# Patient Record
Sex: Male | Born: 1965 | Race: White | Hispanic: No | Smoking: Current every day smoker
Health system: Southern US, Community
[De-identification: ages and names within clinical notes are randomized; demographics above are authoritative.]

## PROBLEM LIST (undated history)

## (undated) ENCOUNTER — Emergency Department (HOSPITAL_COMMUNITY)

## (undated) DIAGNOSIS — T783XXA Angioneurotic edema, initial encounter: Secondary | ICD-10-CM

## (undated) DIAGNOSIS — F32A Depression, unspecified: Secondary | ICD-10-CM

## (undated) DIAGNOSIS — I219 Acute myocardial infarction, unspecified: Secondary | ICD-10-CM

## (undated) DIAGNOSIS — R519 Headache, unspecified: Secondary | ICD-10-CM

## (undated) DIAGNOSIS — R6881 Early satiety: Secondary | ICD-10-CM

## (undated) DIAGNOSIS — M25559 Pain in unspecified hip: Secondary | ICD-10-CM

## (undated) DIAGNOSIS — I10 Essential (primary) hypertension: Secondary | ICD-10-CM

## (undated) DIAGNOSIS — M1990 Unspecified osteoarthritis, unspecified site: Secondary | ICD-10-CM

## (undated) DIAGNOSIS — M549 Dorsalgia, unspecified: Secondary | ICD-10-CM

## (undated) DIAGNOSIS — M1611 Unilateral primary osteoarthritis, right hip: Secondary | ICD-10-CM

## (undated) DIAGNOSIS — T884XXA Failed or difficult intubation, initial encounter: Secondary | ICD-10-CM

## (undated) DIAGNOSIS — M5030 Other cervical disc degeneration, unspecified cervical region: Secondary | ICD-10-CM

## (undated) DIAGNOSIS — I2510 Atherosclerotic heart disease of native coronary artery without angina pectoris: Secondary | ICD-10-CM

## (undated) DIAGNOSIS — K760 Fatty (change of) liver, not elsewhere classified: Secondary | ICD-10-CM

## (undated) DIAGNOSIS — T464X5A Adverse effect of angiotensin-converting-enzyme inhibitors, initial encounter: Secondary | ICD-10-CM

## (undated) DIAGNOSIS — F419 Anxiety disorder, unspecified: Secondary | ICD-10-CM

## (undated) DIAGNOSIS — J45909 Unspecified asthma, uncomplicated: Secondary | ICD-10-CM

## (undated) DIAGNOSIS — E119 Type 2 diabetes mellitus without complications: Secondary | ICD-10-CM

## (undated) DIAGNOSIS — G4730 Sleep apnea, unspecified: Secondary | ICD-10-CM

## (undated) DIAGNOSIS — M542 Cervicalgia: Secondary | ICD-10-CM

## (undated) DIAGNOSIS — K219 Gastro-esophageal reflux disease without esophagitis: Secondary | ICD-10-CM

## (undated) DIAGNOSIS — I509 Heart failure, unspecified: Secondary | ICD-10-CM

## (undated) DIAGNOSIS — F329 Major depressive disorder, single episode, unspecified: Secondary | ICD-10-CM

## (undated) DIAGNOSIS — M5020 Other cervical disc displacement, unspecified cervical region: Secondary | ICD-10-CM

## (undated) DIAGNOSIS — R0600 Dyspnea, unspecified: Secondary | ICD-10-CM

## (undated) DIAGNOSIS — D649 Anemia, unspecified: Secondary | ICD-10-CM

## (undated) DIAGNOSIS — J449 Chronic obstructive pulmonary disease, unspecified: Secondary | ICD-10-CM

## (undated) DIAGNOSIS — H04129 Dry eye syndrome of unspecified lacrimal gland: Secondary | ICD-10-CM

## (undated) DIAGNOSIS — H6690 Otitis media, unspecified, unspecified ear: Secondary | ICD-10-CM

## (undated) DIAGNOSIS — F1910 Other psychoactive substance abuse, uncomplicated: Secondary | ICD-10-CM

## (undated) DIAGNOSIS — E785 Hyperlipidemia, unspecified: Secondary | ICD-10-CM

## (undated) DIAGNOSIS — R011 Cardiac murmur, unspecified: Secondary | ICD-10-CM

## (undated) DIAGNOSIS — R091 Pleurisy: Secondary | ICD-10-CM

## (undated) DIAGNOSIS — R51 Headache: Secondary | ICD-10-CM

## (undated) DIAGNOSIS — I455 Other specified heart block: Secondary | ICD-10-CM

## (undated) DIAGNOSIS — T7840XA Allergy, unspecified, initial encounter: Secondary | ICD-10-CM

## (undated) HISTORY — DX: Essential (primary) hypertension: I10

## (undated) HISTORY — PX: INCISION / DRAINAGE HAND / FINGER: SUR695

## (undated) HISTORY — DX: Cardiac murmur, unspecified: R01.1

## (undated) HISTORY — DX: Unilateral primary osteoarthritis, right hip: M16.11

## (undated) HISTORY — DX: Hyperlipidemia, unspecified: E78.5

## (undated) HISTORY — DX: Adverse effect of angiotensin-converting-enzyme inhibitors, initial encounter: T46.4X5A

## (undated) HISTORY — DX: Other psychoactive substance abuse, uncomplicated: F19.10

## (undated) HISTORY — DX: Sleep apnea, unspecified: G47.30

## (undated) HISTORY — DX: Angioneurotic edema, initial encounter: T78.3XXA

## (undated) HISTORY — DX: Dry eye syndrome of unspecified lacrimal gland: H04.129

## (undated) HISTORY — DX: Allergy, unspecified, initial encounter: T78.40XA

## (undated) HISTORY — DX: Early satiety: R68.81

## (undated) HISTORY — DX: Adverse effect of angiotensin-converting-enzyme inhibitors, initial encounter: T78.3XXA

## (undated) HISTORY — DX: Unspecified asthma, uncomplicated: J45.909

---

## 2001-11-03 ENCOUNTER — Encounter: Payer: Self-pay | Admitting: Family Medicine

## 2001-11-03 ENCOUNTER — Ambulatory Visit (HOSPITAL_COMMUNITY): Admission: RE | Admit: 2001-11-03 | Discharge: 2001-11-03 | Payer: Self-pay | Admitting: Family Medicine

## 2005-08-25 ENCOUNTER — Ambulatory Visit: Payer: Self-pay | Admitting: Psychiatry

## 2005-08-25 ENCOUNTER — Inpatient Hospital Stay (HOSPITAL_COMMUNITY): Admission: EM | Admit: 2005-08-25 | Discharge: 2005-08-28 | Payer: Self-pay | Admitting: Psychiatry

## 2006-06-20 ENCOUNTER — Emergency Department (HOSPITAL_COMMUNITY): Admission: EM | Admit: 2006-06-20 | Discharge: 2006-06-20 | Payer: Self-pay | Admitting: Emergency Medicine

## 2006-06-21 ENCOUNTER — Inpatient Hospital Stay (HOSPITAL_COMMUNITY): Admission: AD | Admit: 2006-06-21 | Discharge: 2006-06-24 | Payer: Self-pay | Admitting: Psychiatry

## 2006-06-21 ENCOUNTER — Ambulatory Visit: Payer: Self-pay | Admitting: Psychiatry

## 2011-08-24 ENCOUNTER — Emergency Department (HOSPITAL_COMMUNITY)
Admission: EM | Admit: 2011-08-24 | Discharge: 2011-08-24 | Disposition: A | Discharge status: Home / Self Care | Payer: BC Managed Care – PPO | Attending: Emergency Medicine | Admitting: Emergency Medicine

## 2011-08-24 ENCOUNTER — Other Ambulatory Visit: Payer: Self-pay

## 2011-08-24 DIAGNOSIS — M436 Torticollis: Secondary | ICD-10-CM | POA: Insufficient documentation

## 2011-08-24 DIAGNOSIS — Z136 Encounter for screening for cardiovascular disorders: Secondary | ICD-10-CM | POA: Insufficient documentation

## 2011-08-24 DIAGNOSIS — F17200 Nicotine dependence, unspecified, uncomplicated: Secondary | ICD-10-CM | POA: Insufficient documentation

## 2011-08-24 MED ORDER — CYCLOBENZAPRINE HCL 10 MG PO TABS: 10.0000 mg | ORAL_TABLET | Freq: Three times a day (TID) | ORAL | Status: AC | PRN

## 2011-08-24 MED ORDER — OXYCODONE-ACETAMINOPHEN 5-325 MG PO TABS
1.0000 | ORAL_TABLET | Freq: Once | ORAL | Status: AC
  Administered 2011-08-24: 1 via ORAL
  Filled 2011-08-24: qty 1

## 2011-08-24 MED ORDER — NAPROXEN 500 MG PO TABS: 500.0000 mg | ORAL_TABLET | Freq: Two times a day (BID) | ORAL | Status: AC

## 2011-08-24 MED ORDER — CYCLOBENZAPRINE HCL 10 MG PO TABS
10.0000 mg | ORAL_TABLET | Freq: Once | ORAL | Status: AC
  Administered 2011-08-24: 10 mg via ORAL
  Filled 2011-08-24: qty 1

## 2011-08-24 MED ORDER — OXYCODONE-ACETAMINOPHEN 5-325 MG PO TABS: 1.0000 | ORAL_TABLET | ORAL | Status: AC | PRN

## 2011-08-24 NOTE — ED Provider Notes (Signed)
History     CSN: [id number]  Arrival date & time 08/24/11  1629   First MD Initiated Contact with Patient 08/24/11 1746      Chief Complaint  Patient presents with  . Neck Pain    (Consider location/radiation/quality/duration/timing/severity/associated sxs/prior treatment) HPI Comments: Patient c/o pain to the right neck for several days.  States the pain began after he was shoveling.  Also states the pain radiates to his right jaw. Pain is worsened by movement of the arm or rotation of his neck.  HE denies chest pain, sweating, nausea or shortness of breath  Patient is a 45 y.o. male presenting with neck injury. The history is provided by the patient.  Neck Injury This is a new problem. The current episode started in the past 7 days. The problem occurs constantly. The problem has been unchanged. Associated symptoms include arthralgias and neck pain. Pertinent negatives include no abdominal pain, chest pain, congestion, fever, headaches, joint swelling, nausea, numbness, rash, sore throat, vomiting or weakness. Exacerbated by: movement and palpation. He has tried nothing for the symptoms. The treatment provided no relief.    History reviewed. No pertinent past medical history.  History reviewed. No pertinent past surgical history.  History reviewed. No pertinent family history.  History  Substance Use Topics  . Smoking status: Current Everyday Smoker -- 1.0 packs/day  . Smokeless tobacco: Not on file  . Alcohol Use: Yes      Review of Systems  Constitutional: Negative for fever and appetite change.  HENT: Positive for neck pain. Negative for congestion, sore throat and facial swelling.   Eyes: Negative for visual disturbance.  Cardiovascular: Negative for chest pain.  Gastrointestinal: Negative for nausea, vomiting and abdominal pain.  Musculoskeletal: Positive for arthralgias. Negative for back pain and joint swelling.  Skin: Negative for rash.  Neurological: Negative  for dizziness, facial asymmetry, weakness, numbness and headaches.  All other systems reviewed and are negative.    Allergies  Review of patient's allergies indicates no known allergies.  Home Medications  No current outpatient prescriptions on file.  BP 151/93  Pulse 77  Temp(Src) 98.1 F (36.7 C) (Oral)  Resp 20  Ht 5\' 6"  (1.676 m)  Wt 175 lb (79.379 kg)  BMI 28.25 kg/m2  SpO2 95%  Physical Exam  Nursing note and vitals reviewed. Constitutional: He is oriented to person, place, and time. He appears well-developed and well-nourished. No distress.  HENT:  Head: Normocephalic and atraumatic.  Mouth/Throat: Oropharynx is clear and moist.  Eyes: EOM are normal. Pupils are equal, round, and reactive to light.  Neck: Normal range of motion. Neck supple. Muscular tenderness present. No spinous process tenderness present. No rigidity. No edema, no erythema and normal range of motion present. No Brudzinski's sign and no Kernig's sign noted.  Cardiovascular: Normal rate, regular rhythm and normal heart sounds.   Pulmonary/Chest: Effort normal and breath sounds normal. No respiratory distress. He exhibits no tenderness.  Abdominal: Soft. He exhibits no distension. There is no tenderness.  Musculoskeletal: Normal range of motion. He exhibits tenderness. He exhibits no edema.       Cervical back: He exhibits tenderness. He exhibits normal range of motion, no bony tenderness, no swelling, no laceration, no spasm and normal pulse.       Back:  Lymphadenopathy:    He has no cervical adenopathy.  Neurological: He is alert and oriented to person, place, and time. No cranial nerve deficit. He exhibits normal muscle tone. Coordination normal.  Skin:  Skin is warm and dry.    ED Course  Procedures (including critical care time)          MDM     Date: 08/24/2011  Rate: 66  Rhythm: normal sinus rhythm  QRS Axis: normal  Intervals: normal  ST/T Wave abnormalities: normal   Conduction Disutrbances:none  Narrative Interpretation:   Old EKG Reviewed: none available   EKG read by Dr. Effie Shy   Patient has ttp of the right lateral neck.  Pain is also reproduced with abduction of the right arm.  No focal neuro deficits on exam.  No chest pain. Likely torticollis.  Randy Hebert L. [place], [place] 08/27/11 2048

## 2011-08-24 NOTE — ED Notes (Signed)
Pt a/ox4. Resp even and unlabored. NAD at this time. D/C instructions reviewed with pt. Pt verbalized understanding. Pt ambulated to lobby with steady gate.  

## 2011-08-24 NOTE — ED Notes (Signed)
Pt presents with neck pain. Pt denies injury but states it feels like a pulled muscle.

## 2011-08-28 NOTE — ED Provider Notes (Signed)
Medical screening examination/treatment/procedure(s) were performed by non-physician practitioner and as supervising physician I was immediately available for consultation/collaboration.  Jeramyah Goodpasture L Kosei Rhodes, MD 08/28/11 0103 

## 2013-04-20 ENCOUNTER — Emergency Department (HOSPITAL_COMMUNITY)
Admission: EM | Admit: 2013-04-20 | Discharge: 2013-04-20 | Disposition: A | Discharge status: Home / Self Care | Payer: Self-pay | Attending: Emergency Medicine | Admitting: Emergency Medicine

## 2013-04-20 ENCOUNTER — Encounter (HOSPITAL_COMMUNITY): Payer: Self-pay | Admitting: *Deleted

## 2013-04-20 DIAGNOSIS — F17200 Nicotine dependence, unspecified, uncomplicated: Secondary | ICD-10-CM | POA: Insufficient documentation

## 2013-04-20 DIAGNOSIS — H6690 Otitis media, unspecified, unspecified ear: Secondary | ICD-10-CM | POA: Insufficient documentation

## 2013-04-20 DIAGNOSIS — L299 Pruritus, unspecified: Secondary | ICD-10-CM | POA: Insufficient documentation

## 2013-04-20 DIAGNOSIS — H9190 Unspecified hearing loss, unspecified ear: Secondary | ICD-10-CM | POA: Insufficient documentation

## 2013-04-20 DIAGNOSIS — H9210 Otorrhea, unspecified ear: Secondary | ICD-10-CM | POA: Insufficient documentation

## 2013-04-20 DIAGNOSIS — IMO0002 Reserved for concepts with insufficient information to code with codable children: Secondary | ICD-10-CM | POA: Insufficient documentation

## 2013-04-20 DIAGNOSIS — H6691 Otitis media, unspecified, right ear: Secondary | ICD-10-CM

## 2013-04-20 DIAGNOSIS — Y9289 Other specified places as the place of occurrence of the external cause: Secondary | ICD-10-CM | POA: Insufficient documentation

## 2013-04-20 DIAGNOSIS — Y9389 Activity, other specified: Secondary | ICD-10-CM | POA: Insufficient documentation

## 2013-04-20 MED ORDER — AMOXICILLIN 500 MG PO CAPS: 500.0000 mg | ORAL_CAPSULE | Freq: Three times a day (TID) | ORAL | Status: DC

## 2013-04-20 MED ORDER — HYDROCODONE-ACETAMINOPHEN 5-325 MG PO TABS
2.0000 | ORAL_TABLET | Freq: Once | ORAL | Status: AC
  Administered 2013-04-20: 2 via ORAL
  Filled 2013-04-20: qty 2

## 2013-04-20 MED ORDER — HYDROCODONE-ACETAMINOPHEN 5-325 MG PO TABS: ORAL_TABLET | ORAL | Status: DC

## 2013-04-20 MED ORDER — AMOXICILLIN 250 MG PO CAPS
500.0000 mg | ORAL_CAPSULE | Freq: Once | ORAL | Status: AC
  Administered 2013-04-20: 500 mg via ORAL
  Filled 2013-04-20: qty 2

## 2013-04-20 NOTE — ED Provider Notes (Signed)
CSN: [id number]     Arrival date & time 04/20/13  0125 History     First MD Initiated Contact with Patient 04/20/13 0146     Chief Complaint  Patient presents with  . Otalgia    right earache for 1 week.   (Consider location/radiation/quality/duration/timing/severity/associated sxs/prior Treatment) HPI  Patient states a week ago he had been wearing ear plugs for work and had itching of his ear canals. He states he was using a Q-tip to scratch his ear canal and accidentally went too deep on the right side and felt a sharp pain and then saw some blood on his Q-tip. He states now the right ear is draining some clear/brown fluid that smells. He states he has pain in his right ear that is throbbing and intermittently he gets a sharp jab every 10-15 minutes it goes into his forehead. He denies any fever. He has noticed some decreased hearing in his right ear. He denies any pain with swallowing. He also states he notices his pain is worse when he lays down.  PCP Dr Sudie Bailey  History reviewed. No pertinent past medical history. History reviewed. No pertinent past surgical history. No family history on file. History  Substance Use Topics  . Smoking status: Current Every Day Smoker -- 1.00 packs/day  . Smokeless tobacco: Not on file  . Alcohol Use: Yes  employed  Review of Systems  All other systems reviewed and are negative.    Allergies  Review of patient's allergies indicates no known allergies.  Home Medications  none  BP 159/77  Pulse 87  Temp(Src) 98.3 F (36.8 C) (Oral)  Resp 20  Ht 5\' 6"  (1.676 m)  Wt 205 lb (92.987 kg)  BMI 33.1 kg/m2  SpO2 97%  Vital signs normal   Physical Exam  Nursing note and vitals reviewed. Constitutional: He is oriented to person, place, and time. He appears well-developed and well-nourished.  Non-toxic appearance. He does not appear ill. No distress.  HENT:  Head: Normocephalic and atraumatic.  Right Ear: External ear normal. Decreased  hearing is noted.  Left Ear: Hearing and external ear normal.  Nose: Nose normal. No mucosal edema or rhinorrhea.  Mouth/Throat: Oropharynx is clear and moist and mucous membranes are normal. No dental abscesses or edematous.  Pt has bilateral cauliflower ears. His canal is very angular. The left TM is normal, no obvious swelling or exudate in the canal. The right TM is bulging with a lot of white opaque purulent appearing fluid with air fluid level about 2/3 up the TM present with mild bulging. Canal does not appear to be swollen.    Eyes: Conjunctivae and EOM are normal. Pupils are equal, round, and reactive to light.  Neck: Normal range of motion and full passive range of motion without pain. Neck supple.  Pulmonary/Chest: Effort normal. No respiratory distress. He has no rhonchi. He exhibits no crepitus.  Abdominal: Normal appearance.  Musculoskeletal: Normal range of motion. He exhibits no edema and no tenderness.  Moves all extremities well.   Neurological: He is alert and oriented to person, place, and time. He has normal strength. No cranial nerve deficit.  Skin: Skin is warm, dry and intact. No rash noted. No erythema. No pallor.  Psychiatric: He has a normal mood and affect. His speech is normal and behavior is normal. His mood appears not anxious.    ED Course   Medications  HYDROcodone-acetaminophen (NORCO/VICODIN) 5-325 MG per tablet 2 tablet (not administered)  amoxicillin (AMOXIL) capsule  500 mg (not administered)     Procedures (including critical care time)  1. Otitis media, right    New Prescriptions   AMOXICILLIN (AMOXIL) 500 MG CAPSULE    Take 1 capsule (500 mg total) by mouth 3 (three) times daily.   HYDROCODONE-ACETAMINOPHEN (NORCO/VICODIN) 5-325 MG PER TABLET    Take 1 or 2 po Q 6hrs for pain    Plan discharge   Devoria Albe, MD, Armando Gang   MDM    Ward Givens, MD 04/20/13 [phone]

## 2013-04-20 NOTE — ED Notes (Signed)
Patient states he had drainage from his ear and put a q-tip in it. States he went in too far and there was blood on the end of the q-tip

## 2013-06-20 ENCOUNTER — Emergency Department (HOSPITAL_COMMUNITY): Payer: BC Managed Care – PPO

## 2013-06-20 ENCOUNTER — Emergency Department (HOSPITAL_COMMUNITY)
Admission: EM | Admit: 2013-06-20 | Discharge: 2013-06-20 | Disposition: A | Discharge status: Home / Self Care | Payer: BC Managed Care – PPO | Attending: Emergency Medicine | Admitting: Emergency Medicine

## 2013-06-20 ENCOUNTER — Encounter (HOSPITAL_COMMUNITY): Payer: Self-pay | Admitting: Emergency Medicine

## 2013-06-20 DIAGNOSIS — F17200 Nicotine dependence, unspecified, uncomplicated: Secondary | ICD-10-CM | POA: Insufficient documentation

## 2013-06-20 DIAGNOSIS — M5030 Other cervical disc degeneration, unspecified cervical region: Secondary | ICD-10-CM | POA: Insufficient documentation

## 2013-06-20 DIAGNOSIS — J329 Chronic sinusitis, unspecified: Secondary | ICD-10-CM | POA: Insufficient documentation

## 2013-06-20 DIAGNOSIS — R51 Headache: Secondary | ICD-10-CM | POA: Insufficient documentation

## 2013-06-20 DIAGNOSIS — R635 Abnormal weight gain: Secondary | ICD-10-CM | POA: Insufficient documentation

## 2013-06-20 DIAGNOSIS — R209 Unspecified disturbances of skin sensation: Secondary | ICD-10-CM | POA: Insufficient documentation

## 2013-06-20 HISTORY — DX: Otitis media, unspecified, unspecified ear: H66.90

## 2013-06-20 HISTORY — DX: Cervicalgia: M54.2

## 2013-06-20 LAB — CBC WITH DIFFERENTIAL/PLATELET
Basophils Absolute: 0 10*3/uL (ref 0.0–0.1)
HCT: 44.2 % (ref 39.0–52.0)
Hemoglobin: 15.1 g/dL (ref 13.0–17.0)
Lymphocytes Relative: 32 % (ref 12–46)
Monocytes Absolute: 0.6 10*3/uL (ref 0.1–1.0)
Neutro Abs: 4.5 10*3/uL (ref 1.7–7.7)
RDW: 13.5 % (ref 11.5–15.5)
WBC: 7.8 10*3/uL (ref 4.0–10.5)

## 2013-06-20 LAB — BASIC METABOLIC PANEL
Chloride: 104 mEq/L (ref 96–112)
GFR calc Af Amer: >90 mL/min (ref >90)
Potassium: 3.8 mEq/L (ref 3.5–5.1)

## 2013-06-20 MED ORDER — AMOXICILLIN-POT CLAVULANATE 875-125 MG PO TABS: 1.0000 | ORAL_TABLET | Freq: Two times a day (BID) | ORAL | Status: DC

## 2013-06-20 MED ORDER — CYCLOBENZAPRINE HCL 10 MG PO TABS: 10.0000 mg | ORAL_TABLET | Freq: Two times a day (BID) | ORAL | Status: DC | PRN

## 2013-06-20 MED ORDER — NAPROXEN 500 MG PO TABS: 500.0000 mg | ORAL_TABLET | Freq: Two times a day (BID) | ORAL | Status: DC

## 2013-06-20 MED ORDER — OXYCODONE-ACETAMINOPHEN 5-325 MG PO TABS: 2.0000 | ORAL_TABLET | ORAL | Status: DC | PRN

## 2013-06-20 NOTE — ED Provider Notes (Signed)
CSN: [id number]     Arrival date & time 06/20/13  0865 History  This chart was scribed for Donnetta Hutching, MD by Bennett Scrape, ED Scribe. This patient was seen in room APA10/APA10 and the patient's care was started at 10:19 AM.   Chief Complaint  Patient presents with  . Migraine    The history is provided by the patient. No language interpreter was used.    HPI Comments: Randy Hebert is a 47 y.o. male who presents to the Emergency Department complaining of HA located in the occipital region that radiates around diffusely and into the neck that has been persistent for the past 2 weeks. The pain is described as a tightness. He reports mild improvement with Goody powders. He denies any extensive lifting or neck strain at work. He states that he developed intermittent paresthesias in bilateral arms with laying on the side that started yesterday which caused him to be evaluated today. He states that the symptoms started after approximately 5 minutes after laying down. He is unsure how long the symptoms lasted but admits that they improved with taking the pressure off of the extremities. He admits that he used to having similar episodes several years ago that resolved. He also reports paresthesias in his neck that usually started with laying down over the past few months. He denies any recent episodes. He admits that he feels a "creaking" noise in his neck with turning his head.  Pt works in a [employer], works third shift. Worked 30 years on first, switched to second shift one year ago, got laid off for 7 months and came back on third. He reports a 15 to 20 lb weight gain in the 7 months of unemployment.   No current PCP, states [facility] and Sudie Bailey have "blacklisted" him. No recent blood work. Reports prior problems with low "good cholesterol" and prior elevated CBGs (291) but denies any HLD or DM diagnoses. Has appointment with Dr. Dwana Melena on 08/23/13.   Past Medical History   Diagnosis Date  . Neck pain   . Otitis media    Past Surgical History  Procedure Laterality Date  . Incision / drainage hand / finger     History reviewed. No pertinent family history. History  Substance Use Topics  . Smoking status: Current Every Day Smoker -- 1.00 packs/day  . Smokeless tobacco: Not on file  . Alcohol Use: Yes    Review of Systems  A complete 10 system review of systems was obtained and all systems are negative except as noted in the HPI and PMH.   Allergies  Review of patient's allergies indicates no known allergies.  Home Medications   Current Outpatient Rx  Name  Route  Sig  Dispense  Refill  . Aspirin-Acetaminophen (GOODYS BODY PAIN PO)   Oral   Take 1 packet by mouth daily as needed (headache).          Triage Vitals: BP 140/85  Pulse 87  Temp(Src) 98.4 F (36.9 C) (Oral)  Resp 16  Ht 5\' 6"  (1.676 m)  Wt 198 lb 9.6 oz (90.084 kg)  BMI 32.07 kg/m2  SpO2 99%  Physical Exam  Nursing note and vitals reviewed. Constitutional: He is oriented to person, place, and time. He appears well-developed and well-nourished.  HENT:  Head: Normocephalic and atraumatic.  Eyes: Conjunctivae and EOM are normal. Pupils are equal, round, and reactive to light.  Neck: Normal range of motion. Neck supple.  Cardiovascular: Normal rate, regular  rhythm and normal heart sounds.   Pulmonary/Chest: Effort normal and breath sounds normal.  Abdominal: Soft. Bowel sounds are normal.  Musculoskeletal: Normal range of motion.  Neurological: He is alert and oriented to person, place, and time.  Skin: Skin is warm and dry.  Psychiatric: He has a normal mood and affect.    ED Course  Procedures (including critical care time)  DIAGNOSTIC STUDIES: Oxygen Saturation is 99% on room air, normal by my interpretation.    COORDINATION OF CARE: 10:26 AM-Discussed treatment plan which includes CT of head and c-spine, CBC panel, CMP, UA) with pt at bedside and pt agreed to  plan.   Labs Review Labs Reviewed  BASIC METABOLIC PANEL - Abnormal; Notable for the following:    Glucose, Bld 102 (*)    All other components within normal limits  CBC WITH DIFFERENTIAL  Ct Head Wo Contrast  06/20/2013   CLINICAL DATA:  Headaches with neck pain and arm tingling  EXAM: CT HEAD WITHOUT CONTRAST  CT CERVICAL SPINE WITHOUT CONTRAST  TECHNIQUE: Multidetector CT imaging of the head and cervical spine was performed following the standard protocol without intravenous contrast. Multiplanar CT image reconstructions of the cervical spine were also generated. The study was obtained within 24 hr of patient's arrival at the emergency department.  COMPARISON:  None.  FINDINGS: CT HEAD FINDINGS  Ventricles are normal in size and configuration. There is no mass, hemorrhage, extra-axial fluid collection, or midline shift. Gray-white compartments are normal. No acute infarct demonstrable.  Bony calvarium appears intact. The mastoid air cells are clear. There is mucosal thickening in the left maxillary antrum.  CT CERVICAL SPINE FINDINGS  There is no fracture or spondylolisthesis. Prevertebral soft tissues and predental space regions are normal.  There is moderately severe disc space narrowing at C5-6 and C6-7. There is moderate disc space narrowing at the 4 5. The there are anterior and posterior osteophytes at C5 and C6.  There is exit foraminal narrowing due to bony hypertrophy at C2-3 on the right, C3-4 on the left, C4-5 on the left, C5-6 bilaterally, and C6-7 bilaterally. There is no frank disc extrusion or stenosis.  IMPRESSION: CT head: Left maxillary sinus disease. Study otherwise unremarkable.  CT cervical spine: Multilevel osteoarthritic change. No fracture or spondylolisthesis.   Electronically Signed   By: Bretta Bang M.D.   On: 06/20/2013 12:07   Imaging Review No results found.  EKG Interpretation   None       MDM  No diagnosis found. No frank neurological deficits. CT  cervical spine reveals multiple anomalies.  This could explain his hand numbness.  Additionally he appears to have left maxillary sinus disease.   Rx Augmentin 875, Percocet, Flexeril 10 mg, Naprosyn 500 mg  I personally performed the services described in this documentation, which was scribed in my presence. The recorded information has been reviewed and is accurate.    Donnetta Hutching, MD 06/20/13 [phone]

## 2013-06-20 NOTE — ED Notes (Signed)
Neck pain w/arm tingling and headaches intermittently x 6 months.  This headache has lasted x 2 weeks.  No known injury.

## 2013-06-20 NOTE — ED Notes (Signed)
Patient back from CT.

## 2015-01-31 HISTORY — PX: CORONARY ANGIOPLASTY WITH STENT PLACEMENT: SHX49

## 2015-02-06 ENCOUNTER — Emergency Department (HOSPITAL_COMMUNITY): Payer: Self-pay

## 2015-02-06 ENCOUNTER — Encounter (HOSPITAL_COMMUNITY): Payer: Self-pay | Admitting: Emergency Medicine

## 2015-02-06 ENCOUNTER — Inpatient Hospital Stay (HOSPITAL_COMMUNITY)
Admission: EM | Admit: 2015-02-06 | Discharge: 2015-02-09 | DRG: 247 | Disposition: A | Discharge status: Home / Self Care | Payer: Self-pay | Attending: Cardiology | Admitting: Cardiology

## 2015-02-06 ENCOUNTER — Encounter (HOSPITAL_COMMUNITY)
Admission: EM | Disposition: A | Discharge status: Home / Self Care | Payer: Self-pay | Source: Home / Self Care | Attending: Cardiology

## 2015-02-06 ENCOUNTER — Inpatient Hospital Stay (HOSPITAL_COMMUNITY): Payer: Self-pay

## 2015-02-06 DIAGNOSIS — I2510 Atherosclerotic heart disease of native coronary artery without angina pectoris: Secondary | ICD-10-CM | POA: Diagnosis present

## 2015-02-06 DIAGNOSIS — Z79899 Other long term (current) drug therapy: Secondary | ICD-10-CM

## 2015-02-06 DIAGNOSIS — I2129 ST elevation (STEMI) myocardial infarction involving other sites: Principal | ICD-10-CM | POA: Diagnosis present

## 2015-02-06 DIAGNOSIS — F17290 Nicotine dependence, other tobacco product, uncomplicated: Secondary | ICD-10-CM | POA: Diagnosis present

## 2015-02-06 DIAGNOSIS — Z6833 Body mass index (BMI) 33.0-33.9, adult: Secondary | ICD-10-CM

## 2015-02-06 DIAGNOSIS — G4733 Obstructive sleep apnea (adult) (pediatric): Secondary | ICD-10-CM | POA: Diagnosis present

## 2015-02-06 DIAGNOSIS — Z8249 Family history of ischemic heart disease and other diseases of the circulatory system: Secondary | ICD-10-CM

## 2015-02-06 DIAGNOSIS — E669 Obesity, unspecified: Secondary | ICD-10-CM | POA: Diagnosis present

## 2015-02-06 DIAGNOSIS — I213 ST elevation (STEMI) myocardial infarction of unspecified site: Secondary | ICD-10-CM

## 2015-02-06 DIAGNOSIS — T447X5A Adverse effect of beta-adrenoreceptor antagonists, initial encounter: Secondary | ICD-10-CM | POA: Diagnosis not present

## 2015-02-06 DIAGNOSIS — I10 Essential (primary) hypertension: Secondary | ICD-10-CM

## 2015-02-06 DIAGNOSIS — I455 Other specified heart block: Secondary | ICD-10-CM | POA: Diagnosis not present

## 2015-02-06 DIAGNOSIS — I25110 Atherosclerotic heart disease of native coronary artery with unstable angina pectoris: Secondary | ICD-10-CM

## 2015-02-06 DIAGNOSIS — Z823 Family history of stroke: Secondary | ICD-10-CM

## 2015-02-06 HISTORY — DX: Atherosclerotic heart disease of native coronary artery without angina pectoris: I25.10

## 2015-02-06 HISTORY — PX: CARDIAC CATHETERIZATION: SHX172

## 2015-02-06 HISTORY — DX: Other specified heart block: I45.5

## 2015-02-06 HISTORY — DX: Pleurisy: R09.1

## 2015-02-06 LAB — COMPREHENSIVE METABOLIC PANEL
ALBUMIN: 3.8 g/dL (ref 3.5–5.0)
ALK PHOS: 93 U/L (ref 38–126)
ALT: 22 U/L (ref 17–63)
ANION GAP: 11 (ref 5–15)
AST: 19 U/L (ref 15–41)
BUN: 20 mg/dL (ref 6–20)
CALCIUM: 8.7 mg/dL — AB (ref 8.9–10.3)
CO2: 24 mmol/L (ref 22–32)
Chloride: 105 mmol/L (ref 101–111)
Creatinine, Ser: 0.95 mg/dL (ref 0.61–1.24)
Glucose, Bld: 108 mg/dL — ABNORMAL HIGH (ref 65–99)
Potassium: 3.9 mmol/L (ref 3.5–5.1)
SODIUM: 140 mmol/L (ref 135–145)
Total Bilirubin: 0.3 mg/dL (ref 0.3–1.2)
Total Protein: 6.9 g/dL (ref 6.5–8.1)

## 2015-02-06 LAB — TROPONIN I
TROPONIN I: 26.61 ng/mL — AB (ref <0.031)
Troponin I: <0.03 ng/mL (ref <0.031)
Troponin I: 0.84 ng/mL (ref <0.031)
Troponin I: 23.91 ng/mL

## 2015-02-06 LAB — CBC WITH DIFFERENTIAL/PLATELET
BASOS PCT: 1 % (ref 0–1)
Basophils Absolute: 0.1 10*3/uL (ref 0.0–0.1)
EOS ABS: 0.4 10*3/uL (ref 0.0–0.7)
EOS PCT: 4 % (ref 0–5)
HEMATOCRIT: 46.8 % (ref 39.0–52.0)
Hemoglobin: 15.5 g/dL (ref 13.0–17.0)
LYMPHS ABS: 2.6 10*3/uL (ref 0.7–4.0)
LYMPHS PCT: 27 % (ref 12–46)
MCH: 29.2 pg (ref 26.0–34.0)
MCHC: 33.1 g/dL (ref 30.0–36.0)
MCV: 88.3 fL (ref 78.0–100.0)
MONO ABS: 1.1 10*3/uL — AB (ref 0.1–1.0)
Monocytes Relative: 12 % (ref 3–12)
NEUTROS ABS: 5.5 10*3/uL (ref 1.7–7.7)
NEUTROS PCT: 56 % (ref 43–77)
Platelets: 295 10*3/uL (ref 150–400)
RBC: 5.3 MIL/uL (ref 4.22–5.81)
RDW: 13.9 % (ref 11.5–15.5)
WBC: 9.6 10*3/uL (ref 4.0–10.5)

## 2015-02-06 LAB — PROTIME-INR
INR: 1.2 (ref 0.00–1.49)
Prothrombin Time: 15.4 seconds — ABNORMAL HIGH (ref 11.6–15.2)

## 2015-02-06 LAB — MAGNESIUM: MAGNESIUM: 1.8 mg/dL (ref 1.7–2.4)

## 2015-02-06 LAB — POCT ACTIVATED CLOTTING TIME: ACTIVATED CLOTTING TIME: 546 s

## 2015-02-06 LAB — MRSA PCR SCREENING: MRSA by PCR: NEGATIVE

## 2015-02-06 LAB — APTT: aPTT: 29 seconds (ref 24–37)

## 2015-02-06 SURGERY — LEFT HEART CATH AND CORONARY ANGIOGRAPHY

## 2015-02-06 MED ORDER — HEPARIN SODIUM (PORCINE) 1000 UNIT/ML IJ SOLN
INTRAMUSCULAR | Status: AC
  Filled 2015-02-06: qty 1

## 2015-02-06 MED ORDER — NITROGLYCERIN 2 % TD OINT
TOPICAL_OINTMENT | TRANSDERMAL | Status: AC
  Administered 2015-02-06: 1 [in_us] via TOPICAL
  Filled 2015-02-06: qty 1

## 2015-02-06 MED ORDER — NITROGLYCERIN IN D5W 200-5 MCG/ML-% IV SOLN
5.0000 ug/min | Freq: Once | INTRAVENOUS | Status: AC
Start: 2015-02-06 — End: 2015-02-06
  Administered 2015-02-06: 5 ug/min via INTRAVENOUS
  Filled 2015-02-06: qty 250

## 2015-02-06 MED ORDER — FENTANYL CITRATE (PF) 100 MCG/2ML IJ SOLN
INTRAMUSCULAR | Status: AC
  Filled 2015-02-06: qty 2

## 2015-02-06 MED ORDER — VERAPAMIL HCL 2.5 MG/ML IV SOLN
INTRAVENOUS | Status: DC | PRN
  Administered 2015-02-06: 06:00:00 via INTRA_ARTERIAL

## 2015-02-06 MED ORDER — LABETALOL HCL 5 MG/ML IV SOLN
INTRAVENOUS | Status: AC
  Filled 2015-02-06: qty 4

## 2015-02-06 MED ORDER — NITROGLYCERIN IN D5W 200-5 MCG/ML-% IV SOLN
INTRAVENOUS | Status: DC | PRN
  Administered 2015-02-06: 5 ug/min via INTRAVENOUS

## 2015-02-06 MED ORDER — CARVEDILOL 3.125 MG PO TABS
3.1250 mg | ORAL_TABLET | Freq: Two times a day (BID) | ORAL | Status: DC
  Administered 2015-02-06 – 2015-02-08 (×5): 3.125 mg via ORAL
  Filled 2015-02-06 (×7): qty 1

## 2015-02-06 MED ORDER — ASPIRIN 81 MG PO CHEW: 81.0000 mg | CHEWABLE_TABLET | Freq: Every day | ORAL | Status: DC

## 2015-02-06 MED ORDER — ASPIRIN 81 MG PO CHEW
324.0000 mg | CHEWABLE_TABLET | Freq: Once | ORAL | Status: AC
  Administered 2015-02-06: 324 mg via ORAL
  Filled 2015-02-06: qty 4

## 2015-02-06 MED ORDER — OXYCODONE-ACETAMINOPHEN 5-325 MG PO TABS
2.0000 | ORAL_TABLET | ORAL | Status: DC | PRN
  Administered 2015-02-06 – 2015-02-09 (×7): 2 via ORAL
  Filled 2015-02-06 (×7): qty 2

## 2015-02-06 MED ORDER — ONDANSETRON HCL 4 MG/2ML IJ SOLN: 4.0000 mg | Freq: Four times a day (QID) | INTRAMUSCULAR | Status: DC | PRN

## 2015-02-06 MED ORDER — SODIUM CHLORIDE 0.9 % IJ SOLN
3.0000 mL | Freq: Two times a day (BID) | INTRAMUSCULAR | Status: DC
  Administered 2015-02-06 – 2015-02-09 (×6): 3 mL via INTRAVENOUS

## 2015-02-06 MED ORDER — NITROGLYCERIN 1 MG/10 ML FOR IR/CATH LAB
INTRA_ARTERIAL | Status: AC
  Filled 2015-02-06: qty 10

## 2015-02-06 MED ORDER — TICAGRELOR 90 MG PO TABS
ORAL_TABLET | ORAL | Status: DC | PRN
  Administered 2015-02-06: 180 mg via ORAL

## 2015-02-06 MED ORDER — HEPARIN SODIUM (PORCINE) 1000 UNIT/ML IJ SOLN
INTRAMUSCULAR | Status: DC | PRN
  Administered 2015-02-06: 5000 [IU] via INTRAVENOUS

## 2015-02-06 MED ORDER — SODIUM CHLORIDE 0.9 % WEIGHT BASED INFUSION: 1.0000 mL/kg/h | INTRAVENOUS | Status: AC

## 2015-02-06 MED ORDER — IOHEXOL 300 MG/ML  SOLN
INTRAMUSCULAR | Status: DC | PRN
  Administered 2015-02-06: 250 mL via INTRA_ARTERIAL

## 2015-02-06 MED ORDER — SODIUM CHLORIDE 0.9 % IV BOLUS (SEPSIS)
INTRAVENOUS | Status: DC | PRN
  Administered 2015-02-06: 250 mL via INTRAVENOUS

## 2015-02-06 MED ORDER — SODIUM CHLORIDE 0.9 % IJ SOLN: 3.0000 mL | INTRAMUSCULAR | Status: DC | PRN

## 2015-02-06 MED ORDER — HEPARIN (PORCINE) IN NACL 100-0.45 UNIT/ML-% IJ SOLN
1100.0000 [IU]/h | INTRAMUSCULAR | Status: DC
Start: 2015-02-06 — End: 2015-02-06
  Administered 2015-02-06: 1100 [IU]/h via INTRAVENOUS
  Filled 2015-02-06: qty 250

## 2015-02-06 MED ORDER — LIDOCAINE HCL (PF) 1 % IJ SOLN
INTRAMUSCULAR | Status: AC
  Filled 2015-02-06: qty 30

## 2015-02-06 MED ORDER — HEPARIN (PORCINE) IN NACL 2-0.9 UNIT/ML-% IJ SOLN
INTRAMUSCULAR | Status: AC
  Filled 2015-02-06: qty 1500

## 2015-02-06 MED ORDER — VERAPAMIL HCL 2.5 MG/ML IV SOLN
INTRAVENOUS | Status: AC
  Filled 2015-02-06: qty 2

## 2015-02-06 MED ORDER — ATORVASTATIN CALCIUM 80 MG PO TABS
80.0000 mg | ORAL_TABLET | Freq: Every day | ORAL | Status: DC
  Administered 2015-02-06 – 2015-02-08 (×3): 80 mg via ORAL
  Filled 2015-02-06 (×4): qty 1

## 2015-02-06 MED ORDER — LIDOCAINE HCL (PF) 1 % IJ SOLN
INTRAMUSCULAR | Status: DC | PRN
  Administered 2015-02-06: 5 mL via INTRADERMAL

## 2015-02-06 MED ORDER — MORPHINE SULFATE 2 MG/ML IJ SOLN: 2.0000 mg | INTRAMUSCULAR | Status: DC | PRN

## 2015-02-06 MED ORDER — METOPROLOL TARTRATE 1 MG/ML IV SOLN
5.0000 mg | Freq: Once | INTRAVENOUS | Status: AC
  Administered 2015-02-06: 5 mg via INTRAVENOUS

## 2015-02-06 MED ORDER — BIVALIRUDIN BOLUS VIA INFUSION - CUPID
INTRAVENOUS | Status: DC | PRN
  Administered 2015-02-06: 68.025 mg via INTRAVENOUS

## 2015-02-06 MED ORDER — ACETAMINOPHEN 325 MG PO TABS: 650.0000 mg | ORAL_TABLET | ORAL | Status: DC | PRN

## 2015-02-06 MED ORDER — SODIUM CHLORIDE 0.9 % IV SOLN: 250.0000 mL | INTRAVENOUS | Status: DC | PRN

## 2015-02-06 MED ORDER — ASPIRIN 81 MG PO CHEW
81.0000 mg | CHEWABLE_TABLET | Freq: Every day | ORAL | Status: DC
  Administered 2015-02-07 – 2015-02-09 (×3): 81 mg via ORAL
  Filled 2015-02-06 (×3): qty 1

## 2015-02-06 MED ORDER — MORPHINE SULFATE 4 MG/ML IJ SOLN
4.0000 mg | Freq: Once | INTRAMUSCULAR | Status: AC
  Administered 2015-02-06: 4 mg via INTRAVENOUS
  Filled 2015-02-06: qty 1

## 2015-02-06 MED ORDER — MIDAZOLAM HCL 2 MG/2ML IJ SOLN
INTRAMUSCULAR | Status: DC | PRN
  Administered 2015-02-06 (×2): 1 mg via INTRAVENOUS

## 2015-02-06 MED ORDER — PNEUMOCOCCAL VAC POLYVALENT 25 MCG/0.5ML IJ INJ
0.5000 mL | INJECTION | INTRAMUSCULAR | Status: AC
  Administered 2015-02-07: 0.5 mL via INTRAMUSCULAR
  Filled 2015-02-06: qty 0.5

## 2015-02-06 MED ORDER — BIVALIRUDIN 250 MG IV SOLR
INTRAVENOUS | Status: AC
  Filled 2015-02-06: qty 250

## 2015-02-06 MED ORDER — MIDAZOLAM HCL 2 MG/2ML IJ SOLN
INTRAMUSCULAR | Status: AC
  Filled 2015-02-06: qty 2

## 2015-02-06 MED ORDER — FENTANYL CITRATE (PF) 100 MCG/2ML IJ SOLN
INTRAMUSCULAR | Status: DC | PRN
  Administered 2015-02-06 (×2): 25 ug via INTRAVENOUS

## 2015-02-06 MED ORDER — NITROGLYCERIN 1 MG/10 ML FOR IR/CATH LAB
INTRA_ARTERIAL | Status: DC | PRN
  Administered 2015-02-06: 200 ug via INTRACORONARY

## 2015-02-06 MED ORDER — TICAGRELOR 90 MG PO TABS
90.0000 mg | ORAL_TABLET | Freq: Two times a day (BID) | ORAL | Status: DC
  Administered 2015-02-06 – 2015-02-09 (×6): 90 mg via ORAL
  Filled 2015-02-06 (×7): qty 1

## 2015-02-06 MED ORDER — SODIUM CHLORIDE 0.9 % IV SOLN
INTRAVENOUS | Status: DC
  Administered 2015-02-06: 10 mL via INTRAVENOUS

## 2015-02-06 MED ORDER — SODIUM CHLORIDE 0.9 % IV SOLN
250.0000 mg | INTRAVENOUS | Status: DC | PRN
  Administered 2015-02-06: 1.75 mg/kg/h via INTRAVENOUS

## 2015-02-06 MED ORDER — METOPROLOL TARTRATE 1 MG/ML IV SOLN
INTRAVENOUS | Status: AC
  Filled 2015-02-06: qty 5

## 2015-02-06 MED ORDER — NITROGLYCERIN 2 % TD OINT
1.0000 [in_us] | TOPICAL_OINTMENT | Freq: Four times a day (QID) | TRANSDERMAL | Status: DC
Start: 2015-02-06 — End: 2015-02-06
  Administered 2015-02-06: 1 [in_us] via TOPICAL

## 2015-02-06 MED ORDER — TICAGRELOR 90 MG PO TABS
ORAL_TABLET | ORAL | Status: AC
  Filled 2015-02-06: qty 2

## 2015-02-06 MED ORDER — HEPARIN SODIUM (PORCINE) 5000 UNIT/ML IJ SOLN
4000.0000 [IU] | INTRAMUSCULAR | Status: AC
  Administered 2015-02-06: 4000 [IU] via INTRAVENOUS
  Filled 2015-02-06: qty 1

## 2015-02-06 SURGICAL SUPPLY — 20 items
BALLN TREK RX 2.5X12 (BALLOONS) ×2
BALLOON TREK RX 2.5X12 (BALLOONS) IMPLANT
CATH INFINITI 5 FR JL3.5 (CATHETERS) ×1 IMPLANT
CATH INFINITI 5FR ANG PIGTAIL (CATHETERS) ×1 IMPLANT
CATH INFINITI JR4 5F (CATHETERS) ×1 IMPLANT
CATH LAUNCHER 5F RADR (CATHETERS) IMPLANT
CATH OPTITORQUE TIG 4.0 5F (CATHETERS) ×1 IMPLANT
CATH VISTA GUIDE 6FR XBLAD3.5 (CATHETERS) ×1 IMPLANT
CATHETER LAUNCHER 5F RADR (CATHETERS) ×2
GLIDESHEATH SLEND A-KIT 6F 22G (SHEATH) ×1 IMPLANT
HOVERMATT SINGLE USE (MISCELLANEOUS) ×1 IMPLANT
KIT ENCORE 26 ADVANTAGE (KITS) ×2 IMPLANT
KIT HEART LEFT (KITS) ×2 IMPLANT
PACK CARDIAC CATHETERIZATION (CUSTOM PROCEDURE TRAY) ×2 IMPLANT
STENT PROMUS PREM MR 2.5X16 (Permanent Stent) ×1 IMPLANT
TRANSDUCER W/STOPCOCK (MISCELLANEOUS) ×2 IMPLANT
TUBING CIL FLEX 10 FLL-RA (TUBING) ×2 IMPLANT
WIRE ASAHI PROWATER 180CM (WIRE) ×1 IMPLANT
WIRE LUGE 182CM (WIRE) ×1 IMPLANT
WIRE SAFE-T 1.5MM-J .035X260CM (WIRE) ×1 IMPLANT

## 2015-02-06 NOTE — ED Notes (Signed)
Nitropaste removed as directed by Dr. Tomi Bamberger.

## 2015-02-06 NOTE — Care Management Note (Addendum)
Case Management Note  Patient Details  Name: Randy Hebert MRN: [id number] Date of Birth: December 14, 1965  Subjective/Objective:       Adm w at [facility]             Action/Plan: lives at home   Expected Discharge Date:                  Expected Discharge Plan:  Home/Self Care  In-House Referral:     Discharge planning Services  CM Consult, Medication Assistance  Post Acute Care Choice:    Choice offered to:     DME Arranged:    DME Agency:     HH Arranged:    [place] Agency:     Status of Service:     Medicare Important Message Given:    Date Medicare IM Given:    Medicare IM give by:    Date Additional Medicare IM Given:    Additional Medicare Important Message give by:     If discussed at [place] of Stay Meetings, dates discussed:    Additional Comments: ur review done, left pt 30day free and copay assist card for brilinta. Left pt inform on  [facility] since no ins listed and also [place] [facility] [place] Left  Pt assist form for brilinta on shadow chart.   Will cont to follow.  Lacretia Leigh, RN 02/06/2015, 9:50 AM

## 2015-02-06 NOTE — ED Notes (Signed)
Pt states he had some chest tightness on Saturday that lasted for 15-30 mins and that it went away. This morning he was awakened from sleep with what he describes as a crushing chest pressure and "heaviness to both arms".

## 2015-02-06 NOTE — H&P (Signed)
History and Physical Note:  NAME:  Randy Hebert   MRN: [id number] DOB:  13-Mar-1966   ADMIT DATE: 02/06/2015   02/06/2015 6:13 AM  Randy Hebert is a 49 y.o. obese male with the only cardiac risk factor of having a father with a stroke and MI in his 16s but otherwise no hypertension or diabetes/dyslipidemia. Have reported allergy/asthma. He smokes cigars but not cigarettes routinely. He has been smoking for years. Does not know exactly the time he started.  3 days ago he had an episode of substernal chest pressure or heaviness associated with diaphoresis and dyspnea that did not improve with inhaler. Since then he is on relatively well without any major complaints until this morning at roughly 2:30 morning when he got up to go to the bathroom. After finishing going to the bathroom he again developed substernal chest heaviness and pressure but this time without significant dyspnea or diaphoresis. Denied improve inhaler and therefore he called EMS. He presented to [facility] emergency room with 6/10 chest pressure/pain, and worse pain that he had was 9-10/10 at onset.  Visual EKG was not very really revealing, however follow-up EKG showed clear S2 elevations in lead 1 and aVL with depressions in 23 and aVF consistent with lateral MI. Code STEMI was called at 0456 hours.  He was given IV heparin bolus with infusion. Initially nitroglycerin paste was started and was removed. He was loaded on stretcher by EMS at roughly 5:30 this morning with 4/10 pain. He was administered IV Lopressor 5 mg 1. He was also given morphine 4 mg 1.  He arrived at [facility] cardiac catheterization lab at [phone] hours.  2-3/10  Cardiovascular ROS: positive for - chest pain, irregular heartbeat and shortness of breath negative for - edema, loss of consciousness, orthopnea, palpitations, paroxysmal nocturnal dyspnea, rapid heart rate, shortness of breath or TIA/Amaurosis [place], Syncope/near syncope  Past Medical  History  Diagnosis Date  . Neck pain   . Otitis media   . Pleurisy    Past Surgical History  Procedure Laterality Date  . Incision / drainage hand / finger      FAMHx: Family History  Problem Relation Age of Onset  . Heart attack Father   . Stroke Father     SOCHx:  reports that he has been smoking.  He does not have any smokeless tobacco history on file. He reports that he drinks alcohol. He reports that he does not use illicit drugs.  ALLERGIES: Allergies  Allergen Reactions  . Amoxicillin Nausea And Vomiting    HOME MEDICATIONS: Mostly takes OTC medications No current facility-administered medications on file prior to encounter.   Current Outpatient Prescriptions on File Prior to Encounter  Medication Sig Dispense Refill  . Aspirin-Acetaminophen (GOODYS BODY PAIN PO) Take 1 packet by mouth daily as needed (headache).    . cyclobenzaprine (FLEXERIL) 10 MG tablet Take 1 tablet (10 mg total) by mouth 2 (two) times daily as needed for muscle spasms. 20 tablet 0  . cyclobenzaprine (FLEXERIL) 10 MG tablet Take 1 tablet (10 mg total) by mouth 2 (two) times daily as needed for muscle spasms. 20 tablet 0  . naproxen (NAPROSYN) 500 MG tablet Take 1 tablet (500 mg total) by mouth 2 (two) times daily. 30 tablet 0  . oxyCODONE-acetaminophen (PERCOCET) 5-325 MG per tablet Take 2 tablets by mouth every 4 (four) hours as needed for pain. 20 tablet 0   Review of Systems  Constitutional: Negative for malaise/fatigue.  HENT: Negative for nosebleeds.   Eyes: Negative for blurred vision and double vision.  Respiratory: Positive for shortness of breath. Negative for cough, sputum production and wheezing.   Cardiovascular: Positive for chest pain. Negative for palpitations, claudication, leg swelling and PND.  Gastrointestinal: Negative for blood in stool and melena.  Genitourinary: Negative for hematuria.  Musculoskeletal: Negative.   Neurological: Negative for dizziness, sensory change,  speech change and headaches.  Endo/Heme/Allergies: Does not bruise/bleed easily.  Psychiatric/Behavioral: Negative.   All other systems reviewed and are negative.   PHYSICAL EXAM:Blood pressure 140/74, pulse 79, temperature 97.8 F (36.6 C), temperature source Oral, resp. rate 19, height '5\' 5"'$  (1.651 m), weight 90.719 kg (200 lb), SpO2 98 %. General appearance: alert, cooperative, appears stated age, moderate distress and Otherwise healthy appearing Neck: no adenopathy, no carotid bruit, no JVD, supple, symmetrical, trachea midline and thyroid not enlarged, symmetric, no tenderness/mass/nodules Lungs: clear to auscultation bilaterally and normal percussion bilaterally Heart: regular rate and rhythm, S1, S2 normal, no murmur, click, rub or gallop and normal apical impulse Abdomen: soft, non-tender; bowel sounds normal; no masses,  no organomegaly Extremities: extremities normal, atraumatic, no cyanosis or edema Pulses: 2+ and symmetric Neurologic: Mental status: Alert, oriented, thought content appropriate Cranial nerves: normal  HEENT: [place]/AT, EOMI, MMM, anicteric sclera   Adult ECG Report  Rate: 66 ;  Rhythm: normal sinus rhythm  QRS Axis: 95 (R ward) ;  PR Interval: 176 ;  QRS Duration: 93; QTc: 422;  Voltages: normal  Conduction Disturbances: none  Other Abnormalities: ~1.5-2 mm STE in I & aVL with significant ST Depression in II, III, aVF c/w Lat STEMI   Narrative Interpretation: Acute lateral ST elevation/injury pattern  Lab Results  Component Value Date   CREATININE 0.95 02/06/2015   Lab Results  Component Value Date   K 3.9 02/06/2015  Na 140, Cl 3.9, Glu 108, BUN 20.  Mag 1.8.  LFTs - normal   Lab Results  Component Value Date   WBC 9.6 02/06/2015   HGB 15.5 02/06/2015   HCT 46.8 02/06/2015   MCV 88.3 02/06/2015   PLT 295 02/06/2015    IMPRESSION & PLAN The patients' history has been reviewed, patient examined, no change in status from most recent note, stable  for surgery. I have reviewed the patients' chart and labs. Questions were answered to the patient's satisfaction.    Randy Hebert has presented today for surgery, with the diagnosis of lateral STEMI. The various methods of treatment have been discussed with the patient and family.   Risks / Complications include, but not limited to: Death, MI, CVA/TIA, VF/VT (with defibrillation), Bradycardia (need for temporary pacer placement), contrast induced nephropathy, bleeding / bruising / hematoma / pseudoaneurysm, vascular or coronary injury (with possible emergent CT or Vascular Surgery), adverse medication reactions, infection.     After consideration of risks, benefits and other options for treatment, the patient has consented to Procedure(s):  LEFT HEART CATHETERIZATION AND CORONARY ANGIOGRAPHY +/- AD Crawford  Emergency consent implied  as a surgical intervention.   We will proceed with the planned procedure.   Giannie Soliday, [facility] [place]. [place],   [address]  [phone]  02/06/2015 6:13 AM

## 2015-02-06 NOTE — Progress Notes (Signed)
  Echocardiogram 2D Echocardiogram has been performed.  Randy Hebert 02/06/2015, 5:02 PM

## 2015-02-06 NOTE — Progress Notes (Signed)
ANTICOAGULATION CONSULT NOTE - Initial Consult  Pharmacy Consult for heparin Indication: chest pain/ACS  Allergies  Allergen Reactions  . Amoxicillin Nausea And Vomiting    Patient Measurements: Height: '5\' 5"'$  (165.1 cm) Weight: 200 lb (90.719 kg) IBW/kg (Calculated) : 61.5 Heparin Dosing Weight: 77.2 kg  Vital Signs: Temp: 97.8 F (36.6 C) (06/07 0444) Temp Source: Oral (06/07 0444) BP: 152/110 mmHg (06/07 0500) Pulse Rate: 80 (06/07 0500)  Labs:  Recent Labs  02/06/15 0445  HGB 15.5  HCT 46.8  PLT 295  APTT 29  LABPROT 15.4*  INR 1.20  TROPONINI <0.03    CrCl cannot be calculated (Patient has no serum creatinine result on file.).   Medical History: Past Medical History  Diagnosis Date  . Neck pain   . Otitis media   . Pleurisy     Medications:  Infusions:  . sodium chloride 10 mL (02/06/15 0505)  . heparin 1,100 Units/hr (02/06/15 0517)    Assessment: 49 yo male presents with chest pain and tightness. EKG done with ST changes, STEMI called.  Goal of Therapy:  Heparin level 0.3-0.7 units/ml Monitor platelets by anticoagulation protocol: Yes   Plan:  Heparin bolus given in ED.  Start heparin infusion at 1100 units/hr, 14 units/kg/hour. Check 6 hour heparin level  Randy Hebert 02/06/2015,5:15 AM

## 2015-02-06 NOTE — ED Provider Notes (Signed)
CSN: [id number]     Arrival date & time 02/06/15  0426 History   First MD Initiated Contact with Patient 02/06/15 [phone]     Chief Complaint  Patient presents with  . Chest Pain     (Consider location/radiation/quality/duration/timing/severity/associated sxs/prior Treatment) HPI  Patient reports 3 afternoons ago he had central chest pain and states it felt like heaviness like someone sitting on his chest. He was diaphoretic and had shortness of breath. He used an asthma inhaler without relief. He states that discomfort lasted 20-30 minutes. He states 2:30 this morning he got up to use the bathroom and when he was done he started getting chest tightness and then a feeling of heaviness like somebody sitting on his chest again. The pain does not radiate. He did not have diaphoresis or shortness of breath tonight. He denies any nausea or vomiting. He again tried asthma inhaler without improvement. He states his current pain as a 6-7 out of 10 and at its worse was a 9-10 out of 10 which was shortly after the pain started.  Patient does smoke. He does not have a history of hypertension or diabetes. He states his father died at age 43 from an MI followed a few hours later by stroke.   PCP none  Past Medical History  Diagnosis Date  . Neck pain   . Otitis media   . Pleurisy    Past Surgical History  Procedure Laterality Date  . Incision / drainage hand / finger     History reviewed. No pertinent family history. History  Substance Use Topics  . Smoking status: Current Every Day Smoker -- 1.00 packs/day  . Smokeless tobacco: Not on file  . Alcohol Use: Yes  smokes 6 cigars a day Drinks a 6 ppd employed   Review of Systems  All other systems reviewed and are negative.     Allergies  Amoxicillin  Home Medications   Prior to Admission medications   Medication Sig Start Date End Date Taking? Authorizing Provider  amoxicillin-clavulanate (AUGMENTIN) 875-125 MG per tablet Take 1  tablet by mouth 2 (two) times daily. 06/20/13   Nat Christen, MD  amoxicillin-clavulanate (AUGMENTIN) 875-125 MG per tablet Take 1 tablet by mouth 2 (two) times daily. One po bid x 7 days 06/20/13   Nat Christen, MD  Aspirin-Acetaminophen (GOODYS BODY PAIN PO) Take 1 packet by mouth daily as needed (headache).    Historical Provider, MD  cyclobenzaprine (FLEXERIL) 10 MG tablet Take 1 tablet (10 mg total) by mouth 2 (two) times daily as needed for muscle spasms. 06/20/13   Nat Christen, MD  cyclobenzaprine (FLEXERIL) 10 MG tablet Take 1 tablet (10 mg total) by mouth 2 (two) times daily as needed for muscle spasms. 06/20/13   Nat Christen, MD  naproxen (NAPROSYN) 500 MG tablet Take 1 tablet (500 mg total) by mouth 2 (two) times daily. 06/20/13   Nat Christen, MD  oxyCODONE-acetaminophen (PERCOCET) 5-325 MG per tablet Take 2 tablets by mouth every 4 (four) hours as needed for pain. 06/20/13   Nat Christen, MD   BP 155/97 mmHg  Pulse 74  Temp(Src) 97.8 F (36.6 C) (Oral)  Resp 24  Ht '5\' 5"'$  (1.651 m)  Wt 200 lb (90.719 kg)  BMI 33.28 kg/m2  SpO2 95%  Vital signs normal   Physical Exam  Constitutional: He is oriented to person, place, and time. He appears well-developed and well-nourished.  Non-toxic appearance. He does not appear ill. No distress.  HENT:  Head:  Normocephalic and atraumatic.  Right Ear: External ear normal.  Left Ear: External ear normal.  Nose: Nose normal. No mucosal edema or rhinorrhea.  Mouth/Throat: Oropharynx is clear and moist and mucous membranes are normal. No dental abscesses or uvula swelling.  Eyes: Conjunctivae and EOM are normal. Pupils are equal, round, and reactive to light.  Neck: Normal range of motion and full passive range of motion without pain. Neck supple.  Cardiovascular: Normal rate, regular rhythm and normal heart sounds.  Exam reveals no gallop and no friction rub.   No murmur heard. Pulmonary/Chest: Effort normal and breath sounds normal. No respiratory  distress. He has no wheezes. He has no rhonchi. He has no rales. He exhibits no tenderness and no crepitus.  Abdominal: Soft. Normal appearance and bowel sounds are normal. He exhibits no distension. There is no tenderness. There is no rebound and no guarding.  Musculoskeletal: Normal range of motion. He exhibits no edema or tenderness.  Moves all extremities well.   Neurological: He is alert and oriented to person, place, and time. He has normal strength. No cranial nerve deficit.  Skin: Skin is warm, dry and intact. No rash noted. No erythema. No pallor.  Psychiatric: His speech is normal and behavior is normal. His mood appears anxious.  Nursing note and vitals reviewed.   ED Course  Procedures (including critical care time)  Medications  0.9 %  sodium chloride infusion (10 mLs Intravenous New Bag/Given 02/06/15 0505)  heparin ADULT infusion 100 units/mL (25000 units/250 mL) (1,100 Units/hr Intravenous New Bag/Given 02/06/15 0517)  aspirin chewable tablet 324 mg (324 mg Oral Given 02/06/15 0453)  heparin injection 4,000 Units (4,000 Units Intravenous Given 02/06/15 0507)  nitroGLYCERIN 50 mg in dextrose 5 % 250 mL (0.2 mg/mL) infusion (5 mcg/min Intravenous New Bag/Given 02/06/15 0503)  morphine 4 MG/ML injection 4 mg (4 mg Intravenous Given 02/06/15 0517)  metoprolol (LOPRESSOR) injection 5 mg (5 mg Intravenous Given 02/06/15 0532)   Patient was started on nitroglycerin paste and given aspirin to chew. He was given morphine for pain. He was given a bolus of heparin.  Repeat EKG done and the ST changes are now more clear. STEMI was called. Patient was started on nitroglycerin drip. Nitroglycerin paste was removed.  04:58 Dr Ellyn Hack, Cardiology accepts in transfer to the cath lab.  05:30 EMS loading patient on their stretcher. Pt states his pain is an 4/10. Pt was given lopressor IV (heart rate 70).    Labs Review Results for orders placed or performed during the hospital encounter of 02/06/15   CBC with Differential  Result Value Ref Range   WBC 9.6 4.0 - 10.5 K/uL   RBC 5.30 4.22 - 5.81 MIL/uL   Hemoglobin 15.5 13.0 - 17.0 g/dL   HCT 46.8 39.0 - 52.0 %   MCV 88.3 78.0 - 100.0 fL   MCH 29.2 26.0 - 34.0 pg   MCHC 33.1 30.0 - 36.0 g/dL   RDW 13.9 11.5 - 15.5 %   Platelets 295 150 - 400 K/uL   Neutrophils Relative % 56 43 - 77 %   Neutro Abs 5.5 1.7 - 7.7 K/uL   Lymphocytes Relative 27 12 - 46 %   Lymphs Abs 2.6 0.7 - 4.0 K/uL   Monocytes Relative 12 3 - 12 %   Monocytes Absolute 1.1 (H) 0.1 - 1.0 K/uL   Eosinophils Relative 4 0 - 5 %   Eosinophils Absolute 0.4 0.0 - 0.7 K/uL   Basophils Relative 1 0 -  1 %   Basophils Absolute 0.1 0.0 - 0.1 K/uL  Comprehensive metabolic panel  Result Value Ref Range   Sodium 140 135 - 145 mmol/L   Potassium 3.9 3.5 - 5.1 mmol/L   Chloride 105 101 - 111 mmol/L   CO2 24 22 - 32 mmol/L   Glucose, Bld 108 (H) 65 - 99 mg/dL   BUN 20 6 - 20 mg/dL   Creatinine, Ser 0.95 0.61 - 1.24 mg/dL   Calcium 8.7 (L) 8.9 - 10.3 mg/dL   Total Protein 6.9 6.5 - 8.1 g/dL   Albumin 3.8 3.5 - 5.0 g/dL   AST 19 15 - 41 U/L   ALT 22 17 - 63 U/L   Alkaline Phosphatase 93 38 - 126 U/L   Total Bilirubin 0.3 0.3 - 1.2 mg/dL   GFR calc non Af Amer >60 >60 mL/min   GFR calc Af Amer >60 >60 mL/min   Anion gap 11 5 - 15  Troponin I  Result Value Ref Range   Troponin I <0.03 <0.031 ng/mL  APTT  Result Value Ref Range   aPTT 29 24 - 37 seconds  Protime-INR  Result Value Ref Range   Prothrombin Time 15.4 (H) 11.6 - 15.2 seconds   INR 1.20 0.00 - 1.49  Magnesium  Result Value Ref Range   Magnesium 1.8 1.7 - 2.4 mg/dL   Laboratory interpretation all normal     Imaging Review Dg Chest Port 1 View  02/06/2015   CLINICAL DATA:  Awoke from sleep with crushing chest pressure.  EXAM: PORTABLE CHEST - 1 VIEW  COMPARISON:  None.  FINDINGS: The heart is at the upper limits of normal in size. Question right aortic arch. Mild hyperinflation and peribronchial  cuffing. No confluent airspace disease. No pleural effusion or pneumothorax. No acute osseous abnormality.  IMPRESSION: 1. Borderline cardiomegaly.  Question of right aortic arch. 2. Mild hyperinflation and peribronchial cuffing. Can be seen in the setting of bronchitis, acute or chronic.   Electronically Signed   By: Jeb Levering M.D.   On: 02/06/2015 05:12     EKG Interpretation   Date/Time:  Tuesday February 06 2015 04:32:46 EDT Ventricular Rate:  73 PR Interval:  173 QRS Duration: 97 QT Interval:  394 QTC Calculation: 434 R Axis:   93 Text Interpretation:  Sinus rhythm Borderline right axis deviation Since  last tracing 24 Aug 2011 ST depr, consider ischemia, inferior leads ST  elevation, consider lateral injury Baseline wander in lead(s) V2 V6  Confirmed by Leonor Darnell  MD-I, Limuel Nieblas ([id number]) on 02/06/2015 4:40:11 AM   #2  EKG Interpretation  Date/Time:  Tuesday February 06 2015 04:51:03 EDT Ventricular Rate:  66 PR Interval:  176 QRS Duration: 93 QT Interval:  403 QTC Calculation: 422 R Axis:   95 Text Interpretation:  Sinus rhythm Lateral infarct, acute (LAD) Confirmed by Athalia Setterlund  MD-I, Shandee Jergens ([id number]) on 02/06/2015 5:11:20 AM          MDM   Final diagnoses:  ST elevation myocardial infarction (STEMI), unspecified artery    Plan transfer [facility] to [facility] lab   Rolland Porter, MD, [place] Performed by: Rolland Porter L Total critical care time: 36 min Critical care time was exclusive of separately billable procedures and treating other patients. Critical care was necessary to treat or prevent imminent or life-threatening deterioration. Critical care was time spent personally by me on the following activities: development of treatment plan with patient and/or surrogate as well as  nursing, discussions with consultants, evaluation of patient's response to treatment, examination of patient, obtaining history from patient or surrogate, ordering and performing treatments and  interventions, ordering and review of laboratory studies, ordering and review of radiographic studies, pulse oximetry and re-evaluation of patient's condition.    Rolland Porter, MD 02/06/15 [phone]

## 2015-02-06 NOTE — Progress Notes (Signed)
Utilization Review completed. Utilization review completed.  Rayburn Ma RN, BSN, CM

## 2015-02-06 NOTE — ED Notes (Signed)
Pharmacist on call paged for heparin.

## 2015-02-07 LAB — TROPONIN I
TROPONIN I: 9.38 ng/mL — AB (ref <0.031)
Troponin I: 21.52 ng/mL (ref <0.031)
Troponin I: 9.75 ng/mL (ref <0.031)

## 2015-02-07 LAB — BASIC METABOLIC PANEL
Anion gap: 8 (ref 5–15)
BUN: 11 mg/dL (ref 6–20)
CALCIUM: 8.9 mg/dL (ref 8.9–10.3)
CO2: 25 mmol/L (ref 22–32)
CREATININE: 0.89 mg/dL (ref 0.61–1.24)
Chloride: 101 mmol/L (ref 101–111)
GFR calc non Af Amer: >60 mL/min (ref >60)
Glucose, Bld: 109 mg/dL — ABNORMAL HIGH (ref 65–99)
Potassium: 4.1 mmol/L (ref 3.5–5.1)
Sodium: 134 mmol/L — ABNORMAL LOW (ref 135–145)

## 2015-02-07 LAB — CBC
HCT: 46.7 % (ref 39.0–52.0)
HEMOGLOBIN: 15.5 g/dL (ref 13.0–17.0)
MCH: 28.9 pg (ref 26.0–34.0)
MCHC: 33.2 g/dL (ref 30.0–36.0)
MCV: 87.1 fL (ref 78.0–100.0)
Platelets: 281 10*3/uL (ref 150–400)
RBC: 5.36 MIL/uL (ref 4.22–5.81)
RDW: 13.9 % (ref 11.5–15.5)
WBC: 11.6 10*3/uL — ABNORMAL HIGH (ref 4.0–10.5)

## 2015-02-07 MED ORDER — LISINOPRIL 5 MG PO TABS
5.0000 mg | ORAL_TABLET | Freq: Every day | ORAL | Status: DC
  Administered 2015-02-07 – 2015-02-09 (×3): 5 mg via ORAL
  Filled 2015-02-07 (×4): qty 1

## 2015-02-07 NOTE — Progress Notes (Signed)
CARDIAC REHAB PHASE I   PRE:  Rate/Rhythm: 77 SR  BP:  Sitting: 127/83        SaO2: 93RA  MODE:  Ambulation: 700 ft   POST:  Rate/Rhythm: 105 ST  BP:  Sitting: 135/88         SaO2: 95 RA  Pt ambulated 700 ft on RA, independent, steady gait, tolerated well.  Pt c/o of mild DOE and hip pain, denies cp, dizziness, declined rest stop. Pt does state he has felt "breathless" at times, and that it "comes on quickly and then goes away almost like a wave of anxiety." Pt may be experiencing side effects of brilinta. Completed MI/stent education.  Reviewed anti-platelet therapy, stent card, activity restrictions,risk factors, tobacco/ETOH cessation (pt states he drinks 6, 16 oz beers/day), ntg, exercise, heart healthy diet, sodium and fluid restrictions, daily weights, phase 2 cardiac rehab. Pt verbalized understanding, may need reinforcement/teach again back prior to discharge. Gave pt CHF book and reviewed. Pt states he has noticed swelling in his legs recently. Pt agrees to phase 2 cardiac rehab. Will send referral to [facility]. Pt states he does not have insurance and therefore has not gone to the doctor regularly. This may be an issue with compliance, pt states he will try to "work things out."  Pt to bed per request after walk, call bell within reach. Will follow-up tomorrow.   [phone]  Lenna Sciara, RN, BSN 02/07/2015 12:01 PM

## 2015-02-07 NOTE — Progress Notes (Signed)
Patient ID: Randy Hebert, male   DOB: 1965-11-16, 49 y.o.   MRN: [id number]    Subjective:  Denies SSCP, palpitations or Dyspnea   Objective:  Filed Vitals:   02/07/15 0500 02/07/15 0600 02/07/15 0700 02/07/15 0709  BP: 117/88 133/71 123/95   Pulse: 65 63 65   Temp:    97.7 F (36.5 C)  TempSrc:    Oral  Resp:   17   Height:      Weight:      SpO2: 96% 94% 94%     Intake/Output from previous day:  Intake/Output Summary (Last 24 hours) at 02/07/15 0758 Last data filed at 02/07/15 0400  Gross per 24 hour  Intake 533.27 ml  Output   3150 ml  Net -2616.73 ml    Physical Exam: Affect appropriate Healthy:  appears stated age HEENT: normal Neck supple with no adenopathy JVP normal no bruits no thyromegaly Lungs clear with no wheezing and good diaphragmatic motion Heart:  S1/S2 no murmur, no rub, gallop or click PMI normal Abdomen: benighn, BS positve, no tenderness, no AAA no bruit.  No HSM or HJR Distal pulses intact with no bruits No edema Neuro non-focal Skin warm and dry No muscular weakness Right radial A   Lab Results: Basic Metabolic Panel:  Recent Labs  02/06/15 0445 02/07/15 0130  NA 140 134*  K 3.9 4.1  CL 105 101  CO2 24 25  GLUCOSE 108* 109*  BUN 20 11  CREATININE 0.95 0.89  CALCIUM 8.7* 8.9  MG 1.8  --    Liver Function Tests:  Recent Labs  02/06/15 0445  AST 19  ALT 22  ALKPHOS 93  BILITOT 0.3  PROT 6.9  ALBUMIN 3.8   CBC:  Recent Labs  02/06/15 0445 02/07/15 0130  WBC 9.6 11.6*  NEUTROABS 5.5  --   HGB 15.5 15.5  HCT 46.8 46.7  MCV 88.3 87.1  PLT 295 281   Cardiac Enzymes:  Recent Labs  02/06/15 1340 02/06/15 1934 02/07/15 0130  TROPONINI 26.61* 23.91* 21.52*    Imaging: Dg Chest Port 1 View  02/06/2015   CLINICAL DATA:  Awoke from sleep with crushing chest pressure.  EXAM: PORTABLE CHEST - 1 VIEW  COMPARISON:  None.  FINDINGS: The heart is at the upper limits of normal in size. Question right aortic  arch. Mild hyperinflation and peribronchial cuffing. No confluent airspace disease. No pleural effusion or pneumothorax. No acute osseous abnormality.  IMPRESSION: 1. Borderline cardiomegaly.  Question of right aortic arch. 2. Mild hyperinflation and peribronchial cuffing. Can be seen in the setting of bronchitis, acute or chronic.   Electronically Signed   By: Jeb Levering M.D.   On: 02/06/2015 05:12    Cardiac Studies:  ECG:  SR lateral infarct biphasic T waves in I, AVL   Telemetry:  NSR no VT 02/07/2015   Echo:  EF 40% reviewed   Medications:   . aspirin  81 mg Oral Daily  . atorvastatin  80 mg Oral q1800  . carvedilol  3.125 mg Oral BID WC  . pneumococcal 23 valent vaccine  0.5 mL Intramuscular Tomorrow-1000  . sodium chloride  3 mL Intravenous Q12H  . ticagrelor  90 mg Oral BID     . sodium chloride Stopped (02/06/15 8657)    Assessment/Plan:  MI:  Diagonal branch occlusion post stent.  Continue ticagrelor and ASA.  Transfer to floor  From [place] And would f/u in that clinic on d/c  Transfer  to floor DC in am if stable  HTN:  With EF 40% add ACE  Chol:  On statin   Jenkins Rouge 02/07/2015, 7:58 AM

## 2015-02-07 NOTE — Progress Notes (Signed)
02/07/2015 1430 Received pt. To room 2w20 from [facility].  Pt is A&O, no c/o voiced.  Tele monitor applied and CCMD notified.  Oriented to room, call light and bed.  Call bell in reach and family at bedside. [place]

## 2015-02-07 NOTE — Progress Notes (Signed)
Attempted to call report at this time; unable to do so at this time; will try back again shortly.

## 2015-02-07 NOTE — Progress Notes (Signed)
   02/07/15 1000  Clinical Encounter Type  Visited With Patient  Visit Type Initial;Critical Care  Advance Directives (For Healthcare)  Does patient have an advance directive? No  Would patient like information on creating an advanced directive? Yes - Educational materials given   Chaplain was referred to patient via spiritual care consult. Consult indicated patient would like to create or updated an advanced directive. Chaplain visited with patient for roughly 15 minutes this morning. Patient was still interested in completing an advanced directive. Chaplain provided patient with a blank advanced directive form and went over the contents. Please call spiritual care department if patient completes advanced directive and needs document signed and notarized while in hospital.  Gar Ponto, Chaplain  10:14 AM

## 2015-02-08 LAB — BASIC METABOLIC PANEL
Anion gap: 9 (ref 5–15)
BUN: 16 mg/dL (ref 6–20)
CHLORIDE: 102 mmol/L (ref 101–111)
CO2: 24 mmol/L (ref 22–32)
Calcium: 9 mg/dL (ref 8.9–10.3)
Creatinine, Ser: 1.18 mg/dL (ref 0.61–1.24)
GFR calc non Af Amer: >60 mL/min (ref >60)
GLUCOSE: 134 mg/dL — AB (ref 65–99)
POTASSIUM: 4.2 mmol/L (ref 3.5–5.1)
SODIUM: 135 mmol/L (ref 135–145)

## 2015-02-08 LAB — TROPONIN I
TROPONIN I: 10.34 ng/mL — AB (ref <0.031)
TROPONIN I: 8.77 ng/mL — AB (ref <0.031)
TROPONIN I: 6.98 ng/mL — AB (ref <0.031)
TROPONIN I: 6.29 ng/mL — AB (ref <0.031)
Troponin I: 6.36 ng/mL (ref <0.031)

## 2015-02-08 NOTE — Progress Notes (Signed)
.  CARDIAC REHAB PHASE I   PRE:  Rate/Rhythm: 88 SR  BP:  Sitting: 106/73        SaO2: 98 RA  MODE:  Ambulation: 690 ft   POST:  Rate/Rhythm: 94 SR  BP:  Sitting: 112/73         SaO2: 98 RA  Pt began ambulating in hallway independently this morning, agreeable for cardiac rehab to join.  Pt states he ambulated independently last night.  Pt ambulated 690 ft on RA, independent, steady gait, tolerated fair. Pt does c/o feeling "exhausted" after ambulation and mild DOE, pt also c/o L hip pain.  Denies CP, dizziness. Pt provided teach back for education, verbalized importance of anti-platelet therapy, daily weights, when to call the doctor, sodium and fluid restrictions, tobacco cessation and limiting ETOH, and use of nitroglycerin.  Pt to side of bed after walk per request, call bell within reach.   [phone]  Lenna Sciara, RN, BSN 02/08/2015 9:26 AM

## 2015-02-08 NOTE — Progress Notes (Signed)
Patient ID: Randy Hebert, male   DOB: March 25, 1966, 49 y.o.   MRN: [id number]    Subjective:  Denies SSCP, palpitations or Dyspnea 9 second pause on telemetry but not symptomatic ?   Objective:  Filed Vitals:   02/07/15 1735 02/07/15 2005 02/08/15 0411 02/08/15 0825  BP: 127/77 122/89 108/81 113/60  Pulse: 82 78 80 92  Temp:  98 F (36.7 C) 98.3 F (36.8 C)   TempSrc:  Oral Oral   Resp:  18 18   Height:      Weight:      SpO2:  93% 97%     Intake/Output from previous day:  Intake/Output Summary (Last 24 hours) at 02/08/15 6811 Last data filed at 02/07/15 2339  Gross per 24 hour  Intake    250 ml  Output      0 ml  Net    250 ml    Physical Exam: Affect appropriate Healthy:  appears stated age HEENT: normal Neck supple with no adenopathy JVP normal no bruits no thyromegaly Lungs clear with no wheezing and good diaphragmatic motion Heart:  S1/S2 no murmur, no rub, gallop or click PMI normal Abdomen: benighn, BS positve, no tenderness, no AAA no bruit.  No HSM or HJR Distal pulses intact with no bruits No edema Neuro non-focal Skin warm and dry No muscular weakness Right radial A   Lab Results: Basic Metabolic Panel:  Recent Labs  02/06/15 0445 02/07/15 0130  NA 140 134*  K 3.9 4.1  CL 105 101  CO2 24 25  GLUCOSE 108* 109*  BUN 20 11  CREATININE 0.95 0.89  CALCIUM 8.7* 8.9  MG 1.8  --    Liver Function Tests:  Recent Labs  02/06/15 0445  AST 19  ALT 22  ALKPHOS 93  BILITOT 0.3  PROT 6.9  ALBUMIN 3.8   CBC:  Recent Labs  02/06/15 0445 02/07/15 0130  WBC 9.6 11.6*  NEUTROABS 5.5  --   HGB 15.5 15.5  HCT 46.8 46.7  MCV 88.3 87.1  PLT 295 281   Cardiac Enzymes:  Recent Labs  02/07/15 2245 02/08/15 0133 02/08/15 0746  TROPONINI 10.34* 8.77* 6.98*    Imaging: No results found.  Cardiac Studies:  ECG:  SR lateral infarct biphasic T waves in I, AVL   Telemetry:  3 episodes high grade AV block longest pause 9 seconds   Currently NSR rate 82  Echo:  EF 40% reviewed   Medications:   . aspirin  81 mg Oral Daily  . atorvastatin  80 mg Oral q1800  . carvedilol  3.125 mg Oral BID WC  . lisinopril  5 mg Oral Daily  . sodium chloride  3 mL Intravenous Q12H  . ticagrelor  90 mg Oral BID     . sodium chloride Stopped (02/06/15 5726)    Assessment/Plan:  MI:  Diagonal branch occlusion post stent.  Continue ticagrelor and ASA.  Transfer to floor  From [place] And would f/u in that clinic on d/c     HTN:  With EF 40% on  ACE  Chol:  On statin   Bradycardia:  Telemetry review shows 3 episodes of high grade heart block one with asytole for 9 seconds.  Will stop beta blocker And observe 48 hrs.  [place]ehck ECG and BMET  Surprisingly patient no symptoms   Jenkins Rouge 02/08/2015, 9:28 AM

## 2015-02-08 NOTE — Progress Notes (Signed)
02/08/2015 12:34 PM Nursing note In speaking with patient pt. Voicing concern about last pm hr dropping and states that at home he struggles often with snoring. Family member at bedside states that he often "holds his breath" while sleeping. Pt. Concerned about possible sleep apnea. Rosaria Ferries [facility] paged and made aware. Telephone orders received for continuous pulse oximetry at night while patient sleeping. Orders placed. Pt. Updated on plan of care. Will continue to closely monitor patient.  Rashi Giuliani, Arville Lime

## 2015-02-08 NOTE — Progress Notes (Signed)
02/08/2015 9:10 AM Nursing note Upon a.m review of telemetry strips, noted at 0250 am this morning, pt. Had 9.38 second sinus arrest. No nursing note documented in regards to event. Information in regards to event not relayed in report. A.m. Coreg already administered. Morning HR and ECG intervals all WNL along with VSS. Rosaria Ferries PA on floor and made aware along with Dr. Debara Pickett paged and made aware of findings. PA to see patient. Will await further orders. Will continue to closely monitor patient.  Bridgit Eynon, Arville Lime

## 2015-02-09 ENCOUNTER — Encounter (HOSPITAL_COMMUNITY): Payer: Self-pay | Admitting: Physician Assistant

## 2015-02-09 DIAGNOSIS — I10 Essential (primary) hypertension: Secondary | ICD-10-CM

## 2015-02-09 LAB — TROPONIN I
TROPONIN I: 6.94 ng/mL — AB (ref <0.031)
Troponin I: 3.98 ng/mL (ref <0.031)

## 2015-02-09 MED ORDER — ATORVASTATIN CALCIUM 80 MG PO TABS: 80.0000 mg | ORAL_TABLET | Freq: Every day | ORAL | Status: DC

## 2015-02-09 MED ORDER — NITROGLYCERIN 0.4 MG SL SUBL: 0.4000 mg | SUBLINGUAL_TABLET | SUBLINGUAL | Status: DC | PRN

## 2015-02-09 MED ORDER — TICAGRELOR 90 MG PO TABS: 90.0000 mg | ORAL_TABLET | Freq: Two times a day (BID) | ORAL | Status: DC

## 2015-02-09 MED ORDER — LISINOPRIL 5 MG PO TABS: 5.0000 mg | ORAL_TABLET | Freq: Every day | ORAL | Status: DC

## 2015-02-09 NOTE — Progress Notes (Signed)
Discharged to home with family office visits in place teaching done  

## 2015-02-09 NOTE — Progress Notes (Signed)
CARDIAC REHAB PHASE I   PRE:  Rate/Rhythm: 42 SR  BP:  Sitting: 139/72        SaO2: 97 RA  MODE:  Ambulation: 600 ft   POST:  Rate/Rhythm: 91 SR  BP:  Sitting: 130/80         SaO2: 99 RA  Pt states he is going home today. Pt ambulated 600 ft on RA, independent, brisk steady gait, tolerated well. Pt did c/o mild fatigue and shortness of breath after walk, denies CP, dizziness. Pt states he has no questions regarding education materials. Pt remains agreeable to phase 2 referral to [place]. Pt to recliner after walk, call bell within reach.  [phone]   Lenna Sciara, RN, BSN 02/09/2015 10:32 AM

## 2015-02-09 NOTE — Progress Notes (Signed)
Patient Profile: 49 year old white male with PMH of asthma/allergies and tobacco abuse presenting with chest pain and dyspnea.   Subjective: Feels like his normal self. Wants to go home. Says his breathing occasionally wakes him up at night due to forceful exhalation. No chest pain or dyspnea. No syncope, dizziness, or light headedness.   Objective: Vital signs in last 24 hours: Temp:  [97.6 F (36.4 C)-98.3 F (36.8 C)] 97.6 F (36.4 C) (06/10 0445) Pulse Rate:  [79-86] 79 (06/10 0445) Resp:  [18] 18 (06/10 0445) BP: (105-131)/(62-74) 120/71 mmHg (06/10 0445) SpO2:  [97 %] 97 % (06/10 0445) Last BM Date: 02/07/15  Intake/Output from previous day: 06/09 0701 - 06/10 0700 In: 843 [P.O.:840; I.V.:3] Out: 900 [Urine:900] Intake/Output this shift:    Medications Current Facility-Administered Medications  Medication Dose Route Frequency Provider Last Rate Last Dose  . 0.9 %  sodium chloride infusion   Intravenous Continuous Rolland Porter, MD   Stopped at 02/06/15 9371  . 0.9 %  sodium chloride infusion  250 mL Intravenous PRN Leonie Man, MD      . acetaminophen (TYLENOL) tablet 650 mg  650 mg Oral Q4H PRN Leonie Man, MD      . aspirin chewable tablet 81 mg  81 mg Oral Daily Leonie Man, MD   81 mg at 02/08/15 1005  . atorvastatin (LIPITOR) tablet 80 mg  80 mg Oral q1800 Leonie Man, MD   80 mg at 02/08/15 1702  . lisinopril (PRINIVIL,ZESTRIL) tablet 5 mg  5 mg Oral Daily Josue Hector, MD   5 mg at 02/08/15 1005  . morphine 2 MG/ML injection 2 mg  2 mg Intravenous Q1H PRN Leonie Man, MD      . ondansetron [facility]) injection 4 mg  4 mg Intravenous Q6H PRN Leonie Man, MD      . oxyCODONE-acetaminophen (PERCOCET/ROXICET) 5-325 MG per tablet 2 tablet  2 tablet Oral Q4H PRN Leonie Man, MD   2 tablet at 02/09/15 [id number]  . sodium chloride 0.9 % injection 3 mL  3 mL Intravenous Q12H Leonie Man, MD   3 mL at 02/08/15 2144  . sodium chloride 0.9 %  injection 3 mL  3 mL Intravenous PRN Leonie Man, MD      . ticagrelor [facility]) tablet 90 mg  90 mg Oral BID Leonie Man, MD   90 mg at 02/08/15 2144    PE: General appearance: alert, cooperative and no distress Neck: no adenopathy, no carotid bruit, no JVD, supple, symmetrical, trachea midline and thyroid not enlarged, symmetric, no tenderness/mass/nodules Lungs: clear to auscultation bilaterally Heart: regular rate and rhythm, S1, S2 normal, no murmur, click, rub or gallop Abdomen: soft, non-tender; bowel sounds normal; no masses,  no organomegaly Extremities: extremities normal, atraumatic, no cyanosis or edema Pulses: 2+ and symmetric Skin: Skin color, texture, turgor normal. No rashes or lesions Neurologic: Grossly normal  Lab Results:   Recent Labs  02/07/15 0130  WBC 11.6*  HGB 15.5  HCT 46.7  PLT 281   BMET  Recent Labs  02/07/15 0130 02/08/15 1424  NA 134* 135  K 4.1 4.2  CL 101 102  CO2 25 24  GLUCOSE 109* 134*  BUN 11 16  CREATININE 0.89 1.18  CALCIUM 8.9 9.0   Telemetry: SR-ST overnight. Rate currently 73. Two isolated 2.5 second pauses overnight; asymptomatic.   Assessment/Plan Principal Problem:   ST elevation myocardial infarction (STEMI)  of lateral wall, initial episode of care Active Problems:   ST elevation myocardial infarction (STEMI) of lateral wall   Sinus pause secondary to beta blocker   1. STEMI s/p PCI with DES to the first diagonal branch: continue ASA, atorvastatin, ACEI, and Brilinta. No BB due to sinus pause.   - stop drawing troponin, order cancelled  2. Sinus pause secondary to BB: on 02/08/15 he had several asymptomatic long pauses on telemetry, the longest was ~9 seconds. BB was discontinued. Last night he had one brief sinus pause that lasted 2.5 seconds which was also asymptomatic.  -No BB, overall HR improving. If no significant pause, expect discharge early tomorrow.   3. HTN: well controlled on ACEI   4. Likely  OSA: will need outpatient sleep study  Dispo: Telemetry monitoring for 24 hours then DC home. Will likely need sleep study as outpatient.    LOS: 3 days   Almyra Deforest, PA-C 02/09/2015 10:11 AM  No angina exam benign  No long symptomatic pauses off beta blocker. Ok to d/c Home today outpatient f/u Dr Robyne Askew

## 2015-02-09 NOTE — Care Management Note (Addendum)
Case Management Note  Patient Details  Name: Randy Hebert MRN: [id number] Date of Birth: October 18, 1965  Subjective/Objective:       Adm w at [facility]             Action/Plan: lives at home.  Pt will discharge on Brilinta.  Pt does CM will provide medication assistance and PCP referral prior to discharge   Expected Discharge Date:                  Expected Discharge Plan:  Home/Self Care  In-House Referral:     Discharge planning Services  CM Consult, Medication Assistance, Indigent Clinic  Post Acute Care Choice:    Choice offered to:     DME Arranged:    DME Agency:     HH Arranged:    HH Agency:     Status of Service:   Complete, will sign off  Medicare Important Message Given:   No Date Medicare IM Given:    Medicare IM give by:    Date Additional Medicare IM Given:    Additional Medicare Important Message give by:      Disposition Plan:  Home with self care  If discussed at Long Length of Stay Meetings, dates discussed:    02/09/15 Elenor Quinones, RN, BSN [phone] Pt does not have health insurance coverage. CM asked pt if she could set up appointment at local free clinic, pt request the clinic on [name] in [place].  CM contacted [name] on 02/08/15 and left voice mail requesting PCP appointment and Brilinta medication assistance, intake resource [name].  Resource contacted CM back late in day and therefore pt appointment could not be set up.  Clinic is closed today 02/09/15.  CM provided pt [facility] [employer] that provided address and phone number.  Pt stated he knew where the clinic is located.  CM instructed pt to call and make appointment Monday morning.  Pt stated [facility] preferred [facility] [place], [place] contacted pharmacy and verified medication is available to be filled at this time. CM verified pt has Brilinta free 30 day card.  CM requested bedside nurse to insure MD completed [employer] form placed in chart 02/06/15.   CM instructed pt to take filled out sheet to clinic appointment to help with medication assistance.  No additional CM needs.    Additional Comments: ur review done, left pt 30day free and copay assist card for brilinta. Left pt inform on [facility] since no ins listed and also [place] [facility] [place] Left  Pt assist form for brilinta on shadow chart.   Will cont to follow.  [name], RN 02/09/2015, 9:44 AM

## 2015-02-09 NOTE — Discharge Summary (Signed)
Discharge Summary   Patient ID: Randy Hebert,  MRN: [id number], DOB/AGE: [birth year] 49 y.o.  Admit date: 02/06/2015 Discharge date: 02/09/2015  Primary Care Provider: No PCP Per Patient Primary Cardiologist: Randy (seen by Dr. Ellyn Hebert), however will followup with Randy Hebert  Discharge Diagnoses Principal Problem:   ST elevation myocardial infarction (STEMI) of lateral wall, initial episode of care Active Problems:   Sinus pause secondary to beta blocker   Essential hypertension   Allergies Allergies  Allergen Reactions  . Amoxicillin Nausea And Vomiting  . Carvedilol Other (See Comments)    Sinus pause on telemetry >3 seconds. Longest one 9 sec. No AV nodal agent    Procedures  Echocardiogram 02/06/2015 - Left ventricle: Moderately severe hypokinesis of mid anterolateral segment, apical lateral segment, mid/apical anterior segments. EF is 40%. Randy cavity size was normal. Wall thickness was increased in a pattern of mild LVH. - Aortic valve: Sclerosis without stenosis. There was no significant regurgitation. - Right ventricle: Randy cavity size was normal. Systolic function was normal.     Cardiac catheterization 02/06/2015 1. 1st Diag lesion, 100% stenosed. A Promus Premier 2.5 mm x 16 mm drug-eluting stent was placed. There is a 0% residual stenosis post intervention. 2. Ramus lesion, 70% stenosed. 3. Mid RCA lesion, 40% stenosed. Dist RCA lesion, 45% stenosed. 4. Mild to moderately reduced LVEF with anterolateral and apical hypokinesis and elevated LVEDP  Abnormal anatomy with equal sized Diagonal and LAD branch but extensive RCA. Successful PCI of Randy First Diagonal Branch.  Recommendations:  Standard post radial cath/PCI TR band removal  Dual independent therapy for minimum one year.  Check 2-D echocardiogram to better assess EF  Add statin and beta blocker. Smoking cessation counseling. Cardiac Rehabilitation And Case Management Consultation  If  hemodynamic stable, would consider fast-track discharge  Would consider noninvasive evaluation of Randy Ramus Intermedius lesion in Randy absence of any recurrent anginal pain.  Likely benefit from follow-up in Randy Randy Hebert office.     Hebert Course  Randy patient is a 49 year old male with no past cardiac history who presented to Randy [facility]bert on 02/06/2015 with chest pain that started around 2:30 AM on Randy day of admission. Initial EKG showed ST elevation in lead I and aVL with depression in lead II, III and aVF consistent with lateral MI. Code STEMI was called around 4:56 AM. He was transferred to Randy Hebert for emergent cardiac catheterization. He arrived around 6:08 AM. At Randy time of arrival, he was already on IV heparin and has been given nitroglycerin and IV morphine. Cardiac catheterization showed 100% D1 occlusion treated with Promus Premier 2.5 mm x 16 mm DES, 70% ramus stenosis, 40% mid RCA stenosis, 45% distal RCA stenosis, EF 45-50%. Echocardiogram was done on Randy same day showed EF 40%, moderately severe hypokinesis of mid anterolateral segment, mild LVH, normal RVEF. Post cath, he was placed on aspirin, Brilinta and beta blocker. He was transferred out of Randy ICU on 6/8. It was originally planned for Randy patient to be discharged on 02/08/2015, however overnight, he had 3 episodes of high-grade heart block one of which is consistent with prolonged sinus pause lasting 9 seconds. His beta blocker was stopped. And he was kept an additional 24 hours for observation. Surprisingly, he has was asymptomatic during Randy event.  He was seen in Randy morning of 02/09/2015, at which time she denies any significant chest discomfort or shortness of breath. He has been seen by cardiac rehabilitation. Telemetry overnight showed a single  sinus pause lasting less than 2.57, otherwise his heart rate has been stable between 60-90 bpm. He he is deemed stable for discharge from cardiology perspective. I  will arrange Randy Hebert cardiology follow-up in Randy Hebert. Repeated emphasis has been placed on compliance with DAPT.    Discharge Vitals Blood pressure 120/71, pulse 79, temperature 97.6 F (36.4 C), temperature source Oral, resp. rate 18, height '5\' 5"'$  (1.651 m), weight 200 lb 13.4 oz (91.1 kg), SpO2 97 %.  Filed Weights   02/06/15 0444 02/06/15 0808  Weight: 200 lb (90.719 kg) 200 lb 13.4 oz (91.1 kg)    Labs  CBC  Recent Labs  02/07/15 0130  WBC 11.6*  HGB 15.5  HCT 46.7  MCV 87.1  PLT 683   Basic Metabolic Panel  Recent Labs  02/07/15 0130 02/08/15 1424  NA 134* 135  K 4.1 4.2  CL 101 102  CO2 25 24  GLUCOSE 109* 134*  BUN 11 16  CREATININE 0.89 1.18  CALCIUM 8.9 9.0   Cardiac Enzymes  Recent Labs  02/08/15 1915 02/09/15 0124 02/09/15 0722  TROPONINI 6.36* 6.94* 3.98*    Disposition  Pt is being discharged home today in good condition.  Follow-up Plans & Appointments      Follow-up Information    Follow up with Randy Hebert. Call on 02/12/2015.   Why:  Call and set up inital medical doctor appointment, and tell them you need Brilinta assistance, take [place] provided by Hebert at discharge   Contact information:   [facility]  [address] [place], [place] [address]  [phone]      Follow up with Randy Sims, NP On 02/16/2015.   Specialties:  Nurse Practitioner, Radiology, Cardiology   Why:  2:10pm.    Contact information:   [place] [address] [phone]       Discharge Medications    Medication List    STOP taking these medications        naproxen sodium 220 MG tablet  Commonly known as:  ANAPROX      TAKE these medications        aspirin EC 81 MG tablet  Take 81 mg by mouth daily.     atorvastatin 80 MG tablet  Commonly known as:  LIPITOR  Take 1 tablet (80 mg total) by mouth daily at 6 PM.     lisinopril 5 MG tablet  Commonly  known as:  PRINIVIL,ZESTRIL  Take 1 tablet (5 mg total) by mouth daily.     MUSCLE RUB 10-15 % Crea  Apply 1 application topically as needed for muscle pain.     nitroGLYCERIN 0.4 MG SL tablet  Commonly known as:  NITROSTAT  Place 1 tablet (0.4 mg total) under Randy tongue every 5 (five) minutes as needed for chest pain.     oxymetazoline 0.05 % nasal spray  Commonly known as:  AFRIN  Place 1 spray into both nostrils 2 (two) times daily as needed for congestion.     ticagrelor 90 MG Tabs tablet  Commonly known as:  BRILINTA  Take 1 tablet (90 mg total) by mouth 2 (two) times daily.          Duration of Discharge Encounter   Greater than 30 minutes including physician time.  Hilbert Corrigan PA-C Pager: [phone] 02/09/2015, 12:32 PM

## 2015-02-09 NOTE — Discharge Instructions (Signed)
Acute Coronary Syndrome  Acute coronary syndrome (ACS) is an urgent problem in which the blood and oxygen supply to the heart is critically deficient. ACS requires hospitalization because one or more coronary arteries may be blocked.  ACS represents a range of conditions including:  · Previous angina that is now unstable, lasts longer, happens at rest, or is more intense.  · A heart attack, with heart muscle cell injury and death.  There are three vital coronary arteries that supply the heart muscle with blood and oxygen so that it can pump blood effectively. If blockages to these arteries develop, blood flow to the heart muscle is reduced. If the heart does not get enough blood, angina may occur as the first warning sign.  SYMPTOMS   · The most common signs of angina include:  ¨ Tightness or squeezing in the chest.  ¨ Feeling of heaviness on the chest.  ¨ Discomfort in the arms, neck, back, or jaw.  ¨ Shortness of breath and nausea.  ¨ Cold, wet skin.  · Angina is usually brought on by physical effort or excitement which increase the oxygen needs of the heart. These states increase the blood flow needs of the heart beyond what can be delivered.  · Other symptoms that are not as common include:  ¨ Fatigue  ¨ Unexplained feelings of nervousness or anxiety  ¨ Weakness  ¨ Diarrhea  · Sometimes, you may not have noticed any symptoms at all but still suffered a cardiac injury.  TREATMENT   · Medicines to help discomfort may include nitroglycerin (nitro) in the form of tablets or a spray for rapid relief, or longer-acting forms such as cream, patches, or capsules. (Be aware that there are many side effects and possible interactions with other drugs).  · Other medicines may be used to help the heart pump better.  · Procedures to open blocked arteries including angioplasty or stent placement to keep the arteries open.  · Open heart surgery may be needed when there are many blockages or they are in critical locations that  are best treated with surgery.  HOME CARE INSTRUCTIONS   · Do not use any tobacco products including cigarettes, chewing tobacco, or electronic cigarettes.  · Take one baby or adult aspirin daily, if your health care provider advises. This helps reduce the risk of a heart attack.  · It is very important that you follow the angina treatment prescribed by your health care provider. Make arrangements for proper follow-up care.  · Eat a heart healthy diet with salt and fat restrictions as advised.  · Regular exercise is good for you as long as it does not cause discomfort. Do not begin any new type of exercise until you check with your health care provider.  · If you are overweight, you should lose weight.  · Try to maintain normal blood lipid levels.  · Keep your blood pressure under control as recommended by your health care provider.  · You should tell your health care provider right away about any increase in the severity or frequency of your chest discomfort or angina attacks. When you have angina, you should stop what you are doing and sit down. This may bring relief in 3 to 5 minutes. If your health care provider has prescribed nitro, take it as directed.  · If your health care provider has given you a follow-up appointment, it is very important to keep that appointment. Not keeping the appointment could result in a chronic or   of breath.  You feel faint, lightheaded, or pass out.  Your chest discomfort gets worse.  You are sweating or experience sudden profound fatigue.  You do not get relief of your chest pain after 3 doses of nitro.  Your discomfort lasts longer than 15 minutes. MAKE SURE YOU:   Understand these instructions.  Will watch your condition.  Will get help right  away if you are not doing well or get worse.  Take all medicines as directed by your health care provider. Document Released: 08/18/2005 Document Revised: 08/23/2013 Document Reviewed: 12/20/2013 [facility] Patient Information 2015 [place], [place]. This information is not intended to replace advice given to you by your health care provider. Make sure you discuss any questions you have with your health care provider.  No driving for [id number]. No lifting over 5 lbs for 1 week. No sexual activity for 1 week. You may return to work on 02/12/2015. Keep procedure site clean & dry. If you notice increased pain, swelling, bleeding or pus, call/return!  You may shower, but no soaking baths/hot tubs/pools for 1 week.

## 2015-02-12 ENCOUNTER — Telehealth: Payer: Self-pay | Admitting: Cardiology

## 2015-02-12 NOTE — Telephone Encounter (Signed)
Pt needs a TCM phone call He will be seeing Marita Snellen in [place] on 6/17 at 2:10pm  Thanks

## 2015-02-12 NOTE — Telephone Encounter (Signed)
Left message for patient to call.

## 2015-02-14 NOTE — Telephone Encounter (Signed)
Left message for patient to call.

## 2015-02-16 ENCOUNTER — Ambulatory Visit (INDEPENDENT_AMBULATORY_CARE_PROVIDER_SITE_OTHER): Payer: Self-pay | Admitting: Adult Health

## 2015-02-16 ENCOUNTER — Encounter: Payer: Self-pay | Admitting: Adult Health

## 2015-02-16 VITALS — BP 138/82 | HR 89 | Ht 64.0 in | Wt 199.0 lb

## 2015-02-16 DIAGNOSIS — I2510 Atherosclerotic heart disease of native coronary artery without angina pectoris: Secondary | ICD-10-CM

## 2015-02-16 MED ORDER — ROSUVASTATIN CALCIUM 10 MG PO TABS: 10.0000 mg | ORAL_TABLET | Freq: Every day | ORAL | Status: DC

## 2015-02-16 NOTE — Progress Notes (Signed)
Cardiology Office Note   Date:  02/16/2015   ID:  Randy Hebert, DOB 02-23-1966, MRN [id number]  PCP:  No PCP Per Patient  Cardiologist:  To be est/ Jory Sims, NP   Chief Complaint  Patient presents with  . Coronary Artery Disease  . Hypertension      History of Present Illness: Randy Hebert is a 49 y.o. male who presents for ongoing assessment and management of CAD, with recent hospitalization in the setting of ST elevation MI in the lateral wall, hypertension, and sinus pause, secondary to beta blocker.  During hospitalization, the patient had cardiac catheterization, which revealed a first diagonal lesion of 100% stenosis, and was provided with a promise per meter 2.5 x 16 mm drug-eluting stent.  The patient had a ramus lesion of 70%, mid RCA lesion 40% stenosis distal RCA lesion 45% stenosis.  Mild to moderately reduced LVEF with anterior, lateral and apical hypokinesis with an elevated LVEDP.  The patient was started on dual antiplatelet therapy and will remain on this for a minimum of one year.  The patient did have evidence of sinus pause lasting less than 2.5 7 seconds otherwise the heart rate was stable between 60 and 90 8 per minute.  The patient was discharged on lisinopril and Brilinta with ASA. No BB due to bradycardia.   He comes today after being followed in the [facility] clinic with medications provided.  He is requesting change from Lipitor to Crestor as free clinic and provide him with that.  Instead.  She states she was outside working in the heat today and became very lightheaded and dizzy, and vomited.  He denies any chest pain or palpitations.  He unfortunately continues to smoke and this was from cigars to light cigarettes. He is trying to cut down.  He is unable to afford cardiac rehabilitation as he has no insurance.his been having some GERD symptoms since starting his new medicines.  Past Medical History  Diagnosis Date  . Neck pain   . Otitis media   .  Pleurisy   . CAD (coronary artery disease)     lateral STEMI 02/06/2015 00% D1 occlusion treated with Promus Premier 2.5 mm x 16 mm DES, 70% ramus stenosis, 40% mid RCA stenosis, 45% distal RCA stenosis, EF 45-50%  . Sinus pause     9 sec sinus pause on telemetry after started on coreg after MI, avoid AV nodal blocking agent    Past Surgical History  Procedure Laterality Date  . Incision / drainage hand / finger    . Cardiac catheterization N/A 02/06/2015    Procedure: Left Heart Cath and Coronary Angiography;  Surgeon: Leonie Man, MD;  Location: [facility];  Service: Cardiovascular;  Laterality: N/A;  . Cardiac catheterization N/A 02/06/2015    Procedure: Coronary Stent Intervention;  Surgeon: Leonie Man, MD;  Location: [facility];  Service: Cardiovascular;  Laterality: N/A;     Current Outpatient Prescriptions  Medication Sig Dispense Refill  . aspirin EC 81 MG tablet Take 81 mg by mouth daily.    Marland Kitchen atorvastatin (LIPITOR) 80 MG tablet Take 1 tablet (80 mg total) by mouth daily at 6 PM. 30 tablet 11  . lisinopril (PRINIVIL,ZESTRIL) 5 MG tablet Take 1 tablet (5 mg total) by mouth daily. 30 tablet 11  . Menthol-Methyl Salicylate (MUSCLE RUB) 10-15 % CREA Apply 1 application topically as needed for muscle pain.    . nitroGLYCERIN (NITROSTAT) 0.4 MG SL tablet  Place 1 tablet (0.4 mg total) under the tongue every 5 (five) minutes as needed for chest pain. 25 tablet 3  . oxymetazoline (AFRIN) 0.05 % nasal spray Place 1 spray into both nostrils 2 (two) times daily as needed for congestion.    . ticagrelor (BRILINTA) 90 MG TABS tablet Take 1 tablet (90 mg total) by mouth 2 (two) times daily. 60 tablet 11   No current facility-administered medications for this visit.    Allergies:   Amoxicillin and Carvedilol    Social History:  The patient  reports that he has been smoking Cigars.  He does not have any smokeless tobacco history on file. He reports that he drinks alcohol.  He reports that he does not use illicit drugs.   Family History:  The patient's family history includes Heart attack in his father; Stroke in his father.    ROS: .   All other systems are reviewed and negative.Unless otherwise mentioned in H&P above.   PHYSICAL EXAM: VS:  There were no vitals taken for this visit. , BMI There is no weight on file to calculate BMI. GEN: Well nourished, well developed, in no acute distress HEENT: normal Neck: no JVD, carotid bruits, or masses Cardiac: RRR; no murmurs, rubs, or gallops,no edema  Respiratory:  clear to auscultation bilaterally, normal work of breathing GI: soft, nontender, nondistended, + BS, obese. MS: no deformity or atrophy Skin: warm and dry, no rash Neuro:  Strength and sensation are intact Psych: euthymic mood, full affect   Recent Labs: 02/06/2015: ALT 22; Magnesium 1.8 02/07/2015: Hemoglobin 15.5; Platelets 281 02/08/2015: BUN 16; Creatinine, Ser 1.18; Potassium 4.2; Sodium 135    Lipid Panel No results found for: CHOL, TRIG, HDL, CHOLHDL, VLDL, LDLCALC, LDLDIRECT    Wt Readings from Last 3 Encounters:  02/06/15 200 lb 13.4 oz (91.1 kg)  06/20/13 198 lb 9.6 oz (90.084 kg)  04/20/13 205 lb (92.987 kg)      Other studies Reviewed: Additional studies/ records that were reviewed today include: None Review of the above records demonstrates: N/A   ASSESSMENT AND PLAN:  1. CAD: S/P DES to the 1st diagonal: He is unable to take BB due to severe bradycardia. He remains on ACE, ASA, Brilinta,  and statin. He is advised not to work outside in the heat in temperatures greater than 85 degrees. He verbalizes understanding. He is to not over work on the exercise and take his time increasing activity as he is unable to afford cardiac rehab. Change lipitor to crestor.   2. ICM: Reduced EF: EF of 40%. He is to continue BB. He is not on diuretic at this time. He will have follow up echo in 3 months with BMET.   3. Tobacco abuse:  Counseled on smoking cessation. He is in contemplating decreasing his use and wean off of cigarettes. He states that cigarettes help him to relax. I have recommended other forms of healthy relaxation and have encouraged him to find alternative ways to do so without cigarettes.    Current medicines are reviewed at length with the patient today.    Labs/ tests ordered today include: BMET.  No orders of the defined types were placed in this encounter.     Disposition:   FU with 3 months. Signed, Jory Sims, NP  02/16/2015 7:24 AM    [facility] [address], [place], [place] [address] Phone: [phone]; Fax: [phone]

## 2015-02-16 NOTE — Progress Notes (Deleted)
Name: Randy Hebert    DOB: Jan 24, 1966  Age: 49 y.o.  MR#: [id number]       PCP:  No PCP Per Patient      Insurance: Payor: MEDICAID POTENTIAL / Plan: MEDICAID POTENTIAL / Product Type: *No Product type* /   CC:    Chief Complaint  Patient presents with  . Coronary Artery Disease  . Hypertension    VS Filed Vitals:   02/16/15 1412  BP: 138/82  Pulse: 89  Height: '5\' 4"'$  (1.626 m)  Weight: 199 lb (90.266 kg)  SpO2: 98%    Weights Current Weight  02/16/15 199 lb (90.266 kg)  02/06/15 200 lb 13.4 oz (91.1 kg)  06/20/13 198 lb 9.6 oz (90.084 kg)    Blood Pressure  BP Readings from Last 3 Encounters:  02/16/15 138/82  02/09/15 120/71  06/20/13 147/92     Admit date:  (Not on file) Last encounter with RMR:  Visit date not found   Allergy Amoxicillin and Carvedilol  Current Outpatient Prescriptions  Medication Sig Dispense Refill  . aspirin EC 81 MG tablet Take 81 mg by mouth daily.    Marland Kitchen atorvastatin (LIPITOR) 80 MG tablet Take 1 tablet (80 mg total) by mouth daily at 6 PM. 30 tablet 11  . lisinopril (PRINIVIL,ZESTRIL) 5 MG tablet Take 1 tablet (5 mg total) by mouth daily. 30 tablet 11  . Menthol-Methyl Salicylate (MUSCLE RUB) 10-15 % CREA Apply 1 application topically as needed for muscle pain.    . nitroGLYCERIN (NITROSTAT) 0.4 MG SL tablet Place 1 tablet (0.4 mg total) under the tongue every 5 (five) minutes as needed for chest pain. 25 tablet 3  . oxymetazoline (AFRIN) 0.05 % nasal spray Place 1 spray into both nostrils 2 (two) times daily as needed for congestion.    . ticagrelor (BRILINTA) 90 MG TABS tablet Take 1 tablet (90 mg total) by mouth 2 (two) times daily. 60 tablet 11   No current facility-administered medications for this visit.    Discontinued Meds:   There are no discontinued medications.  Patient Active Problem List   Diagnosis Date Noted  . Sinus pause secondary to beta blocker 02/09/2015  . Essential hypertension 02/09/2015  . ST elevation  myocardial infarction (STEMI) of lateral wall, initial episode of care 02/06/2015    Class: Hospitalized for    LABS    Component Value Date/Time   NA 135 02/08/2015 1424   NA 134* 02/07/2015 0130   NA 140 02/06/2015 0445   K 4.2 02/08/2015 1424   K 4.1 02/07/2015 0130   K 3.9 02/06/2015 0445   CL 102 02/08/2015 1424   CL 101 02/07/2015 0130   CL 105 02/06/2015 0445   CO2 24 02/08/2015 1424   CO2 25 02/07/2015 0130   CO2 24 02/06/2015 0445   GLUCOSE 134* 02/08/2015 1424   GLUCOSE 109* 02/07/2015 0130   GLUCOSE 108* 02/06/2015 0445   BUN 16 02/08/2015 1424   BUN 11 02/07/2015 0130   BUN 20 02/06/2015 0445   CREATININE 1.18 02/08/2015 1424   CREATININE 0.89 02/07/2015 0130   CREATININE 0.95 02/06/2015 0445   CALCIUM 9.0 02/08/2015 1424   CALCIUM 8.9 02/07/2015 0130   CALCIUM 8.7* 02/06/2015 0445   GFRNONAA >60 02/08/2015 1424   GFRNONAA >60 02/07/2015 0130   GFRNONAA >60 02/06/2015 0445   GFRAA >60 02/08/2015 1424   GFRAA >60 02/07/2015 0130   GFRAA >60 02/06/2015 0445   CMP     Component Value  Date/Time   NA 135 02/08/2015 1424   K 4.2 02/08/2015 1424   CL 102 02/08/2015 1424   CO2 24 02/08/2015 1424   GLUCOSE 134* 02/08/2015 1424   BUN 16 02/08/2015 1424   CREATININE 1.18 02/08/2015 1424   CALCIUM 9.0 02/08/2015 1424   PROT 6.9 02/06/2015 0445   ALBUMIN 3.8 02/06/2015 0445   AST 19 02/06/2015 0445   ALT 22 02/06/2015 0445   ALKPHOS 93 02/06/2015 0445   BILITOT 0.3 02/06/2015 0445   GFRNONAA >60 02/08/2015 1424   GFRAA >60 02/08/2015 1424       Component Value Date/Time   WBC 11.6* 02/07/2015 0130   WBC 9.6 02/06/2015 0445   WBC 7.8 06/20/2013 1121   HGB 15.5 02/07/2015 0130   HGB 15.5 02/06/2015 0445   HGB 15.1 06/20/2013 1121   HCT 46.7 02/07/2015 0130   HCT 46.8 02/06/2015 0445   HCT 44.2 06/20/2013 1121   MCV 87.1 02/07/2015 0130   MCV 88.3 02/06/2015 0445   MCV 83.6 06/20/2013 1121    Lipid Panel  No results found for: CHOL, TRIG, HDL,  CHOLHDL, VLDL, LDLCALC, LDLDIRECT  ABG No results found for: PHART, PCO2ART, PO2ART, HCO3, TCO2, ACIDBASEDEF, O2SAT   No results found for: TSH BNP (last 3 results) No results for input(s): BNP in the last 8760 hours.  ProBNP (last 3 results) No results for input(s): PROBNP in the last 8760 hours.  Cardiac Panel (last 3 results) No results for input(s): CKTOTAL, CKMB, TROPONINI, RELINDX in the last 72 hours.  Iron/TIBC/Ferritin/ %Sat No results found for: IRON, TIBC, FERRITIN, IRONPCTSAT   EKG Orders placed or performed during the hospital encounter of 02/06/15  . EKG 12-Lead  . EKG 12-Lead  . ED EKG  . ED EKG  . EKG 12-Lead  . EKG 12-Lead  . Repeat EKG  . Repeat EKG  . EKG 12-Lead immediately post procedure  . EKG 12-Lead  . EKG 12-Lead immediately post procedure  . EKG  . EKG 12-Lead  . EKG 12-Lead  . EKG 12-Lead     Prior Assessment and Plan Problem List as of 02/16/2015    ST elevation myocardial infarction (STEMI) of lateral wall, initial episode of care   Sinus pause secondary to beta blocker   Essential hypertension       Imaging: Dg Chest Port 1 View  02/06/2015   CLINICAL DATA:  Awoke from sleep with crushing chest pressure.  EXAM: PORTABLE CHEST - 1 VIEW  COMPARISON:  None.  FINDINGS: The heart is at the upper limits of normal in size. Question right aortic arch. Mild hyperinflation and peribronchial cuffing. No confluent airspace disease. No pleural effusion or pneumothorax. No acute osseous abnormality.  IMPRESSION: 1. Borderline cardiomegaly.  Question of right aortic arch. 2. Mild hyperinflation and peribronchial cuffing. Can be seen in the setting of bronchitis, acute or chronic.   Electronically Signed   By: Jeb Levering M.D.   On: 02/06/2015 05:12

## 2015-02-16 NOTE — Patient Instructions (Addendum)
Your physician recommends that you schedule a follow-up appointment in: 3 months with Arnold Long NP    STOP Lipitor   START Crestor 10 mg daily at dinner   Take Zantac (Ranitidine)  150 mg daily   Lab work in 3 months; BMET     Thank you for choosing [facility] !

## 2015-02-20 ENCOUNTER — Telehealth: Payer: Self-pay | Admitting: Internal Medicine

## 2015-02-20 NOTE — Telephone Encounter (Signed)
Needs actual RX for Brillinta so he can take to [facility] so they can help with obtaining.  tg

## 2015-02-21 NOTE — Telephone Encounter (Signed)
Patient was returning Kisha's call. He said his phone hasn't been working very well but if we call him today at [phone] he said we can leave him an message and he will call us back around noon at his lunch break.

## 2015-02-21 NOTE — Telephone Encounter (Signed)
Patient states he will come by office at 5:05 to pick up Rx.

## 2015-03-14 ENCOUNTER — Telehealth: Payer: Self-pay | Admitting: Adult Health

## 2015-03-14 MED ORDER — TICAGRELOR 90 MG PO TABS
90.0000 mg | ORAL_TABLET | Freq: Two times a day (BID) | ORAL | Status: DC
Start: 2015-03-14 — End: 2015-07-11

## 2015-03-14 NOTE — Telephone Encounter (Signed)
Pt is needing his 41 day supply for Brilinta faxed to [facility] --fax [phone] phone [phone]

## 2015-03-14 NOTE — Telephone Encounter (Signed)
Sent to [facility]

## 2015-05-17 ENCOUNTER — Encounter: Payer: Self-pay | Admitting: Adult Health

## 2015-05-17 ENCOUNTER — Ambulatory Visit (INDEPENDENT_AMBULATORY_CARE_PROVIDER_SITE_OTHER): Payer: Self-pay | Admitting: Adult Health

## 2015-05-17 VITALS — BP 142/82 | HR 93 | Ht 65.5 in | Wt 215.4 lb

## 2015-05-17 DIAGNOSIS — I2510 Atherosclerotic heart disease of native coronary artery without angina pectoris: Secondary | ICD-10-CM

## 2015-05-17 DIAGNOSIS — I10 Essential (primary) hypertension: Secondary | ICD-10-CM

## 2015-05-17 NOTE — Progress Notes (Deleted)
Name: Randy Hebert    DOB: 26-Mar-1966  Age: 49 y.o.  MR#: [id number]       PCP:  No PCP Per Patient      Insurance: Payor: MEDICAID POTENTIAL / Plan: MEDICAID POTENTIAL / Product Type: *No Product type* /   CC:   No chief complaint on file.   VS Filed Vitals:   05/17/15 1329  BP: 142/82  Pulse: 93  Height: 5' 5.5" (1.664 m)  Weight: 215 lb 6.4 oz (97.705 kg)  SpO2: 97%    Weights Current Weight  05/17/15 215 lb 6.4 oz (97.705 kg)  02/16/15 199 lb (90.266 kg)  02/06/15 200 lb 13.4 oz (91.1 kg)    Blood Pressure  BP Readings from Last 3 Encounters:  05/17/15 142/82  02/16/15 138/82  02/09/15 120/71     Admit date:  (Not on file) Last encounter with RMR:  03/14/2015   Allergy Amoxicillin and Carvedilol  Current Outpatient Prescriptions  Medication Sig Dispense Refill  . aspirin EC 81 MG tablet Take 81 mg by mouth daily.    Marland Kitchen lisinopril (PRINIVIL,ZESTRIL) 5 MG tablet Take 1 tablet (5 mg total) by mouth daily. 30 tablet 11  . Menthol-Methyl Salicylate (MUSCLE RUB) 10-15 % CREA Apply 1 application topically as needed for muscle pain.    . nitroGLYCERIN (NITROSTAT) 0.4 MG SL tablet Place 1 tablet (0.4 mg total) under the tongue every 5 (five) minutes as needed for chest pain. 25 tablet 3  . oxymetazoline (AFRIN) 0.05 % nasal spray Place 1 spray into both nostrils 2 (two) times daily as needed for congestion.    . rosuvastatin (CRESTOR) 10 MG tablet Take 1 tablet (10 mg total) by mouth daily. 30 tablet 6  . ticagrelor (BRILINTA) 90 MG TABS tablet Take 1 tablet (90 mg total) by mouth 2 (two) times daily. 180 tablet 3   No current facility-administered medications for this visit.    Discontinued Meds:   There are no discontinued medications.  Patient Active Problem List   Diagnosis Date Noted  . Sinus pause secondary to beta blocker 02/09/2015  . Essential hypertension 02/09/2015  . ST elevation myocardial infarction (STEMI) of lateral wall, initial episode of care  02/06/2015    Class: Hospitalized for    LABS    Component Value Date/Time   NA 135 02/08/2015 1424   NA 134* 02/07/2015 0130   NA 140 02/06/2015 0445   K 4.2 02/08/2015 1424   K 4.1 02/07/2015 0130   K 3.9 02/06/2015 0445   CL 102 02/08/2015 1424   CL 101 02/07/2015 0130   CL 105 02/06/2015 0445   CO2 24 02/08/2015 1424   CO2 25 02/07/2015 0130   CO2 24 02/06/2015 0445   GLUCOSE 134* 02/08/2015 1424   GLUCOSE 109* 02/07/2015 0130   GLUCOSE 108* 02/06/2015 0445   BUN 16 02/08/2015 1424   BUN 11 02/07/2015 0130   BUN 20 02/06/2015 0445   CREATININE 1.18 02/08/2015 1424   CREATININE 0.89 02/07/2015 0130   CREATININE 0.95 02/06/2015 0445   CALCIUM 9.0 02/08/2015 1424   CALCIUM 8.9 02/07/2015 0130   CALCIUM 8.7* 02/06/2015 0445   GFRNONAA >60 02/08/2015 1424   GFRNONAA >60 02/07/2015 0130   GFRNONAA >60 02/06/2015 0445   GFRAA >60 02/08/2015 1424   GFRAA >60 02/07/2015 0130   GFRAA >60 02/06/2015 0445   CMP     Component Value Date/Time   NA 135 02/08/2015 1424   K 4.2 02/08/2015 1424  CL 102 02/08/2015 1424   CO2 24 02/08/2015 1424   GLUCOSE 134* 02/08/2015 1424   BUN 16 02/08/2015 1424   CREATININE 1.18 02/08/2015 1424   CALCIUM 9.0 02/08/2015 1424   PROT 6.9 02/06/2015 0445   ALBUMIN 3.8 02/06/2015 0445   AST 19 02/06/2015 0445   ALT 22 02/06/2015 0445   ALKPHOS 93 02/06/2015 0445   BILITOT 0.3 02/06/2015 0445   GFRNONAA >60 02/08/2015 1424   GFRAA >60 02/08/2015 1424       Component Value Date/Time   WBC 11.6* 02/07/2015 0130   WBC 9.6 02/06/2015 0445   WBC 7.8 06/20/2013 1121   HGB 15.5 02/07/2015 0130   HGB 15.5 02/06/2015 0445   HGB 15.1 06/20/2013 1121   HCT 46.7 02/07/2015 0130   HCT 46.8 02/06/2015 0445   HCT 44.2 06/20/2013 1121   MCV 87.1 02/07/2015 0130   MCV 88.3 02/06/2015 0445   MCV 83.6 06/20/2013 1121    Lipid Panel  No results found for: CHOL, TRIG, HDL, CHOLHDL, VLDL, LDLCALC, LDLDIRECT  ABG No results found for: PHART,  PCO2ART, PO2ART, HCO3, TCO2, ACIDBASEDEF, O2SAT   No results found for: TSH BNP (last 3 results) No results for input(s): BNP in the last 8760 hours.  ProBNP (last 3 results) No results for input(s): PROBNP in the last 8760 hours.  Cardiac Panel (last 3 results) No results for input(s): CKTOTAL, CKMB, TROPONINI, RELINDX in the last 72 hours.  Iron/TIBC/Ferritin/ %Sat No results found for: IRON, TIBC, FERRITIN, IRONPCTSAT   EKG Orders placed or performed during the hospital encounter of 02/06/15  . EKG 12-Lead  . EKG 12-Lead  . ED EKG  . ED EKG  . EKG 12-Lead  . EKG 12-Lead  . Repeat EKG  . Repeat EKG  . EKG 12-Lead immediately post procedure  . EKG 12-Lead  . EKG 12-Lead immediately post procedure  . EKG  . EKG 12-Lead  . EKG 12-Lead  . 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

## 2015-05-17 NOTE — Progress Notes (Signed)
Cardiology Office Note   Date:  05/17/2015   ID:  Randy Hebert, DOB [birth year], MRN [id number]  PCP:  No PCP Per Patient  Cardiologist:  (Est) Jory Sims, NP   Chief Complaint  Patient presents with  . Coronary Artery Disease  . Hypertension      History of Present Illness: Randy Hebert is a 49 y.o. male who presents for ongoing assessment and management of CAD, with recent hospitalization in the setting of ST elevation MI in the lateral wall, hypertension, and sinus pause, secondary to beta blocker.   During hospitalization, the patient had cardiac catheterization, which revealed a first diagonal lesion of 100% stenosis, and was provided with a promise per meter 2.5 x 16 mm drug-eluting stent. The patient had a ramus lesion of 70%, mid RCA lesion 40% stenosis distal RCA lesion 45% stenosis. Mild to moderately reduced LVEF with anterior, lateral and apical hypokinesis with an elevated LVEDP. The patient was started on dual antiplatelet therapy and will remain on this for a minimum of one year. The patient did have evidence of sinus pause lasting less than 2.5 7 seconds otherwise the heart rate was stable between 60 and 90 8 per minute. The patient was discharged on lisinopril and Brilinta with ASA. No BB due to bradycardia.   He comes today without cardiac complaint. He is adherent to his medication regimen. He denies chest pain. He is followed by the [facility] for labs and medications. He has some complaints of right hip pain. He unfortunately continues to smoke 1ppd.     Past Medical History  Diagnosis Date  . Neck pain   . Otitis media   . Pleurisy   . CAD (coronary artery disease)     lateral STEMI 02/06/2015 00% D1 occlusion treated with Promus Premier 2.5 mm x 16 mm DES, 70% ramus stenosis, 40% mid RCA stenosis, 45% distal RCA stenosis, EF 45-50%  . Sinus pause     9 sec sinus pause on telemetry after started on coreg after MI, avoid AV nodal blocking agent     Past Surgical History  Procedure Laterality Date  . Incision / drainage hand / finger    . Cardiac catheterization N/A 02/06/2015    Procedure: Left Heart Cath and Coronary Angiography;  Surgeon: Leonie Man, MD;  Location: [place] [facility];  Service: Cardiovascular;  Laterality: N/A;  . Cardiac catheterization N/A 02/06/2015    Procedure: Coronary Stent Intervention;  Surgeon: Leonie Man, MD;  Location: [place] [facility];  Service: Cardiovascular;  Laterality: N/A;     Current Outpatient Prescriptions  Medication Sig Dispense Refill  . aspirin EC 81 MG tablet Take 81 mg by mouth daily.    Marland Kitchen lisinopril (PRINIVIL,ZESTRIL) 5 MG tablet Take 1 tablet (5 mg total) by mouth daily. 30 tablet 11  . Menthol-Methyl Salicylate (MUSCLE RUB) 10-15 % CREA Apply 1 application topically as needed for muscle pain.    . nitroGLYCERIN (NITROSTAT) 0.4 MG SL tablet Place 1 tablet (0.4 mg total) under the tongue every 5 (five) minutes as needed for chest pain. 25 tablet 3  . oxymetazoline (AFRIN) 0.05 % nasal spray Place 1 spray into both nostrils 2 (two) times daily as needed for congestion.    . rosuvastatin (CRESTOR) 10 MG tablet Take 1 tablet (10 mg total) by mouth daily. 30 tablet 6  . ticagrelor (BRILINTA) 90 MG TABS tablet Take 1 tablet (90 mg total) by mouth 2 (two) times daily. [place]  tablet 3   No current facility-administered medications for this visit.    Allergies:   Amoxicillin and Carvedilol    Social History:  The patient  reports that he has been smoking Cigars.  He does not have any smokeless tobacco history on file. He reports that he drinks alcohol. He reports that he does not use illicit drugs.   Family History:  The patient's family history includes Heart attack in his father; Stroke in his father.    ROS: All other systems are reviewed and negative. Unless otherwise mentioned in H&P    PHYSICAL EXAM: VS:  BP 142/82 mmHg  Pulse 93  Ht 5' 5.5" (1.664 m)  Wt 215 lb 6.4  oz (97.705 kg)  BMI 35.29 kg/m2  SpO2 97% , BMI Body mass index is 35.29 kg/(m^2). GEN: Well nourished, well developed, in no acute distress HEENT: normal Neck: no JVD, carotid bruits, or masses Cardiac: RRR; no murmurs, rubs, or gallops,no edema  Respiratory:  clear to auscultation bilaterally, normal work of breathing GI: soft, nontender, nondistended, + BS MS: no deformity or atrophy Skin: warm and dry, no rash Neuro:  Strength and sensation are intact Psych: euthymic mood, full affect    Recent Labs: 02/06/2015: ALT 22; Magnesium 1.8 02/07/2015: Hemoglobin 15.5; Platelets 281 02/08/2015: BUN 16; Creatinine, Ser 1.18; Potassium 4.2; Sodium 135    Lipid Panel No results found for: CHOL, TRIG, HDL, CHOLHDL, VLDL, LDLCALC, LDLDIRECT    Wt Readings from Last 3 Encounters:  05/17/15 215 lb 6.4 oz (97.705 kg)  02/16/15 199 lb (90.266 kg)  02/06/15 200 lb 13.4 oz (91.1 kg)      ASSESSMENT AND PLAN:  1.  CAD: He is doing well and is medically compliant. He will continue current regimen as directed. [facility] I will not make any changes at this time. He will continue Plavix indefinitely in the setting of multivessel disease.   2. Ongoing tobacco abuse: I have spent 5-10 minutes discussing smoking cessation, and the risks of having another cardiac occurrence if he continues. He verbalizes understanding.   3. Hypertension: Slightly elevated today. He has been normotensive in the past. I will monitor and not make changes at this time.    Current medicines are reviewed at length with the patient today.    Labs/ tests ordered today include: Labs per [facility] next month. No orders of the defined types were placed in this encounter.     Disposition:   FU with 6 months To be established with Dr. Bronson Ing.Olena Heckle, NP  05/17/2015 1:58 PM    [facility] [address] [place], [place] [address] Phone:  [phone]; Fax: [phone]

## 2015-05-17 NOTE — Patient Instructions (Signed)
Your physician wants you to follow-up in: 6 months with Dr. Bronson Ing. You will receive a reminder letter in the mail two months in advance. If you don't receive a letter, please call our office to schedule the follow-up appointment.  Your physician recommends that you continue on your current medications as directed. Please refer to the Current Medication list given to you today.  Please have a copy of you next Labs faxed to our office.   Thank you for choosing [facility]!  Smoking Cessation Quitting smoking is important to your health and has many advantages. However, it is not always easy to quit since nicotine is a very addictive drug. Oftentimes, people try 3 times or more before being able to quit. This document explains the best ways for you to prepare to quit smoking. Quitting takes hard work and a lot of effort, but you can do it. ADVANTAGES OF QUITTING SMOKING  You will live longer, feel better, and live better.  Your body will feel the impact of quitting smoking almost immediately.  Within 20 minutes, blood pressure decreases. Your pulse returns to its normal level.  After 8 hours, carbon monoxide levels in the blood return to normal. Your oxygen level increases.  After 24 hours, the chance of having a heart attack starts to decrease. Your breath, hair, and body stop smelling like smoke.  After 48 hours, damaged nerve endings begin to recover. Your sense of taste and smell improve.  After 72 hours, the body is virtually free of nicotine. Your bronchial tubes relax and breathing becomes easier.  After 2 to 12 weeks, lungs can hold more air. Exercise becomes easier and circulation improves.  The risk of having a heart attack, stroke, cancer, or lung disease is greatly reduced.  After 1 year, the risk of coronary heart disease is cut in half.  After 5 years, the risk of stroke falls to the same as a nonsmoker.  After 10 years, the risk of lung cancer is cut in  half and the risk of other cancers decreases significantly.  After 15 years, the risk of coronary heart disease drops, usually to the level of a nonsmoker.  If you are pregnant, quitting smoking will improve your chances of having a healthy baby.  The people you live with, especially any children, will be healthier.  You will have extra money to spend on things other than cigarettes. QUESTIONS TO THINK ABOUT BEFORE ATTEMPTING TO QUIT You may want to talk about your answers with your health care provider.  Why do you want to quit?  If you tried to quit in the past, what helped and what did not?  What will be the most difficult situations for you after you quit? How will you plan to handle them?  Who can help you through the tough times? Your family? Friends? A health care provider?  What pleasures do you get from smoking? What ways can you still get pleasure if you quit? Here are some questions to ask your health care provider:  How can you help me to be successful at quitting?  What medicine do you think would be best for me and how should I take it?  What should I do if I need more help?  What is smoking withdrawal like? How can I get information on withdrawal? GET READY  Set a quit date.  Change your environment by getting rid of all cigarettes, ashtrays, matches, and lighters in your home, car, or work. Do not  let people smoke in your home.  Review your past attempts to quit. Think about what worked and what did not. GET SUPPORT AND ENCOURAGEMENT You have a better chance of being successful if you have help. You can get support in many ways.  Tell your family, friends, and coworkers that you are going to quit and need their support. Ask them not to smoke around you.  Get individual, group, or telephone counseling and support. Programs are available at [employer] and health centers. Call your local health department for information about programs in your  area.  Spiritual beliefs and practices may help some smokers quit.  Download a "quit meter" on your computer to keep track of quit statistics, such as how long you have gone without smoking, cigarettes not smoked, and money saved.  Get a self-help book about quitting smoking and staying off tobacco. [place] yourself from urges to smoke. Talk to someone, go for a walk, or occupy your time with a task.  Change your normal routine. Take a different route to work. Drink tea instead of coffee. Eat breakfast in a different place.  Reduce your stress. Take a hot bath, exercise, or read a book.  Plan something enjoyable to do every day. Reward yourself for not smoking.  Explore interactive web-based programs that specialize in helping you quit. GET MEDICINE AND USE IT CORRECTLY Medicines can help you stop smoking and decrease the urge to smoke. Combining medicine with the above behavioral methods and support can greatly increase your chances of successfully quitting smoking.  Nicotine replacement therapy helps deliver nicotine to your body without the negative effects and risks of smoking. Nicotine replacement therapy includes nicotine gum, lozenges, inhalers, nasal sprays, and skin patches. Some may be available over-the-counter and others require a prescription.  Antidepressant medicine helps people abstain from smoking, but how this works is unknown. This medicine is available by prescription.  Nicotinic receptor partial agonist medicine simulates the effect of nicotine in your brain. This medicine is available by prescription. Ask your health care provider for advice about which medicines to use and how to use them based on your health history. Your health care provider will tell you what side effects to look out for if you choose to be on a medicine or therapy. Carefully read the information on the package. Do not use any other product containing nicotine while  using a nicotine replacement product.  RELAPSE OR DIFFICULT SITUATIONS Most relapses occur within the first 3 months after quitting. Do not be discouraged if you start smoking again. Remember, most people try several times before finally quitting. You may have symptoms of withdrawal because your body is used to nicotine. You may crave cigarettes, be irritable, feel very hungry, cough often, get headaches, or have difficulty concentrating. The withdrawal symptoms are only temporary. They are strongest when you first quit, but they will go away within 10-14 days. To reduce the chances of relapse, try to:  Avoid drinking alcohol. Drinking lowers your chances of successfully quitting.  Reduce the amount of caffeine you consume. Once you quit smoking, the amount of caffeine in your body increases and can give you symptoms, such as a rapid heartbeat, sweating, and anxiety.  Avoid smokers because they can make you want to smoke.  Do not let weight gain distract you. Many smokers will gain weight when they quit, usually less than 10 pounds. Eat a healthy diet and stay active. You can always lose the  weight gained after you quit.  Find ways to improve your mood other than smoking. FOR MORE INFORMATION  www.smokefree.gov  Document Released: 08/12/2001 Document Revised: 01/02/2014 Document Reviewed: 11/27/2011 [facility] Patient Information 2015 [place], [place]. This information is not intended to replace advice given to you by your health care provider. Make sure you discuss any questions you have with your health care provider.

## 2015-05-30 ENCOUNTER — Encounter (HOSPITAL_COMMUNITY): Payer: Self-pay | Admitting: Emergency Medicine

## 2015-05-30 ENCOUNTER — Inpatient Hospital Stay (HOSPITAL_COMMUNITY)
Admission: EM | Admit: 2015-05-30 | Discharge: 2015-06-04 | DRG: 915 | Disposition: A | Discharge status: Home / Self Care | Payer: Self-pay | Attending: Family Medicine | Admitting: Family Medicine

## 2015-05-30 ENCOUNTER — Emergency Department (HOSPITAL_COMMUNITY): Payer: Self-pay

## 2015-05-30 DIAGNOSIS — Z6834 Body mass index (BMI) 34.0-34.9, adult: Secondary | ICD-10-CM

## 2015-05-30 DIAGNOSIS — I5022 Chronic systolic (congestive) heart failure: Secondary | ICD-10-CM

## 2015-05-30 DIAGNOSIS — I5023 Acute on chronic systolic (congestive) heart failure: Secondary | ICD-10-CM

## 2015-05-30 DIAGNOSIS — Z01818 Encounter for other preprocedural examination: Secondary | ICD-10-CM

## 2015-05-30 DIAGNOSIS — I213 ST elevation (STEMI) myocardial infarction of unspecified site: Secondary | ICD-10-CM

## 2015-05-30 DIAGNOSIS — Z823 Family history of stroke: Secondary | ICD-10-CM

## 2015-05-30 DIAGNOSIS — Z8249 Family history of ischemic heart disease and other diseases of the circulatory system: Secondary | ICD-10-CM

## 2015-05-30 DIAGNOSIS — Z09 Encounter for follow-up examination after completed treatment for conditions other than malignant neoplasm: Secondary | ICD-10-CM

## 2015-05-30 DIAGNOSIS — E669 Obesity, unspecified: Secondary | ICD-10-CM | POA: Diagnosis present

## 2015-05-30 DIAGNOSIS — F17210 Nicotine dependence, cigarettes, uncomplicated: Secondary | ICD-10-CM

## 2015-05-30 DIAGNOSIS — T783XXA Angioneurotic edema, initial encounter: Principal | ICD-10-CM | POA: Diagnosis present

## 2015-05-30 DIAGNOSIS — T464X5A Adverse effect of angiotensin-converting-enzyme inhibitors, initial encounter: Secondary | ICD-10-CM | POA: Insufficient documentation

## 2015-05-30 DIAGNOSIS — F17290 Nicotine dependence, other tobacco product, uncomplicated: Secondary | ICD-10-CM | POA: Diagnosis present

## 2015-05-30 DIAGNOSIS — J449 Chronic obstructive pulmonary disease, unspecified: Secondary | ICD-10-CM | POA: Diagnosis present

## 2015-05-30 DIAGNOSIS — J9601 Acute respiratory failure with hypoxia: Secondary | ICD-10-CM | POA: Diagnosis present

## 2015-05-30 DIAGNOSIS — F17200 Nicotine dependence, unspecified, uncomplicated: Secondary | ICD-10-CM

## 2015-05-30 DIAGNOSIS — J9690 Respiratory failure, unspecified, unspecified whether with hypoxia or hypercapnia: Secondary | ICD-10-CM

## 2015-05-30 DIAGNOSIS — I2510 Atherosclerotic heart disease of native coronary artery without angina pectoris: Secondary | ICD-10-CM

## 2015-05-30 DIAGNOSIS — I110 Hypertensive heart disease with heart failure: Secondary | ICD-10-CM | POA: Diagnosis present

## 2015-05-30 DIAGNOSIS — I252 Old myocardial infarction: Secondary | ICD-10-CM

## 2015-05-30 DIAGNOSIS — I10 Essential (primary) hypertension: Secondary | ICD-10-CM | POA: Diagnosis present

## 2015-05-30 HISTORY — DX: Acute myocardial infarction, unspecified: I21.9

## 2015-05-30 LAB — CBC
HEMATOCRIT: 42 % (ref 39.0–52.0)
HEMOGLOBIN: 14 g/dL (ref 13.0–17.0)
MCH: 29.6 pg (ref 26.0–34.0)
MCHC: 33.3 g/dL (ref 30.0–36.0)
MCV: 88.8 fL (ref 78.0–100.0)
Platelets: 236 10*3/uL (ref 150–400)
RBC: 4.73 MIL/uL (ref 4.22–5.81)
RDW: 13 % (ref 11.5–15.5)
WBC: 10.2 10*3/uL (ref 4.0–10.5)

## 2015-05-30 LAB — BLOOD GAS, ARTERIAL
Acid-Base Excess: 2.1 mmol/L — ABNORMAL HIGH (ref 0.0–2.0)
BICARBONATE: 22.4 meq/L (ref 20.0–24.0)
Drawn by: 221791
FIO2: 50
LHR: 14 {breaths}/min
O2 Saturation: 94.5 %
PATIENT TEMPERATURE: 37
PCO2 ART: 42.3 mmHg (ref 35.0–45.0)
PEEP: 5 cmH2O
TCO2: 16.4 mmol/L (ref 0–100)
VT: 550 mL
pH, Arterial: 7.348 — ABNORMAL LOW (ref 7.350–7.450)
pO2, Arterial: 79.7 mmHg — ABNORMAL LOW (ref 80.0–100.0)

## 2015-05-30 LAB — URINALYSIS, ROUTINE W REFLEX MICROSCOPIC
Bilirubin Urine: NEGATIVE
Glucose, UA: NEGATIVE mg/dL
KETONES UR: NEGATIVE mg/dL
NITRITE: NEGATIVE
PH: 5.5 (ref 5.0–8.0)
Protein, ur: NEGATIVE mg/dL
Specific Gravity, Urine: >1.03 — ABNORMAL HIGH (ref 1.005–1.030)
UROBILINOGEN UA: 0.2 mg/dL (ref 0.0–1.0)

## 2015-05-30 LAB — HEPATIC FUNCTION PANEL
ALBUMIN: 3.7 g/dL (ref 3.5–5.0)
ALT: 28 U/L (ref 17–63)
AST: 24 U/L (ref 15–41)
Alkaline Phosphatase: 74 U/L (ref 38–126)
Bilirubin, Direct: <0.1 mg/dL — ABNORMAL LOW (ref 0.1–0.5)
TOTAL PROTEIN: 7 g/dL (ref 6.5–8.1)
Total Bilirubin: 0.7 mg/dL (ref 0.3–1.2)

## 2015-05-30 LAB — BASIC METABOLIC PANEL
Anion gap: 7 (ref 5–15)
BUN: 14 mg/dL (ref 6–20)
CALCIUM: 8.3 mg/dL — AB (ref 8.9–10.3)
CHLORIDE: 105 mmol/L (ref 101–111)
CO2: 25 mmol/L (ref 22–32)
Creatinine, Ser: 0.81 mg/dL (ref 0.61–1.24)
GFR calc non Af Amer: >60 mL/min (ref >60)
Glucose, Bld: 114 mg/dL — ABNORMAL HIGH (ref 65–99)
POTASSIUM: 3.8 mmol/L (ref 3.5–5.1)
Sodium: 137 mmol/L (ref 135–145)

## 2015-05-30 LAB — URINE MICROSCOPIC-ADD ON

## 2015-05-30 LAB — MRSA PCR SCREENING: MRSA by PCR: NEGATIVE

## 2015-05-30 MED ORDER — SODIUM CHLORIDE 0.9 % IJ SOLN
3.0000 mL | Freq: Two times a day (BID) | INTRAMUSCULAR | Status: DC
  Administered 2015-05-30 – 2015-06-03 (×6): 3 mL via INTRAVENOUS

## 2015-05-30 MED ORDER — FENTANYL CITRATE (PF) 100 MCG/2ML IJ SOLN
50.0000 ug | Freq: Once | INTRAMUSCULAR | Status: AC
  Administered 2015-05-30: 50 ug via INTRAVENOUS

## 2015-05-30 MED ORDER — GLYCOPYRROLATE 0.2 MG/ML IJ SOLN: 0.1000 mg | Freq: Once | INTRAMUSCULAR | Status: DC

## 2015-05-30 MED ORDER — BENZOCAINE 20 % MT SOLN
OROMUCOSAL | Status: AC
  Filled 2015-05-30: qty 57

## 2015-05-30 MED ORDER — LIDOCAINE HCL (PF) 2 % IJ SOLN
INTRAMUSCULAR | Status: AC
  Filled 2015-05-30: qty 10

## 2015-05-30 MED ORDER — ONDANSETRON HCL 4 MG PO TABS: 4.0000 mg | ORAL_TABLET | Freq: Four times a day (QID) | ORAL | Status: DC | PRN

## 2015-05-30 MED ORDER — SODIUM CHLORIDE 0.9 % IV BOLUS (SEPSIS)
1000.0000 mL | Freq: Once | INTRAVENOUS | Status: AC
  Administered 2015-05-30: 1000 mL via INTRAVENOUS

## 2015-05-30 MED ORDER — FENTANYL CITRATE (PF) 100 MCG/2ML IJ SOLN
50.0000 ug | INTRAMUSCULAR | Status: AC | PRN
  Administered 2015-05-30 – 2015-05-31 (×3): 50 ug via INTRAVENOUS
  Filled 2015-05-30 (×3): qty 2

## 2015-05-30 MED ORDER — ANTISEPTIC ORAL RINSE SOLUTION (CORINZ)
7.0000 mL | Freq: Four times a day (QID) | OROMUCOSAL | Status: DC
  Administered 2015-05-31 – 2015-06-02 (×12): 7 mL via OROMUCOSAL

## 2015-05-30 MED ORDER — FENTANYL CITRATE (PF) 100 MCG/2ML IJ SOLN
50.0000 ug | INTRAMUSCULAR | Status: DC | PRN
  Administered 2015-06-01 – 2015-06-02 (×4): 50 ug via INTRAVENOUS
  Filled 2015-05-30 (×4): qty 2

## 2015-05-30 MED ORDER — ETOMIDATE 2 MG/ML IV SOLN
INTRAVENOUS | Status: AC
  Filled 2015-05-30: qty 20

## 2015-05-30 MED ORDER — ENOXAPARIN SODIUM 40 MG/0.4ML ~~LOC~~ SOLN
40.0000 mg | SUBCUTANEOUS | Status: DC
  Administered 2015-05-30 – 2015-06-03 (×5): 40 mg via SUBCUTANEOUS
  Filled 2015-05-30 (×5): qty 0.4

## 2015-05-30 MED ORDER — GLYCOPYRROLATE 0.2 MG/ML IJ SOLN
INTRAMUSCULAR | Status: AC
  Filled 2015-05-30: qty 1

## 2015-05-30 MED ORDER — PROPOFOL 1000 MG/100ML IV EMUL
INTRAVENOUS | Status: AC
  Filled 2015-05-30: qty 100

## 2015-05-30 MED ORDER — ROCURONIUM BROMIDE 50 MG/5ML IV SOLN
INTRAVENOUS | Status: AC
  Filled 2015-05-30: qty 2

## 2015-05-30 MED ORDER — CHLORHEXIDINE GLUCONATE 0.12% ORAL RINSE (MEDLINE KIT)
15.0000 mL | Freq: Two times a day (BID) | OROMUCOSAL | Status: DC
  Administered 2015-05-30 – 2015-06-03 (×9): 15 mL via OROMUCOSAL

## 2015-05-30 MED ORDER — LIDOCAINE HCL (CARDIAC) 20 MG/ML IV SOLN
INTRAVENOUS | Status: AC
Start: 2015-05-30 — End: 2015-05-31
  Filled 2015-05-30: qty 5

## 2015-05-30 MED ORDER — EPINEPHRINE 0.3 MG/0.3ML IJ SOAJ
0.3000 mg | Freq: Once | INTRAMUSCULAR | Status: AC
  Administered 2015-05-30: 0.3 mg via INTRAMUSCULAR
  Filled 2015-05-30: qty 0.3

## 2015-05-30 MED ORDER — SODIUM CHLORIDE 0.9 % IV SOLN
INTRAVENOUS | Status: DC
  Administered 2015-05-30 – 2015-06-01 (×4): via INTRAVENOUS

## 2015-05-30 MED ORDER — KETAMINE HCL 50 MG/ML IJ SOLN
INTRAMUSCULAR | Status: AC
  Administered 2015-05-30: 130 mg
  Filled 2015-05-30: qty 10

## 2015-05-30 MED ORDER — FENTANYL CITRATE (PF) 100 MCG/2ML IJ SOLN
INTRAMUSCULAR | Status: AC
  Filled 2015-05-30: qty 4

## 2015-05-30 MED ORDER — SUCCINYLCHOLINE CHLORIDE 20 MG/ML IJ SOLN
INTRAMUSCULAR | Status: AC
  Administered 2015-05-30: 130 mg
  Filled 2015-05-30: qty 1

## 2015-05-30 MED ORDER — ONDANSETRON HCL 4 MG/2ML IJ SOLN: 4.0000 mg | Freq: Four times a day (QID) | INTRAMUSCULAR | Status: DC | PRN

## 2015-05-30 MED ORDER — KETAMINE HCL 10 MG/ML IJ SOLN
1.5000 mg/kg | Freq: Once | INTRAMUSCULAR | Status: AC
  Administered 2015-05-30: 143 mg via INTRAVENOUS

## 2015-05-30 MED ORDER — PROPOFOL 1000 MG/100ML IV EMUL
5.0000 ug/kg/min | INTRAVENOUS | Status: DC
  Administered 2015-05-30: 20 ug/kg/min via INTRAVENOUS
  Administered 2015-05-30: 40 ug/kg/min via INTRAVENOUS
  Administered 2015-05-31 (×3): 60 ug/kg/min via INTRAVENOUS
  Administered 2015-05-31: 45 ug/kg/min via INTRAVENOUS
  Administered 2015-05-31: 40 ug/kg/min via INTRAVENOUS
  Administered 2015-05-31: 60 ug/kg/min via INTRAVENOUS
  Administered 2015-06-01 (×6): 70 ug/kg/min via INTRAVENOUS
  Administered 2015-06-01: 60 ug/kg/min via INTRAVENOUS
  Administered 2015-06-01: 70 ug/kg/min via INTRAVENOUS
  Administered 2015-06-01: 60 ug/kg/min via INTRAVENOUS
  Administered 2015-06-02 (×5): 70 ug/kg/min via INTRAVENOUS
  Filled 2015-05-30 (×6): qty 100
  Filled 2015-05-30: qty 200
  Filled 2015-05-30 (×4): qty 100
  Filled 2015-05-30: qty 200
  Filled 2015-05-30: qty 100
  Filled 2015-05-30: qty 300
  Filled 2015-05-30 (×3): qty 100
  Filled 2015-05-30: qty 200

## 2015-05-30 MED ORDER — HYDRALAZINE HCL 20 MG/ML IJ SOLN: 5.0000 mg | INTRAMUSCULAR | Status: DC | PRN

## 2015-05-30 MED ORDER — METHYLPREDNISOLONE SODIUM SUCC 125 MG IJ SOLR
125.0000 mg | Freq: Four times a day (QID) | INTRAMUSCULAR | Status: DC
  Administered 2015-05-30 – 2015-06-01 (×7): 125 mg via INTRAVENOUS
  Filled 2015-05-30 (×6): qty 2

## 2015-05-30 MED ORDER — LIDOCAINE VISCOUS 2 % MT SOLN
OROMUCOSAL | Status: AC
  Filled 2015-05-30: qty 15

## 2015-05-30 MED ORDER — MIDAZOLAM HCL 5 MG/5ML IJ SOLN
INTRAMUSCULAR | Status: AC
  Filled 2015-05-30: qty 5

## 2015-05-30 MED ORDER — PANTOPRAZOLE SODIUM 40 MG IV SOLR
40.0000 mg | INTRAVENOUS | Status: DC
  Administered 2015-05-30: 40 mg via INTRAVENOUS
  Filled 2015-05-30: qty 40

## 2015-05-30 MED ORDER — GLYCOPYRROLATE 0.2 MG/ML IJ SOLN
0.2000 mg | Freq: Once | INTRAMUSCULAR | Status: AC
  Administered 2015-05-30: 0.2 mg via INTRAVENOUS

## 2015-05-30 MED ORDER — MIDAZOLAM HCL 5 MG/5ML IJ SOLN
2.0000 mg | Freq: Once | INTRAMUSCULAR | Status: AC
Start: 2015-05-30 — End: 2015-05-30
  Administered 2015-05-30: 2 mg via INTRAVENOUS

## 2015-05-30 MED ORDER — DEXAMETHASONE SODIUM PHOSPHATE 10 MG/ML IJ SOLN
10.0000 mg | Freq: Once | INTRAMUSCULAR | Status: AC
  Administered 2015-05-30: 10 mg via INTRAVENOUS
  Filled 2015-05-30: qty 1

## 2015-05-30 NOTE — ED Provider Notes (Signed)
CSN: [id number]     Arrival date & time 05/30/15  1333 History   First MD Initiated Contact with Patient 05/30/15 1424     Chief Complaint  Patient presents with  . Oral Swelling     (Consider location/radiation/quality/duration/timing/severity/associated sxs/prior Treatment) HPI Comments: 49 year-old male with history of high blood pressure, heart attack, smoker presents with worsening throat swelling for the past 3 hours. Patient is on lisinopril unsure if he took it this morning or last night. No history of similar event. Patient feels swelling sensation the back of his throat feels different when he tried to swallow water. No other new exposures. No fevers or chills.  The history is provided by the patient.    Past Medical History  Diagnosis Date  . Neck pain   . Otitis media   . Pleurisy   . CAD (coronary artery disease)     lateral STEMI 02/06/2015 00% D1 occlusion treated with Promus Premier 2.5 mm x 16 mm DES, 70% ramus stenosis, 40% mid RCA stenosis, 45% distal RCA stenosis, EF 45-50%  . Sinus pause     9 sec sinus pause on telemetry after started on coreg after MI, avoid AV nodal blocking agent  . MI (myocardial infarction)    Past Surgical History  Procedure Laterality Date  . Incision / drainage hand / finger    . Cardiac catheterization N/A 02/06/2015    Procedure: Left Heart Cath and Coronary Angiography;  Surgeon: Leonie Man, MD;  Location: [facility];  Service: Cardiovascular;  Laterality: N/A;  . Cardiac catheterization N/A 02/06/2015    Procedure: Coronary Stent Intervention;  Surgeon: Leonie Man, MD;  Location: [facility];  Service: Cardiovascular;  Laterality: N/A;   Family History  Problem Relation Age of Onset  . Heart attack Father   . Stroke Father    Social History  Substance Use Topics  . Smoking status: Current Every Day Smoker -- 1.00 packs/day    Types: Cigars  . Smokeless tobacco: None  . Alcohol Use: 0.0 oz/week    0  Standard drinks or equivalent per week    Review of Systems  Constitutional: Negative for fever and chills.  HENT: Negative for congestion.   Eyes: Negative for visual disturbance.  Respiratory: Positive for shortness of breath.   Cardiovascular: Negative for chest pain.  Gastrointestinal: Negative for vomiting and abdominal pain.  Genitourinary: Negative for dysuria and flank pain.  Musculoskeletal: Negative for back pain, neck pain and neck stiffness.  Skin: Negative for rash.  Neurological: Negative for light-headedness and headaches.      Allergies  Amoxicillin and Carvedilol  Home Medications   Prior to Admission medications   Medication Sig Start Date End Date Taking? Authorizing Provider  aspirin EC 81 MG tablet Take 81 mg by mouth daily.    Historical Provider, MD  Menthol-Methyl Salicylate (MUSCLE RUB) 10-15 % CREA Apply 1 application topically as needed for muscle pain.    Historical Provider, MD  nitroGLYCERIN (NITROSTAT) 0.4 MG SL tablet Place 1 tablet (0.4 mg total) under the tongue every 5 (five) minutes as needed for chest pain. 02/09/15   Almyra Deforest, PA  oxymetazoline (AFRIN) 0.05 % nasal spray Place 1 spray into both nostrils 2 (two) times daily as needed for congestion.    Historical Provider, MD  rosuvastatin (CRESTOR) 10 MG tablet Take 1 tablet (10 mg total) by mouth daily. 02/16/15   Lendon Colonel, NP  ticagrelor ([place]) 90 MG  TABS tablet Take 1 tablet (90 mg total) by mouth 2 (two) times daily. 03/14/15   Lendon Colonel, NP   BP 150/85 mmHg  Pulse 89  Temp(Src) 98.4 F (36.9 C) (Oral)  Resp 14  Ht '5\' 6"'$  (1.676 m)  Wt 210 lb (95.255 kg)  BMI 33.91 kg/m2  SpO2 90% Physical Exam  Constitutional: He is oriented to person, place, and time. He appears well-developed and well-nourished.  HENT:  Head: Normocephalic and atraumatic.  Patient has posterior pharyngeal edema, no stridor, no significant tongue swelling  Eyes: Conjunctivae are normal. Right  eye exhibits no discharge. Left eye exhibits no discharge.  Neck: Normal range of motion. Neck supple. No tracheal deviation present.  Cardiovascular: Normal rate and regular rhythm.   Pulmonary/Chest: Effort normal and breath sounds normal.  Abdominal: Soft. He exhibits no distension. There is no tenderness. There is no guarding.  Musculoskeletal: He exhibits no edema.  Neurological: He is alert and oriented to person, place, and time.  Skin: Skin is warm. No rash noted.  Psychiatric: He has a normal mood and affect.  Nursing note and vitals reviewed.   ED Course  Procedures (including critical care time) Fiberoptic scope Indication throat swelling angioedema Fiberoptic scope was used discussed indication risks and benefits with the patient lidocaine applied to the nares. Visualization of posterior pharynx revealed moderate edema. Performed by myself [employer]  CRITICAL CARE Performed by: Mariea Clonts   Total critical care time: 75 min  Critical care time was exclusive of separately billable procedures and treating other patients.  Critical care was necessary to treat or prevent imminent or life-threatening deterioration.  Critical care was time spent personally by me on the following activities: development of treatment plan with patient and/or surrogate as well as nursing, discussions with consultants, evaluation of patient's response to treatment, examination of patient, obtaining history from patient or surrogate, ordering and performing treatments and interventions, ordering and review of laboratory studies, ordering and review of radiographic studies, pulse oximetry and re-evaluation of patient's condition.  INTUBATION Performed by: Mariea Clonts  Required items: required blood products, implants, devices, and special equipment available Patient identity confirmed: provided demographic data and hospital-assigned identification number Time out: Immediately prior to  procedure a "time out" was called to verify the correct patient, procedure, equipment, support staff.  Indications: angioedema Intubation method: video Preoxygenation: BVM Sedatives: ketamine Paralytic: Succinylcholine Tube Size: 7 cuffed  Post-procedure assessment: chest rise and ETCO2 monitor Breath sounds: equal and absent over the epigastrium Tube secured with: ETT holder Chest x-ray interpreted by me.   Patient tolerated the procedure well with no immediate complications.  Labs Review Labs Reviewed  BASIC METABOLIC PANEL - Abnormal; Notable for the following:    Glucose, Bld 114 (*)    Calcium 8.3 (*)    All other components within normal limits  CBC  URINALYSIS, ROUTINE W REFLEX MICROSCOPIC (NOT AT [facility] [address])    Imaging Review No results found. I have personally reviewed and evaluated these images and lab results as part of my medical decision-making.   EKG Interpretation None      MDM   Final diagnoses:  ACE inhibitor-aggravated angioedema, initial encounter   Patient presents with concern for angioedema from lisinopril. Plan for steroid, epinephrine and close observation. Discussed with patient he may require intubation and transfer to [facility] cone if he worsens.  Patient's symptoms worsened and exam findings showed worsening posterior edema. Patient having difficulty with secretions. Decision to made to emergently intubate  him. Initially attempt of awake with ketamine and analgesia however difficulty opening patient's mouth sufficiently. Paralytic given. Patient intubated successfully with assistance from staff and paralytic.  Anesthesiology at bedside assisting and for backup.  Plan for intensive care admission. Patient signed out to ED provider pending final admission to ICU.  The patients results and plan were reviewed and discussed.   Any x-rays performed were independently reviewed by myself.   Differential diagnosis were considered with the presenting  HPI.  Medications  lidocaine (XYLOCAINE) 2 % viscous mouth solution (not administered)  fentaNYL (SUBLIMAZE) 100 MCG/2ML injection (not administered)  lidocaine (cardiac) 100 mg/31m (XYLOCAINE) 20 MG/ML injection 2% (not administered)  rocuronium (ZEMURON) 50 MG/5ML injection (not administered)  etomidate (AMIDATE) 2 MG/ML injection (not administered)  benzocaine (HURRICAINE) 20 % mouth spray (not administered)  glycopyrrolate (ROBINUL) 0.2 MG/ML injection (not administered)  glycopyrrolate (ROBINUL) 0.2 MG/ML injection (not administered)  midazolam (VERSED) 5 MG/5ML injection (not administered)  propofol (DIPRIVAN) 1000 MG/100ML infusion (not administered)  propofol (DIPRIVAN) 1000 MG/100ML infusion (not administered)  sodium chloride 0.9 % bolus 1,000 mL (1,000 mLs Intravenous New Bag/Given 05/30/15 1430)  EPINEPHrine (EPI-PEN) injection 0.3 mg (0.3 mg Intramuscular Given 05/30/15 1432)  dexamethasone (DECADRON) injection 10 mg (10 mg Intravenous Given 05/30/15 1430)  ketamine (KETALAR) 50 MG/ML injection (130 mg  Given by Other 05/30/15 1515)  succinylcholine (ANECTINE) 20 MG/ML injection (130 mg  Given 05/30/15 1523)  fentaNYL (SUBLIMAZE) injection 50 mcg (50 mcg Intravenous Given 05/30/15 1511)  ketamine (KETALAR) injection 143 mg (143 mg Intravenous Given 05/30/15 1521)  glycopyrrolate (ROBINUL) injection 0.2 mg (0.2 mg Intravenous Given by Other 05/30/15 1519)  midazolam (VERSED) 5 MG/5ML injection 2 mg (2 mg Intravenous Given 05/30/15 1527)    Filed Vitals:   05/30/15 1357 05/30/15 1445 05/30/15 1500 05/30/15 1515  BP: 154/84  153/84 150/85  Pulse: 92 84 85 89  Temp: 98.4 F (36.9 C)     TempSrc: Oral     Resp: '18 20 17 14  '$ Height: '5\' 6"'$  (1.676 m)     Weight: 210 lb (95.255 kg)     SpO2: 97% 95% 94% 90%    Final diagnoses:  ACE inhibitor-aggravated angioedema, initial encounter     JElnora Morrison MD 05/30/15 1538

## 2015-05-30 NOTE — ED Notes (Signed)
Patient intubated with 7 mm ETT at 1526

## 2015-05-30 NOTE — ED Notes (Signed)
Patient awake, fighting tube. Patient difficult to sedate. Patient given 3 20 mg bolus of propofol over 5 minutes. Patient resting now.

## 2015-05-30 NOTE — ED Notes (Signed)
Was at work and felt as if throat scratchy- took some water - throat has became swollen - State that he has difficulty swallowing - feels like he has something a back of his throat

## 2015-05-30 NOTE — ED Provider Notes (Signed)
Assisted Dr. Reather Converse with intubation for angioedema.  CXR shows ETT in appropriate position.  Dr. Anastasio Champion agrees that patient can stay at [facility] ICU.  CRITICAL CARE Performed by: Ezequiel Essex Total critical care time: 30 Critical care time was exclusive of separately billable procedures and treating other patients. Critical care was necessary to treat or prevent imminent or life-threatening deterioration. Critical care was time spent personally by me on the following activities: development of treatment plan with patient and/or surrogate as well as nursing, discussions with consultants, evaluation of patient's response to treatment, examination of patient, obtaining history from patient or surrogate, ordering and performing treatments and interventions, ordering and review of laboratory studies, ordering and review of radiographic studies, pulse oximetry and re-evaluation of patient's condition.   BP 99/67 mmHg  Pulse 95  Temp(Src) 98.4 F (36.9 C) (Oral)  Resp 22  Ht '5\' 6"'$  (1.676 m)  Wt 210 lb (95.255 kg)  BMI 33.91 kg/m2  SpO2 100%   Ezequiel Essex, MD 05/30/15 1705

## 2015-05-30 NOTE — ED Notes (Addendum)
Patient fighting tube. BP increased to 913 systolic. Propofol increased to 40 mcg/kg/min.

## 2015-05-30 NOTE — ED Notes (Signed)
Patient BP decreased. Propofol decreased to 30 mcg/min

## 2015-05-30 NOTE — H&P (Signed)
[facility] History and Physical  Randy Hebert DQQ:[id number] DOB: 09/01/1966 DOA: 05/30/2015  Referring physician: ER PCP: No PCP Per Patient   Chief Complaint: Oral swelling, respirator distress  HPI: Randy Hebert is a 49 y.o. male  This is a 49 year old man who is currently intubated and mechanically ventilated. Therefore, he cannot give me any clear history. History was obtained from the medical chart, and his girlfriend at the bedside. Approximately 1 PM today, he had feeling of choking in his throat and difficulty with breathing and unable to swallow water. He taken his usual medications, including lisinopril today. He continued to have further swelling in his throat and when he came to the emergency room, he was clearly in respiratory distress. He was intubated and mechanically ventilated now. The new medication started approximately a month ago has been lisinopril, known to cause angioedema and severe reaction sometimes. He is therefore now being admitted for further management.   Review of Systems:  Unable to obtain a review of systems currently because of the patient's condition.  Past Medical History  Diagnosis Date  . Neck pain   . Otitis media   . Pleurisy   . CAD (coronary artery disease)     lateral STEMI 02/06/2015 00% D1 occlusion treated with Promus Premier 2.5 mm x 16 mm DES, 70% ramus stenosis, 40% mid RCA stenosis, 45% distal RCA stenosis, EF 45-50%  . Sinus pause     9 sec sinus pause on telemetry after started on coreg after MI, avoid AV nodal blocking agent  . MI (myocardial infarction)    Past Surgical History  Procedure Laterality Date  . Incision / drainage hand / finger    . Cardiac catheterization N/A 02/06/2015    Procedure: Left Heart Cath and Coronary Angiography;  Surgeon: Leonie Man, MD;  Location: [facility];  Service: Cardiovascular;  Laterality: N/A;  . Cardiac catheterization N/A 02/06/2015    Procedure: Coronary Stent  Intervention;  Surgeon: Leonie Man, MD;  Location: [place] [facility];  Service: Cardiovascular;  Laterality: N/A;   Social History:  reports that he has been smoking Cigars.  He does not have any smokeless tobacco history on file. He reports that he drinks alcohol. He reports that he does not use illicit drugs.  Allergies  Allergen Reactions  . Lisinopril Shortness Of Breath    Angioedema, required intubation and mechanical ventilation  . Amoxicillin Nausea And Vomiting  . Carvedilol Other (See Comments)    Sinus pause on telemetry >3 seconds. Longest one 9 sec. No AV nodal agent    Family History  Problem Relation Age of Onset  . Heart attack Father   . Stroke Father     Prior to Admission medications   Medication Sig Start Date End Date Taking? Authorizing Provider  aspirin EC 81 MG tablet Take 81 mg by mouth daily.    Historical Provider, MD  atorvastatin (LIPITOR) 80 MG tablet TAKE 1 TABLET (80 MG TOTAL) BY MOUTH DAILY AT 6 PM. 03/14/15   Historical Provider, MD  CRESTOR 20 MG tablet TAKE 1 Tablet BY MOUTH EVERY NIGHT AT BEDTIME FOR CHOLESTEROL 03/15/15   Historical Provider, MD  HYDROcodone-acetaminophen (NORCO/VICODIN) 5-325 MG tablet Take by mouth. for pain 03/12/15   Historical Provider, MD  lisinopril (PRINIVIL,ZESTRIL) 5 MG tablet Take 5 mg by mouth daily. 03/27/15   Historical Provider, MD  Menthol-Methyl Salicylate (MUSCLE RUB) 10-15 % CREA Apply 1 application topically as needed for muscle pain.  Historical Provider, MD  nitroGLYCERIN (NITROSTAT) 0.4 MG SL tablet Place 1 tablet (0.4 mg total) under the tongue every 5 (five) minutes as needed for chest pain. 02/09/15   Almyra Deforest, PA  oxymetazoline (AFRIN) 0.05 % nasal spray Place 1 spray into both nostrils 2 (two) times daily as needed for congestion.    Historical Provider, MD  rosuvastatin (CRESTOR) 10 MG tablet Take 1 tablet (10 mg total) by mouth daily. 02/16/15   Lendon Colonel, NP  ticagrelor (BRILINTA) 90 MG TABS  tablet Take 1 tablet (90 mg total) by mouth 2 (two) times daily. 03/14/15   Lendon Colonel, NP   Physical Exam: Filed Vitals:   05/30/15 1500 05/30/15 1515 05/30/15 1540 05/30/15 1610  BP: 153/84 150/85 129/75 81/50  Pulse: 85 89 105 88  Temp:      TempSrc:      Resp: '17 14 22 15  '$ Height:      Weight:      SpO2: 94% 90% 98% 95%    Wt Readings from Last 3 Encounters:  05/30/15 95.255 kg (210 lb)  05/17/15 97.705 kg (215 lb 6.4 oz)  02/16/15 90.266 kg (199 lb)    General:  Appears sedated, intubated and mechanically ventilated. Peripheries are warm. He is hemodynamically stable. Eyes: PERRL, normal lids, irises & conjunctiva ENT: grossly normal hearing, lips & tongue Neck: no LAD, masses or thyromegaly Cardiovascular: RRR, no m/r/g. No LE edema. Telemetry: SR, no arrhythmias  Respiratory: CTA bilaterally, no w/r/r. Normal respiratory effort. Abdomen: soft, ntnd Skin: no rash or induration seen on limited exam Musculoskeletal: grossly normal tone BUE/BLE Psychiatric: Not examined. Neurologic: Sedated.           Labs on Admission:  Basic Metabolic Panel:  Recent Labs Lab 05/30/15 1443  NA 137  K 3.8  CL 105  CO2 25  GLUCOSE 114*  BUN 14  CREATININE 0.81  CALCIUM 8.3*   Liver Function Tests: No results for input(s): AST, ALT, ALKPHOS, BILITOT, PROT, ALBUMIN in the last 168 hours. No results for input(s): LIPASE, AMYLASE in the last 168 hours. No results for input(s): AMMONIA in the last 168 hours. CBC:  Recent Labs Lab 05/30/15 1443  WBC 10.2  HGB 14.0  HCT 42.0  MCV 88.8  PLT 236   Cardiac Enzymes: No results for input(s): CKTOTAL, CKMB, CKMBINDEX, TROPONINI in the last 168 hours.  BNP (last 3 results) No results for input(s): BNP in the last 8760 hours.  ProBNP (last 3 results) No results for input(s): PROBNP in the last 8760 hours.  CBG: No results for input(s): GLUCAP in the last 168 hours.  Radiological Exams on Admission: Dg Chest  Port 1 View  05/30/2015   CLINICAL DATA:  Intubation.  EXAM: PORTABLE CHEST 1 VIEW  COMPARISON:  02/06/2015  FINDINGS: The endotracheal tube is 2.6 cm above the carina. The heart is enlarged. The mediastinal contours are prominent but this is due to the supine position of the patient. There is vascular congestion and streaky atelectasis. No pleural effusions or focal infiltrates.  IMPRESSION: The endotracheal tube is 2.6 cm above the carina.  Cardiac enlargement with vascular congestion and streaky atelectasis. No definite infiltrates or effusions.   Electronically Signed   By: Marijo Sanes M.D.   On: 05/30/2015 15:48      Assessment/Plan   1. Severe angioedema/allergic reaction. The culprit likely is lisinopril and I have listed this as a severe allergy in the medical chart. He'll be treated with  IV steroids and lisinopril will be withheld. 2. Acute respiratory failure/upper airway compromise. He is intubated and mechanically ventilated. I will request pulmonary consultation to assist with ventilator management. 3. Hypertension. Use intravenous hydralazine when necessary for elevated blood pressure.  He'll be admitted to intensive care unit. Further recommendations will depend on patient's hospital progress.  Code Status: Full code.  DVT Prophylaxis: Lovenox.  Family Communication: I discussed the plan with the patient's girlfriend at the bedside.  Disposition Plan: Home when medically stable.   Time spent: 60 minutes.  Doree Albee [facility] Pager [phone]

## 2015-05-30 NOTE — ED Notes (Signed)
Ketamine administered at this time.

## 2015-05-30 NOTE — Progress Notes (Signed)
[facility] Progress Note Patient Name: Randy Hebert DOB: 23-May-1966 MRN: [id number]   Date of Service  05/30/2015  HPI/Events of Note  Ventilated patient - needs stress ulcer prophylaxis.   eICU Interventions  Will order Protonix 40 mg IV now and Q day.     Intervention Category Intermediate Interventions: Best-practice therapies (e.g. DVT, beta blocker, etc.) Minor Interventions: Agitation / anxiety - evaluation and management  Sommer,Steven Eugene 05/30/2015, 5:21 PM

## 2015-05-30 NOTE — Progress Notes (Signed)
[facility] Progress Note Patient Name: Randy Hebert DOB: [birth year] MRN: [id number]   Date of Service  05/30/2015  HPI/Events of Note  Patient on mechanical ventilation and sedated with a Propofol IV infusion.  eICU Interventions  Will add Fentanyl 50 mcg IV Q 2 hours PRN pain or RASS = 0 to -1.      Intervention Category Intermediate Interventions: Pain - evaluation and management Minor Interventions: Agitation / anxiety - evaluation and management  Sommer,Steven Eugene 05/30/2015, 5:18 PM

## 2015-05-31 ENCOUNTER — Inpatient Hospital Stay (HOSPITAL_COMMUNITY): Payer: Self-pay

## 2015-05-31 DIAGNOSIS — Z720 Tobacco use: Secondary | ICD-10-CM

## 2015-05-31 DIAGNOSIS — T783XXA Angioneurotic edema, initial encounter: Principal | ICD-10-CM

## 2015-05-31 DIAGNOSIS — I213 ST elevation (STEMI) myocardial infarction of unspecified site: Secondary | ICD-10-CM

## 2015-05-31 DIAGNOSIS — F17200 Nicotine dependence, unspecified, uncomplicated: Secondary | ICD-10-CM

## 2015-05-31 DIAGNOSIS — I2510 Atherosclerotic heart disease of native coronary artery without angina pectoris: Secondary | ICD-10-CM

## 2015-05-31 DIAGNOSIS — F17210 Nicotine dependence, cigarettes, uncomplicated: Secondary | ICD-10-CM

## 2015-05-31 HISTORY — DX: ST elevation (STEMI) myocardial infarction of unspecified site: I21.3

## 2015-05-31 LAB — CBC
HEMATOCRIT: 40.4 % (ref 39.0–52.0)
Hemoglobin: 13.4 g/dL (ref 13.0–17.0)
MCH: 29.6 pg (ref 26.0–34.0)
MCHC: 33.2 g/dL (ref 30.0–36.0)
MCV: 89.2 fL (ref 78.0–100.0)
PLATELETS: 232 10*3/uL (ref 150–400)
RBC: 4.53 MIL/uL (ref 4.22–5.81)
RDW: 12.9 % (ref 11.5–15.5)
WBC: 10.4 10*3/uL (ref 4.0–10.5)

## 2015-05-31 LAB — BLOOD GAS, ARTERIAL
Acid-Base Excess: 0.8 mmol/L (ref 0.0–2.0)
Bicarbonate: 23.6 mEq/L (ref 20.0–24.0)
Drawn by: 317771
FIO2: 0.45
MECHVT: 550 mL
O2 Saturation: 88.2 %
PEEP: 5 cmH2O
RATE: 14 resp/min
pCO2 arterial: 39.2 mmHg (ref 35.0–45.0)
pH, Arterial: 7.395 (ref 7.350–7.450)
pO2, Arterial: 56.7 mmHg — ABNORMAL LOW (ref 80.0–100.0)

## 2015-05-31 LAB — COMPREHENSIVE METABOLIC PANEL
ALBUMIN: 3.6 g/dL (ref 3.5–5.0)
ALT: 30 U/L (ref 17–63)
AST: 25 U/L (ref 15–41)
Alkaline Phosphatase: 78 U/L (ref 38–126)
Anion gap: 10 (ref 5–15)
BUN: 20 mg/dL (ref 6–20)
CHLORIDE: 105 mmol/L (ref 101–111)
CO2: 24 mmol/L (ref 22–32)
CREATININE: 0.88 mg/dL (ref 0.61–1.24)
Calcium: 8.7 mg/dL — ABNORMAL LOW (ref 8.9–10.3)
GFR calc Af Amer: >60 mL/min (ref >60)
GLUCOSE: 192 mg/dL — AB (ref 65–99)
Potassium: 4.2 mmol/L (ref 3.5–5.1)
Sodium: 139 mmol/L (ref 135–145)
Total Bilirubin: 0.6 mg/dL (ref 0.3–1.2)
Total Protein: 6.8 g/dL (ref 6.5–8.1)

## 2015-05-31 MED ORDER — PROPOFOL 1000 MG/100ML IV EMUL
INTRAVENOUS | Status: AC
  Administered 2015-05-31: 1000 mg
  Filled 2015-05-31: qty 100

## 2015-05-31 MED ORDER — MIDAZOLAM HCL 2 MG/2ML IJ SOLN
1.0000 mg | INTRAMUSCULAR | Status: DC | PRN
  Administered 2015-05-31 – 2015-06-02 (×7): 1 mg via INTRAVENOUS
  Filled 2015-05-31 (×7): qty 2

## 2015-05-31 MED ORDER — TICAGRELOR 90 MG PO TABS
90.0000 mg | ORAL_TABLET | Freq: Two times a day (BID) | ORAL | Status: DC
  Administered 2015-05-31 – 2015-06-02 (×6): 90 mg
  Filled 2015-05-31 (×9): qty 1

## 2015-05-31 MED ORDER — FAMOTIDINE IN NACL 20-0.9 MG/50ML-% IV SOLN
20.0000 mg | Freq: Two times a day (BID) | INTRAVENOUS | Status: DC
  Administered 2015-05-31 – 2015-06-02 (×6): 20 mg via INTRAVENOUS
  Filled 2015-05-31 (×9): qty 50

## 2015-05-31 MED ORDER — ASPIRIN 81 MG PO CHEW
81.0000 mg | CHEWABLE_TABLET | Freq: Every day | ORAL | Status: DC
  Administered 2015-05-31 – 2015-06-02 (×3): 81 mg
  Filled 2015-05-31 (×4): qty 1

## 2015-05-31 MED ORDER — THIAMINE HCL 100 MG/ML IJ SOLN
Freq: Once | INTRAVENOUS | Status: AC
  Administered 2015-05-31: 09:00:00 via INTRAVENOUS
  Filled 2015-05-31: qty 1000

## 2015-05-31 MED ORDER — ROSUVASTATIN CALCIUM 20 MG PO TABS
20.0000 mg | ORAL_TABLET | Freq: Every day | ORAL | Status: DC
  Administered 2015-05-31 – 2015-06-01 (×2): 20 mg
  Filled 2015-05-31 (×2): qty 1

## 2015-05-31 MED ORDER — IPRATROPIUM-ALBUTEROL 0.5-2.5 (3) MG/3ML IN SOLN
3.0000 mL | RESPIRATORY_TRACT | Status: DC
  Administered 2015-05-31 – 2015-06-03 (×19): 3 mL via RESPIRATORY_TRACT
  Filled 2015-05-31 (×19): qty 3

## 2015-05-31 MED ORDER — NICOTINE 21 MG/24HR TD PT24
21.0000 mg | MEDICATED_PATCH | Freq: Every day | TRANSDERMAL | Status: DC
  Administered 2015-05-31 – 2015-06-04 (×5): 21 mg via TRANSDERMAL
  Filled 2015-05-31 (×5): qty 1

## 2015-05-31 MED ORDER — DIPHENHYDRAMINE HCL 12.5 MG/5ML PO ELIX
25.0000 mg | ORAL_SOLUTION | Freq: Four times a day (QID) | ORAL | Status: DC
  Administered 2015-05-31 – 2015-06-03 (×11): 25 mg
  Filled 2015-05-31 (×12): qty 10

## 2015-05-31 NOTE — Progress Notes (Signed)
PROGRESS NOTE  Randy SHAMOON CWU:[id number] DOB: 02-Mar-1966 DOA: 05/30/2015 PCP: No PCP Per Patient  Summary: 49 y/o male with a hx of HTN presented to the emergency department with complaints of difficulty breathing, difficulty swallowing water, and feeling of choking. Per patient's girlfriend, he was recently started on Lisinopril 1 month ago. While in the ED, the patient was intubated secondary to being in respiratory distress. Labs and CXR were unremarkable. He was admitted for further management.   Assessment/Plan: 1. Severe angioedema of pharynx resulting in airway compromise, presumed secondary to lisinopril. Emergently intubated 9/28. Continue to hold medication and give steroids.   2. Hypertension, stable. Continue antihypertensives. 3. CAD, s/p STEMI 01/2015, sinus pause secondary to beta-blocker. Continue dual antiplatelet therapy minimum 1 year. Continue rosuvastatin. 4. Tobacco use disorder   Remained intubated until swelling has decreased then extubate   Continue steroids and H1 and H2 blockers.  Continue ASA and ticagrelor per tube  Code Status: Full DVT prophylaxis: Lovenox Family Communication: Girlfriend bedside Disposition Plan: Home when improved  Murray Hodgkins, MD  [facility]  Pager [phone] If 7PM-7AM, please contact night-coverage at [email or web address], password [facility] 05/31/2015, 6:11 AM  LOS: 1 day   Consultants:    Procedures:  ETT 9/28  Antibiotics:    HPI/Subjective: Intubated and sedated.  Objective: Filed Vitals:   05/31/15 0445 05/31/15 0500 05/31/15 0515 05/31/15 0530  BP: 130/77 116/64 119/67 125/70  Pulse: 73 70 70 73  Temp:      TempSrc:      Resp: '20 16 17 22  '$ Height:      Weight:      SpO2: 93% 91% 91% 92%    Intake/Output Summary (Last 24 hours) at 05/31/15 0349 Last data filed at 05/30/15 2200  Gross per 24 hour  Intake 510.87 ml  Output    550 ml  Net -39.13 ml     Filed Weights   05/30/15 1357  Weight:  95.255 kg (210 lb)    Exam: VSS, Not hypoxic, afebrile General: intubated, sedated Eyes: Limited exam but normal ENT: Limited exam but normal Neck: no LAD, masses or thyromegaly Cardiovascular: RRR, no m/r/g. No LE edema. Telemetry: SR, no arrhythmias  Respiratory: CTA bilaterally, no w/r/r. Normal respiration on vent Abdomen: soft, ntnd Musculoskeletal: grossly normal tone BUE/BLE Psychiatric: not assessable  Neurologic: inot assessable   New data reviewed:  PH 7.395, ABG reassuring  BMP/ LFTS unremarkable  CBC unremarkable  Pertinent data since admission:  CXR IMPRESSION: The endotracheal tube is 2.6 cm above the carina. Cardiac enlargement with vascular congestion and streaky atelectasis. No definite infiltrates or effusions.  Pending data:    Scheduled Meds: . antiseptic oral rinse  7 mL Mouth Rinse QID  . chlorhexidine gluconate  15 mL Mouth Rinse BID  . enoxaparin (LOVENOX) injection  40 mg Subcutaneous Q24H  . methylPREDNISolone (SOLU-MEDROL) injection  125 mg Intravenous Q6H  . pantoprazole (PROTONIX) IV  40 mg Intravenous Q24H  . sodium chloride  3 mL Intravenous Q12H   Continuous Infusions: . sodium chloride 75 mL/hr at 05/31/15 0500  . propofol (DIPRIVAN) infusion 45 mcg/kg/min (05/31/15 0500)    Principal Problem:   Angioedema Active Problems:   Essential hypertension   CAD (coronary artery disease)   STEMI (ST elevation myocardial infarction)   Tobacco use disorder   Time spent 25 minutes   By signing my name below, I, Rosalie Doctor attest that this documentation has been prepared under the direction and in the presence of  Murray Hodgkins, MD Electronically signed: Rosalie Doctor, Scribe.  05/31/2015  8:39 AM   I personally performed the services described in this documentation. All medical record entries made by the scribe were at my direction. I have reviewed the chart and agree that the record reflects my personal performance and  is accurate and complete. Murray Hodgkins, MD

## 2015-05-31 NOTE — Progress Notes (Signed)
Initial Nutrition Assessment  DOCUMENTATION CODES:   Obesity unspecified  INTERVENTION:  - If patient is not weaned from the vent in 24 hours, then recommended to begin feeding.  -Unable to meet >100% of protein needs at this time due to pt propofol rate.  -Recommend add Pro-Stat @ 59m mL QID if pt is unable to wean as expected. With current propofol regimen, this provides (1428 kcal, and 90 g protein, 0 g free water. This meets 100% of kcal, and 72% of protein requirements. Include 400 mL free water, six times daily if no IV fluids provided.  RD team to update order as patient is weaned from propofol.  - Recommend diet education on heart healthy diet as patient advances.  - RD team to continue to monitor patient for needs.    NUTRITION DIAGNOSIS:   Inadequate oral intake related to inability to eat as evidenced by NPO status.    GOAL:   Provide needs based on ASPEN/SCCM guidelines    MONITOR:   Diet advancement, Vent status, Labs, Weight trends, Skin, I & O's  REASON FOR ASSESSMENT:   Ventilator    ASSESSMENT:   49y.o. male admitted to ICU for oral swelling and respiratory distress. Diagnoised with angioedema due to allergic reaction to usual medication, lisinopril. Patient has previous history of neck pain, otitis media, pleurisy, CAD, sinus, MI, HTN, and tobacco and alcohol abuse.   Patient alone in room at time of visit for ventilator nutrition assessment. Per nurse, patient weaning from ventilator may occur tomorrow, and there are no plans to start tube feeding. Patient is currently sedated with 31.4 mL/hr Propofol. Due to patient's agitation, propofol levels were recently increased as noted in chart, from initial rate of 40 mcg/kg/min to current rate of 55 mcg/kg/min. This provides the patient with 828.96 kcal/day.   Patient is currently at a BMI of 34, obesity. Per chart, patient has recently gained 11 lbs over the past 3 months (5% weight change). Due to  ASPEN/SCCM  guidelines, hypocaloric feeding regimen is recommended to help improve nitrogen balance and minimize loss of lean body mass.   As patient advances, heart healthy diet education may be recommended.   Per chart, patient currently drinks 3-4 mixed drinks, but it is not clear how often this pattern is followed. Information provided by girlfriend. No information was able to be collected regarding usual diet.   NFPE results - well nourished.   Labs reviewed - blood glucose (192), Ca (8.7), p02 (56.7), pH (7.348), bilirubin (<0.1)  Medications reviewed - Diprivan, multivitamin  Diet Order:  Diet NPO time specified  Skin:  Reviewed, no issues  Last BM:  05/30/2015  Height:   Ht Readings from Last 1 Encounters:  05/31/15 '5\' 6"'$  (1.676 m)    Weight:   Wt Readings from Last 1 Encounters:  05/31/15 210 lb 1.6 oz (95.3 kg)    Ideal Body Weight:  64.5 kg  BMI:  Body mass index is 33.93 kg/(m^2).  Estimated Nutritional Needs:   Kcal:  1200-1400 kcal  Protein:  125-135 g  Fluid: 3 liters (normal needs)  EDUCATION NEEDS:   No education needs identified at this time  MKayleen Memos [facility] BS Dietetic Intern

## 2015-05-31 NOTE — Plan of Care (Signed)
Problem: Phase I Progression Outcomes Goal: Oral Care per Protocol ORAL CARE Q2HR Goal: HOB elevated 30 degrees Outcome: Progressing IN PROGRESS Goal: VAP prevention protocol initiated Outcome: Progressing IN PROGRESS    Goal: Sedation Protocol initiated if indicated Outcome: Progressing PT ON PROPOFOL DRIP AND PRN VERSED Goal: Code status addressed with pt/family Outcome: Completed/Met Date Met:  05/31/15 FULL CODE

## 2015-05-31 NOTE — Care Management Note (Signed)
Case Management Note  Patient Details  Name: Randy Hebert MRN: [id number] Date of Birth: Apr 14, 1966  Expected Discharge Date:  06/02/15               Expected Discharge Plan:  Home/Self Care  In-House Referral:  NA  Discharge planning Services  CM Consult  Post Acute Care Choice:    Choice offered to:     DME Arranged:    DME Agency:     HH Arranged:    HH Agency:     Status of Service:  In process, will continue to follow  Medicare Important Message Given:    Date Medicare IM Given:    Medicare IM give by:    Date Additional Medicare IM Given:    Additional Medicare Important Message give by:     If discussed at [place] of Stay Meetings, dates discussed:    Additional Comments: Pt is from home and ind at baseline. Pt admitted for angioedema, currently intubated and unable to communicate to complete assessment. Will cont to follow for DC planning.  Sherald Barge, RN 05/31/2015, 2:34 PM

## 2015-05-31 NOTE — Consult Note (Signed)
Consult requested RD:[facility] hospitalist, Dr. Sarajane Jews Consult requested for respiratory failure:  Randy Hebert is a 49 year old who was brought to the emergency department because of increasing trouble breathing and a feeling of swelling in his throat and trouble swallowing. He was started on lisinopril about a month ago. He has never had a similar episode. He was intubated in the emergency department because of respiratory distress and for airway protection so he can't give any history. His mother is at bedside and she can give some history. He apparently was doing fairly well but is known to have breathing problems. She says he coughs frequently during the day and appears to her to be short of breath with exertion. He smokes she says all the time but she's not sure how many a day. In addition to that he has a significant history of drinking 4-5 mixed drinks when he wants to relax and it's not really clear how frequently that is. He also has a previous history of heart attack about 3 months ago. He did not have any chest pain associated with this event.  Past Medical History  Diagnosis Date  . Neck pain   . Otitis media   . Pleurisy   . CAD (coronary artery disease)     lateral STEMI 02/06/2015 00% D1 occlusion treated with Promus Premier 2.5 mm x 16 mm DES, 70% ramus stenosis, 40% mid RCA stenosis, 45% distal RCA stenosis, EF 45-50%  . Sinus pause     9 sec sinus pause on telemetry after started on coreg after MI, avoid AV nodal blocking agent  . MI (myocardial infarction)      Family History  Problem Relation Age of Onset  . Heart attack Father   . Stroke Father      Social History   Social History  . Marital Status: Divorced    Spouse Name: N/A  . Number of Children: N/A  . Years of Education: N/A   Social History Main Topics  . Smoking status: Current Every Day Smoker -- 1.00 packs/day for 46 years    Types: Cigars  . Smokeless tobacco: None  . Alcohol Use: 3.0 - 3.6 oz/week    0  Standard drinks or equivalent, 5-6 Shots of liquor per week  . Drug Use: No  . Sexual Activity: Not Asked   Other Topics Concern  . None   Social History Narrative     ROS: not really obtainable. His mother says that she thinks he had asthma in childhood and that he probably has some COPD considering the symptoms that she sees.    Objective: Vital signs in last 24 hours: Temp:  [97.6 F (36.4 C)-98.4 F (36.9 C)] 97.6 F (36.4 C) (09/29 0707) Pulse Rate:  [59-105] 65 (09/29 0645) Resp:  [14-31] 17 (09/29 0645) BP: (81-154)/(50-94) 114/62 mmHg (09/29 0645) SpO2:  [90 %-100 %] 91 % (09/29 0645) FiO2 (%):  [45 %-50 %] 45 % (09/29 0400) Weight:  [95.255 kg (210 lb)-95.3 kg (210 lb 1.6 oz)] 95.3 kg (210 lb 1.6 oz) (09/29 0600) Weight change:  Last BM Date: 05/30/15  Intake/Output from previous day: 09/28 0701 - 09/29 0700 In: 1306.7 [I.V.:1286.7; NG/GT:20] Out: 1100 [Urine:800; Emesis/NG output:300]  PHYSICAL EXAM he is intubated and sedated but only mildly sedated at this point. He nods his head to questions. His pupils are reactive nodes and throat are clear. I can't see his tongue very clearly but it still seems to be swollen. His neck is supple. His chest  shows some rhonchi bilaterally. His heart is regular without gallop. His abdomen is soft without masses. Extremities showed no edema. Central nervous system examination grossly intact within the limits of him being sedated he does seem a little anxious and agitated  Lab Results: Basic Metabolic Panel:  Recent Labs  05/30/15 1443 05/31/15 0417  NA 137 139  K 3.8 4.2  CL 105 105  CO2 25 24  GLUCOSE 114* 192*  BUN 14 20  CREATININE 0.81 0.88  CALCIUM 8.3* 8.7*   Liver Function Tests:  Recent Labs  05/30/15 1443 05/31/15 0417  AST 24 25  ALT 28 30  ALKPHOS 74 78  BILITOT 0.7 0.6  PROT 7.0 6.8  ALBUMIN 3.7 3.6   No results for input(s): LIPASE, AMYLASE in the last 72 hours. No results for input(s):  AMMONIA in the last 72 hours. CBC:  Recent Labs  05/30/15 1443 05/31/15 0417  WBC 10.2 10.4  HGB 14.0 13.4  HCT 42.0 40.4  MCV 88.8 89.2  PLT 236 232   Cardiac Enzymes: No results for input(s): CKTOTAL, CKMB, CKMBINDEX, TROPONINI in the last 72 hours. BNP: No results for input(s): PROBNP in the last 72 hours. D-Dimer: No results for input(s): DDIMER in the last 72 hours. CBG: No results for input(s): GLUCAP in the last 72 hours. Hemoglobin A1C: No results for input(s): HGBA1C in the last 72 hours. Fasting Lipid Panel: No results for input(s): CHOL, HDL, LDLCALC, TRIG, CHOLHDL, LDLDIRECT in the last 72 hours. Thyroid Function Tests: No results for input(s): TSH, T4TOTAL, FREET4, T3FREE, THYROIDAB in the last 72 hours. Anemia Panel: No results for input(s): VITAMINB12, FOLATE, FERRITIN, TIBC, IRON, RETICCTPCT in the last 72 hours. Coagulation: No results for input(s): LABPROT, INR in the last 72 hours. Urine Drug Screen: Drugs of Abuse  No results found for: LABOPIA, COCAINSCRNUR, LABBENZ, AMPHETMU, THCU, LABBARB  Alcohol Level: No results for input(s): ETH in the last 72 hours. Urinalysis:  Recent Labs  05/30/15 1628  COLORURINE YELLOW  LABSPEC >1.030*  PHURINE 5.5  GLUCOSEU NEGATIVE  HGBUR MODERATE*  BILIRUBINUR NEGATIVE  KETONESUR NEGATIVE  PROTEINUR NEGATIVE  UROBILINOGEN 0.2  NITRITE NEGATIVE  LEUKOCYTESUR TRACE*   Misc. Labs:   ABGS:  Recent Labs  05/30/15 1610 05/31/15 0345  PHART 7.348* 7.395  PO2ART 79.7* 56.7*  TCO2 16.4  --   HCO3 22.4 23.6     MICROBIOLOGY: Recent Results (from the past 240 hour(s))  MRSA PCR Screening     Status: None   Collection Time: 05/30/15  5:08 PM  Result Value Ref Range Status   MRSA by PCR NEGATIVE NEGATIVE Final    Comment:        The GeneXpert MRSA Assay (FDA approved for NASAL specimens only), is one component of a comprehensive MRSA colonization surveillance program. It is not intended to  diagnose MRSA infection nor to guide or monitor treatment for MRSA infections.     Studies/Results: Dg Chest Port 1 View  05/31/2015   CLINICAL DATA:  Status post MI, current smoker.  EXAM: PORTABLE CHEST 1 VIEW  COMPARISON:  Portable chest x-ray of May 30, 2015  FINDINGS: The lungs remain mildly hypoinflated. The interstitial markings remain coarse especially in the right infrahilar region. The left hemidiaphragm is obscured. The retrocardiac region on the left remains dense. The cardiac silhouette remains enlarged. The pulmonary vascularity is slightly less engorged today.  The endotracheal tube tip lies approximately 3.2 cm above the carina. The esophagogastric tube tip projects below the  inferior margin of the image.  IMPRESSION: Persistent bilateral hypoinflation. There remains bibasilar atelectasis greater on the left. Slight interval improvement in the appearance of the pulmonary interstitium since yesterday's study may reflect resolving interstitial edema.   Electronically Signed   By: David  Martinique M.D.   On: 05/31/2015 07:24   Dg Chest Port 1 View  05/30/2015   CLINICAL DATA:  Intubation.  EXAM: PORTABLE CHEST 1 VIEW  COMPARISON:  02/06/2015  FINDINGS: The endotracheal tube is 2.6 cm above the carina. The heart is enlarged. The mediastinal contours are prominent but this is due to the supine position of the patient. There is vascular congestion and streaky atelectasis. No pleural effusions or focal infiltrates.  IMPRESSION: The endotracheal tube is 2.6 cm above the carina.  Cardiac enlargement with vascular congestion and streaky atelectasis. No definite infiltrates or effusions.   Electronically Signed   By: Marijo Sanes M.D.   On: 05/30/2015 15:48    Medications:  Prior to Admission:  Prescriptions prior to admission  Medication Sig Dispense Refill Last Dose  . aspirin EC 81 MG tablet Take 81 mg by mouth daily.   Taking  . atorvastatin (LIPITOR) 80 MG tablet TAKE 1 TABLET (80  MG TOTAL) BY MOUTH DAILY AT 6 PM.  11   . CRESTOR 20 MG tablet TAKE 1 Tablet BY MOUTH EVERY NIGHT AT BEDTIME FOR CHOLESTEROL  1   . HYDROcodone-acetaminophen (NORCO/VICODIN) 5-325 MG tablet Take by mouth. for pain  0   . lisinopril (PRINIVIL,ZESTRIL) 5 MG tablet Take 5 mg by mouth daily.  1   . Menthol-Methyl Salicylate (MUSCLE RUB) 10-15 % CREA Apply 1 application topically as needed for muscle pain.   Taking  . nitroGLYCERIN (NITROSTAT) 0.4 MG SL tablet Place 1 tablet (0.4 mg total) under the tongue every 5 (five) minutes as needed for chest pain. 25 tablet 3 Taking  . oxymetazoline (AFRIN) 0.05 % nasal spray Place 1 spray into both nostrils 2 (two) times daily as needed for congestion.   Taking  . rosuvastatin (CRESTOR) 10 MG tablet Take 1 tablet (10 mg total) by mouth daily. 30 tablet 6 Taking  . ticagrelor (BRILINTA) 90 MG TABS tablet Take 1 tablet (90 mg total) by mouth 2 (two) times daily. 180 tablet 3 Taking   Scheduled: . antiseptic oral rinse  7 mL Mouth Rinse QID  . chlorhexidine gluconate  15 mL Mouth Rinse BID  . enoxaparin (LOVENOX) injection  40 mg Subcutaneous Q24H  . ipratropium-albuterol  3 mL Nebulization Q4H  . methylPREDNISolone (SOLU-MEDROL) injection  125 mg Intravenous Q6H  . nicotine  21 mg Transdermal Daily  . pantoprazole (PROTONIX) IV  40 mg Intravenous Q24H  . banana bag IV 1000 mL   Intravenous Once  . sodium chloride  3 mL Intravenous Q12H   Continuous: . sodium chloride 75 mL/hr at 05/31/15 0600  . propofol (DIPRIVAN) infusion 45 mcg/kg/min (05/31/15 7564)   PPI:RJJOACZY (SUBLIMAZE) injection, fentaNYL (SUBLIMAZE) injection, hydrALAZINE, midazolam, ondansetron **OR** ondansetron (ZOFRAN) IV  Assesment:he has angioedema of the tongue and throat probably related to lisinopril that was recently started. He was intubated for airway protection and for respiratory distress and he is not ready for extubation yet. I think he still has some swelling of his tongue and  he is still requiring 50% oxygen. According to the history from his mother I think it's likely that he has some element of COPD as well.  He has history of coronary artery disease with recent STEMI  in June of this year. He has not had any chest pain.  He has hypertension which is being treated with lisinopril.  He has a significant history of alcohol and tobacco use and there is concern with his mild agitation that he may be having nicotine and more importantly alcohol withdrawal symptoms.  Principal Problem:   Angioedema Active Problems:   Essential hypertension   CAD (coronary artery disease)   STEMI (ST elevation myocardial infarction)   Tobacco use disorder    Plan:he is not ready for extubation so he needs to stay on the ventilator today. He is on propofol for sedation and may need more or we may need to switch him to Precedex considering his history of alcohol use. I have added Versed to his sedation orders. See how he does with her and if he doesn't seem to respond we will need to switch him to Precedex. He will be placed on nicotine patch and he will be treated for COPD. I agree with the steroids. He may need fresh frozen plasma if he does not seem to improve with withdrawal of the offending agent and steroids.I took the liberty of starting him on multiple vitamins folic acid and thiamine  Thanks for allowing me to see him with you    LOS: 1 day   HAWKINS,EDWARD L 05/31/2015, 7:46 AM

## 2015-06-01 ENCOUNTER — Inpatient Hospital Stay (HOSPITAL_COMMUNITY): Payer: Self-pay

## 2015-06-01 DIAGNOSIS — I5022 Chronic systolic (congestive) heart failure: Secondary | ICD-10-CM

## 2015-06-01 LAB — BLOOD GAS, ARTERIAL
ACID-BASE EXCESS: 1.5 mmol/L (ref 0.0–2.0)
Bicarbonate: 23.4 mEq/L (ref 20.0–24.0)
DRAWN BY: 317771
FIO2: 0.55
MECHVT: 550 mL
O2 Saturation: 91.9 %
PEEP: 5 cmH2O
PH ART: 7.428 (ref 7.350–7.450)
RATE: 14 resp/min
pCO2 arterial: 34.3 mmHg — ABNORMAL LOW (ref 35.0–45.0)
pO2, Arterial: 66 mmHg — ABNORMAL LOW (ref 80.0–100.0)

## 2015-06-01 MED ORDER — METHYLPREDNISOLONE SODIUM SUCC 125 MG IJ SOLR
80.0000 mg | Freq: Four times a day (QID) | INTRAMUSCULAR | Status: DC
  Administered 2015-06-01 – 2015-06-03 (×8): 80 mg via INTRAVENOUS
  Filled 2015-06-01 (×7): qty 2

## 2015-06-01 MED ORDER — ADULT MULTIVITAMIN LIQUID CH
5.0000 mL | Freq: Every day | ORAL | Status: DC
  Filled 2015-06-01 (×4): qty 5

## 2015-06-01 MED ORDER — PRO-STAT SUGAR FREE PO LIQD: 30.0000 mL | Freq: Four times a day (QID) | ORAL | Status: DC

## 2015-06-01 MED ORDER — ADULT MULTIVITAMIN LIQUID CH
5.0000 mL | Freq: Every day | ORAL | Status: DC
  Administered 2015-06-02 – 2015-06-03 (×2): 5 mL via ORAL
  Filled 2015-06-01 (×6): qty 5

## 2015-06-01 MED ORDER — FUROSEMIDE 10 MG/ML IJ SOLN
40.0000 mg | Freq: Once | INTRAMUSCULAR | Status: AC
  Administered 2015-06-01: 40 mg via INTRAVENOUS
  Filled 2015-06-01: qty 4

## 2015-06-01 NOTE — Care Management Note (Signed)
Case Management Note  Patient Details  Name: Randy Hebert MRN: [id number] Date of Birth: 05/27/1966  Expected Discharge Date:  06/02/15               Expected Discharge Plan:  Home/Self Care  In-House Referral:  Financial Counselor  Discharge planning Services  CM Consult  Post Acute Care Choice:    Choice offered to:     DME Arranged:    DME Agency:     HH Arranged:    [place] Agency:     Status of Service:  In process, will continue to follow  Medicare Important Message Given:    Date Medicare IM Given:    Medicare IM give by:    Date Additional Medicare IM Given:    Additional Medicare Important Message give by:     If discussed at [place] of Stay Meetings, dates discussed:    Additional Comments: Pt's gf, [name], present in room, [place] listed as emergency contact. [name] asked if pt has insurance (before proceeding with [facility] consult) and she says he does not and she has been working with the [facility] from the last admission. Pt's gf offered following information. [name] states that the pt is employed, goes to the free clinic for care and med assistance. He has filled out the paper work for financial assistance but has not heard a response yet. Pt was also issued a [employer] voucher earlier in the year and not eligible for another. The [facility] has contacted and notified that pt is back in hospital. Anticipate pt will dc home with self care and f/u at the free clinic [facility] [place]. No CM needs anticipated. Will cont to follow.  Sherald Barge, RN 06/01/2015, 11:41 AM

## 2015-06-01 NOTE — Progress Notes (Signed)
Nutrition Follow-up  DOCUMENTATION CODES:   Obesity unspecified  INTERVENTION:  - Recommend initiate feeding. Unable to meet > 100% of protein needs at this time due to pt propofol rate. Begin feeding by adding Pro-Stat @ 60 mL QID. With current propofol regimen, this provides (1853 kcal), (120 g protein), (0 g free water). This meets 100% of kcal, and 96% of protein needs. Include 400 mL free water, six times daily if no IV fluids provided.  - Order liquid multivitamin.  - RD team to update order as patient is weaned from propofol and care advances.  - Recommend patient for diet education as patient progresses.     NUTRITION DIAGNOSIS:   Inadequate oral intake related to inability to eat as evidenced by NPO status.    GOAL:   Provide needs based on ASPEN/SCCM guidelines     MONITOR:   Diet advancement, Vent status, Labs, Weight trends, Skin, I & O's  REASON FOR ASSESSMENT:   Ventilator    ASSESSMENT:   49 y.o. male admitted to ICU for oral swelling and respiratory distress. Diagnoised with angioedema due to allergic reaction to usual medication, lisinopril. Patient has previous history of neck pain, otitis media, pleurisy, CAD, sinus, MI, HTN, and tobacco and alcohol abuse.   Per chart, patient will continue on vent. Recommend to initiative tube feeding.Tube feeding recommendations from previous assessment have been updated to include the current Propofol rate. At time of visit, propofol rate was increased to 39.9 mL/hr. This provides 1053 kcal. Recommend to initiate feeding using ASPEN/SCCM   Girlfriend was in room with patient at time of assessment. She helped to provided some information on his usual diet pattern. Patient follows no special diet, and eats fried chicken, liver, and enjoys steak. Usually they cook at home and then go out to eat occasionally for [place] food. Throughout the day the patient likes to drink sprite and buttermilk.   Labs: pCO2 (34.3), pO2  (66.0), Ca (8.7), Glucose (192)  Medications: Prednisolone, propofol  Diet Order:  Diet NPO time specified  Skin:  Reviewed, no issues  Last BM:  05/30/2015  Height:   Ht Readings from Last 1 Encounters:  06/01/15 '5\' 6"'$  (1.676 m)    Weight:   Wt Readings from Last 1 Encounters:  06/01/15 218 lb 7.6 oz (99.1 kg)    Ideal Body Weight:  64.5 kg  BMI:  Body mass index is 35.28 kg/(m^2).  Estimated Nutritional Needs:   Kcal:  1200-1400 kcal  Protein:  125-135 g  Fluid:  3 L/day  EDUCATION NEEDS:   No education needs identified at this time  Kayleen Memos, [facility]. BS Dietetic Intern 06/01/2015 2:19 PM

## 2015-06-01 NOTE — Progress Notes (Signed)
Subjective: He remains intubated and on the ventilator. Oral suctioning is difficult so I think he probably still has significant angioedema but it's difficult to tell from his examination. We've had some equipment issues from his endotracheal tube because it keeps popping off from his ventilator so that will need to be addressed. His agitation seems a little better  Objective: Vital signs in last 24 hours: Temp:  [98 F (36.7 C)-99.4 F (37.4 C)] 98.3 F (36.8 C) (09/30 0734) Pulse Rate:  [63-86] 67 (09/30 0600) Resp:  [16-26] 19 (09/30 0600) BP: (92-124)/(49-79) 103/58 mmHg (09/30 0600) SpO2:  [89 %-97 %] 91 % (09/30 0600) FiO2 (%):  [50 %-55 %] 55 % (09/30 0321) Weight:  [99.1 kg (218 lb 7.6 oz)] 99.1 kg (218 lb 7.6 oz) (09/30 0400) Weight change: 3.845 kg (8 lb 7.6 oz) Last BM Date: 05/30/15  Intake/Output from previous day: 09/29 0701 - 09/30 0700 In: 4012.7 [I.V.:3552.7; NG/GT:360; IV Piggyback:100] Out: 1800 [Urine:1600; Emesis/NG output:200]  PHYSICAL EXAM General appearance: Intubated and sedated. When I open his mouth I can see his tongue but I can't get a good sense of how swollen it is Resp: rhonchi bilaterally Cardio: regular rate and rhythm, S1, S2 normal, no murmur, click, rub or gallop GI: soft, non-tender; bowel sounds normal; no masses,  no organomegaly Extremities: extremities normal, atraumatic, no cyanosis or edema  Lab Results:  Results for orders placed or performed during the hospital encounter of 05/30/15 (from the past 48 hour(s))  CBC     Status: None   Collection Time: 05/30/15  2:43 PM  Result Value Ref Range   WBC 10.2 4.0 - 10.5 K/uL   RBC 4.73 4.22 - 5.81 MIL/uL   Hemoglobin 14.0 13.0 - 17.0 g/dL   HCT 42.0 39.0 - 52.0 %   MCV 88.8 78.0 - 100.0 fL   MCH 29.6 26.0 - 34.0 pg   MCHC 33.3 30.0 - 36.0 g/dL   RDW 13.0 11.5 - 15.5 %   Platelets 236 150 - 400 K/uL  Basic metabolic panel     Status: Abnormal   Collection Time: 05/30/15  2:43 PM   Result Value Ref Range   Sodium 137 135 - 145 mmol/L   Potassium 3.8 3.5 - 5.1 mmol/L   Chloride 105 101 - 111 mmol/L   CO2 25 22 - 32 mmol/L   Glucose, Bld 114 (H) 65 - 99 mg/dL   BUN 14 6 - 20 mg/dL   Creatinine, Ser 0.81 0.61 - 1.24 mg/dL   Calcium 8.3 (L) 8.9 - 10.3 mg/dL   GFR calc non Af Amer >60 >60 mL/min   GFR calc Af Amer >60 >60 mL/min    Comment: (NOTE) The eGFR has been calculated using the CKD EPI equation. This calculation has not been validated in all clinical situations. eGFR's persistently <60 mL/min signify possible Chronic Kidney Disease.    Anion gap 7 5 - 15  Hepatic function panel     Status: Abnormal   Collection Time: 05/30/15  2:43 PM  Result Value Ref Range   Total Protein 7.0 6.5 - 8.1 g/dL   Albumin 3.7 3.5 - 5.0 g/dL   AST 24 15 - 41 U/L   ALT 28 17 - 63 U/L   Alkaline Phosphatase 74 38 - 126 U/L   Total Bilirubin 0.7 0.3 - 1.2 mg/dL   Bilirubin, Direct <0.1 (L) 0.1 - 0.5 mg/dL   Indirect Bilirubin NOT CALCULATED 0.3 - 0.9 mg/dL  Blood gas,  arterial     Status: Abnormal   Collection Time: 05/30/15  4:10 PM  Result Value Ref Range   FIO2 50.00    Delivery systems VENTILATOR    Mode PRESSURE REGULATED VOLUME CONTROL    VT 550 mL   LHR 14 resp/min   Peep/cpap 5.0 cm H20   pH, Arterial 7.348 (L) 7.350 - 7.450   pCO2 arterial 42.3 35.0 - 45.0 mmHg   pO2, Arterial 79.7 (L) 80.0 - 100.0 mmHg   Bicarbonate 22.4 20.0 - 24.0 mEq/L   TCO2 16.4 0 - 100 mmol/L   Acid-Base Excess 2.1 (H) 0.0 - 2.0 mmol/L   O2 Saturation 94.5 %   Patient temperature 37.0    Collection site LEFT RADIAL    Drawn by [id number]    Sample type ARTHROGRAPHIS SPECIES    Allens test (pass/fail) PASS PASS  Urinalysis, Routine w reflex microscopic (not at [facility])     Status: Abnormal   Collection Time: 05/30/15  4:28 PM  Result Value Ref Range   Color, Urine YELLOW YELLOW   APPearance CLEAR CLEAR   Specific Gravity, Urine >1.030 (H) 1.005 - 1.030   pH 5.5 5.0 - 8.0   Glucose,  UA NEGATIVE NEGATIVE mg/dL   Hgb urine dipstick MODERATE (A) NEGATIVE   Bilirubin Urine NEGATIVE NEGATIVE   Ketones, ur NEGATIVE NEGATIVE mg/dL   Protein, ur NEGATIVE NEGATIVE mg/dL   Urobilinogen, UA 0.2 0.0 - 1.0 mg/dL   Nitrite NEGATIVE NEGATIVE   Leukocytes, UA TRACE (A) NEGATIVE  Urine microscopic-add on     Status: None   Collection Time: 05/30/15  4:28 PM  Result Value Ref Range   Squamous Epithelial / LPF RARE RARE   WBC, UA 7-10 <3 WBC/hpf   RBC / HPF 3-6 <3 RBC/hpf   Bacteria, UA RARE RARE  MRSA PCR Screening     Status: None   Collection Time: 05/30/15  5:08 PM  Result Value Ref Range   MRSA by PCR NEGATIVE NEGATIVE    Comment:        The GeneXpert MRSA Assay (FDA approved for NASAL specimens only), is one component of a comprehensive MRSA colonization surveillance program. It is not intended to diagnose MRSA infection nor to guide or monitor treatment for MRSA infections.   Blood gas, arterial     Status: Abnormal   Collection Time: 05/31/15  3:45 AM  Result Value Ref Range   FIO2 0.45    Delivery systems VENTILATOR    Mode PRESSURE REGULATED VOLUME CONTROL    VT 550 mL   LHR 14.0 resp/min   Peep/cpap 5.0 cm H20   pH, Arterial 7.395 7.350 - 7.450   pCO2 arterial 39.2 35.0 - 45.0 mmHg   pO2, Arterial 56.7 (L) 80.0 - 100.0 mmHg   Bicarbonate 23.6 20.0 - 24.0 mEq/L   Acid-Base Excess 0.8 0.0 - 2.0 mmol/L   O2 Saturation 88.2 %   Collection site RIGHT RADIAL    Drawn by [id number]    Sample type ARTERIAL DRAW    Allens test (pass/fail) PASS PASS  Comprehensive metabolic panel     Status: Abnormal   Collection Time: 05/31/15  4:17 AM  Result Value Ref Range   Sodium 139 135 - 145 mmol/L   Potassium 4.2 3.5 - 5.1 mmol/L   Chloride 105 101 - 111 mmol/L   CO2 24 22 - 32 mmol/L   Glucose, Bld 192 (H) 65 - 99 mg/dL   BUN 20 6 - 20 mg/dL  Creatinine, Ser 0.88 0.61 - 1.24 mg/dL   Calcium 8.7 (L) 8.9 - 10.3 mg/dL   Total Protein 6.8 6.5 - 8.1 g/dL   Albumin  3.6 3.5 - 5.0 g/dL   AST 25 15 - 41 U/L   ALT 30 17 - 63 U/L   Alkaline Phosphatase 78 38 - 126 U/L   Total Bilirubin 0.6 0.3 - 1.2 mg/dL   GFR calc non Af Amer >60 >60 mL/min   GFR calc Af Amer >60 >60 mL/min    Comment: (NOTE) The eGFR has been calculated using the CKD EPI equation. This calculation has not been validated in all clinical situations. eGFR's persistently <60 mL/min signify possible Chronic Kidney Disease.    Anion gap 10 5 - 15  CBC     Status: None   Collection Time: 05/31/15  4:17 AM  Result Value Ref Range   WBC 10.4 4.0 - 10.5 K/uL   RBC 4.53 4.22 - 5.81 MIL/uL   Hemoglobin 13.4 13.0 - 17.0 g/dL   HCT 40.4 39.0 - 52.0 %   MCV 89.2 78.0 - 100.0 fL   MCH 29.6 26.0 - 34.0 pg   MCHC 33.2 30.0 - 36.0 g/dL   RDW 12.9 11.5 - 15.5 %   Platelets 232 150 - 400 K/uL  Blood gas, arterial     Status: Abnormal   Collection Time: 06/01/15  3:27 AM  Result Value Ref Range   FIO2 0.55    Delivery systems VENTILATOR    Mode PRESSURE REGULATED VOLUME CONTROL    VT 550 mL   LHR 14.0 resp/min   Peep/cpap 5.0 cm H20   pH, Arterial 7.428 7.350 - 7.450   pCO2 arterial 34.3 (L) 35.0 - 45.0 mmHg   pO2, Arterial 66.0 (L) 80.0 - 100.0 mmHg   Bicarbonate 23.4 20.0 - 24.0 mEq/L   Acid-Base Excess 1.5 0.0 - 2.0 mmol/L   O2 Saturation 91.9 %   Collection site RIGHT RADIAL    Drawn by [id number]    Sample type ARTERIAL DRAW    Allens test (pass/fail) PASS PASS    ABGS  Recent Labs  05/30/15 1610  06/01/15 0327  PHART 7.348*  < > 7.428  PO2ART 79.7*  < > 66.0*  TCO2 16.4  --   --   HCO3 22.4  < > 23.4  < > = values in this interval not displayed. CULTURES Recent Results (from the past 240 hour(s))  MRSA PCR Screening     Status: None   Collection Time: 05/30/15  5:08 PM  Result Value Ref Range Status   MRSA by PCR NEGATIVE NEGATIVE Final    Comment:        The GeneXpert MRSA Assay (FDA approved for NASAL specimens only), is one component of a comprehensive MRSA  colonization surveillance program. It is not intended to diagnose MRSA infection nor to guide or monitor treatment for MRSA infections.    Studies/Results: Dg Chest Port 1 View  05/31/2015   CLINICAL DATA:  Status post MI, current smoker.  EXAM: PORTABLE CHEST 1 VIEW  COMPARISON:  Portable chest x-ray of May 30, 2015  FINDINGS: The lungs remain mildly hypoinflated. The interstitial markings remain coarse especially in the right infrahilar region. The left hemidiaphragm is obscured. The retrocardiac region on the left remains dense. The cardiac silhouette remains enlarged. The pulmonary vascularity is slightly less engorged today.  The endotracheal tube tip lies approximately 3.2 cm above the carina. The esophagogastric tube tip projects below  the inferior margin of the image.  IMPRESSION: Persistent bilateral hypoinflation. There remains bibasilar atelectasis greater on the left. Slight interval improvement in the appearance of the pulmonary interstitium since yesterday's study may reflect resolving interstitial edema.   Electronically Signed   By: David  Martinique M.D.   On: 05/31/2015 07:24   Dg Chest Port 1 View  05/30/2015   CLINICAL DATA:  Intubation.  EXAM: PORTABLE CHEST 1 VIEW  COMPARISON:  02/06/2015  FINDINGS: The endotracheal tube is 2.6 cm above the carina. The heart is enlarged. The mediastinal contours are prominent but this is due to the supine position of the patient. There is vascular congestion and streaky atelectasis. No pleural effusions or focal infiltrates.  IMPRESSION: The endotracheal tube is 2.6 cm above the carina.  Cardiac enlargement with vascular congestion and streaky atelectasis. No definite infiltrates or effusions.   Electronically Signed   By: Marijo Sanes M.D.   On: 05/30/2015 15:48    Medications:  Prior to Admission:  Prescriptions prior to admission  Medication Sig Dispense Refill Last Dose  . aspirin EC 81 MG tablet Take 81 mg by mouth daily.   Taking   . CRESTOR 20 MG tablet TAKE 1 Tablet BY MOUTH EVERY NIGHT AT BEDTIME FOR CHOLESTEROL  1   . HYDROcodone-acetaminophen (NORCO/VICODIN) 5-325 MG tablet Take by mouth. for pain  0   . lisinopril (PRINIVIL,ZESTRIL) 5 MG tablet Take 5 mg by mouth daily.  1   . Menthol-Methyl Salicylate (MUSCLE RUB) 10-15 % CREA Apply 1 application topically as needed for muscle pain.   Taking  . nitroGLYCERIN (NITROSTAT) 0.4 MG SL tablet Place 1 tablet (0.4 mg total) under the tongue every 5 (five) minutes as needed for chest pain. 25 tablet 3 Taking  . oxymetazoline (AFRIN) 0.05 % nasal spray Place 1 spray into both nostrils 2 (two) times daily as needed for congestion.   Taking  . rosuvastatin (CRESTOR) 10 MG tablet Take 1 tablet (10 mg total) by mouth daily. 30 tablet 6 Taking  . ticagrelor (BRILINTA) 90 MG TABS tablet Take 1 tablet (90 mg total) by mouth 2 (two) times daily. 180 tablet 3 Taking  . [DISCONTINUED] atorvastatin (LIPITOR) 80 MG tablet TAKE 1 TABLET (80 MG TOTAL) BY MOUTH DAILY AT 6 PM.  11    Scheduled: . antiseptic oral rinse  7 mL Mouth Rinse QID  . aspirin  81 mg Per Tube Daily  . chlorhexidine gluconate  15 mL Mouth Rinse BID  . diphenhydrAMINE  25 mg Per Tube Q6H  . enoxaparin (LOVENOX) injection  40 mg Subcutaneous Q24H  . famotidine (PEPCID) IV  20 mg Intravenous Q12H  . ipratropium-albuterol  3 mL Nebulization Q4H  . methylPREDNISolone (SOLU-MEDROL) injection  125 mg Intravenous Q6H  . nicotine  21 mg Transdermal Daily  . rosuvastatin  20 mg Per Tube q1800  . sodium chloride  3 mL Intravenous Q12H  . ticagrelor  90 mg Per Tube BID   Continuous: . sodium chloride 75 mL/hr at 06/01/15 0600  . propofol (DIPRIVAN) infusion 60 mcg/kg/min (06/01/15 0600)   WHQ:PRFFMBWG (SUBLIMAZE) injection, hydrALAZINE, midazolam, ondansetron **OR** ondansetron (ZOFRAN) IV  Assesment: He was admitted with angioedema presumably from lisinopril. He is on Benadryl steroids Pepcid and I can't tell there is  much difference. He has respiratory failure and was intubated for airway protection but he also has what I believe is COPD. He is not ready for any attempt at weaning. He is on 55% oxygen and still  has too much swelling in his upper airway. Principal Problem:   Angioedema Active Problems:   Essential hypertension   CAD (coronary artery disease)   STEMI (ST elevation myocardial infarction)   Tobacco use disorder    Plan: Continue his treatments with steroids, H1 and H2 blockers continue treatments for his known cardiac disease continue nebulizers and continue ventilator support. He is critically ill with multiple medical problems and requires complex decision-making.    LOS: 2 days   HAWKINS,EDWARD L 06/01/2015, 7:48 AM

## 2015-06-01 NOTE — Progress Notes (Signed)
PROGRESS NOTE  Randy Hebert NFA:[id number] DOB: 05-Mar-1966 DOA: 05/30/2015 PCP: No PCP Per Patient  Summary: 49 y/o male with a hx of HTN presented to the emergency department with complaints of difficulty breathing, difficulty swallowing water, and feeling of choking. Per patient's girlfriend, he was recently started on Lisinopril 1 month ago. While in the ED, the patient was intubated secondary to being in respiratory distress. Labs and CXR were unremarkable. He was admitted for further management.   Assessment/Plan: 1. Severe angioedema of pharynx resulting in airway compromise, presumed secondary to lisinopril. Emergently intubated 9/28. Pulmonology following, still unable to extubate, continue current treatment 2. Hypertension, stable. Continue antihypertensives. 3. CAD, s/p STEMI 01/2015, sinus pause secondary to beta-blocker. Continue dual antiplatelet therapy minimum 1 year. Continue rosuvastatin. 4. Chronic systolic CHF 5. Tobacco use disorder 6. Obesity   Not ready for extubation. Airway remains compromised.  Continue steroids and H1 and H2 blockers.  Continue ASA and ticagrelor per tube  Will extubate when edema has decreased  Start Lasix for chronic systolic dysfunction  Code Status: Full DVT prophylaxis: Lovenox Family discussion: Discussed plan in detail with girlfirend. No further concerns at this time. Disposition Plan: Home when improved  Murray Hodgkins, MD  [facility]  Pager [phone] If 7PM-7AM, please contact night-coverage at [email or web address], password [facility] 06/01/2015, 7:56 AM  LOS: 2 days   Consultants:  Pulmonology   Nutrition  Procedures:  ETT >> 9/28  Antibiotics:    HPI/Subjective: Intubated and sedated. Per RN no issues overnight.   Objective: Filed Vitals:   06/01/15 0500 06/01/15 0530 06/01/15 0600 06/01/15 0734  BP: 102/71 110/62 103/58   Pulse: 81 71 67   Temp:    98.3 F (36.8 C)  TempSrc:    Axillary  Resp: '24 21 19    '$ Height:      Weight:      SpO2: 92% 92% 91%     Intake/Output Summary (Last 24 hours) at 06/01/15 0756 Last data filed at 06/01/15 0600  Gross per 24 hour  Intake 4012.7 ml  Output   1800 ml  Net 2212.7 ml     Filed Weights   05/30/15 1357 05/31/15 0600 06/01/15 0400  Weight: 95.255 kg (210 lb) 95.3 kg (210 lb 1.6 oz) 99.1 kg (218 lb 7.6 oz)    Exam: VSS, Not hypoxic, afebrile General: intubated, sedated appears calm. Eyes: Limited exam but normal ENT: Limited exam but normal. Lips appear unremarkable. Neck: Thick, difficult to assess. Cardiovascular: RRR, no m/r/g. No LE edema. Bilateral feel warm and dry. 2+ dp pulses bilaterally Telemetry: SR, no arrhythmias  Respiratory: CTA bilaterally, no w/r/r. Normal respiration on vent Abdomen: soft, ntnd Musculoskeletal: cannot assess Psychiatric: not assessable  Neurologic: not assessable   New data reviewed:  ABG 7.428/34/66  UOP 1600  Pertinent data since admission:  CXR independently reviewed mild interstitial edema pattern   Pending data:    Scheduled Meds: . antiseptic oral rinse  7 mL Mouth Rinse QID  . aspirin  81 mg Per Tube Daily  . chlorhexidine gluconate  15 mL Mouth Rinse BID  . diphenhydrAMINE  25 mg Per Tube Q6H  . enoxaparin (LOVENOX) injection  40 mg Subcutaneous Q24H  . famotidine (PEPCID) IV  20 mg Intravenous Q12H  . ipratropium-albuterol  3 mL Nebulization Q4H  . methylPREDNISolone (SOLU-MEDROL) injection  125 mg Intravenous Q6H  . nicotine  21 mg Transdermal Daily  . rosuvastatin  20 mg Per Tube q1800  . sodium chloride  3 mL Intravenous Q12H  . ticagrelor  90 mg Per Tube BID   Continuous Infusions: . sodium chloride 75 mL/hr at 06/01/15 0600  . propofol (DIPRIVAN) infusion 65 mcg/kg/min (06/01/15 0755)    Principal Problem:   Angioedema Active Problems:   Essential hypertension   CAD (coronary artery disease)   STEMI (ST elevation myocardial infarction)   Tobacco use  disorder   Chronic systolic CHF (congestive heart failure)   Time spent 25 minutes  By signing my name below, I, Rhett Bannister attest that this documentation has been prepared under the direction and in the presence of Murray Hodgkins, MD   Electronically signed: Rhett Bannister  06/01/2015 9:00 AM   I personally performed the services described in this documentation. All medical record entries made by the scribe were at my direction. I have reviewed the chart and agree that the record reflects my personal performance and is accurate and complete. Murray Hodgkins, MD

## 2015-06-02 ENCOUNTER — Inpatient Hospital Stay (HOSPITAL_COMMUNITY): Payer: Self-pay

## 2015-06-02 DIAGNOSIS — J9601 Acute respiratory failure with hypoxia: Secondary | ICD-10-CM

## 2015-06-02 DIAGNOSIS — T464X5A Adverse effect of angiotensin-converting-enzyme inhibitors, initial encounter: Secondary | ICD-10-CM

## 2015-06-02 DIAGNOSIS — I5023 Acute on chronic systolic (congestive) heart failure: Secondary | ICD-10-CM

## 2015-06-02 DIAGNOSIS — T783XXA Angioneurotic edema, initial encounter: Secondary | ICD-10-CM

## 2015-06-02 LAB — CBC
HEMATOCRIT: 39.3 % (ref 39.0–52.0)
HEMOGLOBIN: 12.6 g/dL — AB (ref 13.0–17.0)
MCH: 29.3 pg (ref 26.0–34.0)
MCHC: 32.1 g/dL (ref 30.0–36.0)
MCV: 91.4 fL (ref 78.0–100.0)
Platelets: 261 10*3/uL (ref 150–400)
RBC: 4.3 MIL/uL (ref 4.22–5.81)
RDW: 13.1 % (ref 11.5–15.5)
WBC: 10 10*3/uL (ref 4.0–10.5)

## 2015-06-02 LAB — BASIC METABOLIC PANEL
ANION GAP: 6 (ref 5–15)
BUN: 22 mg/dL — ABNORMAL HIGH (ref 6–20)
CO2: 24 mmol/L (ref 22–32)
Calcium: 8.2 mg/dL — ABNORMAL LOW (ref 8.9–10.3)
Chloride: 111 mmol/L (ref 101–111)
Creatinine, Ser: 0.83 mg/dL (ref 0.61–1.24)
GFR calc Af Amer: >60 mL/min (ref >60)
Glucose, Bld: 195 mg/dL — ABNORMAL HIGH (ref 65–99)
POTASSIUM: 3.6 mmol/L (ref 3.5–5.1)
SODIUM: 141 mmol/L (ref 135–145)

## 2015-06-02 LAB — GLUCOSE, CAPILLARY
GLUCOSE-CAPILLARY: 210 mg/dL — AB (ref 65–99)
Glucose-Capillary: 204 mg/dL — ABNORMAL HIGH (ref 65–99)
Glucose-Capillary: 177 mg/dL — ABNORMAL HIGH (ref 65–99)
Glucose-Capillary: 169 mg/dL — ABNORMAL HIGH (ref 65–99)

## 2015-06-02 LAB — TRIGLYCERIDES: TRIGLYCERIDES: 432 mg/dL — AB (ref <150)

## 2015-06-02 MED ORDER — MIDAZOLAM HCL 2 MG/2ML IJ SOLN: 2.0000 mg | INTRAMUSCULAR | Status: DC | PRN

## 2015-06-02 MED ORDER — SODIUM CHLORIDE 0.9 % IV SOLN
25.0000 ug/h | INTRAVENOUS | Status: DC
  Filled 2015-06-02: qty 50

## 2015-06-02 MED ORDER — MIDAZOLAM HCL 2 MG/2ML IJ SOLN
2.0000 mg | INTRAMUSCULAR | Status: DC | PRN
Start: 2015-06-02 — End: 2015-06-02

## 2015-06-02 MED ORDER — PRO-STAT SUGAR FREE PO LIQD
60.0000 mL | Freq: Four times a day (QID) | ORAL | Status: DC
  Administered 2015-06-02 – 2015-06-03 (×5): 60 mL via ORAL
  Filled 2015-06-02 (×5): qty 60

## 2015-06-02 MED ORDER — ACETAMINOPHEN 160 MG/5ML PO SOLN
500.0000 mg | Freq: Four times a day (QID) | ORAL | Status: DC | PRN
  Administered 2015-06-02: 500 mg via ORAL
  Filled 2015-06-02: qty 20.3

## 2015-06-02 MED ORDER — FENTANYL BOLUS VIA INFUSION
50.0000 ug | INTRAVENOUS | Status: DC | PRN
  Filled 2015-06-02: qty 50

## 2015-06-02 MED ORDER — FUROSEMIDE 10 MG/ML IJ SOLN
40.0000 mg | Freq: Two times a day (BID) | INTRAMUSCULAR | Status: AC
  Administered 2015-06-02 (×2): 40 mg via INTRAVENOUS
  Filled 2015-06-02 (×2): qty 4

## 2015-06-02 MED ORDER — TRAMADOL HCL 50 MG PO TABS
50.0000 mg | ORAL_TABLET | Freq: Once | ORAL | Status: AC
  Administered 2015-06-02: 50 mg via ORAL
  Filled 2015-06-02: qty 1

## 2015-06-02 MED ORDER — FENTANYL CITRATE (PF) 100 MCG/2ML IJ SOLN: 50.0000 ug | Freq: Once | INTRAMUSCULAR | Status: DC

## 2015-06-02 MED ORDER — VITAL HIGH PROTEIN PO LIQD
1000.0000 mL | ORAL | Status: DC
  Filled 2015-06-02 (×3): qty 1000

## 2015-06-02 MED ORDER — RACEPINEPHRINE HCL 2.25 % IN NEBU: 0.5000 mL | INHALATION_SOLUTION | RESPIRATORY_TRACT | Status: DC | PRN

## 2015-06-02 MED ORDER — VITAL HIGH PROTEIN PO LIQD
1000.0000 mL | ORAL | Status: DC
  Filled 2015-06-02 (×2): qty 1000

## 2015-06-02 MED ORDER — ACETAMINOPHEN 325 MG PO TABS: 650.0000 mg | ORAL_TABLET | Freq: Four times a day (QID) | ORAL | Status: DC | PRN

## 2015-06-02 MED ORDER — INSULIN ASPART 100 UNIT/ML ~~LOC~~ SOLN: 1.0000 [IU] | SUBCUTANEOUS | Status: DC

## 2015-06-02 NOTE — Progress Notes (Signed)
Enter room patient to have ET tube in rt hand. Oxygen supplied via manual resuscitator. Patient following commands. Eyes open. Diprivan stopped.

## 2015-06-02 NOTE — Progress Notes (Addendum)
PROGRESS NOTE  REFAEL Hebert QVZ:[id number] DOB: 1966-02-22 DOA: 05/30/2015 PCP: No PCP Per Patient  Summary: 49 y/o male with a hx of HTN presented to the emergency department with complaints of difficulty breathing, difficulty swallowing water, and feeling of choking. Per patient's girlfriend, he was recently started on Lisinopril 1 month ago. While in the ED, the patient was intubated secondary to being in respiratory distress. Labs and CXR were unremarkable. He was admitted for further management.   Assessment/Plan:  Severe angioedema of pharynx resulting in airway compromise, presumed secondary to lisinopril. Emergently intubated 9/28. Pulmonology and nutrition following. Still unable to extubate so will begin tube feedings.   Acute hypoxic/hypoxemic respiratory failure. Suspect underlying COPD, possible component of CHF.  Possible acute on chronic systolic CHF. CXR shows increased edema pattern. I/O + 2L.   Hypertension, stable. Continue antihypertensives.  CAD, s/p STEMI 01/2015, sinus pause secondary to beta-blocker. Continue dual antiplatelet therapy minimum 1 year. Continue rosuvastatin.  Tobacco use disorder  Obesity   Not ready for extubation. Airway remains compromised and FiO2 at 60%. Begin tube feedings as per nutrition.  Continue steroids, Lasix, and H1 and H2 blockers.  Continue ASA and ticagrelor per tube  Will extubate when edema has decreased  Check CXR in morning. Discussed with Dr. Luan Pulling.   ADDENDUM Patient self-extubated this afternoon. Alert, appears calm, comfortable Voice hoarse but speech intelligible Vitals stable, SpO2 90s on Veni mask Oropharynx appears clear, uvula appears unremarkable, Mallampati 3. Tongue and lips appear unremarkable.  Seems to be tolerating well at this time, no obvious airway edema. Will monitor closely but no indication for reintubation at this time.  Code Status: Full DVT prophylaxis: Lovenox Family discussion:  Discussed plan in detail with girlfriend at bedside. No further concerns at this time. Disposition Plan: Home when improved  Randy Hodgkins, MD  [facility]  Pager [phone] If 7PM-7AM, please contact night-coverage at [email or web address], password [facility] 06/02/2015, 6:09 AM  LOS: 3 days   Consultants:  Pulmonology   Nutrition  Procedures:  ETT  9/28 >>  Antibiotics:    HPI/Subjective: Intubated and sedated.   Objective: Filed Vitals:   06/02/15 0300 06/02/15 0330 06/02/15 0351 06/02/15 0441  BP: 114/64 111/63    Pulse: 74 71    Temp:    98.6 F (37 C)  TempSrc:    Axillary  Resp: 22 21    Height:      Weight:    97.9 kg (215 lb 13.3 oz)  SpO2: 91% 91% 92%     Intake/Output Summary (Last 24 hours) at 06/02/15 0609 Last data filed at 06/02/15 0441  Gross per 24 hour  Intake 2691.45 ml  Output   3375 ml  Net -683.55 ml     Filed Weights   05/31/15 0600 06/01/15 0400 06/02/15 0441  Weight: 95.3 kg (210 lb 1.6 oz) 99.1 kg (218 lb 7.6 oz) 97.9 kg (215 lb 13.3 oz)    Exam: VSS, Not hypoxic, afebrile General: intubated. Awakens to voice and follows simple commands Eyes: PERRL, normal lids, irises & conjunctiva ENT: Limited exam but normal. Lips appear unremarkable. Neck: no LAD, masses or thyromegaly Cardiovascular: RRR, no m/r/g. No LE edema. Bilateral feel warm and dry. 2+ dp pulses bilaterally Telemetry: SR, no arrhythmias  Respiratory: CTA bilaterally, no w/r/r. Normal respiration on vent Abdomen: soft, ntnd Musculoskeletal: moves all extremities to commands Psychiatric: grossly normal mood and affect, limited but follows commands Neurologic: grossly non-focal.   New data reviewed:  ABG 7.468 /  27.7 / 67.3  UOP 3975  BMP unremarkable  Triglycerides 432  CBC unremarkable, Hbg 12.6  CXR independently reviewed, pulmonary venous congestion  Pertinent data since admission:  CXR independently reviewed mild interstitial edema pattern  Pending  data:    Scheduled Meds: . antiseptic oral rinse  7 mL Mouth Rinse QID  . aspirin  81 mg Per Tube Daily  . chlorhexidine gluconate  15 mL Mouth Rinse BID  . diphenhydrAMINE  25 mg Per Tube Q6H  . enoxaparin (LOVENOX) injection  40 mg Subcutaneous Q24H  . famotidine (PEPCID) IV  20 mg Intravenous Q12H  . ipratropium-albuterol  3 mL Nebulization Q4H  . methylPREDNISolone (SOLU-MEDROL) injection  80 mg Intravenous Q6H  . multivitamin  5 mL Oral Daily  . nicotine  21 mg Transdermal Daily  . rosuvastatin  20 mg Per Tube q1800  . sodium chloride  3 mL Intravenous Q12H  . ticagrelor  90 mg Per Tube BID   Continuous Infusions: . sodium chloride 75 mL/hr at 06/02/15 0348  . propofol (DIPRIVAN) infusion 70 mcg/kg/min (06/02/15 0601)    Principal Problem:   Angioedema Active Problems:   Essential hypertension   CAD (coronary artery disease)   STEMI (ST elevation myocardial infarction)   Tobacco use disorder   Chronic systolic CHF (congestive heart failure)   Acute respiratory failure with hypoxia (HCC)   Acute respiratory failure with hypoxemia (HCC)   Acute on chronic systolic heart failure ([place])   Time spent 30 minutes  By signing my name below, I, Rosalie Doctor attest that this documentation has been prepared under the direction and in the presence of Randy Hodgkins, MD Electronically signed: Rosalie Doctor, Scribe.  06/02/2015 8:55AM  I personally performed the services described in this documentation. All medical record entries made by the scribe were at my direction. I have reviewed the chart and agree that the record reflects my personal performance and is accurate and complete. Randy Hodgkins, MD

## 2015-06-02 NOTE — Progress Notes (Signed)
Subjective: He remains intubated on the ventilator. He is still requiring 60% oxygen so we really don't have much opportunity for any sort of weaning. He was admitted because of angioedema likely related to lisinopril. It's difficult to be certain about the swelling of his tongue now but he still having trouble with oral suctioning.  Objective: Vital signs in last 24 hours: Temp:  [98.5 F (36.9 C)-98.7 F (37.1 C)] 98.6 F (37 C) (10/01 0441) Pulse Rate:  [63-88] 78 (10/01 0600) Resp:  [15-28] 22 (10/01 0600) BP: (100-133)/(57-90) 115/65 mmHg (10/01 0600) SpO2:  [86 %-98 %] 93 % (10/01 0750) FiO2 (%):  [60 %] 60 % (10/01 0750) Weight:  [97.9 kg (215 lb 13.3 oz)] 97.9 kg (215 lb 13.3 oz) (10/01 0441) Weight change: -1.2 kg (-2 lb 10.3 oz) Last BM Date: 05/30/15  Intake/Output from previous day: 09/30 0701 - 10/01 0700 In: 2857.7 [I.V.:2807.7; IV Piggyback:50] Out: 0511 [Urine:3275; Emesis/NG output:100]  PHYSICAL EXAM General appearance: Intubated sedated on the ventilator. It is hard to assess the swelling of his tongue Resp: rhonchi bilaterally Cardio: regular rate and rhythm, S1, S2 normal, no murmur, click, rub or gallop GI: soft, non-tender; bowel sounds normal; no masses,  no organomegaly Extremities: extremities normal, atraumatic, no cyanosis or edema  Lab Results:  Results for orders placed or performed during the hospital encounter of 05/30/15 (from the past 48 hour(s))  Blood gas, arterial     Status: Abnormal   Collection Time: 06/01/15  3:27 AM  Result Value Ref Range   FIO2 0.55    Delivery systems VENTILATOR    Mode PRESSURE REGULATED VOLUME CONTROL    VT 550 mL   LHR 14.0 resp/min   Peep/cpap 5.0 cm H20   pH, Arterial 7.428 7.350 - 7.450   pCO2 arterial 34.3 (L) 35.0 - 45.0 mmHg   pO2, Arterial 66.0 (L) 80.0 - 100.0 mmHg   Bicarbonate 23.4 20.0 - 24.0 mEq/L   Acid-Base Excess 1.5 0.0 - 2.0 mmol/L   O2 Saturation 91.9 %   Collection site RIGHT RADIAL    Drawn by [id number]    Sample type ARTERIAL DRAW    Allens test (pass/fail) PASS PASS  Blood gas, arterial     Status: Abnormal (Preliminary result)   Collection Time: 06/02/15  4:08 AM  Result Value Ref Range   FIO2 60.00    Delivery systems VENTILATOR    Mode PRESSURE REGULATED VOLUME CONTROL    VT 550 mL   LHR 14 resp/min   Peep/cpap 5.0 cm H20   pH, Arterial 7.468 (H) 7.350 - 7.450   pCO2 arterial 27.7 (L) 35.0 - 45.0 mmHg   pO2, Arterial 67.3 (L) 80.0 - 100.0 mmHg   Bicarbonate 22.3 20.0 - 24.0 mEq/L   TCO2 15.9 0 - 100 mmol/L   Acid-Base Excess 3.3 (H) 0.0 - 2.0 mmol/L   O2 Saturation 93.4 %   Patient temperature 37.0    Oxygen index PENDING    Collection site LEFT RADIAL    Drawn by [id number]    Sample type ARTERIAL    Allens test (pass/fail) PASS PASS   Mechanical Rate PENDING   Triglycerides     Status: Abnormal   Collection Time: 06/02/15  4:48 AM  Result Value Ref Range   Triglycerides 432 (H) <150 mg/dL  Basic metabolic panel     Status: Abnormal   Collection Time: 06/02/15  4:48 AM  Result Value Ref Range   Sodium 141 135 - 145 mmol/L  Potassium 3.6 3.5 - 5.1 mmol/L   Chloride 111 101 - 111 mmol/L   CO2 24 22 - 32 mmol/L   Glucose, Bld 195 (H) 65 - 99 mg/dL   BUN 22 (H) 6 - 20 mg/dL   Creatinine, Ser 0.83 0.61 - 1.24 mg/dL   Calcium 8.2 (L) 8.9 - 10.3 mg/dL   GFR calc non Af Amer >60 >60 mL/min   GFR calc Af Amer >60 >60 mL/min    Comment: (NOTE) The eGFR has been calculated using the CKD EPI equation. This calculation has not been validated in all clinical situations. eGFR's persistently <60 mL/min signify possible Chronic Kidney Disease.    Anion gap 6 5 - 15  CBC     Status: Abnormal   Collection Time: 06/02/15  4:48 AM  Result Value Ref Range   WBC 10.0 4.0 - 10.5 K/uL   RBC 4.30 4.22 - 5.81 MIL/uL   Hemoglobin 12.6 (L) 13.0 - 17.0 g/dL   HCT 39.3 39.0 - 52.0 %   MCV 91.4 78.0 - 100.0 fL   MCH 29.3 26.0 - 34.0 pg   MCHC 32.1 30.0 - 36.0 g/dL   RDW  13.1 11.5 - 15.5 %   Platelets 261 150 - 400 K/uL    ABGS  Recent Labs  06/02/15 0408  PHART 7.468*  PO2ART 67.3*  TCO2 15.9  HCO3 22.3   CULTURES Recent Results (from the past 240 hour(s))  MRSA PCR Screening     Status: None   Collection Time: 05/30/15  5:08 PM  Result Value Ref Range Status   MRSA by PCR NEGATIVE NEGATIVE Final    Comment:        The GeneXpert MRSA Assay (FDA approved for NASAL specimens only), is one component of a comprehensive MRSA colonization surveillance program. It is not intended to diagnose MRSA infection nor to guide or monitor treatment for MRSA infections.    Studies/Results: Dg Chest Port 1 View  06/01/2015   CLINICAL DATA:  Acute respiratory failure. On ventilator. ST-elevation myocardial infarction.  EXAM: PORTABLE CHEST 1 VIEW  COMPARISON:  05/31/2015  FINDINGS: Endotracheal tube and nasogastric tube remain in appropriate position.  Cardiomegaly stable. Mild diffuse interstitial infiltrates are unchanged. Decreased bibasilar atelectasis noted. Probable tiny bilateral pleural effusions again noted.  IMPRESSION: Stable cardiomegaly and mild interstitial edema pattern.  Decreased bibasilar atelectasis and probable tiny residual bilateral pleural effusions.   Electronically Signed   By: Earle Gell M.D.   On: 06/01/2015 08:03    Medications:  Prior to Admission:  Prescriptions prior to admission  Medication Sig Dispense Refill Last Dose  . aspirin EC 81 MG tablet Take 81 mg by mouth daily.   Taking  . CRESTOR 20 MG tablet TAKE 1 Tablet BY MOUTH EVERY NIGHT AT BEDTIME FOR CHOLESTEROL  1   . HYDROcodone-acetaminophen (NORCO/VICODIN) 5-325 MG tablet Take by mouth. for pain  0   . lisinopril (PRINIVIL,ZESTRIL) 5 MG tablet Take 5 mg by mouth daily.  1   . Menthol-Methyl Salicylate (MUSCLE RUB) 10-15 % CREA Apply 1 application topically as needed for muscle pain.   Taking  . nitroGLYCERIN (NITROSTAT) 0.4 MG SL tablet Place 1 tablet (0.4 mg  total) under the tongue every 5 (five) minutes as needed for chest pain. 25 tablet 3 Taking  . oxymetazoline (AFRIN) 0.05 % nasal spray Place 1 spray into both nostrils 2 (two) times daily as needed for congestion.   Taking  . rosuvastatin (CRESTOR) 10 MG tablet  Take 1 tablet (10 mg total) by mouth daily. 30 tablet 6 Taking  . ticagrelor (BRILINTA) 90 MG TABS tablet Take 1 tablet (90 mg total) by mouth 2 (two) times daily. 180 tablet 3 Taking  . [DISCONTINUED] atorvastatin (LIPITOR) 80 MG tablet TAKE 1 TABLET (80 MG TOTAL) BY MOUTH DAILY AT 6 PM.  11    Scheduled: . antiseptic oral rinse  7 mL Mouth Rinse QID  . aspirin  81 mg Per Tube Daily  . chlorhexidine gluconate  15 mL Mouth Rinse BID  . diphenhydrAMINE  25 mg Per Tube Q6H  . enoxaparin (LOVENOX) injection  40 mg Subcutaneous Q24H  . famotidine (PEPCID) IV  20 mg Intravenous Q12H  . furosemide  40 mg Intravenous Q12H  . ipratropium-albuterol  3 mL Nebulization Q4H  . methylPREDNISolone (SOLU-MEDROL) injection  80 mg Intravenous Q6H  . multivitamin  5 mL Oral Daily  . nicotine  21 mg Transdermal Daily  . rosuvastatin  20 mg Per Tube q1800  . sodium chloride  3 mL Intravenous Q12H  . ticagrelor  90 mg Per Tube BID   Continuous: . propofol (DIPRIVAN) infusion 39.9 mcg/kg/min (06/02/15 0812)   YHT:MBPJPETK (SUBLIMAZE) injection, hydrALAZINE, midazolam, ondansetron **OR** ondansetron (ZOFRAN) IV  Assesment: He was admitted with angioedema and had respiratory failure from that. I think he has COPD and based on his symptoms reported by his girlfriend I think that's pretty severe. He is clearly not going to be ready to come off the ventilator now so we need to start some tube feedings. I think he may have some element of volume overload and he is receiving diuretics to see if that will make any difference. Principal Problem:   Angioedema Active Problems:   Essential hypertension   CAD (coronary artery disease)   STEMI (ST elevation  myocardial infarction)   Tobacco use disorder   Chronic systolic CHF (congestive heart failure)    Plan: Continue current treatments. Start tube feedings. Recheck chest x-ray tomorrow. Recheck ABGs tomorrow. No weaning at this point. Monitor response to diuretics. He is clearly still critically ill and requires complex decision-making with multi-system problems    LOS: 3 days   Kaijah Abts L 06/02/2015, 8:39 AM

## 2015-06-02 NOTE — Procedures (Signed)
**Note De-Identified Dorice Stiggers Obfuscation** Extubation Procedure Note  Patient Details:   Name: SHERILL WEGENER DOB: 07/03/1966 MRN: [id number]   Airway Documentation:     Evaluation  O2 sats: stable throughout Complications: No apparent complications Patient did tolerate procedure well. Bilateral Breath Sounds: Clear, Diminished Suctioning: Oral Yes Patient self-extubated,  Patient placed on 55% VM, no stridor noted,  RRT to continue to monitor Jamey Demchak, Penni Bombard 06/02/2015, 1:33 PM

## 2015-06-02 NOTE — Progress Notes (Signed)
Ventimask in use. Dr. Sarajane Jews in to see patient. Patient continues to follow commands. Talking difficult to understand. Voice hoarse. Girlfriend and daughter at bedside. Head of bed elevated.

## 2015-06-02 NOTE — Progress Notes (Signed)
Nutrition Follow-up  DOCUMENTATION CODES:  Obesity unspecified  INTERVENTION:  D/T high propofol rate, Initiate trickle feed VHP @ 5 ml/hr via OGT   60 ml Prostat QID.    Tube feeding regimen provides 1522 kcal (114% of needs), 131 grams of protein, and 100 ml of H2O.   NUTRITION DIAGNOSIS:  Inadequate oral intake related to inability to eat as evidenced by NPO status.  GOAL:  Provide needs based on ASPEN/SCCM guidelines  MONITOR:  Vent status, TF tolerance, Diet advancement, Weight trends, Labs, I & O's  REASON FOR ASSESSMENT:  Ventilator    ASSESSMENT:  49 y.o. male admitted to ICU for oral swelling and respiratory distress. Diagnoised with angioedema due to allergic reaction to usual medication, lisinopril. Patient has previous history of neck pain, otitis media, pleurisy, CAD, sinus, MI, HTN, and tobacco and alcohol abuse.   Interval hx: 10/1. Unable to be extubated, C/S for TF  RD operating remotely. MVE/Prop rates per EMR. RD to F/U Monday  Patient is currently intubated on ventilator support MV: 11.4 L/min Temp (24hrs), Avg:98.7 F (37.1 C), Min:98.5 F (36.9 C), Max:99.2 F (37.3 C)  Propofol: 22.8 ml/hr =602 kcal/day  Diet Order:  Diet NPO time specified  Skin: Rash, Left foot and leg  Last BM:  05/30/2015  Height:  Ht Readings from Last 1 Encounters:  06/01/15 '5\' 6"'$  (1.676 m)   Weight:  Wt Readings from Last 1 Encounters:  06/02/15 215 lb 13.3 oz (97.9 kg)   Wt Readings from Last 10 Encounters:  06/02/15 215 lb 13.3 oz (97.9 kg)  05/17/15 215 lb 6.4 oz (97.705 kg)  02/16/15 199 lb (90.266 kg)  02/06/15 200 lb 13.4 oz (91.1 kg)  06/20/13 198 lb 9.6 oz (90.084 kg)  04/20/13 205 lb (92.987 kg)  08/24/11 175 lb (79.379 kg)  Dosing wt: 210 (95.45 kg)  Ideal Body Weight:  64.5 kg  BMI:  Body mass index is 34.85 kg/(m^2).  Estimated Nutritional Needs:  Kcal:  1050-1336 kcals (11-14 kcal/kg bw) Protein:  >129 g (2 g/kg IBW) Fluid:  Per  MD  EDUCATION NEEDS:  No education needs identified at this time  Burtis Junes RD, LDN Nutrition Pager: [phone] 06/02/2015 11:39 AM

## 2015-06-03 ENCOUNTER — Inpatient Hospital Stay (HOSPITAL_COMMUNITY): Payer: Self-pay

## 2015-06-03 DIAGNOSIS — F17200 Nicotine dependence, unspecified, uncomplicated: Secondary | ICD-10-CM

## 2015-06-03 LAB — BLOOD GAS, ARTERIAL
ACID-BASE EXCESS: 4.2 mmol/L — AB (ref 0.0–2.0)
Bicarbonate: 28 mEq/L — ABNORMAL HIGH (ref 20.0–24.0)
DELIVERY SYSTEMS: POSITIVE
Drawn by: 22223
EXPIRATORY PAP: 6
FIO2: 55
INSPIRATORY PAP: 12
LHR: 8 {breaths}/min
O2 Saturation: 93.5 %
PH ART: 7.448 (ref 7.350–7.450)
Patient temperature: 37
TCO2: 18.2 mmol/L (ref 0–100)
pCO2 arterial: 41.1 mmHg (ref 35.0–45.0)
pO2, Arterial: 71.7 mmHg — ABNORMAL LOW (ref 80.0–100.0)

## 2015-06-03 LAB — BASIC METABOLIC PANEL
ANION GAP: 8 (ref 5–15)
BUN: 33 mg/dL — AB (ref 6–20)
CHLORIDE: 107 mmol/L (ref 101–111)
CO2: 29 mmol/L (ref 22–32)
Calcium: 8.3 mg/dL — ABNORMAL LOW (ref 8.9–10.3)
Creatinine, Ser: 0.88 mg/dL (ref 0.61–1.24)
GFR calc Af Amer: >60 mL/min (ref >60)
GLUCOSE: 184 mg/dL — AB (ref 65–99)
POTASSIUM: 3.6 mmol/L (ref 3.5–5.1)
Sodium: 144 mmol/L (ref 135–145)

## 2015-06-03 LAB — MAGNESIUM: Magnesium: 2.7 mg/dL — ABNORMAL HIGH (ref 1.7–2.4)

## 2015-06-03 LAB — GLUCOSE, CAPILLARY
GLUCOSE-CAPILLARY: 175 mg/dL — AB (ref 65–99)
GLUCOSE-CAPILLARY: 363 mg/dL — AB (ref 65–99)
Glucose-Capillary: 204 mg/dL — ABNORMAL HIGH (ref 65–99)
Glucose-Capillary: 208 mg/dL — ABNORMAL HIGH (ref 65–99)

## 2015-06-03 MED ORDER — INSULIN ASPART 100 UNIT/ML ~~LOC~~ SOLN
0.0000 [IU] | Freq: Three times a day (TID) | SUBCUTANEOUS | Status: DC
  Administered 2015-06-03: 3 [IU] via SUBCUTANEOUS
  Administered 2015-06-03: 9 [IU] via SUBCUTANEOUS

## 2015-06-03 MED ORDER — FAMOTIDINE 20 MG PO TABS
20.0000 mg | ORAL_TABLET | Freq: Two times a day (BID) | ORAL | Status: DC
  Administered 2015-06-03 – 2015-06-04 (×3): 20 mg via ORAL
  Filled 2015-06-03 (×3): qty 1

## 2015-06-03 MED ORDER — ASPIRIN 81 MG PO CHEW
81.0000 mg | CHEWABLE_TABLET | Freq: Every day | ORAL | Status: DC
  Administered 2015-06-03 – 2015-06-04 (×2): 81 mg via ORAL
  Filled 2015-06-03: qty 1

## 2015-06-03 MED ORDER — METHYLPREDNISOLONE SODIUM SUCC 40 MG IJ SOLR
40.0000 mg | Freq: Two times a day (BID) | INTRAMUSCULAR | Status: DC
  Administered 2015-06-03 – 2015-06-04 (×2): 40 mg via INTRAVENOUS
  Filled 2015-06-03 (×2): qty 1

## 2015-06-03 MED ORDER — FUROSEMIDE 40 MG PO TABS
40.0000 mg | ORAL_TABLET | Freq: Every day | ORAL | Status: DC
  Administered 2015-06-03 – 2015-06-04 (×2): 40 mg via ORAL
  Filled 2015-06-03 (×2): qty 1

## 2015-06-03 MED ORDER — ROSUVASTATIN CALCIUM 20 MG PO TABS
20.0000 mg | ORAL_TABLET | Freq: Every day | ORAL | Status: DC
  Administered 2015-06-03: 20 mg via ORAL
  Filled 2015-06-03: qty 1

## 2015-06-03 MED ORDER — INSULIN ASPART 100 UNIT/ML ~~LOC~~ SOLN
0.0000 [IU] | Freq: Every day | SUBCUTANEOUS | Status: DC
  Administered 2015-06-03: 2 [IU] via SUBCUTANEOUS

## 2015-06-03 MED ORDER — IPRATROPIUM-ALBUTEROL 0.5-2.5 (3) MG/3ML IN SOLN
3.0000 mL | Freq: Four times a day (QID) | RESPIRATORY_TRACT | Status: DC
  Administered 2015-06-03 – 2015-06-04 (×3): 3 mL via RESPIRATORY_TRACT
  Filled 2015-06-03 (×3): qty 3

## 2015-06-03 MED ORDER — DIPHENHYDRAMINE HCL 12.5 MG/5ML PO ELIX
25.0000 mg | ORAL_SOLUTION | Freq: Two times a day (BID) | ORAL | Status: DC
  Administered 2015-06-03 – 2015-06-04 (×3): 25 mg via ORAL
  Filled 2015-06-03 (×2): qty 10

## 2015-06-03 MED ORDER — PHENOL 1.4 % MT LIQD
1.0000 | OROMUCOSAL | Status: DC | PRN
  Administered 2015-06-03: 1 via OROMUCOSAL
  Filled 2015-06-03 (×2): qty 177

## 2015-06-03 MED ORDER — TICAGRELOR 90 MG PO TABS
90.0000 mg | ORAL_TABLET | Freq: Two times a day (BID) | ORAL | Status: DC
  Administered 2015-06-03 – 2015-06-04 (×3): 90 mg via ORAL
  Filled 2015-06-03 (×7): qty 1

## 2015-06-03 NOTE — Progress Notes (Signed)
Patient ambulated around unit with O2 and assist x1 tolerated well.

## 2015-06-03 NOTE — Progress Notes (Signed)
Subjective: He self extubated yesterday and has tolerated that okay. He is on mask oxygen and his oxygen saturation is doing well. He does not complain of any swelling of his throat now.  Objective: Vital signs in last 24 hours: Temp:  [97.8 F (36.6 C)-99 F (37.2 C)] 97.8 F (36.6 C) (10/02 0739) Pulse Rate:  [55-88] 67 (10/02 0800) Resp:  [14-30] 17 (10/02 0700) BP: (114-154)/(64-91) 153/86 mmHg (10/02 0700) SpO2:  [89 %-98 %] 95 % (10/02 0800) FiO2 (%):  [55 %-60 %] 55 % (10/02 0807) Weight:  [92.3 kg (203 lb 7.8 oz)] 92.3 kg (203 lb 7.8 oz) (10/02 0500) Weight change: -5.6 kg (-12 lb 5.5 oz) Last BM Date: 05/29/15  Intake/Output from previous day: 10/01 0701 - 10/02 0700 In: 36 [IV Piggyback:50] Out: 5050 [Urine:5050]  PHYSICAL EXAM General appearance: alert, cooperative and mild distress Resp: rhonchi bilaterally Cardio: regular rate and rhythm, S1, S2 normal, no murmur, click, rub or gallop GI: soft, non-tender; bowel sounds normal; no masses,  no organomegaly Extremities: extremities normal, atraumatic, no cyanosis or edema  Lab Results:  Results for orders placed or performed during the hospital encounter of 05/30/15 (from the past 48 hour(s))  Blood gas, arterial     Status: Abnormal (Preliminary result)   Collection Time: 06/02/15  4:08 AM  Result Value Ref Range   FIO2 60.00    Delivery systems VENTILATOR    Mode PRESSURE REGULATED VOLUME CONTROL    VT 550 mL   LHR 14 resp/min   Peep/cpap 5.0 cm H20   pH, Arterial 7.468 (H) 7.350 - 7.450   pCO2 arterial 27.7 (L) 35.0 - 45.0 mmHg   pO2, Arterial 67.3 (L) 80.0 - 100.0 mmHg   Bicarbonate 22.3 20.0 - 24.0 mEq/L   TCO2 15.9 0 - 100 mmol/L   Acid-Base Excess 3.3 (H) 0.0 - 2.0 mmol/L   O2 Saturation 93.4 %   Patient temperature 37.0    Oxygen index PENDING    Collection site LEFT RADIAL    Drawn by [id number]    Sample type ARTERIAL    Allens test (pass/fail) PASS PASS   Mechanical Rate PENDING   Triglycerides      Status: Abnormal   Collection Time: 06/02/15  4:48 AM  Result Value Ref Range   Triglycerides 432 (H) <150 mg/dL  Basic metabolic panel     Status: Abnormal   Collection Time: 06/02/15  4:48 AM  Result Value Ref Range   Sodium 141 135 - 145 mmol/L   Potassium 3.6 3.5 - 5.1 mmol/L   Chloride 111 101 - 111 mmol/L   CO2 24 22 - 32 mmol/L   Glucose, Bld 195 (H) 65 - 99 mg/dL   BUN 22 (H) 6 - 20 mg/dL   Creatinine, Ser 0.83 0.61 - 1.24 mg/dL   Calcium 8.2 (L) 8.9 - 10.3 mg/dL   GFR calc non Af Amer >60 >60 mL/min   GFR calc Af Amer >60 >60 mL/min    Comment: (NOTE) The eGFR has been calculated using the CKD EPI equation. This calculation has not been validated in all clinical situations. eGFR's persistently <60 mL/min signify possible Chronic Kidney Disease.    Anion gap 6 5 - 15  CBC     Status: Abnormal   Collection Time: 06/02/15  4:48 AM  Result Value Ref Range   WBC 10.0 4.0 - 10.5 K/uL   RBC 4.30 4.22 - 5.81 MIL/uL   Hemoglobin 12.6 (L) 13.0 - 17.0 g/dL  HCT 39.3 39.0 - 52.0 %   MCV 91.4 78.0 - 100.0 fL   MCH 29.3 26.0 - 34.0 pg   MCHC 32.1 30.0 - 36.0 g/dL   RDW 13.1 11.5 - 15.5 %   Platelets 261 150 - 400 K/uL  Glucose, capillary     Status: Abnormal   Collection Time: 06/02/15  9:40 AM  Result Value Ref Range   Glucose-Capillary 204 (H) 65 - 99 mg/dL  Glucose, capillary     Status: Abnormal   Collection Time: 06/02/15 11:51 AM  Result Value Ref Range   Glucose-Capillary 177 (H) 65 - 99 mg/dL  Glucose, capillary     Status: Abnormal   Collection Time: 06/02/15  4:52 PM  Result Value Ref Range   Glucose-Capillary 169 (H) 65 - 99 mg/dL   Comment 1 Notify RN    Comment 2 Document in Chart   Glucose, capillary     Status: Abnormal   Collection Time: 06/02/15  9:23 PM  Result Value Ref Range   Glucose-Capillary 210 (H) 65 - 99 mg/dL   Comment 1 Notify RN   Blood gas, arterial     Status: Abnormal   Collection Time: 06/03/15  5:28 AM  Result Value Ref Range    FIO2 55.00    Delivery systems BILEVEL POSITIVE AIRWAY PRESSURE    LHR 8.0 resp/min   Inspiratory PAP 12    Expiratory PAP 6    pH, Arterial 7.448 7.350 - 7.450   pCO2 arterial 41.1 35.0 - 45.0 mmHg   pO2, Arterial 71.7 (L) 80.0 - 100.0 mmHg   Bicarbonate 28.0 (H) 20.0 - 24.0 mEq/L   TCO2 18.2 0 - 100 mmol/L   Acid-Base Excess 4.2 (H) 0.0 - 2.0 mmol/L   O2 Saturation 93.5 %   Patient temperature 37.0    Collection site RIGHT RADIAL    Drawn by [id number]    Sample type ARTERIAL    Allens test (pass/fail) PASS PASS  Basic metabolic panel     Status: Abnormal   Collection Time: 06/03/15  5:36 AM  Result Value Ref Range   Sodium 144 135 - 145 mmol/L   Potassium 3.6 3.5 - 5.1 mmol/L   Chloride 107 101 - 111 mmol/L   CO2 29 22 - 32 mmol/L   Glucose, Bld 184 (H) 65 - 99 mg/dL   BUN 33 (H) 6 - 20 mg/dL   Creatinine, Ser 0.88 0.61 - 1.24 mg/dL   Calcium 8.3 (L) 8.9 - 10.3 mg/dL   GFR calc non Af Amer >60 >60 mL/min   GFR calc Af Amer >60 >60 mL/min    Comment: (NOTE) The eGFR has been calculated using the CKD EPI equation. This calculation has not been validated in all clinical situations. eGFR's persistently <60 mL/min signify possible Chronic Kidney Disease.    Anion gap 8 5 - 15  Magnesium     Status: Abnormal   Collection Time: 06/03/15  5:36 AM  Result Value Ref Range   Magnesium 2.7 (H) 1.7 - 2.4 mg/dL  Glucose, capillary     Status: Abnormal   Collection Time: 06/03/15  7:32 AM  Result Value Ref Range   Glucose-Capillary 175 (H) 65 - 99 mg/dL   Comment 1 Notify RN    Comment 2 Document in Chart     ABGS  Recent Labs  06/03/15 0528  PHART 7.448  PO2ART 71.7*  TCO2 18.2  HCO3 28.0*   CULTURES Recent Results (from the past 240 hour(s))  MRSA PCR Screening     Status: None   Collection Time: 05/30/15  5:08 PM  Result Value Ref Range Status   MRSA by PCR NEGATIVE NEGATIVE Final    Comment:        The GeneXpert MRSA Assay (FDA approved for NASAL  specimens only), is one component of a comprehensive MRSA colonization surveillance program. It is not intended to diagnose MRSA infection nor to guide or monitor treatment for MRSA infections.    Studies/Results: Dg Chest Port 1 View  06/03/2015   CLINICAL DATA:  Respiratory failure  EXAM: PORTABLE CHEST 1 VIEW  COMPARISON:  06/02/2015.  FINDINGS: Cardiac enlargement. Mild improvement in interstitial lung markings which may be due to pulmonary edema or atelectasis. Bibasilar atelectasis remains. Small right pleural effusion remains.  Endotracheal tube removed.  NG removed  Right peritracheal soft tissue density is unchanged from prior studies and could represent adenopathy. CT chest recommended for further evaluation.  IMPRESSION: Endotracheal tube and NG tubes removed.  Mild improved aeration in the lungs.  Bibasilar atelectasis remains.  Right peritracheal soft tissue thickening, possible adenopathy. CT chest with contrast recommended.   Electronically Signed   By: Marlan Palau M.D.   On: 06/03/2015 09:24   Dg Chest Port 1 View  06/02/2015   CLINICAL DATA:  Respiratory failure.  Follow-up chest radiograph  EXAM: PORTABLE CHEST 1 VIEW  COMPARISON:  Radiograph 06/01/2015  FINDINGS: Endotracheal tube and NG tube are unchanged. Enlarged cardiac silhouette with ectatic aorta. Low lung volumes and central venous congestion. Lung bases are poorly evaluated. Increased basilar atelectasis.  IMPRESSION: 1. Stable support apparatus. 2. Low lung volumes and increased basilar atelectasis with central venous congestion.   Electronically Signed   By: Genevive Bi M.D.   On: 06/02/2015 08:41    Medications:  Prior to Admission:  Prescriptions prior to admission  Medication Sig Dispense Refill Last Dose  . aspirin EC 81 MG tablet Take 81 mg by mouth daily.   Taking  . CRESTOR 20 MG tablet TAKE 1 Tablet BY MOUTH EVERY NIGHT AT BEDTIME FOR CHOLESTEROL  1   . HYDROcodone-acetaminophen (NORCO/VICODIN)  5-325 MG tablet Take by mouth. for pain  0   . lisinopril (PRINIVIL,ZESTRIL) 5 MG tablet Take 5 mg by mouth daily.  1   . Menthol-Methyl Salicylate (MUSCLE RUB) 10-15 % CREA Apply 1 application topically as needed for muscle pain.   Taking  . nitroGLYCERIN (NITROSTAT) 0.4 MG SL tablet Place 1 tablet (0.4 mg total) under the tongue every 5 (five) minutes as needed for chest pain. 25 tablet 3 Taking  . oxymetazoline (AFRIN) 0.05 % nasal spray Place 1 spray into both nostrils 2 (two) times daily as needed for congestion.   Taking  . rosuvastatin (CRESTOR) 10 MG tablet Take 1 tablet (10 mg total) by mouth daily. 30 tablet 6 Taking  . ticagrelor (BRILINTA) 90 MG TABS tablet Take 1 tablet (90 mg total) by mouth 2 (two) times daily. 180 tablet 3 Taking  . [DISCONTINUED] atorvastatin (LIPITOR) 80 MG tablet TAKE 1 TABLET (80 MG TOTAL) BY MOUTH DAILY AT 6 PM.  11    Scheduled: . aspirin  81 mg Oral Daily  . chlorhexidine gluconate  15 mL Mouth Rinse BID  . diphenhydrAMINE  25 mg Oral BID  . enoxaparin (LOVENOX) injection  40 mg Subcutaneous Q24H  . famotidine  20 mg Oral BID  . feeding supplement (PRO-STAT SUGAR FREE 64)  60 mL Oral QID  . furosemide  40 mg Oral Daily  . ipratropium-albuterol  3 mL Nebulization Q4H  . methylPREDNISolone (SOLU-MEDROL) injection  40 mg Intravenous Q12H  . multivitamin  5 mL Oral Daily  . nicotine  21 mg Transdermal Daily  . rosuvastatin  20 mg Oral q1800  . sodium chloride  3 mL Intravenous Q12H  . ticagrelor  90 mg Oral BID   Continuous:  LEZ:[id number] (TYLENOL) oral liquid 160 mg/5 mL, hydrALAZINE, ondansetron **OR** ondansetron (ZOFRAN) IV, Racepinephrine HCl  Assesment: He was admitted with angioedema from ACE inhibitor and acute hypoxic respiratory failure. He has self extubated and has tolerated that okay. At baseline I think he has chronic systolic heart failure and has had a fairly recent STEMI. I think he has COPD which based on history is probably at  least moderately severe and he will need treatment for that. Principal Problem:   Angioedema Active Problems:   Essential hypertension   CAD (coronary artery disease)   STEMI (ST elevation myocardial infarction) (HCC)   Tobacco use disorder   Chronic systolic CHF (congestive heart failure) (HCC)   Acute respiratory failure with hypoxia (HCC)   Acute respiratory failure with hypoxemia (HCC)   Acute on chronic systolic heart failure (HCC)   ACE inhibitor-aggravated angioedema    Plan: Continue current treatment. No changes in medications. Taper oxygen as tolerated. Agree with starting steroid taper and with advance of diet.    LOS: 4 days   Morgana Rowley L 06/03/2015, 9:27 AM

## 2015-06-03 NOTE — Progress Notes (Signed)
PROGRESS NOTE  Randy Hebert FAO:[id number] DOB: 10/09/1965 DOA: 05/30/2015 PCP: No PCP Per Patient  Summary: 49 y/o male with a hx of HTN presented to the emergency department with complaints of difficulty breathing, difficulty swallowing water, and feeling of choking. Per patient's girlfriend, he was recently started on Lisinopril 1 month ago. While in the ED, the patient was intubated secondary to being in respiratory distress. Labs and CXR were unremarkable. He was admitted for further management.   Assessment/Plan: 1. Severe angioedema of pharynx resulting in airway compromise, presumed secondary to lisinopril. Emergently intubated 9/28. Self extubated 10/1. Overall improving.   2. Acute hypoxic/hypoxemic respiratory failure.slowly improving. Stable on veni mask. Suspect underlying COPD and component of acute CHF. 3. Acute on chronic systolic CHF.Excellent urine output. CXR shows improvement. CXR shows increased edema pattern.  4. Hypertension, stable. Continue antihypertensives. 5. CAD, s/p STEMI 01/2015, sinus pause secondary to beta-blocker. Continue dual antiplatelet therapy minimum 1 year. Continue rosuvastatin. 6. Tobacco use disorder 7. Obesity   Overall much improved  Start steroid taper, decreaseLasix, advance diet  Likely transfer to floor later today. Anticipate discharge 10/3 if able to successfully wean oxygen.   Code Status: Full DVT prophylaxis: Lovenox Family discussion: Discussed plan in detail with girlfriend and patient at bedside. No further concerns at this time. Disposition Plan: transfer to telemetry   Murray Hodgkins, MD  [facility]  Pager [phone] If 7PM-7AM, please contact night-coverage at [email or web address], password [facility] 06/03/2015, 7:08 AM  LOS: 4 days   Consultants:  Pulmonology   Nutrition  Procedures:  ETT  9/28 >>  Antibiotics:    HPI/Subjective: Night was ok. Breathing is doing better. Throat a little sore but doesn't feel  swollen.  No n/v. Mild productive cough.    Objective: Filed Vitals:   06/03/15 0358 06/03/15 0359 06/03/15 0400 06/03/15 0500  BP:  144/91 142/84 132/80  Pulse:  61 61 58  Temp:   98.3 F (36.8 C)   TempSrc:   Axillary   Resp:  '16 18 14  '$ Height:      Weight:    92.3 kg (203 lb 7.8 oz)  SpO2: 96% 96% 98% 97%    Intake/Output Summary (Last 24 hours) at 06/03/15 0708 Last data filed at 06/03/15 0500  Gross per 24 hour  Intake     50 ml  Output   5050 ml  Net  -5000 ml     Filed Weights   06/01/15 0400 06/02/15 0441 06/03/15 0500  Weight: 99.1 kg (218 lb 7.6 oz) 97.9 kg (215 lb 13.3 oz) 92.3 kg (203 lb 7.8 oz)    Exam: Not hypoxic, afebrile General:  Appears comfortable, calm. Lying in bed Eyes: PERRL, normal lids, irises ENT: grossly normal hearing, lips, tongue, oropharynx is clear, without edema. Uvula is visible. Tongue and lips unremarkable.  Neck: full but otherwise unremarkable.  Cardiovascular: Regular rate and rhythm, no murmur, rub or gallop. No lower extremity edema. Telemetry: Sinus rhythm, no arrhythmias  Respiratory: Clear to auscultation bilaterally, no wheezes, rales or rhonchi. Normal respiratory effort. Abdomen: soft, ntnd Musculoskeletal: grossly normal tone bilateral upper and lower extremities Psychiatric: grossly normal mood and affect, speech fluent and appropriate Neurologic: grossly non-focal.   New data reviewed:  Urine output 5L  CBG stable   ABG reviewed, hypoxemia improved  BMP unremarkable   CXR independently reviewed: much improved with better aeration, no infiltrate.   Pertinent data since admission:  CXR independently reviewed mild interstitial edema pattern  Pending data:  Scheduled Meds: . aspirin  81 mg Per Tube Daily  . chlorhexidine gluconate  15 mL Mouth Rinse BID  . diphenhydrAMINE  25 mg Per Tube Q6H  . enoxaparin (LOVENOX) injection  40 mg Subcutaneous Q24H  . famotidine (PEPCID) IV  20 mg Intravenous Q12H    . feeding supplement (PRO-STAT SUGAR FREE 64)  60 mL Oral QID  . ipratropium-albuterol  3 mL Nebulization Q4H  . methylPREDNISolone (SOLU-MEDROL) injection  80 mg Intravenous Q6H  . multivitamin  5 mL Oral Daily  . nicotine  21 mg Transdermal Daily  . rosuvastatin  20 mg Per Tube q1800  . sodium chloride  3 mL Intravenous Q12H  . ticagrelor  90 mg Per Tube BID   Continuous Infusions:    Principal Problem:   Angioedema Active Problems:   Essential hypertension   CAD (coronary artery disease)   STEMI (ST elevation myocardial infarction) (HCC)   Tobacco use disorder   Chronic systolic CHF (congestive heart failure) (HCC)   Acute respiratory failure with hypoxia (HCC)   Acute respiratory failure with hypoxemia (HCC)   Acute on chronic systolic heart failure ([place])   ACE inhibitor-aggravated angioedema   Time spent 25 minutes  By signing my name below, I, Arielle Khosrowpour, attest that this documentation has been prepared under the direction and in the presence of Daniel P. Sarajane Jews, MD. Electronically signed: Salvadore Oxford. 06/03/2015   I personally performed the services described in this documentation. All medical record entries made by the scribe were at my direction. I have reviewed the chart and agree that the record reflects my personal performance and is accurate and complete. Murray Hodgkins, MD

## 2015-06-04 LAB — GLUCOSE, CAPILLARY: Glucose-Capillary: 127 mg/dL — ABNORMAL HIGH (ref 65–99)

## 2015-06-04 MED ORDER — ADULT MULTIVITAMIN W/MINERALS CH: 1.0000 | ORAL_TABLET | Freq: Every day | ORAL | Status: DC

## 2015-06-04 MED ORDER — PREDNISONE 20 MG PO TABS
40.0000 mg | ORAL_TABLET | Freq: Every day | ORAL | Status: DC
  Administered 2015-06-04: 40 mg via ORAL
  Filled 2015-06-04: qty 2

## 2015-06-04 MED ORDER — DIPHENHYDRAMINE HCL 25 MG PO CAPS: 25.0000 mg | ORAL_CAPSULE | Freq: Two times a day (BID) | ORAL | Status: DC

## 2015-06-04 MED ORDER — PREDNISONE 10 MG PO TABS: ORAL_TABLET | ORAL | Status: DC

## 2015-06-04 MED ORDER — NICOTINE 21 MG/24HR TD PT24: 21.0000 mg | MEDICATED_PATCH | Freq: Every day | TRANSDERMAL | Status: DC

## 2015-06-04 MED ORDER — FAMOTIDINE 20 MG PO TABS: 20.0000 mg | ORAL_TABLET | Freq: Two times a day (BID) | ORAL | Status: DC

## 2015-06-04 NOTE — Care Management Note (Signed)
Case Management Note  Patient Details  Name: Randy Hebert MRN: [id number] Date of Birth: May 14, 1966  Expected Discharge Date:  06/02/15               Expected Discharge Plan:  Home/Self Care  In-House Referral:  Financial Counselor  Discharge planning Services  CM Consult  Post Acute Care Choice:    Choice offered to:     DME Arranged:    DME Agency:     HH Arranged:    [place]:     Status of Service:  Completed, signed off  Medicare Important Message Given:    Date Medicare IM Given:    Medicare IM give by:    Date Additional Medicare IM Given:    Additional Medicare Important Message give by:     If discussed at [place] of Stay Meetings, dates discussed:    Additional Comments: Pt now extubated and being discharged home with self care today. Pt confirms he goes to the [facility] for PCP care. Pt confirms he has no medication needs. Home O2 assessment confirms no home O2 is needed. Pt notified FC has been consulted and may be in contact with him. No further CM needs identified.  Sherald Barge, RN 06/04/2015, 10:36 AM

## 2015-06-04 NOTE — Progress Notes (Signed)
PROGRESS NOTE  Randy Hebert FMB:[id number] DOB: 01-Jan-1966 DOA: 05/30/2015 PCP: No PCP Per Patient  Summary: 49 y/o male with a hx of HTN presented to the emergency department with complaints of difficulty breathing, difficulty swallowing water, and feeling of choking. Per patient's girlfriend, he was recently started on Lisinopril 1 month ago. While in the ED, the patient was intubated secondary to being in respiratory distress. Labs and CXR were unremarkable. He was admitted for further management.   Assessment/Plan: 1. Severe angioedema of pharynx resulting in airway compromise, presumed secondary to lisinopril. Appears resolved. Emergently intubated 9/28. Self extubated 10/1. 2. Acute hypoxic/hypoxemic respiratory failure. Resolved, no hypoxia on RA. Suspect underlying COPD and component of acute CHF. Pulmonology is following and agrees with the plan.  3. Acute on chronic systolic CHF. Appears compensated. Excellent urine output. Initial CXR revealed  increased edema pattern. Repeat CXR revealed improvement.  4. Hypertension, remains stable. Will continue antihypertensives. 5. CAD, s/p STEMI 01/2015, sinus pause secondary to beta-blocker. Continue dual antiplatelet therapy minimum 1 year. Continue rosuvastatin. 6. Tobacco use disorder. Counseled on cessation.  7. Obesity. Appreciated nutrition recommendations.  8. Right peritracheal soft tissue thickening, possible adenopathy seen on CXR. Outpatient CT chest with contrast recommended for followup. 9. Suspected COPD. Recommend outpatient evaluation.    Overall much improved, plan to access for home oxygen needs.  Discharge on steroid taper, continue benadryl and Pepcid.  Recommended to follow up PFT for possible COPD and CT chest for possible adenopathy.  Discharge home today.    Code Status: Full DVT prophylaxis: Lovenox Family discussion: Discussed plan in detail with girlfriend and patient at bedside. No further concerns at  this time. Disposition Plan:Anticipate discharge home today.  Murray Hodgkins, MD  [facility]  Pager [phone] If 7PM-7AM, please contact night-coverage at [email or web address], password [facility] 06/04/2015, 9:54 AM  LOS: 5 days   Consultants:  Pulmonology   Nutrition  Procedures:  ETT  9/28 >>10/01  Antibiotics:    HPI/Subjective: Feels better. Last night was mildly paranoid, and was scared to go to sleep. Breathing is better and was able to ambulate, with weakness noted in his knees. Was able to eat without difficulty, no nausea and vomiting. No swelling.  Objective: Filed Vitals:   06/04/15 0741 06/04/15 0754 06/04/15 0800 06/04/15 0900  BP:   147/95 142/100  Pulse:   72 102  Temp:  97.5 F (36.4 C)    TempSrc:  Oral    Resp:   16 26  Height:      Weight:      SpO2: 96%  93% 96%    Intake/Output Summary (Last 24 hours) at 06/04/15 0954 Last data filed at 06/03/15 2100  Gross per 24 hour  Intake    480 ml  Output   1425 ml  Net   -945 ml     Filed Weights   06/02/15 0441 06/03/15 0500 06/04/15 0500  Weight: 97.9 kg (215 lb 13.3 oz) 92.3 kg (203 lb 7.8 oz) 93.7 kg (206 lb 9.1 oz)    Exam: Afebrile, on 4 L [place] SpO2 is stable General:  Appears comfortable, calm. Lying in bed Eyes: PERRL, normal lids, irises ENT: grossly normal hearing, lips, tongue, and oropharynx is clear. Uvula is visible and mallampati 2. Neck: full but otherwise unremarkable.   Cardiovascular: Regular rate and rhythm, no murmur, rub or gallop. No lower extremity edema. Telemetry: SR Respiratory: Clear to auscultation bilaterally, no wheezes, rales or rhonchi. Normal respiratory effort. Abdomen: soft, ntnd  Psychiatric: grossly normal mood and affect, speech fluent and appropriate   New data reviewed:  None today   Pertinent data since admission:  CXR independently reviewed mild interstitial edema pattern  Pending data:    Scheduled Meds: . aspirin  81 mg Oral Daily  .  chlorhexidine gluconate  15 mL Mouth Rinse BID  . diphenhydrAMINE  25 mg Oral BID  . enoxaparin (LOVENOX) injection  40 mg Subcutaneous Q24H  . famotidine  20 mg Oral BID  . feeding supplement (PRO-STAT SUGAR FREE 64)  60 mL Oral QID  . furosemide  40 mg Oral Daily  . insulin aspart  0-5 Units Subcutaneous QHS  . insulin aspart  0-9 Units Subcutaneous TID WC  . ipratropium-albuterol  3 mL Nebulization Q6H  . multivitamin  5 mL Oral Daily  . nicotine  21 mg Transdermal Daily  . predniSONE  40 mg Oral Q breakfast  . rosuvastatin  20 mg Oral q1800  . sodium chloride  3 mL Intravenous Q12H  . ticagrelor  90 mg Oral BID   Continuous Infusions:    Principal Problem:   Angioedema Active Problems:   Essential hypertension   CAD (coronary artery disease)   STEMI (ST elevation myocardial infarction) (HCC)   Tobacco use disorder   Chronic systolic CHF (congestive heart failure) (HCC)   Acute respiratory failure with hypoxia (HCC)   Acute respiratory failure with hypoxemia (HCC)   Acute on chronic systolic heart failure ([place])   ACE inhibitor-aggravated angioedema   By signing my name below, I, Rennis Harding attest that this documentation has been prepared under the direction and in the presence of Murray Hodgkins, MD Electronically signed: Rennis Harding 06/04/2015 8:21 AM   I personally performed the services described in this documentation. All medical record entries made by the scribe were at my direction. I have reviewed the chart and agree that the record reflects my personal performance and is accurate and complete. Murray Hodgkins, MD

## 2015-06-04 NOTE — Progress Notes (Signed)
PROGRESS NOTE  Randy Hebert VFI:[id number] DOB: 1966/05/19 DOA: 05/30/2015 PCP: No PCP Per Patient  Summary: 49 y/o male with a hx of HTN presented to the emergency department with complaints of difficulty breathing, difficulty swallowing water, and feeling of choking. Per patient's girlfriend, he was recently started on Lisinopril 1 month ago. While in the ED, the patient was intubated secondary to being in respiratory distress. Labs and CXR were unremarkable. He was admitted for further management.   Assessment/Plan: 1. Severe angioedema of pharynx resulting in airway compromise, presumed secondary to lisinopril. Appears resolved. Emergently intubated 9/28. Self extubated 10/1. 2. Acute hypoxic/hypoxemic respiratory failure. Stable on [place]. Suspect underlying COPD and component of acute CHF. Pulmonology is following and agrees with the plan.  3. Acute on chronic systolic CHF. Appears compensated. Excellent urine output. Initial CXR revealed  increased edema pattern. Repeat CXR revealed improvement.  4. Hypertension, remains stable. Will continue antihypertensives. 5. CAD, s/p STEMI 01/2015, sinus pause secondary to beta-blocker. Continue dual antiplatelet therapy minimum 1 year. Continue rosuvastatin. 6. Tobacco use disorder. Counseled on cessation.  7. Obesity. Appreciated nutrition recommendations.  8. Right peritracheal soft tissue thickening, possible adenopathy seen on CXR. Outpatient CT chest with contrast recommended for followup. 9. Suspected COPD. Recommend outpatient evaluation.    Overall much improved, plan to access for home oxygen needs.  Discharge on steroid taper, continue benadryl and Pepcid.  Recommended to follow up PFT for possible COPD and CT chest for possible adenopathy.  Discharge home today.    Code Status: Full DVT prophylaxis: Lovenox Family discussion: Discussed plan in detail with girlfriend and patient at bedside. No further concerns at this  time. Disposition Plan:Anticipate discharge home today.  Murray Hodgkins, MD  [facility]  Pager [phone] If 7PM-7AM, please contact night-coverage at [email or web address], password [facility] 06/04/2015, 8:29 AM  LOS: 5 days   Consultants:  Pulmonology   Nutrition  Procedures:  ETT  9/28 >>10/01  Antibiotics:    HPI/Subjective:  Feels better. Last night was mildly paranoid, and was scared to go to sleep. Breathing is better and was able to ambulate, with weakness noted in his knees. Was able to eat without difficulty, no nausea and vomiting. No swelling.  Objective: Filed Vitals:   06/04/15 0700 06/04/15 0741 06/04/15 0754 06/04/15 0800  BP: 137/86   147/95  Pulse: 51   72  Temp:   97.5 F (36.4 C)   TempSrc:   Oral   Resp: 14   16  Height:      Weight:      SpO2: 95% 96%  93%    Intake/Output Summary (Last 24 hours) at 06/04/15 0829 Last data filed at 06/03/15 2100  Gross per 24 hour  Intake    480 ml  Output   1425 ml  Net   -945 ml     Filed Weights   06/02/15 0441 06/03/15 0500 06/04/15 0500  Weight: 97.9 kg (215 lb 13.3 oz) 92.3 kg (203 lb 7.8 oz) 93.7 kg (206 lb 9.1 oz)    Exam: Afebrile, on 4 L [place] SpO2 is stable General:  Appears comfortable, calm. Lying in bed Eyes: PERRL, normal lids, irises ENT: grossly normal hearing, lips, tongue, and oropharynx is clear. Uvula is visible and mallampati 2. Neck: full but otherwise unremarkable.   Cardiovascular: Regular rate and rhythm, no murmur, rub or gallop. No lower extremity edema. Telemetry: SR Respiratory: Clear to auscultation bilaterally, no wheezes, rales or rhonchi. Normal respiratory effort. Abdomen: soft, ntnd Psychiatric:  grossly normal mood and affect, speech fluent and appropriate   New data reviewed:  None today   Pertinent data since admission:  CXR independently reviewed mild interstitial edema pattern  Pending data:    Scheduled Meds: . aspirin  81 mg Oral Daily  .  chlorhexidine gluconate  15 mL Mouth Rinse BID  . diphenhydrAMINE  25 mg Oral BID  . enoxaparin (LOVENOX) injection  40 mg Subcutaneous Q24H  . famotidine  20 mg Oral BID  . feeding supplement (PRO-STAT SUGAR FREE 64)  60 mL Oral QID  . furosemide  40 mg Oral Daily  . insulin aspart  0-5 Units Subcutaneous QHS  . insulin aspart  0-9 Units Subcutaneous TID WC  . ipratropium-albuterol  3 mL Nebulization Q6H  . methylPREDNISolone (SOLU-MEDROL) injection  40 mg Intravenous Q12H  . multivitamin  5 mL Oral Daily  . nicotine  21 mg Transdermal Daily  . rosuvastatin  20 mg Oral q1800  . sodium chloride  3 mL Intravenous Q12H  . ticagrelor  90 mg Oral BID   Continuous Infusions:    Principal Problem:   Angioedema Active Problems:   Essential hypertension   CAD (coronary artery disease)   STEMI (ST elevation myocardial infarction) (HCC)   Tobacco use disorder   Chronic systolic CHF (congestive heart failure) (HCC)   Acute respiratory failure with hypoxia (HCC)   Acute respiratory failure with hypoxemia (HCC)   Acute on chronic systolic heart failure ([place])   ACE inhibitor-aggravated angioedema   By signing my name below, I, Rennis Harding attest that this documentation has been prepared under the direction and in the presence of Murray Hodgkins, MD Electronically signed: Rennis Harding 06/04/2015 8:21 AM   I personally performed the services described in this documentation. All medical record entries made by the scribe were at my direction. I have reviewed the chart and agree that the record reflects my personal performance and is accurate and complete. Murray Hodgkins, MD

## 2015-06-04 NOTE — Progress Notes (Signed)
Subjective: He says he feels better but he had some problem with hallucinations last night. He says he still feels a little nervous. His breathing is doing okay and he is currently oxygenating well on nasal cannula  Objective: Vital signs in last 24 hours: Temp:  [97.5 F (36.4 C)-98.6 F (37 C)] 97.5 F (36.4 C) (10/03 0754) Pulse Rate:  [51-92] 72 (10/03 0800) Resp:  [13-23] 16 (10/03 0800) BP: (126-154)/(77-97) 147/95 mmHg (10/03 0800) SpO2:  [92 %-98 %] 93 % (10/03 0800) Weight:  [93.7 kg (206 lb 9.1 oz)] 93.7 kg (206 lb 9.1 oz) (10/03 0500) Weight change: 1.4 kg (3 lb 1.4 oz) Last BM Date: 06/03/15  Intake/Output from previous day: 10/02 0701 - 10/03 0700 In: 480 [P.O.:480] Out: 1425 [Urine:1425]  PHYSICAL EXAM General appearance: alert, cooperative and mild distress Resp: rhonchi bilaterally Cardio: regular rate and rhythm, S1, S2 normal, no murmur, click, rub or gallop GI: soft, non-tender; bowel sounds normal; no masses,  no organomegaly Extremities: extremities normal, atraumatic, no cyanosis or edema  Lab Results:  Results for orders placed or performed during the hospital encounter of 05/30/15 (from the past 48 hour(s))  Glucose, capillary     Status: Abnormal   Collection Time: 06/02/15  9:40 AM  Result Value Ref Range   Glucose-Capillary 204 (H) 65 - 99 mg/dL  Glucose, capillary     Status: Abnormal   Collection Time: 06/02/15 11:51 AM  Result Value Ref Range   Glucose-Capillary 177 (H) 65 - 99 mg/dL  Glucose, capillary     Status: Abnormal   Collection Time: 06/02/15  4:52 PM  Result Value Ref Range   Glucose-Capillary 169 (H) 65 - 99 mg/dL   Comment 1 Notify RN    Comment 2 Document in Chart   Glucose, capillary     Status: Abnormal   Collection Time: 06/02/15  9:23 PM  Result Value Ref Range   Glucose-Capillary 210 (H) 65 - 99 mg/dL   Comment 1 Notify RN   Blood gas, arterial     Status: Abnormal   Collection Time: 06/03/15  5:28 AM  Result Value  Ref Range   FIO2 55.00    Delivery systems BILEVEL POSITIVE AIRWAY PRESSURE    LHR 8.0 resp/min   Inspiratory PAP 12    Expiratory PAP 6    pH, Arterial 7.448 7.350 - 7.450   pCO2 arterial 41.1 35.0 - 45.0 mmHg   pO2, Arterial 71.7 (L) 80.0 - 100.0 mmHg   Bicarbonate 28.0 (H) 20.0 - 24.0 mEq/L   TCO2 18.2 0 - 100 mmol/L   Acid-Base Excess 4.2 (H) 0.0 - 2.0 mmol/L   O2 Saturation 93.5 %   Patient temperature 37.0    Collection site RIGHT RADIAL    Drawn by [id number]    Sample type ARTERIAL    Allens test (pass/fail) PASS PASS  Basic metabolic panel     Status: Abnormal   Collection Time: 06/03/15  5:36 AM  Result Value Ref Range   Sodium 144 135 - 145 mmol/L   Potassium 3.6 3.5 - 5.1 mmol/L   Chloride 107 101 - 111 mmol/L   CO2 29 22 - 32 mmol/L   Glucose, Bld 184 (H) 65 - 99 mg/dL   BUN 33 (H) 6 - 20 mg/dL   Creatinine, Ser 0.88 0.61 - 1.24 mg/dL   Calcium 8.3 (L) 8.9 - 10.3 mg/dL   GFR calc non Af Amer >60 >60 mL/min   GFR calc Af Amer >60 >  60 mL/min    Comment: (NOTE) The eGFR has been calculated using the CKD EPI equation. This calculation has not been validated in all clinical situations. eGFR's persistently <60 mL/min signify possible Chronic Kidney Disease.    Anion gap 8 5 - 15  Magnesium     Status: Abnormal   Collection Time: 06/03/15  5:36 AM  Result Value Ref Range   Magnesium 2.7 (H) 1.7 - 2.4 mg/dL  Glucose, capillary     Status: Abnormal   Collection Time: 06/03/15  7:32 AM  Result Value Ref Range   Glucose-Capillary 175 (H) 65 - 99 mg/dL   Comment 1 Notify RN    Comment 2 Document in Chart   Glucose, capillary     Status: Abnormal   Collection Time: 06/03/15 11:08 AM  Result Value Ref Range   Glucose-Capillary 363 (H) 65 - 99 mg/dL   Comment 1 Notify RN    Comment 2 Document in Chart   Glucose, capillary     Status: Abnormal   Collection Time: 06/03/15  4:20 PM  Result Value Ref Range   Glucose-Capillary 204 (H) 65 - 99 mg/dL   Comment 1 Notify RN     Comment 2 Document in Chart   Glucose, capillary     Status: Abnormal   Collection Time: 06/03/15  9:10 PM  Result Value Ref Range   Glucose-Capillary 208 (H) 65 - 99 mg/dL   Comment 1 Notify RN   Glucose, capillary     Status: Abnormal   Collection Time: 06/04/15  7:42 AM  Result Value Ref Range   Glucose-Capillary 127 (H) 65 - 99 mg/dL   Comment 1 Notify RN    Comment 2 Document in Chart     ABGS  Recent Labs  06/03/15 0528  PHART 7.448  PO2ART 71.7*  TCO2 18.2  HCO3 28.0*   CULTURES Recent Results (from the past 240 hour(s))  MRSA PCR Screening     Status: None   Collection Time: 05/30/15  5:08 PM  Result Value Ref Range Status   MRSA by PCR NEGATIVE NEGATIVE Final    Comment:        The GeneXpert MRSA Assay (FDA approved for NASAL specimens only), is one component of a comprehensive MRSA colonization surveillance program. It is not intended to diagnose MRSA infection nor to guide or monitor treatment for MRSA infections.    Studies/Results: Dg Chest Port 1 View  06/03/2015   CLINICAL DATA:  Respiratory failure  EXAM: PORTABLE CHEST 1 VIEW  COMPARISON:  06/02/2015.  FINDINGS: Cardiac enlargement. Mild improvement in interstitial lung markings which may be due to pulmonary edema or atelectasis. Bibasilar atelectasis remains. Small right pleural effusion remains.  Endotracheal tube removed.  NG removed  Right peritracheal soft tissue density is unchanged from prior studies and could represent adenopathy. CT chest recommended for further evaluation.  IMPRESSION: Endotracheal tube and NG tubes removed.  Mild improved aeration in the lungs.  Bibasilar atelectasis remains.  Right peritracheal soft tissue thickening, possible adenopathy. CT chest with contrast recommended.   Electronically Signed   By: Franchot Gallo M.D.   On: 06/03/2015 09:24    Medications:  Prior to Admission:  Prescriptions prior to admission  Medication Sig Dispense Refill Last Dose  . aspirin  EC 81 MG tablet Take 81 mg by mouth daily.   Taking  . CRESTOR 20 MG tablet TAKE 1 Tablet BY MOUTH EVERY NIGHT AT BEDTIME FOR CHOLESTEROL  1   . HYDROcodone-acetaminophen (  NORCO/VICODIN) 5-325 MG tablet Take by mouth. for pain  0   . lisinopril (PRINIVIL,ZESTRIL) 5 MG tablet Take 5 mg by mouth daily.  1   . Menthol-Methyl Salicylate (MUSCLE RUB) 10-15 % CREA Apply 1 application topically as needed for muscle pain.   Taking  . nitroGLYCERIN (NITROSTAT) 0.4 MG SL tablet Place 1 tablet (0.4 mg total) under the tongue every 5 (five) minutes as needed for chest pain. 25 tablet 3 Taking  . oxymetazoline (AFRIN) 0.05 % nasal spray Place 1 spray into both nostrils 2 (two) times daily as needed for congestion.   Taking  . rosuvastatin (CRESTOR) 10 MG tablet Take 1 tablet (10 mg total) by mouth daily. 30 tablet 6 Taking  . ticagrelor (BRILINTA) 90 MG TABS tablet Take 1 tablet (90 mg total) by mouth 2 (two) times daily. 180 tablet 3 Taking  . [DISCONTINUED] atorvastatin (LIPITOR) 80 MG tablet TAKE 1 TABLET (80 MG TOTAL) BY MOUTH DAILY AT 6 PM.  11    Scheduled: . aspirin  81 mg Oral Daily  . chlorhexidine gluconate  15 mL Mouth Rinse BID  . diphenhydrAMINE  25 mg Oral BID  . enoxaparin (LOVENOX) injection  40 mg Subcutaneous Q24H  . famotidine  20 mg Oral BID  . feeding supplement (PRO-STAT SUGAR FREE 64)  60 mL Oral QID  . furosemide  40 mg Oral Daily  . insulin aspart  0-5 Units Subcutaneous QHS  . insulin aspart  0-9 Units Subcutaneous TID WC  . ipratropium-albuterol  3 mL Nebulization Q6H  . multivitamin  5 mL Oral Daily  . nicotine  21 mg Transdermal Daily  . predniSONE  40 mg Oral Q breakfast  . rosuvastatin  20 mg Oral q1800  . sodium chloride  3 mL Intravenous Q12H  . ticagrelor  90 mg Oral BID   Continuous:  TFT:DDUKGURKYHCWC (TYLENOL) oral liquid 160 mg/5 mL, hydrALAZINE, ondansetron **OR** ondansetron (ZOFRAN) IV, phenol, Racepinephrine HCl  Assesment: He was admitted with angioedema  presumably secondary to lisinopril. He required intubation and mechanical ventilation and self extubated about 48 hours ago. He had acute hypoxic respiratory failure and still has hypoxia although he is requiring less oxygen. He is still a little bit hoarse which I think is a combination of his self extubation and his angioedema but his angioedema seems to have mostly resolved  He had altered mental status which could be related to alcohol withdrawal, "ICU psychosis" or related to his steroids. He is better now.  He is known to have hypertension for which she was taking the lisinopril.  He has a history of a relatively recent STEMI and has had some chronic systolic heart failure as well. Principal Problem:   Angioedema Active Problems:   Essential hypertension   CAD (coronary artery disease)   STEMI (ST elevation myocardial infarction) (HCC)   Tobacco use disorder   Chronic systolic CHF (congestive heart failure) (HCC)   Acute respiratory failure with hypoxia (HCC)   Acute respiratory failure with hypoxemia (HCC)   Acute on chronic systolic heart failure (HCC)   ACE inhibitor-aggravated angioedema    Plan: Continue current treatments.    LOS: 5 days   Marquies Wanat L 06/04/2015, 8:15 AM

## 2015-06-04 NOTE — Discharge Summary (Signed)
Physician Discharge Summary  Randy Hebert KZS:[id number] DOB: [birth year] DOA: 05/30/2015  PCP: No PCP Per Patient  Admit date: 05/30/2015 Discharge date: 06/04/2015  Recommendations for Outpatient Follow-up:   Follow-up with PCP for follow up of Lisinopril induced angioedema of pharynx and systolic CHF.    Consider outpatient CT Chest with contrast for right  peritracheal soft tissue thickening, possible adenopathy.  Consider outpatient PFT for suspected COPD.  Continue benadryl, Pepcid and steroid taper.  Continue to encourage smoking counseling.    Follow-up Information    Follow up with [facility]. Schedule an appointment as soon as possible for a visit in 2 weeks.   Contact information:   [address] [place] [phone]      Follow up with HAWKINS,EDWARD L, MD. Schedule an appointment as soon as possible for a visit in 2 weeks.   Specialty:  Pulmonary Disease   Contact information:   [address] [place] [address] [phone]       Follow up with HAWKINS,EDWARD L, MD. Go on 06/21/2015.   Specialty:  Pulmonary Disease   Why:  2 pm for follow-up    Contact information:   [place] [address] [phone]       Discharge Diagnoses:  1. Severe angioedema (secondary to lisinopril) of pharynx resulting in airway compromise. 2. Acute hypoxic/hypoxemic respiratory failure. 3. Acute on chronic systolic CHF. 4. Hypertension. 5. CAD, s/p STEMI 01/2015. 6. Tobacco use disorder.  7. Obesity.  8. Right peritracheal soft tissue thickening, possible adenopathy. 9. Suspected COPD  Discharge Condition: Improved  Disposition: Discharge home today   Diet recommendation: Heart healthy   Filed Weights   06/02/15 0441 06/03/15 0500 06/04/15 0500  Weight: 97.9 kg (215 lb 13.3 oz) 92.3 kg (203 lb 7.8 oz) 93.7 kg (206 lb 9.1 oz)    History of present illness:  49  y/o male with a hx of HTN presented to the emergency department with complaints of difficulty breathing, difficulty swallowing water, and feeling of choking. Per patient's girlfriend, he was recently started on Lisinopril 1 month ago. While in the ED, the patient was intubated secondary to being in respiratory distress. Labs and CXR were unremarkable. He was admitted for further management.   Hospital Course:  Severe angioedema of pharynx resulting in airway compromised, presumed to be secondary to lisinopril has resolved. Discontinued lisinopril with recommendations remained discontinued upon discharge. Upon admission he was intubated due to airway compromise on 09/28, and self extubated on 62/7 without complications. Treated with steroids, H1 and H2 blockers. Underlying COPD believed to be the origin of acute hypoxic/hypoxemixc respiratory failure, recommended outpatient PFT follow-up. Acute on chronic systolic CHF was revealed on initial CXR and compensated with excellent diuresis. Repeat CXR confirmed improvement. Pulmonology has consulted and agreed with the the treatment plan. Nutrition consulted patient and made recommendations for obesity and nutritional goals during hospitalization.Follow-up CT Chest was recommended for right peritracheal soft tissue thickening, possible adenopathy.  All other issues remained stable.   Individual issues as below:  1. Severe angioedema of pharynx resulting in airway compromise, presumed secondary to lisinopril. Appears resolved. Emergently intubated 9/28. Self extubated 10/1. 2. Acute hypoxic/hypoxemic respiratory failure. Resolved, no hypoxia on RA. Suspect underlying COPD and component of acute CHF. Pulmonology is following and agrees with the plan.  3. Acute on chronic systolic CHF. Appears compensated. Excellent urine output. Initial CXR revealed increased edema pattern.  Repeat CXR revealed improvement.  4. Hypertension, remains stable. Will continue  antihypertensives. 5. CAD, s/p STEMI 01/2015, sinus pause secondary to beta-blocker. Continue dual antiplatelet therapy minimum 1 year. Continue rosuvastatin. 6. Tobacco use disorder. Counseled on cessation.  7. Obesity. Appreciated nutrition recommendations.  8. Right peritracheal soft tissue thickening, possible adenopathy seen on CXR. Outpatient CT chest with contrast recommended for followup. 9. Suspected COPD. Recommend outpatient evaluation.   Consultants:  Pulmonology   Nutrition  Procedures:  ETT 9/28 >>10/01  Discharge Instructions Discharge Instructions    Activity as tolerated - No restrictions    Complete by:  As directed      Diet - low sodium heart healthy    Complete by:  As directed      Discharge instructions    Complete by:  As directed   Call your physician or seek immediate medical attention for swelling of lip, tongue, mouth, throat; shortness of breath, pain or worsening of condition. Never take lisinopril again.            Current Discharge Medication List    START taking these medications   Details  diphenhydrAMINE (BENADRYL) 25 mg capsule Take 1 capsule (25 mg total) by mouth 2 (two) times daily. Take for 5 days then stop.    famotidine (PEPCID) 20 MG tablet Take 1 tablet (20 mg total) by mouth 2 (two) times daily. Qty: 10 tablet, Refills: 0    nicotine (NICODERM CQ - DOSED IN MG/24 HOURS) 21 mg/24hr patch Place 1 patch (21 mg total) onto the skin daily. Qty: 28 patch, Refills: 0    predniSONE (DELTASONE) 10 MG tablet Take 40 mg by mouth daily for 3 days, then take 20 mg by mouth daily for 3 days, then take 10 mg by mouth daily for 3 days, then stop. Qty: 21 tablet, Refills: 0      CONTINUE these medications which have NOT CHANGED   Details  aspirin EC 81 MG tablet Take 81 mg by mouth daily.    CRESTOR 20 MG tablet TAKE 1 Tablet BY MOUTH EVERY NIGHT AT BEDTIME FOR CHOLESTEROL Refills: 1    Menthol-Methyl Salicylate (MUSCLE RUB) 10-15  % CREA Apply 1 application topically as needed for muscle pain.    nitroGLYCERIN (NITROSTAT) 0.4 MG SL tablet Place 1 tablet (0.4 mg total) under the tongue every 5 (five) minutes as needed for chest pain. Qty: 25 tablet, Refills: 3    oxymetazoline (AFRIN) 0.05 % nasal spray Place 1 spray into both nostrils 2 (two) times daily as needed for congestion.    ticagrelor (BRILINTA) 90 MG TABS tablet Take 1 tablet (90 mg total) by mouth 2 (two) times daily. Qty: 180 tablet, Refills: 3      STOP taking these medications     HYDROcodone-acetaminophen (NORCO/VICODIN) 5-325 MG tablet      lisinopril (PRINIVIL,ZESTRIL) 5 MG tablet      lisinopril (PRINIVIL,ZESTRIL) 5 MG tablet        Allergies  Allergen Reactions  . Lisinopril Shortness Of Breath    Angioedema, required intubation and mechanical ventilation  . Amoxicillin Nausea And Vomiting  . Carvedilol Other (See Comments)    Sinus pause on telemetry >3 seconds. Longest one 9 sec. No AV nodal agent    The results of significant diagnostics from this hospitalization (including imaging, microbiology, ancillary and laboratory) are listed below for reference.    Significant Diagnostic Studies: Dg Chest Port 1 View  06/03/2015   CLINICAL DATA:  Respiratory failure  EXAM: PORTABLE CHEST 1 VIEW  COMPARISON:  06/02/2015.  FINDINGS: Cardiac enlargement. Mild improvement in interstitial lung markings which may be due to pulmonary edema or atelectasis. Bibasilar atelectasis remains. Small right pleural effusion remains.  Endotracheal tube removed.  NG removed  Right peritracheal soft tissue density is unchanged from prior studies and could represent adenopathy. CT chest recommended for further evaluation.  IMPRESSION: Endotracheal tube and NG tubes removed.  Mild improved aeration in the lungs.  Bibasilar atelectasis remains.  Right peritracheal soft tissue thickening, possible adenopathy. CT chest with contrast recommended.   Electronically Signed    By: Franchot Gallo M.D.   On: 06/03/2015 09:24   Dg Chest Port 1 View  06/02/2015   CLINICAL DATA:  Respiratory failure.  Follow-up chest radiograph  EXAM: PORTABLE CHEST 1 VIEW  COMPARISON:  Radiograph 06/01/2015  FINDINGS: Endotracheal tube and NG tube are unchanged. Enlarged cardiac silhouette with ectatic aorta. Low lung volumes and central venous congestion. Lung bases are poorly evaluated. Increased basilar atelectasis.  IMPRESSION: 1. Stable support apparatus. 2. Low lung volumes and increased basilar atelectasis with central venous congestion.   Electronically Signed   By: Suzy Bouchard M.D.   On: 06/02/2015 08:41   Dg Chest Port 1 View  06/01/2015   CLINICAL DATA:  Acute respiratory failure. On ventilator. ST-elevation myocardial infarction.  EXAM: PORTABLE CHEST 1 VIEW  COMPARISON:  05/31/2015  FINDINGS: Endotracheal tube and nasogastric tube remain in appropriate position.  Cardiomegaly stable. Mild diffuse interstitial infiltrates are unchanged. Decreased bibasilar atelectasis noted. Probable tiny bilateral pleural effusions again noted.  IMPRESSION: Stable cardiomegaly and mild interstitial edema pattern.  Decreased bibasilar atelectasis and probable tiny residual bilateral pleural effusions.   Electronically Signed   By: Earle Gell M.D.   On: 06/01/2015 08:03   Dg Chest Port 1 View  05/31/2015   CLINICAL DATA:  Status post MI, current smoker.  EXAM: PORTABLE CHEST 1 VIEW  COMPARISON:  Portable chest x-ray of May 30, 2015  FINDINGS: The lungs remain mildly hypoinflated. The interstitial markings remain coarse especially in the right infrahilar region. The left hemidiaphragm is obscured. The retrocardiac region on the left remains dense. The cardiac silhouette remains enlarged. The pulmonary vascularity is slightly less engorged today.  The endotracheal tube tip lies approximately 3.2 cm above the carina. The esophagogastric tube tip projects below the inferior margin of the image.   IMPRESSION: Persistent bilateral hypoinflation. There remains bibasilar atelectasis greater on the left. Slight interval improvement in the appearance of the pulmonary interstitium since yesterday's study may reflect resolving interstitial edema.   Electronically Signed   By: David  Martinique M.D.   On: 05/31/2015 07:24   Dg Chest Port 1 View  05/30/2015   CLINICAL DATA:  Intubation.  EXAM: PORTABLE CHEST 1 VIEW  COMPARISON:  02/06/2015  FINDINGS: The endotracheal tube is 2.6 cm above the carina. The heart is enlarged. The mediastinal contours are prominent but this is due to the supine position of the patient. There is vascular congestion and streaky atelectasis. No pleural effusions or focal infiltrates.  IMPRESSION: The endotracheal tube is 2.6 cm above the carina.  Cardiac enlargement with vascular congestion and streaky atelectasis. No definite infiltrates or effusions.   Electronically Signed   By: Marijo Sanes M.D.   On: 05/30/2015 15:48    Microbiology: Recent Results (from the past 240 hour(s))  MRSA PCR Screening     Status: None   Collection Time: 05/30/15  5:08 PM  Result Value  Ref Range Status   MRSA by PCR NEGATIVE NEGATIVE Final    Comment:        The GeneXpert MRSA Assay (FDA approved for NASAL specimens only), is one component of a comprehensive MRSA colonization surveillance program. It is not intended to diagnose MRSA infection nor to guide or monitor treatment for MRSA infections.      Labs: Basic Metabolic Panel:  Recent Labs Lab 05/30/15 1443 05/31/15 0417 06/02/15 0448 06/03/15 0536  NA 137 139 141 144  K 3.8 4.2 3.6 3.6  CL 105 105 111 107  CO2 '25 24 24 29  '$ GLUCOSE 114* 192* 195* 184*  BUN 14 20 22* 33*  CREATININE 0.81 0.88 0.83 0.88  CALCIUM 8.3* 8.7* 8.2* 8.3*  MG  --   --   --  2.7*   Liver Function Tests:  Recent Labs Lab 05/30/15 1443 05/31/15 0417  AST 24 25  ALT 28 30  ALKPHOS 74 78  BILITOT 0.7 0.6  PROT 7.0 6.8  ALBUMIN 3.7 3.6    CBC:  Recent Labs Lab 05/30/15 1443 05/31/15 0417 06/02/15 0448  WBC 10.2 10.4 10.0  HGB 14.0 13.4 12.6*  HCT 42.0 40.4 39.3  MCV 88.8 89.2 91.4  PLT 236 232 261    CBG:  Recent Labs Lab 06/03/15 0732 06/03/15 1108 06/03/15 1620 06/03/15 2110 06/04/15 0742  GLUCAP 175* 363* 204* 208* 127*    Principal Problem:   Angioedema Active Problems:   Essential hypertension   CAD (coronary artery disease)   STEMI (ST elevation myocardial infarction) (HCC)   Tobacco use disorder   Chronic systolic CHF (congestive heart failure) (HCC)   Acute respiratory failure with hypoxia (HCC)   Acute respiratory failure with hypoxemia (HCC)   Acute on chronic systolic heart failure (HCC)   ACE inhibitor-aggravated angioedema   Time coordinating discharge: 35 minutes   Signed:  Murray Hodgkins, MD [facility] 06/04/2015, 7:14 AM   By signing my name below, I, Rennis Harding attest that this documentation has been prepared under the direction and in the presence of Murray Hodgkins, MD Electronically signed: Rennis Harding  06/04/2015 8:21AM.  I personally performed the services described in this documentation. All medical record entries made by the scribe were at my direction. I have reviewed the chart and agree that the record reflects my personal performance and is accurate and complete. Murray Hodgkins, MD

## 2015-06-04 NOTE — Progress Notes (Signed)
Orders received for discharge. Instructions, prescriptions, medications, and follow-up appointments discussed with pt and significant other; understanding verbalized. Pt taken out by NT via [place] with personal belongings.

## 2015-06-04 NOTE — Progress Notes (Signed)
Patient Saturations on Room Air at Rest =  96 %  Patient Saturations on Room Air while Ambulating = 92 %   

## 2015-06-06 ENCOUNTER — Ambulatory Visit: Payer: Self-pay | Admitting: Physician Assistant

## 2015-06-06 ENCOUNTER — Encounter: Payer: Self-pay | Admitting: Physician Assistant

## 2015-06-06 VITALS — BP 124/74 | HR 80 | Temp 97.7°F | Ht 65.0 in | Wt 211.0 lb

## 2015-06-06 DIAGNOSIS — I10 Essential (primary) hypertension: Secondary | ICD-10-CM

## 2015-06-06 NOTE — Progress Notes (Signed)
   BP 124/74 mmHg  Pulse 80  Temp(Src) 97.7 F (36.5 C)  Ht '5\' 5"'$  (1.651 m)  Wt 211 lb (95.709 kg)  BMI 35.11 kg/m2  SpO2 95%   Subjective:    Patient ID: Randy Hebert, male    DOB: 27-Jan-1966, 49 y.o.   MRN: [id number]  HPI: Randy Hebert is a 49 y.o. male presenting on 06/06/2015 for Follow-up   HPI   Hospital records reviewed from recent admission Pt not scheduled to return to cards for another 4 mo or so  Chief Complaint  Patient presents with  . Follow-up    pt went to [facility] for an allergic reaction to lisinopril. pt wants to know what other med he is to take in place of the lisinopril. pt states his throat is sore, and has a little trouble breathing, and wants to see if he can get something to help with the soreness. pt states he is a little sob now, pt thinks it is due to his throat being sore and maybe a little swollen from him pulling out his trach tube.    Relevant past medical, surgical, family and social history reviewed and updated as indicated. Interim medical history since our last visit reviewed. Allergies and medications reviewed and updated.  Review of Systems  Per HPI unless specifically indicated above     Objective:    BP 124/74 mmHg  Pulse 80  Temp(Src) 97.7 F (36.5 C)  Ht '5\' 5"'$  (1.651 m)  Wt 211 lb (95.709 kg)  BMI 35.11 kg/m2  SpO2 95%  Wt Readings from Last 3 Encounters:  06/06/15 211 lb (95.709 kg)  06/04/15 206 lb 9.1 oz (93.7 kg)  05/17/15 215 lb 6.4 oz (97.705 kg)    Physical Exam  Constitutional: He is oriented to person, place, and time. He appears well-developed and well-nourished.  HENT:  Head: Normocephalic and atraumatic.  Neck: Neck supple.  Cardiovascular: Normal rate and regular rhythm.   Pulmonary/Chest: Effort normal and breath sounds normal. He has no wheezes.  Abdominal: Soft. Bowel sounds are normal. There is no tenderness.  Musculoskeletal: He exhibits no edema.  Lymphadenopathy:    He has no cervical  adenopathy.  Neurological: He is alert and oriented to person, place, and time.  Skin: Skin is warm and dry.  Psychiatric: He has a normal mood and affect. His behavior is normal.  Vitals reviewed.       Assessment & Plan:   Encounter Diagnosis  Name Primary?  . Essential hypertension Yes     -pt to call cards for appt- ? Replacement for ACEI -F/u here nov as scheduled

## 2015-06-06 NOTE — Progress Notes (Signed)
A whole bag of Fentanyl 2574mg/250 mL was wasted with LLoni Muse RN.  GNoreene Larsson10/12/2014 3:49 PM

## 2015-06-07 ENCOUNTER — Ambulatory Visit (INDEPENDENT_AMBULATORY_CARE_PROVIDER_SITE_OTHER): Payer: Self-pay | Admitting: Adult Health

## 2015-06-07 ENCOUNTER — Encounter: Payer: Self-pay | Admitting: Adult Health

## 2015-06-07 VITALS — BP 110/68 | HR 99 | Ht 65.0 in | Wt 210.0 lb

## 2015-06-07 DIAGNOSIS — T783XXD Angioneurotic edema, subsequent encounter: Secondary | ICD-10-CM

## 2015-06-07 DIAGNOSIS — I2510 Atherosclerotic heart disease of native coronary artery without angina pectoris: Secondary | ICD-10-CM

## 2015-06-07 DIAGNOSIS — Z720 Tobacco use: Secondary | ICD-10-CM

## 2015-06-07 DIAGNOSIS — S1983XA Other specified injuries of vocal cord, initial encounter: Secondary | ICD-10-CM

## 2015-06-07 NOTE — Progress Notes (Deleted)
Name: Randy Hebert    DOB: 06-08-1966  Age: 49 y.o.  MR#: [id number]       PCP:  Soyla Dryer, PA-C      Insurance: Payor: MEDICAID POTENTIAL / Plan: MEDICAID POTENTIAL / Product Type: *No Product type* /   CC:   No chief complaint on file.   VS Filed Vitals:   06/07/15 1445  BP: 110/68  Pulse: 99  Height: '5\' 5"'$  (1.651 m)  Weight: 210 lb (95.255 kg)  SpO2: 94%    Weights Current Weight  06/07/15 210 lb (95.255 kg)  06/06/15 211 lb (95.709 kg)  06/04/15 206 lb 9.1 oz (93.7 kg)    Blood Pressure  BP Readings from Last 3 Encounters:  06/07/15 110/68  06/06/15 124/74  06/04/15 142/100     Admit date:  (Not on file) Last encounter with RMR:  05/17/2015   Allergy Lisinopril; Amoxicillin; and Carvedilol  Current Outpatient Prescriptions  Medication Sig Dispense Refill  . aspirin EC 81 MG tablet Take 81 mg by mouth daily.    . CRESTOR 20 MG tablet TAKE 1 Tablet BY MOUTH EVERY NIGHT AT BEDTIME FOR CHOLESTEROL  1  . diphenhydrAMINE (BENADRYL) 25 mg capsule Take 1 capsule (25 mg total) by mouth 2 (two) times daily. Take for 5 days then stop.    . famotidine (PEPCID) 20 MG tablet Take 1 tablet (20 mg total) by mouth 2 (two) times daily. 10 tablet 0  . fluticasone (FLONASE) 50 MCG/ACT nasal spray Place 1 spray into both nostrils daily.    . Menthol-Methyl Salicylate (MUSCLE RUB) 10-15 % CREA Apply 1 application topically as needed for muscle pain.    . nicotine (NICODERM CQ - DOSED IN MG/24 HOURS) 21 mg/24hr patch Place 1 patch (21 mg total) onto the skin daily. 28 patch 0  . nitroGLYCERIN (NITROSTAT) 0.4 MG SL tablet Place 1 tablet (0.4 mg total) under the tongue every 5 (five) minutes as needed for chest pain. 25 tablet 3  . oxymetazoline (AFRIN) 0.05 % nasal spray Place 1 spray into both nostrils 2 (two) times daily as needed for congestion.    . predniSONE (DELTASONE) 10 MG tablet Take 40 mg by mouth daily for 3 days, then take 20 mg by mouth daily for 3 days, then take 10 mg  by mouth daily for 3 days, then stop. 21 tablet 0  . ticagrelor (BRILINTA) 90 MG TABS tablet Take 1 tablet (90 mg total) by mouth 2 (two) times daily. 180 tablet 3   No current facility-administered medications for this visit.    Discontinued Meds:   There are no discontinued medications.  Patient Active Problem List   Diagnosis Date Noted  . Acute respiratory failure with hypoxia ([place]) 06/02/2015  . Acute respiratory failure with hypoxemia ([place]) 06/02/2015  . Acute on chronic systolic heart failure ([place]) 06/02/2015  . ACE inhibitor-aggravated angioedema   . Chronic systolic CHF (congestive heart failure) ([place]) 06/01/2015  . CAD (coronary artery disease) 05/31/2015  . STEMI (ST elevation myocardial infarction) ([place]) 05/31/2015  . Tobacco use disorder 05/31/2015  . Angioedema 05/30/2015  . Sinus pause secondary to beta blocker 02/09/2015  . Essential hypertension 02/09/2015  . ST elevation myocardial infarction (STEMI) of lateral wall, initial episode of care ([place]) 02/06/2015    Class: Hospitalized for    LABS    Component Value Date/Time   NA 144 06/03/2015 0536   NA 141 06/02/2015 0448   NA 139 05/31/2015 0417   K 3.6 06/03/2015  0536   K 3.6 06/02/2015 0448   K 4.2 05/31/2015 0417   CL 107 06/03/2015 0536   CL 111 06/02/2015 0448   CL 105 05/31/2015 0417   CO2 29 06/03/2015 0536   CO2 24 06/02/2015 0448   CO2 24 05/31/2015 0417   GLUCOSE 184* 06/03/2015 0536   GLUCOSE 195* 06/02/2015 0448   GLUCOSE 192* 05/31/2015 0417   BUN 33* 06/03/2015 0536   BUN 22* 06/02/2015 0448   BUN 20 05/31/2015 0417   CREATININE 0.88 06/03/2015 0536   CREATININE 0.83 06/02/2015 0448   CREATININE 0.88 05/31/2015 0417   CALCIUM 8.3* 06/03/2015 0536   CALCIUM 8.2* 06/02/2015 0448   CALCIUM 8.7* 05/31/2015 0417   GFRNONAA >60 06/03/2015 0536   GFRNONAA >60 06/02/2015 0448   GFRNONAA >60 05/31/2015 0417   GFRAA >60 06/03/2015 0536   GFRAA >60 06/02/2015 0448   GFRAA >60 05/31/2015  0417   CMP     Component Value Date/Time   NA 144 06/03/2015 0536   K 3.6 06/03/2015 0536   CL 107 06/03/2015 0536   CO2 29 06/03/2015 0536   GLUCOSE 184* 06/03/2015 0536   BUN 33* 06/03/2015 0536   CREATININE 0.88 06/03/2015 0536   CALCIUM 8.3* 06/03/2015 0536   PROT 6.8 05/31/2015 0417   ALBUMIN 3.6 05/31/2015 0417   AST 25 05/31/2015 0417   ALT 30 05/31/2015 0417   ALKPHOS 78 05/31/2015 0417   BILITOT 0.6 05/31/2015 0417   GFRNONAA >60 06/03/2015 0536   GFRAA >60 06/03/2015 0536       Component Value Date/Time   WBC 10.0 06/02/2015 0448   WBC 10.4 05/31/2015 0417   WBC 10.2 05/30/2015 1443   HGB 12.6* 06/02/2015 0448   HGB 13.4 05/31/2015 0417   HGB 14.0 05/30/2015 1443   HCT 39.3 06/02/2015 0448   HCT 40.4 05/31/2015 0417   HCT 42.0 05/30/2015 1443   MCV 91.4 06/02/2015 0448   MCV 89.2 05/31/2015 0417   MCV 88.8 05/30/2015 1443    Lipid Panel     Component Value Date/Time   TRIG 432* 06/02/2015 0448    ABG    Component Value Date/Time   PHART 7.448 06/03/2015 0528   PCO2ART 41.1 06/03/2015 0528   PO2ART 71.7* 06/03/2015 0528   HCO3 28.0* 06/03/2015 0528   TCO2 18.2 06/03/2015 0528   O2SAT 93.5 06/03/2015 0528     No results found for: TSH BNP (last 3 results) No results for input(s): BNP in the last 8760 hours.  ProBNP (last 3 results) No results for input(s): PROBNP in the last 8760 hours.  Cardiac Panel (last 3 results) No results for input(s): CKTOTAL, CKMB, TROPONINI, RELINDX in the last 72 hours.  Iron/TIBC/Ferritin/ %Sat No results found for: IRON, TIBC, FERRITIN, IRONPCTSAT   EKG Orders placed or performed during the hospital encounter of 02/06/15  . EKG 12-Lead  . EKG 12-Lead  . ED EKG  . ED EKG  . EKG 12-Lead  . EKG 12-Lead  . Repeat EKG  . Repeat EKG  . EKG 12-Lead immediately post procedure  . EKG 12-Lead  . EKG 12-Lead immediately post procedure  . EKG  . EKG 12-Lead  . EKG 12-Lead  . EKG 12-Lead     Prior  Assessment and Plan Problem List as of 06/07/2015      Cardiovascular and Mediastinum   ST elevation myocardial infarction (STEMI) of lateral wall, initial episode of care [facility])   Sinus pause secondary to beta blocker  Essential hypertension   CAD (coronary artery disease)   STEMI (ST elevation myocardial infarction) (H[facility])   Chronic systolic CHF (congestive heart failure) (H[facility])   Acute on chronic systolic heart failure (H[facility])     Respiratory   Acute respiratory failure with hypoxia (H[facility])   Acute respiratory failure with hypoxemia (H[facility])     Musculoskeletal and Integument   ACE inhibitor-aggravated angioedema     Other   Angioedema   Tobacco use disorder       Imaging: Dg Chest Port 1 View  06/03/2015   CLINICAL DATA:  Respiratory failure  EXAM: PORTABLE CHEST 1 VIEW  COMPARISON:  06/02/2015.  FINDINGS: Cardiac enlargement. Mild improvement in interstitial lung markings which may be due to pulmonary edema or atelectasis. Bibasilar atelectasis remains. Small right pleural effusion remains.  Endotracheal tube removed.  NG removed  Right peritracheal soft tissue density is unchanged from prior studies and could represent adenopathy. CT chest recommended for further evaluation.  IMPRESSION: Endotracheal tube and NG tubes removed.  Mild improved aeration in the lungs.  Bibasilar atelectasis remains.  Right peritracheal soft tissue thickening, possible adenopathy. CT chest with contrast recommended.   Electronically Signed   By: Franchot Gallo M.D.   On: 06/03/2015 09:24   Dg Chest Port 1 View  06/02/2015   CLINICAL DATA:  Respiratory failure.  Follow-up chest radiograph  EXAM: PORTABLE CHEST 1 VIEW  COMPARISON:  Radiograph 06/01/2015  FINDINGS: Endotracheal tube and NG tube are unchanged. Enlarged cardiac silhouette with ectatic aorta. Low lung volumes and central venous congestion. Lung bases are poorly evaluated. Increased basilar atelectasis.  IMPRESSION: 1. Stable support apparatus. 2. Low  lung volumes and increased basilar atelectasis with central venous congestion.   Electronically Signed   By: Suzy Bouchard M.D.   On: 06/02/2015 08:41   Dg Chest Port 1 View  06/01/2015   CLINICAL DATA:  Acute respiratory failure. On ventilator. ST-elevation myocardial infarction.  EXAM: PORTABLE CHEST 1 VIEW  COMPARISON:  05/31/2015  FINDINGS: Endotracheal tube and nasogastric tube remain in appropriate position.  Cardiomegaly stable. Mild diffuse interstitial infiltrates are unchanged. Decreased bibasilar atelectasis noted. Probable tiny bilateral pleural effusions again noted.  IMPRESSION: Stable cardiomegaly and mild interstitial edema pattern.  Decreased bibasilar atelectasis and probable tiny residual bilateral pleural effusions.   Electronically Signed   By: Earle Gell M.D.   On: 06/01/2015 08:03   Dg Chest Port 1 View  05/31/2015   CLINICAL DATA:  Status post MI, current smoker.  EXAM: PORTABLE CHEST 1 VIEW  COMPARISON:  Portable chest x-ray of May 30, 2015  FINDINGS: The lungs remain mildly hypoinflated. The interstitial markings remain coarse especially in the right infrahilar region. The left hemidiaphragm is obscured. The retrocardiac region on the left remains dense. The cardiac silhouette remains enlarged. The pulmonary vascularity is slightly less engorged today.  The endotracheal tube tip lies approximately 3.2 cm above the carina. The esophagogastric tube tip projects below the inferior margin of the image.  IMPRESSION: Persistent bilateral hypoinflation. There remains bibasilar atelectasis greater on the left. Slight interval improvement in the appearance of the pulmonary interstitium since yesterday's study may reflect resolving interstitial edema.   Electronically Signed   By: David  Martinique M.D.   On: 05/31/2015 07:24   Dg Chest Port 1 View  05/30/2015   CLINICAL DATA:  Intubation.  EXAM: PORTABLE CHEST 1 VIEW  COMPARISON:  02/06/2015  FINDINGS: The endotracheal tube is 2.6 cm  above the carina. The heart is enlarged.  The mediastinal contours are prominent but this is due to the supine position of the patient. There is vascular congestion and streaky atelectasis. No pleural effusions or focal infiltrates.  IMPRESSION: The endotracheal tube is 2.6 cm above the carina.  Cardiac enlargement with vascular congestion and streaky atelectasis. No definite infiltrates or effusions.   Electronically Signed   By: Marijo Sanes M.D.   On: 05/30/2015 15:48

## 2015-06-07 NOTE — Progress Notes (Signed)
Cardiology Office Note   Date:  06/07/2015   ID:  [name], DOB 1966-07-08, MRN [id number]  PCP:  Soyla Dryer, PA-C  Cardiologist:  Jory Sims, NP   Chief Complaint  Patient presents with  . Angioedema  . Congestive Heart Failure  . Coronary Artery Disease      History of Present Illness: RUSELL Hebert is a 49 y.o. male who presents for ongoing assessment and management of CAD, with recent hospitalization in the setting of ST elevation MI in the lateral wall, hypertension, and sinus pause, secondary to beta blocker. Cardiac catheterization, which revealed a first diagonal lesion of 100% stenosis, and was provided with a promise per meter 2.5 x 16 mm drug-eluting stent. The patient had a ramus lesion of 70%, mid RCA lesion 40% stenosis distal RCA lesion 45% stenosis. Mild to moderately reduced LVEF with anterior, lateral and apical hypokinesis with an elevated LVEDP.He was last in the office on 05/17/2015.  Was pain-free.  He was given smoking cessation counseling.  Unfortunately, the patient was admitted to the hospital with severe angioedema of the pharynx, resulting airway compromise, presumed secondary to lisinopril.  The patient required intubation, but self extubated 2 days after admission.  The patient also had evidence of acute systolic heart failure.   He comes today very hoarse, still with a sore throat.  He has not been seen by his PCP at or by ENT concerning traumatic extubation.  He unfortunately continues to smoke.  Echo 01/2015 Left ventricle: Moderately severe hypokinesis of mid anterolateral segment, apical lateral segment, mid/apical anterior segments. EF is 40%. The cavity size was normal. Wall thickness was increased in a pattern of mild LVH. - Aortic valve: Sclerosis without stenosis. There was no significant regurgitation. - Right ventricle: The cavity size was normal. Systolic function was normal.  Past Medical History   Diagnosis Date  . Neck pain   . Otitis media   . Pleurisy   . CAD (coronary artery disease)     lateral STEMI 02/06/2015 00% D1 occlusion treated with Promus Premier 2.5 mm x 16 mm DES, 70% ramus stenosis, 40% mid RCA stenosis, 45% distal RCA stenosis, EF 45-50%  . Sinus pause     9 sec sinus pause on telemetry after started on coreg after MI, avoid AV nodal blocking agent  . MI (myocardial infarction) [facility])     Past Surgical History  Procedure Laterality Date  . Incision / drainage hand / finger    . Cardiac catheterization N/A 02/06/2015    Procedure: Left Heart Cath and Coronary Angiography;  Surgeon: Leonie Man, MD;  Location: [facility];  Service: Cardiovascular;  Laterality: N/A;  . Cardiac catheterization N/A 02/06/2015    Procedure: Coronary Stent Intervention;  Surgeon: Leonie Man, MD;  Location: [place] [facility];  Service: Cardiovascular;  Laterality: N/A;     Current Outpatient Prescriptions  Medication Sig Dispense Refill  . aspirin EC 81 MG tablet Take 81 mg by mouth daily.    . CRESTOR 20 MG tablet TAKE 1 Tablet BY MOUTH EVERY NIGHT AT BEDTIME FOR CHOLESTEROL  1  . diphenhydrAMINE (BENADRYL) 25 mg capsule Take 1 capsule (25 mg total) by mouth 2 (two) times daily. Take for 5 days then stop.    . famotidine (PEPCID) 20 MG tablet Take 1 tablet (20 mg total) by mouth 2 (two) times daily. 10 tablet 0  . fluticasone (FLONASE) 50 MCG/ACT nasal spray Place 1 spray into both nostrils  daily.    . Menthol-Methyl Salicylate (MUSCLE RUB) 10-15 % CREA Apply 1 application topically as needed for muscle pain.    . nicotine (NICODERM CQ - DOSED IN MG/24 HOURS) 21 mg/24hr patch Place 1 patch (21 mg total) onto the skin daily. 28 patch 0  . nitroGLYCERIN (NITROSTAT) 0.4 MG SL tablet Place 1 tablet (0.4 mg total) under the tongue every 5 (five) minutes as needed for chest pain. 25 tablet 3  . oxymetazoline (AFRIN) 0.05 % nasal spray Place 1 spray into both nostrils 2 (two)  times daily as needed for congestion.    . predniSONE (DELTASONE) 10 MG tablet Take 40 mg by mouth daily for 3 days, then take 20 mg by mouth daily for 3 days, then take 10 mg by mouth daily for 3 days, then stop. 21 tablet 0  . ticagrelor (BRILINTA) 90 MG TABS tablet Take 1 tablet (90 mg total) by mouth 2 (two) times daily. 180 tablet 3   No current facility-administered medications for this visit.    Allergies:   Lisinopril; Amoxicillin; and Carvedilol    Social History:  The patient  reports that he has been smoking Cigarettes.  He started smoking about 36 years ago. He has a 23 pack-year smoking history. He has quit using smokeless tobacco. His smokeless tobacco use included Chew. He reports that he drinks about 3.0 - 3.6 oz of alcohol per week. He reports that he does not use illicit drugs.   Family History:  The patient's family history includes Heart attack in his father; Stroke in his father.    ROS: All other systems are reviewed and negative. Unless otherwise mentioned in H&P    PHYSICAL EXAM: VS:  BP 110/68 mmHg  Pulse 99  Ht '5\' 5"'$  (1.651 m)  Wt 210 lb (95.255 kg)  BMI 34.95 kg/m2  SpO2 94% , BMI Body mass index is 34.95 kg/(m^2). GEN: Well nourished, well developed, in no acute distress HEENT: normal Neck: no JVD, carotid bruits, or masses Cardiac: RRR;slightly tachycardic,  no murmurs, rubs, or gallops,no edema  Respiratory:  clear to auscultation bilaterally, normal work of breathing GI: soft, nontender, nondistended, + BS MS: no deformity or atrophy Skin: warm and dry, no rash Neuro:  Strength and sensation are intact Psych: euthymic mood, full affect   Recent Labs: 05/31/2015: ALT 30 06/02/2015: Hemoglobin 12.6*; Platelets 261 06/03/2015: BUN 33*; Creatinine, Ser 0.88; Magnesium 2.7*; Potassium 3.6; Sodium 144    Lipid Panel    Component Value Date/Time   TRIG 432* 06/02/2015 0448      Wt Readings from Last 3 Encounters:  06/07/15 210 lb (95.255 kg)   06/06/15 211 lb (95.709 kg)  06/04/15 206 lb 9.1 oz (93.7 kg)      ASSESSMENT AND PLAN:  1. Coronary artery disease: History of ST elevation MI,he will continue on aspirin, statin therapy, and Brilinta.  He denies any recurrent chest pain, I need to use nitroglycerin.  He is advised to continue a walking program.  In order to increase his stamina and lose weight and assist with improved cardiac status.  2. ACE inhibitor-induced angioedema: the patient was recently hospitalized with acute angioedema requiring ventilator dependent support.  He unfortunately, self extubated, and now has a sore throat, and severe hoarseness. ACE inhibitors have been added to his allergy list.  I would recommend that he be followed by an ENT for more visualization of his throat to evaluate tearing or scar tissue, or issues with swallowing as he  states he continues to have this..  I will defer to his PCP for referral. He is self treating this with shots of tequila.  I have advised him to follow the advice of his primary care physician, and not to overuse alcohol.  3. Ongoing tobacco abuse:he has cut down to a half a pack a day.  He has a prescription for Nicoderm patches, but has not filled them yet.     Current medicines are reviewed at length with the patient today.    Labs/ tests ordered today include: None  Orders Placed This Encounter  Procedures  . Ambulatory referral to ENT     Disposition:   FU with one month.  Signed, Jory Sims, NP  06/07/2015 3:48 PM    [facility] [address] [place], [place] [address] Phone: [phone]; Fax: [phone]

## 2015-06-07 NOTE — Patient Instructions (Signed)
Your physician recommends that you schedule a follow-up appointment in: 1 month with Arnold Long NP     Your physician recommends that you continue on your current medications as directed. Please refer to the Current Medication list given to you today.       You have been referred to ENT to evaluate your vocal cords    Thank you for choosing [facility] !

## 2015-06-11 ENCOUNTER — Telehealth: Payer: Self-pay | Admitting: Adult Health

## 2015-06-11 NOTE — Telephone Encounter (Signed)
[facility] with [facility] at [facility] for more info

## 2015-06-11 NOTE — Telephone Encounter (Signed)
[facility] states that [facility] needs paperwork from [facility] Bears to refer patient to ENT. Please call with any questions. / tg

## 2015-06-12 NOTE — Telephone Encounter (Signed)
Through [name] expressed that I already placed referral to ENT and she soul fax again [name]'s note as to why pt needs ENT

## 2015-06-18 ENCOUNTER — Telehealth: Payer: Self-pay | Admitting: Adult Health

## 2015-06-18 NOTE — Telephone Encounter (Signed)
Pt would like to know when he is able to return to work

## 2015-06-18 NOTE — Telephone Encounter (Signed)
He can return to work now if he is feeling better. His main issue was the traumatic extubation with sore throat and hoarseness from respiratory failure with ACE. [facility] is [facility].

## 2015-06-18 NOTE — Telephone Encounter (Signed)
Will forward to provider  

## 2015-06-18 NOTE — Telephone Encounter (Signed)
Called Randy Hebert and advised him that he can return to work if he is feeling better. He said he wanted to wait a couple more weeks ( Oct. 31st) I wrote him a note to return to work, and let him know to call us back if he has any more problems.

## 2015-06-27 ENCOUNTER — Encounter: Payer: Self-pay | Admitting: Physician Assistant

## 2015-06-27 ENCOUNTER — Ambulatory Visit: Payer: Self-pay | Admitting: Physician Assistant

## 2015-06-27 VITALS — BP 150/94 | HR 102 | Temp 98.8°F | Wt 218.6 lb

## 2015-06-27 DIAGNOSIS — F411 Generalized anxiety disorder: Secondary | ICD-10-CM

## 2015-06-27 DIAGNOSIS — I10 Essential (primary) hypertension: Secondary | ICD-10-CM

## 2015-06-27 MED ORDER — AMLODIPINE BESYLATE 5 MG PO TABS: 5.0000 mg | ORAL_TABLET | Freq: Every day | ORAL | Status: DC

## 2015-06-27 NOTE — Progress Notes (Signed)
BP 150/94 mmHg  Pulse 102  Temp(Src) 98.8 F (37.1 C)  Wt 218 lb 9.6 oz (99.156 kg)  SpO2 95%   Subjective:    Patient ID: Randy Hebert, male    DOB: 1966/07/12, 49 y.o.   MRN: [id number]  HPI: Randy Hebert is a 49 y.o. male presenting on 06/27/2015 for Medication Problem   HPI   -Pt has taken no meds in last 2 days.  He says he has felt very panicky since Saturday.  Also he said he was having dreams that were waking him.   -Pt was sent to cardiology after last OV so they could determine what pt needed to be on for his blood pressure in light of his allergies to lisinopril and carvedilol in setting of CAD and CHF.  However, review of the office note looks like this problem was not addressed at all.   -Pt still smoking  Relevant past medical, surgical, family and social history reviewed and updated as indicated. Interim medical history since our last visit reviewed. Allergies and medications reviewed and updated.  Review of Systems  Constitutional: Positive for fatigue. Negative for fever, chills, diaphoresis, appetite change and unexpected weight change.  HENT: Positive for congestion, drooling and voice change. Negative for dental problem, ear pain, facial swelling, hearing loss, mouth sores, sneezing, sore throat and trouble swallowing.   Eyes: Positive for visual disturbance. Negative for pain, discharge, redness and itching.  Respiratory: Positive for shortness of breath and wheezing. Negative for cough and choking.   Cardiovascular: Negative for chest pain, palpitations and leg swelling.  Gastrointestinal: Negative for vomiting, abdominal pain, diarrhea, constipation and blood in stool.  Endocrine: Positive for heat intolerance. Negative for cold intolerance and polydipsia.  Genitourinary: Negative for dysuria, hematuria and decreased urine volume.  Musculoskeletal: Positive for back pain. Negative for arthralgias and gait problem.  Skin: Negative for rash.   Allergic/Immunologic: Negative for environmental allergies.  Neurological: Positive for light-headedness. Negative for seizures, syncope and headaches.  Hematological: Negative for adenopathy.  Psychiatric/Behavioral: Positive for agitation. Negative for suicidal ideas and dysphoric mood. The patient is nervous/anxious.     Per HPI unless specifically indicated above     Objective:    BP 150/94 mmHg  Pulse 102  Temp(Src) 98.8 F (37.1 C)  Wt 218 lb 9.6 oz (99.156 kg)  SpO2 95%  Wt Readings from Last 3 Encounters:  06/27/15 218 lb 9.6 oz (99.156 kg)  06/07/15 210 lb (95.255 kg)  06/06/15 211 lb (95.709 kg)    Physical Exam  Constitutional: He is oriented to person, place, and time. He appears well-developed and well-nourished.  HENT:  Head: Normocephalic and atraumatic.  Voice mildly hoarse  Neck: Neck supple.  Cardiovascular: Normal rate and regular rhythm.   Pulmonary/Chest: Effort normal and breath sounds normal. He has no wheezes.  Abdominal: Soft. Bowel sounds are normal. There is no tenderness.  Lymphadenopathy:    He has no cervical adenopathy.  Neurological: He is alert and oriented to person, place, and time.  Skin: Skin is warm and dry.  Psychiatric: He has a normal mood and affect. His behavior is normal.  Vitals reviewed.      Assessment & Plan:   Encounter Diagnoses  Name Primary?  Marland Kitchen Anxiety state Yes  . Essential hypertension      -Pt has appt with ENT in December (dr Melene Plan) -rx amlodipine for bp.  Pt could benefit from beta-blocker with pulse mildly fast but with pt previous reaction  to carvedilol, will avoid this category or medication. -Pt counseled to restart his routine meds.   -Gave card to pt for cardinal(previousely [facility]) - he is to call to get appt for  counseling/eval for anxiolytics -Pt had normal TSH in June 2016 but will recheck it today -F/u here nov as scheduled

## 2015-06-28 LAB — TSH: TSH: 2.564 u[IU]/mL (ref 0.350–4.500)

## 2015-07-09 ENCOUNTER — Emergency Department (HOSPITAL_COMMUNITY): Payer: Self-pay

## 2015-07-09 ENCOUNTER — Emergency Department (HOSPITAL_COMMUNITY)
Admission: EM | Admit: 2015-07-09 | Discharge: 2015-07-09 | Disposition: A | Discharge status: Home / Self Care | Payer: Self-pay | Attending: Emergency Medicine | Admitting: Emergency Medicine

## 2015-07-09 ENCOUNTER — Encounter (HOSPITAL_COMMUNITY): Payer: Self-pay | Admitting: Emergency Medicine

## 2015-07-09 ENCOUNTER — Encounter: Payer: Self-pay | Admitting: Adult Health

## 2015-07-09 DIAGNOSIS — F419 Anxiety disorder, unspecified: Secondary | ICD-10-CM | POA: Insufficient documentation

## 2015-07-09 DIAGNOSIS — I2510 Atherosclerotic heart disease of native coronary artery without angina pectoris: Secondary | ICD-10-CM | POA: Insufficient documentation

## 2015-07-09 DIAGNOSIS — Z720 Tobacco use: Secondary | ICD-10-CM | POA: Insufficient documentation

## 2015-07-09 DIAGNOSIS — Z7951 Long term (current) use of inhaled steroids: Secondary | ICD-10-CM | POA: Insufficient documentation

## 2015-07-09 DIAGNOSIS — I252 Old myocardial infarction: Secondary | ICD-10-CM | POA: Insufficient documentation

## 2015-07-09 DIAGNOSIS — R0609 Other forms of dyspnea: Secondary | ICD-10-CM | POA: Insufficient documentation

## 2015-07-09 DIAGNOSIS — Z8669 Personal history of other diseases of the nervous system and sense organs: Secondary | ICD-10-CM | POA: Insufficient documentation

## 2015-07-09 DIAGNOSIS — Z7982 Long term (current) use of aspirin: Secondary | ICD-10-CM | POA: Insufficient documentation

## 2015-07-09 DIAGNOSIS — Z880 Allergy status to penicillin: Secondary | ICD-10-CM | POA: Insufficient documentation

## 2015-07-09 DIAGNOSIS — Z79899 Other long term (current) drug therapy: Secondary | ICD-10-CM | POA: Insufficient documentation

## 2015-07-09 DIAGNOSIS — R5383 Other fatigue: Secondary | ICD-10-CM | POA: Insufficient documentation

## 2015-07-09 LAB — CBC WITH DIFFERENTIAL/PLATELET
BASOS ABS: 0 10*3/uL (ref 0.0–0.1)
BASOS PCT: 1 %
EOS PCT: 2 %
Eosinophils Absolute: 0.2 10*3/uL (ref 0.0–0.7)
HEMATOCRIT: 45.6 % (ref 39.0–52.0)
Hemoglobin: 15.1 g/dL (ref 13.0–17.0)
Lymphocytes Relative: 26 %
Lymphs Abs: 2.1 10*3/uL (ref 0.7–4.0)
MCH: 29 pg (ref 26.0–34.0)
MCHC: 33.1 g/dL (ref 30.0–36.0)
MCV: 87.5 fL (ref 78.0–100.0)
MONO ABS: 1 10*3/uL (ref 0.1–1.0)
Monocytes Relative: 12 %
NEUTROS ABS: 4.9 10*3/uL (ref 1.7–7.7)
Neutrophils Relative %: 59 %
PLATELETS: 228 10*3/uL (ref 150–400)
RBC: 5.21 MIL/uL (ref 4.22–5.81)
RDW: 13.8 % (ref 11.5–15.5)
WBC: 8.3 10*3/uL (ref 4.0–10.5)

## 2015-07-09 LAB — COMPREHENSIVE METABOLIC PANEL
ALBUMIN: 3.7 g/dL (ref 3.5–5.0)
ALT: 39 U/L (ref 17–63)
AST: 33 U/L (ref 15–41)
Alkaline Phosphatase: 86 U/L (ref 38–126)
Anion gap: 8 (ref 5–15)
BUN: 15 mg/dL (ref 6–20)
CHLORIDE: 102 mmol/L (ref 101–111)
CO2: 28 mmol/L (ref 22–32)
Calcium: 9.2 mg/dL (ref 8.9–10.3)
Creatinine, Ser: 0.74 mg/dL (ref 0.61–1.24)
GFR calc Af Amer: >60 mL/min (ref >60)
GFR calc non Af Amer: >60 mL/min (ref >60)
GLUCOSE: 107 mg/dL — AB (ref 65–99)
POTASSIUM: 4 mmol/L (ref 3.5–5.1)
SODIUM: 138 mmol/L (ref 135–145)
Total Bilirubin: 0.6 mg/dL (ref 0.3–1.2)
Total Protein: 6.9 g/dL (ref 6.5–8.1)

## 2015-07-09 LAB — D-DIMER, QUANTITATIVE: D-Dimer, Quant: 0.72 ug/mL-FEU — ABNORMAL HIGH (ref 0.00–0.48)

## 2015-07-09 LAB — I-STAT TROPONIN, ED: Troponin i, poc: 0 ng/mL (ref 0.00–0.08)

## 2015-07-09 MED ORDER — LORAZEPAM 1 MG PO TABS
1.0000 mg | ORAL_TABLET | Freq: Once | ORAL | Status: AC
  Administered 2015-07-09: 1 mg via ORAL
  Filled 2015-07-09: qty 1

## 2015-07-09 MED ORDER — LORAZEPAM 1 MG PO TABS: ORAL_TABLET | ORAL | Status: DC

## 2015-07-09 MED ORDER — IOHEXOL 350 MG/ML SOLN
100.0000 mL | Freq: Once | INTRAVENOUS | Status: AC | PRN
  Administered 2015-07-09: 100 mL via INTRAVENOUS

## 2015-07-09 MED ORDER — SODIUM CHLORIDE 0.9 % IJ SOLN
INTRAMUSCULAR | Status: AC
  Filled 2015-07-09: qty 1000

## 2015-07-09 MED ORDER — SODIUM CHLORIDE 0.9 % IJ SOLN
INTRAMUSCULAR | Status: AC
  Filled 2015-07-09: qty 60

## 2015-07-09 NOTE — Progress Notes (Signed)
Cardiology Office Note   Date:  07/09/2015   ID:  Randy Hebert, DOB Jun 07, 1966, MRN [id number]  PCP:  Soyla Dryer, PA-C  Cardiologist: Cecil Cranker, NP   No chief complaint on file.     ERROR Cancelled appointment

## 2015-07-09 NOTE — Discharge Instructions (Signed)
Follow up with your heart md later this week for recheck

## 2015-07-09 NOTE — ED Provider Notes (Signed)
CSN: [id number]     Arrival date & time 07/09/15  4332 History  By signing my name below, I, Soijett Blue, attest that this documentation has been prepared under the direction and in the presence of Milton Ferguson, MD. Electronically Signed: Soijett Blue, ED Scribe. 07/09/2015. 9:25 AM.   Chief Complaint  Patient presents with  . Shortness of Breath      Patient is a 49 y.o. male presenting with shortness of breath. The history is provided by the patient (patient). No language interpreter was used.  Shortness of Breath Severity:  Moderate Onset quality:  Sudden Duration:  2 weeks Timing:  Constant Progression:  Worsening Chronicity:  New Context: smoke exposure   Relieved by:  None tried Worsened by:  Nothing tried Ineffective treatments:  None tried Associated symptoms: no abdominal pain, no chest pain, no cough, no headaches and no rash   Risk factors: tobacco use     HPI Comments: HARPREET POMPEY is a 49 y.o. male with a medical hx of Pleurisy, CAD, and MI who presents to the Emergency Department complaining of progressively worsening, constant, SOB onset 2 weeks. He reports that he has been having intermittent SOB since his MI June and worsened 2 weeks ago. He also reports that he had an allergic reaction to lisinopril recently that caused his lower lobe to collaspe and him needing a breathing tube. He denies a medical hx of CHF. He states that he is having associated symptoms of dyspnea with exertion, increased fatigue, and anxiety. He states that he has not tried any medications for the relief for his symptoms. He denies any other symptoms. Pt PCP is Soyla Dryer, PA-C. He reports that he smokes 1 PPD.    Past Medical History  Diagnosis Date  . Neck pain   . Otitis media   . Pleurisy   . CAD (coronary artery disease)     lateral STEMI 02/06/2015 00% D1 occlusion treated with Promus Premier 2.5 mm x 16 mm DES, 70% ramus stenosis, 40% mid RCA stenosis, 45% distal RCA  stenosis, EF 45-50%  . Sinus pause     9 sec sinus pause on telemetry after started on coreg after MI, avoid AV nodal blocking agent  . MI (myocardial infarction) [facility])    Past Surgical History  Procedure Laterality Date  . Incision / drainage hand / finger    . Cardiac catheterization N/A 02/06/2015    Procedure: Left Heart Cath and Coronary Angiography;  Surgeon: Leonie Man, MD;  Location: [place] [facility];  Service: Cardiovascular;  Laterality: N/A;  . Cardiac catheterization N/A 02/06/2015    Procedure: Coronary Stent Intervention;  Surgeon: Leonie Man, MD;  Location: [place] [facility];  Service: Cardiovascular;  Laterality: N/A;   Family History  Problem Relation Age of Onset  . Heart attack Father   . Stroke Father    Social History  Substance Use Topics  . Smoking status: Current Some Day Smoker -- 0.50 packs/day for 46 years    Types: Cigarettes    Start date: 06/07/1979  . Smokeless tobacco: Former Systems developer    Types: Chew  . Alcohol Use: 3.0 - 3.6 oz/week    5-6 Shots of liquor, 0 Standard drinks or equivalent per week    Review of Systems  Constitutional: Positive for fatigue. Negative for appetite change.  HENT: Negative for congestion, ear discharge and sinus pressure.   Eyes: Negative for discharge.  Respiratory: Positive for shortness of breath. Negative for  cough.   Cardiovascular: Negative for chest pain.  Gastrointestinal: Negative for abdominal pain and diarrhea.  Genitourinary: Negative for frequency and hematuria.  Musculoskeletal: Negative for back pain.  Skin: Negative for rash.  Neurological: Negative for seizures and headaches.  Psychiatric/Behavioral: Negative for hallucinations.      Allergies  Lisinopril; Amoxicillin; and Carvedilol  Home Medications   Prior to Admission medications   Medication Sig Start Date End Date Taking? Authorizing Provider  amLODipine (NORVASC) 5 MG tablet Take 1 tablet (5 mg total) by mouth daily. 06/27/15    Soyla Dryer, PA-C  aspirin EC 81 MG tablet Take 81 mg by mouth daily.    Historical Provider, MD  CRESTOR 20 MG tablet TAKE 1 Tablet BY MOUTH EVERY NIGHT AT BEDTIME FOR CHOLESTEROL 03/15/15   Historical Provider, MD  famotidine (PEPCID) 20 MG tablet Take 1 tablet (20 mg total) by mouth 2 (two) times daily. Patient not taking: Reported on 06/27/2015 06/04/15   Samuella Cota, MD  fluticasone [facility]) 50 MCG/ACT nasal spray Place 1 spray into both nostrils daily.    Historical Provider, MD  nicotine (NICODERM CQ - DOSED IN MG/24 HOURS) 21 mg/24hr patch Place 1 patch (21 mg total) onto the skin daily. 06/04/15   Samuella Cota, MD  nitroGLYCERIN (NITROSTAT) 0.4 MG SL tablet Place 1 tablet (0.4 mg total) under the tongue every 5 (five) minutes as needed for chest pain. 02/09/15   Almyra Deforest, PA  ticagrelor (BRILINTA) 90 MG TABS tablet Take 1 tablet (90 mg total) by mouth 2 (two) times daily. Patient not taking: Reported on 06/27/2015 03/14/15   Lendon Colonel, NP   BP 151/103 mmHg  Pulse 91  Temp(Src) 97.7 F (36.5 C) (Oral)  Resp 22  Ht '5\' 5"'$  (1.651 m)  Wt 220 lb (99.791 kg)  BMI 36.61 kg/m2  SpO2 96% Physical Exam  Constitutional: He is oriented to person, place, and time. He appears well-developed.  Mildly anxious.   HENT:  Head: Normocephalic.  Eyes: Conjunctivae and EOM are normal. No scleral icterus.  Neck: Neck supple. No thyromegaly present.  Cardiovascular: Normal rate and regular rhythm.  Exam reveals no gallop and no friction rub.   No murmur heard. Pulmonary/Chest: No stridor. He has wheezes. He has no rales. He exhibits no tenderness.  Minimal wheezing bilaterally.  Abdominal: He exhibits no distension. There is no tenderness. There is no rebound.  Musculoskeletal: Normal range of motion. He exhibits no edema.  Lymphadenopathy:    He has no cervical adenopathy.  Neurological: He is oriented to person, place, and time. He exhibits normal muscle tone. Coordination  normal.  Skin: No rash noted. No erythema.  Psychiatric: He has a normal mood and affect. His behavior is normal.  Nursing note and vitals reviewed.   ED Course  Procedures (including critical care time) DIAGNOSTIC STUDIES: Oxygen Saturation is 96% on RA, nl by my interpretation.    COORDINATION OF CARE: 9:24 AM Discussed treatment plan with pt at bedside which includes EKG and pt agreed to plan.    Labs Review Labs Reviewed  COMPREHENSIVE METABOLIC PANEL - Abnormal; Notable for the following:    Glucose, Bld 107 (*)    All other components within normal limits  D-DIMER, QUANTITATIVE (NOT AT [facility]) - Abnormal; Notable for the following:    D-Dimer, Quant 0.72 (*)    All other components within normal limits  CBC WITH DIFFERENTIAL/PLATELET  Randolm Idol, ED    Imaging Review Dg Chest 2 View  07/09/2015  CLINICAL DATA:  49 year old male with weakness and shortness of breath on exertion following myocardial infarction in June. Subsequent encounter. EXAM: CHEST  2 VIEW COMPARISON:  06/03/2015 and earlier. FINDINGS: Improved lung volumes and bibasilar ventilation. No pneumothorax, pulmonary edema, pleural effusion or confluent pulmonary opacity. Stable cardiac size at the upper limits of normal. 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 Stable mediastinal contours. Visualized tracheal air column is within normal limits. No acute osseous abnormality identified. IMPRESSION: No acute cardiopulmonary abnormality. 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 Electronically Signed   By: Genevie Ann M.D.   On: 07/09/2015 09:58   I have personally reviewed and evaluated these images and lab results as part of my medical decision-making.   EKG Interpretation   Date/Time:  Monday July 09 2015 09:16:51 EST Ventricular Rate:  84 PR Interval:  159 QRS Duration: 91 QT Interval:  368 QTC Calculation: 435 R Axis:   123 Text Interpretation:  Sinus rhythm Probable right  ventricular hypertrophy  Lateral infarct, old Confirmed by Danyel Tobey  MD, Broadus John [phone]) on 07/09/2015  3:34:00 PM      MDM   Final diagnoses:  None   Patient with anxiety and shortness of breath which has been going on since his MI in June. Labs unremarkable except for mildly elevated d-dimer. CT angiogram was negative for PE although it wasn't the best study. Doubt coronary disease causing problem will send patient home with Ativan and he is to follow-up with his cardiologist this week   Milton Ferguson, MD 07/09/15 1535

## 2015-07-09 NOTE — ED Notes (Signed)
Pt attempted to sign discharge paperwork, but Epic wasn't responding and wouldn't allow signature on multiple signature pads.

## 2015-07-09 NOTE — ED Notes (Signed)
Pt c/o increasing SOB and weakness since heart attack this past June. Pt reports allergic reaction to Lisinopril recently with lower lobe collapsing and having breathing tube. Pt saw PCP last month and was given medications for high cholesterol and high blood pressure and ever since he started them he reports increased anxiety.

## 2015-07-09 NOTE — ED Notes (Addendum)
Pt c/o of increasingly worse SOB and dyspnea with exertion x 2 weeks. Pt also reports increased fatigue and "excessive weight gain." Denies hx of CHF. AOx4. Denies CP at this time.

## 2015-07-11 ENCOUNTER — Ambulatory Visit (INDEPENDENT_AMBULATORY_CARE_PROVIDER_SITE_OTHER): Payer: Self-pay | Admitting: Physician Assistant

## 2015-07-11 ENCOUNTER — Encounter: Payer: Self-pay | Admitting: Physician Assistant

## 2015-07-11 VITALS — BP 108/60 | HR 106 | Ht 65.0 in | Wt 222.0 lb

## 2015-07-11 DIAGNOSIS — R0600 Dyspnea, unspecified: Secondary | ICD-10-CM

## 2015-07-11 DIAGNOSIS — T783XXD Angioneurotic edema, subsequent encounter: Secondary | ICD-10-CM

## 2015-07-11 DIAGNOSIS — I455 Other specified heart block: Secondary | ICD-10-CM

## 2015-07-11 DIAGNOSIS — I10 Essential (primary) hypertension: Secondary | ICD-10-CM

## 2015-07-11 DIAGNOSIS — I2510 Atherosclerotic heart disease of native coronary artery without angina pectoris: Secondary | ICD-10-CM

## 2015-07-11 DIAGNOSIS — T464X5D Adverse effect of angiotensin-converting-enzyme inhibitors, subsequent encounter: Secondary | ICD-10-CM

## 2015-07-11 DIAGNOSIS — R0602 Shortness of breath: Secondary | ICD-10-CM

## 2015-07-11 DIAGNOSIS — F17200 Nicotine dependence, unspecified, uncomplicated: Secondary | ICD-10-CM

## 2015-07-11 MED ORDER — CLOPIDOGREL BISULFATE 75 MG PO TABS: 75.0000 mg | ORAL_TABLET | Freq: Every day | ORAL | Status: DC

## 2015-07-11 MED ORDER — ALBUTEROL SULFATE HFA 108 (90 BASE) MCG/ACT IN AERS: 2.0000 | INHALATION_SPRAY | Freq: Four times a day (QID) | RESPIRATORY_TRACT | Status: DC | PRN

## 2015-07-11 NOTE — Assessment & Plan Note (Signed)
Blood pressure well controlled on amlodipine.

## 2015-07-11 NOTE — Assessment & Plan Note (Signed)
This required intubation.

## 2015-07-11 NOTE — Progress Notes (Signed)
Cardiology Office Note   Date:  07/11/2015   ID:  [name], DOB Jul 02, 1966, MRN [id number]  PCP:  Soyla Dryer, PA-C  Cardiologist: Dr. [name]  Chief Complaint: Shortness of breath    History of Present Illness: Randy Hebert is a 49 y.o. male who presents for one-month follow-up. The patient has history of CAD status post STEMI in 02/06/15 treated with DES to the first diagonal with residual 70% ramus, 40% mid RCA and 45% distal RCA with EF 45-50% with anterolateral and apical hypokinesis. The patient then had an admission with severe angioedema of the pharynx resulting in airway compromise and intubation felt secondary to lisinopril. He also had acute systolic heart failure. 2-D echo 01/2015 EF 40% with mild LVH aortic valve sclerosis without stenosis. He also had a 9 second positive on telemetry after his MI while on Coreg. He AV nodal blocking agents are to be avoided.   Patient was in the ER 07/09/15 with worsening shortness of breath and anxiety. He had a mildly elevated d-dimer and CT angiogram was negative for PE, although poor quality test. Troponin 0. EKG from ER on 11/7 and 07/10/15 shows normal sinus rhythm with RVH and no acute change. He was sent home on Ativan to follow-up.  Patient comes in today complaining of continued shortness of breath. He says he gets very anxious when he gets out of breath. The Ativan has helped a little but he is having vivid nightmares. 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 He denies any chest pain but feels like he gets out of breath. He denies any edema, chest pain or palpitations. He actually stopped all his drugs for 3 days hoping he would feel better but was told to get back on them. He continues to smoke three quarters of a pack of cigarettes daily. He takes his neck and arm patch off when he smokes. He also drinks a half a gallon of whiskey every 10 days.    Past Medical History   Diagnosis Date  . Neck pain   . Otitis media   . Pleurisy   . CAD (coronary artery disease)     lateral STEMI 02/06/2015 00% D1 occlusion treated with Promus Premier 2.5 mm x 16 mm DES, 70% ramus stenosis, 40% mid RCA stenosis, 45% distal RCA stenosis, EF 45-50%  . Sinus pause     9 sec sinus pause on telemetry after started on coreg after MI, avoid AV nodal blocking agent  . MI (myocardial infarction) [facility])     Past Surgical History  Procedure Laterality Date  . Incision / drainage hand / finger    . Cardiac catheterization N/A 02/06/2015    Procedure: Left Heart Cath and Coronary Angiography;  Surgeon: Leonie Man, MD;  Location: [place] [facility];  Service: Cardiovascular;  Laterality: N/A;  . Cardiac catheterization N/A 02/06/2015    Procedure: Coronary Stent Intervention;  Surgeon: Leonie Man, MD;  Location: [place] [facility];  Service: Cardiovascular;  Laterality: N/A;     Current Outpatient Prescriptions  Medication Sig Dispense Refill  . amLODipine (NORVASC) 5 MG tablet Take 1 tablet (5 mg total) by mouth daily. 30 tablet 3  . aspirin EC 81 MG tablet Take 81 mg by mouth daily.    . CRESTOR 20 MG tablet TAKE 1 Tablet BY MOUTH EVERY NIGHT AT BEDTIME FOR CHOLESTEROL  1  . ibuprofen (ADVIL,MOTRIN) 200 MG tablet  Take 800 mg by mouth every 6 (six) hours as needed.    Marland Kitchen LORazepam (ATIVAN) 1 MG tablet Take one every 12 hours as needed for anxiety 20 tablet 0  . Menthol, Topical Analgesic, (BENGAY EX) Apply 1 application topically daily.    . nicotine (NICODERM CQ - DOSED IN MG/24 HOURS) 21 mg/24hr patch Place 1 patch (21 mg total) onto the skin daily. 28 patch 0  . nitroGLYCERIN (NITROSTAT) 0.4 MG SL tablet Place 1 tablet (0.4 mg total) under the tongue every 5 (five) minutes as needed for chest pain. 25 tablet 3  . sodium chloride (OCEAN) 0.65 % SOLN nasal spray Place 1 spray into both nostrils as needed for congestion.    Marland Kitchen albuterol (PROVENTIL HFA;VENTOLIN HFA) 108 (90 BASE)  MCG/ACT inhaler Inhale 2 puffs into the lungs every 6 (six) hours as needed for wheezing or shortness of breath. 1 Inhaler 2  . clopidogrel (PLAVIX) 75 MG tablet Take 1 tablet (75 mg total) by mouth daily. 90 tablet 3   No current facility-administered medications for this visit.    Allergies:   Lisinopril; Amoxicillin; and Carvedilol    Social History:  The patient  reports that he has been smoking Cigarettes.  He started smoking about 36 years ago. He has a 23 pack-year smoking history. He has quit using smokeless tobacco. His smokeless tobacco use included Chew. He reports that he drinks about 3.0 - 3.6 oz of alcohol per week. He reports that he does not use illicit drugs.   Family History:  The patient's    family history includes Heart attack in his father; Stroke in his father.    ROS:  Please see the history of present illness.   Otherwise, review of systems are positive for none.   All other systems are reviewed and negative.    PHYSICAL EXAM: VS:  BP 108/60 mmHg  Pulse 106  Ht '5\' 5"'$  (1.651 m)  Wt 222 lb (100.699 kg)  BMI 36.94 kg/m2  SpO2 95% , BMI Body mass index is 36.94 kg/(m^2). GEN: Obese, in no acute distress Neck: no JVD, HJR, carotid bruits, or masses Cardiac:  RRR; distant heart sounds, positive S4, no murmurs, rubs, thrill or heave,  Respiratory:  Decreased breath sounds with scattered wheezes and rhonchi  GI: soft, nontender, nondistended, + BS MS: no deformity or atrophy Extremities: without cyanosis, clubbing, edema, good distal pulses bilaterally.  Skin: warm and dry, no rash Neuro:  Strength and sensation are intact    EKG:  EKG is not ordered today. EKG is reviewed from 07/09/15 and 07/10/15 and show no acute change   Recent Labs: 06/03/2015: Magnesium 2.7* 06/27/2015: TSH 2.564 07/09/2015: ALT 39; BUN 15; Creatinine, Ser 0.74; Hemoglobin 15.1; Platelets 228; Potassium 4.0; Sodium 138    Lipid Panel    Component Value Date/Time   TRIG 432*  06/02/2015 0448      Wt Readings from Last 3 Encounters:  07/11/15 222 lb (100.699 kg)  07/09/15 220 lb (99.791 kg)  06/27/15 218 lb 9.6 oz (99.156 kg)      Other studies Reviewed: Additional studies/ records that were reviewed today include and review of the records demonstrates:  CT angios of the chest 07/09/15 IMPRESSION: 1. Suboptimal contrast bolus timing in the pulmonary arterial tree, poor in the lobar vessels other than the right lower lobe. No central pulmonary embolus. If there is continued suspicion of acute PE nuclear medicine V/Q scan may be most helpful. 2. 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 Mirror image branching. Other visible situs including cardiac and upper abdominal situs appear normal. 3. Calcified Coronary artery atherosclerosis. Hepatomegaly with fatty liver disease.     Electronically Signed   By: Genevie Ann M.D.   On: 07/09/2015 14:22  Cardiac catheterization 02/06/15 Conclusion     1. 1st Diag lesion, 100% stenosed. A Promus Premier 2.5 mm x 16 mm drug-eluting stent was placed. There is a 0% residual stenosis post intervention. 2. Ramus lesion, 70% stenosed. 3. Mid RCA lesion, 40% stenosed. Dist RCA lesion, 45% stenosed. 4. Mild to moderately reduced LVEF with anterolateral and apical hypokinesis and elevated LVEDP   Abnormal anatomy with equal sized Diagonal and LAD branch but extensive RCA. Successful PCI of the First Diagonal Branch.  Recommendations:  Standard post radial cath/PCI TR band removal  Dual independent therapy for minimum one year.  Check 2-D echocardiogram to better assess EF  Add statin and beta blocker. Smoking cessation counseling. Cardiac Rehabilitation And Case Management Consultation  If hemodynamic stable, would consider fast-track discharge  Would consider noninvasive evaluation of the Ramus Intermedius lesion in the absence of any recurrent anginal pain.  Likely benefit  from follow-up in the [place] office.   Leonie Man, M.D., M.S.   2-D echo 02/06/15 Study Conclusions  - Left ventricle: Moderately severe hypokinesis of mid   anterolateral segment, apical lateral segment, mid/apical   anterior segments. EF is 40%. The cavity size was normal. Wall   thickness was increased in a pattern of mild LVH. - Aortic valve: Sclerosis without stenosis. There was no   significant regurgitation. - Right ventricle: The cavity size was normal. Systolic function   was normal.  ASSESSMENT AND PLAN: Dyspnea Patient continues to have dyspnea that is causing a lot of anxiety. It prompted an emergency room visit 2 nights ago. He thinks it's due to his medications and out of proportion to his MI. He has no evidence of heart failure and exam. CT scan was negative for PE. Discussed this patient in detail with Dr. Carlyle Dolly. We will try to switch his Brilinta to Plavix to see if this makes a difference for him. We will also give him an albuterol inhaler to use when necessary. Check PFTs. Follow-up with Dr. [name] next week. If his symptoms don't improve he will consider ordering a stress Myoview. Smoking cessation necessary.  CAD (coronary artery disease) Patient suffered a STEMI in 02/06/15 treated with DES to the diagonal, he has residual 70% ramus, 40% mid RCA and 45% distal RCA. EF 40-45%. We'll try to switch his Brilinta to Plavix to see if this helps his breathing. Continue aspirin. May need stress test if symptoms don't improve.  Essential hypertension Blood pressure well controlled on amlodipine.  Sinus pause secondary to beta blocker Hold off on AV nodal blocking agents.  ACE inhibitor-aggravated angioedema This required intubation.  Tobacco use disorder Smoking cessation discussed with patient. He really needs to stop smoking. He is taking his nicotine patch off so he can smoke.     Sumner Boast, PA-C  07/11/2015 12:36 PM    [facility] [place], [place], [place]  [address] Phone: [phone]; Fax: [phone]

## 2015-07-11 NOTE — Assessment & Plan Note (Signed)
Hold off on AV nodal blocking agents.

## 2015-07-11 NOTE — Assessment & Plan Note (Signed)
Smoking cessation discussed with patient. He really needs to stop smoking. He is taking his nicotine patch off so he can smoke.

## 2015-07-11 NOTE — Assessment & Plan Note (Addendum)
Patient suffered a STEMI in 02/06/15 treated with DES to the diagonal, he has residual 70% ramus, 40% mid RCA and 45% distal RCA. EF 40-45%. We'll try to switch his Brilinta to Plavix to see if this helps his breathing. Continue aspirin. May need stress test if symptoms don't improve.

## 2015-07-11 NOTE — Assessment & Plan Note (Signed)
Patient continues to have dyspnea that is causing a lot of anxiety. It prompted an emergency room visit 2 nights ago. He thinks it's due to his medications and out of proportion to his MI. He has no evidence of heart failure and exam. CT scan was negative for PE. Discussed this patient in detail with Dr. Carlyle Dolly. We will try to switch his Brilinta to Plavix to see if this makes a difference for him. We will also give him an albuterol inhaler to use when necessary. Check PFTs. Follow-up with Dr. [name] next week. If his symptoms don't improve he will consider ordering a stress Myoview. Smoking cessation necessary.

## 2015-07-11 NOTE — Patient Instructions (Addendum)
Medication Instructions:  Discontinue Brilinta ( take your dose this evening) Start Plavix  Labwork: none  Testing/Procedures: Your physician has recommended that you have a pulmonary function test. Pulmonary Function Tests are a group of tests that measure how well air moves in and out of your lungs.    Follow-Up: Your physician recommends that you schedule a follow-up appointment in: next week with Dr. [name]   Any Other Special Instructions Will Be Listed Below (If Applicable). I have given you a prescription for Albuterol & Plavix for you to take to the free clinic.    If you need a refill on your cardiac medications before your next appointment, please call your pharmacy. Thanks for choosing [facility]!!!

## 2015-07-13 ENCOUNTER — Ambulatory Visit (HOSPITAL_COMMUNITY)
Admission: RE | Admit: 2015-07-13 | Discharge: 2015-07-13 | Disposition: A | Discharge status: Home / Self Care | Payer: MEDICAID | Source: Ambulatory Visit | Attending: Physician Assistant | Admitting: Physician Assistant

## 2015-07-13 DIAGNOSIS — R0602 Shortness of breath: Secondary | ICD-10-CM | POA: Insufficient documentation

## 2015-07-13 LAB — BLOOD GAS, ARTERIAL
Acid-Base Excess: 2.7 mmol/L — ABNORMAL HIGH (ref 0.0–2.0)
Bicarbonate: 26.8 mEq/L — ABNORMAL HIGH (ref 20.0–24.0)
FIO2: 0.21
O2 Saturation: 89.6 %
PCO2 ART: 38.5 mmHg (ref 35.0–45.0)
PO2 ART: 56.4 mmHg — AB (ref 80.0–100.0)
pH, Arterial: 7.451 — ABNORMAL HIGH (ref 7.350–7.450)

## 2015-07-13 LAB — PULMONARY FUNCTION TEST
DL/VA % PRED: 79 %
DL/VA: 3.4 ml/min/mmHg/L
DLCO UNC % PRED: 60 %
DLCO UNC: 15.58 ml/min/mmHg
FEF 25-75 Post: 2.38 L/sec
FEF 25-75 Pre: 2 L/sec
FEF2575-%Change-Post: 19 %
FEF2575-%PRED-POST: 79 %
FEF2575-%Pred-Pre: 66 %
FEV1-%CHANGE-POST: 1 %
FEV1-%Pred-Post: 66 %
FEV1-%Pred-Pre: 65 %
FEV1-POST: 2.2 L
FEV1-Pre: 2.16 L
FEV1FVC-%CHANGE-POST: 5 %
FEV1FVC-%PRED-PRE: 101 %
FEV6-%Change-Post: -3 %
FEV6-%Pred-Post: 64 %
FEV6-%Pred-Pre: 67 %
FEV6-PRE: 2.74 L
FEV6-Post: 2.64 L
FEV6FVC-%Pred-Post: 104 %
FEV6FVC-%Pred-Pre: 104 %
FVC-%Change-Post: -3 %
FVC-%PRED-POST: 62 %
FVC-%PRED-PRE: 64 %
FVC-PRE: 2.74 L
FVC-Post: 2.64 L
POST FEV1/FVC RATIO: 83 %
PRE FEV6/FVC RATIO: 100 %
Post FEV6/FVC ratio: 100 %
Pre FEV1/FVC ratio: 79 %
RV % PRED: 97 %
RV: 1.73 L
TLC % PRED: 76 %
TLC: 4.55 L

## 2015-07-13 MED ORDER — ALBUTEROL SULFATE (2.5 MG/3ML) 0.083% IN NEBU
2.5000 mg | INHALATION_SOLUTION | Freq: Once | RESPIRATORY_TRACT | Status: AC
  Administered 2015-07-13: 2.5 mg via RESPIRATORY_TRACT

## 2015-07-17 ENCOUNTER — Telehealth: Payer: Self-pay | Admitting: Adult Health

## 2015-07-17 NOTE — Telephone Encounter (Signed)
Will forward to Gerrianne Scale PA-C for note

## 2015-07-17 NOTE — Telephone Encounter (Signed)
Pt would like to know if he can get a return to work note

## 2015-07-18 ENCOUNTER — Encounter: Payer: Self-pay | Admitting: Physician Assistant

## 2015-07-18 ENCOUNTER — Ambulatory Visit: Payer: Self-pay | Admitting: Physician Assistant

## 2015-07-18 VITALS — BP 144/80 | HR 100 | Temp 99.5°F | Ht 65.0 in | Wt 223.9 lb

## 2015-07-18 DIAGNOSIS — F411 Generalized anxiety disorder: Secondary | ICD-10-CM

## 2015-07-18 DIAGNOSIS — J449 Chronic obstructive pulmonary disease, unspecified: Secondary | ICD-10-CM

## 2015-07-18 DIAGNOSIS — I10 Essential (primary) hypertension: Secondary | ICD-10-CM

## 2015-07-18 MED ORDER — MOMETASONE FURO-FORMOTEROL FUM 100-5 MCG/ACT IN AERO: 2.0000 | INHALATION_SPRAY | Freq: Two times a day (BID) | RESPIRATORY_TRACT | Status: DC

## 2015-07-18 MED ORDER — ALBUTEROL SULFATE HFA 108 (90 BASE) MCG/ACT IN AERS: 2.0000 | INHALATION_SPRAY | Freq: Four times a day (QID) | RESPIRATORY_TRACT | Status: DC | PRN

## 2015-07-18 NOTE — Telephone Encounter (Signed)
Spoke to pt and explained he would have to wait until his fu with J[facility] to talk about a return to work note. He understood.

## 2015-07-18 NOTE — Progress Notes (Signed)
BP 144/80 mmHg  Pulse 100  Temp(Src) 99.5 F (37.5 C)  Ht '5\' 5"'$  (1.651 m)  Wt 223 lb 14.4 oz (101.56 kg)  BMI 37.26 kg/m2  SpO2 95%   Subjective:    Patient ID: Randy Hebert, male    DOB: Jan 06, 1966, 49 y.o.   MRN: [id number]  HPI: Randy Hebert is a 49 y.o. male presenting on 07/18/2015 for Hypertension   HPI  Chief Complaint  Patient presents with  . Hypertension      Pt did not contact [facility] for appt with [place] specialist for anxiety as directed to do at his last [facility] in October.  He says he is feeling well  Relevant past medical, surgical, family and social history reviewed and updated as indicated. Interim medical history since our last visit reviewed. Allergies and medications reviewed and updated.  Current outpatient prescriptions:  .  albuterol (PROVENTIL HFA;VENTOLIN HFA) 108 (90 BASE) MCG/ACT inhaler, Inhale 2 puffs into the lungs every 6 (six) hours as needed for wheezing or shortness of breath., Disp: 1 Inhaler, Rfl: 2 .  amLODipine (NORVASC) 5 MG tablet, Take 1 tablet (5 mg total) by mouth daily., Disp: 30 tablet, Rfl: 3 .  aspirin EC 81 MG tablet, Take 81 mg by mouth daily., Disp: , Rfl:  .  CRESTOR 20 MG tablet, TAKE 1 Tablet BY MOUTH EVERY NIGHT AT BEDTIME FOR CHOLESTEROL, Disp: , Rfl: 1 .  ibuprofen (ADVIL,MOTRIN) 200 MG tablet, Take 800 mg by mouth every 6 (six) hours as needed., Disp: , Rfl:  .  LORazepam (ATIVAN) 1 MG tablet, Take one every 12 hours as needed for anxiety, Disp: 20 tablet, Rfl: 0 .  Menthol, Topical Analgesic, (BENGAY EX), Apply 1 application topically daily., Disp: , Rfl:  .  Multiple Vitamins-Minerals (MULTIVITAMIN MEN PO), Take by mouth daily., Disp: , Rfl:  .  nicotine (NICODERM CQ - DOSED IN MG/24 HOURS) 21 mg/24hr patch, Place 1 patch (21 mg total) onto the skin daily., Disp: 28 patch, Rfl: 0 .  nitroGLYCERIN (NITROSTAT) 0.4 MG SL tablet, Place 1 tablet (0.4 mg total) under the tongue every 5 (five)  minutes as needed for chest pain., Disp: 25 tablet, Rfl: 3 .  sodium chloride (OCEAN) 0.65 % SOLN nasal spray, Place 1 spray into both nostrils as needed for congestion., Disp: , Rfl:  .  ticagrelor (BRILINTA) 90 MG TABS tablet, Take 90 mg by mouth 2 (two) times daily., Disp: , Rfl:  .  clopidogrel (PLAVIX) 75 MG tablet, Take 1 tablet (75 mg total) by mouth daily. (Patient not taking: Reported on 07/18/2015), Disp: 90 tablet, Rfl: 3   Review of Systems  Constitutional: Positive for diaphoresis. Negative for fever, chills, appetite change, fatigue and unexpected weight change.  HENT: Positive for drooling and voice change. Negative for congestion, dental problem, ear pain, facial swelling, hearing loss, mouth sores, sneezing, sore throat and trouble swallowing.   Eyes: Negative for pain, discharge, redness, itching and visual disturbance.  Respiratory: Positive for shortness of breath and wheezing. Negative for cough and choking.   Cardiovascular: Negative for chest pain, palpitations and leg swelling.  Gastrointestinal: Positive for diarrhea. Negative for vomiting, abdominal pain, constipation and blood in stool.  Endocrine: Positive for heat intolerance. Negative for cold intolerance and polydipsia.  Genitourinary: Negative for dysuria, hematuria and decreased urine volume.  Musculoskeletal: Positive for back pain. Negative for arthralgias and gait problem.  Skin: Negative for rash.  Allergic/Immunologic: Negative for environmental allergies.  Neurological:  Negative for seizures, syncope, light-headedness and headaches.  Hematological: Negative for adenopathy.  Psychiatric/Behavioral: Positive for agitation. Negative for suicidal ideas and dysphoric mood. The patient is not nervous/anxious.     Per HPI unless specifically indicated above     Objective:    BP 144/80 mmHg  Pulse 100  Temp(Src) 99.5 F (37.5 C)  Ht '5\' 5"'$  (1.651 m)  Wt 223 lb 14.4 oz (101.56 kg)  BMI 37.26 kg/m2  SpO2  95%  Wt Readings from Last 3 Encounters:  07/18/15 223 lb 14.4 oz (101.56 kg)  07/11/15 222 lb (100.699 kg)  07/09/15 220 lb (99.791 kg)    Physical Exam  Constitutional: He is oriented to person, place, and time. He appears well-developed and well-nourished.  HENT:  Head: Normocephalic and atraumatic.  Voice improved from previous OV  Neck: Neck supple.  Cardiovascular: Normal rate and regular rhythm.   Pulmonary/Chest: Effort normal and breath sounds normal. He has no wheezes.  Abdominal: Soft. Bowel sounds are normal. There is no tenderness.  Lymphadenopathy:    He has no cervical adenopathy.  Neurological: He is alert and oriented to person, place, and time.  Skin: Skin is warm and dry.  Psychiatric: He has a normal mood and affect. His behavior is normal.  Vitals reviewed.       Assessment & Plan:   Encounter Diagnoses  Name Primary?  . Essential hypertension Yes  . Chronic obstructive pulmonary disease, unspecified COPD type ([place])   . Generalized anxiety disorder    -Call for [facility] appt (gave pt another card for [facility]) -Counseled on smoking cessation -Gave sample dulera. Will order from [facility] -F/u 1 mo to recheck bp and breathing

## 2015-07-18 NOTE — Telephone Encounter (Signed)
Patient still having a lot of shortness of breath. Needs to f/u with Dr. [name] before he returns to work. Dr. [name] needs to review his PFT's as well. Thanks, Selinda Eon

## 2015-07-25 ENCOUNTER — Encounter: Payer: Self-pay | Admitting: Cardiology

## 2015-07-25 NOTE — Progress Notes (Signed)
Patient ID: Randy Hebert, male   DOB: 1965-12-12, 49 y.o.   MRN: [id number] Encounter opened in error

## 2015-08-03 ENCOUNTER — Ambulatory Visit (INDEPENDENT_AMBULATORY_CARE_PROVIDER_SITE_OTHER): Payer: Self-pay | Admitting: Cardiology

## 2015-08-03 ENCOUNTER — Encounter: Payer: Self-pay | Admitting: Cardiology

## 2015-08-03 VITALS — BP 124/84 | HR 72 | Ht 65.0 in | Wt 220.0 lb

## 2015-08-03 DIAGNOSIS — E785 Hyperlipidemia, unspecified: Secondary | ICD-10-CM

## 2015-08-03 DIAGNOSIS — I2510 Atherosclerotic heart disease of native coronary artery without angina pectoris: Secondary | ICD-10-CM

## 2015-08-03 MED ORDER — PRAVASTATIN SODIUM 20 MG PO TABS: 20.0000 mg | ORAL_TABLET | Freq: Every evening | ORAL | Status: DC

## 2015-08-03 MED ORDER — ISOSORBIDE MONONITRATE ER 30 MG PO TB24: 15.0000 mg | ORAL_TABLET | Freq: Every day | ORAL | Status: DC

## 2015-08-03 NOTE — Progress Notes (Signed)
Patient ID: Randy Hebert, male   DOB: May 05, 1966, 49 y.o.   MRN: [id number]     Clinical Summary Randy Hebert is a 49 y.o.male seen today for follow up of the following medical problems.   1. CAD - admit 01/2015 with lateral STEMI, received DES to D1. There was a 70% ramus and 40% mid RCA that was medically managed - 01/2015 echo LVEF 40%, lateral wall hypokinesis - no beta blocker due to history of sinus pause and bradycardia. No ACE-I given history of severe angioedema, no ARB due to risk of cross reactivity  - can get mild sharp pain left chest. Not positional. Lasts just a few seconds. No other chest pain.    2. SOB - 07/2015 PFTs: mild to mod ventilatory defect with small airway obstruction.  - 07/2015 CT PE no PE, though suboptimal study - 07/2015 CXR no acute process.  PFTs with mild to mod vent defect, ABGs showed borderline resting hypoxemia.  - last visit we changed his brillinta to plavix given his symptoms, started on prn albuterol. Given samples of dulera by pcp. Breathing somewhat better since that time though only mixed compliance with inhalers.    3. Hyperlipidemia - he reports anxiety on crestor, he stopped taking.   Past Medical History  Diagnosis Date  . Neck pain   . Otitis media   . Pleurisy   . CAD (coronary artery disease)     lateral STEMI 02/06/2015 00% D1 occlusion treated with Promus Premier 2.5 mm x 16 mm DES, 70% ramus stenosis, 40% mid RCA stenosis, 45% distal RCA stenosis, EF 45-50%  . Sinus pause     9 sec sinus pause on telemetry after started on coreg after MI, avoid AV nodal blocking agent  . MI (myocardial infarction) ([place])      Allergies  Allergen Reactions  . Lisinopril Shortness Of Breath and Swelling    Angioedema, required intubation and mechanical ventilation  . Amoxicillin Nausea And Vomiting  . Carvedilol Other (See Comments)    Sinus pause on telemetry >3 seconds. Longest one 9 sec. No AV nodal agent     Current Outpatient  Prescriptions  Medication Sig Dispense Refill  . albuterol (PROVENTIL HFA;VENTOLIN HFA) 108 (90 BASE) MCG/ACT inhaler Inhale 2 puffs into the lungs every 6 (six) hours as needed for wheezing or shortness of breath. 3 Inhaler 1  . amLODipine (NORVASC) 5 MG tablet Take 1 tablet (5 mg total) by mouth daily. 30 tablet 3  . aspirin EC 81 MG tablet Take 81 mg by mouth daily.    . clopidogrel (PLAVIX) 75 MG tablet Take 1 tablet (75 mg total) by mouth daily. (Patient not taking: Reported on 07/18/2015) 90 tablet 3  . CRESTOR 20 MG tablet TAKE 1 Tablet BY MOUTH EVERY NIGHT AT BEDTIME FOR CHOLESTEROL  1  . ibuprofen (ADVIL,MOTRIN) 200 MG tablet Take 800 mg by mouth every 6 (six) hours as needed.    Marland Kitchen LORazepam (ATIVAN) 1 MG tablet Take one every 12 hours as needed for anxiety 20 tablet 0  . Menthol, Topical Analgesic, (BENGAY EX) Apply 1 application topically daily.    . mometasone-formoterol (DULERA) 100-5 MCG/ACT AERO Inhale 2 puffs into the lungs 2 (two) times daily. 3 Inhaler 1  . Multiple Vitamins-Minerals (MULTIVITAMIN MEN PO) Take by mouth daily.    . nicotine (NICODERM CQ - DOSED IN MG/24 HOURS) 21 mg/24hr patch Place 1 patch (21 mg total) onto the skin daily. 28 patch 0  . nitroGLYCERIN (  NITROSTAT) 0.4 MG SL tablet Place 1 tablet (0.4 mg total) under the tongue every 5 (five) minutes as needed for chest pain. 25 tablet 3  . sodium chloride (OCEAN) 0.65 % SOLN nasal spray Place 1 spray into both nostrils as needed for congestion.    . ticagrelor (BRILINTA) 90 MG TABS tablet Take 90 mg by mouth 2 (two) times daily.     No current facility-administered medications for this visit.     Past Surgical History  Procedure Laterality Date  . Incision / drainage hand / finger    . Cardiac catheterization N/A 02/06/2015    Procedure: Left Heart Cath and Coronary Angiography;  Surgeon: Leonie Man, MD;  Location: [facility];  Service: Cardiovascular;  Laterality: N/A;  . Cardiac catheterization  N/A 02/06/2015    Procedure: Coronary Stent Intervention;  Surgeon: Leonie Man, MD;  Location: [facility];  Service: Cardiovascular;  Laterality: N/A;     Allergies  Allergen Reactions  . Lisinopril Shortness Of Breath and Swelling    Angioedema, required intubation and mechanical ventilation  . Amoxicillin Nausea And Vomiting  . Carvedilol Other (See Comments)    Sinus pause on telemetry >3 seconds. Longest one 9 sec. No AV nodal agent      Family History  Problem Relation Age of Onset  . Heart attack Father   . Stroke Father      Social History Randy Hebert reports that he has been smoking Cigarettes.  He started smoking about 36 years ago. He has a 23 pack-year smoking history. He has quit using smokeless tobacco. His smokeless tobacco use included Chew. Randy Hebert reports that he drinks about 3.0 - 3.6 oz of alcohol per week.   Review of Systems CONSTITUTIONAL: No weight loss, fever, chills, weakness or fatigue.  HEENT: Eyes: No visual loss, blurred vision, double vision or yellow sclerae.No hearing loss, sneezing, congestion, runny nose or sore throat.  SKIN: No rash or itching.  CARDIOVASCULAR: per hpi RESPIRATORY: No shortness of breath, cough or sputum.  GASTROINTESTINAL: No anorexia, nausea, vomiting or diarrhea. No abdominal pain or blood.  GENITOURINARY: No burning on urination, no polyuria NEUROLOGICAL: No headache, dizziness, syncope, paralysis, ataxia, numbness or tingling in the extremities. No change in bowel or bladder control.  MUSCULOSKELETAL: No muscle, back pain, joint pain or stiffness.  LYMPHATICS: No enlarged nodes. No history of splenectomy.  PSYCHIATRIC: No history of depression or anxiety.  ENDOCRINOLOGIC: No reports of sweating, cold or heat intolerance. No polyuria or polydipsia.  Marland Kitchen   Physical Examination Filed Vitals:   08/03/15 1159  BP: 124/84  Pulse: 72   Filed Vitals:   08/03/15 1159  Height: '5\' 5"'$  (1.651 m)  Weight: 220  lb (99.791 kg)    Gen: resting comfortably, no acute distress HEENT: no scleral icterus, pupils equal round and reactive, no palptable cervical adenopathy,  CV: RRR, no m/r/g, no jvd Resp: Clear to auscultation bilaterally GI: abdomen is soft, non-tender, non-distended, normal bowel sounds, no hepatosplenomegaly MSK: extremities are warm, no edema.  Skin: warm, no rash Neuro:  no focal deficits Psych: appropriate affect   Diagnostic Studies  01/2015 cath 1. 1st Diag lesion, 100% stenosed. A Promus Premier 2.5 mm x 16 mm drug-eluting stent was placed. There is a 0% residual stenosis post intervention. 2. Ramus lesion, 70% stenosed. 3. Mid RCA lesion, 40% stenosed. Dist RCA lesion, 45% stenosed. 4. Mild to moderately reduced LVEF with anterolateral and apical hypokinesis and elevated LVEDP  Abnormal anatomy with equal sized Diagonal and LAD branch but extensive RCA. Successful PCI of the First Diagonal Branch.  Recommendations:  Standard post radial cath/PCI TR band removal  Dual independent therapy for minimum one year.  Check 2-D echocardiogram to better assess EF  Add statin and beta blocker. Smoking cessation counseling. Cardiac Rehabilitation And Case Management Consultation  If hemodynamic stable, would consider fast-track discharge  Would consider noninvasive evaluation of the Ramus Intermedius lesion in the absence of any recurrent anginal pain.  01/2015 echo Study Conclusions  - Left ventricle: Moderately severe hypokinesis of mid anterolateral segment, apical lateral segment, mid/apical anterior segments. EF is 40%. The cavity size was normal. Wall thickness was increased in a pattern of mild LVH. - Aortic valve: Sclerosis without stenosis. There was no significant regurgitation. - Right ventricle: The cavity size was normal. Systolic function was normal.  07/2015 CT PE IMPRESSION: 1. Suboptimal contrast bolus timing in the pulmonary arterial  tree, poor in the lobar vessels other than the right lower lobe. No central pulmonary embolus. If there is continued suspicion of acute PE nuclear medicine V/Q scan may be most helpful. 2. Situs abnormality in the chest with right side aortic arch and descending aorta. Mirror image branching. Other visible situs including cardiac and upper abdominal situs appear normal. 3. Calcified Coronary artery atherosclerosis. Hepatomegaly with fatty liver disease.  07/2015 PFTs: mild to mod ventilatory defect with small airway obstruction, moderately reduced DLCO.   Assessment and Plan   1. CAD - occasinal atypical chest pain - I suspect his SOB is more related to COPD as opposed to ongoing ischemia.  - continue current meds  2. COPD - encouarged increased compliance with inhalers, he is only taking his dulera once daily.  - pending symptoms may need consideration for pulm  Consult  3. Hyperlipideima - reports anxiety on crestor, we will try pravastatin '20mg'$  daily.   4. Deconditioning - significant inactivity, deconditioning, and weight gain since his MI - will refer to cardiac rehab  F/u 1 month  Arnoldo Lenis, M.D.

## 2015-08-03 NOTE — Patient Instructions (Signed)
Your physician recommends that you schedule a follow-up appointment in: 1 month DrBranch    STOP Crestor   START Pravastatin 20 mg at dinner   START Imdur 15 mg daily   You have been referred to cardiac rehab  Thank you for choosing [facility] !

## 2015-08-09 ENCOUNTER — Ambulatory Visit (INDEPENDENT_AMBULATORY_CARE_PROVIDER_SITE_OTHER): Payer: Self-pay | Admitting: Otolaryngology

## 2015-08-09 DIAGNOSIS — R490 Dysphonia: Secondary | ICD-10-CM

## 2015-08-09 DIAGNOSIS — J382 Nodules of vocal cords: Secondary | ICD-10-CM

## 2015-08-15 ENCOUNTER — Ambulatory Visit: Payer: Self-pay | Admitting: Physician Assistant

## 2015-08-16 ENCOUNTER — Ambulatory Visit: Payer: Self-pay | Admitting: Physician Assistant

## 2015-08-16 ENCOUNTER — Encounter: Payer: Self-pay | Admitting: Physician Assistant

## 2015-08-16 VITALS — BP 130/90 | HR 93 | Temp 97.3°F | Resp 22 | Ht 65.0 in | Wt 220.2 lb

## 2015-08-16 DIAGNOSIS — J449 Chronic obstructive pulmonary disease, unspecified: Secondary | ICD-10-CM

## 2015-08-16 DIAGNOSIS — E669 Obesity, unspecified: Secondary | ICD-10-CM

## 2015-08-16 DIAGNOSIS — I10 Essential (primary) hypertension: Secondary | ICD-10-CM

## 2015-08-16 DIAGNOSIS — F17219 Nicotine dependence, cigarettes, with unspecified nicotine-induced disorders: Secondary | ICD-10-CM

## 2015-08-16 DIAGNOSIS — E785 Hyperlipidemia, unspecified: Secondary | ICD-10-CM

## 2015-08-16 MED ORDER — ATORVASTATIN CALCIUM 20 MG PO TABS: 20.0000 mg | ORAL_TABLET | Freq: Every day | ORAL | Status: DC

## 2015-08-16 NOTE — Progress Notes (Signed)
BP 140/92 mmHg  Pulse 93  Temp(Src) 97.3 F (36.3 C)  Resp 22  Ht '5\' 5"'$  (1.651 m)  Wt 220 lb 3.2 oz (99.882 kg)  BMI 36.64 kg/m2  SpO2 94%   Subjective:    Patient ID: Randy Hebert, male    DOB: 06/24/1966, 49 y.o.   MRN: [id number]  HPI: Randy Hebert is a 49 y.o. male presenting on 08/16/2015 for Hypertension and Shortness of Breath   HPI   Pt is not taking pravastatin.  His statin was changed to pravastatin by dr [name] due to pt believing that his crestor was causing anxiety.   Rx was sent to [facility] but this medication is not available through [facility].   Pt saw Dr Benjamine Mola for ENT evaulatuion.  Pt says Dr Benjamine Mola referred him for evaluation by [facility] because of "vocal cords laying over each other the wrong way".  Pt says he doesn't have an appointment there yet because he has been talking with the financial assistance office at [facility].  Pt has gone back to work at [employer].  Pt is still smoking. He is not using his nicotine patches.  He has noticed a correlation between his smoking and his sob.  He plans to not buy more when he finishes the pack he has now.  He says he hates to waste the money (when suggested to throw away pack he has).  He is doing better with using the dulera twice daily and says it is helping and he is not needing his albuterol as often.  Relevant past medical, surgical, family and social history reviewed and updated as indicated. Interim medical history since our last visit reviewed. Allergies and medications reviewed and updated.  Current outpatient prescriptions:  .  albuterol (PROVENTIL HFA;VENTOLIN HFA) 108 (90 BASE) MCG/ACT inhaler, Inhale 2 puffs into the lungs every 6 (six) hours as needed for wheezing or shortness of breath., Disp: 3 Inhaler, Rfl: 1 .  amLODipine (NORVASC) 5 MG tablet, Take 1 tablet (5 mg total) by mouth daily., Disp: 30 tablet, Rfl: 3 .  aspirin EC 81 MG tablet, Take 81 mg by mouth daily., Disp: , Rfl:  .  clopidogrel (PLAVIX)  75 MG tablet, Take 1 tablet (75 mg total) by mouth daily., Disp: 90 tablet, Rfl: 3 .  ibuprofen (ADVIL,MOTRIN) 200 MG tablet, Take 800 mg by mouth every 6 (six) hours as needed., Disp: , Rfl:  .  isosorbide mononitrate (IMDUR) 30 MG 24 hr tablet, Take 0.5 tablets (15 mg total) by mouth daily., Disp: 45 tablet, Rfl: 3 .  LORazepam (ATIVAN) 1 MG tablet, Take one every 12 hours as needed for anxiety, Disp: 20 tablet, Rfl: 0 .  Menthol, Topical Analgesic, (BENGAY EX), Apply 1 application topically daily., Disp: , Rfl:  .  mometasone-formoterol (DULERA) 100-5 MCG/ACT AERO, Inhale 2 puffs into the lungs 2 (two) times daily., Disp: 3 Inhaler, Rfl: 1 .  Multiple Vitamins-Minerals (MULTIVITAMIN MEN PO), Take by mouth daily., Disp: , Rfl:  .  nitroGLYCERIN (NITROSTAT) 0.4 MG SL tablet, Place 1 tablet (0.4 mg total) under the tongue every 5 (five) minutes as needed for chest pain., Disp: 25 tablet, Rfl: 3 .  sodium chloride (OCEAN) 0.65 % SOLN nasal spray, Place 1 spray into both nostrils as needed for congestion., Disp: , Rfl:  .  pravastatin (PRAVACHOL) 20 MG tablet, Take 1 tablet (20 mg total) by mouth every evening. (Patient not taking: Reported on 08/16/2015), Disp: 90 tablet, Rfl: 3   Review  of Systems  Constitutional: Positive for diaphoresis and unexpected weight change. Negative for fever, chills, appetite change and fatigue.  HENT: Positive for dental problem and sneezing. Negative for congestion, drooling, ear pain, facial swelling, hearing loss, mouth sores, sore throat, trouble swallowing and voice change.   Eyes: Negative for pain, discharge, redness, itching and visual disturbance.  Respiratory: Positive for shortness of breath and wheezing. Negative for cough and choking.   Cardiovascular: Negative for chest pain, palpitations and leg swelling.  Gastrointestinal: Negative for vomiting, abdominal pain, diarrhea, constipation and blood in stool.  Endocrine: Positive for heat intolerance.  Negative for cold intolerance and polydipsia.  Genitourinary: Negative for dysuria, hematuria and decreased urine volume.  Musculoskeletal: Negative for back pain, arthralgias and gait problem.  Skin: Negative for rash.  Allergic/Immunologic: Negative for environmental allergies.  Neurological: Negative for seizures, syncope, light-headedness and headaches.  Hematological: Negative for adenopathy.  Psychiatric/Behavioral: Positive for agitation. Negative for suicidal ideas and dysphoric mood. The patient is not nervous/anxious.     Per HPI unless specifically indicated above     Objective:    BP 140/92 mmHg  Pulse 93  Temp(Src) 97.3 F (36.3 C)  Resp 22  Ht '5\' 5"'$  (1.651 m)  Wt 220 lb 3.2 oz (99.882 kg)  BMI 36.64 kg/m2  SpO2 94%  Wt Readings from Last 3 Encounters:  08/16/15 220 lb 3.2 oz (99.882 kg)  08/03/15 220 lb (99.791 kg)  07/18/15 223 lb 14.4 oz (101.56 kg)    BP recheck 130/90   Physical Exam  Constitutional: He is oriented to person, place, and time. He appears well-developed and well-nourished.  HENT:  Head: Normocephalic and atraumatic.  Neck: Neck supple.  Cardiovascular: Normal rate and regular rhythm.   Pulmonary/Chest: Effort normal and breath sounds normal. He has no wheezes.  Abdominal: Soft. Bowel sounds are normal. There is no tenderness.  Musculoskeletal: He exhibits no edema.  Lymphadenopathy:    He has no cervical adenopathy.  Neurological: He is alert and oriented to person, place, and time.  Skin: Skin is warm and dry.  Psychiatric: He has a normal mood and affect. His behavior is normal.  Vitals reviewed.       Assessment & Plan:   Encounter Diagnoses  Name Primary?  . Chronic obstructive pulmonary disease, unspecified COPD type ([place]) Yes  . Cigarette nicotine dependence with nicotine-induced disorder   . Hyperlipidemia   . Obesity, unspecified   . Essential hypertension    -rx lipitor sent to [facility] -Pt again counseled on  smoking cessation -f/u 1 mo. rto sooner prn

## 2015-08-27 DIAGNOSIS — E782 Mixed hyperlipidemia: Secondary | ICD-10-CM | POA: Insufficient documentation

## 2015-08-27 DIAGNOSIS — E6609 Other obesity due to excess calories: Secondary | ICD-10-CM | POA: Insufficient documentation

## 2015-08-27 DIAGNOSIS — J449 Chronic obstructive pulmonary disease, unspecified: Secondary | ICD-10-CM | POA: Insufficient documentation

## 2015-08-27 DIAGNOSIS — J4489 Other specified chronic obstructive pulmonary disease: Secondary | ICD-10-CM | POA: Insufficient documentation

## 2015-08-27 DIAGNOSIS — Z6835 Body mass index (BMI) 35.0-35.9, adult: Secondary | ICD-10-CM

## 2015-08-27 DIAGNOSIS — F17219 Nicotine dependence, cigarettes, with unspecified nicotine-induced disorders: Secondary | ICD-10-CM | POA: Insufficient documentation

## 2015-09-07 ENCOUNTER — Ambulatory Visit: Payer: Self-pay | Admitting: Cardiology

## 2015-09-14 ENCOUNTER — Ambulatory Visit (INDEPENDENT_AMBULATORY_CARE_PROVIDER_SITE_OTHER): Payer: Self-pay | Admitting: Cardiology

## 2015-09-14 ENCOUNTER — Encounter: Payer: Self-pay | Admitting: Cardiology

## 2015-09-14 VITALS — BP 146/80 | HR 97 | Ht 65.0 in | Wt 225.0 lb

## 2015-09-14 DIAGNOSIS — E785 Hyperlipidemia, unspecified: Secondary | ICD-10-CM

## 2015-09-14 DIAGNOSIS — I2510 Atherosclerotic heart disease of native coronary artery without angina pectoris: Secondary | ICD-10-CM

## 2015-09-14 NOTE — Progress Notes (Signed)
Patient ID: EUNICE OLDAKER, male   DOB: 1966/07/05, 50 y.o.   MRN: [id number]     Clinical Summary Mr. Chaddock is a 50 y.o.male seen today for follow up of the following medical problems.   1. CAD - admit 01/2015 with lateral STEMI, received DES to D1. There was a 70% ramus and 40% mid RCA that was medically managed - 01/2015 echo LVEF 40%, lateral wall hypokinesis - no beta blocker due to history of sinus pause and bradycardia. No ACE-I given history of severe angioedema, no ARB due to risk of cross reactivity  - can get mild sharp pain left chest. Not positional. Lasts just a few seconds. No other chest pain.    2. SOB/COPD - 07/2015 PFTs: mild to mod ventilatory defect with small airway obstruction.  - 07/2015 CT PE no PE, though suboptimal study - 07/2015 CXR no acute process.  PFTs with mild to mod vent defect, ABGs showed borderline resting hypoxemia.   - since last visit breathing has improved some. He has increased his compliance with his inhalers.   3. Hyperlipidemia - he reports anxiety on crestor, he stopped taking.  - he has had troubles affording alternative statin, awiating on free clinic assistance.    Past Medical History  Diagnosis Date  . Neck pain   . Otitis media   . Pleurisy   . CAD (coronary artery disease)     lateral STEMI 02/06/2015 00% D1 occlusion treated with Promus Premier 2.5 mm x 16 mm DES, 70% ramus stenosis, 40% mid RCA stenosis, 45% distal RCA stenosis, EF 45-50%  . Sinus pause     9 sec sinus pause on telemetry after started on coreg after MI, avoid AV nodal blocking agent  . MI (myocardial infarction) ([place])      Allergies  Allergen Reactions  . Lisinopril Shortness Of Breath and Swelling    Angioedema, required intubation and mechanical ventilation  . Amoxicillin Nausea And Vomiting  . Carvedilol Other (See Comments)    Sinus pause on telemetry >3 seconds. Longest one 9 sec. No AV nodal agent     Current Outpatient Prescriptions    Medication Sig Dispense Refill  . albuterol (PROVENTIL HFA;VENTOLIN HFA) 108 (90 BASE) MCG/ACT inhaler Inhale 2 puffs into the lungs every 6 (six) hours as needed for wheezing or shortness of breath. 3 Inhaler 1  . amLODipine (NORVASC) 5 MG tablet Take 1 tablet (5 mg total) by mouth daily. 30 tablet 3  . aspirin EC 81 MG tablet Take 81 mg by mouth daily.    Marland Kitchen atorvastatin (LIPITOR) 20 MG tablet Take 1 tablet (20 mg total) by mouth daily. 90 tablet 2  . clopidogrel (PLAVIX) 75 MG tablet Take 1 tablet (75 mg total) by mouth daily. 90 tablet 3  . ibuprofen (ADVIL,MOTRIN) 200 MG tablet Take 800 mg by mouth every 6 (six) hours as needed.    . isosorbide mononitrate (IMDUR) 30 MG 24 hr tablet Take 0.5 tablets (15 mg total) by mouth daily. 45 tablet 3  . LORazepam (ATIVAN) 1 MG tablet Take one every 12 hours as needed for anxiety 20 tablet 0  . Menthol, Topical Analgesic, (BENGAY EX) Apply 1 application topically daily.    . mometasone-formoterol (DULERA) 100-5 MCG/ACT AERO Inhale 2 puffs into the lungs 2 (two) times daily. 3 Inhaler 1  . Multiple Vitamins-Minerals (MULTIVITAMIN MEN PO) Take by mouth daily.    . nitroGLYCERIN (NITROSTAT) 0.4 MG SL tablet Place 1 tablet (0.4 mg total) under  the tongue every 5 (five) minutes as needed for chest pain. 25 tablet 3  . pravastatin (PRAVACHOL) 20 MG tablet Take 1 tablet (20 mg total) by mouth every evening. (Patient not taking: Reported on 08/16/2015) 90 tablet 3  . sodium chloride (OCEAN) 0.65 % SOLN nasal spray Place 1 spray into both nostrils as needed for congestion.     No current facility-administered medications for this visit.     Past Surgical History  Procedure Laterality Date  . Incision / drainage hand / finger    . Cardiac catheterization N/A 02/06/2015    Procedure: Left Heart Cath and Coronary Angiography;  Surgeon: Leonie Man, MD;  Location: [facility];  Service: Cardiovascular;  Laterality: N/A;  . Cardiac catheterization N/A  02/06/2015    Procedure: Coronary Stent Intervention;  Surgeon: Leonie Man, MD;  Location: [facility];  Service: Cardiovascular;  Laterality: N/A;     Allergies  Allergen Reactions  . Lisinopril Shortness Of Breath and Swelling    Angioedema, required intubation and mechanical ventilation  . Amoxicillin Nausea And Vomiting  . Carvedilol Other (See Comments)    Sinus pause on telemetry >3 seconds. Longest one 9 sec. No AV nodal agent      Family History  Problem Relation Age of Onset  . Heart attack Father   . Stroke Father      Social History Mr. Wadlow reports that he has been smoking Cigarettes.  He started smoking about 36 years ago. He has a 23 pack-year smoking history. He has quit using smokeless tobacco. His smokeless tobacco use included Chew. Mr. Rideaux reports that he drinks about 3.0 - 3.6 oz of alcohol per week.   Review of Systems CONSTITUTIONAL: No weight loss, fever, chills, weakness or fatigue.  HEENT: Eyes: No visual loss, blurred vision, double vision or yellow sclerae.No hearing loss, sneezing, congestion, runny nose or sore throat.  SKIN: No rash or itching.  CARDIOVASCULAR: per hpi RESPIRATORY: No cough or sputum.  GASTROINTESTINAL: No anorexia, nausea, vomiting or diarrhea. No abdominal pain or blood.  GENITOURINARY: No burning on urination, no polyuria NEUROLOGICAL: No headache, dizziness, syncope, paralysis, ataxia, numbness or tingling in the extremities. No change in bowel or bladder control.  MUSCULOSKELETAL: No muscle, back pain, joint pain or stiffness.  LYMPHATICS: No enlarged nodes. No history of splenectomy.  PSYCHIATRIC: No history of depression or anxiety.  ENDOCRINOLOGIC: No reports of sweating, cold or heat intolerance. No polyuria or polydipsia.  Marland Kitchen   Physical Examination Filed Vitals:   09/14/15 1548  BP: 146/80  Pulse: 97   Filed Vitals:   09/14/15 1548  Height: '5\' 5"'$  (1.651 m)  Weight: 225 lb (102.059 kg)     Gen: resting comfortably, no acute distress HEENT: no scleral icterus, pupils equal round and reactive, no palptable cervical adenopathy,  CV: RRR, no m/r/g, no jvd Resp: Clear to auscultation bilaterally GI: abdomen is soft, non-tender, non-distended, normal bowel sounds, no hepatosplenomegaly MSK: extremities are warm, no edema.  Skin: warm, no rash Neuro:  no focal deficits Psych: appropriate affect   Diagnostic Studies  01/2015 cath 1. 1st Diag lesion, 100% stenosed. A Promus Premier 2.5 mm x 16 mm drug-eluting stent was placed. There is a 0% residual stenosis post intervention. 2. Ramus lesion, 70% stenosed. 3. Mid RCA lesion, 40% stenosed. Dist RCA lesion, 45% stenosed. 4. Mild to moderately reduced LVEF with anterolateral and apical hypokinesis and elevated LVEDP  Abnormal anatomy with equal sized Diagonal and  LAD Celie Desrochers but extensive RCA. Successful PCI of the First Diagonal Tamy Accardo.  Recommendations:  Standard post radial cath/PCI TR band removal  Dual independent therapy for minimum one year.  Check 2-D echocardiogram to better assess EF  Add statin and beta blocker. Smoking cessation counseling. Cardiac Rehabilitation And Case Management Consultation  If hemodynamic stable, would consider fast-track discharge  Would consider noninvasive evaluation of the Ramus Intermedius lesion in the absence of any recurrent anginal pain.  01/2015 echo Study Conclusions  - Left ventricle: Moderately severe hypokinesis of mid anterolateral segment, apical lateral segment, mid/apical anterior segments. EF is 40%. The cavity size was normal. Wall thickness was increased in a pattern of mild LVH. - Aortic valve: Sclerosis without stenosis. There was no significant regurgitation. - Right ventricle: The cavity size was normal. Systolic function was normal.  07/2015 CT PE IMPRESSION: 1. Suboptimal contrast bolus timing in the pulmonary arterial tree, poor in the  lobar vessels other than the right lower lobe. No central pulmonary embolus. If there is continued suspicion of acute PE nuclear medicine V/Q scan may be most helpful. 2. Situs abnormality in the chest with right side aortic arch and descending aorta. Mirror image branching. Other visible situs including cardiac and upper abdominal situs appear normal. 3. Calcified Coronary artery atherosclerosis. Hepatomegaly with fatty liver disease.  07/2015 PFTs: mild to mod ventilatory defect with small airway obstruction, moderately reduced DLCO.    Assessment and Plan   1. CAD - occasional atypical chest pain, noncardiac in description - continue current meds, including DAPT at least until 01/2016  2. COPD - followed bypcp  3. Hyperlipideima - reports anxiety on crestor, he stopped taking. Has had trouble affording alternative statin, he is going to the free clinic on Monday to discuss assistance.    F/u 6 months     Arnoldo Lenis, M.D.

## 2015-09-14 NOTE — Patient Instructions (Signed)
Your physician wants you to follow-up in: 6 months with dr Bryna Colander will receive a reminder letter in the mail two months in advance. If you don't receive a letter, please call our office to schedule the follow-up appointment.    Your physician recommends that you continue on your current medications as directed. Please refer to the Current Medication list given to you today.    If you need a refill on your cardiac medications before your next appointment, please call your pharmacy.     Thank you for choosing [facility] !

## 2015-09-27 ENCOUNTER — Encounter: Payer: Self-pay | Admitting: Physician Assistant

## 2015-09-27 ENCOUNTER — Ambulatory Visit: Payer: Self-pay | Admitting: Physician Assistant

## 2015-09-27 ENCOUNTER — Ambulatory Visit (HOSPITAL_COMMUNITY)
Admission: RE | Admit: 2015-09-27 | Discharge: 2015-09-27 | Disposition: A | Discharge status: Home / Self Care | Payer: Self-pay | Source: Ambulatory Visit | Attending: Physician Assistant | Admitting: Physician Assistant

## 2015-09-27 VITALS — BP 138/84 | HR 86 | Temp 97.9°F | Ht 65.0 in | Wt 227.2 lb

## 2015-09-27 DIAGNOSIS — J449 Chronic obstructive pulmonary disease, unspecified: Secondary | ICD-10-CM

## 2015-09-27 DIAGNOSIS — M1611 Unilateral primary osteoarthritis, right hip: Secondary | ICD-10-CM | POA: Insufficient documentation

## 2015-09-27 DIAGNOSIS — E785 Hyperlipidemia, unspecified: Secondary | ICD-10-CM

## 2015-09-27 DIAGNOSIS — Z125 Encounter for screening for malignant neoplasm of prostate: Secondary | ICD-10-CM

## 2015-09-27 DIAGNOSIS — F17219 Nicotine dependence, cigarettes, with unspecified nicotine-induced disorders: Secondary | ICD-10-CM

## 2015-09-27 DIAGNOSIS — M25551 Pain in right hip: Secondary | ICD-10-CM

## 2015-09-27 DIAGNOSIS — I10 Essential (primary) hypertension: Secondary | ICD-10-CM

## 2015-09-27 MED ORDER — NITROGLYCERIN 0.4 MG SL SUBL: 0.4000 mg | SUBLINGUAL_TABLET | SUBLINGUAL | Status: DC | PRN

## 2015-09-27 NOTE — Progress Notes (Signed)
BP 138/84 mmHg  Pulse 86  Temp(Src) 97.9 F (36.6 C)  Ht '5\' 5"'$  (1.651 m)  Wt 227 lb 3.2 oz (103.057 kg)  BMI 37.81 kg/m2  SpO2 93%   Subjective:    Patient ID: Randy Hebert, male    DOB: September 05, 1965, 50 y.o.   MRN: [id number]  HPI: Randy Hebert is a 50 y.o. male presenting on 09/27/2015 for COPD   HPI Chief Complaint  Patient presents with  . COPD    pt states he feels great   rx lipitor was sent at last OV. Pt states he hasn't received it yet.  Pt is still smoking  Pt c/o R hip pain.  States onset 1 year ago.    Relevant past medical, surgical, family and social history reviewed and updated as indicated. Interim medical history since our last visit reviewed. Allergies and medications reviewed and updated.   Current outpatient prescriptions:  .  albuterol (PROVENTIL HFA;VENTOLIN HFA) 108 (90 BASE) MCG/ACT inhaler, Inhale 2 puffs into the lungs every 6 (six) hours as needed for wheezing or shortness of breath., Disp: 3 Inhaler, Rfl: 1 .  amLODipine (NORVASC) 5 MG tablet, Take 1 tablet (5 mg total) by mouth daily., Disp: 30 tablet, Rfl: 3 .  aspirin EC 81 MG tablet, Take 81 mg by mouth daily., Disp: , Rfl:  .  atorvastatin (LIPITOR) 20 MG tablet, Take 1 tablet (20 mg total) by mouth daily., Disp: 90 tablet, Rfl: 2 .  clopidogrel (PLAVIX) 75 MG tablet, Take 1 tablet (75 mg total) by mouth daily., Disp: 90 tablet, Rfl: 3 .  ibuprofen (ADVIL,MOTRIN) 200 MG tablet, Take 800 mg by mouth every 6 (six) hours as needed., Disp: , Rfl:  .  isosorbide mononitrate (IMDUR) 30 MG 24 hr tablet, Take 0.5 tablets (15 mg total) by mouth daily., Disp: 45 tablet, Rfl: 3 .  LORazepam (ATIVAN) 1 MG tablet, Take one every 12 hours as needed for anxiety, Disp: 20 tablet, Rfl: 0 .  Menthol, Topical Analgesic, (BENGAY EX), Apply 1 application topically daily., Disp: , Rfl:  .  mometasone-formoterol (DULERA) 100-5 MCG/ACT AERO, Inhale 2 puffs into the lungs 2 (two) times daily., Disp: 3  Inhaler, Rfl: 1 .  nitroGLYCERIN (NITROSTAT) 0.4 MG SL tablet, Place 1 tablet (0.4 mg total) under the tongue every 5 (five) minutes as needed for chest pain., Disp: 25 tablet, Rfl: 3 .  sodium chloride (OCEAN) 0.65 % SOLN nasal spray, Place 1 spray into both nostrils as needed for congestion., Disp: , Rfl:  .  Multiple Vitamins-Minerals (MULTIVITAMIN MEN PO), Take by mouth daily. Reported on 09/27/2015, Disp: , Rfl:    Review of Systems  Constitutional: Negative for fever, chills, diaphoresis, appetite change, fatigue and unexpected weight change.  HENT: Positive for sneezing and voice change. Negative for congestion, dental problem, drooling, ear pain, facial swelling, hearing loss, mouth sores, sore throat and trouble swallowing.   Eyes: Negative for pain, discharge, redness, itching and visual disturbance.  Respiratory: Positive for shortness of breath and wheezing. Negative for cough and choking.   Cardiovascular: Negative for chest pain, palpitations and leg swelling.  Gastrointestinal: Negative for vomiting, abdominal pain, diarrhea, constipation and blood in stool.  Endocrine: Positive for heat intolerance. Negative for cold intolerance and polydipsia.  Genitourinary: Negative for dysuria, hematuria and decreased urine volume.  Musculoskeletal: Positive for back pain, arthralgias and gait problem.  Skin: Negative for rash.  Allergic/Immunologic: Negative for environmental allergies.  Neurological: Negative for seizures, syncope, light-headedness  and headaches.  Hematological: Negative for adenopathy.  Psychiatric/Behavioral: Positive for agitation. Negative for suicidal ideas and dysphoric mood. The patient is nervous/anxious.     Per HPI unless specifically indicated above     Objective:    BP 138/84 mmHg  Pulse 86  Temp(Src) 97.9 F (36.6 C)  Ht '5\' 5"'$  (1.651 m)  Wt 227 lb 3.2 oz (103.057 kg)  BMI 37.81 kg/m2  SpO2 93%  Wt Readings from Last 3 Encounters:  09/27/15 227 lb  3.2 oz (103.057 kg)  09/14/15 225 lb (102.059 kg)  08/16/15 220 lb 3.2 oz (99.882 kg)    Physical Exam  Constitutional: He is oriented to person, place, and time. He appears well-developed and well-nourished.  HENT:  Head: Normocephalic and atraumatic.  Neck: Neck supple.  Cardiovascular: Normal rate and regular rhythm.   Pulmonary/Chest: Effort normal and breath sounds normal. He has no wheezes.  Abdominal: Soft. Bowel sounds are normal. There is no hepatosplenomegaly. There is no tenderness.  Musculoskeletal: He exhibits no edema.  Lymphadenopathy:    He has no cervical adenopathy.  Neurological: He is alert and oriented to person, place, and time.  Skin: Skin is warm and dry.  Psychiatric: He has a normal mood and affect. His behavior is normal.  Vitals reviewed.       Assessment & Plan:    Encounter Diagnoses  Name Primary?  . Chronic obstructive pulmonary disease, unspecified COPD type ([place]) Yes  . Cigarette nicotine dependence with nicotine-induced disorder   . Right hip pain   . Screening for prostate cancer   . Hyperlipidemia   . Essential hypertension, benign     -counseled pt on Smoking cessation -will get R hip xray- pt states [facility] is still active -Check fasting lipids -f/u 3 months.  RTO sooner prn

## 2015-09-28 LAB — COMPLETE METABOLIC PANEL WITH GFR
ALT: 34 U/L (ref 9–46)
AST: 19 U/L (ref 10–35)
Albumin: 3.8 g/dL (ref 3.6–5.1)
Alkaline Phosphatase: 99 U/L (ref 40–115)
BUN: 19 mg/dL (ref 7–25)
CHLORIDE: 104 mmol/L (ref 98–110)
CO2: 30 mmol/L (ref 20–31)
Calcium: 9 mg/dL (ref 8.6–10.3)
Creat: 1.07 mg/dL (ref 0.70–1.33)
GFR, EST NON AFRICAN AMERICAN: 81 mL/min (ref >=60)
Glucose, Bld: 114 mg/dL — ABNORMAL HIGH (ref 65–99)
Potassium: 5 mmol/L (ref 3.5–5.3)
Sodium: 141 mmol/L (ref 135–146)
Total Bilirubin: 0.5 mg/dL (ref 0.2–1.2)
Total Protein: 6.6 g/dL (ref 6.1–8.1)

## 2015-09-28 LAB — LIPID PANEL
Cholesterol: 189 mg/dL (ref 125–200)
HDL: 25 mg/dL — AB (ref >=40)
LDL CALC: 115 mg/dL (ref <130)
Total CHOL/HDL Ratio: 7.6 Ratio — ABNORMAL HIGH (ref <=5.0)
Triglycerides: 245 mg/dL — ABNORMAL HIGH (ref <150)
VLDL: 49 mg/dL — ABNORMAL HIGH (ref <30)

## 2015-09-29 LAB — PSA: PSA: 0.63 ng/mL (ref <=4.00)

## 2015-10-20 ENCOUNTER — Encounter (HOSPITAL_COMMUNITY): Payer: Self-pay | Admitting: Emergency Medicine

## 2015-10-20 ENCOUNTER — Emergency Department (HOSPITAL_COMMUNITY)
Admission: EM | Admit: 2015-10-20 | Discharge: 2015-10-20 | Disposition: A | Discharge status: Home / Self Care | Payer: MEDICAID | Attending: Emergency Medicine | Admitting: Emergency Medicine

## 2015-10-20 DIAGNOSIS — I2510 Atherosclerotic heart disease of native coronary artery without angina pectoris: Secondary | ICD-10-CM | POA: Insufficient documentation

## 2015-10-20 DIAGNOSIS — F17210 Nicotine dependence, cigarettes, uncomplicated: Secondary | ICD-10-CM | POA: Insufficient documentation

## 2015-10-20 DIAGNOSIS — M1611 Unilateral primary osteoarthritis, right hip: Secondary | ICD-10-CM

## 2015-10-20 DIAGNOSIS — I252 Old myocardial infarction: Secondary | ICD-10-CM | POA: Insufficient documentation

## 2015-10-20 MED ORDER — HYDROCODONE-ACETAMINOPHEN 5-325 MG PO TABS: 2.0000 | ORAL_TABLET | ORAL | Status: DC | PRN

## 2015-10-20 NOTE — Discharge Instructions (Signed)

## 2015-10-20 NOTE — ED Provider Notes (Signed)
CSN: [id number]     Arrival date & time 10/20/15  16 History   First MD Initiated Contact with Patient 10/20/15 1116     Chief Complaint  Patient presents with  . Hip Pain     (Consider location/radiation/quality/duration/timing/severity/associated sxs/prior Treatment) Patient is a 50 y.o. male presenting with hip pain. The history is provided by the patient. No language interpreter was used.  Hip Pain This is a new problem. The current episode started more than 1 month ago. The problem occurs constantly. The problem has been gradually worsening. Associated symptoms comments: Pain down leg. Nothing aggravates the symptoms. He has tried nothing for the symptoms. The treatment provided moderate relief.  Pt complains of pain in his right hip for several weeks.  Pt goes to the clinic.   Pt is suppose to be referred to Orthopaedist.  Pt requesting medication for pain  Past Medical History  Diagnosis Date  . Neck pain   . Otitis media   . Pleurisy   . CAD (coronary artery disease)     lateral STEMI 02/06/2015 00% D1 occlusion treated with Promus Premier 2.5 mm x 16 mm DES, 70% ramus stenosis, 40% mid RCA stenosis, 45% distal RCA stenosis, EF 45-50%  . Sinus pause     9 sec sinus pause on telemetry after started on coreg after MI, avoid AV nodal blocking agent  . MI (myocardial infarction) [facility])    Past Surgical History  Procedure Laterality Date  . Incision / drainage hand / finger    . Cardiac catheterization N/A 02/06/2015    Procedure: Left Heart Cath and Coronary Angiography;  Surgeon: Leonie Man, MD;  Location: [facility];  Service: Cardiovascular;  Laterality: N/A;  . Cardiac catheterization N/A 02/06/2015    Procedure: Coronary Stent Intervention;  Surgeon: Leonie Man, MD;  Location: [facility];  Service: Cardiovascular;  Laterality: N/A;   Family History  Problem Relation Age of Onset  . Heart attack Father   . Stroke Father    Social History  Substance  Use Topics  . Smoking status: Current Some Day Smoker -- 0.50 packs/day for 46 years    Types: Cigarettes    Start date: 06/07/1979  . Smokeless tobacco: Former Systems developer    Types: Chew  . Alcohol Use: 3.0 - 3.6 oz/week    5-6 Shots of liquor, 0 Standard drinks or equivalent per week    Review of Systems  All other systems reviewed and are negative.     Allergies  Lisinopril; Amoxicillin; and Carvedilol  Home Medications   Prior to Admission medications   Medication Sig Start Date End Date Taking? Authorizing Provider  albuterol (PROVENTIL HFA;VENTOLIN HFA) 108 (90 BASE) MCG/ACT inhaler Inhale 2 puffs into the lungs every 6 (six) hours as needed for wheezing or shortness of breath. 07/18/15  Yes Soyla Dryer, PA-C  amLODipine (NORVASC) 5 MG tablet Take 1 tablet (5 mg total) by mouth daily. 06/27/15  Yes Soyla Dryer, PA-C  aspirin EC 81 MG tablet Take 81 mg by mouth daily.   Yes Historical Provider, MD  clopidogrel (PLAVIX) 75 MG tablet Take 1 tablet (75 mg total) by mouth daily. 07/11/15  Yes Imogene Burn, PA-C  ibuprofen (ADVIL,MOTRIN) 200 MG tablet Take 800 mg by mouth every 6 (six) hours as needed for moderate pain.    Yes Historical Provider, MD  isosorbide mononitrate (IMDUR) 30 MG 24 hr tablet Take 0.5 tablets (15 mg total) by mouth daily. 08/03/15  Yes Arnoldo Lenis, MD  Menthol, Topical Analgesic, (BENGAY EX) Apply 1 application topically daily.   Yes Historical Provider, MD  mometasone-formoterol (DULERA) 100-5 MCG/ACT AERO Inhale 2 puffs into the lungs 2 (two) times daily. 07/18/15  Yes Soyla Dryer, PA-C  nitroGLYCERIN (NITROSTAT) 0.4 MG SL tablet Place 1 tablet (0.4 mg total) under the tongue every 5 (five) minutes as needed for chest pain. 02/09/15  Yes Almyra Deforest, PA  sodium chloride (OCEAN) 0.65 % SOLN nasal spray Place 1 spray into both nostrils as needed for congestion.   Yes Historical Provider, MD  HYDROcodone-acetaminophen (NORCO/VICODIN) 5-325 MG tablet  Take 2 tablets by mouth every 4 (four) hours as needed. 10/20/15   Hollace Kinnier Daxen Lanum, PA-C   BP 126/80 mmHg  Pulse 99  Temp(Src) 97.6 F (36.4 C) (Oral)  Resp 18  Ht '5\' 5"'$  (1.651 m)  Wt 102.059 kg  BMI 37.44 kg/m2  SpO2 95% Physical Exam  Constitutional: He appears well-developed and well-nourished.  HENT:  Head: Normocephalic.  Right Ear: External ear normal.  Left Ear: External ear normal.  Nose: Nose normal.  Mouth/Throat: Oropharynx is clear and moist.  Eyes: Conjunctivae and EOM are normal. Pupils are equal, round, and reactive to light.  Neck: Normal range of motion.  Cardiovascular: Normal rate and normal heart sounds.   Pulmonary/Chest: Effort normal and breath sounds normal.  Abdominal: Soft.  Musculoskeletal: He exhibits tenderness.  Tender right hip, good range of motion,  nv and ns intact  Neurological: He is alert.  Skin: Skin is warm.  Nursing note and vitals reviewed.   ED Course  Procedures (including critical care time) Labs Review Labs Reviewed - No data to display  Imaging Review No results found. I have personally reviewed and evaluated these images and lab results as part of my medical decision-making.   EKG Interpretation None      MDM Pt advised to follow up at clinic for orthopaedic referral   Final diagnoses:  Primary osteoarthritis of right hip    Meds ordered this encounter  Medications  . HYDROcodone-acetaminophen (NORCO/VICODIN) 5-325 MG tablet    Sig: Take 2 tablets by mouth every 4 (four) hours as needed.    Dispense:  16 tablet    Refill:  0    Order Specific Question:  Supervising Provider    Answer:  Noemi Chapel [[id number]]      Hollace Kinnier Ruma, PA-C 10/20/15 [place], MD 10/21/15 [phone]

## 2015-10-20 NOTE — ED Notes (Signed)
Patient c/o right hip pain. Per patient was seen by free clinic and sent here for x-rays. Patient told he has degenerative joint disease and is to be given a referral to orthopedic specialist. Patient was not given anything for pain. Patient reports taking motrin and tramadol with no relief.

## 2015-10-22 ENCOUNTER — Other Ambulatory Visit: Payer: Self-pay | Admitting: Physician Assistant

## 2015-10-22 DIAGNOSIS — M1611 Unilateral primary osteoarthritis, right hip: Secondary | ICD-10-CM

## 2015-10-22 DIAGNOSIS — M25551 Pain in right hip: Secondary | ICD-10-CM

## 2015-11-06 ENCOUNTER — Encounter (HOSPITAL_COMMUNITY): Payer: Self-pay | Admitting: Emergency Medicine

## 2015-11-06 ENCOUNTER — Emergency Department (HOSPITAL_COMMUNITY)
Admission: EM | Admit: 2015-11-06 | Discharge: 2015-11-06 | Disposition: A | Discharge status: Home / Self Care | Payer: Self-pay | Attending: Emergency Medicine | Admitting: Emergency Medicine

## 2015-11-06 DIAGNOSIS — M1611 Unilateral primary osteoarthritis, right hip: Secondary | ICD-10-CM | POA: Insufficient documentation

## 2015-11-06 DIAGNOSIS — Z79899 Other long term (current) drug therapy: Secondary | ICD-10-CM | POA: Insufficient documentation

## 2015-11-06 DIAGNOSIS — I252 Old myocardial infarction: Secondary | ICD-10-CM | POA: Insufficient documentation

## 2015-11-06 DIAGNOSIS — F17210 Nicotine dependence, cigarettes, uncomplicated: Secondary | ICD-10-CM | POA: Insufficient documentation

## 2015-11-06 DIAGNOSIS — I2510 Atherosclerotic heart disease of native coronary artery without angina pectoris: Secondary | ICD-10-CM | POA: Insufficient documentation

## 2015-11-06 DIAGNOSIS — Z7982 Long term (current) use of aspirin: Secondary | ICD-10-CM | POA: Insufficient documentation

## 2015-11-06 MED ORDER — HYDROCODONE-ACETAMINOPHEN 7.5-325 MG PO TABS: 1.0000 | ORAL_TABLET | Freq: Four times a day (QID) | ORAL | Status: DC | PRN

## 2015-11-06 NOTE — Discharge Instructions (Signed)
Osteoarthritis Osteoarthritis is a disease that causes soreness and inflammation of a joint. It occurs when the cartilage at the affected joint wears down. Cartilage acts as a cushion, covering the ends of bones where they meet to form a joint. Osteoarthritis is the most common form of arthritis. It often occurs in older people. The joints affected most often by this condition include those in the:  Ends of the fingers.  Thumbs.  Neck.  Lower back.  Knees.  Hips. CAUSES  Over time, the cartilage that covers the ends of bones begins to wear away. This causes bone to rub on bone, producing pain and stiffness in the affected joints.  RISK FACTORS Certain factors can increase your chances of having osteoarthritis, including:  Older age.  Excessive body weight.  Overuse of joints.  Previous joint injury. SIGNS AND SYMPTOMS   Pain, swelling, and stiffness in the joint.  Over time, the joint may lose its normal shape.  Small deposits of bone (osteophytes) may grow on the edges of the joint.  Bits of bone or cartilage can break off and float inside the joint space. This may cause more pain and damage. DIAGNOSIS  Your health care provider will do a physical exam and ask about your symptoms. Various tests may be ordered, such as:  X-rays of the affected joint.  Blood tests to rule out other types of arthritis. Additional tests may be used to diagnose your condition. TREATMENT  Goals of treatment are to control pain and improve joint function. Treatment plans may include:  A prescribed exercise program that allows for rest and joint relief.  A weight control plan.  Pain relief techniques, such as:  Properly applied heat and cold.  Electric pulses delivered to nerve endings under the skin (transcutaneous electrical nerve stimulation [TENS]).  Massage.  Certain nutritional supplements.  Medicines to control pain, such as:  Acetaminophen.  Nonsteroidal  anti-inflammatory drugs (NSAIDs), such as naproxen.  Narcotic or central-acting agents, such as tramadol.  Corticosteroids. These can be given orally or as an injection.  Surgery to reposition the bones and relieve pain (osteotomy) or to remove loose pieces of bone and cartilage. Joint replacement may be needed in advanced states of osteoarthritis. HOME CARE INSTRUCTIONS   Take medicines only as directed by your health care provider.  Maintain a healthy weight. Follow your health care provider's instructions for weight control. This may include dietary instructions.  Exercise as directed. Your health care provider can recommend specific types of exercise. These may include:  Strengthening exercises. These are done to strengthen the muscles that support joints affected by arthritis. They can be performed with weights or with exercise bands to add resistance.  Aerobic activities. These are exercises, such as brisk walking or low-impact aerobics, that get your heart pumping.  Range-of-motion activities. These keep your joints limber.  Balance and agility exercises. These help you maintain daily living skills.  Rest your affected joints as directed by your health care provider.  Keep all follow-up visits as directed by your health care provider. SEEK MEDICAL CARE IF:   Your skin turns red.  You develop a rash in addition to your joint pain.  You have worsening joint pain.  You have a fever along with joint or muscle aches. SEEK IMMEDIATE MEDICAL CARE IF:  You have a significant loss of weight or appetite.  You have night sweats. [place] of Arthritis and Musculoskeletal and Skin Diseases: www.niams.SouthExposed.es  National Institute on  Aging: http://kim-miller.com/  American College of Rheumatology: www.rheumatology.org   This information is not intended to replace advice given to you by your health care provider. Make sure you discuss any questions you  have with your health care provider.   Document Released: 08/18/2005 Document Revised: 09/08/2014 Document Reviewed: 04/25/2013 Elsevier Interactive Patient Education 2016 [place] may take the hydrocodone prescribed for pain relief.  This will make you drowsy - do not drive within 4 hours of taking this medication.

## 2015-11-06 NOTE — ED Notes (Signed)
Pt c/o chronic right hip pain radiating into right leg. Pt states he had xray x 1 month ago and was seen in ED again a few weeks ago. States he [facility].

## 2015-11-06 NOTE — ED Provider Notes (Signed)
CSN: [id number]     Arrival date & time 11/06/15  4098 History   First MD Initiated Contact with Patient 11/06/15 0805     Chief Complaint  Patient presents with  . Hip Pain     (Consider location/radiation/quality/duration/timing/severity/associated sxs/prior Treatment) The history is provided by the patient.   Randy Hebert is a 50 y.o. male presenting with acute on chronic right hip pain associated with severe djd diagnosed last month per imaging.  [facility] and is awaiting his [facility] coverage to be re-approved so he can be referred to an orthopedist as he is anticipating problem hip replacement surgery.  He has been taking ibuprofen 4-5 tablets every several times daily which is sometimes helpful, but then he has days with severe pain such as today.  He denies any new injury.  He has pain radiating into his right medial thigh.  He denies acid reflux or heartburn, denies urinary symptoms, denies weakness in his lower extremities.       Past Medical History  Diagnosis Date  . Neck pain   . Otitis media   . Pleurisy   . CAD (coronary artery disease)     lateral STEMI 02/06/2015 00% D1 occlusion treated with Promus Premier 2.5 mm x 16 mm DES, 70% ramus stenosis, 40% mid RCA stenosis, 45% distal RCA stenosis, EF 45-50%  . Sinus pause     9 sec sinus pause on telemetry after started on coreg after MI, avoid AV nodal blocking agent  . MI (myocardial infarction) [facility])    Past Surgical History  Procedure Laterality Date  . Incision / drainage hand / finger    . Cardiac catheterization N/A 02/06/2015    Procedure: Left Heart Cath and Coronary Angiography;  Surgeon: Leonie Man, MD;  Location: [place] [facility];  Service: Cardiovascular;  Laterality: N/A;  . Cardiac catheterization N/A 02/06/2015    Procedure: Coronary Stent Intervention;  Surgeon: Leonie Man, MD;  Location: [place] [facility];  Service: Cardiovascular;  Laterality: N/A;   Family  History  Problem Relation Age of Onset  . Heart attack Father   . Stroke Father    Social History  Substance Use Topics  . Smoking status: Current Some Day Smoker -- 0.50 packs/day for 46 years    Types: Cigarettes    Start date: 06/07/1979  . Smokeless tobacco: Former Systems developer    Types: Chew  . Alcohol Use: 3.0 - 3.6 oz/week    5-6 Shots of liquor, 0 Standard drinks or equivalent per week    Review of Systems  Constitutional: Negative for fever.  Musculoskeletal: Positive for arthralgias. Negative for myalgias and joint swelling.  Neurological: Negative for weakness and numbness.      Allergies  Lisinopril; Amoxicillin; and Carvedilol  Home Medications   Prior to Admission medications   Medication Sig Start Date End Date Taking? Authorizing Provider  albuterol (PROVENTIL HFA;VENTOLIN HFA) 108 (90 BASE) MCG/ACT inhaler Inhale 2 puffs into the lungs every 6 (six) hours as needed for wheezing or shortness of breath. 07/18/15   Soyla Dryer, PA-C  amLODipine (NORVASC) 5 MG tablet Take 1 tablet (5 mg total) by mouth daily. 06/27/15   Soyla Dryer, PA-C  aspirin EC 81 MG tablet Take 81 mg by mouth daily.    Historical Provider, MD  clopidogrel (PLAVIX) 75 MG tablet Take 1 tablet (75 mg total) by mouth daily. 07/11/15   Imogene Burn, PA-C  HYDROcodone-acetaminophen (  NORCO) 7.5-325 MG tablet Take 1 tablet by mouth every 6 (six) hours as needed for moderate pain. 11/06/15   Evalee Jefferson, PA-C  ibuprofen (ADVIL,MOTRIN) 200 MG tablet Take 800 mg by mouth every 6 (six) hours as needed for moderate pain.     Historical Provider, MD  isosorbide mononitrate (IMDUR) 30 MG 24 hr tablet Take 0.5 tablets (15 mg total) by mouth daily. 08/03/15   Arnoldo Lenis, MD  Menthol, Topical Analgesic, (BENGAY EX) Apply 1 application topically daily.    Historical Provider, MD  mometasone-formoterol (DULERA) 100-5 MCG/ACT AERO Inhale 2 puffs into the lungs 2 (two) times daily. 07/18/15   Soyla Dryer,  PA-C  nitroGLYCERIN (NITROSTAT) 0.4 MG SL tablet Place 1 tablet (0.4 mg total) under the tongue every 5 (five) minutes as needed for chest pain. 02/09/15   Almyra Deforest, PA  sodium chloride (OCEAN) 0.65 % SOLN nasal spray Place 1 spray into both nostrils as needed for congestion.    Historical Provider, MD   BP 143/89 mmHg  Pulse 98  Temp(Src) 98.3 F (36.8 C)  Resp 18  Ht '5\' 5"'$  (1.651 m)  Wt 97.523 kg  BMI 35.78 kg/m2  SpO2 97% Physical Exam  Constitutional: He appears well-developed and well-nourished.  HENT:  Head: Atraumatic.  Neck: Normal range of motion.  Cardiovascular:  Pulses:      Dorsalis pedis pulses are 2+ on the right side, and 2+ on the left side.  Pulses equal bilaterally  Musculoskeletal:       Right hip: He exhibits decreased range of motion. He exhibits normal strength, no swelling and no crepitus.  Decreased ROM external rotation.  Pain with movement.  Neurological: He is alert. He has normal strength. He displays normal reflexes. No sensory deficit.  Skin: Skin is warm and dry.  Psychiatric: He has a normal mood and affect.    ED Course  Procedures (including critical care time) Labs Review Labs Reviewed - No data to display  Imaging Review No results found. I have personally reviewed and evaluated these images and lab results as part of my medical decision-making.   EKG Interpretation None      MDM   Final diagnoses:  Primary osteoarthritis of right hip    Pt with acute on chronic right hip pain with severe djd per imaging last month which was reviewed. Hydrocodone prescribed.  Discussed maximum safe dose of ibuprofen which patient understands.  Advised adding Pepcid if he develops stomach upset, heartburn.  Prn follow-up with PCP as planned.    Evalee Jefferson, PA-C 11/06/15 2841  Virgel Manifold, MD 11/06/15 1332

## 2015-11-15 ENCOUNTER — Encounter (HOSPITAL_COMMUNITY): Payer: Self-pay | Admitting: Emergency Medicine

## 2015-11-15 ENCOUNTER — Emergency Department (HOSPITAL_COMMUNITY)
Admission: EM | Admit: 2015-11-15 | Discharge: 2015-11-15 | Disposition: A | Discharge status: Home / Self Care | Payer: Self-pay | Attending: Emergency Medicine | Admitting: Emergency Medicine

## 2015-11-15 DIAGNOSIS — I2510 Atherosclerotic heart disease of native coronary artery without angina pectoris: Secondary | ICD-10-CM | POA: Insufficient documentation

## 2015-11-15 DIAGNOSIS — G8929 Other chronic pain: Secondary | ICD-10-CM | POA: Insufficient documentation

## 2015-11-15 DIAGNOSIS — F17210 Nicotine dependence, cigarettes, uncomplicated: Secondary | ICD-10-CM | POA: Insufficient documentation

## 2015-11-15 DIAGNOSIS — M1611 Unilateral primary osteoarthritis, right hip: Secondary | ICD-10-CM | POA: Insufficient documentation

## 2015-11-15 DIAGNOSIS — Z79899 Other long term (current) drug therapy: Secondary | ICD-10-CM | POA: Insufficient documentation

## 2015-11-15 MED ORDER — HYDROCODONE-ACETAMINOPHEN 7.5-325 MG PO TABS: 1.0000 | ORAL_TABLET | Freq: Four times a day (QID) | ORAL | Status: DC | PRN

## 2015-11-15 NOTE — ED Provider Notes (Signed)
CSN: [id number]     Arrival date & time 11/15/15  0757 History   First MD Initiated Contact with Patient 11/15/15 [phone]     Chief Complaint  Patient presents with  . Hip Pain     (Consider location/radiation/quality/duration/timing/severity/associated sxs/prior Treatment) HPI   Randy Hebert is a 50 y.o. male who presents to the Emergency Department complaining of acute on chronic right hip pain. States pain has been present for more than one year but continues to worsen. He states that [facility] and was advised to come to the ED for pain control. He states that he is awaiting approval of disability or insurance coverage through [facility].  He was seen here earlier this month for the same. He denies new injury. He states pain is worse with weightbearing and rotation of the hip. He states at times the pain radiates into his right anterior thigh. He denies urine or bowel changes, fever, chills, weakness or numbness of the lower extremities.   Past Medical History  Diagnosis Date  . Neck pain   . Otitis media   . Pleurisy   . CAD (coronary artery disease)     lateral STEMI 02/06/2015 00% D1 occlusion treated with Promus Premier 2.5 mm x 16 mm DES, 70% ramus stenosis, 40% mid RCA stenosis, 45% distal RCA stenosis, EF 45-50%  . Sinus pause     9 sec sinus pause on telemetry after started on coreg after MI, avoid AV nodal blocking agent  . MI (myocardial infarction) [facility])    Past Surgical History  Procedure Laterality Date  . Incision / drainage hand / finger    . Cardiac catheterization N/A 02/06/2015    Procedure: Left Heart Cath and Coronary Angiography;  Surgeon: Leonie Man, MD;  Location: [place] [facility];  Service: Cardiovascular;  Laterality: N/A;  . Cardiac catheterization N/A 02/06/2015    Procedure: Coronary Stent Intervention;  Surgeon: Leonie Man, MD;  Location: [place] [facility];  Service: Cardiovascular;  Laterality: N/A;   Family History   Problem Relation Age of Onset  . Heart attack Father   . Stroke Father    Social History  Substance Use Topics  . Smoking status: Current Some Day Smoker -- 0.50 packs/day for 46 years    Types: Cigarettes    Start date: 06/07/1979  . Smokeless tobacco: Former Systems developer    Types: Chew  . Alcohol Use: 3.0 - 3.6 oz/week    5-6 Shots of liquor, 0 Standard drinks or equivalent per week    Review of Systems  Constitutional: Negative for fever and chills.  Respiratory: Negative for chest tightness.   Cardiovascular: Negative for chest pain.  Gastrointestinal: Negative for nausea, vomiting, abdominal pain and diarrhea.  Genitourinary: Negative for dysuria, flank pain, decreased urine volume, scrotal swelling, difficulty urinating and testicular pain.  Musculoskeletal: Positive for arthralgias (Right hip pain). Negative for joint swelling.  Skin: Negative for color change and wound.  Neurological: Negative for weakness and numbness.  All other systems reviewed and are negative.     Allergies  Lisinopril; Amoxicillin; and Carvedilol  Home Medications   Prior to Admission medications   Medication Sig Start Date End Date Taking? Authorizing Provider  albuterol (PROVENTIL HFA;VENTOLIN HFA) 108 (90 BASE) MCG/ACT inhaler Inhale 2 puffs into the lungs every 6 (six) hours as needed for wheezing or shortness of breath. 07/18/15   Soyla Dryer, PA-C  amLODipine (NORVASC) 5 MG tablet Take 1  tablet (5 mg total) by mouth daily. 06/27/15   Soyla Dryer, PA-C  aspirin EC 81 MG tablet Take 81 mg by mouth daily.    Historical Provider, MD  clopidogrel (PLAVIX) 75 MG tablet Take 1 tablet (75 mg total) by mouth daily. 07/11/15   Imogene Burn, PA-C  HYDROcodone-acetaminophen (NORCO) 7.5-325 MG tablet Take 1 tablet by mouth every 6 (six) hours as needed for moderate pain. 11/06/15   Evalee Jefferson, PA-C  ibuprofen (ADVIL,MOTRIN) 200 MG tablet Take 800 mg by mouth every 6 (six) hours as needed for moderate  pain.     Historical Provider, MD  isosorbide mononitrate (IMDUR) 30 MG 24 hr tablet Take 0.5 tablets (15 mg total) by mouth daily. 08/03/15   Arnoldo Lenis, MD  Menthol, Topical Analgesic, (BENGAY EX) Apply 1 application topically daily.    Historical Provider, MD  mometasone-formoterol (DULERA) 100-5 MCG/ACT AERO Inhale 2 puffs into the lungs 2 (two) times daily. 07/18/15   Soyla Dryer, PA-C  nitroGLYCERIN (NITROSTAT) 0.4 MG SL tablet Place 1 tablet (0.4 mg total) under the tongue every 5 (five) minutes as needed for chest pain. 02/09/15   Almyra Deforest, PA  sodium chloride (OCEAN) 0.65 % SOLN nasal spray Place 1 spray into both nostrils as needed for congestion.    Historical Provider, MD   BP 167/94 mmHg  Pulse 96  Temp(Src) 97.9 F (36.6 C) (Oral)  Resp 16  Ht '5\' 5"'$  (1.651 m)  Wt 99.791 kg  BMI 36.61 kg/m2  SpO2 92% Physical Exam  Constitutional: He is oriented to person, place, and time. He appears well-developed and well-nourished. No distress.  HENT:  Head: Normocephalic and atraumatic.  Cardiovascular: Normal rate, regular rhythm and intact distal pulses.   No murmur heard. Pulmonary/Chest: Effort normal and breath sounds normal. No respiratory distress.  Abdominal: Soft. He exhibits no distension. There is no tenderness. There is no rebound.  Musculoskeletal: He exhibits tenderness. He exhibits no edema.       Lumbar back: He exhibits tenderness and pain. He exhibits normal range of motion, no swelling, no deformity, no laceration and normal pulse.  Tenderness at the lateral right hip, pain with range of motion. DP pulses are brisk and symmetrical.  Distal sensation intact.  Pt has 5/5 strength against resistance of bilateral lower extremities.     Neurological: He is alert and oriented to person, place, and time. He has normal strength. No sensory deficit. He exhibits normal muscle tone. Coordination and gait normal.  Reflex Scores:      Patellar reflexes are 2+ on the right  side and 2+ on the left side.      Achilles reflexes are 2+ on the right side and 2+ on the left side. Skin: Skin is warm and dry. No rash noted.  Nursing note and vitals reviewed.   ED Course  Procedures (including critical care time) Labs Review Labs Reviewed - No data to display  Imaging Review No results found. I have personally reviewed and evaluated these images and lab results as part of my medical decision-making.   EKG Interpretation None      Patient had imaging of the right hip January 2017 that shows severe degenerative changes of the joint. X-ray was reviewed by me and considered in my MDM  MDM   Final diagnoses:  Primary osteoarthritis of right hip    Patient well-appearing. Vital signs are stable. No concerning symptoms for septic joint. This is a chronic problem. Patient is awaiting  orthopedic referral from his PCP. He agrees to symptomatic treatment, I will prescribe a short course of Norco and he agrees to continue his ibuprofen 3 times daily.    Kem Parkinson, PA-C 11/15/15 5053  Forde Dandy, MD 11/15/15 [phone]

## 2015-11-15 NOTE — ED Notes (Signed)
Pt states he has chronic right hip pain.  Denies new injury.  States he was seen for the same a week ago and ran out of pain medications.

## 2015-11-15 NOTE — Discharge Instructions (Signed)
Osteoarthritis Osteoarthritis is a disease that causes soreness and inflammation of a joint. It occurs when the cartilage at the affected joint wears down. Cartilage acts as a cushion, covering the ends of bones where they meet to form a joint. Osteoarthritis is the most common form of arthritis. It often occurs in older people. The joints affected most often by this condition include those in the:  Ends of the fingers.  Thumbs.  Neck.  Lower back.  Knees.  Hips. CAUSES  Over time, the cartilage that covers the ends of bones begins to wear away. This causes bone to rub on bone, producing pain and stiffness in the affected joints.  RISK FACTORS Certain factors can increase your chances of having osteoarthritis, including:  Older age.  Excessive body weight.  Overuse of joints.  Previous joint injury. SIGNS AND SYMPTOMS   Pain, swelling, and stiffness in the joint.  Over time, the joint may lose its normal shape.  Small deposits of bone (osteophytes) may grow on the edges of the joint.  Bits of bone or cartilage can break off and float inside the joint space. This may cause more pain and damage. DIAGNOSIS  Your health care provider will do a physical exam and ask about your symptoms. Various tests may be ordered, such as:  X-rays of the affected joint.  Blood tests to rule out other types of arthritis. Additional tests may be used to diagnose your condition. TREATMENT  Goals of treatment are to control pain and improve joint function. Treatment plans may include:  A prescribed exercise program that allows for rest and joint relief.  A weight control plan.  Pain relief techniques, such as:  Properly applied heat and cold.  Electric pulses delivered to nerve endings under the skin (transcutaneous electrical nerve stimulation [TENS]).  Massage.  Certain nutritional supplements.  Medicines to control pain, such as:  Acetaminophen.  Nonsteroidal  anti-inflammatory drugs (NSAIDs), such as naproxen.  Narcotic or central-acting agents, such as tramadol.  Corticosteroids. These can be given orally or as an injection.  Surgery to reposition the bones and relieve pain (osteotomy) or to remove loose pieces of bone and cartilage. Joint replacement may be needed in advanced states of osteoarthritis. HOME CARE INSTRUCTIONS   Take medicines only as directed by your health care provider.  Maintain a healthy weight. Follow your health care provider's instructions for weight control. This may include dietary instructions.  Exercise as directed. Your health care provider can recommend specific types of exercise. These may include:  Strengthening exercises. These are done to strengthen the muscles that support joints affected by arthritis. They can be performed with weights or with exercise bands to add resistance.  Aerobic activities. These are exercises, such as brisk walking or low-impact aerobics, that get your heart pumping.  Range-of-motion activities. These keep your joints limber.  Balance and agility exercises. These help you maintain daily living skills.  Rest your affected joints as directed by your health care provider.  Keep all follow-up visits as directed by your health care provider. SEEK MEDICAL CARE IF:   Your skin turns red.  You develop a rash in addition to your joint pain.  You have worsening joint pain.  You have a fever along with joint or muscle aches. SEEK IMMEDIATE MEDICAL CARE IF:  You have a significant loss of weight or appetite.  You have night sweats. [place] of Arthritis and Musculoskeletal and Skin Diseases: www.niams.SouthExposed.es  National Institute on  Aging: http://kim-miller.com/  American College of Rheumatology: www.rheumatology.org   This information is not intended to replace advice given to you by your health care provider. Make sure you discuss any questions you  have with your health care provider.   Document Released: 08/18/2005 Document Revised: 09/08/2014 Document Reviewed: 04/25/2013 Elsevier Interactive Patient Education 2016 [place] therapy can help ease sore, stiff, injured, and tight muscles and joints. Heat relaxes your muscles, which may help ease your pain. Heat therapy should only be used on old, pre-existing, or long-lasting (chronic) injuries. Do not use heat therapy unless told by your doctor. HOW TO USE HEAT THERAPY There are several different kinds of heat therapy, including:  Moist heat pack.  Warm water bath.  Hot water bottle.  Electric heating pad.  Heated gel pack.  Heated wrap.  Electric heating pad. GENERAL HEAT THERAPY RECOMMENDATIONS   Do not sleep while using heat therapy. Only use heat therapy while you are awake.  Your skin may turn pink while using heat therapy. Do not use heat therapy if your skin turns red.  Do not use heat therapy if you have new pain.  High heat or long exposure to heat can cause burns. Be careful when using heat therapy to avoid burning your skin.  Do not use heat therapy on areas of your skin that are already irritated, such as with a rash or sunburn. GET HELP IF:   You have blisters, redness, swelling (puffiness), or numbness.  You have new pain.  Your pain is worse. MAKE SURE YOU:  Understand these instructions.  Will watch your condition.  Will get help right away if you are not doing well or get worse.   This information is not intended to replace advice given to you by your health care provider. Make sure you discuss any questions you have with your health care provider.   Document Released: 11/10/2011 Document Revised: 09/08/2014 Document Reviewed: 10/11/2013 Elsevier Interactive Patient Education [employer].

## 2015-11-27 ENCOUNTER — Emergency Department (HOSPITAL_COMMUNITY)
Admission: EM | Admit: 2015-11-27 | Discharge: 2015-11-27 | Disposition: A | Discharge status: Home / Self Care | Payer: Self-pay | Attending: Emergency Medicine | Admitting: Emergency Medicine

## 2015-11-27 ENCOUNTER — Encounter (HOSPITAL_COMMUNITY): Payer: Self-pay | Admitting: Emergency Medicine

## 2015-11-27 DIAGNOSIS — I2510 Atherosclerotic heart disease of native coronary artery without angina pectoris: Secondary | ICD-10-CM | POA: Insufficient documentation

## 2015-11-27 DIAGNOSIS — Z79899 Other long term (current) drug therapy: Secondary | ICD-10-CM | POA: Insufficient documentation

## 2015-11-27 DIAGNOSIS — Z7982 Long term (current) use of aspirin: Secondary | ICD-10-CM | POA: Insufficient documentation

## 2015-11-27 DIAGNOSIS — Z79891 Long term (current) use of opiate analgesic: Secondary | ICD-10-CM | POA: Insufficient documentation

## 2015-11-27 DIAGNOSIS — I252 Old myocardial infarction: Secondary | ICD-10-CM | POA: Insufficient documentation

## 2015-11-27 DIAGNOSIS — F17210 Nicotine dependence, cigarettes, uncomplicated: Secondary | ICD-10-CM | POA: Insufficient documentation

## 2015-11-27 DIAGNOSIS — M25551 Pain in right hip: Secondary | ICD-10-CM | POA: Insufficient documentation

## 2015-11-27 DIAGNOSIS — M542 Cervicalgia: Secondary | ICD-10-CM | POA: Insufficient documentation

## 2015-11-27 DIAGNOSIS — Z791 Long term (current) use of non-steroidal anti-inflammatories (NSAID): Secondary | ICD-10-CM | POA: Insufficient documentation

## 2015-11-27 MED ORDER — METHOCARBAMOL 500 MG PO TABS: 500.0000 mg | ORAL_TABLET | Freq: Three times a day (TID) | ORAL | Status: DC

## 2015-11-27 MED ORDER — DEXAMETHASONE 4 MG PO TABS: 4.0000 mg | ORAL_TABLET | Freq: Two times a day (BID) | ORAL | Status: DC

## 2015-11-27 NOTE — ED Provider Notes (Signed)
CSN: [id number]     Arrival date & time 11/27/15  4332 History   First MD Initiated Contact with Patient 11/27/15 1150     Chief Complaint  Patient presents with  . Hip Pain     (Consider location/radiation/quality/duration/timing/severity/associated sxs/prior Treatment) HPI Comments: Patient is a 50 year old male who presents to the emergency department with back pain.  The patient states he has had back pain for quite some time. He was seen in the emergency department on March 16, at which time he was given a short course of Norco for pain. Hx of DJD of the hip. The patient presents to the emergency department today because he says he is out of his pain medication, and he request additional pain prescription, and preferably something stronger. The patient is not had any recent falls. He is waiting to get his disability insurance so that he can see a specialist. His been no high fever reported. The patient denies any unusual rash in the hip area. He's not had any recent procedures.  The history is provided by the patient.    Past Medical History  Diagnosis Date  . Neck pain   . Otitis media   . Pleurisy   . CAD (coronary artery disease)     lateral STEMI 02/06/2015 00% D1 occlusion treated with Promus Premier 2.5 mm x 16 mm DES, 70% ramus stenosis, 40% mid RCA stenosis, 45% distal RCA stenosis, EF 45-50%  . Sinus pause     9 sec sinus pause on telemetry after started on coreg after MI, avoid AV nodal blocking agent  . MI (myocardial infarction) [facility])    Past Surgical History  Procedure Laterality Date  . Incision / drainage hand / finger    . Cardiac catheterization N/A 02/06/2015    Procedure: Left Heart Cath and Coronary Angiography;  Surgeon: Leonie Man, MD;  Location:  CV LAB;  Service: Cardiovascular;  Laterality: N/A;  . Cardiac catheterization N/A 02/06/2015    Procedure: Coronary Stent Intervention;  Surgeon: Leonie Man, MD;  Location: [place] [facility];   Service: Cardiovascular;  Laterality: N/A;   Family History  Problem Relation Age of Onset  . Heart attack Father   . Stroke Father    Social History  Substance Use Topics  . Smoking status: Current Some Day Smoker -- 0.50 packs/day for 46 years    Types: Cigarettes    Start date: 06/07/1979  . Smokeless tobacco: Former Systems developer    Types: Chew  . Alcohol Use: 3.0 - 3.6 oz/week    5-6 Shots of liquor, 0 Standard drinks or equivalent per week    Review of Systems  Musculoskeletal: Positive for arthralgias and neck pain.  All other systems reviewed and are negative.     Allergies  Lisinopril; Amoxicillin; and Carvedilol  Home Medications   Prior to Admission medications   Medication Sig Start Date End Date Taking? Authorizing Provider  albuterol (PROVENTIL HFA;VENTOLIN HFA) 108 (90 BASE) MCG/ACT inhaler Inhale 2 puffs into the lungs every 6 (six) hours as needed for wheezing or shortness of breath. 07/18/15  Yes Soyla Dryer, PA-C  amLODipine (NORVASC) 5 MG tablet Take 1 tablet (5 mg total) by mouth daily. 06/27/15  Yes Soyla Dryer, PA-C  aspirin EC 81 MG tablet Take 81 mg by mouth daily.   Yes Historical Provider, MD  clopidogrel (PLAVIX) 75 MG tablet Take 1 tablet (75 mg total) by mouth daily. 07/11/15  Yes Imogene Burn, PA-C  HYDROcodone-acetaminophen (  NORCO) 7.5-325 MG tablet Take 1 tablet by mouth every 6 (six) hours as needed for moderate pain. 11/15/15  Yes Tammy Triplett, PA-C  ibuprofen (ADVIL,MOTRIN) 200 MG tablet Take 800 mg by mouth every 6 (six) hours as needed for moderate pain.    Yes Historical Provider, MD  isosorbide mononitrate (IMDUR) 30 MG 24 hr tablet Take 0.5 tablets (15 mg total) by mouth daily. 08/03/15  Yes Arnoldo Lenis, MD  Menthol, Topical Analgesic, (BENGAY EX) Apply 1 application topically daily.   Yes Historical Provider, MD  mometasone-formoterol (DULERA) 100-5 MCG/ACT AERO Inhale 2 puffs into the lungs 2 (two) times daily. 07/18/15  Yes  Soyla Dryer, PA-C  nitroGLYCERIN (NITROSTAT) 0.4 MG SL tablet Place 1 tablet (0.4 mg total) under the tongue every 5 (five) minutes as needed for chest pain. 02/09/15  Yes Almyra Deforest, PA  sodium chloride (OCEAN) 0.65 % SOLN nasal spray Place 1 spray into both nostrils as needed for congestion.   Yes Historical Provider, MD   BP 156/82 mmHg  Pulse 87  Temp(Src) 98.2 F (36.8 C) (Oral)  Resp 16  Ht '5\' 5"'$  (1.651 m)  SpO2 100% Physical Exam  Constitutional: He is oriented to person, place, and time. He appears well-developed and well-nourished.  Non-toxic appearance.  HENT:  Head: Normocephalic.  Right Ear: Tympanic membrane and external ear normal.  Left Ear: Tympanic membrane and external ear normal.  Eyes: EOM and lids are normal. Pupils are equal, round, and reactive to light.  Neck: Normal range of motion. Neck supple. Carotid bruit is not present.  Cardiovascular: Normal rate, regular rhythm, normal heart sounds, intact distal pulses and normal pulses.   Pulmonary/Chest: Breath sounds normal. No respiratory distress.  Course breath sounds present. Symmetrical rise and fall of the chest.  Abdominal: Soft. Bowel sounds are normal. There is no tenderness. There is no guarding.  Musculoskeletal: Normal range of motion.       Right hip: He exhibits crepitus.  Pain of the right hip with attempted ROM. No hot areas. Some crepitus. No deformity or dislocation.  Lymphadenopathy:       Head (right side): No submandibular adenopathy present.       Head (left side): No submandibular adenopathy present.    He has no cervical adenopathy.  Neurological: He is alert and oriented to person, place, and time. He has normal strength. No cranial nerve deficit or sensory deficit.  Skin: Skin is warm and dry.  Psychiatric: He has a normal mood and affect. His speech is normal.  Nursing note and vitals reviewed.   ED Course  Procedures (including critical care time) Labs Review Labs Reviewed - No  data to display  Imaging Review No results found. I have personally reviewed and evaluated these images and lab results as part of my medical decision-making.   EKG Interpretation None      MDM  I discussed with the patient that his examination shows no acute or emergent changes concerning his hip. Discuss with him that I will prescribe Decadron and Robaxin for assistance with his discomfort. Discussed with him that the emergency department is not set up for chronic pain, and that he should discuss his pain management with Dr. Roslynn Amble. The patient acknowledges understanding of this discharge instruction.    Final diagnoses:  None    **I have reviewed nursing notes, vital signs, and all appropriate lab and imaging results for this patient.Lily Kocher, PA-C 11/27/15 1307  Ripley Fraise, MD  11/27/15 1541 

## 2015-11-27 NOTE — Discharge Instructions (Signed)
A heating pad to your hip maybe helpful. Please use Decadron 2 times daily with food. Use Robaxin for spasm type pain. See Dr Roslynn Amble for assistance with your pain control. Hip Pain Your hip is the joint between your upper legs and your lower pelvis. The bones, cartilage, tendons, and muscles of your hip joint perform a lot of work each day supporting your body weight and allowing you to move around. Hip pain can range from a minor ache to severe pain in one or both of your hips. Pain may be felt on the inside of the hip joint near the groin, or the outside near the buttocks and upper thigh. You may have swelling or stiffness as well.  HOME CARE INSTRUCTIONS   Take medicines only as directed by your health care provider.  Apply ice to the injured area:  Put ice in a plastic bag.  Place a towel between your skin and the bag.  Leave the ice on for 15-20 minutes at a time, 3-4 times a day.  Keep your leg raised (elevated) when possible to lessen swelling.  Avoid activities that cause pain.  Follow specific exercises as directed by your health care provider.  Sleep with a pillow between your legs on your most comfortable side.  Record how often you have hip pain, the location of the pain, and what it feels like. SEEK MEDICAL CARE IF:   You are unable to put weight on your leg.  Your hip is red or swollen or very tender to touch.  Your pain or swelling continues or worsens after 1 week.  You have increasing difficulty walking.  You have a fever. SEEK IMMEDIATE MEDICAL CARE IF:   You have fallen.  You have a sudden increase in pain and swelling in your hip. MAKE SURE YOU:   Understand these instructions.  Will watch your condition.  Will get help right away if you are not doing well or get worse.   This information is not intended to replace advice given to you by your health care provider. Make sure you discuss any questions you have with your health care provider.     Document Released: 02/05/2010 Document Revised: 09/08/2014 Document Reviewed: 04/14/2013 Elsevier Interactive Patient Education [employer].

## 2015-11-27 NOTE — ED Notes (Signed)
C/o right hip pain, rates pain 10/10.  History of hip pain.

## 2015-12-05 ENCOUNTER — Other Ambulatory Visit: Payer: Self-pay | Admitting: Physician Assistant

## 2015-12-10 ENCOUNTER — Observation Stay (HOSPITAL_COMMUNITY)
Admission: EM | Admit: 2015-12-10 | Discharge: 2015-12-11 | Disposition: A | Discharge status: Home / Self Care | Payer: Self-pay | Attending: Internal Medicine | Admitting: Internal Medicine

## 2015-12-10 ENCOUNTER — Encounter (HOSPITAL_COMMUNITY): Payer: Self-pay | Admitting: *Deleted

## 2015-12-10 ENCOUNTER — Emergency Department (HOSPITAL_COMMUNITY): Payer: Self-pay

## 2015-12-10 DIAGNOSIS — F17210 Nicotine dependence, cigarettes, uncomplicated: Secondary | ICD-10-CM | POA: Insufficient documentation

## 2015-12-10 DIAGNOSIS — Z7982 Long term (current) use of aspirin: Secondary | ICD-10-CM | POA: Insufficient documentation

## 2015-12-10 DIAGNOSIS — R072 Precordial pain: Principal | ICD-10-CM | POA: Insufficient documentation

## 2015-12-10 DIAGNOSIS — I252 Old myocardial infarction: Secondary | ICD-10-CM | POA: Insufficient documentation

## 2015-12-10 DIAGNOSIS — E782 Mixed hyperlipidemia: Secondary | ICD-10-CM | POA: Diagnosis present

## 2015-12-10 DIAGNOSIS — I2510 Atherosclerotic heart disease of native coronary artery without angina pectoris: Secondary | ICD-10-CM | POA: Diagnosis present

## 2015-12-10 DIAGNOSIS — Z8679 Personal history of other diseases of the circulatory system: Secondary | ICD-10-CM

## 2015-12-10 DIAGNOSIS — I10 Essential (primary) hypertension: Secondary | ICD-10-CM | POA: Diagnosis present

## 2015-12-10 DIAGNOSIS — R0602 Shortness of breath: Secondary | ICD-10-CM | POA: Insufficient documentation

## 2015-12-10 DIAGNOSIS — Z79899 Other long term (current) drug therapy: Secondary | ICD-10-CM | POA: Insufficient documentation

## 2015-12-10 DIAGNOSIS — Z791 Long term (current) use of non-steroidal anti-inflammatories (NSAID): Secondary | ICD-10-CM | POA: Insufficient documentation

## 2015-12-10 DIAGNOSIS — R079 Chest pain, unspecified: Secondary | ICD-10-CM

## 2015-12-10 LAB — BASIC METABOLIC PANEL
Anion gap: 11 (ref 5–15)
BUN: 13 mg/dL (ref 6–20)
CHLORIDE: 103 mmol/L (ref 101–111)
CO2: 23 mmol/L (ref 22–32)
CREATININE: 0.9 mg/dL (ref 0.61–1.24)
Calcium: 9 mg/dL (ref 8.9–10.3)
GFR calc Af Amer: >60 mL/min (ref >60)
GFR calc non Af Amer: >60 mL/min (ref >60)
GLUCOSE: 153 mg/dL — AB (ref 65–99)
POTASSIUM: 3.6 mmol/L (ref 3.5–5.1)
Sodium: 137 mmol/L (ref 135–145)

## 2015-12-10 LAB — CBC
HEMATOCRIT: 43.5 % (ref 39.0–52.0)
Hemoglobin: 14.6 g/dL (ref 13.0–17.0)
MCH: 29 pg (ref 26.0–34.0)
MCHC: 33.6 g/dL (ref 30.0–36.0)
MCV: 86.5 fL (ref 78.0–100.0)
PLATELETS: 234 10*3/uL (ref 150–400)
RBC: 5.03 MIL/uL (ref 4.22–5.81)
RDW: 14 % (ref 11.5–15.5)
WBC: 15.9 10*3/uL — ABNORMAL HIGH (ref 4.0–10.5)

## 2015-12-10 LAB — I-STAT TROPONIN, ED: Troponin i, poc: 0.01 ng/mL (ref 0.00–0.08)

## 2015-12-10 LAB — TROPONIN I: Troponin I: <0.03 ng/mL (ref <0.031)

## 2015-12-10 MED ORDER — MORPHINE SULFATE (PF) 2 MG/ML IV SOLN
2.0000 mg | INTRAVENOUS | Status: DC | PRN
  Administered 2015-12-10 – 2015-12-11 (×2): 2 mg via INTRAVENOUS
  Filled 2015-12-10 (×2): qty 1

## 2015-12-10 MED ORDER — MOMETASONE FURO-FORMOTEROL FUM 100-5 MCG/ACT IN AERO
2.0000 | INHALATION_SPRAY | Freq: Two times a day (BID) | RESPIRATORY_TRACT | Status: DC
  Administered 2015-12-11: 2 via RESPIRATORY_TRACT
  Filled 2015-12-10: qty 8.8

## 2015-12-10 MED ORDER — ASPIRIN EC 81 MG PO TBEC
81.0000 mg | DELAYED_RELEASE_TABLET | Freq: Every day | ORAL | Status: DC
  Administered 2015-12-11: 81 mg via ORAL
  Filled 2015-12-10: qty 1

## 2015-12-10 MED ORDER — ASPIRIN 81 MG PO CHEW
243.0000 mg | CHEWABLE_TABLET | Freq: Once | ORAL | Status: AC
  Administered 2015-12-10: 243 mg via ORAL
  Filled 2015-12-10: qty 3

## 2015-12-10 MED ORDER — NITROGLYCERIN 0.4 MG SL SUBL
0.4000 mg | SUBLINGUAL_TABLET | SUBLINGUAL | Status: AC | PRN
  Administered 2015-12-10 (×3): 0.4 mg via SUBLINGUAL
  Filled 2015-12-10: qty 1

## 2015-12-10 MED ORDER — ATORVASTATIN CALCIUM 20 MG PO TABS: 20.0000 mg | ORAL_TABLET | Freq: Every day | ORAL | Status: DC

## 2015-12-10 MED ORDER — CLOPIDOGREL BISULFATE 75 MG PO TABS
75.0000 mg | ORAL_TABLET | Freq: Every day | ORAL | Status: DC
  Administered 2015-12-11: 75 mg via ORAL
  Filled 2015-12-10: qty 1

## 2015-12-10 MED ORDER — ONDANSETRON HCL 4 MG/2ML IJ SOLN: 4.0000 mg | Freq: Four times a day (QID) | INTRAMUSCULAR | Status: DC | PRN

## 2015-12-10 MED ORDER — AMLODIPINE BESYLATE 5 MG PO TABS: 5.0000 mg | ORAL_TABLET | Freq: Every day | ORAL | Status: DC

## 2015-12-10 MED ORDER — AMLODIPINE BESYLATE 5 MG PO TABS
5.0000 mg | ORAL_TABLET | Freq: Every day | ORAL | Status: DC
  Administered 2015-12-11: 5 mg via ORAL
  Filled 2015-12-10: qty 1

## 2015-12-10 MED ORDER — ASPIRIN EC 81 MG PO TBEC: 81.0000 mg | DELAYED_RELEASE_TABLET | Freq: Every day | ORAL | Status: DC

## 2015-12-10 MED ORDER — CLOPIDOGREL BISULFATE 75 MG PO TABS: 75.0000 mg | ORAL_TABLET | Freq: Every day | ORAL | Status: DC

## 2015-12-10 MED ORDER — ENOXAPARIN SODIUM 40 MG/0.4ML ~~LOC~~ SOLN
40.0000 mg | SUBCUTANEOUS | Status: DC
  Administered 2015-12-10: 40 mg via SUBCUTANEOUS
  Filled 2015-12-10: qty 0.4

## 2015-12-10 MED ORDER — MORPHINE SULFATE (PF) 4 MG/ML IV SOLN
4.0000 mg | Freq: Once | INTRAVENOUS | Status: AC
  Administered 2015-12-10: 4 mg via INTRAVENOUS
  Filled 2015-12-10: qty 1

## 2015-12-10 MED ORDER — NITROGLYCERIN 2 % TD OINT
0.5000 [in_us] | TOPICAL_OINTMENT | Freq: Four times a day (QID) | TRANSDERMAL | Status: DC
  Administered 2015-12-10 – 2015-12-11 (×2): 0.5 [in_us] via TOPICAL
  Filled 2015-12-10 (×2): qty 1

## 2015-12-10 MED ORDER — ACETAMINOPHEN 325 MG PO TABS
650.0000 mg | ORAL_TABLET | ORAL | Status: DC | PRN
Start: 2015-12-10 — End: 2015-12-11

## 2015-12-10 NOTE — H&P (Signed)
PCP:   Soyla Dryer, PA-C   Chief Complaint:  Chest pain  HPI: 50 year old male who   has a past medical history of Neck pain; Otitis media; Pleurisy; CAD (coronary artery disease); Sinus pause; and MI (myocardial infarction) ([place]). Ray presents to the hospital with complaints of chest pain which started around 5 PM this evening. As per patient he was just sitting in his porch and drinking beer when he experienced chest pain. The pain was similar to what he had experienced when he had MI so he got concerned and came to the ED for further evaluation. In the ED chest pain was relieved after patient received 3 doses of nitroglycerin and came back after the nitroglycerin wore off. Cardec enzymes are negative. EKG showed sinus tachycardia. Patient also complained of clamminess with chest pain, and shortness of breath. He had some nausea but no vomiting. Denies diarrhea. Denies any fever or dysuria.  Allergies:   Allergies  Allergen Reactions  . Lisinopril Shortness Of Breath and Swelling    Angioedema, required intubation and mechanical ventilation  . Amoxicillin Nausea And Vomiting  . Carvedilol Other (See Comments)    Sinus pause on telemetry >3 seconds. Longest one 9 sec. No AV nodal agent      Past Medical History  Diagnosis Date  . Neck pain   . Otitis media   . Pleurisy   . CAD (coronary artery disease)     lateral STEMI 02/06/2015 00% D1 occlusion treated with Promus Premier 2.5 mm x 16 mm DES, 70% ramus stenosis, 40% mid RCA stenosis, 45% distal RCA stenosis, EF 45-50%  . Sinus pause     9 sec sinus pause on telemetry after started on coreg after MI, avoid AV nodal blocking agent  . MI (myocardial infarction) [facility])     Past Surgical History  Procedure Laterality Date  . Incision / drainage hand / finger    . Cardiac catheterization N/A 02/06/2015    Procedure: Left Heart Cath and Coronary Angiography;  Surgeon: Leonie Man, MD;  Location: [place] [facility];   Service: Cardiovascular;  Laterality: N/A;  . Cardiac catheterization N/A 02/06/2015    Procedure: Coronary Stent Intervention;  Surgeon: Leonie Man, MD;  Location: [place] [facility];  Service: Cardiovascular;  Laterality: N/A;    Prior to Admission medications   Medication Sig Start Date End Date Taking? Authorizing Provider  albuterol (PROVENTIL HFA;VENTOLIN HFA) 108 (90 BASE) MCG/ACT inhaler Inhale 2 puffs into the lungs every 6 (six) hours as needed for wheezing or shortness of breath. 07/18/15  Yes Soyla Dryer, PA-C  amLODipine (NORVASC) 5 MG tablet TAKE ONE TABLET BY MOUTH ONCE DAILY 12/05/15  Yes Soyla Dryer, PA-C  aspirin EC 81 MG tablet Take 81 mg by mouth daily.   Yes Historical Provider, MD  atorvastatin (LIPITOR) 20 MG tablet Take 20 mg by mouth daily. 09/27/15  Yes Historical Provider, MD  clopidogrel (PLAVIX) 75 MG tablet Take 1 tablet (75 mg total) by mouth daily. 07/11/15  Yes Imogene Burn, PA-C  ibuprofen (ADVIL,MOTRIN) 200 MG tablet Take 600-800 mg by mouth every 6 (six) hours as needed for moderate pain.    Yes Historical Provider, MD  isosorbide mononitrate (IMDUR) 30 MG 24 hr tablet Take 0.5 tablets (15 mg total) by mouth daily. 08/03/15  Yes Arnoldo Lenis, MD  Menthol, Topical Analgesic, (BENGAY EX) Apply 1 application topically daily.   Yes Historical Provider, MD  mometasone-formoterol (DULERA) 100-5 MCG/ACT AERO Inhale  2 puffs into the lungs 2 (two) times daily. 07/18/15  Yes Soyla Dryer, PA-C  Multiple Vitamin (MULTIVITAMIN WITH MINERALS) TABS tablet Take 1 tablet by mouth daily.   Yes Historical Provider, MD  nitroGLYCERIN (NITROSTAT) 0.4 MG SL tablet Place 1 tablet (0.4 mg total) under the tongue every 5 (five) minutes as needed for chest pain. 02/09/15  Yes Almyra Deforest, PA  sodium chloride (OCEAN) 0.65 % SOLN nasal spray Place 1 spray into both nostrils as needed for congestion.   Yes Historical Provider, MD  dexamethasone (DECADRON) 4 MG tablet Take 1  tablet (4 mg total) by mouth 2 (two) times daily with a meal. Patient not taking: Reported on 12/10/2015 11/27/15   Lily Kocher, PA-C  HYDROcodone-acetaminophen (NORCO) 7.5-325 MG tablet Take 1 tablet by mouth every 6 (six) hours as needed for moderate pain. Patient not taking: Reported on 12/10/2015 11/15/15   Tammy Triplett, PA-C  methocarbamol (ROBAXIN) 500 MG tablet Take 1 tablet (500 mg total) by mouth 3 (three) times daily. Patient not taking: Reported on 12/10/2015 11/27/15   Lily Kocher, PA-C    Social History:  reports that he has been smoking Cigarettes.  He started smoking about 36 years ago. He has a 23 pack-year smoking history. He has quit using smokeless tobacco. His smokeless tobacco use included Chew. He reports that he drinks about 3.0 - 3.6 oz of alcohol per week. He reports that he does not use illicit drugs.  Family History  Problem Relation Age of Onset  . Heart attack Father   . Stroke Father     Danley Danker Weights   12/10/15 1830  Weight: 99.791 kg (220 lb)    All the positives are listed in BOLD  Review of Systems:  HEENT: Headache, blurred vision, runny nose, sore throat Neck: Hypothyroidism, hyperthyroidism,,lymphadenopathy Chest : Shortness of breath, history of COPD, Asthma Heart : Chest pain, history of coronary arterey disease GI:  Nausea, vomiting, diarrhea, constipation, GERD GU: Dysuria, urgency, frequency of urination, hematuria Neuro: Stroke, seizures, syncope Psych: Depression, anxiety, hallucinations   Physical Exam: Blood pressure 124/89, pulse 91, temperature 98.1 F (36.7 C), temperature source Oral, resp. rate 20, height '5\' 5"'$  (1.651 m), weight 99.791 kg (220 lb), SpO2 98 %. Constitutional:   Patient is a well-developed and well-nourished male in no acute distress and cooperative with exam. Head: Normocephalic and atraumatic Mouth: Mucus membranes moist Eyes: PERRL, EOMI, conjunctivae normal Neck: Supple, No Thyromegaly Cardiovascular:  RRR, S1 normal, S2 normal Pulmonary/Chest: CTAB, no wheezes, rales, or rhonchi Abdominal: Soft. Non-tender, non-distended, bowel sounds are normal, no masses, organomegaly, or guarding present.  Neurological: A&O x3, Strength is normal and symmetric bilaterally, cranial nerve II-XII are grossly intact, no focal motor deficit, sensory intact to light touch bilaterally.  Extremities : No Cyanosis, Clubbing or Edema  Labs on Admission:  Basic Metabolic Panel:  Recent Labs Lab 12/10/15 1836  NA 137  K 3.6  CL 103  CO2 23  GLUCOSE 153*  BUN 13  CREATININE 0.90  CALCIUM 9.0   CBC:  Recent Labs Lab 12/10/15 1836  WBC 15.9*  HGB 14.6  HCT 43.5  MCV 86.5  PLT 234    Radiological Exams on Admission: Dg Chest 2 View  12/10/2015  CLINICAL DATA:  Chest pain with shortness of breath and weakness, coronary artery disease post MI June 2016 EXAM: CHEST  2 VIEW COMPARISON:  07/09/2015 FINDINGS: Upper normal heart size. RIGHT-side aortic arch again identified. Mediastinal contours and pulmonary vascularity otherwise normal.  Lungs grossly clear. No pulmonary infiltrate, pleural effusion or pneumothorax. Bones demineralized. IMPRESSION: RIGHT-side aortic arch. No definite acute abnormalities. Electronically Signed   By: Lavonia Dana M.D.   On: 12/10/2015 19:15    EKG: Independently reviewed. Sinus tachycardia   Assessment/Plan Active Problems:   Essential hypertension   CAD (coronary artery disease)   Hyperlipidemia   Chest pain   Chest pain Admit the patient under telemetry. Cycle Cardiac enzymes. Will start nitroglycerin patch half inch every 6 hours Morphine when necessary for pain  History of CAD Continue aspirin, Plavix, atorvastatin  Hyperlipidemia Continue Lipitor  DVT prophylaxis Lovenox  Code status: Full code  Family discussion: Admission, patients condition and plan of care including tests being ordered have been discussed with the patient and his girlfriend and  bedside* who indicate understanding and agree with the plan and Code Status.   Time Spent on Admission: 60 min  [facility] Pager: [phone] 12/10/2015, 9:49 PM  If 7PM-7AM, please contact night-coverage  [email or web address]  Password [email or web address]

## 2015-12-10 NOTE — ED Notes (Signed)
Pt comes in with tightness in his chest with shortness of breath. This started 4:30 today. Pt has only taken BP medication today.

## 2015-12-10 NOTE — ED Provider Notes (Signed)
CSN: [id number]     Arrival date & time 12/10/15  1828 History   First MD Initiated Contact with Patient 12/10/15 1828     Chief Complaint  Patient presents with  . Chest Pain     (Consider location/radiation/quality/duration/timing/severity/associated sxs/prior Treatment) HPI Comments: Patient is a 50 year old male with history of coronary artery disease status post MI with stent in June 2016. He presents today for evaluation of chest discomfort. This started approximately 90 minutes prior to presentation while he was at rest. He reports a tightness in his chest and shortness of breath with no nausea, diaphoresis, or radiation. These symptoms are similar to what he experienced with his prior MI.  Patient is a 50 y.o. male presenting with chest pain. The history is provided by the patient.  Chest Pain Pain location:  Substernal area Pain quality: tightness   Pain radiates to:  Does not radiate Pain radiates to the back: no   Pain severity:  Moderate Onset quality:  Sudden Duration:  1 hour Timing:  Constant Progression:  Unchanged Chronicity:  New Relieved by:  Nothing Worsened by:  Nothing tried Ineffective treatments:  None tried Associated symptoms: shortness of breath   Associated symptoms: no abdominal pain, no fever and no nausea   Risk factors: coronary artery disease     Past Medical History  Diagnosis Date  . Neck pain   . Otitis media   . Pleurisy   . CAD (coronary artery disease)     lateral STEMI 02/06/2015 00% D1 occlusion treated with Promus Premier 2.5 mm x 16 mm DES, 70% ramus stenosis, 40% mid RCA stenosis, 45% distal RCA stenosis, EF 45-50%  . Sinus pause     9 sec sinus pause on telemetry after started on coreg after MI, avoid AV nodal blocking agent  . MI (myocardial infarction) [facility])    Past Surgical History  Procedure Laterality Date  . Incision / drainage hand / finger    . Cardiac catheterization N/A 02/06/2015    Procedure: Left Heart Cath and  Coronary Angiography;  Surgeon: Leonie Man, MD;  Location: [place] [facility];  Service: Cardiovascular;  Laterality: N/A;  . Cardiac catheterization N/A 02/06/2015    Procedure: Coronary Stent Intervention;  Surgeon: Leonie Man, MD;  Location: [place] [facility];  Service: Cardiovascular;  Laterality: N/A;   Family History  Problem Relation Age of Onset  . Heart attack Father   . Stroke Father    Social History  Substance Use Topics  . Smoking status: Current Some Day Smoker -- 0.50 packs/day for 46 years    Types: Cigarettes    Start date: 06/07/1979  . Smokeless tobacco: Former Systems developer    Types: Chew  . Alcohol Use: 3.0 - 3.6 oz/week    5-6 Shots of liquor, 0 Standard drinks or equivalent per week    Review of Systems  Constitutional: Negative for fever.  Respiratory: Positive for shortness of breath.   Cardiovascular: Positive for chest pain.  Gastrointestinal: Negative for nausea and abdominal pain.  All other systems reviewed and are negative.     Allergies  Lisinopril; Amoxicillin; and Carvedilol  Home Medications   Prior to Admission medications   Medication Sig Start Date End Date Taking? Authorizing Provider  albuterol (PROVENTIL HFA;VENTOLIN HFA) 108 (90 BASE) MCG/ACT inhaler Inhale 2 puffs into the lungs every 6 (six) hours as needed for wheezing or shortness of breath. 07/18/15  Yes Soyla Dryer, PA-C  amLODipine (NORVASC) 5 MG tablet  TAKE ONE TABLET BY MOUTH ONCE DAILY 12/05/15  Yes Soyla Dryer, PA-C  aspirin EC 81 MG tablet Take 81 mg by mouth daily.   Yes Historical Provider, MD  atorvastatin (LIPITOR) 20 MG tablet Take 20 mg by mouth daily. 09/27/15  Yes Historical Provider, MD  clopidogrel (PLAVIX) 75 MG tablet Take 1 tablet (75 mg total) by mouth daily. 07/11/15  Yes Imogene Burn, PA-C  ibuprofen (ADVIL,MOTRIN) 200 MG tablet Take 600-800 mg by mouth every 6 (six) hours as needed for moderate pain.    Yes Historical Provider, MD  isosorbide  mononitrate (IMDUR) 30 MG 24 hr tablet Take 0.5 tablets (15 mg total) by mouth daily. 08/03/15  Yes Arnoldo Lenis, MD  Menthol, Topical Analgesic, (BENGAY EX) Apply 1 application topically daily.   Yes Historical Provider, MD  mometasone-formoterol (DULERA) 100-5 MCG/ACT AERO Inhale 2 puffs into the lungs 2 (two) times daily. 07/18/15  Yes Soyla Dryer, PA-C  Multiple Vitamin (MULTIVITAMIN WITH MINERALS) TABS tablet Take 1 tablet by mouth daily.   Yes Historical Provider, MD  nitroGLYCERIN (NITROSTAT) 0.4 MG SL tablet Place 1 tablet (0.4 mg total) under the tongue every 5 (five) minutes as needed for chest pain. 02/09/15  Yes Almyra Deforest, PA  sodium chloride (OCEAN) 0.65 % SOLN nasal spray Place 1 spray into both nostrils as needed for congestion.   Yes Historical Provider, MD  dexamethasone (DECADRON) 4 MG tablet Take 1 tablet (4 mg total) by mouth 2 (two) times daily with a meal. Patient not taking: Reported on 12/10/2015 11/27/15   Lily Kocher, PA-C  HYDROcodone-acetaminophen (NORCO) 7.5-325 MG tablet Take 1 tablet by mouth every 6 (six) hours as needed for moderate pain. Patient not taking: Reported on 12/10/2015 11/15/15   Tammy Triplett, PA-C  methocarbamol (ROBAXIN) 500 MG tablet Take 1 tablet (500 mg total) by mouth 3 (three) times daily. Patient not taking: Reported on 12/10/2015 11/27/15   Lily Kocher, PA-C   BP 104/55 mmHg  Pulse 97  Temp(Src) 98.1 F (36.7 C) (Oral)  Resp 18  Ht '5\' 5"'$  (1.651 m)  Wt 220 lb (99.791 kg)  BMI 36.61 kg/m2  SpO2 96% Physical Exam  Constitutional: He is oriented to person, place, and time. He appears well-developed and well-nourished. No distress.  HENT:  Head: Normocephalic and atraumatic.  Neck: Normal range of motion. Neck supple.  Cardiovascular: Normal rate, regular rhythm and normal heart sounds.   No murmur heard. Pulmonary/Chest: Effort normal and breath sounds normal. No respiratory distress. He has no wheezes. He has no rales.  Abdominal:  Soft. Bowel sounds are normal. He exhibits no distension. There is no tenderness.  Musculoskeletal: Normal range of motion. He exhibits no edema.  Lymphadenopathy:    He has no cervical adenopathy.  Neurological: He is alert and oriented to person, place, and time.  Skin: Skin is warm and dry. He is not diaphoretic.  Nursing note and vitals reviewed.   ED Course  Procedures (including critical care time) Labs Review Labs Reviewed  BASIC METABOLIC PANEL - Abnormal; Notable for the following:    Glucose, Bld 153 (*)    All other components within normal limits  CBC - Abnormal; Notable for the following:    WBC 15.9 (*)    All other components within normal limits  I-STAT TROPOININ, ED    Imaging Review Dg Chest 2 View  12/10/2015  CLINICAL DATA:  Chest pain with shortness of breath and weakness, coronary artery disease post MI June 2016  EXAM: CHEST  2 VIEW COMPARISON:  07/09/2015 FINDINGS: Upper normal heart size. RIGHT-side aortic arch again identified. Mediastinal contours and pulmonary vascularity otherwise normal. Lungs grossly clear. No pulmonary infiltrate, pleural effusion or pneumothorax. Bones demineralized. IMPRESSION: RIGHT-side aortic arch. No definite acute abnormalities. Electronically Signed   By: Lavonia Dana M.D.   On: 12/10/2015 19:15   I have personally reviewed and evaluated these images and lab results as part of my medical decision-making.   EKG Interpretation   Date/Time:  Monday December 10 2015 18:29:02 EDT Ventricular Rate:  104 PR Interval:  160 QRS Duration: 94 QT Interval:  327 QTC Calculation: 430 R Axis:   115 Text Interpretation:  Sinus tachycardia Right axis deviation Abnormal  lateral Q waves Probable anteroseptal infarct, old Baseline wander in  lead(s) II III aVF V1 Confirmed by Trevonte Ashkar  MD, Osmani Kersten ([id number]) on 12/10/2015  6:31:34 PM      MDM   Final diagnoses:  None    Patient presents with complaints of chest pain that he states is similar  to what he experienced with his prior MI in June of last year. His EKG is unchanged and first troponin is negative. He did actually achieve some relief with nitroglycerin here in the ER, however his discomfort soon returned. He was then given morphine. Due to his history and similarity of today's presentation to his prior MI, I feel as though admission for overnight observation would be appropriate. I've spoken with Dr. Darrick Meigs from the hospitalist service who agrees to admit.    Veryl Speak, MD 12/10/15 2128

## 2015-12-11 ENCOUNTER — Ambulatory Visit: Payer: Self-pay | Admitting: Physician Assistant

## 2015-12-11 DIAGNOSIS — R079 Chest pain, unspecified: Secondary | ICD-10-CM

## 2015-12-11 DIAGNOSIS — I10 Essential (primary) hypertension: Secondary | ICD-10-CM

## 2015-12-11 DIAGNOSIS — E785 Hyperlipidemia, unspecified: Secondary | ICD-10-CM

## 2015-12-11 LAB — CBC
HCT: 41.4 % (ref 39.0–52.0)
Hemoglobin: 13.6 g/dL (ref 13.0–17.0)
MCH: 28.5 pg (ref 26.0–34.0)
MCHC: 32.9 g/dL (ref 30.0–36.0)
MCV: 86.6 fL (ref 78.0–100.0)
PLATELETS: 219 10*3/uL (ref 150–400)
RBC: 4.78 MIL/uL (ref 4.22–5.81)
RDW: 14.1 % (ref 11.5–15.5)
WBC: 12.3 10*3/uL — AB (ref 4.0–10.5)

## 2015-12-11 LAB — COMPREHENSIVE METABOLIC PANEL
ALBUMIN: 3.3 g/dL — AB (ref 3.5–5.0)
ALK PHOS: 88 U/L (ref 38–126)
ALT: 37 U/L (ref 17–63)
AST: 22 U/L (ref 15–41)
Anion gap: 11 (ref 5–15)
BUN: 12 mg/dL (ref 6–20)
CHLORIDE: 102 mmol/L (ref 101–111)
CO2: 24 mmol/L (ref 22–32)
CREATININE: 0.81 mg/dL (ref 0.61–1.24)
Calcium: 8.3 mg/dL — ABNORMAL LOW (ref 8.9–10.3)
GFR calc Af Amer: >60 mL/min (ref >60)
GLUCOSE: 205 mg/dL — AB (ref 65–99)
Potassium: 3.6 mmol/L (ref 3.5–5.1)
SODIUM: 137 mmol/L (ref 135–145)
Total Bilirubin: 1 mg/dL (ref 0.3–1.2)
Total Protein: 6.4 g/dL — ABNORMAL LOW (ref 6.5–8.1)

## 2015-12-11 LAB — TROPONIN I: Troponin I: <0.03 ng/mL (ref <0.031)

## 2015-12-11 LAB — MAGNESIUM: Magnesium: 1.7 mg/dL (ref 1.7–2.4)

## 2015-12-11 MED ORDER — POTASSIUM CHLORIDE CRYS ER 20 MEQ PO TBCR
20.0000 meq | EXTENDED_RELEASE_TABLET | Freq: Every day | ORAL | Status: DC
  Administered 2015-12-11: 20 meq via ORAL
  Filled 2015-12-11: qty 1

## 2015-12-11 NOTE — Consult Note (Signed)
CARDIOLOGY CONSULT NOTE   Patient ID: Randy Hebert MRN: [id number] DOB/AGE: September 03, 1965 50 y.o.  Admit Date: 12/10/2015 Referring Physician: Erasmo Score MD Primary Physician: Soyla Dryer, PA-C Consulting Cardiologist: Carlyle Dolly MD Primary Cardiologist: Carlyle Dolly MD Reason for Consultation: Chest Pain  Clinical Summary Randy Hebert is a 50 y.o.male with known history of CAD, DES to D1, with 70% ramus and 40% mid RCA (cath 01/2016)that is being treated medically, systolic dysfunction with EF of 40% with lateral wall hypokinesis, chronic DOE with hx of COPD, and hyperlipidemia.  The patient states he was sitting on his front porch and he just finished a 40 ounce beer. He picked up a box of paper work as he is applying for disability, and began to walk and was house. While carrying a box. He had sudden onset of chest pressure beginning midsternal and radiating across his chest, bilaterally. The patient sat down the box and decided to eat a meal. Afterwards he lay down in his bed because the pain was continuous. He was hoping that it would go away. The patient became more concerned when it did not. And presented to the emergency room.  He states the pain is exactly how he felt when he was having a heart attack a year ago. However, the pain has been constant and unrelenting associated with inspiration and expiration. The patient states in the emergency room, they gave him nitroglycerin which helped for about 15 minutes but the pain has returned and has not gone away since admission.  On arrival to ER, BP 122/77, HR 107, afebrile, O2 sat 95%. Potassium 3.6, Creatinine 0.90.WBC 15.9 .Troponin negative X 2. EKG NSR with lateral Q-waves. No acute ST-T wave abnormalities. He was treated with ASA, NTG sublingual, IV morphine.   On further discussion, the patient states the day before this occurred. He had been digging holes with a post hole digger, planting trees and bushes  throughout the day. He was not sore. At the end of that day but began to have pain on Monday.  Allergies  Allergen Reactions  . Lisinopril Shortness Of Breath and Swelling    Angioedema, required intubation and mechanical ventilation  . Amoxicillin Nausea And Vomiting  . Carvedilol Other (See Comments)    Sinus pause on telemetry >3 seconds. Longest one 9 sec. No AV nodal agent    Medications Scheduled Medications: . amLODipine  5 mg Oral Daily  . aspirin EC  81 mg Oral Daily  . atorvastatin  20 mg Oral q1800  . clopidogrel  75 mg Oral Daily  . enoxaparin (LOVENOX) injection  40 mg Subcutaneous Q24H  . mometasone-formoterol  2 puff Inhalation BID  . nitroGLYCERIN  0.5 inch Topical 4 times per day    Infusions:    PRN Medications: acetaminophen, morphine injection, ondansetron (ZOFRAN) IV   Past Medical History  Diagnosis Date  . Neck pain   . Otitis media   . Pleurisy   . CAD (coronary artery disease)     lateral STEMI 02/06/2015 00% D1 occlusion treated with Promus Premier 2.5 mm x 16 mm DES, 70% ramus stenosis, 40% mid RCA stenosis, 45% distal RCA stenosis, EF 45-50%  . Sinus pause     9 sec sinus pause on telemetry after started on coreg after MI, avoid AV nodal blocking agent  . MI (myocardial infarction) [facility])     Past Surgical History  Procedure Laterality Date  . Incision / drainage hand / finger    . Cardiac  catheterization N/A 02/06/2015    Procedure: Left Heart Cath and Coronary Angiography;  Surgeon: Leonie Man, MD;  Location: [place] [facility];  Service: Cardiovascular;  Laterality: N/A;  . Cardiac catheterization N/A 02/06/2015    Procedure: Coronary Stent Intervention;  Surgeon: Leonie Man, MD;  Location: [place] [facility];  Service: Cardiovascular;  Laterality: N/A;    Family History  Problem Relation Age of Onset  . Heart attack Father   . Stroke Father     Social History Mr. Marchetta reports that he has been smoking Cigarettes.  He started  smoking about 36 years ago. He has a 23 pack-year smoking history. He has quit using smokeless tobacco. His smokeless tobacco use included Chew. Mr. Vannote reports that he drinks about 3.0 - 3.6 oz of alcohol per week.  Review of Systems Complete review of systems are found to be negative unless outlined in H&P above.  Physical Examination Blood pressure 134/73, pulse 100, temperature 99 F (37.2 C), temperature source Oral, resp. rate 20, height '5\' 5"'$  (1.651 m), weight 225 lb 3.2 oz (102.15 kg), SpO2 96 %. No intake or output data in the 24 hours ending 12/11/15 0826  Telemetry:  GEN: No acute distress. HEENT: Conjunctiva and lids normal, oropharynx clear with moist mucosa. Neck: Supple, no elevated JVP or carotid bruits, no thyromegaly. Lungs: Inspiratory wheezes with crackles in the right base. Cardiac: Regular rate and rhythm, tachycardic, no S3 or significant systolic murmur, no pericardial rub. Abdomen: Soft, nontender, no hepatomegaly, bowel sounds present, no guarding or rebound. Obese. Extremities: No pitting edema, distal pulses 2+. Skin: Warm and dry. Musculoskeletal: No kyphosis. Neuropsychiatric: Alert and oriented x3, affect grossly appropriate.  Prior Cardiac Testing/Procedures 1. Cardiac Cath-02/06/2015 1. 1st Diag lesion, 100% stenosed. A Promus Premier 2.5 mm x 16 mm drug-eluting stent was placed. There is a 0% residual stenosis post intervention. 2. Ramus lesion, 70% stenosed. 3. Mid RCA lesion, 40% stenosed. Dist RCA lesion, 45% stenosed. 4. Mild to moderately reduced LVEF with anterolateral and apical hypokinesis and elevated LVEDP  Abnormal anatomy with equal sized Diagonal and LAD Zerek Litsey but extensive RCA. Successful PCI of the First Diagonal Jariya Reichow.  Recommendations:  Standard post radial cath/PCI TR band removal  Dual independent therapy for minimum one year.  Check 2-D echocardiogram to better assess EF  Add statin and beta blocker. Smoking cessation  counseling. Cardiac Rehabilitation And Case Management Consultation  If hemodynamic stable, would consider fast-track discharge  Would consider noninvasive evaluation of the Ramus Intermedius lesion in the absence of any recurrent anginal pain.2.   2. Echocardiogram 02/06/2015   Left ventricle: Moderately severe hypokinesis of mid  anterolateral segment, apical lateral segment, mid/apical  anterior segments. EF is 40%. The cavity size was normal. Wall  thickness was increased in a pattern of mild LVH. - Aortic valve: Sclerosis without stenosis. There was no  significant regurgitation. - Right ventricle: The cavity size was normal. Systolic function  was normal.   Lab Results  Basic Metabolic Panel:  Recent Labs Lab 12/10/15 1836 12/11/15 0416  NA 137 137  K 3.6 3.6  CL 103 102  CO2 23 24  GLUCOSE 153* 205*  BUN 13 12  CREATININE 0.90 0.81  CALCIUM 9.0 8.3*    Liver Function Tests:  Recent Labs Lab 12/11/15 0416  AST 22  ALT 37  ALKPHOS 88  BILITOT 1.0  PROT 6.4*  ALBUMIN 3.3*    CBC:  Recent Labs Lab 12/10/15 1836 12/11/15 0416  WBC 15.9* 12.3*  HGB 14.6 13.6  HCT 43.5 41.4  MCV 86.5 86.6  PLT 234 219    Cardiac Enzymes:  Recent Labs Lab 12/10/15 2158 12/11/15 0416  TROPONINI <0.03 <0.03     Radiology: Dg Chest 2 View  12/10/2015  CLINICAL DATA:  Chest pain with shortness of breath and weakness, coronary artery disease post MI June 2016 EXAM: CHEST  2 VIEW COMPARISON:  07/09/2015 FINDINGS: Upper normal heart size. RIGHT-side aortic arch again identified. Mediastinal contours and pulmonary vascularity otherwise normal. Lungs grossly clear. No pulmonary infiltrate, pleural effusion or pneumothorax. Bones demineralized. IMPRESSION: RIGHT-side aortic arch. No definite acute abnormalities. Electronically Signed   By: Lavonia Dana M.D.   On: 12/10/2015 19:15     ECG: Normal sinus rhythm with lateral Q waves. No acute ST-T wave  abnormalities.   Impression and Recommendations  1. Chest Pain: atypical features, with subjective description of pain very similar to pain he experienced during MI in June 2016. However, the pain has been constant and associated with breathing. The patient states he used due to Lera inhaler which helped to relieve the pain. During this hospitalization. He states nitroglycerin helped temporarily but the pain returned and has been constant. The pain was associated with fatigue, but no diaphoresis, dizziness, or nausea and vomiting.  Troponin has been negative 2, EKG revealed no acute ST-T wave changes, arguing against ACS. It is noted that the patient has a potassium of 3.6. Recommend keeping potassium of 4.0 for cardiac patients. We will check magnesium.  2. Coronary artery disease: Patient with 100% stenosed first diagonal with a Promus 2.5 x 16 mm drug-eluting stent placed with 0% residual stenosis post intervention. The patient had a 70% stenosis of ramus lesion and a mid RCA 40% stenosis, with distal 45% stenosis. The patient had anterior and apical hypokinesis with elevated LVEDP. It was suggested the patient had recurrent pain he should have a nuclear medicine study to evaluate for ischemia in the territories of RCA and ramus.  Continue aspirin, Plavix, and statin therapy. Consider nuclear medicine stress test. Pain is associated with inspiration and expiration, and doubt cardiac etiology at this time. We will discuss further with Dr. Kathrine Rieves concerning ischemic testing in this setting.  3. Pain with breathing: Cannot rule out musculoskeletal versus ipleurisy. White blood cells are elevated. This may be related to his steroid inhaler. Patient does find relief with its use.  4. Systolic dysfunction: Most recent EF revealed decreased systolic function of 78%. Can consider repeating echocardiogram for changes in LV systolic function. He does not appear to be decompensated at this  time.     Signed: Phill Myron. Lawrence NP [place]  12/11/2015, 8:26 AM Co-Sign MD  Patient seen and discussed with NP Purcell Nails, I agree with her documentation above. 50 yo male history of CAD with lateral STEMI 01/2015 where he received a DES to D1, there was a 70% ramus and 40% mid RCA that was medically managed. Echo at that time with LVEF 40%, lateral wall hypokinesis. Has not been on beta blocker due to history of sinus pause and bradycardia. No ACE-I given history of severe angioedema, no ARB due to risk of cross reactivity. Cretor reportedly caused anxiety, he has had difficulty affording statin and has been on atorva '20mg'$  daily. He also has a history of COPD and HL. Admitted with atypical chest pain.  He reports episode of 8/10 chest pressure starting yesterday at 430pm while at rest. Pain has been constant since  that time other than a brief 15 minute interval where it resolved. Pain is worst with deep breathing, chest wall is tender to palpation. He reports Friday and Saturday planting 4 trees and 3 rosebushes using a post digger involving heavy upper body muscular stress.   K 3.6, Cr 0.9, WBC 12.3, Hgb 13.6, plt 219, Mg 1.7, trop neg x 3 EKG poor baseline, SR, no acute ischemic changes CXR with right aortic arch, no acute process   Atypical chest pain in duration, worst with respiration, reproducible to palpation. Negative workup for ACS by EKG and enzymes, repeat EKG pending. Heavy upper body exertion over the last few days, likely MSK related pain. No plans for further cardiac testing at this time. Continue current meds, he has multiple medication limiations as described above. We will sign off inpatient care.   [name] MD

## 2015-12-11 NOTE — Discharge Summary (Signed)
Physician Discharge Summary  Randy Hebert ZOX:[id number] DOB: March 08, 1966 DOA: 12/10/2015  PCP: Soyla Dryer, PA-C  Admit date: 12/10/2015 Discharge date: 12/11/2015  Time spent: 45 minutes  Recommendations for Outpatient Follow-up:  -Will be discharged home today. -Advised to follow-up with primary care provider in 2 weeks.   Discharge Diagnoses:  Active Problems:   Essential hypertension   CAD (coronary artery disease)   Hyperlipidemia   Chest pain   Discharge Condition: Stable and improved  Filed Weights   12/10/15 1830 12/10/15 2224  Weight: 99.791 kg (220 lb) 102.15 kg (225 lb 3.2 oz)    History of present illness:  As per Dr. Darrick Hebert on 4/10: 50 year old male who  has a past medical history of Neck pain; Otitis media; Pleurisy; CAD (coronary artery disease); Sinus pause; and MI (myocardial infarction) ([place]). Ray presents to the hospital with complaints of chest pain which started around 5 PM this evening. As per patient he was just sitting in his porch and drinking beer when he experienced chest pain. The pain was similar to what he had experienced when he had MI so he got concerned and came to the ED for further evaluation. In the ED chest pain was relieved after patient received 3 doses of nitroglycerin and came back after the nitroglycerin wore off. Cardec enzymes are negative. EKG showed sinus tachycardia. Patient also complained of clamminess with chest pain, and shortness of breath. He had some nausea but no vomiting. Denies diarrhea. Denies any fever or dysuria.  Hospital Course:   Chest pain -Has ruled out for acute coronary syndrome by the way of negative troponins and EKG that does not demonstrate acute ischemic abnormalities. -Was seen in consultation by cardiology who believes that no further cardiac workup is necessary. -Family is believed to be musculoskeletal in origin. He admits to recent heavy work in his yard using a lot of his upper body  strength.  Rest of chronic medical conditions have been stable and her medications have not been altered.  Procedures:  None   Consultations:  Cardiology  Discharge Instructions  Discharge Instructions    Diet - low sodium heart healthy    Complete by:  As directed      Increase activity slowly    Complete by:  As directed             Medication List    STOP taking these medications        dexamethasone 4 MG tablet  Commonly known as:  DECADRON     HYDROcodone-acetaminophen 7.5-325 MG tablet  Commonly known as:  NORCO     methocarbamol 500 MG tablet  Commonly known as:  ROBAXIN     sodium chloride 0.65 % Soln nasal spray  Commonly known as:  OCEAN      TAKE these medications        albuterol 108 (90 Base) MCG/ACT inhaler  Commonly known as:  PROVENTIL HFA;VENTOLIN HFA  Inhale 2 puffs into the lungs every 6 (six) hours as needed for wheezing or shortness of breath.     amLODipine 5 MG tablet  Commonly known as:  NORVASC  TAKE ONE TABLET BY MOUTH ONCE DAILY     aspirin EC 81 MG tablet  Take 81 mg by mouth daily.     atorvastatin 20 MG tablet  Commonly known as:  LIPITOR  Take 20 mg by mouth daily.     BENGAY EX  Apply 1 application topically daily.  clopidogrel 75 MG tablet  Commonly known as:  PLAVIX  Take 1 tablet (75 mg total) by mouth daily.     ibuprofen 200 MG tablet  Commonly known as:  ADVIL,MOTRIN  Take 600-800 mg by mouth every 6 (six) hours as needed for moderate pain.     isosorbide mononitrate 30 MG 24 hr tablet  Commonly known as:  IMDUR  Take 0.5 tablets (15 mg total) by mouth daily.     mometasone-formoterol 100-5 MCG/ACT Aero  Commonly known as:  DULERA  Inhale 2 puffs into the lungs 2 (two) times daily.     multivitamin with minerals Tabs tablet  Take 1 tablet by mouth daily.     nitroGLYCERIN 0.4 MG SL tablet  Commonly known as:  NITROSTAT  Place 1 tablet (0.4 mg total) under the tongue every 5 (five) minutes as  needed for chest pain.       Allergies  Allergen Reactions  . Lisinopril Shortness Of Breath and Swelling    Angioedema, required intubation and mechanical ventilation  . Amoxicillin Nausea And Vomiting  . Carvedilol Other (See Comments)    Sinus pause on telemetry >3 seconds. Longest one 9 sec. No AV nodal agent       Follow-up Information    Follow up with Soyla Dryer, PA-C. Schedule an appointment as soon as possible for a visit in 2 weeks.   Specialty:  Physician Assistant   Contact information:   [address] [place] [address] [phone]        The results of significant diagnostics from this hospitalization (including imaging, microbiology, ancillary and laboratory) are listed below for reference.    Significant Diagnostic Studies: Dg Chest 2 View  12/10/2015  CLINICAL DATA:  Chest pain with shortness of breath and weakness, coronary artery disease post MI June 2016 EXAM: CHEST  2 VIEW COMPARISON:  07/09/2015 FINDINGS: Upper normal heart size. RIGHT-side aortic arch again identified. Mediastinal contours and pulmonary vascularity otherwise normal. Lungs grossly clear. No pulmonary infiltrate, pleural effusion or pneumothorax. Bones demineralized. IMPRESSION: RIGHT-side aortic arch. No definite acute abnormalities. Electronically Signed   By: Lavonia Dana M.D.   On: 12/10/2015 19:15    Microbiology: No results found for this or any previous visit (from the past 240 hour(s)).   Labs: Basic Metabolic Panel:  Recent Labs Lab 12/10/15 1836 12/11/15 0416 12/11/15 0956  NA 137 137  --   K 3.6 3.6  --   CL 103 102  --   CO2 23 24  --   GLUCOSE 153* 205*  --   BUN 13 12  --   CREATININE 0.90 0.81  --   CALCIUM 9.0 8.3*  --   MG  --   --  1.7   Liver Function Tests:  Recent Labs Lab 12/11/15 0416  AST 22  ALT 37  ALKPHOS 88  BILITOT 1.0  PROT 6.4*  ALBUMIN 3.3*   No results for input(s): LIPASE, AMYLASE in the last 168 hours. No results for  input(s): AMMONIA in the last 168 hours. CBC:  Recent Labs Lab 12/10/15 1836 12/11/15 0416  WBC 15.9* 12.3*  HGB 14.6 13.6  HCT 43.5 41.4  MCV 86.5 86.6  PLT 234 219   Cardiac Enzymes:  Recent Labs Lab 12/10/15 2158 12/11/15 0416 12/11/15 0956  TROPONINI <0.03 <0.03 <0.03   BNP: BNP (last 3 results) No results for input(s): BNP in the last 8760 hours.  ProBNP (last 3 results) No results for input(s):  PROBNP in the last 8760 hours.  CBG: No results for input(s): GLUCAP in the last 168 hours.     SignedLelon Frohlich  [facility] Pager: [phone] 12/11/2015, 2:07 PM

## 2015-12-19 ENCOUNTER — Ambulatory Visit: Payer: Self-pay | Admitting: Physician Assistant

## 2015-12-19 ENCOUNTER — Encounter: Payer: Self-pay | Admitting: Physician Assistant

## 2015-12-19 VITALS — BP 142/94 | HR 90 | Temp 97.9°F | Ht 65.0 in | Wt 239.2 lb

## 2015-12-19 DIAGNOSIS — E785 Hyperlipidemia, unspecified: Secondary | ICD-10-CM

## 2015-12-19 DIAGNOSIS — M25551 Pain in right hip: Secondary | ICD-10-CM

## 2015-12-19 DIAGNOSIS — I2510 Atherosclerotic heart disease of native coronary artery without angina pectoris: Secondary | ICD-10-CM

## 2015-12-19 DIAGNOSIS — E669 Obesity, unspecified: Secondary | ICD-10-CM

## 2015-12-19 DIAGNOSIS — J449 Chronic obstructive pulmonary disease, unspecified: Secondary | ICD-10-CM

## 2015-12-19 DIAGNOSIS — I10 Essential (primary) hypertension: Secondary | ICD-10-CM

## 2015-12-19 DIAGNOSIS — F17219 Nicotine dependence, cigarettes, with unspecified nicotine-induced disorders: Secondary | ICD-10-CM

## 2015-12-19 MED ORDER — CYCLOBENZAPRINE HCL 10 MG PO TABS: 10.0000 mg | ORAL_TABLET | Freq: Three times a day (TID) | ORAL | Status: DC | PRN

## 2015-12-19 NOTE — Progress Notes (Signed)
BP 142/94 mmHg  Pulse 90  Temp(Src) 97.9 F (36.6 C)  Ht '5\' 5"'$  (1.651 m)  Wt 239 lb 4 oz (108.523 kg)  BMI 39.81 kg/m2  SpO2 93%   Subjective:    Patient ID: Randy Hebert, male    DOB: 01-Sep-1966, 50 y.o.   MRN: [id number]  HPI: Randy Hebert is a 50 y.o. male presenting on 12/19/2015 for Follow-up   HPI Chief Complaint  Patient presents with  . Follow-up    pt wants refill on dexamethasone and methocarbamol which really helped with his hip pain    Pt turned in more paperwork last week to [facility] in efforts to get recertified for [facility] discount  Pt is still smoking    Relevant past medical, surgical, family and social history reviewed and updated as indicated. Interim medical history since our last visit reviewed. Allergies and medications reviewed and updated.   Current outpatient prescriptions:  .  albuterol (PROVENTIL HFA;VENTOLIN HFA) 108 (90 BASE) MCG/ACT inhaler, Inhale 2 puffs into the lungs every 6 (six) hours as needed for wheezing or shortness of breath., Disp: 3 Inhaler, Rfl: 1 .  amLODipine (NORVASC) 5 MG tablet, TAKE ONE TABLET BY MOUTH ONCE DAILY, Disp: 30 tablet, Rfl: 3 .  aspirin EC 81 MG tablet, Take 81 mg by mouth daily., Disp: , Rfl:  .  atorvastatin (LIPITOR) 20 MG tablet, Take 20 mg by mouth daily., Disp: , Rfl: 2 .  clopidogrel (PLAVIX) 75 MG tablet, Take 1 tablet (75 mg total) by mouth daily., Disp: 90 tablet, Rfl: 3 .  ibuprofen (ADVIL,MOTRIN) 200 MG tablet, Take 600-800 mg by mouth every 6 (six) hours as needed for moderate pain. , Disp: , Rfl:  .  isosorbide mononitrate (IMDUR) 30 MG 24 hr tablet, Take 0.5 tablets (15 mg total) by mouth daily., Disp: 45 tablet, Rfl: 3 .  Menthol, Topical Analgesic, (BENGAY EX), Apply 1 application topically daily., Disp: , Rfl:  .  mometasone-formoterol (DULERA) 100-5 MCG/ACT AERO, Inhale 2 puffs into the lungs 2 (two) times daily., Disp: 3 Inhaler, Rfl: 1 .  Multiple Vitamin (MULTIVITAMIN WITH MINERALS)  TABS tablet, Take 1 tablet by mouth daily., Disp: , Rfl:  .  nitroGLYCERIN (NITROSTAT) 0.4 MG SL tablet, Place 1 tablet (0.4 mg total) under the tongue every 5 (five) minutes as needed for chest pain., Disp: 25 tablet, Rfl: 3 .  Phenyleph-Doxyl-DM-Aspirin (ALKA-SELTZER PLUS NIGHT COLD PO), Take by mouth., Disp: , Rfl:    Review of Systems  Constitutional: Positive for diaphoresis and appetite change. Negative for fever, chills, fatigue and unexpected weight change.  HENT: Positive for sneezing and voice change (improving). Negative for congestion, dental problem, ear pain, facial swelling, hearing loss, mouth sores, sore throat and trouble swallowing.   Eyes: Positive for redness and itching. Negative for pain, discharge and visual disturbance.  Respiratory: Positive for cough, shortness of breath and wheezing. Negative for choking.   Cardiovascular: Negative for chest pain, palpitations and leg swelling.  Gastrointestinal: Negative for vomiting, abdominal pain, diarrhea, constipation and blood in stool.  Endocrine: Positive for heat intolerance. Negative for cold intolerance and polydipsia.  Genitourinary: Negative for dysuria, hematuria and decreased urine volume.  Musculoskeletal: Positive for back pain, arthralgias and gait problem.  Skin: Negative for rash.  Allergic/Immunologic: Positive for environmental allergies.  Neurological: Negative for seizures, syncope, light-headedness and headaches.  Hematological: Negative for adenopathy.  Psychiatric/Behavioral: Negative for suicidal ideas, dysphoric mood and agitation. The patient is not nervous/anxious.  Per HPI unless specifically indicated above     Objective:    BP 142/94 mmHg  Pulse 90  Temp(Src) 97.9 F (36.6 C)  Ht '5\' 5"'$  (1.651 m)  Wt 239 lb 4 oz (108.523 kg)  BMI 39.81 kg/m2  SpO2 93%  Wt Readings from Last 3 Encounters:  12/19/15 239 lb 4 oz (108.523 kg)  12/10/15 225 lb 3.2 oz (102.15 kg)  11/15/15 220 lb (99.791  kg)    Physical Exam  Constitutional: He is oriented to person, place, and time. He appears well-developed and well-nourished.  HENT:  Head: Normocephalic and atraumatic.  Neck: Neck supple.  Cardiovascular: Normal rate and regular rhythm.   Pulmonary/Chest: Effort normal and breath sounds normal. He has no wheezes.  Abdominal: Soft. Bowel sounds are normal. There is no hepatosplenomegaly. There is no tenderness.  obese  Musculoskeletal: He exhibits no edema.  Lymphadenopathy:    He has no cervical adenopathy.  Neurological: He is alert and oriented to person, place, and time.  Skin: Skin is warm and dry.  Psychiatric: He has a normal mood and affect. His behavior is normal.  Vitals reviewed.       Assessment & Plan:   Encounter Diagnoses  Name Primary?  . Chronic obstructive pulmonary disease, unspecified COPD type ([place]) Yes  . Cigarette nicotine dependence with nicotine-induced disorder   . Right hip pain   . Hyperlipidemia   . Essential hypertension   . Coronary artery disease involving native coronary artery of native heart without angina pectoris   . Obesity, unspecified      -Stop alka seltzer due to increased bp -counseled on smoking cessation -Check lipids 3 months -Flexeril prn- no driving on it due to sedation- pt aware no refills will be given -still working on getting pt in with orthopedics. Will have to check on his [facility] discount application -F/u 3 months.  RTO sooner prn

## 2015-12-20 ENCOUNTER — Encounter: Payer: Self-pay | Admitting: Orthopedic Surgery

## 2015-12-26 ENCOUNTER — Ambulatory Visit: Payer: Self-pay | Admitting: Physician Assistant

## 2015-12-31 ENCOUNTER — Encounter: Payer: Self-pay | Admitting: Orthopedic Surgery

## 2015-12-31 ENCOUNTER — Ambulatory Visit (INDEPENDENT_AMBULATORY_CARE_PROVIDER_SITE_OTHER): Payer: Self-pay | Admitting: Orthopedic Surgery

## 2015-12-31 VITALS — BP 139/89 | Ht 65.0 in | Wt 239.0 lb

## 2015-12-31 DIAGNOSIS — M1611 Unilateral primary osteoarthritis, right hip: Secondary | ICD-10-CM

## 2015-12-31 NOTE — Patient Instructions (Signed)
REFERRAL TO DR Ninfa Linden @ [facility]

## 2015-12-31 NOTE — Progress Notes (Signed)
Chief Complaint  Patient presents with  . Hip Problem    DJD RIGHT HIP   HPI 50 year old male presents with a one-year history of right hip pain in the groin and anterior thigh. His pain is got worse over a year. He has history of cardiac disease and cardiac stent placement approximately a year ago.  He basically has constant worsening right groin pain which is severe sharp and requires the use of a cane for partial relief has difficulty with activities of daily living like getting dressed   Review of Systems  Respiratory: Negative for shortness of breath.   Cardiovascular: Negative for chest pain.  Musculoskeletal: Positive for back pain.  Neurological: Negative for tingling.    Past Medical History  Diagnosis Date  . Neck pain   . Otitis media   . Pleurisy   . CAD (coronary artery disease)     lateral STEMI 02/06/2015 00% D1 occlusion treated with Promus Premier 2.5 mm x 16 mm DES, 70% ramus stenosis, 40% mid RCA stenosis, 45% distal RCA stenosis, EF 45-50%  . Sinus pause     9 sec sinus pause on telemetry after started on coreg after MI, avoid AV nodal blocking agent  . MI (myocardial infarction) [facility])     Past Surgical History  Procedure Laterality Date  . Incision / drainage hand / finger    . Cardiac catheterization N/A 02/06/2015    Procedure: Left Heart Cath and Coronary Angiography;  Surgeon: Leonie Man, MD;  Location: [place] [facility];  Service: Cardiovascular;  Laterality: N/A;  . Cardiac catheterization N/A 02/06/2015    Procedure: Coronary Stent Intervention;  Surgeon: Leonie Man, MD;  Location: [place] [facility];  Service: Cardiovascular;  Laterality: N/A;   Family History  Problem Relation Age of Onset  . Heart attack Father   . Stroke Father    Social History  Substance Use Topics  . Smoking status: Current Some Day Smoker -- 0.50 packs/day for 46 years    Types: Cigarettes    Start date: 06/07/1979  . Smokeless tobacco: Former Systems developer    Types:  Chew  . Alcohol Use: 3.0 - 3.6 oz/week    5-6 Shots of liquor, 0 Standard drinks or equivalent per week    Current outpatient prescriptions:  .  albuterol (PROVENTIL HFA;VENTOLIN HFA) 108 (90 BASE) MCG/ACT inhaler, Inhale 2 puffs into the lungs every 6 (six) hours as needed for wheezing or shortness of breath., Disp: 3 Inhaler, Rfl: 1 .  amLODipine (NORVASC) 5 MG tablet, TAKE ONE TABLET BY MOUTH ONCE DAILY, Disp: 30 tablet, Rfl: 3 .  aspirin EC 81 MG tablet, Take 81 mg by mouth daily., Disp: , Rfl:  .  atorvastatin (LIPITOR) 20 MG tablet, Take 20 mg by mouth daily., Disp: , Rfl: 2 .  clopidogrel (PLAVIX) 75 MG tablet, Take 1 tablet (75 mg total) by mouth daily., Disp: 90 tablet, Rfl: 3 .  cyclobenzaprine (FLEXERIL) 10 MG tablet, Take 1 tablet (10 mg total) by mouth every 8 (eight) hours as needed for muscle spasms., Disp: 30 tablet, Rfl: 0 .  ibuprofen (ADVIL,MOTRIN) 200 MG tablet, Take 600-800 mg by mouth every 6 (six) hours as needed for moderate pain. , Disp: , Rfl:  .  isosorbide mononitrate (IMDUR) 30 MG 24 hr tablet, Take 0.5 tablets (15 mg total) by mouth daily., Disp: 45 tablet, Rfl: 3 .  Menthol, Topical Analgesic, (BENGAY EX), Apply 1 application topically daily., Disp: , Rfl:  .  mometasone-formoterol (DULERA) 100-5 MCG/ACT AERO, Inhale 2 puffs into the lungs 2 (two) times daily., Disp: 3 Inhaler, Rfl: 1 .  Multiple Vitamin (MULTIVITAMIN WITH MINERALS) TABS tablet, Take 1 tablet by mouth daily., Disp: , Rfl:  .  nitroGLYCERIN (NITROSTAT) 0.4 MG SL tablet, Place 1 tablet (0.4 mg total) under the tongue every 5 (five) minutes as needed for chest pain., Disp: 25 tablet, Rfl: 3 .  Phenyleph-Doxyl-DM-Aspirin (ALKA-SELTZER PLUS NIGHT COLD PO), Take by mouth., Disp: , Rfl:   BP 139/89 mmHg  Ht '5\' 5"'$  (1.651 m)  Wt 239 lb (108.41 kg)  BMI 39.77 kg/m2  Physical Exam  Constitutional: He is oriented to person, place, and time. He appears well-developed and well-nourished. No distress.   Cardiovascular: Normal rate and intact distal pulses.   Neurological: He is alert and oriented to person, place, and time.  Skin: Skin is warm and dry. No rash noted. He is not diaphoretic. No erythema. No pallor.  Psychiatric: He has a normal mood and affect. His behavior is normal. Judgment and thought content normal.    Ortho Exam We observe him walking with a cane in the right hand  We note painful hip flexion and 90 we also note painful internal rotation 5. Muscle tone is normal hip is stable reduced right leg skin normal. Peripherally no peripheral edema. Sensation in the right leg normal  Left hip flexion normal range of motion stability and strength tests are normal skin is normal. 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

## 2016-01-03 ENCOUNTER — Other Ambulatory Visit: Payer: Self-pay | Admitting: *Deleted

## 2016-01-03 ENCOUNTER — Telehealth: Payer: Self-pay | Admitting: *Deleted

## 2016-01-03 DIAGNOSIS — M1611 Unilateral primary osteoarthritis, right hip: Secondary | ICD-10-CM

## 2016-01-03 NOTE — Telephone Encounter (Signed)
REFERRAL FAXED TO [facility]

## 2016-01-07 ENCOUNTER — Other Ambulatory Visit: Payer: Self-pay | Admitting: Physician Assistant

## 2016-01-15 ENCOUNTER — Encounter: Payer: Self-pay | Admitting: Cardiology

## 2016-01-15 ENCOUNTER — Ambulatory Visit (INDEPENDENT_AMBULATORY_CARE_PROVIDER_SITE_OTHER): Payer: Self-pay | Admitting: Cardiology

## 2016-01-15 VITALS — BP 136/100 | HR 96 | Ht 65.0 in | Wt 238.0 lb

## 2016-01-15 DIAGNOSIS — I2510 Atherosclerotic heart disease of native coronary artery without angina pectoris: Secondary | ICD-10-CM

## 2016-01-15 DIAGNOSIS — I10 Essential (primary) hypertension: Secondary | ICD-10-CM

## 2016-01-15 DIAGNOSIS — E785 Hyperlipidemia, unspecified: Secondary | ICD-10-CM

## 2016-01-15 DIAGNOSIS — Z01810 Encounter for preprocedural cardiovascular examination: Secondary | ICD-10-CM

## 2016-01-15 NOTE — Patient Instructions (Signed)
Medication Instructions:  STOP PLAVIX June 7TH  Labwork: NONE  Testing/Procedures: NONE  Follow-Up: Your physician wants you to follow-up in: 6  MONTHS .  You will receive a reminder letter in the mail two months in advance. If you don't receive a letter, please call our office to schedule the follow-up appointment.   Any Other Special Instructions Will Be Listed Below (If Applicable).     If you need a refill on your cardiac medications before your next appointment, please call your pharmacy.

## 2016-01-15 NOTE — Progress Notes (Addendum)
Patient ID: Randy Hebert, male   DOB: 12/24/1965, 50 y.o.   MRN: [id number]     Clinical Summary Randy Hebert is a 50 y.o.male seen today for follow up of the following medical problems.  1. CAD - admit 01/2015 with lateral STEMI, received DES to D1. There was a 70% ramus and 40% mid RCA that was medically managed - 01/2015 echo LVEF 40%, lateral wall hypokinesis - no beta blocker due to history of sinus pause and bradycardia. No ACE-I given history of severe angioedema, no ARB due to risk of cross reactivity - admit 12/2015 with atypical chest pain, worst with palpatoin and movement. Had done some heavy lifting the day it started, negative workup for ACS.  Since that time no recurrent symptoms - ran out of meds a few days ago, going to get refilled today.    2. SOB/COPD - 07/2015 PFTs: mild to mod ventilatory defect with small airway obstruction.  - 07/2015 CT PE no PE, though suboptimal study - 07/2015 CXR no acute process.  PFTs with mild to mod vent defect, ABGs showed borderline resting hypoxemia.      3. Hyperlipidemia - he reports anxiety on crestor, he stopped taking.  - he has had troubles affording alternative statins. Currently on atorva '20mg'$  daily.   4. Preoperative evaluation - he is being considered for right hip surgery, though he is still working on the funding for this procedure.    Past Medical History  Diagnosis Date  . Neck pain   . Otitis media   . Pleurisy   . CAD (coronary artery disease)     lateral STEMI 02/06/2015 00% D1 occlusion treated with Promus Premier 2.5 mm x 16 mm DES, 70% ramus stenosis, 40% mid RCA stenosis, 45% distal RCA stenosis, EF 45-50%  . Sinus pause     9 sec sinus pause on telemetry after started on coreg after MI, avoid AV nodal blocking agent  . MI (myocardial infarction) ([place])      Allergies  Allergen Reactions  . Lisinopril Shortness Of Breath and Swelling    Angioedema, required intubation and mechanical ventilation  .  Amoxicillin Nausea And Vomiting  . Carvedilol Other (See Comments)    Sinus pause on telemetry >3 seconds. Longest one 9 sec. No AV nodal agent     Current Outpatient Prescriptions  Medication Sig Dispense Refill  . albuterol (PROVENTIL HFA;VENTOLIN HFA) 108 (90 BASE) MCG/ACT inhaler Inhale 2 puffs into the lungs every 6 (six) hours as needed for wheezing or shortness of breath. 3 Inhaler 1  . amLODipine (NORVASC) 5 MG tablet TAKE ONE TABLET BY MOUTH ONCE DAILY 30 tablet 3  . aspirin EC 81 MG tablet Take 81 mg by mouth daily.    Marland Kitchen atorvastatin (LIPITOR) 20 MG tablet Take 20 mg by mouth daily.  2  . clopidogrel (PLAVIX) 75 MG tablet Take 1 tablet (75 mg total) by mouth daily. 90 tablet 3  . cyclobenzaprine (FLEXERIL) 10 MG tablet Take 1 tablet (10 mg total) by mouth every 8 (eight) hours as needed for muscle spasms. 30 tablet 0  . ibuprofen (ADVIL,MOTRIN) 200 MG tablet Take 600-800 mg by mouth every 6 (six) hours as needed for moderate pain.     . isosorbide mononitrate (IMDUR) 30 MG 24 hr tablet Take 0.5 tablets (15 mg total) by mouth daily. 45 tablet 3  . Menthol, Topical Analgesic, (BENGAY EX) Apply 1 application topically daily.    . mometasone-formoterol (DULERA) 100-5 MCG/ACT AERO Inhale  2 puffs into the lungs 2 (two) times daily. 3 Inhaler 1  . Multiple Vitamin (MULTIVITAMIN WITH MINERALS) TABS tablet Take 1 tablet by mouth daily.    . nitroGLYCERIN (NITROSTAT) 0.4 MG SL tablet Place 1 tablet (0.4 mg total) under the tongue every 5 (five) minutes as needed for chest pain. 25 tablet 3  . Phenyleph-Doxyl-DM-Aspirin (ALKA-SELTZER PLUS NIGHT COLD PO) Take by mouth.     No current facility-administered medications for this visit.     Past Surgical History  Procedure Laterality Date  . Incision / drainage hand / finger    . Cardiac catheterization N/A 02/06/2015    Procedure: Left Heart Cath and Coronary Angiography;  Surgeon: Leonie Man, MD;  Location: [facility];  Service:  Cardiovascular;  Laterality: N/A;  . Cardiac catheterization N/A 02/06/2015    Procedure: Coronary Stent Intervention;  Surgeon: Leonie Man, MD;  Location: [facility];  Service: Cardiovascular;  Laterality: N/A;     Allergies  Allergen Reactions  . Lisinopril Shortness Of Breath and Swelling    Angioedema, required intubation and mechanical ventilation  . Amoxicillin Nausea And Vomiting  . Carvedilol Other (See Comments)    Sinus pause on telemetry >3 seconds. Longest one 9 sec. No AV nodal agent      Family History  Problem Relation Age of Onset  . Heart attack Father   . Stroke Father      Social History Randy Hebert reports that he has been smoking Cigarettes.  He started smoking about 36 years ago. He has a 23 pack-year smoking history. He has quit using smokeless tobacco. His smokeless tobacco use included Chew. Randy Hebert reports that he drinks about 3.0 - 3.6 oz of alcohol per week.   Review of Systems CONSTITUTIONAL: No weight loss, fever, chills, weakness or fatigue.  HEENT: Eyes: No visual loss, blurred vision, double vision or yellow sclerae.No hearing loss, sneezing, congestion, runny nose or sore throat.  SKIN: No rash or itching.  CARDIOVASCULAR: per HPI RESPIRATORY: No shortness of breath, cough or sputum.  GASTROINTESTINAL: No anorexia, nausea, vomiting or diarrhea. No abdominal pain or blood.  GENITOURINARY: No burning on urination, no polyuria NEUROLOGICAL: No headache, dizziness, syncope, paralysis, ataxia, numbness or tingling in the extremities. No change in bowel or bladder control.  MUSCULOSKELETAL: No muscle, back pain, joint pain or stiffness.  LYMPHATICS: No enlarged nodes. No history of splenectomy.  PSYCHIATRIC: No history of depression or anxiety.  ENDOCRINOLOGIC: No reports of sweating, cold or heat intolerance. No polyuria or polydipsia.  Marland Kitchen   Physical Examination Filed Vitals:   01/15/16 1538  BP: 136/100  Pulse: 96   Filed  Vitals:   01/15/16 1538  Height: '5\' 5"'$  (1.651 m)  Weight: 238 lb (107.956 kg)    Gen: resting comfortably, no acute distress HEENT: no scleral icterus, pupils equal round and reactive, no palptable cervical adenopathy,  CV: RRR, no m/r/g, no jvd Resp: Clear to auscultation bilaterally GI: abdomen is soft, non-tender, non-distended, normal bowel sounds, no hepatosplenomegaly MSK: extremities are warm, no edema.  Skin: warm, no rash Neuro:  no focal deficits Psych: appropriate affect   Diagnostic Studies 01/2015 cath 1. 1st Diag lesion, 100% stenosed. A Promus Premier 2.5 mm x 16 mm drug-eluting stent was placed. There is a 0% residual stenosis post intervention. 2. Ramus lesion, 70% stenosed. 3. Mid RCA lesion, 40% stenosed. Dist RCA lesion, 45% stenosed. 4. Mild to moderately reduced LVEF with anterolateral and apical hypokinesis  and elevated LVEDP  Abnormal anatomy with equal sized Diagonal and LAD Melissa Tomaselli but extensive RCA. Successful PCI of the First Diagonal Laine Giovanetti.  Recommendations:  Standard post radial cath/PCI TR band removal  Dual independent therapy for minimum one year.  Check 2-D echocardiogram to better assess EF  Add statin and beta blocker. Smoking cessation counseling. Cardiac Rehabilitation And Case Management Consultation  If hemodynamic stable, would consider fast-track discharge  Would consider noninvasive evaluation of the Ramus Intermedius lesion in the absence of any recurrent anginal pain.  01/2015 echo Study Conclusions  - Left ventricle: Moderately severe hypokinesis of mid anterolateral segment, apical lateral segment, mid/apical anterior segments. EF is 40%. The cavity size was normal. Wall thickness was increased in a pattern of mild LVH. - Aortic valve: Sclerosis without stenosis. There was no significant regurgitation. - Right ventricle: The cavity size was normal. Systolic function was normal.  07/2015 CT PE IMPRESSION: 1.  Suboptimal contrast bolus timing in the pulmonary arterial tree, poor in the lobar vessels other than the right lower lobe. No central pulmonary embolus. If there is continued suspicion of acute PE nuclear medicine V/Q scan may be most helpful. 2. Situs abnormality in the chest with right side aortic arch and descending aorta. Mirror image branching. Other visible situs including cardiac and upper abdominal situs appear normal. 3. Calcified Coronary artery atherosclerosis. Hepatomegaly with fatty liver disease.  07/2015 PFTs: mild to mod ventilatory defect with small airway obstruction, moderately reduced DLCO.     Assessment and Plan   1. CAD - no current symptoms - continue current meds, DAPT until 01/2016  2. COPD - followed by pcp  3. Hyperlipideima - reports anxiety on crestor. Difficulty affording statins, currently on atorva '20mg'$  daily. We will continue  4. Preoperative evaluation - from cardiac standpoint would recommend waiting until 01/2016 for hip surgery, at that time he will be completed with his plavix.  5. HTN - bp mildly elevated today however he has not taken his meds, continue to monitor    F/u 6 months     Arnoldo Lenis, M.D.  05/27/16 Addendum Received request for cardiac clearance for possible hip replacement. If has been over a year since his MI, he is now off plavix. Ok to hold aspirin if needed for surgery, please resume as soon as possible after. From cardiac stanpdoint recommend proceeding with hip replacement   [name] MD

## 2016-01-16 ENCOUNTER — Telehealth: Payer: Self-pay | Admitting: Student

## 2016-01-16 ENCOUNTER — Other Ambulatory Visit: Payer: Self-pay | Admitting: Physician Assistant

## 2016-01-16 MED ORDER — CYCLOBENZAPRINE HCL 10 MG PO TABS: 10.0000 mg | ORAL_TABLET | Freq: Three times a day (TID) | ORAL | Status: DC | PRN

## 2016-01-16 NOTE — Telephone Encounter (Signed)
-----   Message from Soyla Dryer, [place] sent at 01/02/2016  4:40 PM EDT ----- Please contact pt- dr Aline Brochure referred him to [facility] (dr Ninfa Linden) in [place].  We need to find out if pt has medicaid now.  If his only means is [employer] discount, we need to find out if [facility] accepts [employer].  If not, we may need to send him to [facility] thanks

## 2016-01-16 NOTE — Telephone Encounter (Signed)
Contacted pt and he informed me that he has a hearing about his medicaid on May 30, so currently does not have insurance. He also informed us that he was not in fact approved for the cone discount and is unable to apply until aug. Pt was informed that since he does not have medicaid or [place] financial assistance we will refer him to [facility] for possible financial assistance instead of [facility], pt understood. Pt was advised to be close to his phone and await a call from [facility] for financial counseling. Pt understood.

## 2016-03-10 ENCOUNTER — Other Ambulatory Visit: Payer: Self-pay

## 2016-03-10 DIAGNOSIS — E669 Obesity, unspecified: Secondary | ICD-10-CM

## 2016-03-10 DIAGNOSIS — E785 Hyperlipidemia, unspecified: Secondary | ICD-10-CM

## 2016-03-10 DIAGNOSIS — I10 Essential (primary) hypertension: Secondary | ICD-10-CM

## 2016-03-12 LAB — COMPLETE METABOLIC PANEL WITH GFR
ALBUMIN: 4 g/dL (ref 3.6–5.1)
ALT: 45 U/L (ref 9–46)
AST: 33 U/L (ref 10–35)
Alkaline Phosphatase: 124 U/L — ABNORMAL HIGH (ref 40–115)
BUN: 11 mg/dL (ref 7–25)
CALCIUM: 8.8 mg/dL (ref 8.6–10.3)
CO2: 26 mmol/L (ref 20–31)
Chloride: 101 mmol/L (ref 98–110)
Creat: 0.91 mg/dL (ref 0.70–1.33)
Glucose, Bld: 170 mg/dL — ABNORMAL HIGH (ref 65–99)
POTASSIUM: 4.4 mmol/L (ref 3.5–5.3)
Sodium: 138 mmol/L (ref 135–146)
TOTAL PROTEIN: 6.6 g/dL (ref 6.1–8.1)
Total Bilirubin: 0.7 mg/dL (ref 0.2–1.2)

## 2016-03-12 LAB — LIPID PANEL
CHOLESTEROL: 136 mg/dL (ref 125–200)
HDL: 23 mg/dL — AB (ref >=40)
LDL CALC: 57 mg/dL (ref <130)
TRIGLYCERIDES: 281 mg/dL — AB (ref <150)
Total CHOL/HDL Ratio: 5.9 Ratio — ABNORMAL HIGH (ref <=5.0)
VLDL: 56 mg/dL — AB (ref <30)

## 2016-03-13 LAB — HEMOGLOBIN A1C
HEMOGLOBIN A1C: 8.2 % — AB (ref <5.7)
Mean Plasma Glucose: 189 mg/dL

## 2016-03-19 ENCOUNTER — Encounter: Payer: Self-pay | Admitting: Physician Assistant

## 2016-03-19 ENCOUNTER — Ambulatory Visit: Payer: Self-pay | Admitting: Physician Assistant

## 2016-03-19 VITALS — BP 146/94 | HR 98 | Temp 97.9°F | Ht 65.0 in | Wt 238.8 lb

## 2016-03-19 DIAGNOSIS — J449 Chronic obstructive pulmonary disease, unspecified: Secondary | ICD-10-CM

## 2016-03-19 DIAGNOSIS — I2510 Atherosclerotic heart disease of native coronary artery without angina pectoris: Secondary | ICD-10-CM

## 2016-03-19 DIAGNOSIS — M25551 Pain in right hip: Secondary | ICD-10-CM

## 2016-03-19 DIAGNOSIS — S81811A Laceration without foreign body, right lower leg, initial encounter: Secondary | ICD-10-CM

## 2016-03-19 DIAGNOSIS — I10 Essential (primary) hypertension: Secondary | ICD-10-CM

## 2016-03-19 DIAGNOSIS — E785 Hyperlipidemia, unspecified: Secondary | ICD-10-CM

## 2016-03-19 DIAGNOSIS — E669 Obesity, unspecified: Secondary | ICD-10-CM

## 2016-03-19 DIAGNOSIS — E119 Type 2 diabetes mellitus without complications: Secondary | ICD-10-CM

## 2016-03-19 DIAGNOSIS — F17219 Nicotine dependence, cigarettes, with unspecified nicotine-induced disorders: Secondary | ICD-10-CM

## 2016-03-19 MED ORDER — METFORMIN HCL 500 MG PO TABS: 500.0000 mg | ORAL_TABLET | Freq: Two times a day (BID) | ORAL | Status: DC
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
## 2016-03-19 NOTE — Patient Instructions (Signed)

## 2016-04-03 ENCOUNTER — Emergency Department (HOSPITAL_COMMUNITY): Payer: Medicaid Other

## 2016-04-03 ENCOUNTER — Emergency Department (HOSPITAL_COMMUNITY)
Admission: EM | Admit: 2016-04-03 | Discharge: 2016-04-03 | Disposition: A | Discharge status: Home / Self Care | Payer: Medicaid Other | Attending: Emergency Medicine | Admitting: Emergency Medicine

## 2016-04-03 ENCOUNTER — Encounter (HOSPITAL_COMMUNITY): Payer: Self-pay

## 2016-04-03 DIAGNOSIS — F17210 Nicotine dependence, cigarettes, uncomplicated: Secondary | ICD-10-CM | POA: Diagnosis not present

## 2016-04-03 DIAGNOSIS — Z7982 Long term (current) use of aspirin: Secondary | ICD-10-CM | POA: Insufficient documentation

## 2016-04-03 DIAGNOSIS — Z79899 Other long term (current) drug therapy: Secondary | ICD-10-CM | POA: Diagnosis not present

## 2016-04-03 DIAGNOSIS — I2510 Atherosclerotic heart disease of native coronary artery without angina pectoris: Secondary | ICD-10-CM | POA: Diagnosis not present

## 2016-04-03 DIAGNOSIS — M545 Low back pain: Secondary | ICD-10-CM

## 2016-04-03 DIAGNOSIS — Z7984 Long term (current) use of oral hypoglycemic drugs: Secondary | ICD-10-CM | POA: Diagnosis not present

## 2016-04-03 DIAGNOSIS — Z791 Long term (current) use of non-steroidal anti-inflammatories (NSAID): Secondary | ICD-10-CM | POA: Insufficient documentation

## 2016-04-03 DIAGNOSIS — E119 Type 2 diabetes mellitus without complications: Secondary | ICD-10-CM | POA: Insufficient documentation

## 2016-04-03 HISTORY — DX: Dorsalgia, unspecified: M54.9

## 2016-04-03 HISTORY — DX: Type 2 diabetes mellitus without complications: E11.9

## 2016-04-03 LAB — URINALYSIS, ROUTINE W REFLEX MICROSCOPIC
BILIRUBIN URINE: NEGATIVE
Glucose, UA: NEGATIVE mg/dL
Ketones, ur: NEGATIVE mg/dL
Nitrite: NEGATIVE
pH: 5.5 (ref 5.0–8.0)

## 2016-04-03 LAB — URINE MICROSCOPIC-ADD ON

## 2016-04-03 MED ORDER — IBUPROFEN 400 MG PO TABS: 400.0000 mg | ORAL_TABLET | Freq: Three times a day (TID) | ORAL | 0 refills | Status: DC

## 2016-04-03 MED ORDER — IPRATROPIUM-ALBUTEROL 0.5-2.5 (3) MG/3ML IN SOLN
3.0000 mL | Freq: Once | RESPIRATORY_TRACT | Status: AC
  Administered 2016-04-03: 3 mL via RESPIRATORY_TRACT
  Filled 2016-04-03: qty 3

## 2016-04-03 MED ORDER — HYDROCODONE-ACETAMINOPHEN 5-325 MG PO TABS
1.0000 | ORAL_TABLET | Freq: Once | ORAL | Status: AC
  Administered 2016-04-03: 1 via ORAL
  Filled 2016-04-03: qty 1

## 2016-04-03 NOTE — ED Notes (Signed)
Pt tolerated fluids well.  

## 2016-04-03 NOTE — ED Notes (Signed)
Bladder scan performed. 98 mL

## 2016-04-03 NOTE — ED Notes (Signed)
Pt tolerated ambulation well. Only complaint was pain in left hip, which he said began after waking up from a nap around 4:00 this afternoon.

## 2016-04-03 NOTE — ED Triage Notes (Signed)
Left lower back pain onset tonight

## 2016-04-03 NOTE — Discharge Instructions (Signed)
Follow up with your doctor for an MRI. Return to the ED if you develop new or worsening symptoms.

## 2016-04-03 NOTE — ED Provider Notes (Signed)
[facility] DEPT Provider Note   CSN: [id number] Arrival date & time: 04/03/16  4627  First Provider Contact:  First MD Initiated Contact with Patient 04/03/16 0153        History   Chief Complaint Chief Complaint  Patient presents with  . Back Pain    HPI Randy Hebert is a 50 y.o. male.  Patient with left-sided back and hip pain onset yesterday. Denies any falls or trauma. States he's had issues with his right hip in the past but never with his left. Pain is in his left hip and radiates down his left leg to his knee. Denies any weakness, numbness or tingling. Denies any bowel or bladder incontinence. Denies any fever or vomiting. Denies any chest pain or shortness of breath. No history of cancer or IV drug abuse. States she's been taking ibuprofen without relief. Denies any dysuria hematuria. Denies any testicular pain. Denies any fever or chills. History of CAD with stents. No current chest pain.   The history is provided by the patient.  Back Pain   Pertinent negatives include no chest pain, no fever, no headaches, no abdominal pain, no dysuria and no weakness.    Past Medical History:  Diagnosis Date  . Back pain   . CAD (coronary artery disease)    lateral STEMI 02/06/2015 00% D1 occlusion treated with Promus Premier 2.5 mm x 16 mm DES, 70% ramus stenosis, 40% mid RCA stenosis, 45% distal RCA stenosis, EF 45-50%  . Diabetes mellitus without complication ([place])   . MI (myocardial infarction) ([place])   . Neck pain   . Otitis media   . Pleurisy   . Sinus pause    9 sec sinus pause on telemetry after started on coreg after MI, avoid AV nodal blocking agent    Patient Active Problem List   Diagnosis Date Noted  . Right hip pain 12/19/2015  . Chest pain 12/10/2015  . Chronic obstructive pulmonary disease ([place]) 08/27/2015  . Cigarette nicotine dependence with nicotine-induced disorder 08/27/2015  . Hyperlipidemia 08/27/2015  . Obesity, unspecified 08/27/2015  .  Dyspnea 07/11/2015  . Acute respiratory failure with hypoxia ([place]) 06/02/2015  . Acute respiratory failure with hypoxemia ([place]) 06/02/2015  . Acute on chronic systolic heart failure ([place]) 06/02/2015  . ACE inhibitor-aggravated angioedema   . Chronic systolic CHF (congestive heart failure) ([place]) 06/01/2015  . CAD (coronary artery disease) 05/31/2015  . STEMI (ST elevation myocardial infarction) ([place]) 05/31/2015  . Tobacco use disorder 05/31/2015  . Angioedema 05/30/2015  . Sinus pause secondary to beta blocker 02/09/2015  . Essential hypertension 02/09/2015  . ST elevation myocardial infarction (STEMI) of lateral wall, initial episode of care [facility]) 02/06/2015    Class: Hospitalized for    Past Surgical History:  Procedure Laterality Date  . CARDIAC CATHETERIZATION N/A 02/06/2015   Procedure: Left Heart Cath and Coronary Angiography;  Surgeon: Leonie Man, MD;  Location: [place] CV LAB;  Service: Cardiovascular;  Laterality: N/A;  . CARDIAC CATHETERIZATION N/A 02/06/2015   Procedure: Coronary Stent Intervention;  Surgeon: Leonie Man, MD;  Location: [place];  Service: Cardiovascular;  Laterality: N/A;  . INCISION / DRAINAGE HAND / FINGER         Home Medications    Prior to Admission medications   Medication Sig Start Date End Date Taking? Authorizing Provider  albuterol (PROVENTIL HFA;VENTOLIN HFA) 108 (90 BASE) MCG/ACT inhaler Inhale 2 puffs into the lungs every 6 (six) hours as needed for wheezing  or shortness of breath. 07/18/15  Yes Soyla Dryer, PA-C  amLODipine (NORVASC) 5 MG tablet TAKE ONE TABLET BY MOUTH ONCE DAILY 12/05/15  Yes Soyla Dryer, PA-C  aspirin EC 81 MG tablet Take 81 mg by mouth daily.   Yes Historical Provider, MD  atorvastatin (LIPITOR) 20 MG tablet Take 20 mg by mouth daily. 09/27/15  Yes Historical Provider, MD  isosorbide mononitrate (IMDUR) 30 MG 24 hr tablet Take 0.5 tablets (15 mg total) by mouth daily. 08/03/15  Yes Arnoldo Lenis, MD  metFORMIN (GLUCOPHAGE) 500 MG tablet Take 1 tablet (500 mg total) by mouth 2 (two) times daily with a meal. 03/19/16  Yes Soyla Dryer, PA-C  mometasone-formoterol (DULERA) 100-5 MCG/ACT AERO Inhale 2 puffs into the lungs 2 (two) times daily. 07/18/15  Yes Soyla Dryer, PA-C  Multiple Vitamin (MULTIVITAMIN WITH MINERALS) TABS tablet Take 1 tablet by mouth daily.   Yes Historical Provider, MD  nitroGLYCERIN (NITROSTAT) 0.4 MG SL tablet Place 1 tablet (0.4 mg total) under the tongue every 5 (five) minutes as needed for chest pain. 02/09/15  Yes Almyra Deforest, PA  clopidogrel (PLAVIX) 75 MG tablet Take 75 mg by mouth daily.    Historical Provider, MD  ibuprofen (ADVIL,MOTRIN) 400 MG tablet Take 1 tablet (400 mg total) by mouth 3 (three) times daily. 04/03/16   Ezequiel Essex, MD  Menthol, Topical Analgesic, (BENGAY EX) Apply 1 application topically daily.    Historical Provider, MD    Family History Family History  Problem Relation Age of Onset  . Heart attack Father   . Stroke Father     Social History Social History  Substance Use Topics  . Smoking status: Current Some Day Smoker    Packs/day: 0.50    Years: 46.00    Types: Cigarettes    Start date: 06/07/1979  . Smokeless tobacco: Former Systems developer    Types: Chew  . Alcohol use No     Allergies   Lisinopril; Amoxicillin; and Carvedilol   Review of Systems Review of Systems  Constitutional: Negative for activity change, appetite change and fever.  HENT: Negative for congestion.   Eyes: Negative for visual disturbance.  Respiratory: Negative for cough, chest tightness and shortness of breath.   Cardiovascular: Negative for chest pain.  Gastrointestinal: Negative for abdominal pain, nausea and vomiting.  Genitourinary: Negative for dysuria, hematuria and testicular pain.  Musculoskeletal: Positive for back pain. Negative for arthralgias and myalgias.  Skin: Negative for rash.  Neurological: Negative for dizziness,  weakness and headaches.  Hematological: Negative for adenopathy.   A complete 10 system review of systems was obtained and all systems are negative except as noted in the HPI and PMH.    Physical Exam Updated Vital Signs BP 112/70 (BP Location: Left Arm)   Pulse 78   Temp 98 F (36.7 C) (Oral)   Resp 22   Ht '5\' 5"'$  (1.651 m)   Wt 230 lb (104.3 kg)   SpO2 91%   BMI 38.27 kg/m   Physical Exam  Constitutional: He is oriented to person, place, and time. He appears well-developed and well-nourished. No distress.  HENT:  Head: Normocephalic and atraumatic.  Mouth/Throat: Oropharynx is clear and moist. No oropharyngeal exudate.  Eyes: Conjunctivae and EOM are normal. Pupils are equal, round, and reactive to light.  Neck: Normal range of motion. Neck supple.  No meningismus.  Cardiovascular: Normal rate, regular rhythm, normal heart sounds and intact distal pulses.   No murmur heard. Pulmonary/Chest: Effort normal and  breath sounds normal. No respiratory distress.  Abdominal: Soft. There is no tenderness. There is no rebound and no guarding.  obese  Musculoskeletal: Normal range of motion. He exhibits no edema or tenderness.  TTP L lateral hip 5/5 strength in bilateral lower extremities. Ankle plantar and dorsiflexion intact. Great toe extension intact bilaterally. +2 DP and PT pulses. +2 patellar reflexes bilaterally. Normal gait.   Neurological: He is alert and oriented to person, place, and time. No cranial nerve deficit. He exhibits normal muscle tone. Coordination normal.  No ataxia on finger to nose bilaterally. No pronator drift. 5/5 strength throughout. CN 2-12 intact.Equal grip strength. Sensation intact.   Skin: Skin is warm.  Psychiatric: He has a normal mood and affect. His behavior is normal.  Nursing note and vitals reviewed.    ED Treatments / Results  Labs (all labs ordered are listed, but only abnormal results are displayed) Labs Reviewed  URINALYSIS, ROUTINE W  REFLEX MICROSCOPIC (NOT AT [facility]) - Abnormal; Notable for the following:       Result Value   Color, Urine AMBER (*)    Specific Gravity, Urine >1.030 (*)    Hgb urine dipstick TRACE (*)    Protein, ur TRACE (*)    Leukocytes, UA SMALL (*)    All other components within normal limits  URINE MICROSCOPIC-ADD ON - Abnormal; Notable for the following:    Squamous Epithelial / LPF 0-5 (*)    Bacteria, UA MANY (*)    Crystals CA OXALATE CRYSTALS (*)    All other components within normal limits    EKG  EKG Interpretation None       Radiology Dg Lumbar Spine Complete  Result Date: 04/03/2016 CLINICAL DATA:  Acute onset LEFT back pain. EXAM: LUMBAR SPINE - COMPLETE 4+ VIEW COMPARISON:  None. FINDINGS: Lumbar vertebral bodies appear intact. Grade 1 L4-5 anterolisthesis. No pars interarticularis defects. Moderate lower lumbar facet arthropathy. Multilevel moderate disc height loss, endplate sclerosis and marginal spurring compatible with degenerative discs. No destructive bony lesions. Moderate vascular calcifications. Calcifications projecting in central pelvis are likely prostatic. IMPRESSION: No acute fracture deformity. Grade 1 L4-5 anterolisthesis without spondylolysis. Moderate degenerative change of the lumbar spine. Electronically Signed   By: Elon Alas M.D.   On: 04/03/2016 02:36   Dg Hip Unilat W Or Wo Pelvis 2-3 Views Left  Result Date: 04/03/2016 CLINICAL DATA:  50 year old male with left hip pain EXAM: DG HIP (WITH OR WITHOUT PELVIS) 2-3V LEFT COMPARISON:  None. FINDINGS: Evaluation is limited due to soft tissue attenuation and body habitus. There is no acute fracture or dislocation. There is osteoarthritic changes of the hips. IMPRESSION: No acute fracture or dislocation. Electronically Signed   By: Anner Crete M.D.   On: 04/03/2016 02:33    Procedures Procedures (including critical care time)  Medications Ordered in ED Medications  HYDROcodone-acetaminophen  (NORCO/VICODIN) 5-325 MG per tablet 1 tablet (1 tablet Oral Given 04/03/16 0234)  ipratropium-albuterol (DUONEB) 0.5-2.5 (3) MG/3ML nebulizer solution 3 mL (3 mLs Nebulization Given 04/03/16 0424)     Initial Impression / Assessment and Plan / ED Course  I have reviewed the triage vital signs and the nursing notes.  Pertinent labs & imaging results that were available during my care of the patient were reviewed by me and considered in my medical decision making (see chart for details).  Clinical Course   Left hip and back pain without evidence of neurological deficit. Intact distal pulses. Low suspicion for cord compression or  cauda equina.  X-rays negative. Urinalysis negative. Patient given nebulizer given his wheezing on exam.  Suspect sciatica as cause of symptoms. Doubt cauda equina or cord compression. Patient is tolerating by mouth and ambulatory. Postvoid residual showed 90 mL of urine.  We'll treat with anti-inflammatories. No steroids given his history of diabetes. Follow-up with PCP. Return precautions discussed. He is able to ambulate.  Final Clinical Impressions(s) / ED Diagnoses   Final diagnoses:  Left low back pain, with sciatica presence unspecified    New Prescriptions Discharge Medication List as of 04/03/2016  4:48 AM       Ezequiel Essex, MD 04/03/16 [phone]

## 2016-04-20 ENCOUNTER — Emergency Department (HOSPITAL_COMMUNITY)
Admission: EM | Admit: 2016-04-20 | Discharge: 2016-04-20 | Disposition: A | Discharge status: Home / Self Care | Payer: Medicaid Other | Attending: Emergency Medicine | Admitting: Emergency Medicine

## 2016-04-20 ENCOUNTER — Emergency Department (HOSPITAL_COMMUNITY): Payer: Medicaid Other

## 2016-04-20 ENCOUNTER — Encounter (HOSPITAL_COMMUNITY): Payer: Self-pay | Admitting: Emergency Medicine

## 2016-04-20 DIAGNOSIS — E119 Type 2 diabetes mellitus without complications: Secondary | ICD-10-CM | POA: Diagnosis not present

## 2016-04-20 DIAGNOSIS — Z79899 Other long term (current) drug therapy: Secondary | ICD-10-CM | POA: Insufficient documentation

## 2016-04-20 DIAGNOSIS — Z7982 Long term (current) use of aspirin: Secondary | ICD-10-CM | POA: Insufficient documentation

## 2016-04-20 DIAGNOSIS — Z7984 Long term (current) use of oral hypoglycemic drugs: Secondary | ICD-10-CM | POA: Insufficient documentation

## 2016-04-20 DIAGNOSIS — I2510 Atherosclerotic heart disease of native coronary artery without angina pectoris: Secondary | ICD-10-CM | POA: Diagnosis not present

## 2016-04-20 DIAGNOSIS — I5022 Chronic systolic (congestive) heart failure: Secondary | ICD-10-CM | POA: Insufficient documentation

## 2016-04-20 DIAGNOSIS — I110 Hypertensive heart disease with heart failure: Secondary | ICD-10-CM | POA: Insufficient documentation

## 2016-04-20 DIAGNOSIS — K529 Noninfective gastroenteritis and colitis, unspecified: Secondary | ICD-10-CM | POA: Diagnosis not present

## 2016-04-20 DIAGNOSIS — F17210 Nicotine dependence, cigarettes, uncomplicated: Secondary | ICD-10-CM | POA: Insufficient documentation

## 2016-04-20 DIAGNOSIS — I252 Old myocardial infarction: Secondary | ICD-10-CM | POA: Insufficient documentation

## 2016-04-20 DIAGNOSIS — R1032 Left lower quadrant pain: Secondary | ICD-10-CM | POA: Diagnosis present

## 2016-04-20 HISTORY — DX: Pain in unspecified hip: M25.559

## 2016-04-20 LAB — I-STAT CHEM 8, ED
BUN: 6 mg/dL (ref 6–20)
CHLORIDE: 98 mmol/L — AB (ref 101–111)
CREATININE: 0.8 mg/dL (ref 0.61–1.24)
Calcium, Ion: 1.06 mmol/L — ABNORMAL LOW (ref 1.13–1.30)
GLUCOSE: 201 mg/dL — AB (ref 65–99)
HCT: 49 % (ref 39.0–52.0)
Hemoglobin: 16.7 g/dL (ref 13.0–17.0)
POTASSIUM: 3.6 mmol/L (ref 3.5–5.1)
Sodium: 140 mmol/L (ref 135–145)
TCO2: 26 mmol/L (ref 0–100)

## 2016-04-20 LAB — COMPREHENSIVE METABOLIC PANEL
ALT: 37 U/L (ref 17–63)
AST: 21 U/L (ref 15–41)
Albumin: 3.5 g/dL (ref 3.5–5.0)
Alkaline Phosphatase: 96 U/L (ref 38–126)
Anion gap: 6 (ref 5–15)
BUN: 8 mg/dL (ref 6–20)
CHLORIDE: 103 mmol/L (ref 101–111)
CO2: 26 mmol/L (ref 22–32)
CREATININE: 0.83 mg/dL (ref 0.61–1.24)
Calcium: 8.2 mg/dL — ABNORMAL LOW (ref 8.9–10.3)
GFR calc Af Amer: >60 mL/min (ref >60)
GFR calc non Af Amer: >60 mL/min (ref >60)
Glucose, Bld: 203 mg/dL — ABNORMAL HIGH (ref 65–99)
Potassium: 3.5 mmol/L (ref 3.5–5.1)
SODIUM: 135 mmol/L (ref 135–145)
Total Bilirubin: 0.9 mg/dL (ref 0.3–1.2)
Total Protein: 6.9 g/dL (ref 6.5–8.1)

## 2016-04-20 LAB — CBC WITH DIFFERENTIAL/PLATELET
BASOS ABS: 0 10*3/uL (ref 0.0–0.1)
Basophils Relative: 0 %
EOS ABS: 0.1 10*3/uL (ref 0.0–0.7)
EOS PCT: 1 %
HCT: 47.6 % (ref 39.0–52.0)
Hemoglobin: 16 g/dL (ref 13.0–17.0)
Lymphocytes Relative: 9 %
Lymphs Abs: 1.5 10*3/uL (ref 0.7–4.0)
MCH: 28.6 pg (ref 26.0–34.0)
MCHC: 33.6 g/dL (ref 30.0–36.0)
MCV: 85.2 fL (ref 78.0–100.0)
Monocytes Absolute: 1.2 10*3/uL — ABNORMAL HIGH (ref 0.1–1.0)
Monocytes Relative: 7 %
Neutro Abs: 13 10*3/uL — ABNORMAL HIGH (ref 1.7–7.7)
Neutrophils Relative %: 83 %
PLATELETS: 214 10*3/uL (ref 150–400)
RBC: 5.59 MIL/uL (ref 4.22–5.81)
RDW: 13.5 % (ref 11.5–15.5)
WBC: 15.7 10*3/uL — AB (ref 4.0–10.5)

## 2016-04-20 LAB — LIPASE, BLOOD: LIPASE: 19 U/L (ref 11–51)

## 2016-04-20 LAB — I-STAT CG4 LACTIC ACID, ED: Lactic Acid, Venous: 1.59 mmol/L (ref 0.5–1.9)

## 2016-04-20 MED ORDER — CIPROFLOXACIN IN D5W 400 MG/200ML IV SOLN
400.0000 mg | Freq: Once | INTRAVENOUS | Status: AC
  Administered 2016-04-20: 400 mg via INTRAVENOUS
  Filled 2016-04-20: qty 200

## 2016-04-20 MED ORDER — HYDROCODONE-ACETAMINOPHEN 5-325 MG PO TABS
1.0000 | ORAL_TABLET | Freq: Once | ORAL | Status: AC
  Administered 2016-04-20: 1 via ORAL
  Filled 2016-04-20: qty 1

## 2016-04-20 MED ORDER — METRONIDAZOLE 500 MG PO TABS: ORAL_TABLET | ORAL | 0 refills | Status: DC

## 2016-04-20 MED ORDER — CIPROFLOXACIN HCL 500 MG PO TABS: ORAL_TABLET | ORAL | 0 refills | Status: DC

## 2016-04-20 MED ORDER — METRONIDAZOLE IN NACL 5-0.79 MG/ML-% IV SOLN
500.0000 mg | Freq: Once | INTRAVENOUS | Status: AC
  Administered 2016-04-20: 500 mg via INTRAVENOUS
  Filled 2016-04-20: qty 100

## 2016-04-20 MED ORDER — SODIUM CHLORIDE 0.9 % IV BOLUS (SEPSIS)
2000.0000 mL | Freq: Once | INTRAVENOUS | Status: AC
  Administered 2016-04-20: 2000 mL via INTRAVENOUS

## 2016-04-20 MED ORDER — IOPAMIDOL (ISOVUE-300) INJECTION 61%
100.0000 mL | Freq: Once | INTRAVENOUS | Status: AC | PRN
  Administered 2016-04-20: 100 mL via INTRAVENOUS

## 2016-04-20 MED ORDER — ONDANSETRON 4 MG PO TBDP: ORAL_TABLET | ORAL | 0 refills | Status: DC

## 2016-04-20 NOTE — ED Notes (Signed)
MD at bedside. 

## 2016-04-20 NOTE — Discharge Instructions (Signed)
Drink plenty of fluids and follow-up with your doctor Thursday as planned. Return if getting worse

## 2016-04-20 NOTE — ED Provider Notes (Signed)
[facility] DEPT Provider Note   CSN: [id number] Arrival date & time: 04/20/16  1616     History   Chief Complaint Chief Complaint  Patient presents with  . Abdominal Pain    HPI Randy Hebert is a 50 y.o. male.  Patient complains of diarrhea and fever with some abdominal pain for 1 day   The history is provided by the patient. No language interpreter was used.  Abdominal Pain   This is a new problem. The current episode started yesterday. The problem occurs hourly. The problem has not changed since onset.Associated with: Nothing. The pain is located in the LLQ. The pain is at a severity of 4/10. The pain is mild. Associated symptoms include diarrhea and nausea. Pertinent negatives include anorexia, frequency, hematuria and headaches. Nothing aggravates the symptoms. Nothing relieves the symptoms.    Past Medical History:  Diagnosis Date  . Back pain   . CAD (coronary artery disease)    lateral STEMI 02/06/2015 00% D1 occlusion treated with Promus Premier 2.5 mm x 16 mm DES, 70% ramus stenosis, 40% mid RCA stenosis, 45% distal RCA stenosis, EF 45-50%  . Diabetes mellitus without complication ([place])   . Hip pain   . MI (myocardial infarction) ([place])   . Neck pain   . Otitis media   . Pleurisy   . Sinus pause    9 sec sinus pause on telemetry after started on coreg after MI, avoid AV nodal blocking agent    Patient Active Problem List   Diagnosis Date Noted  . Right hip pain 12/19/2015  . Chest pain 12/10/2015  . Chronic obstructive pulmonary disease ([place]) 08/27/2015  . Cigarette nicotine dependence with nicotine-induced disorder 08/27/2015  . Hyperlipidemia 08/27/2015  . Obesity, unspecified 08/27/2015  . Dyspnea 07/11/2015  . Acute respiratory failure with hypoxia ([place]) 06/02/2015  . Acute respiratory failure with hypoxemia ([place]) 06/02/2015  . Acute on chronic systolic heart failure ([place]) 06/02/2015  . ACE inhibitor-aggravated angioedema   . Chronic systolic  CHF (congestive heart failure) ([place]) 06/01/2015  . CAD (coronary artery disease) 05/31/2015  . STEMI (ST elevation myocardial infarction) ([place]) 05/31/2015  . Tobacco use disorder 05/31/2015  . Angioedema 05/30/2015  . Sinus pause secondary to beta blocker 02/09/2015  . Essential hypertension 02/09/2015  . ST elevation myocardial infarction (STEMI) of lateral wall, initial episode of care [facility]) 02/06/2015    Class: Hospitalized for    Past Surgical History:  Procedure Laterality Date  . CARDIAC CATHETERIZATION N/A 02/06/2015   Procedure: Left Heart Cath and Coronary Angiography;  Surgeon: Leonie Man, MD;  Location: [place] [facility] LAB;  Service: Cardiovascular;  Laterality: N/A;  . CARDIAC CATHETERIZATION N/A 02/06/2015   Procedure: Coronary Stent Intervention;  Surgeon: Leonie Man, MD;  Location: [place] [facility] LAB;  Service: Cardiovascular;  Laterality: N/A;  . CORONARY ANGIOPLASTY WITH STENT PLACEMENT    . INCISION / DRAINAGE HAND / FINGER         Home Medications    Prior to Admission medications   Medication Sig Start Date End Date Taking? Authorizing Provider  albuterol (PROVENTIL HFA;VENTOLIN HFA) 108 (90 BASE) MCG/ACT inhaler Inhale 2 puffs into the lungs every 6 (six) hours as needed for wheezing or shortness of breath. 07/18/15  Yes Soyla Dryer, PA-C  amLODipine (NORVASC) 5 MG tablet TAKE ONE TABLET BY MOUTH ONCE DAILY 12/05/15  Yes Soyla Dryer, PA-C  aspirin EC 81 MG tablet Take 81 mg by mouth daily.   Yes Historical  Provider, MD  atorvastatin (LIPITOR) 20 MG tablet Take 20 mg by mouth daily. 09/27/15  Yes Historical Provider, MD  ibuprofen (ADVIL,MOTRIN) 200 MG tablet Take 200 mg by mouth every 6 (six) hours as needed for mild pain or moderate pain.   Yes Historical Provider, MD  isosorbide mononitrate (IMDUR) 30 MG 24 hr tablet Take 0.5 tablets (15 mg total) by mouth daily. 08/03/15  Yes Arnoldo Lenis, MD  Menthol, Topical Analgesic, (BENGAY EX) Apply 1  application topically daily.   Yes Historical Provider, MD  metFORMIN (GLUCOPHAGE) 500 MG tablet Take 1 tablet (500 mg total) by mouth 2 (two) times daily with a meal. 03/19/16  Yes Soyla Dryer, PA-C  mometasone-formoterol (DULERA) 100-5 MCG/ACT AERO Inhale 2 puffs into the lungs 2 (two) times daily. 07/18/15  Yes Soyla Dryer, PA-C  Multiple Vitamin (MULTIVITAMIN WITH MINERALS) TABS tablet Take 1 tablet by mouth daily.   Yes Historical Provider, MD  nitroGLYCERIN (NITROSTAT) 0.4 MG SL tablet Place 1 tablet (0.4 mg total) under the tongue every 5 (five) minutes as needed for chest pain. 02/09/15  Yes Almyra Deforest, PA  ciprofloxacin (CIPRO) 500 MG tablet One po bid 04/20/16   Milton Ferguson, MD  ibuprofen (ADVIL,MOTRIN) 400 MG tablet Take 1 tablet (400 mg total) by mouth 3 (three) times daily. Patient not taking: Reported on 04/20/2016 04/03/16   Ezequiel Essex, MD  metroNIDAZOLE (FLAGYL) 500 MG tablet One po qid 04/20/16   Milton Ferguson, MD    Family History Family History  Problem Relation Age of Onset  . Heart attack Father   . Stroke Father     Social History Social History  Substance Use Topics  . Smoking status: Current Some Day Smoker    Packs/day: 0.50    Years: 46.00    Types: Cigarettes    Start date: 06/07/1979  . Smokeless tobacco: Former Systems developer    Types: Chew  . Alcohol use No     Allergies   Lisinopril; Amoxicillin; and Carvedilol   Review of Systems Review of Systems  Constitutional: Negative for appetite change and fatigue.  HENT: Negative for congestion, ear discharge and sinus pressure.   Eyes: Negative for discharge.  Respiratory: Negative for cough.   Cardiovascular: Negative for chest pain.  Gastrointestinal: Positive for abdominal pain, diarrhea and nausea. Negative for anorexia.  Genitourinary: Negative for frequency and hematuria.  Musculoskeletal: Negative for back pain.  Skin: Negative for rash.  Neurological: Negative for seizures and headaches.    Psychiatric/Behavioral: Negative for hallucinations.     Physical Exam Updated Vital Signs BP 134/69 (BP Location: Left Arm)   Pulse 94   Temp 100.9 F (38.3 C) (Oral)   Resp 17   Ht '5\' 6"'$  (1.676 m)   Wt 230 lb (104.3 kg)   SpO2 90%   BMI 37.12 kg/m   Physical Exam  Constitutional: He is oriented to person, place, and time. He appears well-developed.  HENT:  Head: Normocephalic.  Eyes: Conjunctivae and EOM are normal. No scleral icterus.  Neck: Neck supple. No thyromegaly present.  Cardiovascular: Normal rate and regular rhythm.  Exam reveals no gallop and no friction rub.   No murmur heard. Pulmonary/Chest: No stridor. He has no wheezes. He has no rales. He exhibits no tenderness.  Abdominal: He exhibits no distension. There is tenderness. There is no rebound.  Mild suprapubic and right lower quadrant tenderness  Musculoskeletal: Normal range of motion. He exhibits no edema.  Lymphadenopathy:    He has no cervical  adenopathy.  Neurological: He is oriented to person, place, and time. He exhibits normal muscle tone. Coordination normal.  Skin: No rash noted. No erythema.  Psychiatric: He has a normal mood and affect. His behavior is normal.     ED Treatments / Results  Labs (all labs ordered are listed, but only abnormal results are displayed) Labs Reviewed  CBC WITH DIFFERENTIAL/PLATELET - Abnormal; Notable for the following:       Result Value   WBC 15.7 (*)    Neutro Abs 13.0 (*)    Monocytes Absolute 1.2 (*)    All other components within normal limits  COMPREHENSIVE METABOLIC PANEL - Abnormal; Notable for the following:    Glucose, Bld 203 (*)    Calcium 8.2 (*)    All other components within normal limits  I-STAT CHEM 8, ED - Abnormal; Notable for the following:    Chloride 98 (*)    Glucose, Bld 201 (*)    Calcium, Ion 1.06 (*)    All other components within normal limits  LIPASE, BLOOD  I-STAT CG4 LACTIC ACID, ED    EKG  EKG  Interpretation  Date/Time:  Sunday April 20 2016 16:34:27 EDT Ventricular Rate:  105 PR Interval:    QRS Duration: 91 QT Interval:  351 QTC Calculation: 464 R Axis:   131 Text Interpretation:  Sinus tachycardia Lateral infarct, old Confirmed by Juan Olthoff  MD, Devaunte Gasparini [phone]) on 04/20/2016 9:36:13 PM       Radiology Ct Abdomen Pelvis W Contrast  Result Date: 04/20/2016 CLINICAL DATA:  50 year old male with left lower quadrant abdominal pain and diarrhea since 20/1 100 hours yesterday. Initial encounter. EXAM: CT ABDOMEN AND PELVIS WITH CONTRAST TECHNIQUE: Multidetector CT imaging of the abdomen and pelvis was performed using the standard protocol following bolus administration of intravenous contrast. CONTRAST:  133m ISOVUE-300 IOPAMIDOL (ISOVUE-300) INJECTION 61% COMPARISON:  Chest CTA 07/09/2015. FINDINGS: Stable mild left lower lobe dependent scarring or atelectasis. Otherwise negative lung bases. No pericardial or pleural effusion. Severe lower lumbar spine disc, endplate and facet degeneration. Severe right hip joint degeneration. No acute osseous abnormality identified. No pelvic free fluid. Diminutive urinary bladder. Incidental dystrophic calcifications of the prostate. A mixture of solid and liquid stool mildly distending the rectum which otherwise appears normal. The sigmoid colon is redundant with severe diverticulosis, but no areas of active inflammation identified. Beginning in the proximal descending colon and extending through to the hepatic flexure in ascending colon there is generalized colonic wall thickening. There is associated mesenteric stranding at the hepatic flexure. There is superimposed diverticulosis throughout the transverse colon. There is trace associated free fluid in the right pericolic gutter. The cecum and appendix appear to be spared, although there is a small volume of fluid surrounding the noninflamed appendix. The terminal ileum appears spared. Oral contrast has  just reached the distal small bowel. No dilated small bowel. Decompressed stomach and duodenum. There is a small distal duodenum diverticulum incidentally noted. No abdominal free air. Generalized hepatic steatosis. Mild sparing at the gallbladder fossa. Negative gallbladder, spleen, pancreas and adrenal glands. Portal venous system is patent. Aortoiliac calcified atherosclerosis noted. Major arterial structures are patent. Bilateral renal enhancement and renal contrast excretion is normal. No lymphadenopathy. IMPRESSION: 1. Generalized Acute Colitis from the ascending to the proximal descending colon. This might have begun as a diverticulitis at the hepatic flexure given widespread underlying diverticulosis, however, the sigmoid colon is spared despite advanced diverticular disease in that segment. 2. Small volume reactive appearing abdominal  free fluid in the right pericolic gutter. No free air, abscess, or lymphadenopathy. 3. Fatty liver disease.  Calcified aortic atherosclerosis. 4. Advanced right hip osteoarthritis. Advanced lower lumbar spine degeneration. Electronically Signed   By: Genevie Ann M.D.   On: 04/20/2016 19:34   Dg Chest Portable 1 View  Result Date: 04/20/2016 CLINICAL DATA:  Abdominal pain. EXAM: PORTABLE CHEST 1 VIEW COMPARISON:  December 10, 2015 FINDINGS: Minimal atelectasis in the left lung base. Mild cardiomegaly, stable. No other interval changes or acute abnormalities. IMPRESSION: Mild atelectasis in the left lung base. Electronically Signed   By: Dorise Bullion III M.D   On: 04/20/2016 17:10    Procedures Procedures (including critical care time)  Medications Ordered in ED Medications  ciprofloxacin (CIPRO) IVPB 400 mg (400 mg Intravenous New Bag/Given 04/20/16 2044)  metroNIDAZOLE (FLAGYL) IVPB 500 mg (500 mg Intravenous New Bag/Given 04/20/16 2042)  sodium chloride 0.9 % bolus 2,000 mL (0 mLs Intravenous Stopped 04/20/16 1827)  iopamidol (ISOVUE-300) 61 % injection 100 mL (100  mLs Intravenous Contrast Given 04/20/16 1909)  HYDROcodone-acetaminophen (NORCO/VICODIN) 5-325 MG per tablet 1 tablet (1 tablet Oral Given 04/20/16 2052)     Initial Impression / Assessment and Plan / ED Course  I have reviewed the triage vital signs and the nursing notes.  Pertinent labs & imaging results that were available during my care of the patient were reviewed by me and considered in my medical decision making (see chart for details).  Clinical Course    CT scan shows colitis possible diverticulitis. Lactic acid normal white count slightly elevated at 15,000. Patient prefers outpatient treatment. He'll be given Cipro and Flagyl will follow-up with doctor in 3 days  Final Clinical Impressions(s) / ED Diagnoses   Final diagnoses:  Colitis    New Prescriptions New Prescriptions   CIPROFLOXACIN (CIPRO) 500 MG TABLET    One po bid   METRONIDAZOLE (FLAGYL) 500 MG TABLET    One po qid     Milton Ferguson, MD 04/20/16 2145

## 2016-04-20 NOTE — ED Triage Notes (Signed)
Patient c/o abdominal pain, LLQ, with diarrhea since last night around 2100. States two episodes of "shaking uncontrollably today. Arrives alert/oriented. States dry heaves.

## 2016-04-23 ENCOUNTER — Ambulatory Visit: Payer: Self-pay | Admitting: Physician Assistant

## 2016-04-24 ENCOUNTER — Encounter: Payer: Self-pay | Admitting: Physician Assistant

## 2016-04-24 ENCOUNTER — Ambulatory Visit: Payer: Medicaid Other | Admitting: Physician Assistant

## 2016-04-24 VITALS — BP 140/74 | HR 97 | Temp 98.1°F | Ht 65.0 in | Wt 232.0 lb

## 2016-04-24 DIAGNOSIS — F17219 Nicotine dependence, cigarettes, with unspecified nicotine-induced disorders: Secondary | ICD-10-CM

## 2016-04-24 DIAGNOSIS — K529 Noninfective gastroenteritis and colitis, unspecified: Secondary | ICD-10-CM

## 2016-04-24 DIAGNOSIS — E785 Hyperlipidemia, unspecified: Secondary | ICD-10-CM

## 2016-04-24 DIAGNOSIS — I10 Essential (primary) hypertension: Secondary | ICD-10-CM

## 2016-04-24 DIAGNOSIS — I5022 Chronic systolic (congestive) heart failure: Secondary | ICD-10-CM

## 2016-04-24 DIAGNOSIS — I2510 Atherosclerotic heart disease of native coronary artery without angina pectoris: Secondary | ICD-10-CM

## 2016-04-24 DIAGNOSIS — E119 Type 2 diabetes mellitus without complications: Secondary | ICD-10-CM

## 2016-04-24 DIAGNOSIS — J449 Chronic obstructive pulmonary disease, unspecified: Secondary | ICD-10-CM

## 2016-04-24 DIAGNOSIS — M25551 Pain in right hip: Secondary | ICD-10-CM

## 2016-04-24 DIAGNOSIS — E669 Obesity, unspecified: Secondary | ICD-10-CM

## 2016-04-24 MED ORDER — ALBUTEROL SULFATE HFA 108 (90 BASE) MCG/ACT IN AERS: 2.0000 | INHALATION_SPRAY | Freq: Four times a day (QID) | RESPIRATORY_TRACT | 4 refills | Status: DC | PRN

## 2016-04-24 MED ORDER — MOMETASONE FURO-FORMOTEROL FUM 100-5 MCG/ACT IN AERO: 2.0000 | INHALATION_SPRAY | Freq: Two times a day (BID) | RESPIRATORY_TRACT | 4 refills | Status: DC

## 2016-04-24 NOTE — Progress Notes (Signed)
BP 140/74 (BP Location: Left Arm, Patient Position: Sitting, Cuff Size: Normal)   Pulse 97   Temp 98.1 F (36.7 C)   Ht '5\' 5"'$  (1.651 m)   Wt 232 lb (105.2 kg)   SpO2 92%   BMI 38.61 kg/m    Subjective:    Patient ID: Randy Hebert, male    DOB: [birth year], 50 y.o.   MRN: [id number]  HPI: Randy Hebert is a 50 y.o. male presenting on 04/24/2016 for Diabetes and Follow-up (pt went to ER on Sat for blood in stool and was told it was colitis)   HPI Pt seen in ER on Sunday.  He says he is doing better but says stools a bit loose.  He is eating some but doesn't have a lot of appetite yet.   He is continuing with the antibiotics that he was given  Pt has appointment with dr [name] in October  Discussed orthopedics- he hasn't been seen yet at [facility].  Told him that now that he has medicaid, he can contact [facility] since dr Aline Brochure sent referral there (see phone note 01/03/16)  Pt went to DM education class.    Pt checks his bp at home and says it's usu in the 130's  Pt has eye appointment next week that he set up  Pt is doing fine with his new diabetes medications.  Relevant past medical, surgical, family and social history reviewed and updated as indicated. Interim medical history since our last visit reviewed. Allergies and medications reviewed and updated.   Current Outpatient Prescriptions:  .  albuterol (PROVENTIL HFA;VENTOLIN HFA) 108 (90 BASE) MCG/ACT inhaler, Inhale 2 puffs into the lungs every 6 (six) hours as needed for wheezing or shortness of breath., Disp: 3 Inhaler, Rfl: 1 .  amLODipine (NORVASC) 5 MG tablet, TAKE ONE TABLET BY MOUTH ONCE DAILY, Disp: 30 tablet, Rfl: 3 .  aspirin EC 81 MG tablet, Take 81 mg by mouth daily., Disp: , Rfl:  .  atorvastatin (LIPITOR) 20 MG tablet, Take 20 mg by mouth daily., Disp: , Rfl: 2 .  ciprofloxacin (CIPRO) 500 MG tablet, One po bid, Disp: 28 tablet, Rfl: 0 .  ibuprofen (ADVIL,MOTRIN) 400 MG tablet, Take 1 tablet (400 mg  total) by mouth 3 (three) times daily., Disp: 30 tablet, Rfl: 0 .  isosorbide mononitrate (IMDUR) 30 MG 24 hr tablet, Take 0.5 tablets (15 mg total) by mouth daily., Disp: 45 tablet, Rfl: 3 .  Menthol, Topical Analgesic, (BENGAY EX), Apply 1 application topically daily., Disp: , Rfl:  .  metFORMIN (GLUCOPHAGE) 500 MG tablet, Take 1 tablet (500 mg total) by mouth 2 (two) times daily with a meal., Disp: 180 tablet, Rfl: 1 .  metroNIDAZOLE (FLAGYL) 500 MG tablet, One po qid, Disp: 56 tablet, Rfl: 0 .  mometasone-formoterol (DULERA) 100-5 MCG/ACT AERO, Inhale 2 puffs into the lungs 2 (two) times daily., Disp: 3 Inhaler, Rfl: 1 .  nitroGLYCERIN (NITROSTAT) 0.4 MG SL tablet, Place 1 tablet (0.4 mg total) under the tongue every 5 (five) minutes as needed for chest pain., Disp: 25 tablet, Rfl: 3 .  ondansetron (ZOFRAN ODT) 4 MG disintegrating tablet, '4mg'$  ODT q4 hours prn nausea/vomit, Disp: 12 tablet, Rfl: 0  Review of Systems  Constitutional: Positive for appetite change. Negative for chills, diaphoresis, fatigue, fever and unexpected weight change.  HENT: Positive for dental problem and sneezing. Negative for congestion, drooling, ear pain, facial swelling, hearing loss, mouth sores, sore throat, trouble swallowing and voice  change.   Eyes: Positive for redness. Negative for pain, discharge, itching and visual disturbance.  Respiratory: Positive for cough and shortness of breath. Negative for choking and wheezing.   Cardiovascular: Negative for chest pain, palpitations and leg swelling.  Gastrointestinal: Positive for abdominal pain. Negative for blood in stool, constipation, diarrhea and vomiting.  Endocrine: Positive for heat intolerance. Negative for cold intolerance and polydipsia.  Genitourinary: Negative for decreased urine volume, dysuria and hematuria.  Musculoskeletal: Positive for back pain and gait problem. Negative for arthralgias.  Skin: Negative for rash.  Allergic/Immunologic: Negative  for environmental allergies.  Neurological: Negative for seizures, syncope, light-headedness and headaches.  Hematological: Negative for adenopathy.  Psychiatric/Behavioral: Positive for agitation. Negative for dysphoric mood and suicidal ideas. The patient is not nervous/anxious.     Per HPI unless specifically indicated above     Objective:    BP 140/74 (BP Location: Left Arm, Patient Position: Sitting, Cuff Size: Normal)   Pulse 97   Temp 98.1 F (36.7 C)   Ht '5\' 5"'$  (1.651 m)   Wt 232 lb (105.2 kg)   SpO2 92%   BMI 38.61 kg/m   Wt Readings from Last 3 Encounters:  04/24/16 232 lb (105.2 kg)  04/20/16 230 lb (104.3 kg)  04/03/16 230 lb (104.3 kg)    Physical Exam  Constitutional: He is oriented to person, place, and time. He appears well-developed and well-nourished.  HENT:  Head: Normocephalic and atraumatic.  Neck: Neck supple.  Cardiovascular: Normal rate and regular rhythm.   Pulmonary/Chest: Effort normal and breath sounds normal. He has no wheezes.  Abdominal: Soft. Bowel sounds are normal. There is no hepatosplenomegaly. There is no tenderness.  Musculoskeletal: He exhibits no edema.  Lymphadenopathy:    He has no cervical adenopathy.  Neurological: He is alert and oriented to person, place, and time.  Skin: Skin is warm and dry.  Psychiatric: He has a normal mood and affect. His behavior is normal.  Vitals reviewed.   Results for orders placed or performed during the hospital encounter of 04/20/16  CBC with Differential/Platelet  Result Value Ref Range   WBC 15.7 (H) 4.0 - 10.5 K/uL   RBC 5.59 4.22 - 5.81 MIL/uL   Hemoglobin 16.0 13.0 - 17.0 g/dL   HCT 47.6 39.0 - 52.0 %   MCV 85.2 78.0 - 100.0 fL   MCH 28.6 26.0 - 34.0 pg   MCHC 33.6 30.0 - 36.0 g/dL   RDW 13.5 11.5 - 15.5 %   Platelets 214 150 - 400 K/uL   Neutrophils Relative % 83 %   Neutro Abs 13.0 (H) 1.7 - 7.7 K/uL   Lymphocytes Relative 9 %   Lymphs Abs 1.5 0.7 - 4.0 K/uL   Monocytes  Relative 7 %   Monocytes Absolute 1.2 (H) 0.1 - 1.0 K/uL   Eosinophils Relative 1 %   Eosinophils Absolute 0.1 0.0 - 0.7 K/uL   Basophils Relative 0 %   Basophils Absolute 0.0 0.0 - 0.1 K/uL  Comprehensive metabolic panel  Result Value Ref Range   Sodium 135 135 - 145 mmol/L   Potassium 3.5 3.5 - 5.1 mmol/L   Chloride 103 101 - 111 mmol/L   CO2 26 22 - 32 mmol/L   Glucose, Bld 203 (H) 65 - 99 mg/dL   BUN 8 6 - 20 mg/dL   Creatinine, Ser 0.83 0.61 - 1.24 mg/dL   Calcium 8.2 (L) 8.9 - 10.3 mg/dL   Total Protein 6.9 6.5 - 8.1 g/dL  Albumin 3.5 3.5 - 5.0 g/dL   AST 21 15 - 41 U/L   ALT 37 17 - 63 U/L   Alkaline Phosphatase 96 38 - 126 U/L   Total Bilirubin 0.9 0.3 - 1.2 mg/dL   GFR calc non Af Amer >60 >60 mL/min   GFR calc Af Amer >60 >60 mL/min   Anion gap 6 5 - 15  Lipase, blood  Result Value Ref Range   Lipase 19 11 - 51 U/L  I-Stat CG4 Lactic Acid, ED  Result Value Ref Range   Lactic Acid, Venous 1.59 0.5 - 1.9 mmol/L  I-stat chem 8, ed  Result Value Ref Range   Sodium 140 135 - 145 mmol/L   Potassium 3.6 3.5 - 5.1 mmol/L   Chloride 98 (L) 101 - 111 mmol/L   BUN 6 6 - 20 mg/dL   Creatinine, Ser 0.80 0.61 - 1.24 mg/dL   Glucose, Bld 201 (H) 65 - 99 mg/dL   Calcium, Ion 1.06 (L) 1.13 - 1.30 mmol/L   TCO2 26 0 - 100 mmol/L   Hemoglobin 16.7 13.0 - 17.0 g/dL   HCT 49.0 39.0 - 52.0 %      Assessment & Plan:    Encounter Diagnoses  Name Primary?  . Type 2 diabetes mellitus without complication, without long-term current use of insulin ([place]) Yes  . Essential hypertension   . Coronary artery disease involving native coronary artery of native heart without angina pectoris   . Chronic systolic CHF (congestive heart failure) ([place])   . Chronic obstructive pulmonary disease, unspecified COPD type ([place])   . Cigarette nicotine dependence with nicotine-induced disorder   . Hyperlipidemia   . Right hip pain   . Obesity, unspecified   . Colitis     -No changes today-  continue current medications -pt to contact orthopedics for surgery as discussed above -cardiology as scheduled -counseled on smoking cessation -F/u 2 months.  RTO sooner prn

## 2016-04-24 NOTE — Patient Instructions (Signed)
[facility] [address] [place],  [address] Phone: [phone] Fax: [phone]

## 2016-04-28 ENCOUNTER — Other Ambulatory Visit: Payer: Self-pay | Admitting: Physician Assistant

## 2016-04-28 MED ORDER — ONDANSETRON 4 MG PO TBDP: ORAL_TABLET | ORAL | 0 refills | Status: DC

## 2016-05-07 ENCOUNTER — Telehealth: Payer: Self-pay | Admitting: Cardiology

## 2016-05-07 NOTE — Telephone Encounter (Signed)
Called pt. He confirmed he is not on Plavix, and is still taking aspirin daily. [facility] to [facility].

## 2016-05-07 NOTE — Telephone Encounter (Signed)
Will forward to Dr. [name] for instructions.

## 2016-05-07 NOTE — Telephone Encounter (Signed)
[place] [place] for dental surgery from our standpoint. Verify he is off plavix, should still be on aspirin. If they need him to hold aspirin for the procedure that's fine.   [name] MD

## 2016-05-20 ENCOUNTER — Other Ambulatory Visit: Payer: Self-pay | Admitting: Physician Assistant

## 2016-05-20 DIAGNOSIS — I10 Essential (primary) hypertension: Secondary | ICD-10-CM

## 2016-05-20 DIAGNOSIS — J449 Chronic obstructive pulmonary disease, unspecified: Secondary | ICD-10-CM

## 2016-05-20 DIAGNOSIS — E785 Hyperlipidemia, unspecified: Secondary | ICD-10-CM

## 2016-05-20 DIAGNOSIS — E119 Type 2 diabetes mellitus without complications: Secondary | ICD-10-CM

## 2016-05-20 DIAGNOSIS — I5022 Chronic systolic (congestive) heart failure: Secondary | ICD-10-CM

## 2016-05-21 LAB — COMPREHENSIVE METABOLIC PANEL
ALT: 31 U/L (ref 9–46)
AST: 20 U/L (ref 10–35)
Albumin: 3.8 g/dL (ref 3.6–5.1)
Alkaline Phosphatase: 106 U/L (ref 40–115)
BILIRUBIN TOTAL: 0.6 mg/dL (ref 0.2–1.2)
BUN: 9 mg/dL (ref 7–25)
CALCIUM: 9 mg/dL (ref 8.6–10.3)
CO2: 29 mmol/L (ref 20–31)
Chloride: 101 mmol/L (ref 98–110)
Creat: 0.71 mg/dL (ref 0.70–1.33)
GLUCOSE: 177 mg/dL — AB (ref 65–99)
Potassium: 4.5 mmol/L (ref 3.5–5.3)
SODIUM: 140 mmol/L (ref 135–146)
Total Protein: 6.6 g/dL (ref 6.1–8.1)

## 2016-05-21 LAB — CBC WITH DIFFERENTIAL/PLATELET
BASOS ABS: 0 {cells}/uL (ref 0–200)
Basophils Relative: 0 %
EOS ABS: 282 {cells}/uL (ref 15–500)
Eosinophils Relative: 3 %
HCT: 48.1 % (ref 38.5–50.0)
HEMOGLOBIN: 15.7 g/dL (ref 13.2–17.1)
Lymphocytes Relative: 17 %
Lymphs Abs: 1598 cells/uL (ref 850–3900)
MCH: 27.9 pg (ref 27.0–33.0)
MCHC: 32.6 g/dL (ref 32.0–36.0)
MCV: 85.4 fL (ref 80.0–100.0)
MONOS PCT: 7 %
MPV: 10.7 fL (ref 7.5–12.5)
Monocytes Absolute: 658 cells/uL (ref 200–950)
NEUTROS ABS: 6862 {cells}/uL (ref 1500–7800)
Neutrophils Relative %: 73 %
PLATELETS: 229 10*3/uL (ref 140–400)
RBC: 5.63 MIL/uL (ref 4.20–5.80)
RDW: 14.9 % (ref 11.0–15.0)
WBC: 9.4 10*3/uL (ref 3.8–10.8)

## 2016-05-21 LAB — LIPID PANEL
CHOL/HDL RATIO: 5.3 ratio — AB (ref <=5.0)
Cholesterol: 128 mg/dL (ref 125–200)
HDL: 24 mg/dL — ABNORMAL LOW (ref >=40)
LDL Cholesterol: 60 mg/dL (ref <130)
Triglycerides: 222 mg/dL — ABNORMAL HIGH (ref <150)
VLDL: 44 mg/dL — AB (ref <30)

## 2016-05-22 LAB — HEMOGLOBIN A1C
Hgb A1c MFr Bld: 8 % — ABNORMAL HIGH (ref <5.7)
MEAN PLASMA GLUCOSE: 183 mg/dL

## 2016-05-22 LAB — MICROALBUMIN, URINE: MICROALB UR: 1.2 mg/dL

## 2016-05-27 ENCOUNTER — Encounter: Payer: Self-pay | Admitting: Physician Assistant

## 2016-05-27 ENCOUNTER — Ambulatory Visit: Payer: Medicaid Other | Admitting: Physician Assistant

## 2016-05-27 VITALS — BP 158/80 | HR 105 | Temp 98.1°F | Ht 65.0 in | Wt 229.8 lb

## 2016-05-27 DIAGNOSIS — E118 Type 2 diabetes mellitus with unspecified complications: Secondary | ICD-10-CM

## 2016-05-27 DIAGNOSIS — E1165 Type 2 diabetes mellitus with hyperglycemia: Secondary | ICD-10-CM

## 2016-05-27 DIAGNOSIS — F419 Anxiety disorder, unspecified: Secondary | ICD-10-CM

## 2016-05-27 DIAGNOSIS — E669 Obesity, unspecified: Secondary | ICD-10-CM

## 2016-05-27 DIAGNOSIS — E785 Hyperlipidemia, unspecified: Secondary | ICD-10-CM

## 2016-05-27 DIAGNOSIS — J449 Chronic obstructive pulmonary disease, unspecified: Secondary | ICD-10-CM

## 2016-05-27 DIAGNOSIS — I10 Essential (primary) hypertension: Secondary | ICD-10-CM

## 2016-05-27 DIAGNOSIS — F17219 Nicotine dependence, cigarettes, with unspecified nicotine-induced disorders: Secondary | ICD-10-CM

## 2016-05-27 DIAGNOSIS — M25551 Pain in right hip: Secondary | ICD-10-CM

## 2016-05-27 DIAGNOSIS — IMO0002 Reserved for concepts with insufficient information to code with codable children: Secondary | ICD-10-CM

## 2016-05-27 MED ORDER — AMLODIPINE BESYLATE 10 MG PO TABS: 10.0000 mg | ORAL_TABLET | Freq: Every day | ORAL | 3 refills | Status: DC

## 2016-05-27 MED ORDER — ALBUTEROL SULFATE (2.5 MG/3ML) 0.083% IN NEBU: 2.5000 mg | INHALATION_SOLUTION | Freq: Four times a day (QID) | RESPIRATORY_TRACT | 1 refills | Status: DC | PRN

## 2016-05-27 MED ORDER — METFORMIN HCL 1000 MG PO TABS: 1000.0000 mg | ORAL_TABLET | Freq: Two times a day (BID) | ORAL | 3 refills | Status: DC

## 2016-05-27 NOTE — Patient Instructions (Signed)
Add fish oil 2 capsules daily. Continue lowfat diet.

## 2016-05-27 NOTE — Progress Notes (Signed)
BP (!) 158/80 (BP Location: Left Arm, Patient Position: Sitting, Cuff Size: Normal)   Pulse (!) 105   Temp 98.1 F (36.7 C)   Ht '5\' 5"'$  (1.651 m)   Wt 229 lb 12 oz (104.2 kg)   SpO2 94%   BMI 38.23 kg/m    Subjective:    Patient ID: Randy Hebert, male    DOB: 1965-11-02, 50 y.o.   MRN: [id number]  HPI: Randy Hebert is a 50 y.o. male presenting on 05/27/2016 for Diabetes   HPI  - has MEDICAID  Pt here for DM a month earlier b/c the orthopedist wants evaluation done prior to his next appointment there on 10/18. Pt has only been started on diabetes medications for 2 months.  He is planning to replace R hip.   Pt states stomach all better but he is having premonitions that it will get bad again.    Pt has not contacted MH but says he is having a lot of depression related to his daughter.    Pt's bp is still high.   Pt requests nebulizer machine- had a lot of sob and wheezing last week after cutting grass on riding mower.  Relevant past medical, surgical, family and social history reviewed and updated as indicated. Interim medical history since our last visit reviewed. Allergies and medications reviewed and updated.   Current Outpatient Prescriptions:  .  albuterol (PROVENTIL HFA;VENTOLIN HFA) 108 (90 Base) MCG/ACT inhaler, Inhale 2 puffs into the lungs every 6 (six) hours as needed for wheezing or shortness of breath., Disp: 1 Inhaler, Rfl: 4 .  amLODipine (NORVASC) 5 MG tablet, TAKE ONE TABLET BY MOUTH ONCE DAILY, Disp: 30 tablet, Rfl: 3 .  aspirin EC 81 MG tablet, Take 81 mg by mouth daily., Disp: , Rfl:  .  atorvastatin (LIPITOR) 20 MG tablet, Take 20 mg by mouth daily., Disp: , Rfl: 2 .  Carboxymeth-Glycerin-Polysorb (REFRESH OPTIVE ADVANCED) 0.5-1-0.5 % SOLN, Apply 1 drop to eye daily as needed., Disp: , Rfl:  .  cycloSPORINE (RESTASIS) 0.05 % ophthalmic emulsion, Place 1 drop into both eyes 2 (two) times daily., Disp: , Rfl:  .  ibuprofen (ADVIL,MOTRIN) 400 MG  tablet, Take 1 tablet (400 mg total) by mouth 3 (three) times daily., Disp: 30 tablet, Rfl: 0 .  isosorbide mononitrate (IMDUR) 30 MG 24 hr tablet, Take 0.5 tablets (15 mg total) by mouth daily., Disp: 45 tablet, Rfl: 3 .  Menthol, Topical Analgesic, (BENGAY EX), Apply 1 application topically daily., Disp: , Rfl:  .  metFORMIN (GLUCOPHAGE) 500 MG tablet, Take 1 tablet (500 mg total) by mouth 2 (two) times daily with a meal., Disp: 180 tablet, Rfl: 1 .  mometasone-formoterol (DULERA) 100-5 MCG/ACT AERO, Inhale 2 puffs into the lungs 2 (two) times daily., Disp: 1 Inhaler, Rfl: 4 .  nitroGLYCERIN (NITROSTAT) 0.4 MG SL tablet, Place 1 tablet (0.4 mg total) under the tongue every 5 (five) minutes as needed for chest pain., Disp: 25 tablet, Rfl: 3 .  Olopatadine HCl (PAZEO) 0.7 % SOLN, Apply 1 drop to eye daily., Disp: , Rfl:    Review of Systems  Constitutional: Positive for appetite change and diaphoresis. Negative for chills, fatigue, fever and unexpected weight change.  HENT: Positive for sneezing. Negative for congestion, drooling, ear pain, facial swelling, hearing loss, mouth sores, sore throat, trouble swallowing and voice change.   Eyes: Positive for itching. Negative for pain, discharge, redness and visual disturbance.  Respiratory: Positive for cough, shortness of  breath and wheezing. Negative for choking.   Cardiovascular: Positive for leg swelling. Negative for chest pain and palpitations.  Gastrointestinal: Negative for abdominal pain, blood in stool, constipation, diarrhea and vomiting.  Endocrine: Positive for heat intolerance. Negative for cold intolerance and polydipsia.  Genitourinary: Negative for decreased urine volume, dysuria and hematuria.  Musculoskeletal: Positive for arthralgias, back pain and gait problem.  Skin: Negative for rash.  Allergic/Immunologic: Negative for environmental allergies.  Neurological: Positive for headaches. Negative for seizures, syncope and  light-headedness.  Hematological: Negative for adenopathy.  Psychiatric/Behavioral: Positive for agitation and dysphoric mood. Negative for suicidal ideas. The patient is not nervous/anxious.     Per HPI unless specifically indicated above     Objective:    BP (!) 158/80 (BP Location: Left Arm, Patient Position: Sitting, Cuff Size: Normal)   Pulse (!) 105   Temp 98.1 F (36.7 C)   Ht '5\' 5"'$  (1.651 m)   Wt 229 lb 12 oz (104.2 kg)   SpO2 94%   BMI 38.23 kg/m   Wt Readings from Last 3 Encounters:  05/27/16 229 lb 12 oz (104.2 kg)  04/24/16 232 lb (105.2 kg)  04/20/16 230 lb (104.3 kg)    Physical Exam  Constitutional: He is oriented to person, place, and time. He appears well-developed and well-nourished.  HENT:  Head: Normocephalic and atraumatic.  Neck: Neck supple.  Cardiovascular: Normal rate and regular rhythm.   Pulmonary/Chest: Effort normal and breath sounds normal. He has no wheezes.  Abdominal: Soft. Bowel sounds are normal. There is no hepatosplenomegaly. There is no tenderness.  Musculoskeletal: He exhibits no edema.  Lymphadenopathy:    He has no cervical adenopathy.  Neurological: He is alert and oriented to person, place, and time.  Skin: Skin is warm and dry.  Psychiatric: He has a normal mood and affect. His behavior is normal.  Vitals reviewed.   Results for orders placed or performed in visit on 05/20/16  HgB A1c  Result Value Ref Range   Hgb A1c MFr Bld 8.0 (H) <5.7 %   Mean Plasma Glucose 183 mg/dL  Microalbumin, urine  Result Value Ref Range   Microalb, Ur 1.2 Not estab mg/dL  Comprehensive Metabolic Panel (CMET)  Result Value Ref Range   Sodium 140 135 - 146 mmol/L   Potassium 4.5 3.5 - 5.3 mmol/L   Chloride 101 98 - 110 mmol/L   CO2 29 20 - 31 mmol/L   Glucose, Bld 177 (H) 65 - 99 mg/dL   BUN 9 7 - 25 mg/dL   Creat 0.71 0.70 - 1.33 mg/dL   Total Bilirubin 0.6 0.2 - 1.2 mg/dL   Alkaline Phosphatase 106 40 - 115 U/L   AST 20 10 - 35 U/L    ALT 31 9 - 46 U/L   Total Protein 6.6 6.1 - 8.1 g/dL   Albumin 3.8 3.6 - 5.1 g/dL   Calcium 9.0 8.6 - 10.3 mg/dL  CBC w/Diff/Platelet  Result Value Ref Range   WBC 9.4 3.8 - 10.8 K/uL   RBC 5.63 4.20 - 5.80 MIL/uL   Hemoglobin 15.7 13.2 - 17.1 g/dL   HCT 48.1 38.5 - 50.0 %   MCV 85.4 80.0 - 100.0 fL   MCH 27.9 27.0 - 33.0 pg   MCHC 32.6 32.0 - 36.0 g/dL   RDW 14.9 11.0 - 15.0 %   Platelets 229 140 - 400 K/uL   MPV 10.7 7.5 - 12.5 fL   Neutro Abs 6,862 1,500 - 7,800 cells/uL  Lymphs Abs 1,598 850 - 3,900 cells/uL   Monocytes Absolute 658 200 - 950 cells/uL   Eosinophils Absolute 282 15 - 500 cells/uL   Basophils Absolute 0 0 - 200 cells/uL   Neutrophils Relative % 73 %   Lymphocytes Relative 17 %   Monocytes Relative 7 %   Eosinophils Relative 3 %   Basophils Relative 0 %   Smear Review Criteria for review not met   Lipid Profile  Result Value Ref Range   Cholesterol 128 125 - 200 mg/dL   Triglycerides 222 (H) <150 mg/dL   HDL 24 (L) >=40 mg/dL   Total CHOL/HDL Ratio 5.3 (H) <=5.0 Ratio   VLDL 44 (H) <30 mg/dL   LDL Cholesterol 60 <130 mg/dL      Assessment & Plan:    Encounter Diagnoses  Name Primary?  . Essential hypertension Yes  . Uncontrolled type 2 diabetes mellitus with complication, without long-term current use of insulin ([place])   . Chronic obstructive pulmonary disease, unspecified COPD type ([place])   . Cigarette nicotine dependence with nicotine-induced disorder   . Hyperlipidemia   . Obesity, unspecified   . Right hip pain   . Anxiety    Encounter Diagnoses  Name Primary?  . Essential hypertension Yes  . Uncontrolled type 2 diabetes mellitus with complication, without long-term current use of insulin ([place])   . Chronic obstructive pulmonary disease, unspecified COPD type ([place])   . Cigarette nicotine dependence with nicotine-induced disorder   . Hyperlipidemia   . Obesity, unspecified   . Right hip pain   . Anxiety      -reviewed labs with  pt -Increase amlodipine to '10mg'$  daily.  Pt allergic to ACEI and had sinus pause on beta blocker so somewhat limited options.   -Add fish oil 2 daily. Counseled on lowfat diet -Increase metformin to 1000 mg bid. Counseled pt on diabetic diet and gave reading booklet on diabetic diet -Gave [facility] card and urged him to call for [place] issues -cardiology next week as scheduled -counseled pt on smoking cessation -gave rx for nebulizer machine -discussed with pt his bp is not good for upcoming surgery.  will F/u 2 wk to recheck bp  (spent 25 minutes with pt over half with couneling and education)

## 2016-05-28 ENCOUNTER — Other Ambulatory Visit: Payer: Self-pay | Admitting: Physician Assistant

## 2016-05-28 DIAGNOSIS — J449 Chronic obstructive pulmonary disease, unspecified: Secondary | ICD-10-CM

## 2016-06-04 ENCOUNTER — Ambulatory Visit (INDEPENDENT_AMBULATORY_CARE_PROVIDER_SITE_OTHER): Payer: Medicaid Other | Admitting: Cardiology

## 2016-06-04 ENCOUNTER — Encounter: Payer: Self-pay | Admitting: Cardiology

## 2016-06-04 VITALS — BP 120/68 | HR 91 | Ht 65.0 in | Wt 231.0 lb

## 2016-06-04 DIAGNOSIS — I2510 Atherosclerotic heart disease of native coronary artery without angina pectoris: Secondary | ICD-10-CM

## 2016-06-04 DIAGNOSIS — J441 Chronic obstructive pulmonary disease with (acute) exacerbation: Secondary | ICD-10-CM

## 2016-06-04 DIAGNOSIS — I10 Essential (primary) hypertension: Secondary | ICD-10-CM

## 2016-06-04 DIAGNOSIS — E782 Mixed hyperlipidemia: Secondary | ICD-10-CM

## 2016-06-04 MED ORDER — AZITHROMYCIN 250 MG PO TABS: ORAL_TABLET | ORAL | 0 refills | Status: DC

## 2016-06-04 MED ORDER — PREDNISONE 20 MG PO TABS: 40.0000 mg | ORAL_TABLET | Freq: Every day | ORAL | 0 refills | Status: DC

## 2016-06-04 NOTE — Progress Notes (Signed)
Clinical Summary Randy Hebert is a 50 y.o.male seen today for follow up of the following medical problems.   1. CAD - admit 01/2015 with lateral STEMI, received DES to D1. There was a 70% ramus and 40% mid RCA that was medically managed - 01/2015 echo LVEF 40%, lateral wall hypokinesis - no beta blocker due to history of sinus pause and bradycardia. No ACE-I given history of severe angioedema, no ARB due to risk of cross reactivity - admit 12/2015 with atypical chest pain, worst with palpatoin and movement. Had done some heavy lifting the day it started, negative workup for ACS.  Since that time no recurrent symptoms   - recent upper abdominal pain, diagnosed with colitis. No specific chest pain.  -compliant with meds   2. SOB/COPD - 07/2015 PFTs: mild to mod ventilatory defect with small airway obstruction.  - 07/2015 CT PE no PE, though suboptimal study - 07/2015 CXR no acute process.  PFTs with mild to mod vent defect, ABGs showed borderline resting hypoxemia.    -recent productive cough x 3 weeks. +wheezing. +nasal congestion.   3. Hyperlipidemia - he reports anxiety on crestor, he stopped taking.  - he has had troubles affording alternative statins. Currently on atorva '20mg'$  daily.  -last lipid panel  05/2016 TC 128 TG 222 HDL 24 LDL 60   4. Preoperative evaluation - he is being considered for right hip surgery    Past Medical History:  Diagnosis Date  . Back pain   . CAD (coronary artery disease)    lateral STEMI 02/06/2015 00% D1 occlusion treated with Promus Premier 2.5 mm x 16 mm DES, 70% ramus stenosis, 40% mid RCA stenosis, 45% distal RCA stenosis, EF 45-50%  . Diabetes mellitus without complication ([place])   . Hip pain   . MI (myocardial infarction)   . Neck pain   . Otitis media   . Pleurisy   . Sinus pause    9 sec sinus pause on telemetry after started on coreg after MI, avoid AV nodal blocking agent     Allergies  Allergen Reactions  .  Lisinopril Shortness Of Breath and Swelling    Angioedema, required intubation and mechanical ventilation  . Amoxicillin Nausea And Vomiting  . Carvedilol Other (See Comments)    Sinus pause on telemetry >3 seconds. Longest one 9 sec. No AV nodal agent     Current Outpatient Prescriptions  Medication Sig Dispense Refill  . albuterol (PROVENTIL HFA;VENTOLIN HFA) 108 (90 Base) MCG/ACT inhaler Inhale 2 puffs into the lungs every 6 (six) hours as needed for wheezing or shortness of breath. 1 Inhaler 4  . albuterol (PROVENTIL) (2.5 MG/3ML) 0.083% nebulizer solution Take 3 mLs (2.5 mg total) by nebulization every 6 (six) hours as needed for wheezing or shortness of breath. 150 mL 1  . amLODipine (NORVASC) 10 MG tablet Take 1 tablet (10 mg total) by mouth daily. 30 tablet 3  . aspirin EC 81 MG tablet Take 81 mg by mouth daily.    Marland Kitchen atorvastatin (LIPITOR) 20 MG tablet Take 20 mg by mouth daily.  2  . Carboxymeth-Glycerin-Polysorb (REFRESH OPTIVE ADVANCED) 0.5-1-0.5 % SOLN Apply 1 drop to eye daily as needed.    . cycloSPORINE (RESTASIS) 0.05 % ophthalmic emulsion Place 1 drop into both eyes 2 (two) times daily.    Marland Kitchen ibuprofen (ADVIL,MOTRIN) 400 MG tablet Take 1 tablet (400 mg total) by mouth 3 (three) times daily. 30 tablet 0  . isosorbide mononitrate (IMDUR)  30 MG 24 hr tablet Take 0.5 tablets (15 mg total) by mouth daily. 45 tablet 3  . Menthol, Topical Analgesic, (BENGAY EX) Apply 1 application topically daily.    . metFORMIN (GLUCOPHAGE) 1000 MG tablet Take 1 tablet (1,000 mg total) by mouth 2 (two) times daily with a meal. 60 tablet 3  . mometasone-formoterol (DULERA) 100-5 MCG/ACT AERO Inhale 2 puffs into the lungs 2 (two) times daily. 1 Inhaler 4  . nitroGLYCERIN (NITROSTAT) 0.4 MG SL tablet Place 1 tablet (0.4 mg total) under the tongue every 5 (five) minutes as needed for chest pain. 25 tablet 3  . Olopatadine HCl (PAZEO) 0.7 % SOLN Apply 1 drop to eye daily.     No current  facility-administered medications for this visit.      Past Surgical History:  Procedure Laterality Date  . CARDIAC CATHETERIZATION N/A 02/06/2015   Procedure: Left Heart Cath and Coronary Angiography;  Surgeon: Leonie Man, MD;  Location: [place] [facility];  Service: Cardiovascular;  Laterality: N/A;  . CARDIAC CATHETERIZATION N/A 02/06/2015   Procedure: Coronary Stent Intervention;  Surgeon: Leonie Man, MD;  Location: [place] [facility];  Service: Cardiovascular;  Laterality: N/A;  . CORONARY ANGIOPLASTY WITH STENT PLACEMENT    . INCISION / DRAINAGE HAND / FINGER       Allergies  Allergen Reactions  . Lisinopril Shortness Of Breath and Swelling    Angioedema, required intubation and mechanical ventilation  . Amoxicillin Nausea And Vomiting  . Carvedilol Other (See Comments)    Sinus pause on telemetry >3 seconds. Longest one 9 sec. No AV nodal agent      Family History  Problem Relation Age of Onset  . Heart attack Father   . Stroke Father      Social History [name] reports that he has been smoking Cigarettes.  He started smoking about 37 years ago. He has a 23.00 pack-year smoking history. He has quit using smokeless tobacco. His smokeless tobacco use included Chew. [name] reports that he does not drink alcohol.   Review of Systems CONSTITUTIONAL: No weight loss, fever, chills, weakness or fatigue.  HEENT: Eyes: No visual loss, blurred vision, double vision or yellow sclerae.No hearing loss, sneezing, congestion, runny nose or sore throat.  SKIN: No rash or itching.  CARDIOVASCULAR: per HPI RESPIRATORY: No shortness of breath, cough or sputum.  GASTROINTESTINAL: No anorexia, nausea, vomiting or diarrhea. No abdominal pain or blood.  GENITOURINARY: No burning on urination, no polyuria NEUROLOGICAL: No headache, dizziness, syncope, paralysis, ataxia, numbness or tingling in the extremities. No change in bowel or bladder control.  MUSCULOSKELETAL: hip  pain LYMPHATICS: No enlarged nodes. No history of splenectomy.  PSYCHIATRIC: No history of depression or anxiety.  ENDOCRINOLOGIC: No reports of sweating, cold or heat intolerance. No polyuria or polydipsia.  Marland Kitchen   Physical Examination Vitals:   06/04/16 1300  BP: 120/68  Pulse: 91   Vitals:   06/04/16 1300  Weight: 231 lb (104.8 kg)  Height: '5\' 5"'$  (1.651 m)    Gen: resting comfortably, no acute distress HEENT: no scleral icterus, pupils equal round and reactive, no palptable cervical adenopathy,  CV: RRR, no m/r/g, no jvd Resp: Clear to auscultation bilaterally GI: abdomen is soft, non-tender, non-distended, normal bowel sounds, no hepatosplenomegaly MSK: extremities are warm, no edema.  Skin: warm, no rash Neuro:  no focal deficits Psych: appropriate affect   Diagnostic Studies 01/2015 cath 1. 1st Diag lesion, 100% stenosed. A Promus Premier 2.5  mm x 16 mm drug-eluting stent was placed. There is a 0% residual stenosis post intervention. 2. Ramus lesion, 70% stenosed. 3. Mid RCA lesion, 40% stenosed. Dist RCA lesion, 45% stenosed. 4. Mild to moderately reduced LVEF with anterolateral and apical hypokinesis and elevated LVEDP  Abnormal anatomy with equal sized Diagonal and LAD Flower Franko but extensive RCA. Successful PCI of the First Diagonal Remell Giaimo.  Recommendations:  Standard post radial cath/PCI TR band removal  Dual independent therapy for minimum one year.  Check 2-D echocardiogram to better assess EF  Add statin and beta blocker. Smoking cessation counseling. Cardiac Rehabilitation And Case Management Consultation  If hemodynamic stable, would consider fast-track discharge  Would consider noninvasive evaluation of the Ramus Intermedius lesion in the absence of any recurrent anginal pain.  01/2015 echo Study Conclusions  - Left ventricle: Moderately severe hypokinesis of mid anterolateral segment, apical lateral segment, mid/apical anterior segments. EF  is 40%. The cavity size was normal. Wall thickness was increased in a pattern of mild LVH. - Aortic valve: Sclerosis without stenosis. There was no significant regurgitation. - Right ventricle: The cavity size was normal. Systolic function was normal.  07/2015 CT PE IMPRESSION: 1. Suboptimal contrast bolus timing in the pulmonary arterial tree, poor in the lobar vessels other than the right lower lobe. No central pulmonary embolus. If there is continued suspicion of acute PE nuclear medicine V/Q scan may be most helpful. 2. Situs abnormality in the chest with right side aortic arch and descending aorta. Mirror image branching. Other visible situs including cardiac and upper abdominal situs appear normal. 3. Calcified Coronary artery atherosclerosis. Hepatomegaly with fatty liver disease.  07/2015 PFTs: mild to mod ventilatory defect with small airway obstruction, moderately reduced DLCO.       Assessment and Plan  1. CAD - no current symptoms - continue current meds  2. COPD - symptoms consistent with exacerbation - we will give 5 days of prednisone '40mg'$  and azithromycin x 5 days.   3. Hyperlipideima - reports anxiety on crestor. Difficulty affording statins, currently on atorva '20mg'$  daily. - continue current meds  4. Preoperative evaluation - from cardiac standpoint ok to proceed with hip surgery  5. HTN - at goal, continue current meds.    F/u 6 months   Arnoldo Lenis, MD

## 2016-06-04 NOTE — Patient Instructions (Signed)
Medication Instructions:  Start prednisone 40 mg daily for 5 days  Start azithromycin 500 mg on day 1, then take 250 mg daily for 4 days   Labwork: none  Testing/Procedures: none  Follow-Up: Your physician wants you to follow-up in: 6 months  You will receive a reminder letter in the mail two months in advance. If you don't receive a letter, please call our office to schedule the follow-up appointment.   Any Other Special Instructions Will Be Listed Below (If Applicable).     If you need a refill on your cardiac medications before your next appointment, please call your pharmacy.

## 2016-06-05 ENCOUNTER — Other Ambulatory Visit: Payer: Self-pay | Admitting: *Deleted

## 2016-06-05 MED ORDER — PREDNISONE 20 MG PO TABS: 40.0000 mg | ORAL_TABLET | Freq: Every day | ORAL | 0 refills | Status: DC

## 2016-06-05 MED ORDER — AZITHROMYCIN 250 MG PO TABS: ORAL_TABLET | ORAL | 0 refills | Status: DC

## 2016-06-09 ENCOUNTER — Ambulatory Visit: Payer: Medicaid Other | Admitting: Physician Assistant

## 2016-06-09 ENCOUNTER — Encounter: Payer: Self-pay | Admitting: Physician Assistant

## 2016-06-09 VITALS — BP 136/74 | HR 99 | Temp 98.2°F | Ht 65.0 in | Wt 232.5 lb

## 2016-06-09 DIAGNOSIS — IMO0002 Reserved for concepts with insufficient information to code with codable children: Secondary | ICD-10-CM

## 2016-06-09 DIAGNOSIS — Z125 Encounter for screening for malignant neoplasm of prostate: Secondary | ICD-10-CM

## 2016-06-09 DIAGNOSIS — E118 Type 2 diabetes mellitus with unspecified complications: Secondary | ICD-10-CM

## 2016-06-09 DIAGNOSIS — J449 Chronic obstructive pulmonary disease, unspecified: Secondary | ICD-10-CM

## 2016-06-09 DIAGNOSIS — E785 Hyperlipidemia, unspecified: Secondary | ICD-10-CM

## 2016-06-09 DIAGNOSIS — E1165 Type 2 diabetes mellitus with hyperglycemia: Secondary | ICD-10-CM

## 2016-06-09 DIAGNOSIS — F17219 Nicotine dependence, cigarettes, with unspecified nicotine-induced disorders: Secondary | ICD-10-CM

## 2016-06-09 DIAGNOSIS — I10 Essential (primary) hypertension: Secondary | ICD-10-CM

## 2016-06-09 DIAGNOSIS — Z6838 Body mass index (BMI) 38.0-38.9, adult: Secondary | ICD-10-CM

## 2016-06-09 DIAGNOSIS — E669 Obesity, unspecified: Secondary | ICD-10-CM

## 2016-06-09 NOTE — Progress Notes (Signed)
BP 136/74 (BP Location: Left Arm, Patient Position: Sitting, Cuff Size: Normal)   Pulse 99   Temp 98.2 F (36.8 C)   Ht '5\' 5"'$  (1.651 m)   Wt 232 lb 8 oz (105.5 kg)   SpO2 94%   BMI 38.69 kg/m    Subjective:    Patient ID: Randy Hebert, male    DOB: 07-Sep-1965, 50 y.o.   MRN: [id number]  HPI: Randy Hebert is a 50 y.o. male presenting on 06/09/2016 for Hypertension   HPI   Pt is feeling well today.  He is still smoking  His pre-op appt is 10/18 with ortho  Pt has group meeting at [facility] today and appt with psychiatrist later this month  Relevant past medical, surgical, family and social history reviewed and updated as indicated. Interim medical history since our last visit reviewed. Allergies and medications reviewed and updated.   Current Outpatient Prescriptions:  .  albuterol (PROVENTIL HFA;VENTOLIN HFA) 108 (90 Base) MCG/ACT inhaler, Inhale 2 puffs into the lungs every 6 (six) hours as needed for wheezing or shortness of breath., Disp: 1 Inhaler, Rfl: 4 .  albuterol (PROVENTIL) (2.5 MG/3ML) 0.083% nebulizer solution, Take 3 mLs (2.5 mg total) by nebulization every 6 (six) hours as needed for wheezing or shortness of breath., Disp: 150 mL, Rfl: 1 .  amLODipine (NORVASC) 10 MG tablet, Take 1 tablet (10 mg total) by mouth daily., Disp: 30 tablet, Rfl: 3 .  aspirin EC 81 MG tablet, Take 81 mg by mouth daily., Disp: , Rfl:  .  atorvastatin (LIPITOR) 20 MG tablet, Take 20 mg by mouth daily., Disp: , Rfl: 2 .  Carboxymeth-Glycerin-Polysorb (REFRESH OPTIVE ADVANCED) 0.5-1-0.5 % SOLN, Apply 1 drop to eye daily as needed., Disp: , Rfl:  .  clindamycin (CLEOCIN) 300 MG capsule, Take 300 mg by mouth 3 (three) times daily., Disp: , Rfl:  .  cycloSPORINE (RESTASIS) 0.05 % ophthalmic emulsion, Place 1 drop into both eyes 2 (two) times daily., Disp: , Rfl:  .  ibuprofen (ADVIL,MOTRIN) 400 MG tablet, Take 1 tablet (400 mg total) by mouth 3 (three) times daily., Disp: 30 tablet,  Rfl: 0 .  isosorbide mononitrate (IMDUR) 30 MG 24 hr tablet, Take 0.5 tablets (15 mg total) by mouth daily., Disp: 45 tablet, Rfl: 3 .  Menthol, Topical Analgesic, (BENGAY EX), Apply 1 application topically daily., Disp: , Rfl:  .  metFORMIN (GLUCOPHAGE) 1000 MG tablet, Take 1 tablet (1,000 mg total) by mouth 2 (two) times daily with a meal., Disp: 60 tablet, Rfl: 3 .  mometasone-formoterol (DULERA) 100-5 MCG/ACT AERO, Inhale 2 puffs into the lungs 2 (two) times daily., Disp: 1 Inhaler, Rfl: 4 .  nitroGLYCERIN (NITROSTAT) 0.4 MG SL tablet, Place 1 tablet (0.4 mg total) under the tongue every 5 (five) minutes as needed for chest pain., Disp: 25 tablet, Rfl: 3 .  Olopatadine HCl (PAZEO) 0.7 % SOLN, Apply 1 drop to eye daily., Disp: , Rfl:  .  Omega-3 Fatty Acids (FISH OIL PO), Take 1,200 mg by mouth 2 (two) times daily., Disp: , Rfl:  .  predniSONE (DELTASONE) 20 MG tablet, Take 2 tablets (40 mg total) by mouth daily with breakfast., Disp: 10 tablet, Rfl: 0   Review of Systems  Constitutional: Positive for appetite change and fatigue. Negative for chills, diaphoresis, fever and unexpected weight change.  HENT: Positive for congestion, dental problem and sneezing. Negative for drooling, ear pain, facial swelling, hearing loss, mouth sores, sore throat, trouble swallowing and  voice change.   Eyes: Positive for discharge and itching. Negative for pain, redness and visual disturbance.  Respiratory: Positive for cough, shortness of breath and wheezing. Negative for choking.   Cardiovascular: Positive for leg swelling. Negative for chest pain and palpitations.  Gastrointestinal: Negative for abdominal pain, blood in stool, constipation, diarrhea and vomiting.  Endocrine: Positive for heat intolerance and polydipsia. Negative for cold intolerance.  Genitourinary: Negative for decreased urine volume, dysuria and hematuria.  Musculoskeletal: Positive for arthralgias, back pain and gait problem.  Skin:  Negative for rash.  Allergic/Immunologic: Negative for environmental allergies.  Neurological: Negative for seizures, syncope, light-headedness and headaches.  Hematological: Negative for adenopathy.  Psychiatric/Behavioral: Positive for agitation and dysphoric mood. Negative for suicidal ideas. The patient is nervous/anxious.     Per HPI unless specifically indicated above     Objective:    BP 136/74 (BP Location: Left Arm, Patient Position: Sitting, Cuff Size: Normal)   Pulse 99   Temp 98.2 F (36.8 C)   Ht '5\' 5"'$  (1.651 m)   Wt 232 lb 8 oz (105.5 kg)   SpO2 94%   BMI 38.69 kg/m   Wt Readings from Last 3 Encounters:  06/09/16 232 lb 8 oz (105.5 kg)  06/04/16 231 lb (104.8 kg)  05/27/16 229 lb 12 oz (104.2 kg)    Physical Exam  Constitutional: He is oriented to person, place, and time. He appears well-developed and well-nourished.  HENT:  Head: Normocephalic and atraumatic.  Neck: Neck supple.  Cardiovascular: Normal rate and regular rhythm.   Pulmonary/Chest: Effort normal and breath sounds normal. He has no wheezes.  Abdominal: Soft. Bowel sounds are normal. There is no hepatosplenomegaly. There is no tenderness.  Musculoskeletal: He exhibits no edema.  Lymphadenopathy:    He has no cervical adenopathy.  Neurological: He is alert and oriented to person, place, and time.  Skin: Skin is warm and dry.  Psychiatric: He has a normal mood and affect. His behavior is normal.  Vitals reviewed.       Assessment & Plan:    Encounter Diagnoses  Name Primary?  . Essential hypertension Yes  . Uncontrolled type 2 diabetes mellitus with complication, without long-term current use of insulin ([place])   . Hyperlipidemia, unspecified hyperlipidemia type   . Chronic obstructive pulmonary disease, unspecified COPD type ([place])   . Cigarette nicotine dependence with nicotine-induced disorder   . Class 2 obesity with body mass index (BMI) of 38.0 to 38.9 in adult, unspecified obesity  type, unspecified whether serious comorbidity present   . Screening for prostate cancer     -bp improved.  Continue current medications -okay for surgery -follow up 2 months.  RTO sooner prn

## 2016-06-10 ENCOUNTER — Ambulatory Visit: Payer: Medicaid Other | Admitting: Physician Assistant

## 2016-06-16 ENCOUNTER — Other Ambulatory Visit: Payer: Self-pay | Admitting: Physician Assistant

## 2016-06-18 ENCOUNTER — Ambulatory Visit (INDEPENDENT_AMBULATORY_CARE_PROVIDER_SITE_OTHER): Payer: Medicaid Other | Admitting: Orthopaedic Surgery

## 2016-06-18 DIAGNOSIS — M1611 Unilateral primary osteoarthritis, right hip: Secondary | ICD-10-CM

## 2016-06-18 DIAGNOSIS — M25551 Pain in right hip: Secondary | ICD-10-CM

## 2016-06-23 ENCOUNTER — Other Ambulatory Visit: Payer: Self-pay | Admitting: Orthopaedic Surgery

## 2016-06-24 ENCOUNTER — Encounter (HOSPITAL_COMMUNITY)
Admission: RE | Admit: 2016-06-24 | Discharge: 2016-06-24 | Disposition: A | Discharge status: Home / Self Care | Payer: Medicaid Other | Source: Ambulatory Visit | Attending: Orthopaedic Surgery | Admitting: Orthopaedic Surgery

## 2016-06-24 ENCOUNTER — Ambulatory Visit: Payer: Medicaid Other | Admitting: Physician Assistant

## 2016-06-24 ENCOUNTER — Encounter (HOSPITAL_COMMUNITY): Payer: Self-pay

## 2016-06-24 DIAGNOSIS — K219 Gastro-esophageal reflux disease without esophagitis: Secondary | ICD-10-CM | POA: Insufficient documentation

## 2016-06-24 DIAGNOSIS — E119 Type 2 diabetes mellitus without complications: Secondary | ICD-10-CM | POA: Diagnosis not present

## 2016-06-24 DIAGNOSIS — F17210 Nicotine dependence, cigarettes, uncomplicated: Secondary | ICD-10-CM | POA: Diagnosis not present

## 2016-06-24 DIAGNOSIS — I2510 Atherosclerotic heart disease of native coronary artery without angina pectoris: Secondary | ICD-10-CM | POA: Diagnosis not present

## 2016-06-24 DIAGNOSIS — I509 Heart failure, unspecified: Secondary | ICD-10-CM | POA: Diagnosis not present

## 2016-06-24 DIAGNOSIS — Z79899 Other long term (current) drug therapy: Secondary | ICD-10-CM | POA: Insufficient documentation

## 2016-06-24 DIAGNOSIS — Z01812 Encounter for preprocedural laboratory examination: Secondary | ICD-10-CM | POA: Diagnosis present

## 2016-06-24 DIAGNOSIS — Z7982 Long term (current) use of aspirin: Secondary | ICD-10-CM | POA: Diagnosis not present

## 2016-06-24 DIAGNOSIS — J449 Chronic obstructive pulmonary disease, unspecified: Secondary | ICD-10-CM | POA: Diagnosis not present

## 2016-06-24 DIAGNOSIS — M1611 Unilateral primary osteoarthritis, right hip: Secondary | ICD-10-CM | POA: Insufficient documentation

## 2016-06-24 HISTORY — DX: Other cervical disc displacement, unspecified cervical region: M50.20

## 2016-06-24 HISTORY — DX: Headache, unspecified: R51.9

## 2016-06-24 HISTORY — DX: Gastro-esophageal reflux disease without esophagitis: K21.9

## 2016-06-24 HISTORY — DX: Unspecified osteoarthritis, unspecified site: M19.90

## 2016-06-24 HISTORY — DX: Anxiety disorder, unspecified: F41.9

## 2016-06-24 HISTORY — DX: Heart failure, unspecified: I50.9

## 2016-06-24 HISTORY — DX: Dyspnea, unspecified: R06.00

## 2016-06-24 HISTORY — DX: Chronic obstructive pulmonary disease, unspecified: J44.9

## 2016-06-24 HISTORY — DX: Depression, unspecified: F32.A

## 2016-06-24 HISTORY — DX: Other cervical disc degeneration, unspecified cervical region: M50.30

## 2016-06-24 HISTORY — DX: Major depressive disorder, single episode, unspecified: F32.9

## 2016-06-24 HISTORY — DX: Headache: R51

## 2016-06-24 LAB — SURGICAL PCR SCREEN
MRSA, PCR: NEGATIVE
Staphylococcus aureus: NEGATIVE

## 2016-06-24 LAB — CBC
HCT: 48.6 % (ref 39.0–52.0)
Hemoglobin: 16.5 g/dL (ref 13.0–17.0)
MCH: 28.7 pg (ref 26.0–34.0)
MCHC: 34 g/dL (ref 30.0–36.0)
MCV: 84.5 fL (ref 78.0–100.0)
PLATELETS: 240 10*3/uL (ref 150–400)
RBC: 5.75 MIL/uL (ref 4.22–5.81)
RDW: 14.4 % (ref 11.5–15.5)
WBC: 9.2 10*3/uL (ref 4.0–10.5)

## 2016-06-24 LAB — GLUCOSE, CAPILLARY: Glucose-Capillary: 159 mg/dL — ABNORMAL HIGH (ref 65–99)

## 2016-06-24 LAB — BASIC METABOLIC PANEL
Anion gap: 8 (ref 5–15)
BUN: 7 mg/dL (ref 6–20)
CALCIUM: 9.5 mg/dL (ref 8.9–10.3)
CO2: 28 mmol/L (ref 22–32)
CREATININE: 0.76 mg/dL (ref 0.61–1.24)
Chloride: 103 mmol/L (ref 101–111)
GFR calc non Af Amer: >60 mL/min (ref >60)
GLUCOSE: 127 mg/dL — AB (ref 65–99)
Potassium: 3.9 mmol/L (ref 3.5–5.1)
Sodium: 139 mmol/L (ref 135–145)

## 2016-06-24 NOTE — Progress Notes (Signed)
   06/24/16 1236  OBSTRUCTIVE SLEEP APNEA  Have you ever been diagnosed with sleep apnea through a sleep study? No  Do you snore loudly (loud enough to be heard through closed doors)?  1  Do you often feel tired, fatigued, or sleepy during the daytime (such as falling asleep during driving or talking to someone)? 0  Has anyone observed you stop breathing during your sleep? 1  Do you have, or are you being treated for high blood pressure? 1  BMI more than 35 kg/m2? 1  Age > 50 (1-yes) 1  Neck circumference greater than:Male 16 inches or larger, Male 17inches or larger? 1  Male Gender (Yes=1) 1  Obstructive Sleep Apnea Score 7  Score 5 or greater  Results sent to PCP

## 2016-06-24 NOTE — Pre-Procedure Instructions (Signed)
Randy Hebert  06/24/2016      [facility], [place] - [address] [place] [address] Phone: [phone] Fax: [phone]  [id number] [address] [place], [address]: [phone]: [phone] [place]Phone: [phone]: [phone] [address]Phone: [phone]: [phone]   Your procedure is scheduled on Tues. Oct 31  [facility]  Call this number if you have problems the morning of surgery:  [phone]   Remember:  Do not eat food or drink liquids after midnight on Mon. Oct 30   Take these medicines the morning of surgery with A SIP OF WATER : amlodipine (norvasc), refresh eye drop if needed, citalopram (clexa), dulara inhaler-bring to hospital, restasis,isosorbide (imdur), nitroglycerine if needed, afrin nasal spray if needed, pazeo(olopatadine eye drop, albuterol inhaler if needed- bring to hospital, and albuterol nebulizer if needed              1 week prior to surgery stop: aspirin, aspirin containing products, advil, motrin , ibuprofen, aleve, BC Powders, Goody's, vitamins/herbal medicines, fish oil     How to Manage Your Diabetes Before and After Surgery  Why is it important to control my blood sugar before and after surgery? . Improving blood sugar levels before and after surgery helps healing and can limit problems. . A way of improving blood sugar control is eating a healthy diet by: o  Eating less sugar and carbohydrates o  Increasing activity/exercise o  Talking with your doctor about reaching your blood sugar goals . High blood sugars (greater than 180 mg/dL) can  raise your risk of infections and slow your recovery, so you will need to focus on controlling your diabetes during the weeks before surgery. . Make sure that the doctor who takes care of your diabetes knows about your planned surgery including the date and location.  How do I manage my blood sugar before surgery? . Check your blood sugar at least 4 times a day, starting 2 days before surgery, to make sure that the level is not too high or low. o Check your blood sugar the morning of your surgery when you wake up and every 2 hours until you get to the Short Stay unit. . If your blood sugar is less than 70 mg/dL, you will need to treat for low blood sugar: o Do not take insulin. o Treat a low blood sugar (less than 70 mg/dL) with  cup of clear juice (cranberry or apple), 4 glucose tablets, OR glucose gel. o Recheck blood sugar in 15 minutes after treatment (to make sure it is greater than 70 mg/dL). If your blood sugar is not greater than 70 mg/dL on recheck, call [phone] further instructions. . Report your blood sugar to the short stay nurse when you get to Short Stay.  . If you are admitted to the hospital after surgery: o Your blood sugar will be checked by the staff and you will probably be given insulin after surgery (instead of oral diabetes medicines) to make sure you have good blood sugar levels. o The goal for blood sugar control after surgery is 80-180  mg/dL.       WHAT DO I DO ABOUT MY DIABETES MEDICATION?   Marland Kitchen Do not take oral diabetes medicines (pills) the morning of surgery.    Do not wear jewelry.  Do not wear lotions, powders, or cologne, or deoderant.  Do not shave 48 hours prior to surgery.  Men may shave face and neck.  Do not bring valuables to the hospital.  [facility] is not responsible for any belongings or valuables.  Contacts, dentures or bridgework may not be worn into surgery.  Leave your suitcase in the car.  After surgery it may be brought to your  room.  For patients admitted to the hospital, discharge time will be determined by your treatment team.  Patients discharged the day of surgery will not be allowed to drive home.    Special instructions:  Review all handouts  Please read over the following fact sheets that you were given. Coughing and Deep Breathing and MRSA Information

## 2016-06-24 NOTE — Progress Notes (Signed)
PCP: Soyla Dryer @ [facility] in [place] Cardiologist: Dr. Carlyle Dolly  Fasting sugars 130  Hgb A1C 8.0 on 05-27-16

## 2016-06-25 NOTE — Progress Notes (Signed)
Anesthesia Chart Review:  Pt is a 50 year old male scheduled for R total hip arthroplasty anterior approach on 07/01/2016 with Jean Rosenthal, MD.   - Cardiologist is Carlyle Dolly, MD who cleared pt for surgery at last office visit 06/04/16.  - PCP is Soyla Dryer, PA who cleared pt for surgery at last office visit 06/09/16  PMH includes:  CAD (STEMI, DES to D1 02/06/15), sinus pause (avoid AV nodal blocking agents), CHF, DM, COPD, GERD. Current smoker. BMI 38  Medications include: Albuterol, amlodipine, ASA 81 mg, Lipitor, Imdur, metformin, dulera  Preoperative labs reviewed.  Glucose 127. HgbA1c was 8.0 on 05/20/16.   EKG 04/20/16: Sinus tachycardia (105 bpm). Lateral infarct, old  Echo 02/06/15:  - Left ventricle: Moderately severe hypokinesis of mid anterolateral segment, apical lateral segment, mid/apical anterior segments. EF is 40%. The cavity size was normal. Wall thickness was increased in a pattern of mild LVH. - Aortic valve: Sclerosis without stenosis. There was no significant regurgitation. - Right ventricle: The cavity size was normal. Systolic function was normal.  Cardiac cath 02/06/15:  1. 1st Diag lesion, 100% stenosed. A Promus Premier 2.5 mm x 16 mm drug-eluting stent was placed. There is a 0% residual stenosis post intervention. 2. Ramus lesion, 70% stenosed. 3. Mid RCA lesion, 40% stenosed. Dist RCA lesion, 45% stenosed. 4. Mild to moderately reduced LVEF with anterolateral and apical hypokinesis and elevated LVEDP   If no changes, I anticipate pt can proceed with surgery as scheduled.   Willeen Cass, FNP-BC [facility]/Anesthesiology Phone: [phone] 06/25/2016 3:17 PM

## 2016-06-30 MED ORDER — CLINDAMYCIN PHOSPHATE 900 MG/50ML IV SOLN
900.0000 mg | INTRAVENOUS | Status: AC
  Administered 2016-07-01: 900 mg via INTRAVENOUS
  Filled 2016-06-30: qty 50

## 2016-06-30 MED ORDER — TRANEXAMIC ACID 1000 MG/10ML IV SOLN
1000.0000 mg | INTRAVENOUS | Status: AC
  Administered 2016-07-01: 1000 mg via INTRAVENOUS
  Filled 2016-06-30: qty 10

## 2016-06-30 NOTE — Progress Notes (Signed)
Pt notified of new arrival time 1145.

## 2016-07-01 ENCOUNTER — Inpatient Hospital Stay (HOSPITAL_COMMUNITY): Payer: Medicaid Other | Admitting: Anesthesiology

## 2016-07-01 ENCOUNTER — Inpatient Hospital Stay (HOSPITAL_COMMUNITY): Payer: Medicaid Other | Admitting: Vascular Surgery

## 2016-07-01 ENCOUNTER — Inpatient Hospital Stay (HOSPITAL_COMMUNITY): Payer: Medicaid Other

## 2016-07-01 ENCOUNTER — Encounter (HOSPITAL_COMMUNITY): Payer: Self-pay | Admitting: Anesthesiology

## 2016-07-01 ENCOUNTER — Encounter (HOSPITAL_COMMUNITY)
Admission: RE | Disposition: A | Discharge status: Home Health | Payer: Self-pay | Source: Ambulatory Visit | Attending: Orthopaedic Surgery

## 2016-07-01 ENCOUNTER — Inpatient Hospital Stay (HOSPITAL_COMMUNITY)
Admission: RE | Admit: 2016-07-01 | Discharge: 2016-07-04 | DRG: 470 | Disposition: A | Discharge status: Home Health | Payer: Medicaid Other | Source: Ambulatory Visit | Attending: Orthopaedic Surgery | Admitting: Orthopaedic Surgery

## 2016-07-01 DIAGNOSIS — J449 Chronic obstructive pulmonary disease, unspecified: Secondary | ICD-10-CM | POA: Diagnosis present

## 2016-07-01 DIAGNOSIS — K219 Gastro-esophageal reflux disease without esophagitis: Secondary | ICD-10-CM | POA: Diagnosis present

## 2016-07-01 DIAGNOSIS — M1611 Unilateral primary osteoarthritis, right hip: Secondary | ICD-10-CM | POA: Diagnosis present

## 2016-07-01 DIAGNOSIS — E785 Hyperlipidemia, unspecified: Secondary | ICD-10-CM | POA: Diagnosis present

## 2016-07-01 DIAGNOSIS — F329 Major depressive disorder, single episode, unspecified: Secondary | ICD-10-CM | POA: Diagnosis present

## 2016-07-01 DIAGNOSIS — I252 Old myocardial infarction: Secondary | ICD-10-CM

## 2016-07-01 DIAGNOSIS — Z6838 Body mass index (BMI) 38.0-38.9, adult: Secondary | ICD-10-CM | POA: Diagnosis not present

## 2016-07-01 DIAGNOSIS — Z7984 Long term (current) use of oral hypoglycemic drugs: Secondary | ICD-10-CM | POA: Diagnosis not present

## 2016-07-01 DIAGNOSIS — I110 Hypertensive heart disease with heart failure: Secondary | ICD-10-CM | POA: Diagnosis present

## 2016-07-01 DIAGNOSIS — Z96641 Presence of right artificial hip joint: Secondary | ICD-10-CM

## 2016-07-01 DIAGNOSIS — Z794 Long term (current) use of insulin: Secondary | ICD-10-CM

## 2016-07-01 DIAGNOSIS — I5022 Chronic systolic (congestive) heart failure: Secondary | ICD-10-CM | POA: Diagnosis present

## 2016-07-01 DIAGNOSIS — F17210 Nicotine dependence, cigarettes, uncomplicated: Secondary | ICD-10-CM | POA: Diagnosis present

## 2016-07-01 DIAGNOSIS — E1165 Type 2 diabetes mellitus with hyperglycemia: Secondary | ICD-10-CM | POA: Diagnosis present

## 2016-07-01 DIAGNOSIS — Z23 Encounter for immunization: Secondary | ICD-10-CM | POA: Diagnosis not present

## 2016-07-01 DIAGNOSIS — Z7982 Long term (current) use of aspirin: Secondary | ICD-10-CM

## 2016-07-01 DIAGNOSIS — Z888 Allergy status to other drugs, medicaments and biological substances status: Secondary | ICD-10-CM

## 2016-07-01 DIAGNOSIS — E669 Obesity, unspecified: Secondary | ICD-10-CM | POA: Diagnosis present

## 2016-07-01 DIAGNOSIS — Z955 Presence of coronary angioplasty implant and graft: Secondary | ICD-10-CM | POA: Diagnosis not present

## 2016-07-01 DIAGNOSIS — Z79899 Other long term (current) drug therapy: Secondary | ICD-10-CM | POA: Diagnosis not present

## 2016-07-01 DIAGNOSIS — M25551 Pain in right hip: Secondary | ICD-10-CM | POA: Diagnosis present

## 2016-07-01 DIAGNOSIS — Z419 Encounter for procedure for purposes other than remedying health state, unspecified: Secondary | ICD-10-CM

## 2016-07-01 HISTORY — DX: Unilateral primary osteoarthritis, right hip: M16.11

## 2016-07-01 HISTORY — DX: Presence of right artificial hip joint: Z96.641

## 2016-07-01 HISTORY — PX: TOTAL HIP ARTHROPLASTY: SHX124

## 2016-07-01 LAB — BLOOD GAS, ARTERIAL
ACID-BASE EXCESS: 3.3 mmol/L — AB (ref 0.0–2.0)
BICARBONATE: 22.3 meq/L (ref 20.0–24.0)
Drawn by: 22223
FIO2: 60
O2 SAT: 93.4 %
PEEP/CPAP: 5 cmH2O
Patient temperature: 37
RATE: 14 resp/min
TCO2: 15.9 mmol/L (ref 0–100)
VT: 550 mL
pCO2 arterial: 27.7 mmHg — ABNORMAL LOW (ref 35.0–45.0)
pH, Arterial: 7.468 — ABNORMAL HIGH (ref 7.350–7.450)
pO2, Arterial: 67.3 mmHg — ABNORMAL LOW (ref 80.0–100.0)

## 2016-07-01 LAB — GLUCOSE, CAPILLARY
GLUCOSE-CAPILLARY: 148 mg/dL — AB (ref 65–99)
GLUCOSE-CAPILLARY: 119 mg/dL — AB (ref 65–99)
Glucose-Capillary: 188 mg/dL — ABNORMAL HIGH (ref 65–99)

## 2016-07-01 SURGERY — ARTHROPLASTY, HIP, TOTAL, ANTERIOR APPROACH
Anesthesia: General | Site: Hip | Laterality: Right

## 2016-07-01 MED ORDER — POLYVINYL ALCOHOL 1.4 % OP SOLN
1.0000 [drp] | OPHTHALMIC | Status: DC | PRN
  Filled 2016-07-01: qty 15

## 2016-07-01 MED ORDER — ROCURONIUM BROMIDE 100 MG/10ML IV SOLN
INTRAVENOUS | Status: DC | PRN
  Administered 2016-07-01: 20 mg via INTRAVENOUS
  Administered 2016-07-01: 40 mg via INTRAVENOUS

## 2016-07-01 MED ORDER — ONDANSETRON HCL 4 MG/2ML IJ SOLN
INTRAMUSCULAR | Status: DC | PRN
  Administered 2016-07-01: 4 mg via INTRAVENOUS

## 2016-07-01 MED ORDER — OLOPATADINE HCL 0.7 % OP SOLN: 1.0000 [drp] | Freq: Every day | OPHTHALMIC | Status: DC

## 2016-07-01 MED ORDER — OXYCODONE HCL 5 MG PO TABS: 5.0000 mg | ORAL_TABLET | Freq: Once | ORAL | Status: DC | PRN

## 2016-07-01 MED ORDER — OXYCODONE HCL 5 MG PO TABS
5.0000 mg | ORAL_TABLET | ORAL | Status: DC | PRN
  Administered 2016-07-01 – 2016-07-04 (×13): 10 mg via ORAL
  Filled 2016-07-01 (×12): qty 2

## 2016-07-01 MED ORDER — KETOROLAC TROMETHAMINE 15 MG/ML IJ SOLN
7.5000 mg | Freq: Four times a day (QID) | INTRAMUSCULAR | Status: AC
  Administered 2016-07-01 – 2016-07-02 (×3): 7.5 mg via INTRAVENOUS
  Filled 2016-07-01 (×2): qty 1

## 2016-07-01 MED ORDER — LIDOCAINE HCL (CARDIAC) 20 MG/ML IV SOLN
INTRAVENOUS | Status: DC | PRN
  Administered 2016-07-01: 100 mg via INTRATRACHEAL

## 2016-07-01 MED ORDER — MIDAZOLAM HCL 5 MG/5ML IJ SOLN
INTRAMUSCULAR | Status: DC | PRN
  Administered 2016-07-01: 2 mg via INTRAVENOUS

## 2016-07-01 MED ORDER — OLOPATADINE HCL 0.1 % OP SOLN
1.0000 [drp] | Freq: Two times a day (BID) | OPHTHALMIC | Status: DC
  Administered 2016-07-02 – 2016-07-04 (×5): 1 [drp] via OPHTHALMIC
  Filled 2016-07-01: qty 5

## 2016-07-01 MED ORDER — CYCLOSPORINE 0.05 % OP EMUL
1.0000 [drp] | Freq: Two times a day (BID) | OPHTHALMIC | Status: DC
  Administered 2016-07-01 – 2016-07-04 (×5): 1 [drp] via OPHTHALMIC
  Filled 2016-07-01 (×6): qty 1

## 2016-07-01 MED ORDER — SUGAMMADEX SODIUM 200 MG/2ML IV SOLN
INTRAVENOUS | Status: DC | PRN
  Administered 2016-07-01: 200 mg via INTRAVENOUS

## 2016-07-01 MED ORDER — ALUM & MAG HYDROXIDE-SIMETH 200-200-20 MG/5ML PO SUSP
30.0000 mL | ORAL | Status: DC | PRN
  Administered 2016-07-03: 30 mL via ORAL
  Filled 2016-07-01: qty 30

## 2016-07-01 MED ORDER — ACETAMINOPHEN 325 MG PO TABS
650.0000 mg | ORAL_TABLET | Freq: Four times a day (QID) | ORAL | Status: DC | PRN
  Administered 2016-07-03 – 2016-07-04 (×2): 650 mg via ORAL
  Filled 2016-07-01 (×2): qty 2

## 2016-07-01 MED ORDER — MOMETASONE FURO-FORMOTEROL FUM 100-5 MCG/ACT IN AERO
2.0000 | INHALATION_SPRAY | Freq: Two times a day (BID) | RESPIRATORY_TRACT | Status: DC
  Administered 2016-07-01 – 2016-07-03 (×5): 2 via RESPIRATORY_TRACT
  Filled 2016-07-01: qty 8.8

## 2016-07-01 MED ORDER — PROMETHAZINE HCL 25 MG/ML IJ SOLN
INTRAMUSCULAR | Status: AC
  Filled 2016-07-01: qty 1

## 2016-07-01 MED ORDER — OXYCODONE HCL 5 MG PO TABS
ORAL_TABLET | ORAL | Status: AC
  Filled 2016-07-01: qty 2

## 2016-07-01 MED ORDER — PHENYLEPHRINE HCL 10 MG/ML IJ SOLN
INTRAVENOUS | Status: DC | PRN
  Administered 2016-07-01: 10 ug/min via INTRAVENOUS

## 2016-07-01 MED ORDER — KETOROLAC TROMETHAMINE 15 MG/ML IJ SOLN
INTRAMUSCULAR | Status: AC
  Filled 2016-07-01: qty 1

## 2016-07-01 MED ORDER — MENTHOL 3 MG MT LOZG: 1.0000 | LOZENGE | OROMUCOSAL | Status: DC | PRN

## 2016-07-01 MED ORDER — LACTATED RINGERS IV SOLN
INTRAVENOUS | Status: DC
  Administered 2016-07-01 (×2): via INTRAVENOUS
  Administered 2016-07-01: 50 mL/h via INTRAVENOUS

## 2016-07-01 MED ORDER — ONDANSETRON HCL 4 MG/2ML IJ SOLN: 4.0000 mg | Freq: Four times a day (QID) | INTRAMUSCULAR | Status: DC | PRN

## 2016-07-01 MED ORDER — DOCUSATE SODIUM 100 MG PO CAPS
100.0000 mg | ORAL_CAPSULE | Freq: Two times a day (BID) | ORAL | Status: DC
  Administered 2016-07-01 – 2016-07-04 (×6): 100 mg via ORAL
  Filled 2016-07-01 (×6): qty 1

## 2016-07-01 MED ORDER — ACETAMINOPHEN 650 MG RE SUPP: 650.0000 mg | Freq: Four times a day (QID) | RECTAL | Status: DC | PRN

## 2016-07-01 MED ORDER — METOCLOPRAMIDE HCL 5 MG PO TABS
5.0000 mg | ORAL_TABLET | Freq: Three times a day (TID) | ORAL | Status: DC | PRN
  Administered 2016-07-03: 10 mg via ORAL
  Filled 2016-07-01: qty 2

## 2016-07-01 MED ORDER — HYDROMORPHONE HCL 1 MG/ML IJ SOLN
0.2500 mg | INTRAMUSCULAR | Status: DC | PRN
  Administered 2016-07-01: 0.5 mg via INTRAVENOUS

## 2016-07-01 MED ORDER — METFORMIN HCL 500 MG PO TABS
1000.0000 mg | ORAL_TABLET | Freq: Two times a day (BID) | ORAL | Status: DC
  Administered 2016-07-02 – 2016-07-04 (×5): 1000 mg via ORAL
  Filled 2016-07-01 (×5): qty 2

## 2016-07-01 MED ORDER — ISOSORBIDE MONONITRATE ER 30 MG PO TB24
15.0000 mg | ORAL_TABLET | Freq: Every day | ORAL | Status: DC
  Administered 2016-07-02 – 2016-07-04 (×3): 15 mg via ORAL
  Filled 2016-07-01 (×3): qty 1

## 2016-07-01 MED ORDER — ASPIRIN EC 325 MG PO TBEC
325.0000 mg | DELAYED_RELEASE_TABLET | Freq: Two times a day (BID) | ORAL | Status: DC
  Administered 2016-07-02 – 2016-07-04 (×5): 325 mg via ORAL
  Filled 2016-07-01 (×5): qty 1

## 2016-07-01 MED ORDER — VANCOMYCIN HCL IN DEXTROSE 1-5 GM/200ML-% IV SOLN
1000.0000 mg | Freq: Two times a day (BID) | INTRAVENOUS | Status: AC
  Administered 2016-07-01: 1000 mg via INTRAVENOUS
  Filled 2016-07-01: qty 200

## 2016-07-01 MED ORDER — HYDROMORPHONE HCL 2 MG/ML IJ SOLN
INTRAMUSCULAR | Status: AC
  Administered 2016-07-01: 0.5 mg
  Filled 2016-07-01: qty 1

## 2016-07-01 MED ORDER — CARBOXYMETH-GLYCERIN-POLYSORB 0.5-1-0.5 % OP SOLN: 1.0000 [drp] | Freq: Every day | OPHTHALMIC | Status: DC | PRN

## 2016-07-01 MED ORDER — CITALOPRAM HYDROBROMIDE 20 MG PO TABS
10.0000 mg | ORAL_TABLET | Freq: Every day | ORAL | Status: DC
  Administered 2016-07-02 – 2016-07-04 (×3): 10 mg via ORAL
  Filled 2016-07-01 (×3): qty 1

## 2016-07-01 MED ORDER — AMLODIPINE BESYLATE 10 MG PO TABS
10.0000 mg | ORAL_TABLET | Freq: Every day | ORAL | Status: DC
  Administered 2016-07-02 – 2016-07-04 (×3): 10 mg via ORAL
  Filled 2016-07-01 (×3): qty 1

## 2016-07-01 MED ORDER — ALBUTEROL SULFATE HFA 108 (90 BASE) MCG/ACT IN AERS: 2.0000 | INHALATION_SPRAY | Freq: Four times a day (QID) | RESPIRATORY_TRACT | Status: DC | PRN

## 2016-07-01 MED ORDER — DIPHENHYDRAMINE HCL 12.5 MG/5ML PO ELIX: 12.5000 mg | ORAL_SOLUTION | ORAL | Status: DC | PRN

## 2016-07-01 MED ORDER — FENTANYL CITRATE (PF) 100 MCG/2ML IJ SOLN
INTRAMUSCULAR | Status: DC | PRN
  Administered 2016-07-01: 100 ug via INTRAVENOUS
  Administered 2016-07-01 (×2): 50 ug via INTRAVENOUS

## 2016-07-01 MED ORDER — METOCLOPRAMIDE HCL 5 MG/ML IJ SOLN: 5.0000 mg | Freq: Three times a day (TID) | INTRAMUSCULAR | Status: DC | PRN

## 2016-07-01 MED ORDER — FENTANYL CITRATE (PF) 100 MCG/2ML IJ SOLN
INTRAMUSCULAR | Status: AC
  Filled 2016-07-01: qty 2

## 2016-07-01 MED ORDER — PROMETHAZINE HCL 25 MG/ML IJ SOLN
6.2500 mg | INTRAMUSCULAR | Status: DC | PRN
  Administered 2016-07-01: 6.25 mg via INTRAVENOUS

## 2016-07-01 MED ORDER — SUGAMMADEX SODIUM 200 MG/2ML IV SOLN
INTRAVENOUS | Status: AC
  Filled 2016-07-01: qty 2

## 2016-07-01 MED ORDER — OXYCODONE HCL 5 MG/5ML PO SOLN: 5.0000 mg | Freq: Once | ORAL | Status: DC | PRN

## 2016-07-01 MED ORDER — SODIUM CHLORIDE 0.9 % IV SOLN
INTRAVENOUS | Status: DC
  Administered 2016-07-01 – 2016-07-02 (×2): via INTRAVENOUS

## 2016-07-01 MED ORDER — MIDAZOLAM HCL 2 MG/2ML IJ SOLN
INTRAMUSCULAR | Status: AC
  Filled 2016-07-01: qty 2

## 2016-07-01 MED ORDER — ALBUTEROL SULFATE (2.5 MG/3ML) 0.083% IN NEBU: 2.5000 mg | INHALATION_SOLUTION | Freq: Four times a day (QID) | RESPIRATORY_TRACT | Status: DC | PRN

## 2016-07-01 MED ORDER — SODIUM CHLORIDE 0.9 % IR SOLN
Status: DC | PRN
  Administered 2016-07-01: 3000 mL

## 2016-07-01 MED ORDER — ATORVASTATIN CALCIUM 20 MG PO TABS
20.0000 mg | ORAL_TABLET | Freq: Every day | ORAL | Status: DC
  Administered 2016-07-01 – 2016-07-03 (×3): 20 mg via ORAL
  Filled 2016-07-01 (×3): qty 1

## 2016-07-01 MED ORDER — PROPOFOL 10 MG/ML IV BOLUS
INTRAVENOUS | Status: DC | PRN
  Administered 2016-07-01: 160 mg via INTRAVENOUS

## 2016-07-01 MED ORDER — SUCCINYLCHOLINE CHLORIDE 20 MG/ML IJ SOLN
INTRAMUSCULAR | Status: DC | PRN
  Administered 2016-07-01: 140 mg via INTRAVENOUS

## 2016-07-01 MED ORDER — PHENOL 1.4 % MT LIQD: 1.0000 | OROMUCOSAL | Status: DC | PRN

## 2016-07-01 MED ORDER — HYDROMORPHONE HCL 2 MG/ML IJ SOLN
1.0000 mg | INTRAMUSCULAR | Status: DC | PRN
  Administered 2016-07-02 – 2016-07-04 (×5): 1 mg via INTRAVENOUS
  Filled 2016-07-01 (×5): qty 1

## 2016-07-01 MED ORDER — ONDANSETRON HCL 4 MG PO TABS: 4.0000 mg | ORAL_TABLET | Freq: Four times a day (QID) | ORAL | Status: DC | PRN

## 2016-07-01 MED ORDER — 0.9 % SODIUM CHLORIDE (POUR BTL) OPTIME
TOPICAL | Status: DC | PRN
  Administered 2016-07-01: 1000 mL

## 2016-07-01 SURGICAL SUPPLY — 55 items
APL SKNCLS STERI-STRIP NONHPOA (GAUZE/BANDAGES/DRESSINGS) ×1
BENZOIN TINCTURE PRP APPL 2/3 (GAUZE/BANDAGES/DRESSINGS) ×3 IMPLANT
BLADE SAW SGTL 18X1.27X75 (BLADE) ×2 IMPLANT
BLADE SAW SGTL 18X1.27X75MM (BLADE) ×1
BLADE SURG ROTATE 9660 (MISCELLANEOUS) IMPLANT
CAPT HIP TOTAL 2 ×2 IMPLANT
CELLS DAT CNTRL 66122 CELL SVR (MISCELLANEOUS) ×1 IMPLANT
CLOSURE WOUND 1/2 X4 (GAUZE/BANDAGES/DRESSINGS) ×2
COVER SURGICAL LIGHT HANDLE (MISCELLANEOUS) ×3 IMPLANT
DRAPE C-ARM 42X72 X-RAY (DRAPES) ×3 IMPLANT
DRAPE STERI IOBAN 125X83 (DRAPES) ×3 IMPLANT
DRAPE U-SHAPE 47X51 STRL (DRAPES) ×9 IMPLANT
DRSG AQUACEL AG ADV 3.5X10 (GAUZE/BANDAGES/DRESSINGS) ×3 IMPLANT
DURAPREP 26ML APPLICATOR (WOUND CARE) ×3 IMPLANT
ELECT BLADE 4.0 EZ CLEAN MEGAD (MISCELLANEOUS) ×3
ELECT BLADE 6.5 EXT (BLADE) IMPLANT
ELECT REM PT RETURN 9FT ADLT (ELECTROSURGICAL) ×3
ELECTRODE BLDE 4.0 EZ CLN MEGD (MISCELLANEOUS) ×1 IMPLANT
ELECTRODE REM PT RTRN 9FT ADLT (ELECTROSURGICAL) ×1 IMPLANT
FACESHIELD WRAPAROUND (MASK) ×6 IMPLANT
FACESHIELD WRAPAROUND OR TEAM (MASK) ×2 IMPLANT
GAUZE XEROFORM 1X8 LF (GAUZE/BANDAGES/DRESSINGS) ×2 IMPLANT
GLOVE BIOGEL PI IND STRL 8 (GLOVE) ×2 IMPLANT
GLOVE BIOGEL PI INDICATOR 8 (GLOVE) ×4
GLOVE ECLIPSE 8.0 STRL XLNG CF (GLOVE) ×3 IMPLANT
GLOVE ORTHO TXT STRL SZ7.5 (GLOVE) ×6 IMPLANT
GOWN STRL REUS W/ TWL LRG LVL3 (GOWN DISPOSABLE) ×2 IMPLANT
GOWN STRL REUS W/ TWL XL LVL3 (GOWN DISPOSABLE) ×2 IMPLANT
GOWN STRL REUS W/TWL LRG LVL3 (GOWN DISPOSABLE) ×6
GOWN STRL REUS W/TWL XL LVL3 (GOWN DISPOSABLE) ×6
HANDPIECE INTERPULSE COAX TIP (DISPOSABLE) ×3
KIT BASIN OR (CUSTOM PROCEDURE TRAY) ×3 IMPLANT
KIT ROOM TURNOVER OR (KITS) ×3 IMPLANT
MANIFOLD NEPTUNE II (INSTRUMENTS) ×3 IMPLANT
NS IRRIG 1000ML POUR BTL (IV SOLUTION) ×3 IMPLANT
PACK TOTAL JOINT (CUSTOM PROCEDURE TRAY) ×3 IMPLANT
PAD ARMBOARD 7.5X6 YLW CONV (MISCELLANEOUS) ×3 IMPLANT
RETRACTOR WND ALEXIS 18 MED (MISCELLANEOUS) ×1 IMPLANT
RTRCTR WOUND ALEXIS 18CM MED (MISCELLANEOUS) ×3
SET HNDPC FAN SPRY TIP SCT (DISPOSABLE) ×1 IMPLANT
STAPLER VISISTAT 35W (STAPLE) ×2 IMPLANT
STRIP CLOSURE SKIN 1/2X4 (GAUZE/BANDAGES/DRESSINGS) ×4 IMPLANT
SUT ETHIBOND NAB CT1 #1 30IN (SUTURE) ×3 IMPLANT
SUT MNCRL AB 4-0 PS2 18 (SUTURE) IMPLANT
SUT VIC AB 0 CT1 27 (SUTURE) ×3
SUT VIC AB 0 CT1 27XBRD ANBCTR (SUTURE) ×1 IMPLANT
SUT VIC AB 1 CT1 27 (SUTURE) ×3
SUT VIC AB 1 CT1 27XBRD ANBCTR (SUTURE) ×1 IMPLANT
SUT VIC AB 2-0 CT1 27 (SUTURE) ×3
SUT VIC AB 2-0 CT1 TAPERPNT 27 (SUTURE) ×1 IMPLANT
TOWEL OR 17X24 6PK STRL BLUE (TOWEL DISPOSABLE) ×3 IMPLANT
TOWEL OR 17X26 10 PK STRL BLUE (TOWEL DISPOSABLE) ×3 IMPLANT
TRAY FOLEY CATH 16FRSI W/METER (SET/KITS/TRAYS/PACK) IMPLANT
WATER STERILE IRR 1000ML POUR (IV SOLUTION) ×6 IMPLANT
YANKAUER SUCT BULB TIP NO VENT (SUCTIONS) ×2 IMPLANT

## 2016-07-01 NOTE — Anesthesia Procedure Notes (Signed)
Procedure Name: Intubation Date/Time: 07/01/2016 3:56 PM Performed by: Oletta Lamas Pre-anesthesia Checklist: Patient identified, Emergency Drugs available, Suction available and Patient being monitored Patient Re-evaluated:Patient Re-evaluated prior to inductionOxygen Delivery Method: Circle System Utilized Preoxygenation: Pre-oxygenation with 100% oxygen Intubation Type: IV induction Ventilation: Two handed mask ventilation required and Oral airway inserted - appropriate to patient size Laryngoscope Size: Glidescope Grade View: Grade I Tube type: Oral Number of attempts: 1 Airway Equipment and Method: Stylet and Oral airway Placement Confirmation: ETT inserted through vocal cords under direct vision,  positive ETCO2 and breath sounds checked- equal and bilateral Secured at: 24 cm Tube secured with: Tape Dental Injury: Teeth and Oropharynx as per pre-operative assessment

## 2016-07-01 NOTE — H&P (Signed)
TOTAL HIP ADMISSION H&P  Patient is admitted for right total hip arthroplasty.  Subjective:  Chief Complaint: right hip pain  HPI: Randy Hebert, 50 y.o. male, has a history of pain and functional disability in the right hip(s) due to arthritis and patient has failed non-surgical conservative treatments for greater than 12 weeks to include flexibility and strengthening excercises, use of assistive devices, weight reduction as appropriate and activity modification.  Onset of symptoms was gradual starting 3 years ago with rapidlly worsening course since that time.The patient noted no past surgery on the right hip(s).  Patient currently rates pain in the right hip at 10 out of 10 with activity. Patient has night pain, worsening of pain with activity and weight bearing, trendelenberg gait, pain that interfers with activities of daily living, pain with passive range of motion and crepitus. Patient has evidence of subchondral cysts, subchondral sclerosis, periarticular osteophytes and joint space narrowing by imaging studies. This condition presents safety issues increasing the risk of falls.  There is no current active infection.  Patient Active Problem List   Diagnosis Date Noted  . Unilateral primary osteoarthritis, right hip 07/01/2016  . Type 2 diabetes mellitus without complication, without long-term current use of insulin ([place]) 04/26/2016  . Right hip pain 12/19/2015  . Chest pain 12/10/2015  . Chronic obstructive pulmonary disease ([place]) 08/27/2015  . Cigarette nicotine dependence with nicotine-induced disorder 08/27/2015  . Hyperlipidemia 08/27/2015  . Obesity, unspecified 08/27/2015  . Dyspnea 07/11/2015  . Acute respiratory failure with hypoxia ([place]) 06/02/2015  . Acute respiratory failure with hypoxemia ([place]) 06/02/2015  . Acute on chronic systolic heart failure ([place]) 06/02/2015  . ACE inhibitor-aggravated angioedema   . Chronic systolic CHF (congestive heart failure) ([place])  06/01/2015  . CAD (coronary artery disease) 05/31/2015  . STEMI (ST elevation myocardial infarction) ([place]) 05/31/2015  . Tobacco use disorder 05/31/2015  . Angioedema 05/30/2015  . Sinus pause secondary to beta blocker 02/09/2015  . Essential hypertension 02/09/2015  . ST elevation myocardial infarction (STEMI) of lateral wall, initial episode of care [facility]) 02/06/2015    Class: Hospitalized for   Past Medical History:  Diagnosis Date  . Anxiety   . Arthritis   . Back pain   . Bulging of cervical intervertebral disc   . CAD (coronary artery disease)    lateral STEMI 02/06/2015 00% D1 occlusion treated with Promus Premier 2.5 mm x 16 mm DES, 70% ramus stenosis, 40% mid RCA stenosis, 45% distal RCA stenosis, EF 45-50%  . CHF (congestive heart failure) ([place])   . COPD (chronic obstructive pulmonary disease) ([place])   . Depression   . Diabetes mellitus without complication ([place])   . Dyspnea   . GERD (gastroesophageal reflux disease)   . Headache   . Hip pain   . MI (myocardial infarction)   . Neck pain   . Otitis media   . Pleurisy   . Sinus pause    9 sec sinus pause on telemetry after started on coreg after MI, avoid AV nodal blocking agent    Past Surgical History:  Procedure Laterality Date  . CARDIAC CATHETERIZATION N/A 02/06/2015   Procedure: Left Heart Cath and Coronary Angiography;  Surgeon: Leonie Man, MD;  Location: [facility];  Service: Cardiovascular;  Laterality: N/A;  . CARDIAC CATHETERIZATION N/A 02/06/2015   Procedure: Coronary Stent Intervention;  Surgeon: Leonie Man, MD;  Location: [place] [facility];  Service: Cardiovascular;  Laterality: N/A;  . CORONARY ANGIOPLASTY WITH STENT  PLACEMENT    . INCISION / DRAINAGE HAND / FINGER      Prescriptions Prior to Admission  Medication Sig Dispense Refill Last Dose  . albuterol (PROVENTIL HFA;VENTOLIN HFA) 108 (90 Base) MCG/ACT inhaler Inhale 2 puffs into the lungs every 6 (six) hours as needed for wheezing or  shortness of breath. 1 Inhaler 4 06/30/2016 at Unknown time  . amLODipine (NORVASC) 10 MG tablet Take 1 tablet (10 mg total) by mouth daily. 30 tablet 3 07/01/2016 at Unknown time  . aspirin EC 81 MG tablet Take 81 mg by mouth daily.   week+  . atorvastatin (LIPITOR) 20 MG tablet Take 20 mg by mouth daily.  2 06/30/2016 at Unknown time  . Carboxymeth-Glycerin-Polysorb (REFRESH OPTIVE ADVANCED) 0.5-1-0.5 % SOLN Apply 1 drop to eye daily as needed (dry eyes).    06/30/2016 at Unknown time  . citalopram (CELEXA) 10 MG tablet Take 10 mg by mouth daily.   06/30/2016 at Unknown time  . cycloSPORINE (RESTASIS) 0.05 % ophthalmic emulsion Place 1 drop into both eyes 2 (two) times daily.   06/30/2016 at Unknown time  . ibuprofen (ADVIL,MOTRIN) 400 MG tablet Take 1 tablet (400 mg total) by mouth 3 (three) times daily. 30 tablet 0 Past Week at Unknown time  . isosorbide mononitrate (IMDUR) 30 MG 24 hr tablet Take 0.5 tablets (15 mg total) by mouth daily. 45 tablet 3 07/01/2016 at Unknown time  . Menthol, Topical Analgesic, (BENGAY EX) Apply 1 application topically at bedtime.    Past Month at Unknown time  . metFORMIN (GLUCOPHAGE) 1000 MG tablet Take 1 tablet (1,000 mg total) by mouth 2 (two) times daily with a meal. 60 tablet 3 06/30/2016 at Unknown time  . mometasone-formoterol (DULERA) 100-5 MCG/ACT AERO Inhale 2 puffs into the lungs 2 (two) times daily. 1 Inhaler 4 07/01/2016 at Unknown time  . Olopatadine HCl (PAZEO) 0.7 % SOLN Apply 1 drop to eye daily.   06/30/2016 at Unknown time  . Omega-3 Fatty Acids (FISH OIL PO) Take 1,200 mg by mouth 2 (two) times daily.   Past Week at Unknown time  . oxymetazoline (AFRIN) 0.05 % nasal spray Place 1 spray into both nostrils 2 (two) times daily as needed for congestion.   Past Week at Unknown time  . traZODone (DESYREL) 100 MG tablet Take 100 mg by mouth at bedtime.   week  . albuterol (PROVENTIL) (2.5 MG/3ML) 0.083% nebulizer solution Take 3 mLs (2.5 mg total) by  nebulization every 6 (six) hours as needed for wheezing or shortness of breath. 150 mL 1 More than a month at Unknown time  . nitroGLYCERIN (NITROSTAT) 0.4 MG SL tablet Place 1 tablet (0.4 mg total) under the tongue every 5 (five) minutes as needed for chest pain. 25 tablet 3 More than a month at Unknown time  . predniSONE (DELTASONE) 20 MG tablet Take 2 tablets (40 mg total) by mouth daily with breakfast. (Patient not taking: Reported on 06/20/2016) 10 tablet 0 Completed Course at Unknown time   Allergies  Allergen Reactions  . Carvedilol Other (See Comments)    Sinus pause on telemetry >3 seconds. Longest one 9 sec. No AV nodal agent  . Lisinopril Anaphylaxis, Shortness Of Breath and Swelling    Angioedema, required intubation and mechanical ventilation  . Amoxicillin Nausea And Vomiting    Has patient had a PCN reaction causing immediate rash, facial/tongue/throat swelling, SOB or lightheadedness with hypotension: Unknown Has patient had a PCN reaction causing severe rash involving mucus  membranes or skin necrosis: Unknown Has patient had a PCN reaction that required hospitalization Unknown Has patient had a PCN reaction occurring within the last 10 years: No If all of the above answers are "NO", then may proceed with Cephalosporin use.     Social History  Substance Use Topics  . Smoking status: Current Some Day Smoker    Packs/day: 0.50    Years: 46.00    Types: Cigarettes    Start date: 06/07/1979  . Smokeless tobacco: Former Systems developer    Types: Chew  . Alcohol use No    Family History  Problem Relation Age of Onset  . Heart attack Father   . Stroke Father      Review of Systems  Musculoskeletal: Positive for joint pain.  All other systems reviewed and are negative.   Objective:  Physical Exam  Constitutional: He is oriented to person, place, and time. He appears well-developed and well-nourished.  HENT:  Head: Normocephalic and atraumatic.  Eyes: EOM are normal. Pupils  are equal, round, and reactive to light.  Neck: Normal range of motion. Neck supple.  Cardiovascular: Normal rate.   Respiratory: Effort normal.  GI: Soft. Bowel sounds are normal.  Musculoskeletal:       Right hip: He exhibits decreased range of motion, decreased strength, tenderness and bony tenderness.  Neurological: He is alert and oriented to person, place, and time.  Skin: Skin is warm and dry.  Psychiatric: He has a normal mood and affect.    Vital signs in last 24 hours: Temp:  [98.8 F (37.1 C)] 98.8 F (37.1 C) (10/31 1151) Pulse Rate:  [93] 93 (10/31 1151) Resp:  [18] 18 (10/31 1151) BP: (130)/(84) 130/84 (10/31 1151) SpO2:  [95 %] 95 % (10/31 1151)  Labs:   Estimated body mass index is 38 kg/m as calculated from the following:   Height as of 06/09/16: '5\' 5"'$  (1.651 m).   Weight as of 06/24/16: 228 lb 6 oz (103.6 kg).   Imaging Review Plain radiographs demonstrate severe degenerative joint disease of the right hip(s). The bone quality appears to be good for age and reported activity level.  Assessment/Plan:  End stage arthritis, right hip(s)  The patient history, physical examination, clinical judgement of the provider and imaging studies are consistent with end stage degenerative joint disease of the right hip(s) and total hip arthroplasty is deemed medically necessary. The treatment options including medical management, injection therapy, arthroscopy and arthroplasty were discussed at length. The risks and benefits of total hip arthroplasty were presented and reviewed. The risks due to aseptic loosening, infection, stiffness, dislocation/subluxation,  thromboembolic complications and other imponderables were discussed.  The patient acknowledged the explanation, agreed to proceed with the plan and consent was signed. Patient is being admitted for inpatient treatment for surgery, pain control, PT, OT, prophylactic antibiotics, VTE prophylaxis, progressive ambulation and  ADL's and discharge planning.The patient is planning to be discharged home with home health services

## 2016-07-01 NOTE — Anesthesia Preprocedure Evaluation (Signed)
Anesthesia Evaluation  Patient identified by MRN, date of birth, ID band Patient awake    Reviewed: Allergy & Precautions, NPO status , Patient's Chart, lab work & pertinent test results  Airway Mallampati: II   Neck ROM: Full   Comment: Thick neck full beard  Dental  (+) Teeth Intact, Poor Dentition, Dental Advisory Given   Pulmonary shortness of breath, COPD, Current Smoker,    breath sounds clear to auscultation       Cardiovascular hypertension, + CAD, + Past MI and +CHF   Rhythm:Regular Rate:Normal  Decreased EF Left ventricle: Moderately severe hypokinesis of mid   anterolateral segment, apical lateral segment, mid/apical   anterior segments. EF is 40%. The cavity size was normal. Wall   thickness was increased in a pattern of mild LVH. - Aortic valve: Sclerosis without stenosis. There was no   significant regurgitation. - Right ventricle: The cavity size was normal. Systolic function   was normal.    Neuro/Psych  Headaches,    GI/Hepatic GERD  ,  Endo/Other  diabetes, Poorly Controlled  Renal/GU      Musculoskeletal  (+) Arthritis ,   Abdominal (+) + obese,   Peds  Hematology   Anesthesia Other Findings   Reproductive/Obstetrics                             Anesthesia Physical Anesthesia Plan  ASA: III  Anesthesia Plan: General   Post-op Pain Management:    Induction: Intravenous  Airway Management Planned: Oral ETT and Video Laryngoscope Planned  Additional Equipment:   Intra-op Plan:   Post-operative Plan: Extubation in OR  Informed Consent: I have reviewed the patients History and Physical, chart, labs and discussed the procedure including the risks, benefits and alternatives for the proposed anesthesia with the patient or authorized representative who has indicated his/her understanding and acceptance.   Dental advisory given  Plan Discussed with:  CRNA  Anesthesia Plan Comments:         Anesthesia Quick Evaluation

## 2016-07-01 NOTE — Transfer of Care (Signed)
Immediate Anesthesia Transfer of Care Note  Patient: Randy Hebert  Procedure(s) Performed: Procedure(s): RIGHT TOTAL HIP ARTHROPLASTY ANTERIOR APPROACH (Right)  Patient Location: PACU  Anesthesia Type:General  Level of Consciousness: awake, alert  and oriented  Airway & Oxygen Therapy: Patient Spontanous Breathing and Patient connected to nasal cannula oxygen  Post-op Assessment: Report given to RN, Post -op Vital signs reviewed and stable and Patient moving all extremities  Post vital signs: Reviewed and stable  Last Vitals:  Vitals:   07/01/16 1151 07/01/16 1751  BP: 130/84 140/76  Pulse: 93   Resp: 18 (!) 21  Temp: 37.1 C 36.3 C    Last Pain:  Vitals:   07/01/16 1751  TempSrc:   PainSc: 0-No pain         Complications: No apparent anesthesia complications

## 2016-07-01 NOTE — Brief Op Note (Signed)
07/01/2016  5:22 PM  PATIENT:  Randy Hebert  50 y.o. male  PRE-OPERATIVE DIAGNOSIS:  osteoarthritis right hip  POST-OPERATIVE DIAGNOSIS:  osteoarthritis right hip  PROCEDURE:  Procedure(s): RIGHT TOTAL HIP ARTHROPLASTY ANTERIOR APPROACH (Right)  SURGEON:  Surgeon(s) and Role:    * Mcarthur Rossetti, MD - Primary  PHYSICIAN ASSISTANT: Benita Stabile, PA-C  ANESTHESIA:   general  EBL:  Total I/O In: 1000 [I.V.:1000] Out: 300 [Blood:300]  COUNTS:  YES  DICTATION: .Other Dictation: Dictation Number [id number]  PLAN OF CARE: Admit to inpatient   PATIENT DISPOSITION:  PACU - hemodynamically stable.   Delay start of Pharmacological VTE agent (>24hrs) due to surgical blood loss or risk of bleeding: no

## 2016-07-02 ENCOUNTER — Telehealth (INDEPENDENT_AMBULATORY_CARE_PROVIDER_SITE_OTHER): Payer: Self-pay

## 2016-07-02 ENCOUNTER — Other Ambulatory Visit (INDEPENDENT_AMBULATORY_CARE_PROVIDER_SITE_OTHER): Payer: Self-pay | Admitting: Physician Assistant

## 2016-07-02 ENCOUNTER — Encounter (HOSPITAL_COMMUNITY): Payer: Self-pay | Admitting: General Practice

## 2016-07-02 DIAGNOSIS — M79669 Pain in unspecified lower leg: Secondary | ICD-10-CM

## 2016-07-02 LAB — BASIC METABOLIC PANEL
Anion gap: 8 (ref 5–15)
BUN: 14 mg/dL (ref 6–20)
CHLORIDE: 100 mmol/L — AB (ref 101–111)
CO2: 26 mmol/L (ref 22–32)
Calcium: 8.1 mg/dL — ABNORMAL LOW (ref 8.9–10.3)
Creatinine, Ser: 1.09 mg/dL (ref 0.61–1.24)
GFR calc Af Amer: >60 mL/min (ref >60)
GFR calc non Af Amer: >60 mL/min (ref >60)
GLUCOSE: 193 mg/dL — AB (ref 65–99)
POTASSIUM: 3.8 mmol/L (ref 3.5–5.1)
Sodium: 134 mmol/L — ABNORMAL LOW (ref 135–145)

## 2016-07-02 LAB — CBC
HEMATOCRIT: 39.2 % (ref 39.0–52.0)
HEMOGLOBIN: 12.8 g/dL — AB (ref 13.0–17.0)
MCH: 27.5 pg (ref 26.0–34.0)
MCHC: 32.7 g/dL (ref 30.0–36.0)
MCV: 84.3 fL (ref 78.0–100.0)
Platelets: 237 10*3/uL (ref 150–400)
RBC: 4.65 MIL/uL (ref 4.22–5.81)
RDW: 14.7 % (ref 11.5–15.5)
WBC: 11.7 10*3/uL — ABNORMAL HIGH (ref 4.0–10.5)

## 2016-07-02 LAB — GLUCOSE, CAPILLARY
GLUCOSE-CAPILLARY: 178 mg/dL — AB (ref 65–99)
GLUCOSE-CAPILLARY: 188 mg/dL — AB (ref 65–99)
Glucose-Capillary: 192 mg/dL — ABNORMAL HIGH (ref 65–99)
Glucose-Capillary: 182 mg/dL — ABNORMAL HIGH (ref 65–99)

## 2016-07-02 MED ORDER — NICOTINE 21 MG/24HR TD PT24
21.0000 mg | MEDICATED_PATCH | Freq: Every day | TRANSDERMAL | Status: DC
  Administered 2016-07-03 – 2016-07-04 (×2): 21 mg via TRANSDERMAL
  Filled 2016-07-02 (×2): qty 1

## 2016-07-02 MED ORDER — INFLUENZA VAC SPLIT QUAD 0.5 ML IM SUSY
0.5000 mL | PREFILLED_SYRINGE | INTRAMUSCULAR | Status: AC
Start: 2016-07-03 — End: 2016-07-03
  Administered 2016-07-03: 0.5 mL via INTRAMUSCULAR
  Filled 2016-07-02: qty 0.5

## 2016-07-02 MED ORDER — NICOTINE 7 MG/24HR TD PT24: 7.0000 mg | MEDICATED_PATCH | Freq: Every day | TRANSDERMAL | Status: DC

## 2016-07-02 NOTE — Progress Notes (Signed)
Subjective: 1 Day Post-Op Procedure(s) (LRB): RIGHT TOTAL HIP ARTHROPLASTY ANTERIOR APPROACH (Right) Patient reports pain as moderate.  Labs stable.  Objective: Vital signs in last 24 hours: Temp:  [97.3 F (36.3 C)-98.8 F (37.1 C)] 98 F (36.7 C) (11/01 0445) Pulse Rate:  [77-93] 82 (11/01 0445) Resp:  [13-23] 18 (11/01 0445) BP: (102-143)/(71-93) 125/76 (11/01 0445) SpO2:  [91 %-97 %] 92 % (11/01 0445) Weight:  [230 lb 8 oz (104.6 kg)] 230 lb 8 oz (104.6 kg) (10/31 2124)  Intake/Output from previous day: 10/31 0701 - 11/01 0700 In: 1560 [P.O.:120; I.V.:1440] Out: 500 [Urine:200; Blood:300] Intake/Output this shift: No intake/output data recorded.   Recent Labs  07/02/16 0507  HGB 12.8*    Recent Labs  07/02/16 0507  WBC 11.7*  RBC 4.65  HCT 39.2  PLT 237    Recent Labs  07/02/16 0507  NA 134*  K 3.8  CL 100*  CO2 26  BUN 14  CREATININE 1.09  GLUCOSE 193*  CALCIUM 8.1*   No results for input(s): LABPT, INR in the last 72 hours.  Sensation intact distally Intact pulses distally Dorsiflexion/Plantar flexion intact Incision: scant drainage  Assessment/Plan: 1 Day Post-Op Procedure(s) (LRB): RIGHT TOTAL HIP ARTHROPLASTY ANTERIOR APPROACH (Right) Up with therapy  Mcarthur Rossetti 07/02/2016, 7:04 AM

## 2016-07-02 NOTE — Progress Notes (Signed)
PT Cancellation Note  Patient Details Name: ELIC VENCILL MRN: [id number] DOB: 03-19-1966   Cancelled Treatment:    Reason Eval/Treat Not Completed: Medical issues which prohibited therapy.  Pt indicating pain in R calf and posterior knee, and very concerned about DVT, wanting to see MD or PA.  RN aware.  Will f/u tomorrow.     Catarina Hartshorn, [place] 07/02/2016, 2:09 PM

## 2016-07-02 NOTE — Care Management Note (Signed)
Case Management Note  Patient Details  Name: Randy Hebert MRN: [id number] Date of Birth: [birth year]  Subjective/Objective:                    Action/Plan:  Confirmed face sheet information with patient.  Expected Discharge Date:                  Expected Discharge Plan:  Randy Hebert  In-House Referral:     Discharge planning Services  CM Consult  Post Acute Care Choice:  Durable Medical Equipment, Home Health Choice offered to:  Spouse, Patient  DME Arranged:  [place] rolling DME Agency:     HH Arranged:  PT [place]:  [facility] (now [facility])  Status of Service:  Completed, signed off  If discussed at [employer] of Stay Meetings, dates discussed:    Additional Comments:  Randy Favre, RN 07/02/2016, 11:11 AM

## 2016-07-02 NOTE — Evaluation (Signed)
Occupational Therapy Evaluation Patient Details Name: Randy Hebert MRN: [id number] DOB: [birth year] Today's Date: 07/02/2016    History of Present Illness pt presents post R THR.  pt with hx of DM, COPD, HF, CAD, MI, Anxiety, and Depression.   Clinical Impression   PTA, pt required assistance to don and doff socks and utilized single point cane for functional mobility in the community and in his home secondary to pain. Pt currently requires min guard assist for functional mobility with RW and moderate assist for LB ADL. Pt lives with wife who is able to provide 24 hour assistance/supervision during recovery. No OT follow-up recommended post-acute D/C. Pt would benefit from continued OT services while admitted to improve independence with ADL and functional mobility. OT will continue to follow.    Follow Up Recommendations  No OT follow up;Supervision/Assistance - 24 hour    Equipment Recommendations  None recommended by OT       Precautions / Restrictions Precautions Precautions: Fall Restrictions Weight Bearing Restrictions: Yes RLE Weight Bearing: Weight bearing as tolerated      Mobility Bed Mobility Overal bed mobility: Needs Assistance Bed Mobility: Supine to Sit     Supine to sit: Min assist     General bed mobility comments: Out of bed in chair on OT arrival.  Transfers Overall transfer level: Needs assistance Equipment used: Rolling Thiemann (2 wheeled) Transfers: Sit to/from Stand Sit to Stand: Min guard         General transfer comment: Cues for safe hand placement    Balance Overall balance assessment: Needs assistance Sitting-balance support: No upper extremity supported;Feet supported Sitting balance-Leahy Scale: Good     Standing balance support: Single extremity supported;Bilateral upper extremity supported;During functional activity Standing balance-Leahy Scale: Poor Standing balance comment: Able to stand at sink with single UE support to  brush teeth. Otherwise reliant on RW for support                            ADL Overall ADL's : Needs assistance/impaired     Grooming: Oral care;Standing;Min guard   Upper Body Bathing: Set up;Sitting   Lower Body Bathing: Moderate assistance;Sit to/from stand   Upper Body Dressing : Set up;Sitting   Lower Body Dressing: Moderate assistance;Sit to/from stand   Toilet Transfer: Min guard;Ambulation;RW;BSC   Toileting- Water quality scientist and Hygiene: Min guard;Sit to/from stand       Functional mobility during ADLs: Min guard;Rolling [place] General ADL Comments: Educated pt and wife on energy conservation, use of AE to improve independence with ADL, and safe use of DME.     Vision Vision Assessment?: No apparent visual deficits          Pertinent Vitals/Pain Pain Assessment: 0-10 Pain Score: 2  Pain Location: R hip Pain Descriptors / Indicators: Tightness Pain Intervention(s): Monitored during session;Limited activity within patient's tolerance;Repositioned     Hand Dominance Right   Extremity/Trunk Assessment Upper Extremity Assessment Upper Extremity Assessment: Overall WFL for tasks assessed   Lower Extremity Assessment Lower Extremity Assessment: RLE deficits/detail RLE Deficits / Details: Decreased strength and ROM as expected post-operatively. RLE Coordination: decreased fine motor   Cervical / Trunk Assessment Cervical / Trunk Assessment: Normal   Communication Communication Communication: No difficulties   Cognition Arousal/Alertness: Awake/alert Behavior During Therapy: WFL for tasks assessed/performed Overall Cognitive Status: Within Functional Limits for tasks assessed  Exercises Exercises: Total Joint          Home Living Family/patient expects to be discharged to:: Private residence Living Arrangements: Spouse/significant other Available Help at Discharge: Family;Available 24 hours/day Type  of Home: House Home Access: Stairs to enter [employer] of Steps: 2 Entrance Stairs-Rails: Right Home Layout: One level     Bathroom Shower/Tub: Tub/shower unit Shower/tub characteristics: Curtain Biochemist, clinical: Standard     Home Equipment: Environmental consultant - 2 wheels;Bedside commode;Cane - single point          Prior Functioning/Environment Level of Independence: Independent with assistive device(s)        Comments: Used cane.        OT Problem List: Decreased strength;Decreased range of motion;Decreased activity tolerance;Impaired balance (sitting and/or standing);Decreased safety awareness;Decreased knowledge of use of DME or AE;Decreased knowledge of precautions;Pain   OT Treatment/Interventions: Self-care/ADL training;Therapeutic exercise;Patient/family education;Balance training;DME and/or AE instruction    OT Goals(Current goals can be found in the care plan section) Acute Rehab OT Goals Patient Stated Goal: Home OT Goal Formulation: With patient Time For Goal Achievement: 07/16/16 Potential to Achieve Goals: Good  OT Frequency: Min 2X/week    End of Session Equipment Utilized During Treatment: Gait belt;Rolling [place]  Activity Tolerance: Patient tolerated treatment well Patient left: in chair;with call bell/phone within reach;Other (comment) (With respiratory therapy in room)   Time: 0712-1975 OT Time Calculation (min): 21 min Charges:  OT General Charges $OT Visit: 1 Procedure OT Evaluation $OT Eval Moderate Complexity: 1 Procedure  Norman Herrlich, OTR/L [phone] 07/02/2016, 10:01 AM

## 2016-07-02 NOTE — Progress Notes (Signed)
Patient ID: Randy Hebert, male   DOB: 03-May-1966, 50 y.o.   MRN: [id number] No evidence of DVT on exam.  Right calk is soft and his right foot shows no swelling at all.

## 2016-07-02 NOTE — Telephone Encounter (Signed)
Nurse from [facility] called for two reasons. 1. 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 Wants to know if anything needs to be done and also the pt is wanting an rx for a nicotine patch. Please advise.

## 2016-07-02 NOTE — Op Note (Signed)
NAME:  Randy Hebert, Randy Hebert NO.:  [id number]  MEDICAL RECORD NO.:  [id number]  LOCATION:  6N06C                        FACILITY:  [facility]  PHYSICIAN:  Lind Guest. Ninfa Linden, M.D.DATE OF BIRTH:  1965/09/29  DATE OF PROCEDURE:  07/01/2016 DATE OF DISCHARGE:                              OPERATIVE REPORT   POSTOPERATIVE DIAGNOSIS:  Primary osteoarthritis and degenerative joint disease of right hip.  POSTOPERATIVE DIAGNOSIS:  Primary osteoarthritis and degenerative joint disease of right hip.  PROCEDURE:  Right total hip arthroplasty through direct anterior approach.  IMPLANTS:  DePuy Sector Gription acetabular component size 52, size 36+ 0 neutral polyethylene liner, size 12 Corail femoral component with varus offset (KLA), size 36+ 1.5 hip ball.  SURGEON:  Lind Guest. Ninfa Linden, M.D.  ASSISTANT:  Erskine Emery, PA-C.  ANESTHESIA:  General.  ANTIBIOTICS:  900 mg of IV clindamycin.  BLOOD LOSS:  300-350 mL.  COMPLICATIONS:  None.  INDICATIONS:  [name] is a 50 year old gentleman, well known to me. He has had about 3- to 4-year-history of worsening right hip pain, and he did come in the office with x-ray showing complete loss of the superolateral joint space of his right hip.  He is someone who is diabetic, and we delayed the surgery until we get his hemoglobin A1c down below 8, which he finally did.  He has worked on clearance through his cardiologist as well, which was obtained at this point.  With his pain being daily, his decreased quality of life, his decreased mobility, he does wish to proceed with a total hip arthroplasty.  We discussed the risk of acute blood loss anemia, nerve and vessel injury, fracture, infection, dislocation, and DVT.  He understands our goals are decreased pain, improved mobility, and overall improved quality of life.  PROCEDURE DESCRIPTION:  After informed consent was obtained, appropriate right hip was marked.  He was  brought to the operating room, where general anesthesia was obtained while he was on the stretcher.  Traction boots were placed on both his feet.  Next, he was placed supine on the Hana fracture table with the perineal post in place, and both legs in inline skeletal traction devices, but no traction applied.  His right operative hip was then prepped and draped with DuraPrep and sterile drapes.  Time-out was called to identify correct patient, correct right hip.  We then made an incision just inferior and posterior to the anterior superior iliac spine and carried this only slightly obliquely down the leg.  We dissected down to tensor fascia lata muscle.  The tensor fascia was then divided longitudinally, so we could proceed with direct anterior approach to the hip.  We identified and cauterized the circumflex vessels and identified the hip capsule.  I opened up the hip capsule in an L-type format, finding a moderate joint effusion and significant periarticular osteophytes.  I placed the Cobra retractors around the mediolateral femoral neck and then made our femoral neck cut with an oscillating saw and completed this with on osteotome.  I placed a corkscrew guide in the femoral head and removed the femoral head in its entirety and found to be completely devoid of cartilage.  We then cleaned  the acetabulum and remnants of the acetabular labrum and other debris.  I placed a bent Hohmann over the medial acetabular rim and then began reaming under direct visualization from a size 42 reamer up to a size 52 with all reamers under direct visualization, the last reamer under direct fluoroscopy, so we could obtain our depth of reaming, our inclination, abduction and adduction.  We then placed the real DePuy Sector Gription acetabular component under direct visualization and fluoroscopy, and we placed this without difficulty.  We then placed a 36+ 0 neutral liner for that size of acetabular component.   We then with the leg externally rotated, extended, and adducted.  We placed a Mueller retractor medially and a Hohmann retractor behind the greater trochanter, released lateral joint capsule, and used a box cutting osteotome in the inner femoral canal and a rongeur to lateralize.  I then began broaching from a size 8 broaching system up to the size 12. With the size 12 in place, we trialed a varus offset femoral neck based on his anatomy and 36+ 1.5 hip ball.  We brought the leg back over and up with traction and rotation reducing the pelvis.  We were pleased with leg length, offset, range of motion, and stability.  We then dislocated the hip and removed the trial components.  We were able to placed the real Corail femoral component size 12 with a varus offset and the real 36+ 1.5 ceramic hip ball reduced this in the acetabulum and again it was stable.  We then copiously irrigated the soft tissue with normal saline solution using pulsatile lavage.  There was no joint capsule to close. We closed the tensor fascia with running #1 Vicryl suture, 0 Vicryl in the deep tissue, 2-0 Vicryl in subcutaneous tissue, and interrupted staples on the skin.  Xeroform and Aquacel dressings were applied.  He was taken off the Hana table, awakened, extubated, and taken to the recovery room in stable condition.  All final counts were correct. There were no complications noted.  Of note, Erskine Emery, PA-C assisted in the entire case.  His assistance was crucial for facilitating all aspects of this case.     Lind Guest. Ninfa Linden, M.D.     CYB/MEDQ  D:  07/01/2016  T:  07/02/2016  Job:  [id number]

## 2016-07-02 NOTE — Evaluation (Signed)
Physical Therapy Evaluation Patient Details Name: Randy Hebert MRN: [id number] DOB: [birth year] Today's Date: 07/02/2016   History of Present Illness  pt presents post R THR.  pt with hx of DM, COPD, HF, CAD, MI, Anxiety, and Depression.  Clinical Impression  Pt frustrated this morning about having to wait until 9am for breakfast and that he had to wait yesterday for his surgery.  Listened and supported pt.  Pt eventually agreeable to PT and OOB.  Feel pt is moving well and that he should make great progress.  Will continue to follow.      Follow Up Recommendations Home health PT;Supervision for mobility/OOB    Equipment Recommendations  Rolling [place] with 5" wheels    Recommendations for Other Services       Precautions / Restrictions Precautions Precautions: Fall Restrictions Weight Bearing Restrictions: Yes RLE Weight Bearing: Weight bearing as tolerated      Mobility  Bed Mobility Overal bed mobility: Needs Assistance Bed Mobility: Supine to Sit     Supine to sit: Min assist     General bed mobility comments: pt almost able to complete coming to sitting, but just needed MinA to complete last part of bringing trunk up to sitting.    Transfers Overall transfer level: Needs assistance Equipment used: Rolling [place] (2 wheeled) Transfers: Sit to/from Stand Sit to Stand: Min guard         General transfer comment: cue for UE use and encouragement  Ambulation/Gait Ambulation/Gait assistance: Min guard Ambulation Distance (Feet): 5 Feet Assistive device: Rolling [place] (2 wheeled) Gait Pattern/deviations: Step-to pattern;Decreased step length - left;Decreased stance time - right     General Gait Details: pt ambulated to recliner.  cues for technique and RW management.    Stairs            Wheelchair Mobility    Modified Rankin (Stroke Patients Only)       Balance Overall balance assessment: Needs assistance Sitting-balance support: No upper  extremity supported;Feet supported Sitting balance-Leahy Scale: Good     Standing balance support: Bilateral upper extremity supported;Single extremity supported;During functional activity Standing balance-Leahy Scale: Poor                               Pertinent Vitals/Pain Pain Assessment: 0-10 Pain Score: 4  Pain Location: R hip Pain Descriptors / Indicators: Tightness;Grimacing;Guarding Pain Intervention(s): Monitored during session;Premedicated before session;Repositioned    Home Living Family/patient expects to be discharged to:: Private residence Living Arrangements: Spouse/significant other Available Help at Discharge: Family;Available 24 hours/day Type of Home: House Home Access: Stairs to enter Entrance Stairs-Rails: Right Entrance Stairs-Number of Steps: 2 Home Layout: One level Home Equipment: [place] - standard;Bedside commode;Cane - single point      Prior Function Level of Independence: Independent with assistive device(s)         Comments: Used cane.     Hand Dominance        Extremity/Trunk Assessment   Upper Extremity Assessment: Defer to OT evaluation           Lower Extremity Assessment: RLE deficits/detail RLE Deficits / Details: ROM functional and strength limited by post-op pain.  Sensation intact.    Cervical / Trunk Assessment: Normal  Communication   Communication: No difficulties  Cognition Arousal/Alertness: Awake/alert Behavior During Therapy: WFL for tasks assessed/performed Overall Cognitive Status: Within Functional Limits for tasks assessed  General Comments      Exercises Total Joint Exercises Ankle Circles/Pumps: AROM;Both;15 reps Hip ABduction/ADduction:  (pt ed on performing these once LEs elevated.) Long Arc Quad: AROM;Right;15 reps   Assessment/Plan    PT Assessment Patient needs continued PT services  PT Problem List Decreased strength;Decreased activity  tolerance;Decreased balance;Decreased mobility;Decreased coordination;Decreased knowledge of use of DME;Pain          PT Treatment Interventions DME instruction;Gait training;Stair training;Functional mobility training;Therapeutic activities;Therapeutic exercise;Balance training;Patient/family education    PT Goals (Current goals can be found in the Care Plan section)  Acute Rehab PT Goals Patient Stated Goal: Home PT Goal Formulation: With patient Time For Goal Achievement: 07/09/16 Potential to Achieve Goals: Good    Frequency 7X/week   Barriers to discharge        Co-evaluation               End of Session Equipment Utilized During Treatment: Gait belt Activity Tolerance: Patient tolerated treatment well Patient left: in chair;with call bell/phone within reach;with family/visitor present Nurse Communication: Mobility status         Time: 3007-6226 PT Time Calculation (min) (ACUTE ONLY): 15 min   Charges:   PT Evaluation $PT Eval Moderate Complexity: 1 Procedure     PT G CodesCatarina Hartshorn, Stephenson 07/02/2016, 9:51 AM

## 2016-07-03 LAB — GLUCOSE, CAPILLARY
GLUCOSE-CAPILLARY: 150 mg/dL — AB (ref 65–99)
GLUCOSE-CAPILLARY: 178 mg/dL — AB (ref 65–99)
GLUCOSE-CAPILLARY: 152 mg/dL — AB (ref 65–99)
Glucose-Capillary: 188 mg/dL — ABNORMAL HIGH (ref 65–99)

## 2016-07-03 MED ORDER — WHITE PETROLATUM GEL
Status: AC
  Administered 2016-07-03: 20:00:00
  Filled 2016-07-03: qty 1

## 2016-07-03 MED ORDER — ASPIRIN EC 81 MG PO TBEC: 81.0000 mg | DELAYED_RELEASE_TABLET | Freq: Two times a day (BID) | ORAL | 0 refills | Status: DC

## 2016-07-03 MED ORDER — OXYCODONE-ACETAMINOPHEN 5-325 MG PO TABS: 1.0000 | ORAL_TABLET | ORAL | 0 refills | Status: DC | PRN

## 2016-07-03 NOTE — Progress Notes (Addendum)
Occupational Therapy Treatment/Discharge Patient Details Name: Randy Hebert MRN: [id number] DOB: 07-31-1966 Today's Date: 07/03/2016    History of present illness pt presents post R THR anterior approach.  pt with hx of DM, COPD, HF, CAD, MI, Anxiety, and Depression.   OT comments  Pt progressing well toward OT goals. Instructed pt and significant other on safe tub transfers utilizing 3-in-1 and tub bench. Pt required min assist for tub transfer and feel that pt will be safer with tub bench at this time, pt is in agreement. Updated DME recommendations. Educated pt on use of AE for LB ADL and pt demonstrates much improved independence requiring only supervision for safety with sit<>stand for LB dressing. Pt required supervision for functional ambulation to bathroom this session. Completed education concerning post-operative ADL and pt demonstrates understanding and reports no further questions/concerns for OT. Pt has no further acute OT needs. D/C plan remains appropriate. OT will sign off.     Follow Up Recommendations  No OT follow up;Supervision/Assistance - 24 hour    Equipment Recommendations  Tub/shower bench       Precautions / Restrictions Precautions Precautions: Fall Restrictions Weight Bearing Restrictions: Yes RLE Weight Bearing: Weight bearing as tolerated       Mobility Bed Mobility Overal bed mobility: Needs Assistance Bed Mobility: Supine to Sit     Supine to sit: Min assist     General bed mobility comments: Pt sitting on EOB with wife supervising on OT arrival  Transfers Overall transfer level: Needs assistance Equipment used: Rolling [place] (2 wheeled) Transfers: Sit to/from Stand Sit to Stand: Supervision         General transfer comment: Cues for safe hand placement    Balance Overall balance assessment: Needs assistance Sitting-balance support: No upper extremity supported;Feet supported Sitting balance-Leahy Scale: Good     Standing  balance support: Single extremity supported;During functional activity Standing balance-Leahy Scale: Poor Standing balance comment: Able to remove single UE from RW briefly during static standing.                   ADL Overall ADL's : Needs assistance/impaired             Lower Body Bathing: Supervison/ safety;Sit to/from stand;With adaptive equipment       Lower Body Dressing: Supervision/safety;Sit to/from stand;With adaptive equipment   Toilet Transfer: Supervision/safety;Ambulation;RW;BSC       Tub/ Shower Transfer: Minimal assistance;Ambulation;3 in 1;Rolling [place]   Functional mobility during ADLs: Supervision/safety;Rolling [place] General ADL Comments: Completed education concerning use of AE for LB ADL and safe use of DME for shower transfer.                Cognition   Behavior During Therapy: WFL for tasks assessed/performed Overall Cognitive Status: Within Functional Limits for tasks assessed                         Exercises Total Joint Exercises Ankle Circles/Pumps: AROM;Both;10 reps;Supine Quad Sets: AROM;10 reps;Supine;Both Heel Slides: AROM;Right;Supine;5 reps (2x5; painful)   Shoulder Instructions       General Comments      Pertinent Vitals/ Pain       Pain Assessment: Faces Pain Score: 7  Faces Pain Scale: Hurts little more Pain Location: R hip Pain Descriptors / Indicators: Aching Pain Intervention(s): Monitored during session;Limited activity within patient's tolerance;Repositioned         Frequency  Min 2X/week  Progress Toward Goals  OT Goals(current goals can now be found in the care plan section)  Progress towards OT goals: Progressing toward goals  Acute Rehab OT Goals Patient Stated Goal: Home OT Goal Formulation: With patient Time For Goal Achievement: 07/16/16 Potential to Achieve Goals: Good  Plan Discharge plan remains appropriate       End of Session Equipment Utilized During  Treatment: Gait belt;Rolling [place]   Activity Tolerance Patient tolerated treatment well   Patient Left in chair;with call bell/phone within reach;with family/visitor present   Nurse Communication Mobility status        Time: 3790-2409 OT Time Calculation (min): 26 min  Charges: OT General Charges $OT Visit: 1 Procedure OT Treatments $Self Care/Home Management : 23-37 mins  Norman Herrlich, OTR/L [phone] 07/03/2016, 1:09 PM

## 2016-07-03 NOTE — Progress Notes (Signed)
Physical Therapy Treatment Patient Details Name: Randy Hebert MRN: [id number] DOB: [birth year] Today's Date: 07/03/2016    History of Present Illness pt presents post R THR.  pt with hx of DM, COPD, HF, CAD, MI, Anxiety, and Depression.    PT Comments    Pt was able to ambulate increased distance today. Pt c/o SOB and fatigue with ambulation, but was able to tolerate treatment with frequent breaks and cues for deep breathing. Pt's girlfriend, Randy Hebert was educated on car transfers and goals for ascending/descending stairs on bus with [facility] therapist.   Follow Up Recommendations  Home health PT;Supervision for mobility/OOB     Equipment Recommendations  Rolling [place] with 5" wheels    Recommendations for Other Services       Precautions / Restrictions Precautions Precautions: Fall Restrictions Weight Bearing Restrictions: No RLE Weight Bearing: Weight bearing as tolerated    Mobility  Bed Mobility Overal bed mobility: Needs Assistance Bed Mobility: Supine to Sit     Supine to sit: Min assist     General bed mobility comments: pt just needed MinA to lift RLE when transferring from sit to stand  Transfers Overall transfer level: Needs assistance Equipment used: Rolling [place] (2 wheeled) Transfers: Sit to/from Stand Sit to Stand: Min guard         General transfer comment: Cues for safe hand placement  Ambulation/Gait Ambulation/Gait assistance: Min guard Ambulation Distance (Feet): 45 Feet Assistive device: Rolling [place] (2 wheeled) Gait Pattern/deviations: Step-to pattern  Pt demonstrated slight impulsive behavior, but responds well to verbal cues to slow down. Pt also required verbal cues to report fatigue and SOB, indicating tendency to ignore potential signs of knee collapse.        Stairs            Wheelchair Mobility    Modified Rankin (Stroke Patients Only)       Balance Overall balance assessment: Modified Independent Sitting-balance  support: No upper extremity supported;Feet supported Sitting balance-Leahy Scale: Good     Standing balance support: Bilateral upper extremity supported Standing balance-Leahy Scale: Poor                      Cognition Arousal/Alertness: Awake/alert Behavior During Therapy: WFL for tasks assessed/performed Overall Cognitive Status: Within Functional Limits for tasks assessed                      Exercises Total Joint Exercises Ankle Circles/Pumps: AROM;Both;10 reps;Supine Quad Sets: AROM;10 reps;Supine;Both Heel Slides: AROM;Right;Supine;5 reps (2x5; painful)    General Comments        Pertinent Vitals/Pain Pain Assessment: 0-10 Pain Score: 7  Pain Location: R hip Pain Descriptors / Indicators: Aching Pain Intervention(s): Monitored during session;Relaxation           SpO2: 88 to 92% during ambulation.  Checked due to dyspnea on exertion 2/4 when patient breathing correctly, stats were greater than 90%.  Home Living                      Prior Function            PT Goals (current goals can now be found in the care plan section) Acute Rehab PT Goals Patient Stated Goal: Home PT Goal Formulation: With patient Time For Goal Achievement: 07/09/16 Potential to Achieve Goals: Good Progress towards PT goals: Progressing toward goals    Frequency    7X/week      PT Plan  Current plan remains appropriate    Co-evaluation             End of Session Equipment Utilized During Treatment: Gait belt Activity Tolerance: Patient tolerated treatment well Patient left: in bed;with call bell/phone within reach;with family/visitor present     Time: 0921-0942 PT Time Calculation (min) (ACUTE ONLY): 21 min  Charges:  $Gait Training: 8-22 mins                    G Codes:      Tonia Brooms Aug 01, 2016, 10:52 AM Tonia Brooms, SPT [phone]

## 2016-07-03 NOTE — Discharge Instructions (Signed)

## 2016-07-03 NOTE — Progress Notes (Signed)
Inpatient Diabetes Program Recommendations  AACE/ADA: New Consensus Statement on Inpatient Glycemic Control (2015)  Target Ranges:  Prepandial:   less than 140 mg/dL      Peak postprandial:   less than 180 mg/dL (1-2 hours)      Critically ill patients:  140 - 180 mg/dL   Lab Results  Component Value Date   GLUCAP 188 (H) 07/03/2016   HGBA1C 8.0 (H) 05/20/2016    Review of Glycemic Control:  Results for [name], [name] (MRN [id number]) as of 07/03/2016 09:32  Ref. Range 07/02/2016 08:31 07/02/2016 12:30 07/02/2016 17:05 07/02/2016 22:15 07/03/2016 07:12  Glucose-Capillary Latest Ref Range: 65 - 99 mg/dL 178 (H) 188 (H) 192 (H) 182 (H) 188 (H)   Inpatient Diabetes Program Recommendations:    Note history of diabetes.  Please consider adding Novolog moderate correction tid with meals and HS.  Thanks, Randy Perl, RN, BC-ADM Inpatient Diabetes Coordinator Pager [phone] (8a-5p)

## 2016-07-03 NOTE — Progress Notes (Signed)
Physical Therapy Treatment Patient Details Name: Randy Hebert MRN: [id number] DOB: Jun 06, 1966 Today's Date: 07/03/2016    History of Present Illness pt presents post R THR anterior approach.  pt with hx of DM, COPD, HF, CAD, MI, Anxiety, and Depression.    PT Comments    Pt demonstrated ability to safely navigate stairs with min guarding. Pt performed standing interventions with moderate fatigue, and required a sitting break in order to manage SOB.   Follow Up Recommendations  Home health PT;Supervision for mobility/OOB     Equipment Recommendations  Rolling [place] with 5" wheels    Recommendations for Other Services       Precautions / Restrictions Precautions Precautions: Fall Restrictions Weight Bearing Restrictions: Yes RLE Weight Bearing: Weight bearing as tolerated    Mobility  Bed Mobility Overal bed mobility: Needs Assistance             General bed mobility comments: Pt sitting in chair with girlfriend supervising on PT arrival  Transfers Overall transfer level: Needs assistance Equipment used: Rolling [place] (2 wheeled) Transfers: Sit to/from Stand Sit to Stand: Supervision         General transfer comment: pt required little to no cues for safe transfers  Ambulation/Gait Ambulation/Gait assistance: Min guard Ambulation Distance (Feet): 15 Feet Assistive device: Rolling [place] (2 wheeled) Gait Pattern/deviations: Step-to pattern;Decreased stance time - right;Decreased step length - left     General Gait Details:  (pt ambulated to recliner)   Stairs Stairs: Yes Stairs assistance: Min guard Stair Management: One rail Left (pt has rails on both sides available) Number of Stairs: 3 General stair comments: Pt demonstrated understanding of stair navigation instructions; pt required min guard with ascending and descending stairs  Pt was taught to navigate stairs with RW as well as with use of rail. Pt was given handout for navigation of stairs  with RW. Girlfriend was educated on stair safety.  Wheelchair Mobility    Modified Rankin (Stroke Patients Only)       Balance Overall balance assessment: Needs assistance Sitting-balance support: No upper extremity supported;Feet supported Sitting balance-Leahy Scale: Good     Standing balance support: Single extremity supported;During functional activity Standing balance-Leahy Scale: Poor Standing balance comment: Able to navigate stairs with one hand UE assist                    Cognition Arousal/Alertness: Awake/alert Behavior During Therapy: WFL for tasks assessed/performed Overall Cognitive Status: Within Functional Limits for tasks assessed                      Exercises Total Joint Exercises Hip ABduction/ADduction: AROM;Both;10 reps;Standing Long Arc Quad: AROM;Right;10 reps;Seated Marching in Standing: AROM;Both;10 reps;Standing    General Comments        Pertinent Vitals/Pain Pain Assessment: 0-10 Pain Score: 6  Faces Pain Scale: Hurts little more Pain Location: R hip Pain Descriptors / Indicators: Aching Pain Intervention(s): Monitored during session    Home Living                      Prior Function            PT Goals (current goals can now be found in the care plan section) Acute Rehab PT Goals Patient Stated Goal: Home Progress towards PT goals: Progressing toward goals    Frequency    7X/week      PT Plan Current plan remains appropriate    Co-evaluation  End of Session Equipment Utilized During Treatment: Gait belt Activity Tolerance: Patient tolerated treatment well Patient left: in bed;with call bell/phone within reach;with family/visitor present     Time: 2800-3491 PT Time Calculation (min) (ACUTE ONLY): 31 min  Charges:  $Gait Training: 8-22 mins $Therapeutic Exercise: 8-22 mins                    G Codes:      Tonia Brooms 2016/07/18, 3:06 PM  Tonia Brooms, SPT [phone]

## 2016-07-03 NOTE — Progress Notes (Signed)
Subjective: 2 Days Post-Op Procedure(s) (LRB): RIGHT TOTAL HIP ARTHROPLASTY ANTERIOR APPROACH (Right) Patient reports pain as moderate.  Feels better today overall.  Objective: Vital signs in last 24 hours: Temp:  [98.5 F (36.9 C)-98.6 F (37 C)] 98.6 F (37 C) (11/02 0538) Pulse Rate:  [78-93] 78 (11/02 0538) Resp:  [18-19] 18 (11/02 0538) BP: (115-133)/(54-71) 120/68 (11/02 0538) SpO2:  [91 %-98 %] 93 % (11/02 0726)  Intake/Output from previous day: 11/01 0701 - 11/02 0700 In: 2453.8 [P.O.:720; I.V.:1733.8] Out: 950 [Urine:950] Intake/Output this shift: No intake/output data recorded.   Recent Labs  07/02/16 0507  HGB 12.8*    Recent Labs  07/02/16 0507  WBC 11.7*  RBC 4.65  HCT 39.2  PLT 237    Recent Labs  07/02/16 0507  NA 134*  K 3.8  CL 100*  CO2 26  BUN 14  CREATININE 1.09  GLUCOSE 193*  CALCIUM 8.1*   No results for input(s): LABPT, INR in the last 72 hours.  Sensation intact distally Intact pulses distally Dorsiflexion/Plantar flexion intact Incision: scant drainage Compartment soft  Assessment/Plan: 2 Days Post-Op Procedure(s) (LRB): RIGHT TOTAL HIP ARTHROPLASTY ANTERIOR APPROACH (Right) Up with therapy Plan for discharge tomorrow Discharge home with home health  Mcarthur Rossetti 07/03/2016, 8:05 AM

## 2016-07-04 LAB — GLUCOSE, CAPILLARY
Glucose-Capillary: 146 mg/dL — ABNORMAL HIGH (ref 65–99)
Glucose-Capillary: 159 mg/dL — ABNORMAL HIGH (ref 65–99)

## 2016-07-04 NOTE — Progress Notes (Signed)
Patient ID: Randy Hebert, male   DOB: 03/22/1966, 50 y.o.   MRN: [id number] Doing well.  Can be discharged to home today.

## 2016-07-04 NOTE — Progress Notes (Signed)
Pt discharged home in stable condition after going over discharge instructions with no concerns voiced. Scripts, AVS and DME Federici provided before leaving the unit

## 2016-07-04 NOTE — Progress Notes (Signed)
Physical Therapy Treatment Patient Details Name: Randy Hebert MRN: [id number] DOB: Aug 29, 1966 Today's Date: 07/04/2016    History of Present Illness pt presents post R THR anterior approach.  pt with hx of DM, COPD, HF, CAD, MI, Anxiety, and Depression.    PT Comments    Pt demonstrated increased endurance ability as indicated by pt's tolerance for standing interventions without rest break. Pt reported increased pain following HEP; therefore pt requested to remain seated at end of session. Pt has met goal of modified independence with ambulation, but did not meet distance goal at this time. Pt is a good candidate for home health PT.   Follow Up Recommendations  Home health PT;Supervision for mobility/OOB     Equipment Recommendations  Rolling Russell with 5" wheels    Recommendations for Other Services       Precautions / Restrictions Precautions Precautions: Fall Restrictions Weight Bearing Restrictions: Yes RLE Weight Bearing: Weight bearing as tolerated    Mobility  Bed Mobility                  Transfers Overall transfer level: Modified independent Equipment used: Rolling Test (2 wheeled) Transfers: Sit to/from Stand Sit to Stand: Supervision         General transfer comment: pt required little to no cues for safe transfers  Ambulation/Gait                 Stairs            Wheelchair Mobility    Modified Rankin (Stroke Patients Only)       Balance Overall balance assessment: Modified Independent Sitting-balance support: No upper extremity supported;Feet supported Sitting balance-Leahy Scale: Good     Standing balance support: Bilateral upper extremity supported Standing balance-Leahy Scale: Poor Standing balance comment: Pt able to maintain standing balance with standing interventions in todays session                    Cognition Arousal/Alertness: Awake/alert Behavior During Therapy: [facility] for tasks  assessed/performed Overall Cognitive Status: Within Functional Limits for tasks assessed                      Exercises Total Joint Exercises Hip ABduction/ADduction: AROM;Right;10 reps;Standing Knee Flexion: AROM;Right;10 reps;Standing Standing Hip Extension: AROM;Right;10 reps;Standing    General Comments        Pertinent Vitals/Pain Pain Assessment: 0-10 Pain Score: 8  Pain Location: R hip Pain Descriptors / Indicators: Aching Pain Intervention(s): Monitored during session    Home Living                      Prior Function            PT Goals (current goals can now be found in the care plan section) Progress towards PT goals: Progressing toward goals    Frequency    7X/week      PT Plan Current plan remains appropriate    Co-evaluation             End of Session Equipment Utilized During Treatment: Gait belt Activity Tolerance: Patient tolerated treatment well Patient left: with call bell/phone within reach;with family/visitor present;in chair     Time: 1937-9024 PT Time Calculation (min) (ACUTE ONLY): 12 min  Charges:  $Therapeutic Exercise: 8-22 mins                    G Codes:      Wells Guiles  Rylann Munford 07/04/2016, 11:10 AM  Tonia Brooms, SPT [phone]

## 2016-07-04 NOTE — Discharge Summary (Signed)
Patient ID: Randy Hebert MRN: [id number] DOB/AGE: 1966-08-27 50 y.o.  Admit date: 07/01/2016 Discharge date: 07/04/2016  Admission Diagnoses:  Principal Problem:   Unilateral primary osteoarthritis, right hip Active Problems:   Status post total replacement of right hip   Discharge Diagnoses:  Same  Past Medical History:  Diagnosis Date  . Anxiety   . Arthritis   . Back pain   . Bulging of cervical intervertebral disc   . CAD (coronary artery disease)    lateral STEMI 02/06/2015 00% D1 occlusion treated with Promus Premier 2.5 mm x 16 mm DES, 70% ramus stenosis, 40% mid RCA stenosis, 45% distal RCA stenosis, EF 45-50%  . CHF (congestive heart failure) ([place])   . COPD (chronic obstructive pulmonary disease) ([place])   . Depression   . Diabetes mellitus without complication ([place])   . Dyspnea   . GERD (gastroesophageal reflux disease)   . Headache   . Hip pain   . MI (myocardial infarction)   . Neck pain   . Otitis media   . Pleurisy   . Sinus pause    9 sec sinus pause on telemetry after started on coreg after MI, avoid AV nodal blocking agent    Surgeries: Procedure(s): RIGHT TOTAL HIP ARTHROPLASTY ANTERIOR APPROACH on 07/01/2016   Consultants:   Discharged Condition: Improved  Hospital Course: Randy Hebert is an 50 y.o. male who was admitted 07/01/2016 for operative treatment ofUnilateral primary osteoarthritis, right hip. Patient has severe unremitting pain that affects sleep, daily activities, and work/hobbies. After pre-op clearance the patient was taken to the operating room on 07/01/2016 and underwent  Procedure(s): RIGHT TOTAL HIP ARTHROPLASTY ANTERIOR APPROACH.    Patient was given perioperative antibiotics: Anti-infectives    Start     Dose/Rate Route Frequency Ordered Stop   07/01/16 2145  vancomycin (VANCOCIN) IVPB 1000 mg/200 mL premix     1,000 mg 200 mL/hr over 60 Minutes Intravenous Every 12 hours 07/01/16 2130 07/01/16 2345   07/01/16 1300   clindamycin (CLEOCIN) IVPB 900 mg     900 mg 100 mL/hr over 30 Minutes Intravenous To [facility] 06/30/16 1254 07/01/16 1621       Patient was given sequential compression devices, early ambulation, and chemoprophylaxis to prevent DVT.  Patient benefited maximally from hospital stay and there were no complications.    Recent vital signs: Patient Vitals for the past 24 hrs:  BP Temp Temp src Pulse Resp SpO2  07/04/16 0532 129/80 98.1 F (36.7 C) - 93 18 94 %  07/03/16 2152 129/60 98.3 F (36.8 C) Oral 74 17 92 %  07/03/16 1952 - - - - - 91 %  07/03/16 1300 114/65 98.2 F (36.8 C) Oral 98 - 90 %  07/03/16 0726 - - - - - 93 %     Recent laboratory studies:  Recent Labs  07/02/16 0507  WBC 11.7*  HGB 12.8*  HCT 39.2  PLT 237  NA 134*  K 3.8  CL 100*  CO2 26  BUN 14  CREATININE 1.09  GLUCOSE 193*  CALCIUM 8.1*     Discharge Medications:     Medication List    TAKE these medications   albuterol 108 (90 Base) MCG/ACT inhaler Commonly known as:  PROVENTIL HFA;VENTOLIN HFA Inhale 2 puffs into the lungs every 6 (six) hours as needed for wheezing or shortness of breath.   albuterol (2.5 MG/3ML) 0.083% nebulizer solution Commonly known as:  PROVENTIL Take 3 mLs (2.5 mg total) by  nebulization every 6 (six) hours as needed for wheezing or shortness of breath.   amLODipine 10 MG tablet Commonly known as:  NORVASC Take 1 tablet (10 mg total) by mouth daily.   aspirin EC 81 MG tablet Take 1 tablet (81 mg total) by mouth 2 (two) times daily after a meal. What changed:  when to take this   atorvastatin 20 MG tablet Commonly known as:  LIPITOR Take 20 mg by mouth daily.   BENGAY EX Apply 1 application topically at bedtime.   citalopram 10 MG tablet Commonly known as:  CELEXA Take 10 mg by mouth daily.   cycloSPORINE 0.05 % ophthalmic emulsion Commonly known as:  RESTASIS Place 1 drop into both eyes 2 (two) times daily.   FISH OIL PO Take 1,200 mg by  mouth 2 (two) times daily.   ibuprofen 400 MG tablet Commonly known as:  ADVIL,MOTRIN Take 1 tablet (400 mg total) by mouth 3 (three) times daily.   isosorbide mononitrate 30 MG 24 hr tablet Commonly known as:  IMDUR Take 0.5 tablets (15 mg total) by mouth daily.   metFORMIN 1000 MG tablet Commonly known as:  GLUCOPHAGE Take 1 tablet (1,000 mg total) by mouth 2 (two) times daily with a meal.   mometasone-formoterol 100-5 MCG/ACT Aero Commonly known as:  DULERA Inhale 2 puffs into the lungs 2 (two) times daily.   nitroGLYCERIN 0.4 MG SL tablet Commonly known as:  NITROSTAT Place 1 tablet (0.4 mg total) under the tongue every 5 (five) minutes as needed for chest pain.   oxyCODONE-acetaminophen 5-325 MG tablet Commonly known as:  ROXICET Take 1-2 tablets by mouth every 4 (four) hours as needed.   oxymetazoline 0.05 % nasal spray Commonly known as:  AFRIN Place 1 spray into both nostrils 2 (two) times daily as needed for congestion.   PAZEO 0.7 % Soln Generic drug:  Olopatadine HCl Apply 1 drop to eye daily.   predniSONE 20 MG tablet Commonly known as:  DELTASONE Take 2 tablets (40 mg total) by mouth daily with breakfast.   REFRESH OPTIVE ADVANCED 0.5-1-0.5 % Soln Generic drug:  Carboxymeth-Glycerin-Polysorb Apply 1 drop to eye daily as needed (dry eyes).   traZODone 100 MG tablet Commonly known as:  DESYREL Take 100 mg by mouth at bedtime.       Diagnostic Studies: Dg C-arm 1-60 Min  Result Date: 07/01/2016 CLINICAL DATA:  Right hip arthroplasty EXAM: OPERATIVE RIGHT HIP WITH PELVIS; DG C-ARM 61-120 MIN COMPARISON:  04/03/2016 FLUOROSCOPY TIME:  Radiation Exposure Index (as provided by the fluoroscopic device): Not available If the device does not provide the exposure index: Fluoroscopy Time:  34 seconds Number of Acquired Images:  2 FINDINGS: A right hip prosthesis is now seen in satisfactory position. No acute soft tissue or bony abnormality is noted. IMPRESSION:  Status post right hip replacement Electronically Signed   By: Inez Catalina M.D.   On: 07/01/2016 17:35   Dg Hip Port Unilat With Pelvis 1v Right  Result Date: 07/01/2016 CLINICAL DATA:  Postoperative evaluation status post right hip arthroplasty. EXAM: DG HIP (WITH OR WITHOUT PELVIS) 1V PORT RIGHT COMPARISON:  Prior intraoperative fluoroscopic views performed earlier the same day. FINDINGS: A right hip arthroplasty is in place. Acetabular and femoral components appear well-seated. No periprosthetic fracture or other complication. Postoperative soft tissue swelling with skin staples in place about the right hip. Bony pelvis intact. No acute abnormality about the left hip. Degenerative osteoarthritic changes noted at the left hip. IMPRESSION:  Postoperative changes from recent right total hip arthroplasty without complication. Electronically Signed   By: Jeannine Boga M.D.   On: 07/01/2016 20:09   Dg Hip Operative Unilat W Or W/o Pelvis Right  Result Date: 07/01/2016 CLINICAL DATA:  Right hip arthroplasty EXAM: OPERATIVE RIGHT HIP WITH PELVIS; DG C-ARM 61-120 MIN COMPARISON:  04/03/2016 FLUOROSCOPY TIME:  Radiation Exposure Index (as provided by the fluoroscopic device): Not available If the device does not provide the exposure index: Fluoroscopy Time:  34 seconds Number of Acquired Images:  2 FINDINGS: A right hip prosthesis is now seen in satisfactory position. No acute soft tissue or bony abnormality is noted. IMPRESSION: Status post right hip replacement Electronically Signed   By: Inez Catalina M.D.   On: 07/01/2016 17:35    Disposition: 01-Home or Self Care  Discharge Instructions    Call MD / Call 911    Complete by:  As directed    If you experience chest pain or shortness of breath, CALL 911 and be transported to the hospital emergency room.  If you develope a fever above 101 F, pus (white drainage) or increased drainage or redness at the wound, or calf pain, call your surgeon's office.    Constipation Prevention    Complete by:  As directed    Drink plenty of fluids.  Prune juice may be helpful.  You may use a stool softener, such as Colace (over the counter) 100 mg twice a day.  Use MiraLax (over the counter) for constipation as needed.   Diet - low sodium heart healthy    Complete by:  As directed    Discharge patient    Complete by:  As directed    Increase activity slowly as tolerated    Complete by:  As directed       Follow-up Information    Mcarthur Rossetti, MD Follow up in 2 week(s).   Specialty:  Orthopedic Surgery Contact information: [address] [place] [address] [phone]            Signed: Mcarthur Rossetti 07/04/2016, 7:00 AM

## 2016-07-04 NOTE — Progress Notes (Signed)
Physical Therapy Discharge Patient Details Name: Randy Hebert MRN: [id number] DOB: 05-31-1966 Today's Date: 07/04/2016 Time: 6840-3353 PT Time Calculation (min) (ACUTE ONLY): 12 min  Patient discharged from PT services secondary to goals met and no further PT needs identified.  Please see latest therapy progress note for current level of functioning and progress toward goals.    Progress and discharge plan discussed with patient and/or caregiver: Patient/Caregiver agrees with plan  GP     [name], SPT [phone]   07/04/2016, 10:14 AM

## 2016-07-07 ENCOUNTER — Telehealth (INDEPENDENT_AMBULATORY_CARE_PROVIDER_SITE_OTHER): Payer: Self-pay | Admitting: *Deleted

## 2016-07-07 NOTE — Telephone Encounter (Signed)
Please advise 

## 2016-07-07 NOTE — Telephone Encounter (Signed)
Patient called stating he needed clarification for his stocking. He states he hasn't been fitted for the stocking and stating his medicare needs an order for this and for a shower bench. Patient requesting call back at.  [phone]

## 2016-07-07 NOTE — Telephone Encounter (Signed)
He needs a shower bench and [employer].  See if his home health can provide these.

## 2016-07-08 NOTE — Telephone Encounter (Signed)
Per [facility] they state "both of those are not covered by Medicare or insurances, those would have to be purchased out of pocket by the patient. I've seen TED hose and shower benches at [place] and most drug stores. Patient would have to have someone purchase them and bring them to him. Our therapist will work with him on shower transfers and can help him get TED hose on too" Patient aware of this message

## 2016-07-15 ENCOUNTER — Ambulatory Visit (INDEPENDENT_AMBULATORY_CARE_PROVIDER_SITE_OTHER): Payer: Medicaid Other | Admitting: Orthopaedic Surgery

## 2016-07-15 DIAGNOSIS — Z96641 Presence of right artificial hip joint: Secondary | ICD-10-CM

## 2016-07-15 MED ORDER — OXYCODONE-ACETAMINOPHEN 10-325 MG PO TABS: 1.0000 | ORAL_TABLET | Freq: Four times a day (QID) | ORAL | 0 refills | Status: DC | PRN

## 2016-07-15 NOTE — Progress Notes (Signed)
Randy Hebert is 2 weeks status post a right total hip arthroplasty through direct anterior approach. Exam laying with a Cobb and says he's doing well.  On examination of his right hip incision the staple lines intact remove staples and placed Steri-Strips. He does have a moderate seroma. Using 18-gauge needle was able to aspirate 60 mL of the seroma fluid and place a Band-Aid over this. His leg lengths are equal he tolerated [place] puddings at the range of motion as well.  He'll continue increase his activities and did refill his Percocet. I'll see him back in 4 weeks to see how is doing overall but no x-rays are needed

## 2016-07-21 NOTE — Anesthesia Postprocedure Evaluation (Addendum)
Anesthesia Post Note  Patient: Randy Hebert  Procedure(s) Performed: Procedure(s) (LRB): RIGHT TOTAL HIP ARTHROPLASTY ANTERIOR APPROACH (Right)  Patient location during evaluation: PACU Anesthesia Type: General Level of consciousness: awake and alert Pain management: pain level controlled Vital Signs Assessment: post-procedure vital signs reviewed and stable Respiratory status: spontaneous breathing, nonlabored ventilation, respiratory function stable and patient connected to nasal cannula oxygen Cardiovascular status: blood pressure returned to baseline and stable Postop Assessment: no signs of nausea or vomiting Anesthetic complications: no    Last Vitals:  Vitals:   07/03/16 2152 07/04/16 0532  BP: 129/60 129/80  Pulse: 74 93  Resp: 17 18  Temp: 36.8 C 36.7 C    Last Pain:  Vitals:   07/04/16 1311  TempSrc:   PainSc: 8                  [name] Minda Meo

## 2016-07-26 ENCOUNTER — Encounter (HOSPITAL_COMMUNITY): Payer: Self-pay | Admitting: Emergency Medicine

## 2016-07-26 ENCOUNTER — Emergency Department (HOSPITAL_COMMUNITY)
Admission: EM | Admit: 2016-07-26 | Discharge: 2016-07-26 | Disposition: A | Discharge status: Home / Self Care | Payer: Medicaid Other | Attending: Emergency Medicine | Admitting: Emergency Medicine

## 2016-07-26 DIAGNOSIS — Z7984 Long term (current) use of oral hypoglycemic drugs: Secondary | ICD-10-CM | POA: Insufficient documentation

## 2016-07-26 DIAGNOSIS — I110 Hypertensive heart disease with heart failure: Secondary | ICD-10-CM | POA: Diagnosis not present

## 2016-07-26 DIAGNOSIS — F17210 Nicotine dependence, cigarettes, uncomplicated: Secondary | ICD-10-CM | POA: Diagnosis not present

## 2016-07-26 DIAGNOSIS — E119 Type 2 diabetes mellitus without complications: Secondary | ICD-10-CM | POA: Insufficient documentation

## 2016-07-26 DIAGNOSIS — K0889 Other specified disorders of teeth and supporting structures: Secondary | ICD-10-CM | POA: Insufficient documentation

## 2016-07-26 DIAGNOSIS — I2510 Atherosclerotic heart disease of native coronary artery without angina pectoris: Secondary | ICD-10-CM | POA: Diagnosis not present

## 2016-07-26 DIAGNOSIS — Z79899 Other long term (current) drug therapy: Secondary | ICD-10-CM | POA: Insufficient documentation

## 2016-07-26 DIAGNOSIS — I509 Heart failure, unspecified: Secondary | ICD-10-CM | POA: Insufficient documentation

## 2016-07-26 DIAGNOSIS — Z7982 Long term (current) use of aspirin: Secondary | ICD-10-CM | POA: Diagnosis not present

## 2016-07-26 MED ORDER — CLINDAMYCIN HCL 150 MG PO CAPS
300.0000 mg | ORAL_CAPSULE | Freq: Once | ORAL | Status: AC
  Administered 2016-07-26: 300 mg via ORAL
  Filled 2016-07-26: qty 2

## 2016-07-26 MED ORDER — CLINDAMYCIN HCL 150 MG PO CAPS: ORAL_CAPSULE | ORAL | 0 refills | Status: DC

## 2016-07-26 MED ORDER — IBUPROFEN 800 MG PO TABS
800.0000 mg | ORAL_TABLET | Freq: Once | ORAL | Status: AC
  Administered 2016-07-26: 800 mg via ORAL
  Filled 2016-07-26: qty 1

## 2016-07-26 NOTE — Discharge Instructions (Signed)
Please use to clindamycin tablets with breakfast, lunch, and dinner. Please see your dentist on Monday as previously planned. Monitor closely for any temperature changes.

## 2016-07-26 NOTE — ED Triage Notes (Signed)
Pt reports left sided dental pain x1-2 weeks, also has pain in throat and left ear.  Pt alert and oriented.

## 2016-07-26 NOTE — ED Provider Notes (Signed)
[facility] DEPT Provider Note   CSN: [id number] Arrival date & time: 07/26/16  0865     History   Chief Complaint Chief Complaint  Patient presents with  . Dental Pain    HPI Randy Hebert is a 50 y.o. male.  Patient is a 50 year old male who presents to the emergency department with a complaint of dental pain.  The patient states that in October he had a tooth removed. He states that he later felt that a piece of the tooth had worked its way through the gum. It came out by its own. He states that now he feels that another piece is coming out and it feels quite large and it has some swelling involving the gum. The patient is very concerned because he recently had hip surgery done and is concerned about infection. His been no fever or chills reported. The patient states that the piece of the tooth is scratching his tongue, but no other problems. No problems with swallowing or breathing.   The history is provided by the patient.  Dental Pain      Past Medical History:  Diagnosis Date  . Anxiety   . Arthritis   . Back pain   . Bulging of cervical intervertebral disc   . CAD (coronary artery disease)    lateral STEMI 02/06/2015 00% D1 occlusion treated with Promus Premier 2.5 mm x 16 mm DES, 70% ramus stenosis, 40% mid RCA stenosis, 45% distal RCA stenosis, EF 45-50%  . CHF (congestive heart failure) ([place])   . COPD (chronic obstructive pulmonary disease) ([place])   . Depression   . Diabetes mellitus without complication ([place])   . Dyspnea   . GERD (gastroesophageal reflux disease)   . Headache   . Hip pain   . MI (myocardial infarction)   . Neck pain   . Otitis media   . Pleurisy   . Sinus pause    9 sec sinus pause on telemetry after started on coreg after MI, avoid AV nodal blocking agent    Patient Active Problem List   Diagnosis Date Noted  . Unilateral primary osteoarthritis, right hip 07/01/2016  . 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  Type 2 diabetes mellitus without complication, without long-term current use of insulin ([place]) 04/26/2016  . Right hip pain 12/19/2015  . Chest pain 12/10/2015  . Chronic obstructive pulmonary disease ([place]) 08/27/2015  . Cigarette nicotine dependence with nicotine-induced disorder 08/27/2015  . Hyperlipidemia 08/27/2015  . Obesity, unspecified 08/27/2015  . Dyspnea 07/11/2015  . Acute respiratory failure with hypoxia ([place]) 06/02/2015  . Acute respiratory failure with hypoxemia ([place]) 06/02/2015  . Acute on chronic systolic heart failure ([place]) 06/02/2015  . ACE inhibitor-aggravated angioedema   . Chronic systolic CHF (congestive heart failure) ([place]) 06/01/2015  . CAD (coronary artery disease) 05/31/2015  . STEMI (ST elevation myocardial infarction) ([place]) 05/31/2015  . Tobacco use disorder 05/31/2015  . Angioedema 05/30/2015  . Sinus pause secondary to beta blocker 02/09/2015  . Essential hypertension 02/09/2015  . ST elevation myocardial infarction (STEMI) of lateral wall, initial episode of care [facility]) 02/06/2015    Class: Hospitalized for    Past Surgical History:  Procedure Laterality Date  . CARDIAC CATHETERIZATION N/A 02/06/2015   Procedure: Left Heart Cath and Coronary Angiography;  Surgeon: Leonie Man, MD;  Location: [facility] CV LAB;  Service: Cardiovascular;  Laterality: N/A;  . CARDIAC CATHETERIZATION N/A 02/06/2015   Procedure: Coronary Stent Intervention;  Surgeon: Shanon Brow  Loren Racer, MD;  Location: [facility];  Service: Cardiovascular;  Laterality: N/A;  . CORONARY ANGIOPLASTY WITH STENT PLACEMENT    . INCISION / DRAINAGE HAND / FINGER    . TOTAL HIP ARTHROPLASTY Right 07/01/2016  . TOTAL HIP ARTHROPLASTY Right 07/01/2016   Procedure: RIGHT TOTAL HIP ARTHROPLASTY ANTERIOR APPROACH;  Surgeon: Mcarthur Rossetti, MD;  Location: [facility];  Service: Orthopedics;  Laterality: Right;       Home Medications    Prior to Admission medications   Medication Sig Start  Date End Date Taking? Authorizing Provider  albuterol (PROVENTIL HFA;VENTOLIN HFA) 108 (90 Base) MCG/ACT inhaler Inhale 2 puffs into the lungs every 6 (six) hours as needed for wheezing or shortness of breath. 04/24/16   Soyla Dryer, PA-C  albuterol (PROVENTIL) (2.5 MG/3ML) 0.083% nebulizer solution Take 3 mLs (2.5 mg total) by nebulization every 6 (six) hours as needed for wheezing or shortness of breath. 05/27/16   Soyla Dryer, PA-C  amLODipine (NORVASC) 10 MG tablet Take 1 tablet (10 mg total) by mouth daily. 05/27/16   Soyla Dryer, PA-C  aspirin EC 81 MG tablet Take 1 tablet (81 mg total) by mouth 2 (two) times daily after a meal. 07/03/16   Mcarthur Rossetti, MD  atorvastatin (LIPITOR) 20 MG tablet Take 20 mg by mouth daily. 09/27/15   Historical Provider, MD  Carboxymeth-Glycerin-Polysorb (REFRESH OPTIVE ADVANCED) 0.5-1-0.5 % SOLN Apply 1 drop to eye daily as needed (dry eyes).     Historical Provider, MD  citalopram (CELEXA) 10 MG tablet Take 10 mg by mouth daily.    Historical Provider, MD  cycloSPORINE (RESTASIS) 0.05 % ophthalmic emulsion Place 1 drop into both eyes 2 (two) times daily.    Historical Provider, MD  ibuprofen (ADVIL,MOTRIN) 400 MG tablet Take 1 tablet (400 mg total) by mouth 3 (three) times daily. 04/03/16   Ezequiel Essex, MD  isosorbide mononitrate (IMDUR) 30 MG 24 hr tablet Take 0.5 tablets (15 mg total) by mouth daily. 08/03/15   Arnoldo Lenis, MD  Menthol, Topical Analgesic, (BENGAY EX) Apply 1 application topically at bedtime.     Historical Provider, MD  metFORMIN (GLUCOPHAGE) 1000 MG tablet Take 1 tablet (1,000 mg total) by mouth 2 (two) times daily with a meal. 05/27/16   Soyla Dryer, PA-C  mometasone-formoterol (DULERA) 100-5 MCG/ACT AERO Inhale 2 puffs into the lungs 2 (two) times daily. 04/24/16   Soyla Dryer, PA-C  nitroGLYCERIN (NITROSTAT) 0.4 MG SL tablet Place 1 tablet (0.4 mg total) under the tongue every 5 (five) minutes as needed for chest  pain. 02/09/15   Almyra Deforest, PA  Olopatadine HCl (PAZEO) 0.7 % SOLN Apply 1 drop to eye daily.    Historical Provider, MD  Omega-3 Fatty Acids (FISH OIL PO) Take 1,200 mg by mouth 2 (two) times daily.    Historical Provider, MD  oxyCODONE-acetaminophen (PERCOCET) 10-325 MG tablet Take 1 tablet by mouth every 6 (six) hours as needed for pain. 07/15/16   Mcarthur Rossetti, MD  oxymetazoline (AFRIN) 0.05 % nasal spray Place 1 spray into both nostrils 2 (two) times daily as needed for congestion.    Historical Provider, MD  predniSONE (DELTASONE) 20 MG tablet Take 2 tablets (40 mg total) by mouth daily with breakfast. Patient not taking: Reported on 07/15/2016 06/05/16   Arnoldo Lenis, MD  traZODone (DESYREL) 100 MG tablet Take 100 mg by mouth at bedtime.    Historical Provider, MD    Family History Family History  Problem Relation Age of Onset  . Heart attack Father   . Stroke Father     Social History Social History  Substance Use Topics  . Smoking status: Current Some Day Smoker    Packs/day: 0.50    Years: 46.00    Types: Cigarettes    Start date: 06/07/1979  . Smokeless tobacco: Former Systems developer    Types: Chew  . Alcohol use No     Comment: 2 glasses of wine yesterday     Allergies   Carvedilol; Lisinopril; and Amoxicillin   Review of Systems Review of Systems  Constitutional: Negative for activity change.       All ROS Neg except as noted in HPI  HENT: Positive for dental problem. Negative for nosebleeds.   Eyes: Negative for photophobia and discharge.  Respiratory: Negative for cough, shortness of breath and wheezing.   Cardiovascular: Negative for chest pain and palpitations.  Gastrointestinal: Negative for abdominal pain and blood in stool.  Genitourinary: Negative for dysuria, frequency and hematuria.  Musculoskeletal: Positive for arthralgias and back pain. Negative for neck pain.  Skin: Negative.   Neurological: Negative for dizziness, seizures and speech  difficulty.  Psychiatric/Behavioral: Negative for confusion and hallucinations. The patient is nervous/anxious.      Physical Exam Updated Vital Signs BP 160/91 (BP Location: Left Arm)   Pulse 79   Temp 98.2 F (36.8 C) (Oral)   Resp 18   Ht '5\' 5"'$  (1.651 m)   Wt 99.8 kg   SpO2 92%   BMI 36.61 kg/m   Physical Exam  Constitutional: He is oriented to person, place, and time. He appears well-developed and well-nourished.  Non-toxic appearance.  HENT:  Head: Normocephalic.  Right Ear: Tympanic membrane and external ear normal.  Left Ear: Tympanic membrane and external ear normal.  There is swelling of the gum in the left lower molar area. There is a protrusion of tissue that is per referring, possibly consistent with residual root. The airway is patent. There is no swelling under the tongue.  Eyes: EOM and lids are normal. Pupils are equal, round, and reactive to light.  Neck: Normal range of motion. Neck supple. Carotid bruit is not present.  Cardiovascular: Normal rate, regular rhythm, normal heart sounds, intact distal pulses and normal pulses.   Pulmonary/Chest: Breath sounds normal. No respiratory distress.  Abdominal: Soft. Bowel sounds are normal. There is no tenderness. There is no guarding.  Musculoskeletal: Normal range of motion.  Lymphadenopathy:       Head (right side): No submandibular adenopathy present.       Head (left side): No submandibular adenopathy present.    He has no cervical adenopathy.  Neurological: He is alert and oriented to person, place, and time. He has normal strength. No cranial nerve deficit or sensory deficit.  Skin: Skin is warm and dry.  Psychiatric: He has a normal mood and affect. His speech is normal.  Nursing note and vitals reviewed.    ED Treatments / Results  Labs (all labs ordered are listed, but only abnormal results are displayed) Labs Reviewed - No data to display  EKG  EKG Interpretation None       Radiology No  results found.  Procedures Procedures (including critical care time)  Medications Ordered in ED Medications  clindamycin (CLEOCIN) capsule 300 mg (not administered)  ibuprofen (ADVIL,MOTRIN) tablet 800 mg (not administered)     Initial Impression / Assessment and Plan / ED Course  I have reviewed the triage vital  signs and the nursing notes.  Pertinent labs & imaging results that were available during my care of the patient were reviewed by me and considered in my medical decision making (see chart for details).  Clinical Course     **I have reviewed nursing notes, vital signs, and all appropriate lab and imaging results for this patient.*  Final Clinical Impressions(s) / ED Diagnoses  The blood pressure is elevated at 160/91, otherwise the vital signs within normal limits. The airway is patent. There is no swelling under the tongue. There is no evidence for Lugwig's angina. The plan at this time is for the patient to receive clindamycin on. He is to follow-up with his dentist on Monday, November 27.    Final diagnoses:  None    New Prescriptions New Prescriptions   No medications on file     Lily Kocher, PA-C 07/26/16 [place], DO 07/27/16 [phone]

## 2016-08-05 ENCOUNTER — Other Ambulatory Visit: Payer: Self-pay | Admitting: Student

## 2016-08-05 DIAGNOSIS — Z125 Encounter for screening for malignant neoplasm of prostate: Secondary | ICD-10-CM

## 2016-08-05 DIAGNOSIS — E785 Hyperlipidemia, unspecified: Secondary | ICD-10-CM

## 2016-08-05 DIAGNOSIS — E1165 Type 2 diabetes mellitus with hyperglycemia: Secondary | ICD-10-CM

## 2016-08-05 DIAGNOSIS — IMO0002 Reserved for concepts with insufficient information to code with codable children: Secondary | ICD-10-CM

## 2016-08-05 DIAGNOSIS — I10 Essential (primary) hypertension: Secondary | ICD-10-CM

## 2016-08-05 DIAGNOSIS — E118 Type 2 diabetes mellitus with unspecified complications: Secondary | ICD-10-CM

## 2016-08-07 LAB — COMPREHENSIVE METABOLIC PANEL
ALBUMIN: 4.1 g/dL (ref 3.6–5.1)
ALK PHOS: 119 U/L — AB (ref 40–115)
ALT: 30 U/L (ref 9–46)
AST: 18 U/L (ref 10–35)
BUN: 12 mg/dL (ref 7–25)
CHLORIDE: 103 mmol/L (ref 98–110)
CO2: 32 mmol/L — ABNORMAL HIGH (ref 20–31)
CREATININE: 0.76 mg/dL (ref 0.70–1.33)
Calcium: 9.6 mg/dL (ref 8.6–10.3)
Glucose, Bld: 141 mg/dL — ABNORMAL HIGH (ref 65–99)
POTASSIUM: 4.8 mmol/L (ref 3.5–5.3)
Sodium: 141 mmol/L (ref 135–146)
TOTAL PROTEIN: 6.7 g/dL (ref 6.1–8.1)
Total Bilirubin: 0.3 mg/dL (ref 0.2–1.2)

## 2016-08-08 LAB — LIPID PANEL
Cholesterol: 132 mg/dL (ref <200)
HDL: 22 mg/dL — AB (ref >40)
LDL CALC: 56 mg/dL (ref <100)
TRIGLYCERIDES: 272 mg/dL — AB (ref <150)
Total CHOL/HDL Ratio: 6 Ratio — ABNORMAL HIGH (ref <5.0)
VLDL: 54 mg/dL — ABNORMAL HIGH (ref <30)

## 2016-08-08 LAB — PSA: PSA: 0.3 ng/mL (ref <=4.0)

## 2016-08-08 LAB — HEMOGLOBIN A1C
HEMOGLOBIN A1C: 6.3 % — AB (ref <5.7)
MEAN PLASMA GLUCOSE: 134 mg/dL

## 2016-08-11 ENCOUNTER — Encounter: Payer: Self-pay | Admitting: Physician Assistant

## 2016-08-11 ENCOUNTER — Ambulatory Visit: Payer: Medicaid Other | Admitting: Physician Assistant

## 2016-08-11 VITALS — BP 144/88 | HR 95 | Temp 97.9°F | Ht 65.0 in | Wt 227.5 lb

## 2016-08-11 DIAGNOSIS — F17219 Nicotine dependence, cigarettes, with unspecified nicotine-induced disorders: Secondary | ICD-10-CM

## 2016-08-11 DIAGNOSIS — I10 Essential (primary) hypertension: Secondary | ICD-10-CM

## 2016-08-11 DIAGNOSIS — E119 Type 2 diabetes mellitus without complications: Secondary | ICD-10-CM

## 2016-08-11 DIAGNOSIS — E669 Obesity, unspecified: Secondary | ICD-10-CM

## 2016-08-11 DIAGNOSIS — J449 Chronic obstructive pulmonary disease, unspecified: Secondary | ICD-10-CM

## 2016-08-11 DIAGNOSIS — E785 Hyperlipidemia, unspecified: Secondary | ICD-10-CM

## 2016-08-11 DIAGNOSIS — Z8739 Personal history of other diseases of the musculoskeletal system and connective tissue: Secondary | ICD-10-CM

## 2016-08-11 DIAGNOSIS — Z1211 Encounter for screening for malignant neoplasm of colon: Secondary | ICD-10-CM

## 2016-08-11 DIAGNOSIS — Z6838 Body mass index (BMI) 38.0-38.9, adult: Secondary | ICD-10-CM

## 2016-08-11 MED ORDER — NICOTINE 7 MG/24HR TD PT24: 7.0000 mg | MEDICATED_PATCH | Freq: Every day | TRANSDERMAL | 1 refills | Status: DC

## 2016-08-11 NOTE — Progress Notes (Signed)
BP (!) 144/88 (BP Location: Left Arm, Patient Position: Sitting, Cuff Size: Normal)   Pulse 95   Temp 97.9 F (36.6 C) (Other (Comment))   Ht '5\' 5"'$  (1.651 m)   Wt 227 lb 8 oz (103.2 kg)   SpO2 97%   BMI 37.86 kg/m    Subjective:    Patient ID: Randy Hebert, male    DOB: 1966-02-04, 50 y.o.   MRN: [id number]  HPI: Randy Hebert is a 50 y.o. male presenting on 08/11/2016 for Diabetes and Hypertension   HPI   Pt is good today.  States not much appetite since starting trazodone  Pt is still smoking. Requests refill on nicotine patches given in hospital.   Pt wants to see neurologist for neck pain. Says he saw one in the past and he wants to go back.  He says he has "dryness in his neck bone".   In the past he had HA due to his neck bones but currently is not having HA.   Pt had CT in 2014 that showed OA.    Says he just wanted to get it "checked"  Pt has f/u with ortho tomorrow for hip s/p ORIF.   Relevant past medical, surgical, family and social history reviewed and updated as indicated. Interim medical history since our last visit reviewed. Allergies and medications reviewed  and updated.   Current Outpatient Prescriptions:  .  albuterol (PROVENTIL HFA;VENTOLIN HFA) 108 (90 Base) MCG/ACT inhaler, Inhale 2 puffs into the lungs every 6 (six) hours as needed for wheezing or shortness of breath., Disp: 1 Inhaler, Rfl: 4 .  albuterol (PROVENTIL) (2.5 MG/3ML) 0.083% nebulizer solution, Take 3 mLs (2.5 mg total) by nebulization every 6 (six) hours as needed for wheezing or shortness of breath., Disp: 150 mL, Rfl: 1 .  amLODipine (NORVASC) 10 MG tablet, Take 1 tablet (10 mg total) by mouth daily., Disp: 30 tablet, Rfl: 3 .  aspirin EC 81 MG tablet, Take 1 tablet (81 mg total) by mouth 2 (two) times daily after a meal., Disp: 60 tablet, Rfl: 0 .  atorvastatin (LIPITOR) 20 MG tablet, Take 20 mg by mouth daily., Disp: , Rfl: 2 .  Carboxymeth-Glycerin-Polysorb (REFRESH OPTIVE  ADVANCED) 0.5-1-0.5 % SOLN, Apply 1 drop to eye daily as needed (dry eyes). , Disp: , Rfl:  .  citalopram (CELEXA) 10 MG tablet, Take 10 mg by mouth daily., Disp: , Rfl:  .  cycloSPORINE (RESTASIS) 0.05 % ophthalmic emulsion, Place 1 drop into both eyes 2 (two) times daily., Disp: , Rfl:  .  ibuprofen (ADVIL,MOTRIN) 400 MG tablet, Take 1 tablet (400 mg total) by mouth 3 (three) times daily., Disp: 30 tablet, Rfl: 0 .  isosorbide mononitrate (IMDUR) 30 MG 24 hr tablet, Take 0.5 tablets (15 mg total) by mouth daily., Disp: 45 tablet, Rfl: 3 .  Menthol, Topical Analgesic, (BENGAY EX), Apply 1 application topically at bedtime. , Disp: , Rfl:  .  metFORMIN (GLUCOPHAGE) 1000 MG tablet, Take 1 tablet (1,000 mg total) by mouth 2 (two) times daily with a meal., Disp: 60 tablet, Rfl: 3 .  mometasone-formoterol (DULERA) 100-5 MCG/ACT AERO, Inhale 2 puffs into the lungs 2 (two) times daily., Disp: 1 Inhaler, Rfl: 4 .  nitroGLYCERIN (NITROSTAT) 0.4 MG SL tablet, Place 1 tablet (0.4 mg total) under the tongue every 5 (five) minutes as needed for chest pain., Disp: 25 tablet, Rfl: 3 .  Olopatadine HCl (PAZEO) 0.7 % SOLN, Apply 1 drop to eye daily.,  Disp: , Rfl:  .  oxymetazoline (AFRIN) 0.05 % nasal spray, Place 1 spray into both nostrils 2 (two) times daily as needed for congestion., Disp: , Rfl:  .  traZODone (DESYREL) 100 MG tablet, Take 100 mg by mouth at bedtime., Disp: , Rfl:  .  Omega-3 Fatty Acids (FISH OIL PO), Take 1,200 mg by mouth 2 (two) times daily., Disp: , Rfl:   Current Facility-Administered Medications:  .  nicotine (NICODERM CQ - dosed in mg/24 hr) patch 7 mg, 7 mg, Transdermal, Daily, Pete Pelt, PA-C   Review of Systems  Constitutional: Positive for appetite change and diaphoresis. Negative for chills, fatigue, fever and unexpected weight change.  HENT: Positive for congestion, dental problem, sneezing and voice change. Negative for drooling, ear pain, facial swelling, hearing loss,  mouth sores, sore throat and trouble swallowing.   Eyes: Positive for itching. Negative for pain, discharge, redness and visual disturbance.  Respiratory: Positive for cough and shortness of breath. Negative for choking and wheezing.   Cardiovascular: Positive for leg swelling. Negative for chest pain and palpitations.  Gastrointestinal: Negative for abdominal pain, blood in stool, constipation, diarrhea and vomiting.  Endocrine: Positive for heat intolerance. Negative for cold intolerance and polydipsia.  Genitourinary: Negative for decreased urine volume, dysuria and hematuria.  Musculoskeletal: Positive for arthralgias, back pain and gait problem.  Skin: Negative for rash.  Allergic/Immunologic: Positive for environmental allergies.  Neurological: Negative for seizures, syncope, light-headedness and headaches.  Hematological: Negative for adenopathy.  Psychiatric/Behavioral: Positive for agitation. Negative for dysphoric mood and suicidal ideas. The patient is not nervous/anxious.     Per HPI unless specifically indicated above     Objective:    BP (!) 144/88 (BP Location: Left Arm, Patient Position: Sitting, Cuff Size: Normal)   Pulse 95   Temp 97.9 F (36.6 C) (Other (Comment))   Ht '5\' 5"'$  (1.651 m)   Wt 227 lb 8 oz (103.2 kg)   SpO2 97%   BMI 37.86 kg/m   Wt Readings from Last 3 Encounters:  08/11/16 227 lb 8 oz (103.2 kg)  07/26/16 220 lb (99.8 kg)  07/01/16 230 lb 8 oz (104.6 kg)    Physical Exam  Constitutional: He is oriented to person, place, and time. He appears well-developed and well-nourished.  HENT:  Head: Normocephalic and atraumatic.  Neck: Neck supple.  Cardiovascular: Normal rate and regular rhythm.   Pulmonary/Chest: Effort normal and breath sounds normal. He has no wheezes.  Abdominal: Soft. Bowel sounds are normal. There is no hepatosplenomegaly. There is no tenderness.  Musculoskeletal: He exhibits no edema.  Surgical site without evidence infection   Lymphadenopathy:    He has no cervical adenopathy.  Neurological: He is alert and oriented to person, place, and time.  Skin: Skin is warm and dry.  Psychiatric: He has a normal mood and affect. His behavior is normal.  Vitals reviewed.   Results for orders placed or performed in visit on 08/05/16  PSA  Result Value Ref Range   PSA 0.3 <=4.0 ng/mL  Comprehensive Metabolic Panel (CMET)  Result Value Ref Range   Sodium 141 135 - 146 mmol/L   Potassium 4.8 3.5 - 5.3 mmol/L   Chloride 103 98 - 110 mmol/L   CO2 32 (H) 20 - 31 mmol/L   Glucose, Bld 141 (H) 65 - 99 mg/dL   BUN 12 7 - 25 mg/dL   Creat 0.76 0.70 - 1.33 mg/dL   Total Bilirubin 0.3 0.2 - 1.2 mg/dL  Alkaline Phosphatase 119 (H) 40 - 115 U/L   AST 18 10 - 35 U/L   ALT 30 9 - 46 U/L   Total Protein 6.7 6.1 - 8.1 g/dL   Albumin 4.1 3.6 - 5.1 g/dL   Calcium 9.6 8.6 - 10.3 mg/dL  HgB A1c  Result Value Ref Range   Hgb A1c MFr Bld 6.3 (H) <5.7 %   Mean Plasma Glucose 134 mg/dL  Lipid Profile  Result Value Ref Range   Cholesterol 132 <200 mg/dL   Triglycerides 272 (H) <150 mg/dL   HDL 22 (L) >40 mg/dL   Total CHOL/HDL Ratio 6.0 (H) <5.0 Ratio   VLDL 54 (H) <30 mg/dL   LDL Cholesterol 56 <100 mg/dL      Assessment & Plan:   Encounter Diagnoses  Name Primary?  . Controlled type 2 diabetes mellitus without complication, unspecified long term insulin use status ([place]) Yes  . Essential hypertension   . Hyperlipidemia, unspecified hyperlipidemia type   . Chronic obstructive pulmonary disease, unspecified COPD type ([place])   . Cigarette nicotine dependence with nicotine-induced disorder   . Class 2 obesity with body mass index (BMI) of 38.0 to 38.9 in adult, unspecified obesity type, unspecified whether serious comorbidity present   . History of neck pain   . Special screening for malignant neoplasms, colon     -reviewed labs with pt -counseled pt to Restart fish oil -counseled pt to Take bp meds regularly- a bit high  today -Counseled smoking cessation- rx nicotine patch sent -Refer to GI for screening colonoscopy -will Check with dr Benjamine Mola for name of referral/specialist for his throat (record request sent) -discussed with pt that referral to neurology to get his neck "checked" because he had problems with it in the past is not appropriate.  He states understanding -F/u 3 months.  RTO sooner prn  (The duration of this appointment visit was 35 minutes of face-to-face time with the patient.  Greater than 50% of this time was spent in counseling, explanation of diagnosis, planning of further management, and coordination of care.)

## 2016-08-12 ENCOUNTER — Encounter (INDEPENDENT_AMBULATORY_CARE_PROVIDER_SITE_OTHER): Payer: Self-pay | Admitting: Orthopaedic Surgery

## 2016-08-12 ENCOUNTER — Ambulatory Visit (INDEPENDENT_AMBULATORY_CARE_PROVIDER_SITE_OTHER): Payer: Medicaid Other | Admitting: Orthopaedic Surgery

## 2016-08-12 DIAGNOSIS — Z96641 Presence of right artificial hip joint: Secondary | ICD-10-CM

## 2016-08-12 NOTE — Progress Notes (Signed)
The patient is now about 6 weeks status post a right total hip arthroplasty through direct anterior approach. He says he is doing well. He is ambulating without an assistive device. His last visit I did drain a seroma from his hip.  On examination of his right hip incision there is no significant seroma. There is firmness of the muscle around the hip incision which is to be expected postoperatively. He has excellent range of motion of his right hip and his leg lengths are equal. There is no redness around the incision.  At this point a continued to increase his activities. We'll see him back in a month to see how is doing overall. If he looks good at that visit we will probably release him to follow-up at one year postoperative. No x-rays are needed at his next visit in 4 weeks.

## 2016-08-12 NOTE — Progress Notes (Signed)
Office Visit Note   Patient: Randy Hebert           Date of Birth: [birth year]           MRN: [id number] Visit Date: 08/12/2016              Requested by: Soyla Dryer, PA-C [address] [place], [place] [address] PCP: Soyla Dryer, PA-C   Assessment & Plan: Visit Diagnoses:  1. Status post total replacement of right hip     Plan: See note dictated earlier today.  Follow-Up Instructions: No Follow-up on file.   Orders:  No orders of the defined types were placed in this encounter.  No orders of the defined types were placed in this encounter.     Procedures: No procedures performed   Clinical Data: No additional findings.   Subjective: Chief Complaint  Patient presents with  . Right Hip - Routine Post Op    Surgery 07/01/16    Mr. Leclaire is here to follow up from hip surgery. He states that the hip is going great but his does have swelling and is tender over the incision area.    Review of Systems   Objective: Vital Signs: There were no vitals taken for this visit.  Physical Exam  Ortho Exam  Specialty Comments:  No specialty comments available.  Imaging: No results found.   PMFS History: Patient Active Problem List   Diagnosis Date Noted  . Unilateral primary osteoarthritis, right hip 07/01/2016  . 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 Type 2 diabetes mellitus without complication, without long-term current use of insulin ([place]) 04/26/2016  . Right hip pain 12/19/2015  . Chest pain 12/10/2015  . Chronic obstructive pulmonary disease ([place]) 08/27/2015  . Cigarette nicotine dependence with nicotine-induced disorder 08/27/2015  . Hyperlipidemia 08/27/2015  . Obesity, unspecified 08/27/2015  . Dyspnea 07/11/2015  . Acute respiratory failure with hypoxia ([place]) 06/02/2015  . Acute respiratory failure with hypoxemia ([place]) 06/02/2015  . Acute on chronic systolic heart failure ([place]) 06/02/2015  . ACE  inhibitor-aggravated angioedema   . Chronic systolic CHF (congestive heart failure) ([place]) 06/01/2015  . CAD (coronary artery disease) 05/31/2015  . STEMI (ST elevation myocardial infarction) ([place]) 05/31/2015  . Tobacco use disorder 05/31/2015  . Angioedema 05/30/2015  . Sinus pause secondary to beta blocker 02/09/2015  . Essential hypertension 02/09/2015  . ST elevation myocardial infarction (STEMI) of lateral wall, initial episode of care [facility]) 02/06/2015    Class: Hospitalized for   Past Medical History:  Diagnosis Date  . Anxiety   . Arthritis   . Back pain   . Bulging of cervical intervertebral disc   . CAD (coronary artery disease)    lateral STEMI 02/06/2015 00% D1 occlusion treated with Promus Premier 2.5 mm x 16 mm DES, 70% ramus stenosis, 40% mid RCA stenosis, 45% distal RCA stenosis, EF 45-50%  . CHF (congestive heart failure) ([place])   . COPD (chronic obstructive pulmonary disease) ([place])   . Depression   . Diabetes mellitus without complication ([place])   . Dyspnea   . GERD (gastroesophageal reflux disease)   . Headache   . Hip pain   . MI (myocardial infarction)   . Neck pain   . Otitis media   . Pleurisy   . Sinus pause    9 sec sinus pause on telemetry after started on coreg after MI, avoid AV nodal blocking agent    Family History  Problem Relation Age  of Onset  . Heart attack Father   . Stroke Father     Past Surgical History:  Procedure Laterality Date  . CARDIAC CATHETERIZATION N/A 02/06/2015   Procedure: Left Heart Cath and Coronary Angiography;  Surgeon: Leonie Man, MD;  Location: [place] [facility];  Service: Cardiovascular;  Laterality: N/A;  . CARDIAC CATHETERIZATION N/A 02/06/2015   Procedure: Coronary Stent Intervention;  Surgeon: Leonie Man, MD;  Location: [place] [facility];  Service: Cardiovascular;  Laterality: N/A;  . CORONARY ANGIOPLASTY WITH STENT PLACEMENT    . INCISION / DRAINAGE HAND / FINGER    . TOTAL HIP ARTHROPLASTY Right 07/01/2016   . TOTAL HIP ARTHROPLASTY Right 07/01/2016   Procedure: RIGHT TOTAL HIP ARTHROPLASTY ANTERIOR APPROACH;  Surgeon: Mcarthur Rossetti, MD;  Location: [place];  Service: Orthopedics;  Laterality: Right;   Social History   Occupational History  . Not on file.   Social History Main Topics  . Smoking status: Current Some Day Smoker    Packs/day: 0.50    Years: 46.00    Types: Cigarettes    Start date: 06/07/1979  . Smokeless tobacco: Former Systems developer    Types: Chew     Comment: plans to get patches  . Alcohol use No     Comment: 2 glasses of wine yesterday  . Drug use: No     Comment: history of drug use- marijuana, cocaine  . Sexual activity: Yes    Partners: Female    Birth control/ protection: None

## 2016-08-27 ENCOUNTER — Telehealth: Payer: Self-pay

## 2016-08-27 NOTE — Telephone Encounter (Signed)
Patient received letter to schedule a tcs

## 2016-08-27 NOTE — Telephone Encounter (Signed)
PATIENT RECEIVED LETTER TO SCHEDULE TCS

## 2016-09-03 ENCOUNTER — Telehealth: Payer: Self-pay

## 2016-09-03 NOTE — Telephone Encounter (Signed)
Pt was referred by Soyla Dryer, PA for screening colonoscopy. PT said he had colitis in the late summer of 2017. He also has some occasional blood in stool and has had a decrease in appetite. Also has some problems with reflux. He has never had a colonoscopy. OV with Neil Crouch, PA on 09/23/2016 at 8:00 Am.

## 2016-09-03 NOTE — Telephone Encounter (Signed)
See note of 09/03/2016.

## 2016-09-08 ENCOUNTER — Other Ambulatory Visit: Payer: Self-pay | Admitting: Physician Assistant

## 2016-09-08 MED ORDER — ATORVASTATIN CALCIUM 20 MG PO TABS: 20.0000 mg | ORAL_TABLET | Freq: Every day | ORAL | 4 refills | Status: DC

## 2016-09-09 ENCOUNTER — Encounter (INDEPENDENT_AMBULATORY_CARE_PROVIDER_SITE_OTHER): Payer: Self-pay | Admitting: Orthopaedic Surgery

## 2016-09-09 ENCOUNTER — Ambulatory Visit (INDEPENDENT_AMBULATORY_CARE_PROVIDER_SITE_OTHER): Payer: Medicaid Other | Admitting: Physician Assistant

## 2016-09-09 DIAGNOSIS — Z96641 Presence of right artificial hip joint: Secondary | ICD-10-CM

## 2016-09-09 NOTE — Progress Notes (Signed)
Post-Op Visit Note   Patient: Randy Hebert           Date of Birth: 1966/01/30           MRN: [id number] Visit Date: 09/09/2016 PCP: Soyla Dryer, PA-C    Chief Complaint:  Status post right total hip arthroplasty 10 weeks overall doing well some some soreness and stiffness whenever he first begins to ambulate.. Does have some low back pain just chronic in nature. He seen a neurosurgeon in the past unsure name of the surgeon for cervical disc bulges.  Chief Complaint  Patient presents with  . Right Hip - Follow-up  . Follow-up   Visit Diagnoses:  1. Status post total replacement of right hip    Exam: Full range of motion of the right hip without pain. Ambulates without any assistive devices. Calf supple nontender. Surgical incisions healing well no signs of infection or seroma   Plan: Follow-up in October for AP pelvis and lateral view of the right hip. Regards to his back and speak to his primary care physician about this it sounds as if he seen a neurosurgeon in the past and he definitely could follow with them or one of the orthopedic spine specialist here in the office.  Follow-Up Instructions: Return in about 9 months (around 06/09/2017) for Radiographs.   Orders:  No orders of the defined types were placed in this encounter.  No orders of the defined types were placed in this encounter.    PMFS History: Patient Active Problem List   Diagnosis Date Noted  . Unilateral primary osteoarthritis, right hip 07/01/2016  . 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 Type 2 diabetes mellitus without complication, without long-term current use of insulin ([place]) 04/26/2016  . Right hip pain 12/19/2015  . Chest pain 12/10/2015  . Chronic obstructive pulmonary disease ([place]) 08/27/2015  . Cigarette nicotine dependence with nicotine-induced disorder 08/27/2015  . Hyperlipidemia 08/27/2015  . Obesity, unspecified 08/27/2015  . Dyspnea 07/11/2015  . Acute  respiratory failure with hypoxia ([place]) 06/02/2015  . Acute respiratory failure with hypoxemia ([place]) 06/02/2015  . Acute on chronic systolic heart failure ([place]) 06/02/2015  . ACE inhibitor-aggravated angioedema   . Chronic systolic CHF (congestive heart failure) ([place]) 06/01/2015  . CAD (coronary artery disease) 05/31/2015  . STEMI (ST elevation myocardial infarction) ([place]) 05/31/2015  . Tobacco use disorder 05/31/2015  . Angioedema 05/30/2015  . Sinus pause secondary to beta blocker 02/09/2015  . Essential hypertension 02/09/2015  . ST elevation myocardial infarction (STEMI) of lateral wall, initial episode of care [facility]) 02/06/2015    Class: Hospitalized for   Past Medical History:  Diagnosis Date  . Anxiety   . Arthritis   . Back pain   . Bulging of cervical intervertebral disc   . CAD (coronary artery disease)    lateral STEMI 02/06/2015 00% D1 occlusion treated with Promus Premier 2.5 mm x 16 mm DES, 70% ramus stenosis, 40% mid RCA stenosis, 45% distal RCA stenosis, EF 45-50%  . CHF (congestive heart failure) ([place])   . COPD (chronic obstructive pulmonary disease) ([place])   . Depression   . Diabetes mellitus without complication ([place])   . Dyspnea   . GERD (gastroesophageal reflux disease)   . Headache   . Hip pain   . MI (myocardial infarction)   . Neck pain   . Otitis media   . Pleurisy   . Sinus pause    9 sec sinus pause on telemetry  after started on coreg after MI, avoid AV nodal blocking agent    Family History  Problem Relation Age of Onset  . Heart attack Father   . Stroke Father     Past Surgical History:  Procedure Laterality Date  . CARDIAC CATHETERIZATION N/A 02/06/2015   Procedure: Left Heart Cath and Coronary Angiography;  Surgeon: Leonie Man, MD;  Location: [place] [facility];  Service: Cardiovascular;  Laterality: N/A;  . CARDIAC CATHETERIZATION N/A 02/06/2015   Procedure: Coronary Stent Intervention;  Surgeon: Leonie Man, MD;  Location: [place] [facility]  [facility];  Service: Cardiovascular;  Laterality: N/A;  . CORONARY ANGIOPLASTY WITH STENT PLACEMENT    . INCISION / DRAINAGE HAND / FINGER    . TOTAL HIP ARTHROPLASTY Right 07/01/2016  . TOTAL HIP ARTHROPLASTY Right 07/01/2016   Procedure: RIGHT TOTAL HIP ARTHROPLASTY ANTERIOR APPROACH;  Surgeon: Mcarthur Rossetti, MD;  Location: [place];  Service: Orthopedics;  Laterality: Right;   Social History   Occupational History  . Not on file.   Social History Main Topics  . Smoking status: Current Some Day Smoker    Packs/day: 0.50    Years: 46.00    Types: Cigarettes    Start date: 06/07/1979  . Smokeless tobacco: Former Systems developer    Types: Chew     Comment: plans to get patches  . Alcohol use No     Comment: 2 glasses of wine yesterday  . Drug use: No     Comment: history of drug use- marijuana, cocaine  . Sexual activity: Yes    Partners: Female    Birth control/ protection: None

## 2016-09-10 ENCOUNTER — Other Ambulatory Visit: Payer: Self-pay | Admitting: Physician Assistant

## 2016-09-10 MED ORDER — ATORVASTATIN CALCIUM 20 MG PO TABS: 20.0000 mg | ORAL_TABLET | Freq: Every day | ORAL | 4 refills | Status: DC

## 2016-09-12 ENCOUNTER — Encounter (HOSPITAL_COMMUNITY): Payer: Self-pay | Admitting: Emergency Medicine

## 2016-09-12 ENCOUNTER — Emergency Department (HOSPITAL_COMMUNITY): Payer: Medicaid Other

## 2016-09-12 ENCOUNTER — Emergency Department (HOSPITAL_COMMUNITY)
Admission: EM | Admit: 2016-09-12 | Discharge: 2016-09-12 | Disposition: A | Discharge status: Home / Self Care | Payer: Medicaid Other | Attending: Emergency Medicine | Admitting: Emergency Medicine

## 2016-09-12 DIAGNOSIS — F419 Anxiety disorder, unspecified: Secondary | ICD-10-CM | POA: Insufficient documentation

## 2016-09-12 DIAGNOSIS — I5022 Chronic systolic (congestive) heart failure: Secondary | ICD-10-CM | POA: Diagnosis not present

## 2016-09-12 DIAGNOSIS — J449 Chronic obstructive pulmonary disease, unspecified: Secondary | ICD-10-CM | POA: Diagnosis not present

## 2016-09-12 DIAGNOSIS — Z7984 Long term (current) use of oral hypoglycemic drugs: Secondary | ICD-10-CM | POA: Diagnosis not present

## 2016-09-12 DIAGNOSIS — F17210 Nicotine dependence, cigarettes, uncomplicated: Secondary | ICD-10-CM | POA: Insufficient documentation

## 2016-09-12 DIAGNOSIS — I2510 Atherosclerotic heart disease of native coronary artery without angina pectoris: Secondary | ICD-10-CM | POA: Insufficient documentation

## 2016-09-12 DIAGNOSIS — G8929 Other chronic pain: Secondary | ICD-10-CM | POA: Diagnosis not present

## 2016-09-12 DIAGNOSIS — E119 Type 2 diabetes mellitus without complications: Secondary | ICD-10-CM | POA: Insufficient documentation

## 2016-09-12 DIAGNOSIS — Z955 Presence of coronary angioplasty implant and graft: Secondary | ICD-10-CM | POA: Diagnosis not present

## 2016-09-12 DIAGNOSIS — M545 Low back pain: Secondary | ICD-10-CM | POA: Diagnosis present

## 2016-09-12 DIAGNOSIS — I110 Hypertensive heart disease with heart failure: Secondary | ICD-10-CM | POA: Insufficient documentation

## 2016-09-12 DIAGNOSIS — M5136 Other intervertebral disc degeneration, lumbar region: Secondary | ICD-10-CM | POA: Diagnosis not present

## 2016-09-12 DIAGNOSIS — Z7982 Long term (current) use of aspirin: Secondary | ICD-10-CM | POA: Insufficient documentation

## 2016-09-12 MED ORDER — CYCLOBENZAPRINE HCL 10 MG PO TABS: 10.0000 mg | ORAL_TABLET | Freq: Three times a day (TID) | ORAL | 0 refills | Status: DC

## 2016-09-12 NOTE — ED Triage Notes (Signed)
PT c/o lower back pain and muscle spasms recurrent starting again x1 week ago and states has been more common since his hip replacement surgery. PT denies any new injury and is ambulatory with a cane and denies any bowel or bladder issues.

## 2016-09-12 NOTE — Discharge Instructions (Signed)
Your vital signs within normal limits. The x-ray of your lumbar spine shows arthritis present as well as degenerative disc disease changes. This probably accounts for some of the spasm and cramping type pain that you have in your back. Please discuss this with Randy Hebert, and or your neurosurgery person. Please use a heating pad to your back is much as possible. Use diclofenac and Flexeril for assistance with your discomfort. Flexeril may cause drowsiness, please do not drink alcohol, drive a vehicle, or operate machinery when taking this medication.

## 2016-09-12 NOTE — ED Provider Notes (Signed)
[facility] DEPT Provider Note   CSN: [id number] Arrival date & time: 09/12/16  0944     History   Chief Complaint Chief Complaint  Patient presents with  . Back Pain    HPI Randy Hebert is a 51 y.o. male.  Patient reports hip replacement surgery in October 2017. He states that he has had problems with his neck and back prior to that surgery, but the pain seems to be worse since having had the surgery. He states that when he walks. [place] approximately 60 feet, he has tightening in his lower back. The tightening sometime become so severe that he has to stop what he is doing, standard respiratory a few minutes before he can continue whatever activity is going on. He states that he cannot see his primary physician until March 2018, and he would like to have an x-ray to attempt to find out what is going on with his lower back.   The history is provided by the patient.  Back Pain   This is a chronic problem. The current episode started more than 1 week ago. The problem occurs daily. The problem has been gradually worsening. The pain is associated with no known injury. The pain is present in the lumbar spine. The pain is moderate. The symptoms are aggravated by certain positions. The pain is the same all the time. Pertinent negatives include no chest pain, no abdominal pain, no bowel incontinence, no perianal numbness, no bladder incontinence and no dysuria. He has tried nothing for the symptoms.    Past Medical History:  Diagnosis Date  . Anxiety   . Arthritis   . Back pain   . Bulging of cervical intervertebral disc   . CAD (coronary artery disease)    lateral STEMI 02/06/2015 00% D1 occlusion treated with Promus Premier 2.5 mm x 16 mm DES, 70% ramus stenosis, 40% mid RCA stenosis, 45% distal RCA stenosis, EF 45-50%  . CHF (congestive heart failure) ([place])   . COPD (chronic obstructive pulmonary disease) ([place])   . Depression   . Diabetes mellitus without complication ([place])     . Dyspnea   . GERD (gastroesophageal reflux disease)   . Headache   . Hip pain   . MI (myocardial infarction)   . Neck pain   . Otitis media   . Pleurisy   . Sinus pause    9 sec sinus pause on telemetry after started on coreg after MI, avoid AV nodal blocking agent    Patient Active Problem List   Diagnosis Date Noted  . Unilateral primary osteoarthritis, right hip 07/01/2016  . 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 Type 2 diabetes mellitus without complication, without long-term current use of insulin ([place]) 04/26/2016  . Right hip pain 12/19/2015  . Chest pain 12/10/2015  . Chronic obstructive pulmonary disease ([place]) 08/27/2015  . Cigarette nicotine dependence with nicotine-induced disorder 08/27/2015  . Hyperlipidemia 08/27/2015  . Obesity, unspecified 08/27/2015  . Dyspnea 07/11/2015  . Acute respiratory failure with hypoxia ([place]) 06/02/2015  . Acute respiratory failure with hypoxemia ([place]) 06/02/2015  . Acute on chronic systolic heart failure ([place]) 06/02/2015  . ACE inhibitor-aggravated angioedema   . Chronic systolic CHF (congestive heart failure) ([place]) 06/01/2015  . CAD (coronary artery disease) 05/31/2015  . STEMI (ST elevation myocardial infarction) ([place]) 05/31/2015  . Tobacco use disorder 05/31/2015  . Angioedema 05/30/2015  . Sinus pause secondary to beta blocker 02/09/2015  . Essential hypertension 02/09/2015  . ST  elevation myocardial infarction (STEMI) of lateral wall, initial episode of care [facility]) 02/06/2015    Class: Hospitalized for    Past Surgical History:  Procedure Laterality Date  . CARDIAC CATHETERIZATION N/A 02/06/2015   Procedure: Left Heart Cath and Coronary Angiography;  Surgeon: Leonie Man, MD;  Location: [facility] [facility];  Service: Cardiovascular;  Laterality: N/A;  . CARDIAC CATHETERIZATION N/A 02/06/2015   Procedure: Coronary Stent Intervention;  Surgeon: Leonie Man, MD;  Location: [place] [facility];  Service:  Cardiovascular;  Laterality: N/A;  . CORONARY ANGIOPLASTY WITH STENT PLACEMENT    . INCISION / DRAINAGE HAND / FINGER    . TOTAL HIP ARTHROPLASTY Right 07/01/2016  . TOTAL HIP ARTHROPLASTY Right 07/01/2016   Procedure: RIGHT TOTAL HIP ARTHROPLASTY ANTERIOR APPROACH;  Surgeon: Mcarthur Rossetti, MD;  Location: [place];  Service: Orthopedics;  Laterality: Right;       Home Medications    Prior to Admission medications   Medication Sig Start Date End Date Taking? Authorizing Provider  albuterol (PROVENTIL HFA;VENTOLIN HFA) 108 (90 Base) MCG/ACT inhaler Inhale 2 puffs into the lungs every 6 (six) hours as needed for wheezing or shortness of breath. 04/24/16  Yes Soyla Dryer, PA-C  albuterol (PROVENTIL) (2.5 MG/3ML) 0.083% nebulizer solution Take 3 mLs (2.5 mg total) by nebulization every 6 (six) hours as needed for wheezing or shortness of breath. 05/27/16  Yes Soyla Dryer, PA-C  amLODipine (NORVASC) 10 MG tablet Take 1 tablet (10 mg total) by mouth daily. 05/27/16  Yes Soyla Dryer, PA-C  aspirin EC 81 MG tablet Take 1 tablet (81 mg total) by mouth 2 (two) times daily after a meal. Patient taking differently: Take 81 mg by mouth daily.  07/03/16  Yes Mcarthur Rossetti, MD  atorvastatin (LIPITOR) 20 MG tablet Take 1 tablet (20 mg total) by mouth daily. Patient taking differently: Take 20 mg by mouth daily at 6 PM.  09/10/16  Yes Soyla Dryer, PA-C  Carboxymeth-Glycerin-Polysorb (REFRESH OPTIVE ADVANCED) 0.5-1-0.5 % SOLN Apply 1 drop to eye daily as needed (dry eyes).    Yes Historical Provider, MD  citalopram (CELEXA) 10 MG tablet Take 10 mg by mouth daily.   Yes Historical Provider, MD  cycloSPORINE (RESTASIS) 0.05 % ophthalmic emulsion Place 1 drop into both eyes 2 (two) times daily.   Yes Historical Provider, MD  ibuprofen (ADVIL,MOTRIN) 200 MG tablet Take 600 mg by mouth every 6 (six) hours as needed for moderate pain.   Yes Historical Provider, MD  isosorbide mononitrate  (IMDUR) 30 MG 24 hr tablet Take 0.5 tablets (15 mg total) by mouth daily. 08/03/15  Yes Arnoldo Lenis, MD  Menthol, Topical Analgesic, (BENGAY EX) Apply 1 application topically at bedtime.    Yes Historical Provider, MD  metFORMIN (GLUCOPHAGE) 1000 MG tablet Take 1 tablet (1,000 mg total) by mouth 2 (two) times daily with a meal. 05/27/16  Yes Soyla Dryer, PA-C  mometasone-formoterol (DULERA) 100-5 MCG/ACT AERO Inhale 2 puffs into the lungs 2 (two) times daily. 04/24/16  Yes Soyla Dryer, PA-C  nicotine (NICODERM CQ - DOSED IN MG/24 HOURS) 21 mg/24hr patch Place 21 mg onto the skin daily.   Yes Historical Provider, MD  nitroGLYCERIN (NITROSTAT) 0.4 MG SL tablet Place 1 tablet (0.4 mg total) under the tongue every 5 (five) minutes as needed for chest pain. 02/09/15  Yes Almyra Deforest, PA  Olopatadine HCl (PAZEO) 0.7 % SOLN Apply 1 drop to eye daily.   Yes Historical Provider, MD  Omega-3 Fatty Acids (FISH OIL PO) Take 1,200 mg by mouth 2 (two) times daily.   Yes Historical Provider, MD  oxymetazoline (AFRIN) 0.05 % nasal spray Place 1 spray into both nostrils 2 (two) times daily as needed for congestion.   Yes Historical Provider, MD  traZODone (DESYREL) 100 MG tablet Take 100 mg by mouth at bedtime.   Yes Historical Provider, MD  ibuprofen (ADVIL,MOTRIN) 400 MG tablet Take 1 tablet (400 mg total) by mouth 3 (three) times daily. Patient not taking: Reported on 09/12/2016 04/03/16   Ezequiel Essex, MD  nicotine (NICODERM CQ) 7 mg/24hr patch Place 1 patch (7 mg total) onto the skin daily. Patient not taking: Reported on 09/12/2016 08/11/16   Soyla Dryer, PA-C    Family History Family History  Problem Relation Age of Onset  . Heart attack Father   . Stroke Father     Social History Social History  Substance Use Topics  . Smoking status: Current Some Day Smoker    Packs/day: 0.50    Years: 46.00    Types: Cigarettes    Start date: 06/07/1979  . Smokeless tobacco: Former Systems developer    Types:  Chew     Comment: plans to get patches  . Alcohol use No     Comment: 2 glasses of wine yesterday     Allergies   Carvedilol; Lisinopril; and Amoxicillin   Review of Systems Review of Systems  Constitutional: Negative for activity change.       All ROS Neg except as noted in HPI  HENT: Negative for nosebleeds.   Eyes: Negative for photophobia and discharge.  Respiratory: Negative for cough, shortness of breath and wheezing.   Cardiovascular: Negative for chest pain and palpitations.  Gastrointestinal: Negative for abdominal pain, blood in stool and bowel incontinence.  Genitourinary: Negative for bladder incontinence, dysuria, frequency and hematuria.  Musculoskeletal: Positive for arthralgias and back pain. Negative for neck pain.  Skin: Negative.   Neurological: Negative for dizziness, seizures and speech difficulty.  Psychiatric/Behavioral: Negative for confusion and hallucinations. The patient is nervous/anxious.      Physical Exam Updated Vital Signs BP 138/84 (BP Location: Right Arm)   Pulse 96   Temp 97.6 F (36.4 C) (Oral)   Resp 18   Ht '5\' 5"'$  (1.651 m)   Wt 99.8 kg   SpO2 96%   BMI 36.61 kg/m   Physical Exam  Constitutional: He is oriented to person, place, and time. He appears well-developed and well-nourished.  Non-toxic appearance.  HENT:  Head: Normocephalic.  Right Ear: Tympanic membrane and external ear normal.  Left Ear: Tympanic membrane and external ear normal.  Eyes: EOM and lids are normal. Pupils are equal, round, and reactive to light.  Neck: Normal range of motion. Neck supple. Carotid bruit is not present.  Cardiovascular: Normal rate, regular rhythm, normal heart sounds, intact distal pulses and normal pulses.   Pulmonary/Chest: Breath sounds normal. No respiratory distress.  Abdominal: Soft. Bowel sounds are normal. There is no tenderness. There is no guarding.  Musculoskeletal: Normal range of motion.       Lumbar back: He exhibits pain  and spasm.  Lymphadenopathy:       Head (right side): No submandibular adenopathy present.       Head (left side): No submandibular adenopathy present.    He has no cervical adenopathy.  Neurological: He is alert and oriented to person, place, and time. He has normal strength. No cranial nerve deficit or sensory deficit.  Skin: Skin is warm and dry.  Psychiatric: He has a normal mood and affect. His speech is normal.  Nursing note and vitals reviewed.    ED Treatments / Results  Labs (all labs ordered are listed, but only abnormal results are displayed) Labs Reviewed - No data to display  EKG  EKG Interpretation None       Radiology No results found.  Procedures Procedures (including critical care time)  Medications Ordered in ED Medications - No data to display   Initial Impression / Assessment and Plan / ED Course  I have reviewed the triage vital signs and the nursing notes.  Pertinent labs & imaging results that were available during my care of the patient were reviewed by me and considered in my medical decision making (see chart for details).  Clinical Course     **I have reviewed nursing notes, vital signs, and all appropriate lab and imaging results for this patient.*  Final Clinical Impressions(s) / ED Diagnoses MDM Vital signs within normal limits. There no gross neurologic deficits appreciated on examination at this time. X-ray of the lumbar spine reveals advanced degenerative changes at the L4-L5 area at the L5-S1 area as well. There is loss of disc space at the L3-L4 and L5-S1 area. I discussed these findings with the patient in terms which he understands. I discussed with him the need to be seen by orthopedics as soon as possible concerning his continued pain and discomfort.    Final diagnoses:  Chronic right-sided low back pain, with sciatica presence unspecified  DDD (degenerative disc disease), lumbar    New Prescriptions Discharge Medication  List as of 09/12/2016 12:06 PM       Lily Kocher, PA-C 09/13/16 1701    Milton Ferguson, MD 09/22/16 1520

## 2016-09-22 ENCOUNTER — Telehealth (INDEPENDENT_AMBULATORY_CARE_PROVIDER_SITE_OTHER): Payer: Self-pay | Admitting: *Deleted

## 2016-09-22 ENCOUNTER — Telehealth: Payer: Self-pay | Admitting: Cardiology

## 2016-09-22 NOTE — Telephone Encounter (Signed)
See below

## 2016-09-22 NOTE — Telephone Encounter (Signed)
Faxed office. Pt will not need to take premed prior to dental cleaning.

## 2016-09-22 NOTE — Telephone Encounter (Signed)
Faxed to number provided

## 2016-09-22 NOTE — Telephone Encounter (Signed)
Called into pharmacy, patient aware 

## 2016-09-22 NOTE — Telephone Encounter (Signed)
Please call in amoxicillin for him. Two 500 mg tablets to take one hour before his procedure and then two tablets 6 hours after the procedure. thanks

## 2016-09-22 NOTE — Telephone Encounter (Signed)
Patient called in this morning in regards to some concerns about whether or not he needed to be put on antibiotics? He is having a deep cleaning done on Feb 1st. His CB # [phone]) [phone]. The fax number to the  Dental clinic is # [phone] fax to [facility]. Thank you

## 2016-09-22 NOTE — Telephone Encounter (Signed)
Pt is going to have dental work done, a "deep cleaning" and is wondering if he needs to take a premed prior to this. Please fax [facility] [phone]. He's scheduled for 10/02/16

## 2016-09-23 ENCOUNTER — Encounter: Payer: Self-pay | Admitting: Gastroenterology

## 2016-09-23 ENCOUNTER — Ambulatory Visit (INDEPENDENT_AMBULATORY_CARE_PROVIDER_SITE_OTHER): Payer: Medicaid Other | Admitting: Gastroenterology

## 2016-09-23 ENCOUNTER — Telehealth: Payer: Self-pay

## 2016-09-23 ENCOUNTER — Other Ambulatory Visit: Payer: Self-pay

## 2016-09-23 DIAGNOSIS — K219 Gastro-esophageal reflux disease without esophagitis: Secondary | ICD-10-CM

## 2016-09-23 DIAGNOSIS — G8929 Other chronic pain: Secondary | ICD-10-CM | POA: Diagnosis not present

## 2016-09-23 DIAGNOSIS — R6881 Early satiety: Secondary | ICD-10-CM

## 2016-09-23 DIAGNOSIS — K625 Hemorrhage of anus and rectum: Secondary | ICD-10-CM

## 2016-09-23 DIAGNOSIS — R1013 Epigastric pain: Secondary | ICD-10-CM

## 2016-09-23 HISTORY — DX: Early satiety: R68.81

## 2016-09-23 MED ORDER — SOD PICOSULFATE-MAG OX-CIT ACD 10-3.5-12 MG-GM-GM PO PACK: 1.0000 | PACK | ORAL | 0 refills | Status: DC

## 2016-09-23 NOTE — Telephone Encounter (Signed)
[facility]MOAM and informed pt of pre-op appt 10/06/16 at 12:45 pm. Letter also mailed.

## 2016-09-23 NOTE — Patient Instructions (Signed)
1. Colonoscopy and upper endoscopy with Dr. Rourk. See separate instructions. 

## 2016-09-23 NOTE — Progress Notes (Signed)
cc'ed to pcp °

## 2016-09-23 NOTE — Progress Notes (Signed)
Primary Care Physician:  Soyla Dryer, PA-C  Primary Gastroenterologist:  Garfield Cornea, MD   Chief Complaint  Patient presents with  . Colonoscopy    HPI:  Randy Hebert is a 51 y.o. male here for consideration of colonoscopy. When we called to schedule his colonoscopy he had multiple GI concerns therefore was offered an office visit. Patient has never had a colonoscopy. No known family history of colon cancer although his mother died of some sort of cancer but is unclear on the details. He reports having colitis last year. CT confirmed in August 2017. Beginning in the proximal descending colon and extending through the hepatic flexure into the ascending colon there was generalized: Wall thickening. Associated mesenteric stranding at the hepatic flexure. Cecum and appendix were normal. TI was normal. Hepatic steatosis noted. Major arterial structures patent.  Patient states his stools are pretty much normal but goes couple days without a BM at a time. No frequent diarrhea. Primary blood on the toilet tissue with wiping at times. No hemorrhoid issues to his knowledge. Continues to have a "nervous stomach". Gets cramps. Does not necessarily have diarrhea with it. Takes Pepcid when necessary for heartburn. Typically gets mostly nocturnal symptoms. No dysphagia. Complains of epigastric discomfort, early satiety which is been going on for several months. Complains of decreased appetite. Some nausea. No vomiting. He was diagnosed with diabetes 1 year ago. Well-controlled on metformin. He takes aspirin daily. Uses Motrin intermittently for headache, aches and pains.   Current Outpatient Prescriptions  Medication Sig Dispense Refill  . albuterol (PROVENTIL HFA;VENTOLIN HFA) 108 (90 Base) MCG/ACT inhaler Inhale 2 puffs into the lungs every 6 (six) hours as needed for wheezing or shortness of breath. 1 Inhaler 4  . albuterol (PROVENTIL) (2.5 MG/3ML) 0.083% nebulizer solution Take 3 mLs (2.5 mg total)  by nebulization every 6 (six) hours as needed for wheezing or shortness of breath. 150 mL 1  . amLODipine (NORVASC) 10 MG tablet Take 1 tablet (10 mg total) by mouth daily. 30 tablet 3  . aspirin EC 81 MG tablet Take 1 tablet (81 mg total) by mouth 2 (two) times daily after a meal. (Patient taking differently: Take 81 mg by mouth daily. ) 60 tablet 0  . atorvastatin (LIPITOR) 20 MG tablet Take 1 tablet (20 mg total) by mouth daily. (Patient taking differently: Take 20 mg by mouth daily at 6 PM. ) 30 tablet 4  . Carboxymeth-Glycerin-Polysorb (REFRESH OPTIVE ADVANCED) 0.5-1-0.5 % SOLN Apply 1 drop to eye daily as needed (dry eyes).     . citalopram (CELEXA) 10 MG tablet Take 10 mg by mouth daily.    . cycloSPORINE (RESTASIS) 0.05 % ophthalmic emulsion Place 1 drop into both eyes 2 (two) times daily.    Marland Kitchen ibuprofen (ADVIL,MOTRIN) 200 MG tablet Take 600 mg by mouth every 6 (six) hours as needed for moderate pain.    . isosorbide mononitrate (IMDUR) 30 MG 24 hr tablet Take 0.5 tablets (15 mg total) by mouth daily. 45 tablet 3  . Menthol, Topical Analgesic, (BENGAY EX) Apply 1 application topically at bedtime.     . metFORMIN (GLUCOPHAGE) 1000 MG tablet Take 1 tablet (1,000 mg total) by mouth 2 (two) times daily with a meal. 60 tablet 3  . mometasone-formoterol (DULERA) 100-5 MCG/ACT AERO Inhale 2 puffs into the lungs 2 (two) times daily. 1 Inhaler 4  . nicotine (NICODERM CQ - DOSED IN MG/24 HOURS) 21 mg/24hr patch Place 21 mg onto the skin daily.    Marland Kitchen  nicotine (NICODERM CQ) 7 mg/24hr patch Place 1 patch (7 mg total) onto the skin daily. 28 patch 1  . nitroGLYCERIN (NITROSTAT) 0.4 MG SL tablet Place 1 tablet (0.4 mg total) under the tongue every 5 (five) minutes as needed for chest pain. 25 tablet 3  . Olopatadine HCl (PAZEO) 0.7 % SOLN Apply 1 drop to eye daily.    . Omega-3 Fatty Acids (FISH OIL PO) Take 1,200 mg by mouth 2 (two) times daily.    Marland Kitchen oxymetazoline (AFRIN) 0.05 % nasal spray Place 1 spray  into both nostrils 2 (two) times daily as needed for congestion.    . traZODone (DESYREL) 100 MG tablet Take 100 mg by mouth at bedtime.     No current facility-administered medications for this visit.     Allergies as of 09/23/2016 - Review Complete 09/23/2016  Allergen Reaction Noted  . Carvedilol Other (See Comments) 02/08/2015  . Lisinopril Anaphylaxis, Shortness Of Breath, and Swelling 05/30/2015  . Amoxicillin Nausea And Vomiting 02/06/2015    Past Medical History:  Diagnosis Date  . Anxiety   . Arthritis   . Back pain   . Bulging of cervical intervertebral disc   . CAD (coronary artery disease)    lateral STEMI 02/06/2015 00% D1 occlusion treated with Promus Premier 2.5 mm x 16 mm DES, 70% ramus stenosis, 40% mid RCA stenosis, 45% distal RCA stenosis, EF 45-50%  . CHF (congestive heart failure) ([place])   . COPD (chronic obstructive pulmonary disease) ([place])   . Depression   . Diabetes mellitus without complication ([place])   . Dyspnea   . GERD (gastroesophageal reflux disease)   . Headache   . Hip pain   . MI (myocardial infarction)   . Neck pain   . Otitis media   . Pleurisy   . Sinus pause    9 sec sinus pause on telemetry after started on coreg after MI, avoid AV nodal blocking agent    Past Surgical History:  Procedure Laterality Date  . CARDIAC CATHETERIZATION N/A 02/06/2015   Procedure: Left Heart Cath and Coronary Angiography;  Surgeon: Leonie Man, MD;  Location: [facility];  Service: Cardiovascular;  Laterality: N/A;  . CARDIAC CATHETERIZATION N/A 02/06/2015   Procedure: Coronary Stent Intervention;  Surgeon: Leonie Man, MD;  Location: [facility];  Service: Cardiovascular;  Laterality: N/A;  . CORONARY ANGIOPLASTY WITH STENT PLACEMENT    . INCISION / DRAINAGE HAND / FINGER    . TOTAL HIP ARTHROPLASTY Right 07/01/2016  . TOTAL HIP ARTHROPLASTY Right 07/01/2016   Procedure: RIGHT TOTAL HIP ARTHROPLASTY ANTERIOR APPROACH;  Surgeon: Mcarthur Rossetti, MD;  Location: [facility];  Service: Orthopedics;  Laterality: Right;    Family History  Problem Relation Age of Onset  . Heart attack Father   . Stroke Father   . Cancer Mother     ???    Social History   Social History  . Marital status: Divorced    Spouse name: N/A  . Number of children: N/A  . Years of education: N/A   Occupational History  . Not on file.   Social History Main Topics  . Smoking status: Current Some Day Smoker    Packs/day: 0.50    Years: 46.00    Types: Cigarettes    Start date: 06/07/1979  . Smokeless tobacco: Former Systems developer    Types: Chew     Comment: plans to get patches  . Alcohol use 3.0 - 3.6  oz/week    5 - 6 Shots of liquor per week     Comment: used to abuse etoh. has been drinking 40 years. now drinks on occasion.  . Drug use: No     Comment: history of drug use- marijuana, cocaine  . Sexual activity: Yes    Partners: Female    Birth control/ protection: None   Other Topics Concern  . Not on file   Social History Narrative  . No narrative on file      ROS:  General: Negative for  weight loss, fever, chills, fatigue, weakness. Eyes: Negative for vision changes.  ENT: Negative for hoarseness, difficulty swallowing , nasal congestion. CV: Negative for chest pain, angina, palpitations, dyspnea on exertion, peripheral edema.  Respiratory: Negative for dyspnea at rest, dyspnea on exertion, cough, sputum, wheezing.  GI: See history of present illness. GU:  Negative for dysuria, hematuria, urinary incontinence, urinary frequency, nocturnal urination.  MS: Positive for joint pain, low back pain.  Derm: Negative for rash or itching.  Neuro: Negative for weakness, abnormal sensation, seizure, frequent headaches, memory loss, confusion.  Psych: Negative for anxiety, depression, suicidal ideation, hallucinations.  Endo: Negative for unusual weight change.  Heme: Negative for bruising or bleeding. Allergy: Negative for rash or hives.     Physical Examination:  BP (!) 146/96   Pulse (!) 104   Ht '5\' 5"'$  (1.651 m)   Wt 224 lb 6.4 oz (101.8 kg)   BMI 37.34 kg/m    General: Well-nourished, well-developed in no acute distress.  Head: Normocephalic, atraumatic.   Eyes: Conjunctiva pink, no icterus. Mouth: Oropharyngeal mucosa moist and pink , no lesions erythema or exudate. Neck: Supple without thyromegaly, masses, or lymphadenopathy.  Lungs: Clear to auscultation bilaterally.  Heart: Regular rate and rhythm, no murmurs rubs or gallops.  Abdomen: Bowel sounds are normal, protuberant abdomen with rectus diastases no fluid wave, mild epigastric tenderness, no hepatosplenomegaly or masses, no abdominal bruits or    hernia , no rebound or guarding.   Rectal: Not performed Extremities: Trace lower extremity edema. No clubbing or deformities.  Neuro: Alert and oriented x 4 , grossly normal neurologically.  Skin: Warm and dry, no rash or jaundice.   Psych: Alert and cooperative, normal mood and affect.  Labs: Lab Results  Component Value Date   HGBA1C 6.3 (H) 08/05/2016   Lab Results  Component Value Date   CREATININE 0.76 08/05/2016   BUN 12 08/05/2016   NA 141 08/05/2016   K 4.8 08/05/2016   CL 103 08/05/2016   CO2 32 (H) 08/05/2016   Lab Results  Component Value Date   ALT 30 08/05/2016   AST 18 08/05/2016   ALKPHOS 119 (H) 08/05/2016   BILITOT 0.3 08/05/2016   Lab Results  Component Value Date   PSA 0.3 08/05/2016   PSA 0.63 09/27/2015   Lab Results  Component Value Date   WBC 11.7 (H) 07/02/2016   HGB 12.8 (L) 07/02/2016   HCT 39.2 07/02/2016   MCV 84.3 07/02/2016   PLT 237 07/02/2016     Imaging Studies: Dg Lumbar Spine Complete  Result Date: 09/12/2016 CLINICAL DATA:  Chronic low back pain radiating into both legs. No known injury. EXAM: LUMBAR SPINE - COMPLETE 4+ VIEW COMPARISON:  None. FINDINGS: No fracture or malalignment. Advanced facet degenerative change L4-5 and L5-S1 is identified.  Loss of disc space height is most notable at L3-4 and L5-S1. Paraspinous structures demonstrate atherosclerosis. Right hip replacement is noted. IMPRESSION: No  acute abnormality. Lower lumbar spondylosis as described. Atherosclerosis. Electronically Signed   By: Inge Rise M.D.   On: 09/12/2016 11:15    Impression/plan:  51 year old gentleman presenting with multiple GI complaints. History of colitis last year with CT findings as outlined above. For the most part has recovered from that episode but has intermittent issues with his bowel habits. Intermittent diarrhea although sounds self-limiting, mostly normal stools. Continues to have abdominal cramping and nervous stomach. He also complains of nocturnal reflux, tries to control with diet and Pepcid. He has early satiety with decreased appetite and intermittent epigastric pain in the setting of aspirin and Motrin use. Also with history of significant alcohol abuse in the past although he does not claim at this time. Would offer him a colonoscopy and upper endoscopy for further evaluation of his symptoms. Plan for deep sedation in the OR given history of alcohol use.  I have discussed the risks, alternatives, benefits with regards to but not limited to the risk of reaction to medication, bleeding, infection, perforation and the patient is agreeable to proceed. Written consent to be obtained.  FYI, patient reports episode of angioedema to lisinopril around 2016 requiring intubation and mechanical ventilation. He continues to see ENT for what sounds like vocal cord issues

## 2016-10-02 NOTE — Patient Instructions (Signed)
BHAVIN MONJARAZ  10/02/2016     '@[email or web address]'$ @   Your procedure is scheduled on  10/09/2016   Report to [facility] at  78  A.M.  Call this number if you have problems the morning of surgery:  [phone]   Remember:  Do not eat food or drink liquids after midnight.  Take these medicines the morning of surgery with A SIP OF WATER  Norvasc, celexa, imdur. Take your inhaler and your nebulizer before you come. DO NOT take any medicines for diabetes the morning of your procedure.   Do not wear jewelry, make-up or nail polish.  Do not wear lotions, powders, or perfumes, or deoderant.  Do not shave 48 hours prior to surgery.  Men may shave face and neck.  Do not bring valuables to the hospital.  [facility] is not responsible for any belongings or valuables.  Contacts, dentures or bridgework may not be worn into surgery.  Leave your suitcase in the car.  After surgery it may be brought to your room.  For patients admitted to the hospital, discharge time will be determined by your treatment team.  Patients discharged the day of surgery will not be allowed to drive home.   Name and phone number of your driver:   family Special instructions:  Follow the diet and prep instructions given to you by Dr Roseanne Kaufman office.  Please read over the following fact sheets that you were given. Anesthesia Post-op Instructions and Care and Recovery After Surgery       Esophagogastroduodenoscopy Introduction Esophagogastroduodenoscopy (EGD) is a procedure to examine the lining of the esophagus, stomach, and first part of the small intestine (duodenum). This procedure is done to check for problems such as inflammation, bleeding, ulcers, or growths. During this procedure, a long, flexible, lighted tube with a camera attached (endoscope) is inserted down the throat. Tell a health care provider about:  Any allergies you have.  All medicines you are taking, including  vitamins, herbs, eye drops, creams, and over-the-counter medicines.  Any problems you or family members have had with anesthetic medicines.  Any blood disorders you have.  Any surgeries you have had.  Any medical conditions you have.  Whether you are pregnant or may be pregnant. What are the risks? Generally, this is a safe procedure. However, problems may occur, including:  Infection.  Bleeding.  A tear (perforation) in the esophagus, stomach, or duodenum.  Trouble breathing.  Excessive sweating.  Spasms of the larynx.  A slowed heartbeat.  Low blood pressure. What happens before the procedure?  Follow instructions from your health care provider about eating or drinking restrictions.  Ask your health care provider about:  Changing or stopping your regular medicines. This is especially important if you are taking diabetes medicines or blood thinners.  Taking medicines such as aspirin and ibuprofen. These medicines can thin your blood. Do not take these medicines before your procedure if your health care provider instructs you not to.  Plan to have someone take you home after the procedure.  If you wear dentures, be ready to remove them before the procedure. What happens during the procedure?  To reduce your risk of infection, your health care team will wash or sanitize their hands.  An IV tube will be put in a vein in your hand or arm. You will get medicines and fluids through this tube.  You will be given one or  more of the following:  A medicine to help you relax (sedative).  A medicine to numb the area (local anesthetic). This medicine may be sprayed into your throat. It will make you feel more comfortable and keep you from gagging or coughing during the procedure.  A medicine for pain.  A mouth guard may be placed in your mouth to protect your teeth and to keep you from biting on the endoscope.  You will be asked to lie on your left side.  The endoscope  will be lowered down your throat into your esophagus, stomach, and duodenum.  Air will be put into the endoscope. This will help your health care provider see better.  The lining of your esophagus, stomach, and duodenum will be examined.  Your health care provider may:  Take a tissue sample so it can be looked at in a lab (biopsy).  Remove growths.  Remove objects (foreign bodies) that are stuck.  Treat any bleeding with medicines or other devices that stop tissue from bleeding.  Widen (dilate) or stretch narrowed areas of your esophagus and stomach.  The endoscope will be taken out. The procedure may vary among health care providers and hospitals. What happens after the procedure?  Your blood pressure, heart rate, breathing rate, and blood oxygen level will be monitored often until the medicines you were given have worn off.  Do not eat or drink anything until the numbing medicine has worn off and your gag reflex has returned. This information is not intended to replace advice given to you by your health care provider. Make sure you discuss any questions you have with your health care provider. Document Released: 12/19/2004 Document Revised: 01/24/2016 Document Reviewed: 07/12/2015  2017 Elsevier Esophagogastroduodenoscopy, Care After Introduction Refer to this sheet in the next few weeks. These instructions provide you with information about caring for yourself after your procedure. Your health care provider may also give you more specific instructions. Your treatment has been planned according to current medical practices, but problems sometimes occur. Call your health care provider if you have any problems or questions after your procedure. What can I expect after the procedure? After the procedure, it is common to have:  A sore throat.  Nausea.  Bloating.  Dizziness.  Fatigue. Follow these instructions at home:  Do not eat or drink anything until the numbing  medicine (local anesthetic) has worn off and your gag reflex has returned. You will know that the local anesthetic has worn off when you can swallow comfortably.  Do not drive for 24 hours if you received a medicine to help you relax (sedative).  If your health care provider took a tissue sample for testing during the procedure, make sure to get your test results. This is your responsibility. Ask your health care provider or the department performing the test when your results will be ready.  Keep all follow-up visits as told by your health care provider. This is important. Contact a health care provider if:  You cannot stop coughing.  You are not urinating.  You are urinating less than usual. Get help right away if:  You have trouble swallowing.  You cannot eat or drink.  You have throat or chest pain that gets worse.  You are dizzy or light-headed.  You faint.  You have nausea or vomiting.  You have chills.  You have a fever.  You have severe abdominal pain.  You have black, tarry, or bloody stools. This information is not intended to  replace advice given to you by your health care provider. Make sure you discuss any questions you have with your health care provider. Document Released: 08/04/2012 Document Revised: 01/24/2016 Document Reviewed: 07/12/2015  2017 Elsevier  Colonoscopy, Adult A colonoscopy is an exam to look at the entire large intestine. During the exam, a lubricated, bendable tube is inserted into the anus and then passed into the rectum, colon, and other parts of the large intestine. A colonoscopy is often done as a part of normal colorectal screening or in response to certain symptoms, such as anemia, persistent diarrhea, abdominal pain, and blood in the stool. The exam can help screen for and diagnose medical problems, including:  Tumors.  Polyps.  Inflammation.  Areas of bleeding. Tell a health care provider about:  Any allergies you  have.  All medicines you are taking, including vitamins, herbs, eye drops, creams, and over-the-counter medicines.  Any problems you or family members have had with anesthetic medicines.  Any blood disorders you have.  Any surgeries you have had.  Any medical conditions you have.  Any problems you have had passing stool. What are the risks? Generally, this is a safe procedure. However, problems may occur, including:  Bleeding.  A tear in the intestine.  A reaction to medicines given during the exam.  Infection (rare). What happens before the procedure? Eating and drinking restrictions  Follow instructions from your health care provider about eating and drinking, which may include:  A few days before the procedure - follow a low-fiber diet. Avoid nuts, seeds, dried fruit, raw fruits, and vegetables.  1-3 days before the procedure - follow a clear liquid diet. Drink only clear liquids, such as clear broth or bouillon, black coffee or tea, clear juice, clear soft drinks or sports drinks, gelatin desert, and popsicles. Avoid any liquids that contain red or purple dye.  On the day of the procedure - do not eat or drink anything during the 2 hours before the procedure, or within the time period that your health care provider recommends. Bowel prep  If you were prescribed an oral bowel prep to clean out your colon:  Take it as told by your health care provider. Starting the day before your procedure, you will need to drink a large amount of medicated liquid. The liquid will cause you to have multiple loose stools until your stool is almost clear or light green.  If your skin or anus gets irritated from diarrhea, you may use these to relieve the irritation:  Medicated wipes, such as adult wet wipes with aloe and vitamin E.  A skin soothing-product like petroleum jelly.  If you vomit while drinking the bowel prep, take a break for up to 60 minutes and then begin the bowel prep  again. If vomiting continues and you cannot take the bowel prep without vomiting, call your health care provider. General instructions  Ask your health care provider about changing or stopping your regular medicines. This is especially important if you are taking diabetes medicines or blood thinners.  Plan to have someone take you home from the hospital or clinic. What happens during the procedure?  An IV tube may be inserted into one of your veins.  You will be given medicine to help you relax (sedative).  To reduce your risk of infection:  Your health care team will wash or sanitize their hands.  Your anal area will be washed with soap.  You will be asked to lie on your side with  your knees bent.  Your health care provider will lubricate a long, thin, flexible tube. The tube will have a camera and a light on the end.  The tube will be inserted into your anus.  The tube will be gently eased through your rectum and colon.  Air will be delivered into your colon to keep it open. You may feel some pressure or cramping.  The camera will be used to take images during the procedure.  A small tissue sample may be removed from your body to be examined under a microscope (biopsy). If any potential problems are found, the tissue will be sent to a lab for testing.  If small polyps are found, your health care provider may remove them and have them checked for cancer cells.  The tube that was inserted into your anus will be slowly removed. The procedure may vary among health care providers and hospitals. What happens after the procedure?  Your blood pressure, heart rate, breathing rate, and blood oxygen level will be monitored until the medicines you were given have worn off.  Do not drive for 24 hours after the exam.  You may have a small amount of blood in your stool.  You may pass gas and have mild abdominal cramping or bloating due to the air that was used to inflate your colon  during the exam.  It is up to you to get the results of your procedure. Ask your health care provider, or the department performing the procedure, when your results will be ready. This information is not intended to replace advice given to you by your health care provider. Make sure you discuss any questions you have with your health care provider. Document Released: 08/15/2000 Document Revised: 03/07/2016 Document Reviewed: 10/30/2015 Elsevier Interactive Patient Education  2017 [employer]  Colonoscopy, Adult, Care After This sheet gives you information about how to care for yourself after your procedure. Your health care provider may also give you more specific instructions. If you have problems or questions, contact your health care provider. What can I expect after the procedure? After the procedure, it is common to have:  A small amount of blood in your stool for 24 hours after the procedure.  Some gas.  Mild abdominal cramping or bloating. Follow these instructions at home: General instructions  For the first 24 hours after the procedure:  Do not drive or use machinery.  Do not sign important documents.  Do not drink alcohol.  Do your regular daily activities at a slower pace than normal.  Eat soft, easy-to-digest foods.  Rest often.  Take over-the-counter or prescription medicines only as told by your health care provider.  It is up to you to get the results of your procedure. Ask your health care provider, or the department performing the procedure, when your results will be ready. Relieving cramping and bloating  Try walking around when you have cramps or feel bloated.  Apply heat to your abdomen as told by your health care provider. Use a heat source that your health care provider recommends, such as a moist heat pack or a heating pad.  Place a towel between your skin and the heat source.  Leave the heat on for 20-30 minutes.  Remove the heat if your skin  turns bright red. This is especially important if you are unable to feel pain, heat, or cold. You may have a greater risk of getting burned. Eating and drinking  Drink enough fluid to keep your urine  clear or pale yellow.  Resume your normal diet as instructed by your health care provider. Avoid heavy or fried foods that are hard to digest.  Avoid drinking alcohol for as long as instructed by your health care provider. Contact a health care provider if:  You have blood in your stool 2-3 days after the procedure. Get help right away if:  You have more than a small spotting of blood in your stool.  You pass large blood clots in your stool.  Your abdomen is swollen.  You have nausea or vomiting.  You have a fever.  You have increasing abdominal pain that is not relieved with medicine. This information is not intended to replace advice given to you by your health care provider. Make sure you discuss any questions you have with your health care provider. Document Released: 04/01/2004 Document Revised: 05/12/2016 Document Reviewed: 10/30/2015 Elsevier Interactive Patient Education  2017 [place] Anesthesia is a term that refers to techniques, procedures, and medicines that help a person stay safe and comfortable during a medical procedure. Monitored anesthesia care, or sedation, is one type of anesthesia. Your anesthesia specialist may recommend sedation if you will be having a procedure that does not require you to be unconscious, such as:  Cataract surgery.  A dental procedure.  A biopsy.  A colonoscopy. During the procedure, you may receive a medicine to help you relax (sedative). There are three levels of sedation:  Mild sedation. At this level, you may feel awake and relaxed. You will be able to follow directions.  Moderate sedation. At this level, you will be sleepy. You may not remember the procedure.  Deep sedation. At this level, you  will be asleep. You will not remember the procedure. The more medicine you are given, the deeper your level of sedation will be. Depending on how you respond to the procedure, the anesthesia specialist may change your level of sedation or the type of anesthesia to fit your needs. An anesthesia specialist will monitor you closely during the procedure. Let your health care provider know about:  Any allergies you have.  All medicines you are taking, including vitamins, herbs, eye drops, creams, and over-the-counter medicines.  Any use of steroids (by mouth or as a cream).  Any problems you or family members have had with sedatives and anesthetic medicines.  Any blood disorders you have.  Any surgeries you have had.  Any medical conditions you have, such as sleep apnea.  Whether you are pregnant or may be pregnant.  Any use of cigarettes, alcohol, or street drugs. What are the risks? Generally, this is a safe procedure. However, problems may occur, including:  Getting too much medicine (oversedation).  Nausea.  Allergic reaction to medicines.  Trouble breathing. If this happens, a breathing tube may be used to help with breathing. It will be removed when you are awake and breathing on your own.  Heart trouble.  Lung trouble. Before the procedure Staying hydrated  Follow instructions from your health care provider about hydration, which may include:  Up to 2 hours before the procedure - you may continue to drink clear liquids, such as water, clear fruit juice, black coffee, and plain tea. Eating and drinking restrictions  Follow instructions from your health care provider about eating and drinking, which may include:  8 hours before the procedure - stop eating heavy meals or foods such as meat, fried foods, or fatty foods.  6 hours before the procedure -  stop eating light meals or foods, such as toast or cereal.  6 hours before the procedure - stop drinking milk or drinks  that contain milk.  2 hours before the procedure - stop drinking clear liquids. Medicines  Ask your health care provider about:  Changing or stopping your regular medicines. This is especially important if you are taking diabetes medicines or blood thinners.  Taking medicines such as aspirin and ibuprofen. These medicines can thin your blood. Do not take these medicines before your procedure if your health care provider instructs you not to. Tests and exams  You will have a physical exam.  You may have blood tests done to show:  How well your kidneys and liver are working.  How well your blood can clot.  General instructions  Plan to have someone take you home from the hospital or clinic.  If you will be going home right after the procedure, plan to have someone with you for 24 hours. What happens during the procedure?  Your blood pressure, heart rate, breathing, level of pain and overall condition will be monitored.  An IV tube will be inserted into one of your veins.  Your anesthesia specialist will give you medicines as needed to keep you comfortable during the procedure. This may mean changing the level of sedation.  The procedure will be performed. After the procedure  Your blood pressure, heart rate, breathing rate, and blood oxygen level will be monitored until the medicines you were given have worn off.  Do not drive for 24 hours if you received a sedative.  You may:  Feel sleepy, clumsy, or nauseous.  Feel forgetful about what happened after the procedure.  Have a sore throat if you had a breathing tube during the procedure.  Vomit. This information is not intended to replace advice given to you by your health care provider. Make sure you discuss any questions you have with your health care provider. Document Released: 05/14/2005 Document Revised: 01/25/2016 Document Reviewed: 12/09/2015 Elsevier Interactive Patient Education  2017 [place], Care After These instructions provide you with information about caring for yourself after your procedure. Your health care provider may also give you more specific instructions. Your treatment has been planned according to current medical practices, but problems sometimes occur. Call your health care provider if you have any problems or questions after your procedure. What can I expect after the procedure? After your procedure, it is common to:  Feel sleepy for several hours.  Feel clumsy and have poor balance for several hours.  Feel forgetful about what happened after the procedure.  Have poor judgment for several hours.  Feel nauseous or vomit.  Have a sore throat if you had a breathing tube during the procedure. Follow these instructions at home: For at least 24 hours after the procedure:   Do not:  Participate in activities in which you could fall or become injured.  Drive.  Use heavy machinery.  Drink alcohol.  Take sleeping pills or medicines that cause drowsiness.  Make important decisions or sign legal documents.  Take care of children on your own.  Rest. Eating and drinking  Follow the diet that is recommended by your health care provider.  If you vomit, drink water, juice, or soup when you can drink without vomiting.  Make sure you have little or no nausea before eating solid foods. General instructions  Have a responsible adult stay with you until you are awake and alert.  Take over-the-counter and prescription medicines only as told by your health care provider.  If you smoke, do not smoke without supervision.  Keep all follow-up visits as told by your health care provider. This is important. Contact a health care provider if:  You keep feeling nauseous or you keep vomiting.  You feel light-headed.  You develop a rash.  You have a fever. Get help right away if:  You have trouble breathing. This information is not intended  to replace advice given to you by your health care provider. Make sure you discuss any questions you have with your health care provider. Document Released: 12/09/2015 Document Revised: 04/09/2016 Document Reviewed: 12/09/2015 Elsevier Interactive Patient Education  2017 [employer].

## 2016-10-06 ENCOUNTER — Encounter (HOSPITAL_COMMUNITY)
Admission: RE | Admit: 2016-10-06 | Discharge: 2016-10-06 | Disposition: A | Discharge status: Home / Self Care | Payer: Medicaid Other | Source: Ambulatory Visit | Attending: Internal Medicine | Admitting: Internal Medicine

## 2016-10-06 ENCOUNTER — Other Ambulatory Visit: Payer: Self-pay | Admitting: Physician Assistant

## 2016-10-06 ENCOUNTER — Encounter (HOSPITAL_COMMUNITY): Payer: Self-pay

## 2016-10-06 ENCOUNTER — Other Ambulatory Visit: Payer: Self-pay | Admitting: Cardiology

## 2016-10-06 DIAGNOSIS — Z01812 Encounter for preprocedural laboratory examination: Secondary | ICD-10-CM | POA: Diagnosis not present

## 2016-10-06 HISTORY — DX: Failed or difficult intubation, initial encounter: T88.4XXA

## 2016-10-06 LAB — BASIC METABOLIC PANEL
ANION GAP: 12 (ref 5–15)
BUN: 9 mg/dL (ref 6–20)
CHLORIDE: 97 mmol/L — AB (ref 101–111)
CO2: 27 mmol/L (ref 22–32)
Calcium: 9.3 mg/dL (ref 8.9–10.3)
Creatinine, Ser: 0.79 mg/dL (ref 0.61–1.24)
GFR calc Af Amer: >60 mL/min (ref >60)
GFR calc non Af Amer: >60 mL/min (ref >60)
Glucose, Bld: 195 mg/dL — ABNORMAL HIGH (ref 65–99)
POTASSIUM: 3.6 mmol/L (ref 3.5–5.1)
Sodium: 136 mmol/L (ref 135–145)

## 2016-10-06 LAB — CBC WITH DIFFERENTIAL/PLATELET
Basophils Absolute: 0 10*3/uL (ref 0.0–0.1)
Basophils Relative: 0 %
EOS ABS: 0.3 10*3/uL (ref 0.0–0.7)
Eosinophils Relative: 3 %
HEMATOCRIT: 47.1 % (ref 39.0–52.0)
HEMOGLOBIN: 15.6 g/dL (ref 13.0–17.0)
LYMPHS ABS: 2.4 10*3/uL (ref 0.7–4.0)
LYMPHS PCT: 22 %
MCH: 27 pg (ref 26.0–34.0)
MCHC: 33.1 g/dL (ref 30.0–36.0)
MCV: 81.6 fL (ref 78.0–100.0)
MONOS PCT: 7 %
Monocytes Absolute: 0.8 10*3/uL (ref 0.1–1.0)
NEUTROS ABS: 7.7 10*3/uL (ref 1.7–7.7)
NEUTROS PCT: 68 %
Platelets: 343 10*3/uL (ref 150–400)
RBC: 5.77 MIL/uL (ref 4.22–5.81)
RDW: 14.4 % (ref 11.5–15.5)
WBC: 11.3 10*3/uL — AB (ref 4.0–10.5)

## 2016-10-06 MED ORDER — AMLODIPINE BESYLATE 10 MG PO TABS: 10.0000 mg | ORAL_TABLET | Freq: Every day | ORAL | 4 refills | Status: DC

## 2016-10-06 MED ORDER — METFORMIN HCL 1000 MG PO TABS: 1000.0000 mg | ORAL_TABLET | Freq: Two times a day (BID) | ORAL | 4 refills | Status: DC

## 2016-10-06 MED ORDER — ISOSORBIDE MONONITRATE ER 30 MG PO TB24: 15.0000 mg | ORAL_TABLET | Freq: Every day | ORAL | 3 refills | Status: DC

## 2016-10-06 NOTE — Telephone Encounter (Signed)
Refilled rx

## 2016-10-06 NOTE — Telephone Encounter (Signed)
1. Which medications need to be refilled? (please list name of each medication and dose if known)  isosorbide mononitrate (IMDUR) 30 MG 24 hr tablet [[id number]]    2. Which pharmacy/location (including street and city if local pharmacy) is medication to be sent to? [place]   3. Do they need a 30 day or 90 day supply?  90 day  He's no longer getting his medication through [facility], please send Rx's to [facility]

## 2016-10-08 ENCOUNTER — Telehealth: Payer: Self-pay

## 2016-10-08 NOTE — Telephone Encounter (Signed)
Time changed for TCS/EGD for tomorrow. Pt to arrive at 9:30am. Start drinking prep in the morning at 6:00am, NPO after 8:00am. LMOVM and informed pt, then called home number and spoke to his friend [name]) and she will inform the pt.

## 2016-10-09 ENCOUNTER — Ambulatory Visit (HOSPITAL_COMMUNITY): Payer: Medicaid Other | Admitting: Anesthesiology

## 2016-10-09 ENCOUNTER — Ambulatory Visit (HOSPITAL_COMMUNITY)
Admission: RE | Admit: 2016-10-09 | Discharge: 2016-10-09 | Disposition: A | Discharge status: Home / Self Care | Payer: Medicaid Other | Source: Ambulatory Visit | Attending: Internal Medicine | Admitting: Internal Medicine

## 2016-10-09 ENCOUNTER — Encounter (HOSPITAL_COMMUNITY)
Admission: RE | Disposition: A | Discharge status: Home / Self Care | Payer: Self-pay | Source: Ambulatory Visit | Attending: Internal Medicine

## 2016-10-09 ENCOUNTER — Encounter (HOSPITAL_COMMUNITY): Payer: Self-pay | Admitting: *Deleted

## 2016-10-09 DIAGNOSIS — Z823 Family history of stroke: Secondary | ICD-10-CM | POA: Diagnosis not present

## 2016-10-09 DIAGNOSIS — Z8249 Family history of ischemic heart disease and other diseases of the circulatory system: Secondary | ICD-10-CM | POA: Insufficient documentation

## 2016-10-09 DIAGNOSIS — R6881 Early satiety: Secondary | ICD-10-CM

## 2016-10-09 DIAGNOSIS — K625 Hemorrhage of anus and rectum: Secondary | ICD-10-CM

## 2016-10-09 DIAGNOSIS — F419 Anxiety disorder, unspecified: Secondary | ICD-10-CM | POA: Diagnosis not present

## 2016-10-09 DIAGNOSIS — K449 Diaphragmatic hernia without obstruction or gangrene: Secondary | ICD-10-CM | POA: Diagnosis not present

## 2016-10-09 DIAGNOSIS — Z9889 Other specified postprocedural states: Secondary | ICD-10-CM | POA: Insufficient documentation

## 2016-10-09 DIAGNOSIS — Z96641 Presence of right artificial hip joint: Secondary | ICD-10-CM | POA: Diagnosis not present

## 2016-10-09 DIAGNOSIS — M47816 Spondylosis without myelopathy or radiculopathy, lumbar region: Secondary | ICD-10-CM | POA: Diagnosis not present

## 2016-10-09 DIAGNOSIS — E119 Type 2 diabetes mellitus without complications: Secondary | ICD-10-CM | POA: Insufficient documentation

## 2016-10-09 DIAGNOSIS — K209 Esophagitis, unspecified: Secondary | ICD-10-CM | POA: Diagnosis not present

## 2016-10-09 DIAGNOSIS — Z7982 Long term (current) use of aspirin: Secondary | ICD-10-CM | POA: Insufficient documentation

## 2016-10-09 DIAGNOSIS — I509 Heart failure, unspecified: Secondary | ICD-10-CM | POA: Insufficient documentation

## 2016-10-09 DIAGNOSIS — Z955 Presence of coronary angioplasty implant and graft: Secondary | ICD-10-CM | POA: Insufficient documentation

## 2016-10-09 DIAGNOSIS — F329 Major depressive disorder, single episode, unspecified: Secondary | ICD-10-CM | POA: Diagnosis not present

## 2016-10-09 DIAGNOSIS — I2510 Atherosclerotic heart disease of native coronary artery without angina pectoris: Secondary | ICD-10-CM | POA: Diagnosis not present

## 2016-10-09 DIAGNOSIS — F458 Other somatoform disorders: Secondary | ICD-10-CM | POA: Insufficient documentation

## 2016-10-09 DIAGNOSIS — Z538 Procedure and treatment not carried out for other reasons: Secondary | ICD-10-CM | POA: Diagnosis not present

## 2016-10-09 DIAGNOSIS — K219 Gastro-esophageal reflux disease without esophagitis: Secondary | ICD-10-CM

## 2016-10-09 DIAGNOSIS — F17210 Nicotine dependence, cigarettes, uncomplicated: Secondary | ICD-10-CM | POA: Diagnosis not present

## 2016-10-09 DIAGNOSIS — K2270 Barrett's esophagus without dysplasia: Secondary | ICD-10-CM | POA: Insufficient documentation

## 2016-10-09 DIAGNOSIS — R1013 Epigastric pain: Secondary | ICD-10-CM | POA: Insufficient documentation

## 2016-10-09 DIAGNOSIS — Z7984 Long term (current) use of oral hypoglycemic drugs: Secondary | ICD-10-CM | POA: Diagnosis not present

## 2016-10-09 DIAGNOSIS — K210 Gastro-esophageal reflux disease with esophagitis: Secondary | ICD-10-CM | POA: Diagnosis not present

## 2016-10-09 DIAGNOSIS — I252 Old myocardial infarction: Secondary | ICD-10-CM | POA: Insufficient documentation

## 2016-10-09 DIAGNOSIS — J449 Chronic obstructive pulmonary disease, unspecified: Secondary | ICD-10-CM | POA: Insufficient documentation

## 2016-10-09 HISTORY — PX: COLONOSCOPY WITH PROPOFOL: SHX5780

## 2016-10-09 HISTORY — PX: ESOPHAGOGASTRODUODENOSCOPY (EGD) WITH PROPOFOL: SHX5813

## 2016-10-09 HISTORY — PX: BIOPSY: SHX5522

## 2016-10-09 LAB — GLUCOSE, CAPILLARY
Glucose-Capillary: 158 mg/dL — ABNORMAL HIGH (ref 65–99)
Glucose-Capillary: 123 mg/dL — ABNORMAL HIGH (ref 65–99)

## 2016-10-09 SURGERY — COLONOSCOPY WITH PROPOFOL
Anesthesia: Monitor Anesthesia Care

## 2016-10-09 MED ORDER — LIDOCAINE VISCOUS 2 % MT SOLN
OROMUCOSAL | Status: AC
  Filled 2016-10-09: qty 15

## 2016-10-09 MED ORDER — LACTATED RINGERS IV SOLN
INTRAVENOUS | Status: DC | PRN
  Administered 2016-10-09: 11:00:00 via INTRAVENOUS

## 2016-10-09 MED ORDER — LACTATED RINGERS IV SOLN
INTRAVENOUS | Status: DC
  Administered 2016-10-09: 1000 mL via INTRAVENOUS

## 2016-10-09 MED ORDER — LIDOCAINE VISCOUS 2 % MT SOLN
5.0000 mL | Freq: Two times a day (BID) | OROMUCOSAL | Status: AC
  Administered 2016-10-09 (×2): 5 mL via OROMUCOSAL

## 2016-10-09 MED ORDER — CHLORHEXIDINE GLUCONATE CLOTH 2 % EX PADS: 6.0000 | MEDICATED_PAD | Freq: Once | CUTANEOUS | Status: DC

## 2016-10-09 MED ORDER — FENTANYL CITRATE (PF) 100 MCG/2ML IJ SOLN
25.0000 ug | INTRAMUSCULAR | Status: AC
  Administered 2016-10-09 (×2): 25 ug via INTRAVENOUS

## 2016-10-09 MED ORDER — PROPOFOL 500 MG/50ML IV EMUL
INTRAVENOUS | Status: DC | PRN
  Administered 2016-10-09: 125 ug/kg/min via INTRAVENOUS

## 2016-10-09 MED ORDER — MIDAZOLAM HCL 2 MG/2ML IJ SOLN
1.0000 mg | INTRAMUSCULAR | Status: AC
  Administered 2016-10-09: 2 mg via INTRAVENOUS

## 2016-10-09 MED ORDER — MIDAZOLAM HCL 2 MG/2ML IJ SOLN
INTRAMUSCULAR | Status: AC
  Filled 2016-10-09: qty 2

## 2016-10-09 MED ORDER — FENTANYL CITRATE (PF) 100 MCG/2ML IJ SOLN
INTRAMUSCULAR | Status: AC
  Filled 2016-10-09: qty 2

## 2016-10-09 NOTE — H&P (View-Only) (Signed)
Primary Care Physician:  Soyla Dryer, PA-C  Primary Gastroenterologist:  Garfield Cornea, MD   Chief Complaint  Patient presents with  . Colonoscopy    HPI:  Randy Hebert is a 51 y.o. male here for consideration of colonoscopy. When we called to schedule his colonoscopy he had multiple GI concerns therefore was offered an office visit. Patient has never had a colonoscopy. No known family history of colon cancer although his mother died of some sort of cancer but is unclear on the details. He reports having colitis last year. CT confirmed in August 2017. Beginning in the proximal descending colon and extending through the hepatic flexure into the ascending colon there was generalized: Wall thickening. Associated mesenteric stranding at the hepatic flexure. Cecum and appendix were normal. TI was normal. Hepatic steatosis noted. Major arterial structures patent.  Patient states his stools are pretty much normal but goes couple days without a BM at a time. No frequent diarrhea. Primary blood on the toilet tissue with wiping at times. No hemorrhoid issues to his knowledge. Continues to have a "nervous stomach". Gets cramps. Does not necessarily have diarrhea with it. Takes Pepcid when necessary for heartburn. Typically gets mostly nocturnal symptoms. No dysphagia. Complains of epigastric discomfort, early satiety which is been going on for several months. Complains of decreased appetite. Some nausea. No vomiting. He was diagnosed with diabetes 1 year ago. Well-controlled on metformin. He takes aspirin daily. Uses Motrin intermittently for headache, aches and pains.   Current Outpatient Prescriptions  Medication Sig Dispense Refill  . albuterol (PROVENTIL HFA;VENTOLIN HFA) 108 (90 Base) MCG/ACT inhaler Inhale 2 puffs into the lungs every 6 (six) hours as needed for wheezing or shortness of breath. 1 Inhaler 4  . albuterol (PROVENTIL) (2.5 MG/3ML) 0.083% nebulizer solution Take 3 mLs (2.5 mg total)  by nebulization every 6 (six) hours as needed for wheezing or shortness of breath. 150 mL 1  . amLODipine (NORVASC) 10 MG tablet Take 1 tablet (10 mg total) by mouth daily. 30 tablet 3  . aspirin EC 81 MG tablet Take 1 tablet (81 mg total) by mouth 2 (two) times daily after a meal. (Patient taking differently: Take 81 mg by mouth daily. ) 60 tablet 0  . atorvastatin (LIPITOR) 20 MG tablet Take 1 tablet (20 mg total) by mouth daily. (Patient taking differently: Take 20 mg by mouth daily at 6 PM. ) 30 tablet 4  . Carboxymeth-Glycerin-Polysorb (REFRESH OPTIVE ADVANCED) 0.5-1-0.5 % SOLN Apply 1 drop to eye daily as needed (dry eyes).     . citalopram (CELEXA) 10 MG tablet Take 10 mg by mouth daily.    . cycloSPORINE (RESTASIS) 0.05 % ophthalmic emulsion Place 1 drop into both eyes 2 (two) times daily.    Marland Kitchen ibuprofen (ADVIL,MOTRIN) 200 MG tablet Take 600 mg by mouth every 6 (six) hours as needed for moderate pain.    . isosorbide mononitrate (IMDUR) 30 MG 24 hr tablet Take 0.5 tablets (15 mg total) by mouth daily. 45 tablet 3  . Menthol, Topical Analgesic, (BENGAY EX) Apply 1 application topically at bedtime.     . metFORMIN (GLUCOPHAGE) 1000 MG tablet Take 1 tablet (1,000 mg total) by mouth 2 (two) times daily with a meal. 60 tablet 3  . mometasone-formoterol (DULERA) 100-5 MCG/ACT AERO Inhale 2 puffs into the lungs 2 (two) times daily. 1 Inhaler 4  . nicotine (NICODERM CQ - DOSED IN MG/24 HOURS) 21 mg/24hr patch Place 21 mg onto the skin daily.    Marland Kitchen  nicotine (NICODERM CQ) 7 mg/24hr patch Place 1 patch (7 mg total) onto the skin daily. 28 patch 1  . nitroGLYCERIN (NITROSTAT) 0.4 MG SL tablet Place 1 tablet (0.4 mg total) under the tongue every 5 (five) minutes as needed for chest pain. 25 tablet 3  . Olopatadine HCl (PAZEO) 0.7 % SOLN Apply 1 drop to eye daily.    . Omega-3 Fatty Acids (FISH OIL PO) Take 1,200 mg by mouth 2 (two) times daily.    Marland Kitchen oxymetazoline (AFRIN) 0.05 % nasal spray Place 1 spray  into both nostrils 2 (two) times daily as needed for congestion.    . traZODone (DESYREL) 100 MG tablet Take 100 mg by mouth at bedtime.     No current facility-administered medications for this visit.     Allergies as of 09/23/2016 - Review Complete 09/23/2016  Allergen Reaction Noted  . Carvedilol Other (See Comments) 02/08/2015  . Lisinopril Anaphylaxis, Shortness Of Breath, and Swelling 05/30/2015  . Amoxicillin Nausea And Vomiting 02/06/2015    Past Medical History:  Diagnosis Date  . Anxiety   . Arthritis   . Back pain   . Bulging of cervical intervertebral disc   . CAD (coronary artery disease)    lateral STEMI 02/06/2015 00% D1 occlusion treated with Promus Premier 2.5 mm x 16 mm DES, 70% ramus stenosis, 40% mid RCA stenosis, 45% distal RCA stenosis, EF 45-50%  . CHF (congestive heart failure) ([place])   . COPD (chronic obstructive pulmonary disease) ([place])   . Depression   . Diabetes mellitus without complication ([place])   . Dyspnea   . GERD (gastroesophageal reflux disease)   . Headache   . Hip pain   . MI (myocardial infarction)   . Neck pain   . Otitis media   . Pleurisy   . Sinus pause    9 sec sinus pause on telemetry after started on coreg after MI, avoid AV nodal blocking agent    Past Surgical History:  Procedure Laterality Date  . CARDIAC CATHETERIZATION N/A 02/06/2015   Procedure: Left Heart Cath and Coronary Angiography;  Surgeon: Leonie Man, MD;  Location: [facility];  Service: Cardiovascular;  Laterality: N/A;  . CARDIAC CATHETERIZATION N/A 02/06/2015   Procedure: Coronary Stent Intervention;  Surgeon: Leonie Man, MD;  Location: [facility];  Service: Cardiovascular;  Laterality: N/A;  . CORONARY ANGIOPLASTY WITH STENT PLACEMENT    . INCISION / DRAINAGE HAND / FINGER    . TOTAL HIP ARTHROPLASTY Right 07/01/2016  . TOTAL HIP ARTHROPLASTY Right 07/01/2016   Procedure: RIGHT TOTAL HIP ARTHROPLASTY ANTERIOR APPROACH;  Surgeon: Mcarthur Rossetti, MD;  Location: [facility];  Service: Orthopedics;  Laterality: Right;    Family History  Problem Relation Age of Onset  . Heart attack Father   . Stroke Father   . Cancer Mother     ???    Social History   Social History  . Marital status: Divorced    Spouse name: N/A  . Number of children: N/A  . Years of education: N/A   Occupational History  . Not on file.   Social History Main Topics  . Smoking status: Current Some Day Smoker    Packs/day: 0.50    Years: 46.00    Types: Cigarettes    Start date: 06/07/1979  . Smokeless tobacco: Former Systems developer    Types: Chew     Comment: plans to get patches  . Alcohol use 3.0 - 3.6  oz/week    5 - 6 Shots of liquor per week     Comment: used to abuse etoh. has been drinking 40 years. now drinks on occasion.  . Drug use: No     Comment: history of drug use- marijuana, cocaine  . Sexual activity: Yes    Partners: Female    Birth control/ protection: None   Other Topics Concern  . Not on file   Social History Narrative  . No narrative on file      ROS:  General: Negative for  weight loss, fever, chills, fatigue, weakness. Eyes: Negative for vision changes.  ENT: Negative for hoarseness, difficulty swallowing , nasal congestion. CV: Negative for chest pain, angina, palpitations, dyspnea on exertion, peripheral edema.  Respiratory: Negative for dyspnea at rest, dyspnea on exertion, cough, sputum, wheezing.  GI: See history of present illness. GU:  Negative for dysuria, hematuria, urinary incontinence, urinary frequency, nocturnal urination.  MS: Positive for joint pain, low back pain.  Derm: Negative for rash or itching.  Neuro: Negative for weakness, abnormal sensation, seizure, frequent headaches, memory loss, confusion.  Psych: Negative for anxiety, depression, suicidal ideation, hallucinations.  Endo: Negative for unusual weight change.  Heme: Negative for bruising or bleeding. Allergy: Negative for rash or hives.     Physical Examination:  BP (!) 146/96   Pulse (!) 104   Ht '5\' 5"'$  (1.651 m)   Wt 224 lb 6.4 oz (101.8 kg)   BMI 37.34 kg/m    General: Well-nourished, well-developed in no acute distress.  Head: Normocephalic, atraumatic.   Eyes: Conjunctiva pink, no icterus. Mouth: Oropharyngeal mucosa moist and pink , no lesions erythema or exudate. Neck: Supple without thyromegaly, masses, or lymphadenopathy.  Lungs: Clear to auscultation bilaterally.  Heart: Regular rate and rhythm, no murmurs rubs or gallops.  Abdomen: Bowel sounds are normal, protuberant abdomen with rectus diastases no fluid wave, mild epigastric tenderness, no hepatosplenomegaly or masses, no abdominal bruits or    hernia , no rebound or guarding.   Rectal: Not performed Extremities: Trace lower extremity edema. No clubbing or deformities.  Neuro: Alert and oriented x 4 , grossly normal neurologically.  Skin: Warm and dry, no rash or jaundice.   Psych: Alert and cooperative, normal mood and affect.  Labs: Lab Results  Component Value Date   HGBA1C 6.3 (H) 08/05/2016   Lab Results  Component Value Date   CREATININE 0.76 08/05/2016   BUN 12 08/05/2016   NA 141 08/05/2016   K 4.8 08/05/2016   CL 103 08/05/2016   CO2 32 (H) 08/05/2016   Lab Results  Component Value Date   ALT 30 08/05/2016   AST 18 08/05/2016   ALKPHOS 119 (H) 08/05/2016   BILITOT 0.3 08/05/2016   Lab Results  Component Value Date   PSA 0.3 08/05/2016   PSA 0.63 09/27/2015   Lab Results  Component Value Date   WBC 11.7 (H) 07/02/2016   HGB 12.8 (L) 07/02/2016   HCT 39.2 07/02/2016   MCV 84.3 07/02/2016   PLT 237 07/02/2016     Imaging Studies: Dg Lumbar Spine Complete  Result Date: 09/12/2016 CLINICAL DATA:  Chronic low back pain radiating into both legs. No known injury. EXAM: LUMBAR SPINE - COMPLETE 4+ VIEW COMPARISON:  None. FINDINGS: No fracture or malalignment. Advanced facet degenerative change L4-5 and L5-S1 is identified.  Loss of disc space height is most notable at L3-4 and L5-S1. Paraspinous structures demonstrate atherosclerosis. Right hip replacement is noted. IMPRESSION: No  acute abnormality. Lower lumbar spondylosis as described. Atherosclerosis. Electronically Signed   By: Inge Rise M.D.   On: 09/12/2016 11:15    Impression/plan:  51 year old gentleman presenting with multiple GI complaints. History of colitis last year with CT findings as outlined above. For the most part has recovered from that episode but has intermittent issues with his bowel habits. Intermittent diarrhea although sounds self-limiting, mostly normal stools. Continues to have abdominal cramping and nervous stomach. He also complains of nocturnal reflux, tries to control with diet and Pepcid. He has early satiety with decreased appetite and intermittent epigastric pain in the setting of aspirin and Motrin use. Also with history of significant alcohol abuse in the past although he does not claim at this time. Would offer him a colonoscopy and upper endoscopy for further evaluation of his symptoms. Plan for deep sedation in the OR given history of alcohol use.  I have discussed the risks, alternatives, benefits with regards to but not limited to the risk of reaction to medication, bleeding, infection, perforation and the patient is agreeable to proceed. Written consent to be obtained.  FYI, patient reports episode of angioedema to lisinopril around 2016 requiring intubation and mechanical ventilation. He continues to see ENT for what sounds like vocal cord issues

## 2016-10-09 NOTE — Transfer of Care (Signed)
Immediate Anesthesia Transfer of Care Note  Patient: Randy Hebert  Procedure(s) Performed: Procedure(s) with comments: COLONOSCOPY WITH PROPOFOL (N/A) - 12:00 pm ESOPHAGOGASTRODUODENOSCOPY (EGD) WITH PROPOFOL (N/A) BIOPSY  Patient Location: PACU  Anesthesia Type:MAC  Level of Consciousness: awake, alert , oriented and patient cooperative  Airway & Oxygen Therapy: Patient Spontanous Breathing and Patient connected to face mask oxygen  Post-op Assessment: Report given to RN and Post -op Vital signs reviewed and stable  Post vital signs: Reviewed and stable  Last Vitals:  Vitals:   10/09/16 1100 10/09/16 1105  BP: 127/79 135/76  Pulse:    Resp: (!) 29 (!) 29  Temp:      Last Pain:  Vitals:   10/09/16 0944  TempSrc: Oral  PainSc: 3       Patients Stated Pain Goal: 9 (94/80/16 5537)  Complications: No apparent anesthesia complications

## 2016-10-09 NOTE — Progress Notes (Signed)
Attempted colonoscopy incomplete prep

## 2016-10-09 NOTE — Op Note (Addendum)
[facility] Patient Name: Randy Hebert Procedure Date: 10/09/2016 10:37 AM MRN: [id number] Date of Birth: 01/06/1966 Attending MD: Norvel Richards , MD CSN: [id number] Age: 51 Admit Type: Outpatient Procedure:                Upper GI endoscopy Indications:              Epigastric abdominal pain Providers:                Norvel Richards, MD, Hinton Rao, RN, Aram Candela Referring MD:             Soyla Dryer, PA Medicines:                Propofol per Anesthesia Complications:            No immediate complications. Estimated Blood Loss:     Estimated blood loss was minimal. Procedure:                Pre-Anesthesia Assessment:                           - Prior to the procedure, a History and Physical                            was performed, and patient medications and                            allergies were reviewed. The patient's tolerance of                            previous anesthesia was also reviewed. The risks                            and benefits of the procedure and the sedation                            options and risks were discussed with the patient.                            All questions were answered, and informed consent                            was obtained. Prior Anticoagulants: The patient has                            taken no previous anticoagulant or antiplatelet                            agents. ASA Grade Assessment: III - A patient with                            severe systemic disease. After reviewing the risks  and benefits, the patient was deemed in                            satisfactory condition to undergo the procedure.                           After obtaining informed consent, the endoscope was                            passed under direct vision. Throughout the                            procedure, the patient's blood pressure, pulse, and   oxygen saturations were monitored continuously. The                            [id number]-[id number] ([id number]) scope was introduced through the                            and advanced to the second part of duodenum. The                            upper GI endoscopy was accomplished without                            difficulty. The patient tolerated the procedure                            well. Scope In: 11:20:02 AM Scope Out: 11:27:26 AM Total Procedure Duration: 0 hours 7 minutes 24 seconds  Findings:      LA Grade A (one or more mucosal breaks less than 5 mm, not extending       between tops of 2 mucosal folds) esophagitis was found 35 to 36 cm from       the incisors. 2 salmon-colored tongues of epithelium coming up from the       GE junction. Maximum distance 3 cm. No nodularity.      A small hiatal hernia was present. Stomach otherwise normal.      The duodenal bulb and second portion of the duodenum were normal.       Abnormal distal esopha biopsied with cold forceps for histology.       Estimated blood loss was minimal Impression:               - LA Grade A esophagitis. Changes suspicious fro                            Barrett's - Biopsied.                           - Small hiatal hernia.                           - Normal duodenal bulb and second portion of the  duodenum. Moderate Sedation:      Moderate (conscious) sedation was personally administered by an       anesthesia professional. The following parameters were monitored: oxygen       saturation, heart rate, blood pressure, respiratory rate, EKG, adequacy       of pulmonary ventilation, and response to care. Total physician       intraservice time was 14 minutes. Recommendation:           - Patient has a contact number available for                            emergencies. The signs and symptoms of potential                            delayed complications were discussed with the                             patient. Return to normal activities tomorrow.                            Written discharge instructions were provided to the                            patient.                           - Resume previous diet.                           - Continue present medications. Begin Dexilant 60                            mg daily                           - Repeat upper endoscopy after studies are complete                            for surveillance based on pathology results.                           - Return to GI clinic in 4 weeks. Procedure Code(s):        --- Professional ---                           [id number], Esophagogastroduodenoscopy, flexible,                            transoral; with biopsy, single or multiple Diagnosis Code(s):        --- Professional ---                           K20.9, Esophagitis, unspecified                           K44.9, Diaphragmatic hernia without obstruction or  gangrene                           R10.13, Epigastric pain CPT copyright 2016 [employer]. All rights reserved. The codes documented in this report are preliminary and upon coder review may  be revised to meet current compliance requirements. Cristopher Estimable. Kamillah Didonato, MD Norvel Richards, MD 10/09/2016 11:49:22 AM This report has been signed electronically. Number of Addenda: 1 Addendum Number: 1   Addendum Date: 10/26/2016 11:33:27 AM      Once EGD completed, Colonoscope placed in rectum. He was found to be       obviously inadequately prepped. No attempts at completing colonoscopy       undertaken.Procedure canceled. Cristopher Estimable. Brynlyn Dade, MD Norvel Richards, MD 10/26/2016 11:35:21 AM This report has been signed electronically.

## 2016-10-09 NOTE — Anesthesia Preprocedure Evaluation (Addendum)
Anesthesia Evaluation  Patient identified by MRN, date of birth, ID band Patient awake    Reviewed: Allergy & Precautions, NPO status , Patient's Chart, lab work & pertinent test results  Airway Mallampati: II   Neck ROM: Full    Dental  (+) Teeth Intact, Poor Dentition, Dental Advisory Given   Pulmonary shortness of breath, COPD, Current Smoker,    breath sounds clear to auscultation       Cardiovascular hypertension, + CAD, + Past MI and +CHF   Rhythm:Regular Rate:Normal  Decreased EF Left ventricle: Moderately severe hypokinesis of mid   anterolateral segment, apical lateral segment, mid/apical   anterior segments. EF is 40%. The cavity size was normal. Wall thickness was increased in a pattern of mild LVH.   Neuro/Psych  Headaches,    GI/Hepatic GERD  ,  Endo/Other  diabetes, Poorly Controlled, Type 2  Renal/GU      Musculoskeletal  (+) Arthritis ,   Abdominal (+) + obese,   Peds  Hematology   Anesthesia Other Findings Hx of VC damage during emergency intubation for angioedema from lisinopril.   Reproductive/Obstetrics                            Anesthesia Physical Anesthesia Plan  ASA: III  Anesthesia Plan: MAC   Post-op Pain Management:    Induction: Intravenous  Airway Management Planned: Simple Face Mask  Additional Equipment:   Intra-op Plan:   Post-operative Plan:   Informed Consent: I have reviewed the patients History and Physical, chart, labs and discussed the procedure including the risks, benefits and alternatives for the proposed anesthesia with the patient or authorized representative who has indicated his/her understanding and acceptance.     Plan Discussed with:   Anesthesia Plan Comments:         Anesthesia Quick Evaluation

## 2016-10-09 NOTE — Anesthesia Postprocedure Evaluation (Signed)
Anesthesia Post Note  Patient: Randy Hebert  Procedure(s) Performed: Procedure(s) (LRB): COLONOSCOPY WITH PROPOFOL (N/A) ESOPHAGOGASTRODUODENOSCOPY (EGD) WITH PROPOFOL (N/A) BIOPSY  Patient location during evaluation: PACU Anesthesia Type: MAC Level of consciousness: awake and alert, oriented and patient cooperative Pain management: satisfactory to patient Vital Signs Assessment: post-procedure vital signs reviewed and stable Respiratory status: spontaneous breathing, respiratory function stable and patient connected to nasal cannula oxygen Cardiovascular status: blood pressure returned to baseline and stable Postop Assessment: no headache and adequate PO intake Anesthetic complications: no     Last Vitals:  Vitals:   10/09/16 1100 10/09/16 1105  BP: 127/79 135/76  Pulse:    Resp: (!) 29 (!) 29  Temp:      Last Pain:  Vitals:   10/09/16 0944  TempSrc: Oral  PainSc: 3                  Zephyr Ridley

## 2016-10-09 NOTE — Discharge Instructions (Addendum)
GERD information provided  Begin Dexilant 60 mg daily  You were not adequately cleaned out for colonoscopy today.  We will make an appointment  to see [place] in a month to try again  Further recommendations to follow pending review of pathology report    EGD Discharge instructions Please read the instructions outlined below and refer to this sheet in the next few weeks. These discharge instructions provide you with general information on caring for yourself after you leave the hospital. Your doctor may also give you specific instructions. While your treatment has been planned according to the most current medical practices available, unavoidable complications occasionally occur. If you have any problems or questions after discharge, please call your doctor. ACTIVITY  You may resume your regular activity but move at a slower pace for the next 24 hours.   Take frequent rest periods for the next 24 hours.   Walking will help expel (get rid of) the air and reduce the bloated feeling in your abdomen.   No driving for 24 hours (because of the anesthesia (medicine) used during the test).   You may shower.   Do not sign any important legal documents or operate any machinery for 24 hours (because of the anesthesia used during the test).  NUTRITION  Drink plenty of fluids.   You may resume your normal diet.   Begin with a light meal and progress to your normal diet.   Avoid alcoholic beverages for 24 hours or as instructed by your caregiver.  MEDICATIONS  You may resume your normal medications unless your caregiver tells you otherwise.  WHAT YOU CAN EXPECT TODAY  You may experience abdominal discomfort such as a feeling of fullness or gas pains.  FOLLOW-UP  Your doctor will discuss the results of your test with you.  SEEK IMMEDIATE MEDICAL ATTENTION IF ANY OF THE FOLLOWING OCCUR:  Excessive nausea (feeling sick to your stomach) and/or vomiting.   Severe abdominal pain and  distention (swelling).   Trouble swallowing.   Temperature over 101 F (37.8 C).   Rectal bleeding or vomiting of blood.     Gastroesophageal Reflux Disease, Adult Normally, food travels down the esophagus and stays in the stomach to be digested. However, when a person has gastroesophageal reflux disease (GERD), food and stomach acid move back up into the esophagus. When this happens, the esophagus becomes sore and inflamed. Over time, GERD can create small holes (ulcers) in the lining of the esophagus. What are the causes? This condition is caused by a problem with the muscle between the esophagus and the stomach (lower esophageal sphincter, or LES). Normally, the LES muscle closes after food passes through the esophagus to the stomach. When the LES is weakened or abnormal, it does not close properly, and that allows food and stomach acid to go back up into the esophagus. The LES can be weakened by certain dietary substances, medicines, and medical conditions, including:  Tobacco use.  Pregnancy.  Having a hiatal hernia.  Heavy alcohol use.  Certain foods and beverages, such as coffee, chocolate, onions, and peppermint. What increases the risk? This condition is more likely to develop in:  People who have an increased body weight.  People who have connective tissue disorders.  People who use NSAID medicines. What are the signs or symptoms? Symptoms of this condition include:  Heartburn.  Difficult or painful swallowing.  The feeling of having a lump in the throat.  Abitter taste in the mouth.  Bad breath.  Having  a large amount of saliva.  Having an upset or bloated stomach.  Belching.  Chest pain.  Shortness of breath or wheezing.  Ongoing (chronic) cough or a night-time cough.  Wearing away of tooth enamel.  Weight loss. Different conditions can cause chest pain. Make sure to see your health care provider if you experience chest pain. How is this  diagnosed? Your health care provider will take a medical history and perform a physical exam. To determine if you have mild or severe GERD, your health care provider may also monitor how you respond to treatment. You may also have other tests, including:  An endoscopy toexamine your stomach and esophagus with a small camera.  A test thatmeasures the acidity level in your esophagus.  A test thatmeasures how much pressure is on your esophagus.  A barium swallow or modified barium swallow to show the shape, size, and functioning of your esophagus. How is this treated? The goal of treatment is to help relieve your symptoms and to prevent complications. Treatment for this condition may vary depending on how severe your symptoms are. Your health care provider may recommend:  Changes to your diet.  Medicine.  Surgery. Follow these instructions at home: Diet  Follow a diet as recommended by your health care provider. This may involve avoiding foods and drinks such as:  Coffee and tea (with or without caffeine).  Drinks that containalcohol.  Energy drinks and sports drinks.  Carbonated drinks or sodas.  Chocolate and cocoa.  Peppermint and mint flavorings.  Garlic and onions.  Horseradish.  Spicy and acidic foods, including peppers, chili powder, curry powder, vinegar, hot sauces, and barbecue sauce.  Citrus fruit juices and citrus fruits, such as oranges, lemons, and limes.  Tomato-based foods, such as red sauce, chili, salsa, and pizza with red sauce.  Fried and fatty foods, such as donuts, french fries, potato chips, and high-fat dressings.  High-fat meats, such as hot dogs and fatty cuts of red and white meats, such as rib eye steak, sausage, ham, and bacon.  High-fat dairy items, such as whole milk, butter, and cream cheese.  Eat small, frequent meals instead of large meals.  Avoid drinking large amounts of liquid with your meals.  Avoid eating meals during the  2-3 hours before bedtime.  Avoid lying down right after you eat.  Do not exercise right after you eat. General instructions  Pay attention to any changes in your symptoms.  Take over-the-counter and prescription medicines only as told by your health care provider. Do not take aspirin, ibuprofen, or other NSAIDs unless your health care provider told you to do so.  Do not use any tobacco products, including cigarettes, chewing tobacco, and e-cigarettes. If you need help quitting, ask your health care provider.  Wear loose-fitting clothing. Do not wear anything tight around your waist that causes pressure on your abdomen.  Raise (elevate) the head of your bed 6 inches (15cm).  Try to reduce your stress, such as with yoga or meditation. If you need help reducing stress, ask your health care provider.  If you are overweight, reduce your weight to an amount that is healthy for you. Ask your health care provider for guidance about a safe weight loss goal.  Keep all follow-up visits as told by your health care provider. This is important. Contact a health care provider if:  You have new symptoms.  You have unexplained weight loss.  You have difficulty swallowing, or it hurts to swallow.  You have  wheezing or a persistent cough.  Your symptoms do not improve with treatment.  You have a hoarse voice. Get help right away if:  You have pain in your arms, neck, jaw, teeth, or back.  You feel sweaty, dizzy, or light-headed.  You have chest pain or shortness of breath.  You vomit and your vomit looks like blood or coffee grounds.  You faint.  Your stool is bloody or black.  You cannot swallow, drink, or eat. This information is not intended to replace advice given to you by your health care provider. Make sure you discuss any questions you have with your health care provider. Document Released: 05/28/2005 Document Revised: 01/16/2016 Document Reviewed: 12/13/2014 Elsevier  Interactive Patient Education  2017 [employer].

## 2016-10-09 NOTE — Interval H&P Note (Signed)
History and Physical Interval Note:  10/09/2016 11:10 AM  [name]  has presented today for surgery, with the diagnosis of rectal bleeding, epig pain, GERD, early satiety  The various methods of treatment have been discussed with the patient and family. After consideration of risks, benefits and other options for treatment, the patient has consented to  Procedure(s) with comments: COLONOSCOPY WITH PROPOFOL (N/A) - 12:00 pm ESOPHAGOGASTRODUODENOSCOPY (EGD) WITH PROPOFOL (N/A) as a surgical intervention .  The patient's history has been reviewed, patient examined, no change in status, stable for surgery.  I have reviewed the patient's chart and labs.  Questions were answered to the patient's satisfaction.     Thedore Pickel  No change. Patient denies dysphagia. Diagnostic EGD and colonoscopy per plan.  The risks, benefits, limitations, imponderables and alternatives regarding both EGD and colonoscopy have been reviewed with the patient. Questions have been answered. All parties agreeable.

## 2016-10-10 ENCOUNTER — Telehealth: Payer: Self-pay | Admitting: Internal Medicine

## 2016-10-10 NOTE — Telephone Encounter (Signed)
Pt called about a prescription (flexirel) and said that his pharmacy doesn't have it and was going to fax [place] something and if it was too expensive and his medicaid doesn't cover it he may not get it, but ask if there was something else and could he get samples. I told him that I was going to transfer him to [facility] and to tell her what he just told me and to leave a good number for her to call him at. Pt agreed

## 2016-10-12 ENCOUNTER — Encounter: Payer: Self-pay | Admitting: Internal Medicine

## 2016-10-13 ENCOUNTER — Encounter: Payer: Self-pay | Admitting: Internal Medicine

## 2016-10-13 ENCOUNTER — Telehealth: Payer: Self-pay

## 2016-10-13 MED ORDER — PANTOPRAZOLE SODIUM 40 MG PO TBEC: 40.0000 mg | DELAYED_RELEASE_TABLET | Freq: Every day | ORAL | 11 refills | Status: DC

## 2016-10-13 NOTE — Telephone Encounter (Signed)
Letter mailed to the pt. 

## 2016-10-13 NOTE — Addendum Note (Signed)
Addended by: Claudina Lick on: 10/13/2016 04:10 PM   Modules accepted: Orders

## 2016-10-13 NOTE — Telephone Encounter (Signed)
rx has been sent in to the pharmacy.

## 2016-10-13 NOTE — Telephone Encounter (Signed)
Per RMR- Send letter to patient.  Send copy of letter with path to referring provider and PCP.  Prep inadequate no tcs; girlfrient told me "he didn't follow instructions all".  We need to get him back and try again.

## 2016-10-13 NOTE — Telephone Encounter (Signed)
APPT MADE AND LETTER SENT  °

## 2016-10-13 NOTE — Telephone Encounter (Signed)
Spoke with the pt, he said he had never taken anything other than otc zantac and pepcid. [place] medicaid will not pay for dexilant unless the pt has tried and failed prilosec and protonix. Pt is aware and has agreed to try prilosec or protonix if ok with RMR.  Dr.Rourk- is it ok for [place] to change medications?

## 2016-10-13 NOTE — Telephone Encounter (Signed)
Medication is dexilant. Pt has [place] medicaid and I have look thru his chart and cannot find any record of him taking prilosec or protonix. I have called the pt- LMOM asking him to call me back and let me know if he has tried either of these medications.

## 2016-10-13 NOTE — Telephone Encounter (Signed)
Lets go w Protonix generic  #30 one daily  11 refills

## 2016-10-27 ENCOUNTER — Encounter (HOSPITAL_COMMUNITY): Payer: Self-pay | Admitting: Internal Medicine

## 2016-10-28 ENCOUNTER — Other Ambulatory Visit: Payer: Self-pay

## 2016-10-28 ENCOUNTER — Ambulatory Visit (INDEPENDENT_AMBULATORY_CARE_PROVIDER_SITE_OTHER): Payer: Medicaid Other | Admitting: Gastroenterology

## 2016-10-28 ENCOUNTER — Telehealth (INDEPENDENT_AMBULATORY_CARE_PROVIDER_SITE_OTHER): Payer: Self-pay | Admitting: Orthopaedic Surgery

## 2016-10-28 ENCOUNTER — Encounter: Payer: Self-pay | Admitting: Gastroenterology

## 2016-10-28 VITALS — BP 143/79 | HR 90 | Temp 98.2°F | Ht 65.0 in | Wt 224.4 lb

## 2016-10-28 DIAGNOSIS — K625 Hemorrhage of anus and rectum: Secondary | ICD-10-CM

## 2016-10-28 MED ORDER — PEG 3350-KCL-NA BICARB-NACL 420 G PO SOLR: 4000.0000 mL | ORAL | 0 refills | Status: DC

## 2016-10-28 NOTE — Patient Instructions (Signed)
1. Colonoscopy as scheduled. See separate instructions. 2. We have provided you with Linzess samples. Starting one week before your bowel prep starts, take one daily on empty stomach.

## 2016-10-28 NOTE — Telephone Encounter (Signed)
Pt requesting antibiotic for a dental procedure.  Pt allergic to Amoxicillin   [phone]

## 2016-10-28 NOTE — Telephone Encounter (Signed)
Patient aware he does not need antibiotics at this point

## 2016-10-28 NOTE — Progress Notes (Signed)
cc'ed to pcp °

## 2016-10-28 NOTE — Progress Notes (Signed)
Primary Care Physician: Soyla Dryer, PA-C  Primary Gastroenterologist:  Garfield Cornea, MD   Chief Complaint  Patient presents with  . Colonoscopy    had poor prep    HPI: Randy Hebert is a 51 y.o. male here to reschedule colonoscopy due to poor prep. He took Prepopik and states he followed the instructions as well as clear liquid instructions.   Patient has never had a colonoscopy. No known family history of colon cancer although his mother died of some sort of cancer but is unclear on the details. He reports having colitis last year. CT confirmed in August 2017. Beginning in the proximal descending colon and extending through the hepatic flexure into the ascending colon there was generalized colonic wall thickening. Associated mesenteric stranding at the hepatic flexure. Cecum and appendix were normal. TI was normal. Hepatic steatosis noted. Major arterial structures patent.  Since his procedure patient states he has been constipated. He's tried Colace and prune juice. History of intermittent bright red blood per rectum. No significant abdominal pain. Upper GI symptoms improved on pantoprazole. EGD earlier this month with LA grade a esophagitis, new diagnosis of Barrett's esophagus confirmed with biopsies.     Current Outpatient Prescriptions  Medication Sig Dispense Refill  . acetaminophen (TYLENOL) 500 MG tablet Take 500 mg by mouth every 6 (six) hours as needed for mild pain.    Marland Kitchen albuterol (PROVENTIL HFA;VENTOLIN HFA) 108 (90 Base) MCG/ACT inhaler Inhale 2 puffs into the lungs every 6 (six) hours as needed for wheezing or shortness of breath. 1 Inhaler 4  . albuterol (PROVENTIL) (2.5 MG/3ML) 0.083% nebulizer solution Take 3 mLs (2.5 mg total) by nebulization every 6 (six) hours as needed for wheezing or shortness of breath. 150 mL 1  . amLODipine (NORVASC) 10 MG tablet Take 1 tablet (10 mg total) by mouth daily. 30 tablet 4  . aspirin EC 81 MG tablet Take 1 tablet (81  mg total) by mouth 2 (two) times daily after a meal. (Patient taking differently: Take 81 mg by mouth daily. ) 60 tablet 0  . atorvastatin (LIPITOR) 20 MG tablet Take 1 tablet (20 mg total) by mouth daily. (Patient taking differently: Take 20 mg by mouth daily at 6 PM. ) 30 tablet 4  . Carboxymeth-Glycerin-Polysorb (REFRESH OPTIVE ADVANCED) 0.5-1-0.5 % SOLN Apply 1 drop to eye daily as needed (dry eyes).     . citalopram (CELEXA) 10 MG tablet Take 10 mg by mouth daily.    . cycloSPORINE (RESTASIS) 0.05 % ophthalmic emulsion Place 1 drop into both eyes 2 (two) times daily.    Marland Kitchen ibuprofen (ADVIL,MOTRIN) 200 MG tablet Take 600 mg by mouth every 6 (six) hours as needed for moderate pain.    . isosorbide mononitrate (IMDUR) 30 MG 24 hr tablet Take 0.5 tablets (15 mg total) by mouth daily. 45 tablet 3  . Menthol, Topical Analgesic, (BENGAY EX) Apply 1 application topically daily as needed (PAIN).     Marland Kitchen metFORMIN (GLUCOPHAGE) 1000 MG tablet Take 1 tablet (1,000 mg total) by mouth 2 (two) times daily with a meal. 60 tablet 4  . mometasone-formoterol (DULERA) 100-5 MCG/ACT AERO Inhale 2 puffs into the lungs 2 (two) times daily. 1 Inhaler 4  . nicotine (NICODERM CQ) 7 mg/24hr patch Place 1 patch (7 mg total) onto the skin daily. (Patient taking differently: Place 7 mg onto the skin daily. Does not take every day) 28 patch 1  . nitroGLYCERIN (NITROSTAT) 0.4 MG SL tablet  Place 1 tablet (0.4 mg total) under the tongue every 5 (five) minutes as needed for chest pain. 25 tablet 3  . Olopatadine HCl (PAZEO) 0.7 % SOLN Place 1 drop into both eyes daily.     . Omega-3 Fatty Acids (FISH OIL PO) Take 1,200 mg by mouth 2 (two) times daily.    Marland Kitchen oxymetazoline (AFRIN) 0.05 % nasal spray Place 1 spray into both nostrils 2 (two) times daily as needed for congestion.    . pantoprazole (PROTONIX) 40 MG tablet Take 1 tablet (40 mg total) by mouth daily. 30 tablet 11  . traZODone (DESYREL) 100 MG tablet Take 100 mg by mouth at  bedtime as needed for sleep.     . polyethylene glycol-electrolytes (TRILYTE) 420 g solution Take 4,000 mLs by mouth as directed. 4000 mL 0   No current facility-administered medications for this visit.     Allergies as of 10/28/2016 - Review Complete 10/28/2016  Allergen Reaction Noted  . Carvedilol Other (See Comments) 02/08/2015  . Lisinopril Anaphylaxis, Shortness Of Breath, and Swelling 05/30/2015  . Amoxicillin Nausea And Vomiting 02/06/2015   Past Medical History:  Diagnosis Date  . Anxiety   . Arthritis   . Back pain   . Bulging of cervical intervertebral disc   . CAD (coronary artery disease)    lateral STEMI 02/06/2015 00% D1 occlusion treated with Promus Premier 2.5 mm x 16 mm DES, 70% ramus stenosis, 40% mid RCA stenosis, 45% distal RCA stenosis, EF 45-50%  . CHF (congestive heart failure) ([place])   . COPD (chronic obstructive pulmonary disease) ([place])   . Depression   . Diabetes mellitus without complication ([place])   . 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 Dry eye   . Dyspnea   . GERD (gastroesophageal reflux disease)   . Headache   . Hip pain   . MI (myocardial infarction)   . Neck pain   . Otitis media   . Pleurisy   . Sinus pause    9 sec sinus pause on telemetry after started on coreg after MI, avoid AV nodal blocking agent   Past Surgical History:  Procedure Laterality Date  . BIOPSY  10/09/2016   Procedure: BIOPSY;  Surgeon: Daneil Dolin, MD;  [facility];  Service: Endoscopy;;  . CARDIAC CATHETERIZATION N/A 02/06/2015   Procedure: Left Heart Cath and Coronary Angiography;  Surgeon: Leonie Man, MD;  Location: [facility];  Service: Cardiovascular;  Laterality: N/A;  . CARDIAC CATHETERIZATION N/A 02/06/2015   Procedure: Coronary Stent Intervention;  Surgeon: Leonie Man, MD;  Location: [facility];  Service: Cardiovascular;  Laterality: N/A;  . COLONOSCOPY WITH PROPOFOL N/A 10/09/2016    Procedure: COLONOSCOPY WITH PROPOFOL;  Surgeon: Daneil Dolin, MD;  [facility]: [facility];  Service: Endoscopy;  Laterality: N/A;  12:00 pm  . CORONARY ANGIOPLASTY WITH STENT PLACEMENT  01/2015  . ESOPHAGOGASTRODUODENOSCOPY (EGD) WITH PROPOFOL N/A 10/09/2016   Procedure: ESOPHAGOGASTRODUODENOSCOPY (EGD) WITH PROPOFOL;  Surgeon: Daneil Dolin, MD;  [facility];  Service: Endoscopy;  Laterality: N/A;  . INCISION / DRAINAGE HAND / FINGER    . TOTAL HIP ARTHROPLASTY Right 07/01/2016  . TOTAL HIP ARTHROPLASTY Right 07/01/2016   Procedure: RIGHT TOTAL HIP ARTHROPLASTY ANTERIOR APPROACH;  Surgeon: Mcarthur Rossetti, MD;  Location: [facility];  Service: Orthopedics;  Laterality: Right;   Family History  Problem Relation Age of Onset  .  Heart attack Father   . Stroke Father   . Cancer Mother     ???   Social History   Social History  . Marital status: Divorced    Spouse name: N/A  . Number of children: N/A  . Years of education: N/A   Social History Main Topics  . Smoking status: Current Some Day Smoker    Packs/day: 0.50    Years: 46.00    Types: Cigarettes    Start date: 06/07/1979  . Smokeless tobacco: Former Systems developer    Types: Chew     Comment: plans to get patches  . 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 now drinks on occasion.  . Drug use: No     Comment: history of drug use- marijuana, cocaine  . Sexual activity: Yes    Partners: Female    Birth control/ protection: None   Other Topics Concern  . None   Social History Narrative  . None    ROS:  General: Negative for anorexia, weight loss, fever, chills, fatigue, weakness. ENT: Negative for hoarseness, difficulty swallowing , nasal congestion. CV: Negative for chest pain, angina, palpitations, dyspnea on exertion, peripheral edema.  Respiratory: Negative for dyspnea at rest, dyspnea on exertion, cough, sputum, wheezing.    GI: See history of present illness. GU:  Negative for dysuria, hematuria, urinary incontinence, urinary frequency, nocturnal urination.  Endo: Negative for unusual weight change.    Physical Examination:   BP (!) 143/79   Pulse 90   Temp 98.2 F (36.8 C) (Oral)   Ht '5\' 5"'$  (1.651 m)   Wt 224 lb 6.4 oz (101.8 kg)   BMI 37.34 kg/m   General: Well-nourished, well-developed in no acute distress.  Eyes: No icterus. Mouth: Oropharyngeal mucosa moist and pink , no lesions erythema or exudate. Lungs: Clear to auscultation bilaterally.  Heart: Regular rate and rhythm, no murmurs rubs or gallops.  Abdomen: Bowel sounds are normal, nontender, nondistended, no hepatosplenomegaly or masses, no abdominal bruits or hernia , no rebound or guarding.   Extremities: No lower extremity edema. No clubbing or deformities. Neuro: Alert and oriented x 4   Skin: Warm and dry, no jaundice.   Psych: Alert and cooperative, normal mood and affect.    Impression/Plan:  51 year old gentleman presenting to reschedule colonoscopy due to recent inadequate bowel prep. Patient has a history of colitis with CT last year. Has some intermittent bright red blood per rectum, abdominal cramping and nervous stomach. No prior colonoscopy. Plan for colonoscopy with deep sedation in the OR. Modified bowel prep including 2 full days of clear liquids, begin Linzess 240mg daily for one week before bowel prep, samples provided. Plan for split TriLyte prep.  I have discussed the risks, alternatives, benefits with regards to but not limited to the risk of reaction to medication, bleeding, infection, perforation and the patient is agreeable to proceed. Written consent to be obtained.

## 2016-10-30 ENCOUNTER — Telehealth (INDEPENDENT_AMBULATORY_CARE_PROVIDER_SITE_OTHER): Payer: Self-pay | Admitting: *Deleted

## 2016-10-30 NOTE — Telephone Encounter (Signed)
Pt needs letter stating he does not have to have Antibiotics for his procedure. FAX: [facility]. Pt has appt Monday 3/5

## 2016-10-30 NOTE — Telephone Encounter (Signed)
faxed

## 2016-11-10 ENCOUNTER — Ambulatory Visit: Payer: Medicaid Other | Admitting: Physician Assistant

## 2016-11-11 ENCOUNTER — Encounter: Payer: Self-pay | Admitting: Physician Assistant

## 2016-11-11 ENCOUNTER — Ambulatory Visit: Payer: Medicaid Other | Admitting: Physician Assistant

## 2016-11-11 VITALS — BP 118/68 | HR 90 | Temp 98.4°F | Wt 230.8 lb

## 2016-11-11 DIAGNOSIS — I10 Essential (primary) hypertension: Secondary | ICD-10-CM

## 2016-11-11 DIAGNOSIS — E669 Obesity, unspecified: Secondary | ICD-10-CM

## 2016-11-11 DIAGNOSIS — E119 Type 2 diabetes mellitus without complications: Secondary | ICD-10-CM

## 2016-11-11 DIAGNOSIS — J449 Chronic obstructive pulmonary disease, unspecified: Secondary | ICD-10-CM

## 2016-11-11 DIAGNOSIS — E785 Hyperlipidemia, unspecified: Secondary | ICD-10-CM

## 2016-11-11 DIAGNOSIS — F17219 Nicotine dependence, cigarettes, with unspecified nicotine-induced disorders: Secondary | ICD-10-CM

## 2016-11-11 DIAGNOSIS — Z6838 Body mass index (BMI) 38.0-38.9, adult: Secondary | ICD-10-CM

## 2016-11-11 NOTE — Progress Notes (Signed)
BP 118/68 (BP Location: Left Arm, Patient Position: Sitting, Cuff Size: Normal)   Pulse 90   Temp 98.4 F (36.9 C)   Wt 230 lb 12 oz (104.7 kg)   SpO2 93%   BMI 38.40 kg/m    Subjective:    Patient ID: Randy Hebert, male    DOB: 1966/05/03, 51 y.o.   MRN: [id number]  HPI: Randy Hebert is a 51 y.o. male presenting on 11/11/2016 for Diabetes; Hypertension; and Hyperlipidemia   HPI   Pt still smoking  Pt has colonoscopy scheduled for later this month.    He says his breathing is stable.  He is enjoying his new hip.      Relevant past medical, surgical, family and social history reviewed and updated as indicated. Interim medical history since our last visit reviewed. Allergies and medications reviewed and updated.   Current Outpatient Prescriptions:  .  acetaminophen (TYLENOL) 500 MG tablet, Take 500 mg by mouth every 6 (six) hours as needed for mild pain., Disp: , Rfl:  .  albuterol (PROVENTIL HFA;VENTOLIN HFA) 108 (90 Base) MCG/ACT inhaler, Inhale 2 puffs into the lungs every 6 (six) hours as needed for wheezing or shortness of breath., Disp: 1 Inhaler, Rfl: 4 .  albuterol (PROVENTIL) (2.5 MG/3ML) 0.083% nebulizer solution, Take 3 mLs (2.5 mg total) by nebulization every 6 (six) hours as needed for wheezing or shortness of breath., Disp: 150 mL, Rfl: 1 .  amLODipine (NORVASC) 10 MG tablet, Take 1 tablet (10 mg total) by mouth daily., Disp: 30 tablet, Rfl: 4 .  aspirin EC 81 MG tablet, Take 1 tablet (81 mg total) by mouth 2 (two) times daily after a meal. (Patient taking differently: Take 81 mg by mouth daily. ), Disp: 60 tablet, Rfl: 0 .  atorvastatin (LIPITOR) 20 MG tablet, Take 1 tablet (20 mg total) by mouth daily. (Patient taking differently: Take 20 mg by mouth daily at 6 PM. ), Disp: 30 tablet, Rfl: 4 .  Carboxymeth-Glycerin-Polysorb (REFRESH OPTIVE ADVANCED) 0.5-1-0.5 % SOLN, Apply 1 drop to eye daily as needed (dry eyes). , Disp: , Rfl:  .  citalopram (CELEXA) 10  MG tablet, Take 10 mg by mouth daily., Disp: , Rfl:  .  cycloSPORINE (RESTASIS) 0.05 % ophthalmic emulsion, Place 1 drop into both eyes 2 (two) times daily., Disp: , Rfl:  .  ibuprofen (ADVIL,MOTRIN) 200 MG tablet, Take 600 mg by mouth every 6 (six) hours as needed for moderate pain., Disp: , Rfl:  .  isosorbide mononitrate (IMDUR) 30 MG 24 hr tablet, Take 0.5 tablets (15 mg total) by mouth daily., Disp: 45 tablet, Rfl: 3 .  Menthol, Topical Analgesic, (BENGAY EX), Apply 1 application topically daily as needed (PAIN). , Disp: , Rfl:  .  metFORMIN (GLUCOPHAGE) 1000 MG tablet, Take 1 tablet (1,000 mg total) by mouth 2 (two) times daily with a meal., Disp: 60 tablet, Rfl: 4 .  mometasone-formoterol (DULERA) 100-5 MCG/ACT AERO, Inhale 2 puffs into the lungs 2 (two) times daily., Disp: 1 Inhaler, Rfl: 4 .  nicotine (NICODERM CQ) 7 mg/24hr patch, Place 1 patch (7 mg total) onto the skin daily. (Patient taking differently: Place 7 mg onto the skin daily. Does not take every day), Disp: 28 patch, Rfl: 1 .  nitroGLYCERIN (NITROSTAT) 0.4 MG SL tablet, Place 1 tablet (0.4 mg total) under the tongue every 5 (five) minutes as needed for chest pain., Disp: 25 tablet, Rfl: 3 .  Olopatadine HCl (PAZEO) 0.7 % SOLN, Place  1 drop into both eyes daily. , Disp: , Rfl:  .  Omega-3 Fatty Acids (FISH OIL PO), Take 1,200 mg by mouth 2 (two) times daily., Disp: , Rfl:  .  oxymetazoline (AFRIN) 0.05 % nasal spray, Place 1 spray into both nostrils 2 (two) times daily as needed for congestion., Disp: , Rfl:  .  pantoprazole (PROTONIX) 40 MG tablet, Take 1 tablet (40 mg total) by mouth daily., Disp: 30 tablet, Rfl: 11 .  polyethylene glycol-electrolytes (TRILYTE) 420 g solution, Take 4,000 mLs by mouth as directed., Disp: 4000 mL, Rfl: 0 .  traZODone (DESYREL) 100 MG tablet, Take 100 mg by mouth at bedtime as needed for sleep. , Disp: , Rfl:   Review of Systems  Constitutional: Positive for appetite change. Negative for chills,  diaphoresis, fatigue, fever and unexpected weight change.  HENT: Positive for congestion and sneezing. Negative for dental problem, drooling, ear pain, facial swelling, hearing loss, mouth sores, sore throat, trouble swallowing and voice change.   Eyes: Positive for discharge and itching. Negative for pain, redness and visual disturbance.  Respiratory: Positive for cough, shortness of breath and wheezing. Negative for choking.   Cardiovascular: Negative for chest pain, palpitations and leg swelling.  Gastrointestinal: Positive for abdominal pain and constipation. Negative for blood in stool, diarrhea and vomiting.  Endocrine: Positive for heat intolerance. Negative for cold intolerance and polydipsia.  Genitourinary: Negative for decreased urine volume, dysuria and hematuria.  Musculoskeletal: Positive for arthralgias and back pain. Negative for gait problem.  Skin: Negative for rash.  Allergic/Immunologic: Negative for environmental allergies.  Neurological: Negative for seizures, syncope, light-headedness and headaches.  Hematological: Negative for adenopathy.  Psychiatric/Behavioral: Positive for agitation. Negative for dysphoric mood and suicidal ideas. The patient is nervous/anxious.     Per HPI unless specifically indicated above     Objective:    BP 118/68 (BP Location: Left Arm, Patient Position: Sitting, Cuff Size: Normal)   Pulse 90   Temp 98.4 F (36.9 C)   Wt 230 lb 12 oz (104.7 kg)   SpO2 93%   BMI 38.40 kg/m   Wt Readings from Last 3 Encounters:  11/11/16 230 lb 12 oz (104.7 kg)  10/28/16 224 lb 6.4 oz (101.8 kg)  10/09/16 224 lb (101.6 kg)    Physical Exam  Constitutional: He is oriented to person, place, and time. He appears well-developed and well-nourished.  HENT:  Head: Normocephalic and atraumatic.  Neck: Neck supple.  Cardiovascular: Normal rate and regular rhythm.   Pulmonary/Chest: Effort normal and breath sounds normal. He has no wheezes.  Abdominal:  Soft. Bowel sounds are normal. There is no hepatosplenomegaly. There is no tenderness.  Musculoskeletal: He exhibits no edema.  Lymphadenopathy:    He has no cervical adenopathy.  Neurological: He is alert and oriented to person, place, and time.  Skin: Skin is warm and dry.  Psychiatric: He has a normal mood and affect. His behavior is normal.  Vitals reviewed.       Assessment & Plan:    Encounter Diagnoses  Name Primary?  . Controlled type 2 diabetes mellitus without complication, unspecified long term insulin use status ([place]) Yes  . Essential hypertension   . Hyperlipidemia, unspecified hyperlipidemia type   . Chronic obstructive pulmonary disease, unspecified COPD type ([place])   . Cigarette nicotine dependence with nicotine-induced disorder   . Class 2 obesity with body mass index (BMI) of 38.0 to 38.9 in adult, unspecified obesity type, unspecified whether serious comorbidity present      -  Check lipids, lfts, a1c -counseled pt on Smoking cessation. Discussed how it will help his COPD as well as his Barrettt's esophagus, his hyperlipidemia, his  Diabetes, his CAD -counseled on weight management -Follow up 3 months.  RTO sooner prn

## 2016-11-12 LAB — LIPID PANEL
CHOL/HDL RATIO: 5.6 ratio — AB (ref <5.0)
Cholesterol: 112 mg/dL (ref <200)
HDL: 20 mg/dL — AB (ref >40)
LDL CALC: 49 mg/dL (ref <100)
Triglycerides: 214 mg/dL — ABNORMAL HIGH (ref <150)
VLDL: 43 mg/dL — ABNORMAL HIGH (ref <30)

## 2016-11-12 LAB — HEPATIC FUNCTION PANEL
ALT: 32 U/L (ref 9–46)
AST: 23 U/L (ref 10–35)
Albumin: 3.9 g/dL (ref 3.6–5.1)
Alkaline Phosphatase: 102 U/L (ref 40–115)
BILIRUBIN INDIRECT: 0.5 mg/dL (ref 0.2–1.2)
Bilirubin, Direct: 0.2 mg/dL (ref <=0.2)
TOTAL PROTEIN: 6.8 g/dL (ref 6.1–8.1)
Total Bilirubin: 0.7 mg/dL (ref 0.2–1.2)

## 2016-11-13 ENCOUNTER — Encounter (HOSPITAL_COMMUNITY): Payer: Self-pay

## 2016-11-13 LAB — HEMOGLOBIN A1C
HEMOGLOBIN A1C: 6.8 % — AB (ref <5.7)
Mean Plasma Glucose: 148 mg/dL

## 2016-11-13 NOTE — Patient Instructions (Signed)
Randy Hebert  11/13/2016     '@PREFPERIOPPHARMACY'$ @   Your procedure is scheduled on  11/24/2016   Report to [facility] at  1230  P.M.  Call this number if you have problems the morning of surgery:  [phone]   Remember:  Do not eat food or drink liquids after midnight.  Take these medicines the morning of surgery with A SIP OF WATER  norvasc, clexa, imdur, protonix.   Do not wear jewelry, make-up or nail polish.  Do not wear lotions, powders, or perfumes, or deoderant.  Do not shave 48 hours prior to surgery.  Men may shave face and neck.  Do not bring valuables to the hospital.  [facility] is not responsible for any belongings or valuables.  Contacts, dentures or bridgework may not be worn into surgery.  Leave your suitcase in the car.  After surgery it may be brought to your room.  For patients admitted to the hospital, discharge time will be determined by your treatment team.  Patients discharged the day of surgery will not be allowed to drive home.   Name and phone number of your driver:   family Special instructions:  Follow the diet and prep instructions given to you by Dr Roseanne Kaufman office.  Please read over the following fact sheets that you were given. Anesthesia Post-op Instructions and Care and Recovery After Surgery       Colonoscopy, Adult A colonoscopy is an exam to look at the entire large intestine. During the exam, a lubricated, bendable tube is inserted into the anus and then passed into the rectum, colon, and other parts of the large intestine. A colonoscopy is often done as a part of normal colorectal screening or in response to certain symptoms, such as anemia, persistent diarrhea, abdominal pain, and blood in the stool. The exam can help screen for and diagnose medical problems, including:  Tumors.  Polyps.  Inflammation.  Areas of bleeding. Tell a health care provider about:  Any allergies you have.  All medicines you are  taking, including vitamins, herbs, eye drops, creams, and over-the-counter medicines.  Any problems you or family members have had with anesthetic medicines.  Any blood disorders you have.  Any surgeries you have had.  Any medical conditions you have.  Any problems you have had passing stool. What are the risks? Generally, this is a safe procedure. However, problems may occur, including:  Bleeding.  A tear in the intestine.  A reaction to medicines given during the exam.  Infection (rare). What happens before the procedure? Eating and drinking restrictions  Follow instructions from your health care provider about eating and drinking, which may include:  A few days before the procedure - follow a low-fiber diet. Avoid nuts, seeds, dried fruit, raw fruits, and vegetables.  1-3 days before the procedure - follow a clear liquid diet. Drink only clear liquids, such as clear broth or bouillon, black coffee or tea, clear juice, clear soft drinks or sports drinks, gelatin dessert, and popsicles. Avoid any liquids that contain red or purple dye.  On the day of the procedure - do not eat or drink anything during the 2 hours before the procedure, or within the time period that your health care provider recommends. Bowel prep  If you were prescribed an oral bowel prep to clean out your colon:  Take it as told by your health care provider. Starting the day before your procedure,  you will need to drink a large amount of medicated liquid. The liquid will cause you to have multiple loose stools until your stool is almost clear or light green.  If your skin or anus gets irritated from diarrhea, you may use these to relieve the irritation:  Medicated wipes, such as adult wet wipes with aloe and vitamin E.  A skin soothing-product like petroleum jelly.  If you vomit while drinking the bowel prep, take a break for up to 60 minutes and then begin the bowel prep again. If vomiting continues and  you cannot take the bowel prep without vomiting, call your health care provider. General instructions   Ask your health care provider about changing or stopping your regular medicines. This is especially important if you are taking diabetes medicines or blood thinners.  Plan to have someone take you home from the hospital or clinic. What happens during the procedure?  An IV tube may be inserted into one of your veins.  You will be given medicine to help you relax (sedative).  To reduce your risk of infection:  Your health care team will wash or sanitize their hands.  Your anal area will be washed with soap.  You will be asked to lie on your side with your knees bent.  Your health care provider will lubricate a long, thin, flexible tube. The tube will have a camera and a light on the end.  The tube will be inserted into your anus.  The tube will be gently eased through your rectum and colon.  Air will be delivered into your colon to keep it open. You may feel some pressure or cramping.  The camera will be used to take images during the procedure.  A small tissue sample may be removed from your body to be examined under a microscope (biopsy). If any potential problems are found, the tissue will be sent to a lab for testing.  If small polyps are found, your health care provider may remove them and have them checked for cancer cells.  The tube that was inserted into your anus will be slowly removed. The procedure may vary among health care providers and hospitals. What happens after the procedure?  Your blood pressure, heart rate, breathing rate, and blood oxygen level will be monitored until the medicines you were given have worn off.  Do not drive for 24 hours after the exam.  You may have a small amount of blood in your stool.  You may pass gas and have mild abdominal cramping or bloating due to the air that was used to inflate your colon during the exam.  It is up to you  to get the results of your procedure. Ask your health care provider, or the department performing the procedure, when your results will be ready. This information is not intended to replace advice given to you by your health care provider. Make sure you discuss any questions you have with your health care provider. Document Released: 08/15/2000 Document Revised: 06/18/2016 Document Reviewed: 10/30/2015 Elsevier Interactive Patient Education  2017 [employer]  Colonoscopy, Adult, Care After This sheet gives you information about how to care for yourself after your procedure. Your health care provider may also give you more specific instructions. If you have problems or questions, contact your health care provider. What can I expect after the procedure? After the procedure, it is common to have:  A small amount of blood in your stool for 24 hours after the procedure.  Some gas.  Mild abdominal cramping or bloating. Follow these instructions at home: General instructions    For the first 24 hours after the procedure:  Do not drive or use machinery.  Do not sign important documents.  Do not drink alcohol.  Do your regular daily activities at a slower pace than normal.  Eat soft, easy-to-digest foods.  Rest often.  Take over-the-counter or prescription medicines only as told by your health care provider.  It is up to you to get the results of your procedure. Ask your health care provider, or the department performing the procedure, when your results will be ready. Relieving cramping and bloating   Try walking around when you have cramps or feel bloated.  Apply heat to your abdomen as told by your health care provider. Use a heat source that your health care provider recommends, such as a moist heat pack or a heating pad.  Place a towel between your skin and the heat source.  Leave the heat on for 20-30 minutes.  Remove the heat if your skin turns bright red. This is  especially important if you are unable to feel pain, heat, or cold. You may have a greater risk of getting burned. Eating and drinking   Drink enough fluid to keep your urine clear or pale yellow.  Resume your normal diet as instructed by your health care provider. Avoid heavy or fried foods that are hard to digest.  Avoid drinking alcohol for as long as instructed by your health care provider. Contact a health care provider if:  You have blood in your stool 2-3 days after the procedure. Get help right away if:  You have more than a small spotting of blood in your stool.  You pass large blood clots in your stool.  Your abdomen is swollen.  You have nausea or vomiting.  You have a fever.  You have increasing abdominal pain that is not relieved with medicine. This information is not intended to replace advice given to you by your health care provider. Make sure you discuss any questions you have with your health care provider. Document Released: 04/01/2004 Document Revised: 05/12/2016 Document Reviewed: 10/30/2015 Elsevier Interactive Patient Education  2017 [place] Anesthesia is a term that refers to techniques, procedures, and medicines that help a person stay safe and comfortable during a medical procedure. Monitored anesthesia care, or sedation, is one type of anesthesia. Your anesthesia specialist may recommend sedation if you will be having a procedure that does not require you to be unconscious, such as:  Cataract surgery.  A dental procedure.  A biopsy.  A colonoscopy. During the procedure, you may receive a medicine to help you relax (sedative). There are three levels of sedation:  Mild sedation. At this level, you may feel awake and relaxed. You will be able to follow directions.  Moderate sedation. At this level, you will be sleepy. You may not remember the procedure.  Deep sedation. At this level, you will be asleep. You will not  remember the procedure. The more medicine you are given, the deeper your level of sedation will be. Depending on how you respond to the procedure, the anesthesia specialist may change your level of sedation or the type of anesthesia to fit your needs. An anesthesia specialist will monitor you closely during the procedure. Let your health care provider know about:  Any allergies you have.  All medicines you are taking, including vitamins, herbs, eye drops, creams, and  over-the-counter medicines.  Any use of steroids (by mouth or as a cream).  Any problems you or family members have had with sedatives and anesthetic medicines.  Any blood disorders you have.  Any surgeries you have had.  Any medical conditions you have, such as sleep apnea.  Whether you are pregnant or may be pregnant.  Any use of cigarettes, alcohol, or street drugs. What are the risks? Generally, this is a safe procedure. However, problems may occur, including:  Getting too much medicine (oversedation).  Nausea.  Allergic reaction to medicines.  Trouble breathing. If this happens, a breathing tube may be used to help with breathing. It will be removed when you are awake and breathing on your own.  Heart trouble.  Lung trouble. Before the procedure Staying hydrated  Follow instructions from your health care provider about hydration, which may include:  Up to 2 hours before the procedure - you may continue to drink clear liquids, such as water, clear fruit juice, black coffee, and plain tea. Eating and drinking restrictions  Follow instructions from your health care provider about eating and drinking, which may include:  8 hours before the procedure - stop eating heavy meals or foods such as meat, fried foods, or fatty foods.  6 hours before the procedure - stop eating light meals or foods, such as toast or cereal.  6 hours before the procedure - stop drinking milk or drinks that contain milk.  2 hours  before the procedure - stop drinking clear liquids. Medicines  Ask your health care provider about:  Changing or stopping your regular medicines. This is especially important if you are taking diabetes medicines or blood thinners.  Taking medicines such as aspirin and ibuprofen. These medicines can thin your blood. Do not take these medicines before your procedure if your health care provider instructs you not to. Tests and exams  You will have a physical exam.  You may have blood tests done to show:  How well your kidneys and liver are working.  How well your blood can clot.  General instructions  Plan to have someone take you home from the hospital or clinic.  If you will be going home right after the procedure, plan to have someone with you for 24 hours. What happens during the procedure?  Your blood pressure, heart rate, breathing, level of pain and overall condition will be monitored.  An IV tube will be inserted into one of your veins.  Your anesthesia specialist will give you medicines as needed to keep you comfortable during the procedure. This may mean changing the level of sedation.  The procedure will be performed. After the procedure  Your blood pressure, heart rate, breathing rate, and blood oxygen level will be monitored until the medicines you were given have worn off.  Do not drive for 24 hours if you received a sedative.  You may:  Feel sleepy, clumsy, or nauseous.  Feel forgetful about what happened after the procedure.  Have a sore throat if you had a breathing tube during the procedure.  Vomit. This information is not intended to replace advice given to you by your health care provider. Make sure you discuss any questions you have with your health care provider. Document Released: 05/14/2005 Document Revised: 01/25/2016 Document Reviewed: 12/09/2015 Elsevier Interactive Patient Education  2017 [place], Care  After These instructions provide you with information about caring for yourself after your procedure. Your health care provider may also give you  more specific instructions. Your treatment has been planned according to current medical practices, but problems sometimes occur. Call your health care provider if you have any problems or questions after your procedure. What can I expect after the procedure? After your procedure, it is common to:  Feel sleepy for several hours.  Feel clumsy and have poor balance for several hours.  Feel forgetful about what happened after the procedure.  Have poor judgment for several hours.  Feel nauseous or vomit.  Have a sore throat if you had a breathing tube during the procedure. Follow these instructions at home: For at least 24 hours after the procedure:    Do not:  Participate in activities in which you could fall or become injured.  Drive.  Use heavy machinery.  Drink alcohol.  Take sleeping pills or medicines that cause drowsiness.  Make important decisions or sign legal documents.  Take care of children on your own.  Rest. Eating and drinking   Follow the diet that is recommended by your health care provider.  If you vomit, drink water, juice, or soup when you can drink without vomiting.  Make sure you have little or no nausea before eating solid foods. General instructions   Have a responsible adult stay with you until you are awake and alert.  Take over-the-counter and prescription medicines only as told by your health care provider.  If you smoke, do not smoke without supervision.  Keep all follow-up visits as told by your health care provider. This is important. Contact a health care provider if:  You keep feeling nauseous or you keep vomiting.  You feel light-headed.  You develop a rash.  You have a fever. Get help right away if:  You have trouble breathing. This information is not intended to replace advice  given to you by your health care provider. Make sure you discuss any questions you have with your health care provider. Document Released: 12/09/2015 Document Revised: 04/09/2016 Document Reviewed: 12/09/2015 Elsevier Interactive Patient Education  2017 [employer].

## 2016-11-19 ENCOUNTER — Encounter (HOSPITAL_COMMUNITY)
Admission: RE | Admit: 2016-11-19 | Discharge: 2016-11-19 | Disposition: A | Discharge status: Home / Self Care | Payer: Medicaid Other | Source: Ambulatory Visit | Attending: Internal Medicine | Admitting: Internal Medicine

## 2016-11-19 ENCOUNTER — Encounter (HOSPITAL_COMMUNITY): Payer: Self-pay

## 2016-11-19 NOTE — Progress Notes (Signed)
   11/19/16 0920  OBSTRUCTIVE SLEEP APNEA  Have you ever been diagnosed with sleep apnea through a sleep study? No  Do you snore loudly (loud enough to be heard through closed doors)?  0  Do you often feel tired, fatigued, or sleepy during the daytime (such as falling asleep during driving or talking to someone)? 0  Has anyone observed you stop breathing during your sleep? 0  Do you have, or are you being treated for high blood pressure? 1  BMI more than 35 kg/m2? 1  Age > 50 (1-yes) 1  Neck circumference greater than:Male 16 inches or larger, Male 17inches or larger? 1  Male Gender (Yes=1) 1  Obstructive Sleep Apnea Score 5  Score 5 or greater  Results sent to PCP

## 2016-11-20 IMAGING — CR DG CHEST 1V PORT
1 series · 1 of 1 positions shown · non-contrast
Comparison: Portable chest x-ray May 30, 2015

CLINICAL DATA: Status post MI, current smoker.

EXAM:
PORTABLE CHEST 1 VIEW

[ap portable]
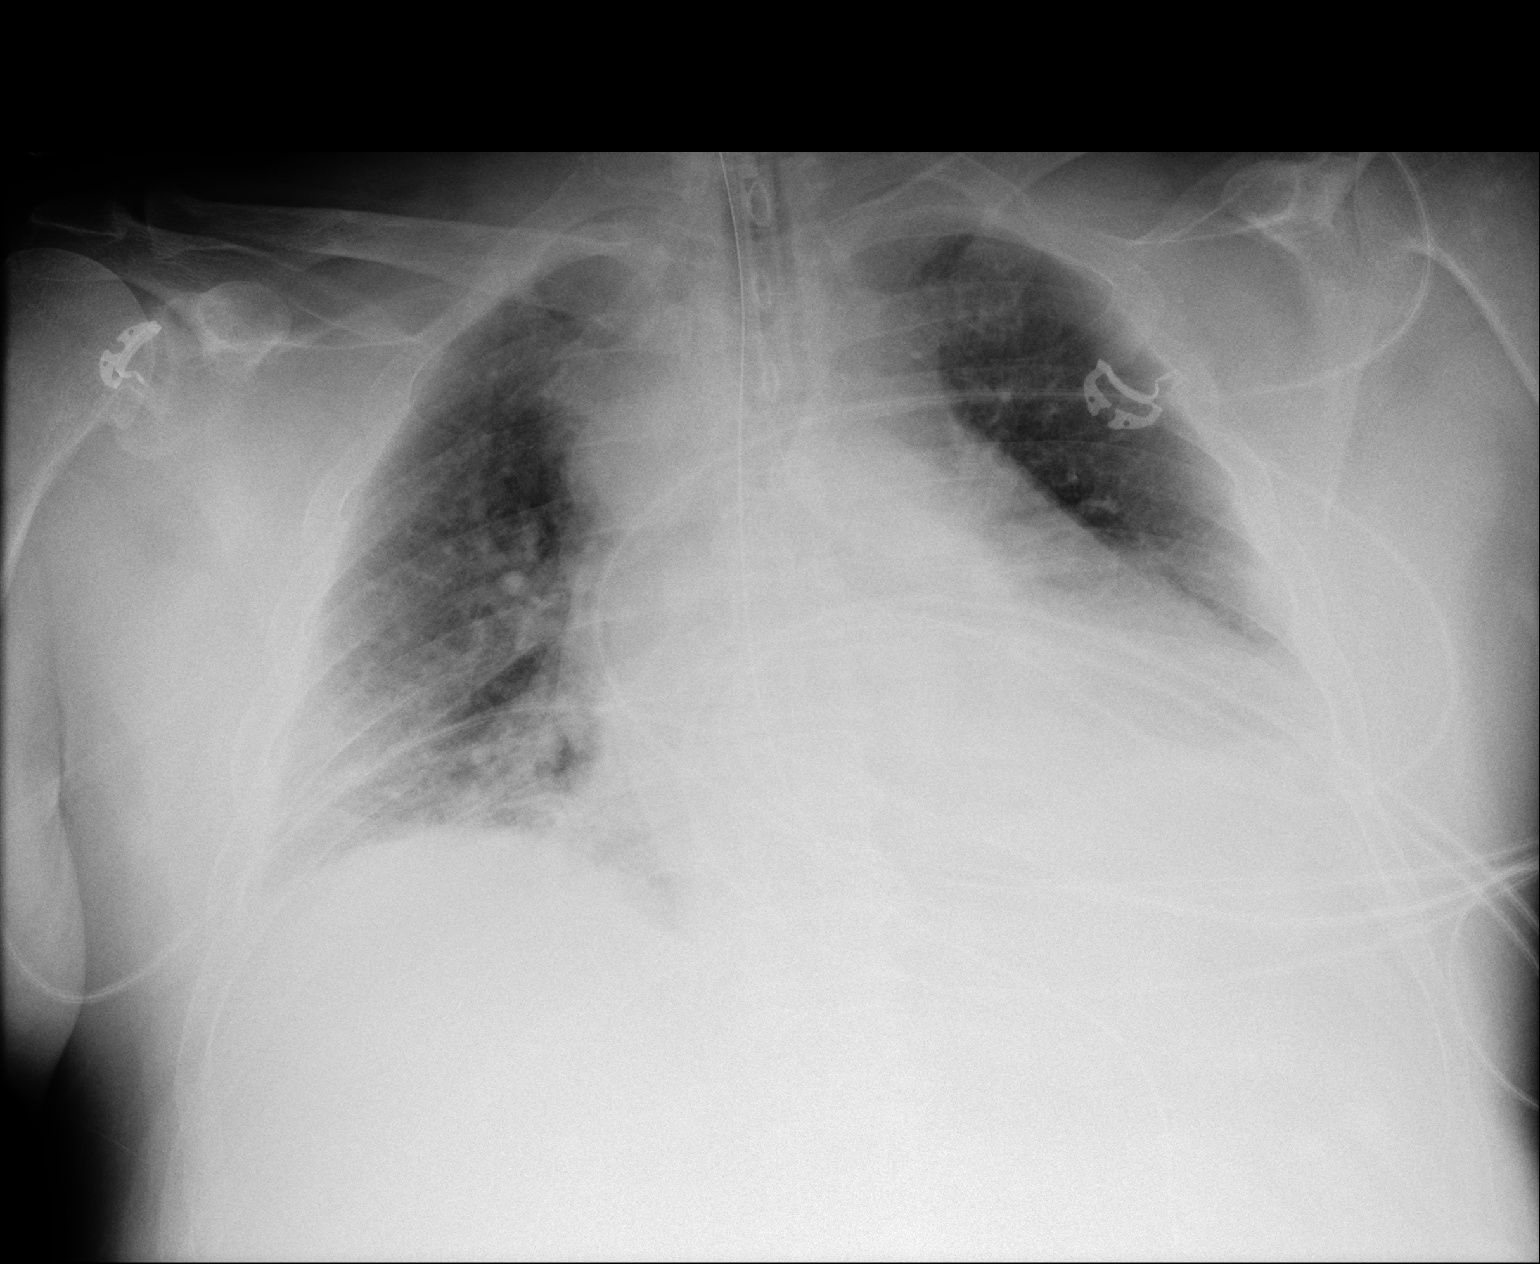

[1 of 1 positions shown; findings below may reference images not displayed]

FINDINGS: The lungs remain mildly hypoinflated. The interstitial markings
remain coarse especially in the right infrahilar region. The left
hemidiaphragm is obscured. The retrocardiac region on the left
remains dense. The cardiac silhouette remains enlarged. The
pulmonary vascularity is slightly less engorged today.

The endotracheal tube tip lies approximately 3.2 cm above the
carina. The esophagogastric tube tip projects below the inferior
margin of the image.
IMPRESSION: Persistent bilateral hypoinflation. There remains bibasilar
atelectasis greater on the left. Slight interval improvement in the
appearance of the pulmonary interstitium since yesterday's study may
reflect resolving interstitial edema.

## 2016-11-24 ENCOUNTER — Ambulatory Visit (HOSPITAL_COMMUNITY)
Admission: RE | Admit: 2016-11-24 | Discharge: 2016-11-24 | Disposition: A | Discharge status: Home / Self Care | Payer: Medicaid Other | Source: Ambulatory Visit | Attending: Internal Medicine | Admitting: Internal Medicine

## 2016-11-24 ENCOUNTER — Telehealth: Payer: Self-pay | Admitting: Internal Medicine

## 2016-11-24 ENCOUNTER — Other Ambulatory Visit: Payer: Self-pay | Admitting: Cardiology

## 2016-11-24 ENCOUNTER — Other Ambulatory Visit: Payer: Self-pay | Admitting: Physician Assistant

## 2016-11-24 ENCOUNTER — Encounter (HOSPITAL_COMMUNITY): Payer: Self-pay | Admitting: *Deleted

## 2016-11-24 ENCOUNTER — Ambulatory Visit (HOSPITAL_COMMUNITY): Payer: Medicaid Other | Admitting: Anesthesiology

## 2016-11-24 ENCOUNTER — Encounter (HOSPITAL_COMMUNITY)
Admission: RE | Disposition: A | Discharge status: Home / Self Care | Payer: Self-pay | Source: Ambulatory Visit | Attending: Internal Medicine

## 2016-11-24 DIAGNOSIS — Z7982 Long term (current) use of aspirin: Secondary | ICD-10-CM | POA: Insufficient documentation

## 2016-11-24 DIAGNOSIS — J449 Chronic obstructive pulmonary disease, unspecified: Secondary | ICD-10-CM | POA: Diagnosis not present

## 2016-11-24 DIAGNOSIS — Z888 Allergy status to other drugs, medicaments and biological substances status: Secondary | ICD-10-CM | POA: Diagnosis not present

## 2016-11-24 DIAGNOSIS — K5730 Diverticulosis of large intestine without perforation or abscess without bleeding: Secondary | ICD-10-CM | POA: Diagnosis not present

## 2016-11-24 DIAGNOSIS — F418 Other specified anxiety disorders: Secondary | ICD-10-CM | POA: Insufficient documentation

## 2016-11-24 DIAGNOSIS — K635 Polyp of colon: Secondary | ICD-10-CM | POA: Diagnosis not present

## 2016-11-24 DIAGNOSIS — Z881 Allergy status to other antibiotic agents status: Secondary | ICD-10-CM | POA: Diagnosis not present

## 2016-11-24 DIAGNOSIS — Z7951 Long term (current) use of inhaled steroids: Secondary | ICD-10-CM | POA: Diagnosis not present

## 2016-11-24 DIAGNOSIS — E119 Type 2 diabetes mellitus without complications: Secondary | ICD-10-CM | POA: Insufficient documentation

## 2016-11-24 DIAGNOSIS — F329 Major depressive disorder, single episode, unspecified: Secondary | ICD-10-CM | POA: Insufficient documentation

## 2016-11-24 DIAGNOSIS — Z955 Presence of coronary angioplasty implant and graft: Secondary | ICD-10-CM | POA: Insufficient documentation

## 2016-11-24 DIAGNOSIS — K921 Melena: Secondary | ICD-10-CM | POA: Diagnosis not present

## 2016-11-24 DIAGNOSIS — Z8249 Family history of ischemic heart disease and other diseases of the circulatory system: Secondary | ICD-10-CM | POA: Insufficient documentation

## 2016-11-24 DIAGNOSIS — K2270 Barrett's esophagus without dysplasia: Secondary | ICD-10-CM | POA: Diagnosis not present

## 2016-11-24 DIAGNOSIS — F17210 Nicotine dependence, cigarettes, uncomplicated: Secondary | ICD-10-CM | POA: Diagnosis not present

## 2016-11-24 DIAGNOSIS — I2510 Atherosclerotic heart disease of native coronary artery without angina pectoris: Secondary | ICD-10-CM | POA: Insufficient documentation

## 2016-11-24 DIAGNOSIS — Z9889 Other specified postprocedural states: Secondary | ICD-10-CM | POA: Diagnosis not present

## 2016-11-24 DIAGNOSIS — Z7984 Long term (current) use of oral hypoglycemic drugs: Secondary | ICD-10-CM | POA: Diagnosis not present

## 2016-11-24 DIAGNOSIS — F419 Anxiety disorder, unspecified: Secondary | ICD-10-CM | POA: Diagnosis not present

## 2016-11-24 DIAGNOSIS — K219 Gastro-esophageal reflux disease without esophagitis: Secondary | ICD-10-CM | POA: Diagnosis not present

## 2016-11-24 DIAGNOSIS — Z823 Family history of stroke: Secondary | ICD-10-CM | POA: Insufficient documentation

## 2016-11-24 DIAGNOSIS — D124 Benign neoplasm of descending colon: Secondary | ICD-10-CM | POA: Insufficient documentation

## 2016-11-24 DIAGNOSIS — I252 Old myocardial infarction: Secondary | ICD-10-CM | POA: Diagnosis not present

## 2016-11-24 DIAGNOSIS — I509 Heart failure, unspecified: Secondary | ICD-10-CM | POA: Insufficient documentation

## 2016-11-24 DIAGNOSIS — K5900 Constipation, unspecified: Secondary | ICD-10-CM | POA: Diagnosis not present

## 2016-11-24 HISTORY — PX: COLONOSCOPY WITH PROPOFOL: SHX5780

## 2016-11-24 HISTORY — PX: POLYPECTOMY: SHX5525

## 2016-11-24 LAB — GLUCOSE, CAPILLARY
Glucose-Capillary: 159 mg/dL — ABNORMAL HIGH (ref 65–99)
Glucose-Capillary: 154 mg/dL — ABNORMAL HIGH (ref 65–99)

## 2016-11-24 SURGERY — COLONOSCOPY WITH PROPOFOL
Anesthesia: Monitor Anesthesia Care

## 2016-11-24 MED ORDER — LIDOCAINE HCL (CARDIAC) 10 MG/ML IV SOLN
INTRAVENOUS | Status: DC | PRN
  Administered 2016-11-24: 50 mg via INTRAVENOUS

## 2016-11-24 MED ORDER — LACTATED RINGERS IV SOLN
INTRAVENOUS | Status: DC
  Administered 2016-11-24: 11:00:00 via INTRAVENOUS

## 2016-11-24 MED ORDER — SEVOFLURANE IN SOLN
RESPIRATORY_TRACT | Status: AC
  Filled 2016-11-24: qty 250

## 2016-11-24 MED ORDER — MIDAZOLAM HCL 2 MG/2ML IJ SOLN
INTRAMUSCULAR | Status: AC
  Filled 2016-11-24: qty 2

## 2016-11-24 MED ORDER — FENTANYL CITRATE (PF) 100 MCG/2ML IJ SOLN
25.0000 ug | INTRAMUSCULAR | Status: AC
  Administered 2016-11-24 (×2): 25 ug via INTRAVENOUS

## 2016-11-24 MED ORDER — PROPOFOL 10 MG/ML IV BOLUS
INTRAVENOUS | Status: AC
Start: 2016-11-24 — End: 2016-11-24
  Filled 2016-11-24: qty 60

## 2016-11-24 MED ORDER — MIDAZOLAM HCL 2 MG/2ML IJ SOLN
1.0000 mg | INTRAMUSCULAR | Status: AC
  Administered 2016-11-24 (×2): 2 mg via INTRAVENOUS

## 2016-11-24 MED ORDER — PROPOFOL 10 MG/ML IV BOLUS
INTRAVENOUS | Status: DC | PRN
  Administered 2016-11-24 (×3): 20 mg via INTRAVENOUS

## 2016-11-24 MED ORDER — FENTANYL CITRATE (PF) 100 MCG/2ML IJ SOLN
INTRAMUSCULAR | Status: AC
  Filled 2016-11-24: qty 2

## 2016-11-24 MED ORDER — PROPOFOL 500 MG/50ML IV EMUL
INTRAVENOUS | Status: DC | PRN
  Administered 2016-11-24: 100 ug/kg/min via INTRAVENOUS
  Administered 2016-11-24: 55 ug/kg/min via INTRAVENOUS

## 2016-11-24 MED ORDER — LIDOCAINE HCL (PF) 1 % IJ SOLN
INTRAMUSCULAR | Status: AC
  Filled 2016-11-24: qty 5

## 2016-11-24 NOTE — Telephone Encounter (Signed)
Pt called asking to speak with RMR nurse. He scheduled for a procedure today and said he was still going through "a phase".  Please call him back at [phone]

## 2016-11-24 NOTE — H&P (View-Only) (Signed)
Primary Care Physician: Soyla Dryer, PA-C  Primary Gastroenterologist:  Garfield Cornea, MD   Chief Complaint  Patient presents with  . Colonoscopy    had poor prep    HPI: Randy Hebert is a 51 y.o. male here to reschedule colonoscopy due to poor prep. He took Prepopik and states he followed the instructions as well as clear liquid instructions.   Patient has never had a colonoscopy. No known family history of colon cancer although his mother died of some sort of cancer but is unclear on the details. He reports having colitis last year. CT confirmed in August 2017. Beginning in the proximal descending colon and extending through the hepatic flexure into the ascending colon there was generalized colonic wall thickening. Associated mesenteric stranding at the hepatic flexure. Cecum and appendix were normal. TI was normal. Hepatic steatosis noted. Major arterial structures patent.  Since his procedure patient states he has been constipated. He's tried Colace and prune juice. History of intermittent bright red blood per rectum. No significant abdominal pain. Upper GI symptoms improved on pantoprazole. EGD earlier this month with LA grade a esophagitis, new diagnosis of Barrett's esophagus confirmed with biopsies.     Current Outpatient Prescriptions  Medication Sig Dispense Refill  . acetaminophen (TYLENOL) 500 MG tablet Take 500 mg by mouth every 6 (six) hours as needed for mild pain.    Marland Kitchen albuterol (PROVENTIL HFA;VENTOLIN HFA) 108 (90 Base) MCG/ACT inhaler Inhale 2 puffs into the lungs every 6 (six) hours as needed for wheezing or shortness of breath. 1 Inhaler 4  . albuterol (PROVENTIL) (2.5 MG/3ML) 0.083% nebulizer solution Take 3 mLs (2.5 mg total) by nebulization every 6 (six) hours as needed for wheezing or shortness of breath. 150 mL 1  . amLODipine (NORVASC) 10 MG tablet Take 1 tablet (10 mg total) by mouth daily. 30 tablet 4  . aspirin EC 81 MG tablet Take 1 tablet (81  mg total) by mouth 2 (two) times daily after a meal. (Patient taking differently: Take 81 mg by mouth daily. ) 60 tablet 0  . atorvastatin (LIPITOR) 20 MG tablet Take 1 tablet (20 mg total) by mouth daily. (Patient taking differently: Take 20 mg by mouth daily at 6 PM. ) 30 tablet 4  . Carboxymeth-Glycerin-Polysorb (REFRESH OPTIVE ADVANCED) 0.5-1-0.5 % SOLN Apply 1 drop to eye daily as needed (dry eyes).     . citalopram (CELEXA) 10 MG tablet Take 10 mg by mouth daily.    . cycloSPORINE (RESTASIS) 0.05 % ophthalmic emulsion Place 1 drop into both eyes 2 (two) times daily.    Marland Kitchen ibuprofen (ADVIL,MOTRIN) 200 MG tablet Take 600 mg by mouth every 6 (six) hours as needed for moderate pain.    . isosorbide mononitrate (IMDUR) 30 MG 24 hr tablet Take 0.5 tablets (15 mg total) by mouth daily. 45 tablet 3  . Menthol, Topical Analgesic, (BENGAY EX) Apply 1 application topically daily as needed (PAIN).     Marland Kitchen metFORMIN (GLUCOPHAGE) 1000 MG tablet Take 1 tablet (1,000 mg total) by mouth 2 (two) times daily with a meal. 60 tablet 4  . mometasone-formoterol (DULERA) 100-5 MCG/ACT AERO Inhale 2 puffs into the lungs 2 (two) times daily. 1 Inhaler 4  . nicotine (NICODERM CQ) 7 mg/24hr patch Place 1 patch (7 mg total) onto the skin daily. (Patient taking differently: Place 7 mg onto the skin daily. Does not take every day) 28 patch 1  . nitroGLYCERIN (NITROSTAT) 0.4 MG SL tablet  Place 1 tablet (0.4 mg total) under the tongue every 5 (five) minutes as needed for chest pain. 25 tablet 3  . Olopatadine HCl (PAZEO) 0.7 % SOLN Place 1 drop into both eyes daily.     . Omega-3 Fatty Acids (FISH OIL PO) Take 1,200 mg by mouth 2 (two) times daily.    Marland Kitchen oxymetazoline (AFRIN) 0.05 % nasal spray Place 1 spray into both nostrils 2 (two) times daily as needed for congestion.    . pantoprazole (PROTONIX) 40 MG tablet Take 1 tablet (40 mg total) by mouth daily. 30 tablet 11  . traZODone (DESYREL) 100 MG tablet Take 100 mg by mouth at  bedtime as needed for sleep.     . polyethylene glycol-electrolytes (TRILYTE) 420 g solution Take 4,000 mLs by mouth as directed. 4000 mL 0   No current facility-administered medications for this visit.     Allergies as of 10/28/2016 - Review Complete 10/28/2016  Allergen Reaction Noted  . Carvedilol Other (See Comments) 02/08/2015  . Lisinopril Anaphylaxis, Shortness Of Breath, and Swelling 05/30/2015  . Amoxicillin Nausea And Vomiting 02/06/2015   Past Medical History:  Diagnosis Date  . Anxiety   . Arthritis   . Back pain   . Bulging of cervical intervertebral disc   . CAD (coronary artery disease)    lateral STEMI 02/06/2015 00% D1 occlusion treated with Promus Premier 2.5 mm x 16 mm DES, 70% ramus stenosis, 40% mid RCA stenosis, 45% distal RCA stenosis, EF 45-50%  . CHF (congestive heart failure) ([place])   . COPD (chronic obstructive pulmonary disease) ([place])   . Depression   . Diabetes mellitus without complication ([place])   . 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 Dry eye   . Dyspnea   . GERD (gastroesophageal reflux disease)   . Headache   . Hip pain   . MI (myocardial infarction)   . Neck pain   . Otitis media   . Pleurisy   . Sinus pause    9 sec sinus pause on telemetry after started on coreg after MI, avoid AV nodal blocking agent   Past Surgical History:  Procedure Laterality Date  . BIOPSY  10/09/2016   Procedure: BIOPSY;  Surgeon: Daneil Dolin, MD;  [facility];  Service: Endoscopy;;  . CARDIAC CATHETERIZATION N/A 02/06/2015   Procedure: Left Heart Cath and Coronary Angiography;  Surgeon: Leonie Man, MD;  Location: [facility];  Service: Cardiovascular;  Laterality: N/A;  . CARDIAC CATHETERIZATION N/A 02/06/2015   Procedure: Coronary Stent Intervention;  Surgeon: Leonie Man, MD;  Location: [facility];  Service: Cardiovascular;  Laterality: N/A;  . COLONOSCOPY WITH PROPOFOL N/A 10/09/2016    Procedure: COLONOSCOPY WITH PROPOFOL;  Surgeon: Daneil Dolin, MD;  [facility];  Service: Endoscopy;  Laterality: N/A;  12:00 pm  . CORONARY ANGIOPLASTY WITH STENT PLACEMENT  01/2015  . ESOPHAGOGASTRODUODENOSCOPY (EGD) WITH PROPOFOL N/A 10/09/2016   Procedure: ESOPHAGOGASTRODUODENOSCOPY (EGD) WITH PROPOFOL;  Surgeon: Daneil Dolin, MD;  [facility];  Service: Endoscopy;  Laterality: N/A;  . INCISION / DRAINAGE HAND / FINGER    . TOTAL HIP ARTHROPLASTY Right 07/01/2016  . TOTAL HIP ARTHROPLASTY Right 07/01/2016   Procedure: RIGHT TOTAL HIP ARTHROPLASTY ANTERIOR APPROACH;  Surgeon: Mcarthur Rossetti, MD;  Location: [facility];  Service: Orthopedics;  Laterality: Right;   Family History  Problem Relation Age of Onset  .  Heart attack Father   . Stroke Father   . Cancer Mother     ???   Social History   Social History  . Marital status: Divorced    Spouse name: N/A  . Number of children: N/A  . Years of education: N/A   Social History Main Topics  . Smoking status: Current Some Day Smoker    Packs/day: 0.50    Years: 46.00    Types: Cigarettes    Start date: 06/07/1979  . Smokeless tobacco: Former Systems developer    Types: Chew     Comment: plans to get patches  . 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 now drinks on occasion.  . Drug use: No     Comment: history of drug use- marijuana, cocaine  . Sexual activity: Yes    Partners: Female    Birth control/ protection: None   Other Topics Concern  . None   Social History Narrative  . None    ROS:  General: Negative for anorexia, weight loss, fever, chills, fatigue, weakness. ENT: Negative for hoarseness, difficulty swallowing , nasal congestion. CV: Negative for chest pain, angina, palpitations, dyspnea on exertion, peripheral edema.  Respiratory: Negative for dyspnea at rest, dyspnea on exertion, cough, sputum, wheezing.    GI: See history of present illness. GU:  Negative for dysuria, hematuria, urinary incontinence, urinary frequency, nocturnal urination.  Endo: Negative for unusual weight change.    Physical Examination:   BP (!) 143/79   Pulse 90   Temp 98.2 F (36.8 C) (Oral)   Ht '5\' 5"'$  (1.651 m)   Wt 224 lb 6.4 oz (101.8 kg)   BMI 37.34 kg/m   General: Well-nourished, well-developed in no acute distress.  Eyes: No icterus. Mouth: Oropharyngeal mucosa moist and pink , no lesions erythema or exudate. Lungs: Clear to auscultation bilaterally.  Heart: Regular rate and rhythm, no murmurs rubs or gallops.  Abdomen: Bowel sounds are normal, nontender, nondistended, no hepatosplenomegaly or masses, no abdominal bruits or hernia , no rebound or guarding.   Extremities: No lower extremity edema. No clubbing or deformities. Neuro: Alert and oriented x 4   Skin: Warm and dry, no jaundice.   Psych: Alert and cooperative, normal mood and affect.    Impression/Plan:  51 year old gentleman presenting to reschedule colonoscopy due to recent inadequate bowel prep. Patient has a history of colitis with CT last year. Has some intermittent bright red blood per rectum, abdominal cramping and nervous stomach. No prior colonoscopy. Plan for colonoscopy with deep sedation in the OR. Modified bowel prep including 2 full days of clear liquids, begin Linzess 263mg daily for one week before bowel prep, samples provided. Plan for split TriLyte prep.  I have discussed the risks, alternatives, benefits with regards to but not limited to the risk of reaction to medication, bleeding, infection, perforation and the patient is agreeable to proceed. Written consent to be obtained.

## 2016-11-24 NOTE — Telephone Encounter (Signed)
Talked with patient and he said that someone from the hospital called and told him to come on to the hospital at 9:15 am. He told me that he was still drinking the second half of the prep. I told him to call the hospital and talk to who ever called him and tell them that he was still drinking his prep.

## 2016-11-24 NOTE — Anesthesia Procedure Notes (Signed)
Procedure Name: MAC Date/Time: 11/24/2016 10:49 AM Performed by: Vista Deck Pre-anesthesia Checklist: Patient identified, Emergency Drugs available, Suction available, Timeout performed and Patient being monitored Patient Re-evaluated:Patient Re-evaluated prior to inductionOxygen Delivery Method: Non-rebreather mask

## 2016-11-24 NOTE — Interval H&P Note (Signed)
History and Physical Interval Note:  11/24/2016 10:48 AM  Randy Hebert  has presented today for surgery, with the diagnosis of rectal bleeding  The various methods of treatment have been discussed with the patient and family. After consideration of risks, benefits and other options for treatment, the patient has consented to  Procedure(s) with comments: COLONOSCOPY WITH PROPOFOL (N/A) - 200 as a surgical intervention .  The patient's history has been reviewed, patient examined, no change in status, stable for surgery.  I have reviewed the patient's chart and labs.  Questions were answered to the patient's satisfaction.      No change. Colonoscopy per plan.  The risks, benefits, limitations, alternatives and imponderables have been reviewed with the patient. Questions have been answered. All parties are agreeable.  Manus Rudd

## 2016-11-24 NOTE — Anesthesia Preprocedure Evaluation (Addendum)
Anesthesia Evaluation  Patient identified by MRN, date of birth, ID band Patient awake    Reviewed: Allergy & Precautions, NPO status , Patient's Chart, lab work & pertinent test results  History of Anesthesia Complications (+) DIFFICULT AIRWAY  Airway Mallampati: II   Neck ROM: Full    Dental  (+) Teeth Intact, Poor Dentition, Dental Advisory Given   Pulmonary shortness of breath, COPD, Current Smoker,    breath sounds clear to auscultation       Cardiovascular hypertension, + CAD, + Past MI and +CHF   Rhythm:Regular Rate:Normal  Decreased EF Left ventricle: Moderately severe hypokinesis of mid   anterolateral segment, apical lateral segment, mid/apical   anterior segments. EF is 40%. The cavity size was normal. Wall thickness was increased in a pattern of mild LVH.   Neuro/Psych  Headaches, PSYCHIATRIC DISORDERS Anxiety Depression    GI/Hepatic GERD  ,  Endo/Other  diabetes, Poorly Controlled, Type 2  Renal/GU      Musculoskeletal  (+) Arthritis ,   Abdominal (+) + obese,   Peds  Hematology   Anesthesia Other Findings Hx of VC damage during emergency intubation for angioedema from lisinopril.   Reproductive/Obstetrics                             Anesthesia Physical Anesthesia Plan  ASA: III  Anesthesia Plan: MAC   Post-op Pain Management:    Induction: Intravenous  Airway Management Planned: Simple Face Mask  Additional Equipment:   Intra-op Plan:   Post-operative Plan:   Informed Consent: I have reviewed the patients History and Physical, chart, labs and discussed the procedure including the risks, benefits and alternatives for the proposed anesthesia with the patient or authorized representative who has indicated his/her understanding and acceptance.     Plan Discussed with:   Anesthesia Plan Comments:         Anesthesia Quick Evaluation

## 2016-11-24 NOTE — Anesthesia Postprocedure Evaluation (Signed)
Anesthesia Post Note  Patient: Randy Hebert  Procedure(s) Performed: Procedure(s) (LRB): COLONOSCOPY WITH PROPOFOL (N/A) POLYPECTOMY  Patient location during evaluation: PACU Anesthesia Type: MAC Level of consciousness: awake and alert Pain management: satisfactory to patient Vital Signs Assessment: post-procedure vital signs reviewed and stable Respiratory status: spontaneous breathing, nonlabored ventilation and patient connected to nasal cannula oxygen Cardiovascular status: stable Anesthetic complications: no     Last Vitals:  Vitals:   11/24/16 1045 11/24/16 1130  BP: 122/85 126/87  Pulse:  87  Resp: (!) 21 (!) 24  Temp:  (P) 36.5 C    Last Pain:  Vitals:   11/24/16 1023  TempSrc: Oral                 [name]

## 2016-11-24 NOTE — Transfer of Care (Signed)
Immediate Anesthesia Transfer of Care Note  Patient: Randy Hebert  Procedure(s) Performed: Procedure(s) with comments: COLONOSCOPY WITH PROPOFOL (N/A) - 200 POLYPECTOMY - descending and sigmoid  Patient Location: PACU  Anesthesia Type:MAC  Level of Consciousness: awake and alert   Airway & Oxygen Therapy: Patient Spontanous Breathing and Patient connected to nasal cannula oxygen  Post-op Assessment: Report given to RN and Post -op Vital signs reviewed and stable  Post vital signs: Reviewed and stable  Last Vitals:  Vitals:   11/24/16 1040 11/24/16 1045  BP: 127/84 122/85  Pulse:    Resp: (!) 32 (!) 21  Temp:      Last Pain:  Vitals:   11/24/16 1023  TempSrc: Oral      Patients Stated Pain Goal: 8 (88/32/54 9826)  Complications: No apparent anesthesia complications

## 2016-11-24 NOTE — Op Note (Signed)
[facility] Patient Name: Randy Hebert Procedure Date: 11/24/2016 10:43 AM MRN: [id number] Date of Birth: June 16, 1966 Attending MD: Norvel Richards , MD CSN: [id number] Age: 51 Admit Type: Outpatient Procedure:                Colonoscopy with multiple snare polypectomies Indications:              Hematochezia Providers:                Norvel Richards, MD, Lurline Del, RN, Purcell Nails.                            [place], Merchant navy officer Referring MD:              Medicines:                Propofol per Anesthesia Complications:            No immediate complications. Estimated Blood Loss:     Estimated blood loss was minimal. Procedure:                Pre-Anesthesia Assessment:                           - Prior to the procedure, a History and Physical                            was performed, and patient medications and                            allergies were reviewed. The patient's tolerance of                            previous anesthesia was also reviewed. The risks                            and benefits of the procedure and the sedation                            options and risks were discussed with the patient.                            All questions were answered, and informed consent                            was obtained. Prior Anticoagulants: The patient has                            taken no previous anticoagulant or antiplatelet                            agents. ASA Grade Assessment: III - A patient with                            severe systemic disease. After reviewing the risks  and benefits, the patient was deemed in                            satisfactory condition to undergo the procedure.                           After obtaining informed consent, the colonoscope                            was passed under direct vision. Throughout the                            procedure, the patient's blood pressure, pulse, and            oxygen saturations were monitored continuously. The                            EC-[id number] ([id number]) scope was introduced through                            the and advanced to the the cecum, identified by                            appendiceal orifice and ileocecal valve. The                            ileocecal valve, appendiceal orifice, and rectum                            were photographed. The entire colon was well                            visualized. The quality of the bowel preparation                            was adequate. Scope In: 10:59:53 AM Scope Out: 11:18:14 AM Scope Withdrawal Time: 0 hours 14 minutes 10 seconds  Total Procedure Duration: 0 hours 18 minutes 21 seconds  Findings:      The perianal and digital rectal examinations were normal.      Scattered small and large-mouthed diverticula were found in the sigmoid       colon and descending colon.      Four semi-pedunculated polyps were found in the sigmoid colon. The       polyps were 4 to 6 mm in size. These polyps were removed with a cold       snare. Resection and retrieval were complete. Estimated blood loss was       minimal.      A 4 mm polyp was found in the descending colon. The polyp was       semi-pedunculated. The polyp was removed with a cold snare. Resection       and retrieval were complete. Impression:               - Diverticulosis in the sigmoid colon and in the  descending colon.                           - Four 4 to 6 mm polyps in the sigmoid colon,                            removed with a cold snare. Resected and retrieved.                           - One 4 mm polyp in the descending colon, removed                            with a cold snare. Resected and retrieved. Moderate Sedation:      Moderate (conscious) sedation was personally administered by an       anesthesia professional. The following parameters were monitored: oxygen       saturation, heart  rate, blood pressure, respiratory rate, EKG, adequacy       of pulmonary ventilation, and response to care. Total physician       intraservice time was 25 minutes. Recommendation:           - Patient has a contact number available for                            emergencies. The signs and symptoms of potential                            delayed complications were discussed with the                            patient. Return to normal activities tomorrow.                            Written discharge instructions were provided to the                            patient.                           - Resume previous diet.                           - Continue present medications.                           - Repeat colonoscopy date to be determined after                            pending pathology results are reviewed for                            surveillance.                           - Return to GI clinic in 3 months. Procedure Code(s):        --- Professional ---  45385, Colonoscopy, flexible; with removal of                            tumor(s), polyp(s), or other lesion(s) by snare                            technique Diagnosis Code(s):        --- Professional ---                           D12.5, Benign neoplasm of sigmoid colon                           K92.1, Melena (includes Hematochezia)                           K57.30, Diverticulosis of large intestine without                            perforation or abscess without bleeding CPT copyright 2016 American Medical Association. All rights reserved. The codes documented in this report are preliminary and upon coder review may  be revised to meet current compliance requirements. Cristopher Estimable. Kennetta Pavlovic, MD Norvel Richards, MD 11/24/2016 11:31:58 AM This report has been signed electronically. Number of Addenda: 0

## 2016-11-24 NOTE — Discharge Instructions (Addendum)
Colonoscopy Discharge Instructions  Read the instructions outlined below and refer to this sheet in the next few weeks. These discharge instructions provide you with general information on caring for yourself after you leave the hospital. Your doctor may also give you specific instructions. While your treatment has been planned according to the most current medical practices available, unavoidable complications occasionally occur. If you have any problems or questions after discharge, call Dr. Gala Romney at [phone]. ACTIVITY  You may resume your regular activity, but move at a slower pace for the next 24 hours.   Take frequent rest periods for the next 24 hours.   Walking will help get rid of the air and reduce the bloated feeling in your belly (abdomen).   No driving for 24 hours (because of the medicine (anesthesia) used during the test).    Do not sign any important legal documents or operate any machinery for 24 hours (because of the anesthesia used during the test).  NUTRITION  Drink plenty of fluids.   You may resume your normal diet as instructed by your doctor.   Begin with a light meal and progress to your normal diet. Heavy or fried foods are harder to digest and may make you feel sick to your stomach (nauseated).   Avoid alcoholic beverages for 24 hours or as instructed.  MEDICATIONS  You may resume your normal medications unless your doctor tells you otherwise.  WHAT YOU CAN EXPECT TODAY  Some feelings of bloating in the abdomen.   Passage of more gas than usual.   Spotting of blood in your stool or on the toilet paper.  IF YOU HAD POLYPS REMOVED DURING THE COLONOSCOPY:  No aspirin products for 7 days or as instructed.   No alcohol for 7 days or as instructed.   Eat a soft diet for the next 24 hours.  FINDING OUT THE RESULTS OF YOUR TEST Not all test results are available during your visit. If your test results are not back during the visit, make an appointment  with your caregiver to find out the results. Do not assume everything is normal if you have not heard from your caregiver or the medical facility. It is important for you to follow up on all of your test results.  SEEK IMMEDIATE MEDICAL ATTENTION IF:  You have more than a spotting of blood in your stool.   Your belly is swollen (abdominal distention).   You are nauseated or vomiting.   You have a temperature over 101.   You have abdominal pain or discomfort that is severe or gets worse throughout the day.     Diverticulosis and colon polyp information provided  Further recommendations to follow pending review of pathology report     Diverticulosis Diverticulosis is a condition that develops when small pouches (diverticula) form in the wall of the large intestine (colon). The colon is where water is absorbed and stool is formed. The pouches form when the inside layer of the colon pushes through weak spots in the outer layers of the colon. You may have a few pouches or many of them. What are the causes? The cause of this condition is not known. What increases the risk? The following factors may make you more likely to develop this condition:  Being older than age 11. Your risk for this condition increases with age. Diverticulosis is rare among people younger than age 65. By age 23, many people have it.  Eating a low-fiber diet.  Having frequent constipation.  Being overweight.  Not getting enough exercise.  Smoking.  Taking over-the-counter pain medicines, like aspirin and ibuprofen.  Having a family history of diverticulosis. What are the signs or symptoms? In most people, there are no symptoms of this condition. If you do have symptoms, they may include:  Bloating.  Cramps in the abdomen.  Constipation or diarrhea.  Pain in the lower left side of the abdomen. How is this diagnosed? This condition is most often diagnosed during an exam for other colon problems.  Because diverticulosis usually has no symptoms, it often cannot be diagnosed independently. This condition may be diagnosed by:  Using a flexible scope to examine the colon (colonoscopy).  Taking an X-ray of the colon after dye has been put into the colon (barium enema).  Doing a CT scan. How is this treated? You may not need treatment for this condition if you have never developed an infection related to diverticulosis. If you have had an infection before, treatment may include:  Eating a high-fiber diet. This may include eating more fruits, vegetables, and grains.  Taking a fiber supplement.  Taking a live bacteria supplement (probiotic).  Taking medicine to relax your colon.  Taking antibiotic medicines. Follow these instructions at home:  Drink 6-8 glasses of water or more each day to prevent constipation.  Try not to strain when you have a bowel movement.  If you have had an infection before:  Eat more fiber as directed by your health care provider or your diet and nutrition specialist (dietitian).  Take a fiber supplement or probiotic, if your health care provider approves.  Take over-the-counter and prescription medicines only as told by your health care provider.  If you were prescribed an antibiotic, take it as told by your health care provider. Do not stop taking the antibiotic even if you start to feel better.  Keep all follow-up visits as told by your health care provider. This is important. Contact a health care provider if:  You have pain in your abdomen.  You have bloating.  You have cramps.  You have not had a bowel movement in 3 days. Get help right away if:  Your pain gets worse.  Your bloating becomes very bad.  You have a fever or chills, and your symptoms suddenly get worse.  You vomit.  You have bowel movements that are bloody or black.  You have bleeding from your rectum. Summary  Diverticulosis is a condition that develops when small  pouches (diverticula) form in the wall of the large intestine (colon).  You may have a few pouches or many of them.  This condition is most often diagnosed during an exam for other colon problems.  If you have had an infection related to diverticulosis, treatment may include increasing the fiber in your diet, taking supplements, or taking medicines. This information is not intended to replace advice given to you by your health care provider. Make sure you discuss any questions you have with your health care provider. Document Released: 05/15/2004 Document Revised: 07/07/2016 Document Reviewed: 07/07/2016 Elsevier Interactive Patient Education  2017 [place].    Colon Polyps Polyps are tissue growths inside the body. Polyps can grow in many places, including the large intestine (colon). A polyp may be a round bump or a mushroom-shaped growth. You could have one polyp or several. Most colon polyps are noncancerous (benign). However, some colon polyps can become cancerous over time. What are the causes? The exact cause of colon polyps is not known.  What increases the risk? This condition is more likely to develop in people who:  Have a family history of colon cancer or colon polyps.  Are older than 47 or older than 45 if they are African American.  Have inflammatory bowel disease, such as ulcerative colitis or Crohn disease.  Are overweight.  Smoke cigarettes.  Do not get enough exercise.  Drink too much alcohol.  Eat a diet that is:  High in fat and red meat.  Low in fiber.  Had childhood cancer that was treated with abdominal radiation. What are the signs or symptoms? Most polyps do not cause symptoms. If you have symptoms, they may include:  Blood coming from your rectum when having a bowel movement.  Blood in your stool.The stool may look dark red or black.  A change in bowel habits, such as constipation or diarrhea. How is this diagnosed? This condition is  diagnosed with a colonoscopy. This is a procedure that uses a lighted, flexible scope to look at the inside of your colon. How is this treated? Treatment for this condition involves removing any polyps that are found. Those polyps will then be tested for cancer. If cancer is found, your health care provider will talk to you about options for colon cancer treatment. Follow these instructions at home: Diet   Eat plenty of fiber, such as fruits, vegetables, and whole grains.  Eat foods that are high in calcium and vitamin D, such as milk, cheese, yogurt, eggs, liver, fish, and broccoli.  Limit foods high in fat, red meats, and processed meats, such as hot dogs, sausage, bacon, and lunch meats.  Maintain a healthy weight, or lose weight if recommended by your health care provider. General instructions   Do not smoke cigarettes.  Do not drink alcohol excessively.  Keep all follow-up visits as told by your health care provider. This is important. This includes keeping regularly scheduled colonoscopies. Talk to your health care provider about when you need a colonoscopy.  Exercise every day or as told by your health care provider. Contact a health care provider if:  You have new or worsening bleeding during a bowel movement.  You have new or increased blood in your stool.  You have a change in bowel habits.  You unexpectedly lose weight. This information is not intended to replace advice given to you by your health care provider. Make sure you discuss any questions you have with your health care provider. Document Released: 05/14/2004 Document Revised: 01/24/2016 Document Reviewed: 07/09/2015 Elsevier Interactive Patient Education  2017 [employer]  PATIENT INSTRUCTIONS POST-ANESTHESIA  IMMEDIATELY FOLLOWING SURGERY:  Do not drive or operate machinery for the first twenty four hours after surgery.  Do not make any important decisions for twenty four hours after surgery or while  taking narcotic pain medications or sedatives.  If you develop intractable nausea and vomiting or a severe headache please notify your doctor immediately.  FOLLOW-UP:  Please make an appointment with your surgeon as instructed. You do not need to follow up with anesthesia unless specifically instructed to do so.  WOUND CARE INSTRUCTIONS (if applicable):  Keep a dry clean dressing on the anesthesia/puncture wound site if there is drainage.  Once the wound has quit draining you may leave it open to air.  Generally you should leave the bandage intact for twenty four hours unless there is drainage.  If the epidural site drains for more than 36-48 hours please call the anesthesia department.  QUESTIONS?:  Please feel  free to call your physician or the hospital operator if you have any questions, and they will be happy to assist you.

## 2016-11-25 ENCOUNTER — Encounter: Payer: Self-pay | Admitting: Internal Medicine

## 2016-12-01 ENCOUNTER — Encounter (HOSPITAL_COMMUNITY): Payer: Self-pay | Admitting: Internal Medicine

## 2016-12-11 ENCOUNTER — Ambulatory Visit (INDEPENDENT_AMBULATORY_CARE_PROVIDER_SITE_OTHER): Payer: Medicaid Other | Admitting: Cardiology

## 2016-12-11 ENCOUNTER — Encounter: Payer: Self-pay | Admitting: Cardiology

## 2016-12-11 VITALS — BP 122/70 | HR 89 | Ht 65.0 in | Wt 228.0 lb

## 2016-12-11 DIAGNOSIS — I10 Essential (primary) hypertension: Secondary | ICD-10-CM

## 2016-12-11 DIAGNOSIS — I2510 Atherosclerotic heart disease of native coronary artery without angina pectoris: Secondary | ICD-10-CM | POA: Diagnosis not present

## 2016-12-11 DIAGNOSIS — E782 Mixed hyperlipidemia: Secondary | ICD-10-CM | POA: Diagnosis not present

## 2016-12-11 MED ORDER — SILDENAFIL CITRATE 100 MG PO TABS: 50.0000 mg | ORAL_TABLET | Freq: Every day | ORAL | 11 refills | Status: DC | PRN

## 2016-12-11 NOTE — Patient Instructions (Signed)
Medication Instructions:  I have sent in a prescription for Viagra 100 mg- You are to take 50 mg (1/2 pill) 30 minutes prior to sexual activity  IF YOU START TAKING THE VIAGRA- YOU WILL HAVE TO STOP TAKING THE IMDUR !!!!  Labwork: NONE  Testing/Procedures: NONE  Follow-Up: Your physician wants you to follow-up in: 6 MONTHS .  You will receive a reminder letter in the mail two months in advance. If you don't receive a letter, please call our office to schedule the follow-up appointment.  Any Other Special Instructions Will Be Listed Below (If Applicable).     If you need a refill on your cardiac medications before your next appointment, please call your pharmacy.

## 2016-12-11 NOTE — Progress Notes (Signed)
Clinical Summary [name] is a 51 y.o.male seen today for follow up of the following medical problems.   1. CAD - admit 01/2015 with lateral STEMI, received DES to D1. There was a 70% ramus and 40% mid RCA that was medically managed - 01/2015 echo LVEF 40%, lateral wall hypokinesis - no beta blocker due to history of sinus pause and bradycardia. No ACE-I given history of severe angioedema, no ARB due to risk of cross reactivity - admit 12/2015 with atypical chest pain, worst with palpatoin and movement. Had done some heavy lifting the day it started, negative workup for ACS. Since that time no recurrent symptoms  - exercise capacity is increasing. Occasional back pain that can radiate into chest.     2. SOB/COPD - 07/2015 PFTs: mild to mod ventilatory defect with small airway obstruction.  - 07/2015 CT PE no PE, though suboptimal study - 07/2015 CXR no acute process.  PFTs with mild to mod vent defect, ABGs showed borderline resting hypoxemia.   - no recent symptoms.     3. Hyperlipidemia - he reports anxiety on crestor, he stopped taking.  - he has had troubles affording alternative statins. Currently on atorva '20mg'$  daily.  -last lipid panel  10/2016 TC 112 TG 214 HDL 20 LDL 49    4. Palpitations - fairly mild infrequent symptoms .  Past Medical History:  Diagnosis Date  . Anxiety   . Arthritis   . Back pain   . Bulging of cervical intervertebral disc   . CAD (coronary artery disease)    lateral STEMI 02/06/2015 00% D1 occlusion treated with Promus Premier 2.5 mm x 16 mm DES, 70% ramus stenosis, 40% mid RCA stenosis, 45% distal RCA stenosis, EF 45-50%  . CHF (congestive heart failure) ([place])   . COPD (chronic obstructive pulmonary disease) ([place])   . Depression   . Diabetes mellitus without complication ([place])   . 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 Dry eye   . Dyspnea   . GERD (gastroesophageal reflux  disease)   . Headache   . Hip pain   . MI (myocardial infarction)   . Neck pain   . Otitis media   . Pleurisy   . Sinus pause    9 sec sinus pause on telemetry after started on coreg after MI, avoid AV nodal blocking agent     Allergies  Allergen Reactions  . Carvedilol Other (See Comments)    Sinus pause on telemetry >3 seconds. Longest one 9 sec. No AV nodal agent  . Lisinopril Anaphylaxis, Shortness Of Breath and Swelling    Angioedema, required intubation and mechanical ventilation  . Amoxicillin Nausea And Vomiting    Has patient had a PCN reaction causing immediate rash, facial/tongue/throat swelling, SOB or lightheadedness with hypotension: Unknown Has patient had a PCN reaction causing severe rash involving mucus membranes or skin necrosis: Unknown Has patient had a PCN reaction that required hospitalization Unknown Has patient had a PCN reaction occurring within the last 10 years: No If all of the above answers are "NO", then may proceed with Cephalosporin use.      Current Outpatient Prescriptions  Medication Sig Dispense Refill  . acetaminophen (TYLENOL) 500 MG tablet Take 500 mg by mouth every 6 (six) hours as needed for mild pain.    Marland Kitchen albuterol (PROVENTIL HFA;VENTOLIN HFA) 108 (90 Base) MCG/ACT inhaler Inhale 2 puffs into the lungs every 6 (six) hours  as needed for wheezing or shortness of breath. 1 Inhaler 4  . albuterol (PROVENTIL) (2.5 MG/3ML) 0.083% nebulizer solution Take 3 mLs (2.5 mg total) by nebulization every 6 (six) hours as needed for wheezing or shortness of breath. 150 mL 1  . amLODipine (NORVASC) 10 MG tablet Take 1 tablet (10 mg total) by mouth daily. 30 tablet 4  . aspirin EC 81 MG tablet Take 1 tablet (81 mg total) by mouth 2 (two) times daily after a meal. (Patient taking differently: Take 81 mg by mouth daily. ) 60 tablet 0  . atorvastatin (LIPITOR) 20 MG tablet Take 1 tablet (20 mg total) by mouth daily. (Patient taking differently: Take 20 mg by  mouth daily at 6 PM. ) 30 tablet 4  . Carboxymeth-Glycerin-Polysorb (REFRESH OPTIVE ADVANCED) 0.5-1-0.5 % SOLN Apply 1 drop to eye 3 (three) times daily as needed (dry eyes).     . citalopram (CELEXA) 10 MG tablet Take 10 mg by mouth daily.    . cycloSPORINE (RESTASIS) 0.05 % ophthalmic emulsion Place 1 drop into both eyes 2 (two) times daily.    Marland Kitchen ibuprofen (ADVIL,MOTRIN) 200 MG tablet Take 600-800 mg by mouth every 6 (six) hours as needed (for pain/headaches.).     Marland Kitchen isosorbide mononitrate (IMDUR) 30 MG 24 hr tablet Take 0.5 tablets (15 mg total) by mouth daily. 45 tablet 3  . isosorbide mononitrate (IMDUR) 30 MG 24 hr tablet TAKE 1/2 Tablet BY MOUTH ONCE DAILY 45 tablet 3  . Menthol, Topical Analgesic, (BENGAY EX) Apply 1 application topically 2 (two) times daily as needed (for muscle aches/pain.).     Marland Kitchen metFORMIN (GLUCOPHAGE) 1000 MG tablet Take 1 tablet (1,000 mg total) by mouth 2 (two) times daily with a meal. 60 tablet 4  . mometasone-formoterol (DULERA) 100-5 MCG/ACT AERO Inhale 2 puffs into the lungs 2 (two) times daily. 1 Inhaler 4  . nicotine (NICODERM CQ) 7 mg/24hr patch Place 1 patch (7 mg total) onto the skin daily. (Patient taking differently: Place 7 mg onto the skin once a week. ) 28 patch 1  . nitroGLYCERIN (NITROSTAT) 0.4 MG SL tablet Place 1 tablet (0.4 mg total) under the tongue every 5 (five) minutes as needed for chest pain. 25 tablet 3  . Olopatadine HCl (PAZEO) 0.7 % SOLN Place 1 drop into both eyes daily.     . Omega-3 Fatty Acids (FISH OIL) 1200 MG CAPS Take 1,200 mg by mouth 2 (two) times daily.    Marland Kitchen oxymetazoline (AFRIN) 0.05 % nasal spray Place 1 spray into both nostrils 2 (two) times daily as needed for congestion.    . pantoprazole (PROTONIX) 40 MG tablet Take 1 tablet (40 mg total) by mouth daily. 30 tablet 11  . polyethylene glycol-electrolytes (TRILYTE) 420 g solution Take 4,000 mLs by mouth as directed. 4000 mL 0  . traZODone (DESYREL) 100 MG tablet Take 100 mg by  mouth at bedtime.      No current facility-administered medications for this visit.      Past Surgical History:  Procedure Laterality Date  . BIOPSY  10/09/2016   Procedure: BIOPSY;  Surgeon: Daneil Dolin, MD;  [facility];  Service: Endoscopy;;  . CARDIAC CATHETERIZATION N/A 02/06/2015   Procedure: Left Heart Cath and Coronary Angiography;  Surgeon: Leonie Man, MD;  Location: [facility];  Service: Cardiovascular;  Laterality: N/A;  . CARDIAC CATHETERIZATION N/A 02/06/2015   Procedure: Coronary Stent Intervention;  Surgeon: Leonie Man, MD;  Location: [facility];  Service: Cardiovascular;  Laterality: N/A;  . COLONOSCOPY WITH PROPOFOL N/A 10/09/2016   Procedure: COLONOSCOPY WITH PROPOFOL;  Surgeon: Daneil Dolin, MD;  [facility];  Service: Endoscopy;  Laterality: N/A;  12:00 pm  . COLONOSCOPY WITH PROPOFOL N/A 11/24/2016   Procedure: COLONOSCOPY WITH PROPOFOL;  Surgeon: Daneil Dolin, MD;  [facility];  Service: Endoscopy;  Laterality: N/A;  200  . CORONARY ANGIOPLASTY WITH STENT PLACEMENT  01/2015  . ESOPHAGOGASTRODUODENOSCOPY (EGD) WITH PROPOFOL N/A 10/09/2016   Procedure: ESOPHAGOGASTRODUODENOSCOPY (EGD) WITH PROPOFOL;  Surgeon: Daneil Dolin, MD;  [facility];  Service: Endoscopy;  Laterality: N/A;  . INCISION / DRAINAGE HAND / FINGER    . POLYPECTOMY  11/24/2016   Procedure: POLYPECTOMY;  Surgeon: Daneil Dolin, MD;  [facility];  Service: Endoscopy;;  descending and sigmoid  . TOTAL HIP ARTHROPLASTY Right 07/01/2016  . TOTAL HIP ARTHROPLASTY Right 07/01/2016   Procedure: RIGHT TOTAL HIP ARTHROPLASTY ANTERIOR APPROACH;  Surgeon: Mcarthur Rossetti, MD;  Location: [facility];  Service: Orthopedics;  Laterality: Right;     Allergies  Allergen Reactions  . Carvedilol Other (See Comments)    Sinus pause on telemetry >3 seconds. Longest one 9 sec. No AV nodal agent  . Lisinopril Anaphylaxis, Shortness Of  Breath and Swelling    Angioedema, required intubation and mechanical ventilation  . Amoxicillin Nausea And Vomiting    Has patient had a PCN reaction causing immediate rash, facial/tongue/throat swelling, SOB or lightheadedness with hypotension: Unknown Has patient had a PCN reaction causing severe rash involving mucus membranes or skin necrosis: Unknown Has patient had a PCN reaction that required hospitalization Unknown Has patient had a PCN reaction occurring within the last 10 years: No If all of the above answers are "NO", then may proceed with Cephalosporin use.       Family History  Problem Relation Age of Onset  . Heart attack Father   . Stroke Father   . Cancer Mother     ???     Social History Mr. Mesa reports that he has been smoking Cigarettes.  He started smoking about 37 years ago. He has a 23.00 pack-year smoking history. He has quit using smokeless tobacco. His smokeless tobacco use included Chew. Mr. Simson reports that he drinks about 3.0 - 3.6 oz of alcohol per week .   Review of Systems CONSTITUTIONAL: No weight loss, fever, chills, weakness or fatigue.  HEENT: Eyes: No visual loss, blurred vision, double vision or yellow sclerae.No hearing loss, sneezing, congestion, runny nose or sore throat.  SKIN: No rash or itching.  CARDIOVASCULAR: per hpi RESPIRATORY: No shortness of breath, cough or sputum.  GASTROINTESTINAL: No anorexia, nausea, vomiting or diarrhea. No abdominal pain or blood.  GENITOURINARY: No burning on urination, no polyuria NEUROLOGICAL: No headache, dizziness, syncope, paralysis, ataxia, numbness or tingling in the extremities. No change in bowel or bladder control.  MUSCULOSKELETAL: No muscle, back pain, joint pain or stiffness.  LYMPHATICS: No enlarged nodes. No history of splenectomy.  PSYCHIATRIC: No history of depression or anxiety.  ENDOCRINOLOGIC: No reports of sweating, cold or heat intolerance. No polyuria or polydipsia.   Marland Kitchen   Physical Examination Vitals:   12/11/16 1045  BP: 122/70  Pulse: 89   Vitals:   12/11/16 1045  Weight: 228 lb (103.4 kg)  Height: '5\' 5"'$  (1.651 m)    Gen: resting comfortably, no acute distress HEENT: no scleral icterus,  pupils equal round and reactive, no palptable cervical adenopathy,  CV: RRR, n m/r/g no jvd Resp: Clear to auscultation bilaterally GI: abdomen is soft, non-tender, non-distended, normal bowel sounds, no hepatosplenomegaly MSK: extremities are warm, no edema.  Skin: warm, no rash Neuro:  no focal deficits Psych: appropriate affect   Diagnostic Studies 01/2015 cath 1. 1st Diag lesion, 100% stenosed. A Promus Premier 2.5 mm x 16 mm drug-eluting stent was placed. There is a 0% residual stenosis post intervention. 2. Ramus lesion, 70% stenosed. 3. Mid RCA lesion, 40% stenosed. Dist RCA lesion, 45% stenosed. 4. Mild to moderately reduced LVEF with anterolateral and apical hypokinesis and elevated LVEDP  Abnormal anatomy with equal sized Diagonal and LAD Aleayah [place] but extensive RCA. Successful PCI of the First Diagonal Amberia Bayless.  Recommendations:  Standard post radial cath/PCI TR band removal  Dual independent therapy for minimum one year.  Check 2-D echocardiogram to better assess EF  Add statin and beta blocker. Smoking cessation counseling. Cardiac Rehabilitation And Case Management Consultation  If hemodynamic stable, would consider fast-track discharge  Would consider noninvasive evaluation of the Ramus Intermedius lesion in the absence of any recurrent anginal pain.  01/2015 echo Study Conclusions  - Left ventricle: Moderately severe hypokinesis of mid anterolateral segment, apical lateral segment, mid/apical anterior segments. EF is 40%. The cavity size was normal. Wall thickness was increased in a pattern of mild LVH. - Aortic valve: Sclerosis without stenosis. There was no significant regurgitation. - Right ventricle: The cavity  size was normal. Systolic function was normal.  07/2015 CT PE IMPRESSION: 1. Suboptimal contrast bolus timing in the pulmonary arterial tree, poor in the lobar vessels other than the right lower lobe. No central pulmonary embolus. If there is continued suspicion of acute PE nuclear medicine V/Q scan may be most helpful. 2. Situs abnormality in the chest with right side aortic arch and descending aorta. Mirror image branching. Other visible situs including cardiac and upper abdominal situs appear normal. 3. Calcified Coronary artery atherosclerosis. Hepatomegaly with fatty liver disease.  07/2015 PFTs: mild to mod ventilatory defect with small airway obstruction, moderately reduced DLCO.      Assessment and Plan   1. CAD - no current symptoms - he will  current meds  2. COPD - no recent symptoms, continue to monitor.   3. Hyperlipideima - reports anxiety on crestor. Difficulty affording statins, currently on atorva '20mg'$  daily. - continue current staitn   4. HTN -bp is  at goal, he will continue current meds.    5. Erectile dysfunction - given Rx for viagra. He is not sure if he will fill or not but did ask for. Counseled extnesively if he starts viagra he must stop his imdur, he reports he understands.    F/u 6 months     Arnoldo Lenis, M.D.

## 2016-12-22 ENCOUNTER — Emergency Department (HOSPITAL_COMMUNITY)
Admission: EM | Admit: 2016-12-22 | Discharge: 2016-12-22 | Disposition: A | Discharge status: Home / Self Care | Payer: Medicaid Other | Attending: Emergency Medicine | Admitting: Emergency Medicine

## 2016-12-22 ENCOUNTER — Encounter (HOSPITAL_COMMUNITY): Payer: Self-pay

## 2016-12-22 DIAGNOSIS — F17210 Nicotine dependence, cigarettes, uncomplicated: Secondary | ICD-10-CM | POA: Insufficient documentation

## 2016-12-22 DIAGNOSIS — Z7984 Long term (current) use of oral hypoglycemic drugs: Secondary | ICD-10-CM | POA: Diagnosis not present

## 2016-12-22 DIAGNOSIS — Y999 Unspecified external cause status: Secondary | ICD-10-CM | POA: Diagnosis not present

## 2016-12-22 DIAGNOSIS — I110 Hypertensive heart disease with heart failure: Secondary | ICD-10-CM | POA: Diagnosis not present

## 2016-12-22 DIAGNOSIS — Y9389 Activity, other specified: Secondary | ICD-10-CM | POA: Diagnosis not present

## 2016-12-22 DIAGNOSIS — Z7982 Long term (current) use of aspirin: Secondary | ICD-10-CM | POA: Diagnosis not present

## 2016-12-22 DIAGNOSIS — I2510 Atherosclerotic heart disease of native coronary artery without angina pectoris: Secondary | ICD-10-CM | POA: Insufficient documentation

## 2016-12-22 DIAGNOSIS — S3992XA Unspecified injury of lower back, initial encounter: Secondary | ICD-10-CM | POA: Diagnosis present

## 2016-12-22 DIAGNOSIS — I5022 Chronic systolic (congestive) heart failure: Secondary | ICD-10-CM | POA: Insufficient documentation

## 2016-12-22 DIAGNOSIS — I252 Old myocardial infarction: Secondary | ICD-10-CM | POA: Diagnosis not present

## 2016-12-22 DIAGNOSIS — J449 Chronic obstructive pulmonary disease, unspecified: Secondary | ICD-10-CM | POA: Insufficient documentation

## 2016-12-22 DIAGNOSIS — X501XXA Overexertion from prolonged static or awkward postures, initial encounter: Secondary | ICD-10-CM | POA: Diagnosis not present

## 2016-12-22 DIAGNOSIS — Y929 Unspecified place or not applicable: Secondary | ICD-10-CM | POA: Insufficient documentation

## 2016-12-22 DIAGNOSIS — E119 Type 2 diabetes mellitus without complications: Secondary | ICD-10-CM | POA: Diagnosis not present

## 2016-12-22 DIAGNOSIS — Z79899 Other long term (current) drug therapy: Secondary | ICD-10-CM | POA: Diagnosis not present

## 2016-12-22 DIAGNOSIS — M545 Low back pain, unspecified: Secondary | ICD-10-CM

## 2016-12-22 DIAGNOSIS — S39012A Strain of muscle, fascia and tendon of lower back, initial encounter: Secondary | ICD-10-CM | POA: Insufficient documentation

## 2016-12-22 MED ORDER — IBUPROFEN 600 MG PO TABS: 600.0000 mg | ORAL_TABLET | Freq: Four times a day (QID) | ORAL | 0 refills | Status: DC | PRN

## 2016-12-22 MED ORDER — IBUPROFEN 400 MG PO TABS
400.0000 mg | ORAL_TABLET | Freq: Once | ORAL | Status: AC
  Administered 2016-12-22: 400 mg via ORAL
  Filled 2016-12-22: qty 1

## 2016-12-22 MED ORDER — METHOCARBAMOL 500 MG PO TABS
500.0000 mg | ORAL_TABLET | Freq: Once | ORAL | Status: AC
  Administered 2016-12-22: 500 mg via ORAL
  Filled 2016-12-22: qty 1

## 2016-12-22 MED ORDER — METHOCARBAMOL 500 MG PO TABS: 500.0000 mg | ORAL_TABLET | Freq: Two times a day (BID) | ORAL | 0 refills | Status: DC

## 2016-12-22 NOTE — Discharge Instructions (Signed)
Please read and follow all provided instructions.  Your diagnoses today include:  1. Acute left-sided low back pain without sciatica   2. Strain of lumbar region, initial encounter     Tests performed today include: Vital signs - see below for your results today  Medications prescribed:   Take any prescribed medications only as directed.  Home care instructions:  Follow any educational materials contained in this packet Please rest, use ice or heat on your back for the next several days Do not lift, push, pull anything more than 10 pounds for the next week  Follow-up instructions: Please follow-up with your primary care provider in the next 1 week for further evaluation of your symptoms.   Return instructions:  SEEK IMMEDIATE MEDICAL ATTENTION IF YOU HAVE: New numbness, tingling, weakness, or problem with the use of your arms or legs Severe back pain not relieved with medications Loss control of your bowels or bladder Increasing pain in any areas of the body (such as chest or abdominal pain) Shortness of breath, dizziness, or fainting.  Worsening nausea (feeling sick to your stomach), vomiting, fever, or sweats Any other emergent concerns regarding your health   Additional Information:  Your vital signs today were: BP 131/88 (BP Location: Right Arm)    Pulse 99    Temp 97.8 F (36.6 C) (Oral)    Resp 18    Ht '5\' 5"'$  (1.651 m)    Wt 103.4 kg    SpO2 96%    BMI 37.94 kg/m  If your blood pressure (BP) was elevated above 135/85 this visit, please have this repeated by your doctor within one month. --------------

## 2016-12-22 NOTE — ED Triage Notes (Signed)
Pt reports woke up at 4 am, bent over to get a water bottle and felt something "pop" in lower back.  C/O pain to left lower back, worse with movement.

## 2016-12-22 NOTE — ED Provider Notes (Signed)
[facility] DEPT Provider Note   CSN: [id number] Arrival date & time: 12/22/16  0756     History   Chief Complaint Chief Complaint  Patient presents with  . Back Pain    HPI EDREI NORGAARD is a 51 y.o. male.  HPI   51 y.o. male presents to the Emergency Department today complaining of back pain. Noted back pain around 4am after pbending ovber and reaching for water bottle. Noted pain upon straightening back. Notes worse with movement. Relieved with rest and sitting up straight. No N/V. No dysuria. No lsos of bowel or bladder function. No saddle anesthesia. Notes aching sensation and rates pain 5/10. No fevers. No other symptoms noted.   Past Medical History:  Diagnosis Date  . Anxiety   . Arthritis   . Back pain   . Bulging of cervical intervertebral disc   . CAD (coronary artery disease)    lateral STEMI 02/06/2015 00% D1 occlusion treated with Promus Premier 2.5 mm x 16 mm DES, 70% ramus stenosis, 40% mid RCA stenosis, 45% distal RCA stenosis, EF 45-50%  . CHF (congestive heart failure) ([place])   . COPD (chronic obstructive pulmonary disease) ([place])   . Depression   . Diabetes mellitus without complication ([place])   . 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 Dry eye   . Dyspnea   . GERD (gastroesophageal reflux disease)   . Headache   . Hip pain   . MI (myocardial infarction) ([place])   . Neck pain   . Otitis media   . Pleurisy   . Sinus pause    9 sec sinus pause on telemetry after started on coreg after MI, avoid AV nodal blocking agent    Patient Active Problem List   Diagnosis Date Noted  . GERD (gastroesophageal reflux disease) 09/23/2016  . Early satiety 09/23/2016  . Abdominal pain, chronic, epigastric 09/23/2016  . Rectal bleeding 09/23/2016  . Unilateral primary osteoarthritis, right hip 07/01/2016  . 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 Type 2 diabetes mellitus without complication, without  long-term current use of insulin ([place]) 04/26/2016  . Right hip pain 12/19/2015  . Chest pain 12/10/2015  . Chronic obstructive pulmonary disease ([place]) 08/27/2015  . Cigarette nicotine dependence with nicotine-induced disorder 08/27/2015  . Hyperlipidemia 08/27/2015  . Obesity, unspecified 08/27/2015  . Dyspnea 07/11/2015  . Acute respiratory failure with hypoxia ([place]) 06/02/2015  . Acute respiratory failure with hypoxemia ([place]) 06/02/2015  . Acute on chronic systolic heart failure ([place]) 06/02/2015  . ACE inhibitor-aggravated angioedema   . Chronic systolic CHF (congestive heart failure) ([place]) 06/01/2015  . CAD (coronary artery disease) 05/31/2015  . STEMI (ST elevation myocardial infarction) ([place]) 05/31/2015  . Tobacco use disorder 05/31/2015  . Angioedema 05/30/2015  . Sinus pause secondary to beta blocker 02/09/2015  . Essential hypertension 02/09/2015  . ST elevation myocardial infarction (STEMI) of lateral wall, initial episode of care [facility]) 02/06/2015    Class: Hospitalized for    Past Surgical History:  Procedure Laterality Date  . BIOPSY  10/09/2016   Procedure: BIOPSY;  Surgeon: Daneil Dolin, MD;  [facility];  Service: Endoscopy;;  . CARDIAC CATHETERIZATION N/A 02/06/2015   Procedure: Left Heart Cath and Coronary Angiography;  Surgeon: Leonie Man, MD;  Location: [facility];  Service: Cardiovascular;  Laterality: N/A;  . CARDIAC CATHETERIZATION N/A 02/06/2015   Procedure: Coronary Stent Intervention;  Surgeon: Leonie Green  Ellyn Hack, MD;  Location: [facility] LAB;  Service: Cardiovascular;  Laterality: N/A;  . COLONOSCOPY WITH PROPOFOL N/A 10/09/2016   Procedure: COLONOSCOPY WITH PROPOFOL;  Surgeon: Daneil Dolin, MD;  [facility];  Service: Endoscopy;  Laterality: N/A;  12:00 pm  . COLONOSCOPY WITH PROPOFOL N/A 11/24/2016   Procedure: COLONOSCOPY WITH PROPOFOL;  Surgeon: Daneil Dolin, MD;  [facility];  Service: Endoscopy;   Laterality: N/A;  200  . CORONARY ANGIOPLASTY WITH STENT PLACEMENT  01/2015  . ESOPHAGOGASTRODUODENOSCOPY (EGD) WITH PROPOFOL N/A 10/09/2016   Procedure: ESOPHAGOGASTRODUODENOSCOPY (EGD) WITH PROPOFOL;  Surgeon: Daneil Dolin, MD;  [facility];  Service: Endoscopy;  Laterality: N/A;  . INCISION / DRAINAGE HAND / FINGER    . POLYPECTOMY  11/24/2016   Procedure: POLYPECTOMY;  Surgeon: Daneil Dolin, MD;  [facility];  Service: Endoscopy;;  descending and sigmoid  . TOTAL HIP ARTHROPLASTY Right 07/01/2016  . TOTAL HIP ARTHROPLASTY Right 07/01/2016   Procedure: RIGHT TOTAL HIP ARTHROPLASTY ANTERIOR APPROACH;  Surgeon: Mcarthur Rossetti, MD;  Location: [facility];  Service: Orthopedics;  Laterality: Right;     Home Medications    Prior to Admission medications   Medication Sig Start Date End Date Taking? Authorizing Provider  albuterol (PROVENTIL HFA;VENTOLIN HFA) 108 (90 Base) MCG/ACT inhaler Inhale 2 puffs into the lungs every 6 (six) hours as needed for wheezing or shortness of breath. 04/24/16   Soyla Dryer, PA-C  albuterol (PROVENTIL) (2.5 MG/3ML) 0.083% nebulizer solution Take 3 mLs (2.5 mg total) by nebulization every 6 (six) hours as needed for wheezing or shortness of breath. 05/27/16   Soyla Dryer, PA-C  amLODipine (NORVASC) 10 MG tablet Take 1 tablet (10 mg total) by mouth daily. 10/06/16   Soyla Dryer, PA-C  aspirin EC 81 MG tablet Take 1 tablet (81 mg total) by mouth 2 (two) times daily after a meal. Patient taking differently: Take 81 mg by mouth daily.  07/03/16   Mcarthur Rossetti, MD  atorvastatin (LIPITOR) 20 MG tablet Take 1 tablet (20 mg total) by mouth daily. Patient taking differently: Take 20 mg by mouth daily at 6 PM.  09/10/16   Soyla Dryer, PA-C  Carboxymeth-Glycerin-Polysorb (REFRESH OPTIVE ADVANCED) 0.5-1-0.5 % SOLN Apply 1 drop to eye 3 (three) times daily as needed (dry eyes).     Historical Provider, MD  citalopram (CELEXA) 10  MG tablet Take 10 mg by mouth daily.    Historical Provider, MD  cycloSPORINE (RESTASIS) 0.05 % ophthalmic emulsion Place 1 drop into both eyes 2 (two) times daily.    Historical Provider, MD  isosorbide mononitrate (IMDUR) 30 MG 24 hr tablet Take 0.5 tablets (15 mg total) by mouth daily. 10/06/16   Arnoldo Lenis, MD  Menthol, Topical Analgesic, (BENGAY EX) Apply 1 application topically 2 (two) times daily as needed (for muscle aches/pain.).     Historical Provider, MD  metFORMIN (GLUCOPHAGE) 1000 MG tablet Take 1 tablet (1,000 mg total) by mouth 2 (two) times daily with a meal. 10/06/16   Soyla Dryer, PA-C  mometasone-formoterol (DULERA) 100-5 MCG/ACT AERO Inhale 2 puffs into the lungs 2 (two) times daily. 04/24/16   Soyla Dryer, PA-C  nicotine (NICODERM CQ) 7 mg/24hr patch Place 1 patch (7 mg total) onto the skin daily. Patient taking differently: Place 7 mg onto the skin once a week.  08/11/16   Soyla Dryer, PA-C  nitroGLYCERIN (NITROSTAT) 0.4 MG SL tablet Place 1 tablet (0.4 mg  total) under the tongue every 5 (five) minutes as needed for chest pain. 02/09/15   Almyra Deforest, PA  Olopatadine HCl (PAZEO) 0.7 % SOLN Place 1 drop into both eyes daily.     Historical Provider, MD  Omega-3 Fatty Acids (FISH OIL) 1200 MG CAPS Take 1,200 mg by mouth 2 (two) times daily.    Historical Provider, MD  oxymetazoline (AFRIN) 0.05 % nasal spray Place 1 spray into both nostrils 2 (two) times daily as needed for congestion.    Historical Provider, MD  pantoprazole (PROTONIX) 40 MG tablet Take 1 tablet (40 mg total) by mouth daily. 10/13/16   Daneil Dolin, MD  polyethylene glycol-electrolytes (TRILYTE) 420 g solution Take 4,000 mLs by mouth as directed. 10/28/16   Daneil Dolin, MD  sildenafil (VIAGRA) 100 MG tablet Take 0.5 tablets (50 mg total) by mouth daily as needed for erectile dysfunction. 12/11/16   Arnoldo Lenis, MD  traZODone (DESYREL) 100 MG tablet Take 100 mg by mouth at bedtime.     Historical  Provider, MD    Family History Family History  Problem Relation Age of Onset  . Heart attack Father   . Stroke Father   . Cancer Mother     ???    Social History Social History  Substance Use Topics  . Smoking status: Current Some Day Smoker    Packs/day: 0.50    Years: 46.00    Types: Cigarettes    Start date: 06/07/1979  . Smokeless tobacco: Former Systems developer    Types: Chew     Comment: plans to get patches  . 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 now drinks on occasion.     Allergies   Carvedilol; Lisinopril; and Amoxicillin   Review of Systems Review of Systems  Constitutional: Negative for fever.  Gastrointestinal: Negative for nausea and vomiting.  Musculoskeletal: Positive for back pain.  Neurological: Negative for numbness.   Physical Exam Updated Vital Signs BP 131/88 (BP Location: Right Arm)   Pulse 99   Temp 97.8 F (36.6 C) (Oral)   Resp 18   Ht '5\' 5"'$  (1.651 m)   Wt 103.4 kg   SpO2 96%   BMI 37.94 kg/m   Physical Exam  Constitutional: He is oriented to person, place, and time. Vital signs are normal. He appears well-developed and well-nourished.  HENT:  Head: Normocephalic and atraumatic.  Right Ear: Hearing normal.  Left Ear: Hearing normal.  Eyes: Conjunctivae and EOM are normal. Pupils are equal, round, and reactive to light.  Neck: Normal range of motion. Neck supple.  Cardiovascular: Normal rate, regular rhythm, normal heart sounds and intact distal pulses.   Pulmonary/Chest: Effort normal and breath sounds normal.  Musculoskeletal: Normal range of motion.  Left lower lumbar musculature TTP. No midline spinous process tenderness. No palpable or visible deformities. Ambulates without gait abnormality   Neurological: He is alert and oriented to person, place, and time.  Skin: Skin is warm and dry.  Psychiatric: He has a normal mood and affect. His speech is  normal and behavior is normal. Thought content normal.  Nursing note and vitals reviewed.  ED Treatments / Results  Labs (all labs ordered are listed, but only abnormal results are displayed) Labs Reviewed - No data to display  EKG  EKG Interpretation None       Radiology  No results found.  Procedures Procedures (including critical care time)  Medications Ordered in ED Medications - No data to display   Initial Impression / Assessment and Plan / ED Course  I have reviewed the triage vital signs and the nursing notes.  Pertinent labs & imaging results that were available during my care of the patient were reviewed by me and considered in my medical decision making (see chart for details).  Final Clinical Impressions(s) / ED Diagnoses     {I have reviewed the relevant previous healthcare records.  {I obtained HPI from historian.   ED Course:  Assessment: .Patient is a 51 y.o. male who presents to the ED with back pain s/p bending over and reaching for water bottle. Notes isolated p[ain to left side. Likely lumbar strain. No neurological deficits appreciated. Patient is ambulatory. No warning symptoms of back pain including: fecal incontinence, urinary retention or overflow incontinence, night sweats, waking from sleep with back pain, unexplained fevers or weight loss, h/o cancer, IVDU, recent trauma. No concern for cauda equina, epidural abscess, or other serious cause of back pain. Conservative measures such as rest, ice/heat and pain medicine indicated with PCP follow-up if no improvement with conservative management.    Disposition/Plan:  DC Home Additional Verbal discharge instructions given and discussed with patient.  Pt Instructed to f/u with PCP in the next week for evaluation and treatment of symptoms. Return precautions given Pt acknowledges and agrees with plan  Supervising Physician Merrily Pew, MD  Final diagnoses:  Acute left-sided low back pain without  sciatica  Strain of lumbar region, initial encounter    New Prescriptions New Prescriptions   No medications on file     Shary Decamp, PA-C 12/22/16 4801    Merrily Pew, MD 12/22/16 1408

## 2017-01-02 ENCOUNTER — Other Ambulatory Visit: Payer: Self-pay | Admitting: Cardiology

## 2017-02-11 ENCOUNTER — Ambulatory Visit: Payer: Medicaid Other | Admitting: Physician Assistant

## 2017-02-11 ENCOUNTER — Encounter: Payer: Self-pay | Admitting: Physician Assistant

## 2017-02-11 VITALS — BP 120/60 | HR 95 | Temp 97.9°F | Ht 65.0 in | Wt 239.2 lb

## 2017-02-11 DIAGNOSIS — I10 Essential (primary) hypertension: Secondary | ICD-10-CM

## 2017-02-11 DIAGNOSIS — E119 Type 2 diabetes mellitus without complications: Secondary | ICD-10-CM

## 2017-02-11 DIAGNOSIS — I2510 Atherosclerotic heart disease of native coronary artery without angina pectoris: Secondary | ICD-10-CM

## 2017-02-11 DIAGNOSIS — E785 Hyperlipidemia, unspecified: Secondary | ICD-10-CM

## 2017-02-11 DIAGNOSIS — J449 Chronic obstructive pulmonary disease, unspecified: Secondary | ICD-10-CM

## 2017-02-11 DIAGNOSIS — F17219 Nicotine dependence, cigarettes, with unspecified nicotine-induced disorders: Secondary | ICD-10-CM

## 2017-02-11 DIAGNOSIS — E669 Obesity, unspecified: Secondary | ICD-10-CM

## 2017-02-11 MED ORDER — MOMETASONE FURO-FORMOTEROL FUM 100-5 MCG/ACT IN AERO: 2.0000 | INHALATION_SPRAY | Freq: Two times a day (BID) | RESPIRATORY_TRACT | 4 refills | Status: DC

## 2017-02-11 NOTE — Progress Notes (Signed)
BP 120/60 (BP Location: Left Arm, Patient Position: Sitting, Cuff Size: Normal)   Pulse 95   Temp 97.9 F (36.6 C)   Ht 5\' 5"  (1.651 m)   Wt 239 lb 4 oz (108.5 kg)   SpO2 91%   BMI 39.81 kg/m    Subjective:    Patient ID: Randy Hebert, male    DOB: [birth year], 51 y.o.   MRN: [id number]  HPI: Randy Hebert is a 51 y.o. male presenting on 02/11/2017 for Diabetes; COPD; Hypertension; and Hyperlipidemia   HPI   Pt is still smoking.  He says his breathing is somewhat okay.  Pt had colonoscopy done. He says they found polyps.   Pt saw cardiologist in April. Pt is going to [facility] mood  Pt is out of dulera.  He says he didn't get refill from pharmacy.   He uses afrin maybe twice/week.   He as question about using his viagra and imdur.  He has been trying to not take the imdur for a day or two when he is planning to use the viagra.  I told the pt that he is not supposed to be using those two meds together as he discussed with dr [name].     Relevant past medical, surgical, family and social history reviewed and updated as indicated. Interim medical history since our last visit reviewed. Allergies and medications reviewed and updated.   Current Outpatient Prescriptions:  .  albuterol (PROVENTIL HFA;VENTOLIN HFA) 108 (90 Base) MCG/ACT inhaler, Inhale 2 puffs into the lungs every 6 (six) hours as needed for wheezing or shortness of breath., Disp: 1 Inhaler, Rfl: 4 .  albuterol (PROVENTIL) (2.5 MG/3ML) 0.083% nebulizer solution, Take 3 mLs (2.5 mg total) by nebulization every 6 (six) hours as needed for wheezing or shortness of breath., Disp: 150 mL, Rfl: 1 .  amLODipine (NORVASC) 10 MG tablet, Take 1 tablet (10 mg total) by mouth daily., Disp: 30 tablet, Rfl: 4 .  aspirin EC 81 MG tablet, Take 1 tablet (81 mg total) by mouth 2 (two) times daily after a meal. (Patient taking differently: Take 81 mg by mouth daily. ), Disp: 60 tablet, Rfl: 0 .  atorvastatin (LIPITOR) 20 MG  tablet, Take 1 tablet (20 mg total) by mouth daily. (Patient taking differently: Take 20 mg by mouth daily at 6 PM. ), Disp: 30 tablet, Rfl: 4 .  Carboxymeth-Glycerin-Polysorb (REFRESH OPTIVE ADVANCED) 0.5-1-0.5 % SOLN, Apply 1 drop to eye 2 (two) times daily as needed (dry eyes). , Disp: , Rfl:  .  citalopram (CELEXA) 10 MG tablet, Take 10 mg by mouth daily., Disp: , Rfl:  .  cycloSPORINE (RESTASIS) 0.05 % ophthalmic emulsion, Place 1 drop into both eyes 2 (two) times daily., Disp: , Rfl:  .  ibuprofen (ADVIL,MOTRIN) 600 MG tablet, Take 1 tablet (600 mg total) by mouth every 6 (six) hours as needed., Disp: 30 tablet, Rfl: 0 .  isosorbide mononitrate (IMDUR) 30 MG 24 hr tablet, Take 0.5 tablets (15 mg total) by mouth daily., Disp: 45 tablet, Rfl: 3 .  Menthol, Topical Analgesic, (BENGAY EX), Apply 1 application topically 2 (two) times daily as needed (for muscle aches/pain.). , Disp: , Rfl:  .  metFORMIN (GLUCOPHAGE) 1000 MG tablet, Take 1 tablet (1,000 mg total) by mouth 2 (two) times daily with a meal., Disp: 60 tablet, Rfl: 4 .  methocarbamol (ROBAXIN) 500 MG tablet, Take 1 tablet (500 mg total) by mouth 2 (two) times daily., Disp: 20 tablet,  Rfl: 0 .  nicotine (NICODERM CQ) 7 mg/24hr patch, Place 1 patch (7 mg total) onto the skin daily. (Patient taking differently: Place 7 mg onto the skin once a week. ), Disp: 28 patch, Rfl: 1 .  nitroGLYCERIN (NITROSTAT) 0.4 MG SL tablet, Place 1 tablet (0.4 mg total) under the tongue every 5 (five) minutes as needed for chest pain., Disp: 25 tablet, Rfl: 3 .  Olopatadine HCl (PAZEO) 0.7 % SOLN, Place 1 drop into both eyes daily. , Disp: , Rfl:  .  Omega-3 Fatty Acids (FISH OIL) 1200 MG CAPS, Take 1,200 mg by mouth 2 (two) times daily., Disp: , Rfl:  .  oxymetazoline (AFRIN) 0.05 % nasal spray, Place 1 spray into both nostrils 2 (two) times daily as needed for congestion., Disp: , Rfl:  .  pantoprazole (PROTONIX) 40 MG tablet, Take 1 tablet (40 mg total) by  mouth daily., Disp: 30 tablet, Rfl: 11 .  sildenafil (VIAGRA) 100 MG tablet, Take 0.5 tablets (50 mg total) by mouth daily as needed for erectile dysfunction., Disp: 4 tablet, Rfl: 11 .  traZODone (DESYREL) 100 MG tablet, Take 100 mg by mouth at bedtime. , Disp: , Rfl:  .  mometasone-formoterol (DULERA) 100-5 MCG/ACT AERO, Inhale 2 puffs into the lungs 2 (two) times daily., Disp: 1 Inhaler, Rfl: 4   Review of Systems  Constitutional: Positive for appetite change and diaphoresis. Negative for chills, fatigue, fever and unexpected weight change.  HENT: Positive for congestion and sneezing. Negative for dental problem, drooling, ear pain, facial swelling, hearing loss, mouth sores, sore throat, trouble swallowing and voice change.   Eyes: Positive for discharge and visual disturbance. Negative for pain, redness and itching.  Respiratory: Positive for cough, shortness of breath and wheezing. Negative for choking.   Cardiovascular: Positive for leg swelling. Negative for chest pain and palpitations.  Gastrointestinal: Negative for abdominal pain, blood in stool, constipation, diarrhea and vomiting.  Endocrine: Positive for heat intolerance. Negative for cold intolerance and polydipsia.  Genitourinary: Negative for decreased urine volume, dysuria and hematuria.  Musculoskeletal: Positive for arthralgias and back pain. Negative for gait problem.  Skin: Negative for rash.  Allergic/Immunologic: Positive for environmental allergies.  Neurological: Positive for headaches. Negative for seizures, syncope and light-headedness.  Hematological: Negative for adenopathy.  Psychiatric/Behavioral: Negative for agitation, dysphoric mood and suicidal ideas. The patient is nervous/anxious.     Per HPI unless specifically indicated above     Objective:    BP 120/60 (BP Location: Left Arm, Patient Position: Sitting, Cuff Size: Normal)   Pulse 95   Temp 97.9 F (36.6 C)   Ht 5\' 5"  (1.651 m)   Wt 239 lb 4 oz  (108.5 kg)   SpO2 91%   BMI 39.81 kg/m   Wt Readings from Last 3 Encounters:  02/11/17 239 lb 4 oz (108.5 kg)  12/22/16 228 lb (103.4 kg)  12/11/16 228 lb (103.4 kg)    Physical Exam  Constitutional: He is oriented to person, place, and time. He appears well-developed and well-nourished.  HENT:  Head: Normocephalic and atraumatic.  Neck: Neck supple.  Cardiovascular: Normal rate and regular rhythm.   Pulmonary/Chest: Effort normal and breath sounds normal. He has no wheezes.  Abdominal: Soft. Bowel sounds are normal. There is no hepatosplenomegaly. There is no tenderness.  Musculoskeletal: He exhibits no edema.  Lymphadenopathy:    He has no cervical adenopathy.  Neurological: He is alert and oriented to person, place, and time.  Skin: Skin is warm and  dry.  Psychiatric: He has a normal mood and affect. His behavior is normal.  Vitals reviewed.       Assessment & Plan:    Encounter Diagnoses  Name Primary?  . Type 2 diabetes mellitus without complication, without long-term current use of insulin ([place]) Yes  . Essential hypertension   . Chronic obstructive pulmonary disease, unspecified COPD type ([place])   . Hyperlipidemia, unspecified hyperlipidemia type   . Cigarette nicotine dependence with nicotine-induced disorder   . Obesity, unspecified classification, unspecified obesity type, unspecified whether serious comorbidity present   . Coronary artery disease involving native coronary artery of native heart without angina pectoris     -pt to continue current meds.  Reminded no viagra while taking the imdur -Counseled pt to avoid afrin.  recommended flonase or similar.  -dulera refilled. Counseled smoking cessation -counseled pt about need for weight management to improve his health conditions.  Discussed diet and exercise -discussed with pt that he will need to transfer care to [facility] provider.  We will continue to fill meds until he can get appointment.  Recommended dr  Meda Coffee down the road.  He can call us if he has trouble contacting

## 2017-02-17 ENCOUNTER — Encounter (HOSPITAL_COMMUNITY): Payer: Self-pay | Admitting: Emergency Medicine

## 2017-02-17 ENCOUNTER — Emergency Department (HOSPITAL_COMMUNITY): Payer: Medicaid Other

## 2017-02-17 ENCOUNTER — Observation Stay (HOSPITAL_BASED_OUTPATIENT_CLINIC_OR_DEPARTMENT_OTHER): Payer: Medicaid Other

## 2017-02-17 ENCOUNTER — Observation Stay (HOSPITAL_COMMUNITY)
Admission: EM | Admit: 2017-02-17 | Discharge: 2017-02-18 | Disposition: A | Discharge status: Home / Self Care | Payer: Medicaid Other | Attending: Internal Medicine | Admitting: Internal Medicine

## 2017-02-17 DIAGNOSIS — Z7984 Long term (current) use of oral hypoglycemic drugs: Secondary | ICD-10-CM | POA: Diagnosis not present

## 2017-02-17 DIAGNOSIS — I370 Nonrheumatic pulmonary valve stenosis: Secondary | ICD-10-CM

## 2017-02-17 DIAGNOSIS — R7989 Other specified abnormal findings of blood chemistry: Secondary | ICD-10-CM

## 2017-02-17 DIAGNOSIS — R0902 Hypoxemia: Secondary | ICD-10-CM

## 2017-02-17 DIAGNOSIS — E119 Type 2 diabetes mellitus without complications: Secondary | ICD-10-CM | POA: Insufficient documentation

## 2017-02-17 DIAGNOSIS — I360 Nonrheumatic tricuspid (valve) stenosis: Secondary | ICD-10-CM

## 2017-02-17 DIAGNOSIS — Z79899 Other long term (current) drug therapy: Secondary | ICD-10-CM | POA: Insufficient documentation

## 2017-02-17 DIAGNOSIS — Z7982 Long term (current) use of aspirin: Secondary | ICD-10-CM | POA: Diagnosis not present

## 2017-02-17 DIAGNOSIS — R079 Chest pain, unspecified: Secondary | ICD-10-CM

## 2017-02-17 DIAGNOSIS — F17219 Nicotine dependence, cigarettes, with unspecified nicotine-induced disorders: Secondary | ICD-10-CM

## 2017-02-17 DIAGNOSIS — I10 Essential (primary) hypertension: Secondary | ICD-10-CM | POA: Diagnosis not present

## 2017-02-17 DIAGNOSIS — J441 Chronic obstructive pulmonary disease with (acute) exacerbation: Secondary | ICD-10-CM | POA: Diagnosis not present

## 2017-02-17 DIAGNOSIS — I5022 Chronic systolic (congestive) heart failure: Secondary | ICD-10-CM | POA: Diagnosis not present

## 2017-02-17 DIAGNOSIS — R778 Other specified abnormalities of plasma proteins: Secondary | ICD-10-CM

## 2017-02-17 DIAGNOSIS — R0602 Shortness of breath: Secondary | ICD-10-CM | POA: Diagnosis present

## 2017-02-17 DIAGNOSIS — F17210 Nicotine dependence, cigarettes, uncomplicated: Secondary | ICD-10-CM | POA: Insufficient documentation

## 2017-02-17 DIAGNOSIS — J9601 Acute respiratory failure with hypoxia: Secondary | ICD-10-CM | POA: Diagnosis not present

## 2017-02-17 DIAGNOSIS — I110 Hypertensive heart disease with heart failure: Secondary | ICD-10-CM | POA: Diagnosis not present

## 2017-02-17 DIAGNOSIS — I2510 Atherosclerotic heart disease of native coronary artery without angina pectoris: Secondary | ICD-10-CM | POA: Diagnosis not present

## 2017-02-17 LAB — ECHOCARDIOGRAM COMPLETE
CHL CUP MV DEC (S): 222
CHL CUP STROKE VOLUME: 61 mL
E decel time: 222 msec
EERAT: 9.33
FS: 33 % (ref 28–44)
HEIGHTINCHES: 65 in
IVS/LV PW RATIO, ED: 1.07
LA diam index: 1.44 cm/m2
LA vol index: 22.5 mL/m2
LA vol: 51.4 mL
LASIZE: 33 mm
LAVOLA4C: 52.6 mL
LDCA: 4.15 cm2
LEFT ATRIUM END SYS DIAM: 33 mm
LV E/e'average: 9.33
LV PW d: 11.2 mm — AB (ref 0.6–1.1)
LV dias vol index: 43 mL/m2
LV dias vol: 98 mL (ref 62–150)
LV sys vol index: 16 mL/m2
LVEEMED: 9.33
LVELAT: 8.16 cm/s
LVOT VTI: 21.7 cm
LVOT peak grad rest: 5 mmHg
LVOTD: 23 mm
LVOTPV: 109 cm/s
LVOTSV: 90 mL
LVSYSVOL: 37 mL
MV pk A vel: 66.5 m/s
MV pk E vel: 76.1 m/s
MVPG: 2 mmHg
RV LATERAL S' VELOCITY: 10.9 cm/s
RV TAPSE: 18.9 mm
Simpson's disk: 62
TDI e' lateral: 8.16
TDI e' medial: 7.29
WEIGHTICAEL: 3840 [oz_av]

## 2017-02-17 LAB — BASIC METABOLIC PANEL
ANION GAP: 8 (ref 5–15)
BUN: 7 mg/dL (ref 6–20)
CHLORIDE: 96 mmol/L — AB (ref 101–111)
CO2: 32 mmol/L (ref 22–32)
CREATININE: 0.75 mg/dL (ref 0.61–1.24)
Calcium: 8.7 mg/dL — ABNORMAL LOW (ref 8.9–10.3)
GFR calc non Af Amer: >60 mL/min (ref >60)
Glucose, Bld: 256 mg/dL — ABNORMAL HIGH (ref 65–99)
POTASSIUM: 3.6 mmol/L (ref 3.5–5.1)
Sodium: 136 mmol/L (ref 135–145)

## 2017-02-17 LAB — TROPONIN I
TROPONIN I: 0.33 ng/mL — AB (ref <0.03)
Troponin I: 0.17 ng/mL (ref <0.03)
Troponin I: 0.27 ng/mL (ref <0.03)
Troponin I: 0.2 ng/mL (ref <0.03)

## 2017-02-17 LAB — CBC
HEMATOCRIT: 44.6 % (ref 39.0–52.0)
HEMOGLOBIN: 13.7 g/dL (ref 13.0–17.0)
MCH: 25.8 pg — ABNORMAL LOW (ref 26.0–34.0)
MCHC: 30.7 g/dL (ref 30.0–36.0)
MCV: 84 fL (ref 78.0–100.0)
Platelets: 233 10*3/uL (ref 150–400)
RBC: 5.31 MIL/uL (ref 4.22–5.81)
RDW: 15.4 % (ref 11.5–15.5)
WBC: 8.8 10*3/uL (ref 4.0–10.5)

## 2017-02-17 LAB — D-DIMER, QUANTITATIVE (NOT AT ARMC): D DIMER QUANT: 0.43 ug{FEU}/mL (ref 0.00–0.50)

## 2017-02-17 LAB — GLUCOSE, CAPILLARY
Glucose-Capillary: 144 mg/dL — ABNORMAL HIGH (ref 65–99)
Glucose-Capillary: 241 mg/dL — ABNORMAL HIGH (ref 65–99)

## 2017-02-17 MED ORDER — ISOSORBIDE MONONITRATE ER 30 MG PO TB24
15.0000 mg | ORAL_TABLET | Freq: Every day | ORAL | Status: DC
  Administered 2017-02-17 – 2017-02-18 (×2): 15 mg via ORAL
  Filled 2017-02-17 (×2): qty 1

## 2017-02-17 MED ORDER — CYCLOSPORINE 0.05 % OP EMUL
1.0000 [drp] | Freq: Two times a day (BID) | OPHTHALMIC | Status: DC
  Administered 2017-02-17 – 2017-02-18 (×2): 1 [drp] via OPHTHALMIC
  Filled 2017-02-17 (×2): qty 1

## 2017-02-17 MED ORDER — IPRATROPIUM BROMIDE 0.02 % IN SOLN
0.5000 mg | Freq: Once | RESPIRATORY_TRACT | Status: AC
  Administered 2017-02-17: 0.5 mg via RESPIRATORY_TRACT
  Filled 2017-02-17: qty 2.5

## 2017-02-17 MED ORDER — CITALOPRAM HYDROBROMIDE 20 MG PO TABS
10.0000 mg | ORAL_TABLET | Freq: Every day | ORAL | Status: DC
  Administered 2017-02-17 – 2017-02-18 (×2): 10 mg via ORAL
  Filled 2017-02-17 (×2): qty 1

## 2017-02-17 MED ORDER — METHYLPREDNISOLONE SODIUM SUCC 125 MG IJ SOLR
60.0000 mg | Freq: Four times a day (QID) | INTRAMUSCULAR | Status: DC
  Administered 2017-02-17 – 2017-02-18 (×3): 60 mg via INTRAVENOUS
  Filled 2017-02-17 (×3): qty 2

## 2017-02-17 MED ORDER — ACETAMINOPHEN 325 MG PO TABS
650.0000 mg | ORAL_TABLET | ORAL | Status: DC | PRN
Start: 2017-02-17 — End: 2017-02-18

## 2017-02-17 MED ORDER — GUAIFENESIN ER 600 MG PO TB12
1200.0000 mg | ORAL_TABLET | Freq: Two times a day (BID) | ORAL | Status: DC
  Administered 2017-02-17 – 2017-02-18 (×3): 1200 mg via ORAL
  Filled 2017-02-17 (×3): qty 2

## 2017-02-17 MED ORDER — MOMETASONE FURO-FORMOTEROL FUM 100-5 MCG/ACT IN AERO
2.0000 | INHALATION_SPRAY | Freq: Two times a day (BID) | RESPIRATORY_TRACT | Status: DC
  Administered 2017-02-18: 2 via RESPIRATORY_TRACT
  Filled 2017-02-17: qty 8.8

## 2017-02-17 MED ORDER — METHYLPREDNISOLONE SODIUM SUCC 125 MG IJ SOLR
125.0000 mg | INTRAMUSCULAR | Status: AC
  Administered 2017-02-17: 125 mg via INTRAVENOUS
  Filled 2017-02-17: qty 2

## 2017-02-17 MED ORDER — OLOPATADINE HCL 0.1 % OP SOLN
1.0000 [drp] | Freq: Every day | OPHTHALMIC | Status: DC
  Filled 2017-02-17: qty 5

## 2017-02-17 MED ORDER — NICOTINE 7 MG/24HR TD PT24
7.0000 mg | MEDICATED_PATCH | Freq: Every day | TRANSDERMAL | Status: DC
  Administered 2017-02-17 – 2017-02-18 (×2): 7 mg via TRANSDERMAL
  Filled 2017-02-17 (×2): qty 1

## 2017-02-17 MED ORDER — IPRATROPIUM-ALBUTEROL 0.5-2.5 (3) MG/3ML IN SOLN
3.0000 mL | Freq: Four times a day (QID) | RESPIRATORY_TRACT | Status: DC
  Administered 2017-02-17: 3 mL via RESPIRATORY_TRACT
  Filled 2017-02-17: qty 3

## 2017-02-17 MED ORDER — AMLODIPINE BESYLATE 5 MG PO TABS
10.0000 mg | ORAL_TABLET | Freq: Every day | ORAL | Status: DC
  Administered 2017-02-17 – 2017-02-18 (×2): 10 mg via ORAL
  Filled 2017-02-17 (×2): qty 2

## 2017-02-17 MED ORDER — INSULIN ASPART 100 UNIT/ML ~~LOC~~ SOLN
0.0000 [IU] | Freq: Every day | SUBCUTANEOUS | Status: DC
  Administered 2017-02-17: 2 [IU] via SUBCUTANEOUS

## 2017-02-17 MED ORDER — TRAZODONE HCL 50 MG PO TABS
100.0000 mg | ORAL_TABLET | Freq: Every day | ORAL | Status: DC
  Administered 2017-02-17: 100 mg via ORAL
  Filled 2017-02-17: qty 2

## 2017-02-17 MED ORDER — INSULIN ASPART 100 UNIT/ML ~~LOC~~ SOLN
0.0000 [IU] | Freq: Three times a day (TID) | SUBCUTANEOUS | Status: DC
  Administered 2017-02-17: 2 [IU] via SUBCUTANEOUS
  Administered 2017-02-18: 5 [IU] via SUBCUTANEOUS

## 2017-02-17 MED ORDER — MORPHINE SULFATE (PF) 2 MG/ML IV SOLN
2.0000 mg | INTRAVENOUS | Status: DC | PRN
  Administered 2017-02-17 – 2017-02-18 (×5): 2 mg via INTRAVENOUS
  Filled 2017-02-17 (×5): qty 1

## 2017-02-17 MED ORDER — POLYVINYL ALCOHOL 1.4 % OP SOLN: 1.0000 [drp] | Freq: Two times a day (BID) | OPHTHALMIC | Status: DC | PRN

## 2017-02-17 MED ORDER — IPRATROPIUM-ALBUTEROL 0.5-2.5 (3) MG/3ML IN SOLN
3.0000 mL | Freq: Two times a day (BID) | RESPIRATORY_TRACT | Status: DC
  Administered 2017-02-18: 3 mL via RESPIRATORY_TRACT
  Filled 2017-02-17: qty 3

## 2017-02-17 MED ORDER — ENOXAPARIN SODIUM 40 MG/0.4ML ~~LOC~~ SOLN
40.0000 mg | SUBCUTANEOUS | Status: DC
  Administered 2017-02-17: 40 mg via SUBCUTANEOUS
  Filled 2017-02-17: qty 0.4

## 2017-02-17 MED ORDER — PANTOPRAZOLE SODIUM 40 MG PO TBEC
40.0000 mg | DELAYED_RELEASE_TABLET | Freq: Every day | ORAL | Status: DC
  Administered 2017-02-17 – 2017-02-18 (×2): 40 mg via ORAL
  Filled 2017-02-17 (×2): qty 1

## 2017-02-17 MED ORDER — MOMETASONE FURO-FORMOTEROL FUM 100-5 MCG/ACT IN AERO
2.0000 | INHALATION_SPRAY | Freq: Two times a day (BID) | RESPIRATORY_TRACT | Status: DC
  Administered 2017-02-17: 2 via RESPIRATORY_TRACT
  Filled 2017-02-17: qty 8.8

## 2017-02-17 MED ORDER — METHOCARBAMOL 500 MG PO TABS
500.0000 mg | ORAL_TABLET | Freq: Four times a day (QID) | ORAL | Status: DC | PRN
  Administered 2017-02-17 – 2017-02-18 (×3): 500 mg via ORAL
  Filled 2017-02-17 (×3): qty 1

## 2017-02-17 MED ORDER — ALBUTEROL SULFATE (2.5 MG/3ML) 0.083% IN NEBU
5.0000 mg | INHALATION_SOLUTION | Freq: Once | RESPIRATORY_TRACT | Status: AC
Start: 2017-02-17 — End: 2017-02-17
  Administered 2017-02-17: 5 mg via RESPIRATORY_TRACT
  Filled 2017-02-17: qty 6

## 2017-02-17 MED ORDER — LEVOFLOXACIN 500 MG PO TABS
500.0000 mg | ORAL_TABLET | Freq: Every day | ORAL | Status: DC
  Administered 2017-02-17 – 2017-02-18 (×2): 500 mg via ORAL
  Filled 2017-02-17 (×2): qty 1

## 2017-02-17 MED ORDER — ONDANSETRON HCL 4 MG/2ML IJ SOLN: 4.0000 mg | Freq: Four times a day (QID) | INTRAMUSCULAR | Status: DC | PRN

## 2017-02-17 MED ORDER — ATORVASTATIN CALCIUM 20 MG PO TABS
20.0000 mg | ORAL_TABLET | Freq: Every day | ORAL | Status: DC
  Administered 2017-02-17: 20 mg via ORAL
  Filled 2017-02-17: qty 1

## 2017-02-17 MED ORDER — ALBUTEROL SULFATE (2.5 MG/3ML) 0.083% IN NEBU
2.5000 mg | INHALATION_SOLUTION | RESPIRATORY_TRACT | Status: DC | PRN
  Administered 2017-02-17: 2.5 mg via RESPIRATORY_TRACT
  Filled 2017-02-17: qty 3

## 2017-02-17 MED ORDER — NITROGLYCERIN 0.4 MG SL SUBL: 0.4000 mg | SUBLINGUAL_TABLET | SUBLINGUAL | Status: DC | PRN

## 2017-02-17 MED ORDER — ASPIRIN EC 325 MG PO TBEC
325.0000 mg | DELAYED_RELEASE_TABLET | Freq: Every day | ORAL | Status: DC
  Administered 2017-02-17 – 2017-02-18 (×2): 325 mg via ORAL
  Filled 2017-02-17 (×2): qty 1

## 2017-02-17 NOTE — ED Triage Notes (Addendum)
Pt reports increased shortness of breath since Friday. Pt reports lifted a bench on Saturday and reports chest pain ever since. Pt reports lifting bench also flared up chronic back pain. Pt reports used nebulizer and inhaler at home with no relief. Pt reports chest pain is worse with movement and deep breath. Pt able to speak clear complete sentences.

## 2017-02-17 NOTE — Progress Notes (Signed)
*  PRELIMINARY RESULTS* Echocardiogram 2D Echocardiogram has been performed.  Randy Hebert 02/17/2017, 4:08 PM

## 2017-02-17 NOTE — ED Notes (Signed)
CRITICAL VALUE ALERT  Critical Value:  Troponin 0.17  Date & Time Notied:  02/17/2017, 1114  Provider Notified: Dr. Alvino Chapel  Orders Received/Actions [phone]

## 2017-02-17 NOTE — H&P (Signed)
History and Physical    KEYLAN COSTABILE UXL:[id number] DOB: 1965/11/10 DOA: 02/17/2017  PCP: Soyla Dryer, PA-C  Patient coming from: home  I have personally briefly reviewed patient's old medical records in [place]  Chief Complaint: Chest pain and shortness of breath  HPI: CASHTYN POULIOT is a 51 y.o. male with medical history significant of hypertension, diabetes, coronary artery disease, COPD with ongoing tobacco use. Patient reports that for the past 2 days he's had worsening chest pain and shortness of breath. He reports that on Saturday he was out working in the yard and had done some heavy lifting. The following day, he began having tightness in his lower back as well as some shortness of breath. This is associated with heaviness in his chest which was nonradiating. He had some nausea. He noticed that when he stood up too quickly he will get dizzy. Shortness of breath and chest discomfort are not necessarily worse with exertion. He had been using his albuterol without significant benefit. He did not have any fever. He did have a nonproductive cough and has been wheezing.   ED Course: He was evaluated in the emergency room and noted to be hypoxic on room air with oxygen saturations in the high 80s. Chest x-ray did not show any pneumonia or pulmonary edema. EKG did not show any acute changes and troponin was mildly elevated at 0.17. He's been referred for admission.  Review of Systems: As per HPI otherwise 10 point review of systems negative.    Past Medical History:  Diagnosis Date  . Anxiety   . Arthritis   . Back pain   . Bulging of cervical intervertebral disc   . CAD (coronary artery disease)    lateral STEMI 02/06/2015 00% D1 occlusion treated with Promus Premier 2.5 mm x 16 mm DES, 70% ramus stenosis, 40% mid RCA stenosis, 45% distal RCA stenosis, EF 45-50%  . CHF (congestive heart failure) ([place])   . COPD (chronic obstructive pulmonary disease) ([place])   .  Depression   . Diabetes mellitus without complication ([place])   . 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 Dry eye   . Dyspnea   . GERD (gastroesophageal reflux disease)   . Headache   . Hip pain   . MI (myocardial infarction) ([place])   . Neck pain   . Otitis media   . Pleurisy   . Sinus pause    9 sec sinus pause on telemetry after started on coreg after MI, avoid AV nodal blocking agent    Past Surgical History:  Procedure Laterality Date  . BIOPSY  10/09/2016   Procedure: BIOPSY;  Surgeon: Daneil Dolin, MD;  [facility];  Service: Endoscopy;;  . CARDIAC CATHETERIZATION N/A 02/06/2015   Procedure: Left Heart Cath and Coronary Angiography;  Surgeon: Leonie Man, MD;  Location: [facility];  Service: Cardiovascular;  Laterality: N/A;  . CARDIAC CATHETERIZATION N/A 02/06/2015   Procedure: Coronary Stent Intervention;  Surgeon: Leonie Man, MD;  Location: [facility];  Service: Cardiovascular;  Laterality: N/A;  . COLONOSCOPY WITH PROPOFOL N/A 10/09/2016   Procedure: COLONOSCOPY WITH PROPOFOL;  Surgeon: Daneil Dolin, MD;  [facility];  Service: Endoscopy;  Laterality: N/A;  12:00 pm  . COLONOSCOPY WITH PROPOFOL N/A 11/24/2016   Procedure: COLONOSCOPY WITH PROPOFOL;  Surgeon: Daneil Dolin, MD;  [facility];  Service: Endoscopy;  Laterality:  N/A;  200  . CORONARY ANGIOPLASTY WITH STENT PLACEMENT  01/2015  . ESOPHAGOGASTRODUODENOSCOPY (EGD) WITH PROPOFOL N/A 10/09/2016   Procedure: ESOPHAGOGASTRODUODENOSCOPY (EGD) WITH PROPOFOL;  Surgeon: Daneil Dolin, MD;  [facility];  Service: Endoscopy;  Laterality: N/A;  . INCISION / DRAINAGE HAND / FINGER    . POLYPECTOMY  11/24/2016   Procedure: POLYPECTOMY;  Surgeon: Daneil Dolin, MD;  [facility];  Service: Endoscopy;;  descending and sigmoid  . TOTAL HIP ARTHROPLASTY Right 07/01/2016  . TOTAL HIP ARTHROPLASTY Right 07/01/2016    Procedure: RIGHT TOTAL HIP ARTHROPLASTY ANTERIOR APPROACH;  Surgeon: Mcarthur Rossetti, MD;  Location: [facility];  Service: Orthopedics;  Laterality: Right;     reports that he has been smoking Cigarettes.  He started smoking about 37 years ago. He has a 11.50 pack-year smoking history. He has quit using smokeless tobacco. His smokeless tobacco use included Chew. He reports that he drinks about 3.0 - 3.6 oz of alcohol per week . He reports that he does not use drugs.  Allergies  Allergen Reactions  . Carvedilol Other (See Comments)    Sinus pause on telemetry >3 seconds. Longest one 9 sec. No AV nodal agent  . Lisinopril Anaphylaxis, Shortness Of Breath and Swelling    Angioedema, required intubation and mechanical ventilation  . Amoxicillin Nausea And Vomiting    Has patient had a PCN reaction causing immediate rash, facial/tongue/throat swelling, SOB or lightheadedness with hypotension: Unknown Has patient had a PCN reaction causing severe rash involving mucus membranes or skin necrosis: Unknown Has patient had a PCN reaction that required hospitalization Unknown Has patient had a PCN reaction occurring within the last 10 years: No If all of the above answers are "NO", then may proceed with Cephalosporin use.     Family History  Problem Relation Age of Onset  . Heart attack Father   . Stroke Father   . Cancer Mother        ???    Prior to Admission medications   Medication Sig Start Date End Date Taking? Authorizing Provider  albuterol (PROVENTIL HFA;VENTOLIN HFA) 108 (90 Base) MCG/ACT inhaler Inhale 2 puffs into the lungs every 6 (six) hours as needed for wheezing or shortness of breath. 04/24/16  Yes Soyla Dryer, PA-C  albuterol (PROVENTIL) (2.5 MG/3ML) 0.083% nebulizer solution Take 3 mLs (2.5 mg total) by nebulization every 6 (six) hours as needed for wheezing or shortness of breath. 05/27/16  Yes Soyla Dryer, PA-C  amLODipine (NORVASC) 10 MG tablet Take 1 tablet (10 mg  total) by mouth daily. 10/06/16  Yes Soyla Dryer, PA-C  aspirin EC 81 MG tablet Take 1 tablet (81 mg total) by mouth 2 (two) times daily after a meal. Patient taking differently: Take 81 mg by mouth daily.  07/03/16  Yes Mcarthur Rossetti, MD  atorvastatin (LIPITOR) 20 MG tablet Take 1 tablet (20 mg total) by mouth daily. Patient taking differently: Take 20 mg by mouth daily at 6 PM.  09/10/16  Yes Soyla Dryer, PA-C  Carboxymeth-Glycerin-Polysorb (REFRESH OPTIVE ADVANCED) 0.5-1-0.5 % SOLN Apply 1 drop to eye 2 (two) times daily as needed (dry eyes).    Yes [provider]  citalopram (CELEXA) 10 MG tablet Take 10 mg by mouth daily.   Yes [provider]  cycloSPORINE (RESTASIS) 0.05 % ophthalmic emulsion Place 1 drop into both eyes 2 (two) times daily.   Yes [provider]  ibuprofen (ADVIL,MOTRIN) 600 MG tablet Take 1 tablet (  600 mg total) by mouth every 6 (six) hours as needed. 12/22/16  Yes Shary Decamp, PA-C  isosorbide mononitrate (IMDUR) 30 MG 24 hr tablet Take 0.5 tablets (15 mg total) by mouth daily. 10/06/16  Yes BranchAlphonse Guild, MD  Menthol, Topical Analgesic, (BENGAY EX) Apply 1 application topically 2 (two) times daily as needed (for muscle aches/pain.).    Yes [provider]  metFORMIN (GLUCOPHAGE) 1000 MG tablet Take 1 tablet (1,000 mg total) by mouth 2 (two) times daily with a meal. 10/06/16  Yes Soyla Dryer, PA-C  mometasone-formoterol (DULERA) 100-5 MCG/ACT AERO Inhale 2 puffs into the lungs 2 (two) times daily. 02/11/17  Yes Soyla Dryer, PA-C  nicotine (NICODERM CQ) 7 mg/24hr patch Place 1 patch (7 mg total) onto the skin daily. Patient taking differently: Place 7 mg onto the skin once a week.  08/11/16  Yes Soyla Dryer, PA-C  nitroGLYCERIN (NITROSTAT) 0.4 MG SL tablet Place 1 tablet (0.4 mg total) under the tongue every 5 (five) minutes as needed for chest pain. 02/09/15  Yes Almyra Deforest, PA  Olopatadine HCl (PAZEO) 0.7 %  SOLN Place 1 drop into both eyes daily.    Yes [provider]  Omega-3 Fatty Acids (FISH OIL) 1200 MG CAPS Take 1,200 mg by mouth 2 (two) times daily.   Yes [provider]  pantoprazole (PROTONIX) 40 MG tablet Take 1 tablet (40 mg total) by mouth daily. 10/13/16  Yes Rourk, Cristopher Estimable, MD  sildenafil (VIAGRA) 100 MG tablet Take 0.5 tablets (50 mg total) by mouth daily as needed for erectile dysfunction. 12/11/16  Yes BranchAlphonse Guild, MD  traZODone (DESYREL) 100 MG tablet Take 100 mg by mouth at bedtime.    Yes [provider]  methocarbamol (ROBAXIN) 500 MG tablet Take 1 tablet (500 mg total) by mouth 2 (two) times daily. Patient not taking: Reported on 02/17/2017 12/22/16   Shary Decamp, PA-C    Physical Exam: Vitals:   02/17/17 1044 02/17/17 1100 02/17/17 1119 02/17/17 1330  BP:  138/79  (!) 167/89  Pulse:  81  75  Resp:  (!) 32  (!) 30  Temp:      TempSrc:      SpO2: 91% (!) 87% (!) 89% (!) 88%  Weight:      Height:        Constitutional: NAD, calm, comfortable Vitals:   02/17/17 1044 02/17/17 1100 02/17/17 1119 02/17/17 1330  BP:  138/79  (!) 167/89  Pulse:  81  75  Resp:  (!) 32  (!) 30  Temp:      TempSrc:      SpO2: 91% (!) 87% (!) 89% (!) 88%  Weight:      Height:       Eyes: PERRL, lids and conjunctivae normal ENMT: Mucous membranes are moist. Posterior pharynx clear of any exudate or lesions.Normal dentition.  Neck: normal, supple, no masses, no thyromegaly Respiratory: Diminished breath sounds with wheeze bilaterally.  Cardiovascular: Regular rate and rhythm, no murmurs / rubs / gallops. 1+ extremity edema (chronic). 2+ pedal pulses. No carotid bruits.  Abdomen: no tenderness, no masses palpated. No hepatosplenomegaly. Bowel sounds positive.  Musculoskeletal: no clubbing / cyanosis. No joint deformity upper and lower extremities. Good ROM, no contractures. Normal muscle tone.  Skin: no rashes, lesions, ulcers. No induration Neurologic: CN  2-12 grossly intact. Sensation intact, DTR normal. Strength 5/5 in all 4.  Psychiatric: Normal judgment and insight. Alert and oriented x 3. Normal mood.  Labs on Admission: I have personally reviewed following labs and imaging studies  CBC:  Recent Labs Lab 02/17/17 1029  WBC 8.8  HGB 13.7  HCT 44.6  MCV 84.0  PLT 536   Basic Metabolic Panel:  Recent Labs Lab 02/17/17 1029  NA 136  K 3.6  CL 96*  CO2 32  GLUCOSE 256*  BUN 7  CREATININE 0.75  CALCIUM 8.7*   GFR: Estimated Creatinine Clearance: 123 mL/min (by C-G formula based on SCr of 0.75 mg/dL). Liver Function Tests: No results for input(s): AST, ALT, ALKPHOS, BILITOT, PROT, ALBUMIN in the last 168 hours. No results for input(s): LIPASE, AMYLASE in the last 168 hours. No results for input(s): AMMONIA in the last 168 hours. Coagulation Profile: No results for input(s): INR, PROTIME in the last 168 hours. Cardiac Enzymes:  Recent Labs Lab 02/17/17 1030  TROPONINI 0.17*   BNP (last 3 results) No results for input(s): PROBNP in the last 8760 hours. HbA1C: No results for input(s): HGBA1C in the last 72 hours. CBG: No results for input(s): GLUCAP in the last 168 hours. Lipid Profile: No results for input(s): CHOL, HDL, LDLCALC, TRIG, CHOLHDL, LDLDIRECT in the last 72 hours. Thyroid Function Tests: No results for input(s): TSH, T4TOTAL, FREET4, T3FREE, THYROIDAB in the last 72 hours. Anemia Panel: No results for input(s): VITAMINB12, FOLATE, FERRITIN, TIBC, IRON, RETICCTPCT in the last 72 hours. Urine analysis:    Component Value Date/Time   COLORURINE AMBER (A) 04/03/2016 0301   APPEARANCEUR CLEAR 04/03/2016 0301   LABSPEC >1.030 (H) 04/03/2016 0301   PHURINE 5.5 04/03/2016 0301   GLUCOSEU NEGATIVE 04/03/2016 0301   HGBUR TRACE (A) 04/03/2016 0301   BILIRUBINUR NEGATIVE 04/03/2016 0301   KETONESUR NEGATIVE 04/03/2016 0301   PROTEINUR TRACE (A) 04/03/2016 0301   UROBILINOGEN 0.2 05/30/2015 1628    NITRITE NEGATIVE 04/03/2016 0301   LEUKOCYTESUR SMALL (A) 04/03/2016 0301    Radiological Exams on Admission: Dg Chest 2 View  Result Date: 02/17/2017 CLINICAL DATA:  50 year old male with shortness of breath and chest pain for 4 days. Possible recent lifting injury. Smoker. EXAM: CHEST  2 VIEW COMPARISON:  12/10/2015, and earlier FINDINGS: Stable lung volumes. Chronic increased pulmonary interstitial markings have not definitely changed since 2017. No superimposed pneumothorax, pleural effusion, or confluent pulmonary opacity. Stable cardiac size and mediastinal contours. Right side aortic arch, and mirror image branching demonstrated by chest CTA 07/09/2015. Visualized tracheal air column is within normal limits. No acute osseous abnormality identified. Negative visible bowel gas pattern. IMPRESSION: 1.  No acute cardiopulmonary abnormality. 2. Chronic pulmonary interstitial changes appear stable since 2017 and might relate to smoking. 3. Situs abnormality again noted in the chest with right side aortic arch and descending aorta with mirror image branching as seen on chest CTA 07/09/2015. Electronically Signed   By: Genevie Ann M.D.   On: 02/17/2017 12:26   Dg Lumbar Spine Complete  Result Date: 02/17/2017 CLINICAL DATA:  51 year old male with shortness of breath and chest pain for 4 days. Possible recent lifting injury. Smoker. EXAM: LUMBAR SPINE - COMPLETE 4+ VIEW COMPARISON:  Lumbar radiographs 09/12/2016. CT Abdomen and Pelvis 04/20/2016. FINDINGS: Hypoplastic ribs designated at T12, with full size ribs at T11 for the purposes of this report. This results in normal lumbar segmentation. Advanced chronic disc and endplate degeneration at L5-S1. Vacuum disc phenomena there on the 2007 CT. Trace anterolisthesis of L4 on L5, with otherwise straightening of lumbar lordosis. Moderate to severe lower lumbar facet hypertrophy. Stable vertebral  height and alignment since 2017. No pars fracture. Visible lower  thoracic levels appear intact. Stable and negative sacral ala and SI joints. Chronic right hip arthroplasty. Aortoiliac calcified atherosclerosis. IMPRESSION: 1.  No acute osseous abnormality in the lumbar spine. 2. Advanced chronic degenerative changes of the facets at L4-L5 and L5-S1, and disc and endplates at Y0-D9. 3.  Calcified aortic atherosclerosis. Electronically Signed   By: Genevie Ann M.D.   On: 02/17/2017 12:37    EKG: Independently reviewed. Sinus rhythm without acute ischemic changes  Assessment/Plan Active Problems:   Essential hypertension   CAD (coronary artery disease)   Acute respiratory failure with hypoxia ([facility])   Cigarette nicotine dependence with nicotine-induced disorder   Chest pain   Type 2 diabetes mellitus without complication, without long-term current use of insulin ([facility])   COPD with acute exacerbation ([facility])   Elevated troponin    1. COPD exacerbation. Patient has diminished breath sounds with bilateral wheezes. Start the patient on bronchodilators, antibiotics and intravenous steroids. 2. Chest discomfort. I suspect this is related to COPD exacerbation. EKG does not appear to be acute. 3. Elevated troponin. Suspect demand ischemia. His chest discomfort is not typical. Since he does have history of coronary disease, will check echocardiogram and continue to cycle troponins. Monitor on telemetry. We'll also check d-dimer. 4. Diabetes. Hold metformin. Continue on sliding scale insulin. 5. Acute respiratory failure with hypoxia. Related to COPD flare. Wean off oxygen as tolerated. 6. Coronary artery disease. Troponin is mildly elevated, although this is likely demand ischemia as opposed to true ACS. We'll continue to monitor troponin. Continue on aspirin. 7. Hypertension. Continue outpatient regimen. 8. Tobacco use. Counseled on importance of tobacco cessation.  DVT prophylaxis: lovenox Code Status: full code Family Communication: discussed with family at the  bedside Disposition Plan: discharge home once improved Consults called:  Admission status: observation, tele   MEMON,JEHANZEB MD [facility] Pager [phone]  If 7PM-7AM, please contact night-coverage [email or web address] Password [email or web address]  02/17/2017, 2:28 PM

## 2017-02-17 NOTE — ED Provider Notes (Signed)
[facility] DEPT Provider Note   CSN: [id number] Arrival date & time: 02/17/17  0957     History   Chief Complaint Chief Complaint  Patient presents with  . Shortness of Breath    HPI Randy Hebert is a 51 y.o. male.  HPI Patient presents with increasing shortness of breath for the last few days. History of COPD. Has had a little cough but not really bringing anything up. States it feels that there is something in his throat that needs to come up no fevers. States he's been worse since he lifted a heavy bench and carried it 3 days ago. States that increase his chronic lower back pain. States he has a history of osteoporosis. States he is partially not breathing as well because. Repeat the due to the back pain. No fevers. No chest pain. States he has had 2 previous heart attacks but does not always have chest pain.   Past Medical History:  Diagnosis Date  . Anxiety   . Arthritis   . Back pain   . Bulging of cervical intervertebral disc   . CAD (coronary artery disease)    lateral STEMI 02/06/2015 00% D1 occlusion treated with Promus Premier 2.5 mm x 16 mm DES, 70% ramus stenosis, 40% mid RCA stenosis, 45% distal RCA stenosis, EF 45-50%  . CHF (congestive heart failure) ([place])   . COPD (chronic obstructive pulmonary disease) ([place])   . Depression   . Diabetes mellitus without complication ([place])   . 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 Dry eye   . Dyspnea   . GERD (gastroesophageal reflux disease)   . Headache   . Hip pain   . MI (myocardial infarction) ([place])   . Neck pain   . Otitis media   . Pleurisy   . Sinus pause    9 sec sinus pause on telemetry after started on coreg after MI, avoid AV nodal blocking agent    Patient Active Problem List   Diagnosis Date Noted  . GERD (gastroesophageal reflux disease) 09/23/2016  . Early satiety 09/23/2016  . Abdominal pain, chronic, epigastric 09/23/2016  . Rectal bleeding  09/23/2016  . Unilateral primary osteoarthritis, right hip 07/01/2016  . 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 Type 2 diabetes mellitus without complication, without long-term current use of insulin ([place]) 04/26/2016  . Right hip pain 12/19/2015  . Chest pain 12/10/2015  . Chronic obstructive pulmonary disease ([place]) 08/27/2015  . Cigarette nicotine dependence with nicotine-induced disorder 08/27/2015  . Hyperlipidemia 08/27/2015  . Obesity, unspecified 08/27/2015  . Dyspnea 07/11/2015  . Acute respiratory failure with hypoxia ([place]) 06/02/2015  . Acute respiratory failure with hypoxemia ([place]) 06/02/2015  . Acute on chronic systolic heart failure ([place]) 06/02/2015  . ACE inhibitor-aggravated angioedema   . Chronic systolic CHF (congestive heart failure) ([place]) 06/01/2015  . CAD (coronary artery disease) 05/31/2015  . STEMI (ST elevation myocardial infarction) ([place]) 05/31/2015  . Tobacco use disorder 05/31/2015  . Angioedema 05/30/2015  . Sinus pause secondary to beta blocker 02/09/2015  . Essential hypertension 02/09/2015  . ST elevation myocardial infarction (STEMI) of lateral wall, initial episode of care [facility]) 02/06/2015    Class: Hospitalized for    Past Surgical History:  Procedure Laterality Date  . BIOPSY  10/09/2016   Procedure: BIOPSY;  Surgeon: Daneil Dolin, MD;  [facility];  Service: Endoscopy;;  . CARDIAC CATHETERIZATION N/A 02/06/2015   Procedure:  Left Heart Cath and Coronary Angiography;  Surgeon: Leonie Man, MD;  Location: [facility];  Service: Cardiovascular;  Laterality: N/A;  . CARDIAC CATHETERIZATION N/A 02/06/2015   Procedure: Coronary Stent Intervention;  Surgeon: Leonie Man, MD;  Location: [facility];  Service: Cardiovascular;  Laterality: N/A;  . COLONOSCOPY WITH PROPOFOL N/A 10/09/2016   Procedure: COLONOSCOPY WITH PROPOFOL;  Surgeon: Daneil Dolin, MD;  [facility];  Service: Endoscopy;   Laterality: N/A;  12:00 pm  . COLONOSCOPY WITH PROPOFOL N/A 11/24/2016   Procedure: COLONOSCOPY WITH PROPOFOL;  Surgeon: Daneil Dolin, MD;  [facility];  Service: Endoscopy;  Laterality: N/A;  200  . CORONARY ANGIOPLASTY WITH STENT PLACEMENT  01/2015  . ESOPHAGOGASTRODUODENOSCOPY (EGD) WITH PROPOFOL N/A 10/09/2016   Procedure: ESOPHAGOGASTRODUODENOSCOPY (EGD) WITH PROPOFOL;  Surgeon: Daneil Dolin, MD;  [facility];  Service: Endoscopy;  Laterality: N/A;  . INCISION / DRAINAGE HAND / FINGER    . POLYPECTOMY  11/24/2016   Procedure: POLYPECTOMY;  Surgeon: Daneil Dolin, MD;  [facility];  Service: Endoscopy;;  descending and sigmoid  . TOTAL HIP ARTHROPLASTY Right 07/01/2016  . TOTAL HIP ARTHROPLASTY Right 07/01/2016   Procedure: RIGHT TOTAL HIP ARTHROPLASTY ANTERIOR APPROACH;  Surgeon: Mcarthur Rossetti, MD;  Location: [facility];  Service: Orthopedics;  Laterality: Right;       Home Medications    Prior to Admission medications   Medication Sig Start Date End Date Taking? Authorizing Provider  albuterol (PROVENTIL HFA;VENTOLIN HFA) 108 (90 Base) MCG/ACT inhaler Inhale 2 puffs into the lungs every 6 (six) hours as needed for wheezing or shortness of breath. 04/24/16  Yes Soyla Dryer, PA-C  albuterol (PROVENTIL) (2.5 MG/3ML) 0.083% nebulizer solution Take 3 mLs (2.5 mg total) by nebulization every 6 (six) hours as needed for wheezing or shortness of breath. 05/27/16  Yes Soyla Dryer, PA-C  amLODipine (NORVASC) 10 MG tablet Take 1 tablet (10 mg total) by mouth daily. 10/06/16  Yes Soyla Dryer, PA-C  aspirin EC 81 MG tablet Take 1 tablet (81 mg total) by mouth 2 (two) times daily after a meal. Patient taking differently: Take 81 mg by mouth daily.  07/03/16  Yes Mcarthur Rossetti, MD  atorvastatin (LIPITOR) 20 MG tablet Take 1 tablet (20 mg total) by mouth daily. Patient taking differently: Take 20 mg by mouth daily at 6 PM.  09/10/16  Yes  Soyla Dryer, PA-C  Carboxymeth-Glycerin-Polysorb (REFRESH OPTIVE ADVANCED) 0.5-1-0.5 % SOLN Apply 1 drop to eye 2 (two) times daily as needed (dry eyes).    Yes [provider]  citalopram (CELEXA) 10 MG tablet Take 10 mg by mouth daily.   Yes [provider]  cycloSPORINE (RESTASIS) 0.05 % ophthalmic emulsion Place 1 drop into both eyes 2 (two) times daily.   Yes [provider]  ibuprofen (ADVIL,MOTRIN) 600 MG tablet Take 1 tablet (600 mg total) by mouth every 6 (six) hours as needed. 12/22/16  Yes Shary Decamp, PA-C  isosorbide mononitrate (IMDUR) 30 MG 24 hr tablet Take 0.5 tablets (15 mg total) by mouth daily. 10/06/16  Yes BranchAlphonse Guild, MD  Menthol, Topical Analgesic, (BENGAY EX) Apply 1 application topically 2 (two) times daily as needed (for muscle aches/pain.).    Yes [provider]  metFORMIN (GLUCOPHAGE) 1000 MG tablet Take 1 tablet (1,000 mg total) by mouth 2 (two) times daily with a meal. 10/06/16  Yes Soyla Dryer, PA-C  mometasone-formoterol (  DULERA) 100-5 MCG/ACT AERO Inhale 2 puffs into the lungs 2 (two) times daily. 02/11/17  Yes Soyla Dryer, PA-C  nicotine (NICODERM CQ) 7 mg/24hr patch Place 1 patch (7 mg total) onto the skin daily. Patient taking differently: Place 7 mg onto the skin once a week.  08/11/16  Yes Soyla Dryer, PA-C  nitroGLYCERIN (NITROSTAT) 0.4 MG SL tablet Place 1 tablet (0.4 mg total) under the tongue every 5 (five) minutes as needed for chest pain. 02/09/15  Yes Almyra Deforest, PA  Olopatadine HCl (PAZEO) 0.7 % SOLN Place 1 drop into both eyes daily.    Yes [provider]  Omega-3 Fatty Acids (FISH OIL) 1200 MG CAPS Take 1,200 mg by mouth 2 (two) times daily.   Yes [provider]  pantoprazole (PROTONIX) 40 MG tablet Take 1 tablet (40 mg total) by mouth daily. 10/13/16  Yes Rourk, Cristopher Estimable, MD  sildenafil (VIAGRA) 100 MG tablet Take 0.5 tablets (50 mg total) by mouth daily as needed for erectile  dysfunction. 12/11/16  Yes BranchAlphonse Guild, MD  traZODone (DESYREL) 100 MG tablet Take 100 mg by mouth at bedtime.    Yes [provider]  methocarbamol (ROBAXIN) 500 MG tablet Take 1 tablet (500 mg total) by mouth 2 (two) times daily. Patient not taking: Reported on 02/17/2017 12/22/16   Shary Decamp, PA-C    Family History Family History  Problem Relation Age of Onset  . Heart attack Father   . Stroke Father   . Cancer Mother        ???    Social History Social History  Substance Use Topics  . Smoking status: Current Some Day Smoker    Packs/day: 0.25    Years: 46.00    Types: Cigarettes    Start date: 06/07/1979  . Smokeless tobacco: Former Systems developer    Types: Chew     Comment: plans to get patches  . 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 now drinks on occasion.     Allergies   Carvedilol; Lisinopril; and Amoxicillin   Review of Systems Review of Systems  Constitutional: Negative for appetite change and fever.  HENT: Negative for congestion.   Respiratory: Positive for cough, shortness of breath and wheezing.   Cardiovascular: Negative for chest pain.  Gastrointestinal: Negative for abdominal distention.  Genitourinary: Negative for dysuria.  Musculoskeletal: Positive for back pain.  Hematological: Negative for adenopathy.  Psychiatric/Behavioral: Negative for confusion.     Physical Exam Updated Vital Signs BP 135/77 (BP Location: Left Arm)   Pulse 76   Temp 98.5 F (36.9 C) (Oral)   Resp 18   Ht 5\' 5"  (1.651 m)   Wt 108.9 kg (240 lb)   SpO2 (!) 89%   BMI 39.94 kg/m   Physical Exam  Constitutional: He appears well-developed and well-nourished.  Eyes: EOM are normal.  Cardiovascular: Normal rate.   Pulmonary/Chest: He has wheezes. He has no rales.  Wheezes with prolonged expirations. No focal rales.  Abdominal: Soft. There is no tenderness.    Musculoskeletal: He exhibits no edema.  Neurological: He is alert.  Skin: Skin is warm. Capillary refill takes less than 2 seconds.  Psychiatric: He has a normal mood and affect.     ED Treatments / Results  Labs (all labs ordered are listed, but only abnormal results are displayed) Labs Reviewed  BASIC METABOLIC PANEL - Abnormal; Notable for the following:       Result Value   Chloride 96 (*)    Glucose, Bld 256 (*)    Calcium 8.7 (*)    All other components within normal limits  CBC - Abnormal; Notable for the following:    MCH 25.8 (*)    All other components within normal limits  TROPONIN I - Abnormal; Notable for the following:    Troponin I 0.17 (*)    All other components within normal limits    EKG  EKG Interpretation  Date/Time:  Tuesday February 17 2017 10:06:58 EDT Ventricular Rate:  58 PR Interval:    QRS Duration: 142 QT Interval:  400 QTC Calculation: 393 R Axis:   102 Text Interpretation:  Sinus arrhythmia Prolonged PR interval RBBB and LPFB Baseline wander in lead(s) V4 Confirmed by Alvino Chapel  MD, Derrall Hicks [phone]) on 02/17/2017 10:45:39 AM       Radiology Dg Chest 2 View  Result Date: 02/17/2017 CLINICAL DATA:  51 year old male with shortness of breath and chest pain for 4 days. Possible recent lifting injury. Smoker. EXAM: CHEST  2 VIEW COMPARISON:  12/10/2015, and earlier FINDINGS: Stable lung volumes. Chronic increased pulmonary interstitial markings have not definitely changed since 2017. No superimposed pneumothorax, pleural effusion, or confluent pulmonary opacity. Stable cardiac size and mediastinal contours. Right side aortic arch, and mirror image branching demonstrated by chest CTA 07/09/2015. Visualized tracheal air column is within normal limits. No acute osseous abnormality identified. Negative visible bowel gas pattern. IMPRESSION: 1.  No acute cardiopulmonary abnormality. 2. Chronic pulmonary interstitial changes appear stable since 2017 and might  relate to smoking. 3. Situs abnormality again noted in the chest with right side aortic arch and descending aorta with mirror image branching as seen on chest CTA 07/09/2015. Electronically Signed   By: Genevie Ann M.D.   On: 02/17/2017 12:26   Dg Lumbar Spine Complete  Result Date: 02/17/2017 CLINICAL DATA:  51 year old male with shortness of breath and chest pain for 4 days. Possible recent lifting injury. Smoker. EXAM: LUMBAR SPINE - COMPLETE 4+ VIEW COMPARISON:  Lumbar radiographs 09/12/2016. CT Abdomen and Pelvis 04/20/2016. FINDINGS: Hypoplastic ribs designated at T12, with full size ribs at T11 for the purposes of this report. This results in normal lumbar segmentation. Advanced chronic disc and endplate degeneration at L5-S1. Vacuum disc phenomena there on the 2007 CT. Trace anterolisthesis of L4 on L5, with otherwise straightening of lumbar lordosis. Moderate to severe lower lumbar facet hypertrophy. Stable vertebral height and alignment since 2017. No pars fracture. Visible lower thoracic levels appear intact. Stable and negative sacral ala and SI joints. Chronic right hip arthroplasty. Aortoiliac calcified atherosclerosis. IMPRESSION: 1.  No acute osseous abnormality in the lumbar spine. 2. Advanced chronic degenerative changes of the facets at L4-L5 and L5-S1, and disc and endplates at F5-D3. 3.  Calcified aortic atherosclerosis. Electronically Signed   By: Genevie Ann M.D.   On: 02/17/2017 12:37    Procedures Procedures (including critical care time)  Medications Ordered in ED Medications  albuterol (PROVENTIL) (2.5 MG/3ML) 0.083% nebulizer solution 5 mg (5 mg Nebulization Given 02/17/17 1119)  ipratropium (ATROVENT) nebulizer solution 0.5 mg (0.5 mg Nebulization Given 02/17/17 1119)     Initial Impression / Assessment and Plan / ED Course  I have reviewed the triage vital signs and the nursing notes.  Pertinent labs & imaging results that were available during my care of the patient were  reviewed by  me and considered in my medical decision making (see chart for details).     Patient with shortness of breath since Friday.Marland Kitchen Has low back pain. States the pain is worse when he breathes. States the pain however is in his lower back. Has a history of COPD. States he feels this COPD is flaring up. EKG shows a right bundle blanch block but no ST changes. Has some hypoxia on room air. Improved on oxygen. Troponin bili elevated. I think likely the elevated troponin is due to demand due to the hypoxia as opposed to primary cardiac issue. However I think you require admission. Will admit to hospitalist.  Final Clinical Impressions(s) / ED Diagnoses   Final diagnoses:  COPD exacerbation ([place])  Hypoxia  Elevated troponin I level    New Prescriptions New Prescriptions   No medications on file     Davonna Belling, MD 02/17/17 1245

## 2017-02-18 DIAGNOSIS — E119 Type 2 diabetes mellitus without complications: Secondary | ICD-10-CM | POA: Diagnosis not present

## 2017-02-18 DIAGNOSIS — I2510 Atherosclerotic heart disease of native coronary artery without angina pectoris: Secondary | ICD-10-CM

## 2017-02-18 DIAGNOSIS — I10 Essential (primary) hypertension: Secondary | ICD-10-CM | POA: Diagnosis not present

## 2017-02-18 DIAGNOSIS — J441 Chronic obstructive pulmonary disease with (acute) exacerbation: Secondary | ICD-10-CM | POA: Diagnosis not present

## 2017-02-18 DIAGNOSIS — R748 Abnormal levels of other serum enzymes: Secondary | ICD-10-CM | POA: Diagnosis not present

## 2017-02-18 LAB — HIV ANTIBODY (ROUTINE TESTING W REFLEX): HIV SCREEN 4TH GENERATION: NONREACTIVE

## 2017-02-18 LAB — GLUCOSE, CAPILLARY: GLUCOSE-CAPILLARY: 249 mg/dL — AB (ref 65–99)

## 2017-02-18 MED ORDER — SALINE SPRAY 0.65 % NA SOLN
1.0000 | NASAL | Status: DC | PRN
  Administered 2017-02-18: 1 via NASAL
  Filled 2017-02-18: qty 44

## 2017-02-18 MED ORDER — LEVOFLOXACIN 500 MG PO TABS: 500.0000 mg | ORAL_TABLET | Freq: Every day | ORAL | 0 refills | Status: DC

## 2017-02-18 MED ORDER — PREDNISONE 50 MG PO TABS: 50.0000 mg | ORAL_TABLET | Freq: Every day | ORAL | 0 refills | Status: DC

## 2017-02-18 MED ORDER — GUAIFENESIN ER 600 MG PO TB12: 1200.0000 mg | ORAL_TABLET | Freq: Two times a day (BID) | ORAL | 0 refills | Status: DC | PRN

## 2017-02-18 MED ORDER — PREDNISONE 20 MG PO TABS: 50.0000 mg | ORAL_TABLET | Freq: Every day | ORAL | Status: DC

## 2017-02-18 NOTE — Discharge Summary (Signed)
Physician Discharge Summary  Randy Hebert FAO:[id number] DOB: [birth year] DOA: 02/17/2017  PCP: Soyla Dryer, PA-C  Admit date: 02/17/2017 Discharge date: 02/18/2017  Admitted From:Home Disposition:  Home  Recommendations for Outpatient Follow-up:  1. Follow up with PCP in 1 week 2. Follows up with Cardiologist, Dr [name] in 1-2 weeks 3. Take Prednisone 50mg  orally daily for 5 days  4. Continue using Nebulizer/Inhaler at home every 4-6 hours at home for now for next 2-3 days  5. Quit smoking tobacco  6. Take Levaquin orally for 5 days    Home Health: No Equipment/Devices No  Discharge Condition: Stable CODE STATUS: Full  Diet recommendation: Heart Healthy/Carb Modified  Brief/Interim Summary: 51 year old male with past medical history of hypertension, diabetes type 2, coronary artery disease, COPD with tobacco use came to the ER with complaints of shortness of breath and mild chest discomfort. Upon admission he was found to be in COPD exacerbation with mildly elevated troponins. He was started on bronchodilators, IV steroids and Levaquin. EKG did not show any acute changes and his troponin slightly trended up to 0.3 and then trended down. Echocardiogram showed ejection fraction of 55% otherwise no wall motion abnormality. Over the course of 24 hours his breathing improved significantly and his chest pain had resolved. On the day of discharge his steroids were converted to oral prednisone. Upon reviewing the records it's noted that beta blocker causes sinus bradycardia and has history of angioedema from ACE inhibitor's/ARB. Today he has reached maximum benefit from in hospital stay treatment and is deemed stable to be discharged with outpatient follow-up with above recommendations.  Discharge Diagnoses:  Active Problems:   Essential hypertension   CAD (coronary artery disease)   Acute respiratory failure with hypoxia (HCC)   Cigarette nicotine dependence with nicotine-induced  disorder   Chest pain   Type 2 diabetes mellitus without complication, without long-term current use of insulin (HCC)   COPD with acute exacerbation (HCC)   Elevated troponin  Mild exacerbation of moderate persistent COPD, improved -Continue bronchodilators, antibiotics. We'll switch his IV steroids to oral -Supplemental oxygen as needed -Counseled to quit tobacco  -We will ambulate him and see how he is doing  Elevated troponin, resolved -No acute EKG changes. Trended down troponins -Likely secondary to demand ischemia -D-dimer is negative -Echocardiogram shows ejection fraction 55% with normal wall motion. 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 [name] outpatient in 1-2 weeks  Diabetes type 2 -Insulin sliding scale and Accu-Cheks -Resume metformin upon discharge  Coronary artery disease -Continue statin and aspirin -Not on beta blockers due to bradycardia and not on ACE inhibitor/ARB secondary to history of angioedema per outpatient cardiology note  Hypertension -Continue Norvasc  Discharge Instructions   Allergies as of 02/18/2017      Reactions   Carvedilol Other (See Comments)   Sinus pause on telemetry >3 seconds. Longest one 9 sec. No AV nodal agent   Lisinopril Anaphylaxis, Shortness Of Breath, Swelling   Angioedema, required intubation and mechanical ventilation   Amoxicillin Nausea And Vomiting   Has patient had a PCN reaction causing immediate rash, facial/tongue/throat swelling, SOB or lightheadedness with hypotension: Unknown Has patient had a PCN reaction causing severe rash involving mucus membranes or skin necrosis: Unknown Has patient had a PCN reaction that required hospitalization Unknown Has patient had a PCN reaction occurring within the last 10 years: No If all of the above answers are "NO", then may proceed with Cephalosporin use.  Medication List    TAKE these medications    albuterol 108 (90 Base) MCG/ACT inhaler Commonly known as:  PROVENTIL HFA;VENTOLIN HFA Inhale 2 puffs into the lungs every 6 (six) hours as needed for wheezing or shortness of breath.   albuterol (2.5 MG/3ML) 0.083% nebulizer solution Commonly known as:  PROVENTIL Take 3 mLs (2.5 mg total) by nebulization every 6 (six) hours as needed for wheezing or shortness of breath.   amLODipine 10 MG tablet Commonly known as:  NORVASC Take 1 tablet (10 mg total) by mouth daily.   aspirin EC 81 MG tablet Take 1 tablet (81 mg total) by mouth 2 (two) times daily after a meal. What changed:  when to take this   atorvastatin 20 MG tablet Commonly known as:  LIPITOR Take 1 tablet (20 mg total) by mouth daily. What changed:  when to take this   BENGAY EX Apply 1 application topically 2 (two) times daily as needed (for muscle aches/pain.).   citalopram 10 MG tablet Commonly known as:  CELEXA Take 10 mg by mouth daily.   cycloSPORINE 0.05 % ophthalmic emulsion Commonly known as:  RESTASIS Place 1 drop into both eyes 2 (two) times daily.   Fish Oil 1200 MG Caps Take 1,200 mg by mouth 2 (two) times daily.   guaiFENesin 600 MG 12 hr tablet Commonly known as:  MUCINEX Take 2 tablets (1,200 mg total) by mouth 2 (two) times daily as needed.   ibuprofen 600 MG tablet Commonly known as:  ADVIL,MOTRIN Take 1 tablet (600 mg total) by mouth every 6 (six) hours as needed.   isosorbide mononitrate 30 MG 24 hr tablet Commonly known as:  IMDUR Take 0.5 tablets (15 mg total) by mouth daily.   levofloxacin 500 MG tablet Commonly known as:  LEVAQUIN Take 1 tablet (500 mg total) by mouth daily. Start taking on:  02/19/2017   metFORMIN 1000 MG tablet Commonly known as:  GLUCOPHAGE Take 1 tablet (1,000 mg total) by mouth 2 (two) times daily with a meal.   methocarbamol 500 MG tablet Commonly known as:  ROBAXIN Take 1 tablet (500 mg total) by mouth 2 (two) times daily.   mometasone-formoterol  100-5 MCG/ACT Aero Commonly known as:  DULERA Inhale 2 puffs into the lungs 2 (two) times daily.   nicotine 7 mg/24hr patch Commonly known as:  NICODERM CQ Place 1 patch (7 mg total) onto the skin daily. What changed:  when to take this   nitroGLYCERIN 0.4 MG SL tablet Commonly known as:  NITROSTAT Place 1 tablet (0.4 mg total) under the tongue every 5 (five) minutes as needed for chest pain.   pantoprazole 40 MG tablet Commonly known as:  PROTONIX Take 1 tablet (40 mg total) by mouth daily.   PAZEO 0.7 % Soln Generic drug:  Olopatadine HCl Place 1 drop into both eyes daily.   predniSONE 50 MG tablet Commonly known as:  DELTASONE Take 1 tablet (50 mg total) by mouth daily with breakfast. Start taking on:  02/19/2017   REFRESH OPTIVE ADVANCED 0.5-1-0.5 % Soln Generic drug:  Carboxymeth-Glycerin-Polysorb Apply 1 drop to eye 2 (two) times daily as needed (dry eyes).   sildenafil 100 MG tablet Commonly known as:  VIAGRA Take 0.5 tablets (50 mg total) by mouth daily as needed for erectile dysfunction.   traZODone 100 MG tablet Commonly known as:  DESYREL Take 100 mg by mouth at bedtime.      Follow-up Information    Soyla Dryer, PA-C Follow  up in 1 week(s).   Specialty:  Physician Assistant Contact information: [address] [place] [address] [phone]        Arnoldo Lenis, MD. Call in 2 week(s).   Specialty:  Cardiology Contact information: [place] [address] [phone]          Allergies  Allergen Reactions  . Carvedilol Other (See Comments)    Sinus pause on telemetry >3 seconds. Longest one 9 sec. No AV nodal agent  . Lisinopril Anaphylaxis, Shortness Of Breath and Swelling    Angioedema, required intubation and mechanical ventilation  . Amoxicillin Nausea And Vomiting    Has patient had a PCN reaction causing immediate rash, facial/tongue/throat swelling, SOB or lightheadedness with hypotension: Unknown Has  patient had a PCN reaction causing severe rash involving mucus membranes or skin necrosis: Unknown Has patient had a PCN reaction that required hospitalization Unknown Has patient had a PCN reaction occurring within the last 10 years: No If all of the above answers are "NO", then may proceed with Cephalosporin use.     Consultations:  None   Procedures/Studies: Dg Chest 2 View  Result Date: 02/17/2017 CLINICAL DATA:  51 year old male with shortness of breath and chest pain for 4 days. Possible recent lifting injury. Smoker. EXAM: CHEST  2 VIEW COMPARISON:  12/10/2015, and earlier FINDINGS: Stable lung volumes. Chronic increased pulmonary interstitial markings have not definitely changed since 2017. No superimposed pneumothorax, pleural effusion, or confluent pulmonary opacity. Stable cardiac size and mediastinal contours. Right side aortic arch, and mirror image branching demonstrated by chest CTA 07/09/2015. Visualized tracheal air column is within normal limits. No acute osseous abnormality identified. Negative visible bowel gas pattern. IMPRESSION: 1.  No acute cardiopulmonary abnormality. 2. Chronic pulmonary interstitial changes appear stable since 2017 and might relate to smoking. 3. Situs abnormality again noted in the chest with right side aortic arch and descending aorta with mirror image branching as seen on chest CTA 07/09/2015. Electronically Signed   By: Genevie Ann M.D.   On: 02/17/2017 12:26   Dg Lumbar Spine Complete  Result Date: 02/17/2017 CLINICAL DATA:  51 year old male with shortness of breath and chest pain for 4 days. Possible recent lifting injury. Smoker. EXAM: LUMBAR SPINE - COMPLETE 4+ VIEW COMPARISON:  Lumbar radiographs 09/12/2016. CT Abdomen and Pelvis 04/20/2016. FINDINGS: Hypoplastic ribs designated at T12, with full size ribs at T11 for the purposes of this report. This results in normal lumbar segmentation. Advanced chronic disc and endplate degeneration at L5-S1.  Vacuum disc phenomena there on the 2007 CT. Trace anterolisthesis of L4 on L5, with otherwise straightening of lumbar lordosis. Moderate to severe lower lumbar facet hypertrophy. Stable vertebral height and alignment since 2017. No pars fracture. Visible lower thoracic levels appear intact. Stable and negative sacral ala and SI joints. Chronic right hip arthroplasty. Aortoiliac calcified atherosclerosis. IMPRESSION: 1.  No acute osseous abnormality in the lumbar spine. 2. Advanced chronic degenerative changes of the facets at L4-L5 and L5-S1, and disc and endplates at M5-H8. 3.  Calcified aortic atherosclerosis. Electronically Signed   By: Genevie Ann M.D.   On: 02/17/2017 12:37       Subjective:   Discharge Exam: Vitals:   02/18/17 0200 02/18/17 0445  BP: 138/82 (!) 147/53  Pulse: (!) 57 83  Resp: 20 20  Temp: 97.6 F (36.4 C) 97.7 F (36.5 C)   Vitals:   02/17/17 2200 02/18/17 0200 02/18/17 0445 02/18/17 0802  BP: (!) 145/83 138/82 Marland Kitchen)  147/53   Pulse: 88 (!) 57 83   Resp: 18 20 20    Temp: 97.7 F (36.5 C) 97.6 F (36.4 C) 97.7 F (36.5 C)   TempSrc: Oral Oral Oral   SpO2: 94% 96% 93% 97%  Weight:      Height:        General: Pt is alert, awake, not in acute distress Cardiovascular: RRR, S1/S2 +, no rubs, no gallops Respiratory: CTA bilaterally, no wheezing, no rhonchi Abdominal: Soft, NT, ND, bowel sounds + Extremities: no edema, no cyanosis    The results of significant diagnostics from this hospitalization (including imaging, microbiology, ancillary and laboratory) are listed below for reference.     Microbiology: No results found for this or any previous visit (from the past 240 hour(s)).   Labs: BNP (last 3 results) No results for input(s): BNP in the last 8760 hours. Basic Metabolic Panel:  Recent Labs Lab 02/17/17 1029  NA 136  K 3.6  CL 96*  CO2 32  GLUCOSE 256*  BUN 7  CREATININE 0.75  CALCIUM 8.7*   Liver Function Tests: No results for  input(s): AST, ALT, ALKPHOS, BILITOT, PROT, ALBUMIN in the last 168 hours. No results for input(s): LIPASE, AMYLASE in the last 168 hours. No results for input(s): AMMONIA in the last 168 hours. CBC:  Recent Labs Lab 02/17/17 1029  WBC 8.8  HGB 13.7  HCT 44.6  MCV 84.0  PLT 233   Cardiac Enzymes:  Recent Labs Lab 02/17/17 1030 02/17/17 1517 02/17/17 1911 02/17/17 2154  TROPONINI 0.17* 0.33* 0.27* 0.20*   BNP: Invalid input(s): POCBNP CBG:  Recent Labs Lab 02/17/17 1628 02/17/17 2206 02/18/17 0733  GLUCAP 144* 241* 249*   D-Dimer  Recent Labs  02/17/17 1030  DDIMER 0.43   Hgb A1c No results for input(s): HGBA1C in the last 72 hours. Lipid Profile No results for input(s): CHOL, HDL, LDLCALC, TRIG, CHOLHDL, LDLDIRECT in the last 72 hours. Thyroid function studies No results for input(s): TSH, T4TOTAL, T3FREE, THYROIDAB in the last 72 hours.  Invalid input(s): FREET3 Anemia work up No results for input(s): VITAMINB12, FOLATE, FERRITIN, TIBC, IRON, RETICCTPCT in the last 72 hours. Urinalysis    Component Value Date/Time   COLORURINE AMBER (A) 04/03/2016 0301   APPEARANCEUR CLEAR 04/03/2016 0301   LABSPEC >1.030 (H) 04/03/2016 0301   PHURINE 5.5 04/03/2016 0301   GLUCOSEU NEGATIVE 04/03/2016 0301   HGBUR TRACE (A) 04/03/2016 0301   BILIRUBINUR NEGATIVE 04/03/2016 0301   KETONESUR NEGATIVE 04/03/2016 0301   PROTEINUR TRACE (A) 04/03/2016 0301   UROBILINOGEN 0.2 05/30/2015 1628   NITRITE NEGATIVE 04/03/2016 0301   LEUKOCYTESUR SMALL (A) 04/03/2016 0301   Sepsis Labs Invalid input(s): PROCALCITONIN,  WBC,  LACTICIDVEN Microbiology No results found for this or any previous visit (from the past 240 hour(s)).   Time coordinating discharge: Over 30 minutes  SIGNED:   Damita Lack, MD  [facility] 02/18/2017, 10:15 AM Pager   If 7PM-7AM, please contact night-coverage [email or web address] Password [email or web address]

## 2017-02-18 NOTE — Progress Notes (Signed)
Last night, at time of assessment, Patient stated that external mid chest felt "tender" after Echo. Patient stated that it did not hurt much and did not feel internal. Now, patient states that this feeling is more aching, not external tenderness, and has expanded outward. Patient denies that this is pain but rates aching feeling 5/10. Patient unsure how to describe feeling. Patient stated that this feeling is constant, not coming on strong like chest pain. Patient having intermittent SOB with rest. Troponins already monitored. Patient scheduled for EKG this AM. Will continue to monitor

## 2017-02-18 NOTE — Care Management Note (Signed)
Case Management Note  Patient Details  Name: Randy Hebert MRN: [id number] Date of Birth: April 20, 1966  Subjective/Objective:                  Pt admitted with chest pain, COPD. Chart reviewed for CM needs. PT has nebs pta. Has PCP, transportation and insurance with drug coverage. PT was hypoxic in ED.   Action/Plan: Pt discharging home today with self care. RN ambulating pt to ensure he is no longer hypoxic requiring oxygen. No CM needs anticipated.   Expected Discharge Date:  02/18/17               Expected Discharge Plan:  Home/Self Care  In-House Referral:  NA  Discharge planning Services  CM Consult  Post Acute Care Choice:  NA Choice offered to:  NA  Status of Service:  Completed, signed off  Sherald Barge, RN 02/18/2017, 10:30 AM

## 2017-02-18 NOTE — Progress Notes (Signed)
IV removed, WNL. D/C instructions given to pt and family member. Verbalized understanding. Pt family member at bedside to transport home.

## 2017-02-18 NOTE — Progress Notes (Signed)
Patient noted to have several pauses in heartbeat per tele. Patient noted to have apneic periods at the same time as pauses. Patient easily arouses. Patient states that he has been told that he might have sleep apnea but has not been tested for this. Patient also has history of pauses in heartbeat and "skipping a beat". VS per flow sheet. No distress noted. Will continue to monitor.

## 2017-02-18 NOTE — Progress Notes (Signed)
PROGRESS NOTE    Randy Hebert  YJE:[id number] DOB: [birth year] DOA: 02/17/2017 PCP: Soyla Dryer, PA-C   Brief Narrative:   51 year old male with past medical history of hypertension, diabetes, coronary artery disease, COPD with ongoing tobacco use came to the ER with complaints of shortness of breath. Upon admission he was found to be in COPD exacerbation and his troponins were noted to be mildly elevated which eventually trended down.  Assessment & Plan:   Active Problems:   Essential hypertension   CAD (coronary artery disease)   Acute respiratory failure with hypoxia (HCC)   Cigarette nicotine dependence with nicotine-induced disorder   Chest pain   Type 2 diabetes mellitus without complication, without long-term current use of insulin (HCC)   COPD with acute exacerbation (HCC)   Elevated troponin  Mild exacerbation of moderate persistent COPD -Continue bronchodilators, antibiotics. We'll switch his IV steroids to oral -Supplemental oxygen as needed -Counseled to quit tobacco  -We will ambulate him and see how he is doing  Elevated troponin -No acute EKG changes. Trended down troponins -Likely secondary to demand ischemia -D-dimer is negative -Echocardiogram shows ejection fraction 55% with normal wall motion. 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 branch outpatient  Diabetes type 2 -Insulin sliding scale and Accu-Cheks -Resume metformin upon discharge  Coronary artery disease -Continue statin and aspirin -Not on beta blockers due to bradycardia and not on ACE inhibitor/ARB secondary to history of angioedema per outpatient cardiology note  Hypertension -Continue Norvasc    DVT prophylaxis:  Lovenox subcutaneous  Code Status:  full  Family Communication:   wife present at bedside  Disposition Plan: Discharge today   Consultants:   None   Procedures:   None   Antimicrobials:   Levaquin  started 02/17/2017   Subjective: No complaints this morning. He states he is feeling much better. Remains afebrile   Objective: Vitals:   02/17/17 2200 02/18/17 0200 02/18/17 0445 02/18/17 0802  BP: (!) 145/83 138/82 (!) 147/53   Pulse: 88 (!) 57 83   Resp: 18 20 20    Temp: 97.7 F (36.5 C) 97.6 F (36.4 C) 97.7 F (36.5 C)   TempSrc: Oral Oral Oral   SpO2: 94% 96% 93% 97%  Weight:      Height:        Intake/Output Summary (Last 24 hours) at 02/18/17 0945 Last data filed at 02/18/17 0700  Gross per 24 hour  Intake             1200 ml  Output                0 ml  Net             1200 ml   Filed Weights   02/17/17 1010 02/17/17 1517  Weight: 108.9 kg (240 lb) 108.9 kg (240 lb)    Examination:  General exam: Appears calm and comfortable  Respiratory systemSlight coarse breath sounds CV system: S1 & S2 heard, RRR. No JVD, murmurs, rubs, gallops or clicks. No pedal edema. Gastrointestinal system: Abdomen is nondistended, soft and nontender. No organomegaly or masses felt. Normal bowel sounds heard. Central nervous system: Alert and oriented. No focal neurological deficits. Extremities: Symmetric 5 x 5 power. Skin: No rashes, lesions or ulcers Psychiatry: Judgement and insight appear normal. Mood & affect appropriate.     Data Reviewed:   CBC:  Recent Labs Lab 02/17/17 1029  WBC 8.8  HGB 13.7  HCT 44.6  MCV 84.0  PLT 916   Basic Metabolic Panel:  Recent Labs Lab 02/17/17 1029  NA 136  K 3.6  CL 96*  CO2 32  GLUCOSE 256*  BUN 7  CREATININE 0.75  CALCIUM 8.7*   GFR: Estimated Creatinine Clearance: 123 mL/min (by C-G formula based on SCr of 0.75 mg/dL). Liver Function Tests: No results for input(s): AST, ALT, ALKPHOS, BILITOT, PROT, ALBUMIN in the last 168 hours. No results for input(s): LIPASE, AMYLASE in the last 168 hours. No results for input(s): AMMONIA in the last 168 hours. Coagulation Profile: No results for input(s): INR, PROTIME in the  last 168 hours. Cardiac Enzymes:  Recent Labs Lab 02/17/17 1030 02/17/17 1517 02/17/17 1911 02/17/17 2154  TROPONINI 0.17* 0.33* 0.27* 0.20*   BNP (last 3 results) No results for input(s): PROBNP in the last 8760 hours. HbA1C: No results for input(s): HGBA1C in the last 72 hours. CBG:  Recent Labs Lab 02/17/17 1628 02/17/17 2206 02/18/17 0733  GLUCAP 144* 241* 249*   Lipid Profile: No results for input(s): CHOL, HDL, LDLCALC, TRIG, CHOLHDL, LDLDIRECT in the last 72 hours. Thyroid Function Tests: No results for input(s): TSH, T4TOTAL, FREET4, T3FREE, THYROIDAB in the last 72 hours. Anemia Panel: No results for input(s): VITAMINB12, FOLATE, FERRITIN, TIBC, IRON, RETICCTPCT in the last 72 hours. Sepsis Labs: No results for input(s): PROCALCITON, LATICACIDVEN in the last 168 hours.  No results found for this or any previous visit (from the past 240 hour(s)).       Radiology Studies: Dg Chest 2 View  Result Date: 02/17/2017 CLINICAL DATA:  51 year old male with shortness of breath and chest pain for 4 days. Possible recent lifting injury. Smoker. EXAM: CHEST  2 VIEW COMPARISON:  12/10/2015, and earlier FINDINGS: Stable lung volumes. Chronic increased pulmonary interstitial markings have not definitely changed since 2017. No superimposed pneumothorax, pleural effusion, or confluent pulmonary opacity. Stable cardiac size and mediastinal contours. Right side aortic arch, and mirror image branching demonstrated by chest CTA 07/09/2015. Visualized tracheal air column is within normal limits. No acute osseous abnormality identified. Negative visible bowel gas pattern. IMPRESSION: 1.  No acute cardiopulmonary abnormality. 2. Chronic pulmonary interstitial changes appear stable since 2017 and might relate to smoking. 3. Situs abnormality again noted in the chest with right side aortic arch and descending aorta with mirror image branching as seen on chest CTA 07/09/2015. Electronically  Signed   By: Genevie Ann M.D.   On: 02/17/2017 12:26   Dg Lumbar Spine Complete  Result Date: 02/17/2017 CLINICAL DATA:  51 year old male with shortness of breath and chest pain for 4 days. Possible recent lifting injury. Smoker. EXAM: LUMBAR SPINE - COMPLETE 4+ VIEW COMPARISON:  Lumbar radiographs 09/12/2016. CT Abdomen and Pelvis 04/20/2016. FINDINGS: Hypoplastic ribs designated at T12, with full size ribs at T11 for the purposes of this report. This results in normal lumbar segmentation. Advanced chronic disc and endplate degeneration at L5-S1. Vacuum disc phenomena there on the 2007 CT. Trace anterolisthesis of L4 on L5, with otherwise straightening of lumbar lordosis. Moderate to severe lower lumbar facet hypertrophy. Stable vertebral height and alignment since 2017. No pars fracture. Visible lower thoracic levels appear intact. Stable and negative sacral ala and SI joints. Chronic right hip arthroplasty. Aortoiliac calcified atherosclerosis. IMPRESSION: 1.  No acute osseous abnormality in the lumbar spine. 2. Advanced chronic degenerative changes of the facets at L4-L5 and L5-S1, and disc and endplates at B8-G6. 3.  Calcified aortic atherosclerosis. Electronically Signed   By: Lemmie Evens  [place] Crane M.D.   On: 02/17/2017 12:37        Scheduled Meds: . amLODipine  10 mg Oral Daily  . aspirin EC  325 mg Oral Daily  . atorvastatin  20 mg Oral q1800  . citalopram  10 mg Oral Daily  . cycloSPORINE  1 drop Both Eyes BID  . enoxaparin (LOVENOX) injection  40 mg Subcutaneous Q24H  . guaiFENesin  1,200 mg Oral BID  . insulin aspart  0-15 Units Subcutaneous TID WC  . insulin aspart  0-5 Units Subcutaneous QHS  . ipratropium-albuterol  3 mL Nebulization BID  . isosorbide mononitrate  15 mg Oral Daily  . levofloxacin  500 mg Oral Daily  . methylPREDNISolone (SOLU-MEDROL) injection  60 mg Intravenous Q6H  . mometasone-formoterol  2 puff Inhalation BID  . nicotine  7 mg Transdermal Daily  . olopatadine  1 drop Both  Eyes Daily  . pantoprazole  40 mg Oral Daily  . traZODone  100 mg Oral QHS   Continuous Infusions:   LOS: 0 days    Time spent: 35 mins     Adore Kithcart Arsenio Loader, MD [facility] Pager [phone]   If 7PM-7AM, please contact night-coverage [email or web address] Password [email or web address] 02/18/2017, 9:45 AM

## 2017-03-06 ENCOUNTER — Ambulatory Visit (INDEPENDENT_AMBULATORY_CARE_PROVIDER_SITE_OTHER): Payer: Medicaid Other | Admitting: Adult Health

## 2017-03-06 ENCOUNTER — Encounter: Payer: Self-pay | Admitting: Adult Health

## 2017-03-06 VITALS — BP 130/76 | HR 77 | Ht 65.0 in | Wt 230.0 lb

## 2017-03-06 DIAGNOSIS — F17200 Nicotine dependence, unspecified, uncomplicated: Secondary | ICD-10-CM | POA: Diagnosis not present

## 2017-03-06 DIAGNOSIS — R0602 Shortness of breath: Secondary | ICD-10-CM | POA: Diagnosis not present

## 2017-03-06 DIAGNOSIS — E7800 Pure hypercholesterolemia, unspecified: Secondary | ICD-10-CM

## 2017-03-06 DIAGNOSIS — I10 Essential (primary) hypertension: Secondary | ICD-10-CM | POA: Diagnosis not present

## 2017-03-06 DIAGNOSIS — I2510 Atherosclerotic heart disease of native coronary artery without angina pectoris: Secondary | ICD-10-CM | POA: Diagnosis not present

## 2017-03-06 MED ORDER — MAGNESIUM 200 MG PO TABS: 200.0000 mg | ORAL_TABLET | Freq: Every day | ORAL | 6 refills | Status: DC

## 2017-03-06 MED ORDER — ATORVASTATIN CALCIUM 20 MG PO TABS: 20.0000 mg | ORAL_TABLET | Freq: Every day | ORAL | 4 refills | Status: DC

## 2017-03-06 MED ORDER — GUAIFENESIN ER 600 MG PO TB12: 1200.0000 mg | ORAL_TABLET | Freq: Two times a day (BID) | ORAL | 0 refills | Status: DC | PRN

## 2017-03-06 NOTE — Progress Notes (Signed)
Cardiology Office Note   Date:  03/06/2017   ID:  Randy Hebert, Randy Hebert [birth year], MRN [id number]  PCP:  Raylene Everts, MD  Cardiologist:  [facility]  Chief Complaint  Patient presents with  . Hospitalization Follow-up  . Coronary Artery Disease  . Hypertension      History of Present Illness: Randy Hebert is a 51 y.o. male who presents for hospital follow-up after admission for COPD exacerbation with known history of hypertension, diabetes, coronary artery disease, COPD, and ongoing tobacco abuse. The patient had mildly elevated troponin during hospitalization it was not found to be related to ACS and likely to demand ischemia in the setting of hypoxia.   Echocardiogram revealed an EF of 55% with normal wall motion, increased thickness of the atrial septum consistent with lipomatous hypertrophy. He is not on beta blockers due to bradycardia. She is allergic to ACE inhibitor and ARB due to angioedema.  He is here today without any further cardiac complaints. He is cutting back on his smoking significantly only one or 2 cigarettes a day. He has finished his course of steroids and antibiotics and continues on current medication regimen. He needs refills on atorvastatin. He is having to change primary care providers as his insurance no longer covers prior provider. He is asking for refills on guaifenesin and atorvastatin. He is complaining of muscle aches and cramping.  Past Medical History:  Diagnosis Date  . Anxiety   . Arthritis   . Back pain   . Bulging of cervical intervertebral disc   . CAD (coronary artery disease)    lateral STEMI 02/06/2015 00% D1 occlusion treated with Promus Premier 2.5 mm x 16 mm DES, 70% ramus stenosis, 40% mid RCA stenosis, 45% distal RCA stenosis, EF 45-50%  . CHF (congestive heart failure) ([place])   . COPD (chronic obstructive pulmonary disease) ([place])   . Depression   . Diabetes mellitus without complication ([place])   . Difficult intubation    Possible  secondary to vocal cord injury per patient  . Dry eye   . Dyspnea   . GERD (gastroesophageal reflux disease)   . Headache   . Hip pain   . MI (myocardial infarction) ([place])   . Neck pain   . Otitis media   . Pleurisy   . Sinus pause    9 sec sinus pause on telemetry after started on coreg after MI, avoid AV nodal blocking agent    Past Surgical History:  Procedure Laterality Date  . BIOPSY  10/09/2016   Procedure: BIOPSY;  Surgeon: Daneil Dolin, MD;  [facility];  Service: Endoscopy;;  . CARDIAC CATHETERIZATION N/A 02/06/2015   Procedure: Left Heart Cath and Coronary Angiography;  Surgeon: Leonie Man, MD;  Location: [place] [facility];  Service: Cardiovascular;  Laterality: N/A;  . CARDIAC CATHETERIZATION N/A 02/06/2015   Procedure: Coronary Stent Intervention;  Surgeon: Leonie Man, MD;  Location: [place] [facility];  Service: Cardiovascular;  Laterality: N/A;  . COLONOSCOPY WITH PROPOFOL N/A 10/09/2016   Procedure: COLONOSCOPY WITH PROPOFOL;  Surgeon: Daneil Dolin, MD;  [facility];  Service: Endoscopy;  Laterality: N/A;  12:00 pm  . COLONOSCOPY WITH PROPOFOL N/A 11/24/2016   Procedure: COLONOSCOPY WITH PROPOFOL;  Surgeon: Daneil Dolin, MD;  [facility];  Service: Endoscopy;  Laterality: N/A;  200  . CORONARY ANGIOPLASTY WITH STENT PLACEMENT  01/2015  . ESOPHAGOGASTRODUODENOSCOPY (EGD) WITH PROPOFOL N/A 10/09/2016   Procedure: ESOPHAGOGASTRODUODENOSCOPY (  EGD) WITH PROPOFOL;  Surgeon: Daneil Dolin, MD;  [facility];  Service: Endoscopy;  Laterality: N/A;  . INCISION / DRAINAGE HAND / FINGER    . POLYPECTOMY  11/24/2016   Procedure: POLYPECTOMY;  Surgeon: Daneil Dolin, MD;  [facility];  Service: Endoscopy;;  descending and sigmoid  . TOTAL HIP ARTHROPLASTY Right 07/01/2016  . TOTAL HIP ARTHROPLASTY Right 07/01/2016   Procedure: RIGHT TOTAL HIP ARTHROPLASTY ANTERIOR APPROACH;  Surgeon: Mcarthur Rossetti, MD;   Location: [employer];  Service: Orthopedics;  Laterality: Right;     Current Outpatient Prescriptions  Medication Sig Dispense Refill  . albuterol (PROVENTIL HFA;VENTOLIN HFA) 108 (90 Base) MCG/ACT inhaler Inhale 2 puffs into the lungs every 6 (six) hours as needed for wheezing or shortness of breath. 1 Inhaler 4  . albuterol (PROVENTIL) (2.5 MG/3ML) 0.083% nebulizer solution Take 3 mLs (2.5 mg total) by nebulization every 6 (six) hours as needed for wheezing or shortness of breath. 150 mL 1  . amLODipine (NORVASC) 10 MG tablet Take 1 tablet (10 mg total) by mouth daily. 30 tablet 4  . aspirin EC 81 MG tablet Take 1 tablet (81 mg total) by mouth 2 (two) times daily after a meal. (Patient taking differently: Take 81 mg by mouth daily. ) 60 tablet 0  . atorvastatin (LIPITOR) 20 MG tablet Take 1 tablet (20 mg total) by mouth daily. (Patient taking differently: Take 20 mg by mouth daily at 6 PM. ) 30 tablet 4  . Carboxymeth-Glycerin-Polysorb (REFRESH OPTIVE ADVANCED) 0.5-1-0.5 % SOLN Apply 1 drop to eye 2 (two) times daily as needed (dry eyes).     . citalopram (CELEXA) 10 MG tablet Take 10 mg by mouth daily.    . cycloSPORINE (RESTASIS) 0.05 % ophthalmic emulsion Place 1 drop into both eyes 2 (two) times daily.    Marland Kitchen guaiFENesin (MUCINEX) 600 MG 12 hr tablet Take 2 tablets (1,200 mg total) by mouth 2 (two) times daily as needed. 20 tablet 0  . ibuprofen (ADVIL,MOTRIN) 600 MG tablet Take 1 tablet (600 mg total) by mouth every 6 (six) hours as needed. 30 tablet 0  . isosorbide mononitrate (IMDUR) 30 MG 24 hr tablet Take 0.5 tablets (15 mg total) by mouth daily. 45 tablet 3  . levofloxacin (LEVAQUIN) 500 MG tablet Take 1 tablet (500 mg total) by mouth daily. 5 tablet 0  . Menthol, Topical Analgesic, (BENGAY EX) Apply 1 application topically 2 (two) times daily as needed (for muscle aches/pain.).     Marland Kitchen metFORMIN (GLUCOPHAGE) 1000 MG tablet Take 1 tablet (1,000 mg total) by mouth 2 (two) times daily with a  meal. 60 tablet 4  . methocarbamol (ROBAXIN) 500 MG tablet Take 1 tablet (500 mg total) by mouth 2 (two) times daily. 20 tablet 0  . mometasone-formoterol (DULERA) 100-5 MCG/ACT AERO Inhale 2 puffs into the lungs 2 (two) times daily. 1 Inhaler 4  . nicotine (NICODERM CQ) 7 mg/24hr patch Place 1 patch (7 mg total) onto the skin daily. (Patient taking differently: Place 7 mg onto the skin once a week. ) 28 patch 1  . nitroGLYCERIN (NITROSTAT) 0.4 MG SL tablet Place 1 tablet (0.4 mg total) under the tongue every 5 (five) minutes as needed for chest pain. 25 tablet 3  . Olopatadine HCl (PAZEO) 0.7 % SOLN Place 1 drop into both eyes daily.     . Omega-3 Fatty Acids (FISH OIL) 1200 MG CAPS Take 1,200 mg by mouth 2 (two) times  daily.    . pantoprazole (PROTONIX) 40 MG tablet Take 1 tablet (40 mg total) by mouth daily. 30 tablet 11  . predniSONE (DELTASONE) 50 MG tablet Take 1 tablet (50 mg total) by mouth daily with breakfast. 5 tablet 0  . sildenafil (VIAGRA) 100 MG tablet Take 0.5 tablets (50 mg total) by mouth daily as needed for erectile dysfunction. 4 tablet 11  . traZODone (DESYREL) 100 MG tablet Take 100 mg by mouth at bedtime.      No current facility-administered medications for this visit.     Allergies:   Carvedilol; Lisinopril; and Amoxicillin    Social History:  The patient  reports that he has been smoking Cigarettes.  He started smoking about 37 years ago. He has a 11.50 pack-year smoking history. He has quit using smokeless tobacco. His smokeless tobacco use included Chew. He reports that he drinks about 3.0 - 3.6 oz of alcohol per week . He reports that he does not use drugs.   Family History:  The patient's family history includes Cancer in his mother; Heart attack in his father; Stroke in his father.    ROS: All other systems are reviewed and negative. Unless otherwise mentioned in H&P    PHYSICAL EXAM: VS:  BP 130/76   Pulse 77   Ht 5\' 5"  (1.651 m)   Wt 230 lb (104.3 kg)    SpO2 98%   BMI 38.27 kg/m  , BMI Body mass index is 38.27 kg/m. GEN: Well nourished, well developed, in no acute distress  HEENT: normal  Neck: no JVD, carotid bruits, or masses Cardiac: RRR;Tachycardic, no murmurs, rubs, or gallops,no edema  Respiratory:  clear to auscultation bilaterally, normal work of breathing GI: nontender, nondistended, + BS. Obese. MS: no deformity or atrophy  Skin: warm and dry, no rash Neuro:  Strength and sensation are intact Psych: euthymic mood, full affect    Recent Labs: 11/11/2016: ALT 32 03-06-17: BUN 7; Creatinine, Ser 0.75; Hemoglobin 13.7; Platelets 233; Potassium 3.6; Sodium 136    Lipid Panel    Component Value Date/Time   CHOL 112 11/11/2016 0708   TRIG 214 (H) 11/11/2016 0708   HDL 20 (L) 11/11/2016 0708   CHOLHDL 5.6 (H) 11/11/2016 0708   VLDL 43 (H) 11/11/2016 0708   LDLCALC 49 11/11/2016 0708      Wt Readings from Last 3 Encounters:  03/06/17 230 lb (104.3 kg)  03-06-2017 240 lb (108.9 kg)  02/11/17 239 lb 4 oz (108.5 kg)      Other studies Reviewed: Echocardiogram Mar 06, 2017 Left ventricle: The cavity size was normal. Wall thickness was   increased in a pattern of mild LVH. Systolic function was normal.   The estimated ejection fraction was 55%. Wall motion was normal;   there were no regional wall motion abnormalities. Left   ventricular diastolic function parameters were normal. - Aortic valve: Likely tri leaflet poorly visualized. - Atrial septum: There was increased thickness of the septum,   consistent with lipomatous hypertrophy. No defect or patent   foramen ovale was identified.   ASSESSMENT AND PLAN:  1.  Coronary artery disease: No complaints of chest pain, the patient's troponins were negative during hospitalization. He continues to have issues with breathing, but has improved with smoking cessation. No further cardiac testing planned he will continue secondary prevention. Continue aspirin and isosorbide. He  cannot tolerate ACE inhibitor or ARB, causing angioedema.  2. Hypercholesterolemia: The patient continues on statin therapy. Refills on atorvastatin are provided.  3. COPD: The patient is trying to quit smoking, is coughed up a lot since significantly cutting down. He has finished his course of antibiotics and steroid therapy. I have given him refills on Mucinex. He is to follow-up with primary care and pulmonology.  4. Muscle aches and pains with cramping. He is on a PPI and also statin therapy. I will start him on magnesium 200 mg daily. Doubt myalgias as this is only occurring in his hands on occasion and sometimes his legs at nighttime. Has been using mustard which is been helpful. If he has significant muscle aches and pains which do not improve with use of magnesium may consider adjusting statin dose.   Current medicines are reviewed at length with the patient today.    Labs/ tests ordered today include:  Phill Myron. [place] Pugh, ANP, AACC   03/06/2017 2:07 PM    [facility]. [address], [place], [place] [address] Phone: [phone]; Fax: [phone]

## 2017-03-06 NOTE — Patient Instructions (Signed)
Medication Instructions:  START MAGNESIUM 200 MG DAILY    Labwork: NONE  Testing/Procedures: NONE  Follow-Up: Your physician wants you to follow-up in: 6 MONTHS.  You will receive a reminder letter in the mail two months in advance. If you don't receive a letter, please call our office to schedule the follow-up appointment.   Any Other Special Instructions Will Be Listed Below (If Applicable).     If you need a refill on your cardiac medications before your next appointment, please call your pharmacy.

## 2017-03-09 ENCOUNTER — Other Ambulatory Visit: Payer: Self-pay | Admitting: Physician Assistant

## 2017-03-09 MED ORDER — ATORVASTATIN CALCIUM 20 MG PO TABS: 20.0000 mg | ORAL_TABLET | Freq: Every day | ORAL | 1 refills | Status: DC

## 2017-03-31 ENCOUNTER — Ambulatory Visit: Payer: Medicaid Other | Admitting: Family Medicine

## 2017-04-13 ENCOUNTER — Ambulatory Visit: Payer: Medicaid Other | Admitting: Family Medicine

## 2017-04-15 ENCOUNTER — Ambulatory Visit (INDEPENDENT_AMBULATORY_CARE_PROVIDER_SITE_OTHER): Payer: Medicaid Other | Admitting: Family Medicine

## 2017-04-15 ENCOUNTER — Encounter: Payer: Self-pay | Admitting: Family Medicine

## 2017-04-15 VITALS — BP 130/68 | HR 100 | Temp 98.2°F | Resp 20 | Ht 65.0 in | Wt 233.1 lb

## 2017-04-15 DIAGNOSIS — E785 Hyperlipidemia, unspecified: Secondary | ICD-10-CM

## 2017-04-15 DIAGNOSIS — F17200 Nicotine dependence, unspecified, uncomplicated: Secondary | ICD-10-CM

## 2017-04-15 DIAGNOSIS — I10 Essential (primary) hypertension: Secondary | ICD-10-CM | POA: Diagnosis not present

## 2017-04-15 DIAGNOSIS — E119 Type 2 diabetes mellitus without complications: Secondary | ICD-10-CM

## 2017-04-15 DIAGNOSIS — I2510 Atherosclerotic heart disease of native coronary artery without angina pectoris: Secondary | ICD-10-CM

## 2017-04-15 DIAGNOSIS — K219 Gastro-esophageal reflux disease without esophagitis: Secondary | ICD-10-CM

## 2017-04-15 DIAGNOSIS — F1910 Other psychoactive substance abuse, uncomplicated: Secondary | ICD-10-CM

## 2017-04-15 DIAGNOSIS — Z6838 Body mass index (BMI) 38.0-38.9, adult: Secondary | ICD-10-CM

## 2017-04-15 DIAGNOSIS — F418 Other specified anxiety disorders: Secondary | ICD-10-CM

## 2017-04-15 DIAGNOSIS — I5023 Acute on chronic systolic (congestive) heart failure: Secondary | ICD-10-CM

## 2017-04-15 DIAGNOSIS — E66812 Obesity, class 2: Secondary | ICD-10-CM

## 2017-04-15 DIAGNOSIS — J449 Chronic obstructive pulmonary disease, unspecified: Secondary | ICD-10-CM

## 2017-04-15 LAB — CBC
HEMATOCRIT: 43 % (ref 38.5–50.0)
Hemoglobin: 13.6 g/dL (ref 13.2–17.1)
MCH: 25.6 pg — ABNORMAL LOW (ref 27.0–33.0)
MCHC: 31.6 g/dL — AB (ref 32.0–36.0)
MCV: 80.8 fL (ref 80.0–100.0)
MPV: 9.3 fL (ref 7.5–12.5)
Platelets: 277 10*3/uL (ref 140–400)
RBC: 5.32 MIL/uL (ref 4.20–5.80)
RDW: 16.7 % — AB (ref 11.0–15.0)
WBC: 7.7 10*3/uL (ref 3.8–10.8)

## 2017-04-15 LAB — URINALYSIS, MICROSCOPIC ONLY
Bacteria, UA: NONE SEEN [HPF]
Casts: NONE SEEN [LPF]
Crystals: NONE SEEN [HPF]
RBC / HPF: NONE SEEN RBC/HPF (ref <=2)
SQUAMOUS EPITHELIAL / LPF: NONE SEEN [HPF] (ref <=5)
WBC UA: NONE SEEN WBC/HPF (ref <=5)
Yeast: NONE SEEN [HPF]

## 2017-04-15 LAB — LIPID PANEL
CHOLESTEROL: 134 mg/dL (ref <200)
HDL: 24 mg/dL — AB (ref >40)
LDL CALC: 71 mg/dL (ref <100)
TRIGLYCERIDES: 193 mg/dL — AB (ref <150)
Total CHOL/HDL Ratio: 5.6 Ratio — ABNORMAL HIGH (ref <5.0)
VLDL: 39 mg/dL — ABNORMAL HIGH (ref <30)

## 2017-04-15 LAB — COMPLETE METABOLIC PANEL WITH GFR
ALT: 38 U/L (ref 9–46)
AST: 29 U/L (ref 10–35)
Albumin: 3.7 g/dL (ref 3.6–5.1)
Alkaline Phosphatase: 98 U/L (ref 40–115)
BUN: 7 mg/dL (ref 7–25)
CALCIUM: 8.8 mg/dL (ref 8.6–10.3)
CHLORIDE: 97 mmol/L — AB (ref 98–110)
CO2: 28 mmol/L (ref 20–32)
Creat: 0.76 mg/dL (ref 0.70–1.33)
GFR, Est African American: >89 mL/min (ref >=60)
GFR, Est Non African American: >89 mL/min (ref >=60)
Glucose, Bld: 284 mg/dL — ABNORMAL HIGH (ref 65–99)
POTASSIUM: 4.1 mmol/L (ref 3.5–5.3)
SODIUM: 134 mmol/L — AB (ref 135–146)
Total Bilirubin: 0.6 mg/dL (ref 0.2–1.2)
Total Protein: 6.1 g/dL (ref 6.1–8.1)

## 2017-04-15 LAB — URINALYSIS, ROUTINE W REFLEX MICROSCOPIC
Bilirubin Urine: NEGATIVE
Hgb urine dipstick: NEGATIVE
Ketones, ur: NEGATIVE
LEUKOCYTES UA: NEGATIVE
NITRITE: NEGATIVE
PROTEIN: NEGATIVE
Specific Gravity, Urine: 1.024 (ref 1.001–1.035)
pH: 7 (ref 5.0–8.0)

## 2017-04-15 MED ORDER — NICOTINE 21 MG/24HR TD PT24: 21.0000 mg | MEDICATED_PATCH | Freq: Every day | TRANSDERMAL | 0 refills | Status: DC

## 2017-04-15 MED ORDER — METFORMIN HCL 1000 MG PO TABS: 1000.0000 mg | ORAL_TABLET | Freq: Two times a day (BID) | ORAL | 3 refills | Status: DC

## 2017-04-15 MED ORDER — FUROSEMIDE 20 MG PO TABS: 20.0000 mg | ORAL_TABLET | Freq: Every day | ORAL | 3 refills | Status: DC

## 2017-04-15 MED ORDER — AMLODIPINE BESYLATE 10 MG PO TABS: 10.0000 mg | ORAL_TABLET | Freq: Every day | ORAL | 4 refills | Status: DC

## 2017-04-15 NOTE — Patient Instructions (Signed)
Need to quit smoking Use the nicotine patches - 21  Take the lasix once a day in the morning This will make you pass more water  Need blood work today  See me on Monday If you get worse or have increased chest pain go to the ER

## 2017-04-15 NOTE — Progress Notes (Signed)
Chief Complaint  Patient presents with  . Congestive Heart Failure   New patient Multiple medical problems Well controlled DM Well controlled hyperlipidemia Well controlled hypertension CAD with two heart attacks and stents.  Not well controlled heart disease with current signs and symptoms of heart failure on top of chronic CHF.  Has DOE, dyspnea at rest, increased pedal edema and chest pressure with ambulation.  Started 2 d ago.  Is not on a low salt/daily weight regimen.   I have discussed the multiple health risks associated with cigarette smoking including, but not limited to, cardiovascular disease, lung disease and cancer.  I have strongly recommended that smoking be stopped.  I have reviewed the various methods of quitting including cold [place], classes, nicotine replacements and prescription medications.  I have offered assistance in this difficult process.  This is strongly reinforced.  He is given a Rx for nicotine patches.COPD, is on dulera daily and albuterol prn.  Does not have cough or sputum or wheeze at this time to account for the increased dyspnea.    Patient Active Problem List   Diagnosis Date Noted  . Substance abuse 04/15/2017  . GERD (gastroesophageal reflux disease) 09/23/2016  . 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 Type 2 diabetes mellitus without complication, without long-term current use of insulin ([place]) 04/26/2016  . Chronic obstructive pulmonary disease ([place]) 08/27/2015  . Cigarette nicotine dependence with nicotine-induced disorder 08/27/2015  . Hyperlipidemia 08/27/2015  . Obesity, unspecified 08/27/2015  . Acute on chronic systolic heart failure ([place]) 06/02/2015  . ACE inhibitor-aggravated angioedema   . Chronic systolic CHF (congestive heart failure) ([place]) 06/01/2015  . CAD (coronary artery disease) 05/31/2015  . STEMI (ST elevation myocardial infarction) ([place]) 05/31/2015  . Tobacco use disorder 05/31/2015  . Essential  hypertension 02/09/2015    Outpatient Encounter Prescriptions as of 04/15/2017  Medication Sig  . albuterol (PROVENTIL HFA;VENTOLIN HFA) 108 (90 Base) MCG/ACT inhaler Inhale 2 puffs into the lungs every 6 (six) hours as needed for wheezing or shortness of breath.  Marland Kitchen albuterol (PROVENTIL) (2.5 MG/3ML) 0.083% nebulizer solution Take 3 mLs (2.5 mg total) by nebulization every 6 (six) hours as needed for wheezing or shortness of breath.  Marland Kitchen amLODipine (NORVASC) 10 MG tablet Take 1 tablet (10 mg total) by mouth daily.  Marland Kitchen aspirin EC 81 MG tablet Take 1 tablet (81 mg total) by mouth 2 (two) times daily after a meal. (Patient taking differently: Take 81 mg by mouth daily. )  . atorvastatin (LIPITOR) 20 MG tablet Take 1 tablet (20 mg total) by mouth daily.  . Carboxymeth-Glycerin-Polysorb (REFRESH OPTIVE ADVANCED) 0.5-1-0.5 % SOLN Apply 1 drop to eye 2 (two) times daily as needed (dry eyes).   . citalopram (CELEXA) 20 MG tablet Take 20 mg by mouth daily.  . cycloSPORINE (RESTASIS) 0.05 % ophthalmic emulsion Place 1 drop into both eyes 2 (two) times daily.  Marland Kitchen guaiFENesin (MUCINEX) 600 MG 12 hr tablet Take 2 tablets (1,200 mg total) by mouth 2 (two) times daily as needed.  Marland Kitchen ibuprofen (ADVIL,MOTRIN) 600 MG tablet Take 1 tablet (600 mg total) by mouth every 6 (six) hours as needed.  . isosorbide mononitrate (IMDUR) 30 MG 24 hr tablet Take 0.5 tablets (15 mg total) by mouth daily.  . Magnesium 200 MG TABS Take 1 tablet (200 mg total) by mouth daily.  . metFORMIN (GLUCOPHAGE) 1000 MG tablet Take 1 tablet (1,000 mg total) by mouth 2 (two) times daily with a meal.  .  mometasone-formoterol (DULERA) 100-5 MCG/ACT AERO Inhale 2 puffs into the lungs 2 (two) times daily.  . nitroGLYCERIN (NITROSTAT) 0.4 MG SL tablet Place 1 tablet (0.4 mg total) under the tongue every 5 (five) minutes as needed for chest pain.  . Omega-3 Fatty Acids (FISH OIL) 1200 MG CAPS Take 1,200 mg by mouth 2 (two) times daily.  . pantoprazole  (PROTONIX) 40 MG tablet Take 1 tablet (40 mg total) by mouth daily.  . traZODone (DESYREL) 100 MG tablet Take 100 mg by mouth at bedtime.   . furosemide (LASIX) 20 MG tablet Take 1 tablet (20 mg total) by mouth daily.  . nicotine (NICODERM CQ - DOSED IN MG/24 HOURS) 21 mg/24hr patch Place 1 patch (21 mg total) onto the skin daily.   No facility-administered encounter medications on file as of 04/15/2017.     Past Medical History:  Diagnosis Date  . Allergy   . Anxiety   . Arthritis   . Asthma    hip replacement  . Back pain   . Bulging of cervical intervertebral disc   . CAD (coronary artery disease)    lateral STEMI 02/06/2015 00% D1 occlusion treated with Promus Premier 2.5 mm x 16 mm DES, 70% ramus stenosis, 40% mid RCA stenosis, 45% distal RCA stenosis, EF 45-50%  . CHF (congestive heart failure) ([place])   . COPD (chronic obstructive pulmonary disease) ([place])   . Depression   . Diabetes mellitus without complication ([place])   . 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 Dry eye   . Dyspnea   . Early satiety 09/23/2016  . GERD (gastroesophageal reflux disease)   . Headache   . Heart murmur   . Hip pain   . Hyperlipidemia   . Hypertension   . MI (myocardial infarction) ([place])   . Neck pain   . Otitis media   . Pleurisy   . Sinus pause    9 sec sinus pause on telemetry after started on coreg after MI, avoid AV nodal blocking agent  . Sleep apnea   . Substance abuse    alcoholic  . Unilateral primary osteoarthritis, right hip 07/01/2016    Past Surgical History:  Procedure Laterality Date  . BIOPSY  10/09/2016   Procedure: BIOPSY;  Surgeon: Daneil Dolin, MD;  [facility];  Service: Endoscopy;;  . CARDIAC CATHETERIZATION N/A 02/06/2015   Procedure: Left Heart Cath and Coronary Angiography;  Surgeon: Leonie Man, MD;  Location: [place] [facility];  Service: Cardiovascular;  Laterality: N/A;  . CARDIAC CATHETERIZATION N/A  02/06/2015   Procedure: Coronary Stent Intervention;  Surgeon: Leonie Man, MD;  Location: [place] [facility];  Service: Cardiovascular;  Laterality: N/A;  . COLONOSCOPY WITH PROPOFOL N/A 10/09/2016   Procedure: COLONOSCOPY WITH PROPOFOL;  Surgeon: Daneil Dolin, MD;  [facility];  Service: Endoscopy;  Laterality: N/A;  12:00 pm  . COLONOSCOPY WITH PROPOFOL N/A 11/24/2016   Procedure: COLONOSCOPY WITH PROPOFOL;  Surgeon: Daneil Dolin, MD;  [facility];  Service: Endoscopy;  Laterality: N/A;  200  . CORONARY ANGIOPLASTY WITH STENT PLACEMENT  01/2015  . ESOPHAGOGASTRODUODENOSCOPY (EGD) WITH PROPOFOL N/A 10/09/2016   Procedure: ESOPHAGOGASTRODUODENOSCOPY (EGD) WITH PROPOFOL;  Surgeon: Daneil Dolin, MD;  [facility];  Service: Endoscopy;  Laterality: N/A;  . INCISION / DRAINAGE HAND / FINGER    . POLYPECTOMY  11/24/2016   Procedure: POLYPECTOMY;  Surgeon:  Daneil Dolin, MD;  [facility];  Service: Endoscopy;;  descending and sigmoid  . TOTAL HIP ARTHROPLASTY Right 07/01/2016  . TOTAL HIP ARTHROPLASTY Right 07/01/2016   Procedure: RIGHT TOTAL HIP ARTHROPLASTY ANTERIOR APPROACH;  Surgeon: Mcarthur Rossetti, MD;  Location: [facility];  Service: Orthopedics;  Laterality: Right;    Social History   Social History  . Marital status: Significant Other    Spouse name: alice  . Number of children: 3  . Years of education: 12   Occupational History  . unemployed     Nature conservation officer  .      disability pending   Social History Main Topics  . Smoking status: Current Every Day Smoker    Packs/day: 1.00    Years: 46.00    Types: Cigarettes    Start date: 06/07/1979  . Smokeless tobacco: Former Systems developer    Types: Chew  . Alcohol use 5.4 - 6.0 oz/week    5 - 6 Shots of liquor, 4 Cans of beer per week     Comment: alcoholic, now drinks intermittently  . Drug use: No     Comment: history of drug use- marijuana, cocaine- quit 2014  . Sexual  activity: Yes    Partners: Female    Birth control/ protection: None   Other Topics Concern  . Not on file   Social History Narrative   Lives with girlfriend [place]   Leisure - TV    Family History  Problem Relation Age of Onset  . Heart attack Father   . Stroke Father   . Arthritis Father   . Heart disease Father   . Cancer Mother        ???  . Arthritis Mother   . Heart disease Brother 44       died in sleep  . Early death Brother   . Diabetes Maternal Uncle   . Alzheimer's disease Maternal Grandmother     Review of Systems  Constitutional: Positive for malaise/fatigue. Negative for chills, fever and weight loss.  HENT: Negative for congestion and hearing loss.   Eyes: Negative for blurred vision and pain.  Respiratory: Positive for shortness of breath. Negative for cough, sputum production and wheezing.   Cardiovascular: Positive for chest pain and leg swelling.  Gastrointestinal: Negative for abdominal pain, constipation, diarrhea and heartburn.       No heartburn on PPI  Genitourinary: Negative for dysuria and frequency.  Musculoskeletal: Negative for falls, joint pain and myalgias.  Neurological: Negative for dizziness, seizures and headaches.  Endo/Heme/Allergies: Positive for environmental allergies.  Psychiatric/Behavioral: Positive for depression. The patient is nervous/anxious. The patient does not have insomnia.     BP 130/68 (BP Location: Right Arm, Patient Position: Sitting, Cuff Size: Large)   Pulse 100   Temp 98.2 F (36.8 C) (Temporal)   Resp 20   Ht 5\' 5"  (1.651 m)   Wt 233 lb 1.3 oz (105.7 kg)   SpO2 90%   BMI 38.79 kg/m   Physical Exam  Constitutional: He is oriented to person, place, and time. He appears well-developed and well-nourished.  HENT:  Head: Normocephalic and atraumatic.  Mouth/Throat: Oropharynx is clear and moist.  Both pinna deformed and thickened  Eyes: Pupils are equal, round, and reactive to light. Conjunctivae are  normal.  Neck: Normal range of motion. Neck supple. No thyromegaly present.  Cardiovascular: Normal rate, regular rhythm and normal heart sounds.   Pulmonary/Chest: He is in respiratory distress. He has no  wheezes. He has rales.  Dyspneic at rest 22-24, cannot complete long sentence.  +rales  Abdominal: Soft. Bowel sounds are normal.  Musculoskeletal: Normal range of motion. He exhibits edema.  Lymphadenopathy:    He has no cervical adenopathy.  Neurological: He is alert and oriented to person, place, and time.  Gait normal  Skin: Skin is warm and dry.  Psychiatric: He has a normal mood and affect. His behavior is normal.  Poor judgement  Nursing note and vitals reviewed. ASSESSMENT/PLAN:   1. Class 2 severe obesity due to excess calories with serious comorbidity and body mass index (BMI) of 38.0 to 38.9 in adult [facility]) Will refer to diabetic ed/nutrition once improved  2. Coronary artery disease involving native coronary artery of native heart without angina pectoris  3. Essential hypertension  4. Chronic obstructive pulmonary disease, unspecified COPD type ([place])  5. Type 2 diabetes mellitus without complication, without long-term current use of insulin ([facility]) - CBC - COMPLETE METABOLIC PANEL WITH GFR - Hemoglobin A1c - Lipid panel - Microalbumin / creatinine urine ratio - Urinalysis, Routine w reflex microscopic  6. Gastroesophageal reflux disease, esophagitis presence not specified - VITAMIN D 25 Hydroxy (Vit-D Deficiency, Fractures)  7. Tobacco use disorder Strongly recommend stopping  8. Acute on chronic systolic heart failure (H[facility]) Acute heart failure today.  Will do labs.  Add lasix.  See me in 48 hours - B Nat Peptide  9. Substance abuse Poor judgement - continues to drink and smoke in spite of serious health consequences  10. Hyperlipidemia, unspecified hyperlipidemia type On statin   Patient Instructions  Need to quit smoking Use the nicotine patches -  21  Take the lasix once a day in the morning This will make you pass more water  Need blood work today  See me on Monday If you get worse or have increased chest pain go to the ER     Raylene Everts, MD

## 2017-04-16 ENCOUNTER — Encounter: Payer: Self-pay | Admitting: Family Medicine

## 2017-04-16 LAB — MICROALBUMIN / CREATININE URINE RATIO
Creatinine, Urine: 102 mg/dL (ref 20–370)
MICROALB/CREAT RATIO: 9 ug/mg{creat} (ref <30)
Microalb, Ur: 0.9 mg/dL

## 2017-04-16 LAB — BRAIN NATRIURETIC PEPTIDE: BRAIN NATRIURETIC PEPTIDE: 32.6 pg/mL (ref <100)

## 2017-04-16 LAB — HEMOGLOBIN A1C
HEMOGLOBIN A1C: 7.6 % — AB (ref <5.7)
MEAN PLASMA GLUCOSE: 171 mg/dL

## 2017-04-16 LAB — VITAMIN D 25 HYDROXY (VIT D DEFICIENCY, FRACTURES): Vit D, 25-Hydroxy: 23 ng/mL — ABNORMAL LOW (ref 30–100)

## 2017-04-17 ENCOUNTER — Encounter: Payer: Self-pay | Admitting: Family Medicine

## 2017-04-17 ENCOUNTER — Ambulatory Visit (INDEPENDENT_AMBULATORY_CARE_PROVIDER_SITE_OTHER): Payer: Medicaid Other | Admitting: Family Medicine

## 2017-04-17 VITALS — BP 148/90 | HR 108 | Temp 98.4°F | Resp 24 | Ht 65.0 in | Wt 230.0 lb

## 2017-04-17 DIAGNOSIS — I10 Essential (primary) hypertension: Secondary | ICD-10-CM | POA: Diagnosis not present

## 2017-04-17 DIAGNOSIS — I2510 Atherosclerotic heart disease of native coronary artery without angina pectoris: Secondary | ICD-10-CM

## 2017-04-17 MED ORDER — ATORVASTATIN CALCIUM 20 MG PO TABS: 20.0000 mg | ORAL_TABLET | Freq: Every day | ORAL | 3 refills | Status: DC

## 2017-04-17 NOTE — Progress Notes (Signed)
Chief Complaint  Patient presents with  . Follow-up    48 hours   Patient came as a new visit 2 days ago and appeared to be an early congestive heart failure. He was short of breath, increased pedal edema. He has a known decreased ejection fraction. I did start him on Lasix. In 2 days he's lost 3 pounds. His breathing is better. His swelling is down. He did have blood work at last visit. Interestingly his BNP was normal. He has well-controlled diabetes with hemoglobin A1c of 76 .  he is on a statin and his LDL is 71. He has coronary artery disease, goal is 70 or below. Liver and kidney functions were normal. Blood count was good. No other abnormalities identified. He has not had any chest pain. Dyspnea is improved. No cough sputum or signs of respiratory infection.  Known COPD. He is using his nicotine patches. He is reduce his cigarettes. He is down to 4 cigarettes a day. He is congratulated on his effort.  Patient Active Problem List   Diagnosis Date Noted  . Substance abuse 04/15/2017  . Depression with anxiety 04/15/2017  . GERD (gastroesophageal reflux disease) 09/23/2016  . 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 Type 2 diabetes mellitus without complication, without long-term current use of insulin ([place]) 04/26/2016  . Chronic obstructive pulmonary disease ([place]) 08/27/2015  . Cigarette nicotine dependence with nicotine-induced disorder 08/27/2015  . Hyperlipidemia 08/27/2015  . Obesity, unspecified 08/27/2015  . Acute on chronic systolic heart failure ([place]) 06/02/2015  . ACE inhibitor-aggravated angioedema   . Chronic systolic CHF (congestive heart failure) ([place]) 06/01/2015  . CAD (coronary artery disease) 05/31/2015  . STEMI (ST elevation myocardial infarction) ([place]) 05/31/2015  . Tobacco use disorder 05/31/2015  . Essential hypertension 02/09/2015    Outpatient Encounter Prescriptions as of 04/17/2017  Medication Sig  . albuterol (PROVENTIL HFA;VENTOLIN  HFA) 108 (90 Base) MCG/ACT inhaler Inhale 2 puffs into the lungs every 6 (six) hours as needed for wheezing or shortness of breath.  Marland Kitchen albuterol (PROVENTIL) (2.5 MG/3ML) 0.083% nebulizer solution Take 3 mLs (2.5 mg total) by nebulization every 6 (six) hours as needed for wheezing or shortness of breath.  Marland Kitchen amLODipine (NORVASC) 10 MG tablet Take 1 tablet (10 mg total) by mouth daily.  Marland Kitchen aspirin EC 81 MG tablet Take 1 tablet (81 mg total) by mouth 2 (two) times daily after a meal. (Patient taking differently: Take 81 mg by mouth daily. )  . atorvastatin (LIPITOR) 20 MG tablet Take 1 tablet (20 mg total) by mouth daily.  . Carboxymeth-Glycerin-Polysorb (REFRESH OPTIVE ADVANCED) 0.5-1-0.5 % SOLN Apply 1 drop to eye 2 (two) times daily as needed (dry eyes).   . citalopram (CELEXA) 10 MG tablet Take 10 mg by mouth daily.  . cycloSPORINE (RESTASIS) 0.05 % ophthalmic emulsion Place 1 drop into both eyes 2 (two) times daily.  . furosemide (LASIX) 20 MG tablet Take 1 tablet (20 mg total) by mouth daily.  Marland Kitchen guaiFENesin (MUCINEX) 600 MG 12 hr tablet Take 2 tablets (1,200 mg total) by mouth 2 (two) times daily as needed.  Marland Kitchen ibuprofen (ADVIL,MOTRIN) 600 MG tablet Take 1 tablet (600 mg total) by mouth every 6 (six) hours as needed.  . isosorbide mononitrate (IMDUR) 30 MG 24 hr tablet Take 0.5 tablets (15 mg total) by mouth daily.  . Magnesium 200 MG TABS Take 1 tablet (200 mg total) by mouth daily.  . metFORMIN (GLUCOPHAGE) 1000 MG tablet Take  1 tablet (1,000 mg total) by mouth 2 (two) times daily with a meal.  . mometasone-formoterol (DULERA) 100-5 MCG/ACT AERO Inhale 2 puffs into the lungs 2 (two) times daily.  . nicotine (NICODERM CQ - DOSED IN MG/24 HOURS) 21 mg/24hr patch Place 1 patch (21 mg total) onto the skin daily.  . nitroGLYCERIN (NITROSTAT) 0.4 MG SL tablet Place 1 tablet (0.4 mg total) under the tongue every 5 (five) minutes as needed for chest pain.  . Omega-3 Fatty Acids (FISH OIL) 1200 MG CAPS  Take 1,200 mg by mouth 2 (two) times daily.  . pantoprazole (PROTONIX) 40 MG tablet Take 1 tablet (40 mg total) by mouth daily.  . traZODone (DESYREL) 100 MG tablet Take 100 mg by mouth at bedtime.    No facility-administered encounter medications on file as of 04/17/2017.     Allergies  Allergen Reactions  . Carvedilol Other (See Comments)    Sinus pause on telemetry >3 seconds. Longest one 9 sec. No AV nodal agent  . Lisinopril Anaphylaxis, Shortness Of Breath and Swelling    Angioedema, required intubation and mechanical ventilation  . Amoxicillin Nausea And Vomiting    Nausea only - no allergy     Review of Systems  Constitutional: Positive for fatigue and unexpected weight change.       Down 3 pounds  Respiratory: Positive for shortness of breath. Negative for wheezing.        Improved  Cardiovascular: Positive for leg swelling. Negative for chest pain and palpitations.       Improved  Genitourinary: Positive for frequency.       From Lasix  All other systems reviewed and are negative.   BP (!) 148/90 (BP Location: Left Arm, Patient Position: Sitting, Cuff Size: Normal)   Pulse (!) 108   Temp 98.4 F (36.9 C) (Temporal)   Resp (!) 24   Ht 5\' 5"  (1.651 m)   Wt 230 lb (104.3 kg)   SpO2 91%   BMI 38.27 kg/m   Physical Exam  Constitutional: He appears well-developed and well-nourished. No distress.  Cardiovascular: Normal rate, regular rhythm and normal heart sounds.   Pulmonary/Chest: Effort normal. He has rales.  Persistent rales in bases  Musculoskeletal: He exhibits edema.  1+ pedal edema  Neurological: He is alert.  Psychiatric: He has a normal mood and affect. His behavior is normal. Thought content normal.    ASSESSMENT/PLAN:  1. Coronary artery disease involving native coronary artery of native heart without angina pectoris Chronic congestive heart failure. Improved on Lasix.  2. Essential hypertension Controlled  Patient Instructions  Good work  reducing the cigarettes keep trying to quit Stay on the lasix Need labs in 2 weeks and see me the day after  Please call Dr [name] to be seen sooner than November   Raylene Everts, MD

## 2017-04-17 NOTE — Patient Instructions (Addendum)
Good work reducing the cigarettes keep trying to quit Stay on the lasix Need labs in 2 weeks and see me the day after  Please call Dr [name] to be seen sooner than November

## 2017-04-26 ENCOUNTER — Inpatient Hospital Stay (HOSPITAL_COMMUNITY)
Admission: EM | Admit: 2017-04-26 | Discharge: 2017-04-28 | DRG: 247 | Disposition: A | Discharge status: Home / Self Care | Payer: Medicaid Other | Attending: Cardiology | Admitting: Cardiology

## 2017-04-26 ENCOUNTER — Encounter (HOSPITAL_COMMUNITY): Payer: Self-pay

## 2017-04-26 ENCOUNTER — Inpatient Hospital Stay: Admission: AD | Admit: 2017-04-26 | Payer: Self-pay | Source: Other Acute Inpatient Hospital | Admitting: Cardiology

## 2017-04-26 ENCOUNTER — Emergency Department (HOSPITAL_COMMUNITY): Payer: Medicaid Other

## 2017-04-26 DIAGNOSIS — Z955 Presence of coronary angioplasty implant and graft: Secondary | ICD-10-CM | POA: Diagnosis not present

## 2017-04-26 DIAGNOSIS — I255 Ischemic cardiomyopathy: Secondary | ICD-10-CM | POA: Diagnosis not present

## 2017-04-26 DIAGNOSIS — Z833 Family history of diabetes mellitus: Secondary | ICD-10-CM

## 2017-04-26 DIAGNOSIS — G4730 Sleep apnea, unspecified: Secondary | ICD-10-CM | POA: Diagnosis present

## 2017-04-26 DIAGNOSIS — I10 Essential (primary) hypertension: Secondary | ICD-10-CM | POA: Diagnosis present

## 2017-04-26 DIAGNOSIS — I4510 Unspecified right bundle-branch block: Secondary | ICD-10-CM | POA: Diagnosis present

## 2017-04-26 DIAGNOSIS — Z7984 Long term (current) use of oral hypoglycemic drugs: Secondary | ICD-10-CM | POA: Diagnosis not present

## 2017-04-26 DIAGNOSIS — I5022 Chronic systolic (congestive) heart failure: Secondary | ICD-10-CM | POA: Diagnosis present

## 2017-04-26 DIAGNOSIS — Z8249 Family history of ischemic heart disease and other diseases of the circulatory system: Secondary | ICD-10-CM | POA: Diagnosis not present

## 2017-04-26 DIAGNOSIS — R001 Bradycardia, unspecified: Secondary | ICD-10-CM

## 2017-04-26 DIAGNOSIS — R079 Chest pain, unspecified: Secondary | ICD-10-CM | POA: Diagnosis present

## 2017-04-26 DIAGNOSIS — Z880 Allergy status to penicillin: Secondary | ICD-10-CM | POA: Diagnosis not present

## 2017-04-26 DIAGNOSIS — I110 Hypertensive heart disease with heart failure: Secondary | ICD-10-CM | POA: Diagnosis present

## 2017-04-26 DIAGNOSIS — F17210 Nicotine dependence, cigarettes, uncomplicated: Secondary | ICD-10-CM | POA: Diagnosis present

## 2017-04-26 DIAGNOSIS — Z888 Allergy status to other drugs, medicaments and biological substances status: Secondary | ICD-10-CM

## 2017-04-26 DIAGNOSIS — E785 Hyperlipidemia, unspecified: Secondary | ICD-10-CM | POA: Diagnosis not present

## 2017-04-26 DIAGNOSIS — Z6838 Body mass index (BMI) 38.0-38.9, adult: Secondary | ICD-10-CM

## 2017-04-26 DIAGNOSIS — F17200 Nicotine dependence, unspecified, uncomplicated: Secondary | ICD-10-CM | POA: Diagnosis present

## 2017-04-26 DIAGNOSIS — I214 Non-ST elevation (NSTEMI) myocardial infarction: Principal | ICD-10-CM | POA: Diagnosis present

## 2017-04-26 DIAGNOSIS — I2510 Atherosclerotic heart disease of native coronary artery without angina pectoris: Secondary | ICD-10-CM | POA: Diagnosis present

## 2017-04-26 DIAGNOSIS — Z823 Family history of stroke: Secondary | ICD-10-CM | POA: Diagnosis not present

## 2017-04-26 DIAGNOSIS — F329 Major depressive disorder, single episode, unspecified: Secondary | ICD-10-CM | POA: Diagnosis not present

## 2017-04-26 DIAGNOSIS — G4733 Obstructive sleep apnea (adult) (pediatric): Secondary | ICD-10-CM

## 2017-04-26 DIAGNOSIS — J449 Chronic obstructive pulmonary disease, unspecified: Secondary | ICD-10-CM | POA: Diagnosis not present

## 2017-04-26 DIAGNOSIS — I252 Old myocardial infarction: Secondary | ICD-10-CM | POA: Diagnosis not present

## 2017-04-26 DIAGNOSIS — Z7982 Long term (current) use of aspirin: Secondary | ICD-10-CM

## 2017-04-26 DIAGNOSIS — Z79899 Other long term (current) drug therapy: Secondary | ICD-10-CM

## 2017-04-26 DIAGNOSIS — E782 Mixed hyperlipidemia: Secondary | ICD-10-CM | POA: Diagnosis present

## 2017-04-26 DIAGNOSIS — E119 Type 2 diabetes mellitus without complications: Secondary | ICD-10-CM | POA: Diagnosis present

## 2017-04-26 DIAGNOSIS — K219 Gastro-esophageal reflux disease without esophagitis: Secondary | ICD-10-CM | POA: Diagnosis present

## 2017-04-26 HISTORY — DX: Non-ST elevation (NSTEMI) myocardial infarction: I21.4

## 2017-04-26 HISTORY — DX: Acute myocardial infarction, unspecified: I21.9

## 2017-04-26 LAB — CBC
HCT: 43.7 % (ref 39.0–52.0)
Hemoglobin: 13.9 g/dL (ref 13.0–17.0)
MCH: 25.7 pg — ABNORMAL LOW (ref 26.0–34.0)
MCHC: 31.8 g/dL (ref 30.0–36.0)
MCV: 80.8 fL (ref 78.0–100.0)
Platelets: 261 10*3/uL (ref 150–400)
RBC: 5.41 MIL/uL (ref 4.22–5.81)
RDW: 16.3 % — AB (ref 11.5–15.5)
WBC: 8.9 10*3/uL (ref 4.0–10.5)

## 2017-04-26 LAB — TROPONIN I
TROPONIN I: 1.9 ng/mL — AB (ref <0.03)
Troponin I: 0.12 ng/mL (ref <0.03)

## 2017-04-26 LAB — HEPARIN LEVEL (UNFRACTIONATED): Heparin Unfractionated: <0.1 IU/mL — ABNORMAL LOW (ref 0.30–0.70)

## 2017-04-26 LAB — BASIC METABOLIC PANEL
Anion gap: 11 (ref 5–15)
BUN: 7 mg/dL (ref 6–20)
CALCIUM: 8.8 mg/dL — AB (ref 8.9–10.3)
CO2: 25 mmol/L (ref 22–32)
CREATININE: 0.93 mg/dL (ref 0.61–1.24)
Chloride: 100 mmol/L — ABNORMAL LOW (ref 101–111)
GFR calc Af Amer: >60 mL/min (ref >60)
GLUCOSE: 241 mg/dL — AB (ref 65–99)
Potassium: 3.6 mmol/L (ref 3.5–5.1)
Sodium: 136 mmol/L (ref 135–145)

## 2017-04-26 LAB — TSH: TSH: 1.635 u[IU]/mL (ref 0.350–4.500)

## 2017-04-26 LAB — I-STAT TROPONIN, ED: TROPONIN I, POC: 0.04 ng/mL (ref 0.00–0.08)

## 2017-04-26 LAB — PROTIME-INR
INR: 1.03
PROTHROMBIN TIME: 13.5 s (ref 11.4–15.2)

## 2017-04-26 LAB — GLUCOSE, CAPILLARY: Glucose-Capillary: 178 mg/dL — ABNORMAL HIGH (ref 65–99)

## 2017-04-26 MED ORDER — SODIUM CHLORIDE 0.9 % WEIGHT BASED INFUSION
3.0000 mL/kg/h | INTRAVENOUS | Status: DC
  Administered 2017-04-27: 3 mL/kg/h via INTRAVENOUS

## 2017-04-26 MED ORDER — GUAIFENESIN ER 600 MG PO TB12: 1200.0000 mg | ORAL_TABLET | Freq: Two times a day (BID) | ORAL | Status: DC | PRN

## 2017-04-26 MED ORDER — OMEGA-3-ACID ETHYL ESTERS 1 G PO CAPS
1.0000 g | ORAL_CAPSULE | Freq: Two times a day (BID) | ORAL | Status: DC
  Administered 2017-04-26 – 2017-04-28 (×3): 1 g via ORAL
  Filled 2017-04-26 (×3): qty 1

## 2017-04-26 MED ORDER — SODIUM CHLORIDE 0.9% FLUSH: 3.0000 mL | INTRAVENOUS | Status: DC | PRN

## 2017-04-26 MED ORDER — HEPARIN (PORCINE) IN NACL 100-0.45 UNIT/ML-% IJ SOLN
1450.0000 [IU]/h | INTRAMUSCULAR | Status: DC
  Administered 2017-04-26: 1000 [IU]/h via INTRAVENOUS
  Administered 2017-04-27: 1250 [IU]/h via INTRAVENOUS
  Filled 2017-04-26 (×2): qty 250

## 2017-04-26 MED ORDER — ZOLPIDEM TARTRATE 5 MG PO TABS: 5.0000 mg | ORAL_TABLET | Freq: Every evening | ORAL | Status: DC | PRN

## 2017-04-26 MED ORDER — ASPIRIN 81 MG PO CHEW
81.0000 mg | CHEWABLE_TABLET | ORAL | Status: AC
  Administered 2017-04-27: 81 mg via ORAL
  Filled 2017-04-26: qty 1

## 2017-04-26 MED ORDER — INSULIN ASPART 100 UNIT/ML ~~LOC~~ SOLN: 0.0000 [IU] | Freq: Every day | SUBCUTANEOUS | Status: DC

## 2017-04-26 MED ORDER — ALPRAZOLAM 0.25 MG PO TABS
0.2500 mg | ORAL_TABLET | Freq: Two times a day (BID) | ORAL | Status: DC | PRN
  Administered 2017-04-26 – 2017-04-27 (×2): 0.25 mg via ORAL
  Filled 2017-04-26 (×2): qty 1

## 2017-04-26 MED ORDER — ONDANSETRON HCL 4 MG/2ML IJ SOLN: 4.0000 mg | Freq: Four times a day (QID) | INTRAMUSCULAR | Status: DC | PRN

## 2017-04-26 MED ORDER — MAGNESIUM OXIDE 400 (241.3 MG) MG PO TABS
200.0000 mg | ORAL_TABLET | Freq: Every day | ORAL | Status: DC
  Administered 2017-04-28: 200 mg via ORAL
  Filled 2017-04-26: qty 1

## 2017-04-26 MED ORDER — ASPIRIN EC 81 MG PO TBEC
81.0000 mg | DELAYED_RELEASE_TABLET | Freq: Every day | ORAL | Status: DC
  Administered 2017-04-28: 10:00:00 81 mg via ORAL
  Filled 2017-04-26: qty 1

## 2017-04-26 MED ORDER — MORPHINE SULFATE (PF) 4 MG/ML IV SOLN
4.0000 mg | Freq: Once | INTRAVENOUS | Status: AC
  Administered 2017-04-26: 4 mg via INTRAVENOUS
  Filled 2017-04-26: qty 1

## 2017-04-26 MED ORDER — HEPARIN BOLUS VIA INFUSION
2600.0000 [IU] | Freq: Once | INTRAVENOUS | Status: AC
  Administered 2017-04-26: 2600 [IU] via INTRAVENOUS
  Filled 2017-04-26: qty 2600

## 2017-04-26 MED ORDER — CYCLOSPORINE 0.05 % OP EMUL
1.0000 [drp] | Freq: Two times a day (BID) | OPHTHALMIC | Status: DC
  Administered 2017-04-26 – 2017-04-27 (×2): 1 [drp] via OPHTHALMIC
  Filled 2017-04-26 (×5): qty 1

## 2017-04-26 MED ORDER — SODIUM CHLORIDE 0.9 % WEIGHT BASED INFUSION: 1.0000 mL/kg/h | INTRAVENOUS | Status: DC

## 2017-04-26 MED ORDER — POLYVINYL ALCOHOL 1.4 % OP SOLN
1.0000 [drp] | OPHTHALMIC | Status: DC | PRN
  Administered 2017-04-27: 22:00:00 1 [drp] via OPHTHALMIC
  Filled 2017-04-26: qty 15

## 2017-04-26 MED ORDER — TRAZODONE HCL 50 MG PO TABS
100.0000 mg | ORAL_TABLET | Freq: Every day | ORAL | Status: DC
  Administered 2017-04-27: 100 mg via ORAL
  Filled 2017-04-26: qty 2
  Filled 2017-04-26: qty 1

## 2017-04-26 MED ORDER — NITROGLYCERIN IN D5W 200-5 MCG/ML-% IV SOLN
5.0000 ug/min | Freq: Once | INTRAVENOUS | Status: AC
  Administered 2017-04-26: 5 ug/min via INTRAVENOUS
  Filled 2017-04-26: qty 250

## 2017-04-26 MED ORDER — NITROGLYCERIN 2 % TD OINT
1.0000 [in_us] | TOPICAL_OINTMENT | Freq: Once | TRANSDERMAL | Status: AC
  Administered 2017-04-26: 1 [in_us] via TOPICAL
  Filled 2017-04-26: qty 1

## 2017-04-26 MED ORDER — NITROGLYCERIN 0.4 MG SL SUBL
0.4000 mg | SUBLINGUAL_TABLET | SUBLINGUAL | Status: DC | PRN
  Administered 2017-04-26: 0.4 mg via SUBLINGUAL
  Filled 2017-04-26: qty 1

## 2017-04-26 MED ORDER — SODIUM CHLORIDE 0.9 % IV SOLN: 250.0000 mL | INTRAVENOUS | Status: DC | PRN

## 2017-04-26 MED ORDER — NITROGLYCERIN IN D5W 200-5 MCG/ML-% IV SOLN
5.0000 ug/min | INTRAVENOUS | Status: DC
  Administered 2017-04-26: 66.667 ug/min via INTRAVENOUS
  Administered 2017-04-27: 100 ug/min via INTRAVENOUS
  Filled 2017-04-26: qty 250

## 2017-04-26 MED ORDER — ISOSORBIDE MONONITRATE ER 30 MG PO TB24: 15.0000 mg | ORAL_TABLET | Freq: Every day | ORAL | Status: DC

## 2017-04-26 MED ORDER — MOMETASONE FURO-FORMOTEROL FUM 100-5 MCG/ACT IN AERO
2.0000 | INHALATION_SPRAY | Freq: Two times a day (BID) | RESPIRATORY_TRACT | Status: DC
  Administered 2017-04-26 – 2017-04-28 (×4): 2 via RESPIRATORY_TRACT
  Filled 2017-04-26: qty 8.8

## 2017-04-26 MED ORDER — CARBOXYMETH-GLYCERIN-POLYSORB 0.5-1-0.5 % OP SOLN: 1.0000 [drp] | Freq: Two times a day (BID) | OPHTHALMIC | Status: DC | PRN

## 2017-04-26 MED ORDER — CITALOPRAM HYDROBROMIDE 20 MG PO TABS
10.0000 mg | ORAL_TABLET | Freq: Every day | ORAL | Status: DC
  Administered 2017-04-28: 10 mg via ORAL
  Filled 2017-04-26: qty 1

## 2017-04-26 MED ORDER — INSULIN ASPART 100 UNIT/ML ~~LOC~~ SOLN
0.0000 [IU] | Freq: Three times a day (TID) | SUBCUTANEOUS | Status: DC
  Administered 2017-04-27: 2 [IU] via SUBCUTANEOUS
  Administered 2017-04-27: 18:00:00 3 [IU] via SUBCUTANEOUS
  Administered 2017-04-28 (×2): 2 [IU] via SUBCUTANEOUS

## 2017-04-26 MED ORDER — ALBUTEROL SULFATE HFA 108 (90 BASE) MCG/ACT IN AERS: 2.0000 | INHALATION_SPRAY | Freq: Four times a day (QID) | RESPIRATORY_TRACT | Status: DC | PRN

## 2017-04-26 MED ORDER — HEPARIN BOLUS VIA INFUSION
4000.0000 [IU] | Freq: Once | INTRAVENOUS | Status: AC
  Administered 2017-04-26: 4000 [IU] via INTRAVENOUS

## 2017-04-26 MED ORDER — ATORVASTATIN CALCIUM 80 MG PO TABS
80.0000 mg | ORAL_TABLET | Freq: Every day | ORAL | Status: DC
  Administered 2017-04-26 – 2017-04-27 (×2): 80 mg via ORAL
  Filled 2017-04-26 (×2): qty 1

## 2017-04-26 MED ORDER — SODIUM CHLORIDE 0.9% FLUSH: 3.0000 mL | Freq: Two times a day (BID) | INTRAVENOUS | Status: DC

## 2017-04-26 MED ORDER — ALBUTEROL SULFATE (2.5 MG/3ML) 0.083% IN NEBU
2.5000 mg | INHALATION_SOLUTION | Freq: Four times a day (QID) | RESPIRATORY_TRACT | Status: DC | PRN
  Administered 2017-04-27: 12:00:00 2.5 mg via RESPIRATORY_TRACT
  Filled 2017-04-26: qty 3

## 2017-04-26 MED ORDER — FUROSEMIDE 20 MG PO TABS
20.0000 mg | ORAL_TABLET | Freq: Every day | ORAL | Status: DC
  Administered 2017-04-26 – 2017-04-28 (×2): 20 mg via ORAL
  Filled 2017-04-26 (×2): qty 1

## 2017-04-26 MED ORDER — NICOTINE 21 MG/24HR TD PT24
21.0000 mg | MEDICATED_PATCH | Freq: Once | TRANSDERMAL | Status: AC
  Administered 2017-04-26: 21 mg via TRANSDERMAL
  Filled 2017-04-26 (×2): qty 1

## 2017-04-26 MED ORDER — ACETAMINOPHEN 325 MG PO TABS
650.0000 mg | ORAL_TABLET | Freq: Once | ORAL | Status: AC
  Administered 2017-04-26: 650 mg via ORAL
  Filled 2017-04-26: qty 2

## 2017-04-26 MED ORDER — ACETAMINOPHEN 325 MG PO TABS
650.0000 mg | ORAL_TABLET | ORAL | Status: DC | PRN
  Administered 2017-04-26 – 2017-04-28 (×3): 650 mg via ORAL
  Filled 2017-04-26 (×4): qty 2

## 2017-04-26 MED ORDER — NITROGLYCERIN 0.4 MG SL SUBL: 0.4000 mg | SUBLINGUAL_TABLET | SUBLINGUAL | Status: DC | PRN

## 2017-04-26 MED ORDER — MAGNESIUM 200 MG PO TABS: 200.0000 mg | ORAL_TABLET | Freq: Every day | ORAL | Status: DC

## 2017-04-26 MED ORDER — PANTOPRAZOLE SODIUM 40 MG PO TBEC
40.0000 mg | DELAYED_RELEASE_TABLET | Freq: Every day | ORAL | Status: DC
  Administered 2017-04-28: 40 mg via ORAL
  Filled 2017-04-26: qty 1

## 2017-04-26 MED ORDER — MORPHINE SULFATE (PF) 2 MG/ML IV SOLN
2.0000 mg | INTRAVENOUS | Status: DC | PRN
  Administered 2017-04-26 – 2017-04-27 (×3): 2 mg via INTRAVENOUS
  Administered 2017-04-27: 4 mg via INTRAVENOUS
  Filled 2017-04-26: qty 2
  Filled 2017-04-26 (×3): qty 1

## 2017-04-26 MED ORDER — AMLODIPINE BESYLATE 10 MG PO TABS: 10.0000 mg | ORAL_TABLET | Freq: Every day | ORAL | Status: DC

## 2017-04-26 NOTE — ED Notes (Signed)
CRITICAL VALUE ALERT  Critical Value:  Troponin = 0.12  Date & Time Notied:  04/26/2017   1308  Provider Notified: Roderic Palau  Orders Received/Actions taken:

## 2017-04-26 NOTE — ED Provider Notes (Signed)
[facility] DEPT Provider Note   CSN: [id number] Arrival date & time: 04/26/17  7893     History   Chief Complaint Chief Complaint  Patient presents with  . Chest Pain    HPI MELQUISEDEC Hebert is a 51 y.o. male.  Patient states that around 9:00 AM  He started to have some chest discomfort and shortness of breath. Patient still has discomfort but states is getting some better   The history is provided by the patient. No language interpreter was used.  Chest Pain   This is a recurrent problem. The current episode started 3 to 5 hours ago. The problem occurs constantly. The problem has been gradually improving. The pain is associated with rest. The pain is present in the substernal region. The pain is at a severity of 7/10. The pain is moderate. The quality of the pain is described as dull. The pain does not radiate. Pertinent negatives include no abdominal pain, no back pain, no cough and no headaches.  Pertinent negatives for past medical history include no seizures.    Past Medical History:  Diagnosis Date  . Allergy   . Anxiety   . Arthritis   . Asthma    hip replacement  . Back pain   . Bulging of cervical intervertebral disc   . CAD (coronary artery disease)    lateral STEMI 02/06/2015 00% D1 occlusion treated with Promus Premier 2.5 mm x 16 mm DES, 70% ramus stenosis, 40% mid RCA stenosis, 45% distal RCA stenosis, EF 45-50%  . CHF (congestive heart failure) ([place])   . COPD (chronic obstructive pulmonary disease) ([place])   . Depression   . Diabetes mellitus without complication ([place])   . 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 Dry eye   . Dyspnea   . Early satiety 09/23/2016  . GERD (gastroesophageal reflux disease)   . Headache   . Heart murmur   . Hip pain   . Hyperlipidemia   . Hypertension   . MI (myocardial infarction) ([place])   . Myocardial infarction ([place])   . Neck pain   . Otitis media   . Pleurisy   . Sinus pause      9 sec sinus pause on telemetry after started on coreg after MI, avoid AV nodal blocking agent  . Sleep apnea   . Substance abuse    alcoholic  . Unilateral primary osteoarthritis, right hip 07/01/2016    Patient Active Problem List   Diagnosis Date Noted  . Substance abuse 04/15/2017  . Depression with anxiety 04/15/2017  . GERD (gastroesophageal reflux disease) 09/23/2016  . 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 Type 2 diabetes mellitus without complication, without long-term current use of insulin ([place]) 04/26/2016  . Chronic obstructive pulmonary disease ([place]) 08/27/2015  . Cigarette nicotine dependence with nicotine-induced disorder 08/27/2015  . Hyperlipidemia 08/27/2015  . Obesity, unspecified 08/27/2015  . Acute on chronic systolic heart failure ([place]) 06/02/2015  . ACE inhibitor-aggravated angioedema   . Chronic systolic CHF (congestive heart failure) ([place]) 06/01/2015  . CAD (coronary artery disease) 05/31/2015  . STEMI (ST elevation myocardial infarction) ([place]) 05/31/2015  . Tobacco use disorder 05/31/2015  . Essential hypertension 02/09/2015    Past Surgical History:  Procedure Laterality Date  . BIOPSY  10/09/2016   Procedure: BIOPSY;  Surgeon: Daneil Dolin, MD;  [facility];  Service: Endoscopy;;  . CARDIAC CATHETERIZATION N/A 02/06/2015   Procedure:  Left Heart Cath and Coronary Angiography;  Surgeon: Leonie Man, MD;  Location: [facility];  Service: Cardiovascular;  Laterality: N/A;  . CARDIAC CATHETERIZATION N/A 02/06/2015   Procedure: Coronary Stent Intervention;  Surgeon: Leonie Man, MD;  Location: [facility];  Service: Cardiovascular;  Laterality: N/A;  . COLONOSCOPY WITH PROPOFOL N/A 10/09/2016   Procedure: COLONOSCOPY WITH PROPOFOL;  Surgeon: Daneil Dolin, MD;  [facility];  Service: Endoscopy;  Laterality: N/A;  12:00 pm  . COLONOSCOPY WITH PROPOFOL N/A 11/24/2016   Procedure: COLONOSCOPY WITH  PROPOFOL;  Surgeon: Daneil Dolin, MD;  [facility];  Service: Endoscopy;  Laterality: N/A;  200  . CORONARY ANGIOPLASTY WITH STENT PLACEMENT  01/2015  . ESOPHAGOGASTRODUODENOSCOPY (EGD) WITH PROPOFOL N/A 10/09/2016   Procedure: ESOPHAGOGASTRODUODENOSCOPY (EGD) WITH PROPOFOL;  Surgeon: Daneil Dolin, MD;  [facility];  Service: Endoscopy;  Laterality: N/A;  . INCISION / DRAINAGE HAND / FINGER    . POLYPECTOMY  11/24/2016   Procedure: POLYPECTOMY;  Surgeon: Daneil Dolin, MD;  [facility];  Service: Endoscopy;;  descending and sigmoid  . TOTAL HIP ARTHROPLASTY Right 07/01/2016  . TOTAL HIP ARTHROPLASTY Right 07/01/2016   Procedure: RIGHT TOTAL HIP ARTHROPLASTY ANTERIOR APPROACH;  Surgeon: Mcarthur Rossetti, MD;  Location: [facility];  Service: Orthopedics;  Laterality: Right;       Home Medications    Prior to Admission medications   Medication Sig Start Date End Date Taking? Authorizing Provider  albuterol (PROVENTIL HFA;VENTOLIN HFA) 108 (90 Base) MCG/ACT inhaler Inhale 2 puffs into the lungs every 6 (six) hours as needed for wheezing or shortness of breath. 04/24/16  Yes Soyla Dryer, PA-C  albuterol (PROVENTIL) (2.5 MG/3ML) 0.083% nebulizer solution Take 3 mLs (2.5 mg total) by nebulization every 6 (six) hours as needed for wheezing or shortness of breath. 05/27/16  Yes Soyla Dryer, PA-C  amLODipine (NORVASC) 10 MG tablet Take 1 tablet (10 mg total) by mouth daily. 04/15/17  Yes Raylene Everts, MD  aspirin EC 81 MG tablet Take 1 tablet (81 mg total) by mouth 2 (two) times daily after a meal. Patient taking differently: Take 81 mg by mouth daily.  07/03/16  Yes Mcarthur Rossetti, MD  atorvastatin (LIPITOR) 20 MG tablet Take 1 tablet (20 mg total) by mouth daily. 04/17/17  Yes Raylene Everts, MD  Carboxymeth-Glycerin-Polysorb (REFRESH OPTIVE ADVANCED) 0.5-1-0.5 % SOLN Apply 1 drop to eye 2 (two) times daily as needed (dry eyes).    Yes  [provider]  citalopram (CELEXA) 10 MG tablet Take 10 mg by mouth daily.   Yes [provider]  cycloSPORINE (RESTASIS) 0.05 % ophthalmic emulsion Place 1 drop into both eyes 2 (two) times daily.   Yes [provider]  furosemide (LASIX) 20 MG tablet Take 1 tablet (20 mg total) by mouth daily. 04/15/17  Yes Raylene Everts, MD  guaiFENesin (MUCINEX) 600 MG 12 hr tablet Take 2 tablets (1,200 mg total) by mouth 2 (two) times daily as needed. 03/06/17  Yes Lendon Colonel, NP  ibuprofen (ADVIL,MOTRIN) 600 MG tablet Take 1 tablet (600 mg total) by mouth every 6 (six) hours as needed. 12/22/16  Yes Shary Decamp, PA-C  isosorbide mononitrate (IMDUR) 30 MG 24 hr tablet Take 0.5 tablets (15 mg total) by mouth daily. 10/06/16  Yes BranchAlphonse Guild, MD  Magnesium 200 MG TABS Take 1 tablet (200 mg total) by mouth daily. 03/06/17  Yes Lendon Colonel, NP  metFORMIN (GLUCOPHAGE) 1000 MG tablet Take 1 tablet (1,000 mg total) by mouth 2 (two) times daily with a meal. 04/15/17  Yes Raylene Everts, MD  mometasone-formoterol [facility]) 100-5 MCG/ACT AERO Inhale 2 puffs into the lungs 2 (two) times daily. 02/11/17  Yes Soyla Dryer, PA-C  nicotine (NICODERM CQ - DOSED IN MG/24 HOURS) 21 mg/24hr patch Place 1 patch (21 mg total) onto the skin daily. 04/15/17  Yes Raylene Everts, MD  nitroGLYCERIN (NITROSTAT) 0.4 MG SL tablet Place 1 tablet (0.4 mg total) under the tongue every 5 (five) minutes as needed for chest pain. 02/09/15  Yes Meng, Isaac Laud, PA  Omega-3 Fatty Acids (FISH OIL) 1200 MG CAPS Take 1,200 mg by mouth 2 (two) times daily.   Yes [provider]  pantoprazole (PROTONIX) 40 MG tablet Take 1 tablet (40 mg total) by mouth daily. 10/13/16  Yes Rourk, Cristopher Estimable, MD  traZODone (DESYREL) 100 MG tablet Take 100 mg by mouth at bedtime.    Yes [provider]    Family History Family History  Problem Relation Age of Onset  . Heart attack Father   . Stroke  Father   . Arthritis Father   . Heart disease Father   . Cancer Mother        ???  . Arthritis Mother   . Heart disease Brother 79       died in sleep  . Early death Brother   . Diabetes Maternal Uncle   . Alzheimer's disease Maternal Grandmother     Social History Social History  Substance Use Topics  . Smoking status: Current Every Day Smoker    Packs/day: 1.00    Years: 46.00    Types: Cigarettes    Start date: 06/07/1979  . Smokeless tobacco: Former Systems developer    Types: Chew  . Alcohol use 6.6 - 7.2 oz/week    5 - 6 Shots of liquor, 6 Cans of beer per week     Comment: alcoholic, now drinks intermittently     Allergies   Carvedilol; Lisinopril; and Amoxicillin   Review of Systems Review of Systems  Constitutional: Negative for appetite change and fatigue.  HENT: Negative for congestion, ear discharge and sinus pressure.   Eyes: Negative for discharge.  Respiratory: Negative for cough.   Cardiovascular: Positive for chest pain.  Gastrointestinal: Negative for abdominal pain and diarrhea.  Genitourinary: Negative for frequency and hematuria.  Musculoskeletal: Negative for back pain.  Skin: Negative for rash.  Neurological: Negative for seizures and headaches.  Psychiatric/Behavioral: Negative for hallucinations.     Physical Exam Updated Vital Signs BP (!) 148/92   Pulse 87   Temp 99 F (37.2 C) (Oral)   Resp (!) 25   Wt 104.3 kg (230 lb)   SpO2 93%   BMI 38.27 kg/m   Physical Exam  Constitutional: He is oriented to person, place, and time. He appears well-developed.  HENT:  Head: Normocephalic.  Eyes: Conjunctivae and EOM are normal. No scleral icterus.  Neck: Neck supple. No thyromegaly present.  Cardiovascular: Normal rate and regular rhythm.  Exam reveals no gallop and no friction rub.   No murmur heard. Pulmonary/Chest: No stridor. He has no wheezes. He has no rales. He exhibits no tenderness.  Abdominal: He exhibits no distension. There is no  tenderness. There is no rebound.  Musculoskeletal: Normal range of motion. He exhibits no edema.  Lymphadenopathy:    He has no cervical adenopathy.  Neurological: He is oriented to person, place, and time. He exhibits normal muscle tone. Coordination normal.  Skin: No rash noted. No erythema.  Psychiatric: He has a normal mood and affect. His behavior is normal.     ED Treatments / Results  Labs (all labs ordered are listed, but only abnormal results are displayed) Labs Reviewed  BASIC METABOLIC PANEL - Abnormal; Notable for the following:       Result Value   Chloride 100 (*)    Glucose, Bld 241 (*)    Calcium 8.8 (*)    All other components within normal limits  CBC - Abnormal; Notable for the following:    MCH 25.7 (*)    RDW 16.3 (*)    All other components within normal limits  TROPONIN I - Abnormal; Notable for the following:    Troponin I 0.12 (*)    All other components within normal limits  I-STAT TROPONIN, ED    EKG  EKG Interpretation None       Radiology Dg Chest 2 View  Result Date: 04/26/2017 CLINICAL DATA:  Chest pain and shortness of breath. EXAM: CHEST  2 VIEW COMPARISON:  02/17/2017. FINDINGS: Cardiomegaly. No active infiltrates or failure. No effusion or pneumothorax. Chronic pulmonary interstitial changes, similar to priors. RIGHT-sided aortic arch. Bones unremarkable. IMPRESSION: Cardiomegaly, no active disease. Electronically Signed   By: Staci Righter M.D.   On: 04/26/2017 10:05    Procedures Procedures (including critical care time)  Medications Ordered in ED Medications  nitroGLYCERIN (NITROSTAT) SL tablet 0.4 mg (0.4 mg Sublingual Given 04/26/17 1001)  heparin bolus via infusion 4,000 Units (not administered)  heparin ADULT infusion 100 units/mL (25000 units/261mL sodium chloride 0.45%) (not administered)  morphine 4 MG/ML injection 4 mg (4 mg Intravenous Given 04/26/17 1001)  acetaminophen (TYLENOL) tablet 650 mg (650 mg Oral Given 04/26/17  1058)  morphine 4 MG/ML injection 4 mg (4 mg Intravenous Given 04/26/17 1057)  nitroGLYCERIN (NITROGLYN) 2 % ointment 1 inch (1 inch Topical Given 04/26/17 1059)  nitroGLYCERIN 50 mg in dextrose 5 % 250 mL (0.2 mg/mL) infusion (5 mcg/min Intravenous New Bag/Given 04/26/17 1329)     Initial Impression / Assessment and Plan / ED Course  I have reviewed the triage vital signs and the nursing notes.  Pertinent labs & imaging results that were available during my care of the patient were reviewed by me and considered in my medical decision making (see chart for details). CRITICAL CARE Performed by: Zakyah Yanes L Total critical care time:40 minutes Critical care time was exclusive of separately billable procedures and treating other patients. Critical care was necessary to treat or prevent imminent or life-threatening deterioration. Critical care was time spent personally by me on the following activities: development of treatment plan with patient and/or surrogate as well as nursing, discussions with consultants, evaluation of patient's response to treatment, examination of patient, obtaining history from patient or surrogate, ordering and performing treatments and interventions, ordering and review of laboratory studies, ordering and review of radiographic studies, pulse oximetry and re-evaluation of patient's condition.     Patient's pain improved with nitroglycerin and morphine. Patient's second troponin was elevated. His EKG did not show any acute changes. Patient will be admitted to [facility] by cardiology  Final Clinical Impressions(s) / ED Diagnoses   Final diagnoses:  NSTEMI (non-ST elevated myocardial infarction) [facility])    New Prescriptions New Prescriptions   No medications on file     Milton Ferguson, MD 04/26/17 1357

## 2017-04-26 NOTE — Progress Notes (Signed)
CRITICAL VALUE ALERT  Critical Value:  Trop 1.9  Date & Time Notied:  1945  Provider Notified: 1945 Cardz Fellow Text paged  Orders Received/Actions taken: Awaiting call back, will continue to monitor.

## 2017-04-26 NOTE — ED Notes (Signed)
Date and time results received: 04/26/17 1307   Test: Troponin Critical Value: 0.12  Name of Provider Notified: Dr. Roderic Palau  Orders Received? Or Actions Taken?: EDP reported to assess pt pain and obtain repeat EKG. Primary RN aware.

## 2017-04-26 NOTE — ED Triage Notes (Addendum)
Pt reports he was sitting on his porch and became dizzy and chest began hurting along with SOB this morning. Pt reports taking nitro x 1 without relief.

## 2017-04-26 NOTE — Progress Notes (Signed)
ANTICOAGULATION CONSULT NOTE - Initial Consult  Pharmacy Consult for Heparin Indication: chest pain/ACS  Allergies  Allergen Reactions  . Carvedilol Other (See Comments)    Sinus pause on telemetry >3 seconds. Longest one 9 sec. No AV nodal agent  . Lisinopril Anaphylaxis, Shortness Of Breath and Swelling    Angioedema, required intubation and mechanical ventilation  . Amoxicillin Nausea And Vomiting    Nausea only - no allergy     Patient Measurements: Height: 5\' 6"  (167.6 cm) Weight: 228 lb 11.2 oz (103.7 kg) IBW/kg (Calculated) : 63.8 Heparin Dosing Weight:  87 kg  Vital Signs: Temp: 98.9 F (37.2 C) (08/26 1920) Temp Source: Oral (08/26 1920) BP: 118/71 (08/26 1920) Pulse Rate: 79 (08/26 2000)  Labs:  Recent Labs  04/26/17 0933 04/26/17 1221 04/26/17 1751 04/26/17 1940  HGB 13.9  --   --   --   HCT 43.7  --   --   --   PLT 261  --   --   --   LABPROT  --   --  13.5  --   INR  --   --  1.03  --   HEPARINUNFRC  --   --   --  <0.10*  CREATININE 0.93  --   --   --   TROPONINI  --  0.12* 1.90*  --     Estimated Creatinine Clearance: 104.9 mL/min (by C-G formula based on SCr of 0.93 mg/dL).   Medical History: Past Medical History:  Diagnosis Date  . Allergy   . Anxiety   . Arthritis   . Asthma    hip replacement  . Back pain   . Bulging of cervical intervertebral disc   . CAD (coronary artery disease)    lateral STEMI 02/06/2015 00% D1 occlusion treated with Promus Premier 2.5 mm x 16 mm DES, 70% ramus stenosis, 40% mid RCA stenosis, 45% distal RCA stenosis, EF 45-50%  . CHF (congestive heart failure) ([place])   . COPD (chronic obstructive pulmonary disease) ([place])   . Depression   . Diabetes mellitus without complication ([place])   . 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 Dry eye   . Dyspnea   . Early satiety 09/23/2016  . GERD (gastroesophageal reflux disease)   . Headache   . Heart murmur   . Hip pain   .  Hyperlipidemia   . Hypertension   . MI (myocardial infarction) ([place])   . Myocardial infarction ([place])   . Neck pain   . Otitis media   . Pleurisy   . Sinus pause    9 sec sinus pause on telemetry after started on coreg after MI, avoid AV nodal blocking agent  . Sleep apnea   . Substance abuse    alcoholic  . Unilateral primary osteoarthritis, right hip 07/01/2016   Assessment: CC/HPI: 51 yo m presenting to [facility] with CP - started on hep gtt prior to transfer  PMH: CAD, CHF, DM, COPD, HLD, HTN, morbid obesity, tobacco, OSA, Echo 6/18 with EF 55%   Anticoag: none pta on hep gtt for ACS. Baseline CBC WNL. IV heparin was started by MD at [facility] PM HL <0.1   Goal of Therapy:  Heparin level 0.3-0.7 units/ml Monitor platelets by anticoagulation protocol: Yes   Plan:  Heparin 2600 unit bolus Increase heparin infusion to 1250 units/hr Heparin level and CBC daily.  Treyvone Chelf S. Alford Highland, PharmD, [place] Clinical Staff Pharmacist Pager [phone]  Alford Highland, [employer] 04/26/2017,9:29 PM

## 2017-04-26 NOTE — H&P (Signed)
-   Cardiology History and Physical Note  Primary Cardiologist:  Branch  Reason for Admission: NSTEM   HPI:     Randy Hebert is a 51 y.o. male with morbid obesity, COPD with ongoing tobacco use, HTN, DM2, HL, undiagnosed OSA and CAD s/p lateral STEMI in 6/16 with PCI/DES to first diagonal who is transferred from [facility] with ACS/possible NSTEMI.  Admitted in 6/16 with lateral STEMI. Cath:  1. 1st Diag lesion, 100% stenosed. A Promus Premier 2.5 mm x 16 mm drug-eluting stent was placed. There is a 0% residual stenosis post intervention. 2. Ramus lesion, 70% stenosed. 3. Mid RCA lesion, 40% stenosed. Dist RCA lesion, 45% stenosed. 4. Mild to moderately reduced LVEF with anterolateral and apical hypokinesis and elevated LVEDP   Admitted for COPD exacerbation in 6/18. Troponin peaked at 0.33 but felt not to be ACS. Treated medically. Echo 6/18 with EF 55% no RWMA,  He is not on beta blockers due to bradycardia. He is allergic to ACE inhibitor and ARB due to angioedema.  Says he was in his usually state of health this am when he was sitting on the porch and went to light a cigarette. Developed pain middle of his chest. Pain was coming and going so went to ED. In ED ECG with NSR and changes RBBB. No ST-T changes. Trop 0.12. Given ASA. Started on heparin and IV NTG. Says pain comes and goes but was better when he was lying on his side. + nausea at times. Chronic dyspnea.   Smokes 1PPD. Snores heavily but says sleep study hasn't been approved. No bleeding recently.    Review of Systems: [y] = yes, [ ]  = no   General: Weight gain [ ] ; Weight loss [ ] ; Anorexia [ ] ; Fatigue [ y]; Fever [ ] ; Chills [ ] ; Weakness [ ]   Cardiac: Chest pain/pressure Blue.Reese ]; Resting SOB Blue.Reese ]; Exertional SOB Blue.Reese ]; Orthopnea [ ] ; Pedal Edema [ ] ; Palpitations [ ] ; Syncope [ ] ; Presyncope [ ] ; Paroxysmal nocturnal dyspnea[ ]   Pulmonary: Cough [ y]; [facility] ]; Hemoptysis[ ] ; Sputum [ ] ; Snoring [ y]  GI: Nausea Blue.Reese  ]; Dysphagia[ ] ; Melena[ ] ; Hematochezia [ ] ; Heartburn[ ] ; Abdominal pain [ ] ; Constipation [ ] ; Diarrhea [ ] ; BRBPR [ ]   GU: Hematuria[ ] ; Dysuria [ ] ; Nocturia[ ]   Vascular: Pain in legs with walking [ ] ; Pain in feet with lying flat [ ] ; Non-healing sores [ ] ; Stroke [ ] ; TIA [ ] ; Slurred speech [ ] ;  Neuro: Headaches[ ] ; Vertigo[ ] ; Seizures[ ] ; Paresthesias[ ] ;Blurred vision [ ] ; Diplopia [ ] ; Vision changes [ ]   Ortho/Skin: Arthritis [ y]; Joint pain [ y]; Muscle pain [ ] ; Joint swelling [ ] ; Back Pain [ ] ; Rash [ ]   Psych: Depression[ ] ; Anxiety[ ]   Heme: Bleeding problems [ ] ; Clotting disorders [ ] ; Anemia [ ]   Endocrine: Diabetes [ y]; Thyroid dysfunction[ ]    Home Medications Prior to Admission medications   Medication Sig Start Date End Date Taking? Authorizing Provider  albuterol (PROVENTIL HFA;VENTOLIN HFA) 108 (90 Base) MCG/ACT inhaler Inhale 2 puffs into the lungs every 6 (six) hours as needed for wheezing or shortness of breath. 04/24/16  Yes Soyla Dryer, PA-C  albuterol (PROVENTIL) (2.5 MG/3ML) 0.083% nebulizer solution Take 3 mLs (2.5 mg total) by nebulization every 6 (six) hours as needed for wheezing or shortness of breath. 05/27/16  Yes Soyla Dryer, PA-C  amLODipine (NORVASC) 10 MG tablet Take 1 tablet (  10 mg total) by mouth daily. 04/15/17  Yes Raylene Everts, MD  aspirin EC 81 MG tablet Take 1 tablet (81 mg total) by mouth 2 (two) times daily after a meal. Patient taking differently: Take 81 mg by mouth daily.  07/03/16  Yes Mcarthur Rossetti, MD  atorvastatin (LIPITOR) 20 MG tablet Take 1 tablet (20 mg total) by mouth daily. 04/17/17  Yes Raylene Everts, MD  Carboxymeth-Glycerin-Polysorb (REFRESH OPTIVE ADVANCED) 0.5-1-0.5 % SOLN Apply 1 drop to eye 2 (two) times daily as needed (dry eyes).    Yes [provider]  citalopram (CELEXA) 10 MG tablet Take 10 mg by mouth daily.   Yes [provider]  cycloSPORINE (RESTASIS) 0.05 %  ophthalmic emulsion Place 1 drop into both eyes 2 (two) times daily.   Yes [provider]  furosemide (LASIX) 20 MG tablet Take 1 tablet (20 mg total) by mouth daily. 04/15/17  Yes Raylene Everts, MD  guaiFENesin (MUCINEX) 600 MG 12 hr tablet Take 2 tablets (1,200 mg total) by mouth 2 (two) times daily as needed. 03/06/17  Yes Lendon Colonel, NP  ibuprofen (ADVIL,MOTRIN) 600 MG tablet Take 1 tablet (600 mg total) by mouth every 6 (six) hours as needed. 12/22/16  Yes Shary Decamp, PA-C  isosorbide mononitrate (IMDUR) 30 MG 24 hr tablet Take 0.5 tablets (15 mg total) by mouth daily. 10/06/16  Yes BranchAlphonse Guild, MD  Magnesium 200 MG TABS Take 1 tablet (200 mg total) by mouth daily. 03/06/17  Yes Lendon Colonel, NP  metFORMIN (GLUCOPHAGE) 1000 MG tablet Take 1 tablet (1,000 mg total) by mouth 2 (two) times daily with a meal. 04/15/17  Yes Raylene Everts, MD  mometasone-formoterol [facility]) 100-5 MCG/ACT AERO Inhale 2 puffs into the lungs 2 (two) times daily. 02/11/17  Yes Soyla Dryer, PA-C  nicotine (NICODERM CQ - DOSED IN MG/24 HOURS) 21 mg/24hr patch Place 1 patch (21 mg total) onto the skin daily. 04/15/17  Yes Raylene Everts, MD  nitroGLYCERIN (NITROSTAT) 0.4 MG SL tablet Place 1 tablet (0.4 mg total) under the tongue every 5 (five) minutes as needed for chest pain. 02/09/15  Yes Meng, Isaac Laud, PA  Omega-3 Fatty Acids (FISH OIL) 1200 MG CAPS Take 1,200 mg by mouth 2 (two) times daily.   Yes [provider]  pantoprazole (PROTONIX) 40 MG tablet Take 1 tablet (40 mg total) by mouth daily. 10/13/16  Yes Rourk, Cristopher Estimable, MD  traZODone (DESYREL) 100 MG tablet Take 100 mg by mouth at bedtime.    Yes [provider]    Past Medical History: Past Medical History:  Diagnosis Date  . Allergy   . Anxiety   . Arthritis   . Asthma    hip replacement  . Back pain   . Bulging of cervical intervertebral disc   . CAD (coronary artery disease)    lateral STEMI  02/06/2015 00% D1 occlusion treated with Promus Premier 2.5 mm x 16 mm DES, 70% ramus stenosis, 40% mid RCA stenosis, 45% distal RCA stenosis, EF 45-50%  . CHF (congestive heart failure) ([place])   . COPD (chronic obstructive pulmonary disease) ([place])   . Depression   . Diabetes mellitus without complication ([place])   . 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 Dry eye   . Dyspnea   . Early satiety 09/23/2016  . GERD (gastroesophageal reflux disease)   . Headache   . Heart murmur   .  Hip pain   . Hyperlipidemia   . Hypertension   . MI (myocardial infarction) ([place])   . Myocardial infarction ([place])   . Neck pain   . Otitis media   . Pleurisy   . Sinus pause    9 sec sinus pause on telemetry after started on coreg after MI, avoid AV nodal blocking agent  . Sleep apnea   . Substance abuse    alcoholic  . Unilateral primary osteoarthritis, right hip 07/01/2016    Past Surgical History: Past Surgical History:  Procedure Laterality Date  . BIOPSY  10/09/2016   Procedure: BIOPSY;  Surgeon: Daneil Dolin, MD;  [facility];  Service: Endoscopy;;  . CARDIAC CATHETERIZATION N/A 02/06/2015   Procedure: Left Heart Cath and Coronary Angiography;  Surgeon: Leonie Man, MD;  Location: [place] [facility];  Service: Cardiovascular;  Laterality: N/A;  . CARDIAC CATHETERIZATION N/A 02/06/2015   Procedure: Coronary Stent Intervention;  Surgeon: Leonie Man, MD;  Location: [place] [facility];  Service: Cardiovascular;  Laterality: N/A;  . COLONOSCOPY WITH PROPOFOL N/A 10/09/2016   Procedure: COLONOSCOPY WITH PROPOFOL;  Surgeon: Daneil Dolin, MD;  [facility];  Service: Endoscopy;  Laterality: N/A;  12:00 pm  . COLONOSCOPY WITH PROPOFOL N/A 11/24/2016   Procedure: COLONOSCOPY WITH PROPOFOL;  Surgeon: Daneil Dolin, MD;  [facility];  Service: Endoscopy;  Laterality: N/A;  200  . CORONARY ANGIOPLASTY WITH STENT PLACEMENT  01/2015  .  ESOPHAGOGASTRODUODENOSCOPY (EGD) WITH PROPOFOL N/A 10/09/2016   Procedure: ESOPHAGOGASTRODUODENOSCOPY (EGD) WITH PROPOFOL;  Surgeon: Daneil Dolin, MD;  [facility];  Service: Endoscopy;  Laterality: N/A;  . INCISION / DRAINAGE HAND / FINGER    . POLYPECTOMY  11/24/2016   Procedure: POLYPECTOMY;  Surgeon: Daneil Dolin, MD;  [facility];  Service: Endoscopy;;  descending and sigmoid  . TOTAL HIP ARTHROPLASTY Right 07/01/2016  . TOTAL HIP ARTHROPLASTY Right 07/01/2016   Procedure: RIGHT TOTAL HIP ARTHROPLASTY ANTERIOR APPROACH;  Surgeon: Mcarthur Rossetti, MD;  Location: [place];  Service: Orthopedics;  Laterality: Right;    Family History:  Family History  Problem Relation Age of Onset  . Heart attack Father   . Stroke Father   . Arthritis Father   . Heart disease Father   . Cancer Mother        ???  . Arthritis Mother   . Heart disease Brother 83       died in sleep  . Early death Brother   . Diabetes Maternal Uncle   . Alzheimer's disease Maternal Grandmother     Social History: Social History   Social History  . Marital status: Significant Other    Spouse name: alice  . Number of children: 3  . Years of education: 12   Occupational History  . unemployed     Nature conservation officer  .      disability pending   Social History Main Topics  . Smoking status: Current Every Day Smoker    Packs/day: 1.00    Years: 46.00    Types: Cigarettes    Start date: 06/07/1979  . Smokeless tobacco: Former Systems developer    Types: Chew  . Alcohol use 6.6 - 7.2 oz/week    5 - 6 Shots of liquor, 6 Cans of beer per week     Comment: alcoholic, now drinks intermittently  . Drug use: No     Comment: history of drug  use- marijuana, cocaine- quit 2014  . Sexual activity: Yes    Partners: Female    Birth control/ protection: None   Other Topics Concern  . None   Social History Narrative   Lives with girlfriend [name]   Leisure - TV    Allergies:  Allergies   Allergen Reactions  . Carvedilol Other (See Comments)    Sinus pause on telemetry >3 seconds. Longest one 9 sec. No AV nodal agent  . Lisinopril Anaphylaxis, Shortness Of Breath and Swelling    Angioedema, required intubation and mechanical ventilation  . Amoxicillin Nausea And Vomiting    Nausea only - no allergy     Objective:    Vital Signs:   Temp:  [98.1 F (36.7 C)-99 F (37.2 C)] 98.4 F (36.9 C) (08/26 1643) Pulse Rate:  [83-106] 85 (08/26 1643) Resp:  [17-27] 19 (08/26 1643) BP: (126-148)/(71-92) 135/90 (08/26 1643) SpO2:  [90 %-95 %] 94 % (08/26 1506) Weight:  [103.7 kg (228 lb 11.2 oz)-104.3 kg (230 lb)] 103.7 kg (228 lb 11.2 oz) (08/26 1643)   Filed Weights   04/26/17 0931 04/26/17 1643  Weight: 104.3 kg (230 lb) 103.7 kg (228 lb 11.2 oz)     Physical Exam     General:  Morbidly obese male. Chronically ill appearing. Sitting up on side of bed. HEENT: Normal Neck: Thick. Unable to see JVP.Marland Kitchen Carotids 2+ bilat; no bruits. No lymphadenopathy or thyromegaly appreciated. Cor: PMI nonpalpable. Regular rate & rhythm. No rubs, gallops or murmurs. Lungs: Clear with decreased BS throughout. No wheezing  Abdomen: Morbidly obese. Soft, nontender, nondistended. No hepatosplenomegaly. No bruits or masses. Good bowel sounds. Extremities: No cyanosis, clubbing, rash, edema Neuro: Alert & oriented x 3, cranial nerves grossly intact. moves all 4 extremities w/o difficulty. Affect pleasant.   Telemetry   NSR Personally reviewed   EKG   Sinus rhythm with RBBB (old). No ST-T wave abnormalities. Personally reviewed   Labs     Basic Metabolic Panel:  Recent Labs Lab 04/26/17 0933  NA 136  K 3.6  CL 100*  CO2 25  GLUCOSE 241*  BUN 7  CREATININE 0.93  CALCIUM 8.8*    Liver Function Tests: No results for input(s): AST, ALT, ALKPHOS, BILITOT, PROT, ALBUMIN in the last 168 hours. No results for input(s): LIPASE, AMYLASE in the last 168 hours. No results for  input(s): AMMONIA in the last 168 hours.  CBC:  Recent Labs Lab 04/26/17 0933  WBC 8.9  HGB 13.9  HCT 43.7  MCV 80.8  PLT 261    Cardiac Enzymes:  Recent Labs Lab 04/26/17 1221  TROPONINI 0.12*    BNP: BNP (last 3 results)  Recent Labs  04/15/17 0927  BNP 32.6    ProBNP (last 3 results) No results for input(s): PROBNP in the last 8760 hours.   CBG: No results for input(s): GLUCAP in the last 168 hours.  Coagulation Studies: No results for input(s): LABPROT, INR in the last 72 hours.  Imaging: Dg Chest 2 View  Result Date: 04/26/2017 CLINICAL DATA:  Chest pain and shortness of breath. EXAM: CHEST  2 VIEW COMPARISON:  02/17/2017. FINDINGS: Cardiomegaly. No active infiltrates or failure. No effusion or pneumothorax. Chronic pulmonary interstitial changes, similar to priors. RIGHT-sided aortic arch. Bones unremarkable. IMPRESSION: Cardiomegaly, no active disease. Electronically Signed   By: Staci Righter M.D.   On: 04/26/2017 10:05      Patient Profile   Randy Hebert is a 51 y.o. male with  morbid obesity, COPD with ongoing tobacco use, HTN, DM2, HL, undiagnosed OSA and CAD s/p lateral STEMI in 6/16 with PCI/DES to first diagonal who is transferred from [facility] with ACS/possible NSTEMI.  Assessment/Plan   1. Chest pain - possible ACS/NSTEMI - has known CAD with previous stent in 2016. Multiple CRFs including ongoing tobacco use - Initial troponin minimally elevated. ECG non-acute - Continue heparin, ASA, statin, IV NTG. No b-blocker due to intolerance (heart block in setting of previous MI) - Cycle troponins. - Plan cath in am  2. HTN - BP improved with IV NTG  3. DM2 - Cover with SSI - hold metformin for cath - No ACE/ARB due to angioedema  4. Tobacco use - counseled on need for smoking cessation  5. Undiagnosed OSA - will need outpatient sleep study   Glori Bickers, MD 04/26/2017, 5:52 PM  Advanced Heart Failure Team Pager [phone]  (M-F; 7a - 4p)  Please contact [facility] for night-coverage after hours (4p -7a ) and weekends on [email or web address]

## 2017-04-26 NOTE — ED Notes (Signed)
Nitroglycerin ointment removed from patient arm.

## 2017-04-26 NOTE — ED Notes (Signed)
Pt stable and ready for transport to [id number]. Report given to Randol Kern, RN.

## 2017-04-27 ENCOUNTER — Encounter (HOSPITAL_COMMUNITY)
Admission: EM | Disposition: A | Discharge status: Home / Self Care | Payer: Self-pay | Source: Home / Self Care | Attending: Cardiology

## 2017-04-27 ENCOUNTER — Inpatient Hospital Stay (HOSPITAL_COMMUNITY): Payer: Medicaid Other

## 2017-04-27 DIAGNOSIS — E119 Type 2 diabetes mellitus without complications: Secondary | ICD-10-CM | POA: Diagnosis not present

## 2017-04-27 DIAGNOSIS — Z823 Family history of stroke: Secondary | ICD-10-CM | POA: Diagnosis not present

## 2017-04-27 DIAGNOSIS — Z880 Allergy status to penicillin: Secondary | ICD-10-CM | POA: Diagnosis not present

## 2017-04-27 DIAGNOSIS — G4730 Sleep apnea, unspecified: Secondary | ICD-10-CM | POA: Diagnosis not present

## 2017-04-27 DIAGNOSIS — Z833 Family history of diabetes mellitus: Secondary | ICD-10-CM | POA: Diagnosis not present

## 2017-04-27 DIAGNOSIS — I25110 Atherosclerotic heart disease of native coronary artery with unstable angina pectoris: Secondary | ICD-10-CM | POA: Diagnosis not present

## 2017-04-27 DIAGNOSIS — I110 Hypertensive heart disease with heart failure: Secondary | ICD-10-CM | POA: Diagnosis not present

## 2017-04-27 DIAGNOSIS — Z8249 Family history of ischemic heart disease and other diseases of the circulatory system: Secondary | ICD-10-CM | POA: Diagnosis not present

## 2017-04-27 DIAGNOSIS — R001 Bradycardia, unspecified: Secondary | ICD-10-CM | POA: Diagnosis not present

## 2017-04-27 DIAGNOSIS — Z6838 Body mass index (BMI) 38.0-38.9, adult: Secondary | ICD-10-CM | POA: Diagnosis not present

## 2017-04-27 DIAGNOSIS — I10 Essential (primary) hypertension: Secondary | ICD-10-CM | POA: Diagnosis not present

## 2017-04-27 DIAGNOSIS — J449 Chronic obstructive pulmonary disease, unspecified: Secondary | ICD-10-CM | POA: Diagnosis not present

## 2017-04-27 DIAGNOSIS — I5022 Chronic systolic (congestive) heart failure: Secondary | ICD-10-CM | POA: Diagnosis not present

## 2017-04-27 DIAGNOSIS — I214 Non-ST elevation (NSTEMI) myocardial infarction: Secondary | ICD-10-CM

## 2017-04-27 DIAGNOSIS — F17210 Nicotine dependence, cigarettes, uncomplicated: Secondary | ICD-10-CM | POA: Diagnosis present

## 2017-04-27 DIAGNOSIS — I2510 Atherosclerotic heart disease of native coronary artery without angina pectoris: Secondary | ICD-10-CM | POA: Diagnosis not present

## 2017-04-27 DIAGNOSIS — E785 Hyperlipidemia, unspecified: Secondary | ICD-10-CM | POA: Diagnosis not present

## 2017-04-27 DIAGNOSIS — Z7984 Long term (current) use of oral hypoglycemic drugs: Secondary | ICD-10-CM | POA: Diagnosis not present

## 2017-04-27 DIAGNOSIS — F17200 Nicotine dependence, unspecified, uncomplicated: Secondary | ICD-10-CM | POA: Diagnosis not present

## 2017-04-27 DIAGNOSIS — I4510 Unspecified right bundle-branch block: Secondary | ICD-10-CM | POA: Diagnosis not present

## 2017-04-27 DIAGNOSIS — I252 Old myocardial infarction: Secondary | ICD-10-CM | POA: Diagnosis not present

## 2017-04-27 DIAGNOSIS — K219 Gastro-esophageal reflux disease without esophagitis: Secondary | ICD-10-CM | POA: Diagnosis present

## 2017-04-27 DIAGNOSIS — F329 Major depressive disorder, single episode, unspecified: Secondary | ICD-10-CM | POA: Diagnosis present

## 2017-04-27 DIAGNOSIS — I255 Ischemic cardiomyopathy: Secondary | ICD-10-CM | POA: Diagnosis not present

## 2017-04-27 DIAGNOSIS — Z955 Presence of coronary angioplasty implant and graft: Secondary | ICD-10-CM | POA: Diagnosis not present

## 2017-04-27 DIAGNOSIS — Z888 Allergy status to other drugs, medicaments and biological substances status: Secondary | ICD-10-CM | POA: Diagnosis not present

## 2017-04-27 DIAGNOSIS — R079 Chest pain, unspecified: Secondary | ICD-10-CM | POA: Diagnosis present

## 2017-04-27 HISTORY — PX: LEFT HEART CATH AND CORONARY ANGIOGRAPHY: CATH118249

## 2017-04-27 HISTORY — PX: CORONARY STENT INTERVENTION: CATH118234

## 2017-04-27 HISTORY — DX: Non-ST elevation (NSTEMI) myocardial infarction: I21.4

## 2017-04-27 LAB — LIPID PANEL
Cholesterol: 93 mg/dL (ref 0–200)
HDL: 18 mg/dL — AB (ref >40)
LDL Cholesterol: 34 mg/dL (ref 0–99)
Total CHOL/HDL Ratio: 5.2 RATIO
Triglycerides: 206 mg/dL — ABNORMAL HIGH (ref <150)
VLDL: 41 mg/dL — ABNORMAL HIGH (ref 0–40)

## 2017-04-27 LAB — CBC
HCT: 40.5 % (ref 39.0–52.0)
HEMATOCRIT: 40.5 % (ref 39.0–52.0)
HEMOGLOBIN: 12.4 g/dL — AB (ref 13.0–17.0)
Hemoglobin: 12.5 g/dL — ABNORMAL LOW (ref 13.0–17.0)
MCH: 24.9 pg — AB (ref 26.0–34.0)
MCH: 24.7 pg — ABNORMAL LOW (ref 26.0–34.0)
MCHC: 30.9 g/dL (ref 30.0–36.0)
MCHC: 30.6 g/dL (ref 30.0–36.0)
MCV: 80.5 fL (ref 78.0–100.0)
MCV: 80.5 fL (ref 78.0–100.0)
PLATELETS: 245 10*3/uL (ref 150–400)
Platelets: 236 10*3/uL (ref 150–400)
RBC: 5.03 MIL/uL (ref 4.22–5.81)
RBC: 5.03 MIL/uL (ref 4.22–5.81)
RDW: 16.6 % — AB (ref 11.5–15.5)
RDW: 16.2 % — ABNORMAL HIGH (ref 11.5–15.5)
WBC: 9.5 10*3/uL (ref 4.0–10.5)
WBC: 10.5 10*3/uL (ref 4.0–10.5)

## 2017-04-27 LAB — GLUCOSE, CAPILLARY
GLUCOSE-CAPILLARY: 133 mg/dL — AB (ref 65–99)
Glucose-Capillary: 192 mg/dL — ABNORMAL HIGH (ref 65–99)
Glucose-Capillary: 135 mg/dL — ABNORMAL HIGH (ref 65–99)

## 2017-04-27 LAB — ECHOCARDIOGRAM COMPLETE
HEIGHTINCHES: 66 in
WEIGHTICAEL: 3636.8 [oz_av]

## 2017-04-27 LAB — POCT ACTIVATED CLOTTING TIME: Activated Clotting Time: 252 seconds

## 2017-04-27 LAB — TROPONIN I
Troponin I: 16.24 ng/mL (ref <0.03)
Troponin I: 7.17 ng/mL (ref <0.03)

## 2017-04-27 LAB — CREATININE, SERUM: Creatinine, Ser: 0.67 mg/dL (ref 0.61–1.24)

## 2017-04-27 LAB — HEPARIN LEVEL (UNFRACTIONATED): Heparin Unfractionated: 0.12 IU/mL — ABNORMAL LOW (ref 0.30–0.70)

## 2017-04-27 SURGERY — LEFT HEART CATH AND CORONARY ANGIOGRAPHY
Anesthesia: LOCAL

## 2017-04-27 MED ORDER — HEPARIN BOLUS VIA INFUSION
2500.0000 [IU] | Freq: Once | INTRAVENOUS | Status: AC
  Administered 2017-04-27: 2500 [IU] via INTRAVENOUS
  Filled 2017-04-27: qty 2500

## 2017-04-27 MED ORDER — PRASUGREL HCL 10 MG PO TABS
ORAL_TABLET | ORAL | Status: AC
  Filled 2017-04-27: qty 1

## 2017-04-27 MED ORDER — SODIUM CHLORIDE 0.9% FLUSH: 3.0000 mL | INTRAVENOUS | Status: DC | PRN

## 2017-04-27 MED ORDER — IOPAMIDOL (ISOVUE-370) INJECTION 76%
INTRAVENOUS | Status: DC | PRN
  Administered 2017-04-27: 135 mL via INTRA_ARTERIAL

## 2017-04-27 MED ORDER — SODIUM CHLORIDE 0.9% FLUSH
3.0000 mL | Freq: Two times a day (BID) | INTRAVENOUS | Status: DC
Start: 2017-04-27 — End: 2017-04-28
  Administered 2017-04-27 – 2017-04-28 (×2): 3 mL via INTRAVENOUS

## 2017-04-27 MED ORDER — HEPARIN SODIUM (PORCINE) 1000 UNIT/ML IJ SOLN
INTRAMUSCULAR | Status: DC | PRN
  Administered 2017-04-27: 3000 [IU] via INTRAVENOUS
  Administered 2017-04-27: 5000 [IU] via INTRAVENOUS
  Administered 2017-04-27: 2000 [IU] via INTRAVENOUS

## 2017-04-27 MED ORDER — IOPAMIDOL (ISOVUE-370) INJECTION 76%
INTRAVENOUS | Status: AC
  Filled 2017-04-27: qty 100

## 2017-04-27 MED ORDER — SODIUM CHLORIDE 0.9 % IV SOLN: INTRAVENOUS | Status: DC

## 2017-04-27 MED ORDER — FENTANYL CITRATE (PF) 100 MCG/2ML IJ SOLN
INTRAMUSCULAR | Status: DC | PRN
  Administered 2017-04-27: 25 ug via INTRAVENOUS

## 2017-04-27 MED ORDER — NITROGLYCERIN IN D5W 200-5 MCG/ML-% IV SOLN
INTRAVENOUS | Status: AC
  Filled 2017-04-27: qty 250

## 2017-04-27 MED ORDER — SODIUM CHLORIDE 0.9 % IV SOLN: 250.0000 mL | INTRAVENOUS | Status: DC | PRN

## 2017-04-27 MED ORDER — FENTANYL CITRATE (PF) 100 MCG/2ML IJ SOLN
INTRAMUSCULAR | Status: AC
  Filled 2017-04-27: qty 2

## 2017-04-27 MED ORDER — HEPARIN (PORCINE) IN NACL 2-0.9 UNIT/ML-% IJ SOLN
INTRAMUSCULAR | Status: AC
  Filled 2017-04-27: qty 1000

## 2017-04-27 MED ORDER — PERFLUTREN LIPID MICROSPHERE
1.0000 mL | INTRAVENOUS | Status: AC | PRN
  Administered 2017-04-27: 2 mL via INTRAVENOUS
  Filled 2017-04-27: qty 10

## 2017-04-27 MED ORDER — TIROFIBAN (AGGRASTAT) BOLUS VIA INFUSION
INTRAVENOUS | Status: DC | PRN
  Administered 2017-04-27: 2577.5 ug via INTRAVENOUS

## 2017-04-27 MED ORDER — NITROGLYCERIN IN D5W 200-5 MCG/ML-% IV SOLN: 5.0000 ug/min | INTRAVENOUS | Status: DC

## 2017-04-27 MED ORDER — MIDAZOLAM HCL 2 MG/2ML IJ SOLN
INTRAMUSCULAR | Status: AC
  Filled 2017-04-27: qty 2

## 2017-04-27 MED ORDER — PRASUGREL HCL 10 MG PO TABS
ORAL_TABLET | ORAL | Status: DC | PRN
  Administered 2017-04-27: 60 mg via ORAL

## 2017-04-27 MED ORDER — TIROFIBAN HCL IN NACL 5-0.9 MG/100ML-% IV SOLN
INTRAVENOUS | Status: AC
  Filled 2017-04-27: qty 100

## 2017-04-27 MED ORDER — MORPHINE SULFATE (PF) 4 MG/ML IV SOLN: 2.0000 mg | INTRAVENOUS | Status: DC | PRN

## 2017-04-27 MED ORDER — TIROFIBAN HCL IN NACL 5-0.9 MG/100ML-% IV SOLN
INTRAVENOUS | Status: AC | PRN
  Administered 2017-04-27: 0.15 ug/kg/min via INTRAVENOUS

## 2017-04-27 MED ORDER — PRASUGREL HCL 10 MG PO TABS
ORAL_TABLET | ORAL | Status: AC
Start: 2017-04-27 — End: ?
  Filled 2017-04-27: qty 1

## 2017-04-27 MED ORDER — MIDAZOLAM HCL 2 MG/2ML IJ SOLN
INTRAMUSCULAR | Status: DC | PRN
  Administered 2017-04-27: 1 mg via INTRAVENOUS

## 2017-04-27 MED ORDER — HEPARIN (PORCINE) IN NACL 2-0.9 UNIT/ML-% IJ SOLN
INTRAMUSCULAR | Status: AC | PRN
  Administered 2017-04-27 (×2): 1000 mL

## 2017-04-27 MED ORDER — TIROFIBAN HCL IV 12.5 MG/250 ML
0.0750 ug/kg/min | INTRAVENOUS | Status: AC
  Administered 2017-04-27: 12:00:00 0.075 ug/kg/min via INTRAVENOUS
  Filled 2017-04-27: qty 250

## 2017-04-27 MED ORDER — HYDRALAZINE HCL 20 MG/ML IJ SOLN: 5.0000 mg | INTRAMUSCULAR | Status: AC | PRN

## 2017-04-27 MED ORDER — TIROFIBAN (AGGRASTAT) BOLUS VIA INFUSION: 25.0000 ug/kg | Freq: Once | INTRAVENOUS | Status: DC

## 2017-04-27 MED ORDER — HEPARIN SODIUM (PORCINE) 5000 UNIT/ML IJ SOLN
5000.0000 [IU] | Freq: Three times a day (TID) | INTRAMUSCULAR | Status: DC
  Administered 2017-04-27 – 2017-04-28 (×2): 5000 [IU] via SUBCUTANEOUS
  Filled 2017-04-27 (×2): qty 1

## 2017-04-27 MED ORDER — VERAPAMIL HCL 2.5 MG/ML IV SOLN
INTRAVENOUS | Status: DC | PRN
  Administered 2017-04-27: 10 mL via INTRA_ARTERIAL

## 2017-04-27 MED ORDER — HEPARIN SODIUM (PORCINE) 1000 UNIT/ML IJ SOLN
INTRAMUSCULAR | Status: AC
  Filled 2017-04-27: qty 1

## 2017-04-27 MED ORDER — LIDOCAINE HCL 2 % IJ SOLN
INTRAMUSCULAR | Status: AC
  Filled 2017-04-27: qty 10

## 2017-04-27 MED ORDER — LIDOCAINE HCL (PF) 1 % IJ SOLN
INTRAMUSCULAR | Status: DC | PRN
  Administered 2017-04-27: 2 mL via INTRADERMAL

## 2017-04-27 MED ORDER — PRASUGREL HCL 10 MG PO TABS
10.0000 mg | ORAL_TABLET | Freq: Every day | ORAL | Status: DC
  Administered 2017-04-28: 10 mg via ORAL
  Filled 2017-04-27: qty 1

## 2017-04-27 MED ORDER — VERAPAMIL HCL 2.5 MG/ML IV SOLN
INTRAVENOUS | Status: AC
  Filled 2017-04-27: qty 2

## 2017-04-27 SURGICAL SUPPLY — 21 items
BALLN EUPHORA RX 2.25X12 (BALLOONS) ×2
BALLN ~~LOC~~ EMERGE MR 3.0X12 (BALLOONS) ×2
BALLOON EUPHORA RX 2.25X12 (BALLOONS) IMPLANT
BALLOON ~~LOC~~ EMERGE MR 3.0X12 (BALLOONS) IMPLANT
CATH EXPO 5F FL3.5 (CATHETERS) ×1 IMPLANT
CATH INFINITI 5FR ANG PIGTAIL (CATHETERS) ×1 IMPLANT
CATH INFINITI JR4 5F (CATHETERS) ×1 IMPLANT
CATH LAUNCHER 5F RADR (CATHETERS) IMPLANT
CATH LAUNCHER 6FR EBU3.5 (CATHETERS) ×1 IMPLANT
CATHETER LAUNCHER 5F RADR (CATHETERS) ×2
DEVICE RAD COMP TR BAND LRG (VASCULAR PRODUCTS) ×1 IMPLANT
GLIDESHEATH SLEND SS 6F .021 (SHEATH) ×1 IMPLANT
GUIDEWIRE INQWIRE 1.5J.035X260 (WIRE) IMPLANT
INQWIRE 1.5J .035X260CM (WIRE) ×2
KIT ENCORE 26 ADVANTAGE (KITS) ×1 IMPLANT
KIT HEART LEFT (KITS) ×2 IMPLANT
PACK CARDIAC CATHETERIZATION (CUSTOM PROCEDURE TRAY) ×2 IMPLANT
STENT SIERRA 2.75 X 15 MM (Permanent Stent) ×1 IMPLANT
TRANSDUCER W/STOPCOCK (MISCELLANEOUS) ×2 IMPLANT
TUBING CIL FLEX 10 FLL-RA (TUBING) ×2 IMPLANT
WIRE COUGAR XT STRL 190CM (WIRE) ×1 IMPLANT

## 2017-04-27 NOTE — Progress Notes (Signed)
ANTICOAGULATION CONSULT NOTE - F/u Consult  Pharmacy Consult for Heparin Indication: chest pain/ACS  Allergies  Allergen Reactions  . Carvedilol Other (See Comments)    Sinus pause on telemetry >3 seconds. Longest one 9 sec. No AV nodal agent  . Lisinopril Anaphylaxis, Shortness Of Breath and Swelling    Angioedema, required intubation and mechanical ventilation  . Amoxicillin Nausea And Vomiting    Nausea only - no allergy     Patient Measurements: Height: 5\' 6"  (167.6 cm) Weight: 227 lb 4.8 oz (103.1 kg) IBW/kg (Calculated) : 63.8 Heparin Dosing Weight:  87 kg  Vital Signs: Temp: 98 F (36.7 C) (08/27 0415) Temp Source: Oral (08/26 1920) BP: 118/82 (08/27 0600) Pulse Rate: 73 (08/27 0600)  Labs:  Recent Labs  04/26/17 0933  04/26/17 1751 04/26/17 1940 04/26/17 2336 04/27/17 0505  HGB 13.9  --   --   --   --  12.5*  HCT 43.7  --   --   --   --  40.5  PLT 261  --   --   --   --  245  LABPROT  --   --  13.5  --   --   --   INR  --   --  1.03  --   --   --   HEPARINUNFRC  --   --   --  <0.10*  --  0.12*  CREATININE 0.93  --   --   --   --   --   TROPONINI  --   < > 1.90*  --  7.17* 16.24*  < > = values in this interval not displayed.  Estimated Creatinine Clearance: 104.5 mL/min (by C-G formula based on SCr of 0.93 mg/dL).   Medical History: Past Medical History:  Diagnosis Date  . Allergy   . Anxiety   . Arthritis   . Asthma    hip replacement  . Back pain   . Bulging of cervical intervertebral disc   . CAD (coronary artery disease)    lateral STEMI 02/06/2015 00% D1 occlusion treated with Promus Premier 2.5 mm x 16 mm DES, 70% ramus stenosis, 40% mid RCA stenosis, 45% distal RCA stenosis, EF 45-50%  . CHF (congestive heart failure) ([place])   . COPD (chronic obstructive pulmonary disease) ([place])   . Depression   . Diabetes mellitus without complication ([place])   . 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 Dry eye   .  Dyspnea   . Early satiety 09/23/2016  . GERD (gastroesophageal reflux disease)   . Headache   . Heart murmur   . Hip pain   . Hyperlipidemia   . Hypertension   . MI (myocardial infarction) ([place])   . Myocardial infarction ([place])   . Neck pain   . Otitis media   . Pleurisy   . Sinus pause    9 sec sinus pause on telemetry after started on coreg after MI, avoid AV nodal blocking agent  . Sleep apnea   . Substance abuse    alcoholic  . Unilateral primary osteoarthritis, right hip 07/01/2016   Assessment: 51 yo m admitted with chest pain, now with elevated troponins. Pharmacy consulted to dose heparin.   Heparin level subtherapeutic at 0.12 with no infusion issues per RN. CBC stable and no s/s bleeding noted.   Possible cath per cardiology notes.   Goal of Therapy:  Heparin level 0.3-0.7 units/ml Monitor platelets by  anticoagulation protocol: Yes   Plan:  Bolus heparin 2500 units x1 Increase heparin infusion to 1450 units/hr Heparin level in 6 hrs Daily heparin level and CBC  Monitor for s/s bleeding F/u cards plan   Argie Ramming, PharmD Clinical Pharmacist 04/27/17 6:18 AM

## 2017-04-27 NOTE — Interval H&P Note (Signed)
History and Physical Interval Note:  04/27/2017 9:45 AM  Randy Hebert  has presented today for cardiac catheterization, with the diagnosis of NSTEMI. The various methods of treatment have been discussed with the patient and family. After consideration of risks, benefits and other options for treatment, the patient has consented to  Procedure(s): LEFT HEART CATH AND CORONARY ANGIOGRAPHY (N/A) as a surgical intervention .  The patient's history has been reviewed, patient examined, no change in status, stable for surgery.  I have reviewed the patient's chart and labs.  Questions were answered to the patient's satisfaction.    Cath Lab Visit (complete for each Cath Lab visit)  Clinical Evaluation Leading to the Procedure:   ACS: Yes.    Non-ACS: N/A  [name]

## 2017-04-27 NOTE — Progress Notes (Signed)
  Echocardiogram 2D Echocardiogram has been performed.  Jennette Dubin 04/27/2017, 3:14 PM

## 2017-04-27 NOTE — Progress Notes (Signed)
CRITICAL VALUE ALERT  Critical Value:  Trop. 7.17 (0030 result)  Date & Time Notied:  Seen in results list on review of results. Was not notified by lab of critical value.   Provider Notified: Cardiology Fellow text paged  Orders Received/Actions taken: awaiting response or new orders, none received at this time. Will continue to monitor.

## 2017-04-27 NOTE — Brief Op Note (Signed)
Brief Cardiac Catheterization Note  Date: 04/27/2017 Time: 10:58 AM  PATIENT:  Randy Hebert  51 y.o. male  PRE-OPERATIVE DIAGNOSIS:  NSTEMI  POST-OPERATIVE DIAGNOSIS:  Same  PROCEDURE:  Procedure(s): LEFT HEART CATH AND CORONARY ANGIOGRAPHY (N/A)  SURGEON:  Surgeon(s) and Role:    * Charlii Yost, MD - Primary  FINDINGS: 1. 99% thrombotic proximal stenosis of moderate to large caliber ramus branch. 2. Patent stent in D1. 3. Ectatic LAD and RCA with mild to moderate disease. 4. Mildly elevated left ventricular filling pressure. 5. Moderately reduced LVEF with mid and apical anterior hypokinesis. 6. Successful PCI to proximal ramus with placement of Xience Sierra 2.75 x 12 mm DES post-dilated to 3.0 mm.  RECOMMENDATIONS: 1. Continue tirofiban x 4 hours. 2. DAPT with aspirin and prasugrel (prior intolerance to ticagrelor) for at least 12 months. 3. Medical therapy of ischemic cardiomyopathy. 4. Wean off NTG infusion, as tolerated.  Nelva Bush, MD [facility] Pager: [phone]

## 2017-04-27 NOTE — Progress Notes (Signed)
Patient had 5 pauses the longest 5.67 seconds between 1210 and 1240 this afternoon, 3.0 second pause at 1616. Dr. Saunders Revel into see patient and aware of pauses, will continue to monitor at this time per Dr. Saunders Revel.  Patient stated he does not have home oxygen but has needed oxygen while in the hospital during his admissions. Requires 4 liters via [place] at this time. Discussed with Dr. Saunders Revel and plan to ambulate patient in am with and without oxygen to determine if home oxygen is needed prior to discharge. Reported to Kelby Fam RN to notify day shift nurse to evaluate oxygen need in am per Dr. Saunders Revel.  Patient aware of pauses and plan for oxygen evaluation. Discussed information with Dr. Saunders Revel and aware of plan of care. Needs evaluation for sleep apnea and plan to f/u with referral at discharge for sleep study.  Discussed plan of care for smoking cessation, risks of continuing to smoke and having heart disease and diabetes. Reviewed benefits of quitting smoking. Patient verbalizes desire to quit and responsive to information reviewed.

## 2017-04-27 NOTE — Discharge Summary (Signed)
Discharge Summary    Patient ID: Randy Hebert,  MRN: [id number], DOB/AGE: 1966-05-14 51 y.o.  Admit date: 04/26/2017 Discharge date: 04/28/2017  Primary Care Provider: Raylene Everts Primary Cardiologist: [name]  Discharge Diagnoses    Active Problems:   NSTEMI (non-ST elevated myocardial infarction) [facility])   Essential hypertension   CAD (coronary artery disease)   Tobacco use disorder   Chronic systolic CHF (congestive heart failure) ([place])   Hyperlipidemia   Type 2 diabetes mellitus without complication, without long-term current use of insulin (HCC)   Non-ST elevation (NSTEMI) myocardial infarction (HCC)   Bradycardia   Sleep apnea   Allergies Allergies  Allergen Reactions  . Carvedilol Other (See Comments)    Sinus pause on telemetry >3 seconds. Longest one 9 sec. No AV nodal agent  . Lisinopril Anaphylaxis, Shortness Of Breath and Swelling    Angioedema, required intubation and mechanical ventilation  . Amoxicillin Nausea And Vomiting    Nausea only - no allergy     Diagnostic Studies/Procedures    LHC: 04/27/17  Conclusion   Conclusions: 1. Severe single-vessel coronary artery disease with subtotal thrombotic occlusion of proximal ramus intermedius with TIMI-1 flow. 2. Mild to moderate disease involving the LAD and RCA, which are large and ectatic vessels. Previously placed diagonal stent is widely patent. 3. Moderately reduced LVEF with mid and apical anterolateral hypokinesis. 4. Mildly elevated left ventricular filling pressure. 5. Successful PCI to proximal ramus intermedius with placement of a Xience Sierra 2.75 x 15 mm drug-eluting stent (post dilated to 3.1 mm) with 0% residual stenosis and TIMI-3 flow.  Recommendations: 1. Continue tirofiban for 4 hours post procedure. 2. Dual antiplatelet therapy with aspirin and present growth for at least 12 months (patient has history of intolerance to ticagrelor). 3. Aggressive secondary prevention and  medical therapy of CAD and ischemic cardiomyopathy with moderately reduced LVEF. Smoking cessation strongly encouraged.   TTE: 04/27/17  Study Conclusions  - Left ventricle: The cavity size was normal. There was mild   concentric hypertrophy. Systolic function was mildly to   moderately reduced. The estimated ejection fraction was in the   range of 40% to 45%. Diffuse hypokinesis. Doppler parameters are   consistent with abnormal left ventricular relaxation (grade 1   diastolic dysfunction). Doppler parameters are consistent with   elevated ventricular end-diastolic filling pressure. - Ventricular septum: Septal motion showed paradox. - Aortic valve: Trileaflet; normal thickness leaflets. There was no   regurgitation. - Mitral valve: Structurally normal valve. There was trivial   regurgitation. - Right ventricle: The cavity size was moderately dilated. Wall   thickness was normal. Systolic function was moderately reduced. - Right atrium: The atrium was normal in size. - Tricuspid valve: There was trivial regurgitation. - Pulmonary arteries: Systolic pressure was within the normal   range. - Inferior vena cava: The vessel was dilated. The respirophasic   diameter changes were blunted (< 50%), consistent with elevated   central venous pressure. - Pericardium, extracardiac: There was no pericardial effusion. _____________   History of Present Illness     Randy Hebert a 51 y.o.male with morbid obesity, COPD with ongoing tobacco use, HTN, DM2, HL, undiagnosed OSA and CAD s/p lateral STEMI in 6/16 with PCI/DES to first diagonal who was transferred from [facility] with ACS/possible NSTEMI.  Admitted in 6/16 with lateral STEMI. Cath:  6. 1st Diag lesion, 100% stenosed. A Promus Premier 2.5 mm x 16 mm drug-eluting stent was placed. There is a 0%  residual stenosis post intervention. 7. Ramus lesion, 70% stenosed. 8. Mid RCA lesion, 40% stenosed. Dist RCA lesion, 45% stenosed. 9. Mild  to moderately reduced LVEF with anterolateral and apical hypokinesis and elevated LVEDP   Admitted for COPD exacerbation in 6/18. Troponin peaked at 0.33 but felt not to be ACS. Treated medically. Echo 6/18 with EF 55% no RWMA,  He is not on beta blockers due to bradycardia. He is allergic to ACE inhibitor and ARB due to angioedema.  Stated he was in his usually state of health the morning of admission when he was sitting on the porch and went to light a cigarette. Developed pain middle of his chest. Pain was coming and going so went to ED. In ED ECG with NSR and changes RBBB. No ST-T changes. Trop 0.12. Given ASA. Started on heparin and IV NTG. Stated the pain was intermittent but was better when he was lying on his side. + nausea at times. Chronic dyspnea.   Smokes 1PPD. Snores heavily but says sleep study hasn't been approved. No bleeding recently. He was admitted and continued on IV heparin with plans for cath.   Hospital Course     Underwent LHC with Dr. Saunders Revel noted above with 99% thrombotic proximal stenosis of moderate to large caliber ramus treated with DESx1. Patent stent noted in D1 with moderately reduced LVEF and mid/apical anterior hypokinesis. Was continued on tirofiban 4 hours post cath. Plan for DAPT with ASA/prasugrel for at least 12 months. Reported previous intolerance to Brilinta. Post cath labs showed Cr 0.74 and Hgb 12.4. Trop peaked at 16.24. Started on high dose statin, LDL 34. No reported complications noted post cath. Noted to have periods of sleep apnea while at rest with bradycardia and pauses on telemetry. Was placed on Cpap by RT with improvement. Reports he was seen by his PCP one week prior to admission and planned to set up for outpatient sleep study. Worked with cardiac rehab. Follow up echo showed EF of 40-45% with G1DD. Reported no further episodes of chest pain. No BB given hx of pauses, and reports severe anaphylaxis history with Lisinopril. Will resume hold  amlodipine at 5mg  at he has had soft blood pressures at times this admission.   He was seen by Dr. [name] Billings and determined stable for discharge home. Follow up in the office has been arranged. Medications are listed below.   General: Well developed, well nourished, male appearing in no acute distress. Head: Normocephalic, atraumatic.  Neck: Supple without bruits, JVD. Lungs:  Resp regular and unlabored, CTA. Heart: RRR, S1, S2, no S3, S4, or murmur; no rub. Abdomen: Soft, non-tender, non-distended with normoactive bowel sounds. No hepatomegaly. No rebound/guarding. No obvious abdominal masses. Extremities: No clubbing, cyanosis, edema. Distal pedal pulses are 2+ bilaterally. R radial cath site stable without bruising or hematoma Neuro: Alert and oriented X 3. Moves all extremities spontaneously. Psych: Normal affect.  _____________  Discharge Vitals Blood pressure 113/78, pulse 83, temperature 97.8 F (36.6 C), temperature source Oral, resp. rate (!) 22, height 5\' 6"  (1.676 m), weight 224 lb 13.9 oz (102 kg), SpO2 96 %.  Filed Weights   04/26/17 1643 04/27/17 0415 04/28/17 0625  Weight: 228 lb 11.2 oz (103.7 kg) 227 lb 4.8 oz (103.1 kg) 224 lb 13.9 oz (102 kg)    Labs & Radiologic Studies    CBC  Recent Labs  04/27/17 1246 04/28/17 1018  WBC 10.5 9.6  HGB 12.4* 12.7*  HCT 40.5 42.4  MCV 80.5 82.0  PLT 236 956   Basic Metabolic Panel  Recent Labs  04/26/17 0933 04/27/17 1246 04/28/17 0345  NA 136  --  141  K 3.6  --  4.4  CL 100*  --  103  CO2 25  --  30  GLUCOSE 241*  --  133*  BUN 7  --  <5*  CREATININE 0.93 0.67 0.74  CALCIUM 8.8*  --  8.8*   Liver Function Tests No results for input(s): AST, ALT, ALKPHOS, BILITOT, PROT, ALBUMIN in the last 72 hours. No results for input(s): LIPASE, AMYLASE in the last 72 hours. Cardiac Enzymes  Recent Labs  04/26/17 1751 04/26/17 2336 04/27/17 0505  TROPONINI 1.90* 7.17* 16.24*   BNP Invalid input(s):  POCBNP D-Dimer No results for input(s): DDIMER in the last 72 hours. Hemoglobin A1C No results for input(s): HGBA1C in the last 72 hours. Fasting Lipid Panel  Recent Labs  04/27/17 0505  CHOL 93  HDL 18*  LDLCALC 34  TRIG 206*  CHOLHDL 5.2   Thyroid Function Tests  Recent Labs  04/26/17 1751  TSH 1.635   _____________  Dg Chest 2 View  Result Date: 04/26/2017 CLINICAL DATA:  Chest pain and shortness of breath. EXAM: CHEST  2 VIEW COMPARISON:  02/17/2017. FINDINGS: Cardiomegaly. No active infiltrates or failure. No effusion or pneumothorax. Chronic pulmonary interstitial changes, similar to priors. RIGHT-sided aortic arch. Bones unremarkable. IMPRESSION: Cardiomegaly, no active disease. Electronically Signed   By: Staci Righter M.D.   On: 04/26/2017 10:05   Disposition   Pt is being discharged home today in good condition.  Follow-up Plans & Appointments    Follow-up Information    Lendon Colonel, NP Follow up on 05/12/2017.   Specialties:  Nurse Practitioner, Radiology, Cardiology Why:  at 4pm for your follow up appt.  Contact information: [place] [address] [phone]        [facility] Follow up.   Why:  home oxygen  Contact information: [place] [address] [phone]        Raylene Everts, MD Follow up.   Specialty:  Family Medicine Why:  Please ensure that your follow up with your MD regarding outpatient testing for sleep apnea as we discussed  Contact information: [address] [place] [address] [phone]          Discharge Instructions    Amb Referral to Cardiac Rehabilitation    Complete by:  As directed    Diagnosis:   Coronary Stents NSTEMI     Call MD for:  redness, tenderness, or signs of infection (pain, swelling, redness, odor or green/yellow discharge around incision site)    Complete by:  As directed    Diet - low sodium heart healthy     Complete by:  As directed    Discharge instructions    Complete by:  As directed    Radial Site Care Refer to this sheet in the next few weeks. These instructions provide you with information on caring for yourself after your procedure. Your caregiver may also give you more specific instructions. Your treatment has been planned according to current medical practices, but problems sometimes occur. Call your caregiver if you have any problems or questions after your procedure. HOME CARE INSTRUCTIONS You may shower the day after the procedure.Remove the bandage (dressing) and gently wash the site with plain soap and water.Gently pat the site dry.  Do not  apply powder or lotion to the site.  Do not submerge the affected site in water for 3 to 5 days.  Inspect the site at least twice daily.  Do not flex or bend the affected arm for 24 hours.  No lifting over 5 pounds (2.3 kg) for 5 days after your procedure.  Do not drive home if you are discharged the same day of the procedure. Have someone else drive you.  You may drive 24 hours after the procedure unless otherwise instructed by your caregiver.  What to expect: Any bruising will usually fade within 1 to 2 weeks.  Blood that collects in the tissue (hematoma) may be painful to the touch. It should usually decrease in size and tenderness within 1 to 2 weeks.  SEEK IMMEDIATE MEDICAL CARE IF: You have unusual pain at the radial site.  You have redness, warmth, swelling, or pain at the radial site.  You have drainage (other than a small amount of blood on the dressing).  You have chills.  You have a fever or persistent symptoms for more than 72 hours.  You have a fever and your symptoms suddenly get worse.  Your arm becomes pale, cool, tingly, or numb.  You have heavy bleeding from the site. Hold pressure on the site.   PLEASE DO NOT MISS ANY DOSES OF YOUR EFFIENT!!!! Also we restarted your home norvasc at 5mg  daily instead of 10mg  because your  blood pressure has been low at times. Please monitor your blood pressures at home to ensure that you are not getting to low or high and bring to your follow up appt. Important that you follow up with your PCP about your sleep apnea!!!! Please call the office with any questions.   Increase activity slowly    Complete by:  As directed       Discharge Medications   Current Discharge Medication List    START taking these medications   Details  prasugrel (EFFIENT) 10 MG TABS tablet Take 1 tablet (10 mg total) by mouth daily. Qty: 90 tablet, Refills: 1      CONTINUE these medications which have CHANGED   Details  amLODipine (NORVASC) 10 MG tablet Take 0.5 tablets (5 mg total) by mouth daily. Qty: 30 tablet, Refills: 4    aspirin 81 MG EC tablet Take 1 tablet (81 mg total) by mouth daily. Qty: 30 tablet    atorvastatin (LIPITOR) 20 MG tablet Take 4 tablets (80 mg total) by mouth daily. Qty: 90 tablet, Refills: 3      CONTINUE these medications which have NOT CHANGED   Details  albuterol (PROVENTIL HFA;VENTOLIN HFA) 108 (90 Base) MCG/ACT inhaler Inhale 2 puffs into the lungs every 6 (six) hours as needed for wheezing or shortness of breath. Qty: 1 Inhaler, Refills: 4    albuterol (PROVENTIL) (2.5 MG/3ML) 0.083% nebulizer solution Take 3 mLs (2.5 mg total) by nebulization every 6 (six) hours as needed for wheezing or shortness of breath. Qty: 150 mL, Refills: 1    Carboxymeth-Glycerin-Polysorb (REFRESH OPTIVE ADVANCED) 0.5-1-0.5 % SOLN Apply 1 drop to eye 2 (two) times daily as needed (dry eyes).     citalopram (CELEXA) 10 MG tablet Take 10 mg by mouth daily.    cycloSPORINE (RESTASIS) 0.05 % ophthalmic emulsion Place 1 drop into both eyes 2 (two) times daily.    furosemide (LASIX) 20 MG tablet Take 1 tablet (20 mg total) by mouth daily. Qty: 30 tablet, Refills: 3    guaiFENesin (MUCINEX) 600  MG 12 hr tablet Take 2 tablets (1,200 mg total) by mouth 2 (two) times daily as  needed. Qty: 20 tablet, Refills: 0    Magnesium 200 MG TABS Take 1 tablet (200 mg total) by mouth daily. Qty: 30 each, Refills: 6    metFORMIN (GLUCOPHAGE) 1000 MG tablet Take 1 tablet (1,000 mg total) by mouth 2 (two) times daily with a meal. Qty: 180 tablet, Refills: 3    mometasone-formoterol (DULERA) 100-5 MCG/ACT AERO Inhale 2 puffs into the lungs 2 (two) times daily. Qty: 1 Inhaler, Refills: 4    nicotine (NICODERM CQ - DOSED IN MG/24 HOURS) 21 mg/24hr patch Place 1 patch (21 mg total) onto the skin daily. Qty: 28 patch, Refills: 0    nitroGLYCERIN (NITROSTAT) 0.4 MG SL tablet Place 1 tablet (0.4 mg total) under the tongue every 5 (five) minutes as needed for chest pain. Qty: 25 tablet, Refills: 3    Omega-3 Fatty Acids (FISH OIL) 1200 MG CAPS Take 1,200 mg by mouth 2 (two) times daily.    pantoprazole (PROTONIX) 40 MG tablet Take 1 tablet (40 mg total) by mouth daily. Qty: 30 tablet, Refills: 11    traZODone (DESYREL) 100 MG tablet Take 100 mg by mouth at bedtime.       STOP taking these medications     ibuprofen (ADVIL,MOTRIN) 600 MG tablet      isosorbide mononitrate (IMDUR) 30 MG 24 hr tablet          Aspirin prescribed at discharge?  Yes High Intensity Statin Prescribed? (Lipitor 40-80mg  or Crestor 20-40mg ): Yes Beta Blocker Prescribed? No: Bradycardia For EF <40%, was ACEI/ARB Prescribed? No: Allergy ADP Receptor Inhibitor Prescribed? (i.e. Plavix etc.-Includes Medically Managed Patients): Yes For EF <40%, Aldosterone Inhibitor Prescribed? No: EF ok Was EF assessed during THIS hospitalization? Yes Was Cardiac Rehab II ordered? (Included Medically managed Patients): Yes   Outstanding Labs/Studies   FLP/LFTs in 6 weeks if tolerating statin.   Duration of Discharge Encounter   Greater than 30 minutes including physician time.  Signed, Reino Bellis NP-C 04/28/2017, 11:08 AM     Patient seen and examined. Agree with assessment and plan. No recurrent  chest pain post PCI to thrombotic Ramus lesion yesterday. Troponin peak 16.24. Patentt diagonal stent.  Oxygen desaturation when walking with cardiac rehab earlier today. May need supplemental oxygen.  To undergo outpatient sleep apnea evaluation. Discussed smoking cessation. For dc later today.   Troy Sine, MD, [facility] 04/28/2017 11:08 AM

## 2017-04-27 NOTE — Care Management Note (Signed)
Case Management Note  Patient Details  Name: PRADEEP BEAUBRUN MRN: [id number] Date of Birth: 1966-02-07  Subjective/Objective:    From home with girlfriend, [name], pta indep.  He has Medicaid which he pays 3.50 for co pay for medications.  He is s/p coronary stent intervention,he will be on effient , this is a preferred drug on the medicaid list, so he will be able to get it for 3.50.                  Action/Plan: NCM will follow for dc needs.   Expected Discharge Date:                  Expected Discharge Plan:  Home/Self Care  In-House Referral:     Discharge planning Services  CM Consult  Post Acute Care Choice:    Choice offered to:     DME Arranged:    DME Agency:     HH Arranged:    [place] Agency:     Status of Service:  Completed, signed off  If discussed at [employer] of Stay Meetings, dates discussed:    Additional Comments:  Zenon Mayo, RN 04/27/2017, 3:18 PM

## 2017-04-27 NOTE — Progress Notes (Signed)
TR BAND REMOVAL  LOCATION:    right radial  DEFLATED PER PROTOCOL:    Yes.    TIME BAND OFF / DRESSING APPLIED:    14:45   SITE UPON ARRIVAL:    Level 0  SITE AFTER BAND REMOVAL:    Level 0  CIRCULATION SENSATION AND MOVEMENT:    Within Normal Limits   Yes.    COMMENTS:   Post TR band instructions given. Pt tolerated well.

## 2017-04-28 ENCOUNTER — Encounter (HOSPITAL_COMMUNITY): Payer: Self-pay | Admitting: Internal Medicine

## 2017-04-28 DIAGNOSIS — I25110 Atherosclerotic heart disease of native coronary artery with unstable angina pectoris: Secondary | ICD-10-CM

## 2017-04-28 DIAGNOSIS — E785 Hyperlipidemia, unspecified: Secondary | ICD-10-CM

## 2017-04-28 DIAGNOSIS — R001 Bradycardia, unspecified: Secondary | ICD-10-CM

## 2017-04-28 DIAGNOSIS — I10 Essential (primary) hypertension: Secondary | ICD-10-CM

## 2017-04-28 DIAGNOSIS — G4733 Obstructive sleep apnea (adult) (pediatric): Secondary | ICD-10-CM

## 2017-04-28 DIAGNOSIS — G4730 Sleep apnea, unspecified: Secondary | ICD-10-CM

## 2017-04-28 DIAGNOSIS — F17200 Nicotine dependence, unspecified, uncomplicated: Secondary | ICD-10-CM

## 2017-04-28 HISTORY — DX: Bradycardia, unspecified: R00.1

## 2017-04-28 LAB — GLUCOSE, CAPILLARY
GLUCOSE-CAPILLARY: 128 mg/dL — AB (ref 65–99)
Glucose-Capillary: 147 mg/dL — ABNORMAL HIGH (ref 65–99)

## 2017-04-28 LAB — CBC
HCT: 42.4 % (ref 39.0–52.0)
HEMOGLOBIN: 12.7 g/dL — AB (ref 13.0–17.0)
MCH: 24.6 pg — ABNORMAL LOW (ref 26.0–34.0)
MCHC: 30 g/dL (ref 30.0–36.0)
MCV: 82 fL (ref 78.0–100.0)
Platelets: 223 10*3/uL (ref 150–400)
RBC: 5.17 MIL/uL (ref 4.22–5.81)
RDW: 16.9 % — ABNORMAL HIGH (ref 11.5–15.5)
WBC: 9.6 10*3/uL (ref 4.0–10.5)

## 2017-04-28 LAB — BASIC METABOLIC PANEL
ANION GAP: 8 (ref 5–15)
BUN: <5 mg/dL — ABNORMAL LOW (ref 6–20)
CALCIUM: 8.8 mg/dL — AB (ref 8.9–10.3)
CO2: 30 mmol/L (ref 22–32)
Chloride: 103 mmol/L (ref 101–111)
Creatinine, Ser: 0.74 mg/dL (ref 0.61–1.24)
GFR calc Af Amer: >60 mL/min (ref >60)
Glucose, Bld: 133 mg/dL — ABNORMAL HIGH (ref 65–99)
POTASSIUM: 4.4 mmol/L (ref 3.5–5.1)
Sodium: 141 mmol/L (ref 135–145)

## 2017-04-28 MED ORDER — ATORVASTATIN CALCIUM 20 MG PO TABS: 80.0000 mg | ORAL_TABLET | Freq: Every day | ORAL | 3 refills | Status: DC

## 2017-04-28 MED ORDER — PRASUGREL HCL 10 MG PO TABS: 10.0000 mg | ORAL_TABLET | Freq: Every day | ORAL | 1 refills | Status: DC

## 2017-04-28 MED ORDER — AMLODIPINE BESYLATE 10 MG PO TABS: 5.0000 mg | ORAL_TABLET | Freq: Every day | ORAL | 4 refills | Status: DC

## 2017-04-28 MED ORDER — ANGIOPLASTY BOOK
Freq: Once | Status: AC
  Administered 2017-04-28: 06:00:00 1
  Filled 2017-04-28: qty 1

## 2017-04-28 MED ORDER — ASPIRIN 81 MG PO TBEC
81.0000 mg | DELAYED_RELEASE_TABLET | Freq: Every day | ORAL | Status: DC
Start: 2017-04-29 — End: 2023-10-14

## 2017-04-28 MED ORDER — MENTHOL 3 MG MT LOZG
1.0000 | LOZENGE | OROMUCOSAL | Status: DC | PRN
  Administered 2017-04-28: 3 mg via ORAL
  Filled 2017-04-28: qty 9

## 2017-04-28 NOTE — Care Management Note (Signed)
Case Management Note  Patient Details  Name: Randy Hebert MRN: [id number] Date of Birth: 11/18/1965  Subjective/Objective:    From home with girlfriend, [name], pta indep.  He has Medicaid which he pays 3.50 for co pay for medications.  He is s/p coronary stent intervention,he will be on effient , this is a preferred drug on the medicaid list, so he will be able to get it for 3.50.  8/28 [place], BSN - Per Cardiac rehab ,patient will need home oxygen, he stated he would like to work with [facility], referral given to [facility] with [facility], will need DME order for home oxygen.                  Action/Plan: NCM will follow for dc needs.   Expected Discharge Date:                  Expected Discharge Plan:  Home/Self Care  In-House Referral:     Discharge planning Services  CM Consult  Post Acute Care Choice:    Choice offered to:     DME Arranged:    DME Agency:     HH Arranged:    [place] Agency:     Status of Service:  Completed, signed off  If discussed at [employer] of Stay Meetings, dates discussed:    Additional Comments:  Zenon Mayo, RN 04/28/2017, 10:50 AM

## 2017-04-28 NOTE — Progress Notes (Signed)
CARDIAC REHAB PHASE I   PRE:  Rate/Rhythm: 91 SR  BP:  Sitting: 138/93        SaO2: 92 RA  MODE:  Ambulation: 300 + 400 ft = 700 ft total   POST:  Rate/Rhythm: 103 ST  BP:  Sitting: 121/75         SaO2: 91-93 on RA at rest, 86 on RA c/ ambulation, 93 on 2L O2 with ambulation  Pt ambulated twice (first without oxygen, O2 sats decreased to 86% on RA, then again with 2L O2, for a total of 700 ft) independent, steady gait, tolerated fairly well.  Pt c/o DOE, hip pain with distance, denies cp, dizziness, standing rest x2 (one rest break during each walk). Pt requiring 2L O2 to maintain sats during ambulation. Completed MI/stent education with pt and friend, Randy Hebert, at bedside. Reviewed risk factors, tobacco cessation (gave pt fake cigarette), PCI/MI book, anti-platelet therapy, stent card, activity restrictions, ntg, exercise, heart healthy and diabetes diet handouts, chf monitoring, and phase 2 cardiac rehab. Pt verbalized understanding, receptive to education. Pt agrees to phase 2 cardiac rehab referral, will send to [place] per pt request. Pt to chair after walk, call bell within reach.  [phone] Randy Sciara, RN, BSN 04/28/2017 10:19 AM

## 2017-04-28 NOTE — Progress Notes (Signed)
Respiratory therapist paged for evaluation and treatment of pt's sleep apnea. Pt's wife states that he does this every night and that she counts how many episodes he has. Along with the sleep apnea pt's HR drops to 30's and 40's with a rare pause. Dr. Aundra Dubin made aware.Marland Kitchen

## 2017-04-28 NOTE — Progress Notes (Signed)
Pt placed on NIV for undiagnosed sleep apnea. RRT observed patient HR significant dropped due to sleep apnea pauses while asleep.  Pt had gasping respirations on arrival. Shallow gasping breaths followed by huge deep snoring breaths. Pt SATs were in the low 70's and patient was difficult to arouse. RN aware. Pt tolerating NIV machine well at this time.

## 2017-04-28 NOTE — Progress Notes (Signed)
Pt off bipap and on RA at this time. VS within normal limits. Pt denies SOB, no increased WOB. RT will continue to monitor.

## 2017-04-28 NOTE — Progress Notes (Signed)
SATURATION QUALIFICATIONS: (This note is used to comply with regulatory documentation for home oxygen)  Patient Saturations on Room Air at Rest = 91%  Patient Saturations on Room Air while Ambulating = 86%  Patient Saturations on 2 Liters of oxygen while Ambulating = 93%  Please briefly explain why patient needs home oxygen: Pt requiring 2 L oxygen to maintain oxygen saturation during ambulation.

## 2017-05-07 ENCOUNTER — Ambulatory Visit (INDEPENDENT_AMBULATORY_CARE_PROVIDER_SITE_OTHER): Payer: Medicaid Other | Admitting: Family Medicine

## 2017-05-07 ENCOUNTER — Encounter: Payer: Self-pay | Admitting: Family Medicine

## 2017-05-07 VITALS — BP 126/76 | HR 88 | Temp 99.1°F | Resp 28 | Ht 66.0 in | Wt 232.0 lb

## 2017-05-07 DIAGNOSIS — I10 Essential (primary) hypertension: Secondary | ICD-10-CM | POA: Diagnosis not present

## 2017-05-07 DIAGNOSIS — I2510 Atherosclerotic heart disease of native coronary artery without angina pectoris: Secondary | ICD-10-CM

## 2017-05-07 DIAGNOSIS — E119 Type 2 diabetes mellitus without complications: Secondary | ICD-10-CM

## 2017-05-07 DIAGNOSIS — Z23 Encounter for immunization: Secondary | ICD-10-CM

## 2017-05-07 DIAGNOSIS — J449 Chronic obstructive pulmonary disease, unspecified: Secondary | ICD-10-CM | POA: Diagnosis not present

## 2017-05-07 DIAGNOSIS — F17200 Nicotine dependence, unspecified, uncomplicated: Secondary | ICD-10-CM

## 2017-05-07 DIAGNOSIS — G4730 Sleep apnea, unspecified: Secondary | ICD-10-CM | POA: Diagnosis not present

## 2017-05-07 NOTE — Progress Notes (Signed)
Chief Complaint  Patient presents with  . Follow-up   Just out of hospital He had another heart attack He developed chest pain while smoking Amazingly, he smoked a cigarette in the car on the way home He blames his "nerves" I had a very strong talk with him.  It is a life or death decision for him.  He needs to [place].  I listened to a litany of excuses and again reinforced he needs ZERO cigarettes to survive. We reconciled his medicines He is free of chest pain He is not active - and is encouraged to discuss rehab with his cardiologist A sleep study is ordered.  He had sleep apnea observed in the hospital.    Patient Active Problem List   Diagnosis Date Noted  . Bradycardia 04/28/2017  . Sleep apnea 04/28/2017  . Non-ST elevation (NSTEMI) myocardial infarction ([place]) 04/27/2017  . NSTEMI (non-ST elevated myocardial infarction) ([place]) 04/26/2017  . Substance abuse 04/15/2017  . Depression with anxiety 04/15/2017  . GERD (gastroesophageal reflux disease) 09/23/2016  . 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 Type 2 diabetes mellitus without complication, without long-term current use of insulin ([place]) 04/26/2016  . Chronic obstructive pulmonary disease ([place]) 08/27/2015  . Cigarette nicotine dependence with nicotine-induced disorder 08/27/2015  . Hyperlipidemia 08/27/2015  . Obesity, unspecified 08/27/2015  . Acute on chronic systolic heart failure ([place]) 06/02/2015  . ACE inhibitor-aggravated angioedema   . Chronic systolic CHF (congestive heart failure) ([place]) 06/01/2015  . CAD (coronary artery disease) 05/31/2015  . STEMI (ST elevation myocardial infarction) ([place]) 05/31/2015  . Tobacco use disorder 05/31/2015  . Essential hypertension 02/09/2015    Outpatient Encounter Prescriptions as of 05/07/2017  Medication Sig  . albuterol (PROVENTIL HFA;VENTOLIN HFA) 108 (90 Base) MCG/ACT inhaler Inhale 2 puffs into the lungs every 6 (six) hours as needed for wheezing or  shortness of breath.  Marland Kitchen albuterol (PROVENTIL) (2.5 MG/3ML) 0.083% nebulizer solution Take 3 mLs (2.5 mg total) by nebulization every 6 (six) hours as needed for wheezing or shortness of breath.  Marland Kitchen amLODipine (NORVASC) 10 MG tablet Take 0.5 tablets (5 mg total) by mouth daily.  Marland Kitchen aspirin 81 MG EC tablet Take 1 tablet (81 mg total) by mouth daily.  Marland Kitchen atorvastatin (LIPITOR) 80 MG tablet Take 80 mg by mouth daily.  . Carboxymeth-Glycerin-Polysorb (REFRESH OPTIVE ADVANCED) 0.5-1-0.5 % SOLN Apply 1 drop to eye 2 (two) times daily as needed (dry eyes).   . citalopram (CELEXA) 10 MG tablet Take 10 mg by mouth daily.  . cycloSPORINE (RESTASIS) 0.05 % ophthalmic emulsion Place 1 drop into both eyes 2 (two) times daily.  . furosemide (LASIX) 20 MG tablet Take 1 tablet (20 mg total) by mouth daily.  Marland Kitchen guaiFENesin (MUCINEX) 600 MG 12 hr tablet Take 2 tablets (1,200 mg total) by mouth 2 (two) times daily as needed.  . Magnesium 200 MG TABS Take 1 tablet (200 mg total) by mouth daily.  . metFORMIN (GLUCOPHAGE) 1000 MG tablet Take 1 tablet (1,000 mg total) by mouth 2 (two) times daily with a meal.  . mometasone-formoterol (DULERA) 100-5 MCG/ACT AERO Inhale 2 puffs into the lungs 2 (two) times daily.  . nicotine (NICODERM CQ - DOSED IN MG/24 HOURS) 21 mg/24hr patch Place 1 patch (21 mg total) onto the skin daily.  . nitroGLYCERIN (NITROSTAT) 0.4 MG SL tablet Place 1 tablet (0.4 mg total) under the tongue every 5 (five) minutes as needed for chest pain.  . Omega-3 Fatty  Acids (FISH OIL) 1200 MG CAPS Take 1,200 mg by mouth 2 (two) times daily.  . pantoprazole (PROTONIX) 40 MG tablet Take 1 tablet (40 mg total) by mouth daily.  . prasugrel (EFFIENT) 10 MG TABS tablet Take 1 tablet (10 mg total) by mouth daily.  . traZODone (DESYREL) 100 MG tablet Take 100 mg by mouth at bedtime.    No facility-administered encounter medications on file as of 05/07/2017.     Allergies  Allergen Reactions  . Carvedilol Other (See  Comments)    Sinus pause on telemetry >3 seconds. Longest one 9 sec. No AV nodal agent  . Lisinopril Anaphylaxis, Shortness Of Breath and Swelling    Angioedema, required intubation and mechanical ventilation  . Amoxicillin Nausea And Vomiting    Nausea only - no allergy     Review of Systems  Constitutional: Positive for fatigue. Negative for diaphoresis and unexpected weight change.  HENT: Negative for congestion and dental problem.   Eyes: Negative for photophobia and visual disturbance.  Respiratory: Positive for cough and shortness of breath. Negative for wheezing.   Cardiovascular: Positive for leg swelling. Negative for chest pain and palpitations.  Gastrointestinal: Negative for constipation and diarrhea.  Genitourinary: Positive for frequency. Negative for dysuria.  Neurological: Negative for dizziness and headaches.  Psychiatric/Behavioral: Positive for dysphoric mood and sleep disturbance. The patient is nervous/anxious.     BP 126/76 (BP Location: Right Arm, Patient Position: Sitting, Cuff Size: Large)   Pulse 88   Temp 99.1 F (37.3 C) (Temporal)   Resp (!) 28   Ht 5\' 6"  (1.676 m)   Wt 232 lb 0.6 oz (105.3 kg)   SpO2 90%   BMI 37.45 kg/m   Physical Exam  Constitutional: He is oriented to person, place, and time. He appears well-developed and well-nourished.  HENT:  Head: Normocephalic and atraumatic.  Mouth/Throat: Oropharynx is clear and moist.  Both pinna deformed and thickened  Eyes: Pupils are equal, round, and reactive to light. Conjunctivae are normal.  Neck: Normal range of motion. Neck supple. No thyromegaly present.  Cardiovascular: Normal rate, regular rhythm and normal heart sounds.   Pulmonary/Chest: He is in respiratory distress. He has no wheezes. He has no rales.  Dyspneic at rest 22-24, cannot complete long sentence.    Abdominal: Soft. Bowel sounds are normal.  Musculoskeletal: Normal range of motion. He exhibits edema.  trace    Lymphadenopathy:    He has no cervical adenopathy.  Neurological: He is alert and oriented to person, place, and time.  Gait normal  Skin: Skin is warm and dry.  Psychiatric: He has a normal mood and affect. His behavior is normal.  Poor judgement  Nursing note and vitals reviewed.   ASSESSMENT/PLAN:  1. Need for influenza vaccination - Flu Vaccine QUAD 36+ mos IM  2. Coronary artery disease involving native coronary artery of native heart without angina pectoris Recent MI  3. Essential hypertension controlled  4. Chronic obstructive pulmonary disease, unspecified COPD type ([place]) - Nocturnal polysomnography (NPSG); Future  5. Type 2 diabetes mellitus without complication, without long-term current use of insulin ([place])   6. Sleep-disordered breathing  - Nocturnal polysomnography (NPSG); Future  7. Tobacco use disorder  Greater than 50% of this visit was spent in counseling and coordinating care.  Total face to face time:  40 min spent in CVD risk modification, diet, smoking, review of hospital tests and results   Patient Instructions  STOP SMOKING, this means ZERO  Gradually increase activity  See Cardiology on Tuesday the 11 th  Continue with the heart healthy diet  See me in a month    Raylene Everts, MD

## 2017-05-07 NOTE — Patient Instructions (Addendum)
STOP SMOKING, this means ZERO  Gradually increase activity See Cardiology on Tuesday the 11 th  Continue with the heart healthy diet  See me in a month

## 2017-05-12 ENCOUNTER — Encounter: Payer: Self-pay | Admitting: Adult Health

## 2017-05-12 ENCOUNTER — Ambulatory Visit (INDEPENDENT_AMBULATORY_CARE_PROVIDER_SITE_OTHER): Payer: Medicaid Other | Admitting: Adult Health

## 2017-05-12 VITALS — BP 124/68 | HR 92 | Wt 231.0 lb

## 2017-05-12 DIAGNOSIS — I43 Cardiomyopathy in diseases classified elsewhere: Secondary | ICD-10-CM | POA: Diagnosis not present

## 2017-05-12 DIAGNOSIS — I10 Essential (primary) hypertension: Secondary | ICD-10-CM

## 2017-05-12 DIAGNOSIS — J410 Simple chronic bronchitis: Secondary | ICD-10-CM

## 2017-05-12 DIAGNOSIS — Z720 Tobacco use: Secondary | ICD-10-CM | POA: Diagnosis not present

## 2017-05-12 DIAGNOSIS — I2510 Atherosclerotic heart disease of native coronary artery without angina pectoris: Secondary | ICD-10-CM | POA: Diagnosis not present

## 2017-05-12 NOTE — Patient Instructions (Signed)
Medication Instructions:  Your physician recommends that you continue on your current medications as directed. Please refer to the Current Medication list given to you today.   Labwork: NONE   Testing/Procedures: NONE   Follow-Up: Your physician recommends that you schedule a follow-up appointment in: 3 Months    Any Other Special Instructions Will Be Listed Below (If Applicable).     If you need a refill on your cardiac medications before your next appointment, please call your pharmacy.  Thank you for choosing [facility]!   

## 2017-05-12 NOTE — Progress Notes (Signed)
Cardiology Office Note   Date:  05/12/2017   ID:  Randy Hebert, DOB June 20, 1966, MRN [id number]  PCP:  Raylene Everts, MD  Cardiologist:  [facility] Chief Complaint  Patient presents with  . Hospitalization Follow-up  . Coronary Artery Disease      History of Present Illness: Randy Hebert is a 51 y.o. male who presents for posthospitalization follow-up after admission for chest pain, undergoing left heart catheterization on 04/27/2017, this revealed single-vessel coronary artery disease with successful PCI to the proximal ramus intermedius and placement of science drug-eluting stent.   Other history includes morbid obesity, COPD, ongoing tobacco abuse, hypertension, diabetes type 2, hyperlipidemia, undiagnosed OSA. He is here for post hospitalization follow-up. He was not placed on beta blockers due to bradycardia, nor was he placed on ACE or ARB in the setting of angioedema. He was counseled on smoking cessation. Amlodipine was also held due to hypotension.  He has been placed on oxygen 2 L via nasal cannula which he is to wear at all times, the patient has been taking on and off to continue to smoke. He denies any recurrent chest pain. The patient has a medically compliant. He is interested in cardiac rehabilitation.  Past Medical History:  Diagnosis Date  . Allergy   . Anxiety   . Arthritis   . Asthma    hip replacement  . Back pain   . Bulging of cervical intervertebral disc   . CAD (coronary artery disease)    lateral STEMI 02/06/2015 00% D1 occlusion treated with Promus Premier 2.5 mm x 16 mm DES, 70% ramus stenosis, 40% mid RCA stenosis, 45% distal RCA stenosis, EF 45-50%  . CHF (congestive heart failure) ([place])   . COPD (chronic obstructive pulmonary disease) ([place])   . Depression   . Diabetes mellitus without complication ([place])   . 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 Dry eye   . Dyspnea   . Early satiety 09/23/2016  . GERD  (gastroesophageal reflux disease)   . Headache   . Heart murmur   . Hip pain   . Hyperlipidemia   . Hypertension   . MI (myocardial infarction) ([place])   . Myocardial infarction ([place])   . Neck pain   . Otitis media   . Pleurisy   . Sinus pause    9 sec sinus pause on telemetry after started on coreg after MI, avoid AV nodal blocking agent  . Sleep apnea   . Substance abuse    alcoholic  . Unilateral primary osteoarthritis, right hip 07/01/2016    Past Surgical History:  Procedure Laterality Date  . BIOPSY  10/09/2016   Procedure: BIOPSY;  Surgeon: Daneil Dolin, MD;  [facility];  Service: Endoscopy;;  . CARDIAC CATHETERIZATION N/A 02/06/2015   Procedure: Left Heart Cath and Coronary Angiography;  Surgeon: Leonie Man, MD;  Location: [place] [facility];  Service: Cardiovascular;  Laterality: N/A;  . CARDIAC CATHETERIZATION N/A 02/06/2015   Procedure: Coronary Stent Intervention;  Surgeon: Leonie Man, MD;  Location: [place] [facility];  Service: Cardiovascular;  Laterality: N/A;  . COLONOSCOPY WITH PROPOFOL N/A 10/09/2016   Procedure: COLONOSCOPY WITH PROPOFOL;  Surgeon: Daneil Dolin, MD;  [facility];  Service: Endoscopy;  Laterality: N/A;  12:00 pm  . COLONOSCOPY WITH PROPOFOL N/A 11/24/2016   Procedure: COLONOSCOPY WITH PROPOFOL;  Surgeon: Daneil Dolin, MD;  [facility];  Service: Endoscopy;  Laterality: N/A;  200  . CORONARY ANGIOPLASTY WITH STENT PLACEMENT  01/2015  . CORONARY STENT INTERVENTION N/A 04/27/2017   Procedure: CORONARY STENT INTERVENTION;  Surgeon: Nelva Bush, MD;  Location: [place] [facility];  Service: Cardiovascular;  Laterality: N/A;  . ESOPHAGOGASTRODUODENOSCOPY (EGD) WITH PROPOFOL N/A 10/09/2016   Procedure: ESOPHAGOGASTRODUODENOSCOPY (EGD) WITH PROPOFOL;  Surgeon: Daneil Dolin, MD;  [facility];  Service: Endoscopy;  Laterality: N/A;  . INCISION / DRAINAGE HAND / FINGER    . LEFT HEART CATH AND  CORONARY ANGIOGRAPHY N/A 04/27/2017   Procedure: LEFT HEART CATH AND CORONARY ANGIOGRAPHY;  Surgeon: Nelva Bush, MD;  Location: [place] [facility];  Service: Cardiovascular;  Laterality: N/A;  . POLYPECTOMY  11/24/2016   Procedure: POLYPECTOMY;  Surgeon: Daneil Dolin, MD;  [facility];  Service: Endoscopy;;  descending and sigmoid  . TOTAL HIP ARTHROPLASTY Right 07/01/2016  . TOTAL HIP ARTHROPLASTY Right 07/01/2016   Procedure: RIGHT TOTAL HIP ARTHROPLASTY ANTERIOR APPROACH;  Surgeon: Mcarthur Rossetti, MD;  Location: [place];  Service: Orthopedics;  Laterality: Right;     Current Outpatient Prescriptions  Medication Sig Dispense Refill  . albuterol (PROVENTIL HFA;VENTOLIN HFA) 108 (90 Base) MCG/ACT inhaler Inhale 2 puffs into the lungs every 6 (six) hours as needed for wheezing or shortness of breath. 1 Inhaler 4  . albuterol (PROVENTIL) (2.5 MG/3ML) 0.083% nebulizer solution Take 3 mLs (2.5 mg total) by nebulization every 6 (six) hours as needed for wheezing or shortness of breath. 150 mL 1  . amLODipine (NORVASC) 10 MG tablet Take 0.5 tablets (5 mg total) by mouth daily. 30 tablet 4  . aspirin 81 MG EC tablet Take 1 tablet (81 mg total) by mouth daily. 30 tablet   . atorvastatin (LIPITOR) 80 MG tablet Take 80 mg by mouth daily.    . Carboxymeth-Glycerin-Polysorb (REFRESH OPTIVE ADVANCED) 0.5-1-0.5 % SOLN Apply 1 drop to eye 2 (two) times daily as needed (dry eyes).     . citalopram (CELEXA) 10 MG tablet Take 10 mg by mouth daily.    . cycloSPORINE (RESTASIS) 0.05 % ophthalmic emulsion Place 1 drop into both eyes 2 (two) times daily.    . furosemide (LASIX) 20 MG tablet Take 1 tablet (20 mg total) by mouth daily. 30 tablet 3  . guaiFENesin (MUCINEX) 600 MG 12 hr tablet Take 2 tablets (1,200 mg total) by mouth 2 (two) times daily as needed. 20 tablet 0  . Magnesium 200 MG TABS Take 1 tablet (200 mg total) by mouth daily. 30 each 6  . metFORMIN (GLUCOPHAGE) 1000 MG tablet  Take 1 tablet (1,000 mg total) by mouth 2 (two) times daily with a meal. 180 tablet 3  . mometasone-formoterol (DULERA) 100-5 MCG/ACT AERO Inhale 2 puffs into the lungs 2 (two) times daily. 1 Inhaler 4  . nicotine (NICODERM CQ - DOSED IN MG/24 HOURS) 21 mg/24hr patch Place 1 patch (21 mg total) onto the skin daily. 28 patch 0  . nitroGLYCERIN (NITROSTAT) 0.4 MG SL tablet Place 1 tablet (0.4 mg total) under the tongue every 5 (five) minutes as needed for chest pain. 25 tablet 3  . Omega-3 Fatty Acids (FISH OIL) 1200 MG CAPS Take 1,200 mg by mouth 2 (two) times daily.    . pantoprazole (PROTONIX) 40 MG tablet Take 1 tablet (40 mg total) by mouth daily. 30 tablet 11  . prasugrel (EFFIENT) 10 MG TABS tablet Take 1 tablet (10 mg total) by mouth  daily. 90 tablet 1  . traZODone (DESYREL) 100 MG tablet Take 100 mg by mouth at bedtime.      No current facility-administered medications for this visit.     Allergies:   Carvedilol; Lisinopril; and Amoxicillin    Social History:  The patient  reports that he has been smoking Cigarettes.  He started smoking about 37 years ago. He has a 46.00 pack-year smoking history. He has quit using smokeless tobacco. His smokeless tobacco use included Chew. He reports that he drinks about 6.6 - 7.2 oz of alcohol per week . He reports that he does not use drugs.   Family History:  The patient's family history includes Alzheimer's disease in his maternal grandmother; Arthritis in his father and mother; Cancer in his mother; Diabetes in his maternal uncle; Early death in his brother; Heart attack in his father; Heart disease in his father; Heart disease (age of onset: 42) in his brother; Stroke in his father.    ROS: All other systems are reviewed and negative. Unless otherwise mentioned in H&P    PHYSICAL EXAM: VS:  BP 124/68   Pulse 92   Wt 231 lb (104.8 kg)   SpO2 91%   BMI 37.28 kg/m  , BMI Body mass index is 37.28 kg/m. GEN: Well nourished, well developed, in  no acute distress  HEENT: normal  Neck: no JVD, carotid bruits, or masses Cardiac: RRR; no murmurs, rubs, or gallops,no edema  Respiratory: Some respiratory wheezes, no coughing. GI: soft, nontender, nondistended, + BS MS: no deformity or atrophy  Skin: warm and dry, no rash Neuro:  Strength and sensation are intact Psych: euthymic mood, full affect   Recent Labs: 04/15/2017: ALT 38; Brain Natriuretic Peptide 32.6 04/26/2017: TSH 1.635 04/28/2017: BUN <5; Creatinine, Ser 0.74; Hemoglobin 12.7; Platelets 223; Potassium 4.4; Sodium 141    Lipid Panel    Component Value Date/Time   CHOL 93 04/27/2017 0505   TRIG 206 (H) 04/27/2017 0505   HDL 18 (L) 04/27/2017 0505   CHOLHDL 5.2 04/27/2017 0505   VLDL 41 (H) 04/27/2017 0505   LDLCALC 34 04/27/2017 0505      Wt Readings from Last 3 Encounters:  05/12/17 231 lb (104.8 kg)  05/07/17 232 lb 0.6 oz (105.3 kg)  04/28/17 224 lb 13.9 oz (102 kg)      Other studies Reviewed: Cardiac Cath 04/27/2017 Conclusion   Conclusions: 1. Severe single-vessel coronary artery disease with subtotal thrombotic occlusion of proximal ramus intermedius with TIMI-1 flow. 2. Mild to moderate disease involving the LAD and RCA, which are large and ectatic vessels. Previously placed diagonal stent is widely patent. 3. Moderately reduced LVEF with mid and apical anterolateral hypokinesis. 4. Mildly elevated left ventricular filling pressure. 5. Successful PCI to proximal ramus intermedius with placement of a Xience Sierra 2.75 x 15 mm drug-eluting stent (post dilated to 3.1 mm) with 0% residual stenosis and TIMI-3 flow.   Echocardiogram 04/27/2017 Left ventricle: The cavity size was normal. There was mild   concentric hypertrophy. Systolic function was mildly to   moderately reduced. The estimated ejection fraction was in the   range of 40% to 45%. Diffuse hypokinesis. Doppler parameters are   consistent with abnormal left ventricular relaxation (grade 1    diastolic dysfunction). Doppler parameters are consistent with   elevated ventricular end-diastolic filling pressure. - Ventricular septum: Septal motion showed paradox. - Aortic valve: Trileaflet; normal thickness leaflets. There was no   regurgitation. - Mitral valve: Structurally normal valve. There  was trivial   regurgitation. - Right ventricle: The cavity size was moderately dilated. Wall   thickness was normal. Systolic function was moderately reduced. - Right atrium: The atrium was normal in size. - Tricuspid valve: There was trivial regurgitation. - Pulmonary arteries: Systolic pressure was within the normal   range. - Inferior vena cava: The vessel was dilated. The respirophasic   diameter changes were blunted (< 50%), consistent with elevated   central venous pressure. - Pericardium, extracardiac: There was no pericardial effusion.  ASSESSMENT AND PLAN:  1.  CAD: Status post cardiac catheterization with stent placement to the proximal ramus intermedius, with 40% residual LAD stenosis, with no stenosis found within a previously placed diagonal stent. The patient is feeling better. He is committed to stopping smoking. He is down to 5 cigarettes a day as opposed to 2 packs a day. He is interested in going to cardiac rehabilitation. He is able to afford his medications and he is medically compliant. I will refer him to cardiac rehabilitation. He is to see [place] again in 3 months unless symptomatic. I have not started him on beta blocker at this time. He did have episodes of bradycardia as documented in discharge summary. Heart rate is in the 80s now. May consider adding low-dose beta blocker on follow-up appointment. Blood pressures currently well-controlled.  2. Moderately reduced LV systolic function: Per echocardiogram LVEF 40-45%. He is not on ARB or ACE inhibitor as he is allergic to them causing angioedema. As stated above beta blocker has been held off in the setting of sinus pauses  and bradycardia. The patient remains on Lasix 20 mg by mouth daily. Most recent labs on 04/28/2017 have creatinine of 0.74. May need to consider digoxin for heart rate control.  3, Hypertension: Currently well-controlled. Would like to see at lower with reduced EF. We'll follow this closely. Try and optimize medical therapy. He is currently on amlodipine 5 mg daily.  4. O2 dependent COPD: He is only wearing his oxygen intermittently. I've advised him to please wear it as directed as this will reduce strain on his heart and improve his breathing status. He states that he was to be referred for a sleep study as he was found to have sleep apnea during recent hospitalization. Primary care physician is arranging.  5. Ongoing tobacco abuse: The patient has reduced his smoking habits from 2 packs a day to 5 cigarettes a day. He is working hard at trying not to smoke. Most of his triggers occur while driving. He is committed to stopping.    Current medicines are reviewed at length with the patient today.    Labs/ tests ordered today include: Phill Myron. [place], ANP, AACC   05/12/2017 4:39 PM    [facility] [address] [place], [place] [address] Phone: [phone]; Fax: [phone]

## 2017-05-14 ENCOUNTER — Encounter (HOSPITAL_COMMUNITY): Payer: Self-pay | Admitting: *Deleted

## 2017-05-14 ENCOUNTER — Inpatient Hospital Stay (HOSPITAL_COMMUNITY)
Admission: EM | Admit: 2017-05-14 | Discharge: 2017-05-18 | DRG: 193 | Disposition: A | Discharge status: Home / Self Care | Payer: No Typology Code available for payment source | Attending: Internal Medicine | Admitting: Internal Medicine

## 2017-05-14 ENCOUNTER — Emergency Department (HOSPITAL_COMMUNITY): Payer: No Typology Code available for payment source

## 2017-05-14 DIAGNOSIS — E11649 Type 2 diabetes mellitus with hypoglycemia without coma: Secondary | ICD-10-CM | POA: Diagnosis not present

## 2017-05-14 DIAGNOSIS — K219 Gastro-esophageal reflux disease without esophagitis: Secondary | ICD-10-CM | POA: Diagnosis present

## 2017-05-14 DIAGNOSIS — Z8261 Family history of arthritis: Secondary | ICD-10-CM

## 2017-05-14 DIAGNOSIS — I255 Ischemic cardiomyopathy: Secondary | ICD-10-CM | POA: Diagnosis present

## 2017-05-14 DIAGNOSIS — F17210 Nicotine dependence, cigarettes, uncomplicated: Secondary | ICD-10-CM | POA: Diagnosis present

## 2017-05-14 DIAGNOSIS — I455 Other specified heart block: Secondary | ICD-10-CM

## 2017-05-14 DIAGNOSIS — Z7982 Long term (current) use of aspirin: Secondary | ICD-10-CM

## 2017-05-14 DIAGNOSIS — Z7984 Long term (current) use of oral hypoglycemic drugs: Secondary | ICD-10-CM

## 2017-05-14 DIAGNOSIS — J189 Pneumonia, unspecified organism: Principal | ICD-10-CM | POA: Diagnosis present

## 2017-05-14 DIAGNOSIS — J181 Lobar pneumonia, unspecified organism: Secondary | ICD-10-CM

## 2017-05-14 DIAGNOSIS — E1165 Type 2 diabetes mellitus with hyperglycemia: Secondary | ICD-10-CM

## 2017-05-14 DIAGNOSIS — Z96641 Presence of right artificial hip joint: Secondary | ICD-10-CM | POA: Diagnosis present

## 2017-05-14 DIAGNOSIS — I2510 Atherosclerotic heart disease of native coronary artery without angina pectoris: Secondary | ICD-10-CM | POA: Diagnosis present

## 2017-05-14 DIAGNOSIS — I4510 Unspecified right bundle-branch block: Secondary | ICD-10-CM | POA: Diagnosis present

## 2017-05-14 DIAGNOSIS — Y92410 Unspecified street and highway as the place of occurrence of the external cause: Secondary | ICD-10-CM

## 2017-05-14 DIAGNOSIS — J9621 Acute and chronic respiratory failure with hypoxia: Secondary | ICD-10-CM

## 2017-05-14 DIAGNOSIS — Z823 Family history of stroke: Secondary | ICD-10-CM

## 2017-05-14 DIAGNOSIS — I110 Hypertensive heart disease with heart failure: Secondary | ICD-10-CM | POA: Diagnosis present

## 2017-05-14 DIAGNOSIS — G4733 Obstructive sleep apnea (adult) (pediatric): Secondary | ICD-10-CM | POA: Diagnosis present

## 2017-05-14 DIAGNOSIS — Z9119 Patient's noncompliance with other medical treatment and regimen: Secondary | ICD-10-CM

## 2017-05-14 DIAGNOSIS — Z8249 Family history of ischemic heart disease and other diseases of the circulatory system: Secondary | ICD-10-CM

## 2017-05-14 DIAGNOSIS — M542 Cervicalgia: Secondary | ICD-10-CM | POA: Diagnosis present

## 2017-05-14 DIAGNOSIS — J4489 Other specified chronic obstructive pulmonary disease: Secondary | ICD-10-CM | POA: Diagnosis present

## 2017-05-14 DIAGNOSIS — Z955 Presence of coronary angioplasty implant and graft: Secondary | ICD-10-CM

## 2017-05-14 DIAGNOSIS — E782 Mixed hyperlipidemia: Secondary | ICD-10-CM | POA: Diagnosis present

## 2017-05-14 DIAGNOSIS — Z6837 Body mass index (BMI) 37.0-37.9, adult: Secondary | ICD-10-CM

## 2017-05-14 DIAGNOSIS — Z79899 Other long term (current) drug therapy: Secondary | ICD-10-CM

## 2017-05-14 DIAGNOSIS — J9601 Acute respiratory failure with hypoxia: Secondary | ICD-10-CM

## 2017-05-14 DIAGNOSIS — E785 Hyperlipidemia, unspecified: Secondary | ICD-10-CM | POA: Diagnosis present

## 2017-05-14 DIAGNOSIS — I5042 Chronic combined systolic (congestive) and diastolic (congestive) heart failure: Secondary | ICD-10-CM

## 2017-05-14 DIAGNOSIS — J449 Chronic obstructive pulmonary disease, unspecified: Secondary | ICD-10-CM | POA: Diagnosis present

## 2017-05-14 DIAGNOSIS — I10 Essential (primary) hypertension: Secondary | ICD-10-CM | POA: Diagnosis present

## 2017-05-14 DIAGNOSIS — I252 Old myocardial infarction: Secondary | ICD-10-CM

## 2017-05-14 DIAGNOSIS — Y95 Nosocomial condition: Secondary | ICD-10-CM | POA: Diagnosis present

## 2017-05-14 DIAGNOSIS — J440 Chronic obstructive pulmonary disease with acute lower respiratory infection: Secondary | ICD-10-CM | POA: Diagnosis present

## 2017-05-14 DIAGNOSIS — G4700 Insomnia, unspecified: Secondary | ICD-10-CM | POA: Diagnosis present

## 2017-05-14 DIAGNOSIS — F17200 Nicotine dependence, unspecified, uncomplicated: Secondary | ICD-10-CM | POA: Diagnosis present

## 2017-05-14 DIAGNOSIS — R55 Syncope and collapse: Secondary | ICD-10-CM

## 2017-05-14 LAB — BASIC METABOLIC PANEL
Anion gap: 6 (ref 5–15)
BUN: 7 mg/dL (ref 6–20)
CO2: 35 mmol/L — ABNORMAL HIGH (ref 22–32)
Calcium: 8.4 mg/dL — ABNORMAL LOW (ref 8.9–10.3)
Chloride: 94 mmol/L — ABNORMAL LOW (ref 101–111)
Creatinine, Ser: 0.73 mg/dL (ref 0.61–1.24)
GFR calc Af Amer: >60 mL/min (ref >60)
GFR calc non Af Amer: >60 mL/min (ref >60)
Glucose, Bld: 256 mg/dL — ABNORMAL HIGH (ref 65–99)
Potassium: 4.3 mmol/L (ref 3.5–5.1)
Sodium: 135 mmol/L (ref 135–145)

## 2017-05-14 LAB — CBC WITH DIFFERENTIAL/PLATELET
Basophils Absolute: 0 10*3/uL (ref 0.0–0.1)
Basophils Relative: 0 %
EOS ABS: 0.1 10*3/uL (ref 0.0–0.7)
Eosinophils Relative: 1 %
HEMATOCRIT: 44.1 % (ref 39.0–52.0)
HEMOGLOBIN: 13 g/dL (ref 13.0–17.0)
Lymphocytes Relative: 15 %
Lymphs Abs: 1.6 10*3/uL (ref 0.7–4.0)
MCH: 24.8 pg — ABNORMAL LOW (ref 26.0–34.0)
MCHC: 29.5 g/dL — ABNORMAL LOW (ref 30.0–36.0)
MCV: 84 fL (ref 78.0–100.0)
Monocytes Absolute: 0.9 10*3/uL (ref 0.1–1.0)
Monocytes Relative: 9 %
NEUTROS ABS: 8 10*3/uL — AB (ref 1.7–7.7)
NEUTROS PCT: 75 %
Platelets: 294 10*3/uL (ref 150–400)
RBC: 5.25 MIL/uL (ref 4.22–5.81)
RDW: 15.9 % — ABNORMAL HIGH (ref 11.5–15.5)
WBC: 10.5 10*3/uL (ref 4.0–10.5)

## 2017-05-14 LAB — TROPONIN I: Troponin I: 0.04 ng/mL (ref <0.03)

## 2017-05-14 MED ORDER — ALBUTEROL SULFATE (2.5 MG/3ML) 0.083% IN NEBU
2.5000 mg | INHALATION_SOLUTION | Freq: Once | RESPIRATORY_TRACT | Status: DC
  Administered 2017-05-14: 2.5 mg via RESPIRATORY_TRACT

## 2017-05-14 MED ORDER — IPRATROPIUM-ALBUTEROL 0.5-2.5 (3) MG/3ML IN SOLN
3.0000 mL | Freq: Once | RESPIRATORY_TRACT | Status: AC
  Administered 2017-05-14: 3 mL via RESPIRATORY_TRACT
  Filled 2017-05-14: qty 3

## 2017-05-14 MED ORDER — METHYLPREDNISOLONE SODIUM SUCC 125 MG IJ SOLR
125.0000 mg | Freq: Once | INTRAMUSCULAR | Status: AC
  Administered 2017-05-14: 125 mg via INTRAVENOUS
  Filled 2017-05-14: qty 2

## 2017-05-14 MED ORDER — DEXTROSE 5 % IV SOLN
2.0000 g | Freq: Once | INTRAVENOUS | Status: AC
  Administered 2017-05-15: 2 g via INTRAVENOUS
  Filled 2017-05-14: qty 2

## 2017-05-14 MED ORDER — IOPAMIDOL (ISOVUE-300) INJECTION 61%
80.0000 mL | Freq: Once | INTRAVENOUS | Status: AC | PRN
  Administered 2017-05-14: 22:00:00 via INTRAVENOUS

## 2017-05-14 MED ORDER — VANCOMYCIN HCL IN DEXTROSE 1-5 GM/200ML-% IV SOLN
1000.0000 mg | INTRAVENOUS | Status: AC
  Administered 2017-05-15 (×2): 1000 mg via INTRAVENOUS
  Filled 2017-05-14 (×2): qty 200
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
## 2017-05-14 NOTE — ED Notes (Signed)
ED Provider at bedside to updated on status

## 2017-05-14 NOTE — ED Notes (Signed)
Patient transported to CT 

## 2017-05-14 NOTE — ED Notes (Signed)
CRITICAL VALUE ALERT  Critical Value:  Troponin 0.04  Date & Time Notied:  05/14/17 & 2110 hrs  Provider Notified: Dr. Lacinda Axon  Orders Received/Actions taken: N/A
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
## 2017-05-14 NOTE — ED Notes (Signed)
Attempted to wean pt to nasal cannula, pt desat to 86%, returned to NRB as previous, saturation remain 90-92%. Admitting physician at the bedside.

## 2017-05-15 ENCOUNTER — Inpatient Hospital Stay (HOSPITAL_COMMUNITY): Payer: No Typology Code available for payment source

## 2017-05-15 ENCOUNTER — Inpatient Hospital Stay (HOSPITAL_COMMUNITY)
Admit: 2017-05-15 | Discharge: 2017-05-15 | Disposition: A | Discharge status: Home / Self Care | Payer: No Typology Code available for payment source | Attending: Cardiovascular Disease | Admitting: Cardiovascular Disease

## 2017-05-15 DIAGNOSIS — J440 Chronic obstructive pulmonary disease with acute lower respiratory infection: Secondary | ICD-10-CM | POA: Diagnosis present

## 2017-05-15 DIAGNOSIS — J438 Other emphysema: Secondary | ICD-10-CM

## 2017-05-15 DIAGNOSIS — Y95 Nosocomial condition: Secondary | ICD-10-CM | POA: Diagnosis present

## 2017-05-15 DIAGNOSIS — J9621 Acute and chronic respiratory failure with hypoxia: Secondary | ICD-10-CM

## 2017-05-15 DIAGNOSIS — I5042 Chronic combined systolic (congestive) and diastolic (congestive) heart failure: Secondary | ICD-10-CM

## 2017-05-15 DIAGNOSIS — I4510 Unspecified right bundle-branch block: Secondary | ICD-10-CM | POA: Diagnosis present

## 2017-05-15 DIAGNOSIS — R55 Syncope and collapse: Secondary | ICD-10-CM

## 2017-05-15 DIAGNOSIS — J189 Pneumonia, unspecified organism: Secondary | ICD-10-CM | POA: Diagnosis present

## 2017-05-15 DIAGNOSIS — I110 Hypertensive heart disease with heart failure: Secondary | ICD-10-CM | POA: Diagnosis present

## 2017-05-15 DIAGNOSIS — I455 Other specified heart block: Secondary | ICD-10-CM | POA: Diagnosis not present

## 2017-05-15 DIAGNOSIS — I10 Essential (primary) hypertension: Secondary | ICD-10-CM | POA: Diagnosis not present

## 2017-05-15 DIAGNOSIS — Z955 Presence of coronary angioplasty implant and graft: Secondary | ICD-10-CM

## 2017-05-15 DIAGNOSIS — I2510 Atherosclerotic heart disease of native coronary artery without angina pectoris: Secondary | ICD-10-CM | POA: Diagnosis present

## 2017-05-15 DIAGNOSIS — F17200 Nicotine dependence, unspecified, uncomplicated: Secondary | ICD-10-CM

## 2017-05-15 DIAGNOSIS — E11649 Type 2 diabetes mellitus with hypoglycemia without coma: Secondary | ICD-10-CM | POA: Diagnosis not present

## 2017-05-15 DIAGNOSIS — I252 Old myocardial infarction: Secondary | ICD-10-CM | POA: Diagnosis not present

## 2017-05-15 DIAGNOSIS — G4733 Obstructive sleep apnea (adult) (pediatric): Secondary | ICD-10-CM | POA: Diagnosis present

## 2017-05-15 DIAGNOSIS — Z7984 Long term (current) use of oral hypoglycemic drugs: Secondary | ICD-10-CM | POA: Diagnosis not present

## 2017-05-15 DIAGNOSIS — Y92410 Unspecified street and highway as the place of occurrence of the external cause: Secondary | ICD-10-CM | POA: Diagnosis not present

## 2017-05-15 DIAGNOSIS — G4730 Sleep apnea, unspecified: Secondary | ICD-10-CM

## 2017-05-15 DIAGNOSIS — I25119 Atherosclerotic heart disease of native coronary artery with unspecified angina pectoris: Secondary | ICD-10-CM | POA: Diagnosis not present

## 2017-05-15 DIAGNOSIS — J181 Lobar pneumonia, unspecified organism: Secondary | ICD-10-CM

## 2017-05-15 DIAGNOSIS — I255 Ischemic cardiomyopathy: Secondary | ICD-10-CM | POA: Diagnosis present

## 2017-05-15 DIAGNOSIS — E784 Other hyperlipidemia: Secondary | ICD-10-CM | POA: Diagnosis not present

## 2017-05-15 DIAGNOSIS — E1165 Type 2 diabetes mellitus with hyperglycemia: Secondary | ICD-10-CM | POA: Diagnosis present

## 2017-05-15 DIAGNOSIS — J9601 Acute respiratory failure with hypoxia: Secondary | ICD-10-CM

## 2017-05-15 DIAGNOSIS — F17210 Nicotine dependence, cigarettes, uncomplicated: Secondary | ICD-10-CM | POA: Diagnosis present

## 2017-05-15 DIAGNOSIS — Z96641 Presence of right artificial hip joint: Secondary | ICD-10-CM | POA: Diagnosis present

## 2017-05-15 DIAGNOSIS — Z6837 Body mass index (BMI) 37.0-37.9, adult: Secondary | ICD-10-CM | POA: Diagnosis not present

## 2017-05-15 DIAGNOSIS — Z7982 Long term (current) use of aspirin: Secondary | ICD-10-CM | POA: Diagnosis not present

## 2017-05-15 DIAGNOSIS — E119 Type 2 diabetes mellitus without complications: Secondary | ICD-10-CM

## 2017-05-15 DIAGNOSIS — Z9119 Patient's noncompliance with other medical treatment and regimen: Secondary | ICD-10-CM | POA: Diagnosis not present

## 2017-05-15 DIAGNOSIS — Z79899 Other long term (current) drug therapy: Secondary | ICD-10-CM | POA: Diagnosis not present

## 2017-05-15 DIAGNOSIS — E785 Hyperlipidemia, unspecified: Secondary | ICD-10-CM | POA: Diagnosis present

## 2017-05-15 DIAGNOSIS — M542 Cervicalgia: Secondary | ICD-10-CM | POA: Diagnosis present

## 2017-05-15 DIAGNOSIS — G4700 Insomnia, unspecified: Secondary | ICD-10-CM | POA: Diagnosis present

## 2017-05-15 HISTORY — DX: Lobar pneumonia, unspecified organism: J18.1

## 2017-05-15 HISTORY — DX: Syncope and collapse: R55

## 2017-05-15 HISTORY — DX: Pneumonia, unspecified organism: J18.9

## 2017-05-15 LAB — BASIC METABOLIC PANEL
Anion gap: 8 (ref 5–15)
BUN: 9 mg/dL (ref 6–20)
CHLORIDE: 94 mmol/L — AB (ref 101–111)
CO2: 34 mmol/L — AB (ref 22–32)
Calcium: 8.6 mg/dL — ABNORMAL LOW (ref 8.9–10.3)
Creatinine, Ser: 0.76 mg/dL (ref 0.61–1.24)
GFR calc Af Amer: >60 mL/min (ref >60)
GFR calc non Af Amer: >60 mL/min (ref >60)
Glucose, Bld: 336 mg/dL — ABNORMAL HIGH (ref 65–99)
POTASSIUM: 4.8 mmol/L (ref 3.5–5.1)
SODIUM: 136 mmol/L (ref 135–145)

## 2017-05-15 LAB — CBC WITH DIFFERENTIAL/PLATELET
Basophils Absolute: 0 10*3/uL (ref 0.0–0.1)
Basophils Relative: 0 %
EOS PCT: 0 %
Eosinophils Absolute: 0 10*3/uL (ref 0.0–0.7)
HCT: 45.8 % (ref 39.0–52.0)
HEMOGLOBIN: 13.4 g/dL (ref 13.0–17.0)
LYMPHS ABS: 0.6 10*3/uL — AB (ref 0.7–4.0)
LYMPHS PCT: 6 %
MCH: 24.6 pg — AB (ref 26.0–34.0)
MCHC: 29.3 g/dL — ABNORMAL LOW (ref 30.0–36.0)
MCV: 84 fL (ref 78.0–100.0)
MONOS PCT: 5 %
Monocytes Absolute: 0.5 10*3/uL (ref 0.1–1.0)
NEUTROS PCT: 89 %
Neutro Abs: 9.5 10*3/uL — ABNORMAL HIGH (ref 1.7–7.7)
Platelets: 309 10*3/uL (ref 150–400)
RBC: 5.45 MIL/uL (ref 4.22–5.81)
RDW: 16.1 % — ABNORMAL HIGH (ref 11.5–15.5)
WBC: 10.7 10*3/uL — AB (ref 4.0–10.5)

## 2017-05-15 LAB — RESPIRATORY PANEL BY PCR
ADENOVIRUS-RVPPCR: NOT DETECTED
Bordetella pertussis: NOT DETECTED
CHLAMYDOPHILA PNEUMONIAE-RVPPCR: NOT DETECTED
CORONAVIRUS 229E-RVPPCR: NOT DETECTED
CORONAVIRUS NL63-RVPPCR: NOT DETECTED
CORONAVIRUS OC43-RVPPCR: NOT DETECTED
Coronavirus HKU1: NOT DETECTED
INFLUENZA A H1 2009-RVPPR: NOT DETECTED
INFLUENZA A H3-RVPPCR: NOT DETECTED
INFLUENZA A-RVPPCR: NOT DETECTED
INFLUENZA B-RVPPCR: NOT DETECTED
Influenza A H1: NOT DETECTED
MYCOPLASMA PNEUMONIAE-RVPPCR: NOT DETECTED
Metapneumovirus: NOT DETECTED
PARAINFLUENZA VIRUS 1-RVPPCR: NOT DETECTED
PARAINFLUENZA VIRUS 4-RVPPCR: NOT DETECTED
Parainfluenza Virus 2: NOT DETECTED
Parainfluenza Virus 3: NOT DETECTED
Respiratory Syncytial Virus: NOT DETECTED
Rhinovirus / Enterovirus: NOT DETECTED

## 2017-05-15 LAB — GLUCOSE, CAPILLARY
GLUCOSE-CAPILLARY: 245 mg/dL — AB (ref 65–99)
Glucose-Capillary: 288 mg/dL — ABNORMAL HIGH (ref 65–99)
Glucose-Capillary: 249 mg/dL — ABNORMAL HIGH (ref 65–99)
Glucose-Capillary: 257 mg/dL — ABNORMAL HIGH (ref 65–99)

## 2017-05-15 LAB — MRSA PCR SCREENING: MRSA by PCR: NEGATIVE

## 2017-05-15 LAB — TROPONIN I: Troponin I: 0.03 ng/mL (ref <0.03)

## 2017-05-15 LAB — RAPID URINE DRUG SCREEN, HOSP PERFORMED
Amphetamines: NOT DETECTED
BARBITURATES: NOT DETECTED
BENZODIAZEPINES: NOT DETECTED
Cocaine: NOT DETECTED
Opiates: NOT DETECTED
Tetrahydrocannabinol: NOT DETECTED

## 2017-05-15 LAB — LACTIC ACID, PLASMA: LACTIC ACID, VENOUS: 3.6 mmol/L — AB (ref 0.5–1.9)

## 2017-05-15 LAB — STREP PNEUMONIAE URINARY ANTIGEN: Strep Pneumo Urinary Antigen: NEGATIVE

## 2017-05-15 LAB — MAGNESIUM: Magnesium: 1.8 mg/dL (ref 1.7–2.4)

## 2017-05-15 LAB — PROCALCITONIN

## 2017-05-15 MED ORDER — PRASUGREL HCL 10 MG PO TABS
10.0000 mg | ORAL_TABLET | Freq: Every day | ORAL | Status: DC
  Administered 2017-05-15 – 2017-05-18 (×4): 10 mg via ORAL
  Filled 2017-05-15 (×6): qty 1

## 2017-05-15 MED ORDER — TRAZODONE HCL 50 MG PO TABS
100.0000 mg | ORAL_TABLET | Freq: Every day | ORAL | Status: DC
  Administered 2017-05-15 – 2017-05-17 (×4): 100 mg via ORAL
  Filled 2017-05-15 (×4): qty 2

## 2017-05-15 MED ORDER — NITROGLYCERIN 0.4 MG SL SUBL: 0.4000 mg | SUBLINGUAL_TABLET | SUBLINGUAL | Status: DC | PRN

## 2017-05-15 MED ORDER — INSULIN ASPART 100 UNIT/ML ~~LOC~~ SOLN
0.0000 [IU] | Freq: Three times a day (TID) | SUBCUTANEOUS | Status: DC
  Administered 2017-05-15 (×2): 5 [IU] via SUBCUTANEOUS
  Administered 2017-05-15: 8 [IU] via SUBCUTANEOUS
  Administered 2017-05-16: 5 [IU] via SUBCUTANEOUS
  Administered 2017-05-16: 3 [IU] via SUBCUTANEOUS

## 2017-05-15 MED ORDER — IPRATROPIUM-ALBUTEROL 0.5-2.5 (3) MG/3ML IN SOLN
3.0000 mL | Freq: Three times a day (TID) | RESPIRATORY_TRACT | Status: DC
  Administered 2017-05-16 – 2017-05-18 (×7): 3 mL via RESPIRATORY_TRACT
  Filled 2017-05-15 (×6): qty 3

## 2017-05-15 MED ORDER — IPRATROPIUM-ALBUTEROL 0.5-2.5 (3) MG/3ML IN SOLN
RESPIRATORY_TRACT | Status: AC
  Filled 2017-05-15: qty 3

## 2017-05-15 MED ORDER — ENOXAPARIN SODIUM 40 MG/0.4ML ~~LOC~~ SOLN
40.0000 mg | SUBCUTANEOUS | Status: DC
  Administered 2017-05-15 – 2017-05-18 (×4): 40 mg via SUBCUTANEOUS
  Filled 2017-05-15 (×4): qty 0.4

## 2017-05-15 MED ORDER — ALBUTEROL SULFATE (2.5 MG/3ML) 0.083% IN NEBU: 2.5000 mg | INHALATION_SOLUTION | RESPIRATORY_TRACT | Status: DC | PRN

## 2017-05-15 MED ORDER — IPRATROPIUM-ALBUTEROL 0.5-2.5 (3) MG/3ML IN SOLN
3.0000 mL | Freq: Four times a day (QID) | RESPIRATORY_TRACT | Status: DC
  Administered 2017-05-15 (×4): 3 mL via RESPIRATORY_TRACT
  Filled 2017-05-15 (×3): qty 3

## 2017-05-15 MED ORDER — ATORVASTATIN CALCIUM 40 MG PO TABS
80.0000 mg | ORAL_TABLET | Freq: Every day | ORAL | Status: DC
  Administered 2017-05-15 – 2017-05-18 (×4): 80 mg via ORAL
  Filled 2017-05-15: qty 2
  Filled 2017-05-15: qty 1
  Filled 2017-05-15 (×3): qty 2

## 2017-05-15 MED ORDER — SODIUM CHLORIDE 0.9 % IV BOLUS (SEPSIS)
1000.0000 mL | Freq: Once | INTRAVENOUS | Status: AC
  Administered 2017-05-15: 1000 mL via INTRAVENOUS

## 2017-05-15 MED ORDER — OXYCODONE HCL 5 MG PO TABS
5.0000 mg | ORAL_TABLET | ORAL | Status: DC | PRN
  Administered 2017-05-15 – 2017-05-18 (×8): 5 mg via ORAL
  Filled 2017-05-15 (×8): qty 1

## 2017-05-15 MED ORDER — ASPIRIN EC 81 MG PO TBEC
81.0000 mg | DELAYED_RELEASE_TABLET | Freq: Every day | ORAL | Status: DC
  Administered 2017-05-15 – 2017-05-18 (×4): 81 mg via ORAL
  Filled 2017-05-15 (×4): qty 1

## 2017-05-15 MED ORDER — DEXTROSE 5 % IV SOLN
2.0000 g | Freq: Once | INTRAVENOUS | Status: DC
  Filled 2017-05-15: qty 2

## 2017-05-15 MED ORDER — KCL IN DEXTROSE-NACL 20-5-0.45 MEQ/L-%-% IV SOLN
INTRAVENOUS | Status: DC
  Administered 2017-05-15: 02:00:00 via INTRAVENOUS
  Filled 2017-05-15 (×2): qty 1000

## 2017-05-15 MED ORDER — CYCLOSPORINE 0.05 % OP EMUL
1.0000 [drp] | Freq: Two times a day (BID) | OPHTHALMIC | Status: DC
  Administered 2017-05-15 – 2017-05-18 (×7): 1 [drp] via OPHTHALMIC
  Filled 2017-05-15 (×8): qty 1

## 2017-05-15 MED ORDER — MAGNESIUM OXIDE 400 (241.3 MG) MG PO TABS
200.0000 mg | ORAL_TABLET | Freq: Every day | ORAL | Status: DC
  Administered 2017-05-15 – 2017-05-18 (×4): 200 mg via ORAL
  Filled 2017-05-15 (×4): qty 1

## 2017-05-15 MED ORDER — SALINE SPRAY 0.65 % NA SOLN
1.0000 | NASAL | Status: DC | PRN
  Administered 2017-05-15: 1 via NASAL
  Filled 2017-05-15: qty 44

## 2017-05-15 MED ORDER — FUROSEMIDE 20 MG PO TABS
20.0000 mg | ORAL_TABLET | Freq: Every day | ORAL | Status: DC
  Administered 2017-05-15 – 2017-05-18 (×4): 20 mg via ORAL
  Filled 2017-05-15 (×4): qty 1

## 2017-05-15 MED ORDER — DEXTROSE 5 % IV SOLN
1.0000 g | Freq: Three times a day (TID) | INTRAVENOUS | Status: DC
  Administered 2017-05-15 – 2017-05-18 (×10): 1 g via INTRAVENOUS
  Filled 2017-05-15 (×14): qty 1

## 2017-05-15 MED ORDER — AMLODIPINE BESYLATE 5 MG PO TABS
5.0000 mg | ORAL_TABLET | Freq: Every day | ORAL | Status: DC
  Administered 2017-05-15 – 2017-05-16 (×2): 5 mg via ORAL
  Filled 2017-05-15 (×2): qty 1

## 2017-05-15 MED ORDER — VANCOMYCIN HCL IN DEXTROSE 1-5 GM/200ML-% IV SOLN: 1000.0000 mg | Freq: Two times a day (BID) | INTRAVENOUS | Status: DC

## 2017-05-15 MED ORDER — GUAIFENESIN ER 600 MG PO TB12
600.0000 mg | ORAL_TABLET | Freq: Two times a day (BID) | ORAL | Status: DC
  Administered 2017-05-15 – 2017-05-18 (×7): 600 mg via ORAL
  Filled 2017-05-15 (×7): qty 1

## 2017-05-15 MED ORDER — HYDRALAZINE HCL 20 MG/ML IJ SOLN: 10.0000 mg | Freq: Three times a day (TID) | INTRAMUSCULAR | Status: DC | PRN

## 2017-05-15 MED ORDER — PANTOPRAZOLE SODIUM 40 MG PO TBEC
40.0000 mg | DELAYED_RELEASE_TABLET | Freq: Every day | ORAL | Status: DC
  Administered 2017-05-15 – 2017-05-18 (×4): 40 mg via ORAL
  Filled 2017-05-15 (×4): qty 1

## 2017-05-15 MED ORDER — SODIUM CHLORIDE 0.9 % IV SOLN
INTRAVENOUS | Status: AC
  Administered 2017-05-15 – 2017-05-16 (×2): via INTRAVENOUS

## 2017-05-15 MED ORDER — ACETAMINOPHEN 325 MG PO TABS
650.0000 mg | ORAL_TABLET | Freq: Four times a day (QID) | ORAL | Status: DC | PRN
  Administered 2017-05-15 (×2): 650 mg via ORAL
  Filled 2017-05-15 (×3): qty 2
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
## 2017-05-15 NOTE — Consult Note (Signed)
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 (he has severe sleep apnea at least by history and objective data), and he may have had sinus arrest.  He requires CPAP now and he can follow up as an outpatient for polysomnography.  Sleep apnea may be mixed with both obstructive and central components CPAP can potentially cure his nocturnal sinus pauses. We will speak to case management about obtaining CPAP while hospitalized.   He would benefit from a 30-day event monitor in order to exclude sinus pauses/arrest while he is awake. If he had documented symptomatic sinus bradycardia and/or symptomatic sinus pauses or sinus arrest (in spite of treatment with CPAP for severe sleep apnea), a pacemaker would be indicated.  He needs aggressive weight loss as well for morbid obesity as this is contributing to both sleep apnea and sinus  bradycardia/pauses.  He says he is compliant with dual antiplatelet therapy but says prasugrel causes him to have "anxiety and panic attacks at night" and says he feels short of breath at night.  I explained to him that these episodes have nothing to do with prasugrel, but are actually a result of his severe sleep apnea and resultant pauses.   Kate Sable, MD, [facility]  05/15/2017 10:07 AM

## 2017-05-15 NOTE — Progress Notes (Signed)
Pt having 2-3sec pauses frequently. Occurs mainly while patient is sleeping. RN noted that this problem occurred during admission [facility] on 04/28/2017 as well. HR drops to the 20's-30's. MD notified. Patient is asymptomatic.   Margaret Pyle, RN
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
## 2017-05-15 NOTE — Progress Notes (Signed)
EEG completed, results pending. 

## 2017-05-15 NOTE — Progress Notes (Signed)
Pharmacy Antibiotic Note  Randy Hebert is a 51 y.o. male admitted on 05/14/2017 with pneumonia.  Pharmacy has been consulted for Vancomycin and Cefepime dosing.  Plan: Vancomycin 2000mg  loading dose, then 1000mg   IV every 12 hours.  Goal trough 15-20 mcg/mL. Cefepime 2gm lV loading dose, then 1gm IV q8h F/U cxs and clinical progress Monitor V/S, labs, and levels as indicated  Height: 5\' 5"  (165.1 cm) Weight: 225 lb 1.4 oz (102.1 kg) IBW/kg (Calculated) : 61.5  Temp (24hrs), Avg:98.6 F (37 C), Min:98.3 F (36.8 C), Max:98.8 F (37.1 C)   Recent Labs Lab 05/14/17 1957 05/15/17 0530  WBC 10.5 10.7*  CREATININE 0.73 0.76    Estimated Creatinine Clearance: 118.7 mL/min (by C-G formula based on SCr of 0.76 mg/dL).    Allergies  Allergen Reactions  . Carvedilol Other (See Comments)    Sinus pause on telemetry >3 seconds. Longest one 9 sec. No AV nodal agent  . Lisinopril Anaphylaxis, Shortness Of Breath and Swelling    Angioedema, required intubation and mechanical ventilation  . Amoxicillin Nausea And Vomiting    Nausea only - no allergy     Antimicrobials this admission: Vancomycin 9/14 >>  Cefepime 9/14 >>  Dose adjustments this admission: N/A  Microbiology results: 9/14 BCx: pending 9/14 MRSA PCR: neg  Thank you for allowing pharmacy to be a part of this patient's care.  Isac Sarna, BS Pharm D, [place] Clinical Pharmacist Pager [phone] 05/15/2017 7:56 AM
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
## 2017-05-15 NOTE — Care Management Note (Addendum)
Case Management Note  Patient Details  Name: Randy Hebert MRN: [id number] Date of Birth: 07/15/1966  Subjective/Objective:  Adm with HCAP. Recent NSTEMI (Aug 2018) resulting in stent placement to proximal ramus, hx of CHF, COPD, DM, Hyperlipidemia, sinus pauses. Ind PTA. Has oxygen with [employer]. Patient will need a CPAP. Has sleep study schedule on Sept 28th per patient. CM has placed a call to RT at [facility] to inquire about loaner CPAP.                 Action/Plan:  Wait on return call from [place]. Anticipate patient can have loaner and pay OOP until his sleep study is completed.   ADDENDUM: Spoke with Mariann Laster from [employer]. Patient can rent CPAP for $115/ per month. Supplies are an extra cost. However if patient can take tubing and mask that are used while here in the hospital, he will not have any extra cost. Patient and respiratory updated on this information. Discussed with Dr. Carles Collet, we will use patient's setting from previous stay at [facility] for CPAP order. CM will fax to [facility] when available. [facility] is open until 6 pm today and closed Saturday and Sunday. Discussed with patient the option of getting CPAP finalized today and having someone pick up CPAP. Per family member in the room, she is unwilling to do this, will not give a reason why. Discussed with patient, he is willing to pick up from [place] when he is discharged from hospital. Dr. Carles Collet aware. Discharge not expected  to happen over the weekend. CM will follow up again on Monday.   Expected Discharge Date:    05/17/2017              Expected Discharge Plan:  Home/Self Care  In-House Referral:     Discharge planning Services  CM Consult  Post Acute Care Choice:  Durable Medical Equipment Choice offered to:  Patient  DME Arranged:   CPAP DME Agency:   [facility]  HH Arranged:    [place] Agency:     Status of Service:  In process, will  continue to follow  If discussed at Long Length of Stay Meetings, dates discussed:    Additional Comments:  [name], [name], RN 05/15/2017, 12:21 PM

## 2017-05-15 NOTE — Plan of Care (Signed)
Problem: Safety: Goal: Ability to remain free from injury will improve Outcome: Progressing Bed alarm applied; Bed in lowest position; Belongings within reach; Patient given call light and advised to call before attempting to get up, and to call if needing assistance.

## 2017-05-15 NOTE — Progress Notes (Signed)
Patient continued to have multiple pauses throughout night. HR noted as low as 25. Patient was placed on Bipap due to sleep apnea. This did not improve the pauses in his HR while sleeping. Patient has not had sleep study yet. He states it is scheduled for the 28th of this month.  Patient still remains asymptomatic and is easy to arouse. Alert and Oriented X4.

## 2017-05-16 LAB — GLUCOSE, CAPILLARY
GLUCOSE-CAPILLARY: 152 mg/dL — AB (ref 65–99)
GLUCOSE-CAPILLARY: 238 mg/dL — AB (ref 65–99)
GLUCOSE-CAPILLARY: 138 mg/dL — AB (ref 65–99)
Glucose-Capillary: 181 mg/dL — ABNORMAL HIGH (ref 65–99)

## 2017-05-16 LAB — CBC
HEMATOCRIT: 44.7 % (ref 39.0–52.0)
HEMOGLOBIN: 13.1 g/dL (ref 13.0–17.0)
MCH: 24.8 pg — AB (ref 26.0–34.0)
MCHC: 29.3 g/dL — ABNORMAL LOW (ref 30.0–36.0)
MCV: 84.5 fL (ref 78.0–100.0)
Platelets: 309 10*3/uL (ref 150–400)
RBC: 5.29 MIL/uL (ref 4.22–5.81)
RDW: 16.2 % — ABNORMAL HIGH (ref 11.5–15.5)
WBC: 9 10*3/uL (ref 4.0–10.5)

## 2017-05-16 LAB — BASIC METABOLIC PANEL
Anion gap: 7 (ref 5–15)
BUN: 14 mg/dL (ref 6–20)
CALCIUM: 8.7 mg/dL — AB (ref 8.9–10.3)
CO2: 38 mmol/L — AB (ref 22–32)
Chloride: 96 mmol/L — ABNORMAL LOW (ref 101–111)
Creatinine, Ser: 0.79 mg/dL (ref 0.61–1.24)
GFR calc non Af Amer: >60 mL/min (ref >60)
Glucose, Bld: 148 mg/dL — ABNORMAL HIGH (ref 65–99)
POTASSIUM: 4.5 mmol/L (ref 3.5–5.1)
Sodium: 141 mmol/L (ref 135–145)

## 2017-05-16 LAB — HIV ANTIBODY (ROUTINE TESTING W REFLEX): HIV SCREEN 4TH GENERATION: NONREACTIVE

## 2017-05-16 LAB — MAGNESIUM: MAGNESIUM: 1.8 mg/dL (ref 1.7–2.4)

## 2017-05-16 LAB — LACTIC ACID, PLASMA: Lactic Acid, Venous: 1.3 mmol/L (ref 0.5–1.9)

## 2017-05-16 MED ORDER — BUDESONIDE 0.5 MG/2ML IN SUSP
0.5000 mg | Freq: Two times a day (BID) | RESPIRATORY_TRACT | Status: DC
  Administered 2017-05-17 – 2017-05-18 (×3): 0.5 mg via RESPIRATORY_TRACT
  Filled 2017-05-16 (×3): qty 2

## 2017-05-16 MED ORDER — INSULIN ASPART 100 UNIT/ML ~~LOC~~ SOLN
0.0000 [IU] | Freq: Every day | SUBCUTANEOUS | Status: DC
  Administered 2017-05-18: 3 [IU] via SUBCUTANEOUS

## 2017-05-16 MED ORDER — INSULIN ASPART 100 UNIT/ML ~~LOC~~ SOLN
0.0000 [IU] | Freq: Three times a day (TID) | SUBCUTANEOUS | Status: DC
  Administered 2017-05-16: 3 [IU] via SUBCUTANEOUS
  Administered 2017-05-17 (×2): 2 [IU] via SUBCUTANEOUS
  Administered 2017-05-18: 3 [IU] via SUBCUTANEOUS
  Administered 2017-05-18: 5 [IU] via SUBCUTANEOUS

## 2017-05-16 MED ORDER — INSULIN ASPART 100 UNIT/ML ~~LOC~~ SOLN
4.0000 [IU] | Freq: Three times a day (TID) | SUBCUTANEOUS | Status: DC
  Administered 2017-05-16 – 2017-05-17 (×3): 4 [IU] via SUBCUTANEOUS
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
## 2017-05-17 LAB — GLUCOSE, CAPILLARY
GLUCOSE-CAPILLARY: 149 mg/dL — AB (ref 65–99)
GLUCOSE-CAPILLARY: 205 mg/dL — AB (ref 65–99)
GLUCOSE-CAPILLARY: 137 mg/dL — AB (ref 65–99)
GLUCOSE-CAPILLARY: 269 mg/dL — AB (ref 65–99)
Glucose-Capillary: 47 mg/dL — ABNORMAL LOW (ref 65–99)

## 2017-05-17 LAB — BASIC METABOLIC PANEL
Anion gap: 10 (ref 5–15)
BUN: 14 mg/dL (ref 6–20)
CHLORIDE: 94 mmol/L — AB (ref 101–111)
CO2: 35 mmol/L — AB (ref 22–32)
CREATININE: 0.75 mg/dL (ref 0.61–1.24)
Calcium: 8.9 mg/dL (ref 8.9–10.3)
GFR calc Af Amer: >60 mL/min (ref >60)
GFR calc non Af Amer: >60 mL/min (ref >60)
GLUCOSE: 143 mg/dL — AB (ref 65–99)
POTASSIUM: 4 mmol/L (ref 3.5–5.1)
SODIUM: 139 mmol/L (ref 135–145)

## 2017-05-17 LAB — CBC
HEMATOCRIT: 45.2 % (ref 39.0–52.0)
HEMOGLOBIN: 13.5 g/dL (ref 13.0–17.0)
MCH: 24.6 pg — AB (ref 26.0–34.0)
MCHC: 29.9 g/dL — ABNORMAL LOW (ref 30.0–36.0)
MCV: 82.5 fL (ref 78.0–100.0)
Platelets: 301 10*3/uL (ref 150–400)
RBC: 5.48 MIL/uL (ref 4.22–5.81)
RDW: 16 % — ABNORMAL HIGH (ref 11.5–15.5)
WBC: 9.4 10*3/uL (ref 4.0–10.5)

## 2017-05-17 LAB — PHOSPHORUS: Phosphorus: 3.7 mg/dL (ref 2.5–4.6)

## 2017-05-17 LAB — PROCALCITONIN

## 2017-05-17 NOTE — Progress Notes (Signed)
Hand-off communication reported to [facility], RN in preparation for transfer to room 318
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
## 2017-05-18 ENCOUNTER — Other Ambulatory Visit: Payer: Self-pay | Admitting: Adult Health

## 2017-05-18 ENCOUNTER — Other Ambulatory Visit: Payer: Self-pay | Admitting: *Deleted

## 2017-05-18 DIAGNOSIS — I25119 Atherosclerotic heart disease of native coronary artery with unspecified angina pectoris: Secondary | ICD-10-CM

## 2017-05-18 DIAGNOSIS — G4733 Obstructive sleep apnea (adult) (pediatric): Secondary | ICD-10-CM

## 2017-05-18 DIAGNOSIS — R55 Syncope and collapse: Secondary | ICD-10-CM

## 2017-05-18 LAB — GLUCOSE, CAPILLARY
Glucose-Capillary: 205 mg/dL — ABNORMAL HIGH (ref 65–99)
Glucose-Capillary: 167 mg/dL — ABNORMAL HIGH (ref 65–99)

## 2017-05-18 MED ORDER — AMOXICILLIN-POT CLAVULANATE 875-125 MG PO TABS: 1.0000 | ORAL_TABLET | Freq: Two times a day (BID) | ORAL | 0 refills | Status: DC

## 2017-05-18 MED ORDER — AMOXICILLIN-POT CLAVULANATE 875-125 MG PO TABS: 1.0000 | ORAL_TABLET | Freq: Two times a day (BID) | ORAL | Status: DC

## 2017-05-18 MED ORDER — OXYCODONE HCL 5 MG PO TABS: 5.0000 mg | ORAL_TABLET | Freq: Four times a day (QID) | ORAL | 0 refills | Status: DC | PRN

## 2017-05-18 NOTE — Care Management Note (Signed)
Case Management Note  Patient Details  Name: Randy Hebert MRN: [id number] Date of Birth: July 24, 1966  Expected Discharge Date:  05/18/17               Expected Discharge Plan:  Home/Self Care  Discharge planning Services  CM Consult  Post Acute Care Choice:  Durable Medical Equipment Choice offered to:  Patient  DME Arranged:  Continuous positive airway pressure (CPAP) DME Agency:  [facility]  Status of Service:  Completed, signed off  Additional Comments: Pt discharging home today with self care. Pt has already picked up CPAP from [place]. Pt has home O2 pta. Pt communicates no further needs or concerns about DC.   Sherald Barge, RN 05/18/2017, 1:46 PM
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
## 2017-05-18 NOTE — Progress Notes (Signed)
Pharmacy Antibiotic Note  Randy Hebert is a 51 y.o. male admitted on 05/14/2017 with pneumonia.  Pharmacy has been consulted for Cefepime dosing.  Plan: Continue Cefepime 1gm IV q8h F/U cxs and clinical progress Monitor V/S, labs, and levels as indicated  Height: 5\' 5"  (165.1 cm) Weight: 222 lb 1.6 oz (100.7 kg) IBW/kg (Calculated) : 61.5  Temp (24hrs), Avg:98.3 F (36.8 C), Min:98.2 F (36.8 C), Max:98.4 F (36.9 C)   Recent Labs Lab 05/14/17 1957 05/15/17 0530 05/15/17 0831 05/16/17 0449 05/16/17 1310 05/17/17 0453  WBC 10.5 10.7*  --  9.0  --  9.4  CREATININE 0.73 0.76  --  0.79  --  0.75  LATICACIDVEN  --   --  3.6*  --  1.3  --     Estimated Creatinine Clearance: 117.9 mL/min (by C-G formula based on SCr of 0.75 mg/dL).    Allergies  Allergen Reactions  . Carvedilol Other (See Comments)    Sinus pause on telemetry >3 seconds. Longest one 9 sec. No AV nodal agent  . Lisinopril Anaphylaxis, Shortness Of Breath and Swelling    Angioedema, required intubation and mechanical ventilation  . Amoxicillin Nausea And Vomiting    Nausea only - no allergy    Antimicrobials this admission: Vancomycin 9/14 >> 9/14 Cefepime 9/14 >>  Microbiology results: 9/14 BCx: pending 9/14 MRSA PCR: neg  Thank you for allowing pharmacy to be a part of this patient's care.  Hart Robinsons, PharmD Clinical Pharmacist Pager:  [phone] 05/18/2017   05/18/2017 12:10 PM

## 2017-05-18 NOTE — Progress Notes (Signed)
Progress Note  Patient Name: Randy Hebert Date of Encounter: 05/18/2017  Primary Cardiologist: Dr. Carlyle Dolly   Subjective   Feels much better wants to go home. No chest pain or palpitations. No lightheadedness.  Inpatient Medications    Scheduled Meds: . aspirin EC  81 mg Oral Daily  . atorvastatin  80 mg Oral Daily  . budesonide (PULMICORT) nebulizer solution  0.5 mg Nebulization BID  . cycloSPORINE  1 drop Both Eyes BID  . enoxaparin (LOVENOX) injection  40 mg Subcutaneous Q24H  . furosemide  20 mg Oral Daily  . guaiFENesin  600 mg Oral BID  . insulin aspart  0-15 Units Subcutaneous TID WC  . insulin aspart  0-5 Units Subcutaneous QHS  . ipratropium-albuterol  3 mL Nebulization TID  . magnesium oxide  200 mg Oral Daily  . pantoprazole  40 mg Oral Daily  . prasugrel  10 mg Oral Daily  . traZODone  100 mg Oral QHS   Continuous Infusions: . ceFEPime (MAXIPIME) IV 1 g (05/18/17 4259)  . ceFEPIme (MAXIPIME) 2 GM IVP     PRN Meds: acetaminophen, albuterol, hydrALAZINE, nitroGLYCERIN, oxyCODONE, sodium chloride   Vital Signs    Vitals:   05/17/17 2046 05/17/17 2330 05/18/17 0550 05/18/17 0741  BP:  124/72 131/80   Pulse:  81 85   Resp:  20 20   Temp:  98.2 F (36.8 C) 98.2 F (36.8 C)   TempSrc:  Oral Oral   SpO2: 96% 96% 96% 90%  Weight:   222 lb 1.6 oz (100.7 kg)   Height:        Intake/Output Summary (Last 24 hours) at 05/18/17 1022 Last data filed at 05/17/17 1900  Gross per 24 hour  Intake              700 ml  Output              360 ml  Net              340 ml   Filed Weights   05/16/17 0400 05/17/17 0500 05/18/17 0550  Weight: 227 lb 8.2 oz (103.2 kg) 223 lb 8.7 oz (101.4 kg) 222 lb 1.6 oz (100.7 kg)    Telemetry    Sinus rhythm and sinus bradycardia. Personally reviewed.  Physical Exam   GEN: Obese male, no distress.   Neck:  No elevated JVP. Cardiac:  RRR without gallop.  Respiratory: Clear to auscultation bilaterally. GI:  Soft, nontender, non-distended  MS: No pitting edema; No deformity.  Labs    Chemistry Recent Labs Lab 05/15/17 0530 05/16/17 0449 05/17/17 0453  NA 136 141 139  K 4.8 4.5 4.0  CL 94* 96* 94*  CO2 34* 38* 35*  GLUCOSE 336* 148* 143*  BUN 9 14 14   CREATININE 0.76 0.79 0.75  CALCIUM 8.6* 8.7* 8.9  GFRNONAA >60 >60 >60  GFRAA >60 >60 >60  ANIONGAP 8 7 10      Hematology Recent Labs Lab 05/15/17 0530 05/16/17 0449 05/17/17 0453  WBC 10.7* 9.0 9.4  RBC 5.45 5.29 5.48  HGB 13.4 13.1 13.5  HCT 45.8 44.7 45.2  MCV 84.0 84.5 82.5  MCH 24.6* 24.8* 24.6*  MCHC 29.3* 29.3* 29.9*  RDW 16.1* 16.2* 16.0*  PLT 309 309 301    Cardiac Studies   Echocardiogram 04/27/2017: Study Conclusions  - Left ventricle: The cavity size was normal. There was mild   concentric hypertrophy. Systolic function was mildly to   moderately  reduced. The estimated ejection fraction was in the   range of 40% to 45%. Diffuse hypokinesis. Doppler parameters are   consistent with abnormal left ventricular relaxation (grade 1   diastolic dysfunction). Doppler parameters are consistent with   elevated ventricular end-diastolic filling pressure. - Ventricular septum: Septal motion showed paradox. - Aortic valve: Trileaflet; normal thickness leaflets. There was no   regurgitation. - Mitral valve: Structurally normal valve. There was trivial   regurgitation. - Right ventricle: The cavity size was moderately dilated. Wall   thickness was normal. Systolic function was moderately reduced. - Right atrium: The atrium was normal in size. - Tricuspid valve: There was trivial regurgitation. - Pulmonary arteries: Systolic pressure was within the normal   range. - Inferior vena cava: The vessel was dilated. The respirophasic   diameter changes were blunted (< 50%), consistent with elevated   central venous pressure. - Pericardium, extracardiac: There was no pericardial effusion.  Patient Profile     51  y.o. male with a hx of CAD with recent DES to the proximal ramus on 04/27/2017, ongoing tobacco abuse, morbid obesity, HTN, undiagnosed OSA, oxygen dependent COPD, type II diabetes, and medical noncompliance who is being followed for the evaluation of syncopal episode causing while driving and bradycardia with pauses at the request of Dr.Tat.  Assessment & Plan    1. Sinus pauses: Significantly improved with use of CPAP for documented oxygen desaturations. No BB will be added at this time. No plans for PPM as yet. Patient will need further OP cardiac monitor prior to discharge. Will arrange.   2. Syncope: Likely related to #1. No driving until cardiac monitor confirms no further evidence of pauses.   3. CAD: No active angina with recent DES to the ramus intermedius. Continue secondary prevention with atorvastatin, DAPT with Effient and ASA. Intolerant to [employer].   4. HCAP: Followed by PCP  5. OSA: Although no official diagnosis, with pending sleep study, he had documented O2 desaturations with sinus pauses during hospitalization at [facility] recently and during this admission. We have placed CPAP and he has had no desaturations. Case Management has arranged for home CPAP which he will pay for out of pocket until officially diagnosed by sleep study.   6. Hypertension: Will need close follow up with PCP and our office.   7. Ongoing Tobacco abuse; Reinforced need for immediate cessation.   Signed, Phill Myron. West Pugh, ANP, AACC   05/18/2017, 10:22 AM     Attending note:  Patient seen and examined. Reviewed hospital course and discussed plan with Ms. West Pugh. Mr. Oelke feels much better and anticipates going home today. From a cardiac perspective, sinus pauses have improved significantly since use of CPAP for oxygen desaturations and suspected OSA. Presumably, episode of syncope was due to sinus pauses. He has been counseled not to drive at this time pending further workup and additional  outpatient cardiac monitoring. Would hold off on use of beta blocker (for CAD). Current cardiac regimen includes aspirin, Lipitor, Lasix, and Effient. He has an ACE inhibitor allergy. On examination this morning he is comfortable with heart rate in the 23V, systolic blood pressure to 130s. Lungs are clear without labored breathing. We will schedule a follow-up visit with APP or Dr. [name] after discharge.  Satira Sark, M.D., F.A.C.C.

## 2017-05-18 NOTE — Procedures (Signed)
Randy A. Merlene Laughter, MD     [email or web address]           HISTORY: The patient is a 51 year old man who reports having a spell of loss of consciousness. The studies being done to evaluate for seizures.  MEDICATIONS: Scheduled Meds: Continuous Infusions: PRN Meds:.  Prior to Admission medications   Medication Sig Start Date End Date Taking? Authorizing Provider  albuterol (PROVENTIL HFA;VENTOLIN HFA) 108 (90 Base) MCG/ACT inhaler Inhale 2 puffs into the lungs every 6 (six) hours as needed for wheezing or shortness of breath. 04/24/16   Soyla Dryer, PA-C  albuterol (PROVENTIL) (2.5 MG/3ML) 0.083% nebulizer solution Take 3 mLs (2.5 mg total) by nebulization every 6 (six) hours as needed for wheezing or shortness of breath. 05/27/16   Soyla Dryer, PA-C  amLODipine (NORVASC) 10 MG tablet Take 0.5 tablets (5 mg total) by mouth daily. 04/28/17   Cheryln Manly, NP  aspirin 81 MG EC tablet Take 1 tablet (81 mg total) by mouth daily. 04/29/17   Cheryln Manly, NP  atorvastatin (LIPITOR) 80 MG tablet Take 80 mg by mouth daily.    [provider]  Carboxymeth-Glycerin-Polysorb (REFRESH OPTIVE ADVANCED) 0.5-1-0.5 % SOLN Apply 1 drop to eye 2 (two) times daily as needed (dry eyes).     [provider]  cycloSPORINE (RESTASIS) 0.05 % ophthalmic emulsion Place 1 drop into both eyes 2 (two) times daily.    [provider]  furosemide (LASIX) 20 MG tablet Take 1 tablet (20 mg total) by mouth daily. 04/15/17   Raylene Everts, MD  guaiFENesin (MUCINEX) 600 MG 12 hr tablet Take 2 tablets (1,200 mg total) by mouth 2 (two) times daily as needed. Patient not taking: Reported on 05/14/2017 03/06/17   Lendon Colonel, NP  Magnesium 200 MG TABS Take 1 tablet (200 mg total) by mouth daily. 03/06/17   Lendon Colonel, NP  metFORMIN (GLUCOPHAGE) 1000 MG tablet Take 1 tablet (1,000 mg total) by mouth 2 (two) times daily with a meal. 04/15/17    Raylene Everts, MD  mometasone-formoterol [facility]) 100-5 MCG/ACT AERO Inhale 2 puffs into the lungs 2 (two) times daily. 02/11/17   Soyla Dryer, PA-C  nicotine (NICODERM CQ - DOSED IN MG/24 HOURS) 21 mg/24hr patch Place 1 patch (21 mg total) onto the skin daily. 04/15/17   Raylene Everts, MD  nitroGLYCERIN (NITROSTAT) 0.4 MG SL tablet Place 1 tablet (0.4 mg total) under the tongue every 5 (five) minutes as needed for chest pain. 02/09/15   Almyra Deforest, PA  Omega-3 Fatty Acids (FISH OIL) 1200 MG CAPS Take 1,200 mg by mouth 2 (two) times daily.    [provider]  pantoprazole (PROTONIX) 40 MG tablet Take 1 tablet (40 mg total) by mouth daily. 10/13/16   Rourk, Cristopher Estimable, MD  prasugrel (EFFIENT) 10 MG TABS tablet Take 1 tablet (10 mg total) by mouth daily. 04/29/17   Cheryln Manly, NP  traZODone (DESYREL) 100 MG tablet Take 100 mg by mouth at bedtime.     [provider]      ANALYSIS: A 16 channel recording using standard 10 20 measurements is conducted for 22 minutes. There is a well-formed posterior dominant rhythm of 7 Hz which attenuates with eye opening. There is beta activity observing the frontal areas. Awake and drowsy activities are observed. Photic stimulation is carried out without abnormal changes in the background activity. There is no focal or lateral slowing. There is no epileptiform  activities observed. Occasional frontal intermittent rhythmic delta activities are observed.   IMPRESSION: 1. Occasional frontal intermittent rhythmic delta activities are observed typically seen in toxic metabolic processes. Otherwise, this is a normal recording of the awake and drowsy states.      Randy Hebert, M.D.  Diplomate, Tax adviser of Psychiatry and Neurology ( Neurology).

## 2017-05-18 NOTE — Progress Notes (Signed)
Discharge instructions and prescription given, verbalized understanding, out in stable condition via w/c with staff.

## 2017-05-19 LAB — DRUG SCREEN 10 W/CONF, SERUM
AMPHETAMINES, IA: NEGATIVE ng/mL
Barbiturates, IA: NEGATIVE ug/mL
Benzodiazepines, IA: NEGATIVE ng/mL
Cocaine & Metabolite, IA: NEGATIVE ng/mL
METHADONE, IA: NEGATIVE ng/mL
OPIATES, IA: NEGATIVE ng/mL
OXYCODONES, IA: NEGATIVE ng/mL
PROPOXYPHENE, IA: NEGATIVE ng/mL
Phencyclidine, IA: NEGATIVE ng/mL
THC(Marijuana) Metabolite, IA: NEGATIVE ng/mL

## 2017-05-20 LAB — CULTURE, BLOOD (ROUTINE X 2)
Culture: NO GROWTH
Culture: NO GROWTH
SPECIAL REQUESTS: ADEQUATE
SPECIAL REQUESTS: ADEQUATE

## 2017-05-25 ENCOUNTER — Ambulatory Visit (INDEPENDENT_AMBULATORY_CARE_PROVIDER_SITE_OTHER): Payer: Medicaid Other

## 2017-05-25 DIAGNOSIS — R55 Syncope and collapse: Secondary | ICD-10-CM

## 2017-05-29 ENCOUNTER — Ambulatory Visit: Payer: Medicaid Other | Attending: Family Medicine | Admitting: Neurology

## 2017-05-29 DIAGNOSIS — G4730 Sleep apnea, unspecified: Secondary | ICD-10-CM

## 2017-05-29 DIAGNOSIS — Z79899 Other long term (current) drug therapy: Secondary | ICD-10-CM | POA: Insufficient documentation

## 2017-05-29 DIAGNOSIS — J449 Chronic obstructive pulmonary disease, unspecified: Secondary | ICD-10-CM | POA: Insufficient documentation

## 2017-05-29 DIAGNOSIS — R0683 Snoring: Secondary | ICD-10-CM | POA: Diagnosis not present

## 2017-05-29 DIAGNOSIS — Z7982 Long term (current) use of aspirin: Secondary | ICD-10-CM | POA: Insufficient documentation

## 2017-05-29 DIAGNOSIS — G4733 Obstructive sleep apnea (adult) (pediatric): Secondary | ICD-10-CM | POA: Insufficient documentation

## 2017-05-29 DIAGNOSIS — Z7984 Long term (current) use of oral hypoglycemic drugs: Secondary | ICD-10-CM | POA: Insufficient documentation

## 2017-06-04 ENCOUNTER — Encounter (HOSPITAL_COMMUNITY): Payer: Self-pay | Admitting: *Deleted

## 2017-06-04 ENCOUNTER — Emergency Department (HOSPITAL_COMMUNITY): Payer: Medicaid Other

## 2017-06-04 ENCOUNTER — Telehealth: Payer: Self-pay | Admitting: Internal Medicine

## 2017-06-04 ENCOUNTER — Emergency Department (HOSPITAL_COMMUNITY)
Admission: EM | Admit: 2017-06-04 | Discharge: 2017-06-04 | Disposition: A | Discharge status: Home / Self Care | Payer: Medicaid Other | Attending: Emergency Medicine | Admitting: Emergency Medicine

## 2017-06-04 DIAGNOSIS — I110 Hypertensive heart disease with heart failure: Secondary | ICD-10-CM | POA: Insufficient documentation

## 2017-06-04 DIAGNOSIS — I455 Other specified heart block: Secondary | ICD-10-CM | POA: Diagnosis not present

## 2017-06-04 DIAGNOSIS — Z7982 Long term (current) use of aspirin: Secondary | ICD-10-CM | POA: Diagnosis not present

## 2017-06-04 DIAGNOSIS — F17210 Nicotine dependence, cigarettes, uncomplicated: Secondary | ICD-10-CM | POA: Insufficient documentation

## 2017-06-04 DIAGNOSIS — J449 Chronic obstructive pulmonary disease, unspecified: Secondary | ICD-10-CM | POA: Insufficient documentation

## 2017-06-04 DIAGNOSIS — J45909 Unspecified asthma, uncomplicated: Secondary | ICD-10-CM | POA: Diagnosis not present

## 2017-06-04 DIAGNOSIS — I2510 Atherosclerotic heart disease of native coronary artery without angina pectoris: Secondary | ICD-10-CM | POA: Diagnosis not present

## 2017-06-04 DIAGNOSIS — I5042 Chronic combined systolic (congestive) and diastolic (congestive) heart failure: Secondary | ICD-10-CM | POA: Insufficient documentation

## 2017-06-04 DIAGNOSIS — Z79899 Other long term (current) drug therapy: Secondary | ICD-10-CM | POA: Insufficient documentation

## 2017-06-04 DIAGNOSIS — E1165 Type 2 diabetes mellitus with hyperglycemia: Secondary | ICD-10-CM | POA: Diagnosis not present

## 2017-06-04 DIAGNOSIS — R002 Palpitations: Secondary | ICD-10-CM | POA: Diagnosis present

## 2017-06-04 DIAGNOSIS — Z96641 Presence of right artificial hip joint: Secondary | ICD-10-CM | POA: Insufficient documentation

## 2017-06-04 DIAGNOSIS — Z955 Presence of coronary angioplasty implant and graft: Secondary | ICD-10-CM | POA: Insufficient documentation

## 2017-06-04 LAB — CBC
HCT: 46.3 % (ref 39.0–52.0)
Hemoglobin: 13.8 g/dL (ref 13.0–17.0)
MCH: 24.3 pg — ABNORMAL LOW (ref 26.0–34.0)
MCHC: 29.8 g/dL — ABNORMAL LOW (ref 30.0–36.0)
MCV: 81.7 fL (ref 78.0–100.0)
PLATELETS: 308 10*3/uL (ref 150–400)
RBC: 5.67 MIL/uL (ref 4.22–5.81)
RDW: 15.9 % — AB (ref 11.5–15.5)
WBC: 9 10*3/uL (ref 4.0–10.5)

## 2017-06-04 LAB — BASIC METABOLIC PANEL
Anion gap: 10 (ref 5–15)
BUN: 9 mg/dL (ref 6–20)
CALCIUM: 8.9 mg/dL (ref 8.9–10.3)
CO2: 29 mmol/L (ref 22–32)
CREATININE: 0.67 mg/dL (ref 0.61–1.24)
Chloride: 95 mmol/L — ABNORMAL LOW (ref 101–111)
GFR calc non Af Amer: >60 mL/min (ref >60)
Glucose, Bld: 227 mg/dL — ABNORMAL HIGH (ref 65–99)
Potassium: 4 mmol/L (ref 3.5–5.1)
SODIUM: 134 mmol/L — AB (ref 135–145)

## 2017-06-04 LAB — I-STAT TROPONIN, ED: TROPONIN I, POC: 0 ng/mL (ref 0.00–0.08)

## 2017-06-04 NOTE — Telephone Encounter (Signed)
Called by preventice services. Patient had a 7 s pause while sleeping he was asymptomatic. Called back but patient not picking up phone. Left message that he should call back or have some bring him to the ED to get evaluated. Cc'ing Dr. [name] to further follow up on this patient.

## 2017-06-04 NOTE — ED Triage Notes (Signed)
Pt is wearing a heart monitor and the people monitoring him called and told him to come to ED; pt states he feels his heart "fluttering" and is c/o dizziness

## 2017-06-04 NOTE — Telephone Encounter (Signed)
Patient had a 7 s pause at home. He was asymptomatic. He was

## 2017-06-04 NOTE — ED Provider Notes (Signed)
[facility] DEPT Provider Note   CSN: [id number] Arrival date & time: 06/04/17  5573     History   Chief Complaint Chief Complaint  Patient presents with  . Palpitations    HPI Randy Hebert is a 51 y.o. male.  He presents for evaluation of palpitations.  He is wearing a cardiac monitor, and has fluttering of his heart.  He was contacted by the monitoring service and instructed to come to the emergency department.  By report, documented earlier, he had a 7 second pause, hence.  He is status post cardiac catheterization with stenting procedure 04/27/17.  He has single-vessel disease.  Post catheterization he had bradycardia.  He wears oxygen chronically.  He continues to smoke.  His event monitor was placed following hospital discharge, 2 weeks ago.  He has obstructive sleep apnea and uses CPAP at night.  He has had documented sinus pauses previously.  Patient denies recent syncope.  He has noticed his heart skipping occasionally.  Patient has not had any chest pain, dizziness, paresthesia, back pain or abdominal pain.  He complains of a headache today.  His fever, chills, cough or change in his chronic respiratory trouble.  He uses his oxygen at home about 70% of the time.  He tries to avoid using oxygen 1 smoking.  There are no other known modifying factors.  HPI  Past Medical History:  Diagnosis Date  . Allergy   . Anxiety   . Arthritis   . Asthma    hip replacement  . Back pain   . Bulging of cervical intervertebral disc   . CAD (coronary artery disease)    lateral STEMI 02/06/2015 00% D1 occlusion treated with Promus Premier 2.5 mm x 16 mm DES, 70% ramus stenosis, 40% mid RCA stenosis, 45% distal RCA stenosis, EF 45-50%  . CHF (congestive heart failure) ([place])   . COPD (chronic obstructive pulmonary disease) ([place])   . Depression   . Diabetes mellitus without complication ([place])   . 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 Dry eye   .  Dyspnea   . Early satiety 09/23/2016  . GERD (gastroesophageal reflux disease)   . Headache   . Heart murmur   . Hip pain   . Hyperlipidemia   . Hypertension   . MI (myocardial infarction) ([place])   . Myocardial infarction ([place])   . Neck pain   . Otitis media   . Pleurisy   . Sinus pause    9 sec sinus pause on telemetry after started on coreg after MI, avoid AV nodal blocking agent  . Sleep apnea   . 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 Unilateral primary osteoarthritis, right hip 07/01/2016    Patient Active Problem List   Diagnosis Date Noted  . HCAP (healthcare-associated pneumonia) 05/15/2017  . Uncontrolled type 2 diabetes mellitus with hyperglycemia, without long-term current use of insulin ([place]) 05/15/2017  . Acute on chronic respiratory failure with hypoxia ([place]) 05/15/2017  . Lobar pneumonia ([place]) 05/15/2017  . Chronic combined systolic and diastolic CHF (congestive heart failure) ([place]) 05/15/2017  . Sinus pause 05/15/2017  . Syncope and collapse 05/15/2017  . Bradycardia 04/28/2017  . Sleep apnea 04/28/2017  . Non-ST elevation (NSTEMI) myocardial infarction ([place]) 04/27/2017  . NSTEMI (non-ST elevated myocardial infarction) ([place]) 04/26/2017  . Substance abuse ([place]) 04/15/2017  . Depression with anxiety 04/15/2017  . GERD (gastroesophageal reflux disease) 09/23/2016  . Status post total  replacement of right hip 07/01/2016  . Type 2 diabetes mellitus without complication, without long-term current use of insulin ([place]) 04/26/2016  . Chronic obstructive pulmonary disease ([place]) 08/27/2015  . Cigarette nicotine dependence with nicotine-induced disorder 08/27/2015  . Hyperlipidemia 08/27/2015  . Obesity, unspecified 08/27/2015  . Acute on chronic systolic heart failure ([place]) 06/02/2015  . ACE inhibitor-aggravated angioedema   . Chronic systolic CHF (congestive heart failure) ([place]) 06/01/2015  . CAD (coronary artery disease) 05/31/2015  . STEMI (ST elevation myocardial  infarction) ([place]) 05/31/2015  . Tobacco use disorder 05/31/2015  . Essential hypertension 02/09/2015    Past Surgical History:  Procedure Laterality Date  . BIOPSY  10/09/2016   Procedure: BIOPSY;  Surgeon: Daneil Dolin, MD;  [facility];  Service: Endoscopy;;  . CARDIAC CATHETERIZATION N/A 02/06/2015   Procedure: Left Heart Cath and Coronary Angiography;  Surgeon: Leonie Man, MD;  Location: [facility];  Service: Cardiovascular;  Laterality: N/A;  . CARDIAC CATHETERIZATION N/A 02/06/2015   Procedure: Coronary Stent Intervention;  Surgeon: Leonie Man, MD;  Location: [facility];  Service: Cardiovascular;  Laterality: N/A;  . COLONOSCOPY WITH PROPOFOL N/A 10/09/2016   Procedure: COLONOSCOPY WITH PROPOFOL;  Surgeon: Daneil Dolin, MD;  [facility];  Service: Endoscopy;  Laterality: N/A;  12:00 pm  . COLONOSCOPY WITH PROPOFOL N/A 11/24/2016   Procedure: COLONOSCOPY WITH PROPOFOL;  Surgeon: Daneil Dolin, MD;  [facility];  Service: Endoscopy;  Laterality: N/A;  200  . CORONARY ANGIOPLASTY WITH STENT PLACEMENT  01/2015  . CORONARY STENT INTERVENTION N/A 04/27/2017   Procedure: CORONARY STENT INTERVENTION;  Surgeon: Nelva Bush, MD;  Location: [facility];  Service: Cardiovascular;  Laterality: N/A;  . ESOPHAGOGASTRODUODENOSCOPY (EGD) WITH PROPOFOL N/A 10/09/2016   Procedure: ESOPHAGOGASTRODUODENOSCOPY (EGD) WITH PROPOFOL;  Surgeon: Daneil Dolin, MD;  [facility];  Service: Endoscopy;  Laterality: N/A;  . INCISION / DRAINAGE HAND / FINGER    . LEFT HEART CATH AND CORONARY ANGIOGRAPHY N/A 04/27/2017   Procedure: LEFT HEART CATH AND CORONARY ANGIOGRAPHY;  Surgeon: Nelva Bush, MD;  Location: [facility];  Service: Cardiovascular;  Laterality: N/A;  . POLYPECTOMY  11/24/2016   Procedure: POLYPECTOMY;  Surgeon: Daneil Dolin, MD;  [facility];  Service: Endoscopy;;  descending and sigmoid  . TOTAL  HIP ARTHROPLASTY Right 07/01/2016  . TOTAL HIP ARTHROPLASTY Right 07/01/2016   Procedure: RIGHT TOTAL HIP ARTHROPLASTY ANTERIOR APPROACH;  Surgeon: Mcarthur Rossetti, MD;  Location: [facility];  Service: Orthopedics;  Laterality: Right;       Home Medications    Prior to Admission medications   Medication Sig Start Date End Date Taking? Authorizing Provider  albuterol (PROVENTIL HFA;VENTOLIN HFA) 108 (90 Base) MCG/ACT inhaler Inhale 2 puffs into the lungs every 6 (six) hours as needed for wheezing or shortness of breath. 04/24/16   Soyla Dryer, PA-C  albuterol (PROVENTIL) (2.5 MG/3ML) 0.083% nebulizer solution Take 3 mLs (2.5 mg total) by nebulization every 6 (six) hours as needed for wheezing or shortness of breath. 05/27/16   Soyla Dryer, PA-C  amoxicillin-clavulanate (AUGMENTIN) 875-125 MG tablet Take 1 tablet by mouth every 12 (twelve) hours. 05/18/17   Orson Eva, MD  aspirin 81 MG EC tablet Take 1 tablet (81 mg total) by mouth daily. 04/29/17   Cheryln Manly, NP  atorvastatin (LIPITOR) 80 MG tablet Take 80 mg by mouth daily.    [provider]  Carboxymeth-Glycerin-Polysorb (REFRESH OPTIVE ADVANCED) 0.5-1-0.5 % SOLN Apply 1 drop to eye 2 (two) times daily as needed (dry eyes).     [provider]  cycloSPORINE (RESTASIS) 0.05 % ophthalmic emulsion Place 1 drop into both eyes 2 (two) times daily.    [provider]  furosemide (LASIX) 20 MG tablet Take 1 tablet (20 mg total) by mouth daily. 04/15/17   Raylene Everts, MD  Magnesium 200 MG TABS Take 1 tablet (200 mg total) by mouth daily. 03/06/17   Lendon Colonel, NP  metFORMIN (GLUCOPHAGE) 1000 MG tablet Take 1 tablet (1,000 mg total) by mouth 2 (two) times daily with a meal. 04/15/17   Raylene Everts, MD  mometasone-formoterol [facility]) 100-5 MCG/ACT AERO Inhale 2 puffs into the lungs 2 (two) times daily. 02/11/17   Soyla Dryer, PA-C  nicotine (NICODERM CQ - DOSED IN MG/24 HOURS) 21  mg/24hr patch Place 1 patch (21 mg total) onto the skin daily. 04/15/17   Raylene Everts, MD  nitroGLYCERIN (NITROSTAT) 0.4 MG SL tablet Place 1 tablet (0.4 mg total) under the tongue every 5 (five) minutes as needed for chest pain. 02/09/15   Almyra Deforest, PA  Omega-3 Fatty Acids (FISH OIL) 1200 MG CAPS Take 1,200 mg by mouth 2 (two) times daily.    [provider]  oxyCODONE (OXY IR/ROXICODONE) 5 MG immediate release tablet Take 1 tablet (5 mg total) by mouth every 6 (six) hours as needed for moderate pain or severe pain. 05/18/17   Orson Eva, MD  pantoprazole (PROTONIX) 40 MG tablet Take 1 tablet (40 mg total) by mouth daily. 10/13/16   Rourk, Cristopher Estimable, MD  prasugrel (EFFIENT) 10 MG TABS tablet Take 1 tablet (10 mg total) by mouth daily. 04/29/17   Cheryln Manly, NP  traZODone (DESYREL) 100 MG tablet Take 100 mg by mouth at bedtime.     [provider]    Family History Family History  Problem Relation Age of Onset  . Heart attack Father   . Stroke Father   . Arthritis Father   . Heart disease Father   . Cancer Mother        ???  . Arthritis Mother   . Heart disease Brother 61       died in sleep  . Early death Brother   . Diabetes Maternal Uncle   . Alzheimer's disease Maternal Grandmother     Social History Social History  Substance Use Topics  . Smoking status: Current Every Day Smoker    Packs/day: 1.00    Years: 46.00    Types: Cigarettes    Start date: 06/07/1979  . Smokeless tobacco: Former Systems developer    Types: Chew  . Alcohol use 6.6 - 7.2 oz/week    5 - 6 Shots of liquor, 6 Cans of beer per week     Comment: alcoholic, now drinks intermittently     Allergies   Carvedilol; Lisinopril; and Amoxicillin   Review of Systems Review of Systems  All other systems reviewed and are negative.    Physical Exam Updated Vital Signs BP (!) 140/97   Pulse 89   Temp 97.7 F (36.5 C) (Oral)   Resp (!) 25   Ht 5\' 5"  (1.651 m)   Wt 100.7 kg (222 lb)    SpO2 94%   BMI 36.94 kg/m   Physical Exam  Constitutional: He is oriented to person, place, and time. He appears well-developed. No distress.  Obese.  Appears older than stated age.  HENT:  Head: Normocephalic and atraumatic.  Right Ear: External ear normal.  Left Ear: External ear normal.  Eyes: Pupils are equal, round, and reactive to light. Conjunctivae and EOM are normal.  Neck: Normal range of motion and phonation normal. Neck supple.  Cardiovascular: Normal rate, regular rhythm and normal heart sounds.   Pulmonary/Chest: Effort normal. He exhibits no bony tenderness.  Decreased air movement bilaterally.  No audible wheezes rales or rhonchi.  Abdominal: Soft. There is no tenderness.  Musculoskeletal: Normal range of motion. He exhibits no edema, tenderness or deformity.  Neurological: He is alert and oriented to person, place, and time. No cranial nerve deficit or sensory deficit. He exhibits normal muscle tone. Coordination normal.  Skin: Skin is warm, dry and intact.  Psychiatric: He has a normal mood and affect. His behavior is normal. Judgment and thought content normal.  Nursing note and vitals reviewed.    ED Treatments / Results  Labs (all labs ordered are listed, but only abnormal results are displayed) Labs Reviewed  BASIC METABOLIC PANEL - Abnormal; Notable for the following:       Result Value   Sodium 134 (*)    Chloride 95 (*)    Glucose, Bld 227 (*)    All other components within normal limits  CBC - Abnormal; Notable for the following:    MCH 24.3 (*)    MCHC 29.8 (*)    RDW 15.9 (*)    All other components within normal limits  I-STAT TROPONIN, ED    EKG  EKG Interpretation  Date/Time:  Thursday June 04 2017 06:48:38 EDT Ventricular Rate:  94 PR Interval:    QRS Duration: 144 QT Interval:  375 QTC Calculation: 469 R Axis:   128 Text Interpretation:  Sinus rhythm Right bundle branch block Lateral infarct, old since last tracing no  significant change Confirmed by Daleen Bo [phone]) on 06/04/2017 7:12:33 AM       Radiology Dg Chest 2 View  Result Date: 06/04/2017 CLINICAL DATA:  Palpitations, dizziness, asthma, CHF, COPD EXAM: CHEST  2 VIEW COMPARISON:  CT chest dated 05/14/2017 FINDINGS: Near complete resolution of prior right lower lobe opacities. Lungs are essentially clear. No pleural effusion or pneumothorax. The heart is normal in size. Degenerative changes of the visualized thoracolumbar spine. IMPRESSION: No evidence of acute cardiopulmonary disease. Electronically Signed   By: Julian Hy M.D.   On: 06/04/2017 07:22    Procedures Procedures (including critical care time)  Medications Ordered in ED Medications - No data to display   Initial Impression / Assessment and Plan / ED Course  I have reviewed the triage vital signs and the nursing notes.  Pertinent labs & imaging results that were available during my care of the patient were reviewed by me and considered in my medical decision making (see chart for details).  Clinical Course as of Jun 05 951  Thu Jun 04, 2017  6283 The case has been discussed with the cardiologist, Dr. Domenic Polite.  We reviewed the case together.  He plans on scheduling the patient to see an electrophysiologist, to determine the need for additional intervention, evaluation and treatment.  He feels that this can be done as an outpatient and will make the appointment.  [EW]  [id number] EP appointment scheduled by cardiology for 06/25/17, at 8:45 AM.  Patient was informed  [EW]    Clinical Course User Index [EW] Daleen Bo, MD     Patient Vitals for  the past 24 hrs:  BP Temp Temp src Pulse Resp SpO2 Height Weight  06/04/17 0730 (!) 140/97 - - 89 (!) 25 94 % - -  06/04/17 0700 (!) 143/97 - - 94 20 93 % - -  06/04/17 0648 (!) 151/102 97.7 F (36.5 C) Oral 99 20 (!) 89 % - -  06/04/17 0646 - - - - - - 5\' 5"  (1.651 m) 100.7 kg (222 lb)    9:42 AM Reevaluation with update  and discussion. After initial assessment and treatment, an updated evaluation reveals he remains comfortable.  There is been no arrhythmia noted monitoring here.  Findings discussed with the patient and all questions were answered. Tocara Mennen L    Final Clinical Impressions(s) / ED Diagnoses   Final diagnoses:  Sinus pause    Patient here at request of cardiology because of sinus pause, 7 seconds, seen on monitor, while at home.  He was asymptomatic for it.  Cardiology contacted for recommendations, they plan outpatient follow-up.  Doubt ACS, PE, pneumonia, significant cardiac arrhythmia that will require inpatient monitoring or intervention at this time.  Nursing Notes Reviewed/ Care Coordinated Applicable Imaging Reviewed Interpretation of Laboratory Data incorporated into ED treatment  The patient appears reasonably screened and/or stabilized for discharge and I doubt any other medical condition or other [facility] requiring further screening, evaluation, or treatment in the ED at this time prior to discharge.  Plan: Home Medications-continue usual medications; Home Treatments-stop smoking; return here if the recommended treatment, does not improve the symptoms; Recommended follow up-EP follow-up 06/25/17, and PCP, as needed.   New Prescriptions New Prescriptions   No medications on file     Daleen Bo, MD 06/04/17 [phone]

## 2017-06-04 NOTE — Progress Notes (Signed)
Received communication from Dr. Eulis Foster regarding Mr. Brimley. He is currently wearing a cardiac monitor for investigation of previously documented sinus pauses and a possible episode of syncope. Cardiology fellow Dr. Eula Fried on call last night received communication from [place] regarding a 7 second pause that occurred overnight while the patient was sleeping. He also has obstructive sleep apnea. He was contacted at home and told to go to the ER, not obviously symptomatic at that point, not reporting any recent syncope. In the ER he was found to be stable, ECG showing sinus rhythm with right bundle-branch block and borderline prolonged PR interval. No hemodynamic instability or chest pain. Troponin I level 0.00. Currently not on any AV nodal blockers. Patient being scheduled for EP consultation in the near future and will continue to wear the monitor. He should not drive at this time pending further evaluation.  Satira Sark, M.D., F.A.C.C.

## 2017-06-04 NOTE — Discharge Instructions (Signed)
The testing today, in the emergency department is normal.  There is no sign of heart attack or other complications at this time.  It is important to continue wearing the cardiac monitor, which you have on currently.  Also use your CPAP, every night.  If you worsen, have concerns, or pass out, you need to come back to the emergency department immediately.  In the meantime make sure you are taking her medicines as directed, avoiding smoking, and try to eat healthy meals.

## 2017-06-04 NOTE — ED Notes (Signed)
Cardiology called to give appt for Pt with Dr. Lovena Le, here at [facility] [address]. Oct 25 at 8:45 am.

## 2017-06-04 NOTE — ED Notes (Signed)
Pt given meal tray per EDP order.

## 2017-06-06 NOTE — Procedures (Signed)
[place] A. Merlene Laughter, MD     [email or web address]             NOCTURNAL POLYSOMNOGRAPHY   LOCATION: [facility]   Patient Name: Randy Hebert, Randy Hebert Date: 05/29/2017 Gender: Male D.O.B: 1966-04-29 Age (years): 51 Referring Provider: Raylene Everts Height (inches): 66 Interpreting Physician: Phillips Odor MD, ABSM Weight (lbs): 232 RPSGT: Peak, Robert BMI: 37 MRN: [id number] Neck Size: 21.00 CLINICAL INFORMATION Sleep Study Type: NPSG  Indication for sleep study: N/A  Epworth Sleepiness Score:  SLEEP STUDY TECHNIQUE As per the AASM Manual for the Scoring of Sleep and Associated Events v2.3 (April 2016) with a hypopnea requiring 4% desaturations.  The channels recorded and monitored were frontal, central and occipital EEG, electrooculogram (EOG), submentalis EMG (chin), nasal and oral airflow, thoracic and abdominal wall motion, anterior tibialis EMG, snore microphone, electrocardiogram, and pulse oximetry.  MEDICATIONS Medications self-administered by patient taken the night of the study : N/A  Current Outpatient Prescriptions:  .  albuterol (PROVENTIL HFA;VENTOLIN HFA) 108 (90 Base) MCG/ACT inhaler, Inhale 2 puffs into the lungs every 6 (six) hours as needed for wheezing or shortness of breath., Disp: 1 Inhaler, Rfl: 4 .  albuterol (PROVENTIL) (2.5 MG/3ML) 0.083% nebulizer solution, Take 3 mLs (2.5 mg total) by nebulization every 6 (six) hours as needed for wheezing or shortness of breath., Disp: 150 mL, Rfl: 1 .  amoxicillin-clavulanate (AUGMENTIN) 875-125 MG tablet, Take 1 tablet by mouth every 12 (twelve) hours., Disp: 8 tablet, Rfl: 0 .  aspirin 81 MG EC tablet, Take 1 tablet (81 mg total) by mouth daily., Disp: 30 tablet, Rfl:  .  atorvastatin (LIPITOR) 80 MG tablet, Take 80 mg by mouth daily., Disp: , Rfl:  .  Carboxymeth-Glycerin-Polysorb (REFRESH OPTIVE ADVANCED) 0.5-1-0.5 % SOLN, Apply 1 drop to eye 2 (two) times daily as needed (dry  eyes). , Disp: , Rfl:  .  cycloSPORINE (RESTASIS) 0.05 % ophthalmic emulsion, Place 1 drop into both eyes 2 (two) times daily., Disp: , Rfl:  .  furosemide (LASIX) 20 MG tablet, Take 1 tablet (20 mg total) by mouth daily., Disp: 30 tablet, Rfl: 3 .  Magnesium 200 MG TABS, Take 1 tablet (200 mg total) by mouth daily., Disp: 30 each, Rfl: 6 .  metFORMIN (GLUCOPHAGE) 1000 MG tablet, Take 1 tablet (1,000 mg total) by mouth 2 (two) times daily with a meal., Disp: 180 tablet, Rfl: 3 .  mometasone-formoterol (DULERA) 100-5 MCG/ACT AERO, Inhale 2 puffs into the lungs 2 (two) times daily., Disp: 1 Inhaler, Rfl: 4 .  nicotine (NICODERM CQ - DOSED IN MG/24 HOURS) 21 mg/24hr patch, Place 1 patch (21 mg total) onto the skin daily., Disp: 28 patch, Rfl: 0 .  nitroGLYCERIN (NITROSTAT) 0.4 MG SL tablet, Place 1 tablet (0.4 mg total) under the tongue every 5 (five) minutes as needed for chest pain., Disp: 25 tablet, Rfl: 3 .  Omega-3 Fatty Acids (FISH OIL) 1200 MG CAPS, Take 1,200 mg by mouth 2 (two) times daily., Disp: , Rfl:  .  oxyCODONE (OXY IR/ROXICODONE) 5 MG immediate release tablet, Take 1 tablet (5 mg total) by mouth every 6 (six) hours as needed for moderate pain or severe pain., Disp: 12 tablet, Rfl: 0 .  pantoprazole (PROTONIX) 40 MG tablet, Take 1 tablet (40 mg total) by mouth daily., Disp: 30 tablet, Rfl: 11 .  prasugrel (EFFIENT) 10 MG TABS tablet, Take 1 tablet (10 mg total) by mouth daily., Disp: 90 tablet, Rfl: 1 .  traZODone (DESYREL)  100 MG tablet, Take 100 mg by mouth at bedtime. , Disp: , Rfl:    SLEEP ARCHITECTURE The study was initiated at 9:48:58 PM and ended at 4:16:29 AM.  Sleep onset time was 0.9 minutes and the sleep efficiency was 91.5%. The total sleep time was 354.6 minutes.  Stage REM latency was 26.0 minutes.  The patient spent 7.19% of the night in stage N1 sleep, 77.58% in stage N2 sleep, 0.00% in stage N3 and 15.23% in REM.  Alpha intrusion was absent.  Supine sleep was  0.00%.  RESPIRATORY PARAMETERS The overall apnea/hypopnea index (AHI) was 93.4 per hour. There were 253 total apneas, including 251 obstructive, 2 central and 0 mixed apneas. There were 299 hypopneas and 1 RERAs.  The AHI during Stage REM sleep was 100.0 per hour.  AHI while supine was N/A per hour.  The mean oxygen saturation was 83.06%. The minimum SpO2 during sleep was 51.00%.  loud snoring was noted during this study.  CARDIAC DATA The 2 lead EKG demonstrated sinus rhythm. The mean heart rate was 66.41 beats per minute. Other EKG findings include: None. LEG MOVEMENT DATA The total PLMS were 0 with a resulting PLMS index of 0.00. Associated arousal with leg movement index was 0.0.  IMPRESSIONS - Severe obstructive sleep apnea worse during REM sleep is observed during this study. AutoPap 8-14 is recommended. -  Absent slow wave sleep is observed.  Delano Metz, MD Diplomate, American Board of Sleep Medicine.  ELECTRONICALLY SIGNED ON:  06/06/2017, 5:24 PM [place] PH: [phone]   FX: [phone] [place]

## 2017-06-08 ENCOUNTER — Ambulatory Visit (INDEPENDENT_AMBULATORY_CARE_PROVIDER_SITE_OTHER): Payer: Medicaid Other | Admitting: Physician Assistant

## 2017-06-08 ENCOUNTER — Ambulatory Visit (INDEPENDENT_AMBULATORY_CARE_PROVIDER_SITE_OTHER): Payer: Medicaid Other

## 2017-06-08 ENCOUNTER — Encounter (INDEPENDENT_AMBULATORY_CARE_PROVIDER_SITE_OTHER): Payer: Self-pay | Admitting: Physician Assistant

## 2017-06-08 DIAGNOSIS — Z96641 Presence of right artificial hip joint: Secondary | ICD-10-CM

## 2017-06-08 NOTE — Progress Notes (Signed)
Office Visit Note   Patient: Randy Hebert           Date of Birth: 05-20-1966           MRN: [id number] Visit Date: 06/08/2017              Requested by: Randy Dryer, PA-C [address] [place], [place] [address] PCP: Randy Everts, MD   Assessment & Plan: Visit Diagnoses:  1. Status post total hip replacement, right     Plan: Discussed with him IT band stretching to quit for his left hip. Otherwise he'll follow with [place] on an as-needed basis. Questions encouraged and answered.  Follow-Up Instructions: Return if symptoms worsen or fail to improve.   Orders:  Orders Placed This Encounter  Procedures  . XR HIP UNILAT W OR W/O PELVIS 2-3 VIEWS RIGHT   No orders of the defined types were placed in this encounter.     Procedures: No procedures performed   Clinical Data: No additional findings.   Subjective: Chief Complaint  Patient presents with  . Right Hip - Follow-up    9 Months post op Right THA    HPI Randy Hebert returns today for follow-up of his right hip which is now approximately 1 year postop. He states right hip is overall doing good. He is getting around well. Has no complaints in regards to the right hip. Left hip he has periodic pain lateral aspect of the hip. No groin pain left hip. Since we've last seen him knees have another MRI, he's also been diagnosed with sleep apnea. States that he may require pacemaker in the near future. Review of Systems Positive for left hip pain periodically  Objective: Vital Signs: There were no vitals taken for this visit.  Physical Exam  Constitutional: He is oriented to person, place, and time. He appears well-developed and well-nourished. No distress.  Pulmonary/Chest: Effort normal.  Neurological: He is alert and oriented to person, place, and time.  Skin: He is not diaphoretic.  Psychiatric: He has a normal mood and affect.    Ortho Exam Right hip good range of motion without pain. He has no pain  with internal/external rotation of either hip. Slight tenderness over the left trochanteric region. Dorsiflexion plantar flexion bilateral ankles intact. Specialty Comments:  No specialty comments available.  Imaging: Xr Hip Unilat W Or W/o Pelvis 2-3 Views Right  Result Date: 06/08/2017 [facility] pelvis and lateral view right hip: No acute fracture. Bilateral hips well located. Left hips well-maintained. Right hip status post total hip arthroplasty without any complications. Implants are all well seated.    PMFS History: Patient Active Problem List   Diagnosis Date Noted  . HCAP (healthcare-associated pneumonia) 05/15/2017  . Uncontrolled type 2 diabetes mellitus with hyperglycemia, without long-term current use of insulin ([place]) 05/15/2017  . Acute on chronic respiratory failure with hypoxia ([place]) 05/15/2017  . Lobar pneumonia ([place]) 05/15/2017  . Chronic combined systolic and diastolic CHF (congestive heart failure) ([place]) 05/15/2017  . Sinus pause 05/15/2017  . Syncope and collapse 05/15/2017  . Bradycardia 04/28/2017  . Sleep apnea 04/28/2017  . Non-ST elevation (NSTEMI) myocardial infarction ([place]) 04/27/2017  . NSTEMI (non-ST elevated myocardial infarction) ([place]) 04/26/2017  . Substance abuse ([place]) 04/15/2017  . Depression with anxiety 04/15/2017  . GERD (gastroesophageal reflux disease) 09/23/2016  . 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 Type 2 diabetes mellitus without complication, without long-term current use of insulin ([place]) 04/26/2016  .  Chronic obstructive pulmonary disease ([place]) 08/27/2015  . Cigarette nicotine dependence with nicotine-induced disorder 08/27/2015  . Hyperlipidemia 08/27/2015  . Obesity, unspecified 08/27/2015  . Acute on chronic systolic heart failure ([place]) 06/02/2015  . ACE inhibitor-aggravated angioedema   . Chronic systolic CHF (congestive heart failure) ([place]) 06/01/2015  . CAD (coronary artery disease) 05/31/2015  . STEMI (ST  elevation myocardial infarction) ([place]) 05/31/2015  . Tobacco use disorder 05/31/2015  . Essential hypertension 02/09/2015   Past Medical History:  Diagnosis Date  . Allergy   . Anxiety   . Arthritis   . Asthma    hip replacement  . Back pain   . Bulging of cervical intervertebral disc   . CAD (coronary artery disease)    lateral STEMI 02/06/2015 00% D1 occlusion treated with Promus Premier 2.5 mm x 16 mm DES, 70% ramus stenosis, 40% mid RCA stenosis, 45% distal RCA stenosis, EF 45-50%  . CHF (congestive heart failure) ([place])   . COPD (chronic obstructive pulmonary disease) ([place])   . Depression   . Diabetes mellitus without complication ([place])   . 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 Dry eye   . Dyspnea   . Early satiety 09/23/2016  . GERD (gastroesophageal reflux disease)   . Headache   . Heart murmur   . Hip pain   . Hyperlipidemia   . Hypertension   . MI (myocardial infarction) ([place])   . Myocardial infarction ([place])   . Neck pain   . Otitis media   . Pleurisy   . Sinus pause    9 sec sinus pause on telemetry after started on coreg after MI, avoid AV nodal blocking agent  . Sleep apnea   . 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 Unilateral primary osteoarthritis, right hip 07/01/2016    Family History  Problem Relation Age of Onset  . Heart attack Father   . Stroke Father   . Arthritis Father   . Heart disease Father   . Cancer Mother        ???  . Arthritis Mother   . Heart disease Brother 76       died in sleep  . Early death Brother   . Diabetes Maternal Uncle   . Alzheimer's disease Maternal Grandmother     Past Surgical History:  Procedure Laterality Date  . BIOPSY  10/09/2016   Procedure: BIOPSY;  Surgeon: Daneil Dolin, MD;  [facility];  Service: Endoscopy;;  . CARDIAC CATHETERIZATION N/A 02/06/2015   Procedure: Left Heart Cath and Coronary Angiography;  Surgeon: Leonie Man, MD;  Location: [facility];  Service: Cardiovascular;  Laterality: N/A;  . CARDIAC CATHETERIZATION N/A 02/06/2015   Procedure: Coronary Stent Intervention;  Surgeon: Leonie Man, MD;  Location: [facility];  Service: Cardiovascular;  Laterality: N/A;  . COLONOSCOPY WITH PROPOFOL N/A 10/09/2016   Procedure: COLONOSCOPY WITH PROPOFOL;  Surgeon: Daneil Dolin, MD;  [facility];  Service: Endoscopy;  Laterality: N/A;  12:00 pm  . COLONOSCOPY WITH PROPOFOL N/A 11/24/2016   Procedure: COLONOSCOPY WITH PROPOFOL;  Surgeon: Daneil Dolin, MD;  [facility];  Service: Endoscopy;  Laterality: N/A;  200  . CORONARY ANGIOPLASTY WITH STENT PLACEMENT  01/2015  . CORONARY STENT INTERVENTION N/A 04/27/2017   Procedure: CORONARY STENT INTERVENTION;  Surgeon: Nelva Bush, MD;  Location: [facility];  Service: Cardiovascular;  Laterality: N/A;  . ESOPHAGOGASTRODUODENOSCOPY (EGD) WITH PROPOFOL N/A 10/09/2016   Procedure: ESOPHAGOGASTRODUODENOSCOPY (EGD) WITH PROPOFOL;  Surgeon: Daneil Dolin, MD;  [facility];  Service: Endoscopy;  Laterality: N/A;  . INCISION / DRAINAGE HAND / FINGER    . LEFT HEART CATH AND CORONARY ANGIOGRAPHY N/A 04/27/2017   Procedure: LEFT HEART CATH AND CORONARY ANGIOGRAPHY;  Surgeon: Nelva Bush, MD;  Location: [facility];  Service: Cardiovascular;  Laterality: N/A;  . POLYPECTOMY  11/24/2016   Procedure: POLYPECTOMY;  Surgeon: Daneil Dolin, MD;  [facility];  Service: Endoscopy;;  descending and sigmoid  . TOTAL HIP ARTHROPLASTY Right 07/01/2016  . TOTAL HIP ARTHROPLASTY Right 07/01/2016   Procedure: RIGHT TOTAL HIP ARTHROPLASTY ANTERIOR APPROACH;  Surgeon: Mcarthur Rossetti, MD;  Location: [facility];  Service: Orthopedics;  Laterality: Right;   Social History   Occupational History  . unemployed     Nature conservation officer  .      disability pending   Social History Main Topics  . Smoking status: Current Every Day  Smoker    Packs/day: 1.00    Years: 46.00    Types: Cigarettes    Start date: 06/07/1979  . Smokeless tobacco: Former Systems developer    Types: Chew  . Alcohol use 6.6 - 7.2 oz/week    5 - 6 Shots of liquor, 6 Cans of beer per week     Comment: alcoholic, now drinks intermittently  . Drug use: No     Comment: history of drug use- marijuana, cocaine- quit 2014  . Sexual activity: Yes    Partners: Female    Birth control/ protection: None

## 2017-06-12 ENCOUNTER — Ambulatory Visit: Payer: Medicaid Other | Admitting: Family Medicine

## 2017-06-17 ENCOUNTER — Encounter: Payer: Self-pay | Admitting: Family Medicine

## 2017-06-17 ENCOUNTER — Ambulatory Visit (INDEPENDENT_AMBULATORY_CARE_PROVIDER_SITE_OTHER): Payer: Medicaid Other | Admitting: Family Medicine

## 2017-06-17 VITALS — BP 136/80 | HR 100 | Temp 99.0°F | Resp 24 | Ht 65.0 in | Wt 232.0 lb

## 2017-06-17 DIAGNOSIS — J449 Chronic obstructive pulmonary disease, unspecified: Secondary | ICD-10-CM | POA: Diagnosis not present

## 2017-06-17 DIAGNOSIS — I10 Essential (primary) hypertension: Secondary | ICD-10-CM | POA: Diagnosis not present

## 2017-06-17 DIAGNOSIS — F418 Other specified anxiety disorders: Secondary | ICD-10-CM

## 2017-06-17 DIAGNOSIS — F17200 Nicotine dependence, unspecified, uncomplicated: Secondary | ICD-10-CM

## 2017-06-17 DIAGNOSIS — I2510 Atherosclerotic heart disease of native coronary artery without angina pectoris: Secondary | ICD-10-CM | POA: Diagnosis not present

## 2017-06-17 DIAGNOSIS — Z23 Encounter for immunization: Secondary | ICD-10-CM

## 2017-06-17 MED ORDER — LORAZEPAM 0.5 MG PO TABS: 0.5000 mg | ORAL_TABLET | Freq: Two times a day (BID) | ORAL | 0 refills | Status: DC | PRN

## 2017-06-17 MED ORDER — MOMETASONE FURO-FORMOTEROL FUM 100-5 MCG/ACT IN AERO: 2.0000 | INHALATION_SPRAY | Freq: Two times a day (BID) | RESPIRATORY_TRACT | 3 refills | Status: DC

## 2017-06-17 NOTE — Progress Notes (Signed)
Called by telemetry company. He had a 12 and 5 sec pause during the night. He does not remember the episodes. No symptoms. Was asleep.   I called the patient. He is symptom free. No falls. He will see PCP today. I also advised to make appt with cardiologist ASAP. He will hold his BB in the mean time.   Idamay Hosein
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
## 2017-06-17 NOTE — Patient Instructions (Addendum)
Call the cardiologist for instructions after the monitor alarm  Continue same medicines Take the ativan as needed for significant anxiety  Try to reduce or quit smoking  Activity as tolerated  See me in 3 months Call sooner for probmlems

## 2017-06-17 NOTE — Progress Notes (Signed)
Called by telemetry company. He had a 12 and 5 sec pause during the night. He does not remember the episodes. No symptoms. Was asleep.   I called the patient. He is symptom free. No falls. He will see PCP today. I also advised to make appt with cardiologist ASAP. He will hold his BB in the mean time.   Brianne Maina

## 2017-06-19 ENCOUNTER — Telehealth: Payer: Self-pay

## 2017-06-19 NOTE — Telephone Encounter (Signed)
Received faxed report from [employer] for 3.1 sec pause,faxed to Dr.Branch for review,signed by Dr.Branch, no orders given,sent to be scanned

## 2017-06-22 ENCOUNTER — Encounter (HOSPITAL_COMMUNITY)
Admission: RE | Admit: 2017-06-22 | Discharge: 2017-06-22 | Disposition: A | Discharge status: Home / Self Care | Payer: Medicaid Other | Source: Ambulatory Visit | Attending: Cardiology | Admitting: Cardiology

## 2017-06-22 ENCOUNTER — Telehealth: Payer: Self-pay | Admitting: Cardiology

## 2017-06-22 VITALS — BP 124/88 | HR 77 | Ht 66.0 in | Wt 230.6 lb

## 2017-06-22 DIAGNOSIS — I214 Non-ST elevation (NSTEMI) myocardial infarction: Secondary | ICD-10-CM | POA: Diagnosis present

## 2017-06-22 DIAGNOSIS — F17210 Nicotine dependence, cigarettes, uncomplicated: Secondary | ICD-10-CM | POA: Insufficient documentation

## 2017-06-22 DIAGNOSIS — Z955 Presence of coronary angioplasty implant and graft: Secondary | ICD-10-CM

## 2017-06-22 NOTE — Progress Notes (Signed)
Cardiac Individual Treatment Plan  Patient Details  Name: Randy Hebert MRN: [id number] Date of Birth: 03/05/1966 Referring Provider:     [place] from 06/22/2017 in [facility]  Referring Provider  Dr. [name]      Initial Encounter Date:    CARDIAC REHAB PHASE II ORIENTATION from 06/22/2017 in [place]  Date  06/22/17  Referring Provider  Dr. [name]      Visit Diagnosis: NSTEMI (non-ST elevated myocardial infarction) [facility])  Status post coronary artery stent placement  Patient's Home Medications on Admission:  Current Outpatient Prescriptions:  .  acetaminophen (TYLENOL) 325 MG tablet, Take 650 mg by mouth every 6 (six) hours as needed for mild pain., Disp: , Rfl:  .  albuterol (PROVENTIL HFA;VENTOLIN HFA) 108 (90 Base) MCG/ACT inhaler, Inhale 2 puffs into the lungs every 6 (six) hours as needed for wheezing or shortness of breath., Disp: 1 Inhaler, Rfl: 4 .  albuterol (PROVENTIL) (2.5 MG/3ML) 0.083% nebulizer solution, Take 3 mLs (2.5 mg total) by nebulization every 6 (six) hours as needed for wheezing or shortness of breath., Disp: 150 mL, Rfl: 1 .  aspirin 81 MG EC tablet, Take 1 tablet (81 mg total) by mouth daily., Disp: 30 tablet, Rfl:  .  atorvastatin (LIPITOR) 80 MG tablet, Take 80 mg by mouth daily., Disp: , Rfl:  .  Carboxymeth-Glycerin-Polysorb (REFRESH OPTIVE ADVANCED) 0.5-1-0.5 % SOLN, Apply 1 drop to eye 2 (two) times daily as needed (dry eyes). , Disp: , Rfl:  .  cycloSPORINE (RESTASIS) 0.05 % ophthalmic emulsion, Place 1 drop into both eyes 2 (two) times daily., Disp: , Rfl:  .  furosemide (LASIX) 20 MG tablet, Take 1 tablet (20 mg total) by mouth daily., Disp: 30 tablet, Rfl: 3 .  LORazepam (ATIVAN) 0.5 MG tablet, Take 1 tablet (0.5 mg total) by mouth 2 (two) times daily as needed for anxiety., Disp: 30 tablet, Rfl: 0 .  Magnesium 200 MG TABS, Take 1 tablet (200 mg total) by mouth daily.,  Disp: 30 each, Rfl: 6 .  metFORMIN (GLUCOPHAGE) 1000 MG tablet, Take 1 tablet (1,000 mg total) by mouth 2 (two) times daily with a meal., Disp: 180 tablet, Rfl: 3 .  mometasone-formoterol (DULERA) 100-5 MCG/ACT AERO, Inhale 2 puffs into the lungs 2 (two) times daily., Disp: 3 Inhaler, Rfl: 3 .  nicotine (NICODERM CQ - DOSED IN MG/24 HOURS) 21 mg/24hr patch, Place 1 patch (21 mg total) onto the skin daily. (Patient taking differently: Place 21 mg onto the skin daily. ), Disp: 28 patch, Rfl: 0 .  nitroGLYCERIN (NITROSTAT) 0.4 MG SL tablet, Place 1 tablet (0.4 mg total) under the tongue every 5 (five) minutes as needed for chest pain., Disp: 25 tablet, Rfl: 3 .  Omega-3 Fatty Acids (FISH OIL) 1200 MG CAPS, Take 1,200 mg by mouth 2 (two) times daily., Disp: , Rfl:  .  pantoprazole (PROTONIX) 40 MG tablet, Take 1 tablet (40 mg total) by mouth daily., Disp: 30 tablet, Rfl: 11 .  prasugrel (EFFIENT) 10 MG TABS tablet, Take 1 tablet (10 mg total) by mouth daily., Disp: 90 tablet, Rfl: 1 .  traZODone (DESYREL) 100 MG tablet, Take 100 mg by mouth at bedtime. , Disp: , Rfl:   Past Medical History: Past Medical History:  Diagnosis Date  . Allergy   . Anxiety   . Arthritis   . Asthma    hip replacement  . Back pain   . Bulging of cervical  intervertebral disc   . CAD (coronary artery disease)    lateral STEMI 02/06/2015 00% D1 occlusion treated with Promus Premier 2.5 mm x 16 mm DES, 70% ramus stenosis, 40% mid RCA stenosis, 45% distal RCA stenosis, EF 45-50%  . CHF (congestive heart failure) ([place])   . COPD (chronic obstructive pulmonary disease) ([place])   . Depression   . Diabetes mellitus without complication ([place])   . 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 Dry eye   . Dyspnea   . Early satiety 09/23/2016  . GERD (gastroesophageal reflux disease)   . Headache   . Heart murmur   . Hip pain   . Hyperlipidemia   . Hypertension   . MI (myocardial infarction)  ([place])   . Myocardial infarction ([place])   . Neck pain   . Otitis media   . Pleurisy   . Sinus pause    9 sec sinus pause on telemetry after started on coreg after MI, avoid AV nodal blocking agent  . Sleep apnea   . 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 Unilateral primary osteoarthritis, right hip 07/01/2016    Tobacco Use: History  Smoking Status  . Current Every Day Smoker  . Packs/day: 1.00  . Years: 46.00  . Types: Cigarettes  . Start date: 06/07/1979  Smokeless Tobacco  . Former Systems developer  . Types: Chew    Labs: Recent Review Flowsheet Data    Labs for ITP Cardiac and Pulmonary Rehab Latest Ref Rng & Units 05/20/2016 08/05/2016 11/11/2016 04/15/2017 04/27/2017   Cholestrol 0 - 200 mg/dL 128 132 112 134 93   LDLCALC 0 - 99 mg/dL 60 56 49 71 34   HDL >40 mg/dL 24(L) 22(L) 20(L) 24(L) 18(L)   Trlycerides <150 mg/dL 222(H) 272(H) 214(H) 193(H) 206(H)   Hemoglobin A1c <5.7 % 8.0(H) 6.3(H) 6.8(H) 7.6(H) -   PHART 7.350 - 7.450 - - - - -   PCO2ART 35.0 - 45.0 mmHg - - - - -   HCO3 20.0 - 24.0 mEq/L - - - - -   TCO2 0 - 100 mmol/L - - - - -   O2SAT % - - - - -      Capillary Blood Glucose: Lab Results  Component Value Date   GLUCAP 167 (H) 05/18/2017   GLUCAP 205 (H) 05/18/2017   GLUCAP 269 (H) 05/17/2017   GLUCAP 137 (H) 05/17/2017   GLUCAP 205 (H) 05/17/2017     Exercise Target Goals: Date: 06/22/17  Exercise Program Goal: Individual exercise prescription set with THRR, safety & activity barriers. Participant demonstrates ability to understand and report RPE using BORG scale, to self-measure pulse accurately, and to acknowledge the importance of the exercise prescription.  Exercise Prescription Goal: Starting with aerobic activity 30 plus minutes a day, 3 days per week for initial exercise prescription. Provide home exercise prescription and guidelines that participant acknowledges understanding prior to discharge.  Activity Barriers & Risk Stratification:      Activity Barriers & Cardiac Risk Stratification - 06/22/17 1340      Activity Barriers & Cardiac Risk Stratification   Activity Barriers Arthritis;Back Problems;Right Hip Replacement;Joint Problems;Deconditioning;Muscular Weakness;Shortness of Breath;Balance Concerns   Cardiac Risk Stratification High      6 Minute Walk:     6 Minute Walk    Row Name 06/22/17 1340         6 Minute Walk   Phase Initial  Distance 1000 feet     Distance % Change 0 %     Distance Feet Change 0 ft     Walk Time 6 minutes     # of Rest Breaks 0     MPH 1.89     METS 1.59     RPE 10     Perceived Dyspnea  13     VO2 Peak 11.3     Symptoms No     Resting HR 77 bpm     Resting BP 124/88     Resting Oxygen Saturation  95 %     Exercise Oxygen Saturation  during 6 min walk 91 %     Max Ex. HR 103 bpm     Max Ex. BP 184/100     2 Minute Post BP 126/84        Oxygen Initial Assessment:   Oxygen Re-Evaluation:   Oxygen Discharge (Final Oxygen Re-Evaluation):   Initial Exercise Prescription:     Initial Exercise Prescription - 06/22/17 1300      Date of Initial Exercise RX and Referring Provider   Date 06/22/17   Referring Provider Dr. [name]     NuStep   Level 2   SPM 50   Minutes 20   METs 1.7     Arm Ergometer   Level 1.5   Watts 12   RPM 12   Minutes 15   METs 1.9     Prescription Details   Frequency (times per week) 3   Duration Progress to 30 minutes of continuous aerobic without signs/symptoms of physical distress     Intensity   THRR 40-80% of Max Heartrate 113-132-150   Ratings of Perceived Exertion 11-13   Perceived Dyspnea 0-4     Progression   Progression Continue progressive overload as per policy without signs/symptoms or physical distress.     Resistance Training   Training Prescription Yes   Weight 1   Reps 10-15      Perform Capillary Blood Glucose checks as needed.  Exercise Prescription Changes:   Exercise Comments:   Exercise  Goals and Review:      Exercise Goals    Row Name 06/22/17 1342             Exercise Goals   Increase Physical Activity Yes       Intervention Provide advice, education, support and counseling about physical activity/exercise needs.;Develop an individualized exercise prescription for aerobic and resistive training based on initial evaluation findings, risk stratification, comorbidities and participant's personal goals.       Expected Outcomes Achievement of increased cardiorespiratory fitness and enhanced flexibility, muscular endurance and strength shown through measurements of functional capacity and personal statement of participant.       Increase Strength and Stamina Yes       Intervention Provide advice, education, support and counseling about physical activity/exercise needs.;Develop an individualized exercise prescription for aerobic and resistive training based on initial evaluation findings, risk stratification, comorbidities and participant's personal goals.       Expected Outcomes Achievement of increased cardiorespiratory fitness and enhanced flexibility, muscular endurance and strength shown through measurements of functional capacity and personal statement of participant.       Able to understand and use rate of perceived exertion (RPE) scale Yes       Intervention Provide education and explanation on how to use RPE scale       Expected Outcomes Short Term: Able to use RPE daily in  rehab to express subjective intensity level;Long Term:  Able to use RPE to guide intensity level when exercising independently       Able to understand and use Dyspnea scale Yes       Intervention Provide education and explanation on how to use Dyspnea scale       Expected Outcomes Short Term: Able to use Dyspnea scale daily in rehab to express subjective sense of shortness of breath during exertion;Long Term: Able to use Dyspnea scale to guide intensity level when exercising independently        Knowledge and understanding of Target Heart Rate Range (THRR) Yes       Intervention Provide education and explanation of THRR including how the numbers were predicted and where they are located for reference       Expected Outcomes Short Term: Able to state/look up THRR       Able to check pulse independently Yes       Intervention Provide education and demonstration on how to check pulse in carotid and radial arteries.;Review the importance of being able to check your own pulse for safety during independent exercise       Expected Outcomes Short Term: Able to explain why pulse checking is important during independent exercise;Long Term: Able to check pulse independently and accurately       Understanding of Exercise Prescription Yes       Intervention Provide education, explanation, and written materials on patient's individual exercise prescription       Expected Outcomes Short Term: Able to explain program exercise prescription;Long Term: Able to explain home exercise prescription to exercise independently          Exercise Goals Re-Evaluation :    Discharge Exercise Prescription (Final Exercise Prescription Changes):   Nutrition:  Target Goals: Understanding of nutrition guidelines, daily intake of sodium 1500mg , cholesterol 200mg , calories 30% from fat and 7% or less from saturated fats, daily to have 5 or more servings of fruits and vegetables.  Biometrics:     Pre Biometrics - 06/22/17 1343      Pre Biometrics   Height 5\' 6"  (1.676 m)   Weight 230 lb 9.6 oz (104.6 kg)   Waist Circumference 50 inches   Hip Circumference 43 inches   Waist to Hip Ratio 1.16 %   BMI (Calculated) 37.24   Triceps Skinfold 6 mm   % Body Fat 32 %   Grip Strength 62.33 kg   Flexibility 0 in   Single Leg Stand 15 seconds       Nutrition Therapy Plan and Nutrition Goals:     Nutrition Therapy & Goals - 06/22/17 1526      Personal Nutrition Goals   Personal Goal #2 Still eating heart  healthy, diabetic diet, low sodium   Additional Goals? No      Nutrition Discharge: Rate Your Plate Scores:     Nutrition Assessments - 06/22/17 1526      MEDFICTS Scores   Pre Score 64      Nutrition Goals Re-Evaluation:   Nutrition Goals Discharge (Final Nutrition Goals Re-Evaluation):   Psychosocial: Target Goals: Acknowledge presence or absence of significant depression and/or stress, maximize coping skills, provide positive support system. Participant is able to verbalize types and ability to use techniques and skills needed for reducing stress and depression.  Initial Review & Psychosocial Screening:     Initial Psych Review & Screening - 06/22/17 1530      Initial Review   Current  issues with Current Depression;Current Anxiety/Panic     Family Dynamics   Good Support System? Yes     Barriers   Psychosocial barriers to participate in program There are no identifiable barriers or psychosocial needs.;The patient should benefit from training in stress management and relaxation.     Screening Interventions   Interventions Encouraged to exercise;Provide feedback about the scores to participant      Quality of Life Scores:     Quality of Life - 06/22/17 1344      Quality of Life Scores   Health/Function Pre 9 %   Socioeconomic Pre 13 %   Psych/Spiritual Pre 17 %   Family Pre 16.8 %   GLOBAL Pre 12.64 %      PHQ-9: Recent Review Flowsheet Data    Depression screen [facility] 2/9 06/22/2017 06/17/2017 04/17/2017 04/15/2017   Decreased Interest 2 0 0 0   Down, Depressed, Hopeless 2 0 0 0   PHQ - 2 Score 4 0 0 0   Altered sleeping 2 - - -   Tired, decreased energy 2 - - -   Change in appetite 3 - - -   Feeling bad or failure about yourself  3 - - -   Trouble concentrating 1 - - -   Moving slowly or fidgety/restless 0 - - -   Suicidal thoughts 0 - - -   PHQ-9 Score 15 - - -   Difficult doing work/chores Extremely dIfficult - - -     Interpretation of Total  Score  Total Score Depression Severity:  1-4 = Minimal depression, 5-9 = Mild depression, 10-14 = Moderate depression, 15-19 = Moderately severe depression, 20-27 = Severe depression   Psychosocial Evaluation and Intervention:     Psychosocial Evaluation - 06/22/17 1531      Psychosocial Evaluation & Interventions   Interventions Relaxation education;Encouraged to exercise with the program and follow exercise prescription   Continue Psychosocial Services  Follow up required by staff      Psychosocial Re-Evaluation:   Psychosocial Discharge (Final Psychosocial Re-Evaluation):   Vocational Rehabilitation: Provide vocational rehab assistance to qualifying candidates.   Vocational Rehab Evaluation & Intervention:     Vocational Rehab - 06/22/17 1524      Initial Vocational Rehab Evaluation & Intervention   Assessment shows need for Vocational Rehabilitation No      Education: Education Goals: Education classes will be provided on a weekly basis, covering required topics. Participant will state understanding/return demonstration of topics presented.  Learning Barriers/Preferences:     Learning Barriers/Preferences - 06/22/17 1520      Learning Barriers/Preferences   Learning Barriers None   Learning Preferences Skilled Demonstration;Individual Instruction;Group Instruction;Video;Written Material;Pictoral      Education Topics: Hypertension, Hypertension Reduction -Define heart disease and high blood pressure. Discus how high blood pressure affects the body and ways to reduce high blood pressure.   Exercise and Your Heart -Discuss why it is important to exercise, the FITT principles of exercise, normal and abnormal responses to exercise, and how to exercise safely.   Angina -Discuss definition of angina, causes of angina, treatment of angina, and how to decrease risk of having angina.   Cardiac Medications -Review what the following cardiac medications are used  for, how they affect the body, and side effects that may occur when taking the medications.  Medications include Aspirin, Beta blockers, calcium channel blockers, ACE Inhibitors, angiotensin receptor blockers, diuretics, digoxin, and antihyperlipidemics.   Congestive Heart Failure -Discuss the definition of  CHF, how to live with CHF, the signs and symptoms of CHF, and how keep track of weight and sodium intake.   Heart Disease and Intimacy -Discus the effect sexual activity has on the heart, how changes occur during intimacy as we age, and safety during sexual activity.   Smoking Cessation / COPD -Discuss different methods to quit smoking, the health benefits of quitting smoking, and the definition of COPD.   Nutrition I: Fats -Discuss the types of cholesterol, what cholesterol does to the heart, and how cholesterol levels can be controlled.   Nutrition II: Labels -Discuss the different components of food labels and how to read food label   Heart Parts and Heart Disease -Discuss the anatomy of the heart, the pathway of blood circulation through the heart, and these are affected by heart disease.   Stress I: Signs and Symptoms -Discuss the causes of stress, how stress may lead to anxiety and depression, and ways to limit stress.   Stress II: Relaxation -Discuss different types of relaxation techniques to limit stress.   Warning Signs of Stroke / TIA -Discuss definition of a stroke, what the signs and symptoms are of a stroke, and how to identify when someone is having stroke.   Knowledge Questionnaire Score:     Knowledge Questionnaire Score - 06/22/17 1523      Knowledge Questionnaire Score   Pre Score 23/24      Core Components/Risk Factors/Patient Goals at Admission:     Personal Goals and Risk Factors at Admission - 06/22/17 1527      Core Components/Risk Factors/Patient Goals on Admission    Weight Management Yes   Admit Weight 202 lb (91.6 kg)   Goal  Weight: Short Term 177 lb (80.3 kg)   Goal Weight: Long Term 152 lb (68.9 kg)   Expected Outcomes Short Term: Continue to assess and modify interventions until short term weight is achieved;Long Term: Adherence to nutrition and physical activity/exercise program aimed toward attainment of established weight goal   Tobacco Cessation Yes   Intervention Assist the participant in steps to quit. Provide individualized education and counseling about committing to Tobacco Cessation, relapse prevention, and pharmacological support that can be provided by physician.   Expected Outcomes Short Term: Will quit all tobacco product use, adhering to prevention of relapse plan.;Long Term: Complete abstinence from all tobacco products for at least 12 months from quit date.   Improve shortness of breath with ADL's Yes   Intervention Provide education, individualized exercise plan and daily activity instruction to help decrease symptoms of SOB with activities of daily living.   Expected Outcomes Short Term: Achieves a reduction of symptoms when performing activities of daily living.   Stress Yes   Intervention Offer individual and/or small group education and counseling on adjustment to heart disease, stress management and health-related lifestyle change. Teach and support self-help strategies.   Expected Outcomes Short Term: Participant demonstrates changes in health-related behavior, relaxation and other stress management skills, ability to obtain effective social support, and compliance with psychotropic medications if prescribed.   Personal Goal Other Yes   Personal Goal Breathe better, Lose 50 lbs   Intervention Attend program 3 x week and supplement 2 x week exercise at home   Expected Outcomes Meet personal goals      Core Components/Risk Factors/Patient Goals Review:    Core Components/Risk Factors/Patient Goals at Discharge (Final Review):    ITP Comments:   Comments: Patient arrived for 1st  visit/orientation/education at 1230. Patient was  referred to CR by Dr. [name] due to NSTEMI (I21.4) and Stent Placement (Z95.5). During orientation advised patient on arrival and appointment times what to wear, what to do before, during and after exercise. Reviewed attendance and class policy. Talked about inclement weather and class consultation policy. Pt is scheduled to return Cardiac Rehab on 07/06/17 at 0815. Pt was advised to come to class 15 minutes before class starts. Patient was also given instructions on meeting with the dietician and attending the Family Structure classes. Discussed RPE/Dpysnea scales. Discussed initial THR and how to find their radial and/or carotid pulse. Discussed the initial exercise prescription and how this effects their progress. Pt is eager to get started. Patient participated in warm up stretches followed by light weights and resistance bands. Patient was able to complete 6 minute walk test. Patient c/o pain in (L) hip at 8/10. Pain subsided to 0/10 at 2 minute rest. Pain was completely gone at the end of orientation. Patient was measured for the equipment. Discussed equipment safety with patient. Took patient pre-anthropometric measurements. Patient finished visit at 1500.

## 2017-06-22 NOTE — Progress Notes (Signed)
Daily Session Note  Patient Details  Name: Randy Hebert MRN: [id number] Date of Birth: 1966-04-28 Referring Provider:     CARDIAC REHAB PHASE II ORIENTATION from 06/22/2017 in [place]  Referring Provider  Dr. [name]      Encounter Date: 06/22/2017  Check In:     Session Check In - 06/22/17 1230      Check-In   Location [facility]   Staff Present Ayven Pheasant Angelina Pih, MS, EP, [facility], Exercise Physiologist;Gregory Luther Parody, BS, EP, Exercise Physiologist;Debra Wynetta Emery, RN, BSN   Supervising physician immediately available to respond to emergencies See telemetry face sheet for immediately available MD   Medication changes reported     No   Fall or balance concerns reported    Yes   Comments Fallen 3 times   Tobacco Cessation Use Decreased  Still smoking   Warm-up and Cool-down Performed as group-led instruction   Resistance Training Performed Yes   VAD Patient? No     Pain Assessment   Pain Score 8    Pain Location Hip   Pain Orientation Left   Pain Descriptors / Indicators Aching   Pain Onset More than a month ago   Pain Relieving Factors Walking   Multiple Pain Sites No      Capillary Blood Glucose: No results found for this or any previous visit (from the past 24 hour(s)).    History  Smoking Status  . Current Every Day Smoker  . Packs/day: 1.00  . Years: 46.00  . Types: Cigarettes  . Start date: 06/07/1979  Smokeless Tobacco  . Former Systems developer  . Types: Chew    Goals Met:  Independence with exercise equipment Improved SOB with ADL's Exercise tolerated well No report of cardiac concerns or symptoms Strength training completed today  Goals Unmet:  Not Applicable  Comments: 1561   Dr. Kate Sable is Medical Director for [facility] and [facility] Re[facility].

## 2017-06-22 NOTE — Telephone Encounter (Signed)
Received page from [facility] monitoring service, returned call. Notified that patient had sinus tachycardia followed by sinus bradycardia, then a 6.2 second pause. He also had a separate 3.8 second pause. When patient was contacted, he was asleep and asymptomatic.  Buford Dresser, MD, PhD, overnight cardiology provider

## 2017-06-22 NOTE — Progress Notes (Signed)
Cardiac/Pulmonary Rehab Medication Review by a Pharmacist  Does the patient  feel that his/her medications are working for him/her?  yes  Has the patient been experiencing any side effects to the medications prescribed?  no  Does the patient measure his/her own blood pressure or blood glucose at home?  yes   Does the patient have any problems obtaining medications due to transportation or finances?   no  Understanding of regimen: fair Understanding of indications: good Potential of compliance: good  Questions asked to Determine Patient Understanding of Medication Regimen:  1. What is the name of the medication?  2. What is the medication used for?  3. When should it be taken?  4. How much should be taken?  5. How will you take it?  6. What side effects should you report?  Understanding Defined as: Excellent: All questions above are correct Good: Questions 1-4 are correct Fair: Questions 1-2 are correct  Poor: 1 or none of the above questions are correct   Pharmacist comments: Pt does not c/o any side effects from medications however pt does c/o some anxiety.  Pt states that Celexa has been stopped but unclear why, may need to resume to control anxiety.  Will d/w psychiatrist.  Med list updated.  Pt is trying to quit smoking.    Hart Robinsons A 06/22/2017 2:15 PM

## 2017-06-25 ENCOUNTER — Encounter: Payer: Self-pay | Admitting: Internal Medicine

## 2017-06-25 ENCOUNTER — Encounter: Payer: Self-pay | Admitting: *Deleted

## 2017-06-25 ENCOUNTER — Ambulatory Visit (INDEPENDENT_AMBULATORY_CARE_PROVIDER_SITE_OTHER): Payer: Medicaid Other | Admitting: Internal Medicine

## 2017-06-25 VITALS — BP 148/90 | HR 90 | Ht 66.0 in | Wt 232.0 lb

## 2017-06-25 DIAGNOSIS — I2510 Atherosclerotic heart disease of native coronary artery without angina pectoris: Secondary | ICD-10-CM | POA: Diagnosis not present

## 2017-06-25 DIAGNOSIS — I5022 Chronic systolic (congestive) heart failure: Secondary | ICD-10-CM

## 2017-06-25 DIAGNOSIS — R55 Syncope and collapse: Secondary | ICD-10-CM | POA: Diagnosis not present

## 2017-06-25 LAB — CBC WITH DIFFERENTIAL/PLATELET
BASOS ABS: 49 {cells}/uL (ref 0–200)
Basophils Relative: 0.5 %
EOS PCT: 1.8 %
Eosinophils Absolute: 176 cells/uL (ref 15–500)
HCT: 43.1 % (ref 38.5–50.0)
HEMOGLOBIN: 13.9 g/dL (ref 13.2–17.1)
Lymphs Abs: 2038 cells/uL (ref 850–3900)
MCH: 24.2 pg — ABNORMAL LOW (ref 27.0–33.0)
MCHC: 32.3 g/dL (ref 32.0–36.0)
MCV: 75.1 fL — ABNORMAL LOW (ref 80.0–100.0)
MONOS PCT: 9.1 %
MPV: 9.1 fL (ref 7.5–12.5)
NEUTROS ABS: 6644 {cells}/uL (ref 1500–7800)
Neutrophils Relative %: 67.8 %
PLATELETS: 329 10*3/uL (ref 140–400)
RBC: 5.74 10*6/uL (ref 4.20–5.80)
RDW: 15.7 % — AB (ref 11.0–15.0)
TOTAL LYMPHOCYTE: 20.8 %
WBC mixed population: 892 cells/uL (ref 200–950)
WBC: 9.8 10*3/uL (ref 3.8–10.8)

## 2017-06-25 LAB — BASIC METABOLIC PANEL
BUN: 10 mg/dL (ref 7–25)
CALCIUM: 9.4 mg/dL (ref 8.6–10.3)
CHLORIDE: 96 mmol/L — AB (ref 98–110)
CO2: 33 mmol/L — AB (ref 20–32)
Creat: 0.78 mg/dL (ref 0.70–1.33)
GLUCOSE: 121 mg/dL (ref 65–139)
Potassium: 4.3 mmol/L (ref 3.5–5.3)
SODIUM: 136 mmol/L (ref 135–146)

## 2017-06-25 LAB — PROTIME-INR
INR: 1
PROTHROMBIN TIME: 10.9 s (ref 9.0–11.5)
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
## 2017-06-25 NOTE — Patient Instructions (Signed)
Medication Instructions:  Your physician recommends that you continue on your current medications as directed. Please refer to the Current Medication list given to you today.   Labwork: Your physician recommends that you return for lab work in: Today    Testing/Procedures: Your physician has recommended that you have an EP Study. This test is used to assess serious arrhythmias (irregular heartbeats). During an Electro-physiology Study (EPS), a thin, flexible wire is passed through a vein in your groin (upper thigh) or neck up to the heart. The wire records the heart's electrical signals. Your doctor uses the wire to electrically stimulate your heart and trigger an arrhythmic. This allows the doctor to see whether an antiarrhythmia medicine can help manage the problem or if further procedures are necessary (i.e., ablation/ICD). Radiofrequency ablation, a procedure used to fix some types of arrthythmia, may be done during an EPS. This is done in the hospital and often requires an overnight stay. Please see the instruction sheet given to your today for more information.    Follow-Up: Your physician recommends that you schedule a follow-up appointment in: Today    Any Other Special Instructions Will Be Listed Below (If Applicable). You have been given a letter.    If you need a refill on your cardiac medications before your next appointment, please call your pharmacy. Thank you for choosing [facility]!

## 2017-06-29 ENCOUNTER — Encounter (HOSPITAL_COMMUNITY)
Admission: RE | Disposition: A | Discharge status: Home / Self Care | Payer: Self-pay | Source: Ambulatory Visit | Attending: Internal Medicine

## 2017-06-29 ENCOUNTER — Ambulatory Visit (HOSPITAL_COMMUNITY)
Admission: RE | Admit: 2017-06-29 | Discharge: 2017-06-30 | Disposition: A | Discharge status: Home / Self Care | Payer: Medicaid Other | Source: Ambulatory Visit | Attending: Internal Medicine | Admitting: Internal Medicine

## 2017-06-29 ENCOUNTER — Encounter (HOSPITAL_COMMUNITY): Payer: Self-pay

## 2017-06-29 ENCOUNTER — Ambulatory Visit: Payer: Medicaid Other | Admitting: Adult Health

## 2017-06-29 DIAGNOSIS — I2510 Atherosclerotic heart disease of native coronary artery without angina pectoris: Secondary | ICD-10-CM | POA: Diagnosis not present

## 2017-06-29 DIAGNOSIS — E785 Hyperlipidemia, unspecified: Secondary | ICD-10-CM | POA: Insufficient documentation

## 2017-06-29 DIAGNOSIS — R55 Syncope and collapse: Secondary | ICD-10-CM | POA: Diagnosis present

## 2017-06-29 DIAGNOSIS — E119 Type 2 diabetes mellitus without complications: Secondary | ICD-10-CM | POA: Diagnosis not present

## 2017-06-29 DIAGNOSIS — I110 Hypertensive heart disease with heart failure: Secondary | ICD-10-CM | POA: Insufficient documentation

## 2017-06-29 DIAGNOSIS — Z7982 Long term (current) use of aspirin: Secondary | ICD-10-CM | POA: Diagnosis not present

## 2017-06-29 DIAGNOSIS — Z880 Allergy status to penicillin: Secondary | ICD-10-CM | POA: Diagnosis not present

## 2017-06-29 DIAGNOSIS — F419 Anxiety disorder, unspecified: Secondary | ICD-10-CM | POA: Diagnosis not present

## 2017-06-29 DIAGNOSIS — Z6837 Body mass index (BMI) 37.0-37.9, adult: Secondary | ICD-10-CM | POA: Diagnosis not present

## 2017-06-29 DIAGNOSIS — F17210 Nicotine dependence, cigarettes, uncomplicated: Secondary | ICD-10-CM | POA: Diagnosis not present

## 2017-06-29 DIAGNOSIS — Z7984 Long term (current) use of oral hypoglycemic drugs: Secondary | ICD-10-CM | POA: Insufficient documentation

## 2017-06-29 DIAGNOSIS — G4733 Obstructive sleep apnea (adult) (pediatric): Secondary | ICD-10-CM | POA: Diagnosis not present

## 2017-06-29 DIAGNOSIS — I252 Old myocardial infarction: Secondary | ICD-10-CM | POA: Insufficient documentation

## 2017-06-29 DIAGNOSIS — I509 Heart failure, unspecified: Secondary | ICD-10-CM | POA: Diagnosis not present

## 2017-06-29 DIAGNOSIS — F329 Major depressive disorder, single episode, unspecified: Secondary | ICD-10-CM | POA: Diagnosis not present

## 2017-06-29 DIAGNOSIS — I4510 Unspecified right bundle-branch block: Secondary | ICD-10-CM | POA: Diagnosis not present

## 2017-06-29 DIAGNOSIS — I255 Ischemic cardiomyopathy: Secondary | ICD-10-CM | POA: Diagnosis not present

## 2017-06-29 DIAGNOSIS — E669 Obesity, unspecified: Secondary | ICD-10-CM | POA: Diagnosis not present

## 2017-06-29 DIAGNOSIS — Z9981 Dependence on supplemental oxygen: Secondary | ICD-10-CM | POA: Insufficient documentation

## 2017-06-29 DIAGNOSIS — M1990 Unspecified osteoarthritis, unspecified site: Secondary | ICD-10-CM | POA: Insufficient documentation

## 2017-06-29 DIAGNOSIS — J449 Chronic obstructive pulmonary disease, unspecified: Secondary | ICD-10-CM | POA: Insufficient documentation

## 2017-06-29 DIAGNOSIS — K219 Gastro-esophageal reflux disease without esophagitis: Secondary | ICD-10-CM | POA: Insufficient documentation

## 2017-06-29 DIAGNOSIS — Z955 Presence of coronary angioplasty implant and graft: Secondary | ICD-10-CM | POA: Insufficient documentation

## 2017-06-29 HISTORY — PX: LOOP RECORDER INSERTION: SHX6722

## 2017-06-29 HISTORY — PX: ELECTROPHYSIOLOGY STUDY: EP1205

## 2017-06-29 HISTORY — DX: Syncope and collapse: R55

## 2017-06-29 LAB — GLUCOSE, CAPILLARY
GLUCOSE-CAPILLARY: 139 mg/dL — AB (ref 65–99)
GLUCOSE-CAPILLARY: 135 mg/dL — AB (ref 65–99)
GLUCOSE-CAPILLARY: 274 mg/dL — AB (ref 65–99)
Glucose-Capillary: 147 mg/dL — ABNORMAL HIGH (ref 65–99)

## 2017-06-29 LAB — SURGICAL PCR SCREEN
MRSA, PCR: NEGATIVE
Staphylococcus aureus: NEGATIVE

## 2017-06-29 SURGERY — ELECTROPHYSIOLOGY STUDY

## 2017-06-29 MED ORDER — ALBUTEROL SULFATE (2.5 MG/3ML) 0.083% IN NEBU: 2.5000 mg | INHALATION_SOLUTION | Freq: Four times a day (QID) | RESPIRATORY_TRACT | Status: DC | PRN

## 2017-06-29 MED ORDER — ACETAMINOPHEN 325 MG PO TABS: 650.0000 mg | ORAL_TABLET | Freq: Four times a day (QID) | ORAL | Status: DC | PRN

## 2017-06-29 MED ORDER — FENTANYL CITRATE (PF) 100 MCG/2ML IJ SOLN
INTRAMUSCULAR | Status: DC | PRN
  Administered 2017-06-29: 12.5 ug via INTRAVENOUS
  Administered 2017-06-29: 25 ug via INTRAVENOUS

## 2017-06-29 MED ORDER — MUPIROCIN 2 % EX OINT
1.0000 "application " | TOPICAL_OINTMENT | Freq: Once | CUTANEOUS | Status: DC
  Filled 2017-06-29: qty 22

## 2017-06-29 MED ORDER — MIDAZOLAM HCL 5 MG/5ML IJ SOLN
INTRAMUSCULAR | Status: AC
  Filled 2017-06-29: qty 5

## 2017-06-29 MED ORDER — PRASUGREL HCL 10 MG PO TABS
10.0000 mg | ORAL_TABLET | Freq: Every day | ORAL | Status: DC
  Administered 2017-06-29 – 2017-06-30 (×2): 10 mg via ORAL
  Filled 2017-06-29 (×2): qty 1

## 2017-06-29 MED ORDER — MOMETASONE FURO-FORMOTEROL FUM 100-5 MCG/ACT IN AERO
2.0000 | INHALATION_SPRAY | Freq: Two times a day (BID) | RESPIRATORY_TRACT | Status: DC
  Administered 2017-06-29 – 2017-06-30 (×2): 2 via RESPIRATORY_TRACT
  Filled 2017-06-29: qty 8.8

## 2017-06-29 MED ORDER — BUPIVACAINE HCL (PF) 0.25 % IJ SOLN
INTRAMUSCULAR | Status: DC | PRN
  Administered 2017-06-29 (×2): 30 mL

## 2017-06-29 MED ORDER — MAGNESIUM OXIDE 400 (241.3 MG) MG PO TABS
200.0000 mg | ORAL_TABLET | Freq: Every day | ORAL | Status: DC
  Administered 2017-06-29 – 2017-06-30 (×2): 200 mg via ORAL
  Filled 2017-06-29 (×3): qty 1

## 2017-06-29 MED ORDER — ALBUTEROL SULFATE (2.5 MG/3ML) 0.083% IN NEBU: 3.0000 mL | INHALATION_SOLUTION | Freq: Four times a day (QID) | RESPIRATORY_TRACT | Status: DC | PRN

## 2017-06-29 MED ORDER — SODIUM CHLORIDE 0.9% FLUSH
3.0000 mL | Freq: Two times a day (BID) | INTRAVENOUS | Status: DC
  Administered 2017-06-30: 3 mL via INTRAVENOUS

## 2017-06-29 MED ORDER — LIDOCAINE-EPINEPHRINE 1 %-1:100000 IJ SOLN
INTRAMUSCULAR | Status: AC
  Filled 2017-06-29: qty 1

## 2017-06-29 MED ORDER — SODIUM CHLORIDE 0.9% FLUSH: 3.0000 mL | INTRAVENOUS | Status: DC | PRN

## 2017-06-29 MED ORDER — METFORMIN HCL 500 MG PO TABS
1000.0000 mg | ORAL_TABLET | Freq: Two times a day (BID) | ORAL | Status: DC
  Administered 2017-06-29 – 2017-06-30 (×2): 1000 mg via ORAL
  Filled 2017-06-29 (×2): qty 2

## 2017-06-29 MED ORDER — NITROGLYCERIN 0.4 MG SL SUBL: 0.4000 mg | SUBLINGUAL_TABLET | SUBLINGUAL | Status: DC | PRN

## 2017-06-29 MED ORDER — HEPARIN (PORCINE) IN NACL 2-0.9 UNIT/ML-% IJ SOLN
INTRAMUSCULAR | Status: AC
  Filled 2017-06-29: qty 500

## 2017-06-29 MED ORDER — BUPIVACAINE HCL (PF) 0.25 % IJ SOLN
INTRAMUSCULAR | Status: AC
Start: 2017-06-29 — End: ?
  Filled 2017-06-29: qty 30

## 2017-06-29 MED ORDER — TRAMADOL HCL 50 MG PO TABS
50.0000 mg | ORAL_TABLET | Freq: Once | ORAL | Status: AC
  Administered 2017-06-29: 21:00:00 50 mg via ORAL
  Filled 2017-06-29: qty 1

## 2017-06-29 MED ORDER — FENTANYL CITRATE (PF) 100 MCG/2ML IJ SOLN
INTRAMUSCULAR | Status: AC
  Filled 2017-06-29: qty 2

## 2017-06-29 MED ORDER — VANCOMYCIN HCL 10 G IV SOLR
1500.0000 mg | INTRAVENOUS | Status: DC
  Filled 2017-06-29: qty 1500

## 2017-06-29 MED ORDER — CYCLOSPORINE 0.05 % OP EMUL
1.0000 [drp] | Freq: Two times a day (BID) | OPHTHALMIC | Status: DC
  Administered 2017-06-29 – 2017-06-30 (×2): 1 [drp] via OPHTHALMIC
  Filled 2017-06-29 (×2): qty 1

## 2017-06-29 MED ORDER — ONDANSETRON HCL 4 MG/2ML IJ SOLN: 4.0000 mg | Freq: Four times a day (QID) | INTRAMUSCULAR | Status: DC | PRN

## 2017-06-29 MED ORDER — MIDAZOLAM HCL 5 MG/5ML IJ SOLN
INTRAMUSCULAR | Status: DC | PRN
  Administered 2017-06-29: 2 mg via INTRAVENOUS
  Administered 2017-06-29: 1 mg via INTRAVENOUS

## 2017-06-29 MED ORDER — FUROSEMIDE 20 MG PO TABS
20.0000 mg | ORAL_TABLET | Freq: Every day | ORAL | Status: DC
  Administered 2017-06-29 – 2017-06-30 (×2): 20 mg via ORAL
  Filled 2017-06-29 (×2): qty 1

## 2017-06-29 MED ORDER — TRAZODONE HCL 50 MG PO TABS
100.0000 mg | ORAL_TABLET | Freq: Every day | ORAL | Status: DC
  Administered 2017-06-29: 100 mg via ORAL
  Filled 2017-06-29: qty 2

## 2017-06-29 MED ORDER — HEPARIN (PORCINE) IN NACL 2-0.9 UNIT/ML-% IJ SOLN
INTRAMUSCULAR | Status: AC | PRN
  Administered 2017-06-29: 500 mL

## 2017-06-29 MED ORDER — SODIUM CHLORIDE 0.9 % IV SOLN
INTRAVENOUS | Status: DC
  Administered 2017-06-29: 12:00:00 via INTRAVENOUS

## 2017-06-29 MED ORDER — SODIUM CHLORIDE 0.9 % IR SOLN
80.0000 mg | Status: DC
  Filled 2017-06-29: qty 2

## 2017-06-29 MED ORDER — ASPIRIN EC 81 MG PO TBEC
81.0000 mg | DELAYED_RELEASE_TABLET | Freq: Every day | ORAL | Status: DC
  Administered 2017-06-29 – 2017-06-30 (×2): 81 mg via ORAL
  Filled 2017-06-29 (×2): qty 1

## 2017-06-29 MED ORDER — MUPIROCIN 2 % EX OINT
TOPICAL_OINTMENT | CUTANEOUS | Status: AC
  Administered 2017-06-29: 1
  Filled 2017-06-29: qty 22

## 2017-06-29 MED ORDER — PANTOPRAZOLE SODIUM 40 MG PO TBEC
40.0000 mg | DELAYED_RELEASE_TABLET | Freq: Every day | ORAL | Status: DC
  Administered 2017-06-29 – 2017-06-30 (×2): 40 mg via ORAL
  Filled 2017-06-29 (×2): qty 1

## 2017-06-29 MED ORDER — CHLORHEXIDINE GLUCONATE 4 % EX LIQD
60.0000 mL | Freq: Once | CUTANEOUS | Status: DC
  Filled 2017-06-29: qty 60

## 2017-06-29 MED ORDER — NITROGLYCERIN 0.4 MG SL SUBL
SUBLINGUAL_TABLET | SUBLINGUAL | Status: AC
  Administered 2017-06-29: 0.4 mg
  Filled 2017-06-29: qty 1

## 2017-06-29 MED ORDER — ATORVASTATIN CALCIUM 80 MG PO TABS
80.0000 mg | ORAL_TABLET | Freq: Every day | ORAL | Status: DC
  Administered 2017-06-29 – 2017-06-30 (×2): 80 mg via ORAL
  Filled 2017-06-29 (×2): qty 1

## 2017-06-29 MED ORDER — POLYVINYL ALCOHOL 1.4 % OP SOLN: 1.0000 [drp] | Freq: Two times a day (BID) | OPHTHALMIC | Status: DC | PRN

## 2017-06-29 MED ORDER — LIDOCAINE-EPINEPHRINE 1 %-1:100000 IJ SOLN
INTRAMUSCULAR | Status: DC | PRN
  Administered 2017-06-29: 30 mL

## 2017-06-29 MED ORDER — LORAZEPAM 0.5 MG PO TABS
0.5000 mg | ORAL_TABLET | Freq: Two times a day (BID) | ORAL | Status: DC | PRN
  Administered 2017-06-29 – 2017-06-30 (×2): 0.5 mg via ORAL
  Filled 2017-06-29 (×2): qty 1

## 2017-06-29 MED ORDER — ACETAMINOPHEN 325 MG PO TABS
650.0000 mg | ORAL_TABLET | ORAL | Status: DC | PRN
  Administered 2017-06-29: 19:00:00 650 mg via ORAL
  Filled 2017-06-29: qty 2

## 2017-06-29 MED ORDER — SODIUM CHLORIDE 0.9 % IV SOLN: 250.0000 mL | INTRAVENOUS | Status: DC | PRN

## 2017-06-29 SURGICAL SUPPLY — 8 items
CATH JOSEPHSON QUAD-ALLRED 6FR (CATHETERS) ×4 IMPLANT
HOVERMATT SINGLE USE (MISCELLANEOUS) ×2 IMPLANT
LOOP REVEAL LINQSYS (Prosthesis & Implant Heart) ×2 IMPLANT
PACK EP LATEX FREE (CUSTOM PROCEDURE TRAY) ×4
PACK EP LF (CUSTOM PROCEDURE TRAY) IMPLANT
PACK LOOP INSERTION (CUSTOM PROCEDURE TRAY) ×2 IMPLANT
PAD DEFIB LIFELINK (PAD) ×2 IMPLANT
SHEATH PINNACLE 6F 10CM (SHEATH) ×4 IMPLANT

## 2017-06-29 NOTE — Progress Notes (Signed)
Patient ID: Randy Hebert, male   DOB: 1966/07/23, 51 y.o.   MRN: [id number]  EP Attending  Patient underwent invasive EP study earlier today and had no inducible VT or SVT. The patient notes that he has had worsening sob and feels like he did before his prior PCI. I will plan to obtain a stress myoview in the a.m.  Mikle Bosworth.D.

## 2017-06-29 NOTE — Progress Notes (Signed)
Cardiac Individual Treatment Plan  Patient Details  Name: Randy Hebert MRN: [id number] Date of Birth: 07/25/1966 Referring Provider:     [place] from 06/22/2017 in [place]  Referring Provider  Dr. [name]      Initial Encounter Date:    CARDIAC REHAB PHASE II ORIENTATION from 06/22/2017 in [place]  Date  06/22/17  Referring Provider  Dr. [name]      Visit Diagnosis: NSTEMI (non-ST elevated myocardial infarction) [facility])  Status post coronary artery stent placement  Patient's Home Medications on Admission:  Current Outpatient Prescriptions:  .  acetaminophen (TYLENOL) 325 MG tablet, Take 650 mg by mouth every 6 (six) hours as needed for mild pain., Disp: , Rfl:  .  albuterol (PROVENTIL HFA;VENTOLIN HFA) 108 (90 Base) MCG/ACT inhaler, Inhale 2 puffs into the lungs every 6 (six) hours as needed for wheezing or shortness of breath., Disp: 1 Inhaler, Rfl: 4 .  albuterol (PROVENTIL) (2.5 MG/3ML) 0.083% nebulizer solution, Take 3 mLs (2.5 mg total) by nebulization every 6 (six) hours as needed for wheezing or shortness of breath., Disp: 150 mL, Rfl: 1 .  aspirin 81 MG EC tablet, Take 1 tablet (81 mg total) by mouth daily., Disp: 30 tablet, Rfl:  .  atorvastatin (LIPITOR) 80 MG tablet, Take 80 mg by mouth daily., Disp: , Rfl:  .  Carboxymeth-Glycerin-Polysorb (REFRESH OPTIVE ADVANCED) 0.5-1-0.5 % SOLN, Apply 1 drop to eye 2 (two) times daily as needed (dry eyes). , Disp: , Rfl:  .  cycloSPORINE (RESTASIS) 0.05 % ophthalmic emulsion, Place 1 drop into both eyes 2 (two) times daily., Disp: , Rfl:  .  furosemide (LASIX) 20 MG tablet, Take 1 tablet (20 mg total) by mouth daily., Disp: 30 tablet, Rfl: 3 .  LORazepam (ATIVAN) 0.5 MG tablet, Take 1 tablet (0.5 mg total) by mouth 2 (two) times daily as needed for anxiety., Disp: 30 tablet, Rfl: 0 .  Magnesium 200 MG TABS, Take 1 tablet (200 mg total) by mouth daily.,  Disp: 30 each, Rfl: 6 .  metFORMIN (GLUCOPHAGE) 1000 MG tablet, Take 1 tablet (1,000 mg total) by mouth 2 (two) times daily with a meal., Disp: 180 tablet, Rfl: 3 .  mometasone-formoterol (DULERA) 100-5 MCG/ACT AERO, Inhale 2 puffs into the lungs 2 (two) times daily., Disp: 3 Inhaler, Rfl: 3 .  nicotine (NICODERM CQ - DOSED IN MG/24 HOURS) 21 mg/24hr patch, Place 1 patch (21 mg total) onto the skin daily. (Patient taking differently: Place 21 mg onto the skin daily. ), Disp: 28 patch, Rfl: 0 .  nitroGLYCERIN (NITROSTAT) 0.4 MG SL tablet, Place 1 tablet (0.4 mg total) under the tongue every 5 (five) minutes as needed for chest pain., Disp: 25 tablet, Rfl: 3 .  Omega-3 Fatty Acids (FISH OIL) 1200 MG CAPS, Take 1,200 mg by mouth 2 (two) times daily., Disp: , Rfl:  .  pantoprazole (PROTONIX) 40 MG tablet, Take 1 tablet (40 mg total) by mouth daily., Disp: 30 tablet, Rfl: 11 .  prasugrel (EFFIENT) 10 MG TABS tablet, Take 1 tablet (10 mg total) by mouth daily., Disp: 90 tablet, Rfl: 1 .  traZODone (DESYREL) 100 MG tablet, Take 100 mg by mouth at bedtime. , Disp: , Rfl:   Past Medical History: Past Medical History:  Diagnosis Date  . Allergy   . Anxiety   . Arthritis   . Asthma    hip replacement  . Back pain   . Bulging of cervical  intervertebral disc   . CAD (coronary artery disease)    lateral STEMI 02/06/2015 00% D1 occlusion treated with Promus Premier 2.5 mm x 16 mm DES, 70% ramus stenosis, 40% mid RCA stenosis, 45% distal RCA stenosis, EF 45-50%  . CHF (congestive heart failure) ([place])   . COPD (chronic obstructive pulmonary disease) ([place])   . Depression   . Diabetes mellitus without complication ([place])   . 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 Dry eye   . Dyspnea   . Early satiety 09/23/2016  . GERD (gastroesophageal reflux disease)   . Headache   . Heart murmur   . Hip pain   . Hyperlipidemia   . Hypertension   . MI (myocardial infarction)  ([place])   . Myocardial infarction ([place])   . Neck pain   . Otitis media   . Pleurisy   . Sinus pause    9 sec sinus pause on telemetry after started on coreg after MI, avoid AV nodal blocking agent  . Sleep apnea   . 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 Unilateral primary osteoarthritis, right hip 07/01/2016    Tobacco Use: History  Smoking Status  . Current Every Day Smoker  . Packs/day: 1.00  . Years: 46.00  . Types: Cigarettes  . Start date: 06/07/1979  Smokeless Tobacco  . Former Systems developer  . Types: Chew    Labs: Recent Review Flowsheet Data    Labs for ITP Cardiac and Pulmonary Rehab Latest Ref Rng & Units 05/20/2016 08/05/2016 11/11/2016 04/15/2017 04/27/2017   Cholestrol 0 - 200 mg/dL 128 132 112 134 93   LDLCALC 0 - 99 mg/dL 60 56 49 71 34   HDL >40 mg/dL 24(L) 22(L) 20(L) 24(L) 18(L)   Trlycerides <150 mg/dL 222(H) 272(H) 214(H) 193(H) 206(H)   Hemoglobin A1c <5.7 % 8.0(H) 6.3(H) 6.8(H) 7.6(H) -   PHART 7.350 - 7.450 - - - - -   PCO2ART 35.0 - 45.0 mmHg - - - - -   HCO3 20.0 - 24.0 mEq/L - - - - -   TCO2 0 - 100 mmol/L - - - - -   O2SAT % - - - - -      Capillary Blood Glucose: Lab Results  Component Value Date   GLUCAP 167 (H) 05/18/2017   GLUCAP 205 (H) 05/18/2017   GLUCAP 269 (H) 05/17/2017   GLUCAP 137 (H) 05/17/2017   GLUCAP 205 (H) 05/17/2017     Exercise Target Goals: Date: 06/22/17  Exercise Program Goal: Individual exercise prescription set with THRR, safety & activity barriers. Participant demonstrates ability to understand and report RPE using BORG scale, to self-measure pulse accurately, and to acknowledge the importance of the exercise prescription.  Exercise Prescription Goal: Starting with aerobic activity 30 plus minutes a day, 3 days per week for initial exercise prescription. Provide home exercise prescription and guidelines that participant acknowledges understanding prior to discharge.  Activity Barriers & Risk Stratification:      Activity Barriers & Cardiac Risk Stratification - 06/22/17 1340      Activity Barriers & Cardiac Risk Stratification   Activity Barriers Arthritis;Back Problems;Right Hip Replacement;Joint Problems;Deconditioning;Muscular Weakness;Shortness of Breath;Balance Concerns   Cardiac Risk Stratification High      6 Minute Walk:     6 Minute Walk    Row Name 06/22/17 1340         6 Minute Walk   Phase Initial  Distance 1000 feet     Distance % Change 0 %     Distance Feet Change 0 ft     Walk Time 6 minutes     # of Rest Breaks 0     MPH 1.89     METS 1.59     RPE 10     Perceived Dyspnea  13     VO2 Peak 11.3     Symptoms No     Resting HR 77 bpm     Resting BP 124/88     Resting Oxygen Saturation  95 %     Exercise Oxygen Saturation  during 6 min walk 91 %     Max Ex. HR 103 bpm     Max Ex. BP 184/100     2 Minute Post BP 126/84        Oxygen Initial Assessment:   Oxygen Re-Evaluation:   Oxygen Discharge (Final Oxygen Re-Evaluation):   Initial Exercise Prescription:     Initial Exercise Prescription - 06/22/17 1300      Date of Initial Exercise RX and Referring Provider   Date 06/22/17   Referring Provider Dr. [name]     NuStep   Level 2   SPM 50   Minutes 20   METs 1.7     Arm Ergometer   Level 1.5   Watts 12   RPM 12   Minutes 15   METs 1.9     Prescription Details   Frequency (times per week) 3   Duration Progress to 30 minutes of continuous aerobic without signs/symptoms of physical distress     Intensity   THRR 40-80% of Max Heartrate 113-132-150   Ratings of Perceived Exertion 11-13   Perceived Dyspnea 0-4     Progression   Progression Continue progressive overload as per policy without signs/symptoms or physical distress.     Resistance Training   Training Prescription Yes   Weight 1   Reps 10-15      Perform Capillary Blood Glucose checks as needed.  Exercise Prescription Changes:   Exercise Comments:       Exercise Comments    Row Name 06/25/17 1529           Exercise Comments Patient has not yet started CR and will be progressed in time.           Exercise Goals and Review:      Exercise Goals    Row Name 06/22/17 1342             Exercise Goals   Increase Physical Activity Yes       Intervention Provide advice, education, support and counseling about physical activity/exercise needs.;Develop an individualized exercise prescription for aerobic and resistive training based on initial evaluation findings, risk stratification, comorbidities and participant's personal goals.       Expected Outcomes Achievement of increased cardiorespiratory fitness and enhanced flexibility, muscular endurance and strength shown through measurements of functional capacity and personal statement of participant.       Increase Strength and Stamina Yes       Intervention Provide advice, education, support and counseling about physical activity/exercise needs.;Develop an individualized exercise prescription for aerobic and resistive training based on initial evaluation findings, risk stratification, comorbidities and participant's personal goals.       Expected Outcomes Achievement of increased cardiorespiratory fitness and enhanced flexibility, muscular endurance and strength shown through measurements of functional capacity and personal statement of participant.  Able to understand and use rate of perceived exertion (RPE) scale Yes       Intervention Provide education and explanation on how to use RPE scale       Expected Outcomes Short Term: Able to use RPE daily in rehab to express subjective intensity level;Long Term:  Able to use RPE to guide intensity level when exercising independently       Able to understand and use Dyspnea scale Yes       Intervention Provide education and explanation on how to use Dyspnea scale       Expected Outcomes Short Term: Able to use Dyspnea scale daily in rehab to  express subjective sense of shortness of breath during exertion;Long Term: Able to use Dyspnea scale to guide intensity level when exercising independently       Knowledge and understanding of Target Heart Rate Range (THRR) Yes       Intervention Provide education and explanation of THRR including how the numbers were predicted and where they are located for reference       Expected Outcomes Short Term: Able to state/look up THRR       Able to check pulse independently Yes       Intervention Provide education and demonstration on how to check pulse in carotid and radial arteries.;Review the importance of being able to check your own pulse for safety during independent exercise       Expected Outcomes Short Term: Able to explain why pulse checking is important during independent exercise;Long Term: Able to check pulse independently and accurately       Understanding of Exercise Prescription Yes       Intervention Provide education, explanation, and written materials on patient's individual exercise prescription       Expected Outcomes Short Term: Able to explain program exercise prescription;Long Term: Able to explain home exercise prescription to exercise independently          Exercise Goals Re-Evaluation :    Discharge Exercise Prescription (Final Exercise Prescription Changes):   Nutrition:  Target Goals: Understanding of nutrition guidelines, daily intake of sodium 1500mg , cholesterol 200mg , calories 30% from fat and 7% or less from saturated fats, daily to have 5 or more servings of fruits and vegetables.  Biometrics:     Pre Biometrics - 06/22/17 1343      Pre Biometrics   Height 5\' 6"  (1.676 m)   Weight 230 lb 9.6 oz (104.6 kg)   Waist Circumference 50 inches   Hip Circumference 43 inches   Waist to Hip Ratio 1.16 %   BMI (Calculated) 37.24   Triceps Skinfold 6 mm   % Body Fat 32 %   Grip Strength 62.33 kg   Flexibility 0 in   Single Leg Stand 15 seconds        Nutrition Therapy Plan and Nutrition Goals:     Nutrition Therapy & Goals - 06/22/17 1526      Personal Nutrition Goals   Personal Goal #2 Still eating heart healthy, diabetic diet, low sodium   Additional Goals? No      Nutrition Discharge: Rate Your Plate Scores:     Nutrition Assessments - 06/22/17 1526      MEDFICTS Scores   Pre Score 64      Nutrition Goals Re-Evaluation:   Nutrition Goals Discharge (Final Nutrition Goals Re-Evaluation):   Psychosocial: Target Goals: Acknowledge presence or absence of significant depression and/or stress, maximize coping skills, provide positive support system. Participant is  able to verbalize types and ability to use techniques and skills needed for reducing stress and depression.  Initial Review & Psychosocial Screening:     Initial Psych Review & Screening - 06/22/17 1530      Initial Review   Current issues with Current Depression;Current Anxiety/Panic     Family Dynamics   Good Support System? Yes     Barriers   Psychosocial barriers to participate in program There are no identifiable barriers or psychosocial needs.;The patient should benefit from training in stress management and relaxation.     Screening Interventions   Interventions Encouraged to exercise;Provide feedback about the scores to participant      Quality of Life Scores:     Quality of Life - 06/22/17 1344      Quality of Life Scores   Health/Function Pre 9 %   Socioeconomic Pre 13 %   Psych/Spiritual Pre 17 %   Family Pre 16.8 %   GLOBAL Pre 12.64 %      PHQ-9: Recent Review Flowsheet Data    Depression screen [facility] 2/9 06/22/2017 06/17/2017 04/17/2017 04/15/2017   Decreased Interest 2 0 0 0   Down, Depressed, Hopeless 2 0 0 0   PHQ - 2 Score 4 0 0 0   Altered sleeping 2 - - -   Tired, decreased energy 2 - - -   Change in appetite 3 - - -   Feeling bad or failure about yourself  3 - - -   Trouble concentrating 1 - - -   Moving  slowly or fidgety/restless 0 - - -   Suicidal thoughts 0 - - -   PHQ-9 Score 15 - - -   Difficult doing work/chores Extremely dIfficult - - -     Interpretation of Total Score  Total Score Depression Severity:  1-4 = Minimal depression, 5-9 = Mild depression, 10-14 = Moderate depression, 15-19 = Moderately severe depression, 20-27 = Severe depression   Psychosocial Evaluation and Intervention:     Psychosocial Evaluation - 06/22/17 1531      Psychosocial Evaluation & Interventions   Interventions Relaxation education;Encouraged to exercise with the program and follow exercise prescription   Continue Psychosocial Services  Follow up required by staff      Psychosocial Re-Evaluation:   Psychosocial Discharge (Final Psychosocial Re-Evaluation):   Vocational Rehabilitation: Provide vocational rehab assistance to qualifying candidates.   Vocational Rehab Evaluation & Intervention:     Vocational Rehab - 06/22/17 1524      Initial Vocational Rehab Evaluation & Intervention   Assessment shows need for Vocational Rehabilitation No      Education: Education Goals: Education classes will be provided on a weekly basis, covering required topics. Participant will state understanding/return demonstration of topics presented.  Learning Barriers/Preferences:     Learning Barriers/Preferences - 06/22/17 1520      Learning Barriers/Preferences   Learning Barriers None   Learning Preferences Skilled Demonstration;Individual Instruction;Group Instruction;Video;Written Material;Pictoral      Education Topics: Hypertension, Hypertension Reduction -Define heart disease and high blood pressure. Discus how high blood pressure affects the body and ways to reduce high blood pressure.   Exercise and Your Heart -Discuss why it is important to exercise, the FITT principles of exercise, normal and abnormal responses to exercise, and how to exercise safely.   Angina -Discuss  definition of angina, causes of angina, treatment of angina, and how to decrease risk of having angina.   Cardiac Medications -Review what the following cardiac medications  are used for, how they affect the body, and side effects that may occur when taking the medications.  Medications include Aspirin, Beta blockers, calcium channel blockers, ACE Inhibitors, angiotensin receptor blockers, diuretics, digoxin, and antihyperlipidemics.   Congestive Heart Failure -Discuss the definition of CHF, how to live with CHF, the signs and symptoms of CHF, and how keep track of weight and sodium intake.   Heart Disease and Intimacy -Discus the effect sexual activity has on the heart, how changes occur during intimacy as we age, and safety during sexual activity.   Smoking Cessation / COPD -Discuss different methods to quit smoking, the health benefits of quitting smoking, and the definition of COPD.   Nutrition I: Fats -Discuss the types of cholesterol, what cholesterol does to the heart, and how cholesterol levels can be controlled.   Nutrition II: Labels -Discuss the different components of food labels and how to read food label   Heart Parts and Heart Disease -Discuss the anatomy of the heart, the pathway of blood circulation through the heart, and these are affected by heart disease.   Stress I: Signs and Symptoms -Discuss the causes of stress, how stress may lead to anxiety and depression, and ways to limit stress.   Stress II: Relaxation -Discuss different types of relaxation techniques to limit stress.   Warning Signs of Stroke / TIA -Discuss definition of a stroke, what the signs and symptoms are of a stroke, and how to identify when someone is having stroke.   Knowledge Questionnaire Score:     Knowledge Questionnaire Score - 06/22/17 1523      Knowledge Questionnaire Score   Pre Score 23/24      Core Components/Risk Factors/Patient Goals at Admission:     Personal  Goals and Risk Factors at Admission - 06/22/17 1527      Core Components/Risk Factors/Patient Goals on Admission    Weight Management Yes   Admit Weight 202 lb (91.6 kg)   Goal Weight: Short Term 177 lb (80.3 kg)   Goal Weight: Long Term 152 lb (68.9 kg)   Expected Outcomes Short Term: Continue to assess and modify interventions until short term weight is achieved;Long Term: Adherence to nutrition and physical activity/exercise program aimed toward attainment of established weight goal   Tobacco Cessation Yes   Intervention Assist the participant in steps to quit. Provide individualized education and counseling about committing to Tobacco Cessation, relapse prevention, and pharmacological support that can be provided by physician.   Expected Outcomes Short Term: Will quit all tobacco product use, adhering to prevention of relapse plan.;Long Term: Complete abstinence from all tobacco products for at least 12 months from quit date.   Improve shortness of breath with ADL's Yes   Intervention Provide education, individualized exercise plan and daily activity instruction to help decrease symptoms of SOB with activities of daily living.   Expected Outcomes Short Term: Achieves a reduction of symptoms when performing activities of daily living.   Stress Yes   Intervention Offer individual and/or small group education and counseling on adjustment to heart disease, stress management and health-related lifestyle change. Teach and support self-help strategies.   Expected Outcomes Short Term: Participant demonstrates changes in health-related behavior, relaxation and other stress management skills, ability to obtain effective social support, and compliance with psychotropic medications if prescribed.   Personal Goal Other Yes   Personal Goal Breathe better, Lose 50 lbs   Intervention Attend program 3 x week and supplement 2 x week exercise at home  Expected Outcomes Meet personal goals      Core  Components/Risk Factors/Patient Goals Review:    Core Components/Risk Factors/Patient Goals at Discharge (Final Review):    ITP Comments:     ITP Comments    Row Name 06/29/17 0755           ITP Comments Patient new to program. He is currently being evaluated for a syncope episode. Plans to start 07/06/17. Will continue to monitor.           Comments: ITP 30 Day  Patient new to program. He is currently being evaluated for a syncope episode. Plans to start 07/06/17. Will continue to monitor.

## 2017-06-29 NOTE — Progress Notes (Signed)
Pt c/o chest pain. Left upper chest "cramp".  EKG done, nitro given,pt on 2-L O2 [place]. Dr Lovena Le notified will continue to monitor

## 2017-06-29 NOTE — Interval H&P Note (Signed)
History and Physical Interval Note:  06/29/2017 3:44 PM  Randy Hebert  has presented today for surgery, with the diagnosis of syncope; ischemic cardiomyopathy  The various methods of treatment have been discussed with the patient and family. After consideration of risks, benefits and other options for treatment, the patient has consented to  Procedure(s): ELECTROPHYSIOLOGY STUDY (N/A) as a surgical intervention .  The patient's history has been reviewed, patient examined, no change in status, stable for surgery.  I have reviewed the patient's chart and labs.  Questions were answered to the patient's satisfaction.     Cristopher Peru

## 2017-06-29 NOTE — Progress Notes (Signed)
Two right femoral venous sheaths pulled by Eddie Dibbles, RN. Manual pressure held for 15 mins. Right PT palpable. Gauze and tegaderm dressing applied to right femoral site. Instructions reviewed with patient. No complications. Bedrest starts at 1725.
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
## 2017-06-29 NOTE — Progress Notes (Signed)
RT set up CPAP and placed on patient. Patient tolerating well at this time. RT will monitor as needed.  

## 2017-06-30 ENCOUNTER — Other Ambulatory Visit: Payer: Self-pay | Admitting: Physician Assistant

## 2017-06-30 ENCOUNTER — Encounter (HOSPITAL_COMMUNITY): Payer: Self-pay | Admitting: Internal Medicine

## 2017-06-30 DIAGNOSIS — R0602 Shortness of breath: Secondary | ICD-10-CM

## 2017-06-30 DIAGNOSIS — R55 Syncope and collapse: Secondary | ICD-10-CM | POA: Diagnosis not present

## 2017-06-30 LAB — GLUCOSE, CAPILLARY: Glucose-Capillary: 138 mg/dL — ABNORMAL HIGH (ref 65–99)

## 2017-06-30 MED ORDER — HEPARIN SOD (PORK) LOCK FLUSH 100 UNIT/ML IV SOLN
INTRAVENOUS | Status: AC
  Filled 2017-06-30: qty 5

## 2017-06-30 MED ORDER — FUROSEMIDE 40 MG PO TABS: 40.0000 mg | ORAL_TABLET | Freq: Every day | ORAL | 6 refills | Status: DC

## 2017-06-30 NOTE — Progress Notes (Signed)
Inpatient Diabetes Program Recommendations  AACE/ADA: New Consensus Statement on Inpatient Glycemic Control (2015)  Target Ranges:  Prepandial:   less than 140 mg/dL      Peak postprandial:   less than 180 mg/dL (1-2 hours)      Critically ill patients:  140 - 180 mg/dL   Lab Results  Component Value Date   GLUCAP 138 (H) 06/30/2017   HGBA1C 7.6 (H) 04/15/2017    Review of Glycemic Control Results for Randy Hebert, Randy Hebert (MRN [id number]) as of 06/30/2017 10:08  Ref. Range 06/29/2017 10:49 06/29/2017 17:09 06/29/2017 18:02 06/29/2017 21:19 06/30/2017 06:23  Glucose-Capillary Latest Ref Range: 65 - 99 mg/dL 139 (H) 147 (H) 135 (H) 274 (H) 138 (H)   Diabetes history: DM2 Outpatient Diabetes medications: Metformin 1 gm bid Current orders for Inpatient glycemic control: Metformin 1 gm bid  Inpatient Diabetes Program Recommendations:    -Novolog sensitive correction tid + 0-5 units hs Will follow.  Thank you, Nani Gasser. Eulla Kochanowski, RN, MSN, CDE  Diabetes Coordinator Inpatient Glycemic Control Team Team Pager [phone] (8am-5pm) 06/30/2017 10:11 AM

## 2017-06-30 NOTE — Care Management Note (Signed)
Case Management Note  Patient Details  Name: Randy Hebert MRN: [id number] Date of Birth: 12/08/1965  Subjective/Objective:  From home with girlfriend, pta indep, he is s/p EP study, for a myoview .  He was already taking effient prior to this admission.                    Action/Plan: NCM will follow for dc needs.   Expected Discharge Date:                  Expected Discharge Plan:  Home/Self Care  In-House Referral:     Discharge planning Services  CM Consult  Post Acute Care Choice:    Choice offered to:     DME Arranged:    DME Agency:     HH Arranged:    [place] Agency:     Status of Service:  Completed, signed off  If discussed at [employer] of Stay Meetings, dates discussed:    Additional Comments:  Zenon Mayo, RN 06/30/2017, 10:24 AM
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
## 2017-06-30 NOTE — Discharge Instructions (Signed)
No driving for 6 months  Monitor implant site care: Keep incision clean and dry for 3 days. You can remove outer dressing tomorrow. Leave steri-strips (little pieces of tape) on until seen in the office for wound check appointment. Call the office [phone]) for redness, drainage, swelling, or fever.   You are scheduled for a stress test at [facility] on 07/03/17 Nothing to eat/drink after midnight the evening prior You need to arrive to [facility] by 7:15 AM to the radiology department

## 2017-07-01 ENCOUNTER — Telehealth: Payer: Self-pay | Admitting: Family Medicine

## 2017-07-01 ENCOUNTER — Other Ambulatory Visit: Payer: Self-pay | Admitting: Family Medicine

## 2017-07-01 ENCOUNTER — Telehealth: Payer: Self-pay | Admitting: *Deleted

## 2017-07-01 MED ORDER — POTASSIUM CHLORIDE CRYS ER 20 MEQ PO TBCR: 20.0000 meq | EXTENDED_RELEASE_TABLET | Freq: Every day | ORAL | 3 refills | Status: DC

## 2017-07-01 NOTE — Telephone Encounter (Signed)
done

## 2017-07-01 NOTE — Telephone Encounter (Signed)
Patient called stating [facility] told him to get Dr Meda Coffee to sent a prescription over for a CPAP machine and he can get reimbursed for the one he is currently renting from them. Please advise [phone]  Patient request a cpap that has a humidifier if medicaid will pay for it.

## 2017-07-01 NOTE — Telephone Encounter (Signed)
Call him back please.  I sent a Rx to his pharmacy.

## 2017-07-01 NOTE — Telephone Encounter (Signed)
I called patient and made him aware.

## 2017-07-01 NOTE — Telephone Encounter (Signed)
Patient states dr.greg taylor increased the mg on his fluid pill dosage , pharmacist recommended a potassium pill for cramps due to the change. Please advise [phone]

## 2017-07-02 ENCOUNTER — Telehealth: Payer: Self-pay | Admitting: *Deleted

## 2017-07-02 ENCOUNTER — Telehealth: Payer: Self-pay | Admitting: Family Medicine

## 2017-07-02 MED ORDER — POTASSIUM CHLORIDE CRYS ER 20 MEQ PO TBCR: 20.0000 meq | EXTENDED_RELEASE_TABLET | Freq: Every day | ORAL | 3 refills | Status: DC

## 2017-07-02 NOTE — Telephone Encounter (Signed)
Danae Chen, [place], left message stating she needs last face-to-face encounter notes faxed for CPAP

## 2017-07-02 NOTE — Telephone Encounter (Signed)
Spoke with patient regarding symptoms associated with 3 second pause on Oct. 29th at 22:05. Patient states he usually goes to sleep around 2100, No symptoms.  Patient states he has had some flutter feeling in his chest associated with some dizziness throughout the day. Advised patient no other episodes have been noted. Educated patient on using symptom activator and to call us with any symptoms of syncope or presyncope. Patient verbalized understanding.   Reminded patient of Wound check appointment 07/13/17 at 12:00. Patient was appreciative.

## 2017-07-02 NOTE — Telephone Encounter (Signed)
Patient states he needs potassium chloride SA (K-DUR,KLOR-CON) 20 MEQ tablet [[id number]]  Sent to [facility].  He no longer uses [facility]

## 2017-07-02 NOTE — Telephone Encounter (Signed)
LVMOM regarding symptoms associated with 3 second pause on Oct. 29, 2018 at 22:05. [facility] phone number to call back.

## 2017-07-03 ENCOUNTER — Encounter (HOSPITAL_COMMUNITY)
Admission: RE | Admit: 2017-07-03 | Discharge: 2017-07-03 | Disposition: A | Discharge status: Home / Self Care | Payer: Medicaid Other | Source: Ambulatory Visit | Attending: Physician Assistant | Admitting: Physician Assistant

## 2017-07-03 ENCOUNTER — Ambulatory Visit (INDEPENDENT_AMBULATORY_CARE_PROVIDER_SITE_OTHER): Payer: Medicaid Other | Admitting: Adult Health

## 2017-07-03 ENCOUNTER — Other Ambulatory Visit: Payer: Self-pay | Admitting: Family Medicine

## 2017-07-03 ENCOUNTER — Encounter: Payer: Self-pay | Admitting: Adult Health

## 2017-07-03 ENCOUNTER — Encounter (HOSPITAL_BASED_OUTPATIENT_CLINIC_OR_DEPARTMENT_OTHER)
Admit: 2017-07-03 | Discharge: 2017-07-03 | Disposition: A | Discharge status: Home / Self Care | Payer: Medicaid Other | Attending: Physician Assistant | Admitting: Physician Assistant

## 2017-07-03 ENCOUNTER — Telehealth: Payer: Self-pay | Admitting: *Deleted

## 2017-07-03 ENCOUNTER — Encounter (HOSPITAL_COMMUNITY): Payer: Self-pay

## 2017-07-03 VITALS — BP 136/66 | HR 100 | Ht 65.0 in | Wt 231.0 lb

## 2017-07-03 DIAGNOSIS — I2583 Coronary atherosclerosis due to lipid rich plaque: Secondary | ICD-10-CM

## 2017-07-03 DIAGNOSIS — R0602 Shortness of breath: Secondary | ICD-10-CM | POA: Insufficient documentation

## 2017-07-03 DIAGNOSIS — R55 Syncope and collapse: Secondary | ICD-10-CM

## 2017-07-03 DIAGNOSIS — I455 Other specified heart block: Secondary | ICD-10-CM

## 2017-07-03 DIAGNOSIS — I2510 Atherosclerotic heart disease of native coronary artery without angina pectoris: Secondary | ICD-10-CM

## 2017-07-03 LAB — NM MYOCAR MULTI W/SPECT W/WALL MOTION / EF
CHL CUP NUCLEAR SDS: 1
CSEPPHR: 100 {beats}/min
LVDIAVOL: 136 mL (ref 62–150)
LVSYSVOL: 66 mL
NUC STRESS TID: 1.1
RATE: 0.46
Rest HR: 78 {beats}/min
SRS: 9
SSS: 10

## 2017-07-03 MED ORDER — SODIUM CHLORIDE 0.9% FLUSH
INTRAVENOUS | Status: AC
  Administered 2017-07-03: 10 mL via INTRAVENOUS
  Filled 2017-07-03: qty 10

## 2017-07-03 MED ORDER — FUROSEMIDE 40 MG PO TABS: 40.0000 mg | ORAL_TABLET | Freq: Every day | ORAL | 3 refills | Status: DC

## 2017-07-03 MED ORDER — REGADENOSON 0.4 MG/5ML IV SOLN
INTRAVENOUS | Status: AC
  Administered 2017-07-03: 0.4 mg via INTRAVENOUS
  Filled 2017-07-03: qty 5

## 2017-07-03 MED ORDER — TECHNETIUM TC 99M TETROFOSMIN IV KIT
10.0000 | PACK | Freq: Once | INTRAVENOUS | Status: AC | PRN
  Administered 2017-07-03: 11 via INTRAVENOUS

## 2017-07-03 MED ORDER — TECHNETIUM TC 99M TETROFOSMIN IV KIT
30.0000 | PACK | Freq: Once | INTRAVENOUS | Status: AC | PRN
  Administered 2017-07-03: 31 via INTRAVENOUS

## 2017-07-03 NOTE — Patient Instructions (Signed)
Medication Instructions:  Your physician recommends that you continue on your current medications as directed. Please refer to the Current Medication list given to you today.   Labwork: NONE   Testing/Procedures: NONE   Follow-Up: Your physician recommends that you schedule a follow-up appointment with Dr. Lovena Le ( Someone will call with your appointment)   Any Other Special Instructions Will Be Listed Below (If Applicable).     If you need a refill on your cardiac medications before your next appointment, please call your pharmacy.  Thank you for choosing [facility]!

## 2017-07-03 NOTE — Telephone Encounter (Signed)
Respiratory Dept from [facility] called left message for the nurse to return call. [phone]

## 2017-07-03 NOTE — Progress Notes (Signed)
Cardiology Office Note   Date:  07/03/2017   ID:  Randy, Hebert [birth year], MRN [id number]  PCP:  Raylene Everts, MD  Cardiologist:  Carlyle Dolly, MD  Chief Complaint  Patient presents with  . Coronary Artery Disease  . Loss of Consciousness      History of Present Illness: Randy Hebert is a 51 y.o. male who presents for ongoing assessment and management of coronary artery disease, history of obstructive sleep apnea, obesity, hyperlipidemia, with other history to include COPD on home O2, and diabetes.  We will seen the patient posthospitalization in the setting of a syncopal episode.  The patient underwent invasive EP study which demonstrated no inducible VT and no evidence of baseline severe conduction system disease.  He underwent insertion of a Medtronic ILR for unexplained syncope.  This is an MDT ILR which was placed on 06/29/2017.  He was advised that he was unable to continue driving for a minimum of 6 months in the setting of these syncopal events.  She was scheduled for a stress Myoview for evaluation of progression of CAD.  This was completed today.  Study Result    There was no ST segment deviation noted during stress.  Multiple sinus pauses during study, both prior and after regadenoson injection. Longest pause was 7.6 seconds. Patient has been followed by EP  Findings consistent with prior anterior myocardial infarction without current ischemia.  This is a low risk study.  The left ventricular ejection fraction is normal (55-65%).   The patient comes today with continued episodes of dizziness and "foggy feeling".  Past Medical History:  Diagnosis Date  . Allergy   . Anxiety   . Arthritis   . Asthma    hip replacement  . Back pain   . Bulging of cervical intervertebral disc   . CAD (coronary artery disease)    lateral STEMI 02/06/2015 00% D1 occlusion treated with Promus Premier 2.5 mm x 16 mm DES, 70% ramus stenosis, 40% mid RCA stenosis,  45% distal RCA stenosis, EF 45-50%  . CHF (congestive heart failure) ([place])   . COPD (chronic obstructive pulmonary disease) ([place])   . Depression   . Diabetes mellitus without complication ([place])   . 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 Dry eye   . Dyspnea   . Early satiety 09/23/2016  . GERD (gastroesophageal reflux disease)   . Headache   . Heart murmur   . Hip pain   . Hyperlipidemia   . Hypertension   . MI (myocardial infarction) ([place])   . Myocardial infarction ([place])   . Neck pain   . Otitis media   . Pleurisy   . Sinus pause    9 sec sinus pause on telemetry after started on coreg after MI, avoid AV nodal blocking agent  . Sleep apnea   . 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 Unilateral primary osteoarthritis, right hip 07/01/2016    Past Surgical History:  Procedure Laterality Date  . BIOPSY  10/09/2016   Procedure: BIOPSY;  Surgeon: Daneil Dolin, MD;  [facility];  Service: Endoscopy;;  . CARDIAC CATHETERIZATION N/A 02/06/2015   Procedure: Left Heart Cath and Coronary Angiography;  Surgeon: Leonie Man, MD;  Location: [facility] CV LAB;  Service: Cardiovascular;  Laterality: N/A;  . CARDIAC CATHETERIZATION N/A 02/06/2015   Procedure: Coronary Stent Intervention;  Surgeon: Leonie Man, MD;  Location: [facility]  INVASIVE CV LAB;  Service: Cardiovascular;  Laterality: N/A;  . COLONOSCOPY WITH PROPOFOL N/A 10/09/2016   Procedure: COLONOSCOPY WITH PROPOFOL;  Surgeon: Daneil Dolin, MD;  [facility];  Service: Endoscopy;  Laterality: N/A;  12:00 pm  . COLONOSCOPY WITH PROPOFOL N/A 11/24/2016   Procedure: COLONOSCOPY WITH PROPOFOL;  Surgeon: Daneil Dolin, MD;  [facility];  Service: Endoscopy;  Laterality: N/A;  200  . CORONARY ANGIOPLASTY WITH STENT PLACEMENT  01/2015  . CORONARY STENT INTERVENTION N/A 04/27/2017   Procedure: CORONARY STENT INTERVENTION;  Surgeon: Nelva Bush, MD;  Location: [facility] CV LAB;  Service: Cardiovascular;  Laterality: N/A;  . ELECTROPHYSIOLOGY STUDY N/A 06/29/2017   Procedure: ELECTROPHYSIOLOGY STUDY;  Surgeon: Evans Lance, MD;  Location: [facility] CV LAB;  Service: Cardiovascular;  Laterality: N/A;  . ESOPHAGOGASTRODUODENOSCOPY (EGD) WITH PROPOFOL N/A 10/09/2016   Procedure: ESOPHAGOGASTRODUODENOSCOPY (EGD) WITH PROPOFOL;  Surgeon: Daneil Dolin, MD;  [facility];  Service: Endoscopy;  Laterality: N/A;  . INCISION / DRAINAGE HAND / FINGER    . LEFT HEART CATH AND CORONARY ANGIOGRAPHY N/A 04/27/2017   Procedure: LEFT HEART CATH AND CORONARY ANGIOGRAPHY;  Surgeon: Nelva Bush, MD;  Location: [facility] LAB;  Service: Cardiovascular;  Laterality: N/A;  . LOOP RECORDER INSERTION  06/29/2017   Procedure: Loop Recorder Insertion;  Surgeon: Evans Lance, MD;  Location: [facility] CV LAB;  Service: Cardiovascular;;  . POLYPECTOMY  11/24/2016   Procedure: POLYPECTOMY;  Surgeon: Daneil Dolin, MD;  [facility];  Service: Endoscopy;;  descending and sigmoid  . TOTAL HIP ARTHROPLASTY Right 07/01/2016  . TOTAL HIP ARTHROPLASTY Right 07/01/2016   Procedure: RIGHT TOTAL HIP ARTHROPLASTY ANTERIOR APPROACH;  Surgeon: Mcarthur Rossetti, MD;  Location: [facility];  Service: Orthopedics;  Laterality: Right;     Current Outpatient Prescriptions  Medication Sig Dispense Refill  . acetaminophen (TYLENOL) 325 MG tablet Take 650 mg by mouth every 6 (six) hours as needed for mild pain.    Marland Kitchen albuterol (PROVENTIL HFA;VENTOLIN HFA) 108 (90 Base) MCG/ACT inhaler Inhale 2 puffs into the lungs every 6 (six) hours as needed for wheezing or shortness of breath. 1 Inhaler 4  . albuterol (PROVENTIL) (2.5 MG/3ML) 0.083% nebulizer solution Take 3 mLs (2.5 mg total) by nebulization every 6 (six) hours as needed for wheezing or shortness of breath. 150 mL 1  . aspirin 81 MG EC tablet Take 1 tablet (81 mg total) by mouth daily. 30 tablet   .  atorvastatin (LIPITOR) 80 MG tablet Take 80 mg by mouth daily.    . Carboxymeth-Glycerin-Polysorb (REFRESH OPTIVE ADVANCED) 0.5-1-0.5 % SOLN Apply 1 drop to eye 2 (two) times daily as needed (dry eyes).     . cycloSPORINE (RESTASIS) 0.05 % ophthalmic emulsion Place 1 drop into both eyes 2 (two) times daily.    . furosemide (LASIX) 40 MG tablet Take 1 tablet (40 mg total) by mouth daily. 90 tablet 3  . LORazepam (ATIVAN) 0.5 MG tablet Take 1 tablet (0.5 mg total) by mouth 2 (two) times daily as needed for anxiety. 30 tablet 0  . Magnesium 200 MG TABS Take 1 tablet (200 mg total) by mouth daily. 30 each 6  . metFORMIN (GLUCOPHAGE) 1000 MG tablet Take 1 tablet (1,000 mg total) by mouth 2 (two) times daily with a meal. 180 tablet 3  . mometasone-formoterol (DULERA) 100-5 MCG/ACT AERO Inhale 2 puffs into the lungs 2 (two) times daily. 3  Inhaler 3  . nicotine (NICODERM CQ - DOSED IN MG/24 HOURS) 21 mg/24hr patch Place 1 patch (21 mg total) onto the skin daily. (Patient taking differently: Place 21 mg onto the skin daily. ) 28 patch 0  . nitroGLYCERIN (NITROSTAT) 0.4 MG SL tablet Place 1 tablet (0.4 mg total) under the tongue every 5 (five) minutes as needed for chest pain. 25 tablet 3  . Omega-3 Fatty Acids (FISH OIL) 1200 MG CAPS Take 1,200 mg by mouth 2 (two) times daily.    . pantoprazole (PROTONIX) 40 MG tablet Take 1 tablet (40 mg total) by mouth daily. 30 tablet 11  . potassium chloride SA (K-DUR,KLOR-CON) 20 MEQ tablet Take 1 tablet (20 mEq total) by mouth daily. 30 tablet 3  . prasugrel (EFFIENT) 10 MG TABS tablet Take 1 tablet (10 mg total) by mouth daily. 90 tablet 1  . traZODone (DESYREL) 100 MG tablet Take 100 mg by mouth at bedtime.      No current facility-administered medications for this visit.     Allergies:   Carvedilol; Lisinopril; and Amoxicillin    Social History:  The patient  reports that he has been smoking Cigarettes.  He started smoking about 38 years ago. He has a 46.00  pack-year smoking history. He has quit using smokeless tobacco. His smokeless tobacco use included Chew. He reports that he drinks about 6.6 - 7.2 oz of alcohol per week . He reports that he does not use drugs.   Family History:  The patient's family history includes Alzheimer's disease in his maternal grandmother; Arthritis in his father and mother; Cancer in his mother; Diabetes in his maternal uncle; Early death in his brother; Heart attack in his father; Heart disease in his father; Heart disease (age of onset: 38) in his brother; Stroke in his father.    ROS: All other systems are reviewed and negative. Unless otherwise mentioned in H&P    PHYSICAL EXAM: VS:  BP 136/66   Pulse 100   Ht 5\' 5"  (1.651 m)   Wt 231 lb (104.8 kg)   SpO2 90%   BMI 38.44 kg/m  , BMI Body mass index is 38.44 kg/m. GEN: Well nourished, well developed, in no acute distress  HEENT: normal  Neck: no JVD, carotid bruits, or masses Cardiac: RRR; no murmurs, rubs, or gallops,no edema  Respiratory:  clear to auscultation bilaterally, normal work of breathing GI: soft, nontender, nondistended, + BS MS: no deformity or atrophy  Skin: warm and dry, no rash Neuro:  Strength and sensation are intact Psych: euthymic mood, full affect   Recent Labs: 04/15/2017: ALT 38; Brain Natriuretic Peptide 32.6 04/26/2017: TSH 1.635 05/16/2017: Magnesium 1.8 06/25/2017: BUN 10; Creat 0.78; Hemoglobin 13.9; Platelets 329; Potassium 4.3; Sodium 136    Lipid Panel    Component Value Date/Time   CHOL 93 04/27/2017 0505   TRIG 206 (H) 04/27/2017 0505   HDL 18 (L) 04/27/2017 0505   CHOLHDL 5.2 04/27/2017 0505   VLDL 41 (H) 04/27/2017 0505   LDLCALC 34 04/27/2017 0505      Wt Readings from Last 3 Encounters:  07/03/17 231 lb (104.8 kg)  06/30/17 231 lb 0.7 oz (104.8 kg)  06/25/17 232 lb (105.2 kg)      Other studies Reviewed: Cardiac Cath 04/27/2017 Conclusion   Conclusions: 1. Severe single-vessel coronary artery  disease with subtotal thrombotic occlusion of proximal ramus intermedius with TIMI-1 flow. 2. Mild to moderate disease involving the LAD and RCA, which are large and  ectatic vessels. Previously placed diagonal stent is widely patent. 3. Moderately reduced LVEF with mid and apical anterolateral hypokinesis. 4. Mildly elevated left ventricular filling pressure. 5. Successful PCI to proximal ramus intermedius with placement of a Xience Sierra 2.75 x 15 mm drug-eluting stent (post dilated to 3.1 mm) with 0% residual stenosis and TIMI-3 flow.  Recommendations: 1. Continue tirofiban for 4 hours post procedure. 2. Dual antiplatelet therapy with aspirin and present growth for at least 12 months (patient has history of intolerance to ticagrelor). 3. Aggressive secondary prevention and medical therapy of CAD and ischemic cardiomyopathy with moderately reduced LVEF. Smoking cessation strongly encouraged.   Echocardiogram 04/27/2017 Left ventricle: The cavity size was normal. There was mild   concentric hypertrophy. Systolic function was mildly to   moderately reduced. The estimated ejection fraction was in the   range of 40% to 45%. Diffuse hypokinesis. Doppler parameters are   consistent with abnormal left ventricular relaxation (grade 1   diastolic dysfunction). Doppler parameters are consistent with   elevated ventricular end-diastolic filling pressure. - Ventricular septum: Septal motion showed paradox. - Aortic valve: Trileaflet; normal thickness leaflets. There was no   regurgitation. - Mitral valve: Structurally normal valve. There was trivial   regurgitation. - Right ventricle: The cavity size was moderately dilated. Wall   thickness was normal. Systolic function was moderately reduced. - Right atrium: The atrium was normal in size. - Tricuspid valve: There was trivial regurgitation. - Pulmonary arteries: Systolic pressure was within the normal   range. - Inferior vena cava: The vessel was  dilated. The respirophasic   diameter changes were blunted (< 50%), consistent with elevated   central venous pressure. - Pericardium, extracardiac: There was no pericardial effusion.  ASSESSMENT AND PLAN:  1.  Abnormal stress test: Stress test revealing multiple pauses with a 7.6-second pause noted.  The patient has a loop recorder in place.  I will refer him to Dr. Lovena Le for discussion of need for pacemaker implantation based upon these findings.  I have discussed this with the patient who verbalizes understanding and is willing to have pacemaker implantation.  The patient again is advised not to drive.  2.  Syncopal episodes: Review of loop recorder tracings did not find significant pauses or arrhythmias up into this point.  However stress test did document several pauses as discussed above.  I believe this may be the etiology of his syncope.  He is not on any AV nodal blocking agents.  Referral to EP as discussed.  3.  Coronary artery disease: Most recent cardiac catheterization as above.  Continue current medication regimen, he is on Effient 10 mg daily which will likely need to be held prior to pacemaker placement.  Will defer to Dr. Lovena Le concerning timing.  Continue secondary prevention with statin therapy.  4.  Diabetes: Continue current medication regimen.  Consider GLP-1 inhibitor in the setting of CAD.  Current medicines are reviewed at length with the patient today.    Labs/ tests ordered today include:   Phill Myron. West Pugh, ANP, AACC   07/03/2017 1:51 PM    [facility] [address], [place], [place] [address] Phone: [phone]; Fax: [phone]

## 2017-07-06 ENCOUNTER — Telehealth: Payer: Self-pay | Admitting: *Deleted

## 2017-07-06 ENCOUNTER — Ambulatory Visit: Payer: Medicaid Other | Admitting: Cardiology

## 2017-07-06 ENCOUNTER — Encounter (HOSPITAL_COMMUNITY)
Admission: RE | Admit: 2017-07-06 | Discharge: 2017-07-06 | Disposition: A | Discharge status: Home / Self Care | Payer: Medicaid Other | Source: Ambulatory Visit | Attending: Cardiology | Admitting: Cardiology

## 2017-07-06 DIAGNOSIS — F17210 Nicotine dependence, cigarettes, uncomplicated: Secondary | ICD-10-CM | POA: Diagnosis not present

## 2017-07-06 DIAGNOSIS — I214 Non-ST elevation (NSTEMI) myocardial infarction: Secondary | ICD-10-CM | POA: Diagnosis not present

## 2017-07-06 DIAGNOSIS — Z955 Presence of coronary angioplasty implant and graft: Secondary | ICD-10-CM

## 2017-07-06 NOTE — Telephone Encounter (Signed)
Spoke with patient, walked him through manual transmission process.  Manual transmission received.  All available symptom episode ECGs show sinus rhythm/sinus tach, some with artifact.  Pause episode from 11/2 at 0837 is appropriate, duration 4sec, occurred during patient's stress test.  Patient does not recall any presyncopal symptoms during episode.  Pause episode from 11/4 at 0014 is actually a brady episode, nocturnal, patient asymptomatic and sleeping at the time.  Dr. Rayann Heman reviewed 11/2 episode, advised that it appears vagal in nature, recommended to keep 07/15/17 appointment with Dr. Lovena Le.  Advised patient that going forward, he should limit his symptom activator use to 1-2 times daily and use it for his worst symptoms (syncope).  Patient has been using it every time he feels dizzy, which is generally after exercise when he has been breathing hard, or while taking a shower.  Data has been overwritten due to using it 4+ times a day.  Patient verbalizes understanding of recommendations and agreement with plan.  He is appreciative of call and denies additional questions or concerns at this time.

## 2017-07-06 NOTE — Telephone Encounter (Signed)
Called and left message to call office

## 2017-07-06 NOTE — Progress Notes (Signed)
Daily Session Note  Patient Details  Name: Randy Hebert MRN: [id number] Date of Birth: 07/11/1966 Referring Provider:     CARDIAC REHAB PHASE II ORIENTATION from 06/22/2017 in [place]  Referring Provider  Dr. [name]      Encounter Date: 07/06/2017  Check In: Session Check In - 07/06/17 0824      Check-In   Location  [facility]    Staff Present  Suzanne Boron, BS, EP, Exercise Physiologist;Debra Wynetta Emery, RN, BSN    Supervising physician immediately available to respond to emergencies  See telemetry face sheet for immediately available MD    Medication changes reported      No    Fall or balance concerns reported     No    Warm-up and Cool-down  Performed as group-led instruction    Resistance Training Performed  Yes    VAD Patient?  No      Pain Assessment   Currently in Pain?  No/denies    Pain Score  0-No pain    Multiple Pain Sites  No       Capillary Blood Glucose: No results found for this or any previous visit (from the past 24 hour(s)).    Social History   Tobacco Use  Smoking Status Current Every Day Smoker  . Packs/day: 1.00  . Years: 46.00  . Pack years: 46.00  . Types: Cigarettes  . Start date: 06/07/1979  Smokeless Tobacco Former Systems developer  . Types: Chew    Goals Met:  Independence with exercise equipment Exercise tolerated well No report of cardiac concerns or symptoms Strength training completed today  Goals Unmet:  Not Applicable  Comments: Check out 915   Dr. Kate Sable is Medical Director for [place] and Pulmonary Rehab.

## 2017-07-06 NOTE — Telephone Encounter (Signed)
LMOVM (DPR) requesting that patient send a manual Care[facility] transmission for review.  [facility] phone number for questions/concerns.    Received alert for 2 "pause" episodes but no ECGs transmitted automatically.  Also received alert for 21 "symptom" episodes--available ECGs show SR/ST with artifact.  Will review full report when received.

## 2017-07-06 NOTE — Telephone Encounter (Signed)
Follow up     Pt is returning call to nurse.

## 2017-07-07 ENCOUNTER — Telehealth: Payer: Self-pay | Admitting: *Deleted

## 2017-07-07 NOTE — Telephone Encounter (Signed)
Spoke with patient, walked him through manual transmission process.  Manual transmission received.  Symptom episode from 11/6 shows SR/ST with artifact. 2 pause episodes from 07/06/17 are appropriate, longest 9sec, both nocturnal.  Patient asymptomatic and asleep during pause episodes.  Patient reports dizziness with position changes and states this is why he used his symptom activator earlier today.  Advised patient that positional dizziness is more likely related to BP and encouraged him to change positions slowly and purposefully.  Patient verbalizes understanding.  Patient reports he had some exertional chest discomfort today.  Took nitro SL x1 and pain resolved.  Reminded patient that if he needs to use nitro three times, or if symptoms worsen, he should call seek emergency medical attention.  Patient verbalizes understanding and denies additional questions or concerns at this time.

## 2017-07-08 ENCOUNTER — Encounter (HOSPITAL_COMMUNITY)
Admission: RE | Admit: 2017-07-08 | Discharge: 2017-07-08 | Disposition: A | Discharge status: Home / Self Care | Payer: Medicaid Other | Source: Ambulatory Visit | Attending: Cardiology | Admitting: Cardiology

## 2017-07-08 DIAGNOSIS — I214 Non-ST elevation (NSTEMI) myocardial infarction: Secondary | ICD-10-CM

## 2017-07-08 DIAGNOSIS — Z955 Presence of coronary angioplasty implant and graft: Secondary | ICD-10-CM

## 2017-07-08 NOTE — Progress Notes (Signed)
Patient received the take home exercise plan today 07/08/2017. THR was addressed as were safe ways to be active outside of Rehab. Patient demonstrated an understanding. Patient was encouraged to ask any future questions.

## 2017-07-08 NOTE — Progress Notes (Signed)
Daily Session Note  Patient Details  Name: Randy Hebert MRN: [id number] Date of Birth: [birth year] Referring Provider:     CARDIAC REHAB PHASE II ORIENTATION from 06/22/2017 in [place]  Referring Provider  Dr. [name]      Encounter Date: 07/08/2017  Check In: Session Check In - 07/08/17 0824      Check-In   Location  [facility]    Staff Present  Suzanne Boron, BS, EP, Exercise Physiologist;Debra Wynetta Emery, RN, BSN    Supervising physician immediately available to respond to emergencies  See telemetry face sheet for immediately available MD    Medication changes reported      No    Fall or balance concerns reported     No    Tobacco Cessation  No Change    Warm-up and Cool-down  Performed as group-led instruction    Resistance Training Performed  Yes    VAD Patient?  No      Pain Assessment   Currently in Pain?  No/denies    Pain Score  0-No pain    Multiple Pain Sites  No       Capillary Blood Glucose: No results found for this or any previous visit (from the past 24 hour(s)).    Social History   Tobacco Use  Smoking Status Current Every Day Smoker  . Packs/day: 1.00  . Years: 46.00  . Pack years: 46.00  . Types: Cigarettes  . Start date: 06/07/1979  Smokeless Tobacco Former Systems developer  . Types: Chew    Goals Met:  Independence with exercise equipment Exercise tolerated well No report of cardiac concerns or symptoms Strength training completed today  Goals Unmet:  Not Applicable  Comments: Check out 915   Dr. Kate Sable is Medical Director for [facility] and Pulmonary Rehab.

## 2017-07-10 ENCOUNTER — Ambulatory Visit: Payer: Medicaid Other | Admitting: Family Medicine

## 2017-07-10 ENCOUNTER — Encounter: Payer: Self-pay | Admitting: Family Medicine

## 2017-07-10 ENCOUNTER — Telehealth: Payer: Self-pay | Admitting: *Deleted

## 2017-07-10 ENCOUNTER — Other Ambulatory Visit: Payer: Self-pay

## 2017-07-10 ENCOUNTER — Encounter (HOSPITAL_COMMUNITY)
Admission: RE | Admit: 2017-07-10 | Discharge: 2017-07-10 | Disposition: A | Discharge status: Home / Self Care | Payer: Medicaid Other | Source: Ambulatory Visit | Attending: Cardiology | Admitting: Cardiology

## 2017-07-10 VITALS — BP 128/80 | HR 100 | Temp 98.6°F | Resp 28 | Ht 66.0 in | Wt 232.1 lb

## 2017-07-10 DIAGNOSIS — I10 Essential (primary) hypertension: Secondary | ICD-10-CM | POA: Diagnosis not present

## 2017-07-10 DIAGNOSIS — R55 Syncope and collapse: Secondary | ICD-10-CM

## 2017-07-10 DIAGNOSIS — F17200 Nicotine dependence, unspecified, uncomplicated: Secondary | ICD-10-CM | POA: Diagnosis not present

## 2017-07-10 DIAGNOSIS — I25110 Atherosclerotic heart disease of native coronary artery with unstable angina pectoris: Secondary | ICD-10-CM | POA: Diagnosis not present

## 2017-07-10 DIAGNOSIS — I214 Non-ST elevation (NSTEMI) myocardial infarction: Secondary | ICD-10-CM

## 2017-07-10 DIAGNOSIS — E1165 Type 2 diabetes mellitus with hyperglycemia: Secondary | ICD-10-CM

## 2017-07-10 DIAGNOSIS — Z955 Presence of coronary angioplasty implant and graft: Secondary | ICD-10-CM

## 2017-07-10 DIAGNOSIS — Z9119 Patient's noncompliance with other medical treatment and regimen: Secondary | ICD-10-CM | POA: Insufficient documentation

## 2017-07-10 DIAGNOSIS — Z91199 Patient's noncompliance with other medical treatment and regimen due to unspecified reason: Secondary | ICD-10-CM | POA: Insufficient documentation

## 2017-07-10 NOTE — Progress Notes (Signed)
Daily Session Note  Patient Details  Name: MICHIAH MUDRY MRN: [id number] Date of Birth: 1966/06/08 Referring Provider:     CARDIAC REHAB PHASE II ORIENTATION from 06/22/2017 in [facility]  Referring Provider  Dr. [name]      Encounter Date: 07/10/2017  Check In: Session Check In - 07/10/17 0808      Check-In   Location  [facility]    Staff Present  Suzanne Boron, BS, EP, Exercise Physiologist;Debra Wynetta Emery, RN, BSN    Supervising physician immediately available to respond to emergencies  See telemetry face sheet for immediately available MD    Medication changes reported      No    Fall or balance concerns reported     No    Warm-up and Cool-down  Performed as group-led instruction    Resistance Training Performed  Yes    VAD Patient?  No      Pain Assessment   Currently in Pain?  No/denies    Pain Score  0-No pain    Multiple Pain Sites  No       Capillary Blood Glucose: No results found for this or any previous visit (from the past 24 hour(s)).    Social History   Tobacco Use  Smoking Status Current Every Day Smoker  . Packs/day: 1.00  . Years: 46.00  . Pack years: 46.00  . Types: Cigarettes  . Start date: 06/07/1979  Smokeless Tobacco Former Systems developer  . Types: Chew    Goals Met:  Independence with exercise equipment Exercise tolerated well No report of cardiac concerns or symptoms Strength training completed today  Goals Unmet:  Not Applicable  Comments: Check out 915   Dr. Kate Sable is Medical Director for [facility] and Pulmonary Rehab.
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
## 2017-07-10 NOTE — Telephone Encounter (Signed)
Spoke with patient regarding sending manual transmission and symptoms associated with pauses. Manual transmission received. Patient states asymptomatic and was asleep during pause episodes. 2 pause episodes 11/6 and 11/7, longest 7 seconds, both nocturnal. 1 symptom activated episode appears SR/ST on 07/08/17, patient states he stood up from the chair abruptly and felt dizzy. Advised patient to change positions slowly. Patient verbalized understanding.   Advised patient to call if symptoms of syncope or presyncope. Patient verbalized understanding to call [facility] if he has symptoms or syncope.   Reminded patient of follow-up 07/15/17 with Dr. Lovena Le.

## 2017-07-10 NOTE — Patient Instructions (Signed)
Use the patches Limit the cigarettes  Every day reduce the smoking more It is urgent that you quit  Need to improve the diabetes care/ stop sweets and limit carbs Need referral to the diabetes specialist Need diabetes education  We cannot complete the forms until cleared by cardiology

## 2017-07-13 ENCOUNTER — Ambulatory Visit: Payer: Medicaid Other

## 2017-07-13 ENCOUNTER — Encounter (HOSPITAL_COMMUNITY)
Admission: RE | Admit: 2017-07-13 | Discharge: 2017-07-13 | Disposition: A | Discharge status: Home / Self Care | Payer: Medicaid Other | Source: Ambulatory Visit | Attending: Cardiology | Admitting: Cardiology

## 2017-07-13 DIAGNOSIS — I214 Non-ST elevation (NSTEMI) myocardial infarction: Secondary | ICD-10-CM

## 2017-07-13 DIAGNOSIS — Z955 Presence of coronary angioplasty implant and graft: Secondary | ICD-10-CM

## 2017-07-13 NOTE — Progress Notes (Signed)
Daily Session Note  Patient Details  Name: Randy Hebert MRN: [id number] Date of Birth: 09/13/1965 Referring Provider:     CARDIAC REHAB PHASE II ORIENTATION from 06/22/2017 in [facility]  Referring Provider  Dr. [name]      Encounter Date: 07/13/2017  Check In: Session Check In - 07/13/17 0807      Check-In   Location  [facility]    Staff Present  Suzanne Boron, BS, EP, Exercise Physiologist;Debra Wynetta Emery, RN, BSN    Supervising physician immediately available to respond to emergencies  See telemetry face sheet for immediately available MD    Medication changes reported      No    Fall or balance concerns reported     No    Warm-up and Cool-down  Performed as group-led instruction    Resistance Training Performed  Yes    VAD Patient?  No      Pain Assessment   Currently in Pain?  No/denies    Pain Score  0-No pain    Multiple Pain Sites  No       Capillary Blood Glucose: No results found for this or any previous visit (from the past 24 hour(s)).    Social History   Tobacco Use  Smoking Status Current Every Day Smoker  . Packs/day: 1.00  . Years: 46.00  . Pack years: 46.00  . Types: Cigarettes  . Start date: 06/07/1979  Smokeless Tobacco Former Systems developer  . Types: Chew    Goals Met:  Independence with exercise equipment Exercise tolerated well No report of cardiac concerns or symptoms Strength training completed today  Goals Unmet:  Not Applicable  Comments: Check out 915   Dr. Kate Sable is Medical Director for [place] and Pulmonary Rehab.

## 2017-07-15 ENCOUNTER — Ambulatory Visit: Payer: Medicaid Other | Admitting: Internal Medicine

## 2017-07-15 ENCOUNTER — Encounter: Payer: Self-pay | Admitting: Internal Medicine

## 2017-07-15 ENCOUNTER — Encounter (HOSPITAL_COMMUNITY)
Admission: RE | Admit: 2017-07-15 | Discharge: 2017-07-15 | Disposition: A | Discharge status: Home / Self Care | Payer: Medicaid Other | Source: Ambulatory Visit | Attending: Cardiology | Admitting: Cardiology

## 2017-07-15 VITALS — BP 140/78 | HR 89 | Ht 66.0 in | Wt 235.0 lb

## 2017-07-15 DIAGNOSIS — I214 Non-ST elevation (NSTEMI) myocardial infarction: Secondary | ICD-10-CM | POA: Diagnosis not present

## 2017-07-15 DIAGNOSIS — R55 Syncope and collapse: Secondary | ICD-10-CM | POA: Diagnosis not present

## 2017-07-15 DIAGNOSIS — Z955 Presence of coronary angioplasty implant and graft: Secondary | ICD-10-CM

## 2017-07-15 DIAGNOSIS — I455 Other specified heart block: Secondary | ICD-10-CM

## 2017-07-15 NOTE — Patient Instructions (Signed)
Medication Instructions:  Your physician recommends that you continue on your current medications as directed. Please refer to the Current Medication list given to you today.  Labwork: None ordered.  Testing/Procedures: None ordered.  Follow-Up: Your physician wants you to follow-up in: 6 months with Dr. Lovena Le in [place].  You will receive a reminder letter in the mail two months in advance. If you don't receive a letter, please call our office to schedule the follow-up appointment.   Any Other Special Instructions Will Be Listed Below (If Applicable).     If you need a refill on your cardiac medications before your next appointment, please call your pharmacy.

## 2017-07-15 NOTE — Progress Notes (Signed)
HPI Mr. Riemenschneider returns today for followup of syncope om the setting of CAD and mild LV dysfunction. He has had a remote MI. He has undergone ILR insertion and has done well but was noted to have prolonged pauses on ILR interogation, mostly nocturnal and all asymptomatic. No chest pain or sob. No edema.  Allergies  Allergen Reactions  . Carvedilol Other (See Comments)    Sinus pause on telemetry >3 seconds. Longest one 9 sec. No AV nodal agent  . Lisinopril Anaphylaxis, Shortness Of Breath and Swelling    Angioedema, required intubation and mechanical ventilation  . Amoxicillin Nausea And Vomiting    Nausea only - no allergy     Current Outpatient Medications  Medication Sig Dispense Refill  . acetaminophen (TYLENOL) 325 MG tablet Take 650 mg by mouth every 6 (six) hours as needed for mild pain.    Marland Kitchen albuterol (PROVENTIL HFA;VENTOLIN HFA) 108 (90 Base) MCG/ACT inhaler Inhale 2 puffs into the lungs every 6 (six) hours as needed for wheezing or shortness of breath. 1 Inhaler 4  . albuterol (PROVENTIL) (2.5 MG/3ML) 0.083% nebulizer solution Take 3 mLs (2.5 mg total) by nebulization every 6 (six) hours as needed for wheezing or shortness of breath. 150 mL 1  . aspirin 81 MG EC tablet Take 1 tablet (81 mg total) by mouth daily. 30 tablet   . atorvastatin (LIPITOR) 80 MG tablet Take 80 mg by mouth daily.    . Carboxymeth-Glycerin-Polysorb (REFRESH OPTIVE ADVANCED) 0.5-1-0.5 % SOLN Apply 1 drop to eye 2 (two) times daily as needed (dry eyes).     . citalopram (CELEXA) 20 MG tablet Take 20 mg daily by mouth.    . cycloSPORINE (RESTASIS) 0.05 % ophthalmic emulsion Place 1 drop into both eyes 2 (two) times daily.    . furosemide (LASIX) 40 MG tablet Take 1 tablet (40 mg total) by mouth daily. 90 tablet 3  . Magnesium 200 MG TABS Take 1 tablet (200 mg total) by mouth daily. 30 each 6  . metFORMIN (GLUCOPHAGE) 1000 MG tablet Take 1 tablet (1,000 mg total) by mouth 2 (two) times daily with a  meal. 180 tablet 3  . mometasone-formoterol (DULERA) 100-5 MCG/ACT AERO Inhale 2 puffs into the lungs 2 (two) times daily. 3 Inhaler 3  . nicotine (NICODERM CQ - DOSED IN MG/24 HOURS) 21 mg/24hr patch Place 1 patch (21 mg total) onto the skin daily. (Patient taking differently: Place 21 mg onto the skin daily. ) 28 patch 0  . nitroGLYCERIN (NITROSTAT) 0.4 MG SL tablet Place 1 tablet (0.4 mg total) under the tongue every 5 (five) minutes as needed for chest pain. 25 tablet 3  . Omega-3 Fatty Acids (FISH OIL) 1200 MG CAPS Take 1,200 mg by mouth 2 (two) times daily.    . pantoprazole (PROTONIX) 40 MG tablet Take 1 tablet (40 mg total) by mouth daily. 30 tablet 11  . potassium chloride SA (K-DUR,KLOR-CON) 20 MEQ tablet Take 1 tablet (20 mEq total) by mouth daily. 30 tablet 3  . prasugrel (EFFIENT) 10 MG TABS tablet Take 1 tablet (10 mg total) by mouth daily. 90 tablet 1  . traZODone (DESYREL) 100 MG tablet Take 100 mg by mouth at bedtime.      No current facility-administered medications for this visit.      Past Medical History:  Diagnosis Date  . Allergy   . Anxiety   . Arthritis   . Asthma    hip replacement  .  Back pain   . Bulging of cervical intervertebral disc   . CAD (coronary artery disease)    lateral STEMI 02/06/2015 00% D1 occlusion treated with Promus Premier 2.5 mm x 16 mm DES, 70% ramus stenosis, 40% mid RCA stenosis, 45% distal RCA stenosis, EF 45-50%  . CHF (congestive heart failure) ([place])   . COPD (chronic obstructive pulmonary disease) ([place])   . Depression   . Diabetes mellitus without complication ([place])   . 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 Dry eye   . Dyspnea   . Early satiety 09/23/2016  . GERD (gastroesophageal reflux disease)   . Headache   . Heart murmur   . Hip pain   . Hyperlipidemia   . Hypertension   . MI (myocardial infarction) ([place])   . Myocardial infarction ([place])   . Neck pain   . Otitis media   .  Pleurisy   . Sinus pause    9 sec sinus pause on telemetry after started on coreg after MI, avoid AV nodal blocking agent  . Sleep apnea   . 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 Unilateral primary osteoarthritis, right hip 07/01/2016    ROS:   All systems reviewed and negative except as noted in the HPI.   Past Surgical History:  Procedure Laterality Date  . CORONARY ANGIOPLASTY WITH STENT PLACEMENT  01/2015  . INCISION / DRAINAGE HAND / FINGER    . TOTAL HIP ARTHROPLASTY Right 07/01/2016     Family History  Problem Relation Age of Onset  . Heart attack Father   . Stroke Father   . Arthritis Father   . Heart disease Father   . Cancer Mother        ???  . Arthritis Mother   . Heart disease Brother 79       died in sleep  . Early death Brother   . Diabetes Maternal Uncle   . Alzheimer's disease Maternal Grandmother      Social History   Socioeconomic History  . Marital status: Significant Other    Spouse name: alice  . Number of children: 3  . Years of education: 15  . Highest education level: Not on file  Social Needs  . Financial resource strain: Not on file  . Food insecurity - worry: Not on file  . Food insecurity - inability: Not on file  . Transportation needs - medical: Not on file  . Transportation needs - non-medical: Not on file  Occupational History  . Occupation: unemployed    Comment: Nature conservation officer    Comment: disability pending  Tobacco Use  . Smoking status: Current Every Day Smoker    Packs/day: 1.00    Years: 46.00    Pack years: 46.00    Types: Cigarettes    Start date: 06/07/1979  . Smokeless tobacco: Former Systems developer    Types: Chew  Substance and Sexual Activity  . Alcohol use: Yes    Alcohol/week: 6.6 - 7.2 oz    Types: 5 - 6 Shots of liquor, 6 Cans of beer per week    Comment: alcoholic, now drinks intermittently  . Drug use: No    Comment: history of drug use- marijuana, cocaine- quit 2014  . Sexual activity: Yes     Partners: Female    Birth control/protection: None  Other Topics Concern  . Not on file  Social History Narrative   Lives with girlfriend [place]  Leisure - TV     BP 140/78   Pulse 89   Ht 5\' 6"  (1.676 m)   Wt 235 lb (106.6 kg)   SpO2 93%   BMI 37.93 kg/m   Physical Exam:  Well appearing NAD HEENT: Unremarkable Neck:  No JVD, no thyromegally Lymphatics:  No adenopathy Back:  No CVA tenderness Lungs:  Clear with no wheezes HEART:  Regular rate rhythm, no murmurs, no rubs, no clicks Abd:  soft, positive bowel sounds, no organomegally, no rebound, no guarding Ext:  2 plus pulses, no edema, no cyanosis, no clubbing Skin:  No rashes no nodules Neuro:  CN II through XII intact, motor grossly intact  DEVICE  Normal device function.  See PaceArt for details. Episodes of sinus pause as well as noise artifact noted  Assess/Plan: 1. Syncope - the etiology is unclear. He will undergo watchful waiting. His pauses are nocturnal and asymptomatic. No significant tachy episodes. 2. CAD - he denies anginal symptoms. Will follow. 3. Obesity - I have strongly encouraged the patient to lose weight. 4. HTN - his blood pressure is up a bit today. He is encouraged to lose weight and reduce his salt intake.  Mikle Bosworth.D.

## 2017-07-15 NOTE — Progress Notes (Signed)
Daily Session Note  Patient Details  Name: IRISH PIECH MRN: [id number] Date of Birth: 07/19/1966 Referring Provider:     CARDIAC REHAB PHASE II ORIENTATION from 06/22/2017 in [facility]  Referring Provider  Dr. [name]      Encounter Date: 07/15/2017  Check In: Session Check In - 07/15/17 0828      Check-In   Location  [facility]    Staff Present  Suzanne Boron, BS, EP, Exercise Physiologist;Debra Wynetta Emery, RN, BSN    Supervising physician immediately available to respond to emergencies  See telemetry face sheet for immediately available MD    Medication changes reported      No    Fall or balance concerns reported     No    Warm-up and Cool-down  Performed as group-led instruction    Resistance Training Performed  Yes    VAD Patient?  No      Pain Assessment   Currently in Pain?  No/denies    Pain Score  0-No pain    Multiple Pain Sites  No       Capillary Blood Glucose: No results found for this or any previous visit (from the past 24 hour(s)).    Social History   Tobacco Use  Smoking Status Current Every Day Smoker  . Packs/day: 1.00  . Years: 46.00  . Pack years: 46.00  . Types: Cigarettes  . Start date: 06/07/1979  Smokeless Tobacco Former Systems developer  . Types: Chew    Goals Met:  Independence with exercise equipment Exercise tolerated well No report of cardiac concerns or symptoms Strength training completed today  Goals Unmet:  Not Applicable  Comments: Check out 915   Dr. Kate Sable is Medical Director for [place] and Pulmonary Rehab.

## 2017-07-17 ENCOUNTER — Encounter (HOSPITAL_COMMUNITY)
Admission: RE | Admit: 2017-07-17 | Discharge: 2017-07-17 | Disposition: A | Discharge status: Home / Self Care | Payer: Medicaid Other | Source: Ambulatory Visit | Attending: Cardiology | Admitting: Cardiology

## 2017-07-17 DIAGNOSIS — I214 Non-ST elevation (NSTEMI) myocardial infarction: Secondary | ICD-10-CM | POA: Diagnosis not present

## 2017-07-17 DIAGNOSIS — Z955 Presence of coronary angioplasty implant and graft: Secondary | ICD-10-CM

## 2017-07-17 NOTE — Progress Notes (Signed)
Daily Session Note  Patient Details  Name: Randy Hebert MRN: [id number] Date of Birth: 18-Jun-1966 Referring Provider:     CARDIAC REHAB PHASE II ORIENTATION from 06/22/2017 in [facility]  Referring Provider  Dr. [name]      Encounter Date: 07/17/2017  Check In: Session Check In - 07/17/17 0838      Check-In   Location  [facility]    Staff Present  Suzanne Boron, BS, EP, Exercise Physiologist;Debra Wynetta Emery, RN, BSN    Supervising physician immediately available to respond to emergencies  See telemetry face sheet for immediately available MD    Medication changes reported      No    Fall or balance concerns reported     No    Warm-up and Cool-down  Performed as group-led instruction    Resistance Training Performed  Yes    VAD Patient?  No      Pain Assessment   Currently in Pain?  No/denies    Pain Score  0-No pain    Multiple Pain Sites  No       Capillary Blood Glucose: No results found for this or any previous visit (from the past 24 hour(s)).    Social History   Tobacco Use  Smoking Status Current Every Day Smoker  . Packs/day: 1.00  . Years: 46.00  . Pack years: 46.00  . Types: Cigarettes  . Start date: 06/07/1979  Smokeless Tobacco Former Systems developer  . Types: Chew    Goals Met:  Independence with exercise equipment Exercise tolerated well No report of cardiac concerns or symptoms Strength training completed today  Goals Unmet:  Not Applicable  Comments: Check out 915   Dr. Kate Sable is Medical Director for [facility] and Pulmonary Rehab.

## 2017-07-20 ENCOUNTER — Encounter (HOSPITAL_COMMUNITY)
Admission: RE | Admit: 2017-07-20 | Discharge: 2017-07-20 | Disposition: A | Discharge status: Home / Self Care | Payer: Medicaid Other | Source: Ambulatory Visit | Attending: Cardiology | Admitting: Cardiology

## 2017-07-20 DIAGNOSIS — I214 Non-ST elevation (NSTEMI) myocardial infarction: Secondary | ICD-10-CM

## 2017-07-20 DIAGNOSIS — Z955 Presence of coronary angioplasty implant and graft: Secondary | ICD-10-CM

## 2017-07-20 NOTE — Progress Notes (Signed)
Daily Session Note  Patient Details  Name: Randy Hebert MRN: [id number] Date of Birth: August 31, 1966 Referring Provider:     CARDIAC REHAB PHASE II ORIENTATION from 06/22/2017 in [facility]  Referring Provider  Dr. [name]      Encounter Date: 07/20/2017  Check In: Session Check In - 07/20/17 0819      Check-In   Location  [facility]    Staff Present  Suzanne Boron, BS, EP, Exercise Physiologist;Debra Wynetta Emery, RN, BSN    Supervising physician immediately available to respond to emergencies  See telemetry face sheet for immediately available MD    Medication changes reported      No    Fall or balance concerns reported     No    Warm-up and Cool-down  Performed as group-led instruction    Resistance Training Performed  Yes    VAD Patient?  No      Pain Assessment   Currently in Pain?  No/denies    Pain Score  0-No pain    Multiple Pain Sites  No       Capillary Blood Glucose: No results found for this or any previous visit (from the past 24 hour(s)).    Social History   Tobacco Use  Smoking Status Current Every Day Smoker  . Packs/day: 1.00  . Years: 46.00  . Pack years: 46.00  . Types: Cigarettes  . Start date: 06/07/1979  Smokeless Tobacco Former Systems developer  . Types: Chew    Goals Met:  Independence with exercise equipment Exercise tolerated well No report of cardiac concerns or symptoms Strength training completed today  Goals Unmet:  Not Applicable  Comments: Check out 915   Dr. Kate Sable is Medical Director for [facility] and Pulmonary Rehab.

## 2017-07-22 ENCOUNTER — Encounter (HOSPITAL_COMMUNITY)
Admission: RE | Admit: 2017-07-22 | Discharge: 2017-07-22 | Disposition: A | Discharge status: Home / Self Care | Payer: Medicaid Other | Source: Ambulatory Visit | Attending: Cardiology | Admitting: Cardiology

## 2017-07-22 DIAGNOSIS — Z955 Presence of coronary angioplasty implant and graft: Secondary | ICD-10-CM

## 2017-07-22 DIAGNOSIS — I214 Non-ST elevation (NSTEMI) myocardial infarction: Secondary | ICD-10-CM | POA: Diagnosis not present

## 2017-07-22 NOTE — Progress Notes (Signed)
Daily Session Note  Patient Details  Name: Randy Hebert MRN: [id number] Date of Birth: 14-Sep-1965 Referring Provider:     CARDIAC REHAB PHASE II ORIENTATION from 06/22/2017 in [place]  Referring Provider  Dr. [name]      Encounter Date: 07/22/2017  Check In: Session Check In - 07/22/17 0805      Check-In   Location  [facility]    Staff Present  Diane Angelina Pih, MS, EP, [facility], Exercise Physiologist;Tedric Leeth Luther Parody, BS, EP, Exercise Physiologist;Debra Wynetta Emery, RN, BSN    Supervising physician immediately available to respond to emergencies  See telemetry face sheet for immediately available MD    Medication changes reported      No    Fall or balance concerns reported     No    Warm-up and Cool-down  Performed as group-led instruction    Resistance Training Performed  Yes    VAD Patient?  No      Pain Assessment   Currently in Pain?  No/denies    Pain Score  0-No pain    Multiple Pain Sites  No       Capillary Blood Glucose: No results found for this or any previous visit (from the past 24 hour(s)).    Social History   Tobacco Use  Smoking Status Current Every Day Smoker  . Packs/day: 1.00  . Years: 46.00  . Pack years: 46.00  . Types: Cigarettes  . Start date: 06/07/1979  Smokeless Tobacco Former Systems developer  . Types: Chew    Goals Met:  Independence with exercise equipment Exercise tolerated well No report of cardiac concerns or symptoms Strength training completed today  Goals Unmet:  Not Applicable  Comments: Check out 915   Dr. Kate Sable is Medical Director for [facility] and [facility].

## 2017-07-24 ENCOUNTER — Encounter (HOSPITAL_COMMUNITY): Payer: Medicaid Other

## 2017-07-27 ENCOUNTER — Encounter (HOSPITAL_COMMUNITY)
Admission: RE | Admit: 2017-07-27 | Discharge: 2017-07-27 | Disposition: A | Discharge status: Home / Self Care | Payer: Medicaid Other | Source: Ambulatory Visit | Attending: Cardiology | Admitting: Cardiology

## 2017-07-27 DIAGNOSIS — Z955 Presence of coronary angioplasty implant and graft: Secondary | ICD-10-CM

## 2017-07-27 DIAGNOSIS — I214 Non-ST elevation (NSTEMI) myocardial infarction: Secondary | ICD-10-CM | POA: Diagnosis not present

## 2017-07-27 NOTE — Progress Notes (Signed)
Daily Session Note  Patient Details  Name: Randy Hebert MRN: [id number] Date of Birth: 1965-12-21 Referring Provider:     CARDIAC REHAB PHASE II ORIENTATION from 06/22/2017 in [place]  Referring Provider  Dr. [name]      Encounter Date: 07/27/2017  Check In: Session Check In - 07/27/17 0828      Check-In   Location  [facility]    Staff Present  Suzanne Boron, BS, EP, Exercise Physiologist;Debra Wynetta Emery, RN, BSN    Supervising physician immediately available to respond to emergencies  See telemetry face sheet for immediately available MD    Medication changes reported      No    Fall or balance concerns reported     No    Warm-up and Cool-down  Performed as group-led instruction    Resistance Training Performed  Yes    VAD Patient?  No      Pain Assessment   Currently in Pain?  No/denies    Pain Score  0-No pain    Multiple Pain Sites  No       Capillary Blood Glucose: No results found for this or any previous visit (from the past 24 hour(s)).    Social History   Tobacco Use  Smoking Status Current Every Day Smoker  . Packs/day: 1.00  . Years: 46.00  . Pack years: 46.00  . Types: Cigarettes  . Start date: 06/07/1979  Smokeless Tobacco Former Systems developer  . Types: Chew    Goals Met:  Independence with exercise equipment Exercise tolerated well No report of cardiac concerns or symptoms Strength training completed today  Goals Unmet:  Not Applicable  Comments: Check out 915   Dr. Kate Sable is Medical Director for [place] and Pul[facility] Re[facility].

## 2017-07-28 ENCOUNTER — Telehealth: Payer: Self-pay | Admitting: *Deleted

## 2017-07-28 NOTE — Telephone Encounter (Signed)
Patient called and states the last time he seen Dr. Meda Coffee he left a form ( DOT questionnaire ) for her to complete. Patient states he has not heard if it was completed. Patient states that he can be called when the form is ready for pickup. His contact number is [phone]. Please advise. Thank you

## 2017-07-28 NOTE — Telephone Encounter (Signed)
I am keeping that form for him until he is released to drive.  There is no point in filling it out right now when he is still restricted

## 2017-07-28 NOTE — Telephone Encounter (Signed)
[facility], aware.

## 2017-07-29 ENCOUNTER — Ambulatory Visit (INDEPENDENT_AMBULATORY_CARE_PROVIDER_SITE_OTHER): Payer: Medicaid Other | Admitting: *Deleted

## 2017-07-29 ENCOUNTER — Encounter (HOSPITAL_COMMUNITY)
Admission: RE | Admit: 2017-07-29 | Discharge: 2017-07-29 | Disposition: A | Discharge status: Home / Self Care | Payer: Medicaid Other | Source: Ambulatory Visit | Attending: Cardiology | Admitting: Cardiology

## 2017-07-29 DIAGNOSIS — Z955 Presence of coronary angioplasty implant and graft: Secondary | ICD-10-CM

## 2017-07-29 DIAGNOSIS — I214 Non-ST elevation (NSTEMI) myocardial infarction: Secondary | ICD-10-CM

## 2017-07-29 DIAGNOSIS — R55 Syncope and collapse: Secondary | ICD-10-CM

## 2017-07-29 NOTE — Progress Notes (Signed)
Daily Session Note  Patient Details  Name: Randy Hebert MRN: [id number] Date of Birth: [birth year] Referring Provider:     CARDIAC REHAB PHASE II ORIENTATION from 06/22/2017 in [place]  Referring Provider  Dr. [name]      Encounter Date: 07/29/2017  Check In: Session Check In - 07/29/17 0828      Check-In   Location  [facility]    Staff Present  Suzanne Boron, BS, EP, Exercise Physiologist;Debra Wynetta Emery, RN, BSN    Supervising physician immediately available to respond to emergencies  See telemetry face sheet for immediately available MD    Medication changes reported      No    Fall or balance concerns reported     No    Warm-up and Cool-down  Performed as group-led instruction    Resistance Training Performed  Yes    VAD Patient?  No      Pain Assessment   Currently in Pain?  No/denies    Pain Score  0-No pain    Multiple Pain Sites  No       Capillary Blood Glucose: No results found for this or any previous visit (from the past 24 hour(s)).    Social History   Tobacco Use  Smoking Status Current Every Day Smoker  . Packs/day: 1.00  . Years: 46.00  . Pack years: 46.00  . Types: Cigarettes  . Start date: 06/07/1979  Smokeless Tobacco Former Systems developer  . Types: Chew    Goals Met:  Independence with exercise equipment Exercise tolerated well No report of cardiac concerns or symptoms Strength training completed today  Goals Unmet:  Not Applicable  Comments: Check out 915   Dr. Kate Sable is Medical Director for [place] and Pulmonary Rehab.

## 2017-07-29 NOTE — Progress Notes (Signed)
Cardiac Individual Treatment Plan  Patient Details  Name: Randy Hebert MRN: [id number] Date of Birth: 1966-04-05 Referring Provider:     [place] from 06/22/2017 in [place]  Referring Provider  Dr. [name]      Initial Encounter Date:    CARDIAC REHAB PHASE II ORIENTATION from 06/22/2017 in [place]  Date  06/22/17  Referring Provider  Dr. [name]      Visit Diagnosis: NSTEMI (non-ST elevated myocardial infarction) [facility])  Status post coronary artery stent placement  Patient's Home Medications on Admission:  Current Outpatient Medications:  .  acetaminophen (TYLENOL) 325 MG tablet, Take 650 mg by mouth every 6 (six) hours as needed for mild pain., Disp: , Rfl:  .  albuterol (PROVENTIL HFA;VENTOLIN HFA) 108 (90 Base) MCG/ACT inhaler, Inhale 2 puffs into the lungs every 6 (six) hours as needed for wheezing or shortness of breath., Disp: 1 Inhaler, Rfl: 4 .  albuterol (PROVENTIL) (2.5 MG/3ML) 0.083% nebulizer solution, Take 3 mLs (2.5 mg total) by nebulization every 6 (six) hours as needed for wheezing or shortness of breath., Disp: 150 mL, Rfl: 1 .  aspirin 81 MG EC tablet, Take 1 tablet (81 mg total) by mouth daily., Disp: 30 tablet, Rfl:  .  atorvastatin (LIPITOR) 80 MG tablet, Take 80 mg by mouth daily., Disp: , Rfl:  .  Carboxymeth-Glycerin-Polysorb (REFRESH OPTIVE ADVANCED) 0.5-1-0.5 % SOLN, Apply 1 drop to eye 2 (two) times daily as needed (dry eyes). , Disp: , Rfl:  .  citalopram (CELEXA) 20 MG tablet, Take 20 mg daily by mouth., Disp: , Rfl:  .  cycloSPORINE (RESTASIS) 0.05 % ophthalmic emulsion, Place 1 drop into both eyes 2 (two) times daily., Disp: , Rfl:  .  furosemide (LASIX) 40 MG tablet, Take 1 tablet (40 mg total) by mouth daily., Disp: 90 tablet, Rfl: 3 .  Magnesium 200 MG TABS, Take 1 tablet (200 mg total) by mouth daily., Disp: 30 each, Rfl: 6 .  metFORMIN (GLUCOPHAGE) 1000 MG tablet, Take  1 tablet (1,000 mg total) by mouth 2 (two) times daily with a meal., Disp: 180 tablet, Rfl: 3 .  mometasone-formoterol (DULERA) 100-5 MCG/ACT AERO, Inhale 2 puffs into the lungs 2 (two) times daily., Disp: 3 Inhaler, Rfl: 3 .  nicotine (NICODERM CQ - DOSED IN MG/24 HOURS) 21 mg/24hr patch, Place 1 patch (21 mg total) onto the skin daily. (Patient taking differently: Place 21 mg onto the skin daily. ), Disp: 28 patch, Rfl: 0 .  nitroGLYCERIN (NITROSTAT) 0.4 MG SL tablet, Place 1 tablet (0.4 mg total) under the tongue every 5 (five) minutes as needed for chest pain., Disp: 25 tablet, Rfl: 3 .  Omega-3 Fatty Acids (FISH OIL) 1200 MG CAPS, Take 1,200 mg by mouth 2 (two) times daily., Disp: , Rfl:  .  pantoprazole (PROTONIX) 40 MG tablet, Take 1 tablet (40 mg total) by mouth daily., Disp: 30 tablet, Rfl: 11 .  potassium chloride SA (K-DUR,KLOR-CON) 20 MEQ tablet, Take 1 tablet (20 mEq total) by mouth daily., Disp: 30 tablet, Rfl: 3 .  prasugrel (EFFIENT) 10 MG TABS tablet, Take 1 tablet (10 mg total) by mouth daily., Disp: 90 tablet, Rfl: 1 .  traZODone (DESYREL) 100 MG tablet, Take 100 mg by mouth at bedtime. , Disp: , Rfl:   Past Medical History: Past Medical History:  Diagnosis Date  . Allergy   . Anxiety   . Arthritis   . Asthma  hip replacement  . Back pain   . Bulging of cervical intervertebral disc   . CAD (coronary artery disease)    lateral STEMI 02/06/2015 00% D1 occlusion treated with Promus Premier 2.5 mm x 16 mm DES, 70% ramus stenosis, 40% mid RCA stenosis, 45% distal RCA stenosis, EF 45-50%  . CHF (congestive heart failure) ([place])   . COPD (chronic obstructive pulmonary disease) ([place])   . Depression   . Diabetes mellitus without complication ([place])   . 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 Dry eye   . Dyspnea   . Early satiety 09/23/2016  . GERD (gastroesophageal reflux disease)   . Headache   . Heart murmur   . Hip pain   .  Hyperlipidemia   . Hypertension   . MI (myocardial infarction) ([place])   . Myocardial infarction ([place])   . Neck pain   . Otitis media   . Pleurisy   . Sinus pause    9 sec sinus pause on telemetry after started on coreg after MI, avoid AV nodal blocking agent  . Sleep apnea   . 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 Unilateral primary osteoarthritis, right hip 07/01/2016    Tobacco Use: Social History   Tobacco Use  Smoking Status Current Every Day Smoker  . Packs/day: 1.00  . Years: 46.00  . Pack years: 46.00  . Types: Cigarettes  . Start date: 06/07/1979  Smokeless Tobacco Former Systems developer  . Types: Chew    Labs: Recent Review Flowsheet Data    Labs for ITP Cardiac and Pulmonary Rehab Latest Ref Rng & Units 05/20/2016 08/05/2016 11/11/2016 04/15/2017 04/27/2017   Cholestrol 0 - 200 mg/dL 128 132 112 134 93   LDLCALC 0 - 99 mg/dL 60 56 49 71 34   HDL >40 mg/dL 24(L) 22(L) 20(L) 24(L) 18(L)   Trlycerides <150 mg/dL 222(H) 272(H) 214(H) 193(H) 206(H)   Hemoglobin A1c <5.7 % 8.0(H) 6.3(H) 6.8(H) 7.6(H) -   PHART 7.350 - 7.450 - - - - -   PCO2ART 35.0 - 45.0 mmHg - - - - -   HCO3 20.0 - 24.0 mEq/L - - - - -   TCO2 0 - 100 mmol/L - - - - -   O2SAT % - - - - -      Capillary Blood Glucose: Lab Results  Component Value Date   GLUCAP 138 (H) 06/30/2017   GLUCAP 274 (H) 06/29/2017   GLUCAP 135 (H) 06/29/2017   GLUCAP 147 (H) 06/29/2017   GLUCAP 139 (H) 06/29/2017     Exercise Target Goals:    Exercise Program Goal: Individual exercise prescription set with THRR, safety & activity barriers. Participant demonstrates ability to understand and report RPE using BORG scale, to self-measure pulse accurately, and to acknowledge the importance of the exercise prescription.  Exercise Prescription Goal: Starting with aerobic activity 30 plus minutes a day, 3 days per week for initial exercise prescription. Provide home exercise prescription and guidelines that participant acknowledges  understanding prior to discharge.  Activity Barriers & Risk Stratification: Activity Barriers & Cardiac Risk Stratification - 06/22/17 1340      Activity Barriers & Cardiac Risk Stratification   Activity Barriers  Arthritis;Back Problems;Right Hip Replacement;Joint Problems;Deconditioning;Muscular Weakness;Shortness of Breath;Balance Concerns    Cardiac Risk Stratification  High       6 Minute Walk: 6 Minute Walk    Row Name 06/22/17 1340  6 Minute Walk   Phase  Initial     Distance  1000 feet     Distance % Change  0 %     Distance Feet Change  0 ft     Walk Time  6 minutes     # of Rest Breaks  0     MPH  1.89     METS  1.59     RPE  10     Perceived Dyspnea   13     VO2 Peak  11.3     Symptoms  No     Resting HR  77 bpm     Resting BP  124/88     Resting Oxygen Saturation   95 %     Exercise Oxygen Saturation  during 6 min walk  91 %     Max Ex. HR  103 bpm     Max Ex. BP  184/100     2 Minute Post BP  126/84        Oxygen Initial Assessment:   Oxygen Re-Evaluation:   Oxygen Discharge (Final Oxygen Re-Evaluation):   Initial Exercise Prescription: Initial Exercise Prescription - 06/22/17 1300      Date of Initial Exercise RX and Referring Provider   Date  06/22/17    Referring Provider  Dr. [name]      NuStep   Level  2    SPM  50    Minutes  20    METs  1.7      Arm Ergometer   Level  1.5    Watts  12    RPM  12    Minutes  15    METs  1.9      Prescription Details   Frequency (times per week)  3    Duration  Progress to 30 minutes of continuous aerobic without signs/symptoms of physical distress      Intensity   THRR 40-80% of Max Heartrate  113-132-150    Ratings of Perceived Exertion  11-13    Perceived Dyspnea  0-4      Progression   Progression  Continue progressive overload as per policy without signs/symptoms or physical distress.      Resistance Training   Training Prescription  Yes    Weight  1    Reps  10-15        Perform Capillary Blood Glucose checks as needed.  Exercise Prescription Changes:  Exercise Prescription Changes    Row Name 07/08/17 0800 07/15/17 1400 07/28/17 0800         Response to Exercise   Blood Pressure (Admit)  -  142/74  132/76     Blood Pressure (Exercise)  -  150/78  156/84     Blood Pressure (Exit)  -  138/74  128/72     Heart Rate (Admit)  -  91 bpm  92 bpm     Heart Rate (Exercise)  -  107 bpm  112 bpm     Heart Rate (Exit)  -  98 bpm  102 bpm     Rating of Perceived Exertion (Exercise)  -  13  10     Duration  -  Progress to 30 minutes of  aerobic without signs/symptoms of physical distress  Progress to 30 minutes of  aerobic without signs/symptoms of physical distress     Intensity  -  THRR New [phone]  THRR New [phone]       Progression  Progression  -  Continue to progress workloads to maintain intensity without signs/symptoms of physical distress.  Continue to progress workloads to maintain intensity without signs/symptoms of physical distress.       Resistance Training   Training Prescription  Yes  Yes  Yes     Weight  1  1  2      Reps  10-15  10-15  10-15       Treadmill   MPH  -  1.2  1.4     Grade  -  0  0     Minutes  -  15  15     METs  -  1.9  2       NuStep   Level  2  2  2      SPM  50  105  133     Minutes  20  20  20      METs  1.7  3.4  5.5       Arm Ergometer   Level  1.5  -  -     Watts  12  -  -     RPM  12  -  -     Minutes  15  -  -     METs  1.9  -  -       Home Exercise Plan   Plans to continue exercise at  Home (comment)  Home (comment)  Home (comment)     Frequency  Add 2 additional days to program exercise sessions.  Add 2 additional days to program exercise sessions.  Add 2 additional days to program exercise sessions.     Initial Home Exercises Provided  07/08/17  07/08/17  07/08/17        Exercise Comments:  Exercise Comments    Row Name 06/25/17 1529 07/08/17 0836 07/28/17 0816       Exercise  Comments  Patient has not yet started CR and will be progressed in time.   Patient received the take home exercise plan today 07/08/2017. THR was addressed as were safe ways to be active outside of Rehab. Patient demonstrated an understanding. Patient was encouraged to ask any future questions.   Patient is doing very well in CR. He has increased his spee on the treadmill and has increased his watts and METs very high in the Nustep machine. He also has a lower blood pressure than before. He also has gone up in weight for teh warm up to two pounds.         Exercise Goals and Review:  Exercise Goals    Row Name 06/22/17 1342             Exercise Goals   Increase Physical Activity  Yes       Intervention  Provide advice, education, support and counseling about physical activity/exercise needs.;Develop an individualized exercise prescription for aerobic and resistive training based on initial evaluation findings, risk stratification, comorbidities and participant's personal goals.       Expected Outcomes  Achievement of increased cardiorespiratory fitness and enhanced flexibility, muscular endurance and strength shown through measurements of functional capacity and personal statement of participant.       Increase Strength and Stamina  Yes       Intervention  Provide advice, education, support and counseling about physical activity/exercise needs.;Develop an individualized exercise prescription for aerobic and resistive training based on initial evaluation findings, risk stratification, comorbidities and participant's personal goals.  Expected Outcomes  Achievement of increased cardiorespiratory fitness and enhanced flexibility, muscular endurance and strength shown through measurements of functional capacity and personal statement of participant.       Able to understand and use rate of perceived exertion (RPE) scale  Yes       Intervention  Provide education and explanation on how to use RPE  scale       Expected Outcomes  Short Term: Able to use RPE daily in rehab to express subjective intensity level;Long Term:  Able to use RPE to guide intensity level when exercising independently       Able to understand and use Dyspnea scale  Yes       Intervention  Provide education and explanation on how to use Dyspnea scale       Expected Outcomes  Short Term: Able to use Dyspnea scale daily in rehab to express subjective sense of shortness of breath during exertion;Long Term: Able to use Dyspnea scale to guide intensity level when exercising independently       Knowledge and understanding of Target Heart Rate Range (THRR)  Yes       Intervention  Provide education and explanation of THRR including how the numbers were predicted and where they are located for reference       Expected Outcomes  Short Term: Able to state/look up THRR       Able to check pulse independently  Yes       Intervention  Provide education and demonstration on how to check pulse in carotid and radial arteries.;Review the importance of being able to check your own pulse for safety during independent exercise       Expected Outcomes  Short Term: Able to explain why pulse checking is important during independent exercise;Long Term: Able to check pulse independently and accurately       Understanding of Exercise Prescription  Yes       Intervention  Provide education, explanation, and written materials on patient's individual exercise prescription       Expected Outcomes  Short Term: Able to explain program exercise prescription;Long Term: Able to explain home exercise prescription to exercise independently          Exercise Goals Re-Evaluation : Exercise Goals Re-Evaluation    Row Name 07/28/17 0814             Exercise Goal Re-Evaluation   Exercise Goals Review  Increase Physical Activity;Increase Strength and Stamina;Knowledge and understanding of Target Heart Rate Range (THRR)       Comments  Patient is doing  very well in CR. He has increased his spee on the treadmill and has increased his watts and METs very high in the Nustep machine. He also has a lower blood pressure than before. He also has gone up in weight for teh warm up to two pounds.        Expected Outcomes  Patient wishes to breathe better and to lose weight.            Discharge Exercise Prescription (Final Exercise Prescription Changes): Exercise Prescription Changes - 07/28/17 0800      Response to Exercise   Blood Pressure (Admit)  132/76    Blood Pressure (Exercise)  156/84    Blood Pressure (Exit)  128/72    Heart Rate (Admit)  92 bpm    Heart Rate (Exercise)  112 bpm    Heart Rate (Exit)  102 bpm    Rating of Perceived Exertion (Exercise)  10    Duration  Progress to 30 minutes of  aerobic without signs/symptoms of physical distress    Intensity  THRR New [phone]      Progression   Progression  Continue to progress workloads to maintain intensity without signs/symptoms of physical distress.      Resistance Training   Training Prescription  Yes    Weight  2    Reps  10-15      Treadmill   MPH  1.4    Grade  0    Minutes  15    METs  2      NuStep   Level  2    SPM  133    Minutes  20    METs  5.5      Home Exercise Plan   Plans to continue exercise at  Home (comment)    Frequency  Add 2 additional days to program exercise sessions.    Initial Home Exercises Provided  07/08/17       Nutrition:  Target Goals: Understanding of nutrition guidelines, daily intake of sodium 1500mg , cholesterol 200mg , calories 30% from fat and 7% or less from saturated fats, daily to have 5 or more servings of fruits and vegetables.  Biometrics: Pre Biometrics - 06/22/17 1343      Pre Biometrics   Height  5\' 6"  (1.676 m)    Weight  230 lb 9.6 oz (104.6 kg)    Waist Circumference  50 inches    Hip Circumference  43 inches    Waist to Hip Ratio  1.16 %    BMI (Calculated)  37.24    Triceps Skinfold  6 mm    %  Body Fat  32 %    Grip Strength  62.33 kg    Flexibility  0 in    Single Leg Stand  15 seconds        Nutrition Therapy Plan and Nutrition Goals: Nutrition Therapy & Goals - 07/29/17 1455      Nutrition Therapy   RD appointment defered  Yes       Nutrition Discharge: Rate Your Plate Scores: Nutrition Assessments - 06/22/17 1526      MEDFICTS Scores   Pre Score  64       Nutrition Goals Re-Evaluation:   Nutrition Goals Discharge (Final Nutrition Goals Re-Evaluation):   Psychosocial: Target Goals: Acknowledge presence or absence of significant depression and/or stress, maximize coping skills, provide positive support system. Participant is able to verbalize types and ability to use techniques and skills needed for reducing stress and depression.  Initial Review & Psychosocial Screening: Initial Psych Review & Screening - 06/22/17 1530      Initial Review   Current issues with  Current Depression;Current Anxiety/Panic      Family Dynamics   Good Support System?  Yes      Barriers   Psychosocial barriers to participate in program  There are no identifiable barriers or psychosocial needs.;The patient should benefit from training in stress management and relaxation.      Screening Interventions   Interventions  Encouraged to exercise;Provide feedback about the scores to participant       Quality of Life Scores: Quality of Life - 06/22/17 1344      Quality of Life Scores   Health/Function Pre  9 %    Socioeconomic Pre  13 %    Psych/Spiritual Pre  17 %    Family Pre  16.8 %  GLOBAL Pre  12.64 %       PHQ-9: Recent Review Flowsheet Data    Depression screen [facility] 2/9 06/22/2017 06/17/2017 04/17/2017 04/15/2017   Decreased Interest 2 0 0 0   Down, Depressed, Hopeless 2 0 0 0   PHQ - 2 Score 4 0 0 0   Altered sleeping 2 - - -   Tired, decreased energy 2 - - -   Change in appetite 3 - - -   Feeling bad or failure about yourself  3 - - -   Trouble  concentrating 1 - - -   Moving slowly or fidgety/restless 0 - - -   Suicidal thoughts 0 - - -   PHQ-9 Score 15 - - -   Difficult doing work/chores Extremely dIfficult - - -     Interpretation of Total Score  Total Score Depression Severity:  1-4 = Minimal depression, 5-9 = Mild depression, 10-14 = Moderate depression, 15-19 = Moderately severe depression, 20-27 = Severe depression   Psychosocial Evaluation and Intervention: Psychosocial Evaluation - 06/22/17 1531      Psychosocial Evaluation & Interventions   Interventions  Relaxation education;Encouraged to exercise with the program and follow exercise prescription    Continue Psychosocial Services   Follow up required by staff       Psychosocial Re-Evaluation: Psychosocial Re-Evaluation    [place]oh[place] Name 07/29/17 1455             Psychosocial Re-Evaluation   Current issues with  Current Depression;Current Anxiety/Panic       Comments  Patient's initial QOL was 12.64 and his PHQ-9 was 15. He is seeing a Social worker and is being treated with trazadone and alprazolam.        Expected Outcomes  Patient's depression and anxiety will be managed at discharge with no additional psychosocial barriers identified.        Interventions  Stress management education;Encouraged to attend Cardiac Rehabilitation for the exercise       Continue Psychosocial Services   Follow up required by staff          Psychosocial Discharge (Final Psychosocial Re-Evaluation): Psychosocial Re-Evaluation - 07/29/17 1455      Psychosocial Re-Evaluation   Current issues with  Current Depression;Current Anxiety/Panic    Comments  Patient's initial QOL was 12.64 and his PHQ-9 was 15. He is seeing a Social worker and is being treated with trazadone and alprazolam.     Expected Outcomes  Patient's depression and anxiety will be managed at discharge with no additional psychosocial barriers identified.     Interventions  Stress management education;Encouraged to attend  Cardiac Rehabilitation for the exercise    Continue Psychosocial Services   Follow up required by staff       Vocational Rehabilitation: Provide vocational rehab assistance to qualifying candidates.   Vocational Rehab Evaluation & Intervention: Vocational Rehab - 06/22/17 1524      Initial Vocational Rehab Evaluation & Intervention   Assessment shows need for Vocational Rehabilitation  No       Education: Education Goals: Education classes will be provided on a weekly basis, covering required topics. Participant will state understanding/return demonstration of topics presented.  Learning Barriers/Preferences: Learning Barriers/Preferences - 06/22/17 1520      Learning Barriers/Preferences   Learning Barriers  None    Learning Preferences  Skilled Demonstration;Individual Instruction;Group Instruction;Video;Written Material;Pictoral       Education Topics: Hypertension, Hypertension Reduction -Define heart disease and high blood pressure. Discus how high blood pressure affects  the body and ways to reduce high blood pressure.   CARDIAC REHAB PHASE II EXERCISE from 07/29/2017 in [place]  Date  07/08/17  Educator  [place]  Instruction Review Code  2- Demonstrated Understanding      Exercise and Your Heart -Discuss why it is important to exercise, the FITT principles of exercise, normal and abnormal responses to exercise, and how to exercise safely.   CARDIAC REHAB PHASE II EXERCISE from 07/29/2017 in [place]  Date  07/15/17  Educator  DC  Instruction Review Code  2- Demonstrated Understanding      Angina -Discuss definition of angina, causes of angina, treatment of angina, and how to decrease risk of having angina.   CARDIAC REHAB PHASE II EXERCISE from 07/29/2017 in [place]  Date  07/22/17  Educator  D. Coad  Instruction Review Code  2- Demonstrated Understanding      Cardiac  Medications -Review what the following cardiac medications are used for, how they affect the body, and side effects that may occur when taking the medications.  Medications include Aspirin, Beta blockers, calcium channel blockers, ACE Inhibitors, angiotensin receptor blockers, diuretics, digoxin, and antihyperlipidemics.   CARDIAC REHAB PHASE II EXERCISE from 07/29/2017 in [place]  Date  07/29/17  Educator  DJ  Instruction Review Code  2- Demonstrated Understanding      Congestive Heart Failure -Discuss the definition of CHF, how to live with CHF, the signs and symptoms of CHF, and how keep track of weight and sodium intake.   Heart Disease and Intimacy -Discus the effect sexual activity has on the heart, how changes occur during intimacy as we age, and safety during sexual activity.   Smoking Cessation / COPD -Discuss different methods to quit smoking, the health benefits of quitting smoking, and the definition of COPD.   Nutrition I: Fats -Discuss the types of cholesterol, what cholesterol does to the heart, and how cholesterol levels can be controlled.   Nutrition II: Labels -Discuss the different components of food labels and how to read food label   Heart Parts and Heart Disease -Discuss the anatomy of the heart, the pathway of blood circulation through the heart, and these are affected by heart disease.   Stress I: Signs and Symptoms -Discuss the causes of stress, how stress may lead to anxiety and depression, and ways to limit stress.   Stress II: Relaxation -Discuss different types of relaxation techniques to limit stress.   Warning Signs of Stroke / TIA -Discuss definition of a stroke, what the signs and symptoms are of a stroke, and how to identify when someone is having stroke.   Knowledge Questionnaire Score: Knowledge Questionnaire Score - 06/22/17 1523      Knowledge Questionnaire Score   Pre Score  23/24       Core  Components/Risk Factors/Patient Goals at Admission: Personal Goals and Risk Factors at Admission - 06/22/17 1527      Core Components/Risk Factors/Patient Goals on Admission    Weight Management  Yes    Admit Weight  202 lb (91.6 kg)    Goal Weight: Short Term  177 lb (80.3 kg)    Goal Weight: Long Term  152 lb (68.9 kg)    Expected Outcomes  Short Term: Continue to assess and modify interventions until short term weight is achieved;Long Term: Adherence to nutrition and physical activity/exercise program aimed toward attainment of established weight goal    Tobacco Cessation  Yes    Intervention  Assist the participant in steps to quit. Provide individualized education and counseling about committing to Tobacco Cessation, relapse prevention, and pharmacological support that can be provided by physician.    Expected Outcomes  Short Term: Will quit all tobacco product use, adhering to prevention of relapse plan.;Long Term: Complete abstinence from all tobacco products for at least 12 months from quit date.    Improve shortness of breath with ADL's  Yes    Intervention  Provide education, individualized exercise plan and daily activity instruction to help decrease symptoms of SOB with activities of daily living.    Expected Outcomes  Short Term: Achieves a reduction of symptoms when performing activities of daily living.    Stress  Yes    Intervention  Offer individual and/or small group education and counseling on adjustment to heart disease, stress management and health-related lifestyle change. Teach and support self-help strategies.    Expected Outcomes  Short Term: Participant demonstrates changes in health-related behavior, relaxation and other stress management skills, ability to obtain effective social support, and compliance with psychotropic medications if prescribed.    Personal Goal Other  Yes    Personal Goal  Breathe better, Lose 50 lbs    Intervention  Attend program 3 x week and  supplement 2 x week exercise at home    Expected Outcomes  Meet personal goals       Core Components/Risk Factors/Patient Goals Review:  Goals and Risk Factor Review    Row Name 07/29/17 1449             Core Components/Risk Factors/Patient Goals Review   Personal Goals Review  Weight Management/Obesity;Diabetes;Tobacco Cessation Breathe better; lose 50 lbs long term.        Review  Patient has completed 11 sessions gaining 1 lb. He is doing well in the program with progression. He continues to smoke but says he is trying to cut back. He is trying to alter his diet by cutting out soda's and cutting down on candy and sweets. His last A1C was 7.6 on 04/15/17. He says he is breathing better noting less SOB with activity. He says he feels stronger and is walking more at home. He has a stationary bike and hopes to work up to riding it. Will continue to monitor.        Expected Outcomes  Patient will continue to attend sessions and complete the program and continue to meet his personal goals.           Core Components/Risk Factors/Patient Goals at Discharge (Final Review):  Goals and Risk Factor Review - 07/29/17 1449      Core Components/Risk Factors/Patient Goals Review   Personal Goals Review  Weight Management/Obesity;Diabetes;Tobacco Cessation Breathe better; lose 50 lbs long term.     Review  Patient has completed 11 sessions gaining 1 lb. He is doing well in the program with progression. He continues to smoke but says he is trying to cut back. He is trying to alter his diet by cutting out soda's and cutting down on candy and sweets. His last A1C was 7.6 on 04/15/17. He says he is breathing better noting less SOB with activity. He says he feels stronger and is walking more at home. He has a stationary bike and hopes to work up to riding it. Will continue to monitor.     Expected Outcomes  Patient will continue to attend sessions and complete the program and continue to meet his personal  goals.        ITP Comments: ITP Comments    Row Name 06/29/17 0755           ITP Comments  Patient new to program. He is currently being evaluated for a syncope episode. Plans to start 07/06/17. Will continue to monitor.           Comments: ITP 30 Day REVIEW Patient doing well in the program. Will continue to monitor for progress.

## 2017-07-30 NOTE — Progress Notes (Signed)
[facility] Summary Report / Loop Recorder 

## 2017-07-31 ENCOUNTER — Telehealth: Payer: Self-pay | Admitting: Family Medicine

## 2017-07-31 ENCOUNTER — Encounter (HOSPITAL_COMMUNITY)
Admission: RE | Admit: 2017-07-31 | Discharge: 2017-07-31 | Disposition: A | Discharge status: Home / Self Care | Payer: Medicaid Other | Source: Ambulatory Visit | Attending: Cardiology | Admitting: Cardiology

## 2017-07-31 DIAGNOSIS — I214 Non-ST elevation (NSTEMI) myocardial infarction: Secondary | ICD-10-CM | POA: Diagnosis not present

## 2017-07-31 DIAGNOSIS — Z955 Presence of coronary angioplasty implant and graft: Secondary | ICD-10-CM

## 2017-07-31 MED ORDER — GLUCOSE BLOOD VI STRP: 1.0000 | ORAL_STRIP | Freq: Two times a day (BID) | 12 refills | Status: DC

## 2017-07-31 NOTE — Telephone Encounter (Signed)
Came in requesting diabetic test strips. [employer]

## 2017-07-31 NOTE — Telephone Encounter (Signed)
Done

## 2017-07-31 NOTE — Progress Notes (Signed)
Daily Session Note  Patient Details  Name: Randy Hebert MRN: [id number] Date of Birth: 03-19-1966 Referring Provider:     CARDIAC REHAB PHASE II ORIENTATION from 06/22/2017 in [place]  Referring Provider  Dr. [name]      Encounter Date: 07/31/2017  Check In: Session Check In - 07/31/17 0825      Check-In   Location  [facility]    Staff Present  Suzanne Boron, BS, EP, Exercise Physiologist;Debra Wynetta Emery, RN, BSN    Supervising physician immediately available to respond to emergencies  See telemetry face sheet for immediately available MD    Medication changes reported      No    Fall or balance concerns reported     No    Warm-up and Cool-down  Performed as group-led instruction    Resistance Training Performed  Yes    VAD Patient?  No      Pain Assessment   Currently in Pain?  No/denies    Pain Score  0-No pain    Multiple Pain Sites  No       Capillary Blood Glucose: No results found for this or any previous visit (from the past 24 hour(s)).    Social History   Tobacco Use  Smoking Status Current Every Day Smoker  . Packs/day: 1.00  . Years: 46.00  . Pack years: 46.00  . Types: Cigarettes  . Start date: 06/07/1979  Smokeless Tobacco Former Systems developer  . Types: Chew    Goals Met:  Independence with exercise equipment Exercise tolerated well No report of cardiac concerns or symptoms Strength training completed today  Goals Unmet:  Not Applicable  Comments: Check out 915   Dr. Kate Sable is Medical Director for [place] and [facility].

## 2017-08-03 ENCOUNTER — Encounter: Payer: Medicaid Other | Admitting: Internal Medicine

## 2017-08-03 ENCOUNTER — Encounter (HOSPITAL_COMMUNITY)
Admission: RE | Admit: 2017-08-03 | Discharge: 2017-08-03 | Disposition: A | Discharge status: Home / Self Care | Payer: Medicaid Other | Source: Ambulatory Visit | Attending: Cardiology | Admitting: Cardiology

## 2017-08-03 DIAGNOSIS — F17210 Nicotine dependence, cigarettes, uncomplicated: Secondary | ICD-10-CM | POA: Insufficient documentation

## 2017-08-03 DIAGNOSIS — Z955 Presence of coronary angioplasty implant and graft: Secondary | ICD-10-CM

## 2017-08-03 DIAGNOSIS — I214 Non-ST elevation (NSTEMI) myocardial infarction: Secondary | ICD-10-CM | POA: Diagnosis present

## 2017-08-03 NOTE — Progress Notes (Addendum)
Daily Session Note  Patient Details  Name: Randy Hebert MRN: [id number] Date of Birth: Mar 05, 1966 Referring Provider:     CARDIAC REHAB PHASE II ORIENTATION from 06/22/2017 in [place]  Referring Provider  Dr. [name]      Encounter Date: 08/03/2017  Check In: Session Check In - 08/03/17 0828      Check-In   Location  [facility]    Staff Present  Suzanne Boron, BS, EP, Exercise Physiologist;Debra Wynetta Emery, RN, BSN    Supervising physician immediately available to respond to emergencies  See telemetry face sheet for immediately available MD    Medication changes reported      No    Fall or balance concerns reported     No    Warm-up and Cool-down  Performed as group-led instruction    Resistance Training Performed  Yes    VAD Patient?  No      Pain Assessment   Currently in Pain?  No/denies    Pain Score  0-No pain    Multiple Pain Sites  No       Capillary Blood Glucose: No results found for this or any previous visit (from the past 24 hour(s)).    Social History   Tobacco Use  Smoking Status Current Every Day Smoker  . Packs/day: 1.00  . Years: 46.00  . Pack years: 46.00  . Types: Cigarettes  . Start date: 06/07/1979  Smokeless Tobacco Former Systems developer  . Types: Chew    Goals Met:  Independence with exercise equipment Exercise tolerated well No report of cardiac concerns or symptoms Strength training completed today  Goals Unmet:  Not Applicable  Comments: Check out 915. Chronic left hip pain 4/10   Dr. Kate Sable is Medical Director for [facility] and [facility].

## 2017-08-05 ENCOUNTER — Encounter (HOSPITAL_COMMUNITY)
Admission: RE | Admit: 2017-08-05 | Discharge: 2017-08-05 | Disposition: A | Discharge status: Home / Self Care | Payer: Medicaid Other | Source: Ambulatory Visit | Attending: Cardiology | Admitting: Cardiology

## 2017-08-05 DIAGNOSIS — Z955 Presence of coronary angioplasty implant and graft: Secondary | ICD-10-CM

## 2017-08-05 DIAGNOSIS — I214 Non-ST elevation (NSTEMI) myocardial infarction: Secondary | ICD-10-CM | POA: Diagnosis not present

## 2017-08-05 NOTE — Progress Notes (Signed)
Daily Session Note  Patient Details  Name: Randy Hebert MRN: [id number] Date of Birth: 1965/10/11 Referring Provider:     CARDIAC REHAB PHASE II ORIENTATION from 06/22/2017 in [place]  Referring Provider  Dr. [name]      Encounter Date: 08/05/2017  Check In: Session Check In - 08/05/17 0815      Check-In   Location  [facility]    Staff Present  Suzanne Boron, BS, EP, Exercise Physiologist;Debra Wynetta Emery, RN, BSN    Supervising physician immediately available to respond to emergencies  See telemetry face sheet for immediately available MD    Medication changes reported      No    Fall or balance concerns reported     No    Warm-up and Cool-down  Performed as group-led instruction    Resistance Training Performed  Yes    VAD Patient?  No      Pain Assessment   Currently in Pain?  No/denies    Pain Score  0-No pain    Multiple Pain Sites  No       Capillary Blood Glucose: No results found for this or any previous visit (from the past 24 hour(s)).    Social History   Tobacco Use  Smoking Status Current Every Day Smoker  . Packs/day: 1.00  . Years: 46.00  . Pack years: 46.00  . Types: Cigarettes  . Start date: 06/07/1979  Smokeless Tobacco Former Systems developer  . Types: Chew    Goals Met:  Independence with exercise equipment Exercise tolerated well No report of cardiac concerns or symptoms Strength training completed today  Goals Unmet:  Not Applicable  Comments: Check out 915   Dr. Kate Sable is Medical Director for [facility] and [facility].

## 2017-08-07 ENCOUNTER — Encounter (HOSPITAL_COMMUNITY)
Admission: RE | Admit: 2017-08-07 | Discharge: 2017-08-07 | Disposition: A | Discharge status: Home / Self Care | Payer: Medicaid Other | Source: Ambulatory Visit | Attending: Cardiology | Admitting: Cardiology

## 2017-08-07 DIAGNOSIS — I214 Non-ST elevation (NSTEMI) myocardial infarction: Secondary | ICD-10-CM | POA: Diagnosis not present

## 2017-08-07 NOTE — Progress Notes (Signed)
Daily Session Note  Patient Details  Name: Randy Hebert MRN: [id number] Date of Birth: [birth year] Referring Provider:     CARDIAC REHAB PHASE II ORIENTATION from 06/22/2017 in [place]  Referring Provider  Dr. [name]      Encounter Date: 08/07/2017  Check In: Session Check In - 08/07/17 0911      Check-In   Location  [facility]    Staff Present  Russella Dar, MS, EP, [facility], Exercise Physiologist;Debra Wynetta Emery, RN, BSN    Supervising physician immediately available to respond to emergencies  See telemetry face sheet for immediately available MD    Medication changes reported      No    Fall or balance concerns reported     No    Tobacco Cessation  Use Decreased    Warm-up and Cool-down  Performed as group-led instruction    Resistance Training Performed  Yes    VAD Patient?  No      Pain Assessment   Currently in Pain?  No/denies    Multiple Pain Sites  No       Capillary Blood Glucose: No results found for this or any previous visit (from the past 24 hour(s)).    Social History   Tobacco Use  Smoking Status Current Every Day Smoker  . Packs/day: 1.00  . Years: 46.00  . Pack years: 46.00  . Types: Cigarettes  . Start date: 06/07/1979  Smokeless Tobacco Former Systems developer  . Types: Chew    Goals Met:  Independence with exercise equipment Exercise tolerated well No report of cardiac concerns or symptoms Strength training completed today  Goals Unmet:  Not Applicable  Comments: Check out: 0915   Dr. Kate Sable is Medical Director for [facility] and [facility].

## 2017-08-10 ENCOUNTER — Encounter (HOSPITAL_COMMUNITY): Payer: Medicaid Other

## 2017-08-11 ENCOUNTER — Ambulatory Visit: Payer: Medicaid Other | Admitting: Adult Health

## 2017-08-12 ENCOUNTER — Encounter (HOSPITAL_COMMUNITY): Payer: Medicaid Other

## 2017-08-13 ENCOUNTER — Other Ambulatory Visit: Payer: Self-pay | Admitting: Family Medicine

## 2017-08-13 LAB — CUP PACEART REMOTE DEVICE CHECK
Implantable Pulse Generator Implant Date: 20181029
MDC IDC SESS DTM: 20181128203835

## 2017-08-13 NOTE — Telephone Encounter (Signed)
Seen 11 9 18

## 2017-08-14 ENCOUNTER — Encounter (HOSPITAL_COMMUNITY): Payer: Medicaid Other

## 2017-08-14 LAB — CUP PACEART INCLINIC DEVICE CHECK
Implantable Pulse Generator Implant Date: 20181029
MDC IDC SESS DTM: 20181114171936

## 2017-08-17 ENCOUNTER — Ambulatory Visit: Payer: Medicaid Other | Admitting: "Endocrinology

## 2017-08-17 ENCOUNTER — Encounter: Payer: Self-pay | Admitting: "Endocrinology

## 2017-08-17 ENCOUNTER — Encounter (HOSPITAL_COMMUNITY)
Admission: RE | Admit: 2017-08-17 | Discharge: 2017-08-17 | Disposition: A | Discharge status: Home / Self Care | Payer: Medicaid Other | Source: Ambulatory Visit | Attending: Cardiology | Admitting: Cardiology

## 2017-08-17 VITALS — BP 147/93 | HR 80 | Ht 66.0 in | Wt 227.0 lb

## 2017-08-17 DIAGNOSIS — E1159 Type 2 diabetes mellitus with other circulatory complications: Secondary | ICD-10-CM

## 2017-08-17 DIAGNOSIS — E782 Mixed hyperlipidemia: Secondary | ICD-10-CM

## 2017-08-17 DIAGNOSIS — Z6836 Body mass index (BMI) 36.0-36.9, adult: Secondary | ICD-10-CM | POA: Diagnosis not present

## 2017-08-17 DIAGNOSIS — Z955 Presence of coronary angioplasty implant and graft: Secondary | ICD-10-CM

## 2017-08-17 DIAGNOSIS — I214 Non-ST elevation (NSTEMI) myocardial infarction: Secondary | ICD-10-CM | POA: Diagnosis not present

## 2017-08-17 NOTE — Progress Notes (Signed)
Consult Note       08/17/2017, 5:35 PM   Subjective:    Patient ID: Randy Hebert, male    DOB: 07-15-1966.  Randy Hebert is being seen in consultation for management of currently uncontrolled symptomatic diabetes requested by  Randy Everts, Randy Hebert.   Past Medical History:  Diagnosis Date  . Allergy   . Anxiety   . Arthritis   . Asthma    hip replacement  . Back pain   . Bulging of cervical intervertebral disc   . CAD (coronary artery disease)    lateral STEMI 02/06/2015 00% D1 occlusion treated with Promus Premier 2.5 mm x 16 mm DES, 70% ramus stenosis, 40% mid RCA stenosis, 45% distal RCA stenosis, EF 45-50%  . CHF (congestive heart failure) ([place])   . COPD (chronic obstructive pulmonary disease) ([place])   . Depression   . Diabetes mellitus without complication ([place])   . 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 Dry eye   . Dyspnea   . Early satiety 09/23/2016  . GERD (gastroesophageal reflux disease)   . Headache   . Heart murmur   . Hip pain   . Hyperlipidemia   . Hypertension   . MI (myocardial infarction) ([place])   . Myocardial infarction ([place])   . Neck pain   . Otitis media   . Pleurisy   . Sinus pause    9 sec sinus pause on telemetry after started on coreg after MI, avoid AV nodal blocking agent  . Sleep apnea   . 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 Unilateral primary osteoarthritis, right hip 07/01/2016   Past Surgical History:  Procedure Laterality Date  . BIOPSY  10/09/2016   Procedure: BIOPSY;  Surgeon: Daneil Dolin, Randy Hebert;  [facility];  Service: Endoscopy;;  . CARDIAC CATHETERIZATION N/A 02/06/2015   Procedure: Left Heart Cath and Coronary Angiography;  Surgeon: Leonie Man, Randy Hebert;  Location: [facility];  Service: Cardiovascular;  Laterality: N/A;  . CARDIAC CATHETERIZATION N/A 02/06/2015   Procedure: Coronary  Stent Intervention;  Surgeon: Leonie Man, Randy Hebert;  Location: [facility];  Service: Cardiovascular;  Laterality: N/A;  . COLONOSCOPY WITH PROPOFOL N/A 10/09/2016   Procedure: COLONOSCOPY WITH PROPOFOL;  Surgeon: Daneil Dolin, Randy Hebert;  [facility];  Service: Endoscopy;  Laterality: N/A;  12:00 pm  . COLONOSCOPY WITH PROPOFOL N/A 11/24/2016   Procedure: COLONOSCOPY WITH PROPOFOL;  Surgeon: Daneil Dolin, Randy Hebert;  [facility];  Service: Endoscopy;  Laterality: N/A;  200  . CORONARY ANGIOPLASTY WITH STENT PLACEMENT  01/2015  . CORONARY STENT INTERVENTION N/A 04/27/2017   Procedure: CORONARY STENT INTERVENTION;  Surgeon: Nelva Bush, Randy Hebert;  Location: [facility];  Service: Cardiovascular;  Laterality: N/A;  . ELECTROPHYSIOLOGY STUDY N/A 06/29/2017   Procedure: ELECTROPHYSIOLOGY STUDY;  Surgeon: Evans Lance, Randy Hebert;  Location: [place] [facility];  Service: Cardiovascular;  Laterality: N/A;  . ESOPHAGOGASTRODUODENOSCOPY (EGD) WITH PROPOFOL N/A 10/09/2016   Procedure: ESOPHAGOGASTRODUODENOSCOPY (EGD) WITH PROPOFOL;  Surgeon: Cristopher Estimable  Rourk, Randy Hebert;  [facility];  Service: Endoscopy;  Laterality: N/A;  . INCISION / DRAINAGE HAND / FINGER    . LEFT HEART CATH AND CORONARY ANGIOGRAPHY N/A 04/27/2017   Procedure: LEFT HEART CATH AND CORONARY ANGIOGRAPHY;  Surgeon: Nelva Bush, Randy Hebert;  Location: [facility];  Service: Cardiovascular;  Laterality: N/A;  . LOOP RECORDER INSERTION  06/29/2017   Procedure: Loop Recorder Insertion;  Surgeon: Evans Lance, Randy Hebert;  Location: [facility];  Service: Cardiovascular;;  . POLYPECTOMY  11/24/2016   Procedure: POLYPECTOMY;  Surgeon: Daneil Dolin, Randy Hebert;  [facility];  Service: Endoscopy;;  descending and sigmoid  . TOTAL HIP ARTHROPLASTY Right 07/01/2016  . TOTAL HIP ARTHROPLASTY Right 07/01/2016   Procedure: RIGHT TOTAL HIP ARTHROPLASTY ANTERIOR APPROACH;  Surgeon: Mcarthur Rossetti, Randy Hebert;  Location: [facility];  Service: Orthopedics;  Laterality: Right;   Social History   Socioeconomic History  . Marital status: Significant Other    Spouse name: Randy Hebert  . Number of children: 3  . Years of education: 77  . Highest education level: None  Social Needs  . Financial resource strain: None  . Food insecurity - worry: None  . Food insecurity - inability: None  . Transportation needs - medical: None  . Transportation needs - non-medical: None  Occupational History  . Occupation: unemployed    Comment: Nature conservation officer    Comment: disability pending  Tobacco Use  . Smoking status: Current Every Day Smoker    Packs/day: 1.00    Years: 46.00    Pack years: 46.00    Types: Cigarettes    Start date: 06/07/1979  . Smokeless tobacco: Former Systems developer    Types: Chew  Substance and Sexual Activity  . Alcohol use: Yes    Alcohol/week: 6.6 - 7.2 oz    Types: 5 - 6 Shots of liquor, 6 Cans of beer per week    Comment: alcoholic, now drinks intermittently  . Drug use: No    Comment: history of drug use- marijuana, cocaine- quit 2014  . Sexual activity: Yes    Partners: Female    Birth control/protection: None  Other Topics Concern  . None  Social History Narrative   Lives with girlfriend Randy Hebert   Leisure - TV   Outpatient Encounter Medications as of 08/17/2017  Medication Sig  . acetaminophen (TYLENOL) 325 MG tablet Take 650 mg by mouth every 6 (six) hours as needed for mild pain.  Marland Kitchen albuterol (PROVENTIL) (2.5 MG/3ML) 0.083% nebulizer solution Take 3 mLs (2.5 mg total) by nebulization every 6 (six) hours as needed for wheezing or shortness of breath.  Marland Kitchen aspirin 81 MG EC tablet Take 1 tablet (81 mg total) by mouth daily.  Marland Kitchen atorvastatin (LIPITOR) 80 MG tablet Take 80 mg by mouth daily.  . Carboxymeth-Glycerin-Polysorb (REFRESH OPTIVE ADVANCED) 0.5-1-0.5 % SOLN Apply 1 drop to eye 2 (two) times daily as needed (dry eyes).   . citalopram (CELEXA) 20 MG tablet Take 20 mg daily by mouth.  .  cycloSPORINE (RESTASIS) 0.05 % ophthalmic emulsion Place 1 drop into both eyes 2 (two) times daily.  . furosemide (LASIX) 40 MG tablet Take 1 tablet (40 mg total) by mouth daily.  Marland Kitchen glucose blood (IGLUCOSE TEST STRIPS) test strip 1 each by Other route 2 (two) times daily. Use as instructed  . Magnesium 200 MG TABS Take 1 tablet (200 mg total) by mouth daily.  . metFORMIN (GLUCOPHAGE) 1000 MG tablet Take 1  tablet (1,000 mg total) by mouth 2 (two) times daily with a meal.  . mometasone-formoterol (DULERA) 100-5 MCG/ACT AERO Inhale 2 puffs into the lungs 2 (two) times daily.  . nicotine (NICODERM CQ - DOSED IN MG/24 HOURS) 21 mg/24hr patch Place 1 patch (21 mg total) onto the skin daily. (Patient taking differently: Place 21 mg onto the skin daily. )  . nitroGLYCERIN (NITROSTAT) 0.4 MG SL tablet Place 1 tablet (0.4 mg total) under the tongue every 5 (five) minutes as needed for chest pain.  . Omega-3 Fatty Acids (FISH OIL) 1200 MG CAPS Take 1,200 mg by mouth 2 (two) times daily.  . pantoprazole (PROTONIX) 40 MG tablet Take 1 tablet (40 mg total) by mouth daily.  . potassium chloride SA (K-DUR,KLOR-CON) 20 MEQ tablet Take 1 tablet (20 mEq total) by mouth daily.  . prasugrel (EFFIENT) 10 MG TABS tablet Take 1 tablet (10 mg total) by mouth daily.  Marland Kitchen PROVENTIL HFA 108 (90 Base) MCG/ACT inhaler INHALE 2 PUFFS INTO THE LUNGS EVERY 6 HOURS AS NEEDED FOR SHORTNESS OF BREATH OR WHEEZING.  . traZODone (DESYREL) 100 MG tablet Take 100 mg by mouth at bedtime.    No facility-administered encounter medications on file as of 08/17/2017.     ALLERGIES: Allergies  Allergen Reactions  . Carvedilol Other (See Comments)    Sinus pause on telemetry >3 seconds. Longest one 9 sec. No AV nodal agent  . Lisinopril Anaphylaxis, Shortness Of Breath and Swelling    Angioedema, required intubation and mechanical ventilation  . Amoxicillin Nausea And Vomiting    Nausea only - no allergy    VACCINATION  STATUS: Immunization History  Administered Date(s) Administered  . Influenza,inj,Quad PF,6+ Mos 07/03/2016, 05/07/2017  . Pneumococcal Conjugate-13 06/17/2017  . Pneumococcal Polysaccharide-23 02/07/2015    Diabetes  He presents for his initial diabetic visit. He has type 2 diabetes mellitus. Onset time: He was diagnosed at approximate age of 52 years. His disease course has been worsening. There are no hypoglycemic associated symptoms. Pertinent negatives for hypoglycemia include no confusion, headaches, pallor or seizures. Pertinent negatives for diabetes include no chest pain, no fatigue, no polydipsia, no polyphagia, no polyuria and no weakness. There are no hypoglycemic complications. Symptoms are worsening. Diabetic complications include heart disease. Risk factors for coronary artery disease include dyslipidemia, diabetes mellitus, hypertension, male sex, family history, obesity, sedentary lifestyle and tobacco exposure. Current diabetic treatment includes oral agent (monotherapy). His weight is decreasing steadily. He is following a generally unhealthy diet. When asked about meal planning, he reported none. He has not had a previous visit with a dietitian. He never participates in exercise. (He did not bring any meter nor logs to review today. His A1c was 7.6% from 04/15/2017.) An ACE inhibitor/angiotensin II receptor blocker is contraindicated (He has documented allergy for ACE inhibitor's). He does not see a podiatrist.Eye exam is not current.  Hyperlipidemia  This is a chronic problem. The current episode started more than 1 year ago. Exacerbating diseases include diabetes and obesity. Pertinent negatives include no chest pain, myalgias or shortness of breath. Current antihyperlipidemic treatment includes statins. Risk factors for coronary artery disease include dyslipidemia, diabetes mellitus, family history, obesity, male sex, hypertension and a sedentary lifestyle.      Review of  Systems  Constitutional: Negative for chills, fatigue, fever and unexpected weight change.  HENT: Negative for dental problem, mouth sores and trouble swallowing.   Eyes: Negative for visual disturbance.  Respiratory: Negative for cough, choking, chest tightness, shortness  of breath and wheezing.   Cardiovascular: Negative for chest pain, palpitations and leg swelling.  Gastrointestinal: Negative for abdominal distention, abdominal pain, constipation, diarrhea, nausea and vomiting.  Endocrine: Negative for polydipsia, polyphagia and polyuria.  Genitourinary: Negative for dysuria, flank pain, hematuria and urgency.  Musculoskeletal: Negative for back pain, gait problem, myalgias and neck pain.  Skin: Negative for pallor, rash and wound.  Neurological: Negative for seizures, syncope, weakness, numbness and headaches.  Psychiatric/Behavioral: Negative.  Negative for confusion and dysphoric mood.    Objective:    BP (!) 147/93   Pulse 80   Ht 5\' 6"  (1.676 m)   Wt 227 lb (103 kg)   BMI 36.64 kg/m   Wt Readings from Last 3 Encounters:  08/17/17 227 lb (103 kg)  07/15/17 235 lb (106.6 kg)  07/10/17 232 lb 1.3 oz (105.3 kg)     Physical Exam  Constitutional: He is oriented to person, place, and time. He appears well-developed and well-nourished. He is cooperative. No distress.  HENT:  Head: Normocephalic and atraumatic.  Eyes: EOM are normal.  Neck: Normal range of motion. Neck supple. No tracheal deviation present. No thyromegaly present.  Cardiovascular: Normal rate, S1 normal, S2 normal and normal heart sounds. Exam reveals no gallop.  No murmur heard. Pulses:      Dorsalis pedis pulses are 1+ on the right side, and 1+ on the left side.       Posterior tibial pulses are 1+ on the right side, and 1+ on the left side.  Pulmonary/Chest: Breath sounds normal. No respiratory distress. He has no wheezes.  Abdominal: Soft. Bowel sounds are normal. He exhibits no distension. There is no  tenderness. There is no guarding and no CVA tenderness.  Musculoskeletal: He exhibits no edema.       Right shoulder: He exhibits no swelling and no deformity.  Neurological: He is alert and oriented to person, place, and time. He has normal strength and normal reflexes. No cranial nerve deficit or sensory deficit. Gait normal.  Skin: Skin is warm and dry. No rash noted. No cyanosis. Nails show no clubbing.  Psychiatric: He has a normal mood and affect. His speech is normal and behavior is normal. Judgment and thought content normal. Cognition and memory are normal.    CMP     Component Value Date/Time   NA 136 06/25/2017 1021   K 4.3 06/25/2017 1021   CL 96 (L) 06/25/2017 1021   CO2 33 (H) 06/25/2017 1021   GLUCOSE 121 06/25/2017 1021   BUN 10 06/25/2017 1021   CREATININE 0.78 06/25/2017 1021   CALCIUM 9.4 06/25/2017 1021   PROT 6.1 04/15/2017 0927   ALBUMIN 3.7 04/15/2017 0927   AST 29 04/15/2017 0927   ALT 38 04/15/2017 0927   ALKPHOS 98 04/15/2017 0927   BILITOT 0.6 04/15/2017 0927   GFRNONAA >60 06/04/2017 0648   GFRNONAA >89 04/15/2017 0927   GFRAA >60 06/04/2017 0648   GFRAA >89 04/15/2017 0927    Diabetic Labs (most recent): Lab Results  Component Value Date   HGBA1C 7.6 (H) 04/15/2017   HGBA1C 6.8 (H) 11/11/2016   HGBA1C 6.3 (H) 08/05/2016     Lipid Panel ( most recent) Lipid Panel     Component Value Date/Time   CHOL 93 04/27/2017 0505   TRIG 206 (H) 04/27/2017 0505   HDL 18 (L) 04/27/2017 0505   CHOLHDL 5.2 04/27/2017 0505   VLDL 41 (H) 04/27/2017 0505   LDLCALC 34 04/27/2017 0505  Lab Results  Component Value Date   TSH 1.635 04/26/2017   TSH 2.564 06/27/2015      Assessment & Plan:   1. DM type 2 causing vascular disease (Randy Hebert)  - Randy Hebert has currently uncontrolled symptomatic type 2 DM since  51 years of age,  with most recent A1c of 7.6% from August 2018. He has no recent labs to review.  -his diabetes is complicated by   coronary artery disease which required stent placement, obesity/sedentary life, chronic heavy smoking, and Randy Hebert remains at a high risk for more acute and chronic complications which include CAD, CVA, CKD, retinopathy, and neuropathy. These are all discussed in detail with the patient.  - I have counseled him on diet management and weight loss, by adopting a carbohydrate restricted/protein rich diet.  - Suggestion is made for him to avoid simple carbohydrates  from his diet including Cakes, Sweet Desserts, Ice Cream, Soda (diet and regular), Sweet Tea, Candies, Chips, Cookies, Store Bought Juices, Alcohol in Excess of  1-2 drinks a day, Artificial Sweeteners, and "Sugar-free" Products. This will help patient to have stable blood glucose profile and potentially avoid unintended weight gain.  - I encouraged him to switch to  unprocessed or minimally processed complex starch and increased protein intake (animal or plant source), fruits, and vegetables.  - he is advised to stick to a routine mealtimes to eat 3 meals  a day and avoid unnecessary snacks ( to snack only to correct hypoglycemia).   - he will be scheduled with Randy Hebert, Randy Hebert, Randy Hebert for individualized diabetes education.  - I have approached him with the following individualized plan to manage diabetes and patient agrees:   - He seems to be motivated. Given his recent onset of diabetes he will be given a chance to treat it with minimal medications. - I did not add any additional medications today. - I will continue metformin 1000 mg by mouth twice a day, therapeutically suitable for patient .  - he has choices if he requires additional treatment, he will be considered for incretin therapy as appropriate next visit. - Patient specific target  A1c;  LDL, HDL, Triglycerides, and  Waist Circumference were discussed in detail.  2) BP/HTN: Uncontrolled. Patient has documented allergy to ACE inhibitor's . He is on diuretics, he  would be considered for calcium channel blockers during his next visit if his BP  remains above target. 3) Lipids/HPL:   Controlled, LDL 34.   Patient is advised to continue statins. 4)  Weight/Diet: Randy Hebert Consult will be initiated , exercise, and detailed carbohydrates information provided.  5) Chronic Care/Health Maintenance:  -he  is on Statin medications and  is encouraged to continue to follow up with Ophthalmology, Dentist,  Podiatrist at least yearly or according to recommendations, and advised to  Quit smoking ( is a chronic heavy smoker for the last 40 days). I have recommended yearly flu vaccine and pneumonia vaccination at least every 5 years; moderate intensity exercise for up to 150 minutes weekly; and  sleep for at least 7 hours a day.  - I advised patient to maintain close follow up with Randy Coffee Jennette Banker, Randy Hebert for primary care needs.  Follow up plan: - Return in about 3 months (around 11/15/2017) for follow up with pre-visit labs.  Glade Lloyd, Randy Hebert [facility] [address] [place],  [address] Phone: [phone]  Fax: [phone]    08/17/2017, 5:35 PM  This note  was partially dictated with voice recognition software. Similar sounding words can be transcribed inadequately or may not  be corrected upon review.

## 2017-08-17 NOTE — Patient Instructions (Signed)

## 2017-08-17 NOTE — Progress Notes (Signed)
Daily Session Note  Patient Details  Name: Randy Hebert MRN: [id number] Date of Birth: November 09, 1965 Referring Provider:     CARDIAC REHAB PHASE II ORIENTATION from 06/22/2017 in [place]  Referring Provider  Dr. [name]      Encounter Date: 08/17/2017  Check In: Session Check In - 08/17/17 0815      Check-In   Location  [facility]    Staff Present  Russella Dar, MS, EP, [facility], Exercise Physiologist;Verlene Glantz Wynetta Emery, RN, BSN;Gregory Cowan, BS, EP, Exercise Physiologist    Supervising physician immediately available to respond to emergencies  See telemetry face sheet for immediately available MD    Medication changes reported      No    Fall or balance concerns reported     No    Tobacco Cessation  Use Decreased Patient now smoking less than 1/2 pack daily.    Warm-up and Cool-down  Performed as group-led instruction    Resistance Training Performed  No    VAD Patient?  No      Pain Assessment   Currently in Pain?  No/denies    Pain Score  0-No pain    Multiple Pain Sites  No       Capillary Blood Glucose: No results found for this or any previous visit (from the past 24 hour(s)).    Social History   Tobacco Use  Smoking Status Current Every Day Smoker  . Packs/day: 1.00  . Years: 46.00  . Pack years: 46.00  . Types: Cigarettes  . Start date: 06/07/1979  Smokeless Tobacco Former Systems developer  . Types: Chew    Goals Met:  Independence with exercise equipment Exercise tolerated well No report of cardiac concerns or symptoms Strength training completed today  Goals Unmet:  Not Applicable  Comments: Check out 915.   Dr. Kate Sable is Medical Director for [facility].

## 2017-08-18 ENCOUNTER — Ambulatory Visit: Payer: Medicaid Other | Admitting: "Endocrinology

## 2017-08-19 ENCOUNTER — Encounter (HOSPITAL_COMMUNITY)
Admission: RE | Admit: 2017-08-19 | Discharge: 2017-08-19 | Disposition: A | Discharge status: Home / Self Care | Payer: Medicaid Other | Source: Ambulatory Visit | Attending: Cardiology | Admitting: Cardiology

## 2017-08-19 ENCOUNTER — Other Ambulatory Visit: Payer: Self-pay | Admitting: Internal Medicine

## 2017-08-19 DIAGNOSIS — Z955 Presence of coronary angioplasty implant and graft: Secondary | ICD-10-CM

## 2017-08-19 DIAGNOSIS — I214 Non-ST elevation (NSTEMI) myocardial infarction: Secondary | ICD-10-CM

## 2017-08-19 NOTE — Progress Notes (Signed)
Daily Session Note  Patient Details  Name: Randy Hebert MRN: [id number] Date of Birth: Dec 28, 1965 Referring Provider:     CARDIAC REHAB PHASE II ORIENTATION from 06/22/2017 in [place]  Referring Provider  Dr. [name]      Encounter Date: 08/19/2017  Check In: Session Check In - 08/19/17 0821      Check-In   Location  [facility]    Staff Present  Suzanne Boron, BS, EP, Exercise Physiologist;Debra Wynetta Emery, RN, BSN    Supervising physician immediately available to respond to emergencies  See telemetry face sheet for immediately available MD    Medication changes reported      No    Fall or balance concerns reported     No    Tobacco Cessation  Use Decreased    Warm-up and Cool-down  Performed as group-led instruction    Resistance Training Performed  Yes    VAD Patient?  No      Pain Assessment   Currently in Pain?  No/denies    Pain Score  0-No pain    Multiple Pain Sites  No       Capillary Blood Glucose: No results found for this or any previous visit (from the past 24 hour(s)).  Exercise Prescription Changes - 08/18/17 1000      Response to Exercise   Blood Pressure (Admit)  148/80    Blood Pressure (Exercise)  150/80    Blood Pressure (Exit)  130/80    Heart Rate (Admit)  82 bpm    Heart Rate (Exercise)  108 bpm    Heart Rate (Exit)  92 bpm    Rating of Perceived Exertion (Exercise)  10    Duration  Progress to 30 minutes of  aerobic without signs/symptoms of physical distress    Intensity  [facility] [phone]      Progression   Progression  Continue to progress workloads to maintain intensity without signs/symptoms of physical distress.      Resistance Training   Training Prescription  Yes    Weight  2    Reps  10-15      Treadmill   MPH  1.5    Grade  0    Minutes  15    METs  2.1      NuStep   Level  3    SPM  117    Minutes  20    METs  3.5      Home Exercise Plan   Plans to continue exercise  at  Home (comment)    Frequency  Add 2 additional days to program exercise sessions.    Initial Home Exercises Provided  07/08/17       Social History   Tobacco Use  Smoking Status Current Every Day Smoker  . Packs/day: 1.00  . Years: 46.00  . Pack years: 46.00  . Types: Cigarettes  . Start date: 06/07/1979  Smokeless Tobacco Former Systems developer  . Types: Chew    Goals Met:  Independence with exercise equipment Exercise tolerated well No report of cardiac concerns or symptoms Strength training completed today  Goals Unmet:  Not Applicable  Comments: Check out 915   Dr. Kate Sable is Medical Director for [place] and Pulmonary Re[facility].

## 2017-08-19 NOTE — Progress Notes (Signed)
Cardiac Individual Treatment Plan  Patient Details  Name: Randy Hebert MRN: [id number] Date of Birth: 04-Nov-1965 Referring Provider:     CARDIAC REHAB [place] from 06/22/2017 in [place]  Referring Provider  Dr. [name]      Initial Encounter Date:    CARDIAC REHAB PHASE II ORIENTATION from 06/22/2017 in [place]  Date  06/22/17  Referring Provider  Dr. [name]      Visit Diagnosis: NSTEMI (non-ST elevated myocardial infarction) [facility])  Status post coronary artery stent placement  Patient's Home Medications on Admission:  Current Outpatient Medications:  .  acetaminophen (TYLENOL) 325 MG tablet, Take 650 mg by mouth every 6 (six) hours as needed for mild pain., Disp: , Rfl:  .  albuterol (PROVENTIL) (2.5 MG/3ML) 0.083% nebulizer solution, Take 3 mLs (2.5 mg total) by nebulization every 6 (six) hours as needed for wheezing or shortness of breath., Disp: 150 mL, Rfl: 1 .  aspirin 81 MG EC tablet, Take 1 tablet (81 mg total) by mouth daily., Disp: 30 tablet, Rfl:  .  atorvastatin (LIPITOR) 80 MG tablet, Take 80 mg by mouth daily., Disp: , Rfl:  .  Carboxymeth-Glycerin-Polysorb (REFRESH OPTIVE ADVANCED) 0.5-1-0.5 % SOLN, Apply 1 drop to eye 2 (two) times daily as needed (dry eyes). , Disp: , Rfl:  .  citalopram (CELEXA) 20 MG tablet, Take 20 mg daily by mouth., Disp: , Rfl:  .  cycloSPORINE (RESTASIS) 0.05 % ophthalmic emulsion, Place 1 drop into both eyes 2 (two) times daily., Disp: , Rfl:  .  furosemide (LASIX) 40 MG tablet, Take 1 tablet (40 mg total) by mouth daily., Disp: 90 tablet, Rfl: 3 .  glucose blood (IGLUCOSE TEST STRIPS) test strip, 1 each by Other route 2 (two) times daily. Use as instructed, Disp: 100 each, Rfl: 12 .  Magnesium 200 MG TABS, Take 1 tablet (200 mg total) by mouth daily., Disp: 30 each, Rfl: 6 .  metFORMIN (GLUCOPHAGE) 1000 MG tablet, Take 1 tablet (1,000 mg total) by mouth 2 (two) times daily  with a meal., Disp: 180 tablet, Rfl: 3 .  mometasone-formoterol (DULERA) 100-5 MCG/ACT AERO, Inhale 2 puffs into the lungs 2 (two) times daily., Disp: 3 Inhaler, Rfl: 3 .  nicotine (NICODERM CQ - DOSED IN MG/24 HOURS) 21 mg/24hr patch, Place 1 patch (21 mg total) onto the skin daily. (Patient taking differently: Place 21 mg onto the skin daily. ), Disp: 28 patch, Rfl: 0 .  nitroGLYCERIN (NITROSTAT) 0.4 MG SL tablet, Place 1 tablet (0.4 mg total) under the tongue every 5 (five) minutes as needed for chest pain., Disp: 25 tablet, Rfl: 3 .  Omega-3 Fatty Acids (FISH OIL) 1200 MG CAPS, Take 1,200 mg by mouth 2 (two) times daily., Disp: , Rfl:  .  pantoprazole (PROTONIX) 40 MG tablet, Take 1 tablet (40 mg total) by mouth daily., Disp: 30 tablet, Rfl: 11 .  potassium chloride SA (K-DUR,KLOR-CON) 20 MEQ tablet, Take 1 tablet (20 mEq total) by mouth daily., Disp: 30 tablet, Rfl: 3 .  prasugrel (EFFIENT) 10 MG TABS tablet, Take 1 tablet (10 mg total) by mouth daily., Disp: 90 tablet, Rfl: 1 .  PROVENTIL HFA 108 (90 Base) MCG/ACT inhaler, INHALE 2 PUFFS INTO THE LUNGS EVERY 6 HOURS AS NEEDED FOR SHORTNESS OF BREATH OR WHEEZING., Disp: 18 g, Rfl: 1 .  traZODone (DESYREL) 100 MG tablet, Take 100 mg by mouth at bedtime. , Disp: , Rfl:   Past Medical History: Past  Medical History:  Diagnosis Date  . Allergy   . Anxiety   . Arthritis   . Asthma    hip replacement  . Back pain   . Bulging of cervical intervertebral disc   . CAD (coronary artery disease)    lateral STEMI 02/06/2015 00% D1 occlusion treated with Promus Premier 2.5 mm x 16 mm DES, 70% ramus stenosis, 40% mid RCA stenosis, 45% distal RCA stenosis, EF 45-50%  . CHF (congestive heart failure) ([place])   . COPD (chronic obstructive pulmonary disease) ([place])   . Depression   . Diabetes mellitus without complication ([place])   . 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 Dry eye   . Dyspnea   . Early satiety  09/23/2016  . GERD (gastroesophageal reflux disease)   . Headache   . Heart murmur   . Hip pain   . Hyperlipidemia   . Hypertension   . MI (myocardial infarction) ([place])   . Myocardial infarction ([place])   . Neck pain   . Otitis media   . Pleurisy   . Sinus pause    9 sec sinus pause on telemetry after started on coreg after MI, avoid AV nodal blocking agent  . Sleep apnea   . 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 Unilateral primary osteoarthritis, right hip 07/01/2016    Tobacco Use: Social History   Tobacco Use  Smoking Status Current Every Day Smoker  . Packs/day: 1.00  . Years: 46.00  . Pack years: 46.00  . Types: Cigarettes  . Start date: 06/07/1979  Smokeless Tobacco Former Systems developer  . Types: Chew    Labs: Recent Review Flowsheet Data    Labs for ITP Cardiac and Pulmonary Rehab Latest Ref Rng & Units 05/20/2016 08/05/2016 11/11/2016 04/15/2017 04/27/2017   Cholestrol 0 - 200 mg/dL 128 132 112 134 93   LDLCALC 0 - 99 mg/dL 60 56 49 71 34   HDL >40 mg/dL 24(L) 22(L) 20(L) 24(L) 18(L)   Trlycerides <150 mg/dL 222(H) 272(H) 214(H) 193(H) 206(H)   Hemoglobin A1c <5.7 % 8.0(H) 6.3(H) 6.8(H) 7.6(H) -   PHART 7.350 - 7.450 - - - - -   PCO2ART 35.0 - 45.0 mmHg - - - - -   HCO3 20.0 - 24.0 mEq/L - - - - -   TCO2 0 - 100 mmol/L - - - - -   O2SAT % - - - - -      Capillary Blood Glucose: Lab Results  Component Value Date   GLUCAP 138 (H) 06/30/2017   GLUCAP 274 (H) 06/29/2017   GLUCAP 135 (H) 06/29/2017   GLUCAP 147 (H) 06/29/2017   GLUCAP 139 (H) 06/29/2017     Exercise Target Goals:    Exercise Program Goal: Individual exercise prescription set with THRR, safety & activity barriers. Participant demonstrates ability to understand and report RPE using BORG scale, to self-measure pulse accurately, and to acknowledge the importance of the exercise prescription.  Exercise Prescription Goal: Starting with aerobic activity 30 plus minutes a day, 3 days per week for initial  exercise prescription. Provide home exercise prescription and guidelines that participant acknowledges understanding prior to discharge.  Activity Barriers & Risk Stratification: Activity Barriers & Cardiac Risk Stratification - 06/22/17 1340      Activity Barriers & Cardiac Risk Stratification   Activity Barriers  Arthritis;Back Problems;Right Hip Replacement;Joint Problems;Deconditioning;Muscular Weakness;Shortness of Breath;Balance Concerns    Cardiac Risk Stratification  High  6 Minute Walk: 6 Minute Walk    Row Name 06/22/17 1340         6 Minute Walk   Phase  Initial     Distance  1000 feet     Distance % Change  0 %     Distance Feet Change  0 ft     Walk Time  6 minutes     # of Rest Breaks  0     MPH  1.89     METS  1.59     RPE  10     Perceived Dyspnea   13     VO2 Peak  11.3     Symptoms  No     Resting HR  77 bpm     Resting BP  124/88     Resting Oxygen Saturation   95 %     Exercise Oxygen Saturation  during 6 min walk  91 %     Max Ex. HR  103 bpm     Max Ex. BP  184/100     2 Minute Post BP  126/84        Oxygen Initial Assessment:   Oxygen Re-Evaluation: Oxygen Re-Evaluation    Row Name 08/19/17 1302             Program Oxygen Prescription   Program Oxygen Prescription  None         Home Oxygen   Home Oxygen Device  Home Concentrator       Sleep Oxygen Prescription  CPAP       Liters per minute  2       Home Exercise Oxygen Prescription  None       Home at Rest Exercise Oxygen Prescription  None       Compliance with Home Oxygen Use  Yes         Goals/Expected Outcomes   Short Term Goals  To learn and exhibit compliance with exercise, home and travel O2 prescription       Long  Term Goals  Exhibits compliance with exercise, home and travel O2 prescription       Comments  Patient uses O2 with CPAP at night at 2 L/Min. He is complaint with his prescription.       Goals/Expected Outcomes  Patient will continue to be complaint with  his O2/CPAP prescription.           Oxygen Discharge (Final Oxygen Re-Evaluation): Oxygen Re-Evaluation - 08/19/17 1302      Program Oxygen Prescription   Program Oxygen Prescription  None      Home Oxygen   Home Oxygen Device  Home Concentrator    Sleep Oxygen Prescription  CPAP    Liters per minute  2    Home Exercise Oxygen Prescription  None    Home at Rest Exercise Oxygen Prescription  None    Compliance with Home Oxygen Use  Yes      Goals/Expected Outcomes   Short Term Goals  To learn and exhibit compliance with exercise, home and travel O2 prescription    Long  Term Goals  Exhibits compliance with exercise, home and travel O2 prescription    Comments  Patient uses O2 with CPAP at night at 2 L/Min. He is complaint with his prescription.    Goals/Expected Outcomes  Patient will continue to be complaint with his O2/CPAP prescription.        Initial Exercise Prescription: Initial Exercise Prescription - 06/22/17 1300  Date of Initial Exercise RX and Referring Provider   Date  06/22/17    Referring Provider  Dr. [name]      NuStep   Level  2    SPM  50    Minutes  20    METs  1.7      Arm Ergometer   Level  1.5    Watts  12    RPM  12    Minutes  15    METs  1.9      Prescription Details   Frequency (times per week)  3    Duration  Progress to 30 minutes of continuous aerobic without signs/symptoms of physical distress      Intensity   THRR 40-80% of Max Heartrate  113-132-150    Ratings of Perceived Exertion  11-13    Perceived Dyspnea  0-4      Progression   Progression  Continue progressive overload as per policy without signs/symptoms or physical distress.      Resistance Training   Training Prescription  Yes    Weight  1    Reps  10-15       Perform Capillary Blood Glucose checks as needed.  Exercise Prescription Changes:  Exercise Prescription Changes    Row Name 07/08/17 0800 07/15/17 1400 07/28/17 0800 08/18/17 1000        Response to Exercise   Blood Pressure (Admit)  -  142/74  132/76  148/80    Blood Pressure (Exercise)  -  150/78  156/84  150/80    Blood Pressure (Exit)  -  138/74  128/72  130/80    Heart Rate (Admit)  -  91 bpm  92 bpm  82 bpm    Heart Rate (Exercise)  -  107 bpm  112 bpm  108 bpm    Heart Rate (Exit)  -  98 bpm  102 bpm  92 bpm    Rating of Perceived Exertion (Exercise)  -  13  10  10     Duration  -  Progress to 30 minutes of  aerobic without signs/symptoms of physical distress  Progress to 30 minutes of  aerobic without signs/symptoms of physical distress  Progress to 30 minutes of  aerobic without signs/symptoms of physical distress    Intensity  -  THRR New 122-137-153  THRR New 122-138-153  THRR New [phone]      Progression   Progression  -  Continue to progress workloads to maintain intensity without signs/symptoms of physical distress.  Continue to progress workloads to maintain intensity without signs/symptoms of physical distress.  Continue to progress workloads to maintain intensity without signs/symptoms of physical distress.      Resistance Training   Training Prescription  Yes  Yes  Yes  Yes    Weight  1  1  2  2     Reps  10-15  10-15  10-15  10-15      Treadmill   MPH  -  1.2  1.4  1.5    Grade  -  0  0  0    Minutes  -  15  15  15     METs  -  1.9  2  2.1      NuStep   Level  2  2  2  3     SPM  50  105  133  117    Minutes  20  20  20   20  METs  1.7  3.4  5.5  3.5      Arm Ergometer   Level  1.5  -  -  -    Watts  12  -  -  -    RPM  12  -  -  -    Minutes  15  -  -  -    METs  1.9  -  -  -      Home Exercise Plan   Plans to continue exercise at  Home (comment)  Home (comment)  Home (comment)  Home (comment)    Frequency  Add 2 additional days to program exercise sessions.  Add 2 additional days to program exercise sessions.  Add 2 additional days to program exercise sessions.  Add 2 additional days to program exercise sessions.    Initial Home  Exercises Provided  07/08/17  07/08/17  07/08/17  07/08/17       Exercise Comments:  Exercise Comments    Row Name 06/25/17 1529 07/08/17 0836 07/28/17 0816 08/18/17 1003     Exercise Comments  Patient has not yet started CR and will be progressed in time.   Patient received the take home exercise plan today 07/08/2017. THR was addressed as were safe ways to be active outside of Rehab. Patient demonstrated an understanding. Patient was encouraged to ask any future questions.   Patient is doing very well in CR. He has increased his spee on the treadmill and has increased his watts and METs very high in the Nustep machine. He also has a lower blood pressure than before. He also has gone up in weight for teh warm up to two pounds.   Patient is doing very well in CR. He has increased his speed on the treadmill as well as his level on the Nustep. He does not use the handle bars on the Nustep due to shoulder pain. He states that this program has helped him gain endurance and stamina enough to walk and play with his dog. He has also decreased his usage of cigarrettes.       Exercise Goals and Review:  Exercise Goals    Row Name 06/22/17 1342             Exercise Goals   Increase Physical Activity  Yes       Intervention  Provide advice, education, support and counseling about physical activity/exercise needs.;Develop an individualized exercise prescription for aerobic and resistive training based on initial evaluation findings, risk stratification, comorbidities and participant's personal goals.       Expected Outcomes  Achievement of increased cardiorespiratory fitness and enhanced flexibility, muscular endurance and strength shown through measurements of functional capacity and personal statement of participant.       Increase Strength and Stamina  Yes       Intervention  Provide advice, education, support and counseling about physical activity/exercise needs.;Develop an individualized exercise  prescription for aerobic and resistive training based on initial evaluation findings, risk stratification, comorbidities and participant's personal goals.       Expected Outcomes  Achievement of increased cardiorespiratory fitness and enhanced flexibility, muscular endurance and strength shown through measurements of functional capacity and personal statement of participant.       Able to understand and use rate of perceived exertion (RPE) scale  Yes       Intervention  Provide education and explanation on how to use RPE scale       Expected Outcomes  Short  Term: Able to use RPE daily in rehab to express subjective intensity level;Long Term:  Able to use RPE to guide intensity level when exercising independently       Able to understand and use Dyspnea scale  Yes       Intervention  Provide education and explanation on how to use Dyspnea scale       Expected Outcomes  Short Term: Able to use Dyspnea scale daily in rehab to express subjective sense of shortness of breath during exertion;Long Term: Able to use Dyspnea scale to guide intensity level when exercising independently       Knowledge and understanding of Target Heart Rate Range (THRR)  Yes       Intervention  Provide education and explanation of THRR including how the numbers were predicted and where they are located for reference       Expected Outcomes  Short Term: Able to state/look up THRR       Able to check pulse independently  Yes       Intervention  Provide education and demonstration on how to check pulse in carotid and radial arteries.;Review the importance of being able to check your own pulse for safety during independent exercise       Expected Outcomes  Short Term: Able to explain why pulse checking is important during independent exercise;Long Term: Able to check pulse independently and accurately       Understanding of Exercise Prescription  Yes       Intervention  Provide education, explanation, and written materials on  patient's individual exercise prescription       Expected Outcomes  Short Term: Able to explain program exercise prescription;Long Term: Able to explain home exercise prescription to exercise independently          Exercise Goals Re-Evaluation : Exercise Goals Re-Evaluation    Row Name 07/28/17 0814 08/18/17 1001           Exercise Goal Re-Evaluation   Exercise Goals Review  Increase Physical Activity;Increase Strength and Stamina;Knowledge and understanding of Target Heart Rate Range (THRR)  Increase Physical Activity;Increase Strength and Stamina;Knowledge and understanding of Target Heart Rate Range (THRR)      Comments  Patient is doing very well in CR. He has increased his spee on the treadmill and has increased his watts and METs very high in the Nustep machine. He also has a lower blood pressure than before. He also has gone up in weight for teh warm up to two pounds.   Patient is doing very well in CR. He has increased his speed on the treadmill as well as his level on the Nustep. He does not use the handle bars on the Nustep due to shoulder pain. He states that this program has helped him gain endurance and stamina enough to walk and play with his dog. He has also decreased his usage of cigarrettes.       Expected Outcomes  Patient wishes to breathe better and to lose weight.   Patient wishes to breathe better and to lose weight over time.           Discharge Exercise Prescription (Final Exercise Prescription Changes): Exercise Prescription Changes - 08/18/17 1000      Response to Exercise   Blood Pressure (Admit)  148/80    Blood Pressure (Exercise)  150/80    Blood Pressure (Exit)  130/80    Heart Rate (Admit)  82 bpm    Heart Rate (Exercise)  108  bpm    Heart Rate (Exit)  92 bpm    Rating of Perceived Exertion (Exercise)  10    Duration  Progress to 30 minutes of  aerobic without signs/symptoms of physical distress    Intensity  THRR New [phone]      Progression    Progression  Continue to progress workloads to maintain intensity without signs/symptoms of physical distress.      Resistance Training   Training Prescription  Yes    Weight  2    Reps  10-15      Treadmill   MPH  1.5    Grade  0    Minutes  15    METs  2.1      NuStep   Level  3    SPM  117    Minutes  20    METs  3.5      Home Exercise Plan   Plans to continue exercise at  Home (comment)    Frequency  Add 2 additional days to program exercise sessions.    Initial Home Exercises Provided  07/08/17       Nutrition:  Target Goals: Understanding of nutrition guidelines, daily intake of sodium 1500mg , cholesterol 200mg , calories 30% from fat and 7% or less from saturated fats, daily to have 5 or more servings of fruits and vegetables.  Biometrics: Pre Biometrics - 06/22/17 1343      Pre Biometrics   Height  5\' 6"  (1.676 m)    Weight  230 lb 9.6 oz (104.6 kg)    Waist Circumference  50 inches    Hip Circumference  43 inches    Waist to Hip Ratio  1.16 %    BMI (Calculated)  37.24    Triceps Skinfold  6 mm    % Body Fat  32 %    Grip Strength  62.33 kg    Flexibility  0 in    Single Leg Stand  15 seconds        Nutrition Therapy Plan and Nutrition Goals: Nutrition Therapy & Goals - 08/06/17 1518      Personal Nutrition Goals   Nutrition Goal  For heart healthy choices add >50% of whole grains, make half their plate fruits and vegetables. Discuss the difference between starchy vegetables and leafy greens, and how leafy vegetables provide fiber, helps maintain healthy weight, helps control blood glucose, and lowers cholesterol.  Discuss purchasing fresh or frozen vegetable to reduce sodium and not to add grease, fat or sugar. Consume <18oz of red meat per week. Consume lean cuts of meats and very little of meats high in sodium and nitrates such as pork and lunch meats. Discussed portion control for all food groups.      Personal Goal #2  Continues to eat heart healthy  low NA diabetic diet.     Additional Goals?  No    Comments  Patient attended nutrition class.       Intervention Plan   Intervention  Prescribe, educate and counsel regarding individualized specific dietary modifications aiming towards targeted core components such as weight, hypertension, lipid management, diabetes, heart failure and other comorbidities.;Nutrition handout(s) given to patient.    Expected Outcomes  Short Term Goal: Understand basic principles of dietary content, such as calories, fat, sodium, cholesterol and nutrients.       Nutrition Discharge: Rate Your Plate Scores: Nutrition Assessments - 06/22/17 1526      MEDFICTS Scores   Pre Score  64  Nutrition Goals Re-Evaluation: Nutrition Goals Re-Evaluation    Edie Name 08/19/17 1305             Goals   Current Weight  227 lb (103 kg)       Nutrition Goal  For heart healthy choices add >50% of whole grains, make half their plate fruits and vegetables. Discuss the difference between starchy vegetables and leafy greens, and how leafy vegetables provide fiber, helps maintain healthy weight, helps control blood glucose, and lowers cholesterol.  Discuss purchasing fresh or frozen vegetable to reduce sodium and not to add grease, fat or sugar. Consume <18oz of red meat per week. Consume lean cuts of meats and very little of meats high in sodium and nitrates such as pork and lunch meats. Discussed portion control for all food groups.       Comment  Patient has lost 3 lbs and is continuing to follow a heart healthy, low Na and diabetic diet. He says he is meeting with a dietician tomorrow to start on a reduced calorie diet. Will continue to monitor.        Expected Outcome  Patient will continue to meet his nutrtion goals at discharge.          Personal Goal #2 Re-Evaluation   Personal Goal #2  Eat heart healthy/ low na and diabetic diet.           Nutrition Goals Discharge (Final Nutrition Goals  Re-Evaluation): Nutrition Goals Re-Evaluation - 08/19/17 1305      Goals   Current Weight  227 lb (103 kg)    Nutrition Goal  For heart healthy choices add >50% of whole grains, make half their plate fruits and vegetables. Discuss the difference between starchy vegetables and leafy greens, and how leafy vegetables provide fiber, helps maintain healthy weight, helps control blood glucose, and lowers cholesterol.  Discuss purchasing fresh or frozen vegetable to reduce sodium and not to add grease, fat or sugar. Consume <18oz of red meat per week. Consume lean cuts of meats and very little of meats high in sodium and nitrates such as pork and lunch meats. Discussed portion control for all food groups.    Comment  Patient has lost 3 lbs and is continuing to follow a heart healthy, low Na and diabetic diet. He says he is meeting with a dietician tomorrow to start on a reduced calorie diet. Will continue to monitor.     Expected Outcome  Patient will continue to meet his nutrtion goals at discharge.       Personal Goal #2 Re-Evaluation   Personal Goal #2  Eat heart healthy/ low na and diabetic diet.        Psychosocial: Target Goals: Acknowledge presence or absence of significant depression and/or stress, maximize coping skills, provide positive support system. Participant is able to verbalize types and ability to use techniques and skills needed for reducing stress and depression.  Initial Review & Psychosocial Screening: Initial Psych Review & Screening - 06/22/17 1530      Initial Review   Current issues with  Current Depression;Current Anxiety/Panic      Family Dynamics   Good Support System?  Yes      Barriers   Psychosocial barriers to participate in program  There are no identifiable barriers or psychosocial needs.;The patient should benefit from training in stress management and relaxation.      Screening Interventions   Interventions  Encouraged to exercise;Provide feedback about the  scores to participant  Quality of Life Scores: Quality of Life - 06/22/17 1344      Quality of Life Scores   Health/Function Pre  9 %    Socioeconomic Pre  13 %    Psych/Spiritual Pre  17 %    Family Pre  16.8 %    GLOBAL Pre  12.64 %       PHQ-9: Recent Review Flowsheet Data    Depression screen [facility] 2/9 08/17/2017 06/22/2017 06/17/2017 04/17/2017 04/15/2017   Decreased Interest 0 2 0 0 0   Down, Depressed, Hopeless 0 2 0 0 0   PHQ - 2 Score 0 4 0 0 0   Altered sleeping - 2 - - -   Tired, decreased energy - 2 - - -   Change in appetite - 3 - - -   Feeling bad or failure about yourself  - 3 - - -   Trouble concentrating - 1 - - -   Moving slowly or fidgety/restless - 0 - - -   Suicidal thoughts - 0 - - -   PHQ-9 Score - 15 - - -   Difficult doing work/chores - Extremely dIfficult - - -     Interpretation of Total Score  Total Score Depression Severity:  1-4 = Minimal depression, 5-9 = Mild depression, 10-14 = Moderate depression, 15-19 = Moderately severe depression, 20-27 = Severe depression   Psychosocial Evaluation and Intervention: Psychosocial Evaluation - 06/22/17 1531      Psychosocial Evaluation & Interventions   Interventions  Relaxation education;Encouraged to exercise with the program and follow exercise prescription    Continue Psychosocial Services   Follow up required by staff       Psychosocial Re-Evaluation: Psychosocial Re-Evaluation    Row Name 07/29/17 1455 08/19/17 1317           Psychosocial Re-Evaluation   Current issues with  Current Depression;Current Anxiety/Panic  Current Anxiety/Panic;Current Depression      Comments  Patient's initial QOL was 12.64 and his PHQ-9 was 15. He is seeing a Social worker and is being treated with trazadone and alprazolam.   Patient depression and anxiety continue to be managed with Trazadone and as needed Alprazolam.       Expected Outcomes  Patient's depression and anxiety will be managed at discharge with  no additional psychosocial barriers identified.   Patient's depression and anxiety will continue to be managed at discharge with no other psychosocial issues identified.       Interventions  Stress management education;Encouraged to attend Cardiac Rehabilitation for the exercise  Stress management education;Encouraged to attend Cardiac Rehabilitation for the exercise;Relaxation education      Continue Psychosocial Services   Follow up required by staff  Follow up required by staff         Psychosocial Discharge (Final Psychosocial Re-Evaluation): Psychosocial Re-Evaluation - 08/19/17 1317      Psychosocial Re-Evaluation   Current issues with  Current Anxiety/Panic;Current Depression    Comments  Patient depression and anxiety continue to be managed with Trazadone and as needed Alprazolam.     Expected Outcomes  Patient's depression and anxiety will continue to be managed at discharge with no other psychosocial issues identified.     Interventions  Stress management education;Encouraged to attend Cardiac Rehabilitation for the exercise;Relaxation education    Continue Psychosocial Services   Follow up required by staff       Vocational Rehabilitation: Provide vocational rehab assistance to qualifying candidates.   Vocational Rehab Evaluation & Intervention:  Vocational Rehab - 06/22/17 1524      Initial Vocational Rehab Evaluation & Intervention   Assessment shows need for Vocational Rehabilitation  No       Education: Education Goals: Education classes will be provided on a weekly basis, covering required topics. Participant will state understanding/return demonstration of topics presented.  Learning Barriers/Preferences: Learning Barriers/Preferences - 06/22/17 1520      Learning Barriers/Preferences   Learning Barriers  None    Learning Preferences  Skilled Demonstration;Individual Instruction;Group Instruction;Video;Written Material;Pictoral       Education  Topics: Hypertension, Hypertension Reduction -Define heart disease and high blood pressure. Discus how high blood pressure affects the body and ways to reduce high blood pressure.   CARDIAC REHAB PHASE II EXERCISE from 08/19/2017 in [place]  Date  07/08/17  Educator  [place]  Instruction Review Code  2- Demonstrated Understanding      Exercise and Your Heart -Discuss why it is important to exercise, the FITT principles of exercise, normal and abnormal responses to exercise, and how to exercise safely.   CARDIAC REHAB PHASE II EXERCISE from 08/19/2017 in [place]  Date  07/15/17  Educator  DC  Instruction Review Code  2- Demonstrated Understanding      Angina -Discuss definition of angina, causes of angina, treatment of angina, and how to decrease risk of having angina.   CARDIAC REHAB PHASE II EXERCISE from 08/19/2017 in [place]  Date  07/22/17  Educator  D. Coad  Instruction Review Code  2- Demonstrated Understanding      Cardiac Medications -Review what the following cardiac medications are used for, how they affect the body, and side effects that may occur when taking the medications.  Medications include Aspirin, Beta blockers, calcium channel blockers, ACE Inhibitors, angiotensin receptor blockers, diuretics, digoxin, and antihyperlipidemics.   CARDIAC REHAB PHASE II EXERCISE from 08/19/2017 in [place]  Date  07/29/17  Educator  DJ  Instruction Review Code  2- Demonstrated Understanding      Congestive Heart Failure -Discuss the definition of CHF, how to live with CHF, the signs and symptoms of CHF, and how keep track of weight and sodium intake.   CARDIAC REHAB PHASE II EXERCISE from 08/19/2017 in [place]  Date  08/05/17  Educator  DC  Instruction Review Code  2- Demonstrated Understanding      Heart Disease and Intimacy -Discus the effect  sexual activity has on the heart, how changes occur during intimacy as we age, and safety during sexual activity.   Smoking Cessation / COPD -Discuss different methods to quit smoking, the health benefits of quitting smoking, and the definition of COPD.   CARDIAC REHAB PHASE II EXERCISE from 08/19/2017 in [place]  Date  08/19/17  Educator  DC  Instruction Review Code  2- Demonstrated Understanding      Nutrition I: Fats -Discuss the types of cholesterol, what cholesterol does to the heart, and how cholesterol levels can be controlled.   Nutrition II: Labels -Discuss the different components of food labels and how to read food label   Heart Parts and Heart Disease -Discuss the anatomy of the heart, the pathway of blood circulation through the heart, and these are affected by heart disease.   Stress I: Signs and Symptoms -Discuss the causes of stress, how stress may lead to anxiety and depression, and ways to limit stress.   Stress II: Relaxation -Discuss different types  of relaxation techniques to limit stress.   Warning Signs of Stroke / TIA -Discuss definition of a stroke, what the signs and symptoms are of a stroke, and how to identify when someone is having stroke.   Knowledge Questionnaire Score: Knowledge Questionnaire Score - 06/22/17 1523      Knowledge Questionnaire Score   Pre Score  23/24       Core Components/Risk Factors/Patient Goals at Admission: Personal Goals and Risk Factors at Admission - 06/22/17 1527      Core Components/Risk Factors/Patient Goals on Admission    Weight Management  Yes    Admit Weight  202 lb (91.6 kg)    Goal Weight: Short Term  177 lb (80.3 kg)    Goal Weight: Long Term  152 lb (68.9 kg)    Expected Outcomes  Short Term: Continue to assess and modify interventions until short term weight is achieved;Long Term: Adherence to nutrition and physical activity/exercise program aimed toward attainment of  established weight goal    Tobacco Cessation  Yes    Intervention  Assist the participant in steps to quit. Provide individualized education and counseling about committing to Tobacco Cessation, relapse prevention, and pharmacological support that can be provided by physician.    Expected Outcomes  Short Term: Will quit all tobacco product use, adhering to prevention of relapse plan.;Long Term: Complete abstinence from all tobacco products for at least 12 months from quit date.    Improve shortness of breath with ADL's  Yes    Intervention  Provide education, individualized exercise plan and daily activity instruction to help decrease symptoms of SOB with activities of daily living.    Expected Outcomes  Short Term: Achieves a reduction of symptoms when performing activities of daily living.    Stress  Yes    Intervention  Offer individual and/or small group education and counseling on adjustment to heart disease, stress management and health-related lifestyle change. Teach and support self-help strategies.    Expected Outcomes  Short Term: Participant demonstrates changes in health-related behavior, relaxation and other stress management skills, ability to obtain effective social support, and compliance with psychotropic medications if prescribed.    Personal Goal Other  Yes    Personal Goal  Breathe better, Lose 50 lbs    Intervention  Attend program 3 x week and supplement 2 x week exercise at home    Expected Outcomes  Meet personal goals       Core Components/Risk Factors/Patient Goals Review:  Goals and Risk Factor Review    Row Name 07/29/17 1449 08/19/17 1309           Core Components/Risk Factors/Patient Goals Review   Personal Goals Review  Weight Management/Obesity;Diabetes;Tobacco Cessation Breathe better; lose 50 lbs long term.   Weight Management/Obesity;Improve shortness of breath with ADL's;Diabetes;Tobacco Cessation Breathe better; lose 50 lbs long term.       Review   Patient has completed 11 sessions gaining 1 lb. He is doing well in the program with progression. He continues to smoke but says he is trying to cut back. He is trying to alter his diet by cutting out soda's and cutting down on candy and sweets. His last A1C was 7.6 on 04/15/17. He says he is breathing better noting less SOB with activity. He says he feels stronger and is walking more at home. He has a stationary bike and hopes to work up to riding it. Will continue to monitor.   Patient has completed 17 sessions losing  4 lbs. Patient has reduced his smoking to 1/2 pk/day. He is not interested in tobacco cessation program but says he can quit on his own. His last A1C was 03/10/16 8.2. He states he has more energy and feels stronger. He is able to do more around the house now and is walking more and further. Will continue to monitor.       Expected Outcomes  Patient will continue to attend sessions and complete the program and continue to meet his personal goals.   Patient will complete the program and continue to meet his personal goals.          Core Components/Risk Factors/Patient Goals at Discharge (Final Review):  Goals and Risk Factor Review - 08/19/17 1309      Core Components/Risk Factors/Patient Goals Review   Personal Goals Review  Weight Management/Obesity;Improve shortness of breath with ADL's;Diabetes;Tobacco Cessation Breathe better; lose 50 lbs long term.     Review  Patient has completed 17 sessions losing 4 lbs. Patient has reduced his smoking to 1/2 pk/day. He is not interested in tobacco cessation program but says he can quit on his own. His last A1C was 03/10/16 8.2. He states he has more energy and feels stronger. He is able to do more around the house now and is walking more and further. Will continue to monitor.     Expected Outcomes  Patient will complete the program and continue to meet his personal goals.        ITP Comments: ITP Comments    Row Name 06/29/17 0755 08/06/17 1519          ITP Comments  Patient new to program. He is currently being evaluated for a syncope episode. Plans to start 07/06/17. Will continue to monitor.   Pattient attended family matters with hospital chaplain to discuss how his recent diagnosis has changed his life.          Comments: ITP 30 Day REVIEW Patient doing well in the program. Will continue to monitor for progress.

## 2017-08-20 ENCOUNTER — Other Ambulatory Visit: Payer: Self-pay | Admitting: Internal Medicine

## 2017-08-20 ENCOUNTER — Encounter: Payer: Self-pay | Admitting: Nutrition

## 2017-08-20 ENCOUNTER — Encounter: Payer: Medicaid Other | Attending: Cardiology | Admitting: Nutrition

## 2017-08-20 VITALS — Ht 66.0 in | Wt 224.0 lb

## 2017-08-20 DIAGNOSIS — Z713 Dietary counseling and surveillance: Secondary | ICD-10-CM | POA: Diagnosis not present

## 2017-08-20 DIAGNOSIS — E118 Type 2 diabetes mellitus with unspecified complications: Secondary | ICD-10-CM

## 2017-08-20 DIAGNOSIS — E1159 Type 2 diabetes mellitus with other circulatory complications: Secondary | ICD-10-CM | POA: Diagnosis not present

## 2017-08-20 DIAGNOSIS — E669 Obesity, unspecified: Secondary | ICD-10-CM

## 2017-08-20 DIAGNOSIS — E1165 Type 2 diabetes mellitus with hyperglycemia: Secondary | ICD-10-CM

## 2017-08-20 DIAGNOSIS — IMO0002 Reserved for concepts with insufficient information to code with codable children: Secondary | ICD-10-CM

## 2017-08-20 NOTE — Progress Notes (Signed)
  Medical Nutrition Therapy:  Appt start time: 1330 end time:  1430.   Assessment:  Primary concerns today: Diabetes. In cardiac rehab.  Heart attack June 2016-didn't know he had it. June 6th had another heart attack 2016, August 26th 2018 had a heart attack.  H/o drug abuse in years past. No longer doing drugs or alcohol. Still smoking- has cut down to 1/2 pack a day down from 1 1/2 packs. Going through Cardiac Rehab.. Alllergica to lisinopril causing throat closing. Gained 60 lbs in the last 2 years after first heart attack.   Lab Results  Component Value Date   HGBA1C 7.6 (H) 04/15/2017   Lipid Panel     Component Value Date/Time   CHOL 93 04/27/2017 0505   TRIG 206 (H) 04/27/2017 0505   HDL 18 (L) 04/27/2017 0505   CHOLHDL 5.2 04/27/2017 0505   VLDL 41 (H) 04/27/2017 0505   LDLCALC 34 04/27/2017 0505    Checking BS three times per week. Metformin 1000 mg BID. FBS: 120-140's.   Diet remains higher in fat and sodium and calories. Working on losing weight through cardiac rehab and eating better.  Motivated and engaged to make changes.   Preferred Learning Style:   No preference indicated   Learning Readiness:   Ready  Change in progress   MEDICATIONS: See chart   DIETARY INTAKE:  24-hr recall:  B ( AM): 2 boiled eggs, 1 packet instant oatmeal, sausage, t slice toast, water Snk ( AM):  reece cup L ( PM): egg salad sandwich, pringle chips, halo, Cranberry juice Snk ( PM): pringle chips D ( PM): Spaghetti, salad, ranch dressing, rye bread, sugar cookie, chocolate chip cookie, twix bar, lemonade Snk ( PM):  Beverages: little water, juice.  Usual physical activity: ADL.  Estimated energy needs: 1800  calories 200 g carbohydrates 135 g protein 50 g fat  Progress Towards Goal(s):  In progress.   Nutritional Diagnosis:  NB-1.1 Food and nutrition-related knowledge deficit As related to Diabetes.  As evidenced by A1C 7.6%.    Intervention: Nutrition and  Diabetes education provided on My Plate, CHO counting, meal planning, portion sizes, timing of meals, avoiding snacks between meals unless having a low blood sugar, target ranges for A1C and blood sugars, signs/symptoms and treatment of hyper/hypoglycemia, monitoring blood sugars, taking medications as prescribed, benefits of exercising 30 minutes per day and prevention of complications of DM.   Teaching Method Utilized:  Visual Auditory Hands on  Handouts given during visit include:  The Plate Method  Meal Plan Card  Diabetes Instructions.   Barriers to learning/adherence to lifestyle change: none  Demonstrated degree of understanding via:  Teach Back   Monitoring/Evaluation:  Dietary intake, exercise, meal planning, SBG, and body weight in 2 month(s).

## 2017-08-20 NOTE — Patient Instructions (Signed)
Goal 1. Follow My Plate 2.  Drink 5 bottles of water per day 3. Cut out fried, processed foods, sausage, cheese, bacon, 4. Increase fresh fruits and vegetables. 5. Cut out smoking and drinking. 6. Walk 30 minutes a day. 7.Lose 1-2 lbs per week.

## 2017-08-21 ENCOUNTER — Encounter (HOSPITAL_COMMUNITY)
Admission: RE | Admit: 2017-08-21 | Discharge: 2017-08-21 | Disposition: A | Discharge status: Home / Self Care | Payer: Medicaid Other | Source: Ambulatory Visit | Attending: Cardiology | Admitting: Cardiology

## 2017-08-21 DIAGNOSIS — I214 Non-ST elevation (NSTEMI) myocardial infarction: Secondary | ICD-10-CM

## 2017-08-21 DIAGNOSIS — Z955 Presence of coronary angioplasty implant and graft: Secondary | ICD-10-CM

## 2017-08-21 NOTE — Progress Notes (Addendum)
Daily Session Note  Patient Details  Name: Randy Hebert MRN: [id number] Date of Birth: 08/15/1966 Referring Provider:     [place] from 06/22/2017 in [place]  Referring Provider  Dr. [name]      Encounter Date: 08/21/2017  Check In: Session Check In - 08/21/17 0930      Check-In   Location  [facility]    Staff Present  Aundra Dubin, RN, BSN;Diane Coad, MS, EP, [facility], Exercise Physiologist    Supervising physician immediately available to respond to emergencies  See telemetry face sheet for immediately available MD    Medication changes reported      No    Fall or balance concerns reported     No    Tobacco Cessation  No Change    Warm-up and Cool-down  Performed as group-led instruction    Resistance Training Performed  Yes    VAD Patient?  No      Pain Assessment   Currently in Pain?  No/denies    Pain Score  0-No pain    Multiple Pain Sites  No       Capillary Blood Glucose: No results found for this or any previous visit (from the past 24 hour(s)).    Social History   Tobacco Use  Smoking Status Current Every Day Smoker  . Packs/day: 1.00  . Years: 46.00  . Pack years: 46.00  . Types: Cigarettes  . Start date: 06/07/1979  Smokeless Tobacco Former Systems developer  . Types: Chew    Goals Met:  Independence with exercise equipment Exercise tolerated well No report of cardiac concerns or symptoms Strength training completed today  Goals Unmet:  Not Applicable  Comments: Check out 915.   Dr. Kate Sable is Medical Director for [facility] Card[facility].

## 2017-08-24 ENCOUNTER — Encounter (HOSPITAL_COMMUNITY): Payer: Medicaid Other

## 2017-08-24 NOTE — Progress Notes (Signed)
Cardiology Office Note   Date:  08/28/2017   ID:  Randy Hebert, Randy Hebert 10/04/1965, MRN [id number]  PCP:  Raylene Everts, MD  Cardiologist:   Crissie Sickles, MD Chief Complaint  Patient presents with  . Coronary Artery Disease  . Follow-up     History of Present Illness: Randy Hebert is a 51 y.o. male who presents for ongoing assessment and management of coronary artery disease, COPD on home O2,, history of hyperlipidemia, diabetes, OSA on CPAP, and obesity.  Patient was last seen in the office on 07/03/2017, after having EP study which demonstrated no inducible VT and no evidence of baseline severe conduction system disease.  The patient now has a Medtronic ILR which was placed in the setting of unexplained syncope.  The patient also had a nuclear medicine stress test which was low risk for ischemia..  On last office visit the patient continued to have episodes of dizziness and "foggy feeling".  Nuclear medicine stress test revealed multiple pauses some up to 7.2 seconds, he was referred to Dr. Lovena Le for pacemaker implantation.  He was found to have pauses mostly during nocturnal, and was asymptomatic.  He was to undergo watchful waiting, and pacemaker was not found to be necessary at that time.  Patient comes today without any new symptoms.  He is struggling with smoking cessation and plans to quit on New Year's Eve.  He is down to a half a pack a day.  He states his family and girlfriend are giving him a hard time about continuing to smoke and he is ready to quit.  He does have nicotine patches.  Syncope palpitations or dizziness.  He does have an ILR in situ, and has a activation device that will alert when he feels lightheaded or dizzy.  He has not had to use it during the day but does have some events at night.  He states often the company will call him to check on him because they noticed that his heart rate becomes slower.  He is asymptomatic with this.  Past Medical History:    Diagnosis Date  . Allergy   . Anxiety   . Arthritis   . Asthma    hip replacement  . Back pain   . Bulging of cervical intervertebral disc   . CAD (coronary artery disease)    lateral STEMI 02/06/2015 00% D1 occlusion treated with Promus Premier 2.5 mm x 16 mm DES, 70% ramus stenosis, 40% mid RCA stenosis, 45% distal RCA stenosis, EF 45-50%  . CHF (congestive heart failure) ([place])   . COPD (chronic obstructive pulmonary disease) ([place])   . Depression   . Diabetes mellitus without complication ([place])   . 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 Dry eye   . Dyspnea   . Early satiety 09/23/2016  . GERD (gastroesophageal reflux disease)   . Headache   . Heart murmur   . Hip pain   . Hyperlipidemia   . Hypertension   . MI (myocardial infarction) ([place])   . Myocardial infarction ([place])   . Neck pain   . Otitis media   . Pleurisy   . Sinus pause    9 sec sinus pause on telemetry after started on coreg after MI, avoid AV nodal blocking agent  . Sleep apnea   . 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 Unilateral primary osteoarthritis, right hip 07/01/2016    Past Surgical History:  Procedure Laterality Date  . BIOPSY  10/09/2016   Procedure: BIOPSY;  Surgeon: Daneil Dolin, MD;  [facility];  Service: Endoscopy;;  . CARDIAC CATHETERIZATION N/A 02/06/2015   Procedure: Left Heart Cath and Coronary Angiography;  Surgeon: Leonie Man, MD;  Location: [facility];  Service: Cardiovascular;  Laterality: N/A;  . CARDIAC CATHETERIZATION N/A 02/06/2015   Procedure: Coronary Stent Intervention;  Surgeon: Leonie Man, MD;  Location: [facility];  Service: Cardiovascular;  Laterality: N/A;  . COLONOSCOPY WITH PROPOFOL N/A 10/09/2016   Procedure: COLONOSCOPY WITH PROPOFOL;  Surgeon: Daneil Dolin, MD;  [facility];  Service: Endoscopy;  Laterality: N/A;  12:00 pm  . COLONOSCOPY WITH PROPOFOL N/A 11/24/2016   Procedure:  COLONOSCOPY WITH PROPOFOL;  Surgeon: Daneil Dolin, MD;  [facility];  Service: Endoscopy;  Laterality: N/A;  200  . CORONARY ANGIOPLASTY WITH STENT PLACEMENT  01/2015  . CORONARY STENT INTERVENTION N/A 04/27/2017   Procedure: CORONARY STENT INTERVENTION;  Surgeon: Nelva Bush, MD;  Location: [facility];  Service: Cardiovascular;  Laterality: N/A;  . ELECTROPHYSIOLOGY STUDY N/A 06/29/2017   Procedure: ELECTROPHYSIOLOGY STUDY;  Surgeon: Evans Lance, MD;  Location: [facility];  Service: Cardiovascular;  Laterality: N/A;  . ESOPHAGOGASTRODUODENOSCOPY (EGD) WITH PROPOFOL N/A 10/09/2016   Procedure: ESOPHAGOGASTRODUODENOSCOPY (EGD) WITH PROPOFOL;  Surgeon: Daneil Dolin, MD;  [facility];  Service: Endoscopy;  Laterality: N/A;  . INCISION / DRAINAGE HAND / FINGER    . LEFT HEART CATH AND CORONARY ANGIOGRAPHY N/A 04/27/2017   Procedure: LEFT HEART CATH AND CORONARY ANGIOGRAPHY;  Surgeon: Nelva Bush, MD;  Location: [facility];  Service: Cardiovascular;  Laterality: N/A;  . LOOP RECORDER INSERTION  06/29/2017   Procedure: Loop Recorder Insertion;  Surgeon: Evans Lance, MD;  Location: [facility];  Service: Cardiovascular;;  . POLYPECTOMY  11/24/2016   Procedure: POLYPECTOMY;  Surgeon: Daneil Dolin, MD;  [facility];  Service: Endoscopy;;  descending and sigmoid  . TOTAL HIP ARTHROPLASTY Right 07/01/2016  . TOTAL HIP ARTHROPLASTY Right 07/01/2016   Procedure: RIGHT TOTAL HIP ARTHROPLASTY ANTERIOR APPROACH;  Surgeon: Mcarthur Rossetti, MD;  Location: [facility];  Service: Orthopedics;  Laterality: Right;     Current Outpatient Medications  Medication Sig Dispense Refill  . acetaminophen (TYLENOL) 325 MG tablet Take 650 mg by mouth every 6 (six) hours as needed for mild pain.    Marland Kitchen albuterol (PROVENTIL) (2.5 MG/3ML) 0.083% nebulizer solution Take 3 mLs (2.5 mg total) by nebulization every 6 (six) hours as needed for  wheezing or shortness of breath. 150 mL 1  . aspirin 81 MG EC tablet Take 1 tablet (81 mg total) by mouth daily. 30 tablet   . atorvastatin (LIPITOR) 80 MG tablet Take 80 mg by mouth daily.    . Carboxymeth-Glycerin-Polysorb (REFRESH OPTIVE ADVANCED) 0.5-1-0.5 % SOLN Apply 1 drop to eye 2 (two) times daily as needed (dry eyes).     . citalopram (CELEXA) 20 MG tablet Take 20 mg daily by mouth.    . cycloSPORINE (RESTASIS) 0.05 % ophthalmic emulsion Place 1 drop into both eyes 2 (two) times daily.    . furosemide (LASIX) 40 MG tablet Take 1 tablet (40 mg total) by mouth daily. 90 tablet 3  . glucose blood (IGLUCOSE TEST STRIPS) test strip 1 each by Other route 2 (two) times daily. Use as instructed 100 each 12  .  Magnesium 200 MG TABS Take 1 tablet (200 mg total) by mouth daily. 30 each 6  . metFORMIN (GLUCOPHAGE) 1000 MG tablet Take 1 tablet (1,000 mg total) by mouth 2 (two) times daily with a meal. 180 tablet 3  . mometasone-formoterol (DULERA) 100-5 MCG/ACT AERO Inhale 2 puffs into the lungs 2 (two) times daily. 3 Inhaler 3  . nicotine (NICODERM CQ - DOSED IN MG/24 HOURS) 21 mg/24hr patch Place 1 patch (21 mg total) onto the skin daily. (Patient taking differently: Place 21 mg onto the skin daily. ) 28 patch 0  . nitroGLYCERIN (NITROSTAT) 0.4 MG SL tablet Place 1 tablet (0.4 mg total) under the tongue every 5 (five) minutes as needed for chest pain. 25 tablet 3  . Omega-3 Fatty Acids (FISH OIL) 1200 MG CAPS Take 1,200 mg by mouth 2 (two) times daily.    . pantoprazole (PROTONIX) 40 MG tablet Take 1 tablet (40 mg total) by mouth daily. 30 tablet 11  . potassium chloride SA (K-DUR,KLOR-CON) 20 MEQ tablet Take 1 tablet (20 mEq total) by mouth daily. 30 tablet 3  . prasugrel (EFFIENT) 10 MG TABS tablet Take 1 tablet (10 mg total) by mouth daily. 90 tablet 1  . PROVENTIL HFA 108 (90 Base) MCG/ACT inhaler INHALE 2 PUFFS INTO THE LUNGS EVERY 6 HOURS AS NEEDED FOR SHORTNESS OF BREATH OR WHEEZING. 18 g 1    . traZODone (DESYREL) 100 MG tablet Take 100 mg by mouth at bedtime.      No current facility-administered medications for this visit.     Allergies:   Carvedilol; Lisinopril; and Amoxicillin    Social History:  The patient  reports that he has been smoking cigarettes.  He started smoking about 38 years ago. He has a 46.00 pack-year smoking history. He has quit using smokeless tobacco. His smokeless tobacco use included chew. He reports that he drinks about 6.6 - 7.2 oz of alcohol per week. He reports that he does not use drugs.   Family History:  The patient's family history includes Alzheimer's disease in his maternal grandmother; Arthritis in his father and mother; Cancer in his mother; Diabetes in his maternal uncle; Early death in his brother; Heart attack in his father; Heart disease in his father; Heart disease (age of onset: 19) in his brother; Stroke in his father.    ROS: All other systems are reviewed and negative. Unless otherwise mentioned in H&P    PHYSICAL EXAM: VS:  BP (!) 150/88 (BP Location: Right Arm)   Pulse 92   Ht 5\' 6"  (1.676 m)   Wt 224 lb (101.6 kg)   SpO2 93%   BMI 36.15 kg/m  , BMI Body mass index is 36.15 kg/m. GEN: Well nourished, well developed, in no acute distress  HEENT: normal  Neck: no JVD, carotid bruits, or masses Cardiac: RRR; no murmurs, rubs, or gallops,no edema  Respiratory:  clear to auscultation bilaterally, normal work of breathing, obese GI: soft, nontender, nondistended, + BS MS: no deformity or atrophy  Skin: warm and dry, no rash Neuro:  Strength and sensation are intact Psych: euthymic mood, full affect   Recent Labs: 04/15/2017: ALT 38; Brain Natriuretic Peptide 32.6 04/26/2017: TSH 1.635 05/16/2017: Magnesium 1.8 06/25/2017: BUN 10; Creat 0.78; Hemoglobin 13.9; Platelets 329; Potassium 4.3; Sodium 136    Lipid Panel    Component Value Date/Time   CHOL 93 04/27/2017 0505   TRIG 206 (H) 04/27/2017 0505   HDL 18 (L)  04/27/2017 0505  CHOLHDL 5.2 04/27/2017 0505   VLDL 41 (H) 04/27/2017 0505   LDLCALC 34 04/27/2017 0505      Wt Readings from Last 3 Encounters:  08/28/17 224 lb (101.6 kg)  08/20/17 224 lb (101.6 kg)  08/17/17 227 lb (103 kg)      Other studies Reviewed: Cardiac Cath 04/27/2017 Conclusion   Conclusions: 1. Severe single-vessel coronary artery disease with subtotal thrombotic occlusion of proximal ramus intermedius with TIMI-1 flow. 2. Mild to moderate disease involving the LAD and RCA, which are large and ectatic vessels. Previously placed diagonal stent is widely patent. 3. Moderately reduced LVEF with mid and apical anterolateral hypokinesis. 4. Mildly elevated left ventricular filling pressure. 5. Successful PCI to proximal ramus intermedius with placement of a Xience Sierra 2.75 x 15 mm drug-eluting stent (post dilated to 3.1 mm) with 0% residual stenosis and TIMI-3 flow.  Recommendations: 1. Continue tirofiban for 4 hours post procedure. 2. Dual antiplatelet therapy with aspirin and present growth for at least 12 months (patient has history of intolerance to ticagrelor). 3. Aggressive secondary prevention and medical therapy of CAD and ischemic cardiomyopathy with moderately reduced LVEF. Smoking cessation strongly encouraged.   Echocardiogram 04/27/2017 Left ventricle: The cavity size was normal. There was mild concentric hypertrophy. Systolic function was mildly to moderately reduced. The estimated ejection fraction was in the range of 40% to 45%. Diffuse hypokinesis. Doppler parameters are consistent with abnormal left ventricular relaxation (grade 1 diastolic dysfunction). Doppler parameters are consistent with elevated ventricular end-diastolic filling pressure. - Ventricular septum: Septal motion showed paradox. - Aortic valve: Trileaflet; normal thickness leaflets. There was no regurgitation. - Mitral valve: Structurally normal valve. There was  trivial regurgitation. - Right ventricle: The cavity size was moderately dilated. Wall thickness was normal. Systolic function was moderately reduced. - Right atrium: The atrium was normal in size. - Tricuspid valve: There was trivial regurgitation. - Pulmonary arteries: Systolic pressure was within the normal range. - Inferior vena cava: The vessel was dilated. The respirophasic diameter changes were blunted (<50%), consistent with elevated central venous pressure. - Pericardium, extracardiac: There was no pericardial effusion.   ASSESSMENT AND PLAN:  1. CAD: No new complaints of recurrent chest pain, dyspnea on exertion, or fatigue.  Patient will continue on secondary prevention with statin therapy, aspirin, not on beta-blocker in the setting of documented bradycardia with pauses.  Continue pressor grill 10 mg daily blood pressure slightly elevated may need to consider adding ACE inhibitor on next office visit if this remains so.  The patient is just finished smoking a cigarette.  2.  Chronic systolic heart failure: Has reduced ejection fraction of 40% to 45%.  He remains on Lasix 40 mg daily.  He is actually losing weight as he is exercising more at cardiac rehab and walking his dog daily.  He denies any fluid retention or PND.  Follow-up labs are being completed by his primary care physician Dr. Meda Coffee.  3.  Ongoing tobacco abuse: Patient is making a valiant effort to quit.  He is down to half a pack a day, has nicotine patch which he is using also.  He plans to quit cold [place] on December 31 st, I encouraged him on this endeavor.  4.  Diabetes: Followed by primary care physician Dr. Meda Coffee.   Current medicines are reviewed at length with the patient today.    Labs/ tests ordered today include:  Phill Myron. [place] Pugh, ANP, AACC   08/28/2017 2:23 PM    [facility] [address], [place], [place] [address] Phone: [phone]; Fax: [phone]

## 2017-08-26 ENCOUNTER — Encounter (HOSPITAL_COMMUNITY): Payer: Medicaid Other

## 2017-08-28 ENCOUNTER — Encounter (HOSPITAL_COMMUNITY): Payer: Medicaid Other

## 2017-08-28 ENCOUNTER — Encounter: Payer: Self-pay | Admitting: Adult Health

## 2017-08-28 ENCOUNTER — Ambulatory Visit (INDEPENDENT_AMBULATORY_CARE_PROVIDER_SITE_OTHER): Payer: Medicaid Other | Admitting: Adult Health

## 2017-08-28 ENCOUNTER — Ambulatory Visit (INDEPENDENT_AMBULATORY_CARE_PROVIDER_SITE_OTHER): Payer: Medicaid Other | Admitting: *Deleted

## 2017-08-28 VITALS — BP 150/88 | HR 92 | Ht 66.0 in | Wt 224.0 lb

## 2017-08-28 DIAGNOSIS — I10 Essential (primary) hypertension: Secondary | ICD-10-CM

## 2017-08-28 DIAGNOSIS — Z720 Tobacco use: Secondary | ICD-10-CM | POA: Diagnosis not present

## 2017-08-28 DIAGNOSIS — I5042 Chronic combined systolic (congestive) and diastolic (congestive) heart failure: Secondary | ICD-10-CM

## 2017-08-28 DIAGNOSIS — I2510 Atherosclerotic heart disease of native coronary artery without angina pectoris: Secondary | ICD-10-CM | POA: Diagnosis not present

## 2017-08-28 DIAGNOSIS — R55 Syncope and collapse: Secondary | ICD-10-CM | POA: Diagnosis not present

## 2017-08-28 NOTE — Patient Instructions (Signed)

## 2017-08-31 ENCOUNTER — Encounter (HOSPITAL_COMMUNITY)
Admission: RE | Admit: 2017-08-31 | Discharge: 2017-08-31 | Disposition: A | Discharge status: Home / Self Care | Payer: Medicaid Other | Source: Ambulatory Visit | Attending: Cardiology | Admitting: Cardiology

## 2017-08-31 DIAGNOSIS — Z955 Presence of coronary angioplasty implant and graft: Secondary | ICD-10-CM

## 2017-08-31 DIAGNOSIS — I214 Non-ST elevation (NSTEMI) myocardial infarction: Secondary | ICD-10-CM | POA: Diagnosis not present

## 2017-08-31 NOTE — Progress Notes (Signed)
Daily Session Note  Patient Details  Name: Randy Hebert MRN: [id number] Date of Birth: 1966-06-11 Referring Provider:     CARDIAC REHAB PHASE II ORIENTATION from 06/22/2017 in [place]  Referring Provider  Dr. [name]      Encounter Date: 08/31/2017  Check In: Session Check In - 08/31/17 0835      Check-In   Location  [facility]    Staff Present  Diane Angelina Pih, MS, EP, [facility], Exercise Physiologist;Kingsley Farace Luther Parody, BS, EP, Exercise Physiologist;Debra Wynetta Emery, RN, BSN    Supervising physician immediately available to respond to emergencies  See telemetry face sheet for immediately available MD    Medication changes reported      No    Fall or balance concerns reported     No    Warm-up and Cool-down  Performed as group-led instruction    Resistance Training Performed  Yes    VAD Patient?  No      Pain Assessment   Currently in Pain?  No/denies    Pain Score  0-No pain    Multiple Pain Sites  No       Capillary Blood Glucose: No results found for this or any previous visit (from the past 24 hour(s)).    Social History   Tobacco Use  Smoking Status Current Every Day Smoker  . Packs/day: 1.00  . Years: 46.00  . Pack years: 46.00  . Types: Cigarettes  . Start date: 06/07/1979  Smokeless Tobacco Former Systems developer  . Types: Chew    Goals Met:  Independence with exercise equipment Exercise tolerated well No report of cardiac concerns or symptoms Strength training completed today  Goals Unmet:  Not Applicable  Comments: Check out 915   Dr. Kate Sable is Medical Director for [facility] and Pulmonary Rehab.

## 2017-08-31 NOTE — Progress Notes (Signed)
[facility] Summary Report / Loop Recorder 

## 2017-09-02 ENCOUNTER — Encounter (HOSPITAL_COMMUNITY)
Admission: RE | Admit: 2017-09-02 | Discharge: 2017-09-02 | Disposition: A | Discharge status: Home / Self Care | Payer: Medicaid Other | Source: Ambulatory Visit | Attending: Cardiology | Admitting: Cardiology

## 2017-09-02 DIAGNOSIS — Z955 Presence of coronary angioplasty implant and graft: Secondary | ICD-10-CM

## 2017-09-02 DIAGNOSIS — F17210 Nicotine dependence, cigarettes, uncomplicated: Secondary | ICD-10-CM | POA: Diagnosis not present

## 2017-09-02 DIAGNOSIS — I214 Non-ST elevation (NSTEMI) myocardial infarction: Secondary | ICD-10-CM

## 2017-09-02 NOTE — Progress Notes (Addendum)
Daily Session Note  Patient Details  Name: Randy Hebert MRN: [id number] Date of Birth: Apr 15, 1966 Referring Provider:     CARDIAC REHAB PHASE II ORIENTATION from 06/22/2017 in [facility]  Referring Provider  Dr. [name]      Encounter Date: 09/02/2017  Check In: Session Check In - 09/02/17 0813      Check-In   Location  [facility]    Staff Present  Diane Angelina Pih, MS, EP, [facility], Exercise Physiologist;Matteson Blue Luther Parody, BS, EP, Exercise Physiologist;Debra Wynetta Emery, RN, BSN    Supervising physician immediately available to respond to emergencies  See telemetry face sheet for immediately available MD    Medication changes reported      No    Fall or balance concerns reported     No    Warm-up and Cool-down  Performed as group-led instruction    Resistance Training Performed  Yes    VAD Patient?  No      Pain Assessment   Currently in Pain?  No/denies    Pain Score  0-No pain    Multiple Pain Sites  No       Capillary Blood Glucose: No results found for this or any previous visit (from the past 24 hour(s)).  Exercise Prescription Changes - 09/02/17 0700      Response to Exercise   Blood Pressure (Admit)  142/80    Blood Pressure (Exercise)  168/70    Blood Pressure (Exit)  128/74    Heart Rate (Admit)  83 bpm    Heart Rate (Exercise)  106 bpm    Heart Rate (Exit)  90 bpm    Rating of Perceived Exertion (Exercise)  10    Duration  Progress to 30 minutes of  aerobic without signs/symptoms of physical distress    Intensity  THRR New [phone]      Progression   Progression  Continue to progress workloads to maintain intensity without signs/symptoms of physical distress.      Resistance Training   Training Prescription  Yes    Weight  3    Reps  10-15      Treadmill   MPH  1.5    Grade  0    Minutes  15    METs  2.1      NuStep   Level  3    SPM  112    Minutes  20    METs  3.7      Home Exercise Plan   Plans to continue  exercise at  Home (comment)    Frequency  Add 2 additional days to program exercise sessions.    Initial Home Exercises Provided  07/08/17       Social History   Tobacco Use  Smoking Status Current Every Day Smoker  . Packs/day: 1.00  . Years: 46.00  . Pack years: 46.00  . Types: Cigarettes  . Start date: 06/07/1979  Smokeless Tobacco Former Systems developer  . Types: Chew    Goals Met:  Independence with exercise equipment Exercise tolerated well No report of cardiac concerns or symptoms Strength training completed today  Goals Unmet:  Not Applicable  Comments: Check out 915. Patient states that he will attempt to quit smoking as of today.    Dr. Kate Sable is Medical Director for [facility] and Pulmonary Rehab.

## 2017-09-04 ENCOUNTER — Encounter (HOSPITAL_COMMUNITY): Payer: Medicaid Other

## 2017-09-07 ENCOUNTER — Encounter (HOSPITAL_COMMUNITY)
Admission: RE | Admit: 2017-09-07 | Discharge: 2017-09-07 | Disposition: A | Discharge status: Home / Self Care | Payer: Medicaid Other | Source: Ambulatory Visit | Attending: Cardiology | Admitting: Cardiology

## 2017-09-07 DIAGNOSIS — Z955 Presence of coronary angioplasty implant and graft: Secondary | ICD-10-CM

## 2017-09-07 DIAGNOSIS — I214 Non-ST elevation (NSTEMI) myocardial infarction: Secondary | ICD-10-CM

## 2017-09-07 LAB — CUP PACEART REMOTE DEVICE CHECK
Date Time Interrogation Session: 20181228211048
MDC IDC PG IMPLANT DT: 20181029

## 2017-09-07 NOTE — Progress Notes (Signed)
Daily Session Note  Patient Details  Name: Randy Hebert MRN: [id number] Date of Birth: 1966-06-13 Referring Provider:     CARDIAC REHAB PHASE II ORIENTATION from 06/22/2017 in [place]  Referring Provider  Dr. [name]      Encounter Date: 09/07/2017  Check In: Session Check In - 09/07/17 0825      Check-In   Location  [facility]    Staff Present  Suzanne Boron, BS, EP, Exercise Physiologist;Debra Wynetta Emery, RN, BSN    Supervising physician immediately available to respond to emergencies  See telemetry face sheet for immediately available MD    Medication changes reported      No    Fall or balance concerns reported     No    Warm-up and Cool-down  Performed as group-led instruction    Resistance Training Performed  Yes    VAD Patient?  No      Pain Assessment   Currently in Pain?  No/denies    Pain Score  0-No pain    Multiple Pain Sites  No       Capillary Blood Glucose: No results found for this or any previous visit (from the past 24 hour(s)).    Social History   Tobacco Use  Smoking Status Current Every Day Smoker  . Packs/day: 1.00  . Years: 46.00  . Pack years: 46.00  . Types: Cigarettes  . Start date: 06/07/1979  Smokeless Tobacco Former Systems developer  . Types: Chew    Goals Met:  Independence with exercise equipment Exercise tolerated well No report of cardiac concerns or symptoms Strength training completed today  Goals Unmet:  Not Applicable  Comments: Check out 915   Dr. Kate Sable is Medical Director for [facility] and Pulmonary Rehab.

## 2017-09-08 ENCOUNTER — Encounter: Payer: Self-pay | Admitting: Internal Medicine

## 2017-09-09 ENCOUNTER — Encounter (HOSPITAL_COMMUNITY)
Admission: RE | Admit: 2017-09-09 | Discharge: 2017-09-09 | Disposition: A | Discharge status: Home / Self Care | Payer: Medicaid Other | Source: Ambulatory Visit | Attending: Cardiology | Admitting: Cardiology

## 2017-09-09 DIAGNOSIS — I214 Non-ST elevation (NSTEMI) myocardial infarction: Secondary | ICD-10-CM

## 2017-09-09 DIAGNOSIS — Z955 Presence of coronary angioplasty implant and graft: Secondary | ICD-10-CM

## 2017-09-09 NOTE — Progress Notes (Signed)
Daily Session Note  Patient Details  Name: Randy Hebert MRN: [id number] Date of Birth: 10-Jan-1966 Referring Provider:     CARDIAC REHAB PHASE II ORIENTATION from 06/22/2017 in [place]  Referring Provider  Dr. [name]      Encounter Date: 09/09/2017  Check In: Session Check In - 09/09/17 0823      Check-In   [facility]    Staff Present  Suzanne Boron, BS, EP, Exercise Physiologist;Debra Wynetta Emery, RN, BSN    Supervising physician immediately available to respond to emergencies  See telemetry face sheet for immediately available MD    Medication changes reported      No    Fall or balance concerns reported     No    Warm-up and Cool-down  Performed as group-led instruction    Resistance Training Performed  Yes    VAD Patient?  No      Pain Assessment   Currently in Pain?  No/denies    Pain Score  0-No pain    Multiple Pain Sites  No       Capillary Blood Glucose: No results found for this or any previous visit (from the past 24 hour(s)).    Social History   Tobacco Use  Smoking Status Current Every Day Smoker  . Packs/day: 1.00  . Years: 46.00  . Pack years: 46.00  . Types: Cigarettes  . Start date: 06/07/1979  Smokeless Tobacco Former Systems developer  . Types: Chew    Goals Met:  Independence with exercise equipment Exercise tolerated well No report of cardiac concerns or symptoms Strength training completed today  Goals Unmet:  Not Applicable  Comments: Check out 915   Dr. Kate Sable is Medical Director for [facility] and Pulmonary Rehab.

## 2017-09-11 ENCOUNTER — Encounter (HOSPITAL_COMMUNITY)
Admission: RE | Admit: 2017-09-11 | Discharge: 2017-09-11 | Disposition: A | Discharge status: Home / Self Care | Payer: Medicaid Other | Source: Ambulatory Visit | Attending: Cardiology | Admitting: Cardiology

## 2017-09-11 DIAGNOSIS — I214 Non-ST elevation (NSTEMI) myocardial infarction: Secondary | ICD-10-CM | POA: Diagnosis not present

## 2017-09-11 DIAGNOSIS — Z955 Presence of coronary angioplasty implant and graft: Secondary | ICD-10-CM

## 2017-09-11 NOTE — Progress Notes (Signed)
Daily Session Note  Patient Details  Name: OLANDO WILLEMS MRN: [id number] Date of Birth: April 24, 1966 Referring Provider:     CARDIAC REHAB PHASE II ORIENTATION from 06/22/2017 in [place]  Referring Provider  Dr. [name]      Encounter Date: 09/11/2017  Check In: Session Check In - 09/11/17 0821      Check-In   [facility]    Staff Present  Suzanne Boron, BS, EP, Exercise Physiologist;Debra Wynetta Emery, RN, BSN    Supervising physician immediately available to respond to emergencies  See telemetry face sheet for immediately available MD    Medication changes reported      No    Fall or balance concerns reported     No    Warm-up and Cool-down  Performed as group-led instruction    Resistance Training Performed  Yes    VAD Patient?  No      Pain Assessment   Currently in Pain?  No/denies    Pain Score  0-No pain    Multiple Pain Sites  No       Capillary Blood Glucose: No results found for this or any previous visit (from the past 24 hour(s)).    Social History   Tobacco Use  Smoking Status Current Every Day Smoker  . Packs/day: 1.00  . Years: 46.00  . Pack years: 46.00  . Types: Cigarettes  . Start date: 06/07/1979  Smokeless Tobacco Former Systems developer  . Types: Chew    Goals Met:  Independence with exercise equipment Exercise tolerated well No report of cardiac concerns or symptoms Strength training completed today  Goals Unmet:  Not Applicable  Comments: Check ot 915   Dr. Kate Sable is Medical Director for [facility] and Pulmonary Rehab.

## 2017-09-14 ENCOUNTER — Encounter (HOSPITAL_COMMUNITY)
Admission: RE | Admit: 2017-09-14 | Discharge: 2017-09-14 | Disposition: A | Discharge status: Home / Self Care | Payer: Medicaid Other | Source: Ambulatory Visit | Attending: Cardiology | Admitting: Cardiology

## 2017-09-14 DIAGNOSIS — Z955 Presence of coronary angioplasty implant and graft: Secondary | ICD-10-CM

## 2017-09-14 DIAGNOSIS — I214 Non-ST elevation (NSTEMI) myocardial infarction: Secondary | ICD-10-CM

## 2017-09-14 NOTE — Progress Notes (Signed)
Cardiac Individual Treatment Plan  Patient Details  Name: Randy Hebert MRN: [id number] Date of Birth: 04-Nov-1965 Referring Provider:     CARDIAC REHAB [place] from 06/22/2017 in [place]  Referring Provider  Dr. [name]      Initial Encounter Date:    CARDIAC REHAB PHASE II ORIENTATION from 06/22/2017 in [place]  Date  06/22/17  Referring Provider  Dr. [name]      Visit Diagnosis: NSTEMI (non-ST elevated myocardial infarction) [facility])  Status post coronary artery stent placement  Patient's Home Medications on Admission:  Current Outpatient Medications:  .  acetaminophen (TYLENOL) 325 MG tablet, Take 650 mg by mouth every 6 (six) hours as needed for mild pain., Disp: , Rfl:  .  albuterol (PROVENTIL) (2.5 MG/3ML) 0.083% nebulizer solution, Take 3 mLs (2.5 mg total) by nebulization every 6 (six) hours as needed for wheezing or shortness of breath., Disp: 150 mL, Rfl: 1 .  aspirin 81 MG EC tablet, Take 1 tablet (81 mg total) by mouth daily., Disp: 30 tablet, Rfl:  .  atorvastatin (LIPITOR) 80 MG tablet, Take 80 mg by mouth daily., Disp: , Rfl:  .  Carboxymeth-Glycerin-Polysorb (REFRESH OPTIVE ADVANCED) 0.5-1-0.5 % SOLN, Apply 1 drop to eye 2 (two) times daily as needed (dry eyes). , Disp: , Rfl:  .  citalopram (CELEXA) 20 MG tablet, Take 20 mg daily by mouth., Disp: , Rfl:  .  cycloSPORINE (RESTASIS) 0.05 % ophthalmic emulsion, Place 1 drop into both eyes 2 (two) times daily., Disp: , Rfl:  .  furosemide (LASIX) 40 MG tablet, Take 1 tablet (40 mg total) by mouth daily., Disp: 90 tablet, Rfl: 3 .  glucose blood (IGLUCOSE TEST STRIPS) test strip, 1 each by Other route 2 (two) times daily. Use as instructed, Disp: 100 each, Rfl: 12 .  Magnesium 200 MG TABS, Take 1 tablet (200 mg total) by mouth daily., Disp: 30 each, Rfl: 6 .  metFORMIN (GLUCOPHAGE) 1000 MG tablet, Take 1 tablet (1,000 mg total) by mouth 2 (two) times daily  with a meal., Disp: 180 tablet, Rfl: 3 .  mometasone-formoterol (DULERA) 100-5 MCG/ACT AERO, Inhale 2 puffs into the lungs 2 (two) times daily., Disp: 3 Inhaler, Rfl: 3 .  nicotine (NICODERM CQ - DOSED IN MG/24 HOURS) 21 mg/24hr patch, Place 1 patch (21 mg total) onto the skin daily. (Patient taking differently: Place 21 mg onto the skin daily. ), Disp: 28 patch, Rfl: 0 .  nitroGLYCERIN (NITROSTAT) 0.4 MG SL tablet, Place 1 tablet (0.4 mg total) under the tongue every 5 (five) minutes as needed for chest pain., Disp: 25 tablet, Rfl: 3 .  Omega-3 Fatty Acids (FISH OIL) 1200 MG CAPS, Take 1,200 mg by mouth 2 (two) times daily., Disp: , Rfl:  .  pantoprazole (PROTONIX) 40 MG tablet, Take 1 tablet (40 mg total) by mouth daily., Disp: 30 tablet, Rfl: 11 .  potassium chloride SA (K-DUR,KLOR-CON) 20 MEQ tablet, Take 1 tablet (20 mEq total) by mouth daily., Disp: 30 tablet, Rfl: 3 .  prasugrel (EFFIENT) 10 MG TABS tablet, Take 1 tablet (10 mg total) by mouth daily., Disp: 90 tablet, Rfl: 1 .  PROVENTIL HFA 108 (90 Base) MCG/ACT inhaler, INHALE 2 PUFFS INTO THE LUNGS EVERY 6 HOURS AS NEEDED FOR SHORTNESS OF BREATH OR WHEEZING., Disp: 18 g, Rfl: 1 .  traZODone (DESYREL) 100 MG tablet, Take 100 mg by mouth at bedtime. , Disp: , Rfl:   Past Medical History: Past  Medical History:  Diagnosis Date  . Allergy   . Anxiety   . Arthritis   . Asthma    hip replacement  . Back pain   . Bulging of cervical intervertebral disc   . CAD (coronary artery disease)    lateral STEMI 02/06/2015 00% D1 occlusion treated with Promus Premier 2.5 mm x 16 mm DES, 70% ramus stenosis, 40% mid RCA stenosis, 45% distal RCA stenosis, EF 45-50%  . CHF (congestive heart failure) ([place])   . COPD (chronic obstructive pulmonary disease) ([place])   . Depression   . Diabetes mellitus without complication ([place])   . 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 Dry eye   . Dyspnea   . Early satiety  09/23/2016  . GERD (gastroesophageal reflux disease)   . Headache   . Heart murmur   . Hip pain   . Hyperlipidemia   . Hypertension   . MI (myocardial infarction) ([place])   . Myocardial infarction ([place])   . Neck pain   . Otitis media   . Pleurisy   . Sinus pause    9 sec sinus pause on telemetry after started on coreg after MI, avoid AV nodal blocking agent  . Sleep apnea   . 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 Unilateral primary osteoarthritis, right hip 07/01/2016    Tobacco Use: Social History   Tobacco Use  Smoking Status Current Every Day Smoker  . Packs/day: 1.00  . Years: 46.00  . Pack years: 46.00  . Types: Cigarettes  . Start date: 06/07/1979  Smokeless Tobacco Former Systems developer  . Types: Chew    Labs: Recent Review Flowsheet Data    Labs for ITP Cardiac and Pulmonary Rehab Latest Ref Rng & Units 05/20/2016 08/05/2016 11/11/2016 04/15/2017 04/27/2017   Cholestrol 0 - 200 mg/dL 128 132 112 134 93   LDLCALC 0 - 99 mg/dL 60 56 49 71 34   HDL >40 mg/dL 24(L) 22(L) 20(L) 24(L) 18(L)   Trlycerides <150 mg/dL 222(H) 272(H) 214(H) 193(H) 206(H)   Hemoglobin A1c <5.7 % 8.0(H) 6.3(H) 6.8(H) 7.6(H) -   PHART 7.350 - 7.450 - - - - -   PCO2ART 35.0 - 45.0 mmHg - - - - -   HCO3 20.0 - 24.0 mEq/L - - - - -   TCO2 0 - 100 mmol/L - - - - -   O2SAT % - - - - -      Capillary Blood Glucose: Lab Results  Component Value Date   GLUCAP 138 (H) 06/30/2017   GLUCAP 274 (H) 06/29/2017   GLUCAP 135 (H) 06/29/2017   GLUCAP 147 (H) 06/29/2017   GLUCAP 139 (H) 06/29/2017     Exercise Target Goals:    Exercise Program Goal: Individual exercise prescription set with THRR, safety & activity barriers. Participant demonstrates ability to understand and report RPE using BORG scale, to self-measure pulse accurately, and to acknowledge the importance of the exercise prescription.  Exercise Prescription Goal: Starting with aerobic activity 30 plus minutes a day, 3 days per week for initial  exercise prescription. Provide home exercise prescription and guidelines that participant acknowledges understanding prior to discharge.  Activity Barriers & Risk Stratification: Activity Barriers & Cardiac Risk Stratification - 06/22/17 1340      Activity Barriers & Cardiac Risk Stratification   Activity Barriers  Arthritis;Back Problems;Right Hip Replacement;Joint Problems;Deconditioning;Muscular Weakness;Shortness of Breath;Balance Concerns    Cardiac Risk Stratification  High  6 Minute Walk: 6 Minute Walk    Row Name 06/22/17 1340         6 Minute Walk   Phase  Initial     Distance  1000 feet     Distance % Change  0 %     Distance Feet Change  0 ft     Walk Time  6 minutes     # of Rest Breaks  0     MPH  1.89     METS  1.59     RPE  10     Perceived Dyspnea   13     VO2 Peak  11.3     Symptoms  No     Resting HR  77 bpm     Resting BP  124/88     Resting Oxygen Saturation   95 %     Exercise Oxygen Saturation  during 6 min walk  91 %     Max Ex. HR  103 bpm     Max Ex. BP  184/100     2 Minute Post BP  126/84        Oxygen Initial Assessment:   Oxygen Re-Evaluation: Oxygen Re-Evaluation    Row Name 08/19/17 1302             Program Oxygen Prescription   Program Oxygen Prescription  None         Home Oxygen   Home Oxygen Device  Home Concentrator       Sleep Oxygen Prescription  CPAP       Liters per minute  2       Home Exercise Oxygen Prescription  None       Home at Rest Exercise Oxygen Prescription  None       Compliance with Home Oxygen Use  Yes         Goals/Expected Outcomes   Short Term Goals  To learn and exhibit compliance with exercise, home and travel O2 prescription       Long  Term Goals  Exhibits compliance with exercise, home and travel O2 prescription       Comments  Patient uses O2 with CPAP at night at 2 L/Min. He is complaint with his prescription.       Goals/Expected Outcomes  Patient will continue to be complaint with  his O2/CPAP prescription.           Oxygen Discharge (Final Oxygen Re-Evaluation): Oxygen Re-Evaluation - 08/19/17 1302      Program Oxygen Prescription   Program Oxygen Prescription  None      Home Oxygen   Home Oxygen Device  Home Concentrator    Sleep Oxygen Prescription  CPAP    Liters per minute  2    Home Exercise Oxygen Prescription  None    Home at Rest Exercise Oxygen Prescription  None    Compliance with Home Oxygen Use  Yes      Goals/Expected Outcomes   Short Term Goals  To learn and exhibit compliance with exercise, home and travel O2 prescription    Long  Term Goals  Exhibits compliance with exercise, home and travel O2 prescription    Comments  Patient uses O2 with CPAP at night at 2 L/Min. He is complaint with his prescription.    Goals/Expected Outcomes  Patient will continue to be complaint with his O2/CPAP prescription.        Initial Exercise Prescription: Initial Exercise Prescription - 06/22/17 1300  Date of Initial Exercise RX and Referring Provider   Date  06/22/17    Referring Provider  Dr. [name]      NuStep   Level  2    SPM  50    Minutes  20    METs  1.7      Arm Ergometer   Level  1.5    Watts  12    RPM  12    Minutes  15    METs  1.9      Prescription Details   Frequency (times per week)  3    Duration  Progress to 30 minutes of continuous aerobic without signs/symptoms of physical distress      Intensity   THRR 40-80% of Max Heartrate  113-132-150    Ratings of Perceived Exertion  11-13    Perceived Dyspnea  0-4      Progression   Progression  Continue progressive overload as per policy without signs/symptoms or physical distress.      Resistance Training   Training Prescription  Yes    Weight  1    Reps  10-15       Perform Capillary Blood Glucose checks as needed.  Exercise Prescription Changes:  Exercise Prescription Changes    Row Name 07/08/17 0800 07/15/17 1400 07/28/17 0800 08/18/17 1000 09/02/17 0700      Response to Exercise   Blood Pressure (Admit)  -  142/74  132/76  148/80  142/80   Blood Pressure (Exercise)  -  150/78  156/84  150/80  168/70   Blood Pressure (Exit)  -  138/74  128/72  130/80  128/74   Heart Rate (Admit)  -  91 bpm  92 bpm  82 bpm  83 bpm   Heart Rate (Exercise)  -  107 bpm  112 bpm  108 bpm  106 bpm   Heart Rate (Exit)  -  98 bpm  102 bpm  92 bpm  90 bpm   Rating of Perceived Exertion (Exercise)  -  '13  10  10  10   '$ Duration  -  Progress to 30 minutes of  aerobic without signs/symptoms of physical distress  Progress to 30 minutes of  aerobic without signs/symptoms of physical distress  Progress to 30 minutes of  aerobic without signs/symptoms of physical distress  Progress to 30 minutes of  aerobic without signs/symptoms of physical distress   Intensity  -  THRR New 122-137-153  THRR New 122-138-153  THRR New 116-134-151  THRR New [phone]     Progression   Progression  -  Continue to progress workloads to maintain intensity without signs/symptoms of physical distress.  Continue to progress workloads to maintain intensity without signs/symptoms of physical distress.  Continue to progress workloads to maintain intensity without signs/symptoms of physical distress.  Continue to progress workloads to maintain intensity without signs/symptoms of physical distress.     Resistance Training   Training Prescription  Yes  Yes  Yes  Yes  Yes   Weight  '1  1  2  2  3   '$ Reps  10-15  10-15  10-15  10-15  10-15     Treadmill   MPH  -  1.2  1.4  1.5  1.5   Grade  -  0  0  0  0   Minutes  -  '15  15  15  15   '$ METs  -  1.9  2  2.1  2.1     NuStep   Level  '2  2  2  3  3   '$ SPM  50  105  133  117  112   Minutes  '20  20  20  20  20   '$ METs  1.7  3.4  5.5  3.5  3.7     Arm Ergometer   Level  1.5  -  -  -  -   Watts  12  -  -  -  -   RPM  12  -  -  -  -   Minutes  15  -  -  -  -   METs  1.9  -  -  -  -     Home Exercise Plan   Plans to continue exercise at  Home (comment)   Home (comment)  Home (comment)  Home (comment)  Home (comment)   Frequency  Add 2 additional days to program exercise sessions.  Add 2 additional days to program exercise sessions.  Add 2 additional days to program exercise sessions.  Add 2 additional days to program exercise sessions.  Add 2 additional days to program exercise sessions.   Initial Home Exercises Provided  07/08/17  07/08/17  07/08/17  07/08/17  07/08/17   Row Name 09/14/17 1100             Response to Exercise   Blood Pressure (Admit)  140/74       Blood Pressure (Exercise)  156/80       Blood Pressure (Exit)  124/68       Heart Rate (Admit)  89 bpm       Heart Rate (Exercise)  115 bpm       Heart Rate (Exit)  93 bpm       Rating of Perceived Exertion (Exercise)  10       Duration  Progress to 30 minutes of  aerobic without signs/symptoms of physical distress       Intensity  THRR New 121-136-152         Progression   Progression  Continue to progress workloads to maintain intensity without signs/symptoms of physical distress.         Resistance Training   Training Prescription  Yes       Weight  3       Reps  10-15         Treadmill   MPH  1.6       Grade  0       Minutes  15       METs  2.2         NuStep   Level  3       SPM  115       Minutes  20       METs  3.7         Home Exercise Plan   Plans to continue exercise at  Home (comment)       Frequency  Add 2 additional days to program exercise sessions.       Initial Home Exercises Provided  07/08/17          Exercise Comments:  Exercise Comments    Row Name 06/25/17 1529 07/08/17 0836 07/28/17 0816 08/18/17 1003 09/02/17 0748   Exercise Comments  Patient has not yet started CR and will be progressed in time.   Patient received the take home exercise plan today 07/08/2017.  THR was addressed as were safe ways to be active outside of Rehab. Patient demonstrated an understanding. Patient was encouraged to ask any future questions.   Patient is doing  very well in CR. He has increased his spee on the treadmill and has increased his watts and METs very high in the Nustep machine. He also has a lower blood pressure than before. He also has gone up in weight for teh warm up to two pounds.   Patient is doing very well in CR. He has increased his speed on the treadmill as well as his level on the Nustep. He does not use the handle bars on the Nustep due to shoulder pain. He states that this program has helped him gain endurance and stamina enough to walk and play with his dog. He has also decreased his usage of cigarrettes.  Patient is doing well in CR and has maintained his levels and SPMs on the machines. He states that he feels that he is getting stronger and more stamina.    [place] Name 09/14/17 1115           Exercise Comments  Patient is doing very well in CR he has increased his speed on the treadmill an dhas maintained his level on the Nustep. He states that he continues to walk his dog regularly and has more energy for that as well.          Exercise Goals and Review:  Exercise Goals    Row Name 06/22/17 1342             Exercise Goals   Increase Physical Activity  Yes       Intervention  Provide advice, education, support and counseling about physical activity/exercise needs.;Develop an individualized exercise prescription for aerobic and resistive training based on initial evaluation findings, risk stratification, comorbidities and participant's personal goals.       Expected Outcomes  Achievement of increased cardiorespiratory fitness and enhanced flexibility, muscular endurance and strength shown through measurements of functional capacity and personal statement of participant.       Increase Strength and Stamina  Yes       Intervention  Provide advice, education, support and counseling about physical activity/exercise needs.;Develop an individualized exercise prescription for aerobic and resistive training based on initial evaluation  findings, risk stratification, comorbidities and participant's personal goals.       Expected Outcomes  Achievement of increased cardiorespiratory fitness and enhanced flexibility, muscular endurance and strength shown through measurements of functional capacity and personal statement of participant.       Able to understand and use rate of perceived exertion (RPE) scale  Yes       Intervention  Provide education and explanation on how to use RPE scale       Expected Outcomes  Short Term: Able to use RPE daily in rehab to express subjective intensity level;Long Term:  Able to use RPE to guide intensity level when exercising independently       Able to understand and use Dyspnea scale  Yes       Intervention  Provide education and explanation on how to use Dyspnea scale       Expected Outcomes  Short Term: Able to use Dyspnea scale daily in rehab to express subjective sense of shortness of breath during exertion;Long Term: Able to use Dyspnea scale to guide intensity level when exercising independently       Knowledge and understanding of Target Heart Rate Range (THRR)  Yes       Intervention  Provide education and explanation of THRR including how the numbers were predicted and where they are located for reference       Expected Outcomes  Short Term: Able to state/look up THRR       Able to check pulse independently  Yes       Intervention  Provide education and demonstration on how to check pulse in carotid and radial arteries.;Review the importance of being able to check your own pulse for safety during independent exercise       Expected Outcomes  Short Term: Able to explain why pulse checking is important during independent exercise;Long Term: Able to check pulse independently and accurately       Understanding of Exercise Prescription  Yes       Intervention  Provide education, explanation, and written materials on patient's individual exercise prescription       Expected Outcomes  Short Term:  Able to explain program exercise prescription;Long Term: Able to explain home exercise prescription to exercise independently          Exercise Goals Re-Evaluation : Exercise Goals Re-Evaluation    Row Name 07/28/17 0814 08/18/17 1001 09/14/17 1113         Exercise Goal Re-Evaluation   Exercise Goals Review  Increase Physical Activity;Increase Strength and Stamina;Knowledge and understanding of Target Heart Rate Range (THRR)  Increase Physical Activity;Increase Strength and Stamina;Knowledge and understanding of Target Heart Rate Range (THRR)  Increase Physical Activity;Increase Strength and Stamina;Knowledge and understanding of Target Heart Rate Range (THRR)     Comments  Patient is doing very well in CR. He has increased his spee on the treadmill and has increased his watts and METs very high in the Nustep machine. He also has a lower blood pressure than before. He also has gone up in weight for teh warm up to two pounds.   Patient is doing very well in CR. He has increased his speed on the treadmill as well as his level on the Nustep. He does not use the handle bars on the Nustep due to shoulder pain. He states that this program has helped him gain endurance and stamina enough to walk and play with his dog. He has also decreased his usage of cigarrettes.   Patient is doing very well in CR he has increased his speed on the treadmill an dhas maintained his level on the Nustep. He states that he continues to walk his dog regularly and has more energy for that as well.      Expected Outcomes  Patient wishes to breathe better and to lose weight.   Patient wishes to breathe better and to lose weight over time.   Patient wishes to be able to breathe better and to lose weight.          Discharge Exercise Prescription (Final Exercise Prescription Changes): Exercise Prescription Changes - 09/14/17 1100      Response to Exercise   Blood Pressure (Admit)  140/74    Blood Pressure (Exercise)  156/80     Blood Pressure (Exit)  124/68    Heart Rate (Admit)  89 bpm    Heart Rate (Exercise)  115 bpm    Heart Rate (Exit)  93 bpm    Rating of Perceived Exertion (Exercise)  10    Duration  Progress to 30 minutes of  aerobic without signs/symptoms of physical distress    Intensity  THRR New [phone]  Progression   Progression  Continue to progress workloads to maintain intensity without signs/symptoms of physical distress.      Resistance Training   Training Prescription  Yes    Weight  3    Reps  10-15      Treadmill   MPH  1.6    Grade  0    Minutes  15    METs  2.2      NuStep   Level  3    SPM  115    Minutes  20    METs  3.7      Home Exercise Plan   Plans to continue exercise at  Home (comment)    Frequency  Add 2 additional days to program exercise sessions.    Initial Home Exercises Provided  07/08/17       Nutrition:  Target Goals: Understanding of nutrition guidelines, daily intake of sodium '1500mg'$ , cholesterol '200mg'$ , calories 30% from fat and 7% or less from saturated fats, daily to have 5 or more servings of fruits and vegetables.  Biometrics: Pre Biometrics - 06/22/17 1343      Pre Biometrics   Height  '5\' 6"'$  (1.676 m)    Weight  230 lb 9.6 oz (104.6 kg)    Waist Circumference  50 inches    Hip Circumference  43 inches    Waist to Hip Ratio  1.16 %    BMI (Calculated)  37.24    Triceps Skinfold  6 mm    % Body Fat  32 %    Grip Strength  62.33 kg    Flexibility  0 in    Single Leg Stand  15 seconds        Nutrition Therapy Plan and Nutrition Goals: Nutrition Therapy & Goals - 08/06/17 1518      Personal Nutrition Goals   Nutrition Goal  For heart healthy choices add >50% of whole grains, make half their plate fruits and vegetables. Discuss the difference between starchy vegetables and leafy greens, and how leafy vegetables provide fiber, helps maintain healthy weight, helps control blood glucose, and lowers cholesterol.  Discuss purchasing  fresh or frozen vegetable to reduce sodium and not to add grease, fat or sugar. Consume <18oz of red meat per week. Consume lean cuts of meats and very little of meats high in sodium and nitrates such as pork and lunch meats. Discussed portion control for all food groups.      Personal Goal #2  Continues to eat heart healthy low NA diabetic diet.     Additional Goals?  No    Comments  Patient attended nutrition class.       Intervention Plan   Intervention  Prescribe, educate and counsel regarding individualized specific dietary modifications aiming towards targeted core components such as weight, hypertension, lipid management, diabetes, heart failure and other comorbidities.;Nutrition handout(s) given to patient.    Expected Outcomes  Short Term Goal: Understand basic principles of dietary content, such as calories, fat, sodium, cholesterol and nutrients.       Nutrition Discharge: Rate Your Plate Scores: Nutrition Assessments - 06/22/17 1526      MEDFICTS Scores   Pre Score  64       Nutrition Goals Re-Evaluation: Nutrition Goals Re-Evaluation    [place] Name 08/19/17 1305 09/14/17 1328           Goals   Current Weight  227 lb (103 kg)  222 lb 1.6 oz (100.7 kg)  Nutrition Goal  For heart healthy choices add >50% of whole grains, make half their plate fruits and vegetables. Discuss the difference between starchy vegetables and leafy greens, and how leafy vegetables provide fiber, helps maintain healthy weight, helps control blood glucose, and lowers cholesterol.  Discuss purchasing fresh or frozen vegetable to reduce sodium and not to add grease, fat or sugar. Consume <18oz of red meat per week. Consume lean cuts of meats and very little of meats high in sodium and nitrates such as pork and lunch meats. Discussed portion control for all food groups.  For heart healthy choices add >50% of whole grains, make half their plate fruits and vegetables. Discuss the difference between starchy  vegetables and leafy greens, and how leafy vegetables provide fiber, helps maintain healthy weight, helps control blood glucose, and lowers cholesterol.  Discuss purchasing fresh or frozen vegetable to reduce sodium and not to add grease, fat or sugar. Consume <18oz of red meat per week. Consume lean cuts of meats and very little of meats high in sodium and nitrates such as pork and lunch meats. Discussed portion control for all food groups.      Comment  Patient has lost 3 lbs and is continuing to follow a heart healthy, low Na and diabetic diet. He says he is meeting with a dietician tomorrow to start on a reduced calorie diet. Will continue to monitor.   Patient has lost 8 lbs and is continuing to follow a heart healthy, low Na and diabetic diet. He met with dietician and is trying to follow a reduced calorie diet Will continue to monitor.       Expected Outcome  Patient will continue to meet his nutrtion goals at discharge.   Patient will continue to meet his nutrtion goals at discharge.         Personal Goal #2 Re-Evaluation   Personal Goal #2  Eat heart healthy/ low na and diabetic diet.   Eat heart healthy/ low na and diabetic, reduced calorie diet.          Nutrition Goals Discharge (Final Nutrition Goals Re-Evaluation): Nutrition Goals Re-Evaluation - 09/14/17 1328      Goals   Current Weight  222 lb 1.6 oz (100.7 kg)    Nutrition Goal  For heart healthy choices add >50% of whole grains, make half their plate fruits and vegetables. Discuss the difference between starchy vegetables and leafy greens, and how leafy vegetables provide fiber, helps maintain healthy weight, helps control blood glucose, and lowers cholesterol.  Discuss purchasing fresh or frozen vegetable to reduce sodium and not to add grease, fat or sugar. Consume <18oz of red meat per week. Consume lean cuts of meats and very little of meats high in sodium and nitrates such as pork and lunch meats. Discussed portion control for  all food groups.    Comment  Patient has lost 8 lbs and is continuing to follow a heart healthy, low Na and diabetic diet. He met with dietician and is trying to follow a reduced calorie diet Will continue to monitor.     Expected Outcome  Patient will continue to meet his nutrtion goals at discharge.       Personal Goal #2 Re-Evaluation   Personal Goal #2  Eat heart healthy/ low na and diabetic, reduced calorie diet.        Psychosocial: Target Goals: Acknowledge presence or absence of significant depression and/or stress, maximize coping skills, provide positive support system. Participant is able to  verbalize types and ability to use techniques and skills needed for reducing stress and depression.  Initial Review & Psychosocial Screening: Initial Psych Review & Screening - 06/22/17 1530      Initial Review   Current issues with  Current Depression;Current Anxiety/Panic      Family Dynamics   Good Support System?  Yes      Barriers   Psychosocial barriers to participate in program  There are no identifiable barriers or psychosocial needs.;The patient should benefit from training in stress management and relaxation.      Screening Interventions   Interventions  Encouraged to exercise;Provide feedback about the scores to participant       Quality of Life Scores: Quality of Life - 06/22/17 1344      Quality of Life Scores   Health/Function Pre  9 %    Socioeconomic Pre  13 %    Psych/Spiritual Pre  17 %    Family Pre  16.8 %    GLOBAL Pre  12.64 %       PHQ-9: Recent Review Flowsheet Data    Depression screen [facility] 2/9 08/20/2017 08/17/2017 06/22/2017 06/17/2017 04/17/2017   Decreased Interest 2 0 2 0 0   Down, Depressed, Hopeless 2 0 2 0 0   PHQ - 2 Score 4 0 4 0 0   Altered sleeping 2 - 2 - -   Tired, decreased energy 1 - 2 - -   Change in appetite 2 - 3 - -   Feeling bad or failure about yourself  0 - 3 - -   Trouble concentrating 0 - 1 - -   Moving slowly or  fidgety/restless 0 - 0 - -   Suicidal thoughts 0 - 0 - -   PHQ-9 Score 9 - 15 - -   Difficult doing work/chores Very difficult - Extremely dIfficult - -     Interpretation of Total Score  Total Score Depression Severity:  1-4 = Minimal depression, 5-9 = Mild depression, 10-14 = Moderate depression, 15-19 = Moderately severe depression, 20-27 = Severe depression   Psychosocial Evaluation and Intervention: Psychosocial Evaluation - 06/22/17 1531      Psychosocial Evaluation & Interventions   Interventions  Relaxation education;Encouraged to exercise with the program and follow exercise prescription    Continue Psychosocial Services   Follow up required by staff       Psychosocial Re-Evaluation: Psychosocial Re-Evaluation    Row Name 07/29/17 1455 08/19/17 1317 09/14/17 1337         Psychosocial Re-Evaluation   Current issues with  Current Depression;Current Anxiety/Panic  Current Anxiety/Panic;Current Depression  Current Anxiety/Panic;Current Depression     Comments  Patient's initial QOL was 12.64 and his PHQ-9 was 15. He is seeing a Social worker and is being treated with trazadone and alprazolam.   Patient depression and anxiety continue to be managed with Trazadone and as needed Alprazolam.   Patient depression and anxiety continue to be managed with Trazadone and as needed Alprazolam.      Expected Outcomes  Patient's depression and anxiety will be managed at discharge with no additional psychosocial barriers identified.   Patient's depression and anxiety will continue to be managed at discharge with no other psychosocial issues identified.   Patient's depression and anxiety will continue to be managed at discharge with no other psychosocial issues identified.      Interventions  Stress management education;Encouraged to attend Cardiac Rehabilitation for the exercise  Stress management education;Encouraged to attend Cardiac  Rehabilitation for the exercise;Relaxation education  Stress  management education;Encouraged to attend Cardiac Rehabilitation for the exercise;Relaxation education     Continue Psychosocial Services   Follow up required by staff  Follow up required by staff  Follow up required by staff        Psychosocial Discharge (Final Psychosocial Re-Evaluation): Psychosocial Re-Evaluation - 09/14/17 1337      Psychosocial Re-Evaluation   Current issues with  Current Anxiety/Panic;Current Depression    Comments  Patient depression and anxiety continue to be managed with Trazadone and as needed Alprazolam.     Expected Outcomes  Patient's depression and anxiety will continue to be managed at discharge with no other psychosocial issues identified.     Interventions  Stress management education;Encouraged to attend Cardiac Rehabilitation for the exercise;Relaxation education    Continue Psychosocial Services   Follow up required by staff       Vocational Rehabilitation: Provide vocational rehab assistance to qualifying candidates.   Vocational Rehab Evaluation & Intervention: Vocational Rehab - 06/22/17 1524      Initial Vocational Rehab Evaluation & Intervention   Assessment shows need for Vocational Rehabilitation  No       Education: Education Goals: Education classes will be provided on a weekly basis, covering required topics. Participant will state understanding/return demonstration of topics presented.  Learning Barriers/Preferences: Learning Barriers/Preferences - 06/22/17 1520      Learning Barriers/Preferences   Learning Barriers  None    Learning Preferences  Skilled Demonstration;Individual Instruction;Group Instruction;Video;Written Material;Pictoral       Education Topics: Hypertension, Hypertension Reduction -Define heart disease and high blood pressure. Discus how high blood pressure affects the body and ways to reduce high blood pressure.   CARDIAC REHAB PHASE II EXERCISE from 09/09/2017 in [place]  Date   07/08/17  Educator  [place]  Instruction Review Code  2- Demonstrated Understanding      Exercise and Your Heart -Discuss why it is important to exercise, the FITT principles of exercise, normal and abnormal responses to exercise, and how to exercise safely.   CARDIAC REHAB PHASE II EXERCISE from 09/09/2017 in [place]  Date  07/15/17  Educator  DC  Instruction Review Code  2- Demonstrated Understanding      Angina -Discuss definition of angina, causes of angina, treatment of angina, and how to decrease risk of having angina.   CARDIAC REHAB PHASE II EXERCISE from 09/09/2017 in [place]  Date  07/22/17  Educator  D. Coad  Instruction Review Code  2- Demonstrated Understanding      Cardiac Medications -Review what the following cardiac medications are used for, how they affect the body, and side effects that may occur when taking the medications.  Medications include Aspirin, Beta blockers, calcium channel blockers, ACE Inhibitors, angiotensin receptor blockers, diuretics, digoxin, and antihyperlipidemics.   CARDIAC REHAB PHASE II EXERCISE from 09/09/2017 in [place]  Date  07/29/17  Educator  DJ  Instruction Review Code  2- Demonstrated Understanding      Congestive Heart Failure -Discuss the definition of CHF, how to live with CHF, the signs and symptoms of CHF, and how keep track of weight and sodium intake.   CARDIAC REHAB PHASE II EXERCISE from 09/09/2017 in [place]  Date  08/05/17  Educator  DC  Instruction Review Code  2- Demonstrated Understanding      Heart Disease and Intimacy -Discus the effect sexual activity has on the heart,  how changes occur during intimacy as we age, and safety during sexual activity.   Smoking Cessation / COPD -Discuss different methods to quit smoking, the health benefits of quitting smoking, and the definition of COPD.   CARDIAC REHAB PHASE II  EXERCISE from 09/09/2017 in [place]  Date  08/19/17  Educator  DC  Instruction Review Code  2- Demonstrated Understanding      Nutrition I: Fats -Discuss the types of cholesterol, what cholesterol does to the heart, and how cholesterol levels can be controlled.   Nutrition II: Labels -Discuss the different components of food labels and how to read food label   CARDIAC REHAB PHASE II EXERCISE from 09/09/2017 in [place]  Date  09/02/17  Educator  [place]  Instruction Review Code  2- Demonstrated Understanding      Heart Parts and Heart Disease -Discuss the anatomy of the heart, the pathway of blood circulation through the heart, and these are affected by heart disease.   CARDIAC REHAB PHASE II EXERCISE from 09/09/2017 in [place]  Date  09/09/17  Educator  DJ  Instruction Review Code  2- Demonstrated Understanding      Stress I: Signs and Symptoms -Discuss the causes of stress, how stress may lead to anxiety and depression, and ways to limit stress.   Stress II: Relaxation -Discuss different types of relaxation techniques to limit stress.   Warning Signs of Stroke / TIA -Discuss definition of a stroke, what the signs and symptoms are of a stroke, and how to identify when someone is having stroke.   Knowledge Questionnaire Score: Knowledge Questionnaire Score - 06/22/17 1523      Knowledge Questionnaire Score   Pre Score  23/24       Core Components/Risk Factors/Patient Goals at Admission: Personal Goals and Risk Factors at Admission - 06/22/17 1527      Core Components/Risk Factors/Patient Goals on Admission    Weight Management  Yes    Admit Weight  202 lb (91.6 kg)    Goal Weight: Short Term  177 lb (80.3 kg)    Goal Weight: Long Term  152 lb (68.9 kg)    Expected Outcomes  Short Term: Continue to assess and modify interventions until short term weight is achieved;Long Term: Adherence to  nutrition and physical activity/exercise program aimed toward attainment of established weight goal    Tobacco Cessation  Yes    Intervention  Assist the participant in steps to quit. Provide individualized education and counseling about committing to Tobacco Cessation, relapse prevention, and pharmacological support that can be provided by physician.    Expected Outcomes  Short Term: Will quit all tobacco product use, adhering to prevention of relapse plan.;Long Term: Complete abstinence from all tobacco products for at least 12 months from quit date.    Improve shortness of breath with ADL's  Yes    Intervention  Provide education, individualized exercise plan and daily activity instruction to help decrease symptoms of SOB with activities of daily living.    Expected Outcomes  Short Term: Achieves a reduction of symptoms when performing activities of daily living.    Stress  Yes    Intervention  Offer individual and/or small group education and counseling on adjustment to heart disease, stress management and health-related lifestyle change. Teach and support self-help strategies.    Expected Outcomes  Short Term: Participant demonstrates changes in health-related behavior, relaxation and other stress management skills, ability to obtain effective social  support, and compliance with psychotropic medications if prescribed.    Personal Goal Other  Yes    Personal Goal  Breathe better, Lose 50 lbs    Intervention  Attend program 3 x week and supplement 2 x week exercise at home    Expected Outcomes  Meet personal goals       Core Components/Risk Factors/Patient Goals Review:  Goals and Risk Factor Review    Row Name 07/29/17 1449 08/19/17 1309 09/14/17 1330         Core Components/Risk Factors/Patient Goals Review   Personal Goals Review  Weight Management/Obesity;Diabetes;Tobacco Cessation Breathe better; lose 50 lbs long term.   Weight Management/Obesity;Improve shortness of breath with  ADL's;Diabetes;Tobacco Cessation Breathe better; lose 50 lbs long term.   Weight Management/Obesity;Improve shortness of breath with ADL's;Diabetes;Tobacco Cessation     Review  Patient has completed 11 sessions gaining 1 lb. He is doing well in the program with progression. He continues to smoke but says he is trying to cut back. He is trying to alter his diet by cutting out soda's and cutting down on candy and sweets. His last A1C was 7.6 on 04/15/17. He says he is breathing better noting less SOB with activity. He says he feels stronger and is walking more at home. He has a stationary bike and hopes to work up to riding it. Will continue to monitor.   Patient has completed 17 sessions losing 4 lbs. Patient has reduced his smoking to 1/2 pk/day. He is not interested in tobacco cessation program but says he can quit on his own. His last A1C was 03/10/16 8.2. He states he has more energy and feels stronger. He is able to do more around the house now and is walking more and further. Will continue to monitor.   Patient has completed 24 sessions losing 8 lbs. Patient has stopped buying cigerattes and is using a nicotine patch. He says he has borrowed cigerattes from family and friends occasionally. His last A1C was 08/22/17 7.6 which was down from his last in July 2017 of 8.2. He continues to have more energy and feels stronger with less SOB. He is able to walk more and further. He is able to walk 2 blocks now without difficulty.  Will continue to monitor.      Expected Outcomes  Patient will continue to attend sessions and complete the program and continue to meet his personal goals.   Patient will complete the program and continue to meet his personal goals.   Patient will complete the program and continue to meet his personal goals.         Core Components/Risk Factors/Patient Goals at Discharge (Final Review):  Goals and Risk Factor Review - 09/14/17 1330      Core Components/Risk Factors/Patient Goals  Review   Personal Goals Review  Weight Management/Obesity;Improve shortness of breath with ADL's;Diabetes;Tobacco Cessation    Review  Patient has completed 24 sessions losing 8 lbs. Patient has stopped buying cigerattes and is using a nicotine patch. He says he has borrowed cigerattes from family and friends occasionally. His last A1C was 08/22/17 7.6 which was down from his last in July 2017 of 8.2. He continues to have more energy and feels stronger with less SOB. He is able to walk more and further. He is able to walk 2 blocks now without difficulty.  Will continue to monitor.     Expected Outcomes  Patient will complete the program and continue to meet his personal goals.  ITP Comments: ITP Comments    Row Name 06/29/17 0755 08/06/17 1519         ITP Comments  Patient new to program. He is currently being evaluated for a syncope episode. Plans to start 07/06/17. Will continue to monitor.   Pattient attended family matters with hospital chaplain to discuss how his recent diagnosis has changed his life.          Comments: ITP 30 Day REVIEW Patient doing well in the program. Will continue to monitor for progress.

## 2017-09-14 NOTE — Progress Notes (Signed)
Daily Session Note  Patient Details  Name: ANTAVION BARTOSZEK MRN: [id number] Date of Birth: 08/06/1966 Referring Provider:     CARDIAC REHAB PHASE II ORIENTATION from 06/22/2017 in [place]  Referring Provider  Dr. [name]      Encounter Date: 09/14/2017  Check In: Session Check In - 09/14/17 0830      Check-In   Location  [facility]    Staff Present  Diane Angelina Pih, MS, EP, [facility], Exercise Physiologist;Debra Wynetta Emery, RN, BSN;Alaylah Heatherington, BS, EP, Exercise Physiologist    Supervising physician immediately available to respond to emergencies  See telemetry face sheet for immediately available MD    Medication changes reported      No    Fall or balance concerns reported     No    Warm-up and Cool-down  Performed as group-led instruction    Resistance Training Performed  Yes    VAD Patient?  No      Pain Assessment   Currently in Pain?  No/denies    Pain Score  0-No pain    Multiple Pain Sites  No       Capillary Blood Glucose: No results found for this or any previous visit (from the past 24 hour(s)).    Social History   Tobacco Use  Smoking Status Current Every Day Smoker  . Packs/day: 1.00  . Years: 46.00  . Pack years: 46.00  . Types: Cigarettes  . Start date: 06/07/1979  Smokeless Tobacco Former Systems developer  . Types: Chew    Goals Met:  Independence with exercise equipment Exercise tolerated well No report of cardiac concerns or symptoms Strength training completed today  Goals Unmet:  Not Applicable  Comments: Check out 915   Dr. Kate Sable is Medical Director for [place] and Pulmonary Rehab.

## 2017-09-16 ENCOUNTER — Encounter (HOSPITAL_COMMUNITY)
Admission: RE | Admit: 2017-09-16 | Discharge: 2017-09-16 | Disposition: A | Discharge status: Home / Self Care | Payer: Medicaid Other | Source: Ambulatory Visit | Attending: Cardiology | Admitting: Cardiology

## 2017-09-16 DIAGNOSIS — I214 Non-ST elevation (NSTEMI) myocardial infarction: Secondary | ICD-10-CM | POA: Diagnosis not present

## 2017-09-16 DIAGNOSIS — Z955 Presence of coronary angioplasty implant and graft: Secondary | ICD-10-CM

## 2017-09-16 NOTE — Progress Notes (Signed)
Daily Session Note  Patient Details  Name: Randy Hebert MRN: [id number] Date of Birth: 27-Nov-1965 Referring Provider:     CARDIAC REHAB PHASE II ORIENTATION from 06/22/2017 in [place]  Referring Provider  Dr. [name]      Encounter Date: 09/16/2017  Check In: Session Check In - 09/16/17 0811      Check-In   Location  [facility]    Staff Present  Russella Dar, MS, EP, [facility], Exercise Physiologist;Montford Barg Luther Parody, BS, EP, Exercise Physiologist;Debra Wynetta Emery, RN, BSN    Supervising physician immediately available to respond to emergencies  See telemetry face sheet for immediately available MD    Medication changes reported      No    Fall or balance concerns reported     No    Warm-up and Cool-down  Performed as group-led instruction    Resistance Training Performed  Yes    VAD Patient?  No      Pain Assessment   Currently in Pain?  No/denies    Pain Score  0-No pain    Multiple Pain Sites  No       Capillary Blood Glucose: No results found for this or any previous visit (from the past 24 hour(s)).    Social History   Tobacco Use  Smoking Status Current Every Day Smoker  . Packs/day: 1.00  . Years: 46.00  . Pack years: 46.00  . Types: Cigarettes  . Start date: 06/07/1979  Smokeless Tobacco Former Systems developer  . Types: Chew    Goals Met:  Independence with exercise equipment Exercise tolerated well No report of cardiac concerns or symptoms Strength training completed today  Goals Unmet:  Not Applicable  Comments: Check out 915   Dr. Kate Sable is Medical Director for [place] and Pulmonary Rehab.

## 2017-09-17 ENCOUNTER — Ambulatory Visit: Payer: Medicaid Other | Admitting: Family Medicine

## 2017-09-18 ENCOUNTER — Other Ambulatory Visit: Payer: Self-pay

## 2017-09-18 ENCOUNTER — Encounter (HOSPITAL_COMMUNITY)
Admission: RE | Admit: 2017-09-18 | Discharge: 2017-09-18 | Disposition: A | Discharge status: Home / Self Care | Payer: Medicaid Other | Source: Ambulatory Visit | Attending: Cardiology | Admitting: Cardiology

## 2017-09-18 ENCOUNTER — Ambulatory Visit: Payer: Medicaid Other | Admitting: Family Medicine

## 2017-09-18 ENCOUNTER — Encounter: Payer: Self-pay | Admitting: Family Medicine

## 2017-09-18 VITALS — BP 122/82 | HR 80 | Temp 98.9°F | Resp 24 | Ht 66.0 in | Wt 225.0 lb

## 2017-09-18 DIAGNOSIS — I25110 Atherosclerotic heart disease of native coronary artery with unstable angina pectoris: Secondary | ICD-10-CM

## 2017-09-18 DIAGNOSIS — M25552 Pain in left hip: Secondary | ICD-10-CM | POA: Diagnosis not present

## 2017-09-18 DIAGNOSIS — I10 Essential (primary) hypertension: Secondary | ICD-10-CM

## 2017-09-18 DIAGNOSIS — I214 Non-ST elevation (NSTEMI) myocardial infarction: Secondary | ICD-10-CM | POA: Diagnosis not present

## 2017-09-18 DIAGNOSIS — J3089 Other allergic rhinitis: Secondary | ICD-10-CM

## 2017-09-18 DIAGNOSIS — F17200 Nicotine dependence, unspecified, uncomplicated: Secondary | ICD-10-CM

## 2017-09-18 DIAGNOSIS — Z955 Presence of coronary angioplasty implant and graft: Secondary | ICD-10-CM

## 2017-09-18 MED ORDER — FLUTICASONE PROPIONATE 50 MCG/ACT NA SUSP: 2.0000 | Freq: Every day | NASAL | 6 refills | Status: DC

## 2017-09-18 MED ORDER — PREDNISONE 10 MG PO TABS: 10.0000 mg | ORAL_TABLET | Freq: Every day | ORAL | 0 refills | Status: DC

## 2017-09-18 NOTE — Progress Notes (Signed)
Daily Session Note  Patient Details  Name: Randy Hebert MRN: [id number] Date of Birth: [birth year] Referring Provider:     CARDIAC REHAB PHASE II ORIENTATION from 06/22/2017 in [place]  Referring Provider  Dr. [name]      Encounter Date: 09/18/2017  Check In: Session Check In - 09/18/17 0810      Check-In   Location  [facility]    Staff Present  Diane Angelina Pih, MS, EP, [facility], Exercise Physiologist;Charyl Minervini Luther Parody, BS, EP, Exercise Physiologist;Debra Wynetta Emery, RN, BSN    Supervising physician immediately available to respond to emergencies  See telemetry face sheet for immediately available MD    Medication changes reported      No    Fall or balance concerns reported     No    Warm-up and Cool-down  Performed as group-led instruction    Resistance Training Performed  Yes    VAD Patient?  No      Pain Assessment   Currently in Pain?  Yes    Pain Score  8     Pain Location  Shoulder    Pain Orientation  Left    Pain Descriptors / Indicators  Aching;Constant    Pain Type  Acute pain    Pain Onset  Today    Pain Frequency  Occasional    Multiple Pain Sites  No       Capillary Blood Glucose: No results found for this or any previous visit (from the past 24 hour(s)).    Social History   Tobacco Use  Smoking Status Current Every Day Smoker  . Packs/day: 1.00  . Years: 46.00  . Pack years: 46.00  . Types: Cigarettes  . Start date: 06/07/1979  Smokeless Tobacco Former Systems developer  . Types: Chew    Goals Met:  Independence with exercise equipment Exercise tolerated well No report of cardiac concerns or symptoms Strength training completed today  Goals Unmet:  Not Applicable  Comments: Check out 915   Dr. Kate Sable is Medical Director for [place] and Pulmonary Rehab.

## 2017-09-18 NOTE — Patient Instructions (Signed)
Take the prednisone once a day for 7 days Use ice to hip twice a day Use the flonase daily This is for sinus congestion Continue to walk every day Need diabetes blood work next month  See me in 3 months

## 2017-09-18 NOTE — Progress Notes (Signed)
Chief Complaint  Patient presents with  . Follow-up    3 month   Patient is here for routine follow-up. He is under the care of cardiology for his known coronary artery disease.  He is going to cardiac rehab on a regular basis.  He is cut way back on his smoking and smokes about 2 cigarettes a day.  It is again emphasized to him the importance that he quit smoking completely. He is compliant with his medications.  He has not needed any nitro. He complains of a new complaint, left hip pain.  This is been bothering him since he is exercising and he is requesting an x-ray.  I review an x-ray that was done in October 2018, and his hip joint looks fine.  The hip pain is lateral.  On exam he has trochanteric bursitis.  Treatment of this is discussed. His other new complaint is of sinus congestion or runny nose.  He notices this more since he is using a CPAP.  He states he is using it properly and cleaning it properly.  Going to give him a trial of Flonase to see if this helps the symptom. His blood pressure is well controlled today. His last LDL, done 5 months ago, was 34  Patient Active Problem List   Diagnosis Date Noted  . DM type 2 causing vascular disease ([place]) 08/17/2017  . Noncompliance 07/10/2017  . Syncope 06/29/2017  . HCAP (healthcare-associated pneumonia) 05/15/2017  . Uncontrolled type 2 diabetes mellitus with hyperglycemia, without long-term current use of insulin ([place]) 05/15/2017  . Acute on chronic respiratory failure with hypoxia ([place]) 05/15/2017  . Lobar pneumonia ([place]) 05/15/2017  . Chronic combined systolic and diastolic CHF (congestive heart failure) ([place]) 05/15/2017  . Sinus pause 05/15/2017  . Syncope and collapse 05/15/2017  . Bradycardia 04/28/2017  . Sleep apnea 04/28/2017  . Non-ST elevation (NSTEMI) myocardial infarction ([place]) 04/27/2017  . NSTEMI (non-ST elevated myocardial infarction) ([place]) 04/26/2017  . Substance abuse ([place]) 04/15/2017  . Depression with  anxiety 04/15/2017  . GERD (gastroesophageal reflux disease) 09/23/2016  . 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 Chronic obstructive pulmonary disease ([place]) 08/27/2015  . Cigarette nicotine dependence with nicotine-induced disorder 08/27/2015  . Hyperlipidemia 08/27/2015  . Class 2 severe obesity due to excess calories with serious comorbidity and body mass index (BMI) of 36.0 to 36.9 in adult [facility]) 08/27/2015  . Acute on chronic systolic heart failure ([place]) 06/02/2015  . ACE inhibitor-aggravated angioedema   . Chronic systolic CHF (congestive heart failure) ([place]) 06/01/2015  . CAD (coronary artery disease) 05/31/2015  . STEMI (ST elevation myocardial infarction) ([place]) 05/31/2015  . Tobacco use disorder 05/31/2015  . Essential hypertension 02/09/2015    Outpatient Encounter Medications as of 09/18/2017  Medication Sig  . acetaminophen (TYLENOL) 325 MG tablet Take 650 mg by mouth every 6 (six) hours as needed for mild pain.  Marland Kitchen albuterol (PROVENTIL) (2.5 MG/3ML) 0.083% nebulizer solution Take 3 mLs (2.5 mg total) by nebulization every 6 (six) hours as needed for wheezing or shortness of breath.  Marland Kitchen aspirin 81 MG EC tablet Take 1 tablet (81 mg total) by mouth daily.  Marland Kitchen atorvastatin (LIPITOR) 80 MG tablet Take 80 mg by mouth daily.  . Carboxymeth-Glycerin-Polysorb (REFRESH OPTIVE ADVANCED) 0.5-1-0.5 % SOLN Apply 1 drop to eye 2 (two) times daily as needed (dry eyes).   . citalopram (CELEXA) 20 MG tablet Take 20 mg daily by mouth.  . cycloSPORINE (RESTASIS) 0.05 %  ophthalmic emulsion Place 1 drop into both eyes 2 (two) times daily.  . furosemide (LASIX) 40 MG tablet Take 1 tablet (40 mg total) by mouth daily.  Marland Kitchen glucose blood (IGLUCOSE TEST STRIPS) test strip 1 each by Other route 2 (two) times daily. Use as instructed  . Magnesium 200 MG TABS Take 1 tablet (200 mg total) by mouth daily.  . metFORMIN (GLUCOPHAGE) 1000 MG tablet Take 1 tablet (1,000 mg total) by mouth 2 (two)  times daily with a meal.  . mometasone-formoterol (DULERA) 100-5 MCG/ACT AERO Inhale 2 puffs into the lungs 2 (two) times daily.  . nicotine (NICODERM CQ - DOSED IN MG/24 HOURS) 21 mg/24hr patch Place 1 patch (21 mg total) onto the skin daily. (Patient taking differently: Place 21 mg onto the skin daily. )  . nitroGLYCERIN (NITROSTAT) 0.4 MG SL tablet Place 1 tablet (0.4 mg total) under the tongue every 5 (five) minutes as needed for chest pain.  . Omega-3 Fatty Acids (FISH OIL) 1200 MG CAPS Take 1,200 mg by mouth 2 (two) times daily.  . pantoprazole (PROTONIX) 40 MG tablet Take 1 tablet (40 mg total) by mouth daily.  . potassium chloride SA (K-DUR,KLOR-CON) 20 MEQ tablet Take 1 tablet (20 mEq total) by mouth daily.  . prasugrel (EFFIENT) 10 MG TABS tablet Take 1 tablet (10 mg total) by mouth daily.  Marland Kitchen PROVENTIL HFA 108 (90 Base) MCG/ACT inhaler INHALE 2 PUFFS INTO THE LUNGS EVERY 6 HOURS AS NEEDED FOR SHORTNESS OF BREATH OR WHEEZING.  . traZODone (DESYREL) 100 MG tablet Take 100 mg by mouth at bedtime.   . fluticasone (FLONASE) 50 MCG/ACT nasal spray Place 2 sprays into both nostrils daily.  . predniSONE (DELTASONE) 10 MG tablet Take 1 tablet (10 mg total) by mouth daily with breakfast.   No facility-administered encounter medications on file as of 09/18/2017.     Allergies  Allergen Reactions  . Carvedilol Other (See Comments)    Sinus pause on telemetry >3 seconds. Longest one 9 sec. No AV nodal agent  . Lisinopril Anaphylaxis, Shortness Of Breath and Swelling    Angioedema, required intubation and mechanical ventilation  . Amoxicillin Nausea And Vomiting    Nausea only - no allergy    Review of Systems  Constitutional: Positive for fatigue. Negative for diaphoresis and unexpected weight change.  HENT: Positive for rhinorrhea. Negative for congestion and dental problem.   Eyes: Negative for photophobia and visual disturbance.  Respiratory: Positive for cough and shortness of breath.  Negative for wheezing.        Chronic dyspnea.  Has oxygen at home  Cardiovascular: Positive for leg swelling. Negative for chest pain and palpitations.  Gastrointestinal: Negative for constipation and diarrhea.  Genitourinary: Positive for frequency. Negative for dysuria.  Musculoskeletal: Positive for arthralgias and back pain.  Neurological: Negative for dizziness and headaches.  Psychiatric/Behavioral: Negative for dysphoric mood and sleep disturbance. The patient is not nervous/anxious.      BP 122/82 (BP Location: Right Arm, Patient Position: Sitting, Cuff Size: Normal)   Pulse 80   Temp 98.9 F (37.2 C) (Temporal)   Resp (!) 24   Ht 5\' 6"  (1.676 m)   Wt 225 lb 0.6 oz (102.1 kg)   SpO2 97%   BMI 36.32 kg/m   Physical Exam  Constitutional: He is oriented to person, place, and time. He appears well-developed and well-nourished.  Obese.  Smells of tobacco  HENT:  Head: Normocephalic and atraumatic.  Right Ear: External ear normal.  Left Ear: External ear normal.  Nose: Nose normal.  Mouth/Throat: Oropharynx is clear and moist.  Both pinna deformed and thickened.  Clear TMs, clear rhinorrhea  Eyes: Conjunctivae are normal. Pupils are equal, round, and reactive to light.  Neck: Normal range of motion. Neck supple. No thyromegaly present.  Cardiovascular: Normal rate, regular rhythm and normal heart sounds.  Pulmonary/Chest: He has no wheezes. He has no rales.  Dyspneic at rest 22-24, cannot complete long sentence.    Abdominal: Soft. Bowel sounds are normal.  Obese abdomen  Musculoskeletal: Normal range of motion. He exhibits edema.  trace edema.  Tenderness left greater trochanter to palpation.  Hip range of motion normal  Lymphadenopathy:    He has no cervical adenopathy.  Neurological: He is alert and oriented to person, place, and time.  Gait normal  Skin: Skin is warm and dry.  Psychiatric: He has a normal mood and affect. His behavior is normal.  Poor judgement.   Mildly anxious  Nursing note and vitals reviewed.   ASSESSMENT/PLAN:  1. Coronary artery disease involving native coronary artery of native heart with unstable angina pectoris (HCC) Stable.  No angina  2. Essential hypertension Controlled  3. Tobacco use disorder Improving.  Emphasized need to stop  4. Left hip pain Trochanteric bursitis.  Gave a week of prednisone, low-dose.  Ice.  Discussed with physical therapy  5. Non-seasonal allergic rhinitis, unspecified trigger Flonase   Patient Instructions  Take the prednisone once a day for 7 days Use ice to hip twice a day Use the flonase daily This is for sinus congestion Continue to walk every day Need diabetes blood work next month  See me in 3 months    Raylene Everts, MD

## 2017-09-21 ENCOUNTER — Encounter (HOSPITAL_COMMUNITY)
Admission: RE | Admit: 2017-09-21 | Discharge: 2017-09-21 | Disposition: A | Discharge status: Home / Self Care | Payer: Medicaid Other | Source: Ambulatory Visit | Attending: Cardiology | Admitting: Cardiology

## 2017-09-21 DIAGNOSIS — Z955 Presence of coronary angioplasty implant and graft: Secondary | ICD-10-CM

## 2017-09-21 DIAGNOSIS — I214 Non-ST elevation (NSTEMI) myocardial infarction: Secondary | ICD-10-CM | POA: Diagnosis not present

## 2017-09-21 NOTE — Progress Notes (Addendum)
Daily Session Note  Patient Details  Name: Randy Hebert MRN: [id number] Date of Birth: [birth year] Referring Provider:     CARDIAC REHAB PHASE II ORIENTATION from 06/22/2017 in [place]  Referring Provider  Dr. [name]      Encounter Date: 09/21/2017  Check In: Session Check In - 09/21/17 0815      Check-In   Location  [facility]    Staff Present  Russella Dar, MS, EP, [facility], Exercise Physiologist;Debra Wynetta Emery, RN, BSN;Ewel Lona, BS, EP, Exercise Physiologist    Supervising physician immediately available to respond to emergencies  See telemetry face sheet for immediately available MD    Medication changes reported      No    Fall or balance concerns reported     No    Warm-up and Cool-down  Performed as group-led instruction    Resistance Training Performed  Yes    VAD Patient?  No      Pain Assessment   Currently in Pain?  No/denies    Pain Score  0-No pain    Multiple Pain Sites  No       Capillary Blood Glucose: No results found for this or any previous visit (from the past 24 hour(s)).    Social History   Tobacco Use  Smoking Status Current Every Day Smoker  . Packs/day: 1.00  . Years: 46.00  . Pack years: 46.00  . Types: Cigarettes  . Start date: 06/07/1979  Smokeless Tobacco Former Systems developer  . Types: Chew    Goals Met:  Independence with exercise equipment Exercise tolerated well No report of cardiac concerns or symptoms Strength training completed today  Goals Unmet:  Not Applicable  Comments: Check out 915. Patient was put on Prednisone Thursday 09/17/2017, 1 per day. 51m dosage    Dr. SKate Sableis Medical Director [facility].

## 2017-09-23 ENCOUNTER — Encounter (HOSPITAL_COMMUNITY)
Admission: RE | Admit: 2017-09-23 | Discharge: 2017-09-23 | Disposition: A | Discharge status: Home / Self Care | Payer: Medicaid Other | Source: Ambulatory Visit | Attending: Cardiology | Admitting: Cardiology

## 2017-09-23 DIAGNOSIS — Z955 Presence of coronary angioplasty implant and graft: Secondary | ICD-10-CM

## 2017-09-23 DIAGNOSIS — I214 Non-ST elevation (NSTEMI) myocardial infarction: Secondary | ICD-10-CM | POA: Diagnosis not present

## 2017-09-23 NOTE — Progress Notes (Signed)
Daily Session Note  Patient Details  Name: Randy Hebert MRN: [id number] Date of Birth: 18-Dec-1965 Referring Provider:     CARDIAC REHAB PHASE II ORIENTATION from 06/22/2017 in [place]  Referring Provider  Dr. [name]      Encounter Date: 09/23/2017  Check In: Session Check In - 09/23/17 0813      Check-In   Location  [facility]    Staff Present  Russella Dar, MS, EP, [facility], Exercise Physiologist;Debra Wynetta Emery, RN, BSN;Kailynn Satterly, BS, EP, Exercise Physiologist    Supervising physician immediately available to respond to emergencies  See telemetry face sheet for immediately available MD    Medication changes reported      No    Fall or balance concerns reported     No    Warm-up and Cool-down  Performed as group-led instruction    Resistance Training Performed  Yes    VAD Patient?  No      Pain Assessment   Currently in Pain?  No/denies    Pain Score  0-No pain    Multiple Pain Sites  No       Capillary Blood Glucose: No results found for this or any previous visit (from the past 24 hour(s)).    Social History   Tobacco Use  Smoking Status Current Every Day Smoker  . Packs/day: 1.00  . Years: 46.00  . Pack years: 46.00  . Types: Cigarettes  . Start date: 06/07/1979  Smokeless Tobacco Former Systems developer  . Types: Chew    Goals Met:  Independence with exercise equipment Exercise tolerated well No report of cardiac concerns or symptoms Strength training completed today  Goals Unmet:  Not Applicable  Comments: Check out 915   Dr. Kate Sable is Medical Director for [place] and Pulmonary Rehab.

## 2017-09-25 ENCOUNTER — Encounter (HOSPITAL_COMMUNITY)
Admission: RE | Admit: 2017-09-25 | Discharge: 2017-09-25 | Disposition: A | Discharge status: Home / Self Care | Payer: Medicaid Other | Source: Ambulatory Visit | Attending: Cardiology | Admitting: Cardiology

## 2017-09-25 DIAGNOSIS — I214 Non-ST elevation (NSTEMI) myocardial infarction: Secondary | ICD-10-CM | POA: Diagnosis not present

## 2017-09-25 DIAGNOSIS — Z955 Presence of coronary angioplasty implant and graft: Secondary | ICD-10-CM

## 2017-09-25 NOTE — Progress Notes (Signed)
Daily Session Note  Patient Details  Name: Randy Hebert MRN: [id number] Date of Birth: Nov 06, 1965 Referring Provider:     CARDIAC REHAB PHASE II ORIENTATION from 06/22/2017 in [place]  Referring Provider  Dr. [name]      Encounter Date: 09/25/2017  Check In: Session Check In - 09/25/17 0839      Check-In   Location  [facility]    Staff Present  Aundra Dubin, RN, BSN;Terek Bee Luther Parody, BS, EP, Exercise Physiologist;Diane Coad, MS, EP, [facility], Exercise Physiologist    Supervising physician immediately available to respond to emergencies  See telemetry face sheet for immediately available MD    Medication changes reported      No    Fall or balance concerns reported     No    Warm-up and Cool-down  Performed as group-led instruction    Resistance Training Performed  Yes    VAD Patient?  No      Pain Assessment   Currently in Pain?  No/denies    Pain Score  0-No pain    Multiple Pain Sites  No       Capillary Blood Glucose: No results found for this or any previous visit (from the past 24 hour(s)).    Social History   Tobacco Use  Smoking Status Current Every Day Smoker  . Packs/day: 1.00  . Years: 46.00  . Pack years: 46.00  . Types: Cigarettes  . Start date: 06/07/1979  Smokeless Tobacco Former Systems developer  . Types: Chew    Goals Met:  Independence with exercise equipment Exercise tolerated well No report of cardiac concerns or symptoms Strength training completed today  Goals Unmet:  Not Applicable  Comments: Check out 915   Dr. Kate Sable is Medical Director for [place] and Pulmonary Rehab.

## 2017-09-28 ENCOUNTER — Ambulatory Visit (INDEPENDENT_AMBULATORY_CARE_PROVIDER_SITE_OTHER): Payer: Medicaid Other | Admitting: *Deleted

## 2017-09-28 ENCOUNTER — Encounter (HOSPITAL_COMMUNITY)
Admission: RE | Admit: 2017-09-28 | Discharge: 2017-09-28 | Disposition: A | Discharge status: Home / Self Care | Payer: Medicaid Other | Source: Ambulatory Visit | Attending: Cardiology | Admitting: Cardiology

## 2017-09-28 DIAGNOSIS — I214 Non-ST elevation (NSTEMI) myocardial infarction: Secondary | ICD-10-CM

## 2017-09-28 DIAGNOSIS — Z955 Presence of coronary angioplasty implant and graft: Secondary | ICD-10-CM

## 2017-09-28 DIAGNOSIS — R55 Syncope and collapse: Secondary | ICD-10-CM

## 2017-09-28 NOTE — Progress Notes (Signed)
Daily Session Note  Patient Details  Name: Randy Hebert MRN: [id number] Date of Birth: [birth year] Referring Provider:     [place] from 06/22/2017 in [place]  Referring Provider  Dr. [name]      Encounter Date: 09/28/2017  Check In: Session Check In - 09/28/17 0818      Check-In   Location  [facility]    Staff Present  Aundra Dubin, RN, BSN;Diane Coad, MS, EP, [facility], Exercise Physiologist;Josephine Wooldridge Luther Parody, BS, EP, Exercise Physiologist    Supervising physician immediately available to respond to emergencies  See telemetry face sheet for immediately available MD    Medication changes reported      No    Fall or balance concerns reported     No    Warm-up and Cool-down  Performed as group-led instruction    Resistance Training Performed  Yes    VAD Patient?  No      Pain Assessment   Currently in Pain?  No/denies    Pain Score  0-No pain    Multiple Pain Sites  No       Capillary Blood Glucose: No results found for this or any previous visit (from the past 24 hour(s)).    Social History   Tobacco Use  Smoking Status Current Every Day Smoker  . Packs/day: 1.00  . Years: 46.00  . Pack years: 46.00  . Types: Cigarettes  . Start date: 06/07/1979  Smokeless Tobacco Former Systems developer  . Types: Chew    Goals Met:  Independence with exercise equipment Exercise tolerated well No report of cardiac concerns or symptoms Strength training completed today  Goals Unmet:  Not Applicable  Comments: Check out 915   Dr. Kate Sable is Medical Director for [place] and Pulmonary Rehab.

## 2017-09-28 NOTE — Progress Notes (Signed)
[facility] Summary Report / Loop Recorder 

## 2017-09-30 ENCOUNTER — Telehealth: Payer: Self-pay | Admitting: Cardiology

## 2017-09-30 ENCOUNTER — Encounter (HOSPITAL_COMMUNITY)
Admission: RE | Admit: 2017-09-30 | Discharge: 2017-09-30 | Disposition: A | Discharge status: Home / Self Care | Payer: Medicaid Other | Source: Ambulatory Visit | Attending: Cardiology | Admitting: Cardiology

## 2017-09-30 DIAGNOSIS — I214 Non-ST elevation (NSTEMI) myocardial infarction: Secondary | ICD-10-CM

## 2017-09-30 DIAGNOSIS — Z955 Presence of coronary angioplasty implant and graft: Secondary | ICD-10-CM

## 2017-09-30 NOTE — Telephone Encounter (Signed)
Patient cannot stop his aspirin or effient at this time. If dentist is ok doing this on both meds the cleaning is fine.   Carlyle Dolly MD

## 2017-09-30 NOTE — Telephone Encounter (Signed)
Patient needs clearance for deep cleaning per patient request. Please fax to [facility] @ [phone]. Please call [phone] with any questions. Patient walked in to office to say he needed this. / tg

## 2017-09-30 NOTE — Telephone Encounter (Signed)
Faxed [facility] statement from Dr. [name].

## 2017-09-30 NOTE — Progress Notes (Signed)
Daily Session Note  Patient Details  Name: MAJED PELLEGRIN MRN: [id number] Date of Birth: [birth year] Referring Provider:     CARDIAC REHAB PHASE II ORIENTATION from 06/22/2017 in [place]  Referring Provider  Dr. [name]      Encounter Date: 09/30/2017  Check In: Session Check In - 09/30/17 0810      Check-In   Location  [facility]    Staff Present  Aundra Dubin, RN, BSN;Diane Coad, MS, EP, [facility], Exercise Physiologist;Laiya Wisby Luther Parody, BS, EP, Exercise Physiologist    Supervising physician immediately available to respond to emergencies  See telemetry face sheet for immediately available MD    Medication changes reported      No    Fall or balance concerns reported     No    Warm-up and Cool-down  Performed as group-led instruction    Resistance Training Performed  Yes    VAD Patient?  No      Pain Assessment   Currently in Pain?  No/denies    Pain Score  0-No pain    Multiple Pain Sites  No       Capillary Blood Glucose: No results found for this or any previous visit (from the past 24 hour(s)).  Exercise Prescription Changes - 09/29/17 1200      Response to Exercise   Blood Pressure (Admit)  138/82    Blood Pressure (Exercise)  164/70    Blood Pressure (Exit)  132/68    Heart Rate (Admit)  74 bpm    Heart Rate (Exercise)  109 bpm    Heart Rate (Exit)  100 bpm    Rating of Perceived Exertion (Exercise)  13    Duration  Progress to 30 minutes of  aerobic without signs/symptoms of physical distress    Intensity  THRR New 112-130-149      Progression   Progression  Continue to progress workloads to maintain intensity without signs/symptoms of physical distress.      Resistance Training   Training Prescription  Yes    Weight  4    Reps  10-15      Treadmill   MPH  1.6    Grade  0    Minutes  15    METs  2.2      NuStep   Level  3    SPM  101    Minutes  20    METs  3.7      Home Exercise Plan   Plans to continue  exercise at  Home (comment)    Frequency  Add 2 additional days to program exercise sessions.    Initial Home Exercises Provided  07/08/17       Social History   Tobacco Use  Smoking Status Current Every Day Smoker  . Packs/day: 1.00  . Years: 46.00  . Pack years: 46.00  . Types: Cigarettes  . Start date: 06/07/1979  Smokeless Tobacco Former Systems developer  . Types: Chew    Goals Met:  Independence with exercise equipment Exercise tolerated well No report of cardiac concerns or symptoms Strength training completed today  Goals Unmet:  Not Applicable  Comments: Check out 915   Dr. Kate Sable is Medical Director for [place] and Pulmonary Rehab.

## 2017-09-30 NOTE — Telephone Encounter (Signed)
I will forward to Dr [name]

## 2017-10-02 ENCOUNTER — Encounter (HOSPITAL_COMMUNITY)
Admission: RE | Admit: 2017-10-02 | Discharge: 2017-10-02 | Disposition: A | Discharge status: Home / Self Care | Payer: Medicaid Other | Source: Ambulatory Visit | Attending: Cardiology | Admitting: Cardiology

## 2017-10-02 DIAGNOSIS — Z955 Presence of coronary angioplasty implant and graft: Secondary | ICD-10-CM

## 2017-10-02 DIAGNOSIS — F17210 Nicotine dependence, cigarettes, uncomplicated: Secondary | ICD-10-CM | POA: Insufficient documentation

## 2017-10-02 DIAGNOSIS — I214 Non-ST elevation (NSTEMI) myocardial infarction: Secondary | ICD-10-CM | POA: Insufficient documentation

## 2017-10-02 NOTE — Progress Notes (Signed)
Daily Session Note  Patient Details  Name: Randy Hebert MRN: [id number] Date of Birth: [birth year] Referring Provider:     CARDIAC REHAB PHASE II ORIENTATION from 06/22/2017 in [place]  Referring Provider  Dr. [name]      Encounter Date: 10/02/2017  Check In: Session Check In - 10/02/17 0811      Check-In   Location  [facility]    Staff Present  Russella Dar, MS, EP, [facility], Exercise Physiologist;Debra Wynetta Emery, RN, BSN;Meryem Haertel, BS, EP, Exercise Physiologist    Supervising physician immediately available to respond to emergencies  See telemetry face sheet for immediately available MD    Medication changes reported      No    Fall or balance concerns reported     No    Warm-up and Cool-down  Performed as group-led instruction    Resistance Training Performed  Yes    VAD Patient?  No      Pain Assessment   Currently in Pain?  No/denies    Pain Score  0-No pain    Multiple Pain Sites  No       Capillary Blood Glucose: No results found for this or any previous visit (from the past 24 hour(s)).    Social History   Tobacco Use  Smoking Status Current Every Day Smoker  . Packs/day: 1.00  . Years: 46.00  . Pack years: 46.00  . Types: Cigarettes  . Start date: 06/07/1979  Smokeless Tobacco Former Systems developer  . Types: Chew    Goals Met:  Independence with exercise equipment Exercise tolerated well No report of cardiac concerns or symptoms Strength training completed today  Goals Unmet:  Not Applicable  Comments: Check out 915   Dr. Kate Sable is Medical Director for [place] and Pulmonary Rehab.

## 2017-10-05 ENCOUNTER — Encounter (HOSPITAL_COMMUNITY)
Admission: RE | Admit: 2017-10-05 | Discharge: 2017-10-05 | Disposition: A | Discharge status: Home / Self Care | Payer: Medicaid Other | Source: Ambulatory Visit | Attending: Cardiology | Admitting: Cardiology

## 2017-10-05 DIAGNOSIS — Z955 Presence of coronary angioplasty implant and graft: Secondary | ICD-10-CM

## 2017-10-05 DIAGNOSIS — I214 Non-ST elevation (NSTEMI) myocardial infarction: Secondary | ICD-10-CM

## 2017-10-05 NOTE — Progress Notes (Signed)
Daily Session Note  Patient Details  Name: Randy Hebert MRN: [id number] Date of Birth: 02/13/1966 Referring Provider:     CARDIAC REHAB PHASE II ORIENTATION from 06/22/2017 in [place]  Referring Provider  Dr. [name]      Encounter Date: 10/05/2017  Check In: Session Check In - 10/05/17 0817      Check-In   Location  [facility]    Staff Present  Russella Dar, MS, EP, [facility], Exercise Physiologist;Debra Wynetta Emery, RN, BSN;Tab Rylee, BS, EP, Exercise Physiologist    Supervising physician immediately available to respond to emergencies  See telemetry face sheet for immediately available MD    Medication changes reported      No    Fall or balance concerns reported     No    Warm-up and Cool-down  Performed as group-led instruction    Resistance Training Performed  Yes    VAD Patient?  No      Pain Assessment   Currently in Pain?  No/denies    Pain Score  0-No pain    Multiple Pain Sites  No       Capillary Blood Glucose: No results found for this or any previous visit (from the past 24 hour(s)).    Social History   Tobacco Use  Smoking Status Current Every Day Smoker  . Packs/day: 1.00  . Years: 46.00  . Pack years: 46.00  . Types: Cigarettes  . Start date: 06/07/1979  Smokeless Tobacco Former Systems developer  . Types: Chew    Goals Met:  Independence with exercise equipment Exercise tolerated well No report of cardiac concerns or symptoms Strength training completed today  Goals Unmet:  Not Applicable  Comments: Check out 915   Dr. Kate Sable is Medical Director for [place] and Pulmonary Rehab.

## 2017-10-07 ENCOUNTER — Encounter (HOSPITAL_COMMUNITY)
Admission: RE | Admit: 2017-10-07 | Discharge: 2017-10-07 | Disposition: A | Discharge status: Home / Self Care | Payer: Medicaid Other | Source: Ambulatory Visit | Attending: Cardiology | Admitting: Cardiology

## 2017-10-07 DIAGNOSIS — I214 Non-ST elevation (NSTEMI) myocardial infarction: Secondary | ICD-10-CM

## 2017-10-07 DIAGNOSIS — Z955 Presence of coronary angioplasty implant and graft: Secondary | ICD-10-CM

## 2017-10-07 NOTE — Progress Notes (Signed)
Daily Session Note  Patient Details  Name: DOMINIE BENEDICK MRN: [id number] Date of Birth: 1966/07/07 Referring Provider:     [place] from 06/22/2017 in [place]  Referring Provider  Dr. [name]      Encounter Date: 10/07/2017  Check In: Session Check In - 10/07/17 0825      Check-In   Location  [facility]    Staff Present  Diane Angelina Pih, MS, EP, [facility], Exercise Physiologist;Debra Wynetta Emery, RN, BSN;Jovaughn Wojtaszek, BS, EP, Exercise Physiologist    Supervising physician immediately available to respond to emergencies  See telemetry face sheet for immediately available MD    Medication changes reported      No    Fall or balance concerns reported     No    Warm-up and Cool-down  Performed as group-led instruction    Resistance Training Performed  Yes    VAD Patient?  No      Pain Assessment   Currently in Pain?  No/denies    Pain Score  0-No pain    Multiple Pain Sites  No       Capillary Blood Glucose: No results found for this or any previous visit (from the past 24 hour(s)).    Social History   Tobacco Use  Smoking Status Current Every Day Smoker  . Packs/day: 1.00  . Years: 46.00  . Pack years: 46.00  . Types: Cigarettes  . Start date: 06/07/1979  Smokeless Tobacco Former Systems developer  . Types: Chew    Goals Met:  Independence with exercise equipment Exercise tolerated well No report of cardiac concerns or symptoms Strength training completed today  Goals Unmet:  Not Applicable  Comments: Check out 915   Dr. Kate Sable is Medical Director for [place] and Pulmonary Rehab.

## 2017-10-08 ENCOUNTER — Other Ambulatory Visit: Payer: Self-pay | Admitting: Cardiology

## 2017-10-08 LAB — CUP PACEART REMOTE DEVICE CHECK
Date Time Interrogation Session: 20190127213802
MDC IDC PG IMPLANT DT: 20181029

## 2017-10-08 NOTE — Progress Notes (Signed)
Cardiac Individual Treatment Plan  Patient Details  Name: Randy Hebert MRN: [id number] Date of Birth: 1966/08/06 Referring Provider:     CARDIAC REHAB [place] from 06/22/2017 in [place]  Referring Provider  Dr. [name]      Initial Encounter Date:    CARDIAC REHAB PHASE II ORIENTATION from 06/22/2017 in [place]  Date  06/22/17  Referring Provider  Dr. [name]      Visit Diagnosis: NSTEMI (non-ST elevated myocardial infarction) [facility])  Status post coronary artery stent placement  Patient's Home Medications on Admission:  Current Outpatient Medications:  .  acetaminophen (TYLENOL) 325 MG tablet, Take 650 mg by mouth every 6 (six) hours as needed for mild pain., Disp: , Rfl:  .  albuterol (PROVENTIL) (2.5 MG/3ML) 0.083% nebulizer solution, Take 3 mLs (2.5 mg total) by nebulization every 6 (six) hours as needed for wheezing or shortness of breath., Disp: 150 mL, Rfl: 1 .  aspirin 81 MG EC tablet, Take 1 tablet (81 mg total) by mouth daily., Disp: 30 tablet, Rfl:  .  atorvastatin (LIPITOR) 80 MG tablet, Take 80 mg by mouth daily., Disp: , Rfl:  .  Carboxymeth-Glycerin-Polysorb (REFRESH OPTIVE ADVANCED) 0.5-1-0.5 % SOLN, Apply 1 drop to eye 2 (two) times daily as needed (dry eyes). , Disp: , Rfl:  .  citalopram (CELEXA) 20 MG tablet, Take 20 mg daily by mouth., Disp: , Rfl:  .  cycloSPORINE (RESTASIS) 0.05 % ophthalmic emulsion, Place 1 drop into both eyes 2 (two) times daily., Disp: , Rfl:  .  fluticasone (FLONASE) 50 MCG/ACT nasal spray, Place 2 sprays into both nostrils daily., Disp: 16 g, Rfl: 6 .  furosemide (LASIX) 40 MG tablet, Take 1 tablet (40 mg total) by mouth daily., Disp: 90 tablet, Rfl: 3 .  glucose blood (IGLUCOSE TEST STRIPS) test strip, 1 each by Other route 2 (two) times daily. Use as instructed, Disp: 100 each, Rfl: 12 .  Magnesium 200 MG TABS, Take 1 tablet (200 mg total) by mouth daily., Disp: 30 each,  Rfl: 6 .  metFORMIN (GLUCOPHAGE) 1000 MG tablet, Take 1 tablet (1,000 mg total) by mouth 2 (two) times daily with a meal., Disp: 180 tablet, Rfl: 3 .  mometasone-formoterol (DULERA) 100-5 MCG/ACT AERO, Inhale 2 puffs into the lungs 2 (two) times daily., Disp: 3 Inhaler, Rfl: 3 .  nicotine (NICODERM CQ - DOSED IN MG/24 HOURS) 21 mg/24hr patch, Place 1 patch (21 mg total) onto the skin daily. (Patient taking differently: Place 21 mg onto the skin daily. ), Disp: 28 patch, Rfl: 0 .  nitroGLYCERIN (NITROSTAT) 0.4 MG SL tablet, Place 1 tablet (0.4 mg total) under the tongue every 5 (five) minutes as needed for chest pain., Disp: 25 tablet, Rfl: 3 .  Omega-3 Fatty Acids (FISH OIL) 1200 MG CAPS, Take 1,200 mg by mouth 2 (two) times daily., Disp: , Rfl:  .  pantoprazole (PROTONIX) 40 MG tablet, Take 1 tablet (40 mg total) by mouth daily., Disp: 30 tablet, Rfl: 11 .  potassium chloride SA (K-DUR,KLOR-CON) 20 MEQ tablet, Take 1 tablet (20 mEq total) by mouth daily., Disp: 30 tablet, Rfl: 3 .  prasugrel (EFFIENT) 10 MG TABS tablet, TAKE 1 TABLET BY MOUTH ONCE A DAY., Disp: 30 tablet, Rfl: 10 .  predniSONE (DELTASONE) 10 MG tablet, Take 1 tablet (10 mg total) by mouth daily with breakfast., Disp: 7 tablet, Rfl: 0 .  PROVENTIL HFA 108 (90 Base) MCG/ACT inhaler, INHALE 2 PUFFS INTO  THE LUNGS EVERY 6 HOURS AS NEEDED FOR SHORTNESS OF BREATH OR WHEEZING., Disp: 18 g, Rfl: 1 .  traZODone (DESYREL) 100 MG tablet, Take 100 mg by mouth at bedtime. , Disp: , Rfl:   Past Medical History: Past Medical History:  Diagnosis Date  . Allergy   . Anxiety   . Arthritis   . Asthma    hip replacement  . Back pain   . Bulging of cervical intervertebral disc   . CAD (coronary artery disease)    lateral STEMI 02/06/2015 00% D1 occlusion treated with Promus Premier 2.5 mm x 16 mm DES, 70% ramus stenosis, 40% mid RCA stenosis, 45% distal RCA stenosis, EF 45-50%  . CHF (congestive heart failure) ([place])   . COPD (chronic obstructive  pulmonary disease) ([place])   . Depression   . Diabetes mellitus without complication ([place])   . 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 Dry eye   . Dyspnea   . Early satiety 09/23/2016  . GERD (gastroesophageal reflux disease)   . Headache   . Heart murmur   . Hip pain   . Hyperlipidemia   . Hypertension   . MI (myocardial infarction) ([place])   . Myocardial infarction ([place])   . Neck pain   . Otitis media   . Pleurisy   . Sinus pause    9 sec sinus pause on telemetry after started on coreg after MI, avoid AV nodal blocking agent  . Sleep apnea   . Substance abuse ([place]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  . Unilateral primary osteoarthritis, right hip 07/01/2016    Tobacco Use: Social History   Tobacco Use  Smoking Status Current Every Day Smoker  . Packs/day: 1.00  . Years: 46.00  . Pack years: 46.00  . Types: Cigarettes  . Start date: 06/07/1979  Smokeless Tobacco Former Systems developer  . Types: Chew    Labs: Recent Review Flowsheet Data    Labs for ITP Cardiac and Pulmonary Rehab Latest Ref Rng & Units 05/20/2016 08/05/2016 11/11/2016 04/15/2017 04/27/2017   Cholestrol 0 - 200 mg/dL 128 132 112 134 93   LDLCALC 0 - 99 mg/dL 60 56 49 71 34   HDL >40 mg/dL 24(L) 22(L) 20(L) 24(L) 18(L)   Trlycerides <150 mg/dL 222(H) 272(H) 214(H) 193(H) 206(H)   Hemoglobin A1c <5.7 % 8.0(H) 6.3(H) 6.8(H) 7.6(H) -   PHART 7.350 - 7.450 - - - - -   PCO2ART 35.0 - 45.0 mmHg - - - - -   HCO3 20.0 - 24.0 mEq/L - - - - -   TCO2 0 - 100 mmol/L - - - - -   O2SAT % - - - - -      Capillary Blood Glucose: Lab Results  Component Value Date   GLUCAP 138 (H) 06/30/2017   GLUCAP 274 (H) 06/29/2017   GLUCAP 135 (H) 06/29/2017   GLUCAP 147 (H) 06/29/2017   GLUCAP 139 (H) 06/29/2017     Exercise Target Goals:    Exercise Program Goal: Individual exercise prescription set using results from initial 6 min walk test and THRR while considering  patient's activity barriers and safety.    Exercise Prescription Goal: Starting with aerobic activity 30 plus minutes a day, 3 days per week for initial exercise prescription. Provide home exercise prescription and guidelines that participant acknowledges understanding prior to discharge.  Activity Barriers & Risk Stratification: Activity Barriers & Cardiac Risk Stratification - 06/22/17 1340  Activity Barriers & Cardiac Risk Stratification   Activity Barriers  Arthritis;Back Problems;Right Hip Replacement;Joint Problems;Deconditioning;Muscular Weakness;Shortness of Breath;Balance Concerns    Cardiac Risk Stratification  High       6 Minute Walk: 6 Minute Walk    Row Name 06/22/17 1340         6 Minute Walk   Phase  Initial     Distance  1000 feet     Distance % Change  0 %     Distance Feet Change  0 ft     Walk Time  6 minutes     # of Rest Breaks  0     MPH  1.89     METS  1.59     RPE  10     Perceived Dyspnea   13     VO2 Peak  11.3     Symptoms  No     Resting HR  77 bpm     Resting BP  124/88     Resting Oxygen Saturation   95 %     Exercise Oxygen Saturation  during 6 min walk  91 %     Max Ex. HR  103 bpm     Max Ex. BP  184/100     2 Minute Post BP  126/84        Oxygen Initial Assessment:   Oxygen Re-Evaluation: Oxygen Re-Evaluation    Row Name 08/19/17 1302 10/08/17 1553           Program Oxygen Prescription   Program Oxygen Prescription  None  None        Home Oxygen   Home Oxygen Device  Home Concentrator  Home Concentrator      Sleep Oxygen Prescription  CPAP  CPAP      Liters per minute  2  2      Home Exercise Oxygen Prescription  None  None      Home at Rest Exercise Oxygen Prescription  None  None      Compliance with Home Oxygen Use  Yes  Yes        Goals/Expected Outcomes   Short Term Goals  To learn and exhibit compliance with exercise, home and travel O2 prescription  To learn and exhibit compliance with exercise, home and travel O2 prescription      Long  Term  Goals  Exhibits compliance with exercise, home and travel O2 prescription  Exhibits compliance with exercise, home and travel O2 prescription      Comments  Patient uses O2 with CPAP at night at 2 L/Min. He is complaint with his prescription.  Patient uses O2 with CPAP at night at 2 L/Min. He is complaint with his prescription.      Goals/Expected Outcomes  Patient will continue to be complaint with his O2/CPAP prescription.   Patient will continue to be complaint with his O2/CPAP prescription.          Oxygen Discharge (Final Oxygen Re-Evaluation): Oxygen Re-Evaluation - 10/08/17 1553      Program Oxygen Prescription   Program Oxygen Prescription  None      Home Oxygen   Home Oxygen Device  Home Concentrator    Sleep Oxygen Prescription  CPAP    Liters per minute  2    Home Exercise Oxygen Prescription  None    Home at Rest Exercise Oxygen Prescription  None    Compliance with Home Oxygen Use  Yes  Goals/Expected Outcomes   Short Term Goals  To learn and exhibit compliance with exercise, home and travel O2 prescription    Long  Term Goals  Exhibits compliance with exercise, home and travel O2 prescription    Comments  Patient uses O2 with CPAP at night at 2 L/Min. He is complaint with his prescription.    Goals/Expected Outcomes  Patient will continue to be complaint with his O2/CPAP prescription.        Initial Exercise Prescription: Initial Exercise Prescription - 06/22/17 1300      Date of Initial Exercise RX and Referring Provider   Date  06/22/17    Referring Provider  Dr. [name]      NuStep   Level  2    SPM  50    Minutes  20    METs  1.7      Arm Ergometer   Level  1.5    Watts  12    RPM  12    Minutes  15    METs  1.9      Prescription Details   Frequency (times per week)  3    Duration  Progress to 30 minutes of continuous aerobic without signs/symptoms of physical distress      Intensity   THRR 40-80% of Max Heartrate  113-132-150    Ratings  of Perceived Exertion  11-13    Perceived Dyspnea  0-4      Progression   Progression  Continue progressive overload as per policy without signs/symptoms or physical distress.      Resistance Training   Training Prescription  Yes    Weight  1    Reps  10-15       Perform Capillary Blood Glucose checks as needed.  Exercise Prescription Changes:  Exercise Prescription Changes    Row Name 07/08/17 0800 07/15/17 1400 07/28/17 0800 08/18/17 1000 09/02/17 0700     Response to Exercise   Blood Pressure (Admit)  -  142/74  132/76  148/80  142/80   Blood Pressure (Exercise)  -  150/78  156/84  150/80  168/70   Blood Pressure (Exit)  -  138/74  128/72  130/80  128/74   Heart Rate (Admit)  -  91 bpm  92 bpm  82 bpm  83 bpm   Heart Rate (Exercise)  -  107 bpm  112 bpm  108 bpm  106 bpm   Heart Rate (Exit)  -  98 bpm  102 bpm  92 bpm  90 bpm   Rating of Perceived Exertion (Exercise)  -  _0 Duration  -  Progress to 30 minutes of  aerobic without signs/symptoms of physical distress  Progress to 30 minutes of  aerobic without signs/symptoms of physical distress  Progress to 30 minutes of  aerobic without signs/symptoms of physical distress  Progress to 30 minutes of  aerobic without signs/symptoms of physical distress   Intensity  -  THRR New 122-137-153  THRR New 122-138-153  THRR New 116-134-151  THRR New [phone]     Progression   Progression  -  Continue to progress workloads to maintain intensity without signs/symptoms of physical distress.  Continue to progress workloads to maintain intensity without signs/symptoms of physical distress.  Continue to progress workloads to maintain intensity without signs/symptoms of physical distress.  Continue to progress workloads to maintain intensity without signs/symptoms of physical distress.     Resistance Training  Training Prescription  Yes  Yes  Yes  Yes  Yes   Weight  _0 Reps  10-15  10-15  10-15  10-15  10-15      Treadmill   MPH  -  1.2  1.4  1.5  1.5   Grade  -  0  0  0  0   Minutes  -  _1 METs  -  1.9  2  2.1  2.1     NuStep   Level  _2 SPM  50  105  133  117  112   Minutes  _3 METs  1.7  3.4  5.5  3.5  3.7     Arm Ergometer   Level  1.5  -  -  -  -   Watts  12  -  -  -  -   RPM  12  -  -  -  -   Minutes  15  -  -  -  -   METs  1.9  -  -  -  -     Home Exercise Plan   Plans to continue exercise at  Home (comment)  Home (comment)  Home (comment)  Home (comment)  Home (comment)   Frequency  Add 2 additional days to program exercise sessions.  Add 2 additional days to program exercise sessions.  Add 2 additional days to program exercise sessions.  Add 2 additional days to program exercise sessions.  Add 2 additional days to program exercise sessions.   Initial Home Exercises Provided  07/08/17  07/08/17  07/08/17  07/08/17  07/08/17   Row Name 09/14/17 1100 09/29/17 1200 10/08/17 1500         Response to Exercise   Blood Pressure (Admit)  140/74  138/82  144/70     Blood Pressure (Exercise)  156/80  164/70  150/64     Blood Pressure (Exit)  124/68  132/68  140/70     Heart Rate (Admit)  89 bpm  74 bpm  86 bpm     Heart Rate (Exercise)  115 bpm  109 bpm  103 bpm     Heart Rate (Exit)  93 bpm  100 bpm  95 bpm     Rating of Perceived Exertion (Exercise)  _4 Duration  Progress to 30 minutes of  aerobic without signs/symptoms of physical distress  Progress to 30 minutes of  aerobic without signs/symptoms of physical distress  Progress to 30 minutes of  aerobic without signs/symptoms of physical distress     Intensity  THRR New 121-136-152  THRR New 112-130-149  THRR New [phone]       Progression   Progression  Continue to progress workloads to maintain intensity without signs/symptoms of physical distress.  Continue to progress workloads to maintain intensity without signs/symptoms of physical distress.  Continue to progress  workloads to maintain intensity without signs/symptoms of physical distress.       Resistance Training   Training Prescription  Yes  Yes  Yes     Weight  _5 Reps  10-15  10-15  10-15       Treadmill   MPH  1.6  1.6  1.6     Grade  0  0  0     Minutes  _0 METs  2.2  2.2  2.2       NuStep   Level  _1 SPM  115  101  97     Minutes  _2 METs  3.7  3.7  3.2       Home Exercise Plan   Plans to continue exercise at  Home (comment)  Home (comment)  Home (comment)     Frequency  Add 2 additional days to program exercise sessions.  Add 2 additional days to program exercise sessions.  Add 2 additional days to program exercise sessions.     Initial Home Exercises Provided  07/08/17  07/08/17  07/08/17        Exercise Comments:  Exercise Comments    Row Name 06/25/17 1529 07/08/17 0836 07/28/17 0816 08/18/17 1003 09/02/17 0748   Exercise Comments  Patient has not yet started CR and will be progressed in time.   Patient received the take home exercise plan today 07/08/2017. THR was addressed as were safe ways to be active outside of Rehab. Patient demonstrated an understanding. Patient was encouraged to ask any future questions.   Patient is doing very well in CR. He has increased his spee on the treadmill and has increased his watts and METs very high in the Nustep machine. He also has a lower blood pressure than before. He also has gone up in weight for teh warm up to two pounds.   Patient is doing very well in CR. He has increased his speed on the treadmill as well as his level on the Nustep. He does not use the handle bars on the Nustep due to shoulder pain. He states that this program has helped him gain endurance and stamina enough to walk and play with his dog. He has also decreased his usage of cigarrettes.  Patient is doing well in CR and has maintained his levels and SPMs on the machines. He states that he feels that he is getting stronger and more  stamina.    [place] Name 09/14/17 1115 09/29/17 1236         Exercise Comments  Patient is doing very well in CR he has increased his speed on the treadmill an dhas maintained his level on the Nustep. He states that he continues to walk his dog regularly and has more energy for that as well.  Patient is doing well in CR. He continues to maintain his levels on his machines. Patient has moved up on weights for the warm up routine.          Exercise Goals and Review:  Exercise Goals    Row Name 06/22/17 1342             Exercise Goals   Increase Physical Activity  Yes       Intervention  Provide advice, education, support and counseling about physical activity/exercise needs.;Develop an individualized exercise prescription for aerobic and resistive training based on initial evaluation findings, risk stratification, comorbidities and participant's personal goals.       Expected Outcomes  Achievement of increased cardiorespiratory fitness and enhanced flexibility, muscular endurance and strength shown through measurements of functional capacity and personal statement of participant.       Increase Strength  and Stamina  Yes       Intervention  Provide advice, education, support and counseling about physical activity/exercise needs.;Develop an individualized exercise prescription for aerobic and resistive training based on initial evaluation findings, risk stratification, comorbidities and participant's personal goals.       Expected Outcomes  Achievement of increased cardiorespiratory fitness and enhanced flexibility, muscular endurance and strength shown through measurements of functional capacity and personal statement of participant.       Able to understand and use rate of perceived exertion (RPE) scale  Yes       Intervention  Provide education and explanation on how to use RPE scale       Expected Outcomes  Short Term: Able to use RPE daily in rehab to express subjective intensity level;Long  Term:  Able to use RPE to guide intensity level when exercising independently       Able to understand and use Dyspnea scale  Yes       Intervention  Provide education and explanation on how to use Dyspnea scale       Expected Outcomes  Short Term: Able to use Dyspnea scale daily in rehab to express subjective sense of shortness of breath during exertion;Long Term: Able to use Dyspnea scale to guide intensity level when exercising independently       Knowledge and understanding of Target Heart Rate Range (THRR)  Yes       Intervention  Provide education and explanation of THRR including how the numbers were predicted and where they are located for reference       Expected Outcomes  Short Term: Able to state/look up THRR       Able to check pulse independently  Yes       Intervention  Provide education and demonstration on how to check pulse in carotid and radial arteries.;Review the importance of being able to check your own pulse for safety during independent exercise       Expected Outcomes  Short Term: Able to explain why pulse checking is important during independent exercise;Long Term: Able to check pulse independently and accurately       Understanding of Exercise Prescription  Yes       Intervention  Provide education, explanation, and written materials on patient's individual exercise prescription       Expected Outcomes  Short Term: Able to explain program exercise prescription;Long Term: Able to explain home exercise prescription to exercise independently          Exercise Goals Re-Evaluation : Exercise Goals Re-Evaluation    Row Name 07/28/17 0814 08/18/17 1001 09/14/17 1113 10/08/17 1511       Exercise Goal Re-Evaluation   Exercise Goals Review  Increase Physical Activity;Increase Strength and Stamina;Knowledge and understanding of Target Heart Rate Range (THRR)  Increase Physical Activity;Increase Strength and Stamina;Knowledge and understanding of Target Heart Rate Range (THRR)   Increase Physical Activity;Increase Strength and Stamina;Knowledge and understanding of Target Heart Rate Range (THRR)  Increase Physical Activity;Increase Strength and Stamina;Knowledge and understanding of Target Heart Rate Range (THRR)    Comments  Patient is doing very well in CR. He has increased his spee on the treadmill and has increased his watts and METs very high in the Nustep machine. He also has a lower blood pressure than before. He also has gone up in weight for teh warm up to two pounds.   Patient is doing very well in CR. He has increased his speed on the treadmill as  well as his level on the Nustep. He does not use the handle bars on the Nustep due to shoulder pain. He states that this program has helped him gain endurance and stamina enough to walk and play with his dog. He has also decreased his usage of cigarrettes.   Patient is doing very well in CR he has increased his speed on the treadmill an dhas maintained his level on the Nustep. He states that he continues to walk his dog regularly and has more energy for that as well.   Patient continues to improve in CR. Patient maintains his levels on all machines and states that he is active on days that he is not present in CR. Patient has shown great improvement throughout the program and is attempting to quit smoking.     Expected Outcomes  Patient wishes to breathe better and to lose weight.   Patient wishes to breathe better and to lose weight over time.   Patient wishes to be able to breathe better and to lose weight.   Patient wishes to be able to breathe better and to lose weight.         Discharge Exercise Prescription (Final Exercise Prescription Changes): Exercise Prescription Changes - 10/08/17 1500      Response to Exercise   Blood Pressure (Admit)  144/70    Blood Pressure (Exercise)  150/64    Blood Pressure (Exit)  140/70    Heart Rate (Admit)  86 bpm    Heart Rate (Exercise)  103 bpm    Heart Rate (Exit)  95 bpm     Rating of Perceived Exertion (Exercise)  12    Duration  Progress to 30 minutes of  aerobic without signs/symptoms of physical distress    Intensity  THRR New [phone]      Progression   Progression  Continue to progress workloads to maintain intensity without signs/symptoms of physical distress.      Resistance Training   Training Prescription  Yes    Weight  4    Reps  10-15      Treadmill   MPH  1.6    Grade  0    Minutes  15    METs  2.2      NuStep   Level  3    SPM  97    Minutes  20    METs  3.2      Home Exercise Plan   Plans to continue exercise at  Home (comment)    Frequency  Add 2 additional days to program exercise sessions.    Initial Home Exercises Provided  07/08/17       Nutrition:  Target Goals: Understanding of nutrition guidelines, daily intake of sodium <1540m, cholesterol <2025m calories 30% from fat and 7% or less from saturated fats, daily to have 5 or more servings of fruits and vegetables.  Biometrics: Pre Biometrics - 06/22/17 1343      Pre Biometrics   Height  _0  (1.676 m)    Weight  230 lb 9.6 oz (104.6 kg)    Waist Circumference  50 inches    Hip Circumference  43 inches    Waist to Hip Ratio  1.16 %    BMI (Calculated)  37.24    Triceps Skinfold  6 mm    % Body Fat  32 %    Grip Strength  62.33 kg    Flexibility  0 in    Single Leg Stand  15 seconds        Nutrition Therapy Plan and Nutrition Goals: Nutrition Therapy & Goals - 08/06/17 1518      Personal Nutrition Goals   Nutrition Goal  For heart healthy choices add >50% of whole grains, make half their plate fruits and vegetables. Discuss the difference between starchy vegetables and leafy greens, and how leafy vegetables provide fiber, helps maintain healthy weight, helps control blood glucose, and lowers cholesterol.  Discuss purchasing fresh or frozen vegetable to reduce sodium and not to add grease, fat or sugar. Consume <18oz of red meat per week. Consume lean cuts  of meats and very little of meats high in sodium and nitrates such as pork and lunch meats. Discussed portion control for all food groups.      Personal Goal #2  Continues to eat heart healthy low NA diabetic diet.     Additional Goals?  No    Comments  Patient attended nutrition class.       Intervention Plan   Intervention  Prescribe, educate and counsel regarding individualized specific dietary modifications aiming towards targeted core components such as weight, hypertension, lipid management, diabetes, heart failure and other comorbidities.;Nutrition handout(s) given to patient.    Expected Outcomes  Short Term Goal: Understand basic principles of dietary content, such as calories, fat, sodium, cholesterol and nutrients.       Nutrition Assessments: Nutrition Assessments - 06/22/17 1526      MEDFICTS Scores   Pre Score  64       Nutrition Goals Re-Evaluation: Nutrition Goals Re-Evaluation    [place] Name 08/19/17 1305 09/14/17 1328 10/08/17 1526         Goals   Current Weight  227 lb (103 kg)  222 lb 1.6 oz (100.7 kg)  218 lb 8 oz (99.1 kg)     Nutrition Goal  For heart healthy choices add >50% of whole grains, make half their plate fruits and vegetables. Discuss the difference between starchy vegetables and leafy greens, and how leafy vegetables provide fiber, helps maintain healthy weight, helps control blood glucose, and lowers cholesterol.  Discuss purchasing fresh or frozen vegetable to reduce sodium and not to add grease, fat or sugar. Consume <18oz of red meat per week. Consume lean cuts of meats and very little of meats high in sodium and nitrates such as pork and lunch meats. Discussed portion control for all food groups.  For heart healthy choices add >50% of whole grains, make half their plate fruits and vegetables. Discuss the difference between starchy vegetables and leafy greens, and how leafy vegetables provide fiber, helps maintain healthy weight, helps control blood  glucose, and lowers cholesterol.  Discuss purchasing fresh or frozen vegetable to reduce sodium and not to add grease, fat or sugar. Consume <18oz of red meat per week. Consume lean cuts of meats and very little of meats high in sodium and nitrates such as pork and lunch meats. Discussed portion control for all food groups.  For heart healthy choices add >50% of whole grains, make half their plate fruits and vegetables. Discuss the difference between starchy vegetables and leafy greens, and how leafy vegetables provide fiber, helps maintain healthy weight, helps control blood glucose, and lowers cholesterol.  Discuss purchasing fresh or frozen vegetable to reduce sodium and not to add grease, fat or sugar. Consume <18oz of red meat per week. Consume lean cuts of meats and very little of meats high in sodium and nitrates such as pork and lunch meats.  Discussed portion control for all food groups.     Comment  Patient has lost 3 lbs and is continuing to follow a heart healthy, low Na and diabetic diet. He says he is meeting with a dietician tomorrow to start on a reduced calorie diet. Will continue to monitor.   Patient has lost 8 lbs and is continuing to follow a heart healthy, low Na and diabetic diet. He met with dietician and is trying to follow a reduced calorie diet Will continue to monitor.   Patient has lost 12.5 lbs since he started the program. He continues to follow a reduced calorie diet. Will continue to monitor for progress.      Expected Outcome  Patient will continue to meet his nutrtion goals at discharge.   Patient will continue to meet his nutrtion goals at discharge.   Patient will continue to meet his nutrtion goals at discharge.        Personal Goal #2 Re-Evaluation   Personal Goal #2  Eat heart healthy/ low na and diabetic diet.   Eat heart healthy/ low na and diabetic, reduced calorie diet.   Eat heart healthy/ low na and diabetic, reduced calorie diet.         Nutrition Goals  Discharge (Final Nutrition Goals Re-Evaluation): Nutrition Goals Re-Evaluation - 10/08/17 1526      Goals   Current Weight  218 lb 8 oz (99.1 kg)    Nutrition Goal  For heart healthy choices add >50% of whole grains, make half their plate fruits and vegetables. Discuss the difference between starchy vegetables and leafy greens, and how leafy vegetables provide fiber, helps maintain healthy weight, helps control blood glucose, and lowers cholesterol.  Discuss purchasing fresh or frozen vegetable to reduce sodium and not to add grease, fat or sugar. Consume <18oz of red meat per week. Consume lean cuts of meats and very little of meats high in sodium and nitrates such as pork and lunch meats. Discussed portion control for all food groups.    Comment  Patient has lost 12.5 lbs since he started the program. He continues to follow a reduced calorie diet. Will continue to monitor for progress.     Expected Outcome  Patient will continue to meet his nutrtion goals at discharge.       Personal Goal #2 Re-Evaluation   Personal Goal #2  Eat heart healthy/ low na and diabetic, reduced calorie diet.        Psychosocial: Target Goals: Acknowledge presence or absence of significant depression and/or stress, maximize coping skills, provide positive support system. Participant is able to verbalize types and ability to use techniques and skills needed for reducing stress and depression.  Initial Review & Psychosocial Screening: Initial Psych Review & Screening - 06/22/17 1530      Initial Review   Current issues with  Current Depression;Current Anxiety/Panic      Family Dynamics   Good Support System?  Yes      Barriers   Psychosocial barriers to participate in program  There are no identifiable barriers or psychosocial needs.;The patient should benefit from training in stress management and relaxation.      Screening Interventions   Interventions  Encouraged to exercise;Provide feedback about the  scores to participant       Quality of Life Scores: Quality of Life - 06/22/17 1344      Quality of Life Scores   Health/Function Pre  9 %    Socioeconomic Pre  13 %  Psych/Spiritual Pre  17 %    Family Pre  16.8 %    GLOBAL Pre  12.64 %      Scores of 19 and below usually indicate a poorer quality of life in these areas.  A difference of  2-3 points is a clinically meaningful difference.  A difference of 2-3 points in the total score of the Quality of Life Index has been associated with significant improvement in overall quality of life, self-image, physical symptoms, and general health in studies assessing change in quality of life.  PHQ-9: Recent Review Flowsheet Data    Depression screen [facility] 2/9 09/18/2017 08/20/2017 08/17/2017 06/22/2017 06/17/2017   Decreased Interest 0 2 0 2 0   Down, Depressed, Hopeless 0 2 0 2 0   PHQ - 2 Score 0 4 0 4 0   Altered sleeping - 2 - 2 -   Tired, decreased energy - 1 - 2 -   Change in appetite - 2 - 3 -   Feeling bad or failure about yourself  - 0 - 3 -   Trouble concentrating - 0 - 1 -   Moving slowly or fidgety/restless - 0 - 0 -   Suicidal thoughts - 0 - 0 -   PHQ-9 Score - 9 - 15 -   Difficult doing work/chores - Very difficult - Extremely dIfficult -     Interpretation of Total Score  Total Score Depression Severity:  1-4 = Minimal depression, 5-9 = Mild depression, 10-14 = Moderate depression, 15-19 = Moderately severe depression, 20-27 = Severe depression   Psychosocial Evaluation and Intervention: Psychosocial Evaluation - 06/22/17 1531      Psychosocial Evaluation & Interventions   Interventions  Relaxation education;Encouraged to exercise with the program and follow exercise prescription    Continue Psychosocial Services   Follow up required by staff       Psychosocial Re-Evaluation: Psychosocial Re-Evaluation    Row Name 07/29/17 1455 08/19/17 1317 09/14/17 1337 10/08/17 1528       Psychosocial Re-Evaluation    Current issues with  Current Depression;Current Anxiety/Panic  Current Anxiety/Panic;Current Depression  Current Anxiety/Panic;Current Depression  Current Anxiety/Panic;Current Depression    Comments  Patient's initial QOL was 12.64 and his PHQ-9 was 15. He is seeing a Social worker and is being treated with trazadone and alprazolam.   Patient depression and anxiety continue to be managed with Trazadone and as needed Alprazolam.   Patient depression and anxiety continue to be managed with Trazadone and as needed Alprazolam.   Patient depression and anxiety continue to be managed with Trazadone and as needed Alprazolam.     Expected Outcomes  Patient's depression and anxiety will be managed at discharge with no additional psychosocial barriers identified.   Patient's depression and anxiety will continue to be managed at discharge with no other psychosocial issues identified.   Patient's depression and anxiety will continue to be managed at discharge with no other psychosocial issues identified.   Patient's depression and anxiety will continue to be managed at discharge with no other psychosocial issues identified.     Interventions  Stress management education;Encouraged to attend Cardiac Rehabilitation for the exercise  Stress management education;Encouraged to attend Cardiac Rehabilitation for the exercise;Relaxation education  Stress management education;Encouraged to attend Cardiac Rehabilitation for the exercise;Relaxation education  Stress management education;Encouraged to attend Cardiac Rehabilitation for the exercise;Relaxation education    Continue Psychosocial Services   Follow up required by staff  Follow up required by staff  Follow up  required by staff  Follow up required by staff       Psychosocial Discharge (Final Psychosocial Re-Evaluation): Psychosocial Re-Evaluation - 10/08/17 1528      Psychosocial Re-Evaluation   Current issues with  Current Anxiety/Panic;Current Depression    Comments   Patient depression and anxiety continue to be managed with Trazadone and as needed Alprazolam.     Expected Outcomes  Patient's depression and anxiety will continue to be managed at discharge with no other psychosocial issues identified.     Interventions  Stress management education;Encouraged to attend Cardiac Rehabilitation for the exercise;Relaxation education    Continue Psychosocial Services   Follow up required by staff       Vocational Rehabilitation: Provide vocational rehab assistance to qualifying candidates.   Vocational Rehab Evaluation & Intervention: Vocational Rehab - 06/22/17 1524      Initial Vocational Rehab Evaluation & Intervention   Assessment shows need for Vocational Rehabilitation  No       Education: Education Goals: Education classes will be provided on a weekly basis, covering required topics. Participant will state understanding/return demonstration of topics presented.  Learning Barriers/Preferences: Learning Barriers/Preferences - 06/22/17 1520      Learning Barriers/Preferences   Learning Barriers  None    Learning Preferences  Skilled Demonstration;Individual Instruction;Group Instruction;Video;Written Material;Pictoral       Education Topics: Hypertension, Hypertension Reduction -Define heart disease and high blood pressure. Discus how high blood pressure affects the body and ways to reduce high blood pressure.   CARDIAC REHAB PHASE II EXERCISE from 10/07/2017 in [place]  Date  07/08/17  Educator  [place]  Instruction Review Code  2- Demonstrated Understanding      Exercise and Your Heart -Discuss why it is important to exercise, the FITT principles of exercise, normal and abnormal responses to exercise, and how to exercise safely.   CARDIAC REHAB PHASE II EXERCISE from 10/07/2017 in [place]  Date  07/15/17  Educator  DC  Instruction Review Code  2- Demonstrated Understanding       Angina -Discuss definition of angina, causes of angina, treatment of angina, and how to decrease risk of having angina.   CARDIAC REHAB PHASE II EXERCISE from 10/07/2017 in [place]  Date  07/22/17  Educator  D. Coad  Instruction Review Code  2- Demonstrated Understanding      Cardiac Medications -Review what the following cardiac medications are used for, how they affect the body, and side effects that may occur when taking the medications.  Medications include Aspirin, Beta blockers, calcium channel blockers, ACE Inhibitors, angiotensin receptor blockers, diuretics, digoxin, and antihyperlipidemics.   CARDIAC REHAB PHASE II EXERCISE from 10/07/2017 in [place]  Date  07/29/17  Educator  DJ  Instruction Review Code  2- Demonstrated Understanding      Congestive Heart Failure -Discuss the definition of CHF, how to live with CHF, the signs and symptoms of CHF, and how keep track of weight and sodium intake.   CARDIAC REHAB PHASE II EXERCISE from 10/07/2017 in [place]  Date  08/05/17  Educator  DC  Instruction Review Code  2- Demonstrated Understanding      Heart Disease and Intimacy -Discus the effect sexual activity has on the heart, how changes occur during intimacy as we age, and safety during sexual activity.   Smoking Cessation / COPD -Discuss different methods to quit smoking, the health benefits of quitting smoking, and the definition of  COPD.   CARDIAC REHAB PHASE II EXERCISE from 10/07/2017 in [place]  Date  08/19/17  Educator  DC  Instruction Review Code  2- Demonstrated Understanding      Nutrition I: Fats -Discuss the types of cholesterol, what cholesterol does to the heart, and how cholesterol levels can be controlled.   Nutrition II: Labels -Discuss the different components of food labels and how to read food label   CARDIAC REHAB PHASE II EXERCISE from  10/07/2017 in [place]  Date  09/02/17  Educator  [place]  Instruction Review Code  2- Demonstrated Understanding      Heart Parts/Heart Disease and PAD -Discuss the anatomy of the heart, the pathway of blood circulation through the heart, and these are affected by heart disease.   CARDIAC REHAB PHASE II EXERCISE from 10/07/2017 in [place]  Date  09/09/17  Educator  DJ  Instruction Review Code  2- Demonstrated Understanding      Stress I: Signs and Symptoms -Discuss the causes of stress, how stress may lead to anxiety and depression, and ways to limit stress.   CARDIAC REHAB PHASE II EXERCISE from 10/07/2017 in [place]  Date  09/16/17  Educator  DJ  Instruction Review Code  2- Demonstrated Understanding      Stress II: Relaxation -Discuss different types of relaxation techniques to limit stress.   CARDIAC REHAB PHASE II EXERCISE from 10/07/2017 in [place]  Date  09/23/17  Educator  DC  Instruction Review Code  2- Demonstrated Understanding      Warning Signs of Stroke / TIA -Discuss definition of a stroke, what the signs and symptoms are of a stroke, and how to identify when someone is having stroke.   CARDIAC REHAB PHASE II EXERCISE from 10/07/2017 in [place]  Date  09/30/17  Educator  DC  Instruction Review Code  2- Demonstrated Understanding      Knowledge Questionnaire Score: Knowledge Questionnaire Score - 06/22/17 1523      Knowledge Questionnaire Score   Pre Score  23/24       Core Components/Risk Factors/Patient Goals at Admission: Personal Goals and Risk Factors at Admission - 06/22/17 1527      Core Components/Risk Factors/Patient Goals on Admission    Weight Management  Yes    Admit Weight  202 lb (91.6 kg)    Goal Weight: Short Term  177 lb (80.3 kg)    Goal Weight: Long Term  152 lb (68.9 kg)    Expected Outcomes  Short Term:  Continue to assess and modify interventions until short term weight is achieved;Long Term: Adherence to nutrition and physical activity/exercise program aimed toward attainment of established weight goal    Tobacco Cessation  Yes    Intervention  Assist the participant in steps to quit. Provide individualized education and counseling about committing to Tobacco Cessation, relapse prevention, and pharmacological support that can be provided by physician.    Expected Outcomes  Short Term: Will quit all tobacco product use, adhering to prevention of relapse plan.;Long Term: Complete abstinence from all tobacco products for at least 12 months from quit date.    Improve shortness of breath with ADL's  Yes    Intervention  Provide education, individualized exercise plan and daily activity instruction to help decrease symptoms of SOB with activities of daily living.    Expected Outcomes  Short Term: Achieves a reduction of symptoms when performing  activities of daily living.    Stress  Yes    Intervention  Offer individual and/or small group education and counseling on adjustment to heart disease, stress management and health-related lifestyle change. Teach and support self-help strategies.    Expected Outcomes  Short Term: Participant demonstrates changes in health-related behavior, relaxation and other stress management skills, ability to obtain effective social support, and compliance with psychotropic medications if prescribed.    Personal Goal Other  Yes    Personal Goal  Breathe better, Lose 50 lbs    Intervention  Attend program 3 x week and supplement 2 x week exercise at home    Expected Outcomes  Meet personal goals       Core Components/Risk Factors/Patient Goals Review:  Goals and Risk Factor Review    Row Name 07/29/17 1449 08/19/17 1309 09/14/17 1330 10/08/17 1522       Core Components/Risk Factors/Patient Goals Review   Personal Goals Review  Weight Management/Obesity;Diabetes;Tobacco  Cessation Breathe better; lose 50 lbs long term.   Weight Management/Obesity;Improve shortness of breath with ADL's;Diabetes;Tobacco Cessation Breathe better; lose 50 lbs long term.   Weight Management/Obesity;Improve shortness of breath with ADL's;Diabetes;Tobacco Cessation  Weight Management/Obesity;Improve shortness of breath with ADL's;Diabetes;Tobacco Cessation Breathe better; lose 50 lbs long term.    Review  Patient has completed 11 sessions gaining 1 lb. He is doing well in the program with progression. He continues to smoke but says he is trying to cut back. He is trying to alter his diet by cutting out soda's and cutting down on candy and sweets. His last A1C was 7.6 on 04/15/17. He says he is breathing better noting less SOB with activity. He says he feels stronger and is walking more at home. He has a stationary bike and hopes to work up to riding it. Will continue to monitor.   Patient has completed 17 sessions losing 4 lbs. Patient has reduced his smoking to 1/2 pk/day. He is not interested in tobacco cessation program but says he can quit on his own. His last A1C was 03/10/16 8.2. He states he has more energy and feels stronger. He is able to do more around the house now and is walking more and further. Will continue to monitor.   Patient has completed 24 sessions losing 8 lbs. Patient has stopped buying cigerattes and is using a nicotine patch. He says he has borrowed cigerattes from family and friends occasionally. His last A1C was 08/22/17 7.6 which was down from his last in July 2017 of 8.2. He continues to have more energy and feels stronger with less SOB. He is able to walk more and further. He is able to walk 2 blocks now without difficulty.  Will continue to monitor.   Patient has completed 34 sessions losing 12.5 lbs. He continues to smoke occasionally and is still using a nicotine patch. His reported glucose readings have been WNL with his last A1C 08/22/17 at 7.6. He was put on prednisone 10  mg for 10 days to treat chronic left hip and low back pain appx 2 weeks ago. He has completed the medication with improvement in his pain. He continues to progress in the program stating he feels a lot better overall and is stronger and less SOB. He contines to be able to walk 2 blocks without difficulty. He plans to join our forever fit maintenance program after he graduates. Will continue to monitor for progress.     Expected Outcomes  Patient will continue to  attend sessions and complete the program and continue to meet his personal goals.   Patient will complete the program and continue to meet his personal goals.   Patient will complete the program and continue to meet his personal goals.   Patient will complete the program and continue to meet his personal goals.        Core Components/Risk Factors/Patient Goals at Discharge (Final Review):  Goals and Risk Factor Review - 10/08/17 1522      Core Components/Risk Factors/Patient Goals Review   Personal Goals Review  Weight Management/Obesity;Improve shortness of breath with ADL's;Diabetes;Tobacco Cessation Breathe better; lose 50 lbs long term.    Review  Patient has completed 34 sessions losing 12.5 lbs. He continues to smoke occasionally and is still using a nicotine patch. His reported glucose readings have been WNL with his last A1C 08/22/17 at 7.6. He was put on prednisone 10 mg for 10 days to treat chronic left hip and low back pain appx 2 weeks ago. He has completed the medication with improvement in his pain. He continues to progress in the program stating he feels a lot better overall and is stronger and less SOB. He contines to be able to walk 2 blocks without difficulty. He plans to join our forever fit maintenance program after he graduates. Will continue to monitor for progress.     Expected Outcomes  Patient will complete the program and continue to meet his personal goals.        ITP Comments: ITP Comments    Row Name 06/29/17  0755 08/06/17 1519         ITP Comments  Patient new to program. He is currently being evaluated for a syncope episode. Plans to start 07/06/17. Will continue to monitor.   Pattient attended family matters with hospital chaplain to discuss how his recent diagnosis has changed his life.          Comments: ITP 30 Day REVIEW Patient doing well in the program. Will continue to monitor for progress.

## 2017-10-09 ENCOUNTER — Encounter (HOSPITAL_COMMUNITY)
Admission: RE | Admit: 2017-10-09 | Discharge: 2017-10-09 | Disposition: A | Discharge status: Home / Self Care | Payer: Medicaid Other | Source: Ambulatory Visit | Attending: Cardiology | Admitting: Cardiology

## 2017-10-09 VITALS — Ht 66.0 in | Wt 222.4 lb

## 2017-10-09 DIAGNOSIS — I214 Non-ST elevation (NSTEMI) myocardial infarction: Secondary | ICD-10-CM | POA: Diagnosis not present

## 2017-10-09 DIAGNOSIS — Z955 Presence of coronary angioplasty implant and graft: Secondary | ICD-10-CM

## 2017-10-09 NOTE — Progress Notes (Signed)
Daily Session Note  Patient Details  Name: Randy Hebert MRN: [id number] Date of Birth: [birth year] Referring Provider:     CARDIAC REHAB PHASE II ORIENTATION from 06/22/2017 in [place]  Referring Provider  Dr. [name]      Encounter Date: 10/09/2017  Check In: Session Check In - 10/09/17 0807      Check-In   Location  [facility]    Staff Present  Russella Dar, MS, EP, [facility], Exercise Physiologist;Debra Wynetta Emery, RN, BSN;Aleksa Collinsworth, BS, EP, Exercise Physiologist    Supervising physician immediately available to respond to emergencies  See telemetry face sheet for immediately available MD    Medication changes reported      No    Fall or balance concerns reported     No    Warm-up and Cool-down  Performed as group-led instruction    Resistance Training Performed  Yes    VAD Patient?  No      Pain Assessment   Currently in Pain?  No/denies    Pain Score  0-No pain    Multiple Pain Sites  No       Capillary Blood Glucose: No results found for this or any previous visit (from the past 24 hour(s)).  Exercise Prescription Changes - 10/08/17 1500      Response to Exercise   Blood Pressure (Admit)  144/70    Blood Pressure (Exercise)  150/64    Blood Pressure (Exit)  140/70    Heart Rate (Admit)  86 bpm    Heart Rate (Exercise)  103 bpm    Heart Rate (Exit)  95 bpm    Rating of Perceived Exertion (Exercise)  12    Duration  Progress to 30 minutes of  aerobic without signs/symptoms of physical distress    Intensity  THRR New [phone]      Progression   Progression  Continue to progress workloads to maintain intensity without signs/symptoms of physical distress.      Resistance Training   Training Prescription  Yes    Weight  4    Reps  10-15      Treadmill   MPH  1.6    Grade  0    Minutes  15    METs  2.2      NuStep   Level  3    SPM  97    Minutes  20    METs  3.2      Home Exercise Plan   Plans to continue  exercise at  Home (comment)    Frequency  Add 2 additional days to program exercise sessions.    Initial Home Exercises Provided  07/08/17       Social History   Tobacco Use  Smoking Status Current Every Day Smoker  . Packs/day: 1.00  . Years: 46.00  . Pack years: 46.00  . Types: Cigarettes  . Start date: 06/07/1979  Smokeless Tobacco Former Systems developer  . Types: Chew    Goals Met:  Independence with exercise equipment Exercise tolerated well No report of cardiac concerns or symptoms Strength training completed today  Goals Unmet:  Not Applicable  Comments: Check out 915   Dr. Kate Sable is Medical Director for [facility] and [facility].

## 2017-10-12 ENCOUNTER — Encounter (HOSPITAL_COMMUNITY)
Admission: RE | Admit: 2017-10-12 | Discharge: 2017-10-12 | Disposition: A | Discharge status: Home / Self Care | Payer: Medicaid Other | Source: Ambulatory Visit | Attending: Cardiology | Admitting: Cardiology

## 2017-10-12 DIAGNOSIS — I214 Non-ST elevation (NSTEMI) myocardial infarction: Secondary | ICD-10-CM | POA: Diagnosis not present

## 2017-10-12 DIAGNOSIS — Z955 Presence of coronary angioplasty implant and graft: Secondary | ICD-10-CM

## 2017-10-12 NOTE — Progress Notes (Signed)
Daily Session Note  Patient Details  Name: Randy Hebert MRN: [id number] Date of Birth: 10/15/1965 Referring Provider:     CARDIAC REHAB PHASE II ORIENTATION from 06/22/2017 in [place]  Referring Provider  Dr. [name]      Encounter Date: 10/12/2017  Check In: Session Check In - 10/12/17 0815      Check-In   Location  [facility]    Staff Present  Russella Dar, MS, EP, [facility], Exercise Physiologist;Timouthy Gilardi Wynetta Emery, RN, BSN;Gregory Cowan, BS, EP, Exercise Physiologist    Supervising physician immediately available to respond to emergencies  See telemetry face sheet for immediately available MD    Medication changes reported      No    Fall or balance concerns reported     Yes    Comments  Fallen 3 times    Tobacco Cessation  No Change    Warm-up and Cool-down  Performed as group-led instruction    Resistance Training Performed  Yes    VAD Patient?  No      Pain Assessment   Currently in Pain?  No/denies    Pain Score  0-No pain    Multiple Pain Sites  No       Capillary Blood Glucose: No results found for this or any previous visit (from the past 24 hour(s)).    Social History   Tobacco Use  Smoking Status Current Every Day Smoker  . Packs/day: 1.00  . Years: 46.00  . Pack years: 46.00  . Types: Cigarettes  . Start date: 06/07/1979  Smokeless Tobacco Former Systems developer  . Types: Chew    Goals Met:  Independence with exercise equipment Exercise tolerated well No report of cardiac concerns or symptoms Strength training completed today  Goals Unmet:  Not Applicable  Comments: Check out 915.   Dr. Kate Sable is Medical Director for [facility].

## 2017-10-14 ENCOUNTER — Encounter (HOSPITAL_COMMUNITY): Payer: Medicaid Other

## 2017-10-15 ENCOUNTER — Other Ambulatory Visit: Payer: Self-pay

## 2017-10-15 ENCOUNTER — Encounter: Payer: Self-pay | Admitting: Family Medicine

## 2017-10-15 ENCOUNTER — Ambulatory Visit: Payer: Medicaid Other | Admitting: Family Medicine

## 2017-10-15 VITALS — BP 130/60 | HR 60 | Temp 98.2°F | Resp 16 | Wt 220.0 lb

## 2017-10-15 DIAGNOSIS — F17200 Nicotine dependence, unspecified, uncomplicated: Secondary | ICD-10-CM

## 2017-10-15 DIAGNOSIS — I781 Nevus, non-neoplastic: Secondary | ICD-10-CM

## 2017-10-15 NOTE — Progress Notes (Signed)
Chief Complaint  Patient presents with  . Bleeding/Bruising    c/o bruising on back last few months, no known injury, tenderness 5/10 pain   Patient has for an acute visit today because of discoloration of his low back.  His girlfriend has noticed that has been there for months.  He has not any trauma or injury.  He has not had a hurt his back.  He does have chronic low back pain from degenerative disc disease.  He wonders if it is related.  No other bruising or bleeding problems. He states that he still smoking a small amount.  He has a plan to quit on his [birth year] which is October 27, 2011 days from now.  I told him to keep setting goals and trying to quit and eventually he will be successful.  Try to encourage him today that he has the ability to quit but he really needs to keep working on it.  Patient Active Problem List   Diagnosis Date Noted  . DM type 2 causing vascular disease ([place]) 08/17/2017  . Noncompliance 07/10/2017  . Syncope 06/29/2017  . HCAP (healthcare-associated pneumonia) 05/15/2017  . Uncontrolled type 2 diabetes mellitus with hyperglycemia, without long-term current use of insulin ([place]) 05/15/2017  . Acute on chronic respiratory failure with hypoxia ([place]) 05/15/2017  . Lobar pneumonia ([place]) 05/15/2017  . Chronic combined systolic and diastolic CHF (congestive heart failure) ([place]) 05/15/2017  . Sinus pause 05/15/2017  . Syncope and collapse 05/15/2017  . Bradycardia 04/28/2017  . Sleep apnea 04/28/2017  . Non-ST elevation (NSTEMI) myocardial infarction ([place]) 04/27/2017  . NSTEMI (non-ST elevated myocardial infarction) ([place]) 04/26/2017  . Substance abuse ([place]) 04/15/2017  . Depression with anxiety 04/15/2017  . GERD (gastroesophageal reflux disease) 09/23/2016  . 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 Chronic obstructive pulmonary disease ([place]) 08/27/2015  . Cigarette nicotine dependence with nicotine-induced disorder 08/27/2015  .  Hyperlipidemia 08/27/2015  . Class 2 severe obesity due to excess calories with serious comorbidity and body mass index (BMI) of 36.0 to 36.9 in adult [facility]) 08/27/2015  . Acute on chronic systolic heart failure ([place]) 06/02/2015  . ACE inhibitor-aggravated angioedema   . Chronic systolic CHF (congestive heart failure) ([place]) 06/01/2015  . CAD (coronary artery disease) 05/31/2015  . STEMI (ST elevation myocardial infarction) ([place]) 05/31/2015  . Tobacco use disorder 05/31/2015  . Essential hypertension 02/09/2015    Outpatient Encounter Medications as of 10/15/2017  Medication Sig  . acetaminophen (TYLENOL) 325 MG tablet Take 650 mg by mouth every 6 (six) hours as needed for mild pain.  Marland Kitchen albuterol (PROVENTIL) (2.5 MG/3ML) 0.083% nebulizer solution Take 3 mLs (2.5 mg total) by nebulization every 6 (six) hours as needed for wheezing or shortness of breath.  Marland Kitchen aspirin 81 MG EC tablet Take 1 tablet (81 mg total) by mouth daily.  Marland Kitchen atorvastatin (LIPITOR) 80 MG tablet Take 80 mg by mouth daily.  . Carboxymeth-Glycerin-Polysorb (REFRESH OPTIVE ADVANCED) 0.5-1-0.5 % SOLN Apply 1 drop to eye 2 (two) times daily as needed (dry eyes).   . citalopram (CELEXA) 20 MG tablet Take 20 mg daily by mouth.  . cycloSPORINE (RESTASIS) 0.05 % ophthalmic emulsion Place 1 drop into both eyes 2 (two) times daily.  . fluticasone (FLONASE) 50 MCG/ACT nasal spray Place 2 sprays into both nostrils daily.  . furosemide (LASIX) 40 MG tablet Take 1 tablet (40 mg total) by mouth daily.  Marland Kitchen glucose blood (IGLUCOSE TEST STRIPS) test strip 1 each  by Other route 2 (two) times daily. Use as instructed  . Magnesium 200 MG TABS Take 1 tablet (200 mg total) by mouth daily.  . metFORMIN (GLUCOPHAGE) 1000 MG tablet Take 1 tablet (1,000 mg total) by mouth 2 (two) times daily with a meal.  . mometasone-formoterol (DULERA) 100-5 MCG/ACT AERO Inhale 2 puffs into the lungs 2 (two) times daily.  . nicotine (NICODERM CQ - DOSED IN MG/24 HOURS)  21 mg/24hr patch Place 1 patch (21 mg total) onto the skin daily. (Patient taking differently: Place 21 mg onto the skin daily. )  . nitroGLYCERIN (NITROSTAT) 0.4 MG SL tablet Place 1 tablet (0.4 mg total) under the tongue every 5 (five) minutes as needed for chest pain.  . Omega-3 Fatty Acids (FISH OIL) 1200 MG CAPS Take 1,200 mg by mouth 2 (two) times daily.  . pantoprazole (PROTONIX) 40 MG tablet Take 1 tablet (40 mg total) by mouth daily.  . potassium chloride SA (K-DUR,KLOR-CON) 20 MEQ tablet Take 1 tablet (20 mEq total) by mouth daily.  . prasugrel (EFFIENT) 10 MG TABS tablet TAKE 1 TABLET BY MOUTH ONCE A DAY.  Marland Kitchen predniSONE (DELTASONE) 10 MG tablet Take 1 tablet (10 mg total) by mouth daily with breakfast.  . PROVENTIL HFA 108 (90 Base) MCG/ACT inhaler INHALE 2 PUFFS INTO THE LUNGS EVERY 6 HOURS AS NEEDED FOR SHORTNESS OF BREATH OR WHEEZING.  . traZODone (DESYREL) 100 MG tablet Take 100 mg by mouth at bedtime.    No facility-administered encounter medications on file as of 10/15/2017.     Allergies  Allergen Reactions  . Carvedilol Other (See Comments)    Sinus pause on telemetry >3 seconds. Longest one 9 sec. No AV nodal agent  . Lisinopril Anaphylaxis, Shortness Of Breath and Swelling    Angioedema, required intubation and mechanical ventilation  . Amoxicillin Nausea And Vomiting    Nausea only - no allergy    Review of Systems  Respiratory: Positive for shortness of breath.        Patient reports that since attending cardiac rehab his shortness of breath is improved and his endurance is better.  He is going to sign up for additional exercise program to continue this success  Musculoskeletal: Positive for back pain.       Chronic back and hip pain  Skin: Positive for color change.  All other systems reviewed and are negative.    BP 130/60 (BP Location: Left Arm, Patient Position: Sitting, Cuff Size: Normal)   Pulse 60   Temp 98.2 F (36.8 C) (Temporal)   Resp 16   Wt 220  lb (99.8 kg)   SpO2 95%   BMI 35.51 kg/m   Physical Exam  Constitutional: He appears well-developed and well-nourished. No distress.  HENT:  Head: Normocephalic and atraumatic.  Mouth/Throat: Oropharynx is clear and moist.  Eyes: Conjunctivae are normal.  Neck: Normal range of motion.  Cardiovascular: Normal rate, regular rhythm and normal heart sounds.  Pulmonary/Chest: Effort normal.  Mild tachypnea.  Breath sounds clear  Abdominal: Soft. Bowel sounds are normal.  Abdominal obesity  Musculoskeletal:       Back:    ASSESSMENT/PLAN:  1. Asymptomatic spider vein Discussed with patient that this is going to be a permanent finding.  Is absolutely harmless.   Patient Instructions  No concern for the dark spots on your back  Keep trying to quit smoking   Raylene Everts, MD

## 2017-10-15 NOTE — Patient Instructions (Signed)
No concern for the dark spots on your back  Keep trying to quit smoking

## 2017-10-16 NOTE — Progress Notes (Signed)
Discharge Progress Report  Patient Details  Name: Randy Hebert MRN: [id number] Date of Birth: August 29, 1966 Referring Provider:     [place] from 06/22/2017 in [place]  Referring Provider  Dr. [name]       Number of Visits: 36  Reason for Discharge:  Patient reached a stable level of exercise. Patient independent in their exercise. Patient has met program and personal goals.  Smoking History:  Social History   Tobacco Use  Smoking Status Current Every Day Smoker  . Packs/day: 1.00  . Years: 46.00  . Pack years: 46.00  . Types: Cigarettes  . Start date: 06/07/1979  Smokeless Tobacco Former Systems developer  . Types: Chew    Diagnosis:  NSTEMI (non-ST elevated myocardial infarction) ([place])  Status post coronary artery stent placement  ADL [facility]:   Initial Exercise Prescription: Initial Exercise Prescription - 06/22/17 1300      Date of Initial Exercise RX and Referring Provider   Date  06/22/17    Referring Provider  Dr. [name]      NuStep   Level  2    SPM  50    Minutes  20    METs  1.7      Arm Ergometer   Level  1.5    Watts  12    RPM  12    Minutes  15    METs  1.9      Prescription Details   Frequency (times per week)  3    Duration  Progress to 30 minutes of continuous aerobic without signs/symptoms of physical distress      Intensity   THRR 40-80% of Max Heartrate  113-132-150    Ratings of Perceived Exertion  11-13    Perceived Dyspnea  0-4      Progression   Progression  Continue progressive overload as per policy without signs/symptoms or physical distress.      Resistance Training   Training Prescription  Yes    Weight  1    Reps  10-15       Discharge Exercise Prescription (Final Exercise Prescription Changes): Exercise Prescription Changes - 10/08/17 1500      Response to Exercise   Blood Pressure (Admit)  144/70    Blood Pressure (Exercise)  150/64    Blood Pressure (Exit)  140/70     Heart Rate (Admit)  86 bpm    Heart Rate (Exercise)  103 bpm    Heart Rate (Exit)  95 bpm    Rating of Perceived Exertion (Exercise)  12    Duration  Progress to 30 minutes of  aerobic without signs/symptoms of physical distress    Intensity  THRR New [phone]      Progression   Progression  Continue to progress workloads to maintain intensity without signs/symptoms of physical distress.      Resistance Training   Training Prescription  Yes    Weight  4    Reps  10-15      Treadmill   MPH  1.6    Grade  0    Minutes  15    METs  2.2      NuStep   Level  3    SPM  97    Minutes  20    METs  3.2      Home Exercise Plan   Plans to continue exercise at  Home (comment)    Frequency  Add 2 additional days  to program exercise sessions.    Initial Home Exercises Provided  07/08/17       Functional Capacity: 6 Minute Walk    Row Name 06/22/17 1340 10/15/17 0829       6 Minute Walk   Phase  Initial  -    Distance  1000 feet  1600 feet    Distance % Change  0 %  60 %    Distance Feet Change  0 ft  600 ft    Walk Time  6 minutes  6 minutes    # of Rest Breaks  0  0    MPH  1.89  3.03    METS  1.59  3.32    RPE  10  15    Perceived Dyspnea   13  13    VO2 Peak  11.3  16.8    Symptoms  No  No    Resting HR  77 bpm  101 bpm    Resting BP  124/88  148/88    Resting Oxygen Saturation   95 %  93 %    Exercise Oxygen Saturation  during 6 min walk  91 %  93 %    Max Ex. HR  103 bpm  122 bpm    Max Ex. BP  184/100  206/94    2 Minute Post BP  126/84  150/84       Psychological, QOL, Others - Outcomes: PHQ 2/9: Depression screen [facility] 2/9 10/16/2017 09/18/2017 08/20/2017 08/17/2017 06/22/2017  Decreased Interest 0 0 2 0 2  Down, Depressed, Hopeless 2 0 2 0 2  PHQ - 2 Score 2 0 4 0 4  Altered sleeping 1 - 2 - 2  Tired, decreased energy 1 - 1 - 2  Change in appetite 0 - 2 - 3  Feeling bad or failure about yourself  3 - 0 - 3  Trouble concentrating 1 - 0 - 1  Moving  slowly or fidgety/restless 1 - 0 - 0  Suicidal thoughts 0 - 0 - 0  PHQ-9 Score 9 - 9 - 15  Difficult doing work/chores Very difficult - Very difficult - Extremely dIfficult    Quality of Life: Quality of Life - 10/15/17 0831      Quality of Life Scores   Health/Function Pre  9 %    Health/Function Post  14 %    Health/Function % Change  55.56 %    Socioeconomic Pre  13 %    Socioeconomic Post  19.71 %    Socioeconomic % Change   51.62 %    Psych/Spiritual Pre  17.07 %    Psych/Spiritual Post  17.71 %    Psych/Spiritual % Change  3.75 %    Family Pre  16.8 %    Family Post  27.6 %    Family % Change  64.29 %    GLOBAL Pre  12.64 %    GLOBAL Post  17.94 %    GLOBAL % Change  41.93 %       Personal Goals: Goals established at orientation with interventions provided to work toward goal. Personal Goals and Risk Factors at Admission - 06/22/17 1527      Core Components/Risk Factors/Patient Goals on Admission    Weight Management  Yes    Admit Weight  202 lb (91.6 kg)    Goal Weight: Short Term  177 lb (80.3 kg)    Goal Weight: Long Term  152 lb (68.9  kg)    Expected Outcomes  Short Term: Continue to assess and modify interventions until short term weight is achieved;Long Term: Adherence to nutrition and physical activity/exercise program aimed toward attainment of established weight goal    Tobacco Cessation  Yes    Intervention  Assist the participant in steps to quit. Provide individualized education and counseling about committing to Tobacco Cessation, relapse prevention, and pharmacological support that can be provided by physician.    Expected Outcomes  Short Term: Will quit all tobacco product use, adhering to prevention of relapse plan.;Long Term: Complete abstinence from all tobacco products for at least 12 months from quit date.    Improve shortness of breath with ADL's  Yes    Intervention  Provide education, individualized exercise plan and daily activity instruction to  help decrease symptoms of SOB with activities of daily living.    Expected Outcomes  Short Term: Achieves a reduction of symptoms when performing activities of daily living.    Stress  Yes    Intervention  Offer individual and/or small group education and counseling on adjustment to heart disease, stress management and health-related lifestyle change. Teach and support self-help strategies.    Expected Outcomes  Short Term: Participant demonstrates changes in health-related behavior, relaxation and other stress management skills, ability to obtain effective social support, and compliance with psychotropic medications if prescribed.    Personal Goal Other  Yes    Personal Goal  Breathe better, Lose 50 lbs    Intervention  Attend program 3 x week and supplement 2 x week exercise at home    Expected Outcomes  Meet personal goals        Personal Goals Discharge: Goals and Risk Factor Review    Row Name 07/29/17 1449 08/19/17 1309 09/14/17 1330 10/08/17 1522 10/16/17 1426     Core Components/Risk Factors/Patient Goals Review   Personal Goals Review  Weight Management/Obesity;Diabetes;Tobacco Cessation Breathe better; lose 50 lbs long term.   Weight Management/Obesity;Improve shortness of breath with ADL's;Diabetes;Tobacco Cessation Breathe better; lose 50 lbs long term.   Weight Management/Obesity;Improve shortness of breath with ADL's;Diabetes;Tobacco Cessation  Weight Management/Obesity;Improve shortness of breath with ADL's;Diabetes;Tobacco Cessation Breathe better; lose 50 lbs long term.  Weight Management/Obesity;Improve shortness of breath with ADL's;Diabetes;Tobacco Cessation Breathe better; lose 50 lbs.   Review  Patient has completed 11 sessions gaining 1 lb. He is doing well in the program with progression. He continues to smoke but says he is trying to cut back. He is trying to alter his diet by cutting out soda's and cutting down on candy and sweets. His last A1C was 7.6 on 04/15/17. He says  he is breathing better noting less SOB with activity. He says he feels stronger and is walking more at home. He has a stationary bike and hopes to work up to riding it. Will continue to monitor.   Patient has completed 17 sessions losing 4 lbs. Patient has reduced his smoking to 1/2 pk/day. He is not interested in tobacco cessation program but says he can quit on his own. His last A1C was 03/10/16 8.2. He states he has more energy and feels stronger. He is able to do more around the house now and is walking more and further. Will continue to monitor.   Patient has completed 24 sessions losing 8 lbs. Patient has stopped buying cigerattes and is using a nicotine patch. He says he has borrowed cigerattes from family and friends occasionally. His last A1C was 08/22/17 7.6 which was  down from his last in July 2017 of 8.2. He continues to have more energy and feels stronger with less SOB. He is able to walk more and further. He is able to walk 2 blocks now without difficulty.  Will continue to monitor.   Patient has completed 34 sessions losing 12.5 lbs. He continues to smoke occasionally and is still using a nicotine patch. His reported glucose readings have been WNL with his last A1C 08/22/17 at 7.6. He was put on prednisone 10 mg for 10 days to treat chronic left hip and low back pain appx 2 weeks ago. He has completed the medication with improvement in his pain. He continues to progress in the program stating he feels a lot better overall and is stronger and less SOB. He contines to be able to walk 2 blocks without difficulty. He plans to join our [facility] maintenance program after he graduates. Will continue to monitor for progress.   Patient graduated with 36 sessions losing 12 lbs overall. He did great in the program. He continues to smoke but has decreased his usage and is using a nicotine patch. He reported fasting glucose reading are WNL. His exit measurements improved in balance and his exit walk test  improved by 60%. He says he feel a lot better since he started and is able to walk further than he has in a long time. He says he feels stronger and less SOB. He plans to join our [employer] program to continue exercising.    Expected Outcomes  Patient will continue to attend sessions and complete the program and continue to meet his personal goals.   Patient will complete the program and continue to meet his personal goals.   Patient will complete the program and continue to meet his personal goals.   Patient will complete the program and continue to meet his personal goals.   Patient will continue exercising in our [place] Maintenance program and continue to meet his personal goals. CR will f/u for one year.       Exercise Goals and Review: Exercise Goals    Row Name 06/22/17 1342             Exercise Goals   Increase Physical Activity  Yes       Intervention  Provide advice, education, support and counseling about physical activity/exercise needs.;Develop an individualized exercise prescription for aerobic and resistive training based on initial evaluation findings, risk stratification, comorbidities and participant's personal goals.       Expected Outcomes  Achievement of increased cardiorespiratory fitness and enhanced flexibility, muscular endurance and strength shown through measurements of functional capacity and personal statement of participant.       Increase Strength and Stamina  Yes       Intervention  Provide advice, education, support and counseling about physical activity/exercise needs.;Develop an individualized exercise prescription for aerobic and resistive training based on initial evaluation findings, risk stratification, comorbidities and participant's personal goals.       Expected Outcomes  Achievement of increased cardiorespiratory fitness and enhanced flexibility, muscular endurance and strength shown through measurements of functional capacity and  personal statement of participant.       Able to understand and use rate of perceived exertion (RPE) scale  Yes       Intervention  Provide education and explanation on how to use RPE scale       Expected Outcomes  Short Term: Able to use RPE daily in rehab to  express subjective intensity level;Long Term:  Able to use RPE to guide intensity level when exercising independently       Able to understand and use Dyspnea scale  Yes       Intervention  Provide education and explanation on how to use Dyspnea scale       Expected Outcomes  Short Term: Able to use Dyspnea scale daily in rehab to express subjective sense of shortness of breath during exertion;Long Term: Able to use Dyspnea scale to guide intensity level when exercising independently       Knowledge and understanding of Target Heart Rate Range (THRR)  Yes       Intervention  Provide education and explanation of THRR including how the numbers were predicted and where they are located for reference       Expected Outcomes  Short Term: Able to state/look up THRR       Able to check pulse independently  Yes       Intervention  Provide education and demonstration on how to check pulse in carotid and radial arteries.;Review the importance of being able to check your own pulse for safety during independent exercise       Expected Outcomes  Short Term: Able to explain why pulse checking is important during independent exercise;Long Term: Able to check pulse independently and accurately       Understanding of Exercise Prescription  Yes       Intervention  Provide education, explanation, and written materials on patient's individual exercise prescription       Expected Outcomes  Short Term: Able to explain program exercise prescription;Long Term: Able to explain home exercise prescription to exercise independently          Nutrition & Weight - Outcomes: Pre Biometrics - 06/22/17 1343      Pre Biometrics   Height  _0  (1.676 m)    Weight  230 lb  9.6 oz (104.6 kg)    Waist Circumference  50 inches    Hip Circumference  43 inches    Waist to Hip Ratio  1.16 %    BMI (Calculated)  37.24    Triceps Skinfold  6 mm    % Body Fat  32 %    Grip Strength  62.33 kg    Flexibility  0 in    Single Leg Stand  15 seconds      Post Biometrics - 10/15/17 0830       Post  Biometrics   Height  _1  (1.676 m)    Weight  222 lb 7.1 oz (100.9 kg)    Waist Circumference  48 inches    Hip Circumference  41.5 inches    Waist to Hip Ratio  1.16 %    BMI (Calculated)  35.92    Triceps Skinfold  6 mm    % Body Fat  30.5 %    Grip Strength  77.2 kg    Flexibility  0 in    Single Leg Stand  33 seconds       Nutrition: Nutrition Therapy & Goals - 08/06/17 1518      Personal Nutrition Goals   Nutrition Goal  For heart healthy choices add >50% of whole grains, make half their plate fruits and vegetables. Discuss the difference between starchy vegetables and leafy greens, and how leafy vegetables provide fiber, helps maintain healthy weight, helps control blood glucose, and lowers cholesterol.  Discuss purchasing fresh or frozen vegetable to reduce  sodium and not to add grease, fat or sugar. Consume <18oz of red meat per week. Consume lean cuts of meats and very little of meats high in sodium and nitrates such as pork and lunch meats. Discussed portion control for all food groups.      Personal Goal #2  Continues to eat heart healthy low NA diabetic diet.     Additional Goals?  No    Comments  Patient attended nutrition class.       Intervention Plan   Intervention  Prescribe, educate and counsel regarding individualized specific dietary modifications aiming towards targeted core components such as weight, hypertension, lipid management, diabetes, heart failure and other comorbidities.;Nutrition handout(s) given to patient.    Expected Outcomes  Short Term Goal: Understand basic principles of dietary content, such as calories, fat, sodium,  cholesterol and nutrients.       Nutrition Discharge: Nutrition Assessments - 10/16/17 1424      MEDFICTS Scores   Pre Score  64    Post Score  52    Score Difference  -12       Education Questionnaire Score: Knowledge Questionnaire Score - 10/16/17 1424      Knowledge Questionnaire Score   Pre Score  23/24    Post Score  26/28       Goals reviewed with patient; copy given to patient.

## 2017-10-16 NOTE — Progress Notes (Signed)
Cardiac Individual Treatment Plan  Patient Details  Name: KADARRIUS YANKE MRN: [id number] Date of Birth: 1966/08/06 Referring Provider:     CARDIAC REHAB [place] from 06/22/2017 in [place]  Referring Provider  Dr. [name]      Initial Encounter Date:    CARDIAC REHAB PHASE II ORIENTATION from 06/22/2017 in [place]  Date  06/22/17  Referring Provider  Dr. [name]      Visit Diagnosis: NSTEMI (non-ST elevated myocardial infarction) [facility])  Status post coronary artery stent placement  Patient's Home Medications on Admission:  Current Outpatient Medications:  .  acetaminophen (TYLENOL) 325 MG tablet, Take 650 mg by mouth every 6 (six) hours as needed for mild pain., Disp: , Rfl:  .  albuterol (PROVENTIL) (2.5 MG/3ML) 0.083% nebulizer solution, Take 3 mLs (2.5 mg total) by nebulization every 6 (six) hours as needed for wheezing or shortness of breath., Disp: 150 mL, Rfl: 1 .  aspirin 81 MG EC tablet, Take 1 tablet (81 mg total) by mouth daily., Disp: 30 tablet, Rfl:  .  atorvastatin (LIPITOR) 80 MG tablet, Take 80 mg by mouth daily., Disp: , Rfl:  .  Carboxymeth-Glycerin-Polysorb (REFRESH OPTIVE ADVANCED) 0.5-1-0.5 % SOLN, Apply 1 drop to eye 2 (two) times daily as needed (dry eyes). , Disp: , Rfl:  .  citalopram (CELEXA) 20 MG tablet, Take 20 mg daily by mouth., Disp: , Rfl:  .  cycloSPORINE (RESTASIS) 0.05 % ophthalmic emulsion, Place 1 drop into both eyes 2 (two) times daily., Disp: , Rfl:  .  fluticasone (FLONASE) 50 MCG/ACT nasal spray, Place 2 sprays into both nostrils daily., Disp: 16 g, Rfl: 6 .  furosemide (LASIX) 40 MG tablet, Take 1 tablet (40 mg total) by mouth daily., Disp: 90 tablet, Rfl: 3 .  glucose blood (IGLUCOSE TEST STRIPS) test strip, 1 each by Other route 2 (two) times daily. Use as instructed, Disp: 100 each, Rfl: 12 .  Magnesium 200 MG TABS, Take 1 tablet (200 mg total) by mouth daily., Disp: 30 each,  Rfl: 6 .  metFORMIN (GLUCOPHAGE) 1000 MG tablet, Take 1 tablet (1,000 mg total) by mouth 2 (two) times daily with a meal., Disp: 180 tablet, Rfl: 3 .  mometasone-formoterol (DULERA) 100-5 MCG/ACT AERO, Inhale 2 puffs into the lungs 2 (two) times daily., Disp: 3 Inhaler, Rfl: 3 .  nicotine (NICODERM CQ - DOSED IN MG/24 HOURS) 21 mg/24hr patch, Place 1 patch (21 mg total) onto the skin daily. (Patient taking differently: Place 21 mg onto the skin daily. ), Disp: 28 patch, Rfl: 0 .  nitroGLYCERIN (NITROSTAT) 0.4 MG SL tablet, Place 1 tablet (0.4 mg total) under the tongue every 5 (five) minutes as needed for chest pain., Disp: 25 tablet, Rfl: 3 .  Omega-3 Fatty Acids (FISH OIL) 1200 MG CAPS, Take 1,200 mg by mouth 2 (two) times daily., Disp: , Rfl:  .  pantoprazole (PROTONIX) 40 MG tablet, Take 1 tablet (40 mg total) by mouth daily., Disp: 30 tablet, Rfl: 11 .  potassium chloride SA (K-DUR,KLOR-CON) 20 MEQ tablet, Take 1 tablet (20 mEq total) by mouth daily., Disp: 30 tablet, Rfl: 3 .  prasugrel (EFFIENT) 10 MG TABS tablet, TAKE 1 TABLET BY MOUTH ONCE A DAY., Disp: 30 tablet, Rfl: 10 .  predniSONE (DELTASONE) 10 MG tablet, Take 1 tablet (10 mg total) by mouth daily with breakfast., Disp: 7 tablet, Rfl: 0 .  PROVENTIL HFA 108 (90 Base) MCG/ACT inhaler, INHALE 2 PUFFS INTO  THE LUNGS EVERY 6 HOURS AS NEEDED FOR SHORTNESS OF BREATH OR WHEEZING., Disp: 18 g, Rfl: 1 .  traZODone (DESYREL) 100 MG tablet, Take 100 mg by mouth at bedtime. , Disp: , Rfl:   Past Medical History: Past Medical History:  Diagnosis Date  . Allergy   . Anxiety   . Arthritis   . Asthma    hip replacement  . Back pain   . Bulging of cervical intervertebral disc   . CAD (coronary artery disease)    lateral STEMI 02/06/2015 00% D1 occlusion treated with Promus Premier 2.5 mm x 16 mm DES, 70% ramus stenosis, 40% mid RCA stenosis, 45% distal RCA stenosis, EF 45-50%  . CHF (congestive heart failure) ([place])   . COPD (chronic obstructive  pulmonary disease) ([place])   . Depression   . Diabetes mellitus without complication ([place])   . 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 Dry eye   . Dyspnea   . Early satiety 09/23/2016  . GERD (gastroesophageal reflux disease)   . Headache   . Heart murmur   . Hip pain   . Hyperlipidemia   . Hypertension   . MI (myocardial infarction) ([place])   . Myocardial infarction ([place])   . Neck pain   . Otitis media   . Pleurisy   . Sinus pause    9 sec sinus pause on telemetry after started on coreg after MI, avoid AV nodal blocking agent  . Sleep apnea   . Substance abuse ([place]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  . Unilateral primary osteoarthritis, right hip 07/01/2016    Tobacco Use: Social History   Tobacco Use  Smoking Status Current Every Day Smoker  . Packs/day: 1.00  . Years: 46.00  . Pack years: 46.00  . Types: Cigarettes  . Start date: 06/07/1979  Smokeless Tobacco Former Systems developer  . Types: Chew    Labs: Recent Review Flowsheet Data    Labs for ITP Cardiac and Pulmonary Rehab Latest Ref Rng & Units 05/20/2016 08/05/2016 11/11/2016 04/15/2017 04/27/2017   Cholestrol 0 - 200 mg/dL 128 132 112 134 93   LDLCALC 0 - 99 mg/dL 60 56 49 71 34   HDL >40 mg/dL 24(L) 22(L) 20(L) 24(L) 18(L)   Trlycerides <150 mg/dL 222(H) 272(H) 214(H) 193(H) 206(H)   Hemoglobin A1c <5.7 % 8.0(H) 6.3(H) 6.8(H) 7.6(H) -   PHART 7.350 - 7.450 - - - - -   PCO2ART 35.0 - 45.0 mmHg - - - - -   HCO3 20.0 - 24.0 mEq/L - - - - -   TCO2 0 - 100 mmol/L - - - - -   O2SAT % - - - - -      Capillary Blood Glucose: Lab Results  Component Value Date   GLUCAP 138 (H) 06/30/2017   GLUCAP 274 (H) 06/29/2017   GLUCAP 135 (H) 06/29/2017   GLUCAP 147 (H) 06/29/2017   GLUCAP 139 (H) 06/29/2017     Exercise Target Goals:    Exercise Program Goal: Individual exercise prescription set using results from initial 6 min walk test and THRR while considering  patient's activity barriers and safety.    Exercise Prescription Goal: Starting with aerobic activity 30 plus minutes a day, 3 days per week for initial exercise prescription. Provide home exercise prescription and guidelines that participant acknowledges understanding prior to discharge.  Activity Barriers & Risk Stratification: Activity Barriers & Cardiac Risk Stratification - 06/22/17 1340  Activity Barriers & Cardiac Risk Stratification   Activity Barriers  Arthritis;Back Problems;Right Hip Replacement;Joint Problems;Deconditioning;Muscular Weakness;Shortness of Breath;Balance Concerns    Cardiac Risk Stratification  High       6 Minute Walk: 6 Minute Walk    Row Name 06/22/17 1340 10/15/17 0829       6 Minute Walk   Phase  Initial  -    Distance  1000 feet  1600 feet    Distance % Change  0 %  60 %    Distance Feet Change  0 ft  600 ft    Walk Time  6 minutes  6 minutes    # of Rest Breaks  0  0    MPH  1.89  3.03    METS  1.59  3.32    RPE  10  15    Perceived Dyspnea   13  13    VO2 Peak  11.3  16.8    Symptoms  No  No    Resting HR  77 bpm  101 bpm    Resting BP  124/88  148/88    Resting Oxygen Saturation   95 %  93 %    Exercise Oxygen Saturation  during 6 min walk  91 %  93 %    Max Ex. HR  103 bpm  122 bpm    Max Ex. BP  184/100  206/94    2 Minute Post BP  126/84  150/84       Oxygen Initial Assessment:   Oxygen Re-Evaluation: Oxygen Re-Evaluation    Row Name 08/19/17 1302 10/08/17 1553           Program Oxygen Prescription   Program Oxygen Prescription  None  None        Home Oxygen   Home Oxygen Device  Home Concentrator  Home Concentrator      Sleep Oxygen Prescription  CPAP  CPAP      Liters per minute  2  2      Home Exercise Oxygen Prescription  None  None      Home at Rest Exercise Oxygen Prescription  None  None      Compliance with Home Oxygen Use  Yes  Yes        Goals/Expected Outcomes   Short Term Goals  To learn and exhibit compliance with exercise, home and  travel O2 prescription  To learn and exhibit compliance with exercise, home and travel O2 prescription      Long  Term Goals  Exhibits compliance with exercise, home and travel O2 prescription  Exhibits compliance with exercise, home and travel O2 prescription      Comments  Patient uses O2 with CPAP at night at 2 L/Min. He is complaint with his prescription.  Patient uses O2 with CPAP at night at 2 L/Min. He is complaint with his prescription.      Goals/Expected Outcomes  Patient will continue to be complaint with his O2/CPAP prescription.   Patient will continue to be complaint with his O2/CPAP prescription.          Oxygen Discharge (Final Oxygen Re-Evaluation): Oxygen Re-Evaluation - 10/08/17 1553      Program Oxygen Prescription   Program Oxygen Prescription  None      Home Oxygen   Home Oxygen Device  Home Concentrator    Sleep Oxygen Prescription  CPAP    Liters per minute  2    Home Exercise Oxygen Prescription  None    Home at Rest Exercise Oxygen Prescription  None    Compliance with Home Oxygen Use  Yes      Goals/Expected Outcomes   Short Term Goals  To learn and exhibit compliance with exercise, home and travel O2 prescription    Long  Term Goals  Exhibits compliance with exercise, home and travel O2 prescription    Comments  Patient uses O2 with CPAP at night at 2 L/Min. He is complaint with his prescription.    Goals/Expected Outcomes  Patient will continue to be complaint with his O2/CPAP prescription.        Initial Exercise Prescription: Initial Exercise Prescription - 06/22/17 1300      Date of Initial Exercise RX and Referring Provider   Date  06/22/17    Referring Provider  Dr. [name]      NuStep   Level  2    SPM  50    Minutes  20    METs  1.7      Arm Ergometer   Level  1.5    Watts  12    RPM  12    Minutes  15    METs  1.9      Prescription Details   Frequency (times per week)  3    Duration  Progress to 30 minutes of continuous aerobic  without signs/symptoms of physical distress      Intensity   THRR 40-80% of Max Heartrate  113-132-150    Ratings of Perceived Exertion  11-13    Perceived Dyspnea  0-4      Progression   Progression  Continue progressive overload as per policy without signs/symptoms or physical distress.      Resistance Training   Training Prescription  Yes    Weight  1    Reps  10-15       Perform Capillary Blood Glucose checks as needed.  Exercise Prescription Changes:  Exercise Prescription Changes    Row Name 07/08/17 0800 07/15/17 1400 07/28/17 0800 08/18/17 1000 09/02/17 0700     Response to Exercise   Blood Pressure (Admit)  -  142/74  132/76  148/80  142/80   Blood Pressure (Exercise)  -  150/78  156/84  150/80  168/70   Blood Pressure (Exit)  -  138/74  128/72  130/80  128/74   Heart Rate (Admit)  -  91 bpm  92 bpm  82 bpm  83 bpm   Heart Rate (Exercise)  -  107 bpm  112 bpm  108 bpm  106 bpm   Heart Rate (Exit)  -  98 bpm  102 bpm  92 bpm  90 bpm   Rating of Perceived Exertion (Exercise)  -  _0 Duration  -  Progress to 30 minutes of  aerobic without signs/symptoms of physical distress  Progress to 30 minutes of  aerobic without signs/symptoms of physical distress  Progress to 30 minutes of  aerobic without signs/symptoms of physical distress  Progress to 30 minutes of  aerobic without signs/symptoms of physical distress   Intensity  -  THRR New 122-137-153  THRR New 122-138-153  THRR New 116-134-151  THRR New [phone]     Progression   Progression  -  Continue to progress workloads to maintain intensity without signs/symptoms of physical distress.  Continue to progress workloads to maintain intensity without signs/symptoms of physical distress.  Continue to progress workloads to  maintain intensity without signs/symptoms of physical distress.  Continue to progress workloads to maintain intensity without signs/symptoms of physical distress.     Resistance Training    Training Prescription  Yes  Yes  Yes  Yes  Yes   Weight  _0 Reps  10-15  10-15  10-15  10-15  10-15     Treadmill   MPH  -  1.2  1.4  1.5  1.5   Grade  -  0  0  0  0   Minutes  -  _1 METs  -  1.9  2  2.1  2.1     NuStep   Level  _2 SPM  50  105  133  117  112   Minutes  _3 METs  1.7  3.4  5.5  3.5  3.7     Arm Ergometer   Level  1.5  -  -  -  -   Watts  12  -  -  -  -   RPM  12  -  -  -  -   Minutes  15  -  -  -  -   METs  1.9  -  -  -  -     Home Exercise Plan   Plans to continue exercise at  Home (comment)  Home (comment)  Home (comment)  Home (comment)  Home (comment)   Frequency  Add 2 additional days to program exercise sessions.  Add 2 additional days to program exercise sessions.  Add 2 additional days to program exercise sessions.  Add 2 additional days to program exercise sessions.  Add 2 additional days to program exercise sessions.   Initial Home Exercises Provided  07/08/17  07/08/17  07/08/17  07/08/17  07/08/17   Row Name 09/14/17 1100 09/29/17 1200 10/08/17 1500         Response to Exercise   Blood Pressure (Admit)  140/74  138/82  144/70     Blood Pressure (Exercise)  156/80  164/70  150/64     Blood Pressure (Exit)  124/68  132/68  140/70     Heart Rate (Admit)  89 bpm  74 bpm  86 bpm     Heart Rate (Exercise)  115 bpm  109 bpm  103 bpm     Heart Rate (Exit)  93 bpm  100 bpm  95 bpm     Rating of Perceived Exertion (Exercise)  _4 Duration  Progress to 30 minutes of  aerobic without signs/symptoms of physical distress  Progress to 30 minutes of  aerobic without signs/symptoms of physical distress  Progress to 30 minutes of  aerobic without signs/symptoms of physical distress     Intensity  THRR New 121-136-152  THRR New 112-130-149  THRR New [phone]       Progression   Progression  Continue to progress workloads to maintain intensity without signs/symptoms of physical distress.   Continue to progress workloads to maintain intensity without signs/symptoms of physical distress.  Continue to progress workloads to maintain intensity without signs/symptoms of physical distress.       Resistance Training   Training Prescription  Yes  Yes  Yes     Weight  _0 Reps  10-15  10-15  10-15       Treadmill   MPH  1.6  1.6  1.6     Grade  0  0  0     Minutes  _1 METs  2.2  2.2  2.2       NuStep   Level  _2 SPM  115  101  97     Minutes  _3 METs  3.7  3.7  3.2       Home Exercise Plan   Plans to continue exercise at  Home (comment)  Home (comment)  Home (comment)     Frequency  Add 2 additional days to program exercise sessions.  Add 2 additional days to program exercise sessions.  Add 2 additional days to program exercise sessions.     Initial Home Exercises Provided  07/08/17  07/08/17  07/08/17        Exercise Comments:  Exercise Comments    Row Name 06/25/17 1529 07/08/17 0836 07/28/17 0816 08/18/17 1003 09/02/17 0748   Exercise Comments  Patient has not yet started CR and will be progressed in time.   Patient received the take home exercise plan today 07/08/2017. THR was addressed as were safe ways to be active outside of Rehab. Patient demonstrated an understanding. Patient was encouraged to ask any future questions.   Patient is doing very well in CR. He has increased his spee on the treadmill and has increased his watts and METs very high in the Nustep machine. He also has a lower blood pressure than before. He also has gone up in weight for teh warm up to two pounds.   Patient is doing very well in CR. He has increased his speed on the treadmill as well as his level on the Nustep. He does not use the handle bars on the Nustep due to shoulder pain. He states that this program has helped him gain endurance and stamina enough to walk and play with his dog. He has also decreased his usage of cigarrettes.  Patient is doing well in CR  and has maintained his levels and SPMs on the machines. He states that he feels that he is getting stronger and more stamina.    [place] Name 09/14/17 1115 09/29/17 1236         Exercise Comments  Patient is doing very well in CR he has increased his speed on the treadmill an dhas maintained his level on the Nustep. He states that he continues to walk his dog regularly and has more energy for that as well.  Patient is doing well in CR. He continues to maintain his levels on his machines. Patient has moved up on weights for the warm up routine.          Exercise Goals and Review:  Exercise Goals    Row Name 06/22/17 1342             Exercise Goals   Increase Physical Activity  Yes       Intervention  Provide advice, education, support and counseling about physical activity/exercise needs.;Develop an individualized exercise prescription for aerobic and resistive training based on initial evaluation findings, risk stratification, comorbidities and participant's personal goals.       Expected Outcomes  Achievement of increased cardiorespiratory  fitness and enhanced flexibility, muscular endurance and strength shown through measurements of functional capacity and personal statement of participant.       Increase Strength and Stamina  Yes       Intervention  Provide advice, education, support and counseling about physical activity/exercise needs.;Develop an individualized exercise prescription for aerobic and resistive training based on initial evaluation findings, risk stratification, comorbidities and participant's personal goals.       Expected Outcomes  Achievement of increased cardiorespiratory fitness and enhanced flexibility, muscular endurance and strength shown through measurements of functional capacity and personal statement of participant.       Able to understand and use rate of perceived exertion (RPE) scale  Yes       Intervention  Provide education and explanation on how to use RPE  scale       Expected Outcomes  Short Term: Able to use RPE daily in rehab to express subjective intensity level;Long Term:  Able to use RPE to guide intensity level when exercising independently       Able to understand and use Dyspnea scale  Yes       Intervention  Provide education and explanation on how to use Dyspnea scale       Expected Outcomes  Short Term: Able to use Dyspnea scale daily in rehab to express subjective sense of shortness of breath during exertion;Long Term: Able to use Dyspnea scale to guide intensity level when exercising independently       Knowledge and understanding of Target Heart Rate Range (THRR)  Yes       Intervention  Provide education and explanation of THRR including how the numbers were predicted and where they are located for reference       Expected Outcomes  Short Term: Able to state/look up THRR       Able to check pulse independently  Yes       Intervention  Provide education and demonstration on how to check pulse in carotid and radial arteries.;Review the importance of being able to check your own pulse for safety during independent exercise       Expected Outcomes  Short Term: Able to explain why pulse checking is important during independent exercise;Long Term: Able to check pulse independently and accurately       Understanding of Exercise Prescription  Yes       Intervention  Provide education, explanation, and written materials on patient's individual exercise prescription       Expected Outcomes  Short Term: Able to explain program exercise prescription;Long Term: Able to explain home exercise prescription to exercise independently          Exercise Goals Re-Evaluation : Exercise Goals Re-Evaluation    Row Name 07/28/17 0814 08/18/17 1001 09/14/17 1113 10/08/17 1511       Exercise Goal Re-Evaluation   Exercise Goals Review  Increase Physical Activity;Increase Strength and Stamina;Knowledge and understanding of Target Heart Rate Range (THRR)   Increase Physical Activity;Increase Strength and Stamina;Knowledge and understanding of Target Heart Rate Range (THRR)  Increase Physical Activity;Increase Strength and Stamina;Knowledge and understanding of Target Heart Rate Range (THRR)  Increase Physical Activity;Increase Strength and Stamina;Knowledge and understanding of Target Heart Rate Range (THRR)    Comments  Patient is doing very well in CR. He has increased his spee on the treadmill and has increased his watts and METs very high in the Nustep machine. He also has a lower blood pressure than before. He also has gone up  in weight for teh warm up to two pounds.   Patient is doing very well in CR. He has increased his speed on the treadmill as well as his level on the Nustep. He does not use the handle bars on the Nustep due to shoulder pain. He states that this program has helped him gain endurance and stamina enough to walk and play with his dog. He has also decreased his usage of cigarrettes.   Patient is doing very well in CR he has increased his speed on the treadmill an dhas maintained his level on the Nustep. He states that he continues to walk his dog regularly and has more energy for that as well.   Patient continues to improve in CR. Patient maintains his levels on all machines and states that he is active on days that he is not present in CR. Patient has shown great improvement throughout the program and is attempting to quit smoking.     Expected Outcomes  Patient wishes to breathe better and to lose weight.   Patient wishes to breathe better and to lose weight over time.   Patient wishes to be able to breathe better and to lose weight.   Patient wishes to be able to breathe better and to lose weight.         Discharge Exercise Prescription (Final Exercise Prescription Changes): Exercise Prescription Changes - 10/08/17 1500      Response to Exercise   Blood Pressure (Admit)  144/70    Blood Pressure (Exercise)  150/64    Blood  Pressure (Exit)  140/70    Heart Rate (Admit)  86 bpm    Heart Rate (Exercise)  103 bpm    Heart Rate (Exit)  95 bpm    Rating of Perceived Exertion (Exercise)  12    Duration  Progress to 30 minutes of  aerobic without signs/symptoms of physical distress    Intensity  THRR New [phone]      Progression   Progression  Continue to progress workloads to maintain intensity without signs/symptoms of physical distress.      Resistance Training   Training Prescription  Yes    Weight  4    Reps  10-15      Treadmill   MPH  1.6    Grade  0    Minutes  15    METs  2.2      NuStep   Level  3    SPM  97    Minutes  20    METs  3.2      Home Exercise Plan   Plans to continue exercise at  Home (comment)    Frequency  Add 2 additional days to program exercise sessions.    Initial Home Exercises Provided  07/08/17       Nutrition:  Target Goals: Understanding of nutrition guidelines, daily intake of sodium <1526m, cholesterol <2021m calories 30% from fat and 7% or less from saturated fats, daily to have 5 or more servings of fruits and vegetables.  Biometrics: Pre Biometrics - 06/22/17 1343      Pre Biometrics   Height  5' 6" (1.676 m)    Weight  230 lb 9.6 oz (104.6 kg)    Waist Circumference  50 inches    Hip Circumference  43 inches    Waist to Hip Ratio  1.16 %    BMI (Calculated)  37.24    Triceps Skinfold  6 mm    %  Body Fat  32 %    Grip Strength  62.33 kg    Flexibility  0 in    Single Leg Stand  15 seconds      Post Biometrics - 10/15/17 0830       Post  Biometrics   Height  5' 6" (1.676 m)    Weight  222 lb 7.1 oz (100.9 kg)    Waist Circumference  48 inches    Hip Circumference  41.5 inches    Waist to Hip Ratio  1.16 %    BMI (Calculated)  35.92    Triceps Skinfold  6 mm    % Body Fat  30.5 %    Grip Strength  77.2 kg    Flexibility  0 in    Single Leg Stand  33 seconds       Nutrition Therapy Plan and Nutrition Goals: Nutrition Therapy &  Goals - 08/06/17 1518      Personal Nutrition Goals   Nutrition Goal  For heart healthy choices add >50% of whole grains, make half their plate fruits and vegetables. Discuss the difference between starchy vegetables and leafy greens, and how leafy vegetables provide fiber, helps maintain healthy weight, helps control blood glucose, and lowers cholesterol.  Discuss purchasing fresh or frozen vegetable to reduce sodium and not to add grease, fat or sugar. Consume <18oz of red meat per week. Consume lean cuts of meats and very little of meats high in sodium and nitrates such as pork and lunch meats. Discussed portion control for all food groups.      Personal Goal #2  Continues to eat heart healthy low NA diabetic diet.     Additional Goals?  No    Comments  Patient attended nutrition class.       Intervention Plan   Intervention  Prescribe, educate and counsel regarding individualized specific dietary modifications aiming towards targeted core components such as weight, hypertension, lipid management, diabetes, heart failure and other comorbidities.;Nutrition handout(s) given to patient.    Expected Outcomes  Short Term Goal: Understand basic principles of dietary content, such as calories, fat, sodium, cholesterol and nutrients.       Nutrition Assessments: Nutrition Assessments - 10/16/17 1424      MEDFICTS Scores   Pre Score  64    Post Score  52    Score Difference  -12       Nutrition Goals Re-Evaluation: Nutrition Goals Re-Evaluation    [place] Name 08/19/17 1305 09/14/17 1328 10/08/17 1526         Goals   Current Weight  227 lb (103 kg)  222 lb 1.6 oz (100.7 kg)  218 lb 8 oz (99.1 kg)     Nutrition Goal  For heart healthy choices add >50% of whole grains, make half their plate fruits and vegetables. Discuss the difference between starchy vegetables and leafy greens, and how leafy vegetables provide fiber, helps maintain healthy weight, helps control blood glucose, and lowers  cholesterol.  Discuss purchasing fresh or frozen vegetable to reduce sodium and not to add grease, fat or sugar. Consume <18oz of red meat per week. Consume lean cuts of meats and very little of meats high in sodium and nitrates such as pork and lunch meats. Discussed portion control for all food groups.  For heart healthy choices add >50% of whole grains, make half their plate fruits and vegetables. Discuss the difference between starchy vegetables and leafy greens, and how leafy vegetables provide fiber, helps  maintain healthy weight, helps control blood glucose, and lowers cholesterol.  Discuss purchasing fresh or frozen vegetable to reduce sodium and not to add grease, fat or sugar. Consume <18oz of red meat per week. Consume lean cuts of meats and very little of meats high in sodium and nitrates such as pork and lunch meats. Discussed portion control for all food groups.  For heart healthy choices add >50% of whole grains, make half their plate fruits and vegetables. Discuss the difference between starchy vegetables and leafy greens, and how leafy vegetables provide fiber, helps maintain healthy weight, helps control blood glucose, and lowers cholesterol.  Discuss purchasing fresh or frozen vegetable to reduce sodium and not to add grease, fat or sugar. Consume <18oz of red meat per week. Consume lean cuts of meats and very little of meats high in sodium and nitrates such as pork and lunch meats. Discussed portion control for all food groups.     Comment  Patient has lost 3 lbs and is continuing to follow a heart healthy, low Na and diabetic diet. He says he is meeting with a dietician tomorrow to start on a reduced calorie diet. Will continue to monitor.   Patient has lost 8 lbs and is continuing to follow a heart healthy, low Na and diabetic diet. He met with dietician and is trying to follow a reduced calorie diet Will continue to monitor.   Patient has lost 12.5 lbs since he started the program. He  continues to follow a reduced calorie diet. Will continue to monitor for progress.      Expected Outcome  Patient will continue to meet his nutrtion goals at discharge.   Patient will continue to meet his nutrtion goals at discharge.   Patient will continue to meet his nutrtion goals at discharge.        Personal Goal #2 Re-Evaluation   Personal Goal #2  Eat heart healthy/ low na and diabetic diet.   Eat heart healthy/ low na and diabetic, reduced calorie diet.   Eat heart healthy/ low na and diabetic, reduced calorie diet.         Nutrition Goals Discharge (Final Nutrition Goals Re-Evaluation): Nutrition Goals Re-Evaluation - 10/08/17 1526      Goals   Current Weight  218 lb 8 oz (99.1 kg)    Nutrition Goal  For heart healthy choices add >50% of whole grains, make half their plate fruits and vegetables. Discuss the difference between starchy vegetables and leafy greens, and how leafy vegetables provide fiber, helps maintain healthy weight, helps control blood glucose, and lowers cholesterol.  Discuss purchasing fresh or frozen vegetable to reduce sodium and not to add grease, fat or sugar. Consume <18oz of red meat per week. Consume lean cuts of meats and very little of meats high in sodium and nitrates such as pork and lunch meats. Discussed portion control for all food groups.    Comment  Patient has lost 12.5 lbs since he started the program. He continues to follow a reduced calorie diet. Will continue to monitor for progress.     Expected Outcome  Patient will continue to meet his nutrtion goals at discharge.       Personal Goal #2 Re-Evaluation   Personal Goal #2  Eat heart healthy/ low na and diabetic, reduced calorie diet.        Psychosocial: Target Goals: Acknowledge presence or absence of significant depression and/or stress, maximize coping skills, provide positive support system. Participant is able to verbalize  types and ability to use techniques and skills needed for reducing  stress and depression.  Initial Review & Psychosocial Screening: Initial Psych Review & Screening - 06/22/17 1530      Initial Review   Current issues with  Current Depression;Current Anxiety/Panic      Family Dynamics   Good Support System?  Yes      Barriers   Psychosocial barriers to participate in program  There are no identifiable barriers or psychosocial needs.;The patient should benefit from training in stress management and relaxation.      Screening Interventions   Interventions  Encouraged to exercise;Provide feedback about the scores to participant       Quality of Life Scores: Quality of Life - 10/15/17 0831      Quality of Life Scores   Health/Function Pre  9 %    Health/Function Post  14 %    Health/Function % Change  55.56 %    Socioeconomic Pre  13 %    Socioeconomic Post  19.71 %    Socioeconomic % Change   51.62 %    Psych/Spiritual Pre  17.07 %    Psych/Spiritual Post  17.71 %    Psych/Spiritual % Change  3.75 %    Family Pre  16.8 %    Family Post  27.6 %    Family % Change  64.29 %    GLOBAL Pre  12.64 %    GLOBAL Post  17.94 %    GLOBAL % Change  41.93 %      Scores of 19 and below usually indicate a poorer quality of life in these areas.  A difference of  2-3 points is a clinically meaningful difference.  A difference of 2-3 points in the total score of the Quality of Life Index has been associated with significant improvement in overall quality of life, self-image, physical symptoms, and general health in studies assessing change in quality of life.  PHQ-9: Recent Review Flowsheet Data    Depression screen [facility] 2/9 10/16/2017 09/18/2017 08/20/2017 08/17/2017 06/22/2017   Decreased Interest 0 0 2 0 2   Down, Depressed, Hopeless 2 0 2 0 2   PHQ - 2 Score 2 0 4 0 4   Altered sleeping 1 - 2 - 2   Tired, decreased energy 1 - 1 - 2   Change in appetite 0 - 2 - 3   Feeling bad or failure about yourself  3 - 0 - 3   Trouble concentrating 1 - 0 - 1    Moving slowly or fidgety/restless 1 - 0 - 0   Suicidal thoughts 0 - 0 - 0   PHQ-9 Score 9 - 9 - 15   Difficult doing work/chores Very difficult - Very difficult - Extremely dIfficult     Interpretation of Total Score  Total Score Depression Severity:  1-4 = Minimal depression, 5-9 = Mild depression, 10-14 = Moderate depression, 15-19 = Moderately severe depression, 20-27 = Severe depression   Psychosocial Evaluation and Intervention: Psychosocial Evaluation - 10/16/17 1430      Discharge Psychosocial Assessment & Intervention   Comments  Patient continues to be treated for depression with counseling and Celexa daily. His exit QOL scores improved by 41.93% at 17.94% and his PHQ-9 score also improved by 5 at 10. He says his depression is managed with his treatment.        Psychosocial Re-Evaluation: Psychosocial Re-Evaluation    [place] Name 07/29/17 1455 08/19/17 1317 09/14/17 1337  10/08/17 1528       Psychosocial Re-Evaluation   Current issues with  Current Depression;Current Anxiety/Panic  Current Anxiety/Panic;Current Depression  Current Anxiety/Panic;Current Depression  Current Anxiety/Panic;Current Depression    Comments  Patient's initial QOL was 12.64 and his PHQ-9 was 15. He is seeing a Social worker and is being treated with trazadone and alprazolam.   Patient depression and anxiety continue to be managed with Trazadone and as needed Alprazolam.   Patient depression and anxiety continue to be managed with Trazadone and as needed Alprazolam.   Patient depression and anxiety continue to be managed with Trazadone and as needed Alprazolam.     Expected Outcomes  Patient's depression and anxiety will be managed at discharge with no additional psychosocial barriers identified.   Patient's depression and anxiety will continue to be managed at discharge with no other psychosocial issues identified.   Patient's depression and anxiety will continue to be managed at discharge with no other  psychosocial issues identified.   Patient's depression and anxiety will continue to be managed at discharge with no other psychosocial issues identified.     Interventions  Stress management education;Encouraged to attend Cardiac Rehabilitation for the exercise  Stress management education;Encouraged to attend Cardiac Rehabilitation for the exercise;Relaxation education  Stress management education;Encouraged to attend Cardiac Rehabilitation for the exercise;Relaxation education  Stress management education;Encouraged to attend Cardiac Rehabilitation for the exercise;Relaxation education    Continue Psychosocial Services   Follow up required by staff  Follow up required by staff  Follow up required by staff  Follow up required by staff       Psychosocial Discharge (Final Psychosocial Re-Evaluation): Psychosocial Re-Evaluation - 10/08/17 1528      Psychosocial Re-Evaluation   Current issues with  Current Anxiety/Panic;Current Depression    Comments  Patient depression and anxiety continue to be managed with Trazadone and as needed Alprazolam.     Expected Outcomes  Patient's depression and anxiety will continue to be managed at discharge with no other psychosocial issues identified.     Interventions  Stress management education;Encouraged to attend Cardiac Rehabilitation for the exercise;Relaxation education    Continue Psychosocial Services   Follow up required by staff       Vocational Rehabilitation: Provide vocational rehab assistance to qualifying candidates.   Vocational Rehab Evaluation & Intervention: Vocational Rehab - 06/22/17 1524      Initial Vocational Rehab Evaluation & Intervention   Assessment shows need for Vocational Rehabilitation  No       Education: Education Goals: Education classes will be provided on a weekly basis, covering required topics. Participant will state understanding/return demonstration of topics presented.  Learning Barriers/Preferences: Learning  Barriers/Preferences - 06/22/17 1520      Learning Barriers/Preferences   Learning Barriers  None    Learning Preferences  Skilled Demonstration;Individual Instruction;Group Instruction;Video;Written Material;Pictoral       Education Topics: Hypertension, Hypertension Reduction -Define heart disease and high blood pressure. Discus how high blood pressure affects the body and ways to reduce high blood pressure.   CARDIAC REHAB PHASE II EXERCISE from 10/07/2017 in [place]  Date  07/08/17  Educator  [place]  Instruction Review Code  2- Demonstrated Understanding      Exercise and Your Heart -Discuss why it is important to exercise, the FITT principles of exercise, normal and abnormal responses to exercise, and how to exercise safely.   CARDIAC REHAB PHASE II EXERCISE from 10/07/2017 in [place]  Date  07/15/17  Educator  DC  Instruction Review Code  2- Demonstrated Understanding      Angina -Discuss definition of angina, causes of angina, treatment of angina, and how to decrease risk of having angina.   CARDIAC REHAB PHASE II EXERCISE from 10/07/2017 in [place]  Date  07/22/17  Educator  D. Coad  Instruction Review Code  2- Demonstrated Understanding      Cardiac Medications -Review what the following cardiac medications are used for, how they affect the body, and side effects that may occur when taking the medications.  Medications include Aspirin, Beta blockers, calcium channel blockers, ACE Inhibitors, angiotensin receptor blockers, diuretics, digoxin, and antihyperlipidemics.   CARDIAC REHAB PHASE II EXERCISE from 10/07/2017 in [place]  Date  07/29/17  Educator  DJ  Instruction Review Code  2- Demonstrated Understanding      Congestive Heart Failure -Discuss the definition of CHF, how to live with CHF, the signs and symptoms of CHF, and how keep track of weight and sodium intake.    CARDIAC REHAB PHASE II EXERCISE from 10/07/2017 in [place]  Date  08/05/17  Educator  DC  Instruction Review Code  2- Demonstrated Understanding      Heart Disease and Intimacy -Discus the effect sexual activity has on the heart, how changes occur during intimacy as we age, and safety during sexual activity.   Smoking Cessation / COPD -Discuss different methods to quit smoking, the health benefits of quitting smoking, and the definition of COPD.   CARDIAC REHAB PHASE II EXERCISE from 10/07/2017 in [place]  Date  08/19/17  Educator  DC  Instruction Review Code  2- Demonstrated Understanding      Nutrition I: Fats -Discuss the types of cholesterol, what cholesterol does to the heart, and how cholesterol levels can be controlled.   Nutrition II: Labels -Discuss the different components of food labels and how to read food label   CARDIAC REHAB PHASE II EXERCISE from 10/07/2017 in [place]  Date  09/02/17  Educator  [place]  Instruction Review Code  2- Demonstrated Understanding      Heart Parts/Heart Disease and PAD -Discuss the anatomy of the heart, the pathway of blood circulation through the heart, and these are affected by heart disease.   CARDIAC REHAB PHASE II EXERCISE from 10/07/2017 in [place]  Date  09/09/17  Educator  DJ  Instruction Review Code  2- Demonstrated Understanding      Stress I: Signs and Symptoms -Discuss the causes of stress, how stress may lead to anxiety and depression, and ways to limit stress.   CARDIAC REHAB PHASE II EXERCISE from 10/07/2017 in [place]  Date  09/16/17  Educator  DJ  Instruction Review Code  2- Demonstrated Understanding      Stress II: Relaxation -Discuss different types of relaxation techniques to limit stress.   CARDIAC REHAB PHASE II EXERCISE from 10/07/2017 in [place]  Date   09/23/17  Educator  DC  Instruction Review Code  2- Demonstrated Understanding      Warning Signs of Stroke / TIA -Discuss definition of a stroke, what the signs and symptoms are of a stroke, and how to identify when someone is having stroke.   CARDIAC REHAB PHASE II EXERCISE from 10/07/2017 in [place]  Date  09/30/17  Educator  DC  Instruction Review Code  2- Demonstrated Understanding  Knowledge Questionnaire Score: Knowledge Questionnaire Score - 10/16/17 1424      Knowledge Questionnaire Score   Pre Score  23/24    Post Score  26/28       Core Components/Risk Factors/Patient Goals at Admission: Personal Goals and Risk Factors at Admission - 06/22/17 1527      Core Components/Risk Factors/Patient Goals on Admission    Weight Management  Yes    Admit Weight  202 lb (91.6 kg)    Goal Weight: Short Term  177 lb (80.3 kg)    Goal Weight: Long Term  152 lb (68.9 kg)    Expected Outcomes  Short Term: Continue to assess and modify interventions until short term weight is achieved;Long Term: Adherence to nutrition and physical activity/exercise program aimed toward attainment of established weight goal    Tobacco Cessation  Yes    Intervention  Assist the participant in steps to quit. Provide individualized education and counseling about committing to Tobacco Cessation, relapse prevention, and pharmacological support that can be provided by physician.    Expected Outcomes  Short Term: Will quit all tobacco product use, adhering to prevention of relapse plan.;Long Term: Complete abstinence from all tobacco products for at least 12 months from quit date.    Improve shortness of breath with ADL's  Yes    Intervention  Provide education, individualized exercise plan and daily activity instruction to help decrease symptoms of SOB with activities of daily living.    Expected Outcomes  Short Term: Achieves a reduction of symptoms when performing activities of  daily living.    Stress  Yes    Intervention  Offer individual and/or small group education and counseling on adjustment to heart disease, stress management and health-related lifestyle change. Teach and support self-help strategies.    Expected Outcomes  Short Term: Participant demonstrates changes in health-related behavior, relaxation and other stress management skills, ability to obtain effective social support, and compliance with psychotropic medications if prescribed.    Personal Goal Other  Yes    Personal Goal  Breathe better, Lose 50 lbs    Intervention  Attend program 3 x week and supplement 2 x week exercise at home    Expected Outcomes  Meet personal goals       Core Components/Risk Factors/Patient Goals Review:  Goals and Risk Factor Review    [place] Name 07/29/17 1449 08/19/17 1309 09/14/17 1330 10/08/17 1522 10/16/17 1426     Core Components/Risk Factors/Patient Goals Review   Personal Goals Review  Weight Management/Obesity;Diabetes;Tobacco Cessation Breathe better; lose 50 lbs long term.   Weight Management/Obesity;Improve shortness of breath with ADL's;Diabetes;Tobacco Cessation Breathe better; lose 50 lbs long term.   Weight Management/Obesity;Improve shortness of breath with ADL's;Diabetes;Tobacco Cessation  Weight Management/Obesity;Improve shortness of breath with ADL's;Diabetes;Tobacco Cessation Breathe better; lose 50 lbs long term.  Weight Management/Obesity;Improve shortness of breath with ADL's;Diabetes;Tobacco Cessation Breathe better; lose 50 lbs.   Review  Patient has completed 11 sessions gaining 1 lb. He is doing well in the program with progression. He continues to smoke but says he is trying to cut back. He is trying to alter his diet by cutting out soda's and cutting down on candy and sweets. His last A1C was 7.6 on 04/15/17. He says he is breathing better noting less SOB with activity. He says he feels stronger and is walking more at home. He has a stationary bike  and hopes to work up to riding it. Will continue to monitor.   Patient has  completed 17 sessions losing 4 lbs. Patient has reduced his smoking to 1/2 pk/day. He is not interested in tobacco cessation program but says he can quit on his own. His last A1C was 03/10/16 8.2. He states he has more energy and feels stronger. He is able to do more around the house now and is walking more and further. Will continue to monitor.   Patient has completed 24 sessions losing 8 lbs. Patient has stopped buying cigerattes and is using a nicotine patch. He says he has borrowed cigerattes from family and friends occasionally. His last A1C was 08/22/17 7.6 which was down from his last in July 2017 of 8.2. He continues to have more energy and feels stronger with less SOB. He is able to walk more and further. He is able to walk 2 blocks now without difficulty.  Will continue to monitor.   Patient has completed 34 sessions losing 12.5 lbs. He continues to smoke occasionally and is still using a nicotine patch. His reported glucose readings have been WNL with his last A1C 08/22/17 at 7.6. He was put on prednisone 10 mg for 10 days to treat chronic left hip and low back pain appx 2 weeks ago. He has completed the medication with improvement in his pain. He continues to progress in the program stating he feels a lot better overall and is stronger and less SOB. He contines to be able to walk 2 blocks without difficulty. He plans to join our [facility] maintenance program after he graduates. Will continue to monitor for progress.   Patient graduated with 36 sessions losing 12 lbs overall. He did great in the program. He continues to smoke but has decreased his usage and is using a nicotine patch. He reported fasting glucose reading are WNL. His exit measurements improved in balance and his exit walk test improved by 60%. He says he feel a lot better since he started and is able to walk further than he has in a long time. He says he feels  stronger and less SOB. He plans to join our [employer] program to continue exercising.    Expected Outcomes  Patient will continue to attend sessions and complete the program and continue to meet his personal goals.   Patient will complete the program and continue to meet his personal goals.   Patient will complete the program and continue to meet his personal goals.   Patient will complete the program and continue to meet his personal goals.   Patient will continue exercising in our [place] Maintenance program and continue to meet his personal goals. CR will f/u for one year.       Core Components/Risk Factors/Patient Goals at Discharge (Final Review):  Goals and Risk Factor Review - 10/16/17 1426      Core Components/Risk Factors/Patient Goals Review   Personal Goals Review  Weight Management/Obesity;Improve shortness of breath with ADL's;Diabetes;Tobacco Cessation Breathe better; lose 50 lbs.    Review  Patient graduated with 36 sessions losing 12 lbs overall. He did great in the program. He continues to smoke but has decreased his usage and is using a nicotine patch. He reported fasting glucose reading are WNL. His exit measurements improved in balance and his exit walk test improved by 60%. He says he feel a lot better since he started and is able to walk further than he has in a long time. He says he feels stronger and less SOB. He plans to join our [employer]  Maintenance program to continue exercising.     Expected Outcomes  Patient will continue exercising in our [place] Maintenance program and continue to meet his personal goals. CR will f/u for one year.        ITP Comments: ITP Comments    Row Name 06/29/17 0755 08/06/17 1519         ITP Comments  Patient new to program. He is currently being evaluated for a syncope episode. Plans to start 07/06/17. Will continue to monitor.   Pattient attended family matters with hospital chaplain to discuss how his recent  diagnosis has changed his life.          Comments: Patient graduated from [facility] today on 10/12/17 after completing 36 sessions. He achieved LTG of 30 minutes of aerobic exercise at Max Met level of 3.7. All patients vitals are WNL. Patient has met with dietician. Discharge instruction has been reviewed in detail and patient stated an understanding of material given. Patient plans to join our [employer] program to continue exercising. Cardiac Rehab staff will make f/u calls at 1 month, 6 months, and 1 year. Patient had no complaints of any abnormal S/S or pain on their exit visit.

## 2017-10-21 ENCOUNTER — Telehealth: Payer: Self-pay | Admitting: Nutrition

## 2017-10-21 ENCOUNTER — Encounter: Payer: Self-pay | Admitting: Nutrition

## 2017-10-21 ENCOUNTER — Telehealth: Payer: Self-pay | Admitting: Family Medicine

## 2017-10-21 ENCOUNTER — Encounter: Payer: Medicaid Other | Attending: Family Medicine | Admitting: Nutrition

## 2017-10-21 VITALS — Ht 66.0 in | Wt 220.0 lb

## 2017-10-21 DIAGNOSIS — E118 Type 2 diabetes mellitus with unspecified complications: Secondary | ICD-10-CM

## 2017-10-21 DIAGNOSIS — E119 Type 2 diabetes mellitus without complications: Secondary | ICD-10-CM | POA: Insufficient documentation

## 2017-10-21 DIAGNOSIS — E1165 Type 2 diabetes mellitus with hyperglycemia: Secondary | ICD-10-CM

## 2017-10-21 DIAGNOSIS — E782 Mixed hyperlipidemia: Secondary | ICD-10-CM

## 2017-10-21 DIAGNOSIS — Z713 Dietary counseling and surveillance: Secondary | ICD-10-CM | POA: Diagnosis not present

## 2017-10-21 DIAGNOSIS — IMO0002 Reserved for concepts with insufficient information to code with codable children: Secondary | ICD-10-CM

## 2017-10-21 MED ORDER — NITROGLYCERIN 0.4 MG SL SUBL: 0.4000 mg | SUBLINGUAL_TABLET | SUBLINGUAL | 0 refills | Status: DC | PRN

## 2017-10-21 NOTE — Telephone Encounter (Signed)
Opened in error

## 2017-10-21 NOTE — Progress Notes (Signed)
  Medical Nutrition Therapy:  Appt start time: 1000  end time:  1030.   Assessment:  Primary concerns today: Diabetes. In cardiac rehab. Has lost 5 lbs. Lost 2 inchces in his waist he says. Has been drinking more water. Has cut back eating out from [employer] and [employer]. Working on quitting smoking- smoking 10 cigarrettes a day now. Planning on quitting next week. Has been eating more vegetables and fruit.  Getting exercise on cardiac rehabs.   He is dong better overall with food choices, but seems to be slipping back into some bad habits with processed high saturated fat high salt foods.     He is willing to reduce those foods and focus on more fresh fruits and vegetables. BS reading brought in. 7 day avg 162 mg/dl, 30 day 172 mg/dl.  Lab Results  Component Value Date   HGBA1C 7.6 (H) 04/15/2017   Lipid Panel     Component Value Date/Time   CHOL 93 04/27/2017 0505   TRIG 206 (H) 04/27/2017 0505   HDL 18 (L) 04/27/2017 0505   CHOLHDL 5.2 04/27/2017 0505   VLDL 41 (H) 04/27/2017 0505   LDLCALC 34 04/27/2017 0505    Checking BS three times per week. Metformin 1000 mg BID. FBS: 120-140's.   Diet remains higher in fat and sodium and calories. Working on losing weight through cardiac rehab and eating better.  Motivated and engaged to make changes.   Preferred Learning Style:   No preference indicated   Learning Readiness:   Ready  Change in progress   MEDICATIONS: See chart   DIETARY INTAKE:  24-hr recall:  B ( AM): BLT and gravy with toast,  Or oatmeal or rice krispies.  Snk ( AM):  L ( PM): Ham sandwich on ww bread, fruit, or beanie weanies or chips. water Snk ( PM):  D ( PM):  Homemade brunswich stew 1 cup,  pb and mayonaise sandwich.and 10 crackers,   Snk ( PM):  Beverages: little water, juice.  Usual physical activity: ADL.  Estimated energy needs: 1800  calories 200 g carbohydrates 135 g protein 50 g fat  Progress Towards Goal(s):  In  progress.   Nutritional Diagnosis:  NB-1.1 Food and nutrition-related knowledge deficit As related to Diabetes.  As evidenced by A1C 7.6%.    Intervention: Nutrition and Diabetes education provided on My Plate, CHO counting, meal planning, portion sizes, timing of meals, avoiding snacks between meals unless having a low blood sugar, target ranges for A1C and blood sugars, signs/symptoms and treatment of hyper/hypoglycemia, monitoring blood sugars, taking medications as prescribed, benefits of exercising 30 minutes per day and prevention of complications of DM.   Teaching Method Utilized:  Visual Auditory Hands on  Handouts given during visit include:  The Plate Method  Meal Plan Card  Diabetes Instructions.   Barriers to learning/adherence to lifestyle change: none  Demonstrated degree of understanding via:  Teach Back   Monitoring/Evaluation:  Dietary intake, exercise, meal planning, SBG, and body weight in 3 month(s).

## 2017-10-21 NOTE — Telephone Encounter (Signed)
Seen 2 14 19

## 2017-10-21 NOTE — Telephone Encounter (Signed)
Pt aware.

## 2017-10-21 NOTE — Telephone Encounter (Signed)
nitroGLYCERIN (NITROSTAT) 0.4 MG SL tablet 25 tablet 3 02/09/2015    Sig - Route: Place 1 tablet (0.4 mg total) under the tongue every 5 (five) minutes as needed for chest pain. - Sublingual   E-Prescribing Status: Receipt confirmed by pharmacy (02/09/2015 12:31 PM EDT)     Pt would like a refill on this medicine, please call if there is a problem.

## 2017-10-21 NOTE — Patient Instructions (Addendum)
Goals 1. Cut out processed foods of pimento cheese, bacon, beanie weenies, and high fat meats 2. Increase more green beans, carrots, broccoli, salads and greens, asparagus 3. Eat 1 piece of fruit with meals or yogurt 4.  Continue walking 1-2 miles.  Get labs done. Keep follow appt for Dr. Dorris Fetch  Get A1C less 7%. Lose 4-5 lbs per month

## 2017-10-23 ENCOUNTER — Other Ambulatory Visit: Payer: Self-pay

## 2017-10-23 ENCOUNTER — Encounter (HOSPITAL_COMMUNITY): Payer: Self-pay | Admitting: Emergency Medicine

## 2017-10-23 ENCOUNTER — Emergency Department (HOSPITAL_COMMUNITY): Payer: Medicaid Other

## 2017-10-23 DIAGNOSIS — E1159 Type 2 diabetes mellitus with other circulatory complications: Secondary | ICD-10-CM | POA: Insufficient documentation

## 2017-10-23 DIAGNOSIS — M50122 Cervical disc disorder at C5-C6 level with radiculopathy: Secondary | ICD-10-CM | POA: Diagnosis not present

## 2017-10-23 DIAGNOSIS — Z96641 Presence of right artificial hip joint: Secondary | ICD-10-CM | POA: Insufficient documentation

## 2017-10-23 DIAGNOSIS — Z79899 Other long term (current) drug therapy: Secondary | ICD-10-CM | POA: Diagnosis not present

## 2017-10-23 DIAGNOSIS — J45909 Unspecified asthma, uncomplicated: Secondary | ICD-10-CM | POA: Diagnosis not present

## 2017-10-23 DIAGNOSIS — R200 Anesthesia of skin: Secondary | ICD-10-CM | POA: Diagnosis present

## 2017-10-23 DIAGNOSIS — Z7984 Long term (current) use of oral hypoglycemic drugs: Secondary | ICD-10-CM | POA: Insufficient documentation

## 2017-10-23 DIAGNOSIS — E1165 Type 2 diabetes mellitus with hyperglycemia: Secondary | ICD-10-CM | POA: Diagnosis not present

## 2017-10-23 DIAGNOSIS — I2510 Atherosclerotic heart disease of native coronary artery without angina pectoris: Secondary | ICD-10-CM | POA: Diagnosis not present

## 2017-10-23 DIAGNOSIS — J449 Chronic obstructive pulmonary disease, unspecified: Secondary | ICD-10-CM | POA: Insufficient documentation

## 2017-10-23 DIAGNOSIS — Z7982 Long term (current) use of aspirin: Secondary | ICD-10-CM | POA: Diagnosis not present

## 2017-10-23 DIAGNOSIS — G5601 Carpal tunnel syndrome, right upper limb: Secondary | ICD-10-CM | POA: Insufficient documentation

## 2017-10-23 DIAGNOSIS — I110 Hypertensive heart disease with heart failure: Secondary | ICD-10-CM | POA: Diagnosis not present

## 2017-10-23 DIAGNOSIS — I5042 Chronic combined systolic (congestive) and diastolic (congestive) heart failure: Secondary | ICD-10-CM | POA: Insufficient documentation

## 2017-10-23 DIAGNOSIS — F17210 Nicotine dependence, cigarettes, uncomplicated: Secondary | ICD-10-CM | POA: Insufficient documentation

## 2017-10-23 LAB — BASIC METABOLIC PANEL
ANION GAP: 14 (ref 5–15)
BUN: 16 mg/dL (ref 6–20)
CO2: 23 mmol/L (ref 22–32)
Calcium: 9.3 mg/dL (ref 8.9–10.3)
Chloride: 99 mmol/L — ABNORMAL LOW (ref 101–111)
Creatinine, Ser: 0.92 mg/dL (ref 0.61–1.24)
GFR calc Af Amer: >60 mL/min (ref >60)
GFR calc non Af Amer: >60 mL/min (ref >60)
GLUCOSE: 106 mg/dL — AB (ref 65–99)
POTASSIUM: 3.5 mmol/L (ref 3.5–5.1)
Sodium: 136 mmol/L (ref 135–145)

## 2017-10-23 LAB — CBC
HEMATOCRIT: 43.6 % (ref 39.0–52.0)
HEMOGLOBIN: 14.2 g/dL (ref 13.0–17.0)
MCH: 28.2 pg (ref 26.0–34.0)
MCHC: 32.6 g/dL (ref 30.0–36.0)
MCV: 86.7 fL (ref 78.0–100.0)
Platelets: 256 10*3/uL (ref 150–400)
RBC: 5.03 MIL/uL (ref 4.22–5.81)
RDW: 15.5 % (ref 11.5–15.5)
WBC: 11 10*3/uL — ABNORMAL HIGH (ref 4.0–10.5)

## 2017-10-23 LAB — TROPONIN I: Troponin I: <0.03 ng/mL (ref <0.03)

## 2017-10-23 NOTE — ED Triage Notes (Signed)
Patient reports R side arm pain when he awakens in the mornings. Patient states he has some numbness in his thumb and tingling in his fingers. Patient has cardiac history. EKG in progress.

## 2017-10-24 ENCOUNTER — Emergency Department (HOSPITAL_COMMUNITY)
Admission: EM | Admit: 2017-10-24 | Discharge: 2017-10-24 | Disposition: A | Discharge status: Home / Self Care | Payer: Medicaid Other | Attending: Emergency Medicine | Admitting: Emergency Medicine

## 2017-10-24 ENCOUNTER — Emergency Department (HOSPITAL_COMMUNITY): Payer: Medicaid Other

## 2017-10-24 DIAGNOSIS — G5601 Carpal tunnel syndrome, right upper limb: Secondary | ICD-10-CM

## 2017-10-24 DIAGNOSIS — M50122 Cervical disc disorder at C5-C6 level with radiculopathy: Secondary | ICD-10-CM

## 2017-10-24 MED ORDER — PREDNISONE 20 MG PO TABS: ORAL_TABLET | ORAL | 0 refills | Status: DC

## 2017-10-24 NOTE — ED Provider Notes (Signed)
[facility] EMERGENCY DEPARTMENT Provider Note   CSN: [id number] Arrival date & time: 10/23/17  2019  Time seen 02:25 AM    History   Chief Complaint Chief Complaint  Patient presents with  . Arm Pain    HPI Randy Hebert is a 52 y.o. male.  HPI patient states he has had tingling and numbness in his right arm about 3-4 times in the past week.  He states he normally wakes up with it.  Sometimes he has to use his other arm to pick up his right arm.  He states he will shake his arm and the feeling comes back in it.  He states that last about 5 minutes.  He states today he had a pain in his upper arm that radiates down to his right elbow.  He states that sharp "like someone punching my arm".  He states that last 10-15 seconds and then it returns.  He states that started about 3:30 PM.  Patient states he is right-handed.  When asked if he was having difficulty opening door handles or using his right hand he states he thinks maybe he was a little weaker but he was not sure.  He states he has chronic neck pain and he has seen Dr. Hal Neer in the past.  He states he was given "pills".  The last time he saw him was 2015.  States about a month ago he was prescribed prednisone for his acute on chronic low back pain.  He states today his right thumb is what felt numb.  He states for several years if he lays his left arm on a table it jerks and twist uncontrollably.  He was advised to follow-up with his doctor for that since that is chronic.  He states he gets frequent headaches because of his chronic neck pain.  He denies headache at this time.  He denies any nausea or vomiting or change in his vision.  He states he is seeing Dr. Ninfa Linden, orthopedist in the past.  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  PCP Raylene Everts, MD Neurosurgery Dr Hal Neer Orthopedics Dr Ninfa Linden  Past Medical History:  Diagnosis Date  . Allergy   .  Anxiety   . Arthritis   . Asthma    hip replacement  . Back pain   . Bulging of cervical intervertebral disc   . CAD (coronary artery disease)    lateral STEMI 02/06/2015 00% D1 occlusion treated with Promus Premier 2.5 mm x 16 mm DES, 70% ramus stenosis, 40% mid RCA stenosis, 45% distal RCA stenosis, EF 45-50%  . CHF (congestive heart failure) ([place])   . COPD (chronic obstructive pulmonary disease) ([place])   . Depression   . Diabetes mellitus without complication ([place])   . 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 Dry eye   . Dyspnea   . Early satiety 09/23/2016  . GERD (gastroesophageal reflux disease)   . Headache   . Heart murmur   . Hip pain   . Hyperlipidemia   . Hypertension   . MI (myocardial infarction) ([place])   . Myocardial infarction ([place])   . Neck pain   . Otitis media   . Pleurisy   . Sinus pause    9 sec sinus pause on telemetry after started on coreg after MI, avoid AV nodal blocking agent  . Sleep apnea   .  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 Unilateral primary osteoarthritis, right hip 07/01/2016    Patient Active Problem List   Diagnosis Date Noted  . DM type 2 causing vascular disease ([place]) 08/17/2017  . Noncompliance 07/10/2017  . Syncope 06/29/2017  . HCAP (healthcare-associated pneumonia) 05/15/2017  . Uncontrolled type 2 diabetes mellitus with hyperglycemia, without long-term current use of insulin ([place]) 05/15/2017  . Acute on chronic respiratory failure with hypoxia ([place]) 05/15/2017  . Lobar pneumonia ([place]) 05/15/2017  . Chronic combined systolic and diastolic CHF (congestive heart failure) ([place]) 05/15/2017  . Sinus pause 05/15/2017  . Syncope and collapse 05/15/2017  . Bradycardia 04/28/2017  . Sleep apnea 04/28/2017  . Non-ST elevation (NSTEMI) myocardial infarction ([place]) 04/27/2017  . NSTEMI (non-ST elevated myocardial infarction) ([place]) 04/26/2017  . Substance abuse ([place]) 04/15/2017  . Depression with  anxiety 04/15/2017  . GERD (gastroesophageal reflux disease) 09/23/2016  . 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 Chronic obstructive pulmonary disease ([place]) 08/27/2015  . Cigarette nicotine dependence with nicotine-induced disorder 08/27/2015  . Hyperlipidemia 08/27/2015  . Class 2 severe obesity due to excess calories with serious comorbidity and body mass index (BMI) of 36.0 to 36.9 in adult [facility]) 08/27/2015  . Acute on chronic systolic heart failure ([place]) 06/02/2015  . ACE inhibitor-aggravated angioedema   . Chronic systolic CHF (congestive heart failure) ([place]) 06/01/2015  . CAD (coronary artery disease) 05/31/2015  . STEMI (ST elevation myocardial infarction) ([place]) 05/31/2015  . Tobacco use disorder 05/31/2015  . Essential hypertension 02/09/2015    Past Surgical History:  Procedure Laterality Date  . BIOPSY  10/09/2016   Procedure: BIOPSY;  Surgeon: Daneil Dolin, MD;  [facility];  Service: Endoscopy;;  . CARDIAC CATHETERIZATION N/A 02/06/2015   Procedure: Left Heart Cath and Coronary Angiography;  Surgeon: Leonie Man, MD;  Location: [place] [facility];  Service: Cardiovascular;  Laterality: N/A;  . CARDIAC CATHETERIZATION N/A 02/06/2015   Procedure: Coronary Stent Intervention;  Surgeon: Leonie Man, MD;  Location: [place] [facility] LAB;  Service: Cardiovascular;  Laterality: N/A;  . COLONOSCOPY WITH PROPOFOL N/A 10/09/2016   Procedure: COLONOSCOPY WITH PROPOFOL;  Surgeon: Daneil Dolin, MD;  [facility];  Service: Endoscopy;  Laterality: N/A;  12:00 pm  . COLONOSCOPY WITH PROPOFOL N/A 11/24/2016   Procedure: COLONOSCOPY WITH PROPOFOL;  Surgeon: Daneil Dolin, MD;  [facility];  Service: Endoscopy;  Laterality: N/A;  200  . CORONARY ANGIOPLASTY WITH STENT PLACEMENT  01/2015  . CORONARY STENT INTERVENTION N/A 04/27/2017   Procedure: CORONARY STENT INTERVENTION;  Surgeon: Nelva Bush, MD;  Location: [place] [facility];   Service: Cardiovascular;  Laterality: N/A;  . ELECTROPHYSIOLOGY STUDY N/A 06/29/2017   Procedure: ELECTROPHYSIOLOGY STUDY;  Surgeon: Evans Lance, MD;  Location: [place] [facility];  Service: Cardiovascular;  Laterality: N/A;  . ESOPHAGOGASTRODUODENOSCOPY (EGD) WITH PROPOFOL N/A 10/09/2016   Procedure: ESOPHAGOGASTRODUODENOSCOPY (EGD) WITH PROPOFOL;  Surgeon: Daneil Dolin, MD;  [facility];  Service: Endoscopy;  Laterality: N/A;  . INCISION / DRAINAGE HAND / FINGER    . LEFT HEART CATH AND CORONARY ANGIOGRAPHY N/A 04/27/2017   Procedure: LEFT HEART CATH AND CORONARY ANGIOGRAPHY;  Surgeon: Nelva Bush, MD;  Location: [place] [facility];  Service: Cardiovascular;  Laterality: N/A;  . LOOP RECORDER INSERTION  06/29/2017   Procedure: Loop Recorder Insertion;  Surgeon: Evans Lance, MD;  Location: [place] [facility];  Service: Cardiovascular;;  . POLYPECTOMY  11/24/2016   Procedure: POLYPECTOMY;  Surgeon: Daneil Dolin, MD;  [facility];  Service: Endoscopy;;  descending and sigmoid  . TOTAL HIP ARTHROPLASTY Right 07/01/2016  . TOTAL HIP ARTHROPLASTY Right 07/01/2016   Procedure: RIGHT TOTAL HIP ARTHROPLASTY ANTERIOR APPROACH;  Surgeon: Mcarthur Rossetti, MD;  Location: [place];  Service: Orthopedics;  Laterality: Right;       Home Medications    Prior to Admission medications   Medication Sig Start Date End Date Taking? Authorizing Provider  acetaminophen (TYLENOL) 325 MG tablet Take 650 mg by mouth every 6 (six) hours as needed for mild pain.    [provider]  albuterol (PROVENTIL) (2.5 MG/3ML) 0.083% nebulizer solution Take 3 mLs (2.5 mg total) by nebulization every 6 (six) hours as needed for wheezing or shortness of breath. 05/27/16   Soyla Dryer, PA-C  aspirin 81 MG EC tablet Take 1 tablet (81 mg total) by mouth daily. 04/29/17   Cheryln Manly, NP  atorvastatin (LIPITOR) 80 MG tablet Take 80 mg by mouth daily.    [provider]  Carboxymeth-Glycerin-Polysorb (REFRESH OPTIVE ADVANCED) 0.5-1-0.5 % SOLN Apply 1 drop to eye 2 (two) times daily as needed (dry eyes).     [provider]  citalopram (CELEXA) 20 MG tablet Take 20 mg daily by mouth. 07/14/17 11/10/17  [provider]  cycloSPORINE (RESTASIS) 0.05 % ophthalmic emulsion Place 1 drop into both eyes 2 (two) times daily.    [provider]  fluticasone (FLONASE) 50 MCG/ACT nasal spray Place 2 sprays into both nostrils daily. 09/18/17   Raylene Everts, MD  furosemide (LASIX) 40 MG tablet Take 1 tablet (40 mg total) by mouth daily. 07/03/17   Raylene Everts, MD  glucose blood (IGLUCOSE TEST STRIPS) test strip 1 each by Other route 2 (two) times daily. Use as instructed 07/31/17   Raylene Everts, MD  Magnesium 200 MG TABS Take 1 tablet (200 mg total) by mouth daily. 03/06/17   Lendon Colonel, NP  metFORMIN (GLUCOPHAGE) 1000 MG tablet Take 1 tablet (1,000 mg total) by mouth 2 (two) times daily with a meal. 04/15/17   Raylene Everts, MD  mometasone-formoterol [facility]) 100-5 MCG/ACT AERO Inhale 2 puffs into the lungs 2 (two) times daily. 06/17/17   Raylene Everts, MD  nicotine (NICODERM CQ - DOSED IN MG/24 HOURS) 21 mg/24hr patch Place 1 patch (21 mg total) onto the skin daily. Patient taking differently: Place 21 mg onto the skin daily.  04/15/17   Raylene Everts, MD  nitroGLYCERIN (NITROSTAT) 0.4 MG SL tablet Place 1 tablet (0.4 mg total) under the tongue every 5 (five) minutes as needed for chest pain. 10/21/17   Raylene Everts, MD  Omega-3 Fatty Acids (FISH OIL) 1200 MG CAPS Take 1,200 mg by mouth 2 (two) times daily.    [provider]  pantoprazole (PROTONIX) 40 MG tablet Take 1 tablet (40 mg total) by mouth daily. 10/13/16   Rourk, Cristopher Estimable, MD  potassium chloride SA (K-DUR,KLOR-CON) 20 MEQ tablet Take 1 tablet (20 mEq total) by mouth daily. 07/02/17   Raylene Everts, MD  prasugrel (EFFIENT)  10 MG TABS tablet TAKE 1 TABLET BY MOUTH ONCE A DAY. 10/08/17   Evans Lance, MD  predniSONE (DELTASONE) 20 MG tablet Take 2 po QD x 3d then 1 po QD x 3d 10/24/17   Rolland Porter, MD  PROVENTIL HFA 108 [phone]  Base) MCG/ACT inhaler INHALE 2 PUFFS INTO THE LUNGS EVERY 6 HOURS AS NEEDED FOR SHORTNESS OF BREATH OR WHEEZING. 08/13/17   Raylene Everts, MD  traZODone (DESYREL) 100 MG tablet Take 100 mg by mouth at bedtime.     [provider]    Family History Family History  Problem Relation Age of Onset  . Heart attack Father   . Stroke Father   . Arthritis Father   . Heart disease Father   . Cancer Mother        ???  . Arthritis Mother   . Heart disease Brother 54       died in sleep  . Early death Brother   . Diabetes Maternal Uncle   . Alzheimer's disease Maternal Grandmother     Social History Social History   Tobacco Use  . Smoking status: Current Every Day Smoker    Packs/day: 1.00    Years: 46.00    Pack years: 46.00    Types: Cigarettes    Start date: 06/07/1979  . Smokeless tobacco: Former Systems developer    Types: Chew  Substance Use Topics  . Alcohol use: Yes    Alcohol/week: 6.6 - 7.2 oz    Types: 5 - 6 Shots of liquor, 6 Cans of beer per week    Comment: alcoholic, now drinks intermittently  . Drug use: No    Comment: history of drug use- marijuana, cocaine- quit 2014  on disability   Allergies   Carvedilol; Lisinopril; and Amoxicillin   Review of Systems Review of Systems  All other systems reviewed and are negative.    Physical Exam Updated Vital Signs BP 116/79   Pulse 68   Temp 97.9 F (36.6 C) (Oral)   Resp 15   Ht 5\' 6"  (1.676 m) Comment: Simultaneous filing. User may not have seen previous data.  Wt 99.8 kg (220 lb) Comment: Simultaneous filing. User may not have seen previous data.  SpO2 91%   BMI 35.51 kg/m   Vital signs normal    Physical Exam  Constitutional: He is oriented to person, place, and time. He appears well-developed and  well-nourished.  Non-toxic appearance. He does not appear ill. No distress.  HENT:  Head: Normocephalic and atraumatic.  Right Ear: External ear normal.  Left Ear: External ear normal.  Nose: Nose normal. No mucosal edema or rhinorrhea.  Mouth/Throat: Oropharynx is clear and moist and mucous membranes are normal. No dental abscesses or uvula swelling.  Eyes: Conjunctivae and EOM are normal. Pupils are equal, round, and reactive to light.  Neck: Normal range of motion and full passive range of motion without pain. Neck supple.  Patient has some diffuse tenderness of his cervical spine without localization, he is not tender to palpation over the paraspinous muscles or the right trapezius muscle.  Cardiovascular: Normal rate, regular rhythm and normal heart sounds. Exam reveals no gallop and no friction rub.  No murmur heard. Pulmonary/Chest: Effort normal and breath sounds normal. No respiratory distress. He has no wheezes. He has no rhonchi. He has no rales. He exhibits no tenderness and no crepitus.  Abdominal: Soft. Normal appearance and bowel sounds are normal. He exhibits no distension. There is no tenderness. There is no rebound and no guarding.  Musculoskeletal: Normal range of motion. He exhibits no edema or tenderness.  Moves all extremities well.  Patient has no pain with abduction of his right arm.  He has no pain with flexion or extension of his elbow or  with supination or pronation.  His grips are equal bilaterally.  There is no obvious swelling or deformities of his upper extremity. Phalen's test is negative, Tinel's test is positive  Neurological: He is alert and oriented to person, place, and time. He has normal strength. No cranial nerve deficit.  Skin: Skin is warm, dry and intact. No rash noted. No erythema. No pallor.  Psychiatric: He has a normal mood and affect. His speech is normal and behavior is normal. His mood appears not anxious.  Nursing note and vitals  reviewed.    ED Treatments / Results  Labs (all labs ordered are listed, but only abnormal results are displayed) Results for orders placed or performed during the hospital encounter of 09/32/67  Basic metabolic panel  Result Value Ref Range   Sodium 136 135 - 145 mmol/L   Potassium 3.5 3.5 - 5.1 mmol/L   Chloride 99 (L) 101 - 111 mmol/L   CO2 23 22 - 32 mmol/L   Glucose, Bld 106 (H) 65 - 99 mg/dL   BUN 16 6 - 20 mg/dL   Creatinine, Ser 0.92 0.61 - 1.24 mg/dL   Calcium 9.3 8.9 - 10.3 mg/dL   GFR calc non Af Amer >60 >60 mL/min   GFR calc Af Amer >60 >60 mL/min   Anion gap 14 5 - 15  CBC  Result Value Ref Range   WBC 11.0 (H) 4.0 - 10.5 K/uL   RBC 5.03 4.22 - 5.81 MIL/uL   Hemoglobin 14.2 13.0 - 17.0 g/dL   HCT 43.6 39.0 - 52.0 %   MCV 86.7 78.0 - 100.0 fL   MCH 28.2 26.0 - 34.0 pg   MCHC 32.6 30.0 - 36.0 g/dL   RDW 15.5 11.5 - 15.5 %   Platelets 256 150 - 400 K/uL  Troponin I  Result Value Ref Range   Troponin I <0.03 <0.03 ng/mL   Laboratory interpretation all normal except mild leukocytosis    EKG  EKG Interpretation  Date/Time:  Saturday October 24 2017 01:59:03 EST Ventricular Rate:  67 PR Interval:  176 QRS Duration: 154 QT Interval:  465 QTC Calculation: 491 R Axis:   111 Text Interpretation:  Sinus rhythm RBBB and LPFB Probable lateral infarct, old No significant change since last tracing 23 Oct 2017 Confirmed by Rolland Porter [phone]) on 10/24/2017 2:03:04 AM       Radiology Dg Chest 2 View  Result Date: 10/23/2017 CLINICAL DATA:  Right arm pain with tingling and numbness in the thumb. EXAM: CHEST  2 VIEW COMPARISON:  06/04/2017 FINDINGS: Heart is top-normal in size. Mediastinal contours are unremarkable and stable. Loop recording device projects over the left hemithorax. No pneumonic consolidation or CHF. Degenerative changes are stable along the dorsal spine. No effusion or pneumothorax. IMPRESSION: No active cardiopulmonary disease. Electronically  Signed   By: Ashley Royalty M.D.   On: 10/23/2017 21:50   Ct Head Wo Contrast Ct Cervical Spine Wo Contrast  Result Date: 10/24/2017 CLINICAL DATA:  Right-sided arm pain with numbness EXAM: CT HEAD WITHOUT CONTRAST CT CERVICAL SPINE WITHOUT CONTRAST TECHNIQUE: Multidetector CT imaging of the head and cervical spine was performed following the standard protocol without intravenous contrast. Multiplanar CT image reconstructions of the cervical spine were also generated. COMPARISON:  05/14/2017 FINDINGS: CT HEAD FINDINGS Brain: No evidence of acute infarction, hemorrhage, hydrocephalus, extra-axial collection or mass lesion/mass effect. Minimal atrophy and small vessel ischemic changes of the white matter Vascular: No hyperdense vessels. Scattered calcifications at the carotid  siphon Skull: Normal. Negative for fracture or focal lesion. Sinuses/Orbits: Mild mucosal thickening in the ethmoid and sphenoid sinuses. No acute orbital abnormality Other: None CT CERVICAL SPINE FINDINGS Alignment: Reversal of cervical lordosis. 2 mm anterolisthesis of C3 on C4. Facet alignment is within normal limits. Skull base and vertebrae: No acute fracture. No primary bone lesion or focal pathologic process. Soft tissues and spinal canal: No prevertebral fluid or swelling. No visible canal hematoma. Disc levels: Mild degenerative changes at C3-C4 and C4-C5 with moderate degenerative changes at C5-C6 and C6-C7. Multiple level bilateral foraminal narrowing. Posterior disc osteophyte at C5-C6 and C6-C7 results in mild to moderate canal stenosis. Upper chest: Negative. Other: None IMPRESSION: 1. No CT evidence for acute intracranial abnormality. 2. Trace anterolisthesis of C3 on C4 without acute osseous abnormality. Mild to moderate diffuse degenerative changes, most marked at C5-C6 and C6-C7 where there is bilateral foraminal narrowing and posterior disc osteophyte complex contributing to narrowing of the canal. Electronically Signed   By:  Donavan Foil M.D.   On: 10/24/2017 03:54    Procedures Procedures (including critical care time)  Medications Ordered in ED Medications - No data to display   Initial Impression / Assessment and Plan / ED Course  I have reviewed the triage vital signs and the nursing notes.  Pertinent labs & imaging results that were available during my care of the patient were reviewed by me and considered in my medical decision making (see chart for details).     CT of the head was done for remote possibility of basal ganglia stroke, and CT of his cervical spine was done.  Patient has chronic neck pain and this may be the source of his numbness.  He also may have carpal tunnel since the pain awakens him from sleep and goes away quickly.  I reviewed patient's CT results.  He does have some degenerative changes around C5-6 which could explain some of the numbness of his right thumb.  I would like to start him on some steroids, however he has diabetes.  He also has a history of noncompliance..  Going to refer him back to Dr. Hal Neer and Dr. Ninfa Linden and see if they can figure out whether it has his neck causing the numbness or if it is carpal tunnel.  Final Clinical Impressions(s) / ED Diagnoses   Final diagnoses:  Cervical disc disorder at C5-C6 level with radiculopathy  Carpal tunnel syndrome of right wrist    ED Discharge Orders        Ordered    predniSONE (DELTASONE) 20 MG tablet     10/24/17 0442      Plan discharge  Rolland Porter, MD, Barbette Or, MD 10/24/17 [phone]

## 2017-10-24 NOTE — ED Notes (Signed)
Patient transported to CT 

## 2017-10-24 NOTE — Discharge Instructions (Signed)
The numbness could be from a pinched nerve in your neck, but the fact that it occurs at night and goes away quickly, makes it more likely to be carpal tunnel syndrome. Wear the wrist splint. Take the medications as prescribed.  Be very careful with your diabetic diet while on the steroids.  Call Dr Trevor Mace office to have him evaluate you further.

## 2017-10-29 ENCOUNTER — Encounter: Payer: Self-pay | Admitting: Family Medicine

## 2017-10-29 ENCOUNTER — Ambulatory Visit: Payer: Medicaid Other | Admitting: Family Medicine

## 2017-10-29 ENCOUNTER — Other Ambulatory Visit: Payer: Self-pay

## 2017-10-29 VITALS — BP 148/94 | HR 100 | Temp 98.6°F | Resp 24 | Ht 66.0 in | Wt 222.1 lb

## 2017-10-29 DIAGNOSIS — G5601 Carpal tunnel syndrome, right upper limb: Secondary | ICD-10-CM

## 2017-10-29 DIAGNOSIS — M5090 Cervical disc disorder, unspecified, unspecified cervical region: Secondary | ICD-10-CM | POA: Diagnosis not present

## 2017-10-29 NOTE — Patient Instructions (Signed)
See Dr Ninfa Linden for the carpal tunnel syndrome Continue the exercise Congratulations on quitting smoking PLEASE make it work this time!!

## 2017-10-29 NOTE — Progress Notes (Signed)
Chief Complaint  Patient presents with  . Follow-up    ED F/U   Patient is here for emergency room follow-up.  He went to the emergency room for pain in his right shoulder down to his right hand with numbness in his fingers.  He had concerned that he could be having a stroke or heart attack.  He was evaluated with a CAT scan of his head and his neck.  It was felt he probably had carpal tunnel syndrome causing the numbness in his hand.  He is referred back to his orthopedic.  He has known degenerative disc disease in his neck.  He has referred back to his neurosurgeon.  He was not thought to have any cardiac problems.  He subsequently received a phone call from someone in the Medicaid offices telling him that he should have gone to his family practice doctor for this complaint.  Even though his cervical disc disease is a chronic medical condition, with the acute increase in numbness I can understand why he would go to the emergency room especially given his complicated medical history.  Patient Active Problem List   Diagnosis Date Noted  . DM type 2 causing vascular disease ([place]) 08/17/2017  . Noncompliance 07/10/2017  . Syncope 06/29/2017  . HCAP (healthcare-associated pneumonia) 05/15/2017  . Uncontrolled type 2 diabetes mellitus with hyperglycemia, without long-term current use of insulin ([place]) 05/15/2017  . Acute on chronic respiratory failure with hypoxia ([place]) 05/15/2017  . Lobar pneumonia ([place]) 05/15/2017  . Chronic combined systolic and diastolic CHF (congestive heart failure) ([place]) 05/15/2017  . Sinus pause 05/15/2017  . Syncope and collapse 05/15/2017  . Bradycardia 04/28/2017  . Sleep apnea 04/28/2017  . Non-ST elevation (NSTEMI) myocardial infarction ([place]) 04/27/2017  . NSTEMI (non-ST elevated myocardial infarction) ([place]) 04/26/2017  . Substance abuse ([place]) 04/15/2017  . Depression with anxiety 04/15/2017  . GERD (gastroesophageal reflux disease) 09/23/2016  . Status post  total replacement of right hip 07/01/2016  . Chronic obstructive pulmonary disease ([place]) 08/27/2015  . Cigarette nicotine dependence with nicotine-induced disorder 08/27/2015  . Hyperlipidemia 08/27/2015  . Class 2 severe obesity due to excess calories with serious comorbidity and body mass index (BMI) of 36.0 to 36.9 in adult [facility]) 08/27/2015  . Acute on chronic systolic heart failure ([place]) 06/02/2015  . ACE inhibitor-aggravated angioedema   . Chronic systolic CHF (congestive heart failure) ([place]) 06/01/2015  . CAD (coronary artery disease) 05/31/2015  . STEMI (ST elevation myocardial infarction) ([place]) 05/31/2015  . Tobacco use disorder 05/31/2015  . Essential hypertension 02/09/2015    Outpatient Encounter Medications as of 10/29/2017  Medication Sig  . acetaminophen (TYLENOL) 325 MG tablet Take 650 mg by mouth every 6 (six) hours as needed for mild pain.  Marland Kitchen albuterol (PROVENTIL) (2.5 MG/3ML) 0.083% nebulizer solution Take 3 mLs (2.5 mg total) by nebulization every 6 (six) hours as needed for wheezing or shortness of breath.  Marland Kitchen aspirin 81 MG EC tablet Take 1 tablet (81 mg total) by mouth daily.  Marland Kitchen atorvastatin (LIPITOR) 80 MG tablet Take 80 mg by mouth daily.  . Carboxymeth-Glycerin-Polysorb (REFRESH OPTIVE ADVANCED) 0.5-1-0.5 % SOLN Apply 1 drop to eye 2 (two) times daily as needed (dry eyes).   . citalopram (CELEXA) 20 MG tablet Take 20 mg daily by mouth.  . cycloSPORINE (RESTASIS) 0.05 % ophthalmic emulsion Place 1 drop into both eyes 2 (two) times daily.  . fluticasone (FLONASE) 50 MCG/ACT nasal spray Place 2 sprays into both nostrils daily.  Marland Kitchen  furosemide (LASIX) 40 MG tablet Take 1 tablet (40 mg total) by mouth daily.  Marland Kitchen glucose blood (IGLUCOSE TEST STRIPS) test strip 1 each by Other route 2 (two) times daily. Use as instructed  . Magnesium 200 MG TABS Take 1 tablet (200 mg total) by mouth daily.  . metFORMIN (GLUCOPHAGE) 1000 MG tablet Take 1 tablet (1,000 mg total) by mouth 2 (two)  times daily with a meal.  . mometasone-formoterol (DULERA) 100-5 MCG/ACT AERO Inhale 2 puffs into the lungs 2 (two) times daily.  . nicotine (NICODERM CQ - DOSED IN MG/24 HOURS) 21 mg/24hr patch Place 1 patch (21 mg total) onto the skin daily. (Patient taking differently: Place 21 mg onto the skin daily. )  . nitroGLYCERIN (NITROSTAT) 0.4 MG SL tablet Place 1 tablet (0.4 mg total) under the tongue every 5 (five) minutes as needed for chest pain.  . Omega-3 Fatty Acids (FISH OIL) 1200 MG CAPS Take 1,200 mg by mouth 2 (two) times daily.  . pantoprazole (PROTONIX) 40 MG tablet Take 1 tablet (40 mg total) by mouth daily.  . potassium chloride SA (K-DUR,KLOR-CON) 20 MEQ tablet Take 1 tablet (20 mEq total) by mouth daily.  . prasugrel (EFFIENT) 10 MG TABS tablet TAKE 1 TABLET BY MOUTH ONCE A DAY.  Marland Kitchen predniSONE (DELTASONE) 20 MG tablet Take 2 po QD x 3d then 1 po QD x 3d  . PROVENTIL HFA 108 (90 Base) MCG/ACT inhaler INHALE 2 PUFFS INTO THE LUNGS EVERY 6 HOURS AS NEEDED FOR SHORTNESS OF BREATH OR WHEEZING.  . traZODone (DESYREL) 100 MG tablet Take 100 mg by mouth at bedtime.    No facility-administered encounter medications on file as of 10/29/2017.     Allergies  Allergen Reactions  . Carvedilol Other (See Comments)    Sinus pause on telemetry >3 seconds. Longest one 9 sec. No AV nodal agent  . Lisinopril Anaphylaxis, Shortness Of Breath and Swelling    Angioedema, required intubation and mechanical ventilation  . Amoxicillin Nausea And Vomiting    Nausea only - no allergy    Review of Systems  Respiratory: Positive for shortness of breath.        Persists but improving after cardiac rehab  Cardiovascular: Negative for chest pain and leg swelling.  Musculoskeletal: Positive for neck pain and neck stiffness.  Neurological: Positive for numbness. Negative for weakness.  All other systems reviewed and are negative.    BP (!) 148/94 (BP Location: Left Arm, Patient Position: Sitting, Cuff  Size: Normal)   Pulse 100   Temp 98.6 F (37 C) (Temporal)   Resp (!) 24   Ht 5\' 6"  (1.676 m)   Wt 222 lb 1.9 oz (100.8 kg)   SpO2 96%   BMI 35.85 kg/m   Physical Exam  Constitutional: He is oriented to person, place, and time. He appears well-developed and well-nourished. No distress.  HENT:  Head: Normocephalic and atraumatic.  Mouth/Throat: Oropharynx is clear and moist.  Cardiovascular: Normal rate, regular rhythm and normal heart sounds.  Pulmonary/Chest: Effort normal and breath sounds normal. He has no wheezes.  Musculoskeletal:  Phalen's and Tinel's are positive on the right.  No sensory deficit.  No muscular weakness.  Neurological: He is alert and oriented to person, place, and time.  Skin: Skin is warm and dry.  Psychiatric: He has a normal mood and affect. His behavior is normal.    ASSESSMENT/PLAN:  1. Cervical disc disease Chronic problem.  Not acutely symptomatic.  I do not feel  he needs to go back to his neurosurgeon at this time.  2. Carpal tunnel syndrome of right wrist Acute.  Numbness and pain at night.  Will refer back to his orthopedist - Ambulatory referral to Orthopedic Surgery   Patient Instructions  See Dr Ninfa Linden for the carpal tunnel syndrome Continue the exercise Congratulations on quitting smoking PLEASE make it work this time!!     Raylene Everts, MD

## 2017-10-30 ENCOUNTER — Telehealth: Payer: Self-pay | Admitting: *Deleted

## 2017-10-30 ENCOUNTER — Ambulatory Visit (INDEPENDENT_AMBULATORY_CARE_PROVIDER_SITE_OTHER): Payer: Medicaid Other | Admitting: *Deleted

## 2017-10-30 DIAGNOSIS — R55 Syncope and collapse: Secondary | ICD-10-CM

## 2017-10-30 NOTE — Telephone Encounter (Signed)
[name] calling to make sure his transmission was received. I inquired about the most recent symptom episode on 10/05/17- he says he was hauling wood and became exhausted. He sat down and felt presyncopal but did not pass out. The ECG has a lot of artifact, probable ST, no ectopy seen. Randy Hebert aware and appreciative.

## 2017-11-02 NOTE — Progress Notes (Signed)
[facility] Summary Report / Loop Recorder 

## 2017-11-06 DIAGNOSIS — F329 Major depressive disorder, single episode, unspecified: Secondary | ICD-10-CM | POA: Diagnosis not present

## 2017-11-09 ENCOUNTER — Encounter: Payer: Self-pay | Admitting: Family Medicine

## 2017-11-16 ENCOUNTER — Ambulatory Visit: Payer: Medicaid Other | Admitting: "Endocrinology

## 2017-11-16 ENCOUNTER — Encounter: Payer: Medicaid Other | Attending: Family Medicine | Admitting: Nutrition

## 2017-11-16 VITALS — Ht 66.0 in | Wt 219.6 lb

## 2017-11-16 DIAGNOSIS — E118 Type 2 diabetes mellitus with unspecified complications: Secondary | ICD-10-CM

## 2017-11-16 DIAGNOSIS — E1159 Type 2 diabetes mellitus with other circulatory complications: Secondary | ICD-10-CM | POA: Diagnosis not present

## 2017-11-16 DIAGNOSIS — E669 Obesity, unspecified: Secondary | ICD-10-CM

## 2017-11-16 DIAGNOSIS — E1165 Type 2 diabetes mellitus with hyperglycemia: Secondary | ICD-10-CM

## 2017-11-16 DIAGNOSIS — Z713 Dietary counseling and surveillance: Secondary | ICD-10-CM | POA: Insufficient documentation

## 2017-11-16 DIAGNOSIS — Z6835 Body mass index (BMI) 35.0-35.9, adult: Secondary | ICD-10-CM | POA: Insufficient documentation

## 2017-11-16 DIAGNOSIS — IMO0002 Reserved for concepts with insufficient information to code with codable children: Secondary | ICD-10-CM

## 2017-11-16 NOTE — Progress Notes (Signed)
  Medical Nutrition Therapy:  Appt start time: 1500   end time:  1530    Assessment:  Primary concerns today: Diabetes Type 2.  Lost 2 lbs. Still doing cardiac rehab. Still eating higher fat and processed foods. Trying to quit smoking. Working on eating more fresh fruits and vegetables. Drinking more water. A1C down to Last a1C 7.6%.   Meds: Metformin 1000 mg BID    CMP Latest Ref Rng & Units 11/17/2017 10/23/2017 06/25/2017  Glucose 65 - 99 mg/dL 122(H) 106(H) 121  BUN 7 - 25 mg/dL 10 16 10   Creatinine 0.70 - 1.33 mg/dL 0.76 0.92 0.78  Sodium 135 - 146 mmol/L 138 136 136  Potassium 3.5 - 5.3 mmol/L 4.3 3.5 4.3  Chloride 98 - 110 mmol/L 101 99(L) 96(L)  CO2 20 - 32 mmol/L 30 23 33(H)  Calcium 8.6 - 10.3 mg/dL 9.6 9.3 9.4  Total Protein 6.1 - 8.1 g/dL 6.9 - -  Total Bilirubin 0.2 - 1.2 mg/dL 0.8 - -  Alkaline Phos 40 - 115 U/L - - -  AST 10 - 35 U/L 40(H) - -  ALT 9 - 46 U/L 43 - -   Lipid Panel     Component Value Date/Time   CHOL 93 04/27/2017 0505   TRIG 206 (H) 04/27/2017 0505   HDL 18 (L) 04/27/2017 0505   CHOLHDL 5.2 04/27/2017 0505   VLDL 41 (H) 04/27/2017 0505   LDLCALC 34 04/27/2017 0505   Preferred Learning Style:   No preference indicated   Learning Readiness:   Ready  Change in progress   MEDICATIONS: See chart   DIETARY INTAKE:  24-hr recall:  B ( AM): Eggs and baked ham and 2 slices toast, 1 tsp honey, skim milk  12 oz Snk ( AM):  L ( PM): Baked ham on bun with slaw, pear and unsweet tea Snk ( PM):  D ( PM):  Pulled pork on bun with slaw, chips few, Mt Dew 8 oz Snk ( PM):  Beverages: 1 20 oz bottle of water per day. Usual physical activity: ADL.  Estimated energy needs: 1800  calories 200 g carbohydrates 135 g protein 50 g fat  Progress Towards Goal(s):  In progress.   Nutritional Diagnosis:  NB-1.1 Food and nutrition-related knowledge deficit As related to Diabetes.  As evidenced by A1C 7.6%.    Intervention: Nutrition and Diabetes  education provided on My Plate, CHO counting, meal planning, portion sizes, timing of meals, avoiding snacks between meals unless having a low blood sugar, target ranges for A1C and blood sugars, signs/symptoms and treatment of hyper/hypoglycemia, monitoring blood sugars, taking medications as prescribed, benefits of exercising 30 minutes per day and prevention of complications of DM. Goals 1. Cut out soda, tea 2. Cut down on portions 3. Increase fresh fruits and vegetables. 4. Increase 2 bottles of water per day 5. Cut back milk with meals Lose 1-2 lbs per week. Walk 45-60 minutes three days per week.   Teaching Method Utilized:  Visual Auditory Hands on  Handouts given during visit include:  The Plate Method  Meal Plan Card  Diabetes Instructions.   Barriers to learning/adherence to lifestyle change: none  Demonstrated degree of understanding via:  Teach Back   Monitoring/Evaluation:  Dietary intake, exercise, meal planning, SBG, and body weight in 3 month(s).

## 2017-11-16 NOTE — Patient Instructions (Signed)
Goals 1. Cut out soda, tea 2. Cut down on portions 3. Increase fresh fruits and vegetables. 4. Increase 2 bottles of water per day 5. Cut back milk with meals Lose 1-2 lbs per week. Walk 45-60 minutes three days per week.

## 2017-11-18 LAB — COMPLETE METABOLIC PANEL WITH GFR
AG Ratio: 1.6 (calc) (ref 1.0–2.5)
ALT: 43 U/L (ref 9–46)
AST: 40 U/L — AB (ref 10–35)
Albumin: 4.2 g/dL (ref 3.6–5.1)
Alkaline phosphatase (APISO): 96 U/L (ref 40–115)
BUN: 10 mg/dL (ref 7–25)
CALCIUM: 9.6 mg/dL (ref 8.6–10.3)
CO2: 30 mmol/L (ref 20–32)
CREATININE: 0.76 mg/dL (ref 0.70–1.33)
Chloride: 101 mmol/L (ref 98–110)
GFR, EST AFRICAN AMERICAN: 121 mL/min/{1.73_m2} (ref >=60)
GFR, EST NON AFRICAN AMERICAN: 104 mL/min/{1.73_m2} (ref >=60)
Globulin: 2.7 g/dL (calc) (ref 1.9–3.7)
Glucose, Bld: 122 mg/dL — ABNORMAL HIGH (ref 65–99)
POTASSIUM: 4.3 mmol/L (ref 3.5–5.3)
Sodium: 138 mmol/L (ref 135–146)
TOTAL PROTEIN: 6.9 g/dL (ref 6.1–8.1)
Total Bilirubin: 0.8 mg/dL (ref 0.2–1.2)

## 2017-11-18 LAB — T4, FREE: FREE T4: 1.4 ng/dL (ref 0.8–1.8)

## 2017-11-18 LAB — HEMOGLOBIN A1C
Hgb A1c MFr Bld: 6.5 % of total Hgb — ABNORMAL HIGH (ref <5.7)
Mean Plasma Glucose: 140 (calc)
eAG (mmol/L): 7.7 (calc)

## 2017-11-18 LAB — MICROALBUMIN / CREATININE URINE RATIO
CREATININE, URINE: 102 mg/dL (ref 20–320)
MICROALB UR: 0.7 mg/dL
Microalb Creat Ratio: 7 mcg/mg creat (ref <30)

## 2017-11-18 LAB — VITAMIN D 25 HYDROXY (VIT D DEFICIENCY, FRACTURES): VIT D 25 HYDROXY: 22 ng/mL — AB (ref 30–100)

## 2017-11-18 LAB — TSH: TSH: 1.79 mIU/L (ref 0.40–4.50)

## 2017-11-24 ENCOUNTER — Other Ambulatory Visit (INDEPENDENT_AMBULATORY_CARE_PROVIDER_SITE_OTHER): Payer: Self-pay

## 2017-11-24 ENCOUNTER — Encounter: Payer: Self-pay | Admitting: Nutrition

## 2017-11-24 ENCOUNTER — Encounter: Payer: Medicaid Other | Attending: Family Medicine | Admitting: Nutrition

## 2017-11-24 ENCOUNTER — Ambulatory Visit (INDEPENDENT_AMBULATORY_CARE_PROVIDER_SITE_OTHER): Payer: Medicare Other | Admitting: Orthopaedic Surgery

## 2017-11-24 ENCOUNTER — Ambulatory Visit (INDEPENDENT_AMBULATORY_CARE_PROVIDER_SITE_OTHER): Payer: Medicare Other | Admitting: "Endocrinology

## 2017-11-24 ENCOUNTER — Encounter (INDEPENDENT_AMBULATORY_CARE_PROVIDER_SITE_OTHER): Payer: Self-pay | Admitting: Orthopaedic Surgery

## 2017-11-24 ENCOUNTER — Encounter: Payer: Self-pay | Admitting: "Endocrinology

## 2017-11-24 VITALS — Ht 66.0 in | Wt 218.0 lb

## 2017-11-24 VITALS — BP 134/87 | HR 103 | Ht 66.0 in | Wt 218.8 lb

## 2017-11-24 DIAGNOSIS — E782 Mixed hyperlipidemia: Secondary | ICD-10-CM

## 2017-11-24 DIAGNOSIS — G5603 Carpal tunnel syndrome, bilateral upper limbs: Secondary | ICD-10-CM | POA: Diagnosis not present

## 2017-11-24 DIAGNOSIS — E1159 Type 2 diabetes mellitus with other circulatory complications: Secondary | ICD-10-CM

## 2017-11-24 DIAGNOSIS — E669 Obesity, unspecified: Secondary | ICD-10-CM

## 2017-11-24 DIAGNOSIS — IMO0002 Reserved for concepts with insufficient information to code with codable children: Secondary | ICD-10-CM

## 2017-11-24 DIAGNOSIS — F17200 Nicotine dependence, unspecified, uncomplicated: Secondary | ICD-10-CM

## 2017-11-24 DIAGNOSIS — Z713 Dietary counseling and surveillance: Secondary | ICD-10-CM | POA: Diagnosis not present

## 2017-11-24 DIAGNOSIS — Z6836 Body mass index (BMI) 36.0-36.9, adult: Secondary | ICD-10-CM | POA: Diagnosis not present

## 2017-11-24 DIAGNOSIS — E118 Type 2 diabetes mellitus with unspecified complications: Secondary | ICD-10-CM

## 2017-11-24 DIAGNOSIS — E1165 Type 2 diabetes mellitus with hyperglycemia: Secondary | ICD-10-CM

## 2017-11-24 DIAGNOSIS — M79641 Pain in right hand: Secondary | ICD-10-CM

## 2017-11-24 NOTE — Patient Instructions (Signed)

## 2017-11-24 NOTE — Progress Notes (Signed)
Medical Nutrition Therapy:  Appt start time: 1330  end time:  1530    Assessment:  Primary concerns today: Diabetes Type 2. He has reduced his A1C from 7.6% down to 6.5%. Doing much better.   126-130 in am's.  BS 133-165 mg/dl.  Lost 10 lbs in 3-4 months. Still doing cardiac rehab. Changes made: Drinking more water -2 bottles per day. Needs 4-6 per day.. Has cut out sodas. Has cut out eating sweets. Still eating some high fat high salt processed fast foods. Eating more vegetables.  He feels a lot better.   Sees Dr.  Dorris Fetch Endocrinology today. Metformin 300 mg BID.  Still working on stopped smoking. Smoking 1/2 pack Needs to increase high fiber foods.  Lab Results  Component Value Date   HGBA1C 6.5 (H) 11/17/2017   Lipid Panel     Component Value Date/Time   CHOL 93 04/27/2017 0505   TRIG 206 (H) 04/27/2017 0505   HDL 18 (L) 04/27/2017 0505   CHOLHDL 5.2 04/27/2017 0505   VLDL 41 (H) 04/27/2017 0505   LDLCALC 34 04/27/2017 0505   CMP Latest Ref Rng & Units 11/17/2017 10/23/2017 06/25/2017  Glucose 65 - 99 mg/dL 122(H) 106(H) 121  BUN 7 - 25 mg/dL 10 16 10   Creatinine 0.70 - 1.33 mg/dL 0.76 0.92 0.78  Sodium 135 - 146 mmol/L 138 136 136  Potassium 3.5 - 5.3 mmol/L 4.3 3.5 4.3  Chloride 98 - 110 mmol/L 101 99(L) 96(L)  CO2 20 - 32 mmol/L 30 23 33(H)  Calcium 8.6 - 10.3 mg/dL 9.6 9.3 9.4  Total Protein 6.1 - 8.1 g/dL 6.9 - -  Total Bilirubin 0.2 - 1.2 mg/dL 0.8 - -  Alkaline Phos 40 - 115 U/L - - -  AST 10 - 35 U/L 40(H) - -  ALT 9 - 46 U/L 43 - -     Checking BS three times per week. Metformin 1000 mg BID. FBS: 120-140's.   Diet remains higher in fat and sodium and calories. Working on losing weight through cardiac rehab and eating better.  Motivated and engaged to make changes.   Preferred Learning Style:   No preference indicated   Learning Readiness:   Ready  Change in progress   MEDICATIONS: See chart   DIETARY INTAKE:  24-hr recall:  B ( AM): Chicken  sausage, eggs and toast, Soda L ( PM): Captain D[employer]. Snk ( PM):  D ( PM): Chicken, mixed vegetables, water, pear Snk ( PM):  Beverages:Usual physical activity: walking some a few times per week.  Estimated energy needs: 1800  calories 200 g carbohydrates 135 g protein 50 g fat  Progress Towards Goal(s):  In progress.   Nutritional Diagnosis:  NB-1.1 Food and nutrition-related knowledge deficit As related to Diabetes.  As evidenced by A1C 7.6%.    Intervention: Nutrition and Diabetes education provided on My Plate, CHO counting, meal planning, portion sizes, timing of meals, avoiding snacks between meals unless having a low blood sugar, target ranges for A1C and blood sugars, signs/symptoms and treatment of hyper/hypoglycemia, monitoring blood sugars, taking medications as prescribed, benefits of exercising 30 minutes per day and prevention of complications of DM.  Goals 1. Cut out fast foods of fried foods 2, Drink 3-4 bottles of water per day. 3. Walk 1-1/2 miles daily. Stop smoking.    Teaching Method Utilized:  Visual Auditory Hands on  Handouts given during visit include:  The Plate Method  Meal Plan Card  Diabetes Instructions.  Barriers to learning/adherence to lifestyle change: none  Demonstrated degree of understanding via:  Teach Back   Monitoring/Evaluation:  Dietary intake, exercise, meal planning, SBG, and body weight in 3 month(s).

## 2017-11-24 NOTE — Patient Instructions (Addendum)
Goals 1. Cut out fast foods of fried foods 2, Drink 3-4 bottles per day. 3. Walk 1-1/2 miles daily. Stop smoking.

## 2017-11-24 NOTE — Progress Notes (Signed)
Office Visit Note   Patient: Randy Hebert           Date of Birth: Jun 26, 1966           MRN: [id number] Visit Date: 11/24/2017              Requested by: Raylene Everts, MD [phone], [place] [address] PCP: Raylene Everts, MD   Assessment & Plan: Visit Diagnoses:  1. Carpal tunnel syndrome on both sides     Plan: We will obtain EMG nerve conduction studies of the upper extremities to rule out bilateral carpal tunnel.  He will follow-up after the EMG nerve studies to discuss the results and further treatment.  Questions were encouraged and answered at length.  Follow-Up Instructions: Return for after EMG/ [place] studies.   Orders:  No orders of the defined types were placed in this encounter.  No orders of the defined types were placed in this encounter.     Procedures: No procedures performed   Clinical Data: No additional findings.   Subjective: Chief Complaint  Patient presents with  . Right Wrist - Pain    HPI Randy Hebert is a 52 year old male is referred by the ER due to numbness in his right arm.  He was seen in the ER was felt to have either cervical radiculopathy and/or carpal tunnel syndrome on the right.  He has seen Dr. Hal Neer in the past for his cervical spine has been told he had a disc bulge at multiple levels.  He went to the ER due to the fact that he has a significant history of cardiac disease including MI x2 with stent placement.  He felt that he was having a coronary event and went to the ER due to the pain in his right arm that was radiating into the right arm with numbness in the right hand.  He states that his thumbs in both hands stay numb almost all the time.  He does awaken at night 8 and have to shake his hands and early in the morning has to shake his hands due to numbness in his hands.  Most of the right  hand goes numb more often than the left.  He is diabetic had a recent Medrol Dosepak which she will bring in his  glucose levels of the mid 400s.  CT of the head and cervical spine without contrast dated 10/23/2017 showed no CT evidence of acute intracranial abnormality.  C3 on C4 anteriorlisthesis.  Mild to moderate diffuse degenerative changes most notable at C5-C6 and C6-7 with bilateral foraminal narrowing and posterior disc osteophyte complex contributing to narrowing of the canal.  Review of Systems See HPI otherwise negative  Objective: Vital Signs: There were no vitals taken for this visit.  Physical Exam General: Well-developed well-nourished male in no acute distress mood affect appropriate.  Psych alert and oriented x3.  Vascular radial pulses are intact bilaterally. Ortho Exam Has positive compression test over the median nerve bilaterally.  Negative Tinel's bilateral wrist.  Positive Phalen's bilaterally.  Subjective decreased sensation involving the thumb through the fourth fingers.  No rashes skin lesions ulcerations bilateral hands. Specialty Comments:  No specialty comments available.  Imaging: No results found.   PMFS History: Patient Active Problem List   Diagnosis Date Noted  . DM type 2 causing vascular disease ([place]) 08/17/2017  . Noncompliance 07/10/2017  . Syncope 06/29/2017  . HCAP (healthcare-associated pneumonia) 05/15/2017  . Uncontrolled type 2  diabetes mellitus with hyperglycemia, without long-term current use of insulin ([place]) 05/15/2017  . Acute on chronic respiratory failure with hypoxia ([place]) 05/15/2017  . Lobar pneumonia ([place]) 05/15/2017  . Chronic combined systolic and diastolic CHF (congestive heart failure) ([place]) 05/15/2017  . Sinus pause 05/15/2017  . Syncope and collapse 05/15/2017  . Bradycardia 04/28/2017  . Sleep apnea 04/28/2017  . Non-ST elevation (NSTEMI) myocardial infarction ([place]) 04/27/2017  . NSTEMI (non-ST elevated myocardial infarction) ([place]) 04/26/2017  . Substance abuse ([place]) 04/15/2017  . Depression with anxiety 04/15/2017  . GERD  (gastroesophageal reflux disease) 09/23/2016  . 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 Chronic obstructive pulmonary disease ([place]) 08/27/2015  . Cigarette nicotine dependence with nicotine-induced disorder 08/27/2015  . Hyperlipidemia 08/27/2015  . Class 2 severe obesity due to excess calories with serious comorbidity and body mass index (BMI) of 36.0 to 36.9 in adult [facility]) 08/27/2015  . Acute on chronic systolic heart failure ([place]) 06/02/2015  . ACE inhibitor-aggravated angioedema   . Chronic systolic CHF (congestive heart failure) ([place]) 06/01/2015  . CAD (coronary artery disease) 05/31/2015  . STEMI (ST elevation myocardial infarction) ([place]) 05/31/2015  . Tobacco use disorder 05/31/2015  . Essential hypertension 02/09/2015   Past Medical History:  Diagnosis Date  . Allergy   . Anxiety   . Arthritis   . Asthma    hip replacement  . Back pain   . Bulging of cervical intervertebral disc   . CAD (coronary artery disease)    lateral STEMI 02/06/2015 00% D1 occlusion treated with Promus Premier 2.5 mm x 16 mm DES, 70% ramus stenosis, 40% mid RCA stenosis, 45% distal RCA stenosis, EF 45-50%  . CHF (congestive heart failure) ([place])   . COPD (chronic obstructive pulmonary disease) ([place])   . Depression   . Diabetes mellitus without complication ([place])   . 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 Dry eye   . Dyspnea   . Early satiety 09/23/2016  . GERD (gastroesophageal reflux disease)   . Headache   . Heart murmur   . Hip pain   . Hyperlipidemia   . Hypertension   . MI (myocardial infarction) ([place])   . Myocardial infarction ([place])   . Neck pain   . Otitis media   . Pleurisy   . Sinus pause    9 sec sinus pause on telemetry after started on coreg after MI, avoid AV nodal blocking agent  . Sleep apnea   . 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 Unilateral primary osteoarthritis, right hip 07/01/2016    Family History  Problem  Relation Age of Onset  . Heart attack Father   . Stroke Father   . Arthritis Father   . Heart disease Father   . Cancer Mother        ???  . Arthritis Mother   . Heart disease Brother 9       died in sleep  . Early death Brother   . Diabetes Maternal Uncle   . Alzheimer's disease Maternal Grandmother     Past Surgical History:  Procedure Laterality Date  . BIOPSY  10/09/2016   Procedure: BIOPSY;  Surgeon: Daneil Dolin, MD;  [facility];  Service: Endoscopy;;  . CARDIAC CATHETERIZATION N/A 02/06/2015   Procedure: Left Heart Cath and Coronary Angiography;  Surgeon: Leonie Man, MD;  Location: [place] [facility];  Service: Cardiovascular;  Laterality: N/A;  .  CARDIAC CATHETERIZATION N/A 02/06/2015   Procedure: Coronary Stent Intervention;  Surgeon: Leonie Man, MD;  Location: [place] [facility];  Service: Cardiovascular;  Laterality: N/A;  . COLONOSCOPY WITH PROPOFOL N/A 10/09/2016   Procedure: COLONOSCOPY WITH PROPOFOL;  Surgeon: Daneil Dolin, MD;  [facility];  Service: Endoscopy;  Laterality: N/A;  12:00 pm  . COLONOSCOPY WITH PROPOFOL N/A 11/24/2016   Procedure: COLONOSCOPY WITH PROPOFOL;  Surgeon: Daneil Dolin, MD;  [facility];  Service: Endoscopy;  Laterality: N/A;  200  . CORONARY ANGIOPLASTY WITH STENT PLACEMENT  01/2015  . CORONARY STENT INTERVENTION N/A 04/27/2017   Procedure: CORONARY STENT INTERVENTION;  Surgeon: Nelva Bush, MD;  Location: [place] [facility];  Service: Cardiovascular;  Laterality: N/A;  . ELECTROPHYSIOLOGY STUDY N/A 06/29/2017   Procedure: ELECTROPHYSIOLOGY STUDY;  Surgeon: Evans Lance, MD;  Location: [place] [facility];  Service: Cardiovascular;  Laterality: N/A;  . ESOPHAGOGASTRODUODENOSCOPY (EGD) WITH PROPOFOL N/A 10/09/2016   Procedure: ESOPHAGOGASTRODUODENOSCOPY (EGD) WITH PROPOFOL;  Surgeon: Daneil Dolin, MD;  [facility];  Service: Endoscopy;  Laterality: N/A;  . INCISION / DRAINAGE HAND  / FINGER    . LEFT HEART CATH AND CORONARY ANGIOGRAPHY N/A 04/27/2017   Procedure: LEFT HEART CATH AND CORONARY ANGIOGRAPHY;  Surgeon: Nelva Bush, MD;  Location: [place] [facility];  Service: Cardiovascular;  Laterality: N/A;  . LOOP RECORDER INSERTION  06/29/2017   Procedure: Loop Recorder Insertion;  Surgeon: Evans Lance, MD;  Location: [place] [facility];  Service: Cardiovascular;;  . POLYPECTOMY  11/24/2016   Procedure: POLYPECTOMY;  Surgeon: Daneil Dolin, MD;  [facility];  Service: Endoscopy;;  descending and sigmoid  . TOTAL HIP ARTHROPLASTY Right 07/01/2016  . TOTAL HIP ARTHROPLASTY Right 07/01/2016   Procedure: RIGHT TOTAL HIP ARTHROPLASTY ANTERIOR APPROACH;  Surgeon: Mcarthur Rossetti, MD;  Location: [facility];  Service: Orthopedics;  Laterality: Right;   Social History   Occupational History  . Occupation: unemployed    Comment: Nature conservation officer    Comment: disability pending  Tobacco Use  . Smoking status: Current Every Day Smoker    Packs/day: 1.00    Years: 46.00    Pack years: 46.00    Types: Cigarettes    Start date: 06/07/1979  . Smokeless tobacco: Former Systems developer    Types: Chew  Substance and Sexual Activity  . Alcohol use: Yes    Alcohol/week: 6.6 - 7.2 oz    Types: 5 - 6 Shots of liquor, 6 Cans of beer per week    Comment: alcoholic, now drinks intermittently  . Drug use: No    Comment: history of drug use- marijuana, cocaine- quit 2014  . Sexual activity: Yes    Partners: Female    Birth control/protection: None

## 2017-11-24 NOTE — Progress Notes (Signed)
Consult Note       11/24/2017, 5:40 PM   Subjective:    Patient ID: Randy Hebert, male    DOB: 12-08-1965.  Randy Hebert is being seen in consultation for management of currently uncontrolled symptomatic diabetes requested by  Raylene Everts, MD.   Past Medical History:  Diagnosis Date  . Allergy   . Anxiety   . Arthritis   . Asthma    hip replacement  . Back pain   . Bulging of cervical intervertebral disc   . CAD (coronary artery disease)    lateral STEMI 02/06/2015 00% D1 occlusion treated with Promus Premier 2.5 mm x 16 mm DES, 70% ramus stenosis, 40% mid RCA stenosis, 45% distal RCA stenosis, EF 45-50%  . CHF (congestive heart failure) ([place])   . COPD (chronic obstructive pulmonary disease) ([place])   . Depression   . Diabetes mellitus without complication ([place])   . 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 Dry eye   . Dyspnea   . Early satiety 09/23/2016  . GERD (gastroesophageal reflux disease)   . Headache   . Heart murmur   . Hip pain   . Hyperlipidemia   . Hypertension   . MI (myocardial infarction) ([place])   . Myocardial infarction ([place])   . Neck pain   . Otitis media   . Pleurisy   . Sinus pause    9 sec sinus pause on telemetry after started on coreg after MI, avoid AV nodal blocking agent  . Sleep apnea   . 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 Unilateral primary osteoarthritis, right hip 07/01/2016   Past Surgical History:  Procedure Laterality Date  . BIOPSY  10/09/2016   Procedure: BIOPSY;  Surgeon: Daneil Dolin, MD;  [facility];  Service: Endoscopy;;  . CARDIAC CATHETERIZATION N/A 02/06/2015   Procedure: Left Heart Cath and Coronary Angiography;  Surgeon: Leonie Man, MD;  Location: [facility];  Service: Cardiovascular;  Laterality: N/A;  . CARDIAC CATHETERIZATION N/A 02/06/2015   Procedure: Coronary  Stent Intervention;  Surgeon: Leonie Man, MD;  Location: [facility];  Service: Cardiovascular;  Laterality: N/A;  . COLONOSCOPY WITH PROPOFOL N/A 10/09/2016   Procedure: COLONOSCOPY WITH PROPOFOL;  Surgeon: Daneil Dolin, MD;  [facility];  Service: Endoscopy;  Laterality: N/A;  12:00 pm  . COLONOSCOPY WITH PROPOFOL N/A 11/24/2016   Procedure: COLONOSCOPY WITH PROPOFOL;  Surgeon: Daneil Dolin, MD;  [facility];  Service: Endoscopy;  Laterality: N/A;  200  . CORONARY ANGIOPLASTY WITH STENT PLACEMENT  01/2015  . CORONARY STENT INTERVENTION N/A 04/27/2017   Procedure: CORONARY STENT INTERVENTION;  Surgeon: Nelva Bush, MD;  Location: [facility];  Service: Cardiovascular;  Laterality: N/A;  . ELECTROPHYSIOLOGY STUDY N/A 06/29/2017   Procedure: ELECTROPHYSIOLOGY STUDY;  Surgeon: Evans Lance, MD;  Location: [facility];  Service: Cardiovascular;  Laterality: N/A;  . ESOPHAGOGASTRODUODENOSCOPY (EGD) WITH PROPOFOL N/A 10/09/2016   Procedure: ESOPHAGOGASTRODUODENOSCOPY (EGD) WITH PROPOFOL;  Surgeon: Cristopher Estimable  Rourk, MD;  [facility];  Service: Endoscopy;  Laterality: N/A;  . INCISION / DRAINAGE HAND / FINGER    . LEFT HEART CATH AND CORONARY ANGIOGRAPHY N/A 04/27/2017   Procedure: LEFT HEART CATH AND CORONARY ANGIOGRAPHY;  Surgeon: Nelva Bush, MD;  Location: [facility];  Service: Cardiovascular;  Laterality: N/A;  . LOOP RECORDER INSERTION  06/29/2017   Procedure: Loop Recorder Insertion;  Surgeon: Evans Lance, MD;  Location: [facility];  Service: Cardiovascular;;  . POLYPECTOMY  11/24/2016   Procedure: POLYPECTOMY;  Surgeon: Daneil Dolin, MD;  [facility];  Service: Endoscopy;;  descending and sigmoid  . TOTAL HIP ARTHROPLASTY Right 07/01/2016  . TOTAL HIP ARTHROPLASTY Right 07/01/2016   Procedure: RIGHT TOTAL HIP ARTHROPLASTY ANTERIOR APPROACH;  Surgeon: Mcarthur Rossetti, MD;  Location: [facility];  Service: Orthopedics;  Laterality: Right;   Social History   Socioeconomic History  . Marital status: Significant Other    Spouse name: alice  . Number of children: 3  . Years of education: 25  . Highest education level: Not on file  Occupational History  . Occupation: unemployed    Comment: Nature conservation officer    Comment: disability pending  Social Needs  . Financial resource strain: Not on file  . Food insecurity:    Worry: Not on file    Inability: Not on file  . Transportation needs:    Medical: Not on file    Non-medical: Not on file  Tobacco Use  . Smoking status: Current Every Day Smoker    Packs/day: 1.00    Years: 46.00    Pack years: 46.00    Types: Cigarettes    Start date: 06/07/1979  . Smokeless tobacco: Former Systems developer    Types: Chew  Substance and Sexual Activity  . Alcohol use: Yes    Alcohol/week: 6.6 - 7.2 oz    Types: 5 - 6 Shots of liquor, 6 Cans of beer per week    Comment: alcoholic, now drinks intermittently  . Drug use: No    Comment: history of drug use- marijuana, cocaine- quit 2014  . Sexual activity: Yes    Partners: Female    Birth control/protection: None  Lifestyle  . Physical activity:    Days per week: Not on file    Minutes per session: Not on file  . Stress: Not on file  Relationships  . Social connections:    Talks on phone: Not on file    Gets together: Not on file    Attends religious service: Not on file    Active member of club or organization: Not on file    Attends meetings of clubs or organizations: Not on file    Relationship status: Not on file  Other Topics Concern  . Not on file  Social History Narrative   Lives with girlfriend [name]   Leisure - TV   Outpatient Encounter Medications as of 11/24/2017  Medication Sig  . acetaminophen (TYLENOL) 325 MG tablet Take 650 mg by mouth every 6 (six) hours as needed for mild pain.  Marland Kitchen albuterol (PROVENTIL) (2.5 MG/3ML) 0.083% nebulizer solution Take 3 mLs (2.5 mg total)  by nebulization every 6 (six) hours as needed for wheezing or shortness of breath.  Marland Kitchen aspirin 81 MG EC tablet Take 1 tablet (81 mg total) by mouth daily.  Marland Kitchen atorvastatin (LIPITOR) 80 MG tablet Take 80 mg by mouth daily.  . Carboxymeth-Glycerin-Polysorb (REFRESH OPTIVE ADVANCED) 0.5-1-0.5 %  SOLN Apply 1 drop to eye 2 (two) times daily as needed (dry eyes).   . cycloSPORINE (RESTASIS) 0.05 % ophthalmic emulsion Place 1 drop into both eyes 2 (two) times daily.  . fluticasone (FLONASE) 50 MCG/ACT nasal spray Place 2 sprays into both nostrils daily.  . furosemide (LASIX) 40 MG tablet Take 1 tablet (40 mg total) by mouth daily.  Marland Kitchen glucose blood (IGLUCOSE TEST STRIPS) test strip 1 each by Other route 2 (two) times daily. Use as instructed  . Magnesium 200 MG TABS Take 1 tablet (200 mg total) by mouth daily.  . metFORMIN (GLUCOPHAGE) 1000 MG tablet Take 1 tablet (1,000 mg total) by mouth 2 (two) times daily with a meal.  . mometasone-formoterol (DULERA) 100-5 MCG/ACT AERO Inhale 2 puffs into the lungs 2 (two) times daily.  . nicotine (NICODERM CQ - DOSED IN MG/24 HOURS) 21 mg/24hr patch Place 1 patch (21 mg total) onto the skin daily. (Patient taking differently: Place 21 mg onto the skin daily. )  . nitroGLYCERIN (NITROSTAT) 0.4 MG SL tablet Place 1 tablet (0.4 mg total) under the tongue every 5 (five) minutes as needed for chest pain.  . Omega-3 Fatty Acids (FISH OIL) 1200 MG CAPS Take 1,200 mg by mouth 2 (two) times daily.  . pantoprazole (PROTONIX) 40 MG tablet Take 1 tablet (40 mg total) by mouth daily.  . potassium chloride SA (K-DUR,KLOR-CON) 20 MEQ tablet Take 1 tablet (20 mEq total) by mouth daily.  . prasugrel (EFFIENT) 10 MG TABS tablet TAKE 1 TABLET BY MOUTH ONCE A DAY.  Marland Kitchen predniSONE (DELTASONE) 20 MG tablet Take 2 po QD x 3d then 1 po QD x 3d  . PROVENTIL HFA 108 (90 Base) MCG/ACT inhaler INHALE 2 PUFFS INTO THE LUNGS EVERY 6 HOURS AS NEEDED FOR SHORTNESS OF BREATH OR WHEEZING.  . traZODone  (DESYREL) 100 MG tablet Take 100 mg by mouth at bedtime.    No facility-administered encounter medications on file as of 11/24/2017.     ALLERGIES: Allergies  Allergen Reactions  . Carvedilol Other (See Comments)    Sinus pause on telemetry >3 seconds. Longest one 9 sec. No AV nodal agent  . Lisinopril Anaphylaxis, Shortness Of Breath and Swelling    Angioedema, required intubation and mechanical ventilation  . Amoxicillin Nausea And Vomiting    Nausea only - no allergy    VACCINATION STATUS: Immunization History  Administered Date(s) Administered  . Influenza,inj,Quad PF,6+ Mos 07/03/2016, 05/07/2017  . Pneumococcal Conjugate-13 06/17/2017  . Pneumococcal Polysaccharide-23 02/07/2015    Diabetes  He presents for his follow-up diabetic visit. He has type 2 diabetes mellitus. Onset time: He was diagnosed at approximate age of 19 years. His disease course has been improving. There are no hypoglycemic associated symptoms. Pertinent negatives for hypoglycemia include no confusion, headaches, pallor or seizures. Pertinent negatives for diabetes include no chest pain, no fatigue, no polydipsia, no polyphagia, no polyuria and no weakness. There are no hypoglycemic complications. Symptoms are improving. Diabetic complications include heart disease. Risk factors for coronary artery disease include dyslipidemia, diabetes mellitus, hypertension, male sex, family history, obesity, sedentary lifestyle and tobacco exposure. Current diabetic treatment includes oral agent (monotherapy). His weight is decreasing steadily. He is following a generally unhealthy diet. When asked about meal planning, he reported none. He has not had a previous visit with a dietitian. He never participates in exercise. His overall blood glucose range is 140-180 mg/dl. An ACE inhibitor/angiotensin II receptor blocker is contraindicated (He has documented allergy for  ACE inhibitor's). He does not see a podiatrist.Eye exam is not  current.  Hyperlipidemia  This is a chronic problem. The current episode started more than 1 year ago. Exacerbating diseases include diabetes and obesity. Pertinent negatives include no chest pain, myalgias or shortness of breath. Current antihyperlipidemic treatment includes statins. Risk factors for coronary artery disease include dyslipidemia, diabetes mellitus, family history, obesity, male sex, hypertension and a sedentary lifestyle.    Review of Systems  Constitutional: Negative for chills, fatigue, fever and unexpected weight change.  HENT: Negative for dental problem, mouth sores and trouble swallowing.   Eyes: Negative for visual disturbance.  Respiratory: Negative for cough, choking, chest tightness, shortness of breath and wheezing.   Cardiovascular: Negative for chest pain, palpitations and leg swelling.  Gastrointestinal: Negative for abdominal distention, abdominal pain, constipation, diarrhea, nausea and vomiting.  Endocrine: Negative for polydipsia, polyphagia and polyuria.  Genitourinary: Negative for dysuria, flank pain, hematuria and urgency.  Musculoskeletal: Negative for back pain, gait problem, myalgias and neck pain.  Skin: Negative for pallor, rash and wound.  Neurological: Negative for seizures, syncope, weakness, numbness and headaches.  Psychiatric/Behavioral: Negative.  Negative for confusion and dysphoric mood.    Objective:    BP 134/87   Pulse (!) 103   Ht 5\' 6"  (1.676 m)   Wt 218 lb 12.8 oz (99.2 kg)   BMI 35.32 kg/m   Wt Readings from Last 3 Encounters:  11/24/17 218 lb 12.8 oz (99.2 kg)  11/24/17 218 lb (98.9 kg)  11/16/17 219 lb 9.6 oz (99.6 kg)     Physical Exam  Constitutional: He is oriented to person, place, and time. He appears well-developed. He is cooperative. No distress.  HENT:  Head: Normocephalic and atraumatic.  Eyes: EOM are normal.  Neck: Normal range of motion. Neck supple. No tracheal deviation present. No thyromegaly present.   Cardiovascular: Normal rate, S1 normal and S2 normal. Exam reveals no gallop.  No murmur heard. Pulses:      Dorsalis pedis pulses are 1+ on the right side, and 1+ on the left side.       Posterior tibial pulses are 1+ on the right side, and 1+ on the left side.  Pulmonary/Chest: Effort normal. No respiratory distress. He has no wheezes.  Abdominal: He exhibits no distension. There is no tenderness. There is no guarding and no CVA tenderness.  Musculoskeletal: He exhibits no edema.       Right shoulder: He exhibits no swelling and no deformity.  Neurological: He is alert and oriented to person, place, and time. He has normal strength and normal reflexes. No cranial nerve deficit or sensory deficit. Gait normal.  Skin: Skin is warm and dry. No rash noted. No cyanosis. Nails show no clubbing.  Psychiatric: He has a normal mood and affect. His speech is normal. Cognition and memory are normal.    CMP     Component Value Date/Time   NA 138 11/17/2017 0709   K 4.3 11/17/2017 0709   CL 101 11/17/2017 0709   CO2 30 11/17/2017 0709   GLUCOSE 122 (H) 11/17/2017 0709   BUN 10 11/17/2017 0709   CREATININE 0.76 11/17/2017 0709   CALCIUM 9.6 11/17/2017 0709   PROT 6.9 11/17/2017 0709   ALBUMIN 3.7 04/15/2017 0927   AST 40 (H) 11/17/2017 0709   ALT 43 11/17/2017 0709   ALKPHOS 98 04/15/2017 0927   BILITOT 0.8 11/17/2017 0709   GFRNONAA 104 11/17/2017 0709   GFRAA 121 11/17/2017 0709  Diabetic Labs (most recent): Lab Results  Component Value Date   HGBA1C 6.5 (H) 11/17/2017   HGBA1C 7.6 (H) 04/15/2017   HGBA1C 6.8 (H) 11/11/2016     Lipid Panel ( most recent) Lipid Panel     Component Value Date/Time   CHOL 93 04/27/2017 0505   TRIG 206 (H) 04/27/2017 0505   HDL 18 (L) 04/27/2017 0505   CHOLHDL 5.2 04/27/2017 0505   VLDL 41 (H) 04/27/2017 0505   LDLCALC 34 04/27/2017 0505      Lab Results  Component Value Date   TSH 1.79 11/17/2017   TSH 1.635 04/26/2017   TSH 2.564  06/27/2015   FREET4 1.4 11/17/2017      Assessment & Plan:   1. DM type 2 causing vascular disease ([place])  - Randy Hebert has currently uncontrolled symptomatic type 2 DM since  52 years of age. -He continues to improve his glucose profile to near target.  He returns with A1c of 6.5% improving from 7.6%.  -his diabetes is complicated by  coronary artery disease which required stent placement, obesity/sedentary life, chronic heavy smoking, and YAMAN GRAUBERGER remains at a high risk for more acute and chronic complications which include CAD, CVA, CKD, retinopathy, and neuropathy. These are all discussed in detail with the patient.  - I have counseled him on diet management and weight loss, by adopting a carbohydrate restricted/protein rich diet.  -  Suggestion is made for him to avoid simple carbohydrates  from his diet including Cakes, Sweet Desserts / Pastries, Ice Cream, Soda (diet and regular), Sweet Tea, Candies, Chips, Cookies, Store Bought Juices, Alcohol in Excess of  1-2 drinks a day, Artificial Sweeteners, and "Sugar-free" Products. This will help patient to have stable blood glucose profile and potentially avoid unintended weight gain.   - I encouraged him to switch to  unprocessed or minimally processed complex starch and increased protein intake (animal or plant source), fruits, and vegetables.  - he is advised to stick to a routine mealtimes to eat 3 meals  a day and avoid unnecessary snacks ( to snack only to correct hypoglycemia).    - I have approached him with the following individualized plan to manage diabetes and patient agrees:   - He seems to be motivated.  -He has achieved control of diabetes without insulin.   -Emphasizing the need for him to stay engaged, I will continue metformin 1000 mg p.o. twice daily, therapeutically suitable for patient .  - he has choices if he requires additional treatment, he will be considered for incretin therapy as appropriate  next visit. - Patient specific target  A1c;  LDL, HDL, Triglycerides, and  Waist Circumference were discussed in detail.  2) BP/HTN: His blood pressure is controlled to target.  Patient has documented allergy to ACE inhibitor's . He is on diuretics, he would be considered for calcium channel blockers during his next visit if his BP  remains above target.  3) Lipids/HPL:   Controlled, LDL 34, triglycerides above target at 206.Marland Kitchen   Patient is advised to continue statins.  Better control of diabetes would likely lead to better control of triglycerides.  He will have fasting lipid panel in August 2019.  4)  Weight/Diet: CDE Consult will be initiated , exercise, and detailed carbohydrates information provided.  5) Chronic Care/Health Maintenance:  -he  is on Statin medications and  is encouraged to continue to follow up with Ophthalmology, Dentist,  Podiatrist at least yearly or according to recommendations,  and advised to  Quit smoking ( is a chronic heavy smoker for the last 40 days). I have recommended yearly flu vaccine and pneumonia vaccination at least every 5 years; moderate intensity exercise for up to 150 minutes weekly; and  sleep for at least 7 hours a day.  - I advised patient to maintain close follow up with [facility] Jennette Banker, MD for primary care needs. - Time spent with the patient: 25 min, of which >50% was spent in reviewing his blood glucose logs , discussing his hypo- and hyper-glycemic episodes, reviewing his current and  previous labs and insulin doses and developing a plan to avoid hypo- and hyper-glycemia. Please refer to Patient Instructions for Blood Glucose Monitoring and Insulin/Medications Dosing Guide"  in media tab for additional information. Randy Hebert participated in the discussions, expressed understanding, and voiced agreement with the above plans.  All questions were answered to his satisfaction. he is encouraged to contact clinic should he have any questions or  concerns prior to his return visit.  Follow up plan: - Return in about 4 months (around 03/26/2018) for follow up with pre-visit labs.  Glade Lloyd, MD [facility] [address] [place], [place] [address] Phone: [phone]  Fax: [phone]    11/24/2017, 5:40 PM  This note was partially dictated with voice recognition software. Similar sounding words can be transcribed inadequately or may not  be corrected upon review.

## 2017-11-25 ENCOUNTER — Encounter: Payer: Self-pay | Admitting: Nutrition

## 2017-11-27 ENCOUNTER — Encounter: Payer: Self-pay | Admitting: Family Medicine

## 2017-11-27 ENCOUNTER — Ambulatory Visit (INDEPENDENT_AMBULATORY_CARE_PROVIDER_SITE_OTHER): Payer: Medicare Other | Admitting: Family Medicine

## 2017-11-27 ENCOUNTER — Other Ambulatory Visit: Payer: Self-pay

## 2017-11-27 VITALS — BP 136/70 | HR 101 | Temp 97.8°F | Resp 16 | Ht 66.0 in | Wt 217.0 lb

## 2017-11-27 DIAGNOSIS — G5601 Carpal tunnel syndrome, right upper limb: Secondary | ICD-10-CM | POA: Diagnosis not present

## 2017-11-27 DIAGNOSIS — F17200 Nicotine dependence, unspecified, uncomplicated: Secondary | ICD-10-CM

## 2017-11-27 DIAGNOSIS — I10 Essential (primary) hypertension: Secondary | ICD-10-CM

## 2017-11-27 DIAGNOSIS — M5090 Cervical disc disorder, unspecified, unspecified cervical region: Secondary | ICD-10-CM | POA: Diagnosis not present

## 2017-11-27 DIAGNOSIS — I25110 Atherosclerotic heart disease of native coronary artery with unstable angina pectoris: Secondary | ICD-10-CM | POA: Diagnosis not present

## 2017-11-27 NOTE — Patient Instructions (Addendum)
Diabetes is controlled Blood pressure is good STOP SMOKING Need for follow up the hand numbness - repair is simple  No change in medicines Walk every day that you are able  Make sure you have a PCP follow up within 3 momths We have list

## 2017-11-27 NOTE — Progress Notes (Signed)
Chief Complaint  Patient presents with  . Follow-up    3 mth   Patient is here for routine 58-month appointment He has no acute complaints today He is under the care of cardiology for his coronary artery disease.  He is  and cardiac rehab with loss of weight, improvement of his stamina, improvement of his shortness of breath and no chest pain.  He is pleased with his improvement He states that he continues to smoke cigarettes.  He is currently smoking 1/2 pack a day.  I explained to him, again, that it is absolutely essential he quit smoking in order not to have advancement of his heart disease and risk another heart attack, morbidity, and mortality.  He states "stress" is the reason he feels unable to quit.  He does have patches.  Declines referral. Diabetes is well controlled.  He has no diabetes complications.  He had an eye exam done in the fall.  His last hemoglobin A1c is 6.5.  He has no numbness or problems with his feet.  His creatinine has consistently been normal. He has had problems with his neck, limited range of motion his neck, and numbness in his hands.  He went to an orthopedic.  He was diagnosed with bilateral carpal tunnel syndrome.  Nerve conduction studies are pending.  Discussed conservative management, and surgical management of carpal tunnel syndrome.    Patient Active Problem List   Diagnosis Date Noted  . DM type 2 causing vascular disease ([place]) 08/17/2017  . Noncompliance 07/10/2017  . Syncope 06/29/2017  . HCAP (healthcare-associated pneumonia) 05/15/2017  . Uncontrolled type 2 diabetes mellitus with hyperglycemia, without long-term current use of insulin ([place]) 05/15/2017  . Acute on chronic respiratory failure with hypoxia ([place]) 05/15/2017  . Lobar pneumonia ([place]) 05/15/2017  . Chronic combined systolic and diastolic CHF (congestive heart failure) ([place]) 05/15/2017  . Sinus pause 05/15/2017  . Syncope and collapse 05/15/2017  . Bradycardia 04/28/2017  . Sleep  apnea 04/28/2017  . Non-ST elevation (NSTEMI) myocardial infarction ([place]) 04/27/2017  . NSTEMI (non-ST elevated myocardial infarction) ([place]) 04/26/2017  . Substance abuse ([place]) 04/15/2017  . Depression with anxiety 04/15/2017  . GERD (gastroesophageal reflux disease) 09/23/2016  . 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 Chronic obstructive pulmonary disease ([place]) 08/27/2015  . Cigarette nicotine dependence with nicotine-induced disorder 08/27/2015  . Hyperlipidemia 08/27/2015  . Class 2 severe obesity due to excess calories with serious comorbidity and body mass index (BMI) of 36.0 to 36.9 in adult [facility]) 08/27/2015  . Acute on chronic systolic heart failure ([place]) 06/02/2015  . ACE inhibitor-aggravated angioedema   . Chronic systolic CHF (congestive heart failure) ([place]) 06/01/2015  . CAD (coronary artery disease) 05/31/2015  . STEMI (ST elevation myocardial infarction) ([place]) 05/31/2015  . Tobacco use disorder 05/31/2015  . Essential hypertension 02/09/2015    Outpatient Encounter Medications as of 11/27/2017  Medication Sig  . acetaminophen (TYLENOL) 325 MG tablet Take 650 mg by mouth every 6 (six) hours as needed for mild pain.  Marland Kitchen albuterol (PROVENTIL) (2.5 MG/3ML) 0.083% nebulizer solution Take 3 mLs (2.5 mg total) by nebulization every 6 (six) hours as needed for wheezing or shortness of breath.  Marland Kitchen aspirin 81 MG EC tablet Take 1 tablet (81 mg total) by mouth daily.  Marland Kitchen atorvastatin (LIPITOR) 80 MG tablet Take 80 mg by mouth daily.  . Carboxymeth-Glycerin-Polysorb (REFRESH OPTIVE ADVANCED) 0.5-1-0.5 % SOLN Apply 1 drop to eye 2 (two) times daily as needed (  dry eyes).   . citalopram (CELEXA) 20 MG tablet Take 20 mg by mouth daily.  . cycloSPORINE (RESTASIS) 0.05 % ophthalmic emulsion Place 1 drop into both eyes 2 (two) times daily.  . fluticasone (FLONASE) 50 MCG/ACT nasal spray Place 2 sprays into both nostrils daily.  . furosemide (LASIX) 40 MG tablet Take 1 tablet (40  mg total) by mouth daily.  Marland Kitchen glucose blood (IGLUCOSE TEST STRIPS) test strip 1 each by Other route 2 (two) times daily. Use as instructed  . Magnesium 200 MG TABS Take 1 tablet (200 mg total) by mouth daily.  . metFORMIN (GLUCOPHAGE) 1000 MG tablet Take 1 tablet (1,000 mg total) by mouth 2 (two) times daily with a meal.  . mometasone-formoterol (DULERA) 100-5 MCG/ACT AERO Inhale 2 puffs into the lungs 2 (two) times daily.  . nicotine (NICODERM CQ - DOSED IN MG/24 HOURS) 21 mg/24hr patch Place 1 patch (21 mg total) onto the skin daily. (Patient taking differently: Place 21 mg onto the skin daily. )  . nitroGLYCERIN (NITROSTAT) 0.4 MG SL tablet Place 1 tablet (0.4 mg total) under the tongue every 5 (five) minutes as needed for chest pain.  . Omega-3 Fatty Acids (FISH OIL) 1200 MG CAPS Take 1,200 mg by mouth 2 (two) times daily.  . pantoprazole (PROTONIX) 40 MG tablet Take 1 tablet (40 mg total) by mouth daily.  . potassium chloride SA (K-DUR,KLOR-CON) 20 MEQ tablet Take 1 tablet (20 mEq total) by mouth daily.  . prasugrel (EFFIENT) 10 MG TABS tablet TAKE 1 TABLET BY MOUTH ONCE A DAY.  Marland Kitchen PROVENTIL HFA 108 (90 Base) MCG/ACT inhaler INHALE 2 PUFFS INTO THE LUNGS EVERY 6 HOURS AS NEEDED FOR SHORTNESS OF BREATH OR WHEEZING.  . traZODone (DESYREL) 100 MG tablet Take 100 mg by mouth at bedtime.   . [DISCONTINUED] predniSONE (DELTASONE) 20 MG tablet Take 2 po QD x 3d then 1 po QD x 3d (Patient not taking: Reported on 11/27/2017)   No facility-administered encounter medications on file as of 11/27/2017.     Allergies  Allergen Reactions  . Carvedilol Other (See Comments)    Sinus pause on telemetry >3 seconds. Longest one 9 sec. No AV nodal agent  . Lisinopril Anaphylaxis, Shortness Of Breath and Swelling    Angioedema, required intubation and mechanical ventilation  . Amoxicillin Nausea And Vomiting    Nausea only - no allergy    Review of Systems  Constitutional: Positive for activity change and  unexpected weight change.       More active. losing weight  HENT: Negative for congestion and rhinorrhea.   Eyes: Negative for visual disturbance.       Eye exam UTD  Respiratory: Positive for shortness of breath.        Persists but improving after cardiac rehab  Cardiovascular: Negative for chest pain and leg swelling.  Gastrointestinal: Negative for constipation and diarrhea.  Genitourinary: Negative for difficulty urinating and frequency.  Musculoskeletal: Positive for neck pain and neck stiffness.       Chronic orhtopedic problems  Neurological: Positive for numbness. Negative for weakness.  Hematological: Bruises/bleeds easily.  All other systems reviewed and are negative.   BP 136/70   Pulse (!) 101   Temp 97.8 F (36.6 C)   Resp 16   Ht 5\' 6"  (1.676 m)   Wt 217 lb 0.6 oz (98.4 kg)   SpO2 96%   BMI 35.03 kg/m   Physical Exam  Constitutional: He is oriented to person, place,  and time. He appears well-developed and well-nourished. No distress.  HENT:  Head: Normocephalic and atraumatic.  Mouth/Throat: Oropharynx is clear and moist.  Eyes: Pupils are equal, round, and reactive to light. Conjunctivae are normal.  Neck: Normal range of motion.  Cardiovascular: Normal rate, regular rhythm and normal heart sounds.  Pulses:      Dorsalis pedis pulses are 2+ on the right side, and 2+ on the left side.       Posterior tibial pulses are 2+ on the right side, and 2+ on the left side.  Pulmonary/Chest: Effort normal and breath sounds normal. He has no wheezes.  Abdominal: Soft. Bowel sounds are normal.  Obese abdomen, soft  Musculoskeletal:       Right foot: There is normal range of motion and no deformity.       Left foot: There is normal range of motion and no deformity.  Phalen's and Tinel's are positive on the right.  No sensory deficit.  No muscular weakness.  Feet:  Right Foot:  Protective Sensation: 6 sites tested. 6 sites sensed.  Skin Integrity: Positive for  callus. Negative for ulcer, blister or skin breakdown.  Left Foot:  Protective Sensation: 6 sites tested. 6 sites sensed.  Skin Integrity: Positive for callus. Negative for ulcer, blister or skin breakdown.  Lymphadenopathy:    He has no cervical adenopathy.  Neurological: He is alert and oriented to person, place, and time.  Skin: Skin is warm and dry.  Psychiatric: He has a normal mood and affect. His behavior is normal.  callus - small - under lat edge great toe  ASSESSMENT/PLAN:  1. Cervical disc disease chronic  2. Carpal tunnel syndrome of right wrist Under vare Othro  3. Tobacco use disorder Nicotine dependence- resistant to counseling and help  4. Coronary artery disease involving native coronary artery of native heart with unstable angina pectoris ([place]) Asymptomatic, progressing through rehab  5. Essential hypertension Controlled  6. NIDDM Good A1c, no eye foot or renal complications   Patient Instructions  Diabetes is controlled Blood pressure is good STOP SMOKING Need for follow up the hand numbness - repair is simple  No change in medicines Walk every day that you are able  Make sure you have a PCP follow up within 3 momths We have list   Raylene Everts, MD

## 2017-11-30 ENCOUNTER — Ambulatory Visit: Payer: Medicaid Other | Admitting: Family Medicine

## 2017-12-02 ENCOUNTER — Ambulatory Visit (INDEPENDENT_AMBULATORY_CARE_PROVIDER_SITE_OTHER): Payer: Medicaid Other | Admitting: *Deleted

## 2017-12-02 DIAGNOSIS — R55 Syncope and collapse: Secondary | ICD-10-CM | POA: Diagnosis not present

## 2017-12-03 NOTE — Progress Notes (Signed)
[facility] Summary Report / Loop Recorder 

## 2017-12-04 ENCOUNTER — Emergency Department (HOSPITAL_COMMUNITY)
Admission: EM | Admit: 2017-12-04 | Discharge: 2017-12-04 | Disposition: A | Discharge status: Home / Self Care | Payer: Medicaid Other | Attending: Emergency Medicine | Admitting: Emergency Medicine

## 2017-12-04 ENCOUNTER — Other Ambulatory Visit: Payer: Self-pay

## 2017-12-04 ENCOUNTER — Encounter (HOSPITAL_COMMUNITY): Payer: Self-pay | Admitting: Emergency Medicine

## 2017-12-04 ENCOUNTER — Telehealth: Payer: Self-pay | Admitting: Family Medicine

## 2017-12-04 DIAGNOSIS — R040 Epistaxis: Secondary | ICD-10-CM | POA: Diagnosis not present

## 2017-12-04 DIAGNOSIS — I259 Chronic ischemic heart disease, unspecified: Secondary | ICD-10-CM | POA: Insufficient documentation

## 2017-12-04 DIAGNOSIS — I110 Hypertensive heart disease with heart failure: Secondary | ICD-10-CM | POA: Diagnosis not present

## 2017-12-04 DIAGNOSIS — E119 Type 2 diabetes mellitus without complications: Secondary | ICD-10-CM | POA: Insufficient documentation

## 2017-12-04 DIAGNOSIS — Z79899 Other long term (current) drug therapy: Secondary | ICD-10-CM | POA: Insufficient documentation

## 2017-12-04 DIAGNOSIS — J449 Chronic obstructive pulmonary disease, unspecified: Secondary | ICD-10-CM | POA: Insufficient documentation

## 2017-12-04 DIAGNOSIS — Z794 Long term (current) use of insulin: Secondary | ICD-10-CM | POA: Insufficient documentation

## 2017-12-04 DIAGNOSIS — Z7982 Long term (current) use of aspirin: Secondary | ICD-10-CM | POA: Insufficient documentation

## 2017-12-04 DIAGNOSIS — F17201 Nicotine dependence, unspecified, in remission: Secondary | ICD-10-CM | POA: Diagnosis not present

## 2017-12-04 DIAGNOSIS — I5042 Chronic combined systolic (congestive) and diastolic (congestive) heart failure: Secondary | ICD-10-CM | POA: Diagnosis not present

## 2017-12-04 DIAGNOSIS — R58 Hemorrhage, not elsewhere classified: Secondary | ICD-10-CM

## 2017-12-04 MED ORDER — SILVER NITRATE-POT NITRATE 75-25 % EX MISC
CUTANEOUS | Status: AC
  Filled 2017-12-04: qty 2

## 2017-12-04 NOTE — Telephone Encounter (Signed)
Patient called in requesting status of his medical report that was sent to [place] to get his license back.  Cb#: [phone]
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
## 2017-12-04 NOTE — ED Provider Notes (Signed)
[facility] EMERGENCY DEPARTMENT Provider Note   CSN: [id number] Arrival date & time: 12/04/17  2002     History   Chief Complaint Chief Complaint  Patient presents with  . Nose bleed (external)    HPI Randy Hebert is a 52 y.o. male.  Patient states she is having some bleeding from the skin on the outside of his nose.  He takes aspirin and this is happened before  The history is provided by the patient. No language interpreter was used.  Illness  This is a new problem. The current episode started 1 to 2 hours ago. The problem occurs constantly. The problem has been resolved. Pertinent negatives include no chest pain, no abdominal pain and no headaches. Nothing aggravates the symptoms. Nothing relieves the symptoms. He has tried nothing for the symptoms. The treatment provided no relief.    Past Medical History:  Diagnosis Date  . Allergy   . Anxiety   . Arthritis   . Asthma    hip replacement  . Back pain   . Bulging of cervical intervertebral disc   . CAD (coronary artery disease)    lateral STEMI 02/06/2015 00% D1 occlusion treated with Promus Premier 2.5 mm x 16 mm DES, 70% ramus stenosis, 40% mid RCA stenosis, 45% distal RCA stenosis, EF 45-50%  . CHF (congestive heart failure) ([place])   . COPD (chronic obstructive pulmonary disease) ([place])   . Depression   . Diabetes mellitus without complication ([place])   . 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 Dry eye   . Dyspnea   . Early satiety 09/23/2016  . GERD (gastroesophageal reflux disease)   . Headache   . Heart murmur   . Hip pain   . Hyperlipidemia   . Hypertension   . MI (myocardial infarction) ([place])   . Myocardial infarction ([place])   . Neck pain   . Otitis media   . Pleurisy   . Sinus pause    9 sec sinus pause on telemetry after started on coreg after MI, avoid AV nodal blocking agent  . Sleep apnea   . 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 Unilateral primary  osteoarthritis, right hip 07/01/2016    Patient Active Problem List   Diagnosis Date Noted  . DM type 2 causing vascular disease ([place]) 08/17/2017  . Noncompliance 07/10/2017  . Syncope 06/29/2017  . HCAP (healthcare-associated pneumonia) 05/15/2017  . Uncontrolled type 2 diabetes mellitus with hyperglycemia, without long-term current use of insulin ([place]) 05/15/2017  . Acute on chronic respiratory failure with hypoxia ([place]) 05/15/2017  . Lobar pneumonia ([place]) 05/15/2017  . Chronic combined systolic and diastolic CHF (congestive heart failure) ([place]) 05/15/2017  . Sinus pause 05/15/2017  . Syncope and collapse 05/15/2017  . Bradycardia 04/28/2017  . Sleep apnea 04/28/2017  . Non-ST elevation (NSTEMI) myocardial infarction ([place]) 04/27/2017  . NSTEMI (non-ST elevated myocardial infarction) ([place]) 04/26/2017  . Substance abuse ([place]) 04/15/2017  . Depression with anxiety 04/15/2017  . GERD (gastroesophageal reflux disease) 09/23/2016  . 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 Chronic obstructive pulmonary disease ([place]) 08/27/2015  . Cigarette nicotine dependence with nicotine-induced disorder 08/27/2015  . Hyperlipidemia 08/27/2015  . Class 2 severe obesity due to excess calories with serious comorbidity and body mass index (BMI) of 36.0 to 36.9 in adult [facility]) 08/27/2015  . Acute on chronic systolic heart failure ([place]) 06/02/2015  . ACE inhibitor-aggravated angioedema   .  Chronic systolic CHF (congestive heart failure) ([place]) 06/01/2015  . CAD (coronary artery disease) 05/31/2015  . STEMI (ST elevation myocardial infarction) ([place]) 05/31/2015  . Tobacco use disorder 05/31/2015  . Essential hypertension 02/09/2015    Past Surgical History:  Procedure Laterality Date  . BIOPSY  10/09/2016   Procedure: BIOPSY;  Surgeon: Daneil Dolin, MD;  [facility];  Service: Endoscopy;;  . CARDIAC CATHETERIZATION N/A 02/06/2015   Procedure: Left Heart Cath and Coronary  Angiography;  Surgeon: Leonie Man, MD;  Location: [place] CV LAB;  Service: Cardiovascular;  Laterality: N/A;  . CARDIAC CATHETERIZATION N/A 02/06/2015   Procedure: Coronary Stent Intervention;  Surgeon: Leonie Man, MD;  Location: [place] CV LAB;  Service: Cardiovascular;  Laterality: N/A;  . COLONOSCOPY WITH PROPOFOL N/A 10/09/2016   Procedure: COLONOSCOPY WITH PROPOFOL;  Surgeon: Daneil Dolin, MD;  [facility];  Service: Endoscopy;  Laterality: N/A;  12:00 pm  . COLONOSCOPY WITH PROPOFOL N/A 11/24/2016   Procedure: COLONOSCOPY WITH PROPOFOL;  Surgeon: Daneil Dolin, MD;  [facility];  Service: Endoscopy;  Laterality: N/A;  200  . CORONARY ANGIOPLASTY WITH STENT PLACEMENT  01/2015  . CORONARY STENT INTERVENTION N/A 04/27/2017   Procedure: CORONARY STENT INTERVENTION;  Surgeon: Nelva Bush, MD;  Location: [place] CV LAB;  Service: Cardiovascular;  Laterality: N/A;  . ELECTROPHYSIOLOGY STUDY N/A 06/29/2017   Procedure: ELECTROPHYSIOLOGY STUDY;  Surgeon: Evans Lance, MD;  Location: [place] CV LAB;  Service: Cardiovascular;  Laterality: N/A;  . ESOPHAGOGASTRODUODENOSCOPY (EGD) WITH PROPOFOL N/A 10/09/2016   Procedure: ESOPHAGOGASTRODUODENOSCOPY (EGD) WITH PROPOFOL;  Surgeon: Daneil Dolin, MD;  [facility];  Service: Endoscopy;  Laterality: N/A;  . INCISION / DRAINAGE HAND / FINGER    . LEFT HEART CATH AND CORONARY ANGIOGRAPHY N/A 04/27/2017   Procedure: LEFT HEART CATH AND CORONARY ANGIOGRAPHY;  Surgeon: Nelva Bush, MD;  Location: [place] CV LAB;  Service: Cardiovascular;  Laterality: N/A;  . LOOP RECORDER INSERTION  06/29/2017   Procedure: Loop Recorder Insertion;  Surgeon: Evans Lance, MD;  Location: [place] CV LAB;  Service: Cardiovascular;;  . POLYPECTOMY  11/24/2016   Procedure: POLYPECTOMY;  Surgeon: Daneil Dolin, MD;  [facility];  Service: Endoscopy;;  descending and sigmoid  . TOTAL HIP  ARTHROPLASTY Right 07/01/2016  . TOTAL HIP ARTHROPLASTY Right 07/01/2016   Procedure: RIGHT TOTAL HIP ARTHROPLASTY ANTERIOR APPROACH;  Surgeon: Mcarthur Rossetti, MD;  Location: [place];  Service: Orthopedics;  Laterality: Right;        Home Medications    Prior to Admission medications   Medication Sig Start Date End Date Taking? Authorizing Provider  acetaminophen (TYLENOL) 325 MG tablet Take 650 mg by mouth every 6 (six) hours as needed for mild pain.    [provider]  albuterol (PROVENTIL) (2.5 MG/3ML) 0.083% nebulizer solution Take 3 mLs (2.5 mg total) by nebulization every 6 (six) hours as needed for wheezing or shortness of breath. 05/27/16   Soyla Dryer, PA-C  aspirin 81 MG EC tablet Take 1 tablet (81 mg total) by mouth daily. 04/29/17   Cheryln Manly, NP  atorvastatin (LIPITOR) 80 MG tablet Take 80 mg by mouth daily.    [provider]  Carboxymeth-Glycerin-Polysorb (REFRESH OPTIVE ADVANCED) 0.5-1-0.5 % SOLN Apply 1 drop to eye 2 (two) times daily as needed (dry eyes).     [provider]  citalopram (CELEXA) 20 MG tablet Take  20 mg by mouth daily. 11/06/17   [provider]  cycloSPORINE (RESTASIS) 0.05 % ophthalmic emulsion Place 1 drop into both eyes 2 (two) times daily.    [provider]  fluticasone (FLONASE) 50 MCG/ACT nasal spray Place 2 sprays into both nostrils daily. 09/18/17   Raylene Everts, MD  furosemide (LASIX) 40 MG tablet Take 1 tablet (40 mg total) by mouth daily. 07/03/17   Raylene Everts, MD  glucose blood (IGLUCOSE TEST STRIPS) test strip 1 each by Other route 2 (two) times daily. Use as instructed 07/31/17   Raylene Everts, MD  Magnesium 200 MG TABS Take 1 tablet (200 mg total) by mouth daily. 03/06/17   Lendon Colonel, NP  metFORMIN (GLUCOPHAGE) 1000 MG tablet Take 1 tablet (1,000 mg total) by mouth 2 (two) times daily with a meal. 04/15/17   Raylene Everts, MD  mometasone-formoterol  [facility]) 100-5 MCG/ACT AERO Inhale 2 puffs into the lungs 2 (two) times daily. 06/17/17   Raylene Everts, MD  nicotine (NICODERM CQ - DOSED IN MG/24 HOURS) 21 mg/24hr patch Place 1 patch (21 mg total) onto the skin daily. Patient taking differently: Place 21 mg onto the skin daily.  04/15/17   Raylene Everts, MD  nitroGLYCERIN (NITROSTAT) 0.4 MG SL tablet Place 1 tablet (0.4 mg total) under the tongue every 5 (five) minutes as needed for chest pain. 10/21/17   Raylene Everts, MD  Omega-3 Fatty Acids (FISH OIL) 1200 MG CAPS Take 1,200 mg by mouth 2 (two) times daily.    [provider]  pantoprazole (PROTONIX) 40 MG tablet Take 1 tablet (40 mg total) by mouth daily. 10/13/16   Rourk, Cristopher Estimable, MD  potassium chloride SA (K-DUR,KLOR-CON) 20 MEQ tablet Take 1 tablet (20 mEq total) by mouth daily. 07/02/17   Raylene Everts, MD  prasugrel (EFFIENT) 10 MG TABS tablet TAKE 1 TABLET BY MOUTH ONCE A DAY. 10/08/17   Evans Lance, MD  PROVENTIL HFA 108 [phone] Base) MCG/ACT inhaler INHALE 2 PUFFS INTO THE LUNGS EVERY 6 HOURS AS NEEDED FOR SHORTNESS OF BREATH OR WHEEZING. 08/13/17   Raylene Everts, MD  traZODone (DESYREL) 100 MG tablet Take 100 mg by mouth at bedtime.     [provider]    Family History Family History  Problem Relation Age of Onset  . Heart attack Father   . Stroke Father   . Arthritis Father   . Heart disease Father   . Cancer Mother        ???  . Arthritis Mother   . Heart disease Brother 76       died in sleep  . Early death Brother   . Diabetes Maternal Uncle   . Alzheimer's disease Maternal Grandmother     Social History Social History   Tobacco Use  . Smoking status: Current Every Day Smoker    Packs/day: 1.00    Years: 46.00    Pack years: 46.00    Types: Cigarettes    Start date: 06/07/1979  . Smokeless tobacco: Former Systems developer    Types: Chew  Substance Use Topics  . Alcohol use: Yes    Alcohol/week: 6.6 - 7.2 oz    Types: 5 - 6  Shots of liquor, 6 Cans of beer per week    Comment: alcoholic, now drinks intermittently  . Drug use: No    Comment: history of drug use- marijuana, cocaine- quit 2014     Allergies  Carvedilol; Lisinopril; and Amoxicillin   Review of Systems Review of Systems  Constitutional: Negative for appetite change and fatigue.  HENT: Negative for congestion, ear discharge and sinus pressure.   Eyes: Negative for discharge.  Respiratory: Negative for cough.   Cardiovascular: Negative for chest pain.  Gastrointestinal: Negative for abdominal pain and diarrhea.  Genitourinary: Negative for frequency and hematuria.  Musculoskeletal: Negative for back pain.  Skin: Negative for rash.       Bleeding from nose  Neurological: Negative for seizures and headaches.  Psychiatric/Behavioral: Negative for hallucinations.     Physical Exam Updated Vital Signs BP (!) 138/92 (BP Location: Right Arm)   Pulse 98   Temp 98.1 F (36.7 C) (Oral)   Resp 18   Ht 5\' 6"  (1.676 m)   Wt 97.5 kg (215 lb)   SpO2 95%   BMI 34.70 kg/m   Physical Exam  Constitutional: He is oriented to person, place, and time. He appears well-developed.  HENT:  Head: Normocephalic.  Dried blood on the outside of his nose.  Eyes: Conjunctivae are normal.  Neck: No tracheal deviation present.  Cardiovascular:  No murmur heard. Musculoskeletal: Normal range of motion.  Neurological: He is oriented to person, place, and time.  Skin: Skin is warm.  Psychiatric: He has a normal mood and affect.     ED Treatments / Results  Labs (all labs ordered are listed, but only abnormal results are displayed) Labs Reviewed - No data to display  EKG None  Radiology No results found.  Procedures Procedures (including critical care time)  Medications Ordered in ED Medications - No data to display   Initial Impression / Assessment and Plan / ED Course  I have reviewed the triage vital signs and the nursing  notes.  Pertinent labs & imaging results that were available during my care of the patient were reviewed by me and considered in my medical decision making (see chart for details).     She had persistent bleeding from the outside of his nose.  It has stopped with pressure.  He will be sent home with a couple silver nitrate sticks and told to put pressure and use sticks if needed and follow-up with his doctor if needed  Final Clinical Impressions(s) / ED Diagnoses   Final diagnoses:  Bleeding    ED Discharge Orders    None       Milton Ferguson, MD 12/04/17 2220

## 2017-12-04 NOTE — Discharge Instructions (Addendum)
Follow-up with your doctor for any problems.  If no started bleeding again on the outside he can use a silver nitrate stick with pressure return to the ER if any problems

## 2017-12-07 LAB — CUP PACEART REMOTE DEVICE CHECK
Implantable Pulse Generator Implant Date: 20181029
MDC IDC SESS DTM: 20190301214038

## 2017-12-08 NOTE — Telephone Encounter (Signed)
 Do you know anything about the papers he is talking about. They aren't mentioned in your notes at his last office visit. Please advise. Thank you.    S.

## 2017-12-08 NOTE — Telephone Encounter (Signed)
He has asked about this more than once.  Tell him I completed all his forms.  If [facility] does not have them he has to re submit them (get new form for me)  We will not charge him a second fee.

## 2017-12-08 NOTE — Telephone Encounter (Signed)
Spoke with patient and let him know that [facility] should have the forms and if they don't then he will need to resubmit them. He can bring a new form and have Dr. Meda Coffee complete it at no charge, per Dr.Nelson. Made patient aware of Dr.Nelson's last day in the office with verbal understanding.

## 2017-12-09 ENCOUNTER — Telehealth: Payer: Self-pay | Admitting: Internal Medicine

## 2017-12-09 NOTE — Telephone Encounter (Signed)
Michelle lvm wanting to know if pt will need pre meds prior to having cleanings, extractions, etc. Please give her a call @ [phone]

## 2017-12-09 NOTE — Telephone Encounter (Signed)
Will forward to Dr. Branch to advise.  

## 2017-12-10 NOTE — Telephone Encounter (Signed)
No premeds needed   [name] MD

## 2017-12-10 NOTE — Telephone Encounter (Signed)
Randy Hebert notified no premeds needed.

## 2017-12-17 ENCOUNTER — Other Ambulatory Visit: Payer: Self-pay | Admitting: Family Medicine

## 2017-12-17 ENCOUNTER — Other Ambulatory Visit: Payer: Self-pay | Admitting: Internal Medicine

## 2017-12-25 ENCOUNTER — Ambulatory Visit (INDEPENDENT_AMBULATORY_CARE_PROVIDER_SITE_OTHER): Payer: Medicare Other | Admitting: Physical Medicine and Rehabilitation

## 2017-12-25 ENCOUNTER — Encounter (INDEPENDENT_AMBULATORY_CARE_PROVIDER_SITE_OTHER): Payer: Self-pay | Admitting: Physical Medicine and Rehabilitation

## 2017-12-25 DIAGNOSIS — R202 Paresthesia of skin: Secondary | ICD-10-CM | POA: Diagnosis not present

## 2017-12-25 NOTE — Progress Notes (Signed)
 .  Numeric Pain Rating Scale and Functional Assessment Average Pain 8   In the last MONTH (on 0-10 scale) has pain interfered with the following?  1. General activity like being  able to carry out your everyday physical activities such as walking, climbing stairs, carrying groceries, or moving a chair?  Rating(8)   +Driver, -BT, -Dye Allergies.  

## 2017-12-28 ENCOUNTER — Ambulatory Visit (INDEPENDENT_AMBULATORY_CARE_PROVIDER_SITE_OTHER): Payer: Medicaid Other | Admitting: Orthopaedic Surgery

## 2017-12-28 ENCOUNTER — Encounter (INDEPENDENT_AMBULATORY_CARE_PROVIDER_SITE_OTHER): Payer: Self-pay | Admitting: Orthopaedic Surgery

## 2017-12-28 VITALS — Ht 66.0 in | Wt 215.0 lb

## 2017-12-28 DIAGNOSIS — R200 Anesthesia of skin: Secondary | ICD-10-CM

## 2017-12-28 DIAGNOSIS — R202 Paresthesia of skin: Secondary | ICD-10-CM

## 2017-12-28 NOTE — Progress Notes (Signed)
The patient is following up after having bilateral upper extremity nerve conduction studies to rule out carpal tunnel syndrome.  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 Hal Neer before.  I do not recommend surgery.  He does have swelling that goes on intermittently in his right wrist and he does wear a nurse down at night.  On exam he does have limited dorsiflexion of both his wrists more so on the right than the left.  He has subjective numbness in his fingers but well-perfused hands.  I reviewed the nerve conduction studies with Dr. Ernestina Patches and they are entirely normal in terms of no evidence of neuropathy in the upper extremities that is either compressive or physiologic.  There is no evidence of cervical radiculopathy on the studies either.  At this point I gave him reassurance that he does not have carpal tunnel syndrome.  All questions concerns were answered and addressed.  He does wear a wrist splint at night which I am fine with him doing.  He will follow-up as needed.

## 2017-12-29 NOTE — Procedures (Signed)
EMG & NCV Findings: Evaluation of the right median motor nerve showed borderline  conduction velocity (Elbow-Wrist, 49 m/s).  The right median (across palm) sensory nerve showed no response (Palm).  All remaining nerves (as indicated in the following tables) were within normal limits.  All left vs. right side differences were within normal limits.    Needle evaluation of the right pronator teres and the right biceps muscles showed increased insertional activity.  All remaining muscles (as indicated in the following table) showed no evidence of electrical instability.    Impression: Essentially NORMAL electrodiagnostic study of both upper limbs.  There is no significant electrodiagnostic evidence of nerve entrapment, brachial plexopathy or cervical radiculopathy.    As you know, purely sensory or demyelinating radiculopathies and chemical radiculitis may not be detected with this particular electrodiagnostic study.  **The needle EMG findings were very mild and while not diagnostic me that this is a cervical origin symptoms such as a C6 radiculitis.  Recommendations: 1.  Follow-up with referring physician. 2.   Continue current management of symptoms.  Conservative treatment for possible radiculitis    Nerve Conduction Studies Anti Sensory Summary Table   Stim Site NR Peak (ms) Norm Peak (ms) P-T Amp (V) Norm P-T Amp Site1 Site2 Delta-P (ms) Dist (cm) Vel (m/s) Norm Vel (m/s)  Left Median Acr Palm Anti Sensory (2nd Digit)  34.3C  Wrist    3.4 <3.6 20.1 >10 Wrist Palm 1.9 0.0    Palm    1.5 <2.0 11.0         Right Median Acr Palm Anti Sensory (2nd Digit)  32.7C  Wrist    3.5 <3.6 21.2 >10 Wrist Palm  0.0    Palm *NR  <2.0          Right Radial Anti Sensory (Base 1st Digit)  33.4C  Wrist    2.2 <3.1 18.1  Wrist Base 1st Digit 2.2 0.0    Right Ulnar Anti Sensory (5th Digit)  33.3C  Wrist    3.1 <3.7 16.4 >15.0 Wrist 5th Digit 3.1 14.0 45 >38   Motor Summary Table   Stim Site NR Onset  (ms) Norm Onset (ms) O-P Amp (mV) Norm O-P Amp Site1 Site2 Delta-0 (ms) Dist (cm) Vel (m/s) Norm Vel (m/s)  Left Median Motor (Abd Poll Brev)  34.4C  Wrist    3.4 <4.2 6.7 >5 Elbow Wrist 3.9 20.0 51 >50  Elbow    7.3  6.4         Right Median Motor (Abd Poll Brev)  33.5C  Wrist    3.5 <4.2 6.1 >5 Elbow Wrist 4.0 19.5 *49 >50  Elbow    7.5  5.9         Right Ulnar Motor (Abd Dig Min)  33.6C  Wrist    2.8 <4.2 7.8 >3 B Elbow Wrist 3.3 18.5 56 >53  B Elbow    6.1  8.1  A Elbow B Elbow 1.2 9.5 79 >53  A Elbow    7.3  7.8          EMG   Side Muscle Nerve Root Ins Act Fibs Psw Amp Dur Poly Recrt Int Fraser Din Comment  Right Abd Poll Brev Median C8-T1 Nml Nml Nml Nml Nml 0 Nml Nml   Right 1stDorInt Ulnar C8-T1 Nml Nml Nml Nml Nml 0 Nml Nml   Right PronatorTeres Median C6-7 *Incr Nml Nml Nml Nml 0 Nml Nml   Right Biceps Musculocut C5-6 *Incr Nml Nml Nml  Nml 0 Nml Nml   Right Deltoid Axillary C5-6 Nml Nml Nml Nml Nml 0 Nml Nml     Nerve Conduction Studies Anti Sensory Left/Right Comparison   Stim Site L Lat (ms) R Lat (ms) L-R Lat (ms) L Amp (V) R Amp (V) L-R Amp (%) Site1 Site2 L Vel (m/s) R Vel (m/s) L-R Vel (m/s)  Median Acr Palm Anti Sensory (2nd Digit)  34.3C  Wrist 3.4 3.5 0.1 20.1 21.2 5.2 Wrist Palm     Palm 1.5   11.0         Radial Anti Sensory (Base 1st Digit)  33.4C  Wrist  2.2   18.1  Wrist Base 1st Digit     Ulnar Anti Sensory (5th Digit)  33.3C  Wrist  3.1   16.4  Wrist 5th Digit  45    Motor Left/Right Comparison   Stim Site L Lat (ms) R Lat (ms) L-R Lat (ms) L Amp (mV) R Amp (mV) L-R Amp (%) Site1 Site2 L Vel (m/s) R Vel (m/s) L-R Vel (m/s)  Median Motor (Abd Poll Brev)  34.4C  Wrist 3.4 3.5 0.1 6.7 6.1 9.0 Elbow Wrist 51 *49 2  Elbow 7.3 7.5 0.2 6.4 5.9 7.8       Ulnar Motor (Abd Dig Min)  33.6C  Wrist  2.8   7.8  B Elbow Wrist  56   B Elbow  6.1   8.1  A Elbow B Elbow  79   A Elbow  7.3   7.8           Waveforms:

## 2017-12-29 NOTE — Progress Notes (Signed)
[name] - 52 y.o. male MRN [id number]  Date of birth: 1965/11/23  Office Visit Note: Visit Date: 12/25/2017 PCP: Rosita Fire, MD Referred by: Raylene Everts, MD  Subjective: Chief Complaint  Patient presents with  . Right Hand - Pain  . Right Wrist - Pain  . Right Thumb - Numbness  . Right Index Finger - Numbness   HPI: Mr. Menz is a 52 year old right-hand-dominant gentleman who comes in today at the request of Dr. Ninfa Linden for electrodiagnostic studies of both upper limbs.  He reports mostly symptoms on the right wrist and hand.  He reports symptoms to the middle of the hand and some numbness of the right thumb and index finger.  He reports this started about a month ago and nothing seems to make it necessarily worse as far as positions or other activities.  He denies specific nocturnal complaints.  He reports that the bracing has helped some with the pain.  It was felt that he may have some carpal tunnel syndrome.  Emergency department physician did order CT scan of the cervical spine in February which is reviewed below.  This does show degenerative changes at C5-6 and C6-7 with some foraminal narrowing and central canal narrowing.  He has a long history of coronary artery disease with heart failure myocardial infarction as well as history of diabetes.  He has not had prior electrodiagnostic studies.   ROS Otherwise per HPI.  Assessment & Plan: Visit Diagnoses:  1. Paresthesia of skin     Plan: No additional findings.  Impression: Essentially NORMAL electrodiagnostic study of both upper limbs.  There is no significant electrodiagnostic evidence of nerve entrapment, brachial plexopathy or cervical radiculopathy.    As you know, purely sensory or demyelinating radiculopathies and chemical radiculitis may not be detected with this particular electrodiagnostic study.  **The needle EMG findings were very mild and while not diagnostic me that this is a cervical origin  symptoms such as a C6 radiculitis.  Recommendations: 1.  Follow-up with referring physician. 2.  Continue current management of symptoms.  Conservative treatment for possible radiculitis  Meds & Orders: No orders of the defined types were placed in this encounter.   Orders Placed This Encounter  Procedures  . NCV with EMG (electromyography)    Follow-up: Return for Dr. Ninfa Linden.   Procedures: No procedures performed  EMG & NCV Findings: Evaluation of the right median motor nerve showed borderline  conduction velocity (Elbow-Wrist, 49 m/s).  The right median (across palm) sensory nerve showed no response (Palm).  All remaining nerves (as indicated in the following tables) were within normal limits.  All left vs. right side differences were within normal limits.    Needle evaluation of the right pronator teres and the right biceps muscles showed increased insertional activity.  All remaining muscles (as indicated in the following table) showed no evidence of electrical instability.    Impression: Essentially NORMAL electrodiagnostic study of both upper limbs.  There is no significant electrodiagnostic evidence of nerve entrapment, brachial plexopathy or cervical radiculopathy.    As you know, purely sensory or demyelinating radiculopathies and chemical radiculitis may not be detected with this particular electrodiagnostic study.  **The needle EMG findings were very mild and while not diagnostic me that this is a cervical origin symptoms such as a C6 radiculitis.  Recommendations: 1.  Follow-up with referring physician. 2.   Continue current management of symptoms.  Conservative treatment for possible radiculitis    Nerve Conduction Studies  Anti Sensory Summary Table   Stim Site NR Peak (ms) Norm Peak (ms) P-T Amp (V) Norm P-T Amp Site1 Site2 Delta-P (ms) Dist (cm) Vel (m/s) Norm Vel (m/s)  Left Median Acr Palm Anti Sensory (2nd Digit)  34.3C  Wrist    3.4 <3.6 20.1 >10 Wrist Palm  1.9 0.0    Palm    1.5 <2.0 11.0         Right Median Acr Palm Anti Sensory (2nd Digit)  32.7C  Wrist    3.5 <3.6 21.2 >10 Wrist Palm  0.0    Palm *NR  <2.0          Right Radial Anti Sensory (Base 1st Digit)  33.4C  Wrist    2.2 <3.1 18.1  Wrist Base 1st Digit 2.2 0.0    Right Ulnar Anti Sensory (5th Digit)  33.3C  Wrist    3.1 <3.7 16.4 >15.0 Wrist 5th Digit 3.1 14.0 45 >38   Motor Summary Table   Stim Site NR Onset (ms) Norm Onset (ms) O-P Amp (mV) Norm O-P Amp Site1 Site2 Delta-0 (ms) Dist (cm) Vel (m/s) Norm Vel (m/s)  Left Median Motor (Abd Poll Brev)  34.4C  Wrist    3.4 <4.2 6.7 >5 Elbow Wrist 3.9 20.0 51 >50  Elbow    7.3  6.4         Right Median Motor (Abd Poll Brev)  33.5C  Wrist    3.5 <4.2 6.1 >5 Elbow Wrist 4.0 19.5 *49 >50  Elbow    7.5  5.9         Right Ulnar Motor (Abd Dig Min)  33.6C  Wrist    2.8 <4.2 7.8 >3 B Elbow Wrist 3.3 18.5 56 >53  B Elbow    6.1  8.1  A Elbow B Elbow 1.2 9.5 79 >53  A Elbow    7.3  7.8          EMG   Side Muscle Nerve Root Ins Act Fibs Psw Amp Dur Poly Recrt Int Fraser Din Comment  Right Abd Poll Brev Median C8-T1 Nml Nml Nml Nml Nml 0 Nml Nml   Right 1stDorInt Ulnar C8-T1 Nml Nml Nml Nml Nml 0 Nml Nml   Right PronatorTeres Median C6-7 *Incr Nml Nml Nml Nml 0 Nml Nml   Right Biceps Musculocut C5-6 *Incr Nml Nml Nml Nml 0 Nml Nml   Right Deltoid Axillary C5-6 Nml Nml Nml Nml Nml 0 Nml Nml     Nerve Conduction Studies Anti Sensory Left/Right Comparison   Stim Site L Lat (ms) R Lat (ms) L-R Lat (ms) L Amp (V) R Amp (V) L-R Amp (%) Site1 Site2 L Vel (m/s) R Vel (m/s) L-R Vel (m/s)  Median Acr Palm Anti Sensory (2nd Digit)  34.3C  Wrist 3.4 3.5 0.1 20.1 21.2 5.2 Wrist Palm     Palm 1.5   11.0         Radial Anti Sensory (Base 1st Digit)  33.4C  Wrist  2.2   18.1  Wrist Base 1st Digit     Ulnar Anti Sensory (5th Digit)  33.3C  Wrist  3.1   16.4  Wrist 5th Digit  45    Motor Left/Right Comparison   Stim Site L Lat (ms) R Lat  (ms) L-R Lat (ms) L Amp (mV) R Amp (mV) L-R Amp (%) Site1 Site2 L Vel (m/s) R Vel (m/s) L-R Vel (m/s)  Median Motor (Abd Poll Brev)  34.4C  Wrist 3.4  3.5 0.1 6.7 6.1 9.0 Elbow Wrist 51 *49 2  Elbow 7.3 7.5 0.2 6.4 5.9 7.8       Ulnar Motor (Abd Dig Min)  33.6C  Wrist  2.8   7.8  B Elbow Wrist  56   B Elbow  6.1   8.1  A Elbow B Elbow  79   A Elbow  7.3   7.8           Waveforms:                Clinical History: CT CERVICAL SPINE FINDINGS  Alignment: Reversal of cervical lordosis. 2 mm anterolisthesis of C3 on C4. Facet alignment is within normal limits.  Skull base and vertebrae: No acute fracture. No primary bone lesion or focal pathologic process.  Soft tissues and spinal canal: No prevertebral fluid or swelling. No visible canal hematoma.  Disc levels: Mild degenerative changes at C3-C4 and C4-C5 with moderate degenerative changes at C5-C6 and C6-C7. Multiple level bilateral foraminal narrowing. Posterior disc osteophyte at C5-C6 and C6-C7 results in mild to moderate canal stenosis.  Upper chest: Negative.  Other: None  IMPRESSION: 1. No CT evidence for acute intracranial abnormality. 2. Trace anterolisthesis of C3 on C4 without acute osseous abnormality. Mild to moderate diffuse degenerative changes, most marked at C5-C6 and C6-C7 where there is bilateral foraminal narrowing and posterior disc osteophyte complex contributing to narrowing of the canal.   Electronically Signed   By: Donavan Foil M.D.   On: 10/24/2017 03:54   He reports that he has been smoking cigarettes.  He started smoking about 38 years ago. He has a 46.00 pack-year smoking history. He has quit using smokeless tobacco. His smokeless tobacco use included chew.  Recent Labs    04/15/17 0927 11/17/17 0709  HGBA1C 7.6* 6.5*    Objective:  VS:  HT:    WT:   BMI:     BP:   HR: bpm  TEMP: ( )  RESP:  Physical Exam  Musculoskeletal:  Inspection reveals some osteoarthritis  of the hands no atrophy of the bilateral APB or FDI or hand intrinsics. There is no swelling, color changes, allodynia or dystrophic changes. There is 5 out of 5 strength in the bilateral wrist extension, finger abduction and long finger flexion. There is intact sensation to light touch in all dermatomal and peripheral nerve distributions.  There is a negative Hoffmann's test bilaterally.    Ortho Exam Imaging: No results found.  Past Medical/Family/Surgical/Social History: Medications & Allergies reviewed per EMR, new medications updated. Patient Active Problem List   Diagnosis Date Noted  . DM type 2 causing vascular disease ([place]) 08/17/2017  . Noncompliance 07/10/2017  . Syncope 06/29/2017  . HCAP (healthcare-associated pneumonia) 05/15/2017  . Uncontrolled type 2 diabetes mellitus with hyperglycemia, without long-term current use of insulin ([place]) 05/15/2017  . Acute on chronic respiratory failure with hypoxia ([place]) 05/15/2017  . Lobar pneumonia ([place]) 05/15/2017  . Chronic combined systolic and diastolic CHF (congestive heart failure) ([place]) 05/15/2017  . Sinus pause 05/15/2017  . Syncope and collapse 05/15/2017  . Bradycardia 04/28/2017  . Sleep apnea 04/28/2017  . Non-ST elevation (NSTEMI) myocardial infarction ([place]) 04/27/2017  . NSTEMI (non-ST elevated myocardial infarction) ([place]) 04/26/2017  . Substance abuse ([place]) 04/15/2017  . Depression with anxiety 04/15/2017  . GERD (gastroesophageal reflux disease) 09/23/2016  . 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 Chronic obstructive pulmonary disease ([place]) 08/27/2015  . Cigarette nicotine dependence with  nicotine-induced disorder 08/27/2015  . Hyperlipidemia 08/27/2015  . Class 2 severe obesity due to excess calories with serious comorbidity and body mass index (BMI) of 36.0 to 36.9 in adult [facility]) 08/27/2015  . Acute on chronic systolic heart failure ([place]) 06/02/2015  . ACE inhibitor-aggravated angioedema   .  Chronic systolic CHF (congestive heart failure) ([place]) 06/01/2015  . CAD (coronary artery disease) 05/31/2015  . STEMI (ST elevation myocardial infarction) ([place]) 05/31/2015  . Tobacco use disorder 05/31/2015  . Essential hypertension 02/09/2015   Past Medical History:  Diagnosis Date  . Allergy   . Anxiety   . Arthritis   . Asthma    hip replacement  . Back pain   . Bulging of cervical intervertebral disc   . CAD (coronary artery disease)    lateral STEMI 02/06/2015 00% D1 occlusion treated with Promus Premier 2.5 mm x 16 mm DES, 70% ramus stenosis, 40% mid RCA stenosis, 45% distal RCA stenosis, EF 45-50%  . CHF (congestive heart failure) ([place])   . COPD (chronic obstructive pulmonary disease) ([place])   . Depression   . Diabetes mellitus without complication ([place])   . 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 Dry eye   . Dyspnea   . Early satiety 09/23/2016  . GERD (gastroesophageal reflux disease)   . Headache   . Heart murmur   . Hip pain   . Hyperlipidemia   . Hypertension   . MI (myocardial infarction) ([place])   . Myocardial infarction ([place])   . Neck pain   . Otitis media   . Pleurisy   . Sinus pause    9 sec sinus pause on telemetry after started on coreg after MI, avoid AV nodal blocking agent  . Sleep apnea   . 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 Unilateral primary osteoarthritis, right hip 07/01/2016   Family History  Problem Relation Age of Onset  . Heart attack Father   . Stroke Father   . Arthritis Father   . Heart disease Father   . Cancer Mother        ???  . Arthritis Mother   . Heart disease Brother 73       died in sleep  . Early death Brother   . Diabetes Maternal Uncle   . Alzheimer's disease Maternal Grandmother    Past Surgical History:  Procedure Laterality Date  . BIOPSY  10/09/2016   Procedure: BIOPSY;  Surgeon: Daneil Dolin, MD;  [facility];  Service: Endoscopy;;  . CARDIAC  CATHETERIZATION N/A 02/06/2015   Procedure: Left Heart Cath and Coronary Angiography;  Surgeon: Leonie Man, MD;  Location: [facility];  Service: Cardiovascular;  Laterality: N/A;  . CARDIAC CATHETERIZATION N/A 02/06/2015   Procedure: Coronary Stent Intervention;  Surgeon: Leonie Man, MD;  Location: [facility];  Service: Cardiovascular;  Laterality: N/A;  . COLONOSCOPY WITH PROPOFOL N/A 10/09/2016   Procedure: COLONOSCOPY WITH PROPOFOL;  Surgeon: Daneil Dolin, MD;  [facility];  Service: Endoscopy;  Laterality: N/A;  12:00 pm  . COLONOSCOPY WITH PROPOFOL N/A 11/24/2016   Procedure: COLONOSCOPY WITH PROPOFOL;  Surgeon: Daneil Dolin, MD;  [facility];  Service: Endoscopy;  Laterality: N/A;  200  . CORONARY ANGIOPLASTY WITH STENT PLACEMENT  01/2015  . CORONARY STENT INTERVENTION N/A 04/27/2017   Procedure: CORONARY STENT INTERVENTION;  Surgeon: Nelva Bush, MD;  Location: [facility]  INVASIVE CV LAB;  Service: Cardiovascular;  Laterality: N/A;  . ELECTROPHYSIOLOGY STUDY N/A 06/29/2017   Procedure: ELECTROPHYSIOLOGY STUDY;  Surgeon: Evans Lance, MD;  Location: [place] [facility];  Service: Cardiovascular;  Laterality: N/A;  . ESOPHAGOGASTRODUODENOSCOPY (EGD) WITH PROPOFOL N/A 10/09/2016   Procedure: ESOPHAGOGASTRODUODENOSCOPY (EGD) WITH PROPOFOL;  Surgeon: Daneil Dolin, MD;  [facility];  Service: Endoscopy;  Laterality: N/A;  . INCISION / DRAINAGE HAND / FINGER    . LEFT HEART CATH AND CORONARY ANGIOGRAPHY N/A 04/27/2017   Procedure: LEFT HEART CATH AND CORONARY ANGIOGRAPHY;  Surgeon: Nelva Bush, MD;  Location: [facility];  Service: Cardiovascular;  Laterality: N/A;  . LOOP RECORDER INSERTION  06/29/2017   Procedure: Loop Recorder Insertion;  Surgeon: Evans Lance, MD;  Location: [place];  Service: Cardiovascular;;  . POLYPECTOMY  11/24/2016   Procedure: POLYPECTOMY;  Surgeon: Daneil Dolin, MD;  [facility];  Service: Endoscopy;;  descending and sigmoid  . TOTAL HIP ARTHROPLASTY Right 07/01/2016  . TOTAL HIP ARTHROPLASTY Right 07/01/2016   Procedure: RIGHT TOTAL HIP ARTHROPLASTY ANTERIOR APPROACH;  Surgeon: Mcarthur Rossetti, MD;  Location: [facility];  Service: Orthopedics;  Laterality: Right;   Social History   Occupational History  . Occupation: unemployed    Comment: Nature conservation officer    Comment: disability pending  Tobacco Use  . Smoking status: Current Every Day Smoker    Packs/day: 1.00    Years: 46.00    Pack years: 46.00    Types: Cigarettes    Start date: 06/07/1979  . Smokeless tobacco: Former Systems developer    Types: Chew  Substance and Sexual Activity  . Alcohol use: Yes    Alcohol/week: 6.6 - 7.2 oz    Types: 5 - 6 Shots of liquor, 6 Cans of beer per week    Comment: alcoholic, now drinks intermittently  . Drug use: No    Comment: history of drug use- marijuana, cocaine- quit 2014  . Sexual activity: Yes    Partners: Female    Birth control/protection: None

## 2018-01-04 ENCOUNTER — Ambulatory Visit (INDEPENDENT_AMBULATORY_CARE_PROVIDER_SITE_OTHER): Payer: Medicaid Other | Admitting: *Deleted

## 2018-01-04 DIAGNOSIS — R55 Syncope and collapse: Secondary | ICD-10-CM

## 2018-01-05 NOTE — Progress Notes (Signed)
[facility] Summary Report / Loop Recorder 

## 2018-01-06 LAB — CUP PACEART REMOTE DEVICE CHECK
MDC IDC PG IMPLANT DT: 20181029
MDC IDC SESS DTM: 20190403220900

## 2018-01-12 ENCOUNTER — Encounter: Payer: Medicaid Other | Admitting: Family Medicine

## 2018-01-19 ENCOUNTER — Encounter: Payer: Self-pay | Admitting: Internal Medicine

## 2018-01-19 ENCOUNTER — Ambulatory Visit (INDEPENDENT_AMBULATORY_CARE_PROVIDER_SITE_OTHER): Payer: Medicare Other | Admitting: Internal Medicine

## 2018-01-19 VITALS — BP 124/78 | HR 88 | Ht 67.0 in | Wt 215.0 lb

## 2018-01-19 DIAGNOSIS — R55 Syncope and collapse: Secondary | ICD-10-CM

## 2018-01-19 NOTE — Patient Instructions (Signed)
Medication Instructions:  Your physician recommends that you continue on your current medications as directed. Please refer to the Current Medication list given to you today.   Labwork: NONE   Testing/Procedures: NONE   Follow-Up: Your physician wants you to follow-up in: 1 year. You will receive a reminder letter in the mail two months in advance. If you don't receive a letter, please call our office to schedule the follow-up appointment.   Any Other Special Instructions Will Be Listed Below (If Applicable).     If you need a refill on your cardiac medications before your next appointment, please call your pharmacy.  Thank you for choosing [facility]!

## 2018-01-19 NOTE — Progress Notes (Signed)
HPI [name] returns today for followup. He has a remote h/o syncope and underwent insertion of an ILR. He has had nocturnal bradycardia. No chest pain or sob. He is active. He is still smoking. He denies additional syncope. No edema. He has lost 8 lbs.  Allergies  Allergen Reactions  . Carvedilol Other (See Comments)    Sinus pause on telemetry >3 seconds. Longest one 9 sec. No AV nodal agent  . Lisinopril Anaphylaxis, Shortness Of Breath and Swelling    Angioedema, required intubation and mechanical ventilation  . Amoxicillin Nausea And Vomiting    Nausea only - no allergy     Current Outpatient Medications  Medication Sig Dispense Refill  . acetaminophen (TYLENOL) 325 MG tablet Take 650 mg by mouth every 6 (six) hours as needed for mild pain.    Marland Kitchen albuterol (PROVENTIL) (2.5 MG/3ML) 0.083% nebulizer solution Take 3 mLs (2.5 mg total) by nebulization every 6 (six) hours as needed for wheezing or shortness of breath. 150 mL 1  . aspirin 81 MG EC tablet Take 1 tablet (81 mg total) by mouth daily. 30 tablet   . atorvastatin (LIPITOR) 80 MG tablet Take 80 mg by mouth daily.    . Carboxymeth-Glycerin-Polysorb (REFRESH OPTIVE ADVANCED) 0.5-1-0.5 % SOLN Apply 1 drop to eye 2 (two) times daily as needed (dry eyes).     . citalopram (CELEXA) 20 MG tablet Take 20 mg by mouth daily.    . cycloSPORINE (RESTASIS) 0.05 % ophthalmic emulsion Place 1 drop into both eyes 2 (two) times daily.    . fluticasone (FLONASE) 50 MCG/ACT nasal spray Place 2 sprays into both nostrils daily. 16 g 6  . furosemide (LASIX) 40 MG tablet Take 1 tablet (40 mg total) by mouth daily. 90 tablet 3  . glucose blood (IGLUCOSE TEST STRIPS) test strip 1 each by Other route 2 (two) times daily. Use as instructed 100 each 12  . Magnesium 200 MG TABS Take 1 tablet (200 mg total) by mouth daily. 30 each 6  . metFORMIN (GLUCOPHAGE) 1000 MG tablet Take 1 tablet (1,000 mg total) by mouth 2 (two) times daily with a meal. 180  tablet 3  . mometasone-formoterol (DULERA) 100-5 MCG/ACT AERO Inhale 2 puffs into the lungs 2 (two) times daily. 3 Inhaler 3  . nicotine (NICODERM CQ - DOSED IN MG/24 HOURS) 21 mg/24hr patch Place 1 patch (21 mg total) onto the skin daily. (Patient taking differently: Place 21 mg onto the skin daily. ) 28 patch 0  . nitroGLYCERIN (NITROSTAT) 0.4 MG SL tablet Place 1 tablet (0.4 mg total) under the tongue every 5 (five) minutes as needed for chest pain. 25 tablet 0  . Omega-3 Fatty Acids (FISH OIL) 1200 MG CAPS Take 1,200 mg by mouth 2 (two) times daily.    . pantoprazole (PROTONIX) 40 MG tablet TAKE 1 TABLET BY MOUTH ONCE A DAY. 30 tablet 5  . potassium chloride SA (K-DUR,KLOR-CON) 20 MEQ tablet TAKE 1 TABLET BY MOUTH DAILY. 30 tablet 0  . prasugrel (EFFIENT) 10 MG TABS tablet TAKE 1 TABLET BY MOUTH ONCE A DAY. 30 tablet 10  . PROVENTIL HFA 108 (90 Base) MCG/ACT inhaler INHALE 2 PUFFS INTO THE LUNGS EVERY 6 HOURS AS NEEDED FOR SHORTNESS OF BREATH OR WHEEZING. 18 g 1  . traZODone (DESYREL) 100 MG tablet Take 100 mg by mouth at bedtime.      No current facility-administered medications for this visit.      Past  Medical History:  Diagnosis Date  . Allergy   . Anxiety   . Arthritis   . Asthma    hip replacement  . Back pain   . Bulging of cervical intervertebral disc   . CAD (coronary artery disease)    lateral STEMI 02/06/2015 00% D1 occlusion treated with Promus Premier 2.5 mm x 16 mm DES, 70% ramus stenosis, 40% mid RCA stenosis, 45% distal RCA stenosis, EF 45-50%  . CHF (congestive heart failure) ([place])   . COPD (chronic obstructive pulmonary disease) ([place])   . Depression   . Diabetes mellitus without complication ([place])   . 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 Dry eye   . Dyspnea   . Early satiety 09/23/2016  . GERD (gastroesophageal reflux disease)   . Headache   . Heart murmur   . Hip pain   . Hyperlipidemia   . Hypertension   . MI  (myocardial infarction) ([place])   . Myocardial infarction ([place])   . Neck pain   . Otitis media   . Pleurisy   . Sinus pause    9 sec sinus pause on telemetry after started on coreg after MI, avoid AV nodal blocking agent  . Sleep apnea   . 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 Unilateral primary osteoarthritis, right hip 07/01/2016    ROS:   All systems reviewed and negative except as noted in the HPI.   Past Surgical History:  Procedure Laterality Date  . BIOPSY  10/09/2016   Procedure: BIOPSY;  Surgeon: Daneil Dolin, MD;  [facility];  Service: Endoscopy;;  . CARDIAC CATHETERIZATION N/A 02/06/2015   Procedure: Left Heart Cath and Coronary Angiography;  Surgeon: Leonie Man, MD;  Location: [facility];  Service: Cardiovascular;  Laterality: N/A;  . CARDIAC CATHETERIZATION N/A 02/06/2015   Procedure: Coronary Stent Intervention;  Surgeon: Leonie Man, MD;  Location: [facility];  Service: Cardiovascular;  Laterality: N/A;  . COLONOSCOPY WITH PROPOFOL N/A 10/09/2016   Procedure: COLONOSCOPY WITH PROPOFOL;  Surgeon: Daneil Dolin, MD;  [facility];  Service: Endoscopy;  Laterality: N/A;  12:00 pm  . COLONOSCOPY WITH PROPOFOL N/A 11/24/2016   Procedure: COLONOSCOPY WITH PROPOFOL;  Surgeon: Daneil Dolin, MD;  [facility];  Service: Endoscopy;  Laterality: N/A;  200  . CORONARY ANGIOPLASTY WITH STENT PLACEMENT  01/2015  . CORONARY STENT INTERVENTION N/A 04/27/2017   Procedure: CORONARY STENT INTERVENTION;  Surgeon: Nelva Bush, MD;  Location: [facility];  Service: Cardiovascular;  Laterality: N/A;  . ELECTROPHYSIOLOGY STUDY N/A 06/29/2017   Procedure: ELECTROPHYSIOLOGY STUDY;  Surgeon: Evans Lance, MD;  Location: [facility];  Service: Cardiovascular;  Laterality: N/A;  . ESOPHAGOGASTRODUODENOSCOPY (EGD) WITH PROPOFOL N/A 10/09/2016   Procedure: ESOPHAGOGASTRODUODENOSCOPY (EGD) WITH PROPOFOL;  Surgeon: Daneil Dolin, MD;  [facility];  Service: Endoscopy;  Laterality: N/A;  . INCISION / DRAINAGE HAND / FINGER    . LEFT HEART CATH AND CORONARY ANGIOGRAPHY N/A 04/27/2017   Procedure: LEFT HEART CATH AND CORONARY ANGIOGRAPHY;  Surgeon: Nelva Bush, MD;  Location: [facility];  Service: Cardiovascular;  Laterality: N/A;  . LOOP RECORDER INSERTION  06/29/2017   Procedure: Loop Recorder Insertion;  Surgeon: [place] Lance, MD;  Location: [facility];  Service: Cardiovascular;;  . POLYPECTOMY  11/24/2016   Procedure: POLYPECTOMY;  Surgeon:  Daneil Dolin, MD;  [facility];  Service: Endoscopy;;  descending and sigmoid  . TOTAL HIP ARTHROPLASTY Right 07/01/2016  . TOTAL HIP ARTHROPLASTY Right 07/01/2016   Procedure: RIGHT TOTAL HIP ARTHROPLASTY ANTERIOR APPROACH;  Surgeon: Mcarthur Rossetti, MD;  Location: [facility];  Service: Orthopedics;  Laterality: Right;     Family History  Problem Relation Age of Onset  . Heart attack Father   . Stroke Father   . Arthritis Father   . Heart disease Father   . Cancer Mother        ???  . Arthritis Mother   . Heart disease Brother 91       died in sleep  . Early death Brother   . Diabetes Maternal Uncle   . Alzheimer's disease Maternal Grandmother      Social History   Socioeconomic History  . Marital status: Significant Other    Spouse name: alice  . Number of children: 3  . Years of education: 27  . Highest education level: Not on file  Occupational History  . Occupation: unemployed    Comment: Nature conservation officer    Comment: disability pending  Social Needs  . Financial resource strain: Not on file  . Food insecurity:    Worry: Not on file    Inability: Not on file  . Transportation needs:    Medical: Not on file    Non-medical: Not on file  Tobacco Use  . Smoking status: Current Every Day Smoker    Packs/day: 1.00    Years: 46.00    Pack years: 46.00    Types: Cigarettes    Start date:  06/07/1979  . Smokeless tobacco: Former Systems developer    Types: Chew  Substance and Sexual Activity  . Alcohol use: Yes    Alcohol/week: 6.6 - 7.2 oz    Types: 5 - 6 Shots of liquor, 6 Cans of beer per week    Comment: alcoholic, now drinks intermittently  . Drug use: No    Comment: history of drug use- marijuana, cocaine- quit 2014  . Sexual activity: Yes    Partners: Female    Birth control/protection: None  Lifestyle  . Physical activity:    Days per week: Not on file    Minutes per session: Not on file  . Stress: Not on file  Relationships  . Social connections:    Talks on phone: Not on file    Gets together: Not on file    Attends religious service: Not on file    Active member of club or organization: Not on file    Attends meetings of clubs or organizations: Not on file    Relationship status: Not on file  . Intimate partner violence:    Fear of current or ex partner: Not on file    Emotionally abused: Not on file    Physically abused: Not on file    Forced sexual activity: Not on file  Other Topics Concern  . Not on file  Social History Narrative   Lives with girlfriend [name]   Leisure - TV     BP 124/78 (BP Location: Left Arm)   Pulse 88   Ht 5\' 7"  (1.702 m)   Wt 215 lb (97.5 kg)   SpO2 96%   BMI 33.67 kg/m   Physical Exam:  Well appearing 52 yo man, NAD HEENT: Unremarkable Neck:  6 cm JVD, no thyromegally Lymphatics:  No adenopathy Back:  No CVA tenderness Lungs:  Clear with no wheezes HEART:  Regular rate rhythm, no murmurs, no rubs, no clicks Abd:  soft, positive bowel sounds, no organomegally, no rebound, no guarding Ext:  2 plus pulses, no edema, no cyanosis, no clubbing Skin:  No rashes no nodules Neuro:  CN II through XII intact, motor grossly intact   DEVICE  Normal device function.  See PaceArt for details. Nocturnal bradycardia noted.  Assess/Plan: 1. Unexplained syncope - he has had no episodes for almost a year. He is s/p ILR with no  daytime bradycardia. 2. CAD - he has increased his activity and has not had any angina. 3. Obesity - he has lost 8 lbs. I have asked him to lose more weight. 4. Tobacco abuse - he is still smoking and we discussed ways he might reduce his cigarette use. I have strongly encouraged the patient to stop smoking.  Mikle [email or web address].D.

## 2018-01-21 ENCOUNTER — Other Ambulatory Visit: Payer: Self-pay | Admitting: Family Medicine

## 2018-01-24 ENCOUNTER — Other Ambulatory Visit: Payer: Self-pay | Admitting: Family Medicine

## 2018-01-26 LAB — CUP PACEART REMOTE DEVICE CHECK
Date Time Interrogation Session: 20190506220834
MDC IDC PG IMPLANT DT: 20181029

## 2018-02-05 LAB — CUP PACEART INCLINIC DEVICE CHECK
Date Time Interrogation Session: 20190607173616
MDC IDC PG IMPLANT DT: 20181029

## 2018-02-08 ENCOUNTER — Ambulatory Visit (INDEPENDENT_AMBULATORY_CARE_PROVIDER_SITE_OTHER): Payer: Medicaid Other | Admitting: *Deleted

## 2018-02-08 DIAGNOSIS — R55 Syncope and collapse: Secondary | ICD-10-CM | POA: Diagnosis not present

## 2018-02-08 NOTE — Progress Notes (Signed)
[facility] Summary Report / Loop Recorder 

## 2018-02-19 DIAGNOSIS — F329 Major depressive disorder, single episode, unspecified: Secondary | ICD-10-CM | POA: Diagnosis not present

## 2018-02-19 LAB — COMPLETE METABOLIC PANEL WITH GFR
AG RATIO: 1.5 (calc) (ref 1.0–2.5)
ALBUMIN MSPROF: 4.1 g/dL (ref 3.6–5.1)
ALT: 54 U/L — ABNORMAL HIGH (ref 9–46)
AST: 62 U/L — ABNORMAL HIGH (ref 10–35)
Alkaline phosphatase (APISO): 100 U/L (ref 40–115)
BUN: 11 mg/dL (ref 7–25)
CALCIUM: 9.5 mg/dL (ref 8.6–10.3)
CO2: 28 mmol/L (ref 20–32)
CREATININE: 0.83 mg/dL (ref 0.70–1.33)
Chloride: 102 mmol/L (ref 98–110)
GFR, EST AFRICAN AMERICAN: 116 mL/min/{1.73_m2} (ref >=60)
GFR, EST NON AFRICAN AMERICAN: 100 mL/min/{1.73_m2} (ref >=60)
GLOBULIN: 2.7 g/dL (ref 1.9–3.7)
Glucose, Bld: 133 mg/dL — ABNORMAL HIGH (ref 65–99)
POTASSIUM: 4.1 mmol/L (ref 3.5–5.3)
SODIUM: 139 mmol/L (ref 135–146)
TOTAL PROTEIN: 6.8 g/dL (ref 6.1–8.1)
Total Bilirubin: 0.9 mg/dL (ref 0.2–1.2)

## 2018-02-19 LAB — HEMOGLOBIN A1C
EAG (MMOL/L): 7.6 (calc)
Hgb A1c MFr Bld: 6.4 % of total Hgb — ABNORMAL HIGH (ref <5.7)
MEAN PLASMA GLUCOSE: 137 (calc)

## 2018-02-19 LAB — LIPID PANEL
CHOL/HDL RATIO: 4 (calc) (ref <5.0)
Cholesterol: 99 mg/dL (ref <200)
HDL: 25 mg/dL — AB (ref >40)
LDL Cholesterol (Calc): 46 mg/dL (calc)
NON-HDL CHOLESTEROL (CALC): 74 mg/dL (ref <130)
Triglycerides: 215 mg/dL — ABNORMAL HIGH (ref <150)

## 2018-02-22 ENCOUNTER — Other Ambulatory Visit: Payer: Self-pay | Admitting: Physician Assistant

## 2018-02-22 ENCOUNTER — Other Ambulatory Visit: Payer: Self-pay | Admitting: Cardiology

## 2018-02-22 ENCOUNTER — Other Ambulatory Visit: Payer: Self-pay | Admitting: Family Medicine

## 2018-02-23 NOTE — Telephone Encounter (Signed)
This is a [place] pt.  °

## 2018-02-23 NOTE — Telephone Encounter (Signed)
Pt at [facility], please address thank you.

## 2018-02-25 ENCOUNTER — Encounter: Payer: Self-pay | Admitting: "Endocrinology

## 2018-02-25 ENCOUNTER — Ambulatory Visit (INDEPENDENT_AMBULATORY_CARE_PROVIDER_SITE_OTHER): Payer: Medicare Other | Admitting: "Endocrinology

## 2018-02-25 VITALS — BP 122/86 | HR 90 | Ht 67.0 in | Wt 210.0 lb

## 2018-02-25 DIAGNOSIS — I25110 Atherosclerotic heart disease of native coronary artery with unstable angina pectoris: Secondary | ICD-10-CM | POA: Diagnosis not present

## 2018-02-25 DIAGNOSIS — E782 Mixed hyperlipidemia: Secondary | ICD-10-CM | POA: Diagnosis not present

## 2018-02-25 DIAGNOSIS — E1159 Type 2 diabetes mellitus with other circulatory complications: Secondary | ICD-10-CM | POA: Diagnosis not present

## 2018-02-25 DIAGNOSIS — Z6836 Body mass index (BMI) 36.0-36.9, adult: Secondary | ICD-10-CM | POA: Diagnosis not present

## 2018-02-25 MED ORDER — ATORVASTATIN CALCIUM 40 MG PO TABS: 40.0000 mg | ORAL_TABLET | Freq: Every day | ORAL | 6 refills | Status: DC

## 2018-02-25 NOTE — Patient Instructions (Signed)

## 2018-02-25 NOTE — Progress Notes (Signed)
Endocrinology follow-up note       02/25/2018, 1:03 PM   Subjective:    Patient ID: Randy Hebert, male    DOB: 1965-11-12.  Randy Hebert is being seen in follow-up for management of currently uncontrolled symptomatic diabetes requested by  Randy Fire, MD.   Past Medical History:  Diagnosis Date  . Allergy   . Anxiety   . Arthritis   . Asthma    hip replacement  . Back pain   . Bulging of cervical intervertebral disc   . CAD (coronary artery disease)    lateral STEMI 02/06/2015 00% D1 occlusion treated with Promus Premier 2.5 mm x 16 mm DES, 70% ramus stenosis, 40% mid RCA stenosis, 45% distal RCA stenosis, EF 45-50%  . CHF (congestive heart failure) ([place])   . COPD (chronic obstructive pulmonary disease) ([place])   . Depression   . Diabetes mellitus without complication ([place])   . 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 Dry eye   . Dyspnea   . Early satiety 09/23/2016  . GERD (gastroesophageal reflux disease)   . Headache   . Heart murmur   . Hip pain   . Hyperlipidemia   . Hypertension   . MI (myocardial infarction) ([place])   . Myocardial infarction ([place])   . Neck pain   . Otitis media   . Pleurisy   . Sinus pause    9 sec sinus pause on telemetry after started on coreg after MI, avoid AV nodal blocking agent  . Sleep apnea   . 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 Unilateral primary osteoarthritis, right hip 07/01/2016   Past Surgical History:  Procedure Laterality Date  . BIOPSY  10/09/2016   Procedure: BIOPSY;  Surgeon: Daneil Dolin, MD;  [facility];  Service: Endoscopy;;  . CARDIAC CATHETERIZATION N/A 02/06/2015   Procedure: Left Heart Cath and Coronary Angiography;  Surgeon: Leonie Man, MD;  Location: [facility];  Service: Cardiovascular;  Laterality: N/A;  . CARDIAC CATHETERIZATION N/A 02/06/2015   Procedure:  Coronary Stent Intervention;  Surgeon: Leonie Man, MD;  Location: [facility];  Service: Cardiovascular;  Laterality: N/A;  . COLONOSCOPY WITH PROPOFOL N/A 10/09/2016   Procedure: COLONOSCOPY WITH PROPOFOL;  Surgeon: Daneil Dolin, MD;  [facility];  Service: Endoscopy;  Laterality: N/A;  12:00 pm  . COLONOSCOPY WITH PROPOFOL N/A 11/24/2016   Procedure: COLONOSCOPY WITH PROPOFOL;  Surgeon: Daneil Dolin, MD;  [facility];  Service: Endoscopy;  Laterality: N/A;  200  . CORONARY ANGIOPLASTY WITH STENT PLACEMENT  01/2015  . CORONARY STENT INTERVENTION N/A 04/27/2017   Procedure: CORONARY STENT INTERVENTION;  Surgeon: Nelva Bush, MD;  Location: [facility];  Service: Cardiovascular;  Laterality: N/A;  . ELECTROPHYSIOLOGY STUDY N/A 06/29/2017   Procedure: ELECTROPHYSIOLOGY STUDY;  Surgeon: Evans Lance, MD;  Location: [facility];  Service: Cardiovascular;  Laterality: N/A;  . ESOPHAGOGASTRODUODENOSCOPY (EGD) WITH PROPOFOL N/A 10/09/2016   Procedure: ESOPHAGOGASTRODUODENOSCOPY (EGD) WITH PROPOFOL;  Surgeon: Daneil Dolin,  MD;  [facility];  Service: Endoscopy;  Laterality: N/A;  . INCISION / DRAINAGE HAND / FINGER    . LEFT HEART CATH AND CORONARY ANGIOGRAPHY N/A 04/27/2017   Procedure: LEFT HEART CATH AND CORONARY ANGIOGRAPHY;  Surgeon: Nelva Bush, MD;  Location: [facility];  Service: Cardiovascular;  Laterality: N/A;  . LOOP RECORDER INSERTION  06/29/2017   Procedure: Loop Recorder Insertion;  Surgeon: Evans Lance, MD;  Location: [facility];  Service: Cardiovascular;;  . POLYPECTOMY  11/24/2016   Procedure: POLYPECTOMY;  Surgeon: Daneil Dolin, MD;  [facility];  Service: Endoscopy;;  descending and sigmoid  . TOTAL HIP ARTHROPLASTY Right 07/01/2016  . TOTAL HIP ARTHROPLASTY Right 07/01/2016   Procedure: RIGHT TOTAL HIP ARTHROPLASTY ANTERIOR APPROACH;  Surgeon: Mcarthur Rossetti, MD;   Location: [facility];  Service: Orthopedics;  Laterality: Right;   Social History   Socioeconomic History  . Marital status: Significant Other    Spouse name: Randy Hebert  . Number of children: 3  . Years of education: 37  . Highest education level: Not on file  Occupational History  . Occupation: unemployed    Comment: Nature conservation officer    Comment: disability pending  Social Needs  . Financial resource strain: Not on file  . Food insecurity:    Worry: Not on file    Inability: Not on file  . Transportation needs:    Medical: Not on file    Non-medical: Not on file  Tobacco Use  . Smoking status: Current Every Day Smoker    Packs/day: 1.00    Years: 46.00    Pack years: 46.00    Types: Cigarettes    Start date: 06/07/1979  . Smokeless tobacco: Former Systems developer    Types: Chew  Substance and Sexual Activity  . Alcohol use: Yes    Alcohol/week: 6.6 - 7.2 oz    Types: 5 - 6 Shots of liquor, 6 Cans of beer per week    Comment: alcoholic, now drinks intermittently  . Drug use: No    Comment: history of drug use- marijuana, cocaine- quit 2014  . Sexual activity: Yes    Partners: Female    Birth control/protection: None  Lifestyle  . Physical activity:    Days per week: Not on file    Minutes per session: Not on file  . Stress: Not on file  Relationships  . Social connections:    Talks on phone: Not on file    Gets together: Not on file    Attends religious service: Not on file    Active member of club or organization: Not on file    Attends meetings of clubs or organizations: Not on file    Relationship status: Not on file  Other Topics Concern  . Not on file  Social History Narrative   Lives with girlfriend [name]   Leisure - TV   Outpatient Encounter Medications as of 02/25/2018  Medication Sig  . acetaminophen (TYLENOL) 325 MG tablet Take 650 mg by mouth every 6 (six) hours as needed for mild pain.  Marland Kitchen albuterol (PROVENTIL) (2.5 MG/3ML) 0.083% nebulizer solution Take 3 mLs  (2.5 mg total) by nebulization every 6 (six) hours as needed for wheezing or shortness of breath.  Marland Kitchen aspirin 81 MG EC tablet Take 1 tablet (81 mg total) by mouth daily.  Marland Kitchen atorvastatin (LIPITOR) 40 MG tablet Take 1 tablet (40 mg total) by mouth daily.  . Carboxymeth-Glycerin-Polysorb (REFRESH OPTIVE  ADVANCED) 0.5-1-0.5 % SOLN Apply 1 drop to eye 2 (two) times daily as needed (dry eyes).   . citalopram (CELEXA) 20 MG tablet Take 20 mg by mouth daily.  . cycloSPORINE (RESTASIS) 0.05 % ophthalmic emulsion Place 1 drop into both eyes 2 (two) times daily.  . fluticasone (FLONASE) 50 MCG/ACT nasal spray Place 2 sprays into both nostrils daily.  . furosemide (LASIX) 40 MG tablet TAKE 1 TABLET BY MOUTH ONCE DAILY.  Marland Kitchen glucose blood (IGLUCOSE TEST STRIPS) test strip 1 each by Other route 2 (two) times daily. Use as instructed  . Magnesium 200 MG TABS Take 1 tablet (200 mg total) by mouth daily.  . metFORMIN (GLUCOPHAGE) 1000 MG tablet Take 1 tablet (1,000 mg total) by mouth 2 (two) times daily with a meal.  . mometasone-formoterol (DULERA) 100-5 MCG/ACT AERO Inhale 2 puffs into the lungs 2 (two) times daily.  . nicotine (NICODERM CQ - DOSED IN MG/24 HOURS) 21 mg/24hr patch Place 1 patch (21 mg total) onto the skin daily. (Patient taking differently: Place 21 mg onto the skin daily. )  . nitroGLYCERIN (NITROSTAT) 0.4 MG SL tablet Place 1 tablet (0.4 mg total) under the tongue every 5 (five) minutes as needed for chest pain.  . Omega-3 Fatty Acids (FISH OIL) 1200 MG CAPS Take 1,200 mg by mouth 2 (two) times daily.  . pantoprazole (PROTONIX) 40 MG tablet TAKE 1 TABLET BY MOUTH ONCE A DAY.  Marland Kitchen potassium chloride SA (K-DUR,KLOR-CON) 20 MEQ tablet TAKE 1 TABLET BY MOUTH DAILY.  . prasugrel (EFFIENT) 10 MG TABS tablet TAKE 1 TABLET BY MOUTH ONCE A DAY.  Marland Kitchen PROVENTIL HFA 108 (90 Base) MCG/ACT inhaler INHALE 2 PUFFS INTO THE LUNGS EVERY 6 HOURS AS NEEDED FOR SHORTNESS OF BREATH OR WHEEZING.  . traZODone (DESYREL) 100  MG tablet Take 100 mg by mouth at bedtime.   . [DISCONTINUED] atorvastatin (LIPITOR) 80 MG tablet TAKE 1 TABLET BY MOUTH ONCE A DAY.   No facility-administered encounter medications on file as of 02/25/2018.     ALLERGIES: Allergies  Allergen Reactions  . Carvedilol Other (See Comments)    Sinus pause on telemetry >3 seconds. Longest one 9 sec. No AV nodal agent  . Lisinopril Anaphylaxis, Shortness Of Breath and Swelling    Angioedema, required intubation and mechanical ventilation  . Amoxicillin Nausea And Vomiting    Nausea only - no allergy    VACCINATION STATUS: Immunization History  Administered Date(s) Administered  . Influenza,inj,Quad PF,6+ Mos 07/03/2016, 05/07/2017  . Pneumococcal Conjugate-13 06/17/2017  . Pneumococcal Polysaccharide-23 02/07/2015    Diabetes  He presents for his follow-up diabetic visit. He has type 2 diabetes mellitus. Onset time: He was diagnosed at approximate age of 13 years. His disease course has been stable. There are no hypoglycemic associated symptoms. Pertinent negatives for hypoglycemia include no confusion, headaches, pallor or seizures. Pertinent negatives for diabetes include no chest pain, no fatigue, no polydipsia, no polyphagia, no polyuria and no weakness. There are no hypoglycemic complications. Symptoms are improving. Diabetic complications include heart disease. Risk factors for coronary artery disease include dyslipidemia, diabetes mellitus, hypertension, male sex, family history, obesity, sedentary lifestyle and tobacco exposure. Current diabetic treatment includes oral agent (monotherapy). He is compliant with treatment most of the time. His weight is fluctuating minimally. He is following a generally unhealthy diet. When asked about meal planning, he reported none. He has had a previous visit with a dietitian. He never participates in exercise. His overall blood glucose range is  140-180 mg/dl. An ACE inhibitor/angiotensin II receptor  blocker is contraindicated (He has documented allergy for ACE inhibitor's). He does not see a podiatrist.Eye exam is not current.  Hyperlipidemia  This is a chronic problem. The current episode started more than 1 year ago. Exacerbating diseases include diabetes and obesity. Pertinent negatives include no chest pain, myalgias or shortness of breath. Current antihyperlipidemic treatment includes statins. Risk factors for coronary artery disease include dyslipidemia, diabetes mellitus, family history, obesity, male sex, hypertension and a sedentary lifestyle.    Review of Systems  Constitutional: Negative for chills, fatigue, fever and unexpected weight change.  HENT: Negative for dental problem, mouth sores and trouble swallowing.   Eyes: Negative for visual disturbance.  Respiratory: Negative for cough, choking, chest tightness, shortness of breath and wheezing.   Cardiovascular: Negative for chest pain, palpitations and leg swelling.  Gastrointestinal: Negative for abdominal distention, abdominal pain, constipation, diarrhea, nausea and vomiting.  Endocrine: Negative for polydipsia, polyphagia and polyuria.  Genitourinary: Negative for dysuria, flank pain, hematuria and urgency.  Musculoskeletal: Negative for back pain, gait problem, myalgias and neck pain.  Skin: Negative for pallor, rash and wound.  Neurological: Negative for seizures, syncope, weakness, numbness and headaches.  Psychiatric/Behavioral: Negative.  Negative for confusion and dysphoric mood.    Objective:    BP 122/86   Pulse 90   Ht 5\' 7"  (1.702 m)   Wt 210 lb (95.3 kg)   BMI 32.89 kg/m   Wt Readings from Last 3 Encounters:  02/25/18 210 lb (95.3 kg)  01/19/18 215 lb (97.5 kg)  12/28/17 215 lb (97.5 kg)     Physical Exam  Constitutional: He is oriented to person, place, and time. He appears well-developed. He is cooperative. No distress.  HENT:  Head: Normocephalic and atraumatic.  Eyes: EOM are normal.  Neck:  Normal range of motion. Neck supple. No tracheal deviation present. No thyromegaly present.  Cardiovascular: Normal rate, S1 normal and S2 normal. Exam reveals no gallop.  No murmur heard. Pulses:      Dorsalis pedis pulses are 1+ on the right side, and 1+ on the left side.       Posterior tibial pulses are 1+ on the right side, and 1+ on the left side.  Pulmonary/Chest: Effort normal. No respiratory distress. He has no wheezes.  Abdominal: He exhibits no distension. There is no tenderness. There is no guarding and no CVA tenderness.  Musculoskeletal: He exhibits no edema.       Right shoulder: He exhibits no swelling and no deformity.  Neurological: He is alert and oriented to person, place, and time. He has normal strength. No cranial nerve deficit or sensory deficit. Gait normal.  Skin: Skin is warm and dry. No rash noted. No cyanosis. Nails show no clubbing.  Psychiatric: He has a normal mood and affect. His speech is normal. Cognition and memory are normal.    CMP     Component Value Date/Time   NA 139 02/18/2018 0717   K 4.1 02/18/2018 0717   CL 102 02/18/2018 0717   CO2 28 02/18/2018 0717   GLUCOSE 133 (H) 02/18/2018 0717   BUN 11 02/18/2018 0717   CREATININE 0.83 02/18/2018 0717   CALCIUM 9.5 02/18/2018 0717   PROT 6.8 02/18/2018 0717   ALBUMIN 3.7 04/15/2017 0927   AST 62 (H) 02/18/2018 0717   ALT 54 (H) 02/18/2018 0717   ALKPHOS 98 04/15/2017 0927   BILITOT 0.9 02/18/2018 0717   GFRNONAA 100 02/18/2018 0717  GFRAA 116 02/18/2018 0717    Diabetic Labs (most recent): Lab Results  Component Value Date   HGBA1C 6.4 (H) 02/18/2018   HGBA1C 6.5 (H) 11/17/2017   HGBA1C 7.6 (H) 04/15/2017     Lipid Panel ( most recent) Lipid Panel     Component Value Date/Time   CHOL 99 02/18/2018 0717   TRIG 215 (H) 02/18/2018 0717   HDL 25 (L) 02/18/2018 0717   CHOLHDL 4.0 02/18/2018 0717   VLDL 41 (H) 04/27/2017 0505   LDLCALC 46 02/18/2018 0717      Lab Results   Component Value Date   TSH 1.79 11/17/2017   TSH 1.635 04/26/2017   TSH 2.564 06/27/2015   FREET4 1.4 11/17/2017      Assessment & Plan:   1. DM type 2 causing vascular disease ([place])  - Randy Hebert has currently uncontrolled symptomatic type 2 DM since  52 years of age. -He continues to improve his glucose profile to near target.  He returns with A1c of 6.4%, improving from 7.6%.   -his diabetes is complicated by  coronary artery disease which required stent placement, obesity/sedentary life, chronic heavy smoking, and VALLIE TETERS remains at a high risk for more acute and chronic complications which include CAD, CVA, CKD, retinopathy, and neuropathy. These are all discussed in detail with the patient.  - I have counseled him on diet management and weight loss, by adopting a carbohydrate restricted/protein rich diet.  -  Suggestion is made for him to avoid simple carbohydrates  from his diet including Cakes, Sweet Desserts / Pastries, Ice Cream, Soda (diet and regular), Sweet Tea, Candies, Chips, Cookies, Store Bought Juices, Alcohol in Excess of  1-2 drinks a day, Artificial Sweeteners, and "Sugar-free" Products. This will help patient to have stable blood glucose profile and potentially avoid unintended weight gain.  - I encouraged him to switch to  unprocessed or minimally processed complex starch and increased protein intake (animal or plant source), fruits, and vegetables.  - he is advised to stick to a routine mealtimes to eat 3 meals  a day and avoid unnecessary snacks ( to snack only to correct hypoglycemia).    - I have approached him with the following individualized plan to manage diabetes and patient agrees:   - He continues  to be motivated.   -He has achieved control of diabetes without insulin.   -Emphasizing the need for him to stay engaged, I advised him to continue metformin 1000 mg p.o. twice daily, therapeutically suitable for the patient.    - Patient  specific target  A1c;  LDL, HDL, Triglycerides, and  Waist Circumference were discussed in detail.  2) BP/HTN: His blood pressure is controlled to target.  Patient has documented allergy to ACE inhibitor's . He is on diuretics, he would be considered for calcium channel blockers during his next visit if his BP  remains above target.  3) Lipids/HPL: Recent lipid panel showed controlled LDL at 46, triglycerides still high at 215.  He is advised to avoid butter and fried food.  Due to elevated liver enzymes (which are mainly due to his alcohol abuse) I advised him to lower his atorvastatin to 40 mg from 80 mg.  Better control of diabetes would likely lead to better control of triglycerides.     4)  Weight/Diet: CDE Consult will be initiated , exercise, and detailed carbohydrates information provided.  5) Chronic Care/Health Maintenance:  -he  is on Statin medications and  is  encouraged to continue to follow up with Ophthalmology, Dentist,  Podiatrist at least yearly or according to recommendations, and advised to  Quit smoking ( is a chronic heavy smoker for the last 40 days). I have recommended yearly flu vaccine and pneumonia vaccination at least every 5 years; moderate intensity exercise for up to 150 minutes weekly; and  sleep for at least 7 hours a day.  - I advised patient to maintain close follow up with Randy Fire, MD for primary care needs.   - Time spent with the patient: 25 min, of which >50% was spent in reviewing his blood glucose logs , discussing his hypo- and hyper-glycemic episodes, reviewing his current and  previous labs and insulin doses and developing a plan to avoid hypo- and hyper-glycemia. Please refer to Patient Instructions for Blood Glucose Monitoring and Insulin/Medications Dosing Guide"  in media tab for additional information. Randy Hebert participated in the discussions, expressed understanding, and voiced agreement with the above plans.  All questions were  answered to his satisfaction. he is encouraged to contact clinic should he have any questions or concerns prior to his return visit.   Follow up plan: - Return in about 4 months (around 06/27/2018) for follow up with pre-visit labs.  Glade Lloyd, MD [facility] [address] [place],  [address] Phone: [phone]  Fax: [phone]    02/25/2018, 1:03 PM  This note was partially dictated with voice recognition software. Similar sounding words can be transcribed inadequately or may not  be corrected upon review.

## 2018-02-26 ENCOUNTER — Telehealth: Payer: Self-pay | Admitting: Cardiology

## 2018-02-26 NOTE — Telephone Encounter (Signed)
Patient is requesting call due to PCP decreasing his Atorvastatin. / tg

## 2018-02-26 NOTE — Telephone Encounter (Signed)
Dr Dorris Fetch reduced his Atorvastatin from 80 mg to 40 mg due to elevated AST/ALT. Patient wants you to know and wants to know if you concur.

## 2018-03-01 NOTE — Telephone Encounter (Signed)
Patient notified that Dr.branch is ok atorvastatin was reduced

## 2018-03-01 NOTE — Telephone Encounter (Signed)
Im ok with that change. Please document phone note in chart so we have in his records why change was made.    [name] MD

## 2018-03-05 ENCOUNTER — Emergency Department (HOSPITAL_COMMUNITY): Payer: Medicare Other

## 2018-03-05 ENCOUNTER — Encounter (HOSPITAL_COMMUNITY): Payer: Self-pay | Admitting: *Deleted

## 2018-03-05 ENCOUNTER — Emergency Department (HOSPITAL_COMMUNITY)
Admission: EM | Admit: 2018-03-05 | Discharge: 2018-03-05 | Disposition: A | Discharge status: Home / Self Care | Payer: Medicare Other | Attending: Emergency Medicine | Admitting: Emergency Medicine

## 2018-03-05 ENCOUNTER — Other Ambulatory Visit: Payer: Self-pay

## 2018-03-05 DIAGNOSIS — Z79899 Other long term (current) drug therapy: Secondary | ICD-10-CM | POA: Insufficient documentation

## 2018-03-05 DIAGNOSIS — I110 Hypertensive heart disease with heart failure: Secondary | ICD-10-CM | POA: Diagnosis not present

## 2018-03-05 DIAGNOSIS — I2510 Atherosclerotic heart disease of native coronary artery without angina pectoris: Secondary | ICD-10-CM | POA: Diagnosis not present

## 2018-03-05 DIAGNOSIS — I252 Old myocardial infarction: Secondary | ICD-10-CM | POA: Diagnosis not present

## 2018-03-05 DIAGNOSIS — F17210 Nicotine dependence, cigarettes, uncomplicated: Secondary | ICD-10-CM | POA: Insufficient documentation

## 2018-03-05 DIAGNOSIS — Z720 Tobacco use: Secondary | ICD-10-CM

## 2018-03-05 DIAGNOSIS — E119 Type 2 diabetes mellitus without complications: Secondary | ICD-10-CM | POA: Diagnosis not present

## 2018-03-05 DIAGNOSIS — E785 Hyperlipidemia, unspecified: Secondary | ICD-10-CM | POA: Insufficient documentation

## 2018-03-05 DIAGNOSIS — Z7982 Long term (current) use of aspirin: Secondary | ICD-10-CM | POA: Diagnosis not present

## 2018-03-05 DIAGNOSIS — Z7984 Long term (current) use of oral hypoglycemic drugs: Secondary | ICD-10-CM | POA: Diagnosis not present

## 2018-03-05 DIAGNOSIS — I5042 Chronic combined systolic (congestive) and diastolic (congestive) heart failure: Secondary | ICD-10-CM | POA: Diagnosis not present

## 2018-03-05 DIAGNOSIS — R042 Hemoptysis: Secondary | ICD-10-CM | POA: Diagnosis not present

## 2018-03-05 DIAGNOSIS — J209 Acute bronchitis, unspecified: Secondary | ICD-10-CM | POA: Diagnosis not present

## 2018-03-05 DIAGNOSIS — I10 Essential (primary) hypertension: Secondary | ICD-10-CM | POA: Diagnosis not present

## 2018-03-05 DIAGNOSIS — R079 Chest pain, unspecified: Secondary | ICD-10-CM | POA: Diagnosis not present

## 2018-03-05 DIAGNOSIS — J449 Chronic obstructive pulmonary disease, unspecified: Secondary | ICD-10-CM | POA: Diagnosis not present

## 2018-03-05 DIAGNOSIS — R05 Cough: Secondary | ICD-10-CM | POA: Diagnosis not present

## 2018-03-05 LAB — CBG MONITORING, ED: Glucose-Capillary: 148 mg/dL — ABNORMAL HIGH (ref 70–99)

## 2018-03-05 MED ORDER — PREDNISONE 50 MG PO TABS
60.0000 mg | ORAL_TABLET | Freq: Once | ORAL | Status: AC
  Administered 2018-03-05: 60 mg via ORAL
  Filled 2018-03-05: qty 1

## 2018-03-05 MED ORDER — DOXYCYCLINE HYCLATE 100 MG PO TABS
100.0000 mg | ORAL_TABLET | Freq: Once | ORAL | Status: AC
  Administered 2018-03-05: 100 mg via ORAL
  Filled 2018-03-05: qty 1

## 2018-03-05 MED ORDER — DOXYCYCLINE HYCLATE 100 MG PO CAPS: 100.0000 mg | ORAL_CAPSULE | Freq: Two times a day (BID) | ORAL | 0 refills | Status: DC

## 2018-03-05 MED ORDER — ALBUTEROL SULFATE (2.5 MG/3ML) 0.083% IN NEBU
5.0000 mg | INHALATION_SOLUTION | Freq: Once | RESPIRATORY_TRACT | Status: AC
  Administered 2018-03-05: 5 mg via RESPIRATORY_TRACT
  Filled 2018-03-05: qty 6

## 2018-03-05 MED ORDER — PREDNISONE 20 MG PO TABS: 20.0000 mg | ORAL_TABLET | Freq: Two times a day (BID) | ORAL | 0 refills | Status: DC

## 2018-03-05 NOTE — Discharge Instructions (Addendum)
Try to stop smoking.  Use your albuterol inhaler as needed for cough or trouble breathing.  Start the prednisone prescription tomorrow.

## 2018-03-05 NOTE — ED Notes (Signed)
Respiratory paged at this time for tx.

## 2018-03-05 NOTE — ED Triage Notes (Signed)
Patient with cough with some noted blood mixed with mucous that began 03030 this morning.  Patient denies night sweats, unintentional weight loss or fever.  Patient denies exposure to anyone with illnesses.

## 2018-03-05 NOTE — ED Provider Notes (Signed)
[facility] EMERGENCY DEPARTMENT Provider Note   CSN: [id number] Arrival date & time: 03/05/18  1013     History   Chief Complaint Chief Complaint  Patient presents with  . Cough    with blood    HPI Randy Hebert is a 52 y.o. male.  HPI   He presents for evaluation of cough with mucus and blood in it starting several hours ago.  This is occurred multiple times.  He denies associated fever, chills, weakness or dizziness.  He has no chest tightness.  He has an albuterol inhaler at home, for rescue purposes but did not use it no other recent illnesses.  He continues to smoke cigarettes.  There are no other known modifying factors.  Past Medical History:  Diagnosis Date  . Allergy   . Anxiety   . Arthritis   . Asthma    hip replacement  . Back pain   . Bulging of cervical intervertebral disc   . CAD (coronary artery disease)    lateral STEMI 02/06/2015 00% D1 occlusion treated with Promus Premier 2.5 mm x 16 mm DES, 70% ramus stenosis, 40% mid RCA stenosis, 45% distal RCA stenosis, EF 45-50%  . CHF (congestive heart failure) ([place])   . COPD (chronic obstructive pulmonary disease) ([place])   . Depression   . Diabetes mellitus without complication ([place])   . 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 Dry eye   . Dyspnea   . Early satiety 09/23/2016  . GERD (gastroesophageal reflux disease)   . Headache   . Heart murmur   . Hip pain   . Hyperlipidemia   . Hypertension   . MI (myocardial infarction) ([place])   . Myocardial infarction ([place])   . Neck pain   . Otitis media   . Pleurisy   . Sinus pause    9 sec sinus pause on telemetry after started on coreg after MI, avoid AV nodal blocking agent  . Sleep apnea   . 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 Unilateral primary osteoarthritis, right hip 07/01/2016    Patient Active Problem List   Diagnosis Date Noted  . DM type 2 causing vascular disease ([place]) 08/17/2017  . Noncompliance  07/10/2017  . Syncope 06/29/2017  . HCAP (healthcare-associated pneumonia) 05/15/2017  . Uncontrolled type 2 diabetes mellitus with hyperglycemia, without long-term current use of insulin ([place]) 05/15/2017  . Acute on chronic respiratory failure with hypoxia ([place]) 05/15/2017  . Lobar pneumonia ([place]) 05/15/2017  . Chronic combined systolic and diastolic CHF (congestive heart failure) ([place]) 05/15/2017  . Sinus pause 05/15/2017  . Syncope and collapse 05/15/2017  . Bradycardia 04/28/2017  . Sleep apnea 04/28/2017  . Non-ST elevation (NSTEMI) myocardial infarction ([place]) 04/27/2017  . NSTEMI (non-ST elevated myocardial infarction) ([place]) 04/26/2017  . Substance abuse ([place]) 04/15/2017  . Depression with anxiety 04/15/2017  . GERD (gastroesophageal reflux disease) 09/23/2016  . 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 Chronic obstructive pulmonary disease ([place]) 08/27/2015  . Cigarette nicotine dependence with nicotine-induced disorder 08/27/2015  . Hyperlipidemia 08/27/2015  . Class 2 severe obesity due to excess calories with serious comorbidity and body mass index (BMI) of 36.0 to 36.9 in adult [facility]) 08/27/2015  . Acute on chronic systolic heart failure ([place]) 06/02/2015  . ACE inhibitor-aggravated angioedema   . Chronic systolic CHF (congestive heart failure) ([place]) 06/01/2015  . CAD (coronary artery disease) 05/31/2015  . STEMI (ST  elevation myocardial infarction) ([place]) 05/31/2015  . Tobacco use disorder 05/31/2015  . Essential hypertension 02/09/2015    Past Surgical History:  Procedure Laterality Date  . BIOPSY  10/09/2016   Procedure: BIOPSY;  Surgeon: Daneil Dolin, MD;  [facility];  Service: Endoscopy;;  . CARDIAC CATHETERIZATION N/A 02/06/2015   Procedure: Left Heart Cath and Coronary Angiography;  Surgeon: Leonie Man, MD;  Location: [place] CV LAB;  Service: Cardiovascular;  Laterality: N/A;  . CARDIAC CATHETERIZATION N/A 02/06/2015   Procedure:  Coronary Stent Intervention;  Surgeon: Leonie Man, MD;  Location: [place] CV LAB;  Service: Cardiovascular;  Laterality: N/A;  . COLONOSCOPY WITH PROPOFOL N/A 10/09/2016   Procedure: COLONOSCOPY WITH PROPOFOL;  Surgeon: Daneil Dolin, MD;  [facility];  Service: Endoscopy;  Laterality: N/A;  12:00 pm  . COLONOSCOPY WITH PROPOFOL N/A 11/24/2016   Procedure: COLONOSCOPY WITH PROPOFOL;  Surgeon: Daneil Dolin, MD;  [facility];  Service: Endoscopy;  Laterality: N/A;  200  . CORONARY ANGIOPLASTY WITH STENT PLACEMENT  01/2015  . CORONARY STENT INTERVENTION N/A 04/27/2017   Procedure: CORONARY STENT INTERVENTION;  Surgeon: Nelva Bush, MD;  Location: [place] CV LAB;  Service: Cardiovascular;  Laterality: N/A;  . ELECTROPHYSIOLOGY STUDY N/A 06/29/2017   Procedure: ELECTROPHYSIOLOGY STUDY;  Surgeon: Evans Lance, MD;  Location: [place] CV LAB;  Service: Cardiovascular;  Laterality: N/A;  . ESOPHAGOGASTRODUODENOSCOPY (EGD) WITH PROPOFOL N/A 10/09/2016   Procedure: ESOPHAGOGASTRODUODENOSCOPY (EGD) WITH PROPOFOL;  Surgeon: Daneil Dolin, MD;  [facility];  Service: Endoscopy;  Laterality: N/A;  . INCISION / DRAINAGE HAND / FINGER    . LEFT HEART CATH AND CORONARY ANGIOGRAPHY N/A 04/27/2017   Procedure: LEFT HEART CATH AND CORONARY ANGIOGRAPHY;  Surgeon: Nelva Bush, MD;  Location: [place] CV LAB;  Service: Cardiovascular;  Laterality: N/A;  . LOOP RECORDER INSERTION  06/29/2017   Procedure: Loop Recorder Insertion;  Surgeon: Evans Lance, MD;  Location: [place] CV LAB;  Service: Cardiovascular;;  . POLYPECTOMY  11/24/2016   Procedure: POLYPECTOMY;  Surgeon: Daneil Dolin, MD;  [facility];  Service: Endoscopy;;  descending and sigmoid  . TOTAL HIP ARTHROPLASTY Right 07/01/2016  . TOTAL HIP ARTHROPLASTY Right 07/01/2016   Procedure: RIGHT TOTAL HIP ARTHROPLASTY ANTERIOR APPROACH;  Surgeon: Mcarthur Rossetti, MD;   Location: [place];  Service: Orthopedics;  Laterality: Right;        Home Medications    Prior to Admission medications   Medication Sig Start Date End Date Taking? Authorizing Provider  albuterol (PROVENTIL) (2.5 MG/3ML) 0.083% nebulizer solution Take 3 mLs (2.5 mg total) by nebulization every 6 (six) hours as needed for wheezing or shortness of breath. 05/27/16  Yes Soyla Dryer, PA-C  aspirin 81 MG EC tablet Take 1 tablet (81 mg total) by mouth daily. 04/29/17  Yes Cheryln Manly, NP  atorvastatin (LIPITOR) 40 MG tablet Take 1 tablet (40 mg total) by mouth daily. 02/25/18  Yes Nida, Marella Chimes, MD  Carboxymeth-Glycerin-Polysorb (REFRESH OPTIVE ADVANCED) 0.5-1-0.5 % SOLN Apply 1 drop to eye 2 (two) times daily as needed (dry eyes).    Yes [provider]  citalopram (CELEXA) 20 MG tablet Take 20 mg by mouth daily. 11/06/17  Yes [provider]  cycloSPORINE (RESTASIS) 0.05 % ophthalmic emulsion Place 1 drop into both eyes 2 (two) times daily.   Yes [provider]  fluticasone (FLONASE) 50 MCG/ACT nasal spray  Place 2 sprays into both nostrils daily. 09/18/17  Yes Raylene Everts, MD  furosemide (LASIX) 40 MG tablet TAKE 1 TABLET BY MOUTH ONCE DAILY. 02/23/18  Yes Evans Lance, MD  Magnesium 200 MG TABS Take 1 tablet (200 mg total) by mouth daily. 03/06/17  Yes Lendon Colonel, NP  metFORMIN (GLUCOPHAGE) 1000 MG tablet Take 1 tablet (1,000 mg total) by mouth 2 (two) times daily with a meal. 04/15/17  Yes Raylene Everts, MD  mometasone-formoterol [facility]) 100-5 MCG/ACT AERO Inhale 2 puffs into the lungs 2 (two) times daily. 06/17/17  Yes Raylene Everts, MD  nicotine (NICODERM CQ - DOSED IN MG/24 HOURS) 21 mg/24hr patch Place 1 patch (21 mg total) onto the skin daily. Patient taking differently: Place 21 mg onto the skin daily.  04/15/17  Yes Raylene Everts, MD  nitroGLYCERIN (NITROSTAT) 0.4 MG SL tablet Place 1 tablet (0.4 mg total) under the  tongue every 5 (five) minutes as needed for chest pain. 10/21/17  Yes Raylene Everts, MD  pantoprazole (PROTONIX) 40 MG tablet TAKE 1 TABLET BY MOUTH ONCE A DAY. 12/17/17  Yes Annitta Needs, NP  potassium chloride SA (K-DUR,KLOR-CON) 20 MEQ tablet TAKE 1 TABLET BY MOUTH DAILY. 12/17/17  Yes Raylene Everts, MD  prasugrel (EFFIENT) 10 MG TABS tablet TAKE 1 TABLET BY MOUTH ONCE A DAY. 10/08/17  Yes Evans Lance, MD  PROVENTIL HFA 108 [phone] Base) MCG/ACT inhaler INHALE 2 PUFFS INTO THE LUNGS EVERY 6 HOURS AS NEEDED FOR SHORTNESS OF BREATH OR WHEEZING. 08/13/17  Yes Raylene Everts, MD  traZODone (DESYREL) 100 MG tablet Take 100 mg by mouth at bedtime.    Yes [provider]  acetaminophen (TYLENOL) 325 MG tablet Take 650 mg by mouth every 6 (six) hours as needed for mild pain.    [provider]  doxycycline (VIBRAMYCIN) 100 MG capsule Take 1 capsule (100 mg total) by mouth 2 (two) times daily. 03/05/18   Daleen Bo, MD  glucose blood (IGLUCOSE TEST STRIPS) test strip 1 each by Other route 2 (two) times daily. Use as instructed 07/31/17   Raylene Everts, MD  predniSONE (DELTASONE) 20 MG tablet Take 1 tablet (20 mg total) by mouth 2 (two) times daily. 03/05/18   Daleen Bo, MD    Family History Family History  Problem Relation Age of Onset  . Heart attack Father   . Stroke Father   . Arthritis Father   . Heart disease Father   . Cancer Mother        ???  . Arthritis Mother   . Heart disease Brother 19       died in sleep  . Early death Brother   . Diabetes Maternal Uncle   . Alzheimer's disease Maternal Grandmother     Social History Social History   Tobacco Use  . Smoking status: Current Every Day Smoker    Packs/day: 1.00    Years: 46.00    Pack years: 46.00    Types: Cigarettes    Start date: 06/07/1979  . Smokeless tobacco: Former Systems developer    Types: Chew  Substance Use Topics  . Alcohol use: Yes    Alcohol/week: 6.6 - 7.2 oz    Types: 5 - 6 Shots  of liquor, 6 Cans of beer per week    Comment: alcoholic, now drinks intermittently  . Drug use: No    Comment: history of drug use- marijuana, cocaine- quit 2014  Allergies   Carvedilol; Lisinopril; and Amoxicillin   Review of Systems Review of Systems  All other systems reviewed and are negative.    Physical Exam Updated Vital Signs BP (!) 141/88   Pulse 80   Temp 98.1 F (36.7 C) (Oral)   Resp (!) 25   Ht 5\' 6"  (1.676 m)   Wt 96.6 kg (213 lb)   SpO2 95%   BMI 34.38 kg/m   Physical Exam  Constitutional: He is oriented to person, place, and time. He appears well-developed and well-nourished. No distress.  HENT:  Head: Normocephalic and atraumatic.  Right Ear: External ear normal.  Left Ear: External ear normal.  Eyes: Pupils are equal, round, and reactive to light. Conjunctivae and EOM are normal.  Neck: Normal range of motion and phonation normal. Neck supple.  Cardiovascular: Normal rate, regular rhythm and normal heart sounds.  Pulmonary/Chest: Effort normal. No stridor. No respiratory distress. He exhibits no tenderness and no bony tenderness.  Somewhat decreased air movement bilaterally with scattered wheezes and rhonchi.  Abdominal: Soft. There is no tenderness.  Musculoskeletal: Normal range of motion.  Neurological: He is alert and oriented to person, place, and time. No cranial nerve deficit or sensory deficit. He exhibits normal muscle tone. Coordination normal.  Skin: Skin is warm, dry and intact.  Psychiatric: He has a normal mood and affect. His behavior is normal. Judgment and thought content normal.  Nursing note and vitals reviewed.    ED Treatments / Results  Labs (all labs ordered are listed, but only abnormal results are displayed) Labs Reviewed  CBG MONITORING, ED - Abnormal; Notable for the following components:      Result Value   Glucose-Capillary 148 (*)    All other components within normal limits    EKG None  Radiology Dg  Chest 2 View  Result Date: 03/05/2018 CLINICAL DATA:  Productive cough. EXAM: CHEST - 2 VIEW COMPARISON:  Radiographs of October 23, 2017. FINDINGS: Stable cardiomediastinal silhouette is noted. 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 No pneumothorax or pleural effusion is noted. No acute pulmonary disease is noted. Bony thorax is unremarkable. IMPRESSION: No active cardiopulmonary disease. Electronically Signed   By: Marijo Conception, M.D.   On: 03/05/2018 11:51    Procedures Procedures (including critical care time)  Medications Ordered in ED Medications  albuterol (PROVENTIL) (2.5 MG/3ML) 0.083% nebulizer solution 5 mg (5 mg Nebulization Given 03/05/18 1213)  doxycycline (VIBRA-TABS) tablet 100 mg (100 mg Oral Given 03/05/18 1158)  predniSONE (DELTASONE) tablet 60 mg (60 mg Oral Given 03/05/18 1158)     Initial Impression / Assessment and Plan / ED Course  I have reviewed the triage vital signs and the nursing notes.  Pertinent labs & imaging results that were available during my care of the patient were reviewed by me and considered in my medical decision making (see chart for details).  Clinical Course as of Mar 05 1338  Fri Mar 05, 2018  1337 No acute disease, images reviewed  DG Chest 2 View [EW]    Clinical Course User Index [EW] Daleen Bo, MD     Patient Vitals for the past 24 hrs:  BP Temp Temp src Pulse Resp SpO2 Height Weight  03/05/18 1300 (!) 141/88 - - 80 (!) 25 95 % - -  03/05/18 1230 135/86 - - 71 16 98 % - -  03/05/18 1213 - - - - - 96 % - -  03/05/18 1130 139/86 - -  72 18 94 % - -  03/05/18 1100 (!) 143/95 - - 69 (!) 22 95 % - -  03/05/18 1052 (!) 167/95 98.1 F (36.7 C) Oral 76 14 96 % - -  03/05/18 1049 - - - - - - 5\' 6"  (1.676 m) 96.6 kg (213 lb)    1:36 PM Reevaluation with update and discussion. After initial assessment and treatment, an updated evaluation reveals he is comfortable at this time, has had one more episode of mucus  production with blood in it.  Findings discussed with the patient and his family member, all questions answered. Daleen Bo   Medical Decision Making: Valuation is consistent with acute bronchitis, tobacco related.  No evidence for pneumonia, or unstable metabolic condition.  CRITICAL CARE-no Performed by: Daleen Bo   Nursing Notes Reviewed/ Care Coordinated Applicable Imaging Reviewed Interpretation of Laboratory Data incorporated into ED treatment  The patient appears reasonably screened and/or stabilized for discharge and I doubt any other medical condition or other [facility] requiring further screening, evaluation, or treatment in the ED at this time prior to discharge.  Plan: Home Medications-continue usual medications; Home Treatments-stop smoking; return here if the recommended treatment, does not improve the symptoms; Recommended follow up-PCP follow-up as needed   Final Clinical Impressions(s) / ED Diagnoses   Final diagnoses:  Acute bronchitis, unspecified organism  Tobacco abuse    ED Discharge Orders        Ordered    doxycycline (VIBRAMYCIN) 100 MG capsule  2 times daily     03/05/18 1338    predniSONE (DELTASONE) 20 MG tablet  2 times daily     03/05/18 1338       Daleen Bo, MD 03/05/18 1339

## 2018-03-11 ENCOUNTER — Ambulatory Visit (INDEPENDENT_AMBULATORY_CARE_PROVIDER_SITE_OTHER): Payer: Medicare Other | Admitting: *Deleted

## 2018-03-11 ENCOUNTER — Ambulatory Visit (INDEPENDENT_AMBULATORY_CARE_PROVIDER_SITE_OTHER): Payer: Medicare Other | Admitting: Cardiology

## 2018-03-11 ENCOUNTER — Encounter: Payer: Self-pay | Admitting: Cardiology

## 2018-03-11 VITALS — BP 180/110 | HR 98 | Ht 66.0 in | Wt 209.0 lb

## 2018-03-11 DIAGNOSIS — I2510 Atherosclerotic heart disease of native coronary artery without angina pectoris: Secondary | ICD-10-CM

## 2018-03-11 DIAGNOSIS — I10 Essential (primary) hypertension: Secondary | ICD-10-CM

## 2018-03-11 DIAGNOSIS — R55 Syncope and collapse: Secondary | ICD-10-CM

## 2018-03-11 DIAGNOSIS — I25110 Atherosclerotic heart disease of native coronary artery with unstable angina pectoris: Secondary | ICD-10-CM | POA: Diagnosis not present

## 2018-03-11 DIAGNOSIS — E782 Mixed hyperlipidemia: Secondary | ICD-10-CM | POA: Diagnosis not present

## 2018-03-11 NOTE — Patient Instructions (Signed)
Your physician wants you to follow-up in: 4 MONTHS WITH DR BRANCH You will receive a reminder letter in the mail two months in advance. If you don't receive a letter, please call our office to schedule the follow-up appointment.  Your physician recommends that you continue on your current medications as directed. Please refer to the Current Medication list given to you today.  Thank you for choosing [facility]!!   

## 2018-03-11 NOTE — Progress Notes (Signed)
Clinical Summary Randy Hebert is a 52 y.o.male seen today for follow up of the following medical problems.   1. CAD - admit 01/2015 with lateral STEMI, received DES to D1. There was a 70% ramus and 40% mid RCA that was medically managed - 01/2015 echo LVEF 40%, lateral wall hypokinesis - no beta blocker due to history of sinus pause and bradycardia. No ACE-I given history of severe angioedema, no ARB due to risk of cross reactivity - admit 12/2015 with atypical chest pain, worst with palpatoin and movement. Had done some heavy lifting the day it started, negative workup for ACS. Since that time no recurrent symptoms   04/2017 PCI with stent.  - no recent symptoms - compliant with meds    2. SOB/COPD - 07/2015 PFTs: mild to mod ventilatory defect with small airway obstruction.  - 07/2015 CT PE no PE, though suboptimal study - 07/2015 CXR no acute process.  PFTs with mild to mod vent defect, ABGs showed borderline resting hypoxemia.    - recent exacerbation. On doxy, just completed prednisone.   3. Hyperlipidemia - he reports anxiety on crestor, he stopped taking.  - he has had troubles affording alternative statins. Currently on atorva 20mg  daily.  -last lipid panel 10/2016 TC 112 TG 214 HDL 20 LDL 49  - endocrine lowered atorva from 80 to 40mg  due to elevated LFTs. - 01/2018 TC 99 HDL 25 TG 215 LDL 46    4. Palpitations - mild and infrequent   5. Syncope - nocturnal bradycardia noted on previous monitors, no significant daytime episodes - has loop recorder, followed by EP - no beta blockers due to sinus pauses.   - no recent symptoms.    6. HTN - compliant with meds  7. OSA - using CPAP machine.  Past Medical History:  Diagnosis Date  . Allergy   . Anxiety   . Arthritis   . Asthma    hip replacement  . Back pain   . Bulging of cervical intervertebral disc   . CAD (coronary artery disease)    lateral STEMI 02/06/2015 00% D1 occlusion  treated with Promus Premier 2.5 mm x 16 mm DES, 70% ramus stenosis, 40% mid RCA stenosis, 45% distal RCA stenosis, EF 45-50%  . CHF (congestive heart failure) ([place])   . COPD (chronic obstructive pulmonary disease) ([place])   . Depression   . Diabetes mellitus without complication ([place])   . 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 Dry eye   . Dyspnea   . Early satiety 09/23/2016  . GERD (gastroesophageal reflux disease)   . Headache   . Heart murmur   . Hip pain   . Hyperlipidemia   . Hypertension   . MI (myocardial infarction) ([place])   . Myocardial infarction ([place])   . Neck pain   . Otitis media   . Pleurisy   . Sinus pause    9 sec sinus pause on telemetry after started on coreg after MI, avoid AV nodal blocking agent  . Sleep apnea   . 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 Unilateral primary osteoarthritis, right hip 07/01/2016     Allergies  Allergen Reactions  . Carvedilol Other (See Comments)    Sinus pause on telemetry >3 seconds. Longest one 9 sec. No AV nodal agent  . Lisinopril Anaphylaxis, Shortness Of Breath and Swelling    Angioedema, required intubation and mechanical ventilation  .  Amoxicillin Nausea And Vomiting    Nausea only - no allergy     Current Outpatient Medications  Medication Sig Dispense Refill  . acetaminophen (TYLENOL) 325 MG tablet Take 650 mg by mouth every 6 (six) hours as needed for mild pain.    Marland Kitchen albuterol (PROVENTIL) (2.5 MG/3ML) 0.083% nebulizer solution Take 3 mLs (2.5 mg total) by nebulization every 6 (six) hours as needed for wheezing or shortness of breath. 150 mL 1  . aspirin 81 MG EC tablet Take 1 tablet (81 mg total) by mouth daily. 30 tablet   . atorvastatin (LIPITOR) 40 MG tablet Take 1 tablet (40 mg total) by mouth daily. 30 tablet 6  . Carboxymeth-Glycerin-Polysorb (REFRESH OPTIVE ADVANCED) 0.5-1-0.5 % SOLN Apply 1 drop to eye 2 (two) times daily as needed (dry eyes).     . citalopram  (CELEXA) 20 MG tablet Take 20 mg by mouth daily.    . cycloSPORINE (RESTASIS) 0.05 % ophthalmic emulsion Place 1 drop into both eyes 2 (two) times daily.    Marland Kitchen doxycycline (VIBRAMYCIN) 100 MG capsule Take 1 capsule (100 mg total) by mouth 2 (two) times daily. 20 capsule 0  . fluticasone (FLONASE) 50 MCG/ACT nasal spray Place 2 sprays into both nostrils daily. 16 g 6  . furosemide (LASIX) 40 MG tablet TAKE 1 TABLET BY MOUTH ONCE DAILY. 30 tablet 0  . glucose blood (IGLUCOSE TEST STRIPS) test strip 1 each by Other route 2 (two) times daily. Use as instructed 100 each 12  . Magnesium 200 MG TABS Take 1 tablet (200 mg total) by mouth daily. 30 each 6  . metFORMIN (GLUCOPHAGE) 1000 MG tablet Take 1 tablet (1,000 mg total) by mouth 2 (two) times daily with a meal. 180 tablet 3  . mometasone-formoterol (DULERA) 100-5 MCG/ACT AERO Inhale 2 puffs into the lungs 2 (two) times daily. 3 Inhaler 3  . nicotine (NICODERM CQ - DOSED IN MG/24 HOURS) 21 mg/24hr patch Place 1 patch (21 mg total) onto the skin daily. (Patient taking differently: Place 21 mg onto the skin daily. ) 28 patch 0  . nitroGLYCERIN (NITROSTAT) 0.4 MG SL tablet Place 1 tablet (0.4 mg total) under the tongue every 5 (five) minutes as needed for chest pain. 25 tablet 0  . pantoprazole (PROTONIX) 40 MG tablet TAKE 1 TABLET BY MOUTH ONCE A DAY. 30 tablet 5  . potassium chloride SA (K-DUR,KLOR-CON) 20 MEQ tablet TAKE 1 TABLET BY MOUTH DAILY. 30 tablet 0  . prasugrel (EFFIENT) 10 MG TABS tablet TAKE 1 TABLET BY MOUTH ONCE A DAY. 30 tablet 10  . predniSONE (DELTASONE) 20 MG tablet Take 1 tablet (20 mg total) by mouth 2 (two) times daily. 10 tablet 0  . PROVENTIL HFA 108 (90 Base) MCG/ACT inhaler INHALE 2 PUFFS INTO THE LUNGS EVERY 6 HOURS AS NEEDED FOR SHORTNESS OF BREATH OR WHEEZING. 18 g 1  . traZODone (DESYREL) 100 MG tablet Take 100 mg by mouth at bedtime.      No current facility-administered medications for this visit.      Past Surgical  History:  Procedure Laterality Date  . BIOPSY  10/09/2016   Procedure: BIOPSY;  Surgeon: Daneil Dolin, MD;  [facility];  Service: Endoscopy;;  . CARDIAC CATHETERIZATION N/A 02/06/2015   Procedure: Left Heart Cath and Coronary Angiography;  Surgeon: Leonie Man, MD;  Location: [facility];  Service: Cardiovascular;  Laterality: N/A;  . CARDIAC CATHETERIZATION N/A 02/06/2015   Procedure: Coronary Stent  Intervention;  Surgeon: Leonie Man, MD;  Location: [facility];  Service: Cardiovascular;  Laterality: N/A;  . COLONOSCOPY WITH PROPOFOL N/A 10/09/2016   Procedure: COLONOSCOPY WITH PROPOFOL;  Surgeon: Daneil Dolin, MD;  [facility];  Service: Endoscopy;  Laterality: N/A;  12:00 pm  . COLONOSCOPY WITH PROPOFOL N/A 11/24/2016   Procedure: COLONOSCOPY WITH PROPOFOL;  Surgeon: Daneil Dolin, MD;  [facility];  Service: Endoscopy;  Laterality: N/A;  200  . CORONARY ANGIOPLASTY WITH STENT PLACEMENT  01/2015  . CORONARY STENT INTERVENTION N/A 04/27/2017   Procedure: CORONARY STENT INTERVENTION;  Surgeon: Nelva Bush, MD;  Location: [facility];  Service: Cardiovascular;  Laterality: N/A;  . ELECTROPHYSIOLOGY STUDY N/A 06/29/2017   Procedure: ELECTROPHYSIOLOGY STUDY;  Surgeon: Evans Lance, MD;  Location: [facility];  Service: Cardiovascular;  Laterality: N/A;  . ESOPHAGOGASTRODUODENOSCOPY (EGD) WITH PROPOFOL N/A 10/09/2016   Procedure: ESOPHAGOGASTRODUODENOSCOPY (EGD) WITH PROPOFOL;  Surgeon: Daneil Dolin, MD;  [facility];  Service: Endoscopy;  Laterality: N/A;  . INCISION / DRAINAGE HAND / FINGER    . LEFT HEART CATH AND CORONARY ANGIOGRAPHY N/A 04/27/2017   Procedure: LEFT HEART CATH AND CORONARY ANGIOGRAPHY;  Surgeon: Nelva Bush, MD;  Location: [facility];  Service: Cardiovascular;  Laterality: N/A;  . LOOP RECORDER INSERTION  06/29/2017   Procedure: Loop Recorder Insertion;  Surgeon: Evans Lance, MD;  Location: [facility];  Service: Cardiovascular;;  . POLYPECTOMY  11/24/2016   Procedure: POLYPECTOMY;  Surgeon: Daneil Dolin, MD;  [facility];  Service: Endoscopy;;  descending and sigmoid  . TOTAL HIP ARTHROPLASTY Right 07/01/2016  . TOTAL HIP ARTHROPLASTY Right 07/01/2016   Procedure: RIGHT TOTAL HIP ARTHROPLASTY ANTERIOR APPROACH;  Surgeon: Mcarthur Rossetti, MD;  Location: [facility];  Service: Orthopedics;  Laterality: Right;     Allergies  Allergen Reactions  . Carvedilol Other (See Comments)    Sinus pause on telemetry >3 seconds. Longest one 9 sec. No AV nodal agent  . Lisinopril Anaphylaxis, Shortness Of Breath and Swelling    Angioedema, required intubation and mechanical ventilation  . Amoxicillin Nausea And Vomiting    Nausea only - no allergy      Family History  Problem Relation Age of Onset  . Heart attack Father   . Stroke Father   . Arthritis Father   . Heart disease Father   . Cancer Mother        ???  . Arthritis Mother   . Heart disease Brother 35       died in sleep  . Early death Brother   . Diabetes Maternal Uncle   . Alzheimer's disease Maternal Grandmother      Social History Randy Hebert reports that he has been smoking cigarettes.  He started smoking about 38 years ago. He has a 46.00 pack-year smoking history. He has quit using smokeless tobacco. His smokeless tobacco use included chew. Randy Hebert reports that he drinks about 6.6 - 7.2 oz of alcohol per week.   Review of Systems CONSTITUTIONAL: No weight loss, fever, chills, weakness or fatigue.  HEENT: Eyes: No visual loss, blurred vision, double vision or yellow sclerae.No hearing loss, sneezing, congestion, runny nose or sore throat.  SKIN: No rash or itching.  CARDIOVASCULAR: per hpi RESPIRATORY: No shortness of breath, cough or sputum.  GASTROINTESTINAL: No anorexia, nausea, vomiting or diarrhea. No abdominal pain or blood.  GENITOURINARY:  No burning on  urination, no polyuria NEUROLOGICAL: No headache, dizziness, syncope, paralysis, ataxia, numbness or tingling in the extremities. No change in bowel or bladder control.  MUSCULOSKELETAL: No muscle, back pain, joint pain or stiffness.  LYMPHATICS: No enlarged nodes. No history of splenectomy.  PSYCHIATRIC: No history of depression or anxiety.  ENDOCRINOLOGIC: No reports of sweating, cold or heat intolerance. No polyuria or polydipsia.  Marland Kitchen   Physical Examination Vitals:   03/11/18 1250  BP: (!) 180/110  Pulse: 98  SpO2: 95%   Vitals:   03/11/18 1250  Weight: 209 lb (94.8 kg)  Height: 5\' 6"  (1.676 m)    Gen: resting comfortably, no acute distress HEENT: no scleral icterus, pupils equal round and reactive, no palptable cervical adenopathy,  CV: RRR, no m/r/g, no jvd Resp: Clear to auscultation bilaterally GI: abdomen is soft, non-tender, non-distended, normal bowel sounds, no hepatosplenomegaly MSK: extremities are warm, no edema.  Skin: warm, no rash Neuro:  no focal deficits Psych: appropriate affect   Diagnostic Studies  01/2015 cath 1. 1st Diag lesion, 100% stenosed. A Promus Premier 2.5 mm x 16 mm drug-eluting stent was placed. There is a 0% residual stenosis post intervention. 2. Ramus lesion, 70% stenosed. 3. Mid RCA lesion, 40% stenosed. Dist RCA lesion, 45% stenosed. 4. Mild to moderately reduced LVEF with anterolateral and apical hypokinesis and elevated LVEDP  Abnormal anatomy with equal sized Diagonal and LAD Breannah Kratt but extensive RCA. Successful PCI of the First Diagonal Anyssa Sharpless.  Recommendations:  Standard post radial cath/PCI TR band removal  Dual independent therapy for minimum one year.  Check 2-D echocardiogram to better assess EF  Add statin and beta blocker. Smoking cessation counseling. Cardiac Rehabilitation And Case Management Consultation  If hemodynamic stable, would consider fast-track discharge  Would consider noninvasive evaluation of the  [place] [employer] lesion in the absence of any recurrent anginal pain.  01/2015 echo Study Conclusions  - Left ventricle: Moderately severe hypokinesis of mid anterolateral segment, apical lateral segment, mid/apical anterior segments. EF is 40%. The cavity size was normal. Wall thickness was increased in a pattern of mild LVH. - Aortic valve: Sclerosis without stenosis. There was no significant regurgitation. - Right ventricle: The cavity size was normal. Systolic function was normal.  07/2015 CT PE IMPRESSION: 1. Suboptimal contrast bolus timing in the pulmonary arterial tree, poor in the lobar vessels other than the right lower lobe. No central pulmonary embolus. If there is continued suspicion of acute PE nuclear medicine V/Q scan may be most helpful. 2. Situs abnormality in the chest with right side aortic arch and descending aorta. Mirror image branching. Other visible situs including cardiac and upper abdominal situs appear normal. 3. Calcified Coronary artery atherosclerosis. Hepatomegaly with fatty liver disease.  07/2015 PFTs: mild to mod ventilatory defect with small airway obstruction, moderately reduced DLCO.       Assessment and Plan   1. CAD - doing well without symptoms, continue current meds  2. COPD - per pcp  3. Hyperlipideima -continue crrent statin.    4. HTN -manual recheck at goal 13/80, continue current meds        Arnoldo Lenis, M.D.

## 2018-03-12 NOTE — Progress Notes (Signed)
[facility] Summary Report / Loop Recorder 

## 2018-03-16 LAB — CUP PACEART REMOTE DEVICE CHECK
Implantable Pulse Generator Implant Date: 20181029
MDC IDC SESS DTM: 20190608224103

## 2018-03-19 ENCOUNTER — Other Ambulatory Visit: Payer: Self-pay | Admitting: *Deleted

## 2018-03-19 NOTE — Telephone Encounter (Signed)
Called and notified pt that [facility] paperwork is ready. Patient voiced understanding.

## 2018-03-25 ENCOUNTER — Encounter: Payer: Self-pay | Admitting: Cardiology

## 2018-03-29 ENCOUNTER — Telehealth: Payer: Self-pay | Admitting: Cardiology

## 2018-03-29 DIAGNOSIS — C73 Malignant neoplasm of thyroid gland: Secondary | ICD-10-CM | POA: Diagnosis not present

## 2018-03-29 DIAGNOSIS — E042 Nontoxic multinodular goiter: Secondary | ICD-10-CM | POA: Diagnosis not present

## 2018-03-29 DIAGNOSIS — E039 Hypothyroidism, unspecified: Secondary | ICD-10-CM | POA: Diagnosis not present

## 2018-03-29 DIAGNOSIS — Z1389 Encounter for screening for other disorder: Secondary | ICD-10-CM | POA: Diagnosis not present

## 2018-03-29 DIAGNOSIS — I2510 Atherosclerotic heart disease of native coronary artery without angina pectoris: Secondary | ICD-10-CM | POA: Diagnosis not present

## 2018-03-29 DIAGNOSIS — Z1331 Encounter for screening for depression: Secondary | ICD-10-CM | POA: Diagnosis not present

## 2018-03-29 DIAGNOSIS — L84 Corns and callosities: Secondary | ICD-10-CM | POA: Diagnosis not present

## 2018-03-29 DIAGNOSIS — N19 Unspecified kidney failure: Secondary | ICD-10-CM | POA: Diagnosis not present

## 2018-03-29 DIAGNOSIS — E1165 Type 2 diabetes mellitus with hyperglycemia: Secondary | ICD-10-CM | POA: Diagnosis not present

## 2018-03-29 DIAGNOSIS — E559 Vitamin D deficiency, unspecified: Secondary | ICD-10-CM | POA: Diagnosis not present

## 2018-03-29 DIAGNOSIS — G4733 Obstructive sleep apnea (adult) (pediatric): Secondary | ICD-10-CM | POA: Diagnosis not present

## 2018-03-29 DIAGNOSIS — F17210 Nicotine dependence, cigarettes, uncomplicated: Secondary | ICD-10-CM | POA: Diagnosis not present

## 2018-03-29 DIAGNOSIS — J449 Chronic obstructive pulmonary disease, unspecified: Secondary | ICD-10-CM | POA: Diagnosis not present

## 2018-03-29 DIAGNOSIS — Z0000 Encounter for general adult medical examination without abnormal findings: Secondary | ICD-10-CM | POA: Diagnosis not present

## 2018-03-29 DIAGNOSIS — E119 Type 2 diabetes mellitus without complications: Secondary | ICD-10-CM | POA: Diagnosis not present

## 2018-03-29 DIAGNOSIS — I10 Essential (primary) hypertension: Secondary | ICD-10-CM | POA: Diagnosis not present

## 2018-03-29 NOTE — Telephone Encounter (Signed)
Patient informed. 

## 2018-03-29 NOTE — Telephone Encounter (Signed)
Patient's PCP (Randy Hebert) started patient on Amlodopine. He is wanting to make sure this is okay with Dr.Branch. / tg

## 2018-03-29 NOTE — Telephone Encounter (Signed)
Ok with startign that medication   [name] MD

## 2018-04-13 ENCOUNTER — Ambulatory Visit (INDEPENDENT_AMBULATORY_CARE_PROVIDER_SITE_OTHER): Payer: Medicare Other | Admitting: *Deleted

## 2018-04-13 DIAGNOSIS — R55 Syncope and collapse: Secondary | ICD-10-CM | POA: Diagnosis not present

## 2018-04-14 NOTE — Progress Notes (Signed)
[facility] Summary Report / Loop Recorder 

## 2018-04-21 LAB — CUP PACEART REMOTE DEVICE CHECK
MDC IDC PG IMPLANT DT: 20181029
MDC IDC SESS DTM: 20190711223904

## 2018-04-30 ENCOUNTER — Other Ambulatory Visit: Payer: Self-pay | Admitting: Internal Medicine

## 2018-04-30 ENCOUNTER — Other Ambulatory Visit: Payer: Self-pay | Admitting: Gastroenterology

## 2018-05-17 ENCOUNTER — Ambulatory Visit (INDEPENDENT_AMBULATORY_CARE_PROVIDER_SITE_OTHER): Payer: Medicare Other | Admitting: *Deleted

## 2018-05-17 DIAGNOSIS — R55 Syncope and collapse: Secondary | ICD-10-CM | POA: Diagnosis not present

## 2018-05-17 NOTE — Progress Notes (Signed)
[facility] Summary Report / Loop Recorder 

## 2018-05-20 LAB — CUP PACEART REMOTE DEVICE CHECK
Date Time Interrogation Session: 20190813234111
MDC IDC PG IMPLANT DT: 20181029

## 2018-05-24 DIAGNOSIS — I10 Essential (primary) hypertension: Secondary | ICD-10-CM | POA: Diagnosis not present

## 2018-05-24 DIAGNOSIS — E119 Type 2 diabetes mellitus without complications: Secondary | ICD-10-CM | POA: Diagnosis not present

## 2018-05-24 DIAGNOSIS — Z23 Encounter for immunization: Secondary | ICD-10-CM | POA: Diagnosis not present

## 2018-05-24 DIAGNOSIS — I2510 Atherosclerotic heart disease of native coronary artery without angina pectoris: Secondary | ICD-10-CM | POA: Diagnosis not present

## 2018-05-24 DIAGNOSIS — G4733 Obstructive sleep apnea (adult) (pediatric): Secondary | ICD-10-CM | POA: Diagnosis not present

## 2018-05-30 LAB — CUP PACEART REMOTE DEVICE CHECK
Date Time Interrogation Session: 20190916000552
Implantable Pulse Generator Implant Date: 20181029

## 2018-06-17 DIAGNOSIS — I10 Essential (primary) hypertension: Secondary | ICD-10-CM | POA: Diagnosis not present

## 2018-06-17 DIAGNOSIS — G4733 Obstructive sleep apnea (adult) (pediatric): Secondary | ICD-10-CM | POA: Diagnosis not present

## 2018-06-17 DIAGNOSIS — R569 Unspecified convulsions: Secondary | ICD-10-CM | POA: Diagnosis not present

## 2018-06-17 DIAGNOSIS — G5601 Carpal tunnel syndrome, right upper limb: Secondary | ICD-10-CM | POA: Diagnosis not present

## 2018-06-18 ENCOUNTER — Ambulatory Visit (INDEPENDENT_AMBULATORY_CARE_PROVIDER_SITE_OTHER): Payer: Medicare Other | Admitting: *Deleted

## 2018-06-18 DIAGNOSIS — R55 Syncope and collapse: Secondary | ICD-10-CM | POA: Diagnosis not present

## 2018-06-21 DIAGNOSIS — I10 Essential (primary) hypertension: Secondary | ICD-10-CM | POA: Diagnosis not present

## 2018-06-21 DIAGNOSIS — I2510 Atherosclerotic heart disease of native coronary artery without angina pectoris: Secondary | ICD-10-CM | POA: Diagnosis not present

## 2018-06-21 DIAGNOSIS — E1010 Type 1 diabetes mellitus with ketoacidosis without coma: Secondary | ICD-10-CM | POA: Diagnosis not present

## 2018-06-21 DIAGNOSIS — E119 Type 2 diabetes mellitus without complications: Secondary | ICD-10-CM | POA: Diagnosis not present

## 2018-06-21 DIAGNOSIS — J302 Other seasonal allergic rhinitis: Secondary | ICD-10-CM | POA: Diagnosis not present

## 2018-06-21 NOTE — Progress Notes (Signed)
[facility] Summary Report / Loop Recorder 

## 2018-06-23 DIAGNOSIS — E1159 Type 2 diabetes mellitus with other circulatory complications: Secondary | ICD-10-CM | POA: Diagnosis not present

## 2018-06-24 LAB — COMPLETE METABOLIC PANEL WITH GFR
AG RATIO: 1.6 (calc) (ref 1.0–2.5)
ALBUMIN MSPROF: 4.1 g/dL (ref 3.6–5.1)
ALKALINE PHOSPHATASE (APISO): 83 U/L (ref 40–115)
ALT: 82 U/L — ABNORMAL HIGH (ref 9–46)
AST: 86 U/L — ABNORMAL HIGH (ref 10–35)
BILIRUBIN TOTAL: 0.7 mg/dL (ref 0.2–1.2)
BUN: 8 mg/dL (ref 7–25)
CHLORIDE: 105 mmol/L (ref 98–110)
CO2: 29 mmol/L (ref 20–32)
Calcium: 9.7 mg/dL (ref 8.6–10.3)
Creat: 0.74 mg/dL (ref 0.70–1.33)
GFR, Est African American: 122 mL/min/{1.73_m2} (ref >=60)
GFR, Est Non African American: 105 mL/min/{1.73_m2} (ref >=60)
Globulin: 2.6 g/dL (calc) (ref 1.9–3.7)
Glucose, Bld: 119 mg/dL — ABNORMAL HIGH (ref 65–99)
Potassium: 4.8 mmol/L (ref 3.5–5.3)
SODIUM: 141 mmol/L (ref 135–146)
Total Protein: 6.7 g/dL (ref 6.1–8.1)

## 2018-06-24 LAB — HEMOGLOBIN A1C
HEMOGLOBIN A1C: 6.7 %{Hb} — AB (ref <5.7)
Mean Plasma Glucose: 146 (calc)
eAG (mmol/L): 8.1 (calc)

## 2018-06-30 ENCOUNTER — Encounter: Payer: Self-pay | Admitting: "Endocrinology

## 2018-06-30 ENCOUNTER — Ambulatory Visit (INDEPENDENT_AMBULATORY_CARE_PROVIDER_SITE_OTHER): Payer: Medicare Other | Admitting: "Endocrinology

## 2018-06-30 VITALS — BP 152/89 | HR 82 | Ht 67.0 in | Wt 215.0 lb

## 2018-06-30 DIAGNOSIS — E559 Vitamin D deficiency, unspecified: Secondary | ICD-10-CM

## 2018-06-30 DIAGNOSIS — E782 Mixed hyperlipidemia: Secondary | ICD-10-CM

## 2018-06-30 DIAGNOSIS — F17200 Nicotine dependence, unspecified, uncomplicated: Secondary | ICD-10-CM

## 2018-06-30 DIAGNOSIS — Z6836 Body mass index (BMI) 36.0-36.9, adult: Secondary | ICD-10-CM

## 2018-06-30 DIAGNOSIS — E1159 Type 2 diabetes mellitus with other circulatory complications: Secondary | ICD-10-CM

## 2018-06-30 DIAGNOSIS — I25110 Atherosclerotic heart disease of native coronary artery with unstable angina pectoris: Secondary | ICD-10-CM | POA: Diagnosis not present

## 2018-06-30 DIAGNOSIS — F1010 Alcohol abuse, uncomplicated: Secondary | ICD-10-CM

## 2018-06-30 DIAGNOSIS — R635 Abnormal weight gain: Secondary | ICD-10-CM | POA: Insufficient documentation

## 2018-06-30 MED ORDER — AMLODIPINE BESYLATE 10 MG PO TABS: 10.0000 mg | ORAL_TABLET | Freq: Every day | ORAL | 6 refills | Status: DC

## 2018-06-30 NOTE — Progress Notes (Signed)
Endocrinology follow-up note       06/30/2018, 10:13 AM   Subjective:    Patient ID: Randy Hebert, male    DOB: Feb 11, 1966.  Randy Hebert is being seen in follow-up for management of currently uncontrolled symptomatic type 2 diabetes, hyperlipidemia, hypertension. PMD:  Randy Fire, MD.   Past Medical History:  Diagnosis Date  . Allergy   . Anxiety   . Arthritis   . Asthma    hip replacement  . Back pain   . Bulging of cervical intervertebral disc   . CAD (coronary artery disease)    lateral STEMI 02/06/2015 00% D1 occlusion treated with Promus Premier 2.5 mm x 16 mm DES, 70% ramus stenosis, 40% mid RCA stenosis, 45% distal RCA stenosis, EF 45-50%  . CHF (congestive heart failure) ([place])   . COPD (chronic obstructive pulmonary disease) ([place])   . Depression   . Diabetes mellitus without complication ([place])   . 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 Dry eye   . Dyspnea   . Early satiety 09/23/2016  . GERD (gastroesophageal reflux disease)   . Headache   . Heart murmur   . Hip pain   . Hyperlipidemia   . Hypertension   . MI (myocardial infarction) ([place])   . Myocardial infarction ([place])   . Neck pain   . Otitis media   . Pleurisy   . Sinus pause    9 sec sinus pause on telemetry after started on coreg after MI, avoid AV nodal blocking agent  . Sleep apnea   . 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 Unilateral primary osteoarthritis, right hip 07/01/2016   Past Surgical History:  Procedure Laterality Date  . BIOPSY  10/09/2016   Procedure: BIOPSY;  Surgeon: Randy Dolin, MD;  [facility];  Service: Endoscopy;;  . CARDIAC CATHETERIZATION N/A 02/06/2015   Procedure: Left Heart Cath and Coronary Angiography;  Surgeon: Randy Man, MD;  Location: [facility];  Service: Cardiovascular;  Laterality: N/A;  . CARDIAC CATHETERIZATION  N/A 02/06/2015   Procedure: Coronary Stent Intervention;  Surgeon: Randy Man, MD;  Location: [facility];  Service: Cardiovascular;  Laterality: N/A;  . COLONOSCOPY WITH PROPOFOL N/A 10/09/2016   Procedure: COLONOSCOPY WITH PROPOFOL;  Surgeon: Randy Dolin, MD;  [facility];  Service: Endoscopy;  Laterality: N/A;  12:00 pm  . COLONOSCOPY WITH PROPOFOL N/A 11/24/2016   Procedure: COLONOSCOPY WITH PROPOFOL;  Surgeon: Randy Dolin, MD;  [facility];  Service: Endoscopy;  Laterality: N/A;  200  . CORONARY ANGIOPLASTY WITH STENT PLACEMENT  01/2015  . CORONARY STENT INTERVENTION N/A 04/27/2017   Procedure: CORONARY STENT INTERVENTION;  Surgeon: Randy Bush, MD;  Location: [facility];  Service: Cardiovascular;  Laterality: N/A;  . ELECTROPHYSIOLOGY STUDY N/A 06/29/2017   Procedure: ELECTROPHYSIOLOGY STUDY;  Surgeon: Randy Lance, MD;  Location: [facility];  Service: Cardiovascular;  Laterality: N/A;  . ESOPHAGOGASTRODUODENOSCOPY (EGD) WITH PROPOFOL N/A 10/09/2016   Procedure: ESOPHAGOGASTRODUODENOSCOPY (EGD) WITH PROPOFOL;  Surgeon:  Randy Dolin, MD;  [facility];  Service: Endoscopy;  Laterality: N/A;  . INCISION / DRAINAGE HAND / FINGER    . LEFT HEART CATH AND CORONARY ANGIOGRAPHY N/A 04/27/2017   Procedure: LEFT HEART CATH AND CORONARY ANGIOGRAPHY;  Surgeon: Randy Bush, MD;  Location: [facility];  Service: Cardiovascular;  Laterality: N/A;  . LOOP RECORDER INSERTION  06/29/2017   Procedure: Loop Recorder Insertion;  Surgeon: Randy Lance, MD;  Location: [facility];  Service: Cardiovascular;;  . POLYPECTOMY  11/24/2016   Procedure: POLYPECTOMY;  Surgeon: Randy Dolin, MD;  [facility];  Service: Endoscopy;;  descending and sigmoid  . TOTAL HIP ARTHROPLASTY Right 07/01/2016  . TOTAL HIP ARTHROPLASTY Right 07/01/2016   Procedure: RIGHT TOTAL HIP ARTHROPLASTY ANTERIOR APPROACH;  Surgeon:  Mcarthur Rossetti, MD;  Location: [facility];  Service: Orthopedics;  Laterality: Right;   Social History   Socioeconomic History  . Marital status: Significant Other    Spouse name: Randy Hebert  . Number of children: 3  . Years of education: 72  . Highest education level: Not on file  Occupational History  . Occupation: unemployed    Comment: Nature conservation officer    Comment: disability pending  Social Needs  . Financial resource strain: Not on file  . Food insecurity:    Worry: Not on file    Inability: Not on file  . Transportation needs:    Medical: Not on file    Non-medical: Not on file  Tobacco Use  . Smoking status: Current Every Day Smoker    Packs/day: 1.00    Years: 46.00    Pack years: 46.00    Types: Cigarettes    Start date: 06/07/1979  . Smokeless tobacco: Former Systems developer    Types: Chew  Substance and Sexual Activity  . Alcohol use: Yes    Alcohol/week: 11.0 - 12.0 standard drinks    Types: 5 - 6 Shots of liquor, 6 Cans of beer per week    Comment: alcoholic, now drinks intermittently  . Drug use: No    Comment: history of drug use- marijuana, cocaine- quit 2014  . Sexual activity: Yes    Partners: Female    Birth control/protection: None  Lifestyle  . Physical activity:    Days per week: Not on file    Minutes per session: Not on file  . Stress: Not on file  Relationships  . Social connections:    Talks on phone: Not on file    Gets together: Not on file    Attends religious service: Not on file    Active member of club or organization: Not on file    Attends meetings of clubs or organizations: Not on file    Relationship status: Not on file  Other Topics Concern  . Not on file  Social History Narrative   Lives with girlfriend Randy Hebert   Leisure - TV   Outpatient Encounter Medications as of 06/30/2018  Medication Sig  . acetaminophen (TYLENOL) 325 MG tablet Take 650 mg by mouth every 6 (six) hours as needed for mild pain.  Marland Kitchen albuterol (PROVENTIL) (2.5  MG/3ML) 0.083% nebulizer solution Take 3 mLs (2.5 mg total) by nebulization every 6 (six) hours as needed for wheezing or shortness of breath.  Marland Kitchen amLODipine (NORVASC) 10 MG tablet Take 1 tablet (10 mg total) by mouth daily.  Marland Kitchen aspirin 81 MG EC tablet Take 1 tablet (81 mg total) by mouth daily.  Marland Kitchen  atorvastatin (LIPITOR) 40 MG tablet Take 1 tablet (40 mg total) by mouth daily.  . Carboxymeth-Glycerin-Polysorb (REFRESH OPTIVE ADVANCED) 0.5-1-0.5 % SOLN Apply 1 drop to eye 2 (two) times daily as needed (dry eyes).   . citalopram (CELEXA) 20 MG tablet Take 20 mg by mouth daily.  . cycloSPORINE (RESTASIS) 0.05 % ophthalmic emulsion Place 1 drop into both eyes 2 (two) times daily.  . fluticasone (FLONASE) 50 MCG/ACT nasal spray Place 2 sprays into both nostrils daily.  . furosemide (LASIX) 40 MG tablet TAKE 1 TABLET BY MOUTH ONCE DAILY.  Marland Kitchen glucose blood (IGLUCOSE TEST STRIPS) test strip 1 each by Other route 2 (two) times daily. Use as instructed  . Magnesium 200 MG TABS Take 1 tablet (200 mg total) by mouth daily.  . metFORMIN (GLUCOPHAGE) 1000 MG tablet Take 1 tablet (1,000 mg total) by mouth 2 (two) times daily with a meal.  . mometasone-formoterol (DULERA) 100-5 MCG/ACT AERO Inhale 2 puffs into the lungs 2 (two) times daily.  . nicotine (NICODERM CQ - DOSED IN MG/24 HOURS) 21 mg/24hr patch Place 1 patch (21 mg total) onto the skin daily. (Patient taking differently: Place 21 mg onto the skin daily. )  . nitroGLYCERIN (NITROSTAT) 0.4 MG SL tablet Place 1 tablet (0.4 mg total) under the tongue every 5 (five) minutes as needed for chest pain.  . pantoprazole (PROTONIX) 40 MG tablet TAKE 1 TABLET BY MOUTH ONCE A DAY.  Marland Kitchen potassium chloride SA (K-DUR,KLOR-CON) 20 MEQ tablet TAKE 1 TABLET BY MOUTH DAILY.  . prasugrel (EFFIENT) 10 MG TABS tablet TAKE 1 TABLET BY MOUTH ONCE A DAY.  Marland Kitchen PROVENTIL HFA 108 (90 Base) MCG/ACT inhaler INHALE 2 PUFFS INTO THE LUNGS EVERY 6 HOURS AS NEEDED FOR SHORTNESS OF BREATH OR  WHEEZING.  . traZODone (DESYREL) 100 MG tablet Take 100 mg by mouth at bedtime.   . [DISCONTINUED] amLODipine (NORVASC) 5 MG tablet Take 5 mg by mouth daily.  . [DISCONTINUED] doxycycline (VIBRAMYCIN) 100 MG capsule Take 1 capsule (100 mg total) by mouth 2 (two) times daily.   No facility-administered encounter medications on file as of 06/30/2018.     ALLERGIES: Allergies  Allergen Reactions  . Carvedilol Other (See Comments)    Sinus pause on telemetry >3 seconds. Longest one 9 sec. No AV nodal agent  . Lisinopril Anaphylaxis, Shortness Of Breath and Swelling    Angioedema, required intubation and mechanical ventilation  . Amoxicillin Nausea And Vomiting    Nausea only - no allergy    VACCINATION STATUS: Immunization History  Administered Date(s) Administered  . Influenza,inj,Quad PF,6+ Mos 07/03/2016, 05/07/2017  . Pneumococcal Conjugate-13 06/17/2017  . Pneumococcal Polysaccharide-23 02/07/2015    Diabetes  He presents for his follow-up diabetic visit. He has type 2 diabetes mellitus. Onset time: He was diagnosed at approximate age of 61 years. His disease course has been stable. There are no hypoglycemic associated symptoms. Pertinent negatives for hypoglycemia include no confusion, headaches, pallor or seizures. Pertinent negatives for diabetes include no chest pain, no fatigue, no polydipsia, no polyphagia, no polyuria and no weakness. There are no hypoglycemic complications. Symptoms are stable. Diabetic complications include heart disease. Risk factors for coronary artery disease include dyslipidemia, diabetes mellitus, hypertension, male sex, family history, obesity, sedentary lifestyle and tobacco exposure. Current diabetic treatment includes oral agent (monotherapy). He is compliant with treatment most of the time. His weight is increasing steadily. He is following a generally unhealthy diet. When asked about meal planning, he reported none. He has  had a previous visit with a  dietitian. He never participates in exercise. An ACE inhibitor/angiotensin II receptor blocker is contraindicated (He has documented allergy for ACE inhibitor's). He does not see a podiatrist.Eye exam is not current.  Hyperlipidemia  This is a chronic problem. The current episode started more than 1 year ago. Exacerbating diseases include diabetes and obesity. Pertinent negatives include no chest pain, myalgias or shortness of breath. Current antihyperlipidemic treatment includes statins. Risk factors for coronary artery disease include dyslipidemia, diabetes mellitus, family history, obesity, male sex, hypertension and a sedentary lifestyle.  Hypertension  This is a chronic problem. The current episode started more than 1 year ago. Pertinent negatives include no chest pain, headaches, neck pain, palpitations or shortness of breath. Risk factors for coronary artery disease include diabetes mellitus, dyslipidemia, obesity, sedentary lifestyle and smoking/tobacco exposure. Past treatments include calcium channel blockers. Hypertensive end-organ damage includes CAD/MI.    Review of Systems  Constitutional: Negative for chills, fatigue, fever and unexpected weight change.  HENT: Negative for dental problem, mouth sores and trouble swallowing.   Eyes: Negative for visual disturbance.  Respiratory: Negative for cough, choking, chest tightness, shortness of breath and wheezing.   Cardiovascular: Negative for chest pain, palpitations and leg swelling.  Gastrointestinal: Negative for abdominal distention, abdominal pain, constipation, diarrhea, nausea and vomiting.  Endocrine: Negative for polydipsia, polyphagia and polyuria.  Genitourinary: Negative for dysuria, flank pain, hematuria and urgency.  Musculoskeletal: Negative for back pain, gait problem, myalgias and neck pain.  Skin: Negative for pallor, rash and wound.  Neurological: Negative for seizures, syncope, weakness, numbness and headaches.   Psychiatric/Behavioral: Negative.  Negative for confusion and dysphoric mood.    Objective:    BP (!) 152/89   Pulse 82   Ht 5\' 7"  (1.702 m)   Wt 215 lb (97.5 kg)   BMI 33.67 kg/m   Wt Readings from Last 3 Encounters:  06/30/18 215 lb (97.5 kg)  03/11/18 209 lb (94.8 kg)  03/05/18 213 lb (96.6 kg)     Physical Exam  Constitutional: He is oriented to person, place, and time. He appears well-developed. He is cooperative. No distress.  HENT:  Head: Normocephalic and atraumatic.  Eyes: EOM are normal.  Neck: Normal range of motion. Neck supple. No tracheal deviation present. No thyromegaly present.  Cardiovascular: Normal rate, S1 normal and S2 normal. Exam reveals no gallop.  No murmur heard. Pulses:      Dorsalis pedis pulses are 1+ on the right side, and 1+ on the left side.       Posterior tibial pulses are 1+ on the right side, and 1+ on the left side.  Pulmonary/Chest: Effort normal. No respiratory distress. He has no wheezes.  Abdominal: He exhibits no distension. There is no tenderness. There is no guarding and no CVA tenderness.  Musculoskeletal: He exhibits no edema.       Right shoulder: He exhibits no swelling and no deformity.  Neurological: He is alert and oriented to person, place, and time. He has normal strength. No cranial nerve deficit or sensory deficit. Gait normal.  Skin: Skin is warm and dry. No rash noted. No cyanosis. Nails show no clubbing.  Psychiatric: He has a normal mood and affect. His speech is normal. Cognition and memory are normal.    CMP     Component Value Date/Time   NA 141 06/23/2018 0734   K 4.8 06/23/2018 0734   CL 105 06/23/2018 0734   CO2 29 06/23/2018 0734  GLUCOSE 119 (H) 06/23/2018 0734   BUN 8 06/23/2018 0734   CREATININE 0.74 06/23/2018 0734   CALCIUM 9.7 06/23/2018 0734   PROT 6.7 06/23/2018 0734   ALBUMIN 3.7 04/15/2017 0927   AST 86 (H) 06/23/2018 0734   ALT 82 (H) 06/23/2018 0734   ALKPHOS 98 04/15/2017 0927    BILITOT 0.7 06/23/2018 0734   GFRNONAA 105 06/23/2018 0734   GFRAA 122 06/23/2018 0734    Diabetic Labs (most recent): Lab Results  Component Value Date   HGBA1C 6.7 (H) 06/23/2018   HGBA1C 6.4 (H) 02/18/2018   HGBA1C 6.5 (H) 11/17/2017     Lipid Panel ( most recent) Lipid Panel     Component Value Date/Time   CHOL 99 02/18/2018 0717   TRIG 215 (H) 02/18/2018 0717   HDL 25 (L) 02/18/2018 0717   CHOLHDL 4.0 02/18/2018 0717   VLDL 41 (H) 04/27/2017 0505   LDLCALC 46 02/18/2018 0717      Lab Results  Component Value Date   TSH 1.79 11/17/2017   TSH 1.635 04/26/2017   TSH 2.564 06/27/2015   FREET4 1.4 11/17/2017      Assessment & Plan:   1. DM type 2 causing vascular disease ([place])  - Randy Hebert has currently uncontrolled symptomatic type 2 DM since  52 years of age. -He returns with stable A1c of 6.7%, generally improving from 7.6%.   -his diabetes is complicated by  coronary artery disease which required stent placement, obesity/sedentary life, chronic heavy smoking, and KEAVON SENSING remains at a high risk for more acute and chronic complications which include CAD, CVA, CKD, retinopathy, and neuropathy. These are all discussed in detail with the patient.  - I have counseled him on diet management and weight loss, by adopting a carbohydrate restricted/protein rich diet. -Still admits to dietary indiscretions including consumption of sweets and sweetened beverages.  -  Suggestion is made for him to avoid simple carbohydrates  from his diet including Cakes, Sweet Desserts / Pastries, Ice Cream, Soda (diet and regular), Sweet Tea, Candies, Chips, Cookies, Store Bought Juices, Alcohol in Excess of  1-2 drinks a day, Artificial Sweeteners, and "Sugar-free" Products. This will help patient to have stable blood glucose profile and potentially avoid unintended weight gain.   - I encouraged him to switch to  unprocessed or minimally processed complex starch and increased  protein intake (animal or plant source), fruits, and vegetables.  - he is advised to stick to a routine mealtimes to eat 3 meals  a day and avoid unnecessary snacks ( to snack only to correct hypoglycemia).    - I have approached him with the following individualized plan to manage diabetes and patient agrees:    -He has maintained control of diabetes without insulin.  -Emphasizing the need for him to stay engaged, given his history of coronary artery disease, he is advised to continue metformin 1000 mg p.o. twice daily,  therapeutically suitable for the patient.    - Patient specific target  A1c;  LDL, HDL, Triglycerides, and  Waist Circumference were discussed in detail.  2) BP/HTN: His blood pressure is not controlled to target.  He has a documented allergy of ACE inhibitors anaphylaxis .  He is on a low-dose of amlodipine.  I advised him to increase amlodipine to 10 mg p.o. daily along with his PRN diuretics.     3) Lipids/HPL: Recent lipid panel showed controlled LDL at 46, triglycerides still high at 215.  He is advised  to avoid butter and fried food.  Due to elevated liver enzymes (which are mainly due to his alcohol abuse) I advised him to lower his atorvastatin to 40 mg from 80 mg.  Patient continues to use alcohol heavily, could explain the transaminitis.  He is advised to consider weaning of alcohol off.  4)  Weight/Diet: CDE Consult has been initiated and in progress,  exercise, and detailed carbohydrates information provided.  5) Chronic Care/Health Maintenance:  -he  is on Statin medications and  is encouraged to continue to follow up with Ophthalmology, Dentist,  Podiatrist at least yearly or according to recommendations, and advised to  Quit smoking ( is a chronic heavy smoker for the last 40 days), and consider weaning off of alcohol.  I have recommended yearly flu vaccine and pneumonia vaccination at least every 5 years; moderate intensity exercise for up to 150 minutes  weekly; and  sleep for at least 7 hours a day.  - I advised patient to maintain close follow up with Randy Fire, MD for primary care needs.   - Time spent with the patient: 25 min, of which >50% was spent in reviewing his  current and  previous labs, previous treatments, and medications doses and developing a plan for long-term care.  Randy Hebert participated in the discussions, expressed understanding, and voiced agreement with the above plans.  All questions were answered to his satisfaction. he is encouraged to contact clinic should he have any questions or concerns prior to his return visit.   Follow up plan: - Return in about 4 months (around 10/30/2018) for Follow up with Pre-visit Labs.  Glade Lloyd, MD [facility] [address] [place], [place] [address] Phone: [phone]  Fax: [phone]    06/30/2018, 10:13 AM  This note was partially dictated with voice recognition software. Similar sounding words can be transcribed inadequately or may not  be corrected upon review.

## 2018-06-30 NOTE — Patient Instructions (Signed)

## 2018-07-02 DIAGNOSIS — F329 Major depressive disorder, single episode, unspecified: Secondary | ICD-10-CM | POA: Diagnosis not present

## 2018-07-08 DIAGNOSIS — R569 Unspecified convulsions: Secondary | ICD-10-CM | POA: Diagnosis not present

## 2018-07-09 LAB — CUP PACEART REMOTE DEVICE CHECK
Date Time Interrogation Session: 20191019003957
Implantable Pulse Generator Implant Date: 20181029

## 2018-07-12 ENCOUNTER — Ambulatory Visit (INDEPENDENT_AMBULATORY_CARE_PROVIDER_SITE_OTHER): Payer: Medicare Other | Admitting: Cardiology

## 2018-07-12 ENCOUNTER — Encounter: Payer: Self-pay | Admitting: Cardiology

## 2018-07-12 VITALS — BP 146/88 | HR 91 | Ht 66.0 in | Wt 214.4 lb

## 2018-07-12 DIAGNOSIS — I2510 Atherosclerotic heart disease of native coronary artery without angina pectoris: Secondary | ICD-10-CM | POA: Diagnosis not present

## 2018-07-12 DIAGNOSIS — I25110 Atherosclerotic heart disease of native coronary artery with unstable angina pectoris: Secondary | ICD-10-CM | POA: Diagnosis not present

## 2018-07-12 DIAGNOSIS — R079 Chest pain, unspecified: Secondary | ICD-10-CM | POA: Diagnosis not present

## 2018-07-12 NOTE — Progress Notes (Signed)
Clinical Summary Mr. Luczak is a 52 y.o.male  seen today for follow up of the following medical problems. This is a focused visit on recent chest pain.    1. CAD - admit 01/2015 with lateral STEMI, received DES to D1. There was a 70% ramus and 40% mid RCA that was medically managed - 01/2015 echo LVEF 40%, lateral wall hypokinesis - no beta blocker due to history of sinus pause and bradycardia. No ACE-I given history of severe angioedema, no ARB due to risk of cross reactivity - admit 12/2015 with atypical chest pain, worst with palpatoin and movement. Had done some heavy lifting the day it started, negative workup for ACS. Since that time no recurrent symptoms   04/2017 PCI with stent.  - no recent symptoms - compliant with meds   - midchest, pressure. Typically occurs at rest. 3/10 in severity, +SOB, can get hot and sweaty, nauseous. Lasts a few minutes at time, better with SL NG. Has taken total of 4 this week - not positional - symptoms started after finding his son died by overdose, he discovered the body       SH: recently his son overdosed, passed away    Past Medical History:  Diagnosis Date  . Allergy   . Anxiety   . Arthritis   . Asthma    hip replacement  . Back pain   . Bulging of cervical intervertebral disc   . CAD (coronary artery disease)    lateral STEMI 02/06/2015 00% D1 occlusion treated with Promus Premier 2.5 mm x 16 mm DES, 70% ramus stenosis, 40% mid RCA stenosis, 45% distal RCA stenosis, EF 45-50%  . CHF (congestive heart failure) ([place])   . COPD (chronic obstructive pulmonary disease) ([place])   . Depression   . Diabetes mellitus without complication ([place])   . 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 Dry eye   . Dyspnea   . Early satiety 09/23/2016  . GERD (gastroesophageal reflux disease)   . Headache   . Heart murmur   . Hip pain   . Hyperlipidemia   . Hypertension   . MI (myocardial infarction)  ([place])   . Myocardial infarction ([place])   . Neck pain   . Otitis media   . Pleurisy   . Sinus pause    9 sec sinus pause on telemetry after started on coreg after MI, avoid AV nodal blocking agent  . Sleep apnea   . 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 Unilateral primary osteoarthritis, right hip 07/01/2016     Allergies  Allergen Reactions  . Carvedilol Other (See Comments)    Sinus pause on telemetry >3 seconds. Longest one 9 sec. No AV nodal agent  . Lisinopril Anaphylaxis, Shortness Of Breath and Swelling    Angioedema, required intubation and mechanical ventilation  . Amoxicillin Nausea And Vomiting    Nausea only - no allergy     Current Outpatient Medications  Medication Sig Dispense Refill  . acetaminophen (TYLENOL) 325 MG tablet Take 650 mg by mouth every 6 (six) hours as needed for mild pain.    Marland Kitchen albuterol (PROVENTIL) (2.5 MG/3ML) 0.083% nebulizer solution Take 3 mLs (2.5 mg total) by nebulization every 6 (six) hours as needed for wheezing or shortness of breath. 150 mL 1  . amLODipine (NORVASC) 10 MG tablet Take 1 tablet (10 mg total) by mouth daily. 30 tablet 6  . aspirin 81  MG EC tablet Take 1 tablet (81 mg total) by mouth daily. 30 tablet   . atorvastatin (LIPITOR) 40 MG tablet Take 1 tablet (40 mg total) by mouth daily. 30 tablet 6  . Carboxymeth-Glycerin-Polysorb (REFRESH OPTIVE ADVANCED) 0.5-1-0.5 % SOLN Apply 1 drop to eye 2 (two) times daily as needed (dry eyes).     . cetirizine (ZYRTEC) 10 MG tablet Take 10 mg by mouth daily.    . citalopram (CELEXA) 20 MG tablet Take 20 mg by mouth daily.    . cycloSPORINE (RESTASIS) 0.05 % ophthalmic emulsion Place 1 drop into both eyes 2 (two) times daily.    . fluticasone (FLONASE) 50 MCG/ACT nasal spray Place 2 sprays into both nostrils daily. 16 g 6  . furosemide (LASIX) 40 MG tablet TAKE 1 TABLET BY MOUTH ONCE DAILY. 30 tablet 6  . glucose blood (IGLUCOSE TEST STRIPS) test strip 1 each by Other route 2 (two) times  daily. Use as instructed 100 each 12  . Magnesium 200 MG TABS Take 1 tablet (200 mg total) by mouth daily. 30 each 6  . metFORMIN (GLUCOPHAGE) 1000 MG tablet Take 1 tablet (1,000 mg total) by mouth 2 (two) times daily with a meal. 180 tablet 3  . mometasone-formoterol (DULERA) 100-5 MCG/ACT AERO Inhale 2 puffs into the lungs 2 (two) times daily. 3 Inhaler 3  . nicotine (NICODERM CQ - DOSED IN MG/24 HOURS) 21 mg/24hr patch Place 1 patch (21 mg total) onto the skin daily. (Patient taking differently: Place 21 mg onto the skin daily. ) 28 patch 0  . nitroGLYCERIN (NITROSTAT) 0.4 MG SL tablet Place 1 tablet (0.4 mg total) under the tongue every 5 (five) minutes as needed for chest pain. 25 tablet 0  . pantoprazole (PROTONIX) 40 MG tablet TAKE 1 TABLET BY MOUTH ONCE A DAY. 90 tablet 3  . potassium chloride SA (K-DUR,KLOR-CON) 20 MEQ tablet TAKE 1 TABLET BY MOUTH DAILY. 30 tablet 0  . prasugrel (EFFIENT) 10 MG TABS tablet TAKE 1 TABLET BY MOUTH ONCE A DAY. 30 tablet 10  . PROVENTIL HFA 108 (90 Base) MCG/ACT inhaler INHALE 2 PUFFS INTO THE LUNGS EVERY 6 HOURS AS NEEDED FOR SHORTNESS OF BREATH OR WHEEZING. 18 g 1  . traZODone (DESYREL) 100 MG tablet Take 100 mg by mouth at bedtime.      No current facility-administered medications for this visit.      Past Surgical History:  Procedure Laterality Date  . BIOPSY  10/09/2016   Procedure: BIOPSY;  Surgeon: Daneil Dolin, MD;  [facility];  Service: Endoscopy;;  . CARDIAC CATHETERIZATION N/A 02/06/2015   Procedure: Left Heart Cath and Coronary Angiography;  Surgeon: Leonie Man, MD;  Location: [facility];  Service: Cardiovascular;  Laterality: N/A;  . CARDIAC CATHETERIZATION N/A 02/06/2015   Procedure: Coronary Stent Intervention;  Surgeon: Leonie Man, MD;  Location: [facility];  Service: Cardiovascular;  Laterality: N/A;  . COLONOSCOPY WITH PROPOFOL N/A 10/09/2016   Procedure: COLONOSCOPY WITH PROPOFOL;  Surgeon: Daneil Dolin, MD;  [facility];  Service: Endoscopy;  Laterality: N/A;  12:00 pm  . COLONOSCOPY WITH PROPOFOL N/A 11/24/2016   Procedure: COLONOSCOPY WITH PROPOFOL;  Surgeon: Daneil Dolin, MD;  [facility];  Service: Endoscopy;  Laterality: N/A;  200  . CORONARY ANGIOPLASTY WITH STENT PLACEMENT  01/2015  . CORONARY STENT INTERVENTION N/A 04/27/2017   Procedure: CORONARY STENT INTERVENTION;  Surgeon: Nelva Bush, MD;  Location: [facility];  Service: Cardiovascular;  Laterality: N/A;  . ELECTROPHYSIOLOGY STUDY N/A 06/29/2017   Procedure: ELECTROPHYSIOLOGY STUDY;  Surgeon: Evans Lance, MD;  Location: [facility];  Service: Cardiovascular;  Laterality: N/A;  . ESOPHAGOGASTRODUODENOSCOPY (EGD) WITH PROPOFOL N/A 10/09/2016   Procedure: ESOPHAGOGASTRODUODENOSCOPY (EGD) WITH PROPOFOL;  Surgeon: Daneil Dolin, MD;  [facility];  Service: Endoscopy;  Laterality: N/A;  . INCISION / DRAINAGE HAND / FINGER    . LEFT HEART CATH AND CORONARY ANGIOGRAPHY N/A 04/27/2017   Procedure: LEFT HEART CATH AND CORONARY ANGIOGRAPHY;  Surgeon: Nelva Bush, MD;  Location: [facility];  Service: Cardiovascular;  Laterality: N/A;  . LOOP RECORDER INSERTION  06/29/2017   Procedure: Loop Recorder Insertion;  Surgeon: Evans Lance, MD;  Location: [facility];  Service: Cardiovascular;;  . POLYPECTOMY  11/24/2016   Procedure: POLYPECTOMY;  Surgeon: Daneil Dolin, MD;  [facility];  Service: Endoscopy;;  descending and sigmoid  . TOTAL HIP ARTHROPLASTY Right 07/01/2016  . TOTAL HIP ARTHROPLASTY Right 07/01/2016   Procedure: RIGHT TOTAL HIP ARTHROPLASTY ANTERIOR APPROACH;  Surgeon: Mcarthur Rossetti, MD;  Location: [facility];  Service: Orthopedics;  Laterality: Right;     Allergies  Allergen Reactions  . Carvedilol Other (See Comments)    Sinus pause on telemetry >3 seconds. Longest one 9 sec. No AV nodal agent  . Lisinopril Anaphylaxis,  Shortness Of Breath and Swelling    Angioedema, required intubation and mechanical ventilation  . Amoxicillin Nausea And Vomiting    Nausea only - no allergy      Family History  Problem Relation Age of Onset  . Heart attack Father   . Stroke Father   . Arthritis Father   . Heart disease Father   . Cancer Mother        ???  . Arthritis Mother   . Heart disease Brother 102       died in sleep  . Early death Brother   . Diabetes Maternal Uncle   . Alzheimer's disease Maternal Grandmother      Social History Mr. Kerschner reports that he has been smoking cigarettes. He started smoking about 39 years ago. He has a 46.00 pack-year smoking history. He has quit using smokeless tobacco.  His smokeless tobacco use included chew. Mr. Nordquist reports that he drinks about 11.0 - 12.0 standard drinks of alcohol per week.   Review of Systems CONSTITUTIONAL: No weight loss, fever, chills, weakness or fatigue.  HEENT: Eyes: No visual loss, blurred vision, double vision or yellow sclerae.No hearing loss, sneezing, congestion, runny nose or sore throat.  SKIN: No rash or itching.  CARDIOVASCULAR: per hpi RESPIRATORY: per hpi GASTROINTESTINAL: No anorexia, nausea, vomiting or diarrhea. No abdominal pain or blood.  GENITOURINARY: No burning on urination, no polyuria NEUROLOGICAL: No headache, dizziness, syncope, paralysis, ataxia, numbness or tingling in the extremities. No change in bowel or bladder control.  MUSCULOSKELETAL: No muscle, back pain, joint pain or stiffness.  LYMPHATICS: No enlarged nodes. No history of splenectomy.  PSYCHIATRIC: No history of depression or anxiety.  ENDOCRINOLOGIC: No reports of sweating, cold or heat intolerance. No polyuria or polydipsia.  Marland Kitchen   Physical Examination Vitals:   07/12/18 1442  BP: (!) 146/88  Pulse: 91  SpO2: 93%   Vitals:   07/12/18 1442  Weight: 214 lb 6.4 oz (97.3 kg)  Height: 5\' 6"  (1.676 m)    Gen: resting comfortably, no acute  distress HEENT: no scleral icterus, pupils equal round and reactive, no palptable cervical adenopathy,  CV: RRR, no m/r/g, no jvd Resp: Clear to auscultation bilaterally GI: abdomen is soft, non-tender, non-distended, normal bowel sounds, no hepatosplenomegaly MSK: extremities are warm, no edema.  Skin: warm, no rash Neuro:  no focal deficits Psych: appropriate affect   Diagnostic Studies 01/2015 cath 1. 1st Diag lesion, 100% stenosed. A Promus Premier 2.5 mm x 16 mm drug-eluting stent was placed. There is a 0% residual stenosis post intervention. 2. Ramus lesion, 70% stenosed. 3. Mid RCA lesion, 40% stenosed. Dist RCA lesion, 45% stenosed. 4. Mild to moderately reduced LVEF with anterolateral and apical hypokinesis and elevated LVEDP  Abnormal anatomy with equal sized Diagonal and LAD Odell Choung but extensive RCA. Successful PCI of the First Diagonal Deandrea Rion.  Recommendations:  Standard post radial cath/PCI TR band removal  Dual independent therapy for minimum one year.  Check 2-D echocardiogram to better assess EF  Add statin and beta blocker. Smoking cessation counseling. Cardiac Rehabilitation And Case Management Consultation  If hemodynamic stable, would consider fast-track discharge  Would consider noninvasive evaluation of the Ramus Intermedius lesion in the absence of any recurrent anginal pain.  01/2015 echo Study Conclusions  - Left ventricle: Moderately severe hypokinesis of mid anterolateral segment, apical lateral segment, mid/apical anterior segments. EF is 40%. The cavity size was normal. Wall thickness was increased in a pattern of mild LVH. - Aortic valve: Sclerosis without stenosis. There was no significant regurgitation. - Right ventricle: The cavity size was normal. Systolic function was normal.  07/2015 CT PE IMPRESSION: 1. Suboptimal contrast bolus timing in the pulmonary arterial tree, poor in the lobar vessels other than the right lower  lobe. No central pulmonary embolus. If there is continued suspicion of acute PE nuclear medicine V/Q scan may be most helpful. 2. Situs abnormality in the chest with right side aortic arch and descending aorta. Mirror image branching. Other visible situs including cardiac and upper abdominal situs appear normal. 3. Calcified Coronary artery atherosclerosis. Hepatomegaly with fatty liver disease.  07/2015 PFTs: mild to mod ventilatory defect with small airway obstruction, moderately reduced DLCO.      Assessment and Plan  1. CAD - recent mixed symptoms, started after severe emotional stress after finding his son passed away from an overdose - unclear if severe anxiety/grief related symptoms or related to his known CAD with intervention as recent as last year - we will obtain a nuclear stress test to further evaluate - EKG today shows SR, no acute ischemic changes      Arnoldo Lenis, M.D.

## 2018-07-12 NOTE — Patient Instructions (Signed)
Medication Instructions:   Your physician recommends that you continue on your current medications as directed. Please refer to the Current Medication list given to you today.  If you need a refill on your cardiac medications before your next appointment, please call your pharmacy.   Lab work: None If you have labs (blood work) drawn today and your tests are completely normal, you will receive your results only by: Marland Kitchen MyChart Message (if you have MyChart) OR . A paper copy in the mail If you have any lab test that is abnormal or we need to change your treatment, we will call you to review the results.  Testing/Procedures: Your physician has requested that you have a lexiscan myoview. For further information please visit [email or web address]. Please follow instruction sheet, as given.    Follow-Up: PENDING   Any Other Special Instructions Will Be Listed Below (If Applicable). NONE

## 2018-07-14 ENCOUNTER — Ambulatory Visit (INDEPENDENT_AMBULATORY_CARE_PROVIDER_SITE_OTHER): Payer: Medicare Other | Admitting: Allergy & Immunology

## 2018-07-14 ENCOUNTER — Encounter: Payer: Self-pay | Admitting: Allergy & Immunology

## 2018-07-14 VITALS — BP 138/84 | HR 98 | Temp 97.9°F | Resp 20 | Ht 64.5 in | Wt 210.0 lb

## 2018-07-14 DIAGNOSIS — J449 Chronic obstructive pulmonary disease, unspecified: Secondary | ICD-10-CM

## 2018-07-14 DIAGNOSIS — J310 Chronic rhinitis: Secondary | ICD-10-CM | POA: Diagnosis not present

## 2018-07-14 DIAGNOSIS — I25110 Atherosclerotic heart disease of native coronary artery with unstable angina pectoris: Secondary | ICD-10-CM

## 2018-07-14 MED ORDER — TIOTROPIUM BROMIDE MONOHYDRATE 2.5 MCG/ACT IN AERS: 1.0000 | INHALATION_SPRAY | Freq: Every day | RESPIRATORY_TRACT | 5 refills | Status: DC

## 2018-07-14 MED ORDER — BUDESONIDE-FORMOTEROL FUMARATE 160-4.5 MCG/ACT IN AERO: 2.0000 | INHALATION_SPRAY | Freq: Two times a day (BID) | RESPIRATORY_TRACT | 5 refills | Status: DC

## 2018-07-14 NOTE — Progress Notes (Signed)
NEW PATIENT  Date of Service/Encounter:  07/14/18  Referring provider: Rosita Fire, MD   Assessment:   Asthma-COPD overlap syndrome  Chronic rhinitis  History of MI - undergoing stress test tomorrow  Adverse social situation - with recent passing of son two weeks ago due to overdose   Randy Hebert presents to establish care for his COPD as well as chronic rhinitis.  Unfortunately, his pulmonary status precludes [place] from doing any skin testing today.  We will instead get blood work to evaluate for environmental allergies.  If this is unrevealing, we may bring him back in for skin testing.  I did not want to do skin testing especially since he is getting a stress test tomorrow and there is concern for possible ischemic heart disease.  Regarding his breathing, we did do a nebulizer treatment which showed an improvement in the FEF 25-75%.  This is a highly variable number, but certainly can point to reversibility.  His flow volume loop looks better following the treatment as well.  Given this, we will add on a LAMA.  We are also going to change his Dulera to high-dose Symbicort since this is better covered by Medicare.  He does have a spacer but we did reiterate how to use the spacer.  We will see him back in 4 weeks to assess his progress and make any changes.    Plan/Recommendations:   1. Asthma-COPD overlap syndrome -Your lung function was around 50 or 60% of normal, but it did improve slightly with the nebulizer treatment. -We will change you from [facility] to Symbicort since this is more likely to be covered by Medicare. -We will also add on Spiriva 1 inhalation once daily to see if you have any benefit from this as well. -Spiriva opens up your lungs using a different receptor than the albuterol. - Spacer use reviewed. - Daily controller medication(s): Spiriva 2.27mcg one inhalation once daily and Symbicort 160/4.86mcg two puffs twice daily with spacer - Prior to physical activity:  Ventolin 2 puffs 10-15 minutes before physical activity. - Rescue medications: Ventolin 4 puffs every 4-6 hours as needed - Asthma control goals:  * Full participation in all desired activities (may need albuterol before activity) * Albuterol use two time or less a week on average (not counting use with activity) * Cough interfering with sleep two time or less a month * Oral steroids no more than once a year * No hospitalizations  2. Chronic rhinitis -We will get blood work instead of skin testing since you are undergoing a stress test tomorrow. -I do not want to do anything that might compromise your cardiac health. -In the meantime, continue with fluticasone 2 sprays per nostril daily as well as cetirizine 10 mg daily. -Add on Astelin 2 sprays per nostril up to twice daily as needed on the worst days. -Consider using nasal saline rinses 1-2 times daily to thin out your mucus and remove allergens from her nose. -We can consider allergy shots for long-term control, pending the lab results.  3. Return in about 4 weeks (around 08/11/2018).   Subjective:   Randy Hebert is a 52 y.o. male presenting today for evaluation of  Chief Complaint  Patient presents with  . Allergic Rhinitis     sneezes 2-3 times daily, seems to be after he eats mostly. runny nose, itching and watering eyes.   . Food Intolerance    if he consumes dairy after consumption of fish he has vomiting episodes.  Randy Hebert has a history of the following: Patient Active Problem List   Diagnosis Date Noted  . Weight gain 06/30/2018  . Vitamin D deficiency 06/30/2018  . Alcohol abuse 06/30/2018  . DM type 2 causing vascular disease ([place]) 08/17/2017  . Syncope 06/29/2017  . HCAP (healthcare-associated pneumonia) 05/15/2017  . Uncontrolled type 2 diabetes mellitus with hyperglycemia, without long-term current use of insulin ([place]) 05/15/2017  . Acute on chronic respiratory failure with hypoxia ([place])  05/15/2017  . Lobar pneumonia ([place]) 05/15/2017  . Chronic combined systolic and diastolic CHF (congestive heart failure) ([place]) 05/15/2017  . Sinus pause 05/15/2017  . Syncope and collapse 05/15/2017  . Bradycardia 04/28/2017  . Sleep apnea 04/28/2017  . Non-ST elevation (NSTEMI) myocardial infarction ([place]) 04/27/2017  . NSTEMI (non-ST elevated myocardial infarction) ([place]) 04/26/2017  . Substance abuse ([place]) 04/15/2017  . Depression with anxiety 04/15/2017  . GERD (gastroesophageal reflux disease) 09/23/2016  . 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 Chronic obstructive pulmonary disease ([place]) 08/27/2015  . Cigarette nicotine dependence with nicotine-induced disorder 08/27/2015  . Hyperlipidemia 08/27/2015  . Class 2 severe obesity due to excess calories with serious comorbidity and body mass index (BMI) of 36.0 to 36.9 in adult [facility]) 08/27/2015  . Acute on chronic systolic heart failure ([place]) 06/02/2015  . ACE inhibitor-aggravated angioedema   . Chronic systolic CHF (congestive heart failure) ([place]) 06/01/2015  . CAD (coronary artery disease) 05/31/2015  . STEMI (ST elevation myocardial infarction) ([place]) 05/31/2015  . Current smoker 05/31/2015  . Essential hypertension 02/09/2015    History obtained from: chart review and patient.  Randy Hebert was referred by Rosita Fire, MD.     Randy Hebert is a 52 y.o. male presenting to establish care for asthma and allergies.   Asthma/Respiratory Symptom History: He does have COPD which was diagnosed in 2016. He had an MI in June of that year. He was prescribed lisinopril. He had an allergic reactions around August 2016 to the lisinopril which resulted in swelling and closure of his esophagus. He prolonged it enough such that he had problems breathing ever since then. He was started on O2 supplementation at one point but this has been weaned. He was started on Dulera 100/5 two puffs BID.   Of note, he did recently develop  chest pains around the end of October 2019. This was around the time that his son passed away from a drug overdose. He had three sharp hits this morning and his BP was normal. He is undergoing a stress test tomorrow to make sure that there are no problems with a clogged artery.   Allergic Rhinitis Symptom History: He reports chronic congestion and sneezing. He does have itchy watery eyes. He estimates that he has had this for 10+ years. 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 Spring and summer are actually fairly good. Winter is perfect without any symptoms at all. During the fall, he just feels terrible and "drained". He does report some sinus infections, but he doe not always get antibiotics for it. He does have CPAP machine and cleans it once per week. He has never been tested in the past. He was started a nasal spray and cetirizine 10mg . It seems that he was started a prednisone taper.   He does have diabetes and sees Dr. Dorris Fetch. He also has a history of drug abuse when he was younger. He is not working at this time,  but does mow a few yards. Otherwise, there is no history of other atopic diseases, including food allergies, stinging insect allergies, eczema or urticaria. There is no significant infectious history. Vaccinations are up to date.    Past Medical History: Patient Active Problem List   Diagnosis Date Noted  . Weight gain 06/30/2018  . Vitamin D deficiency 06/30/2018  . Alcohol abuse 06/30/2018  . DM type 2 causing vascular disease ([place]) 08/17/2017  . Syncope 06/29/2017  . HCAP (healthcare-associated pneumonia) 05/15/2017  . Uncontrolled type 2 diabetes mellitus with hyperglycemia, without long-term current use of insulin ([place]) 05/15/2017  . Acute on chronic respiratory failure with hypoxia ([place]) 05/15/2017  . Lobar pneumonia ([place]) 05/15/2017  . Chronic combined systolic and diastolic CHF (congestive heart failure) ([place]) 05/15/2017  . Sinus pause  05/15/2017  . Syncope and collapse 05/15/2017  . Bradycardia 04/28/2017  . Sleep apnea 04/28/2017  . Non-ST elevation (NSTEMI) myocardial infarction ([place]) 04/27/2017  . NSTEMI (non-ST elevated myocardial infarction) ([place]) 04/26/2017  . Substance abuse ([place]) 04/15/2017  . Depression with anxiety 04/15/2017  . GERD (gastroesophageal reflux disease) 09/23/2016  . 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 Chronic obstructive pulmonary disease ([place]) 08/27/2015  . Cigarette nicotine dependence with nicotine-induced disorder 08/27/2015  . Hyperlipidemia 08/27/2015  . Class 2 severe obesity due to excess calories with serious comorbidity and body mass index (BMI) of 36.0 to 36.9 in adult [facility]) 08/27/2015  . Acute on chronic systolic heart failure ([place]) 06/02/2015  . ACE inhibitor-aggravated angioedema   . Chronic systolic CHF (congestive heart failure) ([place]) 06/01/2015  . CAD (coronary artery disease) 05/31/2015  . STEMI (ST elevation myocardial infarction) ([place]) 05/31/2015  . Current smoker 05/31/2015  . Essential hypertension 02/09/2015    Medication List:  Allergies as of 07/14/2018      Reactions   Carvedilol Other (See Comments)   Sinus pause on telemetry >3 seconds. Longest one 9 sec. No AV nodal agent   Lisinopril Anaphylaxis, Shortness Of Breath, Swelling   Angioedema, required intubation and mechanical ventilation   Amoxicillin Nausea And Vomiting   Nausea only - no allergy      Medication List        Accurate as of 07/14/18  9:58 AM. Always use your most recent med list.          acetaminophen 325 MG tablet Commonly known as:  TYLENOL Take 650 mg by mouth every 6 (six) hours as needed for mild pain.   albuterol (2.5 MG/3ML) 0.083% nebulizer solution Commonly known as:  PROVENTIL Take 3 mLs (2.5 mg total) by nebulization every 6 (six) hours as needed for wheezing or shortness of breath.   PROVENTIL HFA 108 (90 Base) MCG/ACT inhaler Generic drug:   albuterol INHALE 2 PUFFS INTO THE LUNGS EVERY 6 HOURS AS NEEDED FOR SHORTNESS OF BREATH OR WHEEZING.   amLODipine 10 MG tablet Commonly known as:  NORVASC Take 1 tablet (10 mg total) by mouth daily.   aspirin 81 MG EC tablet Take 1 tablet (81 mg total) by mouth daily.   atorvastatin 40 MG tablet Commonly known as:  LIPITOR Take 1 tablet (40 mg total) by mouth daily.   budesonide-formoterol 160-4.5 MCG/ACT inhaler Commonly known as:  SYMBICORT Inhale 2 puffs into the lungs 2 (two) times daily.   CELEXA 20 MG tablet Generic drug:  citalopram Take 20 mg by mouth daily.   cetirizine 10 MG tablet Commonly known as:  ZYRTEC Take 10 mg by mouth  daily.   cycloSPORINE 0.05 % ophthalmic emulsion Commonly known as:  RESTASIS Place 1 drop into both eyes 2 (two) times daily.   fluticasone 50 MCG/ACT nasal spray Commonly known as:  FLONASE Place 2 sprays into both nostrils daily.   furosemide 40 MG tablet Commonly known as:  LASIX TAKE 1 TABLET BY MOUTH ONCE DAILY.   glucose blood test strip 1 each by Other route 2 (two) times daily. Use as instructed   Magnesium 200 MG Tabs Take 1 tablet (200 mg total) by mouth daily.   metFORMIN 1000 MG tablet Commonly known as:  GLUCOPHAGE Take 1 tablet (1,000 mg total) by mouth 2 (two) times daily with a meal.   mometasone-formoterol 100-5 MCG/ACT Aero Commonly known as:  DULERA Inhale 2 puffs into the lungs 2 (two) times daily.   nitroGLYCERIN 0.4 MG SL tablet Commonly known as:  NITROSTAT Place 1 tablet (0.4 mg total) under the tongue every 5 (five) minutes as needed for chest pain.   pantoprazole 40 MG tablet Commonly known as:  PROTONIX TAKE 1 TABLET BY MOUTH ONCE A DAY.   potassium chloride SA 20 MEQ tablet Commonly known as:  K-DUR,KLOR-CON TAKE 1 TABLET BY MOUTH DAILY.   prasugrel 10 MG Tabs tablet Commonly known as:  EFFIENT TAKE 1 TABLET BY MOUTH ONCE A DAY.   REFRESH OPTIVE ADVANCED 0.5-1-0.5 % Soln Generic drug:   Carboxymeth-Glycerin-Polysorb Apply 1 drop to eye 2 (two) times daily as needed (dry eyes).   traZODone 100 MG tablet Commonly known as:  DESYREL Take 100 mg by mouth at bedtime.       Birth History: non-contributory  Developmental History: non-contributory  Past Surgical History: Past Surgical History:  Procedure Laterality Date  . BIOPSY  10/09/2016   Procedure: BIOPSY;  Surgeon: Daneil Dolin, MD;  [facility];  Service: Endoscopy;;  . CARDIAC CATHETERIZATION N/A 02/06/2015   Procedure: Left Heart Cath and Coronary Angiography;  Surgeon: Leonie Man, MD;  Location: [place] [facility];  Service: Cardiovascular;  Laterality: N/A;  . CARDIAC CATHETERIZATION N/A 02/06/2015   Procedure: Coronary Stent Intervention;  Surgeon: Leonie Man, MD;  Location: [place] [facility];  Service: Cardiovascular;  Laterality: N/A;  . COLONOSCOPY WITH PROPOFOL N/A 10/09/2016   Procedure: COLONOSCOPY WITH PROPOFOL;  Surgeon: Daneil Dolin, MD;  [facility];  Service: Endoscopy;  Laterality: N/A;  12:00 pm  . COLONOSCOPY WITH PROPOFOL N/A 11/24/2016   Procedure: COLONOSCOPY WITH PROPOFOL;  Surgeon: Daneil Dolin, MD;  [facility];  Service: Endoscopy;  Laterality: N/A;  200  . CORONARY ANGIOPLASTY WITH STENT PLACEMENT  01/2015  . CORONARY STENT INTERVENTION N/A 04/27/2017   Procedure: CORONARY STENT INTERVENTION;  Surgeon: Nelva Bush, MD;  Location: [place] [facility];  Service: Cardiovascular;  Laterality: N/A;  . ELECTROPHYSIOLOGY STUDY N/A 06/29/2017   Procedure: ELECTROPHYSIOLOGY STUDY;  Surgeon: Evans Lance, MD;  Location: [place] [facility];  Service: Cardiovascular;  Laterality: N/A;  . ESOPHAGOGASTRODUODENOSCOPY (EGD) WITH PROPOFOL N/A 10/09/2016   Procedure: ESOPHAGOGASTRODUODENOSCOPY (EGD) WITH PROPOFOL;  Surgeon: Daneil Dolin, MD;  [facility];  Service: Endoscopy;  Laterality: N/A;  . INCISION / DRAINAGE HAND / FINGER    . LEFT  HEART CATH AND CORONARY ANGIOGRAPHY N/A 04/27/2017   Procedure: LEFT HEART CATH AND CORONARY ANGIOGRAPHY;  Surgeon: Nelva Bush, MD;  Location: [place] [facility];  Service: Cardiovascular;  Laterality: N/A;  . LOOP RECORDER INSERTION  06/29/2017   Procedure: Loop Recorder Insertion;  Surgeon: Evans Lance, MD;  Location: [place] [facility];  Service: Cardiovascular;;  . POLYPECTOMY  11/24/2016   Procedure: POLYPECTOMY;  Surgeon: Daneil Dolin, MD;  [facility];  Service: Endoscopy;;  descending and sigmoid  . TOTAL HIP ARTHROPLASTY Right 07/01/2016  . TOTAL HIP ARTHROPLASTY Right 07/01/2016   Procedure: RIGHT TOTAL HIP ARTHROPLASTY ANTERIOR APPROACH;  Surgeon: Mcarthur Rossetti, MD;  Location: [place];  Service: Orthopedics;  Laterality: Right;     Family History: Family History  Problem Relation Age of Onset  . Heart attack Father   . Stroke Father   . Arthritis Father   . Heart disease Father   . Cancer Mother        ???  . Arthritis Mother   . Heart disease Brother 75       died in sleep  . Early death Brother   . Diabetes Maternal Uncle   . Alzheimer's disease Maternal Grandmother      Social History: Denard lives at home in a house that is 53 years old.  There is carpeting throughout the home.  He has electric heating and central cooling.  There is a cat and a dog in the home.  He does have dust mite covers on his bed, but not his pillows.  There is tobacco exposure.  He smokes 1/2 packs/day since 1979.     Review of Systems: a 14-point review of systems is pertinent for what is mentioned in HPI.  Otherwise, all other systems were negative. Constitutional: negative other than that listed in the HPI Eyes: negative other than that listed in the HPI Ears, nose, mouth, throat, and face: negative other than that listed in the HPI Respiratory: negative other than that listed in the HPI Cardiovascular: negative other than that listed in the  HPI Gastrointestinal: negative other than that listed in the HPI Genitourinary: negative other than that listed in the HPI Integument: negative other than that listed in the HPI Hematologic: negative other than that listed in the HPI Musculoskeletal: negative other than that listed in the HPI Neurological: negative other than that listed in the HPI Allergy/Immunologic: negative other than that listed in the HPI    Objective:   Blood pressure 138/84, pulse 98, temperature 97.9 F (36.6 C), temperature source Oral, resp. rate 20, height 5' 4.5" (1.638 m), weight 210 lb (95.3 kg), SpO2 94 %. Body mass index is 35.49 kg/m.   Physical Exam:  General: Alert, interactive, in no acute distress. Somewhat sullen since he son passed away two weeks ago.  Eyes: No conjunctival injection bilaterally, no discharge on the right, no discharge on the left, no Horner-Trantas dots present and allergic shiners present bilaterally. PERRL bilaterally. EOMI without pain. No photophobia.  Ears: Right TM pearly gray with normal light reflex, Left TM pearly gray with normal light reflex, Right TM intact without perforation and Left TM intact without perforation.  Nose/Throat: External nose within normal limits and septum midline. Turbinates edematous and pale with clear discharge. Posterior oropharynx mildly erythematous without cobblestoning in the posterior oropharynx. Tonsils 2+ without exudates.  Tongue without thrush. Neck: Supple without thyromegaly. Trachea midline. Adenopathy: no enlarged lymph nodes appreciated in the anterior cervical, occipital, axillary, epitrochlear, inguinal, or popliteal regions. Lungs: Decreased breath sounds bilaterally without wheezing, rhonchi or rales. No increased work of breathing. CV: Normal S1/S2. No murmurs. Capillary refill <2 seconds.  Abdomen: Nondistended, nontender. No guarding or rebound  tenderness. Bowel sounds present in all fields and hyperactive  Skin: Warm and  dry, without lesions or rashes. Extremities:  No clubbing, cyanosis or edema. Neuro:   Grossly intact. No focal deficits appreciated. Responsive to questions.  Diagnostic studies:   Spirometry: results abnormal (FEV1: 1.79/59%, FVC: 2.50/62%, FEV1/FVC: 71%).    Spirometry consistent with possible restrictive disease. Xopenex nebulizer treatment given in clinic with improvement in FEV1, but not significant per ATS criteria. However, the FEF 25-75% increased 58%.   Allergy Studies: none (deferred due to current pulmonary state)       Salvatore Marvel, MD [facility]

## 2018-07-14 NOTE — Patient Instructions (Addendum)
1. Asthma-COPD overlap syndrome -Your lung function was around 50 or 60% of normal, but it did improve slightly with the nebulizer treatment. -We will change you from [facility] to Symbicort since this is more likely to be covered by Medicare. -We will also add on Spiriva 1 inhalation once daily to see if you have any benefit from this as well. -Spiriva opens up your lungs using a different receptor than the albuterol. - Spacer use reviewed. - Daily controller medication(s): Spiriva 2.108mcg one inhalation once daily and Symbicort 160/4.76mcg two puffs twice daily with spacer - Prior to physical activity: Ventolin 2 puffs 10-15 minutes before physical activity. - Rescue medications: Ventolin 4 puffs every 4-6 hours as needed - Asthma control goals:  * Full participation in all desired activities (may need albuterol before activity) * Albuterol use two time or less a week on average (not counting use with activity) * Cough interfering with sleep two time or less a month * Oral steroids no more than once a year * No hospitalizations  2. Chronic rhinitis -We will get blood work instead of skin testing since you are undergoing a stress test tomorrow. -I do not want to do anything that might compromise your cardiac health. -In the meantime, continue with fluticasone 2 sprays per nostril daily as well as cetirizine 10 mg daily. -Add on Astelin 2 sprays per nostril up to twice daily as needed on the worst days. -Consider using nasal saline rinses 1-2 times daily to thin out your mucus and remove allergens from her nose. -We can consider allergy shots for long-term control, pending the lab results.  3. Return in about 4 weeks (around 08/11/2018).   Please inform us of any Emergency Department visits, hospitalizations, or changes in symptoms. Call us before going to the ED for breathing or allergy symptoms since we might be able to fit you in for a sick visit. Feel free to contact us anytime with any  questions, problems, or concerns.  It was a pleasure to meet you today!  Websites that have reliable patient information: 1. American Academy of Asthma, Allergy, and Immunology: www.aaaai.org 2. Food Allergy Research and Education (FARE): foodallergy.org 3. Mothers of Asthmatics: http://www.asthmacommunitynetwork.org 4. American College of Allergy, Asthma, and Immunology: MonthlyElectricBill.co.uk   Make sure you are registered to vote! If you have moved or changed any of your contact information, you will need to get this updated before voting!

## 2018-07-14 NOTE — Addendum Note (Signed)
Addended by: Valentina Shaggy on: 07/14/2018 10:03 AM   Modules accepted: Orders

## 2018-07-15 ENCOUNTER — Encounter (HOSPITAL_COMMUNITY): Payer: Self-pay

## 2018-07-15 ENCOUNTER — Encounter (HOSPITAL_BASED_OUTPATIENT_CLINIC_OR_DEPARTMENT_OTHER)
Admission: RE | Admit: 2018-07-15 | Discharge: 2018-07-15 | Disposition: A | Discharge status: Home / Self Care | Payer: Medicare Other | Source: Ambulatory Visit | Attending: Cardiology | Admitting: Cardiology

## 2018-07-15 ENCOUNTER — Encounter (HOSPITAL_COMMUNITY)
Admission: RE | Admit: 2018-07-15 | Discharge: 2018-07-15 | Disposition: A | Discharge status: Home / Self Care | Payer: Medicare Other | Source: Ambulatory Visit | Attending: Cardiology | Admitting: Cardiology

## 2018-07-15 DIAGNOSIS — R079 Chest pain, unspecified: Secondary | ICD-10-CM | POA: Insufficient documentation

## 2018-07-15 LAB — NM MYOCAR MULTI W/SPECT W/WALL MOTION / EF
CHL CUP NUCLEAR SDS: 1
CHL CUP NUCLEAR SRS: 8
CHL CUP RESTING HR STRESS: 75 {beats}/min
CSEPPHR: 108 {beats}/min
LV dias vol: 136 mL (ref 62–150)
LV sys vol: 64 mL
RATE: 0.3
SSS: 9
TID: 0.89

## 2018-07-15 MED ORDER — TECHNETIUM TC 99M TETROFOSMIN IV KIT
30.0000 | PACK | Freq: Once | INTRAVENOUS | Status: AC | PRN
  Administered 2018-07-15: 33 via INTRAVENOUS

## 2018-07-15 MED ORDER — TECHNETIUM TC 99M TETROFOSMIN IV KIT
10.0000 | PACK | Freq: Once | INTRAVENOUS | Status: AC | PRN
  Administered 2018-07-15: 10.9 via INTRAVENOUS

## 2018-07-15 MED ORDER — SODIUM CHLORIDE 0.9% FLUSH
INTRAVENOUS | Status: AC
  Administered 2018-07-15: 10 mL via INTRAVENOUS
  Filled 2018-07-15: qty 10

## 2018-07-15 MED ORDER — REGADENOSON 0.4 MG/5ML IV SOLN
INTRAVENOUS | Status: AC
  Administered 2018-07-15: 0.4 mg via INTRAVENOUS
  Filled 2018-07-15: qty 5

## 2018-07-21 ENCOUNTER — Ambulatory Visit (INDEPENDENT_AMBULATORY_CARE_PROVIDER_SITE_OTHER): Payer: Medicare Other

## 2018-07-21 DIAGNOSIS — R55 Syncope and collapse: Secondary | ICD-10-CM

## 2018-07-22 NOTE — Progress Notes (Signed)
[facility] Summary Report / Loop Recorder 

## 2018-07-26 DIAGNOSIS — K219 Gastro-esophageal reflux disease without esophagitis: Secondary | ICD-10-CM | POA: Diagnosis not present

## 2018-07-26 DIAGNOSIS — J310 Chronic rhinitis: Secondary | ICD-10-CM | POA: Diagnosis not present

## 2018-07-26 DIAGNOSIS — I2510 Atherosclerotic heart disease of native coronary artery without angina pectoris: Secondary | ICD-10-CM | POA: Diagnosis not present

## 2018-07-26 DIAGNOSIS — R197 Diarrhea, unspecified: Secondary | ICD-10-CM | POA: Diagnosis not present

## 2018-07-29 LAB — IGE+ALLERGENS ZONE 2(30)
Alternaria Alternata IgE: <0.1 kU/L
Amer Sycamore IgE Qn: <0.1 kU/L
Aspergillus Fumigatus IgE: <0.1 kU/L
Bahia Grass IgE: <0.1 kU/L
Bermuda Grass IgE: <0.1 kU/L
Cat Dander IgE: <0.1 kU/L
Cedar, Mountain IgE: <0.1 kU/L
Cladosporium Herbarum IgE: <0.1 kU/L
Cockroach, American IgE: <0.1 kU/L
Common Silver Birch IgE: <0.1 kU/L
D Farinae IgE: <0.1 kU/L
D Pteronyssinus IgE: <0.1 kU/L
Elm, American IgE: <0.1 kU/L
IGE (IMMUNOGLOBULIN E), SERUM: 111 [IU]/mL (ref 6–495)
Johnson Grass IgE: <0.1 kU/L
Maple/Box Elder IgE: <0.1 kU/L
Mucor Racemosus IgE: <0.1 kU/L
Mugwort IgE Qn: <0.1 kU/L
Nettle IgE: <0.1 kU/L
Ragweed, Short IgE: <0.1 kU/L
Sweet gum IgE RAST Ql: <0.1 kU/L
Timothy Grass IgE: <0.1 kU/L
White Mulberry IgE: <0.1 kU/L

## 2018-08-04 ENCOUNTER — Encounter: Payer: Self-pay | Admitting: Gastroenterology

## 2018-08-04 ENCOUNTER — Ambulatory Visit (INDEPENDENT_AMBULATORY_CARE_PROVIDER_SITE_OTHER): Payer: Medicare Other | Admitting: Gastroenterology

## 2018-08-04 VITALS — BP 150/80 | HR 93 | Temp 97.5°F | Ht 65.0 in | Wt 210.6 lb

## 2018-08-04 DIAGNOSIS — R740 Nonspecific elevation of levels of transaminase and lactic acid dehydrogenase [LDH]: Secondary | ICD-10-CM

## 2018-08-04 DIAGNOSIS — K921 Melena: Secondary | ICD-10-CM

## 2018-08-04 DIAGNOSIS — R7401 Elevation of levels of liver transaminase levels: Secondary | ICD-10-CM

## 2018-08-04 DIAGNOSIS — R197 Diarrhea, unspecified: Secondary | ICD-10-CM | POA: Insufficient documentation

## 2018-08-04 DIAGNOSIS — I25110 Atherosclerotic heart disease of native coronary artery with unstable angina pectoris: Secondary | ICD-10-CM | POA: Diagnosis not present

## 2018-08-04 DIAGNOSIS — K2270 Barrett's esophagus without dysplasia: Secondary | ICD-10-CM

## 2018-08-04 DIAGNOSIS — R1032 Left lower quadrant pain: Secondary | ICD-10-CM | POA: Diagnosis not present

## 2018-08-04 HISTORY — DX: Elevation of levels of liver transaminase levels: R74.01

## 2018-08-04 HISTORY — DX: Melena: K92.1

## 2018-08-04 NOTE — Assessment & Plan Note (Signed)
Due for surveillance EGD at this time.  Patient is on Effient after drug-eluting stent placed in August 2018.  Will discuss holding Effient with cardiology prior to EGD with biopsies.  Await pending labs.  If any evidence of significant anemia, may have to hold Effient, although I have low suspicion for GI bleeding at this time.

## 2018-08-04 NOTE — Assessment & Plan Note (Signed)
Very pleasant 52 year old gentleman developed diarrhea November 2 after finding his oldest son deceased from drug overdose.  Plan is significant postprandial loose stools, some nocturnal diarrhea.  Denied any recent antibiotic use.  No ill contacts.  Developed black stools about 10 days ago and now resolved.  In retrospect I suspect this was from [place] that he was taken for the diarrhea.  Over the past 24 hours, his diarrhea is improved.  Last episode of "black stools" was 4 days ago.  He has had some left lower quadrant pain which again is improved over the past 1 to 2 days.  I doubt we are dealing with upper GI bleed.  Cannot exclude colitis or diverticulitis given improved symptoms would hold off on antibiotic therapy at this time.  We will check labs, CBC.  Screen for celiac as he does intermittent chronic diarrhea.  Advised him to stop Lomotil for now and see how he does.

## 2018-08-04 NOTE — Patient Instructions (Addendum)
1. Please have your labs done today. We will contact you with results as available.  2. I will determine if it is ok to stop Effient for upper endoscopy before we get you scheduled.  3. Avoid pepto bismol and kaopectate as these medications can cause black stools. 4. IF YOUR ABDOMINAL PAIN PERSIST OR WORSENS, I WOULD CONSIDER COURSE OF ANTIBIOTICS FOR DIVERTICULITIS BUT SINCE YOU FEEL BETTER TODAY WE WILL HOLD OFF FOR NOW.  5. If you have fever, severe abdominal pain, persistent black stools, lightheadedness please go to the ER.

## 2018-08-04 NOTE — Assessment & Plan Note (Signed)
Noted over the past several months. Patient had left office before addressing. To touch base once labs are back and discuss work up.

## 2018-08-04 NOTE — Progress Notes (Signed)
Primary Care Physician: Rosita Fire, MD  Primary Gastroenterologist:  Garfield Cornea, MD   Chief Complaint  Patient presents with  . Abdominal Pain    started ttp on left side, then the right side.   . Melena    started last tuesday but has not had any dark stools since saturday    HPI: Randy Hebert is a 52 y.o. male here for evaluation of abdominal pain and black stools. He called two days ago to make appointment. He reports symptoms started 07/03/2018 after he found his oldest son deceased due to drug overdose.   He started having frequent loose stools. Noted some LLQ discomfort. Tried Pepto and Kaopectate without relief. At some point in the past week he started having black loose stool. He's not sure if that was after Pepto/Kaopectate. Saw PCP on Monday who gave him Lomotil. Noted some RLQ tenderness after exam by PCP. Over the past two days his symptoms have been better. Stools have slowed down. Black stools resolved on Saturday. No fever. No dysuria. He believes it is all stress. No vomiting. Was having severe pp fecal urgency and nocturnal stools but this has improved as well. He has had some brbpr which he feels is hemorrhoids related. Taking Lomotil 2-3 times per day. Has a few pills left.     Patient reports taking 1 naproxen once to twice per week.  He takes aspirin and Effient.  Also on PPI.    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  States he is not well sedated and he jerked the tube out.  Had an EGD in 2018 without any difficulties.  Patient is overdue for EGD for Barrett's.  He is on aspirin and Effient, last drug-eluting stent placed in August 2018.  Current Outpatient Medications  Medication Sig Dispense Refill  . acetaminophen (TYLENOL) 325 MG tablet Take 650 mg by mouth every 6 (six) hours as needed for mild pain.    Marland Kitchen albuterol (PROVENTIL) (2.5 MG/3ML) 0.083% nebulizer solution Take 3 mLs (2.5 mg total) by  nebulization every 6 (six) hours as needed for wheezing or shortness of breath. 150 mL 1  . amLODipine (NORVASC) 10 MG tablet Take 1 tablet (10 mg total) by mouth daily. 30 tablet 6  . aspirin 81 MG EC tablet Take 1 tablet (81 mg total) by mouth daily. 30 tablet   . atorvastatin (LIPITOR) 40 MG tablet Take 1 tablet (40 mg total) by mouth daily. 30 tablet 6  . budesonide-formoterol (SYMBICORT) 160-4.5 MCG/ACT inhaler Inhale 2 puffs into the lungs 2 (two) times daily. 1 Inhaler 5  . Carboxymeth-Glycerin-Polysorb (REFRESH OPTIVE ADVANCED) 0.5-1-0.5 % SOLN Apply 1 drop to eye 2 (two) times daily as needed (dry eyes).     . cetirizine (ZYRTEC) 10 MG tablet Take 10 mg by mouth daily.    . citalopram (CELEXA) 20 MG tablet Take 20 mg by mouth daily.    . cycloSPORINE (RESTASIS) 0.05 % ophthalmic emulsion Place 1 drop into both eyes 2 (two) times daily.    . diphenoxylate-atropine (LOMOTIL) 2.5-0.025 MG tablet Take 1 tablet by mouth 3 (three) times daily.    . fluticasone (FLONASE) 50 MCG/ACT nasal spray Place 2 sprays into both nostrils daily. 16 g 6  . furosemide (LASIX) 40 MG tablet TAKE 1 TABLET BY MOUTH ONCE DAILY. 30 tablet 6  . glucose blood (IGLUCOSE TEST STRIPS) test strip 1 each by Other route 2 (two) times daily.  Use as instructed 100 each 12  . Magnesium 200 MG TABS Take 1 tablet (200 mg total) by mouth daily. 30 each 6  . metFORMIN (GLUCOPHAGE) 1000 MG tablet Take 1 tablet (1,000 mg total) by mouth 2 (two) times daily with a meal. 180 tablet 3  . naproxen (NAPROSYN) 500 MG tablet Take 500 mg by mouth as needed.    . nitroGLYCERIN (NITROSTAT) 0.4 MG SL tablet Place 1 tablet (0.4 mg total) under the tongue every 5 (five) minutes as needed for chest pain. 25 tablet 0  . pantoprazole (PROTONIX) 40 MG tablet TAKE 1 TABLET BY MOUTH ONCE A DAY. 90 tablet 3  . potassium chloride SA (K-DUR,KLOR-CON) 20 MEQ tablet TAKE 1 TABLET BY MOUTH DAILY. 30 tablet 0  . prasugrel (EFFIENT) 10 MG TABS tablet TAKE 1  TABLET BY MOUTH ONCE A DAY. 30 tablet 10  . PROVENTIL HFA 108 (90 Base) MCG/ACT inhaler INHALE 2 PUFFS INTO THE LUNGS EVERY 6 HOURS AS NEEDED FOR SHORTNESS OF BREATH OR WHEEZING. 18 g 1  . Tiotropium Bromide Monohydrate (SPIRIVA RESPIMAT) 2.5 MCG/ACT AERS Inhale 1 puff into the lungs daily. 1 Inhaler 5  . traZODone (DESYREL) 100 MG tablet Take 100 mg by mouth at bedtime.      No current facility-administered medications for this visit.     Allergies as of 08/04/2018 - Review Complete 08/04/2018  Allergen Reaction Noted  . Carvedilol Other (See Comments) 02/08/2015  . Lisinopril Anaphylaxis, Shortness Of Breath, and Swelling 05/30/2015  . Amoxicillin Nausea And Vomiting 02/06/2015   Past Medical History:  Diagnosis Date  . Allergy   . Anxiety   . Arthritis   . Asthma    hip replacement  . Back pain   . Bulging of cervical intervertebral disc   . CAD (coronary artery disease)    lateral STEMI 02/06/2015 00% D1 occlusion treated with Promus Premier 2.5 mm x 16 mm DES, 70% ramus stenosis, 40% mid RCA stenosis, 45% distal RCA stenosis, EF 45-50%  . CHF (congestive heart failure) ([place])   . COPD (chronic obstructive pulmonary disease) ([place])   . Depression   . Diabetes mellitus without complication ([place])   . 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 Dry eye   . Dyspnea   . Early satiety 09/23/2016  . GERD (gastroesophageal reflux disease)   . Headache   . Heart murmur   . Hip pain   . Hyperlipidemia   . Hypertension   . MI (myocardial infarction) ([place])   . Myocardial infarction ([place])   . Neck pain   . Otitis media   . Pleurisy   . Sinus pause    9 sec sinus pause on telemetry after started on coreg after MI, avoid AV nodal blocking agent  . Sleep apnea   . 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 Unilateral primary osteoarthritis, right hip 07/01/2016   Past Surgical History:  Procedure Laterality Date  . BIOPSY  10/09/2016   Procedure:  BIOPSY;  Surgeon: Daneil Dolin, MD;  [facility];  Service: Endoscopy;;  . CARDIAC CATHETERIZATION N/A 02/06/2015   Procedure: Left Heart Cath and Coronary Angiography;  Surgeon: Leonie Man, MD;  Location: [facility];  Service: Cardiovascular;  Laterality: N/A;  . CARDIAC CATHETERIZATION N/A 02/06/2015   Procedure: Coronary Stent Intervention;  Surgeon: Leonie Man, MD;  Location: [facility];  Service: Cardiovascular;  Laterality: N/A;  .  COLONOSCOPY WITH PROPOFOL N/A 10/09/2016   Procedure: COLONOSCOPY WITH PROPOFOL;  Surgeon: Daneil Dolin, MD;  [facility];  Service: Endoscopy;  Laterality: N/A;  12:00 pm  . COLONOSCOPY WITH PROPOFOL N/A 11/24/2016   Dr. Gala Romney: Multiple colon polyps removed, tubular adenoma.  Next colonoscopy in 5 years.  . CORONARY ANGIOPLASTY WITH STENT PLACEMENT  01/2015  . CORONARY STENT INTERVENTION N/A 04/27/2017   Procedure: CORONARY STENT INTERVENTION;  Surgeon: Nelva Bush, MD;  Location: [facility];  Service: Cardiovascular;  Laterality: N/A;  . ELECTROPHYSIOLOGY STUDY N/A 06/29/2017   Procedure: ELECTROPHYSIOLOGY STUDY;  Surgeon: Evans Lance, MD;  Location: [facility];  Service: Cardiovascular;  Laterality: N/A;  . ESOPHAGOGASTRODUODENOSCOPY (EGD) WITH PROPOFOL N/A 10/09/2016   Dr. Gala Romney: LA grade a esophagitis.  Barrett's esophagus, biopsy-proven.  Small hiatal hernia.  EGD February 2019  . INCISION / DRAINAGE HAND / FINGER    . LEFT HEART CATH AND CORONARY ANGIOGRAPHY N/A 04/27/2017   Procedure: LEFT HEART CATH AND CORONARY ANGIOGRAPHY;  Surgeon: Nelva Bush, MD;  Location: [facility];  Service: Cardiovascular;  Laterality: N/A;  . LOOP RECORDER INSERTION  06/29/2017   Procedure: Loop Recorder Insertion;  Surgeon: Evans Lance, MD;  Location: [facility];  Service: Cardiovascular;;  . POLYPECTOMY  11/24/2016   Procedure: POLYPECTOMY;  Surgeon: Daneil Dolin, MD;  [facility];  Service: Endoscopy;;  descending and sigmoid  . TOTAL HIP ARTHROPLASTY Right 07/01/2016  . TOTAL HIP ARTHROPLASTY Right 07/01/2016   Procedure: RIGHT TOTAL HIP ARTHROPLASTY ANTERIOR APPROACH;  Surgeon: Mcarthur Rossetti, MD;  Location: [place];  Service: Orthopedics;  Laterality: Right;   Family History  Problem Relation Age of Onset  . Heart attack Father   . Stroke Father   . Arthritis Father   . Heart disease Father   . Cancer Mother        ???  . Arthritis Mother   . Heart disease Brother 27       died in sleep  . Early death Brother   . Diabetes Maternal Uncle   . Alzheimer's disease Maternal Grandmother    Social History   Tobacco Use  . Smoking status: Current Every Day Smoker    Packs/day: 1.50    Years: 46.00    Pack years: 69.00    Types: Cigarettes  . Smokeless tobacco: Former Systems developer    Types: Chew  Substance Use Topics  . Alcohol use: Yes    Alcohol/week: 11.0 - 12.0 standard drinks    Types: 6 Cans of beer, 5 - 6 Shots of liquor per week    Comment: alcoholic, now drinks intermittently.  As of 08/04/2018, patient reports that he may have a drink of whiskey to 3 times per week.  He denies ever being an alcoholic  . Drug use: No    Comment: history of drug use- marijuana, cocaine- quit 2014    ROS:  General: Negative for anorexia, weight loss, fever, chills, fatigue, weakness. ENT: Negative for hoarseness, difficulty swallowing , nasal congestion. CV: Negative for chest pain, angina, palpitations, dyspnea on exertion, peripheral edema.  Respiratory: Negative for dyspnea at rest, dyspnea on exertion, cough, sputum, wheezing.  GI: See history of present illness. GU:  Negative for dysuria, hematuria, urinary incontinence, urinary frequency, nocturnal urination.  Endo: Negative for unusual weight change.    Physical Examination:   BP (!) 150/80   Pulse 93   Temp (!)  97.5 F (36.4 C) (Oral)   Ht 5\' 5"  (1.651 m)   Wt 210 lb 9.6 oz (95.5 kg)    BMI 35.05 kg/m   General: Well-nourished, well-developed in no acute distress.  Eyes: No icterus. Mouth: Oropharyngeal mucosa moist and pink , no lesions erythema or exudate. Lungs: Clear to auscultation bilaterally.  Heart: Regular rate and rhythm, no murmurs rubs or gallops.  Abdomen: Bowel sounds are normal,   nondistended, no hepatosplenomegaly or masses, no abdominal bruits or hernia , no rebound or guarding.  Mild llq tenderness Extremities: No lower extremity edema. No clubbing or deformities. Neuro: Alert and oriented x 4   Skin: Warm and dry, no jaundice.   Psych: Alert and cooperative, normal mood and affect.  Labs:  Lab Results  Component Value Date   CREATININE 0.74 06/23/2018   BUN 8 06/23/2018   NA 141 06/23/2018   K 4.8 06/23/2018   CL 105 06/23/2018   CO2 29 06/23/2018   Lab Results  Component Value Date   ALT 82 (H) 06/23/2018   AST 86 (H) 06/23/2018   ALKPHOS 83 06/23/2018   BILITOT 0.7 06/23/2018   Lab Results  Component Value Date   WBC 11.0 (H) 10/23/2017   HGB 14.2 10/23/2017   HCT 43.6 10/23/2017   MCV 86.7 10/23/2017   PLT 256 10/23/2017   No results found for: IRON, TIBC, FERRITIN Lab Results  Component Value Date   TSH 1.79 11/17/2017    Imaging Studies: Nm Myocar Multi W/spect W/wall Motion / Ef  Result Date: 07/15/2018  There was no ST segment deviation noted during stress.  Defect 1: There is a medium defect of moderate severity present in the mid anterior, mid anterolateral and apical anterior location.  Findings consistent with prior myocardial infarction.  This is an intermediate risk study.  Nuclear stress EF: 53%.

## 2018-08-05 LAB — CBC WITH DIFFERENTIAL/PLATELET
Basophils Absolute: 49 cells/uL (ref 0–200)
Basophils Relative: 0.4 %
Eosinophils Absolute: 268 cells/uL (ref 15–500)
Eosinophils Relative: 2.2 %
HCT: 41.9 % (ref 38.5–50.0)
Hemoglobin: 14.4 g/dL (ref 13.2–17.1)
LYMPHS ABS: 2074 {cells}/uL (ref 850–3900)
MCH: 29.3 pg (ref 27.0–33.0)
MCHC: 34.4 g/dL (ref 32.0–36.0)
MCV: 85.3 fL (ref 80.0–100.0)
MPV: 10.1 fL (ref 7.5–12.5)
Monocytes Relative: 6.1 %
Neutro Abs: 9065 cells/uL — ABNORMAL HIGH (ref 1500–7800)
Neutrophils Relative %: 74.3 %
PLATELETS: 307 10*3/uL (ref 140–400)
RBC: 4.91 10*6/uL (ref 4.20–5.80)
RDW: 13.1 % (ref 11.0–15.0)
TOTAL LYMPHOCYTE: 17 %
WBC: 12.2 10*3/uL — AB (ref 3.8–10.8)
WBCMIX: 744 {cells}/uL (ref 200–950)

## 2018-08-05 LAB — TISSUE TRANSGLUTAMINASE, IGA: (tTG) Ab, IgA: 1 U/mL

## 2018-08-05 LAB — IGA: Immunoglobulin A: 197 mg/dL (ref 47–310)

## 2018-08-05 NOTE — Progress Notes (Signed)
cc'd to pcp 

## 2018-08-09 ENCOUNTER — Telehealth: Payer: Self-pay | Admitting: Gastroenterology

## 2018-08-09 ENCOUNTER — Other Ambulatory Visit: Payer: Self-pay

## 2018-08-09 DIAGNOSIS — R1032 Left lower quadrant pain: Secondary | ICD-10-CM

## 2018-08-09 NOTE — Telephone Encounter (Signed)
[phone]  PATIENT SAID YOU CALLED HIM AND HE IS RETURNING CALL

## 2018-08-09 NOTE — Telephone Encounter (Signed)
See result note.  

## 2018-08-11 ENCOUNTER — Encounter: Payer: Self-pay | Admitting: Allergy & Immunology

## 2018-08-11 ENCOUNTER — Ambulatory Visit (INDEPENDENT_AMBULATORY_CARE_PROVIDER_SITE_OTHER): Payer: Medicare Other | Admitting: Allergy & Immunology

## 2018-08-11 ENCOUNTER — Telehealth: Payer: Self-pay

## 2018-08-11 VITALS — BP 136/72 | HR 82 | Resp 20

## 2018-08-11 DIAGNOSIS — J449 Chronic obstructive pulmonary disease, unspecified: Secondary | ICD-10-CM | POA: Diagnosis not present

## 2018-08-11 DIAGNOSIS — K921 Melena: Secondary | ICD-10-CM | POA: Diagnosis not present

## 2018-08-11 DIAGNOSIS — I25110 Atherosclerotic heart disease of native coronary artery with unstable angina pectoris: Secondary | ICD-10-CM

## 2018-08-11 DIAGNOSIS — R197 Diarrhea, unspecified: Secondary | ICD-10-CM | POA: Diagnosis not present

## 2018-08-11 DIAGNOSIS — R740 Nonspecific elevation of levels of transaminase and lactic acid dehydrogenase [LDH]: Secondary | ICD-10-CM | POA: Diagnosis not present

## 2018-08-11 DIAGNOSIS — K2270 Barrett's esophagus without dysplasia: Secondary | ICD-10-CM | POA: Diagnosis not present

## 2018-08-11 DIAGNOSIS — J310 Chronic rhinitis: Secondary | ICD-10-CM

## 2018-08-11 DIAGNOSIS — R1032 Left lower quadrant pain: Secondary | ICD-10-CM | POA: Diagnosis not present

## 2018-08-11 MED ORDER — CEFDINIR 300 MG PO CAPS: 300.0000 mg | ORAL_CAPSULE | Freq: Two times a day (BID) | ORAL | 0 refills | Status: AC

## 2018-08-11 NOTE — Progress Notes (Signed)
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 Frazzini presents for a follow up appointment. Since the addition of the Spiriva, his breathing seems to have done better. He is much more comfortable when talking with me in clinic today. We did not do a spirometry today since he has been having some abdominal pain (followed by GI), therefore we will follow up on this at the next visit. He does have a history of a mildly elevated eosinophil count, which gives [place] something else to add if his symptoms worsen in the future. We are planning to do skin testing at the next visit to see whether there are sensitizations whose exposure we can control and hence improve his rhinitis and breathing control.     Plan/Recommendations:   1. Asthma-COPD overlap syndrome -We did not do spirometry since you were having abdominal pain.  -However, I am glad the addition of the Spiriva has helped with your symptoms.  -We will not make medication changes at this time.  - Daily controller medication(s): Spiriva 2.55mcg one inhalation once daily and Symbicort 160/4.31mcg two puffs twice daily with spacer - Prior to physical activity: Ventolin 2 puffs 10-15 minutes before physical activity. - Rescue medications: Ventolin 4 puffs every 4-6 hours as needed - Asthma control goals:  * Full participation in all desired activities (may need albuterol before activity) * Albuterol use two time or less a week on average (not counting use with activity) * Cough interfering with sleep two time or less a month * Oral steroids no more than once a year * No hospitalizations  2. Chronic non-allergic rhinitis -Continue with fluticasone 2 sprays per nostril daily as well as cetirizine 10 mg daily. -Continue  with Astelin 2 sprays per nostril up to twice daily as needed on the worst days. -Consider using nasal saline rinses 1-2 times daily to thin out your mucus and remove allergens from her nose.  3. Left acute otitis media - Start cefdinir twice daily for seven days.  4. Return in about 3 months (around 11/10/2018) for SKIN TESTING.   Subjective:   HARL WIECHMANN is a 52 y.o. male presenting today for follow up of  Chief Complaint  Patient presents with  . Asthma    doing somewhat better. oxygen level has been getting up to 95-98.   Marland Kitchen COPD    [name] has a history of the following: Patient Active Problem List   Diagnosis Date Noted  . LLQ pain 08/04/2018  . Diarrhea 08/04/2018  . Melena 08/04/2018  . Barrett's esophagus 08/04/2018  . Transaminitis 08/04/2018  . Weight gain 06/30/2018  . Vitamin D deficiency 06/30/2018  . Alcohol abuse 06/30/2018  . DM type 2 causing vascular disease ([place]) 08/17/2017  . Syncope 06/29/2017  . HCAP (healthcare-associated pneumonia) 05/15/2017  . Uncontrolled type 2 diabetes mellitus with hyperglycemia, without long-term current use of insulin ([place]) 05/15/2017  . Acute on chronic respiratory failure with hypoxia ([place]) 05/15/2017  . Lobar pneumonia ([place]) 05/15/2017  . Chronic combined systolic and diastolic CHF (congestive heart failure) ([place]) 05/15/2017  . Sinus pause 05/15/2017  . Syncope and collapse 05/15/2017  . Bradycardia 04/28/2017  . Sleep apnea 04/28/2017  . Non-ST elevation (NSTEMI) myocardial infarction ([place]) 04/27/2017  .  NSTEMI (non-ST elevated myocardial infarction) ([place]) 04/26/2017  . Substance abuse ([place]) 04/15/2017  . Depression with anxiety 04/15/2017  . GERD (gastroesophageal reflux disease) 09/23/2016  . 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 Chronic obstructive pulmonary disease ([place]) 08/27/2015  . Cigarette nicotine dependence with nicotine-induced disorder 08/27/2015  . Hyperlipidemia  08/27/2015  . Class 2 severe obesity due to excess calories with serious comorbidity and body mass index (BMI) of 36.0 to 36.9 in adult [facility]) 08/27/2015  . Acute on chronic systolic heart failure ([place]) 06/02/2015  . ACE inhibitor-aggravated angioedema   . Chronic systolic CHF (congestive heart failure) ([place]) 06/01/2015  . CAD (coronary artery disease) 05/31/2015  . STEMI (ST elevation myocardial infarction) ([place]) 05/31/2015  . Current smoker 05/31/2015  . Essential hypertension 02/09/2015    History obtained from: chart review and patient.  Satira An[facility] Primary Care Provider is Rosita Fire, MD.     Benen is a 52 y.o. male presenting for a follow up visit.  He was last seen in mid November 2019 as a new patient.  At that time, he was having marked shortness of breath, although it is hard to tell whether this was anxiety versus an intrinsic pulmonary disorder.  He was having problems with anxiety since his son had recently passed away.  He was also having trouble getting his Dulera, so we changed him to Symbicort.  We also added on Spiriva 1 puff once daily.  We did not do skin testing because his pulmonary status was not great.  We obtain blood work instead.  We continued fluticasone 2 sprays per nostril daily as well as Zyrtec 10 mg daily.  We added on Astelin 2 sprays per nostril up to twice daily.  He did have a zone 2 that was negative to the entire panel. AEC was 268.   Since the last visit, he has been having a difficult time with abdominal pain. He was diagnosed with colitis around one year ago and it flares intermittently. This has really been a problem since his son passed from his OD in the last couple of months. He was given Lomitil to help with this diarrhea. He is followed by Dr. Gala Romney as well as Neil Crouch PA.   He did undergo a stress test that was normal, per the patient. He does wear his CPAP for his OSA. However, he has been very tired recently since his son passed  away.   Asthma/Respiratory Symptom History: He remains on the Symbicort as well as Spiriva one puff once daily. He thinks that his breathing is much better controlled. Esias's asthma has been well controlled. He has not required rescue medication, experienced nocturnal awakenings due to lower respiratory symptoms, nor have activities of daily living been limited. He has required no Emergency Department or Urgent Care visits for his asthma. He has required zero courses of systemic steroids for asthma exacerbations since the last visit. ACT score today is 14, indicating terrible asthma symptom control. This is mostly reflective of the few days following our last appointment but overall his control has increased.   Allergic Rhinitis Symptom History: He remains on the nasal sprays but he endorses continued congestion. He is using an antihistamine daily. He remains interested in allergy testing, which we will pursue at the next visit. In addition, he is also reporting left ear pain and fullness which has been ongoing for nearly one week now. He has remained afebrile.   Otherwise, there have been no  changes to his past medical history, surgical history, family history, or social history. He is able to talk about his son without choking up today. He tells me that the [facility] service went very well.     Review of Systems: a 14-point review of systems is pertinent for what is mentioned in HPI.  Otherwise, all other systems were negative.  Constitutional: negative other than that listed in the HPI Eyes: negative other than that listed in the HPI Ears, nose, mouth, throat, and face: negative other than that listed in the HPI Respiratory: negative other than that listed in the HPI Cardiovascular: negative other than that listed in the HPI Gastrointestinal: negative other than that listed in the HPI Genitourinary: negative other than that listed in the HPI Integument: negative other than that listed in the  HPI Hematologic: negative other than that listed in the HPI Musculoskeletal: negative other than that listed in the HPI Neurological: negative other than that listed in the HPI Allergy/Immunologic: negative other than that listed in the HPI    Objective:   Blood pressure 136/72, pulse 82, resp. rate 20, SpO2 98 %. There is no height or weight on file to calculate BMI.   Physical Exam:  General: Alert, interactive, in no acute distress. Pleasant male. Talkative.  Eyes: No conjunctival injection bilaterally, no discharge on the right, no discharge on the left and no Horner-Trantas dots present. PERRL bilaterally. EOMI without pain. No photophobia.  Ears: Right TM pearly gray with normal light reflex, Left TM erythematous and bulging, Right TM intact without perforation and Left TM intact without perforation.  Nose/Throat: External nose within normal limits and septum midline. Turbinates edematous and pale with clear discharge. Posterior oropharynx erythematous with cobblestoning in the posterior oropharynx. Tonsils 2+ without exudates.  Tongue without thrush. Lungs: Clear to auscultation without wheezing, rhonchi or rales. No increased work of breathing. CV: Normal S1/S2. No murmurs. Capillary refill <2 seconds.  Skin: Warm and dry, without lesions or rashes. Neuro:   Grossly intact. No focal deficits appreciated. Responsive to questions.  Diagnostic studies: none     Salvatore Marvel, MD  [facility]

## 2018-08-11 NOTE — Telephone Encounter (Signed)
Pt walked in. He just had the extra lab work done at [facility] as directed after recent lab results returned. Pt d/c the medication Dr. Legrand Rams gave him, but he did take one Bentyl this morning since he had diarrhea x 5 times this morning. Pt reported that his stomach pain lessened after his lab results were give but says the diarrhea started back. PT said he made [place] food and the diarrhea started back. He's trying to eat soup and oatmeal. He reports the his diarrhea is slimy,watery and some soreness is present in his stomach. Pt isn't sure what he should take. Pt mentioned that he lost his son to a drug overdose 07/03/18 and he said it could be related to the symptoms he's having but doesn't know how to d/c the diarrhea.

## 2018-08-11 NOTE — Patient Instructions (Addendum)
1. Asthma-COPD overlap syndrome -We did not do spirometry since you were having abdominal pain.  -However, I am glad the addition of the Spiriva has helped with your symptoms.  -We will not make medication changes at this time.  - Daily controller medication(s): Spiriva 2.68mcg one inhalation once daily and Symbicort 160/4.40mcg two puffs twice daily with spacer - Prior to physical activity: Ventolin 2 puffs 10-15 minutes before physical activity. - Rescue medications: Ventolin 4 puffs every 4-6 hours as needed - Asthma control goals:  * Full participation in all desired activities (may need albuterol before activity) * Albuterol use two time or less a week on average (not counting use with activity) * Cough interfering with sleep two time or less a month * Oral steroids no more than once a year * No hospitalizations  2. Chronic non-allergic rhinitis -Continue with fluticasone 2 sprays per nostril daily as well as cetirizine 10 mg daily. -Continue with Astelin 2 sprays per nostril up to twice daily as needed on the worst days. -Consider using nasal saline rinses 1-2 times daily to thin out your mucus and remove allergens from her nose.  3. Left acute otitis media - Start cefdinir twice daily for seven days.  4. Return in about 3 months (around 11/10/2018) for SKIN TESTING.   Please inform us of any Emergency Department visits, hospitalizations, or changes in symptoms. Call us before going to the ED for breathing or allergy symptoms since we might be able to fit you in for a sick visit. Feel free to contact us anytime with any questions, problems, or concerns.  It was a pleasure to see you again today!  Websites that have reliable patient information: 1. American Academy of Asthma, Allergy, and Immunology: www.aaaai.org 2. Food Allergy Research and Education (FARE): foodallergy.org 3. Mothers of Asthmatics: http://www.asthmacommunitynetwork.org 4. American College of Allergy, Asthma, and  Immunology: MonthlyElectricBill.co.uk   Make sure you are registered to vote! If you have moved or changed any of your contact information, you will need to get this updated before voting!

## 2018-08-12 ENCOUNTER — Telehealth: Payer: Self-pay | Admitting: Gastroenterology

## 2018-08-12 DIAGNOSIS — R945 Abnormal results of liver function studies: Principal | ICD-10-CM

## 2018-08-12 DIAGNOSIS — R7989 Other specified abnormal findings of blood chemistry: Secondary | ICD-10-CM

## 2018-08-12 LAB — HEPATITIS B SURFACE ANTIGEN: Hepatitis B Surface Ag: NONREACTIVE

## 2018-08-12 LAB — HEPATIC FUNCTION PANEL
AG RATIO: 1.7 (calc) (ref 1.0–2.5)
ALBUMIN MSPROF: 4.2 g/dL (ref 3.6–5.1)
ALKALINE PHOSPHATASE (APISO): 93 U/L (ref 40–115)
ALT: 44 U/L (ref 9–46)
AST: 43 U/L — ABNORMAL HIGH (ref 10–35)
Bilirubin, Direct: 0.2 mg/dL (ref 0.0–0.2)
Globulin: 2.5 g/dL (calc) (ref 1.9–3.7)
Indirect Bilirubin: 0.4 mg/dL (calc) (ref 0.2–1.2)
TOTAL PROTEIN: 6.7 g/dL (ref 6.1–8.1)
Total Bilirubin: 0.6 mg/dL (ref 0.2–1.2)

## 2018-08-12 LAB — IRON,TIBC AND FERRITIN PANEL
%SAT: 13 % — AB (ref 20–48)
Ferritin: 39 ng/mL (ref 38–380)
IRON: 52 ug/dL (ref 50–180)
TIBC: 416 ug/dL (ref 250–425)

## 2018-08-12 LAB — HEPATITIS C ANTIBODY
Hepatitis C Ab: NONREACTIVE
SIGNAL TO CUT-OFF: 0.02 (ref <1.00)

## 2018-08-12 LAB — TEST AUTHORIZATION

## 2018-08-12 MED ORDER — CIPROFLOXACIN HCL 500 MG PO TABS: 500.0000 mg | ORAL_TABLET | Freq: Two times a day (BID) | ORAL | 0 refills | Status: AC

## 2018-08-12 MED ORDER — METRONIDAZOLE 500 MG PO TABS: 500.0000 mg | ORAL_TABLET | Freq: Three times a day (TID) | ORAL | 0 refills | Status: AC

## 2018-08-12 NOTE — Telephone Encounter (Signed)
Yes, ok to come off effient as needed for procedure   [name] MD

## 2018-08-12 NOTE — Telephone Encounter (Signed)
Given elevated wbc, LLQ pain, persistent diarrhea, let's treat with antibiotics.   I will send in flagyl and cipro.  He can take bentyl up to four times daily for loose stool. DID SOMEONE GIVE HIM BENTYL? I DON'T HAVE IT IN OUR RECORDS. HE WAS ON LOMOTIL. LET ME KNOW IF HE WOULD LIKE TO HAVE RX FOR BENTYL. Await pending labs.  We will be scheduling egd for barrett's surveillance once I hear back from cardiology about his Effient.

## 2018-08-12 NOTE — Telephone Encounter (Signed)
Patient is going to be scheduled for upper endoscopy with esophageal biopsies in near future. If needed, can he come off Effient for 7 days?

## 2018-08-12 NOTE — Addendum Note (Signed)
Addended by: Mahala Menghini on: 08/12/2018 12:48 PM   Modules accepted: Orders

## 2018-08-17 ENCOUNTER — Other Ambulatory Visit: Payer: Self-pay | Admitting: *Deleted

## 2018-08-17 DIAGNOSIS — K2270 Barrett's esophagus without dysplasia: Secondary | ICD-10-CM

## 2018-08-17 NOTE — Telephone Encounter (Signed)
Noted. Spoke with pt to touch basis with him from yesterdays call. Pt is taking antibiotic. He will call back if he has further problems.

## 2018-08-17 NOTE — Addendum Note (Signed)
Addended by: Inge Rise on: 08/17/2018 10:18 AM   Modules accepted: Orders

## 2018-08-17 NOTE — Telephone Encounter (Signed)
Spoke with patient. He is scheduled for EGD w/ RMR W/ MAC on 09/27/18 at 7:30am. I advised will mail instructions and call back with pre-op appt. Aware he needs to hold effient 7 days prior to procedure.   His u/s is scheduled for 08/20/18 at 7:30am, arrival time 7:15am, NPO after midnight. Patient voiced understanding of appt details.

## 2018-08-17 NOTE — Telephone Encounter (Signed)
Randy Hebert, see telephone note from 08/11/18. It looks like I may have not routed it to you.   1. Please touch base with patient. If persistent LLQ pain, diarrhea, he should follow instructions there for flagyl/cipro/bentyl. 2. His Hep B/C labs were negative. AST slightly elevated, iron sat little low.  3. Schedule EGD with propofol with RMR for Barrett's surveillance. HOLD EFFIENT 7 DAYS FOR PROCEDURE.  4. Schedule abd u/s for abnormal LFTs.

## 2018-08-17 NOTE — Telephone Encounter (Signed)
LMTCB for pt to scheduled EGD/Ultrasound

## 2018-08-17 NOTE — Telephone Encounter (Addendum)
Pre-op scheduled for 09/21/18 at 1:45pm. Letter mailed. LMTCB

## 2018-08-18 NOTE — Telephone Encounter (Signed)
noted 

## 2018-08-20 ENCOUNTER — Ambulatory Visit (HOSPITAL_COMMUNITY)
Admission: RE | Admit: 2018-08-20 | Discharge: 2018-08-20 | Disposition: A | Discharge status: Home / Self Care | Payer: Medicare Other | Source: Ambulatory Visit | Attending: Gastroenterology | Admitting: Gastroenterology

## 2018-08-20 DIAGNOSIS — K760 Fatty (change of) liver, not elsewhere classified: Secondary | ICD-10-CM | POA: Insufficient documentation

## 2018-08-20 DIAGNOSIS — R7989 Other specified abnormal findings of blood chemistry: Secondary | ICD-10-CM | POA: Diagnosis not present

## 2018-08-20 DIAGNOSIS — R945 Abnormal results of liver function studies: Secondary | ICD-10-CM

## 2018-08-23 ENCOUNTER — Telehealth: Payer: Self-pay | Admitting: Gastroenterology

## 2018-08-23 ENCOUNTER — Ambulatory Visit (INDEPENDENT_AMBULATORY_CARE_PROVIDER_SITE_OTHER): Payer: Medicare Other

## 2018-08-23 DIAGNOSIS — R197 Diarrhea, unspecified: Secondary | ICD-10-CM

## 2018-08-23 DIAGNOSIS — R55 Syncope and collapse: Secondary | ICD-10-CM

## 2018-08-23 NOTE — Telephone Encounter (Signed)
Please touch base with patient. IF he is having no issues with his reflux, heartburn, upper abdominal pain or swallowing problems, THEN RMR suggests holding off for EGD for Barrett's surveillance until 09/2019.   IF he is having any of the above mentioned symptoms, then we would go forward with EGD as planned.

## 2018-08-24 LAB — CUP PACEART REMOTE DEVICE CHECK
Date Time Interrogation Session: 20191224004014
Implantable Pulse Generator Implant Date: 20181029

## 2018-08-24 NOTE — Telephone Encounter (Signed)
LMOM to call.

## 2018-08-24 NOTE — Telephone Encounter (Signed)
I spoke with the pt, he said he is not having any problems with reflux, hearburn or swallowing. He said he would like to wait until 09/2019 to have EGD.  Mindy, please cancel upcoming EGD per pt. Manuela Schwartz, please nic EGD 09/2019  Magda Paganini, pt wanted me to let you know that he is still having issues with diarrhea. He is taking the medication and it is helping some but he is still having diarrhea 4-5 times a day and it is very loose and sometimes watery and will have what the patient describes as "snot" in it. He said he is doing a soft diet for now. He is aware that our office will be closed for the holidays and we may not call back today and he said that was fine because he was busy with his family anyway.

## 2018-08-24 NOTE — Telephone Encounter (Signed)
Called endo and spoke with carolyn. Procedure cancelled

## 2018-08-26 NOTE — Progress Notes (Signed)
[facility] Summary Report / Loop Recorder 

## 2018-08-30 NOTE — Telephone Encounter (Signed)
ON RECALL  °

## 2018-08-31 NOTE — Telephone Encounter (Signed)
OK at this point we need to have him complete stool.   CDiff [facility]DH and path panel. I have placed orders.   Does he have follow up appt? If not let's make him one.

## 2018-08-31 NOTE — Addendum Note (Signed)
Addended by: Mahala Menghini on: 08/31/2018 08:02 AM   Modules accepted: Orders

## 2018-08-31 NOTE — Telephone Encounter (Signed)
Pt notified and will stop by [facility] to complete stool studies. Please schedule f/u apt per LSL.

## 2018-08-31 NOTE — Telephone Encounter (Signed)
Lmom, waiting on a return call.  

## 2018-09-02 ENCOUNTER — Encounter: Payer: Self-pay | Admitting: Gastroenterology

## 2018-09-02 NOTE — Telephone Encounter (Signed)
SCHEDULED AND LETTER SENT  °

## 2018-09-03 DIAGNOSIS — K921 Melena: Secondary | ICD-10-CM | POA: Diagnosis not present

## 2018-09-03 DIAGNOSIS — R1032 Left lower quadrant pain: Secondary | ICD-10-CM | POA: Diagnosis not present

## 2018-09-03 DIAGNOSIS — R197 Diarrhea, unspecified: Secondary | ICD-10-CM | POA: Diagnosis not present

## 2018-09-05 LAB — CUP PACEART REMOTE DEVICE CHECK
Date Time Interrogation Session: 20191121003925
Implantable Pulse Generator Implant Date: 20181029

## 2018-09-07 LAB — GASTROINTESTINAL PATHOGEN PANEL PCR
C. difficile Tox A/B, PCR: NOT DETECTED
CRYPTOSPORIDIUM, PCR: NOT DETECTED
Campylobacter, PCR: NOT DETECTED
E COLI (STEC) STX1/STX2, PCR: NOT DETECTED
E coli (ETEC) LT/ST PCR: NOT DETECTED
E coli 0157, PCR: NOT DETECTED
Giardia lamblia, PCR: NOT DETECTED
Norovirus, PCR: NOT DETECTED
Rotavirus A, PCR: NOT DETECTED
Salmonella, PCR: NOT DETECTED
Shigella, PCR: NOT DETECTED

## 2018-09-07 LAB — C. DIFFICILE GDH AND TOXIN A/B
GDH ANTIGEN: NOT DETECTED
MICRO NUMBER:: 10594
SPECIMEN QUALITY:: ADEQUATE
TOXIN A AND B: NOT DETECTED

## 2018-09-21 ENCOUNTER — Other Ambulatory Visit (HOSPITAL_COMMUNITY): Payer: Medicare Other

## 2018-09-27 ENCOUNTER — Encounter: Payer: Self-pay | Admitting: Cardiology

## 2018-09-27 ENCOUNTER — Ambulatory Visit (HOSPITAL_COMMUNITY): Admit: 2018-09-27 | Payer: Medicare Other | Admitting: Internal Medicine

## 2018-09-27 ENCOUNTER — Encounter (HOSPITAL_COMMUNITY): Payer: Self-pay

## 2018-09-27 ENCOUNTER — Ambulatory Visit (INDEPENDENT_AMBULATORY_CARE_PROVIDER_SITE_OTHER): Payer: Medicare Other

## 2018-09-27 ENCOUNTER — Ambulatory Visit (INDEPENDENT_AMBULATORY_CARE_PROVIDER_SITE_OTHER): Payer: Medicare Other | Admitting: Cardiology

## 2018-09-27 VITALS — BP 136/80 | HR 87 | Ht 65.0 in | Wt 207.0 lb

## 2018-09-27 DIAGNOSIS — I2510 Atherosclerotic heart disease of native coronary artery without angina pectoris: Secondary | ICD-10-CM | POA: Diagnosis not present

## 2018-09-27 DIAGNOSIS — R55 Syncope and collapse: Secondary | ICD-10-CM

## 2018-09-27 DIAGNOSIS — I10 Essential (primary) hypertension: Secondary | ICD-10-CM

## 2018-09-27 DIAGNOSIS — E782 Mixed hyperlipidemia: Secondary | ICD-10-CM

## 2018-09-27 SURGERY — ESOPHAGOGASTRODUODENOSCOPY (EGD) WITH PROPOFOL
Anesthesia: Monitor Anesthesia Care

## 2018-09-27 NOTE — Patient Instructions (Signed)
Medication Instructions:  Stop effient   Labwork: none  Testing/Procedures: none  Follow-Up: Your physician wants you to follow-up in: 4 months.  You will receive a reminder letter in the mail two months in advance. If you don't receive a letter, please call our office to schedule the follow-up appointment.   Any Other Special Instructions Will Be Listed Below (If Applicable).     If you need a refill on your cardiac medications before your next appointment, please call your pharmacy.

## 2018-09-27 NOTE — Progress Notes (Signed)
Clinical Summary [name] is a 53 y.o.male seen today for follow up of the following medical problems.    1. CAD - admit 01/2015 with lateral STEMI, received DES to D1. There was a 70% ramus and 40% mid RCA that was medically managed - 01/2015 echo LVEF 40%, lateral wall hypokinesis - no beta blocker due to history of sinus pause and bradycardia. No ACE-I given history of severe angioedema, no ARB due to risk of cross reactivity - admit 12/2015 with atypical chest pain, worst with palpatoin and movement. Had done some heavy lifting the day it started, negative workup for ACS. Since that time no recurrent symptoms   04/2017 PCI with stent.   - last visit was having some episodes of chest pain. Started around the time of the unexpected death of his son from an overdose 07/2018 nuclear stress prior infarct, no current ischemia - no recurrent symptoms. Recent GI workup was benign as well    2. SOB/COPD - 07/2015 PFTs: mild to mod ventilatory defect with small airway obstruction.  - 07/2015 CT PE no PE, though suboptimal study - 07/2015 CXR no acute process.  PFTs with mild to mod vent defect, ABGs showed borderline resting hypoxemia.   - mild cough congestion recently.   3. Hyperlipidemia - he reports anxiety on crestor, he stopped taking.  - endocrine lowered atorva from 80 to 40mg  due to elevated LFTs. - 01/2018 TC 99 HDL 25 TG 215 LDL 46  - remains compliant with meds.    4. Syncope - nocturnal bradycardia noted on previous monitors, no significant daytime episodes - has loop recorder, followed by EP - no beta blockers due to sinus pauses.   - denies any recent episodes.    5. HTN -he is compliant with meds  6. OSA - comliant with  CPAP machine.      SH: recently his son overdosed, passed away Past Medical History:  Diagnosis Date  . Allergy   . Anxiety   . Arthritis   . Asthma    hip replacement  . Back pain   . Bulging of  cervical intervertebral disc   . CAD (coronary artery disease)    lateral STEMI 02/06/2015 00% D1 occlusion treated with Promus Premier 2.5 mm x 16 mm DES, 70% ramus stenosis, 40% mid RCA stenosis, 45% distal RCA stenosis, EF 45-50%  . CHF (congestive heart failure) ([place])   . COPD (chronic obstructive pulmonary disease) ([place])   . Depression   . Diabetes mellitus without complication ([place])   . 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 Dry eye   . Dyspnea   . Early satiety 09/23/2016  . GERD (gastroesophageal reflux disease)   . Headache   . Heart murmur   . Hip pain   . Hyperlipidemia   . Hypertension   . MI (myocardial infarction) ([place])   . Myocardial infarction ([place])   . Neck pain   . Otitis media   . Pleurisy   . Sinus pause    9 sec sinus pause on telemetry after started on coreg after MI, avoid AV nodal blocking agent  . Sleep apnea   . 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 Unilateral primary osteoarthritis, right hip 07/01/2016     Allergies  Allergen Reactions  . Carvedilol Other (See Comments)    Sinus pause on telemetry >3 seconds. Longest one 9 sec. No AV nodal agent  .  Lisinopril Anaphylaxis, Shortness Of Breath and Swelling    Angioedema, required intubation and mechanical ventilation  . Amoxicillin Nausea And Vomiting    Nausea only - no allergy     Current Outpatient Medications  Medication Sig Dispense Refill  . acetaminophen (TYLENOL) 325 MG tablet Take 650 mg by mouth every 6 (six) hours as needed for mild pain.    Marland Kitchen albuterol (PROVENTIL) (2.5 MG/3ML) 0.083% nebulizer solution Take 3 mLs (2.5 mg total) by nebulization every 6 (six) hours as needed for wheezing or shortness of breath. 150 mL 1  . amLODipine (NORVASC) 10 MG tablet Take 1 tablet (10 mg total) by mouth daily. 30 tablet 6  . aspirin 81 MG EC tablet Take 1 tablet (81 mg total) by mouth daily. 30 tablet   . atorvastatin (LIPITOR) 40 MG tablet Take 1 tablet  (40 mg total) by mouth daily. 30 tablet 6  . budesonide-formoterol (SYMBICORT) 160-4.5 MCG/ACT inhaler Inhale 2 puffs into the lungs 2 (two) times daily. 1 Inhaler 5  . Carboxymeth-Glycerin-Polysorb (REFRESH OPTIVE ADVANCED) 0.5-1-0.5 % SOLN Apply 1 drop to eye 2 (two) times daily as needed (dry eyes).     . cetirizine (ZYRTEC) 10 MG tablet Take 10 mg by mouth daily.    . citalopram (CELEXA) 20 MG tablet Take 20 mg by mouth daily.    . cycloSPORINE (RESTASIS) 0.05 % ophthalmic emulsion Place 1 drop into both eyes 2 (two) times daily.    . fluticasone (FLONASE) 50 MCG/ACT nasal spray Place 2 sprays into both nostrils daily. 16 g 6  . furosemide (LASIX) 40 MG tablet TAKE 1 TABLET BY MOUTH ONCE DAILY. 30 tablet 6  . glucose blood (IGLUCOSE TEST STRIPS) test strip 1 each by Other route 2 (two) times daily. Use as instructed 100 each 12  . Magnesium 200 MG TABS Take 1 tablet (200 mg total) by mouth daily. 30 each 6  . metFORMIN (GLUCOPHAGE) 1000 MG tablet Take 1 tablet (1,000 mg total) by mouth 2 (two) times daily with a meal. 180 tablet 3  . naproxen (NAPROSYN) 500 MG tablet Take 500 mg by mouth as needed.    . nitroGLYCERIN (NITROSTAT) 0.4 MG SL tablet Place 1 tablet (0.4 mg total) under the tongue every 5 (five) minutes as needed for chest pain. 25 tablet 0  . pantoprazole (PROTONIX) 40 MG tablet TAKE 1 TABLET BY MOUTH ONCE A DAY. 90 tablet 3  . potassium chloride SA (K-DUR,KLOR-CON) 20 MEQ tablet TAKE 1 TABLET BY MOUTH DAILY. 30 tablet 0  . prasugrel (EFFIENT) 10 MG TABS tablet TAKE 1 TABLET BY MOUTH ONCE A DAY. 30 tablet 10  . PROVENTIL HFA 108 (90 Base) MCG/ACT inhaler INHALE 2 PUFFS INTO THE LUNGS EVERY 6 HOURS AS NEEDED FOR SHORTNESS OF BREATH OR WHEEZING. 18 g 1  . Tiotropium Bromide Monohydrate (SPIRIVA RESPIMAT) 2.5 MCG/ACT AERS Inhale 1 puff into the lungs daily. 1 Inhaler 5  . traZODone (DESYREL) 100 MG tablet Take 100 mg by mouth at bedtime.      No current facility-administered  medications for this visit.      Past Surgical History:  Procedure Laterality Date  . BIOPSY  10/09/2016   Procedure: BIOPSY;  Surgeon: Daneil Dolin, MD;  [facility];  Service: Endoscopy;;  . CARDIAC CATHETERIZATION N/A 02/06/2015   Procedure: Left Heart Cath and Coronary Angiography;  Surgeon: Leonie Man, MD;  Location: [place];  Service: Cardiovascular;  Laterality: N/A;  . CARDIAC CATHETERIZATION  N/A 02/06/2015   Procedure: Coronary Stent Intervention;  Surgeon: Leonie Man, MD;  Location: [place] [facility];  Service: Cardiovascular;  Laterality: N/A;  . COLONOSCOPY WITH PROPOFOL N/A 10/09/2016   Procedure: COLONOSCOPY WITH PROPOFOL;  Surgeon: Daneil Dolin, MD;  [facility];  Service: Endoscopy;  Laterality: N/A;  12:00 pm  . COLONOSCOPY WITH PROPOFOL N/A 11/24/2016   Dr. Gala Romney: Multiple colon polyps removed, tubular adenoma.  Next colonoscopy in 5 years.  . CORONARY ANGIOPLASTY WITH STENT PLACEMENT  01/2015  . CORONARY STENT INTERVENTION N/A 04/27/2017   Procedure: CORONARY STENT INTERVENTION;  Surgeon: Nelva Bush, MD;  Location: [facility] [facility];  Service: Cardiovascular;  Laterality: N/A;  . ELECTROPHYSIOLOGY STUDY N/A 06/29/2017   Procedure: ELECTROPHYSIOLOGY STUDY;  Surgeon: Evans Lance, MD;  Location: [facility];  Service: Cardiovascular;  Laterality: N/A;  . ESOPHAGOGASTRODUODENOSCOPY (EGD) WITH PROPOFOL N/A 10/09/2016   Dr. Gala Romney: LA grade a esophagitis.  Barrett's esophagus, biopsy-proven.  Small hiatal hernia.  EGD February 2019  . INCISION / DRAINAGE HAND / FINGER    . LEFT HEART CATH AND CORONARY ANGIOGRAPHY N/A 04/27/2017   Procedure: LEFT HEART CATH AND CORONARY ANGIOGRAPHY;  Surgeon: Nelva Bush, MD;  Location: [place] [facility];  Service: Cardiovascular;  Laterality: N/A;  . LOOP RECORDER INSERTION  06/29/2017   Procedure: Loop Recorder Insertion;  Surgeon: Evans Lance, MD;  Location: [place];   Service: Cardiovascular;;  . POLYPECTOMY  11/24/2016   Procedure: POLYPECTOMY;  Surgeon: Daneil Dolin, MD;  [facility];  Service: Endoscopy;;  descending and sigmoid  . TOTAL HIP ARTHROPLASTY Right 07/01/2016  . TOTAL HIP ARTHROPLASTY Right 07/01/2016   Procedure: RIGHT TOTAL HIP ARTHROPLASTY ANTERIOR APPROACH;  Surgeon: Mcarthur Rossetti, MD;  Location: [place];  Service: Orthopedics;  Laterality: Right;     Allergies  Allergen Reactions  . Carvedilol Other (See Comments)    Sinus pause on telemetry >3 seconds. Longest one 9 sec. No AV nodal agent  . Lisinopril Anaphylaxis, Shortness Of Breath and Swelling    Angioedema, required intubation and mechanical ventilation  . Amoxicillin Nausea And Vomiting    Nausea only - no allergy      Family History  Problem Relation Age of Onset  . Heart attack Father   . Stroke Father   . Arthritis Father   . Heart disease Father   . Cancer Mother        ???  . Arthritis Mother   . Heart disease Brother 20       died in sleep  . Early death Brother   . Diabetes Maternal Uncle   . Alzheimer's disease Maternal Grandmother      Social History Mr. Skelley reports that he has been smoking cigarettes. He has a 69.00 pack-year smoking history. He has quit using smokeless tobacco.  His smokeless tobacco use included chew. Mr. Tetzloff reports current alcohol use of about 11.0 - 12.0 standard drinks of alcohol per week.   Review of Systems CONSTITUTIONAL: No weight loss, fever, chills, weakness or fatigue.  HEENT: Eyes: No visual loss, blurred vision, double vision or yellow sclerae.No hearing loss, sneezing, congestion, runny nose or sore throat.  SKIN: No rash or itching.  CARDIOVASCULAR: per hpi RESPIRATORY: No shortness of breath, cough or sputum.  GASTROINTESTINAL: No anorexia, nausea, vomiting or diarrhea. No abdominal pain or blood.  GENITOURINARY: No burning on urination, no polyuria NEUROLOGICAL: No headache,  dizziness,  syncope, paralysis, ataxia, numbness or tingling in the extremities. No change in bowel or bladder control.  MUSCULOSKELETAL: No muscle, back pain, joint pain or stiffness.  LYMPHATICS: No enlarged nodes. No history of splenectomy.  PSYCHIATRIC: No history of depression or anxiety.  ENDOCRINOLOGIC: No reports of sweating, cold or heat intolerance. No polyuria or polydipsia.  Marland Kitchen   Physical Examination Vitals:   09/27/18 0904  BP: 136/80  Pulse: 87  SpO2: 96%   Vitals:   09/27/18 0904  Weight: 207 lb (93.9 kg)  Height: 5\' 5"  (1.651 m)    Gen: resting comfortably, no acute distress HEENT: no scleral icterus, pupils equal round and reactive, no palptable cervical adenopathy,  CV: RRR, no m/r/g, no jvd Resp: Clear to auscultation bilaterally GI: abdomen is soft, non-tender, non-distended, normal bowel sounds, no hepatosplenomegaly MSK: extremities are warm, no edema.  Skin: warm, no rash Neuro:  no focal deficits Psych: appropriate affect   Diagnostic Studies 01/2015 cath 1. 1st Diag lesion, 100% stenosed. A Promus Premier 2.5 mm x 16 mm drug-eluting stent was placed. There is a 0% residual stenosis post intervention. 2. Ramus lesion, 70% stenosed. 3. Mid RCA lesion, 40% stenosed. Dist RCA lesion, 45% stenosed. 4. Mild to moderately reduced LVEF with anterolateral and apical hypokinesis and elevated LVEDP  Abnormal anatomy with equal sized Diagonal and LAD  but extensive RCA. Successful PCI of the First Diagonal .  Recommendations:  Standard post radial cath/PCI TR band removal  Dual independent therapy for minimum one year.  Check 2-D echocardiogram to better assess EF  Add statin and beta blocker. Smoking cessation counseling. Cardiac Rehabilitation And Case Management Consultation  If hemodynamic stable, would consider fast-track discharge  Would consider noninvasive evaluation of the Ramus Intermedius lesion in the absence of any recurrent  anginal pain.  01/2015 echo Study Conclusions  - Left ventricle: Moderately severe hypokinesis of mid anterolateral segment, apical lateral segment, mid/apical anterior segments. EF is 40%. The cavity size was normal. Wall thickness was increased in a pattern of mild LVH. - Aortic valve: Sclerosis without stenosis. There was no significant regurgitation. - Right ventricle: The cavity size was normal. Systolic function was normal.  07/2015 CT PE IMPRESSION: 1. Suboptimal contrast bolus timing in the pulmonary arterial tree, poor in the lobar vessels other than the right lower lobe. No central pulmonary embolus. If there is continued suspicion of acute PE nuclear medicine V/Q scan may be most helpful. 2. Situs abnormality in the chest with right side aortic arch and descending aorta. Mirror image ing. Other visible situs including cardiac and upper abdominal situs appear normal. 3. Calcified Coronary artery atherosclerosis. Hepatomegaly with fatty liver disease.  07/2015 PFTs: mild to mod ventilatory defect with small airway obstruction, moderately reduced DLCO.   07/2018 nuclear stress  There was no ST segment deviation noted during stress.  Defect 1: There is a medium defect of moderate severity present in the mid anterior, mid anterolateral and apical anterior location.  Findings consistent with prior myocardial infarction.  This is an intermediate risk study.  Nuclear stress EF: 53%.   Assessment and Plan  1. CAD -recent nuclear stress without significant ischemia. Prior symptoms have resolved. Seemed to be triggered around the time he found the body of his son who overdosed and extreme stress - over 1 year since last stent, we will d/c effient.    2. Hyperlipideima - he will continue statin.    3. HTN -at goal, continue current meds   Randy Hebert.  [name], M.D.

## 2018-09-28 LAB — CUP PACEART REMOTE DEVICE CHECK
Date Time Interrogation Session: 20200126010937
Implantable Pulse Generator Implant Date: 20181029

## 2018-09-28 NOTE — Progress Notes (Signed)
[facility] Summary Report / Loop Recorder 

## 2018-10-02 DIAGNOSIS — G4733 Obstructive sleep apnea (adult) (pediatric): Secondary | ICD-10-CM | POA: Diagnosis not present

## 2018-10-03 ENCOUNTER — Other Ambulatory Visit: Payer: Self-pay

## 2018-10-03 ENCOUNTER — Emergency Department (HOSPITAL_COMMUNITY)
Admission: EM | Admit: 2018-10-03 | Discharge: 2018-10-03 | Disposition: A | Discharge status: Home / Self Care | Payer: Medicare Other | Attending: Emergency Medicine | Admitting: Emergency Medicine

## 2018-10-03 ENCOUNTER — Encounter (HOSPITAL_COMMUNITY): Payer: Self-pay

## 2018-10-03 ENCOUNTER — Emergency Department (HOSPITAL_COMMUNITY): Payer: Medicare Other

## 2018-10-03 DIAGNOSIS — I2510 Atherosclerotic heart disease of native coronary artery without angina pectoris: Secondary | ICD-10-CM | POA: Insufficient documentation

## 2018-10-03 DIAGNOSIS — I5042 Chronic combined systolic (congestive) and diastolic (congestive) heart failure: Secondary | ICD-10-CM | POA: Diagnosis not present

## 2018-10-03 DIAGNOSIS — I110 Hypertensive heart disease with heart failure: Secondary | ICD-10-CM | POA: Insufficient documentation

## 2018-10-03 DIAGNOSIS — E119 Type 2 diabetes mellitus without complications: Secondary | ICD-10-CM | POA: Insufficient documentation

## 2018-10-03 DIAGNOSIS — I252 Old myocardial infarction: Secondary | ICD-10-CM | POA: Diagnosis not present

## 2018-10-03 DIAGNOSIS — Z79899 Other long term (current) drug therapy: Secondary | ICD-10-CM | POA: Diagnosis not present

## 2018-10-03 DIAGNOSIS — Z96641 Presence of right artificial hip joint: Secondary | ICD-10-CM | POA: Diagnosis not present

## 2018-10-03 DIAGNOSIS — Z7982 Long term (current) use of aspirin: Secondary | ICD-10-CM | POA: Diagnosis not present

## 2018-10-03 DIAGNOSIS — J209 Acute bronchitis, unspecified: Secondary | ICD-10-CM | POA: Diagnosis not present

## 2018-10-03 DIAGNOSIS — F17210 Nicotine dependence, cigarettes, uncomplicated: Secondary | ICD-10-CM | POA: Diagnosis not present

## 2018-10-03 DIAGNOSIS — Z7984 Long term (current) use of oral hypoglycemic drugs: Secondary | ICD-10-CM | POA: Diagnosis not present

## 2018-10-03 DIAGNOSIS — R05 Cough: Secondary | ICD-10-CM | POA: Diagnosis not present

## 2018-10-03 MED ORDER — DOXYCYCLINE HYCLATE 100 MG PO CAPS: 100.0000 mg | ORAL_CAPSULE | Freq: Two times a day (BID) | ORAL | 0 refills | Status: DC

## 2018-10-03 NOTE — ED Triage Notes (Signed)
Pt c/o cough and cold like symptoms for 10 days.

## 2018-10-03 NOTE — Discharge Instructions (Signed)
Return if any problems.  See your Physician for recheck in 2-3 days °

## 2018-10-03 NOTE — ED Provider Notes (Signed)
[facility] EMERGENCY DEPARTMENT Provider Note   CSN: [id number] Arrival date & time: 10/03/18  4098     History   Chief Complaint Chief Complaint  Patient presents with  . Cough    HPI Randy Hebert is a 53 y.o. male.  The history is provided by the patient. No language interpreter was used.  Cough  Cough characteristics:  Productive Sputum characteristics:  Nondescript Severity:  Moderate Onset quality:  Gradual Duration:  10 days Timing:  Constant Chronicity:  New Smoker: yes   Context: upper respiratory infection   Relieved by:  Nothing Worsened by:  Nothing Ineffective treatments:  None tried Associated symptoms: shortness of breath   Associated symptoms: no headaches   Pt is a smoker.  Pt is on home nebulizer   Past Medical History:  Diagnosis Date  . Allergy   . Anxiety   . Arthritis   . Asthma    hip replacement  . Back pain   . Bulging of cervical intervertebral disc   . CAD (coronary artery disease)    lateral STEMI 02/06/2015 00% D1 occlusion treated with Promus Premier 2.5 mm x 16 mm DES, 70% ramus stenosis, 40% mid RCA stenosis, 45% distal RCA stenosis, EF 45-50%  . CHF (congestive heart failure) ([place])   . COPD (chronic obstructive pulmonary disease) ([place])   . Depression   . Diabetes mellitus without complication ([place])   . 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 Dry eye   . Dyspnea   . Early satiety 09/23/2016  . GERD (gastroesophageal reflux disease)   . Headache   . Heart murmur   . Hip pain   . Hyperlipidemia   . Hypertension   . MI (myocardial infarction) ([place])   . Myocardial infarction ([place])   . Neck pain   . Otitis media   . Pleurisy   . Sinus pause    9 sec sinus pause on telemetry after started on coreg after MI, avoid AV nodal blocking agent  . Sleep apnea   . Substance abuse ([place]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  . Unilateral primary osteoarthritis, right hip 07/01/2016    Patient Active Problem List    Diagnosis Date Noted  . LLQ pain 08/04/2018  . Diarrhea 08/04/2018  . Melena 08/04/2018  . Barrett's esophagus 08/04/2018  . Transaminitis 08/04/2018  . Weight gain 06/30/2018  . Vitamin D deficiency 06/30/2018  . Alcohol abuse 06/30/2018  . DM type 2 causing vascular disease ([place]) 08/17/2017  . Syncope 06/29/2017  . HCAP (healthcare-associated pneumonia) 05/15/2017  . Uncontrolled type 2 diabetes mellitus with hyperglycemia, without long-term current use of insulin ([place]) 05/15/2017  . Acute on chronic respiratory failure with hypoxia ([place]) 05/15/2017  . Lobar pneumonia ([place]) 05/15/2017  . Chronic combined systolic and diastolic CHF (congestive heart failure) ([place]) 05/15/2017  . Sinus pause 05/15/2017  . Syncope and collapse 05/15/2017  . Bradycardia 04/28/2017  . Sleep apnea 04/28/2017  . Non-ST elevation (NSTEMI) myocardial infarction ([place]) 04/27/2017  . NSTEMI (non-ST elevated myocardial infarction) ([place]) 04/26/2017  . Substance abuse ([place]) 04/15/2017  . Depression with anxiety 04/15/2017  . GERD (gastroesophageal reflux disease) 09/23/2016  . 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 Chronic obstructive pulmonary disease ([place]) 08/27/2015  . Cigarette nicotine dependence with nicotine-induced disorder 08/27/2015  . Hyperlipidemia 08/27/2015  . Class 2 severe obesity due to excess calories with serious comorbidity and body mass index (BMI) of 36.0 to  36.9 in adult [facility]) 08/27/2015  . Acute on chronic systolic heart failure ([place]) 06/02/2015  . ACE inhibitor-aggravated angioedema   . Chronic systolic CHF (congestive heart failure) ([place]) 06/01/2015  . CAD (coronary artery disease) 05/31/2015  . STEMI (ST elevation myocardial infarction) ([place]) 05/31/2015  . Current smoker 05/31/2015  . Essential hypertension 02/09/2015    Past Surgical History:  Procedure Laterality Date  . BIOPSY  10/09/2016   Procedure: BIOPSY;  Surgeon: Daneil Dolin, MD;  Location: [facility] END[facility];  Service: Endoscopy;;  . CARDIAC CATHETERIZATION N/A 02/06/2015   Procedure: Left Heart Cath and Coronary Angiography;  Surgeon: Leonie Man, MD;  Location: [place] CV LAB;  Service: Cardiovascular;  Laterality: N/A;  . CARDIAC CATHETERIZATION N/A 02/06/2015   Procedure: Coronary Stent Intervention;  Surgeon: Leonie Man, MD;  Location: [place] CV LAB;  Service: Cardiovascular;  Laterality: N/A;  . COLONOSCOPY WITH PROPOFOL N/A 10/09/2016   Procedure: COLONOSCOPY WITH PROPOFOL;  Surgeon: Daneil Dolin, MD;  Location: [facility];  Service: Endoscopy;  Laterality: N/A;  12:00 pm  . COLONOSCOPY WITH PROPOFOL N/A 11/24/2016   Dr. Gala Romney: Multiple colon polyps removed, tubular adenoma.  Next colonoscopy in 5 years.  . CORONARY ANGIOPLASTY WITH STENT PLACEMENT  01/2015  . CORONARY STENT INTERVENTION N/A 04/27/2017   Procedure: CORONARY STENT INTERVENTION;  Surgeon: Nelva Bush, MD;  Location: [place] CV LAB;  Service: Cardiovascular;  Laterality: N/A;  . ELECTROPHYSIOLOGY STUDY N/A 06/29/2017   Procedure: ELECTROPHYSIOLOGY STUDY;  Surgeon: Evans Lance, MD;  Location: [place] CV LAB;  Service: Cardiovascular;  Laterality: N/A;  . ESOPHAGOGASTRODUODENOSCOPY (EGD) WITH PROPOFOL N/A 10/09/2016   Dr. Gala Romney: LA grade a esophagitis.  Barrett's esophagus, biopsy-proven.  Small hiatal hernia.  EGD February 2019  . INCISION / DRAINAGE HAND / FINGER    . LEFT HEART CATH AND CORONARY ANGIOGRAPHY N/A 04/27/2017   Procedure: LEFT HEART CATH AND CORONARY ANGIOGRAPHY;  Surgeon: Nelva Bush, MD;  Location: [place] CV LAB;  Service: Cardiovascular;  Laterality: N/A;  . LOOP RECORDER INSERTION  06/29/2017   Procedure: Loop Recorder Insertion;  Surgeon: Evans Lance, MD;  Location: [place] CV LAB;  Service: Cardiovascular;;  . POLYPECTOMY  11/24/2016   Procedure: POLYPECTOMY;  Surgeon: Daneil Dolin, MD;  [facility];  Service: Endoscopy;;  descending and  sigmoid  . TOTAL HIP ARTHROPLASTY Right 07/01/2016  . TOTAL HIP ARTHROPLASTY Right 07/01/2016   Procedure: RIGHT TOTAL HIP ARTHROPLASTY ANTERIOR APPROACH;  Surgeon: Mcarthur Rossetti, MD;  Location: [place];  Service: Orthopedics;  Laterality: Right;        Home Medications    Prior to Admission medications   Medication Sig Start Date End Date Taking? Authorizing Provider  acetaminophen (TYLENOL) 325 MG tablet Take 650 mg by mouth every 6 (six) hours as needed for mild pain.    [provider]  albuterol (PROVENTIL) (2.5 MG/3ML) 0.083% nebulizer solution Take 3 mLs (2.5 mg total) by nebulization every 6 (six) hours as needed for wheezing or shortness of breath. 05/27/16   Soyla Dryer, PA-C  amLODipine (NORVASC) 10 MG tablet Take 1 tablet (10 mg total) by mouth daily. 06/30/18   Cassandria Anger, MD  aspirin 81 MG EC tablet Take 1 tablet (81 mg total) by mouth daily. 04/29/17   Cheryln Manly, NP  atorvastatin (LIPITOR) 40 MG tablet Take 1 tablet (40 mg total) by mouth daily. 02/25/18   Cassandria Anger, MD  budesonide-formoterol (SYMBICORT) 160-4.5 MCG/ACT inhaler Inhale 2 puffs into the lungs 2 (two) times daily. 07/14/18   Valentina Shaggy, MD  Carboxymeth-Glycerin-Polysorb (REFRESH OPTIVE ADVANCED) 0.5-1-0.5 % SOLN Apply 1 drop to eye 2 (two) times daily as needed (dry eyes).     [provider]  cetirizine (ZYRTEC) 10 MG tablet Take 10 mg by mouth daily.    [provider]  citalopram (CELEXA) 20 MG tablet Take 20 mg by mouth daily. 11/06/17   [provider]  cycloSPORINE (RESTASIS) 0.05 % ophthalmic emulsion Place 1 drop into both eyes 2 (two) times daily.    [provider]  ferrous sulfate 325 (65 FE) MG tablet Take 325 mg by mouth daily with breakfast.    [provider]  fluticasone (FLONASE) 50 MCG/ACT nasal spray Place 2 sprays into both nostrils daily. 09/18/17   Raylene Everts, MD  furosemide (LASIX)  40 MG tablet TAKE 1 TABLET BY MOUTH ONCE DAILY. 05/04/18   Evans Lance, MD  glucose blood (IGLUCOSE TEST STRIPS) test strip 1 each by Other route 2 (two) times daily. Use as instructed 07/31/17   Raylene Everts, MD  Magnesium 200 MG TABS Take 1 tablet (200 mg total) by mouth daily. 03/06/17   Lendon Colonel, NP  metFORMIN (GLUCOPHAGE) 1000 MG tablet Take 1 tablet (1,000 mg total) by mouth 2 (two) times daily with a meal. 04/15/17   Raylene Everts, MD  naproxen (NAPROSYN) 500 MG tablet Take 500 mg by mouth as needed.    [provider]  nitroGLYCERIN (NITROSTAT) 0.4 MG SL tablet Place 1 tablet (0.4 mg total) under the tongue every 5 (five) minutes as needed for chest pain. 10/21/17   Raylene Everts, MD  pantoprazole (PROTONIX) 40 MG tablet TAKE 1 TABLET BY MOUTH ONCE A DAY. 05/04/18   Carlis Stable, NP  potassium chloride SA (K-DUR,KLOR-CON) 20 MEQ tablet TAKE 1 TABLET BY MOUTH DAILY. 12/17/17   Raylene Everts, MD  PROVENTIL HFA 108 [phone] Base) MCG/ACT inhaler INHALE 2 PUFFS INTO THE LUNGS EVERY 6 HOURS AS NEEDED FOR SHORTNESS OF BREATH OR WHEEZING. 08/13/17   Raylene Everts, MD  Tiotropium Bromide Monohydrate (SPIRIVA RESPIMAT) 2.5 MCG/ACT AERS Inhale 1 puff into the lungs daily. 07/14/18 08/13/18  Valentina Shaggy, MD  traZODone (DESYREL) 100 MG tablet Take 100 mg by mouth at bedtime.     [provider]    Family History Family History  Problem Relation Age of Onset  . Heart attack Father   . Stroke Father   . Arthritis Father   . Heart disease Father   . Cancer Mother        ???  . Arthritis Mother   . Heart disease Brother 70       died in sleep  . Early death Brother   . Diabetes Maternal Uncle   . Alzheimer's disease Maternal Grandmother     Social History Social History   Tobacco Use  . Smoking status: Current Every Day Smoker    Packs/day: 1.50    Years: 46.00    Pack years: 69.00    Types: Cigarettes  . Smokeless tobacco: Former  Systems developer    Types: Chew  Substance Use Topics  . Alcohol use: Yes    Alcohol/week: 11.0 - 12.0 standard drinks    Types: 6 Cans of beer, 5 - 6 Shots of liquor per week    Comment: alcoholic, now drinks intermittently.  As of  08/04/2018, patient reports that he may have a drink of whiskey to 3 times per week.  He denies ever being an alcoholic  . Drug use: No    Comment: history of drug use- marijuana, cocaine- quit 2014     Allergies   Carvedilol; Lisinopril; and Amoxicillin   Review of Systems Review of Systems  Respiratory: Positive for cough and shortness of breath.   Neurological: Negative for headaches.  All other systems reviewed and are negative.    Physical Exam Updated Vital Signs BP (!) 149/88 (BP Location: Right Arm)   Pulse 84   Temp 98.3 F (36.8 C) (Oral)   Resp 18   Ht 5\' 5"  (1.651 m)   Wt 93 kg   SpO2 98%   BMI 34.11 kg/m   Physical Exam Vitals signs and nursing note reviewed.  Constitutional:      Appearance: He is well-developed.  HENT:     Head: Normocephalic.     Right Ear: Tympanic membrane normal.     Left Ear: Tympanic membrane normal.     Mouth/Throat:     Mouth: Mucous membranes are moist.  Eyes:     Pupils: Pupils are equal, round, and reactive to light.  Neck:     Musculoskeletal: Normal range of motion.  Cardiovascular:     Rate and Rhythm: Normal rate.  Pulmonary:     Effort: Pulmonary effort is normal.  Abdominal:     General: There is no distension.  Musculoskeletal: Normal range of motion.  Skin:    General: Skin is warm.  Neurological:     General: No focal deficit present.     Mental Status: He is alert and oriented to person, place, and time.  Psychiatric:        Mood and Affect: Mood normal.      ED Treatments / Results  Labs (all labs ordered are listed, but only abnormal results are displayed) Labs Reviewed - No data to display  EKG None  Radiology Dg Chest 2 View  Result Date: 10/03/2018 CLINICAL DATA:   Cough.  History of COPD and mild cardial infarction. EXAM: CHEST - 2 VIEW COMPARISON:  03/05/2018 FINDINGS: Cardiac silhouette is normal in size. There is a right aortic arch. No mediastinal hilar masses. No evidence of adenopathy. There are prominent bronchovascular markings bilaterally stable from prior exams. No evidence of pneumonia or pulmonary edema. No pleural effusion or pneumothorax. Skeletal structures are intact. IMPRESSION: No acute cardiopulmonary disease. Electronically Signed   By: Lajean Manes M.D.   On: 10/03/2018 11:24    Procedures Procedures (including critical care time)  Medications Ordered in ED Medications - No data to display   Initial Impression / Assessment and Plan / ED Course  I have reviewed the triage vital signs and the nursing notes.  Pertinent labs & imaging results that were available during my care of the patient were reviewed by me and considered in my medical decision making (see chart for details).     MDM   Chest xray no pneumonia   Final Clinical Impressions(s) / ED Diagnoses   Final diagnoses:  Acute bronchitis, unspecified organism    ED Discharge Orders    None    An After Visit Summary was printed and given to the patient.    Sidney Ace 10/03/18 1203    Noemi Chapel, MD 10/05/18 [phone]

## 2018-10-05 ENCOUNTER — Other Ambulatory Visit: Payer: Self-pay | Admitting: Internal Medicine

## 2018-10-06 ENCOUNTER — Encounter (HOSPITAL_COMMUNITY): Payer: Self-pay | Admitting: Emergency Medicine

## 2018-10-06 ENCOUNTER — Emergency Department (HOSPITAL_COMMUNITY)
Admission: EM | Admit: 2018-10-06 | Discharge: 2018-10-06 | Disposition: A | Discharge status: Home / Self Care | Payer: Medicare Other | Attending: Emergency Medicine | Admitting: Emergency Medicine

## 2018-10-06 ENCOUNTER — Other Ambulatory Visit: Payer: Self-pay

## 2018-10-06 DIAGNOSIS — R51 Headache: Secondary | ICD-10-CM | POA: Insufficient documentation

## 2018-10-06 DIAGNOSIS — Z5321 Procedure and treatment not carried out due to patient leaving prior to being seen by health care provider: Secondary | ICD-10-CM | POA: Diagnosis not present

## 2018-10-06 NOTE — ED Notes (Signed)
No active bleeding to wounds.  Pt states he does not want to wait to be seen.
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
## 2018-10-21 ENCOUNTER — Ambulatory Visit (INDEPENDENT_AMBULATORY_CARE_PROVIDER_SITE_OTHER): Payer: Medicare Other | Admitting: Otolaryngology

## 2018-10-21 DIAGNOSIS — H608X3 Other otitis externa, bilateral: Secondary | ICD-10-CM

## 2018-10-22 ENCOUNTER — Other Ambulatory Visit: Payer: Self-pay | Admitting: "Endocrinology

## 2018-10-22 ENCOUNTER — Other Ambulatory Visit: Payer: Self-pay | Admitting: Internal Medicine

## 2018-10-28 ENCOUNTER — Ambulatory Visit (INDEPENDENT_AMBULATORY_CARE_PROVIDER_SITE_OTHER): Payer: Medicare Other | Admitting: *Deleted

## 2018-10-28 DIAGNOSIS — R55 Syncope and collapse: Secondary | ICD-10-CM

## 2018-10-29 LAB — CUP PACEART REMOTE DEVICE CHECK
Date Time Interrogation Session: 20200228013531
MDC IDC PG IMPLANT DT: 20181029

## 2018-10-31 DIAGNOSIS — G4733 Obstructive sleep apnea (adult) (pediatric): Secondary | ICD-10-CM | POA: Diagnosis not present

## 2018-11-02 ENCOUNTER — Ambulatory Visit: Payer: Medicare Other | Admitting: "Endocrinology

## 2018-11-03 DIAGNOSIS — E1159 Type 2 diabetes mellitus with other circulatory complications: Secondary | ICD-10-CM | POA: Diagnosis not present

## 2018-11-03 DIAGNOSIS — E559 Vitamin D deficiency, unspecified: Secondary | ICD-10-CM | POA: Diagnosis not present

## 2018-11-03 DIAGNOSIS — E782 Mixed hyperlipidemia: Secondary | ICD-10-CM | POA: Diagnosis not present

## 2018-11-03 DIAGNOSIS — R635 Abnormal weight gain: Secondary | ICD-10-CM | POA: Diagnosis not present

## 2018-11-03 NOTE — Progress Notes (Signed)
[facility] Summary Report / Loop Recorder 

## 2018-11-04 LAB — COMPLETE METABOLIC PANEL WITH GFR
AG Ratio: 1.7 (calc) (ref 1.0–2.5)
ALKALINE PHOSPHATASE (APISO): 95 U/L (ref 35–144)
ALT: 36 U/L (ref 9–46)
AST: 34 U/L (ref 10–35)
Albumin: 4.4 g/dL (ref 3.6–5.1)
BILIRUBIN TOTAL: 0.7 mg/dL (ref 0.2–1.2)
BUN/Creatinine Ratio: 16 (calc) (ref 6–22)
BUN: 10 mg/dL (ref 7–25)
CO2: 31 mmol/L (ref 20–32)
CREATININE: 0.63 mg/dL — AB (ref 0.70–1.33)
Calcium: 9.6 mg/dL (ref 8.6–10.3)
Chloride: 101 mmol/L (ref 98–110)
GFR, EST NON AFRICAN AMERICAN: 112 mL/min/{1.73_m2} (ref >=60)
GFR, Est African American: 129 mL/min/{1.73_m2} (ref >=60)
GLUCOSE: 128 mg/dL — AB (ref 65–99)
Globulin: 2.6 g/dL (calc) (ref 1.9–3.7)
Potassium: 4.5 mmol/L (ref 3.5–5.3)
Sodium: 140 mmol/L (ref 135–146)
TOTAL PROTEIN: 7 g/dL (ref 6.1–8.1)

## 2018-11-04 LAB — HEMOGLOBIN A1C
EAG (MMOL/L): 7.3 (calc)
Hgb A1c MFr Bld: 6.2 % of total Hgb — ABNORMAL HIGH (ref <5.7)
Mean Plasma Glucose: 131 (calc)

## 2018-11-04 LAB — VITAMIN D 25 HYDROXY (VIT D DEFICIENCY, FRACTURES): VIT D 25 HYDROXY: 11 ng/mL — AB (ref 30–100)

## 2018-11-04 LAB — LIPID PANEL
CHOL/HDL RATIO: 2.9 (calc) (ref <5.0)
CHOLESTEROL: 115 mg/dL (ref <200)
HDL: 40 mg/dL (ref >=40)
LDL CHOLESTEROL (CALC): 52 mg/dL
NON-HDL CHOLESTEROL (CALC): 75 mg/dL (ref <130)
TRIGLYCERIDES: 152 mg/dL — AB (ref <150)

## 2018-11-04 LAB — T4, FREE: Free T4: 1.5 ng/dL (ref 0.8–1.8)

## 2018-11-04 LAB — TSH: TSH: 1.58 mIU/L (ref 0.40–4.50)
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
## 2018-11-04 IMAGING — CT CT HEAD W/O CM
3 series · 15 of 47 positions shown, 18 images · non-contrast
Comparison: Head CT scan 06/20/2013.

CLINICAL DATA: Altered mental status today.

EXAM:
CT HEAD WITHOUT CONTRAST
TECHNIQUE: Contiguous axial images were obtained from the base of the skull
through the vertex without intravenous contrast.

[Series 2: head trauma wo · axial · 0.44mm/px · z∈[+1506,+1636]mm · 9 of 32 slices shown, 12 images]
[im 3/32  brain]
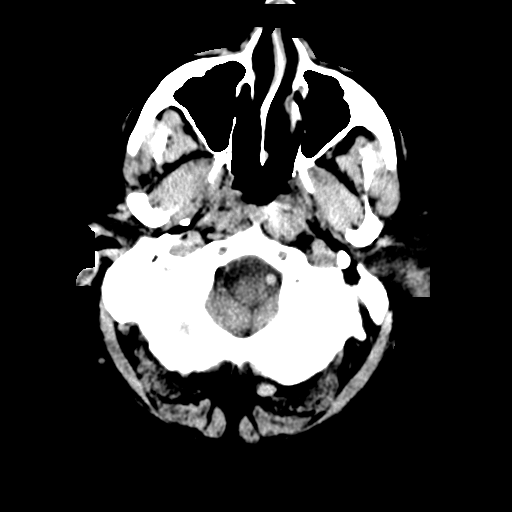
[im 3/32  bone]
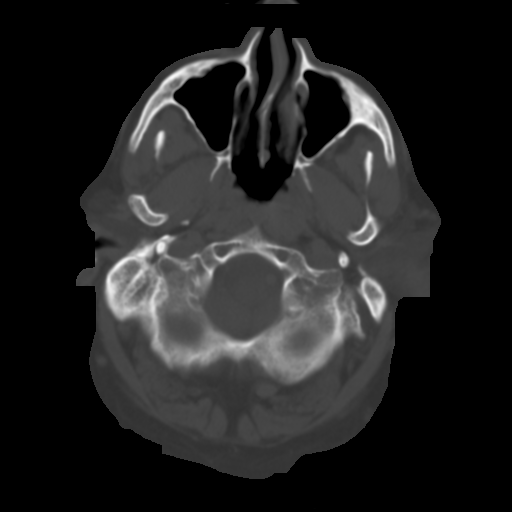
[im 6/32  brain]
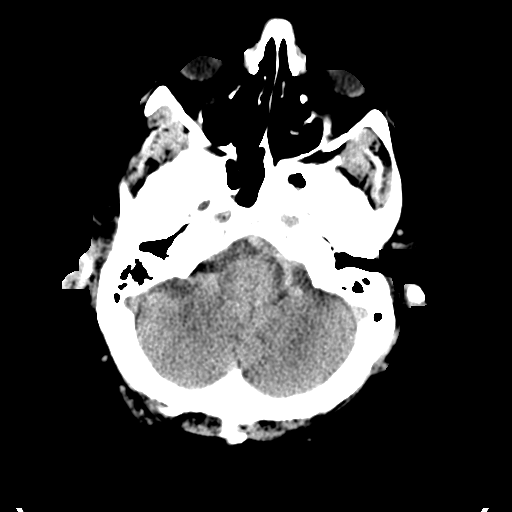
[im 9/32  brain]
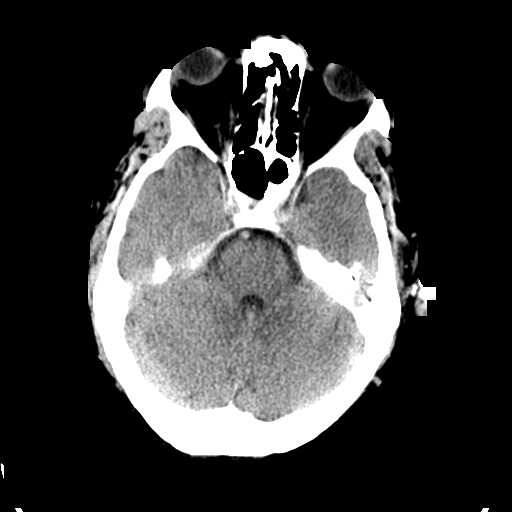
[im 12/32  brain]
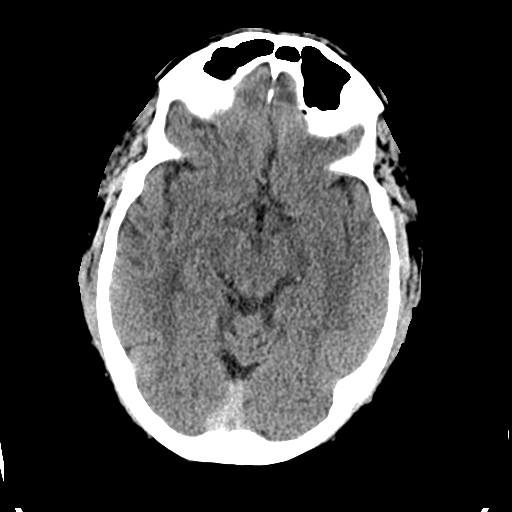
[im 17/32  brain]
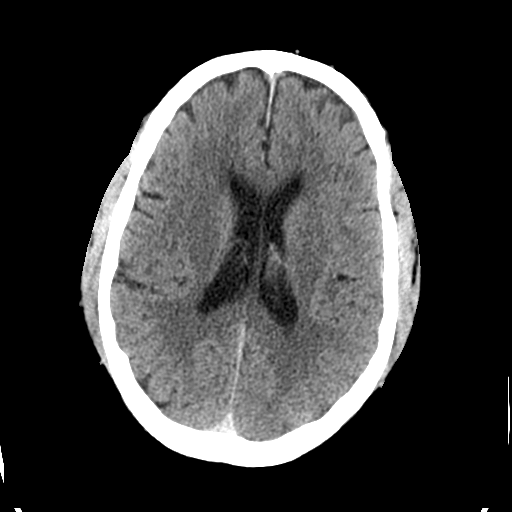
[im 17/32  bone]
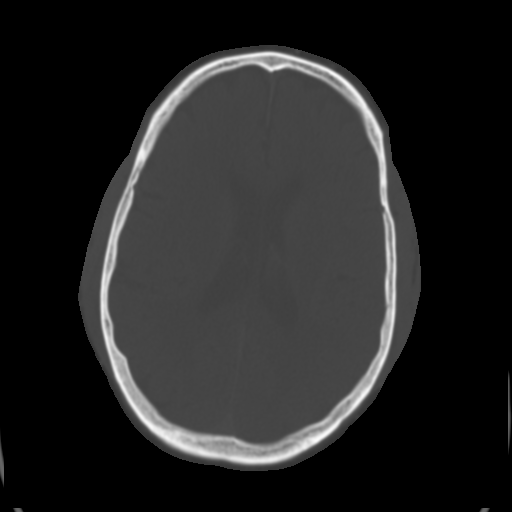
[im 20/32  brain]
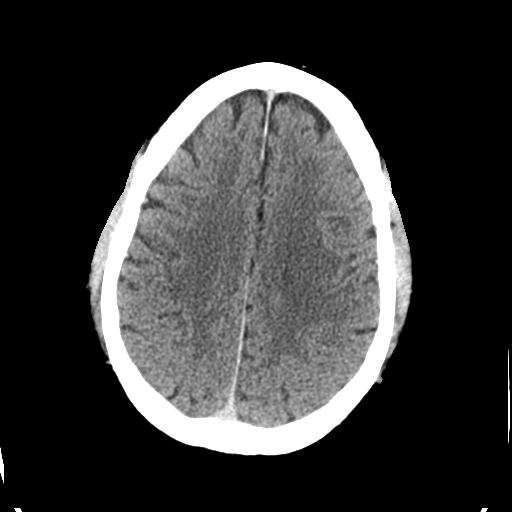
[im 23/32  brain]
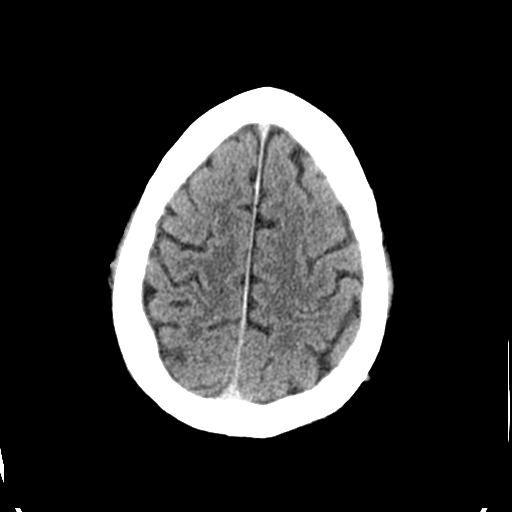
[im 26/32  brain]
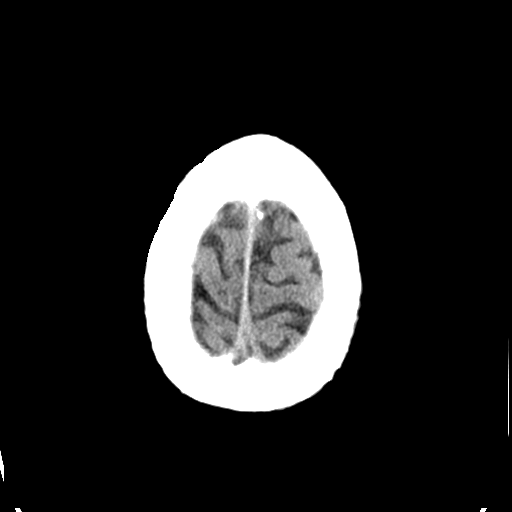
[im 29/32  brain]
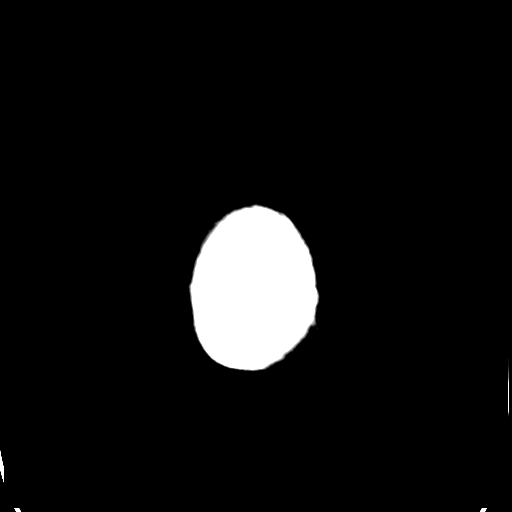
[im 29/32  bone]
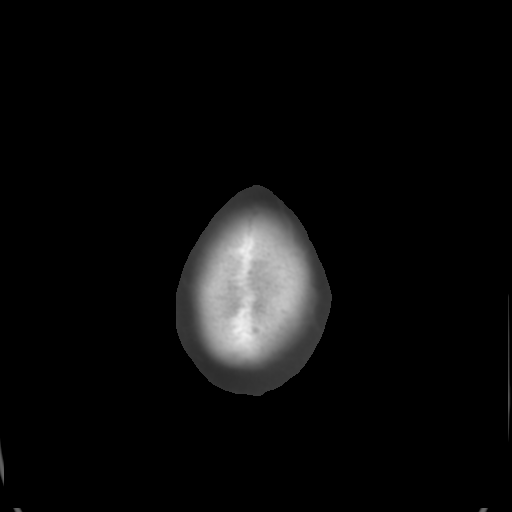

[Series 4: coronal soft tissue · coronal · 0.31mm/px · 3 of 71 slices shown]
[im 24/71  brain]
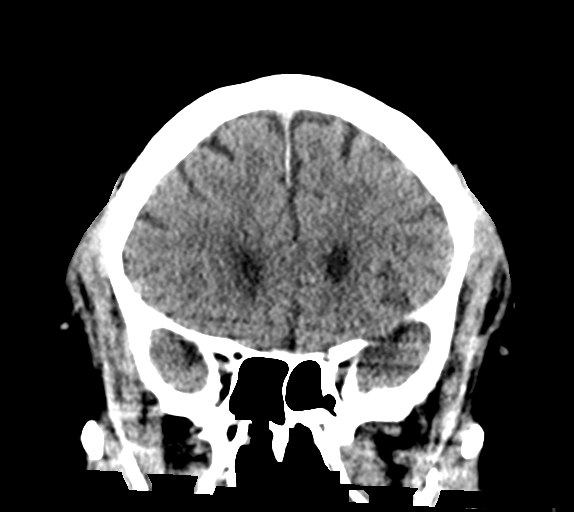
[im 32/71  brain]
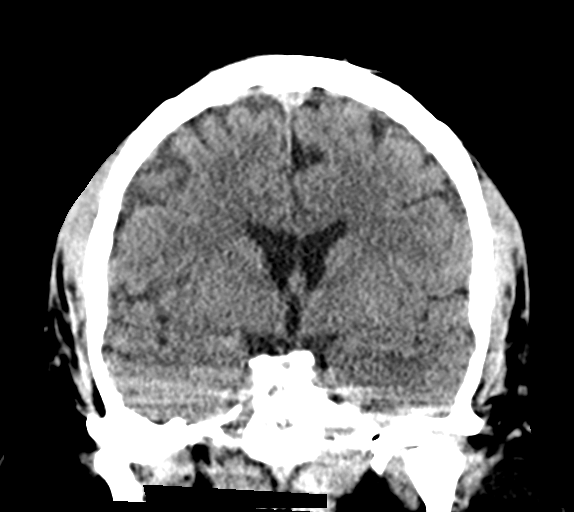
[im 39/71  brain]
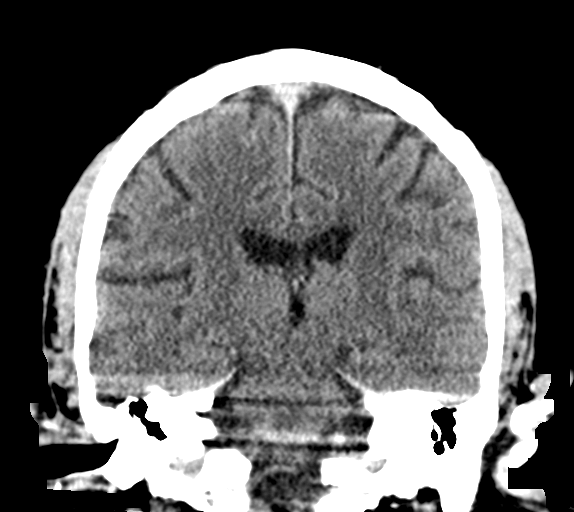

[Series 5: sagittal soft tissue · sagittal · 0.32mm/px · 3 of 58 slices shown]
[im 20/58  brain]
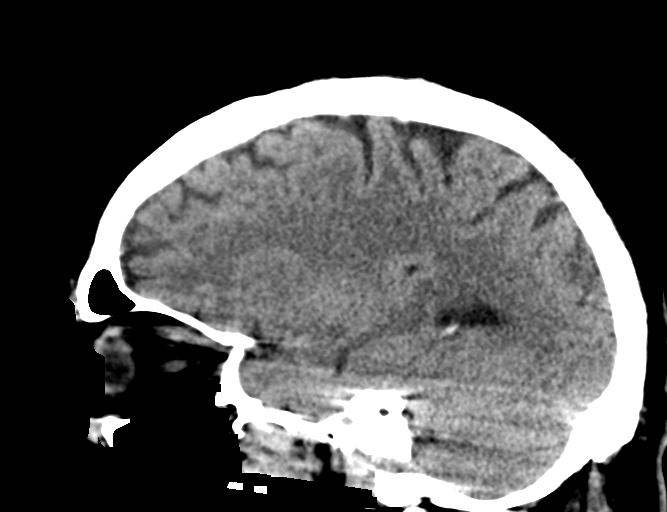
[im 29/58  brain]
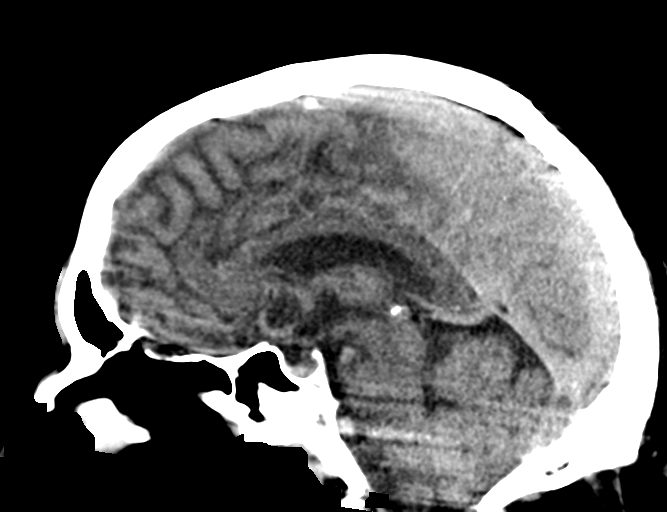
[im 39/58  brain]
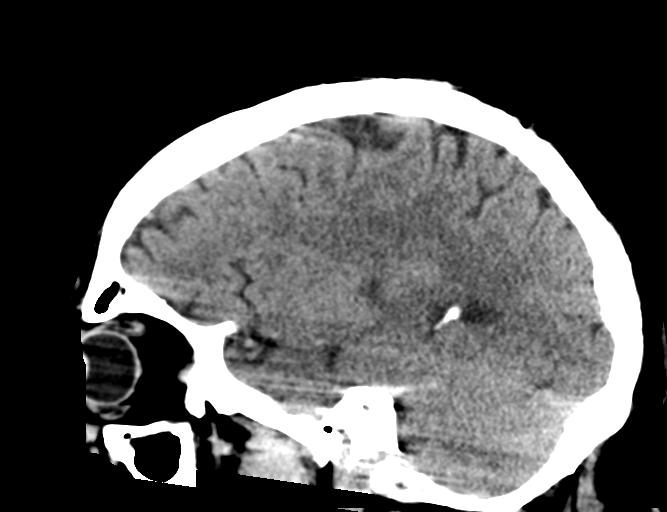

[15 of 47 positions shown; findings below may reference images not displayed]

FINDINGS: Brain: There is no evidence of acute intracranial abnormality
including hemorrhage, infarct, mass lesion, mass effect, midline
shift or abnormal extra-axial fluid collection. No hydrocephalus or
pneumocephalus.

Vascular: Carotid atherosclerosis noted.

Skull: Intact.

Sinuses/Orbits: Minimal mucosal thickening left sphenoid sinus is
seen.

Other: Calcifications are present throughout the pinnae of both ears
consistent with some remote infectious or inflammatory process,
unchanged.
IMPRESSION: No acute intracranial abnormality.

Carotid atherosclerosis.

Mild mucosal thickening left sphenoid sinus.

## 2018-11-08 ENCOUNTER — Ambulatory Visit (INDEPENDENT_AMBULATORY_CARE_PROVIDER_SITE_OTHER): Payer: Medicare Other | Admitting: Gastroenterology

## 2018-11-08 ENCOUNTER — Encounter: Payer: Self-pay | Admitting: Gastroenterology

## 2018-11-08 ENCOUNTER — Telehealth: Payer: Self-pay | Admitting: Gastroenterology

## 2018-11-08 VITALS — BP 140/88 | HR 92 | Temp 97.7°F | Ht 65.0 in | Wt 212.6 lb

## 2018-11-08 DIAGNOSIS — K2270 Barrett's esophagus without dysplasia: Secondary | ICD-10-CM

## 2018-11-08 DIAGNOSIS — K625 Hemorrhage of anus and rectum: Secondary | ICD-10-CM | POA: Diagnosis not present

## 2018-11-08 DIAGNOSIS — R7989 Other specified abnormal findings of blood chemistry: Secondary | ICD-10-CM

## 2018-11-08 DIAGNOSIS — K760 Fatty (change of) liver, not elsewhere classified: Secondary | ICD-10-CM | POA: Diagnosis not present

## 2018-11-08 DIAGNOSIS — R160 Hepatomegaly, not elsewhere classified: Secondary | ICD-10-CM

## 2018-11-08 DIAGNOSIS — R945 Abnormal results of liver function studies: Secondary | ICD-10-CM

## 2018-11-08 DIAGNOSIS — K7460 Unspecified cirrhosis of liver: Secondary | ICD-10-CM | POA: Insufficient documentation

## 2018-11-08 HISTORY — DX: Hemorrhage of anus and rectum: K62.5

## 2018-11-08 NOTE — Progress Notes (Signed)
Primary Care Physician: Rosita Fire, MD  Primary Gastroenterologist:  Garfield Cornea, MD   Chief Complaint  Patient presents with  . Hemorrhoids    occ bleeding    HPI: Randy Hebert is a 53 y.o. male here for follow-up.  Seen back in December with multiple GI complaints including left lower quadrant abdominal pain, diarrhea, black stools.  He had been on Pepto/Kaopectate however.  Symptoms began after his son died from drug overdose on 2023/07/09.  Work-up included negative celiac screen/serologies.  He had mild leukocytosis.  Normal hemoglobin.  Due to mild elevation of AST/ALT, he underwent additional labs showing negative hepatitis C antibody, negative hepatitis B surface antigen, hemochromatosis screen negative. Cdiff and gi path panel both negative. Abdominal ultrasound showed fatty liver. Normal spleen. Normal gb.   Occasional brbpr. ?hemorrhoids. Usually happens when he eats fatty meal. He will have some loose stool, little mucous in stool, brbpr on toilet tissue. If healthy meal, no problems. brbpr happens about once per month. GERD controlled unless he eats something he shouldn't. He is coping better with death of his son. BMs have settled down near normal. No further abdominal pain. Marland Kitchen  His last colonoscopy was two years ago. He is not interested in pursuing one right now unless he has to.   He states he is drinking a shot of whiskey at nighttime sometimes to help him sleep.  Denies any illicit drug use.   Current Outpatient Medications  Medication Sig Dispense Refill  . acetaminophen (TYLENOL) 325 MG tablet Take 650 mg by mouth every 6 (six) hours as needed for mild pain.    Marland Kitchen albuterol (PROVENTIL) (2.5 MG/3ML) 0.083% nebulizer solution Take 3 mLs (2.5 mg total) by nebulization every 6 (six) hours as needed for wheezing or shortness of breath. 150 mL 1  . amLODipine (NORVASC) 10 MG tablet Take 1 tablet (10 mg total) by mouth daily. 30 tablet 6  . aspirin 81 MG EC  tablet Take 1 tablet (81 mg total) by mouth daily. 30 tablet   . atorvastatin (LIPITOR) 40 MG tablet TAKE 1 TABLET BY MOUTH ONCE A DAY. 30 tablet 0  . budesonide-formoterol (SYMBICORT) 160-4.5 MCG/ACT inhaler Inhale 2 puffs into the lungs 2 (two) times daily. 1 Inhaler 5  . Carboxymeth-Glycerin-Polysorb (REFRESH OPTIVE ADVANCED) 0.5-1-0.5 % SOLN Apply 1 drop to eye 2 (two) times daily as needed (dry eyes).     . cetirizine (ZYRTEC) 10 MG tablet Take 10 mg by mouth daily.    . citalopram (CELEXA) 20 MG tablet Take 20 mg by mouth daily.    . cycloSPORINE (RESTASIS) 0.05 % ophthalmic emulsion Place 1 drop into both eyes 2 (two) times daily.    . ferrous sulfate 325 (65 FE) MG tablet Take 325 mg by mouth daily with breakfast.    . fluticasone (FLONASE) 50 MCG/ACT nasal spray Place 2 sprays into both nostrils daily. 16 g 6  . furosemide (LASIX) 40 MG tablet TAKE 1 TABLET BY MOUTH ONCE DAILY. 30 tablet 6  . glucose blood (IGLUCOSE TEST STRIPS) test strip 1 each by Other route 2 (two) times daily. Use as instructed 100 each 12  . Magnesium 200 MG TABS Take 1 tablet (200 mg total) by mouth daily. 30 each 6  . metFORMIN (GLUCOPHAGE) 1000 MG tablet Take 1 tablet (1,000 mg total) by mouth 2 (two) times daily with a meal. 180 tablet 3  . naproxen (NAPROSYN) 500 MG tablet Take 500 mg by mouth  as needed.    . nitroGLYCERIN (NITROSTAT) 0.4 MG SL tablet Place 1 tablet (0.4 mg total) under the tongue every 5 (five) minutes as needed for chest pain. 25 tablet 0  . pantoprazole (PROTONIX) 40 MG tablet TAKE 1 TABLET BY MOUTH ONCE A DAY. 90 tablet 3  . potassium chloride SA (K-DUR,KLOR-CON) 20 MEQ tablet TAKE 1 TABLET BY MOUTH DAILY. 30 tablet 0  . PROVENTIL HFA 108 (90 Base) MCG/ACT inhaler INHALE 2 PUFFS INTO THE LUNGS EVERY 6 HOURS AS NEEDED FOR SHORTNESS OF BREATH OR WHEEZING. 18 g 1  . Tiotropium Bromide Monohydrate (SPIRIVA RESPIMAT) 2.5 MCG/ACT AERS Inhale 1 puff into the lungs daily. 1 Inhaler 5   No current  facility-administered medications for this visit.     Allergies as of 11/08/2018 - Review Complete 11/08/2018  Allergen Reaction Noted  . Carvedilol Other (See Comments) 02/08/2015  . Lisinopril Anaphylaxis, Shortness Of Breath, and Swelling 05/30/2015  . Amoxicillin Nausea And Vomiting 02/06/2015    ROS:  General: Negative for anorexia, weight loss, fever, chills, fatigue, weakness. ENT: Negative for hoarseness, difficulty swallowing , nasal congestion. CV: Negative for chest pain, angina, palpitations, dyspnea on exertion, peripheral edema.  Respiratory: Negative for dyspnea at rest, dyspnea on exertion, cough, sputum, wheezing.  GI: See history of present illness. GU:  Negative for dysuria, hematuria, urinary incontinence, urinary frequency, nocturnal urination.  Endo: Negative for unusual weight change.    Physical Examination:   BP 140/88   Pulse 92   Temp 97.7 F (36.5 C) (Oral)   Ht 5\' 5"  (1.651 m)   Wt 212 lb 9.6 oz (96.4 kg)   BMI 35.38 kg/m   General: Well-nourished, well-developed in no acute distress.  Eyes: No icterus. Mouth: Oropharyngeal mucosa moist and pink , no lesions erythema or exudate. Lungs: Clear to auscultation bilaterally.  Heart: Regular rate and rhythm, no murmurs rubs or gallops.  Abdomen: Bowel sounds are normal, nontender, nondistended, no plenomegaly or masses, no abdominal bruits, no rebound or guarding.  Liver edge is easily palpable 9 fingerbreadths below the right costal margin in the midclavicular line.  Cannot palpate left hepatic lobe in the left upper quadrant.  Rectus diastases noted. Extremities: No lower extremity edema. No clubbing or deformities. Neuro: Alert and oriented x 4   Skin: Warm and dry, no jaundice.   Psych: Alert and cooperative, normal mood and affect.  Labs:  Lab Results  Component Value Date   CREATININE 0.63 (L) 11/03/2018   BUN 10 11/03/2018   NA 140 11/03/2018   K 4.5 11/03/2018   CL 101 11/03/2018   CO2  31 11/03/2018   Lab Results  Component Value Date   ALT 36 11/03/2018   AST 34 11/03/2018   ALKPHOS 98 04/15/2017   BILITOT 0.7 11/03/2018   Lab Results  Component Value Date   WBC 12.2 (H) 08/04/2018   HGB 14.4 08/04/2018   HCT 41.9 08/04/2018   MCV 85.3 08/04/2018   PLT 307 08/04/2018   Lab Results  Component Value Date   TSH 1.58 11/03/2018   Lab Results  Component Value Date   HGBA1C 6.2 (H) 11/03/2018    Imaging Studies: No results found.

## 2018-11-08 NOTE — Patient Instructions (Signed)
1. Please call if you notice more frequent rectal bleeding, blood in stool, etc. Would consider updating colonoscopy if you have these changes.  2. Try to limit any type of alcohol consumption given fatty liver. This will increase your risk of liver scarring and cirrhosis.  3. Return to the office in six months to see Dr. Gala Romney.   Instructions for fatty liver: Recommend 1-2# weight loss per week until ideal body weight through exercise & diet. Low fat/cholesterol diet.   Avoid sweets, sodas, fruit juices, sweetened beverages like tea, etc. Gradually increase exercise from 15 min daily up to 1 hr per day 5 days/week. Limit alcohol use.   Fatty Liver Disease  Fatty liver disease occurs when too much fat has built up in your liver cells. Fatty liver disease is also called hepatic steatosis or steatohepatitis. The liver removes harmful substances from your bloodstream and produces fluids that your body needs. It also helps your body use and store energy from the food you eat. In many cases, fatty liver disease does not cause symptoms or problems. It is often diagnosed when tests are being done for other reasons. However, over time, fatty liver can cause inflammation that may lead to more serious liver problems, such as scarring of the liver (cirrhosis) and liver failure. Fatty liver is associated with insulin resistance, increased body fat, high blood pressure (hypertension), and high cholesterol. These are features of metabolic syndrome and increase your risk for stroke, diabetes, and heart disease. What are the causes? This condition may be caused by:  Drinking too much alcohol.  Poor nutrition.  Obesity.  Cushing's syndrome.  Diabetes.  High cholesterol.  Certain drugs.  Poisons.  Some viral infections.  Pregnancy. What increases the risk? You are more likely to develop this condition if you:  Abuse alcohol.  Are overweight.  Have diabetes.  Have hepatitis.  Have a  high triglyceride level.  Are pregnant. What are the signs or symptoms? Fatty liver disease often does not cause symptoms. If symptoms do develop, they can include:  Fatigue.  Weakness.  Weight loss.  Confusion.  Abdominal pain.  Nausea and vomiting.  Yellowing of your skin and the white parts of your eyes (jaundice).  Itchy skin. How is this diagnosed? This condition may be diagnosed by:  A physical exam and medical history.  Blood tests.  Imaging tests, such as an ultrasound, CT scan, or MRI.  A liver biopsy. A small sample of liver tissue is removed using a needle. The sample is then looked at under a microscope. How is this treated? Fatty liver disease is often caused by other health conditions. Treatment for fatty liver may involve medicines and lifestyle changes to manage conditions such as:  Alcoholism.  High cholesterol.  Diabetes.  Being overweight or obese. Follow these instructions at home:   Do not drink alcohol. If you have trouble quitting, ask your health care provider how to safely quit with the help of medicine or a supervised program. This is important to keep your condition from getting worse.  Eat a healthy diet as told by your health care provider. Ask your health care provider about working with a diet and nutrition specialist (dietitian) to develop an eating plan.  Exercise regularly. This can help you lose weight and control your cholesterol and diabetes. Talk to your health care provider about an exercise plan and which activities are best for you.  Take over-the-counter and prescription medicines only as told by your health care provider.  Keep all follow-up visits as told by your health care provider. This is important. Contact a health care provider if: You have trouble controlling your:  Blood sugar. This is especially important if you have diabetes.  Cholesterol.  Drinking of alcohol. Get help right away if:  You have  abdominal pain.  You have jaundice.  You have nausea and vomiting.  You vomit blood or material that looks like coffee grounds.  You have stools that are black, tar-like, or bloody. Summary  Fatty liver disease develops when too much fat builds up in the cells of your liver.  Fatty liver disease often causes no symptoms or problems. However, over time, fatty liver can cause inflammation that may lead to more serious liver problems, such as scarring of the liver (cirrhosis).  You are more likely to develop this condition if you abuse alcohol, are pregnant, are overweight, have diabetes, have hepatitis, or have high triglyceride levels.  Contact your health care provider if you have trouble controlling your weight, blood sugar, cholesterol, or drinking of alcohol. This information is not intended to replace advice given to you by your health care provider. Make sure you discuss any questions you have with your health care provider. Document Released: 10/03/2005 Document Revised: 05/27/2017 Document Reviewed: 05/27/2017 Elsevier Interactive Patient Education  2019 [employer].

## 2018-11-08 NOTE — Assessment & Plan Note (Addendum)
Occasional bright red blood on the toilet tissue, likely benign anorectal source.  Last colonoscopy 2 years ago.  He is on Effient.  He declined rectal exam today.  He agrees that if he has increased frequency of bright red blood per rectum, or has recurrent bowel issues as before, he will let us know and at that time would pursue colonoscopy.  At this time he is not interested.  Return to the office in 6 months or call sooner if needed.

## 2018-11-08 NOTE — Telephone Encounter (Signed)
CT scheduled for 3/24 at 2:00pm, arrival time 1:45pm, npo 4 hrs prior, p/u oral contrast w/ instructions. LMOVM for pt  [place] website and no PA is required for ct.

## 2018-11-08 NOTE — Assessment & Plan Note (Signed)
Currently transaminases are normal.  Work-up as previously outlined.  On exam he appears to have hepatomegaly although this was not mentioned on ultrasound a few months back.  We will request reread for liver dimensions. Urged to limit etoh use. Increased daily activity, modest weight loss, continue tight glycemic control.    Return to the office in 6 months.

## 2018-11-08 NOTE — Assessment & Plan Note (Signed)
Initially plan for EGD in January 2019 for surveillance given history of Barrett's esophagus.  However based on current guidelines, discussion with Dr. Gala Romney, he is due in January 2021.  Continue pantoprazole 40 mg daily.

## 2018-11-08 NOTE — Telephone Encounter (Signed)
Please let patient know that I reviewed his u/s with radiologist, Dr. Kris Hartmann. Could not give size of liver on u/s but appears fatty. Spleen normal in size. His liver could be enlarged due to fatty liver as well as several other causes. It could also just be his anatomy.   In order to investigate this I would recommend CT Abd with contrast for hepatomegaly/abnormal lfts, if he is agreeable.

## 2018-11-08 NOTE — Addendum Note (Signed)
Addended by: Inge Rise on: 11/08/2018 03:40 PM   Modules accepted: Orders

## 2018-11-08 NOTE — Telephone Encounter (Signed)
Pt was notified of results. Pt is agreeable to the CT scan and would like to have it after this week since his schedule booked with other responsibilities.

## 2018-11-09 ENCOUNTER — Encounter: Payer: Self-pay | Admitting: "Endocrinology

## 2018-11-09 ENCOUNTER — Ambulatory Visit (INDEPENDENT_AMBULATORY_CARE_PROVIDER_SITE_OTHER): Payer: Medicare Other | Admitting: "Endocrinology

## 2018-11-09 VITALS — BP 154/89 | HR 89 | Ht 65.0 in | Wt 213.0 lb

## 2018-11-09 DIAGNOSIS — I10 Essential (primary) hypertension: Secondary | ICD-10-CM | POA: Diagnosis not present

## 2018-11-09 DIAGNOSIS — E1159 Type 2 diabetes mellitus with other circulatory complications: Secondary | ICD-10-CM

## 2018-11-09 DIAGNOSIS — E559 Vitamin D deficiency, unspecified: Secondary | ICD-10-CM

## 2018-11-09 DIAGNOSIS — E66812 Obesity, class 2: Secondary | ICD-10-CM

## 2018-11-09 DIAGNOSIS — E782 Mixed hyperlipidemia: Secondary | ICD-10-CM | POA: Diagnosis not present

## 2018-11-09 DIAGNOSIS — F17200 Nicotine dependence, unspecified, uncomplicated: Secondary | ICD-10-CM

## 2018-11-09 DIAGNOSIS — Z6835 Body mass index (BMI) 35.0-35.9, adult: Secondary | ICD-10-CM

## 2018-11-09 MED ORDER — VITAMIN D3 125 MCG (5000 UT) PO CAPS: 5000.0000 [IU] | ORAL_CAPSULE | Freq: Every day | ORAL | 0 refills | Status: DC

## 2018-11-09 NOTE — Telephone Encounter (Signed)
Patient called back and is aware of appt details 

## 2018-11-09 NOTE — Telephone Encounter (Signed)
LMOMVM for pt

## 2018-11-09 NOTE — Progress Notes (Signed)
Endocrinology follow-up note       11/09/2018, 9:28 AM   Subjective:    Patient ID: Randy Hebert, male    DOB: 08-09-1966.  Randy Hebert is being seen in follow-up for management of currently uncontrolled symptomatic type 2 diabetes, hyperlipidemia, hypertension. PMD:  Rosita Fire, MD.   Past Medical History:  Diagnosis Date  . Allergy   . Anxiety   . Arthritis   . Asthma    hip replacement  . Back pain   . Bulging of cervical intervertebral disc   . CAD (coronary artery disease)    lateral STEMI 02/06/2015 00% D1 occlusion treated with Promus Premier 2.5 mm x 16 mm DES, 70% ramus stenosis, 40% mid RCA stenosis, 45% distal RCA stenosis, EF 45-50%  . CHF (congestive heart failure) ([place])   . COPD (chronic obstructive pulmonary disease) ([place])   . Depression   . Diabetes mellitus without complication ([place])   . 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 Dry eye   . Dyspnea   . Early satiety 09/23/2016  . GERD (gastroesophageal reflux disease)   . Headache   . Heart murmur   . Hip pain   . Hyperlipidemia   . Hypertension   . MI (myocardial infarction) ([place])   . Myocardial infarction ([place])   . Neck pain   . Otitis media   . Pleurisy   . Sinus pause    9 sec sinus pause on telemetry after started on coreg after MI, avoid AV nodal blocking agent  . Sleep apnea   . 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 Unilateral primary osteoarthritis, right hip 07/01/2016   Past Surgical History:  Procedure Laterality Date  . BIOPSY  10/09/2016   Procedure: BIOPSY;  Surgeon: Daneil Dolin, MD;  [facility];  Service: Endoscopy;;  . CARDIAC CATHETERIZATION N/A 02/06/2015   Procedure: Left Heart Cath and Coronary Angiography;  Surgeon: Leonie Man, MD;  Location: [facility];  Service: Cardiovascular;  Laterality: N/A;  . CARDIAC CATHETERIZATION  N/A 02/06/2015   Procedure: Coronary Stent Intervention;  Surgeon: Leonie Man, MD;  Location: [facility];  Service: Cardiovascular;  Laterality: N/A;  . COLONOSCOPY WITH PROPOFOL N/A 10/09/2016   Procedure: COLONOSCOPY WITH PROPOFOL;  Surgeon: Daneil Dolin, MD;  [facility];  Service: Endoscopy;  Laterality: N/A;  12:00 pm  . COLONOSCOPY WITH PROPOFOL N/A 11/24/2016   Dr. Gala Romney: Multiple colon polyps removed, tubular adenoma.  Next colonoscopy in 5 years.  . CORONARY ANGIOPLASTY WITH STENT PLACEMENT  01/2015  . CORONARY STENT INTERVENTION N/A 04/27/2017   Procedure: CORONARY STENT INTERVENTION;  Surgeon: Nelva Bush, MD;  Location: [facility];  Service: Cardiovascular;  Laterality: N/A;  . ELECTROPHYSIOLOGY STUDY N/A 06/29/2017   Procedure: ELECTROPHYSIOLOGY STUDY;  Surgeon: Evans Lance, MD;  Location: [facility];  Service: Cardiovascular;  Laterality: N/A;  . ESOPHAGOGASTRODUODENOSCOPY (EGD) WITH PROPOFOL N/A 10/09/2016   Dr. Gala Romney: LA grade a esophagitis.  Barrett's esophagus, biopsy-proven.  Small hiatal hernia.  EGD  February 2019  . INCISION / DRAINAGE HAND / FINGER    . LEFT HEART CATH AND CORONARY ANGIOGRAPHY N/A 04/27/2017   Procedure: LEFT HEART CATH AND CORONARY ANGIOGRAPHY;  Surgeon: Nelva Bush, MD;  Location: [facility];  Service: Cardiovascular;  Laterality: N/A;  . LOOP RECORDER INSERTION  06/29/2017   Procedure: Loop Recorder Insertion;  Surgeon: Evans Lance, MD;  Location: [facility];  Service: Cardiovascular;;  . POLYPECTOMY  11/24/2016   Procedure: POLYPECTOMY;  Surgeon: Daneil Dolin, MD;  [facility];  Service: Endoscopy;;  descending and sigmoid  . TOTAL HIP ARTHROPLASTY Right 07/01/2016  . TOTAL HIP ARTHROPLASTY Right 07/01/2016   Procedure: RIGHT TOTAL HIP ARTHROPLASTY ANTERIOR APPROACH;  Surgeon: Mcarthur Rossetti, MD;  Location: [facility];  Service: Orthopedics;  Laterality: Right;    Social History   Socioeconomic History  . Marital status: Significant Other    Spouse name: alice  . Number of children: 3  . Years of education: 87  . Highest education level: Not on file  Occupational History  . Occupation: unemployed    Comment: Nature conservation officer    Comment: disability pending  Social Needs  . Financial resource strain: Not on file  . Food insecurity:    Worry: Not on file    Inability: Not on file  . Transportation needs:    Medical: Not on file    Non-medical: Not on file  Tobacco Use  . Smoking status: Current Every Day Smoker    Packs/day: 1.50    Years: 46.00    Pack years: 69.00    Types: Cigarettes  . Smokeless tobacco: Former Systems developer    Types: Chew  Substance and Sexual Activity  . Alcohol use: Yes    Alcohol/week: 11.0 - 12.0 standard drinks    Types: 6 Cans of beer, 5 - 6 Shots of liquor per week    Comment: alcoholic, now drinks intermittently.  As of 11/08/18, patient reports that he may have a drink of whiskey to 3 times per week.  He denies ever being an alcoholic  . Drug use: No    Comment: history of drug use- marijuana, cocaine- quit 2014  . Sexual activity: Yes    Partners: Female    Birth control/protection: None  Lifestyle  . Physical activity:    Days per week: Not on file    Minutes per session: Not on file  . Stress: Not on file  Relationships  . Social connections:    Talks on phone: Not on file    Gets together: Not on file    Attends religious service: Not on file    Active member of club or organization: Not on file    Attends meetings of clubs or organizations: Not on file    Relationship status: Not on file  Other Topics Concern  . Not on file  Social History Narrative   Lives with girlfriend [name]   Leisure - TV   Lost his son 07-03-2018 due to overdose   Outpatient Encounter Medications as of 11/09/2018  Medication Sig  . acetaminophen (TYLENOL) 325 MG tablet Take 650 mg by mouth every 6 (six) hours as needed  for mild pain.  Marland Kitchen albuterol (PROVENTIL) (2.5 MG/3ML) 0.083% nebulizer solution Take 3 mLs (2.5 mg total) by nebulization every 6 (six) hours as needed for wheezing or shortness of breath.  Marland Kitchen amLODipine (NORVASC) 10 MG tablet Take 1 tablet (10 mg total) by mouth daily.  Marland Kitchen  aspirin 81 MG EC tablet Take 1 tablet (81 mg total) by mouth daily.  Marland Kitchen atorvastatin (LIPITOR) 40 MG tablet TAKE 1 TABLET BY MOUTH ONCE A DAY.  . budesonide-formoterol (SYMBICORT) 160-4.5 MCG/ACT inhaler Inhale 2 puffs into the lungs 2 (two) times daily.  . Carboxymeth-Glycerin-Polysorb (REFRESH OPTIVE ADVANCED) 0.5-1-0.5 % SOLN Apply 1 drop to eye 2 (two) times daily as needed (dry eyes).   . cetirizine (ZYRTEC) 10 MG tablet Take 10 mg by mouth daily.  . Cholecalciferol (VITAMIN D3) 125 MCG (5000 UT) CAPS Take 1 capsule (5,000 Units total) by mouth daily.  . citalopram (CELEXA) 20 MG tablet Take 20 mg by mouth daily.  . cycloSPORINE (RESTASIS) 0.05 % ophthalmic emulsion Place 1 drop into both eyes 2 (two) times daily.  . ferrous sulfate 325 (65 FE) MG tablet Take 325 mg by mouth daily with breakfast.  . fluticasone (FLONASE) 50 MCG/ACT nasal spray Place 2 sprays into both nostrils daily.  . furosemide (LASIX) 40 MG tablet TAKE 1 TABLET BY MOUTH ONCE DAILY.  Marland Kitchen glucose blood (IGLUCOSE TEST STRIPS) test strip 1 each by Other route 2 (two) times daily. Use as instructed  . Magnesium 200 MG TABS Take 1 tablet (200 mg total) by mouth daily.  . metFORMIN (GLUCOPHAGE) 1000 MG tablet Take 1 tablet (1,000 mg total) by mouth 2 (two) times daily with a meal.  . naproxen (NAPROSYN) 500 MG tablet Take 500 mg by mouth as needed.  . nitroGLYCERIN (NITROSTAT) 0.4 MG SL tablet Place 1 tablet (0.4 mg total) under the tongue every 5 (five) minutes as needed for chest pain.  . pantoprazole (PROTONIX) 40 MG tablet TAKE 1 TABLET BY MOUTH ONCE A DAY.  Marland Kitchen potassium chloride SA (K-DUR,KLOR-CON) 20 MEQ tablet TAKE 1 TABLET BY MOUTH DAILY.  Marland Kitchen PROVENTIL HFA  108 (90 Base) MCG/ACT inhaler INHALE 2 PUFFS INTO THE LUNGS EVERY 6 HOURS AS NEEDED FOR SHORTNESS OF BREATH OR WHEEZING.  . Tiotropium Bromide Monohydrate (SPIRIVA RESPIMAT) 2.5 MCG/ACT AERS Inhale 1 puff into the lungs daily.   No facility-administered encounter medications on file as of 11/09/2018.     ALLERGIES: Allergies  Allergen Reactions  . Carvedilol Other (See Comments)    Sinus pause on telemetry >3 seconds. Longest one 9 sec. No AV nodal agent  . Lisinopril Anaphylaxis, Shortness Of Breath and Swelling    Angioedema, required intubation and mechanical ventilation  . Amoxicillin Nausea And Vomiting    Nausea only - no allergy    VACCINATION STATUS: Immunization History  Administered Date(s) Administered  . Influenza,inj,Quad PF,6+ Mos 07/03/2016, 05/07/2017  . Pneumococcal Conjugate-13 06/17/2017  . Pneumococcal Polysaccharide-23 02/07/2015    Diabetes  He presents for his follow-up diabetic visit. He has type 2 diabetes mellitus. Onset time: He was diagnosed at approximate age of 69 years. His disease course has been improving. There are no hypoglycemic associated symptoms. Pertinent negatives for hypoglycemia include no confusion, headaches, pallor or seizures. Pertinent negatives for diabetes include no chest pain, no fatigue, no polydipsia, no polyphagia, no polyuria and no weakness. There are no hypoglycemic complications. Symptoms are improving. Diabetic complications include heart disease. Risk factors for coronary artery disease include dyslipidemia, diabetes mellitus, hypertension, male sex, family history, obesity, sedentary lifestyle and tobacco exposure. Current diabetic treatment includes oral agent (monotherapy). He is compliant with treatment most of the time. His weight is increasing steadily. He is following a generally unhealthy diet. When asked about meal planning, he reported none. He has had a previous visit  with a dietitian. He never participates in exercise.  An ACE inhibitor/angiotensin II receptor blocker is contraindicated (He has documented allergy for ACE inhibitor's). He does not see a podiatrist.Eye exam is not current.  Hyperlipidemia  This is a chronic problem. The current episode started more than 1 year ago. Exacerbating diseases include diabetes and obesity. Pertinent negatives include no chest pain, myalgias or shortness of breath. Current antihyperlipidemic treatment includes statins. Risk factors for coronary artery disease include dyslipidemia, diabetes mellitus, family history, obesity, male sex, hypertension and a sedentary lifestyle.  Hypertension  This is a chronic problem. The current episode started more than 1 year ago. Pertinent negatives include no chest pain, headaches, neck pain, palpitations or shortness of breath. Risk factors for coronary artery disease include diabetes mellitus, dyslipidemia, obesity, sedentary lifestyle and smoking/tobacco exposure. Past treatments include calcium channel blockers. Hypertensive end-organ damage includes CAD/MI.    Review of Systems  Constitutional: Negative for chills, fatigue, fever and unexpected weight change.  HENT: Negative for dental problem, mouth sores and trouble swallowing.   Eyes: Negative for visual disturbance.  Respiratory: Negative for cough, choking, chest tightness, shortness of breath and wheezing.   Cardiovascular: Negative for chest pain, palpitations and leg swelling.  Gastrointestinal: Negative for abdominal distention, abdominal pain, constipation, diarrhea, nausea and vomiting.  Endocrine: Negative for polydipsia, polyphagia and polyuria.  Genitourinary: Negative for dysuria, flank pain, hematuria and urgency.  Musculoskeletal: Negative for back pain, gait problem, myalgias and neck pain.  Skin: Negative for pallor, rash and wound.  Neurological: Negative for seizures, syncope, weakness, numbness and headaches.  Psychiatric/Behavioral: Negative.  Negative for  confusion and dysphoric mood.    Objective:    BP (!) 154/89   Pulse 89   Ht 5\' 5"  (1.651 m)   Wt 213 lb (96.6 kg)   BMI 35.45 kg/m   Wt Readings from Last 3 Encounters:  11/09/18 213 lb (96.6 kg)  11/08/18 212 lb 9.6 oz (96.4 kg)  10/06/18 202 lb 13.2 oz (92 kg)     Physical Exam  Constitutional: He is oriented to person, place, and time. He appears well-developed. He is cooperative. No distress.  HENT:  Head: Normocephalic and atraumatic.  Eyes: EOM are normal.  Neck: Normal range of motion. Neck supple. No tracheal deviation present. No thyromegaly present.  Cardiovascular: Normal rate, S1 normal and S2 normal. Exam reveals no gallop.  No murmur heard. Pulses:      Dorsalis pedis pulses are 1+ on the right side and 1+ on the left side.       Posterior tibial pulses are 1+ on the right side and 1+ on the left side.  Pulmonary/Chest: Effort normal. No respiratory distress. He has no wheezes.  Abdominal: He exhibits no distension. There is no abdominal tenderness. There is no guarding and no CVA tenderness.  Musculoskeletal:        General: No edema.     Right shoulder: He exhibits no swelling and no deformity.  Neurological: He is alert and oriented to person, place, and time. He has normal strength. No cranial nerve deficit or sensory deficit. Gait normal.  Skin: Skin is warm and dry. No rash noted. No cyanosis. Nails show no clubbing.  Psychiatric: He has a normal mood and affect. His speech is normal. Cognition and memory are normal.    CMP     Component Value Date/Time   NA 140 11/03/2018 0705   K 4.5 11/03/2018 0705   CL 101 11/03/2018 0705  CO2 31 11/03/2018 0705   GLUCOSE 128 (H) 11/03/2018 0705   BUN 10 11/03/2018 0705   CREATININE 0.63 (L) 11/03/2018 0705   CALCIUM 9.6 11/03/2018 0705   PROT 7.0 11/03/2018 0705   ALBUMIN 3.7 04/15/2017 0927   AST 34 11/03/2018 0705   ALT 36 11/03/2018 0705   ALKPHOS 98 04/15/2017 0927   BILITOT 0.7 11/03/2018 0705    GFRNONAA 112 11/03/2018 0705   GFRAA 129 11/03/2018 0705    Diabetic Labs (most recent): Lab Results  Component Value Date   HGBA1C 6.2 (H) 11/03/2018   HGBA1C 6.7 (H) 06/23/2018   HGBA1C 6.4 (H) 02/18/2018     Lipid Panel ( most recent) Lipid Panel     Component Value Date/Time   CHOL 115 11/03/2018 0705   TRIG 152 (H) 11/03/2018 0705   HDL 40 11/03/2018 0705   CHOLHDL 2.9 11/03/2018 0705   VLDL 41 (H) 04/27/2017 0505   LDLCALC 52 11/03/2018 0705      Lab Results  Component Value Date   TSH 1.58 11/03/2018   TSH 1.79 11/17/2017   TSH 1.635 04/26/2017   TSH 2.564 06/27/2015   FREET4 1.5 11/03/2018   FREET4 1.4 11/17/2017      Assessment & Plan:   1. DM type 2 causing vascular disease ([place])  - Randy Hebert has currently uncontrolled symptomatic type 2 DM since  53 years of age. -He returns with improved A1c of 6.2%, generally improving from 7.6%.    -his diabetes is complicated by  coronary artery disease which required stent placement, obesity/sedentary life, chronic heavy smoking, and BODE PIEPER remains at a high risk for more acute and chronic complications which include CAD, CVA, CKD, retinopathy, and neuropathy. These are all discussed in detail with the patient.  - I have counseled him on diet management and weight loss, by adopting a carbohydrate restricted/protein rich diet. -Still admits to dietary indiscretions including consumption of sweets and sweetened beverages.  - Patient admits there is a room for improvement in his diet and drink choices. -  Suggestion is made for him to avoid simple carbohydrates  from his diet including Cakes, Sweet Desserts / Pastries, Ice Cream, Soda (diet and regular), Sweet Tea, Candies, Chips, Cookies, Store Bought Juices, Alcohol in Excess of  1-2 drinks a day, Artificial Sweeteners, and "Sugar-free" Products. This will help patient to have stable blood glucose profile and potentially avoid unintended weight  gain.  - I encouraged him to switch to  unprocessed or minimally processed complex starch and increased protein intake (animal or plant source), fruits, and vegetables.  - he is advised to stick to a routine mealtimes to eat 3 meals  a day and avoid unnecessary snacks ( to snack only to correct hypoglycemia).    - I have approached him with the following individualized plan to manage diabetes and patient agrees:   -He has maintained control of diabetes without insulin.  Emphasizing the need for him to stay engaged, given his history of coronary artery disease, he is advised to continue metformin 1000 mg p.o. twice daily-daily after breakfast and supper.   - Patient specific target  A1c;  LDL, HDL, Triglycerides, and  Waist Circumference were discussed in detail.  2) BP/HTN: His blood pressure is not controlled to target.  He remains chronic heavy smoker. He has a documented allergy of ACE inhibitors anaphylaxis .  He is advised to continue amlodipine 10 mg p.o. daily, with as needed diuretics.  He is  extensively counseled for smoking cessation.     3) Lipids/HPL: Recent lipid panel showed controlled LDL at 46, triglycerides still high at 215.  He is advised to avoid butter and fried food.  Due to elevated liver enzymes (which are mainly due to his alcohol abuse) I advised him to lower his atorvastatin to 40 mg from 80 mg.  Patient continues to use alcohol heavily, could explain the transaminitis.  He is advised to consider weaning of alcohol off.  4)  Weight/Diet: CDE Consult has been initiated and in progress,  exercise, and detailed carbohydrates information provided.  5) Chronic Care/Health Maintenance:  -he  is on Statin medications and  is encouraged to continue to follow up with Ophthalmology, Dentist,  Podiatrist at least yearly or according to recommendations, and advised to  Quit smoking ( is a chronic heavy smoker for the last 40 days), and consider weaning off of alcohol.  I have  recommended yearly flu vaccine and pneumonia vaccination at least every 5 years; moderate intensity exercise for up to 150 minutes weekly; and  sleep for at least 7 hours a day.  - I advised patient to maintain close follow up with Rosita Fire, MD for primary care needs.   - Time spent with the patient: 25 min, of which >50% was spent in reviewing his  current and  previous labs/studies, previous treatments, and medications doses and developing a plan for long-term care based on the latest recommendations for standards of care. Please refer to " Patient Self Inventory" in the Media  tab for reviewed elements of pertinent patient history. Randy Hebert participated in the discussions, expressed understanding, and voiced agreement with the above plans.  All questions were answered to his satisfaction. he is encouraged to contact clinic should he have any questions or concerns prior to his return visit.   Follow up plan: - Return in about 4 months (around 03/11/2019) for Follow up with Pre-visit Labs.  Glade Lloyd, MD [facility] [address] [place], [place] [address] Phone: [phone]  Fax: [phone]    11/09/2018, 9:28 AM  This note was partially dictated with voice recognition software. Similar sounding words can be transcribed inadequately or may not  be corrected upon review.

## 2018-11-09 NOTE — Progress Notes (Signed)
CC'D TO PCP °

## 2018-11-09 NOTE — Patient Instructions (Signed)

## 2018-11-10 ENCOUNTER — Encounter: Payer: Self-pay | Admitting: Allergy & Immunology

## 2018-11-10 ENCOUNTER — Ambulatory Visit (INDEPENDENT_AMBULATORY_CARE_PROVIDER_SITE_OTHER): Payer: Medicare Other | Admitting: Allergy & Immunology

## 2018-11-10 ENCOUNTER — Other Ambulatory Visit: Payer: Self-pay

## 2018-11-10 VITALS — BP 132/90 | HR 96 | Resp 18

## 2018-11-10 DIAGNOSIS — J302 Other seasonal allergic rhinitis: Secondary | ICD-10-CM | POA: Diagnosis not present

## 2018-11-10 DIAGNOSIS — T781XXD Other adverse food reactions, not elsewhere classified, subsequent encounter: Secondary | ICD-10-CM

## 2018-11-10 DIAGNOSIS — J3089 Other allergic rhinitis: Secondary | ICD-10-CM

## 2018-11-10 DIAGNOSIS — J984 Other disorders of lung: Secondary | ICD-10-CM | POA: Diagnosis not present

## 2018-11-10 DIAGNOSIS — J449 Chronic obstructive pulmonary disease, unspecified: Secondary | ICD-10-CM | POA: Diagnosis not present

## 2018-11-10 MED ORDER — AZELASTINE HCL 0.1 % NA SOLN: 2.0000 | Freq: Two times a day (BID) | NASAL | 5 refills | Status: DC

## 2018-11-10 MED ORDER — EPINEPHRINE 0.3 MG/0.3ML IJ SOAJ: 0.3000 mg | Freq: Once | INTRAMUSCULAR | 2 refills | Status: AC

## 2018-11-10 NOTE — Progress Notes (Signed)
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 presents for skin testing today.  On intradermal testing, he does have multiple positive findings which might be contributing to his rhinitis symptoms.  We did provide avoidance measures and he made the decision to start allergen immunotherapy.  Prescription will be written and he will start injections in 2 weeks.  We did go over the risk and benefits of immunotherapy.  Regarding his lung function, although he feels fairly good on the current regimen, I would like to get a full pulmonary function test to evaluate his DLCO as well as his lung volumes.  He has seen pulmonology in the past, but does not remember the details of these visits.  Plan/Recommendations:   1. Asthma-COPD overlap syndrome -Lung testing looks fairly stable. -We are not going to make any changes since you are doing fairly well. -I do want to get full pulmonary function testing again (we will order that today and they should call you by the end of the week) - Daily controller medication(s): Spiriva 2.23mcg one inhalation once daily and Symbicort 160/4.49mcg two puffs twice daily with spacer - Prior to physical activity: Ventolin 2 puffs 10-15 minutes before physical activity. - Rescue medications: Ventolin 4 puffs every 4-6 hours as needed - Asthma control goals:  * Full participation in all desired activities (may need albuterol before activity) * Albuterol use two time or less a week on average (not counting use  with activity) * Cough interfering with sleep two time or less a month * Oral steroids no more than once a year * No hospitalizations  2. Chronic rhinitis - Testing today showed: grasses, ragweed, weeds, indoor molds, outdoor molds, cat, dog and cockroach - Copy of test results provided.  - Avoidance measures provided. - Continue with: Zyrtec (cetirizine) 10mg  tablet once daily and Flonase (fluticasone) two sprays per nostril daily - Start taking: Astelin (azelastine) 2 sprays per nostril 1-2 times daily as needed - You can use an extra dose of the antihistamine, if needed, for breakthrough symptoms.  - Consider nasal saline rinses 1-2 times daily to remove allergens from the nasal cavities as well as help with mucous clearance (this is especially helpful to do before the nasal sprays are given) - Consider allergy shots as a means of long-term control. - Allergy shots "re-train" and "reset" the immune system to ignore environmental allergens and decrease the resulting immune response to those allergens (sneezing, itchy watery eyes, runny nose, nasal congestion, etc).    - Allergy shots improve symptoms in 75-85% of patients.  - We can discuss more at the next appointment if the medications are not working for you.  3. Adverse food reaction - Testing was negative to cows milk, casein, shellfish mix, and fish mix. - Reaction was not consistent with an IgE mediated reaction anyway, so we will not confirm this with blood testing. - He is able to tolerate these foods when not given together.  4. Return in about 3 months (around 02/10/2019).   Subjective:   Randy Hebert  Randy Hebert is a 53 y.o. male presenting today for follow up of  Chief Complaint  Patient presents with   Follow-up    Randy Hebert has a history of the following: Patient Active Problem List   Diagnosis Date Noted   Fatty liver 11/08/2018   Rectal bleeding 11/08/2018   LLQ pain 08/04/2018   Diarrhea 08/04/2018    Melena 08/04/2018   Barrett's esophagus 08/04/2018   Transaminitis 08/04/2018   Weight gain 06/30/2018   Vitamin D deficiency 06/30/2018   Alcohol abuse 06/30/2018   DM type 2 causing vascular disease ([place]) 08/17/2017   Syncope 06/29/2017   HCAP (healthcare-associated pneumonia) 05/15/2017   Uncontrolled type 2 diabetes mellitus with hyperglycemia, without long-term current use of insulin ([place]) 05/15/2017   Acute on chronic respiratory failure with hypoxia ([place]) 05/15/2017   Lobar pneumonia ([place]) 05/15/2017   Chronic combined systolic and diastolic CHF (congestive heart failure) ([place]) 05/15/2017   Sinus pause 05/15/2017   Syncope and collapse 05/15/2017   Bradycardia 04/28/2017   Sleep apnea 04/28/2017   Non-ST elevation (NSTEMI) myocardial infarction ([place]) 04/27/2017   NSTEMI (non-ST elevated myocardial infarction) ([place]) 04/26/2017   Substance abuse (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   Chronic obstructive pulmonary disease ([place]) 08/27/2015   Cigarette nicotine dependence with nicotine-induced disorder 08/27/2015   Mixed hyperlipidemia 08/27/2015   Class 2 severe obesity due to excess calories with serious comorbidity and body mass index (BMI) of 35.0 to 35.9 in adult ([place]) 08/27/2015   Acute on chronic systolic heart failure ([place]) 06/02/2015   ACE inhibitor-aggravated angioedema    Chronic systolic CHF (congestive heart failure) ([place]) 06/01/2015   CAD (coronary artery disease) 05/31/2015   STEMI (ST elevation myocardial infarction) ([place]) 05/31/2015   Current smoker 05/31/2015   Essential hypertension, benign 02/09/2015    History obtained from: chart review and patient.  [name] is a 53 y.o. male presenting for skin testing.  He was last seen in December 2019.  At that time, we did not do any spirometry since he was  having some abdominal pain.  He felt that the addition of the Spiriva had helped.  We continued Symbicort and Spiriva.  For his rhinitis, we continued Flonase and Astelin.  We had previously done an environmental allergy panel which was negative.  He had a left otitis medias so we started cefdinir as well.  Asthma/Respiratory Symptom History: He has done well since last visit.  He remains on the Symbicort 2 puffs twice daily and Spiriva 1 puff once daily.  He is able to point out the inhalers on our posterior today.  He reports that he does have dry mouth, which is likely from the [place].  He takes a Spiriva in the morning.  He is not willing to give up the Spiriva since he feels that it helps open him up more than any other medication.  He has not needed prednisone since last visit.  He has needed no hospitalizations.  ACT score is 14, indicating excellent asthma control.  He has had a chest CT as recently as September 2018 that showed a right lower worse than right middle lobe airspace disease secondary to aspiration versus contusion.  There were no pulmonary nodules noted.  He does have a CT abdomen pelvis scheduled for March 24 of this year.  Allergic Rhinitis Symptom History: His nasal symptoms remain uncontrolled.  At  the last visit, we continued him on Flonase and Astelin.  We also recommended using nasal saline rinses to help with mucus clearance.  He has been on cetirizine 10 mg daily for quite some time.  He has not needed antibiotics since last time we saw him.  Food Allergy Symptom History: He mostly tolerates all the major food allergens without adverse event.  However, he notes that when he eats seafood followed by cows milk, he develops stomachache and with occasional vomiting.  He has no hives, itching, or other reactions.  He is able to tolerate these foods in isolation.   Last full pulmonary function testing (November 2016)   Otherwise, there have been no changes to his past medical  history, surgical history, family history, or social history.    Review of Systems  Constitutional: Negative.  Negative for fever, malaise/fatigue and weight loss.  HENT: Negative.  Negative for congestion, ear discharge and ear pain.   Eyes: Negative for pain, discharge and redness.  Respiratory: Positive for cough and sputum production. Negative for shortness of breath and wheezing.   Cardiovascular: Negative.  Negative for chest pain and palpitations.  Gastrointestinal: Negative for abdominal pain and heartburn.  Skin: Negative.  Negative for itching and rash.  Neurological: Negative for dizziness and headaches.  Endo/Heme/Allergies: Negative for environmental allergies. Does not bruise/bleed easily.       Objective:   Blood pressure 132/90, pulse 96, resp. rate 18, SpO2 94 %. There is no height or weight on file to calculate BMI.   Physical Exam:  Physical Exam  Constitutional: He appears well-developed.  Talkative male.  HENT:  Head: Normocephalic and atraumatic.  Right Ear: Tympanic membrane, external ear and ear canal normal.  Left Ear: Tympanic membrane and ear canal normal.  Nose: Mucosal edema and rhinorrhea present. No nasal deformity or septal deviation. No epistaxis. Right sinus exhibits no maxillary sinus tenderness and no frontal sinus tenderness. Left sinus exhibits no maxillary sinus tenderness and no frontal sinus tenderness.  Mouth/Throat: Uvula is midline and oropharynx is clear and moist. Mucous membranes are not pale and not dry.  External ears are somewhat deformed.  Eyes: Pupils are equal, round, and reactive to light. Conjunctivae and EOM are normal. Right eye exhibits no chemosis and no discharge. Left eye exhibits no chemosis and no discharge. Right conjunctiva is not injected. Left conjunctiva is not injected.  Cardiovascular: Normal rate, regular rhythm and normal heart sounds.  Respiratory: Effort normal and breath sounds normal. No accessory muscle  usage. No tachypnea. No respiratory distress. He has no wheezes. He has no rhonchi. He has no rales. He exhibits no tenderness.  Somewhat decreased air movement at the bases.  Lymphadenopathy:    He has no cervical adenopathy.  Neurological: He is alert.  Skin: No abrasion, no petechiae and no rash noted. Rash is not papular, not vesicular and not urticarial. No erythema. No pallor.  Psychiatric: He has a normal mood and affect.     Diagnostic studies:    Spirometry: results abnormal (FEV1: 1.73/58%, FVC: 2.17/54%, FEV1/FVC: 79%).    Spirometry consistent with possible restrictive disease.   Allergy Studies:    Airborne Adult Perc - 11/10/18 0945    Time Antigen Placed  0945    Allergen Manufacturer  Lavella Hammock    Location  Back    Number of Test  59    Panel 1  Select    1. Control-Buffer 50% Glycerol  Negative    2. Control-Histamine 1 mg/ml  2+    3. Albumin saline  Negative    4. [place]  Negative    5. [place]  Negative    6. Johnson  Negative    7. [place] Blue  Negative    8. Meadow Fescue  Negative    9. Perennial Rye  Negative    10. Sweet Vernal  Negative    11. Timothy  Negative    12. Cocklebur  Negative    13. Burweed [place]er  Negative    14. Ragweed, short  Negative    15. Ragweed, Giant  Negative    16. Plantain,  English  Negative    17. Lamb's Quarters  Negative    18. Sheep Sorrell  Negative    19. Rough Pigweed  Negative    20. Marsh Elder, Rough  Negative    21. Mugwort, Common  Negative    22. Ash mix  Negative    23. Birch mix  Negative    24. Beech American  Negative    25. Box, Elder  Negative    26. Cedar, red  Negative    27. Cottonwood, [place]  Negative    28. Elm mix  Negative    29. Hickory mix  Negative    30. Maple mix  Negative    31. Oak, [place] mix  Negative    32. Pecan Pollen  Negative    33. Pine mix  Negative    34. Sycamore Eastern  Negative    35. Sandy, Black Pollen  Negative    36. Alternaria alternata  Negative     37. Cladosporium Herbarum  Negative    38. Aspergillus mix  Negative    39. Penicillium mix  Negative    40. Bipolaris sorokiniana (Helminthosporium)  Negative    41. Drechslera spicifera (Curvularia)  Negative    42. Mucor plumbeus  Negative    43. Fusarium moniliforme  Negative    44. Aureobasidium pullulans (pullulara)  Negative    45. Rhizopus oryzae  Negative    46. Botrytis cinera  Negative    47. Epicoccum nigrum  Negative    48. Phoma betae  Negative    49. Candida Albicans  --   +/-   50. Trichophyton mentagrophytes  Negative    51. Mite, D Farinae  5,000 AU/ml  Negative    52. Mite, D Pteronyssinus  5,000 AU/ml  Negative    53. Cat Hair 10,000 BAU/ml  Negative    54.  Dog Epithelia  Negative    55. Mixed Feathers  Negative    56. Horse Epithelia  Negative    57. Cockroach, German  Negative    58. Mouse  Negative    59. Tobacco Leaf  Negative     Intradermal - 11/10/18 1011    Time Antigen Placed  1011    Allergen Manufacturer  Lavella Hammock    Location  Arm    Number of Test  15    Intradermal  Select    Control  Negative    [place]  1+    Johnson  1+    7 Grass  2+    Ragweed mix  1+    Weed mix  2+    Tree mix  Negative    Mold 1  2+    Mold 2  3+    Mold 3  3+    Mold 4  3+    Cat  3+    Dog  2+    Cockroach  1+    Mite mix  Negative     Food Adult Perc - 11/10/18 0946    Time Antigen Placed  2081    Allergen Manufacturer  Lavella Hammock    Location  Back    Number of allergen test  4    5. Milk, cow  Negative    7. Casein  Negative    8. Shellfish Mix  Negative    9. Fish Mix  Negative       Allergy testing results were read and interpreted by myself, documented by clinical staff.      Salvatore Marvel, MD  [facility]

## 2018-11-10 NOTE — Patient Instructions (Addendum)
1. Asthma-COPD overlap syndrome -Lung testing looks fairly stable. -We are not going to make any changes since you are doing fairly well. -I do want to get full pulmonary function testing again (we will order that today and they should call you by the end of the week) - Daily controller medication(s): Spiriva 2.18mcg one inhalation once daily and Symbicort 160/4.42mcg two puffs twice daily with spacer - Prior to physical activity: Ventolin 2 puffs 10-15 minutes before physical activity. - Rescue medications: Ventolin 4 puffs every 4-6 hours as needed - Asthma control goals:  * Full participation in all desired activities (may need albuterol before activity) * Albuterol use two time or less a week on average (not counting use with activity) * Cough interfering with sleep two time or less a month * Oral steroids no more than once a year * No hospitalizations  2. Chronic rhinitis - Testing today showed: grasses, ragweed, weeds, indoor molds, outdoor molds, cat, dog and cockroach - Copy of test results provided.  - Avoidance measures provided. - Continue with: Zyrtec (cetirizine) 10mg  tablet once daily and Flonase (fluticasone) two sprays per nostril daily - Start taking: Astelin (azelastine) 2 sprays per nostril 1-2 times daily as needed - You can use an extra dose of the antihistamine, if needed, for breakthrough symptoms.  - Consider nasal saline rinses 1-2 times daily to remove allergens from the nasal cavities as well as help with mucous clearance (this is especially helpful to do before the nasal sprays are given) - Consider allergy shots as a means of long-term control. - Allergy shots "re-train" and "reset" the immune system to ignore environmental allergens and decrease the resulting immune response to those allergens (sneezing, itchy watery eyes, runny nose, nasal congestion, etc).    - Allergy shots improve symptoms in 75-85% of patients.  - We can discuss more at the next appointment  if the medications are not working for you.  3. Return in about 3 months (around 02/10/2019).   Please inform us of any Emergency Department visits, hospitalizations, or changes in symptoms. Call us before going to the ED for breathing or allergy symptoms since we might be able to fit you in for a sick visit. Feel free to contact us anytime with any questions, problems, or concerns.  It was a pleasure to see you again today!  Websites that have reliable patient information: 1. American Academy of Asthma, Allergy, and Immunology: www.aaaai.org 2. Food Allergy Research and Education (FARE): foodallergy.org 3. Mothers of Asthmatics: http://www.asthmacommunitynetwork.org 4. American College of Allergy, Asthma, and Immunology: MonthlyElectricBill.co.uk   Make sure you are registered to vote! If you have moved or changed any of your contact information, you will need to get this updated before voting!   Reducing Pollen Exposure  The American Academy of Allergy, Asthma and Immunology suggests the following steps to reduce your exposure to pollen during allergy seasons.    1. Do not hang sheets or clothing out to dry; pollen may collect on these items. 2. Do not mow lawns or spend time around freshly cut grass; mowing stirs up pollen. 3. Keep windows closed at night.  Keep car windows closed while driving. 4. Minimize morning activities outdoors, a time when pollen counts are usually at their highest. 5. Stay indoors as much as possible when pollen counts or humidity is high and on windy days when pollen tends to remain in the air longer. 6. Use air conditioning when possible.  Many air conditioners have filters that trap  the pollen spores. 7. Use a HEPA room air filter to remove pollen form the indoor air you breathe.  Control of Mold Allergen   Mold and fungi can grow on a variety of surfaces provided certain temperature and moisture conditions exist.  Outdoor molds grow on plants, decaying vegetation  and soil.  The major outdoor mold, Alternaria and Cladosporium, are found in very high numbers during hot and dry conditions.  Generally, a late Summer - Fall peak is seen for common outdoor fungal spores.  Rain will temporarily lower outdoor mold spore count, but counts rise rapidly when the rainy period ends.  The most important indoor molds are Aspergillus and Penicillium.  Dark, humid and poorly ventilated basements are ideal sites for mold growth.  The next most common sites of mold growth are the bathroom and the kitchen.  Outdoor (Seasonal) Mold Control  Positive outdoor molds via skin testing: Alternaria, Cladosporium, Bipolaris (Helminthsporium), Drechslera (Curvalaria) and Mucor  1. Use air conditioning and keep windows closed 2. Avoid exposure to decaying vegetation. 3. Avoid leaf raking. 4. Avoid grain handling. 5. Consider wearing a face mask if working in moldy areas.  6.   Indoor (Perennial) Mold Control   Positive indoor molds via skin testing: Aspergillus, Penicillium, Fusarium, Aureobasidium (Pullulara) and Rhizopus  1. Maintain humidity below 50%. 2. Clean washable surfaces with 5% bleach solution. 3. Remove sources e.g. contaminated carpets.    Control of Cockroach Allergen  Cockroach allergen has been identified as an important cause of acute attacks of asthma, especially in urban settings.  There are fifty-five species of cockroach that exist in the [place], however only three, the [place], [place] species produce allergen that can affect patients with Asthma.  Allergens can be obtained from fecal particles, egg casings and secretions from cockroaches.    1. Remove food sources. 2. Reduce access to water. 3. Seal access and entry points. 4. Spray runways with 0.5-1% Diazinon or Chlorpyrifos 5. Blow boric acid power under stoves and refrigerator. 6. Place bait stations (hydramethylnon) at feeding sites.  Allergy Shots   Allergies are the  result of a chain reaction that starts in the immune system. Your immune system controls how your body defends itself. For instance, if you have an allergy to pollen, your immune system identifies pollen as an invader or allergen. Your immune system overreacts by producing antibodies called Immunoglobulin E (IgE). These antibodies travel to cells that release chemicals, causing an allergic reaction.  The concept behind allergy immunotherapy, whether it is received in the form of shots or tablets, is that the immune system can be desensitized to specific allergens that trigger allergy symptoms. Although it requires time and patience, the payback can be long-term relief.  How Do Allergy Shots Work?  Allergy shots work much like a vaccine. Your body responds to injected amounts of a particular allergen given in increasing doses, eventually developing a resistance and tolerance to it. Allergy shots can lead to decreased, minimal or no allergy symptoms.  There generally are two phases: build-up and maintenance. Build-up often ranges from three to six months and involves receiving injections with increasing amounts of the allergens. The shots are typically given once or twice a week, though more rapid build-up schedules are sometimes used.  The maintenance phase begins when the most effective dose is reached. This dose is different for each person, depending on how allergic you are and your response to the build-up injections. Once the maintenance dose is reached, there  are longer periods between injections, typically two to four weeks.  Occasionally doctors give cortisone-type shots that can temporarily reduce allergy symptoms. These types of shots are different and should not be confused with allergy immunotherapy shots.  Who Can Be Treated with Allergy Shots?  Allergy shots may be a good treatment approach for people with allergic rhinitis (hay fever), allergic asthma, conjunctivitis (eye allergy) or  stinging insect allergy.   Before deciding to begin allergy shots, you should consider:  . The length of allergy season and the severity of your symptoms . Whether medications and/or changes to your environment can control your symptoms . Your desire to avoid long-term medication use . Time: allergy immunotherapy requires a major time commitment . Cost: may vary depending on your insurance coverage  Allergy shots for children age 72 and older are effective and often well tolerated. They might prevent the onset of new allergen sensitivities or the progression to asthma.  Allergy shots are not started on patients who are pregnant but can be continued on patients who become pregnant while receiving them. In some patients with other medical conditions or who take certain common medications, allergy shots may be of risk. It is important to mention other medications you talk to your allergist.   When Will I Feel Better?  Some may experience decreased allergy symptoms during the build-up phase. For others, it may take as long as 12 months on the maintenance dose. If there is no improvement after a year of maintenance, your allergist will discuss other treatment options with you.  If you aren't responding to allergy shots, it may be because there is not enough dose of the allergen in your vaccine or there are missing allergens that were not identified during your allergy testing. Other reasons could be that there are high levels of the allergen in your environment or major exposure to non-allergic triggers like tobacco smoke.  What Is the Length of Treatment?  Once the maintenance dose is reached, allergy shots are generally continued for three to five years. The decision to stop should be discussed with your allergist at that time. Some people may experience a permanent reduction of allergy symptoms. Others may relapse and a longer course of allergy shots can be considered.  What Are the Possible  Reactions?  The two types of adverse reactions that can occur with allergy shots are local and systemic. Common local reactions include very mild redness and swelling at the injection site, which can happen immediately or several hours after. A systemic reaction, which is less common, affects the entire body or a particular body system. They are usually mild and typically respond quickly to medications. Signs include increased allergy symptoms such as sneezing, a stuffy nose or hives.  Rarely, a serious systemic reaction called anaphylaxis can develop. Symptoms include swelling in the throat, wheezing, a feeling of tightness in the chest, nausea or dizziness. Most serious systemic reactions develop within 30 minutes of allergy shots. This is why it is strongly recommended you wait in your doctor's office for 30 minutes after your injections. Your allergist is trained to watch for reactions, and his or her staff is trained and equipped with the proper medications to identify and treat them.  Who Should Administer Allergy Shots?  The preferred location for receiving shots is your prescribing allergist's office. Injections can sometimes be given at another facility where the physician and staff are trained to recognize and treat reactions, and have received instructions by your prescribing allergist.

## 2018-11-11 DIAGNOSIS — J301 Allergic rhinitis due to pollen: Secondary | ICD-10-CM | POA: Diagnosis not present

## 2018-11-11 NOTE — Progress Notes (Signed)
VIALS EXP 11-11-2019

## 2018-11-15 DIAGNOSIS — J3089 Other allergic rhinitis: Secondary | ICD-10-CM | POA: Diagnosis not present

## 2018-11-23 ENCOUNTER — Ambulatory Visit (HOSPITAL_COMMUNITY): Payer: Medicare Other

## 2018-11-24 ENCOUNTER — Ambulatory Visit (INDEPENDENT_AMBULATORY_CARE_PROVIDER_SITE_OTHER): Payer: Medicare Other | Admitting: *Deleted

## 2018-11-24 ENCOUNTER — Encounter (HOSPITAL_COMMUNITY): Payer: Medicare Other

## 2018-11-24 ENCOUNTER — Other Ambulatory Visit: Payer: Self-pay

## 2018-11-24 DIAGNOSIS — J309 Allergic rhinitis, unspecified: Secondary | ICD-10-CM

## 2018-11-24 NOTE — Progress Notes (Signed)
Immunotherapy   Patient Details  Name: Randy Hebert MRN: [id number] Date of Birth: 02/16/1966  11/24/2018  Patient started allergy injections.  Blue Vials, 1:100,000, Grass-Weed-Cat-Dog and Molds-CR with Expiration date of 11/11/2019 and patient received 0.05 ml of each vial. Following schedule: B  Frequency: 1-2 times a weeks with 48 hours in between injections Epi-Pen: Patient does have EpiPen and Emergency Action Plan and usage and plan reviewed. Consent signed and patient instructions given.  Patient waited 30 minutes after injection and no local or systemic reactions noted.   Maree Erie 11/24/2018, 11:15 AM

## 2018-11-30 ENCOUNTER — Ambulatory Visit (INDEPENDENT_AMBULATORY_CARE_PROVIDER_SITE_OTHER): Payer: Medicare Other | Admitting: *Deleted

## 2018-11-30 ENCOUNTER — Other Ambulatory Visit: Payer: Self-pay

## 2018-11-30 DIAGNOSIS — R55 Syncope and collapse: Secondary | ICD-10-CM | POA: Diagnosis not present

## 2018-12-01 ENCOUNTER — Ambulatory Visit (INDEPENDENT_AMBULATORY_CARE_PROVIDER_SITE_OTHER): Payer: Medicare Other

## 2018-12-01 DIAGNOSIS — G4733 Obstructive sleep apnea (adult) (pediatric): Secondary | ICD-10-CM | POA: Diagnosis not present

## 2018-12-01 DIAGNOSIS — J309 Allergic rhinitis, unspecified: Secondary | ICD-10-CM | POA: Diagnosis not present

## 2018-12-01 LAB — CUP PACEART REMOTE DEVICE CHECK
Date Time Interrogation Session: 20200401021120
Implantable Pulse Generator Implant Date: 20181029

## 2018-12-08 NOTE — Progress Notes (Signed)
[facility] Summary Report / Loop Recorder 

## 2018-12-13 DIAGNOSIS — F329 Major depressive disorder, single episode, unspecified: Secondary | ICD-10-CM | POA: Diagnosis not present

## 2018-12-14 ENCOUNTER — Telehealth: Payer: Self-pay | Admitting: Gastroenterology

## 2018-12-14 NOTE — Telephone Encounter (Signed)
Noted  

## 2018-12-14 NOTE — Telephone Encounter (Signed)
Received request from [facility] CT supervisor, Gaetana Michaelis, regarding new policy in the setting of COVID-19.  Requesting [place] to postpone CT until after January 31, 2019 if elective.  It is okay to postpone patient's CT till after January 31, 2019.  I will be let Anderson Malta know but wanted to make sure that you know in case there was a problem with prior authorization.

## 2018-12-15 ENCOUNTER — Ambulatory Visit (INDEPENDENT_AMBULATORY_CARE_PROVIDER_SITE_OTHER): Payer: Medicare Other | Admitting: *Deleted

## 2018-12-15 DIAGNOSIS — G5601 Carpal tunnel syndrome, right upper limb: Secondary | ICD-10-CM | POA: Diagnosis not present

## 2018-12-15 DIAGNOSIS — J309 Allergic rhinitis, unspecified: Secondary | ICD-10-CM | POA: Diagnosis not present

## 2018-12-15 DIAGNOSIS — I10 Essential (primary) hypertension: Secondary | ICD-10-CM | POA: Diagnosis not present

## 2018-12-15 DIAGNOSIS — G4733 Obstructive sleep apnea (adult) (pediatric): Secondary | ICD-10-CM | POA: Diagnosis not present

## 2018-12-20 DIAGNOSIS — I10 Essential (primary) hypertension: Secondary | ICD-10-CM | POA: Diagnosis not present

## 2018-12-20 DIAGNOSIS — E119 Type 2 diabetes mellitus without complications: Secondary | ICD-10-CM | POA: Diagnosis not present

## 2018-12-20 DIAGNOSIS — I2510 Atherosclerotic heart disease of native coronary artery without angina pectoris: Secondary | ICD-10-CM | POA: Diagnosis not present

## 2018-12-22 ENCOUNTER — Ambulatory Visit (INDEPENDENT_AMBULATORY_CARE_PROVIDER_SITE_OTHER): Payer: Medicare Other

## 2018-12-22 DIAGNOSIS — J309 Allergic rhinitis, unspecified: Secondary | ICD-10-CM

## 2018-12-24 ENCOUNTER — Ambulatory Visit (HOSPITAL_COMMUNITY): Payer: Medicare Other

## 2018-12-29 ENCOUNTER — Other Ambulatory Visit: Payer: Self-pay

## 2018-12-29 ENCOUNTER — Ambulatory Visit (INDEPENDENT_AMBULATORY_CARE_PROVIDER_SITE_OTHER): Payer: Medicare Other | Admitting: *Deleted

## 2018-12-29 DIAGNOSIS — J309 Allergic rhinitis, unspecified: Secondary | ICD-10-CM

## 2018-12-29 NOTE — Patient Outreach (Signed)
[facility]) Care Management  12/29/2018  MONTAE STAGER 10-11-1965 [id number]   Medication Adherence call to Mr. Karson Reede spoke with patient he is due on Metformin 1000 mg and Atorvastatin 40 mg patient said someone had already call from [facility] and had gone over all of his medications he has medication for about four month at this time patient is showing past due under [facility].   [facility] Management Direct Dial [phone]  Fax [phone] [email or web address]

## 2019-01-03 ENCOUNTER — Ambulatory Visit (INDEPENDENT_AMBULATORY_CARE_PROVIDER_SITE_OTHER): Payer: Medicare Other | Admitting: *Deleted

## 2019-01-03 ENCOUNTER — Other Ambulatory Visit: Payer: Self-pay

## 2019-01-03 DIAGNOSIS — R55 Syncope and collapse: Secondary | ICD-10-CM

## 2019-01-03 LAB — CUP PACEART REMOTE DEVICE CHECK
Date Time Interrogation Session: 20200504023927
Implantable Pulse Generator Implant Date: 20181029

## 2019-01-07 ENCOUNTER — Telehealth: Payer: Self-pay

## 2019-01-07 ENCOUNTER — Ambulatory Visit (INDEPENDENT_AMBULATORY_CARE_PROVIDER_SITE_OTHER): Payer: Medicare Other

## 2019-01-07 DIAGNOSIS — J309 Allergic rhinitis, unspecified: Secondary | ICD-10-CM | POA: Diagnosis not present

## 2019-01-07 NOTE — Telephone Encounter (Signed)
Pt agrees to virtual phone visit. Will have vitals.medication prior to appt,      Virtual Visit Pre-Appointment Phone Call  "(Name), I am calling you today to discuss your upcoming appointment. We are currently trying to limit exposure to the virus that causes COVID-19 by seeing patients at home rather than in the office."  1. "What is the BEST phone number to call the day of the visit?" - include this in appointment notes  2. "Do you have or have access to (through a family member/friend) a smartphone with video capability that we can use for your visit?" a. If yes - list this number in appt notes as "cell" (if different from BEST phone #) and list the appointment type as a VIDEO visit in appointment notes b. If no - list the appointment type as a PHONE visit in appointment notes  3. Confirm consent - "In the setting of the current Covid19 crisis, you are scheduled for a (phone or video) visit with your provider on (date) at (time).  Just as we do with many in-office visits, in order for you to participate in this visit, we must obtain consent.  If you'd like, I can send this to your mychart (if signed up) or email for you to review.  Otherwise, I can obtain your verbal consent now.  All virtual visits are billed to your insurance company just like a normal visit would be.  By agreeing to a virtual visit, we'd like you to understand that the technology does not allow for your provider to perform an examination, and thus may limit your provider's ability to fully assess your condition. If your provider identifies any concerns that need to be evaluated in person, we will make arrangements to do so.  Finally, though the technology is pretty good, we cannot assure that it will always work on either your or our end, and in the setting of a video visit, we may have to convert it to a phone-only visit.  In either situation, we cannot ensure that we have a secure connection.  Are you willing to proceed?"  STAFF: Did the patient verbally acknowledge consent to telehealth visit? Document YES/NO here: yes   4. Advise patient to be prepared - "Two hours prior to your appointment, go ahead and check your blood pressure, pulse, oxygen saturation, and your weight (if you have the equipment to check those) and write them all down. When your visit starts, your provider will ask you for this information. If you have an Apple Watch or Kardia device, please plan to have heart rate information ready on the day of your appointment. Please have a pen and paper handy nearby the day of the visit as well."  5. Give patient instructions for MyChart download to smartphone OR [email or web address].me as below if video visit (depending on what platform provider is using)  6. Inform patient they will receive a phone call 15 minutes prior to their appointment time (may be from unknown caller ID) so they should be prepared to answer    TELEPHONE CALL NOTE  ARREN LAMINACK has been deemed a candidate for a follow-up tele-health visit to limit community exposure during the Covid-19 pandemic. I spoke with the patient via phone to ensure availability of phone/video source, confirm preferred email & phone number, and discuss instructions and expectations.  I reminded Randy Hebert to be prepared with any vital sign and/or heart rhythm information that could potentially be obtained via home monitoring, at the  time of his visit. I reminded Randy Hebert to expect a phone call prior to his visit.  Drema Dallas, [place] 01/07/2019 11:19 AM   INSTRUCTIONS FOR DOWNLOADING THE MYCHART APP TO SMARTPHONE  - The patient must first make sure to have activated MyChart and know their login information - If Apple, go to [employer] and type in MyChart in the search bar and download the app. If Android, ask patient to go to [employer] and type in [place] in the search bar and download the app. The app is free but as with any other app  downloads, their phone may require them to verify saved payment information or Apple/Android password.  - The patient will need to then log into the app with their MyChart username and password, and select  as their healthcare provider to link the account. When it is time for your visit, go to the MyChart app, find appointments, and click Begin Video Visit. Be sure to Select Allow for your device to access the Microphone and Camera for your visit. You will then be connected, and your provider will be with you shortly.  **If they have any issues connecting, or need assistance please contact MyChart service desk [phone])**  **If using a computer, in order to ensure the best quality for their visit they will need to use either of the following Internet Browsers: [employer], or Google Chrome**  IF USING DOXIMITY or DOXY.ME - The patient will receive a link just prior to their visit by text.     FULL LENGTH CONSENT FOR TELE-HEALTH VISIT   I hereby voluntarily request, consent and authorize [facility] and its employed or contracted physicians, physician assistants, nurse practitioners or other licensed health care professionals (the Practitioner), to provide me with telemedicine health care services (the "Services") as deemed necessary by the treating Practitioner. I acknowledge and consent to receive the Services by the Practitioner via telemedicine. I understand that the telemedicine visit will involve communicating with the Practitioner through live audiovisual communication technology and the disclosure of certain medical information by electronic transmission. I acknowledge that I have been given the opportunity to request an in-person assessment or other available alternative prior to the telemedicine visit and am voluntarily participating in the telemedicine visit.  I understand that I have the right to withhold or withdraw my consent to the use of telemedicine  in the course of my care at any time, without affecting my right to future care or treatment, and that the Practitioner or I may terminate the telemedicine visit at any time. I understand that I have the right to inspect all information obtained and/or recorded in the course of the telemedicine visit and may receive copies of available information for a reasonable fee.  I understand that some of the potential risks of receiving the Services via telemedicine include:  Marland Kitchen Delay or interruption in medical evaluation due to technological equipment failure or disruption; . Information transmitted may not be sufficient (e.g. poor resolution of images) to allow for appropriate medical decision making by the Practitioner; and/or  . In rare instances, security protocols could fail, causing a breach of personal health information.  Furthermore, I acknowledge that it is my responsibility to provide information about my medical history, conditions and care that is complete and accurate to the best of my ability. I acknowledge that Practitioner's advice, recommendations, and/or decision may be based on factors not within their control, such as incomplete or inaccurate data  provided by me or distortions of diagnostic images or specimens that may result from electronic transmissions. I understand that the practice of medicine is not an exact science and that Practitioner makes no warranties or guarantees regarding treatment outcomes. I acknowledge that I will receive a copy of this consent concurrently upon execution via email to the email address I last provided but may also request a printed copy by calling the office of [facility].    I understand that my insurance will be billed for this visit.   I have read or had this consent read to me. . I understand the contents of this consent, which adequately explains the benefits and risks of the Services being provided via telemedicine.  . I have been provided ample  opportunity to ask questions regarding this consent and the Services and have had my questions answered to my satisfaction. . I give my informed consent for the services to be provided through the use of telemedicine in my medical care  By participating in this telemedicine visit I agree to the above.

## 2019-01-10 NOTE — Progress Notes (Signed)
[facility] Summary Report / Loop Recorder 

## 2019-01-14 ENCOUNTER — Ambulatory Visit (INDEPENDENT_AMBULATORY_CARE_PROVIDER_SITE_OTHER): Payer: Medicare Other

## 2019-01-14 ENCOUNTER — Telehealth (INDEPENDENT_AMBULATORY_CARE_PROVIDER_SITE_OTHER): Payer: Medicare Other | Admitting: Cardiology

## 2019-01-14 ENCOUNTER — Encounter: Payer: Self-pay | Admitting: Cardiology

## 2019-01-14 VITALS — BP 147/82 | HR 84 | Ht 65.0 in | Wt 210.0 lb

## 2019-01-14 DIAGNOSIS — I2510 Atherosclerotic heart disease of native coronary artery without angina pectoris: Secondary | ICD-10-CM | POA: Diagnosis not present

## 2019-01-14 DIAGNOSIS — J309 Allergic rhinitis, unspecified: Secondary | ICD-10-CM

## 2019-01-14 DIAGNOSIS — R55 Syncope and collapse: Secondary | ICD-10-CM

## 2019-01-14 DIAGNOSIS — E782 Mixed hyperlipidemia: Secondary | ICD-10-CM

## 2019-01-14 DIAGNOSIS — I10 Essential (primary) hypertension: Secondary | ICD-10-CM

## 2019-01-14 NOTE — Progress Notes (Signed)
Virtual Visit via Video Note   This visit type was conducted due to national recommendations for restrictions regarding the COVID-19 Pandemic (e.g. social distancing) in an effort to limit this patient's exposure and mitigate transmission in our community.  Due to his co-morbid illnesses, this patient is at least at moderate risk for complications without adequate follow up.  This format is felt to be most appropriate for this patient at this time.  All issues noted in this document were discussed and addressed.  A limited physical exam was performed with this format.  Please refer to the patient's chart for his consent to telehealth for [facility].   Date:  01/14/2019   ID:  Randy Hebert, DOB 02-12-1966, MRN [id number]  Patient Location: Home Provider Location: Home  PCP:  Rosita Fire, MD  Cardiologist:  Carlyle Dolly, MD  Electrophysiologist:  Cristopher Peru, MD   Evaluation Performed:  Follow-Up Visit  Chief Complaint:  4 month follow up  History of Present Illness:    Randy Hebert is a 53 y.o. male seen today for follow up of the following medical problems.   1. CAD - admit 01/2015 with lateral STEMI, received DES to D1. There was a 70% ramus and 40% mid RCA that was medically managed - 01/2015 echo LVEF 40%, lateral wall hypokinesis - no beta blocker due to history of sinus pause and bradycardia. No ACE-I given history of severe angioedema, no ARB due to risk of cross reactivity - admit 12/2015 with atypical chest pain, worst with palpatoin and movement. Had done some heavy lifting the day it started, negative workup for ACS. Since that time no recurrent symptoms   04/2017 PCI with stent.  -07/2018 nuclear stress prior infarct, no current ischemia  - no recent chest pain. Some SOB he attributes to allergies and COPD. Compliant with meds     2. SOB/COPD - 07/2015 PFTs: mild to mod ventilatory defect with small airway obstruction.  - 07/2015 CT PE no  PE, though suboptimal study - 07/2015 CXR no acute process.  PFTs with mild to mod vent defect, ABGs showed borderline resting hypoxemia.     3. Hyperlipidemia - endocrine lowered atorva from 80 to 40mg  due to elevated LFTs.   10/2018 TC 115 HDL 40 TG 152 LDL 52 - compliant with statin  4. Syncope - nocturnal bradycardia noted on previous monitors, no significant daytime episodes - has loop recorder, followed by EP -no beta blockers due to sinus pauses.  - no recent symptoms.  - 12/2018 loop check no significant events  5. HTN - home bp's 130s/70s.     6. OSA - comliant with  CPAP machine.     SH: within last few years his son overdosed,passed away     The patient does not have symptoms concerning for COVID-19 infection (fever, chills, cough, or new shortness of breath).    Past Medical History:  Diagnosis Date  . Allergy   . Anxiety   . Arthritis   . Asthma    hip replacement  . Back pain   . Bulging of cervical intervertebral disc   . CAD (coronary artery disease)    lateral STEMI 02/06/2015 00% D1 occlusion treated with Promus Premier 2.5 mm x 16 mm DES, 70% ramus stenosis, 40% mid RCA stenosis, 45% distal RCA stenosis, EF 45-50%  . CHF (congestive heart failure) ([place])   . COPD (chronic obstructive pulmonary disease) ([place])   . Depression   . Diabetes mellitus without  complication ([place])   . 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 Dry eye   . Dyspnea   . Early satiety 09/23/2016  . GERD (gastroesophageal reflux disease)   . Headache   . Heart murmur   . Hip pain   . Hyperlipidemia   . Hypertension   . MI (myocardial infarction) ([place])   . Myocardial infarction ([place])   . Neck pain   . Otitis media   . Pleurisy   . Sinus pause    9 sec sinus pause on telemetry after started on coreg after MI, avoid AV nodal blocking agent  . Sleep apnea   . 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 Unilateral  primary osteoarthritis, right hip 07/01/2016   Past Surgical History:  Procedure Laterality Date  . BIOPSY  10/09/2016   Procedure: BIOPSY;  Surgeon: Daneil Dolin, MD;  [facility];  Service: Endoscopy;;  . CARDIAC CATHETERIZATION N/A 02/06/2015   Procedure: Left Heart Cath and Coronary Angiography;  Surgeon: Leonie Man, MD;  Location: [place] [facility];  Service: Cardiovascular;  Laterality: N/A;  . CARDIAC CATHETERIZATION N/A 02/06/2015   Procedure: Coronary Stent Intervention;  Surgeon: Leonie Man, MD;  Location: [place] [facility];  Service: Cardiovascular;  Laterality: N/A;  . COLONOSCOPY WITH PROPOFOL N/A 10/09/2016   Procedure: COLONOSCOPY WITH PROPOFOL;  Surgeon: Daneil Dolin, MD;  [facility];  Service: Endoscopy;  Laterality: N/A;  12:00 pm  . COLONOSCOPY WITH PROPOFOL N/A 11/24/2016   Dr. Gala Romney: Multiple colon polyps removed, tubular adenoma.  Next colonoscopy in 5 years.  . CORONARY ANGIOPLASTY WITH STENT PLACEMENT  01/2015  . CORONARY STENT INTERVENTION N/A 04/27/2017   Procedure: CORONARY STENT INTERVENTION;  Surgeon: Nelva Bush, MD;  Location: [place] [facility];  Service: Cardiovascular;  Laterality: N/A;  . ELECTROPHYSIOLOGY STUDY N/A 06/29/2017   Procedure: ELECTROPHYSIOLOGY STUDY;  Surgeon: Evans Lance, MD;  Location: [place] [facility];  Service: Cardiovascular;  Laterality: N/A;  . ESOPHAGOGASTRODUODENOSCOPY (EGD) WITH PROPOFOL N/A 10/09/2016   Dr. Gala Romney: LA grade a esophagitis.  Barrett's esophagus, biopsy-proven.  Small hiatal hernia.  EGD February 2019  . INCISION / DRAINAGE HAND / FINGER    . LEFT HEART CATH AND CORONARY ANGIOGRAPHY N/A 04/27/2017   Procedure: LEFT HEART CATH AND CORONARY ANGIOGRAPHY;  Surgeon: Nelva Bush, MD;  Location: [place] [facility];  Service: Cardiovascular;  Laterality: N/A;  . LOOP RECORDER INSERTION  06/29/2017   Procedure: Loop Recorder Insertion;  Surgeon: Evans Lance, MD;  Location: [place] [facility];  Service: Cardiovascular;;  . POLYPECTOMY  11/24/2016   Procedure: POLYPECTOMY;  Surgeon: Daneil Dolin, MD;  [facility];  Service: Endoscopy;;  descending and sigmoid  . TOTAL HIP ARTHROPLASTY Right 07/01/2016  . TOTAL HIP ARTHROPLASTY Right 07/01/2016   Procedure: RIGHT TOTAL HIP ARTHROPLASTY ANTERIOR APPROACH;  Surgeon: Mcarthur Rossetti, MD;  Location: [place];  Service: Orthopedics;  Laterality: Right;     No outpatient medications have been marked as taking for the 01/14/19 encounter (Appointment) with Arnoldo Lenis, MD.     Allergies:   Carvedilol; Lisinopril; and Amoxicillin   Social History   Tobacco Use  . Smoking status: Current Every Day Smoker    Packs/day: 1.50    Years: 46.00    Pack years: 69.00    Types: Cigarettes  . Smokeless tobacco: Former Systems developer  Types: Chew  Substance Use Topics  . Alcohol use: Yes    Alcohol/week: 11.0 - 12.0 standard drinks    Types: 6 Cans of beer, 5 - 6 Shots of liquor per week    Comment: alcoholic, now drinks intermittently.  As of 11/08/18, patient reports that he may have a drink of whiskey to 3 times per week.  He denies ever being an alcoholic  . Drug use: No    Comment: history of drug use- marijuana, cocaine- quit 2014     Family Hx: The patient's family history includes Alzheimer's disease in his maternal grandmother; Arthritis in his father and mother; Cancer in his mother; Diabetes in his maternal uncle; Early death in his brother; Heart attack in his father; Heart disease in his father; Heart disease (age of onset: 35) in his brother; Stroke in his father.  ROS:   Please see the history of present illness.     All other systems reviewed and are negative.   Prior CV studies:   The following studies were reviewed today: 01/2015 cath 1. 1st Diag lesion, 100% stenosed. A Promus Premier 2.5 mm x 16 mm drug-eluting stent was placed. There is a 0% residual stenosis post intervention. 2.  Ramus lesion, 70% stenosed. 3. Mid RCA lesion, 40% stenosed. Dist RCA lesion, 45% stenosed. 4. Mild to moderately reduced LVEF with anterolateral and apical hypokinesis and elevated LVEDP  Abnormal anatomy with equal sized Diagonal and LAD Elleigh Cassetta but extensive RCA. Successful PCI of the First Diagonal Torien Ramroop.  Recommendations:  Standard post radial cath/PCI TR band removal  Dual independent therapy for minimum one year.  Check 2-D echocardiogram to better assess EF  Add statin and beta blocker. Smoking cessation counseling. Cardiac Rehabilitation And Case Management Consultation  If hemodynamic stable, would consider fast-track discharge  Would consider noninvasive evaluation of the Ramus Intermedius lesion in the absence of any recurrent anginal pain.  01/2015 echo Study Conclusions  - Left ventricle: Moderately severe hypokinesis of mid anterolateral segment, apical lateral segment, mid/apical anterior segments. EF is 40%. The cavity size was normal. Wall thickness was increased in a pattern of mild LVH. - Aortic valve: Sclerosis without stenosis. There was no significant regurgitation. - Right ventricle: The cavity size was normal. Systolic function was normal.  07/2015 CT PE IMPRESSION: 1. Suboptimal contrast bolus timing in the pulmonary arterial tree, poor in the lobar vessels other than the right lower lobe. No central pulmonary embolus. If there is continued suspicion of acute PE nuclear medicine V/Q scan may be most helpful. 2. Situs abnormality in the chest with right side aortic arch and descending aorta. Mirror image branching. Other visible situs including cardiac and upper abdominal situs appear normal. 3. Calcified Coronary artery atherosclerosis. Hepatomegaly with fatty liver disease.  07/2015 PFTs: mild to mod ventilatory defect with small airway obstruction, moderately reduced DLCO.   07/2018 nuclear stress  There was no ST segment  deviation noted during stress.  Defect 1: There is a medium defect of moderate severity present in the mid anterior, mid anterolateral and apical anterior location.  Findings consistent with prior myocardial infarction.  This is an intermediate risk study.  Nuclear stress EF: 53%.   Labs/Other Tests and Data Reviewed:    EKG:  No ECG reviewed.  Recent Labs: 08/04/2018: Hemoglobin 14.4; Platelets 307 11/03/2018: ALT 36; BUN 10; Creat 0.63; Potassium 4.5; Sodium 140; TSH 1.58   Recent Lipid Panel Lab Results  Component Value Date/Time   CHOL 115 11/03/2018 07:05  AM   TRIG 152 (H) 11/03/2018 07:05 AM   HDL 40 11/03/2018 07:05 AM   CHOLHDL 2.9 11/03/2018 07:05 AM   LDLCALC 52 11/03/2018 07:05 AM    Wt Readings from Last 3 Encounters:  11/09/18 213 lb (96.6 kg)  11/08/18 212 lb 9.6 oz (96.4 kg)  10/06/18 202 lb 13.2 oz (92 kg)     Objective:    Vital Signs:   Today's Vitals   01/14/19 0810  BP: (!) 147/82  Pulse: 84  Weight: 210 lb (95.3 kg)  Height: 5\' 5"  (1.651 m)   Body mass index is 34.95 kg/m.  Normal affect. Normal speech pattern and tone. Comfortable, no apparent distress. No audible signs of SOB or wheezing.    ASSESSMENT & PLAN:    1. CAD -recent nuclear stress without significant ischemia.  - no significant symptoms, continue medical therapy.    2. Hyperlipideima - at goal, continue statin   3. HTN -elevated this AM though overall home bp's are at goal, continue current meds  4. History of syncope/bradycardia - no recent issues, continue monitoring loop recorder   COVID-19 Education: The signs and symptoms of COVID-19 were discussed with the patient and how to seek care for testing (follow up with PCP or arrange E-visit).  The importance of social distancing was discussed today.  Time:   Today, I have spent 16 minutes with the patient with telehealth technology discussing the above problems.     Medication Adjustments/Labs and Tests  Ordered: Current medicines are reviewed at length with the patient today.  Concerns regarding medicines are outlined above.   Tests Ordered: No orders of the defined types were placed in this encounter.   Medication Changes: No orders of the defined types were placed in this encounter.   Disposition:  Follow up 6 months  Signed, Carlyle Dolly, MD  01/14/2019 7:53 AM    [facility]

## 2019-01-14 NOTE — Progress Notes (Signed)

## 2019-01-20 ENCOUNTER — Encounter: Payer: Self-pay | Admitting: Internal Medicine

## 2019-01-21 ENCOUNTER — Telehealth (INDEPENDENT_AMBULATORY_CARE_PROVIDER_SITE_OTHER): Payer: Medicare Other | Admitting: Internal Medicine

## 2019-01-21 ENCOUNTER — Ambulatory Visit (INDEPENDENT_AMBULATORY_CARE_PROVIDER_SITE_OTHER): Payer: Medicare Other

## 2019-01-21 VITALS — BP 147/81 | HR 85 | Ht 65.0 in | Wt 210.0 lb

## 2019-01-21 DIAGNOSIS — J309 Allergic rhinitis, unspecified: Secondary | ICD-10-CM

## 2019-01-21 DIAGNOSIS — Z720 Tobacco use: Secondary | ICD-10-CM

## 2019-01-21 DIAGNOSIS — R55 Syncope and collapse: Secondary | ICD-10-CM

## 2019-01-21 DIAGNOSIS — I2510 Atherosclerotic heart disease of native coronary artery without angina pectoris: Secondary | ICD-10-CM | POA: Diagnosis not present

## 2019-01-21 DIAGNOSIS — F17200 Nicotine dependence, unspecified, uncomplicated: Secondary | ICD-10-CM

## 2019-01-21 NOTE — Progress Notes (Signed)
Electrophysiology TeleHealth Note   Due to national recommendations of social distancing due to COVID 19, an audio/video telehealth visit is felt to be most appropriate for this patient at this time.  See MyChart message from today for the patient's consent to telehealth for [facility].   Date:  01/21/2019   ID:  Randy Hebert, DOB 1966/04/13, MRN [id number]  Location: patient's home  Provider location: [address], [place]  Evaluation Performed: Follow-up visit  PCP:  Rosita Fire, MD  Cardiologist:  Carlyle Dolly, MD  Electrophysiologist:  Dr Lovena Le  Chief Complaint:  "I have only passed out once."  History of Present Illness:    Randy Hebert is a 53 y.o. male who presents via audio/video conferencing for a telehealth visit today.He is a pleasant 53 yo man with a h/o syncope, s/p ILR insertion. He has done well in the interim with only a single episode of syncope. He denies chest pain or sob. Minimal edema.   Since last being seen in our clinic, the patient reports sadness over the loss of his son who died of a drug OD.The patient is otherwise without complaint today.  The patient denies symptoms of fevers, chills, cough, or new SOB worrisome for COVID 19.  Past Medical History:  Diagnosis Date  . Allergy   . Anxiety   . Arthritis   . Asthma    hip replacement  . Back pain   . Bulging of cervical intervertebral disc   . CAD (coronary artery disease)    lateral STEMI 02/06/2015 00% D1 occlusion treated with Promus Premier 2.5 mm x 16 mm DES, 70% ramus stenosis, 40% mid RCA stenosis, 45% distal RCA stenosis, EF 45-50%  . CHF (congestive heart failure) ([place])   . COPD (chronic obstructive pulmonary disease) ([place])   . Depression   . Diabetes mellitus without complication ([place])   . 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 Dry eye   . Dyspnea   . Early satiety 09/23/2016  . GERD (gastroesophageal reflux  disease)   . Headache   . Heart murmur   . Hip pain   . Hyperlipidemia   . Hypertension   . MI (myocardial infarction) ([place])   . Myocardial infarction ([place])   . Neck pain   . Otitis media   . Pleurisy   . Sinus pause    9 sec sinus pause on telemetry after started on coreg after MI, avoid AV nodal blocking agent  . Sleep apnea   . 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 Unilateral primary osteoarthritis, right hip 07/01/2016    Past Surgical History:  Procedure Laterality Date  . BIOPSY  10/09/2016   Procedure: BIOPSY;  Surgeon: Daneil Dolin, MD;  [facility];  Service: Endoscopy;;  . CARDIAC CATHETERIZATION N/A 02/06/2015   Procedure: Left Heart Cath and Coronary Angiography;  Surgeon: Leonie Man, MD;  Location: [place] [facility];  Service: Cardiovascular;  Laterality: N/A;  . CARDIAC CATHETERIZATION N/A 02/06/2015   Procedure: Coronary Stent Intervention;  Surgeon: Leonie Man, MD;  Location: [place] [facility];  Service: Cardiovascular;  Laterality: N/A;  . COLONOSCOPY WITH PROPOFOL N/A 10/09/2016   Procedure: COLONOSCOPY WITH PROPOFOL;  Surgeon: Daneil Dolin, MD;  [facility];  Service: Endoscopy;  Laterality: N/A;  12:00 pm  . COLONOSCOPY WITH PROPOFOL N/A 11/24/2016   Dr. Gala Romney: Multiple colon  polyps removed, tubular adenoma.  Next colonoscopy in 5 years.  . CORONARY ANGIOPLASTY WITH STENT PLACEMENT  01/2015  . CORONARY STENT INTERVENTION N/A 04/27/2017   Procedure: CORONARY STENT INTERVENTION;  Surgeon: Nelva Bush, MD;  Location: [place] [facility];  Service: Cardiovascular;  Laterality: N/A;  . ELECTROPHYSIOLOGY STUDY N/A 06/29/2017   Procedure: ELECTROPHYSIOLOGY STUDY;  Surgeon: Evans Lance, MD;  Location: [place] [facility];  Service: Cardiovascular;  Laterality: N/A;  . ESOPHAGOGASTRODUODENOSCOPY (EGD) WITH PROPOFOL N/A 10/09/2016   Dr. Gala Romney: LA grade a esophagitis.  Barrett's esophagus, biopsy-proven.  Small hiatal hernia.  EGD  February 2019  . INCISION / DRAINAGE HAND / FINGER    . LEFT HEART CATH AND CORONARY ANGIOGRAPHY N/A 04/27/2017   Procedure: LEFT HEART CATH AND CORONARY ANGIOGRAPHY;  Surgeon: Nelva Bush, MD;  Location: [place] [facility];  Service: Cardiovascular;  Laterality: N/A;  . LOOP RECORDER INSERTION  06/29/2017   Procedure: Loop Recorder Insertion;  Surgeon: Evans Lance, MD;  Location: [place] [facility];  Service: Cardiovascular;;  . POLYPECTOMY  11/24/2016   Procedure: POLYPECTOMY;  Surgeon: Daneil Dolin, MD;  [facility];  Service: Endoscopy;;  descending and sigmoid  . TOTAL HIP ARTHROPLASTY Right 07/01/2016  . TOTAL HIP ARTHROPLASTY Right 07/01/2016   Procedure: RIGHT TOTAL HIP ARTHROPLASTY ANTERIOR APPROACH;  Surgeon: Mcarthur Rossetti, MD;  Location: [place];  Service: Orthopedics;  Laterality: Right;    Current Outpatient Medications  Medication Sig Dispense Refill  . acetaminophen (TYLENOL) 325 MG tablet Take 650 mg by mouth every 6 (six) hours as needed for mild pain.    Marland Kitchen albuterol (PROVENTIL) (2.5 MG/3ML) 0.083% nebulizer solution Take 3 mLs (2.5 mg total) by nebulization every 6 (six) hours as needed for wheezing or shortness of breath. 150 mL 1  . amLODipine (NORVASC) 10 MG tablet Take 1 tablet (10 mg total) by mouth daily. 30 tablet 6  . aspirin 81 MG EC tablet Take 1 tablet (81 mg total) by mouth daily. 30 tablet   . atorvastatin (LIPITOR) 40 MG tablet TAKE 1 TABLET BY MOUTH ONCE A DAY. 30 tablet 0  . azelastine (ASTELIN) 0.1 % nasal spray Place 2 sprays into both nostrils 2 (two) times daily. 30 mL 5  . budesonide-formoterol (SYMBICORT) 160-4.5 MCG/ACT inhaler Inhale 2 puffs into the lungs 2 (two) times daily. 1 Inhaler 5  . Carboxymeth-Glycerin-Polysorb (REFRESH OPTIVE ADVANCED) 0.5-1-0.5 % SOLN Apply 1 drop to eye 2 (two) times daily as needed (dry eyes).     . cetirizine (ZYRTEC) 10 MG tablet Take 10 mg by mouth daily.    . Cholecalciferol (VITAMIN D3)  125 MCG (5000 UT) CAPS Take 1 capsule (5,000 Units total) by mouth daily. 90 capsule 0  . citalopram (CELEXA) 20 MG tablet Take 20 mg by mouth daily.    . cycloSPORINE (RESTASIS) 0.05 % ophthalmic emulsion Place 1 drop into both eyes 2 (two) times daily.    . ferrous sulfate 325 (65 FE) MG tablet Take 325 mg by mouth daily with breakfast.    . furosemide (LASIX) 40 MG tablet TAKE 1 TABLET BY MOUTH ONCE DAILY. 30 tablet 6  . glucose blood (IGLUCOSE TEST STRIPS) test strip 1 each by Other route 2 (two) times daily. Use as instructed 100 each 12  . Magnesium 200 MG TABS Take 1 tablet (200 mg total) by mouth daily. 30 each 6  . metFORMIN (GLUCOPHAGE) 1000 MG tablet Take 1 tablet (1,000 mg total) by  mouth 2 (two) times daily with a meal. 180 tablet 3  . mometasone (ELOCON) 0.1 % cream Apply 1 application topically daily.     . naproxen (NAPROSYN) 500 MG tablet Take 500 mg by mouth as needed.    . nitroGLYCERIN (NITROSTAT) 0.4 MG SL tablet Place 1 tablet (0.4 mg total) under the tongue every 5 (five) minutes as needed for chest pain. 25 tablet 0  . pantoprazole (PROTONIX) 40 MG tablet TAKE 1 TABLET BY MOUTH ONCE A DAY. 90 tablet 3  . potassium chloride SA (K-DUR,KLOR-CON) 20 MEQ tablet TAKE 1 TABLET BY MOUTH DAILY. 30 tablet 0  . PROVENTIL HFA 108 (90 Base) MCG/ACT inhaler INHALE 2 PUFFS INTO THE LUNGS EVERY 6 HOURS AS NEEDED FOR SHORTNESS OF BREATH OR WHEEZING. 18 g 1  . Tiotropium Bromide Monohydrate (SPIRIVA RESPIMAT) 2.5 MCG/ACT AERS Inhale 1 puff into the lungs daily. 1 Inhaler 5  . PAZEO 0.7 % SOLN      No current facility-administered medications for this visit.     Allergies:   Carvedilol; Lisinopril; and Amoxicillin   Social History:  The patient  reports that he has been smoking cigarettes. He has a 69.00 pack-year smoking history. He has quit using smokeless tobacco.  His smokeless tobacco use included chew. He reports current alcohol use of about 11.0 - 12.0 standard drinks of alcohol per  week. He reports that he does not use drugs.   Family History:  The patient's  family history includes Alzheimer's disease in his maternal grandmother; Arthritis in his father and mother; Cancer in his mother; Diabetes in his maternal uncle; Early death in his brother; Heart attack in his father; Heart disease in his father; Heart disease (age of onset: 34) in his brother; Stroke in his father.   ROS:  Please see the history of present illness.   All other systems are personally reviewed and negative.    Exam:    Vital Signs:  BP (!) 147/81   Pulse 85   Ht 5\' 5"  (1.651 m)   Wt 210 lb (95.3 kg)   SpO2 95%   BMI 34.95 kg/m   Well appearing, alert and conversant, regular work of breathing,  good skin color Eyes- anicteric, neuro- grossly intact, skin- no apparent rash or lesions or cyanosis, mouth- oral mucosa is pink   Labs/Other Tests and Data Reviewed:    Recent Labs: 08/04/2018: Hemoglobin 14.4; Platelets 307 11/03/2018: ALT 36; BUN 10; Creat 0.63; Potassium 4.5; Sodium 140; TSH 1.58   Wt Readings from Last 3 Encounters:  01/20/19 210 lb (95.3 kg)  01/14/19 210 lb (95.3 kg)  11/09/18 213 lb (96.6 kg)     Other studies personally reviewed:   Last device remote is reviewed from [facility] PDF dated 01/03/19 which reveals normal device function, no arrhythmias    ASSESSMENT & PLAN:    1.  Syncope - he has had only a single episode which was typical for autonomic dysfunction. We discussed ways to prevent passing out. 2. CAD - he denies anginal symptoms. 3. Tobacco abuse - he is still smoking but has been under stress to stop. 4. Obesity - he has been encouraged to continue to lose weight. 5. COVID 19 screen The patient denies symptoms of COVID 19 at this time.  The importance of social distancing was discussed today.  Follow-up:  6 months Next remote: 1 month  Current medicines are reviewed at length with the patient today.   The patient does not have concerns  regarding his  medicines.  The following changes were made today:  none  Labs/ tests ordered today include: none No orders of the defined types were placed in this encounter.    Patient Risk:  after full review of this patients clinical status, I feel that they are at moderate risk at this time.  Today, I have spent 25 minutes with the patient with telehealth technology discussing all of the above .    Signed, Cristopher Peru, MD  01/21/2019 1:28 PM     [place] [address] [phone] (office) [phone] (fax)

## 2019-01-25 ENCOUNTER — Telehealth: Payer: Self-pay | Admitting: Internal Medicine

## 2019-01-25 NOTE — Telephone Encounter (Signed)
LMOVM for pt to return call. LNQ device / remote transmission scheduled 02/04/2019

## 2019-01-25 NOTE — Patient Instructions (Signed)
Medication Instructions: Your physician recommends that you continue on your current medications as directed. Please refer to the Current Medication list given to you today.   Labwork: None today  Procedures/Testing: None today  Follow-Up: 6 months with Dr.Taylor  1 month for remote check  Any Additional Special Instructions Will Be Listed Below (If Applicable).     If you need a refill on your cardiac medications before your next appointment, please call your pharmacy.     Thank you for choosing [facility] !

## 2019-01-25 NOTE — Telephone Encounter (Signed)
Pt is needing a 1 month remote check

## 2019-01-26 ENCOUNTER — Ambulatory Visit (INDEPENDENT_AMBULATORY_CARE_PROVIDER_SITE_OTHER): Payer: Medicare Other

## 2019-01-26 DIAGNOSIS — J309 Allergic rhinitis, unspecified: Secondary | ICD-10-CM

## 2019-01-28 NOTE — Telephone Encounter (Signed)
LMOVM (DPR) advising that LINQ transmits automatically, next monthly summary report is scheduled for 02/04/19. [place] phone number for any questions or concerns.

## 2019-02-02 ENCOUNTER — Ambulatory Visit (HOSPITAL_COMMUNITY): Payer: Medicare Other

## 2019-02-04 ENCOUNTER — Ambulatory Visit (INDEPENDENT_AMBULATORY_CARE_PROVIDER_SITE_OTHER): Payer: Medicare Other | Admitting: *Deleted

## 2019-02-04 ENCOUNTER — Ambulatory Visit (INDEPENDENT_AMBULATORY_CARE_PROVIDER_SITE_OTHER): Payer: Medicare Other

## 2019-02-04 DIAGNOSIS — J309 Allergic rhinitis, unspecified: Secondary | ICD-10-CM | POA: Diagnosis not present

## 2019-02-04 DIAGNOSIS — R55 Syncope and collapse: Secondary | ICD-10-CM | POA: Diagnosis not present

## 2019-02-06 LAB — CUP PACEART REMOTE DEVICE CHECK
Date Time Interrogation Session: 20200606033700
Implantable Pulse Generator Implant Date: 20181029

## 2019-02-09 ENCOUNTER — Ambulatory Visit (INDEPENDENT_AMBULATORY_CARE_PROVIDER_SITE_OTHER): Payer: Medicare Other | Admitting: *Deleted

## 2019-02-09 ENCOUNTER — Other Ambulatory Visit: Payer: Self-pay

## 2019-02-09 DIAGNOSIS — J309 Allergic rhinitis, unspecified: Secondary | ICD-10-CM

## 2019-02-09 NOTE — Patient Outreach (Signed)
[place] [facility] [place] [facility]) Care Management  02/09/2019  TAJAY MUZZY 11/28/1965 [id number]   Medication Adherence call to Mr. Jadavion Spo[facility] [place] Identifiers Verify spoke with patient he is due on Atorvastatin 40 mg he explain pharmacy was double filling it and end up with more and will be done in two weeks and will order it then. Mr. Heffern is showing past due under [place].   [place] Management Direct Dial [phone]  Fax [phone] [email or web address]

## 2019-02-11 NOTE — Progress Notes (Signed)
[facility] Summary Report / Loop Recorder 

## 2019-02-12 ENCOUNTER — Other Ambulatory Visit: Payer: Self-pay | Admitting: "Endocrinology

## 2019-02-16 ENCOUNTER — Ambulatory Visit (INDEPENDENT_AMBULATORY_CARE_PROVIDER_SITE_OTHER): Payer: Medicare Other | Admitting: *Deleted

## 2019-02-16 DIAGNOSIS — J309 Allergic rhinitis, unspecified: Secondary | ICD-10-CM

## 2019-02-23 ENCOUNTER — Ambulatory Visit (INDEPENDENT_AMBULATORY_CARE_PROVIDER_SITE_OTHER): Payer: Medicare Other

## 2019-02-23 DIAGNOSIS — J309 Allergic rhinitis, unspecified: Secondary | ICD-10-CM

## 2019-03-02 ENCOUNTER — Ambulatory Visit (INDEPENDENT_AMBULATORY_CARE_PROVIDER_SITE_OTHER): Payer: Medicare Other

## 2019-03-02 DIAGNOSIS — J309 Allergic rhinitis, unspecified: Secondary | ICD-10-CM | POA: Diagnosis not present

## 2019-03-03 ENCOUNTER — Emergency Department (HOSPITAL_COMMUNITY): Payer: Medicare Other

## 2019-03-03 ENCOUNTER — Encounter (HOSPITAL_COMMUNITY): Payer: Self-pay | Admitting: Emergency Medicine

## 2019-03-03 ENCOUNTER — Other Ambulatory Visit: Payer: Self-pay

## 2019-03-03 ENCOUNTER — Emergency Department (HOSPITAL_COMMUNITY)
Admission: EM | Admit: 2019-03-03 | Discharge: 2019-03-03 | Disposition: A | Discharge status: Home / Self Care | Payer: Medicare Other | Attending: Emergency Medicine | Admitting: Emergency Medicine

## 2019-03-03 DIAGNOSIS — Z7982 Long term (current) use of aspirin: Secondary | ICD-10-CM | POA: Diagnosis not present

## 2019-03-03 DIAGNOSIS — Z7984 Long term (current) use of oral hypoglycemic drugs: Secondary | ICD-10-CM | POA: Insufficient documentation

## 2019-03-03 DIAGNOSIS — I2510 Atherosclerotic heart disease of native coronary artery without angina pectoris: Secondary | ICD-10-CM | POA: Diagnosis not present

## 2019-03-03 DIAGNOSIS — J45909 Unspecified asthma, uncomplicated: Secondary | ICD-10-CM | POA: Diagnosis not present

## 2019-03-03 DIAGNOSIS — Z96641 Presence of right artificial hip joint: Secondary | ICD-10-CM | POA: Insufficient documentation

## 2019-03-03 DIAGNOSIS — Z955 Presence of coronary angioplasty implant and graft: Secondary | ICD-10-CM | POA: Diagnosis not present

## 2019-03-03 DIAGNOSIS — E119 Type 2 diabetes mellitus without complications: Secondary | ICD-10-CM | POA: Insufficient documentation

## 2019-03-03 DIAGNOSIS — M62830 Muscle spasm of back: Secondary | ICD-10-CM | POA: Diagnosis not present

## 2019-03-03 DIAGNOSIS — F17210 Nicotine dependence, cigarettes, uncomplicated: Secondary | ICD-10-CM | POA: Insufficient documentation

## 2019-03-03 DIAGNOSIS — J449 Chronic obstructive pulmonary disease, unspecified: Secondary | ICD-10-CM | POA: Diagnosis not present

## 2019-03-03 DIAGNOSIS — M7918 Myalgia, other site: Secondary | ICD-10-CM | POA: Diagnosis present

## 2019-03-03 DIAGNOSIS — M549 Dorsalgia, unspecified: Secondary | ICD-10-CM | POA: Diagnosis not present

## 2019-03-03 DIAGNOSIS — Z79899 Other long term (current) drug therapy: Secondary | ICD-10-CM | POA: Diagnosis not present

## 2019-03-03 DIAGNOSIS — I252 Old myocardial infarction: Secondary | ICD-10-CM | POA: Insufficient documentation

## 2019-03-03 DIAGNOSIS — I5042 Chronic combined systolic (congestive) and diastolic (congestive) heart failure: Secondary | ICD-10-CM | POA: Insufficient documentation

## 2019-03-03 DIAGNOSIS — I110 Hypertensive heart disease with heart failure: Secondary | ICD-10-CM | POA: Insufficient documentation

## 2019-03-03 MED ORDER — TIZANIDINE HCL 2 MG PO TABS: 2.0000 mg | ORAL_TABLET | Freq: Four times a day (QID) | ORAL | 0 refills | Status: DC | PRN

## 2019-03-03 MED ORDER — MELOXICAM 15 MG PO TABS: 15.0000 mg | ORAL_TABLET | Freq: Every day | ORAL | 0 refills | Status: DC

## 2019-03-03 NOTE — ED Notes (Signed)
Went in to d/c patient and patient was not found in room. Unable to go over discharge instructions or do vitals.

## 2019-03-03 NOTE — Discharge Instructions (Signed)
SEEK IMMEDIATE MEDICAL ATTENTION IF: New numbness, tingling, weakness, or problem with the use of your arms or legs.  Severe back pain not relieved with medications.  Change in bowel or bladder control.  Increasing pain in any areas of the body (such as chest or abdominal pain).  Shortness of breath, dizziness or fainting.  Nausea (feeling sick to your stomach), vomiting, fever, or sweats.  

## 2019-03-03 NOTE — ED Triage Notes (Signed)
Patient complaining of back pain x 2-3 weeks. States "I've had pneumonia before but my breathing is ok right now." Admits to drinking a pint of liquor prior to arrival to ER.

## 2019-03-03 NOTE — ED Provider Notes (Signed)
[facility] EMERGENCY DEPARTMENT Provider Note   CSN: [id number] Arrival date & time: 03/03/19  1528     History   Chief Complaint Chief Complaint  Patient presents with  . Back Pain    HPI Randy Hebert is a 53 y.o. male who presents with cc of mid back pain. He c/o pain between his shoulder blades which has been intermittent for the past several weeks. It is worse with cough8ing and sneezing. It is also worse with twisting. Today the pain became constant. He rates the pain at 5/10 Better with rest and ben gay.  He denies chest pain, SOB, pain with exertion. Ripping or tearing pain.       HPI  Past Medical History:  Diagnosis Date  . Allergy   . Anxiety   . Arthritis   . Asthma    hip replacement  . Back pain   . Bulging of cervical intervertebral disc   . CAD (coronary artery disease)    lateral STEMI 02/06/2015 00% D1 occlusion treated with Promus Premier 2.5 mm x 16 mm DES, 70% ramus stenosis, 40% mid RCA stenosis, 45% distal RCA stenosis, EF 45-50%  . CHF (congestive heart failure) ([place])   . COPD (chronic obstructive pulmonary disease) ([place])   . Depression   . Diabetes mellitus without complication ([place])   . 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 Dry eye   . Dyspnea   . Early satiety 09/23/2016  . GERD (gastroesophageal reflux disease)   . Headache   . Heart murmur   . Hip pain   . Hyperlipidemia   . Hypertension   . MI (myocardial infarction) ([place])   . Myocardial infarction ([place])   . Neck pain   . Otitis media   . Pleurisy   . Sinus pause    9 sec sinus pause on telemetry after started on coreg after MI, avoid AV nodal blocking agent  . Sleep apnea   . 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 Unilateral primary osteoarthritis, right hip 07/01/2016    Patient Active Problem List   Diagnosis Date Noted  . Seasonal and perennial allergic rhinitis 11/10/2018  . Restrictive lung disease 11/10/2018  . Fatty liver  11/08/2018  . Rectal bleeding 11/08/2018  . LLQ pain 08/04/2018  . Diarrhea 08/04/2018  . Melena 08/04/2018  . Barrett's esophagus 08/04/2018  . Transaminitis 08/04/2018  . Weight gain 06/30/2018  . Vitamin D deficiency 06/30/2018  . Alcohol abuse 06/30/2018  . DM type 2 causing vascular disease ([place]) 08/17/2017  . Syncope 06/29/2017  . HCAP (healthcare-associated pneumonia) 05/15/2017  . Uncontrolled type 2 diabetes mellitus with hyperglycemia, without long-term current use of insulin ([place]) 05/15/2017  . Acute on chronic respiratory failure with hypoxia ([place]) 05/15/2017  . Lobar pneumonia ([place]) 05/15/2017  . Chronic combined systolic and diastolic CHF (congestive heart failure) ([place]) 05/15/2017  . Sinus pause 05/15/2017  . Syncope and collapse 05/15/2017  . Bradycardia 04/28/2017  . Sleep apnea 04/28/2017  . Non-ST elevation (NSTEMI) myocardial infarction ([place]) 04/27/2017  . NSTEMI (non-ST elevated myocardial infarction) ([place]) 04/26/2017  . Substance abuse ([place]) 04/15/2017  . Depression with anxiety 04/15/2017  . GERD (gastroesophageal reflux disease) 09/23/2016  . 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 Asthma-COPD overlap syndrome ([place]) 08/27/2015  . Cigarette nicotine dependence with nicotine-induced disorder 08/27/2015  . Mixed hyperlipidemia 08/27/2015  . Class 2 severe obesity due to excess calories  with serious comorbidity and body mass index (BMI) of 35.0 to 35.9 in adult [facility]) 08/27/2015  . Acute on chronic systolic heart failure ([place]) 06/02/2015  . ACE inhibitor-aggravated angioedema   . Chronic systolic CHF (congestive heart failure) ([place]) 06/01/2015  . CAD (coronary artery disease) 05/31/2015  . STEMI (ST elevation myocardial infarction) ([place]) 05/31/2015  . Current smoker 05/31/2015  . Essential hypertension, benign 02/09/2015    Past Surgical History:  Procedure Laterality Date  . BIOPSY  10/09/2016   Procedure: BIOPSY;  Surgeon: Daneil Dolin,  MD;  [facility];  Service: Endoscopy;;  . CARDIAC CATHETERIZATION N/A 02/06/2015   Procedure: Left Heart Cath and Coronary Angiography;  Surgeon: Leonie Man, MD;  Location: [place] CV LAB;  Service: Cardiovascular;  Laterality: N/A;  . CARDIAC CATHETERIZATION N/A 02/06/2015   Procedure: Coronary Stent Intervention;  Surgeon: Leonie Man, MD;  Location: [place] CV LAB;  Service: Cardiovascular;  Laterality: N/A;  . COLONOSCOPY WITH PROPOFOL N/A 10/09/2016   Procedure: COLONOSCOPY WITH PROPOFOL;  Surgeon: Daneil Dolin, MD;  [facility];  Service: Endoscopy;  Laterality: N/A;  12:00 pm  . COLONOSCOPY WITH PROPOFOL N/A 11/24/2016   Dr. Gala Romney: Multiple colon polyps removed, tubular adenoma.  Next colonoscopy in 5 years.  . CORONARY ANGIOPLASTY WITH STENT PLACEMENT  01/2015  . CORONARY STENT INTERVENTION N/A 04/27/2017   Procedure: CORONARY STENT INTERVENTION;  Surgeon: Nelva Bush, MD;  Location: [place] CV LAB;  Service: Cardiovascular;  Laterality: N/A;  . ELECTROPHYSIOLOGY STUDY N/A 06/29/2017   Procedure: ELECTROPHYSIOLOGY STUDY;  Surgeon: Evans Lance, MD;  Location: [place] CV LAB;  Service: Cardiovascular;  Laterality: N/A;  . ESOPHAGOGASTRODUODENOSCOPY (EGD) WITH PROPOFOL N/A 10/09/2016   Dr. Gala Romney: LA grade a esophagitis.  Barrett's esophagus, biopsy-proven.  Small hiatal hernia.  EGD February 2019  . INCISION / DRAINAGE HAND / FINGER    . LEFT HEART CATH AND CORONARY ANGIOGRAPHY N/A 04/27/2017   Procedure: LEFT HEART CATH AND CORONARY ANGIOGRAPHY;  Surgeon: Nelva Bush, MD;  Location: [place] CV LAB;  Service: Cardiovascular;  Laterality: N/A;  . LOOP RECORDER INSERTION  06/29/2017   Procedure: Loop Recorder Insertion;  Surgeon: Evans Lance, MD;  Location: [place] CV LAB;  Service: Cardiovascular;;  . POLYPECTOMY  11/24/2016   Procedure: POLYPECTOMY;  Surgeon: Daneil Dolin, MD;  [facility];  Service:  Endoscopy;;  descending and sigmoid  . TOTAL HIP ARTHROPLASTY Right 07/01/2016  . TOTAL HIP ARTHROPLASTY Right 07/01/2016   Procedure: RIGHT TOTAL HIP ARTHROPLASTY ANTERIOR APPROACH;  Surgeon: Mcarthur Rossetti, MD;  Location: [place];  Service: Orthopedics;  Laterality: Right;        Home Medications    Prior to Admission medications   Medication Sig Start Date End Date Taking? Authorizing Provider  acetaminophen (TYLENOL) 325 MG tablet Take 650 mg by mouth every 6 (six) hours as needed for mild pain.    [provider]  albuterol (PROVENTIL) (2.5 MG/3ML) 0.083% nebulizer solution Take 3 mLs (2.5 mg total) by nebulization every 6 (six) hours as needed for wheezing or shortness of breath. 05/27/16   Soyla Dryer, PA-C  amLODipine (NORVASC) 10 MG tablet TAKE 1 TABLET BY MOUTH ONCE A DAY. 02/14/19   Cassandria Anger, MD  aspirin 81 MG EC tablet Take 1 tablet (81 mg total) by mouth daily. 04/29/17   Cheryln Manly, NP  atorvastatin (LIPITOR) 40 MG tablet TAKE 1 TABLET BY MOUTH  ONCE A DAY. 02/14/19   Cassandria Anger, MD  azelastine (ASTELIN) 0.1 % nasal spray Place 2 sprays into both nostrils 2 (two) times daily. 11/10/18   Valentina Shaggy, MD  budesonide-formoterol [facility]) 160-4.5 MCG/ACT inhaler Inhale 2 puffs into the lungs 2 (two) times daily. 07/14/18   Valentina Shaggy, MD  Carboxymeth-Glycerin-Polysorb (REFRESH OPTIVE ADVANCED) 0.5-1-0.5 % SOLN Apply 1 drop to eye 2 (two) times daily as needed (dry eyes).     [provider]  cetirizine (ZYRTEC) 10 MG tablet Take 10 mg by mouth daily.    [provider]  Cholecalciferol (VITAMIN D3) 125 MCG (5000 UT) CAPS Take 1 capsule (5,000 Units total) by mouth daily. 11/09/18   Cassandria Anger, MD  citalopram (CELEXA) 20 MG tablet Take 20 mg by mouth daily. 11/06/17   [provider]  cycloSPORINE (RESTASIS) 0.05 % ophthalmic emulsion Place 1 drop into both eyes 2 (two) times  daily.    [provider]  ferrous sulfate 325 (65 FE) MG tablet Take 325 mg by mouth daily with breakfast.    [provider]  furosemide (LASIX) 40 MG tablet TAKE 1 TABLET BY MOUTH ONCE DAILY. 05/04/18   Evans Lance, MD  glucose blood (IGLUCOSE TEST STRIPS) test strip 1 each by Other route 2 (two) times daily. Use as instructed 07/31/17   Raylene Everts, MD  Magnesium 200 MG TABS Take 1 tablet (200 mg total) by mouth daily. 03/06/17   Lendon Colonel, NP  metFORMIN (GLUCOPHAGE) 1000 MG tablet Take 1 tablet (1,000 mg total) by mouth 2 (two) times daily with a meal. 04/15/17   Raylene Everts, MD  mometasone (ELOCON) 0.1 % cream Apply 1 application topically daily.  10/21/18   [provider]  naproxen (NAPROSYN) 500 MG tablet Take 500 mg by mouth as needed.    [provider]  nitroGLYCERIN (NITROSTAT) 0.4 MG SL tablet Place 1 tablet (0.4 mg total) under the tongue every 5 (five) minutes as needed for chest pain. 10/21/17   Raylene Everts, MD  pantoprazole (PROTONIX) 40 MG tablet TAKE 1 TABLET BY MOUTH ONCE A DAY. 05/04/18   Carlis Stable, NP  PAZEO 0.7 % SOLN  11/19/18   [provider]  potassium chloride SA (K-DUR,KLOR-CON) 20 MEQ tablet TAKE 1 TABLET BY MOUTH DAILY. 12/17/17   Raylene Everts, MD  PROVENTIL HFA [phone] Base) MCG/ACT inhaler INHALE 2 PUFFS INTO THE LUNGS EVERY 6 HOURS AS NEEDED FOR SHORTNESS OF BREATH OR WHEEZING. 08/13/17   Raylene Everts, MD  Tiotropium Bromide Monohydrate (SPIRIVA RESPIMAT) 2.5 MCG/ACT AERS Inhale 1 puff into the lungs daily. 07/14/18 01/20/19  Valentina Shaggy, MD    Family History Family History  Problem Relation Age of Onset  . Heart attack Father   . Stroke Father   . Arthritis Father   . Heart disease Father   . Cancer Mother        ???  . Arthritis Mother   . Heart disease Brother 21       died in sleep  . Early death Brother   . Diabetes Maternal Uncle   . Alzheimer's disease  Maternal Grandmother     Social History Social History   Tobacco Use  . Smoking status: Current Every Day Smoker    Packs/day: 1.50    Years: 46.00    Pack years: 69.00    Types: Cigarettes  . Smokeless tobacco: Former Systems developer  Types: Chew  Substance Use Topics  . Alcohol use: Yes    Alcohol/week: 11.0 - 12.0 standard drinks    Types: 6 Cans of beer, 5 - 6 Shots of liquor per week    Comment: alcoholic, now drinks intermittently.  As of 11/08/18, patient reports that he may have a drink of whiskey to 3 times per week.  He denies ever being an alcoholic  . Drug use: No    Comment: history of drug use- marijuana, cocaine- quit 2014     Allergies   Carvedilol, Lisinopril, and Amoxicillin   Review of Systems Review of Systems  Ten systems reviewed and are negative for acute change, except as noted in the HPI.   Physical Exam Updated Vital Signs BP 137/82 (BP Location: Right Arm)   Pulse 87   Temp 97.8 F (36.6 C) (Oral)   Ht 5\' 5"  (1.651 m)   Wt 95.3 kg   SpO2 97%   BMI 34.95 kg/m   Physical Exam Vitals signs and nursing note reviewed.  Constitutional:      General: He is not in acute distress.    Appearance: He is well-developed. He is not diaphoretic.  HENT:     Head: Normocephalic and atraumatic.  Eyes:     General: No scleral icterus.    Conjunctiva/sclera: Conjunctivae normal.  Neck:     Musculoskeletal: Normal range of motion and neck supple.  Cardiovascular:     Rate and Rhythm: Normal rate and regular rhythm.     Heart sounds: Normal heart sounds.  Pulmonary:     Effort: Pulmonary effort is normal. No respiratory distress.     Breath sounds: Normal breath sounds.  Abdominal:     Palpations: Abdomen is soft.     Tenderness: There is no abdominal tenderness.  Musculoskeletal:     Thoracic back: He exhibits tenderness and spasm.       Back:  Skin:    General: Skin is warm and dry.  Neurological:     Mental Status: He is alert.  Psychiatric:         Behavior: Behavior normal.      ED Treatments / Results  Labs (all labs ordered are listed, but only abnormal results are displayed) Labs Reviewed - No data to display  EKG None  Radiology Dg Chest 2 View  Result Date: 03/03/2019 CLINICAL DATA:  Upper back pain. EXAM: CHEST - 2 VIEW COMPARISON:  Radiographs of October 03, 2018. FINDINGS: The heart size and mediastinal contours are within normal limits. Both lungs are clear. No pneumothorax or pleural effusion is noted. The visualized skeletal structures are unremarkable. IMPRESSION: No active cardiopulmonary disease. Electronically Signed   By: Marijo Conception M.D.   On: 03/03/2019 16:14    Procedures Procedures (including critical care time)  Medications Ordered in ED Medications - No data to display   Initial Impression / Assessment and Plan / ED Course  I have reviewed the triage vital signs and the nursing notes.  Pertinent labs & imaging results that were available during my care of the patient were reviewed by me and considered in my medical decision making (see chart for details).        Patient with reproducible musculoskeletal upper back pain.  I doubt any other emergent cause such as thoracic aortic dissection, PE or other form of acute coronary syndrome.  He has normal vital signs,.  His pain is reproducible and worse with movement.  Patient will be discharged with  Robaxin and Mobic.  Follow-up with PCP.  Discussed return precautions.  Final Clinical Impressions(s) / ED Diagnoses   Final diagnoses:  None    ED Discharge Orders    None       Margarita Mail, PA-C 03/03/19 1805    Daleen Bo, MD 03/03/19 2330

## 2019-03-09 ENCOUNTER — Other Ambulatory Visit: Payer: Self-pay

## 2019-03-09 ENCOUNTER — Telehealth: Payer: Self-pay | Admitting: Gastroenterology

## 2019-03-09 ENCOUNTER — Ambulatory Visit (HOSPITAL_COMMUNITY)
Admission: RE | Admit: 2019-03-09 | Discharge: 2019-03-09 | Disposition: A | Discharge status: Home / Self Care | Payer: Medicare Other | Source: Ambulatory Visit | Attending: Gastroenterology | Admitting: Gastroenterology

## 2019-03-09 ENCOUNTER — Ambulatory Visit (INDEPENDENT_AMBULATORY_CARE_PROVIDER_SITE_OTHER): Payer: Medicare Other | Admitting: *Deleted

## 2019-03-09 DIAGNOSIS — R55 Syncope and collapse: Secondary | ICD-10-CM

## 2019-03-09 DIAGNOSIS — R945 Abnormal results of liver function studies: Secondary | ICD-10-CM | POA: Diagnosis not present

## 2019-03-09 DIAGNOSIS — R7989 Other specified abnormal findings of blood chemistry: Secondary | ICD-10-CM

## 2019-03-09 DIAGNOSIS — R160 Hepatomegaly, not elsewhere classified: Secondary | ICD-10-CM | POA: Insufficient documentation

## 2019-03-09 DIAGNOSIS — K5730 Diverticulosis of large intestine without perforation or abscess without bleeding: Secondary | ICD-10-CM | POA: Diagnosis not present

## 2019-03-09 LAB — POCT I-STAT CREATININE: Creatinine, Ser: 0.6 mg/dL — ABNORMAL LOW (ref 0.61–1.24)

## 2019-03-09 MED ORDER — IOHEXOL 300 MG/ML  SOLN
100.0000 mL | Freq: Once | INTRAMUSCULAR | Status: AC | PRN
  Administered 2019-03-09: 100 mL via INTRAVENOUS

## 2019-03-09 NOTE — Telephone Encounter (Signed)
Opened in error

## 2019-03-10 LAB — CUP PACEART REMOTE DEVICE CHECK
Date Time Interrogation Session: 20200709101008
Implantable Pulse Generator Implant Date: 20181029

## 2019-03-11 ENCOUNTER — Other Ambulatory Visit: Payer: Self-pay

## 2019-03-11 ENCOUNTER — Ambulatory Visit (INDEPENDENT_AMBULATORY_CARE_PROVIDER_SITE_OTHER): Payer: Medicare Other

## 2019-03-11 DIAGNOSIS — E559 Vitamin D deficiency, unspecified: Secondary | ICD-10-CM | POA: Diagnosis not present

## 2019-03-11 DIAGNOSIS — J309 Allergic rhinitis, unspecified: Secondary | ICD-10-CM | POA: Diagnosis not present

## 2019-03-11 DIAGNOSIS — E1159 Type 2 diabetes mellitus with other circulatory complications: Secondary | ICD-10-CM | POA: Diagnosis not present

## 2019-03-12 LAB — COMPLETE METABOLIC PANEL WITH GFR
AG Ratio: 1.6 (calc) (ref 1.0–2.5)
ALT: 42 U/L (ref 9–46)
AST: 37 U/L — ABNORMAL HIGH (ref 10–35)
Albumin: 4.3 g/dL (ref 3.6–5.1)
Alkaline phosphatase (APISO): 107 U/L (ref 35–144)
BUN/Creatinine Ratio: 8 (calc) (ref 6–22)
BUN: 6 mg/dL — ABNORMAL LOW (ref 7–25)
CO2: 27 mmol/L (ref 20–32)
CREATININE: 0.77 mg/dL (ref 0.70–1.33)
Calcium: 10 mg/dL (ref 8.6–10.3)
Chloride: 99 mmol/L (ref 98–110)
GFR, Est African American: 119 mL/min/{1.73_m2} (ref >=60)
GFR, Est Non African American: 103 mL/min/{1.73_m2} (ref >=60)
Globulin: 2.7 g/dL (calc) (ref 1.9–3.7)
Glucose, Bld: 158 mg/dL — ABNORMAL HIGH (ref 65–99)
Potassium: 4.4 mmol/L (ref 3.5–5.3)
Sodium: 138 mmol/L (ref 135–146)
Total Bilirubin: 1 mg/dL (ref 0.2–1.2)
Total Protein: 7 g/dL (ref 6.1–8.1)

## 2019-03-12 LAB — HEMOGLOBIN A1C
Hgb A1c MFr Bld: 7.3 % of total Hgb — ABNORMAL HIGH (ref <5.7)
Mean Plasma Glucose: 163 (calc)
eAG (mmol/L): 9 (calc)

## 2019-03-12 LAB — VITAMIN D 25 HYDROXY (VIT D DEFICIENCY, FRACTURES): Vit D, 25-Hydroxy: 21 ng/mL — ABNORMAL LOW (ref 30–100)

## 2019-03-16 ENCOUNTER — Ambulatory Visit (INDEPENDENT_AMBULATORY_CARE_PROVIDER_SITE_OTHER): Payer: Medicare Other | Admitting: "Endocrinology

## 2019-03-16 ENCOUNTER — Other Ambulatory Visit: Payer: Self-pay

## 2019-03-16 ENCOUNTER — Encounter: Payer: Self-pay | Admitting: "Endocrinology

## 2019-03-16 DIAGNOSIS — I10 Essential (primary) hypertension: Secondary | ICD-10-CM | POA: Diagnosis not present

## 2019-03-16 DIAGNOSIS — E782 Mixed hyperlipidemia: Secondary | ICD-10-CM | POA: Diagnosis not present

## 2019-03-16 DIAGNOSIS — E1159 Type 2 diabetes mellitus with other circulatory complications: Secondary | ICD-10-CM | POA: Diagnosis not present

## 2019-03-16 NOTE — Progress Notes (Signed)
03/16/2019, 8:27 AM                                Endocrinology Telehealth Visit Follow up Note -During COVID -19 Pandemic  I connected with Randy Hebert on 03/16/2019   by telephone and verified that I am speaking with the correct person using two identifiers. Randy Hebert, [id number]. he has verbally consented to this visit. All issues noted in this document were discussed and addressed. The format was not optimal for physical exam   Subjective:    Patient ID: Randy Hebert, male    DOB: [birth year].  Randy Hebert is being engaged in telehealth via form follow-up for management of currently uncontrolled symptomatic type 2 diabetes, hyperlipidemia, hypertension. PMD:  Rosita Fire, MD.   Past Medical History:  Diagnosis Date  . Allergy   . Anxiety   . Arthritis   . Asthma    hip replacement  . Back pain   . Bulging of cervical intervertebral disc   . CAD (coronary artery disease)    lateral STEMI 02/06/2015 00% D1 occlusion treated with Promus Premier 2.5 mm x 16 mm DES, 70% ramus stenosis, 40% mid RCA stenosis, 45% distal RCA stenosis, EF 45-50%  . CHF (congestive heart failure) ([place])   . COPD (chronic obstructive pulmonary disease) ([place])   . Depression   . Diabetes mellitus without complication ([place])   . 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 Dry eye   . Dyspnea   . Early satiety 09/23/2016  . GERD (gastroesophageal reflux disease)   . Headache   . Heart murmur   . Hip pain   . Hyperlipidemia   . Hypertension   . MI (myocardial infarction) ([place])   . Myocardial infarction ([place])   . Neck pain   . Otitis media   . Pleurisy   . Sinus pause    9 sec sinus pause on telemetry after started on coreg after MI, avoid AV nodal blocking agent  . Sleep apnea   . 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 Unilateral primary osteoarthritis, right hip 07/01/2016   Past Surgical History:  Procedure  Laterality Date  . BIOPSY  10/09/2016   Procedure: BIOPSY;  Surgeon: Daneil Dolin, MD;  [facility];  Service: Endoscopy;;  . CARDIAC CATHETERIZATION N/A 02/06/2015   Procedure: Left Heart Cath and Coronary Angiography;  Surgeon: Leonie Man, MD;  Location: [facility];  Service: Cardiovascular;  Laterality: N/A;  . CARDIAC CATHETERIZATION N/A 02/06/2015   Procedure: Coronary Stent Intervention;  Surgeon: Leonie Man, MD;  Location: [facility];  Service: Cardiovascular;  Laterality: N/A;  . COLONOSCOPY WITH PROPOFOL N/A 10/09/2016   Procedure: COLONOSCOPY WITH PROPOFOL;  Surgeon: Daneil Dolin, MD;  [facility];  Service: Endoscopy;  Laterality: N/A;  12:00 pm  . COLONOSCOPY WITH PROPOFOL N/A 11/24/2016   Dr. Gala Romney: Multiple colon polyps removed, tubular adenoma.  Next colonoscopy in 5 years.  . CORONARY ANGIOPLASTY WITH STENT PLACEMENT  01/2015  . CORONARY STENT INTERVENTION N/A 04/27/2017   Procedure: CORONARY STENT INTERVENTION;  Surgeon: Nelva Bush, MD;  Location: [facility];  Service: Cardiovascular;  Laterality: N/A;  . ELECTROPHYSIOLOGY STUDY N/A 06/29/2017   Procedure: ELECTROPHYSIOLOGY STUDY;  Surgeon: Evans Lance, MD;  Location:  [facility];  Service: Cardiovascular;  Laterality: N/A;  . ESOPHAGOGASTRODUODENOSCOPY (EGD) WITH PROPOFOL N/A 10/09/2016   Dr. Gala Romney: LA grade a esophagitis.  Barrett's esophagus, biopsy-proven.  Small hiatal hernia.  EGD February 2019  . INCISION / DRAINAGE HAND / FINGER    . LEFT HEART CATH AND CORONARY ANGIOGRAPHY N/A 04/27/2017   Procedure: LEFT HEART CATH AND CORONARY ANGIOGRAPHY;  Surgeon: Nelva Bush, MD;  Location: [facility];  Service: Cardiovascular;  Laterality: N/A;  . LOOP RECORDER INSERTION  06/29/2017   Procedure: Loop Recorder Insertion;  Surgeon: Evans Lance, MD;  Location: [facility];  Service: Cardiovascular;;  . POLYPECTOMY  11/24/2016   Procedure:  POLYPECTOMY;  Surgeon: Daneil Dolin, MD;  [facility];  Service: Endoscopy;;  descending and sigmoid  . TOTAL HIP ARTHROPLASTY Right 07/01/2016  . TOTAL HIP ARTHROPLASTY Right 07/01/2016   Procedure: RIGHT TOTAL HIP ARTHROPLASTY ANTERIOR APPROACH;  Surgeon: Mcarthur Rossetti, MD;  Location: [facility];  Service: Orthopedics;  Laterality: Right;   Social History   Socioeconomic History  . Marital status: Significant Other    Spouse name: Randy Hebert  . Number of children: 3  . Years of education: 77  . Highest education level: Not on file  Occupational History  . Occupation: unemployed    Comment: Nature conservation officer    Comment: disability pending  Social Needs  . Financial resource strain: Not on file  . Food insecurity    Worry: Not on file    Inability: Not on file  . Transportation needs    Medical: Not on file    Non-medical: Not on file  Tobacco Use  . Smoking status: Current Every Day Smoker    Packs/day: 1.50    Years: 46.00    Pack years: 69.00    Types: Cigarettes  . Smokeless tobacco: Former Systems developer    Types: Chew  Substance and Sexual Activity  . Alcohol use: Yes    Alcohol/week: 11.0 - 12.0 standard drinks    Types: 6 Cans of beer, 5 - 6 Shots of liquor per week    Comment: alcoholic, now drinks intermittently.  As of 11/08/18, patient reports that he may have a drink of whiskey to 3 times per week.  He denies ever being an alcoholic  . Drug use: No    Comment: history of drug use- marijuana, cocaine- quit 2014  . Sexual activity: Yes    Partners: Female    Birth control/protection: None  Lifestyle  . Physical activity    Days per week: Not on file    Minutes per session: Not on file  . Stress: Not on file  Relationships  . Social Herbalist on phone: Not on file    Gets together: Not on file    Attends religious service: Not on file    Active member of club or organization: Not on file    Attends meetings of clubs or organizations: Not on  file    Relationship status: Not on file  Other Topics Concern  . Not on file  Social History Narrative   Lives with girlfriend Randy Hebert   Leisure - TV   Lost his son 07-03-2018 due to overdose   Outpatient Encounter Medications as of 03/16/2019  Medication Sig  . acetaminophen (TYLENOL) 325 MG tablet Take 650 mg by mouth every 6 (six) hours as needed for mild pain.  Marland Kitchen albuterol (PROVENTIL) (2.5 MG/3ML) 0.083% nebulizer solution Take  3 mLs (2.5 mg total) by nebulization every 6 (six) hours as needed for wheezing or shortness of breath.  Marland Kitchen amLODipine (NORVASC) 10 MG tablet TAKE 1 TABLET BY MOUTH ONCE A DAY.  Marland Kitchen aspirin 81 MG EC tablet Take 1 tablet (81 mg total) by mouth daily.  Marland Kitchen atorvastatin (LIPITOR) 40 MG tablet TAKE 1 TABLET BY MOUTH ONCE A DAY.  Marland Kitchen azelastine (ASTELIN) 0.1 % nasal spray Place 2 sprays into both nostrils 2 (two) times daily.  . budesonide-formoterol (SYMBICORT) 160-4.5 MCG/ACT inhaler Inhale 2 puffs into the lungs 2 (two) times daily.  . Carboxymeth-Glycerin-Polysorb (REFRESH OPTIVE ADVANCED) 0.5-1-0.5 % SOLN Apply 1 drop to eye 2 (two) times daily as needed (dry eyes).   . cetirizine (ZYRTEC) 10 MG tablet Take 10 mg by mouth daily.  . Cholecalciferol (VITAMIN D3) 125 MCG (5000 UT) CAPS Take 1 capsule (5,000 Units total) by mouth daily.  . citalopram (CELEXA) 20 MG tablet Take 20 mg by mouth daily.  . cycloSPORINE (RESTASIS) 0.05 % ophthalmic emulsion Place 1 drop into both eyes 2 (two) times daily.  . ferrous sulfate 325 (65 FE) MG tablet Take 325 mg by mouth daily with breakfast.  . furosemide (LASIX) 40 MG tablet TAKE 1 TABLET BY MOUTH ONCE DAILY.  Marland Kitchen glucose blood (IGLUCOSE TEST STRIPS) test strip 1 each by Other route 2 (two) times daily. Use as instructed  . Magnesium 200 MG TABS Take 1 tablet (200 mg total) by mouth daily.  . meloxicam (MOBIC) 15 MG tablet Take 1 tablet (15 mg total) by mouth daily. Take 1 daily with food.  . metFORMIN (GLUCOPHAGE) 1000 MG tablet Take  1 tablet (1,000 mg total) by mouth 2 (two) times daily with a meal.  . mometasone (ELOCON) 0.1 % cream Apply 1 application topically daily.   . naproxen (NAPROSYN) 500 MG tablet Take 500 mg by mouth as needed.  . nitroGLYCERIN (NITROSTAT) 0.4 MG SL tablet Place 1 tablet (0.4 mg total) under the tongue every 5 (five) minutes as needed for chest pain.  . pantoprazole (PROTONIX) 40 MG tablet TAKE 1 TABLET BY MOUTH ONCE A DAY.  Marland Kitchen PAZEO 0.7 % SOLN   . potassium chloride SA (K-DUR,KLOR-CON) 20 MEQ tablet TAKE 1 TABLET BY MOUTH DAILY.  Marland Kitchen PROVENTIL HFA 108 (90 Base) MCG/ACT inhaler INHALE 2 PUFFS INTO THE LUNGS EVERY 6 HOURS AS NEEDED FOR SHORTNESS OF BREATH OR WHEEZING.  . Tiotropium Bromide Monohydrate (SPIRIVA RESPIMAT) 2.5 MCG/ACT AERS Inhale 1 puff into the lungs daily.  Marland Kitchen tiZANidine (ZANAFLEX) 2 MG tablet Take 1 tablet (2 mg total) by mouth every 6 (six) hours as needed for muscle spasms.   No facility-administered encounter medications on file as of 03/16/2019.     ALLERGIES: Allergies  Allergen Reactions  . Carvedilol Other (See Comments)    Sinus pause on telemetry >3 seconds. Longest one 9 sec. No AV nodal agent  . Lisinopril Anaphylaxis, Shortness Of Breath and Swelling    Angioedema, required intubation and mechanical ventilation  . Amoxicillin Nausea And Vomiting    Nausea only - no allergy    VACCINATION STATUS: Immunization History  Administered Date(s) Administered  . Influenza,inj,Quad PF,6+ Mos 07/03/2016, 05/07/2017  . Pneumococcal Conjugate-13 06/17/2017  . Pneumococcal Polysaccharide-23 02/07/2015    Diabetes He presents for his follow-up diabetic visit. He has type 2 diabetes mellitus. Onset time: He was diagnosed at approximate age of 68 years. His disease course has been worsening. There are no hypoglycemic associated symptoms. Pertinent negatives for  hypoglycemia include no confusion, headaches, pallor or seizures. Pertinent negatives for diabetes include no chest  pain, no fatigue, no polydipsia, no polyphagia, no polyuria and no weakness. There are no hypoglycemic complications. Symptoms are worsening. Diabetic complications include heart disease. Risk factors for coronary artery disease include dyslipidemia, diabetes mellitus, hypertension, male sex, family history, obesity, sedentary lifestyle and tobacco exposure. Current diabetic treatment includes oral agent (monotherapy). He is compliant with treatment most of the time. He is following a generally unhealthy diet. When asked about meal planning, he reported none. He has had a previous visit with a dietitian. He never participates in exercise. An ACE inhibitor/angiotensin II receptor blocker is contraindicated (He has documented allergy for ACE inhibitor's). He does not see a podiatrist.Eye exam is not current.  Hyperlipidemia This is a chronic problem. The current episode started more than 1 year ago. Exacerbating diseases include diabetes and obesity. Pertinent negatives include no chest pain, myalgias or shortness of breath. Current antihyperlipidemic treatment includes statins. Risk factors for coronary artery disease include dyslipidemia, diabetes mellitus, family history, obesity, male sex, hypertension and a sedentary lifestyle.  Hypertension This is a chronic problem. The current episode started more than 1 year ago. Pertinent negatives include no chest pain, headaches, neck pain, palpitations or shortness of breath. Risk factors for coronary artery disease include diabetes mellitus, dyslipidemia, obesity, sedentary lifestyle and smoking/tobacco exposure. Past treatments include calcium channel blockers. Hypertensive end-organ damage includes CAD/MI.    Review of Systems  Constitutional: Negative for chills, fatigue, fever and unexpected weight change.  HENT: Negative for dental problem, mouth sores and trouble swallowing.   Eyes: Negative for visual disturbance.  Respiratory: Negative for cough,  choking, chest tightness, shortness of breath and wheezing.   Cardiovascular: Negative for chest pain, palpitations and leg swelling.  Gastrointestinal: Negative for abdominal distention, abdominal pain, constipation, diarrhea, nausea and vomiting.  Endocrine: Negative for polydipsia, polyphagia and polyuria.  Genitourinary: Negative for dysuria, flank pain, hematuria and urgency.  Musculoskeletal: Negative for back pain, gait problem, myalgias and neck pain.  Skin: Negative for pallor, rash and wound.  Neurological: Negative for seizures, syncope, weakness, numbness and headaches.  Psychiatric/Behavioral: Negative.  Negative for confusion and dysphoric mood.    Objective:    There were no vitals taken for this visit.  Wt Readings from Last 3 Encounters:  03/03/19 210 lb (95.3 kg)  01/20/19 210 lb (95.3 kg)  01/14/19 210 lb (95.3 kg)     Physical Exam  Constitutional: He is oriented to person, place, and time. He appears well-developed. He is cooperative. No distress.  HENT:  Head: Normocephalic and atraumatic.  Eyes: EOM are normal.  Neck: Normal range of motion. Neck supple. No tracheal deviation present. No thyromegaly present.  Cardiovascular: Normal rate, S1 normal and S2 normal. Exam reveals no gallop.  No murmur heard. Pulses:      Dorsalis pedis pulses are 1+ on the right side and 1+ on the left side.       Posterior tibial pulses are 1+ on the right side and 1+ on the left side.  Pulmonary/Chest: Effort normal. No respiratory distress. He has no wheezes.  Abdominal: He exhibits no distension. There is no abdominal tenderness. There is no guarding and no CVA tenderness.  Musculoskeletal:        General: No edema.     Right shoulder: He exhibits no swelling and no deformity.  Neurological: He is alert and oriented to person, place, and time. He has normal strength. No  cranial nerve deficit or sensory deficit. Gait normal.  Skin: Skin is warm and dry. No rash noted. No  cyanosis. Nails show no clubbing.  Psychiatric: He has a normal mood and affect. His speech is normal. Cognition and memory are normal.    CMP     Component Value Date/Time   NA 138 03/11/2019 0716   K 4.4 03/11/2019 0716   CL 99 03/11/2019 0716   CO2 27 03/11/2019 0716   GLUCOSE 158 (H) 03/11/2019 0716   BUN 6 (L) 03/11/2019 0716   CREATININE 0.77 03/11/2019 0716   CALCIUM 10.0 03/11/2019 0716   PROT 7.0 03/11/2019 0716   ALBUMIN 3.7 04/15/2017 0927   AST 37 (H) 03/11/2019 0716   ALT 42 03/11/2019 0716   ALKPHOS 98 04/15/2017 0927   BILITOT 1.0 03/11/2019 0716   GFRNONAA 103 03/11/2019 0716   GFRAA 119 03/11/2019 0716    Diabetic Labs (most recent): Lab Results  Component Value Date   HGBA1C 7.3 (H) 03/11/2019   HGBA1C 6.2 (H) 11/03/2018   HGBA1C 6.7 (H) 06/23/2018     Lipid Panel ( most recent) Lipid Panel     Component Value Date/Time   CHOL 115 11/03/2018 0705   TRIG 152 (H) 11/03/2018 0705   HDL 40 11/03/2018 0705   CHOLHDL 2.9 11/03/2018 0705   VLDL 41 (H) 04/27/2017 0505   LDLCALC 52 11/03/2018 0705      Lab Results  Component Value Date   TSH 1.58 11/03/2018   TSH 1.79 11/17/2017   TSH 1.635 04/26/2017   TSH 2.564 06/27/2015   FREET4 1.5 11/03/2018   FREET4 1.4 11/17/2017      Assessment & Plan:   1. DM type 2 causing vascular disease ([place])  - Randy Hebert has currently uncontrolled symptomatic type 2 DM since  53 years of age. -He is not reported symptoms of hypoglycemia nor hyperglycemia.  His A1c is 7.3% increasing from 6.3% during his last visit.   -his diabetes is complicated by  coronary artery disease which required stent placement, obesity/sedentary life, chronic heavy smoking, and CADEL STAIRS remains at a high risk for more acute and chronic complications which include CAD, CVA, CKD, retinopathy, and neuropathy. These are all discussed in detail with the patient.  - I have counseled him on diet management and weight loss, by  adopting a carbohydrate restricted/protein rich diet. -Still admits to dietary indiscretions including consumption of sweets and sweetened beverages.  - he  admits there is a room for improvement in his diet and drink choices. -  Suggestion is made for him to avoid simple carbohydrates  from his diet including Cakes, Sweet Desserts / Pastries, Ice Cream, Soda (diet and regular), Sweet Tea, Candies, Chips, Cookies, Sweet Pastries,  Store Bought Juices, Alcohol in Excess of  1-2 drinks a day, Artificial Sweeteners, Coffee Creamer, and "Sugar-free" Products. This will help patient to have stable blood glucose profile and potentially avoid unintended weight gain.  - I encouraged him to switch to  unprocessed or minimally processed complex starch and increased protein intake (animal or plant source), fruits, and vegetables.  - he is advised to stick to a routine mealtimes to eat 3 meals  a day and avoid unnecessary snacks ( to snack only to correct hypoglycemia).    - I have approached him with the following individualized plan to manage diabetes and patient agrees:   -He has maintained reasonable control of his diabetes without insulin.  Emphasizing the need for  him to stay engaged, given his history of coronary artery disease, he is advised to continue metformin 1000 mg p.o. twice daily-daily after breakfast and supper.    - Patient specific target  A1c;  LDL, HDL, Triglycerides, and  Waist Circumference were discussed in detail.  2) BP/HTN: he is advised to home monitor blood pressure and report if > 140/90 on 2 separate readings.   He remains chronic heavy smoker. He has a documented allergy of ACE inhibitors anaphylaxis .  He is advised to continue amlodipine 10 mg p.o. daily, with as needed diuretics.  He is extensively counseled for smoking cessation.     3) Lipids/HPL: Recent lipid panel showed controlled LDL at 46, triglycerides still high at 215.  He is advised to avoid butter and fried  food.  Due to elevated liver enzymes (which are mainly due to his alcohol abuse) I advised him to lower his atorvastatin to 40 mg from 80 mg.  Patient continues to use alcohol heavily, could explain the transaminitis.  His most recent labs show improvement in his liver enzymes.  He is advised to consider weaning of alcohol off.  4)  Weight/Diet: CDE Consult has been initiated and in progress,  exercise, and detailed carbohydrates information provided.  5) Chronic Care/Health Maintenance:  -he  is on Statin medications and  is encouraged to continue to follow up with Ophthalmology, Dentist,  Podiatrist at least yearly or according to recommendations, and advised to  Quit smoking ( is a chronic heavy smoker for the last 40 days), and consider weaning off of alcohol.  I have recommended yearly flu vaccine and pneumonia vaccination at least every 5 years; moderate intensity exercise for up to 150 minutes weekly; and  sleep for at least 7 hours a day.  - I advised patient to maintain close follow up with Rosita Fire, MD for primary care needs.   - Patient Care Time Today:  25 min, of which >50% was spent in reviewing his  current and  previous labs/studies, previous treatments, and medications doses and developing a plan for long-term care based on the latest recommendations for standards of care.  Randy Hebert participated in the discussions, expressed understanding, and voiced agreement with the above plans.  All questions were answered to his satisfaction. he is encouraged to contact clinic should he have any questions or concerns prior to his return visit.   Follow up plan: - Return in about 4 months (around 07/17/2019) for Follow up with Pre-visit Labs, Meter, and Logs, Follow up with Pre-visit Labs.  Glade Lloyd, MD [facility] [address] [place], [place] [address] Phone: [phone]  Fax: [phone]    03/16/2019, 8:27  AM  This note was partially dictated with voice recognition software. Similar sounding words can be transcribed inadequately or may not  be corrected upon review.

## 2019-03-18 ENCOUNTER — Ambulatory Visit (INDEPENDENT_AMBULATORY_CARE_PROVIDER_SITE_OTHER): Payer: Medicare Other

## 2019-03-18 ENCOUNTER — Other Ambulatory Visit: Payer: Self-pay

## 2019-03-18 DIAGNOSIS — J309 Allergic rhinitis, unspecified: Secondary | ICD-10-CM | POA: Diagnosis not present

## 2019-03-21 DIAGNOSIS — E119 Type 2 diabetes mellitus without complications: Secondary | ICD-10-CM | POA: Diagnosis not present

## 2019-03-21 DIAGNOSIS — Z1389 Encounter for screening for other disorder: Secondary | ICD-10-CM | POA: Diagnosis not present

## 2019-03-21 DIAGNOSIS — I10 Essential (primary) hypertension: Secondary | ICD-10-CM | POA: Diagnosis not present

## 2019-03-21 DIAGNOSIS — Z0001 Encounter for general adult medical examination with abnormal findings: Secondary | ICD-10-CM | POA: Diagnosis not present

## 2019-03-21 DIAGNOSIS — I2510 Atherosclerotic heart disease of native coronary artery without angina pectoris: Secondary | ICD-10-CM | POA: Diagnosis not present

## 2019-03-21 NOTE — Progress Notes (Signed)
[facility] Summary Report / Loop Recorder 

## 2019-03-23 DIAGNOSIS — I10 Essential (primary) hypertension: Secondary | ICD-10-CM | POA: Diagnosis not present

## 2019-03-23 DIAGNOSIS — E119 Type 2 diabetes mellitus without complications: Secondary | ICD-10-CM | POA: Diagnosis not present

## 2019-03-28 ENCOUNTER — Other Ambulatory Visit: Payer: Self-pay

## 2019-03-28 NOTE — Patient Outreach (Signed)
[facility]) Care Management  03/28/2019  Randy Hebert 10/18/1965 [id number]   Medication Adherence call to [name] [facility] spoke with patient he is past due on Atorvastatin 40 mg patient explain he is taking 1 tablet daily and has enough for five days and will order it then patient wants to keep all his medication to be fill on the same day. Randy Hebert is showing past due under [place].   [place] Management Direct Dial [phone]  Fax [phone] [email or web address]

## 2019-03-30 ENCOUNTER — Ambulatory Visit (INDEPENDENT_AMBULATORY_CARE_PROVIDER_SITE_OTHER): Payer: Medicare Other

## 2019-03-30 ENCOUNTER — Other Ambulatory Visit: Payer: Self-pay

## 2019-03-30 DIAGNOSIS — J309 Allergic rhinitis, unspecified: Secondary | ICD-10-CM | POA: Diagnosis not present

## 2019-04-11 ENCOUNTER — Ambulatory Visit (INDEPENDENT_AMBULATORY_CARE_PROVIDER_SITE_OTHER): Payer: Medicare Other | Admitting: *Deleted

## 2019-04-11 DIAGNOSIS — R55 Syncope and collapse: Secondary | ICD-10-CM

## 2019-04-12 LAB — CUP PACEART REMOTE DEVICE CHECK
Date Time Interrogation Session: 20200811094531
Implantable Pulse Generator Implant Date: 20181029

## 2019-04-13 ENCOUNTER — Ambulatory Visit (INDEPENDENT_AMBULATORY_CARE_PROVIDER_SITE_OTHER): Payer: Medicare Other

## 2019-04-13 ENCOUNTER — Other Ambulatory Visit: Payer: Self-pay

## 2019-04-13 DIAGNOSIS — J309 Allergic rhinitis, unspecified: Secondary | ICD-10-CM | POA: Diagnosis not present

## 2019-04-19 NOTE — Progress Notes (Signed)
[facility] Summary Report / Loop Recorder 

## 2019-04-21 DIAGNOSIS — I10 Essential (primary) hypertension: Secondary | ICD-10-CM | POA: Diagnosis not present

## 2019-04-21 DIAGNOSIS — I2510 Atherosclerotic heart disease of native coronary artery without angina pectoris: Secondary | ICD-10-CM | POA: Diagnosis not present

## 2019-04-22 ENCOUNTER — Ambulatory Visit (INDEPENDENT_AMBULATORY_CARE_PROVIDER_SITE_OTHER): Payer: Medicare Other | Admitting: *Deleted

## 2019-04-22 DIAGNOSIS — J309 Allergic rhinitis, unspecified: Secondary | ICD-10-CM

## 2019-05-13 ENCOUNTER — Encounter: Payer: Self-pay | Admitting: Allergy & Immunology

## 2019-05-13 ENCOUNTER — Other Ambulatory Visit: Payer: Self-pay

## 2019-05-13 ENCOUNTER — Ambulatory Visit (INDEPENDENT_AMBULATORY_CARE_PROVIDER_SITE_OTHER): Payer: Medicare Other | Admitting: Allergy & Immunology

## 2019-05-13 VITALS — BP 132/70 | HR 81 | Temp 98.1°F | Resp 22

## 2019-05-13 DIAGNOSIS — T781XXD Other adverse food reactions, not elsewhere classified, subsequent encounter: Secondary | ICD-10-CM

## 2019-05-13 DIAGNOSIS — J3089 Other allergic rhinitis: Secondary | ICD-10-CM | POA: Diagnosis not present

## 2019-05-13 DIAGNOSIS — J309 Allergic rhinitis, unspecified: Secondary | ICD-10-CM | POA: Diagnosis not present

## 2019-05-13 DIAGNOSIS — J449 Chronic obstructive pulmonary disease, unspecified: Secondary | ICD-10-CM | POA: Diagnosis not present

## 2019-05-13 DIAGNOSIS — J302 Other seasonal allergic rhinitis: Secondary | ICD-10-CM | POA: Diagnosis not present
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
## 2019-05-13 NOTE — Patient Instructions (Addendum)
1. Asthma-COPD overlap syndrome - Lung testing looks fairly stable today.  - We are not going to make any changes since you are doing fairly well. - Daily controller medication(s): Spiriva 2.36mcg one inhalation once daily and Symbicort 160/4.10mcg two puffs twice daily with spacer - Prior to physical activity: Ventolin 2 puffs 10-15 minutes before physical activity. - Rescue medications: Ventolin 4 puffs every 4-6 hours as needed - Asthma control goals:  * Full participation in all desired activities (may need albuterol before activity) * Albuterol use two time or less a week on average (not counting use with activity) * Cough interfering with sleep two time or less a month * Oral steroids no more than once a year * No hospitalizations  2. Chronic rhinitis (grasses, ragweed, weeds, indoor molds, outdoor molds, cat, dog and cockroach) - Continue with the shots at the current schedule (you have not quite reached maintenance or Red Vial).  - Continue with: Zyrtec (cetirizine) 10mg  tablet once daily and Astelin (azelastine) 2 sprays per nostril 1-2 times daily as needed - I think once you reach higher on our allergy shots into a more concentrated vial, symptoms will improve.   3. Return in about 6 months (around 11/10/2019). This can be an in-person, a virtual Webex or a telephone follow up visit.   Please inform us of any Emergency Department visits, hospitalizations, or changes in symptoms. Call us before going to the ED for breathing or allergy symptoms since we might be able to fit you in for a sick visit. Feel free to contact us anytime with any questions, problems, or concerns.  It was a pleasure to see you again today!  Websites that have reliable patient information: 1. American Academy of Asthma, Allergy, and Immunology: www.aaaai.org 2. Food Allergy Research and Education (FARE): foodallergy.org 3. Mothers of Asthmatics: http://www.asthmacommunitynetwork.org 4. American College of  Allergy, Asthma, and Immunology: www.acaai.org  "Like" [place] on Facebook and Instagram for our latest updates!      Make sure you are registered to vote! If you have moved or changed any of your contact information, you will need to get this updated before voting!  In some cases, you MAY be able to register to vote online: [email or web address]    Voter ID laws are NOT going into effect for the General Election in November 2020! DO NOT let this stop you from exercising your right to vote!   Absentee voting is the SAFEST way to vote during the coronavirus pandemic!   Download and print an absentee ballot request form at [email or web address] or you can scan the QR code below with your smart phone:      More information on absentee ballots can be found here: [email or web address]

## 2019-05-16 ENCOUNTER — Ambulatory Visit (INDEPENDENT_AMBULATORY_CARE_PROVIDER_SITE_OTHER): Payer: Medicare Other | Admitting: *Deleted

## 2019-05-16 ENCOUNTER — Encounter: Payer: Self-pay | Admitting: Internal Medicine

## 2019-05-16 DIAGNOSIS — R55 Syncope and collapse: Secondary | ICD-10-CM

## 2019-05-16 LAB — CUP PACEART REMOTE DEVICE CHECK
Date Time Interrogation Session: 20200913113924
Implantable Pulse Generator Implant Date: 20181029

## 2019-05-17 ENCOUNTER — Emergency Department (HOSPITAL_COMMUNITY)
Admission: EM | Admit: 2019-05-17 | Discharge: 2019-05-17 | Disposition: A | Discharge status: Home / Self Care | Payer: Medicare Other | Attending: Emergency Medicine | Admitting: Emergency Medicine

## 2019-05-17 ENCOUNTER — Encounter (HOSPITAL_COMMUNITY): Payer: Self-pay

## 2019-05-17 ENCOUNTER — Other Ambulatory Visit: Payer: Self-pay

## 2019-05-17 DIAGNOSIS — I110 Hypertensive heart disease with heart failure: Secondary | ICD-10-CM | POA: Diagnosis not present

## 2019-05-17 DIAGNOSIS — S81812A Laceration without foreign body, left lower leg, initial encounter: Secondary | ICD-10-CM | POA: Insufficient documentation

## 2019-05-17 DIAGNOSIS — I5042 Chronic combined systolic (congestive) and diastolic (congestive) heart failure: Secondary | ICD-10-CM | POA: Insufficient documentation

## 2019-05-17 DIAGNOSIS — Z96641 Presence of right artificial hip joint: Secondary | ICD-10-CM | POA: Diagnosis not present

## 2019-05-17 DIAGNOSIS — Y999 Unspecified external cause status: Secondary | ICD-10-CM | POA: Diagnosis not present

## 2019-05-17 DIAGNOSIS — J449 Chronic obstructive pulmonary disease, unspecified: Secondary | ICD-10-CM | POA: Insufficient documentation

## 2019-05-17 DIAGNOSIS — W293XXA Contact with powered garden and outdoor hand tools and machinery, initial encounter: Secondary | ICD-10-CM | POA: Insufficient documentation

## 2019-05-17 DIAGNOSIS — Z955 Presence of coronary angioplasty implant and graft: Secondary | ICD-10-CM | POA: Insufficient documentation

## 2019-05-17 DIAGNOSIS — I252 Old myocardial infarction: Secondary | ICD-10-CM | POA: Insufficient documentation

## 2019-05-17 DIAGNOSIS — Z7984 Long term (current) use of oral hypoglycemic drugs: Secondary | ICD-10-CM | POA: Insufficient documentation

## 2019-05-17 DIAGNOSIS — Z7982 Long term (current) use of aspirin: Secondary | ICD-10-CM | POA: Diagnosis not present

## 2019-05-17 DIAGNOSIS — Z23 Encounter for immunization: Secondary | ICD-10-CM | POA: Diagnosis not present

## 2019-05-17 DIAGNOSIS — Y92007 Garden or yard of unspecified non-institutional (private) residence as the place of occurrence of the external cause: Secondary | ICD-10-CM | POA: Insufficient documentation

## 2019-05-17 DIAGNOSIS — E1051 Type 1 diabetes mellitus with diabetic peripheral angiopathy without gangrene: Secondary | ICD-10-CM | POA: Insufficient documentation

## 2019-05-17 DIAGNOSIS — Y93H2 Activity, gardening and landscaping: Secondary | ICD-10-CM | POA: Diagnosis not present

## 2019-05-17 DIAGNOSIS — Z79899 Other long term (current) drug therapy: Secondary | ICD-10-CM | POA: Diagnosis not present

## 2019-05-17 MED ORDER — TETANUS-DIPHTH-ACELL PERTUSSIS 5-2.5-18.5 LF-MCG/0.5 IM SUSP
0.5000 mL | Freq: Once | INTRAMUSCULAR | Status: AC
  Administered 2019-05-17: 0.5 mL via INTRAMUSCULAR
  Filled 2019-05-17: qty 0.5

## 2019-05-17 MED ORDER — LIDOCAINE HCL (PF) 1 % IJ SOLN
INTRAMUSCULAR | Status: AC
  Filled 2019-05-17: qty 30

## 2019-05-17 MED ORDER — LIDOCAINE HCL (PF) 1 % IJ SOLN
10.0000 mL | Freq: Once | INTRAMUSCULAR | Status: AC
  Administered 2019-05-17: 10 mL
  Filled 2019-05-17: qty 30

## 2019-05-17 NOTE — ED Triage Notes (Signed)
Pt has a laceration to left knee from chainsaw. Bleeding controlled at triage. Ambulatory with a steady gait. Unsure of last tetanus shot.

## 2019-05-17 NOTE — ED Provider Notes (Signed)
[facility] EMERGENCY DEPARTMENT Provider Note   CSN: [id number] Arrival date & time: 05/17/19  1403     History   Chief Complaint Chief Complaint  Patient presents with  . Laceration    HPI Randy Hebert is a 53 y.o. male.     The history is provided by the patient. No language interpreter was used.  Laceration Location:  Leg Leg laceration location:  L lower leg Length:  3 Depth:  Through dermis Time since incident:  1 hour Laceration mechanism:  Metal edge Pain details:    Quality:  Aching   Severity:  Moderate   Progression:  Worsening Foreign body present:  No foreign bodies Relieved by:  Nothing Worsened by:  Nothing Tetanus status:  Out of date Pt complains of cutting his left leg with a chainsaw.  Pt unsure opf last tetanus shot  Past Medical History:  Diagnosis Date  . Allergy   . Angio-edema   . Anxiety   . Arthritis   . Asthma    hip replacement  . Back pain   . Bulging of cervical intervertebral disc   . CAD (coronary artery disease)    lateral STEMI 02/06/2015 00% D1 occlusion treated with Promus Premier 2.5 mm x 16 mm DES, 70% ramus stenosis, 40% mid RCA stenosis, 45% distal RCA stenosis, EF 45-50%  . CHF (congestive heart failure) ([place])   . COPD (chronic obstructive pulmonary disease) ([place])   . Depression   . Diabetes mellitus without complication ([place])   . 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 Dry eye   . Dyspnea   . Early satiety 09/23/2016  . GERD (gastroesophageal reflux disease)   . Headache   . Heart murmur   . Hip pain   . Hyperlipidemia   . Hypertension   . MI (myocardial infarction) ([place])   . Myocardial infarction ([place])   . Neck pain   . Otitis media   . Pleurisy   . Sinus pause    9 sec sinus pause on telemetry after started on coreg after MI, avoid AV nodal blocking agent  . Sleep apnea   . 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 Unilateral primary osteoarthritis, right hip  07/01/2016    Patient Active Problem List   Diagnosis Date Noted  . Seasonal and perennial allergic rhinitis 11/10/2018  . Restrictive lung disease 11/10/2018  . Fatty liver 11/08/2018  . Rectal bleeding 11/08/2018  . LLQ pain 08/04/2018  . Diarrhea 08/04/2018  . Melena 08/04/2018  . Barrett's esophagus 08/04/2018  . Transaminitis 08/04/2018  . Weight gain 06/30/2018  . Vitamin D deficiency 06/30/2018  . Alcohol abuse 06/30/2018  . DM type 2 causing vascular disease ([place]) 08/17/2017  . Syncope 06/29/2017  . HCAP (healthcare-associated pneumonia) 05/15/2017  . Uncontrolled type 2 diabetes mellitus with hyperglycemia, without long-term current use of insulin ([place]) 05/15/2017  . Acute on chronic respiratory failure with hypoxia ([place]) 05/15/2017  . Lobar pneumonia ([place]) 05/15/2017  . Chronic combined systolic and diastolic CHF (congestive heart failure) ([place]) 05/15/2017  . Sinus pause 05/15/2017  . Syncope and collapse 05/15/2017  . Bradycardia 04/28/2017  . Sleep apnea 04/28/2017  . Non-ST elevation (NSTEMI) myocardial infarction ([place]) 04/27/2017  . NSTEMI (non-ST elevated myocardial infarction) ([place]) 04/26/2017  . Substance abuse ([place]) 04/15/2017  . Depression with anxiety 04/15/2017  . GERD (gastroesophageal reflux disease) 09/23/2016  . 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  Asthma-COPD overlap syndrome ([place]) 08/27/2015  . Cigarette nicotine dependence with nicotine-induced disorder 08/27/2015  . Mixed hyperlipidemia 08/27/2015  . Class 2 severe obesity due to excess calories with serious comorbidity and body mass index (BMI) of 35.0 to 35.9 in adult [facility]) 08/27/2015  . Acute on chronic systolic heart failure ([place]) 06/02/2015  . ACE inhibitor-aggravated angioedema   . Chronic systolic CHF (congestive heart failure) ([place]) 06/01/2015  . CAD (coronary artery disease) 05/31/2015  . STEMI (ST elevation myocardial infarction) ([place]) 05/31/2015  . Current smoker  05/31/2015  . Essential hypertension, benign 02/09/2015    Past Surgical History:  Procedure Laterality Date  . BIOPSY  10/09/2016   Procedure: BIOPSY;  Surgeon: Daneil Dolin, MD;  Location[facility];  Service: Endoscopy;;  . CARDIAC CATHETERIZATION N/A 02/06/2015   Procedure: Left Heart Cath and Coronary Angiography;  Surgeon: Leonie Man, MD;  Location: [place] [facility];  Service: Cardiovascular;  Laterality: N/A;  . CARDIAC CATHETERIZATION N/A 02/06/2015   Procedure: Coronary Stent Intervention;  Surgeon: Leonie Man, MD;  Location: [place] [facility];  Service: Cardiovascular;  Laterality: N/A;  . COLONOSCOPY WITH PROPOFOL N/A 10/09/2016   Procedure: COLONOSCOPY WITH PROPOFOL;  Surgeon: Daneil Dolin, MD;  Location[facility];  Service: Endoscopy;  Laterality: N/A;  12:00 pm  . COLONOSCOPY WITH PROPOFOL N/A 11/24/2016   Dr. Gala Romney: Multiple colon polyps removed, tubular adenoma.  Next colonoscopy in 5 years.  . CORONARY ANGIOPLASTY WITH STENT PLACEMENT  01/2015  . CORONARY STENT INTERVENTION N/A 04/27/2017   Procedure: CORONARY STENT INTERVENTION;  Surgeon: Nelva Bush, MD;  Location: [place] [facility];  Service: Cardiovascular;  Laterality: N/A;  . ELECTROPHYSIOLOGY STUDY N/A 06/29/2017   Procedure: ELECTROPHYSIOLOGY STUDY;  Surgeon: Evans Lance, MD;  Location: [place] [facility];  Service: Cardiovascular;  Laterality: N/A;  . ESOPHAGOGASTRODUODENOSCOPY (EGD) WITH PROPOFOL N/A 10/09/2016   Dr. Gala Romney: LA grade a esophagitis.  Barrett's esophagus, biopsy-proven.  Small hiatal hernia.  EGD February 2019  . INCISION / DRAINAGE HAND / FINGER    . LEFT HEART CATH AND CORONARY ANGIOGRAPHY N/A 04/27/2017   Procedure: LEFT HEART CATH AND CORONARY ANGIOGRAPHY;  Surgeon: Nelva Bush, MD;  Location: [place] [facility];  Service: Cardiovascular;  Laterality: N/A;  . LOOP RECORDER INSERTION  06/29/2017   Procedure: Loop Recorder Insertion;  Surgeon: Evans Lance, MD;   Location: [place] [facility];  Service: Cardiovascular;;  . POLYPECTOMY  11/24/2016   Procedure: POLYPECTOMY;  Surgeon: Daneil Dolin, MD;  [facility];  Service: Endoscopy;;  descending and sigmoid  . TOTAL HIP ARTHROPLASTY Right 07/01/2016  . TOTAL HIP ARTHROPLASTY Right 07/01/2016   Procedure: RIGHT TOTAL HIP ARTHROPLASTY ANTERIOR APPROACH;  Surgeon: Mcarthur Rossetti, MD;  Location: [place];  Service: Orthopedics;  Laterality: Right;        Home Medications    Prior to Admission medications   Medication Sig Start Date End Date Taking? Authorizing Provider  acetaminophen (TYLENOL) 325 MG tablet Take 650 mg by mouth every 6 (six) hours as needed for mild pain.   Yes [provider]  albuterol (PROVENTIL) (2.5 MG/3ML) 0.083% nebulizer solution Take 3 mLs (2.5 mg total) by nebulization every 6 (six) hours as needed for wheezing or shortness of breath. 05/27/16  Yes Soyla Dryer, PA-C  amLODipine (NORVASC) 10 MG tablet TAKE 1 TABLET BY MOUTH ONCE A DAY. 02/14/19  Yes Nida, Marella Chimes, MD  aspirin 81 MG  EC tablet Take 1 tablet (81 mg total) by mouth daily. 04/29/17  Yes Reino Bellis B, NP  atorvastatin (LIPITOR) 40 MG tablet TAKE 1 TABLET BY MOUTH ONCE A DAY. 02/14/19  Yes Nida, Marella Chimes, MD  azelastine (ASTELIN) 0.1 % nasal spray Place 2 sprays into both nostrils 2 (two) times daily. 11/10/18  Yes Valentina Shaggy, MD  budesonide-formoterol [facility]) 160-4.5 MCG/ACT inhaler Inhale 2 puffs into the lungs 2 (two) times daily. 07/14/18  Yes Valentina Shaggy, MD  Carboxymeth-Glycerin-Polysorb (REFRESH OPTIVE ADVANCED) 0.5-1-0.5 % SOLN Apply 1 drop to eye 2 (two) times daily as needed (dry eyes).    Yes [provider]  Cholecalciferol (VITAMIN D3) 125 MCG (5000 UT) CAPS Take 1 capsule (5,000 Units total) by mouth daily. 11/09/18  Yes Nida, Marella Chimes, MD  citalopram (CELEXA) 20 MG tablet Take 20 mg by mouth daily. 11/06/17  Yes  [provider]  cycloSPORINE (RESTASIS) 0.05 % ophthalmic emulsion Place 1 drop into both eyes 2 (two) times daily.   Yes [provider]  ferrous sulfate 325 (65 FE) MG tablet Take 325 mg by mouth daily with breakfast.   Yes [provider]  furosemide (LASIX) 40 MG tablet TAKE 1 TABLET BY MOUTH ONCE DAILY. 05/04/18  Yes Evans Lance, MD  Magnesium 200 MG TABS Take 1 tablet (200 mg total) by mouth daily. 03/06/17  Yes Lendon Colonel, NP  metFORMIN (GLUCOPHAGE) 1000 MG tablet Take 1 tablet (1,000 mg total) by mouth 2 (two) times daily with a meal. 04/15/17  Yes Raylene Everts, MD  mometasone (ELOCON) 0.1 % cream Apply 1 application topically daily.  10/21/18  Yes [provider]  naproxen (NAPROSYN) 500 MG tablet Take 500 mg by mouth as needed.   Yes [provider]  nitroGLYCERIN (NITROSTAT) 0.4 MG SL tablet Place 1 tablet (0.4 mg total) under the tongue every 5 (five) minutes as needed for chest pain. 10/21/17  Yes Raylene Everts, MD  pantoprazole (PROTONIX) 40 MG tablet TAKE 1 TABLET BY MOUTH ONCE A DAY. 05/04/18  Yes Carlis Stable, NP  PAZEO 0.7 % SOLN  11/19/18  Yes [provider]  potassium chloride SA (K-DUR,KLOR-CON) 20 MEQ tablet TAKE 1 TABLET BY MOUTH DAILY. 12/17/17  Yes Raylene Everts, MD  PROVENTIL HFA 108 [phone] Base) MCG/ACT inhaler INHALE 2 PUFFS INTO THE LUNGS EVERY 6 HOURS AS NEEDED FOR SHORTNESS OF BREATH OR WHEEZING. 08/13/17  Yes Raylene Everts, MD  Tiotropium Bromide Monohydrate (SPIRIVA RESPIMAT) 2.5 MCG/ACT AERS Inhale 1 puff into the lungs daily. 07/14/18 05/17/19 Yes Valentina Shaggy, MD  glucose blood (IGLUCOSE TEST STRIPS) test strip 1 each by Other route 2 (two) times daily. Use as instructed Patient not taking: Reported on 05/17/2019 07/31/17   Raylene Everts, MD  meloxicam (MOBIC) 15 MG tablet Take 1 tablet (15 mg total) by mouth daily. Take 1 daily with food. Patient not taking: Reported on  05/17/2019 03/03/19   Margarita Mail, PA-C  tiZANidine (ZANAFLEX) 2 MG tablet Take 1 tablet (2 mg total) by mouth every 6 (six) hours as needed for muscle spasms. Patient not taking: Reported on 05/17/2019 03/03/19   Margarita Mail, PA-C    Family History Family History  Problem Relation Age of Onset  . Heart attack Father   . Stroke Father   . Arthritis Father   . Heart disease Father   . Cancer Mother        ???  .  Arthritis Mother   . Heart disease Brother 21       died in sleep  . Early death Brother   . Diabetes Maternal Uncle   . Alzheimer's disease Maternal Grandmother     Social History Social History   Tobacco Use  . Smoking status: Current Every Day Smoker    Packs/day: 1.50    Years: 46.00    Pack years: 69.00    Types: Cigarettes  . Smokeless tobacco: Former Systems developer    Types: Chew  Substance Use Topics  . Alcohol use: Yes    Alcohol/week: 11.0 - 12.0 standard drinks    Types: 6 Cans of beer, 5 - 6 Shots of liquor per week    Comment: alcoholic, now drinks intermittently.  As of 11/08/18, patient reports that he may have a drink of whiskey to 3 times per week.  He denies ever being an alcoholic  . Drug use: No    Comment: history of drug use- marijuana, cocaine- quit 2014     Allergies   Carvedilol, Lisinopril, and Amoxicillin   Review of Systems Review of Systems  All other systems reviewed and are negative.    Physical Exam Updated Vital Signs BP (!) 142/91   Pulse 94   Temp 98 F (36.7 C) (Oral)   Resp 20   Ht 5\' 5"  (1.651 m)   Wt 95.3 kg   SpO2 95%   BMI 34.95 kg/m   Physical Exam Vitals signs reviewed.  HENT:     Head: Normocephalic.     Nose: Nose normal.     Mouth/Throat:     Mouth: Mucous membranes are moist.  Neck:     Musculoskeletal: Normal range of motion.  Cardiovascular:     Rate and Rhythm: Normal rate.     Pulses: Normal pulses.  Musculoskeletal:        General: Tenderness present.     Comments: 3cm laceration and 2cm  laceration from blades.   Skin:    General: Skin is warm.  Neurological:     General: No focal deficit present.  Psychiatric:        Mood and Affect: Mood normal.      ED Treatments / Results  Labs (all labs ordered are listed, but only abnormal results are displayed) Labs Reviewed - No data to display  EKG None  Radiology No results found.  Procedures .Marland KitchenLaceration Repair  Date/Time: 05/17/2019 3:34 PM Performed by: Fransico Meadow, PA-C Authorized by: Fransico Meadow, PA-C   Consent:    Consent obtained:  Verbal   Consent given by:  Patient   Risks discussed:  Infection, need for additional repair, pain, poor cosmetic result and poor wound healing   Alternatives discussed:  No treatment and delayed treatment Universal protocol:    Procedure explained and questions answered to patient or proxy's satisfaction: yes     Relevant documents present and verified: yes     Test results available and properly labeled: yes     Imaging studies available: yes     Required blood products, implants, devices, and special equipment available: yes     Site/side marked: yes     Immediately prior to procedure, a time out was called: yes     Patient identity confirmed:  Verbally with patient Anesthesia (see MAR for exact dosages):    Anesthesia method:  Local infiltration Laceration details:    Length (cm):  3   Depth (mm):  2 Repair type:  Repair type:  Simple Pre-procedure details:    Preparation:  Patient was prepped and draped in usual sterile fashion Exploration:    Wound exploration: wound explored through full range of motion     Contaminated: no   Treatment:    Area cleansed with:  Betadine   Amount of cleaning:  Standard   Irrigation solution:  Sterile saline   Visualized foreign bodies/material removed: no   Skin repair:    Repair method:  Sutures   Suture size:  5-0   Suture technique:  Simple interrupted   Number of sutures:  7 Approximation:     Approximation:  Loose Post-procedure details:    Patient tolerance of procedure:  Tolerated well, no immediate complications   (including critical care time)  Medications Ordered in ED Medications  lidocaine (PF) (XYLOCAINE) 1 % injection 10 mL (10 mLs Infiltration Given 05/17/19 1523)  Tdap (BOOSTRIX) injection 0.5 mL (0.5 mLs Intramuscular Given 05/17/19 1441)     Initial Impression / Assessment and Plan / ED Course  I have reviewed the triage vital signs and the nursing notes.  Pertinent labs & imaging results that were available during my care of the patient were reviewed by me and considered in my medical decision making (see chart for details).        MDM  Pt counseled on sutures and suture removal   Final Clinical Impressions(s) / ED Diagnoses   Final diagnoses:  Leg laceration, left, initial encounter    ED Discharge Orders    None       Sidney Ace 05/17/19 1536    Noemi Chapel, MD 05/26/19 [phone]

## 2019-05-17 NOTE — Discharge Instructions (Addendum)
Suture removal in 8 days

## 2019-05-20 ENCOUNTER — Other Ambulatory Visit: Payer: Self-pay | Admitting: Nurse Practitioner

## 2019-05-20 ENCOUNTER — Ambulatory Visit (INDEPENDENT_AMBULATORY_CARE_PROVIDER_SITE_OTHER): Payer: Medicare Other

## 2019-05-20 DIAGNOSIS — J3089 Other allergic rhinitis: Secondary | ICD-10-CM | POA: Diagnosis not present

## 2019-05-20 DIAGNOSIS — J302 Other seasonal allergic rhinitis: Secondary | ICD-10-CM | POA: Diagnosis not present

## 2019-05-22 DIAGNOSIS — I2510 Atherosclerotic heart disease of native coronary artery without angina pectoris: Secondary | ICD-10-CM | POA: Diagnosis not present

## 2019-05-22 DIAGNOSIS — I10 Essential (primary) hypertension: Secondary | ICD-10-CM | POA: Diagnosis not present

## 2019-05-26 DIAGNOSIS — Z23 Encounter for immunization: Secondary | ICD-10-CM | POA: Diagnosis not present

## 2019-05-27 ENCOUNTER — Ambulatory Visit: Payer: Self-pay | Admitting: *Deleted

## 2019-05-27 DIAGNOSIS — J309 Allergic rhinitis, unspecified: Secondary | ICD-10-CM

## 2019-05-27 NOTE — Progress Notes (Signed)
[facility] Summary Report / Loop Recorder 

## 2019-05-31 DIAGNOSIS — I10 Essential (primary) hypertension: Secondary | ICD-10-CM | POA: Diagnosis not present

## 2019-05-31 DIAGNOSIS — E119 Type 2 diabetes mellitus without complications: Secondary | ICD-10-CM | POA: Diagnosis not present

## 2019-05-31 DIAGNOSIS — S81012D Laceration without foreign body, left knee, subsequent encounter: Secondary | ICD-10-CM | POA: Diagnosis not present

## 2019-06-08 ENCOUNTER — Ambulatory Visit (INDEPENDENT_AMBULATORY_CARE_PROVIDER_SITE_OTHER): Payer: Medicare Other

## 2019-06-08 DIAGNOSIS — J309 Allergic rhinitis, unspecified: Secondary | ICD-10-CM

## 2019-06-17 ENCOUNTER — Ambulatory Visit (INDEPENDENT_AMBULATORY_CARE_PROVIDER_SITE_OTHER): Payer: Medicare Other | Admitting: *Deleted

## 2019-06-17 ENCOUNTER — Ambulatory Visit (INDEPENDENT_AMBULATORY_CARE_PROVIDER_SITE_OTHER): Payer: Medicare Other

## 2019-06-17 DIAGNOSIS — J309 Allergic rhinitis, unspecified: Secondary | ICD-10-CM | POA: Diagnosis not present

## 2019-06-17 DIAGNOSIS — R55 Syncope and collapse: Secondary | ICD-10-CM | POA: Diagnosis not present

## 2019-06-19 LAB — CUP PACEART REMOTE DEVICE CHECK
Date Time Interrogation Session: 20201016113904
Implantable Pulse Generator Implant Date: 20181029

## 2019-06-22 NOTE — Progress Notes (Signed)
[facility] Summary Report / Loop Recorder 

## 2019-06-24 ENCOUNTER — Ambulatory Visit (INDEPENDENT_AMBULATORY_CARE_PROVIDER_SITE_OTHER): Payer: Medicare Other

## 2019-06-24 DIAGNOSIS — J309 Allergic rhinitis, unspecified: Secondary | ICD-10-CM

## 2019-06-30 DIAGNOSIS — I2510 Atherosclerotic heart disease of native coronary artery without angina pectoris: Secondary | ICD-10-CM | POA: Diagnosis not present

## 2019-06-30 DIAGNOSIS — K219 Gastro-esophageal reflux disease without esophagitis: Secondary | ICD-10-CM | POA: Diagnosis not present

## 2019-07-08 ENCOUNTER — Ambulatory Visit (INDEPENDENT_AMBULATORY_CARE_PROVIDER_SITE_OTHER): Payer: Medicare Other

## 2019-07-08 DIAGNOSIS — J309 Allergic rhinitis, unspecified: Secondary | ICD-10-CM | POA: Diagnosis not present

## 2019-07-13 ENCOUNTER — Ambulatory Visit (INDEPENDENT_AMBULATORY_CARE_PROVIDER_SITE_OTHER): Payer: Medicare Other

## 2019-07-13 DIAGNOSIS — J309 Allergic rhinitis, unspecified: Secondary | ICD-10-CM | POA: Diagnosis not present

## 2019-07-14 DIAGNOSIS — E1159 Type 2 diabetes mellitus with other circulatory complications: Secondary | ICD-10-CM | POA: Diagnosis not present

## 2019-07-15 LAB — COMPLETE METABOLIC PANEL WITH GFR
AG Ratio: 1.6 (calc) (ref 1.0–2.5)
ALT: 40 U/L (ref 9–46)
AST: 34 U/L (ref 10–35)
Albumin: 4.2 g/dL (ref 3.6–5.1)
Alkaline phosphatase (APISO): 96 U/L (ref 35–144)
BUN: 10 mg/dL (ref 7–25)
CO2: 26 mmol/L (ref 20–32)
Calcium: 9.2 mg/dL (ref 8.6–10.3)
Chloride: 102 mmol/L (ref 98–110)
Creat: 0.74 mg/dL (ref 0.70–1.33)
GFR, Est African American: 121 mL/min/{1.73_m2} (ref >=60)
GFR, Est Non African American: 105 mL/min/{1.73_m2} (ref >=60)
Globulin: 2.7 g/dL (calc) (ref 1.9–3.7)
Glucose, Bld: 137 mg/dL — ABNORMAL HIGH (ref 65–99)
Potassium: 4.8 mmol/L (ref 3.5–5.3)
Sodium: 138 mmol/L (ref 135–146)
Total Bilirubin: 0.9 mg/dL (ref 0.2–1.2)
Total Protein: 6.9 g/dL (ref 6.1–8.1)

## 2019-07-15 LAB — HEMOGLOBIN A1C
Hgb A1c MFr Bld: 7.5 % of total Hgb — ABNORMAL HIGH (ref <5.7)
Mean Plasma Glucose: 169 (calc)
eAG (mmol/L): 9.3 (calc)

## 2019-07-19 ENCOUNTER — Encounter: Payer: Self-pay | Admitting: Internal Medicine

## 2019-07-19 ENCOUNTER — Other Ambulatory Visit: Payer: Self-pay

## 2019-07-19 ENCOUNTER — Encounter: Payer: Self-pay | Admitting: "Endocrinology

## 2019-07-19 ENCOUNTER — Ambulatory Visit (INDEPENDENT_AMBULATORY_CARE_PROVIDER_SITE_OTHER): Payer: Medicare Other | Admitting: "Endocrinology

## 2019-07-19 DIAGNOSIS — E1159 Type 2 diabetes mellitus with other circulatory complications: Secondary | ICD-10-CM | POA: Diagnosis not present

## 2019-07-19 DIAGNOSIS — E782 Mixed hyperlipidemia: Secondary | ICD-10-CM

## 2019-07-19 DIAGNOSIS — I10 Essential (primary) hypertension: Secondary | ICD-10-CM

## 2019-07-19 MED ORDER — GLIPIZIDE ER 5 MG PO TB24: 5.0000 mg | ORAL_TABLET | Freq: Every day | ORAL | 3 refills | Status: DC

## 2019-07-19 MED ORDER — VITAMIN D3 125 MCG (5000 UT) PO CAPS: 5000.0000 [IU] | ORAL_CAPSULE | Freq: Every day | ORAL | 0 refills | Status: DC

## 2019-07-19 NOTE — Progress Notes (Signed)
07/19/2019, 5:27 PM                                Endocrinology Telehealth Visit Follow up Note -During COVID -19 Pandemic  I connected with Randy Hebert on 07/19/2019   by telephone and verified that I am speaking with the correct person using two identifiers. Randy Hebert, 1965-11-16. he has verbally consented to this visit. All issues noted in this document were discussed and addressed. The format was not optimal for physical exam   Subjective:    Patient ID: Randy Hebert, male    DOB: 1965/10/14.  Randy Hebert is being engaged in telehealth via form follow-up for management of currently uncontrolled symptomatic type 2 diabetes, hyperlipidemia, hypertension. PMD:  Rosita Fire, MD.   Past Medical History:  Diagnosis Date  . Allergy   . Angio-edema   . Anxiety   . Arthritis   . Asthma    hip replacement  . Back pain   . Bulging of cervical intervertebral disc   . CAD (coronary artery disease)    lateral STEMI 02/06/2015 00% D1 occlusion treated with Promus Premier 2.5 mm x 16 mm DES, 70% ramus stenosis, 40% mid RCA stenosis, 45% distal RCA stenosis, EF 45-50%  . CHF (congestive heart failure) ([place])   . COPD (chronic obstructive pulmonary disease) ([place])   . Depression   . Diabetes mellitus without complication ([place])   . 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 Dry eye   . Dyspnea   . Early satiety 09/23/2016  . GERD (gastroesophageal reflux disease)   . Headache   . Heart murmur   . Hip pain   . Hyperlipidemia   . Hypertension   . MI (myocardial infarction) ([place])   . Myocardial infarction ([place])   . Neck pain   . Otitis media   . Pleurisy   . Sinus pause    9 sec sinus pause on telemetry after started on coreg after MI, avoid AV nodal blocking agent  . Sleep apnea   . 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 Unilateral primary osteoarthritis, right hip 07/01/2016   Past Surgical History:   Procedure Laterality Date  . BIOPSY  10/09/2016   Procedure: BIOPSY;  Surgeon: Daneil Dolin, MD;  [facility];  Service: Endoscopy;;  . CARDIAC CATHETERIZATION N/A 02/06/2015   Procedure: Left Heart Cath and Coronary Angiography;  Surgeon: Leonie Man, MD;  Location: [facility];  Service: Cardiovascular;  Laterality: N/A;  . CARDIAC CATHETERIZATION N/A 02/06/2015   Procedure: Coronary Stent Intervention;  Surgeon: Leonie Man, MD;  Location: [facility];  Service: Cardiovascular;  Laterality: N/A;  . COLONOSCOPY WITH PROPOFOL N/A 10/09/2016   Procedure: COLONOSCOPY WITH PROPOFOL;  Surgeon: Daneil Dolin, MD;  [facility];  Service: Endoscopy;  Laterality: N/A;  12:00 pm  . COLONOSCOPY WITH PROPOFOL N/A 11/24/2016   Dr. Gala Romney: Multiple colon polyps removed, tubular adenoma.  Next colonoscopy in 5 years.  . CORONARY ANGIOPLASTY WITH STENT PLACEMENT  01/2015  . CORONARY STENT INTERVENTION N/A 04/27/2017   Procedure: CORONARY STENT INTERVENTION;  Surgeon: Nelva Bush, MD;  Location: [facility];  Service: Cardiovascular;  Laterality: N/A;  . ELECTROPHYSIOLOGY STUDY N/A 06/29/2017   Procedure: ELECTROPHYSIOLOGY STUDY;  Surgeon: Cristopher Peru  W, MD;  Location: [facility];  Service: Cardiovascular;  Laterality: N/A;  . ESOPHAGOGASTRODUODENOSCOPY (EGD) WITH PROPOFOL N/A 10/09/2016   Dr. Gala Romney: LA grade a esophagitis.  Barrett's esophagus, biopsy-proven.  Small hiatal hernia.  EGD February 2019  . INCISION / DRAINAGE HAND / FINGER    . LEFT HEART CATH AND CORONARY ANGIOGRAPHY N/A 04/27/2017   Procedure: LEFT HEART CATH AND CORONARY ANGIOGRAPHY;  Surgeon: Nelva Bush, MD;  Location: [facility];  Service: Cardiovascular;  Laterality: N/A;  . LOOP RECORDER INSERTION  06/29/2017   Procedure: Loop Recorder Insertion;  Surgeon: Evans Lance, MD;  Location: [facility];  Service: Cardiovascular;;  . POLYPECTOMY  11/24/2016    Procedure: POLYPECTOMY;  Surgeon: Daneil Dolin, MD;  [facility];  Service: Endoscopy;;  descending and sigmoid  . TOTAL HIP ARTHROPLASTY Right 07/01/2016  . TOTAL HIP ARTHROPLASTY Right 07/01/2016   Procedure: RIGHT TOTAL HIP ARTHROPLASTY ANTERIOR APPROACH;  Surgeon: Mcarthur Rossetti, MD;  Location: [facility];  Service: Orthopedics;  Laterality: Right;   Social History   Socioeconomic History  . Marital status: Significant Other    Spouse name: alice  . Number of children: 3  . Years of education: 83  . Highest education level: Not on file  Occupational History  . Occupation: unemployed    Comment: Nature conservation officer    Comment: disability pending  Social Needs  . Financial resource strain: Not on file  . Food insecurity    Worry: Not on file    Inability: Not on file  . Transportation needs    Medical: Not on file    Non-medical: Not on file  Tobacco Use  . Smoking status: Current Every Day Smoker    Packs/day: 1.50    Years: 46.00    Pack years: 69.00    Types: Cigarettes  . Smokeless tobacco: Former Systems developer    Types: Chew  Substance and Sexual Activity  . Alcohol use: Yes    Alcohol/week: 11.0 - 12.0 standard drinks    Types: 6 Cans of beer, 5 - 6 Shots of liquor per week    Comment: alcoholic, now drinks intermittently.  As of 11/08/18, patient reports that he may have a drink of whiskey to 3 times per week.  He denies ever being an alcoholic  . Drug use: No    Comment: history of drug use- marijuana, cocaine- quit 2014  . Sexual activity: Yes    Partners: Female    Birth control/protection: None  Lifestyle  . Physical activity    Days per week: Not on file    Minutes per session: Not on file  . Stress: Not on file  Relationships  . Social Herbalist on phone: Not on file    Gets together: Not on file    Attends religious service: Not on file    Active member of club or organization: Not on file    Attends meetings of clubs or  organizations: Not on file    Relationship status: Not on file  Other Topics Concern  . Not on file  Social History Narrative   Lives with girlfriend [name]   Leisure - TV   Lost his son 07-03-2018 due to overdose   Outpatient Encounter Medications as of 07/19/2019  Medication Sig  . acetaminophen (TYLENOL) 325 MG tablet Take 650 mg by mouth every 6 (six) hours as needed for mild pain.  Marland Kitchen albuterol (PROVENTIL) (2.5 MG/3ML)  0.083% nebulizer solution Take 3 mLs (2.5 mg total) by nebulization every 6 (six) hours as needed for wheezing or shortness of breath.  Marland Kitchen amLODipine (NORVASC) 10 MG tablet TAKE 1 TABLET BY MOUTH ONCE A DAY.  Marland Kitchen aspirin 81 MG EC tablet Take 1 tablet (81 mg total) by mouth daily.  Marland Kitchen atorvastatin (LIPITOR) 40 MG tablet TAKE 1 TABLET BY MOUTH ONCE A DAY.  Marland Kitchen azelastine (ASTELIN) 0.1 % nasal spray Place 2 sprays into both nostrils 2 (two) times daily.  . budesonide-formoterol (SYMBICORT) 160-4.5 MCG/ACT inhaler Inhale 2 puffs into the lungs 2 (two) times daily.  . Carboxymeth-Glycerin-Polysorb (REFRESH OPTIVE ADVANCED) 0.5-1-0.5 % SOLN Apply 1 drop to eye 2 (two) times daily as needed (dry eyes).   . Cholecalciferol (VITAMIN D3) 125 MCG (5000 UT) CAPS Take 1 capsule (5,000 Units total) by mouth daily.  . citalopram (CELEXA) 20 MG tablet Take 20 mg by mouth daily.  . cycloSPORINE (RESTASIS) 0.05 % ophthalmic emulsion Place 1 drop into both eyes 2 (two) times daily.  . ferrous sulfate 325 (65 FE) MG tablet Take 325 mg by mouth daily with breakfast.  . furosemide (LASIX) 40 MG tablet TAKE 1 TABLET BY MOUTH ONCE DAILY.  Marland Kitchen glipiZIDE (GLUCOTROL XL) 5 MG 24 hr tablet Take 1 tablet (5 mg total) by mouth daily with breakfast.  . glucose blood (IGLUCOSE TEST STRIPS) test strip 1 each by Other route 2 (two) times daily. Use as instructed (Patient not taking: Reported on 05/17/2019)  . Magnesium 200 MG TABS Take 1 tablet (200 mg total) by mouth daily.  . meloxicam (MOBIC) 15 MG tablet Take 1  tablet (15 mg total) by mouth daily. Take 1 daily with food. (Patient not taking: Reported on 05/17/2019)  . metFORMIN (GLUCOPHAGE) 1000 MG tablet Take 1 tablet (1,000 mg total) by mouth 2 (two) times daily with a meal.  . mometasone (ELOCON) 0.1 % cream Apply 1 application topically daily.   . naproxen (NAPROSYN) 500 MG tablet Take 500 mg by mouth as needed.  . nitroGLYCERIN (NITROSTAT) 0.4 MG SL tablet Place 1 tablet (0.4 mg total) under the tongue every 5 (five) minutes as needed for chest pain.  . pantoprazole (PROTONIX) 40 MG tablet TAKE 1 TABLET BY MOUTH ONCE A DAY.  Marland Kitchen PAZEO 0.7 % SOLN   . potassium chloride SA (K-DUR,KLOR-CON) 20 MEQ tablet TAKE 1 TABLET BY MOUTH DAILY.  Marland Kitchen PROVENTIL HFA 108 (90 Base) MCG/ACT inhaler INHALE 2 PUFFS INTO THE LUNGS EVERY 6 HOURS AS NEEDED FOR SHORTNESS OF BREATH OR WHEEZING.  . Tiotropium Bromide Monohydrate (SPIRIVA RESPIMAT) 2.5 MCG/ACT AERS Inhale 1 puff into the lungs daily.  Marland Kitchen tiZANidine (ZANAFLEX) 2 MG tablet Take 1 tablet (2 mg total) by mouth every 6 (six) hours as needed for muscle spasms. (Patient not taking: Reported on 05/17/2019)  . [DISCONTINUED] Cholecalciferol (VITAMIN D3) 125 MCG (5000 UT) CAPS Take 1 capsule (5,000 Units total) by mouth daily.   No facility-administered encounter medications on file as of 07/19/2019.     ALLERGIES: Allergies  Allergen Reactions  . Carvedilol Other (See Comments)    Sinus pause on telemetry >3 seconds. Longest one 9 sec. No AV nodal agent  . Lisinopril Anaphylaxis, Shortness Of Breath and Swelling    Angioedema, required intubation and mechanical ventilation  . Amoxicillin Nausea And Vomiting    Nausea only - no allergy    VACCINATION STATUS: Immunization History  Administered Date(s) Administered  . Influenza,inj,Quad PF,6+ Mos 07/03/2016, 05/07/2017  . Pneumococcal  Conjugate-13 06/17/2017  . Pneumococcal Polysaccharide-23 02/07/2015  . Tdap 05/17/2019    Diabetes He presents for his follow-up  diabetic visit. He has type 2 diabetes mellitus. Onset time: He was diagnosed at approximate age of 89 years. His disease course has been worsening. There are no hypoglycemic associated symptoms. Pertinent negatives for hypoglycemia include no confusion, headaches, pallor or seizures. Pertinent negatives for diabetes include no chest pain, no fatigue, no polydipsia, no polyphagia, no polyuria and no weakness. There are no hypoglycemic complications. Symptoms are worsening. Diabetic complications include heart disease. Risk factors for coronary artery disease include dyslipidemia, diabetes mellitus, hypertension, male sex, family history, obesity, sedentary lifestyle and tobacco exposure. Current diabetic treatment includes oral agent (monotherapy). He is compliant with treatment most of the time. He is following a generally unhealthy diet. When asked about meal planning, he reported none. He has had a previous visit with a dietitian. He never participates in exercise. An ACE inhibitor/angiotensin II receptor blocker is contraindicated (He has documented allergy for ACE inhibitor's). He does not see a podiatrist.Eye exam is not current.  Hyperlipidemia This is a chronic problem. The current episode started more than 1 year ago. Exacerbating diseases include diabetes and obesity. Pertinent negatives include no chest pain, myalgias or shortness of breath. Current antihyperlipidemic treatment includes statins. Risk factors for coronary artery disease include dyslipidemia, diabetes mellitus, family history, obesity, male sex, hypertension and a sedentary lifestyle.  Hypertension This is a chronic problem. The current episode started more than 1 year ago. Pertinent negatives include no chest pain, headaches, neck pain, palpitations or shortness of breath. Risk factors for coronary artery disease include diabetes mellitus, dyslipidemia, obesity, sedentary lifestyle and smoking/tobacco exposure. Past treatments include  calcium channel blockers. Hypertensive end-organ damage includes CAD/MI.   Review of systems: Limited as above.    Objective:    There were no vitals taken for this visit.  Wt Readings from Last 3 Encounters:  05/17/19 210 lb (95.3 kg)  03/03/19 210 lb (95.3 kg)  01/20/19 210 lb (95.3 kg)      CMP     Component Value Date/Time   NA 138 07/14/2019 0710   K 4.8 07/14/2019 0710   CL 102 07/14/2019 0710   CO2 26 07/14/2019 0710   GLUCOSE 137 (H) 07/14/2019 0710   BUN 10 07/14/2019 0710   CREATININE 0.74 07/14/2019 0710   CALCIUM 9.2 07/14/2019 0710   PROT 6.9 07/14/2019 0710   ALBUMIN 3.7 04/15/2017 0927   AST 34 07/14/2019 0710   ALT 40 07/14/2019 0710   ALKPHOS 98 04/15/2017 0927   BILITOT 0.9 07/14/2019 0710   GFRNONAA 105 07/14/2019 0710   GFRAA 121 07/14/2019 0710    Diabetic Labs (most recent): Lab Results  Component Value Date   HGBA1C 7.5 (H) 07/14/2019   HGBA1C 7.3 (H) 03/11/2019   HGBA1C 6.2 (H) 11/03/2018     Lipid Panel ( most recent) Lipid Panel     Component Value Date/Time   CHOL 115 11/03/2018 0705   TRIG 152 (H) 11/03/2018 0705   HDL 40 11/03/2018 0705   CHOLHDL 2.9 11/03/2018 0705   VLDL 41 (H) 04/27/2017 0505   LDLCALC 52 11/03/2018 0705      Lab Results  Component Value Date   TSH 1.58 11/03/2018   TSH 1.79 11/17/2017   TSH 1.635 04/26/2017   TSH 2.564 06/27/2015   FREET4 1.5 11/03/2018   FREET4 1.4 11/17/2017      Assessment & Plan:   1. DM  type 2 causing vascular disease ([place])  - Randy Hebert has currently uncontrolled symptomatic type 2 DM since  53 years of age. -He is not reported symptoms of hypoglycemia nor hyperglycemia.  His A1c is 7.5% increasing from 6.3% during his last visit.   -his diabetes is complicated by  coronary artery disease which required stent placement, obesity/sedentary life, chronic heavy smoking, and Randy Hebert remains at a high risk for more acute and chronic complications which include  CAD, CVA, CKD, retinopathy, and neuropathy. These are all discussed in detail with the patient.  - I have counseled him on diet management and weight loss, by adopting a carbohydrate restricted/protein rich diet. -Still admits to dietary indiscretions including consumption of sweets and sweetened beverages.  - he  admits there is a room for improvement in his diet and drink choices. -  Suggestion is made for him to avoid simple carbohydrates  from his diet including Cakes, Sweet Desserts / Pastries, Ice Cream, Soda (diet and regular), Sweet Tea, Candies, Chips, Cookies, Sweet Pastries,  Store Bought Juices, Alcohol in Excess of  1-2 drinks a day, Artificial Sweeteners, Coffee Creamer, and "Sugar-free" Products. This will help patient to have stable blood glucose profile and potentially avoid unintended weight gain.   - I encouraged him to switch to  unprocessed or minimally processed complex starch and increased protein intake (animal or plant source), fruits, and vegetables.  - he is advised to stick to a routine mealtimes to eat 3 meals  a day and avoid unnecessary snacks ( to snack only to correct hypoglycemia).    - I have approached him with the following individualized plan to manage diabetes and patient agrees:   -He has maintained reasonable control of his diabetes without insulin.  Emphasizing the need for him to stay engaged, given his history of coronary artery disease, he is advised to continue metformin 1000 mg p.o. twice daily-daily after breakfast and supper.   -He will benefit from low-dose glipizide.  I discussed and added glipizide 5 mg p.o. daily at breakfast.  - Patient specific target  A1c;  LDL, HDL, Triglycerides, and  Waist Circumference were discussed in detail.  2) BP/HTN: he is advised to home monitor blood pressure and report if > 140/90 on 2 separate readings.    He remains chronic heavy smoker. He has a documented allergy of ACE inhibitors anaphylaxis .  He is  advised to continue amlodipine 10 mg p.o. daily, with as needed diuretics.  He is extensively counseled for smoking cessation.     3) Lipids/HPL: Recent lipid panel showed controlled LDL at 46, triglycerides still high at 215.  He is advised to avoid butter and fried food.  Due to elevated liver enzymes (which are mainly due to his alcohol abuse) I advised him to lower his atorvastatin to 40 mg from 80 mg.  Patient continues to use alcohol heavily, could explain the transaminitis.  His most recent labs show improvement in his liver enzymes.  He is advised to consider weaning of alcohol off.  4)  Weight/Diet: CDE Consult has been initiated and in progress,  exercise, and detailed carbohydrates information provided.  5) Chronic Care/Health Maintenance:  -he  is on Statin medications and  is encouraged to continue to follow up with Ophthalmology, Dentist,  Podiatrist at least yearly or according to recommendations, and advised to  Quit smoking ( is a chronic heavy smoker for the last 40 days), and consider weaning off of alcohol.  I  have recommended yearly flu vaccine and pneumonia vaccination at least every 5 years; moderate intensity exercise for up to 150 minutes weekly; and  sleep for at least 7 hours a day.  - I advised patient to maintain close follow up with Rosita Fire, MD for primary care needs.   - Patient Care Time Today:  25 min, of which >50% was spent in  counseling and the rest reviewing his  current and  previous labs/studies, previous treatments, his blood glucose readings, and medications' doses and developing a plan for long-term care based on the latest recommendations for standards of care.   Randy Hebert participated in the discussions, expressed understanding, and voiced agreement with the above plans.  All questions were answered to his satisfaction. he is encouraged to contact clinic should he have any questions or concerns prior to his return visit.    Follow up  plan: - Return in about 4 months (around 11/16/2019) for Follow up with Pre-visit Labs, Next Visit A1c in Office.  Glade Lloyd, MD [facility] [address] [place], [place] [address] Phone: [phone]  Fax: [phone]    07/19/2019, 5:27 PM  This note was partially dictated with voice recognition software. Similar sounding words can be transcribed inadequately or may not  be corrected upon review.

## 2019-07-20 ENCOUNTER — Ambulatory Visit (INDEPENDENT_AMBULATORY_CARE_PROVIDER_SITE_OTHER): Payer: Medicare Other | Admitting: *Deleted

## 2019-07-20 DIAGNOSIS — R55 Syncope and collapse: Secondary | ICD-10-CM | POA: Diagnosis not present

## 2019-07-20 DIAGNOSIS — I5023 Acute on chronic systolic (congestive) heart failure: Secondary | ICD-10-CM

## 2019-07-21 LAB — CUP PACEART REMOTE DEVICE CHECK
Date Time Interrogation Session: 20201118093532
Implantable Pulse Generator Implant Date: 20181029

## 2019-07-31 DIAGNOSIS — I2510 Atherosclerotic heart disease of native coronary artery without angina pectoris: Secondary | ICD-10-CM | POA: Diagnosis not present

## 2019-07-31 DIAGNOSIS — E119 Type 2 diabetes mellitus without complications: Secondary | ICD-10-CM | POA: Diagnosis not present

## 2019-08-03 ENCOUNTER — Ambulatory Visit (INDEPENDENT_AMBULATORY_CARE_PROVIDER_SITE_OTHER): Payer: Medicare Other

## 2019-08-03 DIAGNOSIS — J309 Allergic rhinitis, unspecified: Secondary | ICD-10-CM

## 2019-08-12 ENCOUNTER — Ambulatory Visit (INDEPENDENT_AMBULATORY_CARE_PROVIDER_SITE_OTHER): Payer: Medicare Other

## 2019-08-12 DIAGNOSIS — J309 Allergic rhinitis, unspecified: Secondary | ICD-10-CM

## 2019-08-18 NOTE — Progress Notes (Signed)
[facility] Summary Report / Loop Recorder 

## 2019-08-19 ENCOUNTER — Ambulatory Visit (INDEPENDENT_AMBULATORY_CARE_PROVIDER_SITE_OTHER): Payer: Medicare Other

## 2019-08-19 DIAGNOSIS — J309 Allergic rhinitis, unspecified: Secondary | ICD-10-CM | POA: Diagnosis not present

## 2019-08-19 DIAGNOSIS — I2510 Atherosclerotic heart disease of native coronary artery without angina pectoris: Secondary | ICD-10-CM | POA: Diagnosis not present

## 2019-08-19 DIAGNOSIS — I10 Essential (primary) hypertension: Secondary | ICD-10-CM | POA: Diagnosis not present

## 2019-08-19 DIAGNOSIS — E119 Type 2 diabetes mellitus without complications: Secondary | ICD-10-CM | POA: Diagnosis not present

## 2019-08-22 LAB — CUP PACEART REMOTE DEVICE CHECK
Date Time Interrogation Session: 20201221093731
Implantable Pulse Generator Implant Date: 20181029

## 2019-08-31 ENCOUNTER — Ambulatory Visit (INDEPENDENT_AMBULATORY_CARE_PROVIDER_SITE_OTHER): Payer: Medicare Other

## 2019-08-31 DIAGNOSIS — J309 Allergic rhinitis, unspecified: Secondary | ICD-10-CM | POA: Diagnosis not present

## 2019-09-01 ENCOUNTER — Ambulatory Visit (INDEPENDENT_AMBULATORY_CARE_PROVIDER_SITE_OTHER): Payer: Medicare Other | Admitting: Gastroenterology

## 2019-09-01 ENCOUNTER — Encounter: Payer: Self-pay | Admitting: *Deleted

## 2019-09-01 ENCOUNTER — Other Ambulatory Visit: Payer: Self-pay | Admitting: *Deleted

## 2019-09-01 ENCOUNTER — Other Ambulatory Visit: Payer: Self-pay

## 2019-09-01 ENCOUNTER — Encounter: Payer: Self-pay | Admitting: Gastroenterology

## 2019-09-01 ENCOUNTER — Telehealth: Payer: Self-pay | Admitting: Gastroenterology

## 2019-09-01 VITALS — BP 137/78 | HR 87 | Temp 96.9°F | Ht 65.0 in | Wt 209.2 lb

## 2019-09-01 DIAGNOSIS — K760 Fatty (change of) liver, not elsewhere classified: Secondary | ICD-10-CM | POA: Diagnosis not present

## 2019-09-01 DIAGNOSIS — K2270 Barrett's esophagus without dysplasia: Secondary | ICD-10-CM

## 2019-09-01 NOTE — Telephone Encounter (Signed)
LMOVM

## 2019-09-01 NOTE — Telephone Encounter (Signed)
[facility] clinical pool:  Please let patient know I would like to update liver imaging due to physical exam findings today in clinic. Liver is enlarged, which has been noted before. History of fatty liver, +ETOH. Needs US abdomen Complete (I want to assess spleen size).

## 2019-09-01 NOTE — Telephone Encounter (Signed)
Patient called back. And he is aware of below. Agreeable to having US done.  Called CS and this has been scheduled for 09/06/2019 at 8:30am, arrival 8:15am, npo midnight;.  Called pt, aware of [place] appt details.

## 2019-09-01 NOTE — Progress Notes (Signed)
Referring Provider: Rosita Fire, MD Primary Care Physician:  Rosita Fire, MD Primary GI: Dr. Gala Romney    Chief Complaint  Patient presents with  . Barrett's esophagus    due for EGD, doing ok    HPI:   Randy Hebert is a 53 y.o. male presenting today with a history of Barrett's esophagus, due for surveillance now. Known history of fatty liver, with negative Hep C antibody, negative Hep B surface antigen, negative hemochromatosis screen. Most recent LFTs in Nov 2020 are normal.   GERD controlled on Protonix once daily. Tries not to eat late. No N/V. No abdominal pain. Had been dealing with constipation but now more normal. No dysphagia or rectal bleeding. No melena or hematochezia. After losing his son was drinking more than 1 pint a day. Now drinking 1 pint whiskey a day but planning on decreasing in 2021. Feels like he doesn't need the alcohol anymore.   Cut back on red meats, eating more vegetables.   Past Medical History:  Diagnosis Date  . Allergy   . Angio-edema   . Anxiety   . Arthritis   . Asthma    hip replacement  . Back pain   . Bulging of cervical intervertebral disc   . CAD (coronary artery disease)    lateral STEMI 02/06/2015 00% D1 occlusion treated with Promus Premier 2.5 mm x 16 mm DES, 70% ramus stenosis, 40% mid RCA stenosis, 45% distal RCA stenosis, EF 45-50%  . CHF (congestive heart failure) ([place])   . COPD (chronic obstructive pulmonary disease) ([place])   . Depression   . Diabetes mellitus without complication ([place])   . 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 Dry eye   . Dyspnea   . Early satiety 09/23/2016  . GERD (gastroesophageal reflux disease)   . Headache   . Heart murmur   . Hip pain   . Hyperlipidemia   . Hypertension   . MI (myocardial infarction) ([place])   . Myocardial infarction ([place])   . Neck pain   . Otitis media   . Pleurisy   . Sinus pause    9 sec sinus pause on telemetry after started  on coreg after MI, avoid AV nodal blocking agent  . Sleep apnea   . 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 Unilateral primary osteoarthritis, right hip 07/01/2016    Past Surgical History:  Procedure Laterality Date  . BIOPSY  10/09/2016   Procedure: BIOPSY;  Surgeon: Daneil Dolin, MD;  [facility];  Service: Endoscopy;;  . CARDIAC CATHETERIZATION N/A 02/06/2015   Procedure: Left Heart Cath and Coronary Angiography;  Surgeon: Leonie Man, MD;  Location: [facility];  Service: Cardiovascular;  Laterality: N/A;  . CARDIAC CATHETERIZATION N/A 02/06/2015   Procedure: Coronary Stent Intervention;  Surgeon: Leonie Man, MD;  Location: [facility];  Service: Cardiovascular;  Laterality: N/A;  . COLONOSCOPY WITH PROPOFOL N/A 10/09/2016   Procedure: COLONOSCOPY WITH PROPOFOL;  Surgeon: Daneil Dolin, MD;  [facility];  Service: Endoscopy;  Laterality: N/A;  12:00 pm  . COLONOSCOPY WITH PROPOFOL N/A 11/24/2016   Dr. Gala Romney: Multiple colon polyps removed, tubular adenoma.  Next colonoscopy in 5 years.  . CORONARY ANGIOPLASTY WITH STENT PLACEMENT  01/2015  . CORONARY STENT INTERVENTION N/A 04/27/2017   Procedure: CORONARY STENT INTERVENTION;  Surgeon: Nelva Bush, MD;  Location: [place] [facility];  Service: Cardiovascular;  Laterality: N/A;  . ELECTROPHYSIOLOGY STUDY N/A 06/29/2017   Procedure: ELECTROPHYSIOLOGY STUDY;  Surgeon: Evans Lance, MD;  Location: [facility];  Service: Cardiovascular;  Laterality: N/A;  . ESOPHAGOGASTRODUODENOSCOPY (EGD) WITH PROPOFOL N/A 10/09/2016   Dr. Gala Romney: LA grade a esophagitis.  Barrett's esophagus, biopsy-proven.  Small hiatal hernia.  EGD February 2019  . INCISION / DRAINAGE HAND / FINGER    . LEFT HEART CATH AND CORONARY ANGIOGRAPHY N/A 04/27/2017   Procedure: LEFT HEART CATH AND CORONARY ANGIOGRAPHY;  Surgeon: Nelva Bush, MD;  Location: [facility];  Service: Cardiovascular;  Laterality: N/A;  .  LOOP RECORDER INSERTION  06/29/2017   Procedure: Loop Recorder Insertion;  Surgeon: Evans Lance, MD;  Location: [facility];  Service: Cardiovascular;;  . POLYPECTOMY  11/24/2016   Procedure: POLYPECTOMY;  Surgeon: Daneil Dolin, MD;  [facility];  Service: Endoscopy;;  descending and sigmoid  . TOTAL HIP ARTHROPLASTY Right 07/01/2016  . TOTAL HIP ARTHROPLASTY Right 07/01/2016   Procedure: RIGHT TOTAL HIP ARTHROPLASTY ANTERIOR APPROACH;  Surgeon: Mcarthur Rossetti, MD;  Location: [facility];  Service: Orthopedics;  Laterality: Right;    Current Outpatient Medications  Medication Sig Dispense Refill  . acetaminophen (TYLENOL) 325 MG tablet Take 650 mg by mouth every 6 (six) hours as needed for mild pain.    Marland Kitchen albuterol (PROVENTIL) (2.5 MG/3ML) 0.083% nebulizer solution Take 3 mLs (2.5 mg total) by nebulization every 6 (six) hours as needed for wheezing or shortness of breath. 150 mL 1  . amLODipine (NORVASC) 10 MG tablet TAKE 1 TABLET BY MOUTH ONCE A DAY. 30 tablet 0  . aspirin 81 MG EC tablet Take 1 tablet (81 mg total) by mouth daily. (Patient taking differently: Take 81 mg by mouth 2 (two) times daily. ) 30 tablet   . atorvastatin (LIPITOR) 40 MG tablet TAKE 1 TABLET BY MOUTH ONCE A DAY. 30 tablet 0  . azelastine (ASTELIN) 0.1 % nasal spray Place 2 sprays into both nostrils 2 (two) times daily. 30 mL 5  . budesonide-formoterol (SYMBICORT) 160-4.5 MCG/ACT inhaler Inhale 2 puffs into the lungs 2 (two) times daily. 1 Inhaler 5  . Carboxymeth-Glycerin-Polysorb (REFRESH OPTIVE ADVANCED) 0.5-1-0.5 % SOLN Apply 1 drop to eye 2 (two) times daily as needed (dry eyes).     . Cholecalciferol (VITAMIN D3) 125 MCG (5000 UT) CAPS Take 1 capsule (5,000 Units total) by mouth daily. 90 capsule 0  . citalopram (CELEXA) 20 MG tablet Take 20 mg by mouth daily.    . cycloSPORINE (RESTASIS) 0.05 % ophthalmic emulsion Place 1 drop into both eyes 2 (two) times daily.    . ferrous sulfate 325  (65 FE) MG tablet Take 325 mg by mouth daily with breakfast.    . furosemide (LASIX) 40 MG tablet TAKE 1 TABLET BY MOUTH ONCE DAILY. 30 tablet 6  . glipiZIDE (GLUCOTROL XL) 5 MG 24 hr tablet Take 1 tablet (5 mg total) by mouth daily with breakfast. 30 tablet 3  . glucose blood (IGLUCOSE TEST STRIPS) test strip 1 each by Other route 2 (two) times daily. Use as instructed 100 each 12  . Magnesium 200 MG TABS Take 1 tablet (200 mg total) by mouth daily. 30 each 6  . metFORMIN (GLUCOPHAGE) 1000 MG tablet Take 1 tablet (1,000 mg total) by mouth 2 (two) times daily with a meal. 180 tablet 3  . mometasone (ELOCON) 0.1 % cream Apply 1 application topically daily.     Marland Kitchen  naproxen (NAPROSYN) 500 MG tablet Take 500 mg by mouth as needed.    . nitroGLYCERIN (NITROSTAT) 0.4 MG SL tablet Place 1 tablet (0.4 mg total) under the tongue every 5 (five) minutes as needed for chest pain. 25 tablet 0  . pantoprazole (PROTONIX) 40 MG tablet TAKE 1 TABLET BY MOUTH ONCE A DAY. 30 tablet 5  . PAZEO 0.7 % SOLN     . PROVENTIL HFA 108 (90 Base) MCG/ACT inhaler INHALE 2 PUFFS INTO THE LUNGS EVERY 6 HOURS AS NEEDED FOR SHORTNESS OF BREATH OR WHEEZING. 18 g 1  . Tiotropium Bromide Monohydrate (SPIRIVA RESPIMAT) 2.5 MCG/ACT AERS Inhale 1 puff into the lungs daily. 1 Inhaler 5  . meloxicam (MOBIC) 15 MG tablet Take 1 tablet (15 mg total) by mouth daily. Take 1 daily with food. (Patient not taking: Reported on 05/17/2019) 10 tablet 0  . potassium chloride SA (K-DUR,KLOR-CON) 20 MEQ tablet TAKE 1 TABLET BY MOUTH DAILY. (Patient not taking: Reported on 09/01/2019) 30 tablet 0  . tiZANidine (ZANAFLEX) 2 MG tablet Take 1 tablet (2 mg total) by mouth every 6 (six) hours as needed for muscle spasms. (Patient not taking: Reported on 05/17/2019) 12 tablet 0   No current facility-administered medications for this visit.    Allergies as of 09/01/2019 - Review Complete 09/01/2019  Allergen Reaction Noted  . Carvedilol Other (See Comments)  02/08/2015  . Lisinopril Anaphylaxis, Shortness Of Breath, and Swelling 05/30/2015  . Amoxicillin Nausea And Vomiting 02/06/2015    Family History  Problem Relation Age of Onset  . Heart attack Father   . Stroke Father   . Arthritis Father   . Heart disease Father   . Cancer Mother        ???  . Arthritis Mother   . Heart disease Brother 70       died in sleep  . Early death Brother   . Diabetes Maternal Uncle   . Alzheimer's disease Maternal Grandmother     Social History   Socioeconomic History  . Marital status: Significant Other    Spouse name: alice  . Number of children: 3  . Years of education: 39  . Highest education level: Not on file  Occupational History  . Occupation: unemployed    Comment: Nature conservation officer    Comment: disability pending  Tobacco Use  . Smoking status: Current Every Day Smoker    Packs/day: 1.50    Years: 46.00    Pack years: 69.00    Types: Cigarettes  . Smokeless tobacco: Former Systems developer    Types: Chew  Substance and Sexual Activity  . Alcohol use: Yes    Alcohol/week: 0.0 standard drinks    Comment: pint of whiskey daily  . Drug use: No    Comment: history of drug use- marijuana, cocaine- quit 2014  . Sexual activity: Yes    Partners: Female    Birth control/protection: None  Other Topics Concern  . Not on file  Social History Narrative   Lives with girlfriend [name]   Leisure - TV   Lost his son 07-03-2018 due to overdose   Social Determinants of Health   Financial Resource Strain:   . Difficulty of Paying Living Expenses: Not on file  Food Insecurity:   . Worried About Charity fundraiser in the Last Year: Not on file  . Ran Out of Food in the Last Year: Not on file  Transportation Needs:   . Lack of Transportation (Medical): Not on file  .  Lack of Transportation (Non-Medical): Not on file  Physical Activity:   . Days of Exercise per Week: Not on file  . Minutes of Exercise per Session: Not on file  Stress:   .  Feeling of Stress : Not on file  Social Connections:   . Frequency of Communication with Friends and Family: Not on file  . Frequency of Social Gatherings with Friends and Family: Not on file  . Attends Religious Services: Not on file  . Active Member of Clubs or Organizations: Not on file  . Attends Archivist Meetings: Not on file  . Marital Status: Not on file    Review of Systems: Gen: Denies fever, chills, anorexia. Denies fatigue, weakness, weight loss.  CV: Denies chest pain, palpitations, syncope, peripheral edema, and claudication. Resp: Denies dyspnea at rest, cough, wheezing, coughing up blood, and pleurisy. GI: see HPI Derm: Denies rash, itching, dry skin Psych: Denies depression, anxiety, memory loss, confusion. No homicidal or suicidal ideation.  Heme: Denies bruising, bleeding, and enlarged lymph nodes.  Physical Exam: BP 137/78   Pulse 87   Temp (!) 96.9 F (36.1 C) (Temporal)   Ht 5\' 5"  (1.651 m)   Wt 209 lb 3.2 oz (94.9 kg)   BMI 34.81 kg/m  General:   Alert and oriented. No distress noted. Pleasant and cooperative.  Head:  Normocephalic and atraumatic. Lungs: clear bilaterally Cardiac: S1 S2 present without obvious murmur  Abdomen:  +BS, soft, distended and round, liver margin palpable extending below right costal margin by 9 fingerbreadths, and palpable liver border crossing midclavicular line. Rectus diastasis noted.  Msk:  Symmetrical without gross deformities. Normal posture. Extremities:  Without edema. Neurologic:  Alert and  oriented x4 Psych:  Alert and cooperative. Normal mood and affect.  ASSESSMENT: Randy Hebert is a 53 y.o. male presenting today with a history of Barrett's and due for surveillance now, with last EGD in 2018. Known fatty liver with most recent LFTs normal. Notably with hepatomegaly on exam, and he has continued to drink at least 1 pint whiskey a day following the death of his son last year. Last imaging in July 2020.  Appears hepatomegaly more pronounced today than on prior exams.  Needs EGD in near future for surveillance. Will also update US abdomen complete to assess liver and spleen. Patient is motivated to decrease drinking at this time for his health and was applauded on this. We discussed the risks of progression to cirrhosis if he continues to drink alcohol.    PLAN:   Proceed with upper endoscopy in the near future with Dr. Gala Romney. The risks, benefits, and alternatives have been discussed in detail with patient. They have stated understanding and desire to proceed.  Propofol due to polypharmacy  Update US abdomen complete  Continue PPI  Follow-up in 6 months   Annitta Needs, PhD, [facility]

## 2019-09-01 NOTE — Addendum Note (Signed)
Addended by: Cheron Every on: 09/01/2019 01:39 PM   Modules accepted: Orders

## 2019-09-01 NOTE — Patient Instructions (Addendum)
We are arranging an endoscopy with Dr. Gala Romney in the near future. Don't take any oral diabetes medications the day of the procedure.  Proud of you for making the decision to take healthy steps by cutting back and eliminating alcohol and choosing healthy diet.  We will see you in 6 months!   It was a pleasure to see you today. I want to create trusting relationships with patients to provide genuine, compassionate, and quality care. I value your feedback. If you receive a survey regarding your visit,  I greatly appreciate you taking time to fill this out.   Annitta Needs, PhD, ANP-BC [facility]

## 2019-09-06 ENCOUNTER — Ambulatory Visit (HOSPITAL_COMMUNITY)
Admission: RE | Admit: 2019-09-06 | Discharge: 2019-09-06 | Disposition: A | Discharge status: Home / Self Care | Payer: Medicare Other | Source: Ambulatory Visit | Attending: Gastroenterology | Admitting: Gastroenterology

## 2019-09-06 ENCOUNTER — Other Ambulatory Visit: Payer: Self-pay

## 2019-09-06 DIAGNOSIS — K760 Fatty (change of) liver, not elsewhere classified: Secondary | ICD-10-CM

## 2019-09-14 NOTE — Progress Notes (Signed)
VIALS EXP 09-13-20

## 2019-09-15 DIAGNOSIS — J301 Allergic rhinitis due to pollen: Secondary | ICD-10-CM | POA: Diagnosis not present

## 2019-09-16 ENCOUNTER — Other Ambulatory Visit: Payer: Self-pay | Admitting: Allergy & Immunology

## 2019-09-16 ENCOUNTER — Ambulatory Visit (INDEPENDENT_AMBULATORY_CARE_PROVIDER_SITE_OTHER): Payer: Medicare Other

## 2019-09-16 DIAGNOSIS — J309 Allergic rhinitis, unspecified: Secondary | ICD-10-CM

## 2019-09-19 DIAGNOSIS — I2510 Atherosclerotic heart disease of native coronary artery without angina pectoris: Secondary | ICD-10-CM | POA: Diagnosis not present

## 2019-09-19 DIAGNOSIS — K219 Gastro-esophageal reflux disease without esophagitis: Secondary | ICD-10-CM | POA: Diagnosis not present

## 2019-09-19 DIAGNOSIS — J3089 Other allergic rhinitis: Secondary | ICD-10-CM | POA: Diagnosis not present

## 2019-09-26 ENCOUNTER — Ambulatory Visit (INDEPENDENT_AMBULATORY_CARE_PROVIDER_SITE_OTHER): Payer: Medicare Other | Admitting: *Deleted

## 2019-09-26 DIAGNOSIS — R55 Syncope and collapse: Secondary | ICD-10-CM | POA: Diagnosis not present

## 2019-09-26 LAB — CUP PACEART REMOTE DEVICE CHECK
Date Time Interrogation Session: 20210124232754
Implantable Pulse Generator Implant Date: 20181029

## 2019-09-28 ENCOUNTER — Encounter: Payer: Medicare Other | Admitting: Internal Medicine

## 2019-10-04 ENCOUNTER — Encounter: Payer: Self-pay | Admitting: Cardiology

## 2019-10-04 ENCOUNTER — Other Ambulatory Visit: Payer: Self-pay

## 2019-10-04 ENCOUNTER — Ambulatory Visit (INDEPENDENT_AMBULATORY_CARE_PROVIDER_SITE_OTHER): Payer: Medicare Other | Admitting: Cardiology

## 2019-10-04 VITALS — BP 139/78 | HR 85 | Temp 98.5°F | Ht 65.0 in | Wt 207.0 lb

## 2019-10-04 DIAGNOSIS — I2510 Atherosclerotic heart disease of native coronary artery without angina pectoris: Secondary | ICD-10-CM | POA: Diagnosis not present

## 2019-10-04 DIAGNOSIS — R55 Syncope and collapse: Secondary | ICD-10-CM

## 2019-10-04 DIAGNOSIS — E782 Mixed hyperlipidemia: Secondary | ICD-10-CM

## 2019-10-04 DIAGNOSIS — I10 Essential (primary) hypertension: Secondary | ICD-10-CM

## 2019-10-04 NOTE — Patient Instructions (Signed)
Medication Instructions:  Your physician recommends that you continue on your current medications as directed. Please refer to the Current Medication list given to you today.   Labwork: none  Testing/Procedures: none  Follow-Up: Your physician wants you to follow-up in: 6 MONTHS.  You will receive a reminder letter in the mail two months in advance. If you don't receive a letter, please call our office to schedule the follow-up appointment.   Any Other Special Instructions Will Be Listed Below (If Applicable).     If you need a refill on your cardiac medications before your next appointment, please call your pharmacy.

## 2019-10-04 NOTE — Progress Notes (Signed)
Clinical Summary [name] is a 54 y.o.male seen today for follow up of the following medical problems.   1. CAD - admit 01/2015 with lateral STEMI, received DES to D1. There was a 70% ramus and 40% mid RCA that was medically managed - 01/2015 echo LVEF 40%, lateral wall hypokinesis - no beta blocker due to history of sinus pause and bradycardia. No ACE-I given history of severe angioedema, no ARB due to risk of cross reactivity - admit 12/2015 with atypical chest pain, worst with palpatoin and movement. Had done some heavy lifting the day it started, negative workup for ACS. Since that time no recurrent symptoms   04/2017 PCI with stent. -07/2018 nuclear stress prior infarct, no current ischemia    - no recent chest pain. SOB is chronic and unchanged. Better with breathing with tratements, worst with cold weather.  - compliant with meds     2. SOB/COPD - 07/2015 PFTs: mild to mod ventilatory defect with small airway obstruction.  - 07/2015 CT PE no PE, though suboptimal study - 07/2015 CXR no acute process.  PFTs with mild to mod vent defect, ABGs showed borderline resting hypoxemia.     3. Hyperlipidemia - endocrine lowered atorva from 80 to 40mg  due to elevated LFTs.  10/2018 TC 115 HDL 40 TG 152 LDL 52 - compliant with statin  4. Syncope - nocturnal bradycardia noted on previous monitors, no significant daytime episodes - has loop recorder, followed by EP -no beta blockers due to sinus pauses.   - no recent symptoms - Jan 2021 device check normal function, no episodes  5. HTN  - home bp's 130s/60s.     6. OSA -comliant withCPAP machine. - last study 2018. Needs establish over next few visits with sleep medicine    SH: within last few years his son overdosed,passed away   Past Medical History:  Diagnosis Date  . Allergy   . Angio-edema   . Anxiety   . Arthritis   . Asthma    hip replacement  . Back pain     . Bulging of cervical intervertebral disc   . CAD (coronary artery disease)    lateral STEMI 02/06/2015 00% D1 occlusion treated with Promus Premier 2.5 mm x 16 mm DES, 70% ramus stenosis, 40% mid RCA stenosis, 45% distal RCA stenosis, EF 45-50%  . CHF (congestive heart failure) ([place])   . COPD (chronic obstructive pulmonary disease) ([place])   . Depression   . Diabetes mellitus without complication ([place])   . 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 Dry eye   . Dyspnea   . Early satiety 09/23/2016  . GERD (gastroesophageal reflux disease)   . Headache   . Heart murmur   . Hip pain   . Hyperlipidemia   . Hypertension   . MI (myocardial infarction) ([place])   . Myocardial infarction ([place])   . Neck pain   . Otitis media   . Pleurisy   . Sinus pause    9 sec sinus pause on telemetry after started on coreg after MI, avoid AV nodal blocking agent  . Sleep apnea   . Substance abuse ([place]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  . Unilateral primary osteoarthritis, right hip 07/01/2016     Allergies  Allergen Reactions  . Carvedilol Other (See Comments)    Sinus pause on telemetry >3 seconds. Longest one 9 sec. No AV nodal agent  . Lisinopril Anaphylaxis, Shortness  Of Breath and Swelling    Angioedema, required intubation and mechanical ventilation  . Amoxicillin Nausea And Vomiting    Nausea only - no allergy     Current Outpatient Medications  Medication Sig Dispense Refill  . acetaminophen (TYLENOL) 325 MG tablet Take 650 mg by mouth every 6 (six) hours as needed for mild pain.    Marland Kitchen albuterol (PROVENTIL) (2.5 MG/3ML) 0.083% nebulizer solution Take 3 mLs (2.5 mg total) by nebulization every 6 (six) hours as needed for wheezing or shortness of breath. 150 mL 1  . amLODipine (NORVASC) 10 MG tablet TAKE 1 TABLET BY MOUTH ONCE A DAY. 30 tablet 0  . aspirin 81 MG EC tablet Take 1 tablet (81 mg total) by mouth daily. (Patient taking differently: Take 81 mg by mouth 2 (two)  times daily. ) 30 tablet   . atorvastatin (LIPITOR) 40 MG tablet TAKE 1 TABLET BY MOUTH ONCE A DAY. 30 tablet 0  . azelastine (ASTELIN) 0.1 % nasal spray USE 2 SPRAYS IN EACH NOSTRIL TWICE DAILY. 30 mL 2  . budesonide-formoterol (SYMBICORT) 160-4.5 MCG/ACT inhaler Inhale 2 puffs into the lungs 2 (two) times daily. 1 Inhaler 5  . Carboxymeth-Glycerin-Polysorb (REFRESH OPTIVE ADVANCED) 0.5-1-0.5 % SOLN Apply 1 drop to eye 2 (two) times daily as needed (dry eyes).     . Cholecalciferol (VITAMIN D3) 125 MCG (5000 UT) CAPS Take 1 capsule (5,000 Units total) by mouth daily. 90 capsule 0  . citalopram (CELEXA) 20 MG tablet Take 20 mg by mouth daily.    . cycloSPORINE (RESTASIS) 0.05 % ophthalmic emulsion Place 1 drop into both eyes 2 (two) times daily.    . ferrous sulfate 325 (65 FE) MG tablet Take 325 mg by mouth daily with breakfast.    . furosemide (LASIX) 40 MG tablet TAKE 1 TABLET BY MOUTH ONCE DAILY. 30 tablet 6  . glipiZIDE (GLUCOTROL XL) 5 MG 24 hr tablet Take 1 tablet (5 mg total) by mouth daily with breakfast. 30 tablet 3  . glucose blood (IGLUCOSE TEST STRIPS) test strip 1 each by Other route 2 (two) times daily. Use as instructed 100 each 12  . Magnesium 200 MG TABS Take 1 tablet (200 mg total) by mouth daily. 30 each 6  . metFORMIN (GLUCOPHAGE) 1000 MG tablet Take 1 tablet (1,000 mg total) by mouth 2 (two) times daily with a meal. 180 tablet 3  . mometasone (ELOCON) 0.1 % cream Apply 1 application topically daily.     . naproxen (NAPROSYN) 500 MG tablet Take 500 mg by mouth as needed.    . nitroGLYCERIN (NITROSTAT) 0.4 MG SL tablet Place 1 tablet (0.4 mg total) under the tongue every 5 (five) minutes as needed for chest pain. 25 tablet 0  . pantoprazole (PROTONIX) 40 MG tablet TAKE 1 TABLET BY MOUTH ONCE A DAY. 30 tablet 5  . PAZEO 0.7 % SOLN     . PROVENTIL HFA 108 (90 Base) MCG/ACT inhaler INHALE 2 PUFFS INTO THE LUNGS EVERY 6 HOURS AS NEEDED FOR SHORTNESS OF BREATH OR WHEEZING. 18 g 1   . SPIRIVA RESPIMAT 2.5 MCG/ACT AERS INHALE 1 PUFF INTO THE LUNGS DAILY. 4 g 2   No current facility-administered medications for this visit.     Past Surgical History:  Procedure Laterality Date  . BIOPSY  10/09/2016   Procedure: BIOPSY;  Surgeon: Daneil Dolin, MD;  [facility];  Service: Endoscopy;;  . CARDIAC CATHETERIZATION N/A 02/06/2015   Procedure: Left Heart Cath  and Coronary Angiography;  Surgeon: Leonie Man, MD;  Location: [facility];  Service: Cardiovascular;  Laterality: N/A;  . CARDIAC CATHETERIZATION N/A 02/06/2015   Procedure: Coronary Stent Intervention;  Surgeon: Leonie Man, MD;  Location: [facility];  Service: Cardiovascular;  Laterality: N/A;  . COLONOSCOPY WITH PROPOFOL N/A 10/09/2016   Procedure: COLONOSCOPY WITH PROPOFOL;  Surgeon: Daneil Dolin, MD;  [facility];  Service: Endoscopy;  Laterality: N/A;  12:00 pm  . COLONOSCOPY WITH PROPOFOL N/A 11/24/2016   Dr. Gala Romney: Multiple colon polyps removed, tubular adenoma.  Next colonoscopy in 5 years.  . CORONARY ANGIOPLASTY WITH STENT PLACEMENT  01/2015  . CORONARY STENT INTERVENTION N/A 04/27/2017   Procedure: CORONARY STENT INTERVENTION;  Surgeon: Nelva Bush, MD;  Location: [facility];  Service: Cardiovascular;  Laterality: N/A;  . ELECTROPHYSIOLOGY STUDY N/A 06/29/2017   Procedure: ELECTROPHYSIOLOGY STUDY;  Surgeon: Evans Lance, MD;  Location: [facility];  Service: Cardiovascular;  Laterality: N/A;  . ESOPHAGOGASTRODUODENOSCOPY (EGD) WITH PROPOFOL N/A 10/09/2016   Dr. Gala Romney: LA grade a esophagitis.  Barrett's esophagus, biopsy-proven.  Small hiatal hernia.  EGD February 2019  . INCISION / DRAINAGE HAND / FINGER    . LEFT HEART CATH AND CORONARY ANGIOGRAPHY N/A 04/27/2017   Procedure: LEFT HEART CATH AND CORONARY ANGIOGRAPHY;  Surgeon: Nelva Bush, MD;  Location: [facility];  Service: Cardiovascular;  Laterality: N/A;  . LOOP RECORDER  INSERTION  06/29/2017   Procedure: Loop Recorder Insertion;  Surgeon: Evans Lance, MD;  Location: [facility];  Service: Cardiovascular;;  . POLYPECTOMY  11/24/2016   Procedure: POLYPECTOMY;  Surgeon: Daneil Dolin, MD;  [facility];  Service: Endoscopy;;  descending and sigmoid  . TOTAL HIP ARTHROPLASTY Right 07/01/2016  . TOTAL HIP ARTHROPLASTY Right 07/01/2016   Procedure: RIGHT TOTAL HIP ARTHROPLASTY ANTERIOR APPROACH;  Surgeon: Mcarthur Rossetti, MD;  Location: [facility];  Service: Orthopedics;  Laterality: Right;     Allergies  Allergen Reactions  . Carvedilol Other (See Comments)    Sinus pause on telemetry >3 seconds. Longest one 9 sec. No AV nodal agent  . Lisinopril Anaphylaxis, Shortness Of Breath and Swelling    Angioedema, required intubation and mechanical ventilation  . Amoxicillin Nausea And Vomiting    Nausea only - no allergy      Family History  Problem Relation Age of Onset  . Heart attack Father   . Stroke Father   . Arthritis Father   . Heart disease Father   . Cancer Mother        ???  . Arthritis Mother   . Heart disease Brother 41       died in sleep  . Early death Brother   . Diabetes Maternal Uncle   . Alzheimer's disease Maternal Grandmother      Social History Mr. Byers reports that he has been smoking cigarettes. He has a 69.00 pack-year smoking history. He has quit using smokeless tobacco.  His smokeless tobacco use included chew. Mr. Sabino reports current alcohol use.   Review of Systems CONSTITUTIONAL: No weight loss, fever, chills, weakness or fatigue.  HEENT: Eyes: No visual loss, blurred vision, double vision or yellow sclerae.No hearing loss, sneezing, congestion, runny nose or sore throat.  SKIN: No rash or itching.  CARDIOVASCULAR: per hpi RESPIRATORY: No shortness of breath, cough or sputum.  GASTROINTESTINAL: No anorexia, nausea, vomiting or diarrhea. No abdominal pain or  blood.  GENITOURINARY: No  burning on urination, no polyuria NEUROLOGICAL: No headache, dizziness, syncope, paralysis, ataxia, numbness or tingling in the extremities. No change in bowel or bladder control.  MUSCULOSKELETAL: No muscle, back pain, joint pain or stiffness.  LYMPHATICS: No enlarged nodes. No history of splenectomy.  PSYCHIATRIC: No history of depression or anxiety.  ENDOCRINOLOGIC: No reports of sweating, cold or heat intolerance. No polyuria or polydipsia.  Marland Kitchen   Physical Examination Today's Vitals   10/04/19 1513  BP: 139/78  Pulse: 85  Temp: 98.5 F (36.9 C)  SpO2: 94%  Weight: 207 lb (93.9 kg)  Height: 5\' 5"  (1.651 m)   Body mass index is 34.45 kg/m.  Gen: resting comfortably, no acute distress HEENT: no scleral icterus, pupils equal round and reactive, no palptable cervical adenopathy,  CV: RRR, no m/r/g, no jvd Resp: Clear to auscultation bilaterally GI: abdomen is soft, non-tender, non-distended, normal bowel sounds, no hepatosplenomegaly MSK: extremities are warm, no edema.  Skin: warm, no rash Neuro:  no focal deficits Psych: appropriate affect   Diagnostic Studies  01/2015 cath 1. 1st Diag lesion, 100% stenosed. A Promus Premier 2.5 mm x 16 mm drug-eluting stent was placed. There is a 0% residual stenosis post intervention. 2. Ramus lesion, 70% stenosed. 3. Mid RCA lesion, 40% stenosed. Dist RCA lesion, 45% stenosed. 4. Mild to moderately reduced LVEF with anterolateral and apical hypokinesis and elevated LVEDP  Abnormal anatomy with equal sized Diagonal and LAD Jandi Swiger but extensive RCA. Successful PCI of the First Diagonal Shaunice Levitan.  Recommendations:  Standard post radial cath/PCI TR band removal  Dual independent therapy for minimum one year.  Check 2-D echocardiogram to better assess EF  Add statin and beta blocker. Smoking cessation counseling. Cardiac Rehabilitation And Case Management Consultation  If hemodynamic stable, would consider fast-track  discharge  Would consider noninvasive evaluation of the Ramus Intermedius lesion in the absence of any recurrent anginal pain.  01/2015 echo Study Conclusions  - Left ventricle: Moderately severe hypokinesis of mid anterolateral segment, apical lateral segment, mid/apical anterior segments. EF is 40%. The cavity size was normal. Wall thickness was increased in a pattern of mild LVH. - Aortic valve: Sclerosis without stenosis. There was no significant regurgitation. - Right ventricle: The cavity size was normal. Systolic function was normal.  07/2015 CT PE IMPRESSION: 1. Suboptimal contrast bolus timing in the pulmonary arterial tree, poor in the lobar vessels other than the right lower lobe. No central pulmonary embolus. If there is continued suspicion of acute PE nuclear medicine V/Q scan may be most helpful. 2. Situs abnormality in the chest with right side aortic arch and descending aorta. Mirror image branching. Other visible situs including cardiac and upper abdominal situs appear normal. 3. Calcified Coronary artery atherosclerosis. Hepatomegaly with fatty liver disease.  07/2015 PFTs: mild to mod ventilatory defect with small airway obstruction, moderately reduced DLCO.   07/2018 nuclear stress  There was no ST segment deviation noted during stress.  Defect 1: There is a medium defect of moderate severity present in the mid anterior, mid anterolateral and apical anterior location.  Findings consistent with prior myocardial infarction.  This is an intermediate risk study.  Nuclear stress EF: 53%.   Assessment and Plan  1. CAD -no recent symptoms, continue current meds - EKG shows SR, no acute ischemic changes   2. Hyperlipideima - has been at goal, continue statin - upcoming labs with endocrine   3. HTN -home bp's essentially at goal, conitnue current meds  4. History of syncope/bradycardia - no symptoms, continue to moniotor. Loop  recorder monitored by EP      Arnoldo Lenis, M.D.

## 2019-10-07 ENCOUNTER — Inpatient Hospital Stay (HOSPITAL_COMMUNITY)
Admission: EM | Admit: 2019-10-07 | Discharge: 2019-10-11 | DRG: 177 | Disposition: A | Discharge status: Home / Self Care | Payer: Medicare Other | Attending: Internal Medicine | Admitting: Internal Medicine

## 2019-10-07 ENCOUNTER — Other Ambulatory Visit: Payer: Self-pay

## 2019-10-07 ENCOUNTER — Encounter (HOSPITAL_COMMUNITY): Payer: Self-pay

## 2019-10-07 ENCOUNTER — Emergency Department (HOSPITAL_COMMUNITY): Payer: Medicare Other

## 2019-10-07 DIAGNOSIS — Z833 Family history of diabetes mellitus: Secondary | ICD-10-CM

## 2019-10-07 DIAGNOSIS — R0603 Acute respiratory distress: Secondary | ICD-10-CM

## 2019-10-07 DIAGNOSIS — G4733 Obstructive sleep apnea (adult) (pediatric): Secondary | ICD-10-CM | POA: Diagnosis not present

## 2019-10-07 DIAGNOSIS — F329 Major depressive disorder, single episode, unspecified: Secondary | ICD-10-CM | POA: Diagnosis present

## 2019-10-07 DIAGNOSIS — Z7951 Long term (current) use of inhaled steroids: Secondary | ICD-10-CM | POA: Diagnosis not present

## 2019-10-07 DIAGNOSIS — I110 Hypertensive heart disease with heart failure: Secondary | ICD-10-CM | POA: Diagnosis not present

## 2019-10-07 DIAGNOSIS — Z888 Allergy status to other drugs, medicaments and biological substances status: Secondary | ICD-10-CM

## 2019-10-07 DIAGNOSIS — I4510 Unspecified right bundle-branch block: Secondary | ICD-10-CM | POA: Diagnosis not present

## 2019-10-07 DIAGNOSIS — E119 Type 2 diabetes mellitus without complications: Secondary | ICD-10-CM | POA: Diagnosis not present

## 2019-10-07 DIAGNOSIS — F17210 Nicotine dependence, cigarettes, uncomplicated: Secondary | ICD-10-CM | POA: Diagnosis present

## 2019-10-07 DIAGNOSIS — J440 Chronic obstructive pulmonary disease with acute lower respiratory infection: Secondary | ICD-10-CM | POA: Diagnosis not present

## 2019-10-07 DIAGNOSIS — I5042 Chronic combined systolic (congestive) and diastolic (congestive) heart failure: Secondary | ICD-10-CM | POA: Diagnosis present

## 2019-10-07 DIAGNOSIS — E6609 Other obesity due to excess calories: Secondary | ICD-10-CM

## 2019-10-07 DIAGNOSIS — J9601 Acute respiratory failure with hypoxia: Secondary | ICD-10-CM | POA: Diagnosis present

## 2019-10-07 DIAGNOSIS — R05 Cough: Secondary | ICD-10-CM | POA: Diagnosis not present

## 2019-10-07 DIAGNOSIS — I10 Essential (primary) hypertension: Secondary | ICD-10-CM | POA: Diagnosis not present

## 2019-10-07 DIAGNOSIS — I2510 Atherosclerotic heart disease of native coronary artery without angina pectoris: Secondary | ICD-10-CM | POA: Diagnosis not present

## 2019-10-07 DIAGNOSIS — J1282 Pneumonia due to coronavirus disease 2019: Secondary | ICD-10-CM | POA: Diagnosis present

## 2019-10-07 DIAGNOSIS — K219 Gastro-esophageal reflux disease without esophagitis: Secondary | ICD-10-CM | POA: Diagnosis present

## 2019-10-07 DIAGNOSIS — J984 Other disorders of lung: Secondary | ICD-10-CM | POA: Diagnosis not present

## 2019-10-07 DIAGNOSIS — F419 Anxiety disorder, unspecified: Secondary | ICD-10-CM | POA: Diagnosis present

## 2019-10-07 DIAGNOSIS — U071 COVID-19: Secondary | ICD-10-CM | POA: Diagnosis present

## 2019-10-07 DIAGNOSIS — Z8249 Family history of ischemic heart disease and other diseases of the circulatory system: Secondary | ICD-10-CM

## 2019-10-07 DIAGNOSIS — F418 Other specified anxiety disorders: Secondary | ICD-10-CM | POA: Diagnosis not present

## 2019-10-07 DIAGNOSIS — Z7982 Long term (current) use of aspirin: Secondary | ICD-10-CM | POA: Diagnosis not present

## 2019-10-07 DIAGNOSIS — Z8701 Personal history of pneumonia (recurrent): Secondary | ICD-10-CM

## 2019-10-07 DIAGNOSIS — R509 Fever, unspecified: Secondary | ICD-10-CM | POA: Diagnosis not present

## 2019-10-07 DIAGNOSIS — Z87892 Personal history of anaphylaxis: Secondary | ICD-10-CM

## 2019-10-07 DIAGNOSIS — J449 Chronic obstructive pulmonary disease, unspecified: Secondary | ICD-10-CM | POA: Diagnosis present

## 2019-10-07 DIAGNOSIS — E559 Vitamin D deficiency, unspecified: Secondary | ICD-10-CM | POA: Diagnosis present

## 2019-10-07 DIAGNOSIS — I5022 Chronic systolic (congestive) heart failure: Secondary | ICD-10-CM | POA: Diagnosis not present

## 2019-10-07 DIAGNOSIS — E782 Mixed hyperlipidemia: Secondary | ICD-10-CM | POA: Diagnosis present

## 2019-10-07 DIAGNOSIS — M1990 Unspecified osteoarthritis, unspecified site: Secondary | ICD-10-CM | POA: Diagnosis not present

## 2019-10-07 DIAGNOSIS — J129 Viral pneumonia, unspecified: Secondary | ICD-10-CM

## 2019-10-07 DIAGNOSIS — J4489 Other specified chronic obstructive pulmonary disease: Secondary | ICD-10-CM | POA: Diagnosis present

## 2019-10-07 DIAGNOSIS — Z79899 Other long term (current) drug therapy: Secondary | ICD-10-CM

## 2019-10-07 DIAGNOSIS — Z7984 Long term (current) use of oral hypoglycemic drugs: Secondary | ICD-10-CM

## 2019-10-07 DIAGNOSIS — Z880 Allergy status to penicillin: Secondary | ICD-10-CM

## 2019-10-07 DIAGNOSIS — Z6834 Body mass index (BMI) 34.0-34.9, adult: Secondary | ICD-10-CM | POA: Diagnosis not present

## 2019-10-07 DIAGNOSIS — I252 Old myocardial infarction: Secondary | ICD-10-CM

## 2019-10-07 DIAGNOSIS — Z955 Presence of coronary angioplasty implant and graft: Secondary | ICD-10-CM

## 2019-10-07 DIAGNOSIS — Z8261 Family history of arthritis: Secondary | ICD-10-CM

## 2019-10-07 HISTORY — DX: COVID-19: U07.1

## 2019-10-07 LAB — URINALYSIS, ROUTINE W REFLEX MICROSCOPIC
Bacteria, UA: NONE SEEN
Glucose, UA: NEGATIVE mg/dL
Hgb urine dipstick: NEGATIVE
Ketones, ur: NEGATIVE mg/dL
Leukocytes,Ua: NEGATIVE
Nitrite: NEGATIVE
Protein, ur: 30 mg/dL — AB
Specific Gravity, Urine: 1.03 (ref 1.005–1.030)
pH: 7 (ref 5.0–8.0)

## 2019-10-07 LAB — POC SARS CORONAVIRUS 2 AG -  ED: SARS Coronavirus 2 Ag: NEGATIVE

## 2019-10-07 LAB — RESPIRATORY PANEL BY RT PCR (FLU A&B, COVID)
Influenza A by PCR: NEGATIVE
Influenza B by PCR: NEGATIVE
SARS Coronavirus 2 by RT PCR: POSITIVE — AB

## 2019-10-07 LAB — COMPREHENSIVE METABOLIC PANEL
ALT: 48 U/L — ABNORMAL HIGH (ref 0–44)
AST: 77 U/L — ABNORMAL HIGH (ref 15–41)
Albumin: 3.7 g/dL (ref 3.5–5.0)
Alkaline Phosphatase: 121 U/L (ref 38–126)
Anion gap: 14 (ref 5–15)
BUN: 9 mg/dL (ref 6–20)
CO2: 23 mmol/L (ref 22–32)
Calcium: 9 mg/dL (ref 8.9–10.3)
Chloride: 99 mmol/L (ref 98–111)
Creatinine, Ser: 0.74 mg/dL (ref 0.61–1.24)
GFR calc Af Amer: >60 mL/min (ref >60)
GFR calc non Af Amer: >60 mL/min (ref >60)
Glucose, Bld: 118 mg/dL — ABNORMAL HIGH (ref 70–99)
Potassium: 4 mmol/L (ref 3.5–5.1)
Sodium: 136 mmol/L (ref 135–145)
Total Bilirubin: 0.9 mg/dL (ref 0.3–1.2)
Total Protein: 7.4 g/dL (ref 6.5–8.1)

## 2019-10-07 LAB — FIBRINOGEN: Fibrinogen: 520 mg/dL — ABNORMAL HIGH (ref 210–475)

## 2019-10-07 LAB — CBC WITH DIFFERENTIAL/PLATELET
Abs Immature Granulocytes: 0.02 10*3/uL (ref 0.00–0.07)
Basophils Absolute: 0 10*3/uL (ref 0.0–0.1)
Basophils Relative: 0 %
Eosinophils Absolute: 0.1 10*3/uL (ref 0.0–0.5)
Eosinophils Relative: 1 %
HCT: 45.5 % (ref 39.0–52.0)
Hemoglobin: 15.2 g/dL (ref 13.0–17.0)
Immature Granulocytes: 0 %
Lymphocytes Relative: 17 %
Lymphs Abs: 1 10*3/uL (ref 0.7–4.0)
MCH: 31.5 pg (ref 26.0–34.0)
MCHC: 33.4 g/dL (ref 30.0–36.0)
MCV: 94.2 fL (ref 80.0–100.0)
Monocytes Absolute: 0.6 10*3/uL (ref 0.1–1.0)
Monocytes Relative: 10 %
Neutro Abs: 4 10*3/uL (ref 1.7–7.7)
Neutrophils Relative %: 72 %
Platelets: 203 10*3/uL (ref 150–400)
RBC: 4.83 MIL/uL (ref 4.22–5.81)
RDW: 12.9 % (ref 11.5–15.5)
WBC: 5.6 10*3/uL (ref 4.0–10.5)
nRBC: 0 % (ref 0.0–0.2)

## 2019-10-07 LAB — LACTATE DEHYDROGENASE: LDH: 182 U/L (ref 98–192)

## 2019-10-07 LAB — FERRITIN: Ferritin: 169 ng/mL (ref 24–336)

## 2019-10-07 LAB — PROCALCITONIN: Procalcitonin: 0.12 ng/mL

## 2019-10-07 LAB — GLUCOSE, CAPILLARY: Glucose-Capillary: 179 mg/dL — ABNORMAL HIGH (ref 70–99)

## 2019-10-07 LAB — C-REACTIVE PROTEIN: CRP: 4.1 mg/dL — ABNORMAL HIGH (ref <1.0)

## 2019-10-07 LAB — D-DIMER, QUANTITATIVE: D-Dimer, Quant: 0.48 ug/mL-FEU (ref 0.00–0.50)

## 2019-10-07 MED ORDER — SODIUM CHLORIDE 0.9 % IV SOLN
100.0000 mg | Freq: Every day | INTRAVENOUS | Status: AC
  Administered 2019-10-08 – 2019-10-11 (×4): 100 mg via INTRAVENOUS
  Filled 2019-10-07 (×7): qty 20

## 2019-10-07 MED ORDER — ZINC SULFATE 220 (50 ZN) MG PO CAPS
220.0000 mg | ORAL_CAPSULE | Freq: Every day | ORAL | Status: DC
  Administered 2019-10-07 – 2019-10-11 (×5): 220 mg via ORAL
  Filled 2019-10-07 (×5): qty 1

## 2019-10-07 MED ORDER — AMLODIPINE BESYLATE 5 MG PO TABS
10.0000 mg | ORAL_TABLET | Freq: Every day | ORAL | Status: DC
  Administered 2019-10-08 – 2019-10-11 (×4): 10 mg via ORAL
  Filled 2019-10-07 (×4): qty 2

## 2019-10-07 MED ORDER — ONDANSETRON HCL 4 MG PO TABS: 4.0000 mg | ORAL_TABLET | Freq: Four times a day (QID) | ORAL | Status: DC | PRN

## 2019-10-07 MED ORDER — ASCORBIC ACID 500 MG PO TABS
500.0000 mg | ORAL_TABLET | Freq: Every day | ORAL | Status: DC
  Administered 2019-10-07 – 2019-10-11 (×5): 500 mg via ORAL
  Filled 2019-10-07 (×5): qty 1

## 2019-10-07 MED ORDER — UMECLIDINIUM BROMIDE 62.5 MCG/INH IN AEPB
1.0000 | INHALATION_SPRAY | Freq: Every day | RESPIRATORY_TRACT | Status: DC
  Administered 2019-10-09 – 2019-10-11 (×3): 1 via RESPIRATORY_TRACT

## 2019-10-07 MED ORDER — POLYETHYLENE GLYCOL 3350 17 G PO PACK: 17.0000 g | PACK | Freq: Every day | ORAL | Status: DC | PRN

## 2019-10-07 MED ORDER — CITALOPRAM HYDROBROMIDE 20 MG PO TABS
20.0000 mg | ORAL_TABLET | Freq: Every day | ORAL | Status: DC
  Administered 2019-10-08 – 2019-10-11 (×4): 20 mg via ORAL
  Filled 2019-10-07 (×4): qty 1

## 2019-10-07 MED ORDER — ATORVASTATIN CALCIUM 40 MG PO TABS
40.0000 mg | ORAL_TABLET | Freq: Every evening | ORAL | Status: DC
  Administered 2019-10-07 – 2019-10-10 (×4): 40 mg via ORAL
  Filled 2019-10-07 (×4): qty 1

## 2019-10-07 MED ORDER — ONDANSETRON HCL 4 MG/2ML IJ SOLN: 4.0000 mg | Freq: Four times a day (QID) | INTRAMUSCULAR | Status: DC | PRN

## 2019-10-07 MED ORDER — INSULIN ASPART 100 UNIT/ML ~~LOC~~ SOLN
0.0000 [IU] | Freq: Every day | SUBCUTANEOUS | Status: DC
  Administered 2019-10-09: 3 [IU] via SUBCUTANEOUS

## 2019-10-07 MED ORDER — ORAL CARE MOUTH RINSE
15.0000 mL | Freq: Two times a day (BID) | OROMUCOSAL | Status: DC
  Administered 2019-10-07 – 2019-10-10 (×7): 15 mL via OROMUCOSAL

## 2019-10-07 MED ORDER — TIOTROPIUM BROMIDE MONOHYDRATE 2.5 MCG/ACT IN AERS: 1.0000 | INHALATION_SPRAY | Freq: Every day | RESPIRATORY_TRACT | Status: DC

## 2019-10-07 MED ORDER — GLIPIZIDE ER 5 MG PO TB24
5.0000 mg | ORAL_TABLET | Freq: Every day | ORAL | Status: DC
  Administered 2019-10-08 – 2019-10-11 (×4): 5 mg via ORAL
  Filled 2019-10-07 (×4): qty 1

## 2019-10-07 MED ORDER — PANTOPRAZOLE SODIUM 40 MG PO TBEC
40.0000 mg | DELAYED_RELEASE_TABLET | Freq: Every day | ORAL | Status: DC
  Administered 2019-10-08 – 2019-10-11 (×4): 40 mg via ORAL
  Filled 2019-10-07 (×4): qty 1

## 2019-10-07 MED ORDER — ACETAMINOPHEN 325 MG PO TABS
650.0000 mg | ORAL_TABLET | Freq: Four times a day (QID) | ORAL | Status: DC | PRN
  Administered 2019-10-08 – 2019-10-09 (×2): 650 mg via ORAL
  Filled 2019-10-07 (×2): qty 2

## 2019-10-07 MED ORDER — DEXAMETHASONE SODIUM PHOSPHATE 10 MG/ML IJ SOLN
10.0000 mg | Freq: Once | INTRAMUSCULAR | Status: AC
  Administered 2019-10-07: 10 mg via INTRAVENOUS
  Filled 2019-10-07: qty 1

## 2019-10-07 MED ORDER — GUAIFENESIN-DM 100-10 MG/5ML PO SYRP: 10.0000 mL | ORAL_SOLUTION | ORAL | Status: DC | PRN

## 2019-10-07 MED ORDER — INSULIN ASPART 100 UNIT/ML ~~LOC~~ SOLN
0.0000 [IU] | Freq: Three times a day (TID) | SUBCUTANEOUS | Status: DC
  Administered 2019-10-08 (×2): 5 [IU] via SUBCUTANEOUS
  Administered 2019-10-09 (×2): 3 [IU] via SUBCUTANEOUS
  Administered 2019-10-09: 2 [IU] via SUBCUTANEOUS
  Administered 2019-10-10: 5 [IU] via SUBCUTANEOUS
  Administered 2019-10-10: 8 [IU] via SUBCUTANEOUS

## 2019-10-07 MED ORDER — ASPIRIN EC 81 MG PO TBEC
81.0000 mg | DELAYED_RELEASE_TABLET | Freq: Two times a day (BID) | ORAL | Status: DC
  Administered 2019-10-08 – 2019-10-11 (×7): 81 mg via ORAL
  Filled 2019-10-07 (×8): qty 1

## 2019-10-07 MED ORDER — ENOXAPARIN SODIUM 40 MG/0.4ML ~~LOC~~ SOLN
40.0000 mg | SUBCUTANEOUS | Status: DC
  Administered 2019-10-07 – 2019-10-10 (×4): 40 mg via SUBCUTANEOUS
  Filled 2019-10-07 (×4): qty 0.4

## 2019-10-07 MED ORDER — ALBUTEROL SULFATE HFA 108 (90 BASE) MCG/ACT IN AERS: 2.0000 | INHALATION_SPRAY | Freq: Four times a day (QID) | RESPIRATORY_TRACT | Status: DC | PRN

## 2019-10-07 MED ORDER — DEXAMETHASONE SODIUM PHOSPHATE 10 MG/ML IJ SOLN
6.0000 mg | INTRAMUSCULAR | Status: DC
  Administered 2019-10-08 – 2019-10-11 (×4): 6 mg via INTRAVENOUS
  Filled 2019-10-07 (×4): qty 1

## 2019-10-07 MED ORDER — MOMETASONE FURO-FORMOTEROL FUM 200-5 MCG/ACT IN AERO
2.0000 | INHALATION_SPRAY | Freq: Two times a day (BID) | RESPIRATORY_TRACT | Status: DC
  Administered 2019-10-07 – 2019-10-11 (×8): 2 via RESPIRATORY_TRACT
  Filled 2019-10-07: qty 8.8

## 2019-10-07 MED ORDER — SODIUM CHLORIDE 0.9 % IV SOLN
200.0000 mg | Freq: Once | INTRAVENOUS | Status: AC
  Administered 2019-10-07: 200 mg via INTRAVENOUS
  Filled 2019-10-07: qty 40

## 2019-10-07 MED ORDER — INSULIN GLARGINE 100 UNIT/ML ~~LOC~~ SOLN
8.0000 [IU] | Freq: Every day | SUBCUTANEOUS | Status: DC
  Filled 2019-10-07 (×3): qty 0.08

## 2019-10-07 NOTE — ED Provider Notes (Signed)
[facility] Provider Note   CSN: [id number] Arrival date & time: 10/07/19  1220     History Chief Complaint  Patient presents with  . Fever    Randy Hebert is a 54 y.o. male.  The history is provided by the patient. No language interpreter was used.  Cough Cough characteristics:  Non-productive Sputum characteristics:  Nondescript Severity:  Moderate Onset quality:  Gradual Duration:  5 days Timing:  Constant Progression:  Worsening Chronicity:  New Context: sick contacts   Relieved by:  Nothing Worsened by:  Nothing Ineffective treatments:  None tried Associated symptoms: shortness of breath   Associated symptoms: no chest pain   Risk factors: no recent infection   Pt reports he began feeling sick 5 days ago.  Pt reports he has had increasing shortness of breath.  Pt reports his oxygen level at home was below 90%. Pt reports he is not on oxygen.       Past Medical History:  Diagnosis Date  . Allergy   . Angio-edema   . Anxiety   . Arthritis   . Asthma    hip replacement  . Back pain   . Bulging of cervical intervertebral disc   . CAD (coronary artery disease)    lateral STEMI 02/06/2015 00% D1 occlusion treated with Promus Premier 2.5 mm x 16 mm DES, 70% ramus stenosis, 40% mid RCA stenosis, 45% distal RCA stenosis, EF 45-50%  . CHF (congestive heart failure) ([place])   . COPD (chronic obstructive pulmonary disease) ([place])   . Depression   . Diabetes mellitus without complication ([place])   . 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 Dry eye   . Dyspnea   . Early satiety 09/23/2016  . GERD (gastroesophageal reflux disease)   . Headache   . Heart murmur   . Hip pain   . Hyperlipidemia   . Hypertension   . MI (myocardial infarction) ([place])   . Myocardial infarction ([place])   . Neck pain   . Otitis media   . Pleurisy   . Sinus pause    9 sec sinus pause on telemetry after started on coreg after MI,  avoid AV nodal blocking agent  . Sleep apnea   . Substance abuse ([place]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  . Unilateral primary osteoarthritis, right hip 07/01/2016    Patient Active Problem List   Diagnosis Date Noted  . Seasonal and perennial allergic rhinitis 11/10/2018  . Restrictive lung disease 11/10/2018  . Fatty liver 11/08/2018  . Rectal bleeding 11/08/2018  . LLQ pain 08/04/2018  . Diarrhea 08/04/2018  . Melena 08/04/2018  . Barrett's esophagus 08/04/2018  . Transaminitis 08/04/2018  . Weight gain 06/30/2018  . Vitamin D deficiency 06/30/2018  . Alcohol abuse 06/30/2018  . DM type 2 causing vascular disease ([place]) 08/17/2017  . Syncope 06/29/2017  . HCAP (healthcare-associated pneumonia) 05/15/2017  . Uncontrolled type 2 diabetes mellitus with hyperglycemia, without long-term current use of insulin ([place]) 05/15/2017  . Acute on chronic respiratory failure with hypoxia ([place]) 05/15/2017  . Lobar pneumonia ([place]) 05/15/2017  . Chronic combined systolic and diastolic CHF (congestive heart failure) ([place]) 05/15/2017  . Sinus pause 05/15/2017  . Syncope and collapse 05/15/2017  . Bradycardia 04/28/2017  . Sleep apnea 04/28/2017  . Non-ST elevation (NSTEMI) myocardial infarction ([place]) 04/27/2017  . NSTEMI (non-ST elevated myocardial infarction) ([place]) 04/26/2017  . Substance abuse ([place]) 04/15/2017  . Depression with anxiety 04/15/2017  .  GERD (gastroesophageal reflux disease) 09/23/2016  . 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 Asthma-COPD overlap syndrome ([place]) 08/27/2015  . Cigarette nicotine dependence with nicotine-induced disorder 08/27/2015  . Mixed hyperlipidemia 08/27/2015  . Class 2 severe obesity due to excess calories with serious comorbidity and body mass index (BMI) of 35.0 to 35.9 in adult [facility]) 08/27/2015  . Acute on chronic systolic heart failure ([place]) 06/02/2015  . ACE inhibitor-aggravated angioedema   . Chronic systolic CHF (congestive heart failure) ([place])  06/01/2015  . CAD (coronary artery disease) 05/31/2015  . STEMI (ST elevation myocardial infarction) ([place]) 05/31/2015  . Current smoker 05/31/2015  . Essential hypertension, benign 02/09/2015    Past Surgical History:  Procedure Laterality Date  . BIOPSY  10/09/2016   Procedure: BIOPSY;  Surgeon: Daneil Dolin, MD;  [facility];  Service: Endoscopy;;  . CARDIAC CATHETERIZATION N/A 02/06/2015   Procedure: Left Heart Cath and Coronary Angiography;  Surgeon: Leonie Man, MD;  Location: [place] [facility];  Service: Cardiovascular;  Laterality: N/A;  . CARDIAC CATHETERIZATION N/A 02/06/2015   Procedure: Coronary Stent Intervention;  Surgeon: Leonie Man, MD;  Location: [facility] [facility];  Service: Cardiovascular;  Laterality: N/A;  . COLONOSCOPY WITH PROPOFOL N/A 10/09/2016   Procedure: COLONOSCOPY WITH PROPOFOL;  Surgeon: Daneil Dolin, MD;  [facility];  Service: Endoscopy;  Laterality: N/A;  12:00 pm  . COLONOSCOPY WITH PROPOFOL N/A 11/24/2016   Dr. Gala Romney: Multiple colon polyps removed, tubular adenoma.  Next colonoscopy in 5 years.  . CORONARY ANGIOPLASTY WITH STENT PLACEMENT  01/2015  . CORONARY STENT INTERVENTION N/A 04/27/2017   Procedure: CORONARY STENT INTERVENTION;  Surgeon: Nelva Bush, MD;  Location: [place];  Service: Cardiovascular;  Laterality: N/A;  . ELECTROPHYSIOLOGY STUDY N/A 06/29/2017   Procedure: ELECTROPHYSIOLOGY STUDY;  Surgeon: Evans Lance, MD;  Location: [facility];  Service: Cardiovascular;  Laterality: N/A;  . ESOPHAGOGASTRODUODENOSCOPY (EGD) WITH PROPOFOL N/A 10/09/2016   Dr. Gala Romney: LA grade a esophagitis.  Barrett's esophagus, biopsy-proven.  Small hiatal hernia.  EGD February 2019  . INCISION / DRAINAGE HAND / FINGER    . LEFT HEART CATH AND CORONARY ANGIOGRAPHY N/A 04/27/2017   Procedure: LEFT HEART CATH AND CORONARY ANGIOGRAPHY;  Surgeon: Nelva Bush, MD;  Location: [facility];  Service:  Cardiovascular;  Laterality: N/A;  . LOOP RECORDER INSERTION  06/29/2017   Procedure: Loop Recorder Insertion;  Surgeon: Evans Lance, MD;  Location: [place] [facility];  Service: Cardiovascular;;  . POLYPECTOMY  11/24/2016   Procedure: POLYPECTOMY;  Surgeon: Daneil Dolin, MD;  [facility];  Service: Endoscopy;;  descending and sigmoid  . TOTAL HIP ARTHROPLASTY Right 07/01/2016  . TOTAL HIP ARTHROPLASTY Right 07/01/2016   Procedure: RIGHT TOTAL HIP ARTHROPLASTY ANTERIOR APPROACH;  Surgeon: Mcarthur Rossetti, MD;  Location: [facility];  Service: Orthopedics;  Laterality: Right;       Family History  Problem Relation Age of Onset  . Heart attack Father   . Stroke Father   . Arthritis Father   . Heart disease Father   . Cancer Mother        ???  . Arthritis Mother   . Heart disease Brother 16       died in sleep  . Early death Brother   . Diabetes Maternal Uncle   . Alzheimer's disease Maternal Grandmother     Social History   Tobacco  Use  . Smoking status: Current Every Day Smoker    Packs/day: 1.50    Years: 46.00    Pack years: 69.00    Types: Cigarettes  . Smokeless tobacco: Former Systems developer    Types: Chew  Substance Use Topics  . Alcohol use: Yes    Alcohol/week: 0.0 standard drinks    Comment: pint of whiskey daily  . Drug use: No    Comment: history of drug use- marijuana, cocaine- quit 2014    Home Medications Prior to Admission medications   Medication Sig Start Date End Date Taking? Authorizing Provider  acetaminophen (TYLENOL) 325 MG tablet Take 650 mg by mouth every 6 (six) hours as needed for mild pain.    [provider]  albuterol (PROVENTIL) (2.5 MG/3ML) 0.083% nebulizer solution Take 3 mLs (2.5 mg total) by nebulization every 6 (six) hours as needed for wheezing or shortness of breath. 05/27/16   Soyla Dryer, PA-C  amLODipine (NORVASC) 10 MG tablet TAKE 1 TABLET BY MOUTH ONCE A DAY. 02/14/19   Cassandria Anger, MD  aspirin  81 MG EC tablet Take 1 tablet (81 mg total) by mouth daily. Patient taking differently: Take 81 mg by mouth 2 (two) times daily.  04/29/17   Cheryln Manly, NP  atorvastatin (LIPITOR) 40 MG tablet TAKE 1 TABLET BY MOUTH ONCE A DAY. 02/14/19   Cassandria Anger, MD  azelastine (ASTELIN) 0.1 % nasal spray USE 2 SPRAYS IN EACH NOSTRIL TWICE DAILY. 09/19/19   Valentina Shaggy, MD  budesonide-formoterol [facility]) 160-4.5 MCG/ACT inhaler Inhale 2 puffs into the lungs 2 (two) times daily. 07/14/18   Valentina Shaggy, MD  Carboxymeth-Glycerin-Polysorb (REFRESH OPTIVE ADVANCED) 0.5-1-0.5 % SOLN Apply 1 drop to eye 2 (two) times daily as needed (dry eyes).     [provider]  Cholecalciferol (VITAMIN D3) 125 MCG (5000 UT) CAPS Take 1 capsule (5,000 Units total) by mouth daily. 07/19/19   Cassandria Anger, MD  citalopram (CELEXA) 20 MG tablet Take 20 mg by mouth daily. 11/06/17   [provider]  cycloSPORINE (RESTASIS) 0.05 % ophthalmic emulsion Place 1 drop into both eyes 2 (two) times daily.    [provider]  ferrous sulfate 325 (65 FE) MG tablet Take 325 mg by mouth daily with breakfast.    [provider]  furosemide (LASIX) 40 MG tablet TAKE 1 TABLET BY MOUTH ONCE DAILY. 05/04/18   Evans Lance, MD  glipiZIDE (GLUCOTROL XL) 5 MG 24 hr tablet Take 1 tablet (5 mg total) by mouth daily with breakfast. 07/19/19   Nida, Marella Chimes, MD  glucose blood (IGLUCOSE TEST STRIPS) test strip 1 each by Other route 2 (two) times daily. Use as instructed 07/31/17   Raylene Everts, MD  Magnesium 200 MG TABS Take 1 tablet (200 mg total) by mouth daily. 03/06/17   Lendon Colonel, NP  metFORMIN (GLUCOPHAGE) 1000 MG tablet Take 1 tablet (1,000 mg total) by mouth 2 (two) times daily with a meal. 04/15/17   Raylene Everts, MD  mometasone (ELOCON) 0.1 % cream Apply 1 application topically daily.  10/21/18   [provider]  naproxen (NAPROSYN) 500  MG tablet Take 500 mg by mouth as needed.    [provider]  nitroGLYCERIN (NITROSTAT) 0.4 MG SL tablet Place 1 tablet (0.4 mg total) under the tongue every 5 (five) minutes as needed for chest pain. 10/21/17   Raylene Everts, MD  pantoprazole (PROTONIX) 40 MG tablet  TAKE 1 TABLET BY MOUTH ONCE A DAY. 05/24/19   Carlis Stable, NP  PAZEO 0.7 % SOLN  11/19/18   [provider]  PROVENTIL HFA 108 (90 Base) MCG/ACT inhaler INHALE 2 PUFFS INTO THE LUNGS EVERY 6 HOURS AS NEEDED FOR SHORTNESS OF BREATH OR WHEEZING. 08/13/17   Raylene Everts, MD  SPIRIVA RESPIMAT 2.5 MCG/ACT AERS INHALE 1 PUFF INTO THE LUNGS DAILY. 09/19/19   Valentina Shaggy, MD    Allergies    Carvedilol, Lisinopril, and Amoxicillin  Review of Systems   Review of Systems  Respiratory: Positive for cough and shortness of breath.   Cardiovascular: Negative for chest pain.  All other systems reviewed and are negative.   Physical Exam Updated Vital Signs BP 139/71   Pulse 91   Temp 99.2 F (37.3 C) (Oral)   Resp (!) 22   Ht 5\' 5"  (1.651 m)   Wt 93.9 kg   SpO2 91%   BMI 34.45 kg/m   Physical Exam Vitals and nursing note reviewed.  Constitutional:      Appearance: He is well-developed.  HENT:     Head: Normocephalic and atraumatic.  Eyes:     Conjunctiva/sclera: Conjunctivae normal.  Cardiovascular:     Rate and Rhythm: Normal rate and regular rhythm.     Heart sounds: No murmur.  Pulmonary:     Effort: No respiratory distress.     Breath sounds: No wheezing.  Abdominal:     Palpations: Abdomen is soft.     Tenderness: There is no abdominal tenderness.  Musculoskeletal:     Cervical back: Neck supple.  Skin:    General: Skin is warm and dry.  Neurological:     General: No focal deficit present.     Mental Status: He is alert.  Psychiatric:        Mood and Affect: Mood normal.     ED Results / Procedures / Treatments   Labs (all labs ordered are listed, but only abnormal  results are displayed) Labs Reviewed  COMPREHENSIVE METABOLIC PANEL - Abnormal; Notable for the following components:      Result Value   Glucose, Bld 118 (*)    AST 77 (*)    ALT 48 (*)    All other components within normal limits  RESPIRATORY PANEL BY RT PCR (FLU A&B, COVID)  CBC WITH DIFFERENTIAL/PLATELET  POC SARS CORONAVIRUS 2 AG -  ED    EKG EKG Interpretation  Date/Time:  Friday October 07 2019 13:41:27 EST Ventricular Rate:  92 PR Interval:    QRS Duration: 142 QT Interval:  389 QTC Calculation: 482 R Axis:   135 Text Interpretation: Sinus rhythm Right bundle branch block Lateral infarct, old No STEMI, no significant change from Feb 2019 ECG Confirmed by Octaviano Glow [phone]) on 10/07/2019 3:40:36 PM   Radiology DG Chest Portable 1 View  Result Date: 10/07/2019 CLINICAL DATA:  Cough, fever EXAM: PORTABLE CHEST 1 VIEW COMPARISON:  03/03/2019 FINDINGS: Chronic mild interstitial prominence with superimposed patchy density, greater on the left. No pleural effusion or pneumothorax. Stable cardiomediastinal contours. Loop recorder is present. IMPRESSION: Mild patchy density, a greater on the left. This may reflect atypical pneumonia in the appropriate setting. Electronically Signed   By: Macy Mis M.D.   On: 10/07/2019 13:49    Procedures Procedures (including critical care time)  Medications Ordered in ED Medications - No data to display  ED Course  I have reviewed the triage vital signs and  the nursing notes.  Pertinent labs & imaging results that were available during my care of the patient were reviewed by me and considered in my medical decision making (see chart for details).    MDM Rules/Calculators/A&P                      Chest xray shows pneumonia.  Pt's 02 stats 88%. Rapid covid is negative.  Pt placed on 2 liters of 02.  I spoke to Hospitalist who will see for admission  Final Clinical Impression(s) / ED Diagnoses Final diagnoses:  Viral pneumonia    Acute respiratory distress    Rx / DC Orders ED Discharge Orders    None       Sidney Ace 10/07/19 1643    Wyvonnia Dusky, MD 10/07/19 2050

## 2019-10-07 NOTE — ED Notes (Signed)
Patient placed on cardiac monitor, ekg completed, IV access established, and labs drawn.

## 2019-10-07 NOTE — ED Triage Notes (Signed)
Pt presents to ED with complaints of fever up to 100.8 at home, cough, increased SOB, loss of taste and smell started Monday. Pt states his O2 sat at home today was 87%. Pt sat in triage 94%

## 2019-10-07 NOTE — Plan of Care (Signed)
Pt progressing on plan of care. Verbalized understanding

## 2019-10-07 NOTE — ED Notes (Signed)
Date and time results received: 10/07/19 18;51 (use smartphrase ".now" to insert current time)  Test: covid positive  Critical Value: positive  Name of Provider Notified: Brandon Melnick, [place]  Orders Received? Or Actions Taken?: none

## 2019-10-07 NOTE — H&P (Addendum)
History and Physical    Randy Hebert DGL:[id number] DOB: 1966/01/20 DOA: 10/07/2019  PCP: Rosita Fire, MD   Patient coming from: Home  I have personally briefly reviewed patient's old medical records in [place]  Chief Complaint: SOB, cough, fever  HPI: Randy Hebert is a 54 y.o. male with medical history significant for asthma and COPD, DM, systolic and diastolic CHF, restrictive lung disease, substance abuse.  Patient presented to the ED with complaints of 5 days of cough, difficulty breathing.  He recorded a fever of 100.8 at home.  He also reports loss of taste and smell.  Denies known Covid positive contacts.  Patient checked his O2 sats at home and it was 87%.  He denies chest pain.   ED Course: O2 sats down to 88% in ED improved with 2 L O2.  WBC 5.6.  EKG shows old right bundle branch block.  Portable chest x-ray showing mild patchy density L > R, may reflect atypical pneumonia. POC Covid test negative.  Hospitalist called to admit for acute respiratory failure, possible viral pneumonia.  Review of Systems: As per HPI all other systems reviewed and negative.  Past Medical History:  Diagnosis Date  . Allergy   . Angio-edema   . Anxiety   . Arthritis   . Asthma    hip replacement  . Back pain   . Bulging of cervical intervertebral disc   . CAD (coronary artery disease)    lateral STEMI 02/06/2015 00% D1 occlusion treated with Promus Premier 2.5 mm x 16 mm DES, 70% ramus stenosis, 40% mid RCA stenosis, 45% distal RCA stenosis, EF 45-50%  . CHF (congestive heart failure) ([place])   . COPD (chronic obstructive pulmonary disease) ([place])   . Depression   . Diabetes mellitus without complication ([place])   . 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 Dry eye   . Dyspnea   . Early satiety 09/23/2016  . GERD (gastroesophageal reflux disease)   . Headache   . Heart murmur   . Hip pain   . Hyperlipidemia   . Hypertension   . MI  (myocardial infarction) ([place])   . Myocardial infarction ([place])   . Neck pain   . Otitis media   . Pleurisy   . Sinus pause    9 sec sinus pause on telemetry after started on coreg after MI, avoid AV nodal blocking agent  . Sleep apnea   . 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 Unilateral primary osteoarthritis, right hip 07/01/2016    Past Surgical History:  Procedure Laterality Date  . BIOPSY  10/09/2016   Procedure: BIOPSY;  Surgeon: Daneil Dolin, MD;  [facility];  Service: Endoscopy;;  . CARDIAC CATHETERIZATION N/A 02/06/2015   Procedure: Left Heart Cath and Coronary Angiography;  Surgeon: Leonie Man, MD;  Location: [facility];  Service: Cardiovascular;  Laterality: N/A;  . CARDIAC CATHETERIZATION N/A 02/06/2015   Procedure: Coronary Stent Intervention;  Surgeon: Leonie Man, MD;  Location: [facility];  Service: Cardiovascular;  Laterality: N/A;  . COLONOSCOPY WITH PROPOFOL N/A 10/09/2016   Procedure: COLONOSCOPY WITH PROPOFOL;  Surgeon: Daneil Dolin, MD;  [facility];  Service: Endoscopy;  Laterality: N/A;  12:00 pm  . COLONOSCOPY WITH PROPOFOL N/A 11/24/2016   Dr. Gala Romney: Multiple colon polyps removed, tubular adenoma.  Next colonoscopy in 5 years.  . CORONARY ANGIOPLASTY WITH  STENT PLACEMENT  01/2015  . CORONARY STENT INTERVENTION N/A 04/27/2017   Procedure: CORONARY STENT INTERVENTION;  Surgeon: Nelva Bush, MD;  Location: [facility];  Service: Cardiovascular;  Laterality: N/A;  . ELECTROPHYSIOLOGY STUDY N/A 06/29/2017   Procedure: ELECTROPHYSIOLOGY STUDY;  Surgeon: Evans Lance, MD;  Location: [facility];  Service: Cardiovascular;  Laterality: N/A;  . ESOPHAGOGASTRODUODENOSCOPY (EGD) WITH PROPOFOL N/A 10/09/2016   Dr. Gala Romney: LA grade a esophagitis.  Barrett's esophagus, biopsy-proven.  Small hiatal hernia.  EGD February 2019  . INCISION / DRAINAGE HAND / FINGER    . LEFT HEART CATH AND CORONARY ANGIOGRAPHY N/A  04/27/2017   Procedure: LEFT HEART CATH AND CORONARY ANGIOGRAPHY;  Surgeon: Nelva Bush, MD;  Location: [facility];  Service: Cardiovascular;  Laterality: N/A;  . LOOP RECORDER INSERTION  06/29/2017   Procedure: Loop Recorder Insertion;  Surgeon: Evans Lance, MD;  Location: [facility];  Service: Cardiovascular;;  . POLYPECTOMY  11/24/2016   Procedure: POLYPECTOMY;  Surgeon: Daneil Dolin, MD;  [facility];  Service: Endoscopy;;  descending and sigmoid  . TOTAL HIP ARTHROPLASTY Right 07/01/2016  . TOTAL HIP ARTHROPLASTY Right 07/01/2016   Procedure: RIGHT TOTAL HIP ARTHROPLASTY ANTERIOR APPROACH;  Surgeon: Mcarthur Rossetti, MD;  Location: [facility];  Service: Orthopedics;  Laterality: Right;     reports that he has been smoking cigarettes. He has a 69.00 pack-year smoking history. He has quit using smokeless tobacco.  His smokeless tobacco use included chew. He reports current alcohol use. He reports that he does not use drugs.  Allergies  Allergen Reactions  . Carvedilol Other (See Comments)    Sinus pause on telemetry >3 seconds. Longest one 9 sec. No AV nodal agent  . Lisinopril Anaphylaxis, Shortness Of Breath and Swelling    Angioedema, required intubation and mechanical ventilation  . Amoxicillin Nausea And Vomiting    Nausea only - no allergy    Family History  Problem Relation Age of Onset  . Heart attack Father   . Stroke Father   . Arthritis Father   . Heart disease Father   . Cancer Mother        ???  . Arthritis Mother   . Heart disease Brother 43       died in sleep  . Early death Brother   . Diabetes Maternal Uncle   . Alzheimer's disease Maternal Grandmother     Prior to Admission medications   Medication Sig Start Date End Date Taking? Authorizing Provider  acetaminophen (TYLENOL) 325 MG tablet Take 650 mg by mouth every 6 (six) hours as needed for mild pain.    [provider]  albuterol (PROVENTIL) (2.5 MG/3ML)  0.083% nebulizer solution Take 3 mLs (2.5 mg total) by nebulization every 6 (six) hours as needed for wheezing or shortness of breath. 05/27/16   Soyla Dryer, PA-C  amLODipine (NORVASC) 10 MG tablet TAKE 1 TABLET BY MOUTH ONCE A DAY. 02/14/19   Cassandria Anger, MD  aspirin 81 MG EC tablet Take 1 tablet (81 mg total) by mouth daily. Patient taking differently: Take 81 mg by mouth 2 (two) times daily.  04/29/17   Cheryln Manly, NP  atorvastatin (LIPITOR) 40 MG tablet TAKE 1 TABLET BY MOUTH ONCE A DAY. 02/14/19   Cassandria Anger, MD  azelastine (ASTELIN) 0.1 % nasal spray USE 2 SPRAYS IN EACH NOSTRIL TWICE DAILY. 09/19/19   Valentina Shaggy, MD  budesonide-formoterol (  SYMBICORT) 160-4.5 MCG/ACT inhaler Inhale 2 puffs into the lungs 2 (two) times daily. 07/14/18   Valentina Shaggy, MD  Carboxymeth-Glycerin-Polysorb (REFRESH OPTIVE ADVANCED) 0.5-1-0.5 % SOLN Apply 1 drop to eye 2 (two) times daily as needed (dry eyes).     [provider]  Cholecalciferol (VITAMIN D3) 125 MCG (5000 UT) CAPS Take 1 capsule (5,000 Units total) by mouth daily. 07/19/19   Cassandria Anger, MD  citalopram (CELEXA) 20 MG tablet Take 20 mg by mouth daily. 11/06/17   [provider]  cycloSPORINE (RESTASIS) 0.05 % ophthalmic emulsion Place 1 drop into both eyes 2 (two) times daily.    [provider]  ferrous sulfate 325 (65 FE) MG tablet Take 325 mg by mouth daily with breakfast.    [provider]  furosemide (LASIX) 40 MG tablet TAKE 1 TABLET BY MOUTH ONCE DAILY. 05/04/18   Evans Lance, MD  glipiZIDE (GLUCOTROL XL) 5 MG 24 hr tablet Take 1 tablet (5 mg total) by mouth daily with breakfast. 07/19/19   Nida, Marella Chimes, MD  glucose blood (IGLUCOSE TEST STRIPS) test strip 1 each by Other route 2 (two) times daily. Use as instructed 07/31/17   Raylene Everts, MD  Magnesium 200 MG TABS Take 1 tablet (200 mg total) by mouth daily. 03/06/17   Lendon Colonel, NP  metFORMIN (GLUCOPHAGE) 1000 MG tablet Take 1 tablet (1,000 mg total) by mouth 2 (two) times daily with a meal. 04/15/17   Raylene Everts, MD  mometasone (ELOCON) 0.1 % cream Apply 1 application topically daily.  10/21/18   [provider]  naproxen (NAPROSYN) 500 MG tablet Take 500 mg by mouth as needed.    [provider]  nitroGLYCERIN (NITROSTAT) 0.4 MG SL tablet Place 1 tablet (0.4 mg total) under the tongue every 5 (five) minutes as needed for chest pain. 10/21/17   Raylene Everts, MD  pantoprazole (PROTONIX) 40 MG tablet TAKE 1 TABLET BY MOUTH ONCE A DAY. 05/24/19   Carlis Stable, NP  PAZEO 0.7 % SOLN  11/19/18   [provider]  PROVENTIL HFA 108 (90 Base) MCG/ACT inhaler INHALE 2 PUFFS INTO THE LUNGS EVERY 6 HOURS AS NEEDED FOR SHORTNESS OF BREATH OR WHEEZING. 08/13/17   Raylene Everts, MD  SPIRIVA RESPIMAT 2.5 MCG/ACT AERS INHALE 1 PUFF INTO THE LUNGS DAILY. 09/19/19   Valentina Shaggy, MD    Physical Exam: Vitals:   10/07/19 1430 10/07/19 1500 10/07/19 1530 10/07/19 1600  BP: (!) 146/79 (!) 144/81 137/81 139/71  Pulse: 93 90 88 91  Resp: (!) 34 (!) 30 20 (!) 22  Temp:      TempSrc:      SpO2: (!) 88% 91% 92% 91%  Weight:      Height:        Constitutional: NAD, calm, comfortable Vitals:   10/07/19 1430 10/07/19 1500 10/07/19 1530 10/07/19 1600  BP: (!) 146/79 (!) 144/81 137/81 139/71  Pulse: 93 90 88 91  Resp: (!) 34 (!) 30 20 (!) 22  Temp:      TempSrc:      SpO2: (!) 88% 91% 92% 91%  Weight:      Height:       Eyes: PERRL, lids and conjunctivae normal ENMT: Mucous membranes are dry. Neck: normal, supple, no masses, no thyromegaly Respiratory: clear to auscultation bilaterally, no wheezing, no crackles. Normal respiratory effort. No accessory muscle use.  Cardiovascular: Regular rate and rhythm, No  extremity edema. 2+ pedal pulses.  Abdomen:  full, no tenderness, no masses palpated. No hepatosplenomegaly. Bowel sounds  positive.  Musculoskeletal: no clubbing / cyanosis. No joint deformity upper and lower extremities. Good ROM, no contractures. Normal muscle tone.  Skin: no rashes, lesions, ulcers. No induration Neurologic: No gross cranial nerve abnormality, moving all extremities spontaneously.   Psychiatric: Normal judgment and insight. Alert and oriented x 3. Normal mood.   Labs on Admission: I have personally reviewed following labs and imaging studies  CBC: Recent Labs  Lab 10/07/19 1417  WBC 5.6  NEUTROABS 4.0  HGB 15.2  HCT 45.5  MCV 94.2  PLT 376   Basic Metabolic Panel: Recent Labs  Lab 10/07/19 1417  NA 136  K 4.0  CL 99  CO2 23  GLUCOSE 118*  BUN 9  CREATININE 0.74  CALCIUM 9.0   Liver Function Tests: Recent Labs  Lab 10/07/19 1417  AST 77*  ALT 48*  ALKPHOS 121  BILITOT 0.9  PROT 7.4  ALBUMIN 3.7    Radiological Exams on Admission: DG Chest Portable 1 View  Result Date: 10/07/2019 CLINICAL DATA:  Cough, fever EXAM: PORTABLE CHEST 1 VIEW COMPARISON:  03/03/2019 FINDINGS: Chronic mild interstitial prominence with superimposed patchy density, greater on the left. No pleural effusion or pneumothorax. Stable cardiomediastinal contours. Loop recorder is present. IMPRESSION: Mild patchy density, a greater on the left. This may reflect atypical pneumonia in the appropriate setting. Electronically Signed   By: Macy Mis M.D.   On: 10/07/2019 13:49    EKG: Independently reviewed.   Assessment/Plan Principal Problem:   Pneumonia due to COVID-19 virus Active Problems:   Essential hypertension, benign   CAD (coronary artery disease)   Chronic systolic CHF (congestive heart failure) (HCC)   Asthma-COPD overlap syndrome (HCC)   Depression with anxiety   COVID- 19 Pneumonia  With acute respiratory failure -cough, fever, dyspnea, loss of smell and taste, tachypneic.  O2 sats 88% on room air improved with 2 L O2.  Chest x-ray suggesting atypical pneumonia.  WBC 5.6.   Elevation in inflammatory markers CRP, ferritin.  Procalcitonin 0.12. -Remdesivir pharmacy to dose -IV dexamethasone 10 mg x 1, continue 6 mg daily -Check inflammatory markers daily -Daily CBC, CMP -COVID-19 admission protocol -Mucolytic's, inhalers as needed, supplemental oxygen -Multivitamins-zinc, vitamin C  Asthma and COPD, restrictive lung disease-stable without wheezing or rhonchi. -IV steroids, albuterol inhaler - resume home Symbicort, Spiriva  Systolic CHF-stable and compensated.  Echo 04/2017 shows EF of 40 to 45%, G1DD.  Follows with cardiologist, Dr. [name].  Takes Lasix as needed. - Home lasix not resumed, appears mildly dehydrated, encourage po intake.  History of coronary artery disease- STEMI 2016 with DES, 2018 PCI with stent.  No chest pain.  EKG without changes. -Resume home aspirin and statins -Not on beta-blocker or ACE inhibitor due to history of sinus pauses, bradycardia and angioedema  Hypertension-stable. -Resume home Norvasc  Diabetes mellitus-random glucose 118.  07/2019  7.5. - SSI- M -Resume home glipizide, hold Metformin  OSA-on CPAP at home. -Not resumed due to COVID +ve status   DVT prophylaxis: Lovenox Code Status: Full Code Family Communication: None at bedside Disposition Plan: > 2 days, home, pending improvement in respiratory status.  Consults called: none Admission status: Inpatient, telemetry I certify that at the point of admission it is my clinical judgment that the patient will require inpatient hospital care spanning beyond 2 midnights from the point of admission due to high intensity of service, high  risk for further deterioration and high frequency of surveillance required. The following factors support the patient status of inpatient: Acute respiratory failure, IV steroids and antivirals.   Bethena Roys MD [facility]  10/07/2019, 7:07 PM

## 2019-10-08 DIAGNOSIS — I10 Essential (primary) hypertension: Secondary | ICD-10-CM

## 2019-10-08 DIAGNOSIS — I5022 Chronic systolic (congestive) heart failure: Secondary | ICD-10-CM

## 2019-10-08 LAB — CBC WITH DIFFERENTIAL/PLATELET
Abs Immature Granulocytes: 0.02 10*3/uL (ref 0.00–0.07)
Basophils Absolute: 0 10*3/uL (ref 0.0–0.1)
Basophils Relative: 0 %
Eosinophils Absolute: 0 10*3/uL (ref 0.0–0.5)
Eosinophils Relative: 0 %
HCT: 46.8 % (ref 39.0–52.0)
Hemoglobin: 15.4 g/dL (ref 13.0–17.0)
Immature Granulocytes: 1 %
Lymphocytes Relative: 21 %
Lymphs Abs: 0.8 10*3/uL (ref 0.7–4.0)
MCH: 30.7 pg (ref 26.0–34.0)
MCHC: 32.9 g/dL (ref 30.0–36.0)
MCV: 93.4 fL (ref 80.0–100.0)
Monocytes Absolute: 0.4 10*3/uL (ref 0.1–1.0)
Monocytes Relative: 9 %
Neutro Abs: 2.9 10*3/uL (ref 1.7–7.7)
Neutrophils Relative %: 69 %
Platelets: 221 10*3/uL (ref 150–400)
RBC: 5.01 MIL/uL (ref 4.22–5.81)
RDW: 12.5 % (ref 11.5–15.5)
WBC: 4.1 10*3/uL (ref 4.0–10.5)
nRBC: 0 % (ref 0.0–0.2)

## 2019-10-08 LAB — HIV ANTIBODY (ROUTINE TESTING W REFLEX): HIV Screen 4th Generation wRfx: NONREACTIVE

## 2019-10-08 LAB — COMPREHENSIVE METABOLIC PANEL
ALT: 44 U/L (ref 0–44)
AST: 58 U/L — ABNORMAL HIGH (ref 15–41)
Albumin: 3.7 g/dL (ref 3.5–5.0)
Alkaline Phosphatase: 120 U/L (ref 38–126)
Anion gap: 12 (ref 5–15)
BUN: 10 mg/dL (ref 6–20)
CO2: 27 mmol/L (ref 22–32)
Calcium: 9.2 mg/dL (ref 8.9–10.3)
Chloride: 99 mmol/L (ref 98–111)
Creatinine, Ser: 0.64 mg/dL (ref 0.61–1.24)
GFR calc Af Amer: >60 mL/min (ref >60)
GFR calc non Af Amer: >60 mL/min (ref >60)
Glucose, Bld: 176 mg/dL — ABNORMAL HIGH (ref 70–99)
Potassium: 4 mmol/L (ref 3.5–5.1)
Sodium: 138 mmol/L (ref 135–145)
Total Bilirubin: 0.9 mg/dL (ref 0.3–1.2)
Total Protein: 7.8 g/dL (ref 6.5–8.1)

## 2019-10-08 LAB — GLUCOSE, CAPILLARY
Glucose-Capillary: 209 mg/dL — ABNORMAL HIGH (ref 70–99)
Glucose-Capillary: 214 mg/dL — ABNORMAL HIGH (ref 70–99)
Glucose-Capillary: 169 mg/dL — ABNORMAL HIGH (ref 70–99)

## 2019-10-08 LAB — HEMOGLOBIN A1C
Hgb A1c MFr Bld: 6.8 % — ABNORMAL HIGH (ref 4.8–5.6)
Mean Plasma Glucose: 148.46 mg/dL

## 2019-10-08 LAB — FERRITIN: Ferritin: 154 ng/mL (ref 24–336)

## 2019-10-08 LAB — D-DIMER, QUANTITATIVE: D-Dimer, Quant: 0.39 ug/mL-FEU (ref 0.00–0.50)

## 2019-10-08 LAB — C-REACTIVE PROTEIN: CRP: 3.4 mg/dL — ABNORMAL HIGH (ref <1.0)

## 2019-10-08 LAB — ABO/RH: ABO/RH(D): O POS

## 2019-10-08 MED ORDER — UMECLIDINIUM BROMIDE 62.5 MCG/INH IN AEPB
INHALATION_SPRAY | RESPIRATORY_TRACT | Status: AC
  Administered 2019-10-08: 1 via RESPIRATORY_TRACT
  Filled 2019-10-08: qty 7

## 2019-10-08 NOTE — Progress Notes (Signed)
Pt sleeping in bed and receiving medications as ordered. He denies any pain at this time. Bed is low and locked and call bell is within reach.

## 2019-10-08 NOTE — Progress Notes (Signed)
PROGRESS NOTE                                                                                                                                                                                                             Patient Demographics:    Randy Hebert, is a 54 y.o. male, DOB - Jan 23, 1966, PFX:[id number]  Admit date - 10/07/2019   Admitting Physician Ejiroghene Arlyce Dice, MD  Outpatient Primary MD for the patient is Rosita Fire, MD  LOS - 1   Chief Complaint  Patient presents with  . Fever       Brief Narrative     54 y.o. male with medical history significant for asthma and COPD, DM, systolic and diastolic CHF, restrictive lung disease, substance abuse.  Patient presented to the ED with complaints of 5 days of cough, difficulty breathing.  He recorded a fever of 100.8 at home.  He also reports loss of taste and smell.  Denies known Covid positive contacts.  Patient checked his O2 sats at home and it was 87%.  He denies chest pain. In ED  O2 sats down to 88% ,improved with 2 L O2.  Portable chest x-ray showing mild patchy density L > R, may reflect atypical pneumonia. Covid test Positive.     Subjective:    Randy Hebert today ports mild dyspnea, having some chills, and cough .   Assessment  & Plan :    Principal Problem:   Pneumonia due to COVID-19 virus Active Problems:   Essential hypertension, benign   CAD (coronary artery disease)   Chronic systolic CHF (congestive heart failure) (HCC)   Asthma-COPD overlap syndrome (HCC)   Depression with anxiety  COVID- 19 Pneumonia  With acute respiratory failure  -cough, fever, dyspnea, loss of smell and taste, tachypneic.    Still requiring 2 L nasal cannula, chest x-ray significant for tubal opacities . -Continue with IV steroids . -Continue with IV remdesivir . -Continue to trend inflammatory markers closely . -So far I see no indication for convalescent plasma or Actemra, but will  continue to monitor closely . -Patient was encouraged use incentive spirometry, flutter valve, out of bed to chair and modified proning . -Multivitamins-zinc, vitamin C  COVID-19 Labs  Recent Labs    10/07/19 1417 10/08/19 0724  DDIMER 0.48 0.39  FERRITIN 169 154  LDH 182  --   CRP 4.1* 3.4*    Lab Results  Component Value Date   SARSCOV2NAA POSITIVE (A) 10/07/2019   Asthma and COPD, restrictive lung disease -stable without wheezing or rhonchi. -IV steroids, albuterol inhaler - resume home Symbicort, Spiriva  Systolic CHF -stable and compensated.  Echo 04/2017 shows EF of 40 to 45%, G1DD.  Follows with cardiologist, Dr. [name].  Takes Lasix as needed. - Home lasix not resumed, appears mildly dehydrated, encourage po intake.  History of coronary artery disease- STEMI 2016 with DES, 2018 PCI with stent.  No chest pain.  EKG without changes. -Resume home aspirin and statins -Not on beta-blocker or ACE inhibitor due to history of sinus pauses, bradycardia and angioedema  Hypertension-stable. -Resume home Norvasc  Diabetes mellitus-random glucose 118.  07/2019  7.5. - SSI- M -Resume home glipizide, hold Metformin  OSA-on CPAP at home. -Not resumed due to COVID +ve status   Code Status : Full  Family Communication  : Patient coherent, awake and alert, appropriate, discussed with patient  Disposition Plan  : Home  Barriers For Discharge : Remains hypoxic, requiring oxygen, IV steroids and IV remdesivir  Consults  :  None  Procedures  : None  DVT Prophylaxis  :  [place] lovenox  Lab Results  Component Value Date   PLT 221 10/08/2019    Antibiotics  :    Anti-infectives (From admission, onward)   Start     Dose/Rate Route Frequency Ordered Stop   10/08/19 1000  remdesivir 100 mg in sodium chloride 0.9 % 100 mL IVPB     100 mg 200 mL/hr over 30 Minutes Intravenous Daily 10/07/19 1905 10/12/19 0959   10/07/19 2000  remdesivir 200 mg in sodium chloride 0.9%  250 mL IVPB     200 mg 580 mL/hr over 30 Minutes Intravenous Once 10/07/19 1905 10/08/19 0003        Objective:   Vitals:   10/07/19 1930 10/07/19 2050 10/08/19 0500 10/08/19 0832  BP: 137/83 135/86 102/83   Pulse: 81 83 77   Resp: (!) 22 19 20    Temp:  98.1 F (36.7 C) 98.3 F (36.8 C)   TempSrc:  Oral Oral   SpO2: 91% 92% 97% (!) 87%  Weight:  94.1 kg    Height:  5\' 5"  (1.651 m)      Wt Readings from Last 3 Encounters:  10/07/19 94.1 kg  10/04/19 93.9 kg  09/01/19 94.9 kg     Intake/Output Summary (Last 24 hours) at 10/08/2019 1450 Last data filed at 10/07/2019 2100 Gross per 24 hour  Intake 240 ml  Output --  Net 240 ml     Physical Exam  Awake Alert, Oriented X 3, No new F.N deficits, Normal affect Symmetrical Chest wall movement, Good air movement bilaterally, CTAB RRR,No Gallops,Rubs or new Murmurs, No Parasternal Heave +ve B.Sounds, Abd Soft, No tenderness, No rebound - guarding or rigidity. No Cyanosis, Clubbing or edema, No new Rash or bruise      Data Review:    CBC Recent Labs  Lab 10/07/19 1417 10/08/19 0724  WBC 5.6 4.1  HGB 15.2 15.4  HCT 45.5 46.8  PLT 203 221  MCV 94.2 93.4  MCH 31.5 30.7  MCHC 33.4 32.9  RDW 12.9 12.5  LYMPHSABS 1.0 0.8  MONOABS 0.6 0.4  EOSABS 0.1 0.0  BASOSABS 0.0 0.0    Chemistries  Recent Labs  Lab 10/07/19 1417 10/08/19 0724  NA 136 138  K  4.0 4.0  CL 99 99  CO2 23 27  GLUCOSE 118* 176*  BUN 9 10  CREATININE 0.74 0.64  CALCIUM 9.0 9.2  AST 77* 58*  ALT 48* 44  ALKPHOS 121 120  BILITOT 0.9 0.9   ------------------------------------------------------------------------------------------------------------------ No results for input(s): CHOL, HDL, LDLCALC, TRIG, CHOLHDL, LDLDIRECT in the last 72 hours.  Lab Results  Component Value Date   HGBA1C 6.8 (H) 10/07/2019   ------------------------------------------------------------------------------------------------------------------ No results for  input(s): TSH, T4TOTAL, T3FREE, THYROIDAB in the last 72 hours.  Invalid input(s): FREET3 ------------------------------------------------------------------------------------------------------------------ Recent Labs    10/07/19 1417 10/08/19 0724  FERRITIN 169 154    Coagulation profile No results for input(s): INR, PROTIME in the last 168 hours.  Recent Labs    10/07/19 1417 10/08/19 0724  DDIMER 0.48 0.39    Cardiac Enzymes No results for input(s): CKMB, TROPONINI, MYOGLOBIN in the last 168 hours.  Invalid input(s): CK ------------------------------------------------------------------------------------------------------------------    Component Value Date/Time   BNP 32.6 04/15/2017 0927    Inpatient Medications  Scheduled Meds: . amLODipine  10 mg Oral Daily  . vitamin C  500 mg Oral Daily  . aspirin EC  81 mg Oral BID  . atorvastatin  40 mg Oral QPM  . citalopram  20 mg Oral Daily  . dexamethasone (DECADRON) injection  6 mg Intravenous Q24H  . enoxaparin (LOVENOX) injection  40 mg Subcutaneous Q24H  . glipiZIDE  5 mg Oral Q breakfast  . insulin aspart  0-15 Units Subcutaneous TID WC  . insulin aspart  0-5 Units Subcutaneous QHS  . mouth rinse  15 mL Mouth Rinse BID  . mometasone-formoterol  2 puff Inhalation BID  . pantoprazole  40 mg Oral Daily  . umeclidinium bromide  1 puff Inhalation Daily  . zinc sulfate  220 mg Oral Daily   Continuous Infusions: . remdesivir 100 mg in NS 100 mL 100 mg (10/08/19 1227)   PRN Meds:.acetaminophen, albuterol, guaiFENesin-dextromethorphan, ondansetron **OR** ondansetron (ZOFRAN) IV, polyethylene glycol  Micro Results Recent Results (from the past 240 hour(s))  Respiratory Panel by RT PCR (Flu A&B, Covid) - Nasopharyngeal Swab     Status: Abnormal   Collection Time: 10/07/19  3:21 PM   Specimen: Nasopharyngeal Swab  Result Value Ref Range Status   SARS Coronavirus 2 by RT PCR POSITIVE (A) NEGATIVE Final    Comment:  RESULT CALLED TO, READ BACK BY AND VERIFIED WITH: POINTDEXTER,M. AT 1850 ON 10/07/2019 BY EVA (NOTE) SARS-CoV-2 target nucleic acids are DETECTED. SARS-CoV-2 RNA is generally detectable in upper respiratory specimens  during the acute phase of infection. Positive results are indicative of the presence of the identified virus, but do not rule out bacterial infection or co-infection with other pathogens not detected by the test. Clinical correlation with patient history and other diagnostic information is necessary to determine patient infection status. The expected result is Negative. Fact Sheet for Patients:  PinkCheek.be Fact Sheet for Healthcare Providers: GravelBags.it This test is not yet approved or cleared by the [place] FDA and  has been authorized for detection and/or diagnosis of SARS-CoV-2 by FDA under an Emergency Use Authorization (EUA).  This EUA will remain in effect (meaning this test can be  used) for the duration of  the COVID-19 declaration under Section 564(b)(1) of the Act, 21 U.S.C. section 360bbb-3(b)(1), unless the authorization is terminated or revoked sooner.    Influenza A by PCR NEGATIVE NEGATIVE Final   Influenza B by PCR NEGATIVE NEGATIVE Final  Comment: (NOTE) The Xpert Xpress SARS-CoV-2/FLU/RSV assay is intended as an aid in  the diagnosis of influenza from Nasopharyngeal swab specimens and  should not be used as a sole basis for treatment. Nasal washings and  aspirates are unacceptable for Xpert Xpress SARS-CoV-2/FLU/RSV  testing. Fact Sheet for Patients: PinkCheek.be Fact Sheet for Healthcare Providers: GravelBags.it This test is not yet approved or cleared by the [place] FDA and  has been authorized for detection and/or diagnosis of SARS-CoV-2 by  FDA under an Emergency Use Authorization (EUA). This EUA will remain  in  effect (meaning this test can be used) for the duration of the  Covid-19 declaration under Section 564(b)(1) of the Act, 21  U.S.C. section 360bbb-3(b)(1), unless the authorization is  terminated or revoked. Performed at [facility], [address], [place], [place] [address]     Radiology Reports DG Chest Portable 1 View  Result Date: 10/07/2019 CLINICAL DATA:  Cough, fever EXAM: PORTABLE CHEST 1 VIEW COMPARISON:  03/03/2019 FINDINGS: Chronic mild interstitial prominence with superimposed patchy density, greater on the left. No pleural effusion or pneumothorax. Stable cardiomediastinal contours. Loop recorder is present. IMPRESSION: Mild patchy density, a greater on the left. This may reflect atypical pneumonia in the appropriate setting. Electronically Signed   By: Macy Mis M.D.   On: 10/07/2019 13:49   CUP PACEART REMOTE DEVICE CHECK  Result Date: 09/26/2019 [facility] summary report received. Battery status OK. Normal device function. No new symptom episodes, tachy episodes, brady, or pause episodes. No new AF episodes. Monthly summary reports and ROV/PRN     Phillips Climes M.D on 10/08/2019 at 2:50 PM  Between 7am to 7pm - Pager - [phone]  After 7pm go to [email or web address] - password [facility]  [facility] -  Office  [phone]

## 2019-10-09 DIAGNOSIS — R0603 Acute respiratory distress: Secondary | ICD-10-CM

## 2019-10-09 LAB — COMPREHENSIVE METABOLIC PANEL
ALT: 36 U/L (ref 0–44)
AST: 39 U/L (ref 15–41)
Albumin: 3.5 g/dL (ref 3.5–5.0)
Alkaline Phosphatase: 104 U/L (ref 38–126)
Anion gap: 12 (ref 5–15)
BUN: 13 mg/dL (ref 6–20)
CO2: 26 mmol/L (ref 22–32)
Calcium: 9.3 mg/dL (ref 8.9–10.3)
Chloride: 99 mmol/L (ref 98–111)
Creatinine, Ser: 0.59 mg/dL — ABNORMAL LOW (ref 0.61–1.24)
GFR calc Af Amer: >60 mL/min (ref >60)
GFR calc non Af Amer: >60 mL/min (ref >60)
Glucose, Bld: 143 mg/dL — ABNORMAL HIGH (ref 70–99)
Potassium: 4 mmol/L (ref 3.5–5.1)
Sodium: 137 mmol/L (ref 135–145)
Total Bilirubin: 0.9 mg/dL (ref 0.3–1.2)
Total Protein: 7.3 g/dL (ref 6.5–8.1)

## 2019-10-09 LAB — CBC WITH DIFFERENTIAL/PLATELET
Abs Immature Granulocytes: 0.04 10*3/uL (ref 0.00–0.07)
Basophils Absolute: 0 10*3/uL (ref 0.0–0.1)
Basophils Relative: 0 %
Eosinophils Absolute: 0 10*3/uL (ref 0.0–0.5)
Eosinophils Relative: 0 %
HCT: 44.6 % (ref 39.0–52.0)
Hemoglobin: 14.9 g/dL (ref 13.0–17.0)
Immature Granulocytes: 1 %
Lymphocytes Relative: 16 %
Lymphs Abs: 1.3 10*3/uL (ref 0.7–4.0)
MCH: 30.8 pg (ref 26.0–34.0)
MCHC: 33.4 g/dL (ref 30.0–36.0)
MCV: 92.3 fL (ref 80.0–100.0)
Monocytes Absolute: 0.7 10*3/uL (ref 0.1–1.0)
Monocytes Relative: 8 %
Neutro Abs: 6.3 10*3/uL (ref 1.7–7.7)
Neutrophils Relative %: 75 %
Platelets: 270 10*3/uL (ref 150–400)
RBC: 4.83 MIL/uL (ref 4.22–5.81)
RDW: 12.4 % (ref 11.5–15.5)
WBC: 8.3 10*3/uL (ref 4.0–10.5)
nRBC: 0 % (ref 0.0–0.2)

## 2019-10-09 LAB — GLUCOSE, CAPILLARY
Glucose-Capillary: 130 mg/dL — ABNORMAL HIGH (ref 70–99)
Glucose-Capillary: 189 mg/dL — ABNORMAL HIGH (ref 70–99)
Glucose-Capillary: 178 mg/dL — ABNORMAL HIGH (ref 70–99)
Glucose-Capillary: 251 mg/dL — ABNORMAL HIGH (ref 70–99)

## 2019-10-09 LAB — D-DIMER, QUANTITATIVE: D-Dimer, Quant: 0.37 ug/mL-FEU (ref 0.00–0.50)

## 2019-10-09 LAB — C-REACTIVE PROTEIN: CRP: 0.6 mg/dL (ref <1.0)

## 2019-10-09 NOTE — Progress Notes (Signed)
PROGRESS NOTE                                                                                                                                                                                                             Patient Demographics:    Randy Hebert, is a 54 y.o. male, DOB - April 26, 1966, UEA:[id number]  Admit date - 10/07/2019   Admitting Physician Ejiroghene Arlyce Dice, MD  Outpatient Primary MD for the patient is Rosita Fire, MD  LOS - 2   Chief Complaint  Patient presents with  . Fever       Brief Narrative     54 y.o. male with medical history significant for asthma and COPD, DM, systolic and diastolic CHF, restrictive lung disease, substance abuse.  Patient presented to the ED with complaints of 5 days of cough, difficulty breathing.  He recorded a fever of 100.8 at home.  He also reports loss of taste and smell.  Denies known Covid positive contacts.  Patient checked his O2 sats at home and it was 87%.  He denies chest pain. In ED  O2 sats down to 88% ,improved with 2 L O2.  Portable chest x-ray showing mild patchy density L > R, may reflect atypical pneumonia. Covid test Positive.     Subjective:    Randy Hebert today ports mild dyspnea, having some chills, and cough .   Assessment  & Plan :    Principal Problem:   Pneumonia due to COVID-19 virus Active Problems:   Essential hypertension, benign   CAD (coronary artery disease)   Chronic systolic CHF (congestive heart failure) (HCC)   Asthma-COPD overlap syndrome (HCC)   Depression with anxiety  COVID- 19 Pneumonia  With acute respiratory failure  -cough, fever, dyspnea, loss of smell and taste, tachypneic.  Remains on 2 L nasal cannula this morning, chest x-ray significant for multiple opacities, but patient report his dyspnea has improved, as well cough has improved. -Continue with IV steroids . -Continue with IV remdesivir day 3/5. -Continue to trend inflammatory  markers closely . -So far I see no indication for convalescent plasma or Actemra, but will continue to monitor closely . -Patient was encouraged use incentive spirometry, flutter valve, out of bed to chair and modified proning . -Multivitamins-zinc, vitamin C  COVID-19 Labs  Recent Labs    10/07/19  1417 10/08/19 0724 10/09/19 0634  DDIMER 0.48 0.39 0.37  FERRITIN 169 154  --   LDH 182  --   --   CRP 4.1* 3.4* 0.6    Lab Results  Component Value Date   SARSCOV2NAA POSITIVE (A) 10/07/2019   Asthma and COPD, restrictive lung disease -stable without wheezing or rhonchi. -IV steroids, albuterol inhaler - resume home Symbicort, Spiriva  Systolic CHF -stable and compensated.  Echo 04/2017 shows EF of 40 to 45%, G1DD.  Follows with cardiologist, Dr. [name].  Takes Lasix as needed. - Home lasix not resumed, appears mildly dehydrated, encourage po intake.  History of coronary artery disease- STEMI 2016 with DES, 2018 PCI with stent.  No chest pain.  EKG without changes. -Resume home aspirin and statins -Not on beta-blocker or ACE inhibitor due to history of sinus pauses, bradycardia and angioedema  Hypertension-stable. -Resume home Norvasc  Diabetes mellitus-random glucose 118.  07/2019  7.5. - SSI- M -Resume home glipizide, hold Metformin  OSA-on CPAP at home. -Not resumed due to COVID +ve status   Code Status : Full  Family Communication  : Patient coherent, awake and alert, appropriate, discussed with patient  Disposition Plan  : Home  Barriers For Discharge : Remains hypoxic, requiring oxygen, IV steroids and IV remdesivir  Consults  :  None  Procedures  : None  DVT Prophylaxis  :  [place] lovenox  Lab Results  Component Value Date   PLT 270 10/09/2019    Antibiotics  :    Anti-infectives (From admission, onward)   Start     Dose/Rate Route Frequency Ordered Stop   10/08/19 1000  remdesivir 100 mg in sodium chloride 0.9 % 100 mL IVPB     100 mg 200  mL/hr over 30 Minutes Intravenous Daily 10/07/19 1905 10/12/19 0959   10/07/19 2000  remdesivir 200 mg in sodium chloride 0.9% 250 mL IVPB     200 mg 580 mL/hr over 30 Minutes Intravenous Once 10/07/19 1905 10/08/19 0003        Objective:   Vitals:   10/08/19 1617 10/08/19 2143 10/09/19 0544 10/09/19 0857  BP: 128/87 123/78 122/84 125/79  Pulse: 70 66 67   Resp: 20 20 18    Temp: 97.7 F (36.5 C) 97.6 F (36.4 C) 97.8 F (36.6 C)   TempSrc: Oral Oral    SpO2: 95% 94% 95%   Weight:      Height:        Wt Readings from Last 3 Encounters:  10/07/19 94.1 kg  10/04/19 93.9 kg  09/01/19 94.9 kg    No intake or output data in the 24 hours ending 10/09/19 1408   Physical Exam  Awake Alert, Oriented X 3, No new F.N deficits, Normal affect Symmetrical Chest wall movement, Good air movement bilaterally, CTAB RRR,No Gallops,Rubs or new Murmurs, No Parasternal Heave +ve B.Sounds, Abd Soft, No tenderness, No rebound - guarding or rigidity. No Cyanosis, Clubbing or edema, No new Rash or bruise       Data Review:    CBC Recent Labs  Lab 10/07/19 1417 10/08/19 0724 10/09/19 0634  WBC 5.6 4.1 8.3  HGB 15.2 15.4 14.9  HCT 45.5 46.8 44.6  PLT 203 221 270  MCV 94.2 93.4 92.3  MCH 31.5 30.7 30.8  MCHC 33.4 32.9 33.4  RDW 12.9 12.5 12.4  LYMPHSABS 1.0 0.8 1.3  MONOABS 0.6 0.4 0.7  EOSABS 0.1 0.0 0.0  BASOSABS 0.0 0.0 0.0    Chemistries  Recent Labs  Lab 10/07/19 1417 10/08/19 0724 10/09/19 0634  NA 136 138 137  K 4.0 4.0 4.0  CL 99 99 99  CO2 23 27 26   GLUCOSE 118* 176* 143*  BUN 9 10 13   CREATININE 0.74 0.64 0.59*  CALCIUM 9.0 9.2 9.3  AST 77* 58* 39  ALT 48* 44 36  ALKPHOS 121 120 104  BILITOT 0.9 0.9 0.9   ------------------------------------------------------------------------------------------------------------------ No results for input(s): CHOL, HDL, LDLCALC, TRIG, CHOLHDL, LDLDIRECT in the last 72 hours.  Lab Results  Component Value Date    HGBA1C 6.8 (H) 10/07/2019   ------------------------------------------------------------------------------------------------------------------ No results for input(s): TSH, T4TOTAL, T3FREE, THYROIDAB in the last 72 hours.  Invalid input(s): FREET3 ------------------------------------------------------------------------------------------------------------------ Recent Labs    10/07/19 1417 10/08/19 0724  FERRITIN 169 154    Coagulation profile No results for input(s): INR, PROTIME in the last 168 hours.  Recent Labs    10/08/19 0724 10/09/19 0634  DDIMER 0.39 0.37    Cardiac Enzymes No results for input(s): CKMB, TROPONINI, MYOGLOBIN in the last 168 hours.  Invalid input(s): CK ------------------------------------------------------------------------------------------------------------------    Component Value Date/Time   BNP 32.6 04/15/2017 0927    Inpatient Medications  Scheduled Meds: . amLODipine  10 mg Oral Daily  . vitamin C  500 mg Oral Daily  . aspirin EC  81 mg Oral BID  . atorvastatin  40 mg Oral QPM  . citalopram  20 mg Oral Daily  . dexamethasone (DECADRON) injection  6 mg Intravenous Q24H  . enoxaparin (LOVENOX) injection  40 mg Subcutaneous Q24H  . glipiZIDE  5 mg Oral Q breakfast  . insulin aspart  0-15 Units Subcutaneous TID WC  . insulin aspart  0-5 Units Subcutaneous QHS  . mouth rinse  15 mL Mouth Rinse BID  . mometasone-formoterol  2 puff Inhalation BID  . pantoprazole  40 mg Oral Daily  . umeclidinium bromide  1 puff Inhalation Daily  . zinc sulfate  220 mg Oral Daily   Continuous Infusions: . remdesivir 100 mg in NS 100 mL 100 mg (10/09/19 0904)   PRN Meds:.acetaminophen, albuterol, guaiFENesin-dextromethorphan, ondansetron **OR** ondansetron (ZOFRAN) IV, polyethylene glycol  Micro Results Recent Results (from the past 240 hour(s))  Respiratory Panel by RT PCR (Flu A&B, Covid) - Nasopharyngeal Swab     Status: Abnormal   Collection  Time: 10/07/19  3:21 PM   Specimen: Nasopharyngeal Swab  Result Value Ref Range Status   SARS Coronavirus 2 by RT PCR POSITIVE (A) NEGATIVE Final    Comment: RESULT CALLED TO, READ BACK BY AND VERIFIED WITH: POINTDEXTER,M. AT 1850 ON 10/07/2019 BY EVA (NOTE) SARS-CoV-2 target nucleic acids are DETECTED. SARS-CoV-2 RNA is generally detectable in upper respiratory specimens  during the acute phase of infection. Positive results are indicative of the presence of the identified virus, but do not rule out bacterial infection or co-infection with other pathogens not detected by the test. Clinical correlation with patient history and other diagnostic information is necessary to determine patient infection status. The expected result is Negative. Fact Sheet for Patients:  PinkCheek.be Fact Sheet for Healthcare Providers: GravelBags.it This test is not yet approved or cleared by the [place] FDA and  has been authorized for detection and/or diagnosis of SARS-CoV-2 by FDA under an Emergency Use Authorization (EUA).  This EUA will remain in effect (meaning this test can be  used) for the duration of  the COVID-19 declaration under Section 564(b)(1) of the Act, 21 U.S.C. section 360bbb-3(b)(1), unless the  authorization is terminated or revoked sooner.    Influenza A by PCR NEGATIVE NEGATIVE Final   Influenza B by PCR NEGATIVE NEGATIVE Final    Comment: (NOTE) The Xpert Xpress SARS-CoV-2/FLU/RSV assay is intended as an aid in  the diagnosis of influenza from Nasopharyngeal swab specimens and  should not be used as a sole basis for treatment. Nasal washings and  aspirates are unacceptable for Xpert Xpress SARS-CoV-2/FLU/RSV  testing. Fact Sheet for Patients: PinkCheek.be Fact Sheet for Healthcare Providers: GravelBags.it This test is not yet approved or cleared by the [place] FDA and  has been authorized for detection and/or diagnosis of SARS-CoV-2 by  FDA under an Emergency Use Authorization (EUA). This EUA will remain  in effect (meaning this test can be used) for the duration of the  Covid-19 declaration under Section 564(b)(1) of the Act, 21  U.S.C. section 360bbb-3(b)(1), unless the authorization is  terminated or revoked. Performed at [facility], [address] [place], [place] [address]     Radiology Reports DG Chest Portable 1 View  Result Date: 10/07/2019 CLINICAL DATA:  Cough, fever EXAM: PORTABLE CHEST 1 VIEW COMPARISON:  03/03/2019 FINDINGS: Chronic mild interstitial prominence with superimposed patchy density, greater on the left. No pleural effusion or pneumothorax. Stable cardiomediastinal contours. Loop recorder is present. IMPRESSION: Mild patchy density, a greater on the left. This may reflect atypical pneumonia in the appropriate setting. Electronically Signed   By: Macy Mis M.D.   On: 10/07/2019 13:49   CUP PACEART REMOTE DEVICE CHECK  Result Date: 09/26/2019 Carelink summary report received. Battery status OK. Normal device function. No new symptom episodes, tachy episodes, brady, or pause episodes. No new AF episodes. Monthly summary reports and ROV/PRN     Phillips Climes M.D on 10/09/2019 at 2:08 PM  Between 7am to 7pm - Pager - [phone]  After 7pm go to [email or web address] - password [email or web address]  [facility] -  Office  [phone]

## 2019-10-10 LAB — COMPREHENSIVE METABOLIC PANEL
ALT: 53 U/L — ABNORMAL HIGH (ref 0–44)
AST: 73 U/L — ABNORMAL HIGH (ref 15–41)
Albumin: 3.6 g/dL (ref 3.5–5.0)
Alkaline Phosphatase: 103 U/L (ref 38–126)
Anion gap: 10 (ref 5–15)
BUN: 12 mg/dL (ref 6–20)
CO2: 29 mmol/L (ref 22–32)
Calcium: 9.2 mg/dL (ref 8.9–10.3)
Chloride: 100 mmol/L (ref 98–111)
Creatinine, Ser: 0.76 mg/dL (ref 0.61–1.24)
GFR calc Af Amer: >60 mL/min (ref >60)
GFR calc non Af Amer: >60 mL/min (ref >60)
Glucose, Bld: 116 mg/dL — ABNORMAL HIGH (ref 70–99)
Potassium: 4.2 mmol/L (ref 3.5–5.1)
Sodium: 139 mmol/L (ref 135–145)
Total Bilirubin: 0.8 mg/dL (ref 0.3–1.2)
Total Protein: 7.3 g/dL (ref 6.5–8.1)

## 2019-10-10 LAB — CBC WITH DIFFERENTIAL/PLATELET
Abs Immature Granulocytes: 0.04 10*3/uL (ref 0.00–0.07)
Basophils Absolute: 0 10*3/uL (ref 0.0–0.1)
Basophils Relative: 0 %
Eosinophils Absolute: 0 10*3/uL (ref 0.0–0.5)
Eosinophils Relative: 0 %
HCT: 46 % (ref 39.0–52.0)
Hemoglobin: 15.2 g/dL (ref 13.0–17.0)
Immature Granulocytes: 1 %
Lymphocytes Relative: 21 %
Lymphs Abs: 1.6 10*3/uL (ref 0.7–4.0)
MCH: 30.8 pg (ref 26.0–34.0)
MCHC: 33 g/dL (ref 30.0–36.0)
MCV: 93.1 fL (ref 80.0–100.0)
Monocytes Absolute: 0.7 10*3/uL (ref 0.1–1.0)
Monocytes Relative: 9 %
Neutro Abs: 5.4 10*3/uL (ref 1.7–7.7)
Neutrophils Relative %: 69 %
Platelets: 312 10*3/uL (ref 150–400)
RBC: 4.94 MIL/uL (ref 4.22–5.81)
RDW: 12.5 % (ref 11.5–15.5)
WBC: 7.8 10*3/uL (ref 4.0–10.5)
nRBC: 0 % (ref 0.0–0.2)

## 2019-10-10 LAB — C-REACTIVE PROTEIN: CRP: 0.6 mg/dL (ref <1.0)

## 2019-10-10 LAB — GLUCOSE, CAPILLARY
Glucose-Capillary: 109 mg/dL — ABNORMAL HIGH (ref 70–99)
Glucose-Capillary: 272 mg/dL — ABNORMAL HIGH (ref 70–99)
Glucose-Capillary: 244 mg/dL — ABNORMAL HIGH (ref 70–99)
Glucose-Capillary: 131 mg/dL — ABNORMAL HIGH (ref 70–99)

## 2019-10-10 LAB — D-DIMER, QUANTITATIVE: D-Dimer, Quant: 0.28 ug/mL-FEU (ref 0.00–0.50)

## 2019-10-10 NOTE — Progress Notes (Signed)
PROGRESS NOTE                                                                                                                                                                                                             Patient Demographics:    Randy Hebert, is a 54 y.o. male, DOB - 24-Jan-1966, FTD:[id number]  Admit date - 10/07/2019   Admitting Physician Randy Arlyce Dice, MD  Outpatient Primary MD for the patient is Randy Fire, MD  LOS - 3   Chief Complaint  Patient presents with  . Fever       Brief Narrative     54 y.o. male with medical history significant for asthma and COPD, DM, systolic and diastolic CHF, restrictive lung disease, substance abuse.  Patient presented to the ED with complaints of 5 days of cough, difficulty breathing.  He recorded a fever of 100.8 at home.  He also reports loss of taste and smell.  Denies known Covid positive contacts.  Patient checked his O2 sats at home and it was 87%.  He denies chest pain. In ED  O2 sats down to 88% ,improved with 2 L O2.  Portable chest x-ray showing mild patchy density L > R, may reflect atypical pneumonia. Covid test Positive.     Subjective:    Randy Hebert today ports mild dyspnea, having some chills, and cough .   Assessment  & Plan :    Principal Problem:   Pneumonia due to COVID-19 virus Active Problems:   Essential hypertension, benign   CAD (coronary artery disease)   Chronic systolic CHF (congestive heart failure) (HCC)   Asthma-COPD overlap syndrome (HCC)   Depression with anxiety  COVID- 19 Pneumonia  With acute respiratory failure  -cough, fever, dyspnea, loss of smell and taste, tachypneic, chest x-ray significant for multiple opacities, patient report his dyspnea has improved, as well cough has improved, been on room air this morning, will ambulate to see if any oxygen requirement with activity to arrange for tomorrow's discharge -Continue with IV steroids  . -Continue with IV remdesivir day 4/5. -Continue to trend inflammatory markers closely . -So far I see no indication for convalescent plasma or Actemra, but will continue to monitor closely . -Patient was encouraged use incentive spirometry, flutter valve, out of bed to chair and modified proning . -Multivitamins-zinc, vitamin  C  COVID-19 Labs  Recent Labs    10/08/19 0724 10/09/19 0634 10/10/19 0514  DDIMER 0.39 0.37 0.28  FERRITIN 154  --   --   CRP 3.4* 0.6 0.6    Lab Results  Component Value Date   SARSCOV2NAA POSITIVE (A) 10/07/2019   Asthma and COPD, restrictive lung disease -stable without wheezing or rhonchi. -IV steroids, albuterol inhaler - resume home Symbicort, Spiriva  Systolic CHF -stable and compensated.  Echo 04/2017 shows EF of 40 to 45%, G1DD.  Follows with cardiologist, Randy Hebert.  Takes Lasix as needed. - Home lasix not resumed, appears mildly dehydrated, encourage po intake.  History of coronary artery disease- STEMI 2016 with DES, 2018 PCI with stent.  No chest pain.  EKG without changes. -Resume home aspirin and statins -Not on beta-blocker or ACE inhibitor due to history of sinus pauses, bradycardia and angioedema  Hypertension-stable. -Resume home Norvasc  Diabetes mellitus-random glucose 118.  07/2019  7.5. - SSI- M -Resume home glipizide, hold Metformin  OSA-on CPAP at home. -Not resumed due to COVID +ve status   Code Status : Full  Family Communication  : Patient coherent, awake and alert, appropriate, discussed with patient  Disposition Plan  : Home  Barriers For Discharge : Discharge home tomorrow finishing remdesivir  Consults  :  None  Procedures  : None  DVT Prophylaxis  :  [place] lovenox  Lab Results  Component Value Date   PLT 312 10/10/2019    Antibiotics  :    Anti-infectives (From admission, onward)   Start     Dose/Rate Route Frequency Ordered Stop   10/08/19 1000  remdesivir 100 mg in sodium chloride 0.9  % 100 mL IVPB     100 mg 200 mL/hr over 30 Minutes Intravenous Daily 10/07/19 1905 10/12/19 0959   10/07/19 2000  remdesivir 200 mg in sodium chloride 0.9% 250 mL IVPB     200 mg 580 mL/hr over 30 Minutes Intravenous Once 10/07/19 1905 10/08/19 0003        Objective:   Vitals:   10/09/19 2121 10/10/19 0525 10/10/19 0941 10/10/19 1200  BP: 131/78 122/84  119/69  Pulse: 69 (!) 54  67  Resp: 17 17    Temp: (!) 97.5 F (36.4 C) 97.8 F (36.6 C)  97.7 F (36.5 C)  TempSrc: Oral Oral  Oral  SpO2: 95% 94% 97% 98%  Weight:      Height:        Wt Readings from Last 3 Encounters:  10/07/19 94.1 kg  10/04/19 93.9 kg  09/01/19 94.9 kg    No intake or output data in the 24 hours ending 10/10/19 1716   Physical Exam  Awake Alert, Oriented X 3, No new F.N deficits, Normal affect Symmetrical Chest wall movement, Good air movement bilaterally, CTAB RRR,No Gallops,Rubs or new Murmurs, No Parasternal Heave +ve B.Sounds, Abd Soft, No tenderness, No rebound - guarding or rigidity. No Cyanosis, Clubbing or edema, No new Rash or bruise       Data Review:    CBC Recent Labs  Lab 10/07/19 1417 10/08/19 0724 10/09/19 0634 10/10/19 0514  WBC 5.6 4.1 8.3 7.8  HGB 15.2 15.4 14.9 15.2  HCT 45.5 46.8 44.6 46.0  PLT 203 221 270 312  MCV 94.2 93.4 92.3 93.1  MCH 31.5 30.7 30.8 30.8  MCHC 33.4 32.9 33.4 33.0  RDW 12.9 12.5 12.4 12.5  LYMPHSABS 1.0 0.8 1.3 1.6  MONOABS 0.6 0.4 0.7 0.7  EOSABS 0.1 0.0 0.0 0.0  BASOSABS 0.0 0.0 0.0 0.0    Chemistries  Recent Labs  Lab 10/07/19 1417 10/08/19 0724 10/09/19 0634 10/10/19 0514  NA 136 138 137 139  K 4.0 4.0 4.0 4.2  CL 99 99 99 100  CO2 23 27 26 29   GLUCOSE 118* 176* 143* 116*  BUN 9 10 13 12   CREATININE 0.74 0.64 0.59* 0.76  CALCIUM 9.0 9.2 9.3 9.2  AST 77* 58* 39 73*  ALT 48* 44 36 53*  ALKPHOS 121 120 104 103  BILITOT 0.9 0.9 0.9 0.8    ------------------------------------------------------------------------------------------------------------------ No results for input(s): CHOL, HDL, LDLCALC, TRIG, CHOLHDL, LDLDIRECT in the last 72 hours.  Lab Results  Component Value Date   HGBA1C 6.8 (H) 10/07/2019   ------------------------------------------------------------------------------------------------------------------ No results for input(s): TSH, T4TOTAL, T3FREE, THYROIDAB in the last 72 hours.  Invalid input(s): FREET3 ------------------------------------------------------------------------------------------------------------------ Recent Labs    10/08/19 0724  FERRITIN 154    Coagulation profile No results for input(s): INR, PROTIME in the last 168 hours.  Recent Labs    10/09/19 0634 10/10/19 0514  DDIMER 0.37 0.28    Cardiac Enzymes No results for input(s): CKMB, TROPONINI, MYOGLOBIN in the last 168 hours.  Invalid input(s): CK ------------------------------------------------------------------------------------------------------------------    Component Value Date/Time   BNP 32.6 04/15/2017 0927    Inpatient Medications  Scheduled Meds: . amLODipine  10 mg Oral Daily  . vitamin C  500 mg Oral Daily  . aspirin EC  81 mg Oral BID  . atorvastatin  40 mg Oral QPM  . citalopram  20 mg Oral Daily  . dexamethasone (DECADRON) injection  6 mg Intravenous Q24H  . enoxaparin (LOVENOX) injection  40 mg Subcutaneous Q24H  . glipiZIDE  5 mg Oral Q breakfast  . insulin aspart  0-15 Units Subcutaneous TID WC  . insulin aspart  0-5 Units Subcutaneous QHS  . mouth rinse  15 mL Mouth Rinse BID  . mometasone-formoterol  2 puff Inhalation BID  . pantoprazole  40 mg Oral Daily  . umeclidinium bromide  1 puff Inhalation Daily  . zinc sulfate  220 mg Oral Daily   Continuous Infusions: . remdesivir 100 mg in NS 100 mL 100 mg (10/10/19 1000)   PRN Meds:.acetaminophen, albuterol, guaiFENesin-dextromethorphan,  ondansetron **OR** ondansetron (ZOFRAN) IV, polyethylene glycol  Micro Results Recent Results (from the past 240 hour(s))  Respiratory Panel by RT PCR (Flu A&B, Covid) - Nasopharyngeal Swab     Status: Abnormal   Collection Time: 10/07/19  3:21 PM   Specimen: Nasopharyngeal Swab  Result Value Ref Range Status   SARS Coronavirus 2 by RT PCR POSITIVE (A) NEGATIVE Final    Comment: RESULT CALLED TO, READ BACK BY AND VERIFIED WITH: POINTDEXTER,M. AT 1850 ON 10/07/2019 BY EVA (NOTE) SARS-CoV-2 target nucleic acids are DETECTED. SARS-CoV-2 RNA is generally detectable in upper respiratory specimens  during the acute phase of infection. Positive results are indicative of the presence of the identified virus, but do not rule out bacterial infection or co-infection with other pathogens not detected by the test. Clinical correlation with patient history and other diagnostic information is necessary to determine patient infection status. The expected result is Negative. Fact Sheet for Patients:  PinkCheek.be Fact Sheet for Healthcare Providers: GravelBags.it This test is not yet approved or cleared by the [place] FDA and  has been authorized for detection and/or diagnosis of SARS-CoV-2 by FDA under an Emergency Use Authorization (EUA).  This EUA will remain in effect (  meaning this test can be  used) for the duration of  the COVID-19 declaration under Section 564(b)(1) of the Act, 21 U.S.C. section 360bbb-3(b)(1), unless the authorization is terminated or revoked sooner.    Influenza A by PCR NEGATIVE NEGATIVE Final   Influenza B by PCR NEGATIVE NEGATIVE Final    Comment: (NOTE) The Xpert Xpress SARS-CoV-2/FLU/RSV assay is intended as an aid in  the diagnosis of influenza from Nasopharyngeal swab specimens and  should not be used as a sole basis for treatment. Nasal washings and  aspirates are unacceptable for Xpert Xpress  SARS-CoV-2/FLU/RSV  testing. Fact Sheet for Patients: PinkCheek.be Fact Sheet for Healthcare Providers: GravelBags.it This test is not yet approved or cleared by the [place] FDA and  has been authorized for detection and/or diagnosis of SARS-CoV-2 by  FDA under an Emergency Use Authorization (EUA). This EUA will remain  in effect (meaning this test can be used) for the duration of the  Covid-19 declaration under Section 564(b)(1) of the Act, 21  U.S.C. section 360bbb-3(b)(1), unless the authorization is  terminated or revoked. Performed at [facility], [address] [place], [place] [address]     Radiology Reports DG Chest Portable 1 View  Result Date: 10/07/2019 CLINICAL DATA:  Cough, fever EXAM: PORTABLE CHEST 1 VIEW COMPARISON:  03/03/2019 FINDINGS: Chronic mild interstitial prominence with superimposed patchy density, greater on the left. No pleural effusion or pneumothorax. Stable cardiomediastinal contours. Loop recorder is present. IMPRESSION: Mild patchy density, a greater on the left. This may reflect atypical pneumonia in the appropriate setting. Electronically Signed   By: Macy Mis M.D.   On: 10/07/2019 13:49   CUP PACEART REMOTE DEVICE CHECK  Result Date: 09/26/2019 Carelink summary report received. Battery status OK. Normal device function. No new symptom episodes, tachy episodes, brady, or pause episodes. No new AF episodes. Monthly summary reports and ROV/PRN     Phillips Climes M.D on 10/10/2019 at 5:16 PM  Between 7am to 7pm - Pager - [phone]  After 7pm go to [email or web address] - password [facility]  [facility] -  Office  [phone]

## 2019-10-10 NOTE — Progress Notes (Signed)
Pt sitting up in bed resting. Voices no complaints at this time. Call bell is within reach.

## 2019-10-10 NOTE — Care Management Important Message (Signed)
Important Message  Patient Details  Name: Randy Hebert MRN: [id number] Date of Birth: [birth year]   Medicare Important Message Given:  Yes(Jesse, RN will deliver to patient due to airborne and contact precautions)     Tommy Medal 10/10/2019, 4:08 PM

## 2019-10-11 DIAGNOSIS — R0603 Acute respiratory distress: Secondary | ICD-10-CM

## 2019-10-11 DIAGNOSIS — J449 Chronic obstructive pulmonary disease, unspecified: Secondary | ICD-10-CM

## 2019-10-11 DIAGNOSIS — Z6834 Body mass index (BMI) 34.0-34.9, adult: Secondary | ICD-10-CM

## 2019-10-11 DIAGNOSIS — E6609 Other obesity due to excess calories: Secondary | ICD-10-CM

## 2019-10-11 DIAGNOSIS — F418 Other specified anxiety disorders: Secondary | ICD-10-CM

## 2019-10-11 LAB — COMPREHENSIVE METABOLIC PANEL
ALT: 80 U/L — ABNORMAL HIGH (ref 0–44)
AST: 91 U/L — ABNORMAL HIGH (ref 15–41)
Albumin: 3.6 g/dL (ref 3.5–5.0)
Alkaline Phosphatase: 99 U/L (ref 38–126)
Anion gap: 8 (ref 5–15)
BUN: 14 mg/dL (ref 6–20)
CO2: 28 mmol/L (ref 22–32)
Calcium: 9.3 mg/dL (ref 8.9–10.3)
Chloride: 102 mmol/L (ref 98–111)
Creatinine, Ser: 0.66 mg/dL (ref 0.61–1.24)
GFR calc Af Amer: >60 mL/min (ref >60)
GFR calc non Af Amer: >60 mL/min (ref >60)
Glucose, Bld: 137 mg/dL — ABNORMAL HIGH (ref 70–99)
Potassium: 4.3 mmol/L (ref 3.5–5.1)
Sodium: 138 mmol/L (ref 135–145)
Total Bilirubin: 0.8 mg/dL (ref 0.3–1.2)
Total Protein: 7.2 g/dL (ref 6.5–8.1)

## 2019-10-11 LAB — CBC WITH DIFFERENTIAL/PLATELET
Abs Immature Granulocytes: 0.05 10*3/uL (ref 0.00–0.07)
Basophils Absolute: 0 10*3/uL (ref 0.0–0.1)
Basophils Relative: 0 %
Eosinophils Absolute: 0 10*3/uL (ref 0.0–0.5)
Eosinophils Relative: 0 %
HCT: 46.8 % (ref 39.0–52.0)
Hemoglobin: 15.5 g/dL (ref 13.0–17.0)
Immature Granulocytes: 1 %
Lymphocytes Relative: 19 %
Lymphs Abs: 1.4 10*3/uL (ref 0.7–4.0)
MCH: 30.7 pg (ref 26.0–34.0)
MCHC: 33.1 g/dL (ref 30.0–36.0)
MCV: 92.7 fL (ref 80.0–100.0)
Monocytes Absolute: 0.6 10*3/uL (ref 0.1–1.0)
Monocytes Relative: 8 %
Neutro Abs: 5.3 10*3/uL (ref 1.7–7.7)
Neutrophils Relative %: 72 %
Platelets: 326 10*3/uL (ref 150–400)
RBC: 5.05 MIL/uL (ref 4.22–5.81)
RDW: 12.5 % (ref 11.5–15.5)
WBC: 7.5 10*3/uL (ref 4.0–10.5)
nRBC: 0 % (ref 0.0–0.2)

## 2019-10-11 LAB — GLUCOSE, CAPILLARY
Glucose-Capillary: 119 mg/dL — ABNORMAL HIGH (ref 70–99)
Glucose-Capillary: 144 mg/dL — ABNORMAL HIGH (ref 70–99)

## 2019-10-11 LAB — C-REACTIVE PROTEIN: CRP: 0.6 mg/dL (ref <1.0)

## 2019-10-11 LAB — D-DIMER, QUANTITATIVE: D-Dimer, Quant: 0.4 ug/mL-FEU (ref 0.00–0.50)

## 2019-10-11 MED ORDER — FUROSEMIDE 40 MG PO TABS: 40.0000 mg | ORAL_TABLET | Freq: Every day | ORAL | Status: DC | PRN

## 2019-10-11 MED ORDER — DEXAMETHASONE 6 MG PO TABS: 6.0000 mg | ORAL_TABLET | Freq: Every day | ORAL | 0 refills | Status: DC

## 2019-10-11 MED ORDER — ASCORBIC ACID 500 MG PO TABS: 500.0000 mg | ORAL_TABLET | Freq: Every day | ORAL | 1 refills | Status: DC

## 2019-10-11 NOTE — Progress Notes (Signed)
Patient O2 sat 95% on RA while sitting. During ambulation, patient O2 sats maintained 93-94%. MD made aware.

## 2019-10-11 NOTE — Progress Notes (Signed)
Peripheral IV removed.

## 2019-10-11 NOTE — Discharge Summary (Signed)
Physician Discharge Summary  Randy Hebert QIW:[id number] DOB: [birth year] DOA: 10/07/2019  PCP: Rosita Fire, MD  Admit date: 10/07/2019 Discharge date: 10/11/2019  Time spent: 35 minutes  Recommendations for Outpatient Follow-up:  1. Repeat CXR in 4-6 weeks to assure complete resolution of infiltrates. 2. Repeat BMET to follow electrolytes and renal function.   Discharge Diagnoses:  Principal Problem:   Pneumonia due to COVID-19 virus Active Problems:   Essential hypertension, benign   CAD (coronary artery disease)   Chronic systolic CHF (congestive heart failure) (HCC)   Asthma-COPD overlap syndrome (HCC)   Class 1 obesity due to excess calories without serious comorbidity with body mass index (BMI) of 34.0 to 34.9 in adult   Depression with anxiety   Acute respiratory distress   Discharge Condition: stable and improved. Patient discharged home with instructions to follow up with PCP in 10 days.   Diet recommendation: heart healthy, modified carb and low calorie diet.   Filed Weights   10/07/19 1233 10/07/19 2050  Weight: 93.9 kg 94.1 kg    History of present illness:  As per H&P written by Dr. Denton Brick.  54 y.o. male with medical history significant for asthma and COPD, DM, systolic and diastolic CHF, restrictive lung disease, substance abuse.  Patient presented to the ED with complaints of 5 days of cough, difficulty breathing.  He recorded a fever of 100.8 at home.  He also reports loss of taste and smell.  Denies known Covid positive contacts.  Patient checked his O2 sats at home and it was 87%.  He denies chest pain.   ED Course: O2 sats down to 88% in ED improved with 2 L O2.  WBC 5.6.  EKG shows old right bundle branch block.  Portable chest x-ray showing mild patchy density L > R, may reflect atypical pneumonia. POC Covid test negative.  Hospitalist called to admit for acute respiratory failure, possible viral pneumonia.  Hospital Course:  1-acute resp failure with  hypoxia due to COVID-19 infection -excellent response to remdesivir and steroids; patient completed 5 days of remdesivir while inpatient. -will discharge on 5 more days of decadron to complete steroids therapy -continue Vit C and zinc -instructed to continue IS/flutter valve exercises. -inflammatory markers improved. -no oxygen requirement at discharge  2-asthma/COPD -no wheezing at discharge -continue steroids as mentioned above. -resume home bronchodilators, spiriva and symbicort regimen   3-systolic HF, chronic -stable and compensated -resume PRN lasix -continue outpatient follow up with cardiology service.  -low sodium diet and daily weight encouraged.   4-HTN -continue home antihypertensive regimen  -advise to follow low sodium diet.  5-type 2 diabetes -A1C 7.07 Jul 2019 -resume home hypoglycemic regimen  -modified carb diet encouraged.   6-OSA -continue CPAP  7-GERD -continue PPI  8-depression -continue celexa -no SI or hallucinations.   Procedures:  See below for x-ray reports.   Consultations:  None   Discharge Exam: Vitals:   10/11/19 0425 10/11/19 1026  BP: 133/81   Pulse: (!) 56   Resp: 20   Temp: 97.9 F (36.6 C)   SpO2: 95% 96%    General: afebrile, no CP, no nausea, no vomiting, no requiring oxygen supplementation.  Cardiovascular: S1 and S2, not rubs, no gallops. Respiratory: good air movement bilaterally, no requiring oxygen supplementation, normal resp effort.  Abd: soft, NT, ND, positive BS Extremities: no cyanosis, no clubbing.   Discharge Instructions   Discharge Instructions    Diet - low sodium heart healthy   Complete by: As  directed    Discharge instructions   Complete by: As directed    Take medications as prescribed Arrange follow up with PCP in 10 days Follow heart healthy/low sodium diet Maintain adequate hydration Please follow the 3 W's instructions (wash hands, wait 6 feet apart and wear mask)   Increase  activity slowly   Complete by: As directed    MyChart COVID-19 home monitoring program   Complete by: Oct 11, 2019    Is the patient willing to use the [facility] for home monitoring?: Yes   Temperature monitoring   Complete by: Oct 11, 2019    After how many days would you like to receive a notification of this patient's flowsheet entries?: 1     Allergies as of 10/11/2019      Reactions   Carvedilol Other (See Comments)   Sinus pause on telemetry >3 seconds. Longest one 9 sec. No AV nodal agent   Lisinopril Anaphylaxis, Shortness Of Breath, Swelling   Angioedema, required intubation and mechanical ventilation   Amoxicillin Nausea And Vomiting   Nausea only - no allergy      Medication List    STOP taking these medications   naproxen 500 MG tablet Commonly known as: NAPROSYN     TAKE these medications   acetaminophen 500 MG tablet Commonly known as: TYLENOL Take 1,000 mg by mouth every 4 (four) hours as needed for mild pain or fever.   albuterol (2.5 MG/3ML) 0.083% nebulizer solution Commonly known as: PROVENTIL Take 3 mLs (2.5 mg total) by nebulization every 6 (six) hours as needed for wheezing or shortness of breath. What changed: Another medication with the same name was changed. Make sure you understand how and when to take each.   Proventil HFA 108 (90 Base) MCG/ACT inhaler Generic drug: albuterol INHALE 2 PUFFS INTO THE LUNGS EVERY 6 HOURS AS NEEDED FOR SHORTNESS OF BREATH OR WHEEZING. What changed: See the new instructions.   amLODipine 10 MG tablet Commonly known as: NORVASC TAKE 1 TABLET BY MOUTH ONCE A DAY.   ascorbic acid 500 MG tablet Commonly known as: VITAMIN C Take 1 tablet (500 mg total) by mouth daily. Start taking on: October 12, 2019   aspirin 81 MG EC tablet Take 1 tablet (81 mg total) by mouth daily. What changed: when to take this   atorvastatin 40 MG tablet Commonly known as: LIPITOR TAKE 1 TABLET BY MOUTH ONCE A DAY. What  changed: when to take this   azelastine 0.1 % nasal spray Commonly known as: ASTELIN USE 2 SPRAYS IN EACH NOSTRIL TWICE DAILY.   budesonide-formoterol 160-4.5 MCG/ACT inhaler Commonly known as: Symbicort Inhale 2 puffs into the lungs 2 (two) times daily.   CeleXA 20 MG tablet Generic drug: citalopram Take 20 mg by mouth daily.   cycloSPORINE 0.05 % ophthalmic emulsion Commonly known as: RESTASIS Place 1 drop into both eyes 2 (two) times daily.   dexamethasone 6 MG tablet Commonly known as: DECADRON Take 1 tablet (6 mg total) by mouth daily.   ferrous sulfate 325 (65 FE) MG tablet Take 325 mg by mouth daily with breakfast.   furosemide 40 MG tablet Commonly known as: LASIX Take 1 tablet (40 mg total) by mouth daily as needed for fluid.   glipiZIDE 5 MG 24 hr tablet Commonly known as: GLUCOTROL XL Take 1 tablet (5 mg total) by mouth daily with breakfast.   Magnesium 200 MG Tabs Take 1 tablet (200 mg total) by mouth daily.  metFORMIN 1000 MG tablet Commonly known as: GLUCOPHAGE Take 1 tablet (1,000 mg total) by mouth 2 (two) times daily with a meal.   mometasone 0.1 % cream Commonly known as: ELOCON Apply 1 application topically 2 (two) times a week.   nitroGLYCERIN 0.4 MG SL tablet Commonly known as: Nitrostat Place 1 tablet (0.4 mg total) under the tongue every 5 (five) minutes as needed for chest pain.   pantoprazole 40 MG tablet Commonly known as: PROTONIX TAKE 1 TABLET BY MOUTH ONCE A DAY.   Pazeo 0.7 % Soln Generic drug: Olopatadine HCl Apply 1 drop to eye daily.   prednisoLONE acetate 1 % ophthalmic suspension Commonly known as: PRED FORTE Place 4 drops into both eyes 2 (two) times a week.   Refresh Optive Advanced 0.5-1-0.5 % Soln Generic drug: Carboxymeth-Glycerin-Polysorb Apply 1 drop to eye 2 (two) times daily as needed (dry eyes).   Spiriva Respimat 2.5 MCG/ACT Aers Generic drug: Tiotropium Bromide Monohydrate INHALE 1 PUFF INTO THE LUNGS  DAILY. What changed: See the new instructions.   Vitamin D3 125 MCG (5000 UT) Caps Take 1 capsule (5,000 Units total) by mouth daily.      Allergies  Allergen Reactions  . Carvedilol Other (See Comments)    Sinus pause on telemetry >3 seconds. Longest one 9 sec. No AV nodal agent  . Lisinopril Anaphylaxis, Shortness Of Breath and Swelling    Angioedema, required intubation and mechanical ventilation  . Amoxicillin Nausea And Vomiting    Nausea only - no allergy   Follow-up Information    Rosita Fire, MD. Schedule an appointment as soon as possible for a visit in 10 day(s).   Specialty: Internal Medicine Contact information: [place] [address] [phone]        Evans Lance, MD .   Specialty: Cardiology Contact information: [place] [address] [phone]        Arnoldo Lenis, MD .   Specialty: Cardiology Contact information: [address] [place] [address] [phone]            The results of significant diagnostics from this hospitalization (including imaging, microbiology, ancillary and laboratory) are listed below for reference.    Significant Diagnostic Studies: DG Chest Portable 1 View  Result Date: 10/07/2019 CLINICAL DATA:  Cough, fever EXAM: PORTABLE CHEST 1 VIEW COMPARISON:  03/03/2019 FINDINGS: Chronic mild interstitial prominence with superimposed patchy density, greater on the left. No pleural effusion or pneumothorax. Stable cardiomediastinal contours. Loop recorder is present. IMPRESSION: Mild patchy density, a greater on the left. This may reflect atypical pneumonia in the appropriate setting. Electronically Signed   By: Macy Mis M.D.   On: 10/07/2019 13:49   CUP PACEART REMOTE DEVICE CHECK  Result Date: 09/26/2019 [facility] summary report received. Battery status OK. Normal device function. No new symptom episodes, tachy episodes, brady, or pause episodes. No new AF episodes.  Monthly summary reports and ROV/PRN   Microbiology: Recent Results (from the past 240 hour(s))  Respiratory Panel by RT PCR (Flu A&B, Covid) - Nasopharyngeal Swab     Status: Abnormal   Collection Time: 10/07/19  3:21 PM   Specimen: Nasopharyngeal Swab  Result Value Ref Range Status   SARS Coronavirus 2 by RT PCR POSITIVE (A) NEGATIVE Final    Comment: RESULT CALLED TO, READ BACK BY AND VERIFIED WITH: POINTDEXTER,M. AT 1850 ON 10/07/2019 BY EVA (NOTE) SARS-CoV-2 target nucleic acids are DETECTED. SARS-CoV-2 RNA is generally detectable in upper  respiratory specimens  during the acute phase of infection. Positive results are indicative of the presence of the identified virus, but do not rule out bacterial infection or co-infection with other pathogens not detected by the test. Clinical correlation with patient history and other diagnostic information is necessary to determine patient infection status. The expected result is Negative. Fact Sheet for Patients:  PinkCheek.be Fact Sheet for Healthcare Providers: GravelBags.it This test is not yet approved or cleared by the [place] FDA and  has been authorized for detection and/or diagnosis of SARS-CoV-2 by FDA under an Emergency Use Authorization (EUA).  This EUA will remain in effect (meaning this test can be  used) for the duration of  the COVID-19 declaration under Section 564(b)(1) of the Act, 21 U.S.C. section 360bbb-3(b)(1), unless the authorization is terminated or revoked sooner.    Influenza A by PCR NEGATIVE NEGATIVE Final   Influenza B by PCR NEGATIVE NEGATIVE Final    Comment: (NOTE) The Xpert Xpress SARS-CoV-2/FLU/RSV assay is intended as an aid in  the diagnosis of influenza from Nasopharyngeal swab specimens and  should not be used as a sole basis for treatment. Nasal washings and  aspirates are unacceptable for Xpert Xpress SARS-CoV-2/FLU/RSV   testing. Fact Sheet for Patients: PinkCheek.be Fact Sheet for Healthcare Providers: GravelBags.it This test is not yet approved or cleared by the [place] FDA and  has been authorized for detection and/or diagnosis of SARS-CoV-2 by  FDA under an Emergency Use Authorization (EUA). This EUA will remain  in effect (meaning this test can be used) for the duration of the  Covid-19 declaration under Section 564(b)(1) of the Act, 21  U.S.C. section 360bbb-3(b)(1), unless the authorization is  terminated or revoked. Performed at [facility], [address] [place], [place] [address]      Labs: Basic Metabolic Panel: Recent Labs  Lab 10/07/19 1417 10/08/19 0724 10/09/19 0634 10/10/19 0514 10/11/19 0406  NA 136 138 137 139 138  K 4.0 4.0 4.0 4.2 4.3  CL 99 99 99 100 102  CO2 23 27 26 29 28   GLUCOSE 118* 176* 143* 116* 137*  BUN 9 10 13 12 14   CREATININE 0.74 0.64 0.59* 0.76 0.66  CALCIUM 9.0 9.2 9.3 9.2 9.3   Liver Function Tests: Recent Labs  Lab 10/07/19 1417 10/08/19 0724 10/09/19 0634 10/10/19 0514 10/11/19 0406  AST 77* 58* 39 73* 91*  ALT 48* 44 36 53* 80*  ALKPHOS 121 120 104 103 99  BILITOT 0.9 0.9 0.9 0.8 0.8  PROT 7.4 7.8 7.3 7.3 7.2  ALBUMIN 3.7 3.7 3.5 3.6 3.6   No results for input(s): LIPASE, AMYLASE in the last 168 hours. No results for input(s): AMMONIA in the last 168 hours. CBC: Recent Labs  Lab 10/07/19 1417 10/08/19 0724 10/09/19 0634 10/10/19 0514 10/11/19 0406  WBC 5.6 4.1 8.3 7.8 7.5  NEUTROABS 4.0 2.9 6.3 5.4 5.3  HGB 15.2 15.4 14.9 15.2 15.5  HCT 45.5 46.8 44.6 46.0 46.8  MCV 94.2 93.4 92.3 93.1 92.7  PLT 203 221 270 312 326   Cardiac Enzymes: No results for input(s): CKTOTAL, CKMB, CKMBINDEX, TROPONINI in the last 168 hours. BNP: BNP (last 3 results) No results for input(s): BNP in the last 8760 hours.  ProBNP (last 3 results) No results for input(s): PROBNP in the  last 8760 hours.  CBG: Recent Labs  Lab 10/10/19 1130 10/10/19 1612 10/10/19 2141 10/11/19 0727 10/11/19 1102  GLUCAP 272* 244* 131* 119* 144*  Signed:  Barton Dubois MD.  [facility] 10/11/2019, 11:21 AM

## 2019-10-11 NOTE — Plan of Care (Signed)

## 2019-10-11 NOTE — Progress Notes (Signed)
AVS went over with patient. He has no questions or concerns. Discharged from unit in wheelchair, accompanied by nurse aide.

## 2019-10-21 ENCOUNTER — Other Ambulatory Visit: Payer: Self-pay | Admitting: Internal Medicine

## 2019-10-21 DIAGNOSIS — U071 COVID-19: Secondary | ICD-10-CM

## 2019-10-25 DIAGNOSIS — F17210 Nicotine dependence, cigarettes, uncomplicated: Secondary | ICD-10-CM | POA: Diagnosis not present

## 2019-10-25 DIAGNOSIS — G4733 Obstructive sleep apnea (adult) (pediatric): Secondary | ICD-10-CM | POA: Diagnosis not present

## 2019-10-25 DIAGNOSIS — I10 Essential (primary) hypertension: Secondary | ICD-10-CM | POA: Diagnosis not present

## 2019-10-25 DIAGNOSIS — E119 Type 2 diabetes mellitus without complications: Secondary | ICD-10-CM | POA: Diagnosis not present

## 2019-10-25 DIAGNOSIS — Z8616 Personal history of COVID-19: Secondary | ICD-10-CM | POA: Diagnosis not present

## 2019-10-28 ENCOUNTER — Ambulatory Visit (INDEPENDENT_AMBULATORY_CARE_PROVIDER_SITE_OTHER): Payer: Medicare Other | Admitting: Cardiology

## 2019-10-28 ENCOUNTER — Encounter: Payer: Self-pay | Admitting: Cardiology

## 2019-10-28 VITALS — BP 140/77 | HR 90 | Temp 98.1°F | Ht 65.0 in | Wt 206.0 lb

## 2019-10-28 DIAGNOSIS — I2510 Atherosclerotic heart disease of native coronary artery without angina pectoris: Secondary | ICD-10-CM

## 2019-10-28 NOTE — Progress Notes (Signed)
Clinical Summary Randy Hebert is a 54 y.o.male seen today for a focused post hospital f/u, for more detailed history please refer to prior notes   1. COVID pneumonia - admit 10/2019 with COVID pneumonia.  - respodned well to remdesevir and steroids - from notes there was no exacerbation of his chronic cardiac conditions during the admission - we had an extensive evaluation just a few days prior to his admission, from cardiac standpoint was doing well at that time  since discharge denies any chest pain, no SOB or DOE>   2. CAD - admit 01/2015 with lateral STEMI, received DES to D1. There was a 70% ramus and 40% mid RCA that was medically managed - 01/2015 echo LVEF 40%, lateral wall hypokinesis - no beta blocker due to history of sinus pause and bradycardia. No ACE-I given history of severe angioedema, no ARB due to risk of cross reactivity - admit 12/2015 with atypical chest pain, worst with palpatoin and movement. Had done some heavy lifting the day it started, negative workup for ACS. Since that time no recurrent symptoms   04/2017 PCI with stent. -07/2018 nuclear stress prior infarct, no current ischemia  - no recnet chest pain, no SOB or DOE      ZJ:IRCVEL last few yearshis son overdosed,passed away   Past Medical History:  Diagnosis Date  . Allergy   . Angio-edema   . Anxiety   . Arthritis   . Asthma    hip replacement  . Back pain   . Bulging of cervical intervertebral disc   . CAD (coronary artery disease)    lateral STEMI 02/06/2015 00% D1 occlusion treated with Promus Premier 2.5 mm x 16 mm DES, 70% ramus stenosis, 40% mid RCA stenosis, 45% distal RCA stenosis, EF 45-50%  . CHF (congestive heart failure) ([place])   . COPD (chronic obstructive pulmonary disease) ([place])   . Depression   . Diabetes mellitus without complication ([place])   . 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 Dry eye   . Dyspnea   . Early satiety  09/23/2016  . GERD (gastroesophageal reflux disease)   . Headache   . Heart murmur   . Hip pain   . Hyperlipidemia   . Hypertension   . MI (myocardial infarction) ([place])   . Myocardial infarction ([place])   . Neck pain   . Otitis media   . Pleurisy   . Sinus pause    9 sec sinus pause on telemetry after started on coreg after MI, avoid AV nodal blocking agent  . Sleep apnea   . 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 Unilateral primary osteoarthritis, right hip 07/01/2016     Allergies  Allergen Reactions  . Carvedilol Other (See Comments)    Sinus pause on telemetry >3 seconds. Longest one 9 sec. No AV nodal agent  . Lisinopril Anaphylaxis, Shortness Of Breath and Swelling    Angioedema, required intubation and mechanical ventilation  . Amoxicillin Nausea And Vomiting    Nausea only - no allergy     Current Outpatient Medications  Medication Sig Dispense Refill  . acetaminophen (TYLENOL) 500 MG tablet Take 1,000 mg by mouth every 4 (four) hours as needed for mild pain or fever.     Marland Kitchen albuterol (PROVENTIL) (2.5 MG/3ML) 0.083% nebulizer solution Take 3 mLs (2.5 mg total) by nebulization every 6 (six) hours as needed for wheezing or shortness of breath. [place]  mL 1  . amLODipine (NORVASC) 10 MG tablet TAKE 1 TABLET BY MOUTH ONCE A DAY. (Patient taking differently: Take 10 mg by mouth daily. ) 30 tablet 0  . ascorbic acid (VITAMIN C) 500 MG tablet Take 1 tablet (500 mg total) by mouth daily. 30 tablet 1  . aspirin 81 MG EC tablet Take 1 tablet (81 mg total) by mouth daily. (Patient taking differently: Take 81 mg by mouth 2 (two) times daily. ) 30 tablet   . atorvastatin (LIPITOR) 40 MG tablet TAKE 1 TABLET BY MOUTH ONCE A DAY. (Patient taking differently: Take 40 mg by mouth every evening. ) 30 tablet 0  . azelastine (ASTELIN) 0.1 % nasal spray USE 2 SPRAYS IN EACH NOSTRIL TWICE DAILY. (Patient taking differently: Place 2 sprays into both nostrils 2 (two) times daily. ) 30 mL 2  .  budesonide-formoterol (SYMBICORT) 160-4.5 MCG/ACT inhaler Inhale 2 puffs into the lungs 2 (two) times daily. 1 Inhaler 5  . Carboxymeth-Glycerin-Polysorb (REFRESH OPTIVE ADVANCED) 0.5-1-0.5 % SOLN Apply 1 drop to eye 2 (two) times daily as needed (dry eyes).     . Cholecalciferol (VITAMIN D3) 125 MCG (5000 UT) CAPS Take 1 capsule (5,000 Units total) by mouth daily. 90 capsule 0  . citalopram (CELEXA) 20 MG tablet Take 20 mg by mouth daily.    . cycloSPORINE (RESTASIS) 0.05 % ophthalmic emulsion Place 1 drop into both eyes 2 (two) times daily.    Marland Kitchen dexamethasone (DECADRON) 6 MG tablet Take 1 tablet (6 mg total) by mouth daily. 6 tablet 0  . ferrous sulfate 325 (65 FE) MG tablet Take 325 mg by mouth daily with breakfast.    . furosemide (LASIX) 40 MG tablet Take 1 tablet (40 mg total) by mouth daily as needed for fluid.    Marland Kitchen glipiZIDE (GLUCOTROL XL) 5 MG 24 hr tablet Take 1 tablet (5 mg total) by mouth daily with breakfast. 30 tablet 3  . Magnesium 200 MG TABS Take 1 tablet (200 mg total) by mouth daily. 30 each 6  . metFORMIN (GLUCOPHAGE) 1000 MG tablet Take 1 tablet (1,000 mg total) by mouth 2 (two) times daily with a meal. 180 tablet 3  . mometasone (ELOCON) 0.1 % cream Apply 1 application topically 2 (two) times a week.     . nitroGLYCERIN (NITROSTAT) 0.4 MG SL tablet Place 1 tablet (0.4 mg total) under the tongue every 5 (five) minutes as needed for chest pain. 25 tablet 0  . pantoprazole (PROTONIX) 40 MG tablet TAKE 1 TABLET BY MOUTH ONCE A DAY. (Patient taking differently: Take 40 mg by mouth daily. ) 30 tablet 5  . PAZEO 0.7 % SOLN Apply 1 drop to eye daily.     . prednisoLONE acetate (PRED FORTE) 1 % ophthalmic suspension Place 4 drops into both eyes 2 (two) times a week.     Marland Kitchen PROVENTIL HFA 108 (90 Base) MCG/ACT inhaler INHALE 2 PUFFS INTO THE LUNGS EVERY 6 HOURS AS NEEDED FOR SHORTNESS OF BREATH OR WHEEZING. (Patient taking differently: Inhale 2 puffs into the lungs every 6 (six) hours as  needed for wheezing or shortness of breath. ) 18 g 1  . SPIRIVA RESPIMAT 2.5 MCG/ACT AERS INHALE 1 PUFF INTO THE LUNGS DAILY. (Patient taking differently: Inhale 1 puff into the lungs daily. ) 4 g 2   No current facility-administered medications for this visit.     Past Surgical History:  Procedure Laterality Date  . BIOPSY  10/09/2016   Procedure: BIOPSY;  Surgeon: Daneil Dolin, MD;  [facility];  Service: Endoscopy;;  . CARDIAC CATHETERIZATION N/A 02/06/2015   Procedure: Left Heart Cath and Coronary Angiography;  Surgeon: Leonie Man, MD;  Location: [facility];  Service: Cardiovascular;  Laterality: N/A;  . CARDIAC CATHETERIZATION N/A 02/06/2015   Procedure: Coronary Stent Intervention;  Surgeon: Leonie Man, MD;  Location: [facility];  Service: Cardiovascular;  Laterality: N/A;  . COLONOSCOPY WITH PROPOFOL N/A 10/09/2016   Procedure: COLONOSCOPY WITH PROPOFOL;  Surgeon: Daneil Dolin, MD;  [facility];  Service: Endoscopy;  Laterality: N/A;  12:00 pm  . COLONOSCOPY WITH PROPOFOL N/A 11/24/2016   Dr. Gala Romney: Multiple colon polyps removed, tubular adenoma.  Next colonoscopy in 5 years.  . CORONARY ANGIOPLASTY WITH STENT PLACEMENT  01/2015  . CORONARY STENT INTERVENTION N/A 04/27/2017   Procedure: CORONARY STENT INTERVENTION;  Surgeon: Nelva Bush, MD;  Location: [facility];  Service: Cardiovascular;  Laterality: N/A;  . ELECTROPHYSIOLOGY STUDY N/A 06/29/2017   Procedure: ELECTROPHYSIOLOGY STUDY;  Surgeon: Evans Lance, MD;  Location: [facility];  Service: Cardiovascular;  Laterality: N/A;  . ESOPHAGOGASTRODUODENOSCOPY (EGD) WITH PROPOFOL N/A 10/09/2016   Dr. Gala Romney: LA grade a esophagitis.  Barrett's esophagus, biopsy-proven.  Small hiatal hernia.  EGD February 2019  . INCISION / DRAINAGE HAND / FINGER    . LEFT HEART CATH AND CORONARY ANGIOGRAPHY N/A 04/27/2017   Procedure: LEFT HEART CATH AND CORONARY ANGIOGRAPHY;  Surgeon:  Nelva Bush, MD;  Location: [facility];  Service: Cardiovascular;  Laterality: N/A;  . LOOP RECORDER INSERTION  06/29/2017   Procedure: Loop Recorder Insertion;  Surgeon: Evans Lance, MD;  Location: [facility];  Service: Cardiovascular;;  . POLYPECTOMY  11/24/2016   Procedure: POLYPECTOMY;  Surgeon: Daneil Dolin, MD;  [facility];  Service: Endoscopy;;  descending and sigmoid  . TOTAL HIP ARTHROPLASTY Right 07/01/2016  . TOTAL HIP ARTHROPLASTY Right 07/01/2016   Procedure: RIGHT TOTAL HIP ARTHROPLASTY ANTERIOR APPROACH;  Surgeon: Mcarthur Rossetti, MD;  Location: [facility];  Service: Orthopedics;  Laterality: Right;     Allergies  Allergen Reactions  . Carvedilol Other (See Comments)    Sinus pause on telemetry >3 seconds. Longest one 9 sec. No AV nodal agent  . Lisinopril Anaphylaxis, Shortness Of Breath and Swelling    Angioedema, required intubation and mechanical ventilation  . Amoxicillin Nausea And Vomiting    Nausea only - no allergy      Family History  Problem Relation Age of Onset  . Heart attack Father   . Stroke Father   . Arthritis Father   . Heart disease Father   . Cancer Mother        ???  . Arthritis Mother   . Heart disease Brother 2       died in sleep  . Early death Brother   . Diabetes Maternal Uncle   . Alzheimer's disease Maternal Grandmother      Social History Randy Hebert reports that he has been smoking cigarettes. He has a 69.00 pack-year smoking history. He has quit using smokeless tobacco.  His smokeless tobacco use included chew. Randy Hebert reports current alcohol use.   Review of Systems CONSTITUTIONAL: No weight loss, fever, chills, weakness or fatigue.  HEENT: Eyes: No visual loss, blurred vision, double vision or yellow sclerae.No hearing loss, sneezing, congestion, runny nose or sore throat.  SKIN: No rash or  itching.  CARDIOVASCULAR: per hpi RESPIRATORY: No shortness of breath, cough or  sputum.  GASTROINTESTINAL: No anorexia, nausea, vomiting or diarrhea. No abdominal pain or blood.  GENITOURINARY: No burning on urination, no polyuria NEUROLOGICAL: No headache, dizziness, syncope, paralysis, ataxia, numbness or tingling in the extremities. No change in bowel or bladder control.  MUSCULOSKELETAL: No muscle, back pain, joint pain or stiffness.  LYMPHATICS: No enlarged nodes. No history of splenectomy.  PSYCHIATRIC: No history of depression or anxiety.  ENDOCRINOLOGIC: No reports of sweating, cold or heat intolerance. No polyuria or polydipsia.  Marland Kitchen   Physical Examination Today's Vitals   10/28/19 1305  BP: 140/77  Pulse: 90  Temp: 98.1 F (36.7 C)  TempSrc: Temporal  SpO2: 95%  Weight: 206 lb (93.4 kg)  Height: 5\' 5"  (1.651 m)   Body mass index is 34.28 kg/m.  Gen: resting comfortably, no acute distress HEENT: no scleral icterus, pupils equal round and reactive, no palptable cervical adenopathy,  CV: RRR, no m/r/g, no jvd Resp: Clear to auscultation bilaterally GI: abdomen is soft, non-tender, non-distended, normal bowel sounds, no hepatosplenomegaly MSK: extremities are warm, no edema.  Skin: warm, no rash Neuro:  no focal deficits Psych: appropriate affect   Diagnostic Studies  01/2015 cath 1. 1st Diag lesion, 100% stenosed. A Promus Premier 2.5 mm x 16 mm drug-eluting stent was placed. There is a 0% residual stenosis post intervention. 2. Ramus lesion, 70% stenosed. 3. Mid RCA lesion, 40% stenosed. Dist RCA lesion, 45% stenosed. 4. Mild to moderately reduced LVEF with anterolateral and apical hypokinesis and elevated LVEDP  Abnormal anatomy with equal sized Diagonal and LAD Randy Hebert but extensive RCA. Successful PCI of the First Diagonal Randy Hebert.  Recommendations:  Standard post radial cath/PCI TR band removal  Dual independent therapy for minimum one year.  Check 2-D echocardiogram to better assess EF  Add statin and beta blocker. Smoking  cessation counseling. Cardiac Rehabilitation And Case Management Consultation  If hemodynamic stable, would consider fast-track discharge  Would consider noninvasive evaluation of the Ramus Intermedius lesion in the absence of any recurrent anginal pain.  01/2015 echo Study Conclusions  - Left ventricle: Moderately severe hypokinesis of mid anterolateral segment, apical lateral segment, mid/apical anterior segments. EF is 40%. The cavity size was normal. Wall thickness was increased in a pattern of mild LVH. - Aortic valve: Sclerosis without stenosis. There was no significant regurgitation. - Right ventricle: The cavity size was normal. Systolic function was normal.  07/2015 CT PE IMPRESSION: 1. Suboptimal contrast bolus timing in the pulmonary arterial tree, poor in the lobar vessels other than the right lower lobe. No central pulmonary embolus. If there is continued suspicion of acute PE nuclear medicine V/Q scan may be most helpful. 2. Situs abnormality in the chest with right side aortic arch and descending aorta. Mirror image branching. Other visible situs including cardiac and upper abdominal situs appear normal. 3. Calcified Coronary artery atherosclerosis. Hepatomegaly with fatty liver disease.  07/2015 PFTs: mild to mod ventilatory defect with small airway obstruction, moderately reduced DLCO.   07/2018 nuclear stress  There was no ST segment deviation noted during stress.  Defect 1: There is a medium defect of moderate severity present in the mid anterior, mid anterolateral and apical anterior location.  Findings consistent with prior myocardial infarction.  This is an intermediate risk study.  Nuclear stress EF: 53%.    Assessment and Plan   1. CAD -no recent symptoms, continue current meds - recent admission with COVID pneumonia, fortunatly  no cardiac complications. His chronic cardiac issues as reviewed in detail at our appt earlier this  month remain stable. - continue current therapy.      Arnoldo Lenis, M.D.

## 2019-10-28 NOTE — Patient Instructions (Signed)

## 2019-10-31 ENCOUNTER — Ambulatory Visit (INDEPENDENT_AMBULATORY_CARE_PROVIDER_SITE_OTHER): Payer: Medicare Other | Admitting: *Deleted

## 2019-10-31 DIAGNOSIS — R55 Syncope and collapse: Secondary | ICD-10-CM | POA: Diagnosis not present

## 2019-10-31 LAB — CUP PACEART REMOTE DEVICE CHECK
Date Time Interrogation Session: 20210228231416
Implantable Pulse Generator Implant Date: 20181029

## 2019-11-01 NOTE — Progress Notes (Signed)
ILR Remote 

## 2019-11-03 DIAGNOSIS — G4733 Obstructive sleep apnea (adult) (pediatric): Secondary | ICD-10-CM | POA: Diagnosis not present

## 2019-11-09 DIAGNOSIS — E559 Vitamin D deficiency, unspecified: Secondary | ICD-10-CM | POA: Diagnosis not present

## 2019-11-09 DIAGNOSIS — E1159 Type 2 diabetes mellitus with other circulatory complications: Secondary | ICD-10-CM | POA: Diagnosis not present

## 2019-11-10 ENCOUNTER — Other Ambulatory Visit: Payer: Self-pay

## 2019-11-10 ENCOUNTER — Ambulatory Visit (HOSPITAL_COMMUNITY)
Admission: RE | Admit: 2019-11-10 | Discharge: 2019-11-10 | Disposition: A | Discharge status: Home / Self Care | Payer: Medicare Other | Source: Ambulatory Visit | Attending: Internal Medicine | Admitting: Internal Medicine

## 2019-11-10 ENCOUNTER — Other Ambulatory Visit (HOSPITAL_COMMUNITY): Payer: Self-pay | Admitting: Internal Medicine

## 2019-11-10 DIAGNOSIS — J1282 Pneumonia due to coronavirus disease 2019: Secondary | ICD-10-CM

## 2019-11-10 DIAGNOSIS — R0602 Shortness of breath: Secondary | ICD-10-CM | POA: Diagnosis not present

## 2019-11-10 DIAGNOSIS — U071 COVID-19: Secondary | ICD-10-CM | POA: Insufficient documentation

## 2019-11-10 LAB — VITAMIN D 25 HYDROXY (VIT D DEFICIENCY, FRACTURES): Vit D, 25-Hydroxy: 31 ng/mL (ref 30–100)

## 2019-11-10 LAB — LIPID PANEL
Cholesterol: 154 mg/dL (ref <200)
HDL: 30 mg/dL — ABNORMAL LOW (ref >=40)
LDL Cholesterol (Calc): 86 mg/dL (calc)
Non-HDL Cholesterol (Calc): 124 mg/dL (calc) (ref <130)
Total CHOL/HDL Ratio: 5.1 (calc) — ABNORMAL HIGH (ref <5.0)
Triglycerides: 319 mg/dL — ABNORMAL HIGH (ref <150)

## 2019-11-10 LAB — MICROALBUMIN / CREATININE URINE RATIO
Creatinine, Urine: 183 mg/dL (ref 20–320)
Microalb Creat Ratio: 10 mcg/mg creat (ref <30)
Microalb, Ur: 1.8 mg/dL

## 2019-11-11 ENCOUNTER — Encounter: Payer: Self-pay | Admitting: Allergy & Immunology

## 2019-11-11 ENCOUNTER — Ambulatory Visit (INDEPENDENT_AMBULATORY_CARE_PROVIDER_SITE_OTHER): Payer: Medicare Other | Admitting: Allergy & Immunology

## 2019-11-11 VITALS — BP 132/80 | HR 103 | Temp 98.3°F | Resp 22 | Ht 65.0 in | Wt 204.3 lb

## 2019-11-11 DIAGNOSIS — T781XXD Other adverse food reactions, not elsewhere classified, subsequent encounter: Secondary | ICD-10-CM | POA: Diagnosis not present

## 2019-11-11 DIAGNOSIS — J449 Chronic obstructive pulmonary disease, unspecified: Secondary | ICD-10-CM | POA: Diagnosis not present

## 2019-11-11 DIAGNOSIS — J3089 Other allergic rhinitis: Secondary | ICD-10-CM

## 2019-11-11 DIAGNOSIS — J302 Other seasonal allergic rhinitis: Secondary | ICD-10-CM | POA: Diagnosis not present

## 2019-11-11 DIAGNOSIS — J984 Other disorders of lung: Secondary | ICD-10-CM | POA: Diagnosis not present

## 2019-11-11 NOTE — Patient Instructions (Addendum)
1. Asthma-COPD overlap syndrome - Lung testing looks fairly stable today.  - We are not going to make any changes since you are doing fairly well. - I am glad that you made such a good recovery from your COVID19 pneumonia.  - Daily controller medication(s): Spiriva 2.3mcg one inhalation once daily and Symbicort 160/4.16mcg two puffs twice daily with spacer - Prior to physical activity: Ventolin 2 puffs 10-15 minutes before physical activity. - Rescue medications: Ventolin 4 puffs every 4-6 hours as needed - Asthma control goals:  * Full participation in all desired activities (may need albuterol before activity) * Albuterol use two time or less a week on average (not counting use with activity) * Cough interfering with sleep two time or less a month * Oral steroids no more than once a year * No hospitalizations  2. Chronic rhinitis (grasses, ragweed, weeds, indoor molds, outdoor molds, cat, dog and cockroach) - Continue with the shots at the current schedule (you have not quite reached maintenance or Red Vial).  - Continue with: Zyrtec (cetirizine) 10mg  tablet once daily and Astelin (azelastine) 2 sprays per nostril 1-2 times daily as needed - I think once you reach higher on our allergy shots into a more concentrated vial, symptoms will improve.   3. No follow-ups on file. This can be an in-person, a virtual Webex or a telephone follow up visit.   Please inform us of any Emergency Department visits, hospitalizations, or changes in symptoms. Call us before going to the ED for breathing or allergy symptoms since we might be able to fit you in for a sick visit. Feel free to contact us anytime with any questions, problems, or concerns.  It was a pleasure to see you again today!  Websites that have reliable patient information: 1. American Academy of Asthma, Allergy, and Immunology: www.aaaai.org 2. Food Allergy Research and Education (FARE): foodallergy.org 3. Mothers of Asthmatics:  http://www.asthmacommunitynetwork.org 4. American College of Allergy, Asthma, and Immunology: www.acaai.org   COVID-19 Vaccine Information can be found at: ShippingScam.co.uk For questions related to vaccine distribution or appointments, please email [email or web address] or call [phone].     "Like" [place] on Facebook and Instagram for our latest updates!        Make sure you are registered to vote! If you have moved or changed any of your contact information, you will need to get this updated before voting!  In some cases, you MAY be able to register to vote online: [email or web address]

## 2019-11-11 NOTE — Progress Notes (Signed)
FOLLOW UP  Date of Service/Encounter:  11/11/19   Assessment:   Asthma-COPD overlap syndrome- with eosinophilic phenotype  Perennial and seasonal allergicrhinitis(grasses, ragweed, weeds, indoor molds, outdoor molds, cat, dog and cockroach) - on allergen immunotherapy (maintenance reached October 2020)  Adverse food reaction- with negative testing and not consistent with an IgE mediated process  OSA - on CPAP (seems compliant)  Recent COVID19 infection - s/p dexamethasone and remdesivir  Plan/Recommendations:   1. Asthma-COPD overlap syndrome - Lung testing looks fairly stable today.  - We are not going to make any changes since you are doing fairly well. - I am glad that you made such a good recovery from your COVID19 pneumonia.  - Daily controller medication(s): Spiriva 2.12mcg one inhalation once daily and Symbicort 160/4.54mcg two puffs twice daily with spacer - Prior to physical activity: Ventolin 2 puffs 10-15 minutes before physical activity. - Rescue medications: Ventolin 4 puffs every 4-6 hours as needed - Asthma control goals:  * Full participation in all desired activities (may need albuterol before activity) * Albuterol use two time or less a week on average (not counting use with activity) * Cough interfering with sleep two time or less a month * Oral steroids no more than once a year * No hospitalizations  2. Chronic rhinitis (grasses, ragweed, weeds, indoor molds, outdoor molds, cat, dog and cockroach) - Continue with the shots at the current schedule (you have not quite reached maintenance or Red Vial).  - Continue with: Zyrtec (cetirizine) 10mg  tablet once daily and Astelin (azelastine) 2 sprays per nostril 1-2 times daily as needed - I think once you reach higher on our allergy shots into a more concentrated vial, symptoms will improve.   3. Follow up in 6 months or earlier if needed. This can be an in-person, a virtual Webex or a telephone follow up  visit.   Subjective:   Randy Hebert is a 54 y.o. male presenting today for follow up of  Chief Complaint  Patient presents with  . Asthma  . Allergic Rhinitis     Randy Hebert has a history of the following: Patient Active Problem List   Diagnosis Date Noted  . Acute respiratory distress   . Pneumonia due to COVID-19 virus 10/07/2019  . Seasonal and perennial allergic rhinitis 11/10/2018  . Restrictive lung disease 11/10/2018  . Fatty liver 11/08/2018  . Rectal bleeding 11/08/2018  . LLQ pain 08/04/2018  . Diarrhea 08/04/2018  . Melena 08/04/2018  . Barrett's esophagus 08/04/2018  . Transaminitis 08/04/2018  . Weight gain 06/30/2018  . Vitamin D deficiency 06/30/2018  . Alcohol abuse 06/30/2018  . DM type 2 causing vascular disease ([place]) 08/17/2017  . Syncope 06/29/2017  . HCAP (healthcare-associated pneumonia) 05/15/2017  . Uncontrolled type 2 diabetes mellitus with hyperglycemia, without long-term current use of insulin ([place]) 05/15/2017  . Acute on chronic respiratory failure with hypoxia ([place]) 05/15/2017  . Lobar pneumonia ([place]) 05/15/2017  . Chronic combined systolic and diastolic CHF (congestive heart failure) ([place]) 05/15/2017  . Sinus pause 05/15/2017  . Syncope and collapse 05/15/2017  . Bradycardia 04/28/2017  . Sleep apnea 04/28/2017  . Non-ST elevation (NSTEMI) myocardial infarction ([place]) 04/27/2017  . NSTEMI (non-ST elevated myocardial infarction) ([place]) 04/26/2017  . Substance abuse ([place]) 04/15/2017  . Depression with anxiety 04/15/2017  . GERD (gastroesophageal reflux disease) 09/23/2016  . 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 Asthma-COPD overlap syndrome ([place]) 08/27/2015  . Cigarette nicotine dependence with nicotine-induced  disorder 08/27/2015  . Mixed hyperlipidemia 08/27/2015  . Class 1 obesity due to excess calories without serious comorbidity with body mass index (BMI) of 34.0 to 34.9 in adult 08/27/2015  . Acute on  chronic systolic heart failure ([place]) 06/02/2015  . ACE inhibitor-aggravated angioedema   . Chronic systolic CHF (congestive heart failure) ([place]) 06/01/2015  . CAD (coronary artery disease) 05/31/2015  . STEMI (ST elevation myocardial infarction) ([place]) 05/31/2015  . Current smoker 05/31/2015  . Essential hypertension, benign 02/09/2015    History obtained from: chart review and patient.  [name] is a 54 y.o. male presenting for a follow up visit. He was last seen in September 2020. At that time, lung testing looked stable. He has a history of restrictive lung disease. We continued with Spiriva one inhalation once daily as well as Symbicort 160/4.5 two puffs twice daily. For his rhinitis, we continued with allergen immunotherapy as well as Zyrtec and Astelin.  Since last visit, he is not done very well.  He actually stopped getting his shots because he did not feel well. He ended up going to the hospital and his sats kept dropping. He was diagnosed with "double pneumonia". He was in the hospital for four days. He thinks that he got steroids at discharge. He did have testing for COVID19 on three occasions, but this was all negative. He had all of the symptoms of this, however. He got remsdesivir and decadron. He was treated as presumed COVID19.  He did get a repeat chest x-ray which was reportedly clear.  Asthma/Respiratory Symptom History: He remains on the Spiriva and the Symbicort. He feels that this is doing a good job of controlling his symptoms. He did not get steroids in the hospital at all.  ACT score is 16, indicating subpar asthma control.  This is likely indicative of his recent hospitalization.  Aside from the ER visit and hospital stay in February, he has mostly done well.  Allergic Rhinitis Symptom History: He remains on the Astelin as well as cetirizine.  He has not had an allergy shot in quite some time because he has been ill.  He would like to restart his allergy shots today.    [name] is on allergen immunotherapy. He receives two injections. Immunotherapy script #1 contains weeds, grasses, cat and dog. He currently receives 0.029mL of the RED vial (1/100). Immunotherapy script #2 contains molds and cockroach. He currently receives 0.037mL of the RED vial (1/100). He started shots March of 2020 and reached maintenance in October 2020.  Otherwise, there have been no changes to his past medical history, surgical history, family history, or social history.    Review of Systems  Constitutional: Negative.  Negative for chills, fever, malaise/fatigue and weight loss.  HENT: Negative.  Negative for congestion, ear discharge, ear pain and sore throat.   Eyes: Negative for pain, discharge and redness.  Respiratory: Positive for cough and shortness of breath. Negative for sputum production and wheezing.   Cardiovascular: Negative.  Negative for chest pain and palpitations.  Gastrointestinal: Negative for abdominal pain, constipation, diarrhea, heartburn, nausea and vomiting.  Skin: Negative.  Negative for itching and rash.  Neurological: Negative for dizziness and headaches.  Endo/Heme/Allergies: Negative for environmental allergies. Does not bruise/bleed easily.       Objective:   Blood pressure 132/80, pulse (!) 103, temperature 98.3 F (36.8 C), temperature source Temporal, resp. rate (!) 22, height 5\' 5"  (1.651 m), weight 204 lb 4.8 oz (92.7 kg), SpO2 95 %. Body mass  index is 34 kg/m.   Physical Exam:  Physical Exam  Constitutional: He appears well-developed.  Pleasant male. Talkative.   HENT:  Head: Normocephalic and atraumatic.  Right Ear: Tympanic membrane, external ear and ear canal normal.  Left Ear: Tympanic membrane, external ear and ear canal normal.  Nose: Mucosal edema and rhinorrhea present. No nasal deformity or septal deviation. No epistaxis. Right sinus exhibits no maxillary sinus tenderness and no frontal sinus tenderness. Left sinus  exhibits no maxillary sinus tenderness and no frontal sinus tenderness.  Mouth/Throat: Uvula is midline and oropharynx is clear and moist. Mucous membranes are not pale and not dry.  Tonsils present bilaterally.  Eyes: Pupils are equal, round, and reactive to light. Conjunctivae and EOM are normal. Right eye exhibits no chemosis and no discharge. Left eye exhibits no chemosis and no discharge. Right conjunctiva is not injected. Left conjunctiva is not injected.  Cardiovascular: Normal rate, regular rhythm and normal heart sounds.  Respiratory: Effort normal and breath sounds normal. No accessory muscle usage. No tachypnea. No respiratory distress. He has no wheezes. He has no rhonchi. He has no rales. He exhibits no tenderness.  Moving air well in all lung fields.  No increased work of breathing.  Lymphadenopathy:    He has no cervical adenopathy.  Neurological: He is alert.  Skin: No abrasion, no petechiae and no rash noted. Rash is not papular, not vesicular and not urticarial. No erythema. No pallor.  No eczematous or urticarial lesions noted.  He does have several areas of skin picking.  Psychiatric: He has a normal mood and affect.     Diagnostic studies:    Spirometry: results abnormal (FEV1: 1.64/52%, FVC: 2.16/53%, FEV1/FVC: 76%).    Spirometry consistent with severe restrictive disease.   Allergy Studies: none       Salvatore Marvel, MD  [facility]

## 2019-11-15 ENCOUNTER — Other Ambulatory Visit: Payer: Self-pay

## 2019-11-15 DIAGNOSIS — E1159 Type 2 diabetes mellitus with other circulatory complications: Secondary | ICD-10-CM

## 2019-11-16 ENCOUNTER — Encounter: Payer: Self-pay | Admitting: Nutrition

## 2019-11-16 ENCOUNTER — Other Ambulatory Visit: Payer: Self-pay

## 2019-11-16 ENCOUNTER — Encounter: Payer: Medicare Other | Attending: "Endocrinology | Admitting: Nutrition

## 2019-11-16 ENCOUNTER — Ambulatory Visit (INDEPENDENT_AMBULATORY_CARE_PROVIDER_SITE_OTHER): Payer: Medicare Other | Admitting: "Endocrinology

## 2019-11-16 ENCOUNTER — Encounter: Payer: Self-pay | Admitting: "Endocrinology

## 2019-11-16 VITALS — Ht 65.0 in | Wt 204.0 lb

## 2019-11-16 VITALS — BP 129/86 | HR 93 | Ht 65.0 in | Wt 204.2 lb

## 2019-11-16 DIAGNOSIS — E1165 Type 2 diabetes mellitus with hyperglycemia: Secondary | ICD-10-CM | POA: Diagnosis not present

## 2019-11-16 DIAGNOSIS — IMO0002 Reserved for concepts with insufficient information to code with codable children: Secondary | ICD-10-CM

## 2019-11-16 DIAGNOSIS — E118 Type 2 diabetes mellitus with unspecified complications: Secondary | ICD-10-CM | POA: Insufficient documentation

## 2019-11-16 DIAGNOSIS — F17200 Nicotine dependence, unspecified, uncomplicated: Secondary | ICD-10-CM | POA: Diagnosis not present

## 2019-11-16 DIAGNOSIS — E782 Mixed hyperlipidemia: Secondary | ICD-10-CM | POA: Diagnosis not present

## 2019-11-16 DIAGNOSIS — E669 Obesity, unspecified: Secondary | ICD-10-CM | POA: Insufficient documentation

## 2019-11-16 DIAGNOSIS — I10 Essential (primary) hypertension: Secondary | ICD-10-CM

## 2019-11-16 DIAGNOSIS — E1159 Type 2 diabetes mellitus with other circulatory complications: Secondary | ICD-10-CM

## 2019-11-16 DIAGNOSIS — K760 Fatty (change of) liver, not elsewhere classified: Secondary | ICD-10-CM | POA: Diagnosis not present

## 2019-11-16 NOTE — Patient Instructions (Addendum)
Goals  Quit smoking by April 1st. Increase fresh fruits and vegetables Walk 30 minutes a day Get A1C 6.5% or less Lose 5 lbs per month

## 2019-11-16 NOTE — Progress Notes (Signed)
11/16/2019, 6:30 PM                                Endocrinology Telehealth Visit Follow up Note -During COVID -19 Pandemic  I connected with Randy Hebert on 11/16/2019   by telephone and verified that I am speaking with the correct person using two identifiers. Randy Hebert, 1966/07/15. he has verbally consented to this visit. All issues noted in this document were discussed and addressed. The format was not optimal for physical exam   Subjective:    Patient ID: Randy Hebert, male    DOB: 12-05-1965.  Randy Hebert is being engaged in telehealth via form follow-up for management of currently uncontrolled symptomatic type 2 diabetes, hyperlipidemia, hypertension. PMD:  Rosita Fire, MD.   Past Medical History:  Diagnosis Date  . Allergy   . Angio-edema   . Anxiety   . Arthritis   . Asthma    hip replacement  . Back pain   . Bulging of cervical intervertebral disc   . CAD (coronary artery disease)    lateral STEMI 02/06/2015 00% D1 occlusion treated with Promus Premier 2.5 mm x 16 mm DES, 70% ramus stenosis, 40% mid RCA stenosis, 45% distal RCA stenosis, EF 45-50%  . CHF (congestive heart failure) ([place])   . COPD (chronic obstructive pulmonary disease) ([place])   . Depression   . Diabetes mellitus without complication ([place])   . 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 Dry eye   . Dyspnea   . Early satiety 09/23/2016  . GERD (gastroesophageal reflux disease)   . Headache   . Heart murmur   . Hip pain   . Hyperlipidemia   . Hypertension   . MI (myocardial infarction) ([place])   . Myocardial infarction ([place])   . Neck pain   . Otitis media   . Pleurisy   . Sinus pause    9 sec sinus pause on telemetry after started on coreg after MI, avoid AV nodal blocking agent  . Sleep apnea   . 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 Unilateral primary osteoarthritis, right hip 07/01/2016   Past Surgical History:   Procedure Laterality Date  . BIOPSY  10/09/2016   Procedure: BIOPSY;  Surgeon: Daneil Dolin, MD;  [facility];  Service: Endoscopy;;  . CARDIAC CATHETERIZATION N/A 02/06/2015   Procedure: Left Heart Cath and Coronary Angiography;  Surgeon: Leonie Man, MD;  Location: [place] [facility];  Service: Cardiovascular;  Laterality: N/A;  . CARDIAC CATHETERIZATION N/A 02/06/2015   Procedure: Coronary Stent Intervention;  Surgeon: Leonie Man, MD;  Location: [place] [facility];  Service: Cardiovascular;  Laterality: N/A;  . COLONOSCOPY WITH PROPOFOL N/A 10/09/2016   Procedure: COLONOSCOPY WITH PROPOFOL;  Surgeon: Daneil Dolin, MD;  [facility];  Service: Endoscopy;  Laterality: N/A;  12:00 pm  . COLONOSCOPY WITH PROPOFOL N/A 11/24/2016   Dr. Gala Romney: Multiple colon polyps removed, tubular adenoma.  Next colonoscopy in 5 years.  . CORONARY ANGIOPLASTY WITH STENT PLACEMENT  01/2015  . CORONARY STENT INTERVENTION N/A 04/27/2017   Procedure: CORONARY STENT INTERVENTION;  Surgeon: Nelva Bush, MD;  Location: [place] [facility];  Service: Cardiovascular;  Laterality: N/A;  . ELECTROPHYSIOLOGY STUDY N/A 06/29/2017   Procedure: ELECTROPHYSIOLOGY STUDY;  Surgeon: Cristopher Peru  W, MD;  Location: [place] [facility];  Service: Cardiovascular;  Laterality: N/A;  . ESOPHAGOGASTRODUODENOSCOPY (EGD) WITH PROPOFOL N/A 10/09/2016   Dr. Gala Romney: LA grade a esophagitis.  Barrett's esophagus, biopsy-proven.  Small hiatal hernia.  EGD February 2019  . INCISION / DRAINAGE HAND / FINGER    . LEFT HEART CATH AND CORONARY ANGIOGRAPHY N/A 04/27/2017   Procedure: LEFT HEART CATH AND CORONARY ANGIOGRAPHY;  Surgeon: Nelva Bush, MD;  Location: [place] [facility];  Service: Cardiovascular;  Laterality: N/A;  . LOOP RECORDER INSERTION  06/29/2017   Procedure: Loop Recorder Insertion;  Surgeon: Evans Lance, MD;  Location: [place] [facility];  Service: Cardiovascular;;  . POLYPECTOMY  11/24/2016    Procedure: POLYPECTOMY;  Surgeon: Daneil Dolin, MD;  [facility];  Service: Endoscopy;;  descending and sigmoid  . TOTAL HIP ARTHROPLASTY Right 07/01/2016  . TOTAL HIP ARTHROPLASTY Right 07/01/2016   Procedure: RIGHT TOTAL HIP ARTHROPLASTY ANTERIOR APPROACH;  Surgeon: Mcarthur Rossetti, MD;  Location: [place];  Service: Orthopedics;  Laterality: Right;   Social History   Socioeconomic History  . Marital status: Significant Other    Spouse name: alice  . Number of children: 3  . Years of education: 68  . Highest education level: Not on file  Occupational History  . Occupation: unemployed    Comment: Nature conservation officer    Comment: disability pending  Tobacco Use  . Smoking status: Current Every Day Smoker    Packs/day: 1.50    Years: 46.00    Pack years: 69.00    Types: Cigarettes  . Smokeless tobacco: Former Systems developer    Types: Chew  Substance and Sexual Activity  . Alcohol use: Yes    Alcohol/week: 0.0 standard drinks    Comment: pint of whiskey daily  . Drug use: No    Comment: history of drug use- marijuana, cocaine- quit 2014  . Sexual activity: Yes    Partners: Female    Birth control/protection: None  Other Topics Concern  . Not on file  Social History Narrative   Lives with girlfriend [name]   Leisure - TV   Lost his son 07-03-2018 due to overdose   Social Determinants of Health   Financial Resource Strain:   . Difficulty of Paying Living Expenses:   Food Insecurity:   . Worried About Charity fundraiser in the Last Year:   . Arboriculturist in the Last Year:   Transportation Needs:   . Film/video editor (Medical):   Marland Kitchen Lack of Transportation (Non-Medical):   Physical Activity:   . Days of Exercise per Week:   . Minutes of Exercise per Session:   Stress:   . Feeling of Stress :   Social Connections:   . Frequency of Communication with Friends and Family:   . Frequency of Social Gatherings with Friends and Family:   . Attends Religious  Services:   . Active Member of Clubs or Organizations:   . Attends Archivist Meetings:   Marland Kitchen Marital Status:    Outpatient Encounter Medications as of 11/16/2019  Medication Sig  . acetaminophen (TYLENOL) 500 MG tablet Take 1,000 mg by mouth every 4 (four) hours as needed for mild pain or fever.   Marland Kitchen albuterol (PROVENTIL) (2.5 MG/3ML) 0.083% nebulizer solution Take 3 mLs (2.5 mg total) by nebulization every 6 (six) hours as needed for wheezing or shortness of breath.  Marland Kitchen amLODipine (NORVASC) 10 MG tablet TAKE 1 TABLET  BY MOUTH ONCE A DAY. (Patient taking differently: Take 10 mg by mouth daily. )  . ascorbic acid (VITAMIN C) 500 MG tablet Take 1 tablet (500 mg total) by mouth daily.  Marland Kitchen aspirin 81 MG EC tablet Take 1 tablet (81 mg total) by mouth daily. (Patient taking differently: Take 81 mg by mouth 2 (two) times daily. )  . atorvastatin (LIPITOR) 40 MG tablet TAKE 1 TABLET BY MOUTH ONCE A DAY. (Patient taking differently: Take 40 mg by mouth every evening. )  . azelastine (ASTELIN) 0.1 % nasal spray USE 2 SPRAYS IN EACH NOSTRIL TWICE DAILY. (Patient taking differently: Place 2 sprays into both nostrils 2 (two) times daily. )  . budesonide-formoterol (SYMBICORT) 160-4.5 MCG/ACT inhaler Inhale 2 puffs into the lungs 2 (two) times daily.  . Carboxymeth-Glycerin-Polysorb (REFRESH OPTIVE ADVANCED) 0.5-1-0.5 % SOLN Apply 1 drop to eye 2 (two) times daily as needed (dry eyes).   . Cholecalciferol (VITAMIN D3) 125 MCG (5000 UT) CAPS Take 1 capsule (5,000 Units total) by mouth daily.  . citalopram (CELEXA) 20 MG tablet Take 20 mg by mouth daily.  . cycloSPORINE (RESTASIS) 0.05 % ophthalmic emulsion Place 1 drop into both eyes 2 (two) times daily.  Marland Kitchen dexamethasone (DECADRON) 6 MG tablet Take 1 tablet (6 mg total) by mouth daily.  . ferrous sulfate 325 (65 FE) MG tablet Take 325 mg by mouth daily with breakfast.  . furosemide (LASIX) 40 MG tablet Take 1 tablet (40 mg total) by mouth daily as needed  for fluid.  Marland Kitchen glipiZIDE (GLUCOTROL XL) 5 MG 24 hr tablet Take 1 tablet (5 mg total) by mouth daily with breakfast.  . Magnesium 200 MG TABS Take 1 tablet (200 mg total) by mouth daily.  . metFORMIN (GLUCOPHAGE) 1000 MG tablet Take 1 tablet (1,000 mg total) by mouth 2 (two) times daily with a meal.  . mometasone (ELOCON) 0.1 % cream Apply 1 application topically 2 (two) times a week.   . nitroGLYCERIN (NITROSTAT) 0.4 MG SL tablet Place 1 tablet (0.4 mg total) under the tongue every 5 (five) minutes as needed for chest pain.  . pantoprazole (PROTONIX) 40 MG tablet TAKE 1 TABLET BY MOUTH ONCE A DAY. (Patient taking differently: Take 40 mg by mouth daily. )  . PAZEO 0.7 % SOLN Apply 1 drop to eye daily.   . prednisoLONE acetate (PRED FORTE) 1 % ophthalmic suspension Place 4 drops into both eyes 2 (two) times a week.   Marland Kitchen PROVENTIL HFA 108 (90 Base) MCG/ACT inhaler INHALE 2 PUFFS INTO THE LUNGS EVERY 6 HOURS AS NEEDED FOR SHORTNESS OF BREATH OR WHEEZING. (Patient taking differently: Inhale 2 puffs into the lungs every 6 (six) hours as needed for wheezing or shortness of breath. )  . SPIRIVA RESPIMAT 2.5 MCG/ACT AERS INHALE 1 PUFF INTO THE LUNGS DAILY. (Patient taking differently: Inhale 1 puff into the lungs daily. )   No facility-administered encounter medications on file as of 11/16/2019.    ALLERGIES: Allergies  Allergen Reactions  . Carvedilol Other (See Comments)    Sinus pause on telemetry >3 seconds. Longest one 9 sec. No AV nodal agent  . Lisinopril Anaphylaxis, Shortness Of Breath and Swelling    Angioedema, required intubation and mechanical ventilation  . Amoxicillin Nausea And Vomiting and Nausea Only    Nausea only - no allergy    VACCINATION STATUS: Immunization History  Administered Date(s) Administered  . Influenza,inj,Quad PF,6+ Mos 07/03/2016, 05/07/2017  . Pneumococcal Conjugate-13 06/17/2017  . Pneumococcal Polysaccharide-23  02/07/2015  . Tdap 05/17/2019     Diabetes He presents for his follow-up diabetic visit. He has type 2 diabetes mellitus. Onset time: He was diagnosed at approximate age of 55 years. His disease course has been improving. There are no hypoglycemic associated symptoms. Pertinent negatives for hypoglycemia include no confusion, headaches, pallor or seizures. Pertinent negatives for diabetes include no chest pain, no fatigue, no polydipsia, no polyphagia, no polyuria and no weakness. There are no hypoglycemic complications. Symptoms are improving. Diabetic complications include heart disease. Risk factors for coronary artery disease include dyslipidemia, diabetes mellitus, hypertension, male sex, family history, obesity, sedentary lifestyle and tobacco exposure. Current diabetic treatment includes oral agent (monotherapy). He is compliant with treatment most of the time. His weight is stable. He is following a generally unhealthy diet. When asked about meal planning, he reported none. He has had a previous visit with a dietitian. He never participates in exercise. His home blood glucose trend is decreasing steadily. His overall blood glucose range is 130-140 mg/dl. (He did not bring his logs with him nor his meter.  He reports his glycemic profile ranges between 120 and 158 mg per DL.  His point-of-care A1c 6.8%, improving from 7.5%.) An ACE inhibitor/angiotensin II receptor blocker is contraindicated (He has documented allergy for ACE inhibitor's). He does not see a podiatrist.Eye exam is not current.  Hyperlipidemia This is a chronic problem. The current episode started more than 1 year ago. Exacerbating diseases include diabetes and obesity. Pertinent negatives include no chest pain, myalgias or shortness of breath. Current antihyperlipidemic treatment includes statins. Risk factors for coronary artery disease include dyslipidemia, diabetes mellitus, family history, obesity, male sex, hypertension and a sedentary lifestyle.   Hypertension This is a chronic problem. The current episode started more than 1 year ago. Pertinent negatives include no chest pain, headaches, neck pain, palpitations or shortness of breath. Risk factors for coronary artery disease include diabetes mellitus, dyslipidemia, obesity, sedentary lifestyle and smoking/tobacco exposure. Past treatments include calcium channel blockers. Hypertensive end-organ damage includes CAD/MI.    Review of systems  Constitutional: + Minimally fluctuating body weight, current  Body mass index is 33.98 kg/m. , no fatigue, no subjective hyperthermia, no subjective hypothermia Eyes: no blurry vision, no xerophthalmia ENT: no sore throat, no nodules palpated in throat, no dysphagia/odynophagia, no hoarseness Cardiovascular: no Chest Pain, no Shortness of Breath, no palpitations, no leg swelling Respiratory: no cough, no shortness of breath Gastrointestinal: no Nausea/Vomiting/Diarhhea Musculoskeletal: no muscle/joint aches Skin: no rashes, no hyperemia Neurological: no tremors, no numbness, no tingling, no dizziness Psychiatric: no depression, no anxiety   Objective:    BP 129/86   Pulse 93   Ht 5\' 5"  (1.651 m)   Wt 204 lb 3.2 oz (92.6 kg)   BMI 33.98 kg/m   Wt Readings from Last 3 Encounters:  11/16/19 204 lb 3.2 oz (92.6 kg)  11/16/19 204 lb (92.5 kg)  11/11/19 204 lb 4.8 oz (92.7 kg)     Physical Exam- Limited  Constitutional:  Body mass index is 33.98 kg/m. , not in acute distress, normal state of mind Eyes:  EOMI, no exophthalmos Neck: Supple Thyroid: No gross goiter Respiratory: Adequate breathing efforts Musculoskeletal: no gross deformities, strength intact in all four extremities, no gross restriction of joint movements Skin:  no rashes, no hyperemia Neurological: no tremor with outstretched hands,    CMP     Component Value Date/Time   NA 138 10/11/2019 0406   K 4.3 10/11/2019 0406  CL 102 10/11/2019 0406   CO2 28 10/11/2019  0406   GLUCOSE 137 (H) 10/11/2019 0406   BUN 14 10/11/2019 0406   CREATININE 0.66 10/11/2019 0406   CREATININE 0.74 07/14/2019 0710   CALCIUM 9.3 10/11/2019 0406   PROT 7.2 10/11/2019 0406   ALBUMIN 3.6 10/11/2019 0406   AST 91 (H) 10/11/2019 0406   ALT 80 (H) 10/11/2019 0406   ALKPHOS 99 10/11/2019 0406   BILITOT 0.8 10/11/2019 0406   GFRNONAA >60 10/11/2019 0406   GFRNONAA 105 07/14/2019 0710   GFRAA >60 10/11/2019 0406   GFRAA 121 07/14/2019 0710    Diabetic Labs (most recent): Lab Results  Component Value Date   HGBA1C 6.8 (H) 10/07/2019   HGBA1C 7.5 (H) 07/14/2019   HGBA1C 7.3 (H) 03/11/2019     Lipid Panel ( most recent) Lipid Panel     Component Value Date/Time   CHOL 154 11/09/2019 0714   TRIG 319 (H) 11/09/2019 0714   HDL 30 (L) 11/09/2019 0714   CHOLHDL 5.1 (H) 11/09/2019 0714   VLDL 41 (H) 04/27/2017 0505   LDLCALC 86 11/09/2019 0714      Lab Results  Component Value Date   TSH 1.58 11/03/2018   TSH 1.79 11/17/2017   TSH 1.635 04/26/2017   TSH 2.564 06/27/2015   FREET4 1.5 11/03/2018   FREET4 1.4 11/17/2017      Assessment & Plan:   1. DM type 2 causing vascular disease ([place])  - Randy Hebert has currently uncontrolled symptomatic type 2 DM since  54 years of age.   He did not bring his logs with him nor his meter.  He reports his glycemic profile ranges between 120 and 158 mg per DL.  His point-of-care A1c 6.8%, improving from 7.5%.   -his diabetes is complicated by  coronary artery disease which required stent placement, obesity/sedentary life, chronic heavy smoking, and ABDULHAMID OLGIN remains at a high risk for more acute and chronic complications which include CAD, CVA, CKD, retinopathy, and neuropathy. These are all discussed in detail with the patient.  - I have counseled him on diet management and weight loss, by adopting a carbohydrate restricted/protein rich diet. -Still admits to dietary indiscretions including consumption of  sweets and sweetened beverages.   - he  admits there is a room for improvement in his diet and drink choices. -  Suggestion is made for him to avoid simple carbohydrates  from his diet including Cakes, Sweet Desserts / Pastries, Ice Cream, Soda (diet and regular), Sweet Tea, Candies, Chips, Cookies, Sweet Pastries,  Store Bought Juices, Alcohol in Excess of  1-2 drinks a day, Artificial Sweeteners, Coffee Creamer, and "Sugar-free" Products. This will help patient to have stable blood glucose profile and potentially avoid unintended weight gain.   - I encouraged him to switch to  unprocessed or minimally processed complex starch and increased protein intake (animal or plant source), fruits, and vegetables.  - he is advised to stick to a routine mealtimes to eat 3 meals  a day and avoid unnecessary snacks ( to snack only to correct hypoglycemia).    - I have approached him with the following individualized plan to manage diabetes and patient agrees:   -He has maintained reasonable control of his diabetes without insulin.  He is benefiting and tolerating low-dose glipizide.   -Given his history of coronary artery disease, will benefit from maintaining tight control of diabetes.   -He is advised to continue Metformin 1000 mg p.o.  twice daily-daily after breakfast and after supper.    -He is advised to continue glipizide 5 mg XL p.o. daily at breakfast.   -He is approached to continue monitoring blood glucose at least once a day-daily before breakfast.   -He is encouraged to call clinic for blood glucose readings less than 70 or greater than 200x3 in a week.   -Patient specific target  A1c;  LDL, HDL, Triglycerides, and  Waist Circumference were discussed in detail.  2) BP/HTN:  -His blood pressure is controlled to target.   He remains chronic heavy smoker. He has a documented allergy of ACE inhibitors anaphylaxis .  He is advised to continue amlodipine 10 mg p.o. daily, with as needed diuretics.   He is extensively counseled for smoking cessation.     3) Lipids/HPL: Recent lipid panel showed controlled LDL at 46, triglycerides still high at 215.  He is advised to avoid butter and fried food.  Due to elevated liver enzymes (which are mainly due to his alcohol abuse) his atorvastatin was lowered to 40 mg p.o. nightly.   -  Patient continues to use alcohol heavily, could explain the transaminitis.  His most recent labs show improvement in his liver enzymes.  He is advised to consider weaning of alcohol off.  4)  Weight/Diet: His BMI is 34.  He is a candidate for modest weight loss.  CDE Consult has been initiated and in progress,  exercise, and detailed carbohydrates information provided.  5) Chronic Care/Health Maintenance:  -he  is on Statin medications and  is encouraged to continue to follow up with Ophthalmology, Dentist,  Podiatrist at least yearly or according to recommendations, and advised to  Quit smoking ( is a chronic heavy smoker for the last 40 days), and consider weaning off of alcohol.  I have recommended yearly flu vaccine and pneumonia vaccination at least every 5 years; moderate intensity exercise for up to 150 minutes weekly; and  sleep for at least 7 hours a day.  - I advised patient to maintain close follow up with Rosita Fire, MD for primary care needs.   - Time spent on this patient care encounter:  35 min, of which > 50% was spent in  counseling and the rest reviewing his blood glucose logs , discussing his hypoglycemia and hyperglycemia episodes, reviewing his current and  previous labs / studies  ( including abstraction from other facilities) and medications  doses and developing a  long term treatment plan and documenting his care.   Please refer to Patient Instructions for Blood Glucose Monitoring and Insulin/Medications Dosing Guide"  in media tab for additional information. Please  also refer to " Patient Self Inventory" in the Media  tab for reviewed elements  of pertinent patient history.  Randy Hebert participated in the discussions, expressed understanding, and voiced agreement with the above plans.  All questions were answered to his satisfaction. he is encouraged to contact clinic should he have any questions or concerns prior to his return visit.    Follow up plan: - Return in about 3 months (around 02/16/2020) for Bring Meter and Logs- A1c in Office.  Glade Lloyd, MD [facility] [address] [place], [place] [address] Phone: [phone]  Fax: [phone]    11/16/2019, 6:30 PM  This note was partially dictated with voice recognition software. Similar sounding words can be transcribed inadequately or may not  be corrected upon review.

## 2019-11-16 NOTE — Patient Instructions (Signed)

## 2019-11-16 NOTE — Progress Notes (Signed)
  Medical Nutrition Therapy:  Appt start time: 1130end time:  1200   Assessment:  Primary concerns today: Diabetes Type 2.  A1C 6.8% . Saw Dr. Dorris Fetch today. Cut out sodas and drinking water only. Has cut down on eating  processed food  and more fresh fruits and vegetables. Lost 5 lbs. Working on quitting smoking. Smokes about 6 cigarettes a day.  Had pneumonia/COVID a month ago. Wants to continue to eat better and lose weight. Metformin 1000 mg BID and Glipizide. Needs to get back testing in am.  Lab Results  Component Value Date   HGBA1C 6.8 (H) 10/07/2019   Lipid Panel     Component Value Date/Time   CHOL 154 11/09/2019 0714   TRIG 319 (H) 11/09/2019 0714   HDL 30 (L) 11/09/2019 0714   CHOLHDL 5.1 (H) 11/09/2019 0714   VLDL 41 (H) 04/27/2017 0505   LDLCALC 86 11/09/2019 0714   CMP Latest Ref Rng & Units 10/11/2019 10/10/2019 10/09/2019  Glucose 70 - 99 mg/dL 137(H) 116(H) 143(H)  BUN 6 - 20 mg/dL 14 12 13   Creatinine 0.61 - 1.24 mg/dL 0.66 0.76 0.59(L)  Sodium 135 - 145 mmol/L 138 139 137  Potassium 3.5 - 5.1 mmol/L 4.3 4.2 4.0  Chloride 98 - 111 mmol/L 102 100 99  CO2 22 - 32 mmol/L 28 29 26   Calcium 8.9 - 10.3 mg/dL 9.3 9.2 9.3  Total Protein 6.5 - 8.1 g/dL 7.2 7.3 7.3  Total Bilirubin 0.3 - 1.2 mg/dL 0.8 0.8 0.9  Alkaline Phos 38 - 126 U/L 99 103 104  AST 15 - 41 U/L 91(H) 73(H) 39  ALT 0 - 44 U/L 80(H) 53(H) 36    Preferred Learning Style:   No preference indicated   Learning Readiness:   Ready  Change in progress   MEDICATIONS: See chart   B Boiled egg, toast 1 slice, oatmeal and milk. L) Cream of chicken soup, crackers 10, unsweet tea D ( PM):  [place] sandwichSnk ( PM):  Beverages:Usual physical activity: walking some a few times per week.  Estimated energy needs: 1800  calories 200 g carbohydrates 135 g protein 50 g fat  Progress Towards Goal(s):  In progress.   Nutritional Diagnosis:  NB-1.1 Food and nutrition-related knowledge deficit As related  to Diabetes.  As evidenced by A1C 7.6%.    Intervention: Nutrition and Diabetes education provided on My Plate, CHO counting, meal planning, portion sizes, timing of meals, avoiding snacks between meals unless having a low blood sugar, target ranges for A1C and blood sugars, signs/symptoms and treatment of hyper/hypoglycemia, monitoring blood sugars, taking medications as prescribed, benefits of exercising 30 minutes per day and prevention of complications of DM.  Goals  Quit smoking by April 1st. Increase fresh fruits and vegetables Walk 30 minutes a day Get A1C 6.5% or less Lose 5 lbs per month   Teaching Method Utilized:  Visual Auditory Hands on  Handouts given during visit include:  The Plate Method  Meal Plan Card  Diabetes Instructions.   Barriers to learning/adherence to lifestyle change: none  Demonstrated degree of understanding via:  Teach Back   Monitoring/Evaluation:  Dietary intake, exercise, meal planning, SBG, and body weight in 3 month(s).

## 2019-11-18 ENCOUNTER — Ambulatory Visit (INDEPENDENT_AMBULATORY_CARE_PROVIDER_SITE_OTHER): Payer: Medicare Other

## 2019-11-18 DIAGNOSIS — J309 Allergic rhinitis, unspecified: Secondary | ICD-10-CM

## 2019-11-22 DIAGNOSIS — I2510 Atherosclerotic heart disease of native coronary artery without angina pectoris: Secondary | ICD-10-CM | POA: Diagnosis not present

## 2019-11-22 DIAGNOSIS — K219 Gastro-esophageal reflux disease without esophagitis: Secondary | ICD-10-CM | POA: Diagnosis not present

## 2019-11-23 ENCOUNTER — Encounter (HOSPITAL_COMMUNITY): Payer: Self-pay

## 2019-11-23 ENCOUNTER — Encounter: Payer: Self-pay | Admitting: Internal Medicine

## 2019-11-23 ENCOUNTER — Ambulatory Visit (INDEPENDENT_AMBULATORY_CARE_PROVIDER_SITE_OTHER): Payer: Medicare Other | Admitting: Internal Medicine

## 2019-11-23 ENCOUNTER — Other Ambulatory Visit: Payer: Self-pay

## 2019-11-23 VITALS — BP 126/72 | HR 91 | Temp 97.5°F | Ht 65.0 in | Wt 200.0 lb

## 2019-11-23 DIAGNOSIS — E782 Mixed hyperlipidemia: Secondary | ICD-10-CM | POA: Diagnosis not present

## 2019-11-23 DIAGNOSIS — R55 Syncope and collapse: Secondary | ICD-10-CM | POA: Diagnosis not present

## 2019-11-23 DIAGNOSIS — F17200 Nicotine dependence, unspecified, uncomplicated: Secondary | ICD-10-CM | POA: Diagnosis not present

## 2019-11-23 LAB — CUP PACEART INCLINIC DEVICE CHECK
Date Time Interrogation Session: 20210324143717
Implantable Pulse Generator Implant Date: 20181029

## 2019-11-23 NOTE — Progress Notes (Signed)
HPI Randy Hebert returns today for followup of his ILR, s/p placement due to syncope. He denies chest pain or sob. He was hospitalized with pneumonia. He has lost 30 lbs. He has no edema and no syncope. He sometimes will have an excess of secretions and start coughing.  Allergies  Allergen Reactions  . Carvedilol Other (See Comments)    Sinus pause on telemetry >3 seconds. Longest one 9 sec. No AV nodal agent  . Lisinopril Anaphylaxis, Shortness Of Breath and Swelling    Angioedema, required intubation and mechanical ventilation  . Amoxicillin Nausea And Vomiting and Nausea Only    Nausea only - no allergy  Did it involve swelling of the face/tongue/throat, SOB, or low BP? No Did it involve sudden or severe rash/hives, skin peeling, or any reaction on the inside of your mouth or nose? No Did you need to seek medical attention at a hospital or doctor's office? No When did it last happen?childhood reaction If all above answers are "NO", may proceed with cephalosporin use.      Current Outpatient Medications  Medication Sig Dispense Refill  . acetaminophen (TYLENOL) 500 MG tablet Take 1,000 mg by mouth every 4 (four) hours as needed for mild pain or fever.     Marland Kitchen albuterol (PROVENTIL) (2.5 MG/3ML) 0.083% nebulizer solution Take 3 mLs (2.5 mg total) by nebulization every 6 (six) hours as needed for wheezing or shortness of breath. 150 mL 1  . amLODipine (NORVASC) 10 MG tablet TAKE 1 TABLET BY MOUTH ONCE A DAY. (Patient taking differently: Take 10 mg by mouth daily. ) 30 tablet 0  . ascorbic acid (VITAMIN C) 500 MG tablet Take 1 tablet (500 mg total) by mouth daily. (Patient taking differently: Take 500 mg by mouth every evening. ) 30 tablet 1  . aspirin 81 MG EC tablet Take 1 tablet (81 mg total) by mouth daily. (Patient taking differently: Take 81 mg by mouth 2 (two) times daily. ) 30 tablet   . atorvastatin (LIPITOR) 40 MG tablet TAKE 1 TABLET BY MOUTH ONCE A DAY. (Patient  taking differently: Take 40 mg by mouth every evening. ) 30 tablet 0  . azelastine (ASTELIN) 0.1 % nasal spray USE 2 SPRAYS IN EACH NOSTRIL TWICE DAILY. (Patient taking differently: Place 2 sprays into both nostrils 2 (two) times daily. ) 30 mL 2  . budesonide-formoterol (SYMBICORT) 160-4.5 MCG/ACT inhaler Inhale 2 puffs into the lungs 2 (two) times daily. 1 Inhaler 5  . Carboxymeth-Glycerin-Polysorb (REFRESH OPTIVE ADVANCED) 0.5-1-0.5 % SOLN Apply 1 drop to eye 2 (two) times daily as needed (dry eyes).     . Cholecalciferol (VITAMIN D3) 125 MCG (5000 UT) CAPS Take 1 capsule (5,000 Units total) by mouth daily. (Patient taking differently: Take 5,000 Units by mouth daily in the afternoon. ) 90 capsule 0  . citalopram (CELEXA) 20 MG tablet Take 20 mg by mouth daily.    . cycloSPORINE (RESTASIS) 0.05 % ophthalmic emulsion Place 1 drop into both eyes 2 (two) times daily.    . ferrous sulfate 325 (65 FE) MG tablet Take 325 mg by mouth daily with breakfast.    . furosemide (LASIX) 40 MG tablet Take 1 tablet (40 mg total) by mouth daily as needed for fluid. (Patient taking differently: Take 40 mg by mouth daily as needed for fluid (2-3x's a week). )    . glipiZIDE (GLUCOTROL XL) 5 MG 24 hr tablet Take 1 tablet (5 mg total) by mouth daily with  breakfast. 30 tablet 3  . Magnesium 200 MG TABS Take 1 tablet (200 mg total) by mouth daily. (Patient taking differently: Take 200 mg by mouth every evening. ) 30 each 6  . metFORMIN (GLUCOPHAGE) 1000 MG tablet Take 1 tablet (1,000 mg total) by mouth 2 (two) times daily with a meal. 180 tablet 3  . mometasone (ELOCON) 0.1 % cream Apply 1 application topically 2 (two) times a week.     . nitroGLYCERIN (NITROSTAT) 0.4 MG SL tablet Place 1 tablet (0.4 mg total) under the tongue every 5 (five) minutes as needed for chest pain. 25 tablet 0  . pantoprazole (PROTONIX) 40 MG tablet TAKE 1 TABLET BY MOUTH ONCE A DAY. (Patient taking differently: Take 40 mg by mouth daily. ) 30  tablet 5  . PAZEO 0.7 % SOLN Place 1 drop into both eyes daily.     . prednisoLONE acetate (PRED FORTE) 1 % ophthalmic suspension Place 4 drops into both eyes 2 (two) times a week.     Marland Kitchen PROVENTIL HFA 108 (90 Base) MCG/ACT inhaler INHALE 2 PUFFS INTO THE LUNGS EVERY 6 HOURS AS NEEDED FOR SHORTNESS OF BREATH OR WHEEZING. (Patient taking differently: Inhale 2 puffs into the lungs every 6 (six) hours as needed for wheezing or shortness of breath. ) 18 g 1  . SPIRIVA RESPIMAT 2.5 MCG/ACT AERS INHALE 1 PUFF INTO THE LUNGS DAILY. (Patient taking differently: Inhale 1 puff into the lungs daily. ) 4 g 2  . dexamethasone (DECADRON) 6 MG tablet Take 1 tablet (6 mg total) by mouth daily. (Patient not taking: Reported on 11/21/2019) 6 tablet 0   No current facility-administered medications for this visit.     Past Medical History:  Diagnosis Date  . Allergy   . Angio-edema   . Anxiety   . Arthritis   . Asthma    hip replacement  . Back pain   . Bulging of cervical intervertebral disc   . CAD (coronary artery disease)    lateral STEMI 02/06/2015 00% D1 occlusion treated with Promus Premier 2.5 mm x 16 mm DES, 70% ramus stenosis, 40% mid RCA stenosis, 45% distal RCA stenosis, EF 45-50%  . CHF (congestive heart failure) ([place])   . COPD (chronic obstructive pulmonary disease) ([place])   . Depression   . Diabetes mellitus without complication ([place])   . 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 Dry eye   . Dyspnea   . Early satiety 09/23/2016  . GERD (gastroesophageal reflux disease)   . Headache   . Heart murmur   . Hip pain   . Hyperlipidemia   . Hypertension   . MI (myocardial infarction) ([place])   . Myocardial infarction ([place])   . Neck pain   . Otitis media   . Pleurisy   . Sinus pause    9 sec sinus pause on telemetry after started on coreg after MI, avoid AV nodal blocking agent  . Sleep apnea   . 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 Unilateral primary  osteoarthritis, right hip 07/01/2016    ROS:   All systems reviewed and negative except as noted in the HPI.   Past Surgical History:  Procedure Laterality Date  . BIOPSY  10/09/2016   Procedure: BIOPSY;  Surgeon: Daneil Dolin, MD;  [facility];  Service: Endoscopy;;  . CARDIAC CATHETERIZATION N/A 02/06/2015   Procedure: Left Heart Cath and Coronary Angiography;  Surgeon:  Leonie Man, MD;  Location: [facility];  Service: Cardiovascular;  Laterality: N/A;  . CARDIAC CATHETERIZATION N/A 02/06/2015   Procedure: Coronary Stent Intervention;  Surgeon: Leonie Man, MD;  Location: [facility];  Service: Cardiovascular;  Laterality: N/A;  . COLONOSCOPY WITH PROPOFOL N/A 10/09/2016   Procedure: COLONOSCOPY WITH PROPOFOL;  Surgeon: Daneil Dolin, MD;  [facility];  Service: Endoscopy;  Laterality: N/A;  12:00 pm  . COLONOSCOPY WITH PROPOFOL N/A 11/24/2016   Dr. Gala Romney: Multiple colon polyps removed, tubular adenoma.  Next colonoscopy in 5 years.  . CORONARY ANGIOPLASTY WITH STENT PLACEMENT  01/2015  . CORONARY STENT INTERVENTION N/A 04/27/2017   Procedure: CORONARY STENT INTERVENTION;  Surgeon: Nelva Bush, MD;  Location: [facility];  Service: Cardiovascular;  Laterality: N/A;  . ELECTROPHYSIOLOGY STUDY N/A 06/29/2017   Procedure: ELECTROPHYSIOLOGY STUDY;  Surgeon: Evans Lance, MD;  Location: [facility];  Service: Cardiovascular;  Laterality: N/A;  . ESOPHAGOGASTRODUODENOSCOPY (EGD) WITH PROPOFOL N/A 10/09/2016   Dr. Gala Romney: LA grade a esophagitis.  Barrett's esophagus, biopsy-proven.  Small hiatal hernia.  EGD February 2019  . INCISION / DRAINAGE HAND / FINGER    . LEFT HEART CATH AND CORONARY ANGIOGRAPHY N/A 04/27/2017   Procedure: LEFT HEART CATH AND CORONARY ANGIOGRAPHY;  Surgeon: Nelva Bush, MD;  Location: [facility];  Service: Cardiovascular;  Laterality: N/A;  . LOOP RECORDER INSERTION  06/29/2017   Procedure: Loop  Recorder Insertion;  Surgeon: Evans Lance, MD;  Location: [facility];  Service: Cardiovascular;;  . POLYPECTOMY  11/24/2016   Procedure: POLYPECTOMY;  Surgeon: Daneil Dolin, MD;  [facility];  Service: Endoscopy;;  descending and sigmoid  . TOTAL HIP ARTHROPLASTY Right 07/01/2016  . TOTAL HIP ARTHROPLASTY Right 07/01/2016   Procedure: RIGHT TOTAL HIP ARTHROPLASTY ANTERIOR APPROACH;  Surgeon: Mcarthur Rossetti, MD;  Location: [facility];  Service: Orthopedics;  Laterality: Right;     Family History  Problem Relation Age of Onset  . Heart attack Father   . Stroke Father   . Arthritis Father   . Heart disease Father   . Cancer Mother        ???  . Arthritis Mother   . Heart disease Brother 84       died in sleep  . Early death Brother   . Diabetes Maternal Uncle   . Alzheimer's disease Maternal Grandmother      Social History   Socioeconomic History  . Marital status: Significant Other    Spouse name: alice  . Number of children: 3  . Years of education: 68  . Highest education level: Not on file  Occupational History  . Occupation: unemployed    Comment: Nature conservation officer    Comment: disability pending  Tobacco Use  . Smoking status: Current Every Day Smoker    Packs/day: 1.50    Years: 46.00    Pack years: 69.00    Types: Cigarettes  . Smokeless tobacco: Former Systems developer    Types: Chew  Substance and Sexual Activity  . Alcohol use: Yes    Alcohol/week: 0.0 standard drinks    Comment: pint of whiskey daily  . Drug use: No    Comment: history of drug use- marijuana, cocaine- quit 2014  . Sexual activity: Yes    Partners: Female    Birth control/protection: None  Other Topics Concern  . Not on file  Social History Narrative  Lives with girlfriend [name]   Leisure - TV   Lost his son 07-03-2018 due to overdose   Social Determinants of Health   Financial Resource Strain:   . Difficulty of Paying Living Expenses:   Food Insecurity:   .  Worried About Charity fundraiser in the Last Year:   . Arboriculturist in the Last Year:   Transportation Needs:   . Film/video editor (Medical):   Marland Kitchen Lack of Transportation (Non-Medical):   Physical Activity:   . Days of Exercise per Week:   . Minutes of Exercise per Session:   Stress:   . Feeling of Stress :   Social Connections:   . Frequency of Communication with Friends and Family:   . Frequency of Social Gatherings with Friends and Family:   . Attends Religious Services:   . Active Member of Clubs or Organizations:   . Attends Archivist Meetings:   Marland Kitchen Marital Status:   Intimate Partner Violence:   . Fear of Current or Ex-Partner:   . Emotionally Abused:   Marland Kitchen Physically Abused:   . Sexually Abused:      BP 126/72   Pulse 91   Temp (!) 97.5 F (36.4 C)   Ht 5\' 5"  (1.651 m)   Wt 200 lb (90.7 kg)   BMI 33.28 kg/m   Physical Exam:  Well appearing NAD HEENT: Unremarkable Neck:  No JVD, no thyromegally Lymphatics:  No adenopathy Back:  No CVA tenderness Lungs:  Clear with no wheezes HEART:  Regular rate rhythm, no murmurs, no rubs, no clicks Abd:  soft, positive bowel sounds, no organomegally, no rebound, no guarding Ext:  2 plus pulses, no edema, no cyanosis, no clubbing Skin:  No rashes no nodules Neuro:  CN II through XII intact, motor grossly intact  DEVICE  Normal device function.  See PaceArt for details. One nocturnal pause   Assess/Plan: 1. Syncope - he has had no recurrent symptoms. We will follow. 2. Obesity - he has lost weight. I encouraged him to lose more. 3. Copd - he has a chronic cough. He will continue his bronchodilators.  Mikle [email or web address].D.

## 2019-11-23 NOTE — Patient Instructions (Signed)
Medication Instructions:  Your physician recommends that you continue on your current medications as directed. Please refer to the Current Medication list given to you today.  *If you need a refill on your cardiac medications before your next appointment, please call your pharmacy*   Lab Work: None  If you have labs (blood work) drawn today and your tests are completely normal, you will receive your results only by: Marland Kitchen MyChart Message (if you have MyChart) OR . A paper copy in the mail If you have any lab test that is abnormal or we need to change your treatment, we will call you to review the results.   Testing/Procedures: NONE    Follow-Up: At [facility], you and your health needs are our priority.  As part of our continuing mission to provide you with exceptional heart care, we have created designated Provider Care Teams.  These Care Teams include your primary Cardiologist (physician) and Advanced Practice Providers (APPs -  Physician Assistants and Nurse Practitioners) who all work together to provide you with the care you need, when you need it.  We recommend signing up for the patient portal called "MyChart".  Sign up information is provided on this After Visit Summary.  MyChart is used to connect with patients for Virtual Visits (Telemedicine).  Patients are able to view lab/test results, encounter notes, upcoming appointments, etc.  Non-urgent messages can be sent to your provider as well.   To learn more about what you can do with MyChart, go to [email or web address].    Your next appointment:   9 month(s)  The format for your next appointment:   In Person  Provider:   Cristopher Peru, MD   Other Instructions Thank you for choosing [facility]!

## 2019-11-24 ENCOUNTER — Encounter (HOSPITAL_COMMUNITY)
Admission: RE | Admit: 2019-11-24 | Discharge: 2019-11-24 | Disposition: A | Discharge status: Home / Self Care | Payer: Medicare Other | Source: Ambulatory Visit | Attending: Internal Medicine | Admitting: Internal Medicine

## 2019-11-24 ENCOUNTER — Other Ambulatory Visit (HOSPITAL_COMMUNITY)
Admission: RE | Admit: 2019-11-24 | Discharge: 2019-11-24 | Disposition: A | Discharge status: Home / Self Care | Payer: Medicare Other | Source: Ambulatory Visit | Attending: Internal Medicine | Admitting: Internal Medicine

## 2019-11-24 ENCOUNTER — Encounter: Payer: Medicare Other | Admitting: Internal Medicine

## 2019-11-24 DIAGNOSIS — Z20822 Contact with and (suspected) exposure to covid-19: Secondary | ICD-10-CM | POA: Insufficient documentation

## 2019-11-24 DIAGNOSIS — Z01812 Encounter for preprocedural laboratory examination: Secondary | ICD-10-CM | POA: Insufficient documentation

## 2019-11-25 LAB — SARS CORONAVIRUS 2 (TAT 6-24 HRS): SARS Coronavirus 2: NEGATIVE

## 2019-11-28 ENCOUNTER — Ambulatory Visit (HOSPITAL_COMMUNITY): Payer: Medicare Other | Admitting: Anesthesiology

## 2019-11-28 ENCOUNTER — Encounter (HOSPITAL_COMMUNITY)
Admission: RE | Disposition: A | Discharge status: Home / Self Care | Payer: Self-pay | Source: Home / Self Care | Attending: Internal Medicine

## 2019-11-28 ENCOUNTER — Encounter (HOSPITAL_COMMUNITY): Payer: Self-pay | Admitting: Internal Medicine

## 2019-11-28 ENCOUNTER — Ambulatory Visit (HOSPITAL_COMMUNITY)
Admission: RE | Admit: 2019-11-28 | Discharge: 2019-11-28 | Disposition: A | Discharge status: Home / Self Care | Payer: Medicare Other | Attending: Internal Medicine | Admitting: Internal Medicine

## 2019-11-28 DIAGNOSIS — G4730 Sleep apnea, unspecified: Secondary | ICD-10-CM | POA: Insufficient documentation

## 2019-11-28 DIAGNOSIS — J45909 Unspecified asthma, uncomplicated: Secondary | ICD-10-CM | POA: Insufficient documentation

## 2019-11-28 DIAGNOSIS — J449 Chronic obstructive pulmonary disease, unspecified: Secondary | ICD-10-CM | POA: Insufficient documentation

## 2019-11-28 DIAGNOSIS — F419 Anxiety disorder, unspecified: Secondary | ICD-10-CM | POA: Insufficient documentation

## 2019-11-28 DIAGNOSIS — I252 Old myocardial infarction: Secondary | ICD-10-CM | POA: Insufficient documentation

## 2019-11-28 DIAGNOSIS — Z96641 Presence of right artificial hip joint: Secondary | ICD-10-CM | POA: Diagnosis not present

## 2019-11-28 DIAGNOSIS — I864 Gastric varices: Secondary | ICD-10-CM | POA: Insufficient documentation

## 2019-11-28 DIAGNOSIS — I509 Heart failure, unspecified: Secondary | ICD-10-CM | POA: Insufficient documentation

## 2019-11-28 DIAGNOSIS — Z7982 Long term (current) use of aspirin: Secondary | ICD-10-CM | POA: Diagnosis not present

## 2019-11-28 DIAGNOSIS — I8510 Secondary esophageal varices without bleeding: Secondary | ICD-10-CM | POA: Insufficient documentation

## 2019-11-28 DIAGNOSIS — I2510 Atherosclerotic heart disease of native coronary artery without angina pectoris: Secondary | ICD-10-CM | POA: Insufficient documentation

## 2019-11-28 DIAGNOSIS — Z955 Presence of coronary angioplasty implant and graft: Secondary | ICD-10-CM | POA: Insufficient documentation

## 2019-11-28 DIAGNOSIS — K766 Portal hypertension: Secondary | ICD-10-CM | POA: Diagnosis not present

## 2019-11-28 DIAGNOSIS — I110 Hypertensive heart disease with heart failure: Secondary | ICD-10-CM | POA: Diagnosis not present

## 2019-11-28 DIAGNOSIS — E785 Hyperlipidemia, unspecified: Secondary | ICD-10-CM | POA: Diagnosis not present

## 2019-11-28 DIAGNOSIS — K3189 Other diseases of stomach and duodenum: Secondary | ICD-10-CM | POA: Insufficient documentation

## 2019-11-28 DIAGNOSIS — K2270 Barrett's esophagus without dysplasia: Secondary | ICD-10-CM | POA: Diagnosis not present

## 2019-11-28 DIAGNOSIS — Z7951 Long term (current) use of inhaled steroids: Secondary | ICD-10-CM | POA: Insufficient documentation

## 2019-11-28 DIAGNOSIS — F329 Major depressive disorder, single episode, unspecified: Secondary | ICD-10-CM | POA: Insufficient documentation

## 2019-11-28 DIAGNOSIS — Z79899 Other long term (current) drug therapy: Secondary | ICD-10-CM | POA: Diagnosis not present

## 2019-11-28 DIAGNOSIS — K2100 Gastro-esophageal reflux disease with esophagitis, without bleeding: Secondary | ICD-10-CM | POA: Diagnosis not present

## 2019-11-28 DIAGNOSIS — K449 Diaphragmatic hernia without obstruction or gangrene: Secondary | ICD-10-CM | POA: Insufficient documentation

## 2019-11-28 DIAGNOSIS — Z09 Encounter for follow-up examination after completed treatment for conditions other than malignant neoplasm: Secondary | ICD-10-CM | POA: Diagnosis present

## 2019-11-28 HISTORY — PX: BIOPSY: SHX5522

## 2019-11-28 HISTORY — PX: ESOPHAGOGASTRODUODENOSCOPY (EGD) WITH PROPOFOL: SHX5813

## 2019-11-28 LAB — GLUCOSE, CAPILLARY: Glucose-Capillary: 134 mg/dL — ABNORMAL HIGH (ref 70–99)

## 2019-11-28 SURGERY — ESOPHAGOGASTRODUODENOSCOPY (EGD) WITH PROPOFOL
Anesthesia: General

## 2019-11-28 MED ORDER — LACTATED RINGERS IV SOLN: Freq: Once | INTRAVENOUS | Status: AC

## 2019-11-28 MED ORDER — LIDOCAINE HCL (CARDIAC) PF 100 MG/5ML IV SOSY
PREFILLED_SYRINGE | INTRAVENOUS | Status: DC | PRN
  Administered 2019-11-28: 50 mg via INTRATRACHEAL

## 2019-11-28 MED ORDER — GLYCOPYRROLATE 0.2 MG/ML IJ SOLN
INTRAMUSCULAR | Status: DC | PRN
  Administered 2019-11-28: .1 mg via INTRAVENOUS

## 2019-11-28 MED ORDER — KETAMINE HCL 10 MG/ML IJ SOLN
INTRAMUSCULAR | Status: DC | PRN
  Administered 2019-11-28: 10 mg via INTRAVENOUS
  Administered 2019-11-28: 40 mg via INTRAVENOUS

## 2019-11-28 MED ORDER — CHLORHEXIDINE GLUCONATE CLOTH 2 % EX PADS: 6.0000 | MEDICATED_PAD | Freq: Once | CUTANEOUS | Status: DC

## 2019-11-28 MED ORDER — PROPOFOL 10 MG/ML IV BOLUS
INTRAVENOUS | Status: AC
  Filled 2019-11-28: qty 40

## 2019-11-28 MED ORDER — KETAMINE HCL 50 MG/5ML IJ SOSY
PREFILLED_SYRINGE | INTRAMUSCULAR | Status: AC
  Filled 2019-11-28: qty 5

## 2019-11-28 MED ORDER — LACTATED RINGERS IV SOLN: INTRAVENOUS | Status: DC | PRN

## 2019-11-28 MED ORDER — PROPOFOL 10 MG/ML IV BOLUS
INTRAVENOUS | Status: DC | PRN
  Administered 2019-11-28: 20 mg via INTRAVENOUS

## 2019-11-28 MED ORDER — PROPOFOL 500 MG/50ML IV EMUL
INTRAVENOUS | Status: DC | PRN
  Administered 2019-11-28: 150 ug/kg/min via INTRAVENOUS

## 2019-11-28 MED ORDER — IPRATROPIUM-ALBUTEROL 0.5-2.5 (3) MG/3ML IN SOLN
3.0000 mL | Freq: Once | RESPIRATORY_TRACT | Status: AC
  Administered 2019-11-28: 3 mL via RESPIRATORY_TRACT

## 2019-11-28 NOTE — Op Note (Signed)
[facility] Patient Name: Randy Hebert Procedure Date: 11/28/2019 7:20 AM MRN: [id number] Date of Birth: [birth year] Attending MD: Norvel Richards , MD CSN: [id number] Age: 54 Admit Type: Outpatient Procedure:                Upper GI endoscopy Indications:              Surveillance for malignancy due to personal history                            of Barrett's esophagus Providers:                Norvel Richards, MD, Otis Peak B. Sharon Seller, RN,                            Nelma Rothman, Technician Referring MD:              Medicines:                Propofol per Anesthesia Complications:            No immediate complications. Estimated Blood Loss:     Estimated blood loss was minimal. Procedure:                Pre-Anesthesia Assessment:                           - Prior to the procedure, a History and Physical                            was performed, and patient medications and                            allergies were reviewed. The patient's tolerance of                            previous anesthesia was also reviewed. The risks                            and benefits of the procedure and the sedation                            options and risks were discussed with the patient.                            All questions were answered, and informed consent                            was obtained. Prior Anticoagulants: The patient has                            taken no previous anticoagulant or antiplatelet                            agents. ASA Grade Assessment: II - A patient with  mild systemic disease. After reviewing the risks                            and benefits, the patient was deemed in                            satisfactory condition to undergo the procedure.                           After obtaining informed consent, the endoscope was                            passed under direct vision. Throughout the                            procedure,  the patient's blood pressure, pulse, and                            oxygen saturations were monitored continuously. The                            GIF-H190 ([id number]) scope was introduced through the                            mouth, and advanced to the second part of duodenum.                            The upper GI endoscopy was accomplished without                            difficulty. The patient tolerated the procedure                            well. Scope In: 7:41:29 AM Scope Out: 7:47:25 AM Total Procedure Duration: 0 hours 5 minutes 56 seconds  Findings:      Patulous EG junction. Undulating Z-line with 2 cm tongue salmon-colored       epithelium coming up above it. No esophagitis no nodularity. Esophageal       varices.      A small hiatal hernia was present. Fish scale or snakeskin and of the       gastric mucosa diffusely consistent with mild gastropathy. Gastric       varices seen      The duodenal bulb and second portion of the duodenum were normal.       Abnormal distal esophagus was biopsied with a cold forceps for       histology. Estimated blood loss was minimal. Impression:               -Esophageal Salmon-colored mucosa. Biopsied.                           - Small hiatal hernia.                           - Portal hypertensive gastropathy.                           -  Normal duodenal bulb and second portion of the                            duodenum. Moderate Sedation:      Moderate (conscious) sedation was administered by the endoscopy nurse       and supervised by the endoscopist. The following parameters were       monitored: oxygen saturation, heart rate, blood pressure, respiratory       rate, EKG, adequacy of pulmonary ventilation, and response to care. Recommendation:           - Patient has a contact number available for                            emergencies. The signs and symptoms of potential                            delayed complications were discussed  with the                            patient. Return to normal activities tomorrow.                            Written discharge instructions were provided to the                            patient.                           - Resume previous diet.                           - Continue present medications. Tinea Protonix 40                            mg daily indefinitely                           - Repeat upper endoscopy in 3 years for                            surveillance. Follow-up on pathology.                           - Return to GI clinic in 3 months. Procedure Code(s):        --- Professional ---                           [phone], Esophagogastroduodenoscopy, flexible,                            transoral; with biopsy, single or multiple Diagnosis Code(s):        --- Professional ---                           K22.70, Barrett's esophagus without dysplasia  K44.9, Diaphragmatic hernia without obstruction or                            gangrene                           K76.6, Portal hypertension                           K31.89, Other diseases of stomach and duodenum CPT copyright 2019 American Medical Association. All rights reserved. The codes documented in this report are preliminary and upon coder review may  be revised to meet current compliance requirements. Cristopher Estimable. Virgilio Broadhead, MD Norvel Richards, MD 11/28/2019 8:40:15 AM This report has been signed electronically. Number of Addenda: 0

## 2019-11-28 NOTE — Anesthesia Postprocedure Evaluation (Signed)
Anesthesia Post Note  Patient: Randy Hebert  Procedure(s) Performed: ESOPHAGOGASTRODUODENOSCOPY (EGD) WITH PROPOFOL (N/A ) BIOPSY  Patient location during evaluation: PACU Anesthesia Type: General Level of consciousness: awake and alert Pain management: pain level controlled Vital Signs Assessment: post-procedure vital signs reviewed and stable Respiratory status: spontaneous breathing, nonlabored ventilation and respiratory function stable Cardiovascular status: stable Postop Assessment: no apparent nausea or vomiting Anesthetic complications: no     Last Vitals:  Vitals:   11/28/19 0647  Pulse: (P) 92  Resp: (P) 20  Temp: (P) 36.9 C  SpO2: 96%    Last Pain:  Vitals:   11/28/19 0734  TempSrc:   PainSc: 0-No pain                 Johnetta Sloniker [place]

## 2019-11-28 NOTE — Transfer of Care (Signed)
Immediate Anesthesia Transfer of Care Note  Patient: Randy Hebert  Procedure(s) Performed: ESOPHAGOGASTRODUODENOSCOPY (EGD) WITH PROPOFOL (N/A ) BIOPSY  Patient Location: PACU  Anesthesia Type:General  Level of Consciousness: awake  Airway & Oxygen Therapy: Patient Spontanous Breathing  Post-op Assessment: Report given to RN and Post -op Vital signs reviewed and stable  Post vital signs: Reviewed and stable  Last Vitals:  Vitals Value Taken Time  BP    Temp    Pulse 89 11/28/19 0754  Resp 22 11/28/19 0754  SpO2 91 % 11/28/19 0754  Vitals shown include unvalidated device data.  Last Pain:  Vitals:   11/28/19 0734  TempSrc:   PainSc: 0-No pain         Complications: No apparent anesthesia complications

## 2019-11-28 NOTE — H&P (Signed)
@LOGO @   Primary Care Physician:  Rosita Fire, MD Primary Gastroenterologist:  Dr. Gala Romney  Pre-Procedure History & Physical: HPI:  Randy Hebert is a 54 y.o. male here for surveillance EGD history of Barrett's esophagus.  GERD well-controlled on Protonix.  No dysphagia.  Past Medical History:  Diagnosis Date  . Allergy   . Angio-edema   . Anxiety   . Arthritis   . Asthma    hip replacement  . Back pain   . Bulging of cervical intervertebral disc   . CAD (coronary artery disease)    lateral STEMI 02/06/2015 00% D1 occlusion treated with Promus Premier 2.5 mm x 16 mm DES, 70% ramus stenosis, 40% mid RCA stenosis, 45% distal RCA stenosis, EF 45-50%  . CHF (congestive heart failure) ([place])   . COPD (chronic obstructive pulmonary disease) ([place])   . Depression   . Diabetes mellitus without complication ([place])   . 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 Dry eye   . Dyspnea   . Early satiety 09/23/2016  . GERD (gastroesophageal reflux disease)   . Headache   . Heart murmur   . Hip pain   . Hyperlipidemia   . Hypertension   . MI (myocardial infarction) ([place])   . Myocardial infarction ([place])   . Neck pain   . Otitis media   . Pleurisy   . Sinus pause    9 sec sinus pause on telemetry after started on coreg after MI, avoid AV nodal blocking agent  . Sleep apnea   . 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 Unilateral primary osteoarthritis, right hip 07/01/2016    Past Surgical History:  Procedure Laterality Date  . BIOPSY  10/09/2016   Procedure: BIOPSY;  Surgeon: Daneil Dolin, MD;  [facility];  Service: Endoscopy;;  . CARDIAC CATHETERIZATION N/A 02/06/2015   Procedure: Left Heart Cath and Coronary Angiography;  Surgeon: Leonie Man, MD;  Location: [facility];  Service: Cardiovascular;  Laterality: N/A;  . CARDIAC CATHETERIZATION N/A 02/06/2015   Procedure: Coronary Stent Intervention;  Surgeon: Leonie Man, MD;   Location: [facility];  Service: Cardiovascular;  Laterality: N/A;  . COLONOSCOPY WITH PROPOFOL N/A 10/09/2016   Procedure: COLONOSCOPY WITH PROPOFOL;  Surgeon: Daneil Dolin, MD;  [facility];  Service: Endoscopy;  Laterality: N/A;  12:00 pm  . COLONOSCOPY WITH PROPOFOL N/A 11/24/2016   Dr. Gala Romney: Multiple colon polyps removed, tubular adenoma.  Next colonoscopy in 5 years.  . CORONARY ANGIOPLASTY WITH STENT PLACEMENT  01/2015  . CORONARY STENT INTERVENTION N/A 04/27/2017   Procedure: CORONARY STENT INTERVENTION;  Surgeon: Nelva Bush, MD;  Location: [facility];  Service: Cardiovascular;  Laterality: N/A;  . ELECTROPHYSIOLOGY STUDY N/A 06/29/2017   Procedure: ELECTROPHYSIOLOGY STUDY;  Surgeon: Evans Lance, MD;  Location: [facility];  Service: Cardiovascular;  Laterality: N/A;  . ESOPHAGOGASTRODUODENOSCOPY (EGD) WITH PROPOFOL N/A 10/09/2016   Dr. Gala Romney: LA grade a esophagitis.  Barrett's esophagus, biopsy-proven.  Small hiatal hernia.  EGD February 2019  . INCISION / DRAINAGE HAND / FINGER    . LEFT HEART CATH AND CORONARY ANGIOGRAPHY N/A 04/27/2017   Procedure: LEFT HEART CATH AND CORONARY ANGIOGRAPHY;  Surgeon: Nelva Bush, MD;  Location: [facility];  Service: Cardiovascular;  Laterality: N/A;  . LOOP RECORDER INSERTION  06/29/2017   Procedure: Loop Recorder Insertion;  Surgeon: Cristopher Peru  W, MD;  Location: [facility];  Service: Cardiovascular;;  . POLYPECTOMY  11/24/2016   Procedure: POLYPECTOMY;  Surgeon: Daneil Dolin, MD;  [facility];  Service: Endoscopy;;  descending and sigmoid  . TOTAL HIP ARTHROPLASTY Right 07/01/2016  . TOTAL HIP ARTHROPLASTY Right 07/01/2016   Procedure: RIGHT TOTAL HIP ARTHROPLASTY ANTERIOR APPROACH;  Surgeon: Mcarthur Rossetti, MD;  Location: [facility];  Service: Orthopedics;  Laterality: Right;    Prior to Admission medications   Medication Sig Start Date End Date Taking? Authorizing  Provider  acetaminophen (TYLENOL) 500 MG tablet Take 1,000 mg by mouth every 4 (four) hours as needed for mild pain or fever.    Yes [provider]  albuterol (PROVENTIL) (2.5 MG/3ML) 0.083% nebulizer solution Take 3 mLs (2.5 mg total) by nebulization every 6 (six) hours as needed for wheezing or shortness of breath. 05/27/16  Yes Soyla Dryer, PA-C  amLODipine (NORVASC) 10 MG tablet TAKE 1 TABLET BY MOUTH ONCE A DAY. Patient taking differently: Take 10 mg by mouth daily.  02/14/19  Yes Nida, Marella Chimes, MD  ascorbic acid (VITAMIN C) 500 MG tablet Take 1 tablet (500 mg total) by mouth daily. Patient taking differently: Take 500 mg by mouth every evening.  10/12/19  Yes Barton Dubois, MD  aspirin 81 MG EC tablet Take 1 tablet (81 mg total) by mouth daily. Patient taking differently: Take 81 mg by mouth 2 (two) times daily.  04/29/17  Yes Reino Bellis B, NP  atorvastatin (LIPITOR) 40 MG tablet TAKE 1 TABLET BY MOUTH ONCE A DAY. Patient taking differently: Take 40 mg by mouth every evening.  02/14/19  Yes Nida, Marella Chimes, MD  azelastine (ASTELIN) 0.1 % nasal spray USE 2 SPRAYS IN EACH NOSTRIL TWICE DAILY. Patient taking differently: Place 2 sprays into both nostrils 2 (two) times daily.  09/19/19  Yes Valentina Shaggy, MD  budesonide-formoterol [facility]) 160-4.5 MCG/ACT inhaler Inhale 2 puffs into the lungs 2 (two) times daily. 07/14/18  Yes Valentina Shaggy, MD  Carboxymeth-Glycerin-Polysorb (REFRESH OPTIVE ADVANCED) 0.5-1-0.5 % SOLN Apply 1 drop to eye 2 (two) times daily as needed (dry eyes).    Yes [provider]  Cholecalciferol (VITAMIN D3) 125 MCG (5000 UT) CAPS Take 1 capsule (5,000 Units total) by mouth daily. Patient taking differently: Take 5,000 Units by mouth daily in the afternoon.  07/19/19  Yes Nida, Marella Chimes, MD  citalopram (CELEXA) 20 MG tablet Take 20 mg by mouth daily. 11/06/17  Yes [provider]  cycloSPORINE (RESTASIS)  0.05 % ophthalmic emulsion Place 1 drop into both eyes 2 (two) times daily.   Yes [provider]  ferrous sulfate 325 (65 FE) MG tablet Take 325 mg by mouth daily with breakfast.   Yes [provider]  furosemide (LASIX) 40 MG tablet Take 1 tablet (40 mg total) by mouth daily as needed for fluid. Patient taking differently: Take 40 mg by mouth daily as needed for fluid (2-3x's a week).  10/11/19  Yes Barton Dubois, MD  glipiZIDE (GLUCOTROL XL) 5 MG 24 hr tablet Take 1 tablet (5 mg total) by mouth daily with breakfast. 07/19/19  Yes Nida, Marella Chimes, MD  Magnesium 200 MG TABS Take 1 tablet (200 mg total) by mouth daily. Patient taking differently: Take 200 mg by mouth every evening.  03/06/17  Yes Lendon Colonel, NP  metFORMIN (GLUCOPHAGE) 1000 MG tablet Take 1 tablet (1,000 mg total) by mouth 2 (two) times daily with  a meal. 04/15/17  Yes Raylene Everts, MD  mometasone (ELOCON) 0.1 % cream Apply 1 application topically 2 (two) times a week.  10/21/18  Yes [provider]  nitroGLYCERIN (NITROSTAT) 0.4 MG SL tablet Place 1 tablet (0.4 mg total) under the tongue every 5 (five) minutes as needed for chest pain. 10/21/17  Yes Raylene Everts, MD  pantoprazole (PROTONIX) 40 MG tablet TAKE 1 TABLET BY MOUTH ONCE A DAY. Patient taking differently: Take 40 mg by mouth daily.  05/24/19  Yes Gill, Eric A, NP  PAZEO 0.7 % SOLN Place 1 drop into both eyes daily.  11/19/18  Yes [provider]  prednisoLONE acetate (PRED FORTE) 1 % ophthalmic suspension Place 4 drops into both eyes 2 (two) times a week.  09/17/19  Yes [provider]  PROVENTIL HFA 108 (90 Base) MCG/ACT inhaler INHALE 2 PUFFS INTO THE LUNGS EVERY 6 HOURS AS NEEDED FOR SHORTNESS OF BREATH OR WHEEZING. Patient taking differently: Inhale 2 puffs into the lungs every 6 (six) hours as needed for wheezing or shortness of breath.  08/13/17  Yes Raylene Everts, MD  SPIRIVA RESPIMAT 2.5 MCG/ACT  AERS INHALE 1 PUFF INTO THE LUNGS DAILY. Patient taking differently: Inhale 1 puff into the lungs daily.  09/19/19  Yes Valentina Shaggy, MD  dexamethasone (DECADRON) 6 MG tablet Take 1 tablet (6 mg total) by mouth daily. Patient not taking: Reported on 11/21/2019 10/11/19   Barton Dubois, MD    Allergies as of 09/01/2019 - Review Complete 09/01/2019  Allergen Reaction Noted  . Carvedilol Other (See Comments) 02/08/2015  . Lisinopril Anaphylaxis, Shortness Of Breath, and Swelling 05/30/2015  . Amoxicillin Nausea And Vomiting 02/06/2015    Family History  Problem Relation Age of Onset  . Heart attack Father   . Stroke Father   . Arthritis Father   . Heart disease Father   . Cancer Mother        ???  . Arthritis Mother   . Heart disease Brother 20       died in sleep  . Early death Brother   . Diabetes Maternal Uncle   . Alzheimer's disease Maternal Grandmother     Social History   Socioeconomic History  . Marital status: Significant Other    Spouse name: alice  . Number of children: 3  . Years of education: 82  . Highest education level: Not on file  Occupational History  . Occupation: unemployed    Comment: Nature conservation officer    Comment: disability pending  Tobacco Use  . Smoking status: Current Every Day Smoker    Packs/day: 1.50    Years: 46.00    Pack years: 69.00    Types: Cigarettes  . Smokeless tobacco: Former Systems developer    Types: Chew  Substance and Sexual Activity  . Alcohol use: Yes    Alcohol/week: 0.0 standard drinks    Comment: pint of whiskey daily  . Drug use: No    Comment: history of drug use- marijuana, cocaine- quit 2014  . Sexual activity: Yes    Partners: Female    Birth control/protection: None  Other Topics Concern  . Not on file  Social History Narrative   Lives with girlfriend [name]   Leisure - TV   Lost his son 07-03-2018 due to overdose   Social Determinants of Health   Financial Resource Strain:   . Difficulty of Paying Living  Expenses:   Food Insecurity:   . Worried About Running  Out of Food in the Last Year:   . [place] in the Last Year:   Transportation Needs:   . Lack of Transportation (Medical):   [place] Kitchen Lack of Transportation (Non-Medical):   Physical Activity:   . Days of Exercise per Week:   . Minutes of Exercise per Session:   Stress:   . Feeling of Stress :   Social Connections:   . Frequency of Communication with Friends and Family:   . Frequency of Social Gatherings with Friends and Family:   . Attends Religious Services:   . Active Member of Clubs or Organizations:   . Attends Archivist Meetings:   Marland Kitchen Marital Status:   Intimate Partner Violence:   . Fear of Current or Ex-Partner:   . Emotionally Abused:   Mar[place] Kitchen Physically Abused:   . Sexually Abused:     Review of Systems: See HPI, otherwise negative ROS  Physical Exam: Pulse (P) 92   Temp (P) 98.4 F (36.9 C) (Oral)   Resp (P) 20   SpO2 (P) 96%  General:   Alert,  Well-developed, well-nourished, pleasant and cooperative in NAD Neck:  Supple; no masses or thyromegaly. No significant cervical adenopathy. Lungs:  Clear throughout to auscultation.   No wheezes, crackles, or rhonchi. No acute distress. Heart:  Regular rate and rhythm; no murmurs, clicks, rubs,  or gallops. Abdomen: Non-distended, normal bowel sounds.  Soft and nontender without appreciable mass or hepatosplenomegaly.  Pulses:  Normal pulses noted. Extremities:  Without clubbing or edema.  Impression/Plan: 54 year old gentleman with a 3 cm segment of Barrett's esophagus.  Here for surveillance EGD.  No dysphagia.  Prior airway issues reviewed with patient.  Plan for EGD with biopsy today per plan.   The risks, benefits, limitations, alternatives and imponderables have been reviewed with the patient. Potential for esophageal dilation, biopsy, etc. have also been reviewed.  Questions have been answered. All parties agreeable.     Notice: This dictation  was prepared with Dragon dictation along with smaller phrase technology. Any transcriptional errors that result from this process are unintentional and may not be corrected upon review.

## 2019-11-28 NOTE — Anesthesia Preprocedure Evaluation (Addendum)
Anesthesia Evaluation  Patient identified by MRN, date of birth, ID band Patient awake    Reviewed: Allergy & Precautions, NPO status , Patient's Chart, lab work & pertinent test results  History of Anesthesia Complications (+) DIFFICULT AIRWAY  Airway Mallampati: III  TM Distance: >3 FB Neck ROM: Full   Comment: H/o difficult airway Dental  (+) Dental Advisory Given, Missing   Pulmonary shortness of breath, with exertion and lying, asthma , sleep apnea , pneumonia (COVID - 10/07/2019), resolved, COPD,  COPD inhaler, Current Smoker,    Room air spo2 - 94  breath sounds clear to auscultation       Cardiovascular Exercise Tolerance: Poor hypertension, Pt. on medications + CAD, + Past MI, + Cardiac Stents, +CHF and + DOE  + Valvular Problems/Murmurs  Rhythm:Regular Rate:Normal  EKG - NSR, RBBB, old Lateral infarct?   Study Conclusions   - Left ventricle: The cavity size was normal. There was mild concentric hypertrophy. Systolic function was mildly to moderately reduced. The estimated ejection fraction was in the range of 40% to 45%. Diffuse hypokinesis. Doppler parameters are consistent with abnormal left ventricular relaxation (grade 1 diastolic dysfunction). Doppler parameters are consistent with  elevated ventricular end-diastolic filling pressure.  - Ventricular septum: Septal motion showed paradox.  - Aortic valve: Trileaflet; normal thickness leaflets. There was no regurgitation.  - Mitral valve: Structurally normal valve. There was trivial  regurgitation.  - Right ventricle: The cavity size was moderately dilated. Wall thickness was normal. Systolic function was moderately reduced.  - Right atrium: The atrium was normal in size.  - Tricuspid valve: There was trivial regurgitation.  - Pulmonary arteries: Systolic pressure was within the normal range.  - Inferior vena cava: The vessel was dilated. The respirophasic  diameter changes were blunted (< 50%), consistent with elevated central venous pressure.  - Pericardium, extracardiac: There was no pericardial effusion.    Neuro/Psych  Headaches, PSYCHIATRIC DISORDERS Anxiety Depression    GI/Hepatic GERD  Medicated,  Endo/Other  diabetes, Well Controlled, Type 2, Oral Hypoglycemic Agents  Renal/GU      Musculoskeletal  (+) Arthritis ,   Abdominal   Peds  Hematology   Anesthesia Other Findings Conclusions: 2018 1. Severe single-vessel coronary artery disease with subtotal thrombotic occlusion of proximal ramus intermedius with TIMI-1 flow. 2. Mild to moderate disease involving the LAD and RCA, which are large and ectatic vessels. Previously placed diagonal stent is widely patent. 3. Moderately reduced LVEF with mid and apical anterolateral hypokinesis. 4. Mildly elevated left ventricular filling pressure. 5. Successful PCI to proximal ramus intermedius with placement of a Xience Sierra 2.75 x 15 mm drug-eluting stent (post dilated to 3.1 mm) with 0% residual stenosis and TIMI-3 flow.  Recommendations: 1. Continue tirofiban for 4 hours post procedure. 2. Dual antiplatelet therapy with aspirin and present growth for at least 12 months (patient has history of intolerance to ticagrelor). 3. Aggressive secondary prevention and medical therapy of CAD and ischemic cardiomyopathy with moderately reduced LVEF. Smoking cessation strongly encouraged.   Reproductive/Obstetrics                           Anesthesia Physical Anesthesia Plan  ASA: IV  Anesthesia Plan: General   Post-op Pain Management:    Induction: Intravenous  PONV Risk Score and Plan: 1 and TIVA  Airway Management Planned: Nasal Cannula, Natural Airway and Simple Face Mask  Additional Equipment:   Intra-op Plan:   Post-operative Plan:  Informed Consent: I have reviewed the patients History and Physical, chart, labs and discussed the procedure  including the risks, benefits and alternatives for the proposed anesthesia with the patient or authorized representative who has indicated his/her understanding and acceptance.     Dental advisory given  Plan Discussed with: CRNA  Anesthesia Plan Comments:         Anesthesia Quick Evaluation

## 2019-11-28 NOTE — Discharge Instructions (Signed)
EGD Discharge instructions Please read the instructions outlined below and refer to this sheet in the next few weeks. These discharge instructions provide you with general information on caring for yourself after you leave the hospital. Your doctor may also give you specific instructions. While your treatment has been planned according to the most current medical practices available, unavoidable complications occasionally occur. If you have any problems or questions after discharge, please call your doctor. ACTIVITY  You may resume your regular activity but move at a slower pace for the next 24 hours.   Take frequent rest periods for the next 24 hours.   Walking will help expel (get rid of) the air and reduce the bloated feeling in your abdomen.   No driving for 24 hours (because of the anesthesia (medicine) used during the test).   You may shower.   Do not sign any important legal documents or operate any machinery for 24 hours (because of the anesthesia used during the test).  NUTRITION  Drink plenty of fluids.   You may resume your normal diet.   Begin with a light meal and progress to your normal diet.   Avoid alcoholic beverages for 24 hours or as instructed by your caregiver.  MEDICATIONS  You may resume your normal medications unless your caregiver tells you otherwise.  WHAT YOU CAN EXPECT TODAY  You may experience abdominal discomfort such as a feeling of fullness or gas pains.  FOLLOW-UP  Your doctor will discuss the results of your test with you.  SEEK IMMEDIATE MEDICAL ATTENTION IF ANY OF THE FOLLOWING OCCUR:  Excessive nausea (feeling sick to your stomach) and/or vomiting.   Severe abdominal pain and distention (swelling).   Trouble swallowing.   Temperature over 101 F (37.8 C).   Rectal bleeding or vomiting of blood.    Information on GERD and Barrett's esophagus provided  Continue Protonix 40 mg daily  Further recommendations to follow pending  review of pathology report  Office visit with AB in 3 months  At patient request I called Nicki Guadalajara at [phone] and reviewed results   Monitored Anesthesia Care Anesthesia is a term that refers to techniques, procedures, and medicines that help a person stay safe and comfortable during a medical procedure. Monitored anesthesia care, or sedation, is one type of anesthesia. Your anesthesia specialist may recommend sedation if you will be having a procedure that does not require you to be unconscious, such as:  Cataract surgery.  A dental procedure.  A biopsy.  A colonoscopy. During the procedure, you may receive a medicine to help you relax (sedative). There are three levels of sedation:  Mild sedation. At this level, you may feel awake and relaxed. You will be able to follow directions.  Moderate sedation. At this level, you will be sleepy. You may not remember the procedure.  Deep sedation. At this level, you will be asleep. You will not remember the procedure. The more medicine you are given, the deeper your level of sedation will be. Depending on how you respond to the procedure, the anesthesia specialist may change your level of sedation or the type of anesthesia to fit your needs. An anesthesia specialist will monitor you closely during the procedure. Let your health care provider know about:  Any allergies you have.  All medicines you are taking, including vitamins, herbs, eye drops, creams, and over-the-counter medicines.  Any use of steroids (by mouth or as a cream).  Any problems you or family members have had with  sedatives and anesthetic medicines.  Any blood disorders you have.  Any surgeries you have had.  Any medical conditions you have, such as sleep apnea.  Whether you are pregnant or may be pregnant.  Any use of cigarettes, alcohol, or street drugs. What are the risks? Generally, this is a safe procedure. However, problems may occur,  including:  Getting too much medicine (oversedation).  Nausea.  Allergic reaction to medicines.  Trouble breathing. If this happens, a breathing tube may be used to help with breathing. It will be removed when you are awake and breathing on your own.  Heart trouble.  Lung trouble. Before the procedure Staying hydrated Follow instructions from your health care provider about hydration, which may include:  Up to 2 hours before the procedure - you may continue to drink clear liquids, such as water, clear fruit juice, black coffee, and plain tea. Eating and drinking restrictions Follow instructions from your health care provider about eating and drinking, which may include:  8 hours before the procedure - stop eating heavy meals or foods such as meat, fried foods, or fatty foods.  6 hours before the procedure - stop eating light meals or foods, such as toast or cereal.  6 hours before the procedure - stop drinking milk or drinks that contain milk.  2 hours before the procedure - stop drinking clear liquids. Medicines Ask your health care provider about:  Changing or stopping your regular medicines. This is especially important if you are taking diabetes medicines or blood thinners.  Taking medicines such as aspirin and ibuprofen. These medicines can thin your blood. Do not take these medicines before your procedure if your health care provider instructs you not to. Tests and exams  You will have a physical exam.  You may have blood tests done to show: ? How well your kidneys and liver are working. ? How well your blood can clot. General instructions  Plan to have someone take you home from the hospital or clinic.  If you will be going home right after the procedure, plan to have someone with you for 24 hours.  What happens during the procedure?  Your blood pressure, heart rate, breathing, level of pain and overall condition will be monitored.  An IV tube will be  inserted into one of your veins.  Your anesthesia specialist will give you medicines as needed to keep you comfortable during the procedure. This may mean changing the level of sedation.  The procedure will be performed. After the procedure  Your blood pressure, heart rate, breathing rate, and blood oxygen level will be monitored until the medicines you were given have worn off.  Do not drive for 24 hours if you received a sedative.  You may: ? Feel sleepy, clumsy, or nauseous. ? Feel forgetful about what happened after the procedure. ? Have a sore throat if you had a breathing tube during the procedure. ? Vomit. This information is not intended to replace advice given to you by your health care provider. Make sure you discuss any questions you have with your health care provider. Document Revised: 07/31/2017 Document Reviewed: 12/09/2015 Elsevier Patient Education  2020 [place].   Barrett's Esophagus  Barrett's esophagus occurs when the tissue that lines the esophagus changes or becomes damaged. The esophagus is the tube that carries food from the throat to the stomach. With Barrett's esophagus, the cells that line the esophagus are replaced by cells that are similar to the lining of the intestines (intestinal  metaplasia). Barrett's esophagus itself may not cause any symptoms. However, many people who have Barrett's esophagus also have gastroesophageal reflux disease (GERD), which may cause symptoms such as heartburn. Over time, a few people with this condition may develop cancer of the esophagus. Treatment may include medicines, procedures to destroy the abnormal cells, or surgery. What are the causes? The exact cause of this condition is not known. In some cases, the condition develops from damage to the lining of the esophagus caused by GERD. GERD occurs when stomach acids flow up from the stomach into the esophagus. Frequent symptoms of GERD may cause intestinal metaplasia or  cause cell changes (dysplasia). What increases the risk? You are more likely to develop this condition if you:  Have GERD.  Are male.  Are Caucasian.  Are obese.  Are older than 50.  Have a hiatal hernia. This is a condition in which part of your stomach bulges into your chest.  Smoke. What are the signs or symptoms? People with Barrett's esophagus often have no symptoms. However, many people with this condition also have GERD. Symptoms of GERD may include:  Heartburn.  Difficulty swallowing.  Dry cough. How is this diagnosed? This condition may be diagnosed based on:  Results of an upper gastrointestinal endoscopy. For this exam, a thin, flexible tube with a light and a camera on the end (endoscope) is passed down your esophagus. Your health care provider can view the inside of your esophagus during this procedure.  Results of a biopsy. For this procedure, several tissue samples are removed (biopsy) from your esophagus. They are then checked for intestinal metaplasia or dysplasia. How is this treated? Treatment for this condition may include:  Medicines (proton pump inhibitors, or PPIs) to decrease or stop GERD.  Periodic endoscopic exams to make sure that cancer is not developing.  A procedure or surgery for dysplasia. This may include: ? Removal or destruction of abnormal cells. ? Removal of part of the esophagus (esophagectomy). Follow these instructions at home: Eating and drinking  Eat more fruits and vegetables.  Avoid fatty foods.  Eat small, frequent meals instead of large meals.  Avoid foods that cause heartburn. These foods include: ? Coffee and alcoholic drinks. ? Tomatoes and foods made with tomatoes. ? Greasy or spicy foods. ? Chocolate and peppermint.  Do not drink alcohol. General instructions  Take over-the-counter and prescription medicines only as told by your health care provider.  Do not use any products that contain nicotine or  tobacco, such as cigarettes and e-cigarettes. If you need help quitting, ask your health care provider.  If you are being treated for GERD, make sure you take medicines and follow all instructions as told by your health care provider.  Keep all follow-up visits as told by your health care provider. This is important. Contact a health care provider if:  You have heartburn or GERD symptoms.  You have difficulty swallowing. Get help right away if:  You have chest pain.  You are unable to swallow.  You vomit blood or material that looks like coffee grounds.  Your stool (feces) is bright red or dark. Summary  Barrett's esophagus occurs when the tissue that lines the esophagus changes or becomes damaged.  Barrett's esophagus may be diagnosed with an upper gastrointestinal endoscopy and a biopsy.  Treatment may include medicines, procedures to remove abnormal cells, or surgery.  Follow your health care provider's instructions about what to eat and drink, what medicines to take, and when to call  for help. This information is not intended to replace advice given to you by your health care provider. Make sure you discuss any questions you have with your health care provider. Document Revised: 12/14/2017 Document Reviewed: 12/14/2017 Elsevier Patient Education  [place].   Gastroesophageal Reflux Disease, Adult Gastroesophageal reflux (GER) happens when acid from the stomach flows up into the tube that connects the mouth and the stomach (esophagus). Normally, food travels down the esophagus and stays in the stomach to be digested. With GER, food and stomach acid sometimes move back up into the esophagus. You may have a disease called gastroesophageal reflux disease (GERD) if the reflux:  Happens often.  Causes frequent or very bad symptoms.  Causes problems such as damage to the esophagus. When this happens, the esophagus becomes sore and swollen (inflamed). Over time, GERD  can make small holes (ulcers) in the lining of the esophagus. What are the causes? This condition is caused by a problem with the muscle between the esophagus and the stomach. When this muscle is weak or not normal, it does not close properly to keep food and acid from coming back up from the stomach. The muscle can be weak because of:  Tobacco use.  Pregnancy.  Having a certain type of hernia (hiatal hernia).  Alcohol use.  Certain foods and drinks, such as coffee, chocolate, onions, and peppermint. What increases the risk? You are more likely to develop this condition if you:  Are overweight.  Have a disease that affects your connective tissue.  Use NSAID medicines. What are the signs or symptoms? Symptoms of this condition include:  Heartburn.  Difficult or painful swallowing.  The feeling of having a lump in the throat.  A bitter taste in the mouth.  Bad breath.  Having a lot of saliva.  Having an upset or bloated stomach.  Belching.  Chest pain. Different conditions can cause chest pain. Make sure you see your doctor if you have chest pain.  Shortness of breath or noisy breathing (wheezing).  Ongoing (chronic) cough or a cough at night.  Wearing away of the surface of teeth (tooth enamel).  Weight loss. How is this treated? Treatment will depend on how bad your symptoms are. Your doctor may suggest:  Changes to your diet.  Medicine.  Surgery. Follow these instructions at home: Eating and drinking   Follow a diet as told by your doctor. You may need to avoid foods and drinks such as: ? Coffee and tea (with or without caffeine). ? Drinks that contain alcohol. ? Energy drinks and sports drinks. ? Bubbly (carbonated) drinks or sodas. ? Chocolate and cocoa. ? Peppermint and mint flavorings. ? Garlic and onions. ? Horseradish. ? Spicy and acidic foods. These include peppers, chili powder, curry powder, vinegar, hot sauces, and BBQ sauce. ? Citrus  fruit juices and citrus fruits, such as oranges, lemons, and limes. ? Tomato-based foods. These include red sauce, chili, salsa, and pizza with red sauce. ? Fried and fatty foods. These include donuts, french fries, potato chips, and high-fat dressings. ? High-fat meats. These include hot dogs, rib eye steak, sausage, ham, and bacon. ? High-fat dairy items, such as whole milk, butter, and cream cheese.  Eat small meals often. Avoid eating large meals.  Avoid drinking large amounts of liquid with your meals.  Avoid eating meals during the 2-3 hours before bedtime.  Avoid lying down right after you eat.  Do not exercise right after you eat. Lifestyle   Do  not use any products that contain nicotine or tobacco. These include cigarettes, e-cigarettes, and chewing tobacco. If you need help quitting, ask your doctor.  Try to lower your stress. If you need help doing this, ask your doctor.  If you are overweight, lose an amount of weight that is healthy for you. Ask your doctor about a safe weight loss goal. General instructions  Pay attention to any changes in your symptoms.  Take over-the-counter and prescription medicines only as told by your doctor. Do not take aspirin, ibuprofen, or other NSAIDs unless your doctor says it is okay.  Wear loose clothes. Do not wear anything tight around your waist.  Raise (elevate) the head of your bed about 6 inches (15 cm).  Avoid bending over if this makes your symptoms worse.  Keep all follow-up visits as told by your doctor. This is important. Contact a doctor if:  You have new symptoms.  You lose weight and you do not know why.  You have trouble swallowing or it hurts to swallow.  You have wheezing or a cough that keeps happening.  Your symptoms do not get better with treatment.  You have a hoarse voice. Get help right away if:  You have pain in your arms, neck, jaw, teeth, or back.  You feel sweaty, dizzy, or  light-headed.  You have chest pain or shortness of breath.  You throw up (vomit) and your throw-up looks like blood or coffee grounds.  You pass out (faint).  Your poop (stool) is bloody or black.  You cannot swallow, drink, or eat. Summary  If a person has gastroesophageal reflux disease (GERD), food and stomach acid move back up into the esophagus and cause symptoms or problems such as damage to the esophagus.  Treatment will depend on how bad your symptoms are.  Follow a diet as told by your doctor.  Take all medicines only as told by your doctor. This information is not intended to replace advice given to you by your health care provider. Make sure you discuss any questions you have with your health care provider. Document Revised: 02/24/2018 Document Reviewed: 02/24/2018 Elsevier Patient Education  [place].

## 2019-11-29 LAB — SURGICAL PATHOLOGY

## 2019-12-01 ENCOUNTER — Encounter: Payer: Self-pay | Admitting: Internal Medicine

## 2019-12-04 ENCOUNTER — Other Ambulatory Visit: Payer: Self-pay | Admitting: "Endocrinology

## 2019-12-04 ENCOUNTER — Other Ambulatory Visit: Payer: Self-pay | Admitting: Nurse Practitioner

## 2019-12-04 LAB — CUP PACEART REMOTE DEVICE CHECK
Date Time Interrogation Session: 20210404000500
Implantable Pulse Generator Implant Date: 20181029

## 2019-12-05 ENCOUNTER — Ambulatory Visit (INDEPENDENT_AMBULATORY_CARE_PROVIDER_SITE_OTHER): Payer: Medicare Other | Admitting: *Deleted

## 2019-12-05 DIAGNOSIS — R55 Syncope and collapse: Secondary | ICD-10-CM

## 2019-12-07 ENCOUNTER — Ambulatory Visit (INDEPENDENT_AMBULATORY_CARE_PROVIDER_SITE_OTHER): Payer: Medicare Other

## 2019-12-07 DIAGNOSIS — K219 Gastro-esophageal reflux disease without esophagitis: Secondary | ICD-10-CM | POA: Diagnosis not present

## 2019-12-07 DIAGNOSIS — Z1389 Encounter for screening for other disorder: Secondary | ICD-10-CM | POA: Diagnosis not present

## 2019-12-07 DIAGNOSIS — Z0001 Encounter for general adult medical examination with abnormal findings: Secondary | ICD-10-CM | POA: Diagnosis not present

## 2019-12-07 DIAGNOSIS — J309 Allergic rhinitis, unspecified: Secondary | ICD-10-CM

## 2019-12-07 DIAGNOSIS — E785 Hyperlipidemia, unspecified: Secondary | ICD-10-CM | POA: Diagnosis not present

## 2019-12-07 DIAGNOSIS — I10 Essential (primary) hypertension: Secondary | ICD-10-CM | POA: Diagnosis not present

## 2019-12-07 NOTE — Progress Notes (Signed)
ILR Remote 

## 2019-12-08 ENCOUNTER — Encounter: Payer: Self-pay | Admitting: Family Medicine

## 2019-12-08 ENCOUNTER — Other Ambulatory Visit: Payer: Self-pay

## 2019-12-08 ENCOUNTER — Ambulatory Visit (INDEPENDENT_AMBULATORY_CARE_PROVIDER_SITE_OTHER): Payer: Medicare Other | Admitting: Family Medicine

## 2019-12-08 VITALS — BP 144/86 | HR 104 | Temp 97.0°F | Resp 15 | Ht 65.0 in | Wt 201.0 lb

## 2019-12-08 DIAGNOSIS — E6609 Other obesity due to excess calories: Secondary | ICD-10-CM

## 2019-12-08 DIAGNOSIS — I5042 Chronic combined systolic (congestive) and diastolic (congestive) heart failure: Secondary | ICD-10-CM | POA: Diagnosis not present

## 2019-12-08 DIAGNOSIS — Z6834 Body mass index (BMI) 34.0-34.9, adult: Secondary | ICD-10-CM

## 2019-12-08 DIAGNOSIS — G4730 Sleep apnea, unspecified: Secondary | ICD-10-CM | POA: Diagnosis not present

## 2019-12-08 DIAGNOSIS — K219 Gastro-esophageal reflux disease without esophagitis: Secondary | ICD-10-CM | POA: Diagnosis not present

## 2019-12-08 DIAGNOSIS — E1159 Type 2 diabetes mellitus with other circulatory complications: Secondary | ICD-10-CM | POA: Diagnosis not present

## 2019-12-08 DIAGNOSIS — I10 Essential (primary) hypertension: Secondary | ICD-10-CM

## 2019-12-08 NOTE — Assessment & Plan Note (Addendum)
Reports using CPAP. Followed by Pulm with Dr Luan Pulling will need referral to new pulm in future for PFT.

## 2019-12-08 NOTE — Assessment & Plan Note (Signed)
Followed by Dr. Dorris Fetch, continue current treatment plan based off of Dr. Liliane Channel appointments and visits.

## 2019-12-08 NOTE — Assessment & Plan Note (Signed)
  Randy Hebert is re-educated about the importance of exercise daily to help with weight management. A minumum of 30 minutes daily is recommended. Additionally, importance of healthy food choices  with portion control discussed.   Wt Readings from Last 3 Encounters:  12/08/19 201 lb (91.2 kg)  11/23/19 200 lb (90.7 kg)  11/16/19 204 lb 3.2 oz (92.6 kg)

## 2019-12-08 NOTE — Assessment & Plan Note (Signed)
Not well controlled, continue current medications at this time.  Is followed by cardiology who does adjustment of medications.  Possible need for future adjustment.  Encouraged DASH diet and exercise as tolerated.

## 2019-12-08 NOTE — Patient Instructions (Addendum)
I appreciate the opportunity to provide you with care for your health and wellness. Today we discussed: establish care  Follow up: 6 months for annual appt   No labs or referrals today  Happy Spring!  Please continue to practice social distancing to keep you, your family, and our community safe.  If you must go out, please wear a mask and practice good handwashing.  It was a pleasure to see you and I look forward to continuing to work together on your health and well-being. Please do not hesitate to call the office if you need care or have questions about your care.  Have a wonderful day and week. With Gratitude, Cherly Beach, DNP, AGNP-BC

## 2019-12-08 NOTE — Progress Notes (Signed)
Subjective:  Patient ID: Randy Hebert, male    DOB: [birth year]  Age: 54 y.o. MRN: [id number]  CC:  Chief Complaint  Patient presents with  . Establish Care      HPI  HPI Randy Hebert is a 54 year old male patient who presents today to reestablish care here in the [place] [facility] practice was previously seen by Dr. Meda Coffee in 2019.  Moved to Dr. Legrand Rams once Dr. Meda Coffee left the practice.  But [facility].   He denies having any issues or concerns today in the office.  Reports that he needs to get his diabetic eye exam. He is followed by Dr. Dorris Fetch for diabetes, allergy and asthma for COPD and allergies, cardiology for previous CAD and multiple MIs.  GI for Barrett's esophagus.  Reports taking all his medications without any issues or concerns.  Has no side effects at this time.  Today patient denies signs and symptoms of COVID 19 infection including fever, chills, cough, shortness of breath, and headache. Past Medical, Surgical, Social History, Allergies, and Medications have been Reviewed.   Past Medical History:  Diagnosis Date  . ACE inhibitor-aggravated angioedema   . Allergy   . Angio-edema   . Anxiety   . Arthritis   . Asthma    hip replacement  . Back pain   . Bradycardia 04/28/2017  . Bulging of cervical intervertebral disc   . CAD (coronary artery disease)    lateral STEMI 02/06/2015 00% D1 occlusion treated with Promus Premier 2.5 mm x 16 mm DES, 70% ramus stenosis, 40% mid RCA stenosis, 45% distal RCA stenosis, EF 45-50%  . CHF (congestive heart failure) ([place])   . COPD (chronic obstructive pulmonary disease) ([place])   . Depression   . Diabetes mellitus without complication ([place])   . 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 Dry eye   . Dyspnea   . Early satiety 09/23/2016  . GERD (gastroesophageal reflux disease)   . HCAP (healthcare-associated pneumonia) 05/15/2017  . Headache   . Heart  murmur   . Hip pain   . Hyperlipidemia   . Hypertension   . Lobar pneumonia ([place]) 05/15/2017  . Melena 08/04/2018  . MI (myocardial infarction) ([place])   . Myocardial infarction ([place])   . Neck pain   . Non-ST elevation (NSTEMI) myocardial infarction ([place]) 04/27/2017  . NSTEMI (non-ST elevated myocardial infarction) ([place]) 04/26/2017  . Otitis media   . Pleurisy   . Pneumonia due to COVID-19 virus 10/07/2019  . Rectal bleeding 11/08/2018  . Sinus pause    9 sec sinus pause on telemetry after started on coreg after MI, avoid AV nodal blocking agent  . Sleep apnea   . 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 STEMI (ST elevation myocardial infarction) ([place]) 05/31/2015  . Substance abuse ([place]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  . Syncope 06/29/2017  . Syncope and collapse 05/15/2017  . Transaminitis 08/04/2018  . Unilateral primary osteoarthritis, right hip 07/01/2016    Current Meds  Medication Sig  . acetaminophen (TYLENOL) 500 MG tablet Take 1,000 mg by mouth every 4 (four) hours as needed for mild pain or fever.   Marland Kitchen albuterol (PROVENTIL) (2.5 MG/3ML) 0.083% nebulizer solution Take 3 mLs (2.5 mg total) by nebulization every 6 (six) hours as needed for wheezing or shortness of breath.  Marland Kitchen amLODipine (NORVASC) 10 MG tablet TAKE 1 TABLET BY MOUTH  ONCE A DAY. (Patient taking differently: Take 10 mg by mouth daily. )  . ascorbic acid (VITAMIN C) 500 MG tablet Take 1 tablet (500 mg total) by mouth daily. (Patient taking differently: Take 500 mg by mouth every evening. )  . aspirin 81 MG EC tablet Take 1 tablet (81 mg total) by mouth daily. (Patient taking differently: Take 81 mg by mouth 2 (two) times daily. )  . atorvastatin (LIPITOR) 40 MG tablet TAKE 1 TABLET BY MOUTH ONCE A DAY. (Patient taking differently: Take 40 mg by mouth every evening. )  . azelastine (ASTELIN) 0.1 % nasal spray USE 2 SPRAYS IN EACH NOSTRIL TWICE DAILY. (Patient taking differently: Place 2 sprays into both nostrils 2 (two) times  daily. )  . budesonide-formoterol (SYMBICORT) 160-4.5 MCG/ACT inhaler Inhale 2 puffs into the lungs 2 (two) times daily.  . Carboxymeth-Glycerin-Polysorb (REFRESH OPTIVE ADVANCED) 0.5-1-0.5 % SOLN Apply 1 drop to eye 2 (two) times daily as needed (dry eyes).   . Cholecalciferol (VITAMIN D3) 125 MCG (5000 UT) CAPS Take 1 capsule (5,000 Units total) by mouth daily. (Patient taking differently: Take 5,000 Units by mouth daily in the afternoon. )  . citalopram (CELEXA) 20 MG tablet Take 20 mg by mouth daily.  . cycloSPORINE (RESTASIS) 0.05 % ophthalmic emulsion Place 1 drop into both eyes 2 (two) times daily.  . ferrous sulfate 325 (65 FE) MG tablet Take 325 mg by mouth daily with breakfast.  . furosemide (LASIX) 40 MG tablet Take 1 tablet (40 mg total) by mouth daily as needed for fluid. (Patient taking differently: Take 40 mg by mouth daily as needed for fluid (2-3x's a week). )  . Magnesium 200 MG TABS Take 1 tablet (200 mg total) by mouth daily. (Patient taking differently: Take 200 mg by mouth every evening. )  . metFORMIN (GLUCOPHAGE) 1000 MG tablet Take 1 tablet (1,000 mg total) by mouth 2 (two) times daily with a meal.  . mometasone (ELOCON) 0.1 % cream Apply 1 application topically 2 (two) times a week.   . nitroGLYCERIN (NITROSTAT) 0.4 MG SL tablet Place 1 tablet (0.4 mg total) under the tongue every 5 (five) minutes as needed for chest pain.  . pantoprazole (PROTONIX) 40 MG tablet TAKE 1 TABLET BY MOUTH ONCE A DAY.  Marland Kitchen PAZEO 0.7 % SOLN Place 1 drop into both eyes daily.   . prednisoLONE acetate (PRED FORTE) 1 % ophthalmic suspension Place 4 drops into both eyes 2 (two) times a week.   Marland Kitchen PROVENTIL HFA 108 (90 Base) MCG/ACT inhaler INHALE 2 PUFFS INTO THE LUNGS EVERY 6 HOURS AS NEEDED FOR SHORTNESS OF BREATH OR WHEEZING. (Patient taking differently: Inhale 2 puffs into the lungs every 6 (six) hours as needed for wheezing or shortness of breath. )  . SPIRIVA RESPIMAT 2.5 MCG/ACT AERS INHALE 1 PUFF  INTO THE LUNGS DAILY. (Patient taking differently: Inhale 1 puff into the lungs daily. )    ROS:  Review of Systems  Constitutional: Negative.   HENT: Negative.   Eyes: Negative.   Respiratory: Negative.   Cardiovascular: Negative.   Gastrointestinal: Negative.   Genitourinary: Negative.   Musculoskeletal: Negative.   Skin: Negative.   Neurological: Negative.   Endo/Heme/Allergies: Negative.   Psychiatric/Behavioral: Negative.   All other systems reviewed and are negative.    Objective:   Today's Vitals: BP (!) 144/86   Pulse (!) 104   Temp (!) 97 F (36.1 C) (Temporal)   Resp 15   Ht 5\' 5"  (1.651  m)   Wt 201 lb (91.2 kg)   SpO2 95%   BMI 33.45 kg/m  Vitals with BMI 12/08/2019 11/28/2019 11/28/2019  Height 5\' 5"  - -  Weight 201 lbs - -  BMI 53.97 - -  Systolic 673 419 379  Diastolic 86 91 86  Pulse 024 90 86     Physical Exam Vitals and nursing note reviewed.  Constitutional:      Appearance: Normal appearance. He is well-developed and well-groomed. He is obese.  HENT:     Head: Normocephalic and atraumatic.     Right Ear: External ear normal.     Left Ear: External ear normal.     Mouth/Throat:     Comments: Mask in place Eyes:     General:        Right eye: No discharge.        Left eye: No discharge.     Conjunctiva/sclera: Conjunctivae normal.  Cardiovascular:     Rate and Rhythm: Normal rate and regular rhythm.     Pulses: Normal pulses.     Heart sounds: Normal heart sounds.  Pulmonary:     Effort: Pulmonary effort is normal.     Breath sounds: Normal breath sounds.  Musculoskeletal:        General: Normal range of motion.     Cervical back: Normal range of motion and neck supple.  Skin:    General: Skin is warm.  Neurological:     General: No focal deficit present.     Mental Status: He is alert and oriented to person, place, and time.  Psychiatric:        Attention and Perception: Attention normal.        Mood and Affect: Mood normal.          Speech: Speech normal.        Behavior: Behavior normal. Behavior is cooperative.        Thought Content: Thought content normal.        Cognition and Memory: Cognition normal.        Judgment: Judgment normal.      Assessment   1. DM type 2 causing vascular disease ([place])   2. Chronic combined systolic and diastolic CHF (congestive heart failure) ([place])   3. Gastroesophageal reflux disease, unspecified whether esophagitis present   4. Essential hypertension, benign   5. Sleep apnea, unspecified type   6. Class 1 obesity due to excess calories without serious comorbidity with body mass index (BMI) of 34.0 to 34.9 in adult     Tests ordered No orders of the defined types were placed in this encounter.    Plan: Please see assessment and plan per problem list above.   No orders of the defined types were placed in this encounter.   Patient to follow-up in 3-6 months  Perlie Mayo, NP

## 2019-12-08 NOTE — Assessment & Plan Note (Signed)
Controlled, followed by GI

## 2019-12-16 ENCOUNTER — Ambulatory Visit (INDEPENDENT_AMBULATORY_CARE_PROVIDER_SITE_OTHER): Payer: Medicare Other

## 2019-12-16 DIAGNOSIS — J309 Allergic rhinitis, unspecified: Secondary | ICD-10-CM

## 2019-12-23 ENCOUNTER — Telehealth: Payer: Self-pay | Admitting: Student

## 2019-12-23 ENCOUNTER — Ambulatory Visit (INDEPENDENT_AMBULATORY_CARE_PROVIDER_SITE_OTHER): Payer: Medicare Other

## 2019-12-23 DIAGNOSIS — J309 Allergic rhinitis, unspecified: Secondary | ICD-10-CM

## 2019-12-23 NOTE — Telephone Encounter (Signed)
Carelink alert for pause (nocturnal, known) and possible missing episode  Pt denies syncope, dizziness, or lightheadedness. Not home right now, but will send a manual transmission in several hours to be reviewed.   Randy Hebert [address]" [place], PA-C  12/23/2019 10:14 AM

## 2019-12-26 NOTE — Telephone Encounter (Signed)
The pt is being sent a new handheld. It should arrive in 7-10 business days.

## 2019-12-27 DIAGNOSIS — R569 Unspecified convulsions: Secondary | ICD-10-CM | POA: Diagnosis not present

## 2019-12-27 DIAGNOSIS — G5600 Carpal tunnel syndrome, unspecified upper limb: Secondary | ICD-10-CM | POA: Diagnosis not present

## 2019-12-27 DIAGNOSIS — I10 Essential (primary) hypertension: Secondary | ICD-10-CM | POA: Diagnosis not present

## 2019-12-27 DIAGNOSIS — G4733 Obstructive sleep apnea (adult) (pediatric): Secondary | ICD-10-CM | POA: Diagnosis not present

## 2020-01-02 ENCOUNTER — Telehealth: Payer: Self-pay | Admitting: Emergency Medicine

## 2020-01-02 NOTE — Telephone Encounter (Addendum)
Patient needs to send manual transmission to view pause episodes recorded by [facility]. Still waiting for arrival of new equipment from [employer] to replace old hand held piece. Should arrive some time this week.

## 2020-01-03 NOTE — Telephone Encounter (Signed)
LMOVM for pt to call the office tomorrow.

## 2020-01-04 ENCOUNTER — Ambulatory Visit (INDEPENDENT_AMBULATORY_CARE_PROVIDER_SITE_OTHER): Payer: Medicare Other

## 2020-01-04 DIAGNOSIS — J309 Allergic rhinitis, unspecified: Secondary | ICD-10-CM | POA: Diagnosis not present

## 2020-01-04 NOTE — Telephone Encounter (Signed)
Patient called this morning and as of now he still has not received his hand held and he says that he will call as soon as he receives it

## 2020-01-05 LAB — CUP PACEART REMOTE DEVICE CHECK
Date Time Interrogation Session: 20210502002758
Implantable Pulse Generator Implant Date: 20181029

## 2020-01-09 ENCOUNTER — Other Ambulatory Visit: Payer: Self-pay | Admitting: Allergy & Immunology

## 2020-01-09 ENCOUNTER — Ambulatory Visit (INDEPENDENT_AMBULATORY_CARE_PROVIDER_SITE_OTHER): Payer: Medicare Other | Admitting: *Deleted

## 2020-01-09 DIAGNOSIS — I5042 Chronic combined systolic (congestive) and diastolic (congestive) heart failure: Secondary | ICD-10-CM

## 2020-01-09 NOTE — Progress Notes (Signed)
[facility] Summary Report / Loop Recorder 

## 2020-01-12 ENCOUNTER — Telehealth: Payer: Self-pay

## 2020-01-12 NOTE — Telephone Encounter (Signed)
Carelink alert received for pause detected.  Details were not available.  Spoke with pt for manual transmission.  Results reflect multiple pauses occurring ranging from 3-6 seconds.  All are during nocturnal hours.  Pt typically lays down around 2000 each night and reports aside from getting up for his dog sleeps until morning.  In discussion it turns out pt stopped using his CPAP machine during sleep around the end of march.  He stopped because he had pneumonia and was concerned that it was COVID.  He didn't want to use the machine until it had been throughly cleaned.  He estimates he resumed using the machine sometime after 5/9.  It appears the increase in nocturnal episodes started around 4/2 and he has not had any since 5/9.  Pt v/u of compliance with CPAP machine.

## 2020-01-16 NOTE — Telephone Encounter (Signed)
We are not concerned about asymptomatic/nocturnal pauses. GT

## 2020-01-18 ENCOUNTER — Ambulatory Visit (INDEPENDENT_AMBULATORY_CARE_PROVIDER_SITE_OTHER): Payer: Medicare Other

## 2020-01-18 DIAGNOSIS — J309 Allergic rhinitis, unspecified: Secondary | ICD-10-CM

## 2020-01-18 NOTE — Telephone Encounter (Signed)
Reviewed manual transmission. All appear nocturnal.

## 2020-01-18 NOTE — Telephone Encounter (Signed)
Called patient regarding receiving his hand held monitor, patient states he has received it and he will send manual transmission within the next 5 minutes.

## 2020-01-25 ENCOUNTER — Ambulatory Visit (INDEPENDENT_AMBULATORY_CARE_PROVIDER_SITE_OTHER): Payer: Medicare Other

## 2020-01-25 DIAGNOSIS — J309 Allergic rhinitis, unspecified: Secondary | ICD-10-CM | POA: Diagnosis not present

## 2020-02-13 ENCOUNTER — Ambulatory Visit (INDEPENDENT_AMBULATORY_CARE_PROVIDER_SITE_OTHER): Payer: Medicare Other | Admitting: *Deleted

## 2020-02-13 DIAGNOSIS — R55 Syncope and collapse: Secondary | ICD-10-CM | POA: Diagnosis not present

## 2020-02-13 LAB — CUP PACEART REMOTE DEVICE CHECK
Date Time Interrogation Session: 20210613234104
Implantable Pulse Generator Implant Date: 20181029

## 2020-02-14 NOTE — Progress Notes (Signed)
[facility] Summary Report / Loop Recorder 

## 2020-02-15 ENCOUNTER — Ambulatory Visit (INDEPENDENT_AMBULATORY_CARE_PROVIDER_SITE_OTHER): Payer: Medicare Other

## 2020-02-15 DIAGNOSIS — J309 Allergic rhinitis, unspecified: Secondary | ICD-10-CM

## 2020-02-16 DIAGNOSIS — E119 Type 2 diabetes mellitus without complications: Secondary | ICD-10-CM | POA: Diagnosis not present

## 2020-02-16 LAB — HM DIABETES EYE EXAM

## 2020-02-20 ENCOUNTER — Other Ambulatory Visit: Payer: Self-pay

## 2020-02-20 ENCOUNTER — Ambulatory Visit (INDEPENDENT_AMBULATORY_CARE_PROVIDER_SITE_OTHER): Payer: Medicare Other | Admitting: "Endocrinology

## 2020-02-20 ENCOUNTER — Encounter: Payer: Self-pay | Admitting: Nutrition

## 2020-02-20 ENCOUNTER — Encounter: Payer: Medicare Other | Attending: "Endocrinology | Admitting: Nutrition

## 2020-02-20 ENCOUNTER — Encounter: Payer: Self-pay | Admitting: "Endocrinology

## 2020-02-20 VITALS — Wt 206.0 lb

## 2020-02-20 VITALS — BP 142/80 | HR 89 | Ht 65.0 in | Wt 206.6 lb

## 2020-02-20 DIAGNOSIS — E782 Mixed hyperlipidemia: Secondary | ICD-10-CM | POA: Diagnosis not present

## 2020-02-20 DIAGNOSIS — E669 Obesity, unspecified: Secondary | ICD-10-CM | POA: Diagnosis present

## 2020-02-20 DIAGNOSIS — F17200 Nicotine dependence, unspecified, uncomplicated: Secondary | ICD-10-CM

## 2020-02-20 DIAGNOSIS — E1159 Type 2 diabetes mellitus with other circulatory complications: Secondary | ICD-10-CM

## 2020-02-20 DIAGNOSIS — I10 Essential (primary) hypertension: Secondary | ICD-10-CM

## 2020-02-20 DIAGNOSIS — K760 Fatty (change of) liver, not elsewhere classified: Secondary | ICD-10-CM | POA: Diagnosis not present

## 2020-02-20 LAB — POCT GLYCOSYLATED HEMOGLOBIN (HGB A1C): Hemoglobin A1C: 6.5 % — AB (ref 4.0–5.6)

## 2020-02-20 NOTE — Progress Notes (Signed)
Medical Nutrition Therapy:  Appt start time: 1400 end time:  1430 Assessment:  Primary concerns today: Diabetes Type 2.  A1C 6.5%, down from 6.8%. . Saw Dr. Dorris Fetch today. Wt up 6 lbs. Eating more fresh fruits and vegetables. Drinking water Has a garden. Doing yard work. Puts salt on watermelon. Admits to eating too much watermelon. Working on cutting back on smoking. Smokes 1 every 4 hours now. Notes his breathing is much better. Feels better. Cutting out processed foods and cooking foods more at home. Metformin 1000 mg BID and Glipizide. He notes he doesn't have an appetite at times.   Lab Results  Component Value Date   HGBA1C 6.5 (A) 02/20/2020   Lipid Panel     Component Value Date/Time   CHOL 154 11/09/2019 0714   TRIG 319 (H) 11/09/2019 0714   HDL 30 (L) 11/09/2019 0714   CHOLHDL 5.1 (H) 11/09/2019 0714   VLDL 41 (H) 04/27/2017 0505   LDLCALC 86 11/09/2019 0714   CMP Latest Ref Rng & Units 10/11/2019 10/10/2019 10/09/2019  Glucose 70 - 99 mg/dL 137(H) 116(H) 143(H)  BUN 6 - 20 mg/dL 14 12 13   Creatinine 0.61 - 1.24 mg/dL 0.66 0.76 0.59(L)  Sodium 135 - 145 mmol/L 138 139 137  Potassium 3.5 - 5.1 mmol/L 4.3 4.2 4.0  Chloride 98 - 111 mmol/L 102 100 99  CO2 22 - 32 mmol/L 28 29 26   Calcium 8.9 - 10.3 mg/dL 9.3 9.2 9.3  Total Protein 6.5 - 8.1 g/dL 7.2 7.3 7.3  Total Bilirubin 0.3 - 1.2 mg/dL 0.8 0.8 0.9  Alkaline Phos 38 - 126 U/L 99 103 104  AST 15 - 41 U/L 91(H) 73(H) 39  ALT 0 - 44 U/L 80(H) 53(H) 36   Wt Readings from Last 3 Encounters:  02/20/20 206 lb (93.4 kg)  02/20/20 206 lb 9.6 oz (93.7 kg)  12/08/19 201 lb (91.2 kg)   Ht Readings from Last 3 Encounters:  02/20/20 5\' 5"  (1.651 m)  12/08/19 5\' 5"  (1.651 m)  11/23/19 5\' 5"  (1.651 m)   Body mass index is 34.28 kg/m. @BMIFA @ Facility age limit for growth percentiles is 20 years. Facility age limit for growth percentiles is 20 years.   Preferred Learning Style:   No preference indicated   Learning  Readiness:   Ready  Change in progress   MEDICATIONS: See chart   B Boiled egg, toast 1 slice, oatmeal and milk. L) Cream of chicken soup, crackers 10, unsweet tea D ( PM):  [place] sandwichSnk ( PM):  Beverages:Usual physical activity: walking some a few times per week.  Estimated energy needs: 1800  calories 200 g carbohydrates 135 g protein 50 g fat  Progress Towards Goal(s):  In progress.   Nutritional Diagnosis:  NB-1.1 Food and nutrition-related knowledge deficit As related to Diabetes.  As evidenced by A1C 7.6%.    Intervention: Nutrition and Diabetes education provided on My Plate, CHO counting, meal planning, portion sizes, timing of meals, avoiding snacks between meals unless having a low blood sugar, target ranges for A1C and blood sugars, signs/symptoms and treatment of hyper/hypoglycemia, monitoring blood sugars, taking medications as prescribed, benefits of exercising 30 minutes per day and prevention of complications of DM.  Goals  Quit smoking by August 1st. Measure foods out-cantalope and watermelon especially.. Stop using salt with watermelon. Walk 30 minutes a day Get A1C 6.3% or less Lose 3 lbs per month, Don't put salt on watermelon or other foods.  Teaching Method Utilized:  Visual Auditory Hands on  Handouts given during visit include:  The Plate Method  Meal Plan Card  Diabetes Instructions.   Barriers to learning/adherence to lifestyle change: none  Demonstrated degree of understanding via:  Teach Back   Monitoring/Evaluation:  Dietary intake, exercise, meal planning, SBG, and body weight in 3 month(s).

## 2020-02-20 NOTE — Patient Instructions (Signed)

## 2020-02-20 NOTE — Progress Notes (Signed)
02/20/2020, 5:26 PM      Endocrinology follow-up note    Subjective:    Patient ID: Randy Hebert, male    DOB: 02/11/1966.  Randy Hebert is being seen in  follow-up for management of currently uncontrolled symptomatic type 2 diabetes, hyperlipidemia, hypertension. PMD:  Perlie Mayo, NP.   Past Medical History:  Diagnosis Date  . ACE inhibitor-aggravated angioedema   . Allergy   . Angio-edema   . Anxiety   . Arthritis   . Asthma    hip replacement  . Back pain   . Bradycardia 04/28/2017  . Bulging of cervical intervertebral disc   . CAD (coronary artery disease)    lateral STEMI 02/06/2015 00% D1 occlusion treated with Promus Premier 2.5 mm x 16 mm DES, 70% ramus stenosis, 40% mid RCA stenosis, 45% distal RCA stenosis, EF 45-50%  . CHF (congestive heart failure) ([place])   . COPD (chronic obstructive pulmonary disease) ([place])   . Depression   . Diabetes mellitus without complication ([place])   . 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 Dry eye   . Dyspnea   . Early satiety 09/23/2016  . GERD (gastroesophageal reflux disease)   . HCAP (healthcare-associated pneumonia) 05/15/2017  . Headache   . Heart murmur   . Hip pain   . Hyperlipidemia   . Hypertension   . Lobar pneumonia ([place]) 05/15/2017  . Melena 08/04/2018  . MI (myocardial infarction) ([place])   . Myocardial infarction ([place])   . Neck pain   . Non-ST elevation (NSTEMI) myocardial infarction ([place]) 04/27/2017  . NSTEMI (non-ST elevated myocardial infarction) ([place]) 04/26/2017  . Otitis media   . Pleurisy   . Pneumonia due to COVID-19 virus 10/07/2019  . Rectal bleeding 11/08/2018  . Sinus pause    9 sec sinus pause on telemetry after started on coreg after MI, avoid AV nodal blocking agent  . Sleep apnea   . 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 STEMI (ST elevation myocardial infarction) ([place]) 05/31/2015  . Substance abuse ([place])     alcoholic  . Syncope 06/29/2017  . Syncope and collapse 05/15/2017  . Transaminitis 08/04/2018  . Unilateral primary osteoarthritis, right hip 07/01/2016   Past Surgical History:  Procedure Laterality Date  . BIOPSY  10/09/2016   Procedure: BIOPSY;  Surgeon: Daneil Dolin, MD;  [facility];  Service: Endoscopy;;  . BIOPSY  11/28/2019   Procedure: BIOPSY;  Surgeon: Daneil Dolin, MD;  [facility];  Service: Endoscopy;;  . CARDIAC CATHETERIZATION N/A 02/06/2015   Procedure: Left Heart Cath and Coronary Angiography;  Surgeon: Leonie Man, MD;  Location: [facility];  Service: Cardiovascular;  Laterality: N/A;  . CARDIAC CATHETERIZATION N/A 02/06/2015   Procedure: Coronary Stent Intervention;  Surgeon: Leonie Man, MD;  Location: [facility];  Service: Cardiovascular;  Laterality: N/A;  . COLONOSCOPY WITH PROPOFOL N/A 10/09/2016   Procedure: COLONOSCOPY WITH PROPOFOL;  Surgeon: Daneil Dolin, MD;  [facility];  Service: Endoscopy;  Laterality: N/A;  12:00 pm  . COLONOSCOPY WITH PROPOFOL N/A 11/24/2016   Dr. Gala Romney: Multiple colon polyps removed, tubular adenoma.  Next colonoscopy in 5 years.  . CORONARY ANGIOPLASTY WITH STENT PLACEMENT  01/2015  . CORONARY STENT INTERVENTION N/A 04/27/2017   Procedure: CORONARY STENT INTERVENTION;  Surgeon: Nelva Bush, MD;  Location:  [facility];  Service: Cardiovascular;  Laterality: N/A;  . ELECTROPHYSIOLOGY STUDY N/A 06/29/2017   Procedure: ELECTROPHYSIOLOGY STUDY;  Surgeon: Evans Lance, MD;  Location: [facility];  Service: Cardiovascular;  Laterality: N/A;  . ESOPHAGOGASTRODUODENOSCOPY (EGD) WITH PROPOFOL N/A 10/09/2016   Dr. Gala Romney: LA grade a esophagitis.  Barrett's esophagus, biopsy-proven.  Small hiatal hernia.  EGD February 2019  . ESOPHAGOGASTRODUODENOSCOPY (EGD) WITH PROPOFOL N/A 11/28/2019   Procedure: ESOPHAGOGASTRODUODENOSCOPY (EGD) WITH PROPOFOL;  Surgeon: Daneil Dolin, MD;   [facility];  Service: Endoscopy;  Laterality: N/A;  7:30am  . INCISION / DRAINAGE HAND / FINGER    . LEFT HEART CATH AND CORONARY ANGIOGRAPHY N/A 04/27/2017   Procedure: LEFT HEART CATH AND CORONARY ANGIOGRAPHY;  Surgeon: Nelva Bush, MD;  Location: [facility];  Service: Cardiovascular;  Laterality: N/A;  . LOOP RECORDER INSERTION  06/29/2017   Procedure: Loop Recorder Insertion;  Surgeon: Evans Lance, MD;  Location: [facility];  Service: Cardiovascular;;  . POLYPECTOMY  11/24/2016   Procedure: POLYPECTOMY;  Surgeon: Daneil Dolin, MD;  [facility];  Service: Endoscopy;;  descending and sigmoid  . TOTAL HIP ARTHROPLASTY Right 07/01/2016  . TOTAL HIP ARTHROPLASTY Right 07/01/2016   Procedure: RIGHT TOTAL HIP ARTHROPLASTY ANTERIOR APPROACH;  Surgeon: Mcarthur Rossetti, MD;  Location: [facility];  Service: Orthopedics;  Laterality: Right;   Social History   Socioeconomic History  . Marital status: Significant Other    Spouse name: Randy Hebert  . Number of children: 3  . Years of education: 49  . Highest education level: Not on file  Occupational History  . Occupation: SSI  Tobacco Use  . Smoking status: Current Every Day Smoker    Packs/day: 1.00    Years: 46.00    Pack years: 46.00    Types: Cigarettes  . Smokeless tobacco: Former Systems developer    Types: Secondary school teacher  . Vaping Use: Former  Substance and Sexual Activity  . Alcohol use: Yes    Alcohol/week: 0.0 standard drinks    Comment: half a pint every other day  . Drug use: No    Comment: history of drug use- marijuana, cocaine- quit 2014  . Sexual activity: Yes    Partners: Female    Birth control/protection: None  Other Topics Concern  . Not on file  Social History Narrative   Lives with girlfriend [name]   3 children, but 1 OD  In 2019.   Grandchildren-2      Two cats: may and princess; and a dog-foxy      Enjoy: spending time with pets, TV, yard work       Diet: eats all  food groups   Caffeine: half and half coffee, some tea-half and half decaf   Water: 6-8 cups daily      Wears seat belt   Does not use phone while driving   Oceanographer at home      Social Determinants of Health   Financial Resource Strain: Low Risk   . Difficulty of Paying Living Expenses: Not hard at all  Food Insecurity: No Food Insecurity  . Worried About Charity fundraiser in the Last Year: Never true  . Ran Out of Food in the Last Year: Never true  Transportation Needs: No Transportation Needs  . Lack of Transportation (Medical): No  . Lack of Transportation (Non-Medical): No  Physical Activity: Inactive  . Days of Exercise per  Week: 0 days  . Minutes of Exercise per Session: 0 min  Stress: No Stress Concern Present  . Feeling of Stress : Only a little  Social Connections: Moderately Integrated  . Frequency of Communication with Friends and Family: More than three times a week  . Frequency of Social Gatherings with Friends and Family: More than three times a week  . Attends Religious Services: More than 4 times per year  . Active Member of Clubs or Organizations: No  . Attends Archivist Meetings: Never  . Marital Status: Living with partner   Outpatient Encounter Medications as of 02/20/2020  Medication Sig  . acetaminophen (TYLENOL) 500 MG tablet Take 1,000 mg by mouth every 4 (four) hours as needed for mild pain or fever.   Marland Kitchen albuterol (PROVENTIL) (2.5 MG/3ML) 0.083% nebulizer solution Take 3 mLs (2.5 mg total) by nebulization every 6 (six) hours as needed for wheezing or shortness of breath.  Marland Kitchen amLODipine (NORVASC) 10 MG tablet TAKE 1 TABLET BY MOUTH ONCE A DAY. (Patient taking differently: Take 10 mg by mouth daily. )  . ascorbic acid (VITAMIN C) 500 MG tablet Take 1 tablet (500 mg total) by mouth daily. (Patient taking differently: Take 500 mg by mouth every evening. )  . aspirin 81 MG EC tablet Take 1 tablet (81 mg total) by mouth daily. (Patient  taking differently: Take 81 mg by mouth 2 (two) times daily. )  . atorvastatin (LIPITOR) 40 MG tablet TAKE 1 TABLET BY MOUTH ONCE A DAY. (Patient taking differently: Take 40 mg by mouth every evening. )  . azelastine (ASTELIN) 0.1 % nasal spray USE 2 SPRAYS IN EACH NOSTRIL TWICE DAILY.  . budesonide-formoterol (SYMBICORT) 160-4.5 MCG/ACT inhaler Inhale 2 puffs into the lungs 2 (two) times daily.  . Carboxymeth-Glycerin-Polysorb (REFRESH OPTIVE ADVANCED) 0.5-1-0.5 % SOLN Apply 1 drop to eye 2 (two) times daily as needed (dry eyes).   . Cholecalciferol (VITAMIN D3) 125 MCG (5000 UT) CAPS Take 1 capsule (5,000 Units total) by mouth daily. (Patient taking differently: Take 5,000 Units by mouth daily in the afternoon. )  . citalopram (CELEXA) 20 MG tablet Take 20 mg by mouth daily.  . cycloSPORINE (RESTASIS) 0.05 % ophthalmic emulsion Place 1 drop into both eyes 2 (two) times daily.  . ferrous sulfate 325 (65 FE) MG tablet Take 325 mg by mouth daily with breakfast.  . furosemide (LASIX) 40 MG tablet Take 1 tablet (40 mg total) by mouth daily as needed for fluid. (Patient taking differently: Take 40 mg by mouth daily as needed for fluid (2-3x's a week). )  . GLIPIZIDE XL 5 MG 24 hr tablet TAKE 1 TABLET BY MOUTH DAILY WITH BREAKFAST.  . Magnesium 200 MG TABS Take 1 tablet (200 mg total) by mouth daily. (Patient taking differently: Take 200 mg by mouth every evening. )  . metFORMIN (GLUCOPHAGE) 1000 MG tablet Take 1 tablet (1,000 mg total) by mouth 2 (two) times daily with a meal.  . mometasone (ELOCON) 0.1 % cream Apply 1 application topically 2 (two) times a week.   . nitroGLYCERIN (NITROSTAT) 0.4 MG SL tablet Place 1 tablet (0.4 mg total) under the tongue every 5 (five) minutes as needed for chest pain.  . pantoprazole (PROTONIX) 40 MG tablet TAKE 1 TABLET BY MOUTH ONCE A DAY.  Marland Kitchen PAZEO 0.7 % SOLN Place 1 drop into both eyes daily.   . prednisoLONE acetate (PRED FORTE) 1 % ophthalmic suspension Place 4  drops into both eyes 2 (  two) times a week.   Marland Kitchen PROVENTIL HFA 108 (90 Base) MCG/ACT inhaler INHALE 2 PUFFS INTO THE LUNGS EVERY 6 HOURS AS NEEDED FOR SHORTNESS OF BREATH OR WHEEZING. (Patient taking differently: Inhale 2 puffs into the lungs every 6 (six) hours as needed for wheezing or shortness of breath. )  . SPIRIVA RESPIMAT 2.5 MCG/ACT AERS INHALE 1 PUFF INTO THE LUNGS DAILY. (Patient taking differently: Inhale 1 puff into the lungs daily. )   No facility-administered encounter medications on file as of 02/20/2020.    ALLERGIES: Allergies  Allergen Reactions  . Carvedilol Other (See Comments)    Sinus pause on telemetry >3 seconds. Longest one 9 sec. No AV nodal agent  . Lisinopril Anaphylaxis, Shortness Of Breath and Swelling    Angioedema, required intubation and mechanical ventilation  . Amoxicillin Nausea And Vomiting and Nausea Only    Nausea only - no allergy  Did it involve swelling of the face/tongue/throat, SOB, or low BP? No Did it involve sudden or severe rash/hives, skin peeling, or any reaction on the inside of your mouth or nose? No Did you need to seek medical attention at a hospital or doctor's office? No When did it last happen?childhood reaction If all above answers are "NO", may proceed with cephalosporin use.     VACCINATION STATUS: Immunization History  Administered Date(s) Administered  . Influenza,inj,Quad PF,6+ Mos 07/03/2016, 05/07/2017  . Influenza-Unspecified 05/17/2018  . Pneumococcal Conjugate-13 06/17/2017  . Pneumococcal Polysaccharide-23 02/07/2015  . Tdap 05/17/2019    Diabetes He presents for his follow-up diabetic visit. He has type 2 diabetes mellitus. Onset time: He was diagnosed at approximate age of 56 years. His disease course has been improving. There are no hypoglycemic associated symptoms. Pertinent negatives for hypoglycemia include no confusion, headaches, pallor or seizures. Pertinent negatives for diabetes include no chest  pain, no fatigue, no polydipsia, no polyphagia, no polyuria and no weakness. There are no hypoglycemic complications. Symptoms are improving. Diabetic complications include heart disease. Risk factors for coronary artery disease include dyslipidemia, diabetes mellitus, hypertension, male sex, family history, obesity, sedentary lifestyle and tobacco exposure. Current diabetic treatment includes oral agent (monotherapy). He is compliant with treatment most of the time. His weight is fluctuating minimally. He is following a generally unhealthy diet. When asked about meal planning, he reported none. He has had a previous visit with a dietitian. He never participates in exercise. His breakfast blood glucose range is generally 130-140 mg/dl. His overall blood glucose range is 130-140 mg/dl. (He did not bring any logs nor meter.  He denies hypoglycemia.  He reports blood glucose range between 115-188.  His point-of-care A1c 6.5%, overall improving from 7.5%.  ) An ACE inhibitor/angiotensin II receptor blocker is contraindicated (He has documented allergy for ACE inhibitor's). He does not see a podiatrist.Eye exam is not current.  Hyperlipidemia This is a chronic problem. The current episode started more than 1 year ago. Exacerbating diseases include diabetes and obesity. Pertinent negatives include no chest pain, myalgias or shortness of breath. Current antihyperlipidemic treatment includes statins. Risk factors for coronary artery disease include dyslipidemia, diabetes mellitus, family history, obesity, male sex, hypertension and a sedentary lifestyle.  Hypertension This is a chronic problem. The current episode started more than 1 year ago. Pertinent negatives include no chest pain, headaches, neck pain, palpitations or shortness of breath. Risk factors for coronary artery disease include diabetes mellitus, dyslipidemia, obesity, sedentary lifestyle and smoking/tobacco exposure. Past treatments include calcium  channel blockers. Hypertensive end-organ damage includes  CAD/MI.    Review of systems  Constitutional: + Minimally fluctuating body weight,  current  Body mass index is 34.38 kg/m. , no fatigue, no subjective hyperthermia, no subjective hypothermia Eyes: no blurry vision, no xerophthalmia ENT: no sore throat, no nodules palpated in throat, no dysphagia/odynophagia, no hoarseness Cardiovascular: no Chest Pain, no Shortness of Breath, no palpitations, no leg swelling Respiratory: no cough, no shortness of breath Gastrointestinal: no Nausea/Vomiting/Diarhhea Musculoskeletal: no muscle/joint aches Skin: no rashes, no hyperemia Neurological: no tremors, no numbness, no tingling, no dizziness Psychiatric: no depression, no anxiety    Objective:    BP (!) 142/80 (BP Location: Right Arm, Patient Position: Sitting)   Pulse 89   Ht 5\' 5"  (1.651 m)   Wt 206 lb 9.6 oz (93.7 kg)   BMI 34.38 kg/m   Wt Readings from Last 3 Encounters:  02/20/20 206 lb (93.4 kg)  02/20/20 206 lb 9.6 oz (93.7 kg)  12/08/19 201 lb (91.2 kg)     Physical Exam- Limited  Constitutional:  Body mass index is 34.38 kg/m. , not in acute distress, normal state of mind Eyes:  EOMI, no exophthalmos Neck: Supple Thyroid: No gross goiter Respiratory: Adequate breathing efforts Musculoskeletal: no gross deformities, strength intact in all four extremities, no gross restriction of joint movements Skin:  no rashes, no hyperemia Neurological: no tremor with outstretched hands,   CMP     Component Value Date/Time   NA 138 10/11/2019 0406   K 4.3 10/11/2019 0406   CL 102 10/11/2019 0406   CO2 28 10/11/2019 0406   GLUCOSE 137 (H) 10/11/2019 0406   BUN 14 10/11/2019 0406   CREATININE 0.66 10/11/2019 0406   CREATININE 0.74 07/14/2019 0710   CALCIUM 9.3 10/11/2019 0406   PROT 7.2 10/11/2019 0406   ALBUMIN 3.6 10/11/2019 0406   AST 91 (H) 10/11/2019 0406   ALT 80 (H) 10/11/2019 0406   ALKPHOS 99 10/11/2019 0406    BILITOT 0.8 10/11/2019 0406   GFRNONAA >60 10/11/2019 0406   GFRNONAA 105 07/14/2019 0710   GFRAA >60 10/11/2019 0406   GFRAA 121 07/14/2019 0710    Diabetic Labs (most recent): Lab Results  Component Value Date   HGBA1C 6.5 (A) 02/20/2020   HGBA1C 6.8 (H) 10/07/2019   HGBA1C 7.5 (H) 07/14/2019     Lipid Panel ( most recent) Lipid Panel     Component Value Date/Time   CHOL 154 11/09/2019 0714   TRIG 319 (H) 11/09/2019 0714   HDL 30 (L) 11/09/2019 0714   CHOLHDL 5.1 (H) 11/09/2019 0714   VLDL 41 (H) 04/27/2017 0505   LDLCALC 86 11/09/2019 0714      Lab Results  Component Value Date   TSH 1.58 11/03/2018   TSH 1.79 11/17/2017   TSH 1.635 04/26/2017   TSH 2.564 06/27/2015   FREET4 1.5 11/03/2018   FREET4 1.4 11/17/2017      Assessment & Plan:   1. DM type 2 causing vascular disease ([place])  - Randy Hebert has currently uncontrolled symptomatic type 2 DM since  54 years of age.  Once again, he did not bring any logs nor meter.  He denies hypoglycemia.  He reports blood glucose range between 115-188.  His point-of-care A1c 6.5%, overall improving from 7.5%.     -his diabetes is complicated by  coronary artery disease which required stent placement, obesity/sedentary life, chronic heavy smoking, and QURAN VASCO remains at a high risk for more acute and chronic complications which include  CAD, CVA, CKD, retinopathy, and neuropathy. These are all discussed in detail with the patient.  - I have counseled him on diet management and weight loss, by adopting a carbohydrate restricted/protein rich diet. -Still admits to dietary indiscretions including consumption of sweets and sweetened beverages.  - he  admits there is a room for improvement in his diet and drink choices. -  Suggestion is made for him to avoid simple carbohydrates  from his diet including Cakes, Sweet Desserts / Pastries, Ice Cream, Soda (diet and regular), Sweet Tea, Candies, Chips, Cookies, Sweet  Pastries,  Store Bought Juices, Alcohol in Excess of  1-2 drinks a day, Artificial Sweeteners, Coffee Creamer, and "Sugar-free" Products. This will help patient to have stable blood glucose profile and potentially avoid unintended weight gain.   - I encouraged him to switch to  unprocessed or minimally processed complex starch and increased protein intake (animal or plant source), fruits, and vegetables.  - he is advised to stick to a routine mealtimes to eat 3 meals  a day and avoid unnecessary snacks ( to snack only to correct hypoglycemia).    - I have approached him with the following individualized plan to manage diabetes and patient agrees:   -He has maintained reasonable control of his diabetes without insulin.  He is benefiting and tolerating low-dose glipizide.   -Given his history of coronary artery disease, will benefit from maintaining tight control of diabetes.   -He is advised to continue Metformin 1000 mg p.o. twice daily-daily after breakfast and after supper.     -He is advised to continue glipizide 5 mg XL p.o. daily at breakfast.   -He is approached to continue monitoring blood glucose at least once a day-daily before breakfast.   -He is encouraged to call clinic for blood glucose readings less than 70 or greater than 200x3 in a week.   -Patient specific target  A1c;  LDL, HDL, Triglycerides, and  Waist Circumference were discussed in detail.  2) BP/HTN:  -His blood pressure is controlled to target.  -He remains a chronic heavy smoker. He has a documented allergy of ACE inhibitors anaphylaxis .  He is advised to continue amlodipine 10 mg p.o. daily, with as needed diuretics.  He is extensively counseled for smoking cessation.     3) Lipids/HPL: Recent lipid panel showed controlled LDL at 86, triglycerides still high at 215.  He is advised to avoid butter and fried food.  Due to elevated liver enzymes (which are mainly due to his alcohol abuse) his atorvastatin was lowered  to 40 mg p.o. nightly.   -  Patient continues to use alcohol heavily, could explain the transaminitis.  His most recent labs show improvement in his liver enzymes.  He is advised to consider weaning of alcohol off.  4)  Weight/Diet: His BMI is 29.5-JOACZYS complicating his diabetes care.  He is a candidate for modest weight loss.  CDE Consult has been initiated and in progress,  exercise, and detailed carbohydrates information provided.  5) Chronic Care/Health Maintenance:  -he  is on Statin medications and  is encouraged to continue to follow up with Ophthalmology, Dentist,  Podiatrist at least yearly or according to recommendations, and advised to  Quit smoking ( is a chronic heavy smoker for the last 40 days), and consider weaning off of alcohol.  I have recommended yearly flu vaccine and pneumonia vaccination at least every 5 years; moderate intensity exercise for up to 150 minutes weekly; and  sleep for at  least 7 hours a day.  - I advised patient to maintain close follow up with Perlie Mayo, NP for primary care needs.  - Time spent on this patient care encounter:  35 min, of which > 50% was spent in  counseling and the rest reviewing his blood glucose logs , discussing his hypoglycemia and hyperglycemia episodes, reviewing his current and  previous labs / studies  ( including abstraction from other facilities) and medications  doses and developing a  long term treatment plan and documenting his care.   Please refer to Patient Instructions for Blood Glucose Monitoring and Insulin/Medications Dosing Guide"  in media tab for additional information. Please  also refer to " Patient Self Inventory" in the Media  tab for reviewed elements of pertinent patient history.  Randy Hebert participated in the discussions, expressed understanding, and voiced agreement with the above plans.  All questions were answered to his satisfaction. he is encouraged to contact clinic should he have any questions  or concerns prior to his return visit.   Follow up plan: - Return in about 4 months (around 06/21/2020) for F/U with Pre-visit Labs, Meter, Logs, A1c here.Glade Lloyd, MD [facility] [address] [place], [place] [address] Phone: [phone]  Fax: [phone]    02/20/2020, 5:26 PM  This note was partially dictated with voice recognition software. Similar sounding words can be transcribed inadequately or may not  be corrected upon review.

## 2020-02-20 NOTE — Patient Instructions (Addendum)
  Goals  Quit smoking by August 1st. Measure foods out-cantalope and watermelon especially.. Stop using salt with watermelon. Walk 30 minutes a day Get A1C 6.3% or less Lose 3 lbs per month, Don't put salt on fruit or other foods.

## 2020-02-21 ENCOUNTER — Encounter: Payer: Self-pay | Admitting: "Endocrinology

## 2020-02-22 ENCOUNTER — Ambulatory Visit (INDEPENDENT_AMBULATORY_CARE_PROVIDER_SITE_OTHER): Payer: Medicare Other

## 2020-02-22 DIAGNOSIS — J309 Allergic rhinitis, unspecified: Secondary | ICD-10-CM | POA: Diagnosis not present

## 2020-02-28 ENCOUNTER — Encounter: Payer: Self-pay | Admitting: Gastroenterology

## 2020-02-28 ENCOUNTER — Encounter: Payer: Self-pay | Admitting: *Deleted

## 2020-02-28 ENCOUNTER — Ambulatory Visit (INDEPENDENT_AMBULATORY_CARE_PROVIDER_SITE_OTHER): Payer: Medicare Other | Admitting: Gastroenterology

## 2020-02-28 ENCOUNTER — Other Ambulatory Visit: Payer: Self-pay

## 2020-02-28 VITALS — BP 136/81 | HR 91 | Temp 97.5°F | Ht 65.0 in | Wt 204.0 lb

## 2020-02-28 DIAGNOSIS — K760 Fatty (change of) liver, not elsewhere classified: Secondary | ICD-10-CM

## 2020-02-28 DIAGNOSIS — K2270 Barrett's esophagus without dysplasia: Secondary | ICD-10-CM

## 2020-02-28 NOTE — Patient Instructions (Addendum)
Continue Protonix once daily.  I recommend complete avoidance of alcohol to avoid damaging your liver.   I have ordered an ultrasound. We may need to do more blood work sooner than when you have it scheduled.  Please follow a low salt diet (no more than 2 grams per day).   We will see you back in 6 months!  I enjoyed seeing you again today! As you know, I value our relationship and want to provide genuine, compassionate, and quality care. I welcome your feedback. If you receive a survey regarding your visit,  I greatly appreciate you taking time to fill this out. See you next time!  Annitta Needs, PhD, ANP-BC [facility]   Low-Sodium Eating Plan Sodium, which is an element that makes up salt, helps you maintain a healthy balance of fluids in your body. Too much sodium can increase your blood pressure and cause fluid and waste to be held in your body. Your health care provider or dietitian may recommend following this plan if you have high blood pressure (hypertension), kidney disease, liver disease, or heart failure. Eating less sodium can help lower your blood pressure, reduce swelling, and protect your heart, liver, and kidneys. What are tips for following this plan? General guidelines  Most people on this plan should limit their sodium intake to 1,500-2,000 mg (milligrams) of sodium each day. Reading food labels   The Nutrition Facts label lists the amount of sodium in one serving of the food. If you eat more than one serving, you must multiply the listed amount of sodium by the number of servings.  Choose foods with less than 140 mg of sodium per serving.  Avoid foods with 300 mg of sodium or more per serving. Shopping  Look for lower-sodium products, often labeled as "low-sodium" or "no salt added."  Always check the sodium content even if foods are labeled as "unsalted" or "no salt added".  Buy fresh foods. ? Avoid canned foods and premade or frozen  meals. ? Avoid canned, cured, or processed meats  Buy breads that have less than 80 mg of sodium per slice. Cooking  Eat more home-cooked food and less restaurant, buffet, and fast food.  Avoid adding salt when cooking. Use salt-free seasonings or herbs instead of table salt or sea salt. Check with your health care provider or pharmacist before using salt substitutes.  Cook with plant-based oils, such as canola, sunflower, or olive oil. Meal planning  When eating at a restaurant, ask that your food be prepared with less salt or no salt, if possible.  Avoid foods that contain MSG (monosodium glutamate). MSG is sometimes added to [place] food, bouillon, and some canned foods. What foods are recommended? The items listed may not be a complete list. Talk with your dietitian about what dietary choices are best for you. Grains Low-sodium cereals, including oats, puffed wheat and rice, and shredded wheat. Low-sodium crackers. Unsalted rice. Unsalted pasta. Low-sodium bread. Whole-grain breads and whole-grain pasta. Vegetables Fresh or frozen vegetables. "No salt added" canned vegetables. "No salt added" tomato sauce and paste. Low-sodium or reduced-sodium tomato and vegetable juice. Fruits Fresh, frozen, or canned fruit. Fruit juice. Meats and other protein foods Fresh or frozen (no salt added) meat, poultry, seafood, and fish. Low-sodium canned tuna and salmon. Unsalted nuts. Dried peas, beans, and lentils without added salt. Unsalted canned beans. Eggs. Unsalted nut butters. Dairy Milk. Soy milk. Cheese that is naturally low in sodium, such as ricotta cheese, fresh mozzarella, or Swiss  cheese Low-sodium or reduced-sodium cheese. Cream cheese. Yogurt. Fats and oils Unsalted butter. Unsalted margarine with no trans fat. Vegetable oils such as canola or olive oils. Seasonings and other foods Fresh and dried herbs and spices. Salt-free seasonings. Low-sodium mustard and ketchup. Sodium-free  salad dressing. Sodium-free light mayonnaise. Fresh or refrigerated horseradish. Lemon juice. Vinegar. Homemade, reduced-sodium, or low-sodium soups. Unsalted popcorn and pretzels. Low-salt or salt-free chips. What foods are not recommended? The items listed may not be a complete list. Talk with your dietitian about what dietary choices are best for you. Grains Instant hot cereals. Bread stuffing, pancake, and biscuit mixes. Croutons. Seasoned rice or pasta mixes. Noodle soup cups. Boxed or frozen macaroni and cheese. Regular salted crackers. Self-rising flour. Vegetables Sauerkraut, pickled vegetables, and relishes. Olives. [place] fries. Onion rings. Regular canned vegetables (not low-sodium or reduced-sodium). Regular canned tomato sauce and paste (not low-sodium or reduced-sodium). Regular tomato and vegetable juice (not low-sodium or reduced-sodium). Frozen vegetables in sauces. Meats and other protein foods Meat or fish that is salted, canned, smoked, spiced, or pickled. Bacon, ham, sausage, hotdogs, corned beef, chipped beef, packaged lunch meats, salt pork, jerky, pickled herring, anchovies, regular canned tuna, sardines, salted nuts. Dairy Processed cheese and cheese spreads. Cheese curds. Blue cheese. Feta cheese. String cheese. Regular cottage cheese. Buttermilk. Canned milk. Fats and oils Salted butter. Regular margarine. Ghee. Bacon fat. Seasonings and other foods Onion salt, garlic salt, seasoned salt, table salt, and sea salt. Canned and packaged gravies. Worcestershire sauce. Tartar sauce. Barbecue sauce. Teriyaki sauce. Soy sauce, including reduced-sodium. Steak sauce. Fish sauce. Oyster sauce. Cocktail sauce. Horseradish that you find on the shelf. Regular ketchup and mustard. Meat flavorings and tenderizers. Bouillon cubes. Hot sauce and Tabasco sauce. Premade or packaged marinades. Premade or packaged taco seasonings. Relishes. Regular salad dressings. Salsa. Potato and tortilla  chips. Corn chips and puffs. Salted popcorn and pretzels. Canned or dried soups. Pizza. Frozen entrees and pot pies. Summary  Eating less sodium can help lower your blood pressure, reduce swelling, and protect your heart, liver, and kidneys.  Most people on this plan should limit their sodium intake to 1,500-2,000 mg (milligrams) of sodium each day.  Canned, boxed, and frozen foods are high in sodium. Restaurant foods, fast foods, and pizza are also very high in sodium. You also get sodium by adding salt to food.  Try to cook at home, eat more fresh fruits and vegetables, and eat less fast food, canned, processed, or prepared foods. This information is not intended to replace advice given to you by your health care provider. Make sure you discuss any questions you have with your health care provider. Document Revised: 07/31/2017 Document Reviewed: 08/11/2016 Elsevier Patient Education  2020 [employer].

## 2020-02-28 NOTE — Progress Notes (Signed)
Referring Provider: Rosita Fire, MD Primary Care Physician:  Perlie Mayo, NP Primary GI: Dr Gala Romney   Chief Complaint  Patient presents with  . barrett's    HPI:   Randy Hebert is a 54 y.o. male presenting today with a history of Barrett's, fatty liver(with negative Hep C antibody, negative Hep B surface antigen, negative hemochromatosis screen), adenomas with surveillance due in 2023. Recent EGD completed March 2021 with Barrett's and portal gastropathy. Surveillance due in 2023.  Protonix once daily.   US abdomen Jan 2021 with fatty liver, normal spleen. Cutting back drinking. Will drink 1 pint for the entire weekend. Blood work upcoming in about 4 months. Feb 2021 AST 91, ALT 80. Was inpatient with pneumonia, fluctuating LFTs. Adds salt to food. No abdominal pain, NV. GERD controlled. No overt GI bleeding.      Past Medical History:  Diagnosis Date  . ACE inhibitor-aggravated angioedema   . Allergy   . Angio-edema   . Anxiety   . Arthritis   . Asthma    hip replacement  . Back pain   . Bradycardia 04/28/2017  . Bulging of cervical intervertebral disc   . CAD (coronary artery disease)    lateral STEMI 02/06/2015 00% D1 occlusion treated with Promus Premier 2.5 mm x 16 mm DES, 70% ramus stenosis, 40% mid RCA stenosis, 45% distal RCA stenosis, EF 45-50%  . CHF (congestive heart failure) ([place])   . COPD (chronic obstructive pulmonary disease) ([place])   . Depression   . Diabetes mellitus without complication ([place])   . 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 Dry eye   . Dyspnea   . Early satiety 09/23/2016  . GERD (gastroesophageal reflux disease)   . HCAP (healthcare-associated pneumonia) 05/15/2017  . Headache   . Heart murmur   . Hip pain   . Hyperlipidemia   . Hypertension   . Lobar pneumonia ([place]) 05/15/2017  . Melena 08/04/2018  . MI (myocardial infarction) ([place])   . Myocardial infarction ([place])   . Neck pain   . Non-ST  elevation (NSTEMI) myocardial infarction ([place]) 04/27/2017  . NSTEMI (non-ST elevated myocardial infarction) ([place]) 04/26/2017  . Otitis media   . Pleurisy   . Pneumonia due to COVID-19 virus 10/07/2019  . Rectal bleeding 11/08/2018  . Sinus pause    9 sec sinus pause on telemetry after started on coreg after MI, avoid AV nodal blocking agent  . Sleep apnea   . 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 STEMI (ST elevation myocardial infarction) ([place]) 05/31/2015  . Substance abuse ([place]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  . Syncope 06/29/2017  . Syncope and collapse 05/15/2017  . Transaminitis 08/04/2018  . Unilateral primary osteoarthritis, right hip 07/01/2016    Past Surgical History:  Procedure Laterality Date  . BIOPSY  10/09/2016   Procedure: BIOPSY;  Surgeon: Daneil Dolin, MD;  [facility];  Service: Endoscopy;;  . BIOPSY  11/28/2019   Procedure: BIOPSY;  Surgeon: Daneil Dolin, MD;  [facility];  Service: Endoscopy;;  . CARDIAC CATHETERIZATION N/A 02/06/2015   Procedure: Left Heart Cath and Coronary Angiography;  Surgeon: Leonie Man, MD;  Location: [place] [facility];  Service: Cardiovascular;  Laterality: N/A;  . CARDIAC CATHETERIZATION N/A 02/06/2015   Procedure: Coronary Stent Intervention;  Surgeon: Leonie Man, MD;  Location: [place] [facility];  Service: Cardiovascular;  Laterality: N/A;  .  COLONOSCOPY WITH PROPOFOL N/A 10/09/2016   Sigmoid and descending colon diverticulosis, four 4-6 mm polyps in sigmoid, one 4 mm polyp in descending. Tubular adenomas and hyperplastic. 5 year surveillance.   . COLONOSCOPY WITH PROPOFOL N/A 11/24/2016   Sigmoid and descending colon diverticulosis, four 4-6 mm polyps in sigmoid, one 4 mm polyp in descending. Tubular adenomas and hyperplastic. 5 year surveillance.   . CORONARY ANGIOPLASTY WITH STENT PLACEMENT  01/2015  . CORONARY STENT INTERVENTION N/A 04/27/2017   Procedure: CORONARY STENT INTERVENTION;  Surgeon: Nelva Bush, MD;  Location: [place] [facility];  Service: Cardiovascular;  Laterality: N/A;  . ELECTROPHYSIOLOGY STUDY N/A 06/29/2017   Procedure: ELECTROPHYSIOLOGY STUDY;  Surgeon: Evans Lance, MD;  Location: [place] [facility];  Service: Cardiovascular;  Laterality: N/A;  . ESOPHAGOGASTRODUODENOSCOPY (EGD) WITH PROPOFOL N/A 10/09/2016   Dr. Gala Romney: LA grade a esophagitis.  Barrett's esophagus, biopsy-proven.  Small hiatal hernia.  EGD February 2019  . ESOPHAGOGASTRODUODENOSCOPY (EGD) WITH PROPOFOL N/A 11/28/2019    salmon-colored esophageal mucosa (Barrett's) small hiatal hernia, portal hypertensive gastropathy, normal duodenum, 3 year surveillance  . INCISION / DRAINAGE HAND / FINGER    . LEFT HEART CATH AND CORONARY ANGIOGRAPHY N/A 04/27/2017   Procedure: LEFT HEART CATH AND CORONARY ANGIOGRAPHY;  Surgeon: Nelva Bush, MD;  Location: [place] [facility];  Service: Cardiovascular;  Laterality: N/A;  . LOOP RECORDER INSERTION  06/29/2017   Procedure: Loop Recorder Insertion;  Surgeon: Evans Lance, MD;  Location: [place] [facility];  Service: Cardiovascular;;  . POLYPECTOMY  11/24/2016   Procedure: POLYPECTOMY;  Surgeon: Daneil Dolin, MD;  [facility];  Service: Endoscopy;;  descending and sigmoid  . TOTAL HIP ARTHROPLASTY Right 07/01/2016  . TOTAL HIP ARTHROPLASTY Right 07/01/2016   Procedure: RIGHT TOTAL HIP ARTHROPLASTY ANTERIOR APPROACH;  Surgeon: Mcarthur Rossetti, MD;  Location: [place];  Service: Orthopedics;  Laterality: Right;    Current Outpatient Medications  Medication Sig Dispense Refill  . acetaminophen (TYLENOL) 500 MG tablet Take 1,000 mg by mouth every 4 (four) hours as needed for mild pain or fever.     Marland Kitchen albuterol (PROVENTIL) (2.5 MG/3ML) 0.083% nebulizer solution Take 3 mLs (2.5 mg total) by nebulization every 6 (six) hours as needed for wheezing or shortness of breath. 150 mL 1  . amLODipine (NORVASC) 10 MG tablet TAKE 1 TABLET BY MOUTH ONCE A DAY.  (Patient taking differently: Take 10 mg by mouth daily. ) 30 tablet 0  . ascorbic acid (VITAMIN C) 500 MG tablet Take 1 tablet (500 mg total) by mouth daily. (Patient taking differently: Take 500 mg by mouth every evening. ) 30 tablet 1  . aspirin 81 MG EC tablet Take 1 tablet (81 mg total) by mouth daily. (Patient taking differently: Take 81 mg by mouth 2 (two) times daily. ) 30 tablet   . atorvastatin (LIPITOR) 40 MG tablet TAKE 1 TABLET BY MOUTH ONCE A DAY. (Patient taking differently: Take 40 mg by mouth every evening. ) 30 tablet 0  . azelastine (ASTELIN) 0.1 % nasal spray USE 2 SPRAYS IN EACH NOSTRIL TWICE DAILY. 30 mL 5  . budesonide-formoterol (SYMBICORT) 160-4.5 MCG/ACT inhaler Inhale 2 puffs into the lungs 2 (two) times daily. 1 Inhaler 5  . Carboxymeth-Glycerin-Polysorb (REFRESH OPTIVE ADVANCED) 0.5-1-0.5 % SOLN Apply 1 drop to eye 2 (two) times daily as needed (dry eyes).     . Cholecalciferol (VITAMIN D3) 125 MCG (5000 UT) CAPS Take 1 capsule (5,000 Units total)  by mouth daily. (Patient taking differently: Take 5,000 Units by mouth daily in the afternoon. ) 90 capsule 0  . citalopram (CELEXA) 20 MG tablet Take 20 mg by mouth daily.    . cycloSPORINE (RESTASIS) 0.05 % ophthalmic emulsion Place 1 drop into both eyes 2 (two) times daily.    . ferrous sulfate 325 (65 FE) MG tablet Take 325 mg by mouth daily with breakfast.    . furosemide (LASIX) 40 MG tablet Take 1 tablet (40 mg total) by mouth daily as needed for fluid. (Patient taking differently: Take 40 mg by mouth daily as needed for fluid (2-3x's a week). )    . GLIPIZIDE XL 5 MG 24 hr tablet TAKE 1 TABLET BY MOUTH DAILY WITH BREAKFAST. 30 tablet 2  . Magnesium 200 MG TABS Take 1 tablet (200 mg total) by mouth daily. (Patient taking differently: Take 200 mg by mouth every evening. ) 30 each 6  . metFORMIN (GLUCOPHAGE) 1000 MG tablet Take 1 tablet (1,000 mg total) by mouth 2 (two) times daily with a meal. 180 tablet 3  . mometasone  (ELOCON) 0.1 % cream Apply 1 application topically 2 (two) times a week.     . nitroGLYCERIN (NITROSTAT) 0.4 MG SL tablet Place 1 tablet (0.4 mg total) under the tongue every 5 (five) minutes as needed for chest pain. 25 tablet 0  . pantoprazole (PROTONIX) 40 MG tablet TAKE 1 TABLET BY MOUTH ONCE A DAY. 90 tablet 3  . PAZEO 0.7 % SOLN Place 1 drop into both eyes daily.     . prednisoLONE acetate (PRED FORTE) 1 % ophthalmic suspension Place 4 drops into both eyes 2 (two) times a week.     Marland Kitchen PROVENTIL HFA 108 (90 Base) MCG/ACT inhaler INHALE 2 PUFFS INTO THE LUNGS EVERY 6 HOURS AS NEEDED FOR SHORTNESS OF BREATH OR WHEEZING. (Patient taking differently: Inhale 2 puffs into the lungs every 6 (six) hours as needed for wheezing or shortness of breath. ) 18 g 1  . SPIRIVA RESPIMAT 2.5 MCG/ACT AERS INHALE 1 PUFF INTO THE LUNGS DAILY. (Patient taking differently: Inhale 1 puff into the lungs daily. ) 4 g 2   No current facility-administered medications for this visit.    Allergies as of 02/28/2020 - Review Complete 02/28/2020  Allergen Reaction Noted  . Carvedilol Other (See Comments) 02/08/2015  . Lisinopril Anaphylaxis, Shortness Of Breath, and Swelling 05/30/2015  . Amoxicillin Nausea And Vomiting and Nausea Only 02/06/2015    Family History  Problem Relation Age of Onset  . Heart attack Father   . Stroke Father   . Arthritis Father   . Heart disease Father   . Cancer Mother        ???  . Arthritis Mother   . Heart disease Brother 46       died in sleep  . Early death Brother   . Diabetes Maternal Uncle   . Alzheimer's disease Maternal Grandmother     Social History   Socioeconomic History  . Marital status: Significant Other    Spouse name: alice  . Number of children: 3  . Years of education: 53  . Highest education level: Not on file  Occupational History  . Occupation: SSI  Tobacco Use  . Smoking status: Current Every Day Smoker    Packs/day: 1.00    Years: 46.00    Pack  years: 46.00    Types: Cigarettes  . Smokeless tobacco: Former Systems developer    Types: Loss adjuster, chartered  Vaping Use  . Vaping Use: Former  Substance and Sexual Activity  . Alcohol use: Yes    Alcohol/week: 0.0 standard drinks    Comment: half a pint every other day  . Drug use: No    Comment: history of drug use- marijuana, cocaine- quit 2014  . Sexual activity: Yes    Partners: Female    Birth control/protection: None  Other Topics Concern  . Not on file  Social History Narrative   Lives with girlfriend [name]   3 children, but 1 OD  In 2019.   Grandchildren-2      Two cats: may and princess; and a dog-foxy      Enjoy: spending time with pets, TV, yard work       Diet: eats all food groups   Caffeine: half and half coffee, some tea-half and half decaf   Water: 6-8 cups daily      Wears seat belt   Does not use phone while driving   Oceanographer at home      Social Determinants of Health   Financial Resource Strain: Low Risk   . Difficulty of Paying Living Expenses: Not hard at all  Food Insecurity: No Food Insecurity  . Worried About Charity fundraiser in the Last Year: Never true  . Ran Out of Food in the Last Year: Never true  Transportation Needs: No Transportation Needs  . Lack of Transportation (Medical): No  . Lack of Transportation (Non-Medical): No  Physical Activity: Inactive  . Days of Exercise per Week: 0 days  . Minutes of Exercise per Session: 0 min  Stress: No Stress Concern Present  . Feeling of Stress : Only a little  Social Connections: Moderately Integrated  . Frequency of Communication with Friends and Family: More than three times a week  . Frequency of Social Gatherings with Friends and Family: More than three times a week  . Attends Religious Services: More than 4 times per year  . Active Member of Clubs or Organizations: No  . Attends Archivist Meetings: Never  . Marital Status: Living with partner    Review of Systems: Gen: Denies fever,  chills, anorexia. Denies fatigue, weakness, weight loss.  CV: Denies chest pain, palpitations, syncope, peripheral edema, and claudication. Resp: Denies dyspnea at rest, cough, wheezing, coughing up blood, and pleurisy. GI: Dsee HPI Derm: Denies rash, itching, dry skin Psych: Denies depression, anxiety, memory loss, confusion. No homicidal or suicidal ideation.  Heme: Denies bruising, bleeding, and enlarged lymph nodes.  Physical Exam: BP 136/81   Pulse 91   Temp (!) 97.5 F (36.4 C) (Temporal)   Ht 5\' 5"  (1.651 m)   Wt 204 lb (92.5 kg)   BMI 33.95 kg/m  General:   Alert and oriented. No distress noted. Pleasant and cooperative.  Head:  Normocephalic and atraumatic. Eyes:  Conjuctiva clear without scleral icterus. Mouth:  Mask in place Abdomen:  +BS, soft, distended and round, liver margin palpable extending below right costal margin by 9 fingerbreadths, and palpable liver border crossing midclavicular line. Rectus diastasis noted.  Msk:  Symmetrical without gross deformities. Normal posture. Extremities:  Without edema. Neurologic:  Alert and  oriented x4 Psych:  Alert and cooperative. Normal mood and affect.  ASSESSMENT: DONNELLE OLMEDA is a 54 y.o. male presenting today with history of Barrett's, GERD, colon polyps, fatty liver with some concern for possible underlying cirrhosis in light of recent EGD with portal gastropathy. Continues to drink on weekends,  about 1 pint.   He has notable hepatosplenomegaly on exam. Concern for liver disease progression. Labs upcoming in 3 months and will review at that time. In interim, will update [place]. Discussed need for ETOH cessation.    PLAN:   Continue Protonix daily  US abdomen complete in future  Labs upcoming in Oct with Dr. Dorris Fetch. May need sooner after review of [place]  Low salt diet  Return in 6 months or sooner if needed   Annitta Needs, PhD, ANP-BC [facility]

## 2020-03-06 ENCOUNTER — Ambulatory Visit (HOSPITAL_COMMUNITY)
Admission: RE | Admit: 2020-03-06 | Discharge: 2020-03-06 | Disposition: A | Discharge status: Home / Self Care | Payer: Medicare Other | Source: Ambulatory Visit | Attending: Gastroenterology | Admitting: Gastroenterology

## 2020-03-06 ENCOUNTER — Encounter: Payer: Self-pay | Admitting: Gastroenterology

## 2020-03-06 ENCOUNTER — Other Ambulatory Visit: Payer: Self-pay

## 2020-03-06 DIAGNOSIS — K7689 Other specified diseases of liver: Secondary | ICD-10-CM | POA: Diagnosis not present

## 2020-03-06 DIAGNOSIS — K760 Fatty (change of) liver, not elsewhere classified: Secondary | ICD-10-CM | POA: Insufficient documentation

## 2020-03-06 DIAGNOSIS — Q600 Renal agenesis, unilateral: Secondary | ICD-10-CM | POA: Diagnosis not present

## 2020-03-06 DIAGNOSIS — R160 Hepatomegaly, not elsewhere classified: Secondary | ICD-10-CM | POA: Diagnosis not present

## 2020-03-08 ENCOUNTER — Other Ambulatory Visit: Payer: Self-pay | Admitting: "Endocrinology

## 2020-03-09 ENCOUNTER — Ambulatory Visit (INDEPENDENT_AMBULATORY_CARE_PROVIDER_SITE_OTHER): Payer: Medicare Other

## 2020-03-09 DIAGNOSIS — J309 Allergic rhinitis, unspecified: Secondary | ICD-10-CM

## 2020-03-12 ENCOUNTER — Other Ambulatory Visit: Payer: Self-pay

## 2020-03-12 DIAGNOSIS — K760 Fatty (change of) liver, not elsewhere classified: Secondary | ICD-10-CM

## 2020-03-16 ENCOUNTER — Ambulatory Visit (INDEPENDENT_AMBULATORY_CARE_PROVIDER_SITE_OTHER): Payer: Medicare Other

## 2020-03-16 DIAGNOSIS — J309 Allergic rhinitis, unspecified: Secondary | ICD-10-CM | POA: Diagnosis not present

## 2020-03-19 ENCOUNTER — Ambulatory Visit (INDEPENDENT_AMBULATORY_CARE_PROVIDER_SITE_OTHER): Payer: Medicare Other | Admitting: *Deleted

## 2020-03-19 DIAGNOSIS — R55 Syncope and collapse: Secondary | ICD-10-CM | POA: Diagnosis not present

## 2020-03-19 DIAGNOSIS — J301 Allergic rhinitis due to pollen: Secondary | ICD-10-CM | POA: Diagnosis not present

## 2020-03-19 LAB — CUP PACEART REMOTE DEVICE CHECK
Date Time Interrogation Session: 20210718230636
Implantable Pulse Generator Implant Date: 20181029

## 2020-03-19 NOTE — Progress Notes (Signed)
Vials exp 03-19-21

## 2020-03-20 ENCOUNTER — Ambulatory Visit (INDEPENDENT_AMBULATORY_CARE_PROVIDER_SITE_OTHER): Payer: Medicare Other | Admitting: Family Medicine

## 2020-03-20 ENCOUNTER — Encounter: Payer: Self-pay | Admitting: Family Medicine

## 2020-03-20 ENCOUNTER — Other Ambulatory Visit: Payer: Self-pay

## 2020-03-20 VITALS — BP 149/83 | HR 91 | Temp 97.1°F | Resp 18 | Ht 65.0 in | Wt 206.0 lb

## 2020-03-20 DIAGNOSIS — M25511 Pain in right shoulder: Secondary | ICD-10-CM

## 2020-03-20 DIAGNOSIS — J309 Allergic rhinitis, unspecified: Secondary | ICD-10-CM

## 2020-03-20 DIAGNOSIS — I10 Essential (primary) hypertension: Secondary | ICD-10-CM

## 2020-03-20 HISTORY — DX: Pain in right shoulder: M25.511

## 2020-03-20 NOTE — Assessment & Plan Note (Signed)
S&S consistent with possible arthritic changes in shoulder; ortho referral for better work up. RICE measures suggested

## 2020-03-20 NOTE — Progress Notes (Signed)
[facility] Summary Report / Loop Recorder 

## 2020-03-20 NOTE — Patient Instructions (Signed)
I appreciate the opportunity to provide you with care for your health and wellness. Today we discussed: Right Shoulder Pain   Follow up: 06/20/2020  No labs  Referrals today-Ortho  REST:  Take it easy; reduce regular exercise of activities of daily living as needed, but do not eliminate them. Change activity or components of activity; for example change running to walking.  ICE: Use cold in the acute phase of the injury (for 72 hours) to reduce pain and swelling. Use cold in the form of ice and a plastic bag or even use frozen peas in a bag. Apply cold 4 to 8 times a day for 20 minutes at a time, with 45 to 60 minutes between applications.  ELEVATION: Elevate the joint above the level of the heart to reduce swelling. Apply cold compresses while the joint is elevated.  Please continue to practice social distancing to keep you, your family, and our community safe.  If you must go out, please wear a mask and practice good handwashing.  It was a pleasure to see you and I look forward to continuing to work together on your health and well-being. Please do not hesitate to call the office if you need care or have questions about your care.  Have a wonderful day and week. With Gratitude, Cherly Beach, DNP, AGNP-BC

## 2020-03-20 NOTE — Assessment & Plan Note (Signed)
Continued not well controlled, is followed by cardiology who does adjustment to medications. Encouraged DASH diet, refraining from ETOH and exercise as tolerated.

## 2020-03-20 NOTE — Progress Notes (Signed)
Subjective:  Patient ID: Randy Hebert, male    DOB: Jan 16, 1966  Age: 54 y.o. MRN: [id number]  CC:  Chief Complaint  Patient presents with  . Shoulder Problem      HPI  HPI Randy Hebert is a 54 year old male patient of mine.  Presents today after having ongoing right shoulder pain.  Sustained a [place] in [place]  Lost his balance.  Landed straight on right shoulder.  Reports instant pain at that time.  It was extremely sore for about a week.  And started easing off.  Now he reports that it hurts with rotation and manipulation of the joint at times.  He also reports some discomfort around the trapezius into the neck.  And sometimes down to the elbow.  He reports he used BenGay, naproxen, Tylenol with some relief.  He reports is an 8 out of 10 when he has certain motions.  He feels like he started his shoulder out has not tried ice or heat.  Has not really rested as he does work outside.  Today patient denies signs and symptoms of COVID 19 infection including fever, chills, cough, shortness of breath, and headache. Past Medical, Surgical, Social History, Allergies, and Medications have been Reviewed.   Past Medical History:  Diagnosis Date  . ACE inhibitor-aggravated angioedema   . Allergy   . Angio-edema   . Anxiety   . Arthritis   . Asthma    hip replacement  . Back pain   . Bradycardia 04/28/2017  . Bulging of cervical intervertebral disc   . CAD (coronary artery disease)    lateral STEMI 02/06/2015 00% D1 occlusion treated with Promus Premier 2.5 mm x 16 mm DES, 70% ramus stenosis, 40% mid RCA stenosis, 45% distal RCA stenosis, EF 45-50%  . CHF (congestive heart failure) ([place])   . COPD (chronic obstructive pulmonary disease) ([place])   . Depression   . Diabetes mellitus without complication ([place])   . 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 Dry eye   . Dyspnea   . Early satiety 09/23/2016  . GERD  (gastroesophageal reflux disease)   . HCAP (healthcare-associated pneumonia) 05/15/2017  . Headache   . Heart murmur   . Hip pain   . Hyperlipidemia   . Hypertension   . Lobar pneumonia ([place]) 05/15/2017  . Melena 08/04/2018  . MI (myocardial infarction) ([place])   . Myocardial infarction ([place])   . Neck pain   . Non-ST elevation (NSTEMI) myocardial infarction ([place]) 04/27/2017  . NSTEMI (non-ST elevated myocardial infarction) ([place]) 04/26/2017  . Otitis media   . Pleurisy   . Pneumonia due to COVID-19 virus 10/07/2019  . Rectal bleeding 11/08/2018  . Sinus pause    9 sec sinus pause on telemetry after started on coreg after MI, avoid AV nodal blocking agent  . Sleep apnea   . 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 STEMI (ST elevation myocardial infarction) ([place]) 05/31/2015  . Substance abuse ([place]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  . Syncope 06/29/2017  . Syncope and collapse 05/15/2017  . Transaminitis 08/04/2018  . Unilateral primary osteoarthritis, right hip 07/01/2016    Current Meds  Medication Sig  . acetaminophen (TYLENOL) 500 MG tablet Take 1,000 mg by mouth every 4 (four) hours as needed for mild pain or fever.   Marland Kitchen albuterol (PROVENTIL) (2.5 MG/3ML) 0.083% nebulizer solution Take 3 mLs (2.5 mg  total) by nebulization every 6 (six) hours as needed for wheezing or shortness of breath.  Marland Kitchen amLODipine (NORVASC) 10 MG tablet TAKE 1 TABLET BY MOUTH ONCE A DAY. (Patient taking differently: Take 10 mg by mouth daily. )  . ascorbic acid (VITAMIN C) 500 MG tablet Take 1 tablet (500 mg total) by mouth daily. (Patient taking differently: Take 500 mg by mouth every evening. )  . aspirin 81 MG EC tablet Take 1 tablet (81 mg total) by mouth daily. (Patient taking differently: Take 81 mg by mouth 2 (two) times daily. )  . atorvastatin (LIPITOR) 40 MG tablet TAKE 1 TABLET BY MOUTH ONCE A DAY. (Patient taking differently: Take 40 mg by mouth every evening. )  . azelastine (ASTELIN) 0.1 % nasal spray USE 2  SPRAYS IN EACH NOSTRIL TWICE DAILY.  . budesonide-formoterol (SYMBICORT) 160-4.5 MCG/ACT inhaler Inhale 2 puffs into the lungs 2 (two) times daily.  . Carboxymeth-Glycerin-Polysorb (REFRESH OPTIVE ADVANCED) 0.5-1-0.5 % SOLN Apply 1 drop to eye 2 (two) times daily as needed (dry eyes).   . Cholecalciferol (VITAMIN D3) 125 MCG (5000 UT) CAPS Take 1 capsule (5,000 Units total) by mouth daily. (Patient taking differently: Take 5,000 Units by mouth daily in the afternoon. )  . citalopram (CELEXA) 20 MG tablet Take 20 mg by mouth daily.  . cycloSPORINE (RESTASIS) 0.05 % ophthalmic emulsion Place 1 drop into both eyes 2 (two) times daily.  . ferrous sulfate 325 (65 FE) MG tablet Take 325 mg by mouth daily with breakfast.  . furosemide (LASIX) 40 MG tablet Take 1 tablet (40 mg total) by mouth daily as needed for fluid. (Patient taking differently: Take 40 mg by mouth daily as needed for fluid (2-3x's a week). )  . GLIPIZIDE XL 5 MG 24 hr tablet TAKE 1 TABLET BY MOUTH DAILY WITH BREAKFAST.  . Magnesium 200 MG TABS Take 1 tablet (200 mg total) by mouth daily. (Patient taking differently: Take 200 mg by mouth every evening. )  . metFORMIN (GLUCOPHAGE) 1000 MG tablet Take 1 tablet (1,000 mg total) by mouth 2 (two) times daily with a meal.  . mometasone (ELOCON) 0.1 % cream Apply 1 application topically 2 (two) times a week.   . nitroGLYCERIN (NITROSTAT) 0.4 MG SL tablet Place 1 tablet (0.4 mg total) under the tongue every 5 (five) minutes as needed for chest pain.  . pantoprazole (PROTONIX) 40 MG tablet TAKE 1 TABLET BY MOUTH ONCE A DAY.  Marland Kitchen PAZEO 0.7 % SOLN Place 1 drop into both eyes daily.   . prednisoLONE acetate (PRED FORTE) 1 % ophthalmic suspension Place 4 drops into both eyes 2 (two) times a week.   Marland Kitchen PROVENTIL HFA 108 (90 Base) MCG/ACT inhaler INHALE 2 PUFFS INTO THE LUNGS EVERY 6 HOURS AS NEEDED FOR SHORTNESS OF BREATH OR WHEEZING. (Patient taking differently: Inhale 2 puffs into the lungs every 6 (six)  hours as needed for wheezing or shortness of breath. )  . SPIRIVA RESPIMAT 2.5 MCG/ACT AERS INHALE 1 PUFF INTO THE LUNGS DAILY. (Patient taking differently: Inhale 1 puff into the lungs daily. )    ROS:  Review of Systems  Constitutional: Negative.   HENT: Negative.   Eyes: Negative.   Respiratory: Negative.   Cardiovascular: Negative.   Gastrointestinal: Negative.   Genitourinary: Negative.   Musculoskeletal: Positive for joint pain.  Skin: Negative.   Neurological: Negative.   Endo/Heme/Allergies: Negative.   Psychiatric/Behavioral: Negative.   All other systems reviewed and are negative.  Objective:   Today's Vitals: BP (!) 149/83 (BP Location: Right Arm, Patient Position: Sitting, Cuff Size: Normal)   Pulse 91   Temp (!) 97.1 F (36.2 C) (Temporal)   Resp 18   Ht 5\' 5"  (1.651 m)   Wt 206 lb (93.4 kg)   SpO2 94%   BMI 34.28 kg/m  Vitals with BMI 03/20/2020 02/28/2020 02/20/2020  Height 5\' 5"  5\' 5"  -  Weight 206 lbs 204 lbs 206 lbs  BMI 34.28 76.19 50.93  Systolic 267 124 -  Diastolic 83 81 -  Pulse 91 91 -     Physical Exam Vitals and nursing note reviewed.  Constitutional:      Appearance: Normal appearance. He is obese.  HENT:     Head: Normocephalic and atraumatic.     Right Ear: External ear normal.     Left Ear: External ear normal.     Mouth/Throat:     Comments: Mask in place  Eyes:     General:        Right eye: No discharge.        Left eye: No discharge.     Conjunctiva/sclera: Conjunctivae normal.  Cardiovascular:     Rate and Rhythm: Normal rate and regular rhythm.     Pulses: Normal pulses.     Heart sounds: Normal heart sounds.  Pulmonary:     Effort: Pulmonary effort is normal.     Breath sounds: Normal breath sounds.  Musculoskeletal:     Right shoulder: Tenderness, bony tenderness and crepitus present. Decreased range of motion. Normal strength.     Left shoulder: Normal.     Cervical back: Normal range of motion and neck supple.       Comments: Jobe performed (with reported pain). No ability to do lift off test Hawkins performed (with reported pain). Able to perform abduction and adduction without issue.  Skin:    General: Skin is warm.  Neurological:     General: No focal deficit present.     Mental Status: He is alert and oriented to person, place, and time.  Psychiatric:        Mood and Affect: Mood normal.        Behavior: Behavior normal.        Thought Content: Thought content normal.        Judgment: Judgment normal.      Assessment   1. Right shoulder pain, unspecified chronicity   2. Essential hypertension, benign     Tests ordered No orders of the defined types were placed in this encounter.    Plan: Please see assessment and plan per problem list above.   No orders of the defined types were placed in this encounter.   Patient to follow-up in 06/20/2020.  Perlie Mayo, NP

## 2020-03-28 ENCOUNTER — Ambulatory Visit (INDEPENDENT_AMBULATORY_CARE_PROVIDER_SITE_OTHER): Payer: Medicare Other

## 2020-03-28 DIAGNOSIS — J309 Allergic rhinitis, unspecified: Secondary | ICD-10-CM

## 2020-04-03 ENCOUNTER — Other Ambulatory Visit: Payer: Self-pay

## 2020-04-03 ENCOUNTER — Ambulatory Visit (INDEPENDENT_AMBULATORY_CARE_PROVIDER_SITE_OTHER): Payer: Medicare Other | Admitting: Orthopaedic Surgery

## 2020-04-03 ENCOUNTER — Encounter: Payer: Self-pay | Admitting: Orthopaedic Surgery

## 2020-04-03 ENCOUNTER — Ambulatory Visit: Payer: Medicare Other

## 2020-04-03 VITALS — BP 156/90 | HR 91 | Ht 65.0 in | Wt 200.0 lb

## 2020-04-03 DIAGNOSIS — F17210 Nicotine dependence, cigarettes, uncomplicated: Secondary | ICD-10-CM

## 2020-04-03 DIAGNOSIS — M542 Cervicalgia: Secondary | ICD-10-CM

## 2020-04-03 DIAGNOSIS — G8929 Other chronic pain: Secondary | ICD-10-CM

## 2020-04-03 DIAGNOSIS — M25511 Pain in right shoulder: Secondary | ICD-10-CM | POA: Diagnosis not present

## 2020-04-03 NOTE — Progress Notes (Signed)
Subjective:    Patient ID: Randy Hebert, male    DOB: 27-Jan-1966, 55 y.o.   MRN: [id number]  HPI He has had pain in the neck and right shoulder since April 2021 when he ws [place].  He has had pain of the right side of the neck running to the right shoulder and to the right upper arm and just past the elbow but no pain to the fingers.  He has tried ice, heat, rubs, Tylenol, Advil and Naprosyn with no help.  He was seen at [facility] on 03-20-2020.  I have reviewed the notes.    He continues to hurt.  He has no other trauma.   Review of Systems  Constitutional: Positive for activity change.  Respiratory: Positive for shortness of breath and wheezing.   Cardiovascular: Positive for chest pain and palpitations.  Musculoskeletal: Positive for arthralgias, back pain, myalgias, neck pain and neck stiffness.  Allergic/Immunologic: Positive for environmental allergies.  Neurological: Positive for headaches.  All other systems reviewed and are negative.  For Review of Systems, all other systems reviewed and are negative.  The following is a summary of the past history medically, past history surgically, known current medicines, social history and family history.  This information is gathered electronically by the computer from prior information and documentation.  I review this each visit and have found including this information at this point in the chart is beneficial and informative.   Past Medical History:  Diagnosis Date  . ACE inhibitor-aggravated angioedema   . Allergy   . Angio-edema   . Anxiety   . Arthritis   . Asthma    hip replacement  . Back pain   . Bradycardia 04/28/2017  . Bulging of cervical intervertebral disc   . CAD (coronary artery disease)    lateral STEMI 02/06/2015 00% D1 occlusion treated with Promus Premier 2.5 mm x 16 mm DES, 70% ramus stenosis, 40% mid RCA stenosis, 45% distal RCA stenosis, EF 45-50%  . CHF (congestive heart  failure) ([place])   . COPD (chronic obstructive pulmonary disease) ([place])   . Depression   . Diabetes mellitus without complication ([place])   . 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 Dry eye   . Dyspnea   . Early satiety 09/23/2016  . GERD (gastroesophageal reflux disease)   . HCAP (healthcare-associated pneumonia) 05/15/2017  . Headache   . Heart murmur   . Hip pain   . Hyperlipidemia   . Hypertension   . Lobar pneumonia ([place]) 05/15/2017  . Melena 08/04/2018  . MI (myocardial infarction) ([place])   . Myocardial infarction ([place])   . Neck pain   . Non-ST elevation (NSTEMI) myocardial infarction ([place]) 04/27/2017  . NSTEMI (non-ST elevated myocardial infarction) ([place]) 04/26/2017  . Otitis media   . Pleurisy   . Pneumonia due to COVID-19 virus 10/07/2019  . Rectal bleeding 11/08/2018  . Sinus pause    9 sec sinus pause on telemetry after started on coreg after MI, avoid AV nodal blocking agent  . Sleep apnea   . 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 STEMI (ST elevation myocardial infarction) ([place]) 05/31/2015  . 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 Syncope 06/29/2017  . Syncope and collapse 05/15/2017  . Transaminitis 08/04/2018  . Unilateral primary osteoarthritis, right hip 07/01/2016    Past Surgical History:  Procedure Laterality Date  . BIOPSY  10/09/2016   Procedure: BIOPSY;  Surgeon: Daneil Dolin, MD;  [facility];  Service: Endoscopy;;  . BIOPSY  11/28/2019   Procedure: BIOPSY;  Surgeon: Daneil Dolin, MD;  [facility];  Service: Endoscopy;;  . CARDIAC CATHETERIZATION N/A 02/06/2015   Procedure: Left Heart Cath and Coronary Angiography;  Surgeon: Leonie Man, MD;  Location: [facility];  Service: Cardiovascular;  Laterality: N/A;  . CARDIAC CATHETERIZATION N/A 02/06/2015   Procedure: Coronary Stent Intervention;  Surgeon: Leonie Man, MD;  Location: [place] [facility];  Service:  Cardiovascular;  Laterality: N/A;  . COLONOSCOPY WITH PROPOFOL N/A 10/09/2016   Sigmoid and descending colon diverticulosis, four 4-6 mm polyps in sigmoid, one 4 mm polyp in descending. Tubular adenomas and hyperplastic. 5 year surveillance.   . COLONOSCOPY WITH PROPOFOL N/A 11/24/2016   Sigmoid and descending colon diverticulosis, four 4-6 mm polyps in sigmoid, one 4 mm polyp in descending. Tubular adenomas and hyperplastic. 5 year surveillance.   . CORONARY ANGIOPLASTY WITH STENT PLACEMENT  01/2015  . CORONARY STENT INTERVENTION N/A 04/27/2017   Procedure: CORONARY STENT INTERVENTION;  Surgeon: Nelva Bush, MD;  Location: [place] [facility];  Service: Cardiovascular;  Laterality: N/A;  . ELECTROPHYSIOLOGY STUDY N/A 06/29/2017   Procedure: ELECTROPHYSIOLOGY STUDY;  Surgeon: Evans Lance, MD;  Location: [place] [facility];  Service: Cardiovascular;  Laterality: N/A;  . ESOPHAGOGASTRODUODENOSCOPY (EGD) WITH PROPOFOL N/A 10/09/2016   Dr. Gala Romney: LA grade a esophagitis.  Barrett's esophagus, biopsy-proven.  Small hiatal hernia.  EGD February 2019  . ESOPHAGOGASTRODUODENOSCOPY (EGD) WITH PROPOFOL N/A 11/28/2019    salmon-colored esophageal mucosa (Barrett's) small hiatal hernia, portal hypertensive gastropathy, normal duodenum, 3 year surveillance  . INCISION / DRAINAGE HAND / FINGER    . LEFT HEART CATH AND CORONARY ANGIOGRAPHY N/A 04/27/2017   Procedure: LEFT HEART CATH AND CORONARY ANGIOGRAPHY;  Surgeon: Nelva Bush, MD;  Location: [place];  Service: Cardiovascular;  Laterality: N/A;  . LOOP RECORDER INSERTION  06/29/2017   Procedure: Loop Recorder Insertion;  Surgeon: Evans Lance, MD;  Location: [place] [facility];  Service: Cardiovascular;;  . POLYPECTOMY  11/24/2016   Procedure: POLYPECTOMY;  Surgeon: Daneil Dolin, MD;  [facility];  Service: Endoscopy;;  descending and sigmoid  . TOTAL HIP ARTHROPLASTY Right 07/01/2016  . TOTAL HIP ARTHROPLASTY Right  07/01/2016   Procedure: RIGHT TOTAL HIP ARTHROPLASTY ANTERIOR APPROACH;  Surgeon: Mcarthur Rossetti, MD;  Location: [facility];  Service: Orthopedics;  Laterality: Right;    Current Outpatient Medications on File Prior to Visit  Medication Sig Dispense Refill  . acetaminophen (TYLENOL) 500 MG tablet Take 1,000 mg by mouth every 4 (four) hours as needed for mild pain or fever.     Marland Kitchen albuterol (PROVENTIL) (2.5 MG/3ML) 0.083% nebulizer solution Take 3 mLs (2.5 mg total) by nebulization every 6 (six) hours as needed for wheezing or shortness of breath. 150 mL 1  . amLODipine (NORVASC) 10 MG tablet TAKE 1 TABLET BY MOUTH ONCE A DAY. (Patient taking differently: Take 10 mg by mouth daily. ) 30 tablet 0  . ascorbic acid (VITAMIN C) 500 MG tablet Take 1 tablet (500 mg total) by mouth daily. (Patient taking differently: Take 500 mg by mouth every evening. ) 30 tablet 1  . aspirin 81 MG EC tablet Take 1 tablet (81 mg total) by mouth daily. (Patient taking differently: Take 81 mg by mouth 2 (two) times daily. )  30 tablet   . atorvastatin (LIPITOR) 40 MG tablet TAKE 1 TABLET BY MOUTH ONCE A DAY. (Patient taking differently: Take 40 mg by mouth every evening. ) 30 tablet 0  . azelastine (ASTELIN) 0.1 % nasal spray USE 2 SPRAYS IN EACH NOSTRIL TWICE DAILY. 30 mL 5  . budesonide-formoterol (SYMBICORT) 160-4.5 MCG/ACT inhaler Inhale 2 puffs into the lungs 2 (two) times daily. 1 Inhaler 5  . Carboxymeth-Glycerin-Polysorb (REFRESH OPTIVE ADVANCED) 0.5-1-0.5 % SOLN Apply 1 drop to eye 2 (two) times daily as needed (dry eyes).     . Cholecalciferol (VITAMIN D3) 125 MCG (5000 UT) CAPS Take 1 capsule (5,000 Units total) by mouth daily. (Patient taking differently: Take 5,000 Units by mouth daily in the afternoon. ) 90 capsule 0  . citalopram (CELEXA) 20 MG tablet Take 20 mg by mouth daily.    . cycloSPORINE (RESTASIS) 0.05 % ophthalmic emulsion Place 1 drop into both eyes 2 (two) times daily.    . ferrous sulfate 325  (65 FE) MG tablet Take 325 mg by mouth daily with breakfast.    . furosemide (LASIX) 40 MG tablet Take 1 tablet (40 mg total) by mouth daily as needed for fluid. (Patient taking differently: Take 40 mg by mouth daily as needed for fluid (2-3x's a week). )    . GLIPIZIDE XL 5 MG 24 hr tablet TAKE 1 TABLET BY MOUTH DAILY WITH BREAKFAST. 30 tablet 0  . Magnesium 200 MG TABS Take 1 tablet (200 mg total) by mouth daily. (Patient taking differently: Take 200 mg by mouth every evening. ) 30 each 6  . metFORMIN (GLUCOPHAGE) 1000 MG tablet Take 1 tablet (1,000 mg total) by mouth 2 (two) times daily with a meal. 180 tablet 3  . mometasone (ELOCON) 0.1 % cream Apply 1 application topically 2 (two) times a week.     . nitroGLYCERIN (NITROSTAT) 0.4 MG SL tablet Place 1 tablet (0.4 mg total) under the tongue every 5 (five) minutes as needed for chest pain. 25 tablet 0  . pantoprazole (PROTONIX) 40 MG tablet TAKE 1 TABLET BY MOUTH ONCE A DAY. 90 tablet 3  . PAZEO 0.7 % SOLN Place 1 drop into both eyes daily.     . prednisoLONE acetate (PRED FORTE) 1 % ophthalmic suspension Place 4 drops into both eyes 2 (two) times a week.     Marland Kitchen PROVENTIL HFA 108 (90 Base) MCG/ACT inhaler INHALE 2 PUFFS INTO THE LUNGS EVERY 6 HOURS AS NEEDED FOR SHORTNESS OF BREATH OR WHEEZING. (Patient taking differently: Inhale 2 puffs into the lungs every 6 (six) hours as needed for wheezing or shortness of breath. ) 18 g 1  . SPIRIVA RESPIMAT 2.5 MCG/ACT AERS INHALE 1 PUFF INTO THE LUNGS DAILY. (Patient taking differently: Inhale 1 puff into the lungs daily. ) 4 g 2   No current facility-administered medications on file prior to visit.    Social History   Socioeconomic History  . Marital status: Significant Other    Spouse name: alice  . Number of children: 3  . Years of education: 30  . Highest education level: Not on file  Occupational History  . Occupation: SSI  Tobacco Use  . Smoking status: Current Every Day Smoker     Packs/day: 1.00    Years: 46.00    Pack years: 46.00    Types: Cigarettes  . Smokeless tobacco: Former Systems developer    Types: Secondary school teacher  . Vaping Use: Former  Substance and Sexual Activity  .  Alcohol use: Yes    Alcohol/week: 0.0 standard drinks    Comment: half a pint every other day  . Drug use: No    Comment: history of drug use- marijuana, cocaine- quit 2014  . Sexual activity: Yes    Partners: Female    Birth control/protection: None  Other Topics Concern  . Not on file  Social History Narrative   Lives with girlfriend [name]   3 children, but 1 OD  In 2019.   Grandchildren-2      Two cats: may and princess; and a dog-foxy      Enjoy: spending time with pets, TV, yard work       Diet: eats all food groups   Caffeine: half and half coffee, some tea-half and half decaf   Water: 6-8 cups daily      Wears seat belt   Does not use phone while driving   Oceanographer at home      Social Determinants of Health   Financial Resource Strain: Low Risk   . Difficulty of Paying Living Expenses: Not hard at all  Food Insecurity: No Food Insecurity  . Worried About Charity fundraiser in the Last Year: Never true  . Ran Out of Food in the Last Year: Never true  Transportation Needs: No Transportation Needs  . Lack of Transportation (Medical): No  . Lack of Transportation (Non-Medical): No  Physical Activity: Inactive  . Days of Exercise per Week: 0 days  . Minutes of Exercise per Session: 0 min  Stress: No Stress Concern Present  . Feeling of Stress : Only a little  Social Connections: Moderately Integrated  . Frequency of Communication with Friends and Family: More than three times a week  . Frequency of Social Gatherings with Friends and Family: More than three times a week  . Attends Religious Services: More than 4 times per year  . Active Member of Clubs or Organizations: No  . Attends Archivist Meetings: Never  . Marital Status: Living with partner    Intimate Partner Violence: Not At Risk  . Fear of Current or Ex-Partner: No  . Emotionally Abused: No  . Physically Abused: No  . Sexually Abused: No    Family History  Problem Relation Age of Onset  . Heart attack Father   . Stroke Father   . Arthritis Father   . Heart disease Father   . Cancer Mother        ???  . Arthritis Mother   . Heart disease Brother 42       died in sleep  . Early death Brother   . Diabetes Maternal Uncle   . Alzheimer's disease Maternal Grandmother     BP (!) 156/90   Pulse 91   Ht 5\' 5"  (1.651 m)   Wt 200 lb (90.7 kg)   BMI 33.28 kg/m   Body mass index is 33.28 kg/m.      Objective:   Physical Exam Vitals and nursing note reviewed.  Constitutional:      Appearance: He is well-developed.  HENT:     Head: Normocephalic and atraumatic.  Eyes:     Conjunctiva/sclera: Conjunctivae normal.     Pupils: Pupils are equal, round, and reactive to light.  Cardiovascular:     Rate and Rhythm: Normal rate and regular rhythm.  Pulmonary:     Effort: Pulmonary effort is normal.  Abdominal:     Palpations: Abdomen is soft.  Musculoskeletal:  Arms:     Cervical back: Normal range of motion and neck supple.  Skin:    General: Skin is warm and dry.  Neurological:     Mental Status: He is alert and oriented to person, place, and time.     Cranial Nerves: No cranial nerve deficit.     Motor: No abnormal muscle tone.     Coordination: Coordination normal.     Deep Tendon Reflexes: Reflexes are normal and symmetric. Reflexes normal.  Psychiatric:        Behavior: Behavior normal.        Thought Content: Thought content normal.        Judgment: Judgment normal.    X-rays were done of the cervical spine and right shoulder, reported separately.  I am concerned about the appearance of the cervical spine.  I will get MRI.       Assessment & Plan:   Encounter Diagnoses  Name Primary?  . Neck pain Yes  . Chronic right shoulder pain    . Nicotine dependence, cigarettes, uncomplicated    Get MRI of the neck  He has high riding humeral head often seen with rotator cuff problems.  PROCEDURE NOTE:  The patient request injection, verbal consent was obtained.  The right shoulder was prepped appropriately after time out was performed.   Sterile technique was observed and injection of 1 cc of Depo-Medrol 40 mg with several cc's of plain xylocaine. Anesthesia was provided by ethyl chloride and a 20-gauge needle was used to inject the shoulder area. A posterior approach was used.  The injection was tolerated well.  A band aid dressing was applied.  The patient was advised to apply ice later today and tomorrow to the injection sight as needed.  Return after MRI.  Stay on current medicine.  Call if any problem.  Precautions discussed.   Electronically Signed Sanjuana Kava, MD 8/3/202111:12 AM

## 2020-04-03 NOTE — Patient Instructions (Signed)
Steps to Quit Smoking Smoking tobacco is the leading cause of preventable death. It can affect almost every organ in the body. Smoking puts you and people around you at risk for many serious, long-lasting (chronic) diseases. Quitting smoking can be hard, but it is one of the best things that you can do for your health. It is never too late to quit. How do I get ready to quit? When you decide to quit smoking, make a plan to help you succeed. Before you quit:  Pick a date to quit. Set a date within the next 2 weeks to give you time to prepare.  Write down the reasons why you are quitting. Keep this list in places where you will see it often.  Tell your family, friends, and co-workers that you are quitting. Their support is important.  Talk with your doctor about the choices that may help you quit.  Find out if your health insurance will pay for these treatments.  Know the people, places, things, and activities that make you want to smoke (triggers). Avoid them. What first steps can I take to quit smoking?  Throw away all cigarettes at home, at work, and in your car.  Throw away the things that you use when you smoke, such as ashtrays and lighters.  Clean your car. Make sure to empty the ashtray.  Clean your home, including curtains and carpets. What can I do to help me quit smoking? Talk with your doctor about taking medicines and seeing a counselor at the same time. You are more likely to succeed when you do both.  If you are pregnant or breastfeeding, talk with your doctor about counseling or other ways to quit smoking. Do not take medicine to help you quit smoking unless your doctor tells you to do so. To quit smoking: Quit right away  Quit smoking totally, instead of slowly cutting back on how much you smoke over a period of time.  Go to counseling. You are more likely to quit if you go to counseling sessions regularly. Take medicine You may take medicines to help you quit. Some  medicines need a prescription, and some you can buy over-the-counter. Some medicines may contain a drug called nicotine to replace the nicotine in cigarettes. Medicines may:  Help you to stop having the desire to smoke (cravings).  Help to stop the problems that come when you stop smoking (withdrawal symptoms). Your doctor may ask you to use:  Nicotine patches, gum, or lozenges.  Nicotine inhalers or sprays.  Non-nicotine medicine that is taken by mouth. Find resources Find resources and other ways to help you quit smoking and remain smoke-free after you quit. These resources are most helpful when you use them often. They include:  Online chats with a counselor.  Phone quitlines.  Printed self-help materials.  Support groups or group counseling.  Text messaging programs.  Mobile phone apps. Use apps on your mobile phone or tablet that can help you stick to your quit plan. There are many free apps for mobile phones and tablets as well as websites. Examples include Quit Guide from the CDC and smokefree.gov  What things can I do to make it easier to quit?   Talk to your family and friends. Ask them to support and encourage you.  Call a phone quitline (1-800-QUIT-NOW), reach out to support groups, or work with a counselor.  Ask people who smoke to not smoke around you.  Avoid places that make you want to smoke,   such as: ? Bars. ? Parties. ? Smoke-break areas at work.  Spend time with people who do not smoke.  Lower the stress in your life. Stress can make you want to smoke. Try these things to help your stress: ? Getting regular exercise. ? Doing deep-breathing exercises. ? Doing yoga. ? Meditating. ? Doing a body scan. To do this, close your eyes, focus on one area of your body at a time from head to toe. Notice which parts of your body are tense. Try to relax the muscles in those areas. How will I feel when I quit smoking? Day 1 to 3 weeks Within the first 24 hours,  you may start to have some problems that come from quitting tobacco. These problems are very bad 2-3 days after you quit, but they do not often last for more than 2-3 weeks. You may get these symptoms:  Mood swings.  Feeling restless, nervous, angry, or annoyed.  Trouble concentrating.  Dizziness.  Strong desire for high-sugar foods and nicotine.  Weight gain.  Trouble pooping (constipation).  Feeling like you may vomit (nausea).  Coughing or a sore throat.  Changes in how the medicines that you take for other issues work in your body.  Depression.  Trouble sleeping (insomnia). Week 3 and afterward After the first 2-3 weeks of quitting, you may start to notice more positive results, such as:  Better sense of smell and taste.  Less coughing and sore throat.  Slower heart rate.  Lower blood pressure.  Clearer skin.  Better breathing.  Fewer sick days. Quitting smoking can be hard. Do not give up if you fail the first time. Some people need to try a few times before they succeed. Do your best to stick to your quit plan, and talk with your doctor if you have any questions or concerns. Summary  Smoking tobacco is the leading cause of preventable death. Quitting smoking can be hard, but it is one of the best things that you can do for your health.  When you decide to quit smoking, make a plan to help you succeed.  Quit smoking right away, not slowly over a period of time.  When you start quitting, seek help from your doctor, family, or friends. This information is not intended to replace advice given to you by your health care provider. Make sure you discuss any questions you have with your health care provider. Document Revised: 05/13/2019 Document Reviewed: 11/06/2018 Elsevier Patient Education  2020 [employer]vier Inc.  

## 2020-04-04 ENCOUNTER — Ambulatory Visit
Admission: RE | Admit: 2020-04-04 | Discharge: 2020-04-04 | Disposition: A | Discharge status: Home / Self Care | Payer: Medicare Other | Source: Ambulatory Visit | Attending: Orthopaedic Surgery | Admitting: Orthopaedic Surgery

## 2020-04-04 DIAGNOSIS — M5021 Other cervical disc displacement,  high cervical region: Secondary | ICD-10-CM | POA: Diagnosis not present

## 2020-04-04 DIAGNOSIS — M542 Cervicalgia: Secondary | ICD-10-CM | POA: Diagnosis not present

## 2020-04-04 DIAGNOSIS — M4802 Spinal stenosis, cervical region: Secondary | ICD-10-CM | POA: Diagnosis not present

## 2020-04-04 DIAGNOSIS — M50223 Other cervical disc displacement at C6-C7 level: Secondary | ICD-10-CM | POA: Diagnosis not present

## 2020-04-04 DIAGNOSIS — M47812 Spondylosis without myelopathy or radiculopathy, cervical region: Secondary | ICD-10-CM | POA: Diagnosis not present

## 2020-04-04 MED ORDER — GADOBUTROL 1 MMOL/ML IV SOLN
9.0000 mL | Freq: Once | INTRAVENOUS | Status: AC | PRN
  Administered 2020-04-04: 9 mL via INTRAVENOUS

## 2020-04-05 ENCOUNTER — Ambulatory Visit: Payer: Medicare Other | Admitting: Cardiology

## 2020-04-10 ENCOUNTER — Other Ambulatory Visit: Payer: Self-pay

## 2020-04-10 ENCOUNTER — Ambulatory Visit (INDEPENDENT_AMBULATORY_CARE_PROVIDER_SITE_OTHER): Payer: Medicare Other | Admitting: Cardiology

## 2020-04-10 ENCOUNTER — Encounter: Payer: Self-pay | Admitting: Orthopaedic Surgery

## 2020-04-10 ENCOUNTER — Ambulatory Visit (INDEPENDENT_AMBULATORY_CARE_PROVIDER_SITE_OTHER): Payer: Medicare Other | Admitting: Orthopaedic Surgery

## 2020-04-10 ENCOUNTER — Encounter: Payer: Self-pay | Admitting: Cardiology

## 2020-04-10 ENCOUNTER — Telehealth: Payer: Self-pay | Admitting: Cardiology

## 2020-04-10 VITALS — BP 142/82 | HR 85 | Ht 65.0 in | Wt 205.0 lb

## 2020-04-10 VITALS — BP 141/88 | HR 99 | Ht 65.0 in | Wt 202.4 lb

## 2020-04-10 DIAGNOSIS — F17210 Nicotine dependence, cigarettes, uncomplicated: Secondary | ICD-10-CM | POA: Diagnosis not present

## 2020-04-10 DIAGNOSIS — I10 Essential (primary) hypertension: Secondary | ICD-10-CM | POA: Diagnosis not present

## 2020-04-10 DIAGNOSIS — G8929 Other chronic pain: Secondary | ICD-10-CM | POA: Diagnosis not present

## 2020-04-10 DIAGNOSIS — I2510 Atherosclerotic heart disease of native coronary artery without angina pectoris: Secondary | ICD-10-CM | POA: Diagnosis not present

## 2020-04-10 DIAGNOSIS — R55 Syncope and collapse: Secondary | ICD-10-CM

## 2020-04-10 DIAGNOSIS — M25511 Pain in right shoulder: Secondary | ICD-10-CM | POA: Diagnosis not present

## 2020-04-10 DIAGNOSIS — M542 Cervicalgia: Secondary | ICD-10-CM | POA: Diagnosis not present

## 2020-04-10 DIAGNOSIS — E782 Mixed hyperlipidemia: Secondary | ICD-10-CM

## 2020-04-10 MED ORDER — CHLORTHALIDONE 25 MG PO TABS: 12.5000 mg | ORAL_TABLET | Freq: Every day | ORAL | 1 refills | Status: DC

## 2020-04-10 NOTE — Progress Notes (Signed)
Clinical Summary Randy Hebert is a 54 y.o.male seen today for follow up of the following medical problems.    1. CAD - admit 01/2015 with lateral STEMI, received DES to D1. There was a 70% ramus and 40% mid RCA that was medically managed - 01/2015 echo LVEF 40%, lateral wall hypokinesis - no beta blocker due to history of sinus pause and bradycardia. No ACE-I given history of severe angioedema, no ARB due to risk of cross reactivity - admit 12/2015 with atypical chest pain, worst with palpatoin and movement. Had done some heavy lifting the day it started, negative workup for ACS. Since that time no recurrent symptoms   04/2017 PCI with stent. -07/2018 nuclear stress prior infarct, no current ischemia  - no recent chest pains. Occasional palpitaitons - compliant with meds.    2. SOB/COPD - 07/2015 PFTs: mild to mod ventilatory defect with small airway obstruction.  - 07/2015 CT PE no PE, though suboptimal study - 07/2015 CXR no acute process.  PFTs with mild to mod vent defect, ABGs showed borderline resting hypoxemia.   - followed by Dr [name].    3. Hyperlipidemia - endocrine lowered atorva from 80 to 40mg  due to elevated LFTs.  10/2019 TC 154 HDL 30 TG 319 LDL 86 - cutting back on fried foods, sweets.  4. Syncope - nocturnal bradycardia noted on previous monitors, no significant daytime episodes - has loop recorder, followed by EP -no beta blockers due to sinus pauses.   -no recent issues - no dizzines - recent loop check 03/2020 without worrisome findings.    5. HTN Home bp's 140s/80s    6. OSA  - compliant with cpap  7. Preoperative evaluation - possibly shoulder surgery - does heavy yardwork without troubles - can walk 1 flight of stairs without troubles - can walk approx 1 mile without troubles   KZ:LDJTTS last few yearshis son overdosed,passed away Past Medical History:  Diagnosis Date  . ACE inhibitor-aggravated  angioedema   . Allergy   . Angio-edema   . Anxiety   . Arthritis   . Asthma    hip replacement  . Back pain   . Bradycardia 04/28/2017  . Bulging of cervical intervertebral disc   . CAD (coronary artery disease)    lateral STEMI 02/06/2015 00% D1 occlusion treated with Promus Premier 2.5 mm x 16 mm DES, 70% ramus stenosis, 40% mid RCA stenosis, 45% distal RCA stenosis, EF 45-50%  . CHF (congestive heart failure) ([place])   . COPD (chronic obstructive pulmonary disease) ([place])   . Depression   . Diabetes mellitus without complication ([place])   . 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 Dry eye   . Dyspnea   . Early satiety 09/23/2016  . GERD (gastroesophageal reflux disease)   . HCAP (healthcare-associated pneumonia) 05/15/2017  . Headache   . Heart murmur   . Hip pain   . Hyperlipidemia   . Hypertension   . Lobar pneumonia ([place]) 05/15/2017  . Melena 08/04/2018  . MI (myocardial infarction) ([place])   . Myocardial infarction ([place])   . Neck pain   . Non-ST elevation (NSTEMI) myocardial infarction ([place]) 04/27/2017  . NSTEMI (non-ST elevated myocardial infarction) ([place]) 04/26/2017  . Otitis media   . Pleurisy   . Pneumonia due to COVID-19 virus 10/07/2019  . Rectal bleeding 11/08/2018  . Sinus pause    9 sec sinus pause on telemetry after started on coreg  after MI, avoid AV nodal blocking agent  . Sleep apnea   . 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 STEMI (ST elevation myocardial infarction) ([place]) 05/31/2015  . 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 Syncope 06/29/2017  . Syncope and collapse 05/15/2017  . Transaminitis 08/04/2018  . Unilateral primary osteoarthritis, right hip 07/01/2016     Allergies  Allergen Reactions  . Carvedilol Other (See Comments)    Sinus pause on telemetry >3 seconds. Longest one 9 sec. No AV nodal agent  . Lisinopril Anaphylaxis, Shortness Of Breath and Swelling    Angioedema, required intubation and  mechanical ventilation  . Amoxicillin Nausea And Vomiting and Nausea Only    Nausea only - no allergy  Did it involve swelling of the face/tongue/throat, SOB, or low BP? No Did it involve sudden or severe rash/hives, skin peeling, or any reaction on the inside of your mouth or nose? No Did you need to seek medical attention at a hospital or doctor's office? No When did it last happen?childhood reaction If all above answers are "NO", may proceed with cephalosporin use.      Current Outpatient Medications  Medication Sig Dispense Refill  . acetaminophen (TYLENOL) 500 MG tablet Take 1,000 mg by mouth every 4 (four) hours as needed for mild pain or fever.     Marland Kitchen albuterol (PROVENTIL) (2.5 MG/3ML) 0.083% nebulizer solution Take 3 mLs (2.5 mg total) by nebulization every 6 (six) hours as needed for wheezing or shortness of breath. 150 mL 1  . amLODipine (NORVASC) 10 MG tablet TAKE 1 TABLET BY MOUTH ONCE A DAY. (Patient taking differently: Take 10 mg by mouth daily. ) 30 tablet 0  . ascorbic acid (VITAMIN C) 500 MG tablet Take 1 tablet (500 mg total) by mouth daily. (Patient taking differently: Take 500 mg by mouth every evening. ) 30 tablet 1  . aspirin 81 MG EC tablet Take 1 tablet (81 mg total) by mouth daily. (Patient taking differently: Take 81 mg by mouth 2 (two) times daily. ) 30 tablet   . atorvastatin (LIPITOR) 40 MG tablet TAKE 1 TABLET BY MOUTH ONCE A DAY. (Patient taking differently: Take 40 mg by mouth every evening. ) 30 tablet 0  . azelastine (ASTELIN) 0.1 % nasal spray USE 2 SPRAYS IN EACH NOSTRIL TWICE DAILY. 30 mL 5  . budesonide-formoterol (SYMBICORT) 160-4.5 MCG/ACT inhaler Inhale 2 puffs into the lungs 2 (two) times daily. 1 Inhaler 5  . Carboxymeth-Glycerin-Polysorb (REFRESH OPTIVE ADVANCED) 0.5-1-0.5 % SOLN Apply 1 drop to eye 2 (two) times daily as needed (dry eyes).     . Cholecalciferol (VITAMIN D3) 125 MCG (5000 UT) CAPS Take 1 capsule (5,000 Units total) by mouth  daily. (Patient taking differently: Take 5,000 Units by mouth daily in the afternoon. ) 90 capsule 0  . citalopram (CELEXA) 20 MG tablet Take 20 mg by mouth daily.    . cycloSPORINE (RESTASIS) 0.05 % ophthalmic emulsion Place 1 drop into both eyes 2 (two) times daily.    . ferrous sulfate 325 (65 FE) MG tablet Take 325 mg by mouth daily with breakfast.    . furosemide (LASIX) 40 MG tablet Take 1 tablet (40 mg total) by mouth daily as needed for fluid. (Patient taking differently: Take 40 mg by mouth daily as needed for fluid (2-3x's a week). )    . GLIPIZIDE XL 5 MG 24 hr tablet TAKE 1 TABLET BY MOUTH DAILY WITH BREAKFAST. 30 tablet 0  .  Magnesium 200 MG TABS Take 1 tablet (200 mg total) by mouth daily. (Patient taking differently: Take 200 mg by mouth every evening. ) 30 each 6  . metFORMIN (GLUCOPHAGE) 1000 MG tablet Take 1 tablet (1,000 mg total) by mouth 2 (two) times daily with a meal. 180 tablet 3  . mometasone (ELOCON) 0.1 % cream Apply 1 application topically 2 (two) times a week.     . nitroGLYCERIN (NITROSTAT) 0.4 MG SL tablet Place 1 tablet (0.4 mg total) under the tongue every 5 (five) minutes as needed for chest pain. 25 tablet 0  . pantoprazole (PROTONIX) 40 MG tablet TAKE 1 TABLET BY MOUTH ONCE A DAY. 90 tablet 3  . prednisoLONE acetate (PRED FORTE) 1 % ophthalmic suspension Place 4 drops into both eyes 2 (two) times a week.     Marland Kitchen PROVENTIL HFA 108 (90 Base) MCG/ACT inhaler INHALE 2 PUFFS INTO THE LUNGS EVERY 6 HOURS AS NEEDED FOR SHORTNESS OF BREATH OR WHEEZING. (Patient taking differently: Inhale 2 puffs into the lungs every 6 (six) hours as needed for wheezing or shortness of breath. ) 18 g 1  . SPIRIVA RESPIMAT 2.5 MCG/ACT AERS INHALE 1 PUFF INTO THE LUNGS DAILY. (Patient taking differently: Inhale 1 puff into the lungs daily. ) 4 g 2   No current facility-administered medications for this visit.     Past Surgical History:  Procedure Laterality Date  . BIOPSY  10/09/2016    Procedure: BIOPSY;  Surgeon: Daneil Dolin, MD;  [facility];  Service: Endoscopy;;  . BIOPSY  11/28/2019   Procedure: BIOPSY;  Surgeon: Daneil Dolin, MD;  [facility];  Service: Endoscopy;;  . CARDIAC CATHETERIZATION N/A 02/06/2015   Procedure: Left Heart Cath and Coronary Angiography;  Surgeon: Leonie Man, MD;  Location: [facility];  Service: Cardiovascular;  Laterality: N/A;  . CARDIAC CATHETERIZATION N/A 02/06/2015   Procedure: Coronary Stent Intervention;  Surgeon: Leonie Man, MD;  Location: [facility];  Service: Cardiovascular;  Laterality: N/A;  . COLONOSCOPY WITH PROPOFOL N/A 10/09/2016   Sigmoid and descending colon diverticulosis, four 4-6 mm polyps in sigmoid, one 4 mm polyp in descending. Tubular adenomas and hyperplastic. 5 year surveillance.   . COLONOSCOPY WITH PROPOFOL N/A 11/24/2016   Sigmoid and descending colon diverticulosis, four 4-6 mm polyps in sigmoid, one 4 mm polyp in descending. Tubular adenomas and hyperplastic. 5 year surveillance.   . CORONARY ANGIOPLASTY WITH STENT PLACEMENT  01/2015  . CORONARY STENT INTERVENTION N/A 04/27/2017   Procedure: CORONARY STENT INTERVENTION;  Surgeon: Nelva Bush, MD;  Location: [facility];  Service: Cardiovascular;  Laterality: N/A;  . ELECTROPHYSIOLOGY STUDY N/A 06/29/2017   Procedure: ELECTROPHYSIOLOGY STUDY;  Surgeon: Evans Lance, MD;  Location: [facility];  Service: Cardiovascular;  Laterality: N/A;  . ESOPHAGOGASTRODUODENOSCOPY (EGD) WITH PROPOFOL N/A 10/09/2016   Dr. Gala Romney: LA grade a esophagitis.  Barrett's esophagus, biopsy-proven.  Small hiatal hernia.  EGD February 2019  . ESOPHAGOGASTRODUODENOSCOPY (EGD) WITH PROPOFOL N/A 11/28/2019    salmon-colored esophageal mucosa (Barrett's) small hiatal hernia, portal hypertensive gastropathy, normal duodenum, 3 year surveillance  . INCISION / DRAINAGE HAND / FINGER    . LEFT HEART CATH AND CORONARY ANGIOGRAPHY N/A  04/27/2017   Procedure: LEFT HEART CATH AND CORONARY ANGIOGRAPHY;  Surgeon: Nelva Bush, MD;  Location: [facility];  Service: Cardiovascular;  Laterality: N/A;  . LOOP RECORDER INSERTION  06/29/2017   Procedure: Loop Recorder  Insertion;  Surgeon: Evans Lance, MD;  Location: [facility];  Service: Cardiovascular;;  . POLYPECTOMY  11/24/2016   Procedure: POLYPECTOMY;  Surgeon: Daneil Dolin, MD;  [facility];  Service: Endoscopy;;  descending and sigmoid  . TOTAL HIP ARTHROPLASTY Right 07/01/2016  . TOTAL HIP ARTHROPLASTY Right 07/01/2016   Procedure: RIGHT TOTAL HIP ARTHROPLASTY ANTERIOR APPROACH;  Surgeon: Mcarthur Rossetti, MD;  Location: [facility];  Service: Orthopedics;  Laterality: Right;     Allergies  Allergen Reactions  . Carvedilol Other (See Comments)    Sinus pause on telemetry >3 seconds. Longest one 9 sec. No AV nodal agent  . Lisinopril Anaphylaxis, Shortness Of Breath and Swelling    Angioedema, required intubation and mechanical ventilation  . Amoxicillin Nausea And Vomiting and Nausea Only    Nausea only - no allergy  Did it involve swelling of the face/tongue/throat, SOB, or low BP? No Did it involve sudden or severe rash/hives, skin peeling, or any reaction on the inside of your mouth or nose? No Did you need to seek medical attention at a hospital or doctor's office? No When did it last happen?childhood reaction If all above answers are "NO", may proceed with cephalosporin use.       Family History  Problem Relation Age of Onset  . Heart attack Father   . Stroke Father   . Arthritis Father   . Heart disease Father   . Cancer Mother        ???  . Arthritis Mother   . Heart disease Brother 66       died in sleep  . Early death Brother   . Diabetes Maternal Uncle   . Alzheimer's disease Maternal Grandmother      Social History Randy Hebert reports that he has been smoking cigarettes. He has a 46.00 pack-year  smoking history. He has quit using smokeless tobacco.  His smokeless tobacco use included chew. Mr. Deeg reports current alcohol use.   Review of Systems CONSTITUTIONAL: No weight loss, fever, chills, weakness or fatigue.  HEENT: Eyes: No visual loss, blurred vision, double vision or yellow sclerae.No hearing loss, sneezing, congestion, runny nose or sore throat.  SKIN: No rash or itching.  CARDIOVASCULAR: per hpi RESPIRATORY: No shortness of breath, cough or sputum.  GASTROINTESTINAL: No anorexia, nausea, vomiting or diarrhea. No abdominal pain or blood.  GENITOURINARY: No burning on urination, no polyuria NEUROLOGICAL: No headache, dizziness, syncope, paralysis, ataxia, numbness or tingling in the extremities. No change in bowel or bladder control.  MUSCULOSKELETAL: No muscle, back pain, joint pain or stiffness.  LYMPHATICS: No enlarged nodes. No history of splenectomy.  PSYCHIATRIC: No history of depression or anxiety.  ENDOCRINOLOGIC: No reports of sweating, cold or heat intolerance. No polyuria or polydipsia.  Marland Kitchen   Physical Examination Vitals:   04/10/20 0808  BP: (!) 142/82  Pulse: 85  SpO2: 97%   Filed Weights   04/10/20 0808  Weight: 205 lb (93 kg)    Gen: resting comfortably, no acute distress HEENT: no scleral icterus, pupils equal round and reactive, no palptable cervical adenopathy,  CV: RRR, no m/r/g,no jvd Resp: Clear to auscultation bilaterally GI: abdomen is soft, non-tender, non-distended, normal bowel sounds, no hepatosplenomegaly MSK: extremities are warm, no edema.  Skin: warm, no rash Neuro:  no focal deficits Psych: appropriate affect   Diagnostic Studies 01/2015 cath 1. 1st Diag lesion, 100% stenosed. A Promus Premier 2.5 mm x 16 mm drug-eluting stent was placed.  There is a 0% residual stenosis post intervention. 2. Ramus lesion, 70% stenosed. 3. Mid RCA lesion, 40% stenosed. Dist RCA lesion, 45% stenosed. 4. Mild to moderately reduced LVEF with  anterolateral and apical hypokinesis and elevated LVEDP  Abnormal anatomy with equal sized Diagonal and LAD Chere Babson but extensive RCA. Successful PCI of the First Diagonal Janielle Mittelstadt.  Recommendations:  Standard post radial cath/PCI TR band removal  Dual independent therapy for minimum one year.  Check 2-D echocardiogram to better assess EF  Add statin and beta blocker. Smoking cessation counseling. Cardiac Rehabilitation And Case Management Consultation  If hemodynamic stable, would consider fast-track discharge  Would consider noninvasive evaluation of the Ramus Intermedius lesion in the absence of any recurrent anginal pain.  01/2015 echo Study Conclusions  - Left ventricle: Moderately severe hypokinesis of mid anterolateral segment, apical lateral segment, mid/apical anterior segments. EF is 40%. The cavity size was normal. Wall thickness was increased in a pattern of mild LVH. - Aortic valve: Sclerosis without stenosis. There was no significant regurgitation. - Right ventricle: The cavity size was normal. Systolic function was normal.  07/2015 CT PE IMPRESSION: 1. Suboptimal contrast bolus timing in the pulmonary arterial tree, poor in the lobar vessels other than the right lower lobe. No central pulmonary embolus. If there is continued suspicion of acute PE nuclear medicine V/Q scan may be most helpful. 2. Situs abnormality in the chest with right side aortic arch and descending aorta. Mirror image branching. Other visible situs including cardiac and upper abdominal situs appear normal. 3. Calcified Coronary artery atherosclerosis. Hepatomegaly with fatty liver disease.  07/2015 PFTs: mild to mod ventilatory defect with small airway obstruction, moderately reduced DLCO.   07/2018 nuclear stress  There was no ST segment deviation noted during stress.  Defect 1: There is a medium defect of moderate severity present in the mid anterior, mid anterolateral  and apical anterior location.  Findings consistent with prior myocardial infarction.  This is an intermediate risk study.  Nuclear stress EF: 53%.    Assessment and Plan  1. CAD -no symptoms, continue current meds   2. Hyperlipideima -reasonable control given limitations on statin dosing given prior elevation of LFTs   3. HTN Above goal today, multiple other provider visits within the last 2 months bp's have been elevated - start chlorthalidone 12.5mg  daily, check BMET/Mg 2 weeks  4. History of syncope/bradycardia -no recent symptoms - loop recorder monitored by EP  F/u 6 months Arnoldo Lenis, M.D.

## 2020-04-10 NOTE — Telephone Encounter (Signed)
Pt saw Dr. [name] this morning and he changed some of his BP meds-- pt would like to know if he needs to continue taking the amLODipine (NORVASC) 10 MG tablet [[id number]]   Please give pt a call back @ [phone]

## 2020-04-10 NOTE — Telephone Encounter (Signed)
Pt voiced understanding that he is to be taking amlodipine in addition to the new medication (chlorthalidone 12.5 mg daily)

## 2020-04-10 NOTE — Progress Notes (Signed)
Patient Randy Hebert, male DOB:[birth year], 54 y.o. VHQ:[id number]  Chief Complaint  Patient presents with  . Neck Pain    Not really bothering me unless I am pulling or picking up things/here to go over MRI    HPI  Randy Hebert is a 54 y.o. male who has neck pain.  He had MRI showing: IMPRESSION: 1. No acute osseous abnormality. Minimal subligamentous edema at the C5-6 level underlying the posterior longitudinal ligament with a small defect, suspicious for partial tear. 2. Cervical spondylosis. Spinal canal stenosis is greatest at the C5-6 and C6-7 levels where there is moderate spinal canal stenosis and severe bilateral neural foraminal narrowing.  I have explained the findings to him.  I have told him to use ice, and his rubs for the right shoulder and neck area. I would not recommend surgery.  This may get worse over time.   Body mass index is 33.68 kg/m.  ROS  Review of Systems  Constitutional: Positive for activity change.  Respiratory: Positive for shortness of breath and wheezing.   Cardiovascular: Positive for chest pain and palpitations.  Musculoskeletal: Positive for arthralgias, back pain, myalgias, neck pain and neck stiffness.  Allergic/Immunologic: Positive for environmental allergies.  Neurological: Positive for headaches.  All other systems reviewed and are negative.   All other systems reviewed and are negative.  The following is a summary of the past history medically, past history surgically, known current medicines, social history and family history.  This information is gathered electronically by the computer from prior information and documentation.  I review this each visit and have found including this information at this point in the chart is beneficial and informative.    Past Medical History:  Diagnosis Date  . ACE inhibitor-aggravated angioedema   . Allergy   . Angio-edema   . Anxiety   . Arthritis   . Asthma    hip replacement   . Back pain   . Bradycardia 04/28/2017  . Bulging of cervical intervertebral disc   . CAD (coronary artery disease)    lateral STEMI 02/06/2015 00% D1 occlusion treated with Promus Premier 2.5 mm x 16 mm DES, 70% ramus stenosis, 40% mid RCA stenosis, 45% distal RCA stenosis, EF 45-50%  . CHF (congestive heart failure) ([place])   . COPD (chronic obstructive pulmonary disease) ([place])   . Depression   . Diabetes mellitus without complication ([place])   . 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 Dry eye   . Dyspnea   . Early satiety 09/23/2016  . GERD (gastroesophageal reflux disease)   . HCAP (healthcare-associated pneumonia) 05/15/2017  . Headache   . Heart murmur   . Hip pain   . Hyperlipidemia   . Hypertension   . Lobar pneumonia ([place]) 05/15/2017  . Melena 08/04/2018  . MI (myocardial infarction) ([place])   . Myocardial infarction ([place])   . Neck pain   . Non-ST elevation (NSTEMI) myocardial infarction ([place]) 04/27/2017  . NSTEMI (non-ST elevated myocardial infarction) ([place]) 04/26/2017  . Otitis media   . Pleurisy   . Pneumonia due to COVID-19 virus 10/07/2019  . Rectal bleeding 11/08/2018  . Sinus pause    9 sec sinus pause on telemetry after started on coreg after MI, avoid AV nodal blocking agent  . Sleep apnea   . 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 STEMI (ST elevation myocardial infarction) ([place]) 05/31/2015  . Substance abuse ([place])  alcoholic  . Syncope 06/29/2017  . Syncope and collapse 05/15/2017  . Transaminitis 08/04/2018  . Unilateral primary osteoarthritis, right hip 07/01/2016    Past Surgical History:  Procedure Laterality Date  . BIOPSY  10/09/2016   Procedure: BIOPSY;  Surgeon: Daneil Dolin, MD;  [facility];  Service: Endoscopy;;  . BIOPSY  11/28/2019   Procedure: BIOPSY;  Surgeon: Daneil Dolin, MD;  [facility];  Service: Endoscopy;;  . CARDIAC CATHETERIZATION N/A 02/06/2015   Procedure:  Left Heart Cath and Coronary Angiography;  Surgeon: Leonie Man, MD;  Location: [facility];  Service: Cardiovascular;  Laterality: N/A;  . CARDIAC CATHETERIZATION N/A 02/06/2015   Procedure: Coronary Stent Intervention;  Surgeon: Leonie Man, MD;  Location: [facility] [facility];  Service: Cardiovascular;  Laterality: N/A;  . COLONOSCOPY WITH PROPOFOL N/A 10/09/2016   Sigmoid and descending colon diverticulosis, four 4-6 mm polyps in sigmoid, one 4 mm polyp in descending. Tubular adenomas and hyperplastic. 5 year surveillance.   . COLONOSCOPY WITH PROPOFOL N/A 11/24/2016   Sigmoid and descending colon diverticulosis, four 4-6 mm polyps in sigmoid, one 4 mm polyp in descending. Tubular adenomas and hyperplastic. 5 year surveillance.   . CORONARY ANGIOPLASTY WITH STENT PLACEMENT  01/2015  . CORONARY STENT INTERVENTION N/A 04/27/2017   Procedure: CORONARY STENT INTERVENTION;  Surgeon: Nelva Bush, MD;  Location: [facility] [facility];  Service: Cardiovascular;  Laterality: N/A;  . ELECTROPHYSIOLOGY STUDY N/A 06/29/2017   Procedure: ELECTROPHYSIOLOGY STUDY;  Surgeon: Evans Lance, MD;  Location: [facility];  Service: Cardiovascular;  Laterality: N/A;  . ESOPHAGOGASTRODUODENOSCOPY (EGD) WITH PROPOFOL N/A 10/09/2016   Dr. Gala Romney: LA grade a esophagitis.  Barrett's esophagus, biopsy-proven.  Small hiatal hernia.  EGD February 2019  . ESOPHAGOGASTRODUODENOSCOPY (EGD) WITH PROPOFOL N/A 11/28/2019    salmon-colored esophageal mucosa (Barrett's) small hiatal hernia, portal hypertensive gastropathy, normal duodenum, 3 year surveillance  . INCISION / DRAINAGE HAND / FINGER    . LEFT HEART CATH AND CORONARY ANGIOGRAPHY N/A 04/27/2017   Procedure: LEFT HEART CATH AND CORONARY ANGIOGRAPHY;  Surgeon: Nelva Bush, MD;  Location: [place] [facility];  Service: Cardiovascular;  Laterality: N/A;  . LOOP RECORDER INSERTION  06/29/2017   Procedure: Loop Recorder Insertion;  Surgeon: Evans Lance,  MD;  Location: [facility];  Service: Cardiovascular;;  . POLYPECTOMY  11/24/2016   Procedure: POLYPECTOMY;  Surgeon: Daneil Dolin, MD;  [facility];  Service: Endoscopy;;  descending and sigmoid  . TOTAL HIP ARTHROPLASTY Right 07/01/2016  . TOTAL HIP ARTHROPLASTY Right 07/01/2016   Procedure: RIGHT TOTAL HIP ARTHROPLASTY ANTERIOR APPROACH;  Surgeon: Mcarthur Rossetti, MD;  Location: [facility];  Service: Orthopedics;  Laterality: Right;    Family History  Problem Relation Age of Onset  . Heart attack Father   . Stroke Father   . Arthritis Father   . Heart disease Father   . Cancer Mother        ???  . Arthritis Mother   . Heart disease Brother 19       died in sleep  . Early death Brother   . Diabetes Maternal Uncle   . Alzheimer's disease Maternal Grandmother     Social History Social History   Tobacco Use  . Smoking status: Current Every Day Smoker    Packs/day: 1.00    Years: 46.00    Pack years: 46.00    Types: Cigarettes  .  Smokeless tobacco: Former Systems developer    Types: Secondary school teacher  . Vaping Use: Former  Substance Use Topics  . Alcohol use: Yes    Alcohol/week: 0.0 standard drinks    Comment: half a pint every other day  . Drug use: No    Comment: history of drug use- marijuana, cocaine- quit 2014    Allergies  Allergen Reactions  . Carvedilol Other (See Comments)    Sinus pause on telemetry >3 seconds. Longest one 9 sec. No AV nodal agent  . Lisinopril Anaphylaxis, Shortness Of Breath and Swelling    Angioedema, required intubation and mechanical ventilation  . Amoxicillin Nausea And Vomiting and Nausea Only    Nausea only - no allergy  Did it involve swelling of the face/tongue/throat, SOB, or low BP? No Did it involve sudden or severe rash/hives, skin peeling, or any reaction on the inside of your mouth or nose? No Did you need to seek medical attention at a hospital or doctor's office? No When did it last happen?childhood  reaction If all above answers are "NO", may proceed with cephalosporin use.     Current Outpatient Medications  Medication Sig Dispense Refill  . acetaminophen (TYLENOL) 500 MG tablet Take 1,000 mg by mouth every 4 (four) hours as needed for mild pain or fever.     Marland Kitchen albuterol (PROVENTIL) (2.5 MG/3ML) 0.083% nebulizer solution Take 3 mLs (2.5 mg total) by nebulization every 6 (six) hours as needed for wheezing or shortness of breath. 150 mL 1  . amLODipine (NORVASC) 10 MG tablet TAKE 1 TABLET BY MOUTH ONCE A DAY. (Patient taking differently: Take 10 mg by mouth daily. ) 30 tablet 0  . ascorbic acid (VITAMIN C) 500 MG tablet Take 1 tablet (500 mg total) by mouth daily. (Patient taking differently: Take 500 mg by mouth every evening. ) 30 tablet 1  . aspirin 81 MG EC tablet Take 1 tablet (81 mg total) by mouth daily. (Patient taking differently: Take 81 mg by mouth 2 (two) times daily. ) 30 tablet   . atorvastatin (LIPITOR) 40 MG tablet TAKE 1 TABLET BY MOUTH ONCE A DAY. (Patient taking differently: Take 40 mg by mouth every evening. ) 30 tablet 0  . azelastine (ASTELIN) 0.1 % nasal spray USE 2 SPRAYS IN EACH NOSTRIL TWICE DAILY. 30 mL 5  . budesonide-formoterol (SYMBICORT) 160-4.5 MCG/ACT inhaler Inhale 2 puffs into the lungs 2 (two) times daily. 1 Inhaler 5  . Carboxymeth-Glycerin-Polysorb (REFRESH OPTIVE ADVANCED) 0.5-1-0.5 % SOLN Apply 1 drop to eye 2 (two) times daily as needed (dry eyes).     . chlorthalidone (HYGROTON) 25 MG tablet Take 0.5 tablets (12.5 mg total) by mouth daily. 45 tablet 1  . Cholecalciferol (VITAMIN D3) 125 MCG (5000 UT) CAPS Take 1 capsule (5,000 Units total) by mouth daily. (Patient taking differently: Take 5,000 Units by mouth daily in the afternoon. ) 90 capsule 0  . citalopram (CELEXA) 20 MG tablet Take 20 mg by mouth daily.    . cycloSPORINE (RESTASIS) 0.05 % ophthalmic emulsion Place 1 drop into both eyes 2 (two) times daily.    . ferrous sulfate 325 (65 FE)  MG tablet Take 325 mg by mouth daily with breakfast.    . furosemide (LASIX) 40 MG tablet Take 1 tablet (40 mg total) by mouth daily as needed for fluid. (Patient taking differently: Take 40 mg by mouth daily as needed for fluid (2-3x's a week). )    . GLIPIZIDE XL 5 MG  24 hr tablet TAKE 1 TABLET BY MOUTH DAILY WITH BREAKFAST. 30 tablet 0  . Magnesium 200 MG TABS Take 1 tablet (200 mg total) by mouth daily. (Patient taking differently: Take 200 mg by mouth every evening. ) 30 each 6  . metFORMIN (GLUCOPHAGE) 1000 MG tablet Take 1 tablet (1,000 mg total) by mouth 2 (two) times daily with a meal. 180 tablet 3  . mometasone (ELOCON) 0.1 % cream Apply 1 application topically 2 (two) times a week.     . nitroGLYCERIN (NITROSTAT) 0.4 MG SL tablet Place 1 tablet (0.4 mg total) under the tongue every 5 (five) minutes as needed for chest pain. 25 tablet 0  . pantoprazole (PROTONIX) 40 MG tablet TAKE 1 TABLET BY MOUTH ONCE A DAY. 90 tablet 3  . prednisoLONE acetate (PRED FORTE) 1 % ophthalmic suspension Place 4 drops into both eyes 2 (two) times a week.     Marland Kitchen PROVENTIL HFA 108 (90 Base) MCG/ACT inhaler INHALE 2 PUFFS INTO THE LUNGS EVERY 6 HOURS AS NEEDED FOR SHORTNESS OF BREATH OR WHEEZING. (Patient taking differently: Inhale 2 puffs into the lungs every 6 (six) hours as needed for wheezing or shortness of breath. ) 18 g 1  . SPIRIVA RESPIMAT 2.5 MCG/ACT AERS INHALE 1 PUFF INTO THE LUNGS DAILY. (Patient taking differently: Inhale 1 puff into the lungs daily. ) 4 g 2   No current facility-administered medications for this visit.     Physical Exam  Blood pressure (!) 141/88, pulse 99, height 5\' 5"  (1.651 m), weight 202 lb 6 oz (91.8 kg).  Constitutional: overall normal hygiene, normal nutrition, well developed, normal grooming, normal body habitus. Assistive device:none  Musculoskeletal: gait and station Limp none, muscle tone and strength are normal, no tremors or atrophy is present.  .  Neurological:  coordination overall normal.  Deep tendon reflex/nerve stretch intact.  Sensation normal.  Cranial nerves II-XII intact.   Skin:   Normal overall no scars, lesions, ulcers or rashes. No psoriasis.  Psychiatric: Alert and oriented x 3.  Recent memory intact, remote memory unclear.  Normal mood and affect. Well groomed.  Good eye contact.  Cardiovascular: overall no swelling, no varicosities, no edema bilaterally, normal temperatures of the legs and arms, no clubbing, cyanosis and good capillary refill.  Lymphatic: palpation is normal.  Neck has good ROM.  All other systems reviewed and are negative   The patient has been educated about the nature of the problem(s) and counseled on treatment options.  The patient appeared to understand what I have discussed and is in agreement with it.  Encounter Diagnoses  Name Primary?  . Neck pain Yes  . Chronic right shoulder pain   . Nicotine dependence, cigarettes, uncomplicated     PLAN Call if any problems.  Precautions discussed.  Continue current medications.   Return to clinic 6 weeks   Electronically Signed Sanjuana Kava, MD 8/10/202110:44 AM

## 2020-04-10 NOTE — Patient Instructions (Signed)

## 2020-04-10 NOTE — Patient Instructions (Signed)
Your physician wants you to follow-up in: [place] has recommended you make the following change in your medication:   START CHLORTHALIDONE 12.5 MG (1/2 TABLET) DAILY  Your physician recommends that you return for lab work in: 2 WEEKS BMP/MG  Thank you for choosing [facility]!!

## 2020-04-11 ENCOUNTER — Ambulatory Visit (INDEPENDENT_AMBULATORY_CARE_PROVIDER_SITE_OTHER): Payer: Medicare Other

## 2020-04-11 DIAGNOSIS — J309 Allergic rhinitis, unspecified: Secondary | ICD-10-CM

## 2020-04-12 ENCOUNTER — Other Ambulatory Visit: Payer: Self-pay | Admitting: Allergy & Immunology

## 2020-04-21 LAB — CUP PACEART REMOTE DEVICE CHECK
Date Time Interrogation Session: 20210818232712
Implantable Pulse Generator Implant Date: 20181029

## 2020-04-23 ENCOUNTER — Other Ambulatory Visit: Payer: Self-pay | Admitting: Cardiology

## 2020-04-23 ENCOUNTER — Ambulatory Visit (INDEPENDENT_AMBULATORY_CARE_PROVIDER_SITE_OTHER): Payer: Medicare Other | Admitting: *Deleted

## 2020-04-23 ENCOUNTER — Telehealth: Payer: Self-pay

## 2020-04-23 DIAGNOSIS — I5042 Chronic combined systolic (congestive) and diastolic (congestive) heart failure: Secondary | ICD-10-CM | POA: Diagnosis not present

## 2020-04-23 DIAGNOSIS — R55 Syncope and collapse: Secondary | ICD-10-CM | POA: Diagnosis not present

## 2020-04-23 NOTE — Telephone Encounter (Signed)
Patient called back to let us know that he did send in that manual transmission and he is awaiting a call back

## 2020-04-23 NOTE — Telephone Encounter (Signed)
Transmisison received/ reviewed.  Missing episodes were nocturnal.  This is known issue.  Per MD continue to monitor.  Pt notifie dof findings.

## 2020-04-23 NOTE — Telephone Encounter (Addendum)
[facility] summary received- 2 episodes of pauses with missing data.  Need manual send to fill in data.  Pt has history of asymptomatic nocturnal pauses which we are monitoring only.  Implant done for syncope.    Attempted to reach pt, no answer.  DPR on file ok to leave VM on cell.  Left detailed VM requesting manual transmission.  Device clinic # and hours provided for callback.

## 2020-04-24 LAB — BASIC METABOLIC PANEL WITH GFR
BUN/Creatinine Ratio: 14 (calc) (ref 6–22)
BUN: 9 mg/dL (ref 7–25)
CO2: 34 mmol/L — ABNORMAL HIGH (ref 20–32)
Calcium: 9.7 mg/dL (ref 8.6–10.3)
Chloride: 91 mmol/L — ABNORMAL LOW (ref 98–110)
Creat: 0.63 mg/dL — ABNORMAL LOW (ref 0.70–1.33)
GFR, Est African American: 129 mL/min/{1.73_m2} (ref >=60)
GFR, Est Non African American: 111 mL/min/{1.73_m2} (ref >=60)
Glucose, Bld: 128 mg/dL (ref 65–139)
Potassium: 3.4 mmol/L — ABNORMAL LOW (ref 3.5–5.3)
Sodium: 135 mmol/L (ref 135–146)

## 2020-04-24 LAB — MAGNESIUM: Magnesium: 1.3 mg/dL — ABNORMAL LOW (ref 1.5–2.5)

## 2020-04-25 ENCOUNTER — Ambulatory Visit (INDEPENDENT_AMBULATORY_CARE_PROVIDER_SITE_OTHER): Payer: Medicare Other

## 2020-04-25 DIAGNOSIS — J309 Allergic rhinitis, unspecified: Secondary | ICD-10-CM

## 2020-04-26 ENCOUNTER — Ambulatory Visit: Payer: Medicare Other | Admitting: Cardiology

## 2020-04-30 NOTE — Progress Notes (Signed)
[facility] Summary Report / Loop Recorder 

## 2020-05-02 ENCOUNTER — Ambulatory Visit: Payer: Medicare Other | Admitting: Student

## 2020-05-02 NOTE — Progress Notes (Deleted)
Cardiology Office Note    Date:  05/02/2020   ID:  [name], DOB March 30, 1966, MRN [id number]  PCP:  Randy Mayo, NP  Cardiologist: Randy Dolly, MD    No chief complaint on file.   History of Present Illness:    Randy Hebert is a 54 y.o. male ***    Past Medical History:  Diagnosis Date  . ACE inhibitor-aggravated angioedema   . Allergy   . Angio-edema   . Anxiety   . Arthritis   . Asthma    hip replacement  . Back pain   . Bradycardia 04/28/2017  . Bulging of cervical intervertebral disc   . CAD (coronary artery disease)    lateral STEMI 02/06/2015 00% D1 occlusion treated with Promus Premier 2.5 mm x 16 mm DES, 70% ramus stenosis, 40% mid RCA stenosis, 45% distal RCA stenosis, EF 45-50%  . CHF (congestive heart failure) ([place])   . COPD (chronic obstructive pulmonary disease) ([place])   . Depression   . Diabetes mellitus without complication ([place])   . 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 Dry eye   . Dyspnea   . Early satiety 09/23/2016  . GERD (gastroesophageal reflux disease)   . HCAP (healthcare-associated pneumonia) 05/15/2017  . Headache   . Heart murmur   . Hip pain   . Hyperlipidemia   . Hypertension   . Lobar pneumonia ([place]) 05/15/2017  . Melena 08/04/2018  . MI (myocardial infarction) ([place])   . Myocardial infarction ([place])   . Neck pain   . Non-ST elevation (NSTEMI) myocardial infarction ([place]) 04/27/2017  . NSTEMI (non-ST elevated myocardial infarction) ([place]) 04/26/2017  . Otitis media   . Pleurisy   . Pneumonia due to COVID-19 virus 10/07/2019  . Rectal bleeding 11/08/2018  . Sinus pause    9 sec sinus pause on telemetry after started on coreg after MI, avoid AV nodal blocking agent  . Sleep apnea   . 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 STEMI (ST elevation myocardial infarction) ([place]) 05/31/2015  . Substance abuse ([place]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  . Syncope 06/29/2017  . Syncope and collapse  05/15/2017  . Transaminitis 08/04/2018  . Unilateral primary osteoarthritis, right hip 07/01/2016    Past Surgical History:  Procedure Laterality Date  . BIOPSY  10/09/2016   Procedure: BIOPSY;  Surgeon: Randy Dolin, MD;  [facility];  Service: Endoscopy;;  . BIOPSY  11/28/2019   Procedure: BIOPSY;  Surgeon: Randy Dolin, MD;  [facility];  Service: Endoscopy;;  . CARDIAC CATHETERIZATION N/A 02/06/2015   Procedure: Left Heart Cath and Coronary Angiography;  Surgeon: Randy Man, MD;  Location: [facility];  Service: Cardiovascular;  Laterality: N/A;  . CARDIAC CATHETERIZATION N/A 02/06/2015   Procedure: Coronary Stent Intervention;  Surgeon: Randy Man, MD;  Location: [facility];  Service: Cardiovascular;  Laterality: N/A;  . COLONOSCOPY WITH PROPOFOL N/A 10/09/2016   Sigmoid and descending colon diverticulosis, four 4-6 mm polyps in sigmoid, one 4 mm polyp in descending. Tubular adenomas and hyperplastic. 5 year surveillance.   . COLONOSCOPY WITH PROPOFOL N/A 11/24/2016   Sigmoid and descending colon diverticulosis, four 4-6 mm polyps in sigmoid, one 4 mm polyp in descending. Tubular adenomas and hyperplastic. 5 year surveillance.   . CORONARY ANGIOPLASTY WITH STENT PLACEMENT  01/2015  . CORONARY STENT INTERVENTION N/A 04/27/2017   Procedure:  CORONARY STENT INTERVENTION;  Surgeon: Randy Bush, MD;  Location: [facility];  Service: Cardiovascular;  Laterality: N/A;  . ELECTROPHYSIOLOGY STUDY N/A 06/29/2017   Procedure: ELECTROPHYSIOLOGY STUDY;  Surgeon: Randy Lance, MD;  Location: [facility];  Service: Cardiovascular;  Laterality: N/A;  . ESOPHAGOGASTRODUODENOSCOPY (EGD) WITH PROPOFOL N/A 10/09/2016   Dr. Gala Hebert: LA grade a esophagitis.  Barrett's esophagus, biopsy-proven.  Small hiatal hernia.  EGD February 2019  . ESOPHAGOGASTRODUODENOSCOPY (EGD) WITH PROPOFOL N/A 11/28/2019    salmon-colored esophageal mucosa (Barrett's) small  hiatal hernia, portal hypertensive gastropathy, normal duodenum, 3 year surveillance  . INCISION / DRAINAGE HAND / FINGER    . LEFT HEART CATH AND CORONARY ANGIOGRAPHY N/A 04/27/2017   Procedure: LEFT HEART CATH AND CORONARY ANGIOGRAPHY;  Surgeon: Randy Bush, MD;  Location: [facility];  Service: Cardiovascular;  Laterality: N/A;  . LOOP RECORDER INSERTION  06/29/2017   Procedure: Loop Recorder Insertion;  Surgeon: Randy Lance, MD;  Location: [facility];  Service: Cardiovascular;;  . POLYPECTOMY  11/24/2016   Procedure: POLYPECTOMY;  Surgeon: Randy Dolin, MD;  [facility];  Service: Endoscopy;;  descending and sigmoid  . TOTAL HIP ARTHROPLASTY Right 07/01/2016  . TOTAL HIP ARTHROPLASTY Right 07/01/2016   Procedure: RIGHT TOTAL HIP ARTHROPLASTY ANTERIOR APPROACH;  Surgeon: Randy Rossetti, MD;  Location: [facility];  Service: Orthopedics;  Laterality: Right;    Current Medications: Outpatient Medications Prior to Visit  Medication Sig Dispense Refill  . acetaminophen (TYLENOL) 500 MG tablet Take 1,000 mg by mouth every 4 (four) hours as needed for mild pain or fever.     Marland Kitchen albuterol (PROVENTIL) (2.5 MG/3ML) 0.083% nebulizer solution Take 3 mLs (2.5 mg total) by nebulization every 6 (six) hours as needed for wheezing or shortness of breath. 150 mL 1  . amLODipine (NORVASC) 10 MG tablet TAKE 1 TABLET BY MOUTH ONCE A DAY. (Patient taking differently: Take 10 mg by mouth daily. ) 30 tablet 0  . ascorbic acid (VITAMIN C) 500 MG tablet Take 1 tablet (500 mg total) by mouth daily. (Patient taking differently: Take 500 mg by mouth every evening. ) 30 tablet 1  . aspirin 81 MG EC tablet Take 1 tablet (81 mg total) by mouth daily. (Patient taking differently: Take 81 mg by mouth 2 (two) times daily. ) 30 tablet   . atorvastatin (LIPITOR) 40 MG tablet TAKE 1 TABLET BY MOUTH ONCE A DAY. (Patient taking differently: Take 40 mg by mouth every evening. ) 30 tablet 0  .  azelastine (ASTELIN) 0.1 % nasal spray USE 2 SPRAYS IN EACH NOSTRIL TWICE DAILY. 30 mL 5  . Carboxymeth-Glycerin-Polysorb (REFRESH OPTIVE ADVANCED) 0.5-1-0.5 % SOLN Apply 1 drop to eye 2 (two) times daily as needed (dry eyes).     . chlorthalidone (HYGROTON) 25 MG tablet Take 0.5 tablets (12.5 mg total) by mouth daily. 45 tablet 1  . Cholecalciferol (VITAMIN D3) 125 MCG (5000 UT) CAPS Take 1 capsule (5,000 Units total) by mouth daily. (Patient taking differently: Take 5,000 Units by mouth daily in the afternoon. ) 90 capsule 0  . citalopram (CELEXA) 20 MG tablet Take 20 mg by mouth daily.    . cycloSPORINE (RESTASIS) 0.05 % ophthalmic emulsion Place 1 drop into both eyes 2 (two) times daily.    . ferrous sulfate 325 (65 FE) MG tablet Take 325 mg by mouth daily with breakfast.    . furosemide (LASIX) 40 MG tablet Take 1  tablet (40 mg total) by mouth daily as needed for fluid. (Patient taking differently: Take 40 mg by mouth daily as needed for fluid (2-3x's a week). )    . GLIPIZIDE XL 5 MG 24 hr tablet TAKE 1 TABLET BY MOUTH DAILY WITH BREAKFAST. 30 tablet 0  . Magnesium 200 MG TABS Take 1 tablet (200 mg total) by mouth daily. (Patient taking differently: Take 200 mg by mouth every evening. ) 30 each 6  . metFORMIN (GLUCOPHAGE) 1000 MG tablet Take 1 tablet (1,000 mg total) by mouth 2 (two) times daily with a meal. 180 tablet 3  . mometasone (ELOCON) 0.1 % cream Apply 1 application topically 2 (two) times a week.     . nitroGLYCERIN (NITROSTAT) 0.4 MG SL tablet Place 1 tablet (0.4 mg total) under the tongue every 5 (five) minutes as needed for chest pain. 25 tablet 0  . pantoprazole (PROTONIX) 40 MG tablet TAKE 1 TABLET BY MOUTH ONCE A DAY. 90 tablet 3  . prednisoLONE acetate (PRED FORTE) 1 % ophthalmic suspension Place 4 drops into both eyes 2 (two) times a week.     Marland Kitchen PROVENTIL HFA 108 (90 Base) MCG/ACT inhaler INHALE 2 PUFFS INTO THE LUNGS EVERY 6 HOURS AS NEEDED FOR SHORTNESS OF BREATH OR WHEEZING.  (Patient taking differently: Inhale 2 puffs into the lungs every 6 (six) hours as needed for wheezing or shortness of breath. ) 18 g 1  . SPIRIVA RESPIMAT 2.5 MCG/ACT AERS INHALE 1 PUFF INTO THE LUNGS DAILY. (Patient taking differently: Inhale 1 puff into the lungs daily. ) 4 g 2  . SYMBICORT 160-4.5 MCG/ACT inhaler INHALE 2 PUFFS 2 TIMES A DAY 10.2 g 1   No facility-administered medications prior to visit.     Allergies:   Carvedilol, Lisinopril, and Amoxicillin   Social History   Socioeconomic History  . Marital status: Divorced    Spouse name: alice  . Number of children: 3  . Years of education: 31  . Highest education level: Not on file  Occupational History  . Occupation: SSI  Tobacco Use  . Smoking status: Current Every Day Smoker    Packs/day: 1.00    Years: 46.00    Pack years: 46.00    Types: Cigarettes  . Smokeless tobacco: Former Systems developer    Types: Secondary school teacher  . Vaping Use: Former  Substance and Sexual Activity  . Alcohol use: Yes    Alcohol/week: 0.0 standard drinks    Comment: half a pint every other day  . Drug use: No    Comment: history of drug use- marijuana, cocaine- quit 2014  . Sexual activity: Yes    Partners: Female    Birth control/protection: None  Other Topics Concern  . Not on file  Social History Narrative   Lives with girlfriend [name]   3 children, but 1 OD  In 2019.   Grandchildren-2      Two cats: may and princess; and a dog-foxy      Enjoy: spending time with pets, TV, yard work       Diet: eats all food groups   Caffeine: half and half coffee, some tea-half and half decaf   Water: 6-8 cups daily      Wears seat belt   Does not use phone while driving   Oceanographer at home      Social Determinants of Health   Financial Resource Strain: Low Risk   . Difficulty of Paying Living Expenses: Not  hard at all  Food Insecurity: No Food Insecurity  . Worried About Charity fundraiser in the Last Year: Never true  . Ran Out of  Food in the Last Year: Never true  Transportation Needs: No Transportation Needs  . Lack of Transportation (Medical): No  . Lack of Transportation (Non-Medical): No  Physical Activity: Inactive  . Days of Exercise per Week: 0 days  . Minutes of Exercise per Session: 0 min  Stress: No Stress Concern Present  . Feeling of Stress : Only a little  Social Connections: Moderately Integrated  . Frequency of Communication with Friends and Family: More than three times a week  . Frequency of Social Gatherings with Friends and Family: More than three times a week  . Attends Religious Services: More than 4 times per year  . Active Member of Clubs or Organizations: No  . Attends Archivist Meetings: Never  . Marital Status: Living with partner     Family History:  The patient's ***family history includes Alzheimer's disease in his maternal grandmother; Arthritis in his father and mother; Cancer in his mother; Diabetes in his maternal uncle; Early death in his brother; Heart attack in his father; Heart disease in his father; Heart disease (age of onset: 72) in his brother; Stroke in his father.   Review of Systems:   Please see the history of present illness.     General:  No chills, fever, night sweats or weight changes.  Cardiovascular:  No chest pain, dyspnea on exertion, edema, orthopnea, palpitations, paroxysmal nocturnal dyspnea. Dermatological: No rash, lesions/masses Respiratory: No cough, dyspnea Urologic: No hematuria, dysuria Abdominal:   No nausea, vomiting, diarrhea, bright red blood per rectum, melena, or hematemesis Neurologic:  No visual changes, wkns, changes in mental status. All other systems reviewed and are otherwise negative except as noted above.   Physical Exam:    VS:  There were no vitals taken for this visit.   General: Well developed, well nourished,male appearing in no acute distress. Head: Normocephalic, atraumatic. Neck: No carotid bruits. JVD not  elevated.  Lungs: Respirations regular and unlabored, without wheezes or rales.  Heart: ***Regular rate and rhythm. No S3 or S4.  No murmur, no rubs, or gallops appreciated. Abdomen: Appears non-distended. No obvious abdominal masses. Msk:  Strength and tone appear normal for age. No obvious joint deformities or effusions. Extremities: No clubbing or cyanosis. No edema.  Distal pedal pulses are 2+ bilaterally. Neuro: Alert and oriented X 3. Moves all extremities spontaneously. No focal deficits noted. Psych:  Responds to questions appropriately with a normal affect. Skin: No rashes or lesions noted  Wt Readings from Last 3 Encounters:  04/10/20 202 lb 6 oz (91.8 kg)  04/10/20 205 lb (93 kg)  04/03/20 200 lb (90.7 kg)        Studies/Labs Reviewed:   EKG:  EKG is*** ordered today.  The ekg ordered today demonstrates ***  Recent Labs: 10/11/2019: ALT 80; Hemoglobin 15.5; Platelets 326 04/23/2020: BUN 9; Creat 0.63; Magnesium 1.3; Potassium 3.4; Sodium 135   Lipid Panel    Component Value Date/Time   CHOL 154 11/09/2019 0714   TRIG 319 (H) 11/09/2019 0714   HDL 30 (L) 11/09/2019 0714   CHOLHDL 5.1 (H) 11/09/2019 0714   VLDL 41 (H) 04/27/2017 0505   LDLCALC 86 11/09/2019 0714    Additional studies/ records that were reviewed today include:  ***  Assessment:    No diagnosis found.   Plan:   In  order of problems listed above:  1. ***    Medication Adjustments/Labs and Tests Ordered: Current medicines are reviewed at length with the patient today.  Concerns regarding medicines are outlined above.  Medication changes, Labs and Tests ordered today are listed in the Patient Instructions below. There are no Patient Instructions on file for this visit.   Signed, Erma Heritage, PA-C  05/02/2020 7:39 AM    [facility] [address] [place], [place] [address] Phone: [phone] Fax: [phone]

## 2020-05-03 ENCOUNTER — Telehealth: Payer: Self-pay | Admitting: *Deleted

## 2020-05-03 DIAGNOSIS — I10 Essential (primary) hypertension: Secondary | ICD-10-CM

## 2020-05-03 DIAGNOSIS — R899 Unspecified abnormal finding in specimens from other organs, systems and tissues: Secondary | ICD-10-CM

## 2020-05-03 MED ORDER — POTASSIUM CHLORIDE CRYS ER 20 MEQ PO TBCR: 40.0000 meq | EXTENDED_RELEASE_TABLET | Freq: Every day | ORAL | 1 refills | Status: DC

## 2020-05-03 NOTE — Telephone Encounter (Signed)
Pt voiced understanding and is not currently taking potassium at home and will increase magnesium to 400 mg daily - updated medication list and pt will come by office to pick up lab orders

## 2020-05-03 NOTE — Telephone Encounter (Signed)
-----   Message from Arnoldo Lenis, MD sent at 05/02/2020 11:16 AM EDT ----- Labs show potassium and magnesium low. Does not appear to be on potassium at home, please start KCl 23mEq daily. Clarify taking magnesium oxide 200mg  daily, if so increase to 400mg  daily. Recheck BMET/Mg in 2 weeks   [name] MD

## 2020-05-03 NOTE — Addendum Note (Signed)
Addended by: Julian Hy T on: 05/03/2020 12:35 PM   Modules accepted: Orders

## 2020-05-09 ENCOUNTER — Ambulatory Visit (INDEPENDENT_AMBULATORY_CARE_PROVIDER_SITE_OTHER): Payer: Medicare Other

## 2020-05-09 DIAGNOSIS — J309 Allergic rhinitis, unspecified: Secondary | ICD-10-CM | POA: Diagnosis not present

## 2020-05-11 ENCOUNTER — Other Ambulatory Visit: Payer: Self-pay | Admitting: Cardiology

## 2020-05-11 DIAGNOSIS — R899 Unspecified abnormal finding in specimens from other organs, systems and tissues: Secondary | ICD-10-CM | POA: Diagnosis not present

## 2020-05-11 DIAGNOSIS — I10 Essential (primary) hypertension: Secondary | ICD-10-CM | POA: Diagnosis not present

## 2020-05-12 LAB — BASIC METABOLIC PANEL WITH GFR
BUN: 7 mg/dL (ref 7–25)
CO2: 34 mmol/L — ABNORMAL HIGH (ref 20–32)
Calcium: 9.3 mg/dL (ref 8.6–10.3)
Chloride: 93 mmol/L — ABNORMAL LOW (ref 98–110)
Creat: 0.71 mg/dL (ref 0.70–1.33)
GFR, Est African American: 122 mL/min/{1.73_m2} (ref >=60)
GFR, Est Non African American: 106 mL/min/{1.73_m2} (ref >=60)
Glucose, Bld: 140 mg/dL — ABNORMAL HIGH (ref 65–99)
Potassium: 3.7 mmol/L (ref 3.5–5.3)
Sodium: 135 mmol/L (ref 135–146)

## 2020-05-12 LAB — MAGNESIUM: Magnesium: 1.1 mg/dL — ABNORMAL LOW (ref 1.5–2.5)

## 2020-05-16 ENCOUNTER — Ambulatory Visit (INDEPENDENT_AMBULATORY_CARE_PROVIDER_SITE_OTHER): Payer: Medicare Other

## 2020-05-16 DIAGNOSIS — J309 Allergic rhinitis, unspecified: Secondary | ICD-10-CM | POA: Diagnosis not present

## 2020-05-19 ENCOUNTER — Other Ambulatory Visit: Payer: Self-pay | Admitting: Family Medicine

## 2020-05-19 ENCOUNTER — Other Ambulatory Visit: Payer: Self-pay | Admitting: "Endocrinology

## 2020-05-22 ENCOUNTER — Other Ambulatory Visit: Payer: Self-pay | Admitting: "Endocrinology

## 2020-05-22 ENCOUNTER — Ambulatory Visit: Payer: Medicare Other | Admitting: Orthopaedic Surgery

## 2020-05-22 ENCOUNTER — Telehealth: Payer: Self-pay

## 2020-05-22 MED ORDER — METFORMIN HCL 1000 MG PO TABS
1000.0000 mg | ORAL_TABLET | Freq: Two times a day (BID) | ORAL | 1 refills | Status: DC
Start: 2020-05-22 — End: 2020-11-30

## 2020-05-22 NOTE — Telephone Encounter (Signed)
Yes we can 

## 2020-05-22 NOTE — Telephone Encounter (Signed)
Patient is asking if you can take over writing this script for metFORMIN (GLUCOPHAGE) 1000 MG tablet. [employer]

## 2020-05-23 ENCOUNTER — Telehealth: Payer: Self-pay | Admitting: Cardiology

## 2020-05-23 NOTE — Telephone Encounter (Signed)
Patient informed and confirmed that he has been taking OTC magnesium 500 mg daily

## 2020-05-23 NOTE — Telephone Encounter (Signed)
-----   Message from Arnoldo Lenis, MD sent at 05/23/2020  9:49 AM EDT ----- Labs look good other than low magnesium, verify taking magnesium oxide 400mg  daily and if so increase to bid    [name] MD

## 2020-05-23 NOTE — Telephone Encounter (Signed)
Patient called in regards to recent lab results.  States that someone sent a text to him.  Please call [phone].

## 2020-05-24 NOTE — Telephone Encounter (Signed)
THanks for update, if taking once daily please increase magnesium to bid dosing   [name] MD

## 2020-05-27 LAB — CUP PACEART REMOTE DEVICE CHECK
Date Time Interrogation Session: 20210918232249
Implantable Pulse Generator Implant Date: 20181029

## 2020-05-28 ENCOUNTER — Ambulatory Visit (INDEPENDENT_AMBULATORY_CARE_PROVIDER_SITE_OTHER): Payer: Medicare Other | Admitting: Emergency Medicine

## 2020-05-28 DIAGNOSIS — R55 Syncope and collapse: Secondary | ICD-10-CM

## 2020-05-29 NOTE — Addendum Note (Signed)
Addended by: Julian Hy T on: 05/29/2020 09:54 AM   Modules accepted: Orders

## 2020-05-29 NOTE — Telephone Encounter (Signed)
Pt was only taking daily - will increase to bid - updated medication list

## 2020-05-29 NOTE — Telephone Encounter (Signed)
Patient returned call

## 2020-05-30 ENCOUNTER — Ambulatory Visit (INDEPENDENT_AMBULATORY_CARE_PROVIDER_SITE_OTHER): Payer: Medicare Other

## 2020-05-30 DIAGNOSIS — J309 Allergic rhinitis, unspecified: Secondary | ICD-10-CM

## 2020-05-31 NOTE — Progress Notes (Signed)
[facility] Summary Report / Loop Recorder 

## 2020-06-06 ENCOUNTER — Ambulatory Visit (INDEPENDENT_AMBULATORY_CARE_PROVIDER_SITE_OTHER): Payer: Medicare Other

## 2020-06-06 DIAGNOSIS — J309 Allergic rhinitis, unspecified: Secondary | ICD-10-CM | POA: Diagnosis not present

## 2020-06-13 ENCOUNTER — Ambulatory Visit (INDEPENDENT_AMBULATORY_CARE_PROVIDER_SITE_OTHER): Payer: Medicare Other

## 2020-06-13 DIAGNOSIS — J309 Allergic rhinitis, unspecified: Secondary | ICD-10-CM

## 2020-06-15 ENCOUNTER — Other Ambulatory Visit: Payer: Self-pay | Admitting: Cardiology

## 2020-06-15 MED ORDER — ATORVASTATIN CALCIUM 40 MG PO TABS
40.0000 mg | ORAL_TABLET | Freq: Every evening | ORAL | 6 refills | Status: DC
Start: 2020-06-15 — End: 2020-06-20

## 2020-06-15 NOTE — Telephone Encounter (Signed)
Refilled atorvastatin 40 mg #30

## 2020-06-15 NOTE — Telephone Encounter (Signed)
New message      *STAT* If patient is at the pharmacy, call can be transferred to refill team.   1. Which medications need to be refilled? (please list name of each medication and dose if known) atorvastatin (LIPITOR) 40 MG tablet  2. Which pharmacy/location (including street and city if local pharmacy) is medication to be sent to? [facility]  3. Do they need a 30 day or 90 day supply? [place]

## 2020-06-20 ENCOUNTER — Ambulatory Visit (INDEPENDENT_AMBULATORY_CARE_PROVIDER_SITE_OTHER): Payer: Medicare Other | Admitting: Family Medicine

## 2020-06-20 ENCOUNTER — Ambulatory Visit (INDEPENDENT_AMBULATORY_CARE_PROVIDER_SITE_OTHER): Payer: Medicare Other

## 2020-06-20 ENCOUNTER — Other Ambulatory Visit: Payer: Self-pay

## 2020-06-20 ENCOUNTER — Encounter: Payer: Self-pay | Admitting: Family Medicine

## 2020-06-20 VITALS — BP 132/80 | HR 87 | Temp 97.5°F | Ht 65.0 in | Wt 204.0 lb

## 2020-06-20 DIAGNOSIS — J449 Chronic obstructive pulmonary disease, unspecified: Secondary | ICD-10-CM | POA: Diagnosis not present

## 2020-06-20 DIAGNOSIS — Z23 Encounter for immunization: Secondary | ICD-10-CM

## 2020-06-20 DIAGNOSIS — Z6834 Body mass index (BMI) 34.0-34.9, adult: Secondary | ICD-10-CM

## 2020-06-20 DIAGNOSIS — E1159 Type 2 diabetes mellitus with other circulatory complications: Secondary | ICD-10-CM

## 2020-06-20 DIAGNOSIS — R55 Syncope and collapse: Secondary | ICD-10-CM | POA: Diagnosis not present

## 2020-06-20 DIAGNOSIS — E782 Mixed hyperlipidemia: Secondary | ICD-10-CM

## 2020-06-20 DIAGNOSIS — Z0001 Encounter for general adult medical examination with abnormal findings: Secondary | ICD-10-CM | POA: Diagnosis not present

## 2020-06-20 DIAGNOSIS — E559 Vitamin D deficiency, unspecified: Secondary | ICD-10-CM

## 2020-06-20 DIAGNOSIS — Z125 Encounter for screening for malignant neoplasm of prostate: Secondary | ICD-10-CM | POA: Insufficient documentation

## 2020-06-20 DIAGNOSIS — I5042 Chronic combined systolic (congestive) and diastolic (congestive) heart failure: Secondary | ICD-10-CM | POA: Diagnosis not present

## 2020-06-20 DIAGNOSIS — F17219 Nicotine dependence, cigarettes, with unspecified nicotine-induced disorders: Secondary | ICD-10-CM

## 2020-06-20 DIAGNOSIS — E6609 Other obesity due to excess calories: Secondary | ICD-10-CM

## 2020-06-20 DIAGNOSIS — I10 Essential (primary) hypertension: Secondary | ICD-10-CM

## 2020-06-20 MED ORDER — ATORVASTATIN CALCIUM 40 MG PO TABS: 40.0000 mg | ORAL_TABLET | Freq: Every evening | ORAL | 6 refills | Status: DC

## 2020-06-20 NOTE — Assessment & Plan Note (Signed)
Heart health diet encouraged, continue lipitor, updated labs ordred

## 2020-06-20 NOTE — Assessment & Plan Note (Signed)
Cards does adjustment to medications.  Today he is controlled. Continue all medications as ordered. DASH diet, exercise- walking daily, and avoiding ETOH and smoking cessation all encouraged.

## 2020-06-20 NOTE — Progress Notes (Signed)
Health Maintenance reviewed -  Immunization History  Administered Date(s) Administered  . Influenza,inj,Quad PF,6+ Mos 07/03/2016, 05/07/2017, 06/20/2020  . Influenza-Unspecified 05/17/2018  . Moderna SARS-COVID-2 Vaccination 02/20/2020  . Pneumococcal Conjugate-13 06/17/2017  . Pneumococcal Polysaccharide-23 02/07/2015  . Tdap 05/17/2019   Last colonoscopy: 2018 Last PSA: 2017 Dentist: last week- partials to be placed 10/26 Ophtho: June 2021 Exercise: nothing structured- will walk dog on cool weather days Smoker: Yes, 1 pk daily  Alcohol Use: No whiskey in the last month, some beer limited  Other doctors caring for patient include:  Patient Care Team: Perlie Mayo, NP as PCP - General (Family Medicine) Evans Lance, MD as PCP - Electrophysiology (Cardiology) [name] Alphonse Guild, MD as PCP - Cardiology (Cardiology)  End of Life Discussion:  Patient has a living will and medical power of attorney  Subjective:   HPI  Randy Hebert is a 54 y.o. male who presents for annual visit and follow-up on chronic medical conditions.  He has the following concerns:   Reports sleeping well. Appetite is good, chewing and swallowing without issue. Denies changes with bladder or bowel habits. Denies blood in urine or stool. Denies falls or injuries recently. Denies memory changes. Denies skin, hearing or vision changes.   Review Of Systems  Review of Systems  Constitutional: Negative.   HENT: Negative.   Eyes: Negative.   Respiratory: Negative.   Cardiovascular: Negative.   Gastrointestinal: Negative.   Endocrine: Negative.   Genitourinary: Negative.   Musculoskeletal: Negative.   Skin: Negative.   Neurological: Negative.   Psychiatric/Behavioral: Negative.     Objective:   PHYSICAL EXAM:  BP 132/80 (BP Location: Left Arm, Patient Position: Sitting, Cuff Size: Large)   Pulse 87   Temp (!) 97.5 F (36.4 C) (Tympanic)   Ht 5\' 5"  (1.651 m)   Wt 204 lb (92.5 kg)    SpO2 95%   BMI 33.95 kg/m   Physical Exam Vitals and nursing note reviewed.  Constitutional:      Appearance: Normal appearance. He is well-developed and well-groomed. He is obese.  HENT:     Head: Normocephalic and atraumatic.     Right Ear: Hearing, tympanic membrane and ear canal normal.     Left Ear: Hearing, tympanic membrane and ear canal normal.     Ears:     Comments: Bubbled cartilage of helix, antihelix, and fossa    Nose: Nose normal.     Mouth/Throat:     Lips: Pink.     Mouth: Mucous membranes are moist.     Pharynx: Oropharynx is clear. Uvula midline.     Comments: Missing back molars  Mask in place  Eyes:     General: Lids are normal.        Right eye: No discharge.        Left eye: No discharge.     Extraocular Movements: Extraocular movements intact.     Conjunctiva/sclera: Conjunctivae normal.     Pupils: Pupils are equal, round, and reactive to light.     Comments: Glasses     Neck:     Thyroid: Thyroid mass present. No thyromegaly or thyroid tenderness.     Vascular: No carotid bruit.  Cardiovascular:     Rate and Rhythm: Normal rate and regular rhythm.     Pulses: Normal pulses.          Dorsalis pedis pulses are 2+ on the left side.       Posterior tibial pulses  are 2+ on the right side and 2+ on the left side.     Heart sounds: Normal heart sounds.  Pulmonary:     Effort: Pulmonary effort is normal.     Breath sounds: Examination of the right-upper field reveals wheezing. Examination of the left-upper field reveals wheezing. Examination of the right-middle field reveals wheezing. Examination of the left-middle field reveals wheezing. Wheezing present.  Abdominal:     General: Abdomen is protuberant. Bowel sounds are normal. There is no distension.     Palpations: Abdomen is soft.     Tenderness: There is no abdominal tenderness. There is no right CVA tenderness or left CVA tenderness.  Musculoskeletal:        General: Normal range of motion.      Cervical back: Full passive range of motion without pain, normal range of motion and neck supple.     Right lower leg: No edema.     Left lower leg: No edema.     Right foot: Normal range of motion.     Left foot: Normal range of motion.     Comments: MAE, ROM intact   Feet:     Right foot:     Protective Sensation: 5 sites tested. 5 sites sensed.     Skin integrity: Callus present.     Toenail Condition: Right toenails are long.     Left foot:     Protective Sensation: 5 sites tested. 5 sites sensed.     Skin integrity: Callus present.     Toenail Condition: Left toenails are long.  Lymphadenopathy:     Cervical: No cervical adenopathy.  Skin:    General: Skin is warm and dry.     Capillary Refill: Capillary refill takes less than 2 seconds.  Neurological:     General: No focal deficit present.     Mental Status: He is alert and oriented to person, place, and time.     Cranial Nerves: Cranial nerves are intact.     Sensory: Sensation is intact.     Motor: Motor function is intact.     Coordination: Coordination is intact.     Gait: Gait is intact.     Deep Tendon Reflexes: Reflexes are normal and symmetric.  Psychiatric:        Attention and Perception: Attention and perception normal.        Mood and Affect: Mood and affect normal.        Speech: Speech normal.        Behavior: Behavior normal. Behavior is cooperative.        Thought Content: Thought content normal.        Cognition and Memory: Cognition and memory normal.        Judgment: Judgment normal.     Comments: Good communication and eye contact     Diabetic Foot Form - Detailed   Diabetic Foot Exam - detailed Diabetic Foot exam was performed with the following findings: Yes 06/20/2020  9:18 AM  Visual Foot Exam completed.: Yes  Can the patient see the bottom of their feet?: Yes Are the shoes appropriate in style and fit?: Yes Is there swelling or and abnormal foot shape?: No Is there a claw toe deformity?:  No Is there elevated skin temparature?: No Is there foot or ankle muscle weakness?: No Normal Range of Motion: Yes Pulse Foot Exam completed.: Yes  Right posterior Tibialias: Present Left posterior Tibialias: Present  Right Dorsalis Pedis: Present Left Dorsalis Pedis: Present  Sensory Foot Exam Completed.: Yes Semmes-Weinstein Monofilament Test R Site 1-Great Toe: Pos L Site 1-Great Toe: Pos         Depression Screening  Depression screen [facility] 2/9 06/20/2020 06/20/2020 03/20/2020 12/08/2019 06/30/2018  Decreased Interest 0 0 0 0 0  Down, Depressed, Hopeless 0 0 0 0 0  PHQ - 2 Score 0 0 0 0 0  Altered sleeping - - - - -  Tired, decreased energy - - - - -  Change in appetite - - - - -  Feeling bad or failure about yourself  - - - - -  Trouble concentrating - - - - -  Moving slowly or fidgety/restless - - - - -  Suicidal thoughts - - - - -  PHQ-9 Score - - - - -  Difficult doing work/chores - - - - -  Some recent data might be hidden     Falls  Fall Risk  06/20/2020 03/20/2020 12/08/2019 11/09/2018 06/30/2018  Falls in the past year? 0 1 0 0 No  Number falls in past yr: 0 0 0 - -  Injury with Fall? 0 1 0 - -  Risk Factor Category  - - - - -  Comment - - - - -  Risk for fall due to : No Fall Risks History of fall(s) - - -  Follow up Falls evaluation completed Falls evaluation completed;Education provided;Falls prevention discussed - Falls evaluation completed -    Assessment & Plan:   1. Annual visit for general adult medical examination with abnormal findings   2. Chronic combined systolic and diastolic CHF (congestive heart failure) ([place])   3. Asthma-COPD overlap syndrome ([place])   4. DM type 2 causing vascular disease ([facility])   5. Class 1 obesity due to excess calories without serious comorbidity with body mass index (BMI) of 34.0 to 34.9 in adult   6. Mixed hyperlipidemia   7. Vitamin D deficiency   8. Cigarette nicotine dependence with nicotine-induced disorder   9.  Essential hypertension, benign   10. Need for immunization against influenza   11. Encounter for screening for malignant neoplasm of prostate     Tests ordered Orders Placed This Encounter  Procedures  . Flu Vaccine QUAD 36+ mos IM  . CBC  . Comprehensive metabolic panel  . Hemoglobin A1c  . Lipid panel  . TSH  . PSA  . VITAMIN D 25 Hydroxy (Vit-D Deficiency, Fractures)   Plan: Please see assessment and plan per problem list above.   Meds ordered this encounter  Medications  . atorvastatin (LIPITOR) 40 MG tablet    Sig: Take 1 tablet (40 mg total) by mouth every evening.    Dispense:  30 tablet    Refill:  6    Order Specific Question:   Supervising Provider    Answer:   Fayrene Helper [[id number]]    I have personally reviewed: The patient's medical and social history Their use of alcohol, tobacco or illicit drugs Their current medications and supplements The patient's functional ability including ADLs,fall risks, home safety risks, cognitive, and hearing and visual impairment Diet and physical activities Evidence for depression or mood disorders  The patient's weight, height, BMI, and visual acuity have been recorded in the chart.  I have made referrals, counseling, and provided education to the patient based on review of the above and I have provided the patient with a written personalized care plan for preventive services.    Note: This dictation was  prepared with Dragon dictation along with smaller phrase technology. Similar sounding words can be transcribed inadequately or may not be corrected upon review. Any transcriptional errors that result from this process are unintentional.      Perlie Mayo, NP   06/20/2020

## 2020-06-20 NOTE — Patient Instructions (Addendum)
  HAPPY FALL!  I appreciate the opportunity to provide you with care for your health and wellness. Today we discussed: overall health   Follow up: 4-5 months   [place] tomorrow morning before breakfast No referrals today  Great job on no drinking as much.  Continue to work on smoking less.  Please continue to practice social distancing to keep you, your family, and our community safe.  If you must go out, please wear a mask and practice good handwashing.  It was a pleasure to see you and I look forward to continuing to work together on your health and well-being. Please do not hesitate to call the office if you need care or have questions about your care.  Have a wonderful day and week. With Gratitude, Cherly Beach, DNP, AGNP-BC  HEALTH MAINTENANCE RECOMMENDATIONS:  It is recommended that you get at least 30 minutes of aerobic exercise at least 5 days/week (for weight loss, you may need as much as 60-90 minutes). This can be any activity that gets your heart rate up. This can be divided in 10-15 minute intervals if needed, but try and build up your endurance at least once a week.  Weight bearing exercise is also recommended twice weekly.  Eat a healthy diet with lots of vegetables, fruits and fiber.  "Colorful" foods have a lot of vitamins (ie green vegetables, tomatoes, red peppers, etc).  Limit sweet tea, regular sodas and alcoholic beverages, all of which has a lot of calories and sugar.  Up to 2 alcoholic drinks daily may be beneficial for men (unless trying to lose weight, watch sugars).  Drink a lot of water.  Sunscreen of at least SPF 30 should be used on all sun-exposed parts of the skin when outside between the hours of 10 am and 4 pm (not just when at beach or pool, but even with exercise, golf, tennis, and yard work!)  Use a sunscreen that says "broad spectrum" so it covers both UVA and UVB rays, and make sure to reapply every 1-2 hours.  Remember to change the  batteries in your smoke detectors when changing your clock times in the spring and fall.  Use your seat belt every time you are in a car, and please drive safely and not be distracted with cell phones and texting while driving.

## 2020-06-20 NOTE — Assessment & Plan Note (Signed)
Encouraged smoking cessation to help as he wheezing throughout on exam. Continue inhalers as ordered.

## 2020-06-20 NOTE — Assessment & Plan Note (Signed)
Discussed PSA screening (risks/benefits), recommended at least 30 minutes of aerobic activity at least 5 days/week; proper sunscreen use reviewed; healthy diet and alcohol recommendations (less than or equal to 2 drinks/day) reviewed; regular seatbelt use; changing batteries in smoke detectors. Immunization recommendations discussed.  Colonoscopy recommendations reviewed.

## 2020-06-20 NOTE — Assessment & Plan Note (Signed)
Stable   Randy Hebert is re-educated about the importance of exercise daily to help with weight management. A minumum of 30 minutes daily is recommended.   Wt Readings from Last 3 Encounters:  06/20/20 204 lb (92.5 kg)  04/10/20 202 lb 6 oz (91.8 kg)  04/10/20 205 lb (93 kg)

## 2020-06-20 NOTE — Assessment & Plan Note (Signed)
Patient was educated on the recommendation for flu vaccine. After obtaining informed consent, the vaccine was administered no adverse effects noted at time of administration. Patient provided with education on arm soreness and use of tylenol or ibuprofen (if safe) for this. Encourage to use the arm vaccine was given in to help reduce the soreness. Patient educated on the signs of a reaction to the vaccine and advised to contact the office should these occur.     

## 2020-06-20 NOTE — Assessment & Plan Note (Signed)
Smokes 1 pk daily, encouraged to stop. As this is not helpful for his COPD either.   Asked about quitting: confirms they are currently smokes cigarettes Advise to quit smoking: Educated about QUITTING to reduce the risk of cancer, cardio and cerebrovascular disease. Assess willingness: Unwilling to quit at this time, but is working on cutting back. Assist with counseling and pharmacotherapy: Counseled for 5 minutes and literature provided. Arrange for follow up: not quitting follow up in 3 months and continue to offer help.

## 2020-06-20 NOTE — Assessment & Plan Note (Signed)
Followed by Dr [name]. Overall doing well. Continue all medication as per Cards  Smoking and ETOH cessation are encouraged

## 2020-06-20 NOTE — Assessment & Plan Note (Signed)
Updated labs ordered.

## 2020-06-20 NOTE — Assessment & Plan Note (Signed)
Followed by Nida, controlled A1c at this time.  No DM ulcer noted on foot exam today.

## 2020-06-20 NOTE — Assessment & Plan Note (Signed)
Denies S&S of BPH, PSA ordered

## 2020-06-21 ENCOUNTER — Ambulatory Visit: Payer: Medicare Other | Admitting: "Endocrinology

## 2020-06-21 ENCOUNTER — Other Ambulatory Visit: Payer: Self-pay | Admitting: Family Medicine

## 2020-06-21 ENCOUNTER — Encounter: Payer: Medicare Other | Attending: "Endocrinology | Admitting: Nutrition

## 2020-06-21 ENCOUNTER — Encounter: Payer: Self-pay | Admitting: Nutrition

## 2020-06-21 VITALS — Ht 65.0 in | Wt 205.0 lb

## 2020-06-21 DIAGNOSIS — E782 Mixed hyperlipidemia: Secondary | ICD-10-CM

## 2020-06-21 DIAGNOSIS — K760 Fatty (change of) liver, not elsewhere classified: Secondary | ICD-10-CM | POA: Diagnosis not present

## 2020-06-21 DIAGNOSIS — E559 Vitamin D deficiency, unspecified: Secondary | ICD-10-CM | POA: Diagnosis not present

## 2020-06-21 DIAGNOSIS — I10 Essential (primary) hypertension: Secondary | ICD-10-CM | POA: Insufficient documentation

## 2020-06-21 DIAGNOSIS — E669 Obesity, unspecified: Secondary | ICD-10-CM | POA: Diagnosis present

## 2020-06-21 DIAGNOSIS — E1159 Type 2 diabetes mellitus with other circulatory complications: Secondary | ICD-10-CM | POA: Diagnosis not present

## 2020-06-21 DIAGNOSIS — F17200 Nicotine dependence, unspecified, uncomplicated: Secondary | ICD-10-CM | POA: Insufficient documentation

## 2020-06-21 NOTE — Patient Instructions (Addendum)
Goals  Keep up the great job! Quit smoking Walk a mile or more daily. Lose 2-3 lbs per month Get A1C to 5.7%

## 2020-06-21 NOTE — Progress Notes (Signed)
Medical Nutrition Therapy:  Appt start time 0915 end time: 0945 Assessment:  Primary concerns today: Diabetes Type 2.  A1C 6.5%, down from 6.8%. He went and got blood work done today for his PCP and should have A1C included. FBS: 118-138 mg/dl  Bedtime: 220's. He notes he has been eating later at times. Occasionally forgets his Metformin. Has made tremendous progress in his eating habits. Cut out a lot of processed and junk food. Has a garden. Stays busy working on his land he just bought. Has quit drinking hard wisky 100%. He  was drinking about  fifth of liqior a day at times. Has a few beers occassiosnally but doesn't like to drink alcohol of anykind anymore. .Been eating more salads-spinach, fruits, vegetables and better balanced meals. Metformin 1000 mg BID and Glipizide 5 mg. Has cut back a lot of cigarettes since he quit drinking. Working on quitting smoking 100%. Feels 100% better.  Has been put on magnesium and potassium.   Lab Results  Component Value Date   HGBA1C 6.5 (A) 02/20/2020   Lipid Panel     Component Value Date/Time   CHOL 154 11/09/2019 0714   TRIG 319 (H) 11/09/2019 0714   HDL 30 (L) 11/09/2019 0714   CHOLHDL 5.1 (H) 11/09/2019 0714   VLDL 41 (H) 04/27/2017 0505   LDLCALC 86 11/09/2019 0714   CMP Latest Ref Rng & Units 05/11/2020 04/23/2020 10/11/2019  Glucose 65 - 99 mg/dL 140(H) 128 137(H)  BUN 7 - 25 mg/dL 7 9 14   Creatinine 0.70 - 1.33 mg/dL 0.71 0.63(L) 0.66  Sodium 135 - 146 mmol/L 135 135 138  Potassium 3.5 - 5.3 mmol/L 3.7 3.4(L) 4.3  Chloride 98 - 110 mmol/L 93(L) 91(L) 102  CO2 20 - 32 mmol/L 34(H) 34(H) 28  Calcium 8.6 - 10.3 mg/dL 9.3 9.7 9.3  Total Protein 6.5 - 8.1 g/dL - - 7.2  Total Bilirubin 0.3 - 1.2 mg/dL - - 0.8  Alkaline Phos 38 - 126 U/L - - 99  AST 15 - 41 U/L - - 91(H)  ALT 0 - 44 U/L - - 80(H)   Wt Readings from Last 3 Encounters:  06/21/20 205 lb (93 kg)  06/20/20 204 lb (92.5 kg)  04/10/20 202 lb 6 oz (91.8 kg)   Ht Readings  from Last 3 Encounters:  06/21/20 5\' 5"  (1.651 m)  06/20/20 5\' 5"  (1.651 m)  04/10/20 5\' 5"  (1.651 m)   Body mass index is 34.11 kg/m. @BMIFA @ Facility age limit for growth percentiles is 20 years. Facility age limit for growth percentiles is 20 years.   Preferred Learning Style:   No preference indicated   Learning Readiness:   Ready  Change in progress   MEDICATIONS: See chart   B ) Rice krispies or oatmeal; boiled eggs, milk.  L) left over spaghetti, water  D ( PM): [place] stew, 2 c, water Beverages: water,   Estimated energy needs: 1800  calories 200 g carbohydrates 135 g protein 50 g fat  Progress Towards Goal(s):  In progress.   Nutritional Diagnosis:  NB-1.1 Food and nutrition-related knowledge deficit As related to Diabetes.  As evidenced by A1C 7.6%.    Intervention: Nutrition and Diabetes education provided on My Plate, CHO counting, meal planning, portion sizes, timing of meals, avoiding snacks between meals unless having a low blood sugar, target ranges for A1C and blood sugars, signs/symptoms and treatment of hyper/hypoglycemia, monitoring blood sugars, taking medications as prescribed, benefits of exercising 30 minutes per  day and prevention of complications of DM.  Goals  Keep up the great job! Quit smoking. Walk a mile or more daily. Lose 2-3 lbs per month Get A1C to 5.7%   Teaching Method Utilized:  Visual Auditory Hands on  Handouts given during visit include:  The Plate Method  Meal Plan Card  Diabetes Instructions.   Barriers to learning/adherence to lifestyle change: none  Demonstrated degree of understanding via:  Teach Back   Monitoring/Evaluation:  Dietary intake, exercise, meal planning, SBG, and body weight in 3 month(s).

## 2020-06-22 LAB — LIPID PANEL
Cholesterol: 164 mg/dL (ref <200)
HDL: 23 mg/dL — ABNORMAL LOW (ref >=40)
LDL Cholesterol (Calc): 116 mg/dL (calc) — ABNORMAL HIGH
Non-HDL Cholesterol (Calc): 141 mg/dL (calc) — ABNORMAL HIGH (ref <130)
Total CHOL/HDL Ratio: 7.1 (calc) — ABNORMAL HIGH (ref <5.0)
Triglycerides: 139 mg/dL (ref <150)

## 2020-06-22 LAB — COMPLETE METABOLIC PANEL WITH GFR
AG Ratio: 1.3 (calc) (ref 1.0–2.5)
ALT: 28 U/L (ref 9–46)
AST: 35 U/L (ref 10–35)
Albumin: 4.1 g/dL (ref 3.6–5.1)
Alkaline phosphatase (APISO): 139 U/L (ref 35–144)
BUN/Creatinine Ratio: 10 (calc) (ref 6–22)
BUN: 7 mg/dL (ref 7–25)
CO2: 30 mmol/L (ref 20–32)
Calcium: 9.7 mg/dL (ref 8.6–10.3)
Chloride: 100 mmol/L (ref 98–110)
Creat: 0.67 mg/dL — ABNORMAL LOW (ref 0.70–1.33)
GFR, Est African American: 125 mL/min/{1.73_m2} (ref >=60)
GFR, Est Non African American: 108 mL/min/{1.73_m2} (ref >=60)
Globulin: 3.2 g/dL (calc) (ref 1.9–3.7)
Glucose, Bld: 159 mg/dL — ABNORMAL HIGH (ref 65–99)
Potassium: 4.3 mmol/L (ref 3.5–5.3)
Sodium: 136 mmol/L (ref 135–146)
Total Bilirubin: 1 mg/dL (ref 0.2–1.2)
Total Protein: 7.3 g/dL (ref 6.1–8.1)

## 2020-06-22 LAB — CBC
HCT: 45.6 % (ref 38.5–50.0)
Hemoglobin: 15.2 g/dL (ref 13.2–17.1)
MCH: 30.6 pg (ref 27.0–33.0)
MCHC: 33.3 g/dL (ref 32.0–36.0)
MCV: 91.9 fL (ref 80.0–100.0)
MPV: 10.5 fL (ref 7.5–12.5)
Platelets: 228 10*3/uL (ref 140–400)
RBC: 4.96 10*6/uL (ref 4.20–5.80)
RDW: 12.8 % (ref 11.0–15.0)
WBC: 7.5 10*3/uL (ref 3.8–10.8)

## 2020-06-22 LAB — TSH: TSH: 3.61 mIU/L (ref 0.40–4.50)

## 2020-06-22 LAB — PSA: PSA: 0.23 ng/mL (ref <=4.0)

## 2020-06-22 LAB — VITAMIN D 25 HYDROXY (VIT D DEFICIENCY, FRACTURES): Vit D, 25-Hydroxy: 36 ng/mL (ref 30–100)

## 2020-06-22 LAB — HEMOGLOBIN A1C W/OUT EAG: Hgb A1c MFr Bld: 6.3 % of total Hgb — ABNORMAL HIGH (ref <5.7)

## 2020-06-23 LAB — CUP PACEART REMOTE DEVICE CHECK
Date Time Interrogation Session: 20211019233241
Implantable Pulse Generator Implant Date: 20181029

## 2020-06-26 NOTE — Progress Notes (Signed)
[facility] Summary Report / Loop Recorder 

## 2020-06-27 ENCOUNTER — Ambulatory Visit (INDEPENDENT_AMBULATORY_CARE_PROVIDER_SITE_OTHER): Payer: Medicare Other

## 2020-06-27 DIAGNOSIS — J309 Allergic rhinitis, unspecified: Secondary | ICD-10-CM | POA: Diagnosis not present

## 2020-07-11 ENCOUNTER — Ambulatory Visit (INDEPENDENT_AMBULATORY_CARE_PROVIDER_SITE_OTHER): Payer: Medicare Other

## 2020-07-11 DIAGNOSIS — J309 Allergic rhinitis, unspecified: Secondary | ICD-10-CM | POA: Diagnosis not present

## 2020-07-18 DIAGNOSIS — J301 Allergic rhinitis due to pollen: Secondary | ICD-10-CM | POA: Diagnosis not present

## 2020-07-18 NOTE — Progress Notes (Signed)
Vials exp 07-18-21

## 2020-07-19 ENCOUNTER — Other Ambulatory Visit: Payer: Medicare Other

## 2020-07-19 ENCOUNTER — Other Ambulatory Visit: Payer: Self-pay

## 2020-07-19 DIAGNOSIS — Z20822 Contact with and (suspected) exposure to covid-19: Secondary | ICD-10-CM

## 2020-07-19 DIAGNOSIS — J3089 Other allergic rhinitis: Secondary | ICD-10-CM | POA: Diagnosis not present

## 2020-07-20 ENCOUNTER — Other Ambulatory Visit: Payer: Self-pay

## 2020-07-20 ENCOUNTER — Ambulatory Visit (INDEPENDENT_AMBULATORY_CARE_PROVIDER_SITE_OTHER): Payer: Medicare Other | Admitting: Internal Medicine

## 2020-07-20 ENCOUNTER — Encounter: Payer: Self-pay | Admitting: Internal Medicine

## 2020-07-20 VITALS — BP 122/64 | HR 72 | Ht 65.0 in | Wt 210.0 lb

## 2020-07-20 DIAGNOSIS — R55 Syncope and collapse: Secondary | ICD-10-CM

## 2020-07-20 LAB — CUP PACEART INCLINIC DEVICE CHECK
Date Time Interrogation Session: 20211119084600
Implantable Pulse Generator Implant Date: 20181029

## 2020-07-20 LAB — SPECIMEN STATUS REPORT

## 2020-07-20 LAB — NOVEL CORONAVIRUS, NAA: SARS-CoV-2, NAA: NOT DETECTED

## 2020-07-20 LAB — SARS-COV-2, NAA 2 DAY TAT

## 2020-07-20 MED ORDER — FUROSEMIDE 40 MG PO TABS: 40.0000 mg | ORAL_TABLET | Freq: Every day | ORAL | 11 refills | Status: DC | PRN

## 2020-07-20 MED ORDER — NITROGLYCERIN 0.4 MG SL SUBL: 0.4000 mg | SUBLINGUAL_TABLET | SUBLINGUAL | 3 refills | Status: DC | PRN

## 2020-07-20 NOTE — Addendum Note (Signed)
Addended by: Levonne Hubert on: 07/20/2020 08:54 AM   Modules accepted: Orders

## 2020-07-20 NOTE — Patient Instructions (Signed)
Medication Instructions:  Your physician recommends that you continue on your current medications as directed. Please refer to the Current Medication list given to you today.  *If you need a refill on your cardiac medications before your next appointment, please call your pharmacy*   Lab Work: NONE   If you have labs (blood work) drawn today and your tests are completely normal, you will receive your results only by: . MyChart Message (if you have MyChart) OR . A paper copy in the mail If you have any lab test that is abnormal or we need to change your treatment, we will call you to review the results.   Testing/Procedures: NONE    Follow-Up: At [facility], you and your health needs are our priority.  As part of our continuing mission to provide you with exceptional heart care, we have created designated Provider Care Teams.  These Care Teams include your primary Cardiologist (physician) and Advanced Practice Providers (APPs -  Physician Assistants and Nurse Practitioners) who all work together to provide you with the care you need, when you need it.  We recommend signing up for the patient portal called "MyChart".  Sign up information is provided on this After Visit Summary.  MyChart is used to connect with patients for Virtual Visits (Telemedicine).  Patients are able to view lab/test results, encounter notes, upcoming appointments, etc.  Non-urgent messages can be sent to your provider as well.   To learn more about what you can do with MyChart, go to [email or web address].    Your next appointment:   1 year(s)  The format for your next appointment:   In Person  Provider:   Gregg Taylor, MD   Other Instructions Thank you for choosing [facility]!    

## 2020-07-20 NOTE — Progress Notes (Signed)
HPI [name] returns today for followup. He is a pleasant morbidly obese man with a h/o syncope, s/p ILR insertion. He has had some dizzy spells but was taking too much diuretic. He has stopped drinking and using cocaine. His son overdosed. He admits to some dietary indiscretion.  Allergies  Allergen Reactions  . Carvedilol Other (See Comments)    Sinus pause on telemetry >3 seconds. Longest one 9 sec. No AV nodal agent  . Lisinopril Anaphylaxis, Shortness Of Breath and Swelling    Angioedema, required intubation and mechanical ventilation  . Amoxicillin Nausea And Vomiting and Nausea Only    Nausea only - no allergy  Did it involve swelling of the face/tongue/throat, SOB, or low BP? No Did it involve sudden or severe rash/hives, skin peeling, or any reaction on the inside of your mouth or nose? No Did you need to seek medical attention at a hospital or doctor's office? No When did it last happen?childhood reaction If all above answers are "NO", may proceed with cephalosporin use.      Current Outpatient Medications  Medication Sig Dispense Refill  . acetaminophen (TYLENOL) 500 MG tablet Take 1,000 mg by mouth every 4 (four) hours as needed for mild pain or fever.     Marland Kitchen albuterol (PROVENTIL) (2.5 MG/3ML) 0.083% nebulizer solution Take 3 mLs (2.5 mg total) by nebulization every 6 (six) hours as needed for wheezing or shortness of breath. 150 mL 1  . amLODipine (NORVASC) 10 MG tablet TAKE 1 TABLET BY MOUTH ONCE A DAY. (Patient taking differently: Take 10 mg by mouth daily. ) 30 tablet 0  . ascorbic acid (VITAMIN C) 500 MG tablet Take 1 tablet (500 mg total) by mouth daily. (Patient taking differently: Take 500 mg by mouth every evening. ) 30 tablet 1  . aspirin 81 MG EC tablet Take 1 tablet (81 mg total) by mouth daily. (Patient taking differently: Take 81 mg by mouth 2 (two) times daily. ) 30 tablet   . atorvastatin (LIPITOR) 40 MG tablet Take 1 tablet (40 mg total) by  mouth every evening. 30 tablet 6  . azelastine (ASTELIN) 0.1 % nasal spray USE 2 SPRAYS IN EACH NOSTRIL TWICE DAILY. 30 mL 5  . Carboxymeth-Glycerin-Polysorb (REFRESH OPTIVE ADVANCED) 0.5-1-0.5 % SOLN Apply 1 drop to eye 2 (two) times daily as needed (dry eyes).     . chlorthalidone (HYGROTON) 25 MG tablet Take 0.5 tablets (12.5 mg total) by mouth daily. 45 tablet 1  . Cholecalciferol (VITAMIN D3) 125 MCG (5000 UT) CAPS Take 1 capsule (5,000 Units total) by mouth daily. (Patient taking differently: Take 5,000 Units by mouth daily in the afternoon. ) 90 capsule 0  . citalopram (CELEXA) 20 MG tablet Take 20 mg by mouth daily.    . cycloSPORINE (RESTASIS) 0.05 % ophthalmic emulsion Place 1 drop into both eyes 2 (two) times daily.    . ferrous sulfate 325 (65 FE) MG tablet Take 325 mg by mouth daily with breakfast.    . furosemide (LASIX) 40 MG tablet Take 1 tablet (40 mg total) by mouth daily as needed for fluid. (Patient taking differently: Take 40 mg by mouth daily as needed for fluid (2-3x's a week). )    . GLIPIZIDE XL 5 MG 24 hr tablet TAKE 1 TABLET BY MOUTH DAILY WITH BREAKFAST. 30 tablet 2  . magnesium gluconate (MAGONATE) 500 MG tablet Take 500 mg by mouth 2 (two) times daily.    . metFORMIN (GLUCOPHAGE) 1000  MG tablet Take 1 tablet (1,000 mg total) by mouth 2 (two) times daily with a meal. 180 tablet 1  . mometasone (ELOCON) 0.1 % cream Apply 1 application topically 2 (two) times a week.     . nitroGLYCERIN (NITROSTAT) 0.4 MG SL tablet Place 1 tablet (0.4 mg total) under the tongue every 5 (five) minutes as needed for chest pain. 25 tablet 3  . pantoprazole (PROTONIX) 40 MG tablet TAKE 1 TABLET BY MOUTH ONCE A DAY. 90 tablet 3  . potassium chloride SA (KLOR-CON) 20 MEQ tablet Take 2 tablets (40 mEq total) by mouth daily. 180 tablet 1  . prednisoLONE acetate (PRED FORTE) 1 % ophthalmic suspension Place 4 drops into both eyes 2 (two) times a week.     Marland Kitchen PROVENTIL HFA 108 (90 Base) MCG/ACT  inhaler INHALE 2 PUFFS INTO THE LUNGS EVERY 6 HOURS AS NEEDED FOR SHORTNESS OF BREATH OR WHEEZING. (Patient taking differently: Inhale 2 puffs into the lungs every 6 (six) hours as needed for wheezing or shortness of breath. ) 18 g 1  . SPIRIVA RESPIMAT 2.5 MCG/ACT AERS INHALE 1 PUFF INTO THE LUNGS DAILY. (Patient taking differently: Inhale 1 puff into the lungs daily. ) 4 g 2  . SYMBICORT 160-4.5 MCG/ACT inhaler INHALE 2 PUFFS 2 TIMES A DAY 10.2 g 1   No current facility-administered medications for this visit.     Past Medical History:  Diagnosis Date  . ACE inhibitor-aggravated angioedema   . Allergy   . Angio-edema   . Anxiety   . Arthritis   . Asthma    hip replacement  . Back pain   . Bradycardia 04/28/2017  . Bulging of cervical intervertebral disc   . CAD (coronary artery disease)    lateral STEMI 02/06/2015 00% D1 occlusion treated with Promus Premier 2.5 mm x 16 mm DES, 70% ramus stenosis, 40% mid RCA stenosis, 45% distal RCA stenosis, EF 45-50%  . CHF (congestive heart failure) ([place])   . COPD (chronic obstructive pulmonary disease) ([place])   . Depression   . Diabetes mellitus without complication ([place])   . 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 Dry eye   . Dyspnea   . Early satiety 09/23/2016  . GERD (gastroesophageal reflux disease)   . HCAP (healthcare-associated pneumonia) 05/15/2017  . Headache   . Heart murmur   . Hip pain   . Hyperlipidemia   . Hypertension   . Lobar pneumonia ([place]) 05/15/2017  . Melena 08/04/2018  . MI (myocardial infarction) ([place])   . Myocardial infarction ([place])   . Neck pain   . Non-ST elevation (NSTEMI) myocardial infarction ([place]) 04/27/2017  . NSTEMI (non-ST elevated myocardial infarction) ([place]) 04/26/2017  . Otitis media   . Pleurisy   . Pneumonia due to COVID-19 virus 10/07/2019  . Rectal bleeding 11/08/2018  . Right shoulder pain 03/20/2020  . Sinus pause    9 sec sinus pause on telemetry after  started on coreg after MI, avoid AV nodal blocking agent  . Sleep apnea   . 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 STEMI (ST elevation myocardial infarction) ([place]) 05/31/2015  . 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 Syncope 06/29/2017  . Syncope and collapse 05/15/2017  . Transaminitis 08/04/2018  . Unilateral primary osteoarthritis, right hip 07/01/2016    ROS:   All systems reviewed and negative except as noted in the HPI.   Past Surgical History:  Procedure Laterality Date  . BIOPSY  10/09/2016   Procedure: BIOPSY;  Surgeon: Daneil Dolin, MD;  [facility];  Service: Endoscopy;;  . BIOPSY  11/28/2019   Procedure: BIOPSY;  Surgeon: Daneil Dolin, MD;  [facility];  Service: Endoscopy;;  . CARDIAC CATHETERIZATION N/A 02/06/2015   Procedure: Left Heart Cath and Coronary Angiography;  Surgeon: Leonie Man, MD;  Location: [facility];  Service: Cardiovascular;  Laterality: N/A;  . CARDIAC CATHETERIZATION N/A 02/06/2015   Procedure: Coronary Stent Intervention;  Surgeon: Leonie Man, MD;  Location: [facility];  Service: Cardiovascular;  Laterality: N/A;  . COLONOSCOPY WITH PROPOFOL N/A 10/09/2016   Sigmoid and descending colon diverticulosis, four 4-6 mm polyps in sigmoid, one 4 mm polyp in descending. Tubular adenomas and hyperplastic. 5 year surveillance.   . COLONOSCOPY WITH PROPOFOL N/A 11/24/2016   Sigmoid and descending colon diverticulosis, four 4-6 mm polyps in sigmoid, one 4 mm polyp in descending. Tubular adenomas and hyperplastic. 5 year surveillance.   . CORONARY ANGIOPLASTY WITH STENT PLACEMENT  01/2015  . CORONARY STENT INTERVENTION N/A 04/27/2017   Procedure: CORONARY STENT INTERVENTION;  Surgeon: Nelva Bush, MD;  Location: [facility];  Service: Cardiovascular;  Laterality: N/A;  . ELECTROPHYSIOLOGY STUDY N/A 06/29/2017   Procedure: ELECTROPHYSIOLOGY STUDY;  Surgeon: Evans Lance, MD;  Location: [facility];  Service: Cardiovascular;  Laterality: N/A;  . ESOPHAGOGASTRODUODENOSCOPY (EGD) WITH PROPOFOL N/A 10/09/2016   Dr. Gala Romney: LA grade a esophagitis.  Barrett's esophagus, biopsy-proven.  Small hiatal hernia.  EGD February 2019  . ESOPHAGOGASTRODUODENOSCOPY (EGD) WITH PROPOFOL N/A 11/28/2019    salmon-colored esophageal mucosa (Barrett's) small hiatal hernia, portal hypertensive gastropathy, normal duodenum, 3 year surveillance  . INCISION / DRAINAGE HAND / FINGER    . LEFT HEART CATH AND CORONARY ANGIOGRAPHY N/A 04/27/2017   Procedure: LEFT HEART CATH AND CORONARY ANGIOGRAPHY;  Surgeon: Nelva Bush, MD;  Location: [facility];  Service: Cardiovascular;  Laterality: N/A;  . LOOP RECORDER INSERTION  06/29/2017   Procedure: Loop Recorder Insertion;  Surgeon: Evans Lance, MD;  Location: [facility];  Service: Cardiovascular;;  . POLYPECTOMY  11/24/2016   Procedure: POLYPECTOMY;  Surgeon: Daneil Dolin, MD;  [facility];  Service: Endoscopy;;  descending and sigmoid  . TOTAL HIP ARTHROPLASTY Right 07/01/2016  . TOTAL HIP ARTHROPLASTY Right 07/01/2016   Procedure: RIGHT TOTAL HIP ARTHROPLASTY ANTERIOR APPROACH;  Surgeon: Mcarthur Rossetti, MD;  Location: [facility];  Service: Orthopedics;  Laterality: Right;     Family History  Problem Relation Age of Onset  . Heart attack Father   . Stroke Father   . Arthritis Father   . Heart disease Father   . Cancer Mother        ???  . Arthritis Mother   . Heart disease Brother 50       died in sleep  . Early death Brother   . Diabetes Maternal Uncle   . Alzheimer's disease Maternal Grandmother      Social History   Socioeconomic History  . Marital status: Divorced    Spouse name: alice  . Number of children: 3  . Years of education: 37  . Highest education level: Not on file  Occupational History  . Occupation: SSI  Tobacco Use  . Smoking status: Current Every Day Smoker    Packs/day: 1.00     Years: 46.00  Pack years: 46.00    Types: Cigarettes  . Smokeless tobacco: Former Systems developer    Types: Secondary school teacher  . Vaping Use: Former  Substance and Sexual Activity  . Alcohol use: Yes    Alcohol/week: 0.0 standard drinks    Comment: half a pint every other day  . Drug use: No    Comment: history of drug use- marijuana, cocaine- quit 2014  . Sexual activity: Yes    Partners: Female    Birth control/protection: None  Other Topics Concern  . Not on file  Social History Narrative   Lives with girlfriend [name]   3 children, but 1 OD  In 2019.   Grandchildren-2      Two cats: may and princess; and a dog-foxy      Enjoy: spending time with pets, TV, yard work       Diet: eats all food groups   Caffeine: half and half coffee, some tea-half and half decaf   Water: 6-8 cups daily      Wears seat belt   Does not use phone while driving   Oceanographer at home      Social Determinants of Health   Financial Resource Strain: Low Risk   . Difficulty of Paying Living Expenses: Not hard at all  Food Insecurity: No Food Insecurity  . Worried About Charity fundraiser in the Last Year: Never true  . Ran Out of Food in the Last Year: Never true  Transportation Needs: No Transportation Needs  . Lack of Transportation (Medical): No  . Lack of Transportation (Non-Medical): No  Physical Activity: Insufficiently Active  . Days of Exercise per Week: 7 days  . Minutes of Exercise per Session: 20 min  Stress: No Stress Concern Present  . Feeling of Stress : Not at all  Social Connections: Moderately Integrated  . Frequency of Communication with Friends and Family: More than three times a week  . Frequency of Social Gatherings with Friends and Family: More than three times a week  . Attends Religious Services: More than 4 times per year  . Active Member of Clubs or Organizations: No  . Attends Archivist Meetings: Never  . Marital Status: Living with partner  Intimate  Partner Violence: Not At Risk  . Fear of Current or Ex-Partner: No  . Emotionally Abused: No  . Physically Abused: No  . Sexually Abused: No     BP 122/64   Pulse 72   Ht 5\' 5"  (1.651 m)   Wt 210 lb (95.3 kg)   SpO2 94%   BMI 34.95 kg/m   Physical Exam:  Morbidly obese appearing NAD HEENT: Unremarkable Neck:  6 cm JVD, no thyromegally Lymphatics:  No adenopathy Back:  No CVA tenderness Lungs:  Clear with no wheezes HEART:  Regular rate rhythm, no murmurs, no rubs, no clicks Abd:  soft, positive bowel sounds, no organomegally, no rebound, no guarding Ext:  2 plus pulses, no edema, no cyanosis, no clubbing Skin:  No rashes no nodules Neuro:  CN II through XII intact, motor grossly intact  DEVICE  Normal device function.  See PaceArt for details. ILR shows nocturnal bradycardia  Assess/Plan: 1. Syncope - he has not had any spells. He will undergo watchful waiting. 2. HTN - his bp is well controlled. He was taking too much diuretic and has changed his dose back. 3. Nocturnal brady - he appears to be asymptomatic.  4. Obesity - I encouraged him to  lose weight.   Carleene Overlie Tavyn Kurka,MD

## 2020-07-23 ENCOUNTER — Telehealth: Payer: Self-pay | Admitting: General Practice

## 2020-07-23 ENCOUNTER — Ambulatory Visit (INDEPENDENT_AMBULATORY_CARE_PROVIDER_SITE_OTHER): Payer: Medicare Other

## 2020-07-23 DIAGNOSIS — R55 Syncope and collapse: Secondary | ICD-10-CM | POA: Diagnosis not present

## 2020-07-23 LAB — CUP PACEART REMOTE DEVICE CHECK
Date Time Interrogation Session: 20211119223604
Implantable Pulse Generator Implant Date: 20181029

## 2020-07-23 NOTE — Telephone Encounter (Signed)
Patient called to get COVID results. Made him aware they were negative.

## 2020-07-25 ENCOUNTER — Ambulatory Visit (INDEPENDENT_AMBULATORY_CARE_PROVIDER_SITE_OTHER): Payer: Medicare Other

## 2020-07-25 DIAGNOSIS — J309 Allergic rhinitis, unspecified: Secondary | ICD-10-CM | POA: Diagnosis not present

## 2020-07-30 NOTE — Progress Notes (Signed)
[facility] Summary Report / Loop Recorder 

## 2020-08-01 ENCOUNTER — Other Ambulatory Visit: Payer: Self-pay | Admitting: "Endocrinology

## 2020-08-02 ENCOUNTER — Other Ambulatory Visit: Payer: Self-pay

## 2020-08-02 ENCOUNTER — Encounter: Payer: Self-pay | Admitting: "Endocrinology

## 2020-08-02 ENCOUNTER — Encounter: Payer: Medicare Other | Attending: "Endocrinology | Admitting: Nutrition

## 2020-08-02 ENCOUNTER — Ambulatory Visit (INDEPENDENT_AMBULATORY_CARE_PROVIDER_SITE_OTHER): Payer: Medicare Other | Admitting: "Endocrinology

## 2020-08-02 ENCOUNTER — Encounter: Payer: Self-pay | Admitting: Nutrition

## 2020-08-02 VITALS — Ht 65.0 in | Wt 207.0 lb

## 2020-08-02 VITALS — BP 132/74 | HR 80 | Ht 65.0 in | Wt 207.0 lb

## 2020-08-02 DIAGNOSIS — F17200 Nicotine dependence, unspecified, uncomplicated: Secondary | ICD-10-CM | POA: Insufficient documentation

## 2020-08-02 DIAGNOSIS — I10 Essential (primary) hypertension: Secondary | ICD-10-CM | POA: Insufficient documentation

## 2020-08-02 DIAGNOSIS — E1159 Type 2 diabetes mellitus with other circulatory complications: Secondary | ICD-10-CM | POA: Diagnosis not present

## 2020-08-02 DIAGNOSIS — E782 Mixed hyperlipidemia: Secondary | ICD-10-CM | POA: Insufficient documentation

## 2020-08-02 DIAGNOSIS — E669 Obesity, unspecified: Secondary | ICD-10-CM | POA: Diagnosis present

## 2020-08-02 DIAGNOSIS — K760 Fatty (change of) liver, not elsewhere classified: Secondary | ICD-10-CM | POA: Insufficient documentation

## 2020-08-02 DIAGNOSIS — I2510 Atherosclerotic heart disease of native coronary artery without angina pectoris: Secondary | ICD-10-CM | POA: Insufficient documentation

## 2020-08-02 NOTE — Patient Instructions (Signed)

## 2020-08-02 NOTE — Progress Notes (Signed)
Medical Nutrition Therapy:  Appt start time 1030 end time:  1100 Assessment:  Primary concerns today: Diabetes Type 2.   "I lost my girlfriend on Thanksgiving. She died from heart problems."  He notes he is dealing with a lot of family stress. FBS was 111- 130's  mg/dl this morning. Evenings in the 200's. Has been more consistent with his Metformin nightly now.  Sees a therapist with [facility] for counseling.Marland Kitchen Has remained absence from whiskey. Has a few beers at times.. Still working on cutting out cigarettes.  He wears a loop monitor. Had some dizzy spells. Sees Cardiology.    Lab Results  Component Value Date   HGBA1C 6.3 (H) 06/21/2020   Lipid Panel     Component Value Date/Time   CHOL 164 06/21/2020 0707   TRIG 139 06/21/2020 0707   HDL 23 (L) 06/21/2020 0707   CHOLHDL 7.1 (H) 06/21/2020 0707   VLDL 41 (H) 04/27/2017 0505   LDLCALC 116 (H) 06/21/2020 0707   CMP Latest Ref Rng & Units 06/21/2020 05/11/2020 04/23/2020  Glucose 65 - 99 mg/dL 159(H) 140(H) 128  BUN 7 - 25 mg/dL 7 7 9   Creatinine 0.70 - 1.33 mg/dL 0.67(L) 0.71 0.63(L)  Sodium 135 - 146 mmol/L 136 135 135  Potassium 3.5 - 5.3 mmol/L 4.3 3.7 3.4(L)  Chloride 98 - 110 mmol/L 100 93(L) 91(L)  CO2 20 - 32 mmol/L 30 34(H) 34(H)  Calcium 8.6 - 10.3 mg/dL 9.7 9.3 9.7  Total Protein 6.1 - 8.1 g/dL 7.3 - -  Total Bilirubin 0.2 - 1.2 mg/dL 1.0 - -  Alkaline Phos 38 - 126 U/L - - -  AST 10 - 35 U/L 35 - -  ALT 9 - 46 U/L 28 - -   Wt Readings from Last 3 Encounters:  07/20/20 210 lb (95.3 kg)  06/21/20 205 lb (93 kg)  06/20/20 204 lb (92.5 kg)   Ht Readings from Last 3 Encounters:  07/20/20 5\' 5"  (1.651 m)  06/21/20 5\' 5"  (1.651 m)  06/20/20 5\' 5"  (1.651 m)   There is no height or weight on file to calculate BMI. @BMIFA @ Facility age limit for growth percentiles is 20 years. Facility age limit for growth percentiles is 20 years.   Preferred Learning Style:   No preference indicated   Learning Readiness:    Ready  Change in progress   MEDICATIONS: See chart   B )  2-3 boiled eggs, bacon, or oatmeal  L) left over spaghetti, water  D ( PM): [place] stew, 2 c, water Beverages: water,   Estimated energy needs: 1800  calories 200 g carbohydrates 135 g protein 50 g fat  Progress Towards Goal(s):  In progress.   Nutritional Diagnosis:  NB-1.1 Food and nutrition-related knowledge deficit As related to Diabetes.  As evidenced by A1C 7.6%.    Intervention: Nutrition and Diabetes education provided on My Plate, CHO counting, meal planning, portion sizes, timing of meals, avoiding snacks between meals unless having a low blood sugar, target ranges for A1C and blood sugars, signs/symptoms and treatment of hyper/hypoglycemia, monitoring blood sugars, taking medications as prescribed, benefits of exercising 30 minutes per day and prevention of complications of DM.  Goals  Call Dr. [name] about your adjust heart medications and have been getting dizzy at times Eat three meals per day. Start walking a mile a day again or walk with  Your brother.  Get BS closer to 150's  before bed.  Lose 1 lb per week. Keep A1C  6.3% or less.    Teaching Method Utilized:  Visual Auditory Hands on  Handouts given during visit include:  The Plate Method  Meal Plan Card  Diabetes Instructions.   Barriers to learning/adherence to lifestyle change: none  Demonstrated degree of understanding via:  Teach Back   Monitoring/Evaluation:  Dietary intake, exercise, meal planning, SBG, and body weight in 3 month(s).

## 2020-08-02 NOTE — Patient Instructions (Addendum)
Goals  Call Dr. [name] about your adjust heart medications and have been getting dizzy at times Eat three meals per day. Start walking a mile a day again or walk with  Your brother.  Get BS closer to 150's  before bed.  Lose 1 lb per week. Keep A1C 6.3% or less. Stay in touch with [facility]  Counselor.

## 2020-08-02 NOTE — Progress Notes (Signed)
08/02/2020, 1:41 PM      Endocrinology follow-up note    Subjective:    Patient ID: HALEY Hebert, male    DOB: 1965/11/03.  Randy Hebert is being seen in  follow-up for management of currently uncontrolled symptomatic type 2 diabetes, hyperlipidemia, hypertension. PMD:  Perlie Mayo, NP.   Past Medical History:  Diagnosis Date  . ACE inhibitor-aggravated angioedema   . Allergy   . Angio-edema   . Anxiety   . Arthritis   . Asthma    hip replacement  . Back pain   . Bradycardia 04/28/2017  . Bulging of cervical intervertebral disc   . CAD (coronary artery disease)    lateral STEMI 02/06/2015 00% D1 occlusion treated with Promus Premier 2.5 mm x 16 mm DES, 70% ramus stenosis, 40% mid RCA stenosis, 45% distal RCA stenosis, EF 45-50%  . CHF (congestive heart failure) ([place])   . COPD (chronic obstructive pulmonary disease) ([place])   . Depression   . Diabetes mellitus without complication ([place])   . 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 Dry eye   . Dyspnea   . Early satiety 09/23/2016  . GERD (gastroesophageal reflux disease)   . HCAP (healthcare-associated pneumonia) 05/15/2017  . Headache   . Heart murmur   . Hip pain   . Hyperlipidemia   . Hypertension   . Lobar pneumonia ([place]) 05/15/2017  . Melena 08/04/2018  . MI (myocardial infarction) ([place])   . Myocardial infarction ([place])   . Neck pain   . Non-ST elevation (NSTEMI) myocardial infarction ([place]) 04/27/2017  . NSTEMI (non-ST elevated myocardial infarction) ([place]) 04/26/2017  . Otitis media   . Pleurisy   . Pneumonia due to COVID-19 virus 10/07/2019  . Rectal bleeding 11/08/2018  . Right shoulder pain 03/20/2020  . Sinus pause    9 sec sinus pause on telemetry after started on coreg after MI, avoid AV nodal blocking agent  . Sleep apnea   . 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 STEMI (ST elevation myocardial infarction) ([place])  05/31/2015  . 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 Syncope 06/29/2017  . Syncope and collapse 05/15/2017  . Transaminitis 08/04/2018  . Unilateral primary osteoarthritis, right hip 07/01/2016   Past Surgical History:  Procedure Laterality Date  . BIOPSY  10/09/2016   Procedure: BIOPSY;  Surgeon: Daneil Dolin, MD;  [facility];  Service: Endoscopy;;  . BIOPSY  11/28/2019   Procedure: BIOPSY;  Surgeon: Daneil Dolin, MD;  [facility];  Service: Endoscopy;;  . CARDIAC CATHETERIZATION N/A 02/06/2015   Procedure: Left Heart Cath and Coronary Angiography;  Surgeon: Leonie Man, MD;  Location: [facility];  Service: Cardiovascular;  Laterality: N/A;  . CARDIAC CATHETERIZATION N/A 02/06/2015   Procedure: Coronary Stent Intervention;  Surgeon: Leonie Man, MD;  Location: [facility];  Service: Cardiovascular;  Laterality: N/A;  . COLONOSCOPY WITH PROPOFOL N/A 10/09/2016   Sigmoid and descending colon diverticulosis, four 4-6 mm polyps in sigmoid, one 4 mm polyp in descending. Tubular adenomas and hyperplastic. 5 year surveillance.   . COLONOSCOPY WITH PROPOFOL N/A 11/24/2016   Sigmoid and descending colon diverticulosis, four 4-6 mm polyps in sigmoid, one 4 mm polyp in descending. Tubular adenomas and hyperplastic. 5 year surveillance.   . CORONARY ANGIOPLASTY WITH STENT PLACEMENT  01/2015  .  CORONARY STENT INTERVENTION N/A 04/27/2017   Procedure: CORONARY STENT INTERVENTION;  Surgeon: Nelva Bush, MD;  Location: [facility];  Service: Cardiovascular;  Laterality: N/A;  . ELECTROPHYSIOLOGY STUDY N/A 06/29/2017   Procedure: ELECTROPHYSIOLOGY STUDY;  Surgeon: Evans Lance, MD;  Location: [facility];  Service: Cardiovascular;  Laterality: N/A;  . ESOPHAGOGASTRODUODENOSCOPY (EGD) WITH PROPOFOL N/A 10/09/2016   Dr. Gala Romney: LA grade a esophagitis.  Barrett's esophagus, biopsy-proven.  Small hiatal hernia.  EGD February 2019  .  ESOPHAGOGASTRODUODENOSCOPY (EGD) WITH PROPOFOL N/A 11/28/2019    salmon-colored esophageal mucosa (Barrett's) small hiatal hernia, portal hypertensive gastropathy, normal duodenum, 3 year surveillance  . INCISION / DRAINAGE HAND / FINGER    . LEFT HEART CATH AND CORONARY ANGIOGRAPHY N/A 04/27/2017   Procedure: LEFT HEART CATH AND CORONARY ANGIOGRAPHY;  Surgeon: Nelva Bush, MD;  Location: [facility];  Service: Cardiovascular;  Laterality: N/A;  . LOOP RECORDER INSERTION  06/29/2017   Procedure: Loop Recorder Insertion;  Surgeon: Evans Lance, MD;  Location: [facility];  Service: Cardiovascular;;  . POLYPECTOMY  11/24/2016   Procedure: POLYPECTOMY;  Surgeon: Daneil Dolin, MD;  [facility];  Service: Endoscopy;;  descending and sigmoid  . TOTAL HIP ARTHROPLASTY Right 07/01/2016  . TOTAL HIP ARTHROPLASTY Right 07/01/2016   Procedure: RIGHT TOTAL HIP ARTHROPLASTY ANTERIOR APPROACH;  Surgeon: Mcarthur Rossetti, MD;  Location: [facility];  Service: Orthopedics;  Laterality: Right;   Social History   Socioeconomic History  . Marital status: Divorced    Spouse name: Randy Hebert  . Number of children: 3  . Years of education: 46  . Highest education level: Not on file  Occupational History  . Occupation: SSI  Tobacco Use  . Smoking status: Current Every Day Smoker    Packs/day: 1.00    Years: 46.00    Pack years: 46.00    Types: Cigarettes  . Smokeless tobacco: Former Systems developer    Types: Secondary school teacher  . Vaping Use: Former  Substance and Sexual Activity  . Alcohol use: Yes    Alcohol/week: 0.0 standard drinks    Comment: half a pint every other day  . Drug use: No    Comment: history of drug use- marijuana, cocaine- quit 2014  . Sexual activity: Yes    Partners: Female    Birth control/protection: None  Other Topics Concern  . Not on file  Social History Narrative   Lives with girlfriend Randy Hebert   3 children, but 1 OD  In 2019.   Grandchildren-2       Two cats: may and princess; and a dog-foxy      Enjoy: spending time with pets, TV, yard work       Diet: eats all food groups   Caffeine: half and half coffee, some tea-half and half decaf   Water: 6-8 cups daily      Wears seat belt   Does not use phone while driving   Oceanographer at home      Social Determinants of Health   Financial Resource Strain: Low Risk   . Difficulty of Paying Living Expenses: Not hard at all  Food Insecurity: No Food Insecurity  . Worried About Charity fundraiser in the Last Year: Never true  . Ran Out of Food in the Last Year: Never true  Transportation Needs: No Transportation Needs  . Lack of Transportation (Medical): No  . Lack of Transportation (Non-Medical): No  Physical Activity: Insufficiently Active  . Days of Exercise per Week: 7 days  . Minutes of Exercise per Session: 20 min  Stress: No Stress Concern Present  . Feeling of Stress : Not at all  Social Connections: Moderately Integrated  . Frequency of Communication with Friends and Family: More than three times a week  . Frequency of Social Gatherings with Friends and Family: More than three times a week  . Attends Religious Services: More than 4 times per year  . Active Member of Clubs or Organizations: No  . Attends Archivist Meetings: Never  . Marital Status: Living with partner   Outpatient Encounter Medications as of 08/02/2020  Medication Sig  . acetaminophen (TYLENOL) 500 MG tablet Take 1,000 mg by mouth every 4 (four) hours as needed for mild pain or fever.   Marland Kitchen albuterol (PROVENTIL) (2.5 MG/3ML) 0.083% nebulizer solution Take 3 mLs (2.5 mg total) by nebulization every 6 (six) hours as needed for wheezing or shortness of breath.  Marland Kitchen amLODipine (NORVASC) 10 MG tablet TAKE 1 TABLET BY MOUTH ONCE A DAY. (Patient taking differently: Take 10 mg by mouth daily. )  . ascorbic acid (VITAMIN C) 500 MG tablet Take 1 tablet (500 mg total) by mouth daily. (Patient taking  differently: Take 500 mg by mouth every evening. )  . aspirin 81 MG EC tablet Take 1 tablet (81 mg total) by mouth daily. (Patient taking differently: Take 81 mg by mouth 2 (two) times daily. )  . atorvastatin (LIPITOR) 40 MG tablet Take 1 tablet (40 mg total) by mouth every evening.  Marland Kitchen azelastine (ASTELIN) 0.1 % nasal spray USE 2 SPRAYS IN EACH NOSTRIL TWICE DAILY.  . Carboxymeth-Glycerin-Polysorb (REFRESH OPTIVE ADVANCED) 0.5-1-0.5 % SOLN Apply 1 drop to eye 2 (two) times daily as needed (dry eyes).   . chlorthalidone (HYGROTON) 25 MG tablet Take 0.5 tablets (12.5 mg total) by mouth daily.  . Cholecalciferol (VITAMIN D3) 125 MCG (5000 UT) CAPS Take 1 capsule (5,000 Units total) by mouth daily. (Patient taking differently: Take 5,000 Units by mouth daily in the afternoon. )  . citalopram (CELEXA) 20 MG tablet Take 20 mg by mouth daily.  . cycloSPORINE (RESTASIS) 0.05 % ophthalmic emulsion Place 1 drop into both eyes 2 (two) times daily.  . ferrous sulfate 325 (65 FE) MG tablet Take 325 mg by mouth daily with breakfast.  . furosemide (LASIX) 40 MG tablet Take 1 tablet (40 mg total) by mouth daily as needed for fluid.  Marland Kitchen glipiZIDE (GLUCOTROL XL) 5 MG 24 hr tablet TAKE 1 TABLET BY MOUTH DAILY WITH BREAKFAST.  . magnesium gluconate (MAGONATE) 500 MG tablet Take 500 mg by mouth 2 (two) times daily.  . metFORMIN (GLUCOPHAGE) 1000 MG tablet Take 1 tablet (1,000 mg total) by mouth 2 (two) times daily with a meal.  . mometasone (ELOCON) 0.1 % cream Apply 1 application topically 2 (two) times a week.   . nitroGLYCERIN (NITROSTAT) 0.4 MG SL tablet Place 1 tablet (0.4 mg total) under the tongue every 5 (five) minutes as needed for chest pain.  . pantoprazole (PROTONIX) 40 MG tablet TAKE 1 TABLET BY MOUTH ONCE A DAY.  Marland Kitchen potassium chloride SA (KLOR-CON) 20 MEQ tablet Take 2 tablets (40 mEq total) by mouth daily.  . prednisoLONE acetate (PRED FORTE) 1 % ophthalmic suspension Place 4 drops into both eyes 2 (two)  times a week.   Marland Kitchen PROVENTIL HFA 108 (90 Base) MCG/ACT inhaler INHALE 2 PUFFS INTO THE LUNGS EVERY  6 HOURS AS NEEDED FOR SHORTNESS OF BREATH OR WHEEZING. (Patient taking differently: Inhale 2 puffs into the lungs every 6 (six) hours as needed for wheezing or shortness of breath. )  . SPIRIVA RESPIMAT 2.5 MCG/ACT AERS INHALE 1 PUFF INTO THE LUNGS DAILY. (Patient taking differently: Inhale 1 puff into the lungs daily. )  . SYMBICORT 160-4.5 MCG/ACT inhaler INHALE 2 PUFFS 2 TIMES A DAY   No facility-administered encounter medications on file as of 08/02/2020.    ALLERGIES: Allergies  Allergen Reactions  . Carvedilol Other (See Comments)    Sinus pause on telemetry >3 seconds. Longest one 9 sec. No AV nodal agent  . Lisinopril Anaphylaxis, Shortness Of Breath and Swelling    Angioedema, required intubation and mechanical ventilation  . Amoxicillin Nausea And Vomiting and Nausea Only    Nausea only - no allergy  Did it involve swelling of the face/tongue/throat, SOB, or low BP? No Did it involve sudden or severe rash/hives, skin peeling, or any reaction on the inside of your mouth or nose? No Did you need to seek medical attention at a hospital or doctor's office? No When did it last happen?childhood reaction If all above answers are "NO", may proceed with cephalosporin use.     VACCINATION STATUS: Immunization History  Administered Date(s) Administered  . Influenza,inj,Quad PF,6+ Mos 07/03/2016, 05/07/2017, 06/20/2020  . Influenza-Unspecified 05/17/2018  . Moderna SARS-COVID-2 Vaccination 02/20/2020  . Pneumococcal Conjugate-13 06/17/2017  . Pneumococcal Polysaccharide-23 02/07/2015  . Tdap 05/17/2019    Diabetes He presents for his follow-up diabetic visit. He has type 2 diabetes mellitus. Onset time: He was diagnosed at approximate age of 67 years. His disease course has been improving. There are no hypoglycemic associated symptoms. Pertinent negatives for hypoglycemia include  no confusion, headaches, pallor or seizures. Pertinent negatives for diabetes include no chest pain, no fatigue, no polydipsia, no polyphagia, no polyuria and no weakness. There are no hypoglycemic complications. Symptoms are improving. Diabetic complications include heart disease. Risk factors for coronary artery disease include dyslipidemia, diabetes mellitus, hypertension, male sex, family history, obesity, sedentary lifestyle and tobacco exposure. Current diabetic treatment includes oral agent (monotherapy). He is compliant with treatment most of the time. His weight is fluctuating minimally. He is following a generally unhealthy diet. When asked about meal planning, he reported none. He has had a previous visit with a dietitian. He never participates in exercise. (He did not bring any logs nor meter for review.  He denies hypoglycemia, lowest reading has been 89.  His point-of-care A1c 6.3%, overall improving from 7.5%.     ) An ACE inhibitor/angiotensin II receptor blocker is contraindicated (He has documented allergy for ACE inhibitor's). He does not see a podiatrist.Eye exam is not current.  Hyperlipidemia This is a chronic problem. The current episode started more than 1 year ago. Exacerbating diseases include diabetes and obesity. Pertinent negatives include no chest pain, myalgias or shortness of breath. Current antihyperlipidemic treatment includes statins. Risk factors for coronary artery disease include dyslipidemia, diabetes mellitus, family history, obesity, male sex, hypertension and a sedentary lifestyle.  Hypertension This is a chronic problem. The current episode started more than 1 year ago. Pertinent negatives include no chest pain, headaches, neck pain, palpitations or shortness of breath. Risk factors for coronary artery disease include diabetes mellitus, dyslipidemia, obesity, sedentary lifestyle and smoking/tobacco exposure. Past treatments include calcium channel blockers.  Hypertensive end-organ damage includes CAD/MI.    Review of systems  Constitutional: + Minimally fluctuating body weight,  current  Body mass index is 34.45 kg/m. , no fatigue, no subjective hyperthermia, no subjective hypothermia Eyes: no blurry vision, no xerophthalmia ENT: no sore throat, no nodules palpated in throat, no dysphagia/odynophagia, no hoarseness Cardiovascular: no Chest Pain, no Shortness of Breath, no palpitations, no leg swelling Respiratory: no cough, no shortness of breath Gastrointestinal: no Nausea/Vomiting/Diarhhea Musculoskeletal: no muscle/joint aches Skin: no rashes, no hyperemia Neurological: no tremors, no numbness, no tingling, no dizziness Psychiatric: no depression, no anxiety    Objective:    BP 132/74   Pulse 80   Ht 5\' 5"  (1.651 m)   Wt 207 lb (93.9 kg)   BMI 34.45 kg/m   Wt Readings from Last 3 Encounters:  08/02/20 207 lb (93.9 kg)  08/02/20 207 lb (93.9 kg)  07/20/20 210 lb (95.3 kg)     Physical Exam- Limited  Constitutional:  Body mass index is 34.45 kg/m. , not in acute distress, normal state of mind Eyes:  EOMI, no exophthalmos Neck: Supple Thyroid: No gross goiter Respiratory: Adequate breathing efforts Musculoskeletal: no gross deformities, strength intact in all four extremities, no gross restriction of joint movements Skin:  no rashes, no hyperemia Neurological: no tremor with outstretched hands,   CMP     Component Value Date/Time   NA 136 06/21/2020 0707   K 4.3 06/21/2020 0707   CL 100 06/21/2020 0707   CO2 30 06/21/2020 0707   GLUCOSE 159 (H) 06/21/2020 0707   BUN 7 06/21/2020 0707   CREATININE 0.67 (L) 06/21/2020 0707   CALCIUM 9.7 06/21/2020 0707   PROT 7.3 06/21/2020 0707   ALBUMIN 3.6 10/11/2019 0406   AST 35 06/21/2020 0707   ALT 28 06/21/2020 0707   ALKPHOS 99 10/11/2019 0406   BILITOT 1.0 06/21/2020 0707   GFRNONAA 108 06/21/2020 0707   GFRAA 125 06/21/2020 0707    Diabetic Labs (most  recent): Lab Results  Component Value Date   HGBA1C 6.3 (H) 06/21/2020   HGBA1C 6.5 (A) 02/20/2020   HGBA1C 6.8 (H) 10/07/2019     Lipid Panel ( most recent) Lipid Panel     Component Value Date/Time   CHOL 164 06/21/2020 0707   TRIG 139 06/21/2020 0707   HDL 23 (L) 06/21/2020 0707   CHOLHDL 7.1 (H) 06/21/2020 0707   VLDL 41 (H) 04/27/2017 0505   LDLCALC 116 (H) 06/21/2020 0707      Lab Results  Component Value Date   TSH 3.61 06/21/2020   TSH 1.58 11/03/2018   TSH 1.79 11/17/2017   TSH 1.635 04/26/2017   TSH 2.564 06/27/2015   FREET4 1.5 11/03/2018   FREET4 1.4 11/17/2017      Assessment & Plan:   1. DM type 2 causing vascular disease ([place])  - Randy Hebert has currently uncontrolled symptomatic type 2 DM since  54 years of age.  He did not bring any logs nor meter for review.  He denies hypoglycemia, lowest reading has been 89.  His point-of-care A1c 6.3%, overall improving from 7.5%.       -his diabetes is complicated by  coronary artery disease which required stent placement, obesity/sedentary life, chronic heavy smoking, and PISTOL KESSENICH remains at a high risk for more acute and chronic complications which include CAD, CVA, CKD, retinopathy, and neuropathy. These are all discussed in detail with the patient.  - I have counseled him on diet management and weight loss, by adopting a carbohydrate restricted/protein rich diet. -Still admits to dietary indiscretions including consumption of sweets and  sweetened beverages.  - he  admits there is a room for improvement in his diet and drink choices. -  Suggestion is made for him to avoid simple carbohydrates  from his diet including Cakes, Sweet Desserts / Pastries, Ice Cream, Soda (diet and regular), Sweet Tea, Candies, Chips, Cookies, Sweet Pastries,  Store Bought Juices, Alcohol in Excess of  1-2 drinks a day, Artificial Sweeteners, Coffee Creamer, and "Sugar-free" Products. This will help patient to have  stable blood glucose profile and potentially avoid unintended weight gain.   - I encouraged him to switch to  unprocessed or minimally processed complex starch and increased protein intake (animal or plant source), fruits, and vegetables.  - he is advised to stick to a routine mealtimes to eat 3 meals  a day and avoid unnecessary snacks ( to snack only to correct hypoglycemia).    - I have approached him with the following individualized plan to manage diabetes and patient agrees:   -He has maintained reasonable control of his diabetes without insulin.  He is benefiting from low-dose glipizide.    -Given his history of coronary artery disease, will benefit from maintaining tight control of diabetes.   -He is advised to continue Metformin 1000 mg p.o. twice daily--daily after breakfast and after supper.     -He is advised to continue glipizide 5 mg XL p.o. daily at breakfast.     -He is approached to continue monitoring blood glucose at least once a day-daily before breakfast.   -He is encouraged to call clinic for blood glucose readings less than 70 or greater than 200x3 in a week.   -Patient specific target  A1c;  LDL, HDL, Triglycerides, and  Waist Circumference were discussed in detail.  2) BP/HTN:  -His blood pressure is controlled to target.  -He remains a chronic heavy smoker. He has a documented allergy of ACE inhibitors anaphylaxis .  He is advised to continue amlodipine 10 mg p.o. daily, with as needed diuretics.  He is extensively counseled for smoking cessation.     3) Lipids/HPL: Recent lipid panel showed controlled LDL at 86, triglycerides still high at 215.  He is advised to avoid butter and fried food.  Due to elevated liver enzymes (which are mainly due to his alcohol abuse) his atorvastatin was lowered to 40 mg p.o. nightly.   The patient was counseled on the dangers of tobacco use, and was advised to quit.  Reviewed strategies to maximize success, including removing  cigarettes and smoking materials from environment.   4)  Weight/Diet: His BMI is 63.8-VFIEPPI complicating his diabetes care.  He is a candidate for modest weight loss.  CDE Consult has been initiated and in progress,  exercise, and detailed carbohydrates information provided.  5) Chronic Care/Health Maintenance:  -he  is on Statin medications and  is encouraged to continue to follow up with Ophthalmology, Dentist,  Podiatrist at least yearly or according to recommendations, and advised to  Quit smoking ( is a chronic heavy smoker for the last 40 days), and consider weaning off of alcohol.  I have recommended yearly flu vaccine and pneumonia vaccination at least every 5 years; moderate intensity exercise for up to 150 minutes weekly; and  sleep for at least 7 hours a day.  - I advised patient to maintain close follow up with Perlie Mayo, NP for primary care needs.  - Time spent on this patient care encounter:  35 min, of which > 50% was spent in  counseling  and the rest reviewing his blood glucose logs , discussing his hypoglycemia and hyperglycemia episodes, reviewing his current and  previous labs / studies  ( including abstraction from other facilities) and medications  doses and developing a  long term treatment plan and documenting his care.   Please refer to Patient Instructions for Blood Glucose Monitoring and Insulin/Medications Dosing Guide"  in media tab for additional information. Please  also refer to " Patient Self Inventory" in the Media  tab for reviewed elements of pertinent patient history.  Randy Hebert participated in the discussions, expressed understanding, and voiced agreement with the above plans.  All questions were answered to his satisfaction. he is encouraged to contact clinic should he have any questions or concerns prior to his return visit.    Follow up plan: - Return in about 6 months (around 01/31/2021) for F/U with Pre-visit Labs, Meter, Logs, A1c here., ABI  in Office NV, Urine MA - NV.  Glade Lloyd, MD [facility] [address] [place], [place] [address] Phone: [phone]  Fax: [phone]    08/02/2020, 1:41 PM  This note was partially dictated with voice recognition software. Similar sounding words can be transcribed inadequately or may not  be corrected upon review.

## 2020-08-06 ENCOUNTER — Telehealth: Payer: Self-pay | Admitting: Internal Medicine

## 2020-08-06 NOTE — Telephone Encounter (Signed)
Patient states he will be moving and wont have a phone line anymore for his device. He states he thinks he will be okay without it and wants to know if he mails it back.

## 2020-08-07 NOTE — Telephone Encounter (Signed)
I let the pt know his monitor do not need a phone line.

## 2020-08-08 ENCOUNTER — Ambulatory Visit (INDEPENDENT_AMBULATORY_CARE_PROVIDER_SITE_OTHER): Payer: Medicare Other

## 2020-08-08 DIAGNOSIS — J309 Allergic rhinitis, unspecified: Secondary | ICD-10-CM

## 2020-08-14 ENCOUNTER — Other Ambulatory Visit: Payer: Self-pay | Admitting: Allergy & Immunology

## 2020-08-14 DIAGNOSIS — G4733 Obstructive sleep apnea (adult) (pediatric): Secondary | ICD-10-CM | POA: Diagnosis not present

## 2020-08-21 ENCOUNTER — Ambulatory Visit (INDEPENDENT_AMBULATORY_CARE_PROVIDER_SITE_OTHER): Payer: Medicare Other

## 2020-08-21 DIAGNOSIS — R55 Syncope and collapse: Secondary | ICD-10-CM

## 2020-08-21 LAB — CUP PACEART REMOTE DEVICE CHECK
Date Time Interrogation Session: 20211220223402
Implantable Pulse Generator Implant Date: 20181029

## 2020-08-29 ENCOUNTER — Ambulatory Visit (INDEPENDENT_AMBULATORY_CARE_PROVIDER_SITE_OTHER): Payer: Medicare Other

## 2020-08-29 DIAGNOSIS — J309 Allergic rhinitis, unspecified: Secondary | ICD-10-CM | POA: Diagnosis not present

## 2020-08-30 ENCOUNTER — Ambulatory Visit: Payer: Medicare Other | Admitting: Gastroenterology

## 2020-09-04 NOTE — Progress Notes (Signed)
[facility] Summary Report / Loop Recorder 

## 2020-09-06 ENCOUNTER — Encounter: Payer: Self-pay | Admitting: Gastroenterology

## 2020-09-11 ENCOUNTER — Telehealth: Payer: Self-pay

## 2020-09-11 ENCOUNTER — Other Ambulatory Visit: Payer: Self-pay

## 2020-09-11 DIAGNOSIS — E1159 Type 2 diabetes mellitus with other circulatory complications: Secondary | ICD-10-CM

## 2020-09-11 MED ORDER — ACCU-CHEK AVIVA PLUS VI STRP: ORAL_STRIP | 12 refills | Status: DC

## 2020-09-11 NOTE — Telephone Encounter (Signed)
Sent in

## 2020-09-11 NOTE — Telephone Encounter (Signed)
Patient is requesting testing strips for his Accu Chek Aviva. Pharmacy is [place]

## 2020-09-14 ENCOUNTER — Ambulatory Visit (INDEPENDENT_AMBULATORY_CARE_PROVIDER_SITE_OTHER): Payer: Medicare Other

## 2020-09-14 DIAGNOSIS — J309 Allergic rhinitis, unspecified: Secondary | ICD-10-CM

## 2020-09-21 LAB — CUP PACEART REMOTE DEVICE CHECK
Date Time Interrogation Session: 20220120223824
Implantable Pulse Generator Implant Date: 20181029

## 2020-10-03 ENCOUNTER — Ambulatory Visit (INDEPENDENT_AMBULATORY_CARE_PROVIDER_SITE_OTHER): Payer: Medicare Other

## 2020-10-03 DIAGNOSIS — J309 Allergic rhinitis, unspecified: Secondary | ICD-10-CM | POA: Diagnosis not present

## 2020-10-11 ENCOUNTER — Ambulatory Visit (INDEPENDENT_AMBULATORY_CARE_PROVIDER_SITE_OTHER): Payer: Medicare Other | Admitting: Cardiology

## 2020-10-11 ENCOUNTER — Other Ambulatory Visit: Payer: Self-pay

## 2020-10-11 ENCOUNTER — Encounter: Payer: Self-pay | Admitting: Cardiology

## 2020-10-11 VITALS — BP 134/76 | HR 84 | Ht 65.0 in | Wt 177.4 lb

## 2020-10-11 DIAGNOSIS — I2510 Atherosclerotic heart disease of native coronary artery without angina pectoris: Secondary | ICD-10-CM

## 2020-10-11 DIAGNOSIS — R55 Syncope and collapse: Secondary | ICD-10-CM | POA: Diagnosis not present

## 2020-10-11 DIAGNOSIS — I10 Essential (primary) hypertension: Secondary | ICD-10-CM

## 2020-10-11 DIAGNOSIS — E782 Mixed hyperlipidemia: Secondary | ICD-10-CM

## 2020-10-11 DIAGNOSIS — E1159 Type 2 diabetes mellitus with other circulatory complications: Secondary | ICD-10-CM | POA: Diagnosis not present

## 2020-10-11 NOTE — Patient Instructions (Signed)
Medication Instructions:  Your physician recommends that you continue on your current medications as directed. Please refer to the Current Medication list given to you today.  *If you need a refill on your cardiac medications before your next appointment, please call your pharmacy*   Lab Work: NONE   If you have labs (blood work) drawn today and your tests are completely normal, you will receive your results only by: . MyChart Message (if you have MyChart) OR . A paper copy in the mail If you have any lab test that is abnormal or we need to change your treatment, we will call you to review the results.   Testing/Procedures: NONE    Follow-Up: At [facility], you and your health needs are our priority.  As part of our continuing mission to provide you with exceptional heart care, we have created designated Provider Care Teams.  These Care Teams include your primary Cardiologist (physician) and Advanced Practice Providers (APPs -  Physician Assistants and Nurse Practitioners) who all work together to provide you with the care you need, when you need it.  We recommend signing up for the patient portal called "MyChart".  Sign up information is provided on this After Visit Summary.  MyChart is used to connect with patients for Virtual Visits (Telemedicine).  Patients are able to view lab/test results, encounter notes, upcoming appointments, etc.  Non-urgent messages can be sent to your provider as well.   To learn more about what you can do with MyChart, go to [email or web address].    Your next appointment:   6 month(s)  The format for your next appointment:   In Person  Provider:   Jonathan Branch, MD   Other Instructions Thank you for choosing [facility]!    

## 2020-10-11 NOTE — Progress Notes (Signed)
Clinical Summary Mr. Almanza is a 55 y.o.male seen today for follow up of the following medical problems.    1. CAD - admit 01/2015 with lateral STEMI, received DES to D1. There was a 70% ramus and 40% mid RCA that was medically managed - 01/2015 echo LVEF 40%, lateral wall hypokinesis - no beta blocker due to history of sinus pause and bradycardia. No ACE-I given history of severe angioedema, no ARB due to risk of cross reactivity - admit 12/2015 with atypical chest pain, worst with palpatoin and movement. Had done some heavy lifting the day it started, negative workup for ACS. Since that time no recurrent symptoms   04/2017 PCI with stent. -07/2018 nuclear stress prior infarct, no current ischemia  No recent  Chest pains. No SOB or DOE.  - compliant with meds   2. SOB/COPD - 07/2015 PFTs: mild to mod ventilatory defect with small airway obstruction.  - 07/2015 CT PE no PE, though suboptimal study - 07/2015 CXR no acute process.  PFTs with mild to mod vent defect, ABGs showed borderline resting hypoxemia.   - followed by Dr [name].    3. Hyperlipidemia - endocrine lowered atorva from 80 to 40mg  due to elevated LFTs.  10/2019 TC 154 HDL 30 TG 319 LDL 86 - 06/2020 TC 164 HDL 23 TG 139 LDL 116./ Has lost since 30 lbs since then - compliant with statin.   4. Syncope - nocturnal bradycardia noted on previous monitors, no significant daytime episodes - has loop recorder, followed by EP -no beta blockers due to sinus pauses.   -Jan 2022 loop recorder no events - no recent syncope. Intermittent dizzy spells, sometimes with standing or bending over - takes lasix only 1-2 times per month. On chlorthaldione 12.5mg  daily   5. HTN - he is compliant with meds    6. OSA   BT:DHRCBU last few yearshis son overdosed,passed away Girlfriend passed on Thanksgiving. THey lived together, he is working on moving.   Past Medical History:   Diagnosis Date  . ACE inhibitor-aggravated angioedema   . Allergy   . Angio-edema   . Anxiety   . Arthritis   . Asthma    hip replacement  . Back pain   . Bradycardia 04/28/2017  . Bulging of cervical intervertebral disc   . CAD (coronary artery disease)    lateral STEMI 02/06/2015 00% D1 occlusion treated with Promus Premier 2.5 mm x 16 mm DES, 70% ramus stenosis, 40% mid RCA stenosis, 45% distal RCA stenosis, EF 45-50%  . CHF (congestive heart failure) ([place])   . COPD (chronic obstructive pulmonary disease) ([place])   . Depression   . Diabetes mellitus without complication ([place])   . 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 Dry eye   . Dyspnea   . Early satiety 09/23/2016  . GERD (gastroesophageal reflux disease)   . HCAP (healthcare-associated pneumonia) 05/15/2017  . Headache   . Heart murmur   . Hip pain   . Hyperlipidemia   . Hypertension   . Lobar pneumonia ([place]) 05/15/2017  . Melena 08/04/2018  . MI (myocardial infarction) ([place])   . Myocardial infarction ([place])   . Neck pain   . Non-ST elevation (NSTEMI) myocardial infarction ([place]) 04/27/2017  . NSTEMI (non-ST elevated myocardial infarction) ([place]) 04/26/2017  . Otitis media   . Pleurisy   . Pneumonia due to COVID-19 virus 10/07/2019  . Rectal bleeding 11/08/2018  . Right  shoulder pain 03/20/2020  . Sinus pause    9 sec sinus pause on telemetry after started on coreg after MI, avoid AV nodal blocking agent  . Sleep apnea   . 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 STEMI (ST elevation myocardial infarction) ([place]) 05/31/2015  . Substance abuse ([place]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  . Syncope 06/29/2017  . Syncope and collapse 05/15/2017  . Transaminitis 08/04/2018  . Unilateral primary osteoarthritis, right hip 07/01/2016     Allergies  Allergen Reactions  . Carvedilol Other (See Comments)    Sinus pause on telemetry >3 seconds. Longest one 9 sec. No AV nodal agent  . Lisinopril  Anaphylaxis, Shortness Of Breath and Swelling    Angioedema, required intubation and mechanical ventilation  . Amoxicillin Nausea And Vomiting and Nausea Only    Nausea only - no allergy  Did it involve swelling of the face/tongue/throat, SOB, or low BP? No Did it involve sudden or severe rash/hives, skin peeling, or any reaction on the inside of your mouth or nose? No Did you need to seek medical attention at a hospital or doctor's office? No When did it last happen?childhood reaction If all above answers are "NO", may proceed with cephalosporin use.      Current Outpatient Medications  Medication Sig Dispense Refill  . glucose blood (ACCU-CHEK AVIVA PLUS) test strip Use as instructed to check blood glucose once daily, DX E11.59 100 each 12  . acetaminophen (TYLENOL) 500 MG tablet Take 1,000 mg by mouth every 4 (four) hours as needed for mild pain or fever.     Marland Kitchen albuterol (PROVENTIL) (2.5 MG/3ML) 0.083% nebulizer solution Take 3 mLs (2.5 mg total) by nebulization every 6 (six) hours as needed for wheezing or shortness of breath. 150 mL 1  . amLODipine (NORVASC) 10 MG tablet TAKE 1 TABLET BY MOUTH ONCE A DAY. (Patient taking differently: Take 10 mg by mouth daily. ) 30 tablet 0  . ascorbic acid (VITAMIN C) 500 MG tablet Take 1 tablet (500 mg total) by mouth daily. (Patient taking differently: Take 500 mg by mouth every evening. ) 30 tablet 1  . aspirin 81 MG EC tablet Take 1 tablet (81 mg total) by mouth daily. (Patient taking differently: Take 81 mg by mouth 2 (two) times daily. ) 30 tablet   . atorvastatin (LIPITOR) 40 MG tablet Take 1 tablet (40 mg total) by mouth every evening. 30 tablet 6  . azelastine (ASTELIN) 0.1 % nasal spray USE 2 SPRAYS IN EACH NOSTRIL TWICE DAILY. 30 mL 5  . Carboxymeth-Glycerin-Polysorb (REFRESH OPTIVE ADVANCED) 0.5-1-0.5 % SOLN Apply 1 drop to eye 2 (two) times daily as needed (dry eyes).     . chlorthalidone (HYGROTON) 25 MG tablet Take 0.5 tablets  (12.5 mg total) by mouth daily. 45 tablet 1  . Cholecalciferol (VITAMIN D3) 125 MCG (5000 UT) CAPS Take 1 capsule (5,000 Units total) by mouth daily. (Patient taking differently: Take 5,000 Units by mouth daily in the afternoon. ) 90 capsule 0  . citalopram (CELEXA) 20 MG tablet Take 20 mg by mouth daily.    . cycloSPORINE (RESTASIS) 0.05 % ophthalmic emulsion Place 1 drop into both eyes 2 (two) times daily.    . ferrous sulfate 325 (65 FE) MG tablet Take 325 mg by mouth daily with breakfast.    . furosemide (LASIX) 40 MG tablet Take 1 tablet (40 mg total) by mouth daily as needed for fluid. 30 tablet 11  . glipiZIDE (GLUCOTROL  XL) 5 MG 24 hr tablet TAKE 1 TABLET BY MOUTH DAILY WITH BREAKFAST. 30 tablet 0  . magnesium gluconate (MAGONATE) 500 MG tablet Take 500 mg by mouth 2 (two) times daily.    . metFORMIN (GLUCOPHAGE) 1000 MG tablet Take 1 tablet (1,000 mg total) by mouth 2 (two) times daily with a meal. 180 tablet 1  . mometasone (ELOCON) 0.1 % cream Apply 1 application topically 2 (two) times a week.     . nitroGLYCERIN (NITROSTAT) 0.4 MG SL tablet Place 1 tablet (0.4 mg total) under the tongue every 5 (five) minutes as needed for chest pain. 25 tablet 3  . pantoprazole (PROTONIX) 40 MG tablet TAKE 1 TABLET BY MOUTH ONCE A DAY. 90 tablet 3  . potassium chloride SA (KLOR-CON) 20 MEQ tablet Take 2 tablets (40 mEq total) by mouth daily. 180 tablet 1  . prednisoLONE acetate (PRED FORTE) 1 % ophthalmic suspension Place 4 drops into both eyes 2 (two) times a week.     Marland Kitchen PROVENTIL HFA 108 (90 Base) MCG/ACT inhaler INHALE 2 PUFFS INTO THE LUNGS EVERY 6 HOURS AS NEEDED FOR SHORTNESS OF BREATH OR WHEEZING. (Patient taking differently: Inhale 2 puffs into the lungs every 6 (six) hours as needed for wheezing or shortness of breath. ) 18 g 1  . SPIRIVA RESPIMAT 2.5 MCG/ACT AERS INHALE 1 PUFF INTO THE LUNGS DAILY. 4 g 0  . SYMBICORT 160-4.5 MCG/ACT inhaler INHALE 2 PUFFS INTO THE LUNGS TWO TIMES DAILY. 10.2 g  0   No current facility-administered medications for this visit.     Past Surgical History:  Procedure Laterality Date  . BIOPSY  10/09/2016   Procedure: BIOPSY;  Surgeon: Daneil Dolin, MD;  [facility];  Service: Endoscopy;;  . BIOPSY  11/28/2019   Procedure: BIOPSY;  Surgeon: Daneil Dolin, MD;  [facility];  Service: Endoscopy;;  . CARDIAC CATHETERIZATION N/A 02/06/2015   Procedure: Left Heart Cath and Coronary Angiography;  Surgeon: Leonie Man, MD;  Location: [facility];  Service: Cardiovascular;  Laterality: N/A;  . CARDIAC CATHETERIZATION N/A 02/06/2015   Procedure: Coronary Stent Intervention;  Surgeon: Leonie Man, MD;  Location: [facility];  Service: Cardiovascular;  Laterality: N/A;  . COLONOSCOPY WITH PROPOFOL N/A 10/09/2016   Sigmoid and descending colon diverticulosis, four 4-6 mm polyps in sigmoid, one 4 mm polyp in descending. Tubular adenomas and hyperplastic. 5 year surveillance.   . COLONOSCOPY WITH PROPOFOL N/A 11/24/2016   Sigmoid and descending colon diverticulosis, four 4-6 mm polyps in sigmoid, one 4 mm polyp in descending. Tubular adenomas and hyperplastic. 5 year surveillance.   . CORONARY ANGIOPLASTY WITH STENT PLACEMENT  01/2015  . CORONARY STENT INTERVENTION N/A 04/27/2017   Procedure: CORONARY STENT INTERVENTION;  Surgeon: Nelva Bush, MD;  Location: [facility];  Service: Cardiovascular;  Laterality: N/A;  . ELECTROPHYSIOLOGY STUDY N/A 06/29/2017   Procedure: ELECTROPHYSIOLOGY STUDY;  Surgeon: Evans Lance, MD;  Location: [facility];  Service: Cardiovascular;  Laterality: N/A;  . ESOPHAGOGASTRODUODENOSCOPY (EGD) WITH PROPOFOL N/A 10/09/2016   Dr. Gala Romney: LA grade a esophagitis.  Barrett's esophagus, biopsy-proven.  Small hiatal hernia.  EGD February 2019  . ESOPHAGOGASTRODUODENOSCOPY (EGD) WITH PROPOFOL N/A 11/28/2019    salmon-colored esophageal mucosa (Barrett's) small hiatal hernia, portal  hypertensive gastropathy, normal duodenum, 3 year surveillance  . INCISION / DRAINAGE HAND / FINGER    . LEFT HEART CATH AND CORONARY ANGIOGRAPHY N/A 04/27/2017  Procedure: LEFT HEART CATH AND CORONARY ANGIOGRAPHY;  Surgeon: Nelva Bush, MD;  Location: [facility];  Service: Cardiovascular;  Laterality: N/A;  . LOOP RECORDER INSERTION  06/29/2017   Procedure: Loop Recorder Insertion;  Surgeon: Evans Lance, MD;  Location: [facility];  Service: Cardiovascular;;  . POLYPECTOMY  11/24/2016   Procedure: POLYPECTOMY;  Surgeon: Daneil Dolin, MD;  [facility];  Service: Endoscopy;;  descending and sigmoid  . TOTAL HIP ARTHROPLASTY Right 07/01/2016  . TOTAL HIP ARTHROPLASTY Right 07/01/2016   Procedure: RIGHT TOTAL HIP ARTHROPLASTY ANTERIOR APPROACH;  Surgeon: Mcarthur Rossetti, MD;  Location: [facility];  Service: Orthopedics;  Laterality: Right;     Allergies  Allergen Reactions  . Carvedilol Other (See Comments)    Sinus pause on telemetry >3 seconds. Longest one 9 sec. No AV nodal agent  . Lisinopril Anaphylaxis, Shortness Of Breath and Swelling    Angioedema, required intubation and mechanical ventilation  . Amoxicillin Nausea And Vomiting and Nausea Only    Nausea only - no allergy  Did it involve swelling of the face/tongue/throat, SOB, or low BP? No Did it involve sudden or severe rash/hives, skin peeling, or any reaction on the inside of your mouth or nose? No Did you need to seek medical attention at a hospital or doctor's office? No When did it last happen?childhood reaction If all above answers are "NO", may proceed with cephalosporin use.       Family History  Problem Relation Age of Onset  . Heart attack Father   . Stroke Father   . Arthritis Father   . Heart disease Father   . Cancer Mother        ???  . Arthritis Mother   . Heart disease Brother 37       died in sleep  . Early death Brother   . Diabetes Maternal Uncle   .  Alzheimer's disease Maternal Grandmother      Social History Mr. Ederer reports that he has been smoking cigarettes. He has a 46.00 pack-year smoking history. He has quit using smokeless tobacco.  His smokeless tobacco use included chew. Mr. Capozzoli reports current alcohol use.   Review of Systems CONSTITUTIONAL: No weight loss, fever, chills, weakness or fatigue.  HEENT: Eyes: No visual loss, blurred vision, double vision or yellow sclerae.No hearing loss, sneezing, congestion, runny nose or sore throat.  SKIN: No rash or itching.  CARDIOVASCULAR: per hpi RESPIRATORY: No shortness of breath, cough or sputum.  GASTROINTESTINAL: No anorexia, nausea, vomiting or diarrhea. No abdominal pain or blood.  GENITOURINARY: No burning on urination, no polyuria NEUROLOGICAL: No headache, dizziness, syncope, paralysis, ataxia, numbness or tingling in the extremities. No change in bowel or bladder control.  MUSCULOSKELETAL: No muscle, back pain, joint pain or stiffness.  LYMPHATICS: No enlarged nodes. No history of splenectomy.  PSYCHIATRIC: No history of depression or anxiety.  ENDOCRINOLOGIC: No reports of sweating, cold or heat intolerance. No polyuria or polydipsia.  Marland Kitchen   Physical Examination Today's Vitals   10/11/20 0817  BP: 134/76  Pulse: 84  SpO2: 98%  Weight: 177 lb 6.4 oz (80.5 kg)  Height: 5\' 5"  (1.651 m)   Body mass index is 29.52 kg/m.  Gen: resting comfortably, no acute distress HEENT: no scleral icterus, pupils equal round and reactive, no palptable cervical adenopathy,  CV: RRR, no m/r/g, no jvd Resp: Clear to auscultation bilaterally GI: abdomen is soft, non-tender, non-distended, normal bowel sounds, no hepatosplenomegaly  MSK: extremities are warm, no edema.  Skin: warm, no rash Neuro:  no focal deficits Psych: appropriate affect   Diagnostic Studies 01/2015 cath 1. 1st Diag lesion, 100% stenosed. A Promus Premier 2.5 mm x 16 mm drug-eluting stent was placed. There  is a 0% residual stenosis post intervention. 2. Ramus lesion, 70% stenosed. 3. Mid RCA lesion, 40% stenosed. Dist RCA lesion, 45% stenosed. 4. Mild to moderately reduced LVEF with anterolateral and apical hypokinesis and elevated LVEDP  Abnormal anatomy with equal sized Diagonal and LAD Aida Lemaire but extensive RCA. Successful PCI of the First Diagonal Christop Hippert.  Recommendations:  Standard post radial cath/PCI TR band removal  Dual independent therapy for minimum one year.  Check 2-D echocardiogram to better assess EF  Add statin and beta blocker. Smoking cessation counseling. Cardiac Rehabilitation And Case Management Consultation  If hemodynamic stable, would consider fast-track discharge  Would consider noninvasive evaluation of the Ramus Intermedius lesion in the absence of any recurrent anginal pain.  01/2015 echo Study Conclusions  - Left ventricle: Moderately severe hypokinesis of mid anterolateral segment, apical lateral segment, mid/apical anterior segments. EF is 40%. The cavity size was normal. Wall thickness was increased in a pattern of mild LVH. - Aortic valve: Sclerosis without stenosis. There was no significant regurgitation. - Right ventricle: The cavity size was normal. Systolic function was normal.  07/2015 CT PE IMPRESSION: 1. Suboptimal contrast bolus timing in the pulmonary arterial tree, poor in the lobar vessels other than the right lower lobe. No central pulmonary embolus. If there is continued suspicion of acute PE nuclear medicine V/Q scan may be most helpful. 2. Situs abnormality in the chest with right side aortic arch and descending aorta. Mirror image branching. Other visible situs including cardiac and upper abdominal situs appear normal. 3. Calcified Coronary artery atherosclerosis. Hepatomegaly with fatty liver disease.  07/2015 PFTs: mild to mod ventilatory defect with small airway obstruction, moderately reduced DLCO.    07/2018 nuclear stress  There was no ST segment deviation noted during stress.  Defect 1: There is a medium defect of moderate severity present in the mid anterior, mid anterolateral and apical anterior location.  Findings consistent with prior myocardial infarction.  This is an intermediate risk study.  Nuclear stress EF: 53%.     Assessment and Plan  1. CAD -no symptoms, continue current meds   2. Hyperlipideima -reasonable control given limitations on statin dosing given prior elevation of LFTs - we will continue current statin, with recent significant weight loss upcoming labs likely to be improved   3. HTN - at goal, continue current meds  4. History of syncope/bradycardia -no recent symptoms - loop recorder monitored by EP - mild orthostatic symptoms at times, encouraged increased oral hydration. Does not use his lasix much at all, on chlorthalidone 12.5mg  daily for bp. If progresses may decrease bp meds  EKG today shows SR, RBBB     Arnoldo Lenis, M.D.

## 2020-10-12 ENCOUNTER — Ambulatory Visit (INDEPENDENT_AMBULATORY_CARE_PROVIDER_SITE_OTHER): Payer: Medicare Other

## 2020-10-12 DIAGNOSIS — J309 Allergic rhinitis, unspecified: Secondary | ICD-10-CM

## 2020-10-12 LAB — COMPREHENSIVE METABOLIC PANEL
ALT: 30 IU/L (ref 0–44)
AST: 48 IU/L — ABNORMAL HIGH (ref 0–40)
Albumin/Globulin Ratio: 1.5 (ref 1.2–2.2)
Albumin: 4.4 g/dL (ref 3.8–4.9)
Alkaline Phosphatase: 169 IU/L — ABNORMAL HIGH (ref 44–121)
BUN/Creatinine Ratio: 10 (ref 9–20)
BUN: 6 mg/dL (ref 6–24)
Bilirubin Total: 1 mg/dL (ref 0.0–1.2)
CO2: 24 mmol/L (ref 20–29)
Calcium: 9.4 mg/dL (ref 8.7–10.2)
Chloride: 99 mmol/L (ref 96–106)
Creatinine, Ser: 0.62 mg/dL — ABNORMAL LOW (ref 0.76–1.27)
GFR calc Af Amer: 129 mL/min/{1.73_m2} (ref >59)
GFR calc non Af Amer: 112 mL/min/{1.73_m2} (ref >59)
Globulin, Total: 3 g/dL (ref 1.5–4.5)
Glucose: 126 mg/dL — ABNORMAL HIGH (ref 65–99)
Potassium: 3.9 mmol/L (ref 3.5–5.2)
Sodium: 137 mmol/L (ref 134–144)
Total Protein: 7.4 g/dL (ref 6.0–8.5)

## 2020-10-13 ENCOUNTER — Other Ambulatory Visit: Payer: Self-pay | Admitting: "Endocrinology

## 2020-10-16 ENCOUNTER — Encounter: Payer: Self-pay | Admitting: Internal Medicine

## 2020-10-16 ENCOUNTER — Ambulatory Visit (INDEPENDENT_AMBULATORY_CARE_PROVIDER_SITE_OTHER): Payer: Medicare Other | Admitting: Internal Medicine

## 2020-10-16 ENCOUNTER — Ambulatory Visit: Payer: Medicare Other | Admitting: Gastroenterology

## 2020-10-16 ENCOUNTER — Other Ambulatory Visit: Payer: Self-pay

## 2020-10-16 VITALS — BP 122/68 | HR 84 | Ht 65.0 in | Wt 183.2 lb

## 2020-10-16 DIAGNOSIS — R55 Syncope and collapse: Secondary | ICD-10-CM

## 2020-10-16 DIAGNOSIS — I2510 Atherosclerotic heart disease of native coronary artery without angina pectoris: Secondary | ICD-10-CM

## 2020-10-16 DIAGNOSIS — I10 Essential (primary) hypertension: Secondary | ICD-10-CM

## 2020-10-16 LAB — CUP PACEART INCLINIC DEVICE CHECK
Date Time Interrogation Session: 20220215091500
Implantable Pulse Generator Implant Date: 20181029

## 2020-10-16 NOTE — Progress Notes (Signed)
HPI Randy Hebert returns today for followup. He is a pleasant morbidly obese man with a h/o syncope, s/p ILR insertion. He has had some dizzy spells but was taking too much diuretic. He has stopped drinking and using cocaine. His son overdosed. He admits to some dietary indiscretion. His girlfriend of 6 years diet. He has not had syncope. He has started heating with wood and is cutting wood to burn in his stove. He denies chest pain or sob.  Allergies  Allergen Reactions  . Carvedilol Other (See Comments)    Sinus pause on telemetry >3 seconds. Longest one 9 sec. No AV nodal agent  . Lisinopril Anaphylaxis, Shortness Of Breath and Swelling    Angioedema, required intubation and mechanical ventilation  . Amoxicillin Nausea And Vomiting and Nausea Only    Nausea only - no allergy  Did it involve swelling of the face/tongue/throat, SOB, or low BP? No Did it involve sudden or severe rash/hives, skin peeling, or any reaction on the inside of your mouth or nose? No Did you need to seek medical attention at a hospital or doctor's office? No When did it last happen?childhood reaction If all above answers are "NO", may proceed with cephalosporin use.      Current Outpatient Medications  Medication Sig Dispense Refill  . acetaminophen (TYLENOL) 500 MG tablet Take 1,000 mg by mouth every 4 (four) hours as needed for mild pain or fever.     Marland Kitchen albuterol (PROVENTIL) (2.5 MG/3ML) 0.083% nebulizer solution Take 3 mLs (2.5 mg total) by nebulization every 6 (six) hours as needed for wheezing or shortness of breath. 150 mL 1  . amLODipine (NORVASC) 10 MG tablet TAKE 1 TABLET BY MOUTH ONCE A DAY. (Patient taking differently: Take 10 mg by mouth daily.) 30 tablet 0  . ascorbic acid (VITAMIN C) 500 MG tablet Take 1 tablet (500 mg total) by mouth daily. (Patient taking differently: Take 500 mg by mouth every evening.) 30 tablet 1  . aspirin 81 MG EC tablet Take 1 tablet (81 mg total) by mouth daily.  (Patient taking differently: Take 81 mg by mouth 2 (two) times daily.) 30 tablet   . atorvastatin (LIPITOR) 40 MG tablet Take 1 tablet (40 mg total) by mouth every evening. 30 tablet 6  . azelastine (ASTELIN) 0.1 % nasal spray USE 2 SPRAYS IN EACH NOSTRIL TWICE DAILY. 30 mL 5  . Carboxymeth-Glycerin-Polysorb 0.5-1-0.5 % SOLN Apply 1 drop to eye 2 (two) times daily as needed (dry eyes).     . chlorthalidone (HYGROTON) 25 MG tablet Take 0.5 tablets (12.5 mg total) by mouth daily. 45 tablet 1  . Cholecalciferol (VITAMIN D3) 125 MCG (5000 UT) CAPS Take 1 capsule (5,000 Units total) by mouth daily. (Patient taking differently: Take 5,000 Units by mouth daily in the afternoon.) 90 capsule 0  . citalopram (CELEXA) 20 MG tablet Take 20 mg by mouth daily.    . cycloSPORINE (RESTASIS) 0.05 % ophthalmic emulsion Place 1 drop into both eyes 2 (two) times daily.    . ferrous sulfate 325 (65 FE) MG tablet Take 325 mg by mouth daily with breakfast.    . furosemide (LASIX) 40 MG tablet Take 1 tablet (40 mg total) by mouth daily as needed for fluid. 30 tablet 11  . glipiZIDE (GLUCOTROL XL) 5 MG 24 hr tablet TAKE 1 TABLET BY MOUTH DAILY WITH BREAKFAST. 30 tablet 0  . glucose blood (ACCU-CHEK AVIVA PLUS) test strip Use as instructed to check  blood glucose once daily, DX E11.59 100 each 12  . magnesium gluconate (MAGONATE) 500 MG tablet Take 500 mg by mouth 2 (two) times daily.    . metFORMIN (GLUCOPHAGE) 1000 MG tablet Take 1 tablet (1,000 mg total) by mouth 2 (two) times daily with a meal. 180 tablet 1  . mometasone (ELOCON) 0.1 % cream Apply 1 application topically 2 (two) times a week.     . nitroGLYCERIN (NITROSTAT) 0.4 MG SL tablet Place 1 tablet (0.4 mg total) under the tongue every 5 (five) minutes as needed for chest pain. 25 tablet 3  . pantoprazole (PROTONIX) 40 MG tablet TAKE 1 TABLET BY MOUTH ONCE A DAY. 90 tablet 3  . potassium chloride SA (KLOR-CON) 20 MEQ tablet Take 2 tablets (40 mEq total) by mouth  daily. 180 tablet 1  . prednisoLONE acetate (PRED FORTE) 1 % ophthalmic suspension Place 4 drops into both eyes 2 (two) times a week.     Marland Kitchen PROVENTIL HFA 108 (90 Base) MCG/ACT inhaler INHALE 2 PUFFS INTO THE LUNGS EVERY 6 HOURS AS NEEDED FOR SHORTNESS OF BREATH OR WHEEZING. (Patient taking differently: Inhale 2 puffs into the lungs every 6 (six) hours as needed for wheezing or shortness of breath.) 18 g 1  . SPIRIVA RESPIMAT 2.5 MCG/ACT AERS INHALE 1 PUFF INTO THE LUNGS DAILY. 4 g 0  . SYMBICORT 160-4.5 MCG/ACT inhaler INHALE 2 PUFFS INTO THE LUNGS TWO TIMES DAILY. 10.2 g 0   No current facility-administered medications for this visit.     Past Medical History:  Diagnosis Date  . ACE inhibitor-aggravated angioedema   . Allergy   . Angio-edema   . Anxiety   . Arthritis   . Asthma    hip replacement  . Back pain   . Bradycardia 04/28/2017  . Bulging of cervical intervertebral disc   . CAD (coronary artery disease)    lateral STEMI 02/06/2015 00% D1 occlusion treated with Promus Premier 2.5 mm x 16 mm DES, 70% ramus stenosis, 40% mid RCA stenosis, 45% distal RCA stenosis, EF 45-50%  . CHF (congestive heart failure) ([place])   . COPD (chronic obstructive pulmonary disease) ([place])   . Depression   . Diabetes mellitus without complication ([place])   . 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 Dry eye   . Dyspnea   . Early satiety 09/23/2016  . GERD (gastroesophageal reflux disease)   . HCAP (healthcare-associated pneumonia) 05/15/2017  . Headache   . Heart murmur   . Hip pain   . Hyperlipidemia   . Hypertension   . Lobar pneumonia ([place]) 05/15/2017  . Melena 08/04/2018  . MI (myocardial infarction) ([place])   . Myocardial infarction ([place])   . Neck pain   . Non-ST elevation (NSTEMI) myocardial infarction ([place]) 04/27/2017  . NSTEMI (non-ST elevated myocardial infarction) ([place]) 04/26/2017  . Otitis media   . Pleurisy   . Pneumonia due to COVID-19 virus  10/07/2019  . Rectal bleeding 11/08/2018  . Right shoulder pain 03/20/2020  . Sinus pause    9 sec sinus pause on telemetry after started on coreg after MI, avoid AV nodal blocking agent  . Sleep apnea   . 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 STEMI (ST elevation myocardial infarction) ([place]) 05/31/2015  . 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 Syncope 06/29/2017  . Syncope and collapse 05/15/2017  . Transaminitis 08/04/2018  . Unilateral primary osteoarthritis, right hip  07/01/2016    ROS:   All systems reviewed and negative except as noted in the HPI.   Past Surgical History:  Procedure Laterality Date  . BIOPSY  10/09/2016   Procedure: BIOPSY;  Surgeon: Daneil Dolin, MD;  [facility];  Service: Endoscopy;;  . BIOPSY  11/28/2019   Procedure: BIOPSY;  Surgeon: Daneil Dolin, MD;  [facility];  Service: Endoscopy;;  . CARDIAC CATHETERIZATION N/A 02/06/2015   Procedure: Left Heart Cath and Coronary Angiography;  Surgeon: Leonie Man, MD;  Location: [facility];  Service: Cardiovascular;  Laterality: N/A;  . CARDIAC CATHETERIZATION N/A 02/06/2015   Procedure: Coronary Stent Intervention;  Surgeon: Leonie Man, MD;  Location: [facility];  Service: Cardiovascular;  Laterality: N/A;  . COLONOSCOPY WITH PROPOFOL N/A 10/09/2016   Sigmoid and descending colon diverticulosis, four 4-6 mm polyps in sigmoid, one 4 mm polyp in descending. Tubular adenomas and hyperplastic. 5 year surveillance.   . COLONOSCOPY WITH PROPOFOL N/A 11/24/2016   Sigmoid and descending colon diverticulosis, four 4-6 mm polyps in sigmoid, one 4 mm polyp in descending. Tubular adenomas and hyperplastic. 5 year surveillance.   . CORONARY ANGIOPLASTY WITH STENT PLACEMENT  01/2015  . CORONARY STENT INTERVENTION N/A 04/27/2017   Procedure: CORONARY STENT INTERVENTION;  Surgeon: Nelva Bush, MD;  Location: [facility];  Service: Cardiovascular;  Laterality: N/A;   . ELECTROPHYSIOLOGY STUDY N/A 06/29/2017   Procedure: ELECTROPHYSIOLOGY STUDY;  Surgeon: Evans Lance, MD;  Location: [facility];  Service: Cardiovascular;  Laterality: N/A;  . ESOPHAGOGASTRODUODENOSCOPY (EGD) WITH PROPOFOL N/A 10/09/2016   Dr. Gala Romney: LA grade a esophagitis.  Barrett's esophagus, biopsy-proven.  Small hiatal hernia.  EGD February 2019  . ESOPHAGOGASTRODUODENOSCOPY (EGD) WITH PROPOFOL N/A 11/28/2019    salmon-colored esophageal mucosa (Barrett's) small hiatal hernia, portal hypertensive gastropathy, normal duodenum, 3 year surveillance  . INCISION / DRAINAGE HAND / FINGER    . LEFT HEART CATH AND CORONARY ANGIOGRAPHY N/A 04/27/2017   Procedure: LEFT HEART CATH AND CORONARY ANGIOGRAPHY;  Surgeon: Nelva Bush, MD;  Location: [facility];  Service: Cardiovascular;  Laterality: N/A;  . LOOP RECORDER INSERTION  06/29/2017   Procedure: Loop Recorder Insertion;  Surgeon: Evans Lance, MD;  Location: [facility];  Service: Cardiovascular;;  . POLYPECTOMY  11/24/2016   Procedure: POLYPECTOMY;  Surgeon: Daneil Dolin, MD;  [facility];  Service: Endoscopy;;  descending and sigmoid  . TOTAL HIP ARTHROPLASTY Right 07/01/2016  . TOTAL HIP ARTHROPLASTY Right 07/01/2016   Procedure: RIGHT TOTAL HIP ARTHROPLASTY ANTERIOR APPROACH;  Surgeon: Mcarthur Rossetti, MD;  Location: [facility];  Service: Orthopedics;  Laterality: Right;     Family History  Problem Relation Age of Onset  . Heart attack Father   . Stroke Father   . Arthritis Father   . Heart disease Father   . Cancer Mother        ???  . Arthritis Mother   . Heart disease Brother 26       died in sleep  . Early death Brother   . Diabetes Maternal Uncle   . Alzheimer's disease Maternal Grandmother      Social History   Socioeconomic History  . Marital status: Divorced    Spouse name: Randy Hebert  . Number of children: 3  . Years of education: 56  . Highest education level: Not on  file  Occupational History  . Occupation:  [employer]  Tobacco Use  . Smoking status: Current Every Day Smoker    Packs/day: 1.00    Years: 46.00    Pack years: 46.00    Types: Cigarettes  . Smokeless tobacco: Former Systems developer    Types: Secondary school teacher  . Vaping Use: Former  Substance and Sexual Activity  . Alcohol use: Yes    Alcohol/week: 0.0 standard drinks    Comment: half a pint every other day  . Drug use: No    Comment: history of drug use- marijuana, cocaine- quit 2014  . Sexual activity: Yes    Partners: Female    Birth control/protection: None  Other Topics Concern  . Not on file  Social History Narrative   Lives with girlfriend Randy Hebert   3 children, but 1 OD  In 2019.   Grandchildren-2      Two cats: may and princess; and a dog-foxy      Enjoy: spending time with pets, TV, yard work       Diet: eats all food groups   Caffeine: half and half coffee, some tea-half and half decaf   Water: 6-8 cups daily      Wears seat belt   Does not use phone while driving   Oceanographer at home      Social Determinants of Health   Financial Resource Strain: Low Risk   . Difficulty of Paying Living Expenses: Not hard at all  Food Insecurity: No Food Insecurity  . Worried About Charity fundraiser in the Last Year: Never true  . Ran Out of Food in the Last Year: Never true  Transportation Needs: No Transportation Needs  . Lack of Transportation (Medical): No  . Lack of Transportation (Non-Medical): No  Physical Activity: Insufficiently Active  . Days of Exercise per Week: 7 days  . Minutes of Exercise per Session: 20 min  Stress: No Stress Concern Present  . Feeling of Stress : Not at all  Social Connections: Moderately Integrated  . Frequency of Communication with Friends and Family: More than three times a week  . Frequency of Social Gatherings with Friends and Family: More than three times a week  . Attends Religious Services: More than 4 times per year  . Active Member  of Clubs or Organizations: No  . Attends Archivist Meetings: Never  . Marital Status: Living with partner  Intimate Partner Violence: Not At Risk  . Fear of Current or Ex-Partner: No  . Emotionally Abused: No  . Physically Abused: No  . Sexually Abused: No     BP 122/68   Pulse 84   Ht 5\' 5"  (1.651 m)   Wt 183 lb 3.2 oz (83.1 kg)   SpO2 96%   BMI 30.49 kg/m   Physical Exam:  Well appearing NAD HEENT: Unremarkable Neck:  No JVD, no thyromegally Lymphatics:  No adenopathy Back:  No CVA tenderness Lungs:  Clear with no wheezes HEART:  Regular rate rhythm, no murmurs, no rubs, no clicks Abd:  soft, positive bowel sounds, no organomegally, no rebound, no guarding Ext:  2 plus pulses, no edema, no cyanosis, no clubbing Skin:  No rashes no nodules Neuro:  CN II through XII intact, motor grossly intact  DEVICE  Normal device function.  See PaceArt for details. RRT. No arrhythmias on his loop.  Assess/Plan: 1. Syncope - he is s/p ILR and has done well with no arrhythmias. He has gone over 6 months without an episode. He  is not interested in having his ILR removed. 2. Obesity - he has lost weight. 3. HTN - his bp is back to normal with weight loss and medical therapy. 4. CAD - he is very active and denies anginal symptoms.   Carleene Overlie Tocarra Gassen,MD

## 2020-10-16 NOTE — Patient Instructions (Signed)
Medication Instructions:  °Your physician recommends that you continue on your current medications as directed. Please refer to the Current Medication list given to you today. ° °*If you need a refill on your cardiac medications before your next appointment, please call your pharmacy* ° ° °Lab Work: °NONE  ° °If you have labs (blood work) drawn today and your tests are completely normal, you will receive your results only by: °MyChart Message (if you have MyChart) OR °A paper copy in the mail °If you have any lab test that is abnormal or we need to change your treatment, we will call you to review the results. ° ° °Testing/Procedures: °NONE  ° ° °Follow-Up: °At [facility], you and your health needs are our priority.  As part of our continuing mission to provide you with exceptional heart care, we have created designated Provider Care Teams.  These Care Teams include your primary Cardiologist (physician) and Advanced Practice Providers (APPs -  Physician Assistants and Nurse Practitioners) who all work together to provide you with the care you need, when you need it. ° °We recommend signing up for the patient portal called "MyChart".  Sign up information is provided on this After Visit Summary.  MyChart is used to connect with patients for Virtual Visits (Telemedicine).  Patients are able to view lab/test results, encounter notes, upcoming appointments, etc.  Non-urgent messages can be sent to your provider as well.   °To learn more about what you can do with MyChart, go to [email or web address].   ° °Your next appointment:   ° As Needed  ° °The format for your next appointment:   °In Person ° °Provider:   °Gregg Taylor, MD  ° ° °Other Instructions °Thank you for choosing [facility]! °  ° ° °

## 2020-10-17 ENCOUNTER — Encounter: Payer: Self-pay | Admitting: "Endocrinology

## 2020-10-17 ENCOUNTER — Ambulatory Visit: Payer: Medicare Other | Admitting: Family Medicine

## 2020-10-17 ENCOUNTER — Encounter: Payer: Self-pay | Admitting: Nurse Practitioner

## 2020-10-17 ENCOUNTER — Ambulatory Visit: Payer: Medicare Other | Admitting: Cardiology

## 2020-10-17 ENCOUNTER — Ambulatory Visit (INDEPENDENT_AMBULATORY_CARE_PROVIDER_SITE_OTHER): Payer: Medicare Other | Admitting: "Endocrinology

## 2020-10-17 ENCOUNTER — Ambulatory Visit (INDEPENDENT_AMBULATORY_CARE_PROVIDER_SITE_OTHER): Payer: Medicare Other | Admitting: Nurse Practitioner

## 2020-10-17 VITALS — BP 127/73 | HR 85 | Ht 65.0 in | Wt 183.4 lb

## 2020-10-17 VITALS — BP 133/75 | HR 85 | Temp 98.6°F | Resp 20 | Ht 65.0 in | Wt 183.0 lb

## 2020-10-17 DIAGNOSIS — E1159 Type 2 diabetes mellitus with other circulatory complications: Secondary | ICD-10-CM

## 2020-10-17 DIAGNOSIS — J449 Chronic obstructive pulmonary disease, unspecified: Secondary | ICD-10-CM

## 2020-10-17 DIAGNOSIS — I10 Essential (primary) hypertension: Secondary | ICD-10-CM

## 2020-10-17 DIAGNOSIS — E782 Mixed hyperlipidemia: Secondary | ICD-10-CM

## 2020-10-17 DIAGNOSIS — F17219 Nicotine dependence, cigarettes, with unspecified nicotine-induced disorders: Secondary | ICD-10-CM

## 2020-10-17 LAB — POCT GLYCOSYLATED HEMOGLOBIN (HGB A1C): HbA1c, POC (controlled diabetic range): 6.1 % (ref 0.0–7.0)

## 2020-10-17 NOTE — Assessment & Plan Note (Signed)
-  no interest in quitting today

## 2020-10-17 NOTE — Assessment & Plan Note (Signed)
-  has wheezing on exam today, but he states he feels great and has been well controlled -continue albuterol and spiriva

## 2020-10-17 NOTE — Assessment & Plan Note (Signed)
-  will check with next set of fasting labs

## 2020-10-17 NOTE — Assessment & Plan Note (Signed)
-  BP well controlled today -no change to meds 

## 2020-10-17 NOTE — Patient Instructions (Signed)

## 2020-10-17 NOTE — Progress Notes (Signed)
10/17/2020, 3:01 PM      Endocrinology follow-up note    Subjective:    Patient ID: Randy Hebert, male    DOB: 02/21/1966.  Randy Hebert is being seen in  follow-up for management of currently uncontrolled symptomatic type 2 diabetes, hyperlipidemia, hypertension. PMD:  Perlie Mayo, NP.   Past Medical History:  Diagnosis Date  . ACE inhibitor-aggravated angioedema   . Allergy   . Angio-edema   . Anxiety   . Arthritis   . Asthma    hip replacement  . Back pain   . Bradycardia 04/28/2017  . Bulging of cervical intervertebral disc   . CAD (coronary artery disease)    lateral STEMI 02/06/2015 00% D1 occlusion treated with Promus Premier 2.5 mm x 16 mm DES, 70% ramus stenosis, 40% mid RCA stenosis, 45% distal RCA stenosis, EF 45-50%  . CHF (congestive heart failure) ([place])   . COPD (chronic obstructive pulmonary disease) ([place])   . Depression   . Diabetes mellitus without complication ([place])   . 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 Dry eye   . Dyspnea   . Early satiety 09/23/2016  . GERD (gastroesophageal reflux disease)   . HCAP (healthcare-associated pneumonia) 05/15/2017  . Headache   . Heart murmur   . Hip pain   . Hyperlipidemia   . Hypertension   . Lobar pneumonia ([place]) 05/15/2017  . Melena 08/04/2018  . MI (myocardial infarction) ([place])   . Myocardial infarction ([place])   . Neck pain   . Non-ST elevation (NSTEMI) myocardial infarction ([place]) 04/27/2017  . NSTEMI (non-ST elevated myocardial infarction) ([place]) 04/26/2017  . Otitis media   . Pleurisy   . Pneumonia due to COVID-19 virus 10/07/2019  . Rectal bleeding 11/08/2018  . Right shoulder pain 03/20/2020  . Sinus pause    9 sec sinus pause on telemetry after started on coreg after MI, avoid AV nodal blocking agent  . Sleep apnea   . 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 STEMI (ST elevation myocardial infarction) ([place])  05/31/2015  . 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 Syncope 06/29/2017  . Syncope and collapse 05/15/2017  . Transaminitis 08/04/2018  . Unilateral primary osteoarthritis, right hip 07/01/2016   Past Surgical History:  Procedure Laterality Date  . BIOPSY  10/09/2016   Procedure: BIOPSY;  Surgeon: Daneil Dolin, MD;  [facility];  Service: Endoscopy;;  . BIOPSY  11/28/2019   Procedure: BIOPSY;  Surgeon: Daneil Dolin, MD;  [facility];  Service: Endoscopy;;  . CARDIAC CATHETERIZATION N/A 02/06/2015   Procedure: Left Heart Cath and Coronary Angiography;  Surgeon: Leonie Man, MD;  Location: [facility];  Service: Cardiovascular;  Laterality: N/A;  . CARDIAC CATHETERIZATION N/A 02/06/2015   Procedure: Coronary Stent Intervention;  Surgeon: Leonie Man, MD;  Location: [facility];  Service: Cardiovascular;  Laterality: N/A;  . COLONOSCOPY WITH PROPOFOL N/A 10/09/2016   Sigmoid and descending colon diverticulosis, four 4-6 mm polyps in sigmoid, one 4 mm polyp in descending. Tubular adenomas and hyperplastic. 5 year surveillance.   . COLONOSCOPY WITH PROPOFOL N/A 11/24/2016   Sigmoid and descending colon diverticulosis, four 4-6 mm polyps in sigmoid, one 4 mm polyp in descending. Tubular adenomas and hyperplastic. 5 year surveillance.   . CORONARY ANGIOPLASTY WITH STENT PLACEMENT  01/2015  .  CORONARY STENT INTERVENTION N/A 04/27/2017   Procedure: CORONARY STENT INTERVENTION;  Surgeon: Nelva Bush, MD;  Location: [facility];  Service: Cardiovascular;  Laterality: N/A;  . ELECTROPHYSIOLOGY STUDY N/A 06/29/2017   Procedure: ELECTROPHYSIOLOGY STUDY;  Surgeon: Evans Lance, MD;  Location: [facility];  Service: Cardiovascular;  Laterality: N/A;  . ESOPHAGOGASTRODUODENOSCOPY (EGD) WITH PROPOFOL N/A 10/09/2016   Dr. Gala Romney: LA grade a esophagitis.  Barrett's esophagus, biopsy-proven.  Small hiatal hernia.  EGD February 2019  .  ESOPHAGOGASTRODUODENOSCOPY (EGD) WITH PROPOFOL N/A 11/28/2019    salmon-colored esophageal mucosa (Barrett's) small hiatal hernia, portal hypertensive gastropathy, normal duodenum, 3 year surveillance  . INCISION / DRAINAGE HAND / FINGER    . LEFT HEART CATH AND CORONARY ANGIOGRAPHY N/A 04/27/2017   Procedure: LEFT HEART CATH AND CORONARY ANGIOGRAPHY;  Surgeon: Nelva Bush, MD;  Location: [facility];  Service: Cardiovascular;  Laterality: N/A;  . LOOP RECORDER INSERTION  06/29/2017   Procedure: Loop Recorder Insertion;  Surgeon: Evans Lance, MD;  Location: [facility];  Service: Cardiovascular;;  . POLYPECTOMY  11/24/2016   Procedure: POLYPECTOMY;  Surgeon: Daneil Dolin, MD;  [facility];  Service: Endoscopy;;  descending and sigmoid  . TOTAL HIP ARTHROPLASTY Right 07/01/2016  . TOTAL HIP ARTHROPLASTY Right 07/01/2016   Procedure: RIGHT TOTAL HIP ARTHROPLASTY ANTERIOR APPROACH;  Surgeon: Mcarthur Rossetti, MD;  Location: [facility];  Service: Orthopedics;  Laterality: Right;   Social History   Socioeconomic History  . Marital status: Divorced    Spouse name: alice  . Number of children: 3  . Years of education: 32  . Highest education level: Not on file  Occupational History  . Occupation: SSI  Tobacco Use  . Smoking status: Current Every Day Smoker    Packs/day: 1.00    Years: 46.00    Pack years: 46.00    Types: Cigarettes  . Smokeless tobacco: Former Systems developer    Types: Secondary school teacher  . Vaping Use: Former  Substance and Sexual Activity  . Alcohol use: Yes    Alcohol/week: 0.0 standard drinks    Comment: half a pint every other day  . Drug use: No    Comment: history of drug use- marijuana, cocaine- quit 2014  . Sexual activity: Yes    Partners: Female    Birth control/protection: None  Other Topics Concern  . Not on file  Social History Narrative   Lives with girlfriend [name]   3 children, but 1 OD  In 2019.   Grandchildren-2       Two cats: may and princess; and a dog-foxy      Enjoy: spending time with pets, TV, yard work       Diet: eats all food groups   Caffeine: half and half coffee, some tea-half and half decaf   Water: 6-8 cups daily      Wears seat belt   Does not use phone while driving   Oceanographer at home      Social Determinants of Health   Financial Resource Strain: Low Risk   . Difficulty of Paying Living Expenses: Not hard at all  Food Insecurity: No Food Insecurity  . Worried About Charity fundraiser in the Last Year: Never true  . Ran Out of Food in the Last Year: Never true  Transportation Needs: No Transportation Needs  . Lack of Transportation (Medical): No  . Lack of Transportation (Non-Medical): No  Physical Activity: Insufficiently Active  . Days of Exercise per Week: 7 days  . Minutes of Exercise per Session: 20 min  Stress: No Stress Concern Present  . Feeling of Stress : Not at all  Social Connections: Moderately Integrated  . Frequency of Communication with Friends and Family: More than three times a week  . Frequency of Social Gatherings with Friends and Family: More than three times a week  . Attends Religious Services: More than 4 times per year  . Active Member of Clubs or Organizations: No  . Attends Archivist Meetings: Never  . Marital Status: Living with partner   Outpatient Encounter Medications as of 10/17/2020  Medication Sig  . acetaminophen (TYLENOL) 500 MG tablet Take 1,000 mg by mouth every 4 (four) hours as needed for mild pain or fever.   Marland Kitchen albuterol (PROVENTIL) (2.5 MG/3ML) 0.083% nebulizer solution Take 3 mLs (2.5 mg total) by nebulization every 6 (six) hours as needed for wheezing or shortness of breath.  Marland Kitchen amLODipine (NORVASC) 10 MG tablet TAKE 1 TABLET BY MOUTH ONCE A DAY. (Patient taking differently: Take 10 mg by mouth daily.)  . ascorbic acid (VITAMIN C) 500 MG tablet Take 1 tablet (500 mg total) by mouth daily. (Patient taking  differently: Take 500 mg by mouth every evening.)  . aspirin 81 MG EC tablet Take 1 tablet (81 mg total) by mouth daily. (Patient taking differently: Take 81 mg by mouth 2 (two) times daily.)  . atorvastatin (LIPITOR) 40 MG tablet Take 1 tablet (40 mg total) by mouth every evening.  Marland Kitchen azelastine (ASTELIN) 0.1 % nasal spray USE 2 SPRAYS IN EACH NOSTRIL TWICE DAILY.  . Carboxymeth-Glycerin-Polysorb 0.5-1-0.5 % SOLN Apply 1 drop to eye 2 (two) times daily as needed (dry eyes).   . chlorthalidone (HYGROTON) 25 MG tablet Take 0.5 tablets (12.5 mg total) by mouth daily.  . Cholecalciferol (VITAMIN D3) 125 MCG (5000 UT) CAPS Take 1 capsule (5,000 Units total) by mouth daily. (Patient taking differently: Take 5,000 Units by mouth daily in the afternoon.)  . citalopram (CELEXA) 20 MG tablet Take 20 mg by mouth daily.  . cycloSPORINE (RESTASIS) 0.05 % ophthalmic emulsion Place 1 drop into both eyes 2 (two) times daily.  . ferrous sulfate 325 (65 FE) MG tablet Take 325 mg by mouth daily with breakfast.  . furosemide (LASIX) 40 MG tablet Take 1 tablet (40 mg total) by mouth daily as needed for fluid.  Marland Kitchen glipiZIDE (GLUCOTROL XL) 5 MG 24 hr tablet TAKE 1 TABLET BY MOUTH DAILY WITH BREAKFAST.  Marland Kitchen glucose blood (ACCU-CHEK AVIVA PLUS) test strip Use as instructed to check blood glucose once daily, DX E11.59  . magnesium gluconate (MAGONATE) 500 MG tablet Take 500 mg by mouth 2 (two) times daily.  . metFORMIN (GLUCOPHAGE) 1000 MG tablet Take 1 tablet (1,000 mg total) by mouth 2 (two) times daily with a meal.  . mometasone (ELOCON) 0.1 % cream Apply 1 application topically 2 (two) times a week.   . nitroGLYCERIN (NITROSTAT) 0.4 MG SL tablet Place 1 tablet (0.4 mg total) under the tongue every 5 (five) minutes as needed for chest pain.  . pantoprazole (PROTONIX) 40 MG tablet TAKE 1 TABLET BY MOUTH ONCE A DAY.  Marland Kitchen potassium chloride SA (KLOR-CON) 20 MEQ tablet Take 2 tablets (40 mEq total) by mouth daily.  .  prednisoLONE acetate (PRED FORTE) 1 % ophthalmic suspension Place 4 drops into both eyes 2 (two) times a week.   Marland Kitchen PROVENTIL  HFA 108 (90 Base) MCG/ACT inhaler INHALE 2 PUFFS INTO THE LUNGS EVERY 6 HOURS AS NEEDED FOR SHORTNESS OF BREATH OR WHEEZING. (Patient taking differently: Inhale 2 puffs into the lungs every 6 (six) hours as needed for wheezing or shortness of breath.)  . SPIRIVA RESPIMAT 2.5 MCG/ACT AERS INHALE 1 PUFF INTO THE LUNGS DAILY.  . SYMBICORT 160-4.5 MCG/ACT inhaler INHALE 2 PUFFS INTO THE LUNGS TWO TIMES DAILY.   No facility-administered encounter medications on file as of 10/17/2020.    ALLERGIES: Allergies  Allergen Reactions  . Carvedilol Other (See Comments)    Sinus pause on telemetry >3 seconds. Longest one 9 sec. No AV nodal agent  . Lisinopril Anaphylaxis, Shortness Of Breath and Swelling    Angioedema, required intubation and mechanical ventilation  . Amoxicillin Nausea And Vomiting and Nausea Only    Nausea only - no allergy  Did it involve swelling of the face/tongue/throat, SOB, or low BP? No Did it involve sudden or severe rash/hives, skin peeling, or any reaction on the inside of your mouth or nose? No Did you need to seek medical attention at a hospital or doctor's office? No When did it last happen?childhood reaction If all above answers are "NO", may proceed with cephalosporin use.     VACCINATION STATUS: Immunization History  Administered Date(s) Administered  . Influenza,inj,Quad PF,6+ Mos 07/03/2016, 05/07/2017, 06/20/2020  . Influenza-Unspecified 05/17/2018  . Moderna Sars-Covid-2 Vaccination 02/20/2020  . Pneumococcal Conjugate-13 06/17/2017  . Pneumococcal Polysaccharide-23 02/07/2015  . Tdap 05/17/2019    Diabetes He presents for his follow-up diabetic visit. He has type 2 diabetes mellitus. Onset time: He was diagnosed at approximate age of 18 years. His disease course has been fluctuating. There are no hypoglycemic associated  symptoms. Pertinent negatives for hypoglycemia include no confusion, headaches, pallor or seizures. Pertinent negatives for diabetes include no chest pain, no fatigue, no polydipsia, no polyphagia, no polyuria and no weakness. There are no hypoglycemic complications. Symptoms are improving. Diabetic complications include heart disease. Risk factors for coronary artery disease include dyslipidemia, diabetes mellitus, hypertension, male sex, family history, obesity, sedentary lifestyle and tobacco exposure. Current diabetic treatment includes oral agent (monotherapy). He is compliant with treatment most of the time. His weight is fluctuating minimally. He is following a generally unhealthy diet. When asked about meal planning, he reported none. He has had a previous visit with a dietitian. He never participates in exercise. His home blood glucose trend is decreasing steadily. (He presents with no meter nor logs.  His point-of-care A1c is 6.1%, overall improving from 7.5%.  He reports lowest blood glucose result was 101.  ) An ACE inhibitor/angiotensin II receptor blocker is contraindicated (He has documented allergy for ACE inhibitor's). He does not see a podiatrist.Eye exam is not current.  Hyperlipidemia This is a chronic problem. The current episode started more than 1 year ago. Exacerbating diseases include diabetes and obesity. Pertinent negatives include no chest pain, myalgias or shortness of breath. Current antihyperlipidemic treatment includes statins. Risk factors for coronary artery disease include dyslipidemia, diabetes mellitus, family history, obesity, male sex, hypertension and a sedentary lifestyle.  Hypertension This is a chronic problem. The current episode started more than 1 year ago. Pertinent negatives include no chest pain, headaches, neck pain, palpitations or shortness of breath. Risk factors for coronary artery disease include diabetes mellitus, dyslipidemia, obesity, sedentary lifestyle  and smoking/tobacco exposure. Past treatments include calcium channel blockers. Hypertensive end-organ damage includes CAD/MI.    Review of systems  Objective:    BP 127/73   Pulse 85   Ht 5\' 5"  (1.651 m)   Wt 183 lb 6.4 oz (83.2 kg)   BMI 30.52 kg/m   Wt Readings from Last 3 Encounters:  10/17/20 183 lb (83 kg)  10/17/20 183 lb 6.4 oz (83.2 kg)  10/16/20 183 lb 3.2 oz (83.1 kg)     Physical Exam- Limited    CMP     Component Value Date/Time   NA 137 10/11/2020 0904   K 3.9 10/11/2020 0904   CL 99 10/11/2020 0904   CO2 24 10/11/2020 0904   GLUCOSE 126 (H) 10/11/2020 0904   GLUCOSE 159 (H) 06/21/2020 0707   BUN 6 10/11/2020 0904   CREATININE 0.62 (L) 10/11/2020 0904   CREATININE 0.67 (L) 06/21/2020 0707   CALCIUM 9.4 10/11/2020 0904   PROT 7.4 10/11/2020 0904   ALBUMIN 4.4 10/11/2020 0904   AST 48 (H) 10/11/2020 0904   ALT 30 10/11/2020 0904   ALKPHOS 169 (H) 10/11/2020 0904   BILITOT 1.0 10/11/2020 0904   GFRNONAA 112 10/11/2020 0904   GFRNONAA 108 06/21/2020 0707   GFRAA 129 10/11/2020 0904   GFRAA 125 06/21/2020 0707    Diabetic Labs (most recent): Lab Results  Component Value Date   HGBA1C 6.1 10/17/2020   HGBA1C 6.3 (H) 06/21/2020   HGBA1C 6.5 (A) 02/20/2020     Lipid Panel ( most recent) Lipid Panel     Component Value Date/Time   CHOL 164 06/21/2020 0707   TRIG 139 06/21/2020 0707   HDL 23 (L) 06/21/2020 0707   CHOLHDL 7.1 (H) 06/21/2020 0707   VLDL 41 (H) 04/27/2017 0505   LDLCALC 116 (H) 06/21/2020 0707      Lab Results  Component Value Date   TSH 3.61 06/21/2020   TSH 1.58 11/03/2018   TSH 1.79 11/17/2017   TSH 1.635 04/26/2017   TSH 2.564 06/27/2015   FREET4 1.5 11/03/2018   FREET4 1.4 11/17/2017      Assessment & Plan:   1. DM type 2 causing vascular disease ([place])  - Randy Hebert has currently uncontrolled symptomatic type 2 DM since  55 years of age.  He presents with no meter nor logs.  His point-of-care A1c  is 6.1%, overall improving from 7.5%.  He reports lowest blood glucose result was 101.       -his diabetes is complicated by  coronary artery disease which required stent placement, obesity/sedentary life, chronic heavy smoking, and Randy Hebert remains at a high risk for more acute and chronic complications which include CAD, CVA, CKD, retinopathy, and neuropathy. These are all discussed in detail with the patient.  - I have counseled him on diet management and weight loss, by adopting a carbohydrate restricted/protein rich diet. -Still admits to dietary indiscretions including consumption of sweets and sweetened beverages.  - he acknowledges that there is a room for improvement in his food and drink choices. - Suggestion is made for him to avoid simple carbohydrates  from his diet including Cakes, Sweet Desserts, Ice Cream, Soda (diet and regular), Sweet Tea, Candies, Chips, Cookies, Store Bought Juices, Alcohol in Excess of  1-2 drinks a day, Artificial Sweeteners,  Coffee Creamer, and "Sugar-free" Products, Lemonade. This will help patient to have more stable blood glucose profile and potentially avoid unintended weight gain.   - I encouraged him to switch to  unprocessed or minimally processed complex starch and increased protein intake (animal or plant source), fruits, and  vegetables.  - he is advised to stick to a routine mealtimes to eat 3 meals  a day and avoid unnecessary snacks ( to snack only to correct hypoglycemia).    - I have approached him with the following individualized plan to manage diabetes and patient agrees:   -He continues to have well-controlled diabetes without insulin.  -Given his history of coronary artery disease, he will benefit from tighter control of diabetes.  He is advised to continue Metformin 1000 mg p.o. twice daily, glipizide 5 mg XL p.o. daily at breakfast. -He is approached to continue monitoring blood glucose at least once a day-daily before  breakfast.   -He is encouraged to call clinic for blood glucose readings less than 70 or greater than 200x3 in a week.   -Patient specific target  A1c;  LDL, HDL, Triglycerides, and  Waist Circumference were discussed in detail.  2) BP/HTN:  -His blood pressure is controlled to target.  -He remains a chronic heavy smoker. He has a documented allergy of ACE inhibitors anaphylaxis .  He is advised to continue amlodipine 10 mg p.o. daily, with as needed diuretics.  He is extensively counseled for smoking cessation.     3) Lipids/HPL: Recent lipid panel showed controlled LDL at 86, triglycerides still high at 215.  He is advised to avoid butter and fried food.  Due to elevated liver enzymes (which are mainly due to his alcohol abuse) his atorvastatin was lowered to 40 mg p.o. nightly.    The patient was counseled on the dangers of tobacco use, and was advised to quit.  Reviewed strategies to maximize success, including removing cigarettes and smoking materials from environment.   4)  Weight/Diet: His BMI is 16.10,  cearly complicating his diabetes care.  He is a candidate for modest weight loss.  CDE Consult has been initiated and in progress,  exercise, and detailed carbohydrates information provided.  5) Chronic Care/Health Maintenance:  -he  is on Statin medications and  is encouraged to continue to follow up with Ophthalmology, Dentist,  Podiatrist at least yearly or according to recommendations, and advised to  Quit smoking ( is a chronic heavy smoker for the last 40 days), and consider weaning off of alcohol.  I have recommended yearly flu vaccine and pneumonia vaccination at least every 5 years; moderate intensity exercise for up to 150 minutes weekly; and  sleep for at least 7 hours a day.  POCT ABI Results 10/17/20  Is ABIs normal today Right ABI: 1.24      left ABI: 1.25  Right leg systolic / diastolic: 960/45 mmHg Left leg systolic / diastolic: 409/81 mmHg  Arm systolic / diastolic:  191/47 mmHG This study will be repeated in 5 years or sooner if needed.  - I advised patient to maintain close follow up with Perlie Mayo, NP for primary care needs.  - Time spent on this patient care encounter:  40 min, of which > 50% was spent in  counseling and the rest reviewing his blood glucose logs , discussing his hypoglycemia and hyperglycemia episodes, reviewing his current and  previous labs / studies  ( including abstraction from other facilities) and medications  doses and developing a  long term treatment plan and documenting his care.   Please refer to Patient Instructions for Blood Glucose Monitoring and Insulin/Medications Dosing Guide"  in media tab for additional information. Please  also refer to " Patient Self Inventory" in the Media  tab for reviewed elements of  pertinent patient history.  Randy Hebert participated in the discussions, expressed understanding, and voiced agreement with the above plans.  All questions were answered to his satisfaction. he is encouraged to contact clinic should he have any questions or concerns prior to his return visit.   Follow up plan: - Return in about 6 months (around 04/16/2021) for A1c -[place].  Glade Lloyd, MD [facility] [address] [place], [place] [address] Phone: [phone]  Fax: [phone]    10/17/2020, 3:01 PM  This note was partially dictated with voice recognition software. Similar sounding words can be transcribed inadequately or may not  be corrected upon review.

## 2020-10-17 NOTE — Progress Notes (Signed)
Established Patient Office Visit  Subjective:  Patient ID: Randy Hebert, male    DOB: [birth year]  Age: 55 y.o. MRN: [id number]  CC:  Chief Complaint  Patient presents with  . Follow-up  . Hypertension    HPI Randy Hebert presents for follow-up for HTN. He is followed by cardiology for med adjustments. He has hyperlipidemia and DM2. He is followed by Dr. Dorris Fetch. He states that he just had labs with Dr. Dorris Fetch and he lost 30 pounds since Thanksgiving. He states he has cut back on his eating since his girlfriend died on thanksgiving.  Past Medical History:  Diagnosis Date  . ACE inhibitor-aggravated angioedema   . Allergy   . Angio-edema   . Anxiety   . Arthritis   . Asthma    hip replacement  . Back pain   . Bradycardia 04/28/2017  . Bulging of cervical intervertebral disc   . CAD (coronary artery disease)    lateral STEMI 02/06/2015 00% D1 occlusion treated with Promus Premier 2.5 mm x 16 mm DES, 70% ramus stenosis, 40% mid RCA stenosis, 45% distal RCA stenosis, EF 45-50%  . CHF (congestive heart failure) ([place])   . COPD (chronic obstructive pulmonary disease) ([place])   . Depression   . Diabetes mellitus without complication ([place])   . 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 Dry eye   . Dyspnea   . Early satiety 09/23/2016  . GERD (gastroesophageal reflux disease)   . HCAP (healthcare-associated pneumonia) 05/15/2017  . Headache   . Heart murmur   . Hip pain   . Hyperlipidemia   . Hypertension   . Lobar pneumonia ([place]) 05/15/2017  . Melena 08/04/2018  . MI (myocardial infarction) ([place])   . Myocardial infarction ([place])   . Neck pain   . Non-ST elevation (NSTEMI) myocardial infarction ([place]) 04/27/2017  . NSTEMI (non-ST elevated myocardial infarction) ([place]) 04/26/2017  . Otitis media   . Pleurisy   . Pneumonia due to COVID-19 virus 10/07/2019  . Rectal bleeding 11/08/2018  . Right shoulder pain 03/20/2020  . Sinus pause    9 sec sinus  pause on telemetry after started on coreg after MI, avoid AV nodal blocking agent  . Sleep apnea   . 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 STEMI (ST elevation myocardial infarction) ([place]) 05/31/2015  . 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 Syncope 06/29/2017  . Syncope and collapse 05/15/2017  . Transaminitis 08/04/2018  . Unilateral primary osteoarthritis, right hip 07/01/2016    Past Surgical History:  Procedure Laterality Date  . BIOPSY  10/09/2016   Procedure: BIOPSY;  Surgeon: Daneil Dolin, MD;  [facility];  Service: Endoscopy;;  . BIOPSY  11/28/2019   Procedure: BIOPSY;  Surgeon: Daneil Dolin, MD;  [facility];  Service: Endoscopy;;  . CARDIAC CATHETERIZATION N/A 02/06/2015   Procedure: Left Heart Cath and Coronary Angiography;  Surgeon: Leonie Man, MD;  Location: [facility];  Service: Cardiovascular;  Laterality: N/A;  . CARDIAC CATHETERIZATION N/A 02/06/2015   Procedure: Coronary Stent Intervention;  Surgeon: Leonie Man, MD;  Location: [facility];  Service: Cardiovascular;  Laterality: N/A;  . COLONOSCOPY WITH PROPOFOL N/A 10/09/2016   Sigmoid and descending colon diverticulosis, four 4-6 mm polyps in sigmoid, one 4 mm polyp in descending. Tubular adenomas and hyperplastic. 5 year surveillance.   . COLONOSCOPY WITH PROPOFOL  N/A 11/24/2016   Sigmoid and descending colon diverticulosis, four 4-6 mm polyps in sigmoid, one 4 mm polyp in descending. Tubular adenomas and hyperplastic. 5 year surveillance.   . CORONARY ANGIOPLASTY WITH STENT PLACEMENT  01/2015  . CORONARY STENT INTERVENTION N/A 04/27/2017   Procedure: CORONARY STENT INTERVENTION;  Surgeon: Nelva Bush, MD;  Location: [facility];  Service: Cardiovascular;  Laterality: N/A;  . ELECTROPHYSIOLOGY STUDY N/A 06/29/2017   Procedure: ELECTROPHYSIOLOGY STUDY;  Surgeon: Evans Lance, MD;  Location: [facility];  Service: Cardiovascular;   Laterality: N/A;  . ESOPHAGOGASTRODUODENOSCOPY (EGD) WITH PROPOFOL N/A 10/09/2016   Dr. Gala Romney: LA grade a esophagitis.  Barrett's esophagus, biopsy-proven.  Small hiatal hernia.  EGD February 2019  . ESOPHAGOGASTRODUODENOSCOPY (EGD) WITH PROPOFOL N/A 11/28/2019    salmon-colored esophageal mucosa (Barrett's) small hiatal hernia, portal hypertensive gastropathy, normal duodenum, 3 year surveillance  . INCISION / DRAINAGE HAND / FINGER    . LEFT HEART CATH AND CORONARY ANGIOGRAPHY N/A 04/27/2017   Procedure: LEFT HEART CATH AND CORONARY ANGIOGRAPHY;  Surgeon: Nelva Bush, MD;  Location: [facility];  Service: Cardiovascular;  Laterality: N/A;  . LOOP RECORDER INSERTION  06/29/2017   Procedure: Loop Recorder Insertion;  Surgeon: Evans Lance, MD;  Location: [facility];  Service: Cardiovascular;;  . POLYPECTOMY  11/24/2016   Procedure: POLYPECTOMY;  Surgeon: Daneil Dolin, MD;  [facility];  Service: Endoscopy;;  descending and sigmoid  . TOTAL HIP ARTHROPLASTY Right 07/01/2016  . TOTAL HIP ARTHROPLASTY Right 07/01/2016   Procedure: RIGHT TOTAL HIP ARTHROPLASTY ANTERIOR APPROACH;  Surgeon: Mcarthur Rossetti, MD;  Location: [facility];  Service: Orthopedics;  Laterality: Right;    Family History  Problem Relation Age of Onset  . Heart attack Father   . Stroke Father   . Arthritis Father   . Heart disease Father   . Cancer Mother        ???  . Arthritis Mother   . Heart disease Brother 27       died in sleep  . Early death Brother   . Diabetes Maternal Uncle   . Alzheimer's disease Maternal Grandmother     Social History   Socioeconomic History  . Marital status: Divorced    Spouse name: alice  . Number of children: 3  . Years of education: 69  . Highest education level: Not on file  Occupational History  . Occupation: SSI  Tobacco Use  . Smoking status: Current Every Day Smoker    Packs/day: 1.00    Years: 46.00    Pack years: 46.00    Types:  Cigarettes  . Smokeless tobacco: Former Systems developer    Types: Secondary school teacher  . Vaping Use: Former  Substance and Sexual Activity  . Alcohol use: Yes    Alcohol/week: 0.0 standard drinks    Comment: half a pint every other day  . Drug use: No    Comment: history of drug use- marijuana, cocaine- quit 2014  . Sexual activity: Yes    Partners: Female    Birth control/protection: None  Other Topics Concern  . Not on file  Social History Narrative   Lives with girlfriend [name]   3 children, but 1 OD  In 2019.   Grandchildren-2      Two cats: may and princess; and a dog-foxy      Enjoy: spending time with pets, TV, yard work       Diet:  eats all food groups   Caffeine: half and half coffee, some tea-half and half decaf   Water: 6-8 cups daily      Wears seat belt   Does not use phone while driving   Smoke detectors at home      Social Determinants of Health   Financial Resource Strain: Low Risk   . Difficulty of Paying Living Expenses: Not hard at all  Food Insecurity: No Food Insecurity  . Worried About Charity fundraiser in the Last Year: Never true  . Ran Out of Food in the Last Year: Never true  Transportation Needs: No Transportation Needs  . Lack of Transportation (Medical): No  . Lack of Transportation (Non-Medical): No  Physical Activity: Insufficiently Active  . Days of Exercise per Week: 7 days  . Minutes of Exercise per Session: 20 min  Stress: No Stress Concern Present  . Feeling of Stress : Not at all  Social Connections: Moderately Integrated  . Frequency of Communication with Friends and Family: More than three times a week  . Frequency of Social Gatherings with Friends and Family: More than three times a week  . Attends Religious Services: More than 4 times per year  . Active Member of Clubs or Organizations: No  . Attends Archivist Meetings: Never  . Marital Status: Living with partner  Intimate Partner Violence: Not At Risk  . Fear of  Current or Ex-Partner: No  . Emotionally Abused: No  . Physically Abused: No  . Sexually Abused: No    Outpatient Medications Prior to Visit  Medication Sig Dispense Refill  . acetaminophen (TYLENOL) 500 MG tablet Take 1,000 mg by mouth every 4 (four) hours as needed for mild pain or fever.     Marland Kitchen albuterol (PROVENTIL) (2.5 MG/3ML) 0.083% nebulizer solution Take 3 mLs (2.5 mg total) by nebulization every 6 (six) hours as needed for wheezing or shortness of breath. 150 mL 1  . amLODipine (NORVASC) 10 MG tablet TAKE 1 TABLET BY MOUTH ONCE A DAY. (Patient taking differently: Take 10 mg by mouth daily.) 30 tablet 0  . ascorbic acid (VITAMIN C) 500 MG tablet Take 1 tablet (500 mg total) by mouth daily. (Patient taking differently: Take 500 mg by mouth every evening.) 30 tablet 1  . aspirin 81 MG EC tablet Take 1 tablet (81 mg total) by mouth daily. (Patient taking differently: Take 81 mg by mouth 2 (two) times daily.) 30 tablet   . atorvastatin (LIPITOR) 40 MG tablet Take 1 tablet (40 mg total) by mouth every evening. 30 tablet 6  . azelastine (ASTELIN) 0.1 % nasal spray USE 2 SPRAYS IN EACH NOSTRIL TWICE DAILY. 30 mL 5  . Carboxymeth-Glycerin-Polysorb 0.5-1-0.5 % SOLN Apply 1 drop to eye 2 (two) times daily as needed (dry eyes).     . Cholecalciferol (VITAMIN D3) 125 MCG (5000 UT) CAPS Take 1 capsule (5,000 Units total) by mouth daily. (Patient taking differently: Take 5,000 Units by mouth daily in the afternoon.) 90 capsule 0  . citalopram (CELEXA) 20 MG tablet Take 20 mg by mouth daily.    . cycloSPORINE (RESTASIS) 0.05 % ophthalmic emulsion Place 1 drop into both eyes 2 (two) times daily.    . ferrous sulfate 325 (65 FE) MG tablet Take 325 mg by mouth daily with breakfast.    . furosemide (LASIX) 40 MG tablet Take 1 tablet (40 mg total) by mouth daily as needed for fluid. 30 tablet 11  . glipiZIDE (GLUCOTROL  XL) 5 MG 24 hr tablet TAKE 1 TABLET BY MOUTH DAILY WITH BREAKFAST. 30 tablet 0  . glucose  blood (ACCU-CHEK AVIVA PLUS) test strip Use as instructed to check blood glucose once daily, DX E11.59 100 each 12  . magnesium gluconate (MAGONATE) 500 MG tablet Take 500 mg by mouth 2 (two) times daily.    . metFORMIN (GLUCOPHAGE) 1000 MG tablet Take 1 tablet (1,000 mg total) by mouth 2 (two) times daily with a meal. 180 tablet 1  . mometasone (ELOCON) 0.1 % cream Apply 1 application topically 2 (two) times a week.     . nitroGLYCERIN (NITROSTAT) 0.4 MG SL tablet Place 1 tablet (0.4 mg total) under the tongue every 5 (five) minutes as needed for chest pain. 25 tablet 3  . pantoprazole (PROTONIX) 40 MG tablet TAKE 1 TABLET BY MOUTH ONCE A DAY. 90 tablet 3  . prednisoLONE acetate (PRED FORTE) 1 % ophthalmic suspension Place 4 drops into both eyes 2 (two) times a week.     Marland Kitchen PROVENTIL HFA 108 (90 Base) MCG/ACT inhaler INHALE 2 PUFFS INTO THE LUNGS EVERY 6 HOURS AS NEEDED FOR SHORTNESS OF BREATH OR WHEEZING. (Patient taking differently: Inhale 2 puffs into the lungs every 6 (six) hours as needed for wheezing or shortness of breath.) 18 g 1  . SPIRIVA RESPIMAT 2.5 MCG/ACT AERS INHALE 1 PUFF INTO THE LUNGS DAILY. 4 g 0  . SYMBICORT 160-4.5 MCG/ACT inhaler INHALE 2 PUFFS INTO THE LUNGS TWO TIMES DAILY. 10.2 g 0  . chlorthalidone (HYGROTON) 25 MG tablet Take 0.5 tablets (12.5 mg total) by mouth daily. 45 tablet 1  . potassium chloride SA (KLOR-CON) 20 MEQ tablet Take 2 tablets (40 mEq total) by mouth daily. 180 tablet 1   No facility-administered medications prior to visit.    Allergies  Allergen Reactions  . Carvedilol Other (See Comments)    Sinus pause on telemetry >3 seconds. Longest one 9 sec. No AV nodal agent  . Lisinopril Anaphylaxis, Shortness Of Breath and Swelling    Angioedema, required intubation and mechanical ventilation  . Amoxicillin Nausea And Vomiting and Nausea Only    Nausea only - no allergy  Did it involve swelling of the face/tongue/throat, SOB, or low BP? No Did it  involve sudden or severe rash/hives, skin peeling, or any reaction on the inside of your mouth or nose? No Did you need to seek medical attention at a hospital or doctor's office? No When did it last happen?childhood reaction If all above answers are "NO", may proceed with cephalosporin use.     ROS Review of Systems  Constitutional: Negative.   Respiratory: Negative.   Cardiovascular: Negative.   Musculoskeletal: Negative.   Psychiatric/Behavioral: Negative.       Objective:    Physical Exam Constitutional:      Appearance: Normal appearance.  Cardiovascular:     Rate and Rhythm: Normal rate and regular rhythm.     Pulses: Normal pulses.     Heart sounds: Normal heart sounds.  Pulmonary:     Breath sounds: Wheezing present.  Musculoskeletal:        General: Normal range of motion.  Neurological:     Mental Status: He is alert.  Psychiatric:        Mood and Affect: Mood normal.        Behavior: Behavior normal.        Thought Content: Thought content normal.        Judgment: Judgment normal.  There were no vitals taken for this visit. Wt Readings from Last 3 Encounters:  10/17/20 183 lb 6.4 oz (83.2 kg)  10/16/20 183 lb 3.2 oz (83.1 kg)  10/11/20 177 lb 6.4 oz (80.5 kg)     Health Maintenance Due  Topic Date Due  . COVID-19 Vaccine (2 - Moderna 3-dose series) 03/19/2020  . URINE MICROALBUMIN  11/08/2020    There are no preventive care reminders to display for this patient.  Lab Results  Component Value Date   TSH 3.61 06/21/2020   Lab Results  Component Value Date   WBC 7.5 06/21/2020   HGB 15.2 06/21/2020   HCT 45.6 06/21/2020   MCV 91.9 06/21/2020   PLT 228 06/21/2020   Lab Results  Component Value Date   NA 137 10/11/2020   K 3.9 10/11/2020   CO2 24 10/11/2020   GLUCOSE 126 (H) 10/11/2020   BUN 6 10/11/2020   CREATININE 0.62 (L) 10/11/2020   BILITOT 1.0 10/11/2020   ALKPHOS 169 (H) 10/11/2020   AST 48 (H) 10/11/2020   ALT 30  10/11/2020   PROT 7.4 10/11/2020   ALBUMIN 4.4 10/11/2020   CALCIUM 9.4 10/11/2020   ANIONGAP 8 10/11/2019   Lab Results  Component Value Date   CHOL 164 06/21/2020   Lab Results  Component Value Date   HDL 23 (L) 06/21/2020   Lab Results  Component Value Date   LDLCALC 116 (H) 06/21/2020   Lab Results  Component Value Date   TRIG 139 06/21/2020   Lab Results  Component Value Date   CHOLHDL 7.1 (H) 06/21/2020   Lab Results  Component Value Date   HGBA1C 6.1 10/17/2020      Assessment & Plan:   Problem List Items Addressed This Visit      Cardiovascular and Mediastinum   Essential hypertension, benign    -BP well controlled today -no change to meds      DM type 2 causing vascular disease ([place]) - Primary   Relevant Orders   CBC with Differential/Platelet   CMP14+EGFR   Lipid Panel With LDL/HDL Ratio     Respiratory   Asthma-COPD overlap syndrome (HCC)    -has wheezing on exam today, but he states he feels great and has been well controlled -continue albuterol and spiriva        Nervous and Auditory   Cigarette nicotine dependence with nicotine-induced disorder    -no interest in quitting today        Other   Mixed hyperlipidemia    -will check with next set of fasting labs      Relevant Orders   CBC with Differential/Platelet   CMP14+EGFR   Lipid Panel With LDL/HDL Ratio      No orders of the defined types were placed in this encounter.   Follow-up: Return in about 3 months (around 01/14/2021) for Lab follow-up.    Noreene Larsson, NP

## 2020-10-17 NOTE — Patient Instructions (Addendum)
We will meet back up in 3 months since you had recent labs drawn. Please get fasting labs 2-3 days prior to your appointment.

## 2020-10-18 DIAGNOSIS — R569 Unspecified convulsions: Secondary | ICD-10-CM | POA: Diagnosis not present

## 2020-10-18 DIAGNOSIS — I10 Essential (primary) hypertension: Secondary | ICD-10-CM | POA: Diagnosis not present

## 2020-10-18 DIAGNOSIS — G5600 Carpal tunnel syndrome, unspecified upper limb: Secondary | ICD-10-CM | POA: Diagnosis not present

## 2020-10-18 DIAGNOSIS — G4733 Obstructive sleep apnea (adult) (pediatric): Secondary | ICD-10-CM | POA: Diagnosis not present

## 2020-10-19 ENCOUNTER — Encounter: Payer: Self-pay | Admitting: Gastroenterology

## 2020-10-19 ENCOUNTER — Other Ambulatory Visit: Payer: Self-pay

## 2020-10-19 ENCOUNTER — Ambulatory Visit (INDEPENDENT_AMBULATORY_CARE_PROVIDER_SITE_OTHER): Payer: Medicare Other | Admitting: Gastroenterology

## 2020-10-19 ENCOUNTER — Ambulatory Visit (INDEPENDENT_AMBULATORY_CARE_PROVIDER_SITE_OTHER): Payer: Medicare Other

## 2020-10-19 VITALS — BP 132/74 | HR 90 | Temp 97.3°F | Ht 65.0 in | Wt 180.6 lb

## 2020-10-19 DIAGNOSIS — K219 Gastro-esophageal reflux disease without esophagitis: Secondary | ICD-10-CM

## 2020-10-19 DIAGNOSIS — J309 Allergic rhinitis, unspecified: Secondary | ICD-10-CM | POA: Diagnosis not present

## 2020-10-19 DIAGNOSIS — K760 Fatty (change of) liver, not elsewhere classified: Secondary | ICD-10-CM

## 2020-10-19 DIAGNOSIS — K2270 Barrett's esophagus without dysplasia: Secondary | ICD-10-CM

## 2020-10-19 MED ORDER — PANTOPRAZOLE SODIUM 40 MG PO TBEC: 40.0000 mg | DELAYED_RELEASE_TABLET | Freq: Two times a day (BID) | ORAL | 3 refills | Status: DC

## 2020-10-19 NOTE — Patient Instructions (Signed)
I have increased your Protonix to twice a day, 30 minutes before breakfast and dinner for the next month. Let me know if this helps your raspy/hoarse voice!  I have ordered an ultrasound of your liver. I am concerned you may be developing cirrhosis or have some underlying cirrhosis. It is very important to avoid alcohol completely so as to decrease risk of advancing liver disease.  We will repeat liver numbers in 3 months!  We will see you in 6 months!  I enjoyed seeing you again today! As you know, I value our relationship and want to provide genuine, compassionate, and quality care. I welcome your feedback. If you receive a survey regarding your visit,  I greatly appreciate you taking time to fill this out. See you next time!  Annitta Needs, PhD, ANP-BC [facility]

## 2020-10-19 NOTE — Progress Notes (Signed)
Referring Provider: Perlie Mayo, NP Primary Care Physician:  Perlie Mayo, NP Primary GI: Dr. Gala Romney   Chief Complaint  Patient presents with  . Gastroesophageal Reflux    Does okay as long as he does not eat spicey food before bed. C/o raspy voice  . fatty liver    F/u    HPI:   Randy Hebert is a 55 y.o. male presenting today with a history of Barrett's, fatty liver with possible underlying cirrhosis(with negative Hep C antibody, negative Hep B surface antigen, negative hemochromatosis screen), adenomas with surveillance due in 2023. Recent EGD completed March 2021 with Barrett's and portal gastropathy. Surveillance due in 2023.    Recent HFP with Alk Phos 169, AST 48, ALT 30.   US abdomen complete July 2021 with mild hepatomegaly, diffuse fatty liver, mild splenomegaly. 2 pints liquor a week.    Girlfriend passed on Thanksgiving day. He has eaten smaller portions since that time.   GERD well managed unless eating late. Raspy voice, hoarse at times but not painful.   Had LLQ groin/LLQ abdominal pain after doing stomach crunches for exercise. Also had the same pain when getting a pillow from behind the bed. Felt like a searing/burning pain. Goes away in a matter of seconds.    Past Medical History:  Diagnosis Date  . ACE inhibitor-aggravated angioedema   . Allergy   . Angio-edema   . Anxiety   . Arthritis   . Asthma    hip replacement  . Back pain   . Bradycardia 04/28/2017  . Bulging of cervical intervertebral disc   . CAD (coronary artery disease)    lateral STEMI 02/06/2015 00% D1 occlusion treated with Promus Premier 2.5 mm x 16 mm DES, 70% ramus stenosis, 40% mid RCA stenosis, 45% distal RCA stenosis, EF 45-50%  . CHF (congestive heart failure) ([place])   . COPD (chronic obstructive pulmonary disease) ([place])   . Depression   . Diabetes mellitus without complication ([place])   . 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 Dry  eye   . Dyspnea   . Early satiety 09/23/2016  . GERD (gastroesophageal reflux disease)   . HCAP (healthcare-associated pneumonia) 05/15/2017  . Headache   . Heart murmur   . Hip pain   . Hyperlipidemia   . Hypertension   . Lobar pneumonia ([place]) 05/15/2017  . Melena 08/04/2018  . MI (myocardial infarction) ([place])   . Myocardial infarction ([place])   . Neck pain   . Non-ST elevation (NSTEMI) myocardial infarction ([place]) 04/27/2017  . NSTEMI (non-ST elevated myocardial infarction) ([place]) 04/26/2017  . Otitis media   . Pleurisy   . Pneumonia due to COVID-19 virus 10/07/2019  . Rectal bleeding 11/08/2018  . Right shoulder pain 03/20/2020  . Sinus pause    9 sec sinus pause on telemetry after started on coreg after MI, avoid AV nodal blocking agent  . Sleep apnea   . 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 STEMI (ST elevation myocardial infarction) ([place]) 05/31/2015  . 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 Syncope 06/29/2017  . Syncope and collapse 05/15/2017  . Transaminitis 08/04/2018  . Unilateral primary osteoarthritis, right hip 07/01/2016    Past Surgical History:  Procedure Laterality Date  . BIOPSY  10/09/2016   Procedure: BIOPSY;  Surgeon: Daneil Dolin, MD;  [facility];  Service: Endoscopy;;  . BIOPSY  11/28/2019  Procedure: BIOPSY;  Surgeon: Daneil Dolin, MD;  [facility];  Service: Endoscopy;;  . CARDIAC CATHETERIZATION N/A 02/06/2015   Procedure: Left Heart Cath and Coronary Angiography;  Surgeon: Leonie Man, MD;  Location: [facility];  Service: Cardiovascular;  Laterality: N/A;  . CARDIAC CATHETERIZATION N/A 02/06/2015   Procedure: Coronary Stent Intervention;  Surgeon: Leonie Man, MD;  Location: [facility];  Service: Cardiovascular;  Laterality: N/A;  . COLONOSCOPY WITH PROPOFOL N/A 10/09/2016   Sigmoid and descending colon diverticulosis, four 4-6 mm polyps in sigmoid, one 4 mm polyp in descending. Tubular adenomas and  hyperplastic. 5 year surveillance.   . COLONOSCOPY WITH PROPOFOL N/A 11/24/2016   Sigmoid and descending colon diverticulosis, four 4-6 mm polyps in sigmoid, one 4 mm polyp in descending. Tubular adenomas and hyperplastic. 5 year surveillance.   . CORONARY ANGIOPLASTY WITH STENT PLACEMENT  01/2015  . CORONARY STENT INTERVENTION N/A 04/27/2017   Procedure: CORONARY STENT INTERVENTION;  Surgeon: Nelva Bush, MD;  Location: [facility];  Service: Cardiovascular;  Laterality: N/A;  . ELECTROPHYSIOLOGY STUDY N/A 06/29/2017   Procedure: ELECTROPHYSIOLOGY STUDY;  Surgeon: Evans Lance, MD;  Location: [facility];  Service: Cardiovascular;  Laterality: N/A;  . ESOPHAGOGASTRODUODENOSCOPY (EGD) WITH PROPOFOL N/A 10/09/2016   Dr. Gala Romney: LA grade a esophagitis.  Barrett's esophagus, biopsy-proven.  Small hiatal hernia.  EGD February 2019  . ESOPHAGOGASTRODUODENOSCOPY (EGD) WITH PROPOFOL N/A 11/28/2019    salmon-colored esophageal mucosa (Barrett's) small hiatal hernia, portal hypertensive gastropathy, normal duodenum, 3 year surveillance  . INCISION / DRAINAGE HAND / FINGER    . LEFT HEART CATH AND CORONARY ANGIOGRAPHY N/A 04/27/2017   Procedure: LEFT HEART CATH AND CORONARY ANGIOGRAPHY;  Surgeon: Nelva Bush, MD;  Location: [facility];  Service: Cardiovascular;  Laterality: N/A;  . LOOP RECORDER INSERTION  06/29/2017   Procedure: Loop Recorder Insertion;  Surgeon: Evans Lance, MD;  Location: [facility];  Service: Cardiovascular;;  . POLYPECTOMY  11/24/2016   Procedure: POLYPECTOMY;  Surgeon: Daneil Dolin, MD;  [facility];  Service: Endoscopy;;  descending and sigmoid  . TOTAL HIP ARTHROPLASTY Right 07/01/2016  . TOTAL HIP ARTHROPLASTY Right 07/01/2016   Procedure: RIGHT TOTAL HIP ARTHROPLASTY ANTERIOR APPROACH;  Surgeon: Mcarthur Rossetti, MD;  Location: [place] [facility];  Service: Orthopedics;  Laterality: Right;    Current Outpatient Medications   Medication Sig Dispense Refill  . acetaminophen (TYLENOL) 500 MG tablet Take 1,000 mg by mouth every 4 (four) hours as needed for mild pain or fever.     Marland Kitchen albuterol (PROVENTIL) (2.5 MG/3ML) 0.083% nebulizer solution Take 3 mLs (2.5 mg total) by nebulization every 6 (six) hours as needed for wheezing or shortness of breath. 150 mL 1  . amLODipine (NORVASC) 10 MG tablet TAKE 1 TABLET BY MOUTH ONCE A DAY. (Patient taking differently: Take 10 mg by mouth daily.) 30 tablet 0  . ascorbic acid (VITAMIN C) 500 MG tablet Take 1 tablet (500 mg total) by mouth daily. (Patient taking differently: Take 500 mg by mouth every evening.) 30 tablet 1  . aspirin 81 MG EC tablet Take 1 tablet (81 mg total) by mouth daily. (Patient taking differently: Take 81 mg by mouth 2 (two) times daily.) 30 tablet   . atorvastatin (LIPITOR) 40 MG tablet Take 1 tablet (40 mg total) by mouth every evening. 30 tablet 6  . azelastine (ASTELIN) 0.1 % nasal spray USE 2 SPRAYS  IN EACH NOSTRIL TWICE DAILY. 30 mL 5  . Carboxymeth-Glycerin-Polysorb 0.5-1-0.5 % SOLN Apply 1 drop to eye 2 (two) times daily as needed (dry eyes).     . Cholecalciferol (VITAMIN D3) 125 MCG (5000 UT) CAPS Take 1 capsule (5,000 Units total) by mouth daily. (Patient taking differently: Take 5,000 Units by mouth daily in the afternoon.) 90 capsule 0  . citalopram (CELEXA) 20 MG tablet Take 20 mg by mouth daily.    . cycloSPORINE (RESTASIS) 0.05 % ophthalmic emulsion Place 1 drop into both eyes 2 (two) times daily.    . ferrous sulfate 325 (65 FE) MG tablet Take 325 mg by mouth daily with breakfast.    . furosemide (LASIX) 40 MG tablet Take 1 tablet (40 mg total) by mouth daily as needed for fluid. 30 tablet 11  . glipiZIDE (GLUCOTROL XL) 5 MG 24 hr tablet TAKE 1 TABLET BY MOUTH DAILY WITH BREAKFAST. 30 tablet 0  . glucose blood (ACCU-CHEK AVIVA PLUS) test strip Use as instructed to check blood glucose once daily, DX E11.59 100 each 12  . magnesium gluconate  (MAGONATE) 500 MG tablet Take 500 mg by mouth 2 (two) times daily.    . metFORMIN (GLUCOPHAGE) 1000 MG tablet Take 1 tablet (1,000 mg total) by mouth 2 (two) times daily with a meal. 180 tablet 1  . mometasone (ELOCON) 0.1 % cream Apply 1 application topically 2 (two) times a week.     . nitroGLYCERIN (NITROSTAT) 0.4 MG SL tablet Place 1 tablet (0.4 mg total) under the tongue every 5 (five) minutes as needed for chest pain. 25 tablet 3  . pantoprazole (PROTONIX) 40 MG tablet TAKE 1 TABLET BY MOUTH ONCE A DAY. 90 tablet 3  . prednisoLONE acetate (PRED FORTE) 1 % ophthalmic suspension Place 4 drops into both eyes 2 (two) times a week.     Marland Kitchen PROVENTIL HFA 108 (90 Base) MCG/ACT inhaler INHALE 2 PUFFS INTO THE LUNGS EVERY 6 HOURS AS NEEDED FOR SHORTNESS OF BREATH OR WHEEZING. (Patient taking differently: Inhale 2 puffs into the lungs every 6 (six) hours as needed for wheezing or shortness of breath.) 18 g 1  . SPIRIVA RESPIMAT 2.5 MCG/ACT AERS INHALE 1 PUFF INTO THE LUNGS DAILY. 4 g 0  . SYMBICORT 160-4.5 MCG/ACT inhaler INHALE 2 PUFFS INTO THE LUNGS TWO TIMES DAILY. 10.2 g 0  . chlorthalidone (HYGROTON) 25 MG tablet Take 0.5 tablets (12.5 mg total) by mouth daily. 45 tablet 1  . potassium chloride SA (KLOR-CON) 20 MEQ tablet Take 2 tablets (40 mEq total) by mouth daily. 180 tablet 1   No current facility-administered medications for this visit.    Allergies as of 10/19/2020 - Review Complete 10/19/2020  Allergen Reaction Noted  . Carvedilol Other (See Comments) 02/08/2015  . Lisinopril Anaphylaxis, Shortness Of Breath, and Swelling 05/30/2015  . Amoxicillin Nausea And Vomiting and Nausea Only 02/06/2015    Family History  Problem Relation Age of Onset  . Heart attack Father   . Stroke Father   . Arthritis Father   . Heart disease Father   . Cancer Mother        ???  . Arthritis Mother   . Heart disease Brother 58       died in sleep  . Early death Brother   . Diabetes Maternal Uncle    . Alzheimer's disease Maternal Grandmother     Social History   Socioeconomic History  . Marital status: Divorced    Spouse name:  alice  . Number of children: 3  . Years of education: 46  . Highest education level: Not on file  Occupational History  . Occupation: SSI  Tobacco Use  . Smoking status: Current Every Day Smoker    Packs/day: 1.00    Years: 46.00    Pack years: 46.00    Types: Cigarettes  . Smokeless tobacco: Former Systems developer    Types: Secondary school teacher  . Vaping Use: Former  Substance and Sexual Activity  . Alcohol use: Yes    Alcohol/week: 0.0 standard drinks    Comment: half a pint every other day  . Drug use: No    Comment: history of drug use- marijuana, cocaine- quit 2014  . Sexual activity: Yes    Partners: Female    Birth control/protection: None  Other Topics Concern  . Not on file  Social History Narrative   Lives with girlfriend Randy Hebert   3 children, but 1 OD  In 2019.   Grandchildren-2      Two cats: may and princess; and a dog-foxy      Enjoy: spending time with pets, TV, yard work       Diet: eats all food groups   Caffeine: half and half coffee, some tea-half and half decaf   Water: 6-8 cups daily      Wears seat belt   Does not use phone while driving   Oceanographer at home      Social Determinants of Health   Financial Resource Strain: Low Risk   . Difficulty of Paying Living Expenses: Not hard at all  Food Insecurity: No Food Insecurity  . Worried About Charity fundraiser in the Last Year: Never true  . Ran Out of Food in the Last Year: Never true  Transportation Needs: No Transportation Needs  . Lack of Transportation (Medical): No  . Lack of Transportation (Non-Medical): No  Physical Activity: Insufficiently Active  . Days of Exercise per Week: 7 days  . Minutes of Exercise per Session: 20 min  Stress: No Stress Concern Present  . Feeling of Stress : Not at all  Social Connections: Moderately Integrated  . Frequency of  Communication with Friends and Family: More than three times a week  . Frequency of Social Gatherings with Friends and Family: More than three times a week  . Attends Religious Services: More than 4 times per year  . Active Member of Clubs or Organizations: No  . Attends Archivist Meetings: Never  . Marital Status: Living with partner    Review of Systems: Gen: Denies fever, chills, anorexia. Denies fatigue, weakness, weight loss.  CV: Denies chest pain, palpitations, syncope, peripheral edema, and claudication. Resp: Denies dyspnea at rest, cough, wheezing, coughing up blood, and pleurisy. GI: seeHPI  Derm: Denies rash, itching, dry skin Psych: Denies depression, anxiety, memory loss, confusion. No homicidal or suicidal ideation.  Heme: Denies bruising, bleeding, and enlarged lymph nodes.  Physical Exam: BP 132/74   Pulse 90   Temp (!) 97.3 F (36.3 C)   Ht _0  (1.651 m)   Wt 180 lb 9.6 oz (81.9 kg)   BMI 30.05 kg/m  General:   Alert and oriented. No distress noted. Pleasant and cooperative.  Head:  Normocephalic and atraumatic. Eyes:  Conjuctiva clear without scleral icterus. Mouth:  Mask in place Abdomen:  +BS, round but soft, marked hepatomegaly with liver margin palpable extending across midline and RLQ. non-tender. Msk:  Symmetrical without gross deformities.  Normal posture. Extremities:  Without edema. Neurologic:  Alert and  oriented x4 Psych:  Alert and cooperative. Normal mood and affect.  Lab Results  Component Value Date   ALT 20 10/24/2020   AST 28 10/24/2020   ALKPHOS 164 (H) 10/24/2020   BILITOT 1.0 10/24/2020     ASSESSMENT: DAYLON LAFAVOR is a 55 y.o. male presenting today with history of Barrett's, fatty liver with concern for underlying cirrhosis(with negative Hep C antibody, negative Hep B surface antigen, negative hemochromatosis screen), adenomas with surveillance due in 2023. Recent EGD completed March 2021 with Barrett's and portal  gastropathy. Surveillance due in 2023.    Continues to drink alcohol. He has marked hepatomegaly on exam. With high concern for underlying cirrhosis, we are moving into cirrhosis care with ultrasound every 6 months. RUQ [place] due now. I have discussed absolute avoidance of alcohol going forward. If persistently elevated alk phos/transaminses, pursue more extensive serologies.   GERD: now with likely LPR. Increase PPI to BID for short-term. May need ENT evaluation. Diet/behavior modification discussed.    PLAN:  Update US abdomen Increase PPI to BID, call with update Repeat HFP in 3 months EGD in 2023 Colonoscopy in 2023 Return in 6 months Absolute avoidance of alcohol   Annitta Needs, PhD, [facility]

## 2020-10-23 ENCOUNTER — Other Ambulatory Visit: Payer: Self-pay | Admitting: Allergy & Immunology

## 2020-10-24 ENCOUNTER — Ambulatory Visit (INDEPENDENT_AMBULATORY_CARE_PROVIDER_SITE_OTHER): Payer: Medicare Other

## 2020-10-24 ENCOUNTER — Other Ambulatory Visit: Payer: Self-pay

## 2020-10-24 VITALS — BP 137/79 | HR 79 | Temp 97.4°F | Ht 65.0 in | Wt 178.0 lb

## 2020-10-24 DIAGNOSIS — Z0000 Encounter for general adult medical examination without abnormal findings: Secondary | ICD-10-CM

## 2020-10-24 DIAGNOSIS — J309 Allergic rhinitis, unspecified: Secondary | ICD-10-CM

## 2020-10-24 DIAGNOSIS — E782 Mixed hyperlipidemia: Secondary | ICD-10-CM | POA: Diagnosis not present

## 2020-10-24 DIAGNOSIS — D649 Anemia, unspecified: Secondary | ICD-10-CM | POA: Diagnosis not present

## 2020-10-24 DIAGNOSIS — E1159 Type 2 diabetes mellitus with other circulatory complications: Secondary | ICD-10-CM | POA: Diagnosis not present

## 2020-10-24 NOTE — Patient Instructions (Addendum)
[name] , Thank you for taking time to come for your Medicare Wellness Visit. I appreciate your ongoing commitment to your health goals. Please review the following plan we discussed and let me know if I can assist you in the future.   Screening recommendations/referrals: Colonoscopy: 11/25/26 Recommended yearly ophthalmology/optometry visit for glaucoma screening and checkup Recommended yearly dental visit for hygiene and checkup  Vaccinations: Influenza vaccine: Fall 2022 Pneumococcal vaccine: Complete Tdap vaccine: 05/16/29 Shingles vaccine: Declined    Advanced directives: POA and Living Will  Conditions/risks identified: None  Next appointment: 01/16/21 @ 3 pm  Preventive Care 40-64 Years, Male Preventive care refers to lifestyle choices and visits with your health care provider that can promote health and wellness. What does preventive care include?  A yearly physical exam. This is also called an annual well check.  Dental exams once or twice a year.  Routine eye exams. Ask your health care provider how often you should have your eyes checked.  Personal lifestyle choices, including:  Daily care of your teeth and gums.  Regular physical activity.  Eating a healthy diet.  Avoiding tobacco and drug use.  Limiting alcohol use.  Practicing safe sex.  Taking low-dose aspirin every day starting at age 49. What happens during an annual well check? The services and screenings done by your health care provider during your annual well check will depend on your age, overall health, lifestyle risk factors, and family history of disease. Counseling  Your health care provider may ask you questions about your:  Alcohol use.  Tobacco use.  Drug use.  Emotional well-being.  Home and relationship well-being.  Sexual activity.  Eating habits.  Work and work Statistician. Screening  You may have the following tests or measurements:  Height, weight, and  BMI.  Blood pressure.  Lipid and cholesterol levels. These may be checked every 5 years, or more frequently if you are over 27 years old.  Skin check.  Lung cancer screening. You may have this screening every year starting at age 68 if you have a 30-pack-year history of smoking and currently smoke or have quit within the past 15 years.  Fecal occult blood test (FOBT) of the stool. You may have this test every year starting at age 72.  Flexible sigmoidoscopy or colonoscopy. You may have a sigmoidoscopy every 5 years or a colonoscopy every 10 years starting at age 29.  Prostate cancer screening. Recommendations will vary depending on your family history and other risks.  Hepatitis C blood test.  Hepatitis B blood test.  Sexually transmitted disease (STD) testing.  Diabetes screening. This is done by checking your blood sugar (glucose) after you have not eaten for a while (fasting). You may have this done every 1-3 years. Discuss your test results, treatment options, and if necessary, the need for more tests with your health care provider. Vaccines  Your health care provider may recommend certain vaccines, such as:  Influenza vaccine. This is recommended every year.  Tetanus, diphtheria, and acellular pertussis (Tdap, Td) vaccine. You may need a Td booster every 10 years.  Zoster vaccine. You may need this after age 52.  Pneumococcal 13-valent conjugate (PCV13) vaccine. You may need this if you have certain conditions and have not been vaccinated.  Pneumococcal polysaccharide (PPSV23) vaccine. You may need one or two doses if you smoke cigarettes or if you have certain conditions. Talk to your health care provider about which screenings and vaccines you need and how often you need  them. This information is not intended to replace advice given to you by your health care provider. Make sure you discuss any questions you have with your health care provider. Document Released:  09/14/2015 Document Revised: 05/07/2016 Document Reviewed: 06/19/2015 Elsevier Interactive Patient Education  2017 [place] Prevention in the Home Falls can cause injuries. They can happen to people of all ages. There are many things you can do to make your home safe and to help prevent falls. What can I do on the outside of my home?  Regularly fix the edges of walkways and driveways and fix any cracks.  Remove anything that might make you trip as you walk through a door, such as a raised step or threshold.  Trim any bushes or trees on the path to your home.  Use bright outdoor lighting.  Clear any walking paths of anything that might make someone trip, such as rocks or tools.  Regularly check to see if handrails are loose or broken. Make sure that both sides of any steps have handrails.  Any raised decks and porches should have guardrails on the edges.  Have any leaves, snow, or ice cleared regularly.  Use sand or salt on walking paths during winter.  Clean up any spills in your garage right away. This includes oil or grease spills. What can I do in the bathroom?  Use night lights.  Install grab bars by the toilet and in the tub and shower. Do not use towel bars as grab bars.  Use non-skid mats or decals in the tub or shower.  If you need to sit down in the shower, use a plastic, non-slip stool.  Keep the floor dry. Clean up any water that spills on the floor as soon as it happens.  Remove soap buildup in the tub or shower regularly.  Attach bath mats securely with double-sided non-slip rug tape.  Do not have throw rugs and other things on the floor that can make you trip. What can I do in the bedroom?  Use night lights.  Make sure that you have a light by your bed that is easy to reach.  Do not use any sheets or blankets that are too big for your bed. They should not hang down onto the floor.  Have a firm chair that has side arms. You can use this for  support while you get dressed.  Do not have throw rugs and other things on the floor that can make you trip. What can I do in the kitchen?  Clean up any spills right away.  Avoid walking on wet floors.  Keep items that you use a lot in easy-to-reach places.  If you need to reach something above you, use a strong step stool that has a grab bar.  Keep electrical cords out of the way.  Do not use floor polish or wax that makes floors slippery. If you must use wax, use non-skid floor wax.  Do not have throw rugs and other things on the floor that can make you trip. What can I do with my stairs?  Do not leave any items on the stairs.  Make sure that there are handrails on both sides of the stairs and use them. Fix handrails that are broken or loose. Make sure that handrails are as long as the stairways.  Check any carpeting to make sure that it is firmly attached to the stairs. Fix any carpet that is loose or worn.  Avoid  having throw rugs at the top or bottom of the stairs. If you do have throw rugs, attach them to the floor with carpet tape.  Make sure that you have a light switch at the top of the stairs and the bottom of the stairs. If you do not have them, ask someone to add them for you. What else can I do to help prevent falls?  Wear shoes that:  Do not have high heels.  Have rubber bottoms.  Are comfortable and fit you well.  Are closed at the toe. Do not wear sandals.  If you use a stepladder:  Make sure that it is fully opened. Do not climb a closed stepladder.  Make sure that both sides of the stepladder are locked into place.  Ask someone to hold it for you, if possible.  Clearly mark and make sure that you can see:  Any grab bars or handrails.  First and last steps.  Where the edge of each step is.  Use tools that help you move around (mobility aids) if they are needed. These include:  Canes.  Walkers.  Scooters.  Crutches.  Turn on the  lights when you go into a dark area. Replace any light bulbs as soon as they burn out.  Set up your furniture so you have a clear path. Avoid moving your furniture around.  If any of your floors are uneven, fix them.  If there are any pets around you, be aware of where they are.  Review your medicines with your doctor. Some medicines can make you feel dizzy. This can increase your chance of falling. Ask your doctor what other things that you can do to help prevent falls. This information is not intended to replace advice given to you by your health care provider. Make sure you discuss any questions you have with your health care provider. Document Released: 06/14/2009 Document Revised: 01/24/2016 Document Reviewed: 09/22/2014 Elsevier Interactive Patient Education  2017 [employer].

## 2020-10-24 NOTE — Progress Notes (Signed)
Subjective:   Randy Hebert is a 55 y.o. male who presents for Medicare Annual/Subsequent preventive examination.  Review of Systems       Objective:    There were no vitals filed for this visit. There is no height or weight on file to calculate BMI.  Advanced Directives 11/28/2019 10/07/2019 05/17/2019 03/03/2019 10/06/2018 10/03/2018 12/04/2017  Does Patient Have a Medical Advance Directive? No No Yes No No No No  Type of Advance Directive - - [place]  Does patient want to make changes to medical advance directive? - - - - - - -  Copy of [place] in Chart? - - - - - - -  Would patient like information on creating a medical advance directive? No - Patient declined No - Patient declined Yes (ED - Information included in AVS) - - - No - Patient declined    Current Medications (verified) Outpatient Encounter Medications as of 10/24/2020  Medication Sig  . acetaminophen (TYLENOL) 500 MG tablet Take 1,000 mg by mouth every 4 (four) hours as needed for mild pain or fever.   Marland Kitchen albuterol (PROVENTIL) (2.5 MG/3ML) 0.083% nebulizer solution Take 3 mLs (2.5 mg total) by nebulization every 6 (six) hours as needed for wheezing or shortness of breath.  Marland Kitchen amLODipine (NORVASC) 10 MG tablet TAKE 1 TABLET BY MOUTH ONCE A DAY. (Patient taking differently: Take 10 mg by mouth daily.)  . ascorbic acid (VITAMIN C) 500 MG tablet Take 1 tablet (500 mg total) by mouth daily. (Patient taking differently: Take 500 mg by mouth every evening.)  . aspirin 81 MG EC tablet Take 1 tablet (81 mg total) by mouth daily. (Patient taking differently: Take 81 mg by mouth 2 (two) times daily.)  . atorvastatin (LIPITOR) 40 MG tablet Take 1 tablet (40 mg total) by mouth every evening.  Marland Kitchen azelastine (ASTELIN) 0.1 % nasal spray USE 2 SPRAYS IN EACH NOSTRIL TWICE DAILY.  . Carboxymeth-Glycerin-Polysorb 0.5-1-0.5 % SOLN Apply 1 drop to eye 2 (two) times daily as needed (dry eyes).   .  chlorthalidone (HYGROTON) 25 MG tablet Take 0.5 tablets (12.5 mg total) by mouth daily.  . Cholecalciferol (VITAMIN D3) 125 MCG (5000 UT) CAPS Take 1 capsule (5,000 Units total) by mouth daily. (Patient taking differently: Take 5,000 Units by mouth daily in the afternoon.)  . citalopram (CELEXA) 20 MG tablet Take 20 mg by mouth daily.  . cycloSPORINE (RESTASIS) 0.05 % ophthalmic emulsion Place 1 drop into both eyes 2 (two) times daily.  . ferrous sulfate 325 (65 FE) MG tablet Take 325 mg by mouth daily with breakfast.  . furosemide (LASIX) 40 MG tablet Take 1 tablet (40 mg total) by mouth daily as needed for fluid.  Marland Kitchen glipiZIDE (GLUCOTROL XL) 5 MG 24 hr tablet TAKE 1 TABLET BY MOUTH DAILY WITH BREAKFAST.  Marland Kitchen glucose blood (ACCU-CHEK AVIVA PLUS) test strip Use as instructed to check blood glucose once daily, DX E11.59  . magnesium gluconate (MAGONATE) 500 MG tablet Take 500 mg by mouth 2 (two) times daily.  . metFORMIN (GLUCOPHAGE) 1000 MG tablet Take 1 tablet (1,000 mg total) by mouth 2 (two) times daily with a meal.  . mometasone (ELOCON) 0.1 % cream Apply 1 application topically 2 (two) times a week.   . nitroGLYCERIN (NITROSTAT) 0.4 MG SL tablet Place 1 tablet (0.4 mg total) under the tongue every 5 (five) minutes as needed for chest pain.  . pantoprazole (  PROTONIX) 40 MG tablet Take 1 tablet (40 mg total) by mouth 2 (two) times daily before a meal.  . potassium chloride SA (KLOR-CON) 20 MEQ tablet Take 2 tablets (40 mEq total) by mouth daily.  . prednisoLONE acetate (PRED FORTE) 1 % ophthalmic suspension Place 4 drops into both eyes 2 (two) times a week.   Marland Kitchen PROVENTIL HFA 108 (90 Base) MCG/ACT inhaler INHALE 2 PUFFS INTO THE LUNGS EVERY 6 HOURS AS NEEDED FOR SHORTNESS OF BREATH OR WHEEZING. (Patient taking differently: Inhale 2 puffs into the lungs every 6 (six) hours as needed for wheezing or shortness of breath.)  . SPIRIVA RESPIMAT 2.5 MCG/ACT AERS INHALE 1 PUFF INTO THE LUNGS DAILY.  .  SYMBICORT 160-4.5 MCG/ACT inhaler INHALE 2 PUFFS INTO THE LUNGS TWO TIMES DAILY.   No facility-administered encounter medications on file as of 10/24/2020.    Allergies (verified) Carvedilol, Lisinopril, and Amoxicillin   History: Past Medical History:  Diagnosis Date  . ACE inhibitor-aggravated angioedema   . Allergy   . Angio-edema   . Anxiety   . Arthritis   . Asthma    hip replacement  . Back pain   . Bradycardia 04/28/2017  . Bulging of cervical intervertebral disc   . CAD (coronary artery disease)    lateral STEMI 02/06/2015 00% D1 occlusion treated with Promus Premier 2.5 mm x 16 mm DES, 70% ramus stenosis, 40% mid RCA stenosis, 45% distal RCA stenosis, EF 45-50%  . CHF (congestive heart failure) ([place])   . COPD (chronic obstructive pulmonary disease) ([place])   . Depression   . Diabetes mellitus without complication ([place])   . 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 Dry eye   . Dyspnea   . Early satiety 09/23/2016  . GERD (gastroesophageal reflux disease)   . HCAP (healthcare-associated pneumonia) 05/15/2017  . Headache   . Heart murmur   . Hip pain   . Hyperlipidemia   . Hypertension   . Lobar pneumonia ([place]) 05/15/2017  . Melena 08/04/2018  . MI (myocardial infarction) ([place])   . Myocardial infarction ([place])   . Neck pain   . Non-ST elevation (NSTEMI) myocardial infarction ([place]) 04/27/2017  . NSTEMI (non-ST elevated myocardial infarction) ([place]) 04/26/2017  . Otitis media   . Pleurisy   . Pneumonia due to COVID-19 virus 10/07/2019  . Rectal bleeding 11/08/2018  . Right shoulder pain 03/20/2020  . Sinus pause    9 sec sinus pause on telemetry after started on coreg after MI, avoid AV nodal blocking agent  . Sleep apnea   . 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 STEMI (ST elevation myocardial infarction) ([place]) 05/31/2015  . 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 Syncope 06/29/2017  . Syncope and collapse 05/15/2017  .  Transaminitis 08/04/2018  . Unilateral primary osteoarthritis, right hip 07/01/2016   Past Surgical History:  Procedure Laterality Date  . BIOPSY  10/09/2016   Procedure: BIOPSY;  Surgeon: Daneil Dolin, MD;  [facility];  Service: Endoscopy;;  . BIOPSY  11/28/2019   Procedure: BIOPSY;  Surgeon: Daneil Dolin, MD;  [facility];  Service: Endoscopy;;  . CARDIAC CATHETERIZATION N/A 02/06/2015   Procedure: Left Heart Cath and Coronary Angiography;  Surgeon: Leonie Man, MD;  Location: [facility];  Service: Cardiovascular;  Laterality: N/A;  . CARDIAC CATHETERIZATION N/A 02/06/2015   Procedure: Coronary Stent Intervention;  Surgeon: Shanon Brow  Loren Racer, MD;  Location: [facility];  Service: Cardiovascular;  Laterality: N/A;  . COLONOSCOPY WITH PROPOFOL N/A 10/09/2016   Sigmoid and descending colon diverticulosis, four 4-6 mm polyps in sigmoid, one 4 mm polyp in descending. Tubular adenomas and hyperplastic. 5 year surveillance.   . COLONOSCOPY WITH PROPOFOL N/A 11/24/2016   Sigmoid and descending colon diverticulosis, four 4-6 mm polyps in sigmoid, one 4 mm polyp in descending. Tubular adenomas and hyperplastic. 5 year surveillance.   . CORONARY ANGIOPLASTY WITH STENT PLACEMENT  01/2015  . CORONARY STENT INTERVENTION N/A 04/27/2017   Procedure: CORONARY STENT INTERVENTION;  Surgeon: Nelva Bush, MD;  Location: [facility];  Service: Cardiovascular;  Laterality: N/A;  . ELECTROPHYSIOLOGY STUDY N/A 06/29/2017   Procedure: ELECTROPHYSIOLOGY STUDY;  Surgeon: Evans Lance, MD;  Location: [facility];  Service: Cardiovascular;  Laterality: N/A;  . ESOPHAGOGASTRODUODENOSCOPY (EGD) WITH PROPOFOL N/A 10/09/2016   Dr. Gala Romney: LA grade a esophagitis.  Barrett's esophagus, biopsy-proven.  Small hiatal hernia.  EGD February 2019  . ESOPHAGOGASTRODUODENOSCOPY (EGD) WITH PROPOFOL N/A 11/28/2019    salmon-colored esophageal mucosa (Barrett's) small hiatal hernia,  portal hypertensive gastropathy, normal duodenum, 3 year surveillance  . INCISION / DRAINAGE HAND / FINGER    . LEFT HEART CATH AND CORONARY ANGIOGRAPHY N/A 04/27/2017   Procedure: LEFT HEART CATH AND CORONARY ANGIOGRAPHY;  Surgeon: Nelva Bush, MD;  Location: [facility];  Service: Cardiovascular;  Laterality: N/A;  . LOOP RECORDER INSERTION  06/29/2017   Procedure: Loop Recorder Insertion;  Surgeon: Evans Lance, MD;  Location: [facility];  Service: Cardiovascular;;  . POLYPECTOMY  11/24/2016   Procedure: POLYPECTOMY;  Surgeon: Daneil Dolin, MD;  [facility];  Service: Endoscopy;;  descending and sigmoid  . TOTAL HIP ARTHROPLASTY Right 07/01/2016  . TOTAL HIP ARTHROPLASTY Right 07/01/2016   Procedure: RIGHT TOTAL HIP ARTHROPLASTY ANTERIOR APPROACH;  Surgeon: Mcarthur Rossetti, MD;  Location: [facility];  Service: Orthopedics;  Laterality: Right;   Family History  Problem Relation Age of Onset  . Heart attack Father   . Stroke Father   . Arthritis Father   . Heart disease Father   . Cancer Mother        ???  . Arthritis Mother   . Heart disease Brother 47       died in sleep  . Early death Brother   . Diabetes Maternal Uncle   . Alzheimer's disease Maternal Grandmother    Social History   Socioeconomic History  . Marital status: Divorced    Spouse name: alice  . Number of children: 3  . Years of education: 79  . Highest education level: Not on file  Occupational History  . Occupation: SSI  Tobacco Use  . Smoking status: Current Every Day Smoker    Packs/day: 1.00    Years: 46.00    Pack years: 46.00    Types: Cigarettes  . Smokeless tobacco: Former Systems developer    Types: Secondary school teacher  . Vaping Use: Former  Substance and Sexual Activity  . Alcohol use: Yes    Alcohol/week: 0.0 standard drinks    Comment: half a pint every other day  . Drug use: No    Comment: history of drug use- marijuana, cocaine- quit 2014  . Sexual activity: Yes     Partners: Female    Birth control/protection: None  Other Topics Concern  . Not on file  Social History Narrative  Lives with girlfriend [name]   3 children, but 1 OD  In 2019.   Grandchildren-2      Two cats: may and princess; and a dog-foxy      Enjoy: spending time with pets, TV, yard work       Diet: eats all food groups   Caffeine: half and half coffee, some tea-half and half decaf   Water: 6-8 cups daily      Wears seat belt   Does not use phone while driving   Oceanographer at home      Social Determinants of Health   Financial Resource Strain: Low Risk   . Difficulty of Paying Living Expenses: Not hard at all  Food Insecurity: No Food Insecurity  . Worried About Charity fundraiser in the Last Year: Never true  . Ran Out of Food in the Last Year: Never true  Transportation Needs: No Transportation Needs  . Lack of Transportation (Medical): No  . Lack of Transportation (Non-Medical): No  Physical Activity: Insufficiently Active  . Days of Exercise per Week: 7 days  . Minutes of Exercise per Session: 20 min  Stress: No Stress Concern Present  . Feeling of Stress : Not at all  Social Connections: Moderately Integrated  . Frequency of Communication with Friends and Family: More than three times a week  . Frequency of Social Gatherings with Friends and Family: More than three times a week  . Attends Religious Services: More than 4 times per year  . Active Member of Clubs or Organizations: No  . Attends Archivist Meetings: Never  . Marital Status: Living with partner    Tobacco Counseling Ready to quit: Not Answered Counseling given: Not Answered   Clinical Intake:                 Diabetic? yes         Activities of Daily Living No flowsheet data found.  Patient Care Team: Perlie Mayo, NP as PCP - General (Family Medicine) Evans Lance, MD as PCP - Electrophysiology (Cardiology) [name] Alphonse Guild, MD as PCP -  Cardiology (Cardiology)  Indicate any recent Medical Services you may have received from other than [facility] providers in the past year (date may be approximate).     Assessment:   This is a routine wellness examination for Ezrael.  Hearing/Vision screen No exam data present  Dietary issues and exercise activities discussed:    Goals    . Quit Smoking    . Quit smoking / using tobacco    . Quit smoking / using tobacco      Depression Screen PHQ 2/9 Scores 10/17/2020 06/20/2020 06/20/2020 03/20/2020 12/08/2019 06/30/2018 02/25/2018  PHQ - 2 Score 0 0 0 0 0 0 0  PHQ- 9 Score - - - - - - -  Exception Documentation - - - - - - -  Not completed - - - - - - -    Fall Risk Fall Risk  10/17/2020 06/20/2020 03/20/2020 12/08/2019 11/09/2018  Falls in the past year? 1 0 1 0 0  Number falls in past yr: 1 0 0 0 -  Injury with Fall? 0 0 1 0 -  Risk Factor Category  - - - - -  Comment - - - - -  Risk for fall due to : Other (Comment) No Fall Risks History of fall(s) - -  Risk for fall due to: Comment weather- ice - - - -  Follow up Falls evaluation completed Falls evaluation completed Falls evaluation completed;Education provided;Falls prevention discussed - Falls evaluation completed    FALL RISK PREVENTION PERTAINING TO THE HOME:  Any stairs in or around the home? No  If so, are there any without handrails? n/a Home free of loose throw rugs in walkways, pet beds, electrical cords, etc? Yes  Adequate lighting in your home to reduce risk of falls? Yes   ASSISTIVE DEVICES UTILIZED TO PREVENT FALLS:  Life alert? No  Use of a cane, Nabi or w/c? Yes  Grab bars in the bathroom? Yes  Shower chair or bench in shower? Yes  Elevated toilet seat or a handicapped toilet? Yes   TIMED UP AND GO:  Was the test performed? No .  Length of time to ambulate n/a    Cognitive Function:        Immunizations Immunization History  Administered Date(s) Administered  . Influenza,inj,Quad PF,6+ Mos  07/03/2016, 05/07/2017, 06/20/2020  . Influenza-Unspecified 05/17/2018  . Moderna Sars-Covid-2 Vaccination 02/20/2020  . Pneumococcal Conjugate-13 06/17/2017  . Pneumococcal Polysaccharide-23 02/07/2015  . Tdap 05/17/2019    TDAP status: Up to date  Flu Vaccine status: Up to date  Pneumococcal vaccine status: Up to date  Covid-19 vaccine status: Completed vaccines  Qualifies for Shingles Vaccine? Yes   Zostavax completed No   Shingrix Completed?: No.    Education has been provided regarding the importance of this vaccine. Patient has been advised to call insurance company to determine out of pocket expense if they have not yet received this vaccine. Advised may also receive vaccine at local pharmacy or Health Dept. Verbalized acceptance and understanding.  Screening Tests Health Maintenance  Topic Date Due  . COVID-19 Vaccine (2 - Moderna 3-dose series) 03/19/2020  . URINE MICROALBUMIN  11/08/2020  . OPHTHALMOLOGY EXAM  02/15/2021  . HEMOGLOBIN A1C  04/16/2021  . FOOT EXAM  06/20/2021  . COLONOSCOPY (Pts 45-23yrs Insurance coverage will need to be confirmed)  11/25/2026  . TETANUS/TDAP  05/16/2029  . INFLUENZA VACCINE  Completed  . PNEUMOCOCCAL POLYSACCHARIDE VACCINE AGE 68-64 HIGH RISK  Completed  . Hepatitis C Screening  Completed  . HIV Screening  Completed    Health Maintenance  Health Maintenance Due  Topic Date Due  . COVID-19 Vaccine (2 - Moderna 3-dose series) 03/19/2020  . URINE MICROALBUMIN  11/08/2020    Colorectal cancer screening: Type of screening: Colonoscopy. Completed 11/24/16. Repeat every 10 years  Lung Cancer Screening: (Low Dose CT Chest recommended if Age 83-80 years, 30 pack-year currently smoking OR have quit w/in 15years.) does qualify.   Lung Cancer Screening Referral: Ordered today.  Additional Screening:  Hepatitis C Screening: does not qualify; Completed  Vision Screening: Recommended annual ophthalmology exams for early detection of  glaucoma and other disorders of the eye. Is the patient up to date with their annual eye exam?  Yes  Who is the provider or what is the name of the office in which the patient attends annual eye exams? [facility] in [place] If pt is not established with a provider, would they like to be referred to a provider to establish care? n/a.   Dental Screening: Recommended annual dental exams for proper oral hygiene  Community Resource Referral / Chronic Care Management: CRR required this visit?  No   CCM required this visit?  No      Plan:     I have personally reviewed and noted the following in the patient's chart:   .  Medical and social history . Use of alcohol, tobacco or illicit drugs  . Current medications and supplements . Functional ability and status . Nutritional status . Physical activity . Advanced directives . List of other physicians . Hospitalizations, surgeries, and ER visits in previous 12 months . Vitals . Screenings to include cognitive, depression, and falls . Referrals and appointments  In addition, I have reviewed and discussed with patient certain preventive protocols, quality metrics, and best practice recommendations. A written personalized care plan for preventive services as well as general preventive health recommendations were provided to patient.     Laretta Bolster, [place]   [id number]   Nurse Notes: AWV conducted by nurse in office with patient also in office. Patient gave consent for visit today. Visit took 40 minutes to complete.

## 2020-10-25 LAB — CBC WITH DIFFERENTIAL/PLATELET
Basophils Absolute: 0.1 10*3/uL (ref 0.0–0.2)
Basos: 1 %
EOS (ABSOLUTE): 0.2 10*3/uL (ref 0.0–0.4)
Eos: 4 %
Hematocrit: 39.1 % (ref 37.5–51.0)
Hemoglobin: 11.8 g/dL — ABNORMAL LOW (ref 13.0–17.7)
Immature Grans (Abs): 0 10*3/uL (ref 0.0–0.1)
Immature Granulocytes: 0 %
Lymphocytes Absolute: 1.4 10*3/uL (ref 0.7–3.1)
Lymphs: 22 %
MCH: 21.8 pg — ABNORMAL LOW (ref 26.6–33.0)
MCHC: 30.2 g/dL — ABNORMAL LOW (ref 31.5–35.7)
MCV: 72 fL — ABNORMAL LOW (ref 79–97)
Monocytes Absolute: 0.5 10*3/uL (ref 0.1–0.9)
Monocytes: 9 %
Neutrophils Absolute: 3.9 10*3/uL (ref 1.4–7.0)
Neutrophils: 64 %
Platelets: 296 10*3/uL (ref 150–450)
RBC: 5.41 x10E6/uL (ref 4.14–5.80)
RDW: 17.9 % — ABNORMAL HIGH (ref 11.6–15.4)
WBC: 6 10*3/uL (ref 3.4–10.8)

## 2020-10-25 LAB — LIPID PANEL WITH LDL/HDL RATIO
Cholesterol, Total: 135 mg/dL (ref 100–199)
HDL: 34 mg/dL — ABNORMAL LOW (ref >39)
LDL Chol Calc (NIH): 83 mg/dL (ref 0–99)
LDL/HDL Ratio: 2.4 ratio (ref 0.0–3.6)
Triglycerides: 94 mg/dL (ref 0–149)
VLDL Cholesterol Cal: 18 mg/dL (ref 5–40)

## 2020-10-25 LAB — CMP14+EGFR
ALT: 20 IU/L (ref 0–44)
AST: 28 IU/L (ref 0–40)
Albumin/Globulin Ratio: 1.4 (ref 1.2–2.2)
Albumin: 4.2 g/dL (ref 3.8–4.9)
Alkaline Phosphatase: 164 IU/L — ABNORMAL HIGH (ref 44–121)
BUN/Creatinine Ratio: 11 (ref 9–20)
BUN: 7 mg/dL (ref 6–24)
Bilirubin Total: 1 mg/dL (ref 0.0–1.2)
CO2: 23 mmol/L (ref 20–29)
Calcium: 9.7 mg/dL (ref 8.7–10.2)
Chloride: 96 mmol/L (ref 96–106)
Creatinine, Ser: 0.64 mg/dL — ABNORMAL LOW (ref 0.76–1.27)
GFR calc Af Amer: 127 mL/min/{1.73_m2} (ref >59)
GFR calc non Af Amer: 110 mL/min/{1.73_m2} (ref >59)
Globulin, Total: 3.1 g/dL (ref 1.5–4.5)
Glucose: 131 mg/dL — ABNORMAL HIGH (ref 65–99)
Potassium: 4.1 mmol/L (ref 3.5–5.2)
Sodium: 134 mmol/L (ref 134–144)
Total Protein: 7.3 g/dL (ref 6.0–8.5)

## 2020-10-25 NOTE — Progress Notes (Signed)
His hemoglobin is running low today. I added an iron panel to his labs, but we need to get hemoccult cards to make sure he is not having any GI bleeding. Has he noticed any bleeding?

## 2020-10-25 NOTE — Progress Notes (Signed)
Cc'ed to pcp °

## 2020-10-26 ENCOUNTER — Other Ambulatory Visit: Payer: Self-pay | Admitting: Nurse Practitioner

## 2020-10-26 LAB — IRON AND TIBC
Iron Saturation: 8 % — CL (ref 15–55)
Iron: 42 ug/dL (ref 38–169)
Total Iron Binding Capacity: 510 ug/dL — ABNORMAL HIGH (ref 250–450)
UIBC: 468 ug/dL — ABNORMAL HIGH (ref 111–343)

## 2020-10-26 LAB — FERRITIN: Ferritin: 15 ng/mL — ABNORMAL LOW (ref 30–400)

## 2020-10-26 LAB — SPECIMEN STATUS REPORT

## 2020-10-26 MED ORDER — FERROUS SULFATE 325 (65 FE) MG PO TABS: 325.0000 mg | ORAL_TABLET | Freq: Every day | ORAL | 1 refills | Status: DC

## 2020-10-26 NOTE — Progress Notes (Signed)
The iron panel came back, and he has iron deficiency anemia. I sent in an iron supplement, but he may have been taking this already. After he has been taking the iron for a month, we will need to recheck his blood count and iron levels. If the iron levels do not improve, we will consider an iron infusion.

## 2020-10-29 ENCOUNTER — Telehealth: Payer: Self-pay

## 2020-10-29 ENCOUNTER — Ambulatory Visit (HOSPITAL_COMMUNITY)
Admission: RE | Admit: 2020-10-29 | Discharge: 2020-10-29 | Disposition: A | Discharge status: Home / Self Care | Payer: Medicare Other | Source: Ambulatory Visit | Attending: Gastroenterology | Admitting: Gastroenterology

## 2020-10-29 ENCOUNTER — Other Ambulatory Visit: Payer: Self-pay

## 2020-10-29 DIAGNOSIS — R188 Other ascites: Secondary | ICD-10-CM | POA: Diagnosis not present

## 2020-10-29 DIAGNOSIS — D508 Other iron deficiency anemias: Secondary | ICD-10-CM

## 2020-10-29 DIAGNOSIS — K760 Fatty (change of) liver, not elsewhere classified: Secondary | ICD-10-CM | POA: Insufficient documentation

## 2020-10-29 MED ORDER — FERROUS SULFATE 325 (65 FE) MG PO TABS: 325.0000 mg | ORAL_TABLET | Freq: Every day | ORAL | 1 refills | Status: DC

## 2020-10-29 NOTE — Telephone Encounter (Signed)
Call report received from [facility]. Impression 1. Subtle nodularity of the hepatic contours with coarsened parenchymal echotexture suspicious for cirrhosis. No focal hepatic lesion. Minimal perihepatic ascites is noted. 2. Multiple nonshadowing echogenic structures in the gallbladder lumen may be tumefactive sludge or polyps. The largest measures 7 mm. Follow-up ultrasound should be performed in 6 months to again evaluate these structures.  These results will be called to the ordering clinician or representative by the Radiologist Assistant, and communication documented in the PACS or [employer].

## 2020-10-29 NOTE — Telephone Encounter (Signed)
Addressed in result notes  

## 2020-11-02 ENCOUNTER — Telehealth: Payer: Self-pay

## 2020-11-02 ENCOUNTER — Ambulatory Visit (INDEPENDENT_AMBULATORY_CARE_PROVIDER_SITE_OTHER): Payer: Medicare Other

## 2020-11-02 DIAGNOSIS — J309 Allergic rhinitis, unspecified: Secondary | ICD-10-CM

## 2020-11-02 DIAGNOSIS — D508 Other iron deficiency anemias: Secondary | ICD-10-CM

## 2020-11-02 NOTE — Telephone Encounter (Signed)
Good.

## 2020-11-02 NOTE — Telephone Encounter (Signed)
Pt brought a hemoccult stool card in, results were negative. I have entered these results. Just FYI.

## 2020-11-02 NOTE — Telephone Encounter (Signed)
Pt informed

## 2020-11-06 ENCOUNTER — Encounter (HOSPITAL_COMMUNITY): Payer: Self-pay

## 2020-11-06 NOTE — Progress Notes (Signed)
Received referral for initial lung cancer screening scan. I contacted the patient today who requests to call me back tomorrow. I provided my name and contact information for the patient to do so.

## 2020-11-07 ENCOUNTER — Encounter (HOSPITAL_COMMUNITY): Payer: Self-pay

## 2020-11-07 NOTE — Progress Notes (Signed)
Received referral for initial lung cancer screening scan. Contacted patient and obtained smoking history (started age 55 smoking around 1/2PPD until the age of 52 when he increased to 1PPD, current smoker continuing to smoke 1PPD, 36.5 pack year) as well as answering questions related to the screening process. Patient denies signs/symptoms of lung cancer such as weight loss or hemoptysis. Patient denies comorbidity that would prevent curative treatment if lung cancer were to be found.  Patient is scheduled for shared decision making visit on 11/15/2020 at 1140.

## 2020-11-08 NOTE — Patient Instructions (Addendum)
Asthma COPD overlap syndrome Continue Symbicort 160-2 puffs twice a day with a spacer to prevent cough or wheeze Continue Spiriva 2.5 mcg 1 puff once a day to prevent cough or wheeze Continue albuterol 2 puffs every 4 hours as needed for cough or wheeze You may use albuterol 2 puffs 5 to 15 minutes before activity to decrease cough or wheeze  Allergic rhinitis Continue allergen avoidance measures directed toward grass pollen, weed pollen, dog, cat, mold, and cockroach Continue allergen immunotherapy once every 2 weeks and have access to an epinephrine autoinjector set Continue cetirizine 10 mg once a day as needed for runny nose Continue azelastine 1 to 2 sprays in each nostril twice a day as needed for runny nose Consider saline nasal rinses as needed for nasal symptoms. Use this before any medicated nasal sprays for best result  Tobacco use Quit smoking or try to cut down on your cigarette use Keep the appointment for a screening CT scan next week  Call the clinic if this treatment plan is not working well for you  Follow up in 6 months or sooner if needed.

## 2020-11-08 NOTE — Progress Notes (Signed)
[place], [place] [id number] Dept: [phone]  FOLLOW UP NOTE  Patient ID: [name], male    DOB: [birth year]  Age: 55 y.o. MRN: [id number] Date of Office Visit: 11/09/2020  Assessment  Chief Complaint: Allergic Rhinitis   HPI IGNAZIO KINCAID is a 55 year old male who presents to the clinic for a follow up visit. He was last seen in this clinic on 11/11/2019 for evaluation of asthma/COPD, and allergic rhinitis on allergen immunotherapy injections. At today's visit, he reports his asthma has been moderately well controlled with a moderate amount of shortness of breath, wheeze and cough producing mucus. He continues Symbicort 160-2 puffs twice a day with a spacer, Spiriva 1.25 mcg 2 puffs once a day and frequent use of albuterol. He is not currently using a spacer with his Symbicort or albuterol.  He reports that he is currently using a wood stove for heat and kerosene for cooking.  He continues to smoke 1 pack of cigarettes a day. Allergic rhinitis is reported as well controlled at this time with Allegra, azelastine nasal spray, and occasional nasal saline rinses. He continues allergy immunotherapy with no large local reactions. He reports a significant decrease in his symptoms of allergic rhinitis while continuing on allergen immunotherapy. His current medications are listed in the chart.   Drug Allergies:  Allergies  Allergen Reactions  . Carvedilol Other (See Comments)    Sinus pause on telemetry >3 seconds. Longest one 9 sec. No AV nodal agent  . Lisinopril Anaphylaxis, Shortness Of Breath and Swelling    Angioedema, required intubation and mechanical ventilation  . Amoxicillin Nausea And Vomiting and Nausea Only    Nausea only - no allergy  Did it involve swelling of the face/tongue/throat, SOB, or low BP? No Did it involve sudden or severe rash/hives, skin peeling, or any reaction on the inside of your mouth or nose? No Did you need to seek medical  attention at a hospital or doctor's office? No When did it last happen?childhood reaction If all above answers are "NO", may proceed with cephalosporin use.     Physical Exam: BP 122/80 (BP Location: Left Arm, Patient Position: Sitting, Cuff Size: Normal)   Pulse 79   Temp 97.7 F (36.5 C) (Temporal)   Resp 18   Ht 5\' 5"  (1.651 m)   Wt 182 lb 6.4 oz (82.7 kg)   SpO2 99%   BMI 30.35 kg/m    Physical Exam Vitals reviewed.  Constitutional:      Appearance: Normal appearance.  HENT:     Head: Normocephalic and atraumatic.     Right Ear: Tympanic membrane normal.     Left Ear: Tympanic membrane normal.     Nose:     Comments: Bilateral nares normal. Pharynx normal. Cauliflower like texture to bilateral ears. TM's normal. Eyes normal.    Mouth/Throat:     Pharynx: Oropharynx is clear.  Eyes:     Conjunctiva/sclera: Conjunctivae normal.  Cardiovascular:     Rate and Rhythm: Normal rate and regular rhythm.     Heart sounds: Normal heart sounds. No murmur heard.   Pulmonary:     Effort: Pulmonary effort is normal.     Breath sounds: Normal breath sounds.     Comments: Lungs clear to auscultation  Musculoskeletal:        General: Normal range of motion.     Cervical back: Normal range of motion and neck supple.  Skin:  General: Skin is warm and dry.  Neurological:     Mental Status: He is alert and oriented to person, place, and time.  Psychiatric:        Mood and Affect: Mood normal.        Behavior: Behavior normal.        Thought Content: Thought content normal.        Judgment: Judgment normal.     Diagnostics: FVC 1.76, FEV1 1.45. Predicted FVC 4.07, predicted FEV1 3.12. Spirometry indicates severe restriction. Post bronchodilator therapy FVC 1.91, FEV1 1.59. Post bronchodilator therapy indicates 10% improvement in FEV1.   Assessment and Plan: 1. Asthma-COPD overlap syndrome ([place])   2. Allergic rhinitis, unspecified seasonality, unspecified trigger    3. Seasonal and perennial allergic rhinitis   4. History of COVID-19   5. Tobacco use     Meds ordered this encounter  Medications  . DISCONTD: Tiotropium Bromide Monohydrate (SPIRIVA RESPIMAT) 2.5 MCG/ACT AERS    Sig: Inhale 1 puff into the lungs daily.    Dispense:  4 g    Refill:  0    Last fill needs appt  . DISCONTD: budesonide-formoterol (SYMBICORT) 160-4.5 MCG/ACT inhaler    Sig: Inhale 2 puffs into the lungs 2 (two) times daily.    Dispense:  10.2 g    Refill:  0    Last fill needs appt  . albuterol (PROVENTIL HFA) 108 (90 Base) MCG/ACT inhaler    Sig: INHALE 2 PUFFS INTO THE LUNGS EVERY 6 HOURS AS NEEDED FOR SHORTNESS OF BREATH OR WHEEZING.    Dispense:  18 g    Refill:  1  . budesonide-formoterol (SYMBICORT) 160-4.5 MCG/ACT inhaler    Sig: Inhale 2 puffs into the lungs 2 (two) times daily.    Dispense:  10.2 g    Refill:  5  . Tiotropium Bromide Monohydrate (SPIRIVA RESPIMAT) 2.5 MCG/ACT AERS    Sig: Inhale 1 puff into the lungs daily.    Dispense:  4 g    Refill:  5    Patient Instructions  Asthma COPD overlap syndrome Continue Symbicort 160-2 puffs twice a day with a spacer to prevent cough or wheeze Continue Spiriva 2.5 mcg 1 puff once a day to prevent cough or wheeze Continue albuterol 2 puffs every 4 hours as needed for cough or wheeze You may use albuterol 2 puffs 5 to 15 minutes before activity to decrease cough or wheeze  Allergic rhinitis Continue allergen avoidance measures directed toward grass pollen, weed pollen, dog, cat, mold, and cockroach Continue allergen immunotherapy once every 2 weeks and have access to an epinephrine autoinjector set Continue cetirizine 10 mg once a day as needed for runny nose Continue azelastine 1 to 2 sprays in each nostril twice a day as needed for runny nose Consider saline nasal rinses as needed for nasal symptoms. Use this before any medicated nasal sprays for best result  Tobacco use Quit smoking or try to cut  down on your cigarette use Keep the appointment for a screening CT scan next week  Call the clinic if this treatment plan is not working well for you  Follow up in 6 months or sooner if needed.   Return in about 6 months (around 05/12/2021), or if symptoms worsen or fail to improve.    Thank you for the opportunity to care for this patient.  Please do not hesitate to contact me with questions.  Gareth Morgan, FNP [facility]

## 2020-11-09 ENCOUNTER — Encounter: Payer: Self-pay | Admitting: Family Medicine

## 2020-11-09 ENCOUNTER — Other Ambulatory Visit: Payer: Self-pay

## 2020-11-09 ENCOUNTER — Ambulatory Visit (INDEPENDENT_AMBULATORY_CARE_PROVIDER_SITE_OTHER): Payer: Medicare Other | Admitting: Family Medicine

## 2020-11-09 VITALS — BP 122/80 | HR 79 | Temp 97.7°F | Resp 18 | Ht 65.0 in | Wt 182.4 lb

## 2020-11-09 DIAGNOSIS — J449 Chronic obstructive pulmonary disease, unspecified: Secondary | ICD-10-CM | POA: Diagnosis not present

## 2020-11-09 DIAGNOSIS — J302 Other seasonal allergic rhinitis: Secondary | ICD-10-CM

## 2020-11-09 DIAGNOSIS — J309 Allergic rhinitis, unspecified: Secondary | ICD-10-CM | POA: Diagnosis not present

## 2020-11-09 DIAGNOSIS — Z720 Tobacco use: Secondary | ICD-10-CM

## 2020-11-09 DIAGNOSIS — J3089 Other allergic rhinitis: Secondary | ICD-10-CM

## 2020-11-09 DIAGNOSIS — Z8616 Personal history of COVID-19: Secondary | ICD-10-CM | POA: Diagnosis not present

## 2020-11-09 MED ORDER — ALBUTEROL SULFATE HFA 108 (90 BASE) MCG/ACT IN AERS: INHALATION_SPRAY | RESPIRATORY_TRACT | 1 refills | Status: DC

## 2020-11-09 MED ORDER — BUDESONIDE-FORMOTEROL FUMARATE 160-4.5 MCG/ACT IN AERO: 2.0000 | INHALATION_SPRAY | Freq: Two times a day (BID) | RESPIRATORY_TRACT | 5 refills | Status: DC

## 2020-11-09 MED ORDER — BUDESONIDE-FORMOTEROL FUMARATE 160-4.5 MCG/ACT IN AERO: 2.0000 | INHALATION_SPRAY | Freq: Two times a day (BID) | RESPIRATORY_TRACT | 0 refills | Status: DC

## 2020-11-09 MED ORDER — SPIRIVA RESPIMAT 2.5 MCG/ACT IN AERS: 1.0000 | INHALATION_SPRAY | Freq: Every day | RESPIRATORY_TRACT | 0 refills | Status: DC

## 2020-11-09 MED ORDER — SPIRIVA RESPIMAT 2.5 MCG/ACT IN AERS: 1.0000 | INHALATION_SPRAY | Freq: Every day | RESPIRATORY_TRACT | 5 refills | Status: DC

## 2020-11-13 ENCOUNTER — Telehealth: Payer: Self-pay | Admitting: Internal Medicine

## 2020-11-13 NOTE — Telephone Encounter (Signed)
Lmom, waiting on a return call.  

## 2020-11-13 NOTE — Telephone Encounter (Signed)
[phone]  PLEASE CALL PATIENT, HE HAS QUESTIONS ABOUT HIS ULTRASOUND

## 2020-11-14 ENCOUNTER — Ambulatory Visit (INDEPENDENT_AMBULATORY_CARE_PROVIDER_SITE_OTHER): Payer: Medicare Other | Admitting: *Deleted

## 2020-11-14 DIAGNOSIS — J309 Allergic rhinitis, unspecified: Secondary | ICD-10-CM

## 2020-11-14 NOTE — Telephone Encounter (Signed)
Spoke with pt. We discussed his imagine results and avoiding alcohol per Roseanne Kaufman, NP.

## 2020-11-15 ENCOUNTER — Inpatient Hospital Stay (HOSPITAL_COMMUNITY): Payer: Medicare Other | Attending: Oncology | Admitting: Oncology

## 2020-11-15 DIAGNOSIS — F17210 Nicotine dependence, cigarettes, uncomplicated: Secondary | ICD-10-CM | POA: Diagnosis not present

## 2020-11-15 DIAGNOSIS — Z87891 Personal history of nicotine dependence: Secondary | ICD-10-CM

## 2020-11-15 NOTE — Progress Notes (Signed)
Virtual Visit via Video Note  I connected with Randy Hebert on 11/15/20 at 11:40 AM EDT by a video enabled telemedicine application and verified that I am speaking with the correct person using two identifiers.  Location: Patient: Home Provider: Clinic    I discussed the limitations of evaluation and management by telemedicine and the availability of in person appointments. The patient expressed understanding and agreed to proceed.  I discussed the assessment and treatment plan with the patient. The patient was provided an opportunity to ask questions and all were answered. The patient agreed with the plan and demonstrated an understanding of the instructions.   The patient was advised to call back or seek an in-person evaluation if the symptoms worsen or if the condition fails to improve as anticipated.   In accordance with CMS guidelines, patient has met eligibility criteria including age, absence of signs or symptoms of lung cancer.  Social History   Tobacco Use  . Smoking status: Current Every Day Smoker    Packs/day: 1.00    Years: 46.00    Pack years: 46.00    Types: Cigarettes  . Smokeless tobacco: Former Systems developer    Types: Secondary school teacher  . Vaping Use: Former  Substance Use Topics  . Alcohol use: Yes    Alcohol/week: 0.0 standard drinks    Comment: half a pint every other day  . Drug use: No    Comment: history of drug use- marijuana, cocaine- quit 2014      A shared decision-making session was conducted prior to the performance of CT scan. This includes one or more decision aids, includes benefits and harms of screening, follow-up diagnostic testing, over-diagnosis, false positive rate, and total radiation exposure.   Counseling on the importance of adherence to annual lung cancer LDCT screening, impact of co-morbidities, and ability or willingness to undergo diagnosis and treatment is imperative for compliance of the program.   Counseling on the importance of continued  smoking cessation for former smokers; the importance of smoking cessation for current smokers, and information about tobacco cessation interventions have been given to patient including [place] and [facility] quit [place] programs.   Written order for lung cancer screening with LDCT has been given to the patient and any and all questions have been answered to the best of my abilities.    Yearly follow up will be coordinated by Burgess Estelle, Thoracic Navigator.  I provided 15 minutes of non face-to-face telephone visit time during this encounter, and > 50% was spent counseling as documented under my assessment & plan.   Jacquelin Hawking, NP

## 2020-11-19 ENCOUNTER — Other Ambulatory Visit (HOSPITAL_COMMUNITY): Payer: Self-pay

## 2020-11-19 DIAGNOSIS — Z87891 Personal history of nicotine dependence: Secondary | ICD-10-CM

## 2020-11-19 DIAGNOSIS — Z122 Encounter for screening for malignant neoplasm of respiratory organs: Secondary | ICD-10-CM

## 2020-11-21 ENCOUNTER — Ambulatory Visit: Payer: Medicare Other | Admitting: Family Medicine

## 2020-11-26 ENCOUNTER — Telehealth: Payer: Self-pay | Admitting: Cardiology

## 2020-11-26 NOTE — Telephone Encounter (Signed)
New message     Is it ok for patient to go into a sauna?  He is joining [employer] and they want to know if he is medically ok for him to use the sauna

## 2020-11-26 NOTE — Telephone Encounter (Signed)
Per Dr. Tanna Furry last note: 1. Syncope - he is s/p ILR and has done well with no arrhythmias. He has gone over 6 months without an episode. He is not interested in having his ILR removed. 2. Obesity - he has lost weight. 3. HTN - his bp is back to normal with weight loss and medical therapy. 4. CAD - he is very active and denies anginal symptoms.   Patient denies SOB, CP, presyncope episodes.

## 2020-11-27 NOTE — Telephone Encounter (Signed)
Spoke to patient who verbalized understanding and thanks.

## 2020-11-27 NOTE — Telephone Encounter (Signed)
Yes ok for sauna   [name] MD

## 2020-11-30 ENCOUNTER — Other Ambulatory Visit: Payer: Self-pay | Admitting: Allergy & Immunology

## 2020-11-30 ENCOUNTER — Other Ambulatory Visit: Payer: Self-pay | Admitting: "Endocrinology

## 2020-11-30 ENCOUNTER — Ambulatory Visit (INDEPENDENT_AMBULATORY_CARE_PROVIDER_SITE_OTHER): Payer: Medicare Other

## 2020-11-30 DIAGNOSIS — J309 Allergic rhinitis, unspecified: Secondary | ICD-10-CM | POA: Diagnosis not present

## 2020-12-11 ENCOUNTER — Other Ambulatory Visit: Payer: Self-pay

## 2020-12-11 DIAGNOSIS — K760 Fatty (change of) liver, not elsewhere classified: Secondary | ICD-10-CM

## 2020-12-12 ENCOUNTER — Ambulatory Visit (INDEPENDENT_AMBULATORY_CARE_PROVIDER_SITE_OTHER): Payer: Medicare Other

## 2020-12-12 ENCOUNTER — Ambulatory Visit (HOSPITAL_COMMUNITY): Payer: Medicare Other

## 2020-12-12 DIAGNOSIS — J309 Allergic rhinitis, unspecified: Secondary | ICD-10-CM

## 2020-12-24 DIAGNOSIS — J301 Allergic rhinitis due to pollen: Secondary | ICD-10-CM | POA: Diagnosis not present

## 2020-12-24 NOTE — Progress Notes (Signed)
VIALS EXP 12-24-21

## 2020-12-25 DIAGNOSIS — J302 Other seasonal allergic rhinitis: Secondary | ICD-10-CM | POA: Diagnosis not present

## 2020-12-26 ENCOUNTER — Encounter (HOSPITAL_COMMUNITY): Payer: Self-pay

## 2020-12-26 NOTE — Progress Notes (Signed)
Patient's LDCT rescheduled for 05/27 at 0900. Patient aware.

## 2020-12-28 ENCOUNTER — Ambulatory Visit (INDEPENDENT_AMBULATORY_CARE_PROVIDER_SITE_OTHER): Payer: Medicare Other

## 2020-12-28 DIAGNOSIS — J309 Allergic rhinitis, unspecified: Secondary | ICD-10-CM

## 2021-01-09 ENCOUNTER — Other Ambulatory Visit: Payer: Self-pay

## 2021-01-09 ENCOUNTER — Ambulatory Visit (INDEPENDENT_AMBULATORY_CARE_PROVIDER_SITE_OTHER): Payer: Medicare Other | Admitting: Nurse Practitioner

## 2021-01-09 ENCOUNTER — Encounter: Payer: Self-pay | Admitting: Nurse Practitioner

## 2021-01-09 DIAGNOSIS — T7840XA Allergy, unspecified, initial encounter: Secondary | ICD-10-CM | POA: Diagnosis not present

## 2021-01-09 MED ORDER — PREDNISONE 10 MG PO TABS: ORAL_TABLET | ORAL | 0 refills | Status: AC

## 2021-01-09 MED ORDER — METHYLPREDNISOLONE ACETATE 80 MG/ML IJ SUSP: 40.0000 mg | Freq: Once | INTRAMUSCULAR | Status: DC

## 2021-01-09 NOTE — Assessment & Plan Note (Signed)
-  unsure of etiology; he states he has grass allergy, but was mowing grass -IM depomedrol -Rx. Prednisone 12-day dose pack

## 2021-01-09 NOTE — Progress Notes (Signed)
Acute Office Visit  Subjective:    Patient ID: Randy Hebert, male    DOB: 12-Feb-1966, 55 y.o.   MRN: [id number]  Chief Complaint  Patient presents with  . Rash    HPI Patient is in today for rash. He states he mows grass, and he is now itching all over his body. He states the rash started 1-2 weeks ago.  Past Medical History:  Diagnosis Date  . ACE inhibitor-aggravated angioedema   . Allergy   . Angio-edema   . Anxiety   . Arthritis   . Asthma    hip replacement  . Back pain   . Bradycardia 04/28/2017  . Bulging of cervical intervertebral disc   . CAD (coronary artery disease)    lateral STEMI 02/06/2015 00% D1 occlusion treated with Promus Premier 2.5 mm x 16 mm DES, 70% ramus stenosis, 40% mid RCA stenosis, 45% distal RCA stenosis, EF 45-50%  . CHF (congestive heart failure) ([place])   . COPD (chronic obstructive pulmonary disease) ([place])   . Depression   . Diabetes mellitus without complication ([place])   . 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 Dry eye   . Dyspnea   . Early satiety 09/23/2016  . GERD (gastroesophageal reflux disease)   . HCAP (healthcare-associated pneumonia) 05/15/2017  . Headache   . Heart murmur   . Hip pain   . Hyperlipidemia   . Hypertension   . Lobar pneumonia ([place]) 05/15/2017  . Melena 08/04/2018  . MI (myocardial infarction) ([place])   . Myocardial infarction ([place])   . Neck pain   . Non-ST elevation (NSTEMI) myocardial infarction ([place]) 04/27/2017  . NSTEMI (non-ST elevated myocardial infarction) ([place]) 04/26/2017  . Otitis media   . Pleurisy   . Pneumonia due to COVID-19 virus 10/07/2019  . Rectal bleeding 11/08/2018  . Right shoulder pain 03/20/2020  . Sinus pause    9 sec sinus pause on telemetry after started on coreg after MI, avoid AV nodal blocking agent  . Sleep apnea   . 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 STEMI (ST elevation myocardial infarction) ([place]) 05/31/2015  . 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 Syncope 06/29/2017  . Syncope and collapse 05/15/2017  . Transaminitis 08/04/2018  . Unilateral primary osteoarthritis, right hip 07/01/2016    Past Surgical History:  Procedure Laterality Date  . BIOPSY  10/09/2016   Procedure: BIOPSY;  Surgeon: Daneil Dolin, MD;  [facility];  Service: Endoscopy;;  . BIOPSY  11/28/2019   Procedure: BIOPSY;  Surgeon: Daneil Dolin, MD;  [facility];  Service: Endoscopy;;  . CARDIAC CATHETERIZATION N/A 02/06/2015   Procedure: Left Heart Cath and Coronary Angiography;  Surgeon: Leonie Man, MD;  Location: [facility];  Service: Cardiovascular;  Laterality: N/A;  . CARDIAC CATHETERIZATION N/A 02/06/2015   Procedure: Coronary Stent Intervention;  Surgeon: Leonie Man, MD;  Location: [facility];  Service: Cardiovascular;  Laterality: N/A;  . COLONOSCOPY WITH PROPOFOL N/A 10/09/2016   Sigmoid and descending colon diverticulosis, four 4-6 mm polyps in sigmoid, one 4 mm polyp in descending. Tubular adenomas and hyperplastic. 5 year surveillance.   . COLONOSCOPY WITH PROPOFOL N/A 11/24/2016   Sigmoid and descending colon diverticulosis, four 4-6 mm polyps in sigmoid, one 4 mm polyp in descending. Tubular adenomas and hyperplastic. 5 year surveillance.   . CORONARY ANGIOPLASTY WITH STENT  PLACEMENT  01/2015  . CORONARY STENT INTERVENTION N/A 04/27/2017   Procedure: CORONARY STENT INTERVENTION;  Surgeon: Nelva Bush, MD;  Location: [facility];  Service: Cardiovascular;  Laterality: N/A;  . ELECTROPHYSIOLOGY STUDY N/A 06/29/2017   Procedure: ELECTROPHYSIOLOGY STUDY;  Surgeon: Evans Lance, MD;  Location: [facility];  Service: Cardiovascular;  Laterality: N/A;  . ESOPHAGOGASTRODUODENOSCOPY (EGD) WITH PROPOFOL N/A 10/09/2016   Dr. Gala Romney: LA grade a esophagitis.  Barrett's esophagus, biopsy-proven.  Small hiatal hernia.  EGD February 2019  . ESOPHAGOGASTRODUODENOSCOPY (EGD) WITH  PROPOFOL N/A 11/28/2019    salmon-colored esophageal mucosa (Barrett's) small hiatal hernia, portal hypertensive gastropathy, normal duodenum, 3 year surveillance  . INCISION / DRAINAGE HAND / FINGER    . LEFT HEART CATH AND CORONARY ANGIOGRAPHY N/A 04/27/2017   Procedure: LEFT HEART CATH AND CORONARY ANGIOGRAPHY;  Surgeon: Nelva Bush, MD;  Location: [facility];  Service: Cardiovascular;  Laterality: N/A;  . LOOP RECORDER INSERTION  06/29/2017   Procedure: Loop Recorder Insertion;  Surgeon: Evans Lance, MD;  Location: [facility];  Service: Cardiovascular;;  . POLYPECTOMY  11/24/2016   Procedure: POLYPECTOMY;  Surgeon: Daneil Dolin, MD;  [facility];  Service: Endoscopy;;  descending and sigmoid  . TOTAL HIP ARTHROPLASTY Right 07/01/2016  . TOTAL HIP ARTHROPLASTY Right 07/01/2016   Procedure: RIGHT TOTAL HIP ARTHROPLASTY ANTERIOR APPROACH;  Surgeon: Mcarthur Rossetti, MD;  Location: [facility];  Service: Orthopedics;  Laterality: Right;    Family History  Problem Relation Age of Onset  . Heart attack Father   . Stroke Father   . Arthritis Father   . Heart disease Father   . Cancer Mother        ???  . Arthritis Mother   . Heart disease Brother 51       died in sleep  . Early death Brother   . Diabetes Maternal Uncle   . Alzheimer's disease Maternal Grandmother     Social History   Socioeconomic History  . Marital status: Divorced    Spouse name: alice  . Number of children: 3  . Years of education: 33  . Highest education level: Not on file  Occupational History  . Occupation: SSI  Tobacco Use  . Smoking status: Current Every Day Smoker    Packs/day: 1.00    Years: 46.00    Pack years: 46.00    Types: Cigarettes  . Smokeless tobacco: Former Systems developer    Types: Secondary school teacher  . Vaping Use: Former  Substance and Sexual Activity  . Alcohol use: Yes    Alcohol/week: 0.0 standard drinks    Comment: half a pint every other day  . Drug  use: No    Comment: history of drug use- marijuana, cocaine- quit 2014  . Sexual activity: Yes    Partners: Female    Birth control/protection: None  Other Topics Concern  . Not on file  Social History Narrative   Lives with girlfriend [name]   3 children, but 1 OD  In 2019.   Grandchildren-2      Two cats: may and princess; and a dog-foxy      Enjoy: spending time with pets, TV, yard work       Diet: eats all food groups   Caffeine: half and half coffee, some tea-half and half decaf   Water: 6-8 cups daily      Wears seat belt   Does not use  phone while driving   Smoke detectors at home      Social Determinants of Health   Financial Resource Strain: Low Risk   . Difficulty of Paying Living Expenses: Not hard at all  Food Insecurity: No Food Insecurity  . Worried About Charity fundraiser in the Last Year: Never true  . Ran Out of Food in the Last Year: Never true  Transportation Needs: No Transportation Needs  . Lack of Transportation (Medical): No  . Lack of Transportation (Non-Medical): No  Physical Activity: Insufficiently Active  . Days of Exercise per Week: 3 days  . Minutes of Exercise per Session: 20 min  Stress: No Stress Concern Present  . Feeling of Stress : Not at all  Social Connections: Moderately Integrated  . Frequency of Communication with Friends and Family: More than three times a week  . Frequency of Social Gatherings with Friends and Family: More than three times a week  . Attends Religious Services: More than 4 times per year  . Active Member of Clubs or Organizations: Yes  . Attends Archivist Meetings: More than 4 times per year  . Marital Status: Divorced  Human resources officer Violence: Not At Risk  . Fear of Current or Ex-Partner: No  . Emotionally Abused: No  . Physically Abused: No  . Sexually Abused: No    Outpatient Medications Prior to Visit  Medication Sig Dispense Refill  . acetaminophen (TYLENOL) 500 MG tablet Take 1,000  mg by mouth every 4 (four) hours as needed for mild pain or fever.     Marland Kitchen albuterol (PROVENTIL HFA) 108 (90 Base) MCG/ACT inhaler INHALE 2 PUFFS INTO THE LUNGS EVERY 6 HOURS AS NEEDED FOR SHORTNESS OF BREATH OR WHEEZING. 18 g 1  . albuterol (PROVENTIL) (2.5 MG/3ML) 0.083% nebulizer solution Take 3 mLs (2.5 mg total) by nebulization every 6 (six) hours as needed for wheezing or shortness of breath. 150 mL 1  . amLODipine (NORVASC) 10 MG tablet TAKE 1 TABLET BY MOUTH ONCE A DAY. (Patient taking differently: Take 10 mg by mouth daily.) 30 tablet 0  . ascorbic acid (VITAMIN C) 500 MG tablet Take 1 tablet (500 mg total) by mouth daily. (Patient taking differently: Take 500 mg by mouth every evening.) 30 tablet 1  . aspirin 81 MG EC tablet Take 1 tablet (81 mg total) by mouth daily. (Patient taking differently: Take 81 mg by mouth 2 (two) times daily.) 30 tablet   . atorvastatin (LIPITOR) 40 MG tablet Take 1 tablet (40 mg total) by mouth every evening. 30 tablet 6  . azelastine (ASTELIN) 0.1 % nasal spray USE 2 SPRAYS IN EACH NOSTRIL TWICE DAILY. 30 mL 0  . budesonide-formoterol (SYMBICORT) 160-4.5 MCG/ACT inhaler Inhale 2 puffs into the lungs 2 (two) times daily. 10.2 g 5  . Carboxymeth-Glycerin-Polysorb 0.5-1-0.5 % SOLN Apply 1 drop to eye 2 (two) times daily as needed (dry eyes).     . Cholecalciferol (VITAMIN D3) 125 MCG (5000 UT) CAPS Take 1 capsule (5,000 Units total) by mouth daily. (Patient taking differently: Take 5,000 Units by mouth daily in the afternoon.) 90 capsule 0  . citalopram (CELEXA) 20 MG tablet Take 20 mg by mouth daily.    . cycloSPORINE (RESTASIS) 0.05 % ophthalmic emulsion Place 1 drop into both eyes 2 (two) times daily.    . ferrous sulfate 325 (65 FE) MG tablet Take 1 tablet (325 mg total) by mouth daily with breakfast. 90 tablet 1  . furosemide (LASIX) 40  MG tablet Take 1 tablet (40 mg total) by mouth daily as needed for fluid. 30 tablet 11  . glipiZIDE (GLUCOTROL XL) 5 MG 24 hr  tablet TAKE 1 TABLET BY MOUTH DAILY WITH BREAKFAST. 90 tablet 0  . glucose blood (ACCU-CHEK AVIVA PLUS) test strip Use as instructed to check blood glucose once daily, DX E11.59 100 each 12  . magnesium gluconate (MAGONATE) 500 MG tablet Take 500 mg by mouth 2 (two) times daily.    . metFORMIN (GLUCOPHAGE) 1000 MG tablet TAKE 1 TABLET BY MOUTH TWICE DAILY WITH MEALS. 180 tablet 0  . mometasone (ELOCON) 0.1 % cream Apply 1 application topically 2 (two) times a week.     . nitroGLYCERIN (NITROSTAT) 0.4 MG SL tablet Place 1 tablet (0.4 mg total) under the tongue every 5 (five) minutes as needed for chest pain. 25 tablet 3  . olopatadine (PATANOL) 0.1 % ophthalmic solution 1 drop 2 (two) times daily.    . pantoprazole (PROTONIX) 40 MG tablet Take 1 tablet (40 mg total) by mouth 2 (two) times daily before a meal. 60 tablet 3  . prednisoLONE acetate (PRED FORTE) 1 % ophthalmic suspension Place 4 drops into both eyes 2 (two) times a week.     . Tiotropium Bromide Monohydrate (SPIRIVA RESPIMAT) 2.5 MCG/ACT AERS Inhale 1 puff into the lungs daily. 4 g 5  . chlorthalidone (HYGROTON) 25 MG tablet Take 0.5 tablets (12.5 mg total) by mouth daily. 45 tablet 1  . potassium chloride SA (KLOR-CON) 20 MEQ tablet Take 2 tablets (40 mEq total) by mouth daily. 180 tablet 1   No facility-administered medications prior to visit.    Allergies  Allergen Reactions  . Carvedilol Other (See Comments)    Sinus pause on telemetry >3 seconds. Longest one 9 sec. No AV nodal agent  . Lisinopril Anaphylaxis, Shortness Of Breath and Swelling    Angioedema, required intubation and mechanical ventilation  . Amoxicillin Nausea And Vomiting and Nausea Only    Nausea only - no allergy  Did it involve swelling of the face/tongue/throat, SOB, or low BP? No Did it involve sudden or severe rash/hives, skin peeling, or any reaction on the inside of your mouth or nose? No Did you need to seek medical attention at a hospital or  doctor's office? No When did it last happen?childhood reaction If all above answers are "NO", may proceed with cephalosporin use.     Review of Systems  Constitutional: Negative.   Respiratory: Negative.   Cardiovascular: Negative.   Skin: Positive for rash.       Objective:    Physical Exam Constitutional:      Appearance: Normal appearance. He is obese.  Cardiovascular:     Rate and Rhythm: Normal rate and regular rhythm.     Pulses: Normal pulses.     Heart sounds: Normal heart sounds.  Pulmonary:     Effort: Pulmonary effort is normal.     Breath sounds: Normal breath sounds.  Skin:    Findings: Rash present.     Comments: Scattered scabs to bilateral hand and arms; wheals to abdomen  Neurological:     Mental Status: He is alert.     BP 126/80   Pulse 90   Temp 97.7 F (36.5 C)   Resp 18   Ht 5\' 5"  (1.651 m)   Wt 193 lb (87.5 kg)   SpO2 94%   BMI 32.12 kg/m  Wt Readings from Last 3 Encounters:  01/09/21 193 lb (  87.5 kg)  11/09/20 182 lb 6.4 oz (82.7 kg)  10/24/20 178 lb (80.7 kg)    Health Maintenance Due  Topic Date Due  . COVID-19 Vaccine (2 - Moderna 3-dose series) 03/19/2020  . URINE MICROALBUMIN  11/08/2020    There are no preventive care reminders to display for this patient.   Lab Results  Component Value Date   TSH 3.61 06/21/2020   Lab Results  Component Value Date   WBC 6.0 10/24/2020   HGB 11.8 (L) 10/24/2020   HCT 39.1 10/24/2020   MCV 72 (L) 10/24/2020   PLT 296 10/24/2020   Lab Results  Component Value Date   NA 134 10/24/2020   K 4.1 10/24/2020   CO2 23 10/24/2020   GLUCOSE 131 (H) 10/24/2020   BUN 7 10/24/2020   CREATININE 0.64 (L) 10/24/2020   BILITOT 1.0 10/24/2020   ALKPHOS 164 (H) 10/24/2020   AST 28 10/24/2020   ALT 20 10/24/2020   PROT 7.3 10/24/2020   ALBUMIN 4.2 10/24/2020   CALCIUM 9.7 10/24/2020   ANIONGAP 8 10/11/2019   Lab Results  Component Value Date   CHOL 135 10/24/2020   Lab Results   Component Value Date   HDL 34 (L) 10/24/2020   Lab Results  Component Value Date   LDLCALC 83 10/24/2020   Lab Results  Component Value Date   TRIG 94 10/24/2020   Lab Results  Component Value Date   CHOLHDL 7.1 (H) 06/21/2020   Lab Results  Component Value Date   HGBA1C 6.1 10/17/2020       Assessment & Plan:   Problem List Items Addressed This Visit      Other   Allergic reaction    -unsure of etiology; he states he has grass allergy, but was mowing grass -IM depomedrol -Rx. Prednisone 12-day dose pack      Relevant Medications   predniSONE (DELTASONE) 10 MG tablet       Meds ordered this encounter  Medications  . predniSONE (DELTASONE) 10 MG tablet    Sig: Take 6 tablets (60 mg total) by mouth daily with breakfast for 2 days, THEN 5 tablets (50 mg total) daily with breakfast for 2 days, THEN 4 tablets (40 mg total) daily with breakfast for 2 days, THEN 3 tablets (30 mg total) daily with breakfast for 2 days, THEN 2 tablets (20 mg total) daily with breakfast for 2 days, THEN 1 tablet (10 mg total) daily with breakfast for 2 days.    Dispense:  42 tablet    Refill:  0     Noreene Larsson, NP

## 2021-01-09 NOTE — Addendum Note (Signed)
Addended by: Lonn Georgia on: 01/09/2021 08:54 AM   Modules accepted: Orders

## 2021-01-11 ENCOUNTER — Other Ambulatory Visit: Payer: Self-pay

## 2021-01-11 ENCOUNTER — Encounter: Payer: Self-pay | Admitting: Allergy & Immunology

## 2021-01-11 ENCOUNTER — Ambulatory Visit (INDEPENDENT_AMBULATORY_CARE_PROVIDER_SITE_OTHER): Payer: Medicare Other | Admitting: Allergy & Immunology

## 2021-01-11 VITALS — BP 140/82 | HR 92 | Temp 98.3°F | Resp 16 | Ht 65.0 in | Wt 194.4 lb

## 2021-01-11 DIAGNOSIS — J302 Other seasonal allergic rhinitis: Secondary | ICD-10-CM | POA: Diagnosis not present

## 2021-01-11 DIAGNOSIS — R21 Rash and other nonspecific skin eruption: Secondary | ICD-10-CM

## 2021-01-11 DIAGNOSIS — J449 Chronic obstructive pulmonary disease, unspecified: Secondary | ICD-10-CM

## 2021-01-11 DIAGNOSIS — J3089 Other allergic rhinitis: Secondary | ICD-10-CM

## 2021-01-11 DIAGNOSIS — J4489 Other specified chronic obstructive pulmonary disease: Secondary | ICD-10-CM

## 2021-01-11 MED ORDER — TRIAMCINOLONE ACETONIDE 0.1 % EX OINT: 1.0000 "application " | TOPICAL_OINTMENT | Freq: Two times a day (BID) | CUTANEOUS | 1 refills | Status: DC

## 2021-01-11 MED ORDER — METHYLPREDNISOLONE ACETATE 80 MG/ML IJ SUSP
80.0000 mg | Freq: Once | INTRAMUSCULAR | Status: AC
  Administered 2021-01-11: 80 mg via INTRAMUSCULAR

## 2021-01-11 MED ORDER — EPINEPHRINE 0.3 MG/0.3ML IJ SOAJ: 0.3000 mg | Freq: Once | INTRAMUSCULAR | 1 refills | Status: AC

## 2021-01-11 NOTE — Patient Instructions (Addendum)
1. Asthma-COPD overlap syndrome - Lung testing looks fairly stable today.  - We are not going to make any changes since you are doing fairly well. - Daily controller medication(s): Spiriva 2.54mcg one inhalation once daily and Symbicort 160/4.71mcg two puffs twice daily with spacer - Prior to physical activity: Ventolin 2 puffs 10-15 minutes before physical activity. - Rescue medications: Ventolin 4 puffs every 4-6 hours as needed - Asthma control goals:  * Full participation in all desired activities (may need albuterol before activity) * Albuterol use two time or less a week on average (not counting use with activity) * Cough interfering with sleep two time or less a month * Oral steroids no more than once a year * No hospitalizations  2. Chronic rhinitis (grasses, ragweed, weeds, indoor molds, outdoor molds, cat, dog and cockroach) - Continue with the shots at the current schedule. - Continue with: Zyrtec (cetirizine) 10mg  tablet once daily and Astelin (azelastine) 2 sprays per nostril 1-2 times daily as needed  3. Generalized rash - DepoMedrol 80mg  given today. - Finish the prednisone course. - Double up the Zyrtec (cetirizine) to 10mg  THREE TIMES DAILY to control the itching. - Add on triamcinolone mixed with Eucerin to the entire body twice daily for two weeks, once daily for two weeks, then STOP.   4. Follow up in one month or sooner if needed.    Please inform us of any Emergency Department visits, hospitalizations, or changes in symptoms. Call us before going to the ED for breathing or allergy symptoms since we might be able to fit you in for a sick visit. Feel free to contact us anytime with any questions, problems, or concerns.  It was a pleasure to see you again today!  Websites that have reliable patient information: 1. American Academy of Asthma, Allergy, and Immunology: www.aaaai.org 2. Food Allergy Research and Education (FARE): foodallergy.org 3. Mothers of Asthmatics:  http://www.asthmacommunitynetwork.org 4. American College of Allergy, Asthma, and Immunology: www.acaai.org   COVID-19 Vaccine Information can be found at: ShippingScam.co.uk For questions related to vaccine distribution or appointments, please email [email or web address] or call [phone].   We realize that you might be concerned about having an allergic reaction to the COVID19 vaccines. To help with that concern, WE ARE OFFERING THE COVID19 VACCINES IN OUR OFFICE! Ask the front desk for dates!     "Like" [place] on Facebook and Instagram for our latest updates!      A healthy democracy works best when [employer] participate! Make sure you are registered to vote! If you have moved or changed any of your contact information, you will need to get this updated before voting!  In some cases, you MAY be able to register to vote online: [email or web address]

## 2021-01-11 NOTE — Progress Notes (Signed)
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 Asthma-COPD overlap syndrome - Lung testing looks fairly stable today.  - We are not going to make any changes since you are doing fairly well. - Daily controller medication(s): Spiriva 2.71mcg one inhalation once daily and Symbicort 160/4.69mcg two puffs twice daily with spacer - Prior to physical activity: Ventolin 2 puffs 10-15 minutes before physical activity. - Rescue medications: Ventolin 4 puffs every 4-6 hours as needed - Asthma control goals:  * Full participation in all desired activities (may need albuterol before activity) * Albuterol use two time or less a week on average (not counting use with activity) * Cough interfering with sleep two time or less a month * Oral steroids no more than once a year * No hospitalizations  2. Chronic rhinitis (grasses, ragweed, weeds, indoor molds, outdoor molds, cat, dog and cockroach) - Continue with the shots at the current schedule. - Continue with: Zyrtec (cetirizine) 10mg  tablet once daily and Astelin (azelastine) 2 sprays per nostril 1-2 times daily as needed  3. Generalized rash - DepoMedrol 80mg  given today. - Finish the prednisone course. - Double up the Zyrtec (cetirizine) to 10mg  THREE TIMES DAILY to control the itching. - Add on triamcinolone mixed with Eucerin to the entire body twice daily for two weeks, once daily for two weeks, then STOP.   4. Follow up in one month or sooner if needed.    Subjective:   Randy Hebert is a 55 y.o. male presenting today for follow up of  Chief Complaint  Patient presents with  . Asthma-COPD    Randy Hebert has a history of the following: Patient  Active Problem List   Diagnosis Date Noted  . Allergic reaction 01/09/2021  . Need for immunization against influenza 06/20/2020  . Annual visit for general adult medical examination with abnormal findings 06/20/2020  . Encounter for screening for malignant neoplasm of prostate 06/20/2020  . Seasonal and perennial allergic rhinitis 11/10/2018  . Restrictive lung disease 11/10/2018  . Fatty liver 11/08/2018  . Barrett's esophagus 08/04/2018  . Vitamin D deficiency 06/30/2018  . Alcohol abuse 06/30/2018  . 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 Chronic combined systolic and diastolic CHF (congestive heart failure) ([place]) 05/15/2017  . Sleep apnea 04/28/2017  . Depression with anxiety 04/15/2017  . GERD (gastroesophageal reflux disease) 09/23/2016  . Asthma-COPD overlap syndrome ([place]) 08/27/2015  . Cigarette nicotine dependence with nicotine-induced disorder 08/27/2015  . Mixed hyperlipidemia 08/27/2015  . Class 1 obesity due to excess calories without serious comorbidity with body mass index (BMI) of 34.0 to 34.9 in adult 08/27/2015  . Chronic systolic CHF (congestive heart failure) ([place]) 06/01/2015  . CAD (coronary artery disease) 05/31/2015  . Essential hypertension, benign 02/09/2015    History obtained from: chart review and patient.  Randy Hebert is a 56 y.o. male presenting for a sick visit.  He was last seen in March 2022 by Webb Silversmith, one of our nurse practitioners.  At that time, he was continued on Symbicort as well as Spiriva.  His allergic rhinitis was under good control with his allergen immunotherapy.  He was continued on cetirizine as well as Astelin.  Smoking cessation was discussed which is done  at every single visit.   In the interim, he has developed a rash over his entire body. He has been working out more often. When he goes to work out, he has decided to get a three in one soap (soap, shampoo, conditioner). He thinks that this is a [employer] brand. This is the  only change that he made. This has been going on since Monday or so. This started on his arms as well as his abdomen   He has since developed erythema over his entire body. He was mowing grass as well and mowed over some poison ivy/oak. He has never reacted to that in the past. He typically does fine with this.   He does not have any type of steroid ointment to put on it. He does have some topical Benadryl which does work for a bit of time. He started prednisone already after getting a shot two days ago. He has been on steroids (prednisone) for 48 hours already.  He is on a taper that is scheduled to last well over a week.  Otherwise, there have been no changes to his past medical history, surgical history, family history, or social history.    Review of Systems  Constitutional: Negative.  Negative for chills, fever, malaise/fatigue and weight loss.  HENT: Positive for congestion. Negative for ear discharge, ear pain and sinus pain.   Eyes: Negative for pain, discharge and redness.  Respiratory: Positive for cough. Negative for sputum production, shortness of breath and wheezing.   Cardiovascular: Negative.  Negative for chest pain and palpitations.  Gastrointestinal: Negative for abdominal pain, constipation, diarrhea, heartburn, nausea and vomiting.  Skin: Positive for itching and rash.  Neurological: Negative for dizziness and headaches.  Endo/Heme/Allergies: Positive for environmental allergies. Does not bruise/bleed easily.       Objective:   Blood pressure 140/82, pulse 92, temperature 98.3 F (36.8 C), temperature source Temporal, resp. rate 16, height 5\' 5"  (1.651 m), weight 194 lb 6.4 oz (88.2 kg), SpO2 97 %. Body mass index is 32.35 kg/m.   Physical Exam:  Physical Exam Constitutional:      Appearance: He is well-developed.     Comments: Talkative.  HENT:     Head: Normocephalic and atraumatic.     Right Ear: Tympanic membrane, ear canal and external ear normal.      Left Ear: Tympanic membrane, ear canal and external ear normal.     Nose: No nasal deformity, septal deviation, mucosal edema or rhinorrhea.     Right Turbinates: Enlarged and swollen.     Left Turbinates: Enlarged and swollen.     Right Sinus: No maxillary sinus tenderness or frontal sinus tenderness.     Left Sinus: No maxillary sinus tenderness or frontal sinus tenderness.     Mouth/Throat:     Mouth: Mucous membranes are not pale and not dry.     Pharynx: Uvula midline.  Eyes:     General:        Right eye: No discharge.        Left eye: No discharge.     Conjunctiva/sclera: Conjunctivae normal.     Right eye: Right conjunctiva is not injected. No chemosis.    Left eye: Left conjunctiva is not injected. No chemosis.    Pupils: Pupils are equal, round, and reactive to light.  Cardiovascular:     Rate and Rhythm: Normal rate and regular rhythm.     Heart sounds: Normal heart sounds.  Pulmonary:  Effort: Pulmonary effort is normal. No tachypnea, accessory muscle usage or respiratory distress.     Breath sounds: Normal breath sounds. No wheezing, rhonchi or rales.  Chest:     Chest wall: No tenderness.  Lymphadenopathy:     Cervical: No cervical adenopathy.  Skin:    General: Skin is warm.     Capillary Refill: Capillary refill takes less than 2 seconds.     Coloration: Skin is not pale.     Findings: No abrasion, erythema, petechiae or rash. Rash is not papular, urticarial or vesicular.     Comments: Multiple linear raised lesions over the bilateral arms.  He also has these on his torso.  There are no vesicles from what I can see.  There are excoriations.  There is no honey crusting or discharge.  Neurological:     Mental Status: He is alert.      Diagnostic studies:    Spirometry: results abnormal (FEV1: 1.77/56%, FVC: 2.09/52%, FEV1/FVC: 85%).    Spirometry consistent with possible restrictive disease.   Allergy Studies: none       Salvatore Marvel, MD  [facility]

## 2021-01-14 ENCOUNTER — Encounter: Payer: Self-pay | Admitting: Allergy & Immunology

## 2021-01-16 ENCOUNTER — Ambulatory Visit: Payer: Medicare Other | Admitting: Nurse Practitioner

## 2021-01-16 ENCOUNTER — Ambulatory Visit (INDEPENDENT_AMBULATORY_CARE_PROVIDER_SITE_OTHER): Payer: Medicare Other

## 2021-01-16 DIAGNOSIS — J309 Allergic rhinitis, unspecified: Secondary | ICD-10-CM

## 2021-01-25 ENCOUNTER — Ambulatory Visit (HOSPITAL_COMMUNITY)
Admission: RE | Admit: 2021-01-25 | Discharge: 2021-01-25 | Disposition: A | Discharge status: Home / Self Care | Payer: Medicare Other | Source: Ambulatory Visit | Attending: Oncology | Admitting: Oncology

## 2021-01-25 ENCOUNTER — Other Ambulatory Visit: Payer: Self-pay

## 2021-01-25 ENCOUNTER — Encounter: Payer: Self-pay | Admitting: Gastroenterology

## 2021-01-25 DIAGNOSIS — Z87891 Personal history of nicotine dependence: Secondary | ICD-10-CM | POA: Insufficient documentation

## 2021-01-25 DIAGNOSIS — Z122 Encounter for screening for malignant neoplasm of respiratory organs: Secondary | ICD-10-CM | POA: Diagnosis not present

## 2021-01-25 DIAGNOSIS — F17210 Nicotine dependence, cigarettes, uncomplicated: Secondary | ICD-10-CM | POA: Diagnosis not present

## 2021-01-29 ENCOUNTER — Encounter (HOSPITAL_COMMUNITY): Payer: Self-pay

## 2021-01-29 ENCOUNTER — Telehealth: Payer: Self-pay

## 2021-01-29 ENCOUNTER — Other Ambulatory Visit: Payer: Self-pay | Admitting: Family Medicine

## 2021-01-29 NOTE — Progress Notes (Signed)
Patient notified of LDCT Lung Cancer Screening Results via mail with the recommendation to follow-up in 6 months. I attempted to reach the patient via telephone but was unable to do so. Patient's referring provider has been sent a copy of results. Results are as follows:  IMPRESSION: 1. Lung-RADS 3S, probably benign findings. Short-term follow-up in 6 months is recommended with repeat low-dose chest CT without contrast (please use the following order, "CT CHEST LCS NODULE FOLLOW-UP W/O CM"). 2. The "S" modifier above refers to potentially clinically significant non lung cancer related findings. Specifically, there is aortic atherosclerosis, in addition to left main and 3 vessel coronary artery disease. Please note that although the presence of coronary artery calcium documents the presence of coronary artery disease, the severity of this disease and any potential stenosis cannot be assessed on this non-gated CT examination. Assessment for potential risk factor modification, dietary therapy or pharmacologic therapy may be warranted, if clinically indicated. 3. Mild diffuse bronchial wall thickening with mild centrilobular and paraseptal emphysema; imaging findings suggestive of underlying COPD. 4. There are calcifications of the aortic valve. Echocardiographic correlation for evaluation of potential valvular dysfunction may be warranted if clinically indicated. 5. Right-sided aortic arch (normal anatomical variant). 6. Cirrhosis.

## 2021-01-29 NOTE — Telephone Encounter (Signed)
Pt needs a refill of Amlodipine

## 2021-01-29 NOTE — Telephone Encounter (Signed)
Rx sent in

## 2021-01-31 ENCOUNTER — Ambulatory Visit: Payer: Medicare Other | Admitting: "Endocrinology

## 2021-02-06 ENCOUNTER — Ambulatory Visit (INDEPENDENT_AMBULATORY_CARE_PROVIDER_SITE_OTHER): Payer: Medicare Other

## 2021-02-06 DIAGNOSIS — J309 Allergic rhinitis, unspecified: Secondary | ICD-10-CM | POA: Diagnosis not present

## 2021-02-15 ENCOUNTER — Ambulatory Visit: Payer: Medicare Other | Admitting: Family Medicine

## 2021-03-08 ENCOUNTER — Ambulatory Visit (INDEPENDENT_AMBULATORY_CARE_PROVIDER_SITE_OTHER): Payer: Medicare Other

## 2021-03-08 DIAGNOSIS — J309 Allergic rhinitis, unspecified: Secondary | ICD-10-CM | POA: Diagnosis not present

## 2021-03-13 ENCOUNTER — Ambulatory Visit (INDEPENDENT_AMBULATORY_CARE_PROVIDER_SITE_OTHER): Payer: Medicare Other

## 2021-03-13 DIAGNOSIS — J309 Allergic rhinitis, unspecified: Secondary | ICD-10-CM

## 2021-03-25 ENCOUNTER — Ambulatory Visit: Payer: Medicare Other

## 2021-03-25 ENCOUNTER — Other Ambulatory Visit: Payer: Self-pay

## 2021-03-25 DIAGNOSIS — Z20822 Contact with and (suspected) exposure to covid-19: Secondary | ICD-10-CM | POA: Diagnosis not present

## 2021-03-27 ENCOUNTER — Telehealth: Payer: Self-pay | Admitting: Nurse Practitioner

## 2021-03-27 LAB — SARS-COV-2, NAA 2 DAY TAT

## 2021-03-27 LAB — NOVEL CORONAVIRUS, NAA: SARS-CoV-2, NAA: NOT DETECTED

## 2021-03-27 NOTE — Telephone Encounter (Signed)
PT lvm wanted a call regarding covid test

## 2021-03-27 NOTE — Progress Notes (Signed)
Good news... COVID negative.

## 2021-03-27 NOTE — Telephone Encounter (Signed)
Pt informed

## 2021-04-01 ENCOUNTER — Telehealth: Payer: Self-pay | Admitting: Internal Medicine

## 2021-04-01 NOTE — Telephone Encounter (Signed)
RECALL FOR ULTRASOUND 

## 2021-04-01 NOTE — Telephone Encounter (Signed)
Recall mailed 

## 2021-04-02 ENCOUNTER — Ambulatory Visit (INDEPENDENT_AMBULATORY_CARE_PROVIDER_SITE_OTHER): Payer: Medicare Other | Admitting: Nurse Practitioner

## 2021-04-02 ENCOUNTER — Other Ambulatory Visit: Payer: Self-pay

## 2021-04-02 ENCOUNTER — Ambulatory Visit: Payer: Medicare Other | Admitting: Family Medicine

## 2021-04-02 ENCOUNTER — Encounter: Payer: Self-pay | Admitting: Nurse Practitioner

## 2021-04-02 DIAGNOSIS — D509 Iron deficiency anemia, unspecified: Secondary | ICD-10-CM

## 2021-04-02 DIAGNOSIS — K7030 Alcoholic cirrhosis of liver without ascites: Secondary | ICD-10-CM

## 2021-04-02 DIAGNOSIS — D508 Other iron deficiency anemias: Secondary | ICD-10-CM

## 2021-04-02 DIAGNOSIS — D500 Iron deficiency anemia secondary to blood loss (chronic): Secondary | ICD-10-CM | POA: Insufficient documentation

## 2021-04-02 DIAGNOSIS — I10 Essential (primary) hypertension: Secondary | ICD-10-CM | POA: Diagnosis not present

## 2021-04-02 DIAGNOSIS — E782 Mixed hyperlipidemia: Secondary | ICD-10-CM | POA: Diagnosis not present

## 2021-04-02 DIAGNOSIS — E1159 Type 2 diabetes mellitus with other circulatory complications: Secondary | ICD-10-CM | POA: Diagnosis not present

## 2021-04-02 MED ORDER — FERROUS SULFATE 325 (65 FE) MG PO TABS: 325.0000 mg | ORAL_TABLET | Freq: Every day | ORAL | 1 refills | Status: DC

## 2021-04-02 NOTE — Assessment & Plan Note (Signed)
BP Readings from Last 3 Encounters:  04/02/21 121/71  01/11/21 140/82  01/09/21 126/80   -BP well controlled; no changes to antihypertensives

## 2021-04-02 NOTE — Patient Instructions (Signed)
Please have fasting labs drawn this week. 

## 2021-04-02 NOTE — Assessment & Plan Note (Signed)
-  on statin -checking labs today

## 2021-04-02 NOTE — Assessment & Plan Note (Signed)
-  hx of alcohol abuse -had Feb 2022 U/S that was suspicious for cirrhosis -he states he has cut out liquor, but may have one beer every now and then after hard work on a yard -has f/u with Marylee Floras; recommended repeat U/S around end of August, and he will see GI around that time

## 2021-04-02 NOTE — Progress Notes (Signed)
Acute Office Visit  Subjective:    Patient ID: Randy Hebert, male    DOB: 1966-02-12, 55 y.o.   MRN: [id number]  Chief Complaint  Patient presents with   Diabetes    Follow up    Diabetes  Patient is in today for lab follow-up.  He is now looking for an apartment. He states his family turned him in and he lost his home d/t improper permits on his dwelling.   He states he hasn't checked his blood sugar lately.  Past Medical History:  Diagnosis Date   ACE inhibitor-aggravated angioedema    Allergy    Angio-edema    Anxiety    Arthritis    Asthma    hip replacement   Back pain    Bradycardia 04/28/2017   Bulging of cervical intervertebral disc    CAD (coronary artery disease)    lateral STEMI 02/06/2015 00% D1 occlusion treated with Promus Premier 2.5 mm x 16 mm DES, 70% ramus stenosis, 40% mid RCA stenosis, 45% distal RCA stenosis, EF 45-50%   CHF (congestive heart failure) (HCC)    COPD (chronic obstructive pulmonary disease) ([place])    Depression    Diabetes mellitus without complication ([place])    Difficult intubation    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   Dry eye    Dyspnea    Early satiety 09/23/2016   GERD (gastroesophageal reflux disease)    HCAP (healthcare-associated pneumonia) 05/15/2017   Headache    Heart murmur    Hip pain    Hyperlipidemia    Hypertension    Lobar pneumonia ([place]) 05/15/2017   Melena 08/04/2018   MI (myocardial infarction) ([place])    Myocardial infarction ([place])    Neck pain    Non-ST elevation (NSTEMI) myocardial infarction ([place]) 04/27/2017   NSTEMI (non-ST elevated myocardial infarction) ([place]) 04/26/2017   Otitis media    Pleurisy    Pneumonia due to COVID-19 virus 10/07/2019   Rectal bleeding 11/08/2018   Right shoulder pain 03/20/2020   Sinus pause    9 sec sinus pause on telemetry after started on coreg after MI, avoid AV nodal blocking agent   Sleep apnea    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    STEMI (ST elevation myocardial infarction) ([place]) 05/31/2015   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   Syncope 66/44/0347   Syncope and collapse 05/15/2017   Transaminitis 08/04/2018   Unilateral primary osteoarthritis, right hip 07/01/2016    Past Surgical History:  Procedure Laterality Date   BIOPSY  10/09/2016   Procedure: BIOPSY;  Surgeon: Daneil Dolin, MD;  [facility];  Service: Endoscopy;;   BIOPSY  11/28/2019   Procedure: BIOPSY;  Surgeon: Daneil Dolin, MD;  [facility];  Service: Endoscopy;;   CARDIAC CATHETERIZATION N/A 02/06/2015   Procedure: Left Heart Cath and Coronary Angiography;  Surgeon: Leonie Man, MD;  Location: [facility];  Service: Cardiovascular;  Laterality: N/A;   CARDIAC CATHETERIZATION N/A 02/06/2015   Procedure: Coronary Stent Intervention;  Surgeon: Leonie Man, MD;  Location: [facility];  Service: Cardiovascular;  Laterality: N/A;   COLONOSCOPY WITH PROPOFOL N/A 10/09/2016   Sigmoid and descending colon diverticulosis, four 4-6 mm polyps in sigmoid, one 4 mm polyp in descending. Tubular adenomas and hyperplastic. 5 year surveillance.    COLONOSCOPY WITH PROPOFOL N/A 11/24/2016   Sigmoid and descending colon diverticulosis, four 4-6 mm polyps  in sigmoid, one 4 mm polyp in descending. Tubular adenomas and hyperplastic. 5 year surveillance.    CORONARY ANGIOPLASTY WITH STENT PLACEMENT  01/2015   CORONARY STENT INTERVENTION N/A 04/27/2017   Procedure: CORONARY STENT INTERVENTION;  Surgeon: Nelva Bush, MD;  Location: [facility];  Service: Cardiovascular;  Laterality: N/A;   ELECTROPHYSIOLOGY STUDY N/A 06/29/2017   Procedure: ELECTROPHYSIOLOGY STUDY;  Surgeon: Evans Lance, MD;  Location: [facility];  Service: Cardiovascular;  Laterality: N/A;   ESOPHAGOGASTRODUODENOSCOPY (EGD) WITH PROPOFOL N/A 10/09/2016   Dr. Gala Romney: LA grade a esophagitis.  Barrett's esophagus, biopsy-proven.  Small hiatal hernia.  EGD  February 2019   ESOPHAGOGASTRODUODENOSCOPY (EGD) WITH PROPOFOL N/A 11/28/2019    salmon-colored esophageal mucosa (Barrett's) small hiatal hernia, portal hypertensive gastropathy, normal duodenum, 3 year surveillance   INCISION / DRAINAGE HAND / FINGER     LEFT HEART CATH AND CORONARY ANGIOGRAPHY N/A 04/27/2017   Procedure: LEFT HEART CATH AND CORONARY ANGIOGRAPHY;  Surgeon: Nelva Bush, MD;  Location: [facility];  Service: Cardiovascular;  Laterality: N/A;   LOOP RECORDER INSERTION  06/29/2017   Procedure: Loop Recorder Insertion;  Surgeon: Evans Lance, MD;  Location: [facility];  Service: Cardiovascular;;   POLYPECTOMY  11/24/2016   Procedure: POLYPECTOMY;  Surgeon: Daneil Dolin, MD;  [facility];  Service: Endoscopy;;  descending and sigmoid   TOTAL HIP ARTHROPLASTY Right 07/01/2016   TOTAL HIP ARTHROPLASTY Right 07/01/2016   Procedure: RIGHT TOTAL HIP ARTHROPLASTY ANTERIOR APPROACH;  Surgeon: Mcarthur Rossetti, MD;  Location: [facility];  Service: Orthopedics;  Laterality: Right;    Family History  Problem Relation Age of Onset   Heart attack Father    Stroke Father    Arthritis Father    Heart disease Father    Cancer Mother        ???   Arthritis Mother    Heart disease Brother 40       died in sleep   Early death Brother    Diabetes Maternal Uncle    Alzheimer's disease Maternal Grandmother     Social History   Socioeconomic History   Marital status: Divorced    Spouse name: alice   Number of children: 3   Years of education: 12   Highest education level: Not on file  Occupational History   Occupation: SSI  Tobacco Use   Smoking status: Every Day    Packs/day: 1.00    Years: 46.00    Pack years: 46.00    Types: Cigarettes   Smokeless tobacco: Former    Types: Nurse, children's Use: Former  Substance and Sexual Activity   Alcohol use: Yes    Alcohol/week: 0.0 standard drinks    Comment: half a pint every other day    Drug use: No    Comment: history of drug use- marijuana, cocaine- quit 2014   Sexual activity: Yes    Partners: Female    Birth control/protection: None  Other Topics Concern   Not on file  Social History Narrative   Lives with girlfriend [name]   3 children, but 1 OD  In 2019.   Grandchildren-2      Two cats: may and princess; and a dog-foxy      Enjoy: spending time with pets, TV, yard work       Diet: eats all food groups   Caffeine: half and half coffee, some tea-half and half decaf  Water: 6-8 cups daily      Wears seat belt   Does not use phone while driving   Oceanographer at home      Social Determinants of Health   Financial Resource Strain: Low Risk    Difficulty of Paying Living Expenses: Not hard at all  Food Insecurity: No Food Insecurity   Worried About Charity fundraiser in the Last Year: Never true   Arboriculturist in the Last Year: Never true  Transportation Needs: No Transportation Needs   Lack of Transportation (Medical): No   Lack of Transportation (Non-Medical): No  Physical Activity: Insufficiently Active   Days of Exercise per Week: 3 days   Minutes of Exercise per Session: 20 min  Stress: No Stress Concern Present   Feeling of Stress : Not at all  Social Connections: Moderately Integrated   Frequency of Communication with Friends and Family: More than three times a week   Frequency of Social Gatherings with Friends and Family: More than three times a week   Attends Religious Services: More than 4 times per year   Active Member of [employer] or Organizations: Yes   Attends Music therapist: More than 4 times per year   Marital Status: Divorced  Human resources officer Violence: Not At Risk   Fear of Current or Ex-Partner: No   Emotionally Abused: No   Physically Abused: No   Sexually Abused: No    Outpatient Medications Prior to Visit  Medication Sig Dispense Refill   acetaminophen (TYLENOL) 500 MG tablet Take 1,000 mg by mouth  every 4 (four) hours as needed for mild pain or fever.      albuterol (PROVENTIL HFA) 108 (90 Base) MCG/ACT inhaler INHALE 2 PUFFS INTO THE LUNGS EVERY 6 HOURS AS NEEDED FOR SHORTNESS OF BREATH OR WHEEZING. 18 g 1   albuterol (PROVENTIL) (2.5 MG/3ML) 0.083% nebulizer solution Take 3 mLs (2.5 mg total) by nebulization every 6 (six) hours as needed for wheezing or shortness of breath. 150 mL 1   amLODipine (NORVASC) 10 MG tablet TAKE 1 TABLET BY MOUTH ONCE A DAY. 90 tablet 0   ascorbic acid (VITAMIN C) 500 MG tablet Take 1 tablet (500 mg total) by mouth daily. (Patient taking differently: Take 500 mg by mouth every evening.) 30 tablet 1   aspirin 81 MG EC tablet Take 1 tablet (81 mg total) by mouth daily. (Patient taking differently: Take 81 mg by mouth 2 (two) times daily.) 30 tablet    atorvastatin (LIPITOR) 40 MG tablet Take 1 tablet (40 mg total) by mouth every evening. 30 tablet 6   azelastine (ASTELIN) 0.1 % nasal spray USE 2 SPRAYS IN EACH NOSTRIL TWICE DAILY. 30 mL 0   budesonide-formoterol (SYMBICORT) 160-4.5 MCG/ACT inhaler Inhale 2 puffs into the lungs 2 (two) times daily. 10.2 g 5   Cholecalciferol (VITAMIN D3) 125 MCG (5000 UT) CAPS Take 1 capsule (5,000 Units total) by mouth daily. (Patient taking differently: Take 5,000 Units by mouth daily in the afternoon.) 90 capsule 0   citalopram (CELEXA) 20 MG tablet Take 20 mg by mouth daily.     cycloSPORINE (RESTASIS) 0.05 % ophthalmic emulsion Place 1 drop into both eyes 2 (two) times daily.     furosemide (LASIX) 40 MG tablet Take 1 tablet (40 mg total) by mouth daily as needed for fluid. 30 tablet 11   glipiZIDE (GLUCOTROL XL) 5 MG 24 hr tablet TAKE 1 TABLET BY MOUTH DAILY WITH BREAKFAST. [place]  tablet 0   glucose blood (ACCU-CHEK AVIVA PLUS) test strip Use as instructed to check blood glucose once daily, DX E11.59 100 each 12   magnesium gluconate (MAGONATE) 500 MG tablet Take 500 mg by mouth 2 (two) times daily.     metFORMIN (GLUCOPHAGE) 1000  MG tablet TAKE 1 TABLET BY MOUTH TWICE DAILY WITH MEALS. 180 tablet 0   mometasone (ELOCON) 0.1 % cream Apply 1 application topically 2 (two) times a week.      nitroGLYCERIN (NITROSTAT) 0.4 MG SL tablet Place 1 tablet (0.4 mg total) under the tongue every 5 (five) minutes as needed for chest pain. 25 tablet 3   olopatadine (PATANOL) 0.1 % ophthalmic solution 1 drop 2 (two) times daily.     pantoprazole (PROTONIX) 40 MG tablet Take 1 tablet (40 mg total) by mouth 2 (two) times daily before a meal. 60 tablet 3   prednisoLONE acetate (PRED FORTE) 1 % ophthalmic suspension Place 4 drops into both eyes 2 (two) times a week.      Tiotropium Bromide Monohydrate (SPIRIVA RESPIMAT) 2.5 MCG/ACT AERS Inhale 1 puff into the lungs daily. 4 g 5   triamcinolone ointment (KENALOG) 0.1 % Apply 1 application topically 2 (two) times daily. 454 g 1   Carboxymeth-Glycerin-Polysorb 0.5-1-0.5 % SOLN Apply 1 drop to eye 2 (two) times daily as needed (dry eyes).      ferrous sulfate 325 (65 FE) MG tablet Take 1 tablet (325 mg total) by mouth daily with breakfast. 90 tablet 1   chlorthalidone (HYGROTON) 25 MG tablet Take 0.5 tablets (12.5 mg total) by mouth daily. 45 tablet 1   potassium chloride SA (KLOR-CON) 20 MEQ tablet Take 2 tablets (40 mEq total) by mouth daily. 180 tablet 1   methylPREDNISolone acetate (DEPO-MEDROL) injection 40 mg      No facility-administered medications prior to visit.    Allergies  Allergen Reactions   Carvedilol Other (See Comments)    Sinus pause on telemetry >3 seconds. Longest one 9 sec. No AV nodal agent   Lisinopril Anaphylaxis, Shortness Of Breath and Swelling    Angioedema, required intubation and mechanical ventilation   Amoxicillin Nausea And Vomiting and Nausea Only    Nausea only - no allergy  Did it involve swelling of the face/tongue/throat, SOB, or low BP? No Did it involve sudden or severe rash/hives, skin peeling, or any reaction on the inside of your mouth or nose?  No Did you need to seek medical attention at a hospital or doctor's office? No When did it last happen? childhood reaction      If all above answers are "NO", may proceed with cephalosporin use.     Review of Systems  Constitutional: Negative.   Respiratory: Negative.    Cardiovascular: Negative.   Gastrointestinal:        Recently diagnosed with cirrhosis of liver  Psychiatric/Behavioral: Negative.        Objective:    Physical Exam Constitutional:      Appearance: Normal appearance. He is obese.  Cardiovascular:     Rate and Rhythm: Normal rate and regular rhythm.     Pulses: Normal pulses.     Heart sounds: Normal heart sounds.  Pulmonary:     Effort: Pulmonary effort is normal.     Breath sounds: Normal breath sounds.  Abdominal:     General: There is distension.     Palpations: There is no mass.     Tenderness: There is no abdominal tenderness. There is  no guarding or rebound.     Hernia: No hernia is present.  Musculoskeletal:        General: Normal range of motion.  Neurological:     Mental Status: He is alert.  Psychiatric:        Mood and Affect: Mood normal.        Behavior: Behavior normal.        Thought Content: Thought content normal.        Judgment: Judgment normal.    BP 121/71 (BP Location: Left Arm, Patient Position: Sitting, Cuff Size: Large)   Pulse 87   Temp (!) 97.4 F (36.3 C) (Temporal)   Ht $R'5\' 5"'qD$  (1.651 m)   Wt 206 lb (93.4 kg)   SpO2 92%   BMI 34.28 kg/m  Wt Readings from Last 3 Encounters:  04/02/21 206 lb (93.4 kg)  01/11/21 194 lb 6.4 oz (88.2 kg)  01/09/21 193 lb (87.5 kg)    Health Maintenance Due  Topic Date Due   Zoster Vaccines- Shingrix (1 of 2) Never done   URINE MICROALBUMIN  11/08/2020   OPHTHALMOLOGY EXAM  02/15/2021   INFLUENZA VACCINE  04/01/2021    There are no preventive care reminders to display for this patient.   Lab Results  Component Value Date   TSH 3.61 06/21/2020   Lab Results  Component  Value Date   WBC 6.0 10/24/2020   HGB 11.8 (L) 10/24/2020   HCT 39.1 10/24/2020   MCV 72 (L) 10/24/2020   PLT 296 10/24/2020   Lab Results  Component Value Date   NA 134 10/24/2020   K 4.1 10/24/2020   CO2 23 10/24/2020   GLUCOSE 131 (H) 10/24/2020   BUN 7 10/24/2020   CREATININE 0.64 (L) 10/24/2020   BILITOT 1.0 10/24/2020   ALKPHOS 164 (H) 10/24/2020   AST 28 10/24/2020   ALT 20 10/24/2020   PROT 7.3 10/24/2020   ALBUMIN 4.2 10/24/2020   CALCIUM 9.7 10/24/2020   ANIONGAP 8 10/11/2019   Lab Results  Component Value Date   CHOL 135 10/24/2020   Lab Results  Component Value Date   HDL 34 (L) 10/24/2020   Lab Results  Component Value Date   LDLCALC 83 10/24/2020   Lab Results  Component Value Date   TRIG 94 10/24/2020   Lab Results  Component Value Date   CHOLHDL 7.1 (H) 06/21/2020   Lab Results  Component Value Date   HGBA1C 6.1 10/17/2020       Assessment & Plan:   Problem List Items Addressed This Visit       Cardiovascular and Mediastinum   Essential hypertension, benign    BP Readings from Last 3 Encounters:  04/02/21 121/71  01/11/21 140/82  01/09/21 126/80  -BP well controlled; no changes to antihypertensives      Relevant Orders   CBC with Differential/Platelet   CMP14+EGFR   Lipid Panel With LDL/HDL Ratio   DM type 2 causing vascular disease ([place])    -checking A1c with today's labs -takes glipizide and metformin for glycemic control -on statin -NO ACEi d/t angioedema hx       Relevant Orders   CBC with Differential/Platelet   CMP14+EGFR   Lipid Panel With LDL/HDL Ratio   Hemoglobin A1c     Digestive   Cirrhosis of liver (HCC)    -hx of alcohol abuse -had Feb 2022 U/S that was suspicious for cirrhosis -he states he has cut out liquor, but may have one beer  every now and then after hard work on a yard -has f/u with Marylee Floras; recommended repeat U/S around end of August, and he will see GI around that time        Relevant Orders   CBC with Differential/Platelet   CMP14+EGFR     Other   Mixed hyperlipidemia    -on statin -checking labs today       Relevant Orders   Lipid Panel With LDL/HDL Ratio   IDA (iron deficiency anemia)    -checking iron panel with today's labs -has been taking current iron supplement but ran out about 2 weeks ago; refilled today       Relevant Medications   ferrous sulfate 325 (65 FE) MG tablet   Other Relevant Orders   Iron, TIBC and Ferritin Panel   CBC with Differential/Platelet   Iron, TIBC and Ferritin Panel     Meds ordered this encounter  Medications   ferrous sulfate 325 (65 FE) MG tablet    Sig: Take 1 tablet (325 mg total) by mouth daily with breakfast.    Dispense:  90 tablet    Refill:  [place], NP

## 2021-04-02 NOTE — Assessment & Plan Note (Signed)
-  checking iron panel with today's labs -has been taking current iron supplement but ran out about 2 weeks ago; refilled today

## 2021-04-02 NOTE — Assessment & Plan Note (Signed)
-  checking A1c with today's labs -takes glipizide and metformin for glycemic control -on statin -NO ACEi d/t angioedema hx

## 2021-04-03 ENCOUNTER — Ambulatory Visit (INDEPENDENT_AMBULATORY_CARE_PROVIDER_SITE_OTHER): Payer: Medicare Other

## 2021-04-03 DIAGNOSIS — J309 Allergic rhinitis, unspecified: Secondary | ICD-10-CM

## 2021-04-08 ENCOUNTER — Other Ambulatory Visit: Payer: Self-pay | Admitting: "Endocrinology

## 2021-04-10 ENCOUNTER — Ambulatory Visit (INDEPENDENT_AMBULATORY_CARE_PROVIDER_SITE_OTHER): Payer: Medicare Other | Admitting: *Deleted

## 2021-04-10 DIAGNOSIS — J309 Allergic rhinitis, unspecified: Secondary | ICD-10-CM

## 2021-04-11 DIAGNOSIS — E782 Mixed hyperlipidemia: Secondary | ICD-10-CM | POA: Diagnosis not present

## 2021-04-11 DIAGNOSIS — I10 Essential (primary) hypertension: Secondary | ICD-10-CM | POA: Diagnosis not present

## 2021-04-11 DIAGNOSIS — E1159 Type 2 diabetes mellitus with other circulatory complications: Secondary | ICD-10-CM | POA: Diagnosis not present

## 2021-04-11 DIAGNOSIS — D509 Iron deficiency anemia, unspecified: Secondary | ICD-10-CM | POA: Diagnosis not present

## 2021-04-12 ENCOUNTER — Other Ambulatory Visit: Payer: Self-pay | Admitting: Nurse Practitioner

## 2021-04-12 ENCOUNTER — Telehealth: Payer: Self-pay | Admitting: *Deleted

## 2021-04-12 DIAGNOSIS — E1159 Type 2 diabetes mellitus with other circulatory complications: Secondary | ICD-10-CM

## 2021-04-12 LAB — IRON,TIBC AND FERRITIN PANEL
Ferritin: 59 ng/mL (ref 30–400)
Iron Saturation: 22 % (ref 15–55)
Iron: 83 ug/dL (ref 38–169)
Total Iron Binding Capacity: 375 ug/dL (ref 250–450)
UIBC: 292 ug/dL (ref 111–343)

## 2021-04-12 LAB — CBC WITH DIFFERENTIAL/PLATELET
Basophils Absolute: 0.1 10*3/uL (ref 0.0–0.2)
Basos: 1 %
EOS (ABSOLUTE): 0.2 10*3/uL (ref 0.0–0.4)
Eos: 3 %
Hematocrit: 44.4 % (ref 37.5–51.0)
Hemoglobin: 15 g/dL (ref 13.0–17.7)
Immature Grans (Abs): 0 10*3/uL (ref 0.0–0.1)
Immature Granulocytes: 0 %
Lymphocytes Absolute: 1.5 10*3/uL (ref 0.7–3.1)
Lymphs: 20 %
MCH: 29.4 pg (ref 26.6–33.0)
MCHC: 33.8 g/dL (ref 31.5–35.7)
MCV: 87 fL (ref 79–97)
Monocytes Absolute: 0.6 10*3/uL (ref 0.1–0.9)
Monocytes: 8 %
Neutrophils Absolute: 5 10*3/uL (ref 1.4–7.0)
Neutrophils: 68 %
Platelets: 180 10*3/uL (ref 150–450)
RBC: 5.11 x10E6/uL (ref 4.14–5.80)
RDW: 13.2 % (ref 11.6–15.4)
WBC: 7.2 10*3/uL (ref 3.4–10.8)

## 2021-04-12 LAB — CMP14+EGFR
ALT: 33 IU/L (ref 0–44)
AST: 28 IU/L (ref 0–40)
Albumin/Globulin Ratio: 1.5 (ref 1.2–2.2)
Albumin: 4.2 g/dL (ref 3.8–4.9)
Alkaline Phosphatase: 145 IU/L — ABNORMAL HIGH (ref 44–121)
BUN/Creatinine Ratio: 11 (ref 9–20)
BUN: 8 mg/dL (ref 6–24)
Bilirubin Total: 0.9 mg/dL (ref 0.0–1.2)
CO2: 26 mmol/L (ref 20–29)
Calcium: 9.7 mg/dL (ref 8.7–10.2)
Chloride: 97 mmol/L (ref 96–106)
Creatinine, Ser: 0.7 mg/dL — ABNORMAL LOW (ref 0.76–1.27)
Globulin, Total: 2.8 g/dL (ref 1.5–4.5)
Glucose: 189 mg/dL — ABNORMAL HIGH (ref 65–99)
Potassium: 4.4 mmol/L (ref 3.5–5.2)
Sodium: 138 mmol/L (ref 134–144)
Total Protein: 7 g/dL (ref 6.0–8.5)
eGFR: 108 mL/min/{1.73_m2} (ref >59)

## 2021-04-12 LAB — LIPID PANEL WITH LDL/HDL RATIO
Cholesterol, Total: 124 mg/dL (ref 100–199)
HDL: 28 mg/dL — ABNORMAL LOW (ref >39)
LDL Chol Calc (NIH): 74 mg/dL (ref 0–99)
LDL/HDL Ratio: 2.6 ratio (ref 0.0–3.6)
Triglycerides: 118 mg/dL (ref 0–149)
VLDL Cholesterol Cal: 22 mg/dL (ref 5–40)

## 2021-04-12 LAB — HEMOGLOBIN A1C
Est. average glucose Bld gHb Est-mCnc: 212 mg/dL
Hgb A1c MFr Bld: 9 % — ABNORMAL HIGH (ref 4.8–5.6)

## 2021-04-12 MED ORDER — TIRZEPATIDE 5 MG/0.5ML ~~LOC~~ SOAJ: 5.0000 mg | SUBCUTANEOUS | 0 refills | Status: DC

## 2021-04-12 MED ORDER — TIRZEPATIDE 2.5 MG/0.5ML ~~LOC~~ SOAJ: 2.5000 mg | SUBCUTANEOUS | 0 refills | Status: DC

## 2021-04-12 NOTE — Chronic Care Management (AMB) (Signed)
  Chronic Care Management   Outreach Note  04/12/2021 Name: Randy Hebert MRN: [id number] DOB: 26-Nov-1965  Randy Hebert is a 55 y.o. year old male who is a primary care patient of Randy Larsson, Randy Hebert. I reached out to Randy Hebert by phone today in response to a referral sent by Randy Hebert [facility] PCP Randy Larsson, Randy Hebert     An unsuccessful telephone outreach was attempted today. The patient was referred to the case management team for assistance with care management and care coordination.   Follow Up Plan: A HIPAA compliant phone message was left for the patient providing contact information and requesting a return call.  If patient returns call to provider office, please advise to call Embedded Care Management Care Guide Randy Hebert at [place], [place] Management  Direct Dial: [phone]

## 2021-04-12 NOTE — Progress Notes (Signed)
A1c is 9.0, so blood sugar is out of control. I sent in the new medication Mounjaro and a referral to pharmacy to help get the price of the medication lowered. He will get a call from our pharmacist within the week.

## 2021-04-15 NOTE — Chronic Care Management (AMB) (Signed)
  Chronic Care Management   Outreach Note  04/15/2021 Name: Randy Hebert MRN: [id number] DOB: 1966/07/18  Randy Hebert is a 55 y.o. year old male who is a primary care patient of Randy Larsson, NP. I reached out to Randy Hebert by phone today in response to a referral sent by Randy Hebert [facility] PCP, Randy Larsson, NP      A second unsuccessful telephone outreach was attempted today. The patient was referred to the case management team for assistance with care management and care coordination.   Follow Up Plan: A HIPAA compliant phone message was left for the patient providing contact information and requesting a return call. The care management team will reach out to the patient again over the next 7 days.  If patient returns call to provider office, please advise to call [place] at [phone].  [place] Management  Direct Dial: [phone]

## 2021-04-15 NOTE — Chronic Care Management (AMB) (Signed)
  Chronic Care Management   Note  04/15/2021 Name: HAIDER HORNADAY MRN: [id number] DOB: [birth year]  Satira Anis Jorge is a 55 y.o. year old male who is a primary care patient of Noreene Larsson, NP. I reached out to [name] by phone today in response to a referral sent by Mr. Aubra Pappalardo [facility] PCP Noreene Larsson, NP     Mr. Davids was given information about Chronic Care Management services today including:  CCM service includes personalized support from designated clinical staff supervised by his physician, including individualized plan of care and coordination with other care providers 24/7 contact phone numbers for assistance for urgent and routine care needs. Service will only be billed when office clinical staff spend 20 minutes or more in a month to coordinate care. Only one practitioner may furnish and bill the service in a calendar month. The patient may stop CCM services at any time (effective at the end of the month) by phone call to the office staff. The patient will be responsible for cost sharing (co-pay) of up to 20% of the service fee (after annual deductible is met).  Patient agreed to services and verbal consent obtained.   Follow up plan: Telephone appointment with care management team member scheduled for: 04/18/2021  Julian Hy, [place] Management  Direct Dial: [phone]

## 2021-04-16 ENCOUNTER — Telehealth: Payer: Self-pay | Admitting: Gastroenterology

## 2021-04-16 ENCOUNTER — Other Ambulatory Visit: Payer: Self-pay

## 2021-04-16 ENCOUNTER — Encounter: Payer: Medicare Other | Admitting: "Endocrinology

## 2021-04-16 DIAGNOSIS — K7460 Unspecified cirrhosis of liver: Secondary | ICD-10-CM

## 2021-04-16 DIAGNOSIS — K824 Cholesterolosis of gallbladder: Secondary | ICD-10-CM

## 2021-04-16 NOTE — Telephone Encounter (Signed)
Patient received letter to schedule ultrasound  

## 2021-04-16 NOTE — Addendum Note (Signed)
Addended by: Hassan Rowan on: 04/16/2021 04:39 PM   Modules accepted: Orders

## 2021-04-16 NOTE — Telephone Encounter (Signed)
[place] abd ruq scheduled for 04/25/21 at 10:30am, arrive at 10:15am. NPO after midnight prior to test.  Called and informed pt of [place] appt.

## 2021-04-17 ENCOUNTER — Encounter: Payer: Self-pay | Admitting: Cardiology

## 2021-04-17 ENCOUNTER — Ambulatory Visit (INDEPENDENT_AMBULATORY_CARE_PROVIDER_SITE_OTHER): Payer: Medicare Other | Admitting: Cardiology

## 2021-04-17 VITALS — BP 120/70 | HR 90 | Ht 65.0 in | Wt 208.0 lb

## 2021-04-17 DIAGNOSIS — E782 Mixed hyperlipidemia: Secondary | ICD-10-CM | POA: Diagnosis not present

## 2021-04-17 DIAGNOSIS — I2510 Atherosclerotic heart disease of native coronary artery without angina pectoris: Secondary | ICD-10-CM

## 2021-04-17 DIAGNOSIS — I10 Essential (primary) hypertension: Secondary | ICD-10-CM | POA: Diagnosis not present

## 2021-04-17 DIAGNOSIS — Z87898 Personal history of other specified conditions: Secondary | ICD-10-CM | POA: Diagnosis not present

## 2021-04-17 NOTE — Progress Notes (Signed)
Clinical Summary Mr. Henken is a 55 y.o.male seen today for follow up of the following medical problems.      1. CAD - admit 01/2015 with lateral STEMI, received DES to D1. There was a 70% ramus and 40% mid RCA that was medically managed - 01/2015 echo LVEF 40%, lateral wall hypokinesis - no beta blocker due to history of sinus pause and bradycardia. No ACE-I given history of severe angioedema, no ARB due to risk of cross reactivity - admit 12/2015 with atypical chest pain, worst with palpatoin and movement. Had done some heavy lifting the day it started, negative workup for ACS.  Since that time no recurrent symptoms     04/2017 PCI with stent.  -07/2018 nuclear stress prior infarct, no current ischemia   - no recent chest pains. Chronic SOB/DOE he attributes to his COPD, has had some allergies, hot weather affects.       2. SOB/COPD - 07/2015 PFTs: mild to mod ventilatory defect with small airway obstruction.   - 07/2015 CT PE no PE, though suboptimal study - 07/2015 CXR no acute process.   PFTs with mild to mod vent defect, ABGs showed borderline resting hypoxemia.     - followed by Dr [name].      3. Hyperlipidemia  - endocrine lowered atorva from 80 to 40mg  due to elevated LFTs.      04/2021 TC 124 TG 118 HDL 28 LDL 74 - he is on atorvastatin   4. Syncope - nocturnal bradycardia noted on previous monitors, no significant daytime episodes - has loop recorder, followed by EP - no beta blockers due to sinus pauses.      -Jan 2022 loop recorder no events - no recent syncope. Intermittent dizzy spells, sometimes with standing or bending over - takes lasix only 1-2 times per month. On chlorthaldione 12.5mg  daily   - no recent syncope    5. HTN -compliant with meds       6. OSA     SH: within last few years his son overdosed, passed away Girlfriend passed on Thanksgiving. THey lived together, he is working on moving.  Currently in low income  apartment  Past Medical History:  Diagnosis Date   ACE inhibitor-aggravated angioedema    Allergy    Angio-edema    Anxiety    Arthritis    Asthma    hip replacement   Back pain    Bradycardia 04/28/2017   Bulging of cervical intervertebral disc    CAD (coronary artery disease)    lateral STEMI 02/06/2015 00% D1 occlusion treated with Promus Premier 2.5 mm x 16 mm DES, 70% ramus stenosis, 40% mid RCA stenosis, 45% distal RCA stenosis, EF 45-50%   CHF (congestive heart failure) (HCC)    COPD (chronic obstructive pulmonary disease) ([place])    Depression    Diabetes mellitus without complication ([place])    Difficult intubation    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   Dry eye    Dyspnea    Early satiety 09/23/2016   GERD (gastroesophageal reflux disease)    HCAP (healthcare-associated pneumonia) 05/15/2017   Headache    Heart murmur    Hip pain    Hyperlipidemia    Hypertension    Lobar pneumonia ([place]) 05/15/2017   Melena 08/04/2018   MI (myocardial infarction) ([place])    Myocardial infarction ([place])    Neck pain    Non-ST elevation (NSTEMI) myocardial infarction ([place]) 04/27/2017  NSTEMI (non-ST elevated myocardial infarction) ([place]) 04/26/2017   Otitis media    Pleurisy    Pneumonia due to COVID-19 virus 10/07/2019   Rectal bleeding 11/08/2018   Right shoulder pain 03/20/2020   Sinus pause    9 sec sinus pause on telemetry after started on coreg after MI, avoid AV nodal blocking agent   Sleep apnea    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 Longest one 9 sec. No AV nodal agent   Lisinopril Anaphylaxis, Shortness  Of Breath and Swelling    Angioedema, required intubation and mechanical ventilation   Amoxicillin Nausea And Vomiting and Nausea Only    Nausea only - no allergy  Did it involve swelling of the face/tongue/throat, SOB, or low BP? No Did it involve sudden or severe rash/hives, skin peeling, or any reaction on the inside of your mouth or nose? No Did you need to seek medical attention at a hospital or doctor's office? No When did it last happen? childhood reaction      If all above answers are "NO", may proceed with cephalosporin use.      Current Outpatient Medications  Medication Sig Dispense Refill   acetaminophen (TYLENOL) 500 MG tablet Take 1,000 mg by mouth every 4 (four) hours as needed for mild pain or fever.      albuterol (PROVENTIL HFA) 108 (90 Base) MCG/ACT inhaler INHALE 2 PUFFS INTO THE LUNGS EVERY 6 HOURS AS NEEDED FOR SHORTNESS OF BREATH OR WHEEZING. 18 g 1   albuterol (PROVENTIL) (2.5 MG/3ML) 0.083% nebulizer solution Take 3 mLs (2.5 mg total) by nebulization every 6 (six) hours as needed for wheezing or shortness of breath. 150 mL 1   amLODipine (NORVASC) 10 MG tablet TAKE 1 TABLET BY MOUTH ONCE A DAY. 90 tablet 0   ascorbic acid (VITAMIN C) 500 MG tablet Take 1 tablet (500 mg total) by mouth daily. (Patient taking differently: Take 500 mg by mouth every evening.) 30 tablet 1   aspirin 81 MG EC tablet Take 1 tablet (81 mg total) by mouth daily. (Patient taking differently: Take 81 mg by mouth 2 (two) times daily.) 30 tablet    atorvastatin (LIPITOR) 40 MG tablet Take 1 tablet (40 mg total) by mouth every evening. 30 tablet 6   azelastine (ASTELIN) 0.1 % nasal spray USE 2 SPRAYS IN EACH NOSTRIL TWICE DAILY. 30 mL 0   budesonide-formoterol (SYMBICORT) 160-4.5 MCG/ACT inhaler Inhale 2 puffs into the lungs 2 (two) times daily. 10.2 g 5   chlorthalidone (HYGROTON) 25 MG tablet Take 0.5 tablets (12.5 mg total) by mouth daily. 45 tablet 1   Cholecalciferol (VITAMIN D3) 125 MCG  (5000 UT) CAPS Take 1 capsule (5,000 Units total) by mouth daily. (Patient taking differently: Take 5,000 Units by mouth daily in the afternoon.) 90 capsule 0   citalopram (CELEXA) 20 MG tablet Take 20 mg by mouth daily.     cycloSPORINE (RESTASIS) 0.05 % ophthalmic emulsion Place 1 drop into both eyes 2 (two) times daily.     ferrous sulfate 325 (65 FE) MG tablet Take 1 tablet (325 mg total) by mouth daily with  breakfast. 90 tablet 1   furosemide (LASIX) 40 MG tablet Take 1 tablet (40 mg total) by mouth daily as needed for fluid. 30 tablet 11   glipiZIDE (GLUCOTROL XL) 5 MG 24 hr tablet TAKE 1 TABLET BY MOUTH DAILY WITH BREAKFAST. 90 tablet 0   glucose blood (ACCU-CHEK AVIVA PLUS) test strip Use as instructed to check blood glucose once daily, DX E11.59 100 each 12   magnesium gluconate (MAGONATE) 500 MG tablet Take 500 mg by mouth 2 (two) times daily.     metFORMIN (GLUCOPHAGE) 1000 MG tablet TAKE 1 TABLET BY MOUTH TWICE DAILY WITH MEALS. 180 tablet 0   mometasone (ELOCON) 0.1 % cream Apply 1 application topically 2 (two) times a week.      nitroGLYCERIN (NITROSTAT) 0.4 MG SL tablet Place 1 tablet (0.4 mg total) under the tongue every 5 (five) minutes as needed for chest pain. 25 tablet 3   olopatadine (PATANOL) 0.1 % ophthalmic solution 1 drop 2 (two) times daily.     pantoprazole (PROTONIX) 40 MG tablet Take 1 tablet (40 mg total) by mouth 2 (two) times daily before a meal. 60 tablet 3   potassium chloride SA (KLOR-CON) 20 MEQ tablet Take 2 tablets (40 mEq total) by mouth daily. 180 tablet 1   prednisoLONE acetate (PRED FORTE) 1 % ophthalmic suspension Place 4 drops into both eyes 2 (two) times a week.      Tiotropium Bromide Monohydrate (SPIRIVA RESPIMAT) 2.5 MCG/ACT AERS Inhale 1 puff into the lungs daily. 4 g 5   tirzepatide (MOUNJARO) 2.5 MG/0.5ML Pen Inject 2.5 mg into the skin once a week for 28 days. 2 mL 0   [START ON 05/10/2021] tirzepatide (MOUNJARO) 5 MG/0.5ML Pen Inject 5 mg into the  skin once a week for 28 days. 2 mL 0   triamcinolone ointment (KENALOG) 0.1 % Apply 1 application topically 2 (two) times daily. 454 g 1   No current facility-administered medications for this visit.     Past Surgical History:  Procedure Laterality Date   BIOPSY  10/09/2016   Procedure: BIOPSY;  Surgeon: Daneil Dolin, MD;  [facility];  Service: Endoscopy;;   BIOPSY  11/28/2019   Procedure: BIOPSY;  Surgeon: Daneil Dolin, MD;  [facility];  Service: Endoscopy;;   CARDIAC CATHETERIZATION N/A 02/06/2015   Procedure: Left Heart Cath and Coronary Angiography;  Surgeon: Leonie Man, MD;  Location: [facility];  Service: Cardiovascular;  Laterality: N/A;   CARDIAC CATHETERIZATION N/A 02/06/2015   Procedure: Coronary Stent Intervention;  Surgeon: Leonie Man, MD;  Location: [facility];  Service: Cardiovascular;  Laterality: N/A;   COLONOSCOPY WITH PROPOFOL N/A 10/09/2016   Sigmoid and descending colon diverticulosis, four 4-6 mm polyps in sigmoid, one 4 mm polyp in descending. Tubular adenomas and hyperplastic. 5 year surveillance.    COLONOSCOPY WITH PROPOFOL N/A 11/24/2016   Sigmoid and descending colon diverticulosis, four 4-6 mm polyps in sigmoid, one 4 mm polyp in descending. Tubular adenomas and hyperplastic. 5 year surveillance.    CORONARY ANGIOPLASTY WITH STENT PLACEMENT  01/2015   CORONARY STENT INTERVENTION N/A 04/27/2017   Procedure: CORONARY STENT INTERVENTION;  Surgeon: Nelva Bush, MD;  Location: [facility];  Service: Cardiovascular;  Laterality: N/A;   ELECTROPHYSIOLOGY STUDY N/A 06/29/2017   Procedure: ELECTROPHYSIOLOGY STUDY;  Surgeon: Evans Lance, MD;  Location: [facility];  Service: Cardiovascular;  Laterality: N/A;   ESOPHAGOGASTRODUODENOSCOPY (EGD) WITH PROPOFOL  N/A 10/09/2016   Dr. Gala Romney: LA grade a esophagitis.  Barrett's esophagus, biopsy-proven.  Small hiatal hernia.  EGD February 2019    ESOPHAGOGASTRODUODENOSCOPY (EGD) WITH PROPOFOL N/A 11/28/2019    salmon-colored esophageal mucosa (Barrett's) small hiatal hernia, portal hypertensive gastropathy, normal duodenum, 3 year surveillance   INCISION / DRAINAGE HAND / FINGER     LEFT HEART CATH AND CORONARY ANGIOGRAPHY N/A 04/27/2017   Procedure: LEFT HEART CATH AND CORONARY ANGIOGRAPHY;  Surgeon: Nelva Bush, MD;  Location: [facility];  Service: Cardiovascular;  Laterality: N/A;   LOOP RECORDER INSERTION  06/29/2017   Procedure: Loop Recorder Insertion;  Surgeon: Evans Lance, MD;  Location: [facility];  Service: Cardiovascular;;   POLYPECTOMY  11/24/2016   Procedure: POLYPECTOMY;  Surgeon: Daneil Dolin, MD;  [facility];  Service: Endoscopy;;  descending and sigmoid   TOTAL HIP ARTHROPLASTY Right 07/01/2016   TOTAL HIP ARTHROPLASTY Right 07/01/2016   Procedure: RIGHT TOTAL HIP ARTHROPLASTY ANTERIOR APPROACH;  Surgeon: Mcarthur Rossetti, MD;  Location: [facility];  Service: Orthopedics;  Laterality: Right;     Allergies  Allergen Reactions   Carvedilol Other (See Comments)    Sinus pause on telemetry >3 seconds. Longest one 9 sec. No AV nodal agent   Lisinopril Anaphylaxis, Shortness Of Breath and Swelling    Angioedema, required intubation and mechanical ventilation   Amoxicillin Nausea And Vomiting and Nausea Only    Nausea only - no allergy  Did it involve swelling of the face/tongue/throat, SOB, or low BP? No Did it involve sudden or severe rash/hives, skin peeling, or any reaction on the inside of your mouth or nose? No Did you need to seek medical attention at a hospital or doctor's office? No When did it last happen? childhood reaction      If all above answers are "NO", may proceed with cephalosporin use.       Family History  Problem Relation Age of Onset   Heart attack Father    Stroke Father    Arthritis Father    Heart disease Father    Cancer Mother        ???    Arthritis Mother    Heart disease Brother 73       died in sleep   Early death Brother    Diabetes Maternal Uncle    Alzheimer's disease Maternal Grandmother      Social History Mr. Ganoe reports that he has been smoking cigarettes. He has a 46.00 pack-year smoking history. He has quit using smokeless tobacco.  His smokeless tobacco use included chew. Mr. Heikkila reports current alcohol use.   Review of Systems CONSTITUTIONAL: No weight loss, fever, chills, weakness or fatigue.  HEENT: Eyes: No visual loss, blurred vision, double vision or yellow sclerae.No hearing loss, sneezing, congestion, runny nose or sore throat.  SKIN: No rash or itching.  CARDIOVASCULAR: per hpi RESPIRATORY: No shortness of breath, cough or sputum.  GASTROINTESTINAL: No anorexia, nausea, vomiting or diarrhea. No abdominal pain or blood.  GENITOURINARY: No burning on urination, no polyuria NEUROLOGICAL: No headache, dizziness, syncope, paralysis, ataxia, numbness or tingling in the extremities. No change in bowel or bladder control.  MUSCULOSKELETAL: No muscle, back pain, joint pain or stiffness.  LYMPHATICS: No enlarged nodes. No history of splenectomy.  PSYCHIATRIC: No history of depression or anxiety.  ENDOCRINOLOGIC: No reports of sweating, cold or heat intolerance. No polyuria or polydipsia.  Marland Kitchen   Physical Examination Today's Vitals  04/17/21 0914  BP: 120/70  Pulse: 90  SpO2: 95%  Weight: 208 lb (94.3 kg)  Height: 5\' 5"  (1.651 m)   Body mass index is 34.61 kg/m.  Gen: resting comfortably, no acute distress HEENT: no scleral icterus, pupils equal round and reactive, no palptable cervical adenopathy,  CV: RRR, no m/r/g, no jvd Resp: Clear to auscultation bilaterally GI: abdomen is soft, non-tender, non-distended, normal bowel sounds, no hepatosplenomegaly MSK: extremities are warm, no edema.  Skin: warm, no rash Neuro:  no focal deficits Psych: appropriate affect   Diagnostic  Studies  01/2015 cath 1st Diag lesion, 100% stenosed. A Promus Premier 2.5 mm x 16 mm drug-eluting stent was placed. There is a 0% residual stenosis post intervention. Ramus lesion, 70% stenosed. Mid RCA lesion, 40% stenosed. Dist RCA lesion, 45% stenosed. Mild to moderately reduced LVEF with anterolateral and apical hypokinesis and elevated LVEDP   Abnormal anatomy with equal sized Diagonal and LAD Lynae Pederson but extensive RCA. Successful PCI of the First Diagonal Ariez Neilan.   Recommendations: Standard post radial cath/PCI TR band removal Dual independent therapy for minimum one year. Check 2-D echocardiogram to better assess EF Add statin and beta blocker. Smoking cessation counseling. Cardiac Rehabilitation And Case Management Consultation If hemodynamic stable, would consider fast-track discharge Would consider noninvasive evaluation of the Ramus Intermedius lesion in the absence of any recurrent anginal pain.   01/2015 echo Study Conclusions  - Left ventricle: Moderately severe hypokinesis of mid   anterolateral segment, apical lateral segment, mid/apical   anterior segments. EF is 40%. The cavity size was normal. Wall   thickness was increased in a pattern of mild LVH. - Aortic valve: Sclerosis without stenosis. There was no   significant regurgitation. - Right ventricle: The cavity size was normal. Systolic function   was normal.   07/2015 CT PE IMPRESSION: 1. Suboptimal contrast bolus timing in the pulmonary arterial tree, poor in the lobar vessels other than the right lower lobe. No central pulmonary embolus. If there is continued suspicion of acute PE nuclear medicine V/Q scan may be most helpful. 2. Situs abnormality in the chest with right side aortic arch and descending aorta. Mirror image branching. Other visible situs including cardiac and upper abdominal situs appear normal. 3. Calcified Coronary artery atherosclerosis. Hepatomegaly with fatty liver disease.    07/2015 PFTs: mild to mod ventilatory defect with small airway obstruction, moderately reduced DLCO.    07/2018 nuclear stress There was no ST segment deviation noted during stress. Defect 1: There is a medium defect of moderate severity present in the mid anterior, mid anterolateral and apical anterior location. Findings consistent with prior myocardial infarction. This is an intermediate risk study. Nuclear stress EF: 53%.   Assessment and Plan  1. CAD -denies symptoms, continue current meds     2. Hyperlipideima - reasonable control given limitations on statin dosing given prior elevation of LFTs  - we will continue current statin   3. HTN - bp at goal, continue current meds   4. History of syncope/bradycardia - loop recorder monitored by EP - no recent episodes, continue to monitor.  - avoid all AV nodal agents      Arnoldo Lenis, M.D.

## 2021-04-17 NOTE — Progress Notes (Signed)
This encounter was created in error - please disregard.

## 2021-04-17 NOTE — Patient Instructions (Signed)
Medication Instructions:  Continue all current medications.   Labwork: none  Testing/Procedures: none  Follow-Up: 6 months   Any Other Special Instructions Will Be Listed Below (If Applicable).   If you need a refill on your cardiac medications before your next appointment, please call your pharmacy.  

## 2021-04-18 ENCOUNTER — Ambulatory Visit (INDEPENDENT_AMBULATORY_CARE_PROVIDER_SITE_OTHER): Payer: Medicare Other | Admitting: Pharmacist

## 2021-04-18 ENCOUNTER — Ambulatory Visit: Payer: Medicare Other | Admitting: Gastroenterology

## 2021-04-18 DIAGNOSIS — I10 Essential (primary) hypertension: Secondary | ICD-10-CM

## 2021-04-18 DIAGNOSIS — I5042 Chronic combined systolic (congestive) and diastolic (congestive) heart failure: Secondary | ICD-10-CM

## 2021-04-18 DIAGNOSIS — E1159 Type 2 diabetes mellitus with other circulatory complications: Secondary | ICD-10-CM | POA: Diagnosis not present

## 2021-04-18 DIAGNOSIS — I2510 Atherosclerotic heart disease of native coronary artery without angina pectoris: Secondary | ICD-10-CM

## 2021-04-18 DIAGNOSIS — J449 Chronic obstructive pulmonary disease, unspecified: Secondary | ICD-10-CM

## 2021-04-18 DIAGNOSIS — I5022 Chronic systolic (congestive) heart failure: Secondary | ICD-10-CM

## 2021-04-18 DIAGNOSIS — Z6834 Body mass index (BMI) 34.0-34.9, adult: Secondary | ICD-10-CM

## 2021-04-18 DIAGNOSIS — E782 Mixed hyperlipidemia: Secondary | ICD-10-CM | POA: Diagnosis not present

## 2021-04-18 DIAGNOSIS — E6609 Other obesity due to excess calories: Secondary | ICD-10-CM

## 2021-04-18 DIAGNOSIS — F17219 Nicotine dependence, cigarettes, with unspecified nicotine-induced disorders: Secondary | ICD-10-CM

## 2021-04-18 NOTE — Patient Instructions (Signed)
Randy Hebert,  It was great to talk to you today!  Please call me with any questions or concerns.   Visit Information   PATIENT GOALS:   Goals Addressed             This Visit's Progress    Medication Management       Patient Goals/Self-Care Activities Over the next 90  days, patient will:  Take medications as prescribed Focus on medication adherence by keeping up with prescription refills and either using a pill box or reminders to take your medications at the prescribed times Check blood sugar three times a day at the following times: fasting (at least 8 hours since last food consumption), 1-2 hours after breakfast, bedtime, and whenever patient experiences symptoms of hypo/hyperglycemia, document, and provide at future appointments Check blood pressure at least once daily, document, and provide at future appointments Weigh daily, and contact provider if weight gain of more than 3 lbs in 1 day or more than 5 lbs in 1 week Collaborate with provider on medication access solutions Target a minimum of 150 minutes of moderate intensity exercise weekly Engage in dietary modifications by fewer sweetened foods & beverages, better food choices, and watch portion sizes/amount of food eaten at one time        Consent to CCM Services: [name] was given information about Chronic Care Management services including:  CCM service includes personalized support from designated clinical staff supervised by his physician, including individualized plan of care and coordination with other care providers 24/7 contact phone numbers for assistance for urgent and routine care needs. Service will only be billed when office clinical staff spend 20 minutes or more in a month to coordinate care. Only one practitioner may furnish and bill the service in a calendar month. The patient may stop CCM services at any time (effective at the end of the month) by phone call to the office staff. The patient  will be responsible for cost sharing (co-pay) of up to 20% of the service fee (after annual deductible is met).  Patient agreed to services and verbal consent obtained.   The patient verbalized understanding of instructions, educational materials, and care plan provided today and agreed to receive a mailed copy of patient instructions, educational materials, and care plan.   Telephone follow up appointment with care management team member scheduled for: Next PCP appointment scheduled for:   Kennon Holter, PharmD Clinical Pharmacist [facility] [phone]  CLINICAL CARE PLAN: Patient Care Plan: Medication Management     Problem Identified: Diabetes, Hypertension, Coronar Artery Disease, Hyperlipidemia, COPD/Asthma, Smoking Cessation, Weight Management   Priority: High  Onset Date: 04/18/2021     Long-Range Goal: Disease Progression Prevention   Start Date: 04/18/2021  Expected End Date: 07/17/2021  This Visit's Progress: On track  Priority: High  Note:   Current Barriers:  Unable to achieve control of diabetes  Pharmacist Clinical Goal(s):  Over the next 90  days, patient will Achieve control of diabetes as evidenced by improved A1c through collaboration with PharmD and provider.   Interventions: 1:1 collaboration with Noreene Larsson, NP regarding development and update of comprehensive plan of care as evidenced by provider attestation and co-signature Inter-disciplinary care team collaboration (see longitudinal plan of care) Comprehensive medication review performed; medication list updated in electronic medical record  Type 2 Diabetes (Sees Endocrinology - Dr. Dorris Fetch): Current medications: metformin 1,000 mg by mouth  twice daily meals and glipizide XL 5 mg by mouth with  breakfast Patient's insurance does not cover [employer] and since he has Medicare, unable to use co-pay card. Will remove from medication list. Intolerances: none Taking medications as  directed: yes Side effects thought to be attributed to current medication regimen: no Denies recent hypoglycemic/hyperglycemic symptoms although did report frequent urination when he was drinking a lot of strawberry/peach drinks and slushies a few weeks ago Hypoglycemia prevention: not indicated at this time Current meal patterns: breakfast: cereal, banana sandwich, tasted ham/cheese, and biscuit; lunch: burger and corn dog; dinner: pizza, sub, and stew; snacks: chips and crackers; drinks: water and coffee with sugar Current exercise: goes to [facility] but has not been x1.5 months On a statin: [x]  Yes  []  No    Last microalbumin: 1.8 (11/09/19); on an ACEi/ARB: []  Yes  [x]  No, contraindicated due to angioedema Last eye exam: Feb/March 2021 per patient; next scheduled Oct 2022 Last foot exam: unknown Pneumonia vaccine: up to date Current glucose readings: fasting blood glucose: above goal of 80-130 mg/dL per ADA guidelines; BG today and yesterday 211 and 176 (fasting) Uncontrolled; Most recent A1c above goal of <7% per ADA guidelines Introduction to diabetes basic facts Medication: Identify diabetes medication and when best taken Monitoring: target blood sugar range, when to test Signs and symptoms of hyperglycemia Hypoglycemia: I have discussed with the patient how to treat hypoglycemia by the rule of 15; eat/drink 15g of sugar in the form of glucose tabs, 4 ounces of juice or soda and recheck fingerstick glucose in 15 minutes. Chocolate bars and ice cream should be avoided because fat delays carbohydrate digestion and absorption. Retreat if glucose remains low. Driving should cease until glucose is normal. Need for daily foot inspection and annual eye exam Behavior change: setting realistic goals for initial changes Continue metformin 1,000 mg by mouth  twice daily meals Consider addition of GLP-1 agonist such as Trulicity 3.29 mg subcutaneously weekly and SGLT2i such as Jardiance or [place]. Patient  reports inability to afford but I can work with him to enroll in patient assistance programs with drug manufacturers. If addition of GLP-1 agonist +/- SGLT2i, recommend to discontinue glipizide XL 5 mg by mouth with breakfast Instructed to monitor blood sugars three times a day at the following times: fasting (at least 8 hours since last food consumption), 1-2 hours after breakfast, and bedtime Encouraged regular aerobic exercise with a goal of 30 minutes five times per week (150 minutes per week) Patient identified as a good candidate for a GLP-1 receptor agonist given reduction in cardiovascular disease, slowed chronic kidney disease progression, low risk of hypoglycemia, increased satiety , and weight loss. Patient denies a personal or family history of medullary thyroid carcinoma (MTC) or Multiple Endocrine Neoplasia syndrome type 2 (MEN 2). Patient also denies any history of pancreatitis or biliary disease. Patient identified as a good candidate for SGLT-2 inhibitor given reduction in cardiovascular disease, slowed chronic kidney disease progression, low risk of hypoglycemia, weight loss, and improved morbidity and mortality for heart failure with reduced ejection fraction. Patient denies a history of significant genitourinary infections. No concern for hypotension/volume depletion. GFR at least >20 mL/minute/1.73 m2.  Hypertension: Current medications: amlodipine 10 mg by mouth once daily and chlorthalidone 12.5 mg by mouth once daily Intolerances: angioedema with lisinopril Taking medications as directed: no, has not been taking chlorthalidone Side effects thought to be attributed to current medication regimen: no Denies dizziness, lightheadedness, blurred vision, and headache Home blood pressure readings: not checking but does have a machine Has not been  using CPAP machine x8 months Blood pressure under good control. Blood pressure is at goal of <130/80 mmHg per 2017 AHA/ACC  guidelines. Continue amlodipine 10 mg by mouth once daily Encourage dietary sodium restriction/DASH diet Recommend regular aerobic exercise Recommend home blood pressure monitoring, to bring results in next visit Discussed need for and importance of continued work on weight loss Discussed need for medication compliance Reviewed risks of hypertension, principles of treatment and consequences of untreated hypertension  CAD/Hyperlipidemia (Sees Cardiology - Dr. [name]): Current medications: atorvastatin 40 mg by mouth once daily (reduced from 56m due to elevated liver function tests) Intolerances: none Taking medications as directed: yes Side effects thought to be attributed to current medication regimen: no Uncontrolled; LDL above goal of <55 due to extreme risk given history of premature ASCVD (age <55 years, male; <65 years, male) per 2020 AACE/ACE guidelines and TG at goal of <150 per 2020 AACE/ACE guidelines Continue atorvastatin 40 mg by mouth once daily Consider addition of ezetimibe 10 mg by mouth once daily Recommend regular aerobic exercise Discussed need for and importance of continued work on weight loss Reviewed risks of hyperlipidemia, principles of treatment and consequences of untreated hyperlipidemia Discussed need for medication compliance Re-check lipid panel in 4-12 weeks  Overweight/Obesity Unable to achieve goal weight loss through lifestyle modification alone Current treatment: none  Medications/Strategies previously tried: none; Fasting/Modified Fasting Baseline weight: ~240 lbs; most recent weight: 208 lbs Extensive dietary counseling including education on focus on lean proteins, fruits and vegetables, whole grains and increased fiber consumption, adequate hydration Consider addition of GLP-1 agonist +/- SGLT2i as above Recommend diet modification to induce energy deficit of 500 kcal/day or greater Encouraged regular aerobic exercise with a goal of 30 minutes  five times per week (150 minutes per week)  Heart failure with reduced ejection fraction (LVEF <40%) (Sees Cardiology - Dr. BHarl Bowie: Suboptimally managed Current treatment: furosemide 40 mg daily as needed for fluid overload ACEi/ARB/ARNi contraindicated due to angioedema; beta blocker contraindicated due to history of sinus pause and bradycardia Reports inability to take potassium tablets due to size of pill. Reports has not taken x1 year. Potassium level normal. Consider potassium capsule instead of tablet if needed in future. Stage C (Symptomatic heart failure)/NYHA Class II (Slight limitation of physical activity. Comfortable at rest. Ordinary physical activity results in fatigue, palpitation, or dyspnea) Most recent echocardiogram was in 2016 with LVEF 40% Last BNP: 32.6 (2018) Current home blood pressure: not checking Current home weights: has not been checking recently because his battery died  Lower extremity edema: [x]  Yes, if on his feet every day but has furosemide PRN  []   No  []   Undetermined Jugular venous distention: []  Yes  []   No  [x]   Undetermined Ascites: []  Yes  []   No  [x]   Undetermined Denies dyspnea at rest or on exertion, unexplained fatigue, swelling in the legs, ankles and feet, swelling of the abdomen, very rapid weight gain from fluid buildup, nausea, vomiting, or lack of appetite, and chest pain. Reports reduced exercise capacity , persistent cough or wheezing, orthopnea, and paroxysmal nocturnal dyspnea Consider addition of SGLT2i +/- spironolactone given contraindication to other guideline directed medication therapy Encourage dietary sodium restriction (<3 g/day) Educated on the importance of weighing daily. Patient aware to contact cardiology/primary care team if weight gain >3 lbs in 1 day or >5 lbs in 1 week Discussed need for and importance of continued work on weight loss  Chronic Obstructive Pulmonary Disease/Asthma/Smoking Cessation (Sees Pulmonology -  Dr.  [name]): Uncontrolled Currently smokes 1 pack per day; not interested in smoking cessation today; has tried the nicotine patch previously Current treatment: tiotropium (Spiriva Respimat) 1 inhalation once daily and budesonide/formoterol (Symbicort HFA) 160-4.5 mcg 2 puffs by mouth twice daily Reports he has recently run out of Symbicort GOLD Classification: unknown MMRC/CAT score: unknown ACT Score: unknown Most recent Pulmonary Function Testing: 01/11/21 follows with Dr. [name] 0 exacerbations requiring treatment in the last 6 months  Current oxygen requirements: none Continue tiotropium (Spiriva Respimat) 1 inhalation once daily and budesonide/formoterol (Symbicort HFA) 160-4.5 mcg 2 puffs by mouth twice daily Discussed need for medication compliance Patient counseled on smoking cessation Reminded patient to rinse mouth with water and spit after using ICS containing inhaler to prevent thrush Recommend allergen avoidance (environmental control)  Patient Goals/Self-Care Activities Over the next 90  days, patient will:  Take medications as prescribed Focus on medication adherence by keeping up with prescription refills and either using a pill box or reminders to take your medications at the prescribed times Check blood sugar three times a day at the following times: fasting (at least 8 hours since last food consumption), 1-2 hours after breakfast, bedtime, and whenever patient experiences symptoms of hypo/hyperglycemia, document, and provide at future appointments Check blood pressure at least once daily, document, and provide at future appointments Weigh daily, and contact provider if weight gain of more than 3 lbs in 1 day or more than 5 lbs in 1 week Collaborate with provider on medication access solutions Target a minimum of 150 minutes of moderate intensity exercise weekly Engage in dietary modifications by fewer sweetened foods & beverages, better food choices, and watch portion  sizes/amount of food eaten at one time  Follow Up Plan: Telephone follow up appointment with care management team member scheduled for: 3 weeks Next PCP appointment scheduled for: 1 week

## 2021-04-18 NOTE — Chronic Care Management (AMB) (Signed)
Chronic Care Management Pharmacy Note  04/18/2021 Name:  Randy Hebert MRN:  [id number] DOB:  Sep 21, 1965  Recommendations made from today's visit: Diabetes:  Patient's insurance does not cover Mounjaro, consider addition of GLP-1 agonist such as Trulicity 5.03 mg subcutaneously weekly instead. Titrate dose if tolerated.  If addition of GLP-1 agonist, recommend to discontinue glipizide XL 5 mg by mouth with breakfast CAD/hyperlipidemia: Consider addition of ezetimibe 10 mg by mouth once daily given LDL above goal on maximally tolerated statin  CHF: Consider addition of SGLT2i (Jardiance or [place]) +/- spironolactone given contraindication to other guideline directed medication therapy  Other: Patient reports inability to afford brand medications but I can work with him to enroll in patient assistance programs with drug manufacturers.  Subjective: Randy Hebert is an 55 y.o. year old male who is a primary patient of Noreene Larsson, NP.  The CCM team was consulted for assistance with disease management and care coordination needs.    Engaged with patient by telephone for initial visit in response to provider referral for pharmacy case management and/or care coordination services.   Consent to Services:  The patient was given the following information about Chronic Care Management services today, agreed to services, and gave verbal consent: 1. CCM service includes personalized support from designated clinical staff supervised by the primary care provider, including individualized plan of care and coordination with other care providers 2. 24/7 contact phone numbers for assistance for urgent and routine care needs. 3. Service will only be billed when office clinical staff spend 20 minutes or more in a month to coordinate care. 4. Only one practitioner may furnish and bill the service in a calendar month. 5.The patient may stop CCM services at any time (effective at the end of the month)  by phone call to the office staff. 6. The patient will be responsible for cost sharing (co-pay) of up to 20% of the service fee (after annual deductible is met). Patient agreed to services and consent obtained.  Patient Care Team: Noreene Larsson, NP as PCP - General (Nurse Practitioner) Evans Lance, MD as PCP - Electrophysiology (Cardiology) Arnoldo Lenis, MD as PCP - Cardiology (Cardiology) Beryle Lathe, [facility] (Pharmacist)  Objective:  Lab Results  Component Value Date   CREATININE 0.70 (L) 04/11/2021   CREATININE 0.64 (L) 10/24/2020   CREATININE 0.62 (L) 10/11/2020    Lab Results  Component Value Date   HGBA1C 9.0 (H) 04/11/2021   Last diabetic Eye exam:  Lab Results  Component Value Date/Time   HMDIABEYEEXA No Retinopathy 02/16/2020 12:00 AM    Last diabetic Foot exam: No results found for: HMDIABFOOTEX      Component Value Date/Time   CHOL 124 04/11/2021 0819   TRIG 118 04/11/2021 0819   HDL 28 (L) 04/11/2021 0819   CHOLHDL 7.1 (H) 06/21/2020 0707   VLDL 41 (H) 04/27/2017 0505   LDLCALC 74 04/11/2021 0819   LDLCALC 116 (H) 06/21/2020 0707    Hepatic Function Latest Ref Rng & Units 04/11/2021 10/24/2020 10/11/2020  Total Protein 6.0 - 8.5 g/dL 7.0 7.3 7.4  Albumin 3.8 - 4.9 g/dL 4.2 4.2 4.4  AST 0 - 40 IU/L 28 28 48(H)  ALT 0 - 44 IU/L 33 20 30  Alk Phosphatase 44 - 121 IU/L 145(H) 164(H) 169(H)  Total Bilirubin 0.0 - 1.2 mg/dL 0.9 1.0 1.0  Bilirubin, Direct 0.0 - 0.2 mg/dL - - -    Lab Results  Component Value  Date/Time   TSH 3.61 06/21/2020 07:07 AM   TSH 1.58 11/03/2018 07:05 AM   FREET4 1.5 11/03/2018 07:05 AM   FREET4 1.4 11/17/2017 07:09 AM    CBC Latest Ref Rng & Units 04/11/2021 10/24/2020 06/21/2020  WBC 3.4 - 10.8 x10E3/uL 7.2 6.0 7.5  Hemoglobin 13.0 - 17.7 g/dL 15.0 11.8(L) 15.2  Hematocrit 37.5 - 51.0 % 44.4 39.1 45.6  Platelets 150 - 450 x10E3/uL 180 296 228    Lab Results  Component Value Date/Time   VD25OH 36 06/21/2020  07:07 AM   VD25OH 31 11/09/2019 07:14 AM    Clinical ASCVD: Yes  The ASCVD Risk score Mikey Bussing DC Jr., et al., 2013) failed to calculate for the following reasons:   The valid total cholesterol range is 130 to 320 mg/dL    Social History   Tobacco Use  Smoking Status Every Day   Packs/day: 1.00   Years: 46.00   Pack years: 46.00   Types: Cigarettes  Smokeless Tobacco Former   Types: Chew   BP Readings from Last 3 Encounters:  04/17/21 120/70  04/02/21 121/71  01/11/21 140/82   Pulse Readings from Last 3 Encounters:  04/17/21 90  04/02/21 87  01/11/21 92   Wt Readings from Last 3 Encounters:  04/17/21 208 lb (94.3 kg)  04/02/21 206 lb (93.4 kg)  01/11/21 194 lb 6.4 oz (88.2 kg)    Assessment: Review of patient past medical history, allergies, medications, health status, including review of consultants reports, laboratory and other test data, was performed as part of comprehensive evaluation and provision of chronic care management services.   SDOH:  (Social Determinants of Health) assessments and interventions performed:    CCM Care Plan  Allergies  Allergen Reactions   Carvedilol Other (See Comments)    Sinus pause on telemetry >3 seconds. Longest one 9 sec. No AV nodal agent   Lisinopril Anaphylaxis, Shortness Of Breath and Swelling    Angioedema, required intubation and mechanical ventilation   Amoxicillin Nausea And Vomiting and Nausea Only    Nausea only - no allergy  Did it involve swelling of the face/tongue/throat, SOB, or low BP? No Did it involve sudden or severe rash/hives, skin peeling, or any reaction on the inside of your mouth or nose? No Did you need to seek medical attention at a hospital or doctor's office? No When did it last happen? childhood reaction      If all above answers are "NO", may proceed with cephalosporin use.     Medications Reviewed Today     Reviewed by Beryle Lathe, [facility] (Pharmacist) on 04/18/21 at [place]  Status: <None>   Medication Order Taking? Sig Documenting Provider Last Dose Status Informant  acetaminophen (TYLENOL) 500 MG tablet [id number] Yes Take 1,000 mg by mouth every 4 (four) hours as needed for mild pain or fever.  [provider] Taking Active Self  albuterol (PROVENTIL HFA) 108 (90 Base) MCG/ACT inhaler [id number] Yes INHALE 2 PUFFS INTO THE LUNGS EVERY 6 HOURS AS NEEDED FOR SHORTNESS OF BREATH OR WHEEZING. Dara Hoyer, FNP Taking Active            Med Note Waldo Laine, Gwenyth Allegra   Thu Apr 18, 2021  9:15 AM) Uses once every morning   albuterol (PROVENTIL) (2.5 MG/3ML) 0.083% nebulizer solution [id number] Yes Take 3 mLs (2.5 mg total) by nebulization every 6 (six) hours as needed for wheezing or shortness of breath. Soyla Dryer, PA-C Taking Active Self  Med Note Jim Like Apr 18, 2021  9:14 AM) Has used 5-6 times in last month  amLODipine (NORVASC) 10 MG tablet [id number] Yes TAKE 1 TABLET BY MOUTH ONCE A DAY. Noreene Larsson, NP Taking Active   ascorbic acid (VITAMIN C) 500 MG tablet [id number] Yes Take 1 tablet (500 mg total) by mouth daily. Barton Dubois, MD Taking Active   aspirin 81 MG EC tablet [id number] Yes Take 1 tablet (81 mg total) by mouth daily.  Patient taking differently: Take 81 mg by mouth in the morning and at bedtime.   Cheryln Manly, NP Taking Active Self  atorvastatin (LIPITOR) 40 MG tablet [id number] Yes Take 1 tablet (40 mg total) by mouth every evening. Perlie Mayo, NP Taking Active   azelastine (ASTELIN) 0.1 % nasal spray [id number] Yes USE 2 SPRAYS IN EACH NOSTRIL TWICE DAILY. Valentina Shaggy, MD Taking Active   budesonide-formoterol [facility]) 160-4.5 MCG/ACT inhaler [id number] No Inhale 2 puffs into the lungs 2 (two) times daily.  Patient not taking: Reported on 04/18/2021   Dara Hoyer, FNP Not Taking Active            Med Note Jim Like Apr 18, 2021  9:17 AM) Pt reports has been out  of this x1 month   chlorthalidone (HYGROTON) 25 MG tablet [id number] No Take 0.5 tablets (12.5 mg total) by mouth daily.  Patient not taking: Reported on 04/18/2021   Arnoldo Lenis, MD Not Taking Expired 04/17/21 2359   Cholecalciferol (VITAMIN D3) 125 MCG (5000 UT) CAPS [id number] No Take 1 capsule (5,000 Units total) by mouth daily.  Patient not taking: Reported on 04/18/2021   Cassandria Anger, MD Not Taking Active   citalopram (CELEXA) 20 MG tablet [id number] Yes Take 20 mg by mouth daily. [provider] Taking Active Self  cycloSPORINE (RESTASIS) 0.05 % ophthalmic emulsion [id number] Yes Place 1 drop into both eyes 2 (two) times daily. [provider] Taking Active Self  ferrous sulfate 325 (65 FE) MG tablet [id number] Yes Take 1 tablet (325 mg total) by mouth daily with breakfast. Noreene Larsson, NP Taking Active   furosemide (LASIX) 40 MG tablet [id number] Yes Take 1 tablet (40 mg total) by mouth daily as needed for fluid. Evans Lance, MD Taking Active   glipiZIDE (GLUCOTROL XL) 5 MG 24 hr tablet [id number] Yes TAKE 1 TABLET BY MOUTH DAILY WITH BREAKFAST. Cassandria Anger, MD Taking Active   glucose blood (ACCU-CHEK AVIVA PLUS) test strip [id number] Yes Use as instructed to check blood glucose once daily, DX E11.59 Cassandria Anger, MD Taking Active   magnesium gluconate (MAGONATE) 500 MG tablet [id number] No Take 500 mg by mouth 2 (two) times daily.  Patient not taking: Reported on 04/18/2021   [provider] Not Taking Active   metFORMIN (GLUCOPHAGE) 1000 MG tablet [id number] Yes TAKE 1 TABLET BY MOUTH TWICE DAILY WITH MEALS. Cassandria Anger, MD Taking Active   mometasone (ELOCON) 0.1 % cream [id number] Yes Apply 1 application topically 2 (two) times a week.  [provider] Taking Active Self  nitroGLYCERIN (NITROSTAT) 0.4 MG SL tablet [id number] No Place 1 tablet (0.4 mg total) under the tongue every 5 (five) minutes as needed for  chest pain.  Patient not taking: Reported on 04/18/2021   Evans Lance, MD Not Taking Active   olopatadine (PATANOL) 0.1 % ophthalmic solution [id number] Yes 1 drop 2 (two) times daily. [provider] Taking Active   pantoprazole (PROTONIX) 40 MG tablet [id number] Yes Take 1 tablet (40 mg total) by mouth 2 (two) times daily before a meal. Annitta Needs, NP Taking Active   potassium chloride SA (KLOR-CON) 20 MEQ tablet [id number] No Take 2 tablets (40 mEq total) by mouth daily.  Patient not taking: Reported on 04/18/2021   Arnoldo Lenis, MD Not Taking Expired 04/17/21 2359   prednisoLONE acetate (PRED FORTE) 1 % ophthalmic suspension [id number] Yes Place 4 drops into both eyes 2 (two) times a week.  [provider] Taking Active Self  Tiotropium Bromide Monohydrate (SPIRIVA RESPIMAT) 2.5 MCG/ACT AERS [id number] Yes Inhale 1 puff into the lungs daily. Dara Hoyer, FNP Taking Active   tirzepatide [facility]) 2.5 MG/0.5ML Pen [id number] No Inject 2.5 mg into the skin once a week for 28 days.  Patient not taking: Reported on 04/18/2021   Noreene Larsson, NP Not Taking Active   tirzepatide [facility]) 5 MG/0.5ML Pen [id number] No Inject 5 mg into the skin once a week for 28 days.  Patient not taking: Reported on 04/18/2021   Noreene Larsson, NP Not Taking Active   triamcinolone ointment (KENALOG) 0.1 % [id number] Yes Apply 1 application topically 2 (two) times daily. Valentina Shaggy, MD Taking Active             Patient Active Problem List   Diagnosis Date Noted   IDA (iron deficiency anemia) 04/02/2021   Allergic reaction 01/09/2021   Need for immunization against influenza 06/20/2020   Annual visit for general adult medical examination with abnormal findings 06/20/2020   Encounter for screening for malignant neoplasm of prostate 06/20/2020   Seasonal and perennial allergic rhinitis 11/10/2018   Restrictive lung disease 11/10/2018   Cirrhosis of liver ([place]) 11/08/2018    Barrett's esophagus 08/04/2018   Vitamin D deficiency 06/30/2018   Alcohol abuse 06/30/2018   DM type 2 causing vascular disease ([place]) 08/17/2017   Chronic combined systolic and diastolic CHF (congestive heart failure) ([place]) 05/15/2017   Sleep apnea 04/28/2017   Depression with anxiety 04/15/2017   GERD (gastroesophageal reflux disease) 09/23/2016   Asthma-COPD overlap syndrome ([place]) 08/27/2015   Cigarette nicotine dependence with nicotine-induced disorder 08/27/2015   Mixed hyperlipidemia 08/27/2015   Class 1 obesity due to excess calories without serious comorbidity with body mass index (BMI) of 34.0 to 34.9 in adult 41/93/7902   Chronic systolic CHF (congestive heart failure) ([place]) 06/01/2015   CAD (coronary artery disease) 05/31/2015   Essential hypertension, benign 02/09/2015    Immunization History  Administered Date(s) Administered   Influenza,inj,Quad PF,6+ Mos 07/03/2016, 05/07/2017, 06/20/2020   Influenza-Unspecified 05/17/2018   Moderna Sars-Covid-2 Vaccination 02/20/2020   Pneumococcal Conjugate-13 06/17/2017   Pneumococcal Polysaccharide-23 02/07/2015   Tdap 05/17/2019    Conditions to be addressed/monitored: CHF, CAD, HTN, HLD, COPD, and DMII  Care Plan : Medication Management  Updates made by Beryle Lathe, [place] since 04/18/2021 12:00 AM     Problem: Diabetes, Hypertension, Coronar Artery Disease, Hyperlipidemia, COPD/Asthma, Smoking Cessation, Weight Management   Priority: High  Onset Date: 04/18/2021     Long-Range Goal: Disease Progression Prevention   Start Date: 04/18/2021  Expected End Date: 07/17/2021  This Visit's Progress: On track  Priority: High  Note:   Current Barriers:  Unable to achieve control of diabetes  Pharmacist Clinical Goal(s):  Over the next 90  days, patient will Achieve control of diabetes as evidenced by improved A1c through collaboration with PharmD and  provider.   Interventions: 1:1 collaboration with Noreene Larsson,  NP regarding development and update of comprehensive plan of care as evidenced by provider attestation and co-signature Inter-disciplinary care team collaboration (see longitudinal plan of care) Comprehensive medication review performed; medication list updated in electronic medical record  Type 2 Diabetes (Sees Endocrinology - Dr. Dorris Fetch): Current medications: metformin 1,000 mg by mouth  twice daily meals and glipizide XL 5 mg by mouth with breakfast Patient's insurance does not cover Mounjaro and since he has Medicare, unable to use co-pay card. Will remove from medication list. Intolerances: none Taking medications as directed: yes Side effects thought to be attributed to current medication regimen: no Denies recent hypoglycemic/hyperglycemic symptoms although did report frequent urination when he was drinking a lot of strawberry/peach drinks and slushies a few weeks ago Hypoglycemia prevention: not indicated at this time Current meal patterns: breakfast: cereal, banana sandwich, tasted ham/cheese, and biscuit; lunch: burger and corn dog; dinner: pizza, sub, and stew; snacks: chips and crackers; drinks: water and coffee with sugar Current exercise: goes to [facility] but has not been x1.5 months On a statin: $RemoveB'[x]'qtFgneMB$  Yes  $Re'[]'QKm$  No    Last microalbumin: 1.8 (11/09/19); on an ACEi/ARB: $RemoveBefo'[]'RxpoAsGagwM$  Yes  $Re'[x]'JDd$  No, contraindicated due to angioedema Last eye exam: Feb/March 2021 per patient; next scheduled Oct 2022 Last foot exam: unknown Pneumonia vaccine: up to date Current glucose readings: fasting blood glucose: above goal of 80-130 mg/dL per ADA guidelines; BG today and yesterday 211 and 176 (fasting) Uncontrolled; Most recent A1c above goal of <7% per ADA guidelines Introduction to diabetes basic facts Medication: Identify diabetes medication and when best taken Monitoring: target blood sugar range, when to test Signs and symptoms of hyperglycemia Hypoglycemia: I have discussed with the patient how to treat  hypoglycemia by the rule of 15; eat/drink 15g of sugar in the form of glucose tabs, 4 ounces of juice or soda and recheck fingerstick glucose in 15 minutes. Chocolate bars and ice cream should be avoided because fat delays carbohydrate digestion and absorption. Retreat if glucose remains low. Driving should cease until glucose is normal. Need for daily foot inspection and annual eye exam Behavior change: setting realistic goals for initial changes Continue metformin 1,000 mg by mouth  twice daily meals Consider addition of GLP-1 agonist such as Trulicity 7.34 mg subcutaneously weekly and SGLT2i such as Jardiance or [place]. Patient reports inability to afford but I can work with him to enroll in patient assistance programs with drug manufacturers. If addition of GLP-1 agonist +/- SGLT2i, recommend to discontinue glipizide XL 5 mg by mouth with breakfast Instructed to monitor blood sugars three times a day at the following times: fasting (at least 8 hours since last food consumption), 1-2 hours after breakfast, and bedtime Encouraged regular aerobic exercise with a goal of 30 minutes five times per week (150 minutes per week) Patient identified as a good candidate for a GLP-1 receptor agonist given reduction in cardiovascular disease, slowed chronic kidney disease progression, low risk of hypoglycemia, increased satiety , and weight loss. Patient denies a personal or family history of medullary thyroid carcinoma (MTC) or Multiple Endocrine Neoplasia syndrome type 2 (MEN 2). Patient also denies any history of pancreatitis or biliary disease. Patient identified as a good candidate for SGLT-2 inhibitor given reduction in cardiovascular disease, slowed chronic kidney disease progression, low risk of hypoglycemia, weight loss, and improved morbidity and mortality for heart failure with reduced ejection fraction. Patient denies a history of significant genitourinary infections. No  concern for hypotension/volume  depletion. GFR at least >20 mL/minute/1.73 m2.  Hypertension: Current medications: amlodipine 10 mg by mouth once daily and chlorthalidone 12.5 mg by mouth once daily Intolerances: angioedema with lisinopril Taking medications as directed: no, has not been taking chlorthalidone Side effects thought to be attributed to current medication regimen: no Denies dizziness, lightheadedness, blurred vision, and headache Home blood pressure readings: not checking but does have a machine Has not been using CPAP machine x8 months Blood pressure under good control. Blood pressure is at goal of <130/80 mmHg per 2017 AHA/ACC guidelines. Continue amlodipine 10 mg by mouth once daily Encourage dietary sodium restriction/DASH diet Recommend regular aerobic exercise Recommend home blood pressure monitoring, to bring results in next visit Discussed need for and importance of continued work on weight loss Discussed need for medication compliance Reviewed risks of hypertension, principles of treatment and consequences of untreated hypertension  CAD/Hyperlipidemia (Sees Cardiology - Dr. [name]): Current medications: atorvastatin 40 mg by mouth once daily (reduced from 80mg  due to elevated liver function tests) Intolerances: none Taking medications as directed: yes Side effects thought to be attributed to current medication regimen: no Uncontrolled; LDL above goal of <55 due to extreme risk given history of premature ASCVD (age <55 years, male; <65 years, male) per 2020 AACE/ACE guidelines and TG at goal of <150 per 2020 AACE/ACE guidelines Continue atorvastatin 40 mg by mouth once daily Consider addition of ezetimibe 10 mg by mouth once daily Recommend regular aerobic exercise Discussed need for and importance of continued work on weight loss Reviewed risks of hyperlipidemia, principles of treatment and consequences of untreated hyperlipidemia Discussed need for medication compliance Re-check lipid panel  in 4-12 weeks  Overweight/Obesity Unable to achieve goal weight loss through lifestyle modification alone Current treatment: none  Medications/Strategies previously tried: none; Fasting/Modified Fasting Baseline weight: ~240 lbs; most recent weight: 208 lbs Extensive dietary counseling including education on focus on lean proteins, fruits and vegetables, whole grains and increased fiber consumption, adequate hydration Consider addition of GLP-1 agonist +/- SGLT2i as above Recommend diet modification to induce energy deficit of 500 kcal/day or greater Encouraged regular aerobic exercise with a goal of 30 minutes five times per week (150 minutes per week)  Heart failure with reduced ejection fraction (LVEF <40%) (Sees Cardiology - Dr. [name]): Suboptimally managed Current treatment: furosemide 40 mg daily as needed for fluid overload ACEi/ARB/ARNi contraindicated due to angioedema; beta blocker contraindicated due to history of sinus pause and bradycardia Reports inability to take potassium tablets due to size of pill. Reports has not taken x1 year. Potassium level normal. Consider potassium capsule instead of tablet if needed in future. Stage C (Symptomatic heart failure)/NYHA Class II (Slight limitation of physical activity. Comfortable at rest. Ordinary physical activity results in fatigue, palpitation, or dyspnea) Most recent echocardiogram was in 2016 with LVEF 40% Last BNP: 32.6 (2018) Current home blood pressure: not checking Current home weights: has not been checking recently because his battery died  Lower extremity edema: [x]  Yes, if on his feet every day but has furosemide PRN  []   No  []   Undetermined Jugular venous distention: []  Yes  []   No  [x]   Undetermined Ascites: []  Yes  []   No  [x]   Undetermined Denies dyspnea at rest or on exertion, unexplained fatigue, swelling in the legs, ankles and feet, swelling of the abdomen, very rapid weight gain from fluid buildup, nausea,  vomiting, or lack of appetite, and chest pain. Reports reduced exercise capacity , persistent  cough or wheezing, orthopnea, and paroxysmal nocturnal dyspnea Consider addition of SGLT2i +/- spironolactone given contraindication to other guideline directed medication therapy Encourage dietary sodium restriction (<3 g/day) Educated on the importance of weighing daily. Patient aware to contact cardiology/primary care team if weight gain >3 lbs in 1 day or >5 lbs in 1 week Discussed need for and importance of continued work on weight loss  Chronic Obstructive Pulmonary Disease/Asthma/Smoking Cessation (Sees Pulmonology - Dr. [name]): Uncontrolled Currently smokes 1 pack per day; not interested in smoking cessation today; has tried the nicotine patch previously Current treatment: tiotropium (Spiriva Respimat) 1 inhalation once daily and budesonide/formoterol (Symbicort HFA) 160-4.5 mcg 2 puffs by mouth twice daily Reports he has recently run out of Symbicort GOLD Classification: unknown MMRC/CAT score: unknown ACT Score: unknown Most recent Pulmonary Function Testing: 01/11/21 follows with Dr. [name] 0 exacerbations requiring treatment in the last 6 months  Current oxygen requirements: none Continue tiotropium (Spiriva Respimat) 1 inhalation once daily and budesonide/formoterol (Symbicort HFA) 160-4.5 mcg 2 puffs by mouth twice daily Discussed need for medication compliance Patient counseled on smoking cessation Reminded patient to rinse mouth with water and spit after using ICS containing inhaler to prevent thrush Recommend allergen avoidance (environmental control)  Patient Goals/Self-Care Activities Over the next 90  days, patient will:  Take medications as prescribed Focus on medication adherence by keeping up with prescription refills and either using a pill box or reminders to take your medications at the prescribed times Check blood sugar three times a day at the following times:  fasting (at least 8 hours since last food consumption), 1-2 hours after breakfast, bedtime, and whenever patient experiences symptoms of hypo/hyperglycemia, document, and provide at future appointments Check blood pressure at least once daily, document, and provide at future appointments Weigh daily, and contact provider if weight gain of more than 3 lbs in 1 day or more than 5 lbs in 1 week Collaborate with provider on medication access solutions Target a minimum of 150 minutes of moderate intensity exercise weekly Engage in dietary modifications by fewer sweetened foods & beverages, better food choices, and watch portion sizes/amount of food eaten at one time  Follow Up Plan: Telephone follow up appointment with care management team member scheduled for: 3 weeks Next PCP appointment scheduled for: 1 week     Medication Assistance:  N/A  Patient's preferred pharmacy is:  [place] [place] [address] [place] [address] Phone: [phone] Fax: [phone]  Follow Up:  Patient agrees to Care Plan and Follow-up.  Kennon Holter, PharmD Clinical Pharmacist [facility] [phone]

## 2021-04-20 ENCOUNTER — Other Ambulatory Visit: Payer: Self-pay | Admitting: Allergy & Immunology

## 2021-04-23 ENCOUNTER — Encounter: Payer: Self-pay | Admitting: Gastroenterology

## 2021-04-23 ENCOUNTER — Other Ambulatory Visit: Payer: Self-pay

## 2021-04-23 ENCOUNTER — Ambulatory Visit (INDEPENDENT_AMBULATORY_CARE_PROVIDER_SITE_OTHER): Payer: Medicare Other | Admitting: Gastroenterology

## 2021-04-23 VITALS — BP 134/79 | HR 85 | Temp 97.7°F | Ht 65.0 in | Wt 205.4 lb

## 2021-04-23 DIAGNOSIS — K7460 Unspecified cirrhosis of liver: Secondary | ICD-10-CM | POA: Diagnosis not present

## 2021-04-23 DIAGNOSIS — K7030 Alcoholic cirrhosis of liver without ascites: Secondary | ICD-10-CM

## 2021-04-23 NOTE — Patient Instructions (Signed)
Please have blood work done when you are able.   Please keep appointment for upcoming ultrasound of the liver.  It is important to avoid alcohol all together. I am glad you are have started making good changes!  We will see you in 6 months and arrange colonoscopy/endoscopy at that time.  I enjoyed seeing you again today! As you know, I value our relationship and want to provide genuine, compassionate, and quality care. I welcome your feedback. If you receive a survey regarding your visit,  I greatly appreciate you taking time to fill this out. See you next time!  Annitta Needs, PhD, ANP-BC [facility]

## 2021-04-23 NOTE — Progress Notes (Signed)
Referring Provider: Perlie Mayo, NP Primary Care Physician:  Noreene Larsson, NP Primary GI: Dr. Gala Romney  Chief Complaint  Patient presents with   Gastroesophageal Reflux    Occ if he eats something spicy   fatty liver    HPI:   Randy Hebert is a 55 y.o. male presenting today with a history of  Barrett's, fatty liver historically and now imaging with cirrhosis(with negative Hep C antibody, negative Hep B surface antigen, negative hemochromatosis screen), adenomas with surveillance due in 2023. Recent EGD completed March 2021 with Barrett's and portal gastropathy. Surveillance due in 2023.  Most recent US Feb 2022 with subtle nodularity of hepatic contours, multiple nonshadowing structures in gallbladder lumne could be sludge or polyps, with the largest measuring 7 mm. He is due for [place] again this month, which is upcoming.    Has gained weight. Left the whiskey alone. 2 beers last night. Says the "alcohol is going away". Protonix BID. Recent A1c 9. Cut down on drinking. No abdominal pain. No overt GI bleeding. GERD controlled on Protonix BID. No mental status changes or confusion.    No rectal bleeding.  Past Medical History:  Diagnosis Date   ACE inhibitor-aggravated angioedema    Allergy    Angio-edema    Anxiety    Arthritis    Asthma    hip replacement   Back pain    Bradycardia 04/28/2017   Bulging of cervical intervertebral disc    CAD (coronary artery disease)    lateral STEMI 02/06/2015 00% D1 occlusion treated with Promus Premier 2.5 mm x 16 mm DES, 70% ramus stenosis, 40% mid RCA stenosis, 45% distal RCA stenosis, EF 45-50%   CHF (congestive heart failure) (HCC)    COPD (chronic obstructive pulmonary disease) ([place])    Depression    Diabetes mellitus without complication ([place])    Difficult intubation    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   Dry eye    Dyspnea    Early satiety 09/23/2016   GERD (gastroesophageal reflux disease)    HCAP  (healthcare-associated pneumonia) 05/15/2017   Headache    Heart murmur    Hip pain    Hyperlipidemia    Hypertension    Lobar pneumonia ([place]) 05/15/2017   Melena 08/04/2018   MI (myocardial infarction) ([place])    Myocardial infarction ([place])    Neck pain    Non-ST elevation (NSTEMI) myocardial infarction ([place]) 04/27/2017   NSTEMI (non-ST elevated myocardial infarction) ([place]) 04/26/2017   Otitis media    Pleurisy    Pneumonia due to COVID-19 virus 10/07/2019   Rectal bleeding 11/08/2018   Right shoulder pain 03/20/2020   Sinus pause    9 sec sinus pause on telemetry after started on coreg after MI, avoid AV nodal blocking agent   Sleep apnea    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   STEMI (ST elevation myocardial infarction) ([place]) 05/31/2015   Substance abuse ([place]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   Syncope 83/38/2505   Syncope and collapse 05/15/2017   Transaminitis 08/04/2018   Unilateral primary osteoarthritis, right hip 07/01/2016    Past Surgical History:  Procedure Laterality Date   BIOPSY  10/09/2016   Procedure: BIOPSY;  Surgeon: Daneil Dolin, MD;  [facility];  Service: Endoscopy;;   BIOPSY  11/28/2019   Procedure: BIOPSY;  Surgeon: Daneil Dolin, MD;  [facility];  Service: Endoscopy;;   CARDIAC CATHETERIZATION N/A 02/06/2015  Procedure: Left Heart Cath and Coronary Angiography;  Surgeon: Leonie Man, MD;  Location: [facility];  Service: Cardiovascular;  Laterality: N/A;   CARDIAC CATHETERIZATION N/A 02/06/2015   Procedure: Coronary Stent Intervention;  Surgeon: Leonie Man, MD;  Location: [facility];  Service: Cardiovascular;  Laterality: N/A;   COLONOSCOPY WITH PROPOFOL N/A 10/09/2016   Sigmoid and descending colon diverticulosis, four 4-6 mm polyps in sigmoid, one 4 mm polyp in descending. Tubular adenomas and hyperplastic. 5 year surveillance.    COLONOSCOPY WITH PROPOFOL N/A 11/24/2016   Sigmoid and descending colon diverticulosis,  four 4-6 mm polyps in sigmoid, one 4 mm polyp in descending. Tubular adenomas and hyperplastic. 5 year surveillance.    CORONARY ANGIOPLASTY WITH STENT PLACEMENT  01/2015   CORONARY STENT INTERVENTION N/A 04/27/2017   Procedure: CORONARY STENT INTERVENTION;  Surgeon: Nelva Bush, MD;  Location: [facility];  Service: Cardiovascular;  Laterality: N/A;   ELECTROPHYSIOLOGY STUDY N/A 06/29/2017   Procedure: ELECTROPHYSIOLOGY STUDY;  Surgeon: Evans Lance, MD;  Location: [facility];  Service: Cardiovascular;  Laterality: N/A;   ESOPHAGOGASTRODUODENOSCOPY (EGD) WITH PROPOFOL N/A 10/09/2016   Dr. Gala Romney: LA grade a esophagitis.  Barrett's esophagus, biopsy-proven.  Small hiatal hernia.  EGD February 2019   ESOPHAGOGASTRODUODENOSCOPY (EGD) WITH PROPOFOL N/A 11/28/2019    salmon-colored esophageal mucosa (Barrett's) small hiatal hernia, portal hypertensive gastropathy, normal duodenum, 3 year surveillance   INCISION / DRAINAGE HAND / FINGER     LEFT HEART CATH AND CORONARY ANGIOGRAPHY N/A 04/27/2017   Procedure: LEFT HEART CATH AND CORONARY ANGIOGRAPHY;  Surgeon: Nelva Bush, MD;  Location: [facility];  Service: Cardiovascular;  Laterality: N/A;   LOOP RECORDER INSERTION  06/29/2017   Procedure: Loop Recorder Insertion;  Surgeon: Evans Lance, MD;  Location: [facility];  Service: Cardiovascular;;   POLYPECTOMY  11/24/2016   Procedure: POLYPECTOMY;  Surgeon: Daneil Dolin, MD;  [facility];  Service: Endoscopy;;  descending and sigmoid   TOTAL HIP ARTHROPLASTY Right 07/01/2016   TOTAL HIP ARTHROPLASTY Right 07/01/2016   Procedure: RIGHT TOTAL HIP ARTHROPLASTY ANTERIOR APPROACH;  Surgeon: Mcarthur Rossetti, MD;  Location: [facility];  Service: Orthopedics;  Laterality: Right;    Current Outpatient Medications  Medication Sig Dispense Refill   acetaminophen (TYLENOL) 500 MG tablet Take 1,000 mg by mouth every 4 (four) hours as needed for mild pain or  fever.      albuterol (PROVENTIL HFA) 108 (90 Base) MCG/ACT inhaler INHALE 2 PUFFS INTO THE LUNGS EVERY 6 HOURS AS NEEDED FOR SHORTNESS OF BREATH OR WHEEZING. 18 g 1   albuterol (PROVENTIL) (2.5 MG/3ML) 0.083% nebulizer solution Take 3 mLs (2.5 mg total) by nebulization every 6 (six) hours as needed for wheezing or shortness of breath. 150 mL 1   amLODipine (NORVASC) 10 MG tablet TAKE 1 TABLET BY MOUTH ONCE A DAY. 90 tablet 0   ascorbic acid (VITAMIN C) 500 MG tablet Take 1 tablet (500 mg total) by mouth daily. 30 tablet 1   aspirin 81 MG EC tablet Take 1 tablet (81 mg total) by mouth daily. (Patient taking differently: Take 81 mg by mouth in the morning and at bedtime.) 30 tablet    atorvastatin (LIPITOR) 40 MG tablet Take 1 tablet (40 mg total) by mouth every evening. 30 tablet 6   Azelastine HCl 137 MCG/SPRAY SOLN USE 2 SPRAYS IN EACH NOSTRIL TWICE DAILY. 30 mL 0   budesonide-formoterol (SYMBICORT)  160-4.5 MCG/ACT inhaler Inhale 2 puffs into the lungs 2 (two) times daily. 10.2 g 5   Cholecalciferol (VITAMIN D3) 125 MCG (5000 UT) CAPS Take 1 capsule (5,000 Units total) by mouth daily. 90 capsule 0   citalopram (CELEXA) 20 MG tablet Take 20 mg by mouth daily.     cycloSPORINE (RESTASIS) 0.05 % ophthalmic emulsion Place 1 drop into both eyes 2 (two) times daily.     ferrous sulfate 325 (65 FE) MG tablet Take 1 tablet (325 mg total) by mouth daily with breakfast. 90 tablet 1   furosemide (LASIX) 40 MG tablet Take 1 tablet (40 mg total) by mouth daily as needed for fluid. 30 tablet 11   glipiZIDE (GLUCOTROL XL) 5 MG 24 hr tablet TAKE 1 TABLET BY MOUTH DAILY WITH BREAKFAST. 90 tablet 0   glucose blood (ACCU-CHEK AVIVA PLUS) test strip Use as instructed to check blood glucose once daily, DX E11.59 100 each 12   magnesium gluconate (MAGONATE) 500 MG tablet Take 500 mg by mouth 2 (two) times daily.     metFORMIN (GLUCOPHAGE) 1000 MG tablet TAKE 1 TABLET BY MOUTH TWICE DAILY WITH MEALS. 180 tablet 0    mometasone (ELOCON) 0.1 % cream Apply 1 application topically 2 (two) times a week.      nitroGLYCERIN (NITROSTAT) 0.4 MG SL tablet Place 1 tablet (0.4 mg total) under the tongue every 5 (five) minutes as needed for chest pain. 25 tablet 3   olopatadine (PATANOL) 0.1 % ophthalmic solution 1 drop 2 (two) times daily.     pantoprazole (PROTONIX) 40 MG tablet Take 1 tablet (40 mg total) by mouth 2 (two) times daily before a meal. 60 tablet 3   prednisoLONE acetate (PRED FORTE) 1 % ophthalmic suspension Place 4 drops into both eyes 2 (two) times a week.      Tiotropium Bromide Monohydrate (SPIRIVA RESPIMAT) 2.5 MCG/ACT AERS Inhale 1 puff into the lungs daily. 4 g 5   triamcinolone ointment (KENALOG) 0.1 % Apply 1 application topically 2 (two) times daily. 454 g 1   chlorthalidone (HYGROTON) 25 MG tablet Take 0.5 tablets (12.5 mg total) by mouth daily. (Patient not taking: No sig reported) 45 tablet 1   potassium chloride SA (KLOR-CON) 20 MEQ tablet Take 2 tablets (40 mEq total) by mouth daily. (Patient not taking: Reported on 04/18/2021) 180 tablet 1   No current facility-administered medications for this visit.    Allergies as of 04/23/2021 - Review Complete 04/23/2021  Allergen Reaction Noted   Carvedilol Other (See Comments) 02/08/2015   Lisinopril Anaphylaxis, Shortness Of Breath, and Swelling 05/30/2015   Amoxicillin Nausea And Vomiting and Nausea Only 02/06/2015    Family History  Problem Relation Age of Onset   Heart attack Father    Stroke Father    Arthritis Father    Heart disease Father    Cancer Mother        ???   Arthritis Mother    Heart disease Brother 40       died in sleep   Early death Brother    Diabetes Maternal Uncle    Alzheimer's disease Maternal Grandmother     Social History   Socioeconomic History   Marital status: Divorced    Spouse name: alice   Number of children: 3   Years of education: 12   Highest education level: Not on file  Occupational  History   Occupation: SSI  Tobacco Use   Smoking status: Every Day  Packs/day: 1.00    Years: 46.00    Pack years: 46.00    Types: Cigarettes   Smokeless tobacco: Former    Types: Nurse, children's Use: Former  Substance and Sexual Activity   Alcohol use: Yes    Comment: couple beers occ   Drug use: No    Comment: history of drug use- marijuana, cocaine- quit 2014   Sexual activity: Yes    Partners: Female    Birth control/protection: None  Other Topics Concern   Not on file  Social History Narrative   Lives with girlfriend [name]   3 children, but 1 OD  In 2019.   Grandchildren-2      Two cats: may and princess; and a dog-foxy      Enjoy: spending time with pets, TV, yard work       Diet: eats all food groups   Caffeine: half and half coffee, some tea-half and half decaf   Water: 6-8 cups daily      Wears seat belt   Does not use phone while driving   Oceanographer at home      Social Determinants of Health   Financial Resource Strain: Low Risk    Difficulty of Paying Living Expenses: Not hard at all  Food Insecurity: No Food Insecurity   Worried About Charity fundraiser in the Last Year: Never true   Arboriculturist in the Last Year: Never true  Transportation Needs: No Transportation Needs   Lack of Transportation (Medical): No   Lack of Transportation (Non-Medical): No  Physical Activity: Insufficiently Active   Days of Exercise per Week: 3 days   Minutes of Exercise per Session: 20 min  Stress: No Stress Concern Present   Feeling of Stress : Not at all  Social Connections: Moderately Integrated   Frequency of Communication with Friends and Family: More than three times a week   Frequency of Social Gatherings with Friends and Family: More than three times a week   Attends Religious Services: More than 4 times per year   Active Member of [employer] or Organizations: Yes   Attends Music therapist: More than 4 times per year   Marital  Status: Divorced    Review of Systems: Gen: Denies fever, chills, anorexia. Denies fatigue, weakness, weight loss.  CV: Denies chest pain, palpitations, syncope, peripheral edema, and claudication. Resp: Denies dyspnea at rest, cough, wheezing, coughing up blood, and pleurisy. GI: see HPI Derm: Denies rash, itching, dry skin Psych: Denies depression, anxiety, memory loss, confusion. No homicidal or suicidal ideation.  Heme: Denies bruising, bleeding, and enlarged lymph nodes.  Physical Exam: BP 134/79   Pulse 85   Temp 97.7 F (36.5 C) (Temporal)   Ht 5\' 5"  (1.651 m)   Wt 205 lb 6.4 oz (93.2 kg)   BMI 34.18 kg/m  General:   Alert and oriented. No distress noted. Pleasant and cooperative.  Head:  Normocephalic and atraumatic. Eyes:  Conjuctiva clear without scleral icterus. Mouth:  mask in place Abdomen:  +BS, distended but soft without evidence for tense ascites. Round AP diameter. Small umbilical hernia. Hepatomegaly with liver margin crossing midline and palpable below rib margin.  Msk:  Symmetrical without gross deformities. Normal posture. Extremities:  Without edema. Neurologic:  Alert and  oriented x4 Psych:  Alert and cooperative. Normal mood and affect.  Lab Results  Component Value Date   ALT 33 04/11/2021   AST 28 04/11/2021  ALKPHOS 145 (H) 04/11/2021   BILITOT 0.9 04/11/2021   Lab Results  Component Value Date   CREATININE 0.70 (L) 04/11/2021   BUN 8 04/11/2021   NA 138 04/11/2021   K 4.4 04/11/2021   CL 97 04/11/2021   CO2 26 04/11/2021   Lab Results  Component Value Date   WBC 7.2 04/11/2021   HGB 15.0 04/11/2021   HCT 44.4 04/11/2021   MCV 87 04/11/2021   PLT 180 04/11/2021     ASSESSMENT: Randy Hebert is a 55 y.o. male presenting today with a history of  Barrett's, cirrhosis due to fatty liver and ETOH,  adenomas with surveillance due in 2023.    Cirrhosis: Due for updated [place] now, which is upcoming. Portal gastropathy on EGD in 2021  with surveillance due in 2023. Unknown immunity to Hep A and B, so we will check this now. Recent labs on file through PCP but need INR. Discussed in detail alcohol cessation to prevent decompensation.   GERD controlled on Protonix BID.    PLAN:  RUQ [place] upcoming Check immune status to Hep A and B Add INR EGD in 2023, along with colonoscopy in 2023 ETOH cessation Return in 6 months to arrange procedures and cirrhosis care  Annitta Needs, PhD, ANP-BC [facility]

## 2021-04-24 ENCOUNTER — Ambulatory Visit (INDEPENDENT_AMBULATORY_CARE_PROVIDER_SITE_OTHER): Payer: Medicare Other | Admitting: Nurse Practitioner

## 2021-04-24 ENCOUNTER — Encounter: Payer: Self-pay | Admitting: Nurse Practitioner

## 2021-04-24 DIAGNOSIS — F419 Anxiety disorder, unspecified: Secondary | ICD-10-CM | POA: Diagnosis not present

## 2021-04-24 DIAGNOSIS — J984 Other disorders of lung: Secondary | ICD-10-CM

## 2021-04-24 NOTE — Assessment & Plan Note (Signed)
-  provided note for emotional support animal, Foxy -he states he feels better with his dog, has less anxiety -we discussed that there is no documentation of his animal passing any certification as an ESA, so this is a convenience for him, but not an indication of any legal recognition of a support animal or service animal; if further documentation is required, he will need to have the animal complete proper training

## 2021-04-24 NOTE — Assessment & Plan Note (Signed)
-  completed handicap placard form for this condition and arthritis

## 2021-04-24 NOTE — Progress Notes (Signed)
Acute Office Visit  Subjective:    Patient ID: Randy Hebert, male    DOB: 1966/03/29, 55 y.o.   MRN: [id number]  Chief Complaint  Patient presents with   Anxiety    Pt c/o anxiety. Wants to be able to keep his  dog,it helps him with his anxiety.   needs a medical cert for  disablility parking placard    Anxiety Symptoms include nervous/anxious behavior.    Patient is in today for a note for emotional support animal.  Past Medical History:  Diagnosis Date   ACE inhibitor-aggravated angioedema    Allergy    Angio-edema    Anxiety    Arthritis    Asthma    hip replacement   Back pain    Bradycardia 04/28/2017   Bulging of cervical intervertebral disc    CAD (coronary artery disease)    lateral STEMI 02/06/2015 00% D1 occlusion treated with Promus Premier 2.5 mm x 16 mm DES, 70% ramus stenosis, 40% mid RCA stenosis, 45% distal RCA stenosis, EF 45-50%   CHF (congestive heart failure) (HCC)    COPD (chronic obstructive pulmonary disease) ([place])    Depression    Diabetes mellitus without complication ([place])    Difficult intubation    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   Dry eye    Dyspnea    Early satiety 09/23/2016   GERD (gastroesophageal reflux disease)    HCAP (healthcare-associated pneumonia) 05/15/2017   Headache    Heart murmur    Hip pain    Hyperlipidemia    Hypertension    Lobar pneumonia ([place]) 05/15/2017   Melena 08/04/2018   MI (myocardial infarction) ([place])    Myocardial infarction ([place])    Neck pain    Non-ST elevation (NSTEMI) myocardial infarction ([place]) 04/27/2017   NSTEMI (non-ST elevated myocardial infarction) ([place]) 04/26/2017   Otitis media    Pleurisy    Pneumonia due to COVID-19 virus 10/07/2019   Rectal bleeding 11/08/2018   Right shoulder pain 03/20/2020   Sinus pause    9 sec sinus pause on telemetry after started on coreg after MI, avoid AV nodal blocking agent   Sleep apnea    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/01/2016   STEMI (ST elevation myocardial infarction) ([place]) 05/31/2015   Substance abuse ([place]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   Syncope 67/54/4920   Syncope and collapse 05/15/2017   Transaminitis 08/04/2018   Unilateral primary osteoarthritis, right hip 07/01/2016    Past Surgical History:  Procedure Laterality Date   BIOPSY  10/09/2016   Procedure: BIOPSY;  Surgeon: Daneil Dolin, MD;  [facility];  Service: Endoscopy;;   BIOPSY  11/28/2019   Procedure: BIOPSY;  Surgeon: Daneil Dolin, MD;  [facility];  Service: Endoscopy;;   CARDIAC CATHETERIZATION N/A 02/06/2015   Procedure: Left Heart Cath and Coronary Angiography;  Surgeon: Leonie Man, MD;  Location: [facility];  Service: Cardiovascular;  Laterality: N/A;   CARDIAC CATHETERIZATION N/A 02/06/2015   Procedure: Coronary Stent Intervention;  Surgeon: Leonie Man, MD;  Location: [facility];  Service: Cardiovascular;  Laterality: N/A;   COLONOSCOPY WITH PROPOFOL N/A 10/09/2016   Sigmoid and descending colon diverticulosis, four 4-6 mm polyps in sigmoid, one 4 mm polyp in descending. Tubular adenomas and hyperplastic. 5 year surveillance.    COLONOSCOPY WITH PROPOFOL N/A 11/24/2016   Sigmoid and descending colon diverticulosis, four 4-6 mm polyps in  sigmoid, one 4 mm polyp in descending. Tubular adenomas and hyperplastic. 5 year surveillance.    CORONARY ANGIOPLASTY WITH STENT PLACEMENT  01/2015   CORONARY STENT INTERVENTION N/A 04/27/2017   Procedure: CORONARY STENT INTERVENTION;  Surgeon: Nelva Bush, MD;  Location: [facility];  Service: Cardiovascular;  Laterality: N/A;   ELECTROPHYSIOLOGY STUDY N/A 06/29/2017   Procedure: ELECTROPHYSIOLOGY STUDY;  Surgeon: Evans Lance, MD;  Location: [facility];  Service: Cardiovascular;  Laterality: N/A;   ESOPHAGOGASTRODUODENOSCOPY (EGD) WITH PROPOFOL N/A 10/09/2016   Dr. Gala Romney: LA grade a esophagitis.  Barrett's esophagus, biopsy-proven.  Small hiatal  hernia.  EGD February 2019   ESOPHAGOGASTRODUODENOSCOPY (EGD) WITH PROPOFOL N/A 11/28/2019    salmon-colored esophageal mucosa (Barrett's) small hiatal hernia, portal hypertensive gastropathy, normal duodenum, 3 year surveillance   INCISION / DRAINAGE HAND / FINGER     LEFT HEART CATH AND CORONARY ANGIOGRAPHY N/A 04/27/2017   Procedure: LEFT HEART CATH AND CORONARY ANGIOGRAPHY;  Surgeon: Nelva Bush, MD;  Location: [facility];  Service: Cardiovascular;  Laterality: N/A;   LOOP RECORDER INSERTION  06/29/2017   Procedure: Loop Recorder Insertion;  Surgeon: Evans Lance, MD;  Location: [facility];  Service: Cardiovascular;;   POLYPECTOMY  11/24/2016   Procedure: POLYPECTOMY;  Surgeon: Daneil Dolin, MD;  [facility];  Service: Endoscopy;;  descending and sigmoid   TOTAL HIP ARTHROPLASTY Right 07/01/2016   TOTAL HIP ARTHROPLASTY Right 07/01/2016   Procedure: RIGHT TOTAL HIP ARTHROPLASTY ANTERIOR APPROACH;  Surgeon: Mcarthur Rossetti, MD;  Location: [facility];  Service: Orthopedics;  Laterality: Right;    Family History  Problem Relation Age of Onset   Heart attack Father    Stroke Father    Arthritis Father    Heart disease Father    Cancer Mother        ???   Arthritis Mother    Heart disease Brother 15       died in sleep   Early death Brother    Diabetes Maternal Uncle    Alzheimer's disease Maternal Grandmother     Social History   Socioeconomic History   Marital status: Divorced    Spouse name: alice   Number of children: 3   Years of education: 12   Highest education level: Not on file  Occupational History   Occupation: SSI  Tobacco Use   Smoking status: Every Day    Packs/day: 1.00    Years: 46.00    Pack years: 46.00    Types: Cigarettes   Smokeless tobacco: Former    Types: Nurse, children's Use: Former  Substance and Sexual Activity   Alcohol use: Yes    Comment: couple beers occ   Drug use: No    Comment:  history of drug use- marijuana, cocaine- quit 2014   Sexual activity: Yes    Partners: Female    Birth control/protection: None  Other Topics Concern   Not on file  Social History Narrative   Lives with girlfriend [name]   3 children, but 1 OD  In 2019.   Grandchildren-2      Two cats: may and princess; and a dog-foxy      Enjoy: spending time with pets, TV, yard work       Diet: eats all food groups   Caffeine: half and half coffee, some tea-half and half decaf   Water: 6-8 cups daily  Wears seat belt   Does not use phone while driving   Smoke detectors at home      Social Determinants of Health   Financial Resource Strain: Low Risk    Difficulty of Paying Living Expenses: Not hard at all  Food Insecurity: No Food Insecurity   Worried About Charity fundraiser in the Last Year: Never true   Arboriculturist in the Last Year: Never true  Transportation Needs: No Transportation Needs   Lack of Transportation (Medical): No   Lack of Transportation (Non-Medical): No  Physical Activity: Insufficiently Active   Days of Exercise per Week: 3 days   Minutes of Exercise per Session: 20 min  Stress: No Stress Concern Present   Feeling of Stress : Not at all  Social Connections: Moderately Integrated   Frequency of Communication with Friends and Family: More than three times a week   Frequency of Social Gatherings with Friends and Family: More than three times a week   Attends Religious Services: More than 4 times per year   Active Member of [employer] or Organizations: Yes   Attends Music therapist: More than 4 times per year   Marital Status: Divorced  Human resources officer Violence: Not At Risk   Fear of Current or Ex-Partner: No   Emotionally Abused: No   Physically Abused: No   Sexually Abused: No    Outpatient Medications Prior to Visit  Medication Sig Dispense Refill   acetaminophen (TYLENOL) 500 MG tablet Take 1,000 mg by mouth every 4 (four) hours as needed  for mild pain or fever.      albuterol (PROVENTIL HFA) 108 (90 Base) MCG/ACT inhaler INHALE 2 PUFFS INTO THE LUNGS EVERY 6 HOURS AS NEEDED FOR SHORTNESS OF BREATH OR WHEEZING. 18 g 1   albuterol (PROVENTIL) (2.5 MG/3ML) 0.083% nebulizer solution Take 3 mLs (2.5 mg total) by nebulization every 6 (six) hours as needed for wheezing or shortness of breath. 150 mL 1   amLODipine (NORVASC) 10 MG tablet TAKE 1 TABLET BY MOUTH ONCE A DAY. 90 tablet 0   ascorbic acid (VITAMIN C) 500 MG tablet Take 1 tablet (500 mg total) by mouth daily. 30 tablet 1   aspirin 81 MG EC tablet Take 1 tablet (81 mg total) by mouth daily. (Patient taking differently: Take 81 mg by mouth in the morning and at bedtime.) 30 tablet    atorvastatin (LIPITOR) 40 MG tablet Take 1 tablet (40 mg total) by mouth every evening. 30 tablet 6   Azelastine HCl 137 MCG/SPRAY SOLN USE 2 SPRAYS IN EACH NOSTRIL TWICE DAILY. 30 mL 0   budesonide-formoterol (SYMBICORT) 160-4.5 MCG/ACT inhaler Inhale 2 puffs into the lungs 2 (two) times daily. 10.2 g 5   Cholecalciferol (VITAMIN D3) 125 MCG (5000 UT) CAPS Take 1 capsule (5,000 Units total) by mouth daily. 90 capsule 0   citalopram (CELEXA) 20 MG tablet Take 20 mg by mouth daily.     cycloSPORINE (RESTASIS) 0.05 % ophthalmic emulsion Place 1 drop into both eyes 2 (two) times daily.     ferrous sulfate 325 (65 FE) MG tablet Take 1 tablet (325 mg total) by mouth daily with breakfast. 90 tablet 1   furosemide (LASIX) 40 MG tablet Take 1 tablet (40 mg total) by mouth daily as needed for fluid. 30 tablet 11   glipiZIDE (GLUCOTROL XL) 5 MG 24 hr tablet TAKE 1 TABLET BY MOUTH DAILY WITH BREAKFAST. 90 tablet 0   glucose blood (ACCU-CHEK  AVIVA PLUS) test strip Use as instructed to check blood glucose once daily, DX E11.59 100 each 12   magnesium gluconate (MAGONATE) 500 MG tablet Take 500 mg by mouth 2 (two) times daily.     metFORMIN (GLUCOPHAGE) 1000 MG tablet TAKE 1 TABLET BY MOUTH TWICE DAILY WITH MEALS.  180 tablet 0   mometasone (ELOCON) 0.1 % cream Apply 1 application topically 2 (two) times a week.      nitroGLYCERIN (NITROSTAT) 0.4 MG SL tablet Place 1 tablet (0.4 mg total) under the tongue every 5 (five) minutes as needed for chest pain. 25 tablet 3   olopatadine (PATANOL) 0.1 % ophthalmic solution 1 drop 2 (two) times daily.     pantoprazole (PROTONIX) 40 MG tablet Take 1 tablet (40 mg total) by mouth 2 (two) times daily before a meal. 60 tablet 3   prednisoLONE acetate (PRED FORTE) 1 % ophthalmic suspension Place 4 drops into both eyes 2 (two) times a week.      Tiotropium Bromide Monohydrate (SPIRIVA RESPIMAT) 2.5 MCG/ACT AERS Inhale 1 puff into the lungs daily. 4 g 5   triamcinolone ointment (KENALOG) 0.1 % Apply 1 application topically 2 (two) times daily. 454 g 1   chlorthalidone (HYGROTON) 25 MG tablet Take 0.5 tablets (12.5 mg total) by mouth daily. (Patient not taking: No sig reported) 45 tablet 1   potassium chloride SA (KLOR-CON) 20 MEQ tablet Take 2 tablets (40 mEq total) by mouth daily. (Patient not taking: No sig reported) 180 tablet 1   No facility-administered medications prior to visit.    Allergies  Allergen Reactions   Carvedilol Other (See Comments)    Sinus pause on telemetry >3 seconds. Longest one 9 sec. No AV nodal agent   Lisinopril Anaphylaxis, Shortness Of Breath and Swelling    Angioedema, required intubation and mechanical ventilation   Amoxicillin Nausea And Vomiting and Nausea Only    Nausea only - no allergy  Did it involve swelling of the face/tongue/throat, SOB, or low BP? No Did it involve sudden or severe rash/hives, skin peeling, or any reaction on the inside of your mouth or nose? No Did you need to seek medical attention at a hospital or doctor's office? No When did it last happen? childhood reaction      If all above answers are "NO", may proceed with cephalosporin use.     Review of Systems  Constitutional: Negative.   Respiratory:  Negative.    Cardiovascular: Negative.   Psychiatric/Behavioral:  The patient is nervous/anxious.        Feels better with his dog, Foxy      Objective:    Physical Exam Constitutional:      Appearance: Normal appearance. He is obese.  Cardiovascular:     Rate and Rhythm: Normal rate and regular rhythm.     Pulses: Normal pulses.     Heart sounds: Normal heart sounds.  Pulmonary:     Effort: Pulmonary effort is normal.     Breath sounds: Normal breath sounds.  Neurological:     Mental Status: He is alert.  Psychiatric:        Mood and Affect: Mood normal.        Behavior: Behavior normal.        Thought Content: Thought content normal.        Judgment: Judgment normal.    BP 135/78   Pulse 85   Temp 98.2 F (36.8 C)   Resp (!) 22   Ht 5'  5" (1.651 m)   Wt 206 lb (93.4 kg)   SpO2 (!) 89%   BMI 34.28 kg/m  Wt Readings from Last 3 Encounters:  04/24/21 206 lb (93.4 kg)  04/23/21 205 lb 6.4 oz (93.2 kg)  04/17/21 208 lb (94.3 kg)    Health Maintenance Due  Topic Date Due   Zoster Vaccines- Shingrix (1 of 2) Never done   COVID-19 Vaccine (2 - Moderna series) 03/19/2020   URINE MICROALBUMIN  11/08/2020   OPHTHALMOLOGY EXAM  02/15/2021   INFLUENZA VACCINE  04/01/2021    There are no preventive care reminders to display for this patient.   Lab Results  Component Value Date   TSH 3.61 06/21/2020   Lab Results  Component Value Date   WBC 7.2 04/11/2021   HGB 15.0 04/11/2021   HCT 44.4 04/11/2021   MCV 87 04/11/2021   PLT 180 04/11/2021   Lab Results  Component Value Date   NA 138 04/11/2021   K 4.4 04/11/2021   CO2 26 04/11/2021   GLUCOSE 189 (H) 04/11/2021   BUN 8 04/11/2021   CREATININE 0.70 (L) 04/11/2021   BILITOT 0.9 04/11/2021   ALKPHOS 145 (H) 04/11/2021   AST 28 04/11/2021   ALT 33 04/11/2021   PROT 7.0 04/11/2021   ALBUMIN 4.2 04/11/2021   CALCIUM 9.7 04/11/2021   ANIONGAP 8 10/11/2019   EGFR 108 04/11/2021   Lab Results  Component  Value Date   CHOL 124 04/11/2021   Lab Results  Component Value Date   HDL 28 (L) 04/11/2021   Lab Results  Component Value Date   LDLCALC 74 04/11/2021   Lab Results  Component Value Date   TRIG 118 04/11/2021   Lab Results  Component Value Date   CHOLHDL 7.1 (H) 06/21/2020   Lab Results  Component Value Date   HGBA1C 9.0 (H) 04/11/2021       Assessment & Plan:   Problem List Items Addressed This Visit       Respiratory   Restrictive lung disease    -completed handicap placard form for this condition and arthritis        Other   Anxiety    -provided note for emotional support animal, Foxy -he states he feels better with his dog, has less anxiety -we discussed that there is no documentation of his animal passing any certification as an ESA, so this is a convenience for him, but not an indication of any legal recognition of a support animal or service animal; if further documentation is required, he will need to have the animal complete proper training        No orders of the defined types were placed in this encounter.    Noreene Larsson, NP

## 2021-04-25 ENCOUNTER — Telehealth: Payer: Self-pay | Admitting: Cardiology

## 2021-04-25 ENCOUNTER — Other Ambulatory Visit: Payer: Self-pay

## 2021-04-25 ENCOUNTER — Ambulatory Visit (HOSPITAL_COMMUNITY)
Admission: RE | Admit: 2021-04-25 | Discharge: 2021-04-25 | Disposition: A | Discharge status: Home / Self Care | Payer: Medicare Other | Source: Ambulatory Visit | Attending: Gastroenterology | Admitting: Gastroenterology

## 2021-04-25 DIAGNOSIS — K7460 Unspecified cirrhosis of liver: Secondary | ICD-10-CM | POA: Diagnosis not present

## 2021-04-25 DIAGNOSIS — K824 Cholesterolosis of gallbladder: Secondary | ICD-10-CM | POA: Insufficient documentation

## 2021-04-25 DIAGNOSIS — I2510 Atherosclerotic heart disease of native coronary artery without angina pectoris: Secondary | ICD-10-CM

## 2021-04-25 NOTE — Progress Notes (Signed)
Echo ordered per Dr. [name].

## 2021-04-26 ENCOUNTER — Other Ambulatory Visit: Payer: Self-pay

## 2021-04-26 NOTE — Progress Notes (Signed)
Error

## 2021-05-01 ENCOUNTER — Ambulatory Visit (INDEPENDENT_AMBULATORY_CARE_PROVIDER_SITE_OTHER): Payer: Medicare Other

## 2021-05-01 DIAGNOSIS — J309 Allergic rhinitis, unspecified: Secondary | ICD-10-CM | POA: Diagnosis not present

## 2021-05-02 ENCOUNTER — Other Ambulatory Visit: Payer: Self-pay

## 2021-05-02 ENCOUNTER — Ambulatory Visit (INDEPENDENT_AMBULATORY_CARE_PROVIDER_SITE_OTHER): Payer: Medicare Other | Admitting: "Endocrinology

## 2021-05-02 ENCOUNTER — Encounter: Payer: Self-pay | Admitting: "Endocrinology

## 2021-05-02 VITALS — BP 143/78 | HR 92 | Ht 65.0 in | Wt 206.2 lb

## 2021-05-02 DIAGNOSIS — I10 Essential (primary) hypertension: Secondary | ICD-10-CM

## 2021-05-02 DIAGNOSIS — F17200 Nicotine dependence, unspecified, uncomplicated: Secondary | ICD-10-CM | POA: Diagnosis not present

## 2021-05-02 DIAGNOSIS — E1159 Type 2 diabetes mellitus with other circulatory complications: Secondary | ICD-10-CM

## 2021-05-02 DIAGNOSIS — E782 Mixed hyperlipidemia: Secondary | ICD-10-CM

## 2021-05-02 MED ORDER — TRULICITY 0.75 MG/0.5ML ~~LOC~~ SOAJ: 0.7500 mg | SUBCUTANEOUS | 2 refills | Status: DC

## 2021-05-02 NOTE — Progress Notes (Signed)
05/02/2021, 12:46 PM      Endocrinology follow-up note    Subjective:    Patient ID: Randy Hebert, male    DOB: Jun 08, 1966.  Randy Hebert is being seen in  follow-up for management of currently uncontrolled symptomatic type 2 diabetes, hyperlipidemia, hypertension. PMD:  Noreene Larsson, NP.   Past Medical History:  Diagnosis Date   ACE inhibitor-aggravated angioedema    Allergy    Angio-edema    Anxiety    Arthritis    Asthma    hip replacement   Back pain    Bradycardia 04/28/2017   Bulging of cervical intervertebral disc    CAD (coronary artery disease)    lateral STEMI 02/06/2015 00% D1 occlusion treated with Promus Premier 2.5 mm x 16 mm DES, 70% ramus stenosis, 40% mid RCA stenosis, 45% distal RCA stenosis, EF 45-50%   CHF (congestive heart failure) (HCC)    COPD (chronic obstructive pulmonary disease) ([place])    Depression    Diabetes mellitus without complication ([place])    Difficult intubation    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   Dry eye    Dyspnea    Early satiety 09/23/2016   GERD (gastroesophageal reflux disease)    HCAP (healthcare-associated pneumonia) 05/15/2017   Headache    Heart murmur    Hip pain    Hyperlipidemia    Hypertension    Lobar pneumonia ([place]) 05/15/2017   Melena 08/04/2018   MI (myocardial infarction) ([place])    Myocardial infarction ([place])    Neck pain    Non-ST elevation (NSTEMI) myocardial infarction ([place]) 04/27/2017   NSTEMI (non-ST elevated myocardial infarction) ([place]) 04/26/2017   Otitis media    Pleurisy    Pneumonia due to COVID-19 virus 10/07/2019   Rectal bleeding 11/08/2018   Right shoulder pain 03/20/2020   Sinus pause    9 sec sinus pause on telemetry after started on coreg after MI, avoid AV nodal blocking agent   Sleep apnea    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   STEMI (ST elevation myocardial infarction) ([place]) 05/31/2015   Substance abuse ([place]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    Syncope 37/48/2707   Syncope and collapse 05/15/2017   Transaminitis 08/04/2018   Unilateral primary osteoarthritis, right hip 07/01/2016   Past Surgical History:  Procedure Laterality Date   BIOPSY  10/09/2016   Procedure: BIOPSY;  Surgeon: Daneil Dolin, MD;  [facility];  Service: Endoscopy;;   BIOPSY  11/28/2019   Procedure: BIOPSY;  Surgeon: Daneil Dolin, MD;  [facility];  Service: Endoscopy;;   CARDIAC CATHETERIZATION N/A 02/06/2015   Procedure: Left Heart Cath and Coronary Angiography;  Surgeon: Leonie Man, MD;  Location: [facility];  Service: Cardiovascular;  Laterality: N/A;   CARDIAC CATHETERIZATION N/A 02/06/2015   Procedure: Coronary Stent Intervention;  Surgeon: Leonie Man, MD;  Location: [facility];  Service: Cardiovascular;  Laterality: N/A;   COLONOSCOPY WITH PROPOFOL N/A 10/09/2016   Sigmoid and descending colon diverticulosis, four 4-6 mm polyps in sigmoid, one 4 mm polyp in descending. Tubular adenomas and hyperplastic. 5 year surveillance.    COLONOSCOPY WITH PROPOFOL N/A 11/24/2016   Sigmoid and descending colon diverticulosis, four 4-6 mm polyps in sigmoid, one 4 mm polyp in descending. Tubular adenomas and hyperplastic. 5 year surveillance.    CORONARY ANGIOPLASTY WITH STENT PLACEMENT  01/2015  CORONARY STENT INTERVENTION N/A 04/27/2017   Procedure: CORONARY STENT INTERVENTION;  Surgeon: Nelva Bush, MD;  Location: [facility];  Service: Cardiovascular;  Laterality: N/A;   ELECTROPHYSIOLOGY STUDY N/A 06/29/2017   Procedure: ELECTROPHYSIOLOGY STUDY;  Surgeon: Evans Lance, MD;  Location: [facility];  Service: Cardiovascular;  Laterality: N/A;   ESOPHAGOGASTRODUODENOSCOPY (EGD) WITH PROPOFOL N/A 10/09/2016   Dr. Gala Romney: LA grade a esophagitis.  Barrett's esophagus, biopsy-proven.  Small hiatal hernia.  EGD February 2019   ESOPHAGOGASTRODUODENOSCOPY (EGD) WITH PROPOFOL N/A 11/28/2019    salmon-colored  esophageal mucosa (Barrett's) small hiatal hernia, portal hypertensive gastropathy, normal duodenum, 3 year surveillance   INCISION / DRAINAGE HAND / FINGER     LEFT HEART CATH AND CORONARY ANGIOGRAPHY N/A 04/27/2017   Procedure: LEFT HEART CATH AND CORONARY ANGIOGRAPHY;  Surgeon: Nelva Bush, MD;  Location: [facility];  Service: Cardiovascular;  Laterality: N/A;   LOOP RECORDER INSERTION  06/29/2017   Procedure: Loop Recorder Insertion;  Surgeon: Evans Lance, MD;  Location: [facility];  Service: Cardiovascular;;   POLYPECTOMY  11/24/2016   Procedure: POLYPECTOMY;  Surgeon: Daneil Dolin, MD;  [facility];  Service: Endoscopy;;  descending and sigmoid   TOTAL HIP ARTHROPLASTY Right 07/01/2016   TOTAL HIP ARTHROPLASTY Right 07/01/2016   Procedure: RIGHT TOTAL HIP ARTHROPLASTY ANTERIOR APPROACH;  Surgeon: Mcarthur Rossetti, MD;  Location: [facility];  Service: Orthopedics;  Laterality: Right;   Social History   Socioeconomic History   Marital status: Divorced    Spouse name: alice   Number of children: 3   Years of education: 12   Highest education level: Not on file  Occupational History   Occupation: SSI  Tobacco Use   Smoking status: Every Day    Packs/day: 1.00    Years: 46.00    Pack years: 46.00    Types: Cigarettes   Smokeless tobacco: Former    Types: Nurse, children's Use: Former  Substance and Sexual Activity   Alcohol use: Yes    Comment: couple beers occ   Drug use: No    Comment: history of drug use- marijuana, cocaine- quit 2014   Sexual activity: Yes    Partners: Female    Birth control/protection: None  Other Topics Concern   Not on file  Social History Narrative   Lives with girlfriend [name]   3 children, but 1 OD  In 2019.   Grandchildren-2      Two cats: may and princess; and a dog-foxy      Enjoy: spending time with pets, TV, yard work       Diet: eats all food groups   Caffeine: half and half coffee,  some tea-half and half decaf   Water: 6-8 cups daily      Wears seat belt   Does not use phone while driving   Oceanographer at home      Social Determinants of Health   Financial Resource Strain: Low Risk    Difficulty of Paying Living Expenses: Not hard at all  Food Insecurity: No Food Insecurity   Worried About Charity fundraiser in the Last Year: Never true   Arboriculturist in the Last Year: Never true  Transportation Needs: No Transportation Needs   Lack of Transportation (Medical): No   Lack of Transportation (Non-Medical): No  Physical Activity: Insufficiently Active   Days of Exercise per Week: 3 days  Minutes of Exercise per Session: 20 min  Stress: No Stress Concern Present   Feeling of Stress : Not at all  Social Connections: Moderately Integrated   Frequency of Communication with Friends and Family: More than three times a week   Frequency of Social Gatherings with Friends and Family: More than three times a week   Attends Religious Services: More than 4 times per year   Active Member of [employer] or Organizations: Yes   Attends Archivist Meetings: More than 4 times per year   Marital Status: Divorced   Outpatient Encounter Medications as of 05/02/2021  Medication Sig   Dulaglutide (TRULICITY) 1.60 FU/9.3AT SOPN Inject 0.75 mg into the skin once a week.   acetaminophen (TYLENOL) 500 MG tablet Take 1,000 mg by mouth every 4 (four) hours as needed for mild pain or fever.    albuterol (PROVENTIL HFA) 108 (90 Base) MCG/ACT inhaler INHALE 2 PUFFS INTO THE LUNGS EVERY 6 HOURS AS NEEDED FOR SHORTNESS OF BREATH OR WHEEZING.   albuterol (PROVENTIL) (2.5 MG/3ML) 0.083% nebulizer solution Take 3 mLs (2.5 mg total) by nebulization every 6 (six) hours as needed for wheezing or shortness of breath.   amLODipine (NORVASC) 10 MG tablet TAKE 1 TABLET BY MOUTH ONCE A DAY.   ascorbic acid (VITAMIN C) 500 MG tablet Take 1 tablet (500 mg total) by mouth daily.   aspirin 81 MG  EC tablet Take 1 tablet (81 mg total) by mouth daily. (Patient taking differently: Take 81 mg by mouth in the morning and at bedtime.)   atorvastatin (LIPITOR) 40 MG tablet Take 1 tablet (40 mg total) by mouth every evening.   Azelastine HCl 137 MCG/SPRAY SOLN USE 2 SPRAYS IN EACH NOSTRIL TWICE DAILY.   budesonide-formoterol (SYMBICORT) 160-4.5 MCG/ACT inhaler Inhale 2 puffs into the lungs 2 (two) times daily.   Cholecalciferol (VITAMIN D3) 125 MCG (5000 UT) CAPS Take 1 capsule (5,000 Units total) by mouth daily.   citalopram (CELEXA) 20 MG tablet Take 20 mg by mouth daily.   cycloSPORINE (RESTASIS) 0.05 % ophthalmic emulsion Place 1 drop into both eyes 2 (two) times daily.   ferrous sulfate 325 (65 FE) MG tablet Take 1 tablet (325 mg total) by mouth daily with breakfast.   furosemide (LASIX) 40 MG tablet Take 1 tablet (40 mg total) by mouth daily as needed for fluid.   glipiZIDE (GLUCOTROL XL) 5 MG 24 hr tablet TAKE 1 TABLET BY MOUTH DAILY WITH BREAKFAST.   glucose blood (ACCU-CHEK AVIVA PLUS) test strip Use as instructed to check blood glucose once daily, DX E11.59   magnesium gluconate (MAGONATE) 500 MG tablet Take 500 mg by mouth 2 (two) times daily.   metFORMIN (GLUCOPHAGE) 1000 MG tablet TAKE 1 TABLET BY MOUTH TWICE DAILY WITH MEALS.   mometasone (ELOCON) 0.1 % cream Apply 1 application topically 2 (two) times a week.    nitroGLYCERIN (NITROSTAT) 0.4 MG SL tablet Place 1 tablet (0.4 mg total) under the tongue every 5 (five) minutes as needed for chest pain.   olopatadine (PATANOL) 0.1 % ophthalmic solution 1 drop 2 (two) times daily.   pantoprazole (PROTONIX) 40 MG tablet Take 1 tablet (40 mg total) by mouth 2 (two) times daily before a meal.   prednisoLONE acetate (PRED FORTE) 1 % ophthalmic suspension Place 4 drops into both eyes 2 (two) times a week.    Tiotropium Bromide Monohydrate (SPIRIVA RESPIMAT) 2.5 MCG/ACT AERS Inhale 1 puff into the lungs daily.   triamcinolone ointment (KENALOG)  0.1 % Apply 1 application topically 2 (two) times daily.   No facility-administered encounter medications on file as of 05/02/2021.    ALLERGIES: Allergies  Allergen Reactions   Carvedilol Other (See Comments)    Sinus pause on telemetry >3 seconds. Longest one 9 sec. No AV nodal agent   Lisinopril Anaphylaxis, Shortness Of Breath and Swelling    Angioedema, required intubation and mechanical ventilation   Amoxicillin Nausea And Vomiting and Nausea Only    Nausea only - no allergy  Did it involve swelling of the face/tongue/throat, SOB, or low BP? No Did it involve sudden or severe rash/hives, skin peeling, or any reaction on the inside of your mouth or nose? No Did you need to seek medical attention at a hospital or doctor's office? No When did it last happen? childhood reaction      If all above answers are "NO", may proceed with cephalosporin use.     VACCINATION STATUS: Immunization History  Administered Date(s) Administered   Influenza,inj,Quad PF,6+ Mos 07/03/2016, 05/07/2017, 06/20/2020   Influenza-Unspecified 05/17/2018   Moderna Sars-Covid-2 Vaccination 02/20/2020   Pneumococcal Conjugate-13 06/17/2017   Pneumococcal Polysaccharide-23 02/07/2015   Tdap 05/17/2019    Diabetes He presents for his follow-up diabetic visit. He has type 2 diabetes mellitus. Onset time: He was diagnosed at approximate age of 53 years. His disease course has been worsening. There are no hypoglycemic associated symptoms. Pertinent negatives for hypoglycemia include no confusion, headaches, pallor or seizures. Pertinent negatives for diabetes include no chest pain, no fatigue, no polydipsia, no polyphagia, no polyuria and no weakness. There are no hypoglycemic complications. Symptoms are worsening. Diabetic complications include heart disease. Risk factors for coronary artery disease include dyslipidemia, diabetes mellitus, hypertension, male sex, family history, obesity, sedentary lifestyle and  tobacco exposure. Current diabetic treatment includes oral agent (monotherapy). He is compliant with treatment most of the time. His weight is fluctuating minimally. He is following a generally unhealthy diet. When asked about meal planning, he reported none. He has had a previous visit with a dietitian. He never participates in exercise. His home blood glucose trend is increasing steadily. His breakfast blood glucose range is generally >200 mg/dl. His overall blood glucose range is >200 mg/dl. (He presents with meter showing average blood glucose of 1 97-25 for the last 30 days.  His point-of-care A1c is 9% increasing from 6.1%.  He did not document any hypoglycemia.   ) An ACE inhibitor/angiotensin II receptor blocker is contraindicated (He has documented allergy for ACE inhibitor's). He does not see a podiatrist.Eye exam is not current.  Hyperlipidemia This is a chronic problem. The current episode started more than 1 year ago. Exacerbating diseases include diabetes and obesity. Pertinent negatives include no chest pain, myalgias or shortness of breath. Current antihyperlipidemic treatment includes statins. Risk factors for coronary artery disease include dyslipidemia, diabetes mellitus, family history, obesity, male sex, hypertension and a sedentary lifestyle.  Hypertension This is a chronic problem. The current episode started more than 1 year ago. Pertinent negatives include no chest pain, headaches, neck pain, palpitations or shortness of breath. Risk factors for coronary artery disease include diabetes mellitus, dyslipidemia, obesity, sedentary lifestyle and smoking/tobacco exposure. Past treatments include calcium channel blockers. Hypertensive end-organ damage includes CAD/MI.   Review of systems     Objective:    BP (!) 143/78   Pulse 92   Ht 5\' 5"  (1.651 m)   Wt 206 lb 3.2 oz (93.5 kg)   BMI 34.31 kg/m  Wt Readings from Last 3 Encounters:  05/02/21 206 lb 3.2 oz (93.5 kg)  04/24/21  206 lb (93.4 kg)  04/23/21 205 lb 6.4 oz (93.2 kg)     Physical Exam- Limited    CMP     Component Value Date/Time   NA 138 04/11/2021 0819   K 4.4 04/11/2021 0819   CL 97 04/11/2021 0819   CO2 26 04/11/2021 0819   GLUCOSE 189 (H) 04/11/2021 0819   GLUCOSE 159 (H) 06/21/2020 0707   BUN 8 04/11/2021 0819   CREATININE 0.70 (L) 04/11/2021 0819   CREATININE 0.67 (L) 06/21/2020 0707   CALCIUM 9.7 04/11/2021 0819   PROT 7.0 04/11/2021 0819   ALBUMIN 4.2 04/11/2021 0819   AST 28 04/11/2021 0819   ALT 33 04/11/2021 0819   ALKPHOS 145 (H) 04/11/2021 0819   BILITOT 0.9 04/11/2021 0819   GFRNONAA 110 10/24/2020 0811   GFRNONAA 108 06/21/2020 0707   GFRAA 127 10/24/2020 0811   GFRAA 125 06/21/2020 0707    Diabetic Labs (most recent): Lab Results  Component Value Date   HGBA1C 9.0 (H) 04/11/2021   HGBA1C 6.1 10/17/2020   HGBA1C 6.3 (H) 06/21/2020     Lipid Panel ( most recent) Lipid Panel     Component Value Date/Time   CHOL 124 04/11/2021 0819   TRIG 118 04/11/2021 0819   HDL 28 (L) 04/11/2021 0819   CHOLHDL 7.1 (H) 06/21/2020 0707   VLDL 41 (H) 04/27/2017 0505   LDLCALC 74 04/11/2021 0819   LDLCALC 116 (H) 06/21/2020 0707      Lab Results  Component Value Date   TSH 3.61 06/21/2020   TSH 1.58 11/03/2018   TSH 1.79 11/17/2017   TSH 1.635 04/26/2017   TSH 2.564 06/27/2015   FREET4 1.5 11/03/2018   FREET4 1.4 11/17/2017      Assessment & Plan:   1. DM type 2 causing vascular disease ([place])  - Randy Hebert has currently uncontrolled symptomatic type 2 DM since  55 years of age.  He presents with meter showing average blood glucose of 1 97-25 for the last 30 days.  His point-of-care A1c is 9% increasing from 6.1%.  He did not document any hypoglycemia.     -his diabetes is complicated by  coronary artery disease which required stent placement, obesity/sedentary life, chronic heavy smoking, and Randy Hebert remains at a high risk for more acute and  chronic complications which include CAD, CVA, CKD, retinopathy, and neuropathy. These are all discussed in detail with the patient.  - I have counseled him on diet management and weight loss, by adopting a carbohydrate restricted/protein rich diet. -Still admits to dietary indiscretions including consumption of sweets and sweetened beverages.  - he acknowledges that there is a room for improvement in his food and drink choices. - Suggestion is made for him to avoid simple carbohydrates  from his diet including Cakes, Sweet Desserts, Ice Cream, Soda (diet and regular), Sweet Tea, Candies, Chips, Cookies, Store Bought Juices, Alcohol in Excess of  1-2 drinks a day, Artificial Sweeteners,  Coffee Creamer, and "Sugar-free" Products, Lemonade. This will help patient to have more stable blood glucose profile and potentially avoid unintended weight gain.   - I encouraged him to switch to  unprocessed or minimally processed complex starch and increased protein intake (animal or plant source), fruits, and vegetables.  - he is advised to stick to a routine mealtimes to eat 3 meals  a day and avoid unnecessary snacks (  to snack only to correct hypoglycemia).    - I have approached him with the following individualized plan to manage diabetes and patient agrees:   -In light of his presentation with loss of control of diabetes, he will need more medications.  I discussed and added Trulicity 2.95 mg subcutaneously weekly.  Side effects and precautions with this medication is discussed in detail with him.  He is a heavy smoker, dose adjustment will be made slowly.    -He is advised to continue metformin 1000 mg p.o. twice daily, glipizide 5 mg XL p.o. daily at breakfast.    -He is approached and willing to  continue monitoring blood glucose at least once a day-daily before breakfast.   -He is encouraged to call clinic for blood glucose readings less than 70 or greater than 200x3 in a week.   -Patient specific  target  A1c;  LDL, HDL, Triglycerides, and  Waist Circumference were discussed in detail.  2) BP/HTN:  -His blood pressures not controlled to target.  -He remains a chronic heavy smoker. He has a documented allergy of ACE inhibitors anaphylaxis .  He is advised to continue amlodipine 10 mg p.o. daily, with as needed diuretics.  He is extensively counseled for smoking cessation.     3) Lipids/HPL: Recent lipid panel showed improved LDL at 74 triglycerides at 118.    He is advised to avoid butter and fried food.  Due to elevated liver enzymes (which are mainly due to his alcohol abuse) his atorvastatin was lowered to 40 mg p.o. nightly.     The patient was counseled on the dangers of tobacco use, and was advised to quit.  Reviewed strategies to maximize success, including removing cigarettes and smoking materials from environment.    4)  Weight/Diet: His BMI is 34.3- abdominal obesity clearly complicating his diabetes care.  He is a candidate for modest weight loss.  CDE Consult has been initiated and in progress,  exercise, and detailed carbohydrates information provided.  5) Chronic Care/Health Maintenance:  -he  is on Statin medications and  is encouraged to continue to follow up with Ophthalmology, Dentist,  Podiatrist at least yearly or according to recommendations, and advised to  Quit smoking ( is a chronic heavy smoker for the last 40 days), and consider weaning off of alcohol.  I have recommended yearly flu vaccine and pneumonia vaccination at least every 5 years; moderate intensity exercise for up to 150 minutes weekly; and  sleep for at least 7 hours a day.   His recent screening ABI was negative for PAD in September 2022.  His study will be repeated in 5 years or sooner if needed.    - I advised patient to maintain close follow up with Noreene Larsson, NP for primary care needs.   I spent 41 minutes in the care of the patient today including review of labs from [facility], Lipids,  Thyroid Function, Hematology (current and previous including abstractions from other facilities); face-to-face time discussing  his blood glucose readings/logs, discussing hypoglycemia and hyperglycemia episodes and symptoms, medications doses, his options of short and long term treatment based on the latest standards of care / guidelines;  discussion about incorporating lifestyle medicine;  and documenting the encounter.    Please refer to Patient Instructions for Blood Glucose Monitoring and Insulin/Medications Dosing Guide"  in media tab for additional information. Please  also refer to " Patient Self Inventory" in the Media  tab for reviewed elements of pertinent patient history.  Randy Hebert participated in the discussions, expressed understanding, and voiced agreement with the above plans.  All questions were answered to his satisfaction. he is encouraged to contact clinic should he have any questions or concerns prior to his return visit.   Follow up plan: - Return in about 10 weeks (around 07/11/2021) for Bring Meter and Logs- A1c in Office.  Glade Lloyd, MD [facility] [address] [place], [place] [address] Phone: [phone]  Fax: [phone]    05/02/2021, 12:46 PM  This note was partially dictated with voice recognition software. Similar sounding words can be transcribed inadequately or may not  be corrected upon review.

## 2021-05-02 NOTE — Patient Instructions (Signed)

## 2021-05-08 ENCOUNTER — Ambulatory Visit (INDEPENDENT_AMBULATORY_CARE_PROVIDER_SITE_OTHER): Payer: Medicare Other

## 2021-05-08 DIAGNOSIS — J309 Allergic rhinitis, unspecified: Secondary | ICD-10-CM

## 2021-05-09 ENCOUNTER — Ambulatory Visit (INDEPENDENT_AMBULATORY_CARE_PROVIDER_SITE_OTHER): Payer: Medicare Other | Admitting: Pharmacist

## 2021-05-09 DIAGNOSIS — E6609 Other obesity due to excess calories: Secondary | ICD-10-CM

## 2021-05-09 DIAGNOSIS — I2510 Atherosclerotic heart disease of native coronary artery without angina pectoris: Secondary | ICD-10-CM

## 2021-05-09 DIAGNOSIS — E782 Mixed hyperlipidemia: Secondary | ICD-10-CM

## 2021-05-09 DIAGNOSIS — J449 Chronic obstructive pulmonary disease, unspecified: Secondary | ICD-10-CM

## 2021-05-09 DIAGNOSIS — I5042 Chronic combined systolic (congestive) and diastolic (congestive) heart failure: Secondary | ICD-10-CM

## 2021-05-09 DIAGNOSIS — F17219 Nicotine dependence, cigarettes, with unspecified nicotine-induced disorders: Secondary | ICD-10-CM

## 2021-05-09 DIAGNOSIS — I10 Essential (primary) hypertension: Secondary | ICD-10-CM

## 2021-05-09 DIAGNOSIS — I5022 Chronic systolic (congestive) heart failure: Secondary | ICD-10-CM

## 2021-05-09 DIAGNOSIS — E1159 Type 2 diabetes mellitus with other circulatory complications: Secondary | ICD-10-CM

## 2021-05-09 DIAGNOSIS — Z6834 Body mass index (BMI) 34.0-34.9, adult: Secondary | ICD-10-CM

## 2021-05-09 MED ORDER — ALBUTEROL SULFATE (2.5 MG/3ML) 0.083% IN NEBU: 2.5000 mg | INHALATION_SOLUTION | Freq: Four times a day (QID) | RESPIRATORY_TRACT | 1 refills | Status: DC | PRN

## 2021-05-09 MED ORDER — VARENICLINE TARTRATE 0.5 MG X 11 & 1 MG X 42 PO MISC: ORAL | 0 refills | Status: DC

## 2021-05-09 NOTE — Chronic Care Management (AMB) (Signed)
Chronic Care Management Pharmacy Note  05/09/2021 Name:  Randy Hebert MRN:  [id number] DOB:  12-22-1965  Summary: Patient notes a blood glucose of 188 post prandial last night which is an improvement from 200s before starting Trulicity Per Dr. Dorris Fetch, will continue to titrate Trulicity; however, will titrate slowly given increased risk of pancreatitis given current cigarette smoking   Recommendations/Changes made from today's visit: After repeat echocardiogram, consider addition of SGLT2i +/- spironolactone given contraindication to other guideline directed medication therapy Currently smokes half pack per day and wants to cut back; has tried the nicotine patch previously and is willing to try Chantix. Discussed with PCP and will send in Chantix prescription. Follow-up in 2 weeks to discuss further.  Subjective: Randy Hebert is an 55 y.o. year old male who is a primary patient of Noreene Larsson, NP.  The CCM team was consulted for assistance with disease management and care coordination needs.    Engaged with patient by telephone for follow up visit in response to provider referral for pharmacy case management and/or care coordination services.   Consent to Services:  The patient was given information about Chronic Care Management services, agreed to services, and gave verbal consent prior to initiation of services.  Please see initial visit note for detailed documentation.   Patient Care Team: Noreene Larsson, NP as PCP - General (Nurse Practitioner) Evans Lance, MD as PCP - Electrophysiology (Cardiology) Arnoldo Lenis, MD as PCP - Cardiology (Cardiology) Beryle Lathe, [facility] (Pharmacist)  Objective:  Lab Results  Component Value Date   CREATININE 0.70 (L) 04/11/2021   CREATININE 0.64 (L) 10/24/2020   CREATININE 0.62 (L) 10/11/2020    Lab Results  Component Value Date   HGBA1C 9.0 (H) 04/11/2021   Last diabetic Eye exam:  Lab Results  Component Value  Date/Time   HMDIABEYEEXA No Retinopathy 02/16/2020 12:00 AM    Last diabetic Foot exam: No results found for: HMDIABFOOTEX      Component Value Date/Time   CHOL 124 04/11/2021 0819   TRIG 118 04/11/2021 0819   HDL 28 (L) 04/11/2021 0819   CHOLHDL 7.1 (H) 06/21/2020 0707   VLDL 41 (H) 04/27/2017 0505   LDLCALC 74 04/11/2021 0819   LDLCALC 116 (H) 06/21/2020 0707    Hepatic Function Latest Ref Rng & Units 04/11/2021 10/24/2020 10/11/2020  Total Protein 6.0 - 8.5 g/dL 7.0 7.3 7.4  Albumin 3.8 - 4.9 g/dL 4.2 4.2 4.4  AST 0 - 40 IU/L 28 28 48(H)  ALT 0 - 44 IU/L 33 20 30  Alk Phosphatase 44 - 121 IU/L 145(H) 164(H) 169(H)  Total Bilirubin 0.0 - 1.2 mg/dL 0.9 1.0 1.0  Bilirubin, Direct 0.0 - 0.2 mg/dL - - -    Lab Results  Component Value Date/Time   TSH 3.61 06/21/2020 07:07 AM   TSH 1.58 11/03/2018 07:05 AM   FREET4 1.5 11/03/2018 07:05 AM   FREET4 1.4 11/17/2017 07:09 AM    CBC Latest Ref Rng & Units 04/11/2021 10/24/2020 06/21/2020  WBC 3.4 - 10.8 x10E3/uL 7.2 6.0 7.5  Hemoglobin 13.0 - 17.7 g/dL 15.0 11.8(L) 15.2  Hematocrit 37.5 - 51.0 % 44.4 39.1 45.6  Platelets 150 - 450 x10E3/uL 180 296 228    Lab Results  Component Value Date/Time   VD25OH 36 06/21/2020 07:07 AM   VD25OH 31 11/09/2019 07:14 AM    Clinical ASCVD: Yes  The ASCVD Risk score (Arnett DK, et al., 2019) failed to calculate  for the following reasons:   The valid total cholesterol range is 130 to 320 mg/dL    Social History   Tobacco Use  Smoking Status Every Day   Packs/day: 1.00   Years: 46.00   Pack years: 46.00   Types: Cigarettes  Smokeless Tobacco Former   Types: Chew   BP Readings from Last 3 Encounters:  05/02/21 (!) 143/78  04/24/21 135/78  04/23/21 134/79   Pulse Readings from Last 3 Encounters:  05/02/21 92  04/24/21 85  04/23/21 85   Wt Readings from Last 3 Encounters:  05/02/21 206 lb 3.2 oz (93.5 kg)  04/24/21 206 lb (93.4 kg)  04/23/21 205 lb 6.4 oz (93.2 kg)     Assessment: Review of patient past medical history, allergies, medications, health status, including review of consultants reports, laboratory and other test data, was performed as part of comprehensive evaluation and provision of chronic care management services.   SDOH:  (Social Determinants of Health) assessments and interventions performed:    CCM Care Plan  Allergies  Allergen Reactions   Carvedilol Other (See Comments)    Sinus pause on telemetry >3 seconds. Longest one 9 sec. No AV nodal agent   Lisinopril Anaphylaxis, Shortness Of Breath and Swelling    Angioedema, required intubation and mechanical ventilation   Amoxicillin Nausea And Vomiting and Nausea Only    Nausea only - no allergy  Did it involve swelling of the face/tongue/throat, SOB, or low BP? No Did it involve sudden or severe rash/hives, skin peeling, or any reaction on the inside of your mouth or nose? No Did you need to seek medical attention at a hospital or doctor's office? No When did it last happen? childhood reaction      If all above answers are "NO", may proceed with cephalosporin use.     Medications Reviewed Today     Reviewed by Beryle Lathe, [facility] (Pharmacist) on 05/09/21 at [phone]  Med List Status: <None>   Medication Order Taking? Sig Documenting Provider Last Dose Status Informant  acetaminophen (TYLENOL) 500 MG tablet [id number] Yes Take 1,000 mg by mouth every 4 (four) hours as needed for mild pain or fever.  [provider] Taking Active Self  albuterol (PROVENTIL HFA) 108 (90 Base) MCG/ACT inhaler [id number] Yes INHALE 2 PUFFS INTO THE LUNGS EVERY 6 HOURS AS NEEDED FOR SHORTNESS OF BREATH OR WHEEZING. Dara Hoyer, FNP Taking Active            Med Note Waldo Laine, Gwenyth Allegra   Thu Apr 18, 2021  9:15 AM) Uses once every morning   albuterol (PROVENTIL) (2.5 MG/3ML) 0.083% nebulizer solution [id number] Yes Take 3 mLs (2.5 mg total) by nebulization every 6 (six) hours as needed  for wheezing or shortness of breath. Soyla Dryer, PA-C Taking Active Self           Med Note Jim Like Apr 18, 2021  9:14 AM) Has used 5-6 times in last month  amLODipine (NORVASC) 10 MG tablet [id number] Yes TAKE 1 TABLET BY MOUTH ONCE A DAY. Noreene Larsson, NP Taking Active   ascorbic acid (VITAMIN C) 500 MG tablet [id number] Yes Take 1 tablet (500 mg total) by mouth daily. Barton Dubois, MD Taking Active   aspirin 81 MG EC tablet [id number] Yes Take 1 tablet (81 mg total) by mouth daily.  Patient taking differently: Take 81 mg by mouth in the morning and at bedtime.   Cheryln Manly, NP Taking  Active Self  atorvastatin (LIPITOR) 40 MG tablet [id number] Yes Take 1 tablet (40 mg total) by mouth every evening. Perlie Mayo, NP Taking Active   Azelastine HCl 137 MCG/SPRAY SOLN [id number] Yes USE 2 SPRAYS IN EACH NOSTRIL TWICE DAILY. Valentina Shaggy, MD Taking Active   budesonide-formoterol [facility]) 160-4.5 MCG/ACT inhaler [id number] Yes Inhale 2 puffs into the lungs 2 (two) times daily. Dara Hoyer, FNP Taking Active            Med Note Waldo Laine, [place] May 09, 2021  9:18 AM)    Cholecalciferol (VITAMIN D3) 125 MCG (5000 UT) CAPS [id number] Yes Take 1 capsule (5,000 Units total) by mouth daily. Cassandria Anger, MD Taking Active   citalopram (CELEXA) 20 MG tablet [id number] Yes Take 20 mg by mouth daily. [provider] Taking Active Self  cycloSPORINE (RESTASIS) 0.05 % ophthalmic emulsion [id number] Yes Place 1 drop into both eyes 2 (two) times daily. [provider] Taking Active Self  Dulaglutide (TRULICITY) 0.27 XA/1.2IN SOPN [id number] Yes Inject 0.75 mg into the skin once a week. Cassandria Anger, MD Taking Active            Med Note Jim Like May 09, 2021  9:19 AM) Dewaine Conger every Tuesday  ferrous sulfate 325 (65 FE) MG tablet [id number] Yes Take 1 tablet (325 mg total) by mouth daily with breakfast. Noreene Larsson, NP Taking Active   furosemide (LASIX) 40 MG tablet [id number] Yes Take 1 tablet (40 mg total) by mouth daily as needed for fluid. Evans Lance, MD Taking Active   glipiZIDE (GLUCOTROL XL) 5 MG 24 hr tablet [id number] Yes TAKE 1 TABLET BY MOUTH DAILY WITH BREAKFAST. Cassandria Anger, MD Taking Active   glucose blood (ACCU-CHEK AVIVA PLUS) test strip [id number]  Use as instructed to check blood glucose once daily, DX E11.59 Cassandria Anger, MD  Active   magnesium gluconate (MAGONATE) 500 MG tablet [id number] Yes Take 500 mg by mouth 2 (two) times daily. [provider] Taking Active   metFORMIN (GLUCOPHAGE) 1000 MG tablet [id number] Yes TAKE 1 TABLET BY MOUTH TWICE DAILY WITH MEALS. Cassandria Anger, MD Taking Active   mometasone (ELOCON) 0.1 % cream [id number] Yes Apply 1 application topically 2 (two) times a week.  [provider] Taking Active Self  nitroGLYCERIN (NITROSTAT) 0.4 MG SL tablet [id number] No Place 1 tablet (0.4 mg total) under the tongue every 5 (five) minutes as needed for chest pain.  Patient not taking: Reported on 05/09/2021   Evans Lance, MD Not Taking Active   olopatadine (PATANOL) 0.1 % ophthalmic solution [id number] Yes 1 drop 2 (two) times daily. [provider] Taking Active   pantoprazole (PROTONIX) 40 MG tablet [id number] Yes Take 1 tablet (40 mg total) by mouth 2 (two) times daily before a meal. Annitta Needs, NP Taking Active   prednisoLONE acetate (PRED FORTE) 1 % ophthalmic suspension [id number] Yes Place 4 drops into both eyes 2 (two) times a week.  [provider] Taking Active Self  Tiotropium Bromide Monohydrate (SPIRIVA RESPIMAT) 2.5 MCG/ACT AERS [id number] Yes Inhale 1 puff into the lungs daily. Dara Hoyer, FNP Taking Active   triamcinolone ointment (KENALOG) 0.1 % [id number] Yes Apply 1 application topically 2 (two) times daily. Valentina Shaggy, MD Taking Active             Patient Active  Problem List   Diagnosis Date Noted  Anxiety 04/24/2021   IDA (iron deficiency anemia) 04/02/2021   Allergic reaction 01/09/2021   Need for immunization against influenza 06/20/2020   Annual visit for general adult medical examination with abnormal findings 06/20/2020   Encounter for screening for malignant neoplasm of prostate 06/20/2020   Seasonal and perennial allergic rhinitis 11/10/2018   Restrictive lung disease 11/10/2018   Cirrhosis of liver ([place]) 11/08/2018   Barrett's esophagus 08/04/2018   Vitamin D deficiency 06/30/2018   Alcohol abuse 06/30/2018   DM type 2 causing vascular disease ([place]) 08/17/2017   Chronic combined systolic and diastolic CHF (congestive heart failure) ([place]) 05/15/2017   Sleep apnea 04/28/2017   Depression with anxiety 04/15/2017   GERD (gastroesophageal reflux disease) 09/23/2016   Asthma-COPD overlap syndrome ([place]) 08/27/2015   Cigarette nicotine dependence with nicotine-induced disorder 08/27/2015   Mixed hyperlipidemia 08/27/2015   Class 1 obesity due to excess calories without serious comorbidity with body mass index (BMI) of 34.0 to 34.9 in adult 59/93/5701   Chronic systolic CHF (congestive heart failure) ([place]) 06/01/2015   CAD (coronary artery disease) 05/31/2015   Current smoker 05/31/2015   Essential hypertension, benign 02/09/2015    Immunization History  Administered Date(s) Administered   Influenza,inj,Quad PF,6+ Mos 07/03/2016, 05/07/2017, 06/20/2020   Influenza-Unspecified 05/17/2018   Moderna Sars-Covid-2 Vaccination 02/20/2020   Pneumococcal Conjugate-13 06/17/2017   Pneumococcal Polysaccharide-23 02/07/2015   Tdap 05/17/2019    Conditions to be addressed/monitored: CHF, CAD, HTN, HLD, COPD, DMII, and smoking cessation  Care Plan : Medication Management  Updates made by Beryle Lathe, [place] since 05/09/2021 12:00 AM     Problem: Diabetes, Hypertension, Coronar Artery Disease, Hyperlipidemia, COPD/Asthma, Smoking  Cessation, Weight Management   Priority: High  Onset Date: 04/18/2021     Long-Range Goal: Disease Progression Prevention   Start Date: 04/18/2021  Expected End Date: 07/17/2021  Recent Progress: On track  Priority: High  Note:   Current Barriers:  Unable to achieve control of diabetes  Pharmacist Clinical Goal(s):  Over the next 90  days, patient will Achieve control of diabetes as evidenced by improved A1c through collaboration with PharmD and provider.   Interventions: 1:1 collaboration with Noreene Larsson, NP regarding development and update of comprehensive plan of care as evidenced by provider attestation and co-signature Inter-disciplinary care team collaboration (see longitudinal plan of care) Comprehensive medication review performed; medication list updated in electronic medical record  Type 2 Diabetes (Sees Endocrinology - Dr. Dorris Fetch): Current medications: metformin 1,000 mg by mouth twice daily meals, glipizide XL 5 mg by mouth with breakfast, Trulicity 7.79 mg subcutaneously once weekly on Tuesday (recently started Trulicity by Dr. Dorris Fetch) Intolerances: none Taking medications as directed: yes Side effects thought to be attributed to current medication regimen: no Denies recent hypoglycemic/hyperglycemic symptoms - notes decrease in urination  Hypoglycemia prevention: not indicated at this time Current meal patterns: has stopped drinking strawberry/peach drinks and slushies; breakfast: cereal, banana sandwich, tasted ham/cheese, and biscuit; lunch: burger and corn dog; dinner: pizza, sub, and stew; snacks: chips and crackers; drinks: water and coffee with sugar Current exercise: goes to [facility] but has not been x1.5 months On a statin: [x]  Yes  []  No    Last microalbumin: 1.8 (11/09/19); on an ACEi/ARB: []  Yes  [x]  No, contraindicated due to angioedema Last eye exam: Feb/March 2021 per patient; next scheduled Oct 2022 Last foot exam: unknown Pneumonia vaccine: up to  date Current glucose readings: notes a blood glucose of 188 post prandial last night which is an  improvement from 200s before starting Trulicity Uncontrolled; Most recent A1c above goal of <7% per ADA guidelines Hypoglycemia: I have discussed with the patient how to treat hypoglycemia by the rule of 15; eat/drink 15g of sugar in the form of glucose tabs, 4 ounces of juice or soda and recheck fingerstick glucose in 15 minutes. Chocolate bars and ice cream should be avoided because fat delays carbohydrate digestion and absorption. Retreat if glucose remains low. Driving should cease until glucose is normal. Need for daily foot inspection and annual eye exam Behavior change: setting realistic goals for initial changes Continue metformin 1,000 mg by mouth twice daily meals and glipizide XL 5 mg by mouth with breakfast Per Dr. Dorris Fetch, will continue to titrate Trulicity; however, will titrate slowly given increased risk of pancreatitis given current cigarette smoking  May be able to discontinue glipizide XL 5 mg by mouth with breakfast once Trulicity fully ramped up and blood glucose improved Instructed to monitor blood sugars three times a day at the following times: fasting (at least 8 hours since last food consumption), 1-2 hours after breakfast, and bedtime Encouraged regular aerobic exercise with a goal of 30 minutes five times per week (150 minutes per week) Patient identified as a good candidate for SGLT-2 inhibitor given reduction in cardiovascular disease, slowed chronic kidney disease progression, low risk of hypoglycemia, weight loss, and improved morbidity and mortality for heart failure with reduced ejection fraction. Patient denies a history of significant genitourinary infections. No concern for hypotension/volume depletion. GFR at least >20 mL/minute/1.73 m2.  Hypertension: Current medications: amlodipine 10 mg by mouth once daily Intolerances: angioedema with lisinopril Taking medications as  directed: yes Side effects thought to be attributed to current medication regimen: no Denies dizziness, lightheadedness, blurred vision, and headache Home blood pressure readings: not checking but does have a machine Has not been using CPAP machine x8 months Blood pressure under good control. Blood pressure is at goal of <130/80 mmHg per 2017 AHA/ACC guidelines. Continue amlodipine 10 mg by mouth once daily Encourage dietary sodium restriction/DASH diet Recommend regular aerobic exercise Recommend home blood pressure monitoring, to bring results in next visit Discussed need for and importance of continued work on weight loss Discussed need for medication compliance Reviewed risks of hypertension, principles of treatment and consequences of untreated hypertension  CAD/Hyperlipidemia (Sees Cardiology - Dr. [name]): Current medications: atorvastatin 40 mg by mouth once daily (reduced from 53m due to elevated liver function tests) Intolerances: none Taking medications as directed: yes Side effects thought to be attributed to current medication regimen: no Uncontrolled; LDL above goal of <55 due to extreme risk given history of premature ASCVD (age <55 years, male; <65 years, male) per 2020 AACE/ACE guidelines and TG at goal of <150 per 2020 AACE/ACE guidelines Continue atorvastatin 40 mg by mouth once daily Consider addition of ezetimibe 10 mg by mouth once daily Recommend regular aerobic exercise Discussed need for and importance of continued work on weight loss Reviewed risks of hyperlipidemia, principles of treatment and consequences of untreated hyperlipidemia Discussed need for medication compliance Re-check lipid panel in 4-12 weeks  Overweight/Obesity Unable to achieve goal weight loss through lifestyle modification alone Current treatment: Trulicity 06.46mg subcutaneously once weekly  Medications/Strategies previously tried: none; Fasting/Modified Fasting Baseline weight: ~240  lbs; most recent weight: 208 lbs Extensive dietary counseling including education on focus on lean proteins, fruits and vegetables, whole grains and increased fiber consumption, adequate hydration Continue GLP-1 agonist Consider addition of SGLT2i as above Recommend diet modification  to induce energy deficit of 500 kcal/day or greater Encouraged regular aerobic exercise with a goal of 30 minutes five times per week (150 minutes per week)  Heart failure with reduced ejection fraction (LVEF <40%) (Sees Cardiology - Dr. [name]): Suboptimally managed; discussed with Dr. [name] and updated echocardiogram to be ordered Current treatment: furosemide 40 mg daily as needed for fluid overload ACEi/ARB/ARNi contraindicated due to angioedema; beta blocker contraindicated due to history of sinus pause and bradycardia Reports inability to take potassium tablets due to size of pill. Reports has not taken x1 year. Potassium level normal. Consider potassium capsule instead of tablet if needed in future. Stage C (Symptomatic heart failure)/NYHA Class II (Slight limitation of physical activity. Comfortable at rest. Ordinary physical activity results in fatigue, palpitation, or dyspnea) Most recent echocardiogram was in 2016 with LVEF 40% Last BNP: 32.6 (2018) Current home blood pressure: not checking Current home weights: has not been checking recently because his battery died  Denies dyspnea at rest or on exertion, unexplained fatigue, swelling in the legs, ankles and feet, swelling of the abdomen, very rapid weight gain from fluid buildup, nausea, vomiting, or lack of appetite, and chest pain. Reports reduced exercise capacity, persistent cough or wheezing, orthopnea, and paroxysmal nocturnal dyspnea After repeat echocardiogram, consider addition of SGLT2i +/- spironolactone given contraindication to other guideline directed medication therapy Encourage dietary sodium restriction (<3 g/day) Educated on the  importance of weighing daily. Patient aware to contact cardiology/primary care team if weight gain >3 lbs in 1 day or >5 lbs in 1 week Discussed need for and importance of continued work on weight loss  Chronic Obstructive Pulmonary Disease/Asthma/Smoking Cessation (Sees Pulmonology - Dr. [name]): Uncontrolled; patient has cut back to half pack per day from 1 pack per day - congratulated on efforts  Currently smokes half pack per day and wants to cut back; has tried the nicotine patch previously and is willing to try Chantix Current treatment: tiotropium (Spiriva Respimat) 1 inhalation once daily and budesonide/formoterol (Symbicort HFA) 160-4.5 mcg 2 puffs by mouth twice daily Reports he has also uses albuterol inhaler and nebulizer as needed Most recent Pulmonary Function Testing: 01/11/21 follows with Dr. [name] 0 exacerbations requiring treatment in the last 6 months  Current oxygen requirements: none Continue tiotropium (Spiriva Respimat) 1 inhalation once daily and budesonide/formoterol (Symbicort HFA) 160-4.5 mcg 2 puffs by mouth twice daily Discussed need for medication compliance Patient counseled on smoking cessation. Recommend initiating Chantix to assist patient quit smoking. Patient provided medication counseling for Chantix Reminded patient to rinse mouth with water and spit after using ICS containing inhaler to prevent thrush Recommend allergen avoidance (environmental control)  Patient Goals/Self-Care Activities Over the next 90 days, patient will:  Take medications as prescribed Focus on medication adherence by keeping up with prescription refills and either using a pill box or reminders to take your medications at the prescribed times Check blood sugar three times a day at the following times: fasting (at least 8 hours since last food consumption), 1-2 hours after breakfast, bedtime, and whenever patient experiences symptoms of hypo/hyperglycemia, document, and provide at  future appointments Check blood pressure at least once daily, document, and provide at future appointments Weigh daily, and contact provider if weight gain of more than 3 lbs in 1 day or more than 5 lbs in 1 week Target a minimum of 150 minutes of moderate intensity exercise weekly Engage in dietary modifications by fewer sweetened foods & beverages, better food choices, and watch portion sizes/amount  of food eaten at one time  Follow Up Plan: Telephone follow up appointment with care management team member scheduled for: 05/22/21     Medication Assistance: None required.  Patient affirms current coverage meets needs.  Patient's preferred pharmacy is:  [facility], [address] [place] [place] [address] Phone: [phone] Fax: [phone]  Follow Up:  Patient agrees to Care Plan and Follow-up.  Plan: Telephone follow up appointment with care management team member scheduled for:  05/22/21  Kennon Holter, PharmD Clinical Pharmacist [facility] [phone]

## 2021-05-09 NOTE — Patient Instructions (Addendum)
Randy Hebert,  It was great to talk to you today! After talking to your PCP, we decided to send in a prescription for Chantix for you to help you stop smoking. This will decrease the "pleasure" response you have from smoking. Follow the instructions in the box. Always take with food to decrease nausea.   Please call me with any questions or concerns.   Visit Information  PATIENT GOALS:  Goals Addressed             This Visit's Progress    Medication Management       Patient Goals/Self-Care Activities Over the next 90 days, patient will:  Take medications as prescribed Focus on medication adherence by keeping up with prescription refills and either using a pill box or reminders to take your medications at the prescribed times Check blood sugar three times a day at the following times: fasting (at least 8 hours since last food consumption), 1-2 hours after breakfast, bedtime, and whenever patient experiences symptoms of hypo/hyperglycemia, document, and provide at future appointments Check blood pressure at least once daily, document, and provide at future appointments Weigh daily, and contact provider if weight gain of more than 3 lbs in 1 day or more than 5 lbs in 1 week Target a minimum of 150 minutes of moderate intensity exercise weekly Engage in dietary modifications by fewer sweetened foods & beverages, better food choices, and watch portion sizes/amount of food eaten at one time         The patient verbalized understanding of instructions, educational materials, and care plan provided today and declined offer to receive copy of patient instructions, educational materials, and care plan.   Telephone follow up appointment with care management team member scheduled for:05/22/21  Kennon Holter, PharmD Clinical Pharmacist [facility] [phone]

## 2021-05-09 NOTE — Addendum Note (Signed)
Addended by: Beryle Lathe on: 05/09/2021 03:10 PM   Modules accepted: Orders

## 2021-05-13 ENCOUNTER — Encounter: Payer: Self-pay | Admitting: Nurse Practitioner

## 2021-05-13 ENCOUNTER — Ambulatory Visit (INDEPENDENT_AMBULATORY_CARE_PROVIDER_SITE_OTHER): Payer: Medicare Other | Admitting: Nurse Practitioner

## 2021-05-13 ENCOUNTER — Ambulatory Visit (HOSPITAL_COMMUNITY)
Admission: RE | Admit: 2021-05-13 | Discharge: 2021-05-13 | Disposition: A | Discharge status: Home / Self Care | Payer: Medicare Other | Source: Ambulatory Visit | Attending: Nurse Practitioner | Admitting: Nurse Practitioner

## 2021-05-13 ENCOUNTER — Other Ambulatory Visit: Payer: Self-pay

## 2021-05-13 VITALS — BP 116/74 | HR 80 | Temp 98.3°F | Ht 65.0 in | Wt 207.0 lb

## 2021-05-13 DIAGNOSIS — J441 Chronic obstructive pulmonary disease with (acute) exacerbation: Secondary | ICD-10-CM | POA: Diagnosis not present

## 2021-05-13 DIAGNOSIS — R059 Cough, unspecified: Secondary | ICD-10-CM | POA: Diagnosis not present

## 2021-05-13 MED ORDER — DOXYCYCLINE HYCLATE 100 MG PO TABS
100.0000 mg | ORAL_TABLET | Freq: Two times a day (BID) | ORAL | 0 refills | Status: DC
Start: 2021-05-13 — End: 2021-05-23

## 2021-05-13 NOTE — Progress Notes (Signed)
The CXR showed an opacity at the base of the right lung, so the antibiotics should help out. If no improvement in 7 days, return to clinic.

## 2021-05-13 NOTE — Progress Notes (Signed)
Acute Office Visit  Subjective:    Patient ID: Randy Hebert, male    DOB: December 21, 1965, 55 y.o.   MRN: [id number]  Chief Complaint  Patient presents with   Hoarse    Ongoing x2 weeks   Cough    Ongoing x2 weeks, productive. Also shortness of breath.    Cough Associated symptoms include shortness of breath. Pertinent negatives include no chills, fever or sore throat.  Patient is in today for sick visit.   Past Medical History:  Diagnosis Date   ACE inhibitor-aggravated angioedema    Allergy    Angio-edema    Anxiety    Arthritis    Asthma    hip replacement   Back pain    Bradycardia 04/28/2017   Bulging of cervical intervertebral disc    CAD (coronary artery disease)    lateral STEMI 02/06/2015 00% D1 occlusion treated with Promus Premier 2.5 mm x 16 mm DES, 70% ramus stenosis, 40% mid RCA stenosis, 45% distal RCA stenosis, EF 45-50%   CHF (congestive heart failure) (HCC)    COPD (chronic obstructive pulmonary disease) ([place])    Depression    Diabetes mellitus without complication ([place])    Difficult intubation    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   Dry eye    Dyspnea    Early satiety 09/23/2016   GERD (gastroesophageal reflux disease)    HCAP (healthcare-associated pneumonia) 05/15/2017   Headache    Heart murmur    Hip pain    Hyperlipidemia    Hypertension    Lobar pneumonia ([place]) 05/15/2017   Melena 08/04/2018   MI (myocardial infarction) ([place])    Myocardial infarction ([place])    Neck pain    Non-ST elevation (NSTEMI) myocardial infarction ([place]) 04/27/2017   NSTEMI (non-ST elevated myocardial infarction) ([place]) 04/26/2017   Otitis media    Pleurisy    Pneumonia due to COVID-19 virus 10/07/2019   Rectal bleeding 11/08/2018   Right shoulder pain 03/20/2020   Sinus pause    9 sec sinus pause on telemetry after started on coreg after MI, avoid AV nodal blocking agent   Sleep apnea    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   STEMI (ST  elevation myocardial infarction) ([place]) 05/31/2015   Substance abuse ([place]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   Syncope 27/01/2375   Syncope and collapse 05/15/2017   Transaminitis 08/04/2018   Unilateral primary osteoarthritis, right hip 07/01/2016    Past Surgical History:  Procedure Laterality Date   BIOPSY  10/09/2016   Procedure: BIOPSY;  Surgeon: Daneil Dolin, MD;  [facility];  Service: Endoscopy;;   BIOPSY  11/28/2019   Procedure: BIOPSY;  Surgeon: Daneil Dolin, MD;  [facility];  Service: Endoscopy;;   CARDIAC CATHETERIZATION N/A 02/06/2015   Procedure: Left Heart Cath and Coronary Angiography;  Surgeon: Leonie Man, MD;  Location: [facility];  Service: Cardiovascular;  Laterality: N/A;   CARDIAC CATHETERIZATION N/A 02/06/2015   Procedure: Coronary Stent Intervention;  Surgeon: Leonie Man, MD;  Location: [facility];  Service: Cardiovascular;  Laterality: N/A;   COLONOSCOPY WITH PROPOFOL N/A 10/09/2016   Sigmoid and descending colon diverticulosis, four 4-6 mm polyps in sigmoid, one 4 mm polyp in descending. Tubular adenomas and hyperplastic. 5 year surveillance.    COLONOSCOPY WITH PROPOFOL N/A 11/24/2016   Sigmoid and descending colon diverticulosis, four 4-6 mm polyps in sigmoid, one 4 mm polyp  in descending. Tubular adenomas and hyperplastic. 5 year surveillance.    CORONARY ANGIOPLASTY WITH STENT PLACEMENT  01/2015   CORONARY STENT INTERVENTION N/A 04/27/2017   Procedure: CORONARY STENT INTERVENTION;  Surgeon: Nelva Bush, MD;  Location: [facility];  Service: Cardiovascular;  Laterality: N/A;   ELECTROPHYSIOLOGY STUDY N/A 06/29/2017   Procedure: ELECTROPHYSIOLOGY STUDY;  Surgeon: Evans Lance, MD;  Location: [facility];  Service: Cardiovascular;  Laterality: N/A;   ESOPHAGOGASTRODUODENOSCOPY (EGD) WITH PROPOFOL N/A 10/09/2016   Dr. Gala Romney: LA grade a esophagitis.  Barrett's esophagus, biopsy-proven.  Small hiatal hernia.  EGD February  2019   ESOPHAGOGASTRODUODENOSCOPY (EGD) WITH PROPOFOL N/A 11/28/2019    salmon-colored esophageal mucosa (Barrett's) small hiatal hernia, portal hypertensive gastropathy, normal duodenum, 3 year surveillance   INCISION / DRAINAGE HAND / FINGER     LEFT HEART CATH AND CORONARY ANGIOGRAPHY N/A 04/27/2017   Procedure: LEFT HEART CATH AND CORONARY ANGIOGRAPHY;  Surgeon: Nelva Bush, MD;  Location: [facility];  Service: Cardiovascular;  Laterality: N/A;   LOOP RECORDER INSERTION  06/29/2017   Procedure: Loop Recorder Insertion;  Surgeon: Evans Lance, MD;  Location: [facility];  Service: Cardiovascular;;   POLYPECTOMY  11/24/2016   Procedure: POLYPECTOMY;  Surgeon: Daneil Dolin, MD;  [facility];  Service: Endoscopy;;  descending and sigmoid   TOTAL HIP ARTHROPLASTY Right 07/01/2016   TOTAL HIP ARTHROPLASTY Right 07/01/2016   Procedure: RIGHT TOTAL HIP ARTHROPLASTY ANTERIOR APPROACH;  Surgeon: Mcarthur Rossetti, MD;  Location: [facility];  Service: Orthopedics;  Laterality: Right;    Family History  Problem Relation Age of Onset   Heart attack Father    Stroke Father    Arthritis Father    Heart disease Father    Cancer Mother        ???   Arthritis Mother    Heart disease Brother 47       died in sleep   Early death Brother    Diabetes Maternal Uncle    Alzheimer's disease Maternal Grandmother     Social History   Socioeconomic History   Marital status: Divorced    Spouse name: alice   Number of children: 3   Years of education: 12   Highest education level: Not on file  Occupational History   Occupation: SSI  Tobacco Use   Smoking status: Every Day    Packs/day: 1.00    Years: 46.00    Pack years: 46.00    Types: Cigarettes   Smokeless tobacco: Former    Types: Nurse, children's Use: Former  Substance and Sexual Activity   Alcohol use: Yes    Comment: couple beers occ   Drug use: No    Comment: history of drug use-  marijuana, cocaine- quit 2014   Sexual activity: Yes    Partners: Female    Birth control/protection: None  Other Topics Concern   Not on file  Social History Narrative   Lives with girlfriend [name]   3 children, but 1 OD  In 2019.   Grandchildren-2      Two cats: may and princess; and a dog-foxy      Enjoy: spending time with pets, TV, yard work       Diet: eats all food groups   Caffeine: half and half coffee, some tea-half and half decaf   Water: 6-8 cups daily      Wears seat belt  Does not use phone while driving   Smoke detectors at home      Social Determinants of Health   Financial Resource Strain: Low Risk    Difficulty of Paying Living Expenses: Not hard at all  Food Insecurity: No Food Insecurity   Worried About Charity fundraiser in the Last Year: Never true   Arboriculturist in the Last Year: Never true  Transportation Needs: No Transportation Needs   Lack of Transportation (Medical): No   Lack of Transportation (Non-Medical): No  Physical Activity: Insufficiently Active   Days of Exercise per Week: 3 days   Minutes of Exercise per Session: 20 min  Stress: No Stress Concern Present   Feeling of Stress : Not at all  Social Connections: Moderately Integrated   Frequency of Communication with Friends and Family: More than three times a week   Frequency of Social Gatherings with Friends and Family: More than three times a week   Attends Religious Services: More than 4 times per year   Active Member of [employer] or Organizations: Yes   Attends Music therapist: More than 4 times per year   Marital Status: Divorced  Human resources officer Violence: Not At Risk   Fear of Current or Ex-Partner: No   Emotionally Abused: No   Physically Abused: No   Sexually Abused: No    Outpatient Medications Prior to Visit  Medication Sig Dispense Refill   acetaminophen (TYLENOL) 500 MG tablet Take 1,000 mg by mouth every 4 (four) hours as needed for mild pain or  fever.      albuterol (PROVENTIL HFA) 108 (90 Base) MCG/ACT inhaler INHALE 2 PUFFS INTO THE LUNGS EVERY 6 HOURS AS NEEDED FOR SHORTNESS OF BREATH OR WHEEZING. 18 g 1   albuterol (PROVENTIL) (2.5 MG/3ML) 0.083% nebulizer solution Take 3 mLs (2.5 mg total) by nebulization every 6 (six) hours as needed for wheezing or shortness of breath. 150 mL 1   amLODipine (NORVASC) 10 MG tablet TAKE 1 TABLET BY MOUTH ONCE A DAY. 90 tablet 0   ascorbic acid (VITAMIN C) 500 MG tablet Take 1 tablet (500 mg total) by mouth daily. 30 tablet 1   aspirin 81 MG EC tablet Take 1 tablet (81 mg total) by mouth daily. (Patient taking differently: Take 81 mg by mouth in the morning and at bedtime.) 30 tablet    atorvastatin (LIPITOR) 40 MG tablet Take 1 tablet (40 mg total) by mouth every evening. 30 tablet 6   Azelastine HCl 137 MCG/SPRAY SOLN USE 2 SPRAYS IN EACH NOSTRIL TWICE DAILY. 30 mL 0   budesonide-formoterol (SYMBICORT) 160-4.5 MCG/ACT inhaler Inhale 2 puffs into the lungs 2 (two) times daily. 10.2 g 5   Cholecalciferol (VITAMIN D3) 125 MCG (5000 UT) CAPS Take 1 capsule (5,000 Units total) by mouth daily. 90 capsule 0   citalopram (CELEXA) 20 MG tablet Take 20 mg by mouth daily.     cycloSPORINE (RESTASIS) 0.05 % ophthalmic emulsion Place 1 drop into both eyes 2 (two) times daily.     Dulaglutide (TRULICITY) 1.76 HY/0.7PX SOPN Inject 0.75 mg into the skin once a week. 2 mL 2   ferrous sulfate 325 (65 FE) MG tablet Take 1 tablet (325 mg total) by mouth daily with breakfast. 90 tablet 1   furosemide (LASIX) 40 MG tablet Take 1 tablet (40 mg total) by mouth daily as needed for fluid. 30 tablet 11   glipiZIDE (GLUCOTROL XL) 5 MG 24 hr tablet TAKE 1  TABLET BY MOUTH DAILY WITH BREAKFAST. 90 tablet 0   glucose blood (ACCU-CHEK AVIVA PLUS) test strip Use as instructed to check blood glucose once daily, DX E11.59 100 each 12   magnesium gluconate (MAGONATE) 500 MG tablet Take 500 mg by mouth 2 (two) times daily.     metFORMIN  (GLUCOPHAGE) 1000 MG tablet TAKE 1 TABLET BY MOUTH TWICE DAILY WITH MEALS. 180 tablet 0   mometasone (ELOCON) 0.1 % cream Apply 1 application topically 2 (two) times a week.      nitroGLYCERIN (NITROSTAT) 0.4 MG SL tablet Place 1 tablet (0.4 mg total) under the tongue every 5 (five) minutes as needed for chest pain. 25 tablet 3   olopatadine (PATANOL) 0.1 % ophthalmic solution 1 drop 2 (two) times daily.     pantoprazole (PROTONIX) 40 MG tablet Take 1 tablet (40 mg total) by mouth 2 (two) times daily before a meal. 60 tablet 3   prednisoLONE acetate (PRED FORTE) 1 % ophthalmic suspension Place 4 drops into both eyes 2 (two) times a week.      Tiotropium Bromide Monohydrate (SPIRIVA RESPIMAT) 2.5 MCG/ACT AERS Inhale 1 puff into the lungs daily. 4 g 5   triamcinolone ointment (KENALOG) 0.1 % Apply 1 application topically 2 (two) times daily. 454 g 1   varenicline (CHANTIX STARTING MONTH PAK) 0.5 MG X 11 & 1 MG X 42 tablet Take one 0.5 mg tablet by mouth once daily for 3 days, then increase to one 0.5 mg tablet twice daily for 4 days, then increase to one 1 mg tablet twice daily. Take with food. (Patient not taking: Reported on 05/13/2021) 1 each 0   No facility-administered medications prior to visit.    Allergies  Allergen Reactions   Carvedilol Other (See Comments)    Sinus pause on telemetry >3 seconds. Longest one 9 sec. No AV nodal agent   Lisinopril Anaphylaxis, Shortness Of Breath and Swelling    Angioedema, required intubation and mechanical ventilation   Amoxicillin Nausea And Vomiting and Nausea Only    Nausea only - no allergy  Did it involve swelling of the face/tongue/throat, SOB, or low BP? No Did it involve sudden or severe rash/hives, skin peeling, or any reaction on the inside of your mouth or nose? No Did you need to seek medical attention at a hospital or doctor's office? No When did it last happen? childhood reaction      If all above answers are "NO", may proceed with  cephalosporin use.     Review of Systems  Constitutional:  Negative for chills and fever.  HENT:  Positive for congestion. Negative for sinus pressure, sinus pain and sore throat.   Respiratory:  Positive for cough and shortness of breath.   Cardiovascular: Negative.       Objective:    Physical Exam Constitutional:      Appearance: Normal appearance. He is obese.  Cardiovascular:     Rate and Rhythm: Normal rate and regular rhythm.     Pulses: Normal pulses.     Heart sounds: Normal heart sounds.  Pulmonary:     Effort: Pulmonary effort is normal.     Breath sounds: Normal breath sounds.  Neurological:     Mental Status: He is alert.  Psychiatric:        Mood and Affect: Mood normal.        Behavior: Behavior normal.        Thought Content: Thought content normal.  Judgment: Judgment normal.    BP 116/74 (BP Location: Left Arm, Patient Position: Sitting, Cuff Size: Large)   Pulse 80   Temp 98.3 F (36.8 C) (Oral)   Ht 5' 5"  (1.651 m)   Wt 207 lb (93.9 kg)   SpO2 91%   BMI 34.45 kg/m  Wt Readings from Last 3 Encounters:  05/13/21 207 lb (93.9 kg)  05/02/21 206 lb 3.2 oz (93.5 kg)  04/24/21 206 lb (93.4 kg)    Health Maintenance Due  Topic Date Due   URINE MICROALBUMIN  11/08/2020   OPHTHALMOLOGY EXAM  02/15/2021    There are no preventive care reminders to display for this patient.   Lab Results  Component Value Date   TSH 3.61 06/21/2020   Lab Results  Component Value Date   WBC 7.2 04/11/2021   HGB 15.0 04/11/2021   HCT 44.4 04/11/2021   MCV 87 04/11/2021   PLT 180 04/11/2021   Lab Results  Component Value Date   NA 138 04/11/2021   K 4.4 04/11/2021   CO2 26 04/11/2021   GLUCOSE 189 (H) 04/11/2021   BUN 8 04/11/2021   CREATININE 0.70 (L) 04/11/2021   BILITOT 0.9 04/11/2021   ALKPHOS 145 (H) 04/11/2021   AST 28 04/11/2021   ALT 33 04/11/2021   PROT 7.0 04/11/2021   ALBUMIN 4.2 04/11/2021   CALCIUM 9.7 04/11/2021   ANIONGAP 8  10/11/2019   EGFR 108 04/11/2021   Lab Results  Component Value Date   CHOL 124 04/11/2021   Lab Results  Component Value Date   HDL 28 (L) 04/11/2021   Lab Results  Component Value Date   LDLCALC 74 04/11/2021   Lab Results  Component Value Date   TRIG 118 04/11/2021   Lab Results  Component Value Date   CHOLHDL 7.1 (H) 06/21/2020   Lab Results  Component Value Date   HGBA1C 9.0 (H) 04/11/2021       Assessment & Plan:   Problem List Items Addressed This Visit       Respiratory   COPD exacerbation ([place]) - Primary    -ongoing x2 weeks -Rx. Prednisone and augmentin -CXR today      Relevant Medications   doxycycline (VIBRA-TABS) 100 MG tablet   Other Relevant Orders   Novel Coronavirus, NAA ([facility])   DG Chest 2 View     Meds ordered this encounter  Medications   doxycycline (VIBRA-TABS) 100 MG tablet    Sig: Take 1 tablet (100 mg total) by mouth 2 (two) times daily.    Dispense:  20 tablet    Refill:  0      Noreene Larsson, NP

## 2021-05-13 NOTE — Assessment & Plan Note (Signed)
-  ongoing x2 weeks -Rx. Prednisone and augmentin -CXR today

## 2021-05-15 ENCOUNTER — Ambulatory Visit (INDEPENDENT_AMBULATORY_CARE_PROVIDER_SITE_OTHER): Payer: Medicare Other | Admitting: *Deleted

## 2021-05-15 DIAGNOSIS — J309 Allergic rhinitis, unspecified: Secondary | ICD-10-CM

## 2021-05-15 LAB — SARS-COV-2, NAA 2 DAY TAT

## 2021-05-15 LAB — NOVEL CORONAVIRUS, NAA: SARS-CoV-2, NAA: NOT DETECTED

## 2021-05-15 NOTE — Progress Notes (Signed)
Covid negative. No need to change meds.

## 2021-05-17 ENCOUNTER — Ambulatory Visit (HOSPITAL_COMMUNITY)
Admission: RE | Admit: 2021-05-17 | Discharge: 2021-05-17 | Disposition: A | Discharge status: Home / Self Care | Payer: Medicare Other | Source: Ambulatory Visit | Attending: Cardiology | Admitting: Cardiology

## 2021-05-17 ENCOUNTER — Other Ambulatory Visit: Payer: Self-pay

## 2021-05-17 DIAGNOSIS — I2510 Atherosclerotic heart disease of native coronary artery without angina pectoris: Secondary | ICD-10-CM | POA: Insufficient documentation

## 2021-05-17 LAB — ECHOCARDIOGRAM COMPLETE
Area-P 1/2: 5.93 cm2
S' Lateral: 3.4 cm
Single Plane A4C EF: 61.2 %

## 2021-05-17 NOTE — Progress Notes (Signed)
  Echocardiogram 2D Echocardiogram has been performed.  Randy Hebert 05/17/2021, 3:15 PM

## 2021-05-22 ENCOUNTER — Ambulatory Visit: Payer: Medicare Other | Admitting: Pharmacist

## 2021-05-22 DIAGNOSIS — I5022 Chronic systolic (congestive) heart failure: Secondary | ICD-10-CM

## 2021-05-22 DIAGNOSIS — F17219 Nicotine dependence, cigarettes, with unspecified nicotine-induced disorders: Secondary | ICD-10-CM

## 2021-05-22 DIAGNOSIS — I10 Essential (primary) hypertension: Secondary | ICD-10-CM

## 2021-05-22 DIAGNOSIS — I2510 Atherosclerotic heart disease of native coronary artery without angina pectoris: Secondary | ICD-10-CM

## 2021-05-22 DIAGNOSIS — E782 Mixed hyperlipidemia: Secondary | ICD-10-CM

## 2021-05-22 DIAGNOSIS — I5042 Chronic combined systolic (congestive) and diastolic (congestive) heart failure: Secondary | ICD-10-CM

## 2021-05-22 DIAGNOSIS — E1159 Type 2 diabetes mellitus with other circulatory complications: Secondary | ICD-10-CM

## 2021-05-22 DIAGNOSIS — J449 Chronic obstructive pulmonary disease, unspecified: Secondary | ICD-10-CM

## 2021-05-22 DIAGNOSIS — E6609 Other obesity due to excess calories: Secondary | ICD-10-CM

## 2021-05-22 DIAGNOSIS — Z6834 Body mass index (BMI) 34.0-34.9, adult: Secondary | ICD-10-CM

## 2021-05-22 NOTE — Chronic Care Management (AMB) (Signed)
Chronic Care Management Pharmacy Note  05/22/2021 Name:  Randy Hebert MRN:  [id number] DOB:  1965/09/26  Summary:  Heart failure  Most recent echocardiogram was completed 05/17/21 with LVEF 45-50% which was slightly improved from 2016 (LVEF ~40%) Consider addition of SGLT2i +/- spironolactone given contraindication to other guideline directed medication therapy. ACEi/ARB/ARNi contraindicated due to angioedema; beta blocker contraindicated due to history of sinus pause and bradycardia. Will discuss with Dr. [name].  Smoking cessation Patient reports that he currently smokes half pack per day. He has switched to "ultra light" cigarettes. He wants to continue to work on cutting back so he can quit. He was unable to get Chantix from his pharmacy due to an unknown reason. Called pharmacy to clarify and they said they did not have it for him last time he was there because they were out of stock and had to order it; however, it is ready for pickup now for a $0 copay. Patient made aware.  Patient counseled on smoking cessation. Recommend initiating Chantix to assist patient quit smoking.  COPD Recent COPD exacerbation not requiring hospitalization about 2 weeks ago. Opacity at the base of right lung on chest x-ray. Patient reports he is nearing completion of his course of antibiotics. He reports feeling better but is not back to 100%.  Subjective: Randy Hebert is an 55 y.o. year old male who is a primary patient of Noreene Larsson, NP.  The CCM team was consulted for assistance with disease management and care coordination needs.    Engaged with patient by telephone for follow up visit in response to provider referral for pharmacy case management and/or care coordination services.   Consent to Services:  The patient was given information about Chronic Care Management services, agreed to services, and gave verbal consent prior to initiation of services.  Please see initial visit note for  detailed documentation.   Patient Care Team: Noreene Larsson, NP as PCP - General (Nurse Practitioner) Evans Lance, MD as PCP - Electrophysiology (Cardiology) Arnoldo Lenis, MD as PCP - Cardiology (Cardiology) Beryle Lathe, [facility] (Pharmacist)  Objective:  Lab Results  Component Value Date   CREATININE 0.70 (L) 04/11/2021   CREATININE 0.64 (L) 10/24/2020   CREATININE 0.62 (L) 10/11/2020    Lab Results  Component Value Date   HGBA1C 9.0 (H) 04/11/2021   Last diabetic Eye exam:  Lab Results  Component Value Date/Time   HMDIABEYEEXA No Retinopathy 02/16/2020 12:00 AM    Last diabetic Foot exam: No results found for: HMDIABFOOTEX      Component Value Date/Time   CHOL 124 04/11/2021 0819   TRIG 118 04/11/2021 0819   HDL 28 (L) 04/11/2021 0819   CHOLHDL 7.1 (H) 06/21/2020 0707   VLDL 41 (H) 04/27/2017 0505   LDLCALC 74 04/11/2021 0819   LDLCALC 116 (H) 06/21/2020 0707    Hepatic Function Latest Ref Rng & Units 04/11/2021 10/24/2020 10/11/2020  Total Protein 6.0 - 8.5 g/dL 7.0 7.3 7.4  Albumin 3.8 - 4.9 g/dL 4.2 4.2 4.4  AST 0 - 40 IU/L 28 28 48(H)  ALT 0 - 44 IU/L 33 20 30  Alk Phosphatase 44 - 121 IU/L 145(H) 164(H) 169(H)  Total Bilirubin 0.0 - 1.2 mg/dL 0.9 1.0 1.0  Bilirubin, Direct 0.0 - 0.2 mg/dL - - -    Lab Results  Component Value Date/Time   TSH 3.61 06/21/2020 07:07 AM   TSH 1.58 11/03/2018 07:05 AM   FREET4 1.5  11/03/2018 07:05 AM   FREET4 1.4 11/17/2017 07:09 AM    CBC Latest Ref Rng & Units 04/11/2021 10/24/2020 06/21/2020  WBC 3.4 - 10.8 x10E3/uL 7.2 6.0 7.5  Hemoglobin 13.0 - 17.7 g/dL 15.0 11.8(L) 15.2  Hematocrit 37.5 - 51.0 % 44.4 39.1 45.6  Platelets 150 - 450 x10E3/uL 180 296 228    Lab Results  Component Value Date/Time   VD25OH 36 06/21/2020 07:07 AM   VD25OH 31 11/09/2019 07:14 AM    Clinical ASCVD: Yes  The ASCVD Risk score (Arnett DK, et al., 2019) failed to calculate for the following reasons:   The valid total  cholesterol range is 130 to 320 mg/dL    Social History   Tobacco Use  Smoking Status Every Day   Packs/day: 1.00   Years: 46.00   Pack years: 46.00   Types: Cigarettes  Smokeless Tobacco Former   Types: Chew   BP Readings from Last 3 Encounters:  05/13/21 116/74  05/02/21 (!) 143/78  04/24/21 135/78   Pulse Readings from Last 3 Encounters:  05/13/21 80  05/02/21 92  04/24/21 85   Wt Readings from Last 3 Encounters:  05/13/21 207 lb (93.9 kg)  05/02/21 206 lb 3.2 oz (93.5 kg)  04/24/21 206 lb (93.4 kg)    Assessment: Review of patient past medical history, allergies, medications, health status, including review of consultants reports, laboratory and other test data, was performed as part of comprehensive evaluation and provision of chronic care management services.   SDOH:  (Social Determinants of Health) assessments and interventions performed:    CCM Care Plan  Allergies  Allergen Reactions   Carvedilol Other (See Comments)    Sinus pause on telemetry >3 seconds. Longest one 9 sec. No AV nodal agent   Lisinopril Anaphylaxis, Shortness Of Breath and Swelling    Angioedema, required intubation and mechanical ventilation   Amoxicillin Nausea And Vomiting and Nausea Only    Nausea only - no allergy  Did it involve swelling of the face/tongue/throat, SOB, or low BP? No Did it involve sudden or severe rash/hives, skin peeling, or any reaction on the inside of your mouth or nose? No Did you need to seek medical attention at a hospital or doctor's office? No When did it last happen? childhood reaction      If all above answers are "NO", may proceed with cephalosporin use.     Medications Reviewed Today     Reviewed by Beryle Lathe, [facility] (Pharmacist) on 05/22/21 at [phone]  Med List Status: <None>   Medication Order Taking? Sig Documenting Provider Last Dose Status Informant  acetaminophen (TYLENOL) 500 MG tablet [id number] Yes Take 1,000 mg by mouth every 4  (four) hours as needed for mild pain or fever.  [provider] Taking Active Self  albuterol (PROVENTIL HFA) 108 (90 Base) MCG/ACT inhaler [id number] Yes INHALE 2 PUFFS INTO THE LUNGS EVERY 6 HOURS AS NEEDED FOR SHORTNESS OF BREATH OR WHEEZING. Dara Hoyer, FNP Taking Active            Med Note Waldo Laine, Gwenyth Allegra   Thu Apr 18, 2021  9:15 AM) Uses once every morning   albuterol (PROVENTIL) (2.5 MG/3ML) 0.083% nebulizer solution [id number] Yes Take 3 mLs (2.5 mg total) by nebulization every 6 (six) hours as needed for wheezing or shortness of breath. Noreene Larsson, NP Taking Active   amLODipine (NORVASC) 10 MG tablet [id number] Yes TAKE 1 TABLET BY MOUTH ONCE A DAY. Noreene Larsson,  NP Taking Active   ascorbic acid (VITAMIN C) 500 MG tablet [id number] Yes Take 1 tablet (500 mg total) by mouth daily. Barton Dubois, MD Taking Active   aspirin 81 MG EC tablet [id number] Yes Take 1 tablet (81 mg total) by mouth daily.  Patient taking differently: Take 81 mg by mouth in the morning and at bedtime.   Cheryln Manly, NP Taking Active Self  atorvastatin (LIPITOR) 40 MG tablet [id number] Yes Take 1 tablet (40 mg total) by mouth every evening. Perlie Mayo, NP Taking Active   Azelastine HCl 137 MCG/SPRAY SOLN [id number] Yes USE 2 SPRAYS IN EACH NOSTRIL TWICE DAILY. Valentina Shaggy, MD Taking Active   budesonide-formoterol [facility]) 160-4.5 MCG/ACT inhaler [id number] Yes Inhale 2 puffs into the lungs 2 (two) times daily. Dara Hoyer, FNP Taking Active            Med Note Waldo Laine, [place] May 09, 2021  9:18 AM)    Cholecalciferol (VITAMIN D3) 125 MCG (5000 UT) CAPS [id number] Yes Take 1 capsule (5,000 Units total) by mouth daily. Cassandria Anger, MD Taking Active   citalopram (CELEXA) 20 MG tablet [id number] Yes Take 20 mg by mouth daily. [provider] Taking Active Self  cycloSPORINE (RESTASIS) 0.05 % ophthalmic emulsion [id number] Yes Place 1 drop into both  eyes 2 (two) times daily. [provider] Taking Active Self  doxycycline (VIBRA-TABS) 100 MG tablet [id number] Yes Take 1 tablet (100 mg total) by mouth 2 (two) times daily. Noreene Larsson, NP Taking Active   Dulaglutide (TRULICITY) 2.33 AQ/7.6AU [facility] [id number] Yes Inject 0.75 mg into the skin once a week. Cassandria Anger, MD Taking Active            Med Note Jim Like May 09, 2021  9:19 AM) Dewaine Conger every Tuesday  ferrous sulfate 325 (65 FE) MG tablet [id number] Yes Take 1 tablet (325 mg total) by mouth daily with breakfast. Noreene Larsson, NP Taking Active   furosemide (LASIX) 40 MG tablet [id number] Yes Take 1 tablet (40 mg total) by mouth daily as needed for fluid. Evans Lance, MD Taking Active   glipiZIDE (GLUCOTROL XL) 5 MG 24 hr tablet [id number] Yes TAKE 1 TABLET BY MOUTH DAILY WITH BREAKFAST. Cassandria Anger, MD Taking Active   glucose blood (ACCU-CHEK AVIVA PLUS) test strip [id number]  Use as instructed to check blood glucose once daily, DX E11.59 Cassandria Anger, MD  Active   magnesium gluconate (MAGONATE) 500 MG tablet [id number] Yes Take 500 mg by mouth 2 (two) times daily. [provider] Taking Active   metFORMIN (GLUCOPHAGE) 1000 MG tablet [id number] Yes TAKE 1 TABLET BY MOUTH TWICE DAILY WITH MEALS. Cassandria Anger, MD Taking Active   mometasone (ELOCON) 0.1 % cream [id number] Yes Apply 1 application topically 2 (two) times a week.  [provider] Taking Active Self  nitroGLYCERIN (NITROSTAT) 0.4 MG SL tablet [id number] No Place 1 tablet (0.4 mg total) under the tongue every 5 (five) minutes as needed for chest pain.  Patient not taking: Reported on 05/22/2021   Evans Lance, MD Not Taking Active   olopatadine (PATANOL) 0.1 % ophthalmic solution [id number] Yes 1 drop 2 (two) times daily. [provider] Taking Active   pantoprazole (PROTONIX) 40 MG tablet [id number] Yes Take 1 tablet (40 mg total) by mouth 2  (two) times daily before a meal. Annitta Needs, NP Taking Active   prednisoLONE  acetate (PRED FORTE) 1 % ophthalmic suspension [id number] Yes Place 4 drops into both eyes 2 (two) times a week.  [provider] Taking Active Self  Tiotropium Bromide Monohydrate (SPIRIVA RESPIMAT) 2.5 MCG/ACT AERS [id number] Yes Inhale 1 puff into the lungs daily. Dara Hoyer, FNP Taking Active   triamcinolone ointment (KENALOG) 0.1 % [id number] Yes Apply 1 application topically 2 (two) times daily. Valentina Shaggy, MD Taking Active   varenicline (CHANTIX STARTING MONTH PAK) 0.5 MG X 11 & 1 MG X 42 tablet [id number] No Take one 0.5 mg tablet by mouth once daily for 3 days, then increase to one 0.5 mg tablet twice daily for 4 days, then increase to one 1 mg tablet twice daily. Take with food.  Patient not taking: No sig reported   Noreene Larsson, NP Not Taking Active             Patient Active Problem List   Diagnosis Date Noted   COPD exacerbation ([place]) 05/13/2021   Anxiety 04/24/2021   IDA (iron deficiency anemia) 04/02/2021   Allergic reaction 01/09/2021   Need for immunization against influenza 06/20/2020   Annual visit for general adult medical examination with abnormal findings 06/20/2020   Encounter for screening for malignant neoplasm of prostate 06/20/2020   Seasonal and perennial allergic rhinitis 11/10/2018   Restrictive lung disease 11/10/2018   Cirrhosis of liver ([place]) 11/08/2018   Barrett's esophagus 08/04/2018   Vitamin D deficiency 06/30/2018   Alcohol abuse 06/30/2018   DM type 2 causing vascular disease ([place]) 08/17/2017   Chronic combined systolic and diastolic CHF (congestive heart failure) ([place]) 05/15/2017   Sleep apnea 04/28/2017   Depression with anxiety 04/15/2017   GERD (gastroesophageal reflux disease) 09/23/2016   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/27/2015   Cigarette nicotine dependence with nicotine-induced disorder 08/27/2015   Mixed hyperlipidemia  08/27/2015   Class 1 obesity due to excess calories without serious comorbidity with body mass index (BMI) of 34.0 to 34.9 in adult 52/77/8242   Chronic systolic CHF (congestive heart failure) ([place]) 06/01/2015   CAD (coronary artery disease) 05/31/2015   Current smoker 05/31/2015   Essential hypertension, benign 02/09/2015    Immunization History  Administered Date(s) Administered   Influenza,inj,Quad PF,6+ Mos 07/03/2016, 05/07/2017, 06/20/2020   Influenza-Unspecified 05/17/2018   Moderna SARS-COV2 Booster Vaccination 03/01/2020   Moderna Sars-Covid-2 Vaccination 02/20/2020   Pneumococcal Conjugate-13 06/17/2017   Pneumococcal Polysaccharide-23 02/07/2015   Tdap 05/17/2019    Conditions to be addressed/monitored: CAD, HTN, HLD, COPD, DMII, smoking cessation and weight management  Care Plan : Medication Management  Updates made by Beryle Lathe, [place] since 05/22/2021 12:00 AM     Problem: Diabetes, Hypertension, Coronar Artery Disease, Hyperlipidemia, COPD/Asthma, Smoking Cessation, Weight Management   Priority: High  Onset Date: 04/18/2021     Long-Range Goal: Disease Progression Prevention   Start Date: 04/18/2021  Expected End Date: 07/17/2021  Recent Progress: On track  Priority: High  Note:   Current Barriers:  Unable to achieve control of diabetes Unable to quit smoking  Pharmacist Clinical Goal(s):  Over the next 90 days, patient will achieve control of diabetes as evidenced by improved A1c through collaboration with PharmD and provider.   Interventions: 1:1 collaboration with Noreene Larsson, NP regarding development and update of comprehensive plan of care as evidenced by provider attestation and co-signature Inter-disciplinary care team collaboration (see longitudinal plan of care) Comprehensive medication review performed; medication list updated in electronic medical record  Type 2 Diabetes (  Sees Endocrinology - Dr. Dorris Fetch): Current medications:  metformin 1,000 mg by mouth twice daily meals, glipizide XL 5 mg by mouth with breakfast, Trulicity 2.83 mg subcutaneously once weekly on Tuesday (recently started Trulicity by Dr. Dorris Fetch) Intolerances: none Taking medications as directed: yes Side effects thought to be attributed to current medication regimen: no Denies recent hypoglycemic/hyperglycemic symptoms - notes decrease in urination  Hypoglycemia prevention: not indicated at this time Current meal patterns: breakfast: cereal, banana sandwich, tasted ham/cheese, and biscuit; lunch: burger and corn dog; dinner: pizza, sub, and stew; snacks: chips and crackers; drinks: water and coffee with sugar Current exercise: has [employer] but has not been recently  On a statin: [x] Yes  [] No    Last microalbumin: 1.8 (11/09/19); on an ACEi/ARB: [] Yes  [x] No, contraindicated due to angioedema Last eye exam: Feb/March 2021 per patient; next scheduled Oct 2022 Last foot exam: unknown Pneumonia vaccine: up to date Current glucose readings: notes since improvement since starting Trulicity Uncontrolled; Most recent A1c above goal of <7% per ADA guidelines Hypoglycemia: I have discussed with the patient how to treat hypoglycemia by the rule of 15; eat/drink 15g of sugar in the form of glucose tabs, 4 ounces of juice or soda and recheck fingerstick glucose in 15 minutes. Chocolate bars and ice cream should be avoided because fat delays carbohydrate digestion and absorption. Retreat if glucose remains low. Driving should cease until glucose is normal. Need for daily foot inspection and annual eye exam Behavior change: setting realistic goals for initial changes Continue metformin 1,000 mg by mouth twice daily meals and glipizide XL 5 mg by mouth with breakfast Per Dr. Dorris Fetch, will continue to titrate Trulicity; however, will titrate slowly given increased risk of pancreatitis given current cigarette smoking  May be able to discontinue glipizide XL 5 mg by  mouth with breakfast once Trulicity fully ramped up and blood glucose improved Instructed to monitor blood sugars three times a day at the following times: fasting (at least 8 hours since last food consumption), 1-2 hours after breakfast, and bedtime Encouraged regular aerobic exercise with a goal of 30 minutes five times per week (150 minutes per week) Patient identified as a good candidate for SGLT-2 inhibitor given reduction in cardiovascular disease, slowed chronic kidney disease progression, low risk of hypoglycemia, weight loss, and improved morbidity and mortality for heart failure with reduced ejection fraction. Patient denies a history of significant genitourinary infections. No concern for hypotension/volume depletion. GFR at least >20 mL/minute/1.73 m2.  Hypertension: Current medications: amlodipine 10 mg by mouth once daily Intolerances: angioedema with lisinopril (avoid use of ACEi/ARB/ARNI); sinus pause and bradycardia with beta blocker Taking medications as directed: yes Side effects thought to be attributed to current medication regimen: no Denies dizziness, lightheadedness, blurred vision, and headache Home blood pressure readings: not checking but does have a machine Has not been using CPAP machine x8 months Blood pressure under good control. Blood pressure is at goal of <130/80 mmHg per 2017 AHA/ACC guidelines. Continue amlodipine 10 mg by mouth once daily Encourage dietary sodium restriction/DASH diet Recommend regular aerobic exercise Recommend home blood pressure monitoring, to bring results in next visit Discussed need for and importance of continued work on weight loss Discussed need for medication compliance Reviewed risks of hypertension, principles of treatment and consequences of untreated hypertension  CAD/Hyperlipidemia (Sees Cardiology - Dr. [name]): Uncontrolled; LDL slightly above goal <70 per 2018 ACC/AHA guidelines.  Current medications: atorvastatin 40 mg  by mouth once daily (reduced  from 33m due to elevated liver function tests) and aspirin 81 mg by mouth daily Intolerances: none Taking medications as directed: yes Side effects thought to be attributed to current medication regimen: no Continue atorvastatin 40 mg by mouth once daily Per discussion with Dr. BHarl Bowie will consider addition of ezetimibe 10 mg by mouth daily if LDL increases farther from goal at next lipid panel Recommend regular aerobic exercise Discussed need for and importance of continued work on weight loss Reviewed risks of hyperlipidemia, principles of treatment and consequences of untreated hyperlipidemia Discussed need for medication compliance Re-check lipid panel in 4-12 weeks  Overweight/Obesity Unable to achieve goal weight loss through lifestyle modification alone Current treatment: Trulicity 04.53mg subcutaneously once weekly  Medications/Strategies previously tried: none; Fasting/Modified Fasting Baseline weight: ~240 lbs; most recent weight: 207 lbs Extensive dietary counseling including education on focus on lean proteins, fruits and vegetables, whole grains and increased fiber consumption, adequate hydration Continue GLP-1 agonist Consider addition of SGLT2i as above Recommend diet modification to induce energy deficit of 500 kcal/day or greater Encouraged regular aerobic exercise with a goal of 30 minutes five times per week (150 minutes per week)  Heart failure with reduced ejection fraction (LVEF <40%) (Sees Cardiology - Dr. BHarl Bowie: Suboptimally managed Current treatment: furosemide 40 mg daily as needed for fluid overload ACEi/ARB/ARNi contraindicated due to angioedema; beta blocker contraindicated due to history of sinus pause and bradycardia Reports inability to take potassium tablets due to size of pill. Reports has not taken x1 year. Potassium level normal. Consider potassium capsule instead of tablet if needed in future. Stage C (Symptomatic heart  failure)/NYHA Class II (Slight limitation of physical activity. Comfortable at rest. Ordinary physical activity results in fatigue, palpitation, or dyspnea) Most recent echocardiogram was completed 05/17/21 with LVEF 45-50% which was slightly improved from 2016 (LVEF ~40%) Last BNP: 32.6 (2018) Current home blood pressure: not checking Current home weights: has not been checking recently because his battery died  Denies dyspnea at rest or on exertion, unexplained fatigue, swelling in the legs, ankles and feet, swelling of the abdomen, very rapid weight gain from fluid buildup, nausea, vomiting, or lack of appetite, and chest pain. Reports reduced exercise capacity, persistent cough or wheezing, orthopnea, and paroxysmal nocturnal dyspnea Consider addition of SGLT2i +/- spironolactone given contraindication to other guideline directed medication therapy. Will discuss with Dr. BHarl BowieEncourage dietary sodium restriction (<3 g/day) Educated on the importance of weighing daily. Patient aware to contact cardiology/primary care team if weight gain >3 lbs in 1 day or >5 lbs in 1 week Discussed need for and importance of continued work on weight loss  Chronic Obstructive Pulmonary Disease/Asthma/Smoking Cessation (Sees Pulmonology - Dr. GErnst Bowler: Smoking is uncontrolled; patient has cut back to half pack per day from 1 pack per day - congratulated on efforts  Recent COPD exacerbation not requiring hospitalization about 2 weeks ago. Patient reports he is nearing completion of his course of antibiotics. He reports feeling better but is not back to 100% Patient reports that he currently smokes half pack per day. He has switched to "ultra light" cigarettes. He wants to continue to work on cutting back so he can quit. He was unable to get Chantix from his pharmacy due to an unknown reason. Called pharmacy to clarify and they said they did not have it for him last time he was there because they were out of stock  and had to order it; however, it is ready for pickup now for a $0  copay. Patient made aware.  Current treatment: tiotropium (Spiriva Respimat) 1 inhalation once daily and budesonide/formoterol (Symbicort HFA) 160-4.5 mcg 2 puffs by mouth twice daily Reports he has also uses albuterol inhaler and nebulizer as needed Most recent Pulmonary Function Testing: 01/11/21 follows with Dr. [name] 1 exacerbations requiring treatment in the last 6 months  Current oxygen requirements: none Continue tiotropium (Spiriva Respimat) 1 inhalation once daily and budesonide/formoterol (Symbicort HFA) 160-4.5 mcg 2 puffs by mouth twice daily Discussed need for medication compliance Patient counseled on smoking cessation. Recommend initiating Chantix to assist patient quit smoking. Patient provided medication counseling for Chantix Reminded patient to rinse mouth with water and spit after using ICS containing inhaler to prevent thrush Recommend allergen avoidance (environmental control)  Patient Goals/Self-Care Activities Over the next 90 days, patient will:  Take medications as prescribed Focus on medication adherence by keeping up with prescription refills and either using a pill box or reminders to take your medications at the prescribed times Check blood sugar three times a day at the following times: fasting (at least 8 hours since last food consumption), 1-2 hours after breakfast, bedtime, and whenever patient experiences symptoms of hypo/hyperglycemia, document, and provide at future appointments Check blood pressure at least once daily, document, and provide at future appointments Weigh daily, and contact provider if weight gain of more than 3 lbs in 1 day or more than 5 lbs in 1 week Target a minimum of 150 minutes of moderate intensity exercise weekly Engage in dietary modifications by fewer sweetened foods & beverages, better food choices, and watch portion sizes/amount of food eaten at one time  Follow  Up Plan: Telephone follow up appointment with care management team member scheduled for: 06/12/21     Medication Assistance: None required.  Patient affirms current coverage meets needs.  Patient's preferred pharmacy is:  [facility], [address] [place] [address] Phone: [phone] Fax: [phone]  Follow Up:  Patient agrees to Care Plan and Follow-up.  Plan: Telephone follow up appointment with care management team member scheduled for:  06/12/21  Kennon Holter, PharmD Clinical Pharmacist [facility] [phone]

## 2021-05-22 NOTE — Patient Instructions (Signed)
[name],  It was great to talk to you today!  Please call me with any questions or concerns.   Visit Information  PATIENT GOALS:  Goals Addressed             This Visit's Progress    Medication Management       Patient Goals/Self-Care Activities Over the next 90 days, patient will:  Take medications as prescribed Focus on medication adherence by keeping up with prescription refills and either using a pill box or reminders to take your medications at the prescribed times Check blood sugar three times a day at the following times: fasting (at least 8 hours since last food consumption), 1-2 hours after breakfast, bedtime, and whenever patient experiences symptoms of hypo/hyperglycemia, document, and provide at future appointments Check blood pressure at least once daily, document, and provide at future appointments Weigh daily, and contact provider if weight gain of more than 3 lbs in 1 day or more than 5 lbs in 1 week Target a minimum of 150 minutes of moderate intensity exercise weekly Engage in dietary modifications by fewer sweetened foods & beverages, better food choices, and watch portion sizes/amount of food eaten at one time          The patient verbalized understanding of instructions, educational materials, and care plan provided today and declined offer to receive copy of patient instructions, educational materials, and care plan.   Telephone follow up appointment with care management team member scheduled for:06/12/21  Kennon Holter, PharmD Clinical Pharmacist [facility] [phone]

## 2021-05-23 ENCOUNTER — Telehealth: Payer: Self-pay

## 2021-05-23 ENCOUNTER — Other Ambulatory Visit: Payer: Self-pay | Admitting: Nurse Practitioner

## 2021-05-23 MED ORDER — LEVOFLOXACIN 750 MG PO TABS: 750.0000 mg | ORAL_TABLET | Freq: Every day | ORAL | 0 refills | Status: AC

## 2021-05-23 NOTE — Progress Notes (Signed)
-  was prescribed doxycycline at last OV for COPD exacerbation -he is allergic to augmentin -Rx. Levaquin since symptoms haven't improved

## 2021-05-23 NOTE — Telephone Encounter (Signed)
Pt informed

## 2021-05-23 NOTE — Telephone Encounter (Signed)
I sent in levaquin

## 2021-05-23 NOTE — Telephone Encounter (Signed)
Patient called he states he took his last pill for a infection in his lungs this morning but isnt any better states he was told to call us back and let us know so we may try him on another medication ph# [phone]

## 2021-05-26 ENCOUNTER — Other Ambulatory Visit: Payer: Self-pay | Admitting: Nurse Practitioner

## 2021-05-27 NOTE — Progress Notes (Signed)
Yes I think that would be a good idea. I think farxiga 10mg  daily if you are able to facilitate   [name] MD

## 2021-05-31 DIAGNOSIS — I2510 Atherosclerotic heart disease of native coronary artery without angina pectoris: Secondary | ICD-10-CM

## 2021-05-31 DIAGNOSIS — E1159 Type 2 diabetes mellitus with other circulatory complications: Secondary | ICD-10-CM | POA: Diagnosis not present

## 2021-05-31 DIAGNOSIS — I5022 Chronic systolic (congestive) heart failure: Secondary | ICD-10-CM

## 2021-05-31 DIAGNOSIS — J449 Chronic obstructive pulmonary disease, unspecified: Secondary | ICD-10-CM | POA: Diagnosis not present

## 2021-05-31 DIAGNOSIS — E782 Mixed hyperlipidemia: Secondary | ICD-10-CM | POA: Diagnosis not present

## 2021-05-31 DIAGNOSIS — I10 Essential (primary) hypertension: Secondary | ICD-10-CM | POA: Diagnosis not present

## 2021-05-31 DIAGNOSIS — I5042 Chronic combined systolic (congestive) and diastolic (congestive) heart failure: Secondary | ICD-10-CM | POA: Diagnosis not present

## 2021-06-05 ENCOUNTER — Ambulatory Visit (INDEPENDENT_AMBULATORY_CARE_PROVIDER_SITE_OTHER): Payer: Medicare Other

## 2021-06-05 DIAGNOSIS — J309 Allergic rhinitis, unspecified: Secondary | ICD-10-CM | POA: Diagnosis not present

## 2021-06-12 ENCOUNTER — Other Ambulatory Visit: Payer: Self-pay | Admitting: Internal Medicine

## 2021-06-12 ENCOUNTER — Ambulatory Visit (INDEPENDENT_AMBULATORY_CARE_PROVIDER_SITE_OTHER): Payer: Medicare Other | Admitting: Pharmacist

## 2021-06-12 DIAGNOSIS — E6609 Other obesity due to excess calories: Secondary | ICD-10-CM

## 2021-06-12 DIAGNOSIS — I5042 Chronic combined systolic (congestive) and diastolic (congestive) heart failure: Secondary | ICD-10-CM

## 2021-06-12 DIAGNOSIS — F17219 Nicotine dependence, cigarettes, with unspecified nicotine-induced disorders: Secondary | ICD-10-CM

## 2021-06-12 DIAGNOSIS — E1159 Type 2 diabetes mellitus with other circulatory complications: Secondary | ICD-10-CM

## 2021-06-12 DIAGNOSIS — J449 Chronic obstructive pulmonary disease, unspecified: Secondary | ICD-10-CM

## 2021-06-12 DIAGNOSIS — Z6834 Body mass index (BMI) 34.0-34.9, adult: Secondary | ICD-10-CM

## 2021-06-12 DIAGNOSIS — J4489 Other specified chronic obstructive pulmonary disease: Secondary | ICD-10-CM

## 2021-06-12 DIAGNOSIS — I10 Essential (primary) hypertension: Secondary | ICD-10-CM

## 2021-06-12 DIAGNOSIS — E66811 Obesity, class 1: Secondary | ICD-10-CM

## 2021-06-12 DIAGNOSIS — I2510 Atherosclerotic heart disease of native coronary artery without angina pectoris: Secondary | ICD-10-CM

## 2021-06-12 DIAGNOSIS — E782 Mixed hyperlipidemia: Secondary | ICD-10-CM

## 2021-06-12 MED ORDER — DAPAGLIFLOZIN PROPANEDIOL 10 MG PO TABS: 10.0000 mg | ORAL_TABLET | Freq: Every day | ORAL | 2 refills | Status: DC

## 2021-06-12 NOTE — Chronic Care Management (AMB) (Signed)
Chronic Care Management Pharmacy Note  06/12/2021 Name:  Randy Hebert MRN:  [id number] DOB:  1966/02/23  Summary: Type 2 Diabetes (Sees Endocrinology - Dr. Dorris Fetch): Current medications: metformin 1,000 mg by mouth twice daily meals, glipizide XL 5 mg by mouth with breakfast, Trulicity 9.50 mg subcutaneously once weekly on Tuesday (recently started Trulicity by Dr. Dorris Fetch) Current glucose readings: patient reports "being slack" lately and has not been checking blood glucose consistently; post-prandial 152 this AM Dr. [name] in agreement to add Farxiga 10 mg by mouth daily for heart failure. Discussed with covering attending since PCP out of office and prescription sent to pharmacy. Patient aware. Will notify Dr. Dorris Fetch as well. This addition may further lower blood glucose, patient instructed to watch blood glucose closely. May need to discontinue glipizide XL 5 mg by mouth with breakfast. Will follow-up.  Heart failure with reduced ejection fraction (LVEF <40%) (Sees Cardiology - Dr. [name]): Current treatment: furosemide 40 mg daily as needed for fluid overload Add Farxiga 10 mg by mouth daily per Dr. [name]. Will check BMP in 2-4 weeks.  ACEi/ARB/ARNi contraindicated due to angioedema; beta blocker contraindicated due to history of sinus pause and bradycardia  Chronic Obstructive Pulmonary Disease/Asthma/Smoking Cessation (Sees Pulmonology - Dr. [name]): Smoking is uncontrolled; patient has cut back to 13-15 cigarettes per day from 1 pack per day - congratulated on efforts  Recent COPD exacerbation not requiring hospitalization. Patient reports he has completed his course of antibiotics. He reports feeling better but is not back to 100%. Notes improvement in breathing. Patient reports that he has switched to "ultra light" cigarettes. He wants to continue to work on cutting back so he can quit. He is on DAY 10 of Chantix and is tolerating well for the most part except having some strange  dreams. Discussed taking second dose of the day earlier around noon with food to hopefully improve this issue.  Patient counseled on smoking cessation.    Subjective: Randy Hebert is an 55 y.o. year old male who is a primary patient of Noreene Larsson, NP.  The CCM team was consulted for assistance with disease management and care coordination needs.    Engaged with patient by telephone for follow up visit in response to provider referral for pharmacy case management and/or care coordination services.   Consent to Services:  The patient was given information about Chronic Care Management services, agreed to services, and gave verbal consent prior to initiation of services.  Please see initial visit note for detailed documentation.   Patient Care Team: Noreene Larsson, NP as PCP - General (Nurse Practitioner) Evans Lance, MD as PCP - Electrophysiology (Cardiology) [name] Alphonse Guild, MD as PCP - Cardiology (Cardiology) Beryle Lathe, [facility] (Pharmacist)  Objective:  Lab Results  Component Value Date   CREATININE 0.70 (L) 04/11/2021   CREATININE 0.64 (L) 10/24/2020   CREATININE 0.62 (L) 10/11/2020    Lab Results  Component Value Date   HGBA1C 9.0 (H) 04/11/2021   Last diabetic Eye exam:  Lab Results  Component Value Date/Time   HMDIABEYEEXA No Retinopathy 02/16/2020 12:00 AM    Last diabetic Foot exam: No results found for: HMDIABFOOTEX      Component Value Date/Time   CHOL 124 04/11/2021 0819   TRIG 118 04/11/2021 0819   HDL 28 (L) 04/11/2021 0819   CHOLHDL 7.1 (H) 06/21/2020 0707   VLDL 41 (H) 04/27/2017 0505   LDLCALC 74 04/11/2021 0819   LDLCALC 116 (H)  06/21/2020 0707    Hepatic Function Latest Ref Rng & Units 04/11/2021 10/24/2020 10/11/2020  Total Protein 6.0 - 8.5 g/dL 7.0 7.3 7.4  Albumin 3.8 - 4.9 g/dL 4.2 4.2 4.4  AST 0 - 40 IU/L 28 28 48(H)  ALT 0 - 44 IU/L 33 20 30  Alk Phosphatase 44 - 121 IU/L 145(H) 164(H) 169(H)  Total Bilirubin 0.0 - 1.2  mg/dL 0.9 1.0 1.0  Bilirubin, Direct 0.0 - 0.2 mg/dL - - -    Lab Results  Component Value Date/Time   TSH 3.61 06/21/2020 07:07 AM   TSH 1.58 11/03/2018 07:05 AM   FREET4 1.5 11/03/2018 07:05 AM   FREET4 1.4 11/17/2017 07:09 AM    CBC Latest Ref Rng & Units 04/11/2021 10/24/2020 06/21/2020  WBC 3.4 - 10.8 x10E3/uL 7.2 6.0 7.5  Hemoglobin 13.0 - 17.7 g/dL 15.0 11.8(L) 15.2  Hematocrit 37.5 - 51.0 % 44.4 39.1 45.6  Platelets 150 - 450 x10E3/uL 180 296 228    Lab Results  Component Value Date/Time   VD25OH 36 06/21/2020 07:07 AM   VD25OH 31 11/09/2019 07:14 AM    Clinical ASCVD: No  The ASCVD Risk score (Arnett DK, et al., 2019) failed to calculate for the following reasons:   The valid total cholesterol range is 130 to 320 mg/dL    Social History   Tobacco Use  Smoking Status Every Day   Packs/day: 1.00   Years: 46.00   Pack years: 46.00   Types: Cigarettes  Smokeless Tobacco Former   Types: Chew   BP Readings from Last 3 Encounters:  05/13/21 116/74  05/02/21 (!) 143/78  04/24/21 135/78   Pulse Readings from Last 3 Encounters:  05/13/21 80  05/02/21 92  04/24/21 85   Wt Readings from Last 3 Encounters:  05/13/21 207 lb (93.9 kg)  05/02/21 206 lb 3.2 oz (93.5 kg)  04/24/21 206 lb (93.4 kg)    Assessment: Review of patient past medical history, allergies, medications, health status, including review of consultants reports, laboratory and other test data, was performed as part of comprehensive evaluation and provision of chronic care management services.   SDOH:  (Social Determinants of Health) assessments and interventions performed:    CCM Care Plan  Allergies  Allergen Reactions   Carvedilol Other (See Comments)    Sinus pause on telemetry >3 seconds. Longest one 9 sec. No AV nodal agent   Lisinopril Anaphylaxis, Shortness Of Breath and Swelling    Angioedema, required intubation and mechanical ventilation   Amoxicillin Nausea And Vomiting and Nausea  Only    Nausea only - no allergy  Did it involve swelling of the face/tongue/throat, SOB, or low BP? No Did it involve sudden or severe rash/hives, skin peeling, or any reaction on the inside of your mouth or nose? No Did you need to seek medical attention at a hospital or doctor's office? No When did it last happen? childhood reaction      If all above answers are "NO", may proceed with cephalosporin use.     Medications Reviewed Today     Reviewed by Beryle Lathe, [facility] (Pharmacist) on 06/12/21 at [id number]  Med List Status: <None>   Medication Order Taking? Sig Documenting Provider Last Dose Status Informant  acetaminophen (TYLENOL) 500 MG tablet [id number] Yes Take 1,000 mg by mouth every 4 (four) hours as needed for mild pain or fever.  [provider] Taking Active Self  albuterol (PROVENTIL HFA) 108 (90 Base) MCG/ACT inhaler [id number] Yes  INHALE 2 PUFFS INTO THE LUNGS EVERY 6 HOURS AS NEEDED FOR SHORTNESS OF BREATH OR WHEEZING. Dara Hoyer, FNP Taking Active            Med Note Waldo Laine, Gwenyth Allegra   Thu Apr 18, 2021  9:15 AM) Uses once every morning   albuterol (PROVENTIL) (2.5 MG/3ML) 0.083% nebulizer solution [id number] Yes Take 3 mLs (2.5 mg total) by nebulization every 6 (six) hours as needed for wheezing or shortness of breath. Noreene Larsson, NP Taking Active   amLODipine (NORVASC) 10 MG tablet [id number] Yes TAKE 1 TABLET BY MOUTH ONCE A DAY. Noreene Larsson, NP Taking Active   ascorbic acid (VITAMIN C) 500 MG tablet [id number] Yes Take 1 tablet (500 mg total) by mouth daily. Barton Dubois, MD Taking Active   aspirin 81 MG EC tablet [id number] Yes Take 1 tablet (81 mg total) by mouth daily.  Patient taking differently: Take 81 mg by mouth in the morning and at bedtime.   Cheryln Manly, NP Taking Active Self  atorvastatin (LIPITOR) 40 MG tablet [id number] Yes Take 1 tablet (40 mg total) by mouth every evening. Perlie Mayo, NP Taking Active   Azelastine HCl  137 MCG/SPRAY SOLN [id number] Yes USE 2 SPRAYS IN EACH NOSTRIL TWICE DAILY. Valentina Shaggy, MD Taking Active   budesonide-formoterol [name]) 160-4.5 MCG/ACT inhaler [id number] Yes Inhale 2 puffs into the lungs 2 (two) times daily. Dara Hoyer, FNP Taking Active            Med Note Waldo Laine, [place] May 09, 2021  9:18 AM)    Cholecalciferol (VITAMIN D3) 125 MCG (5000 UT) CAPS [id number] Yes Take 1 capsule (5,000 Units total) by mouth daily. Cassandria Anger, MD Taking Active   citalopram (CELEXA) 20 MG tablet [id number] Yes Take 20 mg by mouth daily. [provider] Taking Active Self  cycloSPORINE (RESTASIS) 0.05 % ophthalmic emulsion [id number] Yes Place 1 drop into both eyes 2 (two) times daily. [provider] Taking Active Self  Dulaglutide (TRULICITY) 2.12 YQ/8.2NO SOPN [id number] Yes Inject 0.75 mg into the skin once a week. Cassandria Anger, MD Taking Active            Med Note Jim Like May 09, 2021  9:19 AM) Dewaine Conger every Tuesday  ferrous sulfate 325 (65 FE) MG tablet [id number] Yes Take 1 tablet (325 mg total) by mouth daily with breakfast. Noreene Larsson, NP Taking Active   furosemide (LASIX) 40 MG tablet [id number] Yes Take 1 tablet (40 mg total) by mouth daily as needed for fluid. Evans Lance, MD Taking Active   glipiZIDE (GLUCOTROL XL) 5 MG 24 hr tablet [id number] Yes TAKE 1 TABLET BY MOUTH DAILY WITH BREAKFAST. Cassandria Anger, MD Taking Active   glucose blood (ACCU-CHEK AVIVA PLUS) test strip [id number]  Use as instructed to check blood glucose once daily, DX E11.59 Cassandria Anger, MD  Active   magnesium gluconate (MAGONATE) 500 MG tablet [id number] Yes Take 500 mg by mouth 2 (two) times daily. [provider] Taking Active   metFORMIN (GLUCOPHAGE) 1000 MG tablet [id number] Yes TAKE 1 TABLET BY MOUTH TWICE DAILY WITH MEALS. Cassandria Anger, MD Taking Active   mometasone (ELOCON) 0.1 % cream  [id number] Yes Apply 1 application topically 2 (two) times a week.  [provider] Taking Active Self  nitroGLYCERIN (NITROSTAT) 0.4 MG SL tablet [id number] No Place 1 tablet (0.4 mg total)  under the tongue every 5 (five) minutes as needed for chest pain.  Patient not taking: No sig reported   Evans Lance, MD Not Taking Active   olopatadine (PATANOL) 0.1 % ophthalmic solution [id number] Yes 1 drop 2 (two) times daily. [provider] Taking Active   pantoprazole (PROTONIX) 40 MG tablet [id number] Yes Take 1 tablet (40 mg total) by mouth 2 (two) times daily before a meal. Annitta Needs, NP Taking Active   prednisoLONE acetate (PRED FORTE) 1 % ophthalmic suspension [id number] Yes Place 4 drops into both eyes 2 (two) times a week.  [provider] Taking Active Self  Tiotropium Bromide Monohydrate (SPIRIVA RESPIMAT) 2.5 MCG/ACT AERS [id number] Yes Inhale 1 puff into the lungs daily. Dara Hoyer, FNP Taking Active   triamcinolone ointment (KENALOG) 0.1 % [id number] Yes Apply 1 application topically 2 (two) times daily. Valentina Shaggy, MD Taking Active   varenicline (CHANTIX STARTING MONTH PAK) 0.5 MG X 11 & 1 MG X 42 tablet [id number] Yes Take one 0.5 mg tablet by mouth once daily for 3 days, then increase to one 0.5 mg tablet twice daily for 4 days, then increase to one 1 mg tablet twice daily. Take with food. Noreene Larsson, NP Taking Active             Patient Active Problem List   Diagnosis Date Noted   COPD exacerbation ([place]) 05/13/2021   Anxiety 04/24/2021   IDA (iron deficiency anemia) 04/02/2021   Allergic reaction 01/09/2021   Need for immunization against influenza 06/20/2020   Annual visit for general adult medical examination with abnormal findings 06/20/2020   Encounter for screening for malignant neoplasm of prostate 06/20/2020   Seasonal and perennial allergic rhinitis 11/10/2018   Restrictive lung disease 11/10/2018   Cirrhosis of liver ([place])  11/08/2018   Barrett's esophagus 08/04/2018   Vitamin D deficiency 06/30/2018   Alcohol abuse 06/30/2018   DM type 2 causing vascular disease ([place]) 08/17/2017   Chronic combined systolic and diastolic CHF (congestive heart failure) ([place]) 05/15/2017   Sleep apnea 04/28/2017   Depression with anxiety 04/15/2017   GERD (gastroesophageal reflux disease) 09/23/2016   Asthma-COPD overlap syndrome ([place]) 08/27/2015   Cigarette nicotine dependence with nicotine-induced disorder 08/27/2015   Mixed hyperlipidemia 08/27/2015   Class 1 obesity due to excess calories without serious comorbidity with body mass index (BMI) of 34.0 to 34.9 in adult 88/91/6945   Chronic systolic CHF (congestive heart failure) ([place]) 06/01/2015   CAD (coronary artery disease) 05/31/2015   Current smoker 05/31/2015   Essential hypertension, benign 02/09/2015    Immunization History  Administered Date(s) Administered   Influenza,inj,Quad PF,6+ Mos 07/03/2016, 05/07/2017, 06/20/2020   Influenza-Unspecified 05/17/2018   Moderna SARS-COV2 Booster Vaccination 03/01/2020   Moderna Sars-Covid-2 Vaccination 02/20/2020   Pneumococcal Conjugate-13 06/17/2017   Pneumococcal Polysaccharide-23 02/07/2015   Tdap 05/17/2019    Conditions to be addressed/monitored: CHF, HTH,CAD, COPD, DMII, weight management and smoking cessation  Care Plan : Medication Management  Updates made by Beryle Lathe, [place] since 06/12/2021 12:00 AM     Problem: Diabetes, Hypertension, Coronar Artery Disease, Hyperlipidemia, COPD/Asthma, Smoking Cessation, Weight Management   Priority: High  Onset Date: 04/18/2021     Long-Range Goal: Disease Progression Prevention   Start Date: 04/18/2021  Expected End Date: 07/17/2021  Recent Progress: On track  Priority: High  Note:   Current Barriers:  Unable to achieve control of diabetes Unable to quit smoking  Pharmacist Clinical Goal(s):  Over  the next 90 days, patient will achieve control of  diabetes as evidenced by improved A1c through collaboration with PharmD and provider.   Interventions: 1:1 collaboration with Noreene Larsson, NP regarding development and update of comprehensive plan of care as evidenced by provider attestation and co-signature Inter-disciplinary care team collaboration (see longitudinal plan of care) Comprehensive medication review performed; medication list updated in electronic medical record  Type 2 Diabetes (Sees Endocrinology - Dr. Dorris Fetch): Current medications: metformin 1,000 mg by mouth twice daily meals, glipizide XL 5 mg by mouth with breakfast, Trulicity 2.44 mg subcutaneously once weekly on Tuesday (recently started Trulicity by Dr. Dorris Fetch) Intolerances: none Taking medications as directed: yes Side effects thought to be attributed to current medication regimen: no Denies recent hypoglycemic/hyperglycemic symptoms - notes decrease in urination  Hypoglycemia prevention: not indicated at this time Current meal patterns: breakfast: cereal, banana sandwich, tasted ham/cheese, and biscuit; lunch: burger and corn dog; dinner: pizza, sub, and stew; snacks: chips and crackers; drinks: water, sweet tea, and coffee with sugar Current exercise: has [employer] but has not been recently  On a statin: _0  Yes  _1  No    Last microalbumin: 1.8 (11/09/19); on an ACEi/ARB: _2  Yes  _3  No, contraindicated due to angioedema Last eye exam: Feb/March 2021 per patient; next scheduled Oct 2022 Last foot exam: completed within last year Pneumonia vaccine: up to date Current glucose readings: patient reports "being slack" lately and has not been checking blood glucose consistently; post-prandial 152 this AM Uncontrolled; Most recent A1c above goal of <7% per ADA guidelines Hypoglycemia: I have discussed with the patient how to treat hypoglycemia by the rule of 15; eat/drink 15g of sugar in the form of glucose tabs, 4 ounces of juice or soda and recheck fingerstick glucose  in 15 minutes. Chocolate bars and ice cream should be avoided because fat delays carbohydrate digestion and absorption. Retreat if glucose remains low. Driving should cease until glucose is normal. Need for daily foot inspection and annual eye exam Behavior change: setting realistic goals for initial changes Continue metformin 1,000 mg by mouth twice daily meals and glipizide XL 5 mg by mouth with breakfast Per Dr. Dorris Fetch, will continue to titrate Trulicity; however, will titrate slowly given increased risk of pancreatitis given current cigarette smoking  Dr. [name] in agreement to add Farxiga 10 mg by mouth daily for heart failure. Discussed with covering attending since PCP out of office and prescription sent to pharmacy. Patient aware. Will notify Dr. Dorris Fetch as well. This addition may further lower blood glucose, patient instructed to watch blood glucose closely. May need to discontinue glipizide XL 5 mg by mouth with breakfast. Will follow-up. Instructed to monitor blood sugars three times a day at the following times: fasting (at least 8 hours since last food consumption), 1-2 hours after breakfast, and bedtime Encouraged regular aerobic exercise with a goal of 30 minutes five times per week (150 minutes per week)  Hypertension: Current medications: amlodipine 10 mg by mouth once daily Intolerances: angioedema with lisinopril (avoid use of ACEi/ARB/ARNI); sinus pause and bradycardia with beta blocker Taking medications as directed: yes Side effects thought to be attributed to current medication regimen: no Denies dizziness, lightheadedness, blurred vision, and headache Home blood pressure readings: not checking but does have a machine Has not been using CPAP machine x8 months Blood pressure under good control. Blood pressure is at goal of <130/80 mmHg per 2017 AHA/ACC guidelines. Continue amlodipine 10 mg by mouth once daily Encourage dietary sodium  restriction/DASH diet Recommend regular  aerobic exercise Recommend home blood pressure monitoring, to bring results in next visit Discussed need for and importance of continued work on weight loss Discussed need for medication compliance Reviewed risks of hypertension, principles of treatment and consequences of untreated hypertension  CAD/Hyperlipidemia (Sees Cardiology - Dr. [name]): Uncontrolled; LDL slightly above goal <70 per 2018 ACC/AHA guidelines.  Current medications: atorvastatin 40 mg by mouth once daily (reduced from 60m due to elevated liver function tests) and aspirin 81 mg by mouth daily Intolerances: none Taking medications as directed: yes Side effects thought to be attributed to current medication regimen: no Continue atorvastatin 40 mg by mouth once daily Per discussion with Dr. BHarl Bowie will consider addition of ezetimibe 10 mg by mouth daily if LDL increases farther from goal at next lipid panel Recommend regular aerobic exercise Discussed need for and importance of continued work on weight loss Reviewed risks of hyperlipidemia, principles of treatment and consequences of untreated hyperlipidemia Discussed need for medication compliance Re-check lipid panel in 4-12 weeks  Overweight/Obesity Unable to achieve goal weight loss through lifestyle modification alone Current treatment: Trulicity 04.74mg subcutaneously once weekly  Medications/Strategies previously tried: none; Fasting/Modified Fasting Baseline weight: ~240 lbs; most recent weight: 207 lbs Extensive dietary counseling including education on focus on lean proteins, fruits and vegetables, whole grains and increased fiber consumption, adequate hydration Continue GLP-1 agonist Plan to add Farxiga 10 mg by mouth daily as above Recommend diet modification to induce energy deficit of 500 kcal/day or greater Encouraged regular aerobic exercise with a goal of 30 minutes five times per week (150 minutes per week)  Heart failure with reduced ejection  fraction (LVEF <40%) (Sees Cardiology - Dr. BHarl Bowie: Suboptimally managed Current treatment: furosemide 40 mg daily as needed for fluid overload Dr. BHarl Bowiein agreement to add Farxiga 10 mg by mouth daily for heart failure. Discussed with covering attending since PCP out of office and prescription sent to pharmacy. Patient aware. Will also notify Dr. BHarl Bowie ACEi/ARB/ARNi contraindicated due to angioedema; beta blocker contraindicated due to history of sinus pause and bradycardia Reports inability to take potassium tablets due to size of pill. Reports has not taken x1 year. Potassium level normal. Consider potassium capsule instead of tablet if needed in future. Stage C (Symptomatic heart failure)/NYHA Class II (Slight limitation of physical activity. Comfortable at rest. Ordinary physical activity results in fatigue, palpitation, or dyspnea) Most recent echocardiogram was completed 05/17/21 with LVEF 45-50% which was slightly improved from 2016 (LVEF ~40%) Last BNP: 32.6 (2018) Current home blood pressure: not checking Current home weights: has not been checking recently because his battery died  Denies dyspnea at rest or on exertion, unexplained fatigue, swelling in the legs, ankles and feet, swelling of the abdomen, very rapid weight gain from fluid buildup, nausea, vomiting, or lack of appetite, and chest pain. Reports reduced exercise capacity, persistent cough or wheezing, orthopnea, and paroxysmal nocturnal dyspnea Add Faxiga 10 mg by mouth daily. Will check BMP in 2-4 weeks. Patient aware.  Consider addition spironolactone given contraindication to other guideline directed medication therapy.  Encourage dietary sodium restriction (<3 g/day) Educated on the importance of weighing daily. Patient aware to contact cardiology/primary care team if weight gain >3 lbs in 1 day or >5 lbs in 1 week Discussed need for and importance of continued work on weight loss  Chronic Obstructive Pulmonary  Disease/Asthma/Smoking Cessation (Sees Pulmonology - Dr. GErnst Bowler: Smoking is uncontrolled; patient has cut back to 13-15 cigarettes per day from  1 pack per day - congratulated on efforts  Recent COPD exacerbation not requiring hospitalization. Patient reports he has completed his course of antibiotics. He reports feeling better but is not back to 100%. Notes improvement in breathing. Patient reports that he has switched to "ultra light" cigarettes. He wants to continue to work on cutting back so he can quit. He is on DAY 10 of Chantix and is tolerating well for the most part except having some strange dreams. Discussed taking second dose of the day earlier around noon with food to hopefully improve this issue.  Current treatment: tiotropium (Spiriva Respimat) 1 inhalation once daily and budesonide/formoterol (Symbicort HFA) 160-4.5 mcg 2 puffs by mouth twice daily Reports he has also uses albuterol inhaler and nebulizer as needed Most recent Pulmonary Function Testing: 01/11/21 follows with Dr. [name] 1 exacerbations requiring treatment in the last 6 months  Current oxygen requirements: none Continue tiotropium (Spiriva Respimat) 1 inhalation once daily and budesonide/formoterol (Symbicort HFA) 160-4.5 mcg 2 puffs by mouth twice daily Discussed need for medication compliance Patient counseled on smoking cessation.  Patient provided medication counseling for Chantix Reminded patient to rinse mouth with water and spit after using ICS containing inhaler to prevent thrush Recommend allergen avoidance (environmental control)  Patient Goals/Self-Care Activities Over the next 90 days, patient will:  Take medications as prescribed Focus on medication adherence by keeping up with prescription refills and either using a pill box or reminders to take your medications at the prescribed times Check blood sugar three times a day at the following times: fasting (at least 8 hours since last food  consumption), 1-2 hours after breakfast, bedtime, and whenever patient experiences symptoms of hypo/hyperglycemia, document, and provide at future appointments Check blood pressure at least once daily, document, and provide at future appointments Weigh daily, and contact provider if weight gain of more than 3 lbs in 1 day or more than 5 lbs in 1 week Target a minimum of 150 minutes of moderate intensity exercise weekly Engage in dietary modifications by fewer sweetened foods & beverages, better food choices, and watch portion sizes/amount of food eaten at one time  Follow Up Plan: Telephone follow up appointment with care management team member scheduled for: 07/16/21     Medication Assistance: None required.  Patient affirms current coverage meets needs.  Patient's preferred pharmacy is:  [place], [place] [address] Phone: [phone] Fax: [phone]  Follow Up:  Patient agrees to Care Plan and Follow-up.  Plan: Telephone follow up appointment with care management team member scheduled for:  07/16/21  Kennon Holter, PharmD Clinical Pharmacist [facility] [phone]

## 2021-06-12 NOTE — Patient Instructions (Addendum)
[name],  It was great to talk to you today!  We are adding Farxiga 10 mg by mouth daily. We will check lab work in 2-4 weeks.   Please call me with any questions or concerns.   Visit Information  PATIENT GOALS:  Goals Addressed             This Visit's Progress    Medication Management       Patient Goals/Self-Care Activities Over the next 90 days, patient will:  Take medications as prescribed Focus on medication adherence by keeping up with prescription refills and either using a pill box or reminders to take your medications at the prescribed times Check blood sugar three times a day at the following times: fasting (at least 8 hours since last food consumption), 1-2 hours after breakfast, bedtime, and whenever patient experiences symptoms of hypo/hyperglycemia, document, and provide at future appointments Check blood pressure at least once daily, document, and provide at future appointments Weigh daily, and contact provider if weight gain of more than 3 lbs in 1 day or more than 5 lbs in 1 week Target a minimum of 150 minutes of moderate intensity exercise weekly Engage in dietary modifications by fewer sweetened foods & beverages, better food choices, and watch portion sizes/amount of food eaten at one time            The patient verbalized understanding of instructions, educational materials, and care plan provided today and declined offer to receive copy of patient instructions, educational materials, and care plan.   Telephone follow up appointment with care management team member scheduled for:07/16/21  Kennon Holter, PharmD Clinical Pharmacist [facility] [phone]

## 2021-06-21 ENCOUNTER — Telehealth: Payer: Self-pay | Admitting: Cardiology

## 2021-06-21 NOTE — Telephone Encounter (Signed)
Calling for Echo results

## 2021-06-21 NOTE — Telephone Encounter (Signed)
Laurine Blazer, LPN  84/69/6295 28:41 AM EDT Back to Top    Notified, copy to pcp.    Laurine Blazer, LPN  32/44/0102  7:25 PM EDT     Left message to return call.   Arnoldo Lenis, MD  05/27/2021  8:32 AM EDT     Echo shows some mild ongoing weakness to heart muscle, I have discussed with a a clinical pharmacist who is working in arranging starting a new medication called farxiga that can help strengthen the heart over time   [name] MD

## 2021-06-25 DIAGNOSIS — E119 Type 2 diabetes mellitus without complications: Secondary | ICD-10-CM | POA: Diagnosis not present

## 2021-06-25 LAB — HM DIABETES EYE EXAM

## 2021-06-26 ENCOUNTER — Ambulatory Visit (INDEPENDENT_AMBULATORY_CARE_PROVIDER_SITE_OTHER): Payer: Medicare Other

## 2021-06-26 DIAGNOSIS — J309 Allergic rhinitis, unspecified: Secondary | ICD-10-CM

## 2021-06-30 ENCOUNTER — Other Ambulatory Visit: Payer: Self-pay | Admitting: Gastroenterology

## 2021-06-30 ENCOUNTER — Other Ambulatory Visit: Payer: Self-pay | Admitting: "Endocrinology

## 2021-07-01 DIAGNOSIS — E782 Mixed hyperlipidemia: Secondary | ICD-10-CM | POA: Diagnosis not present

## 2021-07-01 DIAGNOSIS — I5042 Chronic combined systolic (congestive) and diastolic (congestive) heart failure: Secondary | ICD-10-CM | POA: Diagnosis not present

## 2021-07-01 DIAGNOSIS — I2510 Atherosclerotic heart disease of native coronary artery without angina pectoris: Secondary | ICD-10-CM | POA: Diagnosis not present

## 2021-07-01 DIAGNOSIS — J301 Allergic rhinitis due to pollen: Secondary | ICD-10-CM | POA: Diagnosis not present

## 2021-07-01 DIAGNOSIS — E1159 Type 2 diabetes mellitus with other circulatory complications: Secondary | ICD-10-CM

## 2021-07-01 DIAGNOSIS — I10 Essential (primary) hypertension: Secondary | ICD-10-CM | POA: Diagnosis not present

## 2021-07-01 DIAGNOSIS — J449 Chronic obstructive pulmonary disease, unspecified: Secondary | ICD-10-CM | POA: Diagnosis not present

## 2021-07-01 NOTE — Progress Notes (Signed)
VIALS MADE. EXP 07-01-22

## 2021-07-02 DIAGNOSIS — J3089 Other allergic rhinitis: Secondary | ICD-10-CM | POA: Diagnosis not present

## 2021-07-05 ENCOUNTER — Ambulatory Visit: Payer: Medicare Other | Admitting: Nurse Practitioner

## 2021-07-05 ENCOUNTER — Telehealth: Payer: Self-pay

## 2021-07-05 ENCOUNTER — Other Ambulatory Visit: Payer: Self-pay | Admitting: "Endocrinology

## 2021-07-05 MED ORDER — METFORMIN HCL 500 MG PO TABS: 500.0000 mg | ORAL_TABLET | Freq: Two times a day (BID) | ORAL | 2 refills | Status: DC

## 2021-07-05 NOTE — Telephone Encounter (Signed)
Called patient and let him know to lower his dose of Metformin to 500 mg PO Twice Daily. Patient verbalized an understanding and is going to cut his 1000 mg in half and take half daily until he finishes this bottle since he just received this.

## 2021-07-11 ENCOUNTER — Ambulatory Visit (INDEPENDENT_AMBULATORY_CARE_PROVIDER_SITE_OTHER): Payer: Medicare Other | Admitting: "Endocrinology

## 2021-07-11 ENCOUNTER — Other Ambulatory Visit: Payer: Self-pay

## 2021-07-11 ENCOUNTER — Telehealth: Payer: Self-pay | Admitting: "Endocrinology

## 2021-07-11 ENCOUNTER — Encounter: Payer: Self-pay | Admitting: "Endocrinology

## 2021-07-11 VITALS — BP 120/78 | HR 84 | Ht 65.0 in | Wt 207.6 lb

## 2021-07-11 DIAGNOSIS — I10 Essential (primary) hypertension: Secondary | ICD-10-CM | POA: Diagnosis not present

## 2021-07-11 DIAGNOSIS — F17200 Nicotine dependence, unspecified, uncomplicated: Secondary | ICD-10-CM

## 2021-07-11 DIAGNOSIS — E1159 Type 2 diabetes mellitus with other circulatory complications: Secondary | ICD-10-CM | POA: Diagnosis not present

## 2021-07-11 DIAGNOSIS — E782 Mixed hyperlipidemia: Secondary | ICD-10-CM

## 2021-07-11 MED ORDER — TRULICITY 1.5 MG/0.5ML ~~LOC~~ SOAJ: 1.5000 mg | SUBCUTANEOUS | 2 refills | Status: DC

## 2021-07-11 NOTE — Patient Instructions (Signed)

## 2021-07-11 NOTE — Telephone Encounter (Signed)
error 

## 2021-07-11 NOTE — Progress Notes (Signed)
07/11/2021, 3:02 PM      Endocrinology follow-up note    Subjective:    Patient ID: Randy Hebert, male    DOB: 1966/03/15.  Randy Hebert is being seen in  follow-up for management of currently uncontrolled symptomatic type 2 diabetes, hyperlipidemia, hypertension. PMD:  Noreene Larsson, NP.   Past Medical History:  Diagnosis Date   ACE inhibitor-aggravated angioedema    Allergy    Angio-edema    Anxiety    Arthritis    Asthma    hip replacement   Back pain    Bradycardia 04/28/2017   Bulging of cervical intervertebral disc    CAD (coronary artery disease)    lateral STEMI 02/06/2015 00% D1 occlusion treated with Promus Premier 2.5 mm x 16 mm DES, 70% ramus stenosis, 40% mid RCA stenosis, 45% distal RCA stenosis, EF 45-50%   CHF (congestive heart failure) (HCC)    COPD (chronic obstructive pulmonary disease) ([place])    Depression    Diabetes mellitus without complication ([place])    Difficult intubation    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   Dry eye    Dyspnea    Early satiety 09/23/2016   GERD (gastroesophageal reflux disease)    HCAP (healthcare-associated pneumonia) 05/15/2017   Headache    Heart murmur    Hip pain    Hyperlipidemia    Hypertension    Lobar pneumonia ([place]) 05/15/2017   Melena 08/04/2018   MI (myocardial infarction) ([place])    Myocardial infarction ([place])    Neck pain    Non-ST elevation (NSTEMI) myocardial infarction ([place]) 04/27/2017   NSTEMI (non-ST elevated myocardial infarction) ([place]) 04/26/2017   Otitis media    Pleurisy    Pneumonia due to COVID-19 virus 10/07/2019   Rectal bleeding 11/08/2018   Right shoulder pain 03/20/2020   Sinus pause    9 sec sinus pause on telemetry after started on coreg after MI, avoid AV nodal blocking agent   Sleep apnea    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   STEMI (ST elevation myocardial infarction) ([place]) 05/31/2015   Substance abuse ([place]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    Syncope 78/29/5621   Syncope and collapse 05/15/2017   Transaminitis 08/04/2018   Unilateral primary osteoarthritis, right hip 07/01/2016   Past Surgical History:  Procedure Laterality Date   BIOPSY  10/09/2016   Procedure: BIOPSY;  Surgeon: Daneil Dolin, MD;  [facility];  Service: Endoscopy;;   BIOPSY  11/28/2019   Procedure: BIOPSY;  Surgeon: Daneil Dolin, MD;  [facility];  Service: Endoscopy;;   CARDIAC CATHETERIZATION N/A 02/06/2015   Procedure: Left Heart Cath and Coronary Angiography;  Surgeon: Leonie Man, MD;  Location: [facility];  Service: Cardiovascular;  Laterality: N/A;   CARDIAC CATHETERIZATION N/A 02/06/2015   Procedure: Coronary Stent Intervention;  Surgeon: Leonie Man, MD;  Location: [facility];  Service: Cardiovascular;  Laterality: N/A;   COLONOSCOPY WITH PROPOFOL N/A 10/09/2016   Sigmoid and descending colon diverticulosis, four 4-6 mm polyps in sigmoid, one 4 mm polyp in descending. Tubular adenomas and hyperplastic. 5 year surveillance.    COLONOSCOPY WITH PROPOFOL N/A 11/24/2016   Sigmoid and descending colon diverticulosis, four 4-6 mm polyps in sigmoid, one 4 mm polyp in descending. Tubular adenomas and hyperplastic. 5 year surveillance.    CORONARY ANGIOPLASTY WITH STENT PLACEMENT  01/2015  CORONARY STENT INTERVENTION N/A 04/27/2017   Procedure: CORONARY STENT INTERVENTION;  Surgeon: Nelva Bush, MD;  Location: [facility];  Service: Cardiovascular;  Laterality: N/A;   ELECTROPHYSIOLOGY STUDY N/A 06/29/2017   Procedure: ELECTROPHYSIOLOGY STUDY;  Surgeon: Evans Lance, MD;  Location: [facility];  Service: Cardiovascular;  Laterality: N/A;   ESOPHAGOGASTRODUODENOSCOPY (EGD) WITH PROPOFOL N/A 10/09/2016   Dr. Gala Romney: LA grade a esophagitis.  Barrett's esophagus, biopsy-proven.  Small hiatal hernia.  EGD February 2019   ESOPHAGOGASTRODUODENOSCOPY (EGD) WITH PROPOFOL N/A 11/28/2019    salmon-colored  esophageal mucosa (Barrett's) small hiatal hernia, portal hypertensive gastropathy, normal duodenum, 3 year surveillance   INCISION / DRAINAGE HAND / FINGER     LEFT HEART CATH AND CORONARY ANGIOGRAPHY N/A 04/27/2017   Procedure: LEFT HEART CATH AND CORONARY ANGIOGRAPHY;  Surgeon: Nelva Bush, MD;  Location: [facility];  Service: Cardiovascular;  Laterality: N/A;   LOOP RECORDER INSERTION  06/29/2017   Procedure: Loop Recorder Insertion;  Surgeon: Evans Lance, MD;  Location: [facility];  Service: Cardiovascular;;   POLYPECTOMY  11/24/2016   Procedure: POLYPECTOMY;  Surgeon: Daneil Dolin, MD;  [facility];  Service: Endoscopy;;  descending and sigmoid   TOTAL HIP ARTHROPLASTY Right 07/01/2016   TOTAL HIP ARTHROPLASTY Right 07/01/2016   Procedure: RIGHT TOTAL HIP ARTHROPLASTY ANTERIOR APPROACH;  Surgeon: Mcarthur Rossetti, MD;  Location: [facility];  Service: Orthopedics;  Laterality: Right;   Social History   Socioeconomic History   Marital status: Divorced    Spouse name: alice   Number of children: 3   Years of education: 12   Highest education level: Not on file  Occupational History   Occupation: SSI  Tobacco Use   Smoking status: Every Day    Packs/day: 1.00    Years: 46.00    Pack years: 46.00    Types: Cigarettes   Smokeless tobacco: Former    Types: Nurse, children's Use: Former  Substance and Sexual Activity   Alcohol use: Yes    Comment: couple beers occ   Drug use: No    Comment: history of drug use- marijuana, cocaine- quit 2014   Sexual activity: Yes    Partners: Female    Birth control/protection: None  Other Topics Concern   Not on file  Social History Narrative   Lives with girlfriend [name]   3 children, but 1 OD  In 2019.   Grandchildren-2      Two cats: may and princess; and a dog-foxy      Enjoy: spending time with pets, TV, yard work       Diet: eats all food groups   Caffeine: half and half coffee,  some tea-half and half decaf   Water: 6-8 cups daily      Wears seat belt   Does not use phone while driving   Oceanographer at home      Social Determinants of Health   Financial Resource Strain: Low Risk    Difficulty of Paying Living Expenses: Not hard at all  Food Insecurity: No Food Insecurity   Worried About Charity fundraiser in the Last Year: Never true   Arboriculturist in the Last Year: Never true  Transportation Needs: No Transportation Needs   Lack of Transportation (Medical): No   Lack of Transportation (Non-Medical): No  Physical Activity: Insufficiently Active   Days of Exercise per Week: 3 days  Minutes of Exercise per Session: 20 min  Stress: No Stress Concern Present   Feeling of Stress : Not at all  Social Connections: Moderately Integrated   Frequency of Communication with Friends and Family: More than three times a week   Frequency of Social Gatherings with Friends and Family: More than three times a week   Attends Religious Services: More than 4 times per year   Active Member of Clubs or Organizations: Yes   Attends Archivist Meetings: More than 4 times per year   Marital Status: Divorced   Outpatient Encounter Medications as of 07/11/2021  Medication Sig   Dulaglutide (TRULICITY) 1.5 HL/4.5GY SOPN Inject 1.5 mg into the skin once a week.   acetaminophen (TYLENOL) 500 MG tablet Take 1,000 mg by mouth every 4 (four) hours as needed for mild pain or fever.    albuterol (PROVENTIL HFA) 108 (90 Base) MCG/ACT inhaler INHALE 2 PUFFS INTO THE LUNGS EVERY 6 HOURS AS NEEDED FOR SHORTNESS OF BREATH OR WHEEZING.   albuterol (PROVENTIL) (2.5 MG/3ML) 0.083% nebulizer solution Take 3 mLs (2.5 mg total) by nebulization every 6 (six) hours as needed for wheezing or shortness of breath.   amLODipine (NORVASC) 10 MG tablet TAKE 1 TABLET BY MOUTH ONCE A DAY.   ascorbic acid (VITAMIN C) 500 MG tablet Take 1 tablet (500 mg total) by mouth daily.   aspirin 81 MG  EC tablet Take 1 tablet (81 mg total) by mouth daily. (Patient taking differently: Take 81 mg by mouth in the morning and at bedtime.)   atorvastatin (LIPITOR) 40 MG tablet Take 1 tablet (40 mg total) by mouth every evening.   Azelastine HCl 137 MCG/SPRAY SOLN USE 2 SPRAYS IN EACH NOSTRIL TWICE DAILY.   budesonide-formoterol (SYMBICORT) 160-4.5 MCG/ACT inhaler Inhale 2 puffs into the lungs 2 (two) times daily.   Cholecalciferol (VITAMIN D3) 125 MCG (5000 UT) CAPS Take 1 capsule (5,000 Units total) by mouth daily.   citalopram (CELEXA) 20 MG tablet Take 20 mg by mouth daily.   cycloSPORINE (RESTASIS) 0.05 % ophthalmic emulsion Place 1 drop into both eyes 2 (two) times daily.   dapagliflozin propanediol (FARXIGA) 10 MG TABS tablet Take 1 tablet (10 mg total) by mouth daily before breakfast.   ferrous sulfate 325 (65 FE) MG tablet Take 1 tablet (325 mg total) by mouth daily with breakfast.   furosemide (LASIX) 40 MG tablet Take 1 tablet (40 mg total) by mouth daily as needed for fluid.   glipiZIDE (GLUCOTROL XL) 5 MG 24 hr tablet TAKE 1 TABLET BY MOUTH DAILY WITH BREAKFAST.   glucose blood (ACCU-CHEK AVIVA PLUS) test strip Use as instructed to check blood glucose once daily, DX E11.59   magnesium gluconate (MAGONATE) 500 MG tablet Take 500 mg by mouth 2 (two) times daily.   metFORMIN (GLUCOPHAGE) 500 MG tablet Take 1 tablet (500 mg total) by mouth 2 (two) times daily with a meal.   mometasone (ELOCON) 0.1 % cream Apply 1 application topically 2 (two) times a week.    nitroGLYCERIN (NITROSTAT) 0.4 MG SL tablet Place 1 tablet (0.4 mg total) under the tongue every 5 (five) minutes as needed for chest pain. (Patient not taking: No sig reported)   olopatadine (PATANOL) 0.1 % ophthalmic solution 1 drop 2 (two) times daily.   pantoprazole (PROTONIX) 40 MG tablet TAKE (1) TABLET BY MOUTH TWICE DAILY BEFORE A MEAL.   prednisoLONE acetate (PRED FORTE) 1 % ophthalmic suspension Place 4 drops into both eyes 2  (  two) times a week.    Tiotropium Bromide Monohydrate (SPIRIVA RESPIMAT) 2.5 MCG/ACT AERS Inhale 1 puff into the lungs daily.   triamcinolone ointment (KENALOG) 0.1 % Apply 1 application topically 2 (two) times daily.   varenicline (CHANTIX STARTING MONTH PAK) 0.5 MG X 11 & 1 MG X 42 tablet Take one 0.5 mg tablet by mouth once daily for 3 days, then increase to one 0.5 mg tablet twice daily for 4 days, then increase to one 1 mg tablet twice daily. Take with food. (Patient not taking: Reported on 07/11/2021)   [DISCONTINUED] TRULICITY 7.67 HA/1.9FX SOPN INJECT (0.75)MG INTO THE SKIN ONCE WEEKLY   No facility-administered encounter medications on file as of 07/11/2021.    ALLERGIES: Allergies  Allergen Reactions   Carvedilol Other (See Comments)    Sinus pause on telemetry >3 seconds. Longest one 9 sec. No AV nodal agent   Lisinopril Anaphylaxis, Shortness Of Breath and Swelling    Angioedema, required intubation and mechanical ventilation   Amoxicillin Nausea And Vomiting and Nausea Only    Nausea only - no allergy  Did it involve swelling of the face/tongue/throat, SOB, or low BP? No Did it involve sudden or severe rash/hives, skin peeling, or any reaction on the inside of your mouth or nose? No Did you need to seek medical attention at a hospital or doctor's office? No When did it last happen? childhood reaction      If all above answers are "NO", may proceed with cephalosporin use.     VACCINATION STATUS: Immunization History  Administered Date(s) Administered   Influenza,inj,Quad PF,6+ Mos 07/03/2016, 05/07/2017, 06/20/2020   Influenza-Unspecified 05/17/2018   Moderna SARS-COV2 Booster Vaccination 03/01/2020   Moderna Sars-Covid-2 Vaccination 02/20/2020   Pneumococcal Conjugate-13 06/17/2017   Pneumococcal Polysaccharide-23 02/07/2015   Tdap 05/17/2019    Diabetes He presents for his follow-up diabetic visit. He has type 2 diabetes mellitus. Onset time: He was diagnosed at  approximate age of 53 years. His disease course has been worsening. There are no hypoglycemic associated symptoms. Pertinent negatives for hypoglycemia include no confusion, headaches, pallor or seizures. Pertinent negatives for diabetes include no chest pain, no fatigue, no polydipsia, no polyphagia, no polyuria and no weakness. There are no hypoglycemic complications. Symptoms are worsening. Diabetic complications include heart disease and peripheral neuropathy. Risk factors for coronary artery disease include dyslipidemia, diabetes mellitus, hypertension, male sex, family history, obesity, sedentary lifestyle and tobacco exposure. Current diabetic treatment includes oral agent (monotherapy). He is compliant with treatment most of the time. His weight is fluctuating minimally. He is following a generally unhealthy diet. When asked about meal planning, he reported none. He has had a previous visit with a dietitian. He never participates in exercise. His home blood glucose trend is increasing steadily. His breakfast blood glucose range is generally >200 mg/dl. His overall blood glucose range is >200 mg/dl. (He presents with loss of control with average blood glucose of 203 for the last 7 days despite the fact that he was initiated on Farxiga 10 mg p.o. daily by his cardiologist.  He remains on Trulicity 9.02 mg weekly along with metformin 500 mg p.o. twice daily.  His recent A1c was 9% increasing from 6.1%.   ) An ACE inhibitor/angiotensin II receptor blocker is contraindicated (He has documented allergy for ACE inhibitor's). He does not see a podiatrist.Eye exam is not current.  Hyperlipidemia This is a chronic problem. The current episode started more than 1 year ago. Exacerbating diseases include diabetes and obesity.  Pertinent negatives include no chest pain, myalgias or shortness of breath. Current antihyperlipidemic treatment includes statins. Risk factors for coronary artery disease include dyslipidemia,  diabetes mellitus, family history, obesity, male sex, hypertension and a sedentary lifestyle.  Hypertension This is a chronic problem. The current episode started more than 1 year ago. Pertinent negatives include no chest pain, headaches, neck pain, palpitations or shortness of breath. Risk factors for coronary artery disease include diabetes mellitus, dyslipidemia, obesity, sedentary lifestyle and smoking/tobacco exposure. Past treatments include calcium channel blockers. Hypertensive end-organ damage includes CAD/MI.   Review of systems     Objective:    BP 120/78   Pulse 84   Ht 5\' 5"  (1.651 m)   Wt 207 lb 9.6 oz (94.2 kg)   BMI 34.55 kg/m   Wt Readings from Last 3 Encounters:  07/11/21 207 lb 9.6 oz (94.2 kg)  05/13/21 207 lb (93.9 kg)  05/02/21 206 lb 3.2 oz (93.5 kg)     Physical Exam- Limited    CMP     Component Value Date/Time   NA 138 04/11/2021 0819   K 4.4 04/11/2021 0819   CL 97 04/11/2021 0819   CO2 26 04/11/2021 0819   GLUCOSE 189 (H) 04/11/2021 0819   GLUCOSE 159 (H) 06/21/2020 0707   BUN 8 04/11/2021 0819   CREATININE 0.70 (L) 04/11/2021 0819   CREATININE 0.67 (L) 06/21/2020 0707   CALCIUM 9.7 04/11/2021 0819   PROT 7.0 04/11/2021 0819   ALBUMIN 4.2 04/11/2021 0819   AST 28 04/11/2021 0819   ALT 33 04/11/2021 0819   ALKPHOS 145 (H) 04/11/2021 0819   BILITOT 0.9 04/11/2021 0819   GFRNONAA 110 10/24/2020 0811   GFRNONAA 108 06/21/2020 0707   GFRAA 127 10/24/2020 0811   GFRAA 125 06/21/2020 0707    Diabetic Labs (most recent): Lab Results  Component Value Date   HGBA1C 9.0 (H) 04/11/2021   HGBA1C 6.1 10/17/2020   HGBA1C 6.3 (H) 06/21/2020     Lipid Panel ( most recent) Lipid Panel     Component Value Date/Time   CHOL 124 04/11/2021 0819   TRIG 118 04/11/2021 0819   HDL 28 (L) 04/11/2021 0819   CHOLHDL 7.1 (H) 06/21/2020 0707   VLDL 41 (H) 04/27/2017 0505   LDLCALC 74 04/11/2021 0819   LDLCALC 116 (H) 06/21/2020 0707      Lab  Results  Component Value Date   TSH 3.61 06/21/2020   TSH 1.58 11/03/2018   TSH 1.79 11/17/2017   TSH 1.635 04/26/2017   TSH 2.564 06/27/2015   FREET4 1.5 11/03/2018   FREET4 1.4 11/17/2017      Assessment & Plan:   1. DM type 2 causing vascular disease ([place])  - Randy Hebert has currently uncontrolled symptomatic type 2 DM since  55 years of age.  He presents with loss of control with average blood glucose of 203 for the last 7 days despite the fact that he was initiated on Farxiga 10 mg p.o. daily by his cardiologist.  He remains on Trulicity 4.76 mg weekly along with metformin 500 mg p.o. twice daily.  His recent A1c was 9% increasing from 6.1%.     -his diabetes is complicated by nonadherence to dietary recommendations, obesity/sedentary life, continued heavy smoking.  He also has recent diagnosis of CHF, coronary artery disease which required stent placement,  and Randy Hebert remains at extremely high risk for more acute and chronic complications which include CAD, CVA, CKD, retinopathy, and neuropathy.  These are all discussed in detail with the patient.  - I have counseled him on diet management and weight loss, by adopting a carbohydrate restricted/protein rich diet. -Still admits to dietary indiscretions including consumption of sweets and sweetened beverages.  - he acknowledges that there is a room for improvement in his food and drink choices. - Suggestion is made for him to avoid simple carbohydrates  from his diet including Cakes, Sweet Desserts, Ice Cream, Soda (diet and regular), Sweet Tea, Candies, Chips, Cookies, Store Bought Juices, Alcohol in Excess of  1-2 drinks a day, Artificial Sweeteners,  Coffee Creamer, and "Sugar-free" Products, Lemonade. This will help patient to have more stable blood glucose profile and potentially avoid unintended weight gain.   - I encouraged him to switch to  unprocessed or minimally processed complex starch and increased protein  intake (animal or plant source), fruits, and vegetables.  - he is advised to stick to a routine mealtimes to eat 3 meals  a day and avoid unnecessary snacks ( to snack only to correct hypoglycemia).    - I have approached him with the following individualized plan to manage diabetes and patient agrees:   -In light of his presentation with loss of control of diabetes, he will need more medications.  He may soon need insulin treatment.  Despite initiation of Farxiga by his cardiologist, he continues to run hypoglycemia averaging at 203 for the last 7 days . He is approached to start monitoring blood glucose 4 times a day and return for follow-up in 10 days.   -In the meantime he is advised to increase his Trulicity to 1.5 mg subcutaneously weekly, advised to continue Farxiga 10 mg p.o. daily at breakfast.  Side effects and precautions about this medication discussed with him.   He will need to continue on glipizide 5 mg p.o. daily at breakfast as well.   Single most important intervention would be dietary change for him.  I discussed [employer] plant predominant lifestyle nutrition with him. -The fact that he is a heavy smoker complicates optimal utility of GLP-1 receptor agonists due to risk of pancreatitis.  -He is encouraged to call clinic for blood glucose readings less than 70 or greater than 200x3 in a week.    -Patient specific target  A1c;  LDL, HDL, Triglycerides, and  Waist Circumference were discussed in detail.  2) BP/HTN:  -His blood pressure is controlled to target.  -He remains a chronic heavy smoker. He has a documented allergy of ACE inhibitors anaphylaxis .  He is advised to continue amlodipine 10 mg p.o. daily, with as needed diuretics.  He is extensively counseled for smoking cessation.     3) Lipids/HPL: Recent labs show LDL of 116, increasing from 74.  This is indicative of his inconsistency taking his medications.  I have reemphasized the need for continued treatment with  statins.  He is currently on atorvastatin 40 mg p.o. nightly.the above discussed diet program will also help with dyslipidemia and hypertension.  His liver enzymes have improved on the lower dose of atorvastatin.  He is advised to avoid fried food, and saturated/trans fats.  -He was advised to stay away from excessive alcohol intake. The patient was counseled on the dangers of tobacco use, and was advised to quit.  Reviewed strategies to maximize success, including removing cigarettes and smoking materials from environment.    4)  Weight/Diet: His BMI is 34.55- abdominal obesity clearly complicating his diabetes care.  He is a candidate for  modest weight loss.  CDE Consult has been initiated and in progress,  exercise, and detailed carbohydrates information provided.  5) Chronic Care/Health Maintenance:  -he  is on Statin medications and  is encouraged to continue to follow up with Ophthalmology, Dentist,  Podiatrist at least yearly or according to recommendations, and advised to  Quit smoking ( is a chronic heavy smoker for the last 40 days), and consider weaning off of alcohol.  I have recommended yearly flu vaccine and pneumonia vaccination at least every 5 years; moderate intensity exercise for up to 150 minutes weekly; and  sleep for at least 7 hours a day.   His recent screening ABI was negative for PAD in September 2022.  His study will be repeated in 5 years or sooner if needed.    - I advised patient to maintain close follow up with Noreene Larsson, NP for primary care needs.    I spent 45 minutes in the care of the patient today including review of labs from [place], Lipids, Thyroid Function, Hematology (current and previous including abstractions from other facilities); face-to-face time discussing  his blood glucose readings/logs, discussing hypoglycemia and hyperglycemia episodes and symptoms, medications doses, his options of short and long term treatment based on the latest standards  of care / guidelines;  discussion about incorporating lifestyle medicine;  and documenting the encounter.    Please refer to Patient Instructions for Blood Glucose Monitoring and Insulin/Medications Dosing Guide"  in media tab for additional information. Please  also refer to " Patient Self Inventory" in the Media  tab for reviewed elements of pertinent patient history.  Randy Hebert participated in the discussions, expressed understanding, and voiced agreement with the above plans.  All questions were answered to his satisfaction. he is encouraged to contact clinic should he have any questions or concerns prior to his return visit.   Follow up plan: - Return in about 10 days (around 07/21/2021) for Bring Meter and Logs- A1c in Office.  Glade Lloyd, MD [facility] [address] [place], [place] [address] Phone: [phone]  Fax: [phone]    07/11/2021, 3:02 PM  This note was partially dictated with voice recognition software. Similar sounding words can be transcribed inadequately or may not  be corrected upon review.

## 2021-07-16 ENCOUNTER — Ambulatory Visit (INDEPENDENT_AMBULATORY_CARE_PROVIDER_SITE_OTHER): Payer: Medicare Other | Admitting: Pharmacist

## 2021-07-16 DIAGNOSIS — J449 Chronic obstructive pulmonary disease, unspecified: Secondary | ICD-10-CM

## 2021-07-16 DIAGNOSIS — E782 Mixed hyperlipidemia: Secondary | ICD-10-CM

## 2021-07-16 DIAGNOSIS — E6609 Other obesity due to excess calories: Secondary | ICD-10-CM

## 2021-07-16 DIAGNOSIS — I10 Essential (primary) hypertension: Secondary | ICD-10-CM

## 2021-07-16 DIAGNOSIS — Z6834 Body mass index (BMI) 34.0-34.9, adult: Secondary | ICD-10-CM

## 2021-07-16 DIAGNOSIS — F17219 Nicotine dependence, cigarettes, with unspecified nicotine-induced disorders: Secondary | ICD-10-CM

## 2021-07-16 DIAGNOSIS — F17200 Nicotine dependence, unspecified, uncomplicated: Secondary | ICD-10-CM

## 2021-07-16 DIAGNOSIS — I2510 Atherosclerotic heart disease of native coronary artery without angina pectoris: Secondary | ICD-10-CM

## 2021-07-16 DIAGNOSIS — I5022 Chronic systolic (congestive) heart failure: Secondary | ICD-10-CM
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
## 2021-07-16 NOTE — Patient Instructions (Signed)
[name],  It was great to talk to you today!  Please call me with any questions or concerns.   Visit Information  Patient Goals/Self-Care Activities Over the next 90 days, patient will:  Take medications as prescribed Focus on medication adherence by keeping up with prescription refills and either using a pill box or reminders to take your medications at the prescribed times Check blood sugar three times a day at the following times: fasting (at least 8 hours since last food consumption), 1-2 hours after breakfast, bedtime, and whenever patient experiences symptoms of hypo/hyperglycemia, document, and provide at future appointments Check blood pressure at least once daily, document, and provide at future appointments Weigh daily, and contact provider if weight gain of more than 3 lbs in 1 day or more than 5 lbs in 1 week Target a minimum of 150 minutes of moderate intensity exercise weekly Engage in dietary modifications by fewer sweetened foods & beverages, better food choices, and watch portion sizes/amount of food eaten at one time  The patient verbalized understanding of instructions, educational materials, and care plan provided today and declined offer to receive copy of patient instructions, educational materials, and care plan.   Telephone follow up appointment with care management team member scheduled for:08/14/21  Kennon Holter, PharmD Clinical Pharmacist [facility] [phone]

## 2021-07-17 ENCOUNTER — Ambulatory Visit (INDEPENDENT_AMBULATORY_CARE_PROVIDER_SITE_OTHER): Payer: Medicare Other

## 2021-07-17 DIAGNOSIS — J309 Allergic rhinitis, unspecified: Secondary | ICD-10-CM

## 2021-07-22 ENCOUNTER — Ambulatory Visit (INDEPENDENT_AMBULATORY_CARE_PROVIDER_SITE_OTHER): Payer: Medicare Other | Admitting: "Endocrinology

## 2021-07-22 ENCOUNTER — Ambulatory Visit: Payer: Medicare Other | Admitting: Nurse Practitioner

## 2021-07-22 ENCOUNTER — Encounter: Payer: Self-pay | Admitting: "Endocrinology

## 2021-07-22 ENCOUNTER — Other Ambulatory Visit: Payer: Self-pay

## 2021-07-22 VITALS — BP 130/79 | HR 83 | Ht 65.0 in | Wt 208.8 lb

## 2021-07-22 DIAGNOSIS — E782 Mixed hyperlipidemia: Secondary | ICD-10-CM | POA: Diagnosis not present

## 2021-07-22 DIAGNOSIS — E1159 Type 2 diabetes mellitus with other circulatory complications: Secondary | ICD-10-CM

## 2021-07-22 DIAGNOSIS — F17200 Nicotine dependence, unspecified, uncomplicated: Secondary | ICD-10-CM

## 2021-07-22 DIAGNOSIS — I10 Essential (primary) hypertension: Secondary | ICD-10-CM | POA: Diagnosis not present

## 2021-07-22 LAB — POCT GLYCOSYLATED HEMOGLOBIN (HGB A1C): HbA1c, POC (controlled diabetic range): 7.3 % — AB (ref 0.0–7.0)

## 2021-07-22 MED ORDER — ACCU-CHEK AVIVA PLUS VI STRP: ORAL_STRIP | 2 refills | Status: DC

## 2021-07-22 NOTE — Progress Notes (Signed)
07/22/2021, 1:40 PM      Endocrinology follow-up note    Subjective:    Patient ID: Randy Hebert, male    DOB: [birth year].  Randy Hebert is being seen in  follow-up for management of currently uncontrolled symptomatic type 2 diabetes, hyperlipidemia, hypertension. PMD:  Noreene Larsson, NP.   Past Medical History:  Diagnosis Date   ACE inhibitor-aggravated angioedema    Allergy    Angio-edema    Anxiety    Arthritis    Asthma    hip replacement   Back pain    Bradycardia 04/28/2017   Bulging of cervical intervertebral disc    CAD (coronary artery disease)    lateral STEMI 02/06/2015 00% D1 occlusion treated with Promus Premier 2.5 mm x 16 mm DES, 70% ramus stenosis, 40% mid RCA stenosis, 45% distal RCA stenosis, EF 45-50%   CHF (congestive heart failure) (HCC)    COPD (chronic obstructive pulmonary disease) ([place])    Depression    Diabetes mellitus without complication ([place])    Difficult intubation    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   Dry eye    Dyspnea    Early satiety 09/23/2016   GERD (gastroesophageal reflux disease)    HCAP (healthcare-associated pneumonia) 05/15/2017   Headache    Heart murmur    Hip pain    Hyperlipidemia    Hypertension    Lobar pneumonia ([place]) 05/15/2017   Melena 08/04/2018   MI (myocardial infarction) ([place])    Myocardial infarction ([place])    Neck pain    Non-ST elevation (NSTEMI) myocardial infarction ([place]) 04/27/2017   NSTEMI (non-ST elevated myocardial infarction) ([place]) 04/26/2017   Otitis media    Pleurisy    Pneumonia due to COVID-19 virus 10/07/2019   Rectal bleeding 11/08/2018   Right shoulder pain 03/20/2020   Sinus pause    9 sec sinus pause on telemetry after started on coreg after MI, avoid AV nodal blocking agent   Sleep apnea    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   STEMI (ST elevation myocardial infarction) ([place]) 05/31/2015   Substance abuse ([place]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    Syncope 66/01/3015   Syncope and collapse 05/15/2017   Transaminitis 08/04/2018   Unilateral primary osteoarthritis, right hip 07/01/2016   Past Surgical History:  Procedure Laterality Date   BIOPSY  10/09/2016   Procedure: BIOPSY;  Surgeon: Daneil Dolin, MD;  [facility];  Service: Endoscopy;;   BIOPSY  11/28/2019   Procedure: BIOPSY;  Surgeon: Daneil Dolin, MD;  [facility];  Service: Endoscopy;;   CARDIAC CATHETERIZATION N/A 02/06/2015   Procedure: Left Heart Cath and Coronary Angiography;  Surgeon: Leonie Man, MD;  Location: [place] [facility];  Service: Cardiovascular;  Laterality: N/A;   CARDIAC CATHETERIZATION N/A 02/06/2015   Procedure: Coronary Stent Intervention;  Surgeon: Leonie Man, MD;  Location: [place] [facility];  Service: Cardiovascular;  Laterality: N/A;   COLONOSCOPY WITH PROPOFOL N/A 10/09/2016   Sigmoid and descending colon diverticulosis, four 4-6 mm polyps in sigmoid, one 4 mm polyp in descending. Tubular adenomas and hyperplastic. 5 year surveillance.    COLONOSCOPY WITH PROPOFOL N/A 11/24/2016   Sigmoid and descending colon diverticulosis, four 4-6 mm polyps in sigmoid, one 4 mm polyp in descending. Tubular adenomas and hyperplastic. 5 year surveillance.    CORONARY ANGIOPLASTY WITH STENT PLACEMENT  01/2015  CORONARY STENT INTERVENTION N/A 04/27/2017   Procedure: CORONARY STENT INTERVENTION;  Surgeon: Nelva Bush, MD;  Location: [place] [facility];  Service: Cardiovascular;  Laterality: N/A;   ELECTROPHYSIOLOGY STUDY N/A 06/29/2017   Procedure: ELECTROPHYSIOLOGY STUDY;  Surgeon: Evans Lance, MD;  Location: [place] [facility];  Service: Cardiovascular;  Laterality: N/A;   ESOPHAGOGASTRODUODENOSCOPY (EGD) WITH PROPOFOL N/A 10/09/2016   Dr. Gala Romney: LA grade a esophagitis.  Barrett's esophagus, biopsy-proven.  Small hiatal hernia.  EGD February 2019   ESOPHAGOGASTRODUODENOSCOPY (EGD) WITH PROPOFOL N/A 11/28/2019    salmon-colored  esophageal mucosa (Barrett's) small hiatal hernia, portal hypertensive gastropathy, normal duodenum, 3 year surveillance   INCISION / DRAINAGE HAND / FINGER     LEFT HEART CATH AND CORONARY ANGIOGRAPHY N/A 04/27/2017   Procedure: LEFT HEART CATH AND CORONARY ANGIOGRAPHY;  Surgeon: Nelva Bush, MD;  Location: [place] [facility];  Service: Cardiovascular;  Laterality: N/A;   LOOP RECORDER INSERTION  06/29/2017   Procedure: Loop Recorder Insertion;  Surgeon: Evans Lance, MD;  Location: [place] [facility];  Service: Cardiovascular;;   POLYPECTOMY  11/24/2016   Procedure: POLYPECTOMY;  Surgeon: Daneil Dolin, MD;  [facility];  Service: Endoscopy;;  descending and sigmoid   TOTAL HIP ARTHROPLASTY Right 07/01/2016   TOTAL HIP ARTHROPLASTY Right 07/01/2016   Procedure: RIGHT TOTAL HIP ARTHROPLASTY ANTERIOR APPROACH;  Surgeon: Mcarthur Rossetti, MD;  Location: [place];  Service: Orthopedics;  Laterality: Right;   Social History   Socioeconomic History   Marital status: Divorced    Spouse name: alice   Number of children: 3   Years of education: 12   Highest education level: Not on file  Occupational History   Occupation: SSI  Tobacco Use   Smoking status: Every Day    Packs/day: 0.50    Years: 46.00    Pack years: 23.00    Types: Cigarettes   Smokeless tobacco: Former    Types: Nurse, children's Use: Former  Substance and Sexual Activity   Alcohol use: Yes    Comment: couple beers occ   Drug use: No    Comment: history of drug use- marijuana, cocaine- quit 2014   Sexual activity: Yes    Partners: Female    Birth control/protection: None  Other Topics Concern   Not on file  Social History Narrative   Lives with girlfriend [name]   3 children, but 1 OD  In 2019.   Grandchildren-2      Two cats: may and princess; and a dog-foxy      Enjoy: spending time with pets, TV, yard work       Diet: eats all food groups   Caffeine: half and half coffee,  some tea-half and half decaf   Water: 6-8 cups daily      Wears seat belt   Does not use phone while driving   Oceanographer at home      Social Determinants of Health   Financial Resource Strain: Low Risk    Difficulty of Paying Living Expenses: Not hard at all  Food Insecurity: No Food Insecurity   Worried About Charity fundraiser in the Last Year: Never true   Arboriculturist in the Last Year: Never true  Transportation Needs: No Transportation Needs   Lack of Transportation (Medical): No   Lack of Transportation (Non-Medical): No  Physical Activity: Insufficiently Active   Days of Exercise per Week: 3 days  Minutes of Exercise per Session: 20 min  Stress: No Stress Concern Present   Feeling of Stress : Not at all  Social Connections: Moderately Integrated   Frequency of Communication with Friends and Family: More than three times a week   Frequency of Social Gatherings with Friends and Family: More than three times a week   Attends Religious Services: More than 4 times per year   Active Member of [employer] or Organizations: Yes   Attends Archivist Meetings: More than 4 times per year   Marital Status: Divorced   Outpatient Encounter Medications as of 07/22/2021  Medication Sig   nitroGLYCERIN (NITROSTAT) 0.4 MG SL tablet Place 1 tablet (0.4 mg total) under the tongue every 5 (five) minutes as needed for chest pain.   acetaminophen (TYLENOL) 500 MG tablet Take 1,000 mg by mouth every 4 (four) hours as needed for mild pain or fever.    albuterol (PROVENTIL HFA) 108 (90 Base) MCG/ACT inhaler INHALE 2 PUFFS INTO THE LUNGS EVERY 6 HOURS AS NEEDED FOR SHORTNESS OF BREATH OR WHEEZING.   albuterol (PROVENTIL) (2.5 MG/3ML) 0.083% nebulizer solution Take 3 mLs (2.5 mg total) by nebulization every 6 (six) hours as needed for wheezing or shortness of breath.   amLODipine (NORVASC) 10 MG tablet TAKE 1 TABLET BY MOUTH ONCE A DAY.   ascorbic acid (VITAMIN C) 500 MG tablet Take 1  tablet (500 mg total) by mouth daily.   aspirin 81 MG EC tablet Take 1 tablet (81 mg total) by mouth daily. (Patient taking differently: Take 81 mg by mouth in the morning and at bedtime.)   atorvastatin (LIPITOR) 40 MG tablet Take 1 tablet (40 mg total) by mouth every evening.   Azelastine HCl 137 MCG/SPRAY SOLN USE 2 SPRAYS IN EACH NOSTRIL TWICE DAILY.   budesonide-formoterol (SYMBICORT) 160-4.5 MCG/ACT inhaler Inhale 2 puffs into the lungs 2 (two) times daily.   Cholecalciferol (VITAMIN D3) 125 MCG (5000 UT) CAPS Take 1 capsule (5,000 Units total) by mouth daily.   citalopram (CELEXA) 20 MG tablet Take 20 mg by mouth daily.   cycloSPORINE (RESTASIS) 0.05 % ophthalmic emulsion Place 1 drop into both eyes 2 (two) times daily.   dapagliflozin propanediol (FARXIGA) 10 MG TABS tablet Take 1 tablet (10 mg total) by mouth daily before breakfast.   Dulaglutide (TRULICITY) 1.5 GX/2.1JH SOPN Inject 1.5 mg into the skin once a week.   ferrous sulfate 325 (65 FE) MG tablet Take 1 tablet (325 mg total) by mouth daily with breakfast.   furosemide (LASIX) 40 MG tablet Take 1 tablet (40 mg total) by mouth daily as needed for fluid.   glipiZIDE (GLUCOTROL XL) 5 MG 24 hr tablet TAKE 1 TABLET BY MOUTH DAILY WITH BREAKFAST.   glucose blood (ACCU-CHEK AVIVA PLUS) test strip Use as instructed to check blood glucose three times daily.   magnesium gluconate (MAGONATE) 500 MG tablet Take 500 mg by mouth 2 (two) times daily.   metFORMIN (GLUCOPHAGE) 500 MG tablet Take 1 tablet (500 mg total) by mouth 2 (two) times daily with a meal.   mometasone (ELOCON) 0.1 % cream Apply 1 application topically 2 (two) times a week.    olopatadine (PATANOL) 0.1 % ophthalmic solution 1 drop 2 (two) times daily.   pantoprazole (PROTONIX) 40 MG tablet TAKE (1) TABLET BY MOUTH TWICE DAILY BEFORE A MEAL.   prednisoLONE acetate (PRED FORTE) 1 % ophthalmic suspension Place 4 drops into both eyes 2 (two) times a week.  Tiotropium Bromide  Monohydrate (SPIRIVA RESPIMAT) 2.5 MCG/ACT AERS Inhale 1 puff into the lungs daily.   triamcinolone ointment (KENALOG) 0.1 % Apply 1 application topically 2 (two) times daily.   [DISCONTINUED] glucose blood (ACCU-CHEK AVIVA PLUS) test strip Use as instructed to check blood glucose once daily, DX E11.59   No facility-administered encounter medications on file as of 07/22/2021.    ALLERGIES: Allergies  Allergen Reactions   Carvedilol Other (See Comments)    Sinus pause on telemetry >3 seconds. Longest one 9 sec. No AV nodal agent   Lisinopril Anaphylaxis, Shortness Of Breath and Swelling    Angioedema, required intubation and mechanical ventilation   Chantix [Varenicline] Other (See Comments)    Nightmares and unable to sleep well   Amoxicillin Nausea And Vomiting and Nausea Only    Nausea only - no allergy  Did it involve swelling of the face/tongue/throat, SOB, or low BP? No Did it involve sudden or severe rash/hives, skin peeling, or any reaction on the inside of your mouth or nose? No Did you need to seek medical attention at a hospital or doctor's office? No When did it last happen? childhood reaction      If all above answers are "NO", may proceed with cephalosporin use.     VACCINATION STATUS: Immunization History  Administered Date(s) Administered   Influenza,inj,Quad PF,6+ Mos 07/03/2016, 05/07/2017, 06/20/2020   Influenza-Unspecified 05/17/2018   Moderna SARS-COV2 Booster Vaccination 03/01/2020   Moderna Sars-Covid-2 Vaccination 02/20/2020   Pneumococcal Conjugate-13 06/17/2017   Pneumococcal Polysaccharide-23 02/07/2015   Tdap 05/17/2019    Diabetes He presents for his follow-up diabetic visit. He has type 2 diabetes mellitus. Onset time: He was diagnosed at approximate age of 85 years. His disease course has been improving. There are no hypoglycemic associated symptoms. Pertinent negatives for hypoglycemia include no confusion, headaches, pallor or seizures.  Pertinent negatives for diabetes include no chest pain, no fatigue, no polydipsia, no polyphagia, no polyuria and no weakness. There are no hypoglycemic complications. Symptoms are improving. Diabetic complications include heart disease and peripheral neuropathy. Risk factors for coronary artery disease include dyslipidemia, diabetes mellitus, hypertension, male sex, family history, obesity, sedentary lifestyle and tobacco exposure. Current diabetic treatment includes oral agent (monotherapy). He is compliant with treatment most of the time. His weight is fluctuating minimally. He is following a generally unhealthy diet. When asked about meal planning, he reported none. He has had a previous visit with a dietitian. He never participates in exercise. His home blood glucose trend is decreasing steadily. His breakfast blood glucose range is generally 140-180 mg/dl. His lunch blood glucose range is generally 140-180 mg/dl. His dinner blood glucose range is generally 140-180 mg/dl. His bedtime blood glucose range is generally 140-180 mg/dl. His overall blood glucose range is 140-180 mg/dl. (He presents with improved glycemic profile.  His average blood glucose is 175 for the last 7 days, point-of-care A1c 7.3% today.   ) An ACE inhibitor/angiotensin II receptor blocker is contraindicated (He has documented allergy for ACE inhibitor's). He does not see a podiatrist.Eye exam is not current.  Hyperlipidemia This is a chronic problem. The current episode started more than 1 year ago. Exacerbating diseases include diabetes and obesity. Pertinent negatives include no chest pain, myalgias or shortness of breath. Current antihyperlipidemic treatment includes statins. Risk factors for coronary artery disease include dyslipidemia, diabetes mellitus, family history, obesity, male sex, hypertension and a sedentary lifestyle.  Hypertension This is a chronic problem. The current episode started more than 1 year  ago. Pertinent  negatives include no chest pain, headaches, neck pain, palpitations or shortness of breath. Risk factors for coronary artery disease include diabetes mellitus, dyslipidemia, obesity, sedentary lifestyle and smoking/tobacco exposure. Past treatments include calcium channel blockers. Hypertensive end-organ damage includes CAD/MI.   Review of systems    Objective:    BP 130/79   Pulse 83   Ht 5\' 5"  (1.651 m)   Wt 208 lb 12.8 oz (94.7 kg)   BMI 34.75 kg/m   Wt Readings from Last 3 Encounters:  07/22/21 208 lb 12.8 oz (94.7 kg)  07/11/21 207 lb 9.6 oz (94.2 kg)  05/13/21 207 lb (93.9 kg)     Physical Exam- Limited   CMP     Component Value Date/Time   NA 138 04/11/2021 0819   K 4.4 04/11/2021 0819   CL 97 04/11/2021 0819   CO2 26 04/11/2021 0819   GLUCOSE 189 (H) 04/11/2021 0819   GLUCOSE 159 (H) 06/21/2020 0707   BUN 8 04/11/2021 0819   CREATININE 0.70 (L) 04/11/2021 0819   CREATININE 0.67 (L) 06/21/2020 0707   CALCIUM 9.7 04/11/2021 0819   PROT 7.0 04/11/2021 0819   ALBUMIN 4.2 04/11/2021 0819   AST 28 04/11/2021 0819   ALT 33 04/11/2021 0819   ALKPHOS 145 (H) 04/11/2021 0819   BILITOT 0.9 04/11/2021 0819   GFRNONAA 110 10/24/2020 0811   GFRNONAA 108 06/21/2020 0707   GFRAA 127 10/24/2020 0811   GFRAA 125 06/21/2020 0707    Diabetic Labs (most recent): Lab Results  Component Value Date   HGBA1C 7.3 (A) 07/22/2021   HGBA1C 9.0 (H) 04/11/2021   HGBA1C 6.1 10/17/2020     Lipid Panel ( most recent) Lipid Panel     Component Value Date/Time   CHOL 124 04/11/2021 0819   TRIG 118 04/11/2021 0819   HDL 28 (L) 04/11/2021 0819   CHOLHDL 7.1 (H) 06/21/2020 0707   VLDL 41 (H) 04/27/2017 0505   LDLCALC 74 04/11/2021 0819   LDLCALC 116 (H) 06/21/2020 0707      Lab Results  Component Value Date   TSH 3.61 06/21/2020   TSH 1.58 11/03/2018   TSH 1.79 11/17/2017   TSH 1.635 04/26/2017   TSH 2.564 06/27/2015   FREET4 1.5 11/03/2018   FREET4 1.4 11/17/2017       Assessment & Plan:   1. DM type 2 causing vascular disease ([place])  - Randy Hebert has currently uncontrolled symptomatic type 2 DM since  55 years of age.  He presents with improved glycemic profile.  His average blood glucose is 175 for the last 7 days, point-of-care A1c 7.3% today.     -his diabetes is complicated by nonadherence to dietary recommendations, obesity/sedentary life, continued heavy smoking.  He also has recent diagnosis of CHF, coronary artery disease which required stent placement,  and Randy Hebert remains at extremely high risk for more acute and chronic complications which include CAD, CVA, CKD, retinopathy, and neuropathy. These are all discussed in detail with the patient.  - I have counseled him on diet management and weight loss, by adopting a carbohydrate restricted/protein rich diet. -Still admits to dietary indiscretions including consumption of sweets and sweetened beverages.  - he acknowledges that there is a room for improvement in his food and drink choices. - Suggestion is made for him to avoid simple carbohydrates  from his diet including Cakes, Sweet Desserts, Ice Cream, Soda (diet and regular), Sweet Tea, Candies, Chips, Cookies, Store Bought Juices,  Alcohol in Excess of  1-2 drinks a day, Artificial Sweeteners,  Coffee Creamer, and "Sugar-free" Products, Lemonade. This will help patient to have more stable blood glucose profile and potentially avoid unintended weight gain.   - I encouraged him to switch to  unprocessed or minimally processed complex starch and increased protein intake (animal or plant source), fruits, and vegetables.  - he is advised to stick to a routine mealtimes to eat 3 meals  a day and avoid unnecessary snacks ( to snack only to correct hypoglycemia).    - I have approached him with the following individualized plan to manage diabetes and patient agrees:   -In light of his presentation with near target glycemic profile,  he will not need insulin treatment at this time.  He is being treated with Farxiga 10 mg from his cardiologist.  He is advised to continue, along with glipizide 5 mg XL p.o. daily at breakfast.  He is also benefiting from Trulicity 1.5 mg subcutaneously weekly.    He is approached to continue monitoring blood glucose at least twice a day-daily before breakfast and at bedtime.   Single most important intervention would be dietary change for him.  I discussed [employer] plant predominant lifestyle nutrition with him. -The fact that he is a heavy smoker complicates optimal utility of GLP-1 receptor agonists due to risk of pancreatitis.  -He is encouraged to call clinic for blood glucose readings less than 70 or greater than 200x3 in a week.    -Patient specific target  A1c;  LDL, HDL, Triglycerides, and  Waist Circumference were discussed in detail.  2) BP/HTN:  -His blood pressure is controlled to target.  -He remains a chronic heavy smoker. He has a documented allergy of ACE inhibitors anaphylaxis .  He is advised to continue amlodipine 10 mg p.o. daily, with as needed diuretics.  He is extensively counseled for smoking cessation.     3) Lipids/HPL: Recent labs show LDL of 116, increasing from 74.  This is indicative of his inconsistency taking his medications.  I have reemphasized the need for continued treatment with statins.  He is currently on atorvastatin 40 mg p.o. nightly.the above discussed diet program will also help with dyslipidemia and hypertension.  His liver enzymes have improved on the lower dose of atorvastatin.  He is advised to avoid fried food, and saturated/trans fats.  -He was advised to stay away from excessive alcohol intake. The patient was counseled on the dangers of tobacco use, and was advised to quit.  Reviewed strategies to maximize success, including removing cigarettes and smoking materials from environment.    4)  Weight/Diet: His BMI is 34.55- abdominal obesity  clearly complicating his diabetes care.  He is a candidate for modest weight loss.  CDE Consult has been initiated and in progress,  exercise, and detailed carbohydrates information provided.  5) Chronic Care/Health Maintenance:  -he  is on Statin medications and  is encouraged to continue to follow up with Ophthalmology, Dentist,  Podiatrist at least yearly or according to recommendations, and advised to  Quit smoking ( is a chronic heavy smoker for the last 40 days), and consider weaning off of alcohol.  I have recommended yearly flu vaccine and pneumonia vaccination at least every 5 years; moderate intensity exercise for up to 150 minutes weekly; and  sleep for at least 7 hours a day.   His recent screening ABI was negative for PAD in September 2022.  His study will be repeated in  5 years or sooner if needed.    - I advised patient to maintain close follow up with Noreene Larsson, NP for primary care needs.    I spent 31 minutes in the care of the patient today including review of labs from [place], Lipids, Thyroid Function, Hematology (current and previous including abstractions from other facilities); face-to-face time discussing  his blood glucose readings/logs, discussing hypoglycemia and hyperglycemia episodes and symptoms, medications doses, his options of short and long term treatment based on the latest standards of care / guidelines;  discussion about incorporating lifestyle medicine;  and documenting the encounter.    Please refer to Patient Instructions for Blood Glucose Monitoring and Insulin/Medications Dosing Guide"  in media tab for additional information. Please  also refer to " Patient Self Inventory" in the Media  tab for reviewed elements of pertinent patient history.  Randy Hebert participated in the discussions, expressed understanding, and voiced agreement with the above plans.  All questions were answered to his satisfaction. he is encouraged to contact clinic should he  have any questions or concerns prior to his return visit.   Follow up plan: - Return in about 3 months (around 10/22/2021) for Bring Meter and Logs- A1c in Office.  Glade Lloyd, MD [facility] [address] [place], [place] [address] Phone: [phone]  Fax: [phone]    07/22/2021, 1:40 PM  This note was partially dictated with voice recognition software. Similar sounding words can be transcribed inadequately or may not  be corrected upon review.

## 2021-07-22 NOTE — Patient Instructions (Signed)
                                     Advice for Weight Management  -For most of us the best way to lose weight is by diet management. Generally speaking, diet management means consuming less calories intentionally which over time brings about progressive weight loss.  This can be achieved more effectively by restricting carbohydrate consumption to the minimum possible.  So, it is critically important to know your numbers: how much calorie you are consuming and how much calorie you need. More importantly, our carbohydrates sources should be unprocessed or minimally processed complex starch food items.   Sometimes, it is important to balance nutrition by increasing protein intake (animal or plant source), fruits, and vegetables.  - Whole Food, Plant Predominant Nutrition is highly recommended: Eat Plenty of vegetables, Mushrooms, fruits, Legumes, Whole Grains, Nuts, seeds in lieu of processed meats, processed snacks/pastries red meat, poultry, eggs.  -Sticking to a routine mealtime to eat 3 meals a day and avoiding unnecessary snacks is shown to have a big role in weight control. Under normal circumstances, the only time we lose real weight is when we are hungry, so allow hunger to take place- hunger means no food between meal times, only water.  It is not advisable to starve.   -It is better to avoid simple carbohydrates including: Cakes, Sweet Desserts, Ice Cream, Soda (diet and regular), Sweet Tea, Candies, Chips, Cookies, Store Bought Juices, Alcohol in Excess of  1-2 drinks a day, Lemonade,  Artificial Sweeteners, Doughnuts, Coffee Creamers, "Sugar-free" Products, etc, etc.  This is not a complete list.....    -Consulting with certified diabetes educators is proven to provide you with the most accurate and current information on diet.  Also, you may be  interested in discussing diet options/exchanges , we can schedule a visit with Randy Hebert, RDN, CDE for  individualized nutrition education.  -Exercise: If you are able: 30 -60 minutes a day ,4 days a week, or 150 minutes a week.  The longer the better.  Combine stretch, strength, and aerobic activities.  If you were told in the past that you have high risk for cardiovascular diseases, you may seek evaluation by your heart doctor prior to initiating moderate to intense exercise programs.                                  Additional Care Considerations for Diabetes   -Diabetes  is a chronic disease.  The most important care consideration is regular follow-up with your diabetes care provider with the goal being avoiding or delaying its complications and to take advantage of advances in medications and technology.    - Whole Food, Plant Predominant Nutrition is highly recommended: Eat Plenty of vegetables, Mushrooms, fruits, Legumes, Whole Grains, Nuts, seeds in lieu of processed meats, processed snacks/pastries red meat, poultry, eggs.  -Type 2 diabetes is known to coexist with other important comorbidities such as high blood pressure and high cholesterol.  It is critical to control not only the diabetes but also the high blood pressure and high cholesterol to minimize and delay the risk of complications including coronary artery disease, stroke, amputations, blindness, etc.    - Studies showed that people with diabetes will benefit from a class of medications known as ACE inhibitors and statins.  Unless   there are specific reasons not to be on these medications, the standard of care is to consider getting one from these groups of medications at an optimal doses.  These medications are generally considered safe and proven to help protect the heart and the kidneys.    - People with diabetes are encouraged to initiate and maintain regular follow-up with eye doctors, foot doctors, dentists , and if necessary heart and kidney doctors.     - It is highly recommended that people with diabetes quit smoking or  stay away from smoking, and get yearly  flu vaccine and pneumonia vaccine at least every 5 years.  One other important lifestyle recommendation is to ensure adequate sleep - at least 6-7 hours of uninterrupted sleep at night.  -Exercise: If you are able: 30 -60 minutes a day, 4 days a week, or 150 minutes a week.  The longer the better.  Combine stretch, strength, and aerobic activities.  If you were told in the past that you have high risk for cardiovascular diseases, you may seek evaluation by your heart doctor prior to initiating moderate to intense exercise programs.         

## 2021-07-24 ENCOUNTER — Encounter: Payer: Self-pay | Admitting: Internal Medicine

## 2021-07-24 ENCOUNTER — Ambulatory Visit (INDEPENDENT_AMBULATORY_CARE_PROVIDER_SITE_OTHER): Payer: Medicare Other | Admitting: Internal Medicine

## 2021-07-24 ENCOUNTER — Other Ambulatory Visit: Payer: Self-pay

## 2021-07-24 VITALS — BP 138/72 | HR 87 | Resp 18 | Ht 65.0 in | Wt 209.0 lb

## 2021-07-24 DIAGNOSIS — I25118 Atherosclerotic heart disease of native coronary artery with other forms of angina pectoris: Secondary | ICD-10-CM | POA: Diagnosis not present

## 2021-07-24 DIAGNOSIS — I5042 Chronic combined systolic (congestive) and diastolic (congestive) heart failure: Secondary | ICD-10-CM | POA: Diagnosis not present

## 2021-07-24 DIAGNOSIS — G5622 Lesion of ulnar nerve, left upper limb: Secondary | ICD-10-CM | POA: Diagnosis not present

## 2021-07-24 DIAGNOSIS — E1159 Type 2 diabetes mellitus with other circulatory complications: Secondary | ICD-10-CM | POA: Diagnosis not present

## 2021-07-24 DIAGNOSIS — J449 Chronic obstructive pulmonary disease, unspecified: Secondary | ICD-10-CM | POA: Diagnosis not present

## 2021-07-24 DIAGNOSIS — R238 Other skin changes: Secondary | ICD-10-CM | POA: Diagnosis not present

## 2021-07-24 DIAGNOSIS — G4733 Obstructive sleep apnea (adult) (pediatric): Secondary | ICD-10-CM

## 2021-07-24 DIAGNOSIS — Z23 Encounter for immunization: Secondary | ICD-10-CM | POA: Diagnosis not present

## 2021-07-24 MED ORDER — TRELEGY ELLIPTA 100-62.5-25 MCG/ACT IN AEPB: 1.0000 | INHALATION_SPRAY | Freq: Every day | RESPIRATORY_TRACT | 2 refills | Status: DC

## 2021-07-24 MED ORDER — TRELEGY ELLIPTA 100-62.5-25 MCG/ACT IN AEPB: 1.0000 | INHALATION_SPRAY | Freq: Every day | RESPIRATORY_TRACT | 0 refills | Status: DC

## 2021-07-24 NOTE — Assessment & Plan Note (Signed)
May need to change shampoo to improve scalp nutrition No visible rash or patches of hair loss noticed

## 2021-07-24 NOTE — Assessment & Plan Note (Signed)
Lab Results  Component Value Date   HGBA1C 7.3 (A) 07/22/2021    On metformin, glipizide, Trulicity and Farxiga Followed by Dr. Sallyanne Havers to follow diabetic diet On statin F/u CMP and lipid panel in the next visit Diabetic foot exam: Today Diabetic eye exam: Advised to follow up with Ophthalmology for diabetic eye exam

## 2021-07-24 NOTE — Patient Instructions (Addendum)
Samples of Trelegy given today. Please do not use Spiriva. Okay to use Albuterol as needed for shortness of breath or wheezing.  Please continue to take other medications as prescribed.  Please use wrist brace at nighttime for numbness of hand.

## 2021-07-24 NOTE — Assessment & Plan Note (Signed)
Advised to wear wrist brace at nighttime If persistent, will refer to hand surgeon

## 2021-07-24 NOTE — Assessment & Plan Note (Signed)
Uncontrolled with Spiriva and PRN Albuterol Changed to Trelegy, sample provided Continue as needed albuterol inhaler and nebulizer

## 2021-07-24 NOTE — Assessment & Plan Note (Signed)
Uses CPAP regularly °

## 2021-07-24 NOTE — Assessment & Plan Note (Signed)
Followed by Dr. [name] On Lasix and Aletta Edouard euvolemic currently

## 2021-07-24 NOTE — Progress Notes (Signed)
Established Patient Office Visit  Subjective:  Patient ID: Randy Hebert, male    DOB: February 02, 1966  Age: 55 y.o. MRN: [id number]  CC:  Chief Complaint  Patient presents with   Follow-up    3 month follow up pt sob is better but not all the way good also has rash in the lower back of his head has been there for awhile wanted to take a look at it     HPI Randy Hebert is a 55 y.o. male with past medical history of CAD s/p stent placement, HFrEF, type II DM with HLD, COPD, OSA, GERD, depression with anxiety and tobacco abuse who presents for follow-up of his chronic medical conditions.  Type II DM with HLD: He is on Trulicity and [place], followed by Dr. Dorris Fetch.  HTN and CAD: BP is well-controlled. Takes medications regularly. Patient denies headache, dizziness or palpitations.  He has a loop recorder in place, followed by Dr. Lovena Le.  He also sees Dr. [name] for history of CAD and HFrEF.  He is on [place] and Lasix for HFrEF.  He is on aspirin and statin for history of CAD.  He does complain of intermittent chest pain, worse with exertion and takes nitroglycerin for it.  COPD: He uses Spiriva and as needed albuterol inhaler and nebulizer for dyspnea.  His dyspnea is better now, but still has 8 intermittently, especially while working outdoors.  He denies any fever, chills, hemoptysis, nausea or vomiting currently.  He uses Astelin nasal spray for nasal congestion.  He complains of dry skin over the scalp area and intermittent itching.  Denies any recent change in hair products.  He also complains of numbness over last 2 digits of left hand, which is chronic.  He also reports funny sensation over the ulnar bone area when his left elbow touches a hard flooring.  Denies any recent injury.  Denies any swelling, warmth or erythema in the elbow or wrist area.  He received flu vaccine in the office today.   Past Medical History:  Diagnosis Date   ACE inhibitor-aggravated angioedema     Allergy    Angio-edema    Anxiety    Arthritis    Asthma    hip replacement   Back pain    Bradycardia 04/28/2017   Bulging of cervical intervertebral disc    CAD (coronary artery disease)    lateral STEMI 02/06/2015 00% D1 occlusion treated with Promus Premier 2.5 mm x 16 mm DES, 70% ramus stenosis, 40% mid RCA stenosis, 45% distal RCA stenosis, EF 45-50%   CHF (congestive heart failure) (HCC)    COPD (chronic obstructive pulmonary disease) ([place])    Depression    Diabetes mellitus without complication ([place])    Difficult intubation    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   Dry eye    Dyspnea    Early satiety 09/23/2016   GERD (gastroesophageal reflux disease)    HCAP (healthcare-associated pneumonia) 05/15/2017   Headache    Heart murmur    Hip pain    Hyperlipidemia    Hypertension    Lobar pneumonia ([place]) 05/15/2017   Melena 08/04/2018   MI (myocardial infarction) [facility])    Myocardial infarction ([place])    Neck pain    Non-ST elevation (NSTEMI) myocardial infarction ([place]) 04/27/2017   NSTEMI (non-ST elevated myocardial infarction) ([place]) 04/26/2017   Otitis media    Pleurisy    Pneumonia due to COVID-19 virus 10/07/2019   Rectal bleeding  11/08/2018   Right shoulder pain 03/20/2020   Sinus pause    9 sec sinus pause on telemetry after started on coreg after MI, avoid AV nodal blocking agent   Sleep apnea    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   STEMI (ST elevation myocardial infarction) ([place]) 05/31/2015   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   Syncope 69/62/9528   Syncope and collapse 05/15/2017   Transaminitis 08/04/2018   Unilateral primary osteoarthritis, right hip 07/01/2016    Past Surgical History:  Procedure Laterality Date   BIOPSY  10/09/2016   Procedure: BIOPSY;  Surgeon: Daneil Dolin, MD;  [facility];  Service: Endoscopy;;   BIOPSY  11/28/2019   Procedure: BIOPSY;  Surgeon: Daneil Dolin, MD;  [facility];   Service: Endoscopy;;   CARDIAC CATHETERIZATION N/A 02/06/2015   Procedure: Left Heart Cath and Coronary Angiography;  Surgeon: Leonie Man, MD;  Location: [place] [facility];  Service: Cardiovascular;  Laterality: N/A;   CARDIAC CATHETERIZATION N/A 02/06/2015   Procedure: Coronary Stent Intervention;  Surgeon: Leonie Man, MD;  Location: [place] [facility];  Service: Cardiovascular;  Laterality: N/A;   COLONOSCOPY WITH PROPOFOL N/A 10/09/2016   Sigmoid and descending colon diverticulosis, four 4-6 mm polyps in sigmoid, one 4 mm polyp in descending. Tubular adenomas and hyperplastic. 5 year surveillance.    COLONOSCOPY WITH PROPOFOL N/A 11/24/2016   Sigmoid and descending colon diverticulosis, four 4-6 mm polyps in sigmoid, one 4 mm polyp in descending. Tubular adenomas and hyperplastic. 5 year surveillance.    CORONARY ANGIOPLASTY WITH STENT PLACEMENT  01/2015   CORONARY STENT INTERVENTION N/A 04/27/2017   Procedure: CORONARY STENT INTERVENTION;  Surgeon: Nelva Bush, MD;  Location: [place] [facility];  Service: Cardiovascular;  Laterality: N/A;   ELECTROPHYSIOLOGY STUDY N/A 06/29/2017   Procedure: ELECTROPHYSIOLOGY STUDY;  Surgeon: Evans Lance, MD;  Location: [place] [facility];  Service: Cardiovascular;  Laterality: N/A;   ESOPHAGOGASTRODUODENOSCOPY (EGD) WITH PROPOFOL N/A 10/09/2016   Dr. Gala Romney: LA grade a esophagitis.  Barrett's esophagus, biopsy-proven.  Small hiatal hernia.  EGD February 2019   ESOPHAGOGASTRODUODENOSCOPY (EGD) WITH PROPOFOL N/A 11/28/2019    salmon-colored esophageal mucosa (Barrett's) small hiatal hernia, portal hypertensive gastropathy, normal duodenum, 3 year surveillance   INCISION / DRAINAGE HAND / FINGER     LEFT HEART CATH AND CORONARY ANGIOGRAPHY N/A 04/27/2017   Procedure: LEFT HEART CATH AND CORONARY ANGIOGRAPHY;  Surgeon: Nelva Bush, MD;  Location: [place] [facility];  Service: Cardiovascular;  Laterality: N/A;   LOOP RECORDER INSERTION  06/29/2017    Procedure: Loop Recorder Insertion;  Surgeon: Evans Lance, MD;  Location: [place] [facility];  Service: Cardiovascular;;   POLYPECTOMY  11/24/2016   Procedure: POLYPECTOMY;  Surgeon: Daneil Dolin, MD;  [facility];  Service: Endoscopy;;  descending and sigmoid   TOTAL HIP ARTHROPLASTY Right 07/01/2016   TOTAL HIP ARTHROPLASTY Right 07/01/2016   Procedure: RIGHT TOTAL HIP ARTHROPLASTY ANTERIOR APPROACH;  Surgeon: Mcarthur Rossetti, MD;  Location: [place];  Service: Orthopedics;  Laterality: Right;    Family History  Problem Relation Age of Onset   Heart attack Father    Stroke Father    Arthritis Father    Heart disease Father    Cancer Mother        ???   Arthritis Mother    Heart disease Brother 38       died  in sleep   Early death Brother    Diabetes Maternal Uncle    Alzheimer's disease Maternal Grandmother     Social History   Socioeconomic History   Marital status: Divorced    Spouse name: alice   Number of children: 3   Years of education: 12   Highest education level: Not on file  Occupational History   Occupation: SSI  Tobacco Use   Smoking status: Every Day    Packs/day: 0.50    Years: 46.00    Pack years: 23.00    Types: Cigarettes   Smokeless tobacco: Former    Types: Associate Professor Use: Former  Substance and Sexual Activity   Alcohol use: Yes    Comment: couple beers occ   Drug use: No    Comment: history of drug use- marijuana, cocaine- quit 2014   Sexual activity: Yes    Partners: Female    Birth control/protection: None  Other Topics Concern   Not on file  Social History Narrative   Lives with girlfriend [name]   3 children, but 1 OD  In 2019.   Grandchildren-2      Two cats: may and princess; and a dog-foxy      Enjoy: spending time with pets, TV, yard work       Diet: eats all food groups   Caffeine: half and half coffee, some tea-half and half decaf   Water: 6-8 cups daily      Wears seat belt   Does  not use phone while driving   Psychologist, sport and exercise at home      Social Determinants of Health   Financial Resource Strain: Low Risk    Difficulty of Paying Living Expenses: Not hard at all  Food Insecurity: No Food Insecurity   Worried About Programme researcher, broadcasting/film/video in the Last Year: Never true   Barista in the Last Year: Never true  Transportation Needs: No Transportation Needs   Lack of Transportation (Medical): No   Lack of Transportation (Non-Medical): No  Physical Activity: Insufficiently Active   Days of Exercise per Week: 3 days   Minutes of Exercise per Session: 20 min  Stress: No Stress Concern Present   Feeling of Stress : Not at all  Social Connections: Moderately Integrated   Frequency of Communication with Friends and Family: More than three times a week   Frequency of Social Gatherings with Friends and Family: More than three times a week   Attends Religious Services: More than 4 times per year   Active Member of [employer] or Organizations: Yes   Attends Engineer, structural: More than 4 times per year   Marital Status: Divorced  Catering manager Violence: Not At Risk   Fear of Current or Ex-Partner: No   Emotionally Abused: No   Physically Abused: No   Sexually Abused: No    Outpatient Medications Prior to Visit  Medication Sig Dispense Refill   acetaminophen (TYLENOL) 500 MG tablet Take 1,000 mg by mouth every 4 (four) hours as needed for mild pain or fever.      albuterol (PROVENTIL HFA) 108 (90 Base) MCG/ACT inhaler INHALE 2 PUFFS INTO THE LUNGS EVERY 6 HOURS AS NEEDED FOR SHORTNESS OF BREATH OR WHEEZING. 18 g 1   albuterol (PROVENTIL) (2.5 MG/3ML) 0.083% nebulizer solution Take 3 mLs (2.5 mg total) by nebulization every 6 (six) hours as needed for wheezing or shortness of breath. 150 mL 1  amLODipine (NORVASC) 10 MG tablet TAKE 1 TABLET BY MOUTH ONCE A DAY. 90 tablet 0   ascorbic acid (VITAMIN C) 500 MG tablet Take 1 tablet (500 mg total) by mouth daily.  30 tablet 1   aspirin 81 MG EC tablet Take 1 tablet (81 mg total) by mouth daily. (Patient taking differently: Take 81 mg by mouth in the morning and at bedtime.) 30 tablet    atorvastatin (LIPITOR) 40 MG tablet Take 1 tablet (40 mg total) by mouth every evening. 30 tablet 6   Azelastine HCl 137 MCG/SPRAY SOLN USE 2 SPRAYS IN EACH NOSTRIL TWICE DAILY. 30 mL 0   Cholecalciferol (VITAMIN D3) 125 MCG (5000 UT) CAPS Take 1 capsule (5,000 Units total) by mouth daily. 90 capsule 0   citalopram (CELEXA) 20 MG tablet Take 20 mg by mouth daily.     cycloSPORINE (RESTASIS) 0.05 % ophthalmic emulsion Place 1 drop into both eyes 2 (two) times daily.     dapagliflozin propanediol (FARXIGA) 10 MG TABS tablet Take 1 tablet (10 mg total) by mouth daily before breakfast. 30 tablet 2   Dulaglutide (TRULICITY) 1.5 XN/2.3FT SOPN Inject 1.5 mg into the skin once a week. 2 mL 2   ferrous sulfate 325 (65 FE) MG tablet Take 1 tablet (325 mg total) by mouth daily with breakfast. 90 tablet 1   furosemide (LASIX) 40 MG tablet Take 1 tablet (40 mg total) by mouth daily as needed for fluid. 30 tablet 11   glipiZIDE (GLUCOTROL XL) 5 MG 24 hr tablet TAKE 1 TABLET BY MOUTH DAILY WITH BREAKFAST. 90 tablet 0   glucose blood (ACCU-CHEK AVIVA PLUS) test strip Use as instructed to check blood glucose three times daily. 100 each 2   magnesium gluconate (MAGONATE) 500 MG tablet Take 500 mg by mouth 2 (two) times daily.     metFORMIN (GLUCOPHAGE) 500 MG tablet Take 1 tablet (500 mg total) by mouth 2 (two) times daily with a meal. 60 tablet 2   mometasone (ELOCON) 0.1 % cream Apply 1 application topically 2 (two) times a week.      nitroGLYCERIN (NITROSTAT) 0.4 MG SL tablet Place 1 tablet (0.4 mg total) under the tongue every 5 (five) minutes as needed for chest pain. 25 tablet 3   olopatadine (PATANOL) 0.1 % ophthalmic solution 1 drop 2 (two) times daily.     pantoprazole (PROTONIX) 40 MG tablet TAKE (1) TABLET BY MOUTH TWICE DAILY  BEFORE A MEAL. 60 tablet 5   prednisoLONE acetate (PRED FORTE) 1 % ophthalmic suspension Place 4 drops into both eyes 2 (two) times a week.      Tiotropium Bromide Monohydrate (SPIRIVA RESPIMAT) 2.5 MCG/ACT AERS Inhale 1 puff into the lungs daily. 4 g 5   triamcinolone ointment (KENALOG) 0.1 % Apply 1 application topically 2 (two) times daily. 454 g 1   budesonide-formoterol (SYMBICORT) 160-4.5 MCG/ACT inhaler Inhale 2 puffs into the lungs 2 (two) times daily. 10.2 g 5   No facility-administered medications prior to visit.    Allergies  Allergen Reactions   Carvedilol Other (See Comments)    Sinus pause on telemetry >3 seconds. Longest one 9 sec. No AV nodal agent   Lisinopril Anaphylaxis, Shortness Of Breath and Swelling    Angioedema, required intubation and mechanical ventilation   Chantix [Varenicline] Other (See Comments)    Nightmares and unable to sleep well   Amoxicillin Nausea And Vomiting and Nausea Only    Nausea only - no allergy  Did it  involve swelling of the face/tongue/throat, SOB, or low BP? No Did it involve sudden or severe rash/hives, skin peeling, or any reaction on the inside of your mouth or nose? No Did you need to seek medical attention at a hospital or doctor's office? No When did it last happen? childhood reaction      If all above answers are "NO", may proceed with cephalosporin use.     ROS Review of Systems  Constitutional:  Negative for chills.  HENT:  Positive for congestion. Negative for sinus pressure and sinus pain.   Eyes:  Negative for pain and discharge.  Respiratory:  Positive for cough and shortness of breath.   Gastrointestinal:  Negative for diarrhea, nausea and vomiting.  Genitourinary:  Negative for dysuria and hematuria.  Musculoskeletal:  Negative for neck pain and neck stiffness.  Skin:  Negative for rash.       Dry skin over scalp region  Neurological:  Negative for dizziness and weakness.  Psychiatric/Behavioral:  Negative for  agitation and behavioral problems.      Objective:    Physical Exam Vitals reviewed.  Constitutional:      General: He is not in acute distress.    Appearance: He is obese. He is not diaphoretic.  HENT:     Head: Normocephalic and atraumatic.     Nose: Nose normal.     Mouth/Throat:     Mouth: Mucous membranes are moist.  Eyes:     General: No scleral icterus.    Extraocular Movements: Extraocular movements intact.  Cardiovascular:     Rate and Rhythm: Normal rate and regular rhythm.     Pulses: Normal pulses.     Heart sounds: Normal heart sounds. No murmur heard. Pulmonary:     Breath sounds: Normal breath sounds. No wheezing or rales.  Musculoskeletal:     Cervical back: Neck supple. No tenderness.     Right lower leg: No edema.     Left lower leg: No edema.  Skin:    General: Skin is warm.     Findings: No rash.  Neurological:     General: No focal deficit present.     Mental Status: He is alert and oriented to person, place, and time.     Sensory: Sensory deficit (4th and 5th digits of left hand) present.     Motor: No weakness.  Psychiatric:        Mood and Affect: Mood normal.        Behavior: Behavior normal.    BP 138/72 (BP Location: Left Arm, Patient Position: Sitting, Cuff Size: Normal)   Pulse 87   Resp 18   Ht $R'5\' 5"'rI$  (1.651 m)   Wt 209 lb 0.6 oz (94.8 kg)   SpO2 93%   BMI 34.79 kg/m  Wt Readings from Last 3 Encounters:  07/24/21 209 lb 0.6 oz (94.8 kg)  07/22/21 208 lb 12.8 oz (94.7 kg)  07/11/21 207 lb 9.6 oz (94.2 kg)    Lab Results  Component Value Date   TSH 3.61 06/21/2020   Lab Results  Component Value Date   WBC 7.2 04/11/2021   HGB 15.0 04/11/2021   HCT 44.4 04/11/2021   MCV 87 04/11/2021   PLT 180 04/11/2021   Lab Results  Component Value Date   NA 138 04/11/2021   K 4.4 04/11/2021   CO2 26 04/11/2021   GLUCOSE 189 (H) 04/11/2021   BUN 8 04/11/2021   CREATININE 0.70 (L) 04/11/2021   BILITOT 0.9  04/11/2021   ALKPHOS 145  (H) 04/11/2021   AST 28 04/11/2021   ALT 33 04/11/2021   PROT 7.0 04/11/2021   ALBUMIN 4.2 04/11/2021   CALCIUM 9.7 04/11/2021   ANIONGAP 8 10/11/2019   EGFR 108 04/11/2021   Lab Results  Component Value Date   CHOL 124 04/11/2021   Lab Results  Component Value Date   HDL 28 (L) 04/11/2021   Lab Results  Component Value Date   LDLCALC 74 04/11/2021   Lab Results  Component Value Date   TRIG 118 04/11/2021   Lab Results  Component Value Date   CHOLHDL 7.1 (H) 06/21/2020   Lab Results  Component Value Date   HGBA1C 7.3 (A) 07/22/2021      Assessment & Plan:   Problem List Items Addressed This Visit       Cardiovascular and Mediastinum   CAD (coronary artery disease)    S/p stent placement Followed by Dr. [name] On aspirin and statin Has anginal pain at times, takes nitroglycerin      Chronic combined systolic and diastolic CHF (congestive heart failure) ([place])    Followed by Dr. [name] On Lasix and [place] Appears euvolemic currently      DM type 2 causing vascular disease ([place]) - Primary    Lab Results  Component Value Date   HGBA1C 7.3 (A) 07/22/2021   On metformin, glipizide, Trulicity and Farxiga Followed by Dr. Dorris Fetch Advised to follow diabetic diet On statin F/u CMP and lipid panel in the next visit Diabetic foot exam: Today Diabetic eye exam: Advised to follow up with Ophthalmology for diabetic eye exam       Relevant Orders   Microalbumin, urine     Respiratory   Asthma-COPD overlap syndrome ([place])    Uncontrolled with Spiriva and PRN Albuterol Changed to Trelegy, sample provided Continue as needed albuterol inhaler and nebulizer      Relevant Medications   Fluticasone-Umeclidin-Vilant (TRELEGY ELLIPTA) 100-62.5-25 MCG/ACT AEPB   Sleep apnea    Uses CPAP regularly        Nervous and Auditory   Ulnar tunnel syndrome, left    Advised to wear wrist brace at nighttime If persistent, will refer to hand surgeon        Other    Dry scalp    May need to change shampoo to improve scalp nutrition No visible rash or patches of hair loss noticed       Meds ordered this encounter  Medications   DISCONTD: Fluticasone-Umeclidin-Vilant (TRELEGY ELLIPTA) 100-62.5-25 MCG/ACT AEPB    Sig: Inhale 1 puff into the lungs daily.    Dispense:  1 each    Refill:  2   Fluticasone-Umeclidin-Vilant (TRELEGY ELLIPTA) 100-62.5-25 MCG/ACT AEPB    Sig: Inhale 1 puff into the lungs daily.    Dispense:  1 each    Refill:  0    Follow-up: Return in about 4 months (around 11/21/2021) for CAD, DM and COPD.    Lindell Spar, MD

## 2021-07-24 NOTE — Assessment & Plan Note (Addendum)
S/p stent placement Followed by Dr. [name] On aspirin and statin Has anginal pain at times, takes nitroglycerin

## 2021-07-26 LAB — MICROALBUMIN, URINE: Microalbumin, Urine: <3 ug/mL

## 2021-07-29 ENCOUNTER — Other Ambulatory Visit: Payer: Self-pay | Admitting: Internal Medicine

## 2021-07-29 ENCOUNTER — Other Ambulatory Visit: Payer: Self-pay | Admitting: "Endocrinology

## 2021-07-29 DIAGNOSIS — E782 Mixed hyperlipidemia: Secondary | ICD-10-CM

## 2021-07-31 DIAGNOSIS — I5022 Chronic systolic (congestive) heart failure: Secondary | ICD-10-CM | POA: Diagnosis not present

## 2021-07-31 DIAGNOSIS — E782 Mixed hyperlipidemia: Secondary | ICD-10-CM | POA: Diagnosis not present

## 2021-07-31 DIAGNOSIS — J449 Chronic obstructive pulmonary disease, unspecified: Secondary | ICD-10-CM

## 2021-07-31 DIAGNOSIS — I10 Essential (primary) hypertension: Secondary | ICD-10-CM

## 2021-07-31 DIAGNOSIS — I2510 Atherosclerotic heart disease of native coronary artery without angina pectoris: Secondary | ICD-10-CM

## 2021-08-07 ENCOUNTER — Other Ambulatory Visit: Payer: Self-pay | Admitting: Family Medicine

## 2021-08-07 ENCOUNTER — Other Ambulatory Visit: Payer: Self-pay | Admitting: Allergy & Immunology

## 2021-08-07 ENCOUNTER — Ambulatory Visit (INDEPENDENT_AMBULATORY_CARE_PROVIDER_SITE_OTHER): Payer: Medicare Other

## 2021-08-07 DIAGNOSIS — J309 Allergic rhinitis, unspecified: Secondary | ICD-10-CM

## 2021-08-14 ENCOUNTER — Telehealth: Payer: Medicare Other

## 2021-09-02 ENCOUNTER — Inpatient Hospital Stay (HOSPITAL_COMMUNITY)
Admission: EM | Admit: 2021-09-02 | Discharge: 2021-09-06 | DRG: 432 | Disposition: A | Discharge status: Home / Self Care | Payer: Medicare Other | Attending: Internal Medicine | Admitting: Internal Medicine

## 2021-09-02 ENCOUNTER — Emergency Department (HOSPITAL_COMMUNITY): Payer: Medicare Other

## 2021-09-02 ENCOUNTER — Encounter (HOSPITAL_COMMUNITY): Payer: Self-pay

## 2021-09-02 ENCOUNTER — Other Ambulatory Visit: Payer: Self-pay

## 2021-09-02 DIAGNOSIS — I5022 Chronic systolic (congestive) heart failure: Secondary | ICD-10-CM | POA: Diagnosis present

## 2021-09-02 DIAGNOSIS — E872 Acidosis, unspecified: Secondary | ICD-10-CM | POA: Diagnosis not present

## 2021-09-02 DIAGNOSIS — E1165 Type 2 diabetes mellitus with hyperglycemia: Secondary | ICD-10-CM | POA: Diagnosis present

## 2021-09-02 DIAGNOSIS — K920 Hematemesis: Secondary | ICD-10-CM | POA: Diagnosis not present

## 2021-09-02 DIAGNOSIS — E669 Obesity, unspecified: Secondary | ICD-10-CM | POA: Diagnosis present

## 2021-09-02 DIAGNOSIS — J449 Chronic obstructive pulmonary disease, unspecified: Secondary | ICD-10-CM | POA: Diagnosis not present

## 2021-09-02 DIAGNOSIS — K7460 Unspecified cirrhosis of liver: Secondary | ICD-10-CM | POA: Diagnosis present

## 2021-09-02 DIAGNOSIS — E46 Unspecified protein-calorie malnutrition: Secondary | ICD-10-CM

## 2021-09-02 DIAGNOSIS — Z20822 Contact with and (suspected) exposure to covid-19: Secondary | ICD-10-CM | POA: Diagnosis not present

## 2021-09-02 DIAGNOSIS — R778 Other specified abnormalities of plasma proteins: Secondary | ICD-10-CM | POA: Diagnosis not present

## 2021-09-02 DIAGNOSIS — K635 Polyp of colon: Secondary | ICD-10-CM | POA: Diagnosis present

## 2021-09-02 DIAGNOSIS — K7030 Alcoholic cirrhosis of liver without ascites: Principal | ICD-10-CM | POA: Diagnosis present

## 2021-09-02 DIAGNOSIS — R0602 Shortness of breath: Secondary | ICD-10-CM | POA: Diagnosis not present

## 2021-09-02 DIAGNOSIS — I10 Essential (primary) hypertension: Secondary | ICD-10-CM | POA: Diagnosis not present

## 2021-09-02 DIAGNOSIS — J9601 Acute respiratory failure with hypoxia: Secondary | ICD-10-CM | POA: Diagnosis not present

## 2021-09-02 DIAGNOSIS — E441 Mild protein-calorie malnutrition: Secondary | ICD-10-CM | POA: Diagnosis not present

## 2021-09-02 DIAGNOSIS — Z8261 Family history of arthritis: Secondary | ICD-10-CM

## 2021-09-02 DIAGNOSIS — I959 Hypotension, unspecified: Secondary | ICD-10-CM | POA: Diagnosis not present

## 2021-09-02 DIAGNOSIS — D62 Acute posthemorrhagic anemia: Secondary | ICD-10-CM | POA: Diagnosis not present

## 2021-09-02 DIAGNOSIS — I864 Gastric varices: Secondary | ICD-10-CM | POA: Diagnosis present

## 2021-09-02 DIAGNOSIS — E1159 Type 2 diabetes mellitus with other circulatory complications: Secondary | ICD-10-CM

## 2021-09-02 DIAGNOSIS — E8809 Other disorders of plasma-protein metabolism, not elsewhere classified: Secondary | ICD-10-CM | POA: Diagnosis not present

## 2021-09-02 DIAGNOSIS — Z880 Allergy status to penicillin: Secondary | ICD-10-CM

## 2021-09-02 DIAGNOSIS — Z79899 Other long term (current) drug therapy: Secondary | ICD-10-CM

## 2021-09-02 DIAGNOSIS — R197 Diarrhea, unspecified: Secondary | ICD-10-CM

## 2021-09-02 DIAGNOSIS — R079 Chest pain, unspecified: Secondary | ICD-10-CM | POA: Diagnosis not present

## 2021-09-02 DIAGNOSIS — I110 Hypertensive heart disease with heart failure: Secondary | ICD-10-CM | POA: Diagnosis not present

## 2021-09-02 DIAGNOSIS — R059 Cough, unspecified: Secondary | ICD-10-CM | POA: Diagnosis not present

## 2021-09-02 DIAGNOSIS — I252 Old myocardial infarction: Secondary | ICD-10-CM

## 2021-09-02 DIAGNOSIS — Z7982 Long term (current) use of aspirin: Secondary | ICD-10-CM

## 2021-09-02 DIAGNOSIS — Z833 Family history of diabetes mellitus: Secondary | ICD-10-CM

## 2021-09-02 DIAGNOSIS — E782 Mixed hyperlipidemia: Secondary | ICD-10-CM | POA: Diagnosis present

## 2021-09-02 DIAGNOSIS — Z823 Family history of stroke: Secondary | ICD-10-CM

## 2021-09-02 DIAGNOSIS — F17210 Nicotine dependence, cigarettes, uncomplicated: Secondary | ICD-10-CM | POA: Diagnosis not present

## 2021-09-02 DIAGNOSIS — Z7984 Long term (current) use of oral hypoglycemic drugs: Secondary | ICD-10-CM

## 2021-09-02 DIAGNOSIS — R739 Hyperglycemia, unspecified: Secondary | ICD-10-CM | POA: Diagnosis not present

## 2021-09-02 DIAGNOSIS — K922 Gastrointestinal hemorrhage, unspecified: Secondary | ICD-10-CM

## 2021-09-02 DIAGNOSIS — I517 Cardiomegaly: Secondary | ICD-10-CM | POA: Diagnosis not present

## 2021-09-02 DIAGNOSIS — K3189 Other diseases of stomach and duodenum: Secondary | ICD-10-CM | POA: Diagnosis not present

## 2021-09-02 DIAGNOSIS — G4733 Obstructive sleep apnea (adult) (pediatric): Secondary | ICD-10-CM | POA: Diagnosis not present

## 2021-09-02 DIAGNOSIS — Z6835 Body mass index (BMI) 35.0-35.9, adult: Secondary | ICD-10-CM

## 2021-09-02 DIAGNOSIS — Z950 Presence of cardiac pacemaker: Secondary | ICD-10-CM

## 2021-09-02 DIAGNOSIS — K2270 Barrett's esophagus without dysplasia: Secondary | ICD-10-CM | POA: Diagnosis not present

## 2021-09-02 DIAGNOSIS — J101 Influenza due to other identified influenza virus with other respiratory manifestations: Secondary | ICD-10-CM | POA: Diagnosis not present

## 2021-09-02 DIAGNOSIS — K449 Diaphragmatic hernia without obstruction or gangrene: Secondary | ICD-10-CM | POA: Diagnosis present

## 2021-09-02 DIAGNOSIS — I2510 Atherosclerotic heart disease of native coronary artery without angina pectoris: Secondary | ICD-10-CM | POA: Diagnosis present

## 2021-09-02 DIAGNOSIS — E875 Hyperkalemia: Secondary | ICD-10-CM | POA: Diagnosis not present

## 2021-09-02 DIAGNOSIS — K766 Portal hypertension: Secondary | ICD-10-CM | POA: Diagnosis not present

## 2021-09-02 DIAGNOSIS — Z955 Presence of coronary angioplasty implant and graft: Secondary | ICD-10-CM

## 2021-09-02 DIAGNOSIS — Z8249 Family history of ischemic heart disease and other diseases of the circulatory system: Secondary | ICD-10-CM

## 2021-09-02 DIAGNOSIS — R0789 Other chest pain: Secondary | ICD-10-CM | POA: Diagnosis not present

## 2021-09-02 DIAGNOSIS — I8511 Secondary esophageal varices with bleeding: Secondary | ICD-10-CM | POA: Diagnosis not present

## 2021-09-02 DIAGNOSIS — Z7951 Long term (current) use of inhaled steroids: Secondary | ICD-10-CM

## 2021-09-02 DIAGNOSIS — E7220 Disorder of urea cycle metabolism, unspecified: Secondary | ICD-10-CM

## 2021-09-02 DIAGNOSIS — Z96641 Presence of right artificial hip joint: Secondary | ICD-10-CM | POA: Diagnosis present

## 2021-09-02 DIAGNOSIS — Z820 Family history of epilepsy and other diseases of the nervous system: Secondary | ICD-10-CM

## 2021-09-02 DIAGNOSIS — F1010 Alcohol abuse, uncomplicated: Secondary | ICD-10-CM | POA: Diagnosis present

## 2021-09-02 DIAGNOSIS — Z888 Allergy status to other drugs, medicaments and biological substances status: Secondary | ICD-10-CM

## 2021-09-02 DIAGNOSIS — K921 Melena: Secondary | ICD-10-CM | POA: Diagnosis not present

## 2021-09-02 DIAGNOSIS — R509 Fever, unspecified: Secondary | ICD-10-CM | POA: Diagnosis not present

## 2021-09-02 DIAGNOSIS — R7989 Other specified abnormal findings of blood chemistry: Secondary | ICD-10-CM

## 2021-09-02 DIAGNOSIS — K219 Gastro-esophageal reflux disease without esophagitis: Secondary | ICD-10-CM | POA: Diagnosis present

## 2021-09-02 DIAGNOSIS — R0689 Other abnormalities of breathing: Secondary | ICD-10-CM | POA: Diagnosis not present

## 2021-09-02 DIAGNOSIS — I8500 Esophageal varices without bleeding: Secondary | ICD-10-CM | POA: Diagnosis not present

## 2021-09-02 DIAGNOSIS — J4489 Other specified chronic obstructive pulmonary disease: Secondary | ICD-10-CM

## 2021-09-02 LAB — COMPREHENSIVE METABOLIC PANEL
ALT: 39 U/L (ref 0–44)
AST: 36 U/L (ref 15–41)
Albumin: 3.4 g/dL — ABNORMAL LOW (ref 3.5–5.0)
Alkaline Phosphatase: 103 U/L (ref 38–126)
Anion gap: 10 (ref 5–15)
BUN: 52 mg/dL — ABNORMAL HIGH (ref 6–20)
CO2: 26 mmol/L (ref 22–32)
Calcium: 8.6 mg/dL — ABNORMAL LOW (ref 8.9–10.3)
Chloride: 99 mmol/L (ref 98–111)
Creatinine, Ser: 1.17 mg/dL (ref 0.61–1.24)
GFR, Estimated: >60 mL/min (ref >60)
Glucose, Bld: 315 mg/dL — ABNORMAL HIGH (ref 70–99)
Potassium: 5.5 mmol/L — ABNORMAL HIGH (ref 3.5–5.1)
Sodium: 135 mmol/L (ref 135–145)
Total Bilirubin: 1.3 mg/dL — ABNORMAL HIGH (ref 0.3–1.2)
Total Protein: 6.6 g/dL (ref 6.5–8.1)

## 2021-09-02 LAB — CBC
HCT: 40.6 % (ref 39.0–52.0)
Hemoglobin: 12.9 g/dL — ABNORMAL LOW (ref 13.0–17.0)
MCH: 29.6 pg (ref 26.0–34.0)
MCHC: 31.8 g/dL (ref 30.0–36.0)
MCV: 93.1 fL (ref 80.0–100.0)
Platelets: 200 K/uL (ref 150–400)
RBC: 4.36 MIL/uL (ref 4.22–5.81)
RDW: 13.7 % (ref 11.5–15.5)
WBC: 10.2 K/uL (ref 4.0–10.5)
nRBC: 0 % (ref 0.0–0.2)

## 2021-09-02 LAB — POC OCCULT BLOOD, ED: Fecal Occult Bld: POSITIVE — AB

## 2021-09-02 LAB — TROPONIN I (HIGH SENSITIVITY): Troponin I (High Sensitivity): 26 ng/L — ABNORMAL HIGH (ref <18)

## 2021-09-02 LAB — RESP PANEL BY RT-PCR (FLU A&B, COVID) ARPGX2
Influenza A by PCR: POSITIVE — AB
Influenza B by PCR: NEGATIVE
SARS Coronavirus 2 by RT PCR: NEGATIVE

## 2021-09-02 LAB — LACTIC ACID, PLASMA: Lactic Acid, Venous: 3.2 mmol/L (ref 0.5–1.9)

## 2021-09-02 LAB — APTT: aPTT: 31 seconds (ref 24–36)

## 2021-09-02 LAB — PROTIME-INR
INR: 1.2 (ref 0.8–1.2)
Prothrombin Time: 15.3 seconds — ABNORMAL HIGH (ref 11.4–15.2)

## 2021-09-02 LAB — OCCULT BLOOD GASTRIC / DUODENUM (SPECIMEN CUP)
Occult Blood, Gastric: POSITIVE — AB
pH, Gastric: 7

## 2021-09-02 LAB — BRAIN NATRIURETIC PEPTIDE: B Natriuretic Peptide: 37 pg/mL (ref 0.0–100.0)

## 2021-09-02 MED ORDER — LACTATED RINGERS IV BOLUS (SEPSIS)
1000.0000 mL | Freq: Once | INTRAVENOUS | Status: AC
  Administered 2021-09-02: 1000 mL via INTRAVENOUS

## 2021-09-02 MED ORDER — PANTOPRAZOLE SODIUM 40 MG IV SOLR
INTRAVENOUS | Status: AC
  Filled 2021-09-02: qty 160

## 2021-09-02 MED ORDER — SODIUM CHLORIDE 0.9 % IV SOLN
500.0000 mg | INTRAVENOUS | Status: DC
  Administered 2021-09-02: 500 mg via INTRAVENOUS
  Filled 2021-09-02: qty 5

## 2021-09-02 MED ORDER — OSELTAMIVIR PHOSPHATE 75 MG PO CAPS
75.0000 mg | ORAL_CAPSULE | Freq: Once | ORAL | Status: DC
  Filled 2021-09-02: qty 1

## 2021-09-02 MED ORDER — PANTOPRAZOLE 80MG IVPB - SIMPLE MED
80.0000 mg | Freq: Once | INTRAVENOUS | Status: AC
  Administered 2021-09-02: 80 mg via INTRAVENOUS
  Filled 2021-09-02 (×2): qty 100

## 2021-09-02 MED ORDER — SODIUM CHLORIDE 0.9 % IV SOLN
50.0000 ug/h | INTRAVENOUS | Status: DC
  Administered 2021-09-03 – 2021-09-06 (×6): 50 ug/h via INTRAVENOUS
  Filled 2021-09-02 (×17): qty 1

## 2021-09-02 MED ORDER — PANTOPRAZOLE INFUSION (NEW) - SIMPLE MED
8.0000 mg/h | INTRAVENOUS | Status: AC
  Administered 2021-09-03 – 2021-09-05 (×7): 8 mg/h via INTRAVENOUS
  Filled 2021-09-02 (×8): qty 100
  Filled 2021-09-02 (×2): qty 80

## 2021-09-02 MED ORDER — OCTREOTIDE LOAD VIA INFUSION
50.0000 ug | Freq: Once | INTRAVENOUS | Status: AC
  Administered 2021-09-03: 50 ug via INTRAVENOUS
  Filled 2021-09-02: qty 25

## 2021-09-02 MED ORDER — ONDANSETRON HCL 4 MG/2ML IJ SOLN
4.0000 mg | Freq: Once | INTRAMUSCULAR | Status: AC
  Administered 2021-09-02: 4 mg via INTRAVENOUS
  Filled 2021-09-02: qty 2

## 2021-09-02 MED ORDER — SODIUM CHLORIDE 0.9 % IV SOLN
2.0000 g | INTRAVENOUS | Status: DC
  Administered 2021-09-02: 2 g via INTRAVENOUS
  Filled 2021-09-02: qty 20

## 2021-09-02 MED ORDER — LACTATED RINGERS IV SOLN: INTRAVENOUS | Status: AC

## 2021-09-02 MED ORDER — PANTOPRAZOLE SODIUM 40 MG IV SOLR
40.0000 mg | Freq: Two times a day (BID) | INTRAVENOUS | Status: DC
  Administered 2021-09-06: 40 mg via INTRAVENOUS
  Filled 2021-09-02: qty 40

## 2021-09-02 NOTE — ED Provider Notes (Signed)
[facility] EMERGENCY DEPARTMENT Provider Note  CSN: [id number] Arrival date & time: 09/02/21 1921  History Chief Complaint  Patient presents with   Chest Pain    Randy Hebert is a 56 y.o. male with complex history of heart disease and recurrent pneumonia among other things reports he has had productive cough, chest soreness for the last 2-3 days associated with fever, nausea vomiting and diarrhea. He reports hematemesis and hematochezia today. No abdominal pain. Not on a blood thinner.    Home Medications Prior to Admission medications   Medication Sig Start Date End Date Taking? Authorizing Provider  acetaminophen (TYLENOL) 500 MG tablet Take 1,000 mg by mouth every 4 (four) hours as needed for mild pain or fever.     [provider]  albuterol (PROVENTIL) (2.5 MG/3ML) 0.083% nebulizer solution Take 3 mLs (2.5 mg total) by nebulization every 6 (six) hours as needed for wheezing or shortness of breath. 05/09/21   Noreene Larsson, NP  albuterol (VENTOLIN HFA) 108 (90 Base) MCG/ACT inhaler INHALE 2 PUFFS INTO THE LUNGS EVERY 6 HOURS AS NEEDEDFOR SHORTNESS OF BREATH OR WHEEZING. 08/08/21   Ambs, Kathrine Cords, FNP  amLODipine (NORVASC) 10 MG tablet TAKE 1 TABLET BY MOUTH ONCE A DAY. 05/27/21   Noreene Larsson, NP  ascorbic acid (VITAMIN C) 500 MG tablet Take 1 tablet (500 mg total) by mouth daily. 10/12/19   Barton Dubois, MD  aspirin 81 MG EC tablet Take 1 tablet (81 mg total) by mouth daily. Patient taking differently: Take 81 mg by mouth in the morning and at bedtime. 04/29/17   Cheryln Manly, NP  atorvastatin (LIPITOR) 40 MG tablet TAKE 1 TABLET BY MOUTH ONCE A DAY. 07/29/21   Lindell Spar, MD  Azelastine HCl 137 MCG/SPRAY SOLN USE 2 SPRAYS IN EACH NOSTRIL TWICE DAILY. 04/22/21   Valentina Shaggy, MD  Cholecalciferol (VITAMIN D3) 125 MCG (5000 UT) CAPS Take 1 capsule (5,000 Units total) by mouth daily. 07/19/19   Cassandria Anger, MD  citalopram (CELEXA) 20 MG tablet  Take 20 mg by mouth daily. 11/06/17   [provider]  cycloSPORINE (RESTASIS) 0.05 % ophthalmic emulsion Place 1 drop into both eyes 2 (two) times daily.    [provider]  dapagliflozin propanediol (FARXIGA) 10 MG TABS tablet Take 1 tablet (10 mg total) by mouth daily before breakfast. 06/12/21   Lindell Spar, MD  Dulaglutide (TRULICITY) 1.5 QJ/3.3LK SOPN Inject 1.5 mg into the skin once a week. 07/11/21   Cassandria Anger, MD  ferrous sulfate 325 (65 FE) MG tablet Take 1 tablet (325 mg total) by mouth daily with breakfast. 04/02/21   Noreene Larsson, NP  Fluticasone-Umeclidin-Vilant (TRELEGY ELLIPTA) 100-62.5-25 MCG/ACT AEPB Inhale 1 puff into the lungs daily. 07/24/21   Lindell Spar, MD  furosemide (LASIX) 40 MG tablet Take 1 tablet (40 mg total) by mouth daily as needed for fluid. 07/20/20   Evans Lance, MD  glipiZIDE (GLUCOTROL XL) 5 MG 24 hr tablet TAKE 1 TABLET BY MOUTH DAILY WITH BREAKFAST. 07/29/21   Cassandria Anger, MD  glucose blood (ACCU-CHEK AVIVA PLUS) test strip Use as instructed to check blood glucose three times daily. 07/22/21   Cassandria Anger, MD  magnesium gluconate (MAGONATE) 500 MG tablet Take 500 mg by mouth 2 (two) times daily.    [provider]  metFORMIN (GLUCOPHAGE) 500 MG tablet Take 1 tablet (500 mg total) by mouth 2 (two) times daily  with a meal. 07/05/21   Nida, Marella Chimes, MD  mometasone (ELOCON) 0.1 % cream Apply 1 application topically 2 (two) times a week.  10/21/18   [provider]  nitroGLYCERIN (NITROSTAT) 0.4 MG SL tablet Place 1 tablet (0.4 mg total) under the tongue every 5 (five) minutes as needed for chest pain. 07/20/20   Evans Lance, MD  olopatadine (PATANOL) 0.1 % ophthalmic solution 1 drop 2 (two) times daily. 10/23/20   [provider]  pantoprazole (PROTONIX) 40 MG tablet TAKE (1) TABLET BY MOUTH TWICE DAILY BEFORE A MEAL. 07/01/21   Erenest Rasher, PA-C  prednisoLONE  acetate (PRED FORTE) 1 % ophthalmic suspension Place 4 drops into both eyes 2 (two) times a week.  09/17/19   [provider]  Tiotropium Bromide Monohydrate (SPIRIVA RESPIMAT) 2.5 MCG/ACT AERS Inhale 1 puff into the lungs daily. 11/09/20   Dara Hoyer, FNP  triamcinolone ointment (KENALOG) 0.1 % Apply 1 application topically 2 (two) times daily. 01/11/21   Valentina Shaggy, MD     Allergies    Carvedilol, Lisinopril, Chantix [varenicline], and Amoxicillin   Review of Systems   Review of Systems Please see HPI for pertinent positives and negatives  Physical Exam BP (!) 88/50    Pulse 98    Temp 98.8 F (37.1 C) (Oral)    Resp (!) 35    Ht 5\' 5"  (1.651 m)    Wt 94.8 kg    SpO2 95%    BMI 34.78 kg/m   Physical Exam Vitals and nursing note reviewed.  Constitutional:      Appearance: Normal appearance.  HENT:     Head: Normocephalic and atraumatic.     Nose: Nose normal.     Mouth/Throat:     Mouth: Mucous membranes are moist.  Eyes:     Extraocular Movements: Extraocular movements intact.     Conjunctiva/sclera: Conjunctivae normal.  Cardiovascular:     Rate and Rhythm: Normal rate.  Pulmonary:     Effort: Pulmonary effort is normal.     Breath sounds: Rhonchi present. No rales.  Abdominal:     General: Abdomen is flat.     Palpations: Abdomen is soft.     Tenderness: There is no abdominal tenderness.  Musculoskeletal:        General: No swelling. Normal range of motion.     Cervical back: Neck supple.  Skin:    General: Skin is warm and dry.  Neurological:     General: No focal deficit present.     Mental Status: He is alert.  Psychiatric:        Mood and Affect: Mood normal.    ED Results / Procedures / Treatments   EKG EKG Interpretation  Date/Time:  Monday September 02 2021 19:41:13 EST Ventricular Rate:  99 PR Interval:  164 QRS Duration: 126 QT Interval:  362 QTC Calculation: 464 R Axis:   122 Text Interpretation: Rate faster Normal sinus  rhythm Right bundle branch block Lateral infarct , age undetermined Abnormal ECG When compared with ECG of 07-Oct-2019 13:41, Rate faster Confirmed by Calvert Cantor [phone]) on 09/02/2021 8:29:04 PM  Procedures .Critical Care Performed by: Truddie Hidden, MD Authorized by: Truddie Hidden, MD   Critical care provider statement:    Critical care time (minutes):  60   Critical care time was exclusive of:  Separately billable procedures and treating other patients   Critical care was necessary to treat or prevent imminent or  life-threatening deterioration of the following conditions:  Sepsis and shock   Critical care was time spent personally by me on the following activities:  Development of treatment plan with patient or surrogate, discussions with consultants, evaluation of patient's response to treatment, examination of patient, ordering and review of laboratory studies, ordering and review of radiographic studies, ordering and performing treatments and interventions, pulse oximetry, re-evaluation of patient's condition and review of old charts   Care discussed with: admitting provider    Medications Ordered in the ED Medications  lactated ringers infusion ( Intravenous New Bag/Given 09/02/21 2230)  cefTRIAXone (ROCEPHIN) 2 g in sodium chloride 0.9 % 100 mL IVPB (0 g Intravenous Stopped 09/02/21 2109)  azithromycin (ZITHROMAX) 500 mg in sodium chloride 0.9 % 250 mL IVPB (0 mg Intravenous Stopped 09/02/21 2234)  pantoprazole (PROTONIX) 80 mg /NS 100 mL IVPB (has no administration in time range)  pantoprozole (PROTONIX) 80 mg /NS 100 mL infusion (has no administration in time range)  pantoprazole (PROTONIX) injection 40 mg (has no administration in time range)  oseltamivir (TAMIFLU) capsule 75 mg (has no administration in time range)  lactated ringers bolus 1,000 mL (0 mLs Intravenous Stopped 09/02/21 2202)    And  lactated ringers bolus 1,000 mL (0 mLs Intravenous Stopped 09/02/21 2202)    And   lactated ringers bolus 1,000 mL (0 mLs Intravenous Stopped 09/02/21 2202)  ondansetron (ZOFRAN) injection 4 mg (4 mg Intravenous Given 09/02/21 2205)    Initial Impression and Plan  Patient with symptoms consistent with respiratory infection also noted to be borderline hypotensive and tachypneic on arrival. Meets Sepsis criteria. Sepsis order set initiated. LR bolus and CAP Abx ordered. Patient is well appearing in no distress.   ED Course   Clinical Course as of 09/02/21 2332  Mon Sep 02, 2021  2105 CBC with normal WBC, CMP shows elevated Gluc and bili, otherwise unremarkabel. Lactic acid is mildly elevated, will recheck after IVF bolus. INR is mildly increased. PTT is normal. Trop is also borderline elevated, No old to compare. Will add BNP given concerns for edema on CXR. No definite PNA.  [CS]  2118 BP is improving with IVF.  [CS]  2130 Influenza is positive. Likely the cause of his fever will recheck lactic acid and BP after IVF. May not require further Abx.  [CS]  2156 Patient has vomited on the floor, dark brown/rust colored. Will send for gastroccult.  [CS]  2158 BNP is normal.  [CS]  2228 Per EMT, patient had a loose bloody stool. Will check type and screen.  [CS]  2240 Patient with positive hemoccult and gastric occult. Will begin Protonix and discuss with GI [CS]  2304 Spoke with Dr. Laural Golden, GI, who agrees with protonix drip, admission and he will consult in the AM.  [CS]  2328 Spoke with Dr. Josephine Cables, Hospitalist, who notes that most recent documented BP is 75. The patient is sitting up on the edge of the bed without dizziness or symptoms of low BP. Asked RN to recheck BP and let us know.  [CS]    Clinical Course User Index [CS] Truddie Hidden, MD     MDM Rules/Calculators/A&P Medical Decision Making Problems Addressed: Influenza A: acute illness or injury with systemic symptoms Upper GI bleed: acute illness or injury that poses a threat to life or bodily  functions  Amount and/or Complexity of Data Reviewed Labs: ordered. Decision-making details documented in ED Course. Radiology: ordered and independent interpretation performed. Decision-making details documented in ED  Course. ECG/medicine tests: ordered and independent interpretation performed.  Risk Prescription drug management. Decision regarding hospitalization.    Final Clinical Impression(s) / ED Diagnoses Final diagnoses:  Influenza A  Upper GI bleed    Rx / DC Orders ED Discharge Orders     None        Truddie Hidden, MD 09/02/21 2332

## 2021-09-02 NOTE — ED Notes (Signed)
Pt placed on 2L, states he feel SOB. Oxygen 91% and above. Pt very working to breathing with fast respirations.

## 2021-09-02 NOTE — ED Notes (Signed)
Patient transported to X-ray 

## 2021-09-02 NOTE — ED Notes (Signed)
Pt vomited large amount of dark brown vomit on the floor. EDP made aware and assessed pt. Sample sent to lab.

## 2021-09-02 NOTE — ED Notes (Signed)
Dr. Karle Starch to triage to evaluate patient.

## 2021-09-02 NOTE — Sepsis Progress Note (Signed)
Monitoring for the code sepsis protocol. °

## 2021-09-02 NOTE — Sepsis Progress Note (Signed)
Notified bedside nurse of need to draw repeat lactic acid. 

## 2021-09-02 NOTE — ED Triage Notes (Signed)
Pt brought in by [facility] for co chest pain. Per EMS EKG did not show st elevation. Pt routed to triage by charge nurse. Chest pain primarily when he coughs and started yesterday. + Cough, +sob up to 100.8, + n/v/d x 2 hours. Reports throwing up blood and noted blood in feces.

## 2021-09-02 NOTE — ED Notes (Signed)
Pt had very large bowel movement that was dark brown liquid containing blood. Edp made aware and occult done and was positive.

## 2021-09-02 NOTE — ED Provider Notes (Signed)
°  Provider Note MRN:  [id number]  Arrival date & time: 09/03/21    ED Course and Medical Decision Making  Assumed care from Dr. Karle Starch at shift change.  Shortness of breath, new oxygen requirement, flu positive, also with concern for GI bleeding.  Some soft blood pressures documented the patient is very well-appearing, repeating H&H, plan is to admit.   Called to bedside for hypotension, on my evaluation patient seems poorly perfused, poor color, a bit somnolent, laying flat in the bed.  Hypotensive despite 3 L of crystalloid.  At bedside he has a bedside commode that is full of bloody vomit/stool.  Concern for hemorrhagic shock, starting emergent blood transfusion.  Upon chart review patient has history of cirrhosis, history of varices.  Starting octreotide, has already received ceftriaxone.  Discussed again with Dr. Jearld Adjutant, who agrees with octreotide, plan is to give octreotide chance to stop the bleeding, will consider procedural intervention in the morning.  3 AM update: Patient's clinical status improving, seems to be appropriately resuscitated.  Admitted to ICU.  Marland KitchenCritical Care Performed by: Maudie Flakes, MD Authorized by: Maudie Flakes, MD   Critical care provider statement:    Critical care time (minutes):  35   Critical care was necessary to treat or prevent imminent or life-threatening deterioration of the following conditions:  Shock   Critical care was time spent personally by me on the following activities:  Development of treatment plan with patient or surrogate, discussions with consultants, evaluation of patient's response to treatment, examination of patient, ordering and review of laboratory studies, ordering and review of radiographic studies, ordering and performing treatments and interventions, pulse oximetry, re-evaluation of patient's condition and review of old charts  Final Clinical Impressions(s) / ED Diagnoses     ICD-10-CM   1. Influenza A  J10.1     2.  Upper GI bleed  K92.2       ED Discharge Orders     None       Discharge Instructions   None     Barth Kirks. [place] Small, [place] [email or web address]    Maudie Flakes, MD 09/03/21 [phone]

## 2021-09-02 NOTE — ED Triage Notes (Signed)
EKG completed in triage. Pt has saline lock in left arm.

## 2021-09-02 NOTE — ED Notes (Signed)
Pt placed in Vertical 2 per direction of charge.

## 2021-09-03 ENCOUNTER — Encounter (HOSPITAL_COMMUNITY): Payer: Self-pay | Admitting: Anesthesiology

## 2021-09-03 ENCOUNTER — Telehealth: Payer: Medicare Other

## 2021-09-03 ENCOUNTER — Encounter (HOSPITAL_COMMUNITY)
Admission: EM | Disposition: A | Discharge status: Home / Self Care | Payer: Self-pay | Source: Home / Self Care | Attending: Internal Medicine

## 2021-09-03 ENCOUNTER — Inpatient Hospital Stay (HOSPITAL_COMMUNITY): Payer: Medicare Other

## 2021-09-03 DIAGNOSIS — I959 Hypotension, unspecified: Secondary | ICD-10-CM | POA: Diagnosis not present

## 2021-09-03 DIAGNOSIS — I110 Hypertensive heart disease with heart failure: Secondary | ICD-10-CM | POA: Diagnosis present

## 2021-09-03 DIAGNOSIS — G4733 Obstructive sleep apnea (adult) (pediatric): Secondary | ICD-10-CM | POA: Diagnosis not present

## 2021-09-03 DIAGNOSIS — E8809 Other disorders of plasma-protein metabolism, not elsewhere classified: Secondary | ICD-10-CM

## 2021-09-03 DIAGNOSIS — J9601 Acute respiratory failure with hypoxia: Secondary | ICD-10-CM

## 2021-09-03 DIAGNOSIS — J984 Other disorders of lung: Secondary | ICD-10-CM | POA: Diagnosis not present

## 2021-09-03 DIAGNOSIS — K449 Diaphragmatic hernia without obstruction or gangrene: Secondary | ICD-10-CM | POA: Diagnosis not present

## 2021-09-03 DIAGNOSIS — I5022 Chronic systolic (congestive) heart failure: Secondary | ICD-10-CM | POA: Diagnosis not present

## 2021-09-03 DIAGNOSIS — I864 Gastric varices: Secondary | ICD-10-CM | POA: Diagnosis present

## 2021-09-03 DIAGNOSIS — K7689 Other specified diseases of liver: Secondary | ICD-10-CM | POA: Diagnosis not present

## 2021-09-03 DIAGNOSIS — R0602 Shortness of breath: Secondary | ICD-10-CM | POA: Diagnosis not present

## 2021-09-03 DIAGNOSIS — R778 Other specified abnormalities of plasma proteins: Secondary | ICD-10-CM

## 2021-09-03 DIAGNOSIS — J101 Influenza due to other identified influenza virus with other respiratory manifestations: Secondary | ICD-10-CM | POA: Diagnosis not present

## 2021-09-03 DIAGNOSIS — R197 Diarrhea, unspecified: Secondary | ICD-10-CM | POA: Diagnosis not present

## 2021-09-03 DIAGNOSIS — E872 Acidosis, unspecified: Secondary | ICD-10-CM | POA: Diagnosis not present

## 2021-09-03 DIAGNOSIS — E441 Mild protein-calorie malnutrition: Secondary | ICD-10-CM | POA: Diagnosis not present

## 2021-09-03 DIAGNOSIS — F1010 Alcohol abuse, uncomplicated: Secondary | ICD-10-CM | POA: Diagnosis present

## 2021-09-03 DIAGNOSIS — I8500 Esophageal varices without bleeding: Secondary | ICD-10-CM | POA: Diagnosis not present

## 2021-09-03 DIAGNOSIS — K7030 Alcoholic cirrhosis of liver without ascites: Secondary | ICD-10-CM | POA: Diagnosis present

## 2021-09-03 DIAGNOSIS — I10 Essential (primary) hypertension: Secondary | ICD-10-CM | POA: Diagnosis not present

## 2021-09-03 DIAGNOSIS — K922 Gastrointestinal hemorrhage, unspecified: Secondary | ICD-10-CM | POA: Diagnosis not present

## 2021-09-03 DIAGNOSIS — I700 Atherosclerosis of aorta: Secondary | ICD-10-CM | POA: Diagnosis not present

## 2021-09-03 DIAGNOSIS — I8510 Secondary esophageal varices without bleeding: Secondary | ICD-10-CM | POA: Diagnosis not present

## 2021-09-03 DIAGNOSIS — K5730 Diverticulosis of large intestine without perforation or abscess without bleeding: Secondary | ICD-10-CM | POA: Diagnosis not present

## 2021-09-03 DIAGNOSIS — E46 Unspecified protein-calorie malnutrition: Secondary | ICD-10-CM

## 2021-09-03 DIAGNOSIS — E875 Hyperkalemia: Secondary | ICD-10-CM | POA: Diagnosis not present

## 2021-09-03 DIAGNOSIS — E7220 Disorder of urea cycle metabolism, unspecified: Secondary | ICD-10-CM | POA: Diagnosis not present

## 2021-09-03 DIAGNOSIS — E669 Obesity, unspecified: Secondary | ICD-10-CM | POA: Diagnosis present

## 2021-09-03 DIAGNOSIS — E782 Mixed hyperlipidemia: Secondary | ICD-10-CM

## 2021-09-03 DIAGNOSIS — K921 Melena: Secondary | ICD-10-CM | POA: Diagnosis not present

## 2021-09-03 DIAGNOSIS — I2510 Atherosclerotic heart disease of native coronary artery without angina pectoris: Secondary | ICD-10-CM | POA: Diagnosis present

## 2021-09-03 DIAGNOSIS — K2270 Barrett's esophagus without dysplasia: Secondary | ICD-10-CM | POA: Diagnosis present

## 2021-09-03 DIAGNOSIS — K920 Hematemesis: Secondary | ICD-10-CM | POA: Diagnosis not present

## 2021-09-03 DIAGNOSIS — J449 Chronic obstructive pulmonary disease, unspecified: Secondary | ICD-10-CM | POA: Diagnosis not present

## 2021-09-03 DIAGNOSIS — K766 Portal hypertension: Secondary | ICD-10-CM | POA: Diagnosis not present

## 2021-09-03 DIAGNOSIS — J9811 Atelectasis: Secondary | ICD-10-CM | POA: Diagnosis not present

## 2021-09-03 DIAGNOSIS — D62 Acute posthemorrhagic anemia: Secondary | ICD-10-CM | POA: Diagnosis not present

## 2021-09-03 DIAGNOSIS — K3189 Other diseases of stomach and duodenum: Secondary | ICD-10-CM | POA: Diagnosis not present

## 2021-09-03 DIAGNOSIS — Z20822 Contact with and (suspected) exposure to covid-19: Secondary | ICD-10-CM | POA: Diagnosis not present

## 2021-09-03 DIAGNOSIS — J811 Chronic pulmonary edema: Secondary | ICD-10-CM | POA: Diagnosis not present

## 2021-09-03 DIAGNOSIS — J302 Other seasonal allergic rhinitis: Secondary | ICD-10-CM | POA: Diagnosis not present

## 2021-09-03 DIAGNOSIS — I8511 Secondary esophageal varices with bleeding: Secondary | ICD-10-CM | POA: Diagnosis not present

## 2021-09-03 DIAGNOSIS — E1165 Type 2 diabetes mellitus with hyperglycemia: Secondary | ICD-10-CM | POA: Diagnosis present

## 2021-09-03 DIAGNOSIS — F17210 Nicotine dependence, cigarettes, uncomplicated: Secondary | ICD-10-CM | POA: Diagnosis present

## 2021-09-03 DIAGNOSIS — K7460 Unspecified cirrhosis of liver: Secondary | ICD-10-CM | POA: Diagnosis not present

## 2021-09-03 DIAGNOSIS — J441 Chronic obstructive pulmonary disease with (acute) exacerbation: Secondary | ICD-10-CM | POA: Diagnosis not present

## 2021-09-03 LAB — COMPREHENSIVE METABOLIC PANEL
ALT: 31 U/L (ref 0–44)
AST: 28 U/L (ref 15–41)
Albumin: 2.9 g/dL — ABNORMAL LOW (ref 3.5–5.0)
Alkaline Phosphatase: 80 U/L (ref 38–126)
Anion gap: 7 (ref 5–15)
BUN: 52 mg/dL — ABNORMAL HIGH (ref 6–20)
CO2: 24 mmol/L (ref 22–32)
Calcium: 7.9 mg/dL — ABNORMAL LOW (ref 8.9–10.3)
Chloride: 108 mmol/L (ref 98–111)
Creatinine, Ser: 0.9 mg/dL (ref 0.61–1.24)
GFR, Estimated: >60 mL/min (ref >60)
Glucose, Bld: 277 mg/dL — ABNORMAL HIGH (ref 70–99)
Potassium: 5.1 mmol/L (ref 3.5–5.1)
Sodium: 139 mmol/L (ref 135–145)
Total Bilirubin: 1.3 mg/dL — ABNORMAL HIGH (ref 0.3–1.2)
Total Protein: 5.5 g/dL — ABNORMAL LOW (ref 6.5–8.1)

## 2021-09-03 LAB — BPAM RBC
Blood Product Expiration Date: 202301182359
Blood Product Expiration Date: 202301182359
ISSUE DATE / TIME: 202301022358
ISSUE DATE / TIME: 202301022358
Unit Type and Rh: 9500
Unit Type and Rh: 9500

## 2021-09-03 LAB — TYPE AND SCREEN
ABO/RH(D): O POS
Antibody Screen: NEGATIVE
Unit division: 0
Unit division: 0

## 2021-09-03 LAB — LACTIC ACID, PLASMA
Lactic Acid, Venous: 1.5 mmol/L (ref 0.5–1.9)
Lactic Acid, Venous: 3 mmol/L (ref 0.5–1.9)

## 2021-09-03 LAB — MRSA NEXT GEN BY PCR, NASAL: MRSA by PCR Next Gen: NOT DETECTED

## 2021-09-03 LAB — CBG MONITORING, ED
Glucose-Capillary: 233 mg/dL — ABNORMAL HIGH (ref 70–99)
Glucose-Capillary: 215 mg/dL — ABNORMAL HIGH (ref 70–99)

## 2021-09-03 LAB — BLOOD GAS, VENOUS
Acid-base deficit: 0.6 mmol/L (ref 0.0–2.0)
Bicarbonate: 22.3 mmol/L (ref 20.0–28.0)
Drawn by: 6381
FIO2: 28
O2 Saturation: 73.7 %
Patient temperature: 36.9
pCO2, Ven: 62.9 mmHg — ABNORMAL HIGH (ref 44.0–60.0)
pH, Ven: 7.239 — ABNORMAL LOW (ref 7.250–7.430)
pO2, Ven: 49.8 mmHg — ABNORMAL HIGH (ref 32.0–45.0)

## 2021-09-03 LAB — HEMOGLOBIN AND HEMATOCRIT, BLOOD
HCT: 29.5 % — ABNORMAL LOW (ref 39.0–52.0)
HCT: 30.3 % — ABNORMAL LOW (ref 39.0–52.0)
HCT: 35.1 % — ABNORMAL LOW (ref 39.0–52.0)
Hemoglobin: 11.5 g/dL — ABNORMAL LOW (ref 13.0–17.0)
Hemoglobin: 9.5 g/dL — ABNORMAL LOW (ref 13.0–17.0)
Hemoglobin: 9.9 g/dL — ABNORMAL LOW (ref 13.0–17.0)

## 2021-09-03 LAB — AMMONIA: Ammonia: 75 umol/L — ABNORMAL HIGH (ref 9–35)

## 2021-09-03 LAB — GLUCOSE, CAPILLARY
Glucose-Capillary: 210 mg/dL — ABNORMAL HIGH (ref 70–99)
Glucose-Capillary: 145 mg/dL — ABNORMAL HIGH (ref 70–99)
Glucose-Capillary: 180 mg/dL — ABNORMAL HIGH (ref 70–99)
Glucose-Capillary: 147 mg/dL — ABNORMAL HIGH (ref 70–99)

## 2021-09-03 LAB — PHOSPHORUS: Phosphorus: 3.9 mg/dL (ref 2.5–4.6)

## 2021-09-03 LAB — CBC
HCT: 34.6 % — ABNORMAL LOW (ref 39.0–52.0)
Hemoglobin: 11.3 g/dL — ABNORMAL LOW (ref 13.0–17.0)
MCH: 30.4 pg (ref 26.0–34.0)
MCHC: 32.7 g/dL (ref 30.0–36.0)
MCV: 93 fL (ref 80.0–100.0)
Platelets: 168 10*3/uL (ref 150–400)
RBC: 3.72 MIL/uL — ABNORMAL LOW (ref 4.22–5.81)
RDW: 14 % (ref 11.5–15.5)
WBC: 8.7 10*3/uL (ref 4.0–10.5)
nRBC: 0 % (ref 0.0–0.2)

## 2021-09-03 LAB — HIV ANTIBODY (ROUTINE TESTING W REFLEX): HIV Screen 4th Generation wRfx: NONREACTIVE

## 2021-09-03 LAB — TROPONIN I (HIGH SENSITIVITY): Troponin I (High Sensitivity): 20 ng/L — ABNORMAL HIGH (ref <18)

## 2021-09-03 LAB — PROTIME-INR
INR: 1.1 (ref 0.8–1.2)
Prothrombin Time: 14.7 seconds (ref 11.4–15.2)

## 2021-09-03 LAB — HEMOGLOBIN A1C
Hgb A1c MFr Bld: 6.8 % — ABNORMAL HIGH (ref 4.8–5.6)
Mean Plasma Glucose: 148.46 mg/dL

## 2021-09-03 LAB — PROCALCITONIN: Procalcitonin: 0.19 ng/mL

## 2021-09-03 LAB — MAGNESIUM: Magnesium: 1.6 mg/dL — ABNORMAL LOW (ref 1.7–2.4)

## 2021-09-03 SURGERY — ESOPHAGOGASTRODUODENOSCOPY (EGD) WITH PROPOFOL
Anesthesia: Monitor Anesthesia Care

## 2021-09-03 MED ORDER — UMECLIDINIUM BROMIDE 62.5 MCG/ACT IN AEPB
1.0000 | INHALATION_SPRAY | Freq: Every day | RESPIRATORY_TRACT | Status: DC
  Administered 2021-09-03 – 2021-09-06 (×4): 1 via RESPIRATORY_TRACT
  Filled 2021-09-03: qty 7

## 2021-09-03 MED ORDER — IOHEXOL 350 MG/ML SOLN
100.0000 mL | Freq: Once | INTRAVENOUS | Status: AC | PRN
  Administered 2021-09-03: 100 mL via INTRAVENOUS

## 2021-09-03 MED ORDER — ALBUTEROL SULFATE HFA 108 (90 BASE) MCG/ACT IN AERS: 2.0000 | INHALATION_SPRAY | Freq: Four times a day (QID) | RESPIRATORY_TRACT | Status: DC | PRN

## 2021-09-03 MED ORDER — OCTREOTIDE ACETATE 500 MCG/ML IJ SOLN
INTRAMUSCULAR | Status: AC
  Filled 2021-09-03: qty 1

## 2021-09-03 MED ORDER — CHLORHEXIDINE GLUCONATE CLOTH 2 % EX PADS
6.0000 | MEDICATED_PAD | Freq: Every day | CUTANEOUS | Status: DC
  Administered 2021-09-03 – 2021-09-06 (×4): 6 via TOPICAL

## 2021-09-03 MED ORDER — ALBUTEROL SULFATE (2.5 MG/3ML) 0.083% IN NEBU: 2.5000 mg | INHALATION_SOLUTION | Freq: Four times a day (QID) | RESPIRATORY_TRACT | Status: DC | PRN

## 2021-09-03 MED ORDER — SODIUM CHLORIDE 0.9 % IV SOLN
1.0000 g | INTRAVENOUS | Status: DC
  Administered 2021-09-03: 1 g via INTRAVENOUS
  Filled 2021-09-03: qty 10

## 2021-09-03 MED ORDER — OSELTAMIVIR PHOSPHATE 75 MG PO CAPS: 75.0000 mg | ORAL_CAPSULE | Freq: Two times a day (BID) | ORAL | Status: DC

## 2021-09-03 MED ORDER — FLUTICASONE FUROATE-VILANTEROL 100-25 MCG/ACT IN AEPB
1.0000 | INHALATION_SPRAY | Freq: Every day | RESPIRATORY_TRACT | Status: DC
  Administered 2021-09-03 – 2021-09-06 (×4): 1 via RESPIRATORY_TRACT
  Filled 2021-09-03: qty 28

## 2021-09-03 MED ORDER — FLUTICASONE-UMECLIDIN-VILANT 100-62.5-25 MCG/ACT IN AEPB: 1.0000 | INHALATION_SPRAY | Freq: Every day | RESPIRATORY_TRACT | Status: DC

## 2021-09-03 MED ORDER — INFLUENZA VAC SPLIT QUAD 0.5 ML IM SUSY: 0.5000 mL | PREFILLED_SYRINGE | INTRAMUSCULAR | Status: DC

## 2021-09-03 MED ORDER — GUAIFENESIN-DM 100-10 MG/5ML PO SYRP
5.0000 mL | ORAL_SOLUTION | ORAL | Status: DC | PRN
  Administered 2021-09-03 – 2021-09-04 (×2): 5 mL via ORAL
  Filled 2021-09-03 (×3): qty 5

## 2021-09-03 MED ORDER — ATORVASTATIN CALCIUM 40 MG PO TABS
40.0000 mg | ORAL_TABLET | Freq: Every day | ORAL | Status: DC
  Administered 2021-09-03 – 2021-09-05 (×3): 40 mg via ORAL
  Filled 2021-09-03 (×4): qty 1

## 2021-09-03 MED ORDER — LACTATED RINGERS IV BOLUS
1000.0000 mL | Freq: Once | INTRAVENOUS | Status: AC
  Administered 2021-09-03: 1000 mL via INTRAVENOUS

## 2021-09-03 MED ORDER — SODIUM CHLORIDE 0.9 % IV SOLN: 2.0000 g | INTRAVENOUS | Status: DC

## 2021-09-03 MED ORDER — LACTATED RINGERS IV SOLN: INTRAVENOUS | Status: DC

## 2021-09-03 MED ORDER — METOCLOPRAMIDE HCL 5 MG/ML IJ SOLN
10.0000 mg | Freq: Once | INTRAMUSCULAR | Status: AC
  Administered 2021-09-03: 10 mg via INTRAVENOUS
  Filled 2021-09-03: qty 2

## 2021-09-03 MED ORDER — MAGNESIUM SULFATE 2 GM/50ML IV SOLN
2.0000 g | Freq: Once | INTRAVENOUS | Status: AC
  Administered 2021-09-03: 2 g via INTRAVENOUS
  Filled 2021-09-03: qty 50

## 2021-09-03 MED ORDER — ASPIRIN EC 81 MG PO TBEC: 81.0000 mg | DELAYED_RELEASE_TABLET | Freq: Every day | ORAL | Status: DC

## 2021-09-03 MED ORDER — LACTULOSE 10 GM/15ML PO SOLN
20.0000 g | Freq: Three times a day (TID) | ORAL | Status: DC
  Administered 2021-09-03 – 2021-09-05 (×7): 20 g via ORAL
  Filled 2021-09-03 (×7): qty 30

## 2021-09-03 MED ORDER — SODIUM CHLORIDE 0.9 % IV SOLN: INTRAVENOUS | Status: DC | PRN

## 2021-09-03 MED ORDER — INSULIN ASPART 100 UNIT/ML IJ SOLN
0.0000 [IU] | INTRAMUSCULAR | Status: DC
  Administered 2021-09-03: 17:00:00 2 [IU] via SUBCUTANEOUS
  Administered 2021-09-03: 12:00:00 5 [IU] via SUBCUTANEOUS
  Administered 2021-09-03: 20:00:00 3 [IU] via SUBCUTANEOUS
  Administered 2021-09-03: 10:00:00 5 [IU] via SUBCUTANEOUS
  Administered 2021-09-04: 2 [IU] via SUBCUTANEOUS
  Administered 2021-09-04: 20:00:00 3 [IU] via SUBCUTANEOUS
  Administered 2021-09-04: 2 [IU] via SUBCUTANEOUS
  Administered 2021-09-04: 3 [IU] via SUBCUTANEOUS
  Administered 2021-09-04: 09:00:00 5 [IU] via SUBCUTANEOUS
  Administered 2021-09-05 (×2): 2 [IU] via SUBCUTANEOUS
  Administered 2021-09-05: 3 [IU] via SUBCUTANEOUS
  Filled 2021-09-03: qty 1

## 2021-09-03 MED ORDER — OSELTAMIVIR PHOSPHATE 75 MG PO CAPS
75.0000 mg | ORAL_CAPSULE | Freq: Two times a day (BID) | ORAL | Status: DC
  Administered 2021-09-03 – 2021-09-05 (×6): 75 mg via ORAL
  Filled 2021-09-03 (×7): qty 1

## 2021-09-03 MED ORDER — GLUCERNA SHAKE PO LIQD
237.0000 mL | Freq: Three times a day (TID) | ORAL | Status: DC
  Administered 2021-09-03 – 2021-09-06 (×3): 237 mL via ORAL
  Filled 2021-09-03 (×5): qty 237

## 2021-09-03 NOTE — ED Notes (Addendum)
Pt had very large bowel movement of blood. Pressure is stable. EDP made aware and assessed pt. Hospitalist paged about incident and awaiting to hear orders. Will continue to monitor.

## 2021-09-03 NOTE — ED Notes (Signed)
Requested new bag of octreotide and protonix from pharmacy. Not loaded in pyxis

## 2021-09-03 NOTE — ED Notes (Addendum)
Emergency blood consent done verbally with pt and EDP. This RN witnessed.

## 2021-09-03 NOTE — TOC Progression Note (Signed)
Transition of Care [facility]) - Progression Note    Patient Details  Name: BONHAM ZINGALE MRN: [id number] Date of Birth: Sep 02, 1965  Transition of Care [facility]) CM/SW Contact  Salome Arnt, [place] Phone Number: 09/03/2021, 10:46 AM  Clinical Narrative:   Transition of Care [facility]) Screening Note   Patient Details  Name: EBBIE CHERRY Date of Birth: 1965-09-25   Transition of Care [facility]) CM/SW Contact:    Salome Arnt, [place] Phone Number: 09/03/2021, 10:46 AM    Transition of Care Department [facility]) has reviewed patient and no TOC needs have been identified at this time. We will continue to monitor patient advancement through interdisciplinary progression rounds. If new patient transition needs arise, please place a TOC consult.         Barriers to Discharge: Continued Medical Work up  Expected Discharge Plan and Services                                                 Social Determinants of Health (SDOH) Interventions    Readmission Risk Interventions No flowsheet data found.

## 2021-09-03 NOTE — Progress Notes (Signed)
Patient had on nasal pillows with cpap machine, but mask was not adequate to keep up pressure and O2 demands.   Changed patient to full face mask and sat is staying up on 2L and auto titrating between 10 and 18 on pressure.  Patient is no longer dipping into 70s and 80s as before.

## 2021-09-03 NOTE — ED Notes (Signed)
This RN noticed pt blood pressure trending low. Assessed pt and notified EDP. EDP ordered emergency blood 2 units for pt. Pt other vitals WNL. EDP assessed pt and amount of bloody stool in commode.   Pt with history of CHF but per EDP run blood in fast and emergently.

## 2021-09-03 NOTE — Consult Note (Addendum)
$'@LOGO'N$ @   Referring Provider: [facility]  Primary Care Physician:  Lindell Spar, MD Primary Gastroenterologist:  Dr. Gala Romney  Date of Admission: 09/02/21 Date of Consultation: 09/03/21  Reason for Consultation:  GI bleed, hematochezia  HPI:  Randy Hebert is a 56 y.o. year old male with medical history significant for essential hypertension, T2DM, CAD, STEMI (2016), NSTEMI (2018), CHF, hyperlipidemia, COPD, cirrhosis due to fatty liver and EtOH, Barrett's esophagus, adenomatous colon polyps who presented to the emergency department via EMS due to 2-day onset of chest soreness which occurred primarily when coughing, fever, associated nausea, vomiting, diarrhea which started about 2 hours prior to arrival.  Also endorsed vomiting blood and blood in his stool per H&P.  ED course: Intermittently tachypneic, BP soft at 89/54, O2 saturations 91% on room air, temperature 98.8. He was found to have normocytic anemia (Hg 12.9), hyperkalemia (5.5), hyperglycemia (315), BUN 52, creatinine 1.17, albumin 3.4, total bili 1.3 AST, ALT, alk phos within normal limits, INR 1.2, FOBT positive, lactic acid 3.2, ammonia 75. Troponin elevated at 26>>20 Blood cultures drawn and pending Influenza A positive.  Influenza B and SARS coronavirus 2 negative. Chest x-ray with cardiomegaly, peribronchial thickening which may be bronchitis or congestive, subsegmental opacities in the bases favoring atelectasis or scarring. Patient had 2 large bloody bowel movements in the emergency room and hemoglobin dropped to 9.5 within a few hours of arrival, associated hypotension.  He was transfused 2 units PRBCs emergently, started on IV fluids, IV ceftriaxone and azithromycin, and IV Protonix and octreotide drip.  Also started on Tamiflu and lactulose.  Hemoglobin this morning 11.3.   Consult:  2 bloody Bms this morning per nursing staff (between 5-7 this morning)  Patient reports being in his usual state of health until  09/01/2021 when he had 3 episodes of bright red blood per rectum which is new for him.  This resolved and his bowel movements returned to normal.  On 1/2, he started having nonproductive cough with associated mild generalized abdominal pain, decreased appetite.  Reports his cough was initially nonproductive and later Became productive with phlegm that was mixed with bright red blood.  Denies nausea or vomiting or bloody bowel movements on 1/2 until he presented to the emergency room.  He actually called EMS to bring him to the emergency department as he was feeling hot, sweaty, having chest pressure, feeling presyncopal, disoriented/confused, and with blurry vision.  Since he presented to the emergency room, he reports he had a total of 5-6 bloody BMs that are loose.  Two this morning.  Denies melena.  States stools are red to dark red. Denies history of GI bleed and reports his stools have been entirely normal, soft, and formed until this acute event. Denies abdominal pain now, just mild soreness with coughing. Like he did some situps.   Confusion resolved.  Denies abdominal distension or peripheral edema. Denies lightheadedness or presyncope now.  Mild shortness of breath, currently on 3.5 L nasal cannula, usually does not require supplemental oxygen at home.  Chronic GERD is well controlled on Protonix 40 mg twice daily. No dysphagia.   NSAIDs: None.  Alcohol: "None lately". Last drank 3-4 weeks ago.   MELD Na: 10  Most recent endoscopic procedures:  EGD March 2021 with salmon-colored mucosa in the esophagus biopsied (consistent with Barrett's esophagus without dysplasia), ?  Esophageal varices, small hiatal hernia, portal gastropathy, ?  Gastric varices, normal examined duodenum.  Colonoscopy March 2018 with diverticulosis in the sigmoid and  descending colon, 4 polyps in the sigmoid colon that were 4-6 mm in size resected and retrieved, 4 mm polyp in the ascending colon resected and retrieved  pathology revealed tubular adenomas and hyperplastic polyp.  Recommended repeat colonoscopy in 5 years.   Past Medical History:  Diagnosis Date   ACE inhibitor-aggravated angioedema    Allergy    Angio-edema    Anxiety    Arthritis    Asthma    hip replacement   Back pain    Bradycardia 04/28/2017   Bulging of cervical intervertebral disc    CAD (coronary artery disease)    lateral STEMI 02/06/2015 00% D1 occlusion treated with Promus Premier 2.5 mm x 16 mm DES, 70% ramus stenosis, 40% mid RCA stenosis, 45% distal RCA stenosis, EF 45-50%   CHF (congestive heart failure) (HCC)    COPD (chronic obstructive pulmonary disease) ([place])    Depression    Diabetes mellitus without complication ([place])    Difficult intubation    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   Dry eye    Dyspnea    Early satiety 09/23/2016   GERD (gastroesophageal reflux disease)    HCAP (healthcare-associated pneumonia) 05/15/2017   Headache    Heart murmur    Hip pain    Hyperlipidemia    Hypertension    Lobar pneumonia ([place]) 05/15/2017   Melena 08/04/2018   MI (myocardial infarction) ([place])    Myocardial infarction ([place])    Neck pain    Non-ST elevation (NSTEMI) myocardial infarction ([place]) 04/27/2017   NSTEMI (non-ST elevated myocardial infarction) ([place]) 04/26/2017   Otitis media    Pleurisy    Pneumonia due to COVID-19 virus 10/07/2019   Rectal bleeding 11/08/2018   Right shoulder pain 03/20/2020   Sinus pause    9 sec sinus pause on telemetry after started on coreg after MI, avoid AV nodal blocking agent   Sleep apnea    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   STEMI (ST elevation myocardial infarction) ([place]) 05/31/2015   Substance abuse ([place]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   Syncope 57/09/7791   Syncope and collapse 05/15/2017   Transaminitis 08/04/2018   Unilateral primary osteoarthritis, right hip 07/01/2016    Past Surgical History:  Procedure Laterality Date   BIOPSY  10/09/2016   Procedure:  BIOPSY;  Surgeon: Daneil Dolin, MD;  [facility];  Service: Endoscopy;;   BIOPSY  11/28/2019   Procedure: BIOPSY;  Surgeon: Daneil Dolin, MD;  [facility];  Service: Endoscopy;;   CARDIAC CATHETERIZATION N/A 02/06/2015   Procedure: Left Heart Cath and Coronary Angiography;  Surgeon: Leonie Man, MD;  Location: [facility];  Service: Cardiovascular;  Laterality: N/A;   CARDIAC CATHETERIZATION N/A 02/06/2015   Procedure: Coronary Stent Intervention;  Surgeon: Leonie Man, MD;  Location: [facility];  Service: Cardiovascular;  Laterality: N/A;   COLONOSCOPY WITH PROPOFOL N/A 10/09/2016   Sigmoid and descending colon diverticulosis, four 4-6 mm polyps in sigmoid, one 4 mm polyp in descending. Tubular adenomas and hyperplastic. 5 year surveillance.    COLONOSCOPY WITH PROPOFOL N/A 11/24/2016   Sigmoid and descending colon diverticulosis, four 4-6 mm polyps in sigmoid, one 4 mm polyp in descending. Tubular adenomas and hyperplastic. 5 year surveillance.    CORONARY ANGIOPLASTY WITH STENT PLACEMENT  01/2015   CORONARY STENT INTERVENTION N/A 04/27/2017   Procedure: CORONARY STENT INTERVENTION;  Surgeon: Nelva Bush, MD;  Location: [place] [facility];  Service: Cardiovascular;  Laterality: N/A;   ELECTROPHYSIOLOGY STUDY N/A 06/29/2017   Procedure: ELECTROPHYSIOLOGY STUDY;  Surgeon: Evans Lance, MD;  Location: [facility];  Service: Cardiovascular;  Laterality: N/A;   ESOPHAGOGASTRODUODENOSCOPY (EGD) WITH PROPOFOL N/A 10/09/2016   Dr. Gala Romney: LA grade a esophagitis.  Barrett's esophagus, biopsy-proven.  Small hiatal hernia.  EGD February 2019   ESOPHAGOGASTRODUODENOSCOPY (EGD) WITH PROPOFOL N/A 11/28/2019    salmon-colored esophageal mucosa (Barrett's) small hiatal hernia, portal hypertensive gastropathy, normal duodenum, 3 year surveillance   INCISION / DRAINAGE HAND / FINGER     LEFT HEART CATH AND CORONARY ANGIOGRAPHY N/A 04/27/2017   Procedure:  LEFT HEART CATH AND CORONARY ANGIOGRAPHY;  Surgeon: Nelva Bush, MD;  Location: [facility] [facility];  Service: Cardiovascular;  Laterality: N/A;   LOOP RECORDER INSERTION  06/29/2017   Procedure: Loop Recorder Insertion;  Surgeon: Evans Lance, MD;  Location: [place];  Service: Cardiovascular;;   POLYPECTOMY  11/24/2016   Procedure: POLYPECTOMY;  Surgeon: Daneil Dolin, MD;  [facility];  Service: Endoscopy;;  descending and sigmoid   TOTAL HIP ARTHROPLASTY Right 07/01/2016   TOTAL HIP ARTHROPLASTY Right 07/01/2016   Procedure: RIGHT TOTAL HIP ARTHROPLASTY ANTERIOR APPROACH;  Surgeon: Mcarthur Rossetti, MD;  Location: [facility];  Service: Orthopedics;  Laterality: Right;    Prior to Admission medications   Medication Sig Start Date End Date Taking? Authorizing Provider  acetaminophen (TYLENOL) 500 MG tablet Take 1,000 mg by mouth every 4 (four) hours as needed for mild pain or fever.    Yes [provider]  albuterol (PROVENTIL) (2.5 MG/3ML) 0.083% nebulizer solution Take 3 mLs (2.5 mg total) by nebulization every 6 (six) hours as needed for wheezing or shortness of breath. 05/09/21  Yes Noreene Larsson, NP  albuterol (VENTOLIN HFA) 108 (90 Base) MCG/ACT inhaler INHALE 2 PUFFS INTO THE LUNGS EVERY 6 HOURS AS NEEDEDFOR SHORTNESS OF BREATH OR WHEEZING. 08/08/21  Yes Ambs, Kathrine Cords, FNP  amLODipine (NORVASC) 10 MG tablet TAKE 1 TABLET BY MOUTH ONCE A DAY. 05/27/21  Yes Noreene Larsson, NP  ascorbic acid (VITAMIN C) 500 MG tablet Take 1 tablet (500 mg total) by mouth daily. 10/12/19  Yes Barton Dubois, MD  aspirin 81 MG EC tablet Take 1 tablet (81 mg total) by mouth daily. Patient taking differently: Take 81 mg by mouth in the morning and at bedtime. 04/29/17  Yes Reino Bellis B, NP  atorvastatin (LIPITOR) 40 MG tablet TAKE 1 TABLET BY MOUTH ONCE A DAY. 07/29/21  Yes Lindell Spar, MD  Azelastine HCl 137 MCG/SPRAY SOLN USE 2 SPRAYS IN EACH NOSTRIL TWICE DAILY.  04/22/21  Yes Valentina Shaggy, MD  Cholecalciferol (VITAMIN D3) 125 MCG (5000 UT) CAPS Take 1 capsule (5,000 Units total) by mouth daily. 07/19/19  Yes Nida, Marella Chimes, MD  citalopram (CELEXA) 20 MG tablet Take 20 mg by mouth daily. 11/06/17  Yes [provider]  cycloSPORINE (RESTASIS) 0.05 % ophthalmic emulsion Place 1 drop into both eyes 2 (two) times daily.   Yes [provider]  dapagliflozin propanediol (FARXIGA) 10 MG TABS tablet Take 1 tablet (10 mg total) by mouth daily before breakfast. 06/12/21  Yes Patel, Colin Broach, MD  Dulaglutide (TRULICITY) 1.5 PX/1.0GY SOPN Inject 1.5 mg into the skin once a week. 07/11/21  Yes Cassandria Anger, MD  ferrous sulfate 325 (65 FE) MG tablet Take 1 tablet (325 mg total) by mouth daily  with breakfast. 04/02/21  Yes Noreene Larsson, NP  Fluticasone-Umeclidin-Vilant (TRELEGY ELLIPTA) 100-62.5-25 MCG/ACT AEPB Inhale 1 puff into the lungs daily. 07/24/21  Yes Lindell Spar, MD  furosemide (LASIX) 40 MG tablet Take 1 tablet (40 mg total) by mouth daily as needed for fluid. 07/20/20  Yes Evans Lance, MD  glipiZIDE (GLUCOTROL XL) 5 MG 24 hr tablet TAKE 1 TABLET BY MOUTH DAILY WITH BREAKFAST. 07/29/21  Yes Nida, Marella Chimes, MD  glucose blood (ACCU-CHEK AVIVA PLUS) test strip Use as instructed to check blood glucose three times daily. 07/22/21  Yes Nida, Marella Chimes, MD  magnesium gluconate (MAGONATE) 500 MG tablet Take 500 mg by mouth 2 (two) times daily.   Yes [provider]  metFORMIN (GLUCOPHAGE) 500 MG tablet Take 1 tablet (500 mg total) by mouth 2 (two) times daily with a meal. 07/05/21  Yes Nida, Marella Chimes, MD  mometasone (ELOCON) 0.1 % cream Apply 1 application topically 2 (two) times a week.  10/21/18  Yes [provider]  nitroGLYCERIN (NITROSTAT) 0.4 MG SL tablet Place 1 tablet (0.4 mg total) under the tongue every 5 (five) minutes as needed for chest pain. 07/20/20  Yes Evans Lance, MD   olopatadine (PATANOL) 0.1 % ophthalmic solution 1 drop 2 (two) times daily. 10/23/20  Yes [provider]  pantoprazole (PROTONIX) 40 MG tablet TAKE (1) TABLET BY MOUTH TWICE DAILY BEFORE A MEAL. 07/01/21  Yes Erenest Rasher, PA-C  prednisoLONE acetate (PRED FORTE) 1 % ophthalmic suspension Place 4 drops into both eyes 2 (two) times a week.  09/17/19  Yes [provider]  Tiotropium Bromide Monohydrate (SPIRIVA RESPIMAT) 2.5 MCG/ACT AERS Inhale 1 puff into the lungs daily. 11/09/20  Yes Ambs, Kathrine Cords, FNP  triamcinolone ointment (KENALOG) 0.1 % Apply 1 application topically 2 (two) times daily. 01/11/21  Yes Valentina Shaggy, MD    Current Facility-Administered Medications  Medication Dose Route Frequency Provider Last Rate Last Admin   albuterol (PROVENTIL) (2.5 MG/3ML) 0.083% nebulizer solution 2.5 mg  2.5 mg Nebulization Q6H PRN Adefeso, Oladapo, DO       aspirin EC tablet 81 mg  81 mg Oral Daily Adefeso, Oladapo, DO       atorvastatin (LIPITOR) tablet 40 mg  40 mg Oral Daily Adefeso, Oladapo, DO       feeding supplement (GLUCERNA SHAKE) (GLUCERNA SHAKE) liquid 237 mL  237 mL Oral TID BM Adefeso, Oladapo, DO       Fluticasone-Umeclidin-Vilant 100-62.5-25 MCG/ACT AEPB 1 puff  1 puff Inhalation Daily Adefeso, Oladapo, DO       insulin aspart (novoLOG) injection 0-15 Units  0-15 Units Subcutaneous Q4H Adefeso, Oladapo, DO       lactated ringers infusion   Intravenous Continuous Manuella Ghazi, Pratik D, DO   Held at 09/03/21 0020   lactulose (CHRONULAC) 10 GM/15ML solution 20 g  20 g Oral TID Adefeso, Oladapo, DO       magnesium sulfate IVPB 2 g 50 mL  2 g Intravenous Once Heath Lark D, DO 50 mL/hr at 09/03/21 0734 2 g at 09/03/21 0734   octreotide (SANDOSTATIN) 500 mcg in sodium chloride 0.9 % 250 mL (2 mcg/mL) infusion  50 mcg/hr Intravenous Continuous Adefeso, Oladapo, DO 25 mL/hr at 09/03/21 0047 50 mcg/hr at 09/03/21 0047   oseltamivir (TAMIFLU) capsule 75 mg  75 mg Oral Once  Adefeso, Oladapo, DO       oseltamivir (TAMIFLU) capsule 75 mg  75 mg Oral BID Adefeso,  Oladapo, DO       [START ON 09/06/2021] pantoprazole (PROTONIX) injection 40 mg  40 mg Intravenous Q12H Adefeso, Oladapo, DO       pantoprozole (PROTONIX) 80 mg /NS 100 mL infusion  8 mg/hr Intravenous Continuous Adefeso, Oladapo, DO 10 mL/hr at 09/03/21 0124 8 mg/hr at 09/03/21 0124   Current Outpatient Medications  Medication Sig Dispense Refill   acetaminophen (TYLENOL) 500 MG tablet Take 1,000 mg by mouth every 4 (four) hours as needed for mild pain or fever.      albuterol (PROVENTIL) (2.5 MG/3ML) 0.083% nebulizer solution Take 3 mLs (2.5 mg total) by nebulization every 6 (six) hours as needed for wheezing or shortness of breath. 150 mL 1   albuterol (VENTOLIN HFA) 108 (90 Base) MCG/ACT inhaler INHALE 2 PUFFS INTO THE LUNGS EVERY 6 HOURS AS NEEDEDFOR SHORTNESS OF BREATH OR WHEEZING. 18 g 1   amLODipine (NORVASC) 10 MG tablet TAKE 1 TABLET BY MOUTH ONCE A DAY. 90 tablet 0   ascorbic acid (VITAMIN C) 500 MG tablet Take 1 tablet (500 mg total) by mouth daily. 30 tablet 1   aspirin 81 MG EC tablet Take 1 tablet (81 mg total) by mouth daily. (Patient taking differently: Take 81 mg by mouth in the morning and at bedtime.) 30 tablet    atorvastatin (LIPITOR) 40 MG tablet TAKE 1 TABLET BY MOUTH ONCE A DAY. 30 tablet 0   Azelastine HCl 137 MCG/SPRAY SOLN USE 2 SPRAYS IN EACH NOSTRIL TWICE DAILY. 30 mL 0   Cholecalciferol (VITAMIN D3) 125 MCG (5000 UT) CAPS Take 1 capsule (5,000 Units total) by mouth daily. 90 capsule 0   citalopram (CELEXA) 20 MG tablet Take 20 mg by mouth daily.     cycloSPORINE (RESTASIS) 0.05 % ophthalmic emulsion Place 1 drop into both eyes 2 (two) times daily.     dapagliflozin propanediol (FARXIGA) 10 MG TABS tablet Take 1 tablet (10 mg total) by mouth daily before breakfast. 30 tablet 2   Dulaglutide (TRULICITY) 1.5 MQ/2.8MN SOPN Inject 1.5 mg into the skin once a week. 2 mL 2   ferrous  sulfate 325 (65 FE) MG tablet Take 1 tablet (325 mg total) by mouth daily with breakfast. 90 tablet 1   Fluticasone-Umeclidin-Vilant (TRELEGY ELLIPTA) 100-62.5-25 MCG/ACT AEPB Inhale 1 puff into the lungs daily. 1 each 0   furosemide (LASIX) 40 MG tablet Take 1 tablet (40 mg total) by mouth daily as needed for fluid. 30 tablet 11   glipiZIDE (GLUCOTROL XL) 5 MG 24 hr tablet TAKE 1 TABLET BY MOUTH DAILY WITH BREAKFAST. 90 tablet 0   glucose blood (ACCU-CHEK AVIVA PLUS) test strip Use as instructed to check blood glucose three times daily. 100 each 2   magnesium gluconate (MAGONATE) 500 MG tablet Take 500 mg by mouth 2 (two) times daily.     metFORMIN (GLUCOPHAGE) 500 MG tablet Take 1 tablet (500 mg total) by mouth 2 (two) times daily with a meal. 60 tablet 2   mometasone (ELOCON) 0.1 % cream Apply 1 application topically 2 (two) times a week.      nitroGLYCERIN (NITROSTAT) 0.4 MG SL tablet Place 1 tablet (0.4 mg total) under the tongue every 5 (five) minutes as needed for chest pain. 25 tablet 3   olopatadine (PATANOL) 0.1 % ophthalmic solution 1 drop 2 (two) times daily.     pantoprazole (PROTONIX) 40 MG tablet TAKE (1) TABLET BY MOUTH TWICE DAILY BEFORE A MEAL. 60 tablet 5   prednisoLONE acetate (  PRED FORTE) 1 % ophthalmic suspension Place 4 drops into both eyes 2 (two) times a week.      Tiotropium Bromide Monohydrate (SPIRIVA RESPIMAT) 2.5 MCG/ACT AERS Inhale 1 puff into the lungs daily. 4 g 5   triamcinolone ointment (KENALOG) 0.1 % Apply 1 application topically 2 (two) times daily. 454 g 1    Allergies as of 09/02/2021 - Review Complete 09/02/2021  Allergen Reaction Noted   Carvedilol Other (See Comments) 02/08/2015   Lisinopril Anaphylaxis, Shortness Of Breath, and Swelling 05/30/2015   Chantix [varenicline] Other (See Comments) 07/16/2021   Amoxicillin Nausea And Vomiting and Nausea Only 02/06/2015    Family History  Problem Relation Age of Onset   Heart attack Father    Stroke  Father    Arthritis Father    Heart disease Father    Cancer Mother        ???   Arthritis Mother    Heart disease Brother 7       died in sleep   Early death Brother    Diabetes Maternal Uncle    Alzheimer's disease Maternal Grandmother     Social History   Socioeconomic History   Marital status: Divorced    Spouse name: alice   Number of children: 3   Years of education: 12   Highest education level: Not on file  Occupational History   Occupation: SSI  Tobacco Use   Smoking status: Every Day    Packs/day: 0.50    Years: 46.00    Pack years: 23.00    Types: Cigarettes   Smokeless tobacco: Former    Types: Nurse, children's Use: Former  Substance and Sexual Activity   Alcohol use: Yes    Comment: couple beers occ   Drug use: No    Comment: history of drug use- marijuana, cocaine- quit 2014   Sexual activity: Yes    Partners: Female    Birth control/protection: None  Other Topics Concern   Not on file  Social History Narrative   Lives with girlfriend [name]   3 children, but 1 OD  In 2019.   Grandchildren-2      Two cats: may and princess; and a dog-foxy      Enjoy: spending time with pets, TV, yard work       Diet: eats all food groups   Caffeine: half and half coffee, some tea-half and half decaf   Water: 6-8 cups daily      Wears seat belt   Does not use phone while driving   Oceanographer at home      Social Determinants of Health   Financial Resource Strain: Low Risk    Difficulty of Paying Living Expenses: Not hard at all  Food Insecurity: No Food Insecurity   Worried About Charity fundraiser in the Last Year: Never true   Arboriculturist in the Last Year: Never true  Transportation Needs: No Transportation Needs   Lack of Transportation (Medical): No   Lack of Transportation (Non-Medical): No  Physical Activity: Insufficiently Active   Days of Exercise per Week: 3 days   Minutes of Exercise per Session: 20 min  Stress: No  Stress Concern Present   Feeling of Stress : Not at all  Social Connections: Moderately Integrated   Frequency of Communication with Friends and Family: More than three times a week   Frequency of Social Gatherings with Friends and Family: More than three times  a week   Attends Religious Services: More than 4 times per year   Active Member of Clubs or Organizations: Yes   Attends Music therapist: More than 4 times per year   Marital Status: Divorced  Human resources officer Violence: Not At Risk   Fear of Current or Ex-Partner: No   Emotionally Abused: No   Physically Abused: No   Sexually Abused: No    Review of Systems: Gen: Denies fever, chills, unintentional weight loss.  CV: Denies chest pain, heart palpitations. Resp: Mild SOB and intermittent cough with hemoptysis.  GI: See HPI GU : Denies urinary burning, urinary frequency, urinary incontinence.  MS: Denies joint pain Derm: Denies rash Psych: Denies disorientation/confusion.  Heme: See HPI  Physical Exam: Vital signs in last 24 hours: Temp:  [98.1 F (36.7 C)-98.8 F (37.1 C)] 98.4 F (36.9 C) (01/03 0101) Pulse Rate:  [85-104] 98 (01/03 0650) Resp:  [10-36] 34 (01/03 0650) BP: (80-122)/(39-84) 95/84 (01/03 0650) SpO2:  [83 %-100 %] 94 % (01/03 0650) Weight:  [94.8 kg] 94.8 kg (01/02 1940)   General:   Alert,  Well-developed, well-nourished, pleasant and cooperative in NAD. Nasal canula in place.  Head:  Normocephalic and atraumatic. Eyes:  Sclera clear, no icterus.   Conjunctiva pink. Ears:  Normal auditory acuity. Lungs:  Clear throughout to auscultation.   No wheezes, crackles, or rhonchi. No acute distress. Heart:  Regular rate and rhythm; no murmurs, clicks, rubs,  or gallops. Abdomen:  Protuberant, soft, very mild generalized TTP. No masses, hepatosplenomegaly or hernias noted. Normal bowel sounds, without guarding, and without rebound.   Rectal:  Deferred  Msk:  Symmetrical without gross  deformities. Normal posture. Extremities:  Without edema. Neurologic:  Alert and  oriented x4;  grossly normal neurologically.  Skin:  Intact without significant lesions or rashes. Psych:  Normal mood and affect.   Intake/Output from previous day: 01/02 0701 - 01/03 0700 In: 3449.7 [IV Piggyback:3449.7] Out: 700 [Stool:700] Intake/Output this shift: No intake/output data recorded.  Lab Results: Recent Labs    09/02/21 2015 09/02/21 2353 09/03/21 0427  WBC 10.2  --  8.7  HGB 12.9* 9.5* 11.3*  HCT 40.6 29.5* 34.6*  PLT 200  --  168   BMET Recent Labs    09/02/21 2015 09/03/21 0427  NA 135 139  K 5.5* 5.1  CL 99 108  CO2 26 24  GLUCOSE 315* 277*  BUN 52* 52*  CREATININE 1.17 0.90  CALCIUM 8.6* 7.9*   LFT Recent Labs    09/02/21 2015 09/03/21 0427  PROT 6.6 5.5*  ALBUMIN 3.4* 2.9*  AST 36 28  ALT 39 31  ALKPHOS 103 80  BILITOT 1.3* 1.3*   PT/INR Recent Labs    09/02/21 2015  LABPROT 15.3*  INR 1.2    Studies/Results: DG Chest 2 View  Result Date: 09/02/2021 CLINICAL DATA:  Shortness of breath and chest pain.  Cough.  Fever. EXAM: CHEST - 2 VIEW COMPARISON:  05/13/2021 trauma CT 01/25/2021 FINDINGS: Prominent heart size may be accentuated by portable technique. Right-sided aortic arch. Implanted loop recorder in the left chest wall. Mild diffuse peribronchial thickening. Subsegmental opacities in the bases. No large pleural effusion. No pneumothorax. Thoracic spondylosis. IMPRESSION: 1. Cardiomegaly. Peribronchial thickening may be bronchitis or congestive. 2. Subsegmental opacities at the bases favor atelectasis or scarring. Electronically Signed   By: Keith Rake M.D.   On: 09/02/2021 20:39    Impression: 56 y.o. year old male with medical history significant  for essential hypertension, T2DM, CAD, STEMI (2016), NSTEMI (2018), CHF, hyperlipidemia, COPD, cirrhosis due to fatty liver and EtOH, Barrett's esophagus, adenomatous colon polyps who presented to  the emergency department via EMS due to 2-day onset of chest soreness which occurred primarily when coughing, fever, disorientation/lightheadedness, and hematochezia, found to have hypotension, hypoxia, lactic acidosis, normocytic anemia, hyperkalemia, elevated ammonia, and influenza A. GI consulted due to GI bleed.   GI bleed:  New onset gross hematochezia starting 09/01/2021 without constipation, diarrhea, or abdominal pain initially. On 1/2, he developed mild abdominal discomfort which he relates to coughing.  There was some question of hematemesis, but after discussion with patient today, he denies any vomiting but rather reports coughing up blood-tinged phlegm.  Hemoglobin was 12.9 on admission, down from 15.0 in August.  BUN elevated at 52 with normal creatinine. He subsequently had a couple of large loose bloody bowel movements in the emergency department with hemoglobin down to 9.5. This was associated with hypotension.  He received 2 units PRBCs emergently with hemoglobin improved to 11.3 this morning.  He has had 2 additional large bloody bowel movements this morning after blood was drawn this morning, last just prior to 7 AM.  Reports his mild abdominal pain has improved. Exam with minimal generalized TTP. Currently, his blood pressure is stable and he denies lightheadedness or presyncope.  He is on PPI and octreotide infusion. Last EGD March 2021 with Barrett's esophagus, ?  Esophageal varices, portal gastropathy, ?  Gastric varices.  Last colonoscopy March 2018 with diverticulosis, 4 tubular adenomas, 1 hyperplastic polyp.  He is not on any blood thinners but does take 81 mg aspirin twice daily.  Denies other NSAID use.  Last drink alcohol about 3 weeks ago.  Concern for upper GI bleed in the setting of elevated BUN, possibly secondary to esophageal varices, gastric varices, portal hypertensive gastropathy, dieulafoy lesion, gastritis, duodenitis, PUD in the setting of prior alcohol use and  cirrhosis, AMVs.  Cannot rule out lower GI bleed from diverticulosis, colon polyps, malignancy, or small bowel/colonic AMVs. Abdominal pain is somewhat non-specific, but can't rule out colitis/ischemic colitis.   We will plan for EGD today. Consider CT A/P with contrast after EGD and colonoscopy possibly tomorrow if EGD is unrevealing.  We will continue PPI and octreotide drip for now.  He will also need to be started on Rocephin for SBP prophylaxis.   Diarrhea:  New onset likely secondary to GI bleed and now starting on lactulose due to elevated ammonia.   Cirrhosis:  Secondary to fatty liver and EtOH, now abstinent from alcohol x3 weeks.  MELD 10 and previously fairly well compensated.  Patient reported some disorientation prior to admission and was found to have elevated ammonia at 75. Disorientation has resolved, no signs/symptoms of HE today. Lactulose has been started. Elevated ammonia likely secondary to GI bleed. He does have a protuberant abdomen that is soft on exam and patient reports no increasing abdominal distension recently. No peripheral edema. Abdominal US up to date in August.   Influenza A: Associated SOB and cough requiring 3.5L O2 via nasal canula. Started on Tamiflu.   Hyperkalemia:  Resolved  Lactic acidosis:  Likely secondary to influenza A with hypoxia and upper GI bleed, now resolved with IV hydration.  Plan: STAT H/H. INR NPO EGD with propofol with Dr. Jenetta Downer needed today. The risks, benefits, and alternatives have been discussed with the patient in detail. The patient states understanding and desires to proceed.  Consider CT A/P with  contrast after EGD today. May need a colonoscopy pending EGD and CT findings. Continue PPI and octreotide drip. Start Rocephin 1 g daily for SBP prophylaxis. Agree with lactulose, goal of 3 BMs daily.  Monitor for signs/symptoms of HE.  CBC, CMP, INR daily Management of Influenza per hospitalist.  Alcohol cessation.    Addendum:  Patient was briefly evaluated by Dr. Jenetta Downer prior to planned EGD. His O2 requirements had increased to 4L and he was stating 91%. Planned to hold off on procedures today to allow respiratory status to improve. Encouragingly repeat H/H this morning was stable at 11.5 despite the 2 large bloody BMs he had. I went to see the patient this afternoon around 2pm. Nursing staff reported he had a large red bloody BM about 1 hour ago. He remains hemodynamically stable. Stating 96% on 4L [place]. Rectal exam with red/maroon colored blood on gloved exam finger. No stool in rectal vault. No melena. No appreciable hemorrhoids or rectal masses. Will proceed with CT angio GI bleed scan. Will also place orders for H/H every 8 hours. Will allow clear liquids this afternoon and make NPO at midnight for possible EGD tomorrow.   LOS: 0 days    09/03/2021, 7:43 AM   Aliene Altes, PA-C [facility]

## 2021-09-03 NOTE — H&P (Addendum)
History and Physical  [name] EXH:[id number] DOB: 02/19/1966 DOA: 09/02/2021  Referring physician: Maudie Flakes, MD PCP: Lindell Spar, MD  Patient coming from: Home  Chief Complaint: Chest pain  HPI: Randy Hebert is a 56 y.o. male with medical history significant for essential hypertension, T2DM, CAD, STEMI (2016), NSTEMI (2018), CHF, hyperlipidemia, COPD, cirrhosis who presents to the emergency department via EMS due to 2 day onset of chest soreness which occurs primarily when he coughs.  He complained of fever of 100.83F and this was associated with nausea, vomiting and diarrhea which started about 2 hours PTA.  He endorsed vomiting of blood and blood in stool yesterday prior to arrival to the ED.  He denies abdominal pain and he was not on any blood thinner.  ED Course:  In the emergency department, patient was intermittently tachypneic, BP was soft at 89/54.  Work-up in the ED showed normocytic anemia, hyperkalemia, hyperglycemia, BUN 52, creatinine 1.17, albumin 3.4, FOBT was positive, lactic acid 3.0, ammonia level was 75. Influenza A was positive.  Influenza B and SARS coronavirus 2 was negative. Chest x-ray showed cardiomegaly.  Peribronchial thickening may be bronchitis or congestive.  Subsegmental opacities at the bases favor atelectasis or scarring. While in the ED, patient had 2 large bloody bowel movements.  Hemoglobin dropped about 3 units within few hours of arrival to the ED.  Type and screen was drawn and 2 units of PRBC was transfused.  IV hydration was provided, patient was empirically started on IV ceftriaxone and azithromycin, Protonix drip and octreotide drip were started.  Tamiflu was given.  Hospitalist was asked to admit patient for further evaluation and management  Review of Systems: A full 10 point Review of Systems was done, except as stated above, all other Review of systems were negative.  Past Medical History:  Diagnosis Date   ACE  inhibitor-aggravated angioedema    Allergy    Angio-edema    Anxiety    Arthritis    Asthma    hip replacement   Back pain    Bradycardia 04/28/2017   Bulging of cervical intervertebral disc    CAD (coronary artery disease)    lateral STEMI 02/06/2015 00% D1 occlusion treated with Promus Premier 2.5 mm x 16 mm DES, 70% ramus stenosis, 40% mid RCA stenosis, 45% distal RCA stenosis, EF 45-50%   CHF (congestive heart failure) (HCC)    COPD (chronic obstructive pulmonary disease) ([place])    Depression    Diabetes mellitus without complication ([place])    Difficult intubation    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   Dry eye    Dyspnea    Early satiety 09/23/2016   GERD (gastroesophageal reflux disease)    HCAP (healthcare-associated pneumonia) 05/15/2017   Headache    Heart murmur    Hip pain    Hyperlipidemia    Hypertension    Lobar pneumonia ([place]) 05/15/2017   Melena 08/04/2018   MI (myocardial infarction) [facility])    Myocardial infarction ([place])    Neck pain    Non-ST elevation (NSTEMI) myocardial infarction ([place]) 04/27/2017   NSTEMI (non-ST elevated myocardial infarction) ([place]) 04/26/2017   Otitis media    Pleurisy    Pneumonia due to COVID-19 virus 10/07/2019   Rectal bleeding 11/08/2018   Right shoulder pain 03/20/2020   Sinus pause    9 sec sinus pause on telemetry after started on coreg after MI, avoid AV nodal blocking agent   Sleep apnea  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   STEMI (ST elevation myocardial infarction) ([place]) 05/31/2015   Substance abuse ([place]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   Syncope 29/93/7169   Syncope and collapse 05/15/2017   Transaminitis 08/04/2018   Unilateral primary osteoarthritis, right hip 07/01/2016   Past Surgical History:  Procedure Laterality Date   BIOPSY  10/09/2016   Procedure: BIOPSY;  Surgeon: Daneil Dolin, MD;  [facility];  Service: Endoscopy;;   BIOPSY  11/28/2019   Procedure: BIOPSY;  Surgeon: Daneil Dolin,  MD;  [facility];  Service: Endoscopy;;   CARDIAC CATHETERIZATION N/A 02/06/2015   Procedure: Left Heart Cath and Coronary Angiography;  Surgeon: Leonie Man, MD;  Location: [place] [facility];  Service: Cardiovascular;  Laterality: N/A;   CARDIAC CATHETERIZATION N/A 02/06/2015   Procedure: Coronary Stent Intervention;  Surgeon: Leonie Man, MD;  Location: [place] [facility];  Service: Cardiovascular;  Laterality: N/A;   COLONOSCOPY WITH PROPOFOL N/A 10/09/2016   Sigmoid and descending colon diverticulosis, four 4-6 mm polyps in sigmoid, one 4 mm polyp in descending. Tubular adenomas and hyperplastic. 5 year surveillance.    COLONOSCOPY WITH PROPOFOL N/A 11/24/2016   Sigmoid and descending colon diverticulosis, four 4-6 mm polyps in sigmoid, one 4 mm polyp in descending. Tubular adenomas and hyperplastic. 5 year surveillance.    CORONARY ANGIOPLASTY WITH STENT PLACEMENT  01/2015   CORONARY STENT INTERVENTION N/A 04/27/2017   Procedure: CORONARY STENT INTERVENTION;  Surgeon: Nelva Bush, MD;  Location: [place] [facility];  Service: Cardiovascular;  Laterality: N/A;   ELECTROPHYSIOLOGY STUDY N/A 06/29/2017   Procedure: ELECTROPHYSIOLOGY STUDY;  Surgeon: Evans Lance, MD;  Location: [place] [facility];  Service: Cardiovascular;  Laterality: N/A;   ESOPHAGOGASTRODUODENOSCOPY (EGD) WITH PROPOFOL N/A 10/09/2016   Dr. Gala Romney: LA grade a esophagitis.  Barrett's esophagus, biopsy-proven.  Small hiatal hernia.  EGD February 2019   ESOPHAGOGASTRODUODENOSCOPY (EGD) WITH PROPOFOL N/A 11/28/2019    salmon-colored esophageal mucosa (Barrett's) small hiatal hernia, portal hypertensive gastropathy, normal duodenum, 3 year surveillance   INCISION / DRAINAGE HAND / FINGER     LEFT HEART CATH AND CORONARY ANGIOGRAPHY N/A 04/27/2017   Procedure: LEFT HEART CATH AND CORONARY ANGIOGRAPHY;  Surgeon: Nelva Bush, MD;  Location: [place] [facility];  Service: Cardiovascular;  Laterality: N/A;   LOOP  RECORDER INSERTION  06/29/2017   Procedure: Loop Recorder Insertion;  Surgeon: Evans Lance, MD;  Location: [place] [facility];  Service: Cardiovascular;;   POLYPECTOMY  11/24/2016   Procedure: POLYPECTOMY;  Surgeon: Daneil Dolin, MD;  [facility];  Service: Endoscopy;;  descending and sigmoid   TOTAL HIP ARTHROPLASTY Right 07/01/2016   TOTAL HIP ARTHROPLASTY Right 07/01/2016   Procedure: RIGHT TOTAL HIP ARTHROPLASTY ANTERIOR APPROACH;  Surgeon: Mcarthur Rossetti, MD;  Location: [place];  Service: Orthopedics;  Laterality: Right;    Social History:  reports that he has been smoking cigarettes. He has a 23.00 pack-year smoking history. He has quit using smokeless tobacco.  His smokeless tobacco use included chew. He reports current alcohol use. He reports that he does not use drugs.   Allergies  Allergen Reactions   Carvedilol Other (See Comments)    Sinus pause on telemetry >3 seconds. Longest one 9 sec. No AV nodal agent   Lisinopril Anaphylaxis, Shortness Of Breath and Swelling    Angioedema, required intubation and mechanical ventilation   Chantix [Varenicline] Other (See Comments)  Nightmares and unable to sleep well   Amoxicillin Nausea And Vomiting and Nausea Only    Nausea only - no allergy  Did it involve swelling of the face/tongue/throat, SOB, or low BP? No Did it involve sudden or severe rash/hives, skin peeling, or any reaction on the inside of your mouth or nose? No Did you need to seek medical attention at a hospital or doctor's office? No When did it last happen? childhood reaction      If all above answers are NO, may proceed with cephalosporin use.     Family History  Problem Relation Age of Onset   Heart attack Father    Stroke Father    Arthritis Father    Heart disease Father    Cancer Mother        ???   Arthritis Mother    Heart disease Brother 57       died in sleep   Early death Brother    Diabetes Maternal Uncle     Alzheimer's disease Maternal Grandmother      Prior to Admission medications   Medication Sig Start Date End Date Taking? Authorizing Provider  acetaminophen (TYLENOL) 500 MG tablet Take 1,000 mg by mouth every 4 (four) hours as needed for mild pain or fever.     [provider]  albuterol (PROVENTIL) (2.5 MG/3ML) 0.083% nebulizer solution Take 3 mLs (2.5 mg total) by nebulization every 6 (six) hours as needed for wheezing or shortness of breath. 05/09/21   Noreene Larsson, NP  albuterol (VENTOLIN HFA) 108 (90 Base) MCG/ACT inhaler INHALE 2 PUFFS INTO THE LUNGS EVERY 6 HOURS AS NEEDEDFOR SHORTNESS OF BREATH OR WHEEZING. 08/08/21   Ambs, Kathrine Cords, FNP  amLODipine (NORVASC) 10 MG tablet TAKE 1 TABLET BY MOUTH ONCE A DAY. 05/27/21   Noreene Larsson, NP  ascorbic acid (VITAMIN C) 500 MG tablet Take 1 tablet (500 mg total) by mouth daily. 10/12/19   Barton Dubois, MD  aspirin 81 MG EC tablet Take 1 tablet (81 mg total) by mouth daily. Patient taking differently: Take 81 mg by mouth in the morning and at bedtime. 04/29/17   Cheryln Manly, NP  atorvastatin (LIPITOR) 40 MG tablet TAKE 1 TABLET BY MOUTH ONCE A DAY. 07/29/21   Lindell Spar, MD  Azelastine HCl 137 MCG/SPRAY SOLN USE 2 SPRAYS IN EACH NOSTRIL TWICE DAILY. 04/22/21   Valentina Shaggy, MD  Cholecalciferol (VITAMIN D3) 125 MCG (5000 UT) CAPS Take 1 capsule (5,000 Units total) by mouth daily. 07/19/19   Cassandria Anger, MD  citalopram (CELEXA) 20 MG tablet Take 20 mg by mouth daily. 11/06/17   [provider]  cycloSPORINE (RESTASIS) 0.05 % ophthalmic emulsion Place 1 drop into both eyes 2 (two) times daily.    [provider]  dapagliflozin propanediol (FARXIGA) 10 MG TABS tablet Take 1 tablet (10 mg total) by mouth daily before breakfast. 06/12/21   Lindell Spar, MD  Dulaglutide (TRULICITY) 1.5 LK/4.4WN SOPN Inject 1.5 mg into the skin once a week. 07/11/21   Cassandria Anger, MD  ferrous sulfate 325 (65  FE) MG tablet Take 1 tablet (325 mg total) by mouth daily with breakfast. 04/02/21   Noreene Larsson, NP  Fluticasone-Umeclidin-Vilant (TRELEGY ELLIPTA) 100-62.5-25 MCG/ACT AEPB Inhale 1 puff into the lungs daily. 07/24/21   Lindell Spar, MD  furosemide (LASIX) 40 MG tablet Take 1 tablet (40 mg total) by mouth daily as needed for fluid. 07/20/20  Evans Lance, MD  glipiZIDE (GLUCOTROL XL) 5 MG 24 hr tablet TAKE 1 TABLET BY MOUTH DAILY WITH BREAKFAST. 07/29/21   Cassandria Anger, MD  glucose blood (ACCU-CHEK AVIVA PLUS) test strip Use as instructed to check blood glucose three times daily. 07/22/21   Cassandria Anger, MD  magnesium gluconate (MAGONATE) 500 MG tablet Take 500 mg by mouth 2 (two) times daily.    [provider]  metFORMIN (GLUCOPHAGE) 500 MG tablet Take 1 tablet (500 mg total) by mouth 2 (two) times daily with a meal. 07/05/21   Nida, Marella Chimes, MD  mometasone (ELOCON) 0.1 % cream Apply 1 application topically 2 (two) times a week.  10/21/18   [provider]  nitroGLYCERIN (NITROSTAT) 0.4 MG SL tablet Place 1 tablet (0.4 mg total) under the tongue every 5 (five) minutes as needed for chest pain. 07/20/20   Evans Lance, MD  olopatadine (PATANOL) 0.1 % ophthalmic solution 1 drop 2 (two) times daily. 10/23/20   [provider]  pantoprazole (PROTONIX) 40 MG tablet TAKE (1) TABLET BY MOUTH TWICE DAILY BEFORE A MEAL. 07/01/21   Erenest Rasher, PA-C  prednisoLONE acetate (PRED FORTE) 1 % ophthalmic suspension Place 4 drops into both eyes 2 (two) times a week.  09/17/19   [provider]  Tiotropium Bromide Monohydrate (SPIRIVA RESPIMAT) 2.5 MCG/ACT AERS Inhale 1 puff into the lungs daily. 11/09/20   Dara Hoyer, FNP  triamcinolone ointment (KENALOG) 0.1 % Apply 1 application topically 2 (two) times daily. 01/11/21   Valentina Shaggy, MD    Physical Exam: BP 104/64    Pulse 100    Temp 98.4 F (36.9 C)    Resp (!) 33    Ht 5\' 5"   (1.651 m)    Wt 94.8 kg    SpO2 90%    BMI 34.78 kg/m   General: 56 y.o. year-old male well developed well nourished in no acute distress.  Alert and oriented x3. HEENT: NCAT, EOMI Neck: Supple, trachea medial Cardiovascular: Regular rate and rhythm with no rubs or gallops.  No thyromegaly or JVD noted.  No lower extremity edema. 2/4 pulses in all 4 extremities. Respiratory: Clear to auscultation with no wheezes or rales. Good inspiratory effort. Abdomen: Soft, nontender, distended with normal bowel sounds x4 quadrants. Muskuloskeletal: No cyanosis, clubbing or edema noted bilaterally Neuro: CN II-XII intact, strength 5/5 x 4, sensation, reflexes intact Skin: No ulcerative lesions noted or rashes Psychiatry: Judgement and insight appear normal. Mood is appropriate for condition and setting          Labs on Admission:  Basic Metabolic Panel: Recent Labs  Lab 09/02/21 2015  NA 135  K 5.5*  CL 99  CO2 26  GLUCOSE 315*  BUN 52*  CREATININE 1.17  CALCIUM 8.6*   Liver Function Tests: Recent Labs  Lab 09/02/21 2015  AST 36  ALT 39  ALKPHOS 103  BILITOT 1.3*  PROT 6.6  ALBUMIN 3.4*   No results for input(s): LIPASE, AMYLASE in the last 168 hours. Recent Labs  Lab 09/03/21 0030  AMMONIA 75*   CBC: Recent Labs  Lab 09/02/21 2015 09/02/21 2353  WBC 10.2  --   HGB 12.9* 9.5*  HCT 40.6 29.5*  MCV 93.1  --   PLT 200  --    Cardiac Enzymes: No results for input(s): CKTOTAL, CKMB, CKMBINDEX, TROPONINI in the last 168 hours.  BNP (last 3 results) Recent Labs    09/02/21 2015  BNP 37.0    ProBNP (last 3 results) No results for input(s): PROBNP in the last 8760 hours.  CBG: No results for input(s): GLUCAP in the last 168 hours.  Radiological Exams on Admission: DG Chest 2 View  Result Date: 09/02/2021 CLINICAL DATA:  Shortness of breath and chest pain.  Cough.  Fever. EXAM: CHEST - 2 VIEW COMPARISON:  05/13/2021 trauma CT 01/25/2021 FINDINGS: Prominent heart  size may be accentuated by portable technique. Right-sided aortic arch. Implanted loop recorder in the left chest wall. Mild diffuse peribronchial thickening. Subsegmental opacities in the bases. No large pleural effusion. No pneumothorax. Thoracic spondylosis. IMPRESSION: 1. Cardiomegaly. Peribronchial thickening may be bronchitis or congestive. 2. Subsegmental opacities at the bases favor atelectasis or scarring. Electronically Signed   By: Keith Rake M.D.   On: 09/02/2021 20:39    EKG: I independently viewed the EKG done and my findings are as followed: Sinus rhythm at a rate of 98 bpm  Assessment/Plan Present on Admission:  GI bleed  Cirrhosis of liver (HCC)  Mixed hyperlipidemia  Chronic systolic CHF (congestive heart failure) (HCC)  Principal Problem:   GI bleed Active Problems:   Essential hypertension   Chronic systolic CHF (congestive heart failure) (HCC)   COPD (chronic obstructive pulmonary disease) (HCC)   Mixed hyperlipidemia   OSA (obstructive sleep apnea)   Hyperglycemia due to diabetes mellitus (HCC)   Acute respiratory failure with hypoxia (HCC)   Diarrhea   Cirrhosis of liver (HCC)   Influenza A   Hypoalbuminemia due to protein-calorie malnutrition (HCC)   Lactic acidosis   Elevated troponin   Obesity (BMI 30.0-34.9)   Hyperkalemia   Hyperammonemia (HCC)  Acute GI bleed H/H= 9.5/29.5, this was 15.0/44.4 on 04/11/2021 Hemoglobin dropped 3 units within the time he was in the ED Hemoccult was positive Type and crossmatch was done 2 units of PRBC was transfused in the ED Continue IV Protonix drip Continue IV octreotide drip (due to upper GI bleed) Gastroenterologist  will be consulted in the morning  Acute respiratory failure with hypoxia possibly due to influenza A rule out CAP POA Continue Tamiflu Patient was started on ceftriaxone and azithromycin, we shall continue same at this time with plan to de-escalate/discontinue based on blood culture, sputum  culture, urine Legionella, strep pneumo and procalcitonin Continue Tylenol as needed Continue Mucinex, incentive spirometry, flutter valve   Lactic acidosis possibly secondary to above 2 Lactic acid 3.2  > 3.0 Continue IV hydration and continue to trend lactic acid  Elevated troponin possibly secondary to type II demand ischemia Troponin x2 -26 . 20  Hyperkalemia K+ 5.5, no EKG changes, IV hydration was provided, recheck BMP in the morning  Hyperammonia due to cirrhosis of liver Ammonia 75, continue lactulose  Hypoalbuminemia secondary to mild protein calorie malnutrition Protein supplement to be provided  Diarrhea Patient had 2 large bloody bowel movements in the ED No further watery stool noted in the ED Consider C. difficile and GI stool panel for further diarrhea  Hyperglycemia secondary to T2DM Continue ISS and hypoglycemia protocol Glipizide and metformin will be held at this time  CAD/chronic systolic CHF Continue aspirin, Lipitor Lasix held at this time due to soft BP  COPD Continue Proventil, Ventolin, Trelegy Ellipta  Mixed hyperlipidemia Continue Lipitor  Essential hypertension BP meds will be held at this time due to soft BP  OSA-continue CPAP  Obesity (BMI 34.78 kg/m) Patient was counseled on diet and lifestyle modification  DVT prophylaxis: SCDs  Code Status: Full  code  Family Communication: None at bedside  Disposition Plan:  Patient is from:                        home Anticipated DC to:                   SNF or family members home Anticipated DC date:               2-3 days Anticipated DC barriers:          Patient requires inpatient management due to GI bleed and pending gastroenterology consult   Consults called: Gastroenterology  Admission status: Inpatient    Bernadette Hoit MD [facility]  09/03/2021, 4:23 AM

## 2021-09-03 NOTE — ED Notes (Signed)
Pt getting emergent blood at this time. Pt states that he feels normal and vital signs are WNL. Will continue to monitor for reaction.

## 2021-09-03 NOTE — ED Notes (Signed)
Pt cleaned and changed after having another large bloody stool. Hospitalist made aware. Pressure is stable. Pt states that he feels normal. Will continue to monitor.

## 2021-09-03 NOTE — Progress Notes (Signed)
Per HPI: Randy Hebert is a 56 y.o. male with medical history significant for essential hypertension, T2DM, CAD, STEMI (2016), NSTEMI (2018), CHF, hyperlipidemia, COPD, cirrhosis who presents to the emergency department via EMS due to 2 day onset of chest soreness which occurs primarily when he coughs.  He complained of fever of 100.24F and this was associated with nausea, vomiting and diarrhea which started about 2 hours PTA.  He endorsed vomiting of blood and blood in stool yesterday prior to arrival to the ED.  He denies abdominal pain and he was not on any blood thinner.  -Patient has been admitted with acute GI bleed and has undergone 2 unit PRBC transfusion and remains on Protonix and octreotide drip.  GI planning for urgent endoscopy today.  He is also been started on Rocephin for SBP prophylaxis.  Additionally, he is noted to have acute hypoxemic respiratory failure due to influenza A and has been started on Tamiflu.  He continues to have ongoing bloody bowel movements and is currently n.p.o. and on IV fluid.  Further evaluation and management per GI recommendations appreciated.  Patient has been admitted after midnight and has been seen and evaluated at bedside.  Total care time: 30 minutes.

## 2021-09-04 ENCOUNTER — Encounter (HOSPITAL_COMMUNITY): Payer: Self-pay | Admitting: Internal Medicine

## 2021-09-04 ENCOUNTER — Inpatient Hospital Stay (HOSPITAL_COMMUNITY): Payer: Medicare Other

## 2021-09-04 DIAGNOSIS — E7220 Disorder of urea cycle metabolism, unspecified: Secondary | ICD-10-CM

## 2021-09-04 DIAGNOSIS — K922 Gastrointestinal hemorrhage, unspecified: Secondary | ICD-10-CM

## 2021-09-04 DIAGNOSIS — K7030 Alcoholic cirrhosis of liver without ascites: Principal | ICD-10-CM

## 2021-09-04 DIAGNOSIS — R197 Diarrhea, unspecified: Secondary | ICD-10-CM

## 2021-09-04 LAB — URINALYSIS, ROUTINE W REFLEX MICROSCOPIC
Bacteria, UA: NONE SEEN
Bilirubin Urine: NEGATIVE
Glucose, UA: NEGATIVE mg/dL
Hgb urine dipstick: NEGATIVE
Ketones, ur: NEGATIVE mg/dL
Nitrite: NEGATIVE
Protein, ur: NEGATIVE mg/dL
Specific Gravity, Urine: 1.017 (ref 1.005–1.030)
pH: 5 (ref 5.0–8.0)

## 2021-09-04 LAB — GLUCOSE, CAPILLARY
Glucose-Capillary: 183 mg/dL — ABNORMAL HIGH (ref 70–99)
Glucose-Capillary: 208 mg/dL — ABNORMAL HIGH (ref 70–99)
Glucose-Capillary: 147 mg/dL — ABNORMAL HIGH (ref 70–99)
Glucose-Capillary: 162 mg/dL — ABNORMAL HIGH (ref 70–99)
Glucose-Capillary: 160 mg/dL — ABNORMAL HIGH (ref 70–99)

## 2021-09-04 LAB — COMPREHENSIVE METABOLIC PANEL
ALT: 29 U/L (ref 0–44)
AST: 34 U/L (ref 15–41)
Albumin: 2.8 g/dL — ABNORMAL LOW (ref 3.5–5.0)
Alkaline Phosphatase: 73 U/L (ref 38–126)
Anion gap: 3 — ABNORMAL LOW (ref 5–15)
BUN: 18 mg/dL (ref 6–20)
CO2: 30 mmol/L (ref 22–32)
Calcium: 7.5 mg/dL — ABNORMAL LOW (ref 8.9–10.3)
Chloride: 104 mmol/L (ref 98–111)
Creatinine, Ser: 0.68 mg/dL (ref 0.61–1.24)
GFR, Estimated: >60 mL/min (ref >60)
Glucose, Bld: 201 mg/dL — ABNORMAL HIGH (ref 70–99)
Potassium: 3.9 mmol/L (ref 3.5–5.1)
Sodium: 137 mmol/L (ref 135–145)
Total Bilirubin: 0.4 mg/dL (ref 0.3–1.2)
Total Protein: 5.2 g/dL — ABNORMAL LOW (ref 6.5–8.1)

## 2021-09-04 LAB — CBC
HCT: 27.4 % — ABNORMAL LOW (ref 39.0–52.0)
Hemoglobin: 8.8 g/dL — ABNORMAL LOW (ref 13.0–17.0)
MCH: 31 pg (ref 26.0–34.0)
MCHC: 32.1 g/dL (ref 30.0–36.0)
MCV: 96.5 fL (ref 80.0–100.0)
Platelets: 131 10*3/uL — ABNORMAL LOW (ref 150–400)
RBC: 2.84 MIL/uL — ABNORMAL LOW (ref 4.22–5.81)
RDW: 14.6 % (ref 11.5–15.5)
WBC: 8.3 10*3/uL (ref 4.0–10.5)
nRBC: 0 % (ref 0.0–0.2)

## 2021-09-04 LAB — MAGNESIUM: Magnesium: 1.7 mg/dL (ref 1.7–2.4)

## 2021-09-04 LAB — HEMOGLOBIN AND HEMATOCRIT, BLOOD
HCT: 29.3 % — ABNORMAL LOW (ref 39.0–52.0)
HCT: 26.9 % — ABNORMAL LOW (ref 39.0–52.0)
HCT: 29.8 % — ABNORMAL LOW (ref 39.0–52.0)
Hemoglobin: 9.6 g/dL — ABNORMAL LOW (ref 13.0–17.0)
Hemoglobin: 8.7 g/dL — ABNORMAL LOW (ref 13.0–17.0)
Hemoglobin: 9.4 g/dL — ABNORMAL LOW (ref 13.0–17.0)

## 2021-09-04 LAB — PROTIME-INR
INR: 1 (ref 0.8–1.2)
Prothrombin Time: 13.2 seconds (ref 11.4–15.2)

## 2021-09-04 MED ORDER — ACETAMINOPHEN 325 MG PO TABS
650.0000 mg | ORAL_TABLET | Freq: Four times a day (QID) | ORAL | Status: DC | PRN
  Administered 2021-09-04: 650 mg via ORAL
  Filled 2021-09-04: qty 2

## 2021-09-04 MED ORDER — SODIUM CHLORIDE 0.9 % IV SOLN
2.0000 g | INTRAVENOUS | Status: DC
  Administered 2021-09-04: 2 g via INTRAVENOUS
  Filled 2021-09-04: qty 20

## 2021-09-04 MED ORDER — FUROSEMIDE 10 MG/ML IJ SOLN
40.0000 mg | Freq: Once | INTRAMUSCULAR | Status: AC
  Administered 2021-09-04: 40 mg via INTRAVENOUS
  Filled 2021-09-04: qty 4

## 2021-09-04 NOTE — Progress Notes (Signed)
Patient's O2 sat keeps declining.  RN called to let me know about this.  On previous CPAP settings, pressure kept climbing to keep airway open.  Sats were dropping however.  Tried Bipap mode on dreamstation with patient.  Placed patient on 14/6 with 6L.  Currently patient sat is 92%.  Will try for a while to see if this helps patient.  May have to try V60 on patient to help with sat issue.

## 2021-09-04 NOTE — Progress Notes (Addendum)
Progress Note    Randy Hebert  OEU:[id number] DOB: [birth year]  DOA: 09/02/2021 PCP: Lindell Spar, MD      Brief Narrative:    Medical records reviewed and are as summarized below:   Randy Hebert is a 56 y.o. male with medical history significant for essential hypertension, T2DM, CAD, STEMI (2016), NSTEMI (2018), CHF, hyperlipidemia, COPD, liver cirrhosis, who presented to the hospital with pleuritic chest pain, cough, fever, nausea, vomiting, diarrhea, hematemesis and bloody stools.      Assessment/Plan:   Principal Problem:   GI bleed Active Problems:   Essential hypertension   Chronic systolic CHF (congestive heart failure) (HCC)   COPD (chronic obstructive pulmonary disease) (HCC)   Mixed hyperlipidemia   OSA (obstructive sleep apnea)   Hyperglycemia due to diabetes mellitus (HCC)   Acute respiratory failure with hypoxia (HCC)   Diarrhea   Cirrhosis of liver (HCC)   Influenza A   Hypoalbuminemia due to protein-calorie malnutrition (HCC)   Lactic acidosis   Elevated troponin   Obesity (BMI 30.0-34.9)   Hyperkalemia   Hyperammonemia (HCC)    Body mass index is 35.37 kg/m.  (Obesity)  Influenza A infection: Continue Tamiflu  Acute hypoxemic respiratory failure: He is on 5 L/min oxygen via nasal cannula.  Taper off oxygen as able.  He said he does not use home oxygen.  Acute GI bleeding, positive heme stools: Continue IV Protonix drip and IV octreotide drip.  Plan for endoscopic work-up when respiratory status is stable.  Follow-up with gastroenterologist.  Acute blood loss anemia: Monitor H&H and transfuse as needed.  Pulmonary vascular congestion on chest x-ray, probable fluid overload: Give IV Lasix 40 mg x 1 dose.  Hyperkalemia, hypomagnesemia: Improved  COPD: Continue bronchodilators  Liver cirrhosis, elevated ammonia level: Continue lactulose  OSA: He said he has not used CPAP for a long time because he was homeless at some point.   Follow-up with pulmonologist for sleep study/further evaluation  Other comorbidities include CAD, chronic systolic CHF, hypertension, type II DM   Diet Order             Diet NPO time specified Except for: Sips with Meds  Diet effective midnight                      Consultants: Gastroenterologist  Procedures: None    Medications:    atorvastatin  40 mg Oral Daily   Chlorhexidine Gluconate Cloth  6 each Topical Daily   feeding supplement (GLUCERNA SHAKE)  237 mL Oral TID BM   fluticasone furoate-vilanterol  1 puff Inhalation Daily   And   umeclidinium bromide  1 puff Inhalation Daily   influenza vac split quadrivalent PF  0.5 mL Intramuscular Tomorrow-1000   insulin aspart  0-15 Units Subcutaneous Q4H   lactulose  20 g Oral TID   oseltamivir  75 mg Oral BID   [START ON 09/06/2021] pantoprazole  40 mg Intravenous Q12H   Continuous Infusions:  sodium chloride Stopped (09/03/21 2123)   cefTRIAXone (ROCEPHIN)  IV Stopped (09/03/21 2134)   lactated ringers 100 mL/hr at 09/04/21 0257   octreotide  (SANDOSTATIN)    IV infusion 50 mcg/hr (09/04/21 0257)   pantoprazole 8 mg/hr (09/04/21 0257)     Anti-infectives (From admission, onward)    Start     Dose/Rate Route Frequency Ordered Stop   09/03/21 2200  oseltamivir (TAMIFLU) capsule 75 mg  Status:  Discontinued  75 mg Oral 2 times daily 09/03/21 0429 09/03/21 1150   09/03/21 2000  cefTRIAXone (ROCEPHIN) 2 g in sodium chloride 0.9 % 100 mL IVPB  Status:  Discontinued        2 g 200 mL/hr over 30 Minutes Intravenous Every 24 hours 09/03/21 0957 09/03/21 1049   09/03/21 2000  cefTRIAXone (ROCEPHIN) 1 g in sodium chloride 0.9 % 100 mL IVPB        1 g 200 mL/hr over 30 Minutes Intravenous Every 24 hours 09/03/21 1049     09/03/21 1245  oseltamivir (TAMIFLU) capsule 75 mg        75 mg Oral 2 times daily 09/03/21 1150 09/08/21 0959   09/02/21 2330  oseltamivir (TAMIFLU) capsule 75 mg  Status:  Discontinued         75 mg Oral  Once 09/02/21 2326 09/03/21 1149   09/02/21 2000  cefTRIAXone (ROCEPHIN) 2 g in sodium chloride 0.9 % 100 mL IVPB  Status:  Discontinued        2 g 200 mL/hr over 30 Minutes Intravenous Every 24 hours 09/02/21 1958 09/03/21 0659   09/02/21 2000  azithromycin (ZITHROMAX) 500 mg in sodium chloride 0.9 % 250 mL IVPB  Status:  Discontinued        500 mg 250 mL/hr over 60 Minutes Intravenous Every 24 hours 09/02/21 1958 09/03/21 0659              Family Communication/Anticipated D/C date and plan/Code Status   DVT prophylaxis: SCDs Start: 09/03/21 0356     Code Status: Full Code  Family Communication: None Disposition Plan: Possible discharge to home in 2 to 3 days   Status is: Inpatient  Remains inpatient appropriate because: Hypoxic, endoscopic work-up         Subjective:   Interval events noted.  He complains of dry cough, shortness of breath. No melena, hematemesis  Objective:    Vitals:   09/04/21 0200 09/04/21 0358 09/04/21 0720 09/04/21 0812  BP: (!) 125/51     Pulse: 83     Resp: (!) 25     Temp:  97.8 F (36.6 C) 100 F (37.8 C)   TempSrc:  Axillary Oral   SpO2: (!) 88%   92%  Weight:  96.4 kg    Height:       No data found.   Intake/Output Summary (Last 24 hours) at 09/04/2021 0834 Last data filed at 09/04/2021 0257 Gross per 24 hour  Intake 2232.38 ml  Output 1700 ml  Net 532.38 ml   Filed Weights   09/02/21 1940 09/04/21 0358  Weight: 94.8 kg 96.4 kg    Exam:  GEN: NAD SKIN: Warm and dry EYES: No pallor or icterus ENT: MMM CV: RRR PULM: Decreased air entry bilaterally, bilateral expiratory wheezing ABD: soft, obese, NT, +BS CNS: AAO x 3, non focal EXT: No edema or tenderness        Data Reviewed:   I have personally reviewed following labs and imaging studies:  Labs: Labs show the following:   Basic Metabolic Panel: Recent Labs  Lab 09/02/21 2015 09/03/21 0427 09/04/21 0434  NA 135 139 137  K 5.5*  5.1 3.9  CL 99 108 104  CO2 26 24 30   GLUCOSE 315* 277* 201*  BUN 52* 52* 18  CREATININE 1.17 0.90 0.68  CALCIUM 8.6* 7.9* 7.5*  MG  --  1.6* 1.7  PHOS  --  3.9  --    GFR Estimated Creatinine  Clearance: 110.1 mL/min (by C-G formula based on SCr of 0.68 mg/dL). Liver Function Tests: Recent Labs  Lab 09/02/21 2015 09/03/21 0427 09/04/21 0434  AST 36 28 34  ALT 39 31 29  ALKPHOS 103 80 73  BILITOT 1.3* 1.3* 0.4  PROT 6.6 5.5* 5.2*  ALBUMIN 3.4* 2.9* 2.8*   No results for input(s): LIPASE, AMYLASE in the last 168 hours. Recent Labs  Lab 09/03/21 0030  AMMONIA 75*   Coagulation profile Recent Labs  Lab 09/02/21 2015 09/03/21 0545  INR 1.2 1.1    CBC: Recent Labs  Lab 09/02/21 2015 09/02/21 2353 09/03/21 0427 09/03/21 0545 09/03/21 1529 09/03/21 2318 09/04/21 0434  WBC 10.2  --  8.7  --   --   --  8.3  HGB 12.9*   < > 11.3* 11.5* 9.9* 9.6* 8.8*  HCT 40.6   < > 34.6* 35.1* 30.3* 29.3* 27.4*  MCV 93.1  --  93.0  --   --   --  96.5  PLT 200  --  168  --   --   --  131*   < > = values in this interval not displayed.   Cardiac Enzymes: No results for input(s): CKTOTAL, CKMB, CKMBINDEX, TROPONINI in the last 168 hours. BNP (last 3 results) No results for input(s): PROBNP in the last 8760 hours. CBG: Recent Labs  Lab 09/03/21 1605 09/03/21 1923 09/03/21 2316 09/04/21 0400 09/04/21 0718  GLUCAP 145* 180* 147* 183* 208*   D-Dimer: No results for input(s): DDIMER in the last 72 hours. Hgb A1c: Recent Labs    09/03/21 0437  HGBA1C 6.8*   Lipid Profile: No results for input(s): CHOL, HDL, LDLCALC, TRIG, CHOLHDL, LDLDIRECT in the last 72 hours. Thyroid function studies: No results for input(s): TSH, T4TOTAL, T3FREE, THYROIDAB in the last 72 hours.  Invalid input(s): FREET3 Anemia work up: No results for input(s): VITAMINB12, FOLATE, FERRITIN, TIBC, IRON, RETICCTPCT in the last 72 hours. Sepsis Labs: Recent Labs  Lab 09/02/21 2015 09/02/21 2353  09/03/21 0427 09/03/21 0721 09/04/21 0434  PROCALCITON  --   --  0.19  --   --   WBC 10.2  --  8.7  --  8.3  LATICACIDVEN 3.2* 3.0*  --  1.5  --     Microbiology Recent Results (from the past 240 hour(s))  Blood culture (routine x 2)     Status: None (Preliminary result)   Collection Time: 09/02/21  8:15 PM   Specimen: BLOOD LEFT HAND  Result Value Ref Range Status   Specimen Description BLOOD LEFT HAND  Final   Special Requests   Final    BOTTLES DRAWN AEROBIC AND ANAEROBIC Blood Culture adequate volume   Culture   Final    NO GROWTH 2 DAYS Performed at [facility], [address] [place], [place] [address]    Report Status PENDING  Incomplete  Blood culture (routine x 2)     Status: None (Preliminary result)   Collection Time: 09/02/21  8:15 PM   Specimen: BLOOD RIGHT HAND  Result Value Ref Range Status   Specimen Description BLOOD RIGHT HAND  Final   Special Requests   Final    BOTTLES DRAWN AEROBIC AND ANAEROBIC Blood Culture adequate volume   Culture   Final    NO GROWTH 2 DAYS Performed at [facility], [address], [place], [place] [address]    Report Status PENDING  Incomplete  Resp Panel by RT-PCR (Flu A&B, Covid) Nasopharyngeal Swab  Status: Abnormal   Collection Time: 09/02/21  8:40 PM   Specimen: Nasopharyngeal Swab; Nasopharyngeal(NP) swabs in vial transport medium  Result Value Ref Range Status   SARS Coronavirus 2 by RT PCR NEGATIVE NEGATIVE Final    Comment: (NOTE) SARS-CoV-2 target nucleic acids are NOT DETECTED.  The SARS-CoV-2 RNA is generally detectable in upper respiratory specimens during the acute phase of infection. The lowest concentration of SARS-CoV-2 viral copies this assay can detect is 138 copies/mL. A negative result does not preclude SARS-Cov-2 infection and should not be used as the sole basis for treatment or other patient management decisions. A negative result may occur with  improper specimen collection/handling, submission  of specimen other than nasopharyngeal swab, presence of viral mutation(s) within the areas targeted by this assay, and inadequate number of viral copies(<138 copies/mL). A negative result must be combined with clinical observations, patient history, and epidemiological information. The expected result is Negative.  Fact Sheet for Patients:  EntrepreneurPulse.com.au  Fact Sheet for Healthcare Providers:  IncredibleEmployment.be  This test is no t yet approved or cleared by the [place] FDA and  has been authorized for detection and/or diagnosis of SARS-CoV-2 by FDA under an Emergency Use Authorization (EUA). This EUA will remain  in effect (meaning this test can be used) for the duration of the COVID-19 declaration under Section 564(b)(1) of the Act, 21 U.S.C.section 360bbb-3(b)(1), unless the authorization is terminated  or revoked sooner.       Influenza A by PCR POSITIVE (A) NEGATIVE Final   Influenza B by PCR NEGATIVE NEGATIVE Final    Comment: (NOTE) The Xpert Xpress SARS-CoV-2/FLU/RSV plus assay is intended as an aid in the diagnosis of influenza from Nasopharyngeal swab specimens and should not be used as a sole basis for treatment. Nasal washings and aspirates are unacceptable for Xpert Xpress SARS-CoV-2/FLU/RSV testing.  Fact Sheet for Patients: EntrepreneurPulse.com.au  Fact Sheet for Healthcare Providers: IncredibleEmployment.be  This test is not yet approved or cleared by the [place] FDA and has been authorized for detection and/or diagnosis of SARS-CoV-2 by FDA under an Emergency Use Authorization (EUA). This EUA will remain in effect (meaning this test can be used) for the duration of the COVID-19 declaration under Section 564(b)(1) of the Act, 21 U.S.C. section 360bbb-3(b)(1), unless the authorization is terminated or revoked.  Performed at [facility], [address],  [place], [place] [address]   MRSA Next Gen by PCR, Nasal     Status: None   Collection Time: 09/03/21  4:19 PM   Specimen: Nasal Mucosa; Nasal Swab  Result Value Ref Range Status   MRSA by PCR Next Gen NOT DETECTED NOT DETECTED Final    Comment: (NOTE) The GeneXpert MRSA Assay (FDA approved for NASAL specimens only), is one component of a comprehensive MRSA colonization surveillance program. It is not intended to diagnose MRSA infection nor to guide or monitor treatment for MRSA infections. Test performance is not FDA approved in patients less than 28 years old. Performed at [facility], [address] [place], [place] [address]     Procedures and diagnostic studies:  DG Chest 2 View  Result Date: 09/02/2021 CLINICAL DATA:  Shortness of breath and chest pain.  Cough.  Fever. EXAM: CHEST - 2 VIEW COMPARISON:  05/13/2021 trauma CT 01/25/2021 FINDINGS: Prominent heart size may be accentuated by portable technique. Right-sided aortic arch. Implanted loop recorder in the left chest wall. Mild diffuse peribronchial thickening. Subsegmental opacities in the bases. No large pleural effusion. No  pneumothorax. Thoracic spondylosis. IMPRESSION: 1. Cardiomegaly. Peribronchial thickening may be bronchitis or congestive. 2. Subsegmental opacities at the bases favor atelectasis or scarring. Electronically Signed   By: Keith Rake M.D.   On: 09/02/2021 20:39   DG CHEST PORT 1 VIEW  Result Date: 09/04/2021 CLINICAL DATA:  Shortness of breath EXAM: PORTABLE CHEST 1 VIEW COMPARISON:  09/02/2021 FINDINGS: 0738 hours. The cardio pericardial silhouette is enlarged. There is pulmonary vascular congestion without overt pulmonary edema. Similar appearance of probable basilar atelectasis bilaterally. The visualized bony structures of the thorax show no acute abnormality. Telemetry leads overlie the chest. IMPRESSION: 1. Enlarged cardiac silhouette with pulmonary vascular congestion. 2. Stable basilar atelectasis.  Electronically Signed   By: Misty Stanley M.D.   On: 09/04/2021 08:01   CT ANGIO GI BLEED  Result Date: 09/03/2021 CLINICAL DATA:  Gross hematochezia with abdominal discomfort. EXAM: CTA ABDOMEN AND PELVIS WITHOUT AND WITH CONTRAST TECHNIQUE: Multidetector CT imaging of the abdomen and pelvis was performed using the standard protocol during bolus administration of intravenous contrast. Multiplanar reconstructed images and MIPs were obtained and reviewed to evaluate the vascular anatomy. CONTRAST:  16mL OMNIPAQUE IOHEXOL 350 MG/ML SOLN COMPARISON:  Prior CT scan of the abdomen 03/09/2019 FINDINGS: VASCULAR Aorta: Normal in caliber. No evidence of aneurysm. Scattered atherosclerotic plaque. Celiac: Patent without evidence of aneurysm, dissection, vasculitis or significant stenosis. SMA: Patent without evidence of aneurysm, dissection, vasculitis or significant stenosis. Renals: Both main renal arteries are patent without evidence of aneurysm, dissection, vasculitis, fibromuscular dysplasia or significant stenosis. Small accessory renal artery to the right lower pole. IMA: Patent without evidence of aneurysm, dissection, vasculitis or significant stenosis. Inflow: Patent without evidence of aneurysm, dissection, vasculitis or significant stenosis. Proximal Outflow: Bilateral common femoral and visualized portions of the superficial and profunda femoral arteries are patent without evidence of aneurysm, dissection, vasculitis or significant stenosis. Veins: No focal venous abnormality. Review of the MIP images confirms the above findings. NON-VASCULAR Lower chest: Respiratory motion artifact. Trace dependent atelectasis. No acute abnormality. Hepatobiliary: Nodular liver contour with relative hypertrophy of the left hepatic lobe and atrophy of the right hepatic lobe. Morphologic changes are consistent with hepatic cirrhosis. No arterially enhancing lesion. Gallbladder is unremarkable. No intra or extrahepatic biliary  ductal dilatation. Pancreas: Unremarkable. No pancreatic ductal dilatation or surrounding inflammatory changes. Spleen: No splenic injury or perisplenic hematoma. Adrenals/Urinary Tract: Adrenal glands are unremarkable. Kidneys are normal, without renal calculi, focal lesion, or hydronephrosis. Bladder is unremarkable. Stomach/Bowel: Colonic diverticular disease without CT evidence of active inflammation. No evidence of extravasation of contrast material to suggest a source for acute bleeding. No evidence of focal bowel wall thickening or obstruction. Normal appendix. Lymphatic: No suspicious lymphadenopathy. Reproductive: Prostate is unremarkable. Other: No abdominal wall hernia or abnormality. No abdominopelvic ascites. Musculoskeletal: No acute fracture, malalignment or osseous lesion. Multilevel degenerative disc disease and lower lumbar facet arthropathy. IMPRESSION: VASCULAR 1. No evidence of active arterial bleeding at this time. 2. Scattered atherosclerotic plaque without evidence of stenosis, dissection or aneurysm. Aortic Atherosclerosis (ICD10-I70.0). NON-VASCULAR 1. Colonic diverticular disease without CT evidence of active inflammation or bleeding at this time. 2. Hepatic cirrhosis. 3. Multilevel degenerative disc disease and lower lumbar facet arthropathy. Electronically Signed   By: Jacqulynn Cadet M.D.   On: 09/03/2021 16:54               LOS: 1 day   Tryson Lumley  [facility]   Pager on [email or web address]. If 7PM-7AM, please contact night-coverage at [email or web address]  09/04/2021, 8:34 AM

## 2021-09-04 NOTE — Progress Notes (Signed)
Subjective: Patient reports he is not doing well this morning, he states that he can only breathe when sitting up on the side of the bed. He denies any BMs today and has not noticed any rectal bleeding today. He does report several episodes of diarrhea yesterday evening he states that stools were more maroon at that time. He denies nausea or vomiting at this time.   Objective: Vital signs in last 24 hours: Temp:  [97.5 F (36.4 C)-100 F (37.8 C)] 100 F (37.8 C) (01/04 0720) Pulse Rate:  [70-99] 83 (01/04 0200) Resp:  [17-35] 25 (01/04 0200) BP: (81-129)/(46-68) 125/51 (01/04 0200) SpO2:  [56 %-98 %] 92 % (01/04 0812) Weight:  [96.4 kg] 96.4 kg (01/04 0358) Last BM Date: 09/03/21 General:   Alert and oriented, pleasant Head:  Normocephalic and atraumatic. Eyes:  No icterus, sclera clear. Conjuctiva pink.  Mouth:  Without lesions, mucosa pink and moist.  Neck:  Supple, without thyromegaly or masses.  Heart:  S1, S2 present, no murmurs noted.  Lungs: Clear to auscultation bilaterally, without wheezing, rales, or rhonchi.  Abdomen:  Bowel sounds present, full but not taut, mild TTP RUQ, No HSM, or hernias noted. No rebound or guarding. No masses appreciated  Msk:  Symmetrical without gross deformities. Normal posture. Pulses:  Normal pulses noted. Extremities:  Without clubbing or edema Neurologic:  Alert and  oriented x4;  grossly normal neurologically. Skin:  Warm and dry, intact without significant lesions.  Psych:  Alert and cooperative. Normal mood and affect.  Intake/Output from previous day: 01/03 0701 - 01/04 0700 In: 2232.4 [I.V.:2167.8; IV Piggyback:64.6] Out: 1700 [Stool:1700] Intake/Output this shift: No intake/output data recorded.  Lab Results: Recent Labs    09/02/21 2015 09/02/21 2353 09/03/21 0427 09/03/21 0545 09/03/21 1529 09/03/21 2318 09/04/21 0434  WBC 10.2  --  8.7  --   --   --  8.3  HGB 12.9*   < > 11.3*   < > 9.9* 9.6* 8.8*  HCT 40.6   < >  34.6*   < > 30.3* 29.3* 27.4*  PLT 200  --  168  --   --   --  131*   < > = values in this interval not displayed.   BMET Recent Labs    09/02/21 2015 09/03/21 0427 09/04/21 0434  NA 135 139 137  K 5.5* 5.1 3.9  CL 99 108 104  CO2 26 24 30   GLUCOSE 315* 277* 201*  BUN 52* 52* 18  CREATININE 1.17 0.90 0.68  CALCIUM 8.6* 7.9* 7.5*   LFT Recent Labs    09/02/21 2015 09/03/21 0427 09/04/21 0434  PROT 6.6 5.5* 5.2*  ALBUMIN 3.4* 2.9* 2.8*  AST 36 28 34  ALT 39 31 29  ALKPHOS 103 80 73  BILITOT 1.3* 1.3* 0.4   PT/INR Recent Labs    09/02/21 2015 09/03/21 0545  LABPROT 15.3* 14.7  INR 1.2 1.1   Studies/Results: DG Chest 2 View  Result Date: 09/02/2021 CLINICAL DATA:  Shortness of breath and chest pain.  Cough.  Fever. EXAM: CHEST - 2 VIEW COMPARISON:  05/13/2021 trauma CT 01/25/2021 FINDINGS: Prominent heart size may be accentuated by portable technique. Right-sided aortic arch. Implanted loop recorder in the left chest wall. Mild diffuse peribronchial thickening. Subsegmental opacities in the bases. No large pleural effusion. No pneumothorax. Thoracic spondylosis. IMPRESSION: 1. Cardiomegaly. Peribronchial thickening may be bronchitis or congestive. 2. Subsegmental opacities at the bases favor atelectasis or scarring. Electronically Signed   By:  Keith Rake M.D.   On: 09/02/2021 20:39   DG CHEST PORT 1 VIEW  Result Date: 09/04/2021 CLINICAL DATA:  Shortness of breath EXAM: PORTABLE CHEST 1 VIEW COMPARISON:  09/02/2021 FINDINGS: 0738 hours. The cardio pericardial silhouette is enlarged. There is pulmonary vascular congestion without overt pulmonary edema. Similar appearance of probable basilar atelectasis bilaterally. The visualized bony structures of the thorax show no acute abnormality. Telemetry leads overlie the chest. IMPRESSION: 1. Enlarged cardiac silhouette with pulmonary vascular congestion. 2. Stable basilar atelectasis. Electronically Signed   By: Misty Stanley  M.D.   On: 09/04/2021 08:01   CT ANGIO GI BLEED  Result Date: 09/03/2021 CLINICAL DATA:  Gross hematochezia with abdominal discomfort. EXAM: CTA ABDOMEN AND PELVIS WITHOUT AND WITH CONTRAST TECHNIQUE: Multidetector CT imaging of the abdomen and pelvis was performed using the standard protocol during bolus administration of intravenous contrast. Multiplanar reconstructed images and MIPs were obtained and reviewed to evaluate the vascular anatomy. CONTRAST:  173mL OMNIPAQUE IOHEXOL 350 MG/ML SOLN COMPARISON:  Prior CT scan of the abdomen 03/09/2019 FINDINGS: VASCULAR Aorta: Normal in caliber. No evidence of aneurysm. Scattered atherosclerotic plaque. Celiac: Patent without evidence of aneurysm, dissection, vasculitis or significant stenosis. SMA: Patent without evidence of aneurysm, dissection, vasculitis or significant stenosis. Renals: Both main renal arteries are patent without evidence of aneurysm, dissection, vasculitis, fibromuscular dysplasia or significant stenosis. Small accessory renal artery to the right lower pole. IMA: Patent without evidence of aneurysm, dissection, vasculitis or significant stenosis. Inflow: Patent without evidence of aneurysm, dissection, vasculitis or significant stenosis. Proximal Outflow: Bilateral common femoral and visualized portions of the superficial and profunda femoral arteries are patent without evidence of aneurysm, dissection, vasculitis or significant stenosis. Veins: No focal venous abnormality. Review of the MIP images confirms the above findings. NON-VASCULAR Lower chest: Respiratory motion artifact. Trace dependent atelectasis. No acute abnormality. Hepatobiliary: Nodular liver contour with relative hypertrophy of the left hepatic lobe and atrophy of the right hepatic lobe. Morphologic changes are consistent with hepatic cirrhosis. No arterially enhancing lesion. Gallbladder is unremarkable. No intra or extrahepatic biliary ductal dilatation. Pancreas:  Unremarkable. No pancreatic ductal dilatation or surrounding inflammatory changes. Spleen: No splenic injury or perisplenic hematoma. Adrenals/Urinary Tract: Adrenal glands are unremarkable. Kidneys are normal, without renal calculi, focal lesion, or hydronephrosis. Bladder is unremarkable. Stomach/Bowel: Colonic diverticular disease without CT evidence of active inflammation. No evidence of extravasation of contrast material to suggest a source for acute bleeding. No evidence of focal bowel wall thickening or obstruction. Normal appendix. Lymphatic: No suspicious lymphadenopathy. Reproductive: Prostate is unremarkable. Other: No abdominal wall hernia or abnormality. No abdominopelvic ascites. Musculoskeletal: No acute fracture, malalignment or osseous lesion. Multilevel degenerative disc disease and lower lumbar facet arthropathy. IMPRESSION: VASCULAR 1. No evidence of active arterial bleeding at this time. 2. Scattered atherosclerotic plaque without evidence of stenosis, dissection or aneurysm. Aortic Atherosclerosis (ICD10-I70.0). NON-VASCULAR 1. Colonic diverticular disease without CT evidence of active inflammation or bleeding at this time. 2. Hepatic cirrhosis. 3. Multilevel degenerative disc disease and lower lumbar facet arthropathy. Electronically Signed   By: Jacqulynn Cadet M.D.   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 Found to have hypotension, hypoxia, lactic acidosis, normocytic anemia, hyperkalemia,  elevated ammonia and influenza A. GI consulted for GI bleeding.   GI bleed: hematochezia onset 09/01/21 without constipation, diarrhea or abdominal pain initially,  though he developed abdominal pain on 09/02/21 felt to be associated with coughing.  Initially there was a question of hematemesis, however, after further discussion it was determined to be hemoptysis. Hgb 12.9 on admission, down from 15 in august.  Large, loose, bloody BMs while in the ED with drop in hgb to 9.5 and associated hypotension. Received 2 units PRBCs. Hgb improved to 11.3 but is back down to 8.8 this morning. Remains on PPI and octreotide infusion. Takes 1 baby asa per day at home, no other NSAIDs or anticoagulants.  Cannot rule out LGIB from diverticulosis, colon polyps, malignancy, colonic AVMs, colitis/ischemic colitis, and though UGIB seems less likely at this time given BUN has normalized, patient did report some maroon colored stools yesterday, reassuringly there was no presence of esophageal varices on previous EGD in March 2021, however, varices should still be a consideration as possible etiology. Endoscopic procedures on hold until respiratory status stabilizes, patient will need colonoscopy during admission and likely EGD as well.  Diarrhea: likely secondary to GI bleed/intiation of lactulose for elevated ammonia level. No BMs or rectal bleeding today but had several episodes yesterday with maroon stools.   Cirrhosis: secondary to both fatty liver and Etoh, last alcohol consumption reportedly 3 weeks ago, MELD 7 (INR from yesterday) and previously well compensated, though he did report some disorientation prior to admission with elevated ammonia of 75 initially. Lactulose was started. A/Ox3 today. He reports no increasing abdominal distention or peripheral edema. RUQ [place] up to date August 2022. LFTs WNL with albumin of 2.8, INR 1.1 yesterday, will repeat today. Plt down to 131K from 200K on admission. He is continued on Rocephin 1g for SBP prophylaxis, given the data reveals better outcomes with higher dosing, will switch to rocephin IV 2g Q24H.  Plan: Continue to trend H&H, monitor for  overt GI bleeding, transfuse as needed INR daily LFTs daily Continue PPI and octreotide infusion Continue SBP prophylaxis with rocephin Q24H, increase to 2g vs1g Continue lactulose with goal 2-3 soft BMs/day Monitor for signs of HE Alcohol cessation imperative Plan for colonoscopy, possible EGD once respiratory status has improved Can advance to clear liquid diet today given endoscopic procedures are on hold   LOS: 1 day    09/04/2021, 9:07 AM   Zhara Gieske L. Alver Sorrow, MSN, APRN, AGNP-C Adult-Gerontology Nurse Practitioner [facility]

## 2021-09-05 ENCOUNTER — Encounter: Payer: Medicare Other | Admitting: Internal Medicine

## 2021-09-05 LAB — BASIC METABOLIC PANEL
Anion gap: 5 (ref 5–15)
BUN: 9 mg/dL (ref 6–20)
CO2: 32 mmol/L (ref 22–32)
Calcium: 7.6 mg/dL — ABNORMAL LOW (ref 8.9–10.3)
Chloride: 98 mmol/L (ref 98–111)
Creatinine, Ser: 0.72 mg/dL (ref 0.61–1.24)
GFR, Estimated: >60 mL/min (ref >60)
Glucose, Bld: 158 mg/dL — ABNORMAL HIGH (ref 70–99)
Potassium: 3.8 mmol/L (ref 3.5–5.1)
Sodium: 135 mmol/L (ref 135–145)

## 2021-09-05 LAB — CBC WITH DIFFERENTIAL/PLATELET
Abs Immature Granulocytes: 0.01 10*3/uL (ref 0.00–0.07)
Basophils Absolute: 0 10*3/uL (ref 0.0–0.1)
Basophils Relative: 0 %
Eosinophils Absolute: 0.2 10*3/uL (ref 0.0–0.5)
Eosinophils Relative: 3 %
HCT: 25.1 % — ABNORMAL LOW (ref 39.0–52.0)
Hemoglobin: 8.2 g/dL — ABNORMAL LOW (ref 13.0–17.0)
Immature Granulocytes: 0 %
Lymphocytes Relative: 24 %
Lymphs Abs: 1.2 10*3/uL (ref 0.7–4.0)
MCH: 31.2 pg (ref 26.0–34.0)
MCHC: 32.7 g/dL (ref 30.0–36.0)
MCV: 95.4 fL (ref 80.0–100.0)
Monocytes Absolute: 0.5 10*3/uL (ref 0.1–1.0)
Monocytes Relative: 9 %
Neutro Abs: 3.1 10*3/uL (ref 1.7–7.7)
Neutrophils Relative %: 64 %
Platelets: 142 10*3/uL — ABNORMAL LOW (ref 150–400)
RBC: 2.63 MIL/uL — ABNORMAL LOW (ref 4.22–5.81)
RDW: 14.6 % (ref 11.5–15.5)
WBC: 4.9 10*3/uL (ref 4.0–10.5)
nRBC: 0 % (ref 0.0–0.2)

## 2021-09-05 LAB — GLUCOSE, CAPILLARY
Glucose-Capillary: 115 mg/dL — ABNORMAL HIGH (ref 70–99)
Glucose-Capillary: 117 mg/dL — ABNORMAL HIGH (ref 70–99)
Glucose-Capillary: 125 mg/dL — ABNORMAL HIGH (ref 70–99)
Glucose-Capillary: 138 mg/dL — ABNORMAL HIGH (ref 70–99)
Glucose-Capillary: 157 mg/dL — ABNORMAL HIGH (ref 70–99)
Glucose-Capillary: 168 mg/dL — ABNORMAL HIGH (ref 70–99)

## 2021-09-05 LAB — EXPECTORATED SPUTUM ASSESSMENT W GRAM STAIN, RFLX TO RESP C

## 2021-09-05 LAB — HEMOGLOBIN AND HEMATOCRIT, BLOOD
HCT: 25.8 % — ABNORMAL LOW (ref 39.0–52.0)
HCT: 26 % — ABNORMAL LOW (ref 39.0–52.0)
HCT: 26.1 % — ABNORMAL LOW (ref 39.0–52.0)
Hemoglobin: 8.4 g/dL — ABNORMAL LOW (ref 13.0–17.0)
Hemoglobin: 8.5 g/dL — ABNORMAL LOW (ref 13.0–17.0)
Hemoglobin: 8.6 g/dL — ABNORMAL LOW (ref 13.0–17.0)

## 2021-09-05 LAB — URINE CULTURE: Culture: NO GROWTH

## 2021-09-05 LAB — STREP PNEUMONIAE URINARY ANTIGEN: Strep Pneumo Urinary Antigen: NEGATIVE

## 2021-09-05 MED ORDER — SODIUM CHLORIDE 0.9 % IV SOLN
1.0000 g | INTRAVENOUS | Status: DC
  Administered 2021-09-05: 1 g via INTRAVENOUS
  Filled 2021-09-05: qty 10

## 2021-09-05 MED ORDER — INSULIN ASPART 100 UNIT/ML IJ SOLN
0.0000 [IU] | Freq: Three times a day (TID) | INTRAMUSCULAR | Status: DC
  Administered 2021-09-05: 3 [IU] via SUBCUTANEOUS
  Administered 2021-09-06 (×2): 2 [IU] via SUBCUTANEOUS

## 2021-09-05 MED ORDER — LACTULOSE 10 GM/15ML PO SOLN
20.0000 g | Freq: Two times a day (BID) | ORAL | Status: DC
  Administered 2021-09-05: 20 g via ORAL
  Filled 2021-09-05 (×2): qty 30

## 2021-09-05 MED ORDER — SODIUM CHLORIDE 0.9 % IV SOLN: INTRAVENOUS | Status: DC

## 2021-09-05 NOTE — Progress Notes (Signed)
Subjective:  Feels better. Breathing improved when sitting up but still has some sob when laying down. Having at least 3-5 loose nonbloody stools daily for past couple of days on lactulose. Yellow in appearance on last episode. No abdominal pain. No bloating. Tolerated clear liquids. Coughing up nonbloody sputum. Temp of 100 over night.   Objective: Vital signs in last 24 hours: Temp:  [98 F (36.7 C)-100 F (37.8 C)] 98.2 F (36.8 C) (01/05 0747) Pulse Rate:  [68-86] 77 (01/05 0500) Resp:  [14-31] 26 (01/05 0500) BP: (77-127)/(35-68) 107/53 (01/05 0500) SpO2:  [71 %-97 %] 96 % (01/05 0743) Weight:  [96.5 kg] 96.5 kg (01/05 0500) Last BM Date: 09/05/21 General:   Alert,  Well-developed, well-nourished, pleasant and cooperative in NAD Head:  Normocephalic and atraumatic. Eyes:  Sclera clear, no icterus.  Chest: CTA clear but decreased breath sounds in the bases.    Heart:  Regular rate and rhythm; no murmurs, clicks, rubs,  or gallops. Abdomen:  Soft, nontender and nondistended.  Normal bowel sounds, without guarding, and without rebound.   Extremities:  Without clubbing, deformity. Trace bilateral edema. Neurologic:  Alert and  oriented x4;  grossly normal neurologically. Psych:  Alert and cooperative. Normal mood and affect.  Intake/Output from previous day: 01/04 0701 - 01/05 0700 In: 2287.2 [P.O.:1320; I.V.:867.2; IV Piggyback:100] Out: 3950 [Urine:3950] Intake/Output this shift: No intake/output data recorded.  Lab Results: CBC Recent Labs    09/03/21 0427 09/03/21 0545 09/04/21 0434 09/04/21 1228 09/04/21 1508 09/05/21 0415 09/05/21 0725  WBC 8.7  --  8.3  --   --  4.9  --   HGB 11.3*   < > 8.8*   < > 9.4* 8.2* 8.4*  HCT 34.6*   < > 27.4*   < > 29.8* 25.1* 25.8*  MCV 93.0  --  96.5  --   --  95.4  --   PLT 168  --  131*  --   --  142*  --    < > = values in this interval not displayed.   BMET Recent Labs    09/03/21 0427 09/04/21 0434 09/05/21 0415  NA 139  137 135  K 5.1 3.9 3.8  CL 108 104 98  CO2 24 30 32  GLUCOSE 277* 201* 158*  BUN 52* 18 9  CREATININE 0.90 0.68 0.72  CALCIUM 7.9* 7.5* 7.6*   LFTs Recent Labs    09/02/21 2015 09/03/21 0427 09/04/21 0434  BILITOT 1.3* 1.3* 0.4  ALKPHOS 103 80 73  AST 36 28 34  ALT 39 31 29  PROT 6.6 5.5* 5.2*  ALBUMIN 3.4* 2.9* 2.8*   No results for input(s): LIPASE in the last 72 hours. PT/INR Recent Labs    09/02/21 2015 09/03/21 0545 09/04/21 1228  LABPROT 15.3* 14.7 13.2  INR 1.2 1.1 1.0      Imaging Studies: DG Chest 2 View  Result Date: 09/02/2021 CLINICAL DATA:  Shortness of breath and chest pain.  Cough.  Fever. EXAM: CHEST - 2 VIEW COMPARISON:  05/13/2021 trauma CT 01/25/2021 FINDINGS: Prominent heart size may be accentuated by portable technique. Right-sided aortic arch. Implanted loop recorder in the left chest wall. Mild diffuse peribronchial thickening. Subsegmental opacities in the bases. No large pleural effusion. No pneumothorax. Thoracic spondylosis. IMPRESSION: 1. Cardiomegaly. Peribronchial thickening may be bronchitis or congestive. 2. Subsegmental opacities at the bases favor atelectasis or scarring. Electronically Signed   By: Keith Rake M.D.   On: 09/02/2021 20:39  DG CHEST PORT 1 VIEW  Result Date: 09/04/2021 CLINICAL DATA:  Shortness of breath EXAM: PORTABLE CHEST 1 VIEW COMPARISON:  09/02/2021 FINDINGS: 0738 hours. The cardio pericardial silhouette is enlarged. There is pulmonary vascular congestion without overt pulmonary edema. Similar appearance of probable basilar atelectasis bilaterally. The visualized bony structures of the thorax show no acute abnormality. Telemetry leads overlie the chest. IMPRESSION: 1. Enlarged cardiac silhouette with pulmonary vascular congestion. 2. Stable basilar atelectasis. Electronically Signed   By: Misty Stanley M.D.   On: 09/04/2021 08:01   CT ANGIO GI BLEED  Result Date: 09/03/2021 CLINICAL DATA:  Gross hematochezia with  abdominal discomfort. EXAM: CTA ABDOMEN AND PELVIS WITHOUT AND WITH CONTRAST TECHNIQUE: Multidetector CT imaging of the abdomen and pelvis was performed using the standard protocol during bolus administration of intravenous contrast. Multiplanar reconstructed images and MIPs were obtained and reviewed to evaluate the vascular anatomy. CONTRAST:  153m OMNIPAQUE IOHEXOL 350 MG/ML SOLN COMPARISON:  Prior CT scan of the abdomen 03/09/2019 FINDINGS: VASCULAR Aorta: Normal in caliber. No evidence of aneurysm. Scattered atherosclerotic plaque. Celiac: Patent without evidence of aneurysm, dissection, vasculitis or significant stenosis. SMA: Patent without evidence of aneurysm, dissection, vasculitis or significant stenosis. Renals: Both main renal arteries are patent without evidence of aneurysm, dissection, vasculitis, fibromuscular dysplasia or significant stenosis. Small accessory renal artery to the right lower pole. IMA: Patent without evidence of aneurysm, dissection, vasculitis or significant stenosis. Inflow: Patent without evidence of aneurysm, dissection, vasculitis or significant stenosis. Proximal Outflow: Bilateral common femoral and visualized portions of the superficial and profunda femoral arteries are patent without evidence of aneurysm, dissection, vasculitis or significant stenosis. Veins: No focal venous abnormality. Review of the MIP images confirms the above findings. NON-VASCULAR Lower chest: Respiratory motion artifact. Trace dependent atelectasis. No acute abnormality. Hepatobiliary: Nodular liver contour with relative hypertrophy of the left hepatic lobe and atrophy of the right hepatic lobe. Morphologic changes are consistent with hepatic cirrhosis. No arterially enhancing lesion. Gallbladder is unremarkable. No intra or extrahepatic biliary ductal dilatation. Pancreas: Unremarkable. No pancreatic ductal dilatation or surrounding inflammatory changes. Spleen: No splenic injury or perisplenic  hematoma. Adrenals/Urinary Tract: Adrenal glands are unremarkable. Kidneys are normal, without renal calculi, focal lesion, or hydronephrosis. Bladder is unremarkable. Stomach/Bowel: Colonic diverticular disease without CT evidence of active inflammation. No evidence of extravasation of contrast material to suggest a source for acute bleeding. No evidence of focal bowel wall thickening or obstruction. Normal appendix. Lymphatic: No suspicious lymphadenopathy. Reproductive: Prostate is unremarkable. Other: No abdominal wall hernia or abnormality. No abdominopelvic ascites. Musculoskeletal: No acute fracture, malalignment or osseous lesion. Multilevel degenerative disc disease and lower lumbar facet arthropathy. IMPRESSION: VASCULAR 1. No evidence of active arterial bleeding at this time. 2. Scattered atherosclerotic plaque without evidence of stenosis, dissection or aneurysm. Aortic Atherosclerosis (ICD10-I70.0). NON-VASCULAR 1. Colonic diverticular disease without CT evidence of active inflammation or bleeding at this time. 2. Hepatic cirrhosis. 3. Multilevel degenerative disc disease and lower lumbar facet arthropathy. Electronically Signed   By: HJacqulynn CadetM.D.   On: 09/03/2021 16:54  [2 weeks]   Assessment:  56year old male with medical history of hypertension, type 2 diabetes mellitus, STEMI and NSTEMI, s/p pacemaker placement, CHF, hyperlipidemia, COPD, cirrhosis due to fatty liver and alcohol, Barrett's esophagus, adenomatous colon polyps presenting to the ED via EMS for 2-day history of chest soreness occurring primarily with coughing/fever, disorientation, lightheadedness, hematochezia.  In the ED he was found to be hypotensive, hypoxia, lactic acidosis, normocytic anemia, hyperkalemia, elevated ammonia, influenza  A positive.  GI consulted for GI bleeding.  GI bleeding: Hematochezia, onset January 1. Reports several episodes, at least 5-6 total including in the ER. Stools maroon, red to dark  red. No melena. Confirmed by DRE. No constipation, diarrhea, abdominal pain initially although developed abdominal pain on January 2 which she felt to be due to coughing.  Per Dr. Laural Golden, ED called him reporting patient vomited 600cc of bloody emesis while in ED. Hemoglobin 12.9 on admission, down from 15 in August.  Large, loose, bloody bowel movement while in the ED with drop in hemoglobin to 9.5 and associated with hypotension.  He has received 2 units of packed red blood cells.  Hemoglobin improved to 11.3, down to 8.8 yesterday and hemoglobin of 8.4 today.  He is on PPI, octreotide infusion.  At home he takes low-dose aspirin daily, no other NSAIDs or anticoagulants. He had a EGD in March 2021, no varices present at that time.  Colonoscopy March 2018 with diverticulosis in the sigmoid and descending colon, several polyps removed both tubular adenomas and hyperplastic. CTA A/P this admission without evidence of active inflammation or bleeding. Recommend upper endoscopy this admission, currently on hold due to respiratory status. If negative, he may require inpatient colonoscopy. If source of bleeding found on upper endoscopy, then consider outpatient colonoscopy. Currently patient remains on oxygen 2.5L/min with O2 sats of 96%. Systolic pressures in the 100s with exception of during the evening in the 70-80s briefly.H/H stable at 8.4/25.8.  Diarrhea: Likely due to GI bleed and initiation of lactulose for elevated ammonia level. Will decrease dose, titrate to 2-3 soft stools daily.  Cirrhosis: Felt to be due to both fatty liver and alcohol use.  Last alcohol consumption reportedly 3 weeks ago.  Previously has been well compensated, though he has reported some disorientation prior to admission and ammonia noted to be mildly elevated at 75 initially.  Lactulose has been started.  Denies any abdominal distention or peripheral edema.  Right upper quadrant ultrasound August 2022.  LFTs this admission normal with  exception of albumin 2.8.  His INR 1.1.  Platelets 131,000.  MELD 6 yesterday. On Rocephin 1 g every 24 hours for SBP prophylaxis.   Influenza A: improved. Remains on oxygen 2.5L/min via [place]. O2 sats  96%. Tmax 24 hours, 100.0.  Plan: Likely EGD tomorrow.  Continue clear liquids. NPO after midnight. Reassess in AM. Continue PPI and octreotide infusions. Decrease lactulose, titrate to 2-3 soft stools daily. Trend C-Met, INR, CBC daily. Transfuse as needed. Recommend complete alcohol cessation. Rocephin 1 gram IV every 24 hours recommended for SBP prophylaxis.  Laureen Ochs. Bernarda Caffey [facility] [phone] 1/5/202310:18 AM    LOS: 2 days

## 2021-09-05 NOTE — Progress Notes (Signed)
Progress Note    Randy Hebert  TOI:[id number] DOB: 1966-02-22  DOA: 09/02/2021 PCP: Lindell Spar, MD      Brief Narrative:    Medical records reviewed and are as summarized below:   Randy Hebert is a 56 y.o. male with medical history significant for essential hypertension, T2DM, CAD, STEMI (2016), NSTEMI (2018), CHF, hyperlipidemia, COPD, liver cirrhosis, who presented to the hospital with pleuritic chest pain, cough, fever, nausea, vomiting, diarrhea, hematemesis and bloody stools.      Assessment/Plan:   Principal Problem:   Upper GI bleed Active Problems:   Essential hypertension   Chronic systolic CHF (congestive heart failure) (HCC)   COPD (chronic obstructive pulmonary disease) (HCC)   Mixed hyperlipidemia   OSA (obstructive sleep apnea)   Hyperglycemia due to diabetes mellitus (HCC)   Acute respiratory failure with hypoxia (HCC)   Diarrhea   Cirrhosis of liver (HCC)   Influenza A   Hypoalbuminemia due to protein-calorie malnutrition (HCC)   Lactic acidosis   Elevated troponin   Obesity (BMI 30.0-34.9)   Hyperkalemia   Hyperammonemia (HCC)    Body mass index is 35.4 kg/m.  (Obesity)  Influenza A infection: Continue Tamiflu  Acute hypoxemic respiratory failure: He is down from 5 L to 4 L/min oxygen via nasal cannula.  Continue attempts to wean down oxygen.  He does not use oxygen at home.  Acute GI bleeding, positive heme stools: Continue IV Protonix and IV octreotide infusion.  Plan for endoscopic evaluation tomorrow.  Follow-up with GI.  Acute blood loss anemia: H&H is stable and there is no indication for blood transfusion at this time.  Monitor H&H and transfuse as needed.  Pulmonary vascular congestion on chest x-ray, probable fluid overload: s/p IV Lasix on 09/04/2021  Hyperkalemia, hypomagnesemia: Improved  Hypotension: BP is better but BP still soft.  Continue to monitor.  COPD: Continue bronchodilators  Liver cirrhosis,  elevated ammonia level: Continue lactulose  OSA: He said he has not used CPAP for a long time because he was homeless at some point.  Follow-up with pulmonologist for sleep study/further evaluation  Other comorbidities include CAD, chronic systolic CHF, hypertension, type II DM   Diet Order             Diet NPO time specified  Diet effective midnight           Diet clear liquid Room service appropriate? Yes; Fluid consistency: Thin  Diet effective now                      Consultants: Gastroenterologist  Procedures: None    Medications:    atorvastatin  40 mg Oral Daily   Chlorhexidine Gluconate Cloth  6 each Topical Daily   feeding supplement (GLUCERNA SHAKE)  237 mL Oral TID BM   fluticasone furoate-vilanterol  1 puff Inhalation Daily   And   umeclidinium bromide  1 puff Inhalation Daily   influenza vac split quadrivalent PF  0.5 mL Intramuscular Tomorrow-1000   insulin aspart  0-15 Units Subcutaneous Q4H   lactulose  20 g Oral BID   oseltamivir  75 mg Oral BID   [START ON 09/06/2021] pantoprazole  40 mg Intravenous Q12H   Continuous Infusions:  sodium chloride Stopped (09/03/21 2123)   cefTRIAXone (ROCEPHIN)  IV     octreotide  (SANDOSTATIN)    IV infusion 50 mcg/hr (09/05/21 8338)   pantoprazole 8 mg/hr (09/05/21 0738)     Anti-infectives (From  admission, onward)    Start     Dose/Rate Route Frequency Ordered Stop   09/05/21 2000  cefTRIAXone (ROCEPHIN) 1 g in sodium chloride 0.9 % 100 mL IVPB        1 g 200 mL/hr over 30 Minutes Intravenous Every 24 hours 09/05/21 1031     09/04/21 2000  cefTRIAXone (ROCEPHIN) 2 g in sodium chloride 0.9 % 100 mL IVPB  Status:  Discontinued        2 g 200 mL/hr over 30 Minutes Intravenous Every 24 hours 09/04/21 1101 09/05/21 1031   09/03/21 2200  oseltamivir (TAMIFLU) capsule 75 mg  Status:  Discontinued        75 mg Oral 2 times daily 09/03/21 0429 09/03/21 1150   09/03/21 2000  cefTRIAXone (ROCEPHIN) 2 g in sodium  chloride 0.9 % 100 mL IVPB  Status:  Discontinued        2 g 200 mL/hr over 30 Minutes Intravenous Every 24 hours 09/03/21 0957 09/03/21 1049   09/03/21 2000  cefTRIAXone (ROCEPHIN) 1 g in sodium chloride 0.9 % 100 mL IVPB  Status:  Discontinued        1 g 200 mL/hr over 30 Minutes Intravenous Every 24 hours 09/03/21 1049 09/04/21 1101   09/03/21 1245  oseltamivir (TAMIFLU) capsule 75 mg        75 mg Oral 2 times daily 09/03/21 1150 09/08/21 0959   09/02/21 2330  oseltamivir (TAMIFLU) capsule 75 mg  Status:  Discontinued        75 mg Oral  Once 09/02/21 2326 09/03/21 1149   09/02/21 2000  cefTRIAXone (ROCEPHIN) 2 g in sodium chloride 0.9 % 100 mL IVPB  Status:  Discontinued        2 g 200 mL/hr over 30 Minutes Intravenous Every 24 hours 09/02/21 1958 09/03/21 0659   09/02/21 2000  azithromycin (ZITHROMAX) 500 mg in sodium chloride 0.9 % 250 mL IVPB  Status:  Discontinued        500 mg 250 mL/hr over 60 Minutes Intravenous Every 24 hours 09/02/21 1958 09/03/21 0659              Family Communication/Anticipated D/C date and plan/Code Status   DVT prophylaxis: SCDs Start: 09/03/21 0356     Code Status: Full Code  Family Communication: None Disposition Plan: Possible discharge to home in 2 to 3 days   Status is: Inpatient  Remains inpatient appropriate because: Hypoxic, endoscopic work-up         Subjective:   C/o cough productive of yellowish sputum.  He still feels a little short of breath but overall breathing is better.  He passed yellowish stools this morning when he coughed vigorously.  No bloody stools or hematemesis  Objective:    Vitals:   09/05/21 0743 09/05/21 0747 09/05/21 1041 09/05/21 1048  BP:   (!) 102/59   Pulse:   65   Resp:   20   Temp:  98.2 F (36.8 C) 98.2 F (36.8 C)   TempSrc:  Oral    SpO2: 96%  93% 91%  Weight:      Height:       No data found.   Intake/Output Summary (Last 24 hours) at 09/05/2021 1213 Last data filed at  09/05/2021 8101 Gross per 24 hour  Intake 2287.21 ml  Output 3750 ml  Net -1462.79 ml   Filed Weights   09/02/21 1940 09/04/21 0358 09/05/21 0500  Weight: 94.8 kg 96.4 kg 96.5 kg  Exam:  GEN: NAD SKIN: Warm and dry EYES: EOMI ENT: MMM CV: RRR PULM: Bilateral expiratory wheezing ABD: soft, obese, NT, +BS CNS: AAO x 3, non focal EXT: No edema or tenderness       Data Reviewed:   I have personally reviewed following labs and imaging studies:  Labs: Labs show the following:   Basic Metabolic Panel: Recent Labs  Lab 09/02/21 2015 09/03/21 0427 09/04/21 0434 09/05/21 0415  NA 135 139 137 135  K 5.5* 5.1 3.9 3.8  CL 99 108 104 98  CO2 26 24 30  32  GLUCOSE 315* 277* 201* 158*  BUN 52* 52* 18 9  CREATININE 1.17 0.90 0.68 0.72  CALCIUM 8.6* 7.9* 7.5* 7.6*  MG  --  1.6* 1.7  --   PHOS  --  3.9  --   --    GFR Estimated Creatinine Clearance: 110.1 mL/min (by C-G formula based on SCr of 0.72 mg/dL). Liver Function Tests: Recent Labs  Lab 09/02/21 2015 09/03/21 0427 09/04/21 0434  AST 36 28 34  ALT 39 31 29  ALKPHOS 103 80 73  BILITOT 1.3* 1.3* 0.4  PROT 6.6 5.5* 5.2*  ALBUMIN 3.4* 2.9* 2.8*   No results for input(s): LIPASE, AMYLASE in the last 168 hours. Recent Labs  Lab 09/03/21 0030  AMMONIA 75*   Coagulation profile Recent Labs  Lab 09/02/21 2015 09/03/21 0545 09/04/21 1228  INR 1.2 1.1 1.0    CBC: Recent Labs  Lab 09/02/21 2015 09/02/21 2353 09/03/21 0427 09/03/21 0545 09/04/21 0434 09/04/21 1228 09/04/21 1508 09/05/21 0415 09/05/21 0725  WBC 10.2  --  8.7  --  8.3  --   --  4.9  --   NEUTROABS  --   --   --   --   --   --   --  3.1  --   HGB 12.9*   < > 11.3*   < > 8.8* 8.7* 9.4* 8.2* 8.4*  HCT 40.6   < > 34.6*   < > 27.4* 26.9* 29.8* 25.1* 25.8*  MCV 93.1  --  93.0  --  96.5  --   --  95.4  --   PLT 200  --  168  --  131*  --   --  142*  --    < > = values in this interval not displayed.   Cardiac Enzymes: No results for  input(s): CKTOTAL, CKMB, CKMBINDEX, TROPONINI in the last 168 hours. BNP (last 3 results) No results for input(s): PROBNP in the last 8760 hours. CBG: Recent Labs  Lab 09/04/21 1946 09/05/21 0024 09/05/21 0433 09/05/21 0731 09/05/21 1123  GLUCAP 160* 117* 168* 125* 138*   D-Dimer: No results for input(s): DDIMER in the last 72 hours. Hgb A1c: Recent Labs    09/03/21 0437  HGBA1C 6.8*   Lipid Profile: No results for input(s): CHOL, HDL, LDLCALC, TRIG, CHOLHDL, LDLDIRECT in the last 72 hours. Thyroid function studies: No results for input(s): TSH, T4TOTAL, T3FREE, THYROIDAB in the last 72 hours.  Invalid input(s): FREET3 Anemia work up: No results for input(s): VITAMINB12, FOLATE, FERRITIN, TIBC, IRON, RETICCTPCT in the last 72 hours. Sepsis Labs: Recent Labs  Lab 09/02/21 2015 09/02/21 2353 09/03/21 0427 09/03/21 0721 09/04/21 0434 09/05/21 0415  PROCALCITON  --   --  0.19  --   --   --   WBC 10.2  --  8.7  --  8.3 4.9  LATICACIDVEN 3.2* 3.0*  --  1.5  --   --  Microbiology Recent Results (from the past 240 hour(s))  Blood culture (routine x 2)     Status: None (Preliminary result)   Collection Time: 09/02/21  8:15 PM   Specimen: BLOOD LEFT HAND  Result Value Ref Range Status   Specimen Description BLOOD LEFT HAND  Final   Special Requests   Final    BOTTLES DRAWN AEROBIC AND ANAEROBIC Blood Culture adequate volume   Culture   Final    NO GROWTH 3 DAYS Performed at [facility], [address], [place], [place] [address]    Report Status PENDING  Incomplete  Blood culture (routine x 2)     Status: None (Preliminary result)   Collection Time: 09/02/21  8:15 PM   Specimen: BLOOD RIGHT HAND  Result Value Ref Range Status   Specimen Description BLOOD RIGHT HAND  Final   Special Requests   Final    BOTTLES DRAWN AEROBIC AND ANAEROBIC Blood Culture adequate volume   Culture   Final    NO GROWTH 3 DAYS Performed at [facility], [address]  [place], [place] [address]    Report Status PENDING  Incomplete  Resp Panel by RT-PCR (Flu A&B, Covid) Nasopharyngeal Swab     Status: Abnormal   Collection Time: 09/02/21  8:40 PM   Specimen: Nasopharyngeal Swab; Nasopharyngeal(NP) swabs in vial transport medium  Result Value Ref Range Status   SARS Coronavirus 2 by RT PCR NEGATIVE NEGATIVE Final    Comment: (NOTE) SARS-CoV-2 target nucleic acids are NOT DETECTED.  The SARS-CoV-2 RNA is generally detectable in upper respiratory specimens during the acute phase of infection. The lowest concentration of SARS-CoV-2 viral copies this assay can detect is 138 copies/mL. A negative result does not preclude SARS-Cov-2 infection and should not be used as the sole basis for treatment or other patient management decisions. A negative result may occur with  improper specimen collection/handling, submission of specimen other than nasopharyngeal swab, presence of viral mutation(s) within the areas targeted by this assay, and inadequate number of viral copies(<138 copies/mL). A negative result must be combined with clinical observations, patient history, and epidemiological information. The expected result is Negative.  Fact Sheet for Patients:  EntrepreneurPulse.com.au  Fact Sheet for Healthcare Providers:  IncredibleEmployment.be  This test is no t yet approved or cleared by the [place] FDA and  has been authorized for detection and/or diagnosis of SARS-CoV-2 by FDA under an Emergency Use Authorization (EUA). This EUA will remain  in effect (meaning this test can be used) for the duration of the COVID-19 declaration under Section 564(b)(1) of the Act, 21 U.S.C.section 360bbb-3(b)(1), unless the authorization is terminated  or revoked sooner.       Influenza A by PCR POSITIVE (A) NEGATIVE Final   Influenza B by PCR NEGATIVE NEGATIVE Final    Comment: (NOTE) The Xpert Xpress SARS-CoV-2/FLU/RSV plus  assay is intended as an aid in the diagnosis of influenza from Nasopharyngeal swab specimens and should not be used as a sole basis for treatment. Nasal washings and aspirates are unacceptable for Xpert Xpress SARS-CoV-2/FLU/RSV testing.  Fact Sheet for Patients: EntrepreneurPulse.com.au  Fact Sheet for Healthcare Providers: IncredibleEmployment.be  This test is not yet approved or cleared by the [place] FDA and has been authorized for detection and/or diagnosis of SARS-CoV-2 by FDA under an Emergency Use Authorization (EUA). This EUA will remain in effect (meaning this test can be used) for the duration of the COVID-19 declaration under Section 564(b)(1) of the Act, 21 U.S.C.  section 360bbb-3(b)(1), unless the authorization is terminated or revoked.  Performed at [facility], [address] [place], [place] [address]   MRSA Next Gen by PCR, Nasal     Status: None   Collection Time: 09/03/21  4:19 PM   Specimen: Nasal Mucosa; Nasal Swab  Result Value Ref Range Status   MRSA by PCR Next Gen NOT DETECTED NOT DETECTED Final    Comment: (NOTE) The GeneXpert MRSA Assay (FDA approved for NASAL specimens only), is one component of a comprehensive MRSA colonization surveillance program. It is not intended to diagnose MRSA infection nor to guide or monitor treatment for MRSA infections. Test performance is not FDA approved in patients less than 68 years old. Performed at [facility], [address] [place], [place] [address]     Procedures and diagnostic studies:  DG CHEST PORT 1 VIEW  Result Date: 09/04/2021 CLINICAL DATA:  Shortness of breath EXAM: PORTABLE CHEST 1 VIEW COMPARISON:  09/02/2021 FINDINGS: 0738 hours. The cardio pericardial silhouette is enlarged. There is pulmonary vascular congestion without overt pulmonary edema. Similar appearance of probable basilar atelectasis bilaterally. The visualized bony structures of the thorax show  no acute abnormality. Telemetry leads overlie the chest. IMPRESSION: 1. Enlarged cardiac silhouette with pulmonary vascular congestion. 2. Stable basilar atelectasis. Electronically Signed   By: Misty Stanley M.D.   On: 09/04/2021 08:01   CT ANGIO GI BLEED  Result Date: 09/03/2021 CLINICAL DATA:  Gross hematochezia with abdominal discomfort. EXAM: CTA ABDOMEN AND PELVIS WITHOUT AND WITH CONTRAST TECHNIQUE: Multidetector CT imaging of the abdomen and pelvis was performed using the standard protocol during bolus administration of intravenous contrast. Multiplanar reconstructed images and MIPs were obtained and reviewed to evaluate the vascular anatomy. CONTRAST:  193mL OMNIPAQUE IOHEXOL 350 MG/ML SOLN COMPARISON:  Prior CT scan of the abdomen 03/09/2019 FINDINGS: VASCULAR Aorta: Normal in caliber. No evidence of aneurysm. Scattered atherosclerotic plaque. Celiac: Patent without evidence of aneurysm, dissection, vasculitis or significant stenosis. SMA: Patent without evidence of aneurysm, dissection, vasculitis or significant stenosis. Renals: Both main renal arteries are patent without evidence of aneurysm, dissection, vasculitis, fibromuscular dysplasia or significant stenosis. Small accessory renal artery to the right lower pole. IMA: Patent without evidence of aneurysm, dissection, vasculitis or significant stenosis. Inflow: Patent without evidence of aneurysm, dissection, vasculitis or significant stenosis. Proximal Outflow: Bilateral common femoral and visualized portions of the superficial and profunda femoral arteries are patent without evidence of aneurysm, dissection, vasculitis or significant stenosis. Veins: No focal venous abnormality. Review of the MIP images confirms the above findings. NON-VASCULAR Lower chest: Respiratory motion artifact. Trace dependent atelectasis. No acute abnormality. Hepatobiliary: Nodular liver contour with relative hypertrophy of the left hepatic lobe and atrophy of the right  hepatic lobe. Morphologic changes are consistent with hepatic cirrhosis. No arterially enhancing lesion. Gallbladder is unremarkable. No intra or extrahepatic biliary ductal dilatation. Pancreas: Unremarkable. No pancreatic ductal dilatation or surrounding inflammatory changes. Spleen: No splenic injury or perisplenic hematoma. Adrenals/Urinary Tract: Adrenal glands are unremarkable. Kidneys are normal, without renal calculi, focal lesion, or hydronephrosis. Bladder is unremarkable. Stomach/Bowel: Colonic diverticular disease without CT evidence of active inflammation. No evidence of extravasation of contrast material to suggest a source for acute bleeding. No evidence of focal bowel wall thickening or obstruction. Normal appendix. Lymphatic: No suspicious lymphadenopathy. Reproductive: Prostate is unremarkable. Other: No abdominal wall hernia or abnormality. No abdominopelvic ascites. Musculoskeletal: No acute fracture, malalignment or osseous lesion. Multilevel degenerative disc disease and lower lumbar facet arthropathy. IMPRESSION: VASCULAR 1.  No evidence of active arterial bleeding at this time. 2. Scattered atherosclerotic plaque without evidence of stenosis, dissection or aneurysm. Aortic Atherosclerosis (ICD10-I70.0). NON-VASCULAR 1. Colonic diverticular disease without CT evidence of active inflammation or bleeding at this time. 2. Hepatic cirrhosis. 3. Multilevel degenerative disc disease and lower lumbar facet arthropathy. Electronically Signed   By: Jacqulynn Cadet M.D.   On: 09/03/2021 16:54               LOS: 2 days   Nanea Jared  [facility]   Pager on [email or web address]. If 7PM-7AM, please contact night-coverage at [email or web address]     09/05/2021, 12:13 PM

## 2021-09-05 NOTE — Progress Notes (Signed)
Pt refusing to wear BIPAP machine tonight. Informed patient if he changes his mind to be sure to let me know . Machine at bedside. Nurse informed

## 2021-09-06 ENCOUNTER — Inpatient Hospital Stay (HOSPITAL_COMMUNITY): Payer: Medicare Other | Admitting: Anesthesiology

## 2021-09-06 ENCOUNTER — Encounter (HOSPITAL_COMMUNITY): Payer: Self-pay | Admitting: Internal Medicine

## 2021-09-06 ENCOUNTER — Encounter (HOSPITAL_COMMUNITY)
Admission: EM | Disposition: A | Discharge status: Home / Self Care | Payer: Self-pay | Source: Home / Self Care | Attending: Internal Medicine

## 2021-09-06 DIAGNOSIS — I8500 Esophageal varices without bleeding: Secondary | ICD-10-CM

## 2021-09-06 DIAGNOSIS — K766 Portal hypertension: Secondary | ICD-10-CM

## 2021-09-06 DIAGNOSIS — K3189 Other diseases of stomach and duodenum: Secondary | ICD-10-CM

## 2021-09-06 DIAGNOSIS — K921 Melena: Secondary | ICD-10-CM

## 2021-09-06 DIAGNOSIS — K449 Diaphragmatic hernia without obstruction or gangrene: Secondary | ICD-10-CM

## 2021-09-06 HISTORY — PX: ESOPHAGOGASTRODUODENOSCOPY (EGD) WITH PROPOFOL: SHX5813

## 2021-09-06 LAB — GLUCOSE, CAPILLARY
Glucose-Capillary: 150 mg/dL — ABNORMAL HIGH (ref 70–99)
Glucose-Capillary: 137 mg/dL — ABNORMAL HIGH (ref 70–99)
Glucose-Capillary: 125 mg/dL — ABNORMAL HIGH (ref 70–99)

## 2021-09-06 LAB — CBC
HCT: 27.1 % — ABNORMAL LOW (ref 39.0–52.0)
Hemoglobin: 8.7 g/dL — ABNORMAL LOW (ref 13.0–17.0)
MCH: 30.4 pg (ref 26.0–34.0)
MCHC: 32.1 g/dL (ref 30.0–36.0)
MCV: 94.8 fL (ref 80.0–100.0)
Platelets: 147 10*3/uL — ABNORMAL LOW (ref 150–400)
RBC: 2.86 MIL/uL — ABNORMAL LOW (ref 4.22–5.81)
RDW: 14.5 % (ref 11.5–15.5)
WBC: 4.8 10*3/uL (ref 4.0–10.5)
nRBC: 0 % (ref 0.0–0.2)

## 2021-09-06 LAB — COMPREHENSIVE METABOLIC PANEL
ALT: 29 U/L (ref 0–44)
AST: 41 U/L (ref 15–41)
Albumin: 3 g/dL — ABNORMAL LOW (ref 3.5–5.0)
Alkaline Phosphatase: 77 U/L (ref 38–126)
Anion gap: 7 (ref 5–15)
BUN: 5 mg/dL — ABNORMAL LOW (ref 6–20)
CO2: 32 mmol/L (ref 22–32)
Calcium: 8.2 mg/dL — ABNORMAL LOW (ref 8.9–10.3)
Chloride: 100 mmol/L (ref 98–111)
Creatinine, Ser: 0.64 mg/dL (ref 0.61–1.24)
GFR, Estimated: >60 mL/min (ref >60)
Glucose, Bld: 140 mg/dL — ABNORMAL HIGH (ref 70–99)
Potassium: 3.7 mmol/L (ref 3.5–5.1)
Sodium: 139 mmol/L (ref 135–145)
Total Bilirubin: 0.4 mg/dL (ref 0.3–1.2)
Total Protein: 5.7 g/dL — ABNORMAL LOW (ref 6.5–8.1)

## 2021-09-06 LAB — PROTIME-INR
INR: 1 (ref 0.8–1.2)
Prothrombin Time: 13.5 seconds (ref 11.4–15.2)

## 2021-09-06 LAB — HEMOGLOBIN AND HEMATOCRIT, BLOOD
HCT: 27.3 % — ABNORMAL LOW (ref 39.0–52.0)
HCT: 27.1 % — ABNORMAL LOW (ref 39.0–52.0)
Hemoglobin: 8.7 g/dL — ABNORMAL LOW (ref 13.0–17.0)
Hemoglobin: 8.8 g/dL — ABNORMAL LOW (ref 13.0–17.0)

## 2021-09-06 LAB — MAGNESIUM: Magnesium: 1.7 mg/dL (ref 1.7–2.4)

## 2021-09-06 SURGERY — ESOPHAGOGASTRODUODENOSCOPY (EGD) WITH PROPOFOL
Anesthesia: General

## 2021-09-06 MED ORDER — CIPROFLOXACIN HCL 500 MG PO TABS
500.0000 mg | ORAL_TABLET | Freq: Two times a day (BID) | ORAL | 0 refills | Status: AC
Start: 2021-09-06 — End: 2021-09-08

## 2021-09-06 MED ORDER — ACETAMINOPHEN 500 MG PO TABS: 1000.0000 mg | ORAL_TABLET | Freq: Three times a day (TID) | ORAL | Status: AC | PRN

## 2021-09-06 MED ORDER — LACTATED RINGERS IV SOLN: INTRAVENOUS | Status: DC | PRN

## 2021-09-06 MED ORDER — OSELTAMIVIR PHOSPHATE 75 MG PO CAPS: 75.0000 mg | ORAL_CAPSULE | Freq: Two times a day (BID) | ORAL | 0 refills | Status: AC

## 2021-09-06 MED ORDER — LACTULOSE 10 GM/15ML PO SOLN: 20.0000 g | Freq: Two times a day (BID) | ORAL | 0 refills | Status: AC

## 2021-09-06 MED ORDER — PANTOPRAZOLE SODIUM 40 MG PO TBEC
40.0000 mg | DELAYED_RELEASE_TABLET | Freq: Every day | ORAL | Status: DC
  Filled 2021-09-06: qty 1

## 2021-09-06 MED ORDER — KETAMINE HCL 10 MG/ML IJ SOLN
INTRAMUSCULAR | Status: DC | PRN
  Administered 2021-09-06: 30 mg via INTRAVENOUS
  Administered 2021-09-06: 20 mg via INTRAVENOUS

## 2021-09-06 MED ORDER — KETAMINE HCL 50 MG/5ML IJ SOSY
PREFILLED_SYRINGE | INTRAMUSCULAR | Status: AC
  Filled 2021-09-06: qty 5

## 2021-09-06 MED ORDER — SODIUM CHLORIDE 0.9 % IV SOLN: INTRAVENOUS | Status: DC

## 2021-09-06 MED ORDER — LACTATED RINGERS IV SOLN: INTRAVENOUS | Status: DC

## 2021-09-06 MED ORDER — PROPOFOL 500 MG/50ML IV EMUL
INTRAVENOUS | Status: DC | PRN
  Administered 2021-09-06: 20 mg via INTRAVENOUS

## 2021-09-06 NOTE — Progress Notes (Signed)
We will proceed with EGD as scheduled.  I thoroughly discussed with the patient his procedure, including the risks involved. Patient understands what the procedure involves including the benefits and any risks. Patient understands alternatives to the proposed procedure. Risks including (but not limited to) bleeding, tearing of the lining (perforation), rupture of adjacent organs, problems with heart and lung function, infection, and medication reactions. A small percentage of complications may require surgery, hospitalization, repeat endoscopic procedure, and/or transfusion.  Patient understood and agreed.  Eryc Bodey Castaneda, MD Gastroenterology and Hepatology [facility]  

## 2021-09-06 NOTE — Brief Op Note (Signed)
09/02/2021 - 09/06/2021  10:40 AM  PATIENT:  Randy Hebert  56 y.o. male  PRE-OPERATIVE DIAGNOSIS:  gastrointestinal bleeding  POST-OPERATIVE DIAGNOSIS:  grade1 varicies, hiatal hernia, portal hyertension gastropathy  PROCEDURE:  Procedure(s): ESOPHAGOGASTRODUODENOSCOPY (EGD) WITH PROPOFOL (N/A)  SURGEON:  Surgeon(s) and Role:    * Harvel Quale, MD - Primary  Patient underwent EGD under propofol sedation.  Tolerated the procedure adequately.  Patient was found to have three columns of grade I varices were found in the distal esophagus. No stigmata of recent bleeding or red wale signs were found.  There was presence of a small hiatal hernia.  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  Normal duodenum.  No evidence of hematin or stigmata of recent bleeding.  RECOMMENDATIONS: - Return patient to hospital ward for ongoing care.  - Full liquid diet today, advance to low sodium diet tomorrow if tolerating.  - Check H/H daily. - Stop octreotide and switch to pantoprazole 40 mg qday. - Unless patient presents recurrent episodes of rectal bleeding, will set up for outpatient colonoscopy. - Repeat upper endoscopy in 1 year for surveillance.  - Strict alcohol cessation.  Maylon Peppers, MD Gastroenterology and Hepatology [facility]

## 2021-09-06 NOTE — Progress Notes (Signed)
SATURATION QUALIFICATIONS: (This note is used to comply with regulatory documentation for home oxygen)  Patient Saturations on Room Air at Rest = 93%  Patient Saturations on Room Air while Ambulating = 85%  Patient Saturations on 4 Liters of oxygen while Ambulating = 96%

## 2021-09-06 NOTE — Progress Notes (Deleted)
Pt is receiving  4L of o2  at this time. When on 4lL of o2 Sat are 96%. Tried pt on room air, o2 sat dropped to 93%. When pt walked in the hallway without o2 pt sat dropped to 85%.

## 2021-09-06 NOTE — Anesthesia Postprocedure Evaluation (Signed)
Anesthesia Post Note  Patient: Randy Hebert  Procedure(s) Performed: ESOPHAGOGASTRODUODENOSCOPY (EGD) WITH PROPOFOL  Patient location during evaluation: Phase II Anesthesia Type: General Level of consciousness: awake Pain management: pain level controlled Vital Signs Assessment: post-procedure vital signs reviewed and stable Respiratory status: spontaneous breathing and respiratory function stable Cardiovascular status: blood pressure returned to baseline and stable Postop Assessment: no headache and no apparent nausea or vomiting Anesthetic complications: no Comments: Late entry   No notable events documented.   Last Vitals:  Vitals:   09/06/21 1035 09/06/21 1043  BP: 105/66 107/60  Pulse: 82 78  Resp: 19 20  Temp: 36.6 C   SpO2: 91% 93%    Last Pain:  Vitals:   09/06/21 1043  TempSrc:   PainSc: 0-No pain                 Louann Sjogren

## 2021-09-06 NOTE — Discharge Summary (Addendum)
Physician Discharge Summary  Randy Hebert DVV:[id number] DOB: 07/22/1966 DOA: 09/02/2021  PCP: Lindell Spar, MD  Admit date: 09/02/2021 Discharge date: 09/06/2021  Discharge disposition: Home   Recommendations for Outpatient Follow-Up:   Follow-up With PCP in 1 week  Follow-up with gastroenterologist within 1 month of discharge to arrange for colonoscopy and surveillance EGD.  Discharge Diagnosis:   Principal Problem:   Upper GI bleed Active Problems:   Essential hypertension   Chronic systolic CHF (congestive heart failure) (HCC)   COPD (chronic obstructive pulmonary disease) (HCC)   Mixed hyperlipidemia   OSA (obstructive sleep apnea)   Hyperglycemia due to diabetes mellitus (HCC)   Acute respiratory failure with hypoxia (HCC)   Diarrhea   Cirrhosis of liver (HCC)   Influenza A   Hypoalbuminemia due to protein-calorie malnutrition (HCC)   Lactic acidosis   Elevated troponin   Obesity (BMI 30.0-34.9)   Hyperkalemia   Hyperammonemia ([place])    Discharge Condition: Stable.  Diet recommendation:  Diet Order             Diet full liquid Room service appropriate? Yes; Fluid consistency: Thin  Diet effective now           Diet - low sodium heart healthy           Diet Carb Modified                     Code Status: Full Code     Hospital Course:   Randy Hebert is a 55 y.o. male with medical history significant for essential hypertension, T2DM, CAD, STEMI (2016), NSTEMI (2018), CHF, hyperlipidemia, COPD, liver cirrhosis, who presented to the hospital with pleuritic chest pain, cough, fever, nausea, vomiting, diarrhea, hematemesis and bloody stools.   He was found to have acute hypoxemic respiratory failure, influenza A infection and acute GI bleeding.  He was treated with Tamiflu, oxygen via nasal cannula, IV Protonix drip, IV octreotide drip and empiric IV antibiotics.  He had acute blood loss anemia but he did not require transfusion.  He also  had hypokalemia and hypomagnesemia that improved with treatment.  He was treated with lactulose because of elevated ammonia levels.  He underwent EGD which showed grade 1 esophageal varices, small hiatal hernia, portal hypertensive gastropathy and normal examined duodenum.  He could not be weaned off of oxygen.  However, he said he felt better and insisted on being discharged home.  He was discharged on 4 L/min oxygen via nasal cannula for chronic hypoxemic respiratory failure likely from his COPD.  He has been advised to avoid alcohol and cigarette smoking.   Medical Consultants:   Gastroenterologist   Discharge Exam:    Vitals:   09/06/21 1043 09/06/21 1318 09/06/21 1319 09/06/21 1425  BP: 107/60   118/68  Pulse: 78   68  Resp: 20   18  Temp:    97.9 F (36.6 C)  TempSrc:      SpO2: 93% (!) 84% 92% 96%  Weight:      Height:         GEN: NAD SKIN: Warm and dry EYES: No pallor or icterus ENT: MMM CV: RRR PULM: CTA B ABD: soft, obese, NT, +BS CNS: AAO x 3, non focal EXT: No edema or tenderness    The results of significant diagnostics from this hospitalization (including imaging, microbiology, ancillary and laboratory) are listed below for reference.     Procedures and Diagnostic Studies:  DG Chest 2 View  Result Date: 09/02/2021 CLINICAL DATA:  Shortness of breath and chest pain.  Cough.  Fever. EXAM: CHEST - 2 VIEW COMPARISON:  05/13/2021 trauma CT 01/25/2021 FINDINGS: Prominent heart size may be accentuated by portable technique. Right-sided aortic arch. Implanted loop recorder in the left chest wall. Mild diffuse peribronchial thickening. Subsegmental opacities in the bases. No large pleural effusion. No pneumothorax. Thoracic spondylosis. IMPRESSION: 1. Cardiomegaly. Peribronchial thickening may be bronchitis or congestive. 2. Subsegmental opacities at the bases favor atelectasis or scarring. Electronically Signed   By: Keith Rake M.D.   On: 09/02/2021 20:39    CT ANGIO GI BLEED  Result Date: 09/03/2021 CLINICAL DATA:  Gross hematochezia with abdominal discomfort. EXAM: CTA ABDOMEN AND PELVIS WITHOUT AND WITH CONTRAST TECHNIQUE: Multidetector CT imaging of the abdomen and pelvis was performed using the standard protocol during bolus administration of intravenous contrast. Multiplanar reconstructed images and MIPs were obtained and reviewed to evaluate the vascular anatomy. CONTRAST:  166mL OMNIPAQUE IOHEXOL 350 MG/ML SOLN COMPARISON:  Prior CT scan of the abdomen 03/09/2019 FINDINGS: VASCULAR Aorta: Normal in caliber. No evidence of aneurysm. Scattered atherosclerotic plaque. Celiac: Patent without evidence of aneurysm, dissection, vasculitis or significant stenosis. SMA: Patent without evidence of aneurysm, dissection, vasculitis or significant stenosis. Renals: Both main renal arteries are patent without evidence of aneurysm, dissection, vasculitis, fibromuscular dysplasia or significant stenosis. Small accessory renal artery to the right lower pole. IMA: Patent without evidence of aneurysm, dissection, vasculitis or significant stenosis. Inflow: Patent without evidence of aneurysm, dissection, vasculitis or significant stenosis. Proximal Outflow: Bilateral common femoral and visualized portions of the superficial and profunda femoral arteries are patent without evidence of aneurysm, dissection, vasculitis or significant stenosis. Veins: No focal venous abnormality. Review of the MIP images confirms the above findings. NON-VASCULAR Lower chest: Respiratory motion artifact. Trace dependent atelectasis. No acute abnormality. Hepatobiliary: Nodular liver contour with relative hypertrophy of the left hepatic lobe and atrophy of the right hepatic lobe. Morphologic changes are consistent with hepatic cirrhosis. No arterially enhancing lesion. Gallbladder is unremarkable. No intra or extrahepatic biliary ductal dilatation. Pancreas: Unremarkable. No pancreatic ductal  dilatation or surrounding inflammatory changes. Spleen: No splenic injury or perisplenic hematoma. Adrenals/Urinary Tract: Adrenal glands are unremarkable. Kidneys are normal, without renal calculi, focal lesion, or hydronephrosis. Bladder is unremarkable. Stomach/Bowel: Colonic diverticular disease without CT evidence of active inflammation. No evidence of extravasation of contrast material to suggest a source for acute bleeding. No evidence of focal bowel wall thickening or obstruction. Normal appendix. Lymphatic: No suspicious lymphadenopathy. Reproductive: Prostate is unremarkable. Other: No abdominal wall hernia or abnormality. No abdominopelvic ascites. Musculoskeletal: No acute fracture, malalignment or osseous lesion. Multilevel degenerative disc disease and lower lumbar facet arthropathy. IMPRESSION: VASCULAR 1. No evidence of active arterial bleeding at this time. 2. Scattered atherosclerotic plaque without evidence of stenosis, dissection or aneurysm. Aortic Atherosclerosis (ICD10-I70.0). NON-VASCULAR 1. Colonic diverticular disease without CT evidence of active inflammation or bleeding at this time. 2. Hepatic cirrhosis. 3. Multilevel degenerative disc disease and lower lumbar facet arthropathy. Electronically Signed   By: Jacqulynn Cadet M.D.   On: 09/03/2021 16:54     Labs:   Basic Metabolic Panel: Recent Labs  Lab 09/02/21 2015 09/03/21 0427 09/04/21 0434 09/05/21 0415 09/06/21 0704  NA 135 139 137 135 139  K 5.5* 5.1 3.9 3.8 3.7  CL 99 108 104 98 100  CO2 26 24 30  32 32  GLUCOSE 315* 277* 201* 158* 140*  BUN 52* 52* 18  9 5*  CREATININE 1.17 0.90 0.68 0.72 0.64  CALCIUM 8.6* 7.9* 7.5* 7.6* 8.2*  MG  --  1.6* 1.7  --  1.7  PHOS  --  3.9  --   --   --    GFR Estimated Creatinine Clearance: 110.1 mL/min (by C-G formula based on SCr of 0.64 mg/dL). Liver Function Tests: Recent Labs  Lab 09/02/21 2015 09/03/21 0427 09/04/21 0434 09/06/21 0704  AST 36 28 34 41  ALT 39 31  29 29   ALKPHOS 103 80 73 77  BILITOT 1.3* 1.3* 0.4 0.4  PROT 6.6 5.5* 5.2* 5.7*  ALBUMIN 3.4* 2.9* 2.8* 3.0*   No results for input(s): LIPASE, AMYLASE in the last 168 hours. Recent Labs  Lab 09/03/21 0030  AMMONIA 75*   Coagulation profile Recent Labs  Lab 09/02/21 2015 09/03/21 0545 09/04/21 1228 09/06/21 0704  INR 1.2 1.1 1.0 1.0    CBC: Recent Labs  Lab 09/02/21 2015 09/02/21 2353 09/03/21 0427 09/03/21 0545 09/04/21 0434 09/04/21 1228 09/05/21 0415 09/05/21 0725 09/05/21 1526 09/05/21 2321 09/06/21 0704  WBC 10.2  --  8.7  --  8.3  --  4.9  --   --   --  4.8  NEUTROABS  --   --   --   --   --   --  3.1  --   --   --   --   HGB 12.9*   < > 11.3*   < > 8.8*   < > 8.2* 8.4* 8.5* 8.6* 8.7*   8.7*  HCT 40.6   < > 34.6*   < > 27.4*   < > 25.1* 25.8* 26.0* 26.1* 27.3*   27.1*  MCV 93.1  --  93.0  --  96.5  --  95.4  --   --   --  94.8  PLT 200  --  168  --  131*  --  142*  --   --   --  147*   < > = values in this interval not displayed.   Cardiac Enzymes: No results for input(s): CKTOTAL, CKMB, CKMBINDEX, TROPONINI in the last 168 hours. BNP: Invalid input(s): POCBNP CBG: Recent Labs  Lab 09/05/21 1635 09/05/21 2129 09/06/21 0752 09/06/21 0934 09/06/21 1123  GLUCAP 115* 157* 150* 137* 125*   D-Dimer No results for input(s): DDIMER in the last 72 hours. Hgb A1c No results for input(s): HGBA1C in the last 72 hours. Lipid Profile No results for input(s): CHOL, HDL, LDLCALC, TRIG, CHOLHDL, LDLDIRECT in the last 72 hours. Thyroid function studies No results for input(s): TSH, T4TOTAL, T3FREE, THYROIDAB in the last 72 hours.  Invalid input(s): FREET3 Anemia work up No results for input(s): VITAMINB12, FOLATE, FERRITIN, TIBC, IRON, RETICCTPCT in the last 72 hours. Microbiology Recent Results (from the past 240 hour(s))  Blood culture (routine x 2)     Status: None (Preliminary result)   Collection Time: 09/02/21  8:15 PM   Specimen: BLOOD LEFT HAND   Result Value Ref Range Status   Specimen Description BLOOD LEFT HAND  Final   Special Requests   Final    BOTTLES DRAWN AEROBIC AND ANAEROBIC Blood Culture adequate volume   Culture   Final    NO GROWTH 4 DAYS Performed at [facility], [address], [place], [place] [address]    Report Status PENDING  Incomplete  Blood culture (routine x 2)     Status: None (Preliminary result)   Collection Time: 09/02/21  8:15 PM   Specimen: BLOOD RIGHT HAND  Result Value Ref Range Status   Specimen Description BLOOD RIGHT HAND  Final   Special Requests   Final    BOTTLES DRAWN AEROBIC AND ANAEROBIC Blood Culture adequate volume   Culture   Final    NO GROWTH 4 DAYS Performed at [facility], [address] [place], [place] [address]    Report Status PENDING  Incomplete  Resp Panel by RT-PCR (Flu A&B, Covid) Nasopharyngeal Swab     Status: Abnormal   Collection Time: 09/02/21  8:40 PM   Specimen: Nasopharyngeal Swab; Nasopharyngeal(NP) swabs in vial transport medium  Result Value Ref Range Status   SARS Coronavirus 2 by RT PCR NEGATIVE NEGATIVE Final    Comment: (NOTE) SARS-CoV-2 target nucleic acids are NOT DETECTED.  The SARS-CoV-2 RNA is generally detectable in upper respiratory specimens during the acute phase of infection. The lowest concentration of SARS-CoV-2 viral copies this assay can detect is 138 copies/mL. A negative result does not preclude SARS-Cov-2 infection and should not be used as the sole basis for treatment or other patient management decisions. A negative result may occur with  improper specimen collection/handling, submission of specimen other than nasopharyngeal swab, presence of viral mutation(s) within the areas targeted by this assay, and inadequate number of viral copies(<138 copies/mL). A negative result must be combined with clinical observations, patient history, and epidemiological information. The expected result is Negative.  Fact Sheet for Patients:   EntrepreneurPulse.com.au  Fact Sheet for Healthcare Providers:  IncredibleEmployment.be  This test is no t yet approved or cleared by the [place] FDA and  has been authorized for detection and/or diagnosis of SARS-CoV-2 by FDA under an Emergency Use Authorization (EUA). This EUA will remain  in effect (meaning this test can be used) for the duration of the COVID-19 declaration under Section 564(b)(1) of the Act, 21 U.S.C.section 360bbb-3(b)(1), unless the authorization is terminated  or revoked sooner.       Influenza A by PCR POSITIVE (A) NEGATIVE Final   Influenza B by PCR NEGATIVE NEGATIVE Final    Comment: (NOTE) The Xpert Xpress SARS-CoV-2/FLU/RSV plus assay is intended as an aid in the diagnosis of influenza from Nasopharyngeal swab specimens and should not be used as a sole basis for treatment. Nasal washings and aspirates are unacceptable for Xpert Xpress SARS-CoV-2/FLU/RSV testing.  Fact Sheet for Patients: EntrepreneurPulse.com.au  Fact Sheet for Healthcare Providers: IncredibleEmployment.be  This test is not yet approved or cleared by the [place] FDA and has been authorized for detection and/or diagnosis of SARS-CoV-2 by FDA under an Emergency Use Authorization (EUA). This EUA will remain in effect (meaning this test can be used) for the duration of the COVID-19 declaration under Section 564(b)(1) of the Act, 21 U.S.C. section 360bbb-3(b)(1), unless the authorization is terminated or revoked.  Performed at [facility], [address] [place], [place] [address]   MRSA Next Gen by PCR, Nasal     Status: None   Collection Time: 09/03/21  4:19 PM   Specimen: Nasal Mucosa; Nasal Swab  Result Value Ref Range Status   MRSA by PCR Next Gen NOT DETECTED NOT DETECTED Final    Comment: (NOTE) The GeneXpert MRSA Assay (FDA approved for NASAL specimens only), is one component of a  comprehensive MRSA colonization surveillance program. It is not intended to diagnose MRSA infection nor to guide or monitor treatment for MRSA infections. Test performance is not FDA approved in patients less than 2 years  old. Performed at [facility], [address], [place], [place] [address]   Urine Culture     Status: None   Collection Time: 09/04/21  9:24 AM   Specimen: Urine, Catheterized  Result Value Ref Range Status   Specimen Description   Final    URINE, CATHETERIZED Performed at [facility], [address] [place], [place] [address]    Special Requests   Final    NONE Performed at [facility], [address] [place], [place] [address]    Culture   Final    NO GROWTH Performed at [facility], [place] [address] [place], [place] [address]    Report Status 09/05/2021 FINAL  Final  Expectorated Sputum Assessment w Gram Stain, Rflx to Resp Cult     Status: None   Collection Time: 09/05/21  4:47 AM   Specimen: Sputum  Result Value Ref Range Status   Specimen Description SPUTUM  Final   Special Requests NONE  Final   Sputum evaluation   Final    Sputum specimen not acceptable for testing.  Please recollect.   [place] ON 09/05/21, BROWNING, D. Performed at [facility], [address] [place], [place] [address]    Report Status 09/05/2021 FINAL  Final     Discharge Instructions:   Discharge Instructions     Diet - low sodium heart healthy   Complete by: As directed    Diet Carb Modified   Complete by: As directed    Discharge instructions   Complete by: As directed    Follow-up with gastroenterologist within 2 to 3 weeks to arrange for colonoscopy.   Increase activity slowly   Complete by: As directed       Allergies as of 09/06/2021       Reactions   Carvedilol Other (See Comments)   Sinus pause on telemetry >3 seconds. Longest one 9 sec. No AV nodal agent   Lisinopril Anaphylaxis, Shortness Of Breath, Swelling   Angioedema,  required intubation and mechanical ventilation   Chantix [varenicline] Other (See Comments)   Nightmares and unable to sleep well   Amoxicillin Nausea And Vomiting, Nausea Only   Nausea only - no allergy Did it involve swelling of the face/tongue/throat, SOB, or low BP? No Did it involve sudden or severe rash/hives, skin peeling, or any reaction on the inside of your mouth or nose? No Did you need to seek medical attention at a hospital or doctor's office? No When did it last happen? childhood reaction      If all above answers are NO, may proceed with cephalosporin use.        Medication List     STOP taking these medications    Spiriva Respimat 2.5 MCG/ACT Aers Generic drug: Tiotropium Bromide Monohydrate       TAKE these medications    Accu-Chek Aviva Plus test strip Generic drug: glucose blood Use as instructed to check blood glucose three times daily.   acetaminophen 500 MG tablet Commonly known as: TYLENOL Take 2 tablets (1,000 mg total) by mouth every 8 (eight) hours as needed for mild pain or fever. What changed: when to take this   albuterol (2.5 MG/3ML) 0.083% nebulizer solution Commonly known as: PROVENTIL Take 3 mLs (2.5 mg total) by nebulization every 6 (six) hours as needed for wheezing or shortness of breath.   albuterol 108 (90 Base) MCG/ACT inhaler Commonly known as: VENTOLIN HFA INHALE 2 PUFFS INTO THE LUNGS EVERY 6  HOURS AS NEEDEDFOR SHORTNESS OF BREATH OR WHEEZING.   amLODipine 10 MG tablet Commonly known as: NORVASC TAKE 1 TABLET BY MOUTH ONCE A DAY.   ascorbic acid 500 MG tablet Commonly known as: VITAMIN C Take 1 tablet (500 mg total) by mouth daily.   aspirin 81 MG EC tablet Take 1 tablet (81 mg total) by mouth daily. What changed: when to take this   atorvastatin 40 MG tablet Commonly known as: LIPITOR TAKE 1 TABLET BY MOUTH ONCE A DAY.   Azelastine HCl 137 MCG/SPRAY Soln USE 2 SPRAYS IN EACH NOSTRIL TWICE DAILY.    ciprofloxacin 500 MG tablet Commonly known as: Cipro Take 1 tablet (500 mg total) by mouth 2 (two) times daily for 2 days.   citalopram 20 MG tablet Commonly known as: CELEXA Take 20 mg by mouth daily.   cycloSPORINE 0.05 % ophthalmic emulsion Commonly known as: RESTASIS Place 1 drop into both eyes 2 (two) times daily.   dapagliflozin propanediol 10 MG Tabs tablet Commonly known as: Farxiga Take 1 tablet (10 mg total) by mouth daily before breakfast.   ferrous sulfate 325 (65 FE) MG tablet Take 1 tablet (325 mg total) by mouth daily with breakfast.   furosemide 40 MG tablet Commonly known as: LASIX Take 1 tablet (40 mg total) by mouth daily as needed for fluid.   glipiZIDE 5 MG 24 hr tablet Commonly known as: GLUCOTROL XL TAKE 1 TABLET BY MOUTH DAILY WITH BREAKFAST.   lactulose 10 GM/15ML solution Commonly known as: CHRONULAC Take 30 mLs (20 g total) by mouth 2 (two) times daily.   magnesium gluconate 500 MG tablet Commonly known as: MAGONATE Take 500 mg by mouth 2 (two) times daily.   metFORMIN 500 MG tablet Commonly known as: GLUCOPHAGE Take 1 tablet (500 mg total) by mouth 2 (two) times daily with a meal.   mometasone 0.1 % cream Commonly known as: ELOCON Apply 1 application topically 2 (two) times a week.   nitroGLYCERIN 0.4 MG SL tablet Commonly known as: Nitrostat Place 1 tablet (0.4 mg total) under the tongue every 5 (five) minutes as needed for chest pain.   olopatadine 0.1 % ophthalmic solution Commonly known as: PATANOL 1 drop 2 (two) times daily.   oseltamivir 75 MG capsule Commonly known as: TAMIFLU Take 1 capsule (75 mg total) by mouth 2 (two) times daily for 2 days.   pantoprazole 40 MG tablet Commonly known as: PROTONIX TAKE (1) TABLET BY MOUTH TWICE DAILY BEFORE A MEAL.   prednisoLONE acetate 1 % ophthalmic suspension Commonly known as: PRED FORTE Place 4 drops into both eyes 2 (two) times a week.   Trelegy Ellipta 100-62.5-25 MCG/ACT  Aepb Generic drug: Fluticasone-Umeclidin-Vilant Inhale 1 puff into the lungs daily.   triamcinolone ointment 0.1 % Commonly known as: KENALOG Apply 1 application topically 2 (two) times daily.   Trulicity 1.5 ZJ/6.7HA Sopn Generic drug: Dulaglutide Inject 1.5 mg into the skin once a week.   Vitamin D3 125 MCG (5000 UT) Caps Take 1 capsule (5,000 Units total) by mouth daily.               Durable Medical Equipment  (From admission, onward)           Start     Ordered   09/06/21 1439  DME Oxygen  Once       Question Answer Comment  Length of Need Lifetime   Mode or (Route) Nasal cannula   Liters per Minute 4  Frequency Continuous (stationary and portable oxygen unit needed)   Oxygen conserving device Yes   Oxygen delivery system Gas      09/06/21 1441               If you experience worsening of your admission symptoms, develop shortness of breath, life threatening emergency, suicidal or homicidal thoughts you must seek medical attention immediately by calling 911 or calling your MD immediately  if symptoms less severe.   You must read complete instructions/literature along with all the possible adverse reactions/side effects for all the medicines you take and that have been prescribed to you. Take any new medicines after you have completely understood and accept all the possible adverse reactions/side effects.    Please note   You were cared for by a hospitalist during your hospital stay. If you have any questions about your discharge medications or the care you received while you were in the hospital after you are discharged, you can call the unit and asked to speak with the hospitalist on call if the hospitalist that took care of you is not available. Once you are discharged, your primary care physician will handle any further medical issues. Please note that NO REFILLS for any discharge medications will be authorized once you are discharged, as it is  imperative that you return to your primary care physician (or establish a relationship with a primary care physician if you do not have one) for your aftercare needs so that they can reassess your need for medications and monitor your lab values.       Time coordinating discharge: 35 minutes  Signed:  Tivon Lemoine  [facility] 09/06/2021, 2:41 PM   Pager on [email or web address]. If 7PM-7AM, please contact night-coverage at [email or web address]

## 2021-09-06 NOTE — Progress Notes (Signed)
Nsg Discharge Note  Admit Date:  09/02/2021 Discharge date: 09/06/2021   Randy Hebert to be D/C'd Home per MD order.  AVS completed.   Patient/caregiver able to verbalize understanding.  Discharge Medication: Allergies as of 09/06/2021       Reactions   Carvedilol Other (See Comments)   Sinus pause on telemetry >3 seconds. Longest one 9 sec. No AV nodal agent   Lisinopril Anaphylaxis, Shortness Of Breath, Swelling   Angioedema, required intubation and mechanical ventilation   Chantix [varenicline] Other (See Comments)   Nightmares and unable to sleep well   Amoxicillin Nausea And Vomiting, Nausea Only   Nausea only - no allergy Did it involve swelling of the face/tongue/throat, SOB, or low BP? No Did it involve sudden or severe rash/hives, skin peeling, or any reaction on the inside of your mouth or nose? No Did you need to seek medical attention at a hospital or doctor's office? No When did it last happen? childhood reaction      If all above answers are NO, may proceed with cephalosporin use.        Medication List     STOP taking these medications    Spiriva Respimat 2.5 MCG/ACT Aers Generic drug: Tiotropium Bromide Monohydrate       TAKE these medications    Accu-Chek Aviva Plus test strip Generic drug: glucose blood Use as instructed to check blood glucose three times daily.   acetaminophen 500 MG tablet Commonly known as: TYLENOL Take 2 tablets (1,000 mg total) by mouth every 8 (eight) hours as needed for mild pain or fever. What changed: when to take this   albuterol (2.5 MG/3ML) 0.083% nebulizer solution Commonly known as: PROVENTIL Take 3 mLs (2.5 mg total) by nebulization every 6 (six) hours as needed for wheezing or shortness of breath.   albuterol 108 (90 Base) MCG/ACT inhaler Commonly known as: VENTOLIN HFA INHALE 2 PUFFS INTO THE LUNGS EVERY 6 HOURS AS NEEDEDFOR SHORTNESS OF BREATH OR WHEEZING.   amLODipine 10 MG tablet Commonly known as:  NORVASC TAKE 1 TABLET BY MOUTH ONCE A DAY.   ascorbic acid 500 MG tablet Commonly known as: VITAMIN C Take 1 tablet (500 mg total) by mouth daily.   aspirin 81 MG EC tablet Take 1 tablet (81 mg total) by mouth daily. What changed: when to take this   atorvastatin 40 MG tablet Commonly known as: LIPITOR TAKE 1 TABLET BY MOUTH ONCE A DAY.   Azelastine HCl 137 MCG/SPRAY Soln USE 2 SPRAYS IN EACH NOSTRIL TWICE DAILY.   ciprofloxacin 500 MG tablet Commonly known as: Cipro Take 1 tablet (500 mg total) by mouth 2 (two) times daily for 2 days.   citalopram 20 MG tablet Commonly known as: CELEXA Take 20 mg by mouth daily.   cycloSPORINE 0.05 % ophthalmic emulsion Commonly known as: RESTASIS Place 1 drop into both eyes 2 (two) times daily.   dapagliflozin propanediol 10 MG Tabs tablet Commonly known as: Farxiga Take 1 tablet (10 mg total) by mouth daily before breakfast.   ferrous sulfate 325 (65 FE) MG tablet Take 1 tablet (325 mg total) by mouth daily with breakfast.   furosemide 40 MG tablet Commonly known as: LASIX Take 1 tablet (40 mg total) by mouth daily as needed for fluid.   glipiZIDE 5 MG 24 hr tablet Commonly known as: GLUCOTROL XL TAKE 1 TABLET BY MOUTH DAILY WITH BREAKFAST.   lactulose 10 GM/15ML solution Commonly known as: CHRONULAC Take 30 mLs (20  g total) by mouth 2 (two) times daily.   magnesium gluconate 500 MG tablet Commonly known as: MAGONATE Take 500 mg by mouth 2 (two) times daily.   metFORMIN 500 MG tablet Commonly known as: GLUCOPHAGE Take 1 tablet (500 mg total) by mouth 2 (two) times daily with a meal.   mometasone 0.1 % cream Commonly known as: ELOCON Apply 1 application topically 2 (two) times a week.   nitroGLYCERIN 0.4 MG SL tablet Commonly known as: Nitrostat Place 1 tablet (0.4 mg total) under the tongue every 5 (five) minutes as needed for chest pain.   olopatadine 0.1 % ophthalmic solution Commonly known as: PATANOL 1 drop 2  (two) times daily.   oseltamivir 75 MG capsule Commonly known as: TAMIFLU Take 1 capsule (75 mg total) by mouth 2 (two) times daily for 2 days.   pantoprazole 40 MG tablet Commonly known as: PROTONIX TAKE (1) TABLET BY MOUTH TWICE DAILY BEFORE A MEAL.   prednisoLONE acetate 1 % ophthalmic suspension Commonly known as: PRED FORTE Place 4 drops into both eyes 2 (two) times a week.   Trelegy Ellipta 100-62.5-25 MCG/ACT Aepb Generic drug: Fluticasone-Umeclidin-Vilant Inhale 1 puff into the lungs daily.   triamcinolone ointment 0.1 % Commonly known as: KENALOG Apply 1 application topically 2 (two) times daily.   Trulicity 1.5 PI/9.5JO Sopn Generic drug: Dulaglutide Inject 1.5 mg into the skin once a week.   Vitamin D3 125 MCG (5000 UT) Caps Take 1 capsule (5,000 Units total) by mouth daily.               Durable Medical Equipment  (From admission, onward)           Start     Ordered   09/06/21 1439  DME Oxygen  Once       Question Answer Comment  Length of Need Lifetime   Mode or (Route) Nasal cannula   Liters per Minute 4   Frequency Continuous (stationary and portable oxygen unit needed)   Oxygen conserving device Yes   Oxygen delivery system Gas      09/06/21 1441            Discharge Assessment: Vitals:   09/06/21 1319 09/06/21 1425  BP:  118/68  Pulse:  68  Resp:  18  Temp:  97.9 F (36.6 C)  SpO2: 92% 96%   Skin clean, dry and intact without evidence of skin break down, no evidence of skin tears noted. IV catheter discontinued intact. Site without signs and symptoms of complications - no redness or edema noted at insertion site, patient denies c/o pain - only slight tenderness at site.  Dressing with slight pressure applied.  D/c Instructions-Education: Discharge instructions given to patient/family with verbalized understanding. D/c education completed with patient/family including follow up instructions, medication list, d/c activities  limitations if indicated, with other d/c instructions as indicated by MD - patient able to verbalize understanding, all questions fully answered. Patient instructed to return to ED, call 911, or call MD for any changes in condition.  Patient escorted via [place], and D/C home via private auto.  Kathie Rhodes, RN 09/06/2021 3:12 PM

## 2021-09-06 NOTE — Progress Notes (Addendum)
Patient in need of home oxygen. Discussed DME providers. Oxygen ordered through [place] with [employer]. Portable to be delivered to the room.    Latiffany Harwick, Clydene Pugh, LCSW

## 2021-09-06 NOTE — Anesthesia Preprocedure Evaluation (Signed)
Anesthesia Evaluation  Patient identified by MRN, date of birth, ID band Patient awake    Reviewed: Allergy & Precautions, H&P , NPO status , Patient's Chart, lab work & pertinent test results, reviewed documented beta blocker date and time   History of Anesthesia Complications (+) DIFFICULT AIRWAY and history of anesthetic complications  Airway Mallampati: I  TM Distance: >3 FB Neck ROM: full    Dental no notable dental hx.    Pulmonary shortness of breath, asthma , sleep apnea , pneumonia, COPD, Current Smoker,    Pulmonary exam normal breath sounds clear to auscultation       Cardiovascular Exercise Tolerance: Good hypertension, + CAD, + Past MI and + Cardiac Stents   Rhythm:regular Rate:Normal     Neuro/Psych  Headaches, PSYCHIATRIC DISORDERS Anxiety Depression  Neuromuscular disease    GI/Hepatic Neg liver ROS, GERD  Medicated,  Endo/Other  negative endocrine ROSdiabetes  Renal/GU negative Renal ROS  negative genitourinary   Musculoskeletal   Abdominal   Peds  Hematology  (+) Blood dyscrasia, anemia ,   Anesthesia Other Findings 1. Left ventricular ejection fraction, by estimation, is 45 to 50%. The  left ventricle has mildly decreased function. The left ventricle  demonstrates regional wall motion abnormalities (see scoring  diagram/findings for description). There is mild left  ventricular hypertrophy. Left ventricular diastolic parameters are  consistent with Grade I diastolic dysfunction (impaired relaxation).  2. Right ventricular systolic function is normal. The right ventricular  size is normal. Tricuspid regurgitation signal is inadequate for assessing  PA pressure.  3. Posterior pericardium appears thickened.  4. The mitral valve is grossly normal. Trivial mitral valve  regurgitation.  5. The aortic valve is tricuspid. Aortic valve regurgitation is not  visualized. Mild aortic valve sclerosis  is present, with no evidence of  aortic valve stenosis.  6. The inferior vena cava is normal in size with greater than 50%  respiratory variability, suggesting right atrial pressure of 3 mmHg.   Reproductive/Obstetrics negative OB ROS                             Anesthesia Physical Anesthesia Plan  ASA: 4 and emergent  Anesthesia Plan: General   Post-op Pain Management:    Induction:   PONV Risk Score and Plan:   Airway Management Planned:   Additional Equipment:   Intra-op Plan:   Post-operative Plan:   Informed Consent: I have reviewed the patients History and Physical, chart, labs and discussed the procedure including the risks, benefits and alternatives for the proposed anesthesia with the patient or authorized representative who has indicated his/her understanding and acceptance.     Dental Advisory Given  Plan Discussed with: CRNA  Anesthesia Plan Comments:         Anesthesia Quick Evaluation

## 2021-09-06 NOTE — Op Note (Addendum)
[facility] Patient Name: Danish Ruffins Procedure Date: 09/06/2021 10:03 AM MRN: [id number] Date of Birth: February 24, 1966 Attending MD: Maylon Peppers ,  CSN: [id number] Age: 56 Admit Type: Inpatient Procedure:                Upper GI endoscopy Indications:              Hematochezia, Melena Providers:                Maylon Peppers, Hughie Closs, RN, Tammy Vaught,                            RN, Nelma Rothman, Technician Referring MD:              Medicines:                Monitored Anesthesia Care Complications:            No immediate complications. Estimated Blood Loss:     Estimated blood loss: none. Procedure:                Pre-Anesthesia Assessment:                           - Prior to the procedure, a History and Physical                            was performed, and patient medications, allergies                            and sensitivities were reviewed. The patient's                            tolerance of previous anesthesia was reviewed.                           - The risks and benefits of the procedure and the                            sedation options and risks were discussed with the                            patient. All questions were answered and informed                            consent was obtained.                           - ASA Grade Assessment: III - A patient with severe                            systemic disease.                           After obtaining informed consent, the endoscope was                            passed under direct vision. Throughout the  procedure, the patient's blood pressure, pulse, and                            oxygen saturations were monitored continuously. The                            GIF-H190 ([id number]) scope was introduced through the                            mouth, and advanced to the second part of duodenum.                            The upper GI endoscopy was accomplished without                             difficulty. The patient tolerated the procedure                            well. Scope In: 10:24:32 AM Scope Out: 10:29:31 AM Total Procedure Duration: 0 hours 4 minutes 59 seconds  Findings:      Three columns of grade I varices were found in the distal esophagus. No       stigmata of recent bleeding or red wale signs were found.      A small hiatal hernia was present.      Mild portal hypertensive gastropathy was found in the entire examined       stomach.      The examined duodenum was normal. Impression:               - Grade I esophageal varices.                           - Small hiatal hernia.                           - Portal hypertensive gastropathy.                           - Normal examined duodenum.                           - No specimens collected. Moderate Sedation:      Per Anesthesia Care Recommendation:           - Return patient to hospital ward for ongoing care.                           - Full liquid diet today, advance to low sodium                            diet tomorrow if tolerating.                           - Check H/H daily.                           - Stop octreotide and switch to  pantoprazole 40 mg                            qday.                           - Unless patient presents recurrent episodes of                            rectal bleeding, will set up for outpatient                            colonoscopy.                           - Repeat upper endoscopy in 1 year for surveillance.                           - Strict alcohol cessation. Procedure Code(s):        --- Professional ---                           [phone], Esophagogastroduodenoscopy, flexible,                            transoral; diagnostic, including collection of                            specimen(s) by brushing or washing, when performed                            (separate procedure) Diagnosis Code(s):        --- Professional ---                            I85.00, Esophageal varices without bleeding                           K44.9, Diaphragmatic hernia without obstruction or                            gangrene                           K76.6, Portal hypertension                           K31.89, Other diseases of stomach and duodenum                           K92.1, Melena (includes Hematochezia) CPT copyright 2019 American Medical Association. All rights reserved. The codes documented in this report are preliminary and upon coder review may  be revised to meet current compliance requirements. Maylon Peppers, MD Maylon Peppers,  09/06/2021 10:39:59 AM This report has been signed electronically. Number of Addenda: 0

## 2021-09-06 NOTE — Transfer of Care (Signed)
Immediate Anesthesia Transfer of Care Note  Patient: Randy Hebert  Procedure(s) Performed: ESOPHAGOGASTRODUODENOSCOPY (EGD) WITH PROPOFOL  Patient Location: PACU  Anesthesia Type:General  Level of Consciousness: awake, alert , oriented and patient cooperative  Airway & Oxygen Therapy: Patient Spontanous Breathing and Patient connected to nasal cannula oxygen  Post-op Assessment: Report given to RN, Post -op Vital signs reviewed and stable and Patient moving all extremities X 4  Post vital signs: Reviewed and stable  Last Vitals:  Vitals Value Taken Time  BP 105/66 09/06/21 1037  Temp    Pulse    Resp 25 09/06/21 1039  SpO2    Vitals shown include unvalidated device data.  Last Pain:  Vitals:   09/06/21 0932  TempSrc: Oral  PainSc:          Complications: No notable events documented.

## 2021-09-07 LAB — CULTURE, BLOOD (ROUTINE X 2)
Culture: NO GROWTH
Culture: NO GROWTH
Special Requests: ADEQUATE
Special Requests: ADEQUATE

## 2021-09-09 ENCOUNTER — Telehealth: Payer: Self-pay | Admitting: *Deleted

## 2021-09-09 ENCOUNTER — Encounter (HOSPITAL_COMMUNITY): Payer: Self-pay | Admitting: Gastroenterology

## 2021-09-09 NOTE — Telephone Encounter (Signed)
Pt is calling returning call

## 2021-09-09 NOTE — Telephone Encounter (Signed)
Transition Care Management Unsuccessful Follow-up Telephone Call  Date of discharge and from where:  09/06/2021  Attempts:  2nd Attempt  Reason for unsuccessful TCM follow-up call:  Left voice message

## 2021-09-09 NOTE — Chronic Care Management (AMB) (Signed)
°  Care Management   Note  09/09/2021 Name: [name] MRN: [id number] DOB: Nov 09, 1965  Randy Hebert is a 56 y.o. year old male who is a primary care patient of Lindell Spar, MD and is actively engaged with the care management team. I reached out to [name] by phone today to assist with re-scheduling a follow up visit with the Pharmacist  Follow up plan: Unsuccessful telephone outreach attempt made. A HIPAA compliant phone message was left for the patient providing contact information and requesting a return call.  The care management team will reach out to the patient again over the next 7 days.  If patient returns call to provider office, please advise to call [place] at [phone].  [facility]  Direct Dial: [phone]

## 2021-09-09 NOTE — Telephone Encounter (Signed)
Transition Care Management Unsuccessful Follow-up Telephone Call  Date of discharge and from where:  09/06/2021  Attempts:  1st Attempt  Reason for unsuccessful TCM follow-up call:  Left voice message

## 2021-09-10 ENCOUNTER — Telehealth: Payer: Self-pay | Admitting: *Deleted

## 2021-09-10 LAB — LEGIONELLA PNEUMOPHILA SEROGP 1 UR AG: L. pneumophila Serogp 1 Ur Ag: NEGATIVE

## 2021-09-10 NOTE — Telephone Encounter (Signed)
Transition Care Management Follow-up Telephone Call Date of discharge and from where: 09/06/2021 How have you been since you were released from the hospital? Patient has been doing well since being home.  Any questions or concerns? No  Items Reviewed: Did the pt receive and understand the discharge instructions provided? Yes  Medications obtained and verified? Yes  Other? No  Any new allergies since your discharge? No  Dietary orders reviewed? Yes Do you have support at home? Yes   Home Care and Equipment/Supplies: Were home health services ordered? no If so, what is the name of the agency? NA  Has the agency set up a time to come to the patient's home? not applicable Were any new equipment or medical supplies ordered?  Yes: Home Oxygen What is the name of the medical supply agency? Unsure of name Were you able to get the supplies/equipment? yes Do you have any questions related to the use of the equipment or supplies? No  Functional Questionnaire: (I = Independent and D = Dependent) ADLs: I  Bathing/Dressing- I  Meal Prep- I  Eating- I  Maintaining continence- I  Transferring/Ambulation- I  Managing Meds- I  Follow up appointments reviewed:  PCP Hospital f/u appt confirmed? Yes  Scheduled to see Dr. Posey Pronto on 09/13/2021 @ 9am.  Are transportation arrangements needed? No  If their condition worsens, is the pt aware to call PCP or go to the Emergency Dept.? Yes Was the patient provided with contact information for the PCP's office or ED? Yes Was to pt encouraged to call back with questions or concerns? Yes

## 2021-09-10 NOTE — Telephone Encounter (Signed)
Completed TOC call with patient this morning. He is coming in for [facility] F/U this Friday. He would like to know if another round of antibiotics could be sent in before visit? He still has cough with phlegm. Completed course of Cipro and Tamiflu which were Rx'd while he was in the hospital. Has been using Breo inhaler which was given to him at [facility] as well. Patient just thinks everything is not quite cleared up yet. Please advise

## 2021-09-10 NOTE — Telephone Encounter (Signed)
Left detailed message on patients VM (ok per DPR) with providers recommendations.

## 2021-09-13 ENCOUNTER — Encounter: Payer: Self-pay | Admitting: Internal Medicine

## 2021-09-13 ENCOUNTER — Ambulatory Visit (INDEPENDENT_AMBULATORY_CARE_PROVIDER_SITE_OTHER): Payer: Commercial Managed Care - HMO | Admitting: Internal Medicine

## 2021-09-13 ENCOUNTER — Other Ambulatory Visit: Payer: Self-pay

## 2021-09-13 VITALS — BP 118/62 | HR 81 | Resp 18 | Ht 65.0 in | Wt 202.0 lb

## 2021-09-13 DIAGNOSIS — E782 Mixed hyperlipidemia: Secondary | ICD-10-CM | POA: Diagnosis not present

## 2021-09-13 DIAGNOSIS — I10 Essential (primary) hypertension: Secondary | ICD-10-CM | POA: Diagnosis not present

## 2021-09-13 DIAGNOSIS — E875 Hyperkalemia: Secondary | ICD-10-CM | POA: Diagnosis not present

## 2021-09-13 DIAGNOSIS — G4700 Insomnia, unspecified: Secondary | ICD-10-CM

## 2021-09-13 DIAGNOSIS — J9601 Acute respiratory failure with hypoxia: Secondary | ICD-10-CM | POA: Insufficient documentation

## 2021-09-13 DIAGNOSIS — D500 Iron deficiency anemia secondary to blood loss (chronic): Secondary | ICD-10-CM

## 2021-09-13 DIAGNOSIS — J441 Chronic obstructive pulmonary disease with (acute) exacerbation: Secondary | ICD-10-CM | POA: Diagnosis not present

## 2021-09-13 DIAGNOSIS — Z09 Encounter for follow-up examination after completed treatment for conditions other than malignant neoplasm: Secondary | ICD-10-CM | POA: Insufficient documentation

## 2021-09-13 MED ORDER — TRAZODONE HCL 50 MG PO TABS: 25.0000 mg | ORAL_TABLET | Freq: Every evening | ORAL | 3 refills | Status: DC | PRN

## 2021-09-13 MED ORDER — METHYLPREDNISOLONE SODIUM SUCC 125 MG IJ SOLR
125.0000 mg | Freq: Once | INTRAMUSCULAR | Status: AC
  Administered 2021-09-13: 125 mg via INTRAMUSCULAR

## 2021-09-13 NOTE — Patient Instructions (Addendum)
Please continue using Trelegy regularly and use Albuterol about 4 times in a day as needed for shortness of breath or wheezing.  You are being referred to Pulmonology.  Please start taking Trazodone as needed for insomnia.  Please maintain simple sleep hygiene. - Maintain dark and non-noisy environment in the bedroom. - Please use the bedroom for sleep and sexual activity only. - Do not use electronic devices in the bedroom. - Please take dinner at least 2 hours before bedtime. - Please avoid caffeinated products in the evening, including coffee, soft drinks. - Please try to maintain the regular sleep-wake cycle - Go to bed and wake up at the same time.  Please contact your Gastroenterologist to schedule sooner appointment.  Please quit smoking soon. Continue to refrain from alcohol.

## 2021-09-13 NOTE — Progress Notes (Signed)
Established Patient Office Visit  Subjective:  Patient ID: Randy Hebert, male    DOB: [birth year]  Age: 56 y.o. MRN: [id number]  CC:  Chief Complaint  Patient presents with   Transitions Of Care    Pt had flu and liver problems at the same time pt has been sob since being home has oxygen only tries to use it at night     HPI Randy Hebert is a 56 y.o. male with past medical history of CAD s/p stent placement, HFrEF, type II DM with HLD, COPD, OSA, GERD, depression with anxiety and tobacco abuse whopresents for f/u after recent hospitalization for acute hypoxic respiratory failure due to COPD exacerbation and influenza.  He was admitted from 01/02-01/06. He was found to have acute hypoxemic respiratory failure, influenza A infection and acute GI bleeding.  He was treated with Tamiflu, oxygen via nasal cannula, IV Protonix drip, IV octreotide drip and empiric IV antibiotics.  He had acute blood loss anemia but he did not require transfusion.  He also had hypokalemia and hypomagnesemia that improved with treatment.  He was treated with lactulose because of elevated ammonia levels.   He underwent EGD which showed grade 1 esophageal varices, small hiatal hernia, portal hypertensive gastropathy and normal examined duodenum. He was discharged on 4 L/min oxygen via nasal cannula for chronic hypoxemic respiratory failure likely from his COPD.  He has been feeling better now.  He continues to have chronic cough, dyspnea and wheezing.  He uses Trelegy and as needed albuterol for COPD.  He has been trying to cut down on smoking.  He has not used alcohol since being discharged from the hospital.  He denies any fever, chills, chest pain or palpitations.  He complains of insomnia since he has not used alcohol.  He denies anxiety during daytime, SI or HI.  He uses CPAP for OSA.    Past Medical History:  Diagnosis Date   ACE inhibitor-aggravated angioedema    Allergy    Angio-edema    Anxiety     Arthritis    Asthma    hip replacement   Back pain    Bradycardia 04/28/2017   Bulging of cervical intervertebral disc    CAD (coronary artery disease)    lateral STEMI 02/06/2015 00% D1 occlusion treated with Promus Premier 2.5 mm x 16 mm DES, 70% ramus stenosis, 40% mid RCA stenosis, 45% distal RCA stenosis, EF 45-50%   CHF (congestive heart failure) (HCC)    COPD (chronic obstructive pulmonary disease) ([place])    Depression    Diabetes mellitus without complication ([place])    Difficult intubation    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   Dry eye    Dyspnea    Early satiety 09/23/2016   GERD (gastroesophageal reflux disease)    HCAP (healthcare-associated pneumonia) 05/15/2017   Headache    Heart murmur    Hip pain    Hyperlipidemia    Hypertension    Lobar pneumonia ([place]) 05/15/2017   Melena 08/04/2018   MI (myocardial infarction) [facility])    Myocardial infarction ([place])    Neck pain    Non-ST elevation (NSTEMI) myocardial infarction ([place]) 04/27/2017   NSTEMI (non-ST elevated myocardial infarction) ([place]) 04/26/2017   Otitis media    Pleurisy    Pneumonia due to COVID-19 virus 10/07/2019   Rectal bleeding 11/08/2018   Right shoulder pain 03/20/2020   Sinus pause    9 sec sinus pause on telemetry after  started on coreg after MI, avoid AV nodal blocking agent   Sleep apnea    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   STEMI (ST elevation myocardial infarction) ([place]) 05/31/2015   Substance abuse ([place]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   Syncope 50/93/2671   Syncope and collapse 05/15/2017   Transaminitis 08/04/2018   Unilateral primary osteoarthritis, right hip 07/01/2016    Past Surgical History:  Procedure Laterality Date   BIOPSY  10/09/2016   Procedure: BIOPSY;  Surgeon: Daneil Dolin, MD;  [facility];  Service: Endoscopy;;   BIOPSY  11/28/2019   Procedure: BIOPSY;  Surgeon: Daneil Dolin, MD;  [facility];  Service: Endoscopy;;   CARDIAC  CATHETERIZATION N/A 02/06/2015   Procedure: Left Heart Cath and Coronary Angiography;  Surgeon: Leonie Man, MD;  Location: [place] [facility];  Service: Cardiovascular;  Laterality: N/A;   CARDIAC CATHETERIZATION N/A 02/06/2015   Procedure: Coronary Stent Intervention;  Surgeon: Leonie Man, MD;  Location: [place] [facility];  Service: Cardiovascular;  Laterality: N/A;   COLONOSCOPY WITH PROPOFOL N/A 10/09/2016   Sigmoid and descending colon diverticulosis, four 4-6 mm polyps in sigmoid, one 4 mm polyp in descending. Tubular adenomas and hyperplastic. 5 year surveillance.    COLONOSCOPY WITH PROPOFOL N/A 11/24/2016   Sigmoid and descending colon diverticulosis, four 4-6 mm polyps in sigmoid, one 4 mm polyp in descending. Tubular adenomas and hyperplastic. 5 year surveillance.    CORONARY ANGIOPLASTY WITH STENT PLACEMENT  01/2015   CORONARY STENT INTERVENTION N/A 04/27/2017   Procedure: CORONARY STENT INTERVENTION;  Surgeon: Nelva Bush, MD;  Location: [place] [facility];  Service: Cardiovascular;  Laterality: N/A;   ELECTROPHYSIOLOGY STUDY N/A 06/29/2017   Procedure: ELECTROPHYSIOLOGY STUDY;  Surgeon: Evans Lance, MD;  Location: [place] [facility];  Service: Cardiovascular;  Laterality: N/A;   ESOPHAGOGASTRODUODENOSCOPY (EGD) WITH PROPOFOL N/A 10/09/2016   Dr. Gala Romney: LA grade a esophagitis.  Barrett's esophagus, biopsy-proven.  Small hiatal hernia.  EGD February 2019   ESOPHAGOGASTRODUODENOSCOPY (EGD) WITH PROPOFOL N/A 11/28/2019    salmon-colored esophageal mucosa (Barrett's) small hiatal hernia, portal hypertensive gastropathy, normal duodenum, 3 year surveillance   ESOPHAGOGASTRODUODENOSCOPY (EGD) WITH PROPOFOL N/A 09/06/2021   Procedure: ESOPHAGOGASTRODUODENOSCOPY (EGD) WITH PROPOFOL;  Surgeon: Harvel Quale, MD;  [facility];  Service: Gastroenterology;  Laterality: N/A;   INCISION / DRAINAGE HAND / FINGER     LEFT HEART CATH AND CORONARY ANGIOGRAPHY N/A  04/27/2017   Procedure: LEFT HEART CATH AND CORONARY ANGIOGRAPHY;  Surgeon: Nelva Bush, MD;  Location: [place] [facility];  Service: Cardiovascular;  Laterality: N/A;   LOOP RECORDER INSERTION  06/29/2017   Procedure: Loop Recorder Insertion;  Surgeon: Evans Lance, MD;  Location: [place] [facility];  Service: Cardiovascular;;   POLYPECTOMY  11/24/2016   Procedure: POLYPECTOMY;  Surgeon: Daneil Dolin, MD;  [facility];  Service: Endoscopy;;  descending and sigmoid   TOTAL HIP ARTHROPLASTY Right 07/01/2016   TOTAL HIP ARTHROPLASTY Right 07/01/2016   Procedure: RIGHT TOTAL HIP ARTHROPLASTY ANTERIOR APPROACH;  Surgeon: Mcarthur Rossetti, MD;  Location: [place];  Service: Orthopedics;  Laterality: Right;    Family History  Problem Relation Age of Onset   Heart attack Father    Stroke Father    Arthritis Father    Heart disease Father    Cancer Mother        ???   Arthritis Mother  Heart disease Brother 62       died in sleep   Early death Brother    Diabetes Maternal Uncle    Alzheimer's disease Maternal Grandmother     Social History   Socioeconomic History   Marital status: Divorced    Spouse name: alice   Number of children: 3   Years of education: 12   Highest education level: Not on file  Occupational History   Occupation: SSI  Tobacco Use   Smoking status: Every Day    Packs/day: 0.50    Years: 46.00    Pack years: 23.00    Types: Cigarettes   Smokeless tobacco: Former    Types: Nurse, children's Use: Former  Substance and Sexual Activity   Alcohol use: Yes    Comment: couple beers occ   Drug use: No    Comment: history of drug use- marijuana, cocaine- quit 2014   Sexual activity: Yes    Partners: Female    Birth control/protection: None  Other Topics Concern   Not on file  Social History Narrative   Lives with girlfriend [name]   3 children, but 1 OD  In 2019.   Grandchildren-2      Two cats: may and princess; and a  dog-foxy      Enjoy: spending time with pets, TV, yard work       Diet: eats all food groups   Caffeine: half and half coffee, some tea-half and half decaf   Water: 6-8 cups daily      Wears seat belt   Does not use phone while driving   Oceanographer at home      Social Determinants of Health   Financial Resource Strain: Low Risk    Difficulty of Paying Living Expenses: Not hard at all  Food Insecurity: No Food Insecurity   Worried About Charity fundraiser in the Last Year: Never true   Arboriculturist in the Last Year: Never true  Transportation Needs: No Transportation Needs   Lack of Transportation (Medical): No   Lack of Transportation (Non-Medical): No  Physical Activity: Insufficiently Active   Days of Exercise per Week: 3 days   Minutes of Exercise per Session: 20 min  Stress: No Stress Concern Present   Feeling of Stress : Not at all  Social Connections: Moderately Integrated   Frequency of Communication with Friends and Family: More than three times a week   Frequency of Social Gatherings with Friends and Family: More than three times a week   Attends Religious Services: More than 4 times per year   Active Member of [employer] or Organizations: Yes   Attends Music therapist: More than 4 times per year   Marital Status: Divorced  Human resources officer Violence: Not At Risk   Fear of Current or Ex-Partner: No   Emotionally Abused: No   Physically Abused: No   Sexually Abused: No    Outpatient Medications Prior to Visit  Medication Sig Dispense Refill   acetaminophen (TYLENOL) 500 MG tablet Take 2 tablets (1,000 mg total) by mouth every 8 (eight) hours as needed for mild pain or fever.     albuterol (PROVENTIL) (2.5 MG/3ML) 0.083% nebulizer solution Take 3 mLs (2.5 mg total) by nebulization every 6 (six) hours as needed for wheezing or shortness of breath. 150 mL 1   albuterol (VENTOLIN HFA) 108 (90 Base) MCG/ACT inhaler INHALE 2 PUFFS INTO THE LUNGS EVERY  6 HOURS AS NEEDEDFOR SHORTNESS OF BREATH OR WHEEZING. 18 g 1   amLODipine (NORVASC) 10 MG tablet TAKE 1 TABLET BY MOUTH ONCE A DAY. 90 tablet 0   ascorbic acid (VITAMIN C) 500 MG tablet Take 1 tablet (500 mg total) by mouth daily. 30 tablet 1   aspirin 81 MG EC tablet Take 1 tablet (81 mg total) by mouth daily. (Patient taking differently: Take 81 mg by mouth in the morning and at bedtime.) 30 tablet    atorvastatin (LIPITOR) 40 MG tablet TAKE 1 TABLET BY MOUTH ONCE A DAY. 30 tablet 0   Azelastine HCl 137 MCG/SPRAY SOLN USE 2 SPRAYS IN EACH NOSTRIL TWICE DAILY. 30 mL 0   Cholecalciferol (VITAMIN D3) 125 MCG (5000 UT) CAPS Take 1 capsule (5,000 Units total) by mouth daily. 90 capsule 0   citalopram (CELEXA) 20 MG tablet Take 20 mg by mouth daily.     cycloSPORINE (RESTASIS) 0.05 % ophthalmic emulsion Place 1 drop into both eyes 2 (two) times daily.     dapagliflozin propanediol (FARXIGA) 10 MG TABS tablet Take 1 tablet (10 mg total) by mouth daily before breakfast. 30 tablet 2   Dulaglutide (TRULICITY) 1.5 JK/9.3OI SOPN Inject 1.5 mg into the skin once a week. 2 mL 2   ferrous sulfate 325 (65 FE) MG tablet Take 1 tablet (325 mg total) by mouth daily with breakfast. 90 tablet 1   Fluticasone-Umeclidin-Vilant (TRELEGY ELLIPTA) 100-62.5-25 MCG/ACT AEPB Inhale 1 puff into the lungs daily. 1 each 0   furosemide (LASIX) 40 MG tablet Take 1 tablet (40 mg total) by mouth daily as needed for fluid. 30 tablet 11   glipiZIDE (GLUCOTROL XL) 5 MG 24 hr tablet TAKE 1 TABLET BY MOUTH DAILY WITH BREAKFAST. 90 tablet 0   glucose blood (ACCU-CHEK AVIVA PLUS) test strip Use as instructed to check blood glucose three times daily. 100 each 2   lactulose (CHRONULAC) 10 GM/15ML solution Take 30 mLs (20 g total) by mouth 2 (two) times daily. 1800 mL 0   magnesium gluconate (MAGONATE) 500 MG tablet Take 500 mg by mouth 2 (two) times daily.     metFORMIN (GLUCOPHAGE) 500 MG tablet Take 1 tablet (500 mg total) by mouth 2  (two) times daily with a meal. 60 tablet 2   mometasone (ELOCON) 0.1 % cream Apply 1 application topically 2 (two) times a week.      nitroGLYCERIN (NITROSTAT) 0.4 MG SL tablet Place 1 tablet (0.4 mg total) under the tongue every 5 (five) minutes as needed for chest pain. 25 tablet 3   olopatadine (PATANOL) 0.1 % ophthalmic solution 1 drop 2 (two) times daily.     pantoprazole (PROTONIX) 40 MG tablet TAKE (1) TABLET BY MOUTH TWICE DAILY BEFORE A MEAL. 60 tablet 5   prednisoLONE acetate (PRED FORTE) 1 % ophthalmic suspension Place 4 drops into both eyes 2 (two) times a week.      triamcinolone ointment (KENALOG) 0.1 % Apply 1 application topically 2 (two) times daily. 454 g 1   No facility-administered medications prior to visit.    Allergies  Allergen Reactions   Carvedilol Other (See Comments)    Sinus pause on telemetry >3 seconds. Longest one 9 sec. No AV nodal agent   Lisinopril Anaphylaxis, Shortness Of Breath and Swelling    Angioedema, required intubation and mechanical ventilation   Chantix [Varenicline] Other (See Comments)    Nightmares and unable to sleep well   Amoxicillin Nausea And Vomiting and Nausea Only  Nausea only - no allergy  Did it involve swelling of the face/tongue/throat, SOB, or low BP? No Did it involve sudden or severe rash/hives, skin peeling, or any reaction on the inside of your mouth or nose? No Did you need to seek medical attention at a hospital or doctor's office? No When did it last happen? childhood reaction      If all above answers are NO, may proceed with cephalosporin use.     ROS Review of Systems  Constitutional:  Negative for chills.  HENT:  Positive for congestion. Negative for sinus pressure and sinus pain.   Eyes:  Negative for pain and discharge.  Respiratory:  Positive for cough and shortness of breath.   Gastrointestinal:  Negative for diarrhea, nausea and vomiting.  Genitourinary:  Negative for dysuria and hematuria.   Musculoskeletal:  Negative for neck pain and neck stiffness.  Skin:  Negative for rash.       Dry skin over scalp region  Neurological:  Negative for dizziness and weakness.  Psychiatric/Behavioral:  Negative for agitation and behavioral problems.      Objective:    Physical Exam Vitals reviewed.  Constitutional:      General: He is not in acute distress.    Appearance: He is obese. He is not diaphoretic.  HENT:     Head: Normocephalic and atraumatic.     Nose: Nose normal.     Mouth/Throat:     Mouth: Mucous membranes are moist.  Eyes:     General: No scleral icterus.    Extraocular Movements: Extraocular movements intact.  Cardiovascular:     Rate and Rhythm: Normal rate and regular rhythm.     Pulses: Normal pulses.     Heart sounds: Normal heart sounds. No murmur heard. Pulmonary:     Breath sounds: Wheezing (Bilateral diffuse) present. No rales.  Abdominal:     Palpations: Abdomen is soft.     Tenderness: There is no abdominal tenderness.  Musculoskeletal:     Cervical back: Neck supple. No tenderness.     Right lower leg: No edema.     Left lower leg: No edema.  Skin:    General: Skin is warm.     Findings: No rash.  Neurological:     General: No focal deficit present.     Mental Status: He is alert and oriented to person, place, and time.     Sensory: Sensory deficit (4th and 5th digits of left hand) present.     Motor: No weakness.  Psychiatric:        Mood and Affect: Mood normal.        Behavior: Behavior normal.    BP 118/62 (BP Location: Left Arm, Patient Position: Sitting, Cuff Size: Normal)    Pulse 81    Resp 18    Ht _0  (1.651 m)    Wt 202 lb (91.6 kg)    SpO2 95%    BMI 33.61 kg/m  Wt Readings from Last 3 Encounters:  09/13/21 202 lb (91.6 kg)  09/05/21 212 lb 11.9 oz (96.5 kg)  07/24/21 209 lb 0.6 oz (94.8 kg)    Lab Results  Component Value Date   TSH 3.61 06/21/2020   Lab Results  Component Value Date   WBC 4.8 09/06/2021   HGB  8.8 (L) 09/06/2021   HCT 27.1 (L) 09/06/2021   MCV 94.8 09/06/2021   PLT 147 (L) 09/06/2021   Lab Results  Component Value Date   NA  139 09/06/2021   K 3.7 09/06/2021   CO2 32 09/06/2021   GLUCOSE 140 (H) 09/06/2021   BUN 5 (L) 09/06/2021   CREATININE 0.64 09/06/2021   BILITOT 0.4 09/06/2021   ALKPHOS 77 09/06/2021   AST 41 09/06/2021   ALT 29 09/06/2021   PROT 5.7 (L) 09/06/2021   ALBUMIN 3.0 (L) 09/06/2021   CALCIUM 8.2 (L) 09/06/2021   ANIONGAP 7 09/06/2021   EGFR 108 04/11/2021   Lab Results  Component Value Date   CHOL 124 04/11/2021   Lab Results  Component Value Date   HDL 28 (L) 04/11/2021   Lab Results  Component Value Date   LDLCALC 74 04/11/2021   Lab Results  Component Value Date   TRIG 118 04/11/2021   Lab Results  Component Value Date   CHOLHDL 7.1 (H) 06/21/2020   Lab Results  Component Value Date   HGBA1C 6.8 (H) 09/03/2021      Assessment & Plan:   Hospital discharge follow-up Hospital chart reviewed including discharge summary Acute hypoxic respiratory failure due to COPD exacerbation and influenza infection, now better, uses home O2 at nighttime Had elevated ammonia level, could be related to alcohol abuse, liver enzymes WNL -was given lactulose, has loose BM now, mental status at baseline We will check CBC and CMP today  COPD exacerbation ([place]) Recent hospitalization for COPD exacerbation Trying to cut down smoking, advised to quit soon Uses Trelegy and PRN Albuterol Has wheezing today, SoluMedrol given today Uses O2 at nighttime Referred to Pulmonology  Acute respiratory failure with hypoxia ([place]) Due to COPD exacerbation and influenza Uses home O2 PRN and at nighttime Referred to Pulmonology  Insomnia Likely due to h/o alcohol abuse Started Trazodone PRN Sleep hygiene material provided    No orders of the defined types were placed in this encounter.   Follow-up: Return if symptoms worsen or fail to improve.     Lindell Spar, MD

## 2021-09-13 NOTE — Addendum Note (Signed)
Addended by: Zacarias Pontes R on: 09/13/2021 10:21 AM   Modules accepted: Orders

## 2021-09-13 NOTE — Assessment & Plan Note (Signed)
Likely due to h/o alcohol abuse Started Trazodone PRN Sleep hygiene material provided

## 2021-09-13 NOTE — Assessment & Plan Note (Signed)
Recent hospitalization for COPD exacerbation Trying to cut down smoking, advised to quit soon Uses Trelegy and PRN Albuterol Has wheezing today, SoluMedrol given today Uses O2 at nighttime Referred to Pulmonology

## 2021-09-13 NOTE — Assessment & Plan Note (Signed)
Hospital chart reviewed including discharge summary Acute hypoxic respiratory failure due to COPD exacerbation and influenza infection, now better, uses home O2 at nighttime Had elevated ammonia level, could be related to alcohol abuse, liver enzymes WNL -was given lactulose, has loose BM now, mental status at baseline We will check CBC and CMP today

## 2021-09-13 NOTE — Assessment & Plan Note (Signed)
Due to COPD exacerbation and influenza Uses home O2 PRN and at nighttime Referred to Pulmonology

## 2021-09-14 LAB — CMP14+EGFR
ALT: 27 IU/L (ref 0–44)
AST: 29 IU/L (ref 0–40)
Albumin/Globulin Ratio: 1.2 (ref 1.2–2.2)
Albumin: 4 g/dL (ref 3.8–4.9)
Alkaline Phosphatase: 179 IU/L — ABNORMAL HIGH (ref 44–121)
BUN/Creatinine Ratio: 12 (ref 9–20)
BUN: 9 mg/dL (ref 6–24)
Bilirubin Total: 0.5 mg/dL (ref 0.0–1.2)
CO2: 24 mmol/L (ref 20–29)
Calcium: 9.9 mg/dL (ref 8.7–10.2)
Chloride: 100 mmol/L (ref 96–106)
Creatinine, Ser: 0.74 mg/dL — ABNORMAL LOW (ref 0.76–1.27)
Globulin, Total: 3.3 g/dL (ref 1.5–4.5)
Glucose: 162 mg/dL — ABNORMAL HIGH (ref 70–99)
Potassium: 5.1 mmol/L (ref 3.5–5.2)
Sodium: 139 mmol/L (ref 134–144)
Total Protein: 7.3 g/dL (ref 6.0–8.5)
eGFR: 106 mL/min/{1.73_m2} (ref >59)

## 2021-09-14 LAB — CBC WITH DIFFERENTIAL/PLATELET
Basophils Absolute: 0 10*3/uL (ref 0.0–0.2)
Basos: 0 %
EOS (ABSOLUTE): 0.2 10*3/uL (ref 0.0–0.4)
Eos: 2 %
Hematocrit: 30.3 % — ABNORMAL LOW (ref 37.5–51.0)
Hemoglobin: 10.2 g/dL — ABNORMAL LOW (ref 13.0–17.7)
Immature Grans (Abs): 0.1 10*3/uL (ref 0.0–0.1)
Immature Granulocytes: 1 %
Lymphocytes Absolute: 1.8 10*3/uL (ref 0.7–3.1)
Lymphs: 18 %
MCH: 29.4 pg (ref 26.6–33.0)
MCHC: 33.7 g/dL (ref 31.5–35.7)
MCV: 87 fL (ref 79–97)
Monocytes Absolute: 0.9 10*3/uL (ref 0.1–0.9)
Monocytes: 10 %
Neutrophils Absolute: 6.6 10*3/uL (ref 1.4–7.0)
Neutrophils: 69 %
Platelets: 444 10*3/uL (ref 150–450)
RBC: 3.47 x10E6/uL — ABNORMAL LOW (ref 4.14–5.80)
RDW: 14 % (ref 11.6–15.4)
WBC: 9.5 10*3/uL (ref 3.4–10.8)

## 2021-09-16 ENCOUNTER — Encounter: Payer: Self-pay | Admitting: *Deleted

## 2021-09-17 ENCOUNTER — Other Ambulatory Visit: Payer: Self-pay | Admitting: "Endocrinology

## 2021-09-17 ENCOUNTER — Telehealth: Payer: Self-pay | Admitting: Gastroenterology

## 2021-09-17 NOTE — Telephone Encounter (Signed)
Needs hospital follow-up sooner than scheduled (in February). With me if possible but If no opening, can be any APP. Magda Paganini and Lula saw in hospital as well.

## 2021-09-17 NOTE — Chronic Care Management (AMB) (Signed)
°  Care Management   Note  09/17/2021 Name: [name] MRN: [id number] DOB: Nov 23, 1965  Randy Hebert is a 56 y.o. year old male who is a primary care patient of Lindell Spar, MD and is actively engaged with the care management team. I reached out to [name] by phone today to assist with re-scheduling a follow up visit with the Pharmacist  Follow up plan: Unsuccessful telephone outreach attempt made. A HIPAA compliant phone message was left for the patient providing contact information and requesting a return call. The care management team will reach out to the patient again over the next 7 days.  If patient returns call to provider office, please advise to call Gardner at [facility]  Direct Dial: [phone]

## 2021-09-18 ENCOUNTER — Other Ambulatory Visit: Payer: Self-pay

## 2021-09-18 ENCOUNTER — Telehealth: Payer: Self-pay

## 2021-09-18 ENCOUNTER — Ambulatory Visit (INDEPENDENT_AMBULATORY_CARE_PROVIDER_SITE_OTHER): Payer: Commercial Managed Care - HMO | Admitting: Gastroenterology

## 2021-09-18 ENCOUNTER — Encounter: Payer: Self-pay | Admitting: Gastroenterology

## 2021-09-18 VITALS — BP 129/73 | HR 98 | Temp 97.3°F | Ht 65.0 in | Wt 203.0 lb

## 2021-09-18 DIAGNOSIS — K219 Gastro-esophageal reflux disease without esophagitis: Secondary | ICD-10-CM | POA: Diagnosis not present

## 2021-09-18 DIAGNOSIS — K922 Gastrointestinal hemorrhage, unspecified: Secondary | ICD-10-CM | POA: Diagnosis not present

## 2021-09-18 DIAGNOSIS — Z86010 Personal history of colonic polyps: Secondary | ICD-10-CM | POA: Diagnosis not present

## 2021-09-18 DIAGNOSIS — K7030 Alcoholic cirrhosis of liver without ascites: Secondary | ICD-10-CM

## 2021-09-18 MED ORDER — PEG 3350-KCL-NA BICARB-NACL 420 G PO SOLR: 4000.0000 mL | ORAL | 0 refills | Status: DC

## 2021-09-18 NOTE — Patient Instructions (Addendum)
We are arranging a colonoscopy in the near future due to history of rectal bleeding and polyps.  No metformin or glipizide the day of the procedure. Stop iron 7 days before the procedure.   Please call if any further bleeding!  You can decrease lactulose down to once daily. Please call if persistent gas/bloating, or unable to tolerate.  We have arranged a routine ultrasound of your liver.  We will see you in 2 months!  I enjoyed seeing you again today! As you know, I value our relationship and want to provide genuine, compassionate, and quality care. I welcome your feedback. If you receive a survey regarding your visit,  I greatly appreciate you taking time to fill this out. See you next time!  Annitta Needs, PhD, ANP-BC [facility]

## 2021-09-18 NOTE — H&P (View-Only) (Signed)
Referring Provider: Lindell Spar, MD Primary Care Physician:  Lindell Spar, MD Primary GI: Dr. Gala Romney  Chief Complaint  Patient presents with   Cirrhosis    F/u. Taking lactulose BID, bowels are about BID   Gastroesophageal Reflux    Doing okay. Changed diet   ugi bleed    Hospitalized 09/02/21. Stopped drinking alcohol    HPI:   Randy Hebert is a 56 y.o. 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 Received blood transfusion. EGD while inpatient with three columns of Grade 1 varices in distal esophagus but no stigmata of bleeding or red wale signs. Small hiatal hernia. Mild portal gastropathy. 1 year surveillance needed. He is here in hospital follow-up and to discuss colonoscopy due to rectal bleeding while inpatient and now source of bleeding found. Last colonoscopy in 2018 with tubular adenomas, diverticulosis. Concern for encephalopathy during admission and now on lactulose. Discharge Hgb 8.9.   Woke up with sore throat and cough. Scratchy cough. No fever. No ETOH for a week prior to hospitalization. Continues to remain abstinent. No further rectal bleeding. Denies weakness and fatigue. Changed his diet. Feels more energetic. On Trazodone now due to difficulty  sleeping. Lactulose BID. Burping, lots of gas.    RUQ [place] due now.   Past Medical History:  Diagnosis Date   ACE inhibitor-aggravated angioedema    Allergy    Angio-edema    Anxiety    Arthritis    Asthma    hip replacement   Back pain    Bradycardia 04/28/2017   Bulging of cervical intervertebral disc    CAD (coronary artery disease)    lateral STEMI 02/06/2015 00% D1 occlusion treated with Promus Premier 2.5 mm x 16 mm DES, 70% ramus stenosis, 40% mid RCA stenosis, 45% distal RCA stenosis, EF 45-50%   CHF (congestive heart failure) (HCC)    COPD (chronic obstructive pulmonary disease) ([place])    Depression    Diabetes  mellitus without complication ([place])    Difficult intubation    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   Dry eye    Dyspnea    Early satiety 09/23/2016   GERD (gastroesophageal reflux disease)    HCAP (healthcare-associated pneumonia) 05/15/2017   Headache    Heart murmur    Hip pain    Hyperlipidemia    Hypertension    Lobar pneumonia ([place]) 05/15/2017   Melena 08/04/2018   MI (myocardial infarction) ([place])    Myocardial infarction ([place])    Neck pain    Non-ST elevation (NSTEMI) myocardial infarction ([place]) 04/27/2017   NSTEMI (non-ST elevated myocardial infarction) ([place]) 04/26/2017   Otitis media    Pleurisy    Pneumonia due to COVID-19 virus 10/07/2019   Rectal bleeding 11/08/2018   Right shoulder pain 03/20/2020   Sinus pause    9 sec sinus pause on telemetry after started on coreg after MI, avoid AV nodal blocking agent   Sleep apnea    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   STEMI (ST elevation myocardial infarction) ([place]) 05/31/2015   Substance abuse ([place]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   Syncope 41/32/4401   Syncope and collapse 05/15/2017   Transaminitis 08/04/2018   Unilateral primary osteoarthritis, right hip 07/01/2016    Past Surgical History:  Procedure Laterality Date   BIOPSY  10/09/2016   Procedure: BIOPSY;  Surgeon: Daneil Dolin,  MD;  [facility];  Service: Endoscopy;;   BIOPSY  11/28/2019   Procedure: BIOPSY;  Surgeon: Daneil Dolin, MD;  [facility];  Service: Endoscopy;;   CARDIAC CATHETERIZATION N/A 02/06/2015   Procedure: Left Heart Cath and Coronary Angiography;  Surgeon: Leonie Man, MD;  Location: [facility];  Service: Cardiovascular;  Laterality: N/A;   CARDIAC CATHETERIZATION N/A 02/06/2015   Procedure: Coronary Stent Intervention;  Surgeon: Leonie Man, MD;  Location: [facility];  Service: Cardiovascular;  Laterality: N/A;   COLONOSCOPY WITH PROPOFOL N/A 10/09/2016   Sigmoid and descending colon  diverticulosis, four 4-6 mm polyps in sigmoid, one 4 mm polyp in descending. Tubular adenomas and hyperplastic. 5 year surveillance.    COLONOSCOPY WITH PROPOFOL N/A 11/24/2016   Sigmoid and descending colon diverticulosis, four 4-6 mm polyps in sigmoid, one 4 mm polyp in descending. Tubular adenomas and hyperplastic. 5 year surveillance.    CORONARY ANGIOPLASTY WITH STENT PLACEMENT  01/2015   CORONARY STENT INTERVENTION N/A 04/27/2017   Procedure: CORONARY STENT INTERVENTION;  Surgeon: Nelva Bush, MD;  Location: [facility];  Service: Cardiovascular;  Laterality: N/A;   ELECTROPHYSIOLOGY STUDY N/A 06/29/2017   Procedure: ELECTROPHYSIOLOGY STUDY;  Surgeon: Evans Lance, MD;  Location: [facility];  Service: Cardiovascular;  Laterality: N/A;   ESOPHAGOGASTRODUODENOSCOPY (EGD) WITH PROPOFOL N/A 10/09/2016   Dr. Gala Romney: LA grade a esophagitis.  Barrett's esophagus, biopsy-proven.  Small hiatal hernia.  EGD February 2019   ESOPHAGOGASTRODUODENOSCOPY (EGD) WITH PROPOFOL N/A 11/28/2019    salmon-colored esophageal mucosa (Barrett's) small hiatal hernia, portal hypertensive gastropathy, normal duodenum, 3 year surveillance   ESOPHAGOGASTRODUODENOSCOPY (EGD) WITH PROPOFOL N/A 09/06/2021   three columns of grade 1 varices in distal esophagus, no stigmata of bleeding or red wale signs. Small hiatal hernia. Mild portal gastropathy. Normal duodenum. Repeat upper endoscopy in 1 year for surveillance.   INCISION / DRAINAGE HAND / FINGER     LEFT HEART CATH AND CORONARY ANGIOGRAPHY N/A 04/27/2017   Procedure: LEFT HEART CATH AND CORONARY ANGIOGRAPHY;  Surgeon: Nelva Bush, MD;  Location: [facility];  Service: Cardiovascular;  Laterality: N/A;   LOOP RECORDER INSERTION  06/29/2017   Procedure: Loop Recorder Insertion;  Surgeon: Evans Lance, MD;  Location: [facility];  Service: Cardiovascular;;   POLYPECTOMY  11/24/2016   Procedure: POLYPECTOMY;  Surgeon: Daneil Dolin, MD;  [facility];  Service: Endoscopy;;  descending and sigmoid   TOTAL HIP ARTHROPLASTY Right 07/01/2016   TOTAL HIP ARTHROPLASTY Right 07/01/2016   Procedure: RIGHT TOTAL HIP ARTHROPLASTY ANTERIOR APPROACH;  Surgeon: Mcarthur Rossetti, MD;  Location: [facility];  Service: Orthopedics;  Laterality: Right;    Current Outpatient Medications  Medication Sig Dispense Refill   acetaminophen (TYLENOL) 500 MG tablet Take 2 tablets (1,000 mg total) by mouth every 8 (eight) hours as needed for mild pain or fever.     albuterol (PROVENTIL) (2.5 MG/3ML) 0.083% nebulizer solution Take 3 mLs (2.5 mg total) by nebulization every 6 (six) hours as needed for wheezing or shortness of breath. 150 mL 1   albuterol (VENTOLIN HFA) 108 (90 Base) MCG/ACT inhaler INHALE 2 PUFFS INTO THE LUNGS EVERY 6 HOURS AS NEEDEDFOR SHORTNESS OF BREATH OR WHEEZING. 18 g 1   ascorbic acid (VITAMIN C) 500 MG tablet Take 1 tablet (500 mg total) by mouth daily. 30 tablet 1   aspirin 81 MG EC tablet Take 1  tablet (81 mg total) by mouth daily. (Patient taking differently: Take 81 mg by mouth in the morning and at bedtime.) 30 tablet    Azelastine HCl 137 MCG/SPRAY SOLN USE 2 SPRAYS IN EACH NOSTRIL TWICE DAILY. 30 mL 0   Cholecalciferol (VITAMIN D3) 125 MCG (5000 UT) CAPS Take 1 capsule (5,000 Units total) by mouth daily. 90 capsule 0   citalopram (CELEXA) 20 MG tablet Take 20 mg by mouth daily.     cycloSPORINE (RESTASIS) 0.05 % ophthalmic emulsion Place 1 drop into both eyes 2 (two) times daily.     dapagliflozin propanediol (FARXIGA) 10 MG TABS tablet Take 1 tablet (10 mg total) by mouth daily before breakfast. 30 tablet 2   ferrous sulfate 325 (65 FE) MG tablet Take 1 tablet (325 mg total) by mouth daily with breakfast. 90 tablet 1   Fluticasone-Umeclidin-Vilant (TRELEGY ELLIPTA) 100-62.5-25 MCG/ACT AEPB Inhale 1 puff into the lungs daily. 1 each 0   furosemide (LASIX) 40 MG tablet Take 1 tablet (40 mg total) by  mouth daily as needed for fluid. 30 tablet 11   glipiZIDE (GLUCOTROL XL) 5 MG 24 hr tablet TAKE 1 TABLET BY MOUTH DAILY WITH BREAKFAST. 90 tablet 0   glucose blood (ACCU-CHEK AVIVA PLUS) test strip Use as instructed to check blood glucose three times daily. 100 each 2   lactulose (CHRONULAC) 10 GM/15ML solution Take 30 mLs (20 g total) by mouth 2 (two) times daily. 1800 mL 0   magnesium gluconate (MAGONATE) 500 MG tablet Take 500 mg by mouth 2 (two) times daily.     metFORMIN (GLUCOPHAGE) 500 MG tablet Take 1 tablet (500 mg total) by mouth 2 (two) times daily with a meal. 60 tablet 2   mometasone (ELOCON) 0.1 % cream Apply 1 application topically 2 (two) times a week.      nitroGLYCERIN (NITROSTAT) 0.4 MG SL tablet Place 1 tablet (0.4 mg total) under the tongue every 5 (five) minutes as needed for chest pain. 25 tablet 3   olopatadine (PATANOL) 0.1 % ophthalmic solution 1 drop 2 (two) times daily.     pantoprazole (PROTONIX) 40 MG tablet TAKE (1) TABLET BY MOUTH TWICE DAILY BEFORE A MEAL. 60 tablet 5   prednisoLONE acetate (PRED FORTE) 1 % ophthalmic suspension Place 4 drops into both eyes 2 (two) times a week.      traZODone (DESYREL) 50 MG tablet Take 0.5-1 tablets (25-50 mg total) by mouth at bedtime as needed for sleep. 30 tablet 3   triamcinolone ointment (KENALOG) 0.1 % Apply 1 application topically 2 (two) times daily. 454 g 1   amLODipine (NORVASC) 10 MG tablet Take 1 tablet (10 mg total) by mouth daily. 90 tablet 0   atorvastatin (LIPITOR) 40 MG tablet TAKE 1 TABLET BY MOUTH ONCE A DAY. 90 tablet 1   GAVILYTE-G 236 g solution SMARTSIG:Milliliter(s) By Mouth     polyethylene glycol-electrolytes (TRILYTE) 420 g solution Take 4,000 mLs by mouth as directed. 4000 mL 0   sulfamethoxazole-trimethoprim (BACTRIM DS) 800-160 MG tablet Take 1 tablet by mouth 2 (two) times daily. 14 tablet 0   TRULICITY 1.5 EH/6.3JS SOPN INJECT 1 1/2 MG INTO THE SKIN ONCE WEEKLY. 2 mL 2   No current  facility-administered medications for this visit.    Allergies as of 09/18/2021 - Review Complete 09/18/2021  Allergen Reaction Noted   Carvedilol Other (See Comments) 02/08/2015   Lisinopril Anaphylaxis, Shortness Of Breath, and Swelling 05/30/2015   Chantix [varenicline] Other (See Comments) 07/16/2021  Amoxicillin Nausea And Vomiting and Nausea Only 02/06/2015    Family History  Problem Relation Age of Onset   Heart attack Father    Stroke Father    Arthritis Father    Heart disease Father    Cancer Mother        ???   Arthritis Mother    Heart disease Brother 10       died in sleep   Early death Brother    Diabetes Maternal Uncle    Alzheimer's disease Maternal Grandmother     Social History   Socioeconomic History   Marital status: Divorced    Spouse name: alice   Number of children: 3   Years of education: 12   Highest education level: Not on file  Occupational History   Occupation: SSI  Tobacco Use   Smoking status: Every Day    Packs/day: 0.50    Years: 46.00    Pack years: 23.00    Types: Cigarettes   Smokeless tobacco: Former    Types: Nurse, children's Use: Former  Substance and Sexual Activity   Alcohol use: Not Currently    Comment: couple beers occ- stopped 09/2021   Drug use: No    Comment: history of drug use- marijuana, cocaine- quit 2014   Sexual activity: Yes    Partners: Female    Birth control/protection: None  Other Topics Concern   Not on file  Social History Narrative   Lives with girlfriend [name]   3 children, but 1 OD  In 2019.   Grandchildren-2      Two cats: may and princess; and a dog-foxy      Enjoy: spending time with pets, TV, yard work       Diet: eats all food groups   Caffeine: half and half coffee, some tea-half and half decaf   Water: 6-8 cups daily      Wears seat belt   Does not use phone while driving   Oceanographer at home      Social Determinants of Health   Financial Resource Strain: Low  Risk    Difficulty of Paying Living Expenses: Not hard at all  Food Insecurity: No Food Insecurity   Worried About Charity fundraiser in the Last Year: Never true   Arboriculturist in the Last Year: Never true  Transportation Needs: No Transportation Needs   Lack of Transportation (Medical): No   Lack of Transportation (Non-Medical): No  Physical Activity: Insufficiently Active   Days of Exercise per Week: 3 days   Minutes of Exercise per Session: 20 min  Stress: No Stress Concern Present   Feeling of Stress : Not at all  Social Connections: Moderately Integrated   Frequency of Communication with Friends and Family: More than three times a week   Frequency of Social Gatherings with Friends and Family: More than three times a week   Attends Religious Services: More than 4 times per year   Active Member of [employer] or Organizations: Yes   Attends Music therapist: More than 4 times per year   Marital Status: Divorced    Review of Systems: Gen: Denies fever, chills, anorexia. Denies fatigue, weakness, weight loss.  CV: Denies chest pain, palpitations, syncope, peripheral edema, and claudication. Resp: Denies dyspnea at rest, cough, wheezing, coughing up blood, and pleurisy. GI: see HPI Derm: Denies rash, itching, dry skin Psych: Denies depression, anxiety, memory loss, confusion. No homicidal or  suicidal ideation.  Heme: Denies bruising, bleeding, and enlarged lymph nodes.  Physical Exam: BP 129/73    Pulse 98    Temp (!) 97.3 F (36.3 C)    Ht 5\' 5"  (1.651 m)    Wt 203 lb (92.1 kg)    BMI 33.78 kg/m  General:   Alert and oriented. No distress noted. Pleasant and cooperative.  Head:  Normocephalic and atraumatic. Eyes:  Conjuctiva clear without scleral icterus. Mouth:  mask in place Abdomen:  +BS, soft, liver margin palpable and non-distended. No rebound or guarding. No HSM or masses noted. Msk:  Symmetrical without gross deformities. Normal posture. Extremities:   Without edema. Neurologic:  Alert and  oriented x4 Psych:  Alert and cooperative. Normal mood and affect.  ASSESSMENT: ABDO DENAULT is a 56 y.o. 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 Known diverticulosis on colonoscopy in 2018. History of adenomas. Here for follow-up.  History of GI bleeding: will pursue colonoscopy. Query a low-volume source, ?diverticular bleeding. Discharge Hgb 8.9 and will recheck now.   Cirrhosis: due to ETOH. He has been abstinent since prior to admission. [place] due now. EGD in 1 year.   Burping/gas: on lactulose BID due to concern for encephalopathy while inpatient. I have asked thathe trial this once to twice daily. No mental status changes or confusion.   GERD: with history of Barrett's. Continue Protonix BID.     PLAN:  Proceed with colonoscopy by Dr. Abbey Chatters  in near future: the risks, benefits, and alternatives have been discussed with the patient in detail. The patient states understanding and desires to proceed.   Hold iron 7 days prior  RUQ [place]  Titrate lactulose: call if persistent gas  PPI BID  2 month close follow-up   Annitta Needs, PhD, ANP-BC [facility]

## 2021-09-18 NOTE — Progress Notes (Signed)
Referring Provider: Lindell Spar, MD Primary Care Physician:  Lindell Spar, MD Primary GI: Dr. Gala Romney  Chief Complaint  Patient presents with   Cirrhosis    F/u. Taking lactulose BID, bowels are about BID   Gastroesophageal Reflux    Doing okay. Changed diet   ugi bleed    Hospitalized 09/02/21. Stopped drinking alcohol    HPI:   Randy Hebert is a 56 y.o. 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 Received blood transfusion. EGD while inpatient with three columns of Grade 1 varices in distal esophagus but no stigmata of bleeding or red wale signs. Small hiatal hernia. Mild portal gastropathy. 1 year surveillance needed. He is here in hospital follow-up and to discuss colonoscopy due to rectal bleeding while inpatient and now source of bleeding found. Last colonoscopy in 2018 with tubular adenomas, diverticulosis. Concern for encephalopathy during admission and now on lactulose. Discharge Hgb 8.9.   Woke up with sore throat and cough. Scratchy cough. No fever. No ETOH for a week prior to hospitalization. Continues to remain abstinent. No further rectal bleeding. Denies weakness and fatigue. Changed his diet. Feels more energetic. On Trazodone now due to difficulty  sleeping. Lactulose BID. Burping, lots of gas.    RUQ [place] due now.   Past Medical History:  Diagnosis Date   ACE inhibitor-aggravated angioedema    Allergy    Angio-edema    Anxiety    Arthritis    Asthma    hip replacement   Back pain    Bradycardia 04/28/2017   Bulging of cervical intervertebral disc    CAD (coronary artery disease)    lateral STEMI 02/06/2015 00% D1 occlusion treated with Promus Premier 2.5 mm x 16 mm DES, 70% ramus stenosis, 40% mid RCA stenosis, 45% distal RCA stenosis, EF 45-50%   CHF (congestive heart failure) (HCC)    COPD (chronic obstructive pulmonary disease) ([place])    Depression    Diabetes  mellitus without complication ([place])    Difficult intubation    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   Dry eye    Dyspnea    Early satiety 09/23/2016   GERD (gastroesophageal reflux disease)    HCAP (healthcare-associated pneumonia) 05/15/2017   Headache    Heart murmur    Hip pain    Hyperlipidemia    Hypertension    Lobar pneumonia ([place]) 05/15/2017   Melena 08/04/2018   MI (myocardial infarction) ([place])    Myocardial infarction ([place])    Neck pain    Non-ST elevation (NSTEMI) myocardial infarction ([place]) 04/27/2017   NSTEMI (non-ST elevated myocardial infarction) ([place]) 04/26/2017   Otitis media    Pleurisy    Pneumonia due to COVID-19 virus 10/07/2019   Rectal bleeding 11/08/2018   Right shoulder pain 03/20/2020   Sinus pause    9 sec sinus pause on telemetry after started on coreg after MI, avoid AV nodal blocking agent   Sleep apnea    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   STEMI (ST elevation myocardial infarction) ([place]) 05/31/2015   Substance abuse ([place]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   Syncope 00/93/8182   Syncope and collapse 05/15/2017   Transaminitis 08/04/2018   Unilateral primary osteoarthritis, right hip 07/01/2016    Past Surgical History:  Procedure Laterality Date   BIOPSY  10/09/2016   Procedure: BIOPSY;  Surgeon: Daneil Dolin,  MD;  [facility];  Service: Endoscopy;;   BIOPSY  11/28/2019   Procedure: BIOPSY;  Surgeon: Daneil Dolin, MD;  [facility];  Service: Endoscopy;;   CARDIAC CATHETERIZATION N/A 02/06/2015   Procedure: Left Heart Cath and Coronary Angiography;  Surgeon: Leonie Man, MD;  Location: [facility];  Service: Cardiovascular;  Laterality: N/A;   CARDIAC CATHETERIZATION N/A 02/06/2015   Procedure: Coronary Stent Intervention;  Surgeon: Leonie Man, MD;  Location: [facility];  Service: Cardiovascular;  Laterality: N/A;   COLONOSCOPY WITH PROPOFOL N/A 10/09/2016   Sigmoid and descending colon  diverticulosis, four 4-6 mm polyps in sigmoid, one 4 mm polyp in descending. Tubular adenomas and hyperplastic. 5 year surveillance.    COLONOSCOPY WITH PROPOFOL N/A 11/24/2016   Sigmoid and descending colon diverticulosis, four 4-6 mm polyps in sigmoid, one 4 mm polyp in descending. Tubular adenomas and hyperplastic. 5 year surveillance.    CORONARY ANGIOPLASTY WITH STENT PLACEMENT  01/2015   CORONARY STENT INTERVENTION N/A 04/27/2017   Procedure: CORONARY STENT INTERVENTION;  Surgeon: Nelva Bush, MD;  Location: [facility];  Service: Cardiovascular;  Laterality: N/A;   ELECTROPHYSIOLOGY STUDY N/A 06/29/2017   Procedure: ELECTROPHYSIOLOGY STUDY;  Surgeon: Evans Lance, MD;  Location: [facility];  Service: Cardiovascular;  Laterality: N/A;   ESOPHAGOGASTRODUODENOSCOPY (EGD) WITH PROPOFOL N/A 10/09/2016   Dr. Gala Romney: LA grade a esophagitis.  Barrett's esophagus, biopsy-proven.  Small hiatal hernia.  EGD February 2019   ESOPHAGOGASTRODUODENOSCOPY (EGD) WITH PROPOFOL N/A 11/28/2019    salmon-colored esophageal mucosa (Barrett's) small hiatal hernia, portal hypertensive gastropathy, normal duodenum, 3 year surveillance   ESOPHAGOGASTRODUODENOSCOPY (EGD) WITH PROPOFOL N/A 09/06/2021   three columns of grade 1 varices in distal esophagus, no stigmata of bleeding or red wale signs. Small hiatal hernia. Mild portal gastropathy. Normal duodenum. Repeat upper endoscopy in 1 year for surveillance.   INCISION / DRAINAGE HAND / FINGER     LEFT HEART CATH AND CORONARY ANGIOGRAPHY N/A 04/27/2017   Procedure: LEFT HEART CATH AND CORONARY ANGIOGRAPHY;  Surgeon: Nelva Bush, MD;  Location: [facility];  Service: Cardiovascular;  Laterality: N/A;   LOOP RECORDER INSERTION  06/29/2017   Procedure: Loop Recorder Insertion;  Surgeon: Evans Lance, MD;  Location: [facility];  Service: Cardiovascular;;   POLYPECTOMY  11/24/2016   Procedure: POLYPECTOMY;  Surgeon: Daneil Dolin, MD;  [facility];  Service: Endoscopy;;  descending and sigmoid   TOTAL HIP ARTHROPLASTY Right 07/01/2016   TOTAL HIP ARTHROPLASTY Right 07/01/2016   Procedure: RIGHT TOTAL HIP ARTHROPLASTY ANTERIOR APPROACH;  Surgeon: Mcarthur Rossetti, MD;  Location: [facility];  Service: Orthopedics;  Laterality: Right;    Current Outpatient Medications  Medication Sig Dispense Refill   acetaminophen (TYLENOL) 500 MG tablet Take 2 tablets (1,000 mg total) by mouth every 8 (eight) hours as needed for mild pain or fever.     albuterol (PROVENTIL) (2.5 MG/3ML) 0.083% nebulizer solution Take 3 mLs (2.5 mg total) by nebulization every 6 (six) hours as needed for wheezing or shortness of breath. 150 mL 1   albuterol (VENTOLIN HFA) 108 (90 Base) MCG/ACT inhaler INHALE 2 PUFFS INTO THE LUNGS EVERY 6 HOURS AS NEEDEDFOR SHORTNESS OF BREATH OR WHEEZING. 18 g 1   ascorbic acid (VITAMIN C) 500 MG tablet Take 1 tablet (500 mg total) by mouth daily. 30 tablet 1   aspirin 81 MG EC tablet Take 1  tablet (81 mg total) by mouth daily. (Patient taking differently: Take 81 mg by mouth in the morning and at bedtime.) 30 tablet    Azelastine HCl 137 MCG/SPRAY SOLN USE 2 SPRAYS IN EACH NOSTRIL TWICE DAILY. 30 mL 0   Cholecalciferol (VITAMIN D3) 125 MCG (5000 UT) CAPS Take 1 capsule (5,000 Units total) by mouth daily. 90 capsule 0   citalopram (CELEXA) 20 MG tablet Take 20 mg by mouth daily.     cycloSPORINE (RESTASIS) 0.05 % ophthalmic emulsion Place 1 drop into both eyes 2 (two) times daily.     dapagliflozin propanediol (FARXIGA) 10 MG TABS tablet Take 1 tablet (10 mg total) by mouth daily before breakfast. 30 tablet 2   ferrous sulfate 325 (65 FE) MG tablet Take 1 tablet (325 mg total) by mouth daily with breakfast. 90 tablet 1   Fluticasone-Umeclidin-Vilant (TRELEGY ELLIPTA) 100-62.5-25 MCG/ACT AEPB Inhale 1 puff into the lungs daily. 1 each 0   furosemide (LASIX) 40 MG tablet Take 1 tablet (40 mg total) by  mouth daily as needed for fluid. 30 tablet 11   glipiZIDE (GLUCOTROL XL) 5 MG 24 hr tablet TAKE 1 TABLET BY MOUTH DAILY WITH BREAKFAST. 90 tablet 0   glucose blood (ACCU-CHEK AVIVA PLUS) test strip Use as instructed to check blood glucose three times daily. 100 each 2   lactulose (CHRONULAC) 10 GM/15ML solution Take 30 mLs (20 g total) by mouth 2 (two) times daily. 1800 mL 0   magnesium gluconate (MAGONATE) 500 MG tablet Take 500 mg by mouth 2 (two) times daily.     metFORMIN (GLUCOPHAGE) 500 MG tablet Take 1 tablet (500 mg total) by mouth 2 (two) times daily with a meal. 60 tablet 2   mometasone (ELOCON) 0.1 % cream Apply 1 application topically 2 (two) times a week.      nitroGLYCERIN (NITROSTAT) 0.4 MG SL tablet Place 1 tablet (0.4 mg total) under the tongue every 5 (five) minutes as needed for chest pain. 25 tablet 3   olopatadine (PATANOL) 0.1 % ophthalmic solution 1 drop 2 (two) times daily.     pantoprazole (PROTONIX) 40 MG tablet TAKE (1) TABLET BY MOUTH TWICE DAILY BEFORE A MEAL. 60 tablet 5   prednisoLONE acetate (PRED FORTE) 1 % ophthalmic suspension Place 4 drops into both eyes 2 (two) times a week.      traZODone (DESYREL) 50 MG tablet Take 0.5-1 tablets (25-50 mg total) by mouth at bedtime as needed for sleep. 30 tablet 3   triamcinolone ointment (KENALOG) 0.1 % Apply 1 application topically 2 (two) times daily. 454 g 1   amLODipine (NORVASC) 10 MG tablet Take 1 tablet (10 mg total) by mouth daily. 90 tablet 0   atorvastatin (LIPITOR) 40 MG tablet TAKE 1 TABLET BY MOUTH ONCE A DAY. 90 tablet 1   GAVILYTE-G 236 g solution SMARTSIG:Milliliter(s) By Mouth     polyethylene glycol-electrolytes (TRILYTE) 420 g solution Take 4,000 mLs by mouth as directed. 4000 mL 0   sulfamethoxazole-trimethoprim (BACTRIM DS) 800-160 MG tablet Take 1 tablet by mouth 2 (two) times daily. 14 tablet 0   TRULICITY 1.5 UX/3.2GM SOPN INJECT 1 1/2 MG INTO THE SKIN ONCE WEEKLY. 2 mL 2   No current  facility-administered medications for this visit.    Allergies as of 09/18/2021 - Review Complete 09/18/2021  Allergen Reaction Noted   Carvedilol Other (See Comments) 02/08/2015   Lisinopril Anaphylaxis, Shortness Of Breath, and Swelling 05/30/2015   Chantix [varenicline] Other (See Comments) 07/16/2021  Amoxicillin Nausea And Vomiting and Nausea Only 02/06/2015    Family History  Problem Relation Age of Onset   Heart attack Father    Stroke Father    Arthritis Father    Heart disease Father    Cancer Mother        ???   Arthritis Mother    Heart disease Brother 44       died in sleep   Early death Brother    Diabetes Maternal Uncle    Alzheimer's disease Maternal Grandmother     Social History   Socioeconomic History   Marital status: Divorced    Spouse name: alice   Number of children: 3   Years of education: 12   Highest education level: Not on file  Occupational History   Occupation: SSI  Tobacco Use   Smoking status: Every Day    Packs/day: 0.50    Years: 46.00    Pack years: 23.00    Types: Cigarettes   Smokeless tobacco: Former    Types: Nurse, children's Use: Former  Substance and Sexual Activity   Alcohol use: Not Currently    Comment: couple beers occ- stopped 09/2021   Drug use: No    Comment: history of drug use- marijuana, cocaine- quit 2014   Sexual activity: Yes    Partners: Female    Birth control/protection: None  Other Topics Concern   Not on file  Social History Narrative   Lives with girlfriend [name]   3 children, but 1 OD  In 2019.   Grandchildren-2      Two cats: may and princess; and a dog-foxy      Enjoy: spending time with pets, TV, yard work       Diet: eats all food groups   Caffeine: half and half coffee, some tea-half and half decaf   Water: 6-8 cups daily      Wears seat belt   Does not use phone while driving   Oceanographer at home      Social Determinants of Health   Financial Resource Strain: Low  Risk    Difficulty of Paying Living Expenses: Not hard at all  Food Insecurity: No Food Insecurity   Worried About Charity fundraiser in the Last Year: Never true   Arboriculturist in the Last Year: Never true  Transportation Needs: No Transportation Needs   Lack of Transportation (Medical): No   Lack of Transportation (Non-Medical): No  Physical Activity: Insufficiently Active   Days of Exercise per Week: 3 days   Minutes of Exercise per Session: 20 min  Stress: No Stress Concern Present   Feeling of Stress : Not at all  Social Connections: Moderately Integrated   Frequency of Communication with Friends and Family: More than three times a week   Frequency of Social Gatherings with Friends and Family: More than three times a week   Attends Religious Services: More than 4 times per year   Active Member of [employer] or Organizations: Yes   Attends Music therapist: More than 4 times per year   Marital Status: Divorced    Review of Systems: Gen: Denies fever, chills, anorexia. Denies fatigue, weakness, weight loss.  CV: Denies chest pain, palpitations, syncope, peripheral edema, and claudication. Resp: Denies dyspnea at rest, cough, wheezing, coughing up blood, and pleurisy. GI: see HPI Derm: Denies rash, itching, dry skin Psych: Denies depression, anxiety, memory loss, confusion. No homicidal or  suicidal ideation.  Heme: Denies bruising, bleeding, and enlarged lymph nodes.  Physical Exam: BP 129/73    Pulse 98    Temp (!) 97.3 F (36.3 C)    Ht 5\' 5"  (1.651 m)    Wt 203 lb (92.1 kg)    BMI 33.78 kg/m  General:   Alert and oriented. No distress noted. Pleasant and cooperative.  Head:  Normocephalic and atraumatic. Eyes:  Conjuctiva clear without scleral icterus. Mouth:  mask in place Abdomen:  +BS, soft, liver margin palpable and non-distended. No rebound or guarding. No HSM or masses noted. Msk:  Symmetrical without gross deformities. Normal posture. Extremities:   Without edema. Neurologic:  Alert and  oriented x4 Psych:  Alert and cooperative. Normal mood and affect.  ASSESSMENT: Randy Hebert is a 56 y.o. 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 Known diverticulosis on colonoscopy in 2018. History of adenomas. Here for follow-up.  History of GI bleeding: will pursue colonoscopy. Query a low-volume source, ?diverticular bleeding. Discharge Hgb 8.9 and will recheck now.   Cirrhosis: due to ETOH. He has been abstinent since prior to admission. [place] due now. EGD in 1 year.   Burping/gas: on lactulose BID due to concern for encephalopathy while inpatient. I have asked thathe trial this once to twice daily. No mental status changes or confusion.   GERD: with history of Barrett's. Continue Protonix BID.     PLAN:  Proceed with colonoscopy by Dr. Abbey Chatters  in near future: the risks, benefits, and alternatives have been discussed with the patient in detail. The patient states understanding and desires to proceed.   Hold iron 7 days prior  RUQ [place]  Titrate lactulose: call if persistent gas  PPI BID  2 month close follow-up   Annitta Needs, PhD, ANP-BC [facility]

## 2021-09-18 NOTE — Telephone Encounter (Signed)
Tried to call pt to schedule TCS w/Propofol ASA 3 w/Dr. Abbey Chatters, LMOVM for return call.

## 2021-09-18 NOTE — Telephone Encounter (Signed)
Spoke to pt, TCS scheduled for 10/18/21 at 7:30am. Rx for prep sent to pharmacy. Orders entered.   PA for TCS submitted via [facility] website. PA pended. Tracking# [id number].

## 2021-09-19 NOTE — Telephone Encounter (Signed)
Pre-op appt 10/16/21 at 9:00am. Appt letter mailed with procedure instructions.

## 2021-09-23 ENCOUNTER — Other Ambulatory Visit: Payer: Self-pay | Admitting: Internal Medicine

## 2021-09-23 ENCOUNTER — Other Ambulatory Visit: Payer: Self-pay

## 2021-09-23 DIAGNOSIS — E782 Mixed hyperlipidemia: Secondary | ICD-10-CM

## 2021-09-23 NOTE — Telephone Encounter (Signed)
Previous PA request cancelled per [facility] website (unable to update case).  New PA for TCS submitted via [facility] website. Case approved. PA# [id number], valid 10/18/21-01/16/22.

## 2021-09-25 NOTE — Chronic Care Management (AMB) (Signed)
°  Care Management   Note  09/25/2021 Name: Randy Hebert MRN: [id number] DOB: March 23, 1966  Randy Hebert is a 56 y.o. year old male who is a primary care patient of Lindell Spar, MD and is actively engaged with the care management team. I reached out to [name] by phone today to assist with re-scheduling a follow up visit with the Pharmacist  Follow up plan: Telephone appointment with care management team member scheduled for:10/16/21  [facility]  Direct Dial: [phone]

## 2021-09-30 ENCOUNTER — Encounter: Payer: Self-pay | Admitting: Nurse Practitioner

## 2021-09-30 ENCOUNTER — Ambulatory Visit (HOSPITAL_COMMUNITY)
Admission: RE | Admit: 2021-09-30 | Discharge: 2021-09-30 | Disposition: A | Discharge status: Home / Self Care | Payer: Medicare Other | Source: Ambulatory Visit | Attending: Gastroenterology | Admitting: Gastroenterology

## 2021-09-30 ENCOUNTER — Ambulatory Visit (INDEPENDENT_AMBULATORY_CARE_PROVIDER_SITE_OTHER): Payer: Medicare Other | Admitting: Nurse Practitioner

## 2021-09-30 ENCOUNTER — Other Ambulatory Visit: Payer: Self-pay

## 2021-09-30 VITALS — BP 118/76 | HR 80 | Temp 98.4°F | Ht 65.0 in | Wt 202.0 lb

## 2021-09-30 DIAGNOSIS — I10 Essential (primary) hypertension: Secondary | ICD-10-CM | POA: Diagnosis not present

## 2021-09-30 DIAGNOSIS — K7030 Alcoholic cirrhosis of liver without ascites: Secondary | ICD-10-CM | POA: Diagnosis present

## 2021-09-30 DIAGNOSIS — H60392 Other infective otitis externa, left ear: Secondary | ICD-10-CM | POA: Diagnosis not present

## 2021-09-30 DIAGNOSIS — J449 Chronic obstructive pulmonary disease, unspecified: Secondary | ICD-10-CM | POA: Diagnosis not present

## 2021-09-30 DIAGNOSIS — K824 Cholesterolosis of gallbladder: Secondary | ICD-10-CM | POA: Diagnosis not present

## 2021-09-30 DIAGNOSIS — K7460 Unspecified cirrhosis of liver: Secondary | ICD-10-CM | POA: Diagnosis not present

## 2021-09-30 MED ORDER — AMLODIPINE BESYLATE 10 MG PO TABS: 10.0000 mg | ORAL_TABLET | Freq: Every day | ORAL | 0 refills | Status: DC

## 2021-09-30 MED ORDER — SULFAMETHOXAZOLE-TRIMETHOPRIM 800-160 MG PO TABS: 1.0000 | ORAL_TABLET | Freq: Two times a day (BID) | ORAL | 0 refills | Status: DC

## 2021-09-30 NOTE — Assessment & Plan Note (Addendum)
Continue albuterol prn, trelegy daily, recently treated for COPD exacerbation, and referred to pulmonology.   uses oxygen 4lts at night

## 2021-09-30 NOTE — Assessment & Plan Note (Signed)
RX septra DS . BID for seven day .  Keep site clean and dry. Was around site with gentle soap and water.

## 2021-09-30 NOTE — Progress Notes (Signed)
° °  Randy Hebert     MRN: [id number]      DOB: May 18, 1966   HPI Randy Hebert is here for c/o of left ear infection since 5 days ago. Inner ear feels sore and itchy, he can hear pretty good. Ear lobe is swollen and painful, he stated that he initially had a bump on the affected ear and he used a niddle  to poke the bump. States that the wound has greenish yellowish discharged sometimes mixed with blood. 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 He has not tried any medication for his infected ear lobe.  He was hospitalized for the flu 3 weeks ago.     ROS Denies recent fever or chills. Denies sinus pressure, nasal congestion, ear pain or sore throat. Has  chest congestion, productive cough wheezing on 4lts oxygen at night time only.  Denies chest pains, palpitations and leg swelling denies, no nausea, vomiting,diarrhea or constipation.   Has left ear wound and swelling   PE  BP 118/76 (BP Location: Right Arm, Patient Position: Sitting, Cuff Size: Normal)    Pulse 80    Temp 98.4 F (36.9 C) (Oral)    Ht 5\' 5"  (1.651 m)    Wt 202 lb 0.6 oz (91.6 kg)    SpO2 93%    BMI 33.62 kg/m   Patient alert and oriented and in no cardiopulmonary distress.  HEENT: No facial asymmetry,Neck supple . Left ear around the ear lobe-antitragus area is swollen and red, voiced tenderness on palpation, no drainage noted both external ears are deformed from prior self inflicted injury, unable to examine inner ear due to this.   Chest: Clear to auscultation bilaterally.  CVS: S1, S2 no murmurs, no S3.Regular rate.  ABD: Soft non tender.   Ext: No edema  MS: Adequate ROM spine, shoulders, hips and knees.  Psych: Good eye contact, normal affect. Memory intact not anxious or depressed appearing.     Assessment & Plan

## 2021-09-30 NOTE — Patient Instructions (Addendum)
Take bactrim ds two times daily for 7 days for your era infection. Keep your ear clean and dry , wash with gentle soap and water.     It is important that you exercise regularly at least 30 minutes 5 times a week.  Think about what you will eat, plan ahead. Choose " clean, green, fresh or frozen" over canned, processed or packaged foods which are more sugary, salty and fatty. 70 to 75% of food eaten should be vegetables and fruit. Three meals at set times with snacks allowed between meals, but they must be fruit or vegetables. Aim to eat over a 12 hour period , example 7 am to 7 pm, and STOP after  your last meal of the day. Drink water,generally about 64 ounces per day, no other drink is as healthy. Fruit juice is best enjoyed in a healthy way, by EATING the fruit.  Thanks for choosing [facility], we consider it a privelige to serve you.

## 2021-09-30 NOTE — Assessment & Plan Note (Signed)
DASH diet and commitment to daily physical activity for a minimum of 30 minutes discussed and encouraged, as a part of hypertension management. The importance of attaining a healthy weight is also discussed.  BP/Weight 09/30/2021 09/18/2021 09/13/2021 09/06/2021 09/05/2021 07/24/2021 74/71/8550  Systolic BP 158 682 574 935 - 521 747  Diastolic BP 76 73 62 68 - 72 79  Wt. (Lbs) 202.04 203 202 - 212.74 209.04 208.8  BMI 33.62 33.78 33.61 - 35.4 34.79 34.75  continue current meds.

## 2021-10-02 ENCOUNTER — Encounter: Payer: Self-pay | Admitting: Gastroenterology

## 2021-10-07 DIAGNOSIS — J302 Other seasonal allergic rhinitis: Secondary | ICD-10-CM | POA: Diagnosis not present

## 2021-10-07 DIAGNOSIS — J984 Other disorders of lung: Secondary | ICD-10-CM | POA: Diagnosis not present

## 2021-10-07 DIAGNOSIS — J441 Chronic obstructive pulmonary disease with (acute) exacerbation: Secondary | ICD-10-CM | POA: Diagnosis not present

## 2021-10-08 ENCOUNTER — Encounter (HOSPITAL_COMMUNITY): Payer: Self-pay

## 2021-10-08 NOTE — Progress Notes (Signed)
Attempted to reach patient regarding follow-up LDCT. Unable to reach patient at this time, detailed VM left asking that the patient return my call.

## 2021-10-09 ENCOUNTER — Other Ambulatory Visit: Payer: Self-pay | Admitting: Internal Medicine

## 2021-10-09 DIAGNOSIS — E1159 Type 2 diabetes mellitus with other circulatory complications: Secondary | ICD-10-CM

## 2021-10-09 DIAGNOSIS — I5042 Chronic combined systolic (congestive) and diastolic (congestive) heart failure: Secondary | ICD-10-CM

## 2021-10-15 NOTE — Patient Instructions (Signed)
Randy Hebert  10/15/2021     @PREFPERIOPPHARMACY @   Your procedure is scheduled on  10/18/2021.   Report to [facility] at  [phone] A.M.   Call this number if you have problems the morning of surgery:  [phone]   Remember:  Follow the diet and prep instructions given to you by the office.    DO NOT take any medications for diabetes the morning of your procedure.    Use your nebulizer and your inhalers before you come and bring your rescue inhaler with you.    Take these medicines the morning of surgery with A SIP OF WATER                   amlodipine, celexa, protonix.     Do not wear jewelry, make-up or nail polish.  Do not wear lotions, powders, or perfumes, or deodorant.  Do not shave 48 hours prior to surgery.  Men may shave face and neck.  Do not bring valuables to the hospital.  [facility] is not responsible for any belongings or valuables.  Contacts, dentures or bridgework may not be worn into surgery.  Leave your suitcase in the car.  After surgery it may be brought to your room.  For patients admitted to the hospital, discharge time will be determined by your treatment team.  Patients discharged the day of surgery will not be allowed to drive home and must have someone with them for 24 hours.    Special instructions:   DO NOT smoke tobacco or vape for 24 hours before your procedure.  Please read over the following fact sheets that you were given. Anesthesia Post-op Instructions and Care and Recovery After Surgery      Colonoscopy, Adult, Care After This sheet gives you information about how to care for yourself after your procedure. Your health care provider may also give you more specific instructions. If you have problems or questions, contact your health care provider. What can I expect after the procedure? After the procedure, it is common to have: A small amount of blood in your stool for 24 hours after the procedure. Some gas. Mild  cramping or bloating of your abdomen. Follow these instructions at home: Eating and drinking  Drink enough fluid to keep your urine pale yellow. Follow instructions from your health care provider about eating or drinking restrictions. Resume your normal diet as instructed by your health care provider. Avoid heavy or fried foods that are hard to digest. Activity Rest as told by your health care provider. Avoid sitting for a long time without moving. Get up to take short walks every 1-2 hours. This is important to improve blood flow and breathing. Ask for help if you feel weak or unsteady. Return to your normal activities as told by your health care provider. Ask your health care provider what activities are safe for you. Managing cramping and bloating  Try walking around when you have cramps or feel bloated. Apply heat to your abdomen as told by your health care provider. Use the heat source that your health care provider recommends, such as a moist heat pack or a heating pad. Place a towel between your skin and the heat source. Leave the heat on for 20-30 minutes. Remove the heat if your skin turns bright red. This is especially important if you are unable to feel pain, heat, or cold. You may have a greater risk of getting burned. General instructions  If you were given a sedative during the procedure, it can affect you for several hours. Do not drive or operate machinery until your health care provider says that it is safe. For the first 24 hours after the procedure: Do not sign important documents. Do not drink alcohol. Do your regular daily activities at a slower pace than normal. Eat soft foods that are easy to digest. Take over-the-counter and prescription medicines only as told by your health care provider. Keep all follow-up visits as told by your health care provider. This is important. Contact a health care provider if: You have blood in your stool 2-3 days after the  procedure. Get help right away if you have: More than a small spotting of blood in your stool. Large blood clots in your stool. Swelling of your abdomen. Nausea or vomiting. A fever. Increasing pain in your abdomen that is not relieved with medicine. Summary After the procedure, it is common to have a small amount of blood in your stool. You may also have mild cramping and bloating of your abdomen. If you were given a sedative during the procedure, it can affect you for several hours. Do not drive or operate machinery until your health care provider says that it is safe. Get help right away if you have a lot of blood in your stool, nausea or vomiting, a fever, or increased pain in your abdomen. This information is not intended to replace advice given to you by your health care provider. Make sure you discuss any questions you have with your health care provider. Document Revised: 06/24/2019 Document Reviewed: 03/14/2019 Elsevier Patient Education  [place] After This sheet gives you information about how to care for yourself after your procedure. Your health care provider may also give you more specific instructions. If you have problems or questions, contact your health care provider. What can I expect after the procedure? After the procedure, it is common to have: Tiredness. Forgetfulness about what happened after the procedure. Impaired judgment for important decisions. Nausea or vomiting. Some difficulty with balance. Follow these instructions at home: For the time period you were told by your health care provider:   Rest as needed. Do not participate in activities where you could fall or become injured. Do not drive or use machinery. Do not drink alcohol. Do not take sleeping pills or medicines that cause drowsiness. Do not make important decisions or sign legal documents. Do not take care of children on your own. Eating and  drinking Follow the diet that is recommended by your health care provider. Drink enough fluid to keep your urine pale yellow. If you vomit: Drink water, juice, or soup when you can drink without vomiting. Make sure you have little or no nausea before eating solid foods. General instructions Have a responsible adult stay with you for the time you are told. It is important to have someone help care for you until you are awake and alert. Take over-the-counter and prescription medicines only as told by your health care provider. If you have sleep apnea, surgery and certain medicines can increase your risk for breathing problems. Follow instructions from your health care provider about wearing your sleep device: Anytime you are sleeping, including during daytime naps. While taking prescription pain medicines, sleeping medicines, or medicines that make you drowsy. Avoid smoking. Keep all follow-up visits as told by your health care provider. This is important. Contact a health care provider if: You keep feeling nauseous  or you keep vomiting. You feel light-headed. You are still sleepy or having trouble with balance after 24 hours. You develop a rash. You have a fever. You have redness or swelling around the IV site. Get help right away if: You have trouble breathing. You have new-onset confusion at home. Summary For several hours after your procedure, you may feel tired. You may also be forgetful and have poor judgment. Have a responsible adult stay with you for the time you are told. It is important to have someone help care for you until you are awake and alert. Rest as told. Do not drive or operate machinery. Do not drink alcohol or take sleeping pills. Get help right away if you have trouble breathing, or if you suddenly become confused. This information is not intended to replace advice given to you by your health care provider. Make sure you discuss any questions you have with your  health care provider. Document Revised: 05/03/2020 Document Reviewed: 07/21/2019 Elsevier Patient Education  2022 [employer].

## 2021-10-16 ENCOUNTER — Encounter (HOSPITAL_COMMUNITY)
Admission: RE | Admit: 2021-10-16 | Discharge: 2021-10-16 | Disposition: A | Discharge status: Home / Self Care | Payer: Medicare Other | Source: Ambulatory Visit | Attending: Internal Medicine | Admitting: Internal Medicine

## 2021-10-16 ENCOUNTER — Ambulatory Visit (INDEPENDENT_AMBULATORY_CARE_PROVIDER_SITE_OTHER): Payer: Medicare Other | Admitting: Pharmacist

## 2021-10-16 DIAGNOSIS — I5042 Chronic combined systolic (congestive) and diastolic (congestive) heart failure: Secondary | ICD-10-CM

## 2021-10-16 DIAGNOSIS — I10 Essential (primary) hypertension: Secondary | ICD-10-CM

## 2021-10-16 DIAGNOSIS — F17200 Nicotine dependence, unspecified, uncomplicated: Secondary | ICD-10-CM

## 2021-10-16 DIAGNOSIS — Z6834 Body mass index (BMI) 34.0-34.9, adult: Secondary | ICD-10-CM

## 2021-10-16 DIAGNOSIS — I5022 Chronic systolic (congestive) heart failure: Secondary | ICD-10-CM

## 2021-10-16 DIAGNOSIS — E6609 Other obesity due to excess calories: Secondary | ICD-10-CM

## 2021-10-16 DIAGNOSIS — I25118 Atherosclerotic heart disease of native coronary artery with other forms of angina pectoris: Secondary | ICD-10-CM

## 2021-10-16 DIAGNOSIS — J449 Chronic obstructive pulmonary disease, unspecified: Secondary | ICD-10-CM

## 2021-10-16 DIAGNOSIS — E1159 Type 2 diabetes mellitus with other circulatory complications: Secondary | ICD-10-CM

## 2021-10-16 NOTE — Patient Instructions (Signed)
[name],  It was great to talk to you today!  Please call me with any questions or concerns.  Visit Information  Following are the goals we discussed today:   Goals Addressed             This Visit's Progress    Medication Management       Patient Goals/Self-Care Activities Over the next 90 days, patient will:  Take medications as prescribed Focus on medication adherence by keeping up with prescription refills and either using a pill box or reminders to take your medications at the prescribed times Check blood sugar three times a day at the following times: fasting (at least 8 hours since last food consumption), 1-2 hours after breakfast, bedtime, and whenever patient experiences symptoms of hypo/hyperglycemia, document, and provide at future appointments Check blood pressure at least once daily, document, and provide at future appointments Weigh daily, and contact provider if weight gain of more than 3 lbs in 1 day or more than 5 lbs in 1 week Target a minimum of 150 minutes of moderate intensity exercise weekly Engage in dietary modifications by fewer sweetened foods & beverages, better food choices, and watch portion sizes/amount of food eaten at one time         Follow-up plan: Telephone follow up appointment with care management team member scheduled for:  12/17/21  Please call the care guide team at [phone] if you need to cancel or reschedule your appointment.   Kennon Holter, PharmD, [place], CPP Clinical Pharmacist Practitioner [facility] [phone]

## 2021-10-16 NOTE — Chronic Care Management (AMB) (Signed)
Chronic Care Management Pharmacy Note  10/16/2021 Name:  Randy Hebert MRN:  [id number] DOB:  1965-09-27  Summary: Type 2 Diabetes (Managed by Endocrinology - Dr. Dorris Fetch): Controlled; Most recent A1c was 6.8% which is at goal of <7% per ADA guidelines Current medications: metformin 500 mg by mouth twice daily meals, glipizide XL 5 mg by mouth with breakfast, Farxiga 10 mg by mouth daily, Trulicity 1.5 mg subcutaneously once weekly Continue current management per endocrinology  CAD/Hyperlipidemia (Sees Cardiology - Dr. [name]): Uncontrolled; LDL slightly above goal <70 per 2018 ACC/AHA guidelines Current medications: atorvastatin 40 mg by mouth once daily (reduced from $RemoveBefor'80mg'OfOhdyJYLBxy$  due to elevated liver function tests) Antiplatelet: aspirin 81 mg by mouth daily Per discussion with Dr. [name], will consider addition of ezetimibe 10 mg by mouth daily if LDL increases farther from goal at next lipid panel  Overweight/Obesity Current medications that may contribute to weight loss: Trulicity 1.5 mg subcutaneously once weekly and Farxiga 10 mg by mouth daily Baseline weight: ~240 lbs; most recent weight: 202 lbs Recommend diet modification to induce energy deficit of 500 kcal/day or greater Encouraged regular aerobic exercise with a goal of 30 minutes five times per week (150 minutes per week)  Heart failure (Sees Cardiology - Dr. [name]): Current treatment: furosemide 40 mg daily as needed for fluid overload and Farxiga 10 mg by mouth daily ACEi/ARB/ARNi contraindicated due to angioedema; beta blocker contraindicated due to history of sinus pause and bradycardia Consider addition spironolactone given contraindication to other guideline directed medication therapy.   Chronic Obstructive Pulmonary Disease/Asthma/Smoking Cessation (Sees Pulmonology - Dr. [name]): Patient is still actively smoking but has cut back some Patient was unable to tolerate Chantix due to nightmares and other sleep  disturbances. COPD treatment: tiotropium (Spiriva Respimat) 1 inhalation once daily and budesonide/formoterol (Symbicort HFA) 160-4.5 mcg 2 puffs by mouth twice daily 1 exacerbations requiring treatment in the last 6 months  Continue tiotropium (Spiriva Respimat) 1 inhalation once daily and budesonide/formoterol (Symbicort HFA) 160-4.5 mcg 2 puffs by mouth twice daily Patient counseled on smoking cessation and offered continued support. Patient does not wish to start any other form of pharmacologic therapy at this time.   Subjective: Randy Hebert is an 56 y.o. year old male who is a primary patient of Randy Spar, MD.  The CCM team was consulted for assistance with disease management and care coordination needs.    Engaged with patient by telephone for follow up visit in response to provider referral for pharmacy case management and/or care coordination services.   Consent to Services:  The patient was given information about Chronic Care Management services, agreed to services, and gave verbal consent prior to initiation of services.  Please see initial visit note for detailed documentation.   Patient Care Team: Randy Spar, MD as PCP - General (Internal Medicine) Evans Lance, MD as PCP - Electrophysiology (Cardiology) [name] Alphonse Guild, MD as PCP - Cardiology (Cardiology) Beryle Lathe, [facility] (Pharmacist)  Objective:  Lab Results  Component Value Date   CREATININE 0.74 (L) 09/13/2021   CREATININE 0.64 09/06/2021   CREATININE 0.72 09/05/2021    Lab Results  Component Value Date   HGBA1C 6.8 (H) 09/03/2021   Last diabetic Eye exam:  Lab Results  Component Value Date/Time   HMDIABEYEEXA No Retinopathy 06/25/2021 12:00 AM    Last diabetic Foot exam: No results found for: HMDIABFOOTEX      Component Value Date/Time   CHOL 124 04/11/2021 0819  TRIG 118 04/11/2021 0819   HDL 28 (L) 04/11/2021 0819   CHOLHDL 7.1 (H) 06/21/2020 0707   VLDL 41 (H)  04/27/2017 0505   LDLCALC 74 04/11/2021 0819   LDLCALC 116 (H) 06/21/2020 0707    Hepatic Function Latest Ref Rng & Units 09/13/2021 09/06/2021 09/04/2021  Total Protein 6.0 - 8.5 g/dL 7.3 5.7(L) 5.2(L)  Albumin 3.8 - 4.9 g/dL 4.0 3.0(L) 2.8(L)  AST 0 - 40 IU/L 29 41 34  ALT 0 - 44 IU/L $Remov'27 29 29  'GrHNbp$ Alk Phosphatase 44 - 121 IU/L 179(H) 77 73  Total Bilirubin 0.0 - 1.2 mg/dL 0.5 0.4 0.4  Bilirubin, Direct 0.0 - 0.2 mg/dL - - -    Lab Results  Component Value Date/Time   TSH 3.61 06/21/2020 07:07 AM   TSH 1.58 11/03/2018 07:05 AM   FREET4 1.5 11/03/2018 07:05 AM   FREET4 1.4 11/17/2017 07:09 AM    CBC Latest Ref Rng & Units 09/13/2021 09/06/2021 09/06/2021  WBC 3.4 - 10.8 x10E3/uL 9.5 - -  Hemoglobin 13.0 - 17.7 g/dL 10.2(L) 8.8(L) 8.7(L)  Hematocrit 37.5 - 51.0 % 30.3(L) 27.1(L) 27.3(L)  Platelets 150 - 450 x10E3/uL 444 - -    Lab Results  Component Value Date/Time   VD25OH 36 06/21/2020 07:07 AM   VD25OH 31 11/09/2019 07:14 AM    Clinical ASCVD: Yes  The ASCVD Risk score (Arnett DK, et al., 2019) failed to calculate for the following reasons:   The valid total cholesterol range is 130 to 320 mg/dL    Social History   Tobacco Use  Smoking Status Every Day   Packs/day: 0.50   Years: 46.00   Pack years: 23.00   Types: Cigarettes  Smokeless Tobacco Former   Types: Chew   BP Readings from Last 3 Encounters:  09/30/21 118/76  09/18/21 129/73  09/13/21 118/62   Pulse Readings from Last 3 Encounters:  09/30/21 80  09/18/21 98  09/13/21 81   Wt Readings from Last 3 Encounters:  10/16/21 202 lb (91.6 kg)  09/30/21 202 lb 0.6 oz (91.6 kg)  09/18/21 203 lb (92.1 kg)    Assessment: Review of patient past medical history, allergies, medications, health status, including review of consultants reports, laboratory and other test data, was performed as part of comprehensive evaluation and provision of chronic care management services.   SDOH:  (Social Determinants of Health)  assessments and interventions performed:    CCM Care Plan  Allergies  Allergen Reactions   Carvedilol Other (See Comments)    Sinus pause on telemetry >3 seconds. Longest one 9 sec. No AV nodal agent   Lisinopril Anaphylaxis, Shortness Of Breath and Swelling    Angioedema, required intubation and mechanical ventilation   Chantix [Varenicline] Other (See Comments)    Nightmares and unable to sleep well   Amoxicillin Nausea Only    Did it involve swelling of the face/tongue/throat, SOB, or low BP? No Did it involve sudden or severe rash/hives, skin peeling, or any reaction on the inside of your mouth or nose? No Did you need to seek medical attention at a hospital or doctor's office? No When did it last happen? childhood reaction      If all above answers are "NO", may proceed with cephalosporin use.     Medications Reviewed Today     Reviewed by Bridgette Habermann, CPhT (Pharmacy Technician) on 10/14/21 at 2  Med List Status: Complete   Medication Order Taking? Sig Documenting Provider Last Dose Status Informant  acetaminophen (TYLENOL) 500 MG tablet [id number] Yes Take 2 tablets (1,000 mg total) by mouth every 8 (eight) hours as needed for mild pain or fever. Jennye Boroughs, MD  Active Self  albuterol (PROVENTIL) (2.5 MG/3ML) 0.083% nebulizer solution [id number] Yes Take 3 mLs (2.5 mg total) by nebulization every 6 (six) hours as needed for wheezing or shortness of breath. Noreene Larsson, NP  Active Self  albuterol (VENTOLIN HFA) 108 (90 Base) MCG/ACT inhaler [id number] Yes INHALE 2 PUFFS INTO THE LUNGS EVERY 6 HOURS AS NEEDEDFOR SHORTNESS OF BREATH OR WHEEZING. Dara Hoyer, FNP  Active Self  amLODipine (NORVASC) 10 MG tablet [id number] Yes Take 1 tablet (10 mg total) by mouth daily. Renee Rival, FNP  Active Self  ascorbic acid (VITAMIN C) 500 MG tablet [id number] Yes Take 1 tablet (500 mg total) by mouth daily. Barton Dubois, MD  Active Self  aspirin 81 MG EC tablet [id number] Yes  Take 1 tablet (81 mg total) by mouth daily.  Patient taking differently: Take 81 mg by mouth in the morning and at bedtime.   Cheryln Manly, NP  Active Self  atorvastatin (LIPITOR) 40 MG tablet [id number] Yes TAKE 1 TABLET BY MOUTH ONCE A DAY.  Patient taking differently: Take 40 mg by mouth at bedtime.   Randy Spar, MD  Active Self  Azelastine HCl 137 MCG/SPRAY SOLN [id number] Yes USE 2 SPRAYS IN EACH NOSTRIL TWICE DAILY. Valentina Shaggy, MD  Active Self  Cholecalciferol (VITAMIN D3) 125 MCG (5000 UT) CAPS [id number] Yes Take 1 capsule (5,000 Units total) by mouth daily. Cassandria Anger, MD  Active Self  citalopram (CELEXA) 20 MG tablet [id number] Yes Take 20 mg by mouth daily. [provider]  Active Self  cycloSPORINE (RESTASIS) 0.05 % ophthalmic emulsion [id number] Yes Place 1 drop into both eyes 2 (two) times daily. [provider]  Active Self  Dextromethorphan-guaiFENesin ([place] FAST-MAX DM MAX) 5-100 MG/5ML LIQD [id number] Yes Take 5 mLs by mouth daily as needed (cough/congestion). [provider]  Active Self  FARXIGA 10 MG TABS tablet [id number] Yes TAKE ONE TABLET BY MOUTH ONCE DAILY. Randy Spar, MD  Active Self  ferrous sulfate 325 (65 FE) MG tablet [id number] Yes Take 1 tablet (325 mg total) by mouth daily with breakfast. Noreene Larsson, NP  Active Self  Fluticasone-Umeclidin-Vilant (TRELEGY ELLIPTA) 100-62.5-25 MCG/ACT AEPB [id number] No Inhale 1 puff into the lungs daily.  Patient not taking: Reported on 10/14/2021   Randy Spar, MD Not Taking Consider Medication Status and Discontinue Self  furosemide (LASIX) 40 MG tablet [id number] Yes Take 1 tablet (40 mg total) by mouth daily as needed for fluid. Evans Lance, MD  Active Self    Discontinued 10/14/21 1424 (Change in therapy)   glipiZIDE (GLUCOTROL XL) 5 MG 24 hr tablet [id number] Yes TAKE 1 TABLET BY MOUTH DAILY WITH BREAKFAST. Cassandria Anger, MD  Active Self  glucose  blood (ACCU-CHEK AVIVA PLUS) test strip [id number]  Use as instructed to check blood glucose three times daily. Cassandria Anger, MD  Active Self  lactulose [facility]) 10 GM/15ML solution [id number] Yes Take 20 g by mouth 2 (two) times daily. [provider]  Active Self  magnesium gluconate (MAGONATE) 500 MG tablet [id number] Yes Take 500 mg by mouth 2 (two) times daily. [provider]  Active Self  Melatonin 10 MG CAPS [id number] Yes Take 10 mg by mouth at bedtime. [provider]  Active Self  metFORMIN (GLUCOPHAGE)  500 MG tablet [id number] Yes Take 1 tablet (500 mg total) by mouth 2 (two) times daily with a meal. Nida, Marella Chimes, MD  Active Self  mometasone (ELOCON) 0.1 % cream [id number] Yes Apply 1 application topically daily as needed (eczema on ears). [provider]  Active Self  nitroGLYCERIN (NITROSTAT) 0.4 MG SL tablet [id number] Yes Place 1 tablet (0.4 mg total) under the tongue every 5 (five) minutes as needed for chest pain. Evans Lance, MD  Active Self  olopatadine (PATANOL) 0.1 % ophthalmic solution [id number] Yes Place 1 drop into both eyes 2 (two) times daily. [provider]  Active Self  pantoprazole (PROTONIX) 40 MG tablet [id number] Yes TAKE (1) TABLET BY MOUTH TWICE DAILY BEFORE A MEAL. Erenest Rasher, PA-C  Active Self  polyethylene glycol-electrolytes (TRILYTE) 420 g solution [id number]  Take 4,000 mLs by mouth as directed. Eloise Harman, DO  Active Self    Discontinued 10/14/21 1419 (Completed Course) sulfamethoxazole-trimethoprim (BACTRIM DS) 800-160 MG tablet [id number] No Take 1 tablet by mouth 2 (two) times daily.  Patient not taking: Reported on 10/14/2021   Renee Rival, FNP Completed Course Consider Medication Status and Discontinue Self  traZODone (DESYREL) 50 MG tablet [id number] Yes Take 0.5-1 tablets (25-50 mg total) by mouth at bedtime as needed for sleep. Randy Spar, MD  Active Self   triamcinolone ointment (KENALOG) 0.1 % [id number] Yes Apply 1 application topically 2 (two) times daily.  Patient taking differently: Apply 1 application topically 2 (two) times daily as needed (hives).   Valentina Shaggy, MD  Active Self  TRULICITY 1.5 YN/1.8ZF [place] Aid [id number] Yes INJECT 1 1/2 MG INTO THE SKIN ONCE WEEKLY.  Patient taking differently: Inject 1.5 mg as directed every Monday.   Cassandria Anger, MD  Active Self            Patient Active Problem List   Diagnosis Date Noted   Infection of ear lobe, left 09/30/2021   History of colonic polyps 09/18/2021   GI bleed 09/18/2021   Hospital discharge follow-up 09/13/2021   Acute respiratory failure with hypoxia ([place]) 09/13/2021   Insomnia 09/13/2021   Upper GI bleed 09/03/2021   Hypoalbuminemia due to protein-calorie malnutrition ([place]) 09/03/2021   Elevated troponin 09/03/2021   Obesity (BMI 30.0-34.9) 09/03/2021   Hyperkalemia 09/03/2021   Hyperammonemia ([place]) 09/03/2021   Ulnar tunnel syndrome, left 07/24/2021   Dry scalp 07/24/2021   COPD exacerbation ([place]) 05/13/2021   Iron deficiency anemia due to chronic blood loss 04/02/2021   Allergic reaction 01/09/2021   Annual visit for general adult medical examination with abnormal findings 06/20/2020   Encounter for screening for malignant neoplasm of prostate 06/20/2020   Carpal tunnel syndrome 12/27/2019   Seasonal and perennial allergic rhinitis 11/10/2018   Restrictive lung disease 11/10/2018   Cirrhosis of liver ([place]) 11/08/2018   Diarrhea 08/04/2018   Barrett's esophagus 08/04/2018   Vitamin D deficiency 06/30/2018   Alcohol abuse 06/30/2018   DM type 2 causing vascular disease ([place]) 08/17/2017   Chronic combined systolic and diastolic CHF (congestive heart failure) ([place]) 05/15/2017   OSA (obstructive sleep apnea) 04/28/2017   Depression with anxiety 04/15/2017   GERD (gastroesophageal reflux disease) 09/23/2016   COPD (chronic obstructive pulmonary  disease) ([place]) 08/27/2015   Cigarette nicotine dependence with nicotine-induced disorder 08/27/2015   Mixed hyperlipidemia 08/27/2015   Class 1 obesity due to excess calories without serious comorbidity with body mass index (BMI) of 34.0 to 34.9 in adult 08/27/2015  Chronic systolic CHF (congestive heart failure) ([place]) 06/01/2015   CAD (coronary artery disease) 05/31/2015   Current smoker 05/31/2015   Essential hypertension 02/09/2015    Immunization History  Administered Date(s) Administered   Influenza,inj,Quad PF,6+ Mos 07/03/2016, 05/07/2017, 06/20/2020, 07/24/2021   Influenza-Unspecified 05/17/2018   Moderna SARS-COV2 Booster Vaccination 03/01/2020   Moderna Sars-Covid-2 Vaccination 02/20/2020   Pneumococcal Conjugate-13 06/17/2017   Pneumococcal Polysaccharide-23 02/07/2015   Tdap 05/17/2019    Conditions to be addressed/monitored: CAD, HTN, HLD, COPD, DMII, Asthma, Tobacco Use, and obesity  Care Plan : Medication Management  Updates made by Beryle Lathe, [place] since 10/16/2021 12:00 AM     Problem: Diabetes, Hypertension, Coronar Artery Disease, Hyperlipidemia, COPD/Asthma, Smoking Cessation, Weight Management   Priority: High  Onset Date: 04/18/2021     Long-Range Goal: Disease Progression Prevention   Start Date: 04/18/2021  Expected End Date: 07/17/2021  Recent Progress: On track  Priority: High  Note:   Current Barriers:  Unable to quit smoking  Pharmacist Clinical Goal(s):  Through collaboration with PharmD and provider, patient will reduce/stop smoking  Interventions: 1:1 collaboration with Ihor Dow, MD regarding development and update of comprehensive plan of care as evidenced by provider attestation and co-signature Inter-disciplinary care team collaboration (see longitudinal plan of care) Comprehensive medication review performed; medication list updated in electronic medical record  Type 2 Diabetes (Managed by Endocrinology - Dr.  Dorris Fetch): Controlled; Most recent A1c was 6.8% which is at goal of <7% per ADA guidelines Current medications: metformin 500 mg by mouth twice daily meals, glipizide XL 5 mg by mouth with breakfast, Farxiga 10 mg by mouth daily, Trulicity 1.5 mg subcutaneously once weekly Intolerances: none Taking medications as directed: yes Side effects thought to be attributed to current medication regimen: no Denies recent hypoglycemic/hyperglycemic symptoms Current meal patterns: patient reports that he has improved his diet Current exercise: not discussed today  On a statin: $RemoveB'[x]'HXHojHaI$  Yes  $Re'[]'Yzr$  No    Last microalbumin: 1.8 (11/09/19); on an ACEi/ARB: $RemoveBefo'[]'WXkNKqqulcX$  Yes  $Re'[x]'RUH$  No, contraindicated due to angioedema Last eye exam: completed within last year (last October 2022) Last foot exam: overdue Pneumonia vaccine: up to date Current glucose readings: not discussed today Continue current management per endocrinology  Hypertension: Blood pressure under good control. Blood pressure is at goal of <130/80 mmHg per 2017 AHA/ACC guidelines. Current medications: amlodipine 10 mg by mouth once daily, furosemide 40 mg by mouth daily as needed Intolerances: angioedema with lisinopril (avoid use of ACEi/ARB/ARNI); sinus pause and bradycardia with beta blocker Taking medications as directed: yes Side effects thought to be attributed to current medication regimen: no Denies dizziness, lightheadedness, blurred vision, and headache Home blood pressure readings: not discussed today Continue amlodipine 10 mg by mouth once daily and furosemide 40 mg by mouth daily as needed Encourage dietary sodium restriction/DASH diet Recommend regular aerobic exercise Recommend home blood pressure monitoring, to bring results in next visit Discussed need for and importance of continued work on weight loss Discussed need for medication compliance  CAD/Hyperlipidemia (Sees Cardiology - Dr. [name]): Uncontrolled; LDL slightly above goal <70 per 2018  ACC/AHA guidelines Current medications: atorvastatin 40 mg by mouth once daily (reduced from $RemoveBefor'80mg'wXhRYwllkjlo$  due to elevated liver function tests) Antiplatelet: aspirin 81 mg by mouth daily Intolerances: none Taking medications as directed: yes Side effects thought to be attributed to current medication regimen: no Continue atorvastatin 40 mg by mouth once daily and aspirin 81 mg daily Per discussion with Dr. [name], will consider addition of ezetimibe  10 mg by mouth daily if LDL increases farther from goal at next lipid panel Recommend regular aerobic exercise Discussed need for and importance of continued work on weight loss Reviewed risks of hyperlipidemia, principles of treatment and consequences of untreated hyperlipidemia Re-check lipid panel at next primary care or cardiology provider visit  Overweight/Obesity Unable to achieve goal weight loss through lifestyle modification alone Current medications that may contribute to weight loss: Trulicity 1.5 mg subcutaneously once weekly and Farxiga 10 mg by mouth daily Medications/Strategies previously tried: none; Fasting/Modified Fasting Baseline weight: ~240 lbs; most recent weight: 202 lbs Extensive dietary counseling including education on focus on lean proteins, fruits and vegetables, whole grains and increased fiber consumption, adequate hydration Continue GLP-1 agonist and SGLT-2 inhibitor Recommend diet modification to induce energy deficit of 500 kcal/day or greater Encouraged regular aerobic exercise with a goal of 30 minutes five times per week (150 minutes per week)  Heart failure with reduced ejection fraction (LVEF <40%) (Sees Cardiology - Dr. [name]): Appropriately managed Current treatment: furosemide 40 mg daily as needed for fluid overload and Farxiga 10 mg by mouth daily ACEi/ARB/ARNi contraindicated due to angioedema; beta blocker contraindicated due to history of sinus pause and bradycardia Reports inability to take potassium  tablets due to size of pill. Reports has not taken x1 year. Potassium level normal. Consider potassium capsule instead of tablet if needed in future. Stage C (Symptomatic heart failure)/NYHA Class II (Slight limitation of physical activity. Comfortable at rest. Ordinary physical activity results in fatigue, palpitation, or dyspnea) Most recent echocardiogram was completed 05/17/21 with LVEF 45-50% which was slightly improved from 2016 (LVEF ~40%) Last BNP: 32.6 (2018) Current home blood pressure: not discussed today Current home weights: not discussed today Consider addition spironolactone given contraindication to other guideline directed medication therapy.  Encourage dietary sodium restriction (<3 g/day) Educated on the importance of weighing daily. Patient aware to contact cardiology/primary care team if weight gain >3 lbs in 1 day or >5 lbs in 1 week Discussed need for and importance of continued work on weight loss  Chronic Obstructive Pulmonary Disease/Asthma/Smoking Cessation (Sees Pulmonology - Dr. [name]): Patient is still actively smoking but has cut back some Patient was unable to tolerate Chantix due to nightmares and other sleep disturbances. COPD treatment: tiotropium (Spiriva Respimat) 1 inhalation once daily and budesonide/formoterol (Symbicort HFA) 160-4.5 mcg 2 puffs by mouth twice daily Reports he has also uses albuterol inhaler and nebulizer as needed Most recent Pulmonary Function Testing: 01/11/21 follows with Dr. [name] 1 exacerbations requiring treatment in the last 6 months  Current oxygen requirements: none Continue tiotropium (Spiriva Respimat) 1 inhalation once daily and budesonide/formoterol (Symbicort HFA) 160-4.5 mcg 2 puffs by mouth twice daily Discussed need for medication compliance Patient counseled on smoking cessation and offered continued support. Patient does not wish to start any other form of pharmacologic therapy at this time.  Reminded patient to  rinse mouth with water and spit after using ICS containing inhaler to prevent thrush Recommend allergen avoidance (environmental control)  Patient Goals/Self-Care Activities Over the next 90 days, patient will:  Take medications as prescribed Focus on medication adherence by keeping up with prescription refills and either using a pill box or reminders to take your medications at the prescribed times Check blood sugar three times a day at the following times: fasting (at least 8 hours since last food consumption), 1-2 hours after breakfast, bedtime, and whenever patient experiences symptoms of hypo/hyperglycemia, document, and provide at future appointments Check blood pressure  at least once daily, document, and provide at future appointments Weigh daily, and contact provider if weight gain of more than 3 lbs in 1 day or more than 5 lbs in 1 week Target a minimum of 150 minutes of moderate intensity exercise weekly Engage in dietary modifications by fewer sweetened foods & beverages, better food choices, and watch portion sizes/amount of food eaten at one time  Follow Up Plan: Telephone follow up appointment with care management team member scheduled for: 12/17/21     Medication Assistance: None required.  Patient affirms current coverage meets needs.  Patient's preferred pharmacy is:  [facility], [address] [address] Phone: [phone] Fax: [phone]  Follow Up:  Patient agrees to Care Plan and Follow-up.  Plan: Telephone follow up appointment with care management team member scheduled for:  12/17/21  Kennon Holter, PharmD, [place], [place] Clinical Pharmacist Practitioner [facility] [phone]

## 2021-10-17 ENCOUNTER — Telehealth: Payer: Self-pay | Admitting: Internal Medicine

## 2021-10-17 NOTE — Telephone Encounter (Signed)
Pt returning call for blood work results

## 2021-10-17 NOTE — Telephone Encounter (Signed)
Pt advised with verbal understanding  °

## 2021-10-18 ENCOUNTER — Encounter (HOSPITAL_COMMUNITY)
Admission: RE | Disposition: A | Discharge status: Home / Self Care | Payer: Self-pay | Source: Ambulatory Visit | Attending: Internal Medicine

## 2021-10-18 ENCOUNTER — Ambulatory Visit (HOSPITAL_COMMUNITY)
Admission: RE | Admit: 2021-10-18 | Discharge: 2021-10-18 | Disposition: A | Discharge status: Home / Self Care | Payer: Medicare Other | Source: Ambulatory Visit | Attending: Internal Medicine | Admitting: Internal Medicine

## 2021-10-18 ENCOUNTER — Ambulatory Visit (HOSPITAL_COMMUNITY): Payer: Medicare Other | Admitting: Anesthesiology

## 2021-10-18 ENCOUNTER — Encounter (HOSPITAL_COMMUNITY): Payer: Self-pay | Admitting: *Deleted

## 2021-10-18 ENCOUNTER — Ambulatory Visit (HOSPITAL_BASED_OUTPATIENT_CLINIC_OR_DEPARTMENT_OTHER): Payer: Medicare Other | Admitting: Anesthesiology

## 2021-10-18 ENCOUNTER — Other Ambulatory Visit: Payer: Self-pay

## 2021-10-18 DIAGNOSIS — D125 Benign neoplasm of sigmoid colon: Secondary | ICD-10-CM | POA: Diagnosis not present

## 2021-10-18 DIAGNOSIS — I110 Hypertensive heart disease with heart failure: Secondary | ICD-10-CM | POA: Diagnosis not present

## 2021-10-18 DIAGNOSIS — I252 Old myocardial infarction: Secondary | ICD-10-CM | POA: Diagnosis not present

## 2021-10-18 DIAGNOSIS — M1990 Unspecified osteoarthritis, unspecified site: Secondary | ICD-10-CM | POA: Insufficient documentation

## 2021-10-18 DIAGNOSIS — D509 Iron deficiency anemia, unspecified: Secondary | ICD-10-CM

## 2021-10-18 DIAGNOSIS — Z86010 Personal history of colonic polyps: Secondary | ICD-10-CM | POA: Insufficient documentation

## 2021-10-18 DIAGNOSIS — F17210 Nicotine dependence, cigarettes, uncomplicated: Secondary | ICD-10-CM | POA: Insufficient documentation

## 2021-10-18 DIAGNOSIS — F419 Anxiety disorder, unspecified: Secondary | ICD-10-CM | POA: Insufficient documentation

## 2021-10-18 DIAGNOSIS — K648 Other hemorrhoids: Secondary | ICD-10-CM | POA: Diagnosis not present

## 2021-10-18 DIAGNOSIS — J449 Chronic obstructive pulmonary disease, unspecified: Secondary | ICD-10-CM | POA: Diagnosis not present

## 2021-10-18 DIAGNOSIS — Z955 Presence of coronary angioplasty implant and graft: Secondary | ICD-10-CM | POA: Insufficient documentation

## 2021-10-18 DIAGNOSIS — K219 Gastro-esophageal reflux disease without esophagitis: Secondary | ICD-10-CM | POA: Diagnosis not present

## 2021-10-18 DIAGNOSIS — D123 Benign neoplasm of transverse colon: Secondary | ICD-10-CM | POA: Diagnosis not present

## 2021-10-18 DIAGNOSIS — K921 Melena: Secondary | ICD-10-CM | POA: Diagnosis not present

## 2021-10-18 DIAGNOSIS — D122 Benign neoplasm of ascending colon: Secondary | ICD-10-CM | POA: Insufficient documentation

## 2021-10-18 DIAGNOSIS — Z7951 Long term (current) use of inhaled steroids: Secondary | ICD-10-CM | POA: Insufficient documentation

## 2021-10-18 DIAGNOSIS — D124 Benign neoplasm of descending colon: Secondary | ICD-10-CM | POA: Diagnosis not present

## 2021-10-18 DIAGNOSIS — K7030 Alcoholic cirrhosis of liver without ascites: Secondary | ICD-10-CM | POA: Insufficient documentation

## 2021-10-18 DIAGNOSIS — Z79899 Other long term (current) drug therapy: Secondary | ICD-10-CM | POA: Insufficient documentation

## 2021-10-18 DIAGNOSIS — F32A Depression, unspecified: Secondary | ICD-10-CM | POA: Insufficient documentation

## 2021-10-18 DIAGNOSIS — Z7985 Long-term (current) use of injectable non-insulin antidiabetic drugs: Secondary | ICD-10-CM | POA: Insufficient documentation

## 2021-10-18 DIAGNOSIS — K449 Diaphragmatic hernia without obstruction or gangrene: Secondary | ICD-10-CM | POA: Insufficient documentation

## 2021-10-18 DIAGNOSIS — Z9981 Dependence on supplemental oxygen: Secondary | ICD-10-CM | POA: Insufficient documentation

## 2021-10-18 DIAGNOSIS — K5730 Diverticulosis of large intestine without perforation or abscess without bleeding: Secondary | ICD-10-CM | POA: Diagnosis not present

## 2021-10-18 DIAGNOSIS — I509 Heart failure, unspecified: Secondary | ICD-10-CM | POA: Insufficient documentation

## 2021-10-18 DIAGNOSIS — Z8616 Personal history of COVID-19: Secondary | ICD-10-CM | POA: Diagnosis not present

## 2021-10-18 DIAGNOSIS — Z7984 Long term (current) use of oral hypoglycemic drugs: Secondary | ICD-10-CM | POA: Diagnosis not present

## 2021-10-18 DIAGNOSIS — I2510 Atherosclerotic heart disease of native coronary artery without angina pectoris: Secondary | ICD-10-CM | POA: Diagnosis not present

## 2021-10-18 DIAGNOSIS — I255 Ischemic cardiomyopathy: Secondary | ICD-10-CM | POA: Diagnosis not present

## 2021-10-18 DIAGNOSIS — K635 Polyp of colon: Secondary | ICD-10-CM | POA: Diagnosis not present

## 2021-10-18 DIAGNOSIS — E119 Type 2 diabetes mellitus without complications: Secondary | ICD-10-CM | POA: Diagnosis not present

## 2021-10-18 HISTORY — PX: POLYPECTOMY: SHX5525

## 2021-10-18 HISTORY — PX: COLONOSCOPY WITH PROPOFOL: SHX5780

## 2021-10-18 LAB — GLUCOSE, CAPILLARY: Glucose-Capillary: 122 mg/dL — ABNORMAL HIGH (ref 70–99)

## 2021-10-18 SURGERY — COLONOSCOPY WITH PROPOFOL
Anesthesia: General

## 2021-10-18 MED ORDER — LACTATED RINGERS IV SOLN: INTRAVENOUS | Status: DC

## 2021-10-18 MED ORDER — PROPOFOL 10 MG/ML IV BOLUS
INTRAVENOUS | Status: DC | PRN
  Administered 2021-10-18: 30 mg via INTRAVENOUS
  Administered 2021-10-18: 10 mg via INTRAVENOUS
  Administered 2021-10-18: 50 mg via INTRAVENOUS

## 2021-10-18 MED ORDER — LIDOCAINE HCL (CARDIAC) PF 100 MG/5ML IV SOSY
PREFILLED_SYRINGE | INTRAVENOUS | Status: DC | PRN
  Administered 2021-10-18: 50 mg via INTRAVENOUS

## 2021-10-18 MED ORDER — PROPOFOL 500 MG/50ML IV EMUL
INTRAVENOUS | Status: DC | PRN
  Administered 2021-10-18: 150 ug/kg/min via INTRAVENOUS

## 2021-10-18 MED ORDER — IPRATROPIUM-ALBUTEROL 0.5-2.5 (3) MG/3ML IN SOLN
RESPIRATORY_TRACT | Status: AC
  Administered 2021-10-18: 3 mL
  Filled 2021-10-18: qty 3

## 2021-10-18 NOTE — Anesthesia Preprocedure Evaluation (Addendum)
Anesthesia Evaluation  Patient identified by MRN, date of birth, ID band Patient awake    Reviewed: Allergy & Precautions, NPO status , Patient's Chart, lab work & pertinent test results  History of Anesthesia Complications (+) DIFFICULT AIRWAY and history of anesthetic complications  Airway Mallampati: III  TM Distance: >3 FB Neck ROM: Full   Comment: H/o difficult airway Dental  (+) Dental Advisory Given, Missing   Pulmonary shortness of breath (uses oxygen during night), with exertion, lying and Long-Term Oxygen Therapy, asthma , sleep apnea and Continuous Positive Airway Pressure Ventilation , pneumonia (COVID - 10/07/2019), resolved, COPD,  COPD inhaler and oxygen dependent, Current Smoker and Patient abstained from smoking.,  Oxygen sats - 87 to 91   Room air spo2 - 94   + wheezing      Cardiovascular Exercise Tolerance: Poor hypertension, Pt. on medications + CAD, + Past MI, + Cardiac Stents, +CHF and + DOE  (-) Cardiac Defibrillator + Valvular Problems/Murmurs  Rhythm:Regular Rate:Normal  1. Left ventricular ejection fraction, by estimation, is 45 to 50%. The left ventricle has mildly decreased function. The left ventricle demonstrates regional wall motion abnormalities (see scoring diagram/findings for description). There is mild left ventricular hypertrophy. Left ventricular diastolic parameters are consistent with Grade I diastolic dysfunction (impaired relaxation).  2. Right ventricular systolic function is normal. The right ventricular size is normal. Tricuspid regurgitation signal is inadequate for assessing PA pressure.  3. Posterior pericardium appears thickened.  4. The mitral valve is grossly normal. Trivial mitral valve  regurgitation.  5. The aortic valve is tricuspid. Aortic valve regurgitation is not visualized. Mild aortic valve sclerosis is present, with no evidence of aortic valve stenosis.  6. The inferior  vena cava is normal in size with greater than 50% respiratory variability, suggesting right atrial pressure of 3 mmHg.   Echo shows some mild ongoing weakness to heart muscle, I have discussed with a a clinical pharmacist who is working in arranging starting a new medication called farxiga that can help strengthen the heart over time  [name] MD 04/2021    Neuro/Psych  Headaches, PSYCHIATRIC DISORDERS Anxiety Depression  Neuromuscular disease    GI/Hepatic GERD  Medicated,(+)     substance abuse  alcohol use,   Endo/Other  diabetes, Well Controlled, Type 2, Oral Hypoglycemic Agents  Renal/GU      Musculoskeletal  (+) Arthritis ,   Abdominal   Peds  Hematology  (+) Blood dyscrasia, anemia ,   Anesthesia Other Findings Conclusions: 2018 1. Severe single-vessel coronary artery disease with subtotal thrombotic occlusion of proximal ramus intermedius with TIMI-1 flow. 2. Mild to moderate disease involving the LAD and RCA, which are large and ectatic vessels. Previously placed diagonal stent is widely patent. 3. Moderately reduced LVEF with mid and apical anterolateral hypokinesis. 4. Mildly elevated left ventricular filling pressure. 5. Successful PCI to proximal ramus intermedius with placement of a Xience Sierra 2.75 x 15 mm drug-eluting stent (post dilated to 3.1 mm) with 0% residual stenosis and TIMI-3 flow.  Recommendations: 1. Continue tirofiban for 4 hours post procedure. 2. Dual antiplatelet therapy with aspirin and present growth for at least 12 months (patient has history of intolerance to ticagrelor). 3. Aggressive secondary prevention and medical therapy of CAD and ischemic cardiomyopathy with moderately reduced LVEF. Smoking cessation strongly encouraged.   Reproductive/Obstetrics                           Anesthesia Physical  Anesthesia Plan  ASA: 4  Anesthesia Plan: General   Post-op Pain Management: Minimal or no pain  anticipated   Induction: Intravenous  PONV Risk Score and Plan: 1 and TIVA  Airway Management Planned: Nasal Cannula, Natural Airway and Simple Face Mask  Additional Equipment:   Intra-op Plan:   Post-operative Plan:   Informed Consent: I have reviewed the patients History and Physical, chart, labs and discussed the procedure including the risks, benefits and alternatives for the proposed anesthesia with the patient or authorized representative who has indicated his/her understanding and acceptance.     Dental advisory given  Plan Discussed with: CRNA and Surgeon  Anesthesia Plan Comments: (Will give duoneb treatment before the procedure.)      Anesthesia Quick Evaluation

## 2021-10-18 NOTE — Anesthesia Postprocedure Evaluation (Signed)
Anesthesia Post Note  Patient: Randy Hebert  Procedure(s) Performed: COLONOSCOPY WITH PROPOFOL POLYPECTOMY  Patient location during evaluation: Phase II Anesthesia Type: General Level of consciousness: awake and alert and oriented Pain management: pain level controlled Vital Signs Assessment: post-procedure vital signs reviewed and stable Respiratory status: spontaneous breathing, nonlabored ventilation and respiratory function stable Cardiovascular status: blood pressure returned to baseline and stable Postop Assessment: no apparent nausea or vomiting Anesthetic complications: no   No notable events documented.   Last Vitals:  Vitals:   10/18/21 0800 10/18/21 0816  BP: (!) 105/58 120/71  Pulse: 79 69  Resp: (!) 21 (!) 23  Temp: 36.7 C 36.7 C  SpO2: 97% 94%    Last Pain:  Vitals:   10/18/21 0816  TempSrc: Oral  PainSc: 0-No pain                 Rozelia Catapano C Keitha Kolk

## 2021-10-18 NOTE — Op Note (Signed)
[facility] Patient Name: Randy Hebert Procedure Date: 10/18/2021 7:16 AM MRN: [id number] Date of Birth: December 28, 1965 Attending MD: Elon Alas. Edgar Frisk CSN: [id number] Age: 56 Admit Type: Outpatient Procedure:                Colonoscopy Indications:              Hematochezia, Iron deficiency anemia Providers:                Elon Alas. Abbey Chatters, DO, Lurline Del, RN, Hughie Closs                            RN, RN, Randa Spike, Technician Referring MD:              Medicines:                See the Anesthesia note for documentation of the                            administered medications Complications:            No immediate complications. Estimated Blood Loss:     Estimated blood loss was minimal. Procedure:                Pre-Anesthesia Assessment:                           - The anesthesia plan was to use monitored                            anesthesia care (MAC).                           After obtaining informed consent, the colonoscope                            was passed under direct vision. Throughout the                            procedure, the patient's blood pressure, pulse, and                            oxygen saturations were monitored continuously. The                            [id number]-HQ190L ([id number]) scope was introduced through                            the anus and advanced to the the cecum, identified                            by appendiceal orifice and ileocecal valve. The                            colonoscopy was performed without difficulty. The                            patient tolerated the  procedure well. The quality                            of the bowel preparation was evaluated using the                            BBPS [facility] Bowel Preparation Scale) with scores                            of: Right Colon = 3, Transverse Colon = 3 and Left                            Colon = 3 (entire mucosa seen well with no residual                             staining, small fragments of stool or opaque                            liquid). The total BBPS score equals 9. Scope In: 7:37:25 AM Scope Out: 0:17:51 AM Scope Withdrawal Time: 0 hours 15 minutes 19 seconds  Total Procedure Duration: 0 hours 19 minutes 13 seconds  Findings:      The perianal and digital rectal examinations were normal.      Non-bleeding internal hemorrhoids were found during endoscopy.      Many small and large-mouthed diverticula were found in the sigmoid       colon, descending colon and transverse colon.      A 9 mm polyp was found in the ascending colon. The polyp was sessile.       The polyp was removed with a cold snare. Resection and retrieval were       complete.      Four sessile polyps were found in the sigmoid colon, descending colon       and transverse colon. The polyps were 3 to 5 mm in size. These polyps       were removed with a cold snare. Resection and retrieval were complete.      The exam was otherwise without abnormality. Impression:               - Non-bleeding internal hemorrhoids.                           - Diverticulosis in the sigmoid colon, in the                            descending colon and in the transverse colon.                           - One 9 mm polyp in the ascending colon, removed                            with a cold snare. Resected and retrieved.                           - Four 3 to 5 mm polyps in the sigmoid colon,  in                            the descending colon and in the transverse colon,                            removed with a cold snare. Resected and retrieved.                           - The examination was otherwise normal. Moderate Sedation:      Per Anesthesia Care Recommendation:           - Patient has a contact number available for                            emergencies. The signs and symptoms of potential                            delayed complications were discussed with the                             patient. Return to normal activities tomorrow.                            Written discharge instructions were provided to the                            patient.                           - Resume previous diet.                           - Continue present medications.                           - Await pathology results.                           - Repeat colonoscopy in 3 - 5 years for                            surveillance.                           - Return to GI clinic in 4 months. Procedure Code(s):        --- Professional ---                           [phone], Colonoscopy, flexible; with removal of                            tumor(s), polyp(s), or other lesion(s) by snare                            technique Diagnosis Code(s):        --- Professional ---  K64.8, Other hemorrhoids                           K63.5, Polyp of colon                           K92.1, Melena (includes Hematochezia)                           D50.9, Iron deficiency anemia, unspecified                           K57.30, Diverticulosis of large intestine without                            perforation or abscess without bleeding CPT copyright 2019 [employer]. All rights reserved. The codes documented in this report are preliminary and upon coder review may  be revised to meet current compliance requirements. Elon Alas. Abbey Chatters, DO Ray Abbey Chatters, DO 10/18/2021 7:59:15 AM This report has been signed electronically. Number of Addenda: 0

## 2021-10-18 NOTE — Interval H&P Note (Signed)
History and Physical Interval Note:  10/18/2021 7:29 AM  Randy Hebert  has presented today for surgery, with the diagnosis of history of polyps, rectal bleeding.  The various methods of treatment have been discussed with the patient and family. After consideration of risks, benefits and other options for treatment, the patient has consented to  Procedure(s) with comments: COLONOSCOPY WITH PROPOFOL (N/A) - 7:30am as a surgical intervention.  The patient's history has been reviewed, patient examined, no change in status, stable for surgery.  I have reviewed the patient's chart and labs.  Questions were answered to the patient's satisfaction.     Eloise Harman

## 2021-10-18 NOTE — Discharge Instructions (Signed)
°  Colonoscopy Discharge Instructions  Read the instructions outlined below and refer to this sheet in the next few weeks. These discharge instructions provide you with general information on caring for yourself after you leave the hospital. Your doctor may also give you specific instructions. While your treatment has been planned according to the most current medical practices available, unavoidable complications occasionally occur.   ACTIVITY You may resume your regular activity, but move at a slower pace for the next 24 hours.  Take frequent rest periods for the next 24 hours.  Walking will help get rid of the air and reduce the bloated feeling in your belly (abdomen).  No driving for 24 hours (because of the medicine (anesthesia) used during the test).   Do not sign any important legal documents or operate any machinery for 24 hours (because of the anesthesia used during the test).  NUTRITION Drink plenty of fluids.  You may resume your normal diet as instructed by your doctor.  Begin with a light meal and progress to your normal diet. Heavy or fried foods are harder to digest and may make you feel sick to your stomach (nauseated).  Avoid alcoholic beverages for 24 hours or as instructed.  MEDICATIONS You may resume your normal medications unless your doctor tells you otherwise.  WHAT YOU CAN EXPECT TODAY Some feelings of bloating in the abdomen.  Passage of more gas than usual.  Spotting of blood in your stool or on the toilet paper.  IF YOU HAD POLYPS REMOVED DURING THE COLONOSCOPY: No aspirin products for 7 days or as instructed.  No alcohol for 7 days or as instructed.  Eat a soft diet for the next 24 hours.  FINDING OUT THE RESULTS OF YOUR TEST Not all test results are available during your visit. If your test results are not back during the visit, make an appointment with your caregiver to find out the results. Do not assume everything is normal if you have not heard from your  caregiver or the medical facility. It is important for you to follow up on all of your test results.  SEEK IMMEDIATE MEDICAL ATTENTION IF: You have more than a spotting of blood in your stool.  Your belly is swollen (abdominal distention).  You are nauseated or vomiting.  You have a temperature over 101.  You have abdominal pain or discomfort that is severe or gets worse throughout the day.   Your colonoscopy revealed 5 polyp(s) which I removed successfully. Await pathology results, my office will contact you. I recommend repeating colonoscopy in 3-5 years for surveillance purposes. You also have diverticulosis and internal hemorrhoids. I would recommend increasing fiber in your diet or adding OTC Benefiber/Metamucil. Be sure to drink at least 4 to 6 glasses of water daily. Follow-up with GI in 4 months.    I hope you have a great rest of your week!  Elon Alas. Abbey Chatters, D.O. Gastroenterology and Hepatology [facility]

## 2021-10-18 NOTE — Transfer of Care (Signed)
Immediate Anesthesia Transfer of Care Note  Patient: Randy Hebert  Procedure(s) Performed: COLONOSCOPY WITH PROPOFOL POLYPECTOMY  Patient Location: PACU  Anesthesia Type:General  Level of Consciousness: awake, alert  and oriented  Airway & Oxygen Therapy: Patient Spontanous Breathing and Patient connected to nasal cannula oxygen  Post-op Assessment: Report given to RN and Post -op Vital signs reviewed and stable  Post vital signs: Reviewed and stable  Last Vitals:  Vitals Value Taken Time  BP 105/58   Temp    Pulse 79 10/18/21 0759  Resp 24 10/18/21 0759  SpO2 97 % 10/18/21 0759  Vitals shown include unvalidated device data.  Last Pain:  Vitals:   10/18/21 0736  TempSrc:   PainSc: 0-No pain         Complications: No notable events documented.

## 2021-10-21 DIAGNOSIS — G4733 Obstructive sleep apnea (adult) (pediatric): Secondary | ICD-10-CM | POA: Diagnosis not present

## 2021-10-21 LAB — SURGICAL PATHOLOGY

## 2021-10-22 ENCOUNTER — Encounter (HOSPITAL_COMMUNITY): Payer: Self-pay | Admitting: Internal Medicine

## 2021-10-23 ENCOUNTER — Other Ambulatory Visit: Payer: Self-pay

## 2021-10-23 ENCOUNTER — Encounter: Payer: Self-pay | Admitting: "Endocrinology

## 2021-10-23 ENCOUNTER — Ambulatory Visit (INDEPENDENT_AMBULATORY_CARE_PROVIDER_SITE_OTHER): Payer: Medicare Other | Admitting: "Endocrinology

## 2021-10-23 VITALS — BP 124/72 | HR 84 | Ht 65.0 in | Wt 204.4 lb

## 2021-10-23 DIAGNOSIS — E782 Mixed hyperlipidemia: Secondary | ICD-10-CM | POA: Diagnosis not present

## 2021-10-23 DIAGNOSIS — F17200 Nicotine dependence, unspecified, uncomplicated: Secondary | ICD-10-CM | POA: Diagnosis not present

## 2021-10-23 DIAGNOSIS — E1159 Type 2 diabetes mellitus with other circulatory complications: Secondary | ICD-10-CM | POA: Diagnosis not present

## 2021-10-23 DIAGNOSIS — I10 Essential (primary) hypertension: Secondary | ICD-10-CM | POA: Diagnosis not present

## 2021-10-23 NOTE — Progress Notes (Signed)
10/23/2021, 12:59 PM      Endocrinology follow-up note    Subjective:    Patient ID: Randy Hebert, male    DOB: Aug 11, 1966.  Randy Hebert is being seen in  follow-up for management of currently uncontrolled symptomatic type 2 diabetes, hyperlipidemia, hypertension. PMD:  Lindell Spar, MD.   Past Medical History:  Diagnosis Date   ACE inhibitor-aggravated angioedema    Allergy    Angio-edema    Anxiety    Arthritis    Asthma    hip replacement   Back pain    Bradycardia 04/28/2017   Bulging of cervical intervertebral disc    CAD (coronary artery disease)    lateral STEMI 02/06/2015 00% D1 occlusion treated with Promus Premier 2.5 mm x 16 mm DES, 70% ramus stenosis, 40% mid RCA stenosis, 45% distal RCA stenosis, EF 45-50%   CHF (congestive heart failure) (HCC)    COPD (chronic obstructive pulmonary disease) ([place])    Depression    Diabetes mellitus without complication ([place])    Difficult intubation    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   Dry eye    Dyspnea    Early satiety 09/23/2016   GERD (gastroesophageal reflux disease)    HCAP (healthcare-associated pneumonia) 05/15/2017   Headache    Heart murmur    Hip pain    Hyperlipidemia    Hypertension    Lobar pneumonia ([place]) 05/15/2017   Melena 08/04/2018   MI (myocardial infarction) ([place])    Myocardial infarction ([place])    Neck pain    Non-ST elevation (NSTEMI) myocardial infarction ([place]) 04/27/2017   NSTEMI (non-ST elevated myocardial infarction) ([place]) 04/26/2017   Otitis media    Pleurisy    Pneumonia due to COVID-19 virus 10/07/2019   Rectal bleeding 11/08/2018   Right shoulder pain 03/20/2020   Sinus pause    9 sec sinus pause on telemetry after started on coreg after MI, avoid AV nodal blocking agent   Sleep apnea    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   STEMI (ST elevation myocardial infarction) ([place]) 05/31/2015   Substance abuse ([place]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    Syncope 12/87/8676   Syncope and collapse 05/15/2017   Transaminitis 08/04/2018   Unilateral primary osteoarthritis, right hip 07/01/2016   Past Surgical History:  Procedure Laterality Date   BIOPSY  10/09/2016   Procedure: BIOPSY;  Surgeon: Daneil Dolin, MD;  [facility];  Service: Endoscopy;;   BIOPSY  11/28/2019   Procedure: BIOPSY;  Surgeon: Daneil Dolin, MD;  [facility];  Service: Endoscopy;;   CARDIAC CATHETERIZATION N/A 02/06/2015   Procedure: Left Heart Cath and Coronary Angiography;  Surgeon: Leonie Man, MD;  Location: [facility];  Service: Cardiovascular;  Laterality: N/A;   CARDIAC CATHETERIZATION N/A 02/06/2015   Procedure: Coronary Stent Intervention;  Surgeon: Leonie Man, MD;  Location: [facility];  Service: Cardiovascular;  Laterality: N/A;   COLONOSCOPY WITH PROPOFOL N/A 10/09/2016   Sigmoid and descending colon diverticulosis, four 4-6 mm polyps in sigmoid, one 4 mm polyp in descending. Tubular adenomas and hyperplastic. 5 year surveillance.    COLONOSCOPY WITH PROPOFOL N/A 11/24/2016   Sigmoid and descending colon diverticulosis, four 4-6 mm polyps in sigmoid, one 4 mm polyp in descending. Tubular adenomas and hyperplastic. 5 year surveillance.    COLONOSCOPY WITH PROPOFOL N/A 10/18/2021   Procedure: COLONOSCOPY  WITH PROPOFOL;  Surgeon: Eloise Harman, DO;  [facility];  Service: Endoscopy;  Laterality: N/A;  7:30am   CORONARY ANGIOPLASTY WITH STENT PLACEMENT  01/2015   CORONARY STENT INTERVENTION N/A 04/27/2017   Procedure: CORONARY STENT INTERVENTION;  Surgeon: Nelva Bush, MD;  Location: [facility];  Service: Cardiovascular;  Laterality: N/A;   ELECTROPHYSIOLOGY STUDY N/A 06/29/2017   Procedure: ELECTROPHYSIOLOGY STUDY;  Surgeon: Evans Lance, MD;  Location: [facility];  Service: Cardiovascular;  Laterality: N/A;   ESOPHAGOGASTRODUODENOSCOPY (EGD) WITH PROPOFOL N/A 10/09/2016    Dr. Gala Romney: LA grade a esophagitis.  Barrett's esophagus, biopsy-proven.  Small hiatal hernia.  EGD February 2019   ESOPHAGOGASTRODUODENOSCOPY (EGD) WITH PROPOFOL N/A 11/28/2019    salmon-colored esophageal mucosa (Barrett's) small hiatal hernia, portal hypertensive gastropathy, normal duodenum, 3 year surveillance   ESOPHAGOGASTRODUODENOSCOPY (EGD) WITH PROPOFOL N/A 09/06/2021   three columns of grade 1 varices in distal esophagus, no stigmata of bleeding or red wale signs. Small hiatal hernia. Mild portal gastropathy. Normal duodenum. Repeat upper endoscopy in 1 year for surveillance.   INCISION / DRAINAGE HAND / FINGER     LEFT HEART CATH AND CORONARY ANGIOGRAPHY N/A 04/27/2017   Procedure: LEFT HEART CATH AND CORONARY ANGIOGRAPHY;  Surgeon: Nelva Bush, MD;  Location: [facility];  Service: Cardiovascular;  Laterality: N/A;   LOOP RECORDER INSERTION  06/29/2017   Procedure: Loop Recorder Insertion;  Surgeon: Evans Lance, MD;  Location: [facility];  Service: Cardiovascular;;   POLYPECTOMY  11/24/2016   Procedure: POLYPECTOMY;  Surgeon: Daneil Dolin, MD;  [facility];  Service: Endoscopy;;  descending and sigmoid   POLYPECTOMY  10/18/2021   Procedure: POLYPECTOMY;  Surgeon: Eloise Harman, DO;  [facility];  Service: Endoscopy;;   TOTAL HIP ARTHROPLASTY Right 07/01/2016   TOTAL HIP ARTHROPLASTY Right 07/01/2016   Procedure: RIGHT TOTAL HIP ARTHROPLASTY ANTERIOR APPROACH;  Surgeon: Mcarthur Rossetti, MD;  Location: [facility];  Service: Orthopedics;  Laterality: Right;   Social History   Socioeconomic History   Marital status: Divorced    Spouse name: alice   Number of children: 3   Years of education: 12   Highest education level: Not on file  Occupational History   Occupation: SSI  Tobacco Use   Smoking status: Every Day    Packs/day: 0.50    Years: 46.00    Pack years: 23.00    Types: Cigarettes   Smokeless tobacco: Former     Types: Nurse, children's Use: Former  Substance and Sexual Activity   Alcohol use: Not Currently    Comment: couple beers occ- stopped 09/2021   Drug use: No    Comment: history of drug use- marijuana, cocaine- quit 2014   Sexual activity: Yes    Partners: Female    Birth control/protection: None  Other Topics Concern   Not on file  Social History Narrative   Lives with girlfriend [name]   3 children, but 1 OD  In 2019.   Grandchildren-2      Two cats: may and princess; and a dog-foxy      Enjoy: spending time with pets, TV, yard work       Diet: eats all food groups   Caffeine: half and half coffee, some tea-half and half decaf   Water: 6-8 cups daily      Wears seat belt   Does not use phone while  driving   Smoke detectors at home      Social Determinants of Health   Financial Resource Strain: Low Risk    Difficulty of Paying Living Expenses: Not hard at all  Food Insecurity: No Food Insecurity   Worried About Charity fundraiser in the Last Year: Never true   Arboriculturist in the Last Year: Never true  Transportation Needs: No Transportation Needs   Lack of Transportation (Medical): No   Lack of Transportation (Non-Medical): No  Physical Activity: Insufficiently Active   Days of Exercise per Week: 3 days   Minutes of Exercise per Session: 20 min  Stress: No Stress Concern Present   Feeling of Stress : Not at all  Social Connections: Moderately Integrated   Frequency of Communication with Friends and Family: More than three times a week   Frequency of Social Gatherings with Friends and Family: More than three times a week   Attends Religious Services: More than 4 times per year   Active Member of [employer] or Organizations: Yes   Attends Archivist Meetings: More than 4 times per year   Marital Status: Divorced   Outpatient Encounter Medications as of 10/23/2021  Medication Sig   acetaminophen (TYLENOL) 500 MG tablet Take 2 tablets (1,000 mg  total) by mouth every 8 (eight) hours as needed for mild pain or fever.   albuterol (PROVENTIL) (2.5 MG/3ML) 0.083% nebulizer solution Take 3 mLs (2.5 mg total) by nebulization every 6 (six) hours as needed for wheezing or shortness of breath.   albuterol (VENTOLIN HFA) 108 (90 Base) MCG/ACT inhaler INHALE 2 PUFFS INTO THE LUNGS EVERY 6 HOURS AS NEEDEDFOR SHORTNESS OF BREATH OR WHEEZING.   amLODipine (NORVASC) 10 MG tablet Take 1 tablet (10 mg total) by mouth daily.   ascorbic acid (VITAMIN C) 500 MG tablet Take 1 tablet (500 mg total) by mouth daily.   aspirin 81 MG EC tablet Take 1 tablet (81 mg total) by mouth daily. (Patient taking differently: Take 81 mg by mouth in the morning and at bedtime.)   atorvastatin (LIPITOR) 40 MG tablet TAKE 1 TABLET BY MOUTH ONCE A DAY. (Patient taking differently: Take 40 mg by mouth at bedtime.)   Azelastine HCl 137 MCG/SPRAY SOLN USE 2 SPRAYS IN EACH NOSTRIL TWICE DAILY.   budesonide-formoterol (SYMBICORT) 160-4.5 MCG/ACT inhaler Inhale 2 puffs into the lungs 2 (two) times daily.   Cholecalciferol (VITAMIN D3) 125 MCG (5000 UT) CAPS Take 1 capsule (5,000 Units total) by mouth daily.   citalopram (CELEXA) 20 MG tablet Take 20 mg by mouth daily.   cycloSPORINE (RESTASIS) 0.05 % ophthalmic emulsion Place 1 drop into both eyes 2 (two) times daily.   Dextromethorphan-guaiFENesin ([place] FAST-MAX DM MAX) 5-100 MG/5ML LIQD Take 5 mLs by mouth daily as needed (cough/congestion).   FARXIGA 10 MG TABS tablet TAKE ONE TABLET BY MOUTH ONCE DAILY.   ferrous sulfate 325 (65 FE) MG tablet Take 1 tablet (325 mg total) by mouth daily with breakfast.   furosemide (LASIX) 40 MG tablet Take 1 tablet (40 mg total) by mouth daily as needed for fluid.   glipiZIDE (GLUCOTROL XL) 5 MG 24 hr tablet TAKE 1 TABLET BY MOUTH DAILY WITH BREAKFAST.   glucose blood (ACCU-CHEK AVIVA PLUS) test strip Use as instructed to check blood glucose three times daily.   lactulose (CHRONULAC) 10 GM/15ML  solution Take 10 g by mouth daily.   magnesium gluconate (MAGONATE) 500 MG tablet Take 500 mg by mouth  2 (two) times daily.   Melatonin 10 MG CAPS Take 10 mg by mouth at bedtime.   metFORMIN (GLUCOPHAGE) 500 MG tablet Take 1 tablet (500 mg total) by mouth 2 (two) times daily with a meal.   mometasone (ELOCON) 0.1 % cream Apply 1 application topically daily as needed (eczema on ears).   nitroGLYCERIN (NITROSTAT) 0.4 MG SL tablet Place 1 tablet (0.4 mg total) under the tongue every 5 (five) minutes as needed for chest pain.   olopatadine (PATANOL) 0.1 % ophthalmic solution Place 1 drop into both eyes 2 (two) times daily.   pantoprazole (PROTONIX) 40 MG tablet TAKE (1) TABLET BY MOUTH TWICE DAILY BEFORE A MEAL.   polyethylene glycol-electrolytes (TRILYTE) 420 g solution Take 4,000 mLs by mouth as directed.   Tiotropium Bromide Monohydrate (SPIRIVA RESPIMAT) 2.5 MCG/ACT AERS Inhale 1 puff into the lungs daily.   traZODone (DESYREL) 50 MG tablet Take 0.5-1 tablets (25-50 mg total) by mouth at bedtime as needed for sleep.   triamcinolone ointment (KENALOG) 0.1 % Apply 1 application topically 2 (two) times daily. (Patient taking differently: Apply 1 application topically 2 (two) times daily as needed (hives).)   TRULICITY 1.5 WI/0.9BD SOPN INJECT 1 1/2 MG INTO THE SKIN ONCE WEEKLY. (Patient taking differently: Inject 1.5 mg as directed every Monday.)   No facility-administered encounter medications on file as of 10/23/2021.    ALLERGIES: Allergies  Allergen Reactions   Carvedilol Other (See Comments)    Sinus pause on telemetry >3 seconds. Longest one 9 sec. No AV nodal agent   Lisinopril Anaphylaxis, Shortness Of Breath and Swelling    Angioedema, required intubation and mechanical ventilation   Chantix [Varenicline] Other (See Comments)    Nightmares and unable to sleep well   Amoxicillin Nausea Only    Did it involve swelling of the face/tongue/throat, SOB, or low BP? No Did it involve sudden  or severe rash/hives, skin peeling, or any reaction on the inside of your mouth or nose? No Did you need to seek medical attention at a hospital or doctor's office? No When did it last happen? childhood reaction      If all above answers are "NO", may proceed with cephalosporin use.     VACCINATION STATUS: Immunization History  Administered Date(s) Administered   Influenza,inj,Quad PF,6+ Mos 07/03/2016, 05/07/2017, 06/20/2020, 07/24/2021   Influenza-Unspecified 05/17/2018   Moderna SARS-COV2 Booster Vaccination 03/01/2020   Moderna Sars-Covid-2 Vaccination 02/20/2020   Pneumococcal Conjugate-13 06/17/2017   Pneumococcal Polysaccharide-23 02/07/2015   Tdap 05/17/2019    Diabetes He presents for his follow-up diabetic visit. He has type 2 diabetes mellitus. Onset time: He was diagnosed at approximate age of 26 years. His disease course has been improving. There are no hypoglycemic associated symptoms. Pertinent negatives for hypoglycemia include no confusion, headaches, pallor or seizures. Pertinent negatives for diabetes include no chest pain, no fatigue, no polydipsia, no polyphagia, no polyuria and no weakness. There are no hypoglycemic complications. Symptoms are improving. Diabetic complications include heart disease and peripheral neuropathy. Risk factors for coronary artery disease include dyslipidemia, diabetes mellitus, hypertension, male sex, family history, obesity, sedentary lifestyle and tobacco exposure. Current diabetic treatment includes oral agent (monotherapy). He is compliant with treatment most of the time. His weight is fluctuating minimally. He is following a generally unhealthy diet. When asked about meal planning, he reported none. He has had a previous visit with a dietitian. He never participates in exercise. His home blood glucose trend is decreasing steadily. His breakfast blood glucose  range is generally 140-180 mg/dl. His bedtime blood glucose range is generally  180-200 mg/dl. His overall blood glucose range is 180-200 mg/dl. (He presents with overall improvement in his A1c to 6.8%.  His average blood glucose is 190 for the last 30 days.  He did not document any recent hypoglycemia.    ) An ACE inhibitor/angiotensin II receptor blocker is contraindicated (He has documented allergy for ACE inhibitor's). He does not see a podiatrist.Eye exam is not current.  Hyperlipidemia This is a chronic problem. The current episode started more than 1 year ago. Exacerbating diseases include diabetes and obesity. Pertinent negatives include no chest pain, myalgias or shortness of breath. Current antihyperlipidemic treatment includes statins. Risk factors for coronary artery disease include dyslipidemia, diabetes mellitus, family history, obesity, male sex, hypertension and a sedentary lifestyle.  Hypertension This is a chronic problem. The current episode started more than 1 year ago. Pertinent negatives include no chest pain, headaches, neck pain, palpitations or shortness of breath. Risk factors for coronary artery disease include diabetes mellitus, dyslipidemia, obesity, sedentary lifestyle and smoking/tobacco exposure. Past treatments include calcium channel blockers. Hypertensive end-organ damage includes CAD/MI.   Review of systems    Objective:    BP 124/72    Pulse 84    Ht 5\' 5"  (1.651 m)    Wt 204 lb 6.4 oz (92.7 kg)    BMI 34.01 kg/m   Wt Readings from Last 3 Encounters:  10/23/21 204 lb 6.4 oz (92.7 kg)  10/18/21 202 lb (91.6 kg)  10/16/21 202 lb (91.6 kg)     Physical Exam- Limited   CMP     Component Value Date/Time   NA 139 09/13/2021 0955   K 5.1 09/13/2021 0955   CL 100 09/13/2021 0955   CO2 24 09/13/2021 0955   GLUCOSE 162 (H) 09/13/2021 0955   GLUCOSE 140 (H) 09/06/2021 0704   BUN 9 09/13/2021 0955   CREATININE 0.74 (L) 09/13/2021 0955   CREATININE 0.67 (L) 06/21/2020 0707   CALCIUM 9.9 09/13/2021 0955   PROT 7.3 09/13/2021 0955    ALBUMIN 4.0 09/13/2021 0955   AST 29 09/13/2021 0955   ALT 27 09/13/2021 0955   ALKPHOS 179 (H) 09/13/2021 0955   BILITOT 0.5 09/13/2021 0955   GFRNONAA >60 09/06/2021 0704   GFRNONAA 108 06/21/2020 0707   GFRAA 127 10/24/2020 0811   GFRAA 125 06/21/2020 0707    Diabetic Labs (most recent): Lab Results  Component Value Date   HGBA1C 6.8 (H) 09/03/2021   HGBA1C 7.3 (A) 07/22/2021   HGBA1C 9.0 (H) 04/11/2021     Lipid Panel ( most recent) Lipid Panel     Component Value Date/Time   CHOL 124 04/11/2021 0819   TRIG 118 04/11/2021 0819   HDL 28 (L) 04/11/2021 0819   CHOLHDL 7.1 (H) 06/21/2020 0707   VLDL 41 (H) 04/27/2017 0505   LDLCALC 74 04/11/2021 0819   LDLCALC 116 (H) 06/21/2020 0707      Lab Results  Component Value Date   TSH 3.61 06/21/2020   TSH 1.58 11/03/2018   TSH 1.79 11/17/2017   TSH 1.635 04/26/2017   TSH 2.564 06/27/2015   FREET4 1.5 11/03/2018   FREET4 1.4 11/17/2017      Assessment & Plan:   1. DM type 2 causing vascular disease ([place])  - Randy Hebert has currently uncontrolled symptomatic type 2 DM since  56 years of age.  He presents with overall improvement in his A1c to 6.8%.  His average blood glucose is 190 for the last 30 days.  He did not document any recent hypoglycemia.     -his diabetes is complicated by nonadherence to dietary recommendations, obesity/sedentary life, continued heavy smoking.  He also has recent diagnosis of CHF, coronary artery disease which required stent placement,  and Randy Hebert remains at extremely high risk for more acute and chronic complications which include CAD, CVA, CKD, retinopathy, and neuropathy. These are all discussed in detail with the patient.  - I have counseled him on diet management and weight loss, by adopting a carbohydrate restricted/protein rich diet. -Still admits to dietary indiscretions including consumption of sweets and sweetened beverages.  - he acknowledges that there is a  room for improvement in his food and drink choices. - Suggestion is made for him to avoid simple carbohydrates  from his diet including Cakes, Sweet Desserts, Ice Cream, Soda (diet and regular), Sweet Tea, Candies, Chips, Cookies, Store Bought Juices, Alcohol in Excess of  1-2 drinks a day, Artificial Sweeteners,  Coffee Creamer, and "Sugar-free" Products, Lemonade. This will help patient to have more stable blood glucose profile and potentially avoid unintended weight gain.  - I encouraged him to switch to  unprocessed or minimally processed complex starch and increased protein intake (animal or plant source), fruits, and vegetables.  - he is advised to stick to a routine mealtimes to eat 3 meals  a day and avoid unnecessary snacks ( to snack only to correct hypoglycemia).    - I have approached him with the following individualized plan to manage diabetes and patient agrees:   -In light of his presentation with near target glycemic profile, he will not need insulin treatment for now.  He is being treated with Farxiga 10 mg from his cardiologist.  He is advised to continue, along with glipizide 5 mg XL p.o. daily at breakfast.  He is also benefiting from Trulicity 1.5 mg subcutaneously weekly.    He is approached to continue monitoring blood glucose at least twice a day-daily before breakfast and at bedtime.   Single most important intervention would be smoking cessation and dietary change for him.  I discussed [employer] plant predominant lifestyle nutrition with him. -The fact that he is a heavy smoker complicates optimal utility of GLP-1 receptor agonists due to risk of pancreatitis.  -He is encouraged to call clinic for blood glucose readings less than 70 or greater than 200x3 in a week.    -Patient specific target  A1c;  LDL, HDL, Triglycerides, and  Waist Circumference were discussed in detail.  2) BP/HTN:  -His blood pressure is controlled to target.  -He remains a chronic heavy  smoker. He has a documented allergy of ACE inhibitors anaphylaxis .  He is advised to continue amlodipine 10 mg p.o. daily, with as needed diuretics.  He is extensively counseled for smoking cessation.     3) Lipids/HPL: Recent labs show LDL of 116, increasing from 74.  This is indicative of his inconsistency taking his medications.  I have reemphasized the need for continued treatment with statins.  He is currently on atorvastatin 40 mg p.o. nightly.the above discussed diet program will also help with dyslipidemia and hypertension.  His liver enzymes have improved on the lower dose of atorvastatin.  He is advised to avoid fried food, and saturated/trans fats. The patient was counseled on the dangers of tobacco use, and was advised to quit.  Reviewed strategies to maximize success, including removing cigarettes and smoking  materials from environment.   4)  Weight/Diet: His BMI is 34- abdominal obesity clearly complicating his diabetes care.  He is a candidate for modest weight loss.  CDE Consult has been initiated and in progress,  exercise, and detailed carbohydrates information provided.  5) Chronic Care/Health Maintenance:  -he  is on Statin medications and  is encouraged to continue to follow up with Ophthalmology, Dentist,  Podiatrist at least yearly or according to recommendations, and advised to  Quit smoking ( is a chronic heavy smoker for the last 40 days), and consider weaning off of alcohol.  I have recommended yearly flu vaccine and pneumonia vaccination at least every 5 years; moderate intensity exercise for up to 150 minutes weekly; and  sleep for at least 7 hours a day.   His recent screening ABI was negative for PAD in September 2022.  His study will be repeated in 5 years or sooner if needed.    - I advised patient to maintain close follow up with Lindell Spar, MD for primary care needs.    I spent 34 minutes in the care of the patient today including review of labs from  [facility], Lipids, Thyroid Function, Hematology (current and previous including abstractions from other facilities); face-to-face time discussing  his blood glucose readings/logs, discussing hypoglycemia and hyperglycemia episodes and symptoms, medications doses, his options of short and long term treatment based on the latest standards of care / guidelines;  discussion about incorporating lifestyle medicine;  and documenting the encounter.    Please refer to Patient Instructions for Blood Glucose Monitoring and Insulin/Medications Dosing Guide"  in media tab for additional information. Please  also refer to " Patient Self Inventory" in the Media  tab for reviewed elements of pertinent patient history.  Randy Hebert participated in the discussions, expressed understanding, and voiced agreement with the above plans.  All questions were answered to his satisfaction. he is encouraged to contact clinic should he have any questions or concerns prior to his return visit.    Follow up plan: - Return in about 4 months (around 02/20/2022) for Bring Meter and Logs- A1c in Office.  Glade Lloyd, MD [facility] [address] [place], [place] [address] Phone: [phone]  Fax: [phone]    10/23/2021, 12:59 PM  This note was partially dictated with voice recognition software. Similar sounding words can be transcribed inadequately or may not  be corrected upon review.

## 2021-10-23 NOTE — Patient Instructions (Signed)

## 2021-10-24 ENCOUNTER — Ambulatory Visit: Payer: Medicare Other | Admitting: Gastroenterology

## 2021-10-28 ENCOUNTER — Ambulatory Visit: Payer: Medicare Other | Admitting: Cardiology

## 2021-10-28 ENCOUNTER — Ambulatory Visit (INDEPENDENT_AMBULATORY_CARE_PROVIDER_SITE_OTHER): Payer: Medicare Other | Admitting: Family Medicine

## 2021-10-28 ENCOUNTER — Other Ambulatory Visit: Payer: Self-pay

## 2021-10-28 DIAGNOSIS — Z0000 Encounter for general adult medical examination without abnormal findings: Secondary | ICD-10-CM | POA: Diagnosis not present

## 2021-10-28 NOTE — Patient Instructions (Signed)
°  Randy Hebert , Thank you for taking time to come for your Medicare Wellness Visit. I appreciate your ongoing commitment to your health goals. Please review the following plan we discussed and let me know if I can assist you in the future.   These are the goals we discussed:  Goals      Medication Management     Patient Goals/Self-Care Activities Over the next 90 days, patient will:  Take medications as prescribed Focus on medication adherence by keeping up with prescription refills and either using a pill box or reminders to take your medications at the prescribed times Check blood sugar three times a day at the following times: fasting (at least 8 hours since last food consumption), 1-2 hours after breakfast, bedtime, and whenever patient experiences symptoms of hypo/hyperglycemia, document, and provide at future appointments Check blood pressure at least once daily, document, and provide at future appointments Weigh daily, and contact provider if weight gain of more than 3 lbs in 1 day or more than 5 lbs in 1 week Target a minimum of 150 minutes of moderate intensity exercise weekly Engage in dietary modifications by fewer sweetened foods & beverages, better food choices, and watch portion sizes/amount of food eaten at one time     Quit Smoking     Decrease smoking and plans on quitting        This is a list of the screening recommended for you and due dates:  Health Maintenance  Topic Date Due   Zoster (Shingles) Vaccine (1 of 2) Never done   COVID-19 Vaccine (2 - Moderna series) 03/29/2020   Complete foot exam   06/20/2021   Hemoglobin A1C  03/03/2022   Eye exam for diabetics  06/25/2022   Urine Protein Check  07/24/2022   Tetanus Vaccine  05/16/2029   Colon Cancer Screening  10/19/2031   Flu Shot  Completed   Hepatitis C Screening: USPSTF Recommendation to screen - Ages 18-79 yo.  Completed   HIV Screening  Completed   HPV Vaccine  Aged Out

## 2021-10-28 NOTE — Progress Notes (Signed)
I connected with  [name] on 10/28/21 by a audio enabled telemedicine application and verified that I am speaking with the correct person using two identifiers.  Patient Location: Home  Provider Location: Office/Clinic  I discussed the limitations of evaluation and management by telemedicine. The patient expressed understanding and agreed to proceed.  Subjective:   Randy Hebert is a 56 y.o. male who presents for Medicare Annual/Subsequent preventive examination.  Review of Systems              Cardiac Risk Factors include: advanced age (>40men, >37 women);diabetes mellitus;obesity (BMI >30kg/m2);sedentary lifestyle;smoking/ tobacco exposure;male gender     Objective:    Today's Vitals   10/28/21 0823  PainSc: 0-No pain   There is no height or weight on file to calculate BMI.  Advanced Directives 10/28/2021 10/18/2021 10/16/2021 09/03/2021 09/02/2021 10/24/2020 11/28/2019  Does Patient Have a Medical Advance Directive? Yes No No No No Yes No  Type of Advance Directive Living will;Healthcare Power of [place];Living will -  Does patient want to make changes to medical advance directive? No - Patient declined - - - - - -  Copy of [place] in Chart? Yes - validated most recent copy scanned in chart (See row information) - - - - No - copy requested -  Would patient like information on creating a medical advance directive? No - Patient declined - No - Patient declined No - Patient declined No - Patient declined No - Patient declined No - Patient declined    Current Medications (verified) Outpatient Encounter Medications as of 10/28/2021  Medication Sig   acetaminophen (TYLENOL) 500 MG tablet Take 2 tablets (1,000 mg total) by mouth every 8 (eight) hours as needed for mild pain or fever.   albuterol (PROVENTIL) (2.5 MG/3ML) 0.083% nebulizer solution Take 3 mLs (2.5 mg total) by nebulization every 6 (six) hours as needed for  wheezing or shortness of breath.   albuterol (VENTOLIN HFA) 108 (90 Base) MCG/ACT inhaler INHALE 2 PUFFS INTO THE LUNGS EVERY 6 HOURS AS NEEDEDFOR SHORTNESS OF BREATH OR WHEEZING.   amLODipine (NORVASC) 10 MG tablet Take 1 tablet (10 mg total) by mouth daily.   ascorbic acid (VITAMIN C) 500 MG tablet Take 1 tablet (500 mg total) by mouth daily.   aspirin 81 MG EC tablet Take 1 tablet (81 mg total) by mouth daily. (Patient taking differently: Take 81 mg by mouth in the morning and at bedtime.)   atorvastatin (LIPITOR) 40 MG tablet TAKE 1 TABLET BY MOUTH ONCE A DAY. (Patient taking differently: Take 40 mg by mouth at bedtime.)   Azelastine HCl 137 MCG/SPRAY SOLN USE 2 SPRAYS IN EACH NOSTRIL TWICE DAILY.   budesonide-formoterol (SYMBICORT) 160-4.5 MCG/ACT inhaler Inhale 2 puffs into the lungs 2 (two) times daily.   Cholecalciferol (VITAMIN D3) 125 MCG (5000 UT) CAPS Take 1 capsule (5,000 Units total) by mouth daily.   citalopram (CELEXA) 20 MG tablet Take 20 mg by mouth daily.   cycloSPORINE (RESTASIS) 0.05 % ophthalmic emulsion Place 1 drop into both eyes 2 (two) times daily.   Dextromethorphan-guaiFENesin ([place] FAST-MAX DM MAX) 5-100 MG/5ML LIQD Take 5 mLs by mouth daily as needed (cough/congestion).   FARXIGA 10 MG TABS tablet TAKE ONE TABLET BY MOUTH ONCE DAILY.   ferrous sulfate 325 (65 FE) MG tablet Take 1 tablet (325 mg total) by mouth daily with breakfast.   furosemide (LASIX) 40 MG tablet Take 1  tablet (40 mg total) by mouth daily as needed for fluid.   glipiZIDE (GLUCOTROL XL) 5 MG 24 hr tablet TAKE 1 TABLET BY MOUTH DAILY WITH BREAKFAST.   glucose blood (ACCU-CHEK AVIVA PLUS) test strip Use as instructed to check blood glucose three times daily.   lactulose (CHRONULAC) 10 GM/15ML solution Take 10 g by mouth daily.   magnesium gluconate (MAGONATE) 500 MG tablet Take 500 mg by mouth 2 (two) times daily.   Melatonin 10 MG CAPS Take 10 mg by mouth at bedtime.   metFORMIN (GLUCOPHAGE) 500  MG tablet Take 1 tablet (500 mg total) by mouth 2 (two) times daily with a meal.   mometasone (ELOCON) 0.1 % cream Apply 1 application topically daily as needed (eczema on ears).   nitroGLYCERIN (NITROSTAT) 0.4 MG SL tablet Place 1 tablet (0.4 mg total) under the tongue every 5 (five) minutes as needed for chest pain.   olopatadine (PATANOL) 0.1 % ophthalmic solution Place 1 drop into both eyes 2 (two) times daily.   pantoprazole (PROTONIX) 40 MG tablet TAKE (1) TABLET BY MOUTH TWICE DAILY BEFORE A MEAL.   polyethylene glycol-electrolytes (TRILYTE) 420 g solution Take 4,000 mLs by mouth as directed.   Tiotropium Bromide Monohydrate (SPIRIVA RESPIMAT) 2.5 MCG/ACT AERS Inhale 1 puff into the lungs daily.   traZODone (DESYREL) 50 MG tablet Take 0.5-1 tablets (25-50 mg total) by mouth at bedtime as needed for sleep.   triamcinolone ointment (KENALOG) 0.1 % Apply 1 application topically 2 (two) times daily. (Patient taking differently: Apply 1 application topically 2 (two) times daily as needed (hives).)   TRULICITY 1.5 DV/7.6HY SOPN INJECT 1 1/2 MG INTO THE SKIN ONCE WEEKLY. (Patient taking differently: Inject 1.5 mg as directed every Monday.)   No facility-administered encounter medications on file as of 10/28/2021.    Allergies (verified) Carvedilol, Lisinopril, Chantix [varenicline], and Amoxicillin   History: Past Medical History:  Diagnosis Date   ACE inhibitor-aggravated angioedema    Allergy    Angio-edema    Anxiety    Arthritis    Asthma    hip replacement   Back pain    Bradycardia 04/28/2017   Bulging of cervical intervertebral disc    CAD (coronary artery disease)    lateral STEMI 02/06/2015 00% D1 occlusion treated with Promus Premier 2.5 mm x 16 mm DES, 70% ramus stenosis, 40% mid RCA stenosis, 45% distal RCA stenosis, EF 45-50%   CHF (congestive heart failure) (HCC)    COPD (chronic obstructive pulmonary disease) ([place])    Depression    Diabetes mellitus without complication  ([place])    Difficult intubation    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   Dry eye    Dyspnea    Early satiety 09/23/2016   GERD (gastroesophageal reflux disease)    HCAP (healthcare-associated pneumonia) 05/15/2017   Headache    Heart murmur    Hip pain    Hyperlipidemia    Hypertension    Lobar pneumonia ([place]) 05/15/2017   Melena 08/04/2018   MI (myocardial infarction) [facility])    Myocardial infarction ([place])    Neck pain    Non-ST elevation (NSTEMI) myocardial infarction ([place]) 04/27/2017   NSTEMI (non-ST elevated myocardial infarction) ([place]) 04/26/2017   Otitis media    Pleurisy    Pneumonia due to COVID-19 virus 10/07/2019   Rectal bleeding 11/08/2018   Right shoulder pain 03/20/2020   Sinus pause    9 sec sinus pause on telemetry after started on coreg  after MI, avoid AV nodal blocking agent   Sleep apnea    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   STEMI (ST elevation myocardial infarction) ([place]) 05/31/2015   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   Syncope 97/35/3299   Syncope and collapse 05/15/2017   Transaminitis 08/04/2018   Unilateral primary osteoarthritis, right hip 07/01/2016   Past Surgical History:  Procedure Laterality Date   BIOPSY  10/09/2016   Procedure: BIOPSY;  Surgeon: Daneil Dolin, MD;  [facility];  Service: Endoscopy;;   BIOPSY  11/28/2019   Procedure: BIOPSY;  Surgeon: Daneil Dolin, MD;  [facility];  Service: Endoscopy;;   CARDIAC CATHETERIZATION N/A 02/06/2015   Procedure: Left Heart Cath and Coronary Angiography;  Surgeon: Leonie Man, MD;  Location: [place] [facility];  Service: Cardiovascular;  Laterality: N/A;   CARDIAC CATHETERIZATION N/A 02/06/2015   Procedure: Coronary Stent Intervention;  Surgeon: Leonie Man, MD;  Location: [place] [facility];  Service: Cardiovascular;  Laterality: N/A;   COLONOSCOPY WITH PROPOFOL N/A 10/09/2016   Sigmoid and descending colon diverticulosis, four 4-6 mm  polyps in sigmoid, one 4 mm polyp in descending. Tubular adenomas and hyperplastic. 5 year surveillance.    COLONOSCOPY WITH PROPOFOL N/A 11/24/2016   Sigmoid and descending colon diverticulosis, four 4-6 mm polyps in sigmoid, one 4 mm polyp in descending. Tubular adenomas and hyperplastic. 5 year surveillance.    COLONOSCOPY WITH PROPOFOL N/A 10/18/2021   Procedure: COLONOSCOPY WITH PROPOFOL;  Surgeon: Eloise Harman, DO;  [facility];  Service: Endoscopy;  Laterality: N/A;  7:30am   CORONARY ANGIOPLASTY WITH STENT PLACEMENT  01/2015   CORONARY STENT INTERVENTION N/A 04/27/2017   Procedure: CORONARY STENT INTERVENTION;  Surgeon: Nelva Bush, MD;  Location: [place] [facility];  Service: Cardiovascular;  Laterality: N/A;   ELECTROPHYSIOLOGY STUDY N/A 06/29/2017   Procedure: ELECTROPHYSIOLOGY STUDY;  Surgeon: Evans Lance, MD;  Location: [place] [facility];  Service: Cardiovascular;  Laterality: N/A;   ESOPHAGOGASTRODUODENOSCOPY (EGD) WITH PROPOFOL N/A 10/09/2016   Dr. Gala Romney: LA grade a esophagitis.  Barrett's esophagus, biopsy-proven.  Small hiatal hernia.  EGD February 2019   ESOPHAGOGASTRODUODENOSCOPY (EGD) WITH PROPOFOL N/A 11/28/2019    salmon-colored esophageal mucosa (Barrett's) small hiatal hernia, portal hypertensive gastropathy, normal duodenum, 3 year surveillance   ESOPHAGOGASTRODUODENOSCOPY (EGD) WITH PROPOFOL N/A 09/06/2021   three columns of grade 1 varices in distal esophagus, no stigmata of bleeding or red wale signs. Small hiatal hernia. Mild portal gastropathy. Normal duodenum. Repeat upper endoscopy in 1 year for surveillance.   INCISION / DRAINAGE HAND / FINGER     LEFT HEART CATH AND CORONARY ANGIOGRAPHY N/A 04/27/2017   Procedure: LEFT HEART CATH AND CORONARY ANGIOGRAPHY;  Surgeon: Nelva Bush, MD;  Location: [place] [facility];  Service: Cardiovascular;  Laterality: N/A;   LOOP RECORDER INSERTION  06/29/2017   Procedure: Loop Recorder Insertion;   Surgeon: Evans Lance, MD;  Location: [place] [facility];  Service: Cardiovascular;;   POLYPECTOMY  11/24/2016   Procedure: POLYPECTOMY;  Surgeon: Daneil Dolin, MD;  [facility];  Service: Endoscopy;;  descending and sigmoid   POLYPECTOMY  10/18/2021   Procedure: POLYPECTOMY;  Surgeon: Eloise Harman, DO;  [facility];  Service: Endoscopy;;   TOTAL HIP ARTHROPLASTY Right 07/01/2016   TOTAL HIP ARTHROPLASTY Right 07/01/2016   Procedure: RIGHT TOTAL HIP ARTHROPLASTY ANTERIOR APPROACH;  Surgeon: Mcarthur Rossetti, MD;  Location: [place];  Service: Orthopedics;  Laterality: Right;   Family History  Problem Relation Age of Onset   Heart attack Father    Stroke Father    Arthritis Father    Heart disease Father    Cancer Mother        ???   Arthritis Mother    Heart disease Brother 67       died in sleep   Early death Brother    Diabetes Maternal Uncle    Alzheimer's disease Maternal Grandmother    Social History   Socioeconomic History   Marital status: Divorced    Spouse name: alice   Number of children: 3   Years of education: 12   Highest education level: Not on file  Occupational History   Occupation: SSI  Tobacco Use   Smoking status: Every Day    Packs/day: 0.50    Years: 46.00    Pack years: 23.00    Types: Cigarettes   Smokeless tobacco: Former    Types: Nurse, children's Use: Former  Substance and Sexual Activity   Alcohol use: Not Currently    Comment: couple beers occ- stopped 09/2021   Drug use: No    Comment: history of drug use- marijuana, cocaine- quit 2014   Sexual activity: Yes    Partners: Female    Birth control/protection: None  Other Topics Concern   Not on file  Social History Narrative   Lives with girlfriend [name]   3 children, but 1 OD  In 2019.   Grandchildren-2      Two cats: may and princess; and a dog-foxy      Enjoy: spending time with pets, TV, yard work       Diet: eats all food groups    Caffeine: half and half coffee, some tea-half and half decaf   Water: 6-8 cups daily      Wears seat belt   Does not use phone while driving   Oceanographer at home      Social Determinants of Health   Financial Resource Strain: Low Risk    Difficulty of Paying Living Expenses: Not hard at all  Food Insecurity: No Food Insecurity   Worried About Charity fundraiser in the Last Year: Never true   Arboriculturist in the Last Year: Never true  Transportation Needs: No Transportation Needs   Lack of Transportation (Medical): No   Lack of Transportation (Non-Medical): No  Physical Activity: Inactive   Days of Exercise per Week: 0 days   Minutes of Exercise per Session: 0 min  Stress: No Stress Concern Present   Feeling of Stress : Not at all  Social Connections: Moderately Integrated   Frequency of Communication with Friends and Family: More than three times a week   Frequency of Social Gatherings with Friends and Family: Twice a week   Attends Religious Services: More than 4 times per year   Active Member of [employer] or Organizations: Yes   Attends Music therapist: More than 4 times per year   Marital Status: Divorced    Tobacco Counseling Ready to quit: Not Answered Counseling given: Not Answered   Clinical Intake:  Pre-visit preparation completed: Yes  Pain : No/denies pain Pain Score: 0-No pain     Diabetes: Yes CBG done?:  (165-Blood glucose)  How often do you need to have someone help you when you read instructions, pamphlets, or other written materials  from your doctor or pharmacy?: 1 - Never What is the last grade level you completed in school?: 12th  Diabetic? Yes Nutrition Risk Assessment:  Has the patient had any N/V/D within the last 2 months?   Follow by Dr. Dorris Fetch Does the patient have any non-healing wounds?   Follow by  Dr.Nida Has the patient had any unintentional weight loss or weight gain?  Follow by Dr. Dorris Fetch  Diabetes:  Is the  patient diabetic?  Yes  If diabetic, was a CBG obtained today?  Yes  Did the patient bring in their glucometer from home?  No  How often do you monitor your CBG's? .   Financial Strains and Diabetes Management:  Are you having any financial strains with the device, your supplies or your medication? No .  Does the patient want to be seen by Chronic Care Management for management of their diabetes?  No  Would the patient like to be referred to a Nutritionist or for Diabetic Management?  No   Diabetic Exams:  Diabetic eye exam completed Diabetic Foot Exam: Overdue, Pt has been advised about the importance in completing this exam. Pt is scheduled for diabetic foot exam on next office visit.   Interpreter Needed?: No      Activities of Daily Living In your present state of health, do you have any difficulty performing the following activities: 10/28/2021 10/16/2021  Hearing? N N  Vision? Y N  Comment glasses -  Difficulty concentrating or making decisions? Y N  Comment have difficulties remembering names -  Walking or climbing stairs? N N  Dressing or bathing? N N  Doing errands, shopping? N -  Using the Toilet? N -  In the past six months, have you accidently leaked urine? N -  Do you have problems with loss of bowel control? N -  Managing your Medications? N -  Managing your Finances? N -  Housekeeping or managing your Housekeeping? N -  Some recent data might be hidden    Patient Care Team: Lindell Spar, MD as PCP - General (Internal Medicine) Evans Lance, MD as PCP - Electrophysiology (Cardiology) [name] Alphonse Guild, MD as PCP - Cardiology (Cardiology) Beryle Lathe, [facility] (Pharmacist)  Indicate any recent Medical Services you may have received from other than [facility] providers in the past year (date may be approximate).     Assessment:   This is a routine wellness examination for [place].  Hearing/Vision screen No results found.  Dietary issues and  exercise activities discussed: Current Exercise Habits: The patient does not participate in regular exercise at present, Exercise limited by: cardiac condition(s);respiratory conditions(s)   Goals Addressed             This Visit's Progress    Quit Smoking   On track    Decrease smoking and plans on quitting       Depression Screen PHQ 2/9 Scores 10/28/2021 09/30/2021 09/13/2021 07/24/2021 05/13/2021 04/24/2021 04/02/2021  PHQ - 2 Score 0 0 3 0 0 0 0  PHQ- 9 Score 4 - 10 - - - -  Exception Documentation - - - - - - -  Not completed - - - - - - -    Fall Risk Fall Risk  10/28/2021 09/30/2021 09/13/2021 07/24/2021 05/13/2021  Falls in the past year? 1 0 0 0 1  Comment 09/02/2021 - - - -  Number falls in past yr: 0 0 0 0 0  Injury with Fall? 0 0 0  0 0  Risk Factor Category  - - - - -  Comment - - - - -  Risk for fall due to : - No Fall Risks No Fall Risks No Fall Risks Impaired balance/gait  Risk for fall due to: Comment - - - - -  Follow up - Falls evaluation completed Falls evaluation completed Falls evaluation completed Falls evaluation completed    FALL RISK PREVENTION PERTAINING TO THE HOME:  Any stairs in or around the home? Yes  If so, are there any without handrails? Yes  Home free of loose throw rugs in walkways, pet beds, electrical cords, etc? Yes  Adequate lighting in your home to reduce risk of falls? Yes   ASSISTIVE DEVICES UTILIZED TO PREVENT FALLS:  Life alert? No  Use of a cane, [place] or w/c? No  Grab bars in the bathroom? Yes  Shower chair or bench in shower? Yes  Elevated toilet seat or a handicapped toilet? No   Gait steady and fast with assistive device  Cognitive Function:     6CIT Screen 10/28/2021 10/24/2020  What Year? 0 points 0 points  What month? 0 points 0 points  What time? 0 points 0 points  Count back from 20 0 points 0 points  Months in reverse 0 points 0 points  Repeat phrase 0 points 0 points  Total Score 0 0     Immunizations Immunization History  Administered Date(s) Administered   Influenza,inj,Quad PF,6+ Mos 07/03/2016, 05/07/2017, 06/20/2020, 07/24/2021   Influenza-Unspecified 05/17/2018   Moderna SARS-COV2 Booster Vaccination 03/01/2020   Moderna Sars-Covid-2 Vaccination 02/20/2020   Pneumococcal Conjugate-13 06/17/2017   Pneumococcal Polysaccharide-23 02/07/2015   Tdap 05/17/2019    TDAP status: Up to date  Flu Vaccine status: Up to date  Pneumococcal vaccine status: Up to date  Covid-19 vaccine status: Completed vaccines  Qualifies for Shingles Vaccine? yes  Zostavax completed : NO, needs second dose Shingrix Completed?: Not yet completed  Screening Tests Health Maintenance  Topic Date Due   Zoster Vaccines- Shingrix (1 of 2) Never done   COVID-19 Vaccine (2 - Moderna series) 03/29/2020   FOOT EXAM  06/20/2021   HEMOGLOBIN A1C  03/03/2022   OPHTHALMOLOGY EXAM  06/25/2022   URINE MICROALBUMIN  07/24/2022   TETANUS/TDAP  05/16/2029   COLONOSCOPY (Pts 45-77yrs Insurance coverage will need to be confirmed)  10/19/2031   INFLUENZA VACCINE  Completed   Hepatitis C Screening  Completed   HIV Screening  Completed   HPV VACCINES  Aged Out    Health Maintenance  Health Maintenance Due  Topic Date Due   Zoster Vaccines- Shingrix (1 of 2) Never done   COVID-19 Vaccine (2 - Moderna series) 03/29/2020   FOOT EXAM  06/20/2021    Colorectal cancer screening: Type of screening: Colonoscopy. Completed 2023. Repeat every 10 years  Lung Cancer Screening: (Low Dose CT Chest recommended if Age 3-80 years, 30 pack-year currently smoking OR have quit w/in 15years.) does not qualify.   Lung Cancer Screening Referral:n/a  Additional Screening:  Hepatitis C Screening: does qualify; Completed 08/19/2018  Vision Screening: Recommended annual ophthalmology exams for early detection of glaucoma and other disorders of the eye. Is the patient up to date with their annual eye exam?   Yes  Who is the provider or what is the name of the office in which the patient attends annual eye exams? \ If pt is not established with a provider, would they like to be referred to a provider to establish  care? Patient has a PCP   Dental Screening: Recommended annual dental exams for proper oral hygiene  Community Resource Referral / Chronic Care Management: CRR required this visit?  No   CCM required this visit?  No      Plan:     I have personally reviewed and noted the following in the patients chart:   Medical and social history Use of alcohol, tobacco or illicit drugs  Current medications and supplements including opioid prescriptions. Patient is not currently taking opioid prescriptions. Functional ability and status Nutritional status Physical activity Advanced directives List of other physicians Hospitalizations, surgeries, and ER visits in previous 12 months Vitals Screenings to include cognitive, depression, and falls Referrals and appointments  In addition, I have reviewed and discussed with patient certain preventive protocols, quality metrics, and best practice recommendations. A written personalized care plan for preventive services as well as general preventive health recommendations were provided to patient.     Kathlyn Sacramento, RN   10/28/2021   Nurse Notes:  Mr. Drost , Thank you for taking time to come for your Medicare Wellness Visit. I appreciate your ongoing commitment to your health goals. Please review the following plan we discussed and let me know if I can assist you in the future.   These are the goals we discussed:  Goals      Medication Management     Patient Goals/Self-Care Activities Over the next 90 days, patient will:  Take medications as prescribed Focus on medication adherence by keeping up with prescription refills and either using a pill box or reminders to take your medications at the prescribed times Check blood sugar three  times a day at the following times: fasting (at least 8 hours since last food consumption), 1-2 hours after breakfast, bedtime, and whenever patient experiences symptoms of hypo/hyperglycemia, document, and provide at future appointments Check blood pressure at least once daily, document, and provide at future appointments Weigh daily, and contact provider if weight gain of more than 3 lbs in 1 day or more than 5 lbs in 1 week Target a minimum of 150 minutes of moderate intensity exercise weekly Engage in dietary modifications by fewer sweetened foods & beverages, better food choices, and watch portion sizes/amount of food eaten at one time     Quit Smoking     Decrease smoking and plans on quitting        This is a list of the screening recommended for you and due dates:  Health Maintenance  Topic Date Due   Zoster (Shingles) Vaccine (1 of 2) Never done   COVID-19 Vaccine (2 - Moderna series) 03/29/2020   Complete foot exam   06/20/2021   Hemoglobin A1C  03/03/2022   Eye exam for diabetics  06/25/2022   Urine Protein Check  07/24/2022   Tetanus Vaccine  05/16/2029   Colon Cancer Screening  10/19/2031   Flu Shot  Completed   Hepatitis C Screening: USPSTF Recommendation to screen - Ages 18-79 yo.  Completed   HIV Screening  Completed   HPV Vaccine  Aged Out

## 2021-10-29 DIAGNOSIS — I5042 Chronic combined systolic (congestive) and diastolic (congestive) heart failure: Secondary | ICD-10-CM

## 2021-10-29 DIAGNOSIS — I5022 Chronic systolic (congestive) heart failure: Secondary | ICD-10-CM

## 2021-10-29 DIAGNOSIS — I25118 Atherosclerotic heart disease of native coronary artery with other forms of angina pectoris: Secondary | ICD-10-CM

## 2021-10-29 DIAGNOSIS — I10 Essential (primary) hypertension: Secondary | ICD-10-CM

## 2021-10-29 DIAGNOSIS — E1159 Type 2 diabetes mellitus with other circulatory complications: Secondary | ICD-10-CM

## 2021-10-29 DIAGNOSIS — J449 Chronic obstructive pulmonary disease, unspecified: Secondary | ICD-10-CM

## 2021-11-04 ENCOUNTER — Telehealth: Payer: Self-pay | Admitting: Internal Medicine

## 2021-11-04 DIAGNOSIS — J302 Other seasonal allergic rhinitis: Secondary | ICD-10-CM | POA: Diagnosis not present

## 2021-11-04 DIAGNOSIS — J984 Other disorders of lung: Secondary | ICD-10-CM | POA: Diagnosis not present

## 2021-11-04 DIAGNOSIS — J441 Chronic obstructive pulmonary disease with (acute) exacerbation: Secondary | ICD-10-CM | POA: Diagnosis not present

## 2021-11-04 NOTE — Telephone Encounter (Signed)
RECALL FOR ULTRASOUND 

## 2021-11-05 NOTE — Telephone Encounter (Signed)
Pt already had done in Jan 2023. Repeat 6 months ?

## 2021-11-06 ENCOUNTER — Other Ambulatory Visit: Payer: Self-pay

## 2021-11-06 ENCOUNTER — Telehealth: Payer: Medicare Other | Admitting: Gastroenterology

## 2021-11-06 ENCOUNTER — Telehealth: Payer: Self-pay | Admitting: *Deleted

## 2021-11-06 NOTE — Telephone Encounter (Signed)
Pt consented to a virtual visit. 

## 2021-11-06 NOTE — Telephone Encounter (Signed)
Randy Hebert, you are scheduled for a virtual visit with your provider today.  Just as we do with appointments in the office, we must obtain your consent to participate.  Your consent will be active for this visit and any virtual visit you may have with one of our providers in the next 365 days.  If you have a MyChart account, I can also send a copy of this consent to you electronically.  All virtual visits are billed to your insurance company just like a traditional visit in the office.  As this is a virtual visit, video technology does not allow for your provider to perform a traditional examination.  This may limit your provider's ability to fully assess your condition.  If your provider identifies any concerns that need to be evaluated in person or the need to arrange testing such as labs, EKG, etc, we will make arrangements to do so.  Although advances in technology are sophisticated, we cannot ensure that it will always work on either your end or our end.  If the connection with a video visit is poor, we may have to switch to a telephone visit.  With either a video or telephone visit, we are not always able to ensure that we have a secure connection.   I need to obtain your verbal consent now.   Are you willing to proceed with your visit today?  ?

## 2021-11-06 NOTE — Progress Notes (Unsigned)
Non-bleeding internal hemorrhoids. ?                          - Diverticulosis in the sigmoid colon, in the  ?                          descending colon and in the transverse colon. ?                          - One 9 mm polyp in the ascending colon, removed  ?                          with a cold snare. Resected and retrieved. ?                          - Four 3 to 5 mm polyps in the sigmoid colon, in  ?                          the descending colon and in the transverse colon,  ?                          removed with a cold snare. Resected and retrieved. ?                          - The examination was  ? ?Tubular adenomas, 5 year surveillance.  ?

## 2021-11-07 ENCOUNTER — Encounter: Payer: Self-pay | Admitting: Gastroenterology

## 2021-11-07 ENCOUNTER — Other Ambulatory Visit: Payer: Self-pay

## 2021-11-07 ENCOUNTER — Ambulatory Visit (INDEPENDENT_AMBULATORY_CARE_PROVIDER_SITE_OTHER): Payer: Medicare Other | Admitting: Gastroenterology

## 2021-11-07 VITALS — BP 134/72 | HR 96 | Temp 97.4°F | Ht 65.0 in | Wt 203.4 lb

## 2021-11-07 DIAGNOSIS — K219 Gastro-esophageal reflux disease without esophagitis: Secondary | ICD-10-CM | POA: Diagnosis not present

## 2021-11-07 DIAGNOSIS — K2270 Barrett's esophagus without dysplasia: Secondary | ICD-10-CM

## 2021-11-07 DIAGNOSIS — K922 Gastrointestinal hemorrhage, unspecified: Secondary | ICD-10-CM

## 2021-11-07 DIAGNOSIS — K7030 Alcoholic cirrhosis of liver without ascites: Secondary | ICD-10-CM

## 2021-11-07 MED ORDER — LACTULOSE 10 GM/15ML PO SOLN: 10.0000 g | Freq: Every day | ORAL | 1 refills | Status: DC

## 2021-11-07 NOTE — Patient Instructions (Signed)
It was a pleasure to meet you today.  Glad you are doing well. ? ?I am going to arrange for you to have your labs and repeat ultrasound 1 week prior to your next appointment. ? ?We would like to see you back in 4 months, around July. ? ?We are refilling your lactulose.  Please call office if you begin to have less than 2-3 bowel movements a day. ? ?It was a pleasure to see you today. I want to create trusting relationships with patients. If you receive a survey regarding your visit,  I greatly appreciate you taking time to fill this out on paper or through your MyChart. I value your feedback. ? ?Venetia Night, MSN, FNP-BC, AGACNP-BC ?[facility] ? ? ?

## 2021-11-07 NOTE — Progress Notes (Signed)
GI Office Note    Referring Provider: Lindell Spar, MD Primary Care Physician:  Lindell Spar, MD Primary GI:  Dr. Gala Romney  Date:  11/07/2021  ID:  Randy Hebert, DOB December 02, 1965, MRN [id number]   Chief Complaint   Chief Complaint  Patient presents with   Colonoscopy     History of Present Illness  Randy Hebert is a 56 y.o. male presenting today with a history of anxiety, asthma, CAD, depression, diabetes, GERD, hyperlipidemia, HTN, history of MI, alcoholic cirrhosis, Barrett's presents today for follow-up.  He was hospitalized for 4 days in January due to rectal bleeding and mild anemia.  He received blood transfusion and EGD was performed while inpatient revealing 3 columns of grade 1 varices in the distal esophagus but no stigmata of bleeding, small hiatal hernia, mild portal gastropathy.  1 year repeat recommended.  There was concern for encephalopathy during admission was started on lactulose.  His hemoglobin on discharge was 8.9.  He was last seen in the office January 18 for hospital follow-up.  At this time he was taking lactulose twice daily however he was having lots of burping and gas.  Repeat labs September 13, 2021 revealed hemoglobin improved to 10.2, normal AST and ALT, alk phos 179.   Since his last visit he has had right upper quadrant ultrasound that revealed 2 gallbladder polyps measuring up to 4.4 mm, gallbladder wall thickening with areas of ringdown artifact most compatible with adenomyomatosis, and heterogeneous echogenic liver with nodular contour compatible with cirrhosis.   Last colonoscopy October 18, 2021 with nonbleeding internal hemorrhoids, small and large mouth diverticula found in the sigmoid, descending, transverse colon.  A 9 mm sessile polyp was found in the ascending colon that was removed.  4 sessile polyps found in the sigmoid, descending, transverse colon 3 to 5 mm in size, examining otherwise.  Path revealed tubular adenomas.  Repeat due in  3 to 5 years for surveillance.   Today: Reports he feels great. Has been working part time as a Training and development officer at [employer].  He denies any abdominal pain, dysphagia, melena, hematochezia.  He reports that he has only been taking lactulose once a day his last appointment due to gas and bloating.  He has only had diarrhea a couple times after going out to eat.  He is having about 2-3 bowel movements a day that are sometimes dark but mostly brown.  He is taking iron daily.  He does not endorse any increase in abdominal girth, feels like his belly is actually softer.  Has some occasional epigastric soreness.  Denies dysphagia.  He reports that he cut back on his drinking in December, and his last drink was prior to hospitalization on January 2 he has remained abstinent.  Denies any overt peripheral swelling, states he always feels unusually after standing for long periods of time on days that he works.  Denies any chest pain or shortness of breath outside of normal.  He also reports shortness of breath sometimes at work however he has been able to control this by taking nebulizers before work and occasional inhaler use.  He has continued to use his CPAP and 4 L oxygen at night to sleep.  GERD - doing well, took tums a couple days after colonoscopy - no issues.    Smoking still - trying to quit but has been not able too. About 1 ppd.  Sleeping okay - wakes up at 4 AM to get to work.  Chief Complaint  Patient presents with   Colonoscopy    Past Medical History:  Diagnosis Date   ACE inhibitor-aggravated angioedema    Allergy    Angio-edema    Anxiety    Arthritis    Asthma    hip replacement   Back pain    Bradycardia 04/28/2017   Bulging of cervical intervertebral disc    CAD (coronary artery disease)    lateral STEMI 02/06/2015 00% D1 occlusion treated with Promus Premier 2.5 mm x 16 mm DES, 70% ramus stenosis, 40% mid RCA stenosis, 45% distal RCA stenosis, EF 45-50%   CHF (congestive heart  failure) (HCC)    COPD (chronic obstructive pulmonary disease) ([place])    Depression    Diabetes mellitus without complication ([place])    Difficult intubation    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   Dry eye    Dyspnea    Early satiety 09/23/2016   GERD (gastroesophageal reflux disease)    HCAP (healthcare-associated pneumonia) 05/15/2017   Headache    Heart murmur    Hip pain    Hyperlipidemia    Hypertension    Lobar pneumonia ([place]) 05/15/2017   Melena 08/04/2018   MI (myocardial infarction) ([place])    Myocardial infarction ([place])    Neck pain    Non-ST elevation (NSTEMI) myocardial infarction ([place]) 04/27/2017   NSTEMI (non-ST elevated myocardial infarction) ([place]) 04/26/2017   Otitis media    Pleurisy    Pneumonia due to COVID-19 virus 10/07/2019   Rectal bleeding 11/08/2018   Right shoulder pain 03/20/2020   Sinus pause    9 sec sinus pause on telemetry after started on coreg after MI, avoid AV nodal blocking agent   Sleep apnea    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   STEMI (ST elevation myocardial infarction) ([place]) 05/31/2015   Substance abuse ([place]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   Syncope 06/07/1218   Syncope and collapse 05/15/2017   Transaminitis 08/04/2018   Unilateral primary osteoarthritis, right hip 07/01/2016    Past Surgical History:  Procedure Laterality Date   BIOPSY  10/09/2016   Procedure: BIOPSY;  Surgeon: Daneil Dolin, MD;  [facility];  Service: Endoscopy;;   BIOPSY  11/28/2019   Procedure: BIOPSY;  Surgeon: Daneil Dolin, MD;  [facility];  Service: Endoscopy;;   CARDIAC CATHETERIZATION N/A 02/06/2015   Procedure: Left Heart Cath and Coronary Angiography;  Surgeon: Leonie Man, MD;  Location: [place] [facility];  Service: Cardiovascular;  Laterality: N/A;   CARDIAC CATHETERIZATION N/A 02/06/2015   Procedure: Coronary Stent Intervention;  Surgeon: Leonie Man, MD;  Location: [place] [facility];  Service:  Cardiovascular;  Laterality: N/A;   COLONOSCOPY WITH PROPOFOL N/A 10/09/2016   Sigmoid and descending colon diverticulosis, four 4-6 mm polyps in sigmoid, one 4 mm polyp in descending. Tubular adenomas and hyperplastic. 5 year surveillance.    COLONOSCOPY WITH PROPOFOL N/A 11/24/2016   Sigmoid and descending colon diverticulosis, four 4-6 mm polyps in sigmoid, one 4 mm polyp in descending. Tubular adenomas and hyperplastic. 5 year surveillance.    COLONOSCOPY WITH PROPOFOL N/A 10/18/2021   [place]: nonbleeding internal hemorrhoids, small and large mouth diverticula found in the sigmoid, descending, transverse colon.  A 9 mm sessile polyp was found in the ascending colon that was removed.  4 sessile polyps found in the sigmoid, descending, transverse colon 3 to 5 mm in size, examining otherwise.  Path revealed  tubular adenomas.  Repeat due in 3 to 5 years for surveillance.   CORONARY ANGIOPLASTY WITH STENT PLACEMENT  01/2015   CORONARY STENT INTERVENTION N/A 04/27/2017   Procedure: CORONARY STENT INTERVENTION;  Surgeon: Nelva Bush, MD;  Location: [place] [facility];  Service: Cardiovascular;  Laterality: N/A;   ELECTROPHYSIOLOGY STUDY N/A 06/29/2017   Procedure: ELECTROPHYSIOLOGY STUDY;  Surgeon: Evans Lance, MD;  Location: [place] [facility];  Service: Cardiovascular;  Laterality: N/A;   ESOPHAGOGASTRODUODENOSCOPY (EGD) WITH PROPOFOL N/A 10/09/2016   Dr. Gala Romney: LA grade a esophagitis.  Barrett's esophagus, biopsy-proven.  Small hiatal hernia.  EGD February 2019   ESOPHAGOGASTRODUODENOSCOPY (EGD) WITH PROPOFOL N/A 11/28/2019    salmon-colored esophageal mucosa (Barrett's) small hiatal hernia, portal hypertensive gastropathy, normal duodenum, 3 year surveillance   ESOPHAGOGASTRODUODENOSCOPY (EGD) WITH PROPOFOL N/A 09/06/2021   three columns of grade 1 varices in distal esophagus, no stigmata of bleeding or red wale signs. Small hiatal hernia. Mild portal gastropathy. Normal duodenum. Repeat  upper endoscopy in 1 year for surveillance.   INCISION / DRAINAGE HAND / FINGER     LEFT HEART CATH AND CORONARY ANGIOGRAPHY N/A 04/27/2017   Procedure: LEFT HEART CATH AND CORONARY ANGIOGRAPHY;  Surgeon: Nelva Bush, MD;  Location: [place] [facility];  Service: Cardiovascular;  Laterality: N/A;   LOOP RECORDER INSERTION  06/29/2017   Procedure: Loop Recorder Insertion;  Surgeon: Evans Lance, MD;  Location: [place] [facility];  Service: Cardiovascular;;   POLYPECTOMY  11/24/2016   Procedure: POLYPECTOMY;  Surgeon: Daneil Dolin, MD;  [facility];  Service: Endoscopy;;  descending and sigmoid   POLYPECTOMY  10/18/2021   Procedure: POLYPECTOMY;  Surgeon: Eloise Harman, DO;  [facility];  Service: Endoscopy;;   TOTAL HIP ARTHROPLASTY Right 07/01/2016   TOTAL HIP ARTHROPLASTY Right 07/01/2016   Procedure: RIGHT TOTAL HIP ARTHROPLASTY ANTERIOR APPROACH;  Surgeon: Mcarthur Rossetti, MD;  Location: [place];  Service: Orthopedics;  Laterality: Right;    Current Outpatient Medications  Medication Sig Dispense Refill   acetaminophen (TYLENOL) 500 MG tablet Take 2 tablets (1,000 mg total) by mouth every 8 (eight) hours as needed for mild pain or fever.     albuterol (PROVENTIL) (2.5 MG/3ML) 0.083% nebulizer solution Take 3 mLs (2.5 mg total) by nebulization every 6 (six) hours as needed for wheezing or shortness of breath. 150 mL 1   albuterol (VENTOLIN HFA) 108 (90 Base) MCG/ACT inhaler INHALE 2 PUFFS INTO THE LUNGS EVERY 6 HOURS AS NEEDEDFOR SHORTNESS OF BREATH OR WHEEZING. 18 g 1   amLODipine (NORVASC) 10 MG tablet Take 1 tablet (10 mg total) by mouth daily. 90 tablet 0   ascorbic acid (VITAMIN C) 500 MG tablet Take 1 tablet (500 mg total) by mouth daily. 30 tablet 1   aspirin 81 MG EC tablet Take 1 tablet (81 mg total) by mouth daily. (Patient taking differently: Take 81 mg by mouth in the morning and at bedtime.) 30 tablet    atorvastatin (LIPITOR) 40 MG  tablet TAKE 1 TABLET BY MOUTH ONCE A DAY. (Patient taking differently: Take 40 mg by mouth at bedtime.) 90 tablet 1   Azelastine HCl 137 MCG/SPRAY SOLN USE 2 SPRAYS IN EACH NOSTRIL TWICE DAILY. 30 mL 0   Cholecalciferol (VITAMIN D3) 125 MCG (5000 UT) CAPS Take 1 capsule (5,000 Units total) by mouth daily. 90 capsule 0   citalopram (CELEXA) 20 MG tablet Take 20 mg by mouth daily.  cycloSPORINE (RESTASIS) 0.05 % ophthalmic emulsion Place 1 drop into both eyes 2 (two) times daily.     FARXIGA 10 MG TABS tablet TAKE ONE TABLET BY MOUTH ONCE DAILY. 30 tablet 11   ferrous sulfate 325 (65 FE) MG tablet Take 1 tablet (325 mg total) by mouth daily with breakfast. 90 tablet 1   furosemide (LASIX) 40 MG tablet Take 1 tablet (40 mg total) by mouth daily as needed for fluid. 30 tablet 11   glipiZIDE (GLUCOTROL XL) 5 MG 24 hr tablet TAKE 1 TABLET BY MOUTH DAILY WITH BREAKFAST. 90 tablet 0   glucose blood (ACCU-CHEK AVIVA PLUS) test strip Use as instructed to check blood glucose three times daily. 100 each 2   lactulose (CHRONULAC) 10 GM/15ML solution Take 10 g by mouth daily.     magnesium gluconate (MAGONATE) 500 MG tablet Take 500 mg by mouth 2 (two) times daily.     metFORMIN (GLUCOPHAGE) 500 MG tablet Take 1 tablet (500 mg total) by mouth 2 (two) times daily with a meal. 60 tablet 2   mometasone (ELOCON) 0.1 % cream Apply 1 application topically daily as needed (eczema on ears).     nitroGLYCERIN (NITROSTAT) 0.4 MG SL tablet Place 1 tablet (0.4 mg total) under the tongue every 5 (five) minutes as needed for chest pain. 25 tablet 3   olopatadine (PATANOL) 0.1 % ophthalmic solution Place 1 drop into both eyes 2 (two) times daily.     pantoprazole (PROTONIX) 40 MG tablet TAKE (1) TABLET BY MOUTH TWICE DAILY BEFORE A MEAL. 60 tablet 5   polyethylene glycol-electrolytes (TRILYTE) 420 g solution Take 4,000 mLs by mouth as directed. 4000 mL 0   Tiotropium Bromide Monohydrate (SPIRIVA RESPIMAT) 2.5 MCG/ACT AERS  Inhale 1 puff into the lungs daily.     traZODone (DESYREL) 50 MG tablet Take 0.5-1 tablets (25-50 mg total) by mouth at bedtime as needed for sleep. 30 tablet 3   TRULICITY 1.5 IH/0.3UU SOPN INJECT 1 1/2 MG INTO THE SKIN ONCE WEEKLY. (Patient taking differently: Inject 1.5 mg as directed every Monday.) 2 mL 2   budesonide-formoterol (SYMBICORT) 160-4.5 MCG/ACT inhaler Inhale 2 puffs into the lungs 2 (two) times daily. (Patient not taking: Reported on 11/07/2021)     Melatonin 10 MG CAPS Take 10 mg by mouth at bedtime. (Patient not taking: Reported on 11/07/2021)     No current facility-administered medications for this visit.    Allergies as of 11/07/2021 - Review Complete 11/07/2021  Allergen Reaction Noted   Carvedilol Other (See Comments) 02/08/2015   Lisinopril Anaphylaxis, Shortness Of Breath, and Swelling 05/30/2015   Chantix [varenicline] Other (See Comments) 07/16/2021   Amoxicillin Nausea Only 02/06/2015    Family History  Problem Relation Age of Onset   Heart attack Father    Stroke Father    Arthritis Father    Heart disease Father    Cancer Mother        ???   Arthritis Mother    Heart disease Brother 29       died in sleep   Early death Brother    Diabetes Maternal Uncle    Alzheimer's disease Maternal Grandmother     Social History   Socioeconomic History   Marital status: Divorced    Spouse name: alice   Number of children: 3   Years of education: 12   Highest education level: Not on file  Occupational History   Occupation: SSI  Tobacco Use   Smoking status:  Every Day    Packs/day: 0.50    Years: 46.00    Pack years: 23.00    Types: Cigarettes   Smokeless tobacco: Former    Types: Nurse, children's Use: Former  Substance and Sexual Activity   Alcohol use: Not Currently    Comment: couple beers occ- stopped 09/2021   Drug use: No    Comment: history of drug use- marijuana, cocaine- quit 2014   Sexual activity: Yes    Partners: Female     Birth control/protection: None  Other Topics Concern   Not on file  Social History Narrative   Lives with girlfriend [name]   3 children, but 1 OD  In 2019.   Grandchildren-2      Two cats: may and princess; and a dog-foxy      Enjoy: spending time with pets, TV, yard work       Diet: eats all food groups   Caffeine: half and half coffee, some tea-half and half decaf   Water: 6-8 cups daily      Wears seat belt   Does not use phone while driving   Oceanographer at home      Social Determinants of Health   Financial Resource Strain: Low Risk    Difficulty of Paying Living Expenses: Not hard at all  Food Insecurity: No Food Insecurity   Worried About Charity fundraiser in the Last Year: Never true   Arboriculturist in the Last Year: Never true  Transportation Needs: No Transportation Needs   Lack of Transportation (Medical): No   Lack of Transportation (Non-Medical): No  Physical Activity: Inactive   Days of Exercise per Week: 0 days   Minutes of Exercise per Session: 0 min  Stress: No Stress Concern Present   Feeling of Stress : Not at all  Social Connections: Moderately Integrated   Frequency of Communication with Friends and Family: More than three times a week   Frequency of Social Gatherings with Friends and Family: Twice a week   Attends Religious Services: More than 4 times per year   Active Member of [employer] or Organizations: Yes   Attends Music therapist: More than 4 times per year   Marital Status: Divorced     Review of Systems   Gen: Denies fever, chills, anorexia. Denies fatigue, weakness, weight loss.  CV: Denies chest pain, palpitations, syncope, peripheral edema, and claudication. Resp: Denies dyspnea at rest, cough, wheezing, coughing up blood, and pleurisy. GI: see HPI Derm: Denies rash, itching, dry skin Psych: Denies depression, anxiety, memory loss, confusion. No homicidal or suicidal ideation.  Heme: Denies bruising, bleeding,  and enlarged lymph nodes.   Physical Exam   BP 134/72    Pulse 96    Temp (!) 97.4 F (36.3 C) (Temporal)    Ht $R'5\' 5"'hT$  (1.651 m)    Wt 203 lb 6.4 oz (92.3 kg)    BMI 33.85 kg/m   General: Alert and oriented. No distress noted. Pleasant and cooperative.  Head:  Normocephalic and atraumatic. Eyes:  Conjuctiva clear without scleral icterus. Mouth:  Oral mucosa pink and moist. Good dentition. No lesions. Lungs:  Clear to auscultation bilaterally. No wheezes, rales, or rhonchi. No distress.  Heart:  S1, S2 present without murmurs appreciated.  Abdomen:  +BS, soft, mild TTP to epigastric region , rounded. No rebound or guarding. No HSM or masses noted. Rectal: deferred Msk:  Symmetrical without gross deformities. Normal  posture. Extremities:  Without edema. Neurologic:  Alert and oriented x4. No asterixis.  Psych:  Alert and cooperative. Normal mood and affect.   Assessment  Randy Hebert is a 56 y.o. male with a history of anxiety, asthma, CAD, depression, diabetes, GERD, hyperlipidemia, HTN, history of MI, alcoholic cirrhosis, Barrett's presents today for follow-up.  He has a history of tubular adenomas, with tubular adenomas on most recent colonoscopy in February 2023.  Last EGD while inpatient with grade 1 varices without bleeding and portal gastropathy.  History of GI bleeding: EGD with grade 1 varices.  Hemoglobin 8.9 on hospital discharge, improved to 10.2.  Is taking iron daily.  Continues to deny any melena, hematemesis, or hematochezia.  CBC rechecked in July.  Cirrhosis: Due to EtOH abuse.  He has been abstinent since admission to hospital on January 2.  Last labs revealed alk phos 179, normal LFTs, hemoglobin 10.8, INR is remain normal. Was encephalopathic while inpatient and started on lactulose twice daily, was reduced at hospital follow-up, today due to gas.  He has no issues with encephalopathy at this time. RUQ U/S in February with 2 gallbladder polyps measuring up to 4.4 mm,  gallbladder wall thickening with areas of ringdown artifact most compatible with adenomyomatosis, and heterogeneous echogenic liver with nodular contour compatible with cirrhosis.  Doing well on lactulose 10 g daily and will continue this.  No AFP on file, will check baseline, pending results will recheck yearly. Repeat RUQ U/S, CBC, CMP, INR in July to keep with every 62-month interval.  In office follow-up at this time as.  If he has recurrent episodes of hepatic encephalopathy, will consider starting Xifaxan.  GERD/Barrett's: Doing well on Protonix 40 mg twice daily.   PLAN   RUQ [place] in July 1 week prior to follow up CBC, CMP, INR to be drawn 1 week prior to appointment We will also check AFP for baseline then yearly checks. Continue pantoprazole 40 mg BID. Continue lactulose 10g daily for goal of 2-3 bowel movements a day.  Follow-up in 4 months around July.   Venetia Night, MSN, FNP-BC, AGACNP-BC [facility]

## 2021-11-13 ENCOUNTER — Other Ambulatory Visit: Payer: Self-pay

## 2021-11-13 ENCOUNTER — Ambulatory Visit (INDEPENDENT_AMBULATORY_CARE_PROVIDER_SITE_OTHER): Payer: Medicare Other | Admitting: Family Medicine

## 2021-11-13 DIAGNOSIS — E1159 Type 2 diabetes mellitus with other circulatory complications: Secondary | ICD-10-CM | POA: Diagnosis not present

## 2021-11-13 DIAGNOSIS — L0291 Cutaneous abscess, unspecified: Secondary | ICD-10-CM | POA: Diagnosis not present

## 2021-11-13 DIAGNOSIS — F1010 Alcohol abuse, uncomplicated: Secondary | ICD-10-CM

## 2021-11-13 DIAGNOSIS — I10 Essential (primary) hypertension: Secondary | ICD-10-CM

## 2021-11-13 DIAGNOSIS — K7030 Alcoholic cirrhosis of liver without ascites: Secondary | ICD-10-CM

## 2021-11-13 DIAGNOSIS — F17200 Nicotine dependence, unspecified, uncomplicated: Secondary | ICD-10-CM | POA: Diagnosis not present

## 2021-11-13 DIAGNOSIS — Z8719 Personal history of other diseases of the digestive system: Secondary | ICD-10-CM | POA: Insufficient documentation

## 2021-11-13 MED ORDER — DOXYCYCLINE HYCLATE 100 MG PO TABS: 100.0000 mg | ORAL_TABLET | Freq: Two times a day (BID) | ORAL | 0 refills | Status: DC

## 2021-11-13 MED ORDER — MUPIROCIN 2 % EX OINT: 1.0000 "application " | TOPICAL_OINTMENT | Freq: Three times a day (TID) | CUTANEOUS | 0 refills | Status: AC

## 2021-11-13 NOTE — Assessment & Plan Note (Signed)
Advised smoking cessation. ?

## 2021-11-13 NOTE — Patient Instructions (Signed)
Warm compresses. ? ?Medication as directed. ? ?I am going to talk to GI about the lactulose. ? ?Follow up in 3 months. ? ?Take care ? ?Dr. Lacinda Axon  ?

## 2021-11-13 NOTE — Assessment & Plan Note (Signed)
BP stable.  Continue current medications: Amlodipine, Lasix. ?

## 2021-11-13 NOTE — Assessment & Plan Note (Signed)
Patient has quit drinking.  Advised continued cessation. ?

## 2021-11-13 NOTE — Assessment & Plan Note (Signed)
I have reached out to GI regarding his lactulose. ?

## 2021-11-13 NOTE — Progress Notes (Signed)
Subjective:  Patient ID: Randy Hebert, male    DOB: [birth year]  Age: 56 y.o. MRN: [id number]  CC: Chief Complaint  Patient presents with   Establish Care    Not taking lactulose or smoking cessation tx due to side effects    infected area on his L forearm     X 1 week     HPI:  56 year old male with an extensive past medical history including CHF, coronary artery disease, tobacco abuse, history of alcohol abuse and alcoholic cirrhosis, type 2 diabetes, hypertension, COPD, OSA, hyperlipidemia, recent GI bleed presents to establish care with me.  Patient reports that he attempted to see his primary care physician recently regarding a concern for infection of his left distal forearm/wrist.  He states that he was unable to be seen thus prompting him to desire a new PCP.   Patient reports he has an area of concern of the left distal forearm/proximal wrist.  He states that he has a raised and tender area which he believes is infected.  He has been cleaning the area without resolution.  States that he picked the eschar that developed and he believes that he has gotten infected.  Patient's hypertension is stable.  Patient has an upcoming appointment with pulmonology regarding COPD.  Continues to smoke.  Follows with cardiology regarding coronary artery disease and CHF.  He has hyperlipidemia, his last LDL was 74.  Patient follows with endocrinology regarding his diabetes.  Last A1c was 6.8.  He he is currently on Trulicity, Farxiga, metformin, and glipizide.  Patient follows closely with GI regarding cirrhosis and Barrett's esophagus.  Recent GI bleed.  Needs repeat labs in the near future.  Patient has stopped his lactulose.  He states that he believes it has elevated his blood sugar.  Will need to discuss with GI.  Patient Active Problem List   Diagnosis Date Noted   History of GI bleed 11/13/2021   Abscess 11/13/2021   History of colonic polyps 09/18/2021   Insomnia 09/13/2021    Obesity (BMI 30.0-34.9) 09/03/2021   Ulnar tunnel syndrome, left 07/24/2021   Iron deficiency anemia due to chronic blood loss 04/02/2021   Carpal tunnel syndrome 12/27/2019   Seasonal and perennial allergic rhinitis 11/10/2018   Cirrhosis of liver (HCC) 11/08/2018   Barrett's esophagus 08/04/2018   Vitamin D deficiency 06/30/2018   Alcohol abuse 06/30/2018   DM type 2 causing vascular disease (HCC) 08/17/2017   Chronic combined systolic and diastolic CHF (congestive heart failure) (HCC) 05/15/2017   OSA (obstructive sleep apnea) 04/28/2017   Depression with anxiety 04/15/2017   GERD (gastroesophageal reflux disease) 09/23/2016   COPD (chronic obstructive pulmonary disease) (HCC) 08/27/2015   Mixed hyperlipidemia 08/27/2015   Class 1 obesity due to excess calories without serious comorbidity with body mass index (BMI) of 34.0 to 34.9 in adult 08/27/2015   CAD (coronary artery disease) 05/31/2015   Current smoker 05/31/2015   Essential hypertension, benign 02/09/2015    Social Hx   Social History   Socioeconomic History   Marital status: Divorced    Spouse name: alice   Number of children: 3   Years of education: 12   Highest education level: Not on file  Occupational History   Occupation: SSI  Tobacco Use   Smoking status: Every Day    Packs/day: 0.50    Years: 46.00    Pack years: 23.00    Types: Cigarettes   Smokeless tobacco: Former    Types: Sports administrator  Vaping Use   Vaping Use: Former  Substance and Sexual Activity   Alcohol use: Not Currently    Comment: couple beers occ- stopped 09/2021   Drug use: No    Comment: history of drug use- marijuana, cocaine- quit 2014   Sexual activity: Yes    Partners: Female    Birth control/protection: None  Other Topics Concern   Not on file  Social History Narrative   Lives with girlfriend [name]   3 children, but 1 OD  In 2019.   Grandchildren-2      Two cats: may and princess; and a dog-foxy      Enjoy: spending time with  pets, TV, yard work       Diet: eats all food groups   Caffeine: half and half coffee, some tea-half and half decaf   Water: 6-8 cups daily      Wears seat belt   Does not use phone while driving   Psychologist, sport and exercise at home      Social Determinants of Health   Financial Resource Strain: Low Risk    Difficulty of Paying Living Expenses: Not hard at all  Food Insecurity: No Food Insecurity   Worried About Programme researcher, broadcasting/film/video in the Last Year: Never true   Barista in the Last Year: Never true  Transportation Needs: No Transportation Needs   Lack of Transportation (Medical): No   Lack of Transportation (Non-Medical): No  Physical Activity: Inactive   Days of Exercise per Week: 0 days   Minutes of Exercise per Session: 0 min  Stress: No Stress Concern Present   Feeling of Stress : Not at all  Social Connections: Moderately Integrated   Frequency of Communication with Friends and Family: More than three times a week   Frequency of Social Gatherings with Friends and Family: Twice a week   Attends Religious Services: More than 4 times per year   Active Member of [employer] or Organizations: Yes   Attends Engineer, structural: More than 4 times per year   Marital Status: Divorced    Review of Systems  Constitutional: Negative.   Respiratory:  Positive for shortness of breath.   Gastrointestinal:  Negative for blood in stool, constipation and vomiting.    Objective:  BP 130/78   Pulse 95   Temp 98.3 F (36.8 C)   Ht 5\' 5"  (1.651 m)   Wt 203 lb (92.1 kg)   SpO2 94%   BMI 33.78 kg/m   BP/Weight 11/13/2021 11/07/2021 10/23/2021  Systolic BP 130 134 124  Diastolic BP 78 72 72  Wt. (Lbs) 203 203.4 204.4  BMI 33.78 33.85 34.01    Physical Exam Constitutional:      General: He is not in acute distress.    Appearance: He is obese. He is not ill-appearing.  HENT:     Head: Normocephalic and atraumatic.  Eyes:     General:        Right eye: No discharge.         Left eye: No discharge.     Conjunctiva/sclera: Conjunctivae normal.  Cardiovascular:     Rate and Rhythm: Normal rate and regular rhythm.     Heart sounds: No murmur heard. Pulmonary:     Effort: Pulmonary effort is normal.     Breath sounds: Normal breath sounds. No wheezing or rales.  Abdominal:     Tenderness: There is no abdominal tenderness.     Comments: Protuberant abdomen.  Umbilical hernia noted.  Skin:    Comments: Left proximal forearm with a raised and indurated area.  No current drainage.  Neurological:     Mental Status: He is alert.  Psychiatric:        Mood and Affect: Mood normal.        Behavior: Behavior normal.    Lab Results  Component Value Date   WBC 9.5 09/13/2021   HGB 10.2 (L) 09/13/2021   HCT 30.3 (L) 09/13/2021   PLT 444 09/13/2021   GLUCOSE 162 (H) 09/13/2021   CHOL 124 04/11/2021   TRIG 118 04/11/2021   HDL 28 (L) 04/11/2021   LDLCALC 74 04/11/2021   ALT 27 09/13/2021   AST 29 09/13/2021   NA 139 09/13/2021   K 5.1 09/13/2021   CL 100 09/13/2021   CREATININE 0.74 (L) 09/13/2021   BUN 9 09/13/2021   CO2 24 09/13/2021   TSH 3.61 06/21/2020   PSA 0.23 06/21/2020   INR 1.0 09/06/2021   HGBA1C 6.8 (H) 09/03/2021   MICROALBUR 1.8 11/09/2019     Assessment & Plan:   Problem List Items Addressed This Visit       Cardiovascular and Mediastinum   Essential hypertension, benign    BP stable.  Continue current medications: Amlodipine, Lasix.      DM type 2 causing vascular disease (HCC)    A1c at goal.  Continue close follow-up with endocrinology.        Digestive   Cirrhosis of liver (HCC)    I have reached out to GI regarding his lactulose.        Other   Current smoker    Advised smoking cessation.      Alcohol abuse    Patient has quit drinking.  Advised continued cessation.      Abscess    Very indurated.  I am electing to treat him conservatively with warm compresses, topical Bactroban and oral doxycycline.        Meds ordered this encounter  Medications   doxycycline (VIBRA-TABS) 100 MG tablet    Sig: Take 1 tablet (100 mg total) by mouth 2 (two) times daily.    Dispense:  14 tablet    Refill:  0   mupirocin ointment (BACTROBAN) 2 %    Sig: Apply 1 application. topically 3 (three) times daily for 7 days.    Dispense:  30 g    Refill:  0    Follow-up:  Return in about 3 months (around 02/13/2022).  Everlene Other DO [facility]

## 2021-11-13 NOTE — Assessment & Plan Note (Signed)
A1c at goal.  Continue close follow-up with endocrinology. ?

## 2021-11-13 NOTE — Assessment & Plan Note (Signed)
Very indurated.  I am electing to treat him conservatively with warm compresses, topical Bactroban and oral doxycycline. ?

## 2021-11-21 ENCOUNTER — Encounter: Payer: Self-pay | Admitting: Internal Medicine

## 2021-11-21 ENCOUNTER — Ambulatory Visit (INDEPENDENT_AMBULATORY_CARE_PROVIDER_SITE_OTHER): Payer: Medicare Other | Admitting: Internal Medicine

## 2021-11-21 ENCOUNTER — Other Ambulatory Visit: Payer: Self-pay

## 2021-11-21 VITALS — BP 138/84 | HR 85 | Resp 18 | Ht 65.0 in | Wt 206.0 lb

## 2021-11-21 DIAGNOSIS — I10 Essential (primary) hypertension: Secondary | ICD-10-CM

## 2021-11-21 DIAGNOSIS — D500 Iron deficiency anemia secondary to blood loss (chronic): Secondary | ICD-10-CM | POA: Diagnosis not present

## 2021-11-21 DIAGNOSIS — E1159 Type 2 diabetes mellitus with other circulatory complications: Secondary | ICD-10-CM

## 2021-11-21 DIAGNOSIS — I5042 Chronic combined systolic (congestive) and diastolic (congestive) heart failure: Secondary | ICD-10-CM

## 2021-11-21 DIAGNOSIS — I25118 Atherosclerotic heart disease of native coronary artery with other forms of angina pectoris: Secondary | ICD-10-CM

## 2021-11-21 DIAGNOSIS — E782 Mixed hyperlipidemia: Secondary | ICD-10-CM

## 2021-11-21 DIAGNOSIS — L84 Corns and callosities: Secondary | ICD-10-CM | POA: Diagnosis not present

## 2021-11-21 DIAGNOSIS — L739 Follicular disorder, unspecified: Secondary | ICD-10-CM | POA: Diagnosis not present

## 2021-11-21 DIAGNOSIS — D229 Melanocytic nevi, unspecified: Secondary | ICD-10-CM | POA: Diagnosis not present

## 2021-11-21 DIAGNOSIS — J449 Chronic obstructive pulmonary disease, unspecified: Secondary | ICD-10-CM | POA: Diagnosis not present

## 2021-11-21 NOTE — Assessment & Plan Note (Signed)
Followed by Dr. [name] ?On Lasix and Farxiga ?Appears euvolemic currently ?

## 2021-11-21 NOTE — Patient Instructions (Addendum)
Please continue taking medications as prescribed. ? ?Please continue to follow low carb diet and ambulate as tolerated. ? ?Please try to cut down -> quit smoking. ? ?Please get fasting blood tests done before the next visit. ?

## 2021-11-21 NOTE — Progress Notes (Signed)
? ?Established Patient Office Visit ? ?Subjective:  ?Patient ID: Randy Hebert, male    DOB: 1966/03/14  Age: 56 y.o. MRN: [id number] ? ?CC:  ?Chief Complaint  ?Patient presents with  ? Follow-up  ?  4 month follow up CAD DM AND COPD  ? ? ?HPI ?Randy Hebert is a 56 y.o. male with past medical history of CAD s/p stent placement, HFrEF, type II DM with HLD, COPD, OSA, GERD, depression with anxiety and tobacco abuse who presents for f/u of his chronic medical conditions. ? ?HTN and CAD: BP is well-controlled. Takes medications regularly. Patient denies headache, dizziness, chest pain, dyspnea or palpitations. ? ?Type II DM with HLD: He follows up with Dr. Dorris Fetch for it.  Denies any polyuria or polydipsia currently. ? ?COPD: He uses Spiriva and as needed albuterol for COPD.  He has been trying to cut down smoking, currently smokes about half pack a day.  Currently denies any wheezing, but has chronic dyspnea.  He has home O2 for dyspnea. ? ?He reports having a skin lesion over left forearm, for which he was given doxycycline and mupirocin ointment by a different provider.  His skin lesion has started improving now, denies any discharge from the area.  Pain has also improved now.  Denies any fever or chills currently. ? ? ? ? ? ? ?Past Medical History:  ?Diagnosis Date  ? ACE inhibitor-aggravated angioedema   ? Allergy   ? Angio-edema   ? Anxiety   ? Arthritis   ? Asthma   ? hip replacement  ? Back pain   ? Bradycardia 04/28/2017  ? Bulging of cervical intervertebral disc   ? CAD (coronary artery disease)   ? lateral STEMI 02/06/2015 00% D1 occlusion treated with Promus Premier 2.5 mm x 16 mm DES, 70% ramus stenosis, 40% mid RCA stenosis, 45% distal RCA stenosis, EF 45-50%  ? CHF (congestive heart failure) ([place])   ? COPD (chronic obstructive pulmonary disease) ([place])   ? Depression   ? Diabetes mellitus without complication ([place])   ? Difficult intubation   ? 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  ? Dry eye    ? Dyspnea   ? Early satiety 09/23/2016  ? GERD (gastroesophageal reflux disease)   ? HCAP (healthcare-associated pneumonia) 05/15/2017  ? Headache   ? Heart murmur   ? Hip pain   ? Hyperlipidemia   ? Hypertension   ? Lobar pneumonia ([place]) 05/15/2017  ? Melena 08/04/2018  ? MI (myocardial infarction) ([place])   ? Myocardial infarction [facility])   ? Neck pain   ? Non-ST elevation (NSTEMI) myocardial infarction ([place]) 04/27/2017  ? NSTEMI (non-ST elevated myocardial infarction) ([place]) 04/26/2017  ? Otitis media   ? Pleurisy   ? Pneumonia due to COVID-19 virus 10/07/2019  ? Rectal bleeding 11/08/2018  ? Right shoulder pain 03/20/2020  ? Sinus pause   ? 9 sec sinus pause on telemetry after started on coreg after MI, avoid AV nodal blocking agent  ? Sleep apnea   ? 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 STEMI (ST elevation myocardial infarction) ([place]) 05/31/2015  ? Substance abuse ([place])   ? alcoholic  ? Syncope 06/29/2017  ? Syncope and collapse 05/15/2017  ? Transaminitis 08/04/2018  ? Unilateral primary osteoarthritis, right hip 07/01/2016  ? ? ?Past Surgical History:  ?Procedure Laterality Date  ? BIOPSY  10/09/2016  ? Procedure: BIOPSY;  Surgeon: Daneil Dolin, MD;  [facility];  Service: Endoscopy;;  ? BIOPSY  11/28/2019  ? Procedure: BIOPSY;  Surgeon: Daneil Dolin, MD;  [facility];  Service: Endoscopy;;  ? CARDIAC CATHETERIZATION N/A 02/06/2015  ? Procedure: Left Heart Cath and Coronary Angiography;  Surgeon: Leonie Man, MD;  Location: [facility];  Service: Cardiovascular;  Laterality: N/A;  ? CARDIAC CATHETERIZATION N/A 02/06/2015  ? Procedure: Coronary Stent Intervention;  Surgeon: Leonie Man, MD;  Location: [place] [facility];  Service: Cardiovascular;  Laterality: N/A;  ? COLONOSCOPY WITH PROPOFOL N/A 10/09/2016  ? Sigmoid and descending colon diverticulosis, four 4-6 mm polyps in sigmoid, one 4 mm polyp in descending. Tubular adenomas and hyperplastic. 5 year  surveillance.   ? COLONOSCOPY WITH PROPOFOL N/A 11/24/2016  ? Sigmoid and descending colon diverticulosis, four 4-6 mm polyps in sigmoid, one 4 mm polyp in descending. Tubular adenomas and hyperplastic. 5 year surveillance.   ? COLONOSCOPY WITH PROPOFOL N/A 10/18/2021  ? [place]: nonbleeding internal hemorrhoids, small and large mouth diverticula found in the sigmoid, descending, transverse colon.  A 9 mm sessile polyp was found in the ascending colon that was removed.  4 sessile polyps found in the sigmoid, descending, transverse colon 3 to 5 mm in size, examining otherwise.  Path revealed tubular adenomas.  Repeat due in 3 to 5 years for surveillance.  ? CORONARY ANGIOPLASTY WITH STENT PLACEMENT  01/2015  ? CORONARY STENT INTERVENTION N/A 04/27/2017  ? Procedure: CORONARY STENT INTERVENTION;  Surgeon: Nelva Bush, MD;  Location: [place] [facility];  Service: Cardiovascular;  Laterality: N/A;  ? ELECTROPHYSIOLOGY STUDY N/A 06/29/2017  ? Procedure: ELECTROPHYSIOLOGY STUDY;  Surgeon: Evans Lance, MD;  Location: [place] [facility];  Service: Cardiovascular;  Laterality: N/A;  ? ESOPHAGOGASTRODUODENOSCOPY (EGD) WITH PROPOFOL N/A 10/09/2016  ? Dr. Gala Romney: LA grade a esophagitis.  Barrett's esophagus, biopsy-proven.  Small hiatal hernia.  EGD February 2019  ? ESOPHAGOGASTRODUODENOSCOPY (EGD) WITH PROPOFOL N/A 11/28/2019  ?  salmon-colored esophageal mucosa (Barrett's) small hiatal hernia, portal hypertensive gastropathy, normal duodenum, 3 year surveillance  ? ESOPHAGOGASTRODUODENOSCOPY (EGD) WITH PROPOFOL N/A 09/06/2021  ? three columns of grade 1 varices in distal esophagus, no stigmata of bleeding or red wale signs. Small hiatal hernia. Mild portal gastropathy. Normal duodenum. Repeat upper endoscopy in 1 year for surveillance.  ? INCISION / DRAINAGE HAND / FINGER    ? LEFT HEART CATH AND CORONARY ANGIOGRAPHY N/A 04/27/2017  ? Procedure: LEFT HEART CATH AND CORONARY ANGIOGRAPHY;  Surgeon: Nelva Bush, MD;   Location: [place] [facility];  Service: Cardiovascular;  Laterality: N/A;  ? LOOP RECORDER INSERTION  06/29/2017  ? Procedure: Loop Recorder Insertion;  Surgeon: Evans Lance, MD;  Location: [place] [facility];  Service: Cardiovascular;;  ? POLYPECTOMY  11/24/2016  ? Procedure: POLYPECTOMY;  Surgeon: Daneil Dolin, MD;  [facility];  Service: Endoscopy;;  descending and sigmoid  ? POLYPECTOMY  10/18/2021  ? Procedure: POLYPECTOMY;  Surgeon: Eloise Harman, DO;  [facility];  Service: Endoscopy;;  ? TOTAL HIP ARTHROPLASTY Right 07/01/2016  ? TOTAL HIP ARTHROPLASTY Right 07/01/2016  ? Procedure: RIGHT TOTAL HIP ARTHROPLASTY ANTERIOR APPROACH;  Surgeon: Mcarthur Rossetti, MD;  Location: [facility];  Service: Orthopedics;  Laterality: Right;  ? ? ?Family History  ?Problem Relation Age of Onset  ? Heart attack Father   ? Stroke Father   ? Arthritis Father   ? Heart disease Father   ? Cancer Mother   ?     ???  ?  Arthritis Mother   ? Heart disease Brother 52  ?     died in sleep  ? Early death Brother   ? Diabetes Maternal Uncle   ? Alzheimer's disease Maternal Grandmother   ? ? ?Social History  ? ?Socioeconomic History  ? Marital status: Divorced  ?  Spouse name: alice  ? Number of children: 3  ? Years of education: 24  ? Highest education level: Not on file  ?Occupational History  ? Occupation: SSI  ?Tobacco Use  ? Smoking status: Every Day  ?  Packs/day: 0.50  ?  Years: 46.00  ?  Pack years: 23.00  ?  Types: Cigarettes  ? Smokeless tobacco: Former  ?  Types: Chew  ?Vaping Use  ? Vaping Use: Former  ?Substance and Sexual Activity  ? Alcohol use: Not Currently  ?  Comment: couple beers occ- stopped 09/2021  ? Drug use: No  ?  Comment: history of drug use- marijuana, cocaine- quit 2014  ? Sexual activity: Yes  ?  Partners: Female  ?  Birth control/protection: None  ?Other Topics Concern  ? Not on file  ?Social History Narrative  ? Lives with girlfriend [name]  ? 3 children, but 1 OD  In 2019.   ? Grandchildren-2  ?   ? Two cats: may and princess; and a dog-foxy  ?   ? Enjoy: spending time with pets, TV, yard work   ?   ? Diet: eats all food groups  ? Caffeine: half and half coffee, some tea-half a

## 2021-11-21 NOTE — Assessment & Plan Note (Signed)
Of left thigh, unchanged in size and color ?Offered Dermatology referral, prefers to wait for now ?

## 2021-11-21 NOTE — Assessment & Plan Note (Signed)
Lab Results  ?Component Value Date  ? HGBA1C 6.8 (H) 09/03/2021  ? ? ?On metformin, glipizide, Trulicity and [place] ?Followed by Dr. Dorris Fetch ?Advised to follow diabetic diet ?On statin ?F/u CMP and lipid panel in the next visit ?Diabetic foot exam: Today ?Diabetic eye exam: Advised to follow up with Ophthalmology for diabetic eye exam ?

## 2021-11-21 NOTE — Assessment & Plan Note (Signed)
Has had chronic GI bleeding in the past ?Takes iron supplement currently ?Check CBC ?

## 2021-11-21 NOTE — Assessment & Plan Note (Signed)
On statin Check lipid profile 

## 2021-11-21 NOTE — Assessment & Plan Note (Signed)
B/l, advised to use Compound W ?

## 2021-11-21 NOTE — Assessment & Plan Note (Signed)
BP Readings from Last 1 Encounters:  ?11/21/21 138/84  ? ?Well-controlled ?Counseled for compliance with the medications ?Advised DASH diet and moderate exercise/walking, at least 150 mins/week ? ?

## 2021-11-21 NOTE — Assessment & Plan Note (Signed)
Of left forearm, improved with doxycycline and mupirocin ointment ?

## 2021-11-21 NOTE — Assessment & Plan Note (Signed)
S/p stent placement ?Followed by Dr. [name] ?On aspirin and statin ?Has anginal pain at times, takes nitroglycerin ?

## 2021-11-29 ENCOUNTER — Telehealth: Payer: Self-pay | Admitting: *Deleted

## 2021-11-29 NOTE — Chronic Care Management (AMB) (Signed)
?  Care Management  ? ?Note ? ?11/29/2021 ?Name: Randy Hebert MRN: [id number] DOB: Jul 08, 1966 ? ?Randy Hebert is a 56 y.o. year old male who is a primary care patient of Lindell Spar, MD and is actively engaged with the care management team. I reached out to [name] by phone today to assist with scheduling an initial visit with the RN Case Manager ? ?Follow up plan: ?Unsuccessful telephone outreach attempt made. A HIPAA compliant phone message was left for the patient providing contact information and requesting a return call.  ?The care management team will reach out to the patient again over the next 7 days.  ?If patient returns call to provider office, please advise to call [facility]  at [phone]. ? ?[name]  ?Care Guide, Embedded Care Coordination ?[facility]  ?Direct Dial: [phone] ? ?

## 2021-12-02 NOTE — Progress Notes (Signed)
? ?Randy Hebert, male    DOB: Nov 17, 1965,   MRN: [id number] ? ? ?Brief patient profile:  ?56  yowm  active smoker referred to pulmonary clinic in [place]  12/03/2021 by Dr Posey Pronto for ? 02 dep copd   ? ?Admit date: 09/02/2021 ?Discharge date: 09/06/2021 ?  ?Discharge disposition: Home ?  ?  ?Recommendations for Outpatient Follow-Up:  ?  ?Follow-up With PCP in 1 week ? ?Follow-up with gastroenterologist within 1 month of discharge to arrange for colonoscopy and surveillance EGD. ?  ?Discharge Diagnosis:  ?  ?Principal Problem: ?  Upper GI bleed ?  Essential hypertension ?  Chronic systolic CHF (congestive heart failure) ([place]) ?  COPD (chronic obstructive pulmonary disease) ([place]) ?  Mixed hyperlipidemia ?  OSA (obstructive sleep apnea) ?  Hyperglycemia due to diabetes mellitus ([place]) ?  Acute respiratory failure with hypoxia ([place]) ?  Diarrhea ?  Cirrhosis of liver ([place]) ?  Influenza A ?  Hypoalbuminemia due to protein-calorie malnutrition ([place]) ?  Lactic acidosis ?  Elevated troponin ?  Obesity (BMI 30.0-34.9) ?  Hyperkalemia ?  Hyperammonemia ([place]) ?  ?  ?   ?  ?  ?  ?Hospital Course:  ?  ?Randy Hebert is a 56 y.o. male with medical history significant for essential hypertension, T2DM, CAD, STEMI (2016), NSTEMI (2018), CHF, hyperlipidemia, COPD, liver cirrhosis, who presented to the hospital with pleuritic chest pain, cough, fever, nausea, vomiting, diarrhea, hematemesis and bloody stools. ?  ?He was found to have acute hypoxemic respiratory failure, influenza A infection and acute GI bleeding.  He was treated with Tamiflu, oxygen via nasal cannula, IV Protonix drip, IV octreotide drip and empiric IV antibiotics.  He had acute blood loss anemia but he did not require transfusion.  He also had hypokalemia and hypomagnesemia that improved with treatment.  He was treated with lactulose because of elevated ammonia levels. ?  ?He underwent EGD which showed grade 1 esophageal varices, small hiatal hernia, portal  hypertensive gastropathy and normal examined duodenum.  He could not be weaned off of oxygen.  However, he said he felt better and insisted on being discharged home.  He was discharged on 4 L/min oxygen via nasal cannula for chronic hypoxemic respiratory failure likely from his COPD.  He has been advised to avoid alcohol and cigarette smoking. ?  ? ? ?History of Present Illness  ?12/03/2021  Pulmonary/ 1st office eval/ Melvyn Novas / [place] Office off 02 for a week prior to OV  and on cpap per [place] / symb  ?Chief Complaint  ?Patient presents with  ? Consult  ?  Patient has hx of COPD and shortness of breath with exertion. Productive cough with white/yellow sputum. Patient wears CPAP. Has not worn oxygen in about a week, only slept with it.   ?Dyspnea:  MMRC1 = can walk nl pace, flat grade, can't hurry or go uphills or steps s sob   ?Cough: ? Worse p eating  ?Sleep: cpap flat bed 2 pillows =baseline  ?SABA use: neb three times a week / hfa p ex only ? ?No obvious day to day or daytime variability or assoc excess/ purulent sputum or mucus plugs or hemoptysis or cp or chest tightness, subjective wheeze or overt sinus or hb symptoms.  ? ?Sleeping as above  without nocturnal  or early am exacerbation  of respiratory  c/o's or need for noct saba. Also denies any obvious fluctuation of symptoms with weather or environmental changes or other aggravating  or alleviating factors except as outlined above  ? ?No unusual exposure hx or h/o childhood pna/ asthma or knowledge of premature birth. ? ?Current Allergies, Complete Past Medical History, Past Surgical History, Family History, and Social History were reviewed in [employer] record. ? ?ROS  The following are not active complaints unless bolded ?Hoarseness, sore throat, dysphagia, dental problems, itching, sneezing,  nasal congestion or discharge of excess mucus or purulent secretions, ear ache,   fever, chills, sweats, unintended wt loss or wt gain,  classically pleuritic or exertional cp,  orthopnea pnd or arm/hand swelling  or leg swelling, presyncope, palpitations, abdominal pain, anorexia, nausea, vomiting, diarrhea  or change in bowel habits or change in bladder habits, change in stools or change in urine, dysuria, hematuria,  rash, arthralgias, visual complaints, headache, numbness, weakness or ataxia or problems with walking or coordination,  change in mood or  memory. ?      ?   ? ? ?Past Medical History:  ?Diagnosis Date  ? ACE inhibitor-aggravated angioedema   ? Allergy   ? Angio-edema   ? Anxiety   ? Arthritis   ? Asthma   ? hip replacement  ? Back pain   ? Bradycardia 04/28/2017  ? Bulging of cervical intervertebral disc   ? CAD (coronary artery disease)   ? lateral STEMI 02/06/2015 00% D1 occlusion treated with Promus Premier 2.5 mm x 16 mm DES, 70% ramus stenosis, 40% mid RCA stenosis, 45% distal RCA stenosis, EF 45-50%  ? CHF (congestive heart failure) ([place])   ? COPD (chronic obstructive pulmonary disease) ([place])   ? Depression   ? Diabetes mellitus without complication ([place])   ? Difficult intubation   ? 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  ? Dry eye   ? Dyspnea   ? Early satiety 09/23/2016  ? GERD (gastroesophageal reflux disease)   ? HCAP (healthcare-associated pneumonia) 05/15/2017  ? Headache   ? Heart murmur   ? Hip pain   ? Hyperlipidemia   ? Hypertension   ? Lobar pneumonia ([place]) 05/15/2017  ? Melena 08/04/2018  ? MI (myocardial infarction) ([place])   ? Myocardial infarction [facility])   ? Neck pain   ? Non-ST elevation (NSTEMI) myocardial infarction ([place]) 04/27/2017  ? NSTEMI (non-ST elevated myocardial infarction) ([place]) 04/26/2017  ? Otitis media   ? Pleurisy   ? Pneumonia due to COVID-19 virus 10/07/2019  ? Rectal bleeding 11/08/2018  ? Right shoulder pain 03/20/2020  ? Sinus pause   ? 9 sec sinus pause on telemetry after started on coreg after MI, avoid AV nodal blocking agent  ? Sleep apnea   ? 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/01/2016  ? STEMI (ST elevation myocardial infarction) ([place]) 05/31/2015  ? Substance abuse ([place])   ? alcoholic  ? Syncope 06/29/2017  ? Syncope and collapse 05/15/2017  ? Transaminitis 08/04/2018  ? Unilateral primary osteoarthritis, right hip 07/01/2016  ? ? ?Outpatient Medications Prior to Visit  ?Medication Sig Dispense Refill  ? acetaminophen (TYLENOL) 500 MG tablet Take 2 tablets (1,000 mg total) by mouth every 8 (eight) hours as needed for mild pain or fever.    ? albuterol (PROVENTIL) (2.5 MG/3ML) 0.083% nebulizer solution Take 3 mLs (2.5 mg total) by nebulization every 6 (six) hours as needed for wheezing or shortness of breath. 150 mL 1  ? albuterol (VENTOLIN HFA) 108 (90 Base) MCG/ACT inhaler INHALE 2 PUFFS INTO THE LUNGS EVERY 6 HOURS AS NEEDEDFOR SHORTNESS  OF BREATH OR WHEEZING. 18 g 1  ? amLODipine (NORVASC) 10 MG tablet Take 1 tablet (10 mg total) by mouth daily. 90 tablet 0  ? ascorbic acid (VITAMIN C) 500 MG tablet Take 1 tablet (500 mg total) by mouth daily. 30 tablet 1  ? aspirin 81 MG EC tablet Take 1 tablet (81 mg total) by mouth daily. (Patient taking differently: Take 81 mg by mouth in the morning and at bedtime.) 30 tablet   ? atorvastatin (LIPITOR) 40 MG tablet TAKE 1 TABLET BY MOUTH ONCE A DAY. (Patient taking differently: Take 40 mg by mouth at bedtime.) 90 tablet 1  ? Azelastine HCl 137 MCG/SPRAY SOLN USE 2 SPRAYS IN EACH NOSTRIL TWICE DAILY. 30 mL 0  ? budesonide-formoterol (SYMBICORT) 160-4.5 MCG/ACT inhaler Inhale 2 puffs into the lungs 2 (two) times daily.    ? Cholecalciferol (VITAMIN D3) 125 MCG (5000 UT) CAPS Take 1 capsule (5,000 Units total) by mouth daily. 90 capsule 0  ? citalopram (CELEXA) 20 MG tablet Take 20 mg by mouth daily.    ? cycloSPORINE (RESTASIS) 0.05 % ophthalmic emulsion Place 1 drop into both eyes 2 (two) times daily.    ? doxycycline (VIBRA-TABS) 100 MG tablet Take 1 tablet (100 mg total) by mouth 2 (two) times daily. 14 tablet 0  ? FARXIGA 10 MG TABS tablet TAKE  ONE TABLET BY MOUTH ONCE DAILY. 30 tablet 11  ? ferrous sulfate 325 (65 FE) MG tablet Take 1 tablet (325 mg total) by mouth daily with breakfast. 90 tablet 1  ? furosemide (LASIX) 40 MG tablet Take 1 tablet (40 mg t

## 2021-12-03 ENCOUNTER — Ambulatory Visit (INDEPENDENT_AMBULATORY_CARE_PROVIDER_SITE_OTHER): Payer: Medicare Other | Admitting: Internal Medicine

## 2021-12-03 ENCOUNTER — Encounter: Payer: Self-pay | Admitting: Internal Medicine

## 2021-12-03 DIAGNOSIS — J449 Chronic obstructive pulmonary disease, unspecified: Secondary | ICD-10-CM | POA: Diagnosis not present

## 2021-12-03 DIAGNOSIS — F17210 Nicotine dependence, cigarettes, uncomplicated: Secondary | ICD-10-CM | POA: Diagnosis not present

## 2021-12-03 DIAGNOSIS — G4733 Obstructive sleep apnea (adult) (pediatric): Secondary | ICD-10-CM

## 2021-12-03 DIAGNOSIS — J4489 Other specified chronic obstructive pulmonary disease: Secondary | ICD-10-CM

## 2021-12-03 MED ORDER — BREZTRI AEROSPHERE 160-9-4.8 MCG/ACT IN AERO: 2.0000 | INHALATION_SPRAY | Freq: Two times a day (BID) | RESPIRATORY_TRACT | 0 refills | Status: DC

## 2021-12-03 NOTE — Patient Instructions (Addendum)
The key is to stop smoking completely before smoking completely stops you! ? ?You do have copd but do have issues with your weight and asthma ? ?Plan A = Automatic = Always=    Symbicort 160 is 2 every 12 hours until 100% then up to 2 puffs every 12 hours  ? ?Work on inhaler technique:  relax and gently blow all the way out then take a nice smooth full deep breath back in, triggering the inhaler at same time you start breathing in.  Hold for up to 5 seconds if you can. Blow out thru nose. Rinse and gargle with water when done.  If mouth or throat bother you at all,  try brushing teeth/gums/tongue with arm and hammer toothpaste/ make a slurry and gargle and spit out.  ? ?   ? ?Plan B = Backup (to supplement plan A, not to replace it) ?Only use your albuterol inhaler as a rescue medication to be used if you can't catch your breath by resting or doing a relaxed purse lip breathing pattern.  ?- The less you use it, the better it will work when you need it. ?- Ok to use the inhaler up to 2 puffs  every 4 hours if you must but call for appointment if use goes up over your usual need ?- Don't leave home without it !!  (think of it like starter fluid/ spare tire for your car)  ? ?Plan C = Crisis (instead of Plan B but only if Plan B stops working) ?- only use your albuterol nebulizer if you first try Plan B and it fails to help > ok to use the nebulizer up to every 4 hours but if start needing it regularly call for immediate appointment ? ? ?Plan D = Doctor ?- call me if B and C not adequate ? ?Ok to stop all 0xygen ? ? ?Please schedule a follow up visit in 3 months but call sooner if needed  with all medications /inhalers/ solutions in hand so we can verify exactly what you are taking. This includes all medications from all doctors and over the counters  ?  ? ? ? ? ? ? ?  ?   ?

## 2021-12-03 NOTE — Assessment & Plan Note (Signed)
rx per Dr [name]  ? ?Etiology and pathophysiology of osa including relationship to obesity reviewed in detail   ?

## 2021-12-03 NOTE — Assessment & Plan Note (Addendum)
Counseled re importance of smoking cessation but did not meet time criteria for separate billing   ? ?>>> rec continue lung cancer screening as planned  ? ?Each maintenance medication was reviewed in detail including emphasizing most importantly the difference between maintenance and prns and under what circumstances the prns are to be triggered using an action plan format where appropriate. ? ?Total time for H and P, chart review, counseling, ABCDE action plan/ reviewing hfa/neb device(s) , directly observing portions of ambulatory 02 saturation study/ and generating customized AVS unique to this office visit / same day charting > 45 min with pt new to me ?     ?  ?      ?

## 2021-12-03 NOTE — Assessment & Plan Note (Signed)
Active smoker ?- PFTs 07/13/15 nl x for ERV 18% at wt 220 ?- 12/03/2021   Walked on RA  x  3  lap(s) =  approx 450  ft  @ mod fast pace, stopped due to end of study, min sob with lowest 02 sats 92%  ?- 12/03/2021  After extensive coaching inhaler device,  effectiveness =    75% (short Ti) > continue symbicort 160 up to 2 q 12 h prn  ? ?This is not copd but rather AB combined with restrictive effects of obesity so rx as mild asthma with prn symbicort  Based on two studies from [place]; 20 p 1865 (2018) and 380 : p2020-30 (2019) in pts with mild asthma it is reasonable to use symbicort   2bid "prn" flare in this setting but I emphasized this was only shown with symbicort and takes advantage of the rapid onset of action but is not the same as "rescue therapy" but can be stopped once the acute symptoms have resolved and the need for rescue has been minimized (< 2 x weekly)   ? ?If stops smoking may not need symbicort at all  ? ?F/u q 6 m, sooner prn  ?

## 2021-12-05 ENCOUNTER — Other Ambulatory Visit: Payer: Self-pay | Admitting: "Endocrinology

## 2021-12-05 NOTE — Chronic Care Management (AMB) (Signed)
?  Care Management  ? ?Note ? ?12/05/2021 ?Name: Randy Hebert MRN: [id number] DOB: [birth year] ? ?Randy Hebert is a 56 y.o. year old male who is a primary care patient of Lindell Spar, MD and is actively engaged with the care management team. I reached out to [name] by phone today to assist with scheduling an initial visit with the RN Case Manager ? ?Follow up plan: ?Telephone appointment with care management team member scheduled for:01/23/22 ? ?[name]  ?Care Guide, Embedded Care Coordination ?[facility]  Care Management  ?Direct Dial: [phone] ? ?

## 2021-12-17 ENCOUNTER — Telehealth: Payer: Medicare Other

## 2021-12-19 ENCOUNTER — Ambulatory Visit (INDEPENDENT_AMBULATORY_CARE_PROVIDER_SITE_OTHER): Payer: Medicare Other | Admitting: Internal Medicine

## 2021-12-19 ENCOUNTER — Encounter: Payer: Self-pay | Admitting: Internal Medicine

## 2021-12-19 VITALS — BP 124/60 | HR 81 | Ht 65.0 in | Wt 204.4 lb

## 2021-12-19 DIAGNOSIS — R55 Syncope and collapse: Secondary | ICD-10-CM | POA: Diagnosis not present

## 2021-12-19 DIAGNOSIS — I2510 Atherosclerotic heart disease of native coronary artery without angina pectoris: Secondary | ICD-10-CM | POA: Diagnosis not present

## 2021-12-19 NOTE — Progress Notes (Signed)
? ? ? ? ?HPI ?Mr. Desa returns today for followup. He is a pleasant morbidly obese man with a h/o syncope, s/p ILR insertion. He has had some dizzy spells but was taking too much diuretic. He has stopped drinking and using cocaine. His son overdosed. He admits to some dietary indiscretion. He has not had syncope. He has started heating with wood and is cutting wood to burn in his stove. He states that the stress in his life is improved since he started to stay by himself. ?Allergies  ?Allergen Reactions  ? Carvedilol Other (See Comments)  ?  Sinus pause on telemetry >3 seconds. Longest one 9 sec. No AV nodal agent  ? Lisinopril Anaphylaxis, Shortness Of Breath and Swelling  ?  Angioedema, required intubation and mechanical ventilation  ? Chantix [Varenicline] Other (See Comments)  ?  Nightmares and unable to sleep well  ? Amoxicillin Nausea Only  ?  Did it involve swelling of the face/tongue/throat, SOB, or low BP? No ?Did it involve sudden or severe rash/hives, skin peeling, or any reaction on the inside of your mouth or nose? No ?Did you need to seek medical attention at a hospital or doctor's office? No ?When did it last happen? childhood reaction      ?If all above answers are "NO", may proceed with cephalosporin use. ?  ? ? ? ?Current Outpatient Medications  ?Medication Sig Dispense Refill  ? acetaminophen (TYLENOL) 500 MG tablet Take 2 tablets (1,000 mg total) by mouth every 8 (eight) hours as needed for mild pain or fever.    ? albuterol (PROVENTIL) (2.5 MG/3ML) 0.083% nebulizer solution Take 3 mLs (2.5 mg total) by nebulization every 6 (six) hours as needed for wheezing or shortness of breath. 150 mL 1  ? albuterol (VENTOLIN HFA) 108 (90 Base) MCG/ACT inhaler INHALE 2 PUFFS INTO THE LUNGS EVERY 6 HOURS AS NEEDEDFOR SHORTNESS OF BREATH OR WHEEZING. 18 g 1  ? amLODipine (NORVASC) 10 MG tablet Take 1 tablet (10 mg total) by mouth daily. 90 tablet 0  ? ascorbic acid (VITAMIN C) 500 MG tablet Take 1 tablet  (500 mg total) by mouth daily. 30 tablet 1  ? aspirin 81 MG EC tablet Take 1 tablet (81 mg total) by mouth daily. (Patient taking differently: Take 81 mg by mouth in the morning and at bedtime.) 30 tablet   ? atorvastatin (LIPITOR) 40 MG tablet TAKE 1 TABLET BY MOUTH ONCE A DAY. (Patient taking differently: Take 40 mg by mouth at bedtime.) 90 tablet 1  ? Azelastine HCl 137 MCG/SPRAY SOLN USE 2 SPRAYS IN EACH NOSTRIL TWICE DAILY. 30 mL 0  ? Budeson-Glycopyrrol-Formoterol (BREZTRI AEROSPHERE) 160-9-4.8 MCG/ACT AERO Inhale 2 puffs into the lungs 2 (two) times daily. 10.7 g 0  ? budesonide-formoterol (SYMBICORT) 160-4.5 MCG/ACT inhaler Inhale 2 puffs into the lungs 2 (two) times daily.    ? Cholecalciferol (VITAMIN D3) 125 MCG (5000 UT) CAPS Take 1 capsule (5,000 Units total) by mouth daily. 90 capsule 0  ? citalopram (CELEXA) 20 MG tablet Take 20 mg by mouth daily.    ? cycloSPORINE (RESTASIS) 0.05 % ophthalmic emulsion Place 1 drop into both eyes 2 (two) times daily.    ? doxycycline (VIBRA-TABS) 100 MG tablet Take 1 tablet (100 mg total) by mouth 2 (two) times daily. 14 tablet 0  ? FARXIGA 10 MG TABS tablet TAKE ONE TABLET BY MOUTH ONCE DAILY. 30 tablet 11  ? ferrous sulfate 325 (65 FE) MG tablet Take 1 tablet (325  mg total) by mouth daily with breakfast. 90 tablet 1  ? furosemide (LASIX) 40 MG tablet Take 1 tablet (40 mg total) by mouth daily as needed for fluid. 30 tablet 11  ? glipiZIDE (GLUCOTROL XL) 5 MG 24 hr tablet TAKE 1 TABLET BY MOUTH DAILY WITH BREAKFAST. 90 tablet 0  ? glucose blood (ACCU-CHEK AVIVA PLUS) test strip Use as instructed to check blood glucose three times daily. 100 each 2  ? lactulose (CHRONULAC) 10 GM/15ML solution Take 15 mLs (10 g total) by mouth daily. 236 mL 1  ? magnesium gluconate (MAGONATE) 500 MG tablet Take 500 mg by mouth 2 (two) times daily.    ? Melatonin 10 MG CAPS Take 10 mg by mouth at bedtime.    ? metFORMIN (GLUCOPHAGE) 500 MG tablet Take 1 tablet (500 mg total) by mouth 2  (two) times daily with a meal. 60 tablet 2  ? mometasone (ELOCON) 0.1 % cream Apply 1 application topically daily as needed (eczema on ears).    ? nitroGLYCERIN (NITROSTAT) 0.4 MG SL tablet Place 1 tablet (0.4 mg total) under the tongue every 5 (five) minutes as needed for chest pain. 25 tablet 3  ? olopatadine (PATANOL) 0.1 % ophthalmic solution Place 1 drop into both eyes 2 (two) times daily.    ? pantoprazole (PROTONIX) 40 MG tablet TAKE (1) TABLET BY MOUTH TWICE DAILY BEFORE A MEAL. 60 tablet 5  ? polyethylene glycol-electrolytes (TRILYTE) 420 g solution Take 4,000 mLs by mouth as directed. 4000 mL 0  ? traZODone (DESYREL) 50 MG tablet Take 0.5-1 tablets (25-50 mg total) by mouth at bedtime as needed for sleep. 30 tablet 3  ? TRULICITY 1.5 GU/5.4YH SOPN INJECT 1 1/2 MG INTO THE SKIN ONCE WEEKLY. (Patient taking differently: Inject 1.5 mg as directed every Monday.) 2 mL 2  ? ?No current facility-administered medications for this visit.  ? ? ? ?Past Medical History:  ?Diagnosis Date  ? ACE inhibitor-aggravated angioedema   ? Allergy   ? Angio-edema   ? Anxiety   ? Arthritis   ? Asthma   ? hip replacement  ? Back pain   ? Bradycardia 04/28/2017  ? Bulging of cervical intervertebral disc   ? CAD (coronary artery disease)   ? lateral STEMI 02/06/2015 00% D1 occlusion treated with Promus Premier 2.5 mm x 16 mm DES, 70% ramus stenosis, 40% mid RCA stenosis, 45% distal RCA stenosis, EF 45-50%  ? CHF (congestive heart failure) ([place])   ? COPD (chronic obstructive pulmonary disease) ([place])   ? Depression   ? Diabetes mellitus without complication ([place])   ? Difficult intubation   ? 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  ? Dry eye   ? Dyspnea   ? Early satiety 09/23/2016  ? GERD (gastroesophageal reflux disease)   ? HCAP (healthcare-associated pneumonia) 05/15/2017  ? Headache   ? Heart murmur   ? Hip pain   ? Hyperlipidemia   ? Hypertension   ? Lobar pneumonia ([place]) 05/15/2017  ? Melena 08/04/2018  ? MI (myocardial  infarction) ([place])   ? Myocardial infarction [facility])   ? Neck pain   ? Non-ST elevation (NSTEMI) myocardial infarction ([place]) 04/27/2017  ? NSTEMI (non-ST elevated myocardial infarction) ([place]) 04/26/2017  ? Otitis media   ? Pleurisy   ? Pneumonia due to COVID-19 virus 10/07/2019  ? Rectal bleeding 11/08/2018  ? Right shoulder pain 03/20/2020  ? Sinus pause   ? 9 sec sinus pause on telemetry after started  on coreg after MI, avoid AV nodal blocking agent  ? Sleep apnea   ? 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 STEMI (ST elevation myocardial infarction) ([place]) 05/31/2015  ? Substance abuse ([place])   ? alcoholic  ? Syncope 06/29/2017  ? Syncope and collapse 05/15/2017  ? Transaminitis 08/04/2018  ? Unilateral primary osteoarthritis, right hip 07/01/2016  ? ? ?ROS: ? ? All systems reviewed and negative except as noted in the HPI. ? ? ?Past Surgical History:  ?Procedure Laterality Date  ? BIOPSY  10/09/2016  ? Procedure: BIOPSY;  Surgeon: Daneil Dolin, MD;  Location[facility];  Service: Endoscopy;;  ? BIOPSY  11/28/2019  ? Procedure: BIOPSY;  Surgeon: Daneil Dolin, MD;  Location: [facility];  Service: Endoscopy;;  ? CARDIAC CATHETERIZATION N/A 02/06/2015  ? Procedure: Left Heart Cath and Coronary Angiography;  Surgeon: Leonie Man, MD;  Location: [place] CV LAB;  Service: Cardiovascular;  Laterality: N/A;  ? CARDIAC CATHETERIZATION N/A 02/06/2015  ? Procedure: Coronary Stent Intervention;  Surgeon: Leonie Man, MD;  Location: [place] CV LAB;  Service: Cardiovascular;  Laterality: N/A;  ? COLONOSCOPY WITH PROPOFOL N/A 10/09/2016  ? Sigmoid and descending colon diverticulosis, four 4-6 mm polyps in sigmoid, one 4 mm polyp in descending. Tubular adenomas and hyperplastic. 5 year surveillance.   ? COLONOSCOPY WITH PROPOFOL N/A 11/24/2016  ? Sigmoid and descending colon diverticulosis, four 4-6 mm polyps in sigmoid, one 4 mm polyp in descending. Tubular adenomas and hyperplastic. 5 year  surveillance.   ? COLONOSCOPY WITH PROPOFOL N/A 10/18/2021  ? [place]: nonbleeding internal hemorrhoids, small and large mouth diverticula found in the sigmoid, descending, transverse colon.  A 9 mm sessile polyp wa

## 2021-12-19 NOTE — Patient Instructions (Signed)
Medication Instructions:  ?Your physician recommends that you continue on your current medications as directed. Please refer to the Current Medication list given to you today. ? ?*If you need a refill on your cardiac medications before your next appointment, please call your pharmacy* ? ? ?Lab Work: ?NONE  ? ?If you have labs (blood work) drawn today and your tests are completely normal, you will receive your results only by: ?MyChart Message (if you have MyChart) OR ?A paper copy in the mail ?If you have any lab test that is abnormal or we need to change your treatment, we will call you to review the results. ? ? ?Testing/Procedures: ?NONE  ? ? ?Follow-Up: ?At [facility], you and your health needs are our priority.  As part of our continuing mission to provide you with exceptional heart care, we have created designated Provider Care Teams.  These Care Teams include your primary Cardiologist (physician) and Advanced Practice Providers (APPs -  Physician Assistants and Nurse Practitioners) who all work together to provide you with the care you need, when you need it. ? ?We recommend signing up for the patient portal called "MyChart".  Sign up information is provided on this After Visit Summary.  MyChart is used to connect with patients for Virtual Visits (Telemedicine).  Patients are able to view lab/test results, encounter notes, upcoming appointments, etc.  Non-urgent messages can be sent to your provider as well.   ?To learn more about what you can do with MyChart, go to [email or web address].   ? ?Your next appointment:   ?1 year(s) ? ?The format for your next appointment:   ?In Person ? ?Provider:   ?Cristopher Peru, MD  ? ? ?Other Instructions ?Thank you for choosing [facility]! ? ? ? ?Important Information About Sugar ? ? ? ? ? ? ?

## 2021-12-25 NOTE — Progress Notes (Signed)
? ?Cardiology Office Note   ? ?Date:  12/26/2021  ? ?ID:  Randy Hebert, DOB [birth year], MRN [id number] ? ?PCP:  Lindell Spar, MD  ?Cardiologist: Carlyle Dolly, MD   ?EP: Dr. Lovena Le ? ?Chief Complaint  ?Patient presents with  ? Follow-up  ?  6 month visit  ? ? ?History of Present Illness:   ? ?Randy Hebert is a 56 y.o. 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 ? ?He was last examined by Dr. [name] in 04/2021 and reported baseline dyspnea on exertion in the setting of COPD but denied any recent chest pain. Medical therapy for his cardiomyopathy had been limited as he was not on beta-blocker therapy given history of sinus pauses and previously had severe angioedema with ACE inhibitor which limited the use of an ARB or Entresto. Repeat echocardiogram was obtained and showed that his EF was at 45 to 50%. He was ultimately started on Farxiga 10 mg daily to help with his cardiomyopathy. ? ?He did follow-up with Dr. Lovena Le most recently on 12/19/2021 and reported some dizzy spells but denied any recurrent syncope. His ILR had reached ERI and he did not wish to have it removed at that time. No changes were made to his regimen at that time. ? ?In talking with the patient today, he reports overall feeling well since his last office visit. Reports that his energy level and dyspnea have improved over the past few months. He has reduced his tobacco use to less than 1 pack/day and states he was previously consuming a lot of whiskey but no longer consumes liquor after being diagnosed with cirrhosis. He does consume an occasional beer. He denies any specific exertional chest pain or palpitations. No recent orthopnea, PND or pitting edema. He does use his CPAP at night. Has Lasix to use as needed but has not utilized  this recently.  ? ? ?Past Medical History:  ?Diagnosis Date  ? ACE inhibitor-aggravated angioedema   ? Allergy   ? Angio-edema   ? Anxiety   ? Arthritis   ? Asthma   ? hip replacement  ? Back pain   ? Bradycardia 04/28/2017  ? Bulging of cervical intervertebral disc   ? CAD (coronary artery disease)   ? lateral STEMI 02/06/2015 00% D1 occlusion treated with Promus Premier 2.5 mm x 16 mm DES, 70% ramus stenosis, 40% mid RCA stenosis, 45% distal RCA stenosis, EF 45-50%  ? CHF (congestive heart failure) ([place])   ? COPD (chronic obstructive pulmonary disease) ([place])   ? Depression   ? Diabetes mellitus without complication ([place])   ? Difficult intubation   ? 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  ? Dry eye   ? Dyspnea   ? Early satiety 09/23/2016  ? GERD (gastroesophageal reflux disease)   ? HCAP (healthcare-associated pneumonia) 05/15/2017  ? Headache   ? Heart murmur   ? Hip pain   ? Hyperlipidemia   ? Hypertension   ? Lobar pneumonia ([place]) 05/15/2017  ? Melena 08/04/2018  ? MI (myocardial infarction) ([place])   ? Myocardial infarction [facility])   ? Neck pain   ? Non-ST elevation (NSTEMI) myocardial infarction ([place]) 04/27/2017  ? NSTEMI (non-ST elevated myocardial infarction) ([place]) 04/26/2017  ? Otitis media   ?  Pleurisy   ? Pneumonia due to COVID-19 virus 10/07/2019  ? Rectal bleeding 11/08/2018  ? Right shoulder pain 03/20/2020  ? Sinus pause   ? 9 sec sinus pause on telemetry after started on coreg after MI, avoid AV nodal blocking agent  ? Sleep apnea   ? 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 STEMI (ST elevation myocardial infarction) ([place]) 05/31/2015  ? Substance abuse ([place])   ? alcoholic  ? Syncope 06/29/2017  ? Syncope and collapse 05/15/2017  ? Transaminitis 08/04/2018  ? Unilateral primary osteoarthritis, right hip 07/01/2016  ? ? ?Past Surgical History:  ?Procedure Laterality Date  ? BIOPSY  10/09/2016  ? Procedure: BIOPSY;  Surgeon: Daneil Dolin, MD;  [facility];  Service: Endoscopy;;   ? BIOPSY  11/28/2019  ? Procedure: BIOPSY;  Surgeon: Daneil Dolin, MD;  [facility];  Service: Endoscopy;;  ? CARDIAC CATHETERIZATION N/A 02/06/2015  ? Procedure: Left Heart Cath and Coronary Angiography;  Surgeon: Leonie Man, MD;  Location: [place] [facility];  Service: Cardiovascular;  Laterality: N/A;  ? CARDIAC CATHETERIZATION N/A 02/06/2015  ? Procedure: Coronary Stent Intervention;  Surgeon: Leonie Man, MD;  Location: [place] [facility];  Service: Cardiovascular;  Laterality: N/A;  ? COLONOSCOPY WITH PROPOFOL N/A 10/09/2016  ? Sigmoid and descending colon diverticulosis, four 4-6 mm polyps in sigmoid, one 4 mm polyp in descending. Tubular adenomas and hyperplastic. 5 year surveillance.   ? COLONOSCOPY WITH PROPOFOL N/A 11/24/2016  ? Sigmoid and descending colon diverticulosis, four 4-6 mm polyps in sigmoid, one 4 mm polyp in descending. Tubular adenomas and hyperplastic. 5 year surveillance.   ? COLONOSCOPY WITH PROPOFOL N/A 10/18/2021  ? Carver: nonbleeding internal hemorrhoids, small and large mouth diverticula found in the sigmoid, descending, transverse colon.  A 9 mm sessile polyp was found in the ascending colon that was removed.  4 sessile polyps found in the sigmoid, descending, transverse colon 3 to 5 mm in size, examining otherwise.  Path revealed tubular adenomas.  Repeat due in 3 to 5 years for surveillance.  ? CORONARY ANGIOPLASTY WITH STENT PLACEMENT  01/2015  ? CORONARY STENT INTERVENTION N/A 04/27/2017  ? Procedure: CORONARY STENT INTERVENTION;  Surgeon: Nelva Bush, MD;  Location: [place] [facility];  Service: Cardiovascular;  Laterality: N/A;  ? ELECTROPHYSIOLOGY STUDY N/A 06/29/2017  ? Procedure: ELECTROPHYSIOLOGY STUDY;  Surgeon: Evans Lance, MD;  Location: [place] [facility];  Service: Cardiovascular;  Laterality: N/A;  ? ESOPHAGOGASTRODUODENOSCOPY (EGD) WITH PROPOFOL N/A 10/09/2016  ? Dr. Gala Romney: LA grade a esophagitis.  Barrett's esophagus,  biopsy-proven.  Small hiatal hernia.  EGD February 2019  ? ESOPHAGOGASTRODUODENOSCOPY (EGD) WITH PROPOFOL N/A 11/28/2019  ?  salmon-colored esophageal mucosa (Barrett's) small hiatal hernia, portal hypertensive gastropathy, normal duodenum, 3 year surveillance  ? ESOPHAGOGASTRODUODENOSCOPY (EGD) WITH PROPOFOL N/A 09/06/2021  ? three columns of grade 1 varices in distal esophagus, no stigmata of bleeding or red wale signs. Small hiatal hernia. Mild portal gastropathy. Normal duodenum. Repeat upper endoscopy in 1 year for surveillance.  ? INCISION / DRAINAGE HAND / FINGER    ? LEFT HEART CATH AND CORONARY ANGIOGRAPHY N/A 04/27/2017  ? Procedure: LEFT HEART CATH AND CORONARY ANGIOGRAPHY;  Surgeon: Nelva Bush, MD;  Location: [place] [facility];  Service: Cardiovascular;  Laterality: N/A;  ? LOOP RECORDER INSERTION  06/29/2017  ? Procedure: Loop Recorder Insertion;  Surgeon: Evans Lance, MD;  Location: [place] [facility];  Service: Cardiovascular;;  ? POLYPECTOMY  11/24/2016  ? Procedure: POLYPECTOMY;  Surgeon: Daneil Dolin, MD;  [facility];  Service: Endoscopy;;  descending and sigmoid  ? POLYPECTOMY  10/18/2021  ? Procedure: POLYPECTOMY;  Surgeon: Eloise Harman, DO;  [facility];  Service: Endoscopy;;  ? TOTAL HIP ARTHROPLASTY Right 07/01/2016  ? TOTAL HIP ARTHROPLASTY Right 07/01/2016  ? Procedure: RIGHT TOTAL HIP ARTHROPLASTY ANTERIOR APPROACH;  Surgeon: Mcarthur Rossetti, MD;  Location: [place];  Service: Orthopedics;  Laterality: Right;  ? ? ?Current Medications: ?Outpatient Medications Prior to Visit  ?Medication Sig Dispense Refill  ? acetaminophen (TYLENOL) 500 MG tablet Take 2 tablets (1,000 mg total) by mouth every 8 (eight) hours as needed for mild pain or fever.    ? albuterol (PROVENTIL) (2.5 MG/3ML) 0.083% nebulizer solution Take 3 mLs (2.5 mg total) by nebulization every 6 (six) hours as needed for wheezing or shortness of breath. 150 mL 1  ? albuterol (VENTOLIN  HFA) 108 (90 Base) MCG/ACT inhaler INHALE 2 PUFFS INTO THE LUNGS EVERY 6 HOURS AS NEEDEDFOR SHORTNESS OF BREATH OR WHEEZING. 18 g 1  ? amLODipine (NORVASC) 10 MG tablet Take 1 tablet (10 mg total) by mouth daily. [place]

## 2021-12-26 ENCOUNTER — Ambulatory Visit (INDEPENDENT_AMBULATORY_CARE_PROVIDER_SITE_OTHER): Payer: Medicare Other | Admitting: Student

## 2021-12-26 ENCOUNTER — Encounter: Payer: Self-pay | Admitting: Student

## 2021-12-26 VITALS — BP 118/66 | HR 80 | Ht 65.0 in | Wt 201.8 lb

## 2021-12-26 DIAGNOSIS — E782 Mixed hyperlipidemia: Secondary | ICD-10-CM | POA: Diagnosis not present

## 2021-12-26 DIAGNOSIS — I10 Essential (primary) hypertension: Secondary | ICD-10-CM | POA: Diagnosis not present

## 2021-12-26 DIAGNOSIS — I5020 Unspecified systolic (congestive) heart failure: Secondary | ICD-10-CM

## 2021-12-26 DIAGNOSIS — I2510 Atherosclerotic heart disease of native coronary artery without angina pectoris: Secondary | ICD-10-CM

## 2021-12-26 DIAGNOSIS — G4733 Obstructive sleep apnea (adult) (pediatric): Secondary | ICD-10-CM

## 2021-12-26 NOTE — Patient Instructions (Signed)
Medication Instructions:  ?Your physician recommends that you continue on your current medications as directed. Please refer to the Current Medication list given to you today. ? ?*If you need a refill on your cardiac medications before your next appointment, please call your pharmacy* ? ? ?Lab Work: ?NONE  ? ?If you have labs (blood work) drawn today and your tests are completely normal, you will receive your results only by: ?MyChart Message (if you have MyChart) OR ?A paper copy in the mail ?If you have any lab test that is abnormal or we need to change your treatment, we will call you to review the results. ? ? ?Testing/Procedures: ?NONE  ? ? ?Follow-Up: ?At [facility], you and your health needs are our priority.  As part of our continuing mission to provide you with exceptional heart care, we have created designated Provider Care Teams.  These Care Teams include your primary Cardiologist (physician) and Advanced Practice Providers (APPs -  Physician Assistants and Nurse Practitioners) who all work together to provide you with the care you need, when you need it. ? ?We recommend signing up for the patient portal called "MyChart".  Sign up information is provided on this After Visit Summary.  MyChart is used to connect with patients for Virtual Visits (Telemedicine).  Patients are able to view lab/test results, encounter notes, upcoming appointments, etc.  Non-urgent messages can be sent to your provider as well.   ?To learn more about what you can do with MyChart, go to [email or web address].   ? ?Your next appointment:   ?6 month(s) ? ?The format for your next appointment:   ?In Person ? ?Provider:   ?You may see Carlyle Dolly, MD or one of the following Advanced Practice Providers on your designated Care Team:   ?Bernerd Pho, PA-C  ?Ermalinda Barrios, PA-C   ? ? ?Other Instructions ?Thank you for choosing [facility]! ?  ? ?Important Information About Sugar ? ? ? ? ?  ?

## 2022-01-07 ENCOUNTER — Encounter: Payer: Self-pay | Admitting: Gastroenterology

## 2022-01-07 ENCOUNTER — Ambulatory Visit (INDEPENDENT_AMBULATORY_CARE_PROVIDER_SITE_OTHER): Payer: Medicare Other | Admitting: Gastroenterology

## 2022-01-07 DIAGNOSIS — K7030 Alcoholic cirrhosis of liver without ascites: Secondary | ICD-10-CM | POA: Diagnosis not present

## 2022-01-07 MED ORDER — LACTULOSE 10 GM/15ML PO SOLN
10.0000 g | Freq: Every day | ORAL | 6 refills | Status: DC
Start: 2022-01-07 — End: 2022-07-15

## 2022-01-07 NOTE — Patient Instructions (Signed)
I have refilled lactulose. Take 15 milliliters by mouth daily. We are trying to achieve 2-3 soft bowel movements a day. Let me know if this is not helpful. ? ?Please complete blood work when you see Dr. Dorris Fetch. I have given you orders of what we would need. ? ?Your next ultrasound will be due in July! We will arrange it when it comes closer. ? ?Please call if no improvement. ? ?We will see you in 6 months! ? ?I enjoyed seeing you again today! As you know, I value our relationship and want to provide genuine, compassionate, and quality care. I welcome your feedback. If you receive a survey regarding your visit,  I greatly appreciate you taking time to fill this out. See you next time! ? ?Annitta Needs, PhD, ANP-BC ?[facility]  ? ?

## 2022-01-07 NOTE — Progress Notes (Signed)
? ? ? ? ? ?Gastroenterology Office Note   ? ? ?Primary Care Physician:  Lindell Spar, MD  ?Primary Gastroenterologist: Dr. Gala Romney  ? ? ?Chief Complaint  ? ?Chief Complaint  ?Patient presents with  ? Abdominal Pain  ?  Pt does admit to not eating or drinking like he is suppose to  ? ? ? ?History of Present Illness  ? ?Randy Hebert is a 56 y.o. 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 Received blood transfusion. EGD while inpatient with three columns of Grade 1 varices in distal esophagus but no stigmata of bleeding or red wale signs. Small hiatal hernia. Mild portal gastropathy. 1 year surveillance needed. colonoscopy October 18, 2021 with nonbleeding internal hemorrhoids, small and large mouth diverticula found in the sigmoid, descending, transverse colon.  A 9 mm sessile polyp was found in the ascending colon that was removed.  4 sessile polyps found in the sigmoid, descending, transverse colon 3 to 5 mm in size, examining otherwise.  Path revealed tubular adenomas.  Repeat due in 5 years for surveillance. ? ? ?Sharp pains lower abdomen, feels he needs to have a BM but only small amount. Drinks coffee and has another small BM but not enough. Ran out of lactulose a few weeks ago. Symptoms started after running out of lactulose. Associated gas. Gold colored stool. Had been taking Lactulose 15 ml daily. Ran out a few weeks ago. No fever or chills. No mental status changes or confusion.  ? ?Has had about 12 beers in the span of a few weeks.  ? ? ? ?Past Medical History:  ?Diagnosis Date  ? ACE inhibitor-aggravated angioedema   ? Allergy   ? Angio-edema   ? Anxiety   ? Arthritis   ? Asthma   ? hip replacement  ? Back pain   ? Bradycardia 04/28/2017  ? Bulging of cervical intervertebral disc   ? CAD (coronary artery disease)   ? lateral STEMI 02/06/2015 00% D1 occlusion treated with Promus Premier 2.5 mm x 16 mm DES, 70%  ramus stenosis, 40% mid RCA stenosis, 45% distal RCA stenosis, EF 45-50%  ? CHF (congestive heart failure) ([place])   ? COPD (chronic obstructive pulmonary disease) ([place])   ? Depression   ? Diabetes mellitus without complication ([place])   ? Difficult intubation   ? 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  ? Dry eye   ? Dyspnea   ? Early satiety 09/23/2016  ? GERD (gastroesophageal reflux disease)   ? HCAP (healthcare-associated pneumonia) 05/15/2017  ? Headache   ? Heart murmur   ? Hip pain   ? Hyperlipidemia   ? Hypertension   ? Lobar pneumonia ([place]) 05/15/2017  ? Melena 08/04/2018  ? MI (myocardial infarction) ([place])   ? Myocardial infarction [facility])   ? Neck pain   ? Non-ST elevation (NSTEMI) myocardial infarction ([place]) 04/27/2017  ? NSTEMI (non-ST elevated myocardial infarction) ([place]) 04/26/2017  ? Otitis media   ? Pleurisy   ? Pneumonia due to COVID-19 virus 10/07/2019  ? Rectal bleeding 11/08/2018  ? Right shoulder pain 03/20/2020  ? Sinus pause   ? 9 sec sinus pause on telemetry after started on coreg after MI, avoid AV nodal blocking agent  ? Sleep apnea   ? 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 STEMI (ST elevation myocardial infarction) ([place]) 05/31/2015  ? Substance abuse ([place])   ?  alcoholic  ? Syncope 06/29/2017  ? Syncope and collapse 05/15/2017  ? Transaminitis 08/04/2018  ? Unilateral primary osteoarthritis, right hip 07/01/2016  ? ? ?Past Surgical History:  ?Procedure Laterality Date  ? BIOPSY  10/09/2016  ? Procedure: BIOPSY;  Surgeon: Daneil Dolin, MD;  [facility];  Service: Endoscopy;;  ? BIOPSY  11/28/2019  ? Procedure: BIOPSY;  Surgeon: Daneil Dolin, MD;  [facility];  Service: Endoscopy;;  ? CARDIAC CATHETERIZATION N/A 02/06/2015  ? Procedure: Left Heart Cath and Coronary Angiography;  Surgeon: Leonie Man, MD;  Location: [place] [facility];  Service: Cardiovascular;  Laterality: N/A;  ? CARDIAC CATHETERIZATION N/A 02/06/2015  ? Procedure: Coronary Stent  Intervention;  Surgeon: Leonie Man, MD;  Location: [place] [facility];  Service: Cardiovascular;  Laterality: N/A;  ? COLONOSCOPY WITH PROPOFOL N/A 10/09/2016  ? Sigmoid and descending colon diverticulosis, four 4-6 mm polyps in sigmoid, one 4 mm polyp in descending. Tubular adenomas and hyperplastic. 5 year surveillance.   ? COLONOSCOPY WITH PROPOFOL N/A 11/24/2016  ? Sigmoid and descending colon diverticulosis, four 4-6 mm polyps in sigmoid, one 4 mm polyp in descending. Tubular adenomas and hyperplastic. 5 year surveillance.   ? COLONOSCOPY WITH PROPOFOL N/A 10/18/2021  ? [place]: nonbleeding internal hemorrhoids, small and large mouth diverticula found in the sigmoid, descending, transverse colon.  A 9 mm sessile polyp was found in the ascending colon that was removed.  4 sessile polyps found in the sigmoid, descending, transverse colon 3 to 5 mm in size, examining otherwise.  Path revealed tubular adenomas.  Repeat due in 3 to 5 years for surveillance.  ? CORONARY ANGIOPLASTY WITH STENT PLACEMENT  01/2015  ? CORONARY STENT INTERVENTION N/A 04/27/2017  ? Procedure: CORONARY STENT INTERVENTION;  Surgeon: Nelva Bush, MD;  Location: [place] [facility];  Service: Cardiovascular;  Laterality: N/A;  ? ELECTROPHYSIOLOGY STUDY N/A 06/29/2017  ? Procedure: ELECTROPHYSIOLOGY STUDY;  Surgeon: Evans Lance, MD;  Location: [place] [facility];  Service: Cardiovascular;  Laterality: N/A;  ? ESOPHAGOGASTRODUODENOSCOPY (EGD) WITH PROPOFOL N/A 10/09/2016  ? Dr. Gala Romney: LA grade a esophagitis.  Barrett's esophagus, biopsy-proven.  Small hiatal hernia.  EGD February 2019  ? ESOPHAGOGASTRODUODENOSCOPY (EGD) WITH PROPOFOL N/A 11/28/2019  ?  salmon-colored esophageal mucosa (Barrett's) small hiatal hernia, portal hypertensive gastropathy, normal duodenum, 3 year surveillance  ? ESOPHAGOGASTRODUODENOSCOPY (EGD) WITH PROPOFOL N/A 09/06/2021  ? three columns of grade 1 varices in distal esophagus, no stigmata of bleeding or  red wale signs. Small hiatal hernia. Mild portal gastropathy. Normal duodenum. Repeat upper endoscopy in 1 year for surveillance.  ? INCISION / DRAINAGE HAND / FINGER    ? LEFT HEART CATH AND CORONARY ANGIOGRAPHY N/A 04/27/2017  ? Procedure: LEFT HEART CATH AND CORONARY ANGIOGRAPHY;  Surgeon: Nelva Bush, MD;  Location: [place] [facility];  Service: Cardiovascular;  Laterality: N/A;  ? LOOP RECORDER INSERTION  06/29/2017  ? Procedure: Loop Recorder Insertion;  Surgeon: Evans Lance, MD;  Location: [place] [facility];  Service: Cardiovascular;;  ? POLYPECTOMY  11/24/2016  ? Procedure: POLYPECTOMY;  Surgeon: Daneil Dolin, MD;  [facility];  Service: Endoscopy;;  descending and sigmoid  ? POLYPECTOMY  10/18/2021  ? Procedure: POLYPECTOMY;  Surgeon: Eloise Harman, DO;  [facility];  Service: Endoscopy;;  ? TOTAL HIP ARTHROPLASTY Right 07/01/2016  ? TOTAL HIP ARTHROPLASTY Right 07/01/2016  ? Procedure: RIGHT TOTAL HIP ARTHROPLASTY ANTERIOR APPROACH;  Surgeon:  Mcarthur Rossetti, MD;  Location: [place];  Service: Orthopedics;  Laterality: Right;  ? ? ?Current Outpatient Medications  ?Medication Sig Dispense Refill  ? acetaminophen (TYLENOL) 500 MG tablet Take 2 tablets (1,000 mg total) by mouth every 8 (eight) hours as needed for mild pain or fever.    ? albuterol (PROVENTIL) (2.5 MG/3ML) 0.083% nebulizer solution Take 3 mLs (2.5 mg total) by nebulization every 6 (six) hours as needed for wheezing or shortness of breath. 150 mL 1  ? albuterol (VENTOLIN HFA) 108 (90 Base) MCG/ACT inhaler INHALE 2 PUFFS INTO THE LUNGS EVERY 6 HOURS AS NEEDEDFOR SHORTNESS OF BREATH OR WHEEZING. 18 g 1  ? amLODipine (NORVASC) 10 MG tablet Take 1 tablet (10 mg total) by mouth daily. 90 tablet 0  ? ascorbic acid (VITAMIN C) 500 MG tablet Take 1 tablet (500 mg total) by mouth daily. 30 tablet 1  ? aspirin 81 MG EC tablet Take 1 tablet (81 mg total) by mouth daily. (Patient taking differently: Take 81 mg by  mouth in the morning and at bedtime.) 30 tablet   ? atorvastatin (LIPITOR) 40 MG tablet TAKE 1 TABLET BY MOUTH ONCE A DAY. (Patient taking differently: Take 40 mg by mouth at bedtime.) 90 tablet 1  ? A

## 2022-01-20 ENCOUNTER — Other Ambulatory Visit: Payer: Self-pay | Admitting: "Endocrinology

## 2022-01-23 ENCOUNTER — Ambulatory Visit (INDEPENDENT_AMBULATORY_CARE_PROVIDER_SITE_OTHER): Payer: Medicare Other | Admitting: *Deleted

## 2022-01-23 ENCOUNTER — Telehealth: Payer: Self-pay | Admitting: Internal Medicine

## 2022-01-23 DIAGNOSIS — E1159 Type 2 diabetes mellitus with other circulatory complications: Secondary | ICD-10-CM

## 2022-01-23 DIAGNOSIS — I5022 Chronic systolic (congestive) heart failure: Secondary | ICD-10-CM

## 2022-01-23 NOTE — Chronic Care Management (AMB) (Signed)
Chronic Care Management   CCM RN Visit Note  01/23/2022 Name: Randy Hebert MRN: [id number] DOB: 1965/10/20  Subjective: Randy Hebert is a 56 y.o. year old male who is a primary care patient of Lindell Spar, MD. The care management team was consulted for assistance with disease management and care coordination needs.    Engaged with patient by telephone for initial visit in response to provider referral for case management and/or care coordination services.   Consent to Services:  The patient was given the following information about Chronic Care Management services today, agreed to services, and gave verbal consent: 1. CCM service includes personalized support from designated clinical staff supervised by the primary care provider, including individualized plan of care and coordination with other care providers 2. 24/7 contact phone numbers for assistance for urgent and routine care needs. 3. Service will only be billed when office clinical staff spend 20 minutes or more in a month to coordinate care. 4. Only one practitioner may furnish and bill the service in a calendar month. 5.The patient may stop CCM services at any time (effective at the end of the month) by phone call to the office staff. 6. The patient will be responsible for cost sharing (co-pay) of up to 20% of the service fee (after annual deductible is met). Patient agreed to services and consent obtained.  Patient agreed to services and verbal consent obtained.   Assessment: Review of patient past medical history, allergies, medications, health status, including review of consultants reports, laboratory and other test data, was performed as part of comprehensive evaluation and provision of chronic care management services.   SDOH (Social Determinants of Health) assessments and interventions performed:  SDOH Interventions    Flowsheet Row Most Recent Value  SDOH Interventions   Food Insecurity Interventions Intervention  Not Indicated  Transportation Interventions Intervention Not Indicated        CCM Care Plan  Allergies  Allergen Reactions   Carvedilol Other (See Comments)    Sinus pause on telemetry >3 seconds. Longest one 9 sec. No AV nodal agent   Lisinopril Anaphylaxis, Shortness Of Breath and Swelling    Angioedema, required intubation and mechanical ventilation   Chantix [Varenicline] Other (See Comments)    Nightmares and unable to sleep well   Amoxicillin Nausea Only    Did it involve swelling of the face/tongue/throat, SOB, or low BP? No Did it involve sudden or severe rash/hives, skin peeling, or any reaction on the inside of your mouth or nose? No Did you need to seek medical attention at a hospital or doctor's office? No When did it last happen? childhood reaction      If all above answers are "NO", may proceed with cephalosporin use.     Outpatient Encounter Medications as of 01/23/2022  Medication Sig Note   acetaminophen (TYLENOL) 500 MG tablet Take 2 tablets (1,000 mg total) by mouth every 8 (eight) hours as needed for mild pain or fever. 01/07/2022: As needed   albuterol (PROVENTIL) (2.5 MG/3ML) 0.083% nebulizer solution Take 3 mLs (2.5 mg total) by nebulization every 6 (six) hours as needed for wheezing or shortness of breath.    albuterol (VENTOLIN HFA) 108 (90 Base) MCG/ACT inhaler INHALE 2 PUFFS INTO THE LUNGS EVERY 6 HOURS AS NEEDEDFOR SHORTNESS OF BREATH OR WHEEZING.    amLODipine (NORVASC) 10 MG tablet Take 1 tablet (10 mg total) by mouth daily.    ascorbic acid (VITAMIN C) 500 MG tablet Take 1 tablet (500  mg total) by mouth daily.    aspirin 81 MG EC tablet Take 1 tablet (81 mg total) by mouth daily. (Patient taking differently: Take 81 mg by mouth in the morning and at bedtime.)    atorvastatin (LIPITOR) 40 MG tablet TAKE 1 TABLET BY MOUTH ONCE A DAY. (Patient taking differently: Take 40 mg by mouth at bedtime.)    Azelastine HCl 137 MCG/SPRAY SOLN USE 2 SPRAYS IN EACH  NOSTRIL TWICE DAILY.    Budeson-Glycopyrrol-Formoterol (BREZTRI AEROSPHERE) 160-9-4.8 MCG/ACT AERO Inhale 2 puffs into the lungs 2 (two) times daily.    budesonide-formoterol (SYMBICORT) 160-4.5 MCG/ACT inhaler Inhale 2 puffs into the lungs 2 (two) times daily.    Cholecalciferol (VITAMIN D3) 125 MCG (5000 UT) CAPS Take 1 capsule (5,000 Units total) by mouth daily.    citalopram (CELEXA) 20 MG tablet Take 20 mg by mouth daily.    cycloSPORINE (RESTASIS) 0.05 % ophthalmic emulsion Place 1 drop into both eyes 2 (two) times daily.    FARXIGA 10 MG TABS tablet TAKE ONE TABLET BY MOUTH ONCE DAILY.    ferrous sulfate 325 (65 FE) MG tablet Take 1 tablet (325 mg total) by mouth daily with breakfast.    furosemide (LASIX) 40 MG tablet Take 1 tablet (40 mg total) by mouth daily as needed for fluid. 01/07/2022: As needed   glipiZIDE (GLUCOTROL XL) 5 MG 24 hr tablet TAKE 1 TABLET BY MOUTH DAILY WITH BREAKFAST.    glucose blood (ACCU-CHEK AVIVA PLUS) test strip Use as instructed to check blood glucose three times daily.    lactulose (CHRONULAC) 10 GM/15ML solution Take 15 mLs (10 g total) by mouth daily.    magnesium gluconate (MAGONATE) 500 MG tablet Take 500 mg by mouth 2 (two) times daily.    Melatonin 10 MG CAPS Take 10 mg by mouth at bedtime. 01/07/2022: As needed   metFORMIN (GLUCOPHAGE) 500 MG tablet Take 1 tablet (500 mg total) by mouth 2 (two) times daily with a meal.    mometasone (ELOCON) 0.1 % cream Apply 1 application topically daily as needed (eczema on ears).    nitroGLYCERIN (NITROSTAT) 0.4 MG SL tablet Place 1 tablet (0.4 mg total) under the tongue every 5 (five) minutes as needed for chest pain.    olopatadine (PATANOL) 0.1 % ophthalmic solution Place 1 drop into both eyes 2 (two) times daily.    pantoprazole (PROTONIX) 40 MG tablet TAKE (1) TABLET BY MOUTH TWICE DAILY BEFORE A MEAL.    TRULICITY 1.5 MG/0.5ML SOPN INJECT 1.5 MG INTO THE SKIN ONCE WEEKLY.    No facility-administered encounter  medications on file as of 01/23/2022.    Patient Active Problem List   Diagnosis Date Noted   Folliculitis 11/21/2021   Nevus 11/21/2021   Callus of foot 11/21/2021   History of GI bleed 11/13/2021   History of colonic polyps 09/18/2021   Insomnia 09/13/2021   Obesity (BMI 30.0-34.9) 09/03/2021   Ulnar tunnel syndrome, left 07/24/2021   Iron deficiency anemia due to chronic blood loss 04/02/2021   Carpal tunnel syndrome 12/27/2019   Seasonal and perennial allergic rhinitis 11/10/2018   Cirrhosis of liver (HCC) 11/08/2018   Barrett's esophagus 08/04/2018   Vitamin D deficiency 06/30/2018   Alcohol abuse 06/30/2018   DM type 2 causing vascular disease (HCC) 08/17/2017   Chronic combined systolic and diastolic CHF (congestive heart failure) (HCC) 05/15/2017   OSA (obstructive sleep apnea) 04/28/2017   Depression with anxiety 04/15/2017   GERD (gastroesophageal reflux disease) 09/23/2016     Asthmatic bronchitis , chronic (HCC) 08/27/2015   Mixed hyperlipidemia 08/27/2015   Class 1 obesity due to excess calories without serious comorbidity with body mass index (BMI) of 34.0 to 34.9 in adult 08/27/2015   CAD (coronary artery disease) 05/31/2015   Cigarette smoker 05/31/2015   Essential hypertension, benign 02/09/2015    Conditions to be addressed/monitored:CHF and DMII  Care Plan : RN Care Manager Plan of Care  Updates made by Farmer, Julie A, RN since 01/23/2022 12:00 AM     Problem: No plan of care established for management of chronic disease state  (DM2, CHF)   Priority: High     Long-Range Goal: Development of plan of care for chronic disease management  (DM2, CHF)   Start Date: 01/23/2022  Expected End Date: 07/22/2022  Priority: High  Note:   Current Barriers:  Knowledge Deficits related to plan of care for management of CHF and DMII  Chronic Disease Management support and education needs related to CHF and DMII Patient reports he lives alone, is independent with all  aspects of his care, has some extended family members he can call on if needed, has had over 2 falls in the past year, uses CPAP at hs, checks CBG Q 2-3 weeks with readings usually in 100's range with today's reading 165, does try to follow a special diet and eat healthy, does not have a scale but states he will get one and start weighing daily,used to be monitored through telehealth program with [employer] but discontinued the program due to did not like. Patient reports he has cirrhosis and is not currently drinking alcohol.  RNCM Clinical Goal(s):  Patient will verbalize understanding of plan for management of CHF and DMII as evidenced by patient report, review of EHR and  through collaboration with RN Care manager, provider, and care team.   Interventions: 1:1 collaboration with primary care provider regarding development and update of comprehensive plan of care as evidenced by provider attestation and co-signature Inter-disciplinary care team collaboration (see longitudinal plan of care) Evaluation of current treatment plan related to  self management and patient's adherence to plan as established by provider   Heart Failure Interventions:  (Status:  New goal. and Goal on track:  Yes.) Long Term Goal Basic overview and discussion of pathophysiology of Heart Failure reviewed Provided education on low sodium diet Reviewed Heart Failure Action Plan in depth and provided written copy Assessed need for readable accurate scales in home Provided education about placing scale on hard, flat surface Advised patient to weigh each morning after emptying bladder Discussed importance of daily weight and advised patient to weigh and record daily Screening for signs and symptoms of depression related to chronic disease state  Assessed social determinant of health barriers  Ask patient to obtain scale and begin weighing daily and recording Education mailed- CHF action plan  Diabetes Interventions:  (Status:   New goal. and Goal on track:  Yes.) Long Term Goal Assessed patient's understanding of A1c goal: <7% Provided education to patient about basic DM disease process Reviewed medications with patient and discussed importance of medication adherence Counseled on importance of regular laboratory monitoring as prescribed Discussed plans with patient for ongoing care management follow up and provided patient with direct contact information for care management team Provided patient with written educational materials related to hypo and hyperglycemia and importance of correct treatment Review of patient status, including review of consultants reports, relevant laboratory and other test results, and medications completed Reviewed carbohydrate modified diet   Education mailed- hypoglycemia Safety precautions reviewed Lab Results  Component Value Date   HGBA1C 6.8 (H) 09/03/2021     Patient Goals/Self-Care Activities: Take medications as prescribed   Attend all scheduled provider appointments Call pharmacy for medication refills 3-7 days in advance of running out of medications Attend church or other social activities Perform all self care activities independently  Perform IADL's (shopping, preparing meals, housekeeping, managing finances) independently Call provider office for new concerns or questions  call office if I gain more than 2 pounds in one day or 5 pounds in one week keep legs up while sitting track weight in diary use salt in moderation watch for swelling in feet, ankles and legs every day weigh myself daily develop a rescue plan follow rescue plan if symptoms flare-up eat more whole grains, fruits and vegetables, lean meats and healthy fats check blood sugar at prescribed times: once daily check feet daily for cuts, sores or redness enter blood sugar readings and medication or insulin into daily log take the blood sugar log to all doctor visits take the blood sugar meter to all  doctor visits trim toenails straight across fill half of plate with vegetables wash and dry feet carefully every day wear comfortable, cotton socks Look over education mailed- hypoglycemia and Congestive Heart Failure action plan Obtain a scale and begin weighing daily on hard surface, before eating and dressing, and after emptying bladder Weigh at the same time each day fall prevention strategies: change position slowly, use assistive device such as [place] or cane (per provider recommendations) when walking, keep walkways clear, have good lighting in room. It is important to contact your provider if you have any falls, maintain muscle strength/tone by exercise per provider recommendations.       Plan:Telephone follow up appointment with care management team member scheduled for:  03/11/22  Jacqlyn Larsen [facility], BSN RN Case Manager [facility] [phone]

## 2022-01-23 NOTE — Patient Instructions (Signed)
Visit Information   Thank you for taking time to visit with me today. Please don't hesitate to contact me if I can be of assistance to you before our next scheduled telephone appointment.  Following are the goals we discussed today:  Take medications as prescribed   Attend all scheduled provider appointments Call pharmacy for medication refills 3-7 days in advance of running out of medications Attend church or other social activities Perform all self care activities independently  Perform IADL's (shopping, preparing meals, housekeeping, managing finances) independently Call provider office for new concerns or questions  call office if I gain more than 2 pounds in one day or 5 pounds in one week keep legs up while sitting track weight in diary use salt in moderation watch for swelling in feet, ankles and legs every day weigh myself daily develop a rescue plan follow rescue plan if symptoms flare-up eat more whole grains, fruits and vegetables, lean meats and healthy fats check blood sugar at prescribed times: once daily check feet daily for cuts, sores or redness enter blood sugar readings and medication or insulin into daily log take the blood sugar log to all doctor visits take the blood sugar meter to all doctor visits trim toenails straight across fill half of plate with vegetables wash and dry feet carefully every day wear comfortable, cotton socks Look over education mailed- hypoglycemia and Congestive Heart Failure action plan Obtain a scale and begin weighing daily on hard surface, before eating and dressing, and after emptying bladder Weigh at the same time each day fall prevention strategies: change position slowly, use assistive device such as [place] or cane (per provider recommendations) when walking, keep walkways clear, have good lighting in room. It is important to contact your provider if you have any falls, maintain muscle strength/tone by exercise per provider  recommendations.  Our next appointment is by telephone on 03/11/22 at 3 pm  Please call the care guide team at [phone] if you need to cancel or reschedule your appointment.   If you are experiencing a Mental Health or [place] or need someone to talk to, please call the Suicide and Crisis Lifeline: 988 call the [place] National Suicide Prevention Lifeline: 972-123-2856 or TTY: 8326265220 TTY 267-482-9144) to talk to a trained counselor call 1-800-273-TALK (toll free, 24 hour hotline) go to [facility] [place] [phone]) call the [place]: [phone] call 911   Following is a copy of your full care plan:  Care Plan : RN Care Manager Plan of Care  Updates made by Kassie Mends, RN since 01/23/2022 12:00 AM     Problem: No plan of care established for management of chronic disease state  (DM2, CHF)   Priority: High     Long-Range Goal: Development of plan of care for chronic disease management  (DM2, CHF)   Start Date: 01/23/2022  Expected End Date: 07/22/2022  Priority: High  Note:   Current Barriers:  Knowledge Deficits related to plan of care for management of CHF and DMII  Chronic Disease Management support and education needs related to CHF and DMII Patient reports he lives alone, is independent with all aspects of his care, has some extended family members he can call on if needed, has had over 2 falls in the past year, uses CPAP at hs, checks CBG Q 2-3 weeks with readings usually in 100's range with today's reading 165, does try to follow a special diet and  eat healthy, does not have a scale but states he will get one and start weighing daily,used to be monitored through telehealth program with [facility] but discontinued the program due to did not like. Patient reports he has cirrhosis and is not currently drinking alcohol.  RNCM Clinical Goal(s):  Patient will verbalize  understanding of plan for management of CHF and DMII as evidenced by patient report, review of EHR and  through collaboration with RN Care manager, provider, and care team.   Interventions: 1:1 collaboration with primary care provider regarding development and update of comprehensive plan of care as evidenced by provider attestation and co-signature Inter-disciplinary care team collaboration (see longitudinal plan of care) Evaluation of current treatment plan related to  self management and patient's adherence to plan as established by provider   Heart Failure Interventions:  (Status:  New goal. and Goal on track:  Yes.) Long Term Goal Basic overview and discussion of pathophysiology of Heart Failure reviewed Provided education on low sodium diet Reviewed Heart Failure Action Plan in depth and provided written copy Assessed need for readable accurate scales in home Provided education about placing scale on hard, flat surface Advised patient to weigh each morning after emptying bladder Discussed importance of daily weight and advised patient to weigh and record daily Screening for signs and symptoms of depression related to chronic disease state  Assessed social determinant of health barriers  Ask patient to obtain scale and begin weighing daily and recording Education mailed- CHF action plan  Diabetes Interventions:  (Status:  New goal. and Goal on track:  Yes.) Long Term Goal Assessed patient's understanding of A1c goal: <7% Provided education to patient about basic DM disease process Reviewed medications with patient and discussed importance of medication adherence Counseled on importance of regular laboratory monitoring as prescribed Discussed plans with patient for ongoing care management follow up and provided patient with direct contact information for care management team Provided patient with written educational materials related to hypo and hyperglycemia and importance of correct  treatment Review of patient status, including review of consultants reports, relevant laboratory and other test results, and medications completed Reviewed carbohydrate modified diet Education mailed- hypoglycemia Safety precautions reviewed Lab Results  Component Value Date   HGBA1C 6.8 (H) 09/03/2021     Patient Goals/Self-Care Activities: Take medications as prescribed   Attend all scheduled provider appointments Call pharmacy for medication refills 3-7 days in advance of running out of medications Attend church or other social activities Perform all self care activities independently  Perform IADL's (shopping, preparing meals, housekeeping, managing finances) independently Call provider office for new concerns or questions  call office if I gain more than 2 pounds in one day or 5 pounds in one week keep legs up while sitting track weight in diary use salt in moderation watch for swelling in feet, ankles and legs every day weigh myself daily develop a rescue plan follow rescue plan if symptoms flare-up eat more whole grains, fruits and vegetables, lean meats and healthy fats check blood sugar at prescribed times: once daily check feet daily for cuts, sores or redness enter blood sugar readings and medication or insulin into daily log take the blood sugar log to all doctor visits take the blood sugar meter to all doctor visits trim toenails straight across fill half of plate with vegetables wash and dry feet carefully every day wear comfortable, cotton socks Look over education mailed- hypoglycemia and Congestive Heart Failure action plan Obtain a scale and begin weighing daily  on hard surface, before eating and dressing, and after emptying bladder Weigh at the same time each day fall prevention strategies: change position slowly, use assistive device such as Trem[place] or cane (per provider recommendations) when walking, keep walkways clear, have good lighting in room. It is  important to contact your provider if you have any falls, maintain muscle strength/tone by exercise per provider recommendations.       Consent to CCM Services: Randy Hebert was given information about Chronic Care Management services including:  CCM service includes personalized support from designated clinical staff supervised by his physician, including individualized plan of care and coordination with other care providers 24/7 contact phone numbers for assistance for urgent and routine care needs. Service will only be billed when office clinical staff spend 20 minutes or more in a month to coordinate care. Only one practitioner may furnish and bill the service in a calendar month. The patient may stop CCM services at any time (effective at the end of the month) by phone call to the office staff. The patient will be responsible for cost sharing (co-pay) of up to 20% of the service fee (after annual deductible is met).  Patient agreed to services and verbal consent obtained.   The patient verbalized understanding of instructions, educational materials, and care plan provided today and agreed to receive a mailed copy of patient instructions, educational materials, and care plan.   Jacqlyn Larsen RNC, BSN RN Case Manager [facility] [phone]  Hypoglycemia Hypoglycemia occurs when the level of sugar (glucose) in the blood is too low. Hypoglycemia can happen in people who have or do not have diabetes. It can develop quickly, and it can be a medical emergency. For most people, a blood glucose level below 70 mg/dL (3.9 mmol/L) is considered hypoglycemia. Glucose is a type of sugar that provides the body's main source of energy. Certain hormones (insulin and glucagon) control the level of glucose in the blood. Insulin lowers blood glucose, and glucagon raises blood glucose. Hypoglycemia can result from having too much insulin in the bloodstream, or from not eating enough food that  contains glucose. You may also have reactive hypoglycemia, which happens within 4 hours after eating a meal. What are the causes? Hypoglycemia occurs most often in people who have diabetes and may be caused by: Diabetes medicine. Not eating enough, or not eating often enough. Increased physical activity. Drinking alcohol on an empty stomach. If you do not have diabetes, hypoglycemia may be caused by: A tumor in the pancreas. Not eating enough, or not eating for long periods at a time (fasting). A severe infection or illness. Problems after having bariatric surgery. Organ failure, such as kidney or liver failure. Certain medicines. What increases the risk? Hypoglycemia is more likely to develop in people who: Have diabetes and take medicines to lower blood glucose. Abuse alcohol. Have a severe illness. What are the signs or symptoms? Symptoms vary depending on whether the condition is mild, moderate, or severe. Mild hypoglycemia Hunger. Sweating and feeling clammy. Dizziness or feeling light-headed. Sleepiness or restless sleep. Nausea. Increased heart rate. Headache. Blurry vision. Mood changes, such as irritability or anxiety. Tingling or numbness around the mouth, lips, or tongue. Moderate hypoglycemia Confusion and poor judgment. Behavior changes. Weakness. Irregular heartbeat. A change in coordination. Severe hypoglycemia Severe hypoglycemia is a medical emergency. It can cause: Fainting. Seizures. Loss of consciousness (coma). Death. How is this diagnosed? Hypoglycemia is diagnosed with a blood test to measure your blood glucose level.  This blood test is done while you are having symptoms. Your health care provider may also do a physical exam and review your medical history. How is this treated? This condition can be treated by immediately eating or drinking something that contains sugar with 15 grams of fast-acting carbohydrate, such as: 4 oz (120 mL) of fruit  juice. 4 oz (120 mL) of regular soda (not diet soda). Several pieces of hard candy. Check food labels to find out how many pieces to eat for 15 grams. 1 Tbsp (15 mL) of sugar or honey. 4 glucose tablets. 1 tube of glucose gel. Treating hypoglycemia if you have diabetes If you are alert and able to swallow safely, follow the 15:15 rule: Take 15 grams of a fast-acting carbohydrate. Talk with your health care provider about how much you should take. Options for getting 15 grams of fast-acting carbohydrate include: Glucose tablets (take 4 tablets). Several pieces of hard candy. Check food labels to find out how many pieces to eat for 15 grams. 4 oz (120 mL) of fruit juice. 4 oz (120 mL) of regular soda (not diet soda). 1 Tbsp (15 mL) of sugar or honey. 1 tube of glucose gel. Check your blood glucose 15 minutes after you take the carbohydrate. If the repeat blood glucose level is still at or below 70 mg/dL (3.9 mmol/L), take 15 grams of a carbohydrate again. If your blood glucose level does not increase above 70 mg/dL (3.9 mmol/L) after 3 tries, seek emergency medical care. After your blood glucose level returns to normal, eat a meal or a snack within 1 hour.  Treating severe hypoglycemia Severe hypoglycemia is when your blood glucose level is below 54 mg/dL (3 mmol/L). Severe hypoglycemia is a medical emergency. Get medical help right away. If you have severe hypoglycemia and you cannot eat or drink, you will need to be given glucagon. A family member or close friend should learn how to check your blood glucose and how to give you glucagon. Ask your health care provider if you need to have an emergency glucagon kit available. Severe hypoglycemia may need to be treated in a hospital. The treatment may include getting glucose through an IV. You may also need treatment for the cause of your hypoglycemia. Follow these instructions at home:  General instructions Take over-the-counter and  prescription medicines only as told by your health care provider. Monitor your blood glucose as told by your health care provider. If you drink alcohol: Limit how much you have to: 0-1 drink a day for women who are not pregnant. 0-2 drinks a day for men. Know how much alcohol is in your drink. In the [place] one drink equals one 12 oz bottle of beer (355 mL), one 5 oz glass of wine (148 mL), or one 1 oz glass of hard liquor (44 mL). Be sure to eat food along with drinking alcohol. Be aware that alcohol is absorbed quickly and may have lingering effects that may result in hypoglycemia later. Be sure to do ongoing glucose monitoring. Keep all follow-up visits. This is important. If you have diabetes: Always have a fast-acting carbohydrate (15 grams) option with you to treat low blood glucose. Follow your diabetes management plan as directed by your health care provider. Make sure you: Know the symptoms of hypoglycemia. It is important to treat it right away to prevent it from becoming severe. Check your blood glucose as often as told. Always check before and after exercise. Always check your blood glucose before  you drive a motorized vehicle. Take your medicines as told. Follow your meal plan. Eat on time, and do not skip meals. Share your diabetes management plan with people in your workplace, school, and household. Carry a medical alert card or wear medical alert jewelry. Where to find more information American Diabetes Association: www.diabetes.org Contact a health care provider if: You have problems keeping your blood glucose in your target range. You have frequent episodes of hypoglycemia. Get help right away if: You continue to have hypoglycemia symptoms after eating or drinking something that contains 15 grams of fast-acting carbohydrate, and you cannot get your blood glucose above 70 mg/dL (3.9 mmol/L) while following the 15:15 rule. Your blood glucose is below 54 mg/dL (3  mmol/L). You have a seizure. You faint. These symptoms may represent a serious problem that is an emergency. Do not wait to see if the symptoms will go away. Get medical help right away. Call your local emergency services (911 in the [place] Do not drive yourself to the hospital. Summary Hypoglycemia occurs when the level of sugar (glucose) in the blood is too low. Hypoglycemia can happen in people who have or do not have diabetes. It can develop quickly, and it can be a medical emergency. Make sure you know the symptoms of hypoglycemia and how to treat it. Always have a fast-acting carbohydrate option with you to treat low blood sugar. This information is not intended to replace advice given to you by your health care provider. Make sure you discuss any questions you have with your health care provider. Document Revised: 07/19/2020 Document Reviewed: 07/19/2020 Elsevier Patient Education  [place]. Heart Failure Action Plan A heart failure action plan helps you understand what to do when you have symptoms of heart failure. Your action plan is a color-coded plan that lists the symptoms to watch for and indicates what actions to take. If you have symptoms in the red zone, you need medical care right away. If you have symptoms in the yellow zone, you are having problems. If you have symptoms in the green zone, you are doing well. Follow the plan that was created by you and your health care provider. Review your plan each time you visit your health care provider. Red zone These signs and symptoms mean you should get medical help right away: You have trouble breathing when resting. You have a dry cough that is getting worse. You have swelling or pain in your legs or abdomen that is getting worse. You suddenly gain more than 2-3 lb (0.9-1.4 kg) in 24 hours, or more than 5 lb (2.3 kg) in a week. This amount may be more or less depending on your condition. You have trouble staying awake or  you feel confused. You have chest pain. You do not have an appetite. You pass out. You have worsening sadness or depression. If you have any of these symptoms, call your local emergency services (911 in the [place] right away. Do not drive yourself to the hospital. Yellow zone These signs and symptoms mean your condition may be getting worse and you should make some changes: You have trouble breathing when you are active, or you need to sleep with your head raised on extra pillows to help you breathe. You have swelling in your legs or abdomen. You gain 2-3 lb (0.9-1.4 kg) in 24 hours, or 5 lb (2.3 kg) in a week. This amount may be more or less depending on your condition. You get tired easily. You  have trouble sleeping. You have a dry cough. If you have any of these symptoms: Contact your health care provider within the next day. Your health care provider may adjust your medicines. Green zone These signs mean you are doing well and can continue what you are doing: You do not have shortness of breath. You have very little swelling or no new swelling. Your weight is stable (no gain or loss). You have a normal activity level. You do not have chest pain or any other new symptoms. Follow these instructions at home: Take over-the-counter and prescription medicines only as told by your health care provider. Weigh yourself daily. Your target weight is __________ lb (__________ kg). Call your health care provider if you gain more than __________ lb (__________ kg) in 24 hours, or more than __________ lb (__________ kg) in a week. Health care provider name: _____________________________________________________ Health care provider phone number: _____________________________________________________ Eat a heart-healthy diet. Work with a diet and nutrition specialist (dietitian) to create an eating plan that is best for you. Keep all follow-up visits. This is important. Where to find more  information American Heart Association: www.heart.org Summary A heart failure action plan helps you understand what to do when you have symptoms of heart failure. Follow the action plan that was created by you and your health care provider. Get help right away if you have any symptoms in the red zone. This information is not intended to replace advice given to you by your health care provider. Make sure you discuss any questions you have with your health care provider. Document Revised: 04/02/2020 Document Reviewed: 04/02/2020 Elsevier Patient Education  [place].

## 2022-01-23 NOTE — Telephone Encounter (Signed)
Recall sent 

## 2022-01-23 NOTE — Telephone Encounter (Signed)
Recall for ultrasound 

## 2022-01-27 ENCOUNTER — Other Ambulatory Visit: Payer: Self-pay | Admitting: Nurse Practitioner

## 2022-01-29 DIAGNOSIS — Z7984 Long term (current) use of oral hypoglycemic drugs: Secondary | ICD-10-CM | POA: Diagnosis not present

## 2022-01-29 DIAGNOSIS — I509 Heart failure, unspecified: Secondary | ICD-10-CM

## 2022-01-29 DIAGNOSIS — F17210 Nicotine dependence, cigarettes, uncomplicated: Secondary | ICD-10-CM | POA: Diagnosis not present

## 2022-01-29 DIAGNOSIS — E1159 Type 2 diabetes mellitus with other circulatory complications: Secondary | ICD-10-CM | POA: Diagnosis not present

## 2022-01-30 ENCOUNTER — Encounter: Payer: Self-pay | Admitting: Internal Medicine

## 2022-01-30 ENCOUNTER — Ambulatory Visit (INDEPENDENT_AMBULATORY_CARE_PROVIDER_SITE_OTHER): Payer: Medicare Other | Admitting: Internal Medicine

## 2022-01-30 VITALS — BP 118/64 | HR 93 | Resp 18 | Ht 65.0 in | Wt 199.2 lb

## 2022-01-30 DIAGNOSIS — E1159 Type 2 diabetes mellitus with other circulatory complications: Secondary | ICD-10-CM | POA: Diagnosis not present

## 2022-01-30 DIAGNOSIS — R197 Diarrhea, unspecified: Secondary | ICD-10-CM | POA: Diagnosis not present

## 2022-01-30 NOTE — Patient Instructions (Signed)
Please do not take Lactulose if you have watery diarrhea.  Please continue to eat at regular intervals and maintain adequate hydration by taking at least 64 ounces of fluid in a day.

## 2022-01-30 NOTE — Progress Notes (Signed)
Acute Office Visit  Subjective:    Patient ID: Randy Hebert, male    DOB: May 11, 1966, 56 y.o.   MRN: [id number]  Chief Complaint  Patient presents with   Abdominal Pain    Pt started having pain and loose stools 2 weeks ago then 1 week ago he was feeling better starting 01-27-22 started having loose stools did eat corn and has been diagnosed with cirrhosis and diverticulitis    HPI Patient is in today for complaint of loose BM and episodes of watery diarrhea for the last 2 weeks.  He denies any fever or chills.  He reports having sweet tea from soft drink dispenser 2 weeks ago, after which she started having such symptoms.  He denies any nausea or vomiting currently.  Denies any fever or chills currently.  He has chronic abdominal distention from cirrhosis, but denies any melena or hematochezia currently.  Past Medical History:  Diagnosis Date   ACE inhibitor-aggravated angioedema    Allergy    Angio-edema    Anxiety    Arthritis    Asthma    hip replacement   Back pain    Bradycardia 04/28/2017   Bulging of cervical intervertebral disc    CAD (coronary artery disease)    lateral STEMI 02/06/2015 00% D1 occlusion treated with Promus Premier 2.5 mm x 16 mm DES, 70% ramus stenosis, 40% mid RCA stenosis, 45% distal RCA stenosis, EF 45-50%   CHF (congestive heart failure) (HCC)    COPD (chronic obstructive pulmonary disease) ([place])    Depression    Diabetes mellitus without complication ([place])    Difficult intubation    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   Dry eye    Dyspnea    Early satiety 09/23/2016   GERD (gastroesophageal reflux disease)    HCAP (healthcare-associated pneumonia) 05/15/2017   Headache    Heart murmur    Hip pain    Hyperlipidemia    Hypertension    Lobar pneumonia ([place]) 05/15/2017   Melena 08/04/2018   MI (myocardial infarction) ([place])    Myocardial infarction ([place])    Neck pain    Non-ST elevation (NSTEMI) myocardial infarction ([place])  04/27/2017   NSTEMI (non-ST elevated myocardial infarction) ([place]) 04/26/2017   Otitis media    Pleurisy    Pneumonia due to COVID-19 virus 10/07/2019   Rectal bleeding 11/08/2018   Right shoulder pain 03/20/2020   Sinus pause    9 sec sinus pause on telemetry after started on coreg after MI, avoid AV nodal blocking agent   Sleep apnea    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   STEMI (ST elevation myocardial infarction) ([place]) 05/31/2015   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   Syncope 65/99/3570   Syncope and collapse 05/15/2017   Transaminitis 08/04/2018   Unilateral primary osteoarthritis, right hip 07/01/2016    Past Surgical History:  Procedure Laterality Date   BIOPSY  10/09/2016   Procedure: BIOPSY;  Surgeon: Daneil Dolin, MD;  [facility];  Service: Endoscopy;;   BIOPSY  11/28/2019   Procedure: BIOPSY;  Surgeon: Daneil Dolin, MD;  [facility];  Service: Endoscopy;;   CARDIAC CATHETERIZATION N/A 02/06/2015   Procedure: Left Heart Cath and Coronary Angiography;  Surgeon: Leonie Man, MD;  Location: [facility];  Service: Cardiovascular;  Laterality: N/A;   CARDIAC CATHETERIZATION N/A 02/06/2015   Procedure: Coronary Stent Intervention;  Surgeon: Leonie Man,  MD;  Location: [facility];  Service: Cardiovascular;  Laterality: N/A;   COLONOSCOPY WITH PROPOFOL N/A 10/09/2016   Sigmoid and descending colon diverticulosis, four 4-6 mm polyps in sigmoid, one 4 mm polyp in descending. Tubular adenomas and hyperplastic. 5 year surveillance.    COLONOSCOPY WITH PROPOFOL N/A 11/24/2016   Sigmoid and descending colon diverticulosis, four 4-6 mm polyps in sigmoid, one 4 mm polyp in descending. Tubular adenomas and hyperplastic. 5 year surveillance.    COLONOSCOPY WITH PROPOFOL N/A 10/18/2021   [place]: nonbleeding internal hemorrhoids, small and large mouth diverticula found in the sigmoid, descending, transverse colon.  A 9 mm sessile  polyp was found in the ascending colon that was removed.  4 sessile polyps found in the sigmoid, descending, transverse colon 3 to 5 mm in size, examining otherwise.  Path revealed tubular adenomas.  Repeat due in 5 years for surveillance.   CORONARY ANGIOPLASTY WITH STENT PLACEMENT  01/2015   CORONARY STENT INTERVENTION N/A 04/27/2017   Procedure: CORONARY STENT INTERVENTION;  Surgeon: Nelva Bush, MD;  Location: [facility];  Service: Cardiovascular;  Laterality: N/A;   ELECTROPHYSIOLOGY STUDY N/A 06/29/2017   Procedure: ELECTROPHYSIOLOGY STUDY;  Surgeon: Evans Lance, MD;  Location: [facility];  Service: Cardiovascular;  Laterality: N/A;   ESOPHAGOGASTRODUODENOSCOPY (EGD) WITH PROPOFOL N/A 10/09/2016   Dr. Gala Romney: LA grade a esophagitis.  Barrett's esophagus, biopsy-proven.  Small hiatal hernia.  EGD February 2019   ESOPHAGOGASTRODUODENOSCOPY (EGD) WITH PROPOFOL N/A 11/28/2019    salmon-colored esophageal mucosa (Barrett's) small hiatal hernia, portal hypertensive gastropathy, normal duodenum, 3 year surveillance   ESOPHAGOGASTRODUODENOSCOPY (EGD) WITH PROPOFOL N/A 09/06/2021   three columns of grade 1 varices in distal esophagus, no stigmata of bleeding or red wale signs. Small hiatal hernia. Mild portal gastropathy. Normal duodenum. Repeat upper endoscopy in 1 year for surveillance.   INCISION / DRAINAGE HAND / FINGER     LEFT HEART CATH AND CORONARY ANGIOGRAPHY N/A 04/27/2017   Procedure: LEFT HEART CATH AND CORONARY ANGIOGRAPHY;  Surgeon: Nelva Bush, MD;  Location: [facility];  Service: Cardiovascular;  Laterality: N/A;   LOOP RECORDER INSERTION  06/29/2017   Procedure: Loop Recorder Insertion;  Surgeon: Evans Lance, MD;  Location: [facility];  Service: Cardiovascular;;   POLYPECTOMY  11/24/2016   Procedure: POLYPECTOMY;  Surgeon: Daneil Dolin, MD;  [facility];  Service: Endoscopy;;  descending and sigmoid   POLYPECTOMY   10/18/2021   Procedure: POLYPECTOMY;  Surgeon: Eloise Harman, DO;  [facility];  Service: Endoscopy;;   TOTAL HIP ARTHROPLASTY Right 07/01/2016   TOTAL HIP ARTHROPLASTY Right 07/01/2016   Procedure: RIGHT TOTAL HIP ARTHROPLASTY ANTERIOR APPROACH;  Surgeon: Mcarthur Rossetti, MD;  Location: [facility];  Service: Orthopedics;  Laterality: Right;    Family History  Problem Relation Age of Onset   Heart attack Father    Stroke Father    Arthritis Father    Heart disease Father    Cancer Mother        ???   Arthritis Mother    Heart disease Brother 18       died in sleep   Early death Brother    Diabetes Maternal Uncle    Alzheimer's disease Maternal Grandmother     Social History   Socioeconomic History   Marital status: Divorced    Spouse name: alice   Number of children: 3   Years of education: 72  Highest education level: Not on file  Occupational History   Occupation: SSI  Tobacco Use   Smoking status: Every Day    Packs/day: 1.00    Years: 46.00    Pack years: 46.00    Types: Cigarettes   Smokeless tobacco: Former    Types: Chew   Tobacco comments:    Smokes 1 pack a day [facility]C 12/06/21  Vaping Use   Vaping Use: Former  Substance and Sexual Activity   Alcohol use: Not Currently    Comment: couple beers occ- stopped 09/2021   Drug use: No    Comment: history of drug use- marijuana, cocaine- quit 2014   Sexual activity: Yes    Partners: Female    Birth control/protection: None  Other Topics Concern   Not on file  Social History Narrative   Lives with girlfriend [name]   3 children, but 1 OD  In 2019.   Grandchildren-2      Two cats: may and princess; and a dog-foxy      Enjoy: spending time with pets, TV, yard work       Diet: eats all food groups   Caffeine: half and half coffee, some tea-half and half decaf   Water: 6-8 cups daily      Wears seat belt   Does not use phone while driving   Oceanographer at home      Social  Determinants of Health   Financial Resource Strain: Low Risk    Difficulty of Paying Living Expenses: Not hard at all  Food Insecurity: No Food Insecurity   Worried About Charity fundraiser in the Last Year: Never true   Arboriculturist in the Last Year: Never true  Transportation Needs: No Transportation Needs   Lack of Transportation (Medical): No   Lack of Transportation (Non-Medical): No  Physical Activity: Inactive   Days of Exercise per Week: 0 days   Minutes of Exercise per Session: 0 min  Stress: No Stress Concern Present   Feeling of Stress : Not at all  Social Connections: Moderately Integrated   Frequency of Communication with Friends and Family: More than three times a week   Frequency of Social Gatherings with Friends and Family: Twice a week   Attends Religious Services: More than 4 times per year   Active Member of [employer] or Organizations: Yes   Attends Music therapist: More than 4 times per year   Marital Status: Divorced  Human resources officer Violence: Not At Risk   Fear of Current or Ex-Partner: No   Emotionally Abused: No   Physically Abused: No   Sexually Abused: No    Outpatient Medications Prior to Visit  Medication Sig Dispense Refill   acetaminophen (TYLENOL) 500 MG tablet Take 2 tablets (1,000 mg total) by mouth every 8 (eight) hours as needed for mild pain or fever.     albuterol (PROVENTIL) (2.5 MG/3ML) 0.083% nebulizer solution Take 3 mLs (2.5 mg total) by nebulization every 6 (six) hours as needed for wheezing or shortness of breath. 150 mL 1   albuterol (VENTOLIN HFA) 108 (90 Base) MCG/ACT inhaler INHALE 2 PUFFS INTO THE LUNGS EVERY 6 HOURS AS NEEDEDFOR SHORTNESS OF BREATH OR WHEEZING. 18 g 1   amLODipine (NORVASC) 10 MG tablet TAKE 1 TABLET BY MOUTH ONCE A DAY. 90 tablet 0   ascorbic acid (VITAMIN C) 500 MG tablet Take 1 tablet (500 mg total) by mouth daily. 30 tablet 1   aspirin  81 MG EC tablet Take 1 tablet (81 mg total) by mouth daily.  (Patient taking differently: Take 81 mg by mouth in the morning and at bedtime.) 30 tablet    atorvastatin (LIPITOR) 40 MG tablet TAKE 1 TABLET BY MOUTH ONCE A DAY. (Patient taking differently: Take 40 mg by mouth at bedtime.) 90 tablet 1   Azelastine HCl 137 MCG/SPRAY SOLN USE 2 SPRAYS IN EACH NOSTRIL TWICE DAILY. 30 mL 0   Budeson-Glycopyrrol-Formoterol (BREZTRI AEROSPHERE) 160-9-4.8 MCG/ACT AERO Inhale 2 puffs into the lungs 2 (two) times daily. 10.7 g 0   budesonide-formoterol (SYMBICORT) 160-4.5 MCG/ACT inhaler Inhale 2 puffs into the lungs 2 (two) times daily.     Cholecalciferol (VITAMIN D3) 125 MCG (5000 UT) CAPS Take 1 capsule (5,000 Units total) by mouth daily. 90 capsule 0   citalopram (CELEXA) 20 MG tablet Take 20 mg by mouth daily.     cycloSPORINE (RESTASIS) 0.05 % ophthalmic emulsion Place 1 drop into both eyes 2 (two) times daily.     FARXIGA 10 MG TABS tablet TAKE ONE TABLET BY MOUTH ONCE DAILY. 30 tablet 11   ferrous sulfate 325 (65 FE) MG tablet Take 1 tablet (325 mg total) by mouth daily with breakfast. 90 tablet 1   furosemide (LASIX) 40 MG tablet Take 1 tablet (40 mg total) by mouth daily as needed for fluid. 30 tablet 11   glipiZIDE (GLUCOTROL XL) 5 MG 24 hr tablet TAKE 1 TABLET BY MOUTH DAILY WITH BREAKFAST. 90 tablet 0   glucose blood (ACCU-CHEK AVIVA PLUS) test strip Use as instructed to check blood glucose three times daily. 100 each 2   lactulose (CHRONULAC) 10 GM/15ML solution Take 15 mLs (10 g total) by mouth daily. 473 mL 6   magnesium gluconate (MAGONATE) 500 MG tablet Take 500 mg by mouth 2 (two) times daily.     Melatonin 10 MG CAPS Take 10 mg by mouth at bedtime.     metFORMIN (GLUCOPHAGE) 500 MG tablet Take 1 tablet (500 mg total) by mouth 2 (two) times daily with a meal. 60 tablet 2   mometasone (ELOCON) 0.1 % cream Apply 1 application topically daily as needed (eczema on ears).     nitroGLYCERIN (NITROSTAT) 0.4 MG SL tablet Place 1 tablet (0.4 mg total) under  the tongue every 5 (five) minutes as needed for chest pain. 25 tablet 3   olopatadine (PATANOL) 0.1 % ophthalmic solution Place 1 drop into both eyes 2 (two) times daily.     pantoprazole (PROTONIX) 40 MG tablet TAKE (1) TABLET BY MOUTH TWICE DAILY BEFORE A MEAL. 60 tablet 5   TRULICITY 1.5 JA/2.5KN SOPN INJECT 1.5 MG INTO THE SKIN ONCE WEEKLY. 2 mL 0   No facility-administered medications prior to visit.    Allergies  Allergen Reactions   Carvedilol Other (See Comments)    Sinus pause on telemetry >3 seconds. Longest one 9 sec. No AV nodal agent   Lisinopril Anaphylaxis, Shortness Of Breath and Swelling    Angioedema, required intubation and mechanical ventilation   Chantix [Varenicline] Other (See Comments)    Nightmares and unable to sleep well   Amoxicillin Nausea Only    Did it involve swelling of the face/tongue/throat, SOB, or low BP? No Did it involve sudden or severe rash/hives, skin peeling, or any reaction on the inside of your mouth or nose? No Did you need to seek medical attention at a hospital or doctor's office? No When did it last happen? childhood reaction  If all above answers are "NO", may proceed with cephalosporin use.     Review of Systems  Constitutional:  Negative for chills.  HENT:  Negative for congestion, sinus pressure and sinus pain.   Eyes:  Negative for pain and discharge.  Respiratory:  Positive for shortness of breath. Negative for cough and wheezing.   Gastrointestinal:  Positive for diarrhea. Negative for nausea and vomiting.  Genitourinary:  Negative for dysuria and hematuria.  Musculoskeletal:  Negative for myalgias and neck stiffness.  Skin:  Negative for rash.       Dry skin over scalp region Mole over left thigh  Neurological:  Negative for dizziness and weakness.  Psychiatric/Behavioral:  Negative for agitation and behavioral problems.       Objective:    Physical Exam Vitals reviewed.  Constitutional:      General: He is not  in acute distress.    Appearance: He is obese. He is not diaphoretic.  HENT:     Head: Normocephalic and atraumatic.     Nose: Nose normal.     Mouth/Throat:     Mouth: Mucous membranes are moist.  Eyes:     General: No scleral icterus.    Extraocular Movements: Extraocular movements intact.  Cardiovascular:     Rate and Rhythm: Normal rate and regular rhythm.     Pulses: Normal pulses.     Heart sounds: Normal heart sounds. No murmur heard. Pulmonary:     Breath sounds: No wheezing or rales.  Abdominal:     General: There is distension.     Palpations: Abdomen is soft.     Tenderness: There is no abdominal tenderness.  Musculoskeletal:     Cervical back: Neck supple. No tenderness.     Right lower leg: No edema.     Left lower leg: No edema.  Skin:    General: Skin is warm.     Comments: Nevus - brown, about 1 cm in diameter over left thigh  Neurological:     General: No focal deficit present.     Mental Status: He is alert and oriented to person, place, and time.     Sensory: Sensory deficit (4th and 5th digits of left hand) present.     Motor: No weakness.  Psychiatric:        Mood and Affect: Mood normal.        Behavior: Behavior normal.    BP 118/64 (BP Location: Right Arm, Patient Position: Sitting, Cuff Size: Normal)   Pulse 93   Resp 18   Ht 5\' 5"  (1.651 m)   Wt 199 lb 3.2 oz (90.4 kg)   SpO2 95%   BMI 33.15 kg/m  Wt Readings from Last 3 Encounters:  01/30/22 199 lb 3.2 oz (90.4 kg)  01/07/22 202 lb 3.2 oz (91.7 kg)  12/26/21 201 lb 12.8 oz (91.5 kg)        Assessment & Plan:   Problem List Items Addressed This Visit       Cardiovascular and Mediastinum   DM type 2 causing vascular disease ([place])   Relevant Orders   Microalbumin / creatinine urine ratio   Other Visit Diagnoses     Diarrhea of presumed infectious origin    -  Primary Unclear etiology currently Check stool GI profile to rule out infectious etiology He has history of liver  cirrhosis and chronic abdominal distention If persistent without infectious etiology, advised to follow-up with GI -May consider colestipol   Relevant Orders  GI Profile, Stool, PCR        No orders of the defined types were placed in this encounter.    Lindell Spar, MD

## 2022-01-31 DIAGNOSIS — R197 Diarrhea, unspecified: Secondary | ICD-10-CM | POA: Diagnosis not present

## 2022-02-01 LAB — MICROALBUMIN / CREATININE URINE RATIO
Creatinine, Urine: 151.5 mg/dL
Microalb/Creat Ratio: 3 mg/g creat (ref 0–29)
Microalbumin, Urine: 4.2 ug/mL

## 2022-02-03 LAB — GI PROFILE, STOOL, PCR

## 2022-02-12 ENCOUNTER — Encounter: Payer: Self-pay | Admitting: Family Medicine

## 2022-02-12 ENCOUNTER — Ambulatory Visit (INDEPENDENT_AMBULATORY_CARE_PROVIDER_SITE_OTHER): Payer: Medicare Other | Admitting: Family Medicine

## 2022-02-12 VITALS — BP 127/72 | HR 77 | Temp 97.7°F | Wt 196.8 lb

## 2022-02-12 DIAGNOSIS — E1159 Type 2 diabetes mellitus with other circulatory complications: Secondary | ICD-10-CM | POA: Diagnosis not present

## 2022-02-12 DIAGNOSIS — Z125 Encounter for screening for malignant neoplasm of prostate: Secondary | ICD-10-CM

## 2022-02-12 DIAGNOSIS — D500 Iron deficiency anemia secondary to blood loss (chronic): Secondary | ICD-10-CM | POA: Diagnosis not present

## 2022-02-12 DIAGNOSIS — I10 Essential (primary) hypertension: Secondary | ICD-10-CM

## 2022-02-12 DIAGNOSIS — E782 Mixed hyperlipidemia: Secondary | ICD-10-CM | POA: Diagnosis not present

## 2022-02-12 DIAGNOSIS — L729 Follicular cyst of the skin and subcutaneous tissue, unspecified: Secondary | ICD-10-CM

## 2022-02-12 NOTE — Patient Instructions (Signed)
Keep the area clean.  Keep a dressing on for the next few days. Change daily.  Follow up in 3-6 months.

## 2022-02-13 DIAGNOSIS — L729 Follicular cyst of the skin and subcutaneous tissue, unspecified: Secondary | ICD-10-CM | POA: Insufficient documentation

## 2022-02-13 NOTE — Assessment & Plan Note (Signed)
Lipid panel today to assess.  Continue Lipitor.

## 2022-02-13 NOTE — Assessment & Plan Note (Signed)
Stable.  Continue current medications.

## 2022-02-13 NOTE — Assessment & Plan Note (Signed)
Incision and drainage performed today.

## 2022-02-13 NOTE — Assessment & Plan Note (Signed)
A1c today for further assessment.  Continue current medications.

## 2022-02-13 NOTE — Assessment & Plan Note (Signed)
Patient's most recent blood work revealed anemia.  Reassessing today with CBC.

## 2022-02-13 NOTE — Progress Notes (Signed)
Subjective:  Patient ID: Randy Hebert, male    DOB: [birth year]  Age: 56 y.o. MRN: [id number]  CC: Chief Complaint  Patient presents with   Hypertension    Pt states he would like to stay with Dr.Petra Sargeant; pt states he had 4 doctors at [facility]. Pt has bump on back that has been there for a while. Itching at times.     HPI:  56 year old male with an extensive past medical history presents for follow-up.  In addition to his chronic problems, patient has an area on his back that he would like examined.  Patient's hypertension is stable on amlodipine and Lasix.  No recent lipid panels available.  Last LDL was 74.  Needs labs.  Has known cirrhosis and has a history of GI bleed.  Last labs revealed anemia.  Needs updated labs.  Last A1c was at goal; 6.8.  He remains on metformin, Trulicity, and glipizide.  Patient reports that he has a long history of a raised area on his left upper back.  He is unsure of how long this has been going on.  It has recently been bothering him quite a bit.  He has attempted to get this relieved by having his son and girlfriend drained the area.  He has been unsuccessful.  He would like me to examine this today.  Patient Active Problem List   Diagnosis Date Noted   Nevus 11/21/2021   Callus of foot 11/21/2021   History of GI bleed 11/13/2021   History of colonic polyps 09/18/2021   Insomnia 09/13/2021   Iron deficiency anemia due to chronic blood loss 04/02/2021   Seasonal and perennial allergic rhinitis 11/10/2018   Cirrhosis of liver ([place]) 11/08/2018   Barrett's esophagus 08/04/2018   Vitamin D deficiency 06/30/2018   Alcohol abuse 06/30/2018   DM type 2 causing vascular disease ([place]) 08/17/2017   Chronic combined systolic and diastolic CHF (congestive heart failure) ([place]) 05/15/2017   OSA (obstructive sleep apnea) 04/28/2017   Depression with anxiety 04/15/2017   GERD (gastroesophageal reflux disease) 09/23/2016   Asthmatic  bronchitis , chronic ([place]) 08/27/2015   Mixed hyperlipidemia 08/27/2015   Class 1 obesity due to excess calories without serious comorbidity with body mass index (BMI) of 34.0 to 34.9 in adult 08/27/2015   CAD (coronary artery disease) 05/31/2015   Cigarette smoker 05/31/2015   Essential hypertension, benign 02/09/2015    Social Hx   Social History   Socioeconomic History   Marital status: Divorced    Spouse name: alice   Number of children: 3   Years of education: 12   Highest education level: Not on file  Occupational History   Occupation: SSI  Tobacco Use   Smoking status: Every Day    Packs/day: 1.00    Years: 46.00    Total pack years: 46.00    Types: Cigarettes   Smokeless tobacco: Former    Types: Chew   Tobacco comments:    Smokes 1 pack a day MRC 12/06/21  Vaping Use   Vaping Use: Former  Substance and Sexual Activity   Alcohol use: Not Currently    Comment: couple beers occ- stopped 09/2021   Drug use: No    Comment: history of drug use- marijuana, cocaine- quit 2014   Sexual activity: Yes    Partners: Female    Birth control/protection: None  Other Topics Concern   Not on file  Social History Narrative   Lives with girlfriend [place]  3 children, but 1 OD  In 2019.   Grandchildren-2      Two cats: may and princess; and a dog-foxy      Enjoy: spending time with pets, TV, yard work       Diet: eats all food groups   Caffeine: half and half coffee, some tea-half and half decaf   Water: 6-8 cups daily      Wears seat belt   Does not use phone while driving   Smoke detectors at home      Social Determinants of Health   Financial Resource Strain: Low Risk  (10/28/2021)   Overall Financial Resource Strain (CARDIA)    Difficulty of Paying Living Expenses: Not hard at all  Food Insecurity: No Food Insecurity (01/23/2022)   Hunger Vital Sign    Worried About Running Out of Food in the Last Year: Never true    [place] in the Last Year: Never true   Transportation Needs: No Transportation Needs (01/23/2022)   PRAPARE - Hydrologist (Medical): No    Lack of Transportation (Non-Medical): No  Physical Activity: Inactive (10/28/2021)   Exercise Vital Sign    Days of Exercise per Week: 0 days    Minutes of Exercise per Session: 0 min  Stress: No Stress Concern Present (10/28/2021)   [place]    Feeling of Stress : Not at all  Social Connections: Moderately Integrated (10/28/2021)   Social Connection and Isolation Panel [NHANES]    Frequency of Communication with Friends and Family: More than three times a week    Frequency of Social Gatherings with Friends and Family: Twice a week    Attends Religious Services: More than 4 times per year    Active Member of [employer] or Organizations: Yes    Attends Music therapist: More than 4 times per year    Marital Status: Divorced    Review of Systems  Cardiovascular:  Negative for chest pain.  Skin:        Area of concern, left upper back.    Objective:  BP 127/72   Pulse 77   Temp 97.7 F (36.5 C)   Wt 196 lb 12.8 oz (89.3 kg)   SpO2 93%   BMI 32.75 kg/m      02/12/2022    2:39 PM 01/30/2022    2:56 PM 01/07/2022    2:17 PM  BP/Weight  Systolic BP 867 672 094  Diastolic BP 72 64 70  Wt. (Lbs) 196.8 199.2 202.2  BMI 32.75 kg/m2 33.15 kg/m2 33.65 kg/m2    Physical Exam Vitals and nursing note reviewed.  Constitutional:      General: He is not in acute distress.    Appearance: He is obese.  HENT:     Head: Normocephalic and atraumatic.  Eyes:     General:        Right eye: No discharge.        Left eye: No discharge.     Conjunctiva/sclera: Conjunctivae normal.  Cardiovascular:     Rate and Rhythm: Normal rate and regular rhythm.  Pulmonary:     Effort: Pulmonary effort is normal. No respiratory distress.  Abdominal:     Comments: Protuberant abdomen.  Skin:          Comments: Raised, firm area with surrounding erythema at the labeled location.  Appears consistent with a cyst.  Neurological:  Mental Status: He is alert.  Psychiatric:        Mood and Affect: Mood normal.        Behavior: Behavior normal.     Lab Results  Component Value Date   WBC 9.5 09/13/2021   HGB 10.2 (L) 09/13/2021   HCT 30.3 (L) 09/13/2021   PLT 444 09/13/2021   GLUCOSE 162 (H) 09/13/2021   CHOL 124 04/11/2021   TRIG 118 04/11/2021   HDL 28 (L) 04/11/2021   LDLCALC 74 04/11/2021   ALT 27 09/13/2021   AST 29 09/13/2021   NA 139 09/13/2021   K 5.1 09/13/2021   CL 100 09/13/2021   CREATININE 0.74 (L) 09/13/2021   BUN 9 09/13/2021   CO2 24 09/13/2021   TSH 3.61 06/21/2020   PSA 0.23 06/21/2020   INR 1.0 09/06/2021   HGBA1C 6.8 (H) 09/03/2021   MICROALBUR 1.8 11/09/2019   Procedure: Incision and drainage of skin cyst Area was cleansed with Betadine. Local anesthesia with 1% lidocaine with epinephrine. 11 blade was used to make a single incision.  Cyst contents removed with pressure and use of forceps. Minimal bleeding. Patient tolerated procedure well.  Dressing was placed on the wound.  Assessment & Plan:   Problem List Items Addressed This Visit       Cardiovascular and Mediastinum   DM type 2 causing vascular disease ([place]) - Primary   Relevant Orders   CMP14+EGFR   Hemoglobin A1c   Microalbumin / creatinine urine ratio     Other   Iron deficiency anemia due to chronic blood loss   Relevant Orders   CBC   Mixed hyperlipidemia   Relevant Orders   Lipid panel   Other Visit Diagnoses     Prostate cancer screening       Relevant Orders   PSA      Follow-up: 3-6 months.  [place]

## 2022-02-16 ENCOUNTER — Other Ambulatory Visit: Payer: Self-pay | Admitting: "Endocrinology

## 2022-02-17 DIAGNOSIS — E782 Mixed hyperlipidemia: Secondary | ICD-10-CM | POA: Diagnosis not present

## 2022-02-17 DIAGNOSIS — D500 Iron deficiency anemia secondary to blood loss (chronic): Secondary | ICD-10-CM | POA: Diagnosis not present

## 2022-02-17 DIAGNOSIS — E1159 Type 2 diabetes mellitus with other circulatory complications: Secondary | ICD-10-CM | POA: Diagnosis not present

## 2022-02-18 LAB — CMP14+EGFR
ALT: 52 IU/L — ABNORMAL HIGH (ref 0–44)
AST: 44 IU/L — ABNORMAL HIGH (ref 0–40)
Albumin/Globulin Ratio: 1.5 (ref 1.2–2.2)
Albumin: 4.3 g/dL (ref 3.8–4.9)
Alkaline Phosphatase: 144 IU/L — ABNORMAL HIGH (ref 44–121)
BUN/Creatinine Ratio: 10 (ref 9–20)
BUN: 8 mg/dL (ref 6–24)
Bilirubin Total: 0.9 mg/dL (ref 0.0–1.2)
CO2: 22 mmol/L (ref 20–29)
Calcium: 9.7 mg/dL (ref 8.7–10.2)
Chloride: 102 mmol/L (ref 96–106)
Creatinine, Ser: 0.83 mg/dL (ref 0.76–1.27)
Globulin, Total: 2.9 g/dL (ref 1.5–4.5)
Glucose: 85 mg/dL (ref 70–99)
Potassium: 4.2 mmol/L (ref 3.5–5.2)
Sodium: 139 mmol/L (ref 134–144)
Total Protein: 7.2 g/dL (ref 6.0–8.5)
eGFR: 102 mL/min/{1.73_m2} (ref >59)

## 2022-02-18 LAB — LIPID PANEL
Chol/HDL Ratio: 5.5 ratio — ABNORMAL HIGH (ref 0.0–5.0)
Cholesterol, Total: 137 mg/dL (ref 100–199)
HDL: 25 mg/dL — ABNORMAL LOW (ref >39)
LDL Chol Calc (NIH): 86 mg/dL (ref 0–99)
Triglycerides: 147 mg/dL (ref 0–149)
VLDL Cholesterol Cal: 26 mg/dL (ref 5–40)

## 2022-02-18 LAB — MICROALBUMIN / CREATININE URINE RATIO
Creatinine, Urine: 105.7 mg/dL
Microalb/Creat Ratio: 5 mg/g creat (ref 0–29)
Microalbumin, Urine: 5 ug/mL

## 2022-02-18 LAB — CBC
Hematocrit: 48.4 % (ref 37.5–51.0)
Hemoglobin: 16.1 g/dL (ref 13.0–17.7)
MCH: 27.7 pg (ref 26.6–33.0)
MCHC: 33.3 g/dL (ref 31.5–35.7)
MCV: 83 fL (ref 79–97)
Platelets: 197 10*3/uL (ref 150–450)
RBC: 5.81 x10E6/uL — ABNORMAL HIGH (ref 4.14–5.80)
RDW: 17.1 % — ABNORMAL HIGH (ref 11.6–15.4)
WBC: 6.9 10*3/uL (ref 3.4–10.8)

## 2022-02-18 LAB — HEMOGLOBIN A1C
Est. average glucose Bld gHb Est-mCnc: 140 mg/dL
Hgb A1c MFr Bld: 6.5 % — ABNORMAL HIGH (ref 4.8–5.6)

## 2022-02-18 LAB — PSA: Prostate Specific Ag, Serum: 0.5 ng/mL (ref 0.0–4.0)

## 2022-02-20 ENCOUNTER — Other Ambulatory Visit: Payer: Self-pay | Admitting: *Deleted

## 2022-02-20 ENCOUNTER — Ambulatory Visit (INDEPENDENT_AMBULATORY_CARE_PROVIDER_SITE_OTHER): Payer: Medicare Other | Admitting: "Endocrinology

## 2022-02-20 ENCOUNTER — Ambulatory Visit (INDEPENDENT_AMBULATORY_CARE_PROVIDER_SITE_OTHER): Payer: Medicare Other | Admitting: Family Medicine

## 2022-02-20 ENCOUNTER — Encounter: Payer: Self-pay | Admitting: "Endocrinology

## 2022-02-20 VITALS — BP 122/72 | HR 88 | Ht 65.0 in | Wt 197.8 lb

## 2022-02-20 VITALS — BP 118/64 | HR 79 | Temp 98.6°F | Ht 65.0 in | Wt 198.0 lb

## 2022-02-20 DIAGNOSIS — R200 Anesthesia of skin: Secondary | ICD-10-CM

## 2022-02-20 DIAGNOSIS — E1159 Type 2 diabetes mellitus with other circulatory complications: Secondary | ICD-10-CM | POA: Diagnosis not present

## 2022-02-20 DIAGNOSIS — E6609 Other obesity due to excess calories: Secondary | ICD-10-CM

## 2022-02-20 DIAGNOSIS — E782 Mixed hyperlipidemia: Secondary | ICD-10-CM | POA: Diagnosis not present

## 2022-02-20 DIAGNOSIS — M5412 Radiculopathy, cervical region: Secondary | ICD-10-CM

## 2022-02-20 DIAGNOSIS — I10 Essential (primary) hypertension: Secondary | ICD-10-CM

## 2022-02-20 DIAGNOSIS — R202 Paresthesia of skin: Secondary | ICD-10-CM | POA: Diagnosis not present

## 2022-02-20 DIAGNOSIS — F17200 Nicotine dependence, unspecified, uncomplicated: Secondary | ICD-10-CM | POA: Diagnosis not present

## 2022-02-20 DIAGNOSIS — Z6832 Body mass index (BMI) 32.0-32.9, adult: Secondary | ICD-10-CM

## 2022-02-20 MED ORDER — ACCU-CHEK GUIDE VI STRP: ORAL_STRIP | 2 refills | Status: DC

## 2022-02-20 MED ORDER — ACCU-CHEK GUIDE ME W/DEVICE KIT: 1.0000 | PACK | 0 refills | Status: DC

## 2022-02-20 MED ORDER — ATORVASTATIN CALCIUM 80 MG PO TABS: ORAL_TABLET | ORAL | 0 refills | Status: DC

## 2022-02-20 NOTE — Patient Instructions (Signed)
Xray at the hospital.  We will call with the results.  Take care  Dr. Lacinda Axon

## 2022-02-20 NOTE — Patient Instructions (Signed)

## 2022-02-21 ENCOUNTER — Ambulatory Visit (HOSPITAL_COMMUNITY)
Admission: RE | Admit: 2022-02-21 | Discharge: 2022-02-21 | Disposition: A | Discharge status: Home / Self Care | Payer: Medicare Other | Source: Ambulatory Visit | Attending: Family Medicine | Admitting: Family Medicine

## 2022-02-21 DIAGNOSIS — R202 Paresthesia of skin: Secondary | ICD-10-CM | POA: Insufficient documentation

## 2022-02-21 DIAGNOSIS — R200 Anesthesia of skin: Secondary | ICD-10-CM | POA: Diagnosis not present

## 2022-02-21 DIAGNOSIS — M542 Cervicalgia: Secondary | ICD-10-CM | POA: Diagnosis not present

## 2022-02-21 DIAGNOSIS — M5412 Radiculopathy, cervical region: Secondary | ICD-10-CM | POA: Insufficient documentation

## 2022-02-21 NOTE — Progress Notes (Signed)
Subjective:  Patient ID: Randy Hebert, male    DOB: [birth year]  Age: 56 y.o. MRN: [id number]  CC: Chief Complaint  Patient presents with   left hand and arm numbness and tingling    X 6 months , has bulging disk in neck from 2015 imaging  Experiencing weakness in arm and hand has noticed muscle is weaker and worse    Diabetes    HPI:  56 year old male with an extensive PMH (see active problems below) presents for evaluation of the above.  Patient reports ongoing numbness of the left fourth and fifth digit.  He states that this has been going on for least 6 months.  Has a history of a "bulging disc" of the neck.  He states that he is experiencing weakness in the hand.  He is dropping objects frequently.  He feels like he does not have normal strength.  He also feels like he is having atrophy of the muscles of the hand particularly at the webspace between the thumb and index finger.  No recent fall, trauma, injury.  No relieving factors.  No other complaints.  Patient Active Problem List   Diagnosis Date Noted   Cervical radiculopathy 02/21/2022   Skin cyst 02/13/2022   History of GI bleed 11/13/2021   Insomnia 09/13/2021   Seasonal and perennial allergic rhinitis 11/10/2018   Cirrhosis of liver (HCC) 11/08/2018   Barrett's esophagus 08/04/2018   Vitamin D deficiency 06/30/2018   Alcohol abuse 06/30/2018   DM type 2 causing vascular disease (HCC) 08/17/2017   Chronic combined systolic and diastolic CHF (congestive heart failure) (HCC) 05/15/2017   OSA (obstructive sleep apnea) 04/28/2017   Depression with anxiety 04/15/2017   GERD (gastroesophageal reflux disease) 09/23/2016   Asthmatic bronchitis , chronic (HCC) 08/27/2015   Mixed hyperlipidemia 08/27/2015   Class 1 obesity due to excess calories with serious comorbidity and body mass index (BMI) of 32.0 to 32.9 in adult 08/27/2015   CAD (coronary artery disease) 05/31/2015   Current smoker 05/31/2015   Essential  hypertension, benign 02/09/2015    Social Hx   Social History   Socioeconomic History   Marital status: Divorced    Spouse name: alice   Number of children: 3   Years of education: 12   Highest education level: Not on file  Occupational History   Occupation: SSI  Tobacco Use   Smoking status: Every Day    Packs/day: 1.00    Years: 46.00    Total pack years: 46.00    Types: Cigarettes   Smokeless tobacco: Former    Types: Chew   Tobacco comments:    Smokes 1 pack a day MRC 12/06/21  Vaping Use   Vaping Use: Former  Substance and Sexual Activity   Alcohol use: Not Currently    Comment: couple beers occ- stopped 09/2021   Drug use: No    Comment: history of drug use- marijuana, cocaine- quit 2014   Sexual activity: Yes    Partners: Female    Birth control/protection: None  Other Topics Concern   Not on file  Social History Narrative   Lives with girlfriend [name]   3 children, but 1 OD  In 2019.   Grandchildren-2      Two cats: may and princess; and a dog-foxy      Enjoy: spending time with pets, TV, yard work       Diet: eats all food groups   Caffeine: half and half coffee, some  tea-half and half decaf   Water: 6-8 cups daily      Wears seat belt   Does not use phone while driving   Smoke detectors at home      Social Determinants of Health   Financial Resource Strain: Low Risk  (10/28/2021)   Overall Financial Resource Strain (CARDIA)    Difficulty of Paying Living Expenses: Not hard at all  Food Insecurity: No Food Insecurity (01/23/2022)   Hunger Vital Sign    Worried About Running Out of Food in the Last Year: Never true    Ran Out of Food in the Last Year: Never true  Transportation Needs: No Transportation Needs (01/23/2022)   PRAPARE - Administrator, Civil Service (Medical): No    Lack of Transportation (Non-Medical): No  Physical Activity: Inactive (10/28/2021)   Exercise Vital Sign    Days of Exercise per Week: 0 days    Minutes of  Exercise per Session: 0 min  Stress: No Stress Concern Present (10/28/2021)   [employer] of Occupational Health - Occupational Stress Questionnaire    Feeling of Stress : Not at all  Social Connections: Moderately Integrated (10/28/2021)   Social Connection and Isolation Panel [NHANES]    Frequency of Communication with Friends and Family: More than three times a week    Frequency of Social Gatherings with Friends and Family: Twice a week    Attends Religious Services: More than 4 times per year    Active Member of [employer] or Organizations: Yes    Attends Engineer, structural: More than 4 times per year    Marital Status: Divorced    Review of Systems Per HPI  Objective:  BP 118/64   Pulse 79   Temp 98.6 F (37 C)   Ht 5\' 5"  (1.651 m)   Wt 198 lb (89.8 kg)   SpO2 95%   BMI 32.95 kg/m      02/20/2022    3:18 PM 02/20/2022    1:52 PM 02/12/2022    2:39 PM  BP/Weight  Systolic BP 118 122 127  Diastolic BP 64 72 72  Wt. (Lbs) 198 197.8 196.8  BMI 32.95 kg/m2 32.92 kg/m2 32.75 kg/m2    Physical Exam Constitutional:      General: He is not in acute distress.    Appearance: He is obese.  HENT:     Head: Normocephalic and atraumatic.  Eyes:     General:        Right eye: No discharge.        Left eye: No discharge.     Conjunctiva/sclera: Conjunctivae normal.  Cardiovascular:     Rate and Rhythm: Normal rate and regular rhythm.  Pulmonary:     Effort: Pulmonary effort is normal.     Breath sounds: Normal breath sounds. No wheezing, rhonchi or rales.  Musculoskeletal:     Comments: Slightly decreased handgrip strength, left hand.  Neurological:     Mental Status: He is alert.     Lab Results  Component Value Date   WBC 6.9 02/17/2022   HGB 16.1 02/17/2022   HCT 48.4 02/17/2022   PLT 197 02/17/2022   GLUCOSE 85 02/17/2022   CHOL 137 02/17/2022   TRIG 147 02/17/2022   HDL 25 (L) 02/17/2022   LDLCALC 86 02/17/2022   ALT 52 (H) 02/17/2022   AST  44 (H) 02/17/2022   NA 139 02/17/2022   K 4.2 02/17/2022   CL 102 02/17/2022  CREATININE 0.83 02/17/2022   BUN 8 02/17/2022   CO2 22 02/17/2022   TSH 3.61 06/21/2020   PSA 0.23 06/21/2020   INR 1.0 09/06/2021   HGBA1C 6.5 (H) 02/17/2022   MICROALBUR 1.8 11/09/2019     Assessment & Plan:   Problem List Items Addressed This Visit       Nervous and Auditory   Cervical radiculopathy - Primary    X-ray of the cervical spine obtained.  It revealed disc disease and neuroforaminal narrowing.  I believe that this is causing his symptoms.  Referring to neurosurgery.      Relevant Orders   Ambulatory referral to Neurosurgery   Other Visit Diagnoses     Numbness and tingling in left hand       Relevant Orders   DG Cervical Spine Complete (Completed)   Ambulatory referral to Neurosurgery      Everlene Other DO [facility]

## 2022-02-27 ENCOUNTER — Other Ambulatory Visit: Payer: Self-pay | Admitting: *Deleted

## 2022-03-05 ENCOUNTER — Encounter: Payer: Self-pay | Admitting: Internal Medicine

## 2022-03-05 ENCOUNTER — Ambulatory Visit (INDEPENDENT_AMBULATORY_CARE_PROVIDER_SITE_OTHER): Payer: Medicare Other | Admitting: Internal Medicine

## 2022-03-05 DIAGNOSIS — F17200 Nicotine dependence, unspecified, uncomplicated: Secondary | ICD-10-CM | POA: Diagnosis not present

## 2022-03-05 DIAGNOSIS — J449 Chronic obstructive pulmonary disease, unspecified: Secondary | ICD-10-CM

## 2022-03-05 MED ORDER — BUDESONIDE-FORMOTEROL FUMARATE 160-4.5 MCG/ACT IN AERO: INHALATION_SPRAY | RESPIRATORY_TRACT | 12 refills | Status: DC

## 2022-03-05 NOTE — Patient Instructions (Addendum)
My office will be contacting you by phone for referral to lung cancer screening program  The key is to stop smoking completely before smoking completely stops you!  Symbicort 160 up to 2 pffs every 12 hours as needed for breathing problems or cough as needed   Please schedule a follow up visit in 3 months but call sooner if needed  with all medications /inhalers/ solutions in hand so we can verify exactly what you are taking. This includes all medications from all doctors and over the counters        u

## 2022-03-05 NOTE — Progress Notes (Addendum)
Randy Hebert, male    DOB: 14-May-1966,   MRN: [id number]   Brief patient profile:  29  yowm  active smoker referred to pulmonary clinic in [place]  12/03/2021 by Dr Posey Pronto for ? 02 dep copd    Has seen [name] was on shots until 1st of 2023 did not feel they helped   Admit date: 09/02/2021 Discharge date: 09/06/2021   Discharge disposition: Home     Recommendations for Outpatient Follow-Up:    Follow-up With PCP in 1 week  Follow-up with gastroenterologist within 1 month of discharge to arrange for colonoscopy and surveillance EGD.   Discharge Diagnosis:    Principal Problem:   Upper GI bleed   Essential hypertension   Chronic systolic CHF (congestive heart failure) (HCC)   COPD (chronic obstructive pulmonary disease) (HCC)   Mixed hyperlipidemia   OSA (obstructive sleep apnea)   Hyperglycemia due to diabetes mellitus (HCC)   Acute respiratory failure with hypoxia (HCC)   Diarrhea   Cirrhosis of liver (HCC)   Influenza A   Hypoalbuminemia due to protein-calorie malnutrition (HCC)   Lactic acidosis   Elevated troponin   Obesity (BMI 30.0-34.9)   Hyperkalemia   Hyperammonemia [facility])              Hospital Course:    [name] is a 56 y.o. male with medical history significant for essential hypertension, T2DM, CAD, STEMI (2016), NSTEMI (2018), CHF, hyperlipidemia, COPD, liver cirrhosis, who presented to the hospital with pleuritic chest pain, cough, fever, nausea, vomiting, diarrhea, hematemesis and bloody stools.   He was found to have acute hypoxemic respiratory failure, influenza A infection and acute GI bleeding.  He was treated with Tamiflu, oxygen via nasal cannula, IV Protonix drip, IV octreotide drip and empiric IV antibiotics.  He had acute blood loss anemia but he did not require transfusion.  He also had hypokalemia and hypomagnesemia that improved with treatment.  He was treated with lactulose because of elevated ammonia levels.   He underwent  EGD which showed grade 1 esophageal varices, small hiatal hernia, portal hypertensive gastropathy and normal examined duodenum.  He could not be weaned off of oxygen.  However, he said he felt better and insisted on being discharged home.  He was discharged on 4 L/min oxygen via nasal cannula for chronic hypoxemic respiratory failure likely from his COPD.  He has been advised to avoid alcohol and cigarette smoking.     History of Present Illness  12/03/2021  Pulmonary/ 1st office eval/ Randy Hebert / [name] off 02 for a week prior to Randy Hebert  and on cpap per [name] / symb  Chief Complaint  Patient presents with   Consult    Patient has hx of COPD and shortness of breath with exertion. Productive cough with white/yellow sputum. Patient wears CPAP. Has not worn oxygen in about a week, only slept with it.   Dyspnea:  MMRC1 = can walk nl pace, flat grade, can't hurry or go uphills or steps s sob   Cough: ? Worse p eating  Sleep: cpap flat bed 2 pillows =baseline  SABA use: neb three times a week / hfa p ex only Rec The key is to stop smoking completely before smoking completely stops you! You do have copd but do have issues with your weight and asthma Plan A = Automatic = Always=    Symbicort 160 is 2 every 12 hours until 100% then up to 2 puffs every 12 hours  Work on inhaler technique:  Plan B = Backup (to supplement plan A, not to replace it) Only use your albuterol inhaler as a rescue medication  Plan C = Crisis (instead of Plan B but only if Plan B stops working) - only use your albuterol nebulizer if you first try Plan B a Plan D = Doctor - call me if B and C not adequate Ok to stop all 0xygen  Please schedule a follow up visit in 3 months but call sooner if needed  with all medications /inhalers/ solutions in hand   03/05/2022  f/u ov/Randy Hebert office/Randy Hebert re: AB maint on nothing, did not bring meds   Chief Complaint  Patient presents with   Follow-up    Inhaler working well and  breathing is better but having trouble with the heat.     Dyspnea:  MMRC1 = can walk nl pace, flat grade, can't hurry or go uphills or steps s sob  back I limiting  Cough: none  Sleeping: ok on side / bed is flat  SABA use: proair once or twice a week  02: none  Covid status: vax x 2  Lung cancer screening: referred back today   No obvious day to day or daytime variability or assoc excess/ purulent sputum or mucus plugs or hemoptysis or cp or chest tightness, subjective wheeze or overt sinus or hb symptoms.   Sleeping  without nocturnal  or early am exacerbation  of respiratory  c/o's or need for noct saba. Also denies any obvious fluctuation of symptoms with weather or environmental changes or other aggravating or alleviating factors except as outlined above   No unusual exposure hx or h/o childhood pna/ asthma or knowledge of premature birth.  Current Allergies, Complete Past Medical History, Past Surgical History, Family History, and Social History were reviewed in [employer] record.  ROS  The following are not active complaints unless bolded Hoarseness, sore throat, dysphagia, dental problems, itching, sneezing,  nasal congestion or discharge of excess mucus or purulent secretions, ear ache,   fever, chills, sweats, unintended wt loss or wt gain, classically pleuritic or exertional cp,  orthopnea pnd or arm/hand swelling  or leg swelling, presyncope, palpitations, abdominal pain, anorexia, nausea, vomiting, diarrhea  or change in bowel habits or change in bladder habits, change in stools or change in urine, dysuria, hematuria,  rash, arthralgias, visual complaints, headache, numbness, weakness or ataxia or problems with walking or coordination,  change in mood or  memory.        Current Meds- - NOTE:   Unable to verify as accurately reflecting what pt takes    Medication Sig   acetaminophen (TYLENOL) 500 MG tablet Take 2 tablets (1,000 mg total) by mouth every 8  (eight) hours as needed for mild pain or fever.   albuterol (PROVENTIL) (2.5 MG/3ML) 0.083% nebulizer solution Take 3 mLs (2.5 mg total) by nebulization every 6 (six) hours as needed for wheezing or shortness of breath.   albuterol (VENTOLIN HFA) 108 (90 Base) MCG/ACT inhaler INHALE 2 PUFFS INTO THE LUNGS EVERY 6 HOURS AS NEEDEDFOR SHORTNESS OF BREATH OR WHEEZING.   amLODipine (NORVASC) 10 MG tablet TAKE 1 TABLET BY MOUTH ONCE A DAY.   ascorbic acid (VITAMIN C) 500 MG tablet Take 1 tablet (500 mg total) by mouth daily.   aspirin 81 MG EC tablet Take 1 tablet (81 mg total) by mouth daily. (Patient taking differently: Take 81 mg by mouth in the morning and at bedtime.)  atorvastatin (LIPITOR) 80 MG tablet Take 1 tablet by mouth once daily   Azelastine HCl 137 MCG/SPRAY SOLN USE 2 SPRAYS IN EACH NOSTRIL TWICE DAILY.   Blood Glucose Monitoring Suppl (ACCU-CHEK GUIDE ME) w/Device KIT 1 Piece by Does not apply route as directed.   budesonide-formoterol (SYMBICORT) 160-4.5 MCG/ACT inhaler Inhale 2 puffs into the lungs 2 (two) times daily.   Cholecalciferol (VITAMIN D3) 125 MCG (5000 UT) CAPS Take 1 capsule (5,000 Units total) by mouth daily.   citalopram (CELEXA) 20 MG tablet Take 20 mg by mouth daily.   cycloSPORINE (RESTASIS) 0.05 % ophthalmic emulsion Place 1 drop into both eyes 2 (two) times daily.   FARXIGA 10 MG TABS tablet TAKE ONE TABLET BY MOUTH ONCE DAILY.   ferrous sulfate 325 (65 FE) MG tablet Take 1 tablet (325 mg total) by mouth daily with breakfast.   furosemide (LASIX) 40 MG tablet Take 1 tablet (40 mg total) by mouth daily as needed for fluid.   glipiZIDE (GLUCOTROL XL) 5 MG 24 hr tablet TAKE 1 TABLET BY MOUTH DAILY WITH BREAKFAST.   glucose blood (ACCU-CHEK GUIDE) test strip Use to test glucose 2 times daily.   lactulose (CHRONULAC) 10 GM/15ML solution Take 15 mLs (10 g total) by mouth daily.   magnesium gluconate (MAGONATE) 500 MG tablet Take 500 mg by mouth 2 (two) times daily.    Melatonin 10 MG CAPS Take 10 mg by mouth at bedtime.   metFORMIN (GLUCOPHAGE) 500 MG tablet Take 1 tablet (500 mg total) by mouth 2 (two) times daily with a meal.   mometasone (ELOCON) 0.1 % cream Apply 1 application topically daily as needed (eczema on ears).   nitroGLYCERIN (NITROSTAT) 0.4 MG SL tablet Place 1 tablet (0.4 mg total) under the tongue every 5 (five) minutes as needed for chest pain.   olopatadine (PATANOL) 0.1 % ophthalmic solution Place 1 drop into both eyes 2 (two) times daily.   pantoprazole (PROTONIX) 40 MG tablet TAKE (1) TABLET BY MOUTH TWICE DAILY BEFORE A MEAL.   TRULICITY 1.5 JO/8.4ZY SOPN INJECT 1.5 MG INTO THE SKIN ONCE WEEKLY.               Past Medical History:  Diagnosis Date   ACE inhibitor-aggravated angioedema    Allergy    Angio-edema    Anxiety    Arthritis    Asthma    hip replacement   Back pain    Bradycardia 04/28/2017   Bulging of cervical intervertebral disc    CAD (coronary artery disease)    lateral STEMI 02/06/2015 00% D1 occlusion treated with Promus Premier 2.5 mm x 16 mm DES, 70% ramus stenosis, 40% mid RCA stenosis, 45% distal RCA stenosis, EF 45-50%   CHF (congestive heart failure) (HCC)    COPD (chronic obstructive pulmonary disease) ([place])    Depression    Diabetes mellitus without complication ([place])    Difficult intubation    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   Dry eye    Dyspnea    Early satiety 09/23/2016   GERD (gastroesophageal reflux disease)    HCAP (healthcare-associated pneumonia) 05/15/2017   Headache    Heart murmur    Hip pain    Hyperlipidemia    Hypertension    Lobar pneumonia ([place]) 05/15/2017   Melena 08/04/2018   MI (myocardial infarction) [facility])    Myocardial infarction ([place])    Neck pain    Non-ST elevation (NSTEMI) myocardial infarction ([place]) 04/27/2017   NSTEMI (  non-ST elevated myocardial infarction) ([place]) 04/26/2017   Otitis media    Pleurisy    Pneumonia due to COVID-19 virus 10/07/2019    Rectal bleeding 11/08/2018   Right shoulder pain 03/20/2020   Sinus pause    9 sec sinus pause on telemetry after started on coreg after MI, avoid AV nodal blocking agent   Sleep apnea    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   STEMI (ST elevation myocardial infarction) ([place]) 05/31/2015   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   Syncope 74/73/4037   Syncope and collapse 05/15/2017   Transaminitis 08/04/2018   Unilateral primary osteoarthritis, right hip 07/01/2016      Objective:     Wt Readings from Last 3 Encounters:  03/05/22 193 lb 9.6 oz (87.8 kg)  02/20/22 198 lb (89.8 kg)  02/20/22 197 lb 12.8 oz (89.7 kg)      Vital signs reviewed  03/05/2022  - Note at rest 02 sats  96% on RA   General appearance:    amb obese wm pot belly/ bright orange shirt and suspenders   HEENT : Oropharynx  clear   Nasal turbinates moderate nonspecific edema    NECK :  without  apparent JVD/ palpable Nodes/TM    LUNGS: no acc muscle use,  Min barrel  contour chest wall with bilateral  slightly decreased bs s audible wheeze and  without cough on insp or exp maneuvers and min  Hyperresonant  to  percussion bilaterally    CV:  RRR  no s3 or murmur or increase in P2, and no edema   ABD:  soft and nontender with pos end  insp Hoover's  in the supine position.  No bruits or organomegaly appreciated   MS:  Nl gait/ ext warm without deformities Or obvious joint restrictions  calf tenderness, cyanosis or clubbing     SKIN: warm and dry without lesions    NEURO:  alert, approp, nl sensorium with  no motor or cerebellar deficits apparent.                   Assessment

## 2022-03-05 NOTE — Assessment & Plan Note (Signed)
Active smoker - PFTs 07/13/15 nl x for ERV 18% at wt 220 - 12/03/2021   Walked on RA  x  3  lap(s) =  approx 450  ft  @ mod fast pace, stopped due to end of study, min sob with lowest 02 sats 92%  - 12/03/2021  After extensive coaching inhaler device,  effectiveness =    75% (short Ti) > continue symbicort 160 up to 2 q 12 h prn   Prn symbicort 160 prn Based on two studies from [place]  378; 20 p 1865 (2018) and 380 : p2020-30 (2019) in pts with mild asthma it is reasonable to use l  symbicort 160  "prn" flare in this setting but I emphasized this was only shown with symbicort and takes advantage of the rapid onset of action but is not the same as "rescue therapy" but can be stopped once the acute symptoms have resolved and the need for rescue has been minimized (< 2 x weekly)

## 2022-03-05 NOTE — Assessment & Plan Note (Signed)
Counseled re importance of smoking cessation but did not meet time criteria for separate billing    Low-dose CT lung cancer screening is recommended for patients who are 62-57 years of age with a 20+ pack-year history of smoking and who are currently smoking or quit <=15 years ago. No coughing up blood  No unintentional weight loss of > 15 pounds in the last 6 months - pt is eligible for scanning yearly until age =80 of off cigs x 43 y, whichever comes first > referred for shared decision making         Each maintenance medication was reviewed in detail including emphasizing most importantly the difference between maintenance and prns and under what circumstances the prns are to be triggered using an action plan format where appropriate.  Total time for H and P, chart review, counseling, reviewing hfa device(s) and generating customized AVS unique to this office visit / same day charting  > 20 miin

## 2022-03-06 ENCOUNTER — Telehealth: Payer: Self-pay | Admitting: Internal Medicine

## 2022-03-06 NOTE — Telephone Encounter (Signed)
Recall for ultrasound 

## 2022-03-10 ENCOUNTER — Other Ambulatory Visit: Payer: Self-pay | Admitting: "Endocrinology

## 2022-03-11 ENCOUNTER — Telehealth: Payer: Medicare Other

## 2022-03-11 ENCOUNTER — Telehealth: Payer: Self-pay | Admitting: *Deleted

## 2022-03-11 NOTE — Telephone Encounter (Signed)
  Care Management   Follow Up Note   03/11/2022 Name: Randy Hebert MRN: [id number] DOB: Apr 09, 1966   Referred by: Coral Spikes, DO Reason for referral : Chronic Care Management (DM2, CHF)   An unsuccessful telephone outreach was attempted today. The patient was referred to the case management team for assistance with care management and care coordination.   Follow Up Plan: Telephone follow up appointment with care management team member scheduled for: upon care guide rescheduling.  Jacqlyn [facility], BSN RN Case Manager [facility] [phone]

## 2022-03-13 ENCOUNTER — Other Ambulatory Visit: Payer: Self-pay | Admitting: Internal Medicine

## 2022-03-18 ENCOUNTER — Other Ambulatory Visit: Payer: Self-pay | Admitting: "Endocrinology

## 2022-03-24 ENCOUNTER — Ambulatory Visit: Payer: Medicare Other | Admitting: Internal Medicine

## 2022-03-24 ENCOUNTER — Telehealth: Payer: Self-pay | Admitting: Internal Medicine

## 2022-03-24 DIAGNOSIS — K7030 Alcoholic cirrhosis of liver without ascites: Secondary | ICD-10-CM

## 2022-03-24 NOTE — Addendum Note (Signed)
Addended by: Cheron Every on: 03/24/2022 09:48 AM   Modules accepted: Orders

## 2022-03-24 NOTE — Telephone Encounter (Signed)
PATIENT RECEIVED LETTER TO SCHEDULE HIS ULTRASOUND

## 2022-03-24 NOTE — Telephone Encounter (Signed)
Called pt. Aware of US appt details.  

## 2022-03-25 ENCOUNTER — Encounter: Payer: Self-pay | Admitting: *Deleted

## 2022-03-25 ENCOUNTER — Ambulatory Visit (INDEPENDENT_AMBULATORY_CARE_PROVIDER_SITE_OTHER): Payer: Medicare Other | Admitting: Family Medicine

## 2022-03-25 VITALS — BP 97/80 | HR 74 | Temp 98.4°F | Ht 65.0 in | Wt 193.0 lb

## 2022-03-25 DIAGNOSIS — F418 Other specified anxiety disorders: Secondary | ICD-10-CM

## 2022-03-25 DIAGNOSIS — M5412 Radiculopathy, cervical region: Secondary | ICD-10-CM | POA: Diagnosis not present

## 2022-03-25 MED ORDER — CITALOPRAM HYDROBROMIDE 20 MG PO TABS: 20.0000 mg | ORAL_TABLET | Freq: Every day | ORAL | 3 refills | Status: DC

## 2022-03-25 NOTE — Progress Notes (Signed)
Subjective:  Patient ID: Randy Hebert, male    DOB: 1966-04-02  Age: 56 y.o. MRN: [id number]  CC: Chief Complaint  Patient presents with   Follow-up    On medication.  Patient needs another referral to [facility]. Patient quit going to psychiatry. He will need refill on Celexa.    HPI:  56 year old male with an extensive PMH including alcohol abuse and liver cirrhosis, CHF, CAD, DM-2, OSA, HLD, Depression and anxiety presents for follow up.   Patient having issues with suspected cervical radiculopathy. Has an outstanding balance with [facility] and does not have the money to pay it. Thus, he has not been seen. He would like an alternative referral.  Depression and anxiety stable. Needs refill on celexa.  Patient Active Problem List   Diagnosis Date Noted   Cervical radiculopathy 02/21/2022   History of GI bleed 11/13/2021   Insomnia 09/13/2021   Seasonal and perennial allergic rhinitis 11/10/2018   Cirrhosis of liver ([place]) 11/08/2018   Barrett's esophagus 08/04/2018   Vitamin D deficiency 06/30/2018   Alcohol abuse 06/30/2018   DM type 2 causing vascular disease ([place]) 08/17/2017   Chronic combined systolic and diastolic CHF (congestive heart failure) ([place]) 05/15/2017   OSA (obstructive sleep apnea) 04/28/2017   Depression with anxiety 04/15/2017   GERD (gastroesophageal reflux disease) 09/23/2016   Asthmatic bronchitis , chronic ([place]) 08/27/2015   Mixed hyperlipidemia 08/27/2015   Class 1 obesity due to excess calories with serious comorbidity and body mass index (BMI) of 32.0 to 32.9 in adult 08/27/2015   CAD (coronary artery disease) 05/31/2015   Current smoker 05/31/2015   Essential hypertension, benign 02/09/2015    Social Hx   Social History   Socioeconomic History   Marital status: Divorced    Spouse name: alice   Number of children: 3   Years of education: 12   Highest education level: Not on file  Occupational History    Occupation: SSI  Tobacco Use   Smoking status: Every Day    Packs/day: 1.00    Years: 46.00    Total pack years: 46.00    Types: Cigarettes   Smokeless tobacco: Former    Types: Chew   Tobacco comments:    Smokes 1 pack a day MRC 12/06/21  Vaping Use   Vaping Use: Former  Substance and Sexual Activity   Alcohol use: Not Currently    Comment: couple beers occ- stopped 09/2021   Drug use: No    Comment: history of drug use- marijuana, cocaine- quit 2014   Sexual activity: Yes    Partners: Female    Birth control/protection: None  Other Topics Concern   Not on file  Social History Narrative   Lives with girlfriend [name]   3 children, but 1 OD  In 2019.   Grandchildren-2      Two cats: may and princess; and a dog-foxy      Enjoy: spending time with pets, TV, yard work       Diet: eats all food groups   Caffeine: half and half coffee, some tea-half and half decaf   Water: 6-8 cups daily      Wears seat belt   Does not use phone while driving   Smoke detectors at home      Social Determinants of Health   Financial Resource Strain: Low Risk  (10/28/2021)   Overall Financial Resource Strain (CARDIA)    Difficulty of Paying Living Expenses: Not  hard at all  Food Insecurity: No Food Insecurity (01/23/2022)   Hunger Vital Sign    Worried About Running Out of Food in the Last Year: Never true    Ran Out of Food in the Last Year: Never true  Transportation Needs: No Transportation Needs (01/23/2022)   PRAPARE - Hydrologist (Medical): No    Lack of Transportation (Non-Medical): No  Physical Activity: Inactive (10/28/2021)   Exercise Vital Sign    Days of Exercise per Week: 0 days    Minutes of Exercise per Session: 0 min  Stress: No Stress Concern Present (10/28/2021)   [place]    Feeling of Stress : Not at all  Social Connections: Moderately Integrated (10/28/2021)   Social  Connection and Isolation Panel [NHANES]    Frequency of Communication with Friends and Family: More than three times a week    Frequency of Social Gatherings with Friends and Family: Twice a week    Attends Religious Services: More than 4 times per year    Active Member of [employer] or Organizations: Yes    Attends Music therapist: More than 4 times per year    Marital Status: Divorced    Review of Systems  Constitutional: Negative.   Neurological:        Numbness, weakness of the left hand.    Objective:  BP 97/80   Pulse 74   Temp 98.4 F (36.9 C) (Oral)   Ht 5\' 5"  (1.651 m)   Wt 193 lb (87.5 kg)   SpO2 95%   BMI 32.12 kg/m      03/25/2022   10:44 AM 03/05/2022    1:20 PM 02/20/2022    3:18 PM  BP/Weight  Systolic BP 97 053 976  Diastolic BP 80 86 64  Wt. (Lbs) 193 193.6 198  BMI 32.12 kg/m2 32.22 kg/m2 32.95 kg/m2    Physical Exam Constitutional:      General: He is not in acute distress.    Appearance: He is obese.  HENT:     Head: Normocephalic and atraumatic.  Eyes:     General:        Right eye: No discharge.        Left eye: No discharge.     Conjunctiva/sclera: Conjunctivae normal.  Cardiovascular:     Rate and Rhythm: Normal rate and regular rhythm.  Pulmonary:     Effort: Pulmonary effort is normal.     Breath sounds: No wheezing or rales.  Neurological:     Mental Status: He is alert.  Psychiatric:        Mood and Affect: Mood normal.        Behavior: Behavior normal.     Lab Results  Component Value Date   WBC 6.9 02/17/2022   HGB 16.1 02/17/2022   HCT 48.4 02/17/2022   PLT 197 02/17/2022   GLUCOSE 85 02/17/2022   CHOL 137 02/17/2022   TRIG 147 02/17/2022   HDL 25 (L) 02/17/2022   LDLCALC 86 02/17/2022   ALT 52 (H) 02/17/2022   AST 44 (H) 02/17/2022   NA 139 02/17/2022   K 4.2 02/17/2022   CL 102 02/17/2022   CREATININE 0.83 02/17/2022   BUN 8 02/17/2022   CO2 22 02/17/2022   TSH 3.61 06/21/2020   PSA 0.23 06/21/2020    INR 1.0 09/06/2021   HGBA1C 6.5 (H) 02/17/2022   MICROALBUR 1.8 11/09/2019  Assessment & Plan:   Problem List Items Addressed This Visit       Nervous and Auditory   Cervical radiculopathy - Primary    Referring to [facility].      Relevant Medications   citalopram (CELEXA) 20 MG tablet   Other Relevant Orders   Ambulatory referral to Neurosurgery     Other   Depression with anxiety    Stable. Celexa refilled.      Relevant Medications   citalopram (CELEXA) 20 MG tablet    Meds ordered this encounter  Medications   citalopram (CELEXA) 20 MG tablet    Sig: Take 1 tablet (20 mg total) by mouth daily.    Dispense:  90 tablet    Refill:  [place]

## 2022-03-25 NOTE — Assessment & Plan Note (Signed)
Referring to [facility]

## 2022-03-25 NOTE — Assessment & Plan Note (Signed)
Stable. Celexa refilled.

## 2022-03-25 NOTE — Patient Instructions (Addendum)
If you have not heard anything in 2 weeks, please let us know.  Take care  Dr. Lacinda Axon

## 2022-03-25 NOTE — Chronic Care Management (AMB) (Signed)
  Chronic Care Management Note  03/25/2022 Name: Randy Hebert MRN: [id number] DOB: [birth year]  Randy Hebert is a 56 y.o. year old male who is a primary care patient of Coral Spikes, DO and is actively engaged with the care management team. I reached out to [name] by phone today to assist with re-scheduling a follow up visit with the RN Case Manager  Follow up plan: Unsuccessful telephone outreach attempt made. A HIPAA compliant phone message was left for the patient providing contact information and requesting a return call.  The care management team will reach out to the patient again over the next 7 days.  If patient returns call to provider office, please advise to call [place] at [phone].  [place]  Direct Dial: [phone]

## 2022-03-31 ENCOUNTER — Ambulatory Visit (HOSPITAL_COMMUNITY)
Admission: RE | Admit: 2022-03-31 | Discharge: 2022-03-31 | Disposition: A | Discharge status: Home / Self Care | Payer: Medicare Other | Source: Ambulatory Visit | Attending: Gastroenterology | Admitting: Gastroenterology

## 2022-03-31 DIAGNOSIS — K7030 Alcoholic cirrhosis of liver without ascites: Secondary | ICD-10-CM | POA: Diagnosis not present

## 2022-03-31 DIAGNOSIS — K7460 Unspecified cirrhosis of liver: Secondary | ICD-10-CM | POA: Diagnosis not present

## 2022-04-02 ENCOUNTER — Telehealth: Payer: Self-pay | Admitting: *Deleted

## 2022-04-02 NOTE — Chronic Care Management (AMB) (Signed)
  Chronic Care Management Note  04/02/2022 Name: LORANCE PICKERAL MRN: [id number] DOB: 1966-04-11  Satira Anis Niess is a 56 y.o. year old male who is a primary care patient of Coral Spikes, DO and is actively engaged with the care management team. I reached out to [name] by phone today to assist with re-scheduling a follow up visit with the RN Case Manager  Follow up plan: Unsuccessful telephone outreach attempt made. A HIPAA compliant phone message was left for the patient providing contact information and requesting a return call.  Unable to make contact on outreach attempts x 2. PCP Dr. Lacinda Axon notified via routed documentation in medical record.  We have been unable to make contact with the patient for follow up. The care management team is available to follow up with the patient after provider conversation with the patient regarding recommendation for care management engagement and subsequent re-referral to the care management team.   [place]  Direct Dial: [phone]

## 2022-04-02 NOTE — Chronic Care Management (AMB) (Signed)
  Chronic Care Management Note  03/25/22               Name: Randy Hebert MRN: [id number] DOB: [birth year]  [name] is a 56 y.o. year old male who is a primary care patient of Coral Spikes, DO and is actively engaged with the care management team. I reached out to [name] by phone today to assist with re-scheduling a follow up visit with the RN Case Manager  Follow up plan: Unsuccessful telephone outreach attempt made. A HIPAA compliant phone message was left for the patient providing contact information and requesting a return call.  The care management team will reach out to the patient again over the next 7 days.  If patient returns call to provider office, please advise to call [place]  at [phone].  [place]  Direct Dial: [phone]

## 2022-04-02 NOTE — Chronic Care Management (AMB) (Deleted)
{  CCMCARECOORDINATIONCGNOTEHEADER:27436} Note  04/02/2022 Name: Randy Hebert MRN: [id number] DOB: 01-23-1966  [name] is a 56 y.o. year old male who is a primary care patient of Coral Spikes, DO and is actively engaged with the care management team. I reached out to [name] by phone today to assist with {CCMSCHEDULERESCHEDULE:23441} {CCMINITIALFOLLOWUP:23588} with the {CCM DISCIPLINE CHOICES:22236}  Follow up plan: {CCM CG FOLLOW UP YYQM:25003}  SIGNATURE

## 2022-04-03 ENCOUNTER — Ambulatory Visit: Payer: Self-pay | Admitting: *Deleted

## 2022-04-03 DIAGNOSIS — E1159 Type 2 diabetes mellitus with other circulatory complications: Secondary | ICD-10-CM

## 2022-04-03 DIAGNOSIS — I10 Essential (primary) hypertension: Secondary | ICD-10-CM

## 2022-04-03 NOTE — Chronic Care Management (AMB) (Signed)
   04/03/2022  Randy Hebert 1965/09/08 [id number]   Message received from care guide- unable to successfully outreach pt, unable to maintain contact, care plan updated and resolved.  Case closed  Jacqlyn Larsen [facility], BSN RN Case Manager [facility] [phone]

## 2022-04-08 ENCOUNTER — Telehealth: Payer: Self-pay

## 2022-04-08 NOTE — Telephone Encounter (Signed)
Pt LMOVM wanting his results to his [place] from 03/31/2022. I can relay results once read

## 2022-04-16 ENCOUNTER — Other Ambulatory Visit: Payer: Self-pay | Admitting: "Endocrinology

## 2022-04-16 DIAGNOSIS — E1159 Type 2 diabetes mellitus with other circulatory complications: Secondary | ICD-10-CM

## 2022-04-16 NOTE — Telephone Encounter (Signed)
Pt's last office visit note did not list him on metformin. States for him to continue taking farxiga, glipizide and trulicity. Wanted to clarify whether to refill metformin.

## 2022-04-21 DIAGNOSIS — G992 Myelopathy in diseases classified elsewhere: Secondary | ICD-10-CM | POA: Diagnosis not present

## 2022-04-21 DIAGNOSIS — M5412 Radiculopathy, cervical region: Secondary | ICD-10-CM | POA: Diagnosis not present

## 2022-04-21 DIAGNOSIS — M4802 Spinal stenosis, cervical region: Secondary | ICD-10-CM | POA: Diagnosis not present

## 2022-04-21 DIAGNOSIS — M62542 Muscle wasting and atrophy, not elsewhere classified, left hand: Secondary | ICD-10-CM | POA: Diagnosis not present

## 2022-04-21 DIAGNOSIS — R29898 Other symptoms and signs involving the musculoskeletal system: Secondary | ICD-10-CM | POA: Diagnosis not present

## 2022-04-23 ENCOUNTER — Telehealth: Payer: Self-pay

## 2022-04-23 NOTE — Telephone Encounter (Signed)
Reminder letter mailed to home for scheduling LDCT

## 2022-04-23 NOTE — Telephone Encounter (Signed)
Left VM and call back info to call to schedule annual LDCT.   SDMV 11/15/20 - Last LDCT May 2022

## 2022-04-25 ENCOUNTER — Other Ambulatory Visit: Payer: Self-pay | Admitting: *Deleted

## 2022-04-25 DIAGNOSIS — Z87891 Personal history of nicotine dependence: Secondary | ICD-10-CM

## 2022-04-25 DIAGNOSIS — Z122 Encounter for screening for malignant neoplasm of respiratory organs: Secondary | ICD-10-CM

## 2022-04-25 DIAGNOSIS — F17210 Nicotine dependence, cigarettes, uncomplicated: Secondary | ICD-10-CM

## 2022-04-30 ENCOUNTER — Other Ambulatory Visit: Payer: Self-pay | Admitting: Nurse Practitioner

## 2022-05-01 ENCOUNTER — Other Ambulatory Visit: Payer: Self-pay | Admitting: Internal Medicine

## 2022-05-01 ENCOUNTER — Other Ambulatory Visit: Payer: Self-pay | Admitting: Family Medicine

## 2022-05-03 DIAGNOSIS — M4722 Other spondylosis with radiculopathy, cervical region: Secondary | ICD-10-CM | POA: Diagnosis not present

## 2022-05-03 DIAGNOSIS — M4802 Spinal stenosis, cervical region: Secondary | ICD-10-CM | POA: Diagnosis not present

## 2022-05-19 ENCOUNTER — Ambulatory Visit (HOSPITAL_COMMUNITY)
Admission: RE | Admit: 2022-05-19 | Discharge: 2022-05-19 | Disposition: A | Discharge status: Home / Self Care | Payer: Medicare Other | Source: Ambulatory Visit | Attending: Family Medicine | Admitting: Family Medicine

## 2022-05-19 ENCOUNTER — Ambulatory Visit (INDEPENDENT_AMBULATORY_CARE_PROVIDER_SITE_OTHER): Payer: Medicare Other | Admitting: Family Medicine

## 2022-05-19 ENCOUNTER — Ambulatory Visit: Payer: Medicare Other | Admitting: Family Medicine

## 2022-05-19 ENCOUNTER — Encounter: Payer: Self-pay | Admitting: Family Medicine

## 2022-05-19 VITALS — BP 132/74 | HR 74 | Temp 97.9°F | Wt 197.8 lb

## 2022-05-19 DIAGNOSIS — M79642 Pain in left hand: Secondary | ICD-10-CM

## 2022-05-19 DIAGNOSIS — M19041 Primary osteoarthritis, right hand: Secondary | ICD-10-CM | POA: Diagnosis not present

## 2022-05-19 DIAGNOSIS — M19042 Primary osteoarthritis, left hand: Secondary | ICD-10-CM | POA: Diagnosis not present

## 2022-05-19 DIAGNOSIS — M79641 Pain in right hand: Secondary | ICD-10-CM | POA: Insufficient documentation

## 2022-05-19 DIAGNOSIS — M7989 Other specified soft tissue disorders: Secondary | ICD-10-CM | POA: Diagnosis not present

## 2022-05-19 MED ORDER — TRAMADOL HCL 50 MG PO TABS: 50.0000 mg | ORAL_TABLET | Freq: Three times a day (TID) | ORAL | 0 refills | Status: DC | PRN

## 2022-05-19 NOTE — Assessment & Plan Note (Addendum)
Chronic, worsening. Awaiting Xrays. Advised against NSAID's given cardiac disease and history of GI bleed. Tramadol as needed for pain.

## 2022-05-19 NOTE — Patient Instructions (Signed)
Medication as prescribed.  Xrays today.  Take care  Dr. Lacinda Axon

## 2022-05-19 NOTE — Progress Notes (Signed)
Subjective:  Patient ID: Randy Hebert, male    DOB: 1966-07-16  Age: 56 y.o. MRN: [id number]  CC: Chief Complaint  Patient presents with   Hand Pain    Pt arrives with numbness of left hand. Pt states no feeling in left hand and is unable to separate fingers. Pt states he is also dropping things. Pt states he has taking Naproxen and it helped.     HPI:  56 year old male with an extensive PMH (see below) presents for evaluation of the above.  Hand pain and swelling is worsening. Has underlying OA based on physical exam findings. Following with neurosurgery currently. Patient reports worsening pain and stiffness of the hands/digits. Has difficulty doing tasks, grabbing objects, squeezing objects. He would like to discuss use of NSAID's. He is currently taking tylenol.   Patient Active Problem List   Diagnosis Date Noted   Osteoarthritis of both hands 05/19/2022   Cervical radiculopathy 02/21/2022   History of GI bleed 11/13/2021   Insomnia 09/13/2021   Seasonal and perennial allergic rhinitis 11/10/2018   Cirrhosis of liver ([place]) 11/08/2018   Barrett's esophagus 08/04/2018   Vitamin D deficiency 06/30/2018   Alcohol abuse 06/30/2018   DM type 2 causing vascular disease ([place]) 08/17/2017   Chronic combined systolic and diastolic CHF (congestive heart failure) ([place]) 05/15/2017   OSA (obstructive sleep apnea) 04/28/2017   Depression with anxiety 04/15/2017   GERD (gastroesophageal reflux disease) 09/23/2016   Asthmatic bronchitis , chronic ([place]) 08/27/2015   Mixed hyperlipidemia 08/27/2015   Class 1 obesity due to excess calories with serious comorbidity and body mass index (BMI) of 32.0 to 32.9 in adult 08/27/2015   CAD (coronary artery disease) 05/31/2015   Current smoker 05/31/2015   Essential hypertension, benign 02/09/2015    Social Hx   Social History   Socioeconomic History   Marital status: Divorced    Spouse name: alice   Number of children: 3   Years of  education: 12   Highest education level: Not on file  Occupational History   Occupation: SSI  Tobacco Use   Smoking status: Every Day    Packs/day: 1.00    Years: 46.00    Total pack years: 46.00    Types: Cigarettes   Smokeless tobacco: Former    Types: Chew   Tobacco comments:    Smokes 1 pack a day MRC 12/06/21  Vaping Use   Vaping Use: Former  Substance and Sexual Activity   Alcohol use: Not Currently    Comment: couple beers occ- stopped 09/2021   Drug use: No    Comment: history of drug use- marijuana, cocaine- quit 2014   Sexual activity: Yes    Partners: Female    Birth control/protection: None  Other Topics Concern   Not on file  Social History Narrative   Lives with girlfriend [name]   3 children, but 1 OD  In 2019.   Grandchildren-2      Two cats: may and princess; and a dog-foxy      Enjoy: spending time with pets, TV, yard work       Diet: eats all food groups   Caffeine: half and half coffee, some tea-half and half decaf   Water: 6-8 cups daily      Wears seat belt   Does not use phone while driving   Smoke detectors at home      Social Determinants of Health   Financial Resource Strain: Low Risk  (10/28/2021)  Overall Financial Resource Strain (CARDIA)    Difficulty of Paying Living Expenses: Not hard at all  Food Insecurity: No Food Insecurity (01/23/2022)   Hunger Vital Sign    Worried About Running Out of Food in the Last Year: Never true    Ran Out of Food in the Last Year: Never true  Transportation Needs: No Transportation Needs (01/23/2022)   PRAPARE - Hydrologist (Medical): No    Lack of Transportation (Non-Medical): No  Physical Activity: Inactive (10/28/2021)   Exercise Vital Sign    Days of Exercise per Week: 0 days    Minutes of Exercise per Session: 0 min  Stress: No Stress Concern Present (10/28/2021)   [place]    Feeling of Stress : Not  at all  Social Connections: Moderately Integrated (10/28/2021)   Social Connection and Isolation Panel [NHANES]    Frequency of Communication with Friends and Family: More than three times a week    Frequency of Social Gatherings with Friends and Family: Twice a week    Attends Religious Services: More than 4 times per year    Active Member of [employer] or Organizations: Yes    Attends Music therapist: More than 4 times per year    Marital Status: Divorced    Review of Systems Per HPI  Objective:  BP 132/74   Pulse 74   Temp 97.9 F (36.6 C)   Wt 197 lb 12.8 oz (89.7 kg)   SpO2 93%   BMI 32.92 kg/m      05/19/2022   11:01 AM 03/25/2022   10:44 AM 03/05/2022    1:20 PM  BP/Weight  Systolic BP 800 97 349  Diastolic BP 74 80 86  Wt. (Lbs) 197.8 193 193.6  BMI 32.92 kg/m2 32.12 kg/m2 32.22 kg/m2    Physical Exam Constitutional:      General: He is not in acute distress.    Appearance: Normal appearance. He is obese.  HENT:     Head: Normocephalic and atraumatic.  Musculoskeletal:     Comments: Right and left hands - swelling of multiple joints noted. Heberden's nodes noted.  Neurological:     Mental Status: He is alert.  Psychiatric:        Mood and Affect: Mood normal.        Behavior: Behavior normal.    Lab Results  Component Value Date   WBC 6.9 02/17/2022   HGB 16.1 02/17/2022   HCT 48.4 02/17/2022   PLT 197 02/17/2022   GLUCOSE 85 02/17/2022   CHOL 137 02/17/2022   TRIG 147 02/17/2022   HDL 25 (L) 02/17/2022   LDLCALC 86 02/17/2022   ALT 52 (H) 02/17/2022   AST 44 (H) 02/17/2022   NA 139 02/17/2022   K 4.2 02/17/2022   CL 102 02/17/2022   CREATININE 0.83 02/17/2022   BUN 8 02/17/2022   CO2 22 02/17/2022   TSH 3.61 06/21/2020   PSA 0.23 06/21/2020   INR 1.0 09/06/2021   HGBA1C 6.5 (H) 02/17/2022   MICROALBUR 1.8 11/09/2019     Assessment & Plan:   Problem List Items Addressed This Visit       Musculoskeletal and Integument    Osteoarthritis of both hands - Primary    Chronic, worsening. Awaiting Xrays. Advised against NSAID's given cardiac disease and history of GI bleed. Tramadol as needed for pain.      Relevant Medications  traMADol (ULTRAM) 50 MG tablet   Other Visit Diagnoses     Bilateral hand pain       Relevant Orders   DG Hand Complete Left   DG Hand Complete Right       Meds ordered this encounter  Medications   traMADol (ULTRAM) 50 MG tablet    Sig: Take 1 tablet (50 mg total) by mouth every 8 (eight) hours as needed for moderate pain or severe pain.    Dispense:  15 tablet    Refill:  [place]

## 2022-05-21 DIAGNOSIS — M4712 Other spondylosis with myelopathy, cervical region: Secondary | ICD-10-CM | POA: Diagnosis not present

## 2022-05-21 DIAGNOSIS — J383 Other diseases of vocal cords: Secondary | ICD-10-CM | POA: Diagnosis not present

## 2022-05-21 DIAGNOSIS — M5030 Other cervical disc degeneration, unspecified cervical region: Secondary | ICD-10-CM | POA: Insufficient documentation

## 2022-05-21 DIAGNOSIS — M4802 Spinal stenosis, cervical region: Secondary | ICD-10-CM | POA: Insufficient documentation

## 2022-05-21 DIAGNOSIS — M4722 Other spondylosis with radiculopathy, cervical region: Secondary | ICD-10-CM | POA: Diagnosis not present

## 2022-05-21 DIAGNOSIS — M5000 Cervical disc disorder with myelopathy, unspecified cervical region: Secondary | ICD-10-CM | POA: Diagnosis not present

## 2022-05-21 DIAGNOSIS — M40202 Unspecified kyphosis, cervical region: Secondary | ICD-10-CM | POA: Insufficient documentation

## 2022-05-21 DIAGNOSIS — M6281 Muscle weakness (generalized): Secondary | ICD-10-CM | POA: Diagnosis not present

## 2022-05-21 DIAGNOSIS — F17210 Nicotine dependence, cigarettes, uncomplicated: Secondary | ICD-10-CM | POA: Diagnosis not present

## 2022-05-21 DIAGNOSIS — R2689 Other abnormalities of gait and mobility: Secondary | ICD-10-CM | POA: Diagnosis not present

## 2022-05-21 DIAGNOSIS — M4012 Other secondary kyphosis, cervical region: Secondary | ICD-10-CM | POA: Diagnosis not present

## 2022-05-21 DIAGNOSIS — M5010 Cervical disc disorder with radiculopathy, unspecified cervical region: Secondary | ICD-10-CM | POA: Diagnosis not present

## 2022-05-27 ENCOUNTER — Other Ambulatory Visit: Payer: Self-pay | Admitting: Family Medicine

## 2022-05-29 ENCOUNTER — Ambulatory Visit (HOSPITAL_COMMUNITY): Payer: Medicare Other

## 2022-06-11 ENCOUNTER — Ambulatory Visit: Payer: Medicare Other | Admitting: Internal Medicine

## 2022-06-13 ENCOUNTER — Ambulatory Visit (INDEPENDENT_AMBULATORY_CARE_PROVIDER_SITE_OTHER): Payer: Medicare Other | Admitting: Internal Medicine

## 2022-06-13 ENCOUNTER — Encounter: Payer: Self-pay | Admitting: Internal Medicine

## 2022-06-13 VITALS — BP 128/88 | HR 76 | Temp 98.2°F | Ht 65.0 in | Wt 200.8 lb

## 2022-06-13 DIAGNOSIS — Z23 Encounter for immunization: Secondary | ICD-10-CM | POA: Diagnosis not present

## 2022-06-13 DIAGNOSIS — J4489 Other specified chronic obstructive pulmonary disease: Secondary | ICD-10-CM

## 2022-06-13 DIAGNOSIS — F17210 Nicotine dependence, cigarettes, uncomplicated: Secondary | ICD-10-CM | POA: Diagnosis not present

## 2022-06-13 DIAGNOSIS — F17200 Nicotine dependence, unspecified, uncomplicated: Secondary | ICD-10-CM

## 2022-06-13 DIAGNOSIS — R569 Unspecified convulsions: Secondary | ICD-10-CM | POA: Diagnosis not present

## 2022-06-13 DIAGNOSIS — G5601 Carpal tunnel syndrome, right upper limb: Secondary | ICD-10-CM | POA: Diagnosis not present

## 2022-06-13 DIAGNOSIS — I10 Essential (primary) hypertension: Secondary | ICD-10-CM | POA: Diagnosis not present

## 2022-06-13 DIAGNOSIS — G4733 Obstructive sleep apnea (adult) (pediatric): Secondary | ICD-10-CM

## 2022-06-13 MED ORDER — NICOTINE 14 MG/24HR TD PT24: 14.0000 mg | MEDICATED_PATCH | Freq: Every day | TRANSDERMAL | 2 refills | Status: DC

## 2022-06-13 MED ORDER — NICOTINE 7 MG/24HR TD PT24: 14.0000 mg | MEDICATED_PATCH | Freq: Every day | TRANSDERMAL | Status: DC

## 2022-06-13 NOTE — Assessment & Plan Note (Signed)
Active smoker - PFTs 07/13/15 nl x for ERV 18% at wt 220 - 12/03/2021   Walked on RA  x  3  lap(s) =  approx 450  ft  @ mod fast pace, stopped due to end of study, min sob with lowest 02 sats 92%  - 12/03/2021  After extensive coaching inhaler device,  effectiveness =    75% (short Ti) > continue symbicort 160 up to 2 q 12 h prn   - The proper method of use, as well as anticipated side effects, of a metered-dose inhaler were discussed and demonstrated to the patient using teach back method. Advised using empty symbicort device to practice inhalations   Needs pfts but should be able to clear for neck surgery based on present exam and prior studies- main concern is ongoing smoking plus effects of obesity on post op lung function if not mobilized effectively so would likely need bipap bridge when in bed before resuming cpap as at home

## 2022-06-13 NOTE — Assessment & Plan Note (Addendum)
Post op from neck surgery Will  likely need bipap bridge when in bed before resuming cpap as at home           Each maintenance medication was reviewed in detail including emphasizing most importantly the difference between maintenance and prns and under what circumstances the prns are to be triggered using an action plan format where appropriate.  Total time for H and P, chart review, counseling, reviewing hfa/neb/cpap device(s) and generating customized AVS unique to this office visit / same day charting  > 30 min preop eval.

## 2022-06-13 NOTE — Patient Instructions (Addendum)
Think of your albuterol like starter fluid and always use the inhaler before resorting to the nebulizer for any purpose  Ok to try albuterol 15 min before an activity (on alternating days between inhaler / the nebulizer and nothing)   that you know would usually make you short of breath and see if it makes any difference and if makes none then don't take albuterol after activity unless you can't catch your breath as this means it's the resting that helps, not the albuterol.  The key is to stop smoking completely before smoking completely stops you!  Nicotine patch rec but read the instructions on the box   For smoking cessation classes call [phone]   My office will be contacting you by phone for referral for PFTs next available   - if you don't hear back from my office within one week please call us back or notify us thru MyChart and we'll address it right away.     Please schedule a follow up visit in 6  months but call sooner if needed

## 2022-06-13 NOTE — Assessment & Plan Note (Signed)
4-5 min discussion re active cigarette smoking in addition to office E&M  Ask about tobacco use:   ongoing Advise quitting   Needs to stop completey a minimum of 2 weeks pre op Assess willingness:  Seems  committed at this point as really wants/needs neck surgery Assist in quit attempt:  Nicotine patch 14 mg but note his urine test will still be positive  Arrange follow up:   Follow up per Primary Care planned  For smoking cessation classes call [phone]

## 2022-06-13 NOTE — Progress Notes (Signed)
Randy Hebert, male    DOB: 14-May-1966,   MRN: [id number]   Brief patient profile:  29  yowm  active smoker referred to pulmonary clinic in [place]  12/03/2021 by Dr Randy Hebert for ? 02 dep copd    Has seen Randy Hebert was on shots until 1st of 2023 did not feel they helped   Admit date: 09/02/2021 Discharge date: 09/06/2021   Discharge disposition: Home     Recommendations for Outpatient Follow-Up:    Follow-up With PCP in 1 week  Follow-up with gastroenterologist within 1 month of discharge to arrange for colonoscopy and surveillance EGD.   Discharge Diagnosis:    Principal Problem:   Upper GI bleed   Essential hypertension   Chronic systolic CHF (congestive heart failure) (HCC)   COPD (chronic obstructive pulmonary disease) (HCC)   Mixed hyperlipidemia   OSA (obstructive sleep apnea)   Hyperglycemia due to diabetes mellitus (HCC)   Acute respiratory failure with hypoxia (HCC)   Diarrhea   Cirrhosis of liver (HCC)   Influenza A   Hypoalbuminemia due to protein-calorie malnutrition (HCC)   Lactic acidosis   Elevated troponin   Obesity (BMI 30.0-34.9)   Hyperkalemia   Hyperammonemia [facility])              Hospital Course:    [name] is a 56 y.o. male with medical history significant for essential hypertension, T2DM, CAD, STEMI (2016), NSTEMI (2018), CHF, hyperlipidemia, COPD, liver cirrhosis, who presented to the hospital with pleuritic chest pain, cough, fever, nausea, vomiting, diarrhea, hematemesis and bloody stools.   He was found to have acute hypoxemic respiratory failure, influenza A infection and acute GI bleeding.  He was treated with Tamiflu, oxygen via nasal cannula, IV Protonix drip, IV octreotide drip and empiric IV antibiotics.  He had acute blood loss anemia but he did not require transfusion.  He also had hypokalemia and hypomagnesemia that improved with treatment.  He was treated with lactulose because of elevated ammonia levels.   He underwent  EGD which showed grade 1 esophageal varices, small hiatal hernia, portal hypertensive gastropathy and normal examined duodenum.  He could not be weaned off of oxygen.  However, he said he felt better and insisted on being discharged home.  He was discharged on 4 L/min oxygen via nasal cannula for chronic hypoxemic respiratory failure likely from his COPD.  He has been advised to avoid alcohol and cigarette smoking.     History of Present Illness  12/03/2021  Pulmonary/ 1st office eval/ Randy Hebert / Randy Hebert off 02 for a week prior to Randy Hebert  and on cpap per Randy Hebert / symb  Chief Complaint  Patient presents with   Consult    Patient has hx of COPD and shortness of breath with exertion. Productive cough with white/yellow sputum. Patient wears CPAP. Has not worn oxygen in about a week, only slept with it.   Dyspnea:  MMRC1 = can walk nl pace, flat grade, can't hurry or go uphills or steps s sob   Cough: ? Worse p eating  Sleep: cpap flat bed 2 pillows =baseline  SABA use: neb three times a week / hfa p ex only Rec The key is to stop smoking completely before smoking completely stops you! You do have copd but do have issues with your weight and asthma Plan A = Automatic = Always=    Symbicort 160 is 2 every 12 hours until 100% then up to 2 puffs every 12 hours  Work on inhaler technique:  Plan B = Backup (to supplement plan A, not to replace it) Only use your albuterol inhaler as a rescue medication  Plan C = Crisis (instead of Plan B but only if Plan B stops working) - only use your albuterol nebulizer if you first try Plan B a Plan D = Doctor - call me if B and C not adequate Ok to stop all 0xygen Please schedule a follow up visit in 3 months but call sooner if needed  with all medications /inhalers/ solutions in hand     03/05/2022  f/u ov/Randy Hebert office/Randy Hebert, did not bring meds   Chief Complaint  Patient presents with   Follow-up    Inhaler working well and  breathing is better but having trouble with the heat.     Dyspnea:  MMRC1 = can walk nl pace, flat grade, can't hurry or go uphills or steps s sob  back I limiting  Cough: none  Sleeping: ok on side / bed is flat  SABA use: proair once or twice a week  02: none  Covid status: vax x 2  Lung cancer screening: referred back today Rec The key is to stop smoking completely before smoking completely stops you! Symbicort 160 up to 2 pffs every 12 hours as needed for breathing problems or cough as needed Please schedule a follow up visit in 3 months but call sooner if needed  with all medications /inhalers/ solutions in hand     06/13/2022  f/u ov/[place] office/Randy Hebert re: AB maint on symbiocrt 160 / did not bring   Chief Complaint  Patient presents with   Follow-up    Breathing doing better  Wants to discuss nicotine patches to help him stop smoking   Dyspnea:  weed eating /steps more than one flight but not fast waling slow  Cough: no but lots of nasal congestion white mucus  Sleeping: bed is flat/ flat bed/ cpap  SABA use: only p ex  02: none  Lung cancer screening: due 06/25/22    No obvious day to day or daytime variability or assoc excess/ purulent sputum or mucus plugs or hemoptysis or cp or chest tightness, subjective wheeze or overt sinus or hb symptoms.   Sleeping  without nocturnal  or early am exacerbation  of respiratory  c/o's or need for noct saba. Also denies any obvious fluctuation of symptoms with weather or environmental changes or other aggravating or alleviating factors except as outlined above   No unusual exposure hx or h/o childhood pna/ asthma or knowledge of premature birth.  Current Allergies, Complete Past Medical History, Past Surgical History, Family History, and Social History were reviewed in [employer] record.  ROS  The following are not active complaints unless bolded Hoarseness, sore throat, dysphagia, dental problems,  itching, sneezing,  nasal congestion or discharge of excess mucus or purulent secretions, ear ache,   fever, chills, sweats, unintended wt loss or wt gain, classically pleuritic or exertional cp,  orthopnea pnd or arm/hand swelling  or leg swelling, presyncope, palpitations, abdominal pain, anorexia, nausea, vomiting, diarrhea  or change in bowel habits or change in bladder habits, change in stools or change in urine, dysuria, hematuria,  rash, arthralgias, visual complaints, headache, numbness, weakness L hand  or ataxia or problems with walking or coordination,  change in mood or  memory.        Current Meds  Medication Sig   acetaminophen (TYLENOL) 500  MG tablet Take 2 tablets (1,000 mg total) by mouth every 8 (eight) hours as needed for mild pain or fever.   albuterol (PROVENTIL) (2.5 MG/3ML) 0.083% nebulizer solution Take 3 mLs (2.5 mg total) by nebulization every 6 (six) hours as needed for wheezing or shortness of breath.   albuterol (VENTOLIN HFA) 108 (90 Base) MCG/ACT inhaler INHALE 2 PUFFS INTO THE LUNGS EVERY 6 HOURS AS NEEDEDFOR SHORTNESS OF BREATH OR WHEEZING.   amLODipine (NORVASC) 10 MG tablet TAKE 1 TABLET BY MOUTH ONCE A DAY.   ascorbic acid (VITAMIN C) 500 MG tablet Take 1 tablet (500 mg total) by mouth daily.   aspirin 81 MG EC tablet Take 1 tablet (81 mg total) by mouth daily. (Patient taking differently: Take 81 mg by mouth in the morning and at bedtime.)   atorvastatin (LIPITOR) 80 MG tablet Take 1 tablet by mouth once daily   azelastine (ASTELIN) 0.1 % nasal spray USE 2 SPRAYS IN EACH NOSTRIL TWICE DAILY.   Blood Glucose Monitoring Suppl (ACCU-CHEK GUIDE ME) w/Device KIT 1 Piece by Does not apply route as directed.   budesonide-formoterol (SYMBICORT) 160-4.5 MCG/ACT inhaler Take 2 puffs first thing in am and then another 2 puffs about 12 hours later.   Cholecalciferol (VITAMIN D3) 125 MCG (5000 UT) CAPS Take 1 capsule (5,000 Units total) by mouth daily.   citalopram (CELEXA) 20  MG tablet Take 1 tablet (20 mg total) by mouth daily.   cycloSPORINE (RESTASIS) 0.05 % ophthalmic emulsion Place 1 drop into both eyes 2 (two) times daily.   Dulaglutide (TRULICITY) 1.5 HL/4.5GY SOPN INJECT 1.5 MG INTO THE SKIN ONCE WEEKLY.   FARXIGA 10 MG TABS tablet TAKE ONE TABLET BY MOUTH ONCE DAILY.   ferrous sulfate 325 (65 FE) MG tablet Take 1 tablet (325 mg total) by mouth daily with breakfast.   furosemide (LASIX) 40 MG tablet Take 1 tablet (40 mg total) by mouth daily as needed for fluid.   glipiZIDE (GLUCOTROL XL) 5 MG 24 hr tablet TAKE 1 TABLET BY MOUTH DAILY WITH BREAKFAST.   glucose blood (ACCU-CHEK GUIDE) test strip Use to test glucose 2 times daily.   lactulose (CHRONULAC) 10 GM/15ML solution Take 15 mLs (10 g total) by mouth daily.   magnesium gluconate (MAGONATE) 500 MG tablet Take 500 mg by mouth 2 (two) times daily.   Melatonin 10 MG CAPS Take 10 mg by mouth at bedtime.   metFORMIN (GLUCOPHAGE) 500 MG tablet TAKE 1 TABLET BY MOUTH TWICE DAILY WITH MEALS.   mometasone (ELOCON) 0.1 % cream Apply 1 application topically daily as needed (eczema on ears).   nitroGLYCERIN (NITROSTAT) 0.4 MG SL tablet Place 1 tablet (0.4 mg total) under the tongue every 5 (five) minutes as needed for chest pain.   olopatadine (PATANOL) 0.1 % ophthalmic solution Place 1 drop into both eyes 2 (two) times daily.   pantoprazole (PROTONIX) 40 MG tablet TAKE (1) TABLET BY MOUTH TWICE DAILY BEFORE A MEAL.   traMADol (ULTRAM) 50 MG tablet Take 1 tablet (50 mg total) by mouth every 8 (eight) hours as needed for moderate pain or severe pain.            Past Medical History:  Diagnosis Date   ACE inhibitor-aggravated angioedema    Allergy    Angio-edema    Anxiety    Arthritis    Asthma    hip replacement   Back pain    Bradycardia 04/28/2017   Bulging of cervical intervertebral disc  CAD (coronary artery disease)    lateral STEMI 02/06/2015 00% D1 occlusion treated with Promus Premier 2.5 mm x 16  mm DES, 70% ramus stenosis, 40% mid RCA stenosis, 45% distal RCA stenosis, EF 45-50%   CHF (congestive heart failure) (HCC)    COPD (chronic obstructive pulmonary disease) ([place])    Depression    Diabetes mellitus without complication ([place])    Difficult intubation    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   Dry eye    Dyspnea    Early satiety 09/23/2016   GERD (gastroesophageal reflux disease)    HCAP (healthcare-associated pneumonia) 05/15/2017   Headache    Heart murmur    Hip pain    Hyperlipidemia    Hypertension    Lobar pneumonia ([place]) 05/15/2017   Melena 08/04/2018   MI (myocardial infarction) [facility])    Myocardial infarction ([place])    Neck pain    Non-ST elevation (NSTEMI) myocardial infarction ([place]) 04/27/2017   NSTEMI (non-ST elevated myocardial infarction) ([place]) 04/26/2017   Otitis media    Pleurisy    Pneumonia due to COVID-19 virus 10/07/2019   Rectal bleeding 11/08/2018   Right shoulder pain 03/20/2020   Sinus pause    9 sec sinus pause on telemetry after started on coreg after MI, avoid AV nodal blocking agent   Sleep apnea    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   STEMI (ST elevation myocardial infarction) ([place]) 05/31/2015   Substance abuse ([place]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   Syncope 10/62/6948   Syncope and collapse 05/15/2017   Transaminitis 08/04/2018   Unilateral primary osteoarthritis, right hip 07/01/2016      Objective:     06/13/2022     200   03/05/22 193 lb 9.6 oz (87.8 kg)  02/20/22 198 lb (89.8 kg)  02/20/22 197 lb 12.8 oz (89.7 kg)    Vital signs reviewed  06/13/2022  - Note at rest 02 sats  96% on RA   General appearance:    pot bellied amb wm / brown suspenders       HEENT : Oropharynx  clear  Nasal turbinates nl    NECK :  without  apparent JVD/ palpable Nodes/TM    LUNGS: no acc muscle use,  Min barrel  contour chest wall with bilateral  slightly decreased bs s audible wheeze and  without cough on insp or exp maneuvers and min   Hyperresonant  to  percussion bilaterally    CV:  RRR  no s3 or murmur or increase in P2, and no edema   ABD:  Quite obese soft and nontender with pos end  insp Hoover's  in the supine position.  No bruits or organomegaly appreciated   MS:  Nl gait/ ext warm without deformities    calf tenderness, cyanosis or clubbing     SKIN: warm and dry without lesions    NEURO:  alert, approp, nl sensorium with  weak L hand grip                 Assessment

## 2022-06-23 ENCOUNTER — Encounter: Payer: Self-pay | Admitting: "Endocrinology

## 2022-06-23 ENCOUNTER — Ambulatory Visit (INDEPENDENT_AMBULATORY_CARE_PROVIDER_SITE_OTHER): Payer: Medicare Other | Admitting: "Endocrinology

## 2022-06-23 VITALS — BP 120/72 | HR 60 | Ht 65.0 in | Wt 203.2 lb

## 2022-06-23 DIAGNOSIS — E782 Mixed hyperlipidemia: Secondary | ICD-10-CM | POA: Diagnosis not present

## 2022-06-23 DIAGNOSIS — F17200 Nicotine dependence, unspecified, uncomplicated: Secondary | ICD-10-CM | POA: Diagnosis not present

## 2022-06-23 DIAGNOSIS — I10 Essential (primary) hypertension: Secondary | ICD-10-CM

## 2022-06-23 DIAGNOSIS — E1159 Type 2 diabetes mellitus with other circulatory complications: Secondary | ICD-10-CM | POA: Diagnosis not present

## 2022-06-23 LAB — POCT GLYCOSYLATED HEMOGLOBIN (HGB A1C): HbA1c, POC (controlled diabetic range): 6.7 % (ref 0.0–7.0)

## 2022-06-23 NOTE — Progress Notes (Signed)
06/23/2022, 7:03 PM      Endocrinology follow-up note    Subjective:    Patient ID: Randy Hebert, male    DOB: 08-17-1966.  [name] is being seen in  follow-up for management of currently uncontrolled symptomatic type 2 diabetes, hyperlipidemia, hypertension. PMD:  Coral Spikes, DO.   Past Medical History:  Diagnosis Date   ACE inhibitor-aggravated angioedema    Allergy    Angio-edema    Anxiety    Arthritis    Asthma    hip replacement   Back pain    Bradycardia 04/28/2017   Bulging of cervical intervertebral disc    CAD (coronary artery disease)    lateral STEMI 02/06/2015 00% D1 occlusion treated with Promus Premier 2.5 mm x 16 mm DES, 70% ramus stenosis, 40% mid RCA stenosis, 45% distal RCA stenosis, EF 45-50%   CHF (congestive heart failure) (HCC)    COPD (chronic obstructive pulmonary disease) ([place])    Depression    Diabetes mellitus without complication ([place])    Difficult intubation    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   Dry eye    Dyspnea    Early satiety 09/23/2016   GERD (gastroesophageal reflux disease)    HCAP (healthcare-associated pneumonia) 05/15/2017   Headache    Heart murmur    Hip pain    Hyperlipidemia    Hypertension    Lobar pneumonia ([place]) 05/15/2017   Melena 08/04/2018   MI (myocardial infarction) ([place])    Myocardial infarction ([place])    Neck pain    Non-ST elevation (NSTEMI) myocardial infarction ([place]) 04/27/2017   NSTEMI (non-ST elevated myocardial infarction) (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   STEMI (ST elevation myocardial infarction) ([place]) 05/31/2015   Substance abuse ([place]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    Syncope 72/05/4708   Syncope and collapse 05/15/2017   Transaminitis 08/04/2018   Unilateral primary osteoarthritis, right hip 07/01/2016   Past Surgical History:  Procedure Laterality Date   BIOPSY  10/09/2016   Procedure: BIOPSY;  Surgeon: Daneil Dolin, MD;  [facility];  Service: Endoscopy;;   BIOPSY  11/28/2019   Procedure: BIOPSY;  Surgeon: Daneil Dolin, MD;  [facility];  Service: Endoscopy;;   CARDIAC CATHETERIZATION N/A 02/06/2015   Procedure: Left Heart Cath and Coronary Angiography;  Surgeon: Leonie Man, MD;  Location: [facility];  Service: Cardiovascular;  Laterality: N/A;   CARDIAC CATHETERIZATION N/A 02/06/2015   Procedure: Coronary Stent Intervention;  Surgeon: Leonie Man, MD;  Location: [facility];  Service: Cardiovascular;  Laterality: N/A;   COLONOSCOPY WITH PROPOFOL N/A 10/09/2016   Sigmoid and descending colon diverticulosis, four 4-6 mm polyps in sigmoid, one 4 mm polyp in descending. Tubular adenomas and hyperplastic. 5 year surveillance.    COLONOSCOPY WITH PROPOFOL N/A 11/24/2016   Sigmoid and descending colon diverticulosis, four 4-6 mm polyps in sigmoid, one 4 mm polyp in descending. Tubular adenomas and hyperplastic. 5 year surveillance.    COLONOSCOPY WITH PROPOFOL N/A 10/18/2021   [place]: nonbleeding  internal hemorrhoids, small and large mouth diverticula found in the sigmoid, descending, transverse colon.  A 9 mm sessile polyp was found in the ascending colon that was removed.  4 sessile polyps found in the sigmoid, descending, transverse colon 3 to 5 mm in size, examining otherwise.  Path revealed tubular adenomas.  Repeat due in 5 years for surveillance.   CORONARY ANGIOPLASTY WITH STENT PLACEMENT  01/2015   CORONARY STENT INTERVENTION N/A 04/27/2017   Procedure: CORONARY STENT INTERVENTION;  Surgeon: Nelva Bush, MD;  Location: [facility];  Service: Cardiovascular;  Laterality: N/A;    ELECTROPHYSIOLOGY STUDY N/A 06/29/2017   Procedure: ELECTROPHYSIOLOGY STUDY;  Surgeon: Evans Lance, MD;  Location: [facility];  Service: Cardiovascular;  Laterality: N/A;   ESOPHAGOGASTRODUODENOSCOPY (EGD) WITH PROPOFOL N/A 10/09/2016   Dr. Gala Romney: LA grade a esophagitis.  Barrett's esophagus, biopsy-proven.  Small hiatal hernia.  EGD February 2019   ESOPHAGOGASTRODUODENOSCOPY (EGD) WITH PROPOFOL N/A 11/28/2019    salmon-colored esophageal mucosa (Barrett's) small hiatal hernia, portal hypertensive gastropathy, normal duodenum, 3 year surveillance   ESOPHAGOGASTRODUODENOSCOPY (EGD) WITH PROPOFOL N/A 09/06/2021   three columns of grade 1 varices in distal esophagus, no stigmata of bleeding or red wale signs. Small hiatal hernia. Mild portal gastropathy. Normal duodenum. Repeat upper endoscopy in 1 year for surveillance.   INCISION / DRAINAGE HAND / FINGER     LEFT HEART CATH AND CORONARY ANGIOGRAPHY N/A 04/27/2017   Procedure: LEFT HEART CATH AND CORONARY ANGIOGRAPHY;  Surgeon: Nelva Bush, MD;  Location: [facility];  Service: Cardiovascular;  Laterality: N/A;   LOOP RECORDER INSERTION  06/29/2017   Procedure: Loop Recorder Insertion;  Surgeon: Evans Lance, MD;  Location: [facility];  Service: Cardiovascular;;   POLYPECTOMY  11/24/2016   Procedure: POLYPECTOMY;  Surgeon: Daneil Dolin, MD;  [facility];  Service: Endoscopy;;  descending and sigmoid   POLYPECTOMY  10/18/2021   Procedure: POLYPECTOMY;  Surgeon: Eloise Harman, DO;  [facility];  Service: Endoscopy;;   TOTAL HIP ARTHROPLASTY Right 07/01/2016   TOTAL HIP ARTHROPLASTY Right 07/01/2016   Procedure: RIGHT TOTAL HIP ARTHROPLASTY ANTERIOR APPROACH;  Surgeon: Mcarthur Rossetti, MD;  Location: [facility];  Service: Orthopedics;  Laterality: Right;   Social History   Socioeconomic History   Marital status: Divorced    Spouse name: alice   Number of children: 3   Years of  education: 12   Highest education level: Not on file  Occupational History   Occupation: [employer]  Tobacco Use   Smoking status: Every Day    Packs/day: 1.00    Years: 46.00    Total pack years: 46.00    Types: Cigarettes   Smokeless tobacco: Former    Types: Chew   Tobacco comments:    Smokes 1 pack a day [facility] 12/06/21  Vaping Use   Vaping Use: Former  Substance and Sexual Activity   Alcohol use: Not Currently    Comment: couple beers occ- stopped 09/2021   Drug use: No    Comment: history of drug use- marijuana, cocaine- quit 2014   Sexual activity: Yes    Partners: Female    Birth control/protection: None  Other Topics Concern   Not on file  Social History Narrative   Lives with girlfriend [name]   3 children, but 1 OD  In 2019.   Grandchildren-2      Two cats: may and princess; and a dog-foxy  Enjoy: spending time with pets, TV, yard work       Diet: eats all food groups   Caffeine: half and half coffee, some tea-half and half decaf   Water: 6-8 cups daily      Wears seat belt   Does not use phone while driving   Smoke detectors at home      Social Determinants of Health   Financial Resource Strain: Low Risk  (10/28/2021)   Overall Financial Resource Strain (CARDIA)    Difficulty of Paying Living Expenses: Not hard at all  Food Insecurity: No Food Insecurity (01/23/2022)   Hunger Vital Sign    Worried About Running Out of Food in the Last Year: Never true    Ran Out of Food in the Last Year: Never true  Transportation Needs: No Transportation Needs (01/23/2022)   PRAPARE - Hydrologist (Medical): No    Lack of Transportation (Non-Medical): No  Physical Activity: Inactive (10/28/2021)   Exercise Vital Sign    Days of Exercise per Week: 0 days    Minutes of Exercise per Session: 0 min  Stress: No Stress Concern Present (10/28/2021)   [place]    Feeling of Stress : Not  at all  Social Connections: Moderately Integrated (10/28/2021)   Social Connection and Isolation Panel [NHANES]    Frequency of Communication with Friends and Family: More than three times a week    Frequency of Social Gatherings with Friends and Family: Twice a week    Attends Religious Services: More than 4 times per year    Active Member of [employer] or Organizations: Yes    Attends Archivist Meetings: More than 4 times per year    Marital Status: Divorced   Outpatient Encounter Medications as of 06/23/2022  Medication Sig   acetaminophen (TYLENOL) 500 MG tablet Take 2 tablets (1,000 mg total) by mouth every 8 (eight) hours as needed for mild pain or fever.   albuterol (PROVENTIL) (2.5 MG/3ML) 0.083% nebulizer solution Take 3 mLs (2.5 mg total) by nebulization every 6 (six) hours as needed for wheezing or shortness of breath.   albuterol (VENTOLIN HFA) 108 (90 Base) MCG/ACT inhaler INHALE 2 PUFFS INTO THE LUNGS EVERY 6 HOURS AS NEEDEDFOR SHORTNESS OF BREATH OR WHEEZING.   amLODipine (NORVASC) 10 MG tablet TAKE 1 TABLET BY MOUTH ONCE A DAY.   ascorbic acid (VITAMIN C) 500 MG tablet Take 1 tablet (500 mg total) by mouth daily.   aspirin 81 MG EC tablet Take 1 tablet (81 mg total) by mouth daily. (Patient taking differently: Take 81 mg by mouth in the morning and at bedtime.)   atorvastatin (LIPITOR) 80 MG tablet Take 1 tablet by mouth once daily   azelastine (ASTELIN) 0.1 % nasal spray USE 2 SPRAYS IN EACH NOSTRIL TWICE DAILY.   Blood Glucose Monitoring Suppl (ACCU-CHEK GUIDE ME) w/Device KIT 1 Piece by Does not apply route as directed.   budesonide-formoterol (SYMBICORT) 160-4.5 MCG/ACT inhaler Take 2 puffs first thing in am and then another 2 puffs about 12 hours later.   Cholecalciferol (VITAMIN D3) 125 MCG (5000 UT) CAPS Take 1 capsule (5,000 Units total) by mouth daily.   citalopram (CELEXA) 20 MG tablet Take 1 tablet (20 mg total) by mouth daily.   cycloSPORINE (RESTASIS) 0.05 %  ophthalmic emulsion Place 1 drop into both eyes 2 (two) times daily.   Dulaglutide (TRULICITY) 1.5 QI/2.9NL SOPN INJECT 1.5  MG INTO THE SKIN ONCE WEEKLY.   FARXIGA 10 MG TABS tablet TAKE ONE TABLET BY MOUTH ONCE DAILY.   ferrous sulfate 325 (65 FE) MG tablet Take 1 tablet (325 mg total) by mouth daily with breakfast.   furosemide (LASIX) 40 MG tablet Take 1 tablet (40 mg total) by mouth daily as needed for fluid.   gabapentin (NEURONTIN) 300 MG capsule Take by mouth.   glipiZIDE (GLUCOTROL XL) 5 MG 24 hr tablet TAKE 1 TABLET BY MOUTH DAILY WITH BREAKFAST.   glucose blood (ACCU-CHEK GUIDE) test strip Use to test glucose 2 times daily.   lactulose (CHRONULAC) 10 GM/15ML solution Take 15 mLs (10 g total) by mouth daily.   magnesium gluconate (MAGONATE) 500 MG tablet Take 500 mg by mouth 2 (two) times daily.   Melatonin 10 MG CAPS Take 10 mg by mouth at bedtime.   metFORMIN (GLUCOPHAGE) 500 MG tablet TAKE 1 TABLET BY MOUTH TWICE DAILY WITH MEALS.   mometasone (ELOCON) 0.1 % cream Apply 1 application topically daily as needed (eczema on ears).   nicotine (QC NICOTINE TRANSDERMAL SYSTEM) 14 mg/24hr patch Place 1 patch (14 mg total) onto the skin daily.   nitroGLYCERIN (NITROSTAT) 0.4 MG SL tablet Place 1 tablet (0.4 mg total) under the tongue every 5 (five) minutes as needed for chest pain.   olopatadine (PATANOL) 0.1 % ophthalmic solution Place 1 drop into both eyes 2 (two) times daily.   pantoprazole (PROTONIX) 40 MG tablet TAKE (1) TABLET BY MOUTH TWICE DAILY BEFORE A MEAL.   traMADol (ULTRAM) 50 MG tablet Take 1 tablet (50 mg total) by mouth every 8 (eight) hours as needed for moderate pain or severe pain.   Facility-Administered Encounter Medications as of 06/23/2022  Medication   nicotine (NICODERM CQ - dosed in mg/24 hr) patch 14 mg    ALLERGIES: Allergies  Allergen Reactions   Carvedilol Other (See Comments)    Sinus pause on telemetry >3 seconds. Longest one 9 sec. No AV nodal agent    Lisinopril Anaphylaxis, Shortness Of Breath and Swelling    Angioedema, required intubation and mechanical ventilation   Chantix [Varenicline] Other (See Comments)    Nightmares and unable to sleep well   Amoxicillin Nausea Only    Did it involve swelling of the face/tongue/throat, SOB, or low BP? No Did it involve sudden or severe rash/hives, skin peeling, or any reaction on the inside of your mouth or nose? No Did you need to seek medical attention at a hospital or doctor's office? No When did it last happen? childhood reaction      If all above answers are "NO", may proceed with cephalosporin use.     VACCINATION STATUS: Immunization History  Administered Date(s) Administered   Influenza,inj,Quad PF,6+ Mos 07/03/2016, 05/07/2017, 06/20/2020, 07/24/2021, 06/13/2022   Influenza-Unspecified 05/17/2018   Moderna SARS-COV2 Booster Vaccination 03/01/2020   Moderna Sars-Covid-2 Vaccination 02/20/2020   Pneumococcal Conjugate-13 06/17/2017   Pneumococcal Polysaccharide-23 02/07/2015   Tdap 05/17/2019    Diabetes He presents for his follow-up diabetic visit. He has type 2 diabetes mellitus. Onset time: He was diagnosed at approximate age of 34 years. His disease course has been stable. There are no hypoglycemic associated symptoms. Pertinent negatives for hypoglycemia include no confusion, headaches, pallor or seizures. Pertinent negatives for diabetes include no chest pain, no fatigue, no polydipsia, no polyphagia, no polyuria and no weakness. There are no hypoglycemic complications. Symptoms are stable. Diabetic complications include heart disease and peripheral neuropathy. Risk factors for coronary  artery disease include dyslipidemia, diabetes mellitus, hypertension, male sex, family history, obesity, sedentary lifestyle and tobacco exposure. Current diabetic treatment includes oral agent (monotherapy). He is compliant with treatment most of the time. His weight is fluctuating minimally. He is  following a generally unhealthy diet. When asked about meal planning, he reported none. He has had a previous visit with a dietitian. He never participates in exercise. His home blood glucose trend is fluctuating minimally. His breakfast blood glucose range is generally 130-140 mg/dl. His bedtime blood glucose range is generally 140-180 mg/dl. His overall blood glucose range is 140-180 mg/dl. (He presents with overall improvement of his glycemia, averaging 151 over the last30 and point-of-care A1c of 6.7%.  He did not document or report any recent hypoglycemia.     ) An ACE inhibitor/angiotensin II receptor blocker is contraindicated (He has documented allergy for ACE inhibitor's). He does not see a podiatrist.Eye exam is not current.  Hyperlipidemia This is a chronic problem. The current episode started more than 1 year ago. Exacerbating diseases include diabetes and obesity. Pertinent negatives include no chest pain, myalgias or shortness of breath. Current antihyperlipidemic treatment includes statins. Risk factors for coronary artery disease include dyslipidemia, diabetes mellitus, family history, obesity, male sex, hypertension and a sedentary lifestyle.  Hypertension This is a chronic problem. The current episode started more than 1 year ago. Pertinent negatives include no chest pain, headaches, neck pain, palpitations or shortness of breath. Risk factors for coronary artery disease include diabetes mellitus, dyslipidemia, obesity, sedentary lifestyle and smoking/tobacco exposure. Past treatments include calcium channel blockers. Hypertensive end-organ damage includes CAD/MI.    Review of systems    Objective:    BP 120/72   Pulse 60   Ht $R'5\' 5"'jh$  (1.651 m)   Wt 203 lb 3.2 oz (92.2 kg)   BMI 33.81 kg/m   Wt Readings from Last 3 Encounters:  06/23/22 203 lb 3.2 oz (92.2 kg)  06/13/22 200 lb 12.8 oz (91.1 kg)  05/19/22 197 lb 12.8 oz (89.7 kg)     Physical Exam- Limited   CMP      Component Value Date/Time   NA 139 02/17/2022 0811   K 4.2 02/17/2022 0811   CL 102 02/17/2022 0811   CO2 22 02/17/2022 0811   GLUCOSE 85 02/17/2022 0811   GLUCOSE 140 (H) 09/06/2021 0704   BUN 8 02/17/2022 0811   CREATININE 0.83 02/17/2022 0811   CREATININE 0.67 (L) 06/21/2020 0707   CALCIUM 9.7 02/17/2022 0811   PROT 7.2 02/17/2022 0811   ALBUMIN 4.3 02/17/2022 0811   AST 44 (H) 02/17/2022 0811   ALT 52 (H) 02/17/2022 0811   ALKPHOS 144 (H) 02/17/2022 0811   BILITOT 0.9 02/17/2022 0811   GFRNONAA >60 09/06/2021 0704   GFRNONAA 108 06/21/2020 0707   GFRAA 127 10/24/2020 0811   GFRAA 125 06/21/2020 0707    Diabetic Labs (most recent): Lab Results  Component Value Date   HGBA1C 6.7 06/23/2022   HGBA1C 6.5 (H) 02/17/2022   HGBA1C 6.8 (H) 09/03/2021   MICROALBUR 1.8 11/09/2019   MICROALBUR 0.7 11/17/2017   MICROALBUR 0.9 04/15/2017     Lipid Panel ( most recent) Lipid Panel     Component Value Date/Time   CHOL 137 02/17/2022 0811   TRIG 147 02/17/2022 0811   HDL 25 (L) 02/17/2022 0811   CHOLHDL 5.5 (H) 02/17/2022 0811   CHOLHDL 7.1 (H) 06/21/2020 0707   VLDL 41 (H) 04/27/2017 0505   LDLCALC 86 02/17/2022 6767  [place] 116 (H) 06/21/2020 0707      Lab Results  Component Value Date   TSH 3.61 06/21/2020   TSH 1.58 11/03/2018   TSH 1.79 11/17/2017   TSH 1.635 04/26/2017   TSH 2.564 06/27/2015   FREET4 1.5 11/03/2018   FREET4 1.4 11/17/2017      Assessment & Plan:   1. DM type 2 causing vascular disease ([place])  - [name] has currently uncontrolled symptomatic type 2 DM since  56 years of age.  He presents with overall improvement of his glycemia, averaging 151 over the last30 and point-of-care A1c of 6.7%.  He did not document or report any recent hypoglycemia.     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  He also has  recent diagnosis of CHF, coronary artery disease which required stent placement,  and [name] remains at extremely high risk for more acute and chronic complications which include CAD, CVA, CKD, retinopathy, and neuropathy. These are all discussed in detail with the patient.  - I have counseled him on diet management and weight loss, by adopting a carbohydrate restricted/protein rich diet. -Still admits to dietary indiscretions including consumption of sweets and sweetened beverages.   he acknowledges that there is a room for improvement in his food and drink choices. - Suggestion is made for him to avoid simple carbohydrates  from his diet including Cakes, Sweet Desserts, Ice Cream, Soda (diet and regular), Sweet Tea, Candies, Chips, Cookies, Store Bought Juices, Alcohol in Excess of  1-2 drinks a day, Artificial Sweeteners,  Coffee Creamer, and "Sugar-free" Products, Lemonade. This will help patient to have more stable blood glucose profile and potentially avoid unintended weight gain.  - I encouraged him to switch to  unprocessed or minimally processed complex starch and increased protein intake (animal or plant source), fruits, and vegetables.  - he is advised to stick to a routine mealtimes to eat 3 meals  a day and avoid unnecessary snacks ( to snack only to correct hypoglycemia).    - I have approached him with the following individualized plan to manage diabetes and patient agrees:   -In light of his presentation with near target glycemic profile, he will not need insulin treatment for now.  He is advised to continue Farxiga 10 mg p.o. daily at breakfast started by his cardiologist.  He is also advised to continue glipizide  5 mg XL p.o. daily at breakfast.  He is on Trulicity 1.5 mg subcutaneously weekly, advised to continue.     He is approached to continue monitoring blood glucose at least twice a day-daily before breakfast and at bedtime.   The single most important  intervention would be smoking cessation and dietary change for him.  The patient was counseled on the dangers of tobacco use, and was advised to quit.  Reviewed strategies to maximize success, including removing cigarettes and smoking materials from environment.    I discussed [employer] plant predominant lifestyle nutrition with him. -The fact that he is a heavy smoker complicates optimal utility of GLP-1 receptor agonists due to risk of pancreatitis.  He claims to have stopped alcohol, however continues to smoke heavily.  -He is encouraged to call clinic for blood glucose readings less than 70 or greater than 200x3 in a week.    -Patient specific target  A1c;  LDL, HDL, Triglycerides, and  Waist Circumference were discussed in detail.  2) BP/HTN:  -His BP is controlled to target.  -He remains a chronic heavy smoker. He has a documented allergy of ACE inhibitors anaphylaxis .  He is advised to continue amlodipine 10 mg p.o. daily, with as needed diuretics.  He is extensively counseled for smoking cessation.     3) Lipids/HPL: Recent labs show LDL 86, improving from 116.  He reports better consistency of taking his atorvastatin 80 mg p.o. daily at bedtime.  He has cirrhosis of the liver, advised to avoid fried food, saturated fat/trans fats.   4)  Weight/Diet: His BMI is 22.95- abdominal obesity clearly complicating his diabetes care.  He is a candidate for modest weight loss.  CDE Consult has been initiated and in progress,  exercise, and detailed carbohydrates information provided.  5) Chronic Care/Health Maintenance:  -he  is on Statin medications and  is encouraged to continue to follow up with Ophthalmology, Dentist,  Podiatrist at least yearly or according to recommendations, and advised to  Quit smoking ( is a chronic heavy smoker for the last 40 days), and consider weaning off of alcohol.  I have recommended yearly flu vaccine and pneumonia vaccination at least every 5 years;  moderate intensity exercise for up to 150 minutes weekly; and  sleep for at least 7 hours a day.  The patient was counseled on the dangers of tobacco use, and was advised to quit.  Reviewed strategies to maximize success, including removing cigarettes and smoking materials from environment.    His recent screening ABI was negative for PAD in September 2022.  His study will be repeated in 5 years or sooner if needed.    - I advised patient to maintain close follow up with Coral Spikes, DO for primary care needs.    I spent 35 minutes in the care of the patient today including review of labs from [place], Lipids, Thyroid Function, Hematology (current and previous including abstractions from other facilities); face-to-face time discussing  his blood glucose readings/logs, discussing hypoglycemia and hyperglycemia episodes and symptoms, medications doses, his options of short and long term treatment based on the latest standards of care / guidelines;  discussion about incorporating lifestyle medicine;  and documenting the encounter. Risk reduction counseling performed per USPSTF guidelines to reduce obesity and cardiovascular risk factors.     Please refer to Patient Instructions for Blood Glucose Monitoring and Insulin/Medications Dosing Guide"  in media tab for additional information. Please  also refer to " Patient Self Inventory" in the Media  tab for reviewed elements of pertinent patient history.  [name] participated in the discussions, expressed understanding, and voiced agreement with the above plans.  All questions were answered to his satisfaction. he is encouraged to contact clinic should he have any questions or concerns prior to his return visit.     Follow up plan: - Return in about 4 months (around 10/24/2022) for F/U with Pre-visit Labs, Meter/CGM/Logs, A1c here.  Glade Lloyd, MD [facility] [address] [place], [place] [address] Phone: [phone]  Fax: [phone]    06/23/2022, 7:03 PM  This note was partially dictated with voice recognition software. Similar sounding words can be transcribed inadequately or may not  be corrected upon review.

## 2022-06-23 NOTE — Patient Instructions (Signed)
                                     Advice for Weight Management  -For most of us the best way to lose weight is by diet management. Generally speaking, diet management means consuming less calories intentionally which over time brings about progressive weight loss.  This can be achieved more effectively by avoiding ultra processed carbohydrates, processed meats, unhealthy fats.    It is critically important to know your numbers: how much calorie you are consuming and how much calorie you need. More importantly, our carbohydrates sources should be unprocessed naturally occurring  complex starch food items.  It is always important to balance nutrition also by  appropriate intake of proteins (mainly plant-based), healthy fats/oils, plenty of fruits and vegetables.   -The American College of Lifestyle Medicine (ACL M) recommends nutrition derived mostly from Whole Food, Plant Predominant Sources example an apple instead of applesauce or apple pie. Eat Plenty of vegetables, Mushrooms, fruits, Legumes, Whole Grains, Nuts, seeds in lieu of processed meats, processed snacks/pastries red meat, poultry, eggs.  Use only water or unsweetened tea for hydration.  The College also recommends the need to stay away from risky substances including alcohol, smoking; obtaining 7-9 hours of restorative sleep, at least 150 minutes of moderate intensity exercise weekly, importance of healthy social connections, and being mindful of stress and seek help when it is overwhelming.    -Sticking to a routine mealtime to eat 3 meals a day and avoiding unnecessary snacks is shown to have a big role in weight control. Under normal circumstances, the only time we burn stored energy is when we are hungry, so allow  some hunger to take place- hunger means no food between appropriate meal times, only water.  It is not advisable to starve.   -It is better to avoid simple carbohydrates including:  Cakes, Sweet Desserts, Ice Cream, Soda (diet and regular), Sweet Tea, Candies, Chips, Cookies, Store Bought Juices, Alcohol in Excess of  1-2 drinks a day, Lemonade,  Artificial Sweeteners, Doughnuts, Coffee Creamers, "Sugar-free" Products, etc, etc.  This is not a complete list.....    -Consulting with certified diabetes educators is proven to provide you with the most accurate and current information on diet.  Also, you may be  interested in discussing diet options/exchanges , we can schedule a visit with Randy Hebert, RDN, CDE for individualized nutrition education.  -Exercise: If you are able: 30 -60 minutes a day ,4 days a week, or 150 minutes of moderate intensity exercise weekly.    The longer the better if tolerated.  Combine stretch, strength, and aerobic activities.  If you were told in the past that you have high risk for cardiovascular diseases, or if you are currently symptomatic, you may seek evaluation by your heart doctor prior to initiating moderate to intense exercise programs.                                  Additional Care Considerations for Diabetes/Prediabetes   -Diabetes  is a chronic disease.  The most important care consideration is regular follow-up with your diabetes care provider with the goal being avoiding or delaying its complications and to take advantage of advances in medications and technology.  If appropriate actions are taken early enough, type 2 diabetes can even be   reversed.  Seek information from the right source.  - Whole Food, Plant Predominant Nutrition is highly recommended: Eat Plenty of vegetables, Mushrooms, fruits, Legumes, Whole Grains, Nuts, seeds in lieu of processed meats, processed snacks/pastries red meat, poultry, eggs as recommended by American College of  Lifestyle Medicine (ACLM).  -Type 2 diabetes is known to coexist with other important comorbidities such as high blood pressure and high cholesterol.  It is critical to control not only the  diabetes but also the high blood pressure and high cholesterol to minimize and delay the risk of complications including coronary artery disease, stroke, amputations, blindness, etc.  The good news is that this diet recommendation for type 2 diabetes is also very helpful for managing high cholesterol and high blood blood pressure.  - Studies showed that people with diabetes will benefit from a class of medications known as ACE inhibitors and statins.  Unless there are specific reasons not to be on these medications, the standard of care is to consider getting one from these groups of medications at an optimal doses.  These medications are generally considered safe and proven to help protect the heart and the kidneys.    - People with diabetes are encouraged to initiate and maintain regular follow-up with eye doctors, foot doctors, dentists , and if necessary heart and kidney doctors.     - It is highly recommended that people with diabetes quit smoking or stay away from smoking, and get yearly  flu vaccine and pneumonia vaccine at least every 5 years.  See above for additional recommendations on exercise, sleep, stress management , and healthy social connections.      

## 2022-06-25 ENCOUNTER — Ambulatory Visit (HOSPITAL_COMMUNITY)
Admission: RE | Admit: 2022-06-25 | Discharge: 2022-06-25 | Disposition: A | Discharge status: Home / Self Care | Payer: Medicare Other | Source: Ambulatory Visit | Attending: Acute Care | Admitting: Acute Care

## 2022-06-25 ENCOUNTER — Ambulatory Visit (INDEPENDENT_AMBULATORY_CARE_PROVIDER_SITE_OTHER): Payer: Medicare Other | Admitting: Family Medicine

## 2022-06-25 DIAGNOSIS — E1159 Type 2 diabetes mellitus with other circulatory complications: Secondary | ICD-10-CM | POA: Diagnosis not present

## 2022-06-25 DIAGNOSIS — I5042 Chronic combined systolic (congestive) and diastolic (congestive) heart failure: Secondary | ICD-10-CM

## 2022-06-25 DIAGNOSIS — I10 Essential (primary) hypertension: Secondary | ICD-10-CM

## 2022-06-25 DIAGNOSIS — Z87891 Personal history of nicotine dependence: Secondary | ICD-10-CM | POA: Diagnosis not present

## 2022-06-25 DIAGNOSIS — M5412 Radiculopathy, cervical region: Secondary | ICD-10-CM

## 2022-06-25 DIAGNOSIS — Z122 Encounter for screening for malignant neoplasm of respiratory organs: Secondary | ICD-10-CM | POA: Insufficient documentation

## 2022-06-25 DIAGNOSIS — F17210 Nicotine dependence, cigarettes, uncomplicated: Secondary | ICD-10-CM | POA: Diagnosis not present

## 2022-06-25 NOTE — Patient Instructions (Signed)
Continue your medications.  Follow up in 6 months.  Take care  Dr. Lacinda Axon

## 2022-06-26 LAB — HM DIABETES EYE EXAM

## 2022-06-26 NOTE — Progress Notes (Signed)
Subjective:  Patient ID: Randy Hebert, male    DOB: August 02, 1966  Age: 56 y.o. MRN: [id number]  CC: Chief Complaint  Patient presents with   pain     Neck, back -lower area, numbness and tingling in both hands    HPI:  56 year old male with extensive past medical history presents for follow-up.  Patient's A1c is at goal on metformin, Trulicity, and [place].  He continues to have ongoing issues with neck pain and radiculopathy.  He has seen a Psychologist, sport and exercise and there is plan for upcoming surgery.  He needs an ENT evaluation to evaluate his vocal cords per his surgeon.  Patient is eager to have surgery given the significant mount of pain that he has.  Currently on tramadol.  Hypertension is stable on amlodipine and Lasix.  Patient Active Problem List   Diagnosis Date Noted   Osteoarthritis of both hands 05/19/2022   Cervical radiculopathy 02/21/2022   History of GI bleed 11/13/2021   Insomnia 09/13/2021   Seasonal and perennial allergic rhinitis 11/10/2018   Cirrhosis of liver ([place]) 11/08/2018   Barrett's esophagus 08/04/2018   Vitamin D deficiency 06/30/2018   Alcohol abuse 06/30/2018   DM type 2 causing vascular disease ([place]) 08/17/2017   Chronic combined systolic and diastolic CHF (congestive heart failure) ([place]) 05/15/2017   OSA (obstructive sleep apnea) 04/28/2017   Depression with anxiety 04/15/2017   GERD (gastroesophageal reflux disease) 09/23/2016   Asthmatic bronchitis , chronic 08/27/2015   Mixed hyperlipidemia 08/27/2015   Class 1 obesity due to excess calories with serious comorbidity and body mass index (BMI) of 32.0 to 32.9 in adult 08/27/2015   CAD (coronary artery disease) 05/31/2015   Current smoker 05/31/2015   Essential hypertension, benign 02/09/2015    Social Hx   Social History   Socioeconomic History   Marital status: Divorced    Spouse name: alice   Number of children: 3   Years of education: 12   Highest education level: Not on file   Occupational History   Occupation: SSI  Tobacco Use   Smoking status: Every Day    Packs/day: 1.00    Years: 46.00    Total pack years: 46.00    Types: Cigarettes   Smokeless tobacco: Former    Types: Chew   Tobacco comments:    Smokes 1 pack a day [facility] 12/06/21  Vaping Use   Vaping Use: Former  Substance and Sexual Activity   Alcohol use: Not Currently    Comment: couple beers occ- stopped 09/2021   Drug use: No    Comment: history of drug use- marijuana, cocaine- quit 2014   Sexual activity: Yes    Partners: Female    Birth control/protection: None  Other Topics Concern   Not on file  Social History Narrative   Lives with girlfriend [name]   3 children, but 1 OD  In 2019.   Grandchildren-2      Two cats: may and princess; and a dog-foxy      Enjoy: spending time with pets, TV, yard work       Diet: eats all food groups   Caffeine: half and half coffee, some tea-half and half decaf   Water: 6-8 cups daily      Wears seat belt   Does not use phone while driving   Oceanographer at home      Social Determinants of Health   Financial Resource Strain: Low Risk  (10/28/2021)   Overall Emergency planning/management officer Strain (  CARDIA)    Difficulty of Paying Living Expenses: Not hard at all  Food Insecurity: No Food Insecurity (01/23/2022)   Hunger Vital Sign    Worried About Running Out of Food in the Last Year: Never true    Ran Out of Food in the Last Year: Never true  Transportation Needs: No Transportation Needs (01/23/2022)   PRAPARE - Hydrologist (Medical): No    Lack of Transportation (Non-Medical): No  Physical Activity: Inactive (10/28/2021)   Exercise Vital Sign    Days of Exercise per Week: 0 days    Minutes of Exercise per Session: 0 min  Stress: No Stress Concern Present (10/28/2021)   Thermal    Feeling of Stress : Not at all  Social Connections: Moderately Integrated  (10/28/2021)   Social Connection and Isolation Panel [NHANES]    Frequency of Communication with Friends and Family: More than three times a week    Frequency of Social Gatherings with Friends and Family: Twice a week    Attends Religious Services: More than 4 times per year    Active Member of [employer] or Organizations: Yes    Attends Music therapist: More than 4 times per year    Marital Status: Divorced    Review of Systems Per HPI  Objective:  BP 110/64   Pulse 78   Temp (!) 97.5 F (36.4 C)   Ht 5\' 5"  (1.651 m)   Wt 200 lb (90.7 kg)   SpO2 95%   BMI 33.28 kg/m      06/25/2022    2:07 PM 06/23/2022    1:49 PM 06/13/2022    8:43 AM  BP/Weight  Systolic BP 144 315 400  Diastolic BP 64 72 88  Wt. (Lbs) 200 203.2 200.8  BMI 33.28 kg/m2 33.81 kg/m2 33.41 kg/m2    Physical Exam Vitals and nursing note reviewed.  Constitutional:      General: He is not in acute distress.    Appearance: Normal appearance. He is obese.  HENT:     Head: Normocephalic and atraumatic.  Eyes:     General:        Right eye: No discharge.        Left eye: No discharge.     Conjunctiva/sclera: Conjunctivae normal.  Neck:     Comments: Decreased range of motion of the neck. Cardiovascular:     Rate and Rhythm: Normal rate and regular rhythm.  Pulmonary:     Effort: Pulmonary effort is normal.     Breath sounds: Normal breath sounds. No wheezing or rales.  Neurological:     Mental Status: He is alert.  Psychiatric:        Mood and Affect: Mood normal.        Behavior: Behavior normal.     Lab Results  Component Value Date   WBC 6.9 02/17/2022   HGB 16.1 02/17/2022   HCT 48.4 02/17/2022   PLT 197 02/17/2022   GLUCOSE 85 02/17/2022   CHOL 137 02/17/2022   TRIG 147 02/17/2022   HDL 25 (L) 02/17/2022   LDLCALC 86 02/17/2022   ALT 52 (H) 02/17/2022   AST 44 (H) 02/17/2022   NA 139 02/17/2022   K 4.2 02/17/2022   CL 102 02/17/2022   CREATININE 0.83 02/17/2022    BUN 8 02/17/2022   CO2 22 02/17/2022   TSH 3.61 06/21/2020   PSA 0.23 06/21/2020  INR 1.0 09/06/2021   HGBA1C 6.7 06/23/2022   MICROALBUR 1.8 11/09/2019     Assessment & Plan:   Problem List Items Addressed This Visit       Cardiovascular and Mediastinum   Essential hypertension, benign    Hypertension is stable.  Continue current medications.      DM type 2 causing vascular disease ([facility])    A1c at goal.  Continue current medications.      Chronic combined systolic and diastolic CHF (congestive heart failure) ([place])    Euvolemic today.        Nervous and Auditory   Cervical radiculopathy    Tramadol as needed.  Awaiting surgery.      Follow-up:  Return in about 6 months (around 12/25/2022).  [place]

## 2022-06-26 NOTE — Assessment & Plan Note (Signed)
Tramadol as needed.  Awaiting surgery.

## 2022-06-26 NOTE — Assessment & Plan Note (Signed)
Hypertension is stable.  Continue current medications.

## 2022-06-26 NOTE — Assessment & Plan Note (Signed)
A1c at goal.  Continue current medications.

## 2022-06-26 NOTE — Assessment & Plan Note (Signed)
Euvolemic today. 

## 2022-06-27 ENCOUNTER — Other Ambulatory Visit: Payer: Self-pay | Admitting: "Endocrinology

## 2022-06-27 ENCOUNTER — Telehealth: Payer: Self-pay | Admitting: Acute Care

## 2022-06-27 ENCOUNTER — Other Ambulatory Visit: Payer: Self-pay | Admitting: Gastroenterology

## 2022-06-27 NOTE — Telephone Encounter (Signed)
I have attempted to call the patient with the results of their low dose CT. There was no answer. I left a HIPPA compliant message on their VM with the office contact number requesting that they call [phone] to review the results of the scan.

## 2022-06-27 NOTE — Telephone Encounter (Signed)
Noted  

## 2022-06-27 NOTE — Telephone Encounter (Signed)
Call report received from [place]:  CLINICAL DATA:  Current smoker with 43 pack-year history   EXAM: CT CHEST WITHOUT CONTRAST LOW-DOSE FOR LUNG CANCER SCREENING   TECHNIQUE: Multidetector CT imaging of the chest was performed following the standard protocol without IV contrast.   RADIATION DOSE REDUCTION: This exam was performed according to the departmental dose-optimization program which includes automated exposure control, adjustment of the mA and/or kV according to patient size and/or use of iterative reconstruction technique.   COMPARISON:  Lung cancer screening CT dated Jan 25, 2021   FINDINGS: Cardiovascular: Normal heart size. No pericardial effusion. Incidental note is made of right-sided aortic arch. Normal caliber thoracic aorta with calcified plaque. Severe left main and three-vessel coronary artery calcifications.   Mediastinum/Nodes: Esophagus and thyroid are unremarkable. No pathologically enlarged lymph nodes seen in the chest.   Lungs/Pleura: Central airways are patent. No consolidation, pleural effusion or pneumothorax. New solid pulmonary nodule of the right upper lobe measuring 10.3 mm in mean diameter on image 58. Additional small solid pulmonary nodule of the right upper lobe measuring 3.5 mm on image 140. Previously described ground-glass nodule is no longer present.   Upper Abdomen: Nodular liver contour.  No acute abnormality.   Musculoskeletal: No chest wall mass or suspicious bone lesions identified.   IMPRESSION: 1. New solid pulmonary nodule of the right upper lobe measuring 10.3 mm.Lung-RADS 4B, suspicious. Additional imaging evaluation or consultation with Pulmonology or Thoracic Surgery recommended. 2. Severe left main and three-vessel coronary artery calcifications. 3. Aortic Atherosclerosis (ICD10-I70.0) and Emphysema (ICD10-J43.9).   These results will be called to the ordering clinician or representative by the Radiologist  Assistant, and communication documented in the PACS or [employer].    Sarah please advise.

## 2022-06-30 NOTE — Telephone Encounter (Signed)
Patient returning call for results 

## 2022-07-03 ENCOUNTER — Ambulatory Visit: Payer: Medicare Other | Attending: Cardiology | Admitting: Cardiology

## 2022-07-03 ENCOUNTER — Encounter: Payer: Self-pay | Admitting: Cardiology

## 2022-07-03 VITALS — BP 118/70 | HR 96 | Ht 65.0 in | Wt 197.6 lb

## 2022-07-03 DIAGNOSIS — I2510 Atherosclerotic heart disease of native coronary artery without angina pectoris: Secondary | ICD-10-CM

## 2022-07-03 DIAGNOSIS — R079 Chest pain, unspecified: Secondary | ICD-10-CM

## 2022-07-03 DIAGNOSIS — I10 Essential (primary) hypertension: Secondary | ICD-10-CM

## 2022-07-03 DIAGNOSIS — E782 Mixed hyperlipidemia: Secondary | ICD-10-CM | POA: Diagnosis not present

## 2022-07-03 MED ORDER — FUROSEMIDE 40 MG PO TABS: 40.0000 mg | ORAL_TABLET | Freq: Every day | ORAL | 11 refills | Status: DC | PRN

## 2022-07-03 NOTE — Telephone Encounter (Signed)
I have attempted to call the patient with the results of their  Low Dose CT Chest Lung cancer screening scan. There was no answer. I have left a HIPPA compliant VM requesting the patient call the office for the scan results. I included the office contact information in the message. We will await his return call. If no return call we will continue to call until patient is contacted.    Pt. Did return call after first message, but when return called there was no answer. I have left another message.

## 2022-07-03 NOTE — Progress Notes (Signed)
Clinical Summary Randy Hebert is a 56 y.o.male seen today for follow up of the following medical problems.      1. CAD - admit 01/2015 with lateral STEMI, received DES to D1. There was a 70% ramus and 40% mid RCA that was medically managed - 01/2015 echo LVEF 40%, lateral wall hypokinesis - no beta blocker due to history of sinus pause and bradycardia. No ACE-I given history of severe angioedema requiring intubation, no ARB due to risk of cross reactivity - admit 12/2015 with atypical chest pain, worst with palpatoin and movement. Had done some heavy lifting the day it started, negative workup for ACS.  Since that time no recurrent symptoms     04/2017 PCI with stent.  -07/2018 nuclear stress prior infarct, no current ischemia     - no recent chest pains. Chronic SOB/DOE he attributes to his COPD, has had some allergies, hot weather affects.   -  Breathing is improving over time -compliant with meds.  - feels hot and flushed, nauseous at times, some SOB. Improves about 5 min after NG - episdoes about twice every 2 weeks. Some increase in frequency. Can occur at rest or with activity - can walk 2 flights of stairs then SOB.    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 SOB/COPD - 07/2015 PFTs: mild to mod ventilatory defect with small airway obstruction.   - 07/2015 CT PE no PE, though suboptimal study - 07/2015 CXR no acute process.   PFTs with mild to mod vent defect, ABGs showed borderline resting hypoxemia.     - followed by Dr [name].      3. Hyperlipidemia  - endocrine lowered atorva from 80 to 53m due to elevated LFTs.      04/2021 TC 124 TG 118 HDL 28 LDL 74 - he is on atorvastatin   01/2022 nuclear stress: LDL 86  -mixed compliant with lipitor   4. Syncope - nocturnal bradycardia noted on previous monitors, no significant daytime episodes - has loop recorder, followed by EP - no  beta blockers due to sinus pauses.      -Jan 2022 loop recorder no events - no recent syncope. Intermittent dizzy spells, sometimes with standing or bending over - takes lasix only 1-2 times per month. On chlorthaldione 12.56mdaily   - no recent syncope     5. HTN -compliant with meds       6. OSA  -using cpap machine.   7. Preoperative evaluation - needing neck surgery   SH: within last few years his son overdosed, passed away Girlfriend passed on Thanksgiving. THey lived together, he is working on moving.  Currently in low income apartment Past Medical History:  Diagnosis Date   ACE inhibitor-aggravated angioedema    Allergy    Angio-edema    Anxiety    Arthritis    Asthma    hip replacement   Back pain    Bradycardia 04/28/2017   Bulging of cervical intervertebral disc    CAD (coronary artery disease)    lateral STEMI 02/06/2015 00% D1 occlusion treated with Promus Premier 2.5 mm x 16 mm DES, 70% ramus stenosis, 40% mid RCA stenosis, 45% distal RCA stenosis, EF 45-50%   CHF (congestive heart failure) (HCC)    COPD (chronic obstructive pulmonary disease) ([facility]   Depression    Diabetes mellitus without complication ([place]  Difficult intubation    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   Dry eye    Dyspnea    Early satiety 09/23/2016   GERD (gastroesophageal reflux disease)    HCAP (healthcare-associated pneumonia) 05/15/2017   Headache    Heart murmur    Hip pain    Hyperlipidemia    Hypertension    Lobar pneumonia ([place]) 05/15/2017   Melena 08/04/2018   MI (myocardial infarction) [facility])    Myocardial infarction [facility])    Neck pain    Non-ST elevation (NSTEMI) myocardial infarction ([place]) 04/27/2017   NSTEMI (non-ST elevated myocardial infarction) ([place]) 04/26/2017   Otitis media    Pleurisy    Pneumonia due to COVID-19 virus 10/07/2019   Rectal bleeding 11/08/2018   Right shoulder pain 03/20/2020   Sinus pause    9 sec sinus pause on telemetry after  started on coreg after MI, avoid AV nodal blocking agent   Sleep apnea    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 Longest one 9 sec. No AV nodal agent   Lisinopril Anaphylaxis, Shortness Of Breath and Swelling    Angioedema, required intubation and mechanical ventilation   Chantix [Varenicline] Other (See Comments)    Nightmares and unable to sleep well   Amoxicillin Nausea Only    Did it involve swelling of the face/tongue/throat, SOB, or low BP? No Did it involve sudden or severe rash/hives, skin peeling, or any reaction on the inside of your mouth or nose? No Did you need to seek medical attention at a hospital or doctor's office? No When did it last happen? childhood reaction      If all above answers are "NO", may proceed with cephalosporin use.      Current Outpatient Medications  Medication Sig Dispense Refill   acetaminophen (TYLENOL) 500 MG tablet Take 2 tablets (1,000 mg total) by mouth every 8 (eight) hours as needed for mild pain or fever.     albuterol (PROVENTIL) (2.5 MG/3ML) 0.083% nebulizer solution Take 3 mLs (2.5 mg total) by nebulization every 6 (six) hours as needed for wheezing or shortness of breath. 150 mL 1   albuterol (VENTOLIN HFA) 108 (90 Base) MCG/ACT inhaler INHALE 2 PUFFS INTO THE LUNGS EVERY 6 HOURS AS NEEDEDFOR SHORTNESS OF BREATH OR WHEEZING. 18 g 1   amLODipine (NORVASC) 10 MG tablet TAKE 1 TABLET BY MOUTH ONCE A DAY. 90 tablet 0   ascorbic acid (VITAMIN C) 500 MG tablet Take 1 tablet (500 mg total) by mouth daily. 30 tablet 1   aspirin 81 MG EC tablet Take 1 tablet (81 mg total) by mouth  daily. (Patient taking differently: Take 81 mg by mouth in the morning and at bedtime.) 30 tablet    atorvastatin (LIPITOR) 80 MG tablet Take 1 tablet by mouth once daily 90 tablet 0   azelastine (ASTELIN) 0.1 % nasal spray USE 2 SPRAYS IN EACH NOSTRIL TWICE DAILY. 30 mL 11   Blood Glucose Monitoring Suppl (ACCU-CHEK GUIDE ME) w/Device KIT 1 Piece by Does not apply route as directed. 1 kit 0   budesonide-formoterol (SYMBICORT) 160-4.5 MCG/ACT inhaler  Take 2 puffs first thing in am and then another 2 puffs about 12 hours later. 1 each 12   Cholecalciferol (VITAMIN D3) 125 MCG (5000 UT) CAPS Take 1 capsule (5,000 Units total) by mouth daily. 90 capsule 0   citalopram (CELEXA) 20 MG tablet Take 1 tablet (20 mg total) by mouth daily. 90 tablet 3   cycloSPORINE (RESTASIS) 0.05 % ophthalmic emulsion Place 1 drop into both eyes 2 (two) times daily.     Dulaglutide (TRULICITY) 1.5 PH/1.5AV SOPN INJECT 1.5 MG INTO THE SKIN ONCE WEEKLY. 2 mL 2   FARXIGA 10 MG TABS tablet TAKE ONE TABLET BY MOUTH ONCE DAILY. 30 tablet 11   ferrous sulfate 325 (65 FE) MG tablet Take 1 tablet (325 mg total) by mouth daily with breakfast. 90 tablet 1   furosemide (LASIX) 40 MG tablet Take 1 tablet (40 mg total) by mouth daily as needed for fluid. 30 tablet 11   gabapentin (NEURONTIN) 300 MG capsule Take by mouth.     glipiZIDE (GLUCOTROL XL) 5 MG 24 hr tablet TAKE 1 TABLET BY MOUTH DAILY WITH BREAKFAST. 90 tablet 0   glucose blood (ACCU-CHEK GUIDE) test strip Use to test glucose 2 times daily. 100 each 2   lactulose (CHRONULAC) 10 GM/15ML solution Take 15 mLs (10 g total) by mouth daily. 473 mL 6   magnesium gluconate (MAGONATE) 500 MG tablet Take 500 mg by mouth 2 (two) times daily.     metFORMIN (GLUCOPHAGE) 500 MG tablet TAKE 1 TABLET BY MOUTH TWICE DAILY WITH MEALS. 60 tablet 3   mometasone (ELOCON) 0.1 % cream Apply 1 application topically daily as needed (eczema on ears).     nicotine (QC NICOTINE TRANSDERMAL SYSTEM) 14  mg/24hr patch Place 1 patch (14 mg total) onto the skin daily. (Patient not taking: Reported on 06/25/2022) 28 patch 2   nitroGLYCERIN (NITROSTAT) 0.4 MG SL tablet Place 1 tablet (0.4 mg total) under the tongue every 5 (five) minutes as needed for chest pain. (Patient not taking: Reported on 06/25/2022) 25 tablet 3   olopatadine (PATANOL) 0.1 % ophthalmic solution Place 1 drop into both eyes 2 (two) times daily.     pantoprazole (PROTONIX) 40 MG tablet TAKE (1) TABLET BY MOUTH TWICE DAILY BEFORE A MEAL. 180 tablet 3   traMADol (ULTRAM) 50 MG tablet Take 1 tablet (50 mg total) by mouth every 8 (eight) hours as needed for moderate pain or severe pain. 15 tablet 0   No current facility-administered medications for this visit.     Past Surgical History:  Procedure Laterality Date   BIOPSY  10/09/2016   Procedure: BIOPSY;  Surgeon: Daneil Dolin, MD;  [facility];  Service: Endoscopy;;   BIOPSY  11/28/2019   Procedure: BIOPSY;  Surgeon: Daneil Dolin, MD;  [facility];  Service: Endoscopy;;   CARDIAC CATHETERIZATION N/A 02/06/2015   Procedure: Left Heart Cath and Coronary Angiography;  Surgeon: Leonie Man, MD;  Location: [place] [facility];  Service: Cardiovascular;  Laterality: N/A;   CARDIAC CATHETERIZATION N/A 02/06/2015   Procedure: Coronary Stent Intervention;  Surgeon: Leonie Man, MD;  Location: [place] [facility];  Service: Cardiovascular;  Laterality: N/A;   COLONOSCOPY WITH PROPOFOL N/A 10/09/2016   Sigmoid and descending colon diverticulosis, four 4-6 mm polyps in sigmoid, one 4 mm polyp in descending. Tubular adenomas and hyperplastic. 5 year surveillance.    COLONOSCOPY WITH PROPOFOL N/A 11/24/2016   Sigmoid and descending colon diverticulosis,  four 4-6 mm polyps in sigmoid, one 4 mm polyp in descending. Tubular adenomas and hyperplastic. 5 year surveillance.    COLONOSCOPY WITH PROPOFOL N/A 10/18/2021   [place]: nonbleeding internal hemorrhoids,  small and large mouth diverticula found in the sigmoid, descending, transverse colon.  A 9 mm sessile polyp was found in the ascending colon that was removed.  4 sessile polyps found in the sigmoid, descending, transverse colon 3 to 5 mm in size, examining otherwise.  Path revealed tubular adenomas.  Repeat due in 5 years for surveillance.   CORONARY ANGIOPLASTY WITH STENT PLACEMENT  01/2015   CORONARY STENT INTERVENTION N/A 04/27/2017   Procedure: CORONARY STENT INTERVENTION;  Surgeon: Nelva Bush, MD;  Location: [place] [facility];  Service: Cardiovascular;  Laterality: N/A;   ELECTROPHYSIOLOGY STUDY N/A 06/29/2017   Procedure: ELECTROPHYSIOLOGY STUDY;  Surgeon: Evans Lance, MD;  Location: [place] [facility];  Service: Cardiovascular;  Laterality: N/A;   ESOPHAGOGASTRODUODENOSCOPY (EGD) WITH PROPOFOL N/A 10/09/2016   Dr. Gala Romney: LA grade a esophagitis.  Barrett's esophagus, biopsy-proven.  Small hiatal hernia.  EGD February 2019   ESOPHAGOGASTRODUODENOSCOPY (EGD) WITH PROPOFOL N/A 11/28/2019    salmon-colored esophageal mucosa (Barrett's) small hiatal hernia, portal hypertensive gastropathy, normal duodenum, 3 year surveillance   ESOPHAGOGASTRODUODENOSCOPY (EGD) WITH PROPOFOL N/A 09/06/2021   three columns of grade 1 varices in distal esophagus, no stigmata of bleeding or red wale signs. Small hiatal hernia. Mild portal gastropathy. Normal duodenum. Repeat upper endoscopy in 1 year for surveillance.   INCISION / DRAINAGE HAND / FINGER     LEFT HEART CATH AND CORONARY ANGIOGRAPHY N/A 04/27/2017   Procedure: LEFT HEART CATH AND CORONARY ANGIOGRAPHY;  Surgeon: Nelva Bush, MD;  Location: [place] [facility];  Service: Cardiovascular;  Laterality: N/A;   LOOP RECORDER INSERTION  06/29/2017   Procedure: Loop Recorder Insertion;  Surgeon: Evans Lance, MD;  Location: [place] [facility];  Service: Cardiovascular;;   POLYPECTOMY  11/24/2016   Procedure: POLYPECTOMY;  Surgeon: Daneil Dolin, MD;  [facility];  Service: Endoscopy;;  descending and sigmoid   POLYPECTOMY  10/18/2021   Procedure: POLYPECTOMY;  Surgeon: Eloise Harman, DO;  [facility];  Service: Endoscopy;;   TOTAL HIP ARTHROPLASTY Right 07/01/2016   TOTAL HIP ARTHROPLASTY Right 07/01/2016   Procedure: RIGHT TOTAL HIP ARTHROPLASTY ANTERIOR APPROACH;  Surgeon: Mcarthur Rossetti, MD;  Location: [facility];  Service: Orthopedics;  Laterality: Right;     Allergies  Allergen Reactions   Carvedilol Other (See Comments)    Sinus pause on telemetry >3 seconds. Longest one 9 sec. No AV nodal agent   Lisinopril Anaphylaxis, Shortness Of Breath and Swelling    Angioedema, required intubation and mechanical ventilation   Chantix [Varenicline] Other (See Comments)    Nightmares and unable to sleep well   Amoxicillin Nausea Only    Did it involve swelling of the face/tongue/throat, SOB, or low BP? No Did it involve sudden or severe rash/hives, skin peeling, or any reaction on the inside of your mouth or nose? No Did you need to seek medical attention at a hospital or doctor's office? No When did it last happen? childhood reaction      If all above answers are "NO", may proceed with cephalosporin use.       Family History  Problem Relation Age of Onset   Heart attack Father    Stroke Father    Arthritis Father    Heart disease Father  Cancer Mother        ???   Arthritis Mother    Heart disease Brother 59       died in sleep   Early death Brother    Diabetes Maternal Uncle    Alzheimer's disease Maternal Grandmother      Social History Mr. Kranz reports that he has been smoking cigarettes. He has a 46.00 pack-year smoking history. He has quit using smokeless tobacco.  His smokeless tobacco use included chew. Mr. Sheehan reports that he does not currently use alcohol.   Review of Systems CONSTITUTIONAL: No weight loss, fever, chills, weakness or fatigue.  HEENT: Eyes:  No visual loss, blurred vision, double vision or yellow sclerae.No hearing loss, sneezing, congestion, runny nose or sore throat.  SKIN: No rash or itching.  CARDIOVASCULAR: per hpi RESPIRATORY: No shortness of breath, cough or sputum.  GASTROINTESTINAL: No anorexia, nausea, vomiting or diarrhea. No abdominal pain or blood.  GENITOURINARY: No burning on urination, no polyuria NEUROLOGICAL: No headache, dizziness, syncope, paralysis, ataxia, numbness or tingling in the extremities. No change in bowel or bladder control.  MUSCULOSKELETAL: No muscle, back pain, joint pain or stiffness.  LYMPHATICS: No enlarged nodes. No history of splenectomy.  PSYCHIATRIC: No history of depression or anxiety.  ENDOCRINOLOGIC: No reports of sweating, cold or heat intolerance. No polyuria or polydipsia.  Marland Kitchen   Physical Examination Today's Vitals   07/03/22 1355  BP: 118/70  Pulse: 96  SpO2: 94%  Weight: 197 lb 9.6 oz (89.6 kg)  Height: _0  (1.651 m)   Body mass index is 32.88 kg/m.   Gen: resting comfortably, no acute distress HEENT: no scleral icterus, pupils equal round and reactive, no palptable cervical adenopathy,  CV: RRR, no mr/g no jvd Resp: Clear to auscultation bilaterally GI: abdomen is soft, non-tender, non-distended, normal bowel sounds, no hepatosplenomegaly MSK: extremities are warm, no edema.  Skin: warm, no rash Neuro:  no focal deficits Psych: appropriate affect   Diagnostic Studies  01/2015 cath 1st Diag lesion, 100% stenosed. A Promus Premier 2.5 mm x 16 mm drug-eluting stent was placed. There is a 0% residual stenosis post intervention. Ramus lesion, 70% stenosed. Mid RCA lesion, 40% stenosed. Dist RCA lesion, 45% stenosed. Mild to moderately reduced LVEF with anterolateral and apical hypokinesis and elevated LVEDP   Abnormal anatomy with equal sized Diagonal and LAD Anjulie Dipierro but extensive RCA. Successful PCI of the First Diagonal Koreen Lizaola.   Recommendations: Standard  post radial cath/PCI TR band removal Dual independent therapy for minimum one year. Check 2-D echocardiogram to better assess EF Add statin and beta blocker. Smoking cessation counseling. Cardiac Rehabilitation And Case Management Consultation If hemodynamic stable, would consider fast-track discharge Would consider noninvasive evaluation of the Ramus Intermedius lesion in the absence of any recurrent anginal pain.   01/2015 echo Study Conclusions  - Left ventricle: Moderately severe hypokinesis of mid   anterolateral segment, apical lateral segment, mid/apical   anterior segments. EF is 40%. The cavity size was normal. Wall   thickness was increased in a pattern of mild LVH. - Aortic valve: Sclerosis without stenosis. There was no   significant regurgitation. - Right ventricle: The cavity size was normal. Systolic function   was normal.   07/2015 CT PE IMPRESSION: 1. Suboptimal contrast bolus timing in the pulmonary arterial tree, poor in the lobar vessels other than the right lower lobe. No central pulmonary embolus. If there is continued suspicion of acute PE nuclear medicine V/Q scan may be most  helpful. 2. Situs abnormality in the chest with right side aortic arch and descending aorta. Mirror image branching. Other visible situs including cardiac and upper abdominal situs appear normal. 3. Calcified Coronary artery atherosclerosis. Hepatomegaly with fatty liver disease.   07/2015 PFTs: mild to mod ventilatory defect with small airway obstruction, moderately reduced DLCO.    07/2018 nuclear stress There was no ST segment deviation noted during stress. Defect 1: There is a medium defect of moderate severity present in the mid anterior, mid anterolateral and apical anterior location. Findings consistent with prior myocardial infarction. This is an intermediate risk study. Nuclear stress EF: 53%.     Assessment and Plan  1. CAD/ICM -recent unclear episodes of feeling hot,  flushed, sweaty, SOB. No chest pain but symptoms improved with NG. Symptoms increasing in frequency. Certaintly with his DM2 could be exerpiencing atypical angina - plan for lexiscan.  - severe reaction to ACE with angioedema requiring emergency intubation. Have avoided ARBs for this reason, though low risk for cross reactivity given the severity of his prior reaction have not tried.      2. Hyperlipideima - above goal LDL but mixed compliance with atorvastitn, encouraged increased compliance.    3. HTN - at goal, continue current meds   4. History of syncope/bradycardia - loop recorder monitored by EP - no recent episodes, continue to monitor.  - avoid all AV nodal agents  - continue to monitor     Arnoldo Lenis, M.D.,

## 2022-07-03 NOTE — Patient Instructions (Signed)
Medication Instructions:  Your physician recommends that you continue on your current medications as directed. Please refer to the Current Medication list given to you today.   Labwork: None  Testing/Procedures: Your physician has requested that you have a lexiscan myoview. For further information please visit [email or web address]. Please follow instruction sheet, as given.   Follow-Up: Follow up with Dr. [name] in 6 months.   Any Other Special Instructions Will Be Listed Below (If Applicable).     If you need a refill on your cardiac medications before your next appointment, please call your pharmacy.

## 2022-07-04 ENCOUNTER — Telehealth: Payer: Self-pay | Admitting: Acute Care

## 2022-07-04 NOTE — Telephone Encounter (Signed)
I have attempted to call the patient with the results of their  Low Dose CT Chest Lung cancer screening scan. There was no answer. I have left a HIPPA compliant VM requesting the patient call the office for the scan results. I included the office contact information in the message. We will await his return call. If no return call we will continue to call until patient is contacted.

## 2022-07-07 NOTE — Telephone Encounter (Signed)
See other telephone note from 07/04/2022.  

## 2022-07-08 ENCOUNTER — Telehealth: Payer: Self-pay | Admitting: Acute Care

## 2022-07-08 ENCOUNTER — Other Ambulatory Visit: Payer: Self-pay | Admitting: Acute Care

## 2022-07-08 DIAGNOSIS — R911 Solitary pulmonary nodule: Secondary | ICD-10-CM

## 2022-07-08 NOTE — Telephone Encounter (Signed)
I have called the patient with the results of his screening scan.  I explained that his scan was read as a lung RADS 4B and that there is a 10.3 millimeter nodule in the right upper lobe that is new and was not on his previous scan.  Recommendation is for a PET scan to better evaluate the nodule. There was also notation of severe left main and three-vessel coronary artery calcifications.  The patient is scheduled for a nuclear med myocardial study. Denise I have placed the order for the PET scan, he will need follow-up with either Dr. Valeta Harms, Lamonte Sakai, or [facility] after the scan to review the results and determine need for biopsy.  Placed on tickle list in the event this is a nonconcerning nodule so he can return to the regular screening population Please let PCP know of abnormal finding in the plan is for PET scan and follow-up with pulmonary MD Dr. Melvyn Novas I wanted to make sure you were aware as you see this patient in [place]. Let me know if you have any questions or concerns. Thanks all.

## 2022-07-08 NOTE — Telephone Encounter (Signed)
Attempted to call pt and inform him about his pet scan and follow up appt scheduled. I left a vm for him to return my call.

## 2022-07-08 NOTE — Telephone Encounter (Signed)
Results faxed to PCP with plan for PET/OV

## 2022-07-09 NOTE — Telephone Encounter (Signed)
See other telephone note from 07/08/22.

## 2022-07-10 ENCOUNTER — Other Ambulatory Visit (HOSPITAL_COMMUNITY): Payer: Medicare Other

## 2022-07-15 ENCOUNTER — Ambulatory Visit (INDEPENDENT_AMBULATORY_CARE_PROVIDER_SITE_OTHER): Payer: Medicare Other | Admitting: Gastroenterology

## 2022-07-15 ENCOUNTER — Encounter: Payer: Self-pay | Admitting: Gastroenterology

## 2022-07-15 VITALS — BP 123/74 | HR 68 | Temp 97.3°F | Ht 65.0 in | Wt 199.0 lb

## 2022-07-15 DIAGNOSIS — K7030 Alcoholic cirrhosis of liver without ascites: Secondary | ICD-10-CM

## 2022-07-15 DIAGNOSIS — K2270 Barrett's esophagus without dysplasia: Secondary | ICD-10-CM

## 2022-07-15 MED ORDER — LACTULOSE 10 GM/15ML PO SOLN: 10.0000 g | Freq: Every day | ORAL | 6 refills | Status: DC

## 2022-07-15 NOTE — Patient Instructions (Addendum)
I have refilled lactulose for you!  We will order an ultrasound in January 2024.  We will mail you labs to have done in January 2024!  Your next endoscopy will be in January 2024! Please hold Trulicity dose one week prior to endoscopy. Hold Farxiga 72 hours prior to endoscopy. No glipizide or metformin day or procedure.   It is important to avoid alcohol completely.   Have a great holiday season!  I enjoyed seeing you again today! As you know, I value our relationship and want to provide genuine, compassionate, and quality care. I welcome your feedback. If you receive a survey regarding your visit,  I greatly appreciate you taking time to fill this out. See you next time!  Annitta Needs, PhD, ANP-BC [facility]

## 2022-07-15 NOTE — Progress Notes (Signed)
Gastroenterology Office Note     Primary Care Physician:  Coral Spikes, DO  Primary Gastroenterologist: Dr. Gala Romney    Chief Complaint   Chief Complaint  Patient presents with   Follow-up    Follow up on cirrhosis     History of Present Illness   Randy Hebert is a 56 y.o. 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 Received blood transfusion. EGD while inpatient with three columns of Grade 1 varices in distal esophagus but no stigmata of bleeding or red wale signs. Small hiatal hernia. Mild portal gastropathy. 1 year surveillance needed. colonoscopy October 18, 2021 with nonbleeding internal hemorrhoids, small and large mouth diverticula found in the sigmoid, descending, transverse colon.  A 9 mm sessile polyp was found in the ascending colon that was removed.  4 sessile polyps found in the sigmoid, descending, transverse colon 3 to 5 mm in size, examining otherwise.  Path revealed tubular adenomas.  Repeat due in 5 years for surveillance.    RUQ [place] last in July 2023. Due for surveillance in Jan 2024. Needs refill on lactulose. Concern for encephalopathy during prior admission but no issues since then.   Has a wine cooler "here and there". No abdominal pain. Continues on pantoprazole BID. GERD controlled. No dysphagia. Needs refill on lactulose.      Past Medical History:  Diagnosis Date   ACE inhibitor-aggravated angioedema    Allergy    Angio-edema    Anxiety    Arthritis    Asthma    hip replacement   Back pain    Bradycardia 04/28/2017   Bulging of cervical intervertebral disc    CAD (coronary artery disease)    lateral STEMI 02/06/2015 00% D1 occlusion treated with Promus Premier 2.5 mm x 16 mm DES, 70% ramus stenosis, 40% mid RCA stenosis, 45% distal RCA stenosis, EF 45-50%   CHF (congestive heart failure) (HCC)    COPD (chronic obstructive pulmonary disease) ([place])     Depression    Diabetes mellitus without complication ([place])    Difficult intubation    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   Dry eye    Dyspnea    Early satiety 09/23/2016   GERD (gastroesophageal reflux disease)    HCAP (healthcare-associated pneumonia) 05/15/2017   Headache    Heart murmur    Hip pain    Hyperlipidemia    Hypertension    Lobar pneumonia ([place]) 05/15/2017   Melena 08/04/2018   MI (myocardial infarction) ([place])    Myocardial infarction ([place])    Neck pain    Non-ST elevation (NSTEMI) myocardial infarction ([place]) 04/27/2017   NSTEMI (non-ST elevated myocardial infarction) ([place]) 04/26/2017   Otitis media    Pleurisy    Pneumonia due to COVID-19 virus 10/07/2019   Rectal bleeding 11/08/2018   Right shoulder pain 03/20/2020   Sinus pause    9 sec sinus pause on telemetry after started on coreg after MI, avoid AV nodal blocking agent   Sleep apnea    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   STEMI (ST elevation myocardial infarction) ([place]) 05/31/2015   Substance abuse ([place]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   Syncope 16/06/9603   Syncope and collapse 05/15/2017   Transaminitis 08/04/2018   Unilateral primary osteoarthritis, right hip 07/01/2016    Past Surgical History:  Procedure Laterality Date   BIOPSY  10/09/2016  Procedure: BIOPSY;  Surgeon: Daneil Dolin, MD;  [facility];  Service: Endoscopy;;   BIOPSY  11/28/2019   Procedure: BIOPSY;  Surgeon: Daneil Dolin, MD;  [facility];  Service: Endoscopy;;   CARDIAC CATHETERIZATION N/A 02/06/2015   Procedure: Left Heart Cath and Coronary Angiography;  Surgeon: Leonie Man, MD;  Location: [facility];  Service: Cardiovascular;  Laterality: N/A;   CARDIAC CATHETERIZATION N/A 02/06/2015   Procedure: Coronary Stent Intervention;  Surgeon: Leonie Man, MD;  Location: [facility];  Service: Cardiovascular;  Laterality: N/A;   COLONOSCOPY WITH PROPOFOL N/A 10/09/2016    Sigmoid and descending colon diverticulosis, four 4-6 mm polyps in sigmoid, one 4 mm polyp in descending. Tubular adenomas and hyperplastic. 5 year surveillance.    COLONOSCOPY WITH PROPOFOL N/A 11/24/2016   Sigmoid and descending colon diverticulosis, four 4-6 mm polyps in sigmoid, one 4 mm polyp in descending. Tubular adenomas and hyperplastic. 5 year surveillance.    COLONOSCOPY WITH PROPOFOL N/A 10/18/2021   [place]: nonbleeding internal hemorrhoids, small and large mouth diverticula found in the sigmoid, descending, transverse colon.  A 9 mm sessile polyp was found in the ascending colon that was removed.  4 sessile polyps found in the sigmoid, descending, transverse colon 3 to 5 mm in size, examining otherwise.  Path revealed tubular adenomas.  Repeat due in 5 years for surveillance.   CORONARY ANGIOPLASTY WITH STENT PLACEMENT  01/2015   CORONARY STENT INTERVENTION N/A 04/27/2017   Procedure: CORONARY STENT INTERVENTION;  Surgeon: Nelva Bush, MD;  Location: [facility];  Service: Cardiovascular;  Laterality: N/A;   ELECTROPHYSIOLOGY STUDY N/A 06/29/2017   Procedure: ELECTROPHYSIOLOGY STUDY;  Surgeon: Evans Lance, MD;  Location: [facility];  Service: Cardiovascular;  Laterality: N/A;   ESOPHAGOGASTRODUODENOSCOPY (EGD) WITH PROPOFOL N/A 10/09/2016   Dr. Gala Romney: LA grade a esophagitis.  Barrett's esophagus, biopsy-proven.  Small hiatal hernia.  EGD February 2019   ESOPHAGOGASTRODUODENOSCOPY (EGD) WITH PROPOFOL N/A 11/28/2019    salmon-colored esophageal mucosa (Barrett's) small hiatal hernia, portal hypertensive gastropathy, normal duodenum, 3 year surveillance   ESOPHAGOGASTRODUODENOSCOPY (EGD) WITH PROPOFOL N/A 09/06/2021   three columns of grade 1 varices in distal esophagus, no stigmata of bleeding or red wale signs. Small hiatal hernia. Mild portal gastropathy. Normal duodenum. Repeat upper endoscopy in 1 year for surveillance.   INCISION / DRAINAGE HAND / FINGER      LEFT HEART CATH AND CORONARY ANGIOGRAPHY N/A 04/27/2017   Procedure: LEFT HEART CATH AND CORONARY ANGIOGRAPHY;  Surgeon: Nelva Bush, MD;  Location: [facility];  Service: Cardiovascular;  Laterality: N/A;   LOOP RECORDER INSERTION  06/29/2017   Procedure: Loop Recorder Insertion;  Surgeon: Evans Lance, MD;  Location: [facility];  Service: Cardiovascular;;   POLYPECTOMY  11/24/2016   Procedure: POLYPECTOMY;  Surgeon: Daneil Dolin, MD;  [facility];  Service: Endoscopy;;  descending and sigmoid   POLYPECTOMY  10/18/2021   Procedure: POLYPECTOMY;  Surgeon: Eloise Harman, DO;  [facility];  Service: Endoscopy;;   TOTAL HIP ARTHROPLASTY Right 07/01/2016   TOTAL HIP ARTHROPLASTY Right 07/01/2016   Procedure: RIGHT TOTAL HIP ARTHROPLASTY ANTERIOR APPROACH;  Surgeon: Mcarthur Rossetti, MD;  Location: [facility];  Service: Orthopedics;  Laterality: Right;    Current Outpatient Medications  Medication Sig Dispense Refill   acetaminophen (TYLENOL) 500 MG tablet Take 2 tablets (1,000 mg total) by mouth every 8 (  eight) hours as needed for mild pain or fever.     albuterol (PROVENTIL) (2.5 MG/3ML) 0.083% nebulizer solution Take 3 mLs (2.5 mg total) by nebulization every 6 (six) hours as needed for wheezing or shortness of breath. 150 mL 1   albuterol (VENTOLIN HFA) 108 (90 Base) MCG/ACT inhaler INHALE 2 PUFFS INTO THE LUNGS EVERY 6 HOURS AS NEEDEDFOR SHORTNESS OF BREATH OR WHEEZING. 18 g 1   amLODipine (NORVASC) 10 MG tablet TAKE 1 TABLET BY MOUTH ONCE A DAY. 90 tablet 0   ascorbic acid (VITAMIN C) 500 MG tablet Take 1 tablet (500 mg total) by mouth daily. 30 tablet 1   aspirin 81 MG EC tablet Take 1 tablet (81 mg total) by mouth daily. (Patient taking differently: Take 81 mg by mouth in the morning and at bedtime.) 30 tablet    atorvastatin (LIPITOR) 80 MG tablet Take 1 tablet by mouth once daily 90 tablet 0   azelastine (ASTELIN) 0.1 % nasal spray USE  2 SPRAYS IN EACH NOSTRIL TWICE DAILY. 30 mL 11   Blood Glucose Monitoring Suppl (ACCU-CHEK GUIDE ME) w/Device KIT 1 Piece by Does not apply route as directed. 1 kit 0   budesonide-formoterol (SYMBICORT) 160-4.5 MCG/ACT inhaler Take 2 puffs first thing in am and then another 2 puffs about 12 hours later. 1 each 12   Cholecalciferol (VITAMIN D3) 125 MCG (5000 UT) CAPS Take 1 capsule (5,000 Units total) by mouth daily. 90 capsule 0   citalopram (CELEXA) 20 MG tablet Take 1 tablet (20 mg total) by mouth daily. 90 tablet 3   cycloSPORINE (RESTASIS) 0.05 % ophthalmic emulsion Place 1 drop into both eyes 2 (two) times daily.     Dulaglutide (TRULICITY) 1.5 EH/6.3JS SOPN INJECT 1.5 MG INTO THE SKIN ONCE WEEKLY. 2 mL 2   FARXIGA 10 MG TABS tablet TAKE ONE TABLET BY MOUTH ONCE DAILY. 30 tablet 11   ferrous sulfate 325 (65 FE) MG tablet Take 1 tablet (325 mg total) by mouth daily with breakfast. 90 tablet 1   furosemide (LASIX) 40 MG tablet Take 1 tablet (40 mg total) by mouth daily as needed for fluid. 30 tablet 11   glipiZIDE (GLUCOTROL XL) 5 MG 24 hr tablet TAKE 1 TABLET BY MOUTH DAILY WITH BREAKFAST. 90 tablet 0   glucose blood (ACCU-CHEK GUIDE) test strip Use to test glucose 2 times daily. 100 each 2   magnesium gluconate (MAGONATE) 500 MG tablet Take 500 mg by mouth 2 (two) times daily.     metFORMIN (GLUCOPHAGE) 500 MG tablet TAKE 1 TABLET BY MOUTH TWICE DAILY WITH MEALS. 60 tablet 3   mometasone (ELOCON) 0.1 % cream Apply 1 application topically daily as needed (eczema on ears).     olopatadine (PATANOL) 0.1 % ophthalmic solution Place 1 drop into both eyes 2 (two) times daily.     pantoprazole (PROTONIX) 40 MG tablet TAKE (1) TABLET BY MOUTH TWICE DAILY BEFORE A MEAL. 180 tablet 3   lactulose (CHRONULAC) 10 GM/15ML solution Take 15 mLs (10 g total) by mouth daily. 473 mL 6   No current facility-administered medications for this visit.    Allergies as of 07/15/2022 - Review Complete 07/15/2022   Allergen Reaction Noted   Carvedilol Other (See Comments) 02/08/2015   Lisinopril Anaphylaxis, Shortness Of Breath, and Swelling 05/30/2015   Chantix [varenicline] Other (See Comments) 07/16/2021   Amoxicillin Nausea Only 02/06/2015    Family History  Problem Relation Age of Onset   Heart attack Father  Stroke Father    Arthritis Father    Heart disease Father    Cancer Mother        ???   Arthritis Mother    Heart disease Brother 26       died in sleep   Early death Brother    Diabetes Maternal Uncle    Alzheimer's disease Maternal Grandmother     Social History   Socioeconomic History   Marital status: Divorced    Spouse name: alice   Number of children: 3   Years of education: 12   Highest education level: Not on file  Occupational History   Occupation: SSI  Tobacco Use   Smoking status: Every Day    Packs/day: 1.00    Years: 46.00    Total pack years: 46.00    Types: Cigarettes    Passive exposure: Never   Smokeless tobacco: Former    Types: Chew   Tobacco comments:    Smokes 1 pack a day [facility] 12/06/21  Vaping Use   Vaping Use: Former  Substance and Sexual Activity   Alcohol use: Not Currently    Comment: couple beers occ- stopped 09/2021   Drug use: No    Comment: history of drug use- marijuana, cocaine- quit 2014   Sexual activity: Yes    Partners: Female    Birth control/protection: None  Other Topics Concern   Not on file  Social History Narrative   Lives with girlfriend [name]   3 children, but 1 OD  In 2019.   Grandchildren-2      Two cats: may and princess; and a dog-foxy      Enjoy: spending time with pets, TV, yard work       Diet: eats all food groups   Caffeine: half and half coffee, some tea-half and half decaf   Water: 6-8 cups daily      Wears seat belt   Does not use phone while driving   Smoke detectors at home      Social Determinants of Health   Financial Resource Strain: Low Risk  (10/28/2021)   Overall Financial  Resource Strain (CARDIA)    Difficulty of Paying Living Expenses: Not hard at all  Food Insecurity: No Food Insecurity (01/23/2022)   Hunger Vital Sign    Worried About Running Out of Food in the Last Year: Never true    Lead in the Last Year: Never true  Transportation Needs: No Transportation Needs (01/23/2022)   PRAPARE - Hydrologist (Medical): No    Lack of Transportation (Non-Medical): No  Physical Activity: Inactive (10/28/2021)   Exercise Vital Sign    Days of Exercise per Week: 0 days    Minutes of Exercise per Session: 0 min  Stress: No Stress Concern Present (10/28/2021)   [place]    Feeling of Stress : Not at all  Social Connections: Moderately Integrated (10/28/2021)   Social Connection and Isolation Panel [NHANES]    Frequency of Communication with Friends and Family: More than three times a week    Frequency of Social Gatherings with Friends and Family: Twice a week    Attends Religious Services: More than 4 times per year    Active Member of [employer] or Organizations: Yes    Attends Music therapist: More than 4 times per year    Marital Status: Divorced  Human resources officer Violence: Not At Risk (  10/28/2021)   Humiliation, Afraid, Rape, and Kick questionnaire    Fear of Current or Ex-Partner: No    Emotionally Abused: No    Physically Abused: No    Sexually Abused: No     Review of Systems   Gen: Denies any fever, chills, fatigue, weight loss, lack of appetite.  CV: Denies chest pain, heart palpitations, peripheral edema, syncope.  Resp: Denies shortness of breath at rest or with exertion. Denies wheezing or cough.  GI: Denies dysphagia or odynophagia. Denies jaundice, hematemesis, fecal incontinence. GU : Denies urinary burning, urinary frequency, urinary hesitancy MS: Denies joint pain, muscle weakness, cramps, or limitation of movement.  Derm:  Denies rash, itching, dry skin Psych: Denies depression, anxiety, memory loss, and confusion Heme: Denies bruising, bleeding, and enlarged lymph nodes.   Physical Exam   BP 123/74   Pulse 68   Temp (!) 97.3 F (36.3 C)   Ht _0  (1.651 m)   Wt 199 lb (90.3 kg)   BMI 33.12 kg/m  General:   Alert and oriented. Pleasant and cooperative. Well-nourished and well-developed.  Head:  Normocephalic and atraumatic. Eyes:  Without icterus Abdomen:  +BS, distended but soft, large AP diameter, liver margin palpable several finger breadths below rib margin and to left of midline. Large ventral hernia.  Rectal:  Deferred  Msk:  Symmetrical without gross deformities. Normal posture. Extremities:  Without edema. Neurologic:  Alert and  oriented x4;  grossly normal neurologically. Skin:  Intact without significant lesions or rashes. Psych:  Alert and cooperative. Normal mood and affect.  Lab Results  Component Value Date   ALT 52 (H) 02/17/2022   AST 44 (H) 02/17/2022   ALKPHOS 144 (H) 02/17/2022   BILITOT 0.9 02/17/2022   Lab Results  Component Value Date   WBC 6.9 02/17/2022   HGB 16.1 02/17/2022   HCT 48.4 02/17/2022   MCV 83 02/17/2022   PLT 197 02/17/2022   Lab Results  Component Value Date   INR 1.0 09/06/2021   INR 1.0 09/04/2021   INR 1.1 09/03/2021      Assessment   Randy Hebert is a 56 y.o. male presenting today in follow-up with a history of 56 y.o. male presenting today in follow-up with a history of Barrett's, cirrhosis in setting of ETOH, prior encephalopathy.    Cirrhosis: continues to drink alcohol intermittently, reporting "wine cooler here and there". We discussed absolute avoidance of this. RUQ [place] due in Jan 2024 along with labs. EGD will be in Jan 2024 as well. Continue lactulose with goal of 3 BMs daily.   Encephalopathy: during admission in Jan 2023 or rectal bleeding. Had noted some disorientation prior to that admission but has done quite well since  that time. Continue lactulose. Low threshold for adding Xifaxan.   Barrett's: Continue Protonix BID. No dysphagia.   PLAN    EGD for variceal screening due in Jan 2024. ASA 3.  Labs to be mailed to patient for CBC, CMP, INR, AFP in January RUQ [place] due in Jan 2024 Continue lactulose Absolute alcohol avoidance Return in 6 months   Annitta Needs, PhD, [employer] [facility] Gastroenterology

## 2022-07-16 ENCOUNTER — Encounter (HOSPITAL_COMMUNITY): Payer: Self-pay

## 2022-07-16 ENCOUNTER — Encounter (HOSPITAL_COMMUNITY)
Admission: RE | Admit: 2022-07-16 | Discharge: 2022-07-16 | Disposition: A | Discharge status: Home / Self Care | Payer: Medicare Other | Source: Ambulatory Visit | Attending: Cardiology | Admitting: Cardiology

## 2022-07-16 ENCOUNTER — Encounter (HOSPITAL_COMMUNITY): Payer: Medicare Other

## 2022-07-16 DIAGNOSIS — R079 Chest pain, unspecified: Secondary | ICD-10-CM | POA: Insufficient documentation

## 2022-07-16 LAB — NM MYOCAR MULTI W/SPECT W/WALL MOTION / EF
LV dias vol: 138 mL (ref 62–150)
LV sys vol: 91 mL
Nuc Stress EF: 34 %
Peak HR: 82 {beats}/min
RATE: 0.5
Rest HR: 72 {beats}/min
Rest Nuclear Isotope Dose: 11 mCi
SDS: 3
SRS: 8
SSS: 11
ST Depression (mm): 0 mm
Stress Nuclear Isotope Dose: 32.8 mCi
TID: 1.18

## 2022-07-16 MED ORDER — REGADENOSON 0.4 MG/5ML IV SOLN
INTRAVENOUS | Status: AC
  Administered 2022-07-16: 0.4 mg
  Filled 2022-07-16: qty 5

## 2022-07-16 MED ORDER — TECHNETIUM TC 99M TETROFOSMIN IV KIT
30.0000 | PACK | Freq: Once | INTRAVENOUS | Status: AC | PRN
  Administered 2022-07-16: 32.8 via INTRAVENOUS

## 2022-07-16 MED ORDER — TECHNETIUM TC 99M TETROFOSMIN IV KIT
10.0000 | PACK | Freq: Once | INTRAVENOUS | Status: AC | PRN
  Administered 2022-07-16: 11 via INTRAVENOUS

## 2022-07-16 MED ORDER — SODIUM CHLORIDE FLUSH 0.9 % IV SOLN
INTRAVENOUS | Status: AC
  Administered 2022-07-16: 10 mL
  Filled 2022-07-16: qty 10

## 2022-07-17 ENCOUNTER — Ambulatory Visit (HOSPITAL_COMMUNITY)
Admission: RE | Admit: 2022-07-17 | Discharge: 2022-07-17 | Disposition: A | Discharge status: Home / Self Care | Payer: Medicare Other | Source: Ambulatory Visit | Attending: Acute Care | Admitting: Acute Care

## 2022-07-17 DIAGNOSIS — I2510 Atherosclerotic heart disease of native coronary artery without angina pectoris: Secondary | ICD-10-CM | POA: Diagnosis not present

## 2022-07-17 DIAGNOSIS — I700 Atherosclerosis of aorta: Secondary | ICD-10-CM | POA: Diagnosis not present

## 2022-07-17 DIAGNOSIS — R911 Solitary pulmonary nodule: Secondary | ICD-10-CM | POA: Insufficient documentation

## 2022-07-17 DIAGNOSIS — K5730 Diverticulosis of large intestine without perforation or abscess without bleeding: Secondary | ICD-10-CM | POA: Diagnosis not present

## 2022-07-17 DIAGNOSIS — Q2547 Right aortic arch: Secondary | ICD-10-CM | POA: Diagnosis not present

## 2022-07-17 MED ORDER — FLUDEOXYGLUCOSE F - 18 (FDG) INJECTION
10.6500 | Freq: Once | INTRAVENOUS | Status: AC | PRN
  Administered 2022-07-17: 10.65 via INTRAVENOUS

## 2022-07-30 ENCOUNTER — Telehealth: Payer: Self-pay | Admitting: Cardiology

## 2022-07-30 DIAGNOSIS — I2510 Atherosclerotic heart disease of native coronary artery without angina pectoris: Secondary | ICD-10-CM

## 2022-07-30 NOTE — Telephone Encounter (Signed)
Patient notified and verbalized understanding. Patient had no questions or concerns at this time.  

## 2022-07-30 NOTE — Telephone Encounter (Signed)
-----   Message from Laurine Blazer, LPN sent at 48/18/5909  2:32 PM EST -----  ----- Message ----- From: Arnoldo Lenis, MD Sent: 07/28/2022   7:47 AM EST To: Laurine Blazer, LPN  Stress test shows some old damage to the heart from prior blockages, no new blockages. Stress test did suggest heart pumping function has decreased, its not great at measuring this but would like to get an echo to better evaluate heart pumping function please. Please order echo for CAD.    [name] MD

## 2022-07-30 NOTE — Telephone Encounter (Signed)
Pt returning call regarding test results. Please advise ?

## 2022-07-31 ENCOUNTER — Ambulatory Visit: Payer: Medicare Other | Admitting: Student

## 2022-07-31 DIAGNOSIS — D109 Benign neoplasm of pharynx, unspecified: Secondary | ICD-10-CM | POA: Insufficient documentation

## 2022-07-31 DIAGNOSIS — M4802 Spinal stenosis, cervical region: Secondary | ICD-10-CM | POA: Diagnosis not present

## 2022-07-31 DIAGNOSIS — J383 Other diseases of vocal cords: Secondary | ICD-10-CM | POA: Diagnosis not present

## 2022-07-31 DIAGNOSIS — F17210 Nicotine dependence, cigarettes, uncomplicated: Secondary | ICD-10-CM | POA: Diagnosis not present

## 2022-07-31 DIAGNOSIS — R490 Dysphonia: Secondary | ICD-10-CM | POA: Diagnosis not present

## 2022-07-31 DIAGNOSIS — R0600 Dyspnea, unspecified: Secondary | ICD-10-CM | POA: Insufficient documentation

## 2022-08-01 DIAGNOSIS — M4712 Other spondylosis with myelopathy, cervical region: Secondary | ICD-10-CM | POA: Insufficient documentation

## 2022-08-05 ENCOUNTER — Ambulatory Visit (HOSPITAL_COMMUNITY)
Admission: RE | Admit: 2022-08-05 | Discharge: 2022-08-05 | Disposition: A | Discharge status: Home / Self Care | Payer: Medicare Other | Source: Ambulatory Visit | Attending: Cardiology | Admitting: Cardiology

## 2022-08-05 DIAGNOSIS — I2510 Atherosclerotic heart disease of native coronary artery without angina pectoris: Secondary | ICD-10-CM | POA: Diagnosis not present

## 2022-08-05 LAB — ECHOCARDIOGRAM COMPLETE
AR max vel: 2.81 cm2
AV Area VTI: 2.7 cm2
AV Area mean vel: 2.74 cm2
AV Mean grad: 5 mmHg
AV Peak grad: 10.2 mmHg
Ao pk vel: 1.6 m/s
Area-P 1/2: 2.87 cm2
Calc EF: 47.5 %
MV VTI: 2.43 cm2
S' Lateral: 3.3 cm
Single Plane A2C EF: 34.8 %
Single Plane A4C EF: 54.5 %

## 2022-08-05 NOTE — Progress Notes (Incomplete)
*  PRELIMINARY RESULTS* Echocardiogram 2D Echocardiogram has been performed.  Randy Hebert 08/05/2022, 2:42 PM

## 2022-08-06 ENCOUNTER — Telehealth: Payer: Self-pay | Admitting: Internal Medicine

## 2022-08-06 NOTE — Telephone Encounter (Signed)
January recall for RUQ U/S

## 2022-08-06 NOTE — Telephone Encounter (Signed)
Spoke with pt. He is having neck/spine surgery 09/05/22 at [facility]. How long after that will he need to wait to have EGD done? thanks

## 2022-08-06 NOTE — Telephone Encounter (Signed)
Also recall for EGD in January

## 2022-08-08 ENCOUNTER — Other Ambulatory Visit: Payer: Self-pay | Admitting: "Endocrinology

## 2022-08-08 DIAGNOSIS — E1159 Type 2 diabetes mellitus with other circulatory complications: Secondary | ICD-10-CM

## 2022-08-11 NOTE — Telephone Encounter (Signed)
Let's have him come for a visit after his neck surgery to make sure all is good to go.

## 2022-08-13 NOTE — Progress Notes (Unsigned)
Synopsis: Referred for pulmonary nodule by Coral Spikes, DO  Subjective:   PATIENT ID: Randy Hebert GENDER: male DOB: [birth year], MRN: [id number]  Chief Complaint  Patient presents with   Follow-up    Review PET scan.  Cough with yellow sputum x 1 week.   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   On level ground he is not limited by DOE. Can climb 1-2 flights of stairs before needs to stop for DOE.   Using symbicort 2 puffs twice daily, rinsing mouth after use.   Otherwise pertinent review of systems is negative.  No clear cut family history of lung cancer. His aunt needed lung transplant but unsure why  He is disabled, worked in past in Science writer. Still smoking 1 ppd. Off alcohol since January 2023. Off cocaine since 8 ya.   Past Medical History:  Diagnosis Date   ACE inhibitor-aggravated angioedema    Allergy    Angio-edema    Anxiety    Arthritis    Asthma    hip replacement   Back pain    Bradycardia 04/28/2017   Bulging of cervical intervertebral disc    CAD (coronary artery disease)    lateral STEMI 02/06/2015 00% D1 occlusion treated with Promus Premier 2.5 mm x 16 mm DES, 70% ramus stenosis, 40% mid RCA stenosis, 45% distal RCA stenosis, EF 45-50%   CHF (congestive heart failure) (HCC)    COPD (chronic obstructive pulmonary disease) ([place])    Depression    Diabetes mellitus without complication ([place])    Difficult intubation    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   Dry eye    Dyspnea    Early satiety 09/23/2016   GERD (gastroesophageal reflux disease)    HCAP (healthcare-associated pneumonia) 05/15/2017   Headache    Heart murmur    Hip pain    Hyperlipidemia    Hypertension    Lobar pneumonia ([place]) 05/15/2017   Melena 08/04/2018   MI (myocardial  infarction) ([place])    Myocardial infarction (HCC)    Neck pain    Non-ST elevation (NSTEMI) myocardial infarction ([place]) 04/27/2017   NSTEMI (non-ST elevated myocardial infarction) ([place]) 04/26/2017   Otitis media    Pleurisy    Pneumonia due to COVID-19 virus 10/07/2019   Rectal bleeding 11/08/2018   Right shoulder pain 03/20/2020   Sinus pause    9 sec sinus pause on telemetry after started on coreg after MI, avoid AV nodal blocking agent   Sleep apnea    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   Arthritis Mother    Heart disease Brother 49       died in sleep   Early death Brother    Diabetes Maternal Uncle    Alzheimer's disease Maternal Grandmother      Past Surgical History:  Procedure Laterality Date   BIOPSY  10/09/2016   Procedure: BIOPSY;  Surgeon: Daneil Dolin, MD;  [facility];  Service: Endoscopy;;   BIOPSY  11/28/2019   Procedure: BIOPSY;  Surgeon: Daneil Dolin, MD;  [facility];  Service: Endoscopy;;   CARDIAC CATHETERIZATION N/A 02/06/2015   Procedure: Left Heart Cath and Coronary Angiography;  Surgeon: Leonie Man, MD;  Location: [facility];  Service: Cardiovascular;  Laterality: N/A;   CARDIAC CATHETERIZATION N/A 02/06/2015   Procedure: Coronary Stent Intervention;  Surgeon: Leonie Man, MD;  Location: [facility];  Service: Cardiovascular;  Laterality: N/A;   COLONOSCOPY WITH PROPOFOL N/A 10/09/2016   Sigmoid and descending colon diverticulosis, four 4-6 mm polyps in sigmoid, one 4 mm polyp in  descending. Tubular adenomas and hyperplastic. 5 year surveillance.    COLONOSCOPY WITH PROPOFOL N/A 11/24/2016   Sigmoid and descending colon diverticulosis, four 4-6 mm polyps in sigmoid, one 4 mm polyp in descending. Tubular adenomas and hyperplastic. 5 year surveillance.    COLONOSCOPY WITH PROPOFOL N/A 10/18/2021   [place]: nonbleeding internal hemorrhoids, small and large mouth diverticula found in the sigmoid, descending, transverse colon.  A 9 mm sessile polyp was found in the ascending colon that was removed.  4 sessile polyps found in the sigmoid, descending, transverse colon 3 to 5 mm in size, examining otherwise.  Path revealed tubular adenomas.  Repeat due in 5 years for surveillance.   CORONARY ANGIOPLASTY WITH STENT PLACEMENT  01/2015   CORONARY STENT INTERVENTION N/A 04/27/2017   Procedure: CORONARY STENT INTERVENTION;  Surgeon: Nelva Bush, MD;  Location: [facility];  Service: Cardiovascular;  Laterality: N/A;   ELECTROPHYSIOLOGY STUDY N/A 06/29/2017   Procedure: ELECTROPHYSIOLOGY STUDY;  Surgeon: Evans Lance, MD;  Location: [facility];  Service: Cardiovascular;  Laterality: N/A;   ESOPHAGOGASTRODUODENOSCOPY (EGD) WITH PROPOFOL N/A 10/09/2016   Dr. Gala Romney: LA grade a esophagitis.  Barrett's esophagus, biopsy-proven.  Small hiatal hernia.  EGD February 2019   ESOPHAGOGASTRODUODENOSCOPY (EGD) WITH PROPOFOL N/A 11/28/2019    salmon-colored esophageal mucosa (Barrett's) small hiatal hernia, portal hypertensive gastropathy, normal duodenum, 3 year surveillance   ESOPHAGOGASTRODUODENOSCOPY (EGD) WITH PROPOFOL N/A 09/06/2021   three columns of grade 1 varices in distal esophagus, no stigmata of bleeding or red wale signs. Small hiatal hernia. Mild portal gastropathy. Normal duodenum. Repeat upper endoscopy in 1 year for surveillance.   INCISION / DRAINAGE HAND / FINGER     LEFT HEART CATH AND CORONARY ANGIOGRAPHY N/A 04/27/2017   Procedure: LEFT HEART CATH AND  CORONARY ANGIOGRAPHY;  Surgeon: Nelva Bush, MD;  Location: [place] [facility];  Service: Cardiovascular;  Laterality: N/A;   LOOP RECORDER INSERTION  06/29/2017   Procedure: Loop Recorder Insertion;  Surgeon: Evans Lance, MD;  Location: [facility];  Service: Cardiovascular;;   POLYPECTOMY  11/24/2016   Procedure: POLYPECTOMY;  Surgeon: Daneil Dolin, MD;  [facility];  Service: Endoscopy;;  descending and sigmoid   POLYPECTOMY  10/18/2021   Procedure: POLYPECTOMY;  Surgeon: Eloise Harman, DO;  [facility];  Service: Endoscopy;;   TOTAL HIP ARTHROPLASTY Right 07/01/2016   TOTAL HIP ARTHROPLASTY Right 07/01/2016   Procedure: RIGHT TOTAL HIP ARTHROPLASTY ANTERIOR APPROACH;  Surgeon: Mcarthur Rossetti, MD;  Location: [facility];  Service: Orthopedics;  Laterality: Right;    Social History   Socioeconomic History   Marital status: Divorced    Spouse  name: alice   Number of children: 3   Years of education: 4   Highest education level: Not on file  Occupational History   Occupation: SSI  Tobacco Use   Smoking status: Every Day    Packs/day: 1.00    Years: 46.00    Total pack years: 46.00    Types: Cigarettes    Passive exposure: Never   Smokeless tobacco: Former    Types: Chew   Tobacco comments:    Smokes 1 pack a day   Vaping Use   Vaping Use: Former  Substance and Sexual Activity   Alcohol use: Not Currently    Comment: couple beers occ- stopped 09/2021   Drug use: No    Comment: history of drug use- marijuana, cocaine- quit 2014   Sexual activity: Yes    Partners: Female    Birth control/protection: None  Other Topics Concern   Not on file  Social History Narrative   Lives with girlfriend [name]   3 children, but 1 OD  In 2019.   Grandchildren-2      Two cats: may and princess; and a dog-foxy      Enjoy: spending time with pets, TV, yard work       Diet: eats all food groups   Caffeine: half and half coffee, some  tea-half and half decaf   Water: 6-8 cups daily      Wears seat belt   Does not use phone while driving   Smoke detectors at home      Social Determinants of Health   Financial Resource Strain: Low Risk  (10/28/2021)   Overall Financial Resource Strain (CARDIA)    Difficulty of Paying Living Expenses: Not hard at all  Food Insecurity: No Food Insecurity (01/23/2022)   Hunger Vital Sign    Worried About Running Out of Food in the Last Year: Never true    [place] in the Last Year: Never true  Transportation Needs: No Transportation Needs (01/23/2022)   PRAPARE - Hydrologist (Medical): No    Lack of Transportation (Non-Medical): No  Physical Activity: Inactive (10/28/2021)   Exercise Vital Sign    Days of Exercise per Week: 0 days    Minutes of Exercise per Session: 0 min  Stress: No Stress Concern Present (10/28/2021)   [place]    Feeling of Stress : Not at all  Social Connections: Moderately Integrated (10/28/2021)   Social Connection and Isolation Panel [NHANES]    Frequency of Communication with Friends and Family: More than three times a week    Frequency of Social Gatherings with Friends and Family: Twice a week    Attends Religious Services: More than 4 times per year    Active Member of [employer] or Organizations: Yes    Attends Music therapist: More than 4 times per year    Marital Status: Divorced  Intimate Partner Violence: Not At Risk (10/28/2021)   Humiliation, Afraid, Rape, and Kick questionnaire    Fear of Current or Ex-Partner: No    Emotionally Abused: No    Physically Abused: No    Sexually Abused: No     Allergies  Allergen Reactions   Carvedilol Other (See Comments)    Sinus pause on telemetry >3 seconds. Longest one 9 sec. No AV nodal agent   Lisinopril Anaphylaxis, Shortness Of Breath and Swelling    Angioedema, required intubation and  mechanical ventilation   Chantix [Varenicline] Other (See Comments)    Nightmares and unable to sleep well   Amoxicillin Nausea Only    Did it involve swelling of the face/tongue/throat, SOB, or low BP? No Did it involve sudden or severe rash/hives, skin peeling, or any reaction on the inside of your mouth or nose? No Did you need to seek medical attention at a hospital or doctor's office? No When did it last happen? childhood reaction      If all above answers are "NO", may proceed with cephalosporin use.      Outpatient Medications Prior to Visit  Medication Sig Dispense Refill   acetaminophen (TYLENOL) 500 MG tablet Take 2 tablets (1,000 mg total) by mouth every 8 (eight) hours as needed for mild pain or fever.     albuterol (PROVENTIL) (2.5 MG/3ML) 0.083% nebulizer solution Take 3 mLs (2.5 mg total) by nebulization every 6 (six) hours as needed for wheezing or shortness of breath. 150 mL 1   albuterol (VENTOLIN HFA) 108 (90 Base) MCG/ACT inhaler INHALE 2 PUFFS INTO THE LUNGS EVERY 6 HOURS AS NEEDEDFOR SHORTNESS OF BREATH OR WHEEZING. 18 g 1   amLODipine (NORVASC) 10 MG tablet TAKE 1 TABLET BY MOUTH ONCE A DAY. 90 tablet 0   ascorbic acid (VITAMIN C) 500 MG tablet Take 1 tablet (500 mg total) by mouth daily. 30 tablet 1   aspirin 81 MG EC tablet Take 1 tablet (81 mg total) by mouth daily. (Patient taking differently: Take 81 mg by mouth in the morning and at bedtime.) 30 tablet    atorvastatin (LIPITOR) 80 MG tablet Take 1 tablet by mouth once daily 90 tablet 0   azelastine (ASTELIN) 0.1 % nasal spray USE 2 SPRAYS IN EACH NOSTRIL TWICE DAILY. 30 mL 11   Blood Glucose Monitoring Suppl (ACCU-CHEK GUIDE ME) w/Device KIT 1 Piece by Does not apply route as directed. 1 kit 0   budesonide-formoterol (SYMBICORT) 160-4.5 MCG/ACT inhaler Take 2 puffs first thing in am and then another 2 puffs about 12 hours later. 1 each 12   Cholecalciferol (VITAMIN D3) 125 MCG (5000 UT) CAPS Take 1 capsule (5,000  Units total) by mouth daily. 90 capsule 0   citalopram (CELEXA) 20 MG tablet Take 1 tablet (20 mg total) by mouth daily. 90 tablet 3   cycloSPORINE (RESTASIS) 0.05 % ophthalmic emulsion Place 1 drop into both eyes 2 (two) times daily.     Dulaglutide (TRULICITY) 1.5 YC/1.4GY SOPN INJECT 1.5 MG INTO THE SKIN ONCE WEEKLY. 6 mL 0   FARXIGA 10 MG TABS tablet TAKE ONE TABLET BY MOUTH ONCE DAILY. 30 tablet 11   ferrous sulfate 325 (65 FE) MG tablet Take 1 tablet (325 mg total) by mouth daily with breakfast. 90 tablet 1   furosemide (LASIX) 40 MG tablet Take 1 tablet (40 mg total) by mouth daily as needed for fluid. 30 tablet 11   glipiZIDE (GLUCOTROL XL) 5 MG 24 hr tablet TAKE 1 TABLET BY MOUTH DAILY WITH BREAKFAST. 90 tablet 0   glucose blood (ACCU-CHEK GUIDE) test strip Use to test glucose 2 times daily. 100 each 2   lactulose (CHRONULAC) 10 GM/15ML solution Take 15 mLs (10 g total) by mouth daily. 473 mL 6   magnesium gluconate (MAGONATE) 500 MG tablet Take 500 mg by mouth 2 (two) times daily.     metFORMIN (GLUCOPHAGE) 500 MG tablet TAKE 1 TABLET BY MOUTH TWICE DAILY WITH MEALS. 60 tablet 3   mometasone (ELOCON)  0.1 % cream Apply 1 application topically daily as needed (eczema on ears).     olopatadine (PATANOL) 0.1 % ophthalmic solution Place 1 drop into both eyes 2 (two) times daily.     pantoprazole (PROTONIX) 40 MG tablet TAKE (1) TABLET BY MOUTH TWICE DAILY BEFORE A MEAL. 180 tablet 3   No facility-administered medications prior to visit.       Objective:   Physical Exam:  General appearance: 56 y.o., male, NAD, conversant  Eyes: anicteric sclerae; PERRL, tracking appropriately HENT: NCAT; MMM Neck: Trachea midline; no lymphadenopathy, no JVD Lungs: rhonchi bl, no wheeze, with normal respiratory effort CV: RRR, no murmur  Abdomen: Soft, non-tender; non-distended, BS present  Extremities: No peripheral edema, warm Skin: Normal turgor and texture; no rash Psych: Appropriate  affect Neuro: Alert and oriented to person and place, no focal deficit     Vitals:   08/14/22 1414  BP: 130/82  Pulse: 68  Temp: 98 F (36.7 C)  TempSrc: Oral  SpO2: 97%  Weight: 201 lb (91.2 kg)  Height: _0  (1.651 m)   97% on RA BMI Readings from Last 3 Encounters:  08/14/22 33.45 kg/m  07/15/22 33.12 kg/m  07/03/22 32.88 kg/m   Wt Readings from Last 3 Encounters:  08/14/22 201 lb (91.2 kg)  07/15/22 199 lb (90.3 kg)  07/03/22 197 lb 9.6 oz (89.6 kg)     CBC    Component Value Date/Time   WBC 6.9 02/17/2022 0811   WBC 4.8 09/06/2021 0704   RBC 5.81 (H) 02/17/2022 0811   RBC 2.86 (L) 09/06/2021 0704   HGB 16.1 02/17/2022 0811   HCT 48.4 02/17/2022 0811   PLT 197 02/17/2022 0811   MCV 83 02/17/2022 0811   MCH 27.7 02/17/2022 0811   MCH 30.4 09/06/2021 0704   MCHC 33.3 02/17/2022 0811   MCHC 32.1 09/06/2021 0704   RDW 17.1 (H) 02/17/2022 0811   LYMPHSABS 1.8 09/13/2021 0955   MONOABS 0.5 09/05/2021 0415   EOSABS 0.2 09/13/2021 0955   BASOSABS 0.0 09/13/2021 0955      Chest Imaging: 07/18/22 PET/CT reviewed by me with weakly avid RUL 1cm nodule  Pulmonary Functions Testing Results:    Latest Ref Rng & Units 07/13/2015    8:44 AM  PFT Results  FVC-Pre L 2.74   FVC-Predicted Pre % 64   FVC-Post L 2.64   FVC-Predicted Post % 62   Pre FEV1/FVC % % 79   Post FEV1/FCV % % 83   FEV1-Pre L 2.16   FEV1-Predicted Pre % 65   FEV1-Post L 2.20   DLCO uncorrected ml/min/mmHg 15.58   DLCO UNC% % 60   DLVA Predicted % 79   TLC L 4.55   TLC % Predicted % 76   RV % Predicted % 97      Echocardiogram 08/05/22:   1. Left ventricular ejection fraction, by estimation, is 50 to 55%. The  left ventricle has low normal function. The left ventricle has no regional  wall motion abnormalities. Left ventricular diastolic parameters are  indeterminate.   2. Right ventricular systolic function is normal. The right ventricular  size is normal.   3. The mitral  valve is normal in structure. No evidence of mitral valve  regurgitation. No evidence of mitral stenosis.   4. The aortic valve is tricuspid. There is mild calcification of the  aortic valve. There is mild thickening of the aortic valve. Aortic valve  regurgitation is not visualized. No aortic stenosis  is present.   5. The inferior vena cava is normal in size with greater than 50%  respiratory variability, suggesting right atrial pressure of 3 mmHg.      Assessment & Plan:   # RUL weakly avid nodule Highly suspicious for stage I nsclc  # cervical djd with radicular symptoms Upcoming decompression/fixation  # COPD gold group B  # Smoking  Plan: - needs updated PFTs ASAP. Provided he has not suffered significant lung function decline he may still be candidate for resection though he does have serious comorbidities that must be weighed - cirrhosis though appears well compensated, CAD and would need cardiology pre-op eval. - referral placed to [facility] for consideration of combination dye-marking/resection if felt to be surgical candidate - recommended taking care of all of this before proceeding with neck surgery but he's adamant that he wants to address that first and is aware that while this appears to be very early stage, there is some risk of cancer progression - continue symbicort - start chantix, skip evening dose due to history of nightmares, stop smoking ideally a week after starting chantix    Maryjane Hurter, MD [facility] 08/14/2022 2:37 PM

## 2022-08-14 ENCOUNTER — Encounter: Payer: Self-pay | Admitting: Student

## 2022-08-14 ENCOUNTER — Ambulatory Visit (INDEPENDENT_AMBULATORY_CARE_PROVIDER_SITE_OTHER): Payer: Medicare Other | Admitting: Student

## 2022-08-14 VITALS — BP 130/82 | HR 68 | Temp 98.0°F | Ht 65.0 in | Wt 201.0 lb

## 2022-08-14 DIAGNOSIS — J449 Chronic obstructive pulmonary disease, unspecified: Secondary | ICD-10-CM

## 2022-08-14 DIAGNOSIS — R911 Solitary pulmonary nodule: Secondary | ICD-10-CM | POA: Diagnosis not present

## 2022-08-14 DIAGNOSIS — F17200 Nicotine dependence, unspecified, uncomplicated: Secondary | ICD-10-CM

## 2022-08-14 MED ORDER — VARENICLINE TARTRATE 0.5 MG PO TABS: 0.5000 mg | ORAL_TABLET | Freq: Every day | ORAL | 0 refills | Status: DC

## 2022-08-14 MED ORDER — VARENICLINE TARTRATE 1 MG PO TABS: 1.0000 mg | ORAL_TABLET | Freq: Every day | ORAL | 1 refills | Status: DC

## 2022-08-14 NOTE — Patient Instructions (Addendum)
-   chantix 0.5 mg once daily for a few days if tolerated increase to 1 mg daily. Would take in morning to avoid nightmares. If nightmares anyway ok to stop - Try to stop smoking after first week of being on chantix - symbicort 2 puffs twice daily, rinse mouth after use - albuterol as needed - referral to cardiothoracic surgery - make sure that Dr. Redmond Pulling is aware we're working on this pulmonary nodule and concerned it's probably a stage I lung cancer

## 2022-08-20 ENCOUNTER — Ambulatory Visit (HOSPITAL_COMMUNITY)
Admission: RE | Admit: 2022-08-20 | Discharge: 2022-08-20 | Disposition: A | Discharge status: Home / Self Care | Payer: Medicare Other | Source: Ambulatory Visit | Attending: Student | Admitting: Student

## 2022-08-20 DIAGNOSIS — R059 Cough, unspecified: Secondary | ICD-10-CM | POA: Insufficient documentation

## 2022-08-20 DIAGNOSIS — R942 Abnormal results of pulmonary function studies: Secondary | ICD-10-CM | POA: Diagnosis not present

## 2022-08-20 DIAGNOSIS — J449 Chronic obstructive pulmonary disease, unspecified: Secondary | ICD-10-CM | POA: Diagnosis not present

## 2022-08-20 DIAGNOSIS — R0609 Other forms of dyspnea: Secondary | ICD-10-CM | POA: Diagnosis not present

## 2022-08-20 DIAGNOSIS — R911 Solitary pulmonary nodule: Secondary | ICD-10-CM | POA: Insufficient documentation

## 2022-08-20 DIAGNOSIS — R062 Wheezing: Secondary | ICD-10-CM | POA: Insufficient documentation

## 2022-08-20 LAB — PULMONARY FUNCTION TEST
DL/VA % pred: 84 %
DL/VA: 3.71 ml/min/mmHg/L
DLCO unc % pred: 64 %
DLCO unc: 15.45 ml/min/mmHg
FEF 25-75 Post: 1.71 L/sec
FEF 25-75 Pre: 1.87 L/sec
FEF2575-%Change-Post: -8 %
FEF2575-%Pred-Post: 64 %
FEF2575-%Pred-Pre: 69 %
FEV1-%Change-Post: 0 %
FEV1-%Pred-Post: 70 %
FEV1-%Pred-Pre: 70 %
FEV1-Post: 2.18 L
FEV1-Pre: 2.19 L
FEV1FVC-%Change-Post: 7 %
FEV1FVC-%Pred-Pre: 98 %
FEV6-%Change-Post: -7 %
FEV6-%Pred-Post: 68 %
FEV6-%Pred-Pre: 74 %
FEV6-Post: 2.65 L
FEV6-Pre: 2.86 L
FEV6FVC-%Change-Post: 0 %
FEV6FVC-%Pred-Post: 104 %
FEV6FVC-%Pred-Pre: 104 %
FVC-%Change-Post: -7 %
FVC-%Pred-Post: 66 %
FVC-%Pred-Pre: 71 %
FVC-Post: 2.7 L
FVC-Pre: 2.92 L
Post FEV1/FVC ratio: 81 %
Post FEV6/FVC ratio: 100 %
Pre FEV1/FVC ratio: 75 %
Pre FEV6/FVC Ratio: 99 %
RV % pred: 185 %
RV: 3.53 L
TLC % pred: 105 %
TLC: 6.33 L

## 2022-08-20 MED ORDER — ALBUTEROL SULFATE (2.5 MG/3ML) 0.083% IN NEBU
2.5000 mg | INHALATION_SOLUTION | Freq: Once | RESPIRATORY_TRACT | Status: AC
  Administered 2022-08-20: 2.5 mg via RESPIRATORY_TRACT

## 2022-08-26 ENCOUNTER — Other Ambulatory Visit: Payer: Self-pay | Admitting: Gastroenterology

## 2022-08-26 DIAGNOSIS — K7030 Alcoholic cirrhosis of liver without ascites: Secondary | ICD-10-CM

## 2022-08-28 DIAGNOSIS — I2510 Atherosclerotic heart disease of native coronary artery without angina pectoris: Secondary | ICD-10-CM | POA: Diagnosis not present

## 2022-08-28 DIAGNOSIS — I10 Essential (primary) hypertension: Secondary | ICD-10-CM | POA: Diagnosis not present

## 2022-08-28 DIAGNOSIS — E785 Hyperlipidemia, unspecified: Secondary | ICD-10-CM | POA: Diagnosis not present

## 2022-08-28 DIAGNOSIS — Z7984 Long term (current) use of oral hypoglycemic drugs: Secondary | ICD-10-CM | POA: Diagnosis not present

## 2022-08-28 DIAGNOSIS — M4802 Spinal stenosis, cervical region: Secondary | ICD-10-CM | POA: Diagnosis not present

## 2022-08-28 DIAGNOSIS — Z01818 Encounter for other preprocedural examination: Secondary | ICD-10-CM | POA: Diagnosis not present

## 2022-08-28 DIAGNOSIS — Z7982 Long term (current) use of aspirin: Secondary | ICD-10-CM | POA: Diagnosis not present

## 2022-08-28 DIAGNOSIS — E782 Mixed hyperlipidemia: Secondary | ICD-10-CM | POA: Diagnosis not present

## 2022-08-28 DIAGNOSIS — I5030 Unspecified diastolic (congestive) heart failure: Secondary | ICD-10-CM | POA: Diagnosis not present

## 2022-08-28 DIAGNOSIS — E1159 Type 2 diabetes mellitus with other circulatory complications: Secondary | ICD-10-CM | POA: Diagnosis not present

## 2022-08-28 DIAGNOSIS — Z01812 Encounter for preprocedural laboratory examination: Secondary | ICD-10-CM | POA: Diagnosis not present

## 2022-08-28 DIAGNOSIS — K7460 Unspecified cirrhosis of liver: Secondary | ICD-10-CM | POA: Diagnosis not present

## 2022-08-28 DIAGNOSIS — I5042 Chronic combined systolic (congestive) and diastolic (congestive) heart failure: Secondary | ICD-10-CM | POA: Diagnosis not present

## 2022-08-28 DIAGNOSIS — Z7951 Long term (current) use of inhaled steroids: Secondary | ICD-10-CM | POA: Diagnosis not present

## 2022-08-28 DIAGNOSIS — I110 Hypertensive heart disease with heart failure: Secondary | ICD-10-CM | POA: Diagnosis not present

## 2022-08-28 DIAGNOSIS — Z79899 Other long term (current) drug therapy: Secondary | ICD-10-CM | POA: Diagnosis not present

## 2022-08-28 DIAGNOSIS — K219 Gastro-esophageal reflux disease without esophagitis: Secondary | ICD-10-CM | POA: Diagnosis not present

## 2022-09-03 ENCOUNTER — Other Ambulatory Visit: Payer: Self-pay | Admitting: Family Medicine

## 2022-09-03 DIAGNOSIS — G4733 Obstructive sleep apnea (adult) (pediatric): Secondary | ICD-10-CM | POA: Diagnosis not present

## 2022-09-10 ENCOUNTER — Encounter: Payer: Self-pay | Admitting: Thoracic Surgery (Cardiothoracic Vascular Surgery)

## 2022-09-10 ENCOUNTER — Other Ambulatory Visit: Payer: Self-pay

## 2022-09-10 ENCOUNTER — Institutional Professional Consult (permissible substitution) (INDEPENDENT_AMBULATORY_CARE_PROVIDER_SITE_OTHER): Payer: Medicare Other | Admitting: Thoracic Surgery (Cardiothoracic Vascular Surgery)

## 2022-09-10 VITALS — BP 140/71 | HR 76 | Resp 20 | Ht 65.0 in | Wt 200.0 lb

## 2022-09-10 DIAGNOSIS — K922 Gastrointestinal hemorrhage, unspecified: Secondary | ICD-10-CM

## 2022-09-10 DIAGNOSIS — R911 Solitary pulmonary nodule: Secondary | ICD-10-CM | POA: Diagnosis not present

## 2022-09-10 DIAGNOSIS — K219 Gastro-esophageal reflux disease without esophagitis: Secondary | ICD-10-CM

## 2022-09-10 DIAGNOSIS — K7030 Alcoholic cirrhosis of liver without ascites: Secondary | ICD-10-CM

## 2022-09-10 NOTE — Progress Notes (Signed)
PCP is Coral Spikes, DO Referring Provider is Maryjane Hurter, MD  Chief Complaint  Patient presents with   Lung Lesion    Surgical consult, PET Scan 07/07/22/ Chest CT 06/25/22/ PFT's 08/20/22    HPI: Randy Hebert sent for consultation regarding a right upper lobe lung nodule.  Randy Hebert is a 57 year old gentleman with numerous medical problems including tobacco abuse, COPD, CAD, MI, stents, congestive heart failure, cervical disc disease with left arm weakness, anxiety, depression, type 2 diabetes, cirrhosis, sleep apnea, cocaine and ethanol abuse.  He had a low-dose CT for lung cancer screening which showed a new 12 mm nodule in the right apex.  Subsequent PET/CT showed the nodule was mildly hypermetabolic with an SUV of 7.09.  There is no evidence of regional or distant metastatic disease.  He smoked about a pack a day for 30 years.  He says he currently is down to 1/2 pack/day.  He was being evaluated for cervical spine surgery in the [facility] system and was told he would have to quit smoking for 6 weeks prior to surgery.  However he was unable to do so and that surgery was canceled.  He has severe left arm pain and muscle loss and weakness in the left hand.  Has cirrhosis.  Says he still has a drink occasionally.  History of heavy cocaine use, quit 8 years ago.  He is disabled.  Says that he can walk up a flight of stairs without any difficulty but gets short of breath with going up 2 or more.  Was having some intermittent chest pressure consistent with angina when he saw Dr. [name] back in November 2023.  He denies any episodes since then.  No change in appetite or weight loss.  Zubrod Score: At the time of surgery this patient's most appropriate activity status/level should be described as: []     0    Normal activity, no symptoms []     1    Restricted in physical strenuous activity but ambulatory, able to do out light work [x]     2    Ambulatory and capable of self care, unable to do  work activities, up and about >50 % of waking hours                              []     3    Only limited self care, in bed greater than 50% of waking hours []     4    Completely disabled, no self care, confined to bed or chair []     5    Moribund  Past Medical History:  Diagnosis Date   ACE inhibitor-aggravated angioedema    Allergy    Angio-edema    Anxiety    Arthritis    Asthma    hip replacement   Back pain    Bradycardia 04/28/2017   Bulging of cervical intervertebral disc    CAD (coronary artery disease)    lateral STEMI 02/06/2015 00% D1 occlusion treated with Promus Premier 2.5 mm x 16 mm DES, 70% ramus stenosis, 40% mid RCA stenosis, 45% distal RCA stenosis, EF 45-50%   CHF (congestive heart failure) (HCC)    COPD (chronic obstructive pulmonary disease) ([place])    Depression    Diabetes mellitus without complication ([place])    Difficult intubation    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   Dry eye    Dyspnea  Early satiety 09/23/2016   GERD (gastroesophageal reflux disease)    HCAP (healthcare-associated pneumonia) 05/15/2017   Headache    Heart murmur    Hip pain    Hyperlipidemia    Hypertension    Lobar pneumonia ([place]) 05/15/2017   Melena 08/04/2018   MI (myocardial infarction) [facility])    Myocardial infarction (HCC)    Neck pain    Non-ST elevation (NSTEMI) myocardial infarction ([place]) 04/27/2017   NSTEMI (non-ST elevated myocardial infarction) ([place]) 04/26/2017   Otitis media    Pleurisy    Pneumonia due to COVID-19 virus 10/07/2019   Rectal bleeding 11/08/2018   Right shoulder pain 03/20/2020   Sinus pause    9 sec sinus pause on telemetry after started on coreg after MI, avoid AV nodal blocking agent   Sleep apnea    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   STEMI (ST elevation myocardial infarction) ([place]) 05/31/2015   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   Syncope 65/46/5035   Syncope and collapse 05/15/2017   Transaminitis 08/04/2018    Unilateral primary osteoarthritis, right hip 07/01/2016    Past Surgical History:  Procedure Laterality Date   BIOPSY  10/09/2016   Procedure: BIOPSY;  Surgeon: Daneil Dolin, MD;  [facility];  Service: Endoscopy;;   BIOPSY  11/28/2019   Procedure: BIOPSY;  Surgeon: Daneil Dolin, MD;  [facility];  Service: Endoscopy;;   CARDIAC CATHETERIZATION N/A 02/06/2015   Procedure: Left Heart Cath and Coronary Angiography;  Surgeon: Leonie Man, MD;  Location: [place] [facility];  Service: Cardiovascular;  Laterality: N/A;   CARDIAC CATHETERIZATION N/A 02/06/2015   Procedure: Coronary Stent Intervention;  Surgeon: Leonie Man, MD;  Location: [place] [facility];  Service: Cardiovascular;  Laterality: N/A;   COLONOSCOPY WITH PROPOFOL N/A 10/09/2016   Sigmoid and descending colon diverticulosis, four 4-6 mm polyps in sigmoid, one 4 mm polyp in descending. Tubular adenomas and hyperplastic. 5 year surveillance.    COLONOSCOPY WITH PROPOFOL N/A 11/24/2016   Sigmoid and descending colon diverticulosis, four 4-6 mm polyps in sigmoid, one 4 mm polyp in descending. Tubular adenomas and hyperplastic. 5 year surveillance.    COLONOSCOPY WITH PROPOFOL N/A 10/18/2021   [place]: nonbleeding internal hemorrhoids, small and large mouth diverticula found in the sigmoid, descending, transverse colon.  A 9 mm sessile polyp was found in the ascending colon that was removed.  4 sessile polyps found in the sigmoid, descending, transverse colon 3 to 5 mm in size, examining otherwise.  Path revealed tubular adenomas.  Repeat due in 5 years for surveillance.   CORONARY ANGIOPLASTY WITH STENT PLACEMENT  01/2015   CORONARY STENT INTERVENTION N/A 04/27/2017   Procedure: CORONARY STENT INTERVENTION;  Surgeon: Nelva Bush, MD;  Location: [place] [facility];  Service: Cardiovascular;  Laterality: N/A;   ELECTROPHYSIOLOGY STUDY N/A 06/29/2017   Procedure: ELECTROPHYSIOLOGY STUDY;  Surgeon:  Evans Lance, MD;  Location: [place] [facility];  Service: Cardiovascular;  Laterality: N/A;   ESOPHAGOGASTRODUODENOSCOPY (EGD) WITH PROPOFOL N/A 10/09/2016   Dr. Gala Romney: LA grade a esophagitis.  Barrett's esophagus, biopsy-proven.  Small hiatal hernia.  EGD February 2019   ESOPHAGOGASTRODUODENOSCOPY (EGD) WITH PROPOFOL N/A 11/28/2019    salmon-colored esophageal mucosa (Barrett's) small hiatal hernia, portal hypertensive gastropathy, normal duodenum, 3 year surveillance   ESOPHAGOGASTRODUODENOSCOPY (EGD) WITH PROPOFOL N/A 09/06/2021   three columns of grade 1 varices in distal esophagus, no stigmata of bleeding or red wale  signs. Small hiatal hernia. Mild portal gastropathy. Normal duodenum. Repeat upper endoscopy in 1 year for surveillance.   INCISION / DRAINAGE HAND / FINGER     LEFT HEART CATH AND CORONARY ANGIOGRAPHY N/A 04/27/2017   Procedure: LEFT HEART CATH AND CORONARY ANGIOGRAPHY;  Surgeon: Nelva Bush, MD;  Location: [place] [facility];  Service: Cardiovascular;  Laterality: N/A;   LOOP RECORDER INSERTION  06/29/2017   Procedure: Loop Recorder Insertion;  Surgeon: Evans Lance, MD;  Location: [place] [facility];  Service: Cardiovascular;;   POLYPECTOMY  11/24/2016   Procedure: POLYPECTOMY;  Surgeon: Daneil Dolin, MD;  [facility];  Service: Endoscopy;;  descending and sigmoid   POLYPECTOMY  10/18/2021   Procedure: POLYPECTOMY;  Surgeon: Eloise Harman, DO;  [facility];  Service: Endoscopy;;   TOTAL HIP ARTHROPLASTY Right 07/01/2016   TOTAL HIP ARTHROPLASTY Right 07/01/2016   Procedure: RIGHT TOTAL HIP ARTHROPLASTY ANTERIOR APPROACH;  Surgeon: Mcarthur Rossetti, MD;  Location: [place];  Service: Orthopedics;  Laterality: Right;    Family History  Problem Relation Age of Onset   Heart attack Father    Stroke Father    Arthritis Father    Heart disease Father    Cancer Mother        ???   Arthritis Mother    Heart disease Brother 6        died in sleep   Early death Brother    Diabetes Maternal Uncle    Alzheimer's disease Maternal Grandmother     Social History Social History   Tobacco Use   Smoking status: Every Day    Packs/day: 1.00    Years: 46.00    Total pack years: 46.00    Types: Cigarettes    Passive exposure: Never   Smokeless tobacco: Former    Types: Chew   Tobacco comments:    Smokes 1 pack a day   Vaping Use   Vaping Use: Former  Substance Use Topics   Alcohol use: Not Currently    Comment: couple beers occ- stopped 09/2021   Drug use: No    Comment: history of drug use- marijuana, cocaine- quit 2014    Current Outpatient Medications  Medication Sig Dispense Refill   acetaminophen (TYLENOL) 500 MG tablet Take 2 tablets (1,000 mg total) by mouth every 8 (eight) hours as needed for mild pain or fever.     albuterol (PROVENTIL) (2.5 MG/3ML) 0.083% nebulizer solution Take 3 mLs (2.5 mg total) by nebulization every 6 (six) hours as needed for wheezing or shortness of breath. 150 mL 1   albuterol (VENTOLIN HFA) 108 (90 Base) MCG/ACT inhaler INHALE 2 PUFFS INTO THE LUNGS EVERY 6 HOURS AS NEEDEDFOR SHORTNESS OF BREATH OR WHEEZING. 18 g 1   amLODipine (NORVASC) 10 MG tablet TAKE 1 TABLET BY MOUTH ONCE A DAY. 90 tablet 1   ascorbic acid (VITAMIN C) 500 MG tablet Take 1 tablet (500 mg total) by mouth daily. 30 tablet 1   aspirin 81 MG EC tablet Take 1 tablet (81 mg total) by mouth daily. (Patient taking differently: Take 81 mg by mouth in the morning and at bedtime.) 30 tablet    atorvastatin (LIPITOR) 80 MG tablet Take 1 tablet by mouth once daily 90 tablet 0   azelastine (ASTELIN) 0.1 % nasal spray USE 2 SPRAYS IN EACH NOSTRIL TWICE DAILY. 30 mL 11   Blood Glucose Monitoring Suppl (ACCU-CHEK GUIDE ME) w/Device KIT 1 Piece by Does  not apply route as directed. 1 kit 0   budesonide-formoterol (SYMBICORT) 160-4.5 MCG/ACT inhaler Take 2 puffs first thing in am and then another 2 puffs about 12 hours later. 1  each 12   Cholecalciferol (VITAMIN D3) 125 MCG (5000 UT) CAPS Take 1 capsule (5,000 Units total) by mouth daily. 90 capsule 0   citalopram (CELEXA) 20 MG tablet Take 1 tablet (20 mg total) by mouth daily. 90 tablet 3   cycloSPORINE (RESTASIS) 0.05 % ophthalmic emulsion Place 1 drop into both eyes 2 (two) times daily.     Dulaglutide (TRULICITY) 1.5 WU/9.8JX SOPN INJECT 1.5 MG INTO THE SKIN ONCE WEEKLY. 6 mL 0   FARXIGA 10 MG TABS tablet TAKE ONE TABLET BY MOUTH ONCE DAILY. 30 tablet 11   ferrous sulfate 325 (65 FE) MG tablet Take 1 tablet (325 mg total) by mouth daily with breakfast. 90 tablet 1   furosemide (LASIX) 40 MG tablet Take 1 tablet (40 mg total) by mouth daily as needed for fluid. 30 tablet 11   glipiZIDE (GLUCOTROL XL) 5 MG 24 hr tablet TAKE 1 TABLET BY MOUTH DAILY WITH BREAKFAST. 90 tablet 0   glucose blood (ACCU-CHEK GUIDE) test strip Use to test glucose 2 times daily. 100 each 2   lactulose (CHRONULAC) 10 GM/15ML solution TAKE 15 MLS BY MOUTH ONCE DAILY. 473 mL 0   magnesium gluconate (MAGONATE) 500 MG tablet Take 500 mg by mouth 2 (two) times daily.     metFORMIN (GLUCOPHAGE) 500 MG tablet TAKE 1 TABLET BY MOUTH TWICE DAILY WITH MEALS. 60 tablet 3   mometasone (ELOCON) 0.1 % cream Apply 1 application topically daily as needed (eczema on ears).     olopatadine (PATANOL) 0.1 % ophthalmic solution Place 1 drop into both eyes 2 (two) times daily.     pantoprazole (PROTONIX) 40 MG tablet TAKE (1) TABLET BY MOUTH TWICE DAILY BEFORE A MEAL. 180 tablet 3   varenicline (CHANTIX CONTINUING MONTH PAK) 1 MG tablet Take 1 tablet (1 mg total) by mouth daily. 30 tablet 1   varenicline (CHANTIX) 0.5 MG tablet Take 1 tablet (0.5 mg total) by mouth daily. 60 tablet 0   No current facility-administered medications for this visit.    Allergies  Allergen Reactions   Carvedilol Other (See Comments)    Sinus pause on telemetry >3 seconds. Longest one 9 sec. No AV nodal agent   Lisinopril  Anaphylaxis, Shortness Of Breath and Swelling    Angioedema, required intubation and mechanical ventilation   Chantix [Varenicline] Other (See Comments)    Nightmares and unable to sleep well   Amoxicillin Nausea Only    Did it involve swelling of the face/tongue/throat, SOB, or low BP? No Did it involve sudden or severe rash/hives, skin peeling, or any reaction on the inside of your mouth or nose? No Did you need to seek medical attention at a hospital or doctor's office? No When did it last happen? childhood reaction      If all above answers are "NO", may proceed with cephalosporin use.     Review of Systems  Constitutional:  Positive for activity change. Negative for unexpected weight change.  HENT:  Positive for voice change. Negative for trouble swallowing.        History of vocal cord dysfunction post prolonged intubation.  Seen by [facility]NT with [facility], no vocal cord paralysis noted.  Eyes:  Negative for visual disturbance.  Respiratory:  Positive for apnea (CPAP at night), shortness of breath and wheezing.  Cardiovascular:  Positive for chest pain.  Gastrointestinal:  Positive for constipation and diarrhea.  Genitourinary:  Positive for frequency. Negative for dysuria.  Musculoskeletal:  Positive for arthralgias, joint swelling, myalgias, neck pain and neck stiffness.  Neurological:  Positive for dizziness and weakness (Left arm and hand).  Hematological:  Bruises/bleeds easily.  Psychiatric/Behavioral:  The patient is nervous/anxious.        Chronic pain  All other systems reviewed and are negative.   BP (!) 140/71   Pulse 76   Resp 20   Ht 5\' 5"  (1.651 m)   Wt 200 lb (90.7 kg)   SpO2 95% Comment: RA  BMI 33.28 kg/m  Physical Exam Vitals reviewed.  Constitutional:      General: He is not in acute distress.    Appearance: He is obese.  HENT:     Head: Normocephalic and atraumatic.  Eyes:     General: No scleral icterus.    Extraocular Movements: Extraocular  movements intact.  Cardiovascular:     Rate and Rhythm: Normal rate and regular rhythm.     Heart sounds: Normal heart sounds. No murmur heard.    No friction rub. No gallop.  Pulmonary:     Effort: Pulmonary effort is normal. No respiratory distress.     Breath sounds: No wheezing.     Comments: Diminished breath sounds bilaterally Abdominal:     Comments: Obese  Musculoskeletal:     Cervical back: Neck supple.     Comments: Thenar wasting left hand  Lymphadenopathy:     Cervical: No cervical adenopathy.  Skin:    General: Skin is warm and dry.     Coloration: Skin is not jaundiced.  Neurological:     General: No focal deficit present.     Mental Status: He is alert and oriented to person, place, and time.     Motor: Weakness (Left arm and hand) present.     Diagnostic Tests: CT CHEST WITHOUT CONTRAST LOW-DOSE FOR LUNG CANCER SCREENING   TECHNIQUE: Multidetector CT imaging of the chest was performed following the standard protocol without IV contrast.   RADIATION DOSE REDUCTION: This exam was performed according to the departmental dose-optimization program which includes automated exposure control, adjustment of the mA and/or kV according to patient size and/or use of iterative reconstruction technique.   COMPARISON:  Lung cancer screening CT dated Jan 25, 2021   FINDINGS: Cardiovascular: Normal heart size. No pericardial effusion. Incidental note is made of right-sided aortic arch. Normal caliber thoracic aorta with calcified plaque. Severe left main and three-vessel coronary artery calcifications.   Mediastinum/Nodes: Esophagus and thyroid are unremarkable. No pathologically enlarged lymph nodes seen in the chest.   Lungs/Pleura: Central airways are patent. No consolidation, pleural effusion or pneumothorax. New solid pulmonary nodule of the right upper lobe measuring 10.3 mm in mean diameter on image 58. Additional small solid pulmonary nodule of the right  upper lobe measuring 3.5 mm on image 140. Previously described ground-glass nodule is no longer present.   Upper Abdomen: Nodular liver contour.  No acute abnormality.   Musculoskeletal: No chest wall mass or suspicious bone lesions identified.   IMPRESSION: 1. New solid pulmonary nodule of the right upper lobe measuring 10.3 mm.Lung-RADS 4B, suspicious. Additional imaging evaluation or consultation with Pulmonology or Thoracic Surgery recommended. 2. Severe left main and three-vessel coronary artery calcifications. 3. Aortic Atherosclerosis (ICD10-I70.0) and Emphysema (ICD10-J43.9).   These results will be called to the ordering clinician or representative by the Radiologist  Environmental consultant, and communication documented in the PACS or [employer].     Electronically Signed   By: Yetta Glassman M.D.   On: 06/27/2022 12:57 NUCLEAR MEDICINE PET SKULL BASE TO THIGH   TECHNIQUE: 10.65 mCi F-18 FDG was injected intravenously. Full-ring PET imaging was performed from the skull base to thigh after the radiotracer. CT data was obtained and used for attenuation correction and anatomic localization.   Fasting blood glucose: 124 mg/dl   COMPARISON:  CT 06/25/2022   FINDINGS: Mediastinal blood pool activity: SUV max 2.39   Liver activity: SUV max NA   NECK: No hypermetabolic lymph nodes in the neck.   Incidental CT findings: None.   CHEST: No tracer avid axillary, supraclavicular, mediastinal, or hilar lymph nodes. Nodule within the apical segment of the right upper lobe is again noted measuring 1 cm with SUV max of 2.75. No additional tracer avid lung nodules identified.   Incidental CT findings: Right-sided aortic arch. Aortic and coronary artery atherosclerotic calcifications.   ABDOMEN/PELVIS: No abnormal hypermetabolic activity within the liver, pancreas, adrenal glands, or spleen. No hypermetabolic lymph nodes in the abdomen or pelvis.   Incidental CT findings:  Morphologic features of the liver compatible with cirrhosis. The spleen measures 14 cm in cranial caudal dimension. Sigmoid diverticulosis without signs of acute diverticulitis. Aortic atherosclerotic calcifications.   SKELETON: No focal hypermetabolic activity to suggest skeletal metastasis.   Incidental CT findings: None.   IMPRESSION: 1. There is mild increased FDG uptake associated with the apical segment of right upper lobe pulmonary nodule. Suspicious for small primary bronchogenic carcinoma. 2. No signs of tracer avid nodal metastasis or distant metastatic disease. 3. Morphologic features of the liver compatible with cirrhosis. 4. Splenomegaly. 5. Right-sided aortic arch. 6. Coronary artery calcifications. 7.  Aortic Atherosclerosis (ICD10-I70.0).     Electronically Signed   By: Kerby Moors M.D.   On: 07/18/2022 10:58 I personally reviewed the CT and PET/CT images.  There is a new 12 mm right upper lobe nodule with mild uptake in the right apex.  No regional or distant metastatic disease.  Nodular liver.  Splenomegaly.  Coronary and aortic atherosclerosis.  Right-sided aortic arch.  Pulmonary function testing FVC 2.74 (64%) FEV1 2.16 (65%) No significant change with bronchodilator DLCO 15.58 (60%)  Impression: Randy Hebert is a 57 year old gentleman with numerous medical problems including tobacco abuse, COPD, CAD, MI, stents, congestive heart failure, cervical disc disease with left arm weakness, anxiety, depression, type 2 diabetes, cirrhosis, sleep apnea, cocaine and ethanol abuse.  He was recently found to have a right upper lobe lung nodule on a low-dose screening CT.  On PET/CT there is mild hypermetabolic activity.  Findings are most consistent with a new primary bronchogenic carcinoma.  Infectious and inflammatory nodules are also in the differential diagnosis, but this almost certainly is a new lung cancer.  Clinical stage would be T1, N0, 1A.  I discussed  potential diagnostic and treatment options with Mr. Rafter.  Primarily comes down to a decision between surgical resection and stereotactic radiation.  He understands that surgical resection is the gold standard.  The plan would be to do a wedge resection followed by a lobectomy if the intraoperative frozen section confirmed cancer.  However surgery does have significant risk particularly in his case with his comorbidities.  Stereotactic radiation is a reasonable alternative and might be a better option for him.  I did discuss with him the surgical procedure which would be a robotic assisted right upper  lobe wedge resection and possible right upper lobectomy.  I informed him of the need for general anesthesia, the incisions to be used, the use of the surgical robot, the use of drains to postoperatively, the expected hospital stay, and the overall recovery.  I informed him of the indications, risks, benefits, and alternatives.  He understands the risks include, but not limited to death, MI, DVT, PE, bleeding, possible need for transfusion, infection, prolonged air leaks, cardiac arrhythmias, respiratory or renal failure, as well as possibility of other unforeseeable complications.  He has several issues which make him a very high risk surgical candidate.   Extensive history of coronary disease.  He would definitely need cardiology clearance.   Cirrhosis.-Seems well compensated at present but still using ethanol. Ongoing tobacco abuse-would need to quit smoking prior to surgery.  Needs to quit smoking prior to neck surgery as well. Difficult airway-given his neck issues and history of difficult airway may be difficult or impossible to get a double-lumen endotracheal tube.  Fortunately ENT did not see any evidence of a paralyzed vocal cord.  Given all these issues navigational bronchoscopy for biopsy and stereotactic radiation for treatment might be a better option for him.  He wishes to think about his  options and will discuss further when he sees Dr. Verlee Monte in a couple of weeks.  He would not need marking of the nodule if we were to proceed with resection.  Plan: 1. He will think over his options and follow-up with Dr. Verlee Monte on 09/25/2022. 2. If he does want to pursue surgery, he would need formal cardiology clearance from Dr. [name].  He has known coronary disease and has had some angina recently. 3.  Quit smoking 4.  If he does decide to pursue surgery after seeing Dr. Verlee Monte, I will be happy to see him back in the office for further discussion. 5.  He would need a preoperative anesthesiologist consult due to his history of a difficult airway.  Melrose Nakayama, MD [facility] [phone]

## 2022-09-11 ENCOUNTER — Encounter: Payer: Self-pay | Admitting: Internal Medicine

## 2022-09-25 ENCOUNTER — Encounter: Payer: Self-pay | Admitting: Student

## 2022-09-25 ENCOUNTER — Ambulatory Visit (INDEPENDENT_AMBULATORY_CARE_PROVIDER_SITE_OTHER): Payer: 59 | Admitting: Pulmonary Disease

## 2022-09-25 ENCOUNTER — Ambulatory Visit: Payer: 59 | Admitting: Student

## 2022-09-25 ENCOUNTER — Encounter: Payer: Self-pay | Admitting: Pulmonary Disease

## 2022-09-25 VITALS — BP 140/80 | HR 80 | Ht 65.0 in | Wt 203.2 lb

## 2022-09-25 DIAGNOSIS — C3411 Malignant neoplasm of upper lobe, right bronchus or lung: Secondary | ICD-10-CM | POA: Insufficient documentation

## 2022-09-25 DIAGNOSIS — R911 Solitary pulmonary nodule: Secondary | ICD-10-CM | POA: Diagnosis not present

## 2022-09-25 NOTE — H&P (View-Only) (Signed)
Synopsis: Referred for pulmonary nodule by Coral Spikes, DO  Subjective:   PATIENT ID: Randy Hebert GENDER: male DOB: Aug 16, 1966, MRN: [id number]  Chief Complaint  Patient presents with   Follow-up    F/up   57yM with history of COPD, CAD, OSA, alcoholic/nonalcoholic cirrhosis, ?difficult intubation due to vocal cord injury referred for pet avid pulmonary nodule RUL  No recent courses of prednisone for AECOPD.   On level ground he is not limited by DOE. Can climb 1-2 flights of stairs before needs to stop for DOE.   Using symbicort 2 puffs twice daily, rinsing mouth after use.   Otherwise pertinent review of systems is negative.  No clear cut family history of lung cancer. His aunt needed lung transplant but unsure why  He is disabled, worked in past in Science writer. Still smoking 1 ppd. Off alcohol since January 2023. Off cocaine since 8 ya.   OV 09/25/2056: Here today for follow-up after recent consultation with Dr. Roxan Hockey from cardiothoracic surgery.  Due to his medical comorbidities they have opted for decision to pursue navigational bronchoscopy and tissue sampling then referral for SBRT.  Felt that the risk for surgery was too high the patient has decided to move forward with bronchoscopy and radiation referral.  Pending upon the pathology results.  We will get him set up to see Dr. Bjorn Loser for the procedure within the next couple of weeks.  Past Medical History:  Diagnosis Date   ACE inhibitor-aggravated angioedema    Allergy    Angio-edema    Anxiety    Arthritis    Asthma    hip replacement   Back pain    Bradycardia 04/28/2017   Bulging of cervical intervertebral disc    CAD (coronary artery disease)    lateral STEMI 02/06/2015 00% D1 occlusion treated with Promus Premier 2.5 mm x 16 mm DES, 70% ramus stenosis, 40% mid RCA stenosis, 45% distal RCA stenosis, EF 45-50%   CHF (congestive heart failure) (HCC)    COPD (chronic obstructive  pulmonary disease) ([place])    Depression    Diabetes mellitus without complication ([place])    Difficult intubation    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   Dry eye    Dyspnea    Early satiety 09/23/2016   GERD (gastroesophageal reflux disease)    HCAP (healthcare-associated pneumonia) 05/15/2017   Headache    Heart murmur    Hip pain    Hyperlipidemia    Hypertension    Lobar pneumonia ([place]) 05/15/2017   Melena 08/04/2018   MI (myocardial infarction) [facility])    Myocardial infarction ([place])    Neck pain    Non-ST elevation (NSTEMI) myocardial infarction ([place]) 04/27/2017   NSTEMI (non-ST elevated myocardial infarction) ([place]) 04/26/2017   Otitis media    Pleurisy    Pneumonia due to COVID-19 virus 10/07/2019   Rectal bleeding 11/08/2018   Right shoulder pain 03/20/2020   Sinus pause    9 sec sinus pause on telemetry after started on coreg after MI, avoid AV nodal blocking agent   Sleep apnea    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   Arthritis Mother    Heart disease Brother 25       died in sleep   Early death Brother    Diabetes Maternal Uncle    Alzheimer's disease Maternal Grandmother      Past Surgical History:  Procedure Laterality Date   BIOPSY  10/09/2016   Procedure: BIOPSY;  Surgeon: Daneil Dolin, MD;  [facility];  Service: Endoscopy;;   BIOPSY  11/28/2019   Procedure: BIOPSY;  Surgeon: Daneil Dolin, MD;  [facility];  Service: Endoscopy;;   CARDIAC CATHETERIZATION N/A 02/06/2015   Procedure: Left Heart Cath and  Coronary Angiography;  Surgeon: Leonie Man, MD;  Location: [facility];  Service: Cardiovascular;  Laterality: N/A;   CARDIAC CATHETERIZATION N/A 02/06/2015   Procedure: Coronary Stent Intervention;  Surgeon: Leonie Man, MD;  Location: [facility];  Service: Cardiovascular;  Laterality: N/A;   COLONOSCOPY WITH PROPOFOL N/A 10/09/2016   Sigmoid and descending colon diverticulosis, four 4-6 mm polyps in sigmoid, one 4 mm polyp in descending. Tubular adenomas and hyperplastic. 5 year surveillance.    COLONOSCOPY WITH PROPOFOL N/A 11/24/2016   Sigmoid and descending colon diverticulosis, four 4-6 mm polyps in sigmoid, one 4 mm polyp in descending. Tubular adenomas and hyperplastic. 5 year surveillance.    COLONOSCOPY WITH PROPOFOL N/A 10/18/2021   [place]: nonbleeding internal hemorrhoids, small and large mouth diverticula found in the sigmoid, descending, transverse colon.  A 9 mm sessile polyp was found in the ascending colon that was removed.  4 sessile polyps found in the sigmoid, descending, transverse colon 3 to 5 mm in size, examining otherwise.  Path revealed tubular adenomas.  Repeat due in 5 years for surveillance.   CORONARY ANGIOPLASTY WITH STENT PLACEMENT  01/2015   CORONARY STENT INTERVENTION N/A 04/27/2017   Procedure: CORONARY STENT INTERVENTION;  Surgeon: Nelva Bush, MD;  Location: [facility];  Service: Cardiovascular;  Laterality: N/A;   ELECTROPHYSIOLOGY STUDY N/A 06/29/2017   Procedure: ELECTROPHYSIOLOGY STUDY;  Surgeon: Evans Lance, MD;  Location: [facility];  Service: Cardiovascular;  Laterality: N/A;   ESOPHAGOGASTRODUODENOSCOPY (EGD) WITH PROPOFOL N/A 10/09/2016   Dr. Gala Romney: LA grade a esophagitis.  Barrett's esophagus, biopsy-proven.  Small hiatal hernia.  EGD February 2019   ESOPHAGOGASTRODUODENOSCOPY (EGD) WITH PROPOFOL N/A 11/28/2019    salmon-colored esophageal mucosa (Barrett's) small hiatal hernia, portal hypertensive  gastropathy, normal duodenum, 3 year surveillance   ESOPHAGOGASTRODUODENOSCOPY (EGD) WITH PROPOFOL N/A 09/06/2021   three columns of grade 1 varices in distal esophagus, no stigmata of bleeding or red wale signs. Small hiatal hernia. Mild portal gastropathy. Normal duodenum. Repeat upper endoscopy in 1 year for surveillance.   INCISION / DRAINAGE HAND / FINGER     LEFT HEART CATH AND CORONARY ANGIOGRAPHY N/A 04/27/2017   Procedure: LEFT HEART CATH AND CORONARY ANGIOGRAPHY;  Surgeon: Nelva Bush, MD;  Location: [facility];  Service: Cardiovascular;  Laterality: N/A;   LOOP RECORDER INSERTION  06/29/2017   Procedure: Loop Recorder Insertion;  Surgeon: Evans Lance, MD;  Location: [facility];  Service: Cardiovascular;;   POLYPECTOMY  11/24/2016   Procedure: POLYPECTOMY;  Surgeon: Daneil Dolin, MD;  [facility];  Service: Endoscopy;;  descending and sigmoid   POLYPECTOMY  10/18/2021   Procedure: POLYPECTOMY;  Surgeon: Eloise Harman, DO;  [facility];  Service: Endoscopy;;   TOTAL HIP ARTHROPLASTY Right 07/01/2016   TOTAL HIP ARTHROPLASTY Right 07/01/2016   Procedure: RIGHT TOTAL HIP ARTHROPLASTY ANTERIOR APPROACH;  Surgeon: Mcarthur Rossetti, MD;  Location: [facility];  Service: Orthopedics;  Laterality: Right;    Social History   Socioeconomic History   Marital status: Divorced    Spouse name: alice   Number of children: 3   Years of education: 12   Highest education level: Not on file  Occupational History   Occupation: SSI  Tobacco Use   Smoking status: Every Day    Packs/day: 1.00    Years: 46.00    Total pack years: 46.00    Types: Cigarettes    Passive exposure: Never   Smokeless tobacco: Former    Types: Chew   Tobacco comments:    Smokes 5 pack a day of cigarettes a week. 09/25/22 [place]  Vaping Use   Vaping Use: Former  Substance and Sexual Activity   Alcohol use: Not Currently    Comment: couple beers occ- stopped  09/2021   Drug use: No    Comment: history of drug use- marijuana, cocaine- quit 2014   Sexual activity: Yes    Partners: Female    Birth control/protection: None  Other Topics Concern   Not on file  Social History Narrative   Lives with girlfriend [name]   3 children, but 1 OD  In 2019.   Grandchildren-2      Two cats: may and princess; and a dog-foxy      Enjoy: spending time with pets, TV, yard work       Diet: eats all food groups   Caffeine: half and half coffee, some tea-half and half decaf   Water: 6-8 cups daily      Wears seat belt   Does not use phone while driving   Smoke detectors at home      Social Determinants of Health   Financial Resource Strain: Low Risk  (10/28/2021)   Overall Financial Resource Strain (CARDIA)    Difficulty of Paying Living Expenses: Not hard at all  Food Insecurity: No Food Insecurity (01/23/2022)   Hunger Vital Sign    Worried About Running Out of Food in the Last Year: Never true    [place] in the Last Year: Never true  Transportation Needs: No Transportation Needs (01/23/2022)   PRAPARE - Hydrologist (Medical): No    Lack of Transportation (Non-Medical): No  Physical Activity: Inactive (10/28/2021)   Exercise Vital Sign    Days of Exercise per Week: 0 days    Minutes of Exercise per Session: 0 min  Stress: No Stress Concern Present (10/28/2021)   [place]    Feeling of Stress : Not at all  Social Connections: Moderately Integrated (10/28/2021)   Social Connection and Isolation Panel [NHANES]    Frequency of Communication with Friends and Family: More than three times a week    Frequency of Social Gatherings with Friends and Family: Twice a week    Attends Religious Services: More than 4 times per year    Active Member of [employer] or Organizations: Yes    Attends Archivist Meetings: More than 4 times per year    Marital  Status: Divorced  Intimate Partner Violence: Not At Risk (10/28/2021)  Humiliation, Afraid, Rape, and Kick questionnaire    Fear of Current or Ex-Partner: No    Emotionally Abused: No    Physically Abused: No    Sexually Abused: No     Allergies  Allergen Reactions   Carvedilol Other (See Comments)    Sinus pause on telemetry >3 seconds. Longest one 9 sec. No AV nodal agent   Lisinopril Anaphylaxis, Shortness Of Breath and Swelling    Angioedema, required intubation and mechanical ventilation   Chantix [Varenicline] Other (See Comments)    Nightmares and unable to sleep well   Amoxicillin Nausea Only    Did it involve swelling of the face/tongue/throat, SOB, or low BP? No Did it involve sudden or severe rash/hives, skin peeling, or any reaction on the inside of your mouth or nose? No Did you need to seek medical attention at a hospital or doctor's office? No When did it last happen? childhood reaction      If all above answers are "NO", may proceed with cephalosporin use.      Outpatient Medications Prior to Visit  Medication Sig Dispense Refill   albuterol (PROVENTIL) (2.5 MG/3ML) 0.083% nebulizer solution Take 3 mLs (2.5 mg total) by nebulization every 6 (six) hours as needed for wheezing or shortness of breath. 150 mL 1   albuterol (VENTOLIN HFA) 108 (90 Base) MCG/ACT inhaler INHALE 2 PUFFS INTO THE LUNGS EVERY 6 HOURS AS NEEDEDFOR SHORTNESS OF BREATH OR WHEEZING. 18 g 1   azelastine (ASTELIN) 0.1 % nasal spray USE 2 SPRAYS IN EACH NOSTRIL TWICE DAILY. 30 mL 11   budesonide-formoterol (SYMBICORT) 160-4.5 MCG/ACT inhaler Take 2 puffs first thing in am and then another 2 puffs about 12 hours later. 1 each 12   acetaminophen (TYLENOL) 500 MG tablet Take 2 tablets (1,000 mg total) by mouth every 8 (eight) hours as needed for mild pain or fever.     amLODipine (NORVASC) 10 MG tablet TAKE 1 TABLET BY MOUTH ONCE A DAY. 90 tablet 1   ascorbic acid (VITAMIN C) 500 MG tablet Take 1 tablet  (500 mg total) by mouth daily. 30 tablet 1   aspirin 81 MG EC tablet Take 1 tablet (81 mg total) by mouth daily. (Patient taking differently: Take 81 mg by mouth in the morning and at bedtime.) 30 tablet    atorvastatin (LIPITOR) 80 MG tablet Take 1 tablet by mouth once daily 90 tablet 0   Blood Glucose Monitoring Suppl (ACCU-CHEK GUIDE ME) w/Device KIT 1 Piece by Does not apply route as directed. 1 kit 0   Cholecalciferol (VITAMIN D3) 125 MCG (5000 UT) CAPS Take 1 capsule (5,000 Units total) by mouth daily. 90 capsule 0   citalopram (CELEXA) 20 MG tablet Take 1 tablet (20 mg total) by mouth daily. 90 tablet 3   cycloSPORINE (RESTASIS) 0.05 % ophthalmic emulsion Place 1 drop into both eyes 2 (two) times daily.     Dulaglutide (TRULICITY) 1.5 RU/0.4VW SOPN INJECT 1.5 MG INTO THE SKIN ONCE WEEKLY. 6 mL 0   FARXIGA 10 MG TABS tablet TAKE ONE TABLET BY MOUTH ONCE DAILY. 30 tablet 11   ferrous sulfate 325 (65 FE) MG tablet Take 1 tablet (325 mg total) by mouth daily with breakfast. 90 tablet 1   furosemide (LASIX) 40 MG tablet Take 1 tablet (40 mg total) by mouth daily as needed for fluid. 30 tablet 11   glipiZIDE (GLUCOTROL XL) 5 MG 24 hr tablet TAKE 1 TABLET BY MOUTH DAILY WITH BREAKFAST. 90 tablet  0   glucose blood (ACCU-CHEK GUIDE) test strip Use to test glucose 2 times daily. 100 each 2   lactulose (CHRONULAC) 10 GM/15ML solution TAKE 15 MLS BY MOUTH ONCE DAILY. 473 mL 0   magnesium gluconate (MAGONATE) 500 MG tablet Take 500 mg by mouth 2 (two) times daily.     metFORMIN (GLUCOPHAGE) 500 MG tablet TAKE 1 TABLET BY MOUTH TWICE DAILY WITH MEALS. 60 tablet 3   mometasone (ELOCON) 0.1 % cream Apply 1 application topically daily as needed (eczema on ears).     olopatadine (PATANOL) 0.1 % ophthalmic solution Place 1 drop into both eyes 2 (two) times daily.     pantoprazole (PROTONIX) 40 MG tablet TAKE (1) TABLET BY MOUTH TWICE DAILY BEFORE A MEAL. 180 tablet 3   varenicline (CHANTIX CONTINUING MONTH  PAK) 1 MG tablet Take 1 tablet (1 mg total) by mouth daily. 30 tablet 1   varenicline (CHANTIX) 0.5 MG tablet Take 1 tablet (0.5 mg total) by mouth daily. 60 tablet 0   No facility-administered medications prior to visit.       Objective:   General appearance: 57 y.o., male, NAD, conversant  Eyes: anicteric sclerae, moist conjunctivae; no lid-lag; PERRLA, HENT: NCAT; oropharynx, MMM, no mucosal ulcerations Neck: Trachea midline; FROM, Lungs: CTAB, no crackles, no wheeze, CV: RRR, S1, S2, no MRGs  Abdomen: Soft, non-tender; non-distended, BS present  Extremities: No peripheral edema, radial and DP pulses present bilaterally  Skin: Normal temperature, turgor and texture; no rash Psych: Appropriate affect Neuro: Alert and oriented to person and place, no focal deficit      Vitals:   09/25/22 1303  BP: (!) 140/80  Pulse: 80  SpO2: 93%  Weight: 203 lb 3.2 oz (92.2 kg)  Height: 5\' 5"  (1.651 m)    93% on RA BMI Readings from Last 3 Encounters:  09/25/22 33.81 kg/m  09/10/22 33.28 kg/m  08/14/22 33.45 kg/m   Wt Readings from Last 3 Encounters:  09/25/22 203 lb 3.2 oz (92.2 kg)  09/10/22 200 lb (90.7 kg)  08/14/22 201 lb (91.2 kg)     CBC    Component Value Date/Time   WBC 6.9 02/17/2022 0811   WBC 4.8 09/06/2021 0704   RBC 5.81 (H) 02/17/2022 0811   RBC 2.86 (L) 09/06/2021 0704   HGB 16.1 02/17/2022 0811   HCT 48.4 02/17/2022 0811   PLT 197 02/17/2022 0811   MCV 83 02/17/2022 0811   MCH 27.7 02/17/2022 0811   MCH 30.4 09/06/2021 0704   MCHC 33.3 02/17/2022 0811   MCHC 32.1 09/06/2021 0704   RDW 17.1 (H) 02/17/2022 0811   LYMPHSABS 1.8 09/13/2021 0955   MONOABS 0.5 09/05/2021 0415   EOSABS 0.2 09/13/2021 0955   BASOSABS 0.0 09/13/2021 0955      Chest Imaging: 07/18/22 PET/CT reviewed by me with weakly avid RUL 1cm nodule  Pulmonary Functions Testing Results:    Latest Ref Rng & Units 08/20/2022   10:50 AM 07/13/2015    8:44 AM  PFT Results   FVC-Pre L 2.92  2.74   FVC-Predicted Pre % 71  64   FVC-Post L 2.70  2.64   FVC-Predicted Post % 66  62   Pre FEV1/FVC % % 75  79   Post FEV1/FCV % % 81  83   FEV1-Pre L 2.19  2.16   FEV1-Predicted Pre % 70  65   FEV1-Post L 2.18  2.20   DLCO uncorrected ml/min/mmHg 15.45  15.58   DLCO  [facility]% % 64  60   DLVA Predicted % 84  79   TLC L 6.33  4.55   TLC % Predicted % 105  76   RV % Predicted % 185  97      Echocardiogram 08/05/22:   1. Left ventricular ejection fraction, by estimation, is 50 to 55%. The  left ventricle has low normal function. The left ventricle has no regional  wall motion abnormalities. Left ventricular diastolic parameters are  indeterminate.   2. Right ventricular systolic function is normal. The right ventricular  size is normal.   3. The mitral valve is normal in structure. No evidence of mitral valve  regurgitation. No evidence of mitral stenosis.   4. The aortic valve is tricuspid. There is mild calcification of the  aortic valve. There is mild thickening of the aortic valve. Aortic valve  regurgitation is not visualized. No aortic stenosis is present.   5. The inferior vena cava is normal in size with greater than 50%  respiratory variability, suggesting right atrial pressure of 3 mmHg.      Assessment & Plan:   Right upper lobe pulmonary nodule, abnormal PET scan lung right side Gold COPD, grade 2 Continued smoking  Plan: Based on the patient's consultation was cardiothoracic surgery we discussed risk benefits alternatives of surgery versus bronchoscopy plus radiation treatments. The decision was made for bronchoscopy and SBRT. Due to his other medical comorbidities felt like the surgery would be too high risk. Continue Symbicort for COPD management Tentative bronchoscopy date will be 10/10/2022 If this date does not work we will go to 10/16/2022.   Garner Nash, DO [facility] 09/25/2022 1:12 PM

## 2022-09-25 NOTE — Progress Notes (Signed)
Synopsis: Referred for pulmonary nodule by Coral Spikes, DO  Subjective:   PATIENT ID: Randy Hebert GENDER: male DOB: 1966-05-30, MRN: [id number]  Chief Complaint  Patient presents with   Follow-up    F/up   11yM with history of COPD, CAD, OSA, alcoholic/nonalcoholic cirrhosis, ?difficult intubation due to vocal cord injury referred for pet avid pulmonary nodule RUL  No recent courses of prednisone for AECOPD.   On level ground he is not limited by DOE. Can climb 1-2 flights of stairs before needs to stop for DOE.   Using symbicort 2 puffs twice daily, rinsing mouth after use.   Otherwise pertinent review of systems is negative.  No clear cut family history of lung cancer. His aunt needed lung transplant but unsure why  He is disabled, worked in past in Science writer. Still smoking 1 ppd. Off alcohol since January 2023. Off cocaine since 8 ya.   OV 09/25/2022: Here today for follow-up after recent consultation with Dr. Roxan Hockey from cardiothoracic surgery.  Due to his medical comorbidities they have opted for decision to pursue navigational bronchoscopy and tissue sampling then referral for SBRT.  Felt that the risk for surgery was too high the patient has decided to move forward with bronchoscopy and radiation referral.  Pending upon the pathology results.  We will get him set up to see Dr. Bjorn Loser for the procedure within the next couple of weeks.  Past Medical History:  Diagnosis Date   ACE inhibitor-aggravated angioedema    Allergy    Angio-edema    Anxiety    Arthritis    Asthma    hip replacement   Back pain    Bradycardia 04/28/2017   Bulging of cervical intervertebral disc    CAD (coronary artery disease)    lateral STEMI 02/06/2015 00% D1 occlusion treated with Promus Premier 2.5 mm x 16 mm DES, 70% ramus stenosis, 40% mid RCA stenosis, 45% distal RCA stenosis, EF 45-50%   CHF (congestive heart failure) (HCC)    COPD (chronic obstructive  pulmonary disease) ([place])    Depression    Diabetes mellitus without complication ([place])    Difficult intubation    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   Dry eye    Dyspnea    Early satiety 09/23/2016   GERD (gastroesophageal reflux disease)    HCAP (healthcare-associated pneumonia) 05/15/2017   Headache    Heart murmur    Hip pain    Hyperlipidemia    Hypertension    Lobar pneumonia ([place]) 05/15/2017   Melena 08/04/2018   MI (myocardial infarction) [facility])    Myocardial infarction ([place])    Neck pain    Non-ST elevation (NSTEMI) myocardial infarction ([place]) 04/27/2017   NSTEMI (non-ST elevated myocardial infarction) ([place]) 04/26/2017   Otitis media    Pleurisy    Pneumonia due to COVID-19 virus 10/07/2019   Rectal bleeding 11/08/2018   Right shoulder pain 03/20/2020   Sinus pause    9 sec sinus pause on telemetry after started on coreg after MI, avoid AV nodal blocking agent   Sleep apnea    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   Arthritis Mother    Heart disease Brother 52       died in sleep   Early death Brother    Diabetes Maternal Uncle    Alzheimer's disease Maternal Grandmother      Past Surgical History:  Procedure Laterality Date   BIOPSY  10/09/2016   Procedure: BIOPSY;  Surgeon: Daneil Dolin, MD;  [facility];  Service: Endoscopy;;   BIOPSY  11/28/2019   Procedure: BIOPSY;  Surgeon: Daneil Dolin, MD;  [facility];  Service: Endoscopy;;   CARDIAC CATHETERIZATION N/A 02/06/2015   Procedure: Left Heart Cath and  Coronary Angiography;  Surgeon: Leonie Man, MD;  Location: [place] [facility];  Service: Cardiovascular;  Laterality: N/A;   CARDIAC CATHETERIZATION N/A 02/06/2015   Procedure: Coronary Stent Intervention;  Surgeon: Leonie Man, MD;  Location: [place] [facility];  Service: Cardiovascular;  Laterality: N/A;   COLONOSCOPY WITH PROPOFOL N/A 10/09/2016   Sigmoid and descending colon diverticulosis, four 4-6 mm polyps in sigmoid, one 4 mm polyp in descending. Tubular adenomas and hyperplastic. 5 year surveillance.    COLONOSCOPY WITH PROPOFOL N/A 11/24/2016   Sigmoid and descending colon diverticulosis, four 4-6 mm polyps in sigmoid, one 4 mm polyp in descending. Tubular adenomas and hyperplastic. 5 year surveillance.    COLONOSCOPY WITH PROPOFOL N/A 10/18/2021   [place]: nonbleeding internal hemorrhoids, small and large mouth diverticula found in the sigmoid, descending, transverse colon.  A 9 mm sessile polyp was found in the ascending colon that was removed.  4 sessile polyps found in the sigmoid, descending, transverse colon 3 to 5 mm in size, examining otherwise.  Path revealed tubular adenomas.  Repeat due in 5 years for surveillance.   CORONARY ANGIOPLASTY WITH STENT PLACEMENT  01/2015   CORONARY STENT INTERVENTION N/A 04/27/2017   Procedure: CORONARY STENT INTERVENTION;  Surgeon: Nelva Bush, MD;  Location: [facility];  Service: Cardiovascular;  Laterality: N/A;   ELECTROPHYSIOLOGY STUDY N/A 06/29/2017   Procedure: ELECTROPHYSIOLOGY STUDY;  Surgeon: Evans Lance, MD;  Location: [place] [facility];  Service: Cardiovascular;  Laterality: N/A;   ESOPHAGOGASTRODUODENOSCOPY (EGD) WITH PROPOFOL N/A 10/09/2016   Dr. Gala Romney: LA grade a esophagitis.  Barrett's esophagus, biopsy-proven.  Small hiatal hernia.  EGD February 2019   ESOPHAGOGASTRODUODENOSCOPY (EGD) WITH PROPOFOL N/A 11/28/2019    salmon-colored esophageal mucosa (Barrett's) small hiatal hernia, portal hypertensive  gastropathy, normal duodenum, 3 year surveillance   ESOPHAGOGASTRODUODENOSCOPY (EGD) WITH PROPOFOL N/A 09/06/2021   three columns of grade 1 varices in distal esophagus, no stigmata of bleeding or red wale signs. Small hiatal hernia. Mild portal gastropathy. Normal duodenum. Repeat upper endoscopy in 1 year for surveillance.   INCISION / DRAINAGE HAND / FINGER     LEFT HEART CATH AND CORONARY ANGIOGRAPHY N/A 04/27/2017   Procedure: LEFT HEART CATH AND CORONARY ANGIOGRAPHY;  Surgeon: Nelva Bush, MD;  Location: [place] [facility];  Service: Cardiovascular;  Laterality: N/A;   LOOP RECORDER INSERTION  06/29/2017   Procedure: Loop Recorder Insertion;  Surgeon: Evans Lance, MD;  Location: [place] [facility];  Service: Cardiovascular;;   POLYPECTOMY  11/24/2016   Procedure: POLYPECTOMY;  Surgeon: Daneil Dolin, MD;  [facility];  Service: Endoscopy;;  descending and sigmoid   POLYPECTOMY  10/18/2021   Procedure: POLYPECTOMY;  Surgeon: Eloise Harman, DO;  [facility];  Service: Endoscopy;;   TOTAL HIP ARTHROPLASTY Right 07/01/2016   TOTAL HIP ARTHROPLASTY Right 07/01/2016   Procedure: RIGHT TOTAL HIP ARTHROPLASTY ANTERIOR APPROACH;  Surgeon: Mcarthur Rossetti, MD;  Location: [place];  Service: Orthopedics;  Laterality: Right;    Social History   Socioeconomic History   Marital status: Divorced    Spouse name: Randy Hebert   Number of children: 3   Years of education: 12   Highest education level: Not on file  Occupational History   Occupation: SSI  Tobacco Use   Smoking status: Every Day    Packs/day: 1.00    Years: 46.00    Total pack years: 46.00    Types: Cigarettes    Passive exposure: Never   Smokeless tobacco: Former    Types: Chew   Tobacco comments:    Smokes 5 pack a day of cigarettes a week. 09/25/22 [place]  Vaping Use   Vaping Use: Former  Substance and Sexual Activity   Alcohol use: Not Currently    Comment: couple beers occ- stopped  09/2021   Drug use: No    Comment: history of drug use- marijuana, cocaine- quit 2014   Sexual activity: Yes    Partners: Female    Birth control/protection: None  Other Topics Concern   Not on file  Social History Narrative   Lives with girlfriend Randy Hebert   3 children, but 1 OD  In 2019.   Grandchildren-2      Two cats: may and princess; and a dog-foxy      Enjoy: spending time with pets, TV, yard work       Diet: eats all food groups   Caffeine: half and half coffee, some tea-half and half decaf   Water: 6-8 cups daily      Wears seat belt   Does not use phone while driving   Smoke detectors at home      Social Determinants of Health   Financial Resource Strain: Low Risk  (10/28/2021)   Overall Financial Resource Strain (CARDIA)    Difficulty of Paying Living Expenses: Not hard at all  Food Insecurity: No Food Insecurity (01/23/2022)   Hunger Vital Sign    Worried About Running Out of Food in the Last Year: Never true    [place] in the Last Year: Never true  Transportation Needs: No Transportation Needs (01/23/2022)   PRAPARE - Hydrologist (Medical): No    Lack of Transportation (Non-Medical): No  Physical Activity: Inactive (10/28/2021)   Exercise Vital Sign    Days of Exercise per Week: 0 days    Minutes of Exercise per Session: 0 min  Stress: No Stress Concern Present (10/28/2021)   Level Green    Feeling of Stress : Not at all  Social Connections: Moderately Integrated (10/28/2021)   Social Connection and Isolation Panel [NHANES]    Frequency of Communication with Friends and Family: More than three times a week    Frequency of Social Gatherings with Friends and Family: Twice a week    Attends Religious Services: More than 4 times per year    Active Member of [employer] or Organizations: Yes    Attends Archivist Meetings: More than 4 times per year    Marital  Status: Divorced  Intimate Partner Violence: Not At Risk (10/28/2021)  Humiliation, Afraid, Rape, and Kick questionnaire    Fear of Current or Ex-Partner: No    Emotionally Abused: No    Physically Abused: No    Sexually Abused: No     Allergies  Allergen Reactions   Carvedilol Other (See Comments)    Sinus pause on telemetry >3 seconds. Longest one 9 sec. No AV nodal agent   Lisinopril Anaphylaxis, Shortness Of Breath and Swelling    Angioedema, required intubation and mechanical ventilation   Chantix [Varenicline] Other (See Comments)    Nightmares and unable to sleep well   Amoxicillin Nausea Only    Did it involve swelling of the face/tongue/throat, SOB, or low BP? No Did it involve sudden or severe rash/hives, skin peeling, or any reaction on the inside of your mouth or nose? No Did you need to seek medical attention at a hospital or doctor's office? No When did it last happen? childhood reaction      If all above answers are "NO", may proceed with cephalosporin use.      Outpatient Medications Prior to Visit  Medication Sig Dispense Refill   albuterol (PROVENTIL) (2.5 MG/3ML) 0.083% nebulizer solution Take 3 mLs (2.5 mg total) by nebulization every 6 (six) hours as needed for wheezing or shortness of breath. 150 mL 1   albuterol (VENTOLIN HFA) 108 (90 Base) MCG/ACT inhaler INHALE 2 PUFFS INTO THE LUNGS EVERY 6 HOURS AS NEEDEDFOR SHORTNESS OF BREATH OR WHEEZING. 18 g 1   azelastine (ASTELIN) 0.1 % nasal spray USE 2 SPRAYS IN EACH NOSTRIL TWICE DAILY. 30 mL 11   budesonide-formoterol (SYMBICORT) 160-4.5 MCG/ACT inhaler Take 2 puffs first thing in am and then another 2 puffs about 12 hours later. 1 each 12   acetaminophen (TYLENOL) 500 MG tablet Take 2 tablets (1,000 mg total) by mouth every 8 (eight) hours as needed for mild pain or fever.     amLODipine (NORVASC) 10 MG tablet TAKE 1 TABLET BY MOUTH ONCE A DAY. 90 tablet 1   ascorbic acid (VITAMIN C) 500 MG tablet Take 1 tablet  (500 mg total) by mouth daily. 30 tablet 1   aspirin 81 MG EC tablet Take 1 tablet (81 mg total) by mouth daily. (Patient taking differently: Take 81 mg by mouth in the morning and at bedtime.) 30 tablet    atorvastatin (LIPITOR) 80 MG tablet Take 1 tablet by mouth once daily 90 tablet 0   Blood Glucose Monitoring Suppl (ACCU-CHEK GUIDE ME) w/Device KIT 1 Piece by Does not apply route as directed. 1 kit 0   Cholecalciferol (VITAMIN D3) 125 MCG (5000 UT) CAPS Take 1 capsule (5,000 Units total) by mouth daily. 90 capsule 0   citalopram (CELEXA) 20 MG tablet Take 1 tablet (20 mg total) by mouth daily. 90 tablet 3   cycloSPORINE (RESTASIS) 0.05 % ophthalmic emulsion Place 1 drop into both eyes 2 (two) times daily.     Dulaglutide (TRULICITY) 1.5 OI/7.8MV SOPN INJECT 1.5 MG INTO THE SKIN ONCE WEEKLY. 6 mL 0   FARXIGA 10 MG TABS tablet TAKE ONE TABLET BY MOUTH ONCE DAILY. 30 tablet 11   ferrous sulfate 325 (65 FE) MG tablet Take 1 tablet (325 mg total) by mouth daily with breakfast. 90 tablet 1   furosemide (LASIX) 40 MG tablet Take 1 tablet (40 mg total) by mouth daily as needed for fluid. 30 tablet 11   glipiZIDE (GLUCOTROL XL) 5 MG 24 hr tablet TAKE 1 TABLET BY MOUTH DAILY WITH BREAKFAST. 90 tablet  0   glucose blood (ACCU-CHEK GUIDE) test strip Use to test glucose 2 times daily. 100 each 2   lactulose (CHRONULAC) 10 GM/15ML solution TAKE 15 MLS BY MOUTH ONCE DAILY. 473 mL 0   magnesium gluconate (MAGONATE) 500 MG tablet Take 500 mg by mouth 2 (two) times daily.     metFORMIN (GLUCOPHAGE) 500 MG tablet TAKE 1 TABLET BY MOUTH TWICE DAILY WITH MEALS. 60 tablet 3   mometasone (ELOCON) 0.1 % cream Apply 1 application topically daily as needed (eczema on ears).     olopatadine (PATANOL) 0.1 % ophthalmic solution Place 1 drop into both eyes 2 (two) times daily.     pantoprazole (PROTONIX) 40 MG tablet TAKE (1) TABLET BY MOUTH TWICE DAILY BEFORE A MEAL. 180 tablet 3   varenicline (CHANTIX CONTINUING MONTH  PAK) 1 MG tablet Take 1 tablet (1 mg total) by mouth daily. 30 tablet 1   varenicline (CHANTIX) 0.5 MG tablet Take 1 tablet (0.5 mg total) by mouth daily. 60 tablet 0   No facility-administered medications prior to visit.       Objective:   General appearance: 57 y.o., male, NAD, conversant  Eyes: anicteric sclerae, moist conjunctivae; no lid-lag; PERRLA, HENT: NCAT; oropharynx, MMM, no mucosal ulcerations Neck: Trachea midline; FROM, Lungs: CTAB, no crackles, no wheeze, CV: RRR, S1, S2, no MRGs  Abdomen: Soft, non-tender; non-distended, BS present  Extremities: No peripheral edema, radial and DP pulses present bilaterally  Skin: Normal temperature, turgor and texture; no rash Psych: Appropriate affect Neuro: Alert and oriented to person and place, no focal deficit      Vitals:   09/25/22 1303  BP: (!) 140/80  Pulse: 80  SpO2: 93%  Weight: 203 lb 3.2 oz (92.2 kg)  Height: 5\' 5"  (1.651 m)    93% on RA BMI Readings from Last 3 Encounters:  09/25/22 33.81 kg/m  09/10/22 33.28 kg/m  08/14/22 33.45 kg/m   Wt Readings from Last 3 Encounters:  09/25/22 203 lb 3.2 oz (92.2 kg)  09/10/22 200 lb (90.7 kg)  08/14/22 201 lb (91.2 kg)     CBC    Component Value Date/Time   WBC 6.9 02/17/2022 0811   WBC 4.8 09/06/2021 0704   RBC 5.81 (H) 02/17/2022 0811   RBC 2.86 (L) 09/06/2021 0704   HGB 16.1 02/17/2022 0811   HCT 48.4 02/17/2022 0811   PLT 197 02/17/2022 0811   MCV 83 02/17/2022 0811   MCH 27.7 02/17/2022 0811   MCH 30.4 09/06/2021 0704   MCHC 33.3 02/17/2022 0811   MCHC 32.1 09/06/2021 0704   RDW 17.1 (H) 02/17/2022 0811   LYMPHSABS 1.8 09/13/2021 0955   MONOABS 0.5 09/05/2021 0415   EOSABS 0.2 09/13/2021 0955   BASOSABS 0.0 09/13/2021 0955      Chest Imaging: 07/18/22 PET/CT reviewed by me with weakly avid RUL 1cm nodule  Pulmonary Functions Testing Results:    Latest Ref Rng & Units 08/20/2022   10:50 AM 07/13/2015    8:44 AM  PFT Results   FVC-Pre L 2.92  2.74   FVC-Predicted Pre % 71  64   FVC-Post L 2.70  2.64   FVC-Predicted Post % 66  62   Pre FEV1/FVC % % 75  79   Post FEV1/FCV % % 81  83   FEV1-Pre L 2.19  2.16   FEV1-Predicted Pre % 70  65   FEV1-Post L 2.18  2.20   DLCO uncorrected ml/min/mmHg 15.45  15.58   DLCO  [facility]% % 64  60   DLVA Predicted % 84  79   TLC L 6.33  4.55   TLC % Predicted % 105  76   RV % Predicted % 185  97      Echocardiogram 08/05/22:   1. Left ventricular ejection fraction, by estimation, is 50 to 55%. The  left ventricle has low normal function. The left ventricle has no regional  wall motion abnormalities. Left ventricular diastolic parameters are  indeterminate.   2. Right ventricular systolic function is normal. The right ventricular  size is normal.   3. The mitral valve is normal in structure. No evidence of mitral valve  regurgitation. No evidence of mitral stenosis.   4. The aortic valve is tricuspid. There is mild calcification of the  aortic valve. There is mild thickening of the aortic valve. Aortic valve  regurgitation is not visualized. No aortic stenosis is present.   5. The inferior vena cava is normal in size with greater than 50%  respiratory variability, suggesting right atrial pressure of 3 mmHg.      Assessment & Plan:   Right upper lobe pulmonary nodule, abnormal PET scan lung right side Gold COPD, grade 2 Continued smoking  Plan: Based on the patient's consultation was cardiothoracic surgery we discussed risk benefits alternatives of surgery versus bronchoscopy plus radiation treatments. The decision was made for bronchoscopy and SBRT. Due to his other medical comorbidities felt like the surgery would be too high risk. Continue Symbicort for COPD management Tentative bronchoscopy date will be 10/10/2022 If this date does not work we will go to 10/16/2022.   Garner Nash, DO [facility] 09/25/2022 1:12 PM

## 2022-09-25 NOTE — Addendum Note (Signed)
Addended by: Alvin Critchley on: 09/25/2022 01:30 PM   Modules accepted: Orders

## 2022-09-25 NOTE — Patient Instructions (Signed)
Thank you for visiting Dr. Valeta Harms at [facility]. Today we recommend the following:  Orders Placed This Encounter  Procedures   Procedural/ Surgical Case Request: ROBOTIC ASSISTED NAVIGATIONAL BRONCHOSCOPY   Ambulatory referral to Pulmonology   Tentative bronch date on 10/10/2022  Return in about 22 days (around 10/17/2022) for w/ Dr. Verlee Monte.    Please do your part to reduce the spread of COVID-19.

## 2022-09-29 DIAGNOSIS — K922 Gastrointestinal hemorrhage, unspecified: Secondary | ICD-10-CM | POA: Diagnosis not present

## 2022-09-29 DIAGNOSIS — K219 Gastro-esophageal reflux disease without esophagitis: Secondary | ICD-10-CM | POA: Diagnosis not present

## 2022-10-01 LAB — PROTIME-INR
INR: 1.1
Prothrombin Time: 11.1 s (ref 9.0–11.5)

## 2022-10-01 LAB — COMPREHENSIVE METABOLIC PANEL
AG Ratio: 1.5 (calc) (ref 1.0–2.5)
ALT: 44 U/L (ref 9–46)
AST: 32 U/L (ref 10–35)
Albumin: 4.4 g/dL (ref 3.6–5.1)
Alkaline phosphatase (APISO): 122 U/L (ref 35–144)
BUN/Creatinine Ratio: 18 (calc) (ref 6–22)
BUN: 12 mg/dL (ref 7–25)
CO2: 24 mmol/L (ref 20–32)
Calcium: 9.6 mg/dL (ref 8.6–10.3)
Chloride: 101 mmol/L (ref 98–110)
Creat: 0.68 mg/dL — ABNORMAL LOW (ref 0.70–1.30)
Globulin: 3 g/dL (calc) (ref 1.9–3.7)
Glucose, Bld: 122 mg/dL — ABNORMAL HIGH (ref 65–99)
Potassium: 4.1 mmol/L (ref 3.5–5.3)
Sodium: 136 mmol/L (ref 135–146)
Total Bilirubin: 1.3 mg/dL — ABNORMAL HIGH (ref 0.2–1.2)
Total Protein: 7.4 g/dL (ref 6.1–8.1)

## 2022-10-01 LAB — CBC WITH DIFFERENTIAL/PLATELET
Absolute Monocytes: 631 cells/uL (ref 200–950)
Basophils Absolute: 50 cells/uL (ref 0–200)
Basophils Relative: 0.6 %
Eosinophils Absolute: 232 cells/uL (ref 15–500)
Eosinophils Relative: 2.8 %
HCT: 46.9 % (ref 38.5–50.0)
Hemoglobin: 16.5 g/dL (ref 13.2–17.1)
Lymphs Abs: 1635 cells/uL (ref 850–3900)
MCH: 30.5 pg (ref 27.0–33.0)
MCHC: 35.2 g/dL (ref 32.0–36.0)
MCV: 86.7 fL (ref 80.0–100.0)
MPV: 10 fL (ref 7.5–12.5)
Monocytes Relative: 7.6 %
Neutro Abs: 5752 cells/uL (ref 1500–7800)
Neutrophils Relative %: 69.3 %
Platelets: 180 10*3/uL (ref 140–400)
RBC: 5.41 10*6/uL (ref 4.20–5.80)
RDW: 14 % (ref 11.0–15.0)
Total Lymphocyte: 19.7 %
WBC: 8.3 10*3/uL (ref 3.8–10.8)

## 2022-10-01 LAB — HEPATITIS A ANTIBODY, TOTAL: Hepatitis A AB,Total: NONREACTIVE

## 2022-10-01 LAB — HEPATITIS B SURFACE ANTIBODY,QUALITATIVE: Hep B S Ab: NONREACTIVE

## 2022-10-01 LAB — AFP TUMOR MARKER: AFP-Tumor Marker: 3 ng/mL (ref <6.1)

## 2022-10-07 ENCOUNTER — Telehealth: Payer: Self-pay | Admitting: Internal Medicine

## 2022-10-07 ENCOUNTER — Encounter: Payer: Medicare Other | Admitting: Thoracic Surgery (Cardiothoracic Vascular Surgery)

## 2022-10-07 DIAGNOSIS — K7460 Unspecified cirrhosis of liver: Secondary | ICD-10-CM

## 2022-10-07 DIAGNOSIS — K824 Cholesterolosis of gallbladder: Secondary | ICD-10-CM

## 2022-10-07 NOTE — Telephone Encounter (Signed)
Patient left a message that he needed to get an ultrasound scheduled.  He said you could go ahead and make the appointment and just let him know when he is to go

## 2022-10-08 NOTE — Telephone Encounter (Signed)
Called pt, LMOVM with appt details for [place].

## 2022-10-13 ENCOUNTER — Encounter (HOSPITAL_COMMUNITY): Payer: Self-pay

## 2022-10-13 ENCOUNTER — Ambulatory Visit (HOSPITAL_COMMUNITY)
Admission: RE | Admit: 2022-10-13 | Discharge: 2022-10-13 | Disposition: A | Discharge status: Home / Self Care | Payer: 59 | Source: Ambulatory Visit | Attending: Student | Admitting: Student

## 2022-10-13 DIAGNOSIS — J439 Emphysema, unspecified: Secondary | ICD-10-CM | POA: Diagnosis not present

## 2022-10-13 DIAGNOSIS — R918 Other nonspecific abnormal finding of lung field: Secondary | ICD-10-CM | POA: Diagnosis not present

## 2022-10-13 DIAGNOSIS — R911 Solitary pulmonary nodule: Secondary | ICD-10-CM

## 2022-10-14 ENCOUNTER — Other Ambulatory Visit: Payer: 59

## 2022-10-14 ENCOUNTER — Encounter (HOSPITAL_COMMUNITY): Payer: Self-pay | Admitting: Student

## 2022-10-14 ENCOUNTER — Other Ambulatory Visit: Payer: Self-pay

## 2022-10-14 DIAGNOSIS — F17210 Nicotine dependence, cigarettes, uncomplicated: Secondary | ICD-10-CM | POA: Diagnosis not present

## 2022-10-14 DIAGNOSIS — M4712 Other spondylosis with myelopathy, cervical region: Secondary | ICD-10-CM | POA: Diagnosis not present

## 2022-10-14 DIAGNOSIS — M4722 Other spondylosis with radiculopathy, cervical region: Secondary | ICD-10-CM | POA: Diagnosis not present

## 2022-10-14 DIAGNOSIS — R911 Solitary pulmonary nodule: Secondary | ICD-10-CM

## 2022-10-14 DIAGNOSIS — M4802 Spinal stenosis, cervical region: Secondary | ICD-10-CM | POA: Diagnosis not present

## 2022-10-14 NOTE — Progress Notes (Signed)
Spoke with pt for pre-op call. Pt has extensive heart history, Diabetes and HTN. Pt does use a Cpap for his sleep apnea. Pt's most recent A1C was 6.7 on 08/28/22. Pt takes Trulicity and he takes on Monday. His last dose was 10/06/22. Instructed pt to hold his [place] as of today until after procedure. Pt instructed not to take Metformin or Glipizide day of procedure.   Shower instructions given to pt and he voiced understanding.   Chart sent to Anesthesia of review.

## 2022-10-15 NOTE — Progress Notes (Signed)
Anesthesia Chart Review: Same-day workup  57 year old male for robotic assisted navigational bronchoscopy 10/16/2022 with Dr. Hulan Fray with cardiology for history of CAD s/p NSTEMI treated with DES to D1 2016 and proximal ramus intermedius 2018, HFpEF, HTN, HLD, history of syncope with bradycardia ~2019 (loop recorder was placed, no arrhythmia detected).  Last seen by Dr. [name] 07/03/2022 for preop evaluation prior to undergoing cervical disc surgery.  He was noted to have recent episodes of feeling hot, flushed, sweaty, shortness of breath.  Due to concern for atypical angina and nuclear stress test was ordered.  Stress test 07/16/2022 showed findings consistent with prior anterolateral/lateral/inferolateral myocardial infarction, no current ischemia, high risk based on decreased LVEF cardiology commented on result stating, "stress test shows some old damage to the heart from prior blockages, no new blockages.  Stress test did suggest heart pumping function is decreased, it is not graded measuring this but would like to get an echo to better evaluate heart pumping function please order echo for CAD.". TTE 08/05/2022 showed LVEF 50 to 55%, RV normal size and function, no significant valvular abnormalities.   History of cirrhosis secondary to alcohol abuse.  No history of ascites/paracentesis/thoracentesis.  Reportedly drinking 1 pint of liquor per week, down from 1/5 of whiskey per day previously.  Per PET scan 07/17/2022, "morphologic features of the liver compatible with cirrhosis."  He is maintained on lactulose.  Follows with pulmonology for history of tobacco abuse and associated COPD, OSA on CPAP.  Continues to smoke 1 pack/day.  PFTs 08/20/2022 showed, "reduced vital capacity in setting of air trapping, mildly reduced diffusing capacity."  He is maintained on Symbicort and albuterol.  Recent diagnosis of lung mass/probable lung cancer. He had a low-dose CT for lung cancer screening which showed a  new 12 mm nodule in the right apex. Subsequent PET/CT showed the nodule was mildly hypermetabolic with an SUV of 1.63. There is no evidence of regional or distant metastatic disease.    Seen by Dr. Roxan Hockey 09/10/2022 and discussed that he was a high risk surgical candidate for several reasons.  Per note, "He has several issues which make him a very high risk surgical candidate.    1.Extensive history of coronary disease.  He would definitely need cardiology clearance.   2. Cirrhosis.-Seems well compensated at present but still using ethanol.   3. Ongoing tobacco abuse-would need to quit smoking prior to surgery.  Needs to quit smoking prior to neck surgery as well.  4. Difficult airway-given his neck issues and history of difficult airway may be difficult or impossible to get a double-lumen endotracheal tube.  Fortunately ENT did not see any evidence of a paralyzed vocal cord. Given all these issues navigational bronchoscopy for biopsy and stereotactic radiation for treatment might be a better option for him.  He wishes to think about his options and will discuss further when he sees Dr. Verlee Monte in a couple of weeks."  Non-insulin-dependent DM2, A1c 6.7 on 06/23/2022.  He is also on once weekly GLP-1 agonist Trulicity.  Last dose 10/06/2022.  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 He was evaluated by otolaryngologist Dr. Rowe Clack 07/31/2022.  Per note, "I do not see any evidence of vocal fold motion abnormality that would pose an increased risk for issues with their airway perioperatively.  I have asked the patient to see me sooner than later after the surgery if they have any dysphagia or breathy phonation.  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  Cervical stenosis with numbness and tingling of bilateral  hands, decreased grip strength left hand.  C-spine MRI 05/06/2022 showed multilevel moderate spinal canal stenosis with severe spinal canal stenosis noted at C5-6.  He was scheduled for C3-7 ACDF on 09/05/2022 however this was postponed due to recent discovery of lung mass/probable lung cancer and need for further workup.  Reviewed history with anesthesiologist Dr. Gloris Manchester.  He advised the patient to proceed as planned barring acute status change.  Will be evaluated by assigned anesthesiologist on day of surgery.  EKG 09/02/2021: Rhythm.  Rate 98.  Right bundle branch block.  Lateral infarct, old.  CT super D chest 10/13/2022: IMPRESSION: 1. Irregular solid 1.1 cm anterior apical right upper lobe pulmonary nodule, slightly increased in size since 06/25/2022 CT, remaining suspicious for primary bronchogenic carcinoma. 2. No thoracic adenopathy. 3. Patchy indistinct centrilobular micronodularity throughout the mid to upper lungs bilaterally, generally new, generally ground-glass in density, favoring smoking related change (respiratory bronchiolitis). 4. Three-vessel coronary atherosclerosis. 5. Dilated main pulmonary artery, suggesting pulmonary arterial hypertension. 6. Cirrhosis. 7. Right-sided aortic arch. 8. Aortic Atherosclerosis (ICD10-I70.0) and Emphysema (ICD10-J43.9).  TTE 08/05/2022:  1. Left ventricular ejection fraction, by estimation, is 50 to 55%. The  left ventricle has low normal function. The left ventricle has no regional  wall motion abnormalities. Left ventricular diastolic parameters are  indeterminate.   2. Right ventricular systolic function is normal. The right ventricular  size is normal.   3. The mitral valve is normal in structure. No evidence of mitral valve  regurgitation. No evidence of mitral stenosis.   4. The aortic valve is tricuspid. There is mild calcification of the  aortic valve. There is mild thickening of the aortic valve. Aortic valve  regurgitation  is not visualized. No aortic stenosis is present.   5. The inferior vena cava is normal in size with greater than 50%  respiratory variability, suggesting right atrial pressure of 3 mmHg.   Nuclear stress 07/16/2022:   Findings are consistent with prior anterolatera/latera/inferolateral myocardial infarction. There is no current ischemia.  The study is high risk based on decreased LVEF alone,consider correlating with echo.   No ST deviation was noted.   LV perfusion is abnormal. Large anterolateral/lateral/inferolateral l defect moderate to severe intensity that is fixed.   Left ventricular function is abnormal. Nuclear stress EF: 34 %. The left ventricular ejection fraction is moderately decreased (30-44%). End diastolic cavity size is mildly enlarged.    Wynonia Musty [facility]/Anesthesiology Phone [phone] 10/15/2022 12:49 PM

## 2022-10-15 NOTE — Anesthesia Preprocedure Evaluation (Signed)
Anesthesia Evaluation  Patient identified by MRN, date of birth, ID band Patient awake    Reviewed: Allergy & Precautions, NPO status , Patient's Chart, lab work & pertinent test results  History of Anesthesia Complications (+) DIFFICULT AIRWAY and history of anesthetic complications  Airway Mallampati: III  TM Distance: >3 FB Neck ROM: Full   Comment: H/o difficult airway Dental no notable dental hx. (+) Dental Advisory Given, Missing,    Pulmonary shortness of breath (uses oxygen during night), with exertion, lying and Long-Term Oxygen Therapy, asthma , sleep apnea and Continuous Positive Airway Pressure Ventilation , pneumonia (COVID - 10/07/2019), resolved, COPD,  COPD inhaler and oxygen dependent, Current Smoker and Patient abstained from smoking. Oxygen sats - 87 to 91   Room air spo2 - 94 Pulmonary exam normal breath sounds clear to auscultation       Cardiovascular Exercise Tolerance: Poor hypertension, Pt. on medications + CAD, + Past MI, + Cardiac Stents, +CHF and + DOE  Normal cardiovascular exam(-) Cardiac Defibrillator + Valvular Problems/Murmurs  Rhythm:Regular Rate:Normal  1. Left ventricular ejection fraction, by estimation, is 45 to 50%. The left ventricle has mildly decreased function. The left ventricle demonstrates regional wall motion abnormalities (see scoring diagram/findings for description). There is mild left ventricular hypertrophy. Left ventricular diastolic parameters are consistent with Grade I diastolic dysfunction (impaired relaxation).  2. Right ventricular systolic function is normal. The right ventricular size is normal. Tricuspid regurgitation signal is inadequate for assessing PA pressure.  3. Posterior pericardium appears thickened.  4. The mitral valve is grossly normal. Trivial mitral valve  regurgitation.  5. The aortic valve is tricuspid. Aortic valve regurgitation is not visualized. Mild  aortic valve sclerosis is present, with no evidence of aortic valve stenosis.  6. The inferior vena cava is normal in size with greater than 50% respiratory variability, suggesting right atrial pressure of 3 mmHg.   Echo shows some mild ongoing weakness to heart muscle, I have discussed with a a clinical pharmacist who is working in arranging starting a new medication called farxiga that can help strengthen the heart over time  [name] MD 04/2021    Neuro/Psych  Headaches PSYCHIATRIC DISORDERS Anxiety Depression     Neuromuscular disease    GI/Hepatic ,GERD  Medicated,,(+)     substance abuse  alcohol use  Endo/Other  diabetes, Well Controlled, Type 2, Oral Hypoglycemic Agents    Renal/GU      Musculoskeletal  (+) Arthritis ,    Abdominal   Peds  Hematology  (+) Blood dyscrasia, anemia   Anesthesia Other Findings    Reproductive/Obstetrics                             Anesthesia Physical Anesthesia Plan  ASA: 4  Anesthesia Plan: General   Post-op Pain Management: Minimal or no pain anticipated   Induction: Intravenous  PONV Risk Score and Plan: 1 and Ondansetron, Dexamethasone and Treatment may vary due to age or medical condition  Airway Management Planned: Oral ETT and Video Laryngoscope Planned  Additional Equipment: None  Intra-op Plan:   Post-operative Plan: Extubation in OR and Possible Post-op intubation/ventilation  Informed Consent: I have reviewed the patients History and Physical, chart, labs and discussed the procedure including the risks, benefits and alternatives for the proposed anesthesia with the patient or authorized representative who has indicated his/her understanding and acceptance.     Dental advisory given  Plan Discussed with: CRNA and  Anesthesiologist  Anesthesia Plan Comments: (PAT note by Karoline Caldwell, PA-C: 57 year old male for robotic assisted navigational bronchoscopy 10/16/2022 with Dr.  Hulan Fray with cardiology for history of CAD s/p NSTEMI treated with DES to D1 2016 and proximal ramus intermedius 2018, HFpEF, HTN, HLD, history of syncope with bradycardia ~2019 (loop recorder was placed, no arrhythmia detected).  Last seen by Dr. [name] 07/03/2022 for preop evaluation prior to undergoing cervical disc surgery.  He was noted to have recent episodes of feeling hot, flushed, sweaty, shortness of breath.  Due to concern for atypical angina and nuclear stress test was ordered.  Stress test 07/16/2022 showed findings consistent with prior anterolateral/lateral/inferolateral myocardial infarction, no current ischemia, high risk based on decreased LVEF cardiology commented on result stating, "stress test shows some old damage to the heart from prior blockages, no new blockages.  Stress test did suggest heart pumping function is decreased, it is not graded measuring this but would like to get an echo to better evaluate heart pumping function please order echo for CAD.". TTE 08/05/2022 showed LVEF 50 to 55%, RV normal size and function, no significant valvular abnormalities.   History of cirrhosis secondary to alcohol abuse.  No history of ascites/paracentesis/thoracentesis.  Reportedly drinking 1 pint of liquor per week, down from 1/5 of whiskey per day previously.  Per PET scan 07/17/2022, "morphologic features of the liver compatible with cirrhosis."  He is maintained on lactulose.  Follows with pulmonology for history of tobacco abuse and associated COPD, OSA on CPAP.  Continues to smoke 1 pack/day.  PFTs 08/20/2022 showed, "reduced vital capacity in setting of air trapping, mildly reduced diffusing capacity."  He is maintained on Symbicort and albuterol.  Recent diagnosis of lung mass/probable lung cancer. He had a low-dose CT for lung cancer screening which showed a new 12 mm nodule in the right apex. Subsequent PET/CT showed the nodule was mildly hypermetabolic with an SUV of 1.91. There is  no evidence of regional or distant metastatic disease.    Seen by Dr. Roxan Hockey 09/10/2022 and discussed that he was a high risk surgical candidate for several reasons.  Per note, "He has several issues which make him a very high risk surgical candidate.    1.Extensive history of coronary disease.  He would definitely need cardiology clearance.   2. Cirrhosis.-Seems well compensated at present but still using ethanol.   3. Ongoing tobacco abuse-would need to quit smoking prior to surgery.  Needs to quit smoking prior to neck surgery as well.  4. Difficult airway-given his neck issues and history of difficult airway may be difficult or impossible to get a double-lumen endotracheal tube.  Fortunately ENT did not see any evidence of a paralyzed vocal cord. Given all these issues navigational bronchoscopy for biopsy and stereotactic radiation for treatment might be a better option for him.  He wishes to think about his options and will discuss further when he sees Dr. Verlee Monte in a couple of weeks."  Non-insulin-dependent DM2, A1c 6.7 on 06/23/2022.  He is also on once weekly GLP-1 agonist Trulicity.  Last dose 10/06/2022.  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 He was evaluated by otolaryngologist Dr. Rowe Clack 07/31/2022.  Per note, "I do not see any evidence of vocal fold motion abnormality that would pose an increased risk for issues with their airway perioperatively.  I have asked the patient to see me sooner than later after the surgery if they have any dysphagia or breathy  phonation.  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  Cervical stenosis with numbness and tingling of bilateral hands, decreased grip strength left hand.  C-spine MRI 05/06/2022 showed multilevel moderate spinal canal stenosis with severe  spinal canal stenosis noted at C5-6.  He was scheduled for C3-7 ACDF on 09/05/2022 however this was postponed due to recent discovery of lung mass/probable lung cancer and need for further workup.  Reviewed history with anesthesiologist Dr. Gloris Manchester.  He advised the patient to proceed as planned barring acute status change.  Will be evaluated by assigned anesthesiologist on day of surgery.  EKG 09/02/2021: Rhythm.  Rate 98.  Right bundle branch block.  Lateral infarct, old.  CT super D chest 10/13/2022: IMPRESSION: 1. Irregular solid 1.1 cm anterior apical right upper lobe pulmonary nodule, slightly increased in size since 06/25/2022 CT, remaining suspicious for primary bronchogenic carcinoma. 2. No thoracic adenopathy. 3. Patchy indistinct centrilobular micronodularity throughout the mid to upper lungs bilaterally, generally new, generally ground-glass in density, favoring smoking related change (respiratory bronchiolitis). 4. Three-vessel coronary atherosclerosis. 5. Dilated main pulmonary artery, suggesting pulmonary arterial hypertension. 6. Cirrhosis. 7. Right-sided aortic arch. 8. Aortic Atherosclerosis (ICD10-I70.0) and Emphysema (ICD10-J43.9).  TTE 08/05/2022: 1. Left ventricular ejection fraction, by estimation, is 50 to 55%. The  left ventricle has low normal function. The left ventricle has no regional  wall motion abnormalities. Left ventricular diastolic parameters are  indeterminate.  2. Right ventricular systolic function is normal. The right ventricular  size is normal.  3. The mitral valve is normal in structure. No evidence of mitral valve  regurgitation. No evidence of mitral stenosis.  4. The aortic valve is tricuspid. There is mild calcification of the  aortic valve. There is mild thickening of the aortic valve. Aortic valve  regurgitation is not visualized. No aortic stenosis is present.  5. The inferior vena cava is normal in size with greater than 50%   respiratory variability, suggesting right atrial pressure of 3 mmHg.   Nuclear stress 07/16/2022:  Findings are consistent with prior anterolatera/latera/inferolateral myocardial infarction. There is no current ischemia. The study is high risk based on decreased LVEF alone,consider correlating with echo.  No ST deviation was noted.  LV perfusion is abnormal. Large anterolateral/lateral/inferolateral l defect moderate to severe intensity that is fixed.  Left ventricular function is abnormal. Nuclear stress EF: 34 %. The left ventricular ejection fraction is moderately decreased (30-44%). End diastolic cavity size is mildly enlarged.  )        Anesthesia Quick Evaluation

## 2022-10-16 ENCOUNTER — Ambulatory Visit (HOSPITAL_COMMUNITY)
Admission: RE | Admit: 2022-10-16 | Discharge: 2022-10-16 | Disposition: A | Discharge status: Home / Self Care | Payer: 59 | Attending: Student | Admitting: Student

## 2022-10-16 ENCOUNTER — Ambulatory Visit (HOSPITAL_BASED_OUTPATIENT_CLINIC_OR_DEPARTMENT_OTHER): Payer: 59 | Admitting: Physician Assistant

## 2022-10-16 ENCOUNTER — Telehealth: Payer: Self-pay | Admitting: Student

## 2022-10-16 ENCOUNTER — Ambulatory Visit (HOSPITAL_COMMUNITY): Payer: 59

## 2022-10-16 ENCOUNTER — Ambulatory Visit (HOSPITAL_COMMUNITY): Payer: 59 | Admitting: Physician Assistant

## 2022-10-16 ENCOUNTER — Encounter (HOSPITAL_COMMUNITY): Payer: Self-pay | Admitting: Student

## 2022-10-16 ENCOUNTER — Encounter (HOSPITAL_COMMUNITY)
Admission: RE | Disposition: A | Discharge status: Home / Self Care | Payer: Self-pay | Source: Home / Self Care | Attending: Student

## 2022-10-16 ENCOUNTER — Other Ambulatory Visit: Payer: Self-pay

## 2022-10-16 ENCOUNTER — Encounter: Payer: Self-pay | Admitting: *Deleted

## 2022-10-16 DIAGNOSIS — J939 Pneumothorax, unspecified: Secondary | ICD-10-CM | POA: Diagnosis not present

## 2022-10-16 DIAGNOSIS — I5030 Unspecified diastolic (congestive) heart failure: Secondary | ICD-10-CM | POA: Diagnosis not present

## 2022-10-16 DIAGNOSIS — R918 Other nonspecific abnormal finding of lung field: Secondary | ICD-10-CM | POA: Diagnosis not present

## 2022-10-16 DIAGNOSIS — E119 Type 2 diabetes mellitus without complications: Secondary | ICD-10-CM | POA: Insufficient documentation

## 2022-10-16 DIAGNOSIS — I252 Old myocardial infarction: Secondary | ICD-10-CM | POA: Insufficient documentation

## 2022-10-16 DIAGNOSIS — Q2547 Right aortic arch: Secondary | ICD-10-CM | POA: Diagnosis not present

## 2022-10-16 DIAGNOSIS — K7460 Unspecified cirrhosis of liver: Secondary | ICD-10-CM | POA: Diagnosis not present

## 2022-10-16 DIAGNOSIS — K219 Gastro-esophageal reflux disease without esophagitis: Secondary | ICD-10-CM | POA: Insufficient documentation

## 2022-10-16 DIAGNOSIS — Z09 Encounter for follow-up examination after completed treatment for conditions other than malignant neoplasm: Secondary | ICD-10-CM | POA: Diagnosis not present

## 2022-10-16 DIAGNOSIS — R911 Solitary pulmonary nodule: Secondary | ICD-10-CM

## 2022-10-16 DIAGNOSIS — G4733 Obstructive sleep apnea (adult) (pediatric): Secondary | ICD-10-CM | POA: Diagnosis not present

## 2022-10-16 DIAGNOSIS — Z9889 Other specified postprocedural states: Secondary | ICD-10-CM | POA: Diagnosis not present

## 2022-10-16 DIAGNOSIS — I110 Hypertensive heart disease with heart failure: Secondary | ICD-10-CM | POA: Insufficient documentation

## 2022-10-16 DIAGNOSIS — Z7951 Long term (current) use of inhaled steroids: Secondary | ICD-10-CM | POA: Insufficient documentation

## 2022-10-16 DIAGNOSIS — I2510 Atherosclerotic heart disease of native coronary artery without angina pectoris: Secondary | ICD-10-CM | POA: Insufficient documentation

## 2022-10-16 DIAGNOSIS — I509 Heart failure, unspecified: Secondary | ICD-10-CM | POA: Diagnosis not present

## 2022-10-16 DIAGNOSIS — C3411 Malignant neoplasm of upper lobe, right bronchus or lung: Secondary | ICD-10-CM | POA: Insufficient documentation

## 2022-10-16 DIAGNOSIS — J439 Emphysema, unspecified: Secondary | ICD-10-CM | POA: Diagnosis not present

## 2022-10-16 DIAGNOSIS — Z7984 Long term (current) use of oral hypoglycemic drugs: Secondary | ICD-10-CM | POA: Diagnosis not present

## 2022-10-16 DIAGNOSIS — Z9981 Dependence on supplemental oxygen: Secondary | ICD-10-CM | POA: Insufficient documentation

## 2022-10-16 DIAGNOSIS — F418 Other specified anxiety disorders: Secondary | ICD-10-CM | POA: Insufficient documentation

## 2022-10-16 DIAGNOSIS — Z9989 Dependence on other enabling machines and devices: Secondary | ICD-10-CM | POA: Diagnosis not present

## 2022-10-16 DIAGNOSIS — Z8616 Personal history of COVID-19: Secondary | ICD-10-CM | POA: Insufficient documentation

## 2022-10-16 DIAGNOSIS — F17210 Nicotine dependence, cigarettes, uncomplicated: Secondary | ICD-10-CM | POA: Diagnosis not present

## 2022-10-16 HISTORY — PX: FIDUCIAL MARKER PLACEMENT: SHX6858

## 2022-10-16 HISTORY — DX: Anemia, unspecified: D64.9

## 2022-10-16 HISTORY — PX: BRONCHIAL BIOPSY: SHX5109

## 2022-10-16 HISTORY — PX: BRONCHIAL WASHINGS: SHX5105

## 2022-10-16 HISTORY — PX: BRONCHIAL NEEDLE ASPIRATION BIOPSY: SHX5106

## 2022-10-16 HISTORY — DX: Fatty (change of) liver, not elsewhere classified: K76.0

## 2022-10-16 LAB — NOVEL CORONAVIRUS, NAA: SARS-CoV-2, NAA: NOT DETECTED

## 2022-10-16 LAB — GLUCOSE, CAPILLARY
Glucose-Capillary: 139 mg/dL — ABNORMAL HIGH (ref 70–99)
Glucose-Capillary: 129 mg/dL — ABNORMAL HIGH (ref 70–99)
Glucose-Capillary: 127 mg/dL — ABNORMAL HIGH (ref 70–99)

## 2022-10-16 LAB — SPECIMEN STATUS REPORT

## 2022-10-16 SURGERY — BRONCHOSCOPY, WITH BIOPSY USING ELECTROMAGNETIC NAVIGATION
Anesthesia: General | Laterality: Right

## 2022-10-16 MED ORDER — FENTANYL CITRATE (PF) 100 MCG/2ML IJ SOLN: 25.0000 ug | INTRAMUSCULAR | Status: DC | PRN

## 2022-10-16 MED ORDER — INSULIN ASPART 100 UNIT/ML IJ SOLN: 0.0000 [IU] | INTRAMUSCULAR | Status: DC | PRN

## 2022-10-16 MED ORDER — ROCURONIUM BROMIDE 10 MG/ML (PF) SYRINGE
PREFILLED_SYRINGE | INTRAVENOUS | Status: DC | PRN
  Administered 2022-10-16: 70 mg via INTRAVENOUS
  Administered 2022-10-16: 10 mg via INTRAVENOUS

## 2022-10-16 MED ORDER — LACTATED RINGERS IV SOLN: INTRAVENOUS | Status: DC

## 2022-10-16 MED ORDER — PROPOFOL 500 MG/50ML IV EMUL
INTRAVENOUS | Status: DC | PRN
  Administered 2022-10-16: 100 ug/kg/min via INTRAVENOUS

## 2022-10-16 MED ORDER — FENTANYL CITRATE (PF) 100 MCG/2ML IJ SOLN
INTRAMUSCULAR | Status: DC | PRN
  Administered 2022-10-16 (×2): 50 ug via INTRAVENOUS

## 2022-10-16 MED ORDER — PHENYLEPHRINE HCL-NACL 20-0.9 MG/250ML-% IV SOLN
INTRAVENOUS | Status: DC | PRN
  Administered 2022-10-16: 25 ug/min via INTRAVENOUS

## 2022-10-16 MED ORDER — ACETAMINOPHEN 325 MG PO TABS: 325.0000 mg | ORAL_TABLET | ORAL | Status: DC | PRN

## 2022-10-16 MED ORDER — DEXAMETHASONE SODIUM PHOSPHATE 10 MG/ML IJ SOLN
INTRAMUSCULAR | Status: DC | PRN
  Administered 2022-10-16: 10 mg via INTRAVENOUS

## 2022-10-16 MED ORDER — OXYCODONE HCL 5 MG/5ML PO SOLN: 5.0000 mg | Freq: Once | ORAL | Status: DC | PRN

## 2022-10-16 MED ORDER — CHLORHEXIDINE GLUCONATE 0.12 % MT SOLN
15.0000 mL | Freq: Once | OROMUCOSAL | Status: AC
  Administered 2022-10-16: 15 mL via OROMUCOSAL
  Filled 2022-10-16: qty 15

## 2022-10-16 MED ORDER — ONDANSETRON HCL 4 MG/2ML IJ SOLN: 4.0000 mg | Freq: Once | INTRAMUSCULAR | Status: DC | PRN

## 2022-10-16 MED ORDER — LIDOCAINE 2% (20 MG/ML) 5 ML SYRINGE
INTRAMUSCULAR | Status: DC | PRN
  Administered 2022-10-16: 100 mg via INTRAVENOUS

## 2022-10-16 MED ORDER — MEPERIDINE HCL 25 MG/ML IJ SOLN: 6.2500 mg | INTRAMUSCULAR | Status: DC | PRN

## 2022-10-16 MED ORDER — MIDAZOLAM HCL 2 MG/2ML IJ SOLN
INTRAMUSCULAR | Status: DC | PRN
  Administered 2022-10-16: 2 mg via INTRAVENOUS

## 2022-10-16 MED ORDER — PROPOFOL 10 MG/ML IV BOLUS
INTRAVENOUS | Status: DC | PRN
  Administered 2022-10-16: 150 mg via INTRAVENOUS
  Administered 2022-10-16: 50 mg via INTRAVENOUS

## 2022-10-16 MED ORDER — SUGAMMADEX SODIUM 200 MG/2ML IV SOLN
INTRAVENOUS | Status: DC | PRN
  Administered 2022-10-16: 200 mg via INTRAVENOUS

## 2022-10-16 MED ORDER — OXYCODONE HCL 5 MG PO TABS: 5.0000 mg | ORAL_TABLET | Freq: Once | ORAL | Status: DC | PRN

## 2022-10-16 MED ORDER — ONDANSETRON HCL 4 MG/2ML IJ SOLN
INTRAMUSCULAR | Status: DC | PRN
  Administered 2022-10-16: 4 mg via INTRAVENOUS

## 2022-10-16 MED ORDER — ACETAMINOPHEN 160 MG/5ML PO SOLN: 325.0000 mg | ORAL | Status: DC | PRN

## 2022-10-16 SURGICAL SUPPLY — 1 items: superlock fiducial marker IMPLANT

## 2022-10-16 NOTE — Telephone Encounter (Signed)
Would like to speak with doctor stat regarding patient's report.  Please call asap at [phone]

## 2022-10-16 NOTE — Anesthesia Procedure Notes (Signed)
Procedure Name: Intubation Date/Time: 10/16/2022 8:58 AM  Performed by: Harden Mo, CRNAPre-anesthesia Checklist: Patient identified, Emergency Drugs available, Suction available and Patient being monitored Patient Re-evaluated:Patient Re-evaluated prior to induction Oxygen Delivery Method: Circle System Utilized Preoxygenation: Pre-oxygenation with 100% oxygen Induction Type: IV induction Ventilation: Mask ventilation without difficulty and Oral airway inserted - appropriate to patient size Laryngoscope Size: Glidescope and 4 Grade View: Grade I Tube type: Oral Tube size: 8.5 mm Number of attempts: 1 Airway Equipment and Method: Stylet and Oral airway Placement Confirmation: ETT inserted through vocal cords under direct vision, positive ETCO2 and breath sounds checked- equal and bilateral Secured at: 23 cm Tube secured with: Tape Dental Injury: Teeth and Oropharynx as per pre-operative assessment

## 2022-10-16 NOTE — Interval H&P Note (Signed)
History and Physical Interval Note:  10/16/2022 7:31 AM  Randy Hebert  has presented today for surgery, with the diagnosis of lung nodule.  The various methods of treatment have been discussed with the patient and family. After consideration of risks, benefits and other options for treatment, the patient has consented to  Procedure(s): ROBOTIC ASSISTED NAVIGATIONAL BRONCHOSCOPY (Right) as a surgical intervention.  The patient's history has been reviewed, patient examined, no change in status, stable for surgery.  I have reviewed the patient's chart and labs.  Questions were answered to the patient's satisfaction.     Maryjane Hurter

## 2022-10-16 NOTE — Telephone Encounter (Signed)
Called and spoke with [facility] radiology. They stated that Dr.. Verlee Monte has already spoke to them and everything is taken care of.   Nothing further needed.

## 2022-10-16 NOTE — Anesthesia Postprocedure Evaluation (Signed)
Anesthesia Post Note  Patient: Randy Hebert  Procedure(s) Performed: ROBOTIC ASSISTED NAVIGATIONAL BRONCHOSCOPY (Right) BRONCHIAL NEEDLE ASPIRATION BIOPSIES BRONCHIAL BIOPSIES BRONCHIAL WASHINGS FIDUCIAL MARKER PLACEMENT     Anesthesia Type: General Anesthetic complications: no   No notable events documented.  Last Vitals:  Vitals:   10/16/22 1115 10/16/22 1215  BP: 121/81 (!) 141/83  Pulse: 67 67  Resp: 18 16  Temp:    SpO2: 100% 100%    Last Pain:  Vitals:   10/16/22 1215  TempSrc:   PainSc: 0-No pain                 Dowell Hoon

## 2022-10-16 NOTE — Transfer of Care (Signed)
Immediate Anesthesia Transfer of Care Note  Patient: Randy Hebert  Procedure(s) Performed: ROBOTIC ASSISTED NAVIGATIONAL BRONCHOSCOPY (Right) BRONCHIAL NEEDLE ASPIRATION BIOPSIES BRONCHIAL BIOPSIES BRONCHIAL WASHINGS FIDUCIAL MARKER PLACEMENT  Patient Location: PACU  Anesthesia Type:General  Level of Consciousness: awake, alert , and oriented  Airway & Oxygen Therapy: Patient Spontanous Breathing and Patient connected to face mask oxygen  Post-op Assessment: Report given to RN, Post -op Vital signs reviewed and stable, and Patient moving all extremities X 4  Post vital signs: Reviewed and stable  Last Vitals:  Vitals Value Taken Time  BP 120/76   Temp    Pulse 72   Resp 18   SpO2 91     Last Pain:  Vitals:   10/16/22 0721  TempSrc:   PainSc: 9          Complications: No notable events documented.

## 2022-10-16 NOTE — Op Note (Signed)
Video Bronchoscopy with Robotic Assisted Bronchoscopic Navigation   Date of Operation: 10/16/2022   Pre-op Diagnosis: RUL nodule  Post-op Diagnosis: same  Surgeon: Fosberg Shadow   Anesthesia: General endotracheal anesthesia  Operation: Flexible video fiberoptic bronchoscopy with robotic assistance and biopsies.  Estimated Blood Loss: Minimal  Complications: None  Indications and History: Randy Hebert is a 57 y.o. male with history of RUL nodule, . The risks, benefits, complications, treatment options and expected outcomes were discussed with the patient.  The possibilities of pneumothorax, pneumonia, reaction to medication, pulmonary aspiration, perforation of a viscus, bleeding, failure to diagnose a condition and creating a complication requiring transfusion or operation were discussed with the patient who freely signed the consent.    Description of Procedure: The patient was seen in the Preoperative Area, was examined and was deemed appropriate to proceed.  The patient was taken to [facility] endoscopy room 3, identified as Randy Hebert and the procedure verified as Flexible Video Fiberoptic Bronchoscopy.  A Time Out was held and the above information confirmed.   Prior to the date of the procedure a high-resolution CT scan of the chest was performed. Utilizing ION software program a virtual tracheobronchial tree was generated to allow the creation of distinct navigation pathways to the patient's parenchymal abnormalities. After being taken to the operating room general anesthesia was initiated and the patient  was orally intubated. The video fiberoptic bronchoscope was introduced via the endotracheal tube and a general inspection was performed which showed normal right and left lung anatomy, aspiration of the bilateral mainstems was completed to remove any remaining secretions. Robotic catheter inserted into patient's endotracheal tube.   Target #1 RUL nodule: The distinct navigation  pathways prepared prior to this procedure were then utilized to navigate to patient's lesion identified on CT scan. CIOS imaging was used to aid navigation and confirm ideal location for biopsy. The robotic catheter was secured into place and the vision probe was withdrawn.  Lesion location was approximated using fluoroscopy and radial endobronchial ultrasound for peripheral targeting. Under fluoroscopic guidance transbronchial needle brushings, transbronchial needle biopsies, and transbronchial forceps biopsies were performed to be sent for cytology and pathology. A fiducial marker was left at site of lesion. A bronchioalveolar lavage was performed in the RUL and sent for cytology.   At the end of the procedure a general airway inspection was performed and there was no evidence of active bleeding. The bronchoscope was removed.  The patient tolerated the procedure well. There was no significant blood loss and there were no obvious complications. A post-procedural chest x-ray is pending.  Samples Target #1: 1. Transbronchial Wang needle biopsies from RUL nodule 2. Transbronchial forceps biopsies from RUL nodule 3. Bronchoalveolar lavage from RUL nodule 4. Endobronchial biopsies from RUL nodule  Plans:  The patient will be discharged from the PACU to home when recovered from anesthesia and after chest x-ray is reviewed. We will review the cytology, pathology and microbiology results with the patient when they become available. Outpatient followup will be with myself 10/27/22.

## 2022-10-16 NOTE — Telephone Encounter (Signed)
I have scheduled pt for 10/17/22 at 10:30 am  Called and left detailed msg on machine and cxr already ordered by Dr Verlee Monte

## 2022-10-16 NOTE — Discharge Instructions (Signed)
-   Normal to have cough with small blood clots, bloody streaks for first 1-3 days after bronchoscopy. If coughing up clot size of your thumb or larger then call our office [phone] and if persistent, especially if you're also developing worsening shortness of breath then would plan to head to ED. - OK to resume aspirin day after tomorrow as long as bloody cough subsiding - Fever is common in the first 1-2 days after bronchoscopy and often is simply your body's reaction to saline exposure in your lung - just take tylenol. - Will call you in 3-5 business days when results become available

## 2022-10-16 NOTE — Telephone Encounter (Signed)
Can we schedule an appointment for him tomorrow? Needs chest x ray for pneumothorax which I've ordered. I told him he could come by any time for the x ray and I could see him whenever he shows up quickly.  Thanks!!

## 2022-10-16 NOTE — Progress Notes (Signed)
Synopsis: Referred for pulmonary nodule by Coral Spikes, DO  Subjective:   PATIENT ID: Randy Hebert GENDER: male DOB: [birth year], MRN: [id number]  Chief Complaint  Patient presents with   Follow-up    SOB since bronchoscopy yesterday.   1yM with history of COPD, CAD, OSA, alcoholic/nonalcoholic cirrhosis, ?difficult intubation due to vocal cord injury referred for pet avid pulmonary nodule RUL  No recent courses of prednisone for AECOPD.   On level ground he is not limited by DOE. Can climb 1-2 flights of stairs before needs to stop for DOE.   Using symbicort 2 puffs twice daily, rinsing mouth after use.   No clear cut family history of lung cancer. His aunt needed lung transplant but unsure why  He is disabled, worked in past in Science writer. Still smoking 1 ppd. Off alcohol since January 2023. Off cocaine since 8 ya.   Interval HPI  S/p nav bronch 2/15 c/b small apical ptx. Not clearly symptomatic yesterday but wore CPAP overnight, woke up with greater R pleuritic pain, worse also after smoking  Otherwise pertinent review of systems is negative.   Past Medical History:  Diagnosis Date   ACE inhibitor-aggravated angioedema    Allergy    Anemia    Angio-edema    Anxiety    Arthritis    Asthma    hip replacement   Back pain    Bradycardia 04/28/2017   Bulging of cervical intervertebral disc    CAD (coronary artery disease)    lateral STEMI 02/06/2015 00% D1 occlusion treated with Promus Premier 2.5 mm x 16 mm DES, 70% ramus stenosis, 40% mid RCA stenosis, 45% distal RCA stenosis, EF 45-50%   CHF (congestive heart failure) (HCC)    COPD (chronic obstructive pulmonary disease) ([place])    Depression    Diabetes mellitus without complication ([place])    Difficult intubation    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   Dry eye    Dyspnea    Early satiety 09/23/2016   Fatty liver    GERD (gastroesophageal reflux disease)    HCAP  (healthcare-associated pneumonia) 05/15/2017   Headache    Heart murmur    Hip pain    Hyperlipidemia    Hypertension    Lobar pneumonia ([place]) 05/15/2017   Melena 08/04/2018   MI (myocardial infarction) [name])    Myocardial infarction ([place])    Neck pain    Non-ST elevation (NSTEMI) myocardial infarction ([place]) 04/27/2017   NSTEMI (non-ST elevated myocardial infarction) ([place]) 04/26/2017   Otitis media    Pleurisy    Pneumonia due to COVID-19 virus 10/07/2019   Rectal bleeding 11/08/2018   Right shoulder pain 03/20/2020   Sinus pause    9 sec sinus pause on telemetry after started on coreg after MI, avoid AV nodal blocking agent   Sleep apnea    uses a cpap   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   Arthritis Mother  Heart disease Brother 75       died in sleep   Early death Brother    Diabetes Maternal Uncle    Alzheimer's disease Maternal Grandmother      Past Surgical History:  Procedure Laterality Date   BIOPSY  10/09/2016   Procedure: BIOPSY;  Surgeon: Daneil Dolin, MD;  [facility];  Service: Endoscopy;;   BIOPSY  11/28/2019   Procedure: BIOPSY;  Surgeon: Daneil Dolin, MD;  [facility];  Service: Endoscopy;;   CARDIAC CATHETERIZATION N/A 02/06/2015   Procedure: Left Heart Cath and Coronary Angiography;  Surgeon: Leonie Man, MD;  Location: [place] [facility] LAB;  Service: Cardiovascular;  Laterality: N/A;   CARDIAC CATHETERIZATION N/A 02/06/2015   Procedure: Coronary Stent Intervention;  Surgeon: Leonie Man, MD;   Location: [place] [facility];  Service: Cardiovascular;  Laterality: N/A;   COLONOSCOPY WITH PROPOFOL N/A 10/09/2016   Sigmoid and descending colon diverticulosis, four 4-6 mm polyps in sigmoid, one 4 mm polyp in descending. Tubular adenomas and hyperplastic. 5 year surveillance.    COLONOSCOPY WITH PROPOFOL N/A 11/24/2016   Sigmoid and descending colon diverticulosis, four 4-6 mm polyps in sigmoid, one 4 mm polyp in descending. Tubular adenomas and hyperplastic. 5 year surveillance.    COLONOSCOPY WITH PROPOFOL N/A 10/18/2021   [place]: nonbleeding internal hemorrhoids, small and large mouth diverticula found in the sigmoid, descending, transverse colon.  A 9 mm sessile polyp was found in the ascending colon that was removed.  4 sessile polyps found in the sigmoid, descending, transverse colon 3 to 5 mm in size, examining otherwise.  Path revealed tubular adenomas.  Repeat due in 5 years for surveillance.   CORONARY ANGIOPLASTY WITH STENT PLACEMENT  01/2015   CORONARY STENT INTERVENTION N/A 04/27/2017   Procedure: CORONARY STENT INTERVENTION;  Surgeon: Nelva Bush, MD;  Location: [place] [facility];  Service: Cardiovascular;  Laterality: N/A;   ELECTROPHYSIOLOGY STUDY N/A 06/29/2017   Procedure: ELECTROPHYSIOLOGY STUDY;  Surgeon: Evans Lance, MD;  Location: [place] [facility];  Service: Cardiovascular;  Laterality: N/A;   ESOPHAGOGASTRODUODENOSCOPY (EGD) WITH PROPOFOL N/A 10/09/2016   Dr. Gala Romney: LA grade a esophagitis.  Barrett's esophagus, biopsy-proven.  Small hiatal hernia.  EGD February 2019   ESOPHAGOGASTRODUODENOSCOPY (EGD) WITH PROPOFOL N/A 11/28/2019    salmon-colored esophageal mucosa (Barrett's) small hiatal hernia, portal hypertensive gastropathy, normal duodenum, 3 year surveillance   ESOPHAGOGASTRODUODENOSCOPY (EGD) WITH PROPOFOL N/A 09/06/2021   three columns of grade 1 varices in distal esophagus, no stigmata of bleeding or red wale signs. Small hiatal hernia. Mild portal  gastropathy. Normal duodenum. Repeat upper endoscopy in 1 year for surveillance.   INCISION / DRAINAGE HAND / FINGER     LEFT HEART CATH AND CORONARY ANGIOGRAPHY N/A 04/27/2017   Procedure: LEFT HEART CATH AND CORONARY ANGIOGRAPHY;  Surgeon: Nelva Bush, MD;  Location: [place] [facility];  Service: Cardiovascular;  Laterality: N/A;   LOOP RECORDER INSERTION  06/29/2017   Procedure: Loop Recorder Insertion;  Surgeon: Evans Lance, MD;  Location: [place] [facility];  Service: Cardiovascular;;   POLYPECTOMY  11/24/2016   Procedure: POLYPECTOMY;  Surgeon: Daneil Dolin, MD;  [facility];  Service: Endoscopy;;  descending and sigmoid   POLYPECTOMY  10/18/2021   Procedure: POLYPECTOMY;  Surgeon: Eloise Harman, DO;  [facility];  Service: Endoscopy;;   TOTAL HIP ARTHROPLASTY Right 07/01/2016   TOTAL HIP ARTHROPLASTY Right 07/01/2016   Procedure:  RIGHT TOTAL HIP ARTHROPLASTY ANTERIOR APPROACH;  Surgeon: Mcarthur Rossetti, MD;  Location: [place];  Service: Orthopedics;  Laterality: Right;    Social History   Socioeconomic History   Marital status: Divorced    Spouse name: alice   Number of children: 3   Years of education: 12   Highest education level: Not on file  Occupational History   Occupation: SSI  Tobacco Use   Smoking status: Every Day    Packs/day: 1.00    Years: 46.00    Total pack years: 46.00    Types: Cigarettes    Passive exposure: Never   Smokeless tobacco: Former    Types: Chew   Tobacco comments:    Smokes 1 pack a day of cigarettes   Vaping Use   Vaping Use: Former  Substance and Sexual Activity   Alcohol use: Yes    Comment: rare beer use   Drug use: No    Comment: history of drug use- marijuana, cocaine- quit 2014   Sexual activity: Yes    Partners: Female    Birth control/protection: None  Other Topics Concern   Not on file  Social History Narrative   Lives with girlfriend [name]   3 children, but 1 OD  In 2019.    Grandchildren-2      Two cats: may and princess; and a dog-foxy      Enjoy: spending time with pets, TV, yard work       Diet: eats all food groups   Caffeine: half and half coffee, some tea-half and half decaf   Water: 6-8 cups daily      Wears seat belt   Does not use phone while driving   Smoke detectors at home      Social Determinants of Health   Financial Resource Strain: Low Risk  (10/28/2021)   Overall Financial Resource Strain (CARDIA)    Difficulty of Paying Living Expenses: Not hard at all  Food Insecurity: No Food Insecurity (01/23/2022)   Hunger Vital Sign    Worried About Running Out of Food in the Last Year: Never true    [place] in the Last Year: Never true  Transportation Needs: No Transportation Needs (01/23/2022)   PRAPARE - Hydrologist (Medical): No    Lack of Transportation (Non-Medical): No  Physical Activity: Inactive (10/28/2021)   Exercise Vital Sign    Days of Exercise per Week: 0 days    Minutes of Exercise per Session: 0 min  Stress: No Stress Concern Present (10/28/2021)   [place]    Feeling of Stress : Not at all  Social Connections: Moderately Integrated (10/28/2021)   Social Connection and Isolation Panel [NHANES]    Frequency of Communication with Friends and Family: More than three times a week    Frequency of Social Gatherings with Friends and Family: Twice a week    Attends Religious Services: More than 4 times per year    Active Member of [employer] or Organizations: Yes    Attends Archivist Meetings: More than 4 times per year    Marital Status: Divorced  Intimate Partner Violence: Not At Risk (10/28/2021)   Humiliation, Afraid, Rape, and Kick questionnaire    Fear of Current or Ex-Partner: No    Emotionally Abused: No    Physically Abused: No    Sexually Abused: No     Allergies  Allergen Reactions  Carvedilol Other (See  Comments)    Sinus pause on telemetry >3 seconds. Longest one 9 sec. No AV nodal agent   Lisinopril Anaphylaxis, Shortness Of Breath and Swelling    Angioedema, required intubation and mechanical ventilation   Chantix [Varenicline] Other (See Comments)    Nightmares and unable to sleep well   Amoxicillin Nausea Only    Did it involve swelling of the face/tongue/throat, SOB, or low BP? No Did it involve sudden or severe rash/hives, skin peeling, or any reaction on the inside of your mouth or nose? No Did you need to seek medical attention at a hospital or doctor's office? No When did it last happen? childhood reaction      If all above answers are "NO", may proceed with cephalosporin use.      Outpatient Medications Prior to Visit  Medication Sig Dispense Refill   acetaminophen (TYLENOL) 500 MG tablet Take 2 tablets (1,000 mg total) by mouth every 8 (eight) hours as needed for mild pain or fever.     albuterol (PROVENTIL) (2.5 MG/3ML) 0.083% nebulizer solution Take 3 mLs (2.5 mg total) by nebulization every 6 (six) hours as needed for wheezing or shortness of breath. 150 mL 1   albuterol (VENTOLIN HFA) 108 (90 Base) MCG/ACT inhaler INHALE 2 PUFFS INTO THE LUNGS EVERY 6 HOURS AS NEEDEDFOR SHORTNESS OF BREATH OR WHEEZING. 18 g 1   amLODipine (NORVASC) 10 MG tablet TAKE 1 TABLET BY MOUTH ONCE A DAY. 90 tablet 1   ascorbic acid (VITAMIN C) 500 MG tablet Take 1 tablet (500 mg total) by mouth daily. 30 tablet 1   aspirin 81 MG EC tablet Take 1 tablet (81 mg total) by mouth daily. (Patient taking differently: Take 81 mg by mouth in the morning and at bedtime.) 30 tablet    atorvastatin (LIPITOR) 80 MG tablet Take 1 tablet by mouth once daily 90 tablet 0   azelastine (ASTELIN) 0.1 % nasal spray USE 2 SPRAYS IN EACH NOSTRIL TWICE DAILY. 30 mL 11   Blood Glucose Monitoring Suppl (ACCU-CHEK GUIDE ME) w/Device KIT 1 Piece by Does not apply route as directed. 1 kit 0   budesonide-formoterol (SYMBICORT)  160-4.5 MCG/ACT inhaler Take 2 puffs first thing in am and then another 2 puffs about 12 hours later. 1 each 12   Cholecalciferol (VITAMIN D3) 125 MCG (5000 UT) CAPS Take 1 capsule (5,000 Units total) by mouth daily. 90 capsule 0   citalopram (CELEXA) 20 MG tablet Take 1 tablet (20 mg total) by mouth daily. 90 tablet 3   cycloSPORINE (RESTASIS) 0.05 % ophthalmic emulsion Place 1 drop into both eyes 2 (two) times daily.     dapagliflozin propanediol (FARXIGA) 10 MG TABS tablet Take 10 mg by mouth daily.     Dulaglutide (TRULICITY) 1.5 PZ/0.2HE SOPN INJECT 1.5 MG INTO THE SKIN ONCE WEEKLY. 6 mL 0   ferrous sulfate 325 (65 FE) MG tablet Take 1 tablet (325 mg total) by mouth daily with breakfast. 90 tablet 1   furosemide (LASIX) 40 MG tablet Take 1 tablet (40 mg total) by mouth daily as needed for fluid. 30 tablet 11   glipiZIDE (GLUCOTROL XL) 5 MG 24 hr tablet TAKE 1 TABLET BY MOUTH DAILY WITH BREAKFAST. 90 tablet 0   glucose blood (ACCU-CHEK GUIDE) test strip Use to test glucose 2 times daily. 100 each 2   lactulose (CHRONULAC) 10 GM/15ML solution TAKE 15 MLS BY MOUTH ONCE DAILY. 473 mL 0   magnesium gluconate (MAGONATE) 500  MG tablet Take 500 mg by mouth 2 (two) times daily.     metFORMIN (GLUCOPHAGE) 500 MG tablet TAKE 1 TABLET BY MOUTH TWICE DAILY WITH MEALS. 60 tablet 3   mometasone (ELOCON) 0.1 % cream Apply 1 application topically daily as needed (eczema on ears).     olopatadine (PATANOL) 0.1 % ophthalmic solution Place 1 drop into both eyes 2 (two) times daily.     pantoprazole (PROTONIX) 40 MG tablet TAKE (1) TABLET BY MOUTH TWICE DAILY BEFORE A MEAL. 180 tablet 3   varenicline (CHANTIX CONTINUING MONTH PAK) 1 MG tablet Take 1 tablet (1 mg total) by mouth daily. (Patient not taking: Reported on 10/17/2022) 30 tablet 1   varenicline (CHANTIX) 0.5 MG tablet Take 1 tablet (0.5 mg total) by mouth daily. (Patient not taking: Reported on 10/17/2022) 60 tablet 0   FARXIGA 10 MG TABS tablet TAKE ONE  TABLET BY MOUTH ONCE DAILY. 30 tablet 11   No facility-administered medications prior to visit.       Objective:   Physical Exam:  General appearance: 57 y.o., male, NAD, conversant  Eyes: anicteric sclerae; PERRL, tracking appropriately HENT: NCAT; MMM Neck: Trachea midline; no lymphadenopathy, no JVD Lungs: diminished on right, with normal respiratory effort CV: RRR, no murmur  Abdomen: Soft, non-tender; non-distended, BS present  Extremities: No peripheral edema, warm Skin: Normal turgor and texture; no rash Psych: Appropriate affect Neuro: Alert and oriented to person and place, no focal deficit     Vitals:   10/17/22 1049  BP: (!) 140/70  Pulse: 94  Temp: 97.9 F (36.6 C)  TempSrc: Oral  SpO2: 96%  Weight: 202 lb 12.8 oz (92 kg)  Height: 5\' 5"  (1.651 m)    96% on RA BMI Readings from Last 3 Encounters:  10/17/22 33.75 kg/m  10/16/22 33.28 kg/m  09/25/22 33.81 kg/m   Wt Readings from Last 3 Encounters:  10/17/22 202 lb 12.8 oz (92 kg)  10/16/22 200 lb (90.7 kg)  09/25/22 203 lb 3.2 oz (92.2 kg)     CBC    Component Value Date/Time   WBC 8.3 09/29/2022 0836   RBC 5.41 09/29/2022 0836   HGB 16.5 09/29/2022 0836   HGB 16.1 02/17/2022 0811   HCT 46.9 09/29/2022 0836   HCT 48.4 02/17/2022 0811   PLT 180 09/29/2022 0836   PLT 197 02/17/2022 0811   MCV 86.7 09/29/2022 0836   MCV 83 02/17/2022 0811   MCH 30.5 09/29/2022 0836   MCHC 35.2 09/29/2022 0836   RDW 14.0 09/29/2022 0836   RDW 17.1 (H) 02/17/2022 0811   LYMPHSABS 1,635 09/29/2022 0836   LYMPHSABS 1.8 09/13/2021 0955   MONOABS 0.5 09/05/2021 0415   EOSABS 232 09/29/2022 0836   EOSABS 0.2 09/13/2021 0955   BASOSABS 50 09/29/2022 0836   BASOSABS 0.0 09/13/2021 0955      Chest Imaging: 07/18/22 PET/CT reviewed by me with weakly avid RUL 1cm nodule  Pulmonary Functions Testing Results:    Latest Ref Rng & Units 08/20/2022   10:50 AM 07/13/2015    8:44 AM  PFT Results  FVC-Pre L  2.92  2.74   FVC-Predicted Pre % 71  64   FVC-Post L 2.70  2.64   FVC-Predicted Post % 66  62   Pre FEV1/FVC % % 75  79   Post FEV1/FCV % % 81  83   FEV1-Pre L 2.19  2.16   FEV1-Predicted Pre % 70  65   FEV1-Post L 2.18  2.20   DLCO uncorrected ml/min/mmHg 15.45  15.58   DLCO UNC% % 64  60   DLVA Predicted % 84  79   TLC L 6.33  4.55   TLC % Predicted % 105  76   RV % Predicted % 185  97      Echocardiogram 08/05/22:   1. Left ventricular ejection fraction, by estimation, is 50 to 55%. The  left ventricle has low normal function. The left ventricle has no regional  wall motion abnormalities. Left ventricular diastolic parameters are  indeterminate.   2. Right ventricular systolic function is normal. The right ventricular  size is normal.   3. The mitral valve is normal in structure. No evidence of mitral valve  regurgitation. No evidence of mitral stenosis.   4. The aortic valve is tricuspid. There is mild calcification of the  aortic valve. There is mild thickening of the aortic valve. Aortic valve  regurgitation is not visualized. No aortic stenosis is present.   5. The inferior vena cava is normal in size with greater than 50%  respiratory variability, suggesting right atrial pressure of 3 mmHg.      Assessment & Plan:   # Iatrogenic right pneumothorax Symptomatic with R pleuritic pain and mildly increased DOE, slightly larger after use of CPAP overnight, smoking.  # RUL weakly avid nodule s/p nav bronch with fiducial marker placement Highly suspicious for stage I nsclc  # Air trapping, mildly reduced diffusing capacity # At risk for COPD, possible asthma  # Smoking  Plan: - Recommended admission for chest tube placement for symptomatic R ptx given his underlying lung disease, he declines. Discussed option of needle aspiration in clinic which could improve symptoms and decrease likelihood of recurrence by maybe 50% though 10% chance I make things worse, he declines.   - ED precautions reinforced  - [facility]/22 at [facility] - stay off CPAP, cigarettes at least until we have discussed next chest x ray - continue symbicort  - follow up tbd pending next x ray   Maryjane Hurter, MD [facility] 10/17/2022 11:15 AM

## 2022-10-17 ENCOUNTER — Ambulatory Visit (INDEPENDENT_AMBULATORY_CARE_PROVIDER_SITE_OTHER): Payer: 59 | Admitting: Student

## 2022-10-17 ENCOUNTER — Ambulatory Visit (INDEPENDENT_AMBULATORY_CARE_PROVIDER_SITE_OTHER): Payer: 59

## 2022-10-17 ENCOUNTER — Encounter: Payer: Self-pay | Admitting: Student

## 2022-10-17 ENCOUNTER — Ambulatory Visit: Payer: 59 | Admitting: Student

## 2022-10-17 VITALS — BP 140/70 | HR 94 | Temp 97.9°F | Ht 65.0 in | Wt 202.8 lb

## 2022-10-17 DIAGNOSIS — J939 Pneumothorax, unspecified: Secondary | ICD-10-CM

## 2022-10-17 DIAGNOSIS — J90 Pleural effusion, not elsewhere classified: Secondary | ICD-10-CM | POA: Diagnosis not present

## 2022-10-17 DIAGNOSIS — J9811 Atelectasis: Secondary | ICD-10-CM | POA: Diagnosis not present

## 2022-10-17 NOTE — Patient Instructions (Signed)
-   x ray 10/21/22 at [place] would stop smoking while you have pneumothorax - Would not do CPAP unless otherwise instructed from [place]  - symbicort 2 puffs twice daily, rinse mouth after use - albuterol as needed

## 2022-10-19 ENCOUNTER — Encounter (HOSPITAL_COMMUNITY): Payer: Self-pay | Admitting: Student

## 2022-10-20 ENCOUNTER — Ambulatory Visit (HOSPITAL_COMMUNITY)
Admission: RE | Admit: 2022-10-20 | Discharge: 2022-10-20 | Disposition: A | Discharge status: Home / Self Care | Payer: 59 | Source: Ambulatory Visit | Attending: Student | Admitting: Student

## 2022-10-20 ENCOUNTER — Telehealth: Payer: Self-pay | Admitting: Student

## 2022-10-20 ENCOUNTER — Ambulatory Visit (HOSPITAL_COMMUNITY)
Admission: RE | Admit: 2022-10-20 | Discharge: 2022-10-20 | Disposition: A | Discharge status: Home / Self Care | Payer: 59 | Source: Ambulatory Visit | Attending: Gastroenterology | Admitting: Gastroenterology

## 2022-10-20 DIAGNOSIS — R911 Solitary pulmonary nodule: Secondary | ICD-10-CM | POA: Diagnosis not present

## 2022-10-20 DIAGNOSIS — K7460 Unspecified cirrhosis of liver: Secondary | ICD-10-CM | POA: Insufficient documentation

## 2022-10-20 DIAGNOSIS — J939 Pneumothorax, unspecified: Secondary | ICD-10-CM

## 2022-10-20 DIAGNOSIS — K824 Cholesterolosis of gallbladder: Secondary | ICD-10-CM | POA: Diagnosis not present

## 2022-10-20 LAB — CYTOLOGY - NON PAP

## 2022-10-20 NOTE — Telephone Encounter (Signed)
Routing message to Dr. Verlee Monte.

## 2022-10-21 ENCOUNTER — Telehealth: Payer: Self-pay | Admitting: Student

## 2022-10-21 DIAGNOSIS — J939 Pneumothorax, unspecified: Secondary | ICD-10-CM

## 2022-10-21 DIAGNOSIS — C3491 Malignant neoplasm of unspecified part of right bronchus or lung: Secondary | ICD-10-CM

## 2022-10-21 LAB — CYTOLOGY - NON PAP

## 2022-10-21 NOTE — Telephone Encounter (Signed)
Spoke with [facility]- call report on cxr dated 10/17/22  Routing to Dr Verlee Monte  IMPRESSION: 1. Small right pneumothorax is again noted. Minimal increase in size from prior exam cannot be excluded. 2. Right apical nodule again noted. An adjacent small clip noted. 3. Low lung volumes with mild bibasilar atelectasis. Mild infiltrate right lung base cannot be excluded. Tiny right pleural effusion noted. 4. Right-sided aortic arch again noted. Cardiomegaly. No pulmonary venous congestion.   These results will be called to the ordering clinician or representative by the Radiologist Assistant, and communication documented in the PACS or [employer].     Electronically Signed   By: Marcello Moores  Register M.D.   On: 10/21/2022 09:17

## 2022-10-21 NOTE — Telephone Encounter (Signed)
Spoke with the pt and notified of results and recommendations He verbalized understanding  He is aware that oncology will reach out to schedule appt  He will mark his calendar for 2 wks and go to [facility] for cxr to be repeated  Pt denied having any questions at this time   Will seek emergent care should he start having sharp CP, SOB

## 2022-10-21 NOTE — Telephone Encounter (Signed)
Can you relay the following?  Called [facility] listed mobile number last night and this afternoon to review CXR results from 2/19 which do show improvement in pneumothorax size, now down to 1.2cm apically I also wanted to review his bronchoscopy results. I was only able to leave voicemail. If he wishes to know bronchoscopy results, they do show non-small cell lung cancer in his RUL nodule, this would be stage I lung cancer. I have referred him to radiation oncology. I have also ordered another chest x-ray in 2 weeks at [facility]. Would encourage him to stay off of CPAP and cigarettes/MJ at least until his follow up chest x ray for his pneumothorax. If sudden sharp chest pain, shortness of breath, needs to head to ED.

## 2022-10-22 ENCOUNTER — Telehealth: Payer: Self-pay | Admitting: Radiation Oncology

## 2022-10-22 NOTE — Progress Notes (Signed)
Thoracic Location of Tumor / Histology: Lung Nodule  Patient presented {numbers [id number]:[id number]} months ago with symptoms of: ***  Bronch 10/16/2022:   CT Super D Chest 10/13/2022: Irregular solid 1.1 cm anterior apical right upper lobe pulmonary nodule, slightly increased in size since 06/25/2022 CT, remaining suspicious for primary bronchogenic carcinoma.  No thoracic adenopathy.  PET 07/17/2022: There is mild increased FDG uptake associated with the apical segment of right upper lobe pulmonary nodule. Suspicious for small primary bronchogenic carcinoma.  No signs of tracer avid nodal metastasis or distant metastatic disease.   Biopsies of RUL Lung 10/16/2022      Tobacco/Marijuana/Snuff/ETOH use: Current Smoker  Past/Anticipated interventions by cardiothoracic surgery, if any:   Past/Anticipated interventions by medical oncology, if any:     Signs/Symptoms Weight changes, if any:  Respiratory complaints, if any:  Hemoptysis, if any:  Pain issues, if any:    SAFETY ISSUES: Prior radiation?  Pacemaker/ICD?   Possible current pregnancy? N/a Is the patient on methotrexate?   Current Complaints / other details:

## 2022-10-22 NOTE — Telephone Encounter (Signed)
2/21 @ 8:18 AM Left voicemail for patient to call our office to be schedule for an consult.

## 2022-10-23 ENCOUNTER — Ambulatory Visit
Admission: RE | Admit: 2022-10-23 | Discharge: 2022-10-23 | Disposition: A | Discharge status: Home / Self Care | Payer: 59 | Source: Ambulatory Visit | Attending: Radiation Oncology | Admitting: Radiation Oncology

## 2022-10-23 ENCOUNTER — Encounter: Payer: Self-pay | Admitting: Radiation Oncology

## 2022-10-23 ENCOUNTER — Other Ambulatory Visit: Payer: Self-pay

## 2022-10-23 VITALS — BP 148/71 | HR 72 | Temp 97.2°F | Resp 18 | Ht 65.0 in | Wt 202.4 lb

## 2022-10-23 DIAGNOSIS — C3411 Malignant neoplasm of upper lobe, right bronchus or lung: Secondary | ICD-10-CM | POA: Insufficient documentation

## 2022-10-23 DIAGNOSIS — M1990 Unspecified osteoarthritis, unspecified site: Secondary | ICD-10-CM | POA: Insufficient documentation

## 2022-10-23 DIAGNOSIS — Z8616 Personal history of COVID-19: Secondary | ICD-10-CM | POA: Insufficient documentation

## 2022-10-23 DIAGNOSIS — Z7984 Long term (current) use of oral hypoglycemic drugs: Secondary | ICD-10-CM | POA: Insufficient documentation

## 2022-10-23 DIAGNOSIS — K219 Gastro-esophageal reflux disease without esophagitis: Secondary | ICD-10-CM | POA: Insufficient documentation

## 2022-10-23 DIAGNOSIS — I10 Essential (primary) hypertension: Secondary | ICD-10-CM | POA: Insufficient documentation

## 2022-10-23 DIAGNOSIS — E785 Hyperlipidemia, unspecified: Secondary | ICD-10-CM | POA: Diagnosis not present

## 2022-10-23 DIAGNOSIS — Z79899 Other long term (current) drug therapy: Secondary | ICD-10-CM | POA: Diagnosis not present

## 2022-10-23 DIAGNOSIS — Z7982 Long term (current) use of aspirin: Secondary | ICD-10-CM | POA: Diagnosis not present

## 2022-10-23 DIAGNOSIS — R011 Cardiac murmur, unspecified: Secondary | ICD-10-CM | POA: Diagnosis not present

## 2022-10-23 DIAGNOSIS — K7460 Unspecified cirrhosis of liver: Secondary | ICD-10-CM | POA: Insufficient documentation

## 2022-10-23 DIAGNOSIS — F17210 Nicotine dependence, cigarettes, uncomplicated: Secondary | ICD-10-CM | POA: Insufficient documentation

## 2022-10-23 DIAGNOSIS — I2510 Atherosclerotic heart disease of native coronary artery without angina pectoris: Secondary | ICD-10-CM | POA: Insufficient documentation

## 2022-10-23 DIAGNOSIS — M47814 Spondylosis without myelopathy or radiculopathy, thoracic region: Secondary | ICD-10-CM | POA: Insufficient documentation

## 2022-10-23 DIAGNOSIS — J449 Chronic obstructive pulmonary disease, unspecified: Secondary | ICD-10-CM | POA: Diagnosis not present

## 2022-10-23 DIAGNOSIS — I509 Heart failure, unspecified: Secondary | ICD-10-CM | POA: Insufficient documentation

## 2022-10-23 DIAGNOSIS — E119 Type 2 diabetes mellitus without complications: Secondary | ICD-10-CM | POA: Insufficient documentation

## 2022-10-23 DIAGNOSIS — I252 Old myocardial infarction: Secondary | ICD-10-CM | POA: Diagnosis not present

## 2022-10-23 DIAGNOSIS — Z7951 Long term (current) use of inhaled steroids: Secondary | ICD-10-CM | POA: Diagnosis not present

## 2022-10-23 NOTE — Progress Notes (Signed)
Radiation Oncology         [phone] ________________________________  Name: Randy Hebert        MRN: [id number]  Date of Service: 10/23/2022 DOB: 12-04-1965  WU:JWJX, Barnie Del, DO  Meier, Hortencia Conradi, MD     REFERRING PHYSICIAN: Maryjane Hurter, MD   DIAGNOSIS: The encounter diagnosis was Malignant neoplasm of upper lobe of right lung [facility]).   HISTORY OF PRESENT ILLNESS: Randy Hebert is a 57 y.o. male seen at the request of Dr. Verlee Monte for a new diagnosis of lung cancer. The patient was referred to pulmonary medicine in the fall 2023 for further management of his COPD.  He was counseled on the importance of lung cancer screening scan which was performed on 06/25/2022 and showed a solid pulmonary nodule in the right upper lobe measuring 10.3 mm.  A PET scan on 07/17/2022 showed increased active activity in the apical segment of the right upper lobe and the visible nodule concerning for primary bronchogenic carcinoma.  No evidence of nodal or distant metastases were appreciated.  Bronchoscopy was performed on 10/16/2022 and fine-needle aspirate showed non-small cell lung cancer, consistent with squamous cell carcinoma additional lavage showed atypical cells.  He did have a post biopsy pneumothorax which by chest x-ray on 10/20/22 has improved, and he will be having a repeat in another couple of weeks.  He met with Dr. Roxan Hockey, and while he could consider surgery with cardiology clearance, the patient is not interested in surgery. Rather, he is seen to discuss stereotactic body radiotherapy (SBRT).    PREVIOUS RADIATION THERAPY: No   PAST MEDICAL HISTORY:  Past Medical History:  Diagnosis Date   ACE inhibitor-aggravated angioedema    Allergy    Anemia    Angio-edema    Anxiety    Arthritis    Asthma    hip replacement   Back pain    Bradycardia 04/28/2017   Bulging of cervical intervertebral disc    CAD (coronary artery disease)    lateral STEMI 02/06/2015 00% D1  occlusion treated with Promus Premier 2.5 mm x 16 mm DES, 70% ramus stenosis, 40% mid RCA stenosis, 45% distal RCA stenosis, EF 45-50%   CHF (congestive heart failure) (HCC)    COPD (chronic obstructive pulmonary disease) ([place])    Depression    Diabetes mellitus without complication ([place])    Difficult intubation    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   Dry eye    Dyspnea    Early satiety 09/23/2016   Fatty liver    GERD (gastroesophageal reflux disease)    HCAP (healthcare-associated pneumonia) 05/15/2017   Headache    Heart murmur    Hip pain    Hyperlipidemia    Hypertension    Lobar pneumonia ([place]) 05/15/2017   Melena 08/04/2018   MI (myocardial infarction) [facility])    Myocardial infarction ([place])    Neck pain    Non-ST elevation (NSTEMI) myocardial infarction ([place]) 04/27/2017   NSTEMI (non-ST elevated myocardial infarction) ([place]) 04/26/2017   Otitis media    Pleurisy    Pneumonia due to COVID-19 virus 10/07/2019   Rectal bleeding 11/08/2018   Right shoulder pain 03/20/2020   Sinus pause    9 sec sinus pause on telemetry after started on coreg after MI, avoid AV nodal blocking agent   Sleep apnea    uses a cpap   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   STEMI (ST elevation myocardial infarction) (  [place]) 05/31/2015   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   Syncope 14/97/0263   Syncope and collapse 05/15/2017   Transaminitis 08/04/2018   Unilateral primary osteoarthritis, right hip 07/01/2016       PAST SURGICAL HISTORY: Past Surgical History:  Procedure Laterality Date   BIOPSY  10/09/2016   Procedure: BIOPSY;  Surgeon: Daneil Dolin, MD;  [facility];  Service: Endoscopy;;   BIOPSY  11/28/2019   Procedure: BIOPSY;  Surgeon: Daneil Dolin, MD;  [facility];  Service: Endoscopy;;   BRONCHIAL BIOPSY  10/16/2022   Procedure: BRONCHIAL BIOPSIES;  Surgeon: Maryjane Hurter, MD;  Location: [place];  Service:  Pulmonary;;   BRONCHIAL NEEDLE ASPIRATION BIOPSY  10/16/2022   Procedure: BRONCHIAL NEEDLE ASPIRATION BIOPSIES;  Surgeon: Maryjane Hurter, MD;  Location: [place];  Service: Pulmonary;;   BRONCHIAL WASHINGS  10/16/2022   Procedure: BRONCHIAL WASHINGS;  Surgeon: Maryjane Hurter, MD;  Location: [facility];  Service: Pulmonary;;   CARDIAC CATHETERIZATION N/A 02/06/2015   Procedure: Left Heart Cath and Coronary Angiography;  Surgeon: Leonie Man, MD;  Location: [place] CV LAB;  Service: Cardiovascular;  Laterality: N/A;   CARDIAC CATHETERIZATION N/A 02/06/2015   Procedure: Coronary Stent Intervention;  Surgeon: Leonie Man, MD;  Location: [place] CV LAB;  Service: Cardiovascular;  Laterality: N/A;   COLONOSCOPY WITH PROPOFOL N/A 10/09/2016   Sigmoid and descending colon diverticulosis, four 4-6 mm polyps in sigmoid, one 4 mm polyp in descending. Tubular adenomas and hyperplastic. 5 year surveillance.    COLONOSCOPY WITH PROPOFOL N/A 11/24/2016   Sigmoid and descending colon diverticulosis, four 4-6 mm polyps in sigmoid, one 4 mm polyp in descending. Tubular adenomas and hyperplastic. 5 year surveillance.    COLONOSCOPY WITH PROPOFOL N/A 10/18/2021   [place]: nonbleeding internal hemorrhoids, small and large mouth diverticula found in the sigmoid, descending, transverse colon.  A 9 mm sessile polyp was found in the ascending colon that was removed.  4 sessile polyps found in the sigmoid, descending, transverse colon 3 to 5 mm in size, examining otherwise.  Path revealed tubular adenomas.  Repeat due in 5 years for surveillance.   CORONARY ANGIOPLASTY WITH STENT PLACEMENT  01/2015   CORONARY STENT INTERVENTION N/A 04/27/2017   Procedure: CORONARY STENT INTERVENTION;  Surgeon: Nelva Bush, MD;  Location: [place] CV LAB;  Service: Cardiovascular;  Laterality: N/A;   ELECTROPHYSIOLOGY STUDY N/A 06/29/2017   Procedure: ELECTROPHYSIOLOGY STUDY;  Surgeon: Evans Lance, MD;   Location: [place] CV LAB;  Service: Cardiovascular;  Laterality: N/A;   ESOPHAGOGASTRODUODENOSCOPY (EGD) WITH PROPOFOL N/A 10/09/2016   Dr. Gala Romney: LA grade a esophagitis.  Barrett's esophagus, biopsy-proven.  Small hiatal hernia.  EGD February 2019   ESOPHAGOGASTRODUODENOSCOPY (EGD) WITH PROPOFOL N/A 11/28/2019    salmon-colored esophageal mucosa (Barrett's) small hiatal hernia, portal hypertensive gastropathy, normal duodenum, 3 year surveillance   ESOPHAGOGASTRODUODENOSCOPY (EGD) WITH PROPOFOL N/A 09/06/2021   three columns of grade 1 varices in distal esophagus, no stigmata of bleeding or red wale signs. Small hiatal hernia. Mild portal gastropathy. Normal duodenum. Repeat upper endoscopy in 1 year for surveillance.   FIDUCIAL MARKER PLACEMENT  10/16/2022   Procedure: FIDUCIAL MARKER PLACEMENT;  Surgeon: Maryjane Hurter, MD;  Location[facility] END[facility]COPY;  Service: Pulmonary;;   INCISION / DRAINAGE HAND / FINGER     LEFT HEART CATH AND CORONARY ANGIOGRAPHY N/A 04/27/2017   Procedure: LEFT HEART CATH AND CORONARY ANGIOGRAPHY;  Surgeon: Nelva Bush,  MD;  Location: [place] CV LAB;  Service: Cardiovascular;  Laterality: N/A;   LOOP RECORDER INSERTION  06/29/2017   Procedure: Loop Recorder Insertion;  Surgeon: Evans Lance, MD;  Location: [place] [facility] LAB;  Service: Cardiovascular;;   POLYPECTOMY  11/24/2016   Procedure: POLYPECTOMY;  Surgeon: Daneil Dolin, MD;  [facility];  Service: Endoscopy;;  descending and sigmoid   POLYPECTOMY  10/18/2021   Procedure: POLYPECTOMY;  Surgeon: Eloise Harman, DO;  [facility];  Service: Endoscopy;;   TOTAL HIP ARTHROPLASTY Right 07/01/2016   TOTAL HIP ARTHROPLASTY Right 07/01/2016   Procedure: RIGHT TOTAL HIP ARTHROPLASTY ANTERIOR APPROACH;  Surgeon: Mcarthur Rossetti, MD;  Location: [place];  Service: Orthopedics;  Laterality: Right;     FAMILY HISTORY:  Family History  Problem Relation Age of Onset   Heart  attack Father    Stroke Father    Arthritis Father    Heart disease Father    Cancer Mother        ???   Arthritis Mother    Heart disease Brother 14       died in sleep   Early death Brother    Diabetes Maternal Uncle    Alzheimer's disease Maternal Grandmother      SOCIAL HISTORY:  reports that he has been smoking cigarettes. He has a 46.00 pack-year smoking history. He has never been exposed to tobacco smoke. He has quit using smokeless tobacco.  His smokeless tobacco use included chew. He reports current alcohol use. He reports that he does not use drugs.  The patient is divorced and lives in [place]. His niece Ebony Hail is his next of kin. 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   ALLERGIES: Carvedilol, Lisinopril, Chantix [varenicline], and Amoxicillin   MEDICATIONS:  Current Outpatient Medications  Medication Sig Dispense Refill   acetaminophen (TYLENOL) 500 MG tablet Take 2 tablets (1,000 mg total) by mouth every 8 (eight) hours as needed for mild pain or fever.     albuterol (PROVENTIL) (2.5 MG/3ML) 0.083% nebulizer solution Take 3 mLs (2.5 mg total) by nebulization every 6 (six) hours as needed for wheezing or shortness of breath. 150 mL 1   albuterol (VENTOLIN HFA) 108 (90 Base) MCG/ACT inhaler INHALE 2 PUFFS INTO THE LUNGS EVERY 6 HOURS AS NEEDEDFOR SHORTNESS OF BREATH OR WHEEZING. 18 g 1   amLODipine (NORVASC) 10 MG tablet TAKE 1 TABLET BY MOUTH ONCE A DAY. 90 tablet 1   ascorbic acid (VITAMIN C) 500 MG tablet Take 1 tablet (500 mg total) by mouth daily. 30 tablet 1   aspirin 81 MG EC tablet Take 1 tablet (81 mg total) by mouth daily. (Patient taking differently: Take 81 mg by mouth in the morning and at bedtime.) 30 tablet    atorvastatin (LIPITOR) 80 MG tablet Take 1 tablet by mouth once daily 90 tablet 0   azelastine (ASTELIN) 0.1 % nasal spray USE 2 SPRAYS IN EACH NOSTRIL TWICE DAILY. 30 mL 11   Blood Glucose  Monitoring Suppl (ACCU-CHEK GUIDE ME) w/Device KIT 1 Piece by Does not apply route as directed. 1 kit 0   budesonide-formoterol (SYMBICORT) 160-4.5 MCG/ACT inhaler Take 2 puffs first thing in am and then another 2 puffs about 12 hours later. 1 each 12   Cholecalciferol (VITAMIN D3) 125 MCG (5000 UT) CAPS Take 1 capsule (5,000 Units total) by mouth daily. 90 capsule 0  citalopram (CELEXA) 20 MG tablet Take 1 tablet (20 mg total) by mouth daily. 90 tablet 3   cycloSPORINE (RESTASIS) 0.05 % ophthalmic emulsion Place 1 drop into both eyes 2 (two) times daily.     dapagliflozin propanediol (FARXIGA) 10 MG TABS tablet Take 10 mg by mouth daily.     Dulaglutide (TRULICITY) 1.5 VE/9.3YB SOPN INJECT 1.5 MG INTO THE SKIN ONCE WEEKLY. 6 mL 0   ferrous sulfate 325 (65 FE) MG tablet Take 1 tablet (325 mg total) by mouth daily with breakfast. 90 tablet 1   furosemide (LASIX) 40 MG tablet Take 1 tablet (40 mg total) by mouth daily as needed for fluid. 30 tablet 11   glipiZIDE (GLUCOTROL XL) 5 MG 24 hr tablet TAKE 1 TABLET BY MOUTH DAILY WITH BREAKFAST. 90 tablet 0   glucose blood (ACCU-CHEK GUIDE) test strip Use to test glucose 2 times daily. 100 each 2   lactulose (CHRONULAC) 10 GM/15ML solution TAKE 15 MLS BY MOUTH ONCE DAILY. 473 mL 0   magnesium gluconate (MAGONATE) 500 MG tablet Take 500 mg by mouth 2 (two) times daily.     metFORMIN (GLUCOPHAGE) 500 MG tablet TAKE 1 TABLET BY MOUTH TWICE DAILY WITH MEALS. 60 tablet 3   mometasone (ELOCON) 0.1 % cream Apply 1 application topically daily as needed (eczema on ears).     olopatadine (PATANOL) 0.1 % ophthalmic solution Place 1 drop into both eyes 2 (two) times daily.     pantoprazole (PROTONIX) 40 MG tablet TAKE (1) TABLET BY MOUTH TWICE DAILY BEFORE A MEAL. 180 tablet 3   varenicline (CHANTIX CONTINUING MONTH PAK) 1 MG tablet Take 1 tablet (1 mg total) by mouth daily. (Patient not taking: Reported on 10/17/2022) 30 tablet 1   varenicline (CHANTIX) 0.5 MG tablet  Take 1 tablet (0.5 mg total) by mouth daily. (Patient not taking: Reported on 10/17/2022) 60 tablet 0   No current facility-administered medications for this encounter.     REVIEW OF SYSTEMS: On review of systems, the patient reports that he is doing okay. He admits to fatigue and shortness of breath with exertion. He is not having more shortness of breath at this time with his post procedure pneumothorax. He is having productive clear/yellow phlegm with cough, and no hemoptysis since his biopsy. He does have low back pain that predates his imaging findings. He also has a loop recorder. No other complaints are verbalized.     PHYSICAL EXAM:  Wt Readings from Last 3 Encounters:  10/23/22 202 lb 6 oz (91.8 kg)  10/17/22 202 lb 12.8 oz (92 kg)  10/16/22 200 lb (90.7 kg)   Temp Readings from Last 3 Encounters:  10/23/22 (!) 97.2 F (36.2 C) (Temporal)  10/17/22 97.9 F (36.6 C) (Oral)  10/16/22 98.3 F (36.8 C)   BP Readings from Last 3 Encounters:  10/23/22 (!) 148/71  10/17/22 (!) 140/70  10/16/22 (!) 150/91   Pulse Readings from Last 3 Encounters:  10/23/22 72  10/17/22 94  10/16/22 79   Pain Assessment Pain Score: 10-Worst pain ever Pain Loc: Back/10  In general this is a chronically ill appearing caucasian male in no acute distress. He's alert and oriented x4 and appropriate throughout the examination. Cardiopulmonary assessment is negative for acute distress and he exhibits normal effort.     ECOG = 1  0 - Asymptomatic (Fully active, able to carry on all predisease activities without restriction)  1 - Symptomatic but completely ambulatory (Restricted in physically strenuous activity but  ambulatory and able to carry out work of a light or sedentary nature. For example, light housework, office work)  2 - Symptomatic, <50% in bed during the day (Ambulatory and capable of all self care but unable to carry out any work activities. Up and about more than 50% of waking  hours)  3 - Symptomatic, >50% in bed, but not bedbound (Capable of only limited self-care, confined to bed or chair 50% or more of waking hours)  4 - Bedbound (Completely disabled. Cannot carry on any self-care. Totally confined to bed or chair)  5 - Death   Eustace Pen MM, Creech RH, Tormey DC, et al. [phone]). "Toxicity and response criteria of the [facility] Group". [place] Oncol. 5 (6): 649-55    LABORATORY DATA:  Lab Results  Component Value Date   WBC 8.3 09/29/2022   HGB 16.5 09/29/2022   HCT 46.9 09/29/2022   MCV 86.7 09/29/2022   PLT 180 09/29/2022   Lab Results  Component Value Date   NA 136 09/29/2022   K 4.1 09/29/2022   CL 101 09/29/2022   CO2 24 09/29/2022   Lab Results  Component Value Date   ALT 44 09/29/2022   AST 32 09/29/2022   ALKPHOS 144 (H) 02/17/2022   BILITOT 1.3 (H) 09/29/2022      RADIOGRAPHY: DG Chest 2 View  Result Date: 10/21/2022 CLINICAL DATA:  Right pneumothorax. No pain. Status post right bronchoscopy 10/16/2022 EXAM: CHEST - 2 VIEW COMPARISON:  Chest two views 10/17/2022, AP chest 10/16/2022, AP chest 10/16/2022, AP chest 09/04/2021; CT chest 10/13/2022 FINDINGS: Interval decrease in size of now small right apical pneumothorax, now measuring up to approximately 12 mm in height compared to 33 mm on 10/17/2022 measured in a similar manner. Superior right lung nodule with central marker again noted. Anterior left chest wall cardiac loop recorder again seen. Cardiac silhouette and mediastinal contours are unchanged, and within normal limits with right-sided aortic arch again noted. Mild chronic interstitial thickening. No focal airspace opacity. Flattening of the diaphragms and mild hyperinflation is unchanged. No pleural effusion pneumothorax. Moderate multilevel degenerative disc changes of the mid to upper thoracic spine. IMPRESSION: 1. Interval decrease in size of small right apical pneumothorax, now measuring up to 12 mm in  height compared to 33 mm on 10/17/2022. Note is made the patient underwent right bronchoscopy 10/16/2022. 2. Superior right lung nodule with central marker again noted. Electronically Signed   By: Yvonne Kendall M.D.   On: 10/21/2022 14:45   DG Chest 2 View  Result Date: 10/21/2022 CLINICAL DATA:  Right pneumothorax. EXAM: CHEST - 2 VIEW COMPARISON:  Chest x-ray 10/16/2022.  CT chest 10/13/2022. FINDINGS: Cardiac monitoring device noted. Right-sided aortic arch again noted. Cardiomegaly, no pulmonary venous congestion. Low lung volumes with mild bibasilar atelectasis. Right apical nodule again noted. An adjacent small clip noted. Mild infiltrate right lung base cannot be excluded. Tiny right pleural effusion noted. Small right pneumothorax is again noted. Minimal increase in size from prior exam cannot be excluded. No acute bony abnormality identified. Degenerative change thoracic spine. IMPRESSION: 1. Small right pneumothorax is again noted. Minimal increase in size from prior exam cannot be excluded. 2. Right apical nodule again noted. An adjacent small clip noted. 3. Low lung volumes with mild bibasilar atelectasis. Mild infiltrate right lung base cannot be excluded. Tiny right pleural effusion noted. 4. Right-sided aortic arch again noted. Cardiomegaly. No pulmonary venous congestion. These results will be called to the ordering clinician  or representative by the Radiologist Assistant, and communication documented in the PACS or [employer]. Electronically Signed   By: Marcello Moores  Register M.D.   On: 10/21/2022 09:17   US Abdomen Limited RUQ (LIVER/GB)  Result Date: 10/20/2022 CLINICAL DATA:  Cirrhosis of liver. EXAM: ULTRASOUND ABDOMEN LIMITED RIGHT UPPER QUADRANT COMPARISON:  March 31, 2022 FINDINGS: Gallbladder: No gallstones or wall thickening visualized. Previously noted polyp is noted measuring 5 mm. No sonographic Murphy sign noted by sonographer. Common bile duct: Diameter: 4 mm Liver: No focal  lesion identified. Heterogeneous increased echotexture of the liver. Portal vein is patent on color Doppler imaging with normal direction of blood flow towards the liver. Other: None. IMPRESSION: 1. No acute abnormality identified. 2. Heterogeneous increased echotexture of the liver, consistent with history of cirrhosis. No focal liver lesion identified. 3. Previously noted polyp is noted measuring 5 mm. No further follow-up necessary. Electronically Signed   By: Abelardo Diesel M.D.   On: 10/20/2022 09:01   DG CHEST PORT 1 VIEW  Addendum Date: 10/16/2022   ADDENDUM REPORT: 10/16/2022 13:40 ADDENDUM: Study discussed by telephone with Dr. Leslye Peer on 10/16/2022 at 1314 hours. He advised that this is the 2nd post-procedural chest radiograph today - the earlier 1040 hours portable chest was also post bronchoscopy (surgical clip at the level of the medial right upper lung nodule at that time). And subtle right apical pneumothorax is also suspected in retrospect on that exam (obscured by the posterior 3rd rib). The pneumothorax volume therefore has not significantly changed since 1040 hours. Dr. Verlee Monte advised that the patient is currently asymptomatic. Electronically Signed   By: Genevie Ann M.D.   On: 10/16/2022 13:40   Result Date: 10/16/2022 CLINICAL DATA:  57 year old male status post bronchoscopy. EXAM: PORTABLE CHEST 1 VIEW COMPARISON:  Portable chest 1048 hours this morning, and earlier. FINDINGS: Portable AP semi upright view at 1229 hours. Stable lung volumes and mediastinal contours. Right side aortic arch variant as demonstrated by CT on 10/13/2022. There is a small right side pneumothorax, with pleural edge visible between the posterior 3rd and 4th ribs. Lung markings appear stable. No definite pleural effusion or acute opacity. Left chest cardiac loop recorder or lead less ICD again noted. Paucity of bowel gas. Stable visualized osseous structures. IMPRESSION: 1. Positive for a small right side  pneumothorax following bronchoscopy. 2. Otherwise stable chest, including right side aortic arch variant. Electronically Signed: By: Genevie Ann M.D. On: 10/16/2022 12:47   DG CHEST PORT 1 VIEW  Result Date: 10/16/2022 CLINICAL DATA:  Status post bronchoscopy. EXAM: PORTABLE CHEST 1 VIEW COMPARISON:  Chest CT 10/13/2022. FINDINGS: The right upper lobe nodular opacity is obscured by snap overlying the patient. Stable cardiac and mediastinal contours with right-sided aortic arch. No pleural effusion or pneumothorax. IMPRESSION: No pneumothorax. The right upper lobe nodular opacity is obscured by a snap overlying the patient. Electronically Signed   By: Emmit Alexanders M.D.   On: 10/16/2022 11:17   DG C-ARM BRONCHOSCOPY  Result Date: 10/16/2022 C-ARM BRONCHOSCOPY: Fluoroscopy was utilized by the requesting physician.  No radiographic interpretation.   DG C-Arm 1-60 Min-No Report  Result Date: 10/16/2022 Fluoroscopy was utilized by the requesting physician.  No radiographic interpretation.   CT Super D Chest Wo Contrast  Result Date: 10/13/2022 CLINICAL DATA:  Follow-up mildly hypermetabolic new apical right upper lobe pulmonary nodule seen on screening chest CT. Bronchoscopy scheduled. * Tracking Code: BO * EXAM: CT CHEST WITHOUT CONTRAST TECHNIQUE: Multidetector CT imaging of  the chest was performed using thin slice collimation for electromagnetic bronchoscopy planning purposes, without intravenous contrast. RADIATION DOSE REDUCTION: This exam was performed according to the departmental dose-optimization program which includes automated exposure control, adjustment of the mA and/or kV according to patient size and/or use of iterative reconstruction technique. COMPARISON:  07/17/2022 PET-CT.  06/25/2022 screening chest CT. FINDINGS: Cardiovascular: Normal heart size. No significant pericardial effusion/thickening. Three-vessel coronary atherosclerosis. Atherosclerotic nonaneurysmal thoracic aorta with  right-sided aortic arch. Dilated main pulmonary artery (3.7 cm diameter). Mediastinum/Nodes: No significant thyroid nodules. Unremarkable esophagus. No pathologically enlarged axillary, mediastinal or hilar lymph nodes, noting limited sensitivity for the detection of hilar adenopathy on this noncontrast study. Lungs/Pleura: No pneumothorax. No pleural effusion. Mild paraseptal and centrilobular emphysema with mild diffuse bronchial wall thickening. Irregular solid 1.1 x 0.8 cm anterior apical right upper lobe pulmonary nodule (series 7/image 25), slightly increased from 1.0 x 0.7 cm on 06/25/2022 CT using similar measurement technique. No acute consolidative airspace disease. Patchy indistinct centrilobular micronodularity throughout the mid to upper lungs bilaterally, generally new, generally ground-glass in density the, for example 0.4 cm in the superior segment right lower lobe (series 7/image 69). Upper abdomen: Diffusely irregular liver surface compatible with cirrhosis. Musculoskeletal: No aggressive appearing focal osseous lesions. Marked thoracic spondylosis. Subcutaneous loop recorder in the ventral medial upper left chest wall. IMPRESSION: 1. Irregular solid 1.1 cm anterior apical right upper lobe pulmonary nodule, slightly increased in size since 06/25/2022 CT, remaining suspicious for primary bronchogenic carcinoma. 2. No thoracic adenopathy. 3. Patchy indistinct centrilobular micronodularity throughout the mid to upper lungs bilaterally, generally new, generally ground-glass in density, favoring smoking related change (respiratory bronchiolitis). 4. Three-vessel coronary atherosclerosis. 5. Dilated main pulmonary artery, suggesting pulmonary arterial hypertension. 6. Cirrhosis. 7. Right-sided aortic arch. 8. Aortic Atherosclerosis (ICD10-I70.0) and Emphysema (ICD10-J43.9). Electronically Signed   By: Ilona Sorrel M.D.   On: 10/13/2022 11:28       IMPRESSION/PLAN: 1. Stage IA1, cT1aN0M0, NSCLC,  squamous cell carcinoma of the RUL. Dr. Lisbeth Renshaw discusses the pathology findings and reviews the nature of early stage lung cancer. Dr. Lisbeth Renshaw reviews that the standard of care is for surgical resection. However for patients who are not medical candidates to undergo surgery, or who choose to forgo surgery, stereotactic body radiotherapy (SBRT) is an appropriate alternative.  We discussed the risks, benefits, short, and long term effects of radiotherapy, as well as the curative intent, and the patient is interested in proceeding. Dr. Lisbeth Renshaw discusses the delivery and logistics of radiotherapy and anticipates a course of 3-5 fractions of radiotherapy. Written consent is obtained and placed in the chart, a copy was provided to the patient. The patient will be contacted to coordinate treatment planning by our simulation department.  2. Back Pain. He will follow up with his PCP.  3. Loop recorder. We will follow up with cardiology regarding this device as we proceed with radiation.  In a visit lasting 60 minutes, greater than 50% of the time was spent face to face discussing the patient's condition, in preparation for the discussion, and coordinating the patient's care.   The above documentation reflects my direct findings during this shared patient visit. Please see the separate note by Dr. Lisbeth Renshaw on this date for the remainder of the patient's plan of care.    Carola Rhine, [facility]   **Disclaimer: This note was dictated with voice recognition software. Similar sounding words can inadvertently be transcribed and this note may contain transcription errors which may not have been  corrected upon publication of note.**

## 2022-10-24 DIAGNOSIS — F17210 Nicotine dependence, cigarettes, uncomplicated: Secondary | ICD-10-CM | POA: Diagnosis not present

## 2022-10-24 DIAGNOSIS — C3411 Malignant neoplasm of upper lobe, right bronchus or lung: Secondary | ICD-10-CM | POA: Diagnosis not present

## 2022-10-27 ENCOUNTER — Other Ambulatory Visit: Payer: Self-pay | Admitting: Internal Medicine

## 2022-10-27 ENCOUNTER — Ambulatory Visit: Payer: 59 | Admitting: "Endocrinology

## 2022-10-27 ENCOUNTER — Ambulatory Visit: Payer: 59 | Admitting: Student

## 2022-10-27 ENCOUNTER — Telehealth: Payer: Self-pay | Admitting: "Endocrinology

## 2022-10-27 NOTE — Telephone Encounter (Signed)
Called pt and lvm to let him know he would not need to redo any labs

## 2022-10-27 NOTE — Telephone Encounter (Signed)
Pt was sick today and needed to reschedule.  Rescheduled for April 2.  Does he need to redo labs and if so can another order be put in?

## 2022-10-31 ENCOUNTER — Other Ambulatory Visit: Payer: Self-pay

## 2022-10-31 NOTE — Progress Notes (Signed)
The proposed treatment discussed in conference is for discussion purpose only and is not a binding recommendation.  The patients have not been physically examined, or presented with their treatment options.  Therefore, final treatment plans cannot be decided.  

## 2022-11-03 ENCOUNTER — Ambulatory Visit
Admission: RE | Admit: 2022-11-03 | Discharge: 2022-11-03 | Disposition: A | Discharge status: Home / Self Care | Payer: 59 | Source: Ambulatory Visit | Attending: Radiation Oncology | Admitting: Radiation Oncology

## 2022-11-03 ENCOUNTER — Ambulatory Visit (HOSPITAL_COMMUNITY)
Admission: RE | Admit: 2022-11-03 | Discharge: 2022-11-03 | Disposition: A | Discharge status: Home / Self Care | Payer: 59 | Source: Ambulatory Visit | Attending: Student | Admitting: Student

## 2022-11-03 DIAGNOSIS — F17210 Nicotine dependence, cigarettes, uncomplicated: Secondary | ICD-10-CM | POA: Diagnosis not present

## 2022-11-03 DIAGNOSIS — C3411 Malignant neoplasm of upper lobe, right bronchus or lung: Secondary | ICD-10-CM | POA: Diagnosis not present

## 2022-11-03 DIAGNOSIS — Z510 Encounter for antineoplastic radiation therapy: Secondary | ICD-10-CM | POA: Insufficient documentation

## 2022-11-03 DIAGNOSIS — J939 Pneumothorax, unspecified: Secondary | ICD-10-CM | POA: Insufficient documentation

## 2022-11-03 DIAGNOSIS — J439 Emphysema, unspecified: Secondary | ICD-10-CM | POA: Diagnosis not present

## 2022-11-06 DIAGNOSIS — C3411 Malignant neoplasm of upper lobe, right bronchus or lung: Secondary | ICD-10-CM | POA: Diagnosis not present

## 2022-11-06 DIAGNOSIS — F17210 Nicotine dependence, cigarettes, uncomplicated: Secondary | ICD-10-CM | POA: Diagnosis not present

## 2022-11-11 DIAGNOSIS — Z510 Encounter for antineoplastic radiation therapy: Secondary | ICD-10-CM | POA: Diagnosis not present

## 2022-11-11 DIAGNOSIS — C3411 Malignant neoplasm of upper lobe, right bronchus or lung: Secondary | ICD-10-CM | POA: Diagnosis not present

## 2022-11-11 DIAGNOSIS — F17210 Nicotine dependence, cigarettes, uncomplicated: Secondary | ICD-10-CM | POA: Diagnosis not present

## 2022-11-14 ENCOUNTER — Other Ambulatory Visit: Payer: Self-pay

## 2022-11-14 ENCOUNTER — Ambulatory Visit
Admission: RE | Admit: 2022-11-14 | Discharge: 2022-11-14 | Disposition: A | Discharge status: Home / Self Care | Payer: 59 | Source: Ambulatory Visit | Attending: Radiation Oncology | Admitting: Radiation Oncology

## 2022-11-14 ENCOUNTER — Ambulatory Visit (INDEPENDENT_AMBULATORY_CARE_PROVIDER_SITE_OTHER): Payer: 59

## 2022-11-14 VITALS — BP 130/74 | Ht 65.0 in | Wt 201.0 lb

## 2022-11-14 DIAGNOSIS — C3411 Malignant neoplasm of upper lobe, right bronchus or lung: Secondary | ICD-10-CM | POA: Diagnosis not present

## 2022-11-14 DIAGNOSIS — Z0000 Encounter for general adult medical examination without abnormal findings: Secondary | ICD-10-CM | POA: Diagnosis not present

## 2022-11-14 DIAGNOSIS — Z510 Encounter for antineoplastic radiation therapy: Secondary | ICD-10-CM | POA: Diagnosis not present

## 2022-11-14 LAB — RAD ONC ARIA SESSION SUMMARY
Course Elapsed Days: 0
Plan Fractions Treated to Date: 1
Plan Prescribed Dose Per Fraction: 18 Gy
Plan Total Fractions Prescribed: 3
Plan Total Prescribed Dose: 54 Gy
Reference Point Dosage Given to Date: 18 Gy
Reference Point Session Dosage Given: 18 Gy
Session Number: 1

## 2022-11-14 NOTE — Progress Notes (Signed)
Subjective:   Randy Hebert is a 57 y.o. male who presents for Medicare Annual/Subsequent preventive examination.  Review of Systems     Cardiac Risk Factors include: advanced age (>59men, >59 women);smoking/ tobacco exposure;hypertension;male gender;dyslipidemia;sedentary lifestyle;obesity (BMI >30kg/m2)     Objective:    Today's Vitals   11/14/22 1502  BP: 130/74  Weight: 201 lb (91.2 kg)  Height: 5\' 5"  (1.651 m)   Body mass index is 33.45 kg/m.     11/14/2022    4:19 PM 10/23/2022    2:42 PM 10/16/2022    7:47 AM 01/23/2022    3:19 PM 10/28/2021    8:48 AM 10/18/2021    6:29 AM 10/16/2021    8:06 AM  Advanced Directives  Does Patient Have a Medical Advance Directive? Yes Yes Yes Yes Yes No No  Type of Advance Directive Living will Healthcare Power of Attorney Living will [place];Living will Living will;Healthcare Power of Attorney    Does patient want to make changes to medical advance directive? No - Patient declined No - Patient declined  No - Patient declined No - Patient declined    Copy of [place] in Chart?  No - copy requested  No - copy requested Yes - validated most recent copy scanned in chart (See row information)    Would patient like information on creating a medical advance directive?  No - Patient declined   No - Patient declined  No - Patient declined    Current Medications (verified) Outpatient Encounter Medications as of 11/14/2022  Medication Sig   acetaminophen (TYLENOL) 500 MG tablet Take 2 tablets (1,000 mg total) by mouth every 8 (eight) hours as needed for mild pain or fever.   albuterol (PROVENTIL) (2.5 MG/3ML) 0.083% nebulizer solution Take 3 mLs (2.5 mg total) by nebulization every 6 (six) hours as needed for wheezing or shortness of breath.   albuterol (VENTOLIN HFA) 108 (90 Base) MCG/ACT inhaler INHALE 2 PUFFS INTO THE LUNGS EVERY 6 HOURS AS NEEDEDFOR SHORTNESS OF BREATH OR WHEEZING.   amLODipine  (NORVASC) 10 MG tablet TAKE 1 TABLET BY MOUTH ONCE A DAY.   ascorbic acid (VITAMIN C) 500 MG tablet Take 1 tablet (500 mg total) by mouth daily.   aspirin 81 MG EC tablet Take 1 tablet (81 mg total) by mouth daily. (Patient taking differently: Take 81 mg by mouth in the morning and at bedtime.)   atorvastatin (LIPITOR) 80 MG tablet Take 1 tablet by mouth once daily   azelastine (ASTELIN) 0.1 % nasal spray USE 2 SPRAYS IN EACH NOSTRIL TWICE DAILY.   Blood Glucose Monitoring Suppl (ACCU-CHEK GUIDE ME) w/Device KIT 1 Piece by Does not apply route as directed.   budesonide-formoterol (SYMBICORT) 160-4.5 MCG/ACT inhaler Take 2 puffs first thing in am and then another 2 puffs about 12 hours later.   Cholecalciferol (VITAMIN D3) 125 MCG (5000 UT) CAPS Take 1 capsule (5,000 Units total) by mouth daily.   citalopram (CELEXA) 20 MG tablet Take 1 tablet (20 mg total) by mouth daily.   cycloSPORINE (RESTASIS) 0.05 % ophthalmic emulsion Place 1 drop into both eyes 2 (two) times daily.   dapagliflozin propanediol (FARXIGA) 10 MG TABS tablet Take 10 mg by mouth daily.   Dulaglutide (TRULICITY) 1.5 0000000 SOPN INJECT 1.5 MG INTO THE SKIN ONCE WEEKLY.   ferrous sulfate 325 (65 FE) MG tablet Take 1 tablet (325 mg total) by mouth daily with breakfast.   furosemide (LASIX) 40 MG  tablet Take 1 tablet (40 mg total) by mouth daily as needed for fluid.   glipiZIDE (GLUCOTROL XL) 5 MG 24 hr tablet TAKE 1 TABLET BY MOUTH DAILY WITH BREAKFAST.   glucose blood (ACCU-CHEK GUIDE) test strip Use to test glucose 2 times daily.   lactulose (CHRONULAC) 10 GM/15ML solution TAKE 15 MLS BY MOUTH ONCE DAILY.   magnesium gluconate (MAGONATE) 500 MG tablet Take 500 mg by mouth 2 (two) times daily.   metFORMIN (GLUCOPHAGE) 500 MG tablet TAKE 1 TABLET BY MOUTH TWICE DAILY WITH MEALS.   mometasone (ELOCON) 0.1 % cream Apply 1 application topically daily as needed (eczema on ears).   olopatadine (PATANOL) 0.1 % ophthalmic solution Place  1 drop into both eyes 2 (two) times daily.   pantoprazole (PROTONIX) 40 MG tablet TAKE (1) TABLET BY MOUTH TWICE DAILY BEFORE A MEAL.   varenicline (CHANTIX CONTINUING MONTH PAK) 1 MG tablet Take 1 tablet (1 mg total) by mouth daily.   varenicline (CHANTIX) 0.5 MG tablet Take 1 tablet (0.5 mg total) by mouth daily.   No facility-administered encounter medications on file as of 11/14/2022.    Allergies (verified) Carvedilol, Lisinopril, Chantix [varenicline], and Amoxicillin   History: Past Medical History:  Diagnosis Date   ACE inhibitor-aggravated angioedema    Allergy    Anemia    Angio-edema    Anxiety    Arthritis    Asthma    hip replacement   Back pain    Bradycardia 04/28/2017   Bulging of cervical intervertebral disc    CAD (coronary artery disease)    lateral STEMI 02/06/2015 00% D1 occlusion treated with Promus Premier 2.5 mm x 16 mm DES, 70% ramus stenosis, 40% mid RCA stenosis, 45% distal RCA stenosis, EF 45-50%   CHF (congestive heart failure) (HCC)    COPD (chronic obstructive pulmonary disease) ([place])    Depression    Diabetes mellitus without complication ([place])    Difficult intubation    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   Dry eye    Dyspnea    Early satiety 09/23/2016   Fatty liver    GERD (gastroesophageal reflux disease)    HCAP (healthcare-associated pneumonia) 05/15/2017   Headache    Heart murmur    Hip pain    Hyperlipidemia    Hypertension    Lobar pneumonia ([place]) 05/15/2017   Melena 08/04/2018   MI (myocardial infarction) ([place])    Myocardial infarction ([place])    Neck pain    Non-ST elevation (NSTEMI) myocardial infarction ([place]) 04/27/2017   NSTEMI (non-ST elevated myocardial infarction) ([place]) 04/26/2017   Otitis media    Pleurisy    Pneumonia due to COVID-19 virus 10/07/2019   Rectal bleeding 11/08/2018   Right shoulder pain 03/20/2020   Sinus pause    9 sec sinus pause on telemetry after started on coreg after MI, avoid  AV nodal blocking agent   Sleep apnea    uses a cpap   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   STEMI (ST elevation myocardial infarction) ([place]) 05/31/2015   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   Syncope [id number]   Syncope and collapse 05/15/2017   Transaminitis 08/04/2018   Unilateral primary osteoarthritis, right hip 07/01/2016   Past Surgical History:  Procedure Laterality Date   BIOPSY  10/09/2016   Procedure: BIOPSY;  Surgeon: Daneil Dolin, MD;  [facility];  Service: Endoscopy;;   BIOPSY  11/28/2019   Procedure: BIOPSY;  Surgeon: Daneil Dolin, MD;  [facility];  Service: Endoscopy;;   BRONCHIAL BIOPSY  10/16/2022   Procedure: BRONCHIAL BIOPSIES;  Surgeon: Maryjane Hurter, MD;  Location: [place];  Service: Pulmonary;;   BRONCHIAL NEEDLE ASPIRATION BIOPSY  10/16/2022   Procedure: BRONCHIAL NEEDLE ASPIRATION BIOPSIES;  Surgeon: Maryjane Hurter, MD;  Location: [place];  Service: Pulmonary;;   BRONCHIAL WASHINGS  10/16/2022   Procedure: BRONCHIAL WASHINGS;  Surgeon: Maryjane Hurter, MD;  Location[facility];  Service: Pulmonary;;   CARDIAC CATHETERIZATION N/A 02/06/2015   Procedure: Left Heart Cath and Coronary Angiography;  Surgeon: Leonie Man, MD;  Location: [place] [facility];  Service: Cardiovascular;  Laterality: N/A;   CARDIAC CATHETERIZATION N/A 02/06/2015   Procedure: Coronary Stent Intervention;  Surgeon: Leonie Man, MD;  Location: [place] [facility];  Service: Cardiovascular;  Laterality: N/A;   COLONOSCOPY WITH PROPOFOL N/A 10/09/2016   Sigmoid and descending colon diverticulosis, four 4-6 mm polyps in sigmoid, one 4 mm polyp in descending. Tubular adenomas and hyperplastic. 5 year surveillance.    COLONOSCOPY WITH PROPOFOL N/A 11/24/2016   Sigmoid and descending colon diverticulosis, four 4-6 mm polyps in sigmoid, one 4 mm polyp in descending. Tubular adenomas and hyperplastic. 5 year surveillance.     COLONOSCOPY WITH PROPOFOL N/A 10/18/2021   [place]: nonbleeding internal hemorrhoids, small and large mouth diverticula found in the sigmoid, descending, transverse colon.  A 9 mm sessile polyp was found in the ascending colon that was removed.  4 sessile polyps found in the sigmoid, descending, transverse colon 3 to 5 mm in size, examining otherwise.  Path revealed tubular adenomas.  Repeat due in 5 years for surveillance.   CORONARY ANGIOPLASTY WITH STENT PLACEMENT  01/2015   CORONARY STENT INTERVENTION N/A 04/27/2017   Procedure: CORONARY STENT INTERVENTION;  Surgeon: Nelva Bush, MD;  Location: [place] [facility];  Service: Cardiovascular;  Laterality: N/A;   ELECTROPHYSIOLOGY STUDY N/A 06/29/2017   Procedure: ELECTROPHYSIOLOGY STUDY;  Surgeon: Evans Lance, MD;  Location: [place] [facility];  Service: Cardiovascular;  Laterality: N/A;   ESOPHAGOGASTRODUODENOSCOPY (EGD) WITH PROPOFOL N/A 10/09/2016   Dr. Gala Romney: LA grade a esophagitis.  Barrett's esophagus, biopsy-proven.  Small hiatal hernia.  EGD February 2019   ESOPHAGOGASTRODUODENOSCOPY (EGD) WITH PROPOFOL N/A 11/28/2019    salmon-colored esophageal mucosa (Barrett's) small hiatal hernia, portal hypertensive gastropathy, normal duodenum, 3 year surveillance   ESOPHAGOGASTRODUODENOSCOPY (EGD) WITH PROPOFOL N/A 09/06/2021   three columns of grade 1 varices in distal esophagus, no stigmata of bleeding or red wale signs. Small hiatal hernia. Mild portal gastropathy. Normal duodenum. Repeat upper endoscopy in 1 year for surveillance.   FIDUCIAL MARKER PLACEMENT  10/16/2022   Procedure: FIDUCIAL MARKER PLACEMENT;  Surgeon: Maryjane Hurter, MD;  Location: [facility]OSCOPY;  Service: Pulmonary;;   INCISION / DRAINAGE HAND / FINGER     LEFT HEART CATH AND CORONARY ANGIOGRAPHY N/A 04/27/2017   Procedure: LEFT HEART CATH AND CORONARY ANGIOGRAPHY;  Surgeon: Nelva Bush, MD;  Location: [place] [facility];  Service: Cardiovascular;   Laterality: N/A;   LOOP RECORDER INSERTION  06/29/2017   Procedure: Loop Recorder Insertion;  Surgeon: Evans Lance, MD;  Location: [place] [facility];  Service: Cardiovascular;;   POLYPECTOMY  11/24/2016   Procedure: POLYPECTOMY;  Surgeon: Daneil Dolin, MD;  [facility];  Service: Endoscopy;;  descending and sigmoid   POLYPECTOMY  10/18/2021   Procedure: POLYPECTOMY;  Surgeon: Eloise Harman,  DO;  [facility];  Service: Endoscopy;;   TOTAL HIP ARTHROPLASTY Right 07/01/2016   TOTAL HIP ARTHROPLASTY Right 07/01/2016   Procedure: RIGHT TOTAL HIP ARTHROPLASTY ANTERIOR APPROACH;  Surgeon: Mcarthur Rossetti, MD;  Location: [place];  Service: Orthopedics;  Laterality: Right;   Family History  Problem Relation Age of Onset   Heart attack Father    Stroke Father    Arthritis Father    Heart disease Father    Cancer Mother        ???   Arthritis Mother    Heart disease Brother 68       died in sleep   Early death Brother    Diabetes Maternal Uncle    Alzheimer's disease Maternal Grandmother    Social History   Socioeconomic History   Marital status: Divorced    Spouse name: alice   Number of children: 3   Years of education: 12   Highest education level: Not on file  Occupational History   Occupation: SSI  Tobacco Use   Smoking status: Every Day    Packs/day: 1.00    Years: 46.00    Additional pack years: 0.00    Total pack years: 46.00    Types: Cigarettes    Passive exposure: Never   Smokeless tobacco: Former    Types: Chew   Tobacco comments:    Smokes 1 pack a day of cigarettes   Vaping Use   Vaping Use: Former  Substance and Sexual Activity   Alcohol use: Yes    Comment: rare beer use   Drug use: No    Comment: history of drug use- marijuana, cocaine- quit 2014   Sexual activity: Yes    Partners: Female    Birth control/protection: None  Other Topics Concern   Not on file  Social History Narrative   Lives with girlfriend  [name]   3 children, but 1 OD  In 2019.   Grandchildren-2      Two cats: may and princess; and a dog-foxy      Enjoy: spending time with pets, TV, yard work       Diet: eats all food groups   Caffeine: half and half coffee, some tea-half and half decaf   Water: 6-8 cups daily      Wears seat belt   Does not use phone while driving   Smoke detectors at home      Social Determinants of Health   Financial Resource Strain: Low Risk  (11/14/2022)   Overall Financial Resource Strain (CARDIA)    Difficulty of Paying Living Expenses: Not hard at all  Food Insecurity: No Food Insecurity (11/14/2022)   Hunger Vital Sign    Worried About Running Out of Food in the Last Year: Never true    [place] in the Last Year: Never true  Transportation Needs: No Transportation Needs (11/14/2022)   PRAPARE - Hydrologist (Medical): No    Lack of Transportation (Non-Medical): No  Physical Activity: Inactive (11/14/2022)   Exercise Vital Sign    Days of Exercise per Week: 0 days    Minutes of Exercise per Session: 0 min  Stress: No Stress Concern Present (10/28/2021)   [place]    Feeling of Stress : Not at all  Social Connections: Moderately Integrated (11/14/2022)   Social Connection and Isolation Panel [NHANES]    Frequency of Communication with Friends and  Family: More than three times a week    Frequency of Social Gatherings with Friends and Family: Once a week    Attends Religious Services: More than 4 times per year    Active Member of [employer] or Organizations: Yes    Attends Music therapist: More than 4 times per year    Marital Status: Divorced    Tobacco Counseling Ready to quit: Not Answered Counseling given: Not Answered Tobacco comments: Smokes 1 pack a day of cigarettes    Clinical Intake:                 Diabetic?Yes  Nutrition Risk Assessment:  Has  the patient had any N/V/D within the last 2 months?  No  Does the patient have any non-healing wounds?  No  Has the patient had any unintentional weight loss or weight gain?  No   Diabetes:  Is the patient diabetic?  Yes  If diabetic, was a CBG obtained today?  No  Did the patient bring in their glucometer from home?  No  How often do you monitor your CBG's? As needed.   Financial Strains and Diabetes Management:  Are you having any financial strains with the device, your supplies or your medication? No .  Does the patient want to be seen by Chronic Care Management for management of their diabetes?  No  Would the patient like to be referred to a Nutritionist or for Diabetic Management?  No   Diabetic Exams:  Diabetic Eye Exam: Completed 06/26/22 Diabetic Foot Exam: Completed 11/21/21          Activities of Daily Living    11/14/2022    4:19 PM  In your present state of health, do you have any difficulty performing the following activities:  Hearing? 0  Vision? 0  Difficulty concentrating or making decisions? 0  Walking or climbing stairs? 0  Dressing or bathing? 0  Doing errands, shopping? 0  Preparing Food and eating ? N  Using the Toilet? N  In the past six months, have you accidently leaked urine? N  Do you have problems with loss of bowel control? N  Managing your Medications? N  Managing your Finances? N  Housekeeping or managing your Housekeeping? N    Patient Care Team: Coral Spikes, DO as PCP - General (Family Medicine) Evans Lance, MD as PCP - Electrophysiology (Cardiology) [name] Alphonse Guild, MD as PCP - Cardiology (Cardiology) Kyung Rudd, MD as Consulting Physician (Radiation Oncology) Madelin Headings, DO (Optometry) Dorris Fetch Marella Chimes, MD as Consulting Physician (Endocrinology) Garner Nash, DO as Consulting Physician (Pulmonary Disease) Eloise Harman, DO as Consulting Physician (Gastroenterology) Grayland Ormond, MD as Referring  Physician (Neurosurgery)  Indicate any recent Medical Services you may have received from other than [facility] providers in the past year (date may be approximate).     Assessment:   This is a routine wellness examination for [place].  Hearing/Vision screen No results found.  Dietary issues and exercise activities discussed: Current Exercise Habits: The patient does not participate in regular exercise at present   Goals Addressed             This Visit's Progress    COMPLETED: Medication Management       Patient Goals/Self-Care Activities Over the next 90 days, patient will:  Take medications as prescribed Focus on medication adherence by keeping up with prescription refills and either using a pill box or reminders to take your medications at the  prescribed times Check blood sugar three times a day at the following times: fasting (at least 8 hours since last food consumption), 1-2 hours after breakfast, bedtime, and whenever patient experiences symptoms of hypo/hyperglycemia, document, and provide at future appointments Check blood pressure at least once daily, document, and provide at future appointments Weigh daily, and contact provider if weight gain of more than 3 lbs in 1 day or more than 5 lbs in 1 week Target a minimum of 150 minutes of moderate intensity exercise weekly Engage in dietary modifications by fewer sweetened foods & beverages, better food choices, and watch portion sizes/amount of food eaten at one time       Depression Screen    11/14/2022    4:18 PM 06/25/2022    2:13 PM 06/25/2022    2:12 PM 01/30/2022    2:58 PM 01/23/2022    3:17 PM 01/23/2022    2:56 PM 11/21/2021    1:58 PM  PHQ 2/9 Scores  PHQ - 2 Score 0 0 0 0 0 0 0  PHQ- 9 Score  0     0    Fall Risk    06/25/2022    2:12 PM 01/30/2022    2:58 PM 01/23/2022    2:58 PM 11/21/2021    1:58 PM 11/13/2021    2:18 PM  [place] in the past year? 0 0 1 0 1  Number falls in past yr: 0 0 1 0 0   Injury with Fall? 0 0 0 0 1  Risk for fall due to : No Fall Risks No Fall Risks  No Fall Risks History of fall(s)  Follow up Falls evaluation completed Falls evaluation completed  Falls evaluation completed Falls evaluation completed    FALL RISK PREVENTION PERTAINING TO THE HOME:  Any stairs in or around the home? No  If so, are there any without handrails? No  Home free of loose throw rugs in walkways, pet beds, electrical cords, etc? Yes  Adequate lighting in your home to reduce risk of falls? Yes   ASSISTIVE DEVICES UTILIZED TO PREVENT FALLS:  Life alert? No  Use of a cane, [place] or w/c? No  Grab bars in the bathroom? Yes  Shower chair or bench in shower? No  Elevated toilet seat or a handicapped toilet? Yes   TIMED UP AND GO:  Was the test performed? Yes .  Length of time to ambulate 10 feet: 5 sec.   Gait steady and fast without use of assistive device  Cognitive Function:        11/14/2022    4:19 PM 10/28/2021    9:02 AM 10/24/2020    8:48 AM  6CIT Screen  What Year? 0 points 0 points 0 points  What month? 0 points 0 points 0 points  What time? 0 points 0 points 0 points  Count back from 20 0 points 0 points 0 points  Months in reverse 0 points 0 points 0 points  Repeat phrase 0 points 0 points 0 points  Total Score 0 points 0 points 0 points    Immunizations Immunization History  Administered Date(s) Administered   Influenza,inj,Quad PF,6+ Mos 07/03/2016, 05/07/2017, 06/20/2020, 07/24/2021, 06/13/2022   Influenza-Unspecified 05/17/2018   Moderna SARS-COV2 Booster Vaccination 03/01/2020   Moderna Sars-Covid-2 Vaccination 02/20/2020   Pneumococcal Conjugate-13 06/17/2017   Pneumococcal Polysaccharide-23 02/07/2015   Tdap 05/17/2019    TDAP status: Up to date  Flu Vaccine status: Up to date  Pneumococcal  vaccine status: Up to date  Covid-19 vaccine status: Information provided on how to obtain vaccines.   Qualifies for Shingles Vaccine? Yes    Zostavax completed No   Shingrix Completed?: No.    Education has been provided regarding the importance of this vaccine. Patient has been advised to call insurance company to determine out of pocket expense if they have not yet received this vaccine. Advised may also receive vaccine at local pharmacy or Health Dept. Verbalized acceptance and understanding.  Screening Tests Health Maintenance  Topic Date Due   Zoster Vaccines- Shingrix (1 of 2) Never done   COVID-19 Vaccine (2 - Moderna risk series) 03/29/2020   FOOT EXAM  11/22/2022   HEMOGLOBIN A1C  12/23/2022   Diabetic kidney evaluation - Urine ACR  02/18/2023   OPHTHALMOLOGY EXAM  06/27/2023   Diabetic kidney evaluation - eGFR measurement  09/30/2023   Medicare Annual Wellness (AWV)  11/14/2023   DTaP/Tdap/Td (2 - Td or Tdap) 05/16/2029   COLONOSCOPY (Pts 45-26yrs Insurance coverage will need to be confirmed)  10/19/2031   INFLUENZA VACCINE  Completed   Hepatitis C Screening  Completed   HIV Screening  Completed   HPV VACCINES  Aged Out   Lung Cancer Screening  Discontinued    Health Maintenance  Health Maintenance Due  Topic Date Due   Zoster Vaccines- Shingrix (1 of 2) Never done   COVID-19 Vaccine (2 - Moderna risk series) 03/29/2020    Colorectal cancer screening: Type of screening: Colonoscopy. Completed 10/18/21. Repeat every 10 years  Lung Cancer Screening: (Low Dose CT Chest recommended if Age 53-80 years, 30 pack-year currently smoking OR have quit w/in 15years.) does qualify.   Lung Cancer Screening Referral: discontinued due to lung cx diagnosis   Additional Screening:  Hepatitis C Screening: does qualify; Completed 08/11/18  Vision Screening: Recommended annual ophthalmology exams for early detection of glaucoma and other disorders of the eye. Is the patient up to date with their annual eye exam?  Yes  Who is the provider or what is the name of the office in which the patient attends annual eye exams? Dr.  Jorja Loa If pt is not established with a provider, would they like to be referred to a provider to establish care? No .   Dental Screening: Recommended annual dental exams for proper oral hygiene  Community Resource Referral / Chronic Care Management: CRR required this visit?  No   CCM required this visit?  No      Plan:     I have personally reviewed and noted the following in the patient's chart:   Medical and social history Use of alcohol, tobacco or illicit drugs  Current medications and supplements including opioid prescriptions. Patient is currently taking opioid prescriptions. Information provided to patient regarding non-opioid alternatives. Patient advised to discuss non-opioid treatment plan with their provider. Functional ability and status Nutritional status Physical activity Advanced directives List of other physicians Hospitalizations, surgeries, and ER visits in previous 12 months Vitals Screenings to include cognitive, depression, and falls Referrals and appointments  In addition, I have reviewed and discussed with patient certain preventive protocols, quality metrics, and best practice recommendations. A written personalized care plan for preventive services as well as general preventive health recommendations were provided to patient.     Denman George [place], [place]   [id number]   Nurse Notes: No concerns;  see telephone note with refill and supply request

## 2022-11-14 NOTE — Patient Instructions (Signed)
Randy Hebert , Thank you for taking time to come for your Medicare Wellness Visit. I appreciate your ongoing commitment to your health goals. Please review the following plan we discussed and let me know if I can assist you in the future.   These are the goals we discussed:  Goals      Quit Smoking     Decrease smoking and plans on quitting        This is a list of the screening recommended for you and due dates:  Health Maintenance  Topic Date Due   Zoster (Shingles) Vaccine (1 of 2) Never done   COVID-19 Vaccine (2 - Moderna risk series) 03/29/2020   Complete foot exam   11/22/2022   Hemoglobin A1C  12/23/2022   Yearly kidney health urinalysis for diabetes  02/18/2023   Eye exam for diabetics  06/27/2023   Yearly kidney function blood test for diabetes  09/30/2023   Medicare Annual Wellness Visit  11/14/2023   DTaP/Tdap/Td vaccine (2 - Td or Tdap) 05/16/2029   Colon Cancer Screening  10/19/2031   Flu Shot  Completed   Hepatitis C Screening: USPSTF Recommendation to screen - Ages 18-79 yo.  Completed   HIV Screening  Completed   HPV Vaccine  Aged Out   Screening for Lung Cancer  Discontinued    Advanced directives: Please bring a copy of your health care power of attorney and living will to the office to be added to your chart at your convenience.   Conditions/risks identified: Aim for 30 minutes of exercise or brisk walking, 6-8 glasses of water, and 5 servings of fruits and vegetables each day.   Next appointment: Follow up in one year for your annual wellness visit   Preventive Care 40-64 Years, Male Preventive care refers to lifestyle choices and visits with your health care provider that can promote health and wellness. What does preventive care include? A yearly physical exam. This is also called an annual well check. Dental exams once or twice a year. Routine eye exams. Ask your health care provider how often you should have your eyes checked. Personal lifestyle  choices, including: Daily care of your teeth and gums. Regular physical activity. Eating a healthy diet. Avoiding tobacco and drug use. Limiting alcohol use. Practicing safe sex. Taking low-dose aspirin every day starting at age 41. What happens during an annual well check? The services and screenings done by your health care provider during your annual well check will depend on your age, overall health, lifestyle risk factors, and family history of disease. Counseling  Your health care provider may ask you questions about your: Alcohol use. Tobacco use. Drug use. Emotional well-being. Home and relationship well-being. Sexual activity. Eating habits. Work and work Statistician. Screening  You may have the following tests or measurements: Height, weight, and BMI. Blood pressure. Lipid and cholesterol levels. These may be checked every 5 years, or more frequently if you are over 55 years old. Skin check. Lung cancer screening. You may have this screening every year starting at age 5 if you have a 30-pack-year history of smoking and currently smoke or have quit within the past 15 years. Fecal occult blood test (FOBT) of the stool. You may have this test every year starting at age 40. Flexible sigmoidoscopy or colonoscopy. You may have a sigmoidoscopy every 5 years or a colonoscopy every 10 years starting at age 60. Prostate cancer screening. Recommendations will vary depending on your family history and other risks. Hepatitis  C blood test. Hepatitis B blood test. Sexually transmitted disease (STD) testing. Diabetes screening. This is done by checking your blood sugar (glucose) after you have not eaten for a while (fasting). You may have this done every 1-3 years. Discuss your test results, treatment options, and if necessary, the need for more tests with your health care provider. Vaccines  Your health care provider may recommend certain vaccines, such as: Influenza vaccine. This is  recommended every year. Tetanus, diphtheria, and acellular pertussis (Tdap, Td) vaccine. You may need a Td booster every 10 years. Zoster vaccine. You may need this after age 14. Pneumococcal 13-valent conjugate (PCV13) vaccine. You may need this if you have certain conditions and have not been vaccinated. Pneumococcal polysaccharide (PPSV23) vaccine. You may need one or two doses if you smoke cigarettes or if you have certain conditions. Talk to your health care provider about which screenings and vaccines you need and how often you need them. This information is not intended to replace advice given to you by your health care provider. Make sure you discuss any questions you have with your health care provider. Document Released: 09/14/2015 Document Revised: 05/07/2016 Document Reviewed: 06/19/2015 Elsevier Interactive Patient Education  2017 [place] Prevention in the Home Falls can cause injuries. They can happen to people of all ages. There are many things you can do to make your home safe and to help prevent falls. What can I do on the outside of my home? Regularly fix the edges of walkways and driveways and fix any cracks. Remove anything that might make you trip as you walk through a door, such as a raised step or threshold. Trim any bushes or trees on the path to your home. Use bright outdoor lighting. Clear any walking paths of anything that might make someone trip, such as rocks or tools. Regularly check to see if handrails are loose or broken. Make sure that both sides of any steps have handrails. Any raised decks and porches should have guardrails on the edges. Have any leaves, snow, or ice cleared regularly. Use sand or salt on walking paths during winter. Clean up any spills in your garage right away. This includes oil or grease spills. What can I do in the bathroom? Use night lights. Install grab bars by the toilet and in the tub and shower. Do not use towel bars  as grab bars. Use non-skid mats or decals in the tub or shower. If you need to sit down in the shower, use a plastic, non-slip stool. Keep the floor dry. Clean up any water that spills on the floor as soon as it happens. Remove soap buildup in the tub or shower regularly. Attach bath mats securely with double-sided non-slip rug tape. Do not have throw rugs and other things on the floor that can make you trip. What can I do in the bedroom? Use night lights. Make sure that you have a light by your bed that is easy to reach. Do not use any sheets or blankets that are too big for your bed. They should not hang down onto the floor. Have a firm chair that has side arms. You can use this for support while you get dressed. Do not have throw rugs and other things on the floor that can make you trip. What can I do in the kitchen? Clean up any spills right away. Avoid walking on wet floors. Keep items that you use a lot in easy-to-reach places. If you need  to reach something above you, use a strong step stool that has a grab bar. Keep electrical cords out of the way. Do not use floor polish or wax that makes floors slippery. If you must use wax, use non-skid floor wax. Do not have throw rugs and other things on the floor that can make you trip. What can I do with my stairs? Do not leave any items on the stairs. Make sure that there are handrails on both sides of the stairs and use them. Fix handrails that are broken or loose. Make sure that handrails are as long as the stairways. Check any carpeting to make sure that it is firmly attached to the stairs. Fix any carpet that is loose or worn. Avoid having throw rugs at the top or bottom of the stairs. If you do have throw rugs, attach them to the floor with carpet tape. Make sure that you have a light switch at the top of the stairs and the bottom of the stairs. If you do not have them, ask someone to add them for you. What else can I do to help  prevent falls? Wear shoes that: Do not have high heels. Have rubber bottoms. Are comfortable and fit you well. Are closed at the toe. Do not wear sandals. If you use a stepladder: Make sure that it is fully opened. Do not climb a closed stepladder. Make sure that both sides of the stepladder are locked into place. Ask someone to hold it for you, if possible. Clearly mark and make sure that you can see: Any grab bars or handrails. First and last steps. Where the edge of each step is. Use tools that help you move around (mobility aids) if they are needed. These include: Canes. Walkers. Scooters. Crutches. Turn on the lights when you go into a dark area. Replace any light bulbs as soon as they burn out. Set up your furniture so you have a clear path. Avoid moving your furniture around. If any of your floors are uneven, fix them. If there are any pets around you, be aware of where they are. Review your medicines with your doctor. Some medicines can make you feel dizzy. This can increase your chance of falling. Ask your doctor what other things that you can do to help prevent falls. This information is not intended to replace advice given to you by your health care provider. Make sure you discuss any questions you have with your health care provider. Document Released: 06/14/2009 Document Revised: 01/24/2016 Document Reviewed: 09/22/2014 Elsevier Interactive Patient Education  2017 [employer].

## 2022-11-17 ENCOUNTER — Ambulatory Visit: Payer: 59

## 2022-11-17 ENCOUNTER — Other Ambulatory Visit: Payer: Self-pay | Admitting: "Endocrinology

## 2022-11-17 DIAGNOSIS — E1159 Type 2 diabetes mellitus with other circulatory complications: Secondary | ICD-10-CM

## 2022-11-18 ENCOUNTER — Ambulatory Visit: Payer: 59

## 2022-11-19 ENCOUNTER — Ambulatory Visit
Admission: RE | Admit: 2022-11-19 | Discharge: 2022-11-19 | Disposition: A | Discharge status: Home / Self Care | Payer: 59 | Source: Ambulatory Visit | Attending: Radiation Oncology | Admitting: Radiation Oncology

## 2022-11-19 ENCOUNTER — Encounter: Payer: Self-pay | Admitting: Radiation Oncology

## 2022-11-19 ENCOUNTER — Other Ambulatory Visit: Payer: Self-pay

## 2022-11-19 DIAGNOSIS — C3411 Malignant neoplasm of upper lobe, right bronchus or lung: Secondary | ICD-10-CM | POA: Diagnosis not present

## 2022-11-19 DIAGNOSIS — Z510 Encounter for antineoplastic radiation therapy: Secondary | ICD-10-CM | POA: Diagnosis not present

## 2022-11-19 LAB — RAD ONC ARIA SESSION SUMMARY
Course Elapsed Days: 5
Plan Fractions Treated to Date: 2
Plan Prescribed Dose Per Fraction: 18 Gy
Plan Total Fractions Prescribed: 3
Plan Total Prescribed Dose: 54 Gy
Reference Point Dosage Given to Date: 36 Gy
Reference Point Session Dosage Given: 18 Gy
Session Number: 2

## 2022-11-20 ENCOUNTER — Telehealth: Payer: Self-pay | Admitting: Family Medicine

## 2022-11-20 NOTE — Telephone Encounter (Signed)
Patient dropped off  [employer] form to be completed in your box.

## 2022-11-21 ENCOUNTER — Ambulatory Visit
Admission: RE | Admit: 2022-11-21 | Discharge: 2022-11-21 | Disposition: A | Discharge status: Home / Self Care | Payer: 59 | Source: Ambulatory Visit | Attending: Radiation Oncology | Admitting: Radiation Oncology

## 2022-11-21 ENCOUNTER — Other Ambulatory Visit: Payer: Self-pay

## 2022-11-21 DIAGNOSIS — C3411 Malignant neoplasm of upper lobe, right bronchus or lung: Secondary | ICD-10-CM | POA: Diagnosis not present

## 2022-11-21 DIAGNOSIS — F17210 Nicotine dependence, cigarettes, uncomplicated: Secondary | ICD-10-CM | POA: Diagnosis not present

## 2022-11-21 DIAGNOSIS — Z510 Encounter for antineoplastic radiation therapy: Secondary | ICD-10-CM | POA: Diagnosis not present

## 2022-11-21 LAB — RAD ONC ARIA SESSION SUMMARY
Course Elapsed Days: 7
Plan Fractions Treated to Date: 3
Plan Prescribed Dose Per Fraction: 18 Gy
Plan Total Fractions Prescribed: 3
Plan Total Prescribed Dose: 54 Gy
Reference Point Dosage Given to Date: 54 Gy
Reference Point Session Dosage Given: 18 Gy
Session Number: 3

## 2022-11-30 NOTE — Progress Notes (Unsigned)
Synopsis: Referred for pulmonary nodule by Coral Spikes, DO  Subjective:   PATIENT ID: Randy Hebert GENDER: male DOB: [birth year], MRN: [id number]  No chief complaint on file.  57yM with history of COPD, CAD, OSA, alcoholic/nonalcoholic cirrhosis, ?difficult intubation due to vocal cord injury referred for pet avid pulmonary nodule RUL  No recent courses of prednisone for AECOPD.   On level ground he is not limited by DOE. Can climb 1-2 flights of stairs before needs to stop for DOE.   Using symbicort 2 puffs twice daily, rinsing mouth after use.   No clear cut family history of lung cancer. His aunt needed lung transplant but unsure why  He is disabled, worked in past in Science writer. Still smoking 1 ppd. Off alcohol since January 2023. Off cocaine since 8 ya.   Interval HPI Decision made for SBRT of RUL stage I NSCLC  Post bronch with iatrogenic ptx which resolved with conservative management     Otherwise pertinent review of systems is negative.   Past Medical History:  Diagnosis Date   ACE inhibitor-aggravated angioedema    Allergy    Anemia    Angio-edema    Anxiety    Arthritis    Asthma    hip replacement   Back pain    Bradycardia 04/28/2017   Bulging of cervical intervertebral disc    CAD (coronary artery disease)    lateral STEMI 02/06/2015 00% D1 occlusion treated with Promus Premier 2.5 mm x 16 mm DES, 70% ramus stenosis, 40% mid RCA stenosis, 45% distal RCA stenosis, EF 45-50%   CHF (congestive heart failure) (HCC)    COPD (chronic obstructive pulmonary disease) ([place])    Depression    Diabetes mellitus without complication ([place])    Difficult intubation    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   Dry eye    Dyspnea    Early satiety 09/23/2016   Fatty liver    GERD (gastroesophageal reflux disease)    HCAP (healthcare-associated pneumonia) 05/15/2017   Headache    Heart murmur    Hip pain    Hyperlipidemia     Hypertension    Lobar pneumonia ([place]) 05/15/2017   Melena 08/04/2018   MI (myocardial infarction) [facility])    Myocardial infarction ([place])    Neck pain    Non-ST elevation (NSTEMI) myocardial infarction ([place]) 04/27/2017   NSTEMI (non-ST elevated myocardial infarction) ([place]) 04/26/2017   Otitis media    Pleurisy    Pneumonia due to COVID-19 virus 10/07/2019   Rectal bleeding 11/08/2018   Right shoulder pain 03/20/2020   Sinus pause    9 sec sinus pause on telemetry after started on coreg after MI, avoid AV nodal blocking agent   Sleep apnea    uses a cpap   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   Arthritis Mother    Heart disease Brother 22       died in sleep   Early death  Brother    Diabetes Maternal Uncle    Alzheimer's disease Maternal Grandmother      Past Surgical History:  Procedure Laterality Date   BIOPSY  10/09/2016   Procedure: BIOPSY;  Surgeon: Daneil Dolin, MD;  [facility]: [facility];  Service: Endoscopy;;   BIOPSY  11/28/2019   Procedure: BIOPSY;  Surgeon: Daneil Dolin, MD;  [facility];  Service: Endoscopy;;   BRONCHIAL BIOPSY  10/16/2022   Procedure: BRONCHIAL BIOPSIES;  Surgeon: Maryjane Hurter, MD;  Location: [place];  Service: Pulmonary;;   BRONCHIAL NEEDLE ASPIRATION BIOPSY  10/16/2022   Procedure: BRONCHIAL NEEDLE ASPIRATION BIOPSIES;  Surgeon: Maryjane Hurter, MD;  Location: [place];  Service: Pulmonary;;   BRONCHIAL WASHINGS  10/16/2022   Procedure: BRONCHIAL WASHINGS;  Surgeon: Maryjane Hurter, MD;   Location: [facility] ENDOSCOPY;  Service: Pulmonary;;   CARDIAC CATHETERIZATION N/A 02/06/2015   Procedure: Left Heart Cath and Coronary Angiography;  Surgeon: Leonie Man, MD;  Location: [place] CV LAB;  Service: Cardiovascular;  Laterality: N/A;   CARDIAC CATHETERIZATION N/A 02/06/2015   Procedure: Coronary Stent Intervention;  Surgeon: Leonie Man, MD;  Location: [place] [facility];  Service: Cardiovascular;  Laterality: N/A;   COLONOSCOPY WITH PROPOFOL N/A 10/09/2016   Sigmoid and descending colon diverticulosis, four 4-6 mm polyps in sigmoid, one 4 mm polyp in descending. Tubular adenomas and hyperplastic. 5 year surveillance.    COLONOSCOPY WITH PROPOFOL N/A 11/24/2016   Sigmoid and descending colon diverticulosis, four 4-6 mm polyps in sigmoid, one 4 mm polyp in descending. Tubular adenomas and hyperplastic. 5 year surveillance.    COLONOSCOPY WITH PROPOFOL N/A 10/18/2021   [place]: nonbleeding internal hemorrhoids, small and large mouth diverticula found in the sigmoid, descending, transverse colon.  A 9 mm sessile polyp was found in the ascending colon that was removed.  4 sessile polyps found in the sigmoid, descending, transverse colon 3 to 5 mm in size, examining otherwise.  Path revealed tubular adenomas.  Repeat due in 5 years for surveillance.   CORONARY ANGIOPLASTY WITH STENT PLACEMENT  01/2015   CORONARY STENT INTERVENTION N/A 04/27/2017   Procedure: CORONARY STENT INTERVENTION;  Surgeon: Nelva Bush, MD;  Location: [place] [facility];  Service: Cardiovascular;  Laterality: N/A;   ELECTROPHYSIOLOGY STUDY N/A 06/29/2017   Procedure: ELECTROPHYSIOLOGY STUDY;  Surgeon: Evans Lance, MD;  Location: [place] [facility];  Service: Cardiovascular;  Laterality: N/A;   ESOPHAGOGASTRODUODENOSCOPY (EGD) WITH PROPOFOL N/A 10/09/2016   Dr. Gala Romney: LA grade a esophagitis.  Barrett's esophagus, biopsy-proven.  Small hiatal hernia.  EGD February 2019   ESOPHAGOGASTRODUODENOSCOPY (EGD)  WITH PROPOFOL N/A 11/28/2019    salmon-colored esophageal mucosa (Barrett's) small hiatal hernia, portal hypertensive gastropathy, normal duodenum, 3 year surveillance   ESOPHAGOGASTRODUODENOSCOPY (EGD) WITH PROPOFOL N/A 09/06/2021   three columns of grade 1 varices in distal esophagus, no stigmata of bleeding or red wale signs. Small hiatal hernia. Mild portal gastropathy. Normal duodenum. Repeat upper endoscopy in 1 year for surveillance.   FIDUCIAL MARKER PLACEMENT  10/16/2022   Procedure: FIDUCIAL MARKER PLACEMENT;  Surgeon: Maryjane Hurter, MD;  Location: [facility] ENDOSCOPY;  Service: Pulmonary;;   INCISION / DRAINAGE HAND / FINGER     LEFT HEART CATH AND CORONARY ANGIOGRAPHY N/A 04/27/2017   Procedure: LEFT HEART CATH AND CORONARY ANGIOGRAPHY;  Surgeon: Nelva Bush, MD;  Location: [place] [facility];  Service: Cardiovascular;  Laterality: N/A;   LOOP RECORDER INSERTION  06/29/2017   Procedure:  Loop Recorder Insertion;  Surgeon: Evans Lance, MD;  Location: [place] [facility];  Service: Cardiovascular;;   POLYPECTOMY  11/24/2016   Procedure: POLYPECTOMY;  Surgeon: Daneil Dolin, MD;  [facility];  Service: Endoscopy;;  descending and sigmoid   POLYPECTOMY  10/18/2021   Procedure: POLYPECTOMY;  Surgeon: Eloise Harman, DO;  [facility];  Service: Endoscopy;;   TOTAL HIP ARTHROPLASTY Right 07/01/2016   TOTAL HIP ARTHROPLASTY Right 07/01/2016   Procedure: RIGHT TOTAL HIP ARTHROPLASTY ANTERIOR APPROACH;  Surgeon: Mcarthur Rossetti, MD;  Location: [place];  Service: Orthopedics;  Laterality: Right;    Social History   Socioeconomic History   Marital status: Divorced    Spouse name: alice   Number of children: 3   Years of education: 12   Highest education level: Not on file  Occupational History   Occupation: SSI  Tobacco Use   Smoking status: Every Day    Packs/day: 1.00    Years: 46.00    Additional pack years: 0.00    Total pack years: 46.00     Types: Cigarettes    Passive exposure: Never   Smokeless tobacco: Former    Types: Chew   Tobacco comments:    Smokes 1 pack a day of cigarettes   Vaping Use   Vaping Use: Former  Substance and Sexual Activity   Alcohol use: Yes    Comment: rare beer use   Drug use: No    Comment: history of drug use- marijuana, cocaine- quit 2014   Sexual activity: Yes    Partners: Female    Birth control/protection: None  Other Topics Concern   Not on file  Social History Narrative   Lives with girlfriend [name]   3 children, but 1 OD  In 2019.   Grandchildren-2      Two cats: may and princess; and a dog-foxy      Enjoy: spending time with pets, TV, yard work       Diet: eats all food groups   Caffeine: half and half coffee, some tea-half and half decaf   Water: 6-8 cups daily      Wears seat belt   Does not use phone while driving   Smoke detectors at home      Social Determinants of Health   Financial Resource Strain: Low Risk  (11/14/2022)   Overall Financial Resource Strain (CARDIA)    Difficulty of Paying Living Expenses: Not hard at all  Food Insecurity: No Food Insecurity (11/14/2022)   Hunger Vital Sign    Worried About Running Out of Food in the Last Year: Never true    [place] in the Last Year: Never true  Transportation Needs: No Transportation Needs (11/14/2022)   PRAPARE - Hydrologist (Medical): No    Lack of Transportation (Non-Medical): No  Physical Activity: Inactive (11/14/2022)   Exercise Vital Sign    Days of Exercise per Week: 0 days    Minutes of Exercise per Session: 0 min  Stress: No Stress Concern Present (10/28/2021)   [place]    Feeling of Stress : Not at all  Social Connections: Moderately Integrated (11/14/2022)   Social Connection and Isolation Panel [NHANES]    Frequency of Communication with Friends and Family: More than three times a week     Frequency of Social Gatherings with Friends and Family: Once a week  Attends Religious Services: More than 4 times per year    Active Member of Clubs or Organizations: Yes    Attends Archivist Meetings: More than 4 times per year    Marital Status: Divorced  Intimate Partner Violence: Not At Risk (11/14/2022)   Humiliation, Afraid, Rape, and Kick questionnaire    Fear of Current or Ex-Partner: No    Emotionally Abused: No    Physically Abused: No    Sexually Abused: No     Allergies  Allergen Reactions   Carvedilol Other (See Comments)    Sinus pause on telemetry >3 seconds. Longest one 9 sec. No AV nodal agent   Lisinopril Anaphylaxis, Shortness Of Breath and Swelling    Angioedema, required intubation and mechanical ventilation   Chantix [Varenicline] Other (See Comments)    Nightmares and unable to sleep well   Amoxicillin Nausea Only    Did it involve swelling of the face/tongue/throat, SOB, or low BP? No Did it involve sudden or severe rash/hives, skin peeling, or any reaction on the inside of your mouth or nose? No Did you need to seek medical attention at a hospital or doctor's office? No When did it last happen? childhood reaction      If all above answers are "NO", may proceed with cephalosporin use.      Outpatient Medications Prior to Visit  Medication Sig Dispense Refill   acetaminophen (TYLENOL) 500 MG tablet Take 2 tablets (1,000 mg total) by mouth every 8 (eight) hours as needed for mild pain or fever.     albuterol (PROVENTIL) (2.5 MG/3ML) 0.083% nebulizer solution Take 3 mLs (2.5 mg total) by nebulization every 6 (six) hours as needed for wheezing or shortness of breath. 150 mL 1   albuterol (VENTOLIN HFA) 108 (90 Base) MCG/ACT inhaler INHALE 2 PUFFS INTO THE LUNGS EVERY 6 HOURS AS NEEDEDFOR SHORTNESS OF BREATH OR WHEEZING. 18 g 1   amLODipine (NORVASC) 10 MG tablet TAKE 1 TABLET BY MOUTH ONCE A DAY. 90 tablet 1   ascorbic acid (VITAMIN C) 500 MG  tablet Take 1 tablet (500 mg total) by mouth daily. 30 tablet 1   aspirin 81 MG EC tablet Take 1 tablet (81 mg total) by mouth daily. (Patient taking differently: Take 81 mg by mouth in the morning and at bedtime.) 30 tablet    atorvastatin (LIPITOR) 80 MG tablet Take 1 tablet by mouth once daily 90 tablet 0   azelastine (ASTELIN) 0.1 % nasal spray USE 2 SPRAYS IN EACH NOSTRIL TWICE DAILY. 30 mL 11   Blood Glucose Monitoring Suppl (ACCU-CHEK GUIDE ME) w/Device KIT 1 Piece by Does not apply route as directed. 1 kit 0   budesonide-formoterol (SYMBICORT) 160-4.5 MCG/ACT inhaler Take 2 puffs first thing in am and then another 2 puffs about 12 hours later. 1 each 12   Cholecalciferol (VITAMIN D3) 125 MCG (5000 UT) CAPS Take 1 capsule (5,000 Units total) by mouth daily. 90 capsule 0   citalopram (CELEXA) 20 MG tablet Take 1 tablet (20 mg total) by mouth daily. 90 tablet 3   cycloSPORINE (RESTASIS) 0.05 % ophthalmic emulsion Place 1 drop into both eyes 2 (two) times daily.     dapagliflozin propanediol (FARXIGA) 10 MG TABS tablet Take 10 mg by mouth daily.     ferrous sulfate 325 (65 FE) MG tablet Take 1 tablet (325 mg total) by mouth daily with breakfast. 90 tablet 1   furosemide (LASIX) 40 MG tablet Take 1 tablet (40 mg  total) by mouth daily as needed for fluid. 30 tablet 11   glipiZIDE (GLUCOTROL XL) 5 MG 24 hr tablet TAKE 1 TABLET BY MOUTH DAILY WITH BREAKFAST. 90 tablet 0   glucose blood (ACCU-CHEK GUIDE) test strip Use to test glucose 2 times daily. 100 each 2   lactulose (CHRONULAC) 10 GM/15ML solution TAKE 15 MLS BY MOUTH ONCE DAILY. 473 mL 0   magnesium gluconate (MAGONATE) 500 MG tablet Take 500 mg by mouth 2 (two) times daily.     metFORMIN (GLUCOPHAGE) 500 MG tablet TAKE 1 TABLET BY MOUTH TWICE DAILY WITH MEALS. 60 tablet 3   mometasone (ELOCON) 0.1 % cream Apply 1 application topically daily as needed (eczema on ears).     olopatadine (PATANOL) 0.1 % ophthalmic solution Place 1 drop into both  eyes 2 (two) times daily.     pantoprazole (PROTONIX) 40 MG tablet TAKE (1) TABLET BY MOUTH TWICE DAILY BEFORE A MEAL. 99991111 tablet 3   TRULICITY 1.5 0000000 SOPN INJECT 1.5 MG INTO THE SKIN ONCE WEEKLY. 2 mL 0   varenicline (CHANTIX CONTINUING MONTH PAK) 1 MG tablet Take 1 tablet (1 mg total) by mouth daily. 30 tablet 1   varenicline (CHANTIX) 0.5 MG tablet Take 1 tablet (0.5 mg total) by mouth daily. 60 tablet 0   No facility-administered medications prior to visit.       Objective:   Physical Exam:  General appearance: 57 y.o., male, NAD, conversant  Eyes: anicteric sclerae; PERRL, tracking appropriately HENT: NCAT; MMM Neck: Trachea midline; no lymphadenopathy, no JVD Lungs: diminished on right, with normal respiratory effort CV: RRR, no murmur  Abdomen: Soft, non-tender; non-distended, BS present  Extremities: No peripheral edema, warm Skin: Normal turgor and texture; no rash Psych: Appropriate affect Neuro: Alert and oriented to person and place, no focal deficit     There were no vitals filed for this visit.     on RA BMI Readings from Last 3 Encounters:  11/14/22 33.45 kg/m  10/23/22 33.68 kg/m  10/17/22 33.75 kg/m   Wt Readings from Last 3 Encounters:  11/14/22 201 lb (91.2 kg)  10/23/22 202 lb 6 oz (91.8 kg)  10/17/22 202 lb 12.8 oz (92 kg)     CBC    Component Value Date/Time   WBC 8.3 09/29/2022 0836   RBC 5.41 09/29/2022 0836   HGB 16.5 09/29/2022 0836   HGB 16.1 02/17/2022 0811   HCT 46.9 09/29/2022 0836   HCT 48.4 02/17/2022 0811   PLT 180 09/29/2022 0836   PLT 197 02/17/2022 0811   MCV 86.7 09/29/2022 0836   MCV 83 02/17/2022 0811   MCH 30.5 09/29/2022 0836   MCHC 35.2 09/29/2022 0836   RDW 14.0 09/29/2022 0836   RDW 17.1 (H) 02/17/2022 0811   LYMPHSABS 1,635 09/29/2022 0836   LYMPHSABS 1.8 09/13/2021 0955   MONOABS 0.5 09/05/2021 0415   EOSABS 232 09/29/2022 0836   EOSABS 0.2 09/13/2021 0955   BASOSABS 50 09/29/2022 0836    BASOSABS 0.0 09/13/2021 0955      Chest Imaging: 07/18/22 PET/CT reviewed by me with weakly avid RUL 1cm nodule  11/03/22 CXR with resolution pneumothorax  Pulmonary Functions Testing Results:    Latest Ref Rng & Units 08/20/2022   10:50 AM 07/13/2015    8:44 AM  PFT Results  FVC-Pre L 2.92  2.74   FVC-Predicted Pre % 71  64   FVC-Post L 2.70  2.64   FVC-Predicted Post % 66  62  Pre FEV1/FVC % % 75  79   Post FEV1/FCV % % 81  83   FEV1-Pre L 2.19  2.16   FEV1-Predicted Pre % 70  65   FEV1-Post L 2.18  2.20   DLCO uncorrected ml/min/mmHg 15.45  15.58   DLCO UNC% % 64  60   DLVA Predicted % 84  79   TLC L 6.33  4.55   TLC % Predicted % 105  76   RV % Predicted % 185  97    08/2022 PFT with reduced vital capacity in setting of air trapping, mildly reduced diffusing capacity  Echocardiogram 08/05/22:   1. Left ventricular ejection fraction, by estimation, is 50 to 55%. The  left ventricle has low normal function. The left ventricle has no regional  wall motion abnormalities. Left ventricular diastolic parameters are  indeterminate.   2. Right ventricular systolic function is normal. The right ventricular  size is normal.   3. The mitral valve is normal in structure. No evidence of mitral valve  regurgitation. No evidence of mitral stenosis.   4. The aortic valve is tricuspid. There is mild calcification of the  aortic valve. There is mild thickening of the aortic valve. Aortic valve  regurgitation is not visualized. No aortic stenosis is present.   5. The inferior vena cava is normal in size with greater than 50%  respiratory variability, suggesting right atrial pressure of 3 mmHg.      Assessment & Plan:   # Iatrogenic right pneumothorax Symptomatic with R pleuritic pain and mildly increased DOE, slightly larger after use of CPAP overnight, smoking.  # RUL weakly avid nodule s/p nav bronch with fiducial marker placement Highly suspicious for stage I nsclc  # Air  trapping, mildly reduced diffusing capacity # At risk for COPD, possible asthma  # Smoking  Plan: - Recommended admission for chest tube placement for symptomatic R ptx given his underlying lung disease, he declines. Discussed option of needle aspiration in clinic which could improve symptoms and decrease likelihood of recurrence by maybe 50% though 10% chance I make things worse, he declines.  - ED precautions reinforced  - [facility] 10/21/22 at [facility] - stay off CPAP, cigarettes at least until we have discussed next chest x ray - continue symbicort  - follow up tbd pending next x ray   Maryjane Hurter, MD [facility] 11/30/2022 2:14 PM

## 2022-12-01 ENCOUNTER — Other Ambulatory Visit: Payer: Self-pay | Admitting: Radiation Oncology

## 2022-12-01 ENCOUNTER — Ambulatory Visit (INDEPENDENT_AMBULATORY_CARE_PROVIDER_SITE_OTHER): Payer: 59 | Admitting: Student

## 2022-12-01 ENCOUNTER — Encounter: Payer: Self-pay | Admitting: Student

## 2022-12-01 VITALS — BP 112/74 | HR 69 | Temp 98.0°F | Ht 65.0 in | Wt 198.0 lb

## 2022-12-01 DIAGNOSIS — R490 Dysphonia: Secondary | ICD-10-CM

## 2022-12-01 DIAGNOSIS — C3411 Malignant neoplasm of upper lobe, right bronchus or lung: Secondary | ICD-10-CM

## 2022-12-01 DIAGNOSIS — C3491 Malignant neoplasm of unspecified part of right bronchus or lung: Secondary | ICD-10-CM | POA: Diagnosis not present

## 2022-12-01 NOTE — Progress Notes (Signed)
  Radiation Oncology         [phone] ________________________________  Name: Randy Hebert MRN: [id number]  Date: 11/19/2022  DOB: 1965/09/04  End of Treatment Note  Diagnosis:    Stage IA1, cT1aN0M0, NSCLC, squamous cell carcinoma of the RUL.   Indication for treatment:  Curative       Radiation treatment dates:   11/14/22-11/21/22  Site/dose:   The tumor in the RUL was treated with a course of stereotactic body radiation treatment. The patient received 54 Gy In 3 fractions at 18 G per fraction.  Narrative: The patient tolerated radiation treatment relatively well.   The patient did not have any signs of acute toxicity during treatment.  Plan: The patient will receive a call in about one month from the radiation oncology department. He will continue follow up in our clinic with repeat CT chest in 6-8 weeks, then if stable every 6 months per NCCN guidelines.      Carola Rhine, PAC

## 2022-12-01 NOTE — Patient Instructions (Signed)
-   CT chest in August - Referral to ENT placed today for spot on your uvula - continue symbicort 2 puff twice daily, rinse mouth after use - ok to continue cpap at night - try chantix - even once daily almost as effective as taking twice daily, try to decrease amount you're smoking by the end of the first week you're on it

## 2022-12-02 ENCOUNTER — Ambulatory Visit: Payer: 59 | Admitting: "Endocrinology

## 2022-12-18 ENCOUNTER — Ambulatory Visit: Payer: 59 | Attending: Student | Admitting: Student

## 2022-12-18 ENCOUNTER — Encounter: Payer: Self-pay | Admitting: Student

## 2022-12-18 VITALS — BP 124/60 | HR 88 | Ht 65.0 in | Wt 199.0 lb

## 2022-12-18 DIAGNOSIS — I10 Essential (primary) hypertension: Secondary | ICD-10-CM | POA: Diagnosis not present

## 2022-12-18 DIAGNOSIS — G4733 Obstructive sleep apnea (adult) (pediatric): Secondary | ICD-10-CM

## 2022-12-18 DIAGNOSIS — E782 Mixed hyperlipidemia: Secondary | ICD-10-CM

## 2022-12-18 DIAGNOSIS — I2510 Atherosclerotic heart disease of native coronary artery without angina pectoris: Secondary | ICD-10-CM | POA: Diagnosis not present

## 2022-12-18 DIAGNOSIS — I5032 Chronic diastolic (congestive) heart failure: Secondary | ICD-10-CM

## 2022-12-18 NOTE — Progress Notes (Signed)
Cardiology Office Note    Date:  12/18/2022  ID:  Randy Hebert, DOB 08/10/1966, MRN [id number] Cardiologist: Dina Rich, MD    History of Present Illness:    Randy Hebert is a 57 y.o. 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  He was examined by Dr. Wyline Mood in 07/2022 and reported occasional episodes of feeling nauseated and flushed at times and did have baseline dyspnea on exertion but no progression of this. A Lexiscan Myoview was recommended for further evaluation and this showed evidence of prior infarct with no current ischemia. His EF was read at 34% by NST but an echocardiogram was obtained and showed his EF was actually 50 to 55%.  In the interim, he was diagnosed with RUL Stage I non-small cell lung cancer and underwent radiation.  In talking with the patient today, he reports overall doing well since undergoing radiation earlier this year. He did notice fatigue but denies any acute changes in his respiratory status. No recent dyspnea on exertion, orthopnea, PND or pitting edema. He reports occasionally "feeling warm all over" as discussed during his prior office visit but this typically occurs at rest and he is unaware of any specific triggers. No specific chest pain. He does report a history of significant arthritis along his neck and has neuropathy with this as well.   Studies Reviewed:   EKG: EKG is not ordered today.  NST: 07/2022   Findings are consistent with prior anterolatera/latera/inferolateral myocardial infarction. There is no current ischemia.  The study is high risk based on decreased LVEF alone,consider correlating with echo.   No ST deviation was noted.   LV perfusion is  abnormal. Large anterolateral/lateral/inferolateral l defect moderate to severe intensity that is fixed.   Left ventricular function is abnormal. Nuclear stress EF: 34 %. The left ventricular ejection fraction is moderately decreased (30-44%). End diastolic cavity size is mildly enlarged.  Echocardiogram: 08/2022 IMPRESSIONS     1. Left ventricular ejection fraction, by estimation, is 50 to 55%. The  left ventricle has low normal function. The left ventricle has no regional  wall motion abnormalities. Left ventricular diastolic parameters are  indeterminate.   2. Right ventricular systolic function is normal. The right ventricular  size is normal.   3. The mitral valve is normal in structure. No evidence of mitral valve  regurgitation. No evidence of mitral stenosis.   4. The aortic valve is tricuspid. There is mild calcification of the  aortic valve. There is mild thickening of the aortic valve. Aortic valve  regurgitation is not visualized. No aortic stenosis is present.   5. The inferior vena cava is normal in size with greater than 50%  respiratory variability, suggesting right atrial pressure of 3 mmHg.    Physical Exam:   VS:  BP 124/60   Pulse 88   Ht  (1.651 m)   Wt 199 lb (90.3 kg)   SpO2 94%   BMI 33.12 kg/m    Wt Readings from Last 3 Encounters:  12/18/22 199 lb (90.3 kg)  12/01/22 198 lb (89.8 kg)  11/14/22 201 lb (91.2 kg)     GEN: Pleasant male appearing in no acute distress NECK: No  JVD; No carotid bruits CARDIAC: RRR, no murmurs, rubs, gallops RESPIRATORY:  Clear to auscultation without rales, wheezing or rhonchi  ABDOMEN: Appears non-distended. No obvious abdominal masses. EXTREMITIES: No clubbing or cyanosis. No pitting edema.  Distal pedal pulses are 2+ bilaterally.   Assessment and Plan:   1. HFimpEF - His EF was at 40% in 2016, at 45-50% in 05/2021 and 50-55% in 08/2022. He appears euvolemic by examination today. - Medical therapy for his  cardiomyopathy has been limited as he is not on a beta-blocker due to a history of sinus pauses and bradycardia and previously had severe angioedema with ACE inhibitor, therefore not on an ARB due to risk of cross-reactivity. Remains on Farxiga 10 mg daily and does have an Rx for as needed Lasix but does not have to utilize this routinely.  2. CAD  - He is s/p STEMI in 01/2015 with DES to D1 and recent NST in 07/2022 showed no current ischemia. He denies any recent anginal symptoms. - Continue ASA 81 mg daily and Atorvastatin 80 mg daily. He is not on beta-blocker therapy due to prior significant bradycardia.  3. HTN - His BP is well-controlled at 124/60 during today's visit. Continue current medical therapy with Amlodipine 10 mg daily.  4. HLD - FLP in 01/2022 showed total cholesterol 137, triglycerides 147, HDL 25 and LDL 86. He has been continued on Atorvastatin 80 mg daily. If LDL remains above goal, would recommend the addition of Zetia.  5. OSA - Continued compliance with CPAP encouraged.   Signed, Ellsworth Lennox, PA-C

## 2022-12-18 NOTE — Patient Instructions (Signed)
Medication Instructions:   Continue current cardiac medications.   *If you need a refill on your cardiac medications before your next appointment, please call your pharmacy*  Follow-Up: At [facility], you and your health needs are our priority.  As part of our continuing mission to provide you with exceptional heart care, we have created designated Provider Care Teams.  These Care Teams include your primary Cardiologist (physician) and Advanced Practice Providers (APPs -  Physician Assistants and Nurse Practitioners) who all work together to provide you with the care you need, when you need it.  We recommend signing up for the patient portal called "MyChart".  Sign up information is provided on this After Visit Summary.  MyChart is used to connect with patients for Virtual Visits (Telemedicine).  Patients are able to view lab/test results, encounter notes, upcoming appointments, etc.  Non-urgent messages can be sent to your provider as well.   To learn more about what you can do with MyChart, go to [email or web address].    Your next appointment:   6 month(s)  Provider:   You may see Dina Rich, MD or one of the following Advanced Practice Providers on your designated Care Team:   [place], PA-C  Jacolyn Reedy, [place]

## 2022-12-22 ENCOUNTER — Other Ambulatory Visit: Payer: Self-pay | Admitting: "Endocrinology

## 2022-12-22 DIAGNOSIS — E1159 Type 2 diabetes mellitus with other circulatory complications: Secondary | ICD-10-CM | POA: Diagnosis not present

## 2022-12-23 LAB — COMPREHENSIVE METABOLIC PANEL
ALT: 47 IU/L — ABNORMAL HIGH (ref 0–44)
AST: 43 IU/L — ABNORMAL HIGH (ref 0–40)
Albumin/Globulin Ratio: 1.5 (ref 1.2–2.2)
Albumin: 4.6 g/dL (ref 3.8–4.9)
Alkaline Phosphatase: 115 IU/L (ref 44–121)
BUN/Creatinine Ratio: 14 (ref 9–20)
BUN: 10 mg/dL (ref 6–24)
Bilirubin Total: 1.2 mg/dL (ref 0.0–1.2)
CO2: 21 mmol/L (ref 20–29)
Calcium: 10 mg/dL (ref 8.7–10.2)
Chloride: 100 mmol/L (ref 96–106)
Creatinine, Ser: 0.72 mg/dL — ABNORMAL LOW (ref 0.76–1.27)
Globulin, Total: 3 g/dL (ref 1.5–4.5)
Glucose: 133 mg/dL — ABNORMAL HIGH (ref 70–99)
Potassium: 4.6 mmol/L (ref 3.5–5.2)
Sodium: 136 mmol/L (ref 134–144)
Total Protein: 7.6 g/dL (ref 6.0–8.5)
eGFR: 106 mL/min/{1.73_m2} (ref >59)

## 2022-12-25 ENCOUNTER — Ambulatory Visit (INDEPENDENT_AMBULATORY_CARE_PROVIDER_SITE_OTHER): Payer: 59 | Admitting: Family Medicine

## 2022-12-25 ENCOUNTER — Encounter: Payer: Self-pay | Admitting: Family Medicine

## 2022-12-25 DIAGNOSIS — C3411 Malignant neoplasm of upper lobe, right bronchus or lung: Secondary | ICD-10-CM

## 2022-12-25 DIAGNOSIS — I10 Essential (primary) hypertension: Secondary | ICD-10-CM

## 2022-12-25 DIAGNOSIS — Z720 Tobacco use: Secondary | ICD-10-CM | POA: Insufficient documentation

## 2022-12-25 DIAGNOSIS — E782 Mixed hyperlipidemia: Secondary | ICD-10-CM

## 2022-12-25 DIAGNOSIS — Z7984 Long term (current) use of oral hypoglycemic drugs: Secondary | ICD-10-CM

## 2022-12-25 DIAGNOSIS — E1159 Type 2 diabetes mellitus with other circulatory complications: Secondary | ICD-10-CM

## 2022-12-25 MED ORDER — BUPROPION HCL ER (SR) 150 MG PO TB12
150.0000 mg | ORAL_TABLET | Freq: Two times a day (BID) | ORAL | 0 refills | Status: DC
Start: 2022-12-25 — End: 2023-10-27

## 2022-12-25 NOTE — Assessment & Plan Note (Signed)
Lipid today.  Continue atorvastatin.  If not at goal we will need addition of Zetia.

## 2022-12-25 NOTE — Progress Notes (Addendum)
Subjective:  Patient ID: Randy Hebert, male    DOB: Dec 25, 1965  Age: 57 y.o. MRN: [id number]  CC: Chief Complaint  Patient presents with   Hypertension    HPI:  57 year old male with an extensive past medical history as outlined below with a recent diagnosis of non-small cell lung cancer on radiation treatment presents for follow-up.  Patient continues to smoke.  He did not tolerate Chantix.  Will discuss Wellbutrin today.  Has recently seen by cardiology.  From a cardiology perspective he is essentially stable.  He does need reassessment of his lipids.  Last LDL was 86.  Goal less than 70 but probably better suited for less than 55 given his extensive comorbidities.  Hypertension is stable.  Patient is in need of an A1c.  Last A1c was 6.5 in June of last year.  He is currently on Farxiga, glipizide, metformin, and Trulicity.   Patient Active Problem List   Diagnosis Date Noted   Tobacco abuse 12/25/2022   Malignant neoplasm of upper lobe of right lung 09/25/2022   Osteoarthritis of both hands 05/19/2022   Cervical radiculopathy 02/21/2022   History of GI bleed 11/13/2021   Insomnia 09/13/2021   Seasonal and perennial allergic rhinitis 11/10/2018   Cirrhosis of liver 11/08/2018   Barrett's esophagus 08/04/2018   Vitamin D deficiency 06/30/2018   Alcohol abuse 06/30/2018   DM type 2 causing vascular disease 08/17/2017   Chronic combined systolic and diastolic CHF (congestive heart failure) 05/15/2017   OSA (obstructive sleep apnea) 04/28/2017   Depression with anxiety 04/15/2017   GERD (gastroesophageal reflux disease) 09/23/2016   Asthmatic bronchitis , chronic 08/27/2015   Mixed hyperlipidemia 08/27/2015   Class 1 obesity due to excess calories with serious comorbidity and body mass index (BMI) of 32.0 to 32.9 in adult 08/27/2015   CAD (coronary artery disease) 05/31/2015   Current smoker 05/31/2015   Essential hypertension, benign 02/09/2015    Social Hx    Social History   Socioeconomic History   Marital status: Divorced    Spouse name: alice   Number of children: 3   Years of education: 12   Highest education level: Not on file  Occupational History   Occupation: SSI  Tobacco Use   Smoking status: Every Day    Packs/day: 1.00    Years: 46.00    Additional pack years: 0.00    Total pack years: 46.00    Types: Cigarettes    Passive exposure: Never   Smokeless tobacco: Former    Types: Chew   Tobacco comments:    Smokes 1 pack a day of cigarettes   Vaping Use   Vaping Use: Former  Substance and Sexual Activity   Alcohol use: Yes    Comment: rare beer use   Drug use: No    Comment: history of drug use- marijuana, cocaine- quit 2014   Sexual activity: Yes    Partners: Female    Birth control/protection: None  Other Topics Concern   Not on file  Social History Narrative   Lives with girlfriend [name]   3 children, but 1 OD  In 2019.   Grandchildren-2      Two cats: may and princess; and a dog-foxy      Enjoy: spending time with pets, TV, yard work       Diet: eats all food groups   Caffeine: half and half coffee, some tea-half and half decaf   Water: 6-8 cups daily  Wears seat belt   Does not use phone while driving   Smoke detectors at home      Social Determinants of Health   Financial Resource Strain: Low Risk  (11/14/2022)   Overall Financial Resource Strain (CARDIA)    Difficulty of Paying Living Expenses: Not hard at all  Food Insecurity: No Food Insecurity (11/14/2022)   Hunger Vital Sign    Worried About Running Out of Food in the Last Year: Never true    Ran Out of Food in the Last Year: Never true  Transportation Needs: No Transportation Needs (11/14/2022)   PRAPARE - Administrator, Civil Service (Medical): No    Lack of Transportation (Non-Medical): No  Physical Activity: Inactive (11/14/2022)   Exercise Vital Sign    Days of Exercise per Week: 0 days    Minutes of Exercise per  Session: 0 min  Stress: No Stress Concern Present (10/28/2021)   [employer] of Occupational Health - Occupational Stress Questionnaire    Feeling of Stress : Not at all  Social Connections: Moderately Integrated (11/14/2022)   Social Connection and Isolation Panel [NHANES]    Frequency of Communication with Friends and Family: More than three times a week    Frequency of Social Gatherings with Friends and Family: Once a week    Attends Religious Services: More than 4 times per year    Active Member of [employer] or Organizations: Yes    Attends Engineer, structural: More than 4 times per year    Marital Status: Divorced    Review of Systems Per HPI  Objective:  BP 118/74   Pulse 82   Temp 98.4 F (36.9 C)   Ht 5\' 5"  (1.651 m)   Wt 201 lb (91.2 kg)   SpO2 96%   BMI 33.45 kg/m      12/25/2022    9:05 AM 12/18/2022    2:41 PM 12/01/2022    8:23 AM  BP/Weight  Systolic BP 118 124 112  Diastolic BP 74 60 74  Wt. (Lbs) 201 199 198  BMI 33.45 kg/m2 33.12 kg/m2 32.95 kg/m2    Physical Exam Vitals and nursing note reviewed.  Constitutional:      Appearance: Normal appearance. He is obese.  HENT:     Head: Normocephalic and atraumatic.  Cardiovascular:     Rate and Rhythm: Normal rate and regular rhythm.  Pulmonary:     Effort: Pulmonary effort is normal. No respiratory distress.     Breath sounds: No wheezing or rales.  Neurological:     Mental Status: He is alert.  Psychiatric:        Mood and Affect: Mood normal.        Behavior: Behavior normal.     Lab Results  Component Value Date   WBC 8.3 09/29/2022   HGB 16.5 09/29/2022   HCT 46.9 09/29/2022   PLT 180 09/29/2022   GLUCOSE 133 (H) 12/22/2022   CHOL 137 02/17/2022   TRIG 147 02/17/2022   HDL 25 (L) 02/17/2022   LDLCALC 86 02/17/2022   ALT 47 (H) 12/22/2022   AST 43 (H) 12/22/2022   NA 136 12/22/2022   K 4.6 12/22/2022   CL 100 12/22/2022   CREATININE 0.72 (L) 12/22/2022   BUN 10 12/22/2022    CO2 21 12/22/2022   TSH 3.61 06/21/2020   PSA 0.23 06/21/2020   INR 1.1 09/29/2022   HGBA1C 6.7 06/23/2022   MICROALBUR 1.8 11/09/2019  Assessment & Plan:   Problem List Items Addressed This Visit       Cardiovascular and Mediastinum   DM type 2 causing vascular disease    I went today to reassess.  Has been stable.  Continue current medications.      Relevant Orders   Hemoglobin A1c   Microalbumin / creatinine urine ratio   Essential hypertension, benign    Currently stable on amlodipine, Lasix.  Continue.        Respiratory   Malignant neoplasm of upper lobe of right lung    Patient getting radiation treatments.  Needs to quit smoking.  Starting Wellbutrin.        Other   Mixed hyperlipidemia    Lipid today.  Continue atorvastatin.  If not at goal we will need addition of Zetia.      Relevant Orders   Lipid panel   Tobacco abuse    Starting Wellbutrin.       Meds ordered this encounter  Medications   buPROPion (WELLBUTRIN SR) 150 MG 12 hr tablet    Sig: Take 1 tablet (150 mg total) by mouth 2 (two) times daily.    Dispense:  180 tablet    Refill:  0    Follow-up:  Return in about 6 months (around 06/26/2023).  Everlene Other DO [facility]

## 2022-12-25 NOTE — Assessment & Plan Note (Signed)
Starting Wellbutrin. 

## 2022-12-25 NOTE — Assessment & Plan Note (Signed)
Currently stable on amlodipine, Lasix.  Continue.

## 2022-12-25 NOTE — Patient Instructions (Signed)
Stop the Chantix.  Start Wellbutrin.  Labs today.  Follow up in 6 months or sooner if needed.  Please quit smoking.

## 2022-12-25 NOTE — Assessment & Plan Note (Signed)
I went today to reassess.  Has been stable.  Continue current medications.

## 2022-12-25 NOTE — Assessment & Plan Note (Signed)
Patient getting radiation treatments.  Needs to quit smoking.  Starting Wellbutrin.

## 2022-12-26 ENCOUNTER — Ambulatory Visit (INDEPENDENT_AMBULATORY_CARE_PROVIDER_SITE_OTHER): Payer: 59 | Admitting: "Endocrinology

## 2022-12-26 ENCOUNTER — Telehealth: Payer: Self-pay

## 2022-12-26 ENCOUNTER — Other Ambulatory Visit: Payer: Self-pay | Admitting: Family Medicine

## 2022-12-26 ENCOUNTER — Encounter: Payer: Self-pay | Admitting: "Endocrinology

## 2022-12-26 VITALS — BP 126/74 | HR 88 | Ht 65.0 in | Wt 202.4 lb

## 2022-12-26 DIAGNOSIS — J4489 Other specified chronic obstructive pulmonary disease: Secondary | ICD-10-CM

## 2022-12-26 DIAGNOSIS — E1159 Type 2 diabetes mellitus with other circulatory complications: Secondary | ICD-10-CM

## 2022-12-26 DIAGNOSIS — E6609 Other obesity due to excess calories: Secondary | ICD-10-CM

## 2022-12-26 DIAGNOSIS — F17200 Nicotine dependence, unspecified, uncomplicated: Secondary | ICD-10-CM

## 2022-12-26 DIAGNOSIS — I10 Essential (primary) hypertension: Secondary | ICD-10-CM | POA: Diagnosis not present

## 2022-12-26 DIAGNOSIS — Z6832 Body mass index (BMI) 32.0-32.9, adult: Secondary | ICD-10-CM

## 2022-12-26 DIAGNOSIS — E782 Mixed hyperlipidemia: Secondary | ICD-10-CM | POA: Diagnosis not present

## 2022-12-26 LAB — LIPID PANEL
Chol/HDL Ratio: 4.9 ratio (ref 0.0–5.0)
Cholesterol, Total: 197 mg/dL (ref 100–199)
HDL: 40 mg/dL (ref >39)
LDL Chol Calc (NIH): 103 mg/dL — ABNORMAL HIGH (ref 0–99)
Triglycerides: 319 mg/dL — ABNORMAL HIGH (ref 0–149)
VLDL Cholesterol Cal: 54 mg/dL — ABNORMAL HIGH (ref 5–40)

## 2022-12-26 LAB — MICROALBUMIN / CREATININE URINE RATIO
Creatinine, Urine: 34.1 mg/dL
Microalb/Creat Ratio: 12 mg/g creat (ref 0–29)
Microalbumin, Urine: 4 ug/mL

## 2022-12-26 LAB — HEMOGLOBIN A1C
Est. average glucose Bld gHb Est-mCnc: 146 mg/dL
Hgb A1c MFr Bld: 6.7 % — ABNORMAL HIGH (ref 4.8–5.6)

## 2022-12-26 MED ORDER — ALBUTEROL SULFATE (2.5 MG/3ML) 0.083% IN NEBU
2.5000 mg | INHALATION_SOLUTION | Freq: Four times a day (QID) | RESPIRATORY_TRACT | 1 refills | Status: DC | PRN
Start: 2022-12-26 — End: 2024-03-02

## 2022-12-26 NOTE — Progress Notes (Signed)
12/26/2022, 4:58 PM      Endocrinology follow-up note    Subjective:    Patient ID: Randy Hebert, male    DOB: [birth year].  [name] is being seen in  follow-up for management of currently uncontrolled symptomatic type 2 diabetes, hyperlipidemia, hypertension. PMD:  Tommie Sams, DO.   Past Medical History:  Diagnosis Date   ACE inhibitor-aggravated angioedema    Allergy    Anemia    Angio-edema    Anxiety    Arthritis    Asthma    hip replacement   Back pain    Bradycardia 04/28/2017   Bulging of cervical intervertebral disc    CAD (coronary artery disease)    lateral STEMI 02/06/2015 00% D1 occlusion treated with Promus Premier 2.5 mm x 16 mm DES, 70% ramus stenosis, 40% mid RCA stenosis, 45% distal RCA stenosis, EF 45-50%   CHF (congestive heart failure) (HCC)    COPD (chronic obstructive pulmonary disease) (HCC)    Depression    Diabetes mellitus without complication (HCC)    Difficult intubation    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   Dry eye    Dyspnea    Early satiety 09/23/2016   Fatty liver    GERD (gastroesophageal reflux disease)    HCAP (healthcare-associated pneumonia) 05/15/2017   Headache    Heart murmur    Hip pain    Hyperlipidemia    Hypertension    Lobar pneumonia (HCC) 05/15/2017   Melena 08/04/2018   MI (myocardial infarction) [facility])    Myocardial infarction (HCC)    Neck pain    Non-ST elevation (NSTEMI) myocardial infarction (HCC) 04/27/2017   NSTEMI (non-ST elevated myocardial infarction) (HCC) 04/26/2017   Otitis media    Pleurisy    Pneumonia due to COVID-19 virus 10/07/2019   Rectal bleeding 11/08/2018   Right shoulder pain 03/20/2020   Sinus pause    9 sec sinus pause on telemetry after started on coreg after MI, avoid AV nodal blocking agent   Sleep apnea    uses a cpap   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   STEMI (ST elevation myocardial infarction)  (HCC) 05/31/2015   Substance abuse (HCC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   Syncope 06/29/2017   Syncope and collapse 05/15/2017   Transaminitis 08/04/2018   Unilateral primary osteoarthritis, right hip 07/01/2016   Past Surgical History:  Procedure Laterality Date   BIOPSY  10/09/2016   Procedure: BIOPSY;  Surgeon: Corbin Ade, MD;  Location: [facility];  Service: Endoscopy;;   BIOPSY  11/28/2019   Procedure: BIOPSY;  Surgeon: Corbin Ade, MD;  Location: [facility];  Service: Endoscopy;;   BRONCHIAL BIOPSY  10/16/2022   Procedure: BRONCHIAL BIOPSIES;  Surgeon: Omar Person, MD;  Location: [facility] ENDOSCOPY;  Service: Pulmonary;;   BRONCHIAL NEEDLE ASPIRATION BIOPSY  10/16/2022   Procedure: BRONCHIAL NEEDLE ASPIRATION BIOPSIES;  Surgeon: Omar Person, MD;  Location: [facility] ENDOSCOPY;  Service: Pulmonary;;   BRONCHIAL WASHINGS  10/16/2022   Procedure: BRONCHIAL WASHINGS;  Surgeon: Omar Person, MD;  Location: [facility] ENDOSCOPY;  Service: Pulmonary;;   CARDIAC CATHETERIZATION N/A 02/06/2015   Procedure: Left Heart Cath and Coronary Angiography;  Surgeon: Marykay Lex, MD;  Location: [facility] INVASIVE CV LAB;  Service: Cardiovascular;  Laterality: N/A;   CARDIAC CATHETERIZATION N/A 02/06/2015   Procedure: Coronary Stent Intervention;  Surgeon: Marykay Lex, MD;  Location: [facility] INVASIVE CV LAB;  Service: Cardiovascular;  Laterality: N/A;   COLONOSCOPY WITH PROPOFOL N/A 10/09/2016   Sigmoid and descending colon diverticulosis, four 4-6 mm polyps in sigmoid, one 4 mm polyp in descending. Tubular adenomas and hyperplastic. 5 year surveillance.    COLONOSCOPY WITH PROPOFOL N/A 11/24/2016   Sigmoid and descending colon diverticulosis, four 4-6 mm polyps in sigmoid, one 4 mm polyp in descending. Tubular adenomas and hyperplastic. 5 year surveillance.    COLONOSCOPY WITH PROPOFOL N/A 10/18/2021   Carver: nonbleeding internal hemorrhoids, small and large mouth diverticula found in the sigmoid, descending,  transverse colon.  A 9 mm sessile polyp was found in the ascending colon that was removed.  4 sessile polyps found in the sigmoid, descending, transverse colon 3 to 5 mm in size, examining otherwise.  Path revealed tubular adenomas.  Repeat due in 5 years for surveillance.   CORONARY ANGIOPLASTY WITH STENT PLACEMENT  01/2015   CORONARY STENT INTERVENTION N/A 04/27/2017   Procedure: CORONARY STENT INTERVENTION;  Surgeon: Yvonne Kendall, MD;  Location: [facility] INVASIVE CV LAB;  Service: Cardiovascular;  Laterality: N/A;   ELECTROPHYSIOLOGY STUDY N/A 06/29/2017   Procedure: ELECTROPHYSIOLOGY STUDY;  Surgeon: Marinus Maw, MD;  Location: [facility] INVASIVE CV LAB;  Service: Cardiovascular;  Laterality: N/A;   ESOPHAGOGASTRODUODENOSCOPY (EGD) WITH PROPOFOL N/A 10/09/2016   Dr. Jena Gauss: LA grade a esophagitis.  Barrett's esophagus, biopsy-proven.  Small hiatal hernia.  EGD February 2019   ESOPHAGOGASTRODUODENOSCOPY (EGD) WITH PROPOFOL N/A 11/28/2019    salmon-colored esophageal mucosa (Barrett's) small hiatal hernia, portal hypertensive gastropathy, normal duodenum, 3 year surveillance   ESOPHAGOGASTRODUODENOSCOPY (EGD) WITH PROPOFOL N/A 09/06/2021   three columns of grade 1 varices in distal esophagus, no stigmata of bleeding or red wale signs. Small hiatal hernia. Mild portal gastropathy. Normal duodenum. Repeat upper endoscopy in 1 year for surveillance.   FIDUCIAL MARKER PLACEMENT  10/16/2022   Procedure: FIDUCIAL MARKER PLACEMENT;  Surgeon: Omar Person, MD;  Location: [facility] ENDOSCOPY;  Service: Pulmonary;;   INCISION / DRAINAGE HAND / FINGER     LEFT HEART CATH AND CORONARY ANGIOGRAPHY N/A 04/27/2017   Procedure: LEFT HEART CATH AND CORONARY ANGIOGRAPHY;  Surgeon: Yvonne Kendall, MD;  Location: [facility] INVASIVE CV LAB;  Service: Cardiovascular;  Laterality: N/A;   LOOP RECORDER INSERTION  06/29/2017   Procedure: Loop Recorder Insertion;  Surgeon: Marinus Maw, MD;  Location: [facility] INVASIVE CV LAB;  Service:  Cardiovascular;;   POLYPECTOMY  11/24/2016   Procedure: POLYPECTOMY;  Surgeon: Corbin Ade, MD;  [facility];  Service: Endoscopy;;  descending and sigmoid   POLYPECTOMY  10/18/2021   Procedure: POLYPECTOMY;  Surgeon: Lanelle Bal, DO;  [facility]: [facility];  Service: Endoscopy;;   TOTAL HIP ARTHROPLASTY Right 07/01/2016   TOTAL HIP ARTHROPLASTY Right 07/01/2016   Procedure: RIGHT TOTAL HIP ARTHROPLASTY ANTERIOR APPROACH;  Surgeon: Kathryne Hitch, MD;  Location: [facility] OR;  Service: Orthopedics;  Laterality: Right;   Social History   Socioeconomic History   Marital status: Divorced    Spouse name: alice   Number of children: 3   Years of education: 12   Highest education level: Not on file  Occupational History   Occupation: SSI  Tobacco Use   Smoking status: Every Day    Packs/day: 1.00    Years: 46.00    Additional pack years: 0.00    Total pack years: 46.00    Types: Cigarettes    Passive exposure: Never   Smokeless  tobacco: Former    Types: Chew   Tobacco comments:    Smokes 1 pack a day of cigarettes   Vaping Use   Vaping Use: Former  Substance and Sexual Activity   Alcohol use: Yes    Comment: rare beer use   Drug use: No    Comment: history of drug use- marijuana, cocaine- quit 2014   Sexual activity: Yes    Partners: Female    Birth control/protection: None  Other Topics Concern   Not on file  Social History Narrative   Lives with girlfriend [name]   3 children, but 1 OD  In 2019.   Grandchildren-2      Two cats: may and princess; and a dog-foxy      Enjoy: spending time with pets, TV, yard work       Diet: eats all food groups   Caffeine: half and half coffee, some tea-half and half decaf   Water: 6-8 cups daily      Wears seat belt   Does not use phone while driving   Smoke detectors at home      Social Determinants of Health   Financial Resource Strain: Low Risk  (11/14/2022)   Overall Financial Resource Strain  (CARDIA)    Difficulty of Paying Living Expenses: Not hard at all  Food Insecurity: No Food Insecurity (11/14/2022)   Hunger Vital Sign    Worried About Running Out of Food in the Last Year: Never true    Ran Out of Food in the Last Year: Never true  Transportation Needs: No Transportation Needs (11/14/2022)   PRAPARE - Administrator, Civil Service (Medical): No    Lack of Transportation (Non-Medical): No  Physical Activity: Inactive (11/14/2022)   Exercise Vital Sign    Days of Exercise per Week: 0 days    Minutes of Exercise per Session: 0 min  Stress: No Stress Concern Present (10/28/2021)   [employer] of Occupational Health - Occupational Stress Questionnaire    Feeling of Stress : Not at all  Social Connections: Moderately Integrated (11/14/2022)   Social Connection and Isolation Panel [NHANES]    Frequency of Communication with Friends and Family: More than three times a week    Frequency of Social Gatherings with Friends and Family: Once a week    Attends Religious Services: More than 4 times per year    Active Member of [employer] or Organizations: Yes    Attends Banker Meetings: More than 4 times per year    Marital Status: Divorced   Outpatient Encounter Medications as of 12/26/2022  Medication Sig   acetaminophen (TYLENOL) 500 MG tablet Take 2 tablets (1,000 mg total) by mouth every 8 (eight) hours as needed for mild pain or fever.   albuterol (PROVENTIL) (2.5 MG/3ML) 0.083% nebulizer solution Take 3 mLs (2.5 mg total) by nebulization every 6 (six) hours as needed for wheezing or shortness of breath.   albuterol (VENTOLIN HFA) 108 (90 Base) MCG/ACT inhaler INHALE 2 PUFFS INTO THE LUNGS EVERY 6 HOURS AS NEEDEDFOR SHORTNESS OF BREATH OR WHEEZING.   amLODipine (NORVASC) 10 MG tablet TAKE 1 TABLET BY MOUTH ONCE A DAY.   ascorbic acid (VITAMIN C) 500 MG tablet Take 1 tablet (500 mg total) by mouth daily.   aspirin 81 MG EC tablet Take 1 tablet (81 mg  total) by mouth daily. (Patient taking differently: Take 81 mg by mouth in the morning and at bedtime.)   atorvastatin (LIPITOR) 80  MG tablet Take 1 tablet by mouth once daily   azelastine (ASTELIN) 0.1 % nasal spray USE 2 SPRAYS IN EACH NOSTRIL TWICE DAILY.   Blood Glucose Monitoring Suppl (ACCU-CHEK GUIDE ME) w/Device KIT 1 Piece by Does not apply route as directed.   budesonide-formoterol (SYMBICORT) 160-4.5 MCG/ACT inhaler Take 2 puffs first thing in am and then another 2 puffs about 12 hours later.   buPROPion (WELLBUTRIN SR) 150 MG 12 hr tablet Take 1 tablet (150 mg total) by mouth 2 (two) times daily.   Cholecalciferol (VITAMIN D3) 125 MCG (5000 UT) CAPS Take 1 capsule (5,000 Units total) by mouth daily.   citalopram (CELEXA) 20 MG tablet Take 1 tablet (20 mg total) by mouth daily.   cycloSPORINE (RESTASIS) 0.05 % ophthalmic emulsion Place 1 drop into both eyes 2 (two) times daily.   dapagliflozin propanediol (FARXIGA) 10 MG TABS tablet Take 10 mg by mouth daily.   ferrous sulfate 325 (65 FE) MG tablet Take 1 tablet (325 mg total) by mouth daily with breakfast.   furosemide (LASIX) 40 MG tablet Take 1 tablet (40 mg total) by mouth daily as needed for fluid.   glipiZIDE (GLUCOTROL XL) 5 MG 24 hr tablet TAKE 1 TABLET BY MOUTH DAILY WITH BREAKFAST.   glucose blood (ACCU-CHEK GUIDE) test strip Use to test glucose 2 times daily.   lactulose (CHRONULAC) 10 GM/15ML solution TAKE 15 MLS BY MOUTH ONCE DAILY.   magnesium gluconate (MAGONATE) 500 MG tablet Take 500 mg by mouth 2 (two) times daily.   metFORMIN (GLUCOPHAGE) 500 MG tablet TAKE 1 TABLET BY MOUTH TWICE DAILY WITH MEALS.   mometasone (ELOCON) 0.1 % cream Apply 1 application topically daily as needed (eczema on ears).   olopatadine (PATANOL) 0.1 % ophthalmic solution Place 1 drop into both eyes 2 (two) times daily.   pantoprazole (PROTONIX) 40 MG tablet TAKE (1) TABLET BY MOUTH TWICE DAILY BEFORE A MEAL.   TRULICITY 1.5 MG/0.5ML SOPN INJECT  1.5 MG INTO THE SKIN ONCE WEEKLY.   No facility-administered encounter medications on file as of 12/26/2022.    ALLERGIES: Allergies  Allergen Reactions   Carvedilol Other (See Comments)    Sinus pause on telemetry >3 seconds. Longest one 9 sec. No AV nodal agent   Lisinopril Anaphylaxis, Shortness Of Breath and Swelling    Angioedema, required intubation and mechanical ventilation   Chantix [Varenicline] Other (See Comments)    Nightmares and unable to sleep well   Amoxicillin Nausea Only    Did it involve swelling of the face/tongue/throat, SOB, or low BP? No Did it involve sudden or severe rash/hives, skin peeling, or any reaction on the inside of your mouth or nose? No Did you need to seek medical attention at a hospital or doctor's office? No When did it last happen? childhood reaction      If all above answers are "NO", may proceed with cephalosporin use.     VACCINATION STATUS: Immunization History  Administered Date(s) Administered   Influenza,inj,Quad PF,6+ Mos 07/03/2016, 05/07/2017, 06/20/2020, 07/24/2021, 06/13/2022   Influenza-Unspecified 05/17/2018   Moderna SARS-COV2 Booster Vaccination 03/01/2020   Moderna Sars-Covid-2 Vaccination 02/20/2020   Pneumococcal Conjugate-13 06/17/2017   Pneumococcal Polysaccharide-23 02/07/2015   Tdap 05/17/2019    Diabetes He presents for his follow-up diabetic visit. He has type 2 diabetes mellitus. Onset time: He was diagnosed at approximate age of 49 years. His disease course has been stable. There are no hypoglycemic associated symptoms. Pertinent negatives for hypoglycemia include no confusion, headaches,  pallor or seizures. Pertinent negatives for diabetes include no chest pain, no fatigue, no polydipsia, no polyphagia, no polyuria and no weakness. There are no hypoglycemic complications. Symptoms are stable. Diabetic complications include heart disease and peripheral neuropathy. Risk factors for coronary artery disease include  dyslipidemia, diabetes mellitus, hypertension, male sex, family history, obesity, sedentary lifestyle and tobacco exposure. Current diabetic treatment includes oral agent (monotherapy). He is compliant with treatment most of the time. His weight is fluctuating minimally. He is following a generally unhealthy diet. When asked about meal planning, he reported none. He has had a previous visit with a dietitian. He never participates in exercise. His home blood glucose trend is fluctuating minimally. His breakfast blood glucose range is generally 130-140 mg/dl. His bedtime blood glucose range is generally 140-180 mg/dl. His overall blood glucose range is 140-180 mg/dl. (He presents with stable glycemic profile and recent A1c of 6.7%.  No hypoglycemia documented.  He is monitoring rarely however, 155 average blood glucose for the last 30 days.      ) An ACE inhibitor/angiotensin II receptor blocker is contraindicated (He has documented allergy for ACE inhibitor's). He does not see a podiatrist.Eye exam is not current.  Hyperlipidemia This is a chronic problem. The current episode started more than 1 year ago. Exacerbating diseases include diabetes and obesity. Pertinent negatives include no chest pain, myalgias or shortness of breath. Current antihyperlipidemic treatment includes statins. Risk factors for coronary artery disease include dyslipidemia, diabetes mellitus, family history, obesity, male sex, hypertension and a sedentary lifestyle.  Hypertension This is a chronic problem. The current episode started more than 1 year ago. Pertinent negatives include no chest pain, headaches, neck pain, palpitations or shortness of breath. Risk factors for coronary artery disease include diabetes mellitus, dyslipidemia, obesity, sedentary lifestyle and smoking/tobacco exposure. Past treatments include calcium channel blockers. Hypertensive end-organ damage includes CAD/MI.    Review of systems    Objective:     BP 126/74   Pulse 88   Ht 5\' 5"  (1.651 m)   Wt 202 lb 6.4 oz (91.8 kg)   BMI 33.68 kg/m   Wt Readings from Last 3 Encounters:  12/26/22 202 lb 6.4 oz (91.8 kg)  12/25/22 201 lb (91.2 kg)  12/18/22 199 lb (90.3 kg)     Physical Exam- Limited   CMP     Component Value Date/Time   NA 136 12/22/2022 0804   K 4.6 12/22/2022 0804   CL 100 12/22/2022 0804   CO2 21 12/22/2022 0804   GLUCOSE 133 (H) 12/22/2022 0804   GLUCOSE 122 (H) 09/29/2022 0836   BUN 10 12/22/2022 0804   CREATININE 0.72 (L) 12/22/2022 0804   CREATININE 0.68 (L) 09/29/2022 0836   CALCIUM 10.0 12/22/2022 0804   PROT 7.6 12/22/2022 0804   ALBUMIN 4.6 12/22/2022 0804   AST 43 (H) 12/22/2022 0804   ALT 47 (H) 12/22/2022 0804   ALKPHOS 115 12/22/2022 0804   BILITOT 1.2 12/22/2022 0804   GFRNONAA >60 09/06/2021 0704   GFRNONAA 108 06/21/2020 0707   GFRAA 127 10/24/2020 0811   GFRAA 125 06/21/2020 0707    Diabetic Labs (most recent): Lab Results  Component Value Date   HGBA1C 6.7 (H) 12/25/2022   HGBA1C 6.7 06/23/2022   HGBA1C 6.5 (H) 02/17/2022   MICROALBUR 1.8 11/09/2019   MICROALBUR 0.7 11/17/2017   MICROALBUR 0.9 04/15/2017     Lipid Panel ( most recent) Lipid Panel     Component Value Date/Time   CHOL 197 12/25/2022  1029   TRIG 319 (H) 12/25/2022 1029   HDL 40 12/25/2022 1029   CHOLHDL 4.9 12/25/2022 1029   CHOLHDL 7.1 (H) 06/21/2020 0707   VLDL 41 (H) 04/27/2017 0505   LDLCALC 103 (H) 12/25/2022 1029   LDLCALC 116 (H) 06/21/2020 0707      Lab Results  Component Value Date   TSH 3.61 06/21/2020   TSH 1.58 11/03/2018   TSH 1.79 11/17/2017   TSH 1.635 04/26/2017   TSH 2.564 06/27/2015   FREET4 1.5 11/03/2018   FREET4 1.4 11/17/2017      Assessment & Plan:   1. DM type 2 causing vascular disease (HCC)  - [name] has currently uncontrolled symptomatic type 2 DM since  57 years of age.  He presents with stable glycemic profile and recent A1c of 6.7%.  No hypoglycemia  documented.  He is monitoring rarely however, 155 average blood glucose for the last 30 days.     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  He also has recent diagnosis of CHF, coronary artery disease which required stent placement,  and [name] remains at extremely high risk for more acute and chronic complications which include CAD, CVA, CKD, retinopathy, and neuropathy. These are all discussed in detail with the patient.  - I have counseled him on diet management and weight loss, by adopting a carbohydrate restricted/protein rich diet. -Still admits to dietary indiscretions including consumption of sweets and sweetened beverages.   he acknowledges that there is a room for improvement in his food and drink choices. - Suggestion is made for him to avoid simple carbohydrates  from his diet including Cakes, Sweet Desserts, Ice Cream, Soda (diet and regular), Sweet Tea, Candies, Chips, Cookies, Store Bought Juices, Alcohol in Excess of  1-2 drinks a day, Artificial Sweeteners,  Coffee Creamer, and "Sugar-free" Products, Lemonade. This will help patient to have more stable blood glucose profile and potentially avoid unintended weight gain.  - I encouraged him to switch to  unprocessed or minimally processed complex starch and increased protein intake (animal or plant source), fruits, and vegetables.  - he is advised to stick to a routine mealtimes to eat 3 meals  a day and avoid unnecessary snacks ( to snack only to correct hypoglycemia).    - I have approached him with the following individualized plan to manage diabetes and patient agrees:   -In light of his presentation with near target glycemic profile, he will not need insulin treatment for now.  He is advised to continue Farxiga 10 mg p.o. daily at breakfast, Trulicity 1.5 mg subcutaneously weekly.   He is also  on low-dose glipizide 5 mg p.o. daily at breakfast.   He is approached to continue monitoring blood glucose at least twice a day-daily before breakfast and at bedtime.   The single most important intervention would be smoking cessation and dietary change for him.  The patient was counseled on the dangers of tobacco use, and was advised to quit.  Reviewed strategies to maximize success, including removing cigarettes and smoking materials from environment.  Whole food plant-based diet was discussed with him to address most of his chronic diseases including type 2 diabetes, hyperlipidemia, hypertension, obesity.   -He is encouraged to call clinic for blood glucose readings less than 70 or greater than 200x3 in a week.    -Patient specific target  A1c;  LDL, HDL,  Triglycerides, and  Waist Circumference were discussed in detail.  2) BP/HTN:  -His blood pressure is controlled to target.  -He remains a chronic heavy smoker. He has a documented allergy of ACE inhibitors anaphylaxis .  He is advised to continue amlodipine 10 mg p.o. daily, with as needed diuretics.  He is extensively counseled for smoking cessation.     3) Lipids/HPL: Recent labs show LDL increasing to 103.  He admits to have been inconsistent taking his statin.  He would benefit from atorvastatin 80 mg p.o. nightly.  His history of  fatty liver disease, and cirrhosis of liver likely induced by alcohol, complicates his options of treatment to manage dyslipidemia.     4)  Weight/Diet: His BMI is 33.68- abdominal obesity clearly complicating his diabetes care.  He is a candidate for modest weight loss.  CDE Consult has been initiated and in progress,  exercise, and detailed carbohydrates information provided.  5) Chronic Care/Health Maintenance:  -he  is on Statin medications and  is encouraged to continue to follow up with Ophthalmology, Dentist,  Podiatrist at least yearly or according to recommendations, and advised to  Quit  smoking ( is a chronic heavy smoker for the last 40 days), and consider weaning off of alcohol.  I have recommended yearly flu vaccine and pneumonia vaccination at least every 5 years; moderate intensity exercise for up to 150 minutes weekly; and  sleep for at least 7 hours a day.  The patient was counseled on the dangers of tobacco use, and was advised to quit.  Reviewed strategies to maximize success, including removing cigarettes and smoking materials from environment.   - I advised patient to maintain close follow up with Tommie Sams, DO for primary care needs.    I spent  26  minutes in the care of the patient today including review of labs from CMP, Lipids, Thyroid Function, Hematology (current and previous including abstractions from other facilities); face-to-face time discussing  his blood glucose readings/logs, discussing hypoglycemia and hyperglycemia episodes and symptoms, medications doses, his options of short and long term treatment based on the latest standards of care / guidelines;  discussion about incorporating lifestyle medicine;  and documenting the encounter. Risk reduction counseling performed per USPSTF guidelines to reduce  obesity and cardiovascular risk factors.     Please refer to Patient Instructions for Blood Glucose Monitoring and Insulin/Medications Dosing Guide"  in media tab for additional information. Please  also refer to " Patient Self Inventory" in the Media  tab for reviewed elements of pertinent patient history.  [name] participated in the discussions, expressed understanding, and voiced agreement with the above plans.  All questions were answered to his satisfaction. he is encouraged to contact clinic should he have any questions or concerns prior to his return visit.    Follow up plan: - Return in about 4 months (around 04/27/2023) for Bring Meter/CGM Device/Logs- A1c in Office.  Marquis Lunch, MD [facility] [address] [place], [place] [address] Phone: [phone]  Fax: [phone]    12/26/2022, 4:58 PM  This note was partially dictated with voice recognition software. Similar sounding words can be transcribed inadequately or may not  be corrected upon review.

## 2022-12-26 NOTE — Patient Instructions (Signed)

## 2022-12-26 NOTE — Telephone Encounter (Signed)
Pt come in yesterday and he was needing nebulizer supplies and the c-pap was the one that was sent. This needs to be sent to [facility] [phone]

## 2022-12-29 ENCOUNTER — Telehealth: Payer: Self-pay

## 2022-12-29 NOTE — Telephone Encounter (Signed)
Tommie Sams, DO     I sent in Rx for Albuterol neb.

## 2022-12-29 NOTE — Telephone Encounter (Signed)
Patient called and was informed his Rx was sent to pharmacy. Patient verbalized understanding.

## 2022-12-29 NOTE — Telephone Encounter (Signed)
Left message to return call 

## 2023-01-02 ENCOUNTER — Telehealth: Payer: Self-pay

## 2023-01-02 NOTE — Telephone Encounter (Signed)
Patient was informed of his results, he states was not taking atorvastatin due to cramps and is willing to try zetia , may send to [facility].

## 2023-01-04 ENCOUNTER — Other Ambulatory Visit: Payer: Self-pay | Admitting: Family Medicine

## 2023-01-04 MED ORDER — EZETIMIBE 10 MG PO TABS
10.0000 mg | ORAL_TABLET | Freq: Every day | ORAL | 3 refills | Status: DC
Start: 2023-01-04 — End: 2023-10-14

## 2023-01-12 ENCOUNTER — Ambulatory Visit
Admission: RE | Admit: 2023-01-12 | Discharge: 2023-01-12 | Disposition: A | Discharge status: Home / Self Care | Payer: 59 | Source: Ambulatory Visit | Attending: Radiation Oncology | Admitting: Radiation Oncology

## 2023-01-12 NOTE — Progress Notes (Signed)
  Radiation Oncology         [phone]) [phone] ________________________________  Name: Randy Hebert MRN: [id number]  Date of Service: 01/12/2023  DOB: 02/26/1966  Post Treatment Telephone Note  Diagnosis:   Stage IA1, cT1aN0M0, NSCLC, squamous cell carcinoma of the RUL.    Indication for treatment:  Curative        Radiation treatment dates:   11/14/22-11/21/22   Site/dose:   The tumor in the RUL was treated with a course of stereotactic body radiation treatment. The patient received 54 Gy In 3 fractions at 18 G per fraction.(as documented in provider EOT note)   The patient was not available for call today.    The patient will follow-up w/ Rad/Onc PRN and was encouraged to call if he develops concerns or questions regarding radiation.    Ruel Favors, LPN

## 2023-01-23 NOTE — Telephone Encounter (Signed)
Unable to reach patient by phone -medication requested was sent in by provider to pharmacy 12/26/22

## 2023-01-27 ENCOUNTER — Other Ambulatory Visit: Payer: Self-pay | Admitting: "Endocrinology

## 2023-01-27 DIAGNOSIS — E1159 Type 2 diabetes mellitus with other circulatory complications: Secondary | ICD-10-CM

## 2023-02-05 ENCOUNTER — Encounter: Payer: Self-pay | Admitting: Nurse Practitioner

## 2023-02-05 ENCOUNTER — Ambulatory Visit (INDEPENDENT_AMBULATORY_CARE_PROVIDER_SITE_OTHER): Payer: 59 | Admitting: Nurse Practitioner

## 2023-02-05 VITALS — BP 132/75 | HR 99 | Ht 65.0 in | Wt 200.4 lb

## 2023-02-05 DIAGNOSIS — L219 Seborrheic dermatitis, unspecified: Secondary | ICD-10-CM | POA: Diagnosis not present

## 2023-02-05 DIAGNOSIS — H608X3 Other otitis externa, bilateral: Secondary | ICD-10-CM

## 2023-02-05 MED ORDER — FLUOCINOLONE ACETONIDE 0.01 % EX SHAM: MEDICATED_SHAMPOO | CUTANEOUS | 0 refills | Status: DC

## 2023-02-05 MED ORDER — NEOMYCIN-POLYMYXIN-HC 3.5-10000-1 OT SUSP: OTIC | 0 refills | Status: DC

## 2023-02-05 NOTE — Progress Notes (Signed)
   Subjective:    Patient ID: Randy Hebert, male    DOB: November 11, 1965, 57 y.o.   MRN: [id number]  HPI Presents with complaints of headache chronic scalp rash and itching in the occipital area that began in 2016.  Was a small area at the beginning but has slowly progressed over the past year up into the mid occipital area.  Very pruritic.  Slight burning only when he scratches too much.  Also his CPAP strap comes right along the area where it is itching.  Has been generic Elocon cream but he puts to eczema on his external ears, has applied this a few times on his scalp which has helped.  Applied rubbing alcohol which caused slight burning.  Denies any other rash.  Request some eardrops to help with occasional itching.  States he has developed cauliflower ear due to frequent scratching and picking related to anxiety.  Has seen ENT specialist in the past.         Objective:   Physical Exam NAD.  Alert, oriented.  Lungs clear.  Heart regular rate rhythm.  Faint dry skin with no flaking noted along the lower to mid occipital area more on the right side.  No plaques noted.  No evidence of infection.  Significant overgrowth of skin noted on both ears with no erythema or signs of rash.  Left eyebrow has some slight dryness and flaking. Today's Vitals   02/05/23 0935  BP: 132/75  Pulse: 99  SpO2: 96%  Weight: 200 lb 6.4 oz (90.9 kg)  Height: 5\' 5"  (1.651 m)   Body mass index is 33.35 kg/m.        Assessment & Plan:   Problem List Items Addressed This Visit       Nervous and Auditory   Chronic eczematous otitis externa of both ears     Musculoskeletal and Integument   Chronic seborrheic dermatitis - Primary   Meds ordered this encounter  Medications   Fluocinolone Acetonide 0.01 % SHAM    Sig: Wet hair and apply directly to dry areas on scalp; leave on for 5-10 minutes then rinse; use 2-3 times per week prn    Dispense:  120 mL    Refill:  0    Order Specific Question:    Supervising Provider    Answer:   Lilyan Punt A [[id number]]   neomycin-polymyxin-hydrocortisone (CORTISPORIN) 3.5-10000-1 OTIC suspension    Sig: Place 3 gtts in affected ear TID prn pain or itching    Dispense:  10 mL    Refill:  0    Order Specific Question:   Supervising Provider    Answer:   Lilyan Punt A [[id number]]   Reviewed nonpharmacologic measures to help with dry scalp.  Use Capex shampoo as directed. Cortisporin otic drops as directed for ear pain or itching. Call back if symptoms worsen or persist.

## 2023-02-05 NOTE — Patient Instructions (Signed)
Use Head and Shoulders shampoo with conditioner   Follow these instructions at home: Skin care Use any medicated shampoo, skin creams, or ointments only as told by your health care provider. Do not use skin products that contain alcohol. Take lukewarm baths or showers. Avoid very hot water. When you are outside, wear a hat and clothes that block UV light. General instructions Apply over-the-counter and prescription medicines only as told by your health care provider. Learn what triggers your symptoms so you can avoid these things. Use techniques for stress reduction, such as meditation or yoga. Do not drink alcohol if your health care provider tells you not to drink.

## 2023-02-24 ENCOUNTER — Ambulatory Visit (INDEPENDENT_AMBULATORY_CARE_PROVIDER_SITE_OTHER): Payer: 59 | Admitting: Family Medicine

## 2023-02-24 VITALS — BP 125/76 | HR 93 | Temp 98.2°F | Ht 65.0 in | Wt 196.2 lb

## 2023-02-24 DIAGNOSIS — N5201 Erectile dysfunction due to arterial insufficiency: Secondary | ICD-10-CM

## 2023-02-24 DIAGNOSIS — N529 Male erectile dysfunction, unspecified: Secondary | ICD-10-CM | POA: Insufficient documentation

## 2023-02-24 MED ORDER — SILDENAFIL CITRATE 25 MG PO TABS: 25.0000 mg | ORAL_TABLET | ORAL | 0 refills | Status: DC | PRN

## 2023-02-24 NOTE — Assessment & Plan Note (Signed)
Lengthy discussion today about use of PDE 5 inhibitors in the setting of his medical problems.  Advised sparing use.  Advised to not use nitrates/nitroglycerin.  Advised of potential severe side effects.  Rx sent for sildenafil.

## 2023-02-24 NOTE — Patient Instructions (Signed)
Look out for side effects.  You cannot use this medication and use nitroglycerin.  Use sparingly due to your underlying medical issues/heart disease.

## 2023-02-24 NOTE — Progress Notes (Signed)
Subjective:  Patient ID: Randy Hebert, male    DOB: [birth year]  Age: 57 y.o. MRN: [id number]  CC:  Personal issue  HPI:  57 year old male with an extensive past medical history including coronary disease, heart failure, hypertension, type 2 diabetes, lung cancer, cirrhosis presents for evaluation of the above.  Patient reports that he is having difficulty with erections.  Having difficulty getting fully aroused and maintaining erection.  He states that he has a new partner and is requesting sildenafil.  Patient not currently taking nitrates for chest pain.  He has previously been prescribed this by his cardiologist.  Will discuss this at length today.  Patient Active Problem List   Diagnosis Date Noted   Erectile dysfunction 02/24/2023   Chronic seborrheic dermatitis 02/05/2023   Chronic eczematous otitis externa of both ears 02/05/2023   Tobacco abuse 12/25/2022   Malignant neoplasm of upper lobe of right lung (HCC) 09/25/2022   Osteoarthritis of both hands 05/19/2022   Cervical radiculopathy 02/21/2022   History of GI bleed 11/13/2021   Insomnia 09/13/2021   Seasonal and perennial allergic rhinitis 11/10/2018   Cirrhosis of liver (HCC) 11/08/2018   Barrett's esophagus 08/04/2018   Vitamin D deficiency 06/30/2018   Alcohol abuse 06/30/2018   DM type 2 causing vascular disease (HCC) 08/17/2017   Chronic combined systolic and diastolic CHF (congestive heart failure) (HCC) 05/15/2017   OSA (obstructive sleep apnea) 04/28/2017   Depression with anxiety 04/15/2017   GERD (gastroesophageal reflux disease) 09/23/2016   Asthmatic bronchitis , chronic 08/27/2015   Mixed hyperlipidemia 08/27/2015   Class 1 obesity due to excess calories with serious comorbidity and body mass index (BMI) of 32.0 to 32.9 in adult 08/27/2015   CAD (coronary artery disease) 05/31/2015   Current smoker 05/31/2015   Essential hypertension, benign 02/09/2015    Social Hx   Social History    Socioeconomic History   Marital status: Divorced    Spouse name: alice   Number of children: 3   Years of education: 12   Highest education level: Not on file  Occupational History   Occupation: SSI  Tobacco Use   Smoking status: Every Day    Packs/day: 1.00    Years: 46.00    Additional pack years: 0.00    Total pack years: 46.00    Types: Cigarettes    Passive exposure: Never   Smokeless tobacco: Former    Types: Chew   Tobacco comments:    Smokes 1 pack a day of cigarettes   Vaping Use   Vaping Use: Former  Substance and Sexual Activity   Alcohol use: Yes    Comment: rare beer use   Drug use: No    Comment: history of drug use- marijuana, cocaine- quit 2014   Sexual activity: Yes    Partners: Female    Birth control/protection: None  Other Topics Concern   Not on file  Social History Narrative   Lives with girlfriend [name]   3 children, but 1 OD  In 2019.   Grandchildren-2      Two cats: may and princess; and a dog-foxy      Enjoy: spending time with pets, TV, yard work       Diet: eats all food groups   Caffeine: half and half coffee, some tea-half and half decaf   Water: 6-8 cups daily      Wears seat belt   Does not use phone while driving   Smoke detectors at home  Social Determinants of Health   Financial Resource Strain: Low Risk  (11/14/2022)   Overall Financial Resource Strain (CARDIA)    Difficulty of Paying Living Expenses: Not hard at all  Food Insecurity: No Food Insecurity (11/14/2022)   Hunger Vital Sign    Worried About Running Out of Food in the Last Year: Never true    Ran Out of Food in the Last Year: Never true  Transportation Needs: No Transportation Needs (11/14/2022)   PRAPARE - Administrator, Civil Service (Medical): No    Lack of Transportation (Non-Medical): No  Physical Activity: Inactive (11/14/2022)   Exercise Vital Sign    Days of Exercise per Week: 0 days    Minutes of Exercise per Session: 0 min   Stress: No Stress Concern Present (10/28/2021)   [employer] of Occupational Health - Occupational Stress Questionnaire    Feeling of Stress : Not at all  Social Connections: Moderately Integrated (11/14/2022)   Social Connection and Isolation Panel [NHANES]    Frequency of Communication with Friends and Family: More than three times a week    Frequency of Social Gatherings with Friends and Family: Once a week    Attends Religious Services: More than 4 times per year    Active Member of [employer] or Organizations: Yes    Attends Engineer, structural: More than 4 times per year    Marital Status: Divorced    Review of Systems Per HPI  Objective:  BP 125/76   Pulse 93   Temp 98.2 F (36.8 C)   Ht 5\' 5"  (1.651 m)   Wt 196 lb 3.2 oz (89 kg)   SpO2 94%   BMI 32.65 kg/m      02/24/2023   11:52 AM 02/05/2023    9:35 AM 12/26/2022    8:35 AM  BP/Weight  Systolic BP 125 132 126  Diastolic BP 76 75 74  Wt. (Lbs) 196.2 200.4 202.4  BMI 32.65 kg/m2 33.35 kg/m2 33.68 kg/m2    Physical Exam Vitals and nursing note reviewed.  Constitutional:      Appearance: Normal appearance.  HENT:     Head: Normocephalic and atraumatic.  Pulmonary:     Effort: Pulmonary effort is normal. No respiratory distress.  Neurological:     Mental Status: He is alert.  Psychiatric:        Mood and Affect: Mood normal.        Behavior: Behavior normal.     Lab Results  Component Value Date   WBC 8.3 09/29/2022   HGB 16.5 09/29/2022   HCT 46.9 09/29/2022   PLT 180 09/29/2022   GLUCOSE 133 (H) 12/22/2022   CHOL 197 12/25/2022   TRIG 319 (H) 12/25/2022   HDL 40 12/25/2022   LDLCALC 103 (H) 12/25/2022   ALT 47 (H) 12/22/2022   AST 43 (H) 12/22/2022   NA 136 12/22/2022   K 4.6 12/22/2022   CL 100 12/22/2022   CREATININE 0.72 (L) 12/22/2022   BUN 10 12/22/2022   CO2 21 12/22/2022   TSH 3.61 06/21/2020   PSA 0.23 06/21/2020   INR 1.1 09/29/2022   HGBA1C 6.7 (H) 12/25/2022    MICROALBUR 1.8 11/09/2019     Assessment & Plan:   Problem List Items Addressed This Visit       Other   Erectile dysfunction - Primary    Lengthy discussion today about use of PDE 5 inhibitors in the setting of his medical problems.  Advised  sparing use.  Advised to not use nitrates/nitroglycerin.  Advised of potential severe side effects.  Rx sent for sildenafil.       Meds ordered this encounter  Medications   sildenafil (VIAGRA) 25 MG tablet    Sig: Take 1 tablet (25 mg total) by mouth as needed for erectile dysfunction (1 hour prior to intercourse.).    Dispense:  10 tablet    Refill:  0   Dayra Rapley DO [facility]

## 2023-03-09 ENCOUNTER — Telehealth: Payer: Self-pay | Admitting: Student

## 2023-03-09 ENCOUNTER — Other Ambulatory Visit: Payer: Self-pay | Admitting: Nurse Practitioner

## 2023-03-09 DIAGNOSIS — C3491 Malignant neoplasm of unspecified part of right bronchus or lung: Secondary | ICD-10-CM

## 2023-03-09 NOTE — Telephone Encounter (Signed)
Please try to reach again to sched CT scan. PT # is [phone]

## 2023-03-10 NOTE — Telephone Encounter (Signed)
I will need a new order since this is under Dr Thora Lance

## 2023-03-11 ENCOUNTER — Telehealth: Payer: Self-pay | Admitting: *Deleted

## 2023-03-11 DIAGNOSIS — K7460 Unspecified cirrhosis of liver: Secondary | ICD-10-CM

## 2023-03-11 NOTE — Telephone Encounter (Signed)
Pt had left vm stating it was time for his [place] to be scheduled.  Pt not due for ultrasound until August.  [place] has been scheduled for 04/20/23, arrive at 8:15 am, NPO after midnight.  LMOVM with detailed message of appt date,time and instructions for [place]. Any questions or concerns, to give me a call back.

## 2023-03-12 NOTE — Telephone Encounter (Signed)
Yes ok to place under my name.

## 2023-03-12 NOTE — Addendum Note (Signed)
Addended by: Maurene Capes on: 03/12/2023 02:10 PM   Modules accepted: Orders

## 2023-03-12 NOTE — Telephone Encounter (Signed)
Dr. Delton Coombes, would you be willing to take care for this patient since he has a history of a lung nodule? And if so, would you be ok with ordering the CT under your name?

## 2023-03-12 NOTE — Telephone Encounter (Signed)
It looks like the patient was scheduled back with MW after message was sent. Will order CT under MW's name.   Will close encounter.

## 2023-03-17 ENCOUNTER — Ambulatory Visit: Payer: 59 | Attending: Internal Medicine | Admitting: Internal Medicine

## 2023-03-17 VITALS — BP 140/80 | HR 86 | Ht 65.0 in | Wt 195.4 lb

## 2023-03-17 DIAGNOSIS — R55 Syncope and collapse: Secondary | ICD-10-CM

## 2023-03-17 NOTE — Patient Instructions (Signed)
Medication Instructions:  Your physician recommends that you continue on your current medications as directed. Please refer to the Current Medication list given to you today.  *If you need a refill on your cardiac medications before your next appointment, please call your pharmacy*   Lab Work: NONE   If you have labs (blood work) drawn today and your tests are completely normal, you will receive your results only by: MyChart Message (if you have MyChart) OR A paper copy in the mail If you have any lab test that is abnormal or we need to change your treatment, we will call you to review the results.   Testing/Procedures: NONE    Follow-Up: At [facility], you and your health needs are our priority.  As part of our continuing mission to provide you with exceptional heart care, we have created designated Provider Care Teams.  These Care Teams include your primary Cardiologist (physician) and Advanced Practice Providers (APPs -  Physician Assistants and Nurse Practitioners) who all work together to provide you with the care you need, when you need it.  We recommend signing up for the patient portal called "MyChart".  Sign up information is provided on this After Visit Summary.  MyChart is used to connect with patients for Virtual Visits (Telemedicine).  Patients are able to view lab/test results, encounter notes, upcoming appointments, etc.  Non-urgent messages can be sent to your provider as well.   To learn more about what you can do with MyChart, go to [email or web address].    Your next appointment:   1 year(s)  Provider:   Gregg Taylor, MD    Other Instructions Thank you for choosing [facility]!    

## 2023-03-17 NOTE — Progress Notes (Signed)
HPI Randy Hebert returns today for followup. He is a pleasant morbidly obese man with a h/o syncope, s/p ILR insertion. He has had some dizzy spells but was taking too much diuretic. He has stopped drinking and using cocaine. His son overdosed. He admits to some dietary indiscretion though he has lost 10 lbs in the last year. He has not had syncope.  He states that the stress in his life is improved since he started to stay by himself.  Allergies  Allergen Reactions   Carvedilol Other (See Comments)    Sinus pause on telemetry >3 seconds. Longest one 9 sec. No AV nodal agent   Lisinopril Anaphylaxis, Shortness Of Breath and Swelling    Angioedema, required intubation and mechanical ventilation   Chantix [Varenicline] Other (See Comments)    Nightmares and unable to sleep well   Amoxicillin Nausea Only    Did it involve swelling of the face/tongue/throat, SOB, or low BP? No Did it involve sudden or severe rash/hives, skin peeling, or any reaction on the inside of your mouth or nose? No Did you need to seek medical attention at a hospital or doctor's office? No When did it last happen? childhood reaction      If all above answers are "NO", may proceed with cephalosporin use.      Current Outpatient Medications  Medication Sig Dispense Refill   acetaminophen (TYLENOL) 500 MG tablet Take 2 tablets (1,000 mg total) by mouth every 8 (eight) hours as needed for mild pain or fever.     albuterol (PROVENTIL) (2.5 MG/3ML) 0.083% nebulizer solution Take 3 mLs (2.5 mg total) by nebulization every 6 (six) hours as needed for wheezing or shortness of breath. 150 mL 1   albuterol (VENTOLIN HFA) 108 (90 Base) MCG/ACT inhaler INHALE 2 PUFFS INTO THE LUNGS EVERY 6 HOURS AS NEEDEDFOR SHORTNESS OF BREATH OR WHEEZING. 18 g 1   amLODipine (NORVASC) 10 MG tablet TAKE 1 TABLET BY MOUTH ONCE A DAY. 90 tablet 1   aspirin 81 MG EC tablet Take 1 tablet (81 mg total) by mouth daily. (Patient taking differently:  Take 81 mg by mouth in the morning and at bedtime.) 30 tablet    atorvastatin (LIPITOR) 80 MG tablet Take 1 tablet by mouth once daily 90 tablet 0   azelastine (ASTELIN) 0.1 % nasal spray USE 2 SPRAYS IN EACH NOSTRIL TWICE DAILY. 30 mL 11   Blood Glucose Monitoring Suppl (ACCU-CHEK GUIDE ME) w/Device KIT 1 Piece by Does not apply route as directed. 1 kit 0   budesonide-formoterol (SYMBICORT) 160-4.5 MCG/ACT inhaler Take 2 puffs first thing in am and then another 2 puffs about 12 hours later. 1 each 12   buPROPion (WELLBUTRIN SR) 150 MG 12 hr tablet Take 1 tablet (150 mg total) by mouth 2 (two) times daily. 180 tablet 0   Cholecalciferol (VITAMIN D3) 125 MCG (5000 UT) CAPS Take 1 capsule (5,000 Units total) by mouth daily. 90 capsule 0   citalopram (CELEXA) 20 MG tablet Take 1 tablet (20 mg total) by mouth daily. 90 tablet 3   cycloSPORINE (RESTASIS) 0.05 % ophthalmic emulsion Place 1 drop into both eyes 2 (two) times daily.     dapagliflozin propanediol (FARXIGA) 10 MG TABS tablet Take 10 mg by mouth daily.     Dulaglutide (TRULICITY) 1.5 MG/0.5ML SOPN INJECT 1.5 MG INTO THE SKIN ONCE WEEKLY. 6 mL 0   ezetimibe (ZETIA) 10 MG tablet Take 1 tablet (10 mg total) by  mouth daily. 90 tablet 3   ferrous sulfate 325 (65 FE) MG tablet Take 1 tablet (325 mg total) by mouth daily with breakfast. 90 tablet 1   Fluocinolone Acetonide 0.01 % SHAM Wet hair and apply directly to dry areas on scalp; leave on for 5-10 minutes then rinse; use 2-3 times per week prn 120 mL 0   furosemide (LASIX) 40 MG tablet Take 1 tablet (40 mg total) by mouth daily as needed for fluid. 30 tablet 11   glipiZIDE (GLUCOTROL XL) 5 MG 24 hr tablet TAKE 1 TABLET BY MOUTH DAILY WITH BREAKFAST. 90 tablet 0   glucose blood (ACCU-CHEK GUIDE) test strip Use to test glucose 2 times daily. 100 each 2   lactulose (CHRONULAC) 10 GM/15ML solution TAKE 15 MLS BY MOUTH ONCE DAILY. 473 mL 0   magnesium gluconate (MAGONATE) 500 MG tablet Take 500 mg by  mouth 2 (two) times daily.     metFORMIN (GLUCOPHAGE) 500 MG tablet TAKE 1 TABLET BY MOUTH TWICE DAILY WITH MEALS. 60 tablet 3   mometasone (ELOCON) 0.1 % cream Apply 1 application topically daily as needed (eczema on ears).     NEOMYCIN-POLYMYXIN-HYDROCORTISONE (CORTISPORIN) 1 % SOLN OTIC solution INSTILL 3 DROPS INTO AFFECTED EAR(S) 3 TIMES DAILY AS NEEDED. 10 mL 0   nitroGLYCERIN (NITROSTAT) 0.4 MG SL tablet Place 0.4 mg under the tongue every 5 (five) minutes as needed.     olopatadine (PATANOL) 0.1 % ophthalmic solution Place 1 drop into both eyes 2 (two) times daily.     pantoprazole (PROTONIX) 40 MG tablet TAKE (1) TABLET BY MOUTH TWICE DAILY BEFORE A MEAL. 180 tablet 3   sildenafil (VIAGRA) 25 MG tablet Take 1 tablet (25 mg total) by mouth as needed for erectile dysfunction (1 hour prior to intercourse.). 10 tablet 0   ascorbic acid (VITAMIN C) 500 MG tablet Take 1 tablet (500 mg total) by mouth daily. (Patient not taking: Reported on 03/17/2023) 30 tablet 1   No current facility-administered medications for this visit.     Past Medical History:  Diagnosis Date   ACE inhibitor-aggravated angioedema    Allergy    Anemia    Angio-edema    Anxiety    Arthritis    Asthma    hip replacement   Back pain    Bradycardia 04/28/2017   Bulging of cervical intervertebral disc    CAD (coronary artery disease)    lateral STEMI 02/06/2015 00% D1 occlusion treated with Promus Premier 2.5 mm x 16 mm DES, 70% ramus stenosis, 40% mid RCA stenosis, 45% distal RCA stenosis, EF 45-50%   CHF (congestive heart failure) (HCC)    COPD (chronic obstructive pulmonary disease) (HCC)    Depression    Diabetes mellitus without complication (HCC)    Difficult intubation    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   Dry eye    Dyspnea    Early satiety 09/23/2016   Fatty liver    GERD (gastroesophageal reflux disease)    HCAP (healthcare-associated pneumonia) 05/15/2017   Headache    Heart  murmur    Hip pain    Hyperlipidemia    Hypertension    Lobar pneumonia (HCC) 05/15/2017   Melena 08/04/2018   MI (myocardial infarction) [facility])    Myocardial infarction (HCC)    Neck pain    Non-ST elevation (NSTEMI) myocardial infarction (HCC) 04/27/2017   NSTEMI (non-ST elevated myocardial infarction) (HCC) 04/26/2017   Otitis media    Pleurisy  Pneumonia due to COVID-19 virus 10/07/2019   Rectal bleeding 11/08/2018   Right shoulder pain 03/20/2020   Sinus pause    9 sec sinus pause on telemetry after started on coreg after MI, avoid AV nodal blocking agent   Sleep apnea    uses a cpap   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   Past Surgical History:  Procedure Laterality Date   BIOPSY  10/09/2016   Procedure: BIOPSY;  Surgeon: Corbin Ade, MD;  Location: [facility];  Service: Endoscopy;;   BIOPSY  11/28/2019   Procedure: BIOPSY;  Surgeon: Corbin Ade, MD;  Location: [facility];  Service: Endoscopy;;   BRONCHIAL BIOPSY  10/16/2022   Procedure: BRONCHIAL BIOPSIES;  Surgeon: Omar Person, MD;  Location: [facility] ENDOSCOPY;  Service: Pulmonary;;   BRONCHIAL NEEDLE ASPIRATION BIOPSY  10/16/2022   Procedure: BRONCHIAL NEEDLE ASPIRATION BIOPSIES;  Surgeon: Omar Person, MD;  Location: [facility] ENDOSCOPY;  Service: Pulmonary;;   BRONCHIAL WASHINGS  10/16/2022   Procedure: BRONCHIAL WASHINGS;  Surgeon: Omar Person, MD;  Location: [facility] ENDOSCOPY;  Service: Pulmonary;;   CARDIAC CATHETERIZATION N/A 02/06/2015   Procedure: Left Heart Cath and Coronary Angiography;  Surgeon: Marykay Lex, MD;  Location: [facility] INVASIVE CV LAB;  Service:  Cardiovascular;  Laterality: N/A;   CARDIAC CATHETERIZATION N/A 02/06/2015   Procedure: Coronary Stent Intervention;  Surgeon: Marykay Lex, MD;  Location: [facility] INVASIVE CV LAB;  Service: Cardiovascular;  Laterality: N/A;   COLONOSCOPY WITH PROPOFOL N/A 10/09/2016   Sigmoid and descending colon diverticulosis, four 4-6 mm polyps in sigmoid, one 4 mm polyp in descending. Tubular adenomas and hyperplastic. 5 year surveillance.    COLONOSCOPY WITH PROPOFOL N/A 11/24/2016   Sigmoid and descending colon diverticulosis, four 4-6 mm polyps in sigmoid, one 4 mm polyp in descending. Tubular adenomas and hyperplastic. 5 year surveillance.    COLONOSCOPY WITH PROPOFOL N/A 10/18/2021   Carver: nonbleeding internal hemorrhoids, small and large mouth diverticula found in the sigmoid, descending, transverse colon.  A 9 mm sessile polyp was found in the ascending colon that was removed.  4 sessile polyps found in the sigmoid, descending, transverse colon 3 to 5 mm in size, examining otherwise.  Path revealed tubular adenomas.  Repeat due in 5 years for surveillance.   CORONARY ANGIOPLASTY WITH STENT PLACEMENT  01/2015   CORONARY STENT INTERVENTION N/A 04/27/2017   Procedure: CORONARY STENT INTERVENTION;  Surgeon: Yvonne Kendall, MD;  Location: [facility] INVASIVE CV LAB;  Service: Cardiovascular;  Laterality: N/A;   ELECTROPHYSIOLOGY STUDY N/A 06/29/2017   Procedure: ELECTROPHYSIOLOGY STUDY;  Surgeon: Marinus Maw, MD;  Location: [facility] INVASIVE CV LAB;  Service: Cardiovascular;  Laterality: N/A;   ESOPHAGOGASTRODUODENOSCOPY (EGD) WITH PROPOFOL N/A 10/09/2016   Dr. Jena Gauss: LA grade a esophagitis.  Barrett's esophagus, biopsy-proven.  Small hiatal hernia.  EGD February 2019   ESOPHAGOGASTRODUODENOSCOPY (EGD) WITH PROPOFOL N/A 11/28/2019    salmon-colored esophageal mucosa (Barrett's) small hiatal hernia, portal hypertensive gastropathy, normal duodenum, 3 year surveillance   ESOPHAGOGASTRODUODENOSCOPY (EGD) WITH PROPOFOL  N/A 09/06/2021   three columns of grade 1 varices in distal esophagus, no stigmata of bleeding or  red wale signs. Small hiatal hernia. Mild portal gastropathy. Normal duodenum. Repeat upper endoscopy in 1 year for surveillance.   FIDUCIAL MARKER PLACEMENT  10/16/2022   Procedure: FIDUCIAL MARKER PLACEMENT;  Surgeon: Omar Person, MD;  Location: [facility] ENDOSCOPY;  Service: Pulmonary;;   INCISION / DRAINAGE HAND / FINGER     LEFT HEART CATH AND CORONARY ANGIOGRAPHY N/A 04/27/2017   Procedure: LEFT HEART CATH AND CORONARY ANGIOGRAPHY;  Surgeon: Yvonne Kendall, MD;  Location: [facility] INVASIVE CV LAB;  Service: Cardiovascular;  Laterality: N/A;   LOOP RECORDER INSERTION  06/29/2017   Procedure: Loop Recorder Insertion;  Surgeon: Marinus Maw, MD;  Location: [facility] INVASIVE CV LAB;  Service: Cardiovascular;;   POLYPECTOMY  11/24/2016   Procedure: POLYPECTOMY;  Surgeon: Corbin Ade, MD;  Location: [facility];  Service: Endoscopy;;  descending and sigmoid   POLYPECTOMY  10/18/2021   Procedure: POLYPECTOMY;  Surgeon: Lanelle Bal, DO;  Location: [facility];  Service: Endoscopy;;   TOTAL HIP ARTHROPLASTY Right 07/01/2016   TOTAL HIP ARTHROPLASTY Right 07/01/2016   Procedure: RIGHT TOTAL HIP ARTHROPLASTY ANTERIOR APPROACH;  Surgeon: Kathryne Hitch, MD;  [facility] OR;  Service: Orthopedics;  Laterality: Right;     Family History  Problem Relation Age of Onset   Heart attack Father    Stroke Father    Arthritis Father    Heart disease Father    Cancer Mother        ???   Arthritis Mother    Heart disease Brother 50       died in sleep   Early death Brother    Diabetes Maternal Uncle    Alzheimer's disease Maternal Grandmother      Social History   Socioeconomic History   Marital status: Divorced    Spouse name: alice   Number of children: 3   Years of education: 12   Highest education level: Not on file  Occupational History   Occupation: SSI  Tobacco Use    Smoking status: Every Day    Current packs/day: 1.00    Average packs/day: 1 pack/day for 46.0 years (46.0 ttl pk-yrs)    Types: Cigarettes    Passive exposure: Never   Smokeless tobacco: Former    Types: Chew   Tobacco comments:    Smokes 1 pack a day of cigarettes   Vaping Use   Vaping status: Former  Substance and Sexual Activity   Alcohol use: Yes    Comment: rare beer use   Drug use: No    Comment: history of drug use- marijuana, cocaine- quit 2014   Sexual activity: Yes    Partners: Female    Birth control/protection: None  Other Topics Concern   Not on file  Social History Narrative   Lives with girlfriend [name]   3 children, but 1 OD  In 2019.   Grandchildren-2      Two cats: may and princess; and a dog-foxy      Enjoy: spending time with pets, TV, yard work       Diet: eats all food groups   Caffeine: half and half coffee, some tea-half and half decaf   Water: 6-8 cups daily      Wears seat belt   Does not use phone while driving   Psychologist, sport and exercise at home      Social Determinants of Health   Financial Resource Strain: Low Risk  (11/14/2022)   Overall Financial Resource Strain (CARDIA)  Difficulty of Paying Living Expenses: Not hard at all  Food Insecurity: No Food Insecurity (11/14/2022)   Hunger Vital Sign    Worried About Running Out of Food in the Last Year: Never true    Ran Out of Food in the Last Year: Never true  Transportation Needs: No Transportation Needs (11/14/2022)   PRAPARE - Administrator, Civil Service (Medical): No    Lack of Transportation (Non-Medical): No  Physical Activity: Inactive (11/14/2022)   Exercise Vital Sign    Days of Exercise per Week: 0 days    Minutes of Exercise per Session: 0 min  Stress: No Stress Concern Present (10/28/2021)   [employer] of Occupational Health - Occupational Stress Questionnaire    Feeling of Stress : Not at all  Social Connections: Moderately Integrated (11/14/2022)   Social  Connection and Isolation Panel [NHANES]    Frequency of Communication with Friends and Family: More than three times a week    Frequency of Social Gatherings with Friends and Family: Once a week    Attends Religious Services: More than 4 times per year    Active Member of [employer] or Organizations: Yes    Attends Engineer, structural: More than 4 times per year    Marital Status: Divorced  Intimate Partner Violence: Not At Risk (11/14/2022)   Humiliation, Afraid, Rape, and Kick questionnaire    Fear of Current or Ex-Partner: No    Emotionally Abused: No    Physically Abused: No    Sexually Abused: No     BP (!) 140/80 (BP Location: Left Arm, Patient Position: Sitting, Cuff Size: Normal)   Pulse 86   Ht 5\' 5"  (1.651 m)   Wt 195 lb 6.4 oz (88.6 kg)   SpO2 95%   BMI 32.52 kg/m   Physical Exam:  obese appearing NAD HEENT: Unremarkable Neck:  No JVD, no thyromegally Lymphatics:  No adenopathy Back:  No CVA tenderness Lungs:  Clear with no wheezes HEART:  Regular rate rhythm, no murmurs, no rubs, no clicks Abd:  soft, positive bowel sounds, no organomegally, no rebound, no guarding Ext:  2 plus pulses, no edema, no cyanosis, no clubbing Skin:  No rashes no nodules Neuro:  CN II through XII intact, motor grossly intact   Assess/Plan:  1. Syncope - he is s/p ILR and has done well with no arrhythmias. He has gone over 6 months without an episode. He is not interested in having his ILR removed. 2. Obesity - he has lost some weight. 3. HTN - his bp is back to normal with weight loss and medical therapy. 4. CAD - he is very active and denies anginal symptoms.    Sharlot Gowda Kristene Liberati,MD

## 2023-03-24 ENCOUNTER — Emergency Department (HOSPITAL_COMMUNITY)
Admission: EM | Admit: 2023-03-24 | Discharge: 2023-03-24 | Disposition: A | Discharge status: Home / Self Care | Payer: 59 | Attending: Emergency Medicine | Admitting: Emergency Medicine

## 2023-03-24 ENCOUNTER — Emergency Department (HOSPITAL_COMMUNITY): Payer: 59

## 2023-03-24 ENCOUNTER — Other Ambulatory Visit: Payer: Self-pay

## 2023-03-24 ENCOUNTER — Encounter (HOSPITAL_COMMUNITY): Payer: Self-pay | Admitting: Emergency Medicine

## 2023-03-24 DIAGNOSIS — Z8616 Personal history of COVID-19: Secondary | ICD-10-CM | POA: Insufficient documentation

## 2023-03-24 DIAGNOSIS — I509 Heart failure, unspecified: Secondary | ICD-10-CM | POA: Insufficient documentation

## 2023-03-24 DIAGNOSIS — I110 Hypertensive heart disease with heart failure: Secondary | ICD-10-CM | POA: Insufficient documentation

## 2023-03-24 DIAGNOSIS — J449 Chronic obstructive pulmonary disease, unspecified: Secondary | ICD-10-CM | POA: Diagnosis not present

## 2023-03-24 DIAGNOSIS — Z79899 Other long term (current) drug therapy: Secondary | ICD-10-CM | POA: Insufficient documentation

## 2023-03-24 DIAGNOSIS — Z7982 Long term (current) use of aspirin: Secondary | ICD-10-CM | POA: Insufficient documentation

## 2023-03-24 DIAGNOSIS — E119 Type 2 diabetes mellitus without complications: Secondary | ICD-10-CM | POA: Diagnosis not present

## 2023-03-24 DIAGNOSIS — R079 Chest pain, unspecified: Secondary | ICD-10-CM | POA: Insufficient documentation

## 2023-03-24 DIAGNOSIS — Z7984 Long term (current) use of oral hypoglycemic drugs: Secondary | ICD-10-CM | POA: Diagnosis not present

## 2023-03-24 DIAGNOSIS — R519 Headache, unspecified: Secondary | ICD-10-CM | POA: Diagnosis not present

## 2023-03-24 DIAGNOSIS — I2510 Atherosclerotic heart disease of native coronary artery without angina pectoris: Secondary | ICD-10-CM | POA: Insufficient documentation

## 2023-03-24 DIAGNOSIS — J45909 Unspecified asthma, uncomplicated: Secondary | ICD-10-CM | POA: Diagnosis not present

## 2023-03-24 DIAGNOSIS — R0789 Other chest pain: Secondary | ICD-10-CM | POA: Diagnosis not present

## 2023-03-24 DIAGNOSIS — Q2547 Right aortic arch: Secondary | ICD-10-CM | POA: Diagnosis not present

## 2023-03-24 DIAGNOSIS — Z1152 Encounter for screening for COVID-19: Secondary | ICD-10-CM | POA: Insufficient documentation

## 2023-03-24 LAB — TROPONIN I (HIGH SENSITIVITY)
Troponin I (High Sensitivity): 20 ng/L — ABNORMAL HIGH (ref <18)
Troponin I (High Sensitivity): 20 ng/L — ABNORMAL HIGH (ref <18)

## 2023-03-24 LAB — BASIC METABOLIC PANEL
Anion gap: 12 (ref 5–15)
BUN: 11 mg/dL (ref 6–20)
CO2: 23 mmol/L (ref 22–32)
Calcium: 9.5 mg/dL (ref 8.9–10.3)
Chloride: 100 mmol/L (ref 98–111)
Creatinine, Ser: 0.69 mg/dL (ref 0.61–1.24)
GFR, Estimated: >60 mL/min (ref >60)
Glucose, Bld: 148 mg/dL — ABNORMAL HIGH (ref 70–99)
Potassium: 3.8 mmol/L (ref 3.5–5.1)
Sodium: 135 mmol/L (ref 135–145)

## 2023-03-24 LAB — CBC
HCT: 48.6 % (ref 39.0–52.0)
Hemoglobin: 16.5 g/dL (ref 13.0–17.0)
MCH: 31.2 pg (ref 26.0–34.0)
MCHC: 34 g/dL (ref 30.0–36.0)
MCV: 91.9 fL (ref 80.0–100.0)
Platelets: 169 10*3/uL (ref 150–400)
RBC: 5.29 MIL/uL (ref 4.22–5.81)
RDW: 12.7 % (ref 11.5–15.5)
WBC: 7.5 10*3/uL (ref 4.0–10.5)
nRBC: 0 % (ref 0.0–0.2)

## 2023-03-24 LAB — SARS CORONAVIRUS 2 BY RT PCR: SARS Coronavirus 2 by RT PCR: NEGATIVE

## 2023-03-24 LAB — PROTIME-INR
INR: 1 (ref 0.8–1.2)
Prothrombin Time: 13.4 seconds (ref 11.4–15.2)

## 2023-03-24 MED ORDER — PROCHLORPERAZINE EDISYLATE 10 MG/2ML IJ SOLN
5.0000 mg | Freq: Once | INTRAMUSCULAR | Status: AC
  Administered 2023-03-24: 5 mg via INTRAVENOUS
  Filled 2023-03-24: qty 2

## 2023-03-24 NOTE — Discharge Instructions (Signed)
Follow-up with cardiology as needed.  Follow-up with your doctor also.

## 2023-03-24 NOTE — ED Triage Notes (Signed)
Pt via POV c/o chest pain since 0430am and HTN 168/107 this afternoon after 2 doses of NTG. Left-sided CP radiates to neck and left jaw, rated 8/10 intermittently, with headache, SOB, nausea, diaphoresis. Denies dizziness. PMH includes 3 heart attacks and angina.

## 2023-03-24 NOTE — ED Provider Notes (Signed)
[place] EMERGENCY DEPARTMENT AT [facility] Provider Note   CSN: [id number] Arrival date & time: 03/24/23  1519     History  Chief Complaint  Patient presents with   Chest Pain    Randy Hebert is a 57 y.o. male.   Chest Pain Patient has been feeling bad for around a week now.  States his head feels as if he is going through a tunnel.  Had a headache that is different than his normal headaches.  Took a nitroglycerin last night for some chest pain.  Chest pain is resolved.  However still feels off.  No fevers or chills.  Had some chest pain last night like his previous angina that went away with nitroglycerin.  No further pain since.  Previous MI.    Past Medical History:  Diagnosis Date   ACE inhibitor-aggravated angioedema    Allergy    Anemia    Angio-edema    Anxiety    Arthritis    Asthma    hip replacement   Back pain    Bradycardia 04/28/2017   Bulging of cervical intervertebral disc    CAD (coronary artery disease)    lateral STEMI 02/06/2015 00% D1 occlusion treated with Promus Premier 2.5 mm x 16 mm DES, 70% ramus stenosis, 40% mid RCA stenosis, 45% distal RCA stenosis, EF 45-50%   CHF (congestive heart failure) (HCC)    COPD (chronic obstructive pulmonary disease) (HCC)    Depression    Diabetes mellitus without complication (HCC)    Difficult intubation    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   Dry eye    Dyspnea    Early satiety 09/23/2016   Fatty liver    GERD (gastroesophageal reflux disease)    HCAP (healthcare-associated pneumonia) 05/15/2017   Headache    Heart murmur    Hip pain    Hyperlipidemia    Hypertension    Lobar pneumonia (HCC) 05/15/2017   Melena 08/04/2018   MI (myocardial infarction) [facility])    Myocardial infarction (HCC)    Neck pain    Non-ST elevation (NSTEMI) myocardial infarction (HCC) 04/27/2017   NSTEMI (non-ST elevated myocardial infarction) (HCC) 04/26/2017   Otitis media    Pleurisy     Pneumonia due to COVID-19 virus 10/07/2019   Rectal bleeding 11/08/2018   Right shoulder pain 03/20/2020   Sinus pause    9 sec sinus pause on telemetry after started on coreg after MI, avoid AV nodal blocking agent   Sleep apnea    uses a cpap   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 Authorizing Provider  acetaminophen (TYLENOL) 500 MG tablet Take 2 tablets (1,000 mg total) by mouth every 8 (eight) hours as needed for mild pain or fever. 09/06/21   Lurene Shadow, MD  albuterol (PROVENTIL) (2.5 MG/3ML) 0.083% nebulizer solution Take 3 mLs (2.5 mg total) by nebulization every 6 (six) hours as needed for wheezing or shortness of breath. 12/26/22   Everlene Other G, DO  albuterol (VENTOLIN HFA) 108 (90 Base) MCG/ACT inhaler INHALE 2 PUFFS INTO THE LUNGS EVERY 6 HOURS AS NEEDEDFOR SHORTNESS OF BREATH OR  WHEEZING. 08/08/21   Hetty Blend, FNP  amLODipine (NORVASC) 10 MG tablet TAKE 1 TABLET BY MOUTH ONCE A DAY. 09/03/22   Everlene Other G, DO  ascorbic acid (VITAMIN C) 500 MG tablet Take 1 tablet (500 mg total) by mouth daily. Patient not taking: Reported on 03/17/2023 10/12/19   Vassie Loll, MD  aspirin 81 MG EC tablet Take 1 tablet (81 mg total) by mouth daily. Patient taking differently: Take 81 mg by mouth in the morning and at bedtime. 04/29/17   Arty Baumgartner, NP  atorvastatin (LIPITOR) 80 MG tablet Take 1 tablet by mouth once daily 02/20/22   Everlene Other G, DO  azelastine (ASTELIN) 0.1 % nasal spray USE 2 SPRAYS IN EACH NOSTRIL TWICE DAILY. 03/14/22   Nyoka Cowden, MD  Blood Glucose Monitoring Suppl (ACCU-CHEK GUIDE ME) w/Device KIT 1 Piece by  Does not apply route as directed. 02/20/22   Roma Kayser, MD  budesonide-formoterol (SYMBICORT) 160-4.5 MCG/ACT inhaler Take 2 puffs first thing in am and then another 2 puffs about 12 hours later. 03/05/22   Nyoka Cowden, MD  buPROPion (WELLBUTRIN SR) 150 MG 12 hr tablet Take 1 tablet (150 mg total) by mouth 2 (two) times daily. 12/25/22   Tommie Sams, DO  Cholecalciferol (VITAMIN D3) 125 MCG (5000 UT) CAPS Take 1 capsule (5,000 Units total) by mouth daily. 07/19/19   Roma Kayser, MD  citalopram (CELEXA) 20 MG tablet Take 1 tablet (20 mg total) by mouth daily. 03/25/22   Tommie Sams, DO  cycloSPORINE (RESTASIS) 0.05 % ophthalmic emulsion Place 1 drop into both eyes 2 (two) times daily.    [provider]  dapagliflozin propanediol (FARXIGA) 10 MG TABS tablet Take 10 mg by mouth daily. 09/08/22   [provider]  Dulaglutide (TRULICITY) 1.5 MG/0.5ML SOPN INJECT 1.5 MG INTO THE SKIN ONCE WEEKLY. 01/28/23   Roma Kayser, MD  ezetimibe (ZETIA) 10 MG tablet Take 1 tablet (10 mg total) by mouth daily. 01/04/23   Tommie Sams, DO  ferrous sulfate 325 (65 FE) MG tablet Take 1 tablet (325 mg total) by mouth daily with breakfast. 04/02/21   Heather Roberts, NP  Fluocinolone Acetonide 0.01 % SHAM Wet hair and apply directly to dry areas on scalp; leave on for 5-10 minutes then rinse; use 2-3 times per week prn 02/05/23   Campbell Riches, NP  furosemide (LASIX) 40 MG tablet Take 1 tablet (40 mg total) by mouth daily as needed for fluid. 07/03/22   Antoine Poche, MD  glipiZIDE (GLUCOTROL XL) 5 MG 24 hr tablet TAKE 1 TABLET BY MOUTH DAILY WITH BREAKFAST. 08/08/22   Roma Kayser, MD  glucose blood (ACCU-CHEK GUIDE) test strip Use to test glucose 2 times daily. 02/20/22   Roma Kayser, MD  lactulose (CHRONULAC) 10 GM/15ML solution TAKE 15 MLS BY MOUTH ONCE DAILY. 08/27/22   Aida Raider, NP  magnesium gluconate (MAGONATE) 500 MG tablet Take 500 mg  by mouth 2 (two) times daily.    [provider]  metFORMIN (GLUCOPHAGE) 500 MG tablet TAKE 1 TABLET BY MOUTH TWICE DAILY WITH MEALS. 04/16/22   Roma Kayser, MD  mometasone (ELOCON) 0.1 % cream Apply 1 application topically daily as needed (eczema on ears). 10/21/18   [provider]  NEOMYCIN-POLYMYXIN-HYDROCORTISONE (CORTISPORIN) 1 % SOLN OTIC solution INSTILL 3 DROPS INTO AFFECTED EAR(S) 3 TIMES DAILY AS NEEDED. 03/09/23   Tommie Sams, DO  nitroGLYCERIN (NITROSTAT) 0.4 MG SL tablet Place 0.4 mg under the tongue every 5 (five) minutes as needed. 08/28/22   [provider]  olopatadine (PATANOL) 0.1 % ophthalmic solution Place 1 drop into both eyes 2 (two) times daily. 10/23/20   [provider]  pantoprazole (PROTONIX) 40 MG tablet TAKE (1) TABLET BY MOUTH TWICE DAILY BEFORE A MEAL. 06/30/22   Gelene Mink, NP  sildenafil (VIAGRA) 25 MG tablet Take 1 tablet (25 mg total) by mouth as needed for erectile dysfunction (1 hour prior to intercourse.). 02/24/23   Tommie Sams, DO      Allergies    Carvedilol, Lisinopril, Chantix [varenicline], and Amoxicillin    Review of Systems   Review of Systems  Cardiovascular:  Positive for chest pain.    Physical Exam Updated Vital Signs BP 135/85   Pulse 73   Temp 98.1 F (36.7 C) (Oral)   Resp (!) 24   Ht 5\' 5"  (1.651 m)   Wt 88.5 kg   SpO2 93%   BMI 32.45 kg/m  Physical Exam Vitals and nursing note reviewed.  HENT:     Head: Normocephalic.  Cardiovascular:     Rate and Rhythm: Regular rhythm.  Pulmonary:     Breath sounds: No wheezing.  Chest:     Chest wall: No tenderness.  Abdominal:     Tenderness: There is no abdominal tenderness.  Musculoskeletal:     Right lower leg: No edema.     Left lower leg: No edema.  Skin:    General: Skin is warm.  Neurological:     Mental Status: He is alert.     ED Results / Procedures / Treatments   Labs (all labs ordered are listed, but only  abnormal results are displayed) Labs Reviewed  BASIC METABOLIC PANEL - Abnormal; Notable for the following components:      Result Value   Glucose, Bld 148 (*)    All other components within normal limits  TROPONIN I (HIGH SENSITIVITY) - Abnormal; Notable for the following components:   Troponin I (High Sensitivity) 20 (*)    All other components within normal limits  TROPONIN I (HIGH SENSITIVITY) - Abnormal; Notable for the following components:   Troponin I (High Sensitivity) 20 (*)    All other components within normal limits  SARS CORONAVIRUS 2 BY RT PCR  CBC  PROTIME-INR    EKG EKG Interpretation Date/Time:  Tuesday March 24 2023 15:29:33 EDT Ventricular Rate:  71 PR Interval:  208 QRS Duration:  138 QT Interval:  436 QTC Calculation: 473 R Axis:   120  Text Interpretation: Normal sinus rhythm Right bundle branch block Lateral infarct (cited on or before 16-Oct-2022) Cannot rule out Inferior infarct , age undetermined Abnormal ECG When compared with ECG of 16-Oct-2022 06:59, Premature ventricular complexes are no longer Present PR interval has decreased No significant change since last tracing Confirmed by Benjiman Core [phone]) on 03/24/2023 4:43:27 PM  Radiology CT Head Wo Contrast  Result Date: 03/24/2023 CLINICAL DATA:  Headache, increasing frequency or severity. EXAM: CT HEAD WITHOUT CONTRAST TECHNIQUE: Contiguous axial images were obtained from the base of the skull through the vertex without intravenous contrast. RADIATION DOSE REDUCTION: This exam was performed according to the departmental dose-optimization program which includes automated exposure control, adjustment of the mA and/or kV according to patient size and/or use of iterative reconstruction technique. COMPARISON:  Head CT 05/15/2017. FINDINGS: Brain: No acute intracranial hemorrhage. Gray-white differentiation is preserved. No hydrocephalus  or extra-axial collection. No mass effect or midline shift. Vascular:  No hyperdense vessel or unexpected calcification. Skull: No calvarial fracture or suspicious bone lesion. Skull base is unremarkable. Sinuses/Orbits: Unremarkable. Other: None. IMPRESSION: No acute intracranial abnormality. Electronically Signed   By: Orvan Falconer M.D.   On: 03/24/2023 18:49   DG Chest 2 View  Result Date: 03/24/2023 CLINICAL DATA:  Chest pain EXAM: CHEST - 2 VIEW COMPARISON:  X-ray 11/03/2022 FINDINGS: Known right-sided aortic arch. Normal cardiopericardial silhouette. Overlapping left-sided loop recorder. Stable interstitial prominence. No consolidation, pneumothorax or effusion. No edema. Right apical marking clip noted. IMPRESSION: No acute cardiopulmonary disease.  Chronic changes. Loop recorder.  Known right-sided aortic arch Electronically Signed   By: Karen Kays M.D.   On: 03/24/2023 16:06    Procedures Procedures    Medications Ordered in ED Medications  prochlorperazine (COMPAZINE) injection 5 mg (5 mg Intravenous Given 03/24/23 1727)    ED Course/ Medical Decision Making/ A&P                             Medical Decision Making Amount and/or Complexity of Data Reviewed Labs: ordered. Radiology: ordered.  Risk Prescription drug management.   Patient with headache and chest pain.  Chest pain was last night and resolved.  No further episodes today.  States he did feel his heart fluttering with the event.  Blood work reassuring.  Troponin slightly elevated but stable at 20.  This appears to be near his baseline.  Negative head CT.  Feels better after treatment for the headache.  Blood pressures improved.  Doubt cardiac ischemia as a cause.  Potentially did have some arrhythmia does have a loop recorder.  Will have follow-up with cardiology.  Negative COVID testing.  Discharge home.        Final Clinical Impression(s) / ED Diagnoses Final diagnoses:  Nonspecific chest pain    Rx / DC Orders ED Discharge Orders          Ordered    Ambulatory  referral to Cardiology       Comments: If you have not heard from the Cardiology office within the next 72 hours please call [phone].   03/24/23 1912              Benjiman Core, MD 03/24/23 [phone]

## 2023-03-24 NOTE — ED Notes (Signed)
Pt states his head is hurting. Level 10. States he has take 6x (500mg ) tylenol today along with 81mg  ASA along with Nitroglycerin at 0400 and around lunch time.

## 2023-03-31 ENCOUNTER — Other Ambulatory Visit: Payer: Self-pay | Admitting: Family Medicine

## 2023-04-01 ENCOUNTER — Telehealth: Payer: Self-pay

## 2023-04-01 NOTE — Telephone Encounter (Signed)
Transition Care Management Unsuccessful Follow-up Telephone Call  Date of discharge and from where:  [facility] 7/23  Attempts:  1st Attempt  Reason for unsuccessful TCM follow-up call:  No answer/busy   [name] [facility] Guide, [facility] [phone] [address] [place], [place], [place] [address] Phone: [phone] Email: [email or web address]

## 2023-04-01 NOTE — Telephone Encounter (Signed)
Transition Care Management Unsuccessful Follow-up Telephone Call  Date of discharge and from where:  [facility]  Attempts:  2nd Attempt  Reason for unsuccessful TCM follow-up call:  No answer/busy   [name] [facility] Guide, [facility] [phone] [address] [place], [place], [place] [address] Phone: [phone] Email: [email or web address]

## 2023-04-07 ENCOUNTER — Ambulatory Visit: Payer: 59 | Attending: Nurse Practitioner | Admitting: Nurse Practitioner

## 2023-04-07 ENCOUNTER — Other Ambulatory Visit: Payer: Self-pay | Admitting: "Endocrinology

## 2023-04-07 ENCOUNTER — Other Ambulatory Visit: Payer: Self-pay | Admitting: Family Medicine

## 2023-04-07 VITALS — BP 125/76 | HR 84 | Wt 197.0 lb

## 2023-04-07 DIAGNOSIS — I5032 Chronic diastolic (congestive) heart failure: Secondary | ICD-10-CM

## 2023-04-07 DIAGNOSIS — E669 Obesity, unspecified: Secondary | ICD-10-CM

## 2023-04-07 DIAGNOSIS — I700 Atherosclerosis of aorta: Secondary | ICD-10-CM | POA: Diagnosis not present

## 2023-04-07 DIAGNOSIS — E785 Hyperlipidemia, unspecified: Secondary | ICD-10-CM

## 2023-04-07 DIAGNOSIS — I2510 Atherosclerotic heart disease of native coronary artery without angina pectoris: Secondary | ICD-10-CM

## 2023-04-07 DIAGNOSIS — R911 Solitary pulmonary nodule: Secondary | ICD-10-CM

## 2023-04-07 DIAGNOSIS — E1159 Type 2 diabetes mellitus with other circulatory complications: Secondary | ICD-10-CM

## 2023-04-07 DIAGNOSIS — I10 Essential (primary) hypertension: Secondary | ICD-10-CM | POA: Diagnosis not present

## 2023-04-07 DIAGNOSIS — G4733 Obstructive sleep apnea (adult) (pediatric): Secondary | ICD-10-CM | POA: Diagnosis not present

## 2023-04-07 MED ORDER — NITROGLYCERIN 0.4 MG SL SUBL: 0.4000 mg | SUBLINGUAL_TABLET | SUBLINGUAL | 3 refills | Status: DC | PRN

## 2023-04-07 NOTE — Patient Instructions (Addendum)
Medication Instructions:  Your physician recommends that you continue on your current medications as directed. Please refer to the Current Medication list given to you today.  Labwork: none  Testing/Procedures: none  Follow-Up: Your physician recommends that you schedule a follow-up appointment in: 6 months with Dr. Branch  Any Other Special Instructions Will Be Listed Below (If Applicable).  If you need a refill on your cardiac medications before your next appointment, please call your pharmacy. 

## 2023-04-07 NOTE — Progress Notes (Signed)
Cardiology Office Note:  .   Date:  04/07/2023  ID:  Randy Hebert, DOB 19-Mar-1966, MRN [id number] PCP: Randy Sams DO  [facility] Providers Cardiologist:  Randy Rich, MD Electrophysiologist:  Randy Bunting, MD    History of Present Illness: .   Randy Hebert is a 57 y.o. male with a PMH of CAD, s/p STEMI in 2016, aortic atherosclerosis, HFimpEF, right-sided aortic arch, dilated main pulmonary artery, suggesting PAH, HLD, HTN, OSA, and cirrhosis, pulmonary nodule, past history of EtOH and cocaine use, hx of syncope, and obesity, who presents today for scheduled follow-up.   Hx of DES to D1 in 2016. NST in 2019 and NST in 2023 were both negative for ischemia.  TTE in December 2023 showed improved EF 50 to 55%.  Last seen by Randy An, PA-C on December 18, 2022.  He was overall doing well at that time from a cardiac perspective..  Denied any specific chest pain.  Saw Randy Hebert on March 17, 2023.  Was doing well with no arrhythmias.  Denied any syncopal episodes or CP.   ED visit at [facility] on March 24, 2019 for for chest pain.  Took NTG tablet, CP resolved.  Troponins slightly elevated, flat, appears to be at baseline.  Workup overall unremarkable.  Patient did admit to sensation of palpitations.  Was noted to have a loop recorder due to past history of syncope.  Today he presents for scheduled follow-up.  He states he is doing well, denies any recurrence in chest pain. Doing better with medication compliance per his report. Says he was concerned when he realized his DBP in ED that day was 107, says it has never been that high, but wonders if it was related to the hot weather. He knows not to push himself when the weather gets hot.  Says he was diagnosed with stage I lung cancer around February 2024, received 3 treatments of radiation.  He will be going for CT of his chest on August 19 for follow-up, has visit with Randy Hebert on August 22.  He is doing well from a cardiac  perspective. Denies any chest pain, shortness of breath, palpitations, syncope, presyncope, dizziness, orthopnea, PND, swelling or significant weight changes, acute bleeding, or claudication.  SH: Works at [employer] part time and also [employer].   Studies Reviewed: .    Echo 08/2022:  1. Left ventricular ejection fraction, by estimation, is 50 to 55%. The  left ventricle has low normal function. The left ventricle has no regional  wall motion abnormalities. Left ventricular diastolic parameters are  indeterminate.   2. Right ventricular systolic function is normal. The right ventricular  size is normal.   3. The mitral valve is normal in structure. No evidence of mitral valve  regurgitation. No evidence of mitral stenosis.   4. The aortic valve is tricuspid. There is mild calcification of the  aortic valve. There is mild thickening of the aortic valve. Aortic valve  regurgitation is not visualized. No aortic stenosis is present.   5. The inferior vena cava is normal in size with greater than 50%  respiratory variability, suggesting right atrial pressure of 3 mmHg.  Lexiscan 07/2022:    Findings are consistent with prior anterolatera/latera/inferolateral myocardial infarction. There is no current ischemia.  The study is high risk based on decreased LVEF alone,consider correlating with echo.   No ST deviation was noted.   LV perfusion is abnormal. Large anterolateral/lateral/inferolateral l defect moderate to severe  intensity that is fixed.   Left ventricular function is abnormal. Nuclear stress EF: 34 %. The left ventricular ejection fraction is moderately decreased (30-44%). End diastolic cavity size is mildly enlarged.  Physical Exam:   VS:  BP 125/76   Pulse 84   Wt 197 lb (89.4 kg)   SpO2 96%   BMI 32.78 kg/m    Wt Readings from Last 3 Encounters:  04/07/23 197 lb (89.4 kg)  03/24/23 195 lb (88.5 kg)  03/17/23 195 lb 6.4 oz (88.6 kg)    GEN: Obese, 57 y.o. male in no acute  distress NECK: No JVD; No carotid bruits CARDIAC: S1/S2, RRR, no murmurs, rubs, gallops RESPIRATORY:  Clear to auscultation without rales, wheezing or rhonchi  EXTREMITIES:  No edema; No deformity   ASSESSMENT AND PLAN: .    HFimpEF Stage C, NYHA class I-II symptoms. EF 50-55% 08/2022. Euvolemic and well compensated on exam. Continue Farxiga and Lasix PRN. GDMT is limited d/t hx of bradycardia with BB and angioedema d/t lisinopril therefore want to avoid ACEI/ARB/ARNI. Low sodium diet, fluid restriction <2L, and daily weights encouraged. Educated to contact our office for weight gain of 2 lbs overnight or 5 lbs in one week.  CAD, aortic atherosclerosis Stable with no anginal symptoms. No indication for ischemic evaluation. Recent ED workup unremarkable. Continue aspirin, atorvastatin, and zetia. Heart healthy diet and regular cardiovascular exercise encouraged. Will refill Nitroglycerin for PRN use for chest pain.   HTN BP stable. Discussed to monitor BP at home at least 2 hours after medications and sitting for 5-10 minutes.  Continue amlodipine. Heart healthy diet and regular cardiovascular exercise encouraged.   HLD LDL 12/2022 showed LDL 103. Goal LDL < 60.  Patient states he is getting better and medication compliance.  Continue atorvastatin and Zetia. Heart healthy diet and regular cardiovascular exercise encouraged. Continue to follow with PCP.   OSA, Dilated main pulmonary artery- PAH?, pulmonary nodule Encouraged continued compliance of CPAP.  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  This CT of chest also found a irregular solid 1.1 cm anterior apical right upper lobe pulmonary nodule which was suspicious for primary bronchogenic carcinoma.  Stated he received 3 treatments of radiation.  He will follow-up with pulmonology after his repeat CT scan of his chest. Continue to follow-up with  pulmonology as scheduled.   6. Obesity Weight loss via diet and exercise encouraged. Discussed the impact being overweight would have on cardiovascular risk.  Dispo: Follow-up in 6 months with Dr. Dina Hebert or APP or sooner if anything changes.  Signed, Sharlene Dory, NP

## 2023-04-20 ENCOUNTER — Ambulatory Visit (HOSPITAL_COMMUNITY)
Admission: RE | Admit: 2023-04-20 | Discharge: 2023-04-20 | Disposition: A | Discharge status: Home / Self Care | Payer: 59 | Source: Ambulatory Visit | Attending: Internal Medicine | Admitting: Internal Medicine

## 2023-04-20 ENCOUNTER — Ambulatory Visit (HOSPITAL_COMMUNITY)
Admission: RE | Admit: 2023-04-20 | Discharge: 2023-04-20 | Disposition: A | Discharge status: Home / Self Care | Payer: 59 | Source: Ambulatory Visit | Attending: Gastroenterology | Admitting: Gastroenterology

## 2023-04-20 DIAGNOSIS — K7460 Unspecified cirrhosis of liver: Secondary | ICD-10-CM | POA: Insufficient documentation

## 2023-04-20 DIAGNOSIS — K7689 Other specified diseases of liver: Secondary | ICD-10-CM | POA: Diagnosis not present

## 2023-04-20 DIAGNOSIS — Q2547 Right aortic arch: Secondary | ICD-10-CM | POA: Diagnosis not present

## 2023-04-20 DIAGNOSIS — Z0279 Encounter for issue of other medical certificate: Secondary | ICD-10-CM

## 2023-04-20 DIAGNOSIS — K824 Cholesterolosis of gallbladder: Secondary | ICD-10-CM | POA: Diagnosis not present

## 2023-04-20 DIAGNOSIS — C3491 Malignant neoplasm of unspecified part of right bronchus or lung: Secondary | ICD-10-CM

## 2023-04-20 DIAGNOSIS — C3411 Malignant neoplasm of upper lobe, right bronchus or lung: Secondary | ICD-10-CM | POA: Diagnosis not present

## 2023-04-21 ENCOUNTER — Ambulatory Visit (HOSPITAL_COMMUNITY): Payer: 59

## 2023-04-22 NOTE — Progress Notes (Signed)
Randy Hebert, male    DOB: 1965/12/03,   MRN: [id number]   Brief patient profile:  89  yowm  active smoker referred to pulmonary clinic in [place]  12/03/2021 by Dr Randy Hebert for ? 02 dep copd    Has seen Randy Hebert was on shots until 1st of 2023 did not feel they helped   Admit date: 09/02/2021 Discharge date: 09/06/2021   Discharge disposition: Home     Recommendations for Outpatient Follow-Up:    Follow-up With PCP in 1 week  Follow-up with gastroenterologist within 1 month of discharge to arrange for colonoscopy and surveillance EGD.   Discharge Diagnosis:    Principal Problem:   Upper GI bleed   Essential hypertension   Chronic systolic CHF (congestive heart failure) (HCC)   COPD (chronic obstructive pulmonary disease) (HCC)   Mixed hyperlipidemia   OSA (obstructive sleep apnea)   Hyperglycemia due to diabetes mellitus (HCC)   Acute respiratory failure with hypoxia (HCC)   Diarrhea   Cirrhosis of liver (HCC)   Influenza A   Hypoalbuminemia due to protein-calorie malnutrition (HCC)   Lactic acidosis   Elevated troponin   Obesity (BMI 30.0-34.9)   Hyperkalemia   Hyperammonemia [facility])              Hospital Course:    Randy Hebert is a 57 y.o. male with medical history significant for essential hypertension, T2DM, CAD, STEMI (2016), NSTEMI (2018), CHF, hyperlipidemia, COPD, liver cirrhosis, who presented to the hospital with pleuritic chest pain, cough, fever, nausea, vomiting, diarrhea, hematemesis and bloody stools.   He was found to have acute hypoxemic respiratory failure, influenza A infection and acute GI bleeding.  He was treated with Tamiflu, oxygen via nasal cannula, IV Protonix drip, IV octreotide drip and empiric IV antibiotics.  He had acute blood loss anemia but he did not require transfusion.  He also had hypokalemia and hypomagnesemia that improved with treatment.  He was treated with lactulose because of elevated ammonia levels.   He  underwent EGD which showed grade 1 esophageal varices, small hiatal hernia, portal hypertensive gastropathy and normal examined duodenum.  He could not be weaned off of oxygen.  However, he said he felt better and insisted on being discharged home.  He was discharged on 4 L/min oxygen via nasal cannula for chronic hypoxemic respiratory failure likely from his COPD.  He has been advised to avoid alcohol and cigarette smoking.     History of Present Illness  12/03/2021  Pulmonary/ 1st office eval/ Randy Hebert / [place] [facility] off 02 for a week prior to OV  and on cpap per Randy Hebert / symb  Chief Complaint  Patient presents with   Consult    Patient has hx of COPD and shortness of breath with exertion. Productive cough with white/yellow sputum. Patient wears CPAP. Has not worn oxygen in about a week, only slept with it.   Dyspnea:  MMRC1 = can walk nl pace, flat grade, can't hurry or go uphills or steps s sob   Cough: ? Worse p eating  Sleep: cpap flat bed 2 pillows =baseline  SABA use: neb three times a week / hfa p ex only Rec The key is to stop smoking completely before smoking completely stops you! You do have copd but do have issues with your weight and asthma Plan A = Automatic = Always=    Symbicort 160 is 2 every 12 hours until 100% then up to 2 puffs every 12 hours  Work on inhaler technique:  Plan B = Backup (to supplement plan A, not to replace it) Only use your albuterol inhaler as a rescue medication  Plan C = Crisis (instead of Plan B but only if Plan B stops working) - only use your albuterol nebulizer if you first try Plan B a Plan D = Doctor - call me if B and C not adequate Ok to stop all 0xygen Please schedule a follow up visit in 3 months but call sooner if needed  with all medications /inhalers/ solutions in hand    06/13/2022  f/u ov/Randy Hebert office/Randy Hebert re: AB maint on symbiocrt 160 / did not bring   Chief Complaint  Patient presents with   Follow-up    Breathing doing  better  Wants to discuss nicotine patches to help him stop smoking   Dyspnea:  weed eating /steps more than one flight but not fast walking slow  Cough: no but lots of nasal congestion white mucus  Sleeping: bed is flat/ flat bed/ cpap  SABA use: only p ex  02: none  Lung cancer screening: due 06/25/22 Rec Think of your albuterol like starter fluid and always use the inhaler before resorting to the nebulizer for any purpose Ok to try albuterol 15 min before an activity (on alternating days between inhaler / the nebulizer and nothing)   that you know would usually make you short of breath The key is to stop smoking completely before smoking completely stops you! Nicotine patch rec but read the instructions on the box   Please schedule a follow up visit in 6  months but call sooner if needed    04/23/2023  f/u ov/Randy Hebert office/Randy Hebert re: AB maint on symbicort 160 2bid     Chief Complaint  Patient presents with   Asthmatic bronchitis , chronic   Dyspnea:  yardwork, maint at [employer] and not limited by breathing  Cough: minimal /mucoid in am  Sleeping: cpap/ flat bed  no  resp cc  SABA use: none  02: none   Lung cancer screening: followed by RT x next 5 years per pt    No obvious day to day or daytime variability or assoc excess/ purulent sputum or mucus plugs or hemoptysis or cp or chest tightness, subjective wheeze or overt sinus or hb symptoms.    Also denies any obvious fluctuation of symptoms with weather or environmental changes or other aggravating or alleviating factors except as outlined above   No unusual exposure hx or h/o childhood pna/ asthma or knowledge of premature birth.  Current Allergies, Complete Past Medical History, Past Surgical History, Family History, and Social History were reviewed in [employer] record.  ROS  The following are not active complaints unless bolded Hoarseness, sore throat, dysphagia, dental problems, itching,  sneezing,  nasal congestion or discharge of excess mucus or purulent secretions, ear ache,   fever, chills, sweats, unintended wt loss or wt gain, classically pleuritic or exertional cp,  orthopnea pnd or arm/hand swelling  or leg swelling, presyncope, palpitations, abdominal pain, anorexia, nausea, vomiting, diarrhea  or change in bowel habits or change in bladder habits, change in stools or change in urine, dysuria, hematuria,  rash, arthralgias, visual complaints, headache, numbness, weakness or ataxia or problems with walking or coordination,  change in Hebert or  memory.        Current Meds  Medication Sig   acetaminophen (TYLENOL) 500 MG tablet Take 2 tablets (1,000 mg total) by mouth every 8 (  eight) hours as needed for mild pain or fever.   albuterol (PROVENTIL) (2.5 MG/3ML) 0.083% nebulizer solution Take 3 mLs (2.5 mg total) by nebulization every 6 (six) hours as needed for wheezing or shortness of breath.   albuterol (VENTOLIN HFA) 108 (90 Base) MCG/ACT inhaler INHALE 2 PUFFS INTO THE LUNGS EVERY 6 HOURS AS NEEDEDFOR SHORTNESS OF BREATH OR WHEEZING.   amLODipine (NORVASC) 10 MG tablet TAKE 1 TABLET BY MOUTH ONCE A DAY.   ascorbic acid (VITAMIN C) 500 MG tablet Take 1 tablet (500 mg total) by mouth daily.   aspirin 81 MG EC tablet Take 1 tablet (81 mg total) by mouth daily. (Patient taking differently: Take 81 mg by mouth in the morning and at bedtime.)   atorvastatin (LIPITOR) 80 MG tablet Take 1 tablet by mouth once daily   azelastine (ASTELIN) 0.1 % nasal spray USE 2 SPRAYS IN EACH NOSTRIL TWICE DAILY.   Blood Glucose Monitoring Suppl (ACCU-CHEK GUIDE ME) w/Device KIT 1 Piece by Does not apply route as directed.   budesonide-formoterol (SYMBICORT) 160-4.5 MCG/ACT inhaler Take 2 puffs first thing in am and then another 2 puffs about 12 hours later.   buPROPion (WELLBUTRIN SR) 150 MG 12 hr tablet Take 1 tablet (150 mg total) by mouth 2 (two) times daily.   Cholecalciferol (VITAMIN D3) 125 MCG  (5000 UT) CAPS Take 1 capsule (5,000 Units total) by mouth daily.   citalopram (CELEXA) 20 MG tablet Take 1 tablet (20 mg total) by mouth daily.   cycloSPORINE (RESTASIS) 0.05 % ophthalmic emulsion Place 1 drop into both eyes 2 (two) times daily.   dapagliflozin propanediol (FARXIGA) 10 MG TABS tablet Take 10 mg by mouth daily.   Dulaglutide (TRULICITY) 1.5 MG/0.5ML SOPN INJECT 1.5 MG INTO THE SKIN ONCE WEEKLY.   ezetimibe (ZETIA) 10 MG tablet Take 1 tablet (10 mg total) by mouth daily.   ferrous sulfate 325 (65 FE) MG tablet Take 1 tablet (325 mg total) by mouth daily with breakfast.   Fluocinolone Acetonide 0.01 % SHAM Wet hair and apply directly to dry areas on scalp; leave on for 5-10 minutes then rinse; use 2-3 times per week prn   furosemide (LASIX) 40 MG tablet Take 1 tablet (40 mg total) by mouth daily as needed for fluid.   gabapentin (NEURONTIN) 300 MG capsule Take 300 mg by mouth 3 (three) times daily.   glipiZIDE (GLUCOTROL XL) 5 MG 24 hr tablet TAKE 1 TABLET BY MOUTH DAILY WITH BREAKFAST.   glucose blood (ACCU-CHEK GUIDE) test strip Use to test glucose 2 times daily.   lactulose (CHRONULAC) 10 GM/15ML solution TAKE 15 MLS BY MOUTH ONCE DAILY.   magnesium gluconate (MAGONATE) 500 MG tablet Take 500 mg by mouth 2 (two) times daily.   metFORMIN (GLUCOPHAGE) 500 MG tablet TAKE 1 TABLET BY MOUTH TWICE DAILY WITH MEALS.   mometasone (ELOCON) 0.1 % cream Apply 1 application topically daily as needed (eczema on ears).   NEOMYCIN-POLYMYXIN-HYDROCORTISONE (CORTISPORIN) 1 % SOLN OTIC solution INSTILL 3 DROPS INTO AFFECTED EAR(S) 3 TIMES DAILY AS NEEDED.   nitroGLYCERIN (NITROSTAT) 0.4 MG SL tablet Place 1 tablet (0.4 mg total) under the tongue every 5 (five) minutes x 3 doses as needed (if no relief after 3rd dose, proceed to ED or call 911).   olopatadine (PATANOL) 0.1 % ophthalmic solution Place 1 drop into both eyes 2 (two) times daily.   pantoprazole (PROTONIX) 40 MG tablet TAKE (1) TABLET  BY MOUTH TWICE DAILY BEFORE A MEAL.  sildenafil (VIAGRA) 25 MG tablet TAKE 1 TABLET BY MOUTH AS NEEDED FOR ERECTILE DYSFUNCTION 1 HOUR PRIOR TO INTERCOURSE.          Past Medical History:  Diagnosis Date   ACE inhibitor-aggravated angioedema    Allergy    Angio-edema    Anxiety    Arthritis    Asthma    hip replacement   Back pain    Bradycardia 04/28/2017   Bulging of cervical intervertebral disc    CAD (coronary artery disease)    lateral STEMI 02/06/2015 00% D1 occlusion treated with Promus Premier 2.5 mm x 16 mm DES, 70% ramus stenosis, 40% mid RCA stenosis, 45% distal RCA stenosis, EF 45-50%   CHF (congestive heart failure) (HCC)    COPD (chronic obstructive pulmonary disease) (HCC)    Depression    Diabetes mellitus without complication (HCC)    Difficult intubation    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   Dry eye    Dyspnea    Early satiety 09/23/2016   GERD (gastroesophageal reflux disease)    HCAP (healthcare-associated pneumonia) 05/15/2017   Headache    Heart murmur    Hip pain    Hyperlipidemia    Hypertension    Lobar pneumonia (HCC) 05/15/2017   Melena 08/04/2018   MI (myocardial infarction) [facility])    Myocardial infarction (HCC)    Neck pain    Non-ST elevation (NSTEMI) myocardial infarction (HCC) 04/27/2017   NSTEMI (non-ST elevated myocardial infarction) (HCC) 04/26/2017   Otitis media    Pleurisy    Pneumonia due to COVID-19 virus 10/07/2019   Rectal bleeding 11/08/2018   Right shoulder pain 03/20/2020   Sinus pause    9 sec sinus pause on telemetry after started on coreg after MI, avoid AV nodal blocking agent   Sleep apnea    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   STEMI (ST elevation myocardial infarction) (HCC) 05/31/2015   Substance abuse (HCC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   Syncope 06/29/2017   Syncope and collapse 05/15/2017   Transaminitis 08/04/2018   Unilateral primary osteoarthritis, right hip 07/01/2016      Objective:     Wts  04/23/2023       194  06/13/2022     200   03/05/22 193 lb 9.6 oz (87.8 kg)  02/20/22 198 lb (89.8 kg)  02/20/22 197 lb 12.8 oz (89.7 kg)     Vital signs reviewed  04/23/2023  - Note at rest 02 sats  94% on RA   General appearance:    obese amb wm nad    HEENT : Oropharynx  clear        NECK :  without  apparent JVD/ palpable Nodes/TM    LUNGS: no acc muscle use,  Nl contour chest which is clear to A and P bilaterally without cough on insp or exp maneuvers   CV:  RRR  no s3 or murmur or increase in P2, and no edema   ABD: pot belly contour/ soft and nontender   MS:  Nl gait/ ext warm without deformities Or obvious joint restrictions  calf tenderness, cyanosis or clubbing    SKIN: warm and dry without lesions    NEURO:  alert, approp, nl sensorium with  no motor or cerebellar deficits apparent.             Assessment    Asthmatic bronchitis , chronic Assessment & Plan: Active smoker - PFTs 07/13/15 nl x  for ERV 18% at wt 220 - 12/03/2021   Walked on RA  x  3  lap(s) =  approx 450  ft  @ mod fast pace, stopped due to end of study, min sob with lowest 02 sats 92%  - 12/03/2021  After extensive coaching inhaler device,  effectiveness =    75% (short Ti) > continue symbicort 160 up to 2 q 12 h prn  - PFTs  08/20/22 no airflow obst p symbicort/  ERV still low at 200 lbs   .- The proper method of use, as well as anticipated side effects, of a metered-dose inhaler were discussed and demonstrated to the patient using teach back method and an empty symbicort cannister   His main problem at this point is AB from ongoing smoking and obesity so challenge is to both quit smoking and lose wt which I acknowledged was going to be tough but nothing else to offer at this point    Syncope and collapse Assessment & Plan: He has been cleared by cards but needs neurology eval before he can get back his DL  Orders: -     Ambulatory referral to Neurology  Current smoker Assessment  & Plan: Counseled re importance of smoking cessation but did not meet time criteria for separate billing           Each maintenance medication was reviewed in detail including emphasizing most importantly the difference between maintenance and prns and under what circumstances the prns are to be triggered using an action plan format where appropriate.  Total time for H and P, chart review, counseling, reviewing hfa  device(s) and generating customized AVS unique to this office visit / same day charting = 24 min         Sandrea Hughs, MD [facility]

## 2023-04-23 ENCOUNTER — Encounter: Payer: Self-pay | Admitting: Internal Medicine

## 2023-04-23 ENCOUNTER — Ambulatory Visit (INDEPENDENT_AMBULATORY_CARE_PROVIDER_SITE_OTHER): Payer: 59 | Admitting: Internal Medicine

## 2023-04-23 VITALS — BP 138/77 | HR 82 | Ht 65.0 in | Wt 194.0 lb

## 2023-04-23 DIAGNOSIS — R55 Syncope and collapse: Secondary | ICD-10-CM

## 2023-04-23 DIAGNOSIS — J4489 Other specified chronic obstructive pulmonary disease: Secondary | ICD-10-CM | POA: Diagnosis not present

## 2023-04-23 DIAGNOSIS — F419 Anxiety disorder, unspecified: Secondary | ICD-10-CM | POA: Insufficient documentation

## 2023-04-23 DIAGNOSIS — F17200 Nicotine dependence, unspecified, uncomplicated: Secondary | ICD-10-CM | POA: Diagnosis not present

## 2023-04-23 DIAGNOSIS — R918 Other nonspecific abnormal finding of lung field: Secondary | ICD-10-CM | POA: Insufficient documentation

## 2023-04-23 DIAGNOSIS — E611 Iron deficiency: Secondary | ICD-10-CM | POA: Insufficient documentation

## 2023-04-23 NOTE — Assessment & Plan Note (Addendum)
Active smoker - PFTs 07/13/15 nl x for ERV 18% at wt 220 - 12/03/2021   Walked on RA  x  3  lap(s) =  approx 450  ft  @ mod fast pace, stopped due to end of study, min sob with lowest 02 sats 92%  - 12/03/2021  After extensive coaching inhaler device,  effectiveness =    75% (short Ti) > continue symbicort 160 up to 2 q 12 h prn  - PFTs  08/20/22 no airflow obst p symbicort/  ERV still low at 200 lbs   .- The proper method of use, as well as anticipated side effects, of a metered-dose inhaler were discussed and demonstrated to the patient using teach back method and an empty symbicort cannister   His main problem at this point is AB from ongoing smoking and obesity so challenge is to both quit smoking and lose wt which I acknowledged was going to be tough but nothing else to offer at this point

## 2023-04-23 NOTE — Patient Instructions (Addendum)
No change in your medications  The key is to stop smoking completely before smoking completely stops you!    Please schedule a follow up visit in 12 months but call sooner if needed

## 2023-04-24 ENCOUNTER — Telehealth: Payer: Self-pay | Admitting: Cardiology

## 2023-04-24 NOTE — Assessment & Plan Note (Signed)
He has been cleared by cards but needs neurology eval before he can get back his DL

## 2023-04-24 NOTE — Assessment & Plan Note (Signed)
Counseled re importance of smoking cessation but did not meet time criteria for separate billing            Each maintenance medication was reviewed in detail including emphasizing most importantly the difference between maintenance and prns and under what circumstances the prns are to be triggered using an action plan format where appropriate.  Total time for H and P, chart review, counseling, reviewing hfa device(s) and generating customized AVS unique to this office visit / same day charting = 24 min       . 

## 2023-04-24 NOTE — Telephone Encounter (Signed)
Pt called in for an update on his DMV paperwork and if it has been looked at yet. Documents are scanned into Media tab.

## 2023-04-24 NOTE — Telephone Encounter (Signed)
Left a detailed message on pt's answering machine that provider is in office today and will call him to pick up.

## 2023-04-28 ENCOUNTER — Ambulatory Visit: Payer: 59 | Admitting: "Endocrinology

## 2023-04-30 ENCOUNTER — Encounter: Payer: Self-pay | Admitting: Neurology

## 2023-04-30 ENCOUNTER — Ambulatory Visit (INDEPENDENT_AMBULATORY_CARE_PROVIDER_SITE_OTHER): Payer: 59 | Admitting: Gastroenterology

## 2023-04-30 ENCOUNTER — Encounter: Payer: Self-pay | Admitting: Gastroenterology

## 2023-04-30 VITALS — BP 149/83 | HR 83 | Temp 98.4°F | Ht 65.0 in | Wt 195.6 lb

## 2023-04-30 DIAGNOSIS — K7030 Alcoholic cirrhosis of liver without ascites: Secondary | ICD-10-CM | POA: Diagnosis not present

## 2023-04-30 DIAGNOSIS — K2270 Barrett's esophagus without dysplasia: Secondary | ICD-10-CM

## 2023-04-30 NOTE — Patient Instructions (Signed)
Please complete the blood work.   I recommend Hepatitis A and B vaccinations.  We are arranging an upper endoscopy in the near future. You will need to stop [place] for 72 hours prior, hold Trulicity X 1 week prior, and no metformin the day of the procedure.  It's absolutely important to avoid alcohol.   We will see you in 6 months!  I enjoyed seeing you again today! I value our relationship and want to provide genuine, compassionate, and quality care. You may receive a survey regarding your visit with me, and I welcome your feedback! Thanks so much for taking the time to complete this. I look forward to seeing you again.      Gelene Mink, PhD, ANP-BC [facility]

## 2023-04-30 NOTE — Progress Notes (Signed)
Gastroenterology Office Note     Primary Care Physician:  Tommie Sams, DO  Primary Gastroenterologist: Dr. Jena Gauss    Chief Complaint   Chief Complaint  Patient presents with   Follow-up    Follow up cirrhosis     History of Present Illness   JOSHVA PENNINO is a 57 y.o. 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 Received blood transfusion. EGD while inpatient with three columns of Grade 1 varices in distal esophagus but no stigmata of bleeding or red wale signs. Small hiatal hernia. Mild portal gastropathy. 1 year surveillance needed and overdue currently. colonoscopy October 18, 2021 with nonbleeding internal hemorrhoids, small and large mouth diverticula found in the sigmoid, descending, transverse colon.  A 9 mm sessile polyp was found in the ascending colon that was removed.  4 sessile polyps found in the sigmoid, descending, transverse colon 3 to 5 mm in size, examining otherwise.  Path revealed tubular adenomas.  Repeat due in 5 years for surveillance.    Due for EGD for surveillance. He has had recent US with 6 mm gallbladder polyp. Serial ultrasounds are undertaken due to cirrhosis. Continues to drink alcohol. One cold beer every few days. Purposefully losing weight. Laying off honey buns. Leaving red meat alone. Eating fresh fruits and veggies.   Pantoprazole BID controlling GERD.  No mental status changes, confusion, jaundice ,pruritus, abdominal distension, lower extremity edema.   Past Medical History:  Diagnosis Date   ACE inhibitor-aggravated angioedema    Allergy    Anemia    Angio-edema    Anxiety    Arthritis    Asthma    hip replacement   Back pain    Bradycardia 04/28/2017   Bulging of cervical intervertebral disc    CAD (coronary artery disease)    lateral STEMI 02/06/2015 00% D1 occlusion treated with Promus Premier 2.5 mm x 16 mm DES, 70% ramus stenosis, 40% mid RCA  stenosis, 45% distal RCA stenosis, EF 45-50%   CHF (congestive heart failure) (HCC)    COPD (chronic obstructive pulmonary disease) (HCC)    Depression    Diabetes mellitus without complication (HCC)    Difficult intubation    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   Dry eye    Dyspnea    Early satiety 09/23/2016   Fatty liver    GERD (gastroesophageal reflux disease)    HCAP (healthcare-associated pneumonia) 05/15/2017   Headache    Heart murmur    Hip pain    Hyperlipidemia    Hypertension    Lobar pneumonia (HCC) 05/15/2017   Melena 08/04/2018   MI (myocardial infarction) [facility])    Myocardial infarction (HCC)    Neck pain    Non-ST elevation (NSTEMI) myocardial infarction (HCC) 04/27/2017   NSTEMI (non-ST elevated myocardial infarction) (HCC) 04/26/2017   Otitis media    Pleurisy    Pneumonia due to COVID-19 virus 10/07/2019   Rectal bleeding 11/08/2018   Right shoulder pain 03/20/2020   Sinus pause    9 sec sinus pause on telemetry after started on coreg after MI, avoid AV nodal blocking agent   Sleep apnea    uses a cpap   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   STEMI (ST elevation myocardial infarction) (HCC) 05/31/2015   Substance abuse (HCC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   Syncope 06/29/2017   Syncope and collapse 05/15/2017   Transaminitis  08/04/2018   Unilateral primary osteoarthritis, right hip 07/01/2016    Past Surgical History:  Procedure Laterality Date   BIOPSY  10/09/2016   Procedure: BIOPSY;  Surgeon: Corbin Ade, MD;  [facility];  Service: Endoscopy;;   BIOPSY  11/28/2019   Procedure: BIOPSY;  Surgeon: Corbin Ade, MD;  Location: [facility];  Service: Endoscopy;;   BRONCHIAL BIOPSY  10/16/2022   Procedure: BRONCHIAL BIOPSIES;  Surgeon: Omar Person, MD;  Location: [facility] ENDOSCOPY;  Service: Pulmonary;;   BRONCHIAL NEEDLE ASPIRATION BIOPSY  10/16/2022   Procedure: BRONCHIAL NEEDLE ASPIRATION BIOPSIES;  Surgeon:  Omar Person, MD;  Location: [facility] ENDOSCOPY;  Service: Pulmonary;;   BRONCHIAL WASHINGS  10/16/2022   Procedure: BRONCHIAL WASHINGS;  Surgeon: Omar Person, MD;  Location: [facility] ENDOSCOPY;  Service: Pulmonary;;   CARDIAC CATHETERIZATION N/A 02/06/2015   Procedure: Left Heart Cath and Coronary Angiography;  Surgeon: Marykay Lex, MD;  Location: [facility] INVASIVE CV LAB;  Service: Cardiovascular;  Laterality: N/A;   CARDIAC CATHETERIZATION N/A 02/06/2015   Procedure: Coronary Stent Intervention;  Surgeon: Marykay Lex, MD;  Location: [facility] INVASIVE CV LAB;  Service: Cardiovascular;  Laterality: N/A;   COLONOSCOPY WITH PROPOFOL N/A 10/09/2016   Sigmoid and descending colon diverticulosis, four 4-6 mm polyps in sigmoid, one 4 mm polyp in descending. Tubular adenomas and hyperplastic. 5 year surveillance.    COLONOSCOPY WITH PROPOFOL N/A 11/24/2016   Sigmoid and descending colon diverticulosis, four 4-6 mm polyps in sigmoid, one 4 mm polyp in descending. Tubular adenomas and hyperplastic. 5 year surveillance.    COLONOSCOPY WITH PROPOFOL N/A 10/18/2021   Carver: nonbleeding internal hemorrhoids, small and large mouth diverticula found in the sigmoid, descending, transverse colon.  A 9 mm sessile polyp was found in the ascending colon that was removed.  4 sessile polyps found in the sigmoid, descending, transverse colon 3 to 5 mm in size, examining otherwise.  Path revealed tubular adenomas.  Repeat due in 5 years for surveillance.   CORONARY ANGIOPLASTY WITH STENT PLACEMENT  01/2015   CORONARY STENT INTERVENTION N/A 04/27/2017   Procedure: CORONARY STENT INTERVENTION;  Surgeon: Yvonne Kendall, MD;  Location: [facility] INVASIVE CV LAB;  Service: Cardiovascular;  Laterality: N/A;   ELECTROPHYSIOLOGY STUDY N/A 06/29/2017   Procedure: ELECTROPHYSIOLOGY STUDY;  Surgeon: Marinus Maw, MD;  Location: [facility] INVASIVE CV LAB;  Service: Cardiovascular;  Laterality: N/A;   ESOPHAGOGASTRODUODENOSCOPY (EGD) WITH  PROPOFOL N/A 10/09/2016   Dr. Jena Gauss: LA grade a esophagitis.  Barrett's esophagus, biopsy-proven.  Small hiatal hernia.  EGD February 2019   ESOPHAGOGASTRODUODENOSCOPY (EGD) WITH PROPOFOL N/A 11/28/2019    salmon-colored esophageal mucosa (Barrett's) small hiatal hernia, portal hypertensive gastropathy, normal duodenum, 3 year surveillance   ESOPHAGOGASTRODUODENOSCOPY (EGD) WITH PROPOFOL N/A 09/06/2021   three columns of grade 1 varices in distal esophagus, no stigmata of bleeding or red wale signs. Small hiatal hernia. Mild portal gastropathy. Normal duodenum. Repeat upper endoscopy in 1 year for surveillance.   FIDUCIAL MARKER PLACEMENT  10/16/2022   Procedure: FIDUCIAL MARKER PLACEMENT;  Surgeon: Omar Person, MD;  Location: [facility] ENDOSCOPY;  Service: Pulmonary;;   INCISION / DRAINAGE HAND / FINGER     LEFT HEART CATH AND CORONARY ANGIOGRAPHY N/A 04/27/2017   Procedure: LEFT HEART CATH AND CORONARY ANGIOGRAPHY;  Surgeon: Yvonne Kendall, MD;  Location: [facility] INVASIVE CV LAB;  Service: Cardiovascular;  Laterality: N/A;   LOOP RECORDER INSERTION  06/29/2017   Procedure: Loop Recorder Insertion;  Surgeon: Lewayne Bunting  W, MD;  Location: [facility] INVASIVE CV LAB;  Service: Cardiovascular;;   POLYPECTOMY  11/24/2016   Procedure: POLYPECTOMY;  Surgeon: Corbin Ade, MD;  [facility]: [facility];  Service: Endoscopy;;  descending and sigmoid   POLYPECTOMY  10/18/2021   Procedure: POLYPECTOMY;  Surgeon: Lanelle Bal, DO;  [facility]: [facility];  Service: Endoscopy;;   TOTAL HIP ARTHROPLASTY Right 07/01/2016   TOTAL HIP ARTHROPLASTY Right 07/01/2016   Procedure: RIGHT TOTAL HIP ARTHROPLASTY ANTERIOR APPROACH;  Surgeon: Kathryne Hitch, MD;  Location: [facility] OR;  Service: Orthopedics;  Laterality: Right;    Current Outpatient Medications  Medication Sig Dispense Refill   acetaminophen (TYLENOL) 500 MG tablet Take 2 tablets (1,000 mg total) by mouth every 8 (eight) hours as needed for mild pain  or fever.     albuterol (PROVENTIL) (2.5 MG/3ML) 0.083% nebulizer solution Take 3 mLs (2.5 mg total) by nebulization every 6 (six) hours as needed for wheezing or shortness of breath. 150 mL 1   albuterol (VENTOLIN HFA) 108 (90 Base) MCG/ACT inhaler INHALE 2 PUFFS INTO THE LUNGS EVERY 6 HOURS AS NEEDEDFOR SHORTNESS OF BREATH OR WHEEZING. 18 g 1   amLODipine (NORVASC) 10 MG tablet TAKE 1 TABLET BY MOUTH ONCE A DAY. 90 tablet 0   ascorbic acid (VITAMIN C) 500 MG tablet Take 1 tablet (500 mg total) by mouth daily. 30 tablet 1   aspirin 81 MG EC tablet Take 1 tablet (81 mg total) by mouth daily. (Patient taking differently: Take 81 mg by mouth in the morning and at bedtime.) 30 tablet    atorvastatin (LIPITOR) 80 MG tablet Take 1 tablet by mouth once daily 90 tablet 0   azelastine (ASTELIN) 0.1 % nasal spray USE 2 SPRAYS IN EACH NOSTRIL TWICE DAILY. 30 mL 11   Blood Glucose Monitoring Suppl (ACCU-CHEK GUIDE ME) w/Device KIT 1 Piece by Does not apply route as directed. 1 kit 0   budesonide-formoterol (SYMBICORT) 160-4.5 MCG/ACT inhaler Take 2 puffs first thing in am and then another 2 puffs about 12 hours later. 1 each 12   buPROPion (WELLBUTRIN SR) 150 MG 12 hr tablet Take 1 tablet (150 mg total) by mouth 2 (two) times daily. 180 tablet 0   Cholecalciferol (VITAMIN D3) 125 MCG (5000 UT) CAPS Take 1 capsule (5,000 Units total) by mouth daily. 90 capsule 0   citalopram (CELEXA) 20 MG tablet Take 1 tablet (20 mg total) by mouth daily. 90 tablet 3   cycloSPORINE (RESTASIS) 0.05 % ophthalmic emulsion Place 1 drop into both eyes 2 (two) times daily.     dapagliflozin propanediol (FARXIGA) 10 MG TABS tablet Take 10 mg by mouth daily.     Dulaglutide (TRULICITY) 1.5 MG/0.5ML SOPN INJECT 1.5 MG INTO THE SKIN ONCE WEEKLY. 6 mL 0   ezetimibe (ZETIA) 10 MG tablet Take 1 tablet (10 mg total) by mouth daily. 90 tablet 3   ferrous sulfate 325 (65 FE) MG tablet Take 1 tablet (325 mg total) by mouth daily with  breakfast. 90 tablet 1   Fluocinolone Acetonide 0.01 % SHAM Wet hair and apply directly to dry areas on scalp; leave on for 5-10 minutes then rinse; use 2-3 times per week prn 120 mL 0   furosemide (LASIX) 40 MG tablet Take 1 tablet (40 mg total) by mouth daily as needed for fluid. 30 tablet 11   gabapentin (NEURONTIN) 300 MG capsule Take 300 mg by mouth 3 (three) times daily.     glipiZIDE (GLUCOTROL  XL) 5 MG 24 hr tablet TAKE 1 TABLET BY MOUTH DAILY WITH BREAKFAST. 90 tablet 0   glucose blood (ACCU-CHEK GUIDE) test strip Use to test glucose 2 times daily. 100 each 2   lactulose (CHRONULAC) 10 GM/15ML solution TAKE 15 MLS BY MOUTH ONCE DAILY. 473 mL 0   magnesium gluconate (MAGONATE) 500 MG tablet Take 500 mg by mouth 2 (two) times daily.     metFORMIN (GLUCOPHAGE) 500 MG tablet TAKE 1 TABLET BY MOUTH TWICE DAILY WITH MEALS. 60 tablet 3   mometasone (ELOCON) 0.1 % cream Apply 1 application topically daily as needed (eczema on ears).     NEOMYCIN-POLYMYXIN-HYDROCORTISONE (CORTISPORIN) 1 % SOLN OTIC solution INSTILL 3 DROPS INTO AFFECTED EAR(S) 3 TIMES DAILY AS NEEDED. 10 mL 0   nitroGLYCERIN (NITROSTAT) 0.4 MG SL tablet Place 1 tablet (0.4 mg total) under the tongue every 5 (five) minutes x 3 doses as needed (if no relief after 3rd dose, proceed to ED or call 911). 25 tablet 3   olopatadine (PATANOL) 0.1 % ophthalmic solution Place 1 drop into both eyes 2 (two) times daily.     pantoprazole (PROTONIX) 40 MG tablet TAKE (1) TABLET BY MOUTH TWICE DAILY BEFORE A MEAL. 180 tablet 3   sildenafil (VIAGRA) 25 MG tablet TAKE 1 TABLET BY MOUTH AS NEEDED FOR ERECTILE DYSFUNCTION 1 HOUR PRIOR TO INTERCOURSE. 10 tablet 0   No current facility-administered medications for this visit.    Allergies as of 04/30/2023 - Review Complete 04/30/2023  Allergen Reaction Noted   Carvedilol Other (See Comments) 02/08/2015   Lisinopril Anaphylaxis, Shortness Of Breath, and Swelling 05/30/2015   Chantix [varenicline]  Other (See Comments) 07/16/2021   Amoxicillin Nausea Only 02/06/2015    Family History  Problem Relation Age of Onset   Heart attack Father    Stroke Father    Arthritis Father    Heart disease Father    Cancer Mother        ???   Arthritis Mother    Heart disease Brother 1       died in sleep   Early death Brother    Diabetes Maternal Uncle    Alzheimer's disease Maternal Grandmother     Social History   Socioeconomic History   Marital status: Divorced    Spouse name: alice   Number of children: 3   Years of education: 12   Highest education level: Not on file  Occupational History   Occupation: SSI  Tobacco Use   Smoking status: Every Day    Current packs/day: 1.00    Average packs/day: 1 pack/day for 46.0 years (46.0 ttl pk-yrs)    Types: Cigarettes    Passive exposure: Never   Smokeless tobacco: Former    Types: Chew   Tobacco comments:    Smokes 1 pack a day of cigarettes   Vaping Use   Vaping status: Former  Substance and Sexual Activity   Alcohol use: Yes    Comment: rare beer use   Drug use: No    Comment: history of drug use- marijuana, cocaine- quit 2014   Sexual activity: Yes    Partners: Female    Birth control/protection: None  Other Topics Concern   Not on file  Social History Narrative   Lives with girlfriend [name]   3 children, but 1 OD  In 2019.   Grandchildren-2      Two cats: may and princess; and a dog-foxy      Enjoy: spending time  with pets, TV, yard work       Diet: eats all food groups   Caffeine: half and half coffee, some tea-half and half decaf   Water: 6-8 cups daily      Wears seat belt   Does not use phone while driving   Smoke detectors at home      Social Determinants of Health   Financial Resource Strain: Low Risk  (11/14/2022)   Overall Financial Resource Strain (CARDIA)    Difficulty of Paying Living Expenses: Not hard at all  Food Insecurity: No Food Insecurity (11/14/2022)   Hunger Vital Sign    Worried  About Running Out of Food in the Last Year: Never true    Ran Out of Food in the Last Year: Never true  Transportation Needs: No Transportation Needs (11/14/2022)   PRAPARE - Administrator, Civil Service (Medical): No    Lack of Transportation (Non-Medical): No  Physical Activity: Inactive (11/14/2022)   Exercise Vital Sign    Days of Exercise per Week: 0 days    Minutes of Exercise per Session: 0 min  Stress: No Stress Concern Present (10/28/2021)   [employer] of Occupational Health - Occupational Stress Questionnaire    Feeling of Stress : Not at all  Social Connections: Moderately Integrated (11/14/2022)   Social Connection and Isolation Panel [NHANES]    Frequency of Communication with Friends and Family: More than three times a week    Frequency of Social Gatherings with Friends and Family: Once a week    Attends Religious Services: More than 4 times per year    Active Member of [employer] or Organizations: Yes    Attends Engineer, structural: More than 4 times per year    Marital Status: Divorced  Intimate Partner Violence: Not At Risk (11/14/2022)   Humiliation, Afraid, Rape, and Kick questionnaire    Fear of Current or Ex-Partner: No    Emotionally Abused: No    Physically Abused: No    Sexually Abused: No     Review of Systems   Gen: Denies any fever, chills, fatigue, weight loss, lack of appetite.  CV: Denies chest pain, heart palpitations, peripheral edema, syncope.  Resp: Denies shortness of breath at rest or with exertion. Denies wheezing or cough.  GI: Denies dysphagia or odynophagia. Denies jaundice, hematemesis, fecal incontinence. GU : Denies urinary burning, urinary frequency, urinary hesitancy MS: Denies joint pain, muscle weakness, cramps, or limitation of movement.  Derm: Denies rash, itching, dry skin Psych: Denies depression, anxiety, memory loss, and confusion Heme: Denies bruising, bleeding, and enlarged lymph nodes.   Physical  Exam   BP (!) 149/83   Pulse 83   Temp 98.4 F (36.9 C)   Ht 5\' 5"  (1.651 m)   Wt 195 lb 9.6 oz (88.7 kg)   BMI 32.55 kg/m  General:   Alert and oriented. Pleasant and cooperative. Well-nourished and well-developed.  Head:  Normocephalic and atraumatic. Eyes:  Without icterus Abdomen:  +BS, large AP diameter, umbilical hernia, diastasis recti, hepatomegaly Rectal:  Deferred  Msk:  Symmetrical without gross deformities. Normal posture. Extremities:  Without edema. Neurologic:  Alert and  oriented x4;  grossly normal neurologically. Skin:  Intact without significant lesions or rashes. Psych:  Alert and cooperative. Normal mood and affect.   Assessment   SAICHARAN SPARACINO is a 57 y.o. male with a history of Barrett's esophagus,  cirrhosis due to alcohol, tubular adenomas, presenting for routine follow-up.  Cirrhosis:  he continues to drink despite multiple discussions of morbidity/mortality with alcohol intake. EGD last in Jan 2023 with three columns of Grade 1 varices. No biopsies for Barrett's at that time and last biopsy was in 2021. Due for variceal surveillance now. [place] up-to-date and will need again in 6 months. Routine labs today.   GERD/Barrett's: last biopsies in 2021. PPI BID controlling symptoms.  Gallbladder polyp: noted stable on recent US. This can be followed serially as he is due for 6 month [place] with cirrhosis regardless. Would be a poor candidate for elective cholecystectomy in future due to cirrhosis.  Adenomas: surveillance due in 2028.  PLAN    Proceed with upper endoscopy by Dr. Jena Gauss in near future: the risks, benefits, and alternatives have been discussed with the patient in detail. The patient states understanding and desires to proceed. ASA 3 PPI BID MELD Labs and AFP today [place] in Feb 2025 Recommend Hep A and B vaccinations as not immune Absolutely abstain from alcohol. Discussed again at visit. Return in 6 months   Gelene Mink, PhD, [facility]  Gastroenterology

## 2023-05-01 ENCOUNTER — Other Ambulatory Visit: Payer: Self-pay | Admitting: Family Medicine

## 2023-05-06 ENCOUNTER — Ambulatory Visit (INDEPENDENT_AMBULATORY_CARE_PROVIDER_SITE_OTHER): Payer: 59 | Admitting: Family Medicine

## 2023-05-06 ENCOUNTER — Encounter: Payer: Self-pay | Admitting: Family Medicine

## 2023-05-06 VITALS — BP 110/63 | Temp 98.6°F | Ht 65.0 in | Wt 197.2 lb

## 2023-05-06 DIAGNOSIS — H538 Other visual disturbances: Secondary | ICD-10-CM | POA: Diagnosis not present

## 2023-05-06 DIAGNOSIS — R519 Headache, unspecified: Secondary | ICD-10-CM

## 2023-05-06 DIAGNOSIS — R2990 Unspecified symptoms and signs involving the nervous system: Secondary | ICD-10-CM | POA: Diagnosis not present

## 2023-05-06 LAB — COMPREHENSIVE METABOLIC PANEL
ALT: 52 IU/L — ABNORMAL HIGH (ref 0–44)
AST: 46 IU/L — ABNORMAL HIGH (ref 0–40)
Albumin: 4.3 g/dL (ref 3.8–4.9)
Alkaline Phosphatase: 97 IU/L (ref 44–121)
BUN/Creatinine Ratio: 10 (ref 9–20)
BUN: 7 mg/dL (ref 6–24)
Bilirubin Total: 1.2 mg/dL (ref 0.0–1.2)
CO2: 22 mmol/L (ref 20–29)
Calcium: 9.5 mg/dL (ref 8.7–10.2)
Chloride: 100 mmol/L (ref 96–106)
Creatinine, Ser: 0.7 mg/dL — ABNORMAL LOW (ref 0.76–1.27)
Globulin, Total: 2.6 g/dL (ref 1.5–4.5)
Glucose: 122 mg/dL — ABNORMAL HIGH (ref 70–99)
Potassium: 3.8 mmol/L (ref 3.5–5.2)
Sodium: 138 mmol/L (ref 134–144)
Total Protein: 6.9 g/dL (ref 6.0–8.5)
eGFR: 107 mL/min/{1.73_m2} (ref >59)

## 2023-05-06 LAB — CBC WITH DIFFERENTIAL/PLATELET
Basophils Absolute: 0 10*3/uL (ref 0.0–0.2)
Basos: 0 %
EOS (ABSOLUTE): 0.1 10*3/uL (ref 0.0–0.4)
Eos: 2 %
Hematocrit: 46.2 % (ref 37.5–51.0)
Hemoglobin: 15.9 g/dL (ref 13.0–17.7)
Immature Grans (Abs): 0 10*3/uL (ref 0.0–0.1)
Immature Granulocytes: 0 %
Lymphocytes Absolute: 1.4 10*3/uL (ref 0.7–3.1)
Lymphs: 18 %
MCH: 31.5 pg (ref 26.6–33.0)
MCHC: 34.4 g/dL (ref 31.5–35.7)
MCV: 92 fL (ref 79–97)
Monocytes Absolute: 0.6 10*3/uL (ref 0.1–0.9)
Monocytes: 8 %
Neutrophils Absolute: 5.5 10*3/uL (ref 1.4–7.0)
Neutrophils: 72 %
Platelets: 148 10*3/uL — ABNORMAL LOW (ref 150–450)
RBC: 5.04 x10E6/uL (ref 4.14–5.80)
RDW: 13.1 % (ref 11.6–15.4)
WBC: 7.6 10*3/uL (ref 3.4–10.8)

## 2023-05-06 LAB — PROTIME-INR
INR: 1.1 (ref 0.9–1.2)
Prothrombin Time: 11.9 s (ref 9.1–12.0)

## 2023-05-06 LAB — AFP TUMOR MARKER: AFP, Serum, Tumor Marker: 3.5 ng/mL (ref 0.0–8.4)

## 2023-05-06 NOTE — Assessment & Plan Note (Signed)
Patient has history and physical exam very concerning.  Concern for intracranial abnormality.  Etiology and prognosis unclear at this time.  Needs brain imaging.  Order placed for MRI.  Patient has a loop recorder.  Per radiology, they have a process for dealing with these types of situations.  They will reach out to the patient as well as manufacturer of his device.  Arranging MRI as quickly as possible.

## 2023-05-06 NOTE — Patient Instructions (Signed)
You need imaging.   I am going to work on this.  We will call and get this set up.  Take care  Dr. Adriana Simas

## 2023-05-06 NOTE — Telephone Encounter (Signed)
Left a detailed message for patient advising that paperwork was available for pick up at the [place] office.

## 2023-05-06 NOTE — Telephone Encounter (Signed)
Patient is returning call.  °

## 2023-05-06 NOTE — Progress Notes (Signed)
Subjective:  Patient ID: Randy Hebert, male    DOB: 1966/07/17  Age: 57 y.o. MRN: [id number]  CC: Blurred vision  HPI:  56 year old male with an extensive and complicated medical history as seen below presents for evaluation of blurred vision.  Patient reports that he has not been feeling well since Friday.  He states that he has had headache particularly in the right temporal region.  He is also had blurred vision.  He said difficulty concentrating and focusing.  States that seems to have difficulty with certain activities like reaching for door handle.  States that it is difficult for him to grab it and he often does not grab it on the first try.  He states that he has not had any fever.  No respiratory symptoms.  Good oxygen saturation.  Blood sugars stable.  Blood pressure well-controlled.  He is very concerned about his symptoms.  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   Vocal cord dysfunction 05/21/2022   DDD (degenerative disc disease), cervical 05/21/2022   Osteoarthritis of both hands 05/19/2022   History of GI bleed 11/13/2021   Insomnia 09/13/2021   Cirrhosis of liver (HCC) 11/08/2018   Barrett's esophagus 08/04/2018   Vitamin D deficiency 06/30/2018   Alcohol abuse 06/30/2018   DM type 2 causing vascular disease (HCC) 08/17/2017   Chronic combined systolic and diastolic CHF (congestive heart failure) (HCC) 05/15/2017   Syncope and collapse 05/15/2017   OSA (obstructive sleep apnea) 04/28/2017   Depression with anxiety 04/15/2017   GERD (gastroesophageal  reflux disease) 09/23/2016   Asthmatic bronchitis , chronic 08/27/2015   Mixed hyperlipidemia 08/27/2015   Class 1 obesity due to excess calories with serious comorbidity and body mass index (BMI) of 32.0 to 32.9 in adult 08/27/2015   CAD (coronary artery disease) 05/31/2015   Current smoker 05/31/2015   Essential hypertension, benign 02/09/2015    Social Hx   Social History   Socioeconomic History   Marital status: Divorced    Spouse name: alice   Number of children: 3   Years of education: 12   Highest education level: Not on file  Occupational History   Occupation: SSI  Tobacco Use   Smoking status: Every Day    Current packs/day: 1.00    Average packs/day: 1 pack/day for 46.0 years (46.0 ttl pk-yrs)    Types: Cigarettes    Passive exposure: Never   Smokeless tobacco: Former    Types: Chew   Tobacco comments:    Smokes 1 pack a day of cigarettes   Vaping Use   Vaping status: Former  Substance and Sexual Activity   Alcohol use: Yes    Comment: rare beer use   Drug use: No    Comment: history of drug use- marijuana, cocaine- quit 2014   Sexual activity: Yes    Partners: Female    Birth control/protection: None  Other Topics Concern   Not on file  Social History Narrative   Lives with girlfriend [name]   3 children, but 1 OD  In 2019.   Grandchildren-2  Two cats: may and princess; and a dog-foxy      Enjoy: spending time with pets, TV, yard work       Diet: eats all food groups   Caffeine: half and half coffee, some tea-half and half decaf   Water: 6-8 cups daily      Wears seat belt   Does not use phone while driving   Smoke detectors at home      Social Determinants of Health   Financial Resource Strain: Low Risk  (11/14/2022)   Overall Financial Resource Strain (CARDIA)    Difficulty of Paying Living Expenses: Not hard at all  Food Insecurity: No Food Insecurity (11/14/2022)   Hunger Vital Sign    Worried About Running Out of Food in the Last  Year: Never true    Ran Out of Food in the Last Year: Never true  Transportation Needs: No Transportation Needs (11/14/2022)   PRAPARE - Administrator, Civil Service (Medical): No    Lack of Transportation (Non-Medical): No  Physical Activity: Inactive (11/14/2022)   Exercise Vital Sign    Days of Exercise per Week: 0 days    Minutes of Exercise per Session: 0 min  Stress: No Stress Concern Present (10/28/2021)   [employer] of Occupational Health - Occupational Stress Questionnaire    Feeling of Stress : Not at all  Social Connections: Moderately Integrated (11/14/2022)   Social Connection and Isolation Panel [NHANES]    Frequency of Communication with Friends and Family: More than three times a week    Frequency of Social Gatherings with Friends and Family: Once a week    Attends Religious Services: More than 4 times per year    Active Member of [employer] or Organizations: Yes    Attends Engineer, structural: More than 4 times per year    Marital Status: Divorced    Review of Systems Per HPI  Objective:  BP 110/63   Temp 98.6 F (37 C) (Temporal)   Ht 5\' 5"  (1.651 m)   Wt 197 lb 3.2 oz (89.4 kg)   SpO2 95%   BMI 32.82 kg/m      05/06/2023    4:03 PM 04/30/2023    9:14 AM 04/30/2023    9:11 AM  BP/Weight  Systolic BP 110 149 149  Diastolic BP 63 83 86  Wt. (Lbs) 197.2  195.6  BMI 32.82 kg/m2  32.55 kg/m2    Physical Exam Constitutional:      General: He is not in acute distress. HENT:     Head: Normocephalic and atraumatic.  Cardiovascular:     Rate and Rhythm: Normal rate and regular rhythm.  Pulmonary:     Effort: Pulmonary effort is normal.     Breath sounds: No wheezing.  Neurological:     Mental Status: He is alert.     Comments: EOMI. Finger to nose and nose to finger - abnormal (dysmetria).   Psychiatric:        Mood and Affect: Mood normal.     Lab Results  Component Value Date   WBC 7.6 05/05/2023   HGB 15.9 05/05/2023    HCT 46.2 05/05/2023   PLT 148 (L) 05/05/2023   GLUCOSE 122 (H) 05/05/2023   CHOL 197 12/25/2022   TRIG 319 (H) 12/25/2022   HDL 40 12/25/2022   LDLCALC 103 (H) 12/25/2022   ALT 52 (H) 05/05/2023   AST 46 (H) 05/05/2023   NA 138 05/05/2023   K  3.8 05/05/2023   CL 100 05/05/2023   CREATININE 0.70 (L) 05/05/2023   BUN 7 05/05/2023   CO2 22 05/05/2023   TSH 3.61 06/21/2020   PSA 0.23 06/21/2020   INR 1.1 05/05/2023   HGBA1C 6.7 (H) 12/25/2022   MICROALBUR 1.8 11/09/2019     Assessment & Plan:   Problem List Items Addressed This Visit       Other   Abnormal neurological exam - Primary    Patient has history and physical exam very concerning.  Concern for intracranial abnormality.  Etiology and prognosis unclear at this time.  Needs brain imaging.  Order placed for MRI.  Patient has a loop recorder.  Per radiology, they have a process for dealing with these types of situations.  They will reach out to the patient as well as manufacturer of his device.  Arranging MRI as quickly as possible.      Relevant Orders   MR Brain Wo Contrast   Other Visit Diagnoses     New onset headache       Relevant Orders   MR Brain Wo Contrast   Blurred vision       Relevant Orders   MR Brain Wo Contrast       Follow-up:  Pending imaging  Sumayah Bearse DO [facility]

## 2023-05-07 ENCOUNTER — Ambulatory Visit (HOSPITAL_BASED_OUTPATIENT_CLINIC_OR_DEPARTMENT_OTHER)
Admission: RE | Admit: 2023-05-07 | Discharge: 2023-05-07 | Disposition: A | Discharge status: Home / Self Care | Payer: 59 | Source: Ambulatory Visit | Attending: Family Medicine | Admitting: Family Medicine

## 2023-05-07 DIAGNOSIS — R2990 Unspecified symptoms and signs involving the nervous system: Secondary | ICD-10-CM | POA: Insufficient documentation

## 2023-05-07 DIAGNOSIS — H538 Other visual disturbances: Secondary | ICD-10-CM | POA: Diagnosis not present

## 2023-05-07 DIAGNOSIS — Z85118 Personal history of other malignant neoplasm of bronchus and lung: Secondary | ICD-10-CM | POA: Diagnosis not present

## 2023-05-07 DIAGNOSIS — R519 Headache, unspecified: Secondary | ICD-10-CM | POA: Diagnosis not present

## 2023-05-07 DIAGNOSIS — I6782 Cerebral ischemia: Secondary | ICD-10-CM | POA: Diagnosis not present

## 2023-05-07 DIAGNOSIS — Z8673 Personal history of transient ischemic attack (TIA), and cerebral infarction without residual deficits: Secondary | ICD-10-CM | POA: Diagnosis not present

## 2023-05-18 ENCOUNTER — Telehealth: Payer: Self-pay | Admitting: *Deleted

## 2023-05-18 ENCOUNTER — Telehealth: Payer: Self-pay | Admitting: Cardiology

## 2023-05-18 MED ORDER — NITROGLYCERIN 0.4 MG SL SUBL: 0.4000 mg | SUBLINGUAL_TABLET | SUBLINGUAL | 3 refills | Status: DC | PRN

## 2023-05-18 NOTE — Telephone Encounter (Signed)
CALLED PT. He has been scheduled for EGD with Dr. Jena Gauss 10/14. Discussed meds to hold and when. Advised will call back with pre-op appt.

## 2023-05-18 NOTE — Telephone Encounter (Signed)
*  STAT* If patient is at the pharmacy, call can be transferred to refill team.   1. Which medications need to be refilled? (please list name of each medication and dose if known) nitroGLYCERIN (NITROSTAT) 0.4 MG SL tablet   2. Which pharmacy/location (including street and city if local pharmacy) is medication to be sent to?  [facility] - [place], [place] - [address]    3. Do they need a 30 day or 90 day supply? 90

## 2023-05-18 NOTE — Telephone Encounter (Signed)
Refill request completed.

## 2023-05-19 NOTE — Telephone Encounter (Signed)
Called pt, left detailed VM on named VM with pre-op appt details. Letter also mailed

## 2023-06-04 ENCOUNTER — Other Ambulatory Visit: Payer: Self-pay

## 2023-06-04 DIAGNOSIS — E1159 Type 2 diabetes mellitus with other circulatory complications: Secondary | ICD-10-CM

## 2023-06-04 MED ORDER — TRULICITY 1.5 MG/0.5ML ~~LOC~~ SOAJ
1.5000 mg | SUBCUTANEOUS | 0 refills | Status: DC
Start: 2023-06-04 — End: 2023-07-22

## 2023-06-09 ENCOUNTER — Other Ambulatory Visit: Payer: Self-pay | Admitting: Family Medicine

## 2023-06-09 MED ORDER — SILDENAFIL CITRATE 25 MG PO TABS: ORAL_TABLET | ORAL | 0 refills | Status: DC

## 2023-06-09 NOTE — Telephone Encounter (Signed)
Refill on sildenafil (VIAGRA) 25 MG tablet  send to [facility]

## 2023-06-10 ENCOUNTER — Encounter: Payer: Self-pay | Admitting: Neurology

## 2023-06-10 ENCOUNTER — Ambulatory Visit (INDEPENDENT_AMBULATORY_CARE_PROVIDER_SITE_OTHER): Payer: 59 | Admitting: Neurology

## 2023-06-10 VITALS — BP 139/79 | HR 89 | Ht 65.0 in | Wt 197.4 lb

## 2023-06-10 DIAGNOSIS — R55 Syncope and collapse: Secondary | ICD-10-CM | POA: Diagnosis not present

## 2023-06-10 NOTE — Patient Instructions (Signed)
Good to meet you. Continue follow-up with your Cardiology and Pulmonologist. Follow-up as needed, call for any changes.
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
## 2023-06-11 ENCOUNTER — Encounter: Payer: Self-pay | Admitting: *Deleted

## 2023-06-12 ENCOUNTER — Encounter (HOSPITAL_COMMUNITY)
Admission: RE | Admit: 2023-06-12 | Discharge: 2023-06-12 | Disposition: A | Discharge status: Home / Self Care | Payer: 59 | Source: Ambulatory Visit | Attending: Internal Medicine | Admitting: Internal Medicine

## 2023-06-15 ENCOUNTER — Encounter (HOSPITAL_COMMUNITY): Payer: Self-pay | Admitting: Internal Medicine

## 2023-06-15 ENCOUNTER — Ambulatory Visit (HOSPITAL_COMMUNITY)
Admission: RE | Admit: 2023-06-15 | Discharge: 2023-06-15 | Disposition: A | Discharge status: Home / Self Care | Payer: 59 | Attending: Internal Medicine | Admitting: Internal Medicine

## 2023-06-15 ENCOUNTER — Ambulatory Visit (HOSPITAL_BASED_OUTPATIENT_CLINIC_OR_DEPARTMENT_OTHER): Payer: 59 | Admitting: Certified Registered"

## 2023-06-15 ENCOUNTER — Ambulatory Visit (HOSPITAL_COMMUNITY): Payer: 59 | Admitting: Certified Registered"

## 2023-06-15 ENCOUNTER — Encounter (HOSPITAL_COMMUNITY)
Admission: RE | Disposition: A | Discharge status: Home / Self Care | Payer: Self-pay | Source: Home / Self Care | Attending: Internal Medicine

## 2023-06-15 DIAGNOSIS — I8510 Secondary esophageal varices without bleeding: Secondary | ICD-10-CM | POA: Diagnosis not present

## 2023-06-15 DIAGNOSIS — K2270 Barrett's esophagus without dysplasia: Secondary | ICD-10-CM

## 2023-06-15 DIAGNOSIS — I252 Old myocardial infarction: Secondary | ICD-10-CM | POA: Insufficient documentation

## 2023-06-15 DIAGNOSIS — R0602 Shortness of breath: Secondary | ICD-10-CM | POA: Diagnosis not present

## 2023-06-15 DIAGNOSIS — I8500 Esophageal varices without bleeding: Secondary | ICD-10-CM

## 2023-06-15 DIAGNOSIS — Z860101 Personal history of adenomatous and serrated colon polyps: Secondary | ICD-10-CM | POA: Diagnosis not present

## 2023-06-15 DIAGNOSIS — F32A Depression, unspecified: Secondary | ICD-10-CM | POA: Diagnosis not present

## 2023-06-15 DIAGNOSIS — E119 Type 2 diabetes mellitus without complications: Secondary | ICD-10-CM | POA: Insufficient documentation

## 2023-06-15 DIAGNOSIS — F17210 Nicotine dependence, cigarettes, uncomplicated: Secondary | ICD-10-CM | POA: Insufficient documentation

## 2023-06-15 DIAGNOSIS — I110 Hypertensive heart disease with heart failure: Secondary | ICD-10-CM | POA: Insufficient documentation

## 2023-06-15 DIAGNOSIS — K3189 Other diseases of stomach and duodenum: Secondary | ICD-10-CM | POA: Diagnosis not present

## 2023-06-15 DIAGNOSIS — G4730 Sleep apnea, unspecified: Secondary | ICD-10-CM | POA: Diagnosis not present

## 2023-06-15 DIAGNOSIS — F419 Anxiety disorder, unspecified: Secondary | ICD-10-CM | POA: Diagnosis not present

## 2023-06-15 DIAGNOSIS — J449 Chronic obstructive pulmonary disease, unspecified: Secondary | ICD-10-CM | POA: Diagnosis not present

## 2023-06-15 DIAGNOSIS — I2510 Atherosclerotic heart disease of native coronary artery without angina pectoris: Secondary | ICD-10-CM | POA: Diagnosis not present

## 2023-06-15 DIAGNOSIS — R519 Headache, unspecified: Secondary | ICD-10-CM | POA: Insufficient documentation

## 2023-06-15 DIAGNOSIS — K766 Portal hypertension: Secondary | ICD-10-CM | POA: Insufficient documentation

## 2023-06-15 DIAGNOSIS — Z955 Presence of coronary angioplasty implant and graft: Secondary | ICD-10-CM | POA: Diagnosis not present

## 2023-06-15 DIAGNOSIS — I509 Heart failure, unspecified: Secondary | ICD-10-CM | POA: Diagnosis not present

## 2023-06-15 DIAGNOSIS — F17200 Nicotine dependence, unspecified, uncomplicated: Secondary | ICD-10-CM | POA: Diagnosis not present

## 2023-06-15 DIAGNOSIS — F413 Other mixed anxiety disorders: Secondary | ICD-10-CM

## 2023-06-15 DIAGNOSIS — D649 Anemia, unspecified: Secondary | ICD-10-CM | POA: Diagnosis not present

## 2023-06-15 DIAGNOSIS — K219 Gastro-esophageal reflux disease without esophagitis: Secondary | ICD-10-CM | POA: Diagnosis not present

## 2023-06-15 DIAGNOSIS — F1090 Alcohol use, unspecified, uncomplicated: Secondary | ICD-10-CM | POA: Insufficient documentation

## 2023-06-15 HISTORY — PX: ESOPHAGOGASTRODUODENOSCOPY (EGD) WITH PROPOFOL: SHX5813

## 2023-06-15 HISTORY — PX: BIOPSY: SHX5522

## 2023-06-15 LAB — GLUCOSE, CAPILLARY: Glucose-Capillary: 171 mg/dL — ABNORMAL HIGH (ref 70–99)

## 2023-06-15 SURGERY — ESOPHAGOGASTRODUODENOSCOPY (EGD) WITH PROPOFOL
Anesthesia: General

## 2023-06-15 MED ORDER — DEXMEDETOMIDINE HCL IN NACL 80 MCG/20ML IV SOLN
INTRAVENOUS | Status: DC | PRN
Start: 2023-06-15 — End: 2023-06-15
  Administered 2023-06-15: 8 ug via INTRAVENOUS

## 2023-06-15 MED ORDER — STERILE WATER FOR IRRIGATION IR SOLN
Status: DC | PRN
  Administered 2023-06-15: 60 mL

## 2023-06-15 MED ORDER — LIDOCAINE HCL (CARDIAC) PF 100 MG/5ML IV SOSY
PREFILLED_SYRINGE | INTRAVENOUS | Status: DC | PRN
  Administered 2023-06-15: 50 mg via INTRAVENOUS

## 2023-06-15 MED ORDER — PROPOFOL 10 MG/ML IV BOLUS
INTRAVENOUS | Status: DC | PRN
  Administered 2023-06-15: 100 mg via INTRAVENOUS

## 2023-06-15 MED ORDER — DEXMEDETOMIDINE HCL IN NACL 80 MCG/20ML IV SOLN
INTRAVENOUS | Status: AC
  Filled 2023-06-15: qty 20

## 2023-06-15 MED ORDER — LACTATED RINGERS IV SOLN
INTRAVENOUS | Status: DC | PRN
Start: 2023-06-15 — End: 2023-06-15

## 2023-06-15 MED ORDER — PROPOFOL 500 MG/50ML IV EMUL
INTRAVENOUS | Status: DC | PRN
  Administered 2023-06-15: 150 ug/kg/min via INTRAVENOUS

## 2023-06-15 NOTE — Anesthesia Preprocedure Evaluation (Signed)
Anesthesia Evaluation  Patient identified by MRN, date of birth, ID band Patient awake    Reviewed: Allergy & Precautions, H&P , NPO status , Patient's Chart, lab work & pertinent test results, reviewed documented beta blocker date and time   History of Anesthesia Complications (+) DIFFICULT AIRWAY and history of anesthetic complications  Airway Mallampati: II  TM Distance: >3 FB Neck ROM: full    Dental no notable dental hx.    Pulmonary neg pulmonary ROS, shortness of breath, asthma , sleep apnea , pneumonia, COPD, Current Smoker   Pulmonary exam normal breath sounds clear to auscultation       Cardiovascular Exercise Tolerance: Good hypertension, + CAD, + Past MI and +CHF  negative cardio ROS + Valvular Problems/Murmurs  Rhythm:regular Rate:Normal     Neuro/Psych  Headaches PSYCHIATRIC DISORDERS Anxiety Depression    negative neurological ROS  negative psych ROS   GI/Hepatic negative GI ROS, Neg liver ROS,GERD  ,,  Endo/Other  negative endocrine ROSdiabetes    Renal/GU negative Renal ROS  negative genitourinary   Musculoskeletal   Abdominal   Peds  Hematology negative hematology ROS (+) Blood dyscrasia, anemia   Anesthesia Other Findings   Reproductive/Obstetrics negative OB ROS                             Anesthesia Physical Anesthesia Plan  ASA: 3  Anesthesia Plan: General   Post-op Pain Management:    Induction:   PONV Risk Score and Plan: Propofol infusion  Airway Management Planned:   Additional Equipment:   Intra-op Plan:   Post-operative Plan:   Informed Consent: I have reviewed the patients History and Physical, chart, labs and discussed the procedure including the risks, benefits and alternatives for the proposed anesthesia with the patient or authorized representative who has indicated his/her understanding and acceptance.     Dental Advisory Given  Plan  Discussed with: CRNA  Anesthesia Plan Comments:        Anesthesia Quick Evaluation

## 2023-06-15 NOTE — Transfer of Care (Signed)
Immediate Anesthesia Transfer of Care Note  Patient: Randy Hebert  Procedure(s) Performed: ESOPHAGOGASTRODUODENOSCOPY (EGD) WITH PROPOFOL BIOPSY  Patient Location: Short Stay  Anesthesia Type:General  Level of Consciousness: awake and oriented  Airway & Oxygen Therapy: Patient Spontanous Breathing  Post-op Assessment: Report given to RN and Post -op Vital signs reviewed and stable  Post vital signs: Reviewed and stable  Last Vitals:  Vitals Value Taken Time  BP    Temp    Pulse    Resp    SpO2      Last Pain:  Vitals:   06/15/23 0732  TempSrc:   PainSc: 0-No pain      Patients Stated Pain Goal: 5 (06/15/23 0708)  Complications: No notable events documented.

## 2023-06-15 NOTE — H&P (Signed)
@LOGO @   Primary Care Physician:  Tommie Sams, DO Primary Gastroenterologist:  Dr. Jena Gauss  Pre-Procedure History & Physical: HPI:  Randy Hebert is a 57 y.o. male here for surveillance EGD.  History of very small short segment Barrett's esophagus and grade 1 esophageal varices.  Last EGD over a year and a half ago.  He continues to consume alcohol.  Past Medical History:  Diagnosis Date   ACE inhibitor-aggravated angioedema    Allergy    Anemia    Angio-edema    Anxiety    Arthritis    Asthma    hip replacement   Back pain    Bradycardia 04/28/2017   Bulging of cervical intervertebral disc    CAD (coronary artery disease)    lateral STEMI 02/06/2015 00% D1 occlusion treated with Promus Premier 2.5 mm x 16 mm DES, 70% ramus stenosis, 40% mid RCA stenosis, 45% distal RCA stenosis, EF 45-50%   CHF (congestive heart failure) (HCC)    COPD (chronic obstructive pulmonary disease) (HCC)    Depression    Diabetes mellitus without complication (HCC)    Difficult intubation    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   Dry eye    Dyspnea    Early satiety 09/23/2016   Fatty liver    GERD (gastroesophageal reflux disease)    HCAP (healthcare-associated pneumonia) 05/15/2017   Headache    Heart murmur    Hip pain    Hyperlipidemia    Hypertension    Lobar pneumonia (HCC) 05/15/2017   Melena 08/04/2018   MI (myocardial infarction) [facility])    Myocardial infarction (HCC)    Neck pain    Non-ST elevation (NSTEMI) myocardial infarction (HCC) 04/27/2017   NSTEMI (non-ST elevated myocardial infarction) (HCC) 04/26/2017   Otitis media    Pleurisy    Pneumonia due to COVID-19 virus 10/07/2019   Rectal bleeding 11/08/2018   Right shoulder pain 03/20/2020   Sinus pause    9 sec sinus pause on telemetry after started on coreg after MI, avoid AV nodal blocking agent   Sleep apnea    uses a cpap   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   STEMI (ST elevation  myocardial infarction) (HCC) 05/31/2015   Substance abuse (HCC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   Syncope 06/29/2017   Syncope and collapse 05/15/2017   Transaminitis 08/04/2018   Unilateral primary osteoarthritis, right hip 07/01/2016    Past Surgical History:  Procedure Laterality Date   BIOPSY  10/09/2016   Procedure: BIOPSY;  Surgeon: Corbin Ade, MD;  Location: [facility] ENDO [facility];  Service: Endoscopy;;   BIOPSY  11/28/2019   Procedure: BIOPSY;  Surgeon: Corbin Ade, MD;  Location: [facility];  Service: Endoscopy;;   BRONCHIAL BIOPSY  10/16/2022   Procedure: BRONCHIAL BIOPSIES;  Surgeon: Omar Person, MD;  Location: [facility] ENDOSCOPY;  Service: Pulmonary;;   BRONCHIAL NEEDLE ASPIRATION BIOPSY  10/16/2022   Procedure: BRONCHIAL NEEDLE ASPIRATION BIOPSIES;  Surgeon: Omar Person, MD;  Location: [facility] ENDOSCOPY;  Service: Pulmonary;;   BRONCHIAL WASHINGS  10/16/2022   Procedure: BRONCHIAL WASHINGS;  Surgeon: Omar Person, MD;  Location: [facility] ENDOSCOPY;  Service: Pulmonary;;   CARDIAC CATHETERIZATION N/A 02/06/2015   Procedure: Left Heart Cath and Coronary Angiography;  Surgeon: Marykay Lex, MD;  Location: [facility] INVASIVE CV LAB;  Service: Cardiovascular;  Laterality: N/A;   CARDIAC CATHETERIZATION N/A 02/06/2015   Procedure: Coronary Stent Intervention;  Surgeon: Marykay Lex,  MD;  Location: [facility] INVASIVE CV LAB;  Service: Cardiovascular;  Laterality: N/A;   COLONOSCOPY WITH PROPOFOL N/A 10/09/2016   Sigmoid and descending colon diverticulosis, four 4-6 mm polyps in sigmoid, one 4 mm polyp in descending. Tubular adenomas and hyperplastic. 5 year surveillance.    COLONOSCOPY WITH PROPOFOL N/A 11/24/2016   Sigmoid and descending colon diverticulosis, four 4-6 mm polyps in sigmoid, one 4 mm polyp in descending. Tubular adenomas and hyperplastic. 5 year surveillance.    COLONOSCOPY WITH PROPOFOL N/A 10/18/2021   Carver: nonbleeding internal hemorrhoids, small and large mouth diverticula found in  the sigmoid, descending, transverse colon.  A 9 mm sessile polyp was found in the ascending colon that was removed.  4 sessile polyps found in the sigmoid, descending, transverse colon 3 to 5 mm in size, examining otherwise.  Path revealed tubular adenomas.  Repeat due in 5 years for surveillance.   CORONARY ANGIOPLASTY WITH STENT PLACEMENT  01/2015   CORONARY STENT INTERVENTION N/A 04/27/2017   Procedure: CORONARY STENT INTERVENTION;  Surgeon: Yvonne Kendall, MD;  Location: [facility] INVASIVE CV LAB;  Service: Cardiovascular;  Laterality: N/A;   ELECTROPHYSIOLOGY STUDY N/A 06/29/2017   Procedure: ELECTROPHYSIOLOGY STUDY;  Surgeon: Marinus Maw, MD;  Location: [facility] INVASIVE CV LAB;  Service: Cardiovascular;  Laterality: N/A;   ESOPHAGOGASTRODUODENOSCOPY (EGD) WITH PROPOFOL N/A 10/09/2016   Dr. Jena Gauss: LA grade a esophagitis.  Barrett's esophagus, biopsy-proven.  Small hiatal hernia.  EGD February 2019   ESOPHAGOGASTRODUODENOSCOPY (EGD) WITH PROPOFOL N/A 11/28/2019    salmon-colored esophageal mucosa (Barrett's) small hiatal hernia, portal hypertensive gastropathy, normal duodenum, 3 year surveillance   ESOPHAGOGASTRODUODENOSCOPY (EGD) WITH PROPOFOL N/A 09/06/2021   three columns of grade 1 varices in distal esophagus, no stigmata of bleeding or red wale signs. Small hiatal hernia. Mild portal gastropathy. Normal duodenum. Repeat upper endoscopy in 1 year for surveillance.   FIDUCIAL MARKER PLACEMENT  10/16/2022   Procedure: FIDUCIAL MARKER PLACEMENT;  Surgeon: Omar Person, MD;  Location: [facility] ENDOSCOPY;  Service: Pulmonary;;   INCISION / DRAINAGE HAND / FINGER     LEFT HEART CATH AND CORONARY ANGIOGRAPHY N/A 04/27/2017   Procedure: LEFT HEART CATH AND CORONARY ANGIOGRAPHY;  Surgeon: Yvonne Kendall, MD;  Location: [facility] INVASIVE CV LAB;  Service: Cardiovascular;  Laterality: N/A;   LOOP RECORDER INSERTION  06/29/2017   Procedure: Loop Recorder Insertion;  Surgeon: Marinus Maw, MD;  Location: [facility]  INVASIVE CV LAB;  Service: Cardiovascular;;   POLYPECTOMY  11/24/2016   Procedure: POLYPECTOMY;  Surgeon: Corbin Ade, MD;  Location: [facility];  Service: Endoscopy;;  descending and sigmoid   POLYPECTOMY  10/18/2021   Procedure: POLYPECTOMY;  Surgeon: Lanelle Bal, DO;  Location: [facility];  Service: Endoscopy;;   TOTAL HIP ARTHROPLASTY Right 07/01/2016   TOTAL HIP ARTHROPLASTY Right 07/01/2016   Procedure: RIGHT TOTAL HIP ARTHROPLASTY ANTERIOR APPROACH;  Surgeon: Kathryne Hitch, MD;  Location: [facility] OR;  Service: Orthopedics;  Laterality: Right;    Prior to Admission medications   Medication Sig Start Date End Date Taking? Authorizing Provider  acetaminophen (TYLENOL) 500 MG tablet Take 2 tablets (1,000 mg total) by mouth every 8 (eight) hours as needed for mild pain or fever. 09/06/21  Yes Lurene Shadow, MD  albuterol (VENTOLIN HFA) 108 (90 Base) MCG/ACT inhaler INHALE 2 PUFFS INTO THE LUNGS EVERY 6 HOURS AS NEEDEDFOR SHORTNESS OF BREATH OR WHEEZING. 08/08/21  Yes Ambs, Norvel Richards, FNP  amLODipine (NORVASC) 10 MG tablet TAKE 1 TABLET BY  MOUTH ONCE A DAY. 04/07/23  Yes Cook, Jayce G, DO  ascorbic acid (VITAMIN C) 500 MG tablet Take 1 tablet (500 mg total) by mouth daily. 10/12/19  Yes Vassie Loll, MD  budesonide-formoterol [facility]) 160-4.5 MCG/ACT inhaler Take 2 puffs first thing in am and then another 2 puffs about 12 hours later. 03/05/22  Yes Nyoka Cowden, MD  buPROPion [facility] SR) 150 MG 12 hr tablet Take 1 tablet (150 mg total) by mouth 2 (two) times daily. 12/25/22  Yes Cook, Verdis Frederickson, DO  Cholecalciferol (VITAMIN D3) 125 MCG (5000 UT) CAPS Take 1 capsule (5,000 Units total) by mouth daily. 07/19/19  Yes Nida, Denman George, MD  citalopram (CELEXA) 20 MG tablet Take 1 tablet (20 mg total) by mouth daily. 03/25/22  Yes Cook, Jayce G, DO  cycloSPORINE (RESTASIS) 0.05 % ophthalmic emulsion Place 1 drop into both eyes 2 (two) times daily.   Yes [provider]   ezetimibe (ZETIA) 10 MG tablet Take 1 tablet (10 mg total) by mouth daily. 01/04/23  Yes Everlene Other G, DO  ferrous sulfate 325 (65 FE) MG tablet Take 1 tablet (325 mg total) by mouth daily with breakfast. 04/02/21  Yes Heather Roberts, NP  furosemide (LASIX) 40 MG tablet Take 1 tablet (40 mg total) by mouth daily as needed for fluid. 07/03/22  Yes Branch, Dorothe Pea, MD  glipiZIDE (GLUCOTROL XL) 5 MG 24 hr tablet TAKE 1 TABLET BY MOUTH DAILY WITH BREAKFAST. 08/08/22  Yes Nida, Denman George, MD  glucose blood (ACCU-CHEK GUIDE) test strip Use to test glucose 2 times daily. 02/20/22  Yes Nida, Denman George, MD  magnesium gluconate (MAGONATE) 500 MG tablet Take 500 mg by mouth 2 (two) times daily.   Yes [provider]  metFORMIN (GLUCOPHAGE) 500 MG tablet TAKE 1 TABLET BY MOUTH TWICE DAILY WITH MEALS. 04/16/22  Yes Nida, Denman George, MD  mometasone (ELOCON) 0.1 % cream Apply 1 application topically daily as needed (eczema on ears). 10/21/18  Yes [provider]  NEOMYCIN-POLYMYXIN-HYDROCORTISONE (CORTISPORIN) 1 % SOLN OTIC solution INSTILL 3 DROPS INTO AFFECTED EAR(S) 3 TIMES DAILY AS NEEDED. 03/09/23  Yes Cook, Jayce G, DO  olopatadine (PATANOL) 0.1 % ophthalmic solution Place 1 drop into both eyes 2 (two) times daily. 10/23/20  Yes [provider]  pantoprazole (PROTONIX) 40 MG tablet TAKE (1) TABLET BY MOUTH TWICE DAILY BEFORE A MEAL. 06/30/22  Yes Gelene Mink, NP  albuterol (PROVENTIL) (2.5 MG/3ML) 0.083% nebulizer solution Take 3 mLs (2.5 mg total) by nebulization every 6 (six) hours as needed for wheezing or shortness of breath. 12/26/22   Tommie Sams, DO  aspirin 81 MG EC tablet Take 1 tablet (81 mg total) by mouth daily. Patient taking differently: Take 81 mg by mouth in the morning and at bedtime. 04/29/17   Arty Baumgartner, NP  atorvastatin (LIPITOR) 80 MG tablet Take 1 tablet by mouth once daily 02/20/22   Everlene Other G, DO  azelastine (ASTELIN) 0.1 % nasal spray  USE 2 SPRAYS IN EACH NOSTRIL TWICE DAILY. 03/14/22   Nyoka Cowden, MD  Blood Glucose Monitoring Suppl (ACCU-CHEK GUIDE ME) w/Device KIT 1 Piece by Does not apply route as directed. 02/20/22   Roma Kayser, MD  dapagliflozin propanediol (FARXIGA) 10 MG TABS tablet Take 10 mg by mouth daily. 09/08/22   [provider]  Dulaglutide (TRULICITY) 1.5 MG/0.5ML SOPN Inject 1.5 mg into the skin once a week. 06/04/23   Roma Kayser,  MD  lactulose (CHRONULAC) 10 GM/15ML solution TAKE 15 MLS BY MOUTH ONCE DAILY. 08/27/22   Aida Raider, NP  nitroGLYCERIN (NITROSTAT) 0.4 MG SL tablet Place 1 tablet (0.4 mg total) under the tongue every 5 (five) minutes x 3 doses as needed (if no relief after 3rd dose, proceed to ED or call 911). 05/18/23   Antoine Poche, MD  sildenafil (VIAGRA) 25 MG tablet TAKE 1 TABLET BY MOUTH AS NEEDED FOR ERECTILE DYSFUNCTION 1 HOUR PRIOR TO INTERCOURSE. 06/09/23   Tommie Sams, DO    Allergies as of 05/18/2023 - Review Complete 05/06/2023  Allergen Reaction Noted   Carvedilol Other (See Comments) 02/08/2015   Lisinopril Anaphylaxis, Shortness Of Breath, and Swelling 05/30/2015   Chantix [varenicline] Other (See Comments) 07/16/2021   Amoxicillin Nausea Only 02/06/2015    Family History  Problem Relation Age of Onset   Heart attack Father    Stroke Father    Arthritis Father    Heart disease Father    Cancer Mother        ???   Arthritis Mother    Heart disease Brother 51       died in sleep   Early death Brother    Diabetes Maternal Uncle    Alzheimer's disease Maternal Grandmother     Social History   Socioeconomic History   Marital status: Divorced    Spouse name: alice   Number of children: 3   Years of education: 12   Highest education level: Not on file  Occupational History   Occupation: SSI  Tobacco Use   Smoking status: Every Day    Current packs/day: 1.00    Average packs/day: 1 pack/day for 46.0 years (46.0 ttl  pk-yrs)    Types: Cigarettes    Passive exposure: Never   Smokeless tobacco: Former    Types: Chew   Tobacco comments:    Smokes 1 pack a day of cigarettes   Vaping Use   Vaping status: Former  Substance and Sexual Activity   Alcohol use: Yes    Comment: rare beer use   Drug use: No    Comment: history of drug use- marijuana, cocaine- quit 2014   Sexual activity: Yes    Partners: Female    Birth control/protection: None  Other Topics Concern   Not on file  Social History Narrative   Lives with girlfriend [name]   3 children, but 1 OD  In 2019.   Grandchildren-2      Two cats: may and princess; and a dog-foxy      Enjoy: spending time with pets, TV, yard work       Diet: eats all food groups   Caffeine: half and half coffee, some tea-half and half decaf   Water: 6-8 cups daily      Wears seat belt   Does not use phone while driving   Smoke detectors at home      Social Determinants of Health   Financial Resource Strain: Low Risk  (11/14/2022)   Overall Financial Resource Strain (CARDIA)    Difficulty of Paying Living Expenses: Not hard at all  Food Insecurity: No Food Insecurity (11/14/2022)   Hunger Vital Sign    Worried About Running Out of Food in the Last Year: Never true    Ran Out of Food in the Last Year: Never true  Transportation Needs: No Transportation Needs (11/14/2022)   PRAPARE - Administrator, Civil Service (Medical): No  Lack of Transportation (Non-Medical): No  Physical Activity: Inactive (11/14/2022)   Exercise Vital Sign    Days of Exercise per Week: 0 days    Minutes of Exercise per Session: 0 min  Stress: No Stress Concern Present (10/28/2021)   [employer] of Occupational Health - Occupational Stress Questionnaire    Feeling of Stress : Not at all  Social Connections: Moderately Integrated (11/14/2022)   Social Connection and Isolation Panel [NHANES]    Frequency of Communication with Friends and Family: More than three  times a week    Frequency of Social Gatherings with Friends and Family: Once a week    Attends Religious Services: More than 4 times per year    Active Member of [employer] or Organizations: Yes    Attends Engineer, structural: More than 4 times per year    Marital Status: Divorced  Intimate Partner Violence: Not At Risk (11/14/2022)   Humiliation, Afraid, Rape, and Kick questionnaire    Fear of Current or Ex-Partner: No    Emotionally Abused: No    Physically Abused: No    Sexually Abused: No    Review of Systems: See HPI, otherwise negative ROS  Physical Exam: BP (!) 142/90   Pulse 80   Temp 98.2 F (36.8 C) (Oral)   Resp 16   SpO2 98%  General:   Alert,  Well-developed, well-nourished, pleasant and cooperative in NAD Lungs:  Clear throughout to auscultation.   No wheezes, crackles, or rhonchi. No acute distress. Heart:  Regular rate and rhythm; no murmurs, clicks, rubs,  or gallops. Abdomen: Non-distended, normal bowel sounds.  Soft and nontender without appreciable mass or hepatosplenomegaly.   Impression/Plan: 57 year old gentleman with a EtOH related cirrhosis history grade 1 esophageal varices.  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  Undulating Z-line noted at that time.  No dysphagia.  Surveillance EGD The risks, benefits, limitations, alternatives and imponderables have been reviewed with the patient. Questions have been answered. All parties are agreeable.    Additionally, intolerant to beta-blockade.     Notice: This dictation was prepared with Dragon dictation along with smaller phrase technology. Any transcriptional errors that result from this process are unintentional and may not be corrected upon review.

## 2023-06-15 NOTE — Anesthesia Procedure Notes (Signed)
Date/Time: 06/15/2023 7:39 AM  Performed by: Julian Reil, CRNAPre-anesthesia Checklist: Patient identified, Emergency Drugs available, Suction available and Patient being monitored Patient Re-evaluated:Patient Re-evaluated prior to induction Oxygen Delivery Method: Nasal cannula Induction Type: IV induction Placement Confirmation: positive ETCO2 Comments: Optiflow High Flow [place] O2 used.

## 2023-06-15 NOTE — Discharge Instructions (Addendum)
EGD Discharge instructions Please read the instructions outlined below and refer to this sheet in the next few weeks. These discharge instructions provide you with general information on caring for yourself after you leave the hospital. Your doctor may also give you specific instructions. While your treatment has been planned according to the most current medical practices available, unavoidable complications occasionally occur. If you have any problems or questions after discharge, please call your doctor. ACTIVITY You may resume your regular activity but move at a slower pace for the next 24 hours.  Take frequent rest periods for the next 24 hours.  Walking will help expel (get rid of) the air and reduce the bloated feeling in your abdomen.  No driving for 24 hours (because of the anesthesia (medicine) used during the test).  You may shower.  Do not sign any important legal documents or operate any machinery for 24 hours (because of the anesthesia used during the test).  NUTRITION Drink plenty of fluids.  You may resume your normal diet.  Begin with a light meal and progress to your normal diet.  Avoid alcoholic beverages for 24 hours or as instructed by your caregiver.  MEDICATIONS You may resume your normal medications unless your caregiver tells you otherwise.  WHAT YOU CAN EXPECT TODAY You may experience abdominal discomfort such as a feeling of fullness or "gas" pains.  FOLLOW-UP Your doctor will discuss the results of your test with you.  SEEK IMMEDIATE MEDICAL ATTENTION IF ANY OF THE FOLLOWING OCCUR: Excessive nausea (feeling sick to your stomach) and/or vomiting.  Severe abdominal pain and distention (swelling).  Trouble swallowing.  Temperature over 101 F (37.8 C).  Rectal bleeding or vomiting of blood.    Your varicose veins are stable.  Biopsy taken of your distal esophagus because of a small amount of Barrett's.  Recommend you stop drinking alcohol entirely  Further  recommendations to follow pending review of pathology report  Tentatively plan for repeat EGD in 18 months  Office visit with Lewie Loron in our office in 6 months  At patient request, I called Augustine Radar at [phone] reviewed findings and recommendations

## 2023-06-15 NOTE — Op Note (Signed)
[facility] Patient Name: Randy Hebert Procedure Date: 06/15/2023 7:02 AM MRN: [id number] Date of Birth: March 13, 1966 Attending MD: Gennette Pac , MD, [id number] CSN: [id number] Age: 57 Admit Type: Outpatient Procedure:                Upper GI endoscopy Indications:              Esophageal varices Providers:                Gennette Pac, MD, Angelica Ran, Francoise Ceo                            RN, RN, Elinor Parkinson Referring MD:              Medicines:                Propofol per Anesthesia Complications:            No immediate complications. Estimated Blood Loss:     Estimated blood loss was minimal. Procedure:                Pre-Anesthesia Assessment:                           - Prior to the procedure, a History and Physical                            was performed, and patient medications and                            allergies were reviewed. The patient's tolerance of                            previous anesthesia was also reviewed. The risks                            and benefits of the procedure and the sedation                            options and risks were discussed with the patient.                            All questions were answered, and informed consent                            was obtained. Prior Anticoagulants: The patient has                            taken no anticoagulant or antiplatelet agents. ASA                            Grade Assessment: III - A patient with severe                            systemic disease. After reviewing the risks and  benefits, the patient was deemed in satisfactory                            condition to undergo the procedure.                           After obtaining informed consent, the endoscope was                            passed under direct vision. Throughout the                            procedure, the patient's blood pressure, pulse, and                             oxygen saturations were monitored continuously. The                            [id number]-[id number] ([id number]) scope was introduced through the                            mouth, and advanced to the second part of duodenum.                            The upper GI endoscopy was accomplished without                            difficulty. The patient tolerated the procedure                            well. Scope In: 7:38:07 AM Scope Out: 7:43:34 AM Total Procedure Duration: 0 hours 5 minutes 27 seconds  Findings:      2 short columns grade 1 esophageal varices. Undulating Z-line however 2       cm "tongue" of salmon-colored epithelium coming up above the Z-line. No       esophagitis. No nodularity.      Mild portal hypertensive gastropathy was found in the entire examined       stomach.      The duodenal bulb and second portion of the duodenum were normal. Single       biopsy of the tongue salmon-colored epithelium taken. Impression:               - Innocent appearing grade 1 esophageal varices?"2                            columns. Abnormal distal esophagus consistent with                            prior diagnosis of Barrett's esophagus. Status post                            biopsy.                           - Portal hypertensive gastropathy.                           -  Normal duodenal bulb and second portion of the                            duodenum.                           - Moderate Sedation:      Moderate (conscious) sedation was personally administered by an       anesthesia professional. The following parameters were monitored: oxygen       saturation, heart rate, blood pressure, respiratory rate, EKG, adequacy       of pulmonary ventilation, and response to care. Recommendation:           - Patient has a contact number available for                            emergencies. The signs and symptoms of potential                            delayed complications were discussed with the                             patient. Return to normal activities tomorrow.                            Written discharge instructions were provided to the                            patient.                           - Advance diet as tolerated.                           - Continue present medications.                           - Return to my office in 6 months; surveillance EGD                            in 18 months. Patient continues to drink. Follow-up                            on pathology. Procedure Code(s):        --- Professional ---                           [phone], Esophagogastroduodenoscopy, flexible,                            transoral; diagnostic, including collection of                            specimen(s) by brushing or washing, when performed                            (separate procedure) Diagnosis  Code(s):        --- Professional ---                           K76.6, Portal hypertension                           K31.89, Other diseases of stomach and duodenum                           I85.00, Esophageal varices without bleeding CPT copyright 2022 American Medical Association. All rights reserved. The codes documented in this report are preliminary and upon coder review may  be revised to meet current compliance requirements. Randy Hebert. Randy Ricklefs, MD Gennette Pac, MD 06/15/2023 8:18:09 AM This report has been signed electronically. Number of Addenda: 0

## 2023-06-16 ENCOUNTER — Encounter: Payer: Self-pay | Admitting: Internal Medicine

## 2023-06-16 LAB — SURGICAL PATHOLOGY

## 2023-06-19 NOTE — Anesthesia Postprocedure Evaluation (Signed)
Anesthesia Post Note  Patient: Randy Hebert  Procedure(s) Performed: ESOPHAGOGASTRODUODENOSCOPY (EGD) WITH PROPOFOL BIOPSY  Patient location during evaluation: Phase II Anesthesia Type: General Level of consciousness: awake Pain management: pain level controlled Vital Signs Assessment: post-procedure vital signs reviewed and stable Respiratory status: spontaneous breathing and respiratory function stable Cardiovascular status: blood pressure returned to baseline and stable Postop Assessment: no headache and no apparent nausea or vomiting Anesthetic complications: no Comments: Late entry   No notable events documented.   Last Vitals:  Vitals:   06/15/23 0747 06/15/23 0753  BP: 98/62 97/68  Pulse: 66   Resp: (!) 22   Temp: 36.5 C   SpO2: 94%     Last Pain:  Vitals:   06/15/23 0747  TempSrc: Oral  PainSc: 0-No pain                 Windell Norfolk

## 2023-06-23 ENCOUNTER — Encounter (HOSPITAL_COMMUNITY): Payer: Self-pay | Admitting: Internal Medicine

## 2023-06-25 ENCOUNTER — Ambulatory Visit: Payer: 59 | Admitting: Cardiology

## 2023-06-26 ENCOUNTER — Ambulatory Visit: Payer: 59 | Admitting: Family Medicine

## 2023-07-21 ENCOUNTER — Other Ambulatory Visit: Payer: Self-pay | Admitting: "Endocrinology

## 2023-07-21 DIAGNOSIS — E1159 Type 2 diabetes mellitus with other circulatory complications: Secondary | ICD-10-CM

## 2023-07-22 ENCOUNTER — Other Ambulatory Visit: Payer: Self-pay | Admitting: "Endocrinology

## 2023-07-22 ENCOUNTER — Other Ambulatory Visit: Payer: Self-pay

## 2023-07-22 DIAGNOSIS — K7030 Alcoholic cirrhosis of liver without ascites: Secondary | ICD-10-CM

## 2023-07-22 MED ORDER — LACTULOSE 10 GM/15ML PO SOLN: 10.0000 g | Freq: Every day | ORAL | 0 refills | Status: DC

## 2023-07-29 ENCOUNTER — Telehealth: Payer: Self-pay | Admitting: "Endocrinology

## 2023-07-29 NOTE — Telephone Encounter (Signed)
Pt is asking if his DMV paperwork has been done because he needs it next week

## 2023-07-29 NOTE — Telephone Encounter (Signed)
Pt made aware

## 2023-07-29 NOTE — Telephone Encounter (Signed)
Have you received any paperwork back from Dr.Nida?

## 2023-07-29 NOTE — Telephone Encounter (Signed)
Dr Fransico Him has it. He just got it Tuesday.

## 2023-08-18 ENCOUNTER — Other Ambulatory Visit: Payer: Self-pay

## 2023-08-18 DIAGNOSIS — E1159 Type 2 diabetes mellitus with other circulatory complications: Secondary | ICD-10-CM

## 2023-08-18 MED ORDER — TRULICITY 1.5 MG/0.5ML ~~LOC~~ SOAJ: 1.5000 mg | SUBCUTANEOUS | 1 refills | Status: DC

## 2023-08-28 ENCOUNTER — Telehealth: Payer: Self-pay | Admitting: Pharmacist

## 2023-08-28 DIAGNOSIS — E1159 Type 2 diabetes mellitus with other circulatory complications: Secondary | ICD-10-CM

## 2023-08-28 MED ORDER — TRULICITY 1.5 MG/0.5ML ~~LOC~~ SOAJ: 1.5000 mg | SUBCUTANEOUS | 1 refills | Status: DC

## 2023-08-28 NOTE — Telephone Encounter (Signed)
   This patient is appearing on a report for being at risk of failing the adherence measure for diabetes medications this calendar year.   Medication: Trulicity  Last fill date: 07/22/23 for 30 day supply  Insurance report was not up to date. No action needed at this time.  Additional refills sent it for patient  Kieth Brightly, PharmD, BCACP, CPP Clinical Pharmacist, [facility]

## 2023-09-12 ENCOUNTER — Encounter (HOSPITAL_COMMUNITY): Payer: Self-pay | Admitting: Emergency Medicine

## 2023-09-12 ENCOUNTER — Other Ambulatory Visit: Payer: Self-pay

## 2023-09-12 ENCOUNTER — Emergency Department (HOSPITAL_COMMUNITY)
Admission: EM | Admit: 2023-09-12 | Discharge: 2023-09-12 | Disposition: A | Discharge status: Home / Self Care | Payer: 59 | Attending: Emergency Medicine | Admitting: Emergency Medicine

## 2023-09-12 DIAGNOSIS — I5040 Unspecified combined systolic (congestive) and diastolic (congestive) heart failure: Secondary | ICD-10-CM | POA: Insufficient documentation

## 2023-09-12 DIAGNOSIS — I2510 Atherosclerotic heart disease of native coronary artery without angina pectoris: Secondary | ICD-10-CM | POA: Diagnosis not present

## 2023-09-12 DIAGNOSIS — Z7982 Long term (current) use of aspirin: Secondary | ICD-10-CM | POA: Diagnosis not present

## 2023-09-12 DIAGNOSIS — R519 Headache, unspecified: Secondary | ICD-10-CM | POA: Insufficient documentation

## 2023-09-12 DIAGNOSIS — Z7984 Long term (current) use of oral hypoglycemic drugs: Secondary | ICD-10-CM | POA: Insufficient documentation

## 2023-09-12 DIAGNOSIS — E1159 Type 2 diabetes mellitus with other circulatory complications: Secondary | ICD-10-CM

## 2023-09-12 DIAGNOSIS — E1165 Type 2 diabetes mellitus with hyperglycemia: Secondary | ICD-10-CM | POA: Diagnosis not present

## 2023-09-12 LAB — COMPREHENSIVE METABOLIC PANEL
ALT: 57 U/L — ABNORMAL HIGH (ref 0–44)
AST: 42 U/L — ABNORMAL HIGH (ref 15–41)
Albumin: 3.7 g/dL (ref 3.5–5.0)
Alkaline Phosphatase: 95 U/L (ref 38–126)
Anion gap: 9 (ref 5–15)
BUN: 9 mg/dL (ref 6–20)
CO2: 24 mmol/L (ref 22–32)
Calcium: 8.9 mg/dL (ref 8.9–10.3)
Chloride: 98 mmol/L (ref 98–111)
Creatinine, Ser: 0.63 mg/dL (ref 0.61–1.24)
GFR, Estimated: >60 mL/min (ref >60)
Glucose, Bld: 327 mg/dL — ABNORMAL HIGH (ref 70–99)
Potassium: 3.7 mmol/L (ref 3.5–5.1)
Sodium: 131 mmol/L — ABNORMAL LOW (ref 135–145)
Total Bilirubin: 1 mg/dL (ref 0.0–1.2)
Total Protein: 6.9 g/dL (ref 6.5–8.1)

## 2023-09-12 LAB — CBC WITH DIFFERENTIAL/PLATELET
Abs Immature Granulocytes: 0.03 10*3/uL (ref 0.00–0.07)
Basophils Absolute: 0 10*3/uL (ref 0.0–0.1)
Basophils Relative: 1 %
Eosinophils Absolute: 0.2 10*3/uL (ref 0.0–0.5)
Eosinophils Relative: 3 %
HCT: 45.2 % (ref 39.0–52.0)
Hemoglobin: 15.9 g/dL (ref 13.0–17.0)
Immature Granulocytes: 1 %
Lymphocytes Relative: 20 %
Lymphs Abs: 1.2 10*3/uL (ref 0.7–4.0)
MCH: 31.9 pg (ref 26.0–34.0)
MCHC: 35.2 g/dL (ref 30.0–36.0)
MCV: 90.6 fL (ref 80.0–100.0)
Monocytes Absolute: 0.5 10*3/uL (ref 0.1–1.0)
Monocytes Relative: 7 %
Neutro Abs: 4.3 10*3/uL (ref 1.7–7.7)
Neutrophils Relative %: 68 %
Platelets: 160 10*3/uL (ref 150–400)
RBC: 4.99 MIL/uL (ref 4.22–5.81)
RDW: 12.5 % (ref 11.5–15.5)
WBC: 6.3 10*3/uL (ref 4.0–10.5)
nRBC: 0 % (ref 0.0–0.2)

## 2023-09-12 LAB — URINALYSIS, W/ REFLEX TO CULTURE (INFECTION SUSPECTED)
Bacteria, UA: NONE SEEN
Bilirubin Urine: NEGATIVE
Glucose, UA: >=500 mg/dL — AB
Hgb urine dipstick: NEGATIVE
Ketones, ur: NEGATIVE mg/dL
Leukocytes,Ua: NEGATIVE
Nitrite: NEGATIVE
Protein, ur: NEGATIVE mg/dL
Specific Gravity, Urine: 1.026 (ref 1.005–1.030)
pH: 7 (ref 5.0–8.0)

## 2023-09-12 LAB — HEMOGLOBIN A1C
Hgb A1c MFr Bld: 9.7 % — ABNORMAL HIGH (ref 4.8–5.6)
Mean Plasma Glucose: 231.69 mg/dL

## 2023-09-12 LAB — CBG MONITORING, ED: Glucose-Capillary: 386 mg/dL — ABNORMAL HIGH (ref 70–99)

## 2023-09-12 MED ORDER — METFORMIN HCL 500 MG PO TABS: 500.0000 mg | ORAL_TABLET | Freq: Two times a day (BID) | ORAL | 0 refills | Status: DC

## 2023-09-12 MED ORDER — PROCHLORPERAZINE EDISYLATE 10 MG/2ML IJ SOLN
10.0000 mg | Freq: Once | INTRAMUSCULAR | Status: AC
  Administered 2023-09-12: 10 mg via INTRAVENOUS
  Filled 2023-09-12: qty 2

## 2023-09-12 MED ORDER — SODIUM CHLORIDE 0.9 % IV BOLUS: 1000.0000 mL | Freq: Once | INTRAVENOUS | Status: DC

## 2023-09-12 MED ORDER — GLIPIZIDE ER 5 MG PO TB24: 5.0000 mg | ORAL_TABLET | Freq: Every day | ORAL | 0 refills | Status: DC

## 2023-09-12 NOTE — ED Triage Notes (Addendum)
 Pt states his blood sugar was 246 this morning and that he has been having dizziness x 2 weeks with recent headache. States was recently taken off Metformin and Glipizide because he missed his last MD appointment.

## 2023-09-12 NOTE — ED Provider Notes (Signed)
 [place] EMERGENCY DEPARTMENT AT [facility] Provider Note   CSN: [id number] Arrival date & time: 09/12/23  9376     History  Chief complaint: Dizziness  Randy Hebert is a 58 y.o. male.  The history is provided by the patient.  He has history of pretension, diabetes, hyperlipidemia, coronary artery disease, combined systolic and diastolic heart failure, lung cancer, cirrhosis of the liver comes in for generally not feeling well.  He states that he had to cancel an appointment with his endocrinologist, and has been out of glipizide  and metformin .  He is vague about amount of time but states that his been more than a month and possibly several months.  He has been checking his blood sugars at home, and they have been over 200 every day for at least the last month.  He is complaining of dizziness when he wakes up, occipital headaches.  He denies fever, chills, sweats.  He denies nausea, vomiting.  He denies constipation or diarrhea.  He has been taking acetaminophen  which has been giving him temporary relief of his headaches.   Home Medications Prior to Admission medications   Medication Sig Start Date End Date Taking? Authorizing Provider  acetaminophen  (TYLENOL ) 500 MG tablet Take 2 tablets (1,000 mg total) by mouth every 8 (eight) hours as needed for mild pain or fever. 09/06/21   Jens Durand, MD  albuterol  (PROVENTIL ) (2.5 MG/3ML) 0.083% nebulizer solution Take 3 mLs (2.5 mg total) by nebulization every 6 (six) hours as needed for wheezing or shortness of breath. 12/26/22   Cook, Jayce G, DO  albuterol  (VENTOLIN  HFA) 108 (90 Base) MCG/ACT inhaler INHALE 2 PUFFS INTO THE LUNGS EVERY 6 HOURS AS NEEDEDFOR SHORTNESS OF BREATH OR WHEEZING. 08/08/21   Ambs, Arlean CHRISTELLA, FNP  amLODipine  (NORVASC ) 10 MG tablet TAKE 1 TABLET BY MOUTH ONCE A DAY. 04/07/23   Cook, Jayce G, DO  ascorbic acid  (VITAMIN C ) 500 MG tablet Take 1 tablet (500 mg total) by mouth daily. 10/12/19   Ricky Fines, MD   aspirin  81 MG EC tablet Take 1 tablet (81 mg total) by mouth daily. Patient taking differently: Take 81 mg by mouth in the morning and at bedtime. 04/29/17   Henry Manuelita NOVAK, NP  atorvastatin  (LIPITOR ) 80 MG tablet Take 1 tablet by mouth once daily 02/20/22   Cook, Jayce G, DO  azelastine  (ASTELIN ) 0.1 % nasal spray USE 2 SPRAYS IN EACH NOSTRIL TWICE DAILY. 03/14/22   Darlean Ozell NOVAK, MD  Blood Glucose Monitoring Suppl (ACCU-CHEK GUIDE ME) w/Device KIT 1 Piece by Does not apply route as directed. 02/20/22   Nida, Gebreselassie W, MD  budesonide -formoterol  (SYMBICORT ) 160-4.5 MCG/ACT inhaler Take 2 puffs first thing in am and then another 2 puffs about 12 hours later. 03/05/22   Darlean Ozell NOVAK, MD  buPROPion  (WELLBUTRIN  SR) 150 MG 12 hr tablet Take 1 tablet (150 mg total) by mouth 2 (two) times daily. 12/25/22   Cook, Jayce G, DO  Cholecalciferol  (VITAMIN D3) 125 MCG (5000 UT) CAPS Take 1 capsule (5,000 Units total) by mouth daily. 07/19/19   Nida, Gebreselassie W, MD  citalopram  (CELEXA ) 20 MG tablet Take 1 tablet (20 mg total) by mouth daily. 03/25/22   Cook, Jayce G, DO  cycloSPORINE  (RESTASIS ) 0.05 % ophthalmic emulsion Place 1 drop into both eyes 2 (two) times daily.    [provider]  dapagliflozin  propanediol (FARXIGA ) 10 MG TABS tablet Take 10 mg by mouth daily. 09/08/22   [provider]  Dulaglutide  (TRULICITY ) 1.5 MG/0.5ML SOAJ Inject 1.5 mg into the skin once a week. 08/28/23   Cook, Jayce G, DO  ezetimibe  (ZETIA ) 10 MG tablet Take 1 tablet (10 mg total) by mouth daily. 01/04/23   Cook, Jayce G, DO  ferrous sulfate  325 (65 FE) MG tablet Take 1 tablet (325 mg total) by mouth daily with breakfast. 04/02/21   Elnor Fairy HERO, NP  furosemide  (LASIX ) 40 MG tablet Take 1 tablet (40 mg total) by mouth daily as needed for fluid. 07/03/22   Alvan Dorn FALCON, MD  glipiZIDE  (GLUCOTROL  XL) 5 MG 24 hr tablet TAKE 1 TABLET BY MOUTH DAILY WITH BREAKFAST. 08/08/22   Nida, Gebreselassie W, MD   glucose blood (ACCU-CHEK GUIDE TEST) test strip USE AS DIRECTED TO CHECK BLOOD GLUCOSE TWICE DAILY. 07/22/23   Lenis Ethelle ORN, MD  lactulose  (CHRONULAC ) 10 GM/15ML solution Take 15 mLs (10 g total) by mouth daily. 07/22/23   Shirlean Therisa ORN, NP  magnesium  gluconate (MAGONATE) 500 MG tablet Take 500 mg by mouth 2 (two) times daily.    [provider]  metFORMIN  (GLUCOPHAGE ) 500 MG tablet TAKE 1 TABLET BY MOUTH TWICE DAILY WITH MEALS. 04/16/22   Nida, Gebreselassie W, MD  mometasone  (ELOCON ) 0.1 % cream Apply 1 application topically daily as needed (eczema on ears). 10/21/18   [provider]  NEOMYCIN -POLYMYXIN-HYDROCORTISONE  (CORTISPORIN) 1 % SOLN OTIC solution INSTILL 3 DROPS INTO AFFECTED EAR(S) 3 TIMES DAILY AS NEEDED. 03/09/23   Cook, Jayce G, DO  nitroGLYCERIN  (NITROSTAT ) 0.4 MG SL tablet Place 1 tablet (0.4 mg total) under the tongue every 5 (five) minutes x 3 doses as needed (if no relief after 3rd dose, proceed to ED or call 911). 05/18/23   Alvan Dorn FALCON, MD  olopatadine  (PATANOL) 0.1 % ophthalmic solution Place 1 drop into both eyes 2 (two) times daily. 10/23/20   [provider]  pantoprazole  (PROTONIX ) 40 MG tablet TAKE (1) TABLET BY MOUTH TWICE DAILY BEFORE A MEAL. 06/30/22   Shirlean Therisa ORN, NP  sildenafil  (VIAGRA ) 25 MG tablet TAKE 1 TABLET BY MOUTH AS NEEDED FOR ERECTILE DYSFUNCTION 1 HOUR PRIOR TO INTERCOURSE. 06/09/23   Cook, Jayce G, DO      Allergies    Carvedilol , Lisinopril , Chantix  [varenicline ], and Amoxicillin     Review of Systems   Review of Systems  All other systems reviewed and are negative.   Physical Exam Updated Vital Signs BP 139/80 (BP Location: Left Arm)   Pulse 88   Temp 97.8 F (36.6 C) (Oral)   Resp 18   Ht 5' 5 (1.651 m)   Wt 87.1 kg   SpO2 95%   BMI 31.95 kg/m  Physical Exam Vitals and nursing note reviewed.   58 year old male, resting comfortably and in no acute distress. Vital signs are normal. Oxygen   saturation is 95%, which is normal. Head is normocephalic and atraumatic. PERRLA, EOMI. Oropharynx is clear. Neck has mild tenderness at the insertion of the paracervical muscles bilaterally, no midline tenderness.  There is no adenopathy or JVD. Lungs are clear without rales, wheezes, or rhonchi. Chest is nontender. Heart has regular rate and rhythm without murmur. Abdomen is soft, flat, nontender. Extremities have no cyanosis or edema, full range of motion is present. Skin is warm and dry without rash. Neurologic: Mental status is normal, moves all extremities equally.  There is no nystagmus.  Dizziness is not reproduced by passive head movement.  ED Results / Procedures / Treatments  Labs (all labs ordered are listed, but only abnormal results are displayed) Labs Reviewed - No data to display  EKG None  Radiology No results found.  Procedures Procedures    Medications Ordered in ED Medications - No data to display  ED Course/ Medical Decision Making/ A&P                                 Medical Decision Making Amount and/or Complexity of Data Reviewed Labs: ordered.  Risk Prescription drug management.   Dizziness, headache, malaise in the setting of medication noncompliance for his diabetes.  Exam is benign except for tenderness at the insertion of the paracervical muscles and I suspect his headache is a muscle contraction headache.  No red flags to suggest serious pathology such as meningitis or subarachnoid hemorrhage.  Exam is not consistent with vertigo.  I have ordered therapeutic trial of prochlorperazine  for his headache.  I have ordered screening labs of CBC, comprehensive metabolic panel, urinalysis.  I have ordered an electrocardiogram.  I have ordered a 1 month prescription for glipizide  and metformin .  Case is signed out to Dr. Zammit.  Final Clinical Impression(s) / ED Diagnoses Final diagnoses:  Poorly controlled type 2 diabetes mellitus (HCC)   Nonintractable headache, unspecified chronicity pattern, unspecified headache type    Rx / DC Orders ED Discharge Orders     None         Raford Lenis, MD 09/12/23 0740

## 2023-09-12 NOTE — Discharge Instructions (Signed)
Follow-up with your family doctor in the next couple weeks. °

## 2023-09-12 NOTE — ED Notes (Signed)
 Pt assisted to bathroom. Urine sample provided. Pt now back in bed on monitor.

## 2023-09-12 NOTE — ED Provider Notes (Signed)
 Patient with poorly controlled glucose and headache.  Patient improved with Compazine and is given prescriptions for his diabetes.  He will follow-up with his PCP   Bethann Berkshire, MD 09/12/23 [phone]

## 2023-09-18 ENCOUNTER — Telehealth: Payer: Self-pay

## 2023-09-18 NOTE — Progress Notes (Signed)
Transition Care Management Follow-up Telephone Call Date of discharge and from where: 09/12/2023 [facility] How have you been since you were released from the hospital? Patient stated he is feeling 100% better and the medication really helped. Any questions or concerns? No  Items Reviewed: Did the pt receive and understand the discharge instructions provided? Yes  Medications obtained and verified? Yes  Other? No  Any new allergies since your discharge? No  Dietary orders reviewed? Yes Do you have support at home? Yes   Follow up appointments reviewed:  PCP Hospital f/u appt confirmed? No  Scheduled to see  on 11/20/2023 @ [facility] f/u appt confirmed? Yes  Scheduled to see Roma Kayser, MD on 10/08/2023 @ [facility]. Are transportation arrangements needed? No  If their condition worsens, is the pt aware to call PCP or go to the Emergency Dept.? Yes Was the patient provided with contact information for the PCP's office or ED? Yes Was to pt encouraged to call back with questions or concerns? Yes   Jema Deegan [facility]  [facility] Guide Direct Dial: [phone]  Fax: [phone] Website: [email or web address]

## 2023-10-01 ENCOUNTER — Encounter: Payer: Self-pay | Admitting: Internal Medicine

## 2023-10-06 ENCOUNTER — Other Ambulatory Visit: Payer: Self-pay | Admitting: "Endocrinology

## 2023-10-06 DIAGNOSIS — E1159 Type 2 diabetes mellitus with other circulatory complications: Secondary | ICD-10-CM

## 2023-10-08 ENCOUNTER — Ambulatory Visit: Payer: 59 | Admitting: "Endocrinology

## 2023-10-08 ENCOUNTER — Encounter: Payer: Self-pay | Admitting: "Endocrinology

## 2023-10-08 VITALS — BP 122/74 | HR 84 | Ht 65.0 in | Wt 196.0 lb

## 2023-10-08 DIAGNOSIS — Z7984 Long term (current) use of oral hypoglycemic drugs: Secondary | ICD-10-CM | POA: Diagnosis not present

## 2023-10-08 DIAGNOSIS — I10 Essential (primary) hypertension: Secondary | ICD-10-CM

## 2023-10-08 DIAGNOSIS — E782 Mixed hyperlipidemia: Secondary | ICD-10-CM

## 2023-10-08 DIAGNOSIS — E1159 Type 2 diabetes mellitus with other circulatory complications: Secondary | ICD-10-CM | POA: Diagnosis not present

## 2023-10-08 LAB — POCT GLYCOSYLATED HEMOGLOBIN (HGB A1C)

## 2023-10-08 NOTE — Patient Instructions (Signed)

## 2023-10-08 NOTE — Progress Notes (Signed)
 10/08/2023, 2:14 PM      Endocrinology follow-up note    Subjective:    Patient ID: Randy Hebert, male    DOB: [birth year].  Randy Hebert is being seen in  follow-up for management of currently uncontrolled symptomatic type 2 diabetes, hyperlipidemia, hypertension. PMD:  Cook, Jayce G, DO.   Past Medical History:  Diagnosis Date   ACE inhibitor-aggravated angioedema    Allergy     Anemia    Angio-edema    Anxiety    Arthritis    Asthma    hip replacement   Back pain    Bradycardia 04/28/2017   Bulging of cervical intervertebral disc    CAD (coronary artery disease)    lateral STEMI 02/06/2015 00% D1 occlusion treated with Promus Premier 2.5 mm x 16 mm DES, 70% ramus stenosis, 40% mid RCA stenosis, 45% distal RCA stenosis, EF 45-50%   CHF (congestive heart failure) (HCC)    COPD (chronic obstructive pulmonary disease) (HCC)    Depression    Diabetes mellitus without complication (HCC)    Difficult intubation    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   Dry eye    Dyspnea    Early satiety 09/23/2016   Fatty liver    GERD (gastroesophageal reflux disease)    HCAP (healthcare-associated pneumonia) 05/15/2017   Headache    Heart murmur    Hip pain    Hyperlipidemia    Hypertension    Lobar pneumonia (HCC) 05/15/2017   Melena 08/04/2018   MI (myocardial infarction) [facility])    Myocardial infarction (HCC)    Neck pain    Non-ST elevation (NSTEMI) myocardial infarction (HCC) 04/27/2017   NSTEMI (non-ST elevated myocardial infarction) (HCC) 04/26/2017   Otitis media    Pleurisy    Pneumonia due to COVID-19 virus 10/07/2019   Rectal bleeding 11/08/2018   Right shoulder pain 03/20/2020   Sinus pause    9 sec sinus pause on telemetry after started on coreg  after MI, avoid AV nodal blocking agent   Sleep apnea    uses a cpap   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   STEMI (ST elevation myocardial infarction)  (HCC) 05/31/2015   Substance abuse (HCC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   Syncope 06/29/2017   Syncope and collapse 05/15/2017   Transaminitis 08/04/2018   Unilateral primary osteoarthritis, right hip 07/01/2016   Past Surgical History:  Procedure Laterality Date   BIOPSY  10/09/2016   Procedure: BIOPSY;  Surgeon: Lamar CHRISTELLA Hollingshead, MD;  [facility];  Service: Endoscopy;;   BIOPSY  11/28/2019   Procedure: BIOPSY;  Surgeon: Hollingshead Lamar CHRISTELLA, MD;  [facility]: [facility];  Service: Endoscopy;;   BIOPSY  06/15/2023   Procedure: BIOPSY;  Surgeon: Hollingshead Lamar CHRISTELLA, MD;  [facility];  Service: Endoscopy;;   BRONCHIAL BIOPSY  10/16/2022   Procedure: BRONCHIAL BIOPSIES;  Surgeon: Gladis Leonor CHRISTELLA, MD;  Location: [facility] ENDOSCOPY;  Service: Pulmonary;;   BRONCHIAL NEEDLE ASPIRATION BIOPSY  10/16/2022   Procedure: BRONCHIAL NEEDLE ASPIRATION BIOPSIES;  Surgeon: Gladis Leonor CHRISTELLA, MD;  Location: [facility] ENDOSCOPY;  Service: Pulmonary;;   BRONCHIAL WASHINGS  10/16/2022   Procedure: BRONCHIAL WASHINGS;  Surgeon: Gladis Leonor CHRISTELLA, MD;  Location: [facility] ENDOSCOPY;  Service: Pulmonary;;   CARDIAC CATHETERIZATION N/A 02/06/2015   Procedure: Left Heart Cath and Coronary Angiography;  Surgeon: Alm LELON Clay, MD;  Location: [facility] INVASIVE CV LAB;  Service:  Cardiovascular;  Laterality: N/A;   CARDIAC CATHETERIZATION N/A 02/06/2015   Procedure: Coronary Stent Intervention;  Surgeon: Alm LELON Clay, MD;  Location: [facility] INVASIVE CV LAB;  Service: Cardiovascular;  Laterality: N/A;   COLONOSCOPY WITH PROPOFOL  N/A 10/09/2016   Sigmoid and descending colon diverticulosis, four 4-6 mm polyps in sigmoid, one 4 mm polyp in descending. Tubular adenomas and hyperplastic. 5 year surveillance.    COLONOSCOPY WITH PROPOFOL  N/A 11/24/2016   Sigmoid and descending colon diverticulosis, four 4-6 mm polyps in sigmoid, one 4 mm polyp in descending. Tubular adenomas and hyperplastic. 5 year surveillance.    COLONOSCOPY WITH PROPOFOL  N/A  10/18/2021   Carver: nonbleeding internal hemorrhoids, small and large mouth diverticula found in the sigmoid, descending, transverse colon.  A 9 mm sessile polyp was found in the ascending colon that was removed.  4 sessile polyps found in the sigmoid, descending, transverse colon 3 to 5 mm in size, examining otherwise.  Path revealed tubular adenomas.  Repeat due in 5 years for surveillance.   CORONARY ANGIOPLASTY WITH STENT PLACEMENT  01/2015   CORONARY STENT INTERVENTION N/A 04/27/2017   Procedure: CORONARY STENT INTERVENTION;  Surgeon: Mady Bruckner, MD;  Location: [facility] INVASIVE CV LAB;  Service: Cardiovascular;  Laterality: N/A;   ELECTROPHYSIOLOGY STUDY N/A 06/29/2017   Procedure: ELECTROPHYSIOLOGY STUDY;  Surgeon: Waddell Danelle LELON, MD;  Location: [facility] INVASIVE CV LAB;  Service: Cardiovascular;  Laterality: N/A;   ESOPHAGOGASTRODUODENOSCOPY (EGD) WITH PROPOFOL  N/A 10/09/2016   Dr. Shaaron: LA grade a esophagitis.  Barrett's esophagus, biopsy-proven.  Small hiatal hernia.  EGD February 2019   ESOPHAGOGASTRODUODENOSCOPY (EGD) WITH PROPOFOL  N/A 11/28/2019    salmon-colored esophageal mucosa (Barrett's) small hiatal hernia, portal hypertensive gastropathy, normal duodenum, 3 year surveillance   ESOPHAGOGASTRODUODENOSCOPY (EGD) WITH PROPOFOL  N/A 09/06/2021   three columns of grade 1 varices in distal esophagus, no stigmata of bleeding or red wale signs. Small hiatal hernia. Mild portal gastropathy. Normal duodenum. Repeat upper endoscopy in 1 year for surveillance.   ESOPHAGOGASTRODUODENOSCOPY (EGD) WITH PROPOFOL  N/A 06/15/2023   Procedure: ESOPHAGOGASTRODUODENOSCOPY (EGD) WITH PROPOFOL ;  Surgeon: Shaaron Lamar HERO, MD;  [facility];  Service: Endoscopy;  Laterality: N/A;  830am, asa 3   FIDUCIAL MARKER PLACEMENT  10/16/2022   Procedure: FIDUCIAL MARKER PLACEMENT;  Surgeon: Gladis Leonor HERO, MD;  Location: [facility] ENDOSCOPY;  Service: Pulmonary;;   INCISION / DRAINAGE HAND / FINGER     LEFT  HEART CATH AND CORONARY ANGIOGRAPHY N/A 04/27/2017   Procedure: LEFT HEART CATH AND CORONARY ANGIOGRAPHY;  Surgeon: Mady Bruckner, MD;  Location: [facility] INVASIVE CV LAB;  Service: Cardiovascular;  Laterality: N/A;   LOOP RECORDER INSERTION  06/29/2017   Procedure: Loop Recorder Insertion;  Surgeon: Waddell Danelle LELON, MD;  Location: [facility] INVASIVE CV LAB;  Service: Cardiovascular;;   POLYPECTOMY  11/24/2016   Procedure: POLYPECTOMY;  Surgeon: Lamar HERO Shaaron, MD;  [facility];  Service: Endoscopy;;  descending and sigmoid   POLYPECTOMY  10/18/2021   Procedure: POLYPECTOMY;  Surgeon: Cindie Carlin POUR, DO;  [facility];  Service: Endoscopy;;   TOTAL HIP ARTHROPLASTY Right 07/01/2016   TOTAL HIP ARTHROPLASTY Right 07/01/2016   Procedure: RIGHT TOTAL HIP ARTHROPLASTY ANTERIOR APPROACH;  Surgeon: Bruckner CINDERELLA Poli, MD;  Location: [facility] OR;  Service: Orthopedics;  Laterality: Right;   Social History   Socioeconomic History   Marital status: Divorced    Spouse name: alice   Number of children: 3   Years of education: 12   Highest  education level: Not on file  Occupational History   Occupation: SSI  Tobacco Use   Smoking status: Every Day    Current packs/day: 1.00    Average packs/day: 1 pack/day for 46.0 years (46.0 ttl pk-yrs)    Types: Cigarettes    Passive exposure: Never   Smokeless tobacco: Former    Types: Chew   Tobacco comments:    Smokes 1 pack a day of cigarettes   Vaping Use   Vaping status: Former  Substance and Sexual Activity   Alcohol  use: Yes    Comment: rare beer use   Drug use: No    Comment: history of drug use- marijuana, cocaine- quit 2014   Sexual activity: Yes    Partners: Female    Birth control/protection: None  Other Topics Concern   Not on file  Social History Narrative   Lives with girlfriend [name]   3 children, but 1 OD  In 2019.   Grandchildren-2      Two cats: may and princess; and a dog-foxy      Enjoy: spending time with  pets, TV, yard work       Diet: eats all food groups   Caffeine: half and half coffee, some tea-half and half decaf   Water : 6-8 cups daily      Wears seat belt   Does not use phone while driving   Smoke detectors at home      Social Drivers of Health   Financial Resource Strain: Low Risk  (11/14/2022)   Overall Financial Resource Strain (CARDIA)    Difficulty of Paying Living Expenses: Not hard at all  Food Insecurity: No Food Insecurity (09/18/2023)   Hunger Vital Sign    Worried About Running Out of Food in the Last Year: Never true    Ran Out of Food in the Last Year: Never true  Transportation Needs: No Transportation Needs (09/18/2023)   PRAPARE - Administrator, Civil Service (Medical): No    Lack of Transportation (Non-Medical): No  Physical Activity: Inactive (11/14/2022)   Exercise Vital Sign    Days of Exercise per Week: 0 days    Minutes of Exercise per Session: 0 min  Stress: No Stress Concern Present (10/28/2021)   [employer] of Occupational Health - Occupational Stress Questionnaire    Feeling of Stress : Not at all  Social Connections: Moderately Integrated (11/14/2022)   Social Connection and Isolation Panel [NHANES]    Frequency of Communication with Friends and Family: More than three times a week    Frequency of Social Gatherings with Friends and Family: Once a week    Attends Religious Services: More than 4 times per year    Active Member of [employer] or Organizations: Yes    Attends Banker Meetings: More than 4 times per year    Marital Status: Divorced   Outpatient Encounter Medications as of 10/08/2023  Medication Sig   acetaminophen  (TYLENOL ) 500 MG tablet Take 2 tablets (1,000 mg total) by mouth every 8 (eight) hours as needed for mild pain or fever.   albuterol  (PROVENTIL ) (2.5 MG/3ML) 0.083% nebulizer solution Take 3 mLs (2.5 mg total) by nebulization every 6 (six) hours as needed for wheezing or shortness of breath.    albuterol  (VENTOLIN  HFA) 108 (90 Base) MCG/ACT inhaler INHALE 2 PUFFS INTO THE LUNGS EVERY 6 HOURS AS NEEDEDFOR SHORTNESS OF BREATH OR WHEEZING.   amLODipine  (NORVASC ) 10 MG tablet TAKE 1 TABLET BY MOUTH ONCE A  DAY.   ascorbic acid  (VITAMIN C ) 500 MG tablet Take 1 tablet (500 mg total) by mouth daily.   aspirin  81 MG EC tablet Take 1 tablet (81 mg total) by mouth daily. (Patient taking differently: Take 81 mg by mouth in the morning and at bedtime.)   atorvastatin  (LIPITOR ) 80 MG tablet Take 1 tablet by mouth once daily   azelastine  (ASTELIN ) 0.1 % nasal spray USE 2 SPRAYS IN EACH NOSTRIL TWICE DAILY.   Blood Glucose Monitoring Suppl (ACCU-CHEK GUIDE ME) w/Device KIT 1 Piece by Does not apply route as directed.   budesonide -formoterol  (SYMBICORT ) 160-4.5 MCG/ACT inhaler Take 2 puffs first thing in am and then another 2 puffs about 12 hours later.   buPROPion  (WELLBUTRIN  SR) 150 MG 12 hr tablet Take 1 tablet (150 mg total) by mouth 2 (two) times daily.   Cholecalciferol  (VITAMIN D3) 125 MCG (5000 UT) CAPS Take 1 capsule (5,000 Units total) by mouth daily.   citalopram  (CELEXA ) 20 MG tablet Take 1 tablet (20 mg total) by mouth daily.   cycloSPORINE  (RESTASIS ) 0.05 % ophthalmic emulsion Place 1 drop into both eyes 2 (two) times daily.   dapagliflozin  propanediol (FARXIGA ) 10 MG TABS tablet Take 10 mg by mouth daily.   Dulaglutide  (TRULICITY ) 1.5 MG/0.5ML SOAJ INJECT 1.5MG  INTO THE SKIN ONCE WEEKLY.   ezetimibe  (ZETIA ) 10 MG tablet Take 1 tablet (10 mg total) by mouth daily.   ferrous sulfate  325 (65 FE) MG tablet Take 1 tablet (325 mg total) by mouth daily with breakfast.   furosemide  (LASIX ) 40 MG tablet Take 1 tablet (40 mg total) by mouth daily as needed for fluid.   glipiZIDE  (GLUCOTROL  XL) 5 MG 24 hr tablet Take 1 tablet (5 mg total) by mouth daily with breakfast.   glucose blood (ACCU-CHEK GUIDE TEST) test strip USE AS DIRECTED TO CHECK BLOOD GLUCOSE TWICE DAILY.   lactulose  (CHRONULAC ) 10  GM/15ML solution Take 15 mLs (10 g total) by mouth daily.   magnesium  gluconate (MAGONATE) 500 MG tablet Take 500 mg by mouth 2 (two) times daily.   metFORMIN  (GLUCOPHAGE ) 500 MG tablet Take 1 tablet (500 mg total) by mouth 2 (two) times daily with a meal.   mometasone  (ELOCON ) 0.1 % cream Apply 1 application topically daily as needed (eczema on ears).   NEOMYCIN -POLYMYXIN-HYDROCORTISONE  (CORTISPORIN) 1 % SOLN OTIC solution INSTILL 3 DROPS INTO AFFECTED EAR(S) 3 TIMES DAILY AS NEEDED.   nitroGLYCERIN  (NITROSTAT ) 0.4 MG SL tablet Place 1 tablet (0.4 mg total) under the tongue every 5 (five) minutes x 3 doses as needed (if no relief after 3rd dose, proceed to ED or call 911).   olopatadine  (PATANOL) 0.1 % ophthalmic solution Place 1 drop into both eyes 2 (two) times daily.   pantoprazole  (PROTONIX ) 40 MG tablet TAKE (1) TABLET BY MOUTH TWICE DAILY BEFORE A MEAL.   sildenafil  (VIAGRA ) 25 MG tablet TAKE 1 TABLET BY MOUTH AS NEEDED FOR ERECTILE DYSFUNCTION 1 HOUR PRIOR TO INTERCOURSE.   No facility-administered encounter medications on file as of 10/08/2023.    ALLERGIES: Allergies  Allergen Reactions   Carvedilol  Other (See Comments)    Sinus pause on telemetry >3 seconds. Longest one 9 sec. No AV nodal agent   Lisinopril  Anaphylaxis, Shortness Of Breath and Swelling    Angioedema, required intubation and mechanical ventilation   Chantix  [Varenicline ] Other (See Comments)    Nightmares and unable to sleep well   Amoxicillin  Nausea Only    Did it involve swelling of the face/tongue/throat,  SOB, or low BP? No Did it involve sudden or severe rash/hives, skin peeling, or any reaction on the inside of your mouth or nose? No Did you need to seek medical attention at a hospital or doctor's office? No When did it last happen? childhood reaction      If all above answers are NO, may proceed with cephalosporin use.     VACCINATION STATUS: Immunization History  Administered Date(s) Administered    Influenza,inj,Quad PF,6+ Mos 07/03/2016, 05/07/2017, 06/20/2020, 07/24/2021, 06/13/2022   Influenza-Unspecified 05/17/2018   Moderna SARS-COV2 Booster Vaccination 03/01/2020   Moderna Sars-Covid-2 Vaccination 02/20/2020   Pneumococcal Conjugate-13 06/17/2017   Pneumococcal Polysaccharide-23 02/07/2015   Tdap 05/17/2019    Diabetes He presents for his follow-up diabetic visit. He has type 2 diabetes mellitus. Onset time: He was diagnosed at approximate age of 49 years. His disease course has been worsening. There are no hypoglycemic associated symptoms. Pertinent negatives for hypoglycemia include no confusion, headaches, pallor or seizures. Pertinent negatives for diabetes include no chest pain, no fatigue, no polydipsia, no polyphagia, no polyuria and no weakness. There are no hypoglycemic complications. Symptoms are worsening. Diabetic complications include heart disease and peripheral neuropathy. Risk factors for coronary artery disease include dyslipidemia, diabetes mellitus, hypertension, male sex, family history, obesity, sedentary lifestyle and tobacco exposure. Current diabetic treatment includes oral agent (monotherapy). He is compliant with treatment most of the time. His weight is fluctuating minimally. He is following a generally unhealthy diet. When asked about meal planning, he reported none. He has had a previous visit with a dietitian. He never participates in exercise. His home blood glucose trend is increasing steadily. His breakfast blood glucose range is generally 140-180 mg/dl. His overall blood glucose range is 140-180 mg/dl. (He presents with his meter showing average blood glucose of 259 for the last 90 days, however improved to 152 for the last 7 days mainly due to the fact that he has resumed his medications for diabetes.   His recent A1c was 9.7% increasing from 6.7% during his last visit. This loss of control seems to be as a result of withdrawal of his medications. ) An ACE  inhibitor/angiotensin II receptor blocker is contraindicated (He has documented allergy  for ACE inhibitor's). He does not see a podiatrist.Eye exam is not current.  Hyperlipidemia This is a chronic problem. The current episode started more than 1 year ago. Exacerbating diseases include diabetes and obesity. Pertinent negatives include no chest pain, myalgias or shortness of breath. Current antihyperlipidemic treatment includes statins. Risk factors for coronary artery disease include dyslipidemia, diabetes mellitus, family history, obesity, male sex, hypertension and a sedentary lifestyle.  Hypertension This is a chronic problem. The current episode started more than 1 year ago. Pertinent negatives include no chest pain, headaches, neck pain, palpitations or shortness of breath. Risk factors for coronary artery disease include diabetes mellitus, dyslipidemia, obesity, sedentary lifestyle and smoking/tobacco exposure. Past treatments include calcium  channel blockers. Hypertensive end-organ damage includes CAD/MI.    Review of systems    Objective:    BP 122/74   Pulse 84   Ht 5' 5 (1.651 m)   Wt 196 lb (88.9 kg)   BMI 32.62 kg/m   Wt Readings from Last 3 Encounters:  10/08/23 196 lb (88.9 kg)  09/12/23 192 lb (87.1 kg)  06/10/23 197 lb 6.4 oz (89.5 kg)     Physical Exam- Limited   CMP     Component Value Date/Time   NA 131 (L) 09/12/2023 0725  NA 138 05/05/2023 0844   K 3.7 09/12/2023 0725   CL 98 09/12/2023 0725   CO2 24 09/12/2023 0725   GLUCOSE 327 (H) 09/12/2023 0725   BUN 9 09/12/2023 0725   BUN 7 05/05/2023 0844   CREATININE 0.63 09/12/2023 0725   CREATININE 0.68 (L) 09/29/2022 0836   CALCIUM  8.9 09/12/2023 0725   PROT 6.9 09/12/2023 0725   PROT 6.9 05/05/2023 0844   ALBUMIN  3.7 09/12/2023 0725   ALBUMIN  4.3 05/05/2023 0844   AST 42 (H) 09/12/2023 0725   ALT 57 (H) 09/12/2023 0725   ALKPHOS 95 09/12/2023 0725   BILITOT 1.0 09/12/2023 0725   BILITOT 1.2  05/05/2023 0844   GFRNONAA >60 09/12/2023 0725   GFRNONAA 108 06/21/2020 0707   GFRAA 127 10/24/2020 0811   GFRAA 125 06/21/2020 0707    Diabetic Labs (most recent): Lab Results  Component Value Date   HGBA1C 9.7 (H) 09/12/2023   HGBA1C 6.7 (H) 12/25/2022   HGBA1C 6.7 06/23/2022   MICROALBUR 1.8 11/09/2019   MICROALBUR 0.7 11/17/2017   MICROALBUR 0.9 04/15/2017     Lipid Panel ( most recent) Lipid Panel     Component Value Date/Time   CHOL 197 12/25/2022 1029   TRIG 319 (H) 12/25/2022 1029   HDL 40 12/25/2022 1029   CHOLHDL 4.9 12/25/2022 1029   CHOLHDL 7.1 (H) 06/21/2020 0707   VLDL 41 (H) 04/27/2017 0505   LDLCALC 103 (H) 12/25/2022 1029   LDLCALC 116 (H) 06/21/2020 0707      Lab Results  Component Value Date   TSH 3.61 06/21/2020   TSH 1.58 11/03/2018   TSH 1.79 11/17/2017   TSH 1.635 04/26/2017   TSH 2.564 06/27/2015   FREET4 1.5 11/03/2018   FREET4 1.4 11/17/2017      Assessment & Plan:   1. DM type 2 causing vascular disease (HCC)  - Randy Hebert has currently uncontrolled symptomatic type 2 DM since  58 years of age.  He presents with his meter showing average blood glucose of 259 for the last 90 days, however improved to 152 for the last 7 days mainly due to the fact that he has resumed his medications for diabetes.   His recent A1c was 9.7% increasing from 6.7% during his last visit. This loss of control seems to be as a result of withdrawal of his medications.   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  He also has recent diagnosis of CHF, coronary artery disease which required stent placement,  and Randy Hebert remains at extremely high risk for more acute and chronic complications which include CAD, CVA, CKD, retinopathy, and neuropathy. These are all discussed in detail with the patient.  - I have counseled him on  diet management and weight loss, by adopting a carbohydrate restricted/protein rich diet. -Still admits to dietary indiscretions including consumption of sweets and sweetened beverages.   he acknowledges that there is a room for improvement in his food and drink choices. - Suggestion is made for him to avoid simple carbohydrates  from his diet including Cakes, Sweet Desserts, Ice Cream, Soda (diet and regular), Sweet Tea, Candies, Chips, Cookies, Store Bought Juices, Alcohol  in Excess of  1-2 drinks a day, Artificial Sweeteners,  Coffee Creamer, and Sugar-free Products, Lemonade. This will help patient to have more stable blood glucose profile and potentially avoid unintended weight gain.  - I encouraged him  to switch to  unprocessed or minimally processed complex starch and increased protein intake (animal or plant source), fruits, and vegetables.  - he is advised to stick to a routine mealtimes to eat 3 meals  a day and avoid unnecessary snacks ( to snack only to correct hypoglycemia).    - I have approached him with the following individualized plan to manage diabetes and patient agrees:   -Admittedly, he was without his Farxiga , metformin  for the majority of the interval time which led to loss of control of diabetes with A1c of 9.7%, however recently he is averaging 150 for the last 14 days.    -He can be managed without insulin .  He is advised to continue Trulicity  1.5 mg subcutaneously weekly, Farxiga  10 mg p.o. daily at breakfast, continue metformin  500 mg p.o. twice daily, and glipizide  5 mg XL p.o. daily at breakfast.    He is approached to continue monitoring blood glucose at least twice a day-daily before breakfast and at bedtime.   -He is encouraged to call clinic for hypoglycemia under 70 or hyperglycemia above 200 mg per DL. The single most important intervention would be smoking cessation and dietary change for him.   The patient was counseled on the dangers of tobacco use, and  was advised to quit.  Reviewed strategies to maximize success, including removing cigarettes and smoking materials from environment.  During his last encounter, whole food plant-based diet was discussed with him to address most of his chronic diseases including type 2 diabetes, fatty liver disease, hyperlipidemia, hypertension, obesity.   -Patient specific target  A1c;  LDL, HDL, Triglycerides, and  Waist Circumference were discussed in detail.  2) BP/HTN:  His blood pressure is controlled to target.  -He remains a chronic heavy smoker. He has a documented allergy  of ACE inhibitors anaphylaxis .  He is advised to continue amlodipine  10 mg p.o. daily, with as needed diuretics.  He is extensively counseled for smoking cessation.     3) Lipids/HPL: Recent labs show LDL increasing to 103.  Admittedly, he still is inconsistent taking his statin.  He is advised to continue atorvastatin  80 mg nightly.  His history of  fatty liver disease, and cirrhosis of liver likely induced by alcohol , complicates his options of treatment to manage dyslipidemia.     4)  Weight/Diet: His BMI is 32.62-- abdominal obesity clearly complicating his diabetes care.  He is a candidate for modest weight loss.  CDE Consult has been initiated and in progress,  exercise, and detailed carbohydrates information provided.  5) Chronic Care/Health Maintenance:  -he  is on Statin medications and  is encouraged to continue to follow up with Ophthalmology, Dentist,  Podiatrist at least yearly or according to recommendations, and advised to  Quit smoking ( is a chronic heavy smoker for the last 40 days), and consider weaning off of alcohol .  I have recommended yearly flu vaccine and pneumonia vaccination at least every 5 years; moderate intensity exercise for up to 150 minutes weekly; and  sleep for at least 7 hours a day.  The patient was counseled on the dangers of tobacco use, and was advised to quit.  Reviewed strategies to maximize  success, including removing cigarettes and smoking materials from environment.   - I advised patient to maintain close follow up with Cook, Jayce G, DO for primary care needs.   I spent  26  minutes in the care of the patient today including review of labs from CMP, Lipids, Thyroid   Function, Hematology (current and previous including abstractions from other facilities); face-to-face time discussing  his blood glucose readings/logs, discussing hypoglycemia and hyperglycemia episodes and symptoms, medications doses, his options of short and long term treatment based on the latest standards of care / guidelines;  discussion about incorporating lifestyle medicine;  and documenting the encounter. Risk reduction counseling performed per USPSTF guidelines to reduce  obesity and cardiovascular risk factors.     Please refer to Patient Instructions for Blood Glucose Monitoring and Insulin /Medications Dosing Guide  in media tab for additional information. Please  also refer to  Patient Self Inventory in the Media  tab for reviewed elements of pertinent patient history.  Randy Hebert participated in the discussions, expressed understanding, and voiced agreement with the above plans.  All questions were answered to his satisfaction. he is encouraged to contact clinic should he have any questions or concerns prior to his return visit.    Follow up plan: - Return in about 3 months (around 01/05/2024) for Bring Meter/CGM Device/Logs- A1c in Office.  Ranny Earl, MD [facility] [address] [place], [place] [address] Phone: [phone]  Fax: [phone]    10/08/2023, 2:14 PM  This note was partially dictated with voice recognition software. Similar sounding words can be transcribed inadequately or may not  be corrected upon review.

## 2023-10-14 ENCOUNTER — Telehealth: Payer: Self-pay | Admitting: Internal Medicine

## 2023-10-14 ENCOUNTER — Ambulatory Visit: Payer: 59 | Attending: Cardiology | Admitting: Cardiology

## 2023-10-14 ENCOUNTER — Encounter: Payer: Self-pay | Admitting: Cardiology

## 2023-10-14 ENCOUNTER — Telehealth: Payer: Self-pay | Admitting: "Endocrinology

## 2023-10-14 ENCOUNTER — Telehealth: Payer: Self-pay | Admitting: *Deleted

## 2023-10-14 VITALS — BP 120/64 | HR 84 | Ht 65.0 in | Wt 197.0 lb

## 2023-10-14 DIAGNOSIS — I2510 Atherosclerotic heart disease of native coronary artery without angina pectoris: Secondary | ICD-10-CM | POA: Diagnosis not present

## 2023-10-14 DIAGNOSIS — I10 Essential (primary) hypertension: Secondary | ICD-10-CM

## 2023-10-14 DIAGNOSIS — C3491 Malignant neoplasm of unspecified part of right bronchus or lung: Secondary | ICD-10-CM

## 2023-10-14 DIAGNOSIS — I5032 Chronic diastolic (congestive) heart failure: Secondary | ICD-10-CM | POA: Diagnosis not present

## 2023-10-14 DIAGNOSIS — E782 Mixed hyperlipidemia: Secondary | ICD-10-CM

## 2023-10-14 DIAGNOSIS — E1159 Type 2 diabetes mellitus with other circulatory complications: Secondary | ICD-10-CM

## 2023-10-14 MED ORDER — ATORVASTATIN CALCIUM 40 MG PO TABS: 40.0000 mg | ORAL_TABLET | Freq: Every day | ORAL | 6 refills | Status: DC

## 2023-10-14 MED ORDER — AMLODIPINE BESYLATE 10 MG PO TABS: 10.0000 mg | ORAL_TABLET | Freq: Every day | ORAL | 1 refills | Status: DC

## 2023-10-14 MED ORDER — EZETIMIBE 10 MG PO TABS: 10.0000 mg | ORAL_TABLET | Freq: Every day | ORAL | 1 refills | Status: DC

## 2023-10-14 MED ORDER — FUROSEMIDE 40 MG PO TABS: 40.0000 mg | ORAL_TABLET | Freq: Every day | ORAL | 1 refills | Status: DC | PRN

## 2023-10-14 NOTE — Telephone Encounter (Signed)
Patient has CT chest las year and is wondering if he needs a follow up ct----patient call back [phone]

## 2023-10-14 NOTE — Telephone Encounter (Signed)
disregard

## 2023-10-14 NOTE — Patient Instructions (Addendum)
Medication Instructions:   Amlodipine, Zetia, Lasix refilled today Decrease Atorvastatin to 40mg  daily - new sent to pharmacy as well  Continue all other medications.     Labwork:  none  Testing/Procedures:  none  Follow-Up:  6 months   Any Other Special Instructions Will Be Listed Below (If Applicable).   If you need a refill on your cardiac medications before your next appointment, please call your pharmacy.

## 2023-10-14 NOTE — Progress Notes (Signed)
Clinical Summary Randy Hebert is a 58 y.o.male seen today for follow up of the following medical problems.      1. CAD - admit 01/2015 with lateral STEMI, received DES to D1. There was a 70% ramus and 40% mid RCA that was medically managed - 01/2015 echo LVEF 40%, lateral wall hypokinesis - no beta blocker due to history of sinus pause and bradycardia. No ACE-I given history of severe angioedema requiring intubation, no ARB due to risk of cross reactivity   04/2017 DES to ramus -07/2018 nuclear stress prior infarct, no current ischemia -07/2022 nuclear stress:  Findings are consistent with prior anterolatera/lateral/inferolateral myocardial infarction. There is no current ischemia.  - 07/2022 echo: LVEF 50-55%, no WMAs, indet diastolic, normal RV      - working out at [employer] 4 days a week. Will walk up to 3 miles on treadmill, tolerates without symptoms - recent poor compliance with meds but working to increase compliance.      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 can have some LE edema.    3. SOB/COPD - 07/2015 PFTs: mild to mod ventilatory defect with small airway obstruction.   - 07/2015 CT PE no PE, though suboptimal study - 07/2015 CXR no acute process.   PFTs with mild to mod vent defect, ABGs showed borderline resting hypoxemia.     - followed by Dr Sherene Sires     3. Hyperlipidemia  - endocrine lowered atorva from 80 to 40mg  due to elevated LFTs.      -reports cramps on atorvastatin, he stopped taking but willing to retry   4. Syncope - nocturnal bradycardia noted on previous monitors, no significant daytime episodes - has loop recorder, followed by EP - no beta blockers due to sinus pauses.       - episode 1 month ago of dizzienss in setting of severe hyperglycemia and dehydration.      5. HTN -he is compliant with meds        6. OSA  -using cpap machine.       SH: within last few years his son overdosed, passed away Girlfriend passed on Thanksgiving. THey lived together, he is working on moving.  Currently in low income apartment Past Medical History:  Diagnosis Date   ACE inhibitor-aggravated angioedema    Allergy    Anemia    Angio-edema    Anxiety    Arthritis    Asthma    hip replacement   Back pain    Bradycardia 04/28/2017   Bulging of cervical intervertebral disc    CAD (coronary artery disease)    lateral STEMI 02/06/2015 00% D1 occlusion treated with Promus Premier 2.5 mm x 16 mm DES, 70% ramus stenosis, 40% mid RCA stenosis, 45% distal RCA stenosis, EF 45-50%   CHF (congestive heart failure) (HCC)    COPD (chronic obstructive pulmonary disease) (HCC)    Depression    Diabetes mellitus without complication (HCC)    Difficult intubation    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   Dry eye    Dyspnea    Early satiety 09/23/2016   Fatty liver    GERD (gastroesophageal reflux disease)    HCAP (healthcare-associated pneumonia) 05/15/2017   Headache    Heart murmur    Hip pain    Hyperlipidemia  Hypertension    Lobar pneumonia (HCC) 05/15/2017   Melena 08/04/2018   MI (myocardial infarction) [facility])    Myocardial infarction [facility])    Neck pain    Non-ST elevation (NSTEMI) myocardial infarction (HCC) 04/27/2017   NSTEMI (non-ST elevated myocardial infarction) (HCC) 04/26/2017   Otitis media    Pleurisy    Pneumonia due to COVID-19 virus 10/07/2019   Rectal bleeding 11/08/2018   Right shoulder pain 03/20/2020   Sinus pause    9 sec sinus pause on telemetry after started on coreg after MI, avoid AV nodal blocking agent   Sleep apnea    uses a cpap   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 Longest one 9 sec. No AV nodal agent   Lisinopril Anaphylaxis, Shortness Of Breath and Swelling    Angioedema, required intubation and mechanical ventilation   Chantix [Varenicline] Other (See Comments)    Nightmares and unable to sleep well   Amoxicillin Nausea Only    Did it involve swelling of the face/tongue/throat, SOB, or low BP? No Did it involve sudden or severe rash/hives, skin peeling, or any reaction on the inside of your mouth or nose? No Did you need to seek medical attention at a hospital or doctor's office? No When did it last happen? childhood reaction      If all above answers are "NO", may proceed with cephalosporin use.      Current Outpatient Medications  Medication Sig Dispense Refill   acetaminophen (TYLENOL) 500 MG tablet Take 2 tablets (1,000 mg total) by mouth every 8 (eight) hours as needed for mild pain or fever.     albuterol (PROVENTIL) (2.5 MG/3ML) 0.083% nebulizer solution Take 3 mLs (2.5 mg total) by nebulization every 6 (six) hours as needed for wheezing or shortness of breath. 150 mL 1   albuterol (VENTOLIN HFA) 108 (90 Base) MCG/ACT inhaler INHALE 2 PUFFS INTO THE LUNGS EVERY 6 HOURS AS NEEDEDFOR SHORTNESS OF BREATH OR WHEEZING. 18 g 1   amLODipine (NORVASC) 10 MG tablet TAKE 1 TABLET BY MOUTH ONCE A DAY. 90 tablet 0   ascorbic acid (VITAMIN C) 500 MG tablet Take 1 tablet (500 mg total) by mouth daily. 30 tablet 1   aspirin 81 MG EC tablet Take 1 tablet (81 mg total) by mouth daily. (Patient taking differently: Take 81 mg by mouth in the morning and at bedtime.) 30 tablet    atorvastatin (LIPITOR) 80 MG tablet Take 1 tablet by mouth once daily 90 tablet 0   azelastine (ASTELIN) 0.1 % nasal spray USE 2 SPRAYS IN EACH NOSTRIL TWICE DAILY. 30 mL 11   Blood Glucose  Monitoring Suppl (ACCU-CHEK GUIDE ME) w/Device KIT 1 Piece by Does not apply route as directed. 1 kit 0   budesonide-formoterol (SYMBICORT) 160-4.5 MCG/ACT inhaler Take 2 puffs first thing in am and then another 2 puffs about 12 hours later. 1 each 12   buPROPion (WELLBUTRIN SR) 150 MG 12 hr tablet Take 1 tablet (150 mg total) by mouth 2 (two) times daily. 180 tablet 0   Cholecalciferol (VITAMIN D3) 125 MCG (5000 UT) CAPS Take  1 capsule (5,000 Units total) by mouth daily. 90 capsule 0   citalopram (CELEXA) 20 MG tablet Take 1 tablet (20 mg total) by mouth daily. 90 tablet 3   cycloSPORINE (RESTASIS) 0.05 % ophthalmic emulsion Place 1 drop into both eyes 2 (two) times daily.     dapagliflozin propanediol (FARXIGA) 10 MG TABS tablet Take 10 mg by mouth daily.     Dulaglutide (TRULICITY) 1.5 MG/0.5ML SOAJ INJECT 1.5MG  INTO THE SKIN ONCE WEEKLY. 2 mL 0   ezetimibe (ZETIA) 10 MG tablet Take 1 tablet (10 mg total) by mouth daily. 90 tablet 3   ferrous sulfate 325 (65 FE) MG tablet Take 1 tablet (325 mg total) by mouth daily with breakfast. 90 tablet 1   furosemide (LASIX) 40 MG tablet Take 1 tablet (40 mg total) by mouth daily as needed for fluid. 30 tablet 11   glipiZIDE (GLUCOTROL XL) 5 MG 24 hr tablet Take 1 tablet (5 mg total) by mouth daily with breakfast. 30 tablet 0   glucose blood (ACCU-CHEK GUIDE TEST) test strip USE AS DIRECTED TO CHECK BLOOD GLUCOSE TWICE DAILY. 100 strip 0   lactulose (CHRONULAC) 10 GM/15ML solution Take 15 mLs (10 g total) by mouth daily. 473 mL 0   magnesium gluconate (MAGONATE) 500 MG tablet Take 500 mg by mouth 2 (two) times daily.     metFORMIN (GLUCOPHAGE) 500 MG tablet Take 1 tablet (500 mg total) by mouth 2 (two) times daily with a meal. 60 tablet 0   mometasone (ELOCON) 0.1 % cream Apply 1 application topically daily as needed (eczema on ears).     NEOMYCIN-POLYMYXIN-HYDROCORTISONE (CORTISPORIN) 1 % SOLN OTIC solution INSTILL 3 DROPS INTO AFFECTED EAR(S) 3 TIMES DAILY  AS NEEDED. 10 mL 0   nitroGLYCERIN (NITROSTAT) 0.4 MG SL tablet Place 1 tablet (0.4 mg total) under the tongue every 5 (five) minutes x 3 doses as needed (if no relief after 3rd dose, proceed to ED or call 911). 25 tablet 3   olopatadine (PATANOL) 0.1 % ophthalmic solution Place 1 drop into both eyes 2 (two) times daily.     pantoprazole (PROTONIX) 40 MG tablet TAKE (1) TABLET BY MOUTH TWICE DAILY BEFORE A MEAL. 180 tablet 3   sildenafil (VIAGRA) 25 MG tablet TAKE 1 TABLET BY MOUTH AS NEEDED FOR ERECTILE DYSFUNCTION 1 HOUR PRIOR TO INTERCOURSE. 10 tablet 0   No current facility-administered medications for this visit.     Past Surgical History:  Procedure Laterality Date   BIOPSY  10/09/2016   Procedure: BIOPSY;  Surgeon: Corbin Ade, MD;  Location: [facility];  Service: Endoscopy;;   BIOPSY  11/28/2019   Procedure: BIOPSY;  Surgeon: Corbin Ade, MD;  Location: [facility];  Service: Endoscopy;;   BIOPSY  06/15/2023   Procedure: BIOPSY;  Surgeon: Corbin Ade, MD;  Location: [facility];  Service: Endoscopy;;   BRONCHIAL BIOPSY  10/16/2022   Procedure: BRONCHIAL BIOPSIES;  Surgeon: Omar Person, MD;  Location: [facility] ENDOSCOPY;  Service: Pulmonary;;   BRONCHIAL NEEDLE ASPIRATION BIOPSY  10/16/2022   Procedure: BRONCHIAL NEEDLE ASPIRATION BIOPSIES;  Surgeon: Omar Person, MD;  Location: [facility] ENDOSCOPY;  Service: Pulmonary;;   BRONCHIAL WASHINGS  10/16/2022   Procedure: BRONCHIAL WASHINGS;  Surgeon: Omar Person, MD;  Location: [facility] ENDOSCOPY;  Service: Pulmonary;;   CARDIAC CATHETERIZATION N/A 02/06/2015   Procedure: Left Heart Cath and Coronary Angiography;  Surgeon: Marykay Lex, MD;  Location: [facility] INVASIVE CV LAB;  Service:  Cardiovascular;  Laterality: N/A;   CARDIAC CATHETERIZATION N/A 02/06/2015   Procedure: Coronary Stent Intervention;  Surgeon: Marykay Lex, MD;  Location: [facility] INVASIVE CV LAB;  Service: Cardiovascular;  Laterality: N/A;   COLONOSCOPY WITH  PROPOFOL N/A 10/09/2016   Sigmoid and descending colon diverticulosis, four 4-6 mm polyps in sigmoid, one 4 mm polyp in descending. Tubular adenomas and hyperplastic. 5 year surveillance.    COLONOSCOPY WITH PROPOFOL N/A 11/24/2016   Sigmoid and descending colon diverticulosis, four 4-6 mm polyps in sigmoid, one 4 mm polyp in descending. Tubular adenomas and hyperplastic. 5 year surveillance.    COLONOSCOPY WITH PROPOFOL N/A 10/18/2021   Carver: nonbleeding internal hemorrhoids, small and large mouth diverticula found in the sigmoid, descending, transverse colon.  A 9 mm sessile polyp was found in the ascending colon that was removed.  4 sessile polyps found in the sigmoid, descending, transverse colon 3 to 5 mm in size, examining otherwise.  Path revealed tubular adenomas.  Repeat due in 5 years for surveillance.   CORONARY ANGIOPLASTY WITH STENT PLACEMENT  01/2015   CORONARY STENT INTERVENTION N/A 04/27/2017   Procedure: CORONARY STENT INTERVENTION;  Surgeon: Yvonne Kendall, MD;  Location: [facility] INVASIVE CV LAB;  Service: Cardiovascular;  Laterality: N/A;   ELECTROPHYSIOLOGY STUDY N/A 06/29/2017   Procedure: ELECTROPHYSIOLOGY STUDY;  Surgeon: Marinus Maw, MD;  Location: [facility] INVASIVE CV LAB;  Service: Cardiovascular;  Laterality: N/A;   ESOPHAGOGASTRODUODENOSCOPY (EGD) WITH PROPOFOL N/A 10/09/2016   Dr. Jena Gauss: LA grade a esophagitis.  Barrett's esophagus, biopsy-proven.  Small hiatal hernia.  EGD February 2019   ESOPHAGOGASTRODUODENOSCOPY (EGD) WITH PROPOFOL N/A 11/28/2019    salmon-colored esophageal mucosa (Barrett's) small hiatal hernia, portal hypertensive gastropathy, normal duodenum, 3 year surveillance   ESOPHAGOGASTRODUODENOSCOPY (EGD) WITH PROPOFOL N/A 09/06/2021   three columns of grade 1 varices in distal esophagus, no stigmata of bleeding or red wale signs. Small hiatal hernia. Mild portal gastropathy. Normal duodenum. Repeat upper endoscopy in 1 year for surveillance.    ESOPHAGOGASTRODUODENOSCOPY (EGD) WITH PROPOFOL N/A 06/15/2023   Procedure: ESOPHAGOGASTRODUODENOSCOPY (EGD) WITH PROPOFOL;  Surgeon: Corbin Ade, MD;  Location: [facility];  Service: Endoscopy;  Laterality: N/A;  830am, asa 3   FIDUCIAL MARKER PLACEMENT  10/16/2022   Procedure: FIDUCIAL MARKER PLACEMENT;  Surgeon: Omar Person, MD;  Location: [facility] ENDOSCOPY;  Service: Pulmonary;;   INCISION / DRAINAGE HAND / FINGER     LEFT HEART CATH AND CORONARY ANGIOGRAPHY N/A 04/27/2017   Procedure: LEFT HEART CATH AND CORONARY ANGIOGRAPHY;  Surgeon: Yvonne Kendall, MD;  Location: [facility] INVASIVE CV LAB;  Service: Cardiovascular;  Laterality: N/A;   LOOP RECORDER INSERTION  06/29/2017   Procedure: Loop Recorder Insertion;  Surgeon: Marinus Maw, MD;  Location: [facility] INVASIVE CV LAB;  Service: Cardiovascular;;   POLYPECTOMY  11/24/2016   Procedure: POLYPECTOMY;  Surgeon: Corbin Ade, MD;  Location: [facility];  Service: Endoscopy;;  descending and sigmoid   POLYPECTOMY  10/18/2021   Procedure: POLYPECTOMY;  Surgeon: Lanelle Bal, DO;  Location: [facility];  Service: Endoscopy;;   TOTAL HIP ARTHROPLASTY Right 07/01/2016   TOTAL HIP ARTHROPLASTY Right 07/01/2016   Procedure: RIGHT TOTAL HIP ARTHROPLASTY ANTERIOR APPROACH;  Surgeon: Kathryne Hitch, MD;  [facility] OR;  Service: Orthopedics;  Laterality: Right;     Allergies  Allergen Reactions   Carvedilol Other (See Comments)    Sinus pause on telemetry >3 seconds. Longest one 9 sec. No AV nodal agent   Lisinopril  Anaphylaxis, Shortness Of Breath and Swelling    Angioedema, required intubation and mechanical ventilation   Chantix [Varenicline] Other (See Comments)    Nightmares and unable to sleep well   Amoxicillin Nausea Only    Did it involve swelling of the face/tongue/throat, SOB, or low BP? No Did it involve sudden or severe rash/hives, skin peeling, or any reaction on the inside of your mouth or nose? No Did you  need to seek medical attention at a hospital or doctor's office? No When did it last happen? childhood reaction      If all above answers are "NO", may proceed with cephalosporin use.       Family History  Problem Relation Age of Onset   Heart attack Father    Stroke Father    Arthritis Father    Heart disease Father    Cancer Mother        ???   Arthritis Mother    Heart disease Brother 76       died in sleep   Early death Brother    Diabetes Maternal Uncle    Alzheimer's disease Maternal Grandmother      Social History Mr. Sangiovanni reports that he has been smoking cigarettes. He has a 46 pack-year smoking history. He has never been exposed to tobacco smoke. He has quit using smokeless tobacco.  His smokeless tobacco use included chew. Mr. Lopezgarcia reports current alcohol use.     Physical Examination Today's Vitals   10/14/23 1116  BP: 120/64  Pulse: 84  SpO2: 94%  Weight: 197 lb (89.4 kg)  Height: 5\' 5"  (1.651 m)   Body mass index is 32.78 kg/m.  Gen: resting comfortably, no acute distress HEENT: no scleral icterus, pupils equal round and reactive, no palptable cervical adenopathy,  CV: RRR, no mrg, no jvd Resp: Clear to auscultation bilaterally GI: abdomen is soft, non-tender, non-distended, normal bowel sounds, no hepatosplenomegaly MSK: extremities are warm, no edema.  Skin: warm, no rash Neuro:  no focal deficits Psych: appropriate affect   Diagnostic Studies  01/2015 cath 1st Diag lesion, 100% stenosed. A Promus Premier 2.5 mm x 16 mm drug-eluting stent was placed. There is a 0% residual stenosis post intervention. Ramus lesion, 70% stenosed. Mid RCA lesion, 40% stenosed. Dist RCA lesion, 45% stenosed. Mild to moderately reduced LVEF with anterolateral and apical hypokinesis and elevated LVEDP   Abnormal anatomy with equal sized Diagonal and LAD Randy Hebert but extensive RCA. Successful PCI of the First Diagonal Zahari Xiang.   Recommendations: Standard post  radial cath/PCI TR band removal Dual independent therapy for minimum one year. Check 2-D echocardiogram to better assess EF Add statin and beta blocker. Smoking cessation counseling. Cardiac Rehabilitation And Case Management Consultation If hemodynamic stable, would consider fast-track discharge Would consider noninvasive evaluation of the Ramus Intermedius lesion in the absence of any recurrent anginal pain.   01/2015 echo Study Conclusions  - Left ventricle: Moderately severe hypokinesis of mid   anterolateral segment, apical lateral segment, mid/apical   anterior segments. EF is 40%. The cavity size was normal. Wall   thickness was increased in a pattern of mild LVH. - Aortic valve: Sclerosis without stenosis. There was no   significant regurgitation. - Right ventricle: The cavity size was normal. Systolic function   was normal.   07/2015 CT PE IMPRESSION: 1. Suboptimal contrast bolus timing in the pulmonary arterial tree, poor in the lobar vessels other than the right lower lobe. No central pulmonary embolus. If there is  continued suspicion of acute PE nuclear medicine V/Q scan may be most helpful. 2. Situs abnormality in the chest with right side aortic arch and descending aorta. Mirror image branching. Other visible situs including cardiac and upper abdominal situs appear normal. 3. Calcified Coronary artery atherosclerosis. Hepatomegaly with fatty liver disease.   07/2015 PFTs: mild to mod ventilatory defect with small airway obstruction, moderately reduced DLCO.    07/2018 nuclear stress There was no ST segment deviation noted during stress. Defect 1: There is a medium defect of moderate severity present in the mid anterior, mid anterolateral and apical anterior location. Findings consistent with prior myocardial infarction. This is an intermediate risk study. Nuclear stress EF: 53%.   Assessment and Plan   1. CAD/ICM -no recent symptoms, continue current  meds  2. HFimpEF - euvolemic today, no symptoms.  - continue current meds     3. Hyperlipideima - he stopped atorva on his own thought was causing cramps,  unclear if was or not based on history. Retry atorva 40mg  daily, if issues would try crestor as alternative.    4. HTN - bp is at goal, continue current meds        Antoine Poche, M.D.

## 2023-10-14 NOTE — Telephone Encounter (Signed)
Pt states that he needs refills for Metformin, glipizide, farxiga, and trulicity.

## 2023-10-14 NOTE — Telephone Encounter (Signed)
My notes indicate he was being following by his radiation therapist yearly - if this is no long the case ok to refer to lung cancer screening clinic.

## 2023-10-15 MED ORDER — DAPAGLIFLOZIN PROPANEDIOL 10 MG PO TABS: 10.0000 mg | ORAL_TABLET | Freq: Every day | ORAL | 0 refills | Status: DC

## 2023-10-15 MED ORDER — GLIPIZIDE ER 5 MG PO TB24: 5.0000 mg | ORAL_TABLET | Freq: Every day | ORAL | 0 refills | Status: DC

## 2023-10-15 MED ORDER — METFORMIN HCL 500 MG PO TABS: 500.0000 mg | ORAL_TABLET | Freq: Two times a day (BID) | ORAL | 0 refills | Status: DC

## 2023-10-15 NOTE — Telephone Encounter (Signed)
Rx refills for each sent to pharmacy.

## 2023-10-15 NOTE — Telephone Encounter (Signed)
Called the pt and there was no answer- LMTCB

## 2023-10-16 ENCOUNTER — Encounter: Payer: Self-pay | Admitting: Acute Care

## 2023-10-16 ENCOUNTER — Ambulatory Visit: Payer: 59 | Admitting: Acute Care

## 2023-10-16 VITALS — BP 130/68 | HR 99 | Ht 65.0 in | Wt 197.8 lb

## 2023-10-16 DIAGNOSIS — F17210 Nicotine dependence, cigarettes, uncomplicated: Secondary | ICD-10-CM | POA: Diagnosis not present

## 2023-10-16 DIAGNOSIS — Z85118 Personal history of other malignant neoplasm of bronchus and lung: Secondary | ICD-10-CM | POA: Diagnosis not present

## 2023-10-16 DIAGNOSIS — Z923 Personal history of irradiation: Secondary | ICD-10-CM

## 2023-10-16 DIAGNOSIS — F17200 Nicotine dependence, unspecified, uncomplicated: Secondary | ICD-10-CM

## 2023-10-16 NOTE — Progress Notes (Signed)
History of Present Illness Randy Hebert is a 58 y.o.  male with history of COPD, CAD, OSA, alcoholic/nonalcoholic cirrhosis, ?difficult intubation due to vocal cord injury referred for pet avid pulmonary nodule RUL , eventually diagnosed with Stage IA1, cT1aN0M0, NSCLC, squamous cell carcinoma of the RUL.. Initially followed by Dr. Thora Hebert and currently by  Dr. Sherene Hebert.   Other PMH T2DM, CAD, STEMI (2016), NSTEMI (2018), CHF, hyperlipidemia, COPD, liver cirrhosis   10/16/2023 Pt. Presents for follow up after diagnosis of Stage IA1, cT1aN0M0, NSCLC, squamous cell carcinoma of the RUL. Diagnosed 10/16/2022.The tumor in the RUL was treated with a course of stereotactic body radiation treatment  11/14/22-11/21/22  by [facility] radiation oncology. The patient received 54 Gy In 3 fractions at 18 G per fraction. He tolerated treatment well. Plan was for a CT Chest 6-8 weeks after his last appointment with radiation oncology 10/2022, and then again 6 months after.CT chest was done 04/2023, which showed that the  Irregular 1.0 x 0.6 cm anterior apical right upper lobe solid pulmonary nodule adjacent to fiducial marker, was minimally decreased in size since 10/13/2022 CT. There were no  findings of metastatic disease in the chest, which is reassuring. He is due for repeat imaging which I will order. He will also need follow up with radiation oncology.  I have reached out to Randy Mulling, RN, Nurse Navigator oncology, who will reach out to the patient Monday and get him scheduled for follow up. Currently smoking a PPD. He has been counseled to quit. See AVS.   Test Results: CT Chest 04/19/2024 Irregular 1.0 x 0.6 cm anterior apical right upper lobe solid pulmonary nodule adjacent to fiducial marker, minimally decreased in size since 10/13/2022 CT. 2. No findings of metastatic disease in the chest. 3. Three-vessel coronary atherosclerosis. 4. Dilated main pulmonary artery, unchanged, suggesting  chronic pulmonary arterial hypertension. 5. Right-sided aortic arch. 6. Cirrhosis. 7. Mild left colonic diverticulosis. 8.  Aortic Atherosclerosis (ICD10-I70.0).  Cytology 10/16/2022 FINAL MICROSCOPIC DIAGNOSIS:  A. LUNG, RUL, FINE NEEDLE ASPIRATION:  Non-small cell carcinoma  See comment  ADDENDUM: Immunohistochemistry shows the malignant cells are positive  with cytokeratin 5/6 and p40 and negative with TTF-1 consistent with  squamous cell carcinoma.       Latest Ref Rng & Units 09/12/2023    7:25 AM 05/05/2023    8:44 AM 03/24/2023    3:40 PM  CBC  WBC 4.0 - 10.5 K/uL 6.3  7.6  7.5   Hemoglobin 13.0 - 17.0 g/dL 87.5  64.3  32.9   Hematocrit 39.0 - 52.0 % 45.2  46.2  48.6   Platelets 150 - 400 K/uL 160  148  169        Latest Ref Rng & Units 09/12/2023    7:25 AM 05/05/2023    8:44 AM 03/24/2023    3:40 PM  BMP  Glucose 70 - 99 mg/dL 518  841  660   BUN 6 - 20 mg/dL 9  7  11    Creatinine 0.61 - 1.24 mg/dL 6.30  1.60  1.09   BUN/Creat Ratio 9 - 20  10    Sodium 135 - 145 mmol/L 131  138  135   Potassium 3.5 - 5.1 mmol/L 3.7  3.8  3.8   Chloride 98 - 111 mmol/L 98  100  100   CO2 22 - 32 mmol/L 24  22  23    Calcium 8.9 - 10.3 mg/dL 8.9  9.5  9.5  BNP    Component Value Date/Time   BNP 37.0 09/02/2021 2015   BNP 32.6 04/15/2017 0927    ProBNP No results found for: "PROBNP"  PFT    Component Value Date/Time   FEV1PRE 2.19 08/20/2022 1050   FEV1POST 2.18 08/20/2022 1050   FVCPRE 2.92 08/20/2022 1050   FVCPOST 2.70 08/20/2022 1050   TLC 6.33 08/20/2022 1050   DLCOUNC 15.45 08/20/2022 1050   PREFEV1FVCRT 75 08/20/2022 1050   PSTFEV1FVCRT 81 08/20/2022 1050    No results found.   Past medical hx Past Medical History:  Diagnosis Date   ACE inhibitor-aggravated angioedema    Allergy    Anemia    Angio-edema    Anxiety    Arthritis    Asthma    hip replacement   Back pain    Bradycardia 04/28/2017   Bulging of cervical intervertebral disc    CAD  (coronary artery disease)    lateral STEMI 02/06/2015 00% D1 occlusion treated with Promus Premier 2.5 mm x 16 mm DES, 70% ramus stenosis, 40% mid RCA stenosis, 45% distal RCA stenosis, EF 45-50%   CHF (congestive heart failure) (HCC)    COPD (chronic obstructive pulmonary disease) (HCC)    Depression    Diabetes mellitus without complication (HCC)    Difficult intubation    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   Dry eye    Dyspnea    Early satiety 09/23/2016   Fatty liver    GERD (gastroesophageal reflux disease)    HCAP (healthcare-associated pneumonia) 05/15/2017   Headache    Heart murmur    Hip pain    Hyperlipidemia    Hypertension    Lobar pneumonia (HCC) 05/15/2017   Melena 08/04/2018   MI (myocardial infarction) 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   STEMI (ST elevation myocardial infarction) (HCC) 05/31/2015   Substance abuse (HCC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   Syncope 06/29/2017   Syncope and collapse 05/15/2017   Transaminitis 08/04/2018   Unilateral primary osteoarthritis, right hip 07/01/2016     Social History   Tobacco Use   Smoking status: Every Day    Current packs/day: 1.00    Average packs/day: 1 pack/day for 46.0 years (46.0 ttl pk-yrs)    Types: Cigarettes    Passive exposure: Never   Smokeless tobacco: Former    Types: Chew   Tobacco comments:    Smokes 1 pack a day of cigarettes (20 cig)10/16/23  Vaping Use   Vaping status: Former  Substance Use Topics   Alcohol use:  Yes    Comment: rare beer use   Drug use: No    Comment: history of drug use- marijuana, cocaine- quit 2014    [name] reports that he has been smoking cigarettes. He has a 46 pack-year smoking history. He has never been exposed to tobacco smoke. He has quit using smokeless tobacco.  His smokeless tobacco use included chew.  He reports current alcohol use. He reports that he does not use drugs.  Tobacco Cessation: Ready to quit: Yes Counseling given: Yes Tobacco comments: Smokes 1 pack a day of cigarettes (20 cig)10/16/23 Counseled x 3-4 minutes on smoking cessation. Tools provided>> See AMS  Past surgical hx, Family hx, Social hx all reviewed.  Current Outpatient Medications on File Prior to Visit  Medication Sig   acetaminophen (TYLENOL) 500 MG tablet Take 2 tablets (1,000 mg total) by mouth every 8 (eight) hours as needed for mild pain or fever.   albuterol (PROVENTIL) (2.5 MG/3ML) 0.083% nebulizer solution Take 3 mLs (2.5 mg total) by nebulization every 6 (six) hours as needed for wheezing or shortness of breath.   albuterol (VENTOLIN HFA) 108 (90 Base) MCG/ACT inhaler INHALE 2 PUFFS INTO THE LUNGS EVERY 6 HOURS AS NEEDEDFOR SHORTNESS OF BREATH OR WHEEZING.   amLODipine (NORVASC) 10 MG tablet Take 1 tablet (10 mg total) by mouth daily.   ascorbic acid (VITAMIN C) 500 MG tablet Take 1 tablet (500 mg total) by mouth daily.   aspirin EC 81 MG tablet Take 81 mg by mouth 2 (two) times daily. Swallow whole.   atorvastatin (LIPITOR) 40 MG tablet Take 1 tablet (40 mg total) by mouth daily.   azelastine (ASTELIN) 0.1 % nasal spray USE 2 SPRAYS IN EACH NOSTRIL TWICE DAILY.   Blood Glucose Monitoring Suppl (ACCU-CHEK GUIDE ME) w/Device KIT 1 Piece by Does not apply route as directed.   budesonide-formoterol (SYMBICORT) 160-4.5 MCG/ACT inhaler Take 2 puffs first thing in am and then another 2 puffs about 12 hours later.   buPROPion (WELLBUTRIN SR) 150 MG 12 hr tablet Take 1 tablet (150 mg total)  by mouth 2 (two) times daily.   Cholecalciferol (VITAMIN D3) 125 MCG (5000 UT) CAPS Take 1 capsule (5,000 Units total) by mouth daily.   citalopram (CELEXA) 20 MG tablet Take 1 tablet (20 mg total) by mouth daily.   cycloSPORINE (RESTASIS) 0.05 % ophthalmic emulsion Place 1 drop into both eyes 2 (two) times daily.   dapagliflozin propanediol (FARXIGA) 10 MG TABS tablet Take 1 tablet (10 mg total) by mouth daily.   Dulaglutide (TRULICITY) 1.5 MG/0.5ML SOAJ INJECT 1.5MG  INTO THE SKIN ONCE WEEKLY.   ezetimibe (ZETIA) 10 MG tablet Take 1 tablet (10 mg total) by mouth daily.   ferrous sulfate 325 (65 FE) MG tablet Take 1 tablet (325 mg total) by mouth daily with breakfast.   furosemide (LASIX) 40 MG tablet Take 1 tablet (40 mg total) by mouth daily as needed for fluid.   glipiZIDE (GLUCOTROL XL) 5 MG 24 hr tablet Take 1 tablet (5 mg total) by mouth daily with breakfast.   glucose blood (ACCU-CHEK GUIDE TEST) test strip USE AS DIRECTED TO CHECK BLOOD GLUCOSE TWICE DAILY.   lactulose (CHRONULAC) 10 GM/15ML solution Take 15 mLs (10 g total) by mouth daily.   magnesium gluconate (MAGONATE) 500 MG tablet Take 500 mg by mouth 2 (two) times daily.   metFORMIN (GLUCOPHAGE) 500 MG tablet Take 1 tablet (500 mg total) by mouth 2 (two) times daily with a meal.   mometasone (ELOCON) 0.1 % cream Apply 1 application topically daily as needed (eczema on ears).   NEOMYCIN-POLYMYXIN-HYDROCORTISONE (CORTISPORIN) 1 % SOLN OTIC solution INSTILL 3 DROPS INTO AFFECTED EAR(S) 3 TIMES DAILY AS NEEDED.   olopatadine (PATANOL) 0.1 % ophthalmic solution Place 1 drop into both eyes 2 (two) times daily.   pantoprazole (PROTONIX) 40 MG tablet TAKE (1) TABLET BY MOUTH  TWICE DAILY BEFORE A MEAL.   sildenafil (VIAGRA) 25 MG tablet TAKE 1 TABLET BY MOUTH AS NEEDED FOR ERECTILE DYSFUNCTION 1 HOUR PRIOR TO INTERCOURSE.   nitroGLYCERIN (NITROSTAT) 0.4 MG SL tablet Place 1 tablet (0.4 mg total) under the tongue every 5 (five) minutes x 3  doses as needed (if no relief after 3rd dose, proceed to ED or call 911). (Patient not taking: Reported on 10/16/2023)   No current facility-administered medications on file prior to visit.     Allergies  Allergen Reactions   Carvedilol Other (See Comments)    Sinus pause on telemetry >3 seconds. Longest one 9 sec. No AV nodal agent   Lisinopril Anaphylaxis, Shortness Of Breath and Swelling    Angioedema, required intubation and mechanical ventilation   Chantix [Varenicline] Other (See Comments)    Nightmares and unable to sleep well   Amoxicillin Nausea Only    Did it involve swelling of the face/tongue/throat, SOB, or low BP? No Did it involve sudden or severe rash/hives, skin peeling, or any reaction on the inside of your mouth or nose? No Did you need to seek medical attention at a hospital or doctor's office? No When did it last happen? childhood reaction      If all above answers are "NO", may proceed with cephalosporin use.     Review Of Systems:  Constitutional:   No  weight loss, night sweats,  Fevers, chills, fatigue, or  lassitude.  HEENT:   No headaches,  Difficulty swallowing,  Tooth/dental problems, or  Sore throat,                No sneezing, itching, ear ache, nasal congestion, post nasal drip,   CV:  No chest pain,  Orthopnea, PND, swelling in lower extremities, anasarca, dizziness, palpitations, syncope.   GI  No heartburn, indigestion, abdominal pain, nausea, vomiting, diarrhea, change in bowel habits, loss of appetite, bloody stools.   Resp: + shortness of breath with exertion or at rest.  No excess mucus, + baseline  productive cough,  No non-productive cough,  No coughing up of blood.  No change in color of mucus.  No wheezing.  No chest wall deformity  Skin: no rash or lesions.  GU: no dysuria, change in color of urine, no urgency or frequency.  No flank pain, no hematuria   MS:  No joint pain or swelling.  No decreased range of motion.  No back  pain.  Psych:  No change in mood or affect. No depression or anxiety.  No memory loss.   Vital Signs BP 130/68   Pulse 99   Ht 5\' 5"  (1.651 m)   Wt 197 lb 12.8 oz (89.7 kg)   SpO2 96%   BMI 32.92 kg/m    Physical Exam:  General- No distress,  A&Ox3, pleasant ENT: No sinus tenderness, TM clear, pale nasal mucosa, no oral exudate,no post nasal drip, no LAN Cardiac: S1, S2, regular rate and rhythm, no murmur Chest: No wheeze/ rales/ dullness; no accessory muscle use, no nasal flaring, no sternal retractions Abd.: Soft Non-tender, ND, BS +, Body mass index is 32.92 kg/m.  Ext: No clubbing cyanosis, edema, no obvious deformities Neuro:  normal strength, MAE x 4, A&O x 3 Skin: No rashes, warm and dry, no lesions  Psych: normal mood and behavior   Assessment/Plan History of Stage IA1, cT1aN0M0, NSCLC, squamous cell carcinoma of the RUL Diagnosed 10/2022 Treated with stereotactic body radiation treatment  11/14/22-11/21/22  Continues to smoke  1 PPD Plan I have ordered a repeat CT Chest for continued surveillance . You will get a call to get this scheduled.  Follow up with me 1-2 weeks after scan is done  to review the results. You will get a call to get this scheduled.  You can receive free nicotine replacement therapy (patches, gum, or mints) by calling 1-800-QUIT NOW. Please call so we can get you on the path to becoming a non-smoker. I know it is hard, but you can do this!  Hypnosis for smoking cessation  [employer] [phone]  Acupuncture for smoking cessation  [employer] [phone]   I have sent a message to radiation oncology to make sure they schedule you for follow up. Start Wellbutrin as Dr. Adriana Simas ordered to see if this helps. Please contact office for sooner follow up if symptoms do not improve or worsen or seek emergency care     I spent 35 minutes dedicated to the care of this patient on the date of this encounter to include pre-visit  review of records, face-to-face time with the patient discussing conditions above, post visit ordering of testing, clinical documentation with the electronic health record, making appropriate referrals as documented, and communicating necessary information to the patient's healthcare team.   Bevelyn Ngo, NP 10/16/2023  11:16 AM

## 2023-10-16 NOTE — Patient Instructions (Addendum)
It is good to see you today. I have ordered a repeat CT Chest. You will get a call to get this scheduled.  Follow up with me 1-2 weeks after scan is done  to review the results. You will get a call to get this scheduled.  You can receive free nicotine replacement therapy (patches, gum, or mints) by calling 1-800-QUIT NOW. Please call so we can get you on the path to becoming a non-smoker. I know it is hard, but you can do this!  Hypnosis for smoking cessation  [employer] [phone]  Acupuncture for smoking cessation  [employer] [phone]   I have sent a message to radiation oncology to make sure they schedule you for follow up. Start Wellbutrin as Dr. Adriana Simas ordered to see if this helps. Please contact office for sooner follow up if symptoms do not improve or worsen or seek emergency care

## 2023-10-20 NOTE — Telephone Encounter (Signed)
I called and spoke with pt. I notified pt of Dr Thurston Hole note. Pt states he needs the referral for the lung cancer screening. I informed pt I would send the referral. Pt verbalized understanding. NFN  Order was placed and sent to Kandice Robinsons, NP

## 2023-10-23 ENCOUNTER — Ambulatory Visit (HOSPITAL_COMMUNITY)
Admission: RE | Admit: 2023-10-23 | Discharge: 2023-10-23 | Disposition: A | Discharge status: Home / Self Care | Payer: 59 | Source: Ambulatory Visit | Attending: Acute Care | Admitting: Acute Care

## 2023-10-23 DIAGNOSIS — Z85118 Personal history of other malignant neoplasm of bronchus and lung: Secondary | ICD-10-CM | POA: Diagnosis present

## 2023-10-27 ENCOUNTER — Ambulatory Visit (INDEPENDENT_AMBULATORY_CARE_PROVIDER_SITE_OTHER): Payer: 59 | Admitting: Internal Medicine

## 2023-10-27 ENCOUNTER — Encounter: Payer: Self-pay | Admitting: Internal Medicine

## 2023-10-27 ENCOUNTER — Other Ambulatory Visit: Payer: Self-pay | Admitting: *Deleted

## 2023-10-27 VITALS — BP 124/71 | HR 81 | Temp 98.1°F | Ht 65.0 in | Wt 197.2 lb

## 2023-10-27 DIAGNOSIS — K7030 Alcoholic cirrhosis of liver without ascites: Secondary | ICD-10-CM

## 2023-10-27 DIAGNOSIS — K2270 Barrett's esophagus without dysplasia: Secondary | ICD-10-CM

## 2023-10-27 DIAGNOSIS — K824 Cholesterolosis of gallbladder: Secondary | ICD-10-CM

## 2023-10-27 NOTE — Progress Notes (Unsigned)
 Primary Care Physician:  Tommie Sams, DO Primary Gastroenterologist:  Dr. Ane Payment  Pre-Procedure History & Physical: HPI:  Randy Hebert is a 58 y.o. male here for   Past Medical History:  Diagnosis Date   ACE inhibitor-aggravated angioedema    Allergy    Anemia    Angio-edema    Anxiety    Arthritis    Asthma    hip replacement   Back pain    Bradycardia 04/28/2017   Bulging of cervical intervertebral disc    CAD (coronary artery disease)    lateral STEMI 02/06/2015 00% D1 occlusion treated with Promus Premier 2.5 mm x 16 mm DES, 70% ramus stenosis, 40% mid RCA stenosis, 45% distal RCA stenosis, EF 45-50%   CHF (congestive heart failure) (HCC)    COPD (chronic obstructive pulmonary disease) (HCC)    Depression    Diabetes mellitus without complication (HCC)    Difficult intubation    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   Dry eye    Dyspnea    Early satiety 09/23/2016   Fatty liver    GERD (gastroesophageal reflux disease)    HCAP (healthcare-associated pneumonia) 05/15/2017   Headache    Heart murmur    Hip pain    Hyperlipidemia    Hypertension    Lobar pneumonia (HCC) 05/15/2017   Melena 08/04/2018   MI (myocardial infarction) (HCC)    Myocardial infarction (HCC)    Neck pain    Non-ST elevation (NSTEMI) myocardial infarction (HCC) 04/27/2017   NSTEMI (non-ST elevated myocardial infarction) (HCC) 04/26/2017   Otitis media    Pleurisy    Pneumonia due to COVID-19 virus 10/07/2019   Rectal bleeding 11/08/2018   Right shoulder pain 03/20/2020   Sinus pause    9 sec sinus pause on telemetry after started on coreg after MI, avoid AV nodal blocking agent   Sleep apnea    uses a cpap   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   STEMI (ST elevation myocardial infarction) (HCC) 05/31/2015   Substance abuse (HCC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   Syncope 06/29/2017   Syncope and collapse 05/15/2017   Transaminitis 08/04/2018   Unilateral primary  osteoarthritis, right hip 07/01/2016    Past Surgical History:  Procedure Laterality Date   BIOPSY  10/09/2016   Procedure: BIOPSY;  Surgeon: Corbin Ade, MD;  Location: [facility];  Service: Endoscopy;;   BIOPSY  11/28/2019   Procedure: BIOPSY;  Surgeon: Corbin Ade, MD;  Location: [facility];  Service: Endoscopy;;   BIOPSY  06/15/2023   Procedure: BIOPSY;  Surgeon: Corbin Ade, MD;  Location: [facility];  Service: Endoscopy;;   BRONCHIAL BIOPSY  10/16/2022   Procedure: BRONCHIAL BIOPSIES;  Surgeon: Omar Person, MD;  Location: [facility] ENDOSCOPY;  Service: Pulmonary;;   BRONCHIAL NEEDLE ASPIRATION BIOPSY  10/16/2022   Procedure: BRONCHIAL NEEDLE ASPIRATION BIOPSIES;  Surgeon: Omar Person, MD;  Location: [facility] ENDOSCOPY;  Service: Pulmonary;;   BRONCHIAL WASHINGS  10/16/2022   Procedure: BRONCHIAL WASHINGS;  Surgeon: Omar Person, MD;  Location: [facility] ENDOSCOPY;  Service: Pulmonary;;   CARDIAC CATHETERIZATION N/A 02/06/2015   Procedure: Left Heart Cath and Coronary Angiography;  Surgeon: Marykay Lex, MD;  Location: [facility] INVASIVE CV LAB;  Service: Cardiovascular;  Laterality: N/A;   CARDIAC CATHETERIZATION N/A 02/06/2015   Procedure: Coronary Stent Intervention;  Surgeon: Marykay Lex, MD;  Location: [facility] INVASIVE CV LAB;  Service:  Cardiovascular;  Laterality: N/A;   COLONOSCOPY WITH PROPOFOL N/A 10/09/2016   Sigmoid and descending colon diverticulosis, four 4-6 mm polyps in sigmoid, one 4 mm polyp in descending. Tubular adenomas and hyperplastic. 5 year surveillance.    COLONOSCOPY WITH PROPOFOL N/A 11/24/2016   Sigmoid and descending colon diverticulosis, four 4-6 mm polyps in sigmoid, one 4 mm polyp in descending. Tubular adenomas and hyperplastic. 5 year surveillance.    COLONOSCOPY WITH PROPOFOL N/A 10/18/2021   Carver: nonbleeding internal hemorrhoids, small and large mouth diverticula found in the sigmoid, descending, transverse colon.  A 9 mm sessile polyp  was found in the ascending colon that was removed.  4 sessile polyps found in the sigmoid, descending, transverse colon 3 to 5 mm in size, examining otherwise.  Path revealed tubular adenomas.  Repeat due in 5 years for surveillance.   CORONARY ANGIOPLASTY WITH STENT PLACEMENT  01/2015   CORONARY STENT INTERVENTION N/A 04/27/2017   Procedure: CORONARY STENT INTERVENTION;  Surgeon: Yvonne Kendall, MD;  Location: [facility] INVASIVE CV LAB;  Service: Cardiovascular;  Laterality: N/A;   ELECTROPHYSIOLOGY STUDY N/A 06/29/2017   Procedure: ELECTROPHYSIOLOGY STUDY;  Surgeon: Marinus Maw, MD;  Location: [facility] INVASIVE CV LAB;  Service: Cardiovascular;  Laterality: N/A;   ESOPHAGOGASTRODUODENOSCOPY (EGD) WITH PROPOFOL N/A 10/09/2016   Dr. Jena Gauss: LA grade a esophagitis.  Barrett's esophagus, biopsy-proven.  Small hiatal hernia.  EGD February 2019   ESOPHAGOGASTRODUODENOSCOPY (EGD) WITH PROPOFOL N/A 11/28/2019    salmon-colored esophageal mucosa (Barrett's) small hiatal hernia, portal hypertensive gastropathy, normal duodenum, 3 year surveillance   ESOPHAGOGASTRODUODENOSCOPY (EGD) WITH PROPOFOL N/A 09/06/2021   three columns of grade 1 varices in distal esophagus, no stigmata of bleeding or red wale signs. Small hiatal hernia. Mild portal gastropathy. Normal duodenum. Repeat upper endoscopy in 1 year for surveillance.   ESOPHAGOGASTRODUODENOSCOPY (EGD) WITH PROPOFOL N/A 06/15/2023   Procedure: ESOPHAGOGASTRODUODENOSCOPY (EGD) WITH PROPOFOL;  Surgeon: Corbin Ade, MD;  Location: [facility];  Service: Endoscopy;  Laterality: N/A;  830am, asa 3   FIDUCIAL MARKER PLACEMENT  10/16/2022   Procedure: FIDUCIAL MARKER PLACEMENT;  Surgeon: Omar Person, MD;  Location: [facility] ENDOSCOPY;  Service: Pulmonary;;   INCISION / DRAINAGE HAND / FINGER     LEFT HEART CATH AND CORONARY ANGIOGRAPHY N/A 04/27/2017   Procedure: LEFT HEART CATH AND CORONARY ANGIOGRAPHY;  Surgeon: Yvonne Kendall, MD;  Location: [facility] INVASIVE CV  LAB;  Service: Cardiovascular;  Laterality: N/A;   LOOP RECORDER INSERTION  06/29/2017   Procedure: Loop Recorder Insertion;  Surgeon: Marinus Maw, MD;  Location: [facility] INVASIVE CV LAB;  Service: Cardiovascular;;   POLYPECTOMY  11/24/2016   Procedure: POLYPECTOMY;  Surgeon: Corbin Ade, MD;  Location: [facility];  Service: Endoscopy;;  descending and sigmoid   POLYPECTOMY  10/18/2021   Procedure: POLYPECTOMY;  Surgeon: Lanelle Bal, DO;  Location: [facility];  Service: Endoscopy;;   TOTAL HIP ARTHROPLASTY Right 07/01/2016   TOTAL HIP ARTHROPLASTY Right 07/01/2016   Procedure: RIGHT TOTAL HIP ARTHROPLASTY ANTERIOR APPROACH;  Surgeon: Kathryne Hitch, MD;  Location: [facility] OR;  Service: Orthopedics;  Laterality: Right;    Prior to Admission medications   Medication Sig Start Date End Date Taking? Authorizing Provider  acetaminophen (TYLENOL) 500 MG tablet Take 2 tablets (1,000 mg total) by mouth every 8 (eight) hours as needed for mild pain or fever. 09/06/21  Yes Lurene Shadow, MD  albuterol (PROVENTIL) (2.5 MG/3ML) 0.083% nebulizer solution Take 3 mLs (2.5 mg total) by  nebulization every 6 (six) hours as needed for wheezing or shortness of breath. 12/26/22  Yes Cook, Jayce G, DO  albuterol (VENTOLIN HFA) 108 (90 Base) MCG/ACT inhaler INHALE 2 PUFFS INTO THE LUNGS EVERY 6 HOURS AS NEEDEDFOR SHORTNESS OF BREATH OR WHEEZING. 08/08/21  Yes Ambs, Norvel Richards, FNP  amLODipine (NORVASC) 10 MG tablet Take 1 tablet (10 mg total) by mouth daily. 10/14/23  Yes Branch, Dorothe Pea, MD  ascorbic acid (VITAMIN C) 500 MG tablet Take 1 tablet (500 mg total) by mouth daily. 10/12/19  Yes Vassie Loll, MD  aspirin EC 81 MG tablet Take 81 mg by mouth 2 (two) times daily. Swallow whole.   Yes [provider]  atorvastatin (LIPITOR) 40 MG tablet Take 1 tablet (40 mg total) by mouth daily. 10/14/23 01/12/24 Yes Branch, Dorothe Pea, MD  azelastine (ASTELIN) 0.1 % nasal spray USE 2 SPRAYS IN EACH NOSTRIL  TWICE DAILY. 03/14/22  Yes Nyoka Cowden, MD  Blood Glucose Monitoring Suppl (ACCU-CHEK GUIDE ME) w/Device KIT 1 Piece by Does not apply route as directed. 02/20/22  Yes Nida, Denman George, MD  Cholecalciferol (VITAMIN D3) 125 MCG (5000 UT) CAPS Take 1 capsule (5,000 Units total) by mouth daily. 07/19/19  Yes Nida, Denman George, MD  dapagliflozin propanediol (FARXIGA) 10 MG TABS tablet Take 1 tablet (10 mg total) by mouth daily. 10/15/23  Yes Nida, Denman George, MD  Dulaglutide (TRULICITY) 1.5 MG/0.5ML SOAJ INJECT 1.5MG  INTO THE SKIN ONCE WEEKLY. 10/06/23  Yes Nida, Denman George, MD  ezetimibe (ZETIA) 10 MG tablet Take 1 tablet (10 mg total) by mouth daily. 10/14/23  Yes Antoine Poche, MD  ferrous sulfate 325 (65 FE) MG tablet Take 1 tablet (325 mg total) by mouth daily with breakfast. 04/02/21  Yes Heather Roberts, NP  furosemide (LASIX) 40 MG tablet Take 1 tablet (40 mg total) by mouth daily as needed for fluid. 10/14/23  Yes Antoine Poche, MD  glipiZIDE (GLUCOTROL XL) 5 MG 24 hr tablet Take 1 tablet (5 mg total) by mouth daily with breakfast. 10/15/23  Yes Nida, Denman George, MD  glucose blood (ACCU-CHEK GUIDE TEST) test strip USE AS DIRECTED TO CHECK BLOOD GLUCOSE TWICE DAILY. 07/22/23  Yes Nida, Denman George, MD  lactulose (CHRONULAC) 10 GM/15ML solution Take 15 mLs (10 g total) by mouth daily. 07/22/23  Yes Gelene Mink, NP  magnesium gluconate (MAGONATE) 500 MG tablet Take 500 mg by mouth 2 (two) times daily.   Yes [provider]  metFORMIN (GLUCOPHAGE) 500 MG tablet Take 1 tablet (500 mg total) by mouth 2 (two) times daily with a meal. 10/15/23  Yes Nida, Denman George, MD  nitroGLYCERIN (NITROSTAT) 0.4 MG SL tablet Place 1 tablet (0.4 mg total) under the tongue every 5 (five) minutes x 3 doses as needed (if no relief after 3rd dose, proceed to ED or call 911). 05/18/23  Yes Branch, Dorothe Pea, MD  pantoprazole (PROTONIX) 40 MG tablet TAKE (1) TABLET BY MOUTH TWICE  DAILY BEFORE A MEAL. 06/30/22  Yes Gelene Mink, NP    Allergies as of 10/27/2023 - Review Complete 10/27/2023  Allergen Reaction Noted   Carvedilol Other (See Comments) 02/08/2015   Lisinopril Anaphylaxis, Shortness Of Breath, and Swelling 05/30/2015   Chantix [varenicline] Other (See Comments) 07/16/2021   Amoxicillin Nausea Only 02/06/2015    Family History  Problem Relation Age of Onset   Heart attack Father    Stroke Father    Arthritis Father  Heart disease Father    Cancer Mother        ???   Arthritis Mother    Heart disease Brother 53       died in sleep   Early death Brother    Diabetes Maternal Uncle    Alzheimer's disease Maternal Grandmother     Social History   Socioeconomic History   Marital status: Divorced    Spouse name: alice   Number of children: 3   Years of education: 12   Highest education level: Not on file  Occupational History   Occupation: SSI  Tobacco Use   Smoking status: Every Day    Current packs/day: 1.00    Average packs/day: 1 pack/day for 46.0 years (46.0 ttl pk-yrs)    Types: Cigarettes    Passive exposure: Never   Smokeless tobacco: Former    Types: Chew   Tobacco comments:    Smokes 1 pack a day of cigarettes (20 cig)10/16/23  Vaping Use   Vaping status: Former  Substance and Sexual Activity   Alcohol use: Yes    Comment: rare beer use   Drug use: No    Comment: history of drug use- marijuana, cocaine- quit 2014   Sexual activity: Yes    Partners: Female    Birth control/protection: None  Other Topics Concern   Not on file  Social History Narrative   Lives with girlfriend [name]   3 children, but 1 OD  In 2019.   Grandchildren-2      Two cats: may and princess; and a dog-foxy      Enjoy: spending time with pets, TV, yard work       Diet: eats all food groups   Caffeine: half and half coffee, some tea-half and half decaf   Water: 6-8 cups daily      Wears seat belt   Does not use phone while driving    Psychologist, sport and exercise at home      Social Drivers of Health   Financial Resource Strain: Low Risk  (11/14/2022)   Overall Financial Resource Strain (CARDIA)    Difficulty of Paying Living Expenses: Not hard at all  Food Insecurity: No Food Insecurity (09/18/2023)   Hunger Vital Sign    Worried About Running Out of Food in the Last Year: Never true    Ran Out of Food in the Last Year: Never true  Transportation Needs: No Transportation Needs (09/18/2023)   PRAPARE - Administrator, Civil Service (Medical): No    Lack of Transportation (Non-Medical): No  Physical Activity: Inactive (11/14/2022)   Exercise Vital Sign    Days of Exercise per Week: 0 days    Minutes of Exercise per Session: 0 min  Stress: No Stress Concern Present (10/28/2021)   [employer] of Occupational Health - Occupational Stress Questionnaire    Feeling of Stress : Not at all  Social Connections: Moderately Integrated (11/14/2022)   Social Connection and Isolation Panel [NHANES]    Frequency of Communication with Friends and Family: More than three times a week    Frequency of Social Gatherings with Friends and Family: Once a week    Attends Religious Services: More than 4 times per year    Active Member of [employer] or Organizations: Yes    Attends Banker Meetings: More than 4 times per year    Marital Status: Divorced  Intimate Partner Violence: Not At Risk (11/14/2022)   Humiliation, Afraid, Rape, and Kick questionnaire  Fear of Current or Ex-Partner: No    Emotionally Abused: No    Physically Abused: No    Sexually Abused: No    Review of Systems: See HPI, otherwise negative ROS  Physical Exam: BP 124/71 (BP Location: Right Arm, Patient Position: Sitting, Cuff Size: Normal)   Pulse 81   Temp 98.1 F (36.7 C) (Oral)   Ht 5\' 5"  (1.651 m)   Wt 197 lb 3.2 oz (89.4 kg)   SpO2 92%   BMI 32.82 kg/m  General:   Alert,  Well-developed, well-nourished, pleasant and cooperative in  NAD Skin:  Intact without significant lesions or rashes. Eyes:  Sclera clear, no icterus.   Conjunctiva pink. Ears:  Normal auditory acuity. Nose:  No deformity, discharge,  or lesions. Mouth:  No deformity or lesions. Neck:  Supple; no masses or thyromegaly. No significant cervical adenopathy. Lungs:  Clear throughout to auscultation.   No wheezes, crackles, or rhonchi. No acute distress. Heart:  Regular rate and rhythm; no murmurs, clicks, rubs,  or gallops. Abdomen: Non-distended, normal bowel sounds.  Soft and nontender without appreciable mass or hepatosplenomegaly.  Pulses:  Normal pulses noted. Extremities:  Without clubbing or edema.  Impression/Plan:  ***     Notice: This dictation was prepared with Dragon dictation along with smaller phrase technology. Any transcriptional errors that result from this process are unintentional and may not be corrected upon review.

## 2023-10-27 NOTE — Patient Instructions (Addendum)
 Good to see you again today  CMET, INR, CBC, AFP  Time for a repeat liver ultrasound  Stop drinking alcohol  Will check about hepatitis B and a vaccine.  If you have not had it we will arrange.  Plan for a repeat EGD in April 2026  Surveillance colonoscopy-history of colon polyps 2028  Continue Protonix 40 mg orally twice daily  Office visit here in 6 months

## 2023-10-27 NOTE — Progress Notes (Unsigned)
 Primary Care Physician:  Tommie Sams, DO Primary Gastroenterologist:  Dr. Jena Gauss  Pre-Procedure History & Physical: HPI:  Randy Hebert is a 58 y.o. male here for follow-up of EtOH cirrhosis.  Continues to drink [place] mist on a regular basis.  4 DWIs over his lifetime.  Does not remember getting hepatitis A or B vaccine.  He does not drink and drive.  He is overdue for updated labs.  History of small gallbladder polyp and cirrhosis with no focal lesion on ultrasound due for an ultrasound now intolerant to beta-blocker therapy will surveilled with EGD-due April 2026 history of colonic adenoma due for surveillance colonoscopy 2028.  Patient states he is "not an alcoholic".  He can stop drinking for long periods of time if he desires.  Controlled on Protonix twice daily.  Past Medical History:  Diagnosis Date   ACE inhibitor-aggravated angioedema    Allergy    Anemia    Angio-edema    Anxiety    Arthritis    Asthma    hip replacement   Back pain    Bradycardia 04/28/2017   Bulging of cervical intervertebral disc    CAD (coronary artery disease)    lateral STEMI 02/06/2015 00% D1 occlusion treated with Promus Premier 2.5 mm x 16 mm DES, 70% ramus stenosis, 40% mid RCA stenosis, 45% distal RCA stenosis, EF 45-50%   CHF (congestive heart failure) (HCC)    COPD (chronic obstructive pulmonary disease) (HCC)    Depression    Diabetes mellitus without complication (HCC)    Difficult intubation    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   Dry eye    Dyspnea    Early satiety 09/23/2016   Fatty liver    GERD (gastroesophageal reflux disease)    HCAP (healthcare-associated pneumonia) 05/15/2017   Headache    Heart murmur    Hip pain    Hyperlipidemia    Hypertension    Lobar pneumonia (HCC) 05/15/2017   Melena 08/04/2018   MI (myocardial infarction) (HCC)    Myocardial infarction (HCC)    Neck pain    Non-ST elevation (NSTEMI) myocardial infarction (HCC)  04/27/2017   NSTEMI (non-ST elevated myocardial infarction) (HCC) 04/26/2017   Otitis media    Pleurisy    Pneumonia due to COVID-19 virus 10/07/2019   Rectal bleeding 11/08/2018   Right shoulder pain 03/20/2020   Sinus pause    9 sec sinus pause on telemetry after started on coreg after MI, avoid AV nodal blocking agent   Sleep apnea    uses a cpap   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   STEMI (ST elevation myocardial infarction) (HCC) 05/31/2015   Substance abuse (HCC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   Syncope 06/29/2017   Syncope and collapse 05/15/2017   Transaminitis 08/04/2018   Unilateral primary osteoarthritis, right hip 07/01/2016    Past Surgical History:  Procedure Laterality Date   BIOPSY  10/09/2016   Procedure: BIOPSY;  Surgeon: Corbin Ade, MD;  Location: [facility];  Service: Endoscopy;;   BIOPSY  11/28/2019   Procedure: BIOPSY;  Surgeon: Corbin Ade, MD;  Location: [facility];  Service: Endoscopy;;   BIOPSY  06/15/2023   Procedure: BIOPSY;  Surgeon: Corbin Ade, MD;  Location: [facility];  Service: Endoscopy;;   BRONCHIAL BIOPSY  10/16/2022   Procedure: BRONCHIAL BIOPSIES;  Surgeon: Omar Person, MD;  Location: [facility] ENDOSCOPY;  Service: Pulmonary;;   BRONCHIAL  NEEDLE ASPIRATION BIOPSY  10/16/2022   Procedure: BRONCHIAL NEEDLE ASPIRATION BIOPSIES;  Surgeon: Omar Person, MD;  Location: [facility] ENDOSCOPY;  Service: Pulmonary;;   BRONCHIAL WASHINGS  10/16/2022   Procedure: BRONCHIAL WASHINGS;  Surgeon: Omar Person, MD;  Location: [facility] ENDOSCOPY;  Service: Pulmonary;;   CARDIAC CATHETERIZATION N/A 02/06/2015   Procedure: Left Heart Cath and Coronary Angiography;  Surgeon: Marykay Lex, MD;  Location: [facility] INVASIVE CV LAB;  Service: Cardiovascular;  Laterality: N/A;   CARDIAC CATHETERIZATION N/A 02/06/2015   Procedure: Coronary Stent Intervention;  Surgeon: Marykay Lex, MD;  Location: [facility] INVASIVE CV LAB;  Service: Cardiovascular;   Laterality: N/A;   COLONOSCOPY WITH PROPOFOL N/A 10/09/2016   Sigmoid and descending colon diverticulosis, four 4-6 mm polyps in sigmoid, one 4 mm polyp in descending. Tubular adenomas and hyperplastic. 5 year surveillance.    COLONOSCOPY WITH PROPOFOL N/A 11/24/2016   Sigmoid and descending colon diverticulosis, four 4-6 mm polyps in sigmoid, one 4 mm polyp in descending. Tubular adenomas and hyperplastic. 5 year surveillance.    COLONOSCOPY WITH PROPOFOL N/A 10/18/2021   Carver: nonbleeding internal hemorrhoids, small and large mouth diverticula found in the sigmoid, descending, transverse colon.  A 9 mm sessile polyp was found in the ascending colon that was removed.  4 sessile polyps found in the sigmoid, descending, transverse colon 3 to 5 mm in size, examining otherwise.  Path revealed tubular adenomas.  Repeat due in 5 years for surveillance.   CORONARY ANGIOPLASTY WITH STENT PLACEMENT  01/2015   CORONARY STENT INTERVENTION N/A 04/27/2017   Procedure: CORONARY STENT INTERVENTION;  Surgeon: Yvonne Kendall, MD;  Location: [facility] INVASIVE CV LAB;  Service: Cardiovascular;  Laterality: N/A;   ELECTROPHYSIOLOGY STUDY N/A 06/29/2017   Procedure: ELECTROPHYSIOLOGY STUDY;  Surgeon: Marinus Maw, MD;  Location: [facility] INVASIVE CV LAB;  Service: Cardiovascular;  Laterality: N/A;   ESOPHAGOGASTRODUODENOSCOPY (EGD) WITH PROPOFOL N/A 10/09/2016   Dr. Jena Gauss: LA grade a esophagitis.  Barrett's esophagus, biopsy-proven.  Small hiatal hernia.  EGD February 2019   ESOPHAGOGASTRODUODENOSCOPY (EGD) WITH PROPOFOL N/A 11/28/2019    salmon-colored esophageal mucosa (Barrett's) small hiatal hernia, portal hypertensive gastropathy, normal duodenum, 3 year surveillance   ESOPHAGOGASTRODUODENOSCOPY (EGD) WITH PROPOFOL N/A 09/06/2021   three columns of grade 1 varices in distal esophagus, no stigmata of bleeding or red wale signs. Small hiatal hernia. Mild portal gastropathy. Normal duodenum. Repeat upper endoscopy in 1  year for surveillance.   ESOPHAGOGASTRODUODENOSCOPY (EGD) WITH PROPOFOL N/A 06/15/2023   Procedure: ESOPHAGOGASTRODUODENOSCOPY (EGD) WITH PROPOFOL;  Surgeon: Corbin Ade, MD;  [facility]: [facility];  Service: Endoscopy;  Laterality: N/A;  830am, asa 3   FIDUCIAL MARKER PLACEMENT  10/16/2022   Procedure: FIDUCIAL MARKER PLACEMENT;  Surgeon: Omar Person, MD;  Location: [facility] ENDOSCOPY;  Service: Pulmonary;;   INCISION / DRAINAGE HAND / FINGER     LEFT HEART CATH AND CORONARY ANGIOGRAPHY N/A 04/27/2017   Procedure: LEFT HEART CATH AND CORONARY ANGIOGRAPHY;  Surgeon: Yvonne Kendall, MD;  Location: [facility] INVASIVE CV LAB;  Service: Cardiovascular;  Laterality: N/A;   LOOP RECORDER INSERTION  06/29/2017   Procedure: Loop Recorder Insertion;  Surgeon: Marinus Maw, MD;  Location: [facility] INVASIVE CV LAB;  Service: Cardiovascular;;   POLYPECTOMY  11/24/2016   Procedure: POLYPECTOMY;  Surgeon: Corbin Ade, MD;  [facility]: [facility];  Service: Endoscopy;;  descending and sigmoid   POLYPECTOMY  10/18/2021   Procedure: POLYPECTOMY;  Surgeon: Lanelle Bal, DO;  Location: [facility];  Service: Endoscopy;;   TOTAL HIP ARTHROPLASTY Right 07/01/2016   TOTAL HIP ARTHROPLASTY Right 07/01/2016   Procedure: RIGHT TOTAL HIP ARTHROPLASTY ANTERIOR APPROACH;  Surgeon: Kathryne Hitch, MD;  Location: [facility] OR;  Service: Orthopedics;  Laterality: Right;    Prior to Admission medications   Medication Sig Start Date End Date Taking? Authorizing Provider  acetaminophen (TYLENOL) 500 MG tablet Take 2 tablets (1,000 mg total) by mouth every 8 (eight) hours as needed for mild pain or fever. 09/06/21  Yes Lurene Shadow, MD  albuterol (PROVENTIL) (2.5 MG/3ML) 0.083% nebulizer solution Take 3 mLs (2.5 mg total) by nebulization every 6 (six) hours as needed for wheezing or shortness of breath. 12/26/22  Yes Cook, Jayce G, DO  albuterol (VENTOLIN HFA) 108 (90 Base) MCG/ACT inhaler INHALE 2 PUFFS INTO THE LUNGS  EVERY 6 HOURS AS NEEDEDFOR SHORTNESS OF BREATH OR WHEEZING. 08/08/21  Yes Ambs, Norvel Richards, FNP  amLODipine (NORVASC) 10 MG tablet Take 1 tablet (10 mg total) by mouth daily. 10/14/23  Yes Branch, Dorothe Pea, MD  ascorbic acid (VITAMIN C) 500 MG tablet Take 1 tablet (500 mg total) by mouth daily. 10/12/19  Yes Vassie Loll, MD  aspirin EC 81 MG tablet Take 81 mg by mouth 2 (two) times daily. Swallow whole.   Yes [provider]  atorvastatin (LIPITOR) 40 MG tablet Take 1 tablet (40 mg total) by mouth daily. 10/14/23 01/12/24 Yes Branch, Dorothe Pea, MD  azelastine (ASTELIN) 0.1 % nasal spray USE 2 SPRAYS IN EACH NOSTRIL TWICE DAILY. 03/14/22  Yes Nyoka Cowden, MD  Blood Glucose Monitoring Suppl (ACCU-CHEK GUIDE ME) w/Device KIT 1 Piece by Does not apply route as directed. 02/20/22  Yes Nida, Denman George, MD  Cholecalciferol (VITAMIN D3) 125 MCG (5000 UT) CAPS Take 1 capsule (5,000 Units total) by mouth daily. 07/19/19  Yes Nida, Denman George, MD  dapagliflozin propanediol (FARXIGA) 10 MG TABS tablet Take 1 tablet (10 mg total) by mouth daily. 10/15/23  Yes Nida, Denman George, MD  Dulaglutide (TRULICITY) 1.5 MG/0.5ML SOAJ INJECT 1.5MG  INTO THE SKIN ONCE WEEKLY. 10/06/23  Yes Nida, Denman George, MD  ezetimibe (ZETIA) 10 MG tablet Take 1 tablet (10 mg total) by mouth daily. 10/14/23  Yes Antoine Poche, MD  ferrous sulfate 325 (65 FE) MG tablet Take 1 tablet (325 mg total) by mouth daily with breakfast. 04/02/21  Yes Heather Roberts, NP  furosemide (LASIX) 40 MG tablet Take 1 tablet (40 mg total) by mouth daily as needed for fluid. 10/14/23  Yes Antoine Poche, MD  glipiZIDE (GLUCOTROL XL) 5 MG 24 hr tablet Take 1 tablet (5 mg total) by mouth daily with breakfast. 10/15/23  Yes Nida, Denman George, MD  glucose blood (ACCU-CHEK GUIDE TEST) test strip USE AS DIRECTED TO CHECK BLOOD GLUCOSE TWICE DAILY. 07/22/23  Yes Nida, Denman George, MD  lactulose (CHRONULAC) 10 GM/15ML solution Take  15 mLs (10 g total) by mouth daily. 07/22/23  Yes Gelene Mink, NP  magnesium gluconate (MAGONATE) 500 MG tablet Take 500 mg by mouth 2 (two) times daily.   Yes [provider]  metFORMIN (GLUCOPHAGE) 500 MG tablet Take 1 tablet (500 mg total) by mouth 2 (two) times daily with a meal. 10/15/23  Yes Nida, Denman George, MD  nitroGLYCERIN (NITROSTAT) 0.4 MG SL tablet Place 1 tablet (0.4 mg total) under the tongue every 5 (five) minutes x 3 doses as needed (if no relief after 3rd dose, proceed to ED  or call 911). 05/18/23  Yes Branch, Dorothe Pea, MD  pantoprazole (PROTONIX) 40 MG tablet TAKE (1) TABLET BY MOUTH TWICE DAILY BEFORE A MEAL. 06/30/22  Yes Gelene Mink, NP    Allergies as of 10/27/2023 - Review Complete 10/27/2023  Allergen Reaction Noted   Carvedilol Other (See Comments) 02/08/2015   Lisinopril Anaphylaxis, Shortness Of Breath, and Swelling 05/30/2015   Chantix [varenicline] Other (See Comments) 07/16/2021   Amoxicillin Nausea Only 02/06/2015    Family History  Problem Relation Age of Onset   Heart attack Father    Stroke Father    Arthritis Father    Heart disease Father    Cancer Mother        ???   Arthritis Mother    Heart disease Brother 9       died in sleep   Early death Brother    Diabetes Maternal Uncle    Alzheimer's disease Maternal Grandmother     Social History   Socioeconomic History   Marital status: Divorced    Spouse name: alice   Number of children: 3   Years of education: 12   Highest education level: Not on file  Occupational History   Occupation: SSI  Tobacco Use   Smoking status: Every Day    Current packs/day: 1.00    Average packs/day: 1 pack/day for 46.0 years (46.0 ttl pk-yrs)    Types: Cigarettes    Passive exposure: Never   Smokeless tobacco: Former    Types: Chew   Tobacco comments:    Smokes 1 pack a day of cigarettes (20 cig)10/16/23  Vaping Use   Vaping status: Former  Substance and Sexual Activity   Alcohol  use: Yes    Comment: rare beer use   Drug use: No    Comment: history of drug use- marijuana, cocaine- quit 2014   Sexual activity: Yes    Partners: Female    Birth control/protection: None  Other Topics Concern   Not on file  Social History Narrative   Lives with girlfriend [name]   3 children, but 1 OD  In 2019.   Grandchildren-2      Two cats: may and princess; and a dog-foxy      Enjoy: spending time with pets, TV, yard work       Diet: eats all food groups   Caffeine: half and half coffee, some tea-half and half decaf   Water: 6-8 cups daily      Wears seat belt   Does not use phone while driving   Psychologist, sport and exercise at home      Social Drivers of Health   Financial Resource Strain: Low Risk  (11/14/2022)   Overall Financial Resource Strain (CARDIA)    Difficulty of Paying Living Expenses: Not hard at all  Food Insecurity: No Food Insecurity (09/18/2023)   Hunger Vital Sign    Worried About Running Out of Food in the Last Year: Never true    Ran Out of Food in the Last Year: Never true  Transportation Needs: No Transportation Needs (09/18/2023)   PRAPARE - Administrator, Civil Service (Medical): No    Lack of Transportation (Non-Medical): No  Physical Activity: Inactive (11/14/2022)   Exercise Vital Sign    Days of Exercise per Week: 0 days    Minutes of Exercise per Session: 0 min  Stress: No Stress Concern Present (10/28/2021)   [employer] of Occupational Health - Occupational Stress Questionnaire    Feeling  of Stress : Not at all  Social Connections: Moderately Integrated (11/14/2022)   Social Connection and Isolation Panel [NHANES]    Frequency of Communication with Friends and Family: More than three times a week    Frequency of Social Gatherings with Friends and Family: Once a week    Attends Religious Services: More than 4 times per year    Active Member of [employer] or Organizations: Yes    Attends Engineer, structural: More than 4 times  per year    Marital Status: Divorced  Intimate Partner Violence: Not At Risk (11/14/2022)   Humiliation, Afraid, Rape, and Kick questionnaire    Fear of Current or Ex-Partner: No    Emotionally Abused: No    Physically Abused: No    Sexually Abused: No    Review of Systems: See HPI, otherwise negative ROS  Physical Exam: BP 124/71 (BP Location: Right Arm, Patient Position: Sitting, Cuff Size: Normal)   Pulse 81   Temp 98.1 F (36.7 C) (Oral)   Ht 5\' 5"  (1.651 m)   Wt 197 lb 3.2 oz (89.4 kg)   SpO2 92%   BMI 32.82 kg/m  General:   Alert, pleasant conversant disheveled.  Skin:  Intact without significant lesions or rashes. Eyes:  Sclera clear, no icterus.   Conjunctiva pink. Ears:  Normal auditory acuity. Nose:  No deformity, discharge,  or lesions. Mouth:  No deformity or lesions. Neck:  Supple; no masses or thyromegaly. No significant cervical adenopathy. Lungs:  Clear throughout to auscultation.   No wheezes, crackles, or rhonchi. No acute distress. Heart:  Regular rate and rhythm; no murmurs, clicks, rubs,  or gallops. Abdomen: Rotund.  He has diastases recti and a small reducible umbilical hernia.  No shifting fluid wave or shifting dullness.  Negative hepatosplenomegaly  Extremities:  Without clubbing or edema.  Impression/Plan: Compensated EtOH related cirrhosis known grade 1 esophageal varices.  Not a data blocker candidate.  Unsure if he has been vaccinated against hepatitis A and B  History gallbladder polyp surveillance ultrasound due now  Urged him to completely stop drinking.  History of colonic polyp due for surveillance colonoscopy 2028  He should have an EGD around April 2026.  Updated MELD labs  Further recommendations to follow.    Notice: This dictation was prepared with Dragon dictation along with smaller phrase technology. Any transcriptional errors that result from this process are unintentional and may not be corrected upon review.

## 2023-10-29 ENCOUNTER — Ambulatory Visit (INDEPENDENT_AMBULATORY_CARE_PROVIDER_SITE_OTHER): Payer: 59 | Admitting: Family Medicine

## 2023-10-29 ENCOUNTER — Encounter: Payer: Self-pay | Admitting: Family Medicine

## 2023-10-29 VITALS — BP 133/75 | HR 100 | Temp 98.2°F | Ht 65.0 in | Wt 197.0 lb

## 2023-10-29 DIAGNOSIS — F419 Anxiety disorder, unspecified: Secondary | ICD-10-CM

## 2023-10-29 LAB — COMPREHENSIVE METABOLIC PANEL
ALT: 51 [IU]/L — ABNORMAL HIGH (ref 0–44)
AST: 44 [IU]/L — ABNORMAL HIGH (ref 0–40)
Albumin: 4.5 g/dL (ref 3.8–4.9)
Alkaline Phosphatase: 121 [IU]/L (ref 44–121)
BUN/Creatinine Ratio: 18 (ref 9–20)
BUN: 15 mg/dL (ref 6–24)
Bilirubin Total: 0.9 mg/dL (ref 0.0–1.2)
CO2: 19 mmol/L — ABNORMAL LOW (ref 20–29)
Calcium: 9.9 mg/dL (ref 8.7–10.2)
Chloride: 100 mmol/L (ref 96–106)
Creatinine, Ser: 0.82 mg/dL (ref 0.76–1.27)
Globulin, Total: 2.8 g/dL (ref 1.5–4.5)
Glucose: 169 mg/dL — ABNORMAL HIGH (ref 70–99)
Potassium: 4 mmol/L (ref 3.5–5.2)
Sodium: 138 mmol/L (ref 134–144)
Total Protein: 7.3 g/dL (ref 6.0–8.5)
eGFR: 101 mL/min/{1.73_m2} (ref >59)

## 2023-10-29 LAB — CBC WITH DIFFERENTIAL/PLATELET
Basophils Absolute: 0 10*3/uL (ref 0.0–0.2)
Basos: 1 %
EOS (ABSOLUTE): 0.2 10*3/uL (ref 0.0–0.4)
Eos: 3 %
Hematocrit: 50.7 % (ref 37.5–51.0)
Hemoglobin: 17.1 g/dL (ref 13.0–17.7)
Immature Grans (Abs): 0 10*3/uL (ref 0.0–0.1)
Immature Granulocytes: 0 %
Lymphocytes Absolute: 1.6 10*3/uL (ref 0.7–3.1)
Lymphs: 25 %
MCH: 30.8 pg (ref 26.6–33.0)
MCHC: 33.7 g/dL (ref 31.5–35.7)
MCV: 91 fL (ref 79–97)
Monocytes Absolute: 0.5 10*3/uL (ref 0.1–0.9)
Monocytes: 8 %
Neutrophils Absolute: 4.1 10*3/uL (ref 1.4–7.0)
Neutrophils: 63 %
Platelets: 175 10*3/uL (ref 150–450)
RBC: 5.55 x10E6/uL (ref 4.14–5.80)
RDW: 12.8 % (ref 11.6–15.4)
WBC: 6.5 10*3/uL (ref 3.4–10.8)

## 2023-10-29 LAB — PROTIME-INR
INR: 1.1 (ref 0.9–1.2)
Prothrombin Time: 12.1 s — ABNORMAL HIGH (ref 9.1–12.0)

## 2023-10-29 LAB — AFP TUMOR MARKER: AFP, Serum, Tumor Marker: 3.6 ng/mL (ref 0.0–8.4)

## 2023-10-29 MED ORDER — CITALOPRAM HYDROBROMIDE 10 MG PO TABS
ORAL_TABLET | ORAL | 3 refills | Status: DC
Start: 2023-10-29 — End: 2024-03-01

## 2023-10-29 NOTE — Progress Notes (Signed)
 Subjective:  Patient ID: Randy Hebert, male    DOB: July 02, 1966  Age: 58 y.o. MRN: [id number]  CC:   Chief Complaint  Patient presents with   Anxiety    Panic attack feeling when waking up  Follow up requesting refill for prev prescribed med by psych doctor     HPI:  58 year old male with an extensive medical history presents for evaluation of the above.  Patient has a known history of anxiety.  Reports recent worsening.  He states that he feels very anxious and restless internally.  He states that he is unsure why his anxiety has worsened recently.  He states that he was previously on medication which helped significantly.  He states that he was on citalopram.  Will review his prior medications in the ER.  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   Vocal cord dysfunction 05/21/2022   DDD (degenerative disc disease), cervical 05/21/2022   Osteoarthritis of both hands 05/19/2022   History of GI bleed 11/13/2021   Insomnia 09/13/2021   Cirrhosis of liver (HCC) 11/08/2018   Barrett's esophagus 08/04/2018   Vitamin D deficiency 06/30/2018   Alcohol abuse 06/30/2018   DM type 2 causing vascular disease (HCC) 08/17/2017   Chronic combined systolic and diastolic CHF (congestive heart failure) (HCC) 05/15/2017   Syncope and collapse 05/15/2017   OSA (obstructive sleep apnea) 04/28/2017   Depression with anxiety 04/15/2017   GERD (gastroesophageal reflux disease) 09/23/2016   Asthmatic bronchitis , chronic (HCC) 08/27/2015   Mixed hyperlipidemia 08/27/2015   Class 1 obesity due to excess calories with serious comorbidity and body mass index (BMI) of 32.0  to 32.9 in adult 08/27/2015   CAD (coronary artery disease) 05/31/2015   Current smoker 05/31/2015   Essential hypertension, benign 02/09/2015    Social Hx   Social History   Socioeconomic History   Marital status: Divorced    Spouse name: alice   Number of children: 3   Years of education: 12   Highest education level: Not on file  Occupational History   Occupation: SSI  Tobacco Use   Smoking status: Every Day    Current packs/day: 1.00    Average packs/day: 1 pack/day for 46.0 years (46.0 ttl pk-yrs)    Types: Cigarettes    Passive exposure: Never   Smokeless tobacco: Former    Types: Chew   Tobacco comments:    Smokes 1 pack a day of cigarettes (20 cig)10/16/23  Vaping Use   Vaping status: Former  Substance and Sexual Activity   Alcohol use: Yes    Comment: rare beer use   Drug use: No    Comment: history of drug use- marijuana, cocaine- quit 2014   Sexual activity: Yes    Partners: Female    Birth control/protection: None  Other Topics Concern   Not on file  Social History Narrative   Lives with girlfriend [name]   3 children, but 1 OD  In 2019.   Grandchildren-2      Two cats: may and princess; and a dog-foxy      Enjoy: spending time with pets, TV, yard work       Diet:  eats all food groups   Caffeine: half and half coffee, some tea-half and half decaf   Water: 6-8 cups daily      Wears seat belt   Does not use phone while driving   Smoke detectors at home      Social Drivers of Health   Financial Resource Strain: Low Risk  (11/14/2022)   Overall Financial Resource Strain (CARDIA)    Difficulty of Paying Living Expenses: Not hard at all  Food Insecurity: No Food Insecurity (09/18/2023)   Hunger Vital Sign    Worried About Running Out of Food in the Last Year: Never true    Ran Out of Food in the Last Year: Never true  Transportation Needs: No Transportation Needs (09/18/2023)   PRAPARE - Administrator, Civil Service (Medical): No     Lack of Transportation (Non-Medical): No  Physical Activity: Inactive (11/14/2022)   Exercise Vital Sign    Days of Exercise per Week: 0 days    Minutes of Exercise per Session: 0 min  Stress: No Stress Concern Present (10/28/2021)   [employer] of Occupational Health - Occupational Stress Questionnaire    Feeling of Stress : Not at all  Social Connections: Moderately Integrated (11/14/2022)   Social Connection and Isolation Panel [NHANES]    Frequency of Communication with Friends and Family: More than three times a week    Frequency of Social Gatherings with Friends and Family: Once a week    Attends Religious Services: More than 4 times per year    Active Member of [employer] or Organizations: Yes    Attends Engineer, structural: More than 4 times per year    Marital Status: Divorced    Review of Systems Per HPI  Objective:  BP 133/75   Pulse 100   Temp 98.2 F (36.8 C)   Ht 5\' 5"  (1.651 m)   Wt 197 lb (89.4 kg)   SpO2 95%   BMI 32.78 kg/m      10/29/2023    9:54 AM 10/27/2023    9:07 AM 10/27/2023    9:03 AM  BP/Weight  Systolic BP 133 124 155  Diastolic BP 75 71 70  Wt. (Lbs) 197  197.2  BMI 32.78 kg/m2  32.82 kg/m2    Physical Exam Constitutional:      General: He is not in acute distress.    Appearance: He is obese.  HENT:     Head: Normocephalic and atraumatic.  Pulmonary:     Effort: Pulmonary effort is normal. No respiratory distress.  Neurological:     Mental Status: He is alert.  Psychiatric:        Mood and Affect: Mood normal.        Behavior: Behavior normal.     Lab Results  Component Value Date   WBC 6.5 10/28/2023   HGB 17.1 10/28/2023   HCT 50.7 10/28/2023   PLT 175 10/28/2023   GLUCOSE 169 (H) 10/28/2023   CHOL 197 12/25/2022   TRIG 319 (H) 12/25/2022   HDL 40 12/25/2022   LDLCALC 103 (H) 12/25/2022   ALT 51 (H) 10/28/2023   AST 44 (H) 10/28/2023   NA 138 10/28/2023   K 4.0 10/28/2023   CL 100 10/28/2023   CREATININE  0.82 10/28/2023   BUN 15 10/28/2023   CO2 19 (L) 10/28/2023   TSH 3.61 06/21/2020   PSA 0.23 06/21/2020   INR 1.1 10/28/2023   HGBA1C 9.7 (H) 09/12/2023  MICROALBUR 1.8 11/09/2019     Assessment & Plan:  Anxiety Assessment & Plan: Worsening/uncontrolled/exacerbation.  Starting on Celexa.  Orders: -     Citalopram Hydrobromide; 10 mg once daily for 7 days, then increase to 20 mg once daily.  Dispense: 60 tablet; Refill: 3    Follow-up: 3 months  Ovidio Steele Adriana Simas DO [facility]

## 2023-10-29 NOTE — Assessment & Plan Note (Signed)
 Worsening/uncontrolled/exacerbation.  Starting on Celexa.

## 2023-10-29 NOTE — Patient Instructions (Signed)
 Medication sent in.  Follow up in 3 months.  Take care  Dr. Adriana Simas

## 2023-11-04 ENCOUNTER — Ambulatory Visit: Payer: 59 | Admitting: "Endocrinology

## 2023-11-05 ENCOUNTER — Ambulatory Visit (HOSPITAL_COMMUNITY)
Admission: RE | Admit: 2023-11-05 | Discharge: 2023-11-05 | Disposition: A | Discharge status: Home / Self Care | Payer: 59 | Source: Ambulatory Visit | Attending: Internal Medicine | Admitting: Internal Medicine

## 2023-11-05 DIAGNOSIS — K824 Cholesterolosis of gallbladder: Secondary | ICD-10-CM | POA: Diagnosis present

## 2023-11-09 ENCOUNTER — Encounter: Payer: Self-pay | Admitting: Internal Medicine

## 2023-11-10 ENCOUNTER — Telehealth: Payer: Self-pay | Admitting: Radiation Oncology

## 2023-11-10 DIAGNOSIS — C3411 Malignant neoplasm of upper lobe, right bronchus or lung: Secondary | ICD-10-CM

## 2023-11-10 NOTE — Telephone Encounter (Signed)
 I called the patient to review his recent CT results ordered by pulmonary. I left a voicemail to let him know we'd like to get back to following him in surveillance along with pulmonary medicine. I will put in orders for a repeat CT in 6 months of the chest. I asked him to call back if he wanted to review his scan or discuss further.

## 2023-11-20 ENCOUNTER — Ambulatory Visit: Payer: 59 | Admitting: Acute Care

## 2023-11-20 ENCOUNTER — Ambulatory Visit: Payer: 59

## 2023-11-30 ENCOUNTER — Ambulatory Visit (INDEPENDENT_AMBULATORY_CARE_PROVIDER_SITE_OTHER): Admitting: Nurse Practitioner

## 2023-11-30 ENCOUNTER — Encounter: Payer: Self-pay | Admitting: Nurse Practitioner

## 2023-11-30 VITALS — BP 128/79 | HR 87 | Temp 98.1°F | Ht 65.0 in | Wt 191.0 lb

## 2023-11-30 DIAGNOSIS — M5441 Lumbago with sciatica, right side: Secondary | ICD-10-CM | POA: Diagnosis not present

## 2023-11-30 MED ORDER — NAPROXEN 375 MG PO TABS: 375.0000 mg | ORAL_TABLET | Freq: Two times a day (BID) | ORAL | 0 refills | Status: DC

## 2023-11-30 NOTE — Progress Notes (Signed)
   Subjective:    Patient ID: Randy Hebert, male    DOB: 1965-11-16, 58 y.o.   MRN: [id number]  HPI Presents for complaints of pain in the right lateral hip area for the past week.  Seems to be localized to this area.  Worse with certain movements, sitting, getting in and out of his car/truck.  No problem when he lays down at nighttime but notices the pain when he tries to get out of bed.  Walking without difficulty.  Occasional sharp pain.  Had right hip replacement about 6 years ago.  No specific history of injury.  Right before this occurred patient was cleaning out some storage units on his property moving a lot of objects including a few cinderblocks.  Going up and down stairs without difficulty.  No change in bowel or bladder habits.  Review of Systems  Respiratory:  Negative for cough, chest tightness, shortness of breath and wheezing.   Neurological:  Negative for weakness and numbness.         Objective:   Physical Exam NAD.  Alert, oriented.  Lungs clear.  Heart regular rate rhythm.  Tenderness noted with palpation of the right lateral hip area into the right lumbar area.  SLR positive on the right and SLR on the left producing slight pain on the right.  ROM of the right hip is somewhat limited but does not produce any pain.  Gets on and off exam table with minimal difficulty.  When sitting patient shifts his weight to the left side. Today's Vitals   11/30/23 0821  BP: 128/79  Pulse: 87  Temp: 98.1 F (36.7 C)  SpO2: 95%  Weight: 191 lb (86.6 kg)  Height: 5\' 5"  (1.651 m)   Body mass index is 31.78 kg/m.        Assessment & Plan:   Problem List Items Addressed This Visit       Nervous and Auditory   Acute right-sided low back pain with right-sided sciatica - Primary   Relevant Medications   naproxen (NAPROSYN) 375 MG tablet   Meds ordered this encounter  Medications   naproxen (NAPROSYN) 375 MG tablet    Sig: Take 1 tablet (375 mg total) by mouth 2 (two)  times daily with a meal. Prn back pain    Dispense:  30 tablet    Refill:  0    Supervising Provider:   Lilyan Punt A [[id number]]   Take naproxen with food as needed for pain.  Discontinue if any stomach upset or acid reflux symptoms.  This is a temporary measure.  Recommend lidocaine patch and topical analgesics. Ice/heat to the affected area. Stretching exercises. Warning signs reviewed.  Call back in 2 weeks if no improvement, sooner if worse.

## 2023-12-02 ENCOUNTER — Encounter: Payer: Self-pay | Admitting: Gastroenterology

## 2023-12-09 ENCOUNTER — Other Ambulatory Visit: Payer: Self-pay | Admitting: Cardiology

## 2023-12-10 ENCOUNTER — Other Ambulatory Visit: Payer: Self-pay | Admitting: "Endocrinology

## 2023-12-10 DIAGNOSIS — E1159 Type 2 diabetes mellitus with other circulatory complications: Secondary | ICD-10-CM

## 2023-12-17 ENCOUNTER — Other Ambulatory Visit: Payer: Self-pay | Admitting: Family Medicine

## 2024-01-04 ENCOUNTER — Ambulatory Visit (INDEPENDENT_AMBULATORY_CARE_PROVIDER_SITE_OTHER): Admitting: Family Medicine

## 2024-01-04 VITALS — BP 127/75 | HR 85 | Temp 98.2°F | Ht 65.0 in | Wt 194.0 lb

## 2024-01-04 DIAGNOSIS — R350 Frequency of micturition: Secondary | ICD-10-CM | POA: Insufficient documentation

## 2024-01-04 LAB — POCT URINALYSIS DIP (CLINITEK)
Bilirubin, UA: NEGATIVE
Blood, UA: NEGATIVE
Glucose, UA: >=1000 mg/dL — AB
Ketones, POC UA: NEGATIVE mg/dL
Nitrite, UA: POSITIVE — AB
POC PROTEIN,UA: NEGATIVE
Spec Grav, UA: 1.01 (ref 1.010–1.025)
Urobilinogen, UA: 0.2 U/dL
pH, UA: 6 (ref 5.0–8.0)

## 2024-01-04 MED ORDER — CITALOPRAM HYDROBROMIDE 20 MG PO TABS
20.0000 mg | ORAL_TABLET | Freq: Every day | ORAL | 1 refills | Status: DC
Start: 2024-01-04 — End: 2024-07-04

## 2024-01-04 MED ORDER — CEPHALEXIN 500 MG PO CAPS: 500.0000 mg | ORAL_CAPSULE | Freq: Two times a day (BID) | ORAL | 0 refills | Status: DC

## 2024-01-04 NOTE — Progress Notes (Signed)
 Subjective:  Patient ID: Randy Hebert, male    DOB: 1965/11/26  Age: 58 y.o. MRN: [id number]  CC:   Chief Complaint  Patient presents with   Urinary Frequency    In the past week soreness in R groin testicle , urinary frequency, delayed starting urination     HPI:  58 year old male with an extensive past medical history presents for evaluation of the above.  1 to 1.5-week history of urinary frequency, decrease in urinary stream, dysuria.  He also reports he has had some testicular pain.  No documented fever.  No relieving factors.  No other complaints concerns at this time.  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   Vocal cord dysfunction 05/21/2022   DDD (degenerative disc disease), cervical 05/21/2022   Osteoarthritis of both hands 05/19/2022   History of GI bleed 11/13/2021   Insomnia 09/13/2021   Cirrhosis of liver (HCC) 11/08/2018   Barrett's esophagus 08/04/2018   Vitamin D  deficiency 06/30/2018   Alcohol  abuse 06/30/2018   DM type 2 causing vascular disease (HCC) 08/17/2017   Chronic combined systolic and diastolic CHF (congestive heart failure) (HCC) 05/15/2017   Syncope and collapse 05/15/2017   OSA (obstructive sleep apnea) 04/28/2017   Depression with anxiety 04/15/2017   GERD (gastroesophageal reflux disease) 09/23/2016   Asthmatic bronchitis , chronic (HCC) 08/27/2015   Mixed hyperlipidemia 08/27/2015   Class 1 obesity due to excess calories with serious comorbidity and body mass index (BMI) of 32.0 to 32.9 in adult 08/27/2015    CAD (coronary artery disease) 05/31/2015   Current smoker 05/31/2015   Essential hypertension, benign 02/09/2015    Social Hx   Social History   Socioeconomic History   Marital status: Divorced    Spouse name: alice   Number of children: 3   Years of education: 12   Highest education level: Not on file  Occupational History   Occupation: SSI  Tobacco Use   Smoking status: Every Day    Current packs/day: 1.00    Average packs/day: 1 pack/day for 46.0 years (46.0 ttl pk-yrs)    Types: Cigarettes    Passive exposure: Never   Smokeless tobacco: Former    Types: Chew   Tobacco comments:    Smokes 1 pack a day of cigarettes (20 cig)10/16/23  Vaping Use   Vaping status: Former  Substance and Sexual Activity   Alcohol  use: Yes    Comment: rare beer use   Drug use: No    Comment: history of drug use- marijuana, cocaine- quit 2014   Sexual activity: Yes    Partners: Female    Birth control/protection: None  Other Topics Concern   Not on file  Social History Narrative   Lives with girlfriend [name]   3 children, but 1 OD  In 2019.   Grandchildren-2      Two cats: may and princess; and a dog-foxy      Enjoy: spending time with pets, TV, yard work       Diet: eats all food groups   Caffeine: half and half coffee,  some tea-half and half decaf   Water : 6-8 cups daily      Wears seat belt   Does not use phone while driving   Smoke detectors at home      Social Drivers of Health   Financial Resource Strain: Low Risk  (11/14/2022)   Overall Financial Resource Strain (CARDIA)    Difficulty of Paying Living Expenses: Not hard at all  Food Insecurity: No Food Insecurity (09/18/2023)   Hunger Vital Sign    Worried About Running Out of Food in the Last Year: Never true    Ran Out of Food in the Last Year: Never true  Transportation Needs: No Transportation Needs (09/18/2023)   PRAPARE - Administrator, Civil Service (Medical): No    Lack of Transportation  (Non-Medical): No  Physical Activity: Inactive (11/14/2022)   Exercise Vital Sign    Days of Exercise per Week: 0 days    Minutes of Exercise per Session: 0 min  Stress: No Stress Concern Present (10/28/2021)   [employer] of Occupational Health - Occupational Stress Questionnaire    Feeling of Stress : Not at all  Social Connections: Moderately Integrated (11/14/2022)   Social Connection and Isolation Panel [NHANES]    Frequency of Communication with Friends and Family: More than three times a week    Frequency of Social Gatherings with Friends and Family: Once a week    Attends Religious Services: More than 4 times per year    Active Member of [employer] or Organizations: Yes    Attends Engineer, structural: More than 4 times per year    Marital Status: Divorced    Review of Systems Per HPI  Objective:  BP 127/75   Pulse 85   Temp 98.2 F (36.8 C)   Ht 5\' 5"  (1.651 m)   Wt 194 lb (88 kg)   SpO2 94%   BMI 32.28 kg/m      01/04/2024   11:37 AM 11/30/2023    8:21 AM 10/29/2023    9:54 AM  BP/Weight  Systolic BP 127 128 133  Diastolic BP 75 79 75  Wt. (Lbs) 194 191 197  BMI 32.28 kg/m2 31.78 kg/m2 32.78 kg/m2    Physical Exam Vitals and nursing note reviewed.  Constitutional:      General: He is not in acute distress.    Appearance: Normal appearance.  HENT:     Head: Normocephalic and atraumatic.  Cardiovascular:     Rate and Rhythm: Normal rate and regular rhythm.  Abdominal:     General: There is distension.     Tenderness: There is no abdominal tenderness.  Neurological:     Mental Status: He is alert.  Psychiatric:        Mood and Affect: Mood normal.        Behavior: Behavior normal.     Lab Results  Component Value Date   WBC 6.5 10/28/2023   HGB 17.1 10/28/2023   HCT 50.7 10/28/2023   PLT 175 10/28/2023   GLUCOSE 169 (H) 10/28/2023   CHOL 197 12/25/2022   TRIG 319 (H) 12/25/2022   HDL 40 12/25/2022   LDLCALC 103 (H) 12/25/2022   ALT  51 (H) 10/28/2023   AST 44 (H) 10/28/2023   NA 138 10/28/2023   K 4.0 10/28/2023   CL 100 10/28/2023   CREATININE 0.82 10/28/2023   BUN 15 10/28/2023   CO2 19 (L) 10/28/2023   TSH 3.61 06/21/2020   PSA 0.23  06/21/2020   INR 1.1 10/28/2023   HGBA1C 9.7 (H) 09/12/2023   MICROALBUR 1.8 11/09/2019     Assessment & Plan:  Urinary frequency Assessment & Plan: UA consistent with UTI. Sending culture. Starting on Keflex.  Orders: -     POCT URINALYSIS DIP (CLINITEK) -     Urine Culture  Other orders -     Citalopram  Hydrobromide; Take 1 tablet (20 mg total) by mouth daily.  Dispense: 90 tablet; Refill: 1 -     Cephalexin; Take 1 capsule (500 mg total) by mouth 2 (two) times daily.  Dispense: 14 capsule; Refill: 0    Follow-up:  Return if symptoms worsen or fail to improve.  Kathleen Papa DO [facility]

## 2024-01-04 NOTE — Patient Instructions (Signed)
 Antibiotic as prescribed.  If symptoms continue to persist, please let me know.

## 2024-01-04 NOTE — Assessment & Plan Note (Signed)
UA consistent with UTI. Sending culture. Starting on Keflex.  

## 2024-01-06 LAB — URINE CULTURE

## 2024-01-06 LAB — SPECIMEN STATUS REPORT

## 2024-01-08 ENCOUNTER — Other Ambulatory Visit: Payer: Self-pay | Admitting: Gastroenterology

## 2024-01-08 ENCOUNTER — Other Ambulatory Visit: Payer: Self-pay | Admitting: "Endocrinology

## 2024-01-08 ENCOUNTER — Ambulatory Visit (INDEPENDENT_AMBULATORY_CARE_PROVIDER_SITE_OTHER)

## 2024-01-08 VITALS — BP 127/75 | HR 85 | Ht 65.0 in | Wt 194.0 lb

## 2024-01-08 DIAGNOSIS — Z0000 Encounter for general adult medical examination without abnormal findings: Secondary | ICD-10-CM

## 2024-01-08 DIAGNOSIS — E1159 Type 2 diabetes mellitus with other circulatory complications: Secondary | ICD-10-CM

## 2024-01-11 ENCOUNTER — Ambulatory Visit: Payer: 59 | Admitting: "Endocrinology

## 2024-01-11 NOTE — Progress Notes (Signed)
 Subjective:   Randy Hebert is a 58 y.o. who presents for a Medicare Wellness preventive visit.  As a reminder, Annual Wellness Visits don't include a physical exam, and some assessments may be limited, especially if this visit is performed virtually. We may recommend an in-person visit if needed.  Visit Complete: Virtual I connected with  Barbera Books on 01/20/24 by a audio enabled telemedicine application and verified that I am speaking with the correct person using two identifiers.  Patient Location: Home  Provider Location: Home Office  I discussed the limitations of evaluation and management by telemedicine. The patient expressed understanding and agreed to proceed.  Vital Signs: Because this visit was a virtual/telehealth visit, some criteria may be missing or patient reported. Any vitals not documented were not able to be obtained and vitals that have been documented are patient reported.  VideoDeclined- This patient declined Librarian, academic. Therefore the visit was completed with audio only.  Persons Participating in Visit: Patient.  AWV Questionnaire: No: Patient Medicare AWV questionnaire was not completed prior to this visit.  Cardiac Risk Factors include: advanced age (>109men, >80 women);diabetes mellitus;obesity (BMI >30kg/m2);male gender;dyslipidemia;hypertension     Objective:     Today's Vitals   01/08/24 1435  BP: 127/75  Pulse: 85  Weight: 194 lb (88 kg)  Height: 5\' 5"  (1.651 m)   Body mass index is 32.28 kg/m.     01/08/2024    3:00 PM 06/15/2023    6:57 AM 06/12/2023    7:54 AM 06/10/2023   10:08 AM 03/24/2023    3:27 PM 11/14/2022    4:19 PM 10/23/2022    2:42 PM  Advanced Directives  Does Patient Have a Medical Advance Directive? No Yes Yes Yes Yes Yes Yes  Type of Chief of Staff of [place];Living will Healthcare Power of [place];Living will;Out of facility  DNR (pink MOST or yellow form) Living will Healthcare Power of Attorney  Does patient want to make changes to medical advance directive?   No - Patient declined   No - Patient declined No - Patient declined  Copy of Healthcare Power of Attorney in Chart?  No - copy requested No - copy requested    No - copy requested  Would patient like information on creating a medical advance directive?       No - Patient declined    Current Medications (verified) Outpatient Encounter Medications as of 01/08/2024  Medication Sig   acetaminophen  (TYLENOL ) 500 MG tablet Take 2 tablets (1,000 mg total) by mouth every 8 (eight) hours as needed for mild pain or fever.   albuterol  (PROVENTIL ) (2.5 MG/3ML) 0.083% nebulizer solution Take 3 mLs (2.5 mg total) by nebulization every 6 (six) hours as needed for wheezing or shortness of breath.   albuterol  (VENTOLIN  HFA) 108 (90 Base) MCG/ACT inhaler INHALE 2 PUFFS INTO THE LUNGS EVERY 6 HOURS AS NEEDEDFOR SHORTNESS OF BREATH OR WHEEZING.   amLODipine  (NORVASC ) 10 MG tablet Take 1 tablet (10 mg total) by mouth daily.   ascorbic acid  (VITAMIN C) 500 MG tablet Take 1 tablet (500 mg total) by mouth daily.   aspirin  EC 81 MG tablet Take 81 mg by mouth 2 (two) times daily. Swallow whole.   atorvastatin  (LIPITOR ) 40 MG tablet Take 1 tablet (40 mg total) by mouth daily.   azelastine  (ASTELIN ) 0.1 % nasal spray USE 2 SPRAYS IN EACH NOSTRIL TWICE DAILY.   Blood Glucose Monitoring  Suppl (ACCU-CHEK GUIDE ME) w/Device KIT 1 Piece by Does not apply route as directed.   cephALEXin  (KEFLEX ) 500 MG capsule Take 1 capsule (500 mg total) by mouth 2 (two) times daily.   Cholecalciferol (VITAMIN D3) 125 MCG (5000 UT) CAPS Take 1 capsule (5,000 Units total) by mouth daily.   citalopram  (CELEXA ) 20 MG tablet Take 1 tablet (20 mg total) by mouth daily.   dapagliflozin  propanediol (FARXIGA ) 10 MG TABS tablet Take 1 tablet (10 mg total) by mouth daily.   ezetimibe  (ZETIA ) 10 MG tablet Take 1 tablet  (10 mg total) by mouth daily.   ferrous sulfate  325 (65 FE) MG tablet Take 1 tablet (325 mg total) by mouth daily with breakfast.   furosemide  (LASIX ) 40 MG tablet Take 1 tablet (40 mg total) by mouth daily as needed for fluid.   glipiZIDE  (GLUCOTROL  XL) 5 MG 24 hr tablet Take 1 tablet (5 mg total) by mouth daily with breakfast.   glucose blood (ACCU-CHEK GUIDE TEST) test strip USE AS DIRECTED TO CHECK BLOOD GLUCOSE TWICE DAILY.   lactulose  (CHRONULAC ) 10 GM/15ML solution Take 15 mLs (10 g total) by mouth daily.   magnesium  gluconate (MAGONATE) 500 MG tablet Take 500 mg by mouth 2 (two) times daily.   metFORMIN  (GLUCOPHAGE ) 500 MG tablet Take 1 tablet (500 mg total) by mouth 2 (two) times daily with a meal.   naproxen  (NAPROSYN ) 375 MG tablet Take 1 tablet (375 mg total) by mouth 2 (two) times daily with a meal. Prn back pain   nitroGLYCERIN  (NITROSTAT ) 0.4 MG SL tablet Place 1 tablet (0.4 mg total) under the tongue every 5 (five) minutes x 3 doses as needed (if no relief after 3rd dose, proceed to ED or call 911).   SYMBICORT  160-4.5 MCG/ACT inhaler INHALE 2 PUFFS INTO THE LUNGS FIRST THING IN THE MORNING AND 2 PUFFS ABOUT 12 HOURS LATER.   [DISCONTINUED] Dulaglutide  (TRULICITY ) 1.5 MG/0.5ML SOAJ INJECT 1.5MG  INTO THE SKIN ONCE WEEKLY.   [DISCONTINUED] pantoprazole  (PROTONIX ) 40 MG tablet TAKE (1) TABLET BY MOUTH TWICE DAILY BEFORE A MEAL.   citalopram  (CELEXA ) 10 MG tablet 10 mg once daily for 7 days, then increase to 20 mg once daily. (Patient not taking: Reported on 01/08/2024)   No facility-administered encounter medications on file as of 01/08/2024.    Allergies (verified) Carvedilol , Lisinopril , Chantix  [varenicline ], and Amoxicillin    History: Past Medical History:  Diagnosis Date   ACE inhibitor-aggravated angioedema    Allergy     Anemia    Angio-edema    Anxiety    Arthritis    Asthma    hip replacement   Back pain    Bradycardia 04/28/2017   Bulging of cervical intervertebral  disc    CAD (coronary artery disease)    lateral STEMI 02/06/2015 00% D1 occlusion treated with Promus Premier 2.5 mm x 16 mm DES, 70% ramus stenosis, 40% mid RCA stenosis, 45% distal RCA stenosis, EF 45-50%   CHF (congestive heart failure) (HCC)    COPD (chronic obstructive pulmonary disease) (HCC)    Depression    Diabetes mellitus without complication (HCC)    Difficult intubation    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   Dry eye    Dyspnea    Early satiety 09/23/2016   Fatty liver    GERD (gastroesophageal reflux disease)    HCAP (healthcare-associated pneumonia) 05/15/2017   Headache    Heart murmur    Hip pain    Hyperlipidemia  Hypertension    Lobar pneumonia (HCC) 05/15/2017   Melena 08/04/2018   MI (myocardial infarction) [facility])    Myocardial infarction [facility])    Neck pain    Non-ST elevation (NSTEMI) myocardial infarction (HCC) 04/27/2017   NSTEMI (non-ST elevated myocardial infarction) (HCC) 04/26/2017   Otitis media    Pleurisy    Pneumonia due to COVID-19 virus 10/07/2019   Rectal bleeding 11/08/2018   Right shoulder pain 03/20/2020   Sinus pause    9 sec sinus pause on telemetry after started on coreg  after MI, avoid AV nodal blocking agent   Sleep apnea    uses a cpap   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   STEMI (ST elevation myocardial infarction) (HCC) 05/31/2015   Substance abuse (HCC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   Syncope 06/29/2017   Syncope and collapse 05/15/2017   Transaminitis 08/04/2018   Unilateral primary osteoarthritis, right hip 07/01/2016   Past Surgical History:  Procedure Laterality Date   BIOPSY  10/09/2016   Procedure: BIOPSY;  Surgeon: Suzette Espy, MD;  [facility];  Service: Endoscopy;;   BIOPSY  11/28/2019   Procedure: BIOPSY;  Surgeon: Suzette Espy, MD;  [facility];  Service: Endoscopy;;   BIOPSY  06/15/2023   Procedure: BIOPSY;  Surgeon: Suzette Espy, MD;  [facility];  Service: Endoscopy;;   BRONCHIAL BIOPSY  10/16/2022   Procedure: BRONCHIAL BIOPSIES;  Surgeon: Gloriajean Large, MD;  Location: [facility] ENDOSCOPY;  Service: Pulmonary;;   BRONCHIAL NEEDLE ASPIRATION BIOPSY  10/16/2022   Procedure: BRONCHIAL NEEDLE ASPIRATION BIOPSIES;  Surgeon: Gloriajean Large, MD;  Location: [facility] ENDOSCOPY;  Service: Pulmonary;;   BRONCHIAL WASHINGS  10/16/2022   Procedure: BRONCHIAL WASHINGS;  Surgeon: Gloriajean Large, MD;  Location: [facility] ENDOSCOPY;  Service: Pulmonary;;   CARDIAC CATHETERIZATION N/A 02/06/2015   Procedure: Left Heart Cath and Coronary Angiography;  Surgeon: Arleen Lacer, MD;  Location: [facility] INVASIVE CV LAB;  Service: Cardiovascular;  Laterality: N/A;   CARDIAC CATHETERIZATION N/A 02/06/2015   Procedure: Coronary Stent Intervention;  Surgeon: Arleen Lacer, MD;  Location: [facility] INVASIVE CV LAB;  Service: Cardiovascular;  Laterality: N/A;   COLONOSCOPY WITH PROPOFOL  N/A 10/09/2016   Sigmoid and descending colon diverticulosis, four 4-6 mm polyps in sigmoid, one 4 mm polyp in descending. Tubular adenomas and hyperplastic. 5 year surveillance.    COLONOSCOPY WITH PROPOFOL  N/A 11/24/2016   Sigmoid and descending colon diverticulosis, four 4-6 mm polyps in sigmoid, one 4 mm polyp in descending. Tubular adenomas and hyperplastic. 5 year surveillance.    COLONOSCOPY WITH PROPOFOL  N/A 10/18/2021   Carver: nonbleeding internal hemorrhoids, small and large mouth diverticula found in the sigmoid, descending, transverse colon.  A 9 mm sessile polyp was found in the ascending colon that was removed.  4 sessile polyps found in the sigmoid, descending, transverse colon 3 to 5 mm in size, examining otherwise.  Path revealed tubular adenomas.  Repeat due in 5 years for surveillance.   CORONARY ANGIOPLASTY WITH STENT PLACEMENT  01/2015   CORONARY STENT INTERVENTION N/A 04/27/2017   Procedure: CORONARY STENT INTERVENTION;  Surgeon: Sammy Crisp, MD;  Location: [facility] INVASIVE  CV LAB;  Service: Cardiovascular;  Laterality: N/A;   ELECTROPHYSIOLOGY STUDY N/A 06/29/2017   Procedure: ELECTROPHYSIOLOGY STUDY;  Surgeon: Tammie Fall, MD;  Location: [facility] INVASIVE CV LAB;  Service: Cardiovascular;  Laterality: N/A;   ESOPHAGOGASTRODUODENOSCOPY (EGD) WITH PROPOFOL  N/A 10/09/2016   Dr. Riley Cheadle: LA  grade a esophagitis.  Barrett's esophagus, biopsy-proven.  Small hiatal hernia.  EGD February 2019   ESOPHAGOGASTRODUODENOSCOPY (EGD) WITH PROPOFOL  N/A 11/28/2019    salmon-colored esophageal mucosa (Barrett's) small hiatal hernia, portal hypertensive gastropathy, normal duodenum, 3 year surveillance   ESOPHAGOGASTRODUODENOSCOPY (EGD) WITH PROPOFOL  N/A 09/06/2021   three columns of grade 1 varices in distal esophagus, no stigmata of bleeding or red wale signs. Small hiatal hernia. Mild portal gastropathy. Normal duodenum. Repeat upper endoscopy in 1 year for surveillance.   ESOPHAGOGASTRODUODENOSCOPY (EGD) WITH PROPOFOL  N/A 06/15/2023   Procedure: ESOPHAGOGASTRODUODENOSCOPY (EGD) WITH PROPOFOL ;  Surgeon: Suzette Espy, MD;  [facility];  Service: Endoscopy;  Laterality: N/A;  830am, asa 3   FIDUCIAL MARKER PLACEMENT  10/16/2022   Procedure: FIDUCIAL MARKER PLACEMENT;  Surgeon: Gloriajean Large, MD;  Location: [facility] ENDOSCOPY;  Service: Pulmonary;;   INCISION / DRAINAGE HAND / FINGER     LEFT HEART CATH AND CORONARY ANGIOGRAPHY N/A 04/27/2017   Procedure: LEFT HEART CATH AND CORONARY ANGIOGRAPHY;  Surgeon: Sammy Crisp, MD;  Location: [facility] INVASIVE CV LAB;  Service: Cardiovascular;  Laterality: N/A;   LOOP RECORDER INSERTION  06/29/2017   Procedure: Loop Recorder Insertion;  Surgeon: Tammie Fall, MD;  Location: [facility] INVASIVE CV LAB;  Service: Cardiovascular;;   POLYPECTOMY  11/24/2016   Procedure: POLYPECTOMY;  Surgeon: Suzette Espy, MD;  [facility];  Service: Endoscopy;;  descending and sigmoid   POLYPECTOMY  10/18/2021   Procedure: POLYPECTOMY;   Surgeon: Vinetta Greening, DO;  [facility];  Service: Endoscopy;;   TOTAL HIP ARTHROPLASTY Right 07/01/2016   TOTAL HIP ARTHROPLASTY Right 07/01/2016   Procedure: RIGHT TOTAL HIP ARTHROPLASTY ANTERIOR APPROACH;  Surgeon: Arnie Lao, MD;  Location: [facility] OR;  Service: Orthopedics;  Laterality: Right;   Family History  Problem Relation Age of Onset   Heart attack Father    Stroke Father    Arthritis Father    Heart disease Father    Cancer Mother        ???   Arthritis Mother    Heart disease Brother 85       died in sleep   Early death Brother    Diabetes Maternal Uncle    Alzheimer's disease Maternal Grandmother    Social History   Socioeconomic History   Marital status: Divorced    Spouse name: alice   Number of children: 3   Years of education: 12   Highest education level: Not on file  Occupational History   Occupation: SSI  Tobacco Use   Smoking status: Every Day    Current packs/day: 1.00    Average packs/day: 1 pack/day for 46.0 years (46.0 ttl pk-yrs)    Types: Cigarettes    Passive exposure: Never   Smokeless tobacco: Former    Types: Chew   Tobacco comments:    Smokes 1 pack a day of cigarettes (20 cig)10/16/23  Vaping Use   Vaping status: Former  Substance and Sexual Activity   Alcohol  use: Yes    Comment: rare beer use   Drug use: No    Comment: history of drug use- marijuana, cocaine- quit 2014   Sexual activity: Yes    Partners: Female    Birth control/protection: None  Other Topics Concern   Not on file  Social History Narrative   Lives with girlfriend [name]   3 children, but 1 OD  In 2019.   Grandchildren-2      Two  cats: may and princess; and a dog-foxy      Enjoy: spending time with pets, TV, yard work       Diet: eats all food groups   Caffeine: half and half coffee, some tea-half and half decaf   Water : 6-8 cups daily      Wears seat belt   Does not use phone while driving   Smoke detectors at home       Social Drivers of Health   Financial Resource Strain: Low Risk  (01/08/2024)   Overall Financial Resource Strain (CARDIA)    Difficulty of Paying Living Expenses: Not hard at all  Food Insecurity: No Food Insecurity (01/08/2024)   Hunger Vital Sign    Worried About Running Out of Food in the Last Year: Never true    Ran Out of Food in the Last Year: Never true  Transportation Needs: No Transportation Needs (01/08/2024)   PRAPARE - Administrator, Civil Service (Medical): No    Lack of Transportation (Non-Medical): No  Physical Activity: Insufficiently Active (01/08/2024)   Exercise Vital Sign    Days of Exercise per Week: 2 days    Minutes of Exercise per Session: 60 min  Stress: No Stress Concern Present (01/08/2024)   [employer] of Occupational Health - Occupational Stress Questionnaire    Feeling of Stress : Not at all  Social Connections: Moderately Integrated (11/14/2022)   Social Connection and Isolation Panel [NHANES]    Frequency of Communication with Friends and Family: More than three times a week    Frequency of Social Gatherings with Friends and Family: Once a week    Attends Religious Services: More than 4 times per year    Active Member of [employer] or Organizations: Yes    Attends Engineer, structural: More than 4 times per year    Marital Status: Divorced    Tobacco Counseling Ready to quit: Not Answered Counseling given: Yes Tobacco comments: Smokes 1 pack a day of cigarettes (20 cig)10/16/23    Clinical Intake:  Pre-visit preparation completed: Yes  Pain : No/denies pain     BMI - recorded: 32.28 Nutritional Status: BMI > 30  Obese Nutritional Risks: None Diabetes: Yes CBG done?: No  Lab Results  Component Value Date   HGBA1C 9.7 (H) 09/12/2023   HGBA1C 6.7 (H) 12/25/2022   HGBA1C 6.7 06/23/2022     How often do you need to have someone help you when you read instructions, pamphlets, or other written materials from your doctor  or pharmacy?: 1 - Never  Interpreter Needed?: No  Information entered by :: Alia T/cma   Activities of Daily Living     01/08/2024    2:40 PM 06/12/2023    7:58 AM  In your present state of health, do you have any difficulty performing the following activities:  Hearing? 0   Vision? 0   Difficulty concentrating or making decisions? 0   Walking or climbing stairs? 1   Comment climbing stairs due to asthma/breathing issues/heart condition per pt   Dressing or bathing? 0   Doing errands, shopping? 0 0  Preparing Food and eating ? N   Using the Toilet? N   In the past six months, have you accidently leaked urine? N   Do you have problems with loss of bowel control? N   Managing your Medications? N   Managing your Finances? N   Housekeeping or managing your Housekeeping? N     Patient Care  Team: Cook, Jayce G, DO as PCP - General (Family Medicine) Tammie Fall, MD as PCP - Electrophysiology (Cardiology) Branch, Joyceann No, MD as PCP - Cardiology (Cardiology) Johna Myers, MD as Consulting Physician (Radiation Oncology) Lucendia Rusk, DO (Optometry) [place] Jaynee Meyer, MD as Consulting Physician (Endocrinology) Thelda Finney Lucie Ruts, DO (Inactive) as Consulting Physician (Pulmonary Disease) Vinetta Greening, DO as Consulting Physician (Gastroenterology) Earlie Glen, MD as Referring Physician (Neurosurgery) Jhonny Moss, MD as Consulting Physician (Neurology)  Indicate any recent Medical Services you may have received from other than [facility] providers in the past year (date may be approximate).     Assessment:    This is a routine wellness examination for Sammie Crigler.  Hearing/Vision screen Hearing Screening - Comments:: Pt denies hearing dif Vision Screening - Comments:: pt wears glasses--pt goes Dr. Jennette Moder at [facility] in [place], [place]   Goals Addressed             This Visit's Progress    Quit Smoking   On track    Decrease smoking and plans on quitting        Depression Screen     01/04/2024   11:47 AM 11/30/2023    8:37 AM 10/29/2023   10:00 AM 02/05/2023    9:32 AM 12/25/2022    9:16 AM 11/14/2022    4:18 PM 06/25/2022    2:13 PM  PHQ 2/9 Scores  PHQ - 2 Score 2 2 0 0 0 0 0  PHQ- 9 Score 4 6 2 2  0  0    Fall Risk     01/08/2024    2:43 PM 11/30/2023    8:38 AM 10/29/2023   10:00 AM 06/10/2023   10:08 AM 05/06/2023    4:08 PM  Fall Risk   Falls in the past year? 0 1 0 0 1  Number falls in past yr: 0 0 0 0   Injury with Fall? 0 1 0 0   Risk for fall due to : No Fall Risks History of fall(s) No Fall Risks    Follow up Falls prevention discussed;Falls evaluation completed Falls evaluation completed Falls evaluation completed Falls evaluation completed     MEDICARE RISK AT HOME:  Medicare Risk at Home Any stairs in or around the home?: Yes If so, are there any without handrails?: Yes Home free of loose throw rugs in walkways, pet beds, electrical cords, etc?: Yes Adequate lighting in your home to reduce risk of falls?: Yes Life alert?: No Use of a cane, [place] or w/c?: No (pt use a cane prn due to hip pain) Grab bars in the bathroom?: Yes Shower chair or bench in shower?: No Elevated toilet seat or a handicapped toilet?: No  TIMED UP AND GO:  Was the test performed?  no  Cognitive Function: 6CIT completed        01/08/2024    2:51 PM 11/14/2022    4:19 PM 10/28/2021    9:02 AM 10/24/2020    8:48 AM  6CIT Screen  What Year? 0 points 0 points 0 points 0 points  What month? 0 points 0 points 0 points 0 points  What time? 0 points 0 points 0 points 0 points  Count back from 20 0 points 0 points 0 points 0 points  Months in reverse 0 points 0 points 0 points 0 points  Repeat phrase 0 points 0 points 0 points 0 points  Total Score 0 points 0 points 0 points 0 points  Immunizations Immunization History  Administered Date(s) Administered   Influenza,inj,Quad PF,6+ Mos 07/03/2016, 05/07/2017, 06/20/2020, 07/24/2021, 06/13/2022    Influenza-Unspecified 05/17/2018   Moderna SARS-COV2 Booster Vaccination 03/01/2020   Moderna Sars-Covid-2 Vaccination 02/20/2020   Pneumococcal Conjugate-13 06/17/2017   Pneumococcal Polysaccharide-23 02/07/2015   Tdap 05/17/2019    Screening Tests Health Maintenance  Topic Date Due   Zoster Vaccines- Shingrix (1 of 2) Never done   Pneumococcal Vaccine 69-25 Years old (3 of 3 - PPSV23, PCV20 or PCV21) 02/07/2020   FOOT EXAM  11/22/2022   OPHTHALMOLOGY EXAM  06/27/2023   Diabetic kidney evaluation - Urine ACR  12/25/2023   COVID-19 Vaccine (2 - Moderna risk series) 01/23/2025 (Originally 03/29/2020)   HEMOGLOBIN A1C  03/11/2024   INFLUENZA VACCINE  04/01/2024   Diabetic kidney evaluation - eGFR measurement  10/27/2024   Medicare Annual Wellness (AWV)  01/07/2025   DTaP/Tdap/Td (2 - Td or Tdap) 05/16/2029   Colonoscopy  10/19/2031   Hepatitis C Screening  Completed   HIV Screening  Completed   HPV VACCINES  Aged Out   Meningococcal B Vaccine  Aged Out   Lung Cancer Screening  Discontinued    Health Maintenance  Health Maintenance Due  Topic Date Due   Zoster Vaccines- Shingrix (1 of 2) Never done   Pneumococcal Vaccine 53-80 Years old (3 of 3 - PPSV23, PCV20 or PCV21) 02/07/2020   FOOT EXAM  11/22/2022   OPHTHALMOLOGY EXAM  06/27/2023   Diabetic kidney evaluation - Urine ACR  12/25/2023   Health Maintenance Items Addressed: See Nurse Notes  Additional Screening:  Vision Screening: Recommended annual ophthalmology exams for early detection of glaucoma and other disorders of the eye.  Dental Screening: Recommended annual dental exams for proper oral hygiene  Community Resource Referral / Chronic Care Management: CRR required this visit?  No   CCM required this visit?  No   Plan:    I have personally reviewed and noted the following in the patient's chart:   Medical and social history Use of alcohol , tobacco or illicit drugs  Current medications and  supplements including opioid prescriptions. Patient is not currently taking opioid prescriptions. Functional ability and status Nutritional status Physical activity Advanced directives List of other physicians Hospitalizations, surgeries, and ER visits in previous 12 months Vitals Screenings to include cognitive, depression, and falls Referrals and appointments  In addition, I have reviewed and discussed with patient certain preventive protocols, quality metrics, and best practice recommendations. A written personalized care plan for preventive services as well as general preventive health recommendations were provided to patient.   Michaelle Adolphus, CMA   01/20/2024   After Visit Summary: (Declined) Due to this being a telephonic visit, with patients personalized plan was offered to patient but patient Declined AVS at this time   Notes: Pt is aware and encourage to get the following done at your next office visit: Foot, Diabetic Eye exam, Shingles, Pneumonia vaccines. Also see routing msg.

## 2024-01-15 ENCOUNTER — Other Ambulatory Visit: Payer: Self-pay | Admitting: Family Medicine

## 2024-01-15 ENCOUNTER — Encounter

## 2024-01-15 ENCOUNTER — Other Ambulatory Visit: Payer: Self-pay | Admitting: "Endocrinology

## 2024-01-15 DIAGNOSIS — E1159 Type 2 diabetes mellitus with other circulatory complications: Secondary | ICD-10-CM

## 2024-01-20 NOTE — Patient Instructions (Addendum)
 Randy Hebert , Thank you for taking time out of your busy schedule to complete your Annual Wellness Visit with me. I enjoyed our conversation and look forward to speaking with you again next year. I, as well as your care team,  appreciate your ongoing commitment to your health goals. Please review the following plan we discussed and let me know if I can assist you in the future. Your Game plan/ To Do List   Follow up Visits: Next Medicare AWV with our clinical staff: 01/13/25 at 10:40a.m.   Next Office Visit with your provider: n/a  Clinician Recommendations:  Aim for 30 minutes of exercise or brisk walking, 6-8 glasses of water , and 5 servings of fruits and vegetables each day. Please remember to get the following done at your next office visit: Foot, Diabetic Eye exam, Shingles, pneumonia vaccines.       This is a list of the screening recommended for you and due dates:  Health Maintenance  Topic Date Due   Zoster (Shingles) Vaccine (1 of 2) Never done   Pneumococcal Vaccination (3 of 3 - PPSV23, PCV20 or PCV21) 02/07/2020   Complete foot exam   11/22/2022   Eye exam for diabetics  06/27/2023   Yearly kidney health urinalysis for diabetes  12/25/2023   COVID-19 Vaccine (2 - Moderna risk series) 01/23/2025*   Hemoglobin A1C  03/11/2024   Flu Shot  04/01/2024   Yearly kidney function blood test for diabetes  10/27/2024   Medicare Annual Wellness Visit  01/07/2025   DTaP/Tdap/Td vaccine (2 - Td or Tdap) 05/16/2029   Colon Cancer Screening  10/19/2031   Hepatitis C Screening  Completed   HIV Screening  Completed   HPV Vaccine  Aged Out   Meningitis B Vaccine  Aged Out   Screening for Lung Cancer  Discontinued  *Topic was postponed. The date shown is not the original due date.    Advanced directives: (Declined) Advance directive discussed with you today. Even though you declined this today, please call our office should you change your mind, and we can give you the proper paperwork for you  to fill out. Advance Care Planning is important because it:  [x]  Makes sure you receive the medical care that is consistent with your values, goals, and preferences  [x]  It provides guidance to your family and loved ones and reduces their decisional burden about whether or not they are making the right decisions based on your wishes.  Follow the link provided in your after visit summary or read over the paperwork we have mailed to you to help you started getting your Advance Directives in place. If you need assistance in completing these, please reach out to us  so that we can help you!  See attachments for Preventive Care and Fall Prevention Tips.

## 2024-02-02 ENCOUNTER — Ambulatory Visit (INDEPENDENT_AMBULATORY_CARE_PROVIDER_SITE_OTHER): Admitting: "Endocrinology

## 2024-02-02 ENCOUNTER — Ambulatory Visit: Admitting: "Endocrinology

## 2024-02-02 ENCOUNTER — Encounter: Payer: Self-pay | Admitting: "Endocrinology

## 2024-02-02 VITALS — BP 138/64 | HR 92 | Ht 65.0 in | Wt 195.2 lb

## 2024-02-02 DIAGNOSIS — Z6832 Body mass index (BMI) 32.0-32.9, adult: Secondary | ICD-10-CM

## 2024-02-02 DIAGNOSIS — E6609 Other obesity due to excess calories: Secondary | ICD-10-CM

## 2024-02-02 DIAGNOSIS — E1159 Type 2 diabetes mellitus with other circulatory complications: Secondary | ICD-10-CM | POA: Diagnosis not present

## 2024-02-02 DIAGNOSIS — F17210 Nicotine dependence, cigarettes, uncomplicated: Secondary | ICD-10-CM | POA: Diagnosis not present

## 2024-02-02 DIAGNOSIS — E782 Mixed hyperlipidemia: Secondary | ICD-10-CM | POA: Diagnosis not present

## 2024-02-02 DIAGNOSIS — I10 Essential (primary) hypertension: Secondary | ICD-10-CM

## 2024-02-02 DIAGNOSIS — Z7985 Long-term (current) use of injectable non-insulin antidiabetic drugs: Secondary | ICD-10-CM

## 2024-02-02 DIAGNOSIS — F17200 Nicotine dependence, unspecified, uncomplicated: Secondary | ICD-10-CM

## 2024-02-02 DIAGNOSIS — Z7984 Long term (current) use of oral hypoglycemic drugs: Secondary | ICD-10-CM

## 2024-02-02 LAB — POCT GLYCOSYLATED HEMOGLOBIN (HGB A1C): HbA1c, POC (controlled diabetic range): 6.7 % (ref 0.0–7.0)

## 2024-02-02 MED ORDER — ACCU-CHEK GUIDE TEST VI STRP: ORAL_STRIP | 2 refills | Status: DC

## 2024-02-02 NOTE — Progress Notes (Signed)
 02/02/2024, 3:07 PM      Endocrinology follow-up note    Subjective:    Patient ID: Randy Hebert, male    DOB: 12-16-1965.  Randy Hebert is being seen in  follow-up for management of currently uncontrolled symptomatic type 2 diabetes, hyperlipidemia, hypertension. PMD:  Cook, Jayce G, DO.   Past Medical History:  Diagnosis Date   ACE inhibitor-aggravated angioedema    Allergy     Anemia    Angio-edema    Anxiety    Arthritis    Asthma    hip replacement   Back pain    Bradycardia 04/28/2017   Bulging of cervical intervertebral disc    CAD (coronary artery disease)    lateral STEMI 02/06/2015 00% D1 occlusion treated with Promus Premier 2.5 mm x 16 mm DES, 70% ramus stenosis, 40% mid RCA stenosis, 45% distal RCA stenosis, EF 45-50%   CHF (congestive heart failure) (HCC)    COPD (chronic obstructive pulmonary disease) (HCC)    Depression    Diabetes mellitus without complication (HCC)    Difficult intubation    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   Dry eye    Dyspnea    Early satiety 09/23/2016   Fatty liver    GERD (gastroesophageal reflux disease)    HCAP (healthcare-associated pneumonia) 05/15/2017   Headache    Heart murmur    Hip pain    Hyperlipidemia    Hypertension    Lobar pneumonia (HCC) 05/15/2017   Melena 08/04/2018   MI (myocardial infarction) [facility])    Myocardial infarction (HCC)    Neck pain    Non-ST elevation (NSTEMI) myocardial infarction (HCC) 04/27/2017   NSTEMI (non-ST elevated myocardial infarction) (HCC) 04/26/2017   Otitis media    Pleurisy    Pneumonia due to COVID-19 virus 10/07/2019   Rectal bleeding 11/08/2018   Right shoulder pain 03/20/2020   Sinus pause    9 sec sinus pause on telemetry after started on coreg  after MI, avoid AV nodal blocking agent   Sleep apnea    uses a cpap   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   STEMI (ST elevation myocardial infarction)  (HCC) 05/31/2015   Substance abuse (HCC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   Syncope 06/29/2017   Syncope and collapse 05/15/2017   Transaminitis 08/04/2018   Unilateral primary osteoarthritis, right hip 07/01/2016   Past Surgical History:  Procedure Laterality Date   BIOPSY  10/09/2016   Procedure: BIOPSY;  Surgeon: Suzette Espy, MD;  [facility]: [facility];  Service: Endoscopy;;   BIOPSY  11/28/2019   Procedure: BIOPSY;  Surgeon: Suzette Espy, MD;  Location: [facility];  Service: Endoscopy;;   BIOPSY  06/15/2023   Procedure: BIOPSY;  Surgeon: Suzette Espy, MD;  [facility]: [facility];  Service: Endoscopy;;   BRONCHIAL BIOPSY  10/16/2022   Procedure: BRONCHIAL BIOPSIES;  Surgeon: Gloriajean Large, MD;  Location: [facility] ENDOSCOPY;  Service: Pulmonary;;   BRONCHIAL NEEDLE ASPIRATION BIOPSY  10/16/2022   Procedure: BRONCHIAL NEEDLE ASPIRATION BIOPSIES;  Surgeon: Gloriajean Large, MD;  Location: [facility] ENDOSCOPY;  Service: Pulmonary;;   BRONCHIAL WASHINGS  10/16/2022   Procedure: BRONCHIAL WASHINGS;  Surgeon: Gloriajean Large, MD;  Location: [facility] ENDOSCOPY;  Service: Pulmonary;;   CARDIAC CATHETERIZATION N/A 02/06/2015   Procedure: Left Heart Cath and Coronary Angiography;  Surgeon: Arleen Lacer, MD;  Location: [facility] INVASIVE CV LAB;  Service:  Cardiovascular;  Laterality: N/A;   CARDIAC CATHETERIZATION N/A 02/06/2015   Procedure: Coronary Stent Intervention;  Surgeon: Arleen Lacer, MD;  Location: [facility] INVASIVE CV LAB;  Service: Cardiovascular;  Laterality: N/A;   COLONOSCOPY WITH PROPOFOL  N/A 10/09/2016   Sigmoid and descending colon diverticulosis, four 4-6 mm polyps in sigmoid, one 4 mm polyp in descending. Tubular adenomas and hyperplastic. 5 year surveillance.    COLONOSCOPY WITH PROPOFOL  N/A 11/24/2016   Sigmoid and descending colon diverticulosis, four 4-6 mm polyps in sigmoid, one 4 mm polyp in descending. Tubular adenomas and hyperplastic. 5 year surveillance.    COLONOSCOPY WITH PROPOFOL  N/A  10/18/2021   Carver: nonbleeding internal hemorrhoids, small and large mouth diverticula found in the sigmoid, descending, transverse colon.  A 9 mm sessile polyp was found in the ascending colon that was removed.  4 sessile polyps found in the sigmoid, descending, transverse colon 3 to 5 mm in size, examining otherwise.  Path revealed tubular adenomas.  Repeat due in 5 years for surveillance.   CORONARY ANGIOPLASTY WITH STENT PLACEMENT  01/2015   CORONARY STENT INTERVENTION N/A 04/27/2017   Procedure: CORONARY STENT INTERVENTION;  Surgeon: Sammy Crisp, MD;  Location: [facility] INVASIVE CV LAB;  Service: Cardiovascular;  Laterality: N/A;   ELECTROPHYSIOLOGY STUDY N/A 06/29/2017   Procedure: ELECTROPHYSIOLOGY STUDY;  Surgeon: Tammie Fall, MD;  Location: [facility] INVASIVE CV LAB;  Service: Cardiovascular;  Laterality: N/A;   ESOPHAGOGASTRODUODENOSCOPY (EGD) WITH PROPOFOL  N/A 10/09/2016   Dr. Riley Cheadle: LA grade a esophagitis.  Barrett's esophagus, biopsy-proven.  Small hiatal hernia.  EGD February 2019   ESOPHAGOGASTRODUODENOSCOPY (EGD) WITH PROPOFOL  N/A 11/28/2019    salmon-colored esophageal mucosa (Barrett's) small hiatal hernia, portal hypertensive gastropathy, normal duodenum, 3 year surveillance   ESOPHAGOGASTRODUODENOSCOPY (EGD) WITH PROPOFOL  N/A 09/06/2021   three columns of grade 1 varices in distal esophagus, no stigmata of bleeding or red wale signs. Small hiatal hernia. Mild portal gastropathy. Normal duodenum. Repeat upper endoscopy in 1 year for surveillance.   ESOPHAGOGASTRODUODENOSCOPY (EGD) WITH PROPOFOL  N/A 06/15/2023   Procedure: ESOPHAGOGASTRODUODENOSCOPY (EGD) WITH PROPOFOL ;  Surgeon: Suzette Espy, MD;  [facility];  Service: Endoscopy;  Laterality: N/A;  830am, asa 3   FIDUCIAL MARKER PLACEMENT  10/16/2022   Procedure: FIDUCIAL MARKER PLACEMENT;  Surgeon: Gloriajean Large, MD;  Location: [facility] ENDOSCOPY;  Service: Pulmonary;;   INCISION / DRAINAGE HAND / FINGER     LEFT  HEART CATH AND CORONARY ANGIOGRAPHY N/A 04/27/2017   Procedure: LEFT HEART CATH AND CORONARY ANGIOGRAPHY;  Surgeon: Sammy Crisp, MD;  Location: [facility] INVASIVE CV LAB;  Service: Cardiovascular;  Laterality: N/A;   LOOP RECORDER INSERTION  06/29/2017   Procedure: Loop Recorder Insertion;  Surgeon: Tammie Fall, MD;  Location: [facility] INVASIVE CV LAB;  Service: Cardiovascular;;   POLYPECTOMY  11/24/2016   Procedure: POLYPECTOMY;  Surgeon: Suzette Espy, MD;  [facility];  Service: Endoscopy;;  descending and sigmoid   POLYPECTOMY  10/18/2021   Procedure: POLYPECTOMY;  Surgeon: Vinetta Greening, DO;  [facility]: [facility];  Service: Endoscopy;;   TOTAL HIP ARTHROPLASTY Right 07/01/2016   TOTAL HIP ARTHROPLASTY Right 07/01/2016   Procedure: RIGHT TOTAL HIP ARTHROPLASTY ANTERIOR APPROACH;  Surgeon: Arnie Lao, MD;  Location: [facility] OR;  Service: Orthopedics;  Laterality: Right;   Social History   Socioeconomic History   Marital status: Divorced    Spouse name: alice   Number of children: 3   Years of education: 12   Highest  education level: Not on file  Occupational History   Occupation: SSI  Tobacco Use   Smoking status: Every Day    Current packs/day: 1.00    Average packs/day: 1 pack/day for 46.0 years (46.0 ttl pk-yrs)    Types: Cigarettes    Passive exposure: Never   Smokeless tobacco: Former    Types: Chew   Tobacco comments:    Smokes 1 pack a day of cigarettes (20 cig)10/16/23  Vaping Use   Vaping status: Former  Substance and Sexual Activity   Alcohol  use: Yes    Comment: rare beer use   Drug use: No    Comment: history of drug use- marijuana, cocaine- quit 2014   Sexual activity: Yes    Partners: Female    Birth control/protection: None  Other Topics Concern   Not on file  Social History Narrative   Lives with girlfriend [name]   3 children, but 1 OD  In 2019.   Grandchildren-2      Two cats: may and princess; and a dog-foxy      Enjoy:  spending time with pets, TV, yard work       Diet: eats all food groups   Caffeine: half and half coffee, some tea-half and half decaf   Water : 6-8 cups daily      Wears seat belt   Does not use phone while driving   Smoke detectors at home      Social Drivers of Health   Financial Resource Strain: Low Risk  (01/08/2024)   Overall Financial Resource Strain (CARDIA)    Difficulty of Paying Living Expenses: Not hard at all  Food Insecurity: No Food Insecurity (01/08/2024)   Hunger Vital Sign    Worried About Running Out of Food in the Last Year: Never true    Ran Out of Food in the Last Year: Never true  Transportation Needs: No Transportation Needs (01/08/2024)   PRAPARE - Administrator, Civil Service (Medical): No    Lack of Transportation (Non-Medical): No  Physical Activity: Insufficiently Active (01/08/2024)   Exercise Vital Sign    Days of Exercise per Week: 2 days    Minutes of Exercise per Session: 60 min  Stress: No Stress Concern Present (01/08/2024)   [employer] of Occupational Health - Occupational Stress Questionnaire    Feeling of Stress : Not at all  Social Connections: Moderately Integrated (11/14/2022)   Social Connection and Isolation Panel [NHANES]    Frequency of Communication with Friends and Family: More than three times a week    Frequency of Social Gatherings with Friends and Family: Once a week    Attends Religious Services: More than 4 times per year    Active Member of [employer] or Organizations: Yes    Attends Banker Meetings: More than 4 times per year    Marital Status: Divorced   Outpatient Encounter Medications as of 02/02/2024  Medication Sig   acetaminophen  (TYLENOL ) 500 MG tablet Take 2 tablets (1,000 mg total) by mouth every 8 (eight) hours as needed for mild pain or fever.   albuterol  (PROVENTIL ) (2.5 MG/3ML) 0.083% nebulizer solution Take 3 mLs (2.5 mg total) by nebulization every 6 (six) hours as needed for wheezing or  shortness of breath.   albuterol  (VENTOLIN  HFA) 108 (90 Base) MCG/ACT inhaler INHALE 2 PUFFS INTO THE LUNGS EVERY 6 HOURS AS NEEDEDFOR SHORTNESS OF BREATH OR WHEEZING.   amLODipine  (NORVASC ) 10 MG tablet Take 1 tablet (10 mg  total) by mouth daily.   ascorbic acid  (VITAMIN C) 500 MG tablet Take 1 tablet (500 mg total) by mouth daily.   aspirin  EC 81 MG tablet Take 81 mg by mouth 2 (two) times daily. Swallow whole.   atorvastatin  (LIPITOR ) 40 MG tablet Take 1 tablet (40 mg total) by mouth daily.   azelastine  (ASTELIN ) 0.1 % nasal spray USE 2 SPRAYS IN EACH NOSTRIL TWICE DAILY.   Blood Glucose Monitoring Suppl (ACCU-CHEK GUIDE ME) w/Device KIT 1 Piece by Does not apply route as directed.   cephALEXin  (KEFLEX ) 500 MG capsule Take 1 capsule (500 mg total) by mouth 2 (two) times daily.   Cholecalciferol (VITAMIN D3) 125 MCG (5000 UT) CAPS Take 1 capsule (5,000 Units total) by mouth daily.   citalopram  (CELEXA ) 10 MG tablet 10 mg once daily for 7 days, then increase to 20 mg once daily. (Patient not taking: Reported on 01/08/2024)   citalopram  (CELEXA ) 20 MG tablet Take 1 tablet (20 mg total) by mouth daily.   dapagliflozin  propanediol (FARXIGA ) 10 MG TABS tablet Take 1 tablet (10 mg total) by mouth daily.   ezetimibe  (ZETIA ) 10 MG tablet Take 1 tablet (10 mg total) by mouth daily.   ferrous sulfate  325 (65 FE) MG tablet Take 1 tablet (325 mg total) by mouth daily with breakfast.   furosemide  (LASIX ) 40 MG tablet Take 1 tablet (40 mg total) by mouth daily as needed for fluid.   glucose blood (ACCU-CHEK GUIDE TEST) test strip Use to monitor glucose 2 times daily as instructed   lactulose  (CHRONULAC ) 10 GM/15ML solution Take 15 mLs (10 g total) by mouth daily.   magnesium  gluconate (MAGONATE) 500 MG tablet Take 500 mg by mouth 2 (two) times daily.   metFORMIN  (GLUCOPHAGE ) 500 MG tablet Take 1 tablet (500 mg total) by mouth 2 (two) times daily with a meal.   naproxen  (NAPROSYN ) 375 MG tablet Take 1 tablet  (375 mg total) by mouth 2 (two) times daily with a meal. Prn back pain   nitroGLYCERIN  (NITROSTAT ) 0.4 MG SL tablet Place 1 tablet (0.4 mg total) under the tongue every 5 (five) minutes x 3 doses as needed (if no relief after 3rd dose, proceed to ED or call 911).   pantoprazole  (PROTONIX ) 40 MG tablet TAKE (1) TABLET BY MOUTH TWICE DAILY BEFORE A MEAL.   SYMBICORT  160-4.5 MCG/ACT inhaler INHALE 2 PUFFS INTO THE LUNGS FIRST THING IN THE MORNING AND 2 PUFFS ABOUT 12 HOURS LATER.   TRULICITY  1.5 MG/0.5ML SOAJ Inject 1.5 mg into the skin once a week.   [DISCONTINUED] glipiZIDE  (GLUCOTROL  XL) 5 MG 24 hr tablet Take 1 tablet (5 mg total) by mouth daily with breakfast.   [DISCONTINUED] glucose blood (ACCU-CHEK GUIDE TEST) test strip USE AS DIRECTED TO CHECK BLOOD GLUCOSE TWICE DAILY.   No facility-administered encounter medications on file as of 02/02/2024.    ALLERGIES: Allergies  Allergen Reactions   Carvedilol  Other (See Comments)    Sinus pause on telemetry >3 seconds. Longest one 9 sec. No AV nodal agent   Lisinopril  Anaphylaxis, Shortness Of Breath and Swelling    Angioedema, required intubation and mechanical ventilation   Chantix  [Varenicline ] Other (See Comments)    Nightmares and unable to sleep well   Amoxicillin  Nausea Only    Did it involve swelling of the face/tongue/throat, SOB, or low BP? No Did it involve sudden or severe rash/hives, skin peeling, or any reaction on the inside of your mouth or nose? No Did you  need to seek medical attention at a hospital or doctor's office? No When did it last happen? childhood reaction      If all above answers are "NO", may proceed with cephalosporin use.     VACCINATION STATUS: Immunization History  Administered Date(s) Administered   Influenza,inj,Quad PF,6+ Mos 07/03/2016, 05/07/2017, 06/20/2020, 07/24/2021, 06/13/2022   Influenza-Unspecified 05/17/2018   Moderna SARS-COV2 Booster Vaccination 03/01/2020   Moderna Sars-Covid-2  Vaccination 02/20/2020   Pneumococcal Conjugate-13 06/17/2017   Pneumococcal Polysaccharide-23 02/07/2015   Tdap 05/17/2019    Diabetes He presents for his follow-up diabetic visit. He has type 2 diabetes mellitus. Onset time: He was diagnosed at approximate age of 49 years. His disease course has been improving. There are no hypoglycemic associated symptoms. Pertinent negatives for hypoglycemia include no confusion, headaches, pallor or seizures. Pertinent negatives for diabetes include no chest pain, no fatigue, no polydipsia, no polyphagia, no polyuria and no weakness. There are no hypoglycemic complications. Symptoms are improving. Diabetic complications include heart disease and peripheral neuropathy. Risk factors for coronary artery disease include dyslipidemia, diabetes mellitus, hypertension, male sex, family history, obesity, sedentary lifestyle and tobacco exposure. He is compliant with treatment most of the time. His weight is fluctuating minimally. He is following a generally unhealthy diet. When asked about meal planning, he reported none. He has had a previous visit with a dietitian. He never participates in exercise. His home blood glucose trend is decreasing steadily. His breakfast blood glucose range is generally 140-180 mg/dl. His overall blood glucose range is 140-180 mg/dl. (He presents with his meter showing rare monitoring average blood glucose 156 for the last 30 days.  He only has 6 readings.  His point-of-care A1c is 6.7%, improving from 9.7%.  He did not document hypoglycemia.  ) An ACE inhibitor/angiotensin II receptor blocker is contraindicated (He has documented allergy  for ACE inhibitor's). He does not see a podiatrist.Eye exam is not current.  Hyperlipidemia This is a chronic problem. The current episode started more than 1 year ago. Exacerbating diseases include diabetes and obesity. Pertinent negatives include no chest pain, myalgias or shortness of breath. Current  antihyperlipidemic treatment includes statins. Risk factors for coronary artery disease include dyslipidemia, diabetes mellitus, family history, obesity, male sex, hypertension and a sedentary lifestyle.  Hypertension This is a chronic problem. The current episode started more than 1 year ago. Pertinent negatives include no chest pain, headaches, neck pain, palpitations or shortness of breath. Risk factors for coronary artery disease include diabetes mellitus, dyslipidemia, obesity, sedentary lifestyle and smoking/tobacco exposure. Past treatments include calcium  channel blockers. Hypertensive end-organ damage includes CAD/MI.    Review of systems    Objective:    BP 138/64   Pulse 92   Ht 5\' 5"  (1.651 m)   Wt 195 lb 3.2 oz (88.5 kg)   BMI 32.48 kg/m   Wt Readings from Last 3 Encounters:  02/02/24 195 lb 3.2 oz (88.5 kg)  01/08/24 194 lb (88 kg)  01/04/24 194 lb (88 kg)     Physical Exam- Limited   CMP     Component Value Date/Time   NA 138 10/28/2023 0809   K 4.0 10/28/2023 0809   CL 100 10/28/2023 0809   CO2 19 (L) 10/28/2023 0809   GLUCOSE 169 (H) 10/28/2023 0809   GLUCOSE 327 (H) 09/12/2023 0725   BUN 15 10/28/2023 0809   CREATININE 0.82 10/28/2023 0809   CREATININE 0.68 (L) 09/29/2022 0836   CALCIUM  9.9 10/28/2023 0809   PROT 7.3 10/28/2023 0809  ALBUMIN 4.5 10/28/2023 0809   AST 44 (H) 10/28/2023 0809   ALT 51 (H) 10/28/2023 0809   ALKPHOS 121 10/28/2023 0809   BILITOT 0.9 10/28/2023 0809   GFRNONAA >60 09/12/2023 0725   GFRNONAA 108 06/21/2020 0707   GFRAA 127 10/24/2020 0811   GFRAA 125 06/21/2020 0707    Diabetic Labs (most recent): Lab Results  Component Value Date   HGBA1C 6.7 02/02/2024   HGBA1C 9.7 (H) 09/12/2023   HGBA1C 6.7 (H) 12/25/2022   MICROALBUR 1.8 11/09/2019   MICROALBUR 0.7 11/17/2017   MICROALBUR 0.9 04/15/2017     Lipid Panel ( most recent) Lipid Panel     Component Value Date/Time   CHOL 197 12/25/2022 1029   TRIG 319 (H)  12/25/2022 1029   HDL 40 12/25/2022 1029   CHOLHDL 4.9 12/25/2022 1029   CHOLHDL 7.1 (H) 06/21/2020 0707   VLDL 41 (H) 04/27/2017 0505   LDLCALC 103 (H) 12/25/2022 1029   LDLCALC 116 (H) 06/21/2020 0707      Lab Results  Component Value Date   TSH 3.61 06/21/2020   TSH 1.58 11/03/2018   TSH 1.79 11/17/2017   TSH 1.635 04/26/2017   TSH 2.564 06/27/2015   FREET4 1.5 11/03/2018   FREET4 1.4 11/17/2017      Assessment & Plan:   1. DM type 2 causing vascular disease (HCC)  - Randy Hebert has currently uncontrolled symptomatic type 2 DM since  58 years of age.  He presents with his meter showing rare monitoring average blood glucose 156 for the last 30 days.  He only has 6 readings.  His point-of-care A1c is 6.7%, improving from 9.7%.  He did not document hypoglycemia.     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  He also has recent diagnosis of CHF, coronary artery disease which required stent placement,  and Randy Hebert remains at extremely high risk for more acute and chronic complications which include CAD, CVA, CKD, retinopathy, and neuropathy. These are all discussed in detail with the patient.  - I have counseled him on diet management and weight loss, by adopting a carbohydrate restricted/protein rich diet. -Still admits to dietary indiscretions including consumption of sweets and sweetened beverages.    he acknowledges that there is a room for improvement in his food and drink choices. - Suggestion is made for him to avoid simple carbohydrates  from his diet including Cakes, Sweet Desserts, Ice Cream, Soda (diet and regular), Sweet Tea, Candies, Chips, Cookies, Store Bought Juices, Alcohol  in Excess of  1-2 drinks a day, Artificial Sweeteners,  Coffee Creamer, and "Sugar-free" Products, Lemonade. This will help patient to have more stable  blood glucose profile and potentially avoid unintended weight gain.  - I encouraged him to switch to  unprocessed or minimally processed complex starch and increased protein intake (animal or plant source), fruits, and vegetables.  - he is advised to stick to a routine mealtimes to eat 3 meals  a day and avoid unnecessary snacks ( to snack only to correct hypoglycemia).    - I have approached him with the following individualized plan to manage diabetes and patient agrees:   Sammie Crigler presents with significant improvement in his glycemic profile.  He is not committed for regular monitoring.  He is advised to discontinue glipizide  at this time.    - He is advised to continue Trulicity  1.5 mg subcutaneously  weekly, Farxiga  10 mg p.o. daily at breakfast.  Side effects and precautions discussed with him.  He is also advised to continue metformin  500 mg p.o. twice daily with meals.   He is urged to start monitoring blood glucose twice a day-daily before breakfast and at bedtime. -He is encouraged to call clinic for hypoglycemia under 70 or hyperglycemia above 200 mg per DL. The single most important intervention would be smoking cessation and dietary change for him.   The patient was counseled on the dangers of tobacco use, and was advised to quit.  Reviewed strategies to maximize success, including removing cigarettes and smoking materials from environment.   During his last encounter, whole food plant-based diet was discussed with him to address most of his chronic diseases including type 2 diabetes, fatty liver disease, hyperlipidemia, hypertension, obesity.   -Patient specific target  A1c;  LDL, HDL, Triglycerides, and  Waist Circumference were discussed in detail.  2) BP/HTN:  -His blood pressure is controlled to target.  -He remains a chronic heavy smoker. He has a documented allergy  of ACE inhibitors anaphylaxis .  He is advised to continue amlodipine  10 mg p.o. daily, with as needed  diuretics.  He is extensively counseled for smoking cessation.     3) Lipids/HPL: Recent labs show LDL increasing to 103.  He reports that he is more consistent taking his statin at this time.  He is advised to continue atorvastatin  80 mg p.o. nightly.    His history of  fatty liver disease, and cirrhosis of liver likely induced by alcohol , complicates his options of treatment to manage dyslipidemia.     4)  Weight/Diet: His BMI is 32.48/-- abdominal obesity clearly complicating his diabetes care.  He is a candidate for modest weight loss.  CDE Consult has been initiated and in progress,  exercise, and detailed carbohydrates information provided.  5) Chronic Care/Health Maintenance:  -he  is on Statin medications and  is encouraged to continue to follow up with Ophthalmology, Dentist,  Podiatrist at least yearly or according to recommendations, and advised to  Quit smoking ( is a chronic heavy smoker for the last 40 years), and consider weaning off of alcohol .  I have recommended yearly flu vaccine and pneumonia vaccination at least every 5 years; moderate intensity exercise for up to 150 minutes weekly; and  sleep for at least 7 hours a day.   - I advised patient to maintain close follow up with Cook, Jayce G, DO for primary care needs.   I spent  28  minutes in the care of the patient today including review of labs from CMP, Lipids, Thyroid  Function, Hematology (current and previous including abstractions from other facilities); face-to-face time discussing  his blood glucose readings/logs, discussing hypoglycemia and hyperglycemia episodes and symptoms, medications doses, his options of short and long term treatment based on the latest standards of care / guidelines;  discussion about incorporating lifestyle medicine;  and documenting the encounter. Risk reduction counseling performed per USPSTF guidelines to reduce  obesity and cardiovascular risk factors.     Please refer to Patient  Instructions for Blood Glucose Monitoring and Insulin /Medications Dosing Guide"  in media tab for additional information. Please  also refer to " Patient Self Inventory" in the Media  tab for reviewed elements of pertinent patient history.  Randy Hebert participated in the discussions, expressed understanding, and voiced agreement with the above plans.  All questions were answered to his satisfaction. he is encouraged to contact clinic  should he have any questions or concerns prior to his return visit.     Follow up plan: - Return in about 4 months (around 06/03/2024) for F/U with Pre-visit Labs, Meter/CGM/Logs, A1c here.  Kalvin Orf, MD [facility] [address] [place], [place] [address] Phone: [phone]  Fax: [phone]    02/02/2024, 3:07 PM  This note was partially dictated with voice recognition software. Similar sounding words can be transcribed inadequately or may not  be corrected upon review.

## 2024-02-02 NOTE — Patient Instructions (Signed)

## 2024-03-01 ENCOUNTER — Encounter: Payer: Self-pay | Admitting: Family Medicine

## 2024-03-01 ENCOUNTER — Ambulatory Visit (INDEPENDENT_AMBULATORY_CARE_PROVIDER_SITE_OTHER): Admitting: Family Medicine

## 2024-03-01 VITALS — BP 122/75 | HR 88 | Temp 98.1°F | Ht 65.0 in | Wt 196.0 lb

## 2024-03-01 DIAGNOSIS — R531 Weakness: Secondary | ICD-10-CM | POA: Diagnosis not present

## 2024-03-01 NOTE — Patient Instructions (Signed)
 They will call and schedule MRI.  I will call with the results.

## 2024-03-01 NOTE — Assessment & Plan Note (Signed)
 New onset left-sided weakness.  Patient has significant medical comorbidities.  Concern for stroke.  Arranging urgent MRI.

## 2024-03-01 NOTE — Progress Notes (Signed)
 Subjective:  Patient ID: Randy Hebert, male    DOB: October 30, 1965  Age: 58 y.o. MRN: [id number]  CC:   Chief Complaint  Patient presents with   Numbness    Left side numbness,  headache, walking problems, no strength on left side and walking issues with foot. Started Sunday, felt drunk but hadn't had anything to drink,  stumbling around.     HPI:  58 year old male presents for evaluation of the above.  Patient reports that on Sunday night he developed sudden onset dizziness and associated nausea.  He got up to go get some water  and had significant left-sided weakness.  He states that the dizziness and nausea has improved but he continues to have left-sided weakness.  Patient did not seek medical attention.  Denies any pain at this time.  He is having difficulty ambulating.  No speech difficulty.  No acute vision changes.  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   Vocal cord dysfunction 05/21/2022   DDD (degenerative disc disease), cervical 05/21/2022   Osteoarthritis of both hands 05/19/2022   History of GI bleed 11/13/2021   Insomnia 09/13/2021   Cirrhosis of liver (HCC) 11/08/2018   Barrett's esophagus 08/04/2018   Vitamin D  deficiency 06/30/2018   Alcohol  abuse 06/30/2018   DM type 2 causing vascular disease (HCC) 08/17/2017   Chronic combined systolic and diastolic CHF (congestive heart failure) (HCC) 05/15/2017   OSA (obstructive sleep apnea) 04/28/2017   Depression with anxiety 04/15/2017   GERD (gastroesophageal reflux disease) 09/23/2016   Asthmatic bronchitis ,  chronic (HCC) 08/27/2015   Mixed hyperlipidemia 08/27/2015   Class 1 obesity due to excess calories with serious comorbidity and body mass index (BMI) of 32.0 to 32.9 in adult 08/27/2015   CAD (coronary artery disease) 05/31/2015   Essential hypertension, benign 02/09/2015    Social Hx   Social History   Socioeconomic History   Marital status: Divorced    Spouse name: alice   Number of children: 3   Years of education: 12   Highest education level: Not on file  Occupational History   Occupation: [employer]SI  Tobacco Use   Smoking status: Every Day    Current packs/day: 1.00    Average packs/day: 1 pack/day for 46.0 years (46.0 ttl pk-yrs)    Types: Cigarettes    Passive exposure: Never   Smokeless tobacco: Former    Types: Chew   Tobacco comments:    Smokes 1 pack a day of cigarettes (20 cig)10/16/23  Vaping Use   Vaping status: Former  Substance and Sexual Activity   Alcohol  use: Yes    Comment: rare beer use   Drug use: No    Comment: history of drug use- marijuana, cocaine- quit 2014   Sexual activity: Yes    Partners: Female    Birth control/protection: None  Other Topics Concern   Not on file  Social History Narrative   Lives with girlfriend [name]   3 children, but 1 OD  In 2019.   Grandchildren-2      Two cats: may and princess; and a dog-foxy  Enjoy: spending time with pets, TV, yard work       Diet: eats all food groups   Caffeine: half and half coffee, some tea-half and half decaf   Water : 6-8 cups daily      Wears seat belt   Does not use phone while driving   Smoke detectors at home      Social Drivers of Health   Financial Resource Strain: Low Risk  (01/08/2024)   Overall Financial Resource Strain (CARDIA)    Difficulty of Paying Living Expenses: Not hard at all  Food Insecurity: No Food Insecurity (01/08/2024)   Hunger Vital Sign    Worried About Running Out of Food in the Last Year: Never true    Ran Out of Food in the Last Year: Never true   Transportation Needs: No Transportation Needs (01/08/2024)   PRAPARE - Administrator, Civil Service (Medical): No    Lack of Transportation (Non-Medical): No  Physical Activity: Insufficiently Active (01/08/2024)   Exercise Vital Sign    Days of Exercise per Week: 2 days    Minutes of Exercise per Session: 60 min  Stress: No Stress Concern Present (01/08/2024)   [employer] of Occupational Health - Occupational Stress Questionnaire    Feeling of Stress : Not at all  Social Connections: Moderately Integrated (11/14/2022)   Social Connection and Isolation Panel    Frequency of Communication with Friends and Family: More than three times a week    Frequency of Social Gatherings with Friends and Family: Once a week    Attends Religious Services: More than 4 times per year    Active Member of [employer] or Organizations: Yes    Attends Engineer, structural: More than 4 times per year    Marital Status: Divorced    Review of Systems Per HPI  Objective:  BP 122/75   Pulse 88   Temp 98.1 F (36.7 C)   Ht 5' 5 (1.651 m)   Wt 196 lb (88.9 kg)   SpO2 95%   BMI 32.62 kg/m      03/01/2024   10:57 AM 02/02/2024    2:48 PM 01/08/2024    2:35 PM  BP/Weight  Systolic BP 122 138 127  Diastolic BP 75 64 75  Wt. (Lbs) 196 195.2 194  BMI 32.62 kg/m2 32.48 kg/m2 32.28 kg/m2    Physical Exam Vitals and nursing note reviewed.  Constitutional:      General: He is not in acute distress. HENT:     Head: Normocephalic and atraumatic.   Cardiovascular:     Rate and Rhythm: Normal rate and regular rhythm.  Pulmonary:     Effort: Pulmonary effort is normal. No respiratory distress.   Neurological:     Mental Status: He is alert.     Comments: Left-sided weakness particularly of the left lower extremity (proximally).  Psychiatric:        Mood and Affect: Mood normal.        Behavior: Behavior normal.     Lab Results  Component Value Date   WBC 6.5 10/28/2023   HGB  17.1 10/28/2023   HCT 50.7 10/28/2023   PLT 175 10/28/2023   GLUCOSE 169 (H) 10/28/2023   CHOL 197 12/25/2022   TRIG 319 (H) 12/25/2022   HDL 40 12/25/2022   LDLCALC 103 (H) 12/25/2022   ALT 51 (H) 10/28/2023   AST 44 (H) 10/28/2023   NA 138 10/28/2023   K 4.0 10/28/2023  CL 100 10/28/2023   CREATININE 0.82 10/28/2023   BUN 15 10/28/2023   CO2 19 (L) 10/28/2023   TSH 3.61 06/21/2020   PSA 0.23 06/21/2020   INR 1.1 10/28/2023   HGBA1C 6.7 02/02/2024   MICROALBUR 1.8 11/09/2019     Assessment & Plan:  Acute left-sided weakness Assessment & Plan: New onset left-sided weakness.  Patient has significant medical comorbidities.  Concern for stroke.  Arranging urgent MRI.  Orders: -     MR BRAIN WO CONTRAST    Follow-up:  Pending MRI  Jacqulyn Ahle DO [facility]

## 2024-03-02 ENCOUNTER — Encounter (HOSPITAL_COMMUNITY): Payer: Self-pay | Admitting: Emergency Medicine

## 2024-03-02 ENCOUNTER — Other Ambulatory Visit: Payer: Self-pay

## 2024-03-02 ENCOUNTER — Observation Stay (HOSPITAL_COMMUNITY)
Admission: EM | Admit: 2024-03-02 | Discharge: 2024-03-03 | Disposition: A | Discharge status: Home / Self Care | Attending: Internal Medicine | Admitting: Internal Medicine

## 2024-03-02 ENCOUNTER — Observation Stay (HOSPITAL_COMMUNITY)

## 2024-03-02 ENCOUNTER — Ambulatory Visit (HOSPITAL_COMMUNITY)
Admission: RE | Admit: 2024-03-02 | Discharge: 2024-03-02 | Disposition: A | Discharge status: Home / Self Care | Source: Ambulatory Visit | Attending: Family Medicine | Admitting: Family Medicine

## 2024-03-02 DIAGNOSIS — G4733 Obstructive sleep apnea (adult) (pediatric): Secondary | ICD-10-CM | POA: Diagnosis not present

## 2024-03-02 DIAGNOSIS — E785 Hyperlipidemia, unspecified: Secondary | ICD-10-CM | POA: Diagnosis not present

## 2024-03-02 DIAGNOSIS — R531 Weakness: Secondary | ICD-10-CM | POA: Diagnosis present

## 2024-03-02 DIAGNOSIS — Z96641 Presence of right artificial hip joint: Secondary | ICD-10-CM | POA: Diagnosis not present

## 2024-03-02 DIAGNOSIS — F32A Depression, unspecified: Secondary | ICD-10-CM

## 2024-03-02 DIAGNOSIS — Z7984 Long term (current) use of oral hypoglycemic drugs: Secondary | ICD-10-CM | POA: Diagnosis not present

## 2024-03-02 DIAGNOSIS — Z87891 Personal history of nicotine dependence: Secondary | ICD-10-CM | POA: Diagnosis not present

## 2024-03-02 DIAGNOSIS — E876 Hypokalemia: Secondary | ICD-10-CM | POA: Diagnosis not present

## 2024-03-02 DIAGNOSIS — I6389 Other cerebral infarction: Principal | ICD-10-CM | POA: Insufficient documentation

## 2024-03-02 DIAGNOSIS — Z7902 Long term (current) use of antithrombotics/antiplatelets: Secondary | ICD-10-CM | POA: Diagnosis not present

## 2024-03-02 DIAGNOSIS — I639 Cerebral infarction, unspecified: Secondary | ICD-10-CM | POA: Diagnosis not present

## 2024-03-02 DIAGNOSIS — Z8616 Personal history of COVID-19: Secondary | ICD-10-CM | POA: Diagnosis not present

## 2024-03-02 DIAGNOSIS — I5042 Chronic combined systolic (congestive) and diastolic (congestive) heart failure: Secondary | ICD-10-CM | POA: Insufficient documentation

## 2024-03-02 DIAGNOSIS — E119 Type 2 diabetes mellitus without complications: Secondary | ICD-10-CM

## 2024-03-02 DIAGNOSIS — E1151 Type 2 diabetes mellitus with diabetic peripheral angiopathy without gangrene: Secondary | ICD-10-CM | POA: Insufficient documentation

## 2024-03-02 DIAGNOSIS — J4489 Other specified chronic obstructive pulmonary disease: Secondary | ICD-10-CM | POA: Diagnosis not present

## 2024-03-02 DIAGNOSIS — I2510 Atherosclerotic heart disease of native coronary artery without angina pectoris: Secondary | ICD-10-CM | POA: Insufficient documentation

## 2024-03-02 DIAGNOSIS — Z79899 Other long term (current) drug therapy: Secondary | ICD-10-CM | POA: Diagnosis not present

## 2024-03-02 DIAGNOSIS — I10 Essential (primary) hypertension: Secondary | ICD-10-CM | POA: Diagnosis not present

## 2024-03-02 DIAGNOSIS — E1169 Type 2 diabetes mellitus with other specified complication: Secondary | ICD-10-CM

## 2024-03-02 DIAGNOSIS — Z7982 Long term (current) use of aspirin: Secondary | ICD-10-CM | POA: Insufficient documentation

## 2024-03-02 DIAGNOSIS — I110 Hypertensive heart disease with heart failure: Secondary | ICD-10-CM | POA: Insufficient documentation

## 2024-03-02 LAB — DIFFERENTIAL
Abs Immature Granulocytes: 0.04 10*3/uL (ref 0.00–0.07)
Basophils Absolute: 0 10*3/uL (ref 0.0–0.1)
Basophils Relative: 0 %
Eosinophils Absolute: 0.1 10*3/uL (ref 0.0–0.5)
Eosinophils Relative: 1 %
Immature Granulocytes: 0 %
Lymphocytes Relative: 7 %
Lymphs Abs: 0.9 10*3/uL (ref 0.7–4.0)
Monocytes Absolute: 0.3 10*3/uL (ref 0.1–1.0)
Monocytes Relative: 3 %
Neutro Abs: 11.6 10*3/uL — ABNORMAL HIGH (ref 1.7–7.7)
Neutrophils Relative %: 89 %

## 2024-03-02 LAB — COMPREHENSIVE METABOLIC PANEL WITH GFR
ALT: 43 U/L (ref 0–44)
AST: 37 U/L (ref 15–41)
Albumin: 4.1 g/dL (ref 3.5–5.0)
Alkaline Phosphatase: 97 U/L (ref 38–126)
Anion gap: 12 (ref 5–15)
BUN: 10 mg/dL (ref 6–20)
CO2: 23 mmol/L (ref 22–32)
Calcium: 9.3 mg/dL (ref 8.9–10.3)
Chloride: 100 mmol/L (ref 98–111)
Creatinine, Ser: 0.7 mg/dL (ref 0.61–1.24)
GFR, Estimated: >60 mL/min (ref >60)
Glucose, Bld: 134 mg/dL — ABNORMAL HIGH (ref 70–99)
Potassium: 3 mmol/L — ABNORMAL LOW (ref 3.5–5.1)
Sodium: 135 mmol/L (ref 135–145)
Total Bilirubin: 2.4 mg/dL — ABNORMAL HIGH (ref 0.0–1.2)
Total Protein: 7.5 g/dL (ref 6.5–8.1)

## 2024-03-02 LAB — CBC
HCT: 52.1 % — ABNORMAL HIGH (ref 39.0–52.0)
Hemoglobin: 17.8 g/dL — ABNORMAL HIGH (ref 13.0–17.0)
MCH: 31 pg (ref 26.0–34.0)
MCHC: 34.2 g/dL (ref 30.0–36.0)
MCV: 90.8 fL (ref 80.0–100.0)
Platelets: 166 10*3/uL (ref 150–400)
RBC: 5.74 MIL/uL (ref 4.22–5.81)
RDW: 13.2 % (ref 11.5–15.5)
WBC: 13 10*3/uL — ABNORMAL HIGH (ref 4.0–10.5)
nRBC: 0 % (ref 0.0–0.2)

## 2024-03-02 LAB — GLUCOSE, CAPILLARY: Glucose-Capillary: 152 mg/dL — ABNORMAL HIGH (ref 70–99)

## 2024-03-02 LAB — PROTIME-INR
INR: 1 (ref 0.8–1.2)
Prothrombin Time: 14.2 s (ref 11.4–15.2)

## 2024-03-02 LAB — CBG MONITORING, ED
Glucose-Capillary: 136 mg/dL — ABNORMAL HIGH (ref 70–99)
Glucose-Capillary: 176 mg/dL — ABNORMAL HIGH (ref 70–99)

## 2024-03-02 LAB — ETHANOL: Alcohol, Ethyl (B): <15 mg/dL (ref <15)

## 2024-03-02 LAB — APTT: aPTT: 30 s (ref 24–36)

## 2024-03-02 MED ORDER — ACETAMINOPHEN 650 MG RE SUPP: 650.0000 mg | RECTAL | Status: DC | PRN

## 2024-03-02 MED ORDER — MAGNESIUM GLUCONATE 500 MG PO TABS: 500.0000 mg | ORAL_TABLET | Freq: Two times a day (BID) | ORAL | Status: DC

## 2024-03-02 MED ORDER — ASPIRIN 81 MG PO TBEC
81.0000 mg | DELAYED_RELEASE_TABLET | Freq: Two times a day (BID) | ORAL | Status: DC
  Administered 2024-03-03: 81 mg via ORAL
  Filled 2024-03-02: qty 1

## 2024-03-02 MED ORDER — CITALOPRAM HYDROBROMIDE 20 MG PO TABS
20.0000 mg | ORAL_TABLET | Freq: Every day | ORAL | Status: DC
  Administered 2024-03-03: 20 mg via ORAL
  Filled 2024-03-02: qty 1

## 2024-03-02 MED ORDER — STROKE: EARLY STAGES OF RECOVERY BOOK
Freq: Once | Status: AC
  Filled 2024-03-02: qty 1

## 2024-03-02 MED ORDER — SODIUM CHLORIDE 0.9 % IV SOLN: INTRAVENOUS | Status: DC

## 2024-03-02 MED ORDER — DAPAGLIFLOZIN PROPANEDIOL 10 MG PO TABS
10.0000 mg | ORAL_TABLET | Freq: Every day | ORAL | Status: DC
  Administered 2024-03-03: 10 mg via ORAL
  Filled 2024-03-02 (×2): qty 1

## 2024-03-02 MED ORDER — ACETAMINOPHEN 160 MG/5ML PO SOLN: 650.0000 mg | ORAL | Status: DC | PRN

## 2024-03-02 MED ORDER — ACETAMINOPHEN 325 MG PO TABS: 650.0000 mg | ORAL_TABLET | ORAL | Status: DC | PRN

## 2024-03-02 MED ORDER — VITAMIN D 25 MCG (1000 UNIT) PO TABS
5000.0000 [IU] | ORAL_TABLET | Freq: Every day | ORAL | Status: DC
  Administered 2024-03-03: 5000 [IU] via ORAL
  Filled 2024-03-02: qty 5

## 2024-03-02 MED ORDER — TRAZODONE HCL 50 MG PO TABS: 25.0000 mg | ORAL_TABLET | Freq: Every evening | ORAL | Status: DC | PRN

## 2024-03-02 MED ORDER — EZETIMIBE 10 MG PO TABS
10.0000 mg | ORAL_TABLET | Freq: Every day | ORAL | Status: DC
  Administered 2024-03-03: 10 mg via ORAL
  Filled 2024-03-02: qty 1

## 2024-03-02 MED ORDER — ENOXAPARIN SODIUM 60 MG/0.6ML IJ SOSY
0.5000 mg/kg | PREFILLED_SYRINGE | INTRAMUSCULAR | Status: DC
  Administered 2024-03-02: 45 mg via SUBCUTANEOUS
  Filled 2024-03-02: qty 0.6

## 2024-03-02 MED ORDER — CYCLOSPORINE 0.05 % OP EMUL
1.0000 [drp] | Freq: Two times a day (BID) | OPHTHALMIC | Status: DC
  Administered 2024-03-02: 1 [drp] via OPHTHALMIC
  Filled 2024-03-02: qty 30

## 2024-03-02 MED ORDER — NITROGLYCERIN 0.4 MG SL SUBL: 0.4000 mg | SUBLINGUAL_TABLET | SUBLINGUAL | Status: DC | PRN

## 2024-03-02 MED ORDER — IOHEXOL 350 MG/ML SOLN
75.0000 mL | Freq: Once | INTRAVENOUS | Status: AC | PRN
Start: 2024-03-02 — End: 2024-03-02
  Administered 2024-03-02: 75 mL via INTRAVENOUS

## 2024-03-02 MED ORDER — MAGNESIUM HYDROXIDE 400 MG/5ML PO SUSP: 30.0000 mL | Freq: Every day | ORAL | Status: DC | PRN

## 2024-03-02 MED ORDER — INSULIN ASPART 100 UNIT/ML IJ SOLN
0.0000 [IU] | Freq: Three times a day (TID) | INTRAMUSCULAR | Status: DC
  Administered 2024-03-03 (×2): 1 [IU] via SUBCUTANEOUS

## 2024-03-02 MED ORDER — ACETAMINOPHEN 500 MG PO TABS
500.0000 mg | ORAL_TABLET | Freq: Once | ORAL | Status: AC
  Administered 2024-03-02: 500 mg via ORAL
  Filled 2024-03-02: qty 1

## 2024-03-02 MED ORDER — VITAMIN C 500 MG PO TABS
500.0000 mg | ORAL_TABLET | Freq: Every day | ORAL | Status: DC
  Administered 2024-03-03: 500 mg via ORAL
  Filled 2024-03-02: qty 1

## 2024-03-02 MED ORDER — AMLODIPINE BESYLATE 5 MG PO TABS
10.0000 mg | ORAL_TABLET | Freq: Every day | ORAL | Status: DC
  Administered 2024-03-03: 10 mg via ORAL
  Filled 2024-03-02: qty 2

## 2024-03-02 MED ORDER — INSULIN ASPART 100 UNIT/ML IJ SOLN: 0.0000 [IU] | Freq: Every day | INTRAMUSCULAR | Status: DC

## 2024-03-02 MED ORDER — MAGNESIUM OXIDE -MG SUPPLEMENT 400 (240 MG) MG PO TABS
400.0000 mg | ORAL_TABLET | Freq: Two times a day (BID) | ORAL | Status: DC
  Administered 2024-03-02 – 2024-03-03 (×2): 400 mg via ORAL
  Filled 2024-03-02 (×2): qty 1

## 2024-03-02 MED ORDER — PANTOPRAZOLE SODIUM 40 MG PO TBEC
40.0000 mg | DELAYED_RELEASE_TABLET | Freq: Two times a day (BID) | ORAL | Status: DC
  Administered 2024-03-02 – 2024-03-03 (×2): 40 mg via ORAL
  Filled 2024-03-02 (×2): qty 1

## 2024-03-02 MED ORDER — ATORVASTATIN CALCIUM 40 MG PO TABS
40.0000 mg | ORAL_TABLET | Freq: Every day | ORAL | Status: DC
  Administered 2024-03-03: 40 mg via ORAL
  Filled 2024-03-02: qty 1

## 2024-03-02 NOTE — H&P (Signed)
 [place]   PATIENT NAME: Randy Hebert    MR#:  [id number]  DATE OF BIRTH:  1965-12-22  DATE OF ADMISSION:  03/02/2024  PRIMARY CARE PHYSICIAN: Cook, Jayce G, DO   Patient is coming from: Home  REQUESTING/REFERRING PHYSICIAN: Arnetta Elsie, MD  CHIEF COMPLAINT:   Chief Complaint  Patient presents with   Cerebrovascular Accident    HISTORY OF PRESENT ILLNESS:  Randy Hebert is a 58 y.o. male with medical history significant for anxiety, asthma, back pain, coronary artery disease, CHF, COPD, depression type 2 diabetes mellitus, hypertension dyslipidemia and OSA, on CPAP nightly, who presented to the emergency room with acute onset of recent acute CVA with left-sided weakness that has improved since my then the patient had an outpatient MRI that confirmed his CVA.  He has been having difficulty with his gait and balance.  He was called by his PCP to confirm the MRI results.  He admits to headache without dizziness or blurry vision or diplopia.  No tinnitus or vertigo.  No urinary or stool incontinence.  No nausea or vomiting or abdominal pain.  No chest pain or palpitations.  No cough or wheezing or dyspnea.  No dysuria, oliguria or hematuria or flank pain.  ED Course: When the patient came to the ER, BP was 155/83 with heart rate 105 respiratory rate of 23 with hypoxia to 85-86% on room air that was up to 92% on 3 L of O2 by nasal cannula and later 94% on 2 L, otherwise normal vital signs.  Labs revealed hypokalemia of 3 with total bili of 2.4 and otherwise unremarkable CMP.  CBC showed leukocytosis 13 and hemoconcentration and neutrophilia.  Coag profile was normal.  Alcohol  level was less than 15.  EKG EKG as reviewed by me : EKG showed sinus tachycardia with rate 101 and prolonged PR interval with right bundle branch block and T wave inversion anteroseptally. Imaging: 2 view chest x-ray showed no acute cardiopulmonary disease.  Showed a loop recorder and chronic lung  changes.  Brain MRI without contrast revealed acute/subacute lacunar infarct at the right thalamus capsular junction.  The patient will be admitted to an observation medical telemetry bed for further evaluation and management. PAST MEDICAL HISTORY:   Past Medical History:  Diagnosis Date   ACE inhibitor-aggravated angioedema    Allergy     Anemia    Angio-edema    Anxiety    Arthritis    Asthma    hip replacement   Back pain    Bradycardia 04/28/2017   Bulging of cervical intervertebral disc    CAD (coronary artery disease)    lateral STEMI 02/06/2015 00% D1 occlusion treated with Promus Premier 2.5 mm x 16 mm DES, 70% ramus stenosis, 40% mid RCA stenosis, 45% distal RCA stenosis, EF 45-50%   CHF (congestive heart failure) (HCC)    COPD (chronic obstructive pulmonary disease) (HCC)    Depression    Diabetes mellitus without complication (HCC)    Difficult intubation    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   Dry eye    Dyspnea    Early satiety 09/23/2016   Fatty liver    GERD (gastroesophageal reflux disease)    HCAP (healthcare-associated pneumonia) 05/15/2017   Headache    Heart murmur    Hip pain    Hyperlipidemia    Hypertension    Lobar pneumonia (HCC) 05/15/2017   Melena 08/04/2018   MI (myocardial infarction) (HCC)  Myocardial infarction [facility])    Neck pain    Non-ST elevation (NSTEMI) myocardial infarction (HCC) 04/27/2017   NSTEMI (non-ST elevated myocardial infarction) (HCC) 04/26/2017   Otitis media    Pleurisy    Pneumonia due to COVID-19 virus 10/07/2019   Rectal bleeding 11/08/2018   Right shoulder pain 03/20/2020   Sinus pause    9 sec sinus pause on telemetry after started on coreg  after MI, avoid AV nodal blocking agent   Sleep apnea    uses a cpap   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   STEMI (ST elevation myocardial infarction) (HCC) 05/31/2015   Substance abuse (HCC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   Syncope 06/29/2017    Syncope and collapse 05/15/2017   Transaminitis 08/04/2018   Unilateral primary osteoarthritis, right hip 07/01/2016    PAST SURGICAL HISTORY:   Past Surgical History:  Procedure Laterality Date   BIOPSY  10/09/2016   Procedure: BIOPSY;  Surgeon: Lamar CHRISTELLA Hollingshead, MD;  [facility]: [facility];  Service: Endoscopy;;   BIOPSY  11/28/2019   Procedure: BIOPSY;  Surgeon: Hollingshead Lamar CHRISTELLA, MD;  [facility]: [facility];  Service: Endoscopy;;   BIOPSY  06/15/2023   Procedure: BIOPSY;  Surgeon: Hollingshead Lamar CHRISTELLA, MD;  [facility]: [facility];  Service: Endoscopy;;   BRONCHIAL BIOPSY  10/16/2022   Procedure: BRONCHIAL BIOPSIES;  Surgeon: Gladis Leonor CHRISTELLA, MD;  Location: [facility] ENDOSCOPY;  Service: Pulmonary;;   BRONCHIAL NEEDLE ASPIRATION BIOPSY  10/16/2022   Procedure: BRONCHIAL NEEDLE ASPIRATION BIOPSIES;  Surgeon: Gladis Leonor CHRISTELLA, MD;  Location: [facility] ENDOSCOPY;  Service: Pulmonary;;   BRONCHIAL WASHINGS  10/16/2022   Procedure: BRONCHIAL WASHINGS;  Surgeon: Gladis Leonor CHRISTELLA, MD;  Location: [facility] ENDOSCOPY;  Service: Pulmonary;;   CARDIAC CATHETERIZATION N/A 02/06/2015   Procedure: Left Heart Cath and Coronary Angiography;  Surgeon: Alm LELON Clay, MD;  Location: [facility] INVASIVE CV LAB;  Service: Cardiovascular;  Laterality: N/A;   CARDIAC CATHETERIZATION N/A 02/06/2015   Procedure: Coronary Stent Intervention;  Surgeon: Alm LELON Clay, MD;  Location: [facility] INVASIVE CV LAB;  Service: Cardiovascular;  Laterality: N/A;   COLONOSCOPY WITH PROPOFOL  N/A 10/09/2016   Sigmoid and descending colon diverticulosis, four 4-6 mm polyps in sigmoid, one 4 mm polyp in descending. Tubular adenomas and hyperplastic. 5 year surveillance.    COLONOSCOPY WITH PROPOFOL  N/A 11/24/2016   Sigmoid and descending colon diverticulosis, four 4-6 mm polyps in sigmoid, one 4 mm polyp in descending. Tubular adenomas and hyperplastic. 5 year surveillance.    COLONOSCOPY WITH PROPOFOL  N/A 10/18/2021   Carver: nonbleeding internal  hemorrhoids, small and large mouth diverticula found in the sigmoid, descending, transverse colon.  A 9 mm sessile polyp was found in the ascending colon that was removed.  4 sessile polyps found in the sigmoid, descending, transverse colon 3 to 5 mm in size, examining otherwise.  Path revealed tubular adenomas.  Repeat due in 5 years for surveillance.   CORONARY ANGIOPLASTY WITH STENT PLACEMENT  01/2015   CORONARY STENT INTERVENTION N/A 04/27/2017   Procedure: CORONARY STENT INTERVENTION;  Surgeon: Mady Bruckner, MD;  Location: [facility] INVASIVE CV LAB;  Service: Cardiovascular;  Laterality: N/A;   ELECTROPHYSIOLOGY STUDY N/A 06/29/2017   Procedure: ELECTROPHYSIOLOGY STUDY;  Surgeon: Waddell Danelle LELON, MD;  Location: [facility] INVASIVE CV LAB;  Service: Cardiovascular;  Laterality: N/A;   ESOPHAGOGASTRODUODENOSCOPY (EGD) WITH PROPOFOL  N/A 10/09/2016   Dr. Hollingshead: LA grade a esophagitis.  Barrett's esophagus, biopsy-proven.  Small hiatal hernia.  EGD February 2019  ESOPHAGOGASTRODUODENOSCOPY (EGD) WITH PROPOFOL  N/A 11/28/2019    salmon-colored esophageal mucosa (Barrett's) small hiatal hernia, portal hypertensive gastropathy, normal duodenum, 3 year surveillance   ESOPHAGOGASTRODUODENOSCOPY (EGD) WITH PROPOFOL  N/A 09/06/2021   three columns of grade 1 varices in distal esophagus, no stigmata of bleeding or red wale signs. Small hiatal hernia. Mild portal gastropathy. Normal duodenum. Repeat upper endoscopy in 1 year for surveillance.   ESOPHAGOGASTRODUODENOSCOPY (EGD) WITH PROPOFOL  N/A 06/15/2023   Procedure: ESOPHAGOGASTRODUODENOSCOPY (EGD) WITH PROPOFOL ;  Surgeon: Shaaron Lamar HERO, MD;  [facility];  Service: Endoscopy;  Laterality: N/A;  830am, asa 3   FIDUCIAL MARKER PLACEMENT  10/16/2022   Procedure: FIDUCIAL MARKER PLACEMENT;  Surgeon: Gladis Leonor HERO, MD;  Location: [facility] ENDOSCOPY;  Service: Pulmonary;;   INCISION / DRAINAGE HAND / FINGER     LEFT HEART CATH AND CORONARY ANGIOGRAPHY N/A  04/27/2017   Procedure: LEFT HEART CATH AND CORONARY ANGIOGRAPHY;  Surgeon: Mady Bruckner, MD;  Location: [facility] INVASIVE CV LAB;  Service: Cardiovascular;  Laterality: N/A;   LOOP RECORDER INSERTION  06/29/2017   Procedure: Loop Recorder Insertion;  Surgeon: Waddell Danelle ORN, MD;  Location: [facility] INVASIVE CV LAB;  Service: Cardiovascular;;   POLYPECTOMY  11/24/2016   Procedure: POLYPECTOMY;  Surgeon: Lamar HERO Shaaron, MD;  [facility];  Service: Endoscopy;;  descending and sigmoid   POLYPECTOMY  10/18/2021   Procedure: POLYPECTOMY;  Surgeon: Cindie Carlin POUR, DO;  Location: [facility];  Service: Endoscopy;;   TOTAL HIP ARTHROPLASTY Right 07/01/2016   TOTAL HIP ARTHROPLASTY Right 07/01/2016   Procedure: RIGHT TOTAL HIP ARTHROPLASTY ANTERIOR APPROACH;  Surgeon: Bruckner CINDERELLA Poli, MD;  Location: [facility] OR;  Service: Orthopedics;  Laterality: Right;    SOCIAL HISTORY:   Social History   Tobacco Use   Smoking status: Every Day    Current packs/day: 1.00    Average packs/day: 1 pack/day for 46.0 years (46.0 ttl pk-yrs)    Types: Cigarettes    Passive exposure: Never   Smokeless tobacco: Former    Types: Chew   Tobacco comments:    Smokes 1 pack a day of cigarettes (20 cig)10/16/23  Substance Use Topics   Alcohol  use: Yes    Comment: rare beer use    FAMILY HISTORY:   Family History  Problem Relation Age of Onset   Heart attack Father    Stroke Father    Arthritis Father    Heart disease Father    Cancer Mother        ???   Arthritis Mother    Heart disease Brother 68       died in sleep   Early death Brother    Diabetes Maternal Uncle    Alzheimer's disease Maternal Grandmother     DRUG ALLERGIES:   Allergies  Allergen Reactions   Carvedilol  Other (See Comments)    Sinus pause on telemetry >3 seconds. Longest one 9 sec. No AV nodal agent   Lisinopril  Anaphylaxis, Shortness Of Breath and Swelling    Angioedema, required intubation and mechanical ventilation    Chantix  [Varenicline ] Other (See Comments)    Nightmares and unable to sleep well   Amoxicillin  Nausea Only    Childhood reaction     REVIEW OF SYSTEMS:   ROS As per history of present illness. All pertinent systems were reviewed above. Constitutional, HEENT, cardiovascular, respiratory, GI, GU, musculoskeletal, neuro, psychiatric, endocrine, integumentary and hematologic systems were reviewed and are otherwise negative/unremarkable except for positive findings mentioned above in  the HPI.   MEDICATIONS AT HOME:   Prior to Admission medications   Medication Sig Start Date End Date Taking? Authorizing Provider  acetaminophen  (TYLENOL ) 500 MG tablet Take 2 tablets (1,000 mg total) by mouth every 8 (eight) hours as needed for mild pain or fever. 09/06/21  Yes Jens Durand, MD  amLODipine  (NORVASC ) 10 MG tablet Take 1 tablet (10 mg total) by mouth daily. 10/14/23  Yes Branch, Dorn FALCON, MD  ascorbic acid  (VITAMIN C) 500 MG tablet Take 1 tablet (500 mg total) by mouth daily. 10/12/19  Yes Ricky Fines, MD  aspirin  EC 81 MG tablet Take 81 mg by mouth 2 (two) times daily. Swallow whole.   Yes [provider]  atorvastatin  (LIPITOR ) 40 MG tablet Take 1 tablet (40 mg total) by mouth daily. 12/09/23  Yes BranchDorn FALCON, MD  Cholecalciferol (VITAMIN D3) 125 MCG (5000 UT) CAPS Take 1 capsule (5,000 Units total) by mouth daily. 07/19/19  Yes Nida, Gebreselassie W, MD  citalopram  (CELEXA ) 20 MG tablet Take 1 tablet (20 mg total) by mouth daily. 01/04/24  Yes Cook, Jayce G, DO  dapagliflozin  propanediol (FARXIGA ) 10 MG TABS tablet Take 1 tablet (10 mg total) by mouth daily. 12/10/23  Yes Nida, Gebreselassie W, MD  ezetimibe  (ZETIA ) 10 MG tablet Take 1 tablet (10 mg total) by mouth daily. 10/14/23  Yes Alvan Dorn FALCON, MD  ferrous sulfate  325 (65 FE) MG tablet Take 1 tablet (325 mg total) by mouth daily with breakfast. 04/02/21  Yes Elnor Fairy HERO, NP  magnesium  gluconate (MAGONATE) 500 MG  tablet Take 500 mg by mouth 2 (two) times daily.   Yes [provider]  metFORMIN  (GLUCOPHAGE ) 500 MG tablet Take 1 tablet (500 mg total) by mouth 2 (two) times daily with a meal. 10/15/23  Yes Nida, Ethelle ORN, MD  naproxen  (NAPROSYN ) 250 MG tablet Take 250 mg by mouth every 6 (six) hours as needed for mild pain (pain score 1-3).   Yes [provider]  nitroGLYCERIN  (NITROSTAT ) 0.4 MG SL tablet Place 1 tablet (0.4 mg total) under the tongue every 5 (five) minutes x 3 doses as needed (if no relief after 3rd dose, proceed to ED or call 911). 05/18/23  Yes Branch, Dorn FALCON, MD  pantoprazole  (PROTONIX ) 40 MG tablet TAKE (1) TABLET BY MOUTH TWICE DAILY BEFORE A MEAL. 01/11/24  Yes Rourk, Lamar HERO, MD  RESTASIS  0.05 % ophthalmic emulsion Place 1 drop into both eyes 2 (two) times daily. 02/26/24  Yes [provider]  SYMBICORT  160-4.5 MCG/ACT inhaler INHALE 2 PUFFS INTO THE LUNGS FIRST THING IN THE MORNING AND 2 PUFFS ABOUT 12 HOURS LATER. 12/17/23  Yes Cook, Jayce G, DO  TRULICITY  1.5 MG/0.5ML SOAJ Inject 1.5 mg into the skin once a week. 01/18/24  Yes Nida, Gebreselassie W, MD  Blood Glucose Monitoring Suppl (ACCU-CHEK GUIDE ME) w/Device KIT 1 Piece by Does not apply route as directed. 02/20/22   Nida, Gebreselassie W, MD  glucose blood (ACCU-CHEK GUIDE TEST) test strip Use to monitor glucose 2 times daily as instructed 02/02/24   Lenis Ethelle ORN, MD      VITAL SIGNS:  Blood pressure 132/77, pulse 98, temperature 98.2 F (36.8 C), temperature source Oral, resp. rate 20, height 5' 5 (1.651 m), weight 87.9 kg, SpO2 94%.  PHYSICAL EXAMINATION:  Physical Exam  GENERAL:  58 y.o.-year-old male patient lying in the bed with no acute distress.  EYES: Pupils equal, round, reactive to light and accommodation. No  scleral icterus. Extraocular muscles intact.  HEENT: Head atraumatic, normocephalic. Oropharynx and nasopharynx clear.  NECK:  Supple, no jugular venous distention.  No thyroid  enlargement, no tenderness.  LUNGS: Normal breath sounds bilaterally, no wheezing, rales,rhonchi or crepitation. No use of accessory muscles of respiration.  CARDIOVASCULAR: Regular rate and rhythm, S1, S2 normal. No murmurs, rubs, or gallops.  ABDOMEN: Soft, nondistended, nontender. Bowel sounds present. No organomegaly or mass.  EXTREMITIES: No pedal edema, cyanosis, or clubbing.  NEUROLOGIC: Cranial nerves II through XII are intact. Muscle strength 5/5 in all extremities. Sensation intact. Gait not checked.  PSYCHIATRIC: The patient is alert and oriented x 3.  Normal affect and good eye contact. SKIN: No obvious rash, lesion, or ulcer.   LABORATORY PANEL:   CBC Recent Labs  Lab 03/02/24 1920  WBC 13.0*  HGB 17.8*  HCT 52.1*  PLT 166   ------------------------------------------------------------------------------------------------------------------  Chemistries  Recent Labs  Lab 03/02/24 1920  NA 135  K 3.0*  CL 100  CO2 23  GLUCOSE 134*  BUN 10  CREATININE 0.70  CALCIUM  9.3  AST 37  ALT 43  ALKPHOS 97  BILITOT 2.4*   ------------------------------------------------------------------------------------------------------------------  Cardiac Enzymes No results for input(s): TROPONINI in the last 168 hours. ------------------------------------------------------------------------------------------------------------------  RADIOLOGY:  CT ANGIO HEAD NECK W WO CM Result Date: 03/02/2024 CLINICAL DATA:  Stroke/TIA, determine embolic source EXAM: CT ANGIOGRAPHY HEAD AND NECK WITH AND WITHOUT CONTRAST TECHNIQUE: Multidetector CT imaging of the head and neck was performed using the standard protocol during bolus administration of intravenous contrast. Multiplanar CT image reconstructions and MIPs were obtained to evaluate the vascular anatomy. Carotid stenosis measurements (when applicable) are obtained utilizing NASCET criteria, using the distal internal carotid  diameter as the denominator. RADIATION DOSE REDUCTION: This exam was performed according to the departmental dose-optimization program which includes automated exposure control, adjustment of the mA and/or kV according to patient size and/or use of iterative reconstruction technique. CONTRAST:  75mL OMNIPAQUE  IOHEXOL  350 MG/ML SOLN COMPARISON:  Same day MRI head. FINDINGS: CT HEAD FINDINGS Brain: Known acute infarct better characterized on recent MRI. No evidence of progressive mass effect or acute hemorrhage. No evidence of mass lesion, midline shift or hydrocephalus. Vascular: See below. Skull: No acute fracture. Sinuses/Orbits: Clear sinuses.  No acute orbital findings. Other: No mastoid effusions. Soft tissue thickening of the ears/pinna bilaterally with areas of calcification, which is chronic. Review of the MIP images confirms the above findings CTA NECK FINDINGS Aortic arch: Great vessel origins are patent without significant stenosis. Right-sided aortic arch. Aortic atherosclerosis. Right carotid system: No evidence of dissection, stenosis (50% or greater), or occlusion. Left carotid system: No evidence of dissection, stenosis (50% or greater), or occlusion. Vertebral arteries: Left dominant. No evidence of dissection, stenosis (50% or greater), or occlusion. Skeleton: Severe multilevel degenerative change. Other neck: No evidence of acute abnormality on limited assessment. Upper chest: Visualized lung apices are clear. Review of the MIP images confirms the above findings CTA HEAD FINDINGS Anterior circulation: Bilateral intracranial ICAs, MCAs, and ACAs are patent without proximal hemodynamically significant stenosis. Posterior circulation: Bilateral intradural vertebral arteries, basilar artery and bilateral posterior cerebral arteries are patent without proximal hemodynamically significant stenosis. Venous sinuses: As permitted by contrast timing, patent. Review of the MIP images confirms the above  findings IMPRESSION: 1. No emergent large vessel occlusion or proximal hemodynamically significant stenosis. 2. Right-sided aortic arch and aortic atherosclerosis (ICD10-I70.0). Electronically Signed   By: Gilmore GORMAN Molt M.D.   On: 03/02/2024 22:36  MR Brain Wo Contrast Result Date: 03/02/2024 CLINICAL DATA:  Neuro deficit, acute, stroke suspected left sided weakness, gait instability, dizziness, nausea (started Sunday night) EXAM: MRI HEAD WITHOUT CONTRAST TECHNIQUE: Multiplanar, multiecho pulse sequences of the brain and surrounding structures were obtained without intravenous contrast. COMPARISON:  MRI of the head dated May 07, 2023. FINDINGS: Brain: There is an acute nonhemorrhagic infarct present at the right thalamus capsular junction seen on image 28 of series 5. There is also spurious high signal on the same image within the left frontal cortex, of no significance. There is age-related cerebral and cerebellar volume loss present. 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 There is no evidence of hemorrhage, mass or hydrocephalus. Vascular: Normal vascular flow voids. Skull and upper cervical spine: Normal marrow signal. No osseous lesions evident. Sinuses/Orbits: Negative. Other: None. IMPRESSION: 1. Acute/subacute lacunar infarct will at the right thalamus capsular junction. 2. These results were called by telephone at the time of interpretation on 03/02/2024 at 6:51 pm to provider Dr. JACQULYN AHLE , who verbally acknowledged these results. Electronically Signed   By: Evalene Coho M.D.   On: 03/02/2024 18:52      IMPRESSION AND PLAN:  Assessment and Plan: * Acute CVA (cerebrovascular accident) (HCC) - This is an acute/subacute lacunar infarct of the right thalamus capsular junction with subsequent left-sided weakness that has significant improved. - The patient will be admitted to an observation medically monitored bed.   - We will follow neuro checks q.4  hours for 24 hours.   - The patient will be placed on aspirin .   - Will obtain a 2D echo with bubble study as well as a head and neck CTA.. - A neurology consultation  as well as physical/occupation/speech therapy consults will be obtained in a.m.SABRA   - The patient will be placed on statin therapy and fasting lipids will be checked.   Essential hypertension - Will continue antihypertensive therapy.  Dyslipidemia - Will continue statin therapy and Zetia .  Type 2 diabetes mellitus without complications (HCC) - The patient will be placed on supplemental coverage with NovoLog . - Will hold off metformin . - Will continue Farxiga .  Depression - Will continue Celexa .  Chronic combined systolic and diastolic CHF (congestive heart failure) (HCC) - Will continue Farxiga . - Will hold off metformin .  OSA (obstructive sleep apnea) - We will resume CPAP nightly.   DVT prophylaxis: Lovenox .  Advanced Care Planning:  Code Status: full code.  Family Communication:  The plan of care was discussed in details with the patient (and family). I answered all questions. The patient agreed to proceed with the above mentioned plan. Further management will depend upon hospital course. Disposition Plan: Back to previous home environment Consults called: none.  All the records are reviewed and case discussed with ED provider.  Status is: Observation  I certify that at the time of admission, it is my clinical judgment that the patient will require hospital care extending less than 2 midnights.                            Dispo: The patient is from: Home              Anticipated d/c is to: Home              Patient currently is not medically stable to d/c.              Difficult to  place patient: No  Madison DELENA Peaches M.D on 03/02/2024 at 10:45 PM  [facility]   From 7 PM-7 AM, contact night-coverage [email or web address]  CC: Primary care physician; Cook, Jayce G, DO

## 2024-03-02 NOTE — Assessment & Plan Note (Addendum)
-   This is an acute/subacute lacunar infarct of the right thalamus capsular junction with subsequent left-sided weakness that has significant improved. - The patient will be admitted to an observation medically monitored bed.   - We will follow neuro checks q.4 hours for 24 hours.   - The patient will be placed on aspirin .   - Will obtain a 2D echo with bubble study as well as a head and neck CTA.. - A neurology consultation  as well as physical/occupation/speech therapy consults will be obtained in a.m.Randy Hebert - I sent a timed message for Dr. Shelton at 7 AM. - The patient will be placed on statin therapy and fasting lipids will be checked.

## 2024-03-02 NOTE — ED Notes (Signed)
 Pt O2 ranging 85-90% on RA. Pt asymptomatic. 2L O2 provided via [place]. [place] MD notified.

## 2024-03-02 NOTE — Assessment & Plan Note (Signed)
Will continue antihypertensive therapy.

## 2024-03-02 NOTE — Progress Notes (Signed)
 PHARMACIST - PHYSICIAN COMMUNICATION  CONCERNING:  Enoxaparin  (Lovenox ) for DVT Prophylaxis    RECOMMENDATION: Patient was prescribed enoxaprin 40mg  q24 hours for VTE prophylaxis.   Filed Weights   03/02/24 1909  Weight: 88.5 kg (195 lb)    Body mass index is 32.45 kg/m.  Estimated Creatinine Clearance: 101.7 mL/min (by C-G formula based on SCr of 0.7 mg/dL).   Based on [facility] policy patient is candidate for enoxaparin  0.5mg /kg TBW SQ every 24 hours based on BMI being >30.  DESCRIPTION: Pharmacy has adjusted enoxaparin  dose per [facility] policy.  Patient is now receiving enoxaparin  45 mg every 24 hours    Damien Napoleon, PharmD Clinical Pharmacist  03/02/2024 9:22 PM

## 2024-03-02 NOTE — Progress Notes (Signed)
   03/02/24 2300  BiPAP/CPAP/SIPAP  $ Non-Invasive Ventilator  Non-Invasive Vent Set Up  $ Face Mask Large  Yes  BiPAP/CPAP/SIPAP Pt Type Adult  BiPAP/CPAP/SIPAP DREAMSTATIOND  Mask Type Full face mask  Dentures removed? Not applicable  Mask Size Large  Respiratory Rate 20 breaths/min  Flow Rate 3 lpm  Patient Home Machine No  Patient Home Mask No  Patient Home Tubing No  Auto Titrate Yes  Minimum cmH2O 4 cmH2O  Maximum cmH2O 18 cmH2O  Device Plugged into RED Power Outlet Yes  BiPAP/CPAP /SiPAP Vitals  Pulse Rate 93  Resp 20  SpO2 93 %  Bilateral Breath Sounds Clear;Diminished  MEWS Score/Color  MEWS Score 0  MEWS Score Color [place]

## 2024-03-02 NOTE — Assessment & Plan Note (Signed)
-   The patient will be placed on supplemental coverage with NovoLog. - Will hold off metformin. - Will continue [place].

## 2024-03-02 NOTE — ED Notes (Signed)
 Received from outpt MRI, stroke + per radiologist

## 2024-03-02 NOTE — ED Triage Notes (Addendum)
 Pt via POV after having a stroke on Sunday. Pt a/o x 4 and ambulatory at time of triage. Pt states he had balance issues on Monday but today his mobility has improved. Pt reports left-sided weakness, also improved since Monday. Pt received a call from his PCP during triage and was informed that MRI confirmed that he had a stroke.

## 2024-03-02 NOTE — ED Provider Notes (Signed)
 [facility] Provider Note  CSN: [id number] Arrival date & time: 03/02/24 1900  Chief Complaint(s) Cerebrovascular Accident  HPI Randy Hebert is a 58 y.o. male history of coronary artery disease, cirrhosis, CHF, COPD, diabetes presenting to the emergency department with weakness.  Patient reports that a few days ago he had sudden onset of left-sided weakness in his arm and leg, trouble walking.  Felt like that he should just wait and see if he got better on its own.  Had visit with his primary doctor who thought he might of had a stroke.  Reports that the weakness did improve.  MRI was ordered by primary doctor and showed stroke so was sent to the ER.  Denies chest pain, back pain, fevers or chills, headache, head injury, leg swelling, any other new symptoms.   Past Medical History Past Medical History:  Diagnosis Date   ACE inhibitor-aggravated angioedema    Allergy     Anemia    Angio-edema    Anxiety    Arthritis    Asthma    hip replacement   Back pain    Bradycardia 04/28/2017   Bulging of cervical intervertebral disc    CAD (coronary artery disease)    lateral STEMI 02/06/2015 00% D1 occlusion treated with Promus Premier 2.5 mm x 16 mm DES, 70% ramus stenosis, 40% mid RCA stenosis, 45% distal RCA stenosis, EF 45-50%   CHF (congestive heart failure) (HCC)    COPD (chronic obstructive pulmonary disease) (HCC)    Depression    Diabetes mellitus without complication (HCC)    Difficult intubation    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   Dry eye    Dyspnea    Early satiety 09/23/2016   Fatty liver    GERD (gastroesophageal reflux disease)    HCAP (healthcare-associated pneumonia) 05/15/2017   Headache    Heart murmur    Hip pain    Hyperlipidemia    Hypertension    Lobar pneumonia (HCC) 05/15/2017   Melena 08/04/2018   MI (myocardial infarction) (HCC)    Myocardial infarction (HCC)    Neck pain    Non-ST elevation (NSTEMI)  myocardial infarction (HCC) 04/27/2017   NSTEMI (non-ST elevated myocardial infarction) (HCC) 04/26/2017   Otitis media    Pleurisy    Pneumonia due to COVID-19 virus 10/07/2019   Rectal bleeding 11/08/2018   Right shoulder pain 03/20/2020   Sinus pause    9 sec sinus pause on telemetry after started on coreg  after MI, avoid AV nodal blocking agent   Sleep apnea    uses a cpap   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   STEMI (ST elevation myocardial infarction) (HCC) 05/31/2015   Substance abuse (HCC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   Syncope 06/29/2017   Syncope and collapse 05/15/2017   Transaminitis 08/04/2018   Unilateral primary osteoarthritis, right hip 07/01/2016   Patient Active Problem List   Diagnosis Date Noted   Acute CVA (cerebrovascular accident) (HCC) 03/02/2024   Essential hypertension 03/02/2024   Dyslipidemia 03/02/2024   Type 2 diabetes mellitus without complications (HCC) 03/02/2024   Depression 03/02/2024   Acute left-sided weakness 03/01/2024   Acute right-sided low back pain with right-sided sciatica 11/30/2023   Anxiety 04/23/2023   Erectile dysfunction 02/24/2023   Chronic eczematous otitis externa of both ears 02/05/2023   Tobacco abuse 12/25/2022   Malignant neoplasm of upper lobe of right lung (HCC) 09/25/2022   Cervical spondylosis with myelopathy  08/01/2022   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   Vocal cord dysfunction 05/21/2022   DDD (degenerative disc disease), cervical 05/21/2022   Osteoarthritis of both hands 05/19/2022   History of GI bleed 11/13/2021   Insomnia 09/13/2021   Cirrhosis of liver (HCC) 11/08/2018   Barrett's esophagus 08/04/2018   Vitamin D  deficiency 06/30/2018   Alcohol  abuse 06/30/2018   DM type 2 causing vascular disease (HCC) 08/17/2017   Chronic combined systolic and diastolic CHF (congestive heart failure) (HCC) 05/15/2017   OSA (obstructive sleep apnea) 04/28/2017   Depression with anxiety 04/15/2017   GERD  (gastroesophageal reflux disease) 09/23/2016   Asthmatic bronchitis , chronic (HCC) 08/27/2015   Mixed hyperlipidemia 08/27/2015   Class 1 obesity due to excess calories with serious comorbidity and body mass index (BMI) of 32.0 to 32.9 in adult 08/27/2015   CAD (coronary artery disease) 05/31/2015   Essential hypertension, benign 02/09/2015   Home Medication(s) Prior to Admission medications   Medication Sig Start Date End Date Taking? Authorizing Provider  acetaminophen  (TYLENOL ) 500 MG tablet Take 2 tablets (1,000 mg total) by mouth every 8 (eight) hours as needed for mild pain or fever. 09/06/21  Yes Jens Durand, MD  amLODipine  (NORVASC ) 10 MG tablet Take 1 tablet (10 mg total) by mouth daily. 10/14/23  Yes BranchDorn FALCON, MD  ascorbic acid  (VITAMIN C) 500 MG tablet Take 1 tablet (500 mg total) by mouth daily. 10/12/19  Yes Ricky Fines, MD  aspirin  EC 81 MG tablet Take 81 mg by mouth 2 (two) times daily. Swallow whole.   Yes [provider]  atorvastatin  (LIPITOR ) 40 MG tablet Take 1 tablet (40 mg total) by mouth daily. 12/09/23  Yes BranchDorn FALCON, MD  Cholecalciferol (VITAMIN D3) 125 MCG (5000 UT) CAPS Take 1 capsule (5,000 Units total) by mouth daily. 07/19/19  Yes Nida, Ethelle ORN, MD  citalopram  (CELEXA ) 20 MG tablet Take 1 tablet (20 mg total) by mouth daily. 01/04/24  Yes Cook, Jayce G, DO  dapagliflozin  propanediol (FARXIGA ) 10 MG TABS tablet Take 1 tablet (10 mg total) by mouth daily. 12/10/23  Yes Nida, Gebreselassie W, MD  ezetimibe  (ZETIA ) 10 MG tablet Take 1 tablet (10 mg total) by mouth daily. 10/14/23  Yes Alvan Dorn FALCON, MD  ferrous sulfate  325 (65 FE) MG tablet Take 1 tablet (325 mg total) by mouth daily with breakfast. 04/02/21  Yes Elnor Fairy HERO, NP  magnesium  gluconate (MAGONATE) 500 MG tablet Take 500 mg by mouth 2 (two) times daily.   Yes [provider]  metFORMIN  (GLUCOPHAGE ) 500 MG tablet Take 1 tablet (500 mg total) by mouth 2 (two)  times daily with a meal. 10/15/23  Yes Nida, Ethelle ORN, MD  naproxen  (NAPROSYN ) 250 MG tablet Take 250 mg by mouth every 6 (six) hours as needed for mild pain (pain score 1-3).   Yes [provider]  nitroGLYCERIN  (NITROSTAT ) 0.4 MG SL tablet Place 1 tablet (0.4 mg total) under the tongue every 5 (five) minutes x 3 doses as needed (if no relief after 3rd dose, proceed to ED or call 911). 05/18/23  Yes Branch, Dorn FALCON, MD  pantoprazole  (PROTONIX ) 40 MG tablet TAKE (1) TABLET BY MOUTH TWICE DAILY BEFORE A MEAL. 01/11/24  Yes Rourk, Lamar HERO, MD  RESTASIS  0.05 % ophthalmic emulsion Place 1 drop into both eyes 2 (two) times daily. 02/26/24  Yes [provider]  SYMBICORT  160-4.5 MCG/ACT inhaler INHALE 2 PUFFS INTO THE LUNGS FIRST THING IN THE MORNING AND  2 PUFFS ABOUT 12 HOURS LATER. 12/17/23  Yes Cook, Jayce G, DO  TRULICITY  1.5 MG/0.5ML SOAJ Inject 1.5 mg into the skin once a week. 01/18/24  Yes Nida, Gebreselassie W, MD  Blood Glucose Monitoring Suppl (ACCU-CHEK GUIDE ME) w/Device KIT 1 Piece by Does not apply route as directed. 02/20/22   Lenis Ethelle ORN, MD  glucose blood (ACCU-CHEK GUIDE TEST) test strip Use to monitor glucose 2 times daily as instructed 02/02/24   Lenis Ethelle ORN, MD                                                                                                                                    Past Surgical History Past Surgical History:  Procedure Laterality Date   BIOPSY  10/09/2016   Procedure: BIOPSY;  Surgeon: Lamar CHRISTELLA Hollingshead, MD;  Location: [facility];  Service: Endoscopy;;   BIOPSY  11/28/2019   Procedure: BIOPSY;  Surgeon: Hollingshead Lamar CHRISTELLA, MD;  Location: [facility];  Service: Endoscopy;;   BIOPSY  06/15/2023   Procedure: BIOPSY;  Surgeon: Hollingshead Lamar CHRISTELLA, MD;  Location: [facility];  Service: Endoscopy;;   BRONCHIAL BIOPSY  10/16/2022   Procedure: BRONCHIAL BIOPSIES;  Surgeon: Gladis Leonor CHRISTELLA, MD;  Location: [facility] ENDOSCOPY;   Service: Pulmonary;;   BRONCHIAL NEEDLE ASPIRATION BIOPSY  10/16/2022   Procedure: BRONCHIAL NEEDLE ASPIRATION BIOPSIES;  Surgeon: Gladis Leonor CHRISTELLA, MD;  Location: [facility] ENDOSCOPY;  Service: Pulmonary;;   BRONCHIAL WASHINGS  10/16/2022   Procedure: BRONCHIAL WASHINGS;  Surgeon: Gladis Leonor CHRISTELLA, MD;  Location: [facility] ENDOSCOPY;  Service: Pulmonary;;   CARDIAC CATHETERIZATION N/A 02/06/2015   Procedure: Left Heart Cath and Coronary Angiography;  Surgeon: Alm ORN Clay, MD;  Location: [facility] INVASIVE CV LAB;  Service: Cardiovascular;  Laterality: N/A;   CARDIAC CATHETERIZATION N/A 02/06/2015   Procedure: Coronary Stent Intervention;  Surgeon: Alm ORN Clay, MD;  Location: [facility] INVASIVE CV LAB;  Service: Cardiovascular;  Laterality: N/A;   COLONOSCOPY WITH PROPOFOL  N/A 10/09/2016   Sigmoid and descending colon diverticulosis, four 4-6 mm polyps in sigmoid, one 4 mm polyp in descending. Tubular adenomas and hyperplastic. 5 year surveillance.    COLONOSCOPY WITH PROPOFOL  N/A 11/24/2016   Sigmoid and descending colon diverticulosis, four 4-6 mm polyps in sigmoid, one 4 mm polyp in descending. Tubular adenomas and hyperplastic. 5 year surveillance.    COLONOSCOPY WITH PROPOFOL  N/A 10/18/2021   Carver: nonbleeding internal hemorrhoids, small and large mouth diverticula found in the sigmoid, descending, transverse colon.  A 9 mm sessile polyp was found in the ascending colon that was removed.  4 sessile polyps found in the sigmoid, descending, transverse colon 3 to 5 mm in size, examining otherwise.  Path revealed tubular adenomas.  Repeat due in 5 years for surveillance.   CORONARY ANGIOPLASTY WITH STENT PLACEMENT  01/2015   CORONARY STENT INTERVENTION N/A 04/27/2017   Procedure: CORONARY STENT INTERVENTION;  Surgeon: Mady Bruckner,  MD;  Location: [facility] INVASIVE CV LAB;  Service: Cardiovascular;  Laterality: N/A;   ELECTROPHYSIOLOGY STUDY N/A 06/29/2017   Procedure: ELECTROPHYSIOLOGY STUDY;  Surgeon: Waddell Danelle ORN, MD;  Location: [facility] INVASIVE CV LAB;  Service: Cardiovascular;  Laterality: N/A;   ESOPHAGOGASTRODUODENOSCOPY (EGD) WITH PROPOFOL  N/A 10/09/2016   Dr. Shaaron: LA grade a esophagitis.  Barrett's esophagus, biopsy-proven.  Small hiatal hernia.  EGD February 2019   ESOPHAGOGASTRODUODENOSCOPY (EGD) WITH PROPOFOL  N/A 11/28/2019    salmon-colored esophageal mucosa (Barrett's) small hiatal hernia, portal hypertensive gastropathy, normal duodenum, 3 year surveillance   ESOPHAGOGASTRODUODENOSCOPY (EGD) WITH PROPOFOL  N/A 09/06/2021   three columns of grade 1 varices in distal esophagus, no stigmata of bleeding or red wale signs. Small hiatal hernia. Mild portal gastropathy. Normal duodenum. Repeat upper endoscopy in 1 year for surveillance.   ESOPHAGOGASTRODUODENOSCOPY (EGD) WITH PROPOFOL  N/A 06/15/2023   Procedure: ESOPHAGOGASTRODUODENOSCOPY (EGD) WITH PROPOFOL ;  Surgeon: Shaaron Lamar HERO, MD;  Location: [facility];  Service: Endoscopy;  Laterality: N/A;  830am, asa 3   FIDUCIAL MARKER PLACEMENT  10/16/2022   Procedure: FIDUCIAL MARKER PLACEMENT;  Surgeon: Gladis Leonor HERO, MD;  Location: [facility] ENDOSCOPY;  Service: Pulmonary;;   INCISION / DRAINAGE HAND / FINGER     LEFT HEART CATH AND CORONARY ANGIOGRAPHY N/A 04/27/2017   Procedure: LEFT HEART CATH AND CORONARY ANGIOGRAPHY;  Surgeon: Mady Bruckner, MD;  Location: [facility] INVASIVE CV LAB;  Service: Cardiovascular;  Laterality: N/A;   LOOP RECORDER INSERTION  06/29/2017   Procedure: Loop Recorder Insertion;  Surgeon: Waddell Danelle ORN, MD;  Location: [facility] INVASIVE CV LAB;  Service: Cardiovascular;;   POLYPECTOMY  11/24/2016   Procedure: POLYPECTOMY;  Surgeon: Lamar HERO Shaaron, MD;  [facility];  Service: Endoscopy;;  descending and sigmoid   POLYPECTOMY  10/18/2021   Procedure: POLYPECTOMY;  Surgeon: Cindie Carlin POUR, DO;  Location: [facility];  Service: Endoscopy;;   TOTAL HIP ARTHROPLASTY Right 07/01/2016   TOTAL HIP ARTHROPLASTY Right  07/01/2016   Procedure: RIGHT TOTAL HIP ARTHROPLASTY ANTERIOR APPROACH;  Surgeon: Bruckner CINDERELLA Poli, MD;  Location: [facility] OR;  Service: Orthopedics;  Laterality: Right;   Family History Family History  Problem Relation Age of Onset   Heart attack Father    Stroke Father    Arthritis Father    Heart disease Father    Cancer Mother        ???   Arthritis Mother    Heart disease Brother 18       died in sleep   Early death Brother    Diabetes Maternal Uncle    Alzheimer's disease Maternal Grandmother     Social History Social History   Tobacco Use   Smoking status: Every Day    Current packs/day: 1.00    Average packs/day: 1 pack/day for 46.0 years (46.0 ttl pk-yrs)    Types: Cigarettes    Passive exposure: Never   Smokeless tobacco: Former    Types: Chew   Tobacco comments:    Smokes 1 pack a day of cigarettes (20 cig)10/16/23  Vaping Use   Vaping status: Former  Substance Use Topics   Alcohol  use: Yes    Comment: rare beer use   Drug use: No    Comment: history of drug use- marijuana, cocaine- quit 2014   Allergies Carvedilol , Lisinopril , Chantix  [varenicline ], and Amoxicillin   Review of Systems Review of Systems  All other systems reviewed and are negative.   Physical Exam Vital Signs  I have reviewed the  triage vital signs BP 132/77   Pulse 93   Temp 98.2 F (36.8 C) (Oral)   Resp 20   Ht 5' 5 (1.651 m)   Wt 87.9 kg   SpO2 93%   BMI 32.25 kg/m  Physical Exam Vitals and nursing note reviewed.  Constitutional:      General: He is not in acute distress.    Appearance: Normal appearance.  HENT:     Mouth/Throat:     Mouth: Mucous membranes are moist.  Eyes:     Conjunctiva/sclera: Conjunctivae normal.  Cardiovascular:     Rate and Rhythm: Normal rate and regular rhythm.  Pulmonary:     Effort: Pulmonary effort is normal. No respiratory distress.     Breath sounds: Normal breath sounds.  Abdominal:     General: Abdomen is flat.      Palpations: Abdomen is soft.     Tenderness: There is no abdominal tenderness.  Musculoskeletal:     Right lower leg: No edema.     Left lower leg: No edema.  Skin:    General: Skin is warm and dry.     Capillary Refill: Capillary refill takes less than 2 seconds.  Neurological:     Mental Status: He is alert and oriented to person, place, and time. Mental status is at baseline.     Comments: Cranial nerves II through XII intact, strength 5 out of 5 in the bilateral upper and lower extremities with except of left upper extremity pronator drift, no sensory deficit to light touch, no dysmetria on finger-nose-finger testing, ambulatory with steady gait.   Psychiatric:        Mood and Affect: Mood normal.        Behavior: Behavior normal.     ED Results and Treatments Labs (all labs ordered are listed, but only abnormal results are displayed) Labs Reviewed  CBC - Abnormal; Notable for the following components:      Result Value   WBC 13.0 (*)    Hemoglobin 17.8 (*)    HCT 52.1 (*)    All other components within normal limits  DIFFERENTIAL - Abnormal; Notable for the following components:   Neutro Abs 11.6 (*)    All other components within normal limits  COMPREHENSIVE METABOLIC PANEL WITH GFR - Abnormal; Notable for the following components:   Potassium 3.0 (*)    Glucose, Bld 134 (*)    Total Bilirubin 2.4 (*)    All other components within normal limits  GLUCOSE, CAPILLARY - Abnormal; Notable for the following components:   Glucose-Capillary 152 (*)    All other components within normal limits  CBG MONITORING, ED - Abnormal; Notable for the following components:   Glucose-Capillary 136 (*)    All other components within normal limits  CBG MONITORING, ED - Abnormal; Notable for the following components:   Glucose-Capillary 176 (*)    All other components within normal limits  PROTIME-INR  APTT  ETHANOL  LIPID PANEL  HIV ANTIBODY (ROUTINE TESTING W REFLEX)  Radiology CT ANGIO HEAD NECK W WO CM Result Date: 03/02/2024 CLINICAL DATA:  Stroke/TIA, determine embolic source EXAM: CT ANGIOGRAPHY HEAD AND NECK WITH AND WITHOUT CONTRAST TECHNIQUE: Multidetector CT imaging of the head and neck was performed using the standard protocol during bolus administration of intravenous contrast. Multiplanar CT image reconstructions and MIPs were obtained to evaluate the vascular anatomy. Carotid stenosis measurements (when applicable) are obtained utilizing NASCET criteria, using the distal internal carotid diameter as the denominator. RADIATION DOSE REDUCTION: This exam was performed according to the departmental dose-optimization program which includes automated exposure control, adjustment of the mA and/or kV according to patient size and/or use of iterative reconstruction technique. CONTRAST:  75mL OMNIPAQUE  IOHEXOL  350 MG/ML SOLN COMPARISON:  Same day MRI head. FINDINGS: CT HEAD FINDINGS Brain: Known acute infarct better characterized on recent MRI. No evidence of progressive mass effect or acute hemorrhage. No evidence of mass lesion, midline shift or hydrocephalus. Vascular: See below. Skull: No acute fracture. Sinuses/Orbits: Clear sinuses.  No acute orbital findings. Other: No mastoid effusions. Soft tissue thickening of the ears/pinna bilaterally with areas of calcification, which is chronic. Review of the MIP images confirms the above findings CTA NECK FINDINGS Aortic arch: Great vessel origins are patent without significant stenosis. Right-sided aortic arch. Aortic atherosclerosis. Right carotid system: No evidence of dissection, stenosis (50% or greater), or occlusion. Left carotid system: No evidence of dissection, stenosis (50% or greater), or occlusion. Vertebral arteries: Left dominant. No evidence of dissection, stenosis (50% or greater), or occlusion. Skeleton:  Severe multilevel degenerative change. Other neck: No evidence of acute abnormality on limited assessment. Upper chest: Visualized lung apices are clear. Review of the MIP images confirms the above findings CTA HEAD FINDINGS Anterior circulation: Bilateral intracranial ICAs, MCAs, and ACAs are patent without proximal hemodynamically significant stenosis. Posterior circulation: Bilateral intradural vertebral arteries, basilar artery and bilateral posterior cerebral arteries are patent without proximal hemodynamically significant stenosis. Venous sinuses: As permitted by contrast timing, patent. Review of the MIP images confirms the above findings IMPRESSION: 1. No emergent large vessel occlusion or proximal hemodynamically significant stenosis. 2. Right-sided aortic arch and aortic atherosclerosis (ICD10-I70.0). Electronically Signed   By: Gilmore GORMAN Molt M.D.   On: 03/02/2024 22:36   MR Brain Wo Contrast Result Date: 03/02/2024 CLINICAL DATA:  Neuro deficit, acute, stroke suspected left sided weakness, gait instability, dizziness, nausea (started Sunday night) EXAM: MRI HEAD WITHOUT CONTRAST TECHNIQUE: Multiplanar, multiecho pulse sequences of the brain and surrounding structures were obtained without intravenous contrast. COMPARISON:  MRI of the head dated May 07, 2023. FINDINGS: Brain: There is an acute nonhemorrhagic infarct present at the right thalamus capsular junction seen on image 28 of series 5. There is also spurious high signal on the same image within the left frontal cortex, of no significance. There is age-related cerebral and cerebellar volume loss present. 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 There is no evidence of hemorrhage, mass or hydrocephalus. Vascular: Normal vascular flow voids. Skull and upper cervical spine: Normal marrow signal. No osseous lesions evident. Sinuses/Orbits: Negative. Other: None. IMPRESSION: 1. Acute/subacute lacunar infarct  will at the right thalamus capsular junction. 2. These results were called by telephone at the time of interpretation on 03/02/2024 at 6:51 pm to provider Dr. JACQULYN AHLE , who verbally acknowledged these results. Electronically Signed   By: Evalene Coho M.D.   On: 03/02/2024 18:52    Pertinent labs & imaging results that were available during my care of the patient  were reviewed by me and considered in my medical decision making (see MDM for details).  Medications Ordered in ED Medications   stroke: early stages of recovery book (has no administration in time range)  0.9 %  sodium chloride  infusion ( Intravenous Infusion Verify 03/02/24 2253)  acetaminophen  (TYLENOL ) tablet 650 mg (has no administration in time range)    Or  acetaminophen  (TYLENOL ) 160 MG/5ML solution 650 mg (has no administration in time range)    Or  acetaminophen  (TYLENOL ) suppository 650 mg (has no administration in time range)  enoxaparin  (LOVENOX ) injection 45 mg (45 mg Subcutaneous Given 03/02/24 2144)  magnesium  hydroxide (MILK OF MAGNESIA) suspension 30 mL (has no administration in time range)  traZODone  (DESYREL ) tablet 25 mg (has no administration in time range)  insulin  aspart (novoLOG ) injection 0-9 Units (has no administration in time range)  insulin  aspart (novoLOG ) injection 0-5 Units ( Subcutaneous Not Given 03/02/24 2147)  aspirin  EC tablet 81 mg (has no administration in time range)  amLODipine  (NORVASC ) tablet 10 mg (has no administration in time range)  atorvastatin  (LIPITOR ) tablet 40 mg (has no administration in time range)  ezetimibe  (ZETIA ) tablet 10 mg (has no administration in time range)  nitroGLYCERIN  (NITROSTAT ) SL tablet 0.4 mg (has no administration in time range)  citalopram  (CELEXA ) tablet 20 mg (has no administration in time range)  dapagliflozin  propanediol (FARXIGA ) tablet 10 mg (has no administration in time range)  pantoprazole  (PROTONIX ) EC tablet 40 mg (40 mg Oral Given 03/02/24 2240)   ascorbic acid  (VITAMIN C) tablet 500 mg (has no administration in time range)  cholecalciferol (VITAMIN D3) 25 MCG (1000 UNIT) tablet 5,000 Units (has no administration in time range)  cycloSPORINE  (RESTASIS ) 0.05 % ophthalmic emulsion 1 drop (1 drop Both Eyes Given 03/02/24 2240)  magnesium  oxide (MAG-OX) tablet 400 mg (400 mg Oral Given 03/02/24 2240)  acetaminophen  (TYLENOL ) tablet 500 mg (500 mg Oral Given 03/02/24 2054)  iohexol  (OMNIPAQUE ) 350 MG/ML injection 75 mL (75 mLs Intravenous Contrast Given 03/02/24 2130)                                                                                                                                     Procedures Procedures  (including critical care time)  Medical Decision Making / ED Course   MDM:  58 year old presenting to the emergency department with abnormal MRI.  Patient overall well-appearing, physical examination shows signs of stroke with left-sided pronator drift but otherwise reassuring exam.  Reviewed MRI which does show a right-sided stroke which correlate with the symptoms.  Patient's symptoms are overall improving, he is far outside window for TNK, but given acute stroke should be admitted for further stroke workup and evaluation.  Discussed with hospitalist Dr. Georgian who has admitted the patient.      Additional history obtained:  -External records from outside source obtained and reviewed including: Chart review including previous notes, labs, imaging, consultation  notes including PMD note and MRI   Lab Tests: -I ordered, reviewed, and interpreted labs.   The pertinent results include:   Labs Reviewed  CBC - Abnormal; Notable for the following components:      Result Value   WBC 13.0 (*)    Hemoglobin 17.8 (*)    HCT 52.1 (*)    All other components within normal limits  DIFFERENTIAL - Abnormal; Notable for the following components:   Neutro Abs 11.6 (*)    All other components within normal limits  COMPREHENSIVE  METABOLIC PANEL WITH GFR - Abnormal; Notable for the following components:   Potassium 3.0 (*)    Glucose, Bld 134 (*)    Total Bilirubin 2.4 (*)    All other components within normal limits  GLUCOSE, CAPILLARY - Abnormal; Notable for the following components:   Glucose-Capillary 152 (*)    All other components within normal limits  CBG MONITORING, ED - Abnormal; Notable for the following components:   Glucose-Capillary 136 (*)    All other components within normal limits  CBG MONITORING, ED - Abnormal; Notable for the following components:   Glucose-Capillary 176 (*)    All other components within normal limits  PROTIME-INR  APTT  ETHANOL  LIPID PANEL  HIV ANTIBODY (ROUTINE TESTING W REFLEX)    Notable for mild leukocytosis and hemoconcetration  EKG   EKG Interpretation Date/Time:    Ventricular Rate:    PR Interval:    QRS Duration:    QT Interval:    QTC Calculation:   R Axis:      Text Interpretation:            Medicines ordered and prescription drug management: Meds ordered this encounter  Medications   acetaminophen  (TYLENOL ) tablet 500 mg    stroke: early stages of recovery book   0.9 %  sodium chloride  infusion   OR Linked Order Group    acetaminophen  (TYLENOL ) tablet 650 mg    acetaminophen  (TYLENOL ) 160 MG/5ML solution 650 mg    acetaminophen  (TYLENOL ) suppository 650 mg   enoxaparin  (LOVENOX ) injection 45 mg   magnesium  hydroxide (MILK OF MAGNESIA) suspension 30 mL   traZODone  (DESYREL ) tablet 25 mg   iohexol  (OMNIPAQUE ) 350 MG/ML injection 75 mL   insulin  aspart (novoLOG ) injection 0-9 Units    Correction coverage::   Sensitive (thin, NPO, renal)    CBG < 70::   Implement Hypoglycemia Standing Orders and refer to Hypoglycemia Standing Orders sidebar report    CBG 70 - 120::   0 units    CBG 121 - 150::   1 unit    CBG 151 - 200::   2 units    CBG 201 - 250::   3 units    CBG 251 - 300::   5 units    CBG 301 - 350::   7 units    CBG 351 -  400:   9 units    CBG > 400:   call MD and obtain STAT lab verification   insulin  aspart (novoLOG ) injection 0-5 Units    Correction coverage::   HS scale    CBG < 70::   Implement Hypoglycemia Standing Orders and refer to Hypoglycemia Standing Orders sidebar report    CBG 70 - 120::   0 units    CBG 121 - 150::   0 units    CBG 151 - 200::   0 units    CBG 201 - 250::   2  units    CBG 251 - 300::   3 units    CBG 301 - 350::   4 units    CBG 351 - 400::   5 units    CBG > 400:   call MD and obtain STAT lab verification   aspirin  EC tablet 81 mg    Swallow whole.     amLODipine  (NORVASC ) tablet 10 mg   atorvastatin  (LIPITOR ) tablet 40 mg   ezetimibe  (ZETIA ) tablet 10 mg   nitroGLYCERIN  (NITROSTAT ) SL tablet 0.4 mg   citalopram  (CELEXA ) tablet 20 mg   dapagliflozin  propanediol (FARXIGA ) tablet 10 mg   pantoprazole  (PROTONIX ) EC tablet 40 mg   ascorbic acid  (VITAMIN C) tablet 500 mg   cholecalciferol (VITAMIN D3) 25 MCG (1000 UNIT) tablet 5,000 Units   DISCONTD: magnesium  gluconate (MAGONATE) tablet 500 mg   cycloSPORINE  (RESTASIS ) 0.05 % ophthalmic emulsion 1 drop   magnesium  oxide (MAG-OX) tablet 400 mg    -I have reviewed the patients home medicines and have made adjustments as needed   Consultations Obtained: I requested consultation with the hospitalist,  and discussed lab and imaging findings as well as pertinent plan - they recommend: admission    Social Determinants of Health:  Diagnosis or treatment significantly limited by social determinants of health: obesity   Reevaluation: After the interventions noted above, I reevaluated the patient and found that their symptoms have improved  Co morbidities that complicate the patient evaluation  Past Medical History:  Diagnosis Date   ACE inhibitor-aggravated angioedema    Allergy     Anemia    Angio-edema    Anxiety    Arthritis    Asthma    hip replacement   Back pain    Bradycardia 04/28/2017   Bulging of  cervical intervertebral disc    CAD (coronary artery disease)    lateral STEMI 02/06/2015 00% D1 occlusion treated with Promus Premier 2.5 mm x 16 mm DES, 70% ramus stenosis, 40% mid RCA stenosis, 45% distal RCA stenosis, EF 45-50%   CHF (congestive heart failure) (HCC)    COPD (chronic obstructive pulmonary disease) (HCC)    Depression    Diabetes mellitus without complication (HCC)    Difficult intubation    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   Dry eye    Dyspnea    Early satiety 09/23/2016   Fatty liver    GERD (gastroesophageal reflux disease)    HCAP (healthcare-associated pneumonia) 05/15/2017   Headache    Heart murmur    Hip pain    Hyperlipidemia    Hypertension    Lobar pneumonia (HCC) 05/15/2017   Melena 08/04/2018   MI (myocardial infarction) (HCC)    Myocardial infarction (HCC)    Neck pain    Non-ST elevation (NSTEMI) myocardial infarction (HCC) 04/27/2017   NSTEMI (non-ST elevated myocardial infarction) (HCC) 04/26/2017   Otitis media    Pleurisy    Pneumonia due to COVID-19 virus 10/07/2019   Rectal bleeding 11/08/2018   Right shoulder pain 03/20/2020   Sinus pause    9 sec sinus pause on telemetry after started on coreg  after MI, avoid AV nodal blocking agent   Sleep apnea    uses a cpap   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    Final Clinical Impression(s) / ED Diagnoses Final diagnoses:  Acute stroke due to ischemia [facility])     This chart was dictated using voice recognition software.  Despite best efforts to proofread,  errors can occur which can  change the documentation meaning.    Francesca Elsie CROME, MD 03/03/24 STANLY

## 2024-03-02 NOTE — ED Notes (Signed)
 Patient transported to CT

## 2024-03-02 NOTE — Assessment & Plan Note (Signed)
-   Will continue Farxiga . - Will hold off metformin .

## 2024-03-02 NOTE — Assessment & Plan Note (Signed)
-   Will continue Farxiga .

## 2024-03-02 NOTE — Assessment & Plan Note (Addendum)
-   We will resume CPAP nightly.

## 2024-03-02 NOTE — Plan of Care (Signed)
  Problem: Activity: Goal: Risk for activity intolerance will decrease Outcome: Progressing   Problem: Nutrition: Goal: Adequate nutrition will be maintained Outcome: Progressing   Problem: Coping: Goal: Level of anxiety will decrease Outcome: Progressing   Problem: Coping: Goal: Ability to adjust to condition or change in health will improve Outcome: Progressing   Problem: Fluid Volume: Goal: Ability to maintain a balanced intake and output will improve Outcome: Progressing   Problem: Health Behavior/Discharge Planning: Goal: Ability to manage health-related needs will improve Outcome: Progressing   Problem: Metabolic: Goal: Ability to maintain appropriate glucose levels will improve Outcome: Progressing   Problem: Nutritional: Goal: Maintenance of adequate nutrition will improve Outcome: Progressing Goal: Progress toward achieving an optimal weight will improve Outcome: Progressing   Problem: Skin Integrity: Goal: Risk for impaired skin integrity will decrease Outcome: Progressing   Problem: Tissue Perfusion: Goal: Adequacy of tissue perfusion will improve Outcome: Progressing

## 2024-03-02 NOTE — Assessment & Plan Note (Signed)
Will continue Celexa

## 2024-03-02 NOTE — Assessment & Plan Note (Addendum)
 Will continue statin therapy and Zetia.

## 2024-03-03 ENCOUNTER — Observation Stay (HOSPITAL_COMMUNITY)

## 2024-03-03 ENCOUNTER — Other Ambulatory Visit (HOSPITAL_COMMUNITY): Payer: Self-pay | Admitting: *Deleted

## 2024-03-03 DIAGNOSIS — I6389 Other cerebral infarction: Secondary | ICD-10-CM

## 2024-03-03 DIAGNOSIS — F17210 Nicotine dependence, cigarettes, uncomplicated: Secondary | ICD-10-CM | POA: Diagnosis not present

## 2024-03-03 DIAGNOSIS — I639 Cerebral infarction, unspecified: Secondary | ICD-10-CM

## 2024-03-03 DIAGNOSIS — R29700 NIHSS score 0: Secondary | ICD-10-CM | POA: Diagnosis not present

## 2024-03-03 DIAGNOSIS — E876 Hypokalemia: Secondary | ICD-10-CM

## 2024-03-03 LAB — ECHOCARDIOGRAM COMPLETE BUBBLE STUDY
Area-P 1/2: 3.77 cm2
S' Lateral: 3.6 cm

## 2024-03-03 LAB — LIPID PANEL
Cholesterol: 93 mg/dL (ref 0–200)
HDL: 24 mg/dL — ABNORMAL LOW (ref >40)
LDL Cholesterol: 46 mg/dL (ref 0–99)
Total CHOL/HDL Ratio: 3.9 ratio
Triglycerides: 115 mg/dL (ref <150)
VLDL: 23 mg/dL (ref 0–40)

## 2024-03-03 LAB — GLUCOSE, CAPILLARY
Glucose-Capillary: 121 mg/dL — ABNORMAL HIGH (ref 70–99)
Glucose-Capillary: 137 mg/dL — ABNORMAL HIGH (ref 70–99)
Glucose-Capillary: 126 mg/dL — ABNORMAL HIGH (ref 70–99)

## 2024-03-03 LAB — HIV ANTIBODY (ROUTINE TESTING W REFLEX): HIV Screen 4th Generation wRfx: NONREACTIVE

## 2024-03-03 MED ORDER — ASPIRIN 81 MG PO TBEC: 81.0000 mg | DELAYED_RELEASE_TABLET | Freq: Every day | ORAL | 12 refills | Status: AC

## 2024-03-03 MED ORDER — CLOPIDOGREL BISULFATE 75 MG PO TABS: 75.0000 mg | ORAL_TABLET | Freq: Every day | ORAL | 0 refills | Status: DC

## 2024-03-03 MED ORDER — PERFLUTREN LIPID MICROSPHERE
1.0000 mL | INTRAVENOUS | Status: AC | PRN
  Administered 2024-03-03: 3 mL via INTRAVENOUS

## 2024-03-03 MED ORDER — ENOXAPARIN SODIUM 40 MG/0.4ML IJ SOSY: 40.0000 mg | PREFILLED_SYRINGE | INTRAMUSCULAR | Status: DC

## 2024-03-03 MED ORDER — CLOPIDOGREL BISULFATE 75 MG PO TABS
75.0000 mg | ORAL_TABLET | Freq: Every day | ORAL | Status: DC
  Administered 2024-03-03: 75 mg via ORAL
  Filled 2024-03-03: qty 1

## 2024-03-03 NOTE — Evaluation (Signed)
 Physical Therapy Evaluation Patient Details Name: Randy Hebert MRN: [id number] DOB: 23-Jan-1966 Today's Date: 03/03/2024  History of Present Illness  Randy Hebert is a 58 y.o. male with medical history significant for anxiety, asthma, back pain, coronary artery disease, CHF, COPD, depression type 2 diabetes mellitus, hypertension dyslipidemia and OSA, on CPAP nightly, who presented to the emergency room with acute onset of recent acute CVA with left-sided weakness that has improved since my then the patient had an outpatient MRI that confirmed his CVA.  He has been having difficulty with his gait and balance.  He was called by his PCP to confirm the MRI results.  He admits to headache without dizziness or blurry vision or diplopia.  No tinnitus or vertigo.  No urinary or stool incontinence.  No nausea or vomiting or abdominal pain.  No chest pain or palpitations.  No cough or wheezing or dyspnea.  No dysuria, oliguria or hematuria or flank pain.   Clinical Impression  Patient functioning near baseline for functional mobility and gait other than having baseline decreased ankle dorsiflexion strength with mild slap foot, other than that demonstrates good return for ambulating in room/hallways without loss of balance or need for an AD. Plan:  Patient discharged from physical therapy to care of nursing for ambulation daily as tolerated for length of stay.          If plan is discharge home, recommend the following: Other (comment) (at baseline)   Can travel by private vehicle        Equipment Recommendations None recommended by PT  Recommendations for Other Services       Functional Status Assessment Patient has not had a recent decline in their functional status     Precautions / Restrictions Precautions Precautions: None Recall of Precautions/Restrictions: Intact Restrictions Weight Bearing Restrictions Per Provider Order: No      Mobility  Bed Mobility Overal bed mobility:  Independent                  Transfers Overall transfer level: Independent                      Ambulation/Gait Ambulation/Gait assistance: Modified independent (Device/Increase time) Gait Distance (Feet): 150 Feet Assistive device: None Gait Pattern/deviations: Decreased dorsiflexion - right, WFL(Within Functional Limits) Gait velocity: slightly decreased     General Gait Details: grossly WFL with good return for ambulating in room, hallways without loss balance, had some baseline mild slap foot RLE due to chronic back problems per patient  Stairs            Wheelchair Mobility     Tilt Bed    Modified Rankin (Stroke Patients Only)       Balance Overall balance assessment: Mild deficits observed, not formally tested                                           Pertinent Vitals/Pain Pain Assessment Pain Assessment: No/denies pain    Home Living Family/patient expects to be discharged to:: Private residence Living Arrangements: Alone Available Help at Discharge: Family;Available PRN/intermittently;Neighbor Type of Home: Apartment Home Access: Elevator       Home Layout: One level Home Equipment: Cane - single point      Prior Function Prior Level of Function : Independent/Modified Independent  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 ADLs Comments: Independent; drives     Extremity/Trunk Assessment   Upper Extremity Assessment Upper Extremity Assessment: Defer to OT evaluation    Lower Extremity Assessment Lower Extremity Assessment: Overall WFL for tasks assessed    Cervical / Trunk Assessment Cervical / Trunk Assessment: Normal  Communication   Communication Communication: No apparent difficulties    Cognition Arousal: Alert Behavior During Therapy: WFL for tasks assessed/performed   PT - Cognitive impairments: No apparent impairments                          Following commands: Intact       Cueing Cueing Techniques: Verbal cues     General Comments      Exercises     Assessment/Plan    PT Assessment Patient does not need any further PT services  PT Problem List         PT Treatment Interventions      PT Goals (Current goals can be found in the Care Plan section)  Acute Rehab PT Goals Patient Stated Goal: return home PT Goal Formulation: With patient Time For Goal Achievement: 03/03/24 Potential to Achieve Goals: Good    Frequency       Co-evaluation PT/OT/SLP Co-Evaluation/Treatment: Yes Reason for Co-Treatment: To address functional/ADL transfers PT goals addressed during session: Mobility/safety with mobility;Balance OT goals addressed during session: ADL's and self-care       AM-PAC PT 6 Clicks Mobility  Outcome Measure Help needed turning from your back to your side while in a flat bed without using bedrails?: None Help needed moving from lying on your back to sitting on the side of a flat bed without using bedrails?: None Help needed moving to and from a bed to a chair (including a wheelchair)?: None Help needed standing up from a chair using your arms (e.g., wheelchair or bedside chair)?: None Help needed to walk in hospital room?: None Help needed climbing 3-5 steps with a railing? : None 6 Click Score: 24    End of Session   Activity Tolerance: Patient tolerated treatment well Patient left: in bed;with call bell/phone within reach Nurse Communication: Mobility status PT Visit Diagnosis: Unsteadiness on feet (R26.81);Other abnormalities of gait and mobility (R26.89);Muscle weakness (generalized) (M62.81)    Time: 9196-9176 PT Time Calculation (min) (ACUTE ONLY): 20 min   Charges:   PT Evaluation $PT Eval Moderate Complexity: 1 Mod PT Treatments $Therapeutic Activity: 8-22 mins PT General Charges $$ ACUTE PT VISIT: 1 Visit         12:24 PM, 03/03/24 Lynwood Music, MPT Physical Therapist  with [facility] [phone] office [phone] mobile phone

## 2024-03-03 NOTE — Progress Notes (Signed)
*  PRELIMINARY RESULTS* Echocardiogram 2D Echocardiogram has been performed with saline bubble study and Definity  study.  Teresa Aida PARAS 03/03/2024, 11:26 AM

## 2024-03-03 NOTE — Assessment & Plan Note (Signed)
-   Potassium will be replaced and magnesium level will be checked. ?

## 2024-03-03 NOTE — Consult Note (Signed)
 I connected with  Elsie CHRISTELLA Finder on 03/03/24 by a video enabled telemedicine application and verified that I am speaking with the correct person using two identifiers.   I discussed the limitations of evaluation and management by telemedicine. The patient expressed understanding and agreed to proceed.  Location of patient: [facility] Location of physician: [facility]  Neurology Consultation Reason for Consult: Stroke Referring Physician: Dr. Elsie Erie  CC: Left-sided weakness  History is obtained from: Patient, chart review  HPI: ELOISE MULA is a 58 y.o. male with past medical history of hypertension, hyperlipidemia, diabetes, coronary artery disease who reported having sudden onset of dizziness, nausea and left-sided weakness on Sunday 02/28/2024.  States that around 9 PM he got up and felt really dizzy and off-balance.  However he has angina so initially thought it might be because of that.  He went to sleep and the next morning noted that his left side was weak.  He called his primary care and was told to get an MRI brain.  MRI brain showed acute stroke therefore he was told to come to emergency room.  Of note patient is supposed to be on aspirin  81 mg daily but states he stopped taking it a while ago, denies any side effects.  Last known normal: 02/28/2024 around 9pm  Event happened at home No tPA as outside window No thrombectomy as no large vessel occlusion mRS 0   ROS: All other systems reviewed and negative except as noted in the HPI.   Past Medical History:  Diagnosis Date   ACE inhibitor-aggravated angioedema    Allergy     Anemia    Angio-edema    Anxiety    Arthritis    Asthma    hip replacement   Back pain    Bradycardia 04/28/2017   Bulging of cervical intervertebral disc    CAD (coronary artery disease)    lateral STEMI 02/06/2015 00% D1 occlusion treated with Promus Premier 2.5 mm x 16 mm DES, 70% ramus stenosis, 40% mid RCA stenosis,  45% distal RCA stenosis, EF 45-50%   CHF (congestive heart failure) (HCC)    COPD (chronic obstructive pulmonary disease) (HCC)    Depression    Diabetes mellitus without complication (HCC)    Difficult intubation    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   Dry eye    Dyspnea    Early satiety 09/23/2016   Fatty liver    GERD (gastroesophageal reflux disease)    HCAP (healthcare-associated pneumonia) 05/15/2017   Headache    Heart murmur    Hip pain    Hyperlipidemia    Hypertension    Lobar pneumonia (HCC) 05/15/2017   Melena 08/04/2018   MI (myocardial infarction) [facility])    Myocardial infarction (HCC)    Neck pain    Non-ST elevation (NSTEMI) myocardial infarction (HCC) 04/27/2017   NSTEMI (non-ST elevated myocardial infarction) (HCC) 04/26/2017   Otitis media    Pleurisy    Pneumonia due to COVID-19 virus 10/07/2019   Rectal bleeding 11/08/2018   Right shoulder pain 03/20/2020   Sinus pause    9 sec sinus pause on telemetry after started on coreg  after MI, avoid AV nodal blocking agent   Sleep apnea    uses a cpap   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   Arthritis Mother    Heart disease Brother 36       died in sleep   Early death Brother    Diabetes Maternal Uncle    Alzheimer's disease Maternal Grandmother     Social History:  reports that he has been smoking cigarettes. He has a 46 pack-year smoking history. He has never been exposed to tobacco smoke. He has quit using smokeless tobacco.  His smokeless tobacco use included chew. He  reports current alcohol  use. He reports that he does not use drugs.  Medications Prior to Admission  Medication Sig Dispense Refill Last Dose/Taking   acetaminophen  (TYLENOL ) 500 MG tablet Take 2 tablets (1,000 mg total) by mouth every 8 (eight) hours as needed for mild pain or fever.   03/02/2024 Morning   amLODipine  (NORVASC ) 10 MG tablet Take 1 tablet (10 mg total) by mouth daily. 90 tablet 1 03/02/2024 Morning   ascorbic acid  (VITAMIN C) 500 MG tablet Take 1 tablet (500 mg total) by mouth daily. 30 tablet 1 03/02/2024 Morning   aspirin  EC 81 MG tablet Take 81 mg by mouth 2 (two) times daily. Swallow whole.   03/02/2024 Morning   atorvastatin  (LIPITOR ) 40 MG tablet Take 1 tablet (40 mg total) by mouth daily. 100 tablet 3 03/02/2024 Noon   Cholecalciferol (VITAMIN D3) 125 MCG (5000 UT) CAPS Take 1 capsule (5,000 Units total) by mouth daily. 90 capsule 0 03/02/2024 Morning   citalopram  (CELEXA ) 20 MG tablet Take 1 tablet (20 mg total) by mouth daily. 90 tablet 1 03/02/2024 Morning   dapagliflozin  propanediol (FARXIGA ) 10 MG TABS tablet Take 1 tablet (10 mg total) by mouth daily. 90 tablet 0 03/02/2024 Morning   ezetimibe  (ZETIA ) 10 MG tablet Take 1 tablet (10 mg total) by mouth daily. 90 tablet 1 03/02/2024 Morning   ferrous sulfate  325 (65 FE) MG tablet Take 1 tablet (325 mg total) by mouth daily with breakfast. 90 tablet 1 03/02/2024 Morning   magnesium  gluconate (MAGONATE) 500 MG tablet Take 500 mg by mouth 2 (two) times daily.   03/02/2024 Morning   metFORMIN  (GLUCOPHAGE ) 500 MG tablet Take 1 tablet (500 mg total) by mouth 2 (two) times daily with a meal. 180 tablet 0 03/02/2024 Morning   naproxen  (NAPROSYN ) 250 MG tablet Take 250 mg by mouth every 6 (six) hours as needed for mild pain (pain score 1-3).   03/02/2024 Noon   nitroGLYCERIN  (NITROSTAT ) 0.4 MG SL tablet Place 1 tablet (0.4 mg total) under the tongue every 5 (five) minutes x 3 doses as needed (if no relief after 3rd dose, proceed to ED or call 911). 25 tablet  3 Past Month   pantoprazole  (PROTONIX ) 40 MG tablet TAKE (1) TABLET BY MOUTH TWICE DAILY BEFORE A MEAL. 180 tablet 1 03/02/2024 Morning   RESTASIS  0.05 % ophthalmic emulsion Place 1 drop into both eyes 2 (two) times daily.   03/02/2024 Morning   SYMBICORT  160-4.5 MCG/ACT inhaler INHALE 2 PUFFS INTO THE LUNGS FIRST THING IN THE MORNING AND 2 PUFFS ABOUT 12 HOURS LATER. 10.2 g 12 03/02/2024 Morning   TRULICITY  1.5 MG/0.5ML SOAJ Inject 1.5 mg into the  skin once a week. 2 mL 1 02/29/2024   Blood Glucose Monitoring Suppl (ACCU-CHEK GUIDE ME) w/Device KIT 1 Piece by Does not apply route as directed. 1 kit 0    glucose blood (ACCU-CHEK GUIDE TEST) test strip Use to monitor glucose 2 times daily as instructed 100 strip 2       Exam: Current vital signs: BP 126/85   Pulse 81   Temp 97.8 F (36.6 C)   Resp 20   Ht 5' 5 (1.651 m)   Wt 87.9 kg   SpO2 93%   BMI 32.25 kg/m  Vital signs in last 24 hours: Temp:  [97.8 F (36.6 C)-98.8 F (37.1 C)] 97.8 F (36.6 C) (07/03 0527) Pulse Rate:  [81-108] 81 (07/03 0527) Resp:  [18-30] 20 (07/03 0527) BP: (114-155)/(66-85) 126/85 (07/03 0832) SpO2:  [85 %-94 %] 93 % (07/03 0527) Weight:  [87.9 kg-88.5 kg] 87.9 kg (07/02 2200)   Physical Exam  Constitutional: Appears well-developed and well-nourished.  Psych: Affect appropriate to situation Neuro: AO x 3, no aphasia or dysarthria, cranial nerves grossly intact, antigravity strength without drift in all 4 extremities, sensory intact to light touch, FTN intact bilaterally  NIHSS 0   I have reviewed labs in epic and the results pertinent to this consultation are: CBC:  Recent Labs  Lab 03/02/24 1920  WBC 13.0*  NEUTROABS 11.6*  HGB 17.8*  HCT 52.1*  MCV 90.8  PLT 166    Basic Metabolic Panel:  Lab Results  Component Value Date   NA 135 03/02/2024   K 3.0 (L) 03/02/2024   CO2 23 03/02/2024   GLUCOSE 134 (H) 03/02/2024   BUN 10 03/02/2024   CREATININE 0.70 03/02/2024   CALCIUM  9.3  03/02/2024   GFRNONAA >60 03/02/2024   GFRAA 127 10/24/2020   Lipid Panel:  Lab Results  Component Value Date   LDLCALC 46 03/03/2024   HgbA1c:  Lab Results  Component Value Date   HGBA1C 6.7 02/02/2024   Urine Drug Screen:     Component Value Date/Time   LABOPIA NONE DETECTED 05/15/2017 0808   COCAINSCRNUR NONE DETECTED 05/15/2017 0808   LABBENZ NONE DETECTED 05/15/2017 0808   AMPHETMU NONE DETECTED 05/15/2017 0808   THCU NONE DETECTED 05/15/2017 0808   LABBARB NONE DETECTED 05/15/2017 0808    Alcohol  Level     Component Value Date/Time   ETH <15 03/02/2024 1920     I have reviewed the images obtained:  CT Head without contrast 03/02/2024: Known acute infarct better characterized on recent MRI. No evidence of progressive mass effect or acute hemorrhage. No evidence of mass lesion, midline shift or hydrocephalus.  CT angio Head and Neck with contrast 03/02/2024 :  No emergent large vessel occlusion or proximal hemodynamically significant stenosis. Right-sided aortic arch and aortic atherosclerosis  MRI Brain wo contrast 03/02/2024: Acute/subacute lacunar infarct will at the right thalamus capsular junction.     ASSESSMENT/PLAN: 58 year old male with transient left-sided weakness and dizziness which is since resolved.  Acute ischemic stroke Etiology: Likely embolic versus small vessel disease  Recommendations: - Recommend aspirin  81 mg daily and Plavix  75 mg daily for 3 weeks followed by aspirin  81 mg daily -LDL 46, continue current management - TTE negative for thrombus and PFO.  Recommend cardiac monitor to look for paroxysmal A-fib - PT/OT - Stroke education Goal blood pressure: Normotension -Patient continues to smoke half PPD.  Nicotine  cessation counseling done - Follow-up with neurology in 2 to 3 months (order placed) -  Discussed plan with Dr. Maree via secure chat   Thank you for allowing us  to participate in the care of this patient. If you have any further  questions, please contact  me or neurohospitalist.   Arlin Krebs [facility] neurohospitalist

## 2024-03-03 NOTE — Progress Notes (Signed)
   03/03/24 1249  TOC Brief Assessment  Insurance and Status Reviewed  Patient has primary care physician Yes  Home environment has been reviewed Home  Prior level of function: independent  Prior/Current Home Services No current home services  Social Drivers of Health Review SDOH reviewed no interventions necessary  Readmission risk has been reviewed Yes  Transition of care needs no transition of care needs at this time   PT eval completed, no follow up needed.   Transition of Care Department [facility]) has reviewed patient and no TOC needs have been identified at this time. We will continue to monitor patient advancement through interdisciplinary progression rounds. If new patient transition needs arise, please place a TOC consult

## 2024-03-03 NOTE — Discharge Summary (Signed)
 Physician Discharge Summary  Randy Hebert FMW:[id number] DOB: 03-27-1966 DOA: 03/02/2024  PCP: Bluford Jacqulyn MATSU, DO  Admit date: 03/02/2024  Discharge date: 03/03/2024  Admitted From:Home  Disposition:  Home  Recommendations for Outpatient Follow-up:  Follow up with PCP in 1-2 weeks Follow-up with Dr. Alvan will be set up in the near future Follow-up with neurology in 2-3 months with referral sent Remain on aspirin  and Plavix  as prescribed for 3 weeks and then aspirin  only thereafter Remain on statin as prior Cardiac monitor for 30 days will be arranged Counseled on smoking cessation  Home Health: None  Equipment/Devices: None  Discharge Condition:Stable  CODE STATUS: Full  Diet recommendation: Heart Healthy/carb modified  Brief/Interim Summary: Randy Hebert is a 58 y.o. male with medical history significant for anxiety, asthma, back pain, coronary artery disease, CHF, COPD, depression type 2 diabetes mellitus, hypertension dyslipidemia and OSA, on CPAP nightly, who presented to the emergency room with acute onset of recent acute CVA with left-sided weakness.  He was noted to have signs of CVA on outpatient MRI which was performed 7/2 and was seen by his PCP 7/1.  His symptoms actually started on the evening of 6/29.  He was seen by neurology with recommendations to remain on aspirin  and Plavix  for 3 weeks and then aspirin  only thereafter.  2D echocardiogram with no concerning findings, however there is some question of wall motion abnormalities noted.  He will follow-up with cardiology outpatient and will receive 30-day event monitor.  No further recommendations from PT standpoint and he is overall stable for discharge.  Discharge Diagnoses:  Principal Problem:   Acute CVA (cerebrovascular accident) [facility]) Active Problems:   Essential hypertension   Depression   Hypokalemia   Dyslipidemia   Type 2 diabetes mellitus without complications (HCC)   OSA (obstructive sleep  apnea)   Chronic combined systolic and diastolic CHF (congestive heart failure) (HCC)  Principal discharge diagnosis: Acute ischemic CVA with left-sided hemiparesis and dizziness-improved.  Discharge Instructions  Discharge Instructions     Ambulatory referral to Neurology   Complete by: As directed    An appointment is requested in approximately: 4-6 weeks   Diet - low sodium heart healthy   Complete by: As directed    Increase activity slowly   Complete by: As directed       Allergies as of 03/03/2024       Reactions   Carvedilol  Other (See Comments)   Sinus pause on telemetry >3 seconds. Longest one 9 sec. No AV nodal agent   Lisinopril  Anaphylaxis, Shortness Of Breath, Swelling   Angioedema, required intubation and mechanical ventilation   Chantix  [varenicline ] Other (See Comments)   Nightmares and unable to sleep well   Amoxicillin  Nausea Only   Childhood reaction        Medication List     TAKE these medications    Accu-Chek Guide Me w/Device Kit 1 Piece by Does not apply route as directed.   Accu-Chek Guide Test test strip Generic drug: glucose blood Use to monitor glucose 2 times daily as instructed   acetaminophen  500 MG tablet Commonly known as: TYLENOL  Take 2 tablets (1,000 mg total) by mouth every 8 (eight) hours as needed for mild pain or fever.   amLODipine  10 MG tablet Commonly known as: NORVASC  Take 1 tablet (10 mg total) by mouth daily.   ascorbic acid  500 MG tablet Commonly known as: VITAMIN C Take 1 tablet (500 mg total) by mouth daily.   aspirin   EC 81 MG tablet Take 81 mg by mouth 2 (two) times daily. Swallow whole. What changed: Another medication with the same name was added. Make sure you understand how and when to take each.   aspirin  EC 81 MG tablet Take 1 tablet (81 mg total) by mouth daily. Swallow whole. What changed: You were already taking a medication with the same name, and this prescription was added. Make sure you  understand how and when to take each.   atorvastatin  40 MG tablet Commonly known as: LIPITOR  Take 1 tablet (40 mg total) by mouth daily.   citalopram  20 MG tablet Commonly known as: CELEXA  Take 1 tablet (20 mg total) by mouth daily.   clopidogrel  75 MG tablet Commonly known as: PLAVIX  Take 1 tablet (75 mg total) by mouth daily for 21 days. Start taking on: March 04, 2024   dapagliflozin  propanediol 10 MG Tabs tablet Commonly known as: Farxiga  Take 1 tablet (10 mg total) by mouth daily.   ezetimibe  10 MG tablet Commonly known as: Zetia  Take 1 tablet (10 mg total) by mouth daily.   ferrous sulfate  325 (65 FE) MG tablet Take 1 tablet (325 mg total) by mouth daily with breakfast.   magnesium  gluconate 500 MG tablet Commonly known as: MAGONATE Take 500 mg by mouth 2 (two) times daily.   metFORMIN  500 MG tablet Commonly known as: GLUCOPHAGE  Take 1 tablet (500 mg total) by mouth 2 (two) times daily with a meal.   naproxen  250 MG tablet Commonly known as: NAPROSYN  Take 250 mg by mouth every 6 (six) hours as needed for mild pain (pain score 1-3).   nitroGLYCERIN  0.4 MG SL tablet Commonly known as: NITROSTAT  Place 1 tablet (0.4 mg total) under the tongue every 5 (five) minutes x 3 doses as needed (if no relief after 3rd dose, proceed to ED or call 911).   pantoprazole  40 MG tablet Commonly known as: PROTONIX  TAKE (1) TABLET BY MOUTH TWICE DAILY BEFORE A MEAL.   Restasis  0.05 % ophthalmic emulsion Generic drug: cycloSPORINE  Place 1 drop into both eyes 2 (two) times daily.   Symbicort  160-4.5 MCG/ACT inhaler Generic drug: budesonide -formoterol  INHALE 2 PUFFS INTO THE LUNGS FIRST THING IN THE MORNING AND 2 PUFFS ABOUT 12 HOURS LATER.   Trulicity  1.5 MG/0.5ML Soaj Generic drug: Dulaglutide  Inject 1.5 mg into the skin once a week.   Vitamin D3 125 MCG (5000 UT) Caps Take 1 capsule (5,000 Units total) by mouth daily.        Follow-up Information     Cook, Jayce G, DO.  Schedule an appointment as soon as possible for a visit in 1 week(s).   Specialty: Family Medicine Contact information: [address] [place] [address] [phone]         Alvan Dorn FALCON, MD. Go to.   Specialty: Cardiology Contact information: [address] [place] [address] [phone]         [facility]. Go in 2 month(s).   Specialty: Neurology Contact information: [address] [place]   [address] [phone]               Allergies  Allergen Reactions   Carvedilol  Other (See Comments)    Sinus pause on telemetry >3 seconds. Longest one 9 sec. No AV nodal agent   Lisinopril  Anaphylaxis, Shortness Of Breath and Swelling    Angioedema, required intubation and mechanical ventilation   Chantix  [Varenicline ] Other (See Comments)  Nightmares and unable to sleep well   Amoxicillin  Nausea Only    Childhood reaction     Consultations: Neurology   Procedures/Studies: ECHOCARDIOGRAM COMPLETE BUBBLE STUDY Result Date: 03/03/2024    ECHOCARDIOGRAM REPORT   Patient Name:   LUCILE DIDONATO Date of Exam: 03/03/2024 Medical Rec #:  [id number]        Height:       65.0 in Accession #:    [id number]       Weight:       193.8 lb Date of Birth:  1966/03/01        BSA:          1.952 m Patient Age:    58 years         BP:           126/85 mmHg Patient Gender: M                HR:           81 bpm. Exam Location:  [facility] Procedure: 2D Echo, Cardiac Doppler, Color Doppler, Saline Contrast Bubble Study            and Intracardiac Opacification Agent (Both Spectral and Color Flow            Doppler were utilized during procedure). Indications:    Stroke l63.9  History:        Patient has prior history of Echocardiogram examinations, most                 recent 08/05/2022. CHF, CAD and Previous Myocardial Infarction,                 COPD; Risk Factors:Hypertension, Diabetes, Dyslipidemia and                  Current Smoker.  Sonographer:    Aida Pizza RCS Referring Phys: [id number] JAN A MANSY IMPRESSIONS  1. Left ventricular ejection fraction, by estimation, is 45 to 50%. The left ventricle has mildly decreased function. The left ventricle demonstrates regional wall motion abnormalities (see scoring diagram/findings for description). Left ventricular diastolic parameters are indeterminate.  2. No LV mural thrombus noted with Definity  contrast.  3. Right ventricular systolic function is normal. The right ventricular size is normal. Tricuspid regurgitation signal is inadequate for assessing PA pressure.  4. Left atrial size was mildly dilated.  5. The mitral valve is degenerative. Trivial mitral valve regurgitation.  6. The aortic valve is tricuspid. There is mild calcification of the aortic valve. Aortic valve regurgitation is not visualized. Aortic valve sclerosis is present, with no evidence of aortic valve stenosis.  7. The inferior vena cava is normal in size with greater than 50% respiratory variability, suggesting right atrial pressure of 3 mmHg.  8. Agitated saline contrast bubble study was negative, with no evidence of any interatrial shunt. Comparison(s): Prior images reviewed side by side. LVEF is relatively stable on review of the last few studies. FINDINGS  Left Ventricle: Left ventricular ejection fraction, by estimation, is 45 to 50%. The left ventricle has mildly decreased function. The left ventricle demonstrates regional wall motion abnormalities. Definity  contrast agent was given IV to delineate the left ventricular endocardial borders. The left ventricular internal cavity size was normal in size. There is no left ventricular hypertrophy. Left ventricular diastolic parameters are indeterminate.  LV Wall Scoring: The antero-lateral wall, inferior wall, and posterior wall are hypokinetic. The entire anterior wall, entire septum, and entire apex are normal. Right  Ventricle: The right ventricular size is  normal. No increase in right ventricular wall thickness. Right ventricular systolic function is normal. Tricuspid regurgitation signal is inadequate for assessing PA pressure. Left Atrium: Left atrial size was mildly dilated. Right Atrium: Right atrial size was normal in size. Pericardium: There is no evidence of pericardial effusion. Mitral Valve: The mitral valve is degenerative in appearance. Trivial mitral valve regurgitation. Tricuspid Valve: The tricuspid valve is grossly normal. Tricuspid valve regurgitation is trivial. Aortic Valve: The aortic valve is tricuspid. There is mild calcification of the aortic valve. Aortic valve regurgitation is not visualized. Aortic valve sclerosis is present, with no evidence of aortic valve stenosis. Pulmonic Valve: The pulmonic valve was grossly normal. Pulmonic valve regurgitation is trivial. Aorta: The aortic root is normal in size and structure. Venous: The inferior vena cava is normal in size with greater than 50% respiratory variability, suggesting right atrial pressure of 3 mmHg. IAS/Shunts: No atrial level shunt detected by color flow Doppler. Agitated saline contrast was given intravenously to evaluate for intracardiac shunting. Agitated saline contrast bubble study was negative, with no evidence of any interatrial shunt. Additional Comments: 3D was performed not requiring image post processing on an independent workstation and was indeterminate.  LEFT VENTRICLE PLAX 2D LVIDd:         4.80 cm   Diastology LVIDs:         3.60 cm   LV e' medial:    5.77 cm/s LV PW:         1.00 cm   LV E/e' medial:  12.2 LV IVS:        1.00 cm   LV e' lateral:   6.74 cm/s LVOT diam:     2.20 cm   LV E/e' lateral: 10.5 LV SV:         94 LV SV Index:   48 LVOT Area:     3.80 cm  RIGHT VENTRICLE RV S prime:     12.20 cm/s TAPSE (M-mode): 2.2 cm LEFT ATRIUM             Index        RIGHT ATRIUM           Index LA diam:        3.70 cm 1.90 cm/m   RA Area:     20.40 cm LA Vol (A2C):    45.2 ml 23.16 ml/m  RA Volume:   58.90 ml  30.18 ml/m LA Vol (A4C):   66.1 ml 33.87 ml/m LA Biplane Vol: 55.5 ml 28.44 ml/m  AORTIC VALVE LVOT Vmax:   130.00 cm/s LVOT Vmean:  85.100 cm/s LVOT VTI:    0.247 m  AORTA Ao Root diam: 3.70 cm MITRAL VALVE MV Area (PHT): 3.77 cm    SHUNTS MV Decel Time: 201 msec    Systemic VTI:  0.25 m MV E velocity: 70.60 cm/s  Systemic Diam: 2.20 cm MV A velocity: 76.50 cm/s MV E/A ratio:  0.92 Jayson Sierras MD Electronically signed by Jayson Sierras MD Signature Date/Time: 03/03/2024/11:52:58 AM    Final    CT ANGIO HEAD NECK W WO CM Result Date: 03/02/2024 CLINICAL DATA:  Stroke/TIA, determine embolic source EXAM: CT ANGIOGRAPHY HEAD AND NECK WITH AND WITHOUT CONTRAST TECHNIQUE: Multidetector CT imaging of the head and neck was performed using the standard protocol during bolus administration of intravenous contrast. Multiplanar CT image reconstructions and MIPs were obtained to evaluate the vascular anatomy. Carotid stenosis measurements (when applicable) are obtained utilizing  NASCET criteria, using the distal internal carotid diameter as the denominator. RADIATION DOSE REDUCTION: This exam was performed according to the departmental dose-optimization program which includes automated exposure control, adjustment of the mA and/or kV according to patient size and/or use of iterative reconstruction technique. CONTRAST:  75mL OMNIPAQUE  IOHEXOL  350 MG/ML SOLN COMPARISON:  Same day MRI head. FINDINGS: CT HEAD FINDINGS Brain: Known acute infarct better characterized on recent MRI. No evidence of progressive mass effect or acute hemorrhage. No evidence of mass lesion, midline shift or hydrocephalus. Vascular: See below. Skull: No acute fracture. Sinuses/Orbits: Clear sinuses.  No acute orbital findings. Other: No mastoid effusions. Soft tissue thickening of the ears/pinna bilaterally with areas of calcification, which is chronic. Review of the MIP images confirms the above findings  CTA NECK FINDINGS Aortic arch: Great vessel origins are patent without significant stenosis. Right-sided aortic arch. Aortic atherosclerosis. Right carotid system: No evidence of dissection, stenosis (50% or greater), or occlusion. Left carotid system: No evidence of dissection, stenosis (50% or greater), or occlusion. Vertebral arteries: Left dominant. No evidence of dissection, stenosis (50% or greater), or occlusion. Skeleton: Severe multilevel degenerative change. Other neck: No evidence of acute abnormality on limited assessment. Upper chest: Visualized lung apices are clear. Review of the MIP images confirms the above findings CTA HEAD FINDINGS Anterior circulation: Bilateral intracranial ICAs, MCAs, and ACAs are patent without proximal hemodynamically significant stenosis. Posterior circulation: Bilateral intradural vertebral arteries, basilar artery and bilateral posterior cerebral arteries are patent without proximal hemodynamically significant stenosis. Venous sinuses: As permitted by contrast timing, patent. Review of the MIP images confirms the above findings IMPRESSION: 1. No emergent large vessel occlusion or proximal hemodynamically significant stenosis. 2. Right-sided aortic arch and aortic atherosclerosis (ICD10-I70.0). Electronically Signed   By: Gilmore GORMAN Molt M.D.   On: 03/02/2024 22:36   MR Brain Wo Contrast Result Date: 03/02/2024 CLINICAL DATA:  Neuro deficit, acute, stroke suspected left sided weakness, gait instability, dizziness, nausea (started Sunday night) EXAM: MRI HEAD WITHOUT CONTRAST TECHNIQUE: Multiplanar, multiecho pulse sequences of the brain and surrounding structures were obtained without intravenous contrast. COMPARISON:  MRI of the head dated May 07, 2023. FINDINGS: Brain: There is an acute nonhemorrhagic infarct present at the right thalamus capsular junction seen on image 28 of series 5. There is also spurious high signal on the same image within the left frontal  cortex, of no significance. There is age-related cerebral and cerebellar volume loss present. 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 There is no evidence of hemorrhage, mass or hydrocephalus. Vascular: Normal vascular flow voids. Skull and upper cervical spine: Normal marrow signal. No osseous lesions evident. Sinuses/Orbits: Negative. Other: None. IMPRESSION: 1. Acute/subacute lacunar infarct will at the right thalamus capsular junction. 2. These results were called by telephone at the time of interpretation on 03/02/2024 at 6:51 pm to provider Dr. JACQULYN AHLE , who verbally acknowledged these results. Electronically Signed   By: Evalene Coho M.D.   On: 03/02/2024 18:52     Discharge Exam: Vitals:   03/03/24 0832 03/03/24 1250  BP: 126/85 (!) 141/87  Pulse:  92  Resp:    Temp:  98.1 F (36.7 C)  SpO2:  94%   Vitals:   03/02/24 2300 03/03/24 0527 03/03/24 0832 03/03/24 1250  BP:  129/75 126/85 (!) 141/87  Pulse: 93 81  92  Resp: 20 20    Temp:  97.8 F (36.6 C)  98.1 F (36.7 C)  TempSrc:    Oral  SpO2: 93% 93%  94%  Weight:      Height:        General: Pt is alert, awake, not in acute distress Cardiovascular: RRR, S1/S2 +, no rubs, no gallops Respiratory: CTA bilaterally, no wheezing, no rhonchi Abdominal: Soft, NT, ND, bowel sounds + Extremities: no edema, no cyanosis    The results of significant diagnostics from this hospitalization (including imaging, microbiology, ancillary and laboratory) are listed below for reference.     Microbiology: No results found for this or any previous visit (from the past 240 hours).   Labs: BNP (last 3 results) No results for input(s): BNP in the last 8760 hours. Basic Metabolic Panel: Recent Labs  Lab 03/02/24 1920  NA 135  K 3.0*  CL 100  CO2 23  GLUCOSE 134*  BUN 10  CREATININE 0.70  CALCIUM  9.3   Liver Function Tests: Recent Labs  Lab 03/02/24 1920  AST 37  ALT 43  ALKPHOS 97   BILITOT 2.4*  PROT 7.5  ALBUMIN 4.1   No results for input(s): LIPASE, AMYLASE in the last 168 hours. No results for input(s): AMMONIA in the last 168 hours. CBC: Recent Labs  Lab 03/02/24 1920  WBC 13.0*  NEUTROABS 11.6*  HGB 17.8*  HCT 52.1*  MCV 90.8  PLT 166   Cardiac Enzymes: No results for input(s): CKTOTAL, CKMB, CKMBINDEX, TROPONINI in the last 168 hours. BNP: Invalid input(s): POCBNP CBG: Recent Labs  Lab 03/02/24 2147 03/02/24 2233 03/03/24 0711 03/03/24 0804 03/03/24 1121  GLUCAP 176* 152* 121* 137* 126*   D-Dimer No results for input(s): DDIMER in the last 72 hours. Hgb A1c No results for input(s): HGBA1C in the last 72 hours. Lipid Profile Recent Labs    03/03/24 0457  CHOL 93  HDL 24*  LDLCALC 46  TRIG 884  CHOLHDL 3.9   Thyroid  function studies No results for input(s): TSH, T4TOTAL, T3FREE, THYROIDAB in the last 72 hours.  Invalid input(s): FREET3 Anemia work up No results for input(s): VITAMINB12, FOLATE, FERRITIN, TIBC, IRON, RETICCTPCT in the last 72 hours. Urinalysis    Component Value Date/Time   COLORURINE YELLOW 09/12/2023 0725   APPEARANCEUR CLEAR 09/12/2023 0725   LABSPEC 1.026 09/12/2023 0725   PHURINE 7.0 09/12/2023 0725   GLUCOSEU >=500 (A) 09/12/2023 0725   HGBUR NEGATIVE 09/12/2023 0725   BILIRUBINUR negative 01/04/2024 1140   KETONESUR negative 01/04/2024 1140   KETONESUR NEGATIVE 09/12/2023 0725   PROTEINUR NEGATIVE 09/12/2023 0725   UROBILINOGEN 0.2 01/04/2024 1140   UROBILINOGEN 0.2 05/30/2015 1628   NITRITE Positive (A) 01/04/2024 1140   NITRITE NEGATIVE 09/12/2023 0725   LEUKOCYTESUR Small (1+) (A) 01/04/2024 1140   LEUKOCYTESUR NEGATIVE 09/12/2023 0725   Sepsis Labs Recent Labs  Lab 03/02/24 1920  WBC 13.0*   Microbiology No results found for this or any previous visit (from the past 240 hours).   Time coordinating discharge: 35  minutes  SIGNED:   Adron JONETTA Fairly, DO [facility] 03/03/2024, 2:09 PM  If 7PM-7AM, please contact night-coverage [email or web address]

## 2024-03-03 NOTE — Evaluation (Signed)
 Occupational Therapy Evaluation Patient Details Name: Randy Hebert MRN: [id number] DOB: 11-23-1965 Today's Date: 03/03/2024   History of Present Illness   Randy Hebert is a 58 y.o. male with medical history significant for anxiety, asthma, back pain, coronary artery disease, CHF, COPD, depression type 2 diabetes mellitus, hypertension dyslipidemia and OSA, on CPAP nightly, who presented to the emergency room with acute onset of recent acute CVA with left-sided weakness that has improved since my then the patient had an outpatient MRI that confirmed his CVA.  He has been having difficulty with his gait and balance.  He was called by his PCP to confirm the MRI results.  He admits to headache without dizziness or blurry vision or diplopia.  No tinnitus or vertigo.  No urinary or stool incontinence.  No nausea or vomiting or abdominal pain.  No chest pain or palpitations.  No cough or wheezing or dyspnea.  No dysuria, oliguria or hematuria or flank pain. (per MD)     Clinical Impressions Pt agreeable to OT and PT co-evaluation. Pt appears to be at or near baseline function at this time. Pt is able to ambulate and complete ADL's without assist. No new B UE deficits. Pt left in the bed with call bell within reach. Pt is not recommended for further acute OT services and will be discharged to care of nursing staff for remaining length of stay.             Functional Status Assessment   Patient has not had a recent decline in their functional status     Equipment Recommendations   None recommended by OT             Precautions/Restrictions   Precautions Precautions: None Recall of Precautions/Restrictions: Intact Restrictions Weight Bearing Restrictions Per Provider Order: No     Mobility Bed Mobility Overal bed mobility: Independent                  Transfers Overall transfer level: Independent                        Balance Overall balance  assessment: Mild deficits observed, not formally tested                                         ADL either performed or assessed with clinical judgement   ADL Overall ADL's : Independent                                             Vision Baseline Vision/History: 1 Wears glasses;4 Cataracts Ability to See in Adequate Light: 1 Impaired Patient Visual Report: No change from baseline Vision Assessment?: No apparent visual deficits Additional Comments: outside of baseline     Perception Perception: Not tested       Praxis Praxis: Not tested       Pertinent Vitals/Pain Pain Assessment Pain Assessment: No/denies pain     Extremity/Trunk Assessment Upper Extremity Assessment Upper Extremity Assessment: Overall WFL for tasks assessed (Baseline L hand numbness from cervical spine issues.)   Lower Extremity Assessment Lower Extremity Assessment: Defer to PT evaluation   Cervical / Trunk Assessment Cervical / Trunk Assessment: Normal   Communication Communication Communication: No apparent difficulties  Cognition Arousal: Alert Behavior During Therapy: WFL for tasks assessed/performed Cognition: No apparent impairments                               Following commands: Intact       Cueing  General Comments   Cueing Techniques: Verbal cues                 Home Living Family/patient expects to be discharged to:: Private residence Living Arrangements: Alone Available Help at Discharge: Family;Available PRN/intermittently;Neighbor Type of Home: Apartment Home Access: Elevator     Home Layout: One level     Bathroom Shower/Tub: Chief Strategy Officer: Standard Bathroom Accessibility: Yes How Accessible: Accessible via wheelchair;Accessible via [place] Home Equipment: Cane - single point          Prior Functioning/Environment Prior Level of Function : Independent/Modified Independent              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 ADLs Comments: Independent; drives                            Co-evaluation PT/OT/SLP Co-Evaluation/Treatment: Yes Reason for Co-Treatment: To address functional/ADL transfers   OT goals addressed during session: ADL's and self-care      AM-PAC OT 6 Clicks Daily Activity     Outcome Measure Help from another person eating meals?: None Help from another person taking care of personal grooming?: None Help from another person toileting, which includes using toliet, bedpan, or urinal?: None Help from another person bathing (including washing, rinsing, drying)?: None Help from another person to put on and taking off regular upper body clothing?: None Help from another person to put on and taking off regular lower body clothing?: None 6 Click Score: 24   End of Session    Activity Tolerance: Patient tolerated treatment well Patient left: in bed;with call bell/phone within reach  OT Visit Diagnosis: Other symptoms and signs involving the nervous system (M70.101)                Time: 9187-9175 OT Time Calculation (min): 12 min Charges:  OT General Charges $OT Visit: 1 Visit OT Evaluation $OT Eval Low Complexity: 1 Low  Oliviana Mcgahee OT, MOT  Jayson Person 03/03/2024, 9:41 AM

## 2024-03-08 NOTE — H&P (Signed)
 Surgical History & Physical  Patient Name: Randy Hebert  DOB: 05-Aug-1966  Surgery: Cataract extraction with intraocular lens implant phacoemulsification; Right Eye Surgeon: Lynwood Hermann MD Surgery Date: 03/18/2024 Pre-Op Date: 01/21/2024  HPI: A 27 Yr. old male patient 1.  The patient is a new patient present for Cataract Evaluation. The patient complains of difficulty when reading fine print, books, newspaper, instructions etc., which began 1 year ago. Both eyes are affected. The episode is constant. The patient describes foggy symptoms affecting their eyes/vision. The condition's severity increased since last visit. This is negatively affecting the patient's quality of life and the patient is unable to function adequately in life with the current level of vision. HPI Completed by Dr. Lynwood Hermann  Medical History: Cataracts  Arthritis Cancer Diabetes Heart Problem High Blood Pressure LDL Lung Problems  Review of Systems Cardiovascular High Blood Pressure, heart attack x3 Endocrine diabetes Musculoskeletal arthritis Respiratory lung cancer All recorded systems are negative except as noted above.  Social Current every day smoker  Medication Cephalexin , Citalopram , Atorvastatin , Glipizide , Naproxen , Symbicort , Citalopram , Pantoprazole , Farxiga , Trulicity   Sx/Procedures None  Drug Allergies  Chantix , Amoxicillin , Lisinopril   History & Physical: Heent: cataract NECK: supple without bruits LUNGS: lungs clear to auscultation CV: regular rate and rhythm Abdomen: soft and non-tender  Impression & Plan: Assessment: 1.  COMBINED FORMS AGE RELATED CATARACT; Both Eyes (H25.813) 2.  BLEPHARITIS; Right Upper Lid, Right Lower Lid, Left Upper Lid, Left Lower Lid (H01.001, H01.002,H01.004,H01.005) 3.  DERMATOCHALASIS, no surgery; Right Upper Lid, Left Upper Lid (H02.831, H02.834) 4.  Pinguecula; Both Eyes (H11.153) 5.  ASTIGMATISM, REGULAR; Both Eyes (H52.223)  Plan: 1.   Cataract accounts for the patient's decreased vision. This visual impairment is not correctable with a tolerable change in glasses or contact lenses. Cataract surgery with an implantation of a new lens should significantly improve the visual and functional status of the patient. Discussed all risks, benefits, alternatives, and potential complications. Discussed the procedures and recovery. Patient desires to have surgery. A-scan ordered and performed today for intra-ocular lens calculations. The surgery will be performed in order to improve vision for driving, reading, and for eye examinations. Recommend phacoemulsification with intra-ocular lens. Recommend Dextenza  for post-operative pain and inflammation. Left Eye worse. OS first, then OD. Dilates well - shugarcaine by protocol. Recommend Toric Lens.  2.  Blepharitis is present - recommend regular lid cleaning.  3.  Asymptomatic, recommend observation for now. Findings, prognosis and treatment options reviewed.  4.  Observe; Artificial tears as needed for irritation.  5.  Recommend Toric IOL OU.

## 2024-03-11 ENCOUNTER — Emergency Department (HOSPITAL_COMMUNITY)

## 2024-03-11 ENCOUNTER — Encounter (HOSPITAL_COMMUNITY): Payer: Self-pay

## 2024-03-11 ENCOUNTER — Inpatient Hospital Stay (HOSPITAL_COMMUNITY)
Admission: EM | Admit: 2024-03-11 | Discharge: 2024-03-19 | DRG: 908 | Disposition: A | Discharge status: Home / Self Care | Attending: Internal Medicine | Admitting: Internal Medicine

## 2024-03-11 ENCOUNTER — Other Ambulatory Visit: Payer: Self-pay

## 2024-03-11 ENCOUNTER — Encounter (HOSPITAL_COMMUNITY)
Admission: EM | Disposition: A | Discharge status: Home / Self Care | Payer: Self-pay | Source: Home / Self Care | Attending: Internal Medicine

## 2024-03-11 ENCOUNTER — Ambulatory Visit: Payer: Self-pay | Admitting: *Deleted

## 2024-03-11 DIAGNOSIS — S43035A Inferior dislocation of left humerus, initial encounter: Secondary | ICD-10-CM | POA: Diagnosis present

## 2024-03-11 DIAGNOSIS — Z955 Presence of coronary angioplasty implant and graft: Secondary | ICD-10-CM

## 2024-03-11 DIAGNOSIS — R61 Generalized hyperhidrosis: Secondary | ICD-10-CM | POA: Diagnosis present

## 2024-03-11 DIAGNOSIS — Z7902 Long term (current) use of antithrombotics/antiplatelets: Secondary | ICD-10-CM

## 2024-03-11 DIAGNOSIS — F419 Anxiety disorder, unspecified: Secondary | ICD-10-CM | POA: Diagnosis present

## 2024-03-11 DIAGNOSIS — S45002A Unspecified injury of axillary artery, left side, initial encounter: Secondary | ICD-10-CM | POA: Diagnosis present

## 2024-03-11 DIAGNOSIS — I721 Aneurysm of artery of upper extremity: Secondary | ICD-10-CM | POA: Diagnosis present

## 2024-03-11 DIAGNOSIS — E871 Hypo-osmolality and hyponatremia: Secondary | ICD-10-CM | POA: Diagnosis present

## 2024-03-11 DIAGNOSIS — Z7401 Bed confinement status: Secondary | ICD-10-CM | POA: Diagnosis not present

## 2024-03-11 DIAGNOSIS — S43005A Unspecified dislocation of left shoulder joint, initial encounter: Secondary | ICD-10-CM | POA: Diagnosis present

## 2024-03-11 DIAGNOSIS — Z860101 Personal history of adenomatous and serrated colon polyps: Secondary | ICD-10-CM

## 2024-03-11 DIAGNOSIS — W19XXXA Unspecified fall, initial encounter: Secondary | ICD-10-CM | POA: Diagnosis present

## 2024-03-11 DIAGNOSIS — Z7982 Long term (current) use of aspirin: Secondary | ICD-10-CM

## 2024-03-11 DIAGNOSIS — R064 Hyperventilation: Secondary | ICD-10-CM | POA: Diagnosis present

## 2024-03-11 DIAGNOSIS — Z7984 Long term (current) use of oral hypoglycemic drugs: Secondary | ICD-10-CM

## 2024-03-11 DIAGNOSIS — Z7985 Long-term (current) use of injectable non-insulin antidiabetic drugs: Secondary | ICD-10-CM

## 2024-03-11 DIAGNOSIS — I5042 Chronic combined systolic (congestive) and diastolic (congestive) heart failure: Secondary | ICD-10-CM | POA: Diagnosis present

## 2024-03-11 DIAGNOSIS — F17210 Nicotine dependence, cigarettes, uncomplicated: Secondary | ICD-10-CM | POA: Diagnosis present

## 2024-03-11 DIAGNOSIS — K760 Fatty (change of) liver, not elsewhere classified: Secondary | ICD-10-CM | POA: Diagnosis present

## 2024-03-11 DIAGNOSIS — D72829 Elevated white blood cell count, unspecified: Secondary | ICD-10-CM | POA: Diagnosis present

## 2024-03-11 DIAGNOSIS — S40022A Contusion of left upper arm, initial encounter: Secondary | ICD-10-CM | POA: Diagnosis present

## 2024-03-11 DIAGNOSIS — E66811 Obesity, class 1: Secondary | ICD-10-CM | POA: Diagnosis present

## 2024-03-11 DIAGNOSIS — E782 Mixed hyperlipidemia: Secondary | ICD-10-CM | POA: Diagnosis present

## 2024-03-11 DIAGNOSIS — Z720 Tobacco use: Secondary | ICD-10-CM | POA: Diagnosis present

## 2024-03-11 DIAGNOSIS — Z8616 Personal history of COVID-19: Secondary | ICD-10-CM

## 2024-03-11 DIAGNOSIS — S45009A Unspecified injury of axillary artery, unspecified side, initial encounter: Secondary | ICD-10-CM | POA: Diagnosis not present

## 2024-03-11 DIAGNOSIS — Z96641 Presence of right artificial hip joint: Secondary | ICD-10-CM | POA: Diagnosis present

## 2024-03-11 DIAGNOSIS — W010XXA Fall on same level from slipping, tripping and stumbling without subsequent striking against object, initial encounter: Secondary | ICD-10-CM | POA: Diagnosis present

## 2024-03-11 DIAGNOSIS — G4733 Obstructive sleep apnea (adult) (pediatric): Secondary | ICD-10-CM | POA: Diagnosis present

## 2024-03-11 DIAGNOSIS — Z888 Allergy status to other drugs, medicaments and biological substances status: Secondary | ICD-10-CM

## 2024-03-11 DIAGNOSIS — S43015A Anterior dislocation of left humerus, initial encounter: Secondary | ICD-10-CM | POA: Diagnosis present

## 2024-03-11 DIAGNOSIS — Z6832 Body mass index (BMI) 32.0-32.9, adult: Secondary | ICD-10-CM

## 2024-03-11 DIAGNOSIS — E1165 Type 2 diabetes mellitus with hyperglycemia: Secondary | ICD-10-CM | POA: Diagnosis present

## 2024-03-11 DIAGNOSIS — S45092A Other specified injury of axillary artery, left side, initial encounter: Principal | ICD-10-CM | POA: Diagnosis present

## 2024-03-11 DIAGNOSIS — J4489 Other specified chronic obstructive pulmonary disease: Secondary | ICD-10-CM | POA: Diagnosis present

## 2024-03-11 DIAGNOSIS — I10 Essential (primary) hypertension: Secondary | ICD-10-CM | POA: Diagnosis present

## 2024-03-11 DIAGNOSIS — S45902A Unspecified injury of unspecified blood vessel at shoulder and upper arm level, left arm, initial encounter: Principal | ICD-10-CM

## 2024-03-11 DIAGNOSIS — M19019 Primary osteoarthritis, unspecified shoulder: Secondary | ICD-10-CM | POA: Diagnosis present

## 2024-03-11 DIAGNOSIS — Z87892 Personal history of anaphylaxis: Secondary | ICD-10-CM

## 2024-03-11 DIAGNOSIS — Z7951 Long term (current) use of inhaled steroids: Secondary | ICD-10-CM

## 2024-03-11 DIAGNOSIS — K7460 Unspecified cirrhosis of liver: Secondary | ICD-10-CM | POA: Diagnosis present

## 2024-03-11 DIAGNOSIS — Z823 Family history of stroke: Secondary | ICD-10-CM

## 2024-03-11 DIAGNOSIS — K2270 Barrett's esophagus without dysplasia: Secondary | ICD-10-CM | POA: Diagnosis present

## 2024-03-11 DIAGNOSIS — Z8249 Family history of ischemic heart disease and other diseases of the circulatory system: Secondary | ICD-10-CM

## 2024-03-11 DIAGNOSIS — Z8261 Family history of arthritis: Secondary | ICD-10-CM

## 2024-03-11 DIAGNOSIS — R0789 Other chest pain: Secondary | ICD-10-CM | POA: Diagnosis present

## 2024-03-11 DIAGNOSIS — I69354 Hemiplegia and hemiparesis following cerebral infarction affecting left non-dominant side: Secondary | ICD-10-CM

## 2024-03-11 DIAGNOSIS — Z833 Family history of diabetes mellitus: Secondary | ICD-10-CM

## 2024-03-11 DIAGNOSIS — F32A Depression, unspecified: Secondary | ICD-10-CM | POA: Diagnosis present

## 2024-03-11 DIAGNOSIS — I252 Old myocardial infarction: Secondary | ICD-10-CM

## 2024-03-11 DIAGNOSIS — Z820 Family history of epilepsy and other diseases of the nervous system: Secondary | ICD-10-CM

## 2024-03-11 DIAGNOSIS — Z79899 Other long term (current) drug therapy: Secondary | ICD-10-CM

## 2024-03-11 DIAGNOSIS — F1010 Alcohol abuse, uncomplicated: Secondary | ICD-10-CM | POA: Diagnosis present

## 2024-03-11 DIAGNOSIS — Z8701 Personal history of pneumonia (recurrent): Secondary | ICD-10-CM

## 2024-03-11 DIAGNOSIS — E1159 Type 2 diabetes mellitus with other circulatory complications: Secondary | ICD-10-CM | POA: Diagnosis present

## 2024-03-11 DIAGNOSIS — I110 Hypertensive heart disease with heart failure: Secondary | ICD-10-CM | POA: Diagnosis present

## 2024-03-11 DIAGNOSIS — I2510 Atherosclerotic heart disease of native coronary artery without angina pectoris: Secondary | ICD-10-CM | POA: Diagnosis present

## 2024-03-11 DIAGNOSIS — K219 Gastro-esophageal reflux disease without esophagitis: Secondary | ICD-10-CM | POA: Diagnosis present

## 2024-03-11 DIAGNOSIS — R0689 Other abnormalities of breathing: Secondary | ICD-10-CM | POA: Diagnosis not present

## 2024-03-11 DIAGNOSIS — R0902 Hypoxemia: Secondary | ICD-10-CM | POA: Diagnosis not present

## 2024-03-11 DIAGNOSIS — D62 Acute posthemorrhagic anemia: Secondary | ICD-10-CM | POA: Diagnosis not present

## 2024-03-11 DIAGNOSIS — Y92009 Unspecified place in unspecified non-institutional (private) residence as the place of occurrence of the external cause: Secondary | ICD-10-CM | POA: Diagnosis present

## 2024-03-11 HISTORY — PX: ARTERY REPAIR: SHX559

## 2024-03-11 LAB — CBC WITH DIFFERENTIAL/PLATELET
Abs Immature Granulocytes: 0.24 K/uL — ABNORMAL HIGH (ref 0.00–0.07)
Basophils Absolute: 0.1 K/uL (ref 0.0–0.1)
Basophils Relative: 1 %
Eosinophils Absolute: 0.2 K/uL (ref 0.0–0.5)
Eosinophils Relative: 1 %
HCT: 51.5 % (ref 39.0–52.0)
Hemoglobin: 18 g/dL — ABNORMAL HIGH (ref 13.0–17.0)
Immature Granulocytes: 1 %
Lymphocytes Relative: 7 %
Lymphs Abs: 1.6 K/uL (ref 0.7–4.0)
MCH: 31.9 pg (ref 26.0–34.0)
MCHC: 35 g/dL (ref 30.0–36.0)
MCV: 91.2 fL (ref 80.0–100.0)
Monocytes Absolute: 1.4 K/uL — ABNORMAL HIGH (ref 0.1–1.0)
Monocytes Relative: 6 %
Neutro Abs: 21 K/uL — ABNORMAL HIGH (ref 1.7–7.7)
Neutrophils Relative %: 84 %
Platelets: 274 K/uL (ref 150–400)
RBC: 5.65 MIL/uL (ref 4.22–5.81)
RDW: 13.3 % (ref 11.5–15.5)
WBC: 24.5 K/uL — ABNORMAL HIGH (ref 4.0–10.5)
nRBC: 0 % (ref 0.0–0.2)

## 2024-03-11 LAB — TROPONIN I (HIGH SENSITIVITY)
Troponin I (High Sensitivity): 9 ng/L (ref <18)
Troponin I (High Sensitivity): 8 ng/L (ref <18)

## 2024-03-11 LAB — COMPREHENSIVE METABOLIC PANEL WITH GFR
ALT: 46 U/L — ABNORMAL HIGH (ref 0–44)
AST: 36 U/L (ref 15–41)
Albumin: 4 g/dL (ref 3.5–5.0)
Alkaline Phosphatase: 101 U/L (ref 38–126)
Anion gap: 17 — ABNORMAL HIGH (ref 5–15)
BUN: 12 mg/dL (ref 6–20)
CO2: 20 mmol/L — ABNORMAL LOW (ref 22–32)
Calcium: 9.3 mg/dL (ref 8.9–10.3)
Chloride: 99 mmol/L (ref 98–111)
Creatinine, Ser: 0.85 mg/dL (ref 0.61–1.24)
GFR, Estimated: >60 mL/min (ref >60)
Glucose, Bld: 223 mg/dL — ABNORMAL HIGH (ref 70–99)
Potassium: 3.6 mmol/L (ref 3.5–5.1)
Sodium: 136 mmol/L (ref 135–145)
Total Bilirubin: 1.4 mg/dL — ABNORMAL HIGH (ref 0.0–1.2)
Total Protein: 7.8 g/dL (ref 6.5–8.1)

## 2024-03-11 SURGERY — REPAIR, ARTERY, BRACHIAL
Anesthesia: General | Site: Axilla | Laterality: Left

## 2024-03-11 MED ORDER — HYDROMORPHONE HCL 1 MG/ML IJ SOLN
1.0000 mg | Freq: Once | INTRAMUSCULAR | Status: AC
  Administered 2024-03-11: 1 mg via INTRAVENOUS
  Filled 2024-03-11: qty 1

## 2024-03-11 MED ORDER — IOHEXOL 350 MG/ML SOLN
100.0000 mL | Freq: Once | INTRAVENOUS | Status: AC | PRN
Start: 2024-03-11 — End: 2024-03-11
  Administered 2024-03-11: 75 mL via INTRAVENOUS

## 2024-03-11 MED ORDER — IOHEXOL 350 MG/ML SOLN: 75.0000 mL | Freq: Once | INTRAVENOUS | Status: DC | PRN

## 2024-03-11 MED ORDER — PROPOFOL 10 MG/ML IV BOLUS
0.5000 mg/kg | Freq: Once | INTRAVENOUS | Status: AC
  Administered 2024-03-11: 43.8 mg via INTRAVENOUS
  Filled 2024-03-11: qty 20

## 2024-03-11 MED ORDER — FENTANYL CITRATE (PF) 250 MCG/5ML IJ SOLN
INTRAMUSCULAR | Status: AC
  Filled 2024-03-11: qty 5

## 2024-03-11 MED ORDER — ONDANSETRON HCL 4 MG/2ML IJ SOLN
4.0000 mg | Freq: Once | INTRAMUSCULAR | Status: AC
  Administered 2024-03-11: 4 mg via INTRAVENOUS
  Filled 2024-03-11: qty 2

## 2024-03-11 MED ORDER — CEFAZOLIN SODIUM-DEXTROSE 2-4 GM/100ML-% IV SOLN
INTRAVENOUS | Status: AC
  Filled 2024-03-11: qty 100

## 2024-03-11 MED ORDER — MIDAZOLAM HCL 2 MG/2ML IJ SOLN
INTRAMUSCULAR | Status: AC
  Filled 2024-03-11: qty 2

## 2024-03-11 MED ORDER — OXYCODONE HCL 5 MG PO TABS
5.0000 mg | ORAL_TABLET | Freq: Once | ORAL | Status: AC
  Administered 2024-03-11: 5 mg via ORAL
  Filled 2024-03-11: qty 1

## 2024-03-11 MED ORDER — ACETAMINOPHEN 10 MG/ML IV SOLN
INTRAVENOUS | Status: AC
  Filled 2024-03-11: qty 100

## 2024-03-11 MED ORDER — PROPOFOL 10 MG/ML IV BOLUS
0.5000 mg/kg | Freq: Once | INTRAVENOUS | Status: AC
  Administered 2024-03-11: 43.8 mg via INTRAVENOUS

## 2024-03-11 MED ORDER — FENTANYL CITRATE PF 50 MCG/ML IJ SOSY
50.0000 ug | PREFILLED_SYRINGE | Freq: Once | INTRAMUSCULAR | Status: AC
  Administered 2024-03-11: 50 ug via INTRAVENOUS
  Filled 2024-03-11: qty 1

## 2024-03-11 SURGICAL SUPPLY — 33 items
BAG COUNTER SPONGE SURGICOUNT (BAG) ×1 IMPLANT
BNDG COMPR ESMARK 4X3 LF (GAUZE/BANDAGES/DRESSINGS) IMPLANT
CANISTER SUCTION 3000ML PPV (SUCTIONS) ×1 IMPLANT
CLIP TI MEDIUM 6 (CLIP) ×1 IMPLANT
CLIP TI WIDE RED SMALL 6 (CLIP) ×1 IMPLANT
CUFF TOURN SGL QUICK 18X4 (TOURNIQUET CUFF) IMPLANT
CUFF TRNQT CYL 24X4X16.5-23 (TOURNIQUET CUFF) IMPLANT
DERMABOND ADVANCED .7 DNX12 (GAUZE/BANDAGES/DRESSINGS) ×1 IMPLANT
DRAIN CHANNEL 19F RND (DRAIN) IMPLANT
DRSG COVADERM 4X6 (GAUZE/BANDAGES/DRESSINGS) IMPLANT
ELECTRODE REM PT RTRN 9FT ADLT (ELECTROSURGICAL) ×1 IMPLANT
EVACUATOR SILICONE 100CC (DRAIN) IMPLANT
GLOVE BIO SURGEON STRL SZ7 (GLOVE) ×1 IMPLANT
GLOVE SURG SS PI 7.5 STRL IVOR (GLOVE) ×2 IMPLANT
GOWN STRL REUS W/ TWL LRG LVL3 (GOWN DISPOSABLE) ×3 IMPLANT
KIT BASIN OR (CUSTOM PROCEDURE TRAY) ×1 IMPLANT
KIT TURNOVER KIT B (KITS) ×1 IMPLANT
NS IRRIG 1000ML POUR BTL (IV SOLUTION) ×1 IMPLANT
PACK CV ACCESS (CUSTOM PROCEDURE TRAY) ×1 IMPLANT
PAD ARMBOARD POSITIONER FOAM (MISCELLANEOUS) ×2 IMPLANT
PAD CAST 4YDX4 CTTN HI CHSV (CAST SUPPLIES) IMPLANT
SPIKE FLUID TRANSFER (MISCELLANEOUS) IMPLANT
STAPLER SKIN PROX 35W (STAPLE) IMPLANT
SURGIFLO W/THROMBIN 8M KIT (HEMOSTASIS) IMPLANT
SUT ETHILON 3 0 PS 1 (SUTURE) IMPLANT
SUT MNCRL AB 4-0 PS2 18 (SUTURE) ×1 IMPLANT
SUT PROLENE 5 0 C 1 24 (SUTURE) IMPLANT
SUT PROLENE 6 0 BV (SUTURE) ×1 IMPLANT
SUT VIC AB 2-0 CT1 TAPERPNT 27 (SUTURE) IMPLANT
SUT VIC AB 3-0 SH 27X BRD (SUTURE) ×1 IMPLANT
TOWEL GREEN STERILE (TOWEL DISPOSABLE) ×1 IMPLANT
UNDERPAD 30X36 HEAVY ABSORB (UNDERPADS AND DIAPERS) ×1 IMPLANT
WATER STERILE IRR 1000ML POUR (IV SOLUTION) ×1 IMPLANT

## 2024-03-11 NOTE — Consult Note (Signed)
 [facility] [place]  Patient name: Randy Hebert MRN: [id number] DOB: Oct 11, 1965 Sex: male   REQUESTING PROVIDER:    ER   REASON FOR CONSULT:    Left axillary artery bleeding  HISTORY OF PRESENT ILLNESS:   DRESHON PROFFIT is a 58 y.o. male, who presented to the [facility] emergency department with left shoulder pain.  He tripped over a screw in his apartment and landed on his left shoulder.  He did not hit his head or lose consciousness.  His arm was reduced in the ER.  He developed worsening pain and was taken to a CT scan that shows active extravasation and a branch off of the axillary artery.  He has intact distal pulses.  The patient has a history of a recent acute stroke with left-sided weakness.  He suffers from COPD secondary to asthma and tobacco abuse.  He has coronary artery disease status post STEMI in 2016.  He is a type II diabetic.  He is medically managed for hypertension and hyperlipidemia.  PAST MEDICAL HISTORY    Past Medical History:  Diagnosis Date   ACE inhibitor-aggravated angioedema    Allergy     Anemia    Angio-edema    Anxiety    Arthritis    Asthma    hip replacement   Back pain    Bradycardia 04/28/2017   Bulging of cervical intervertebral disc    CAD (coronary artery disease)    lateral STEMI 02/06/2015 00% D1 occlusion treated with Promus Premier 2.5 mm x 16 mm DES, 70% ramus stenosis, 40% mid RCA stenosis, 45% distal RCA stenosis, EF 45-50%   CHF (congestive heart failure) (HCC)    COPD (chronic obstructive pulmonary disease) (HCC)    Depression    Diabetes mellitus without complication (HCC)    Difficult intubation    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   Dry eye    Dyspnea    Early satiety 09/23/2016   Fatty liver    GERD (gastroesophageal reflux disease)    HCAP (healthcare-associated pneumonia) 05/15/2017   Headache    Heart murmur    Hip pain     Hyperlipidemia    Hypertension    Lobar pneumonia (HCC) 05/15/2017   Melena 08/04/2018   MI (myocardial infarction) (HCC)    Myocardial infarction (HCC)    Neck pain    Non-ST elevation (NSTEMI) myocardial infarction (HCC) 04/27/2017   NSTEMI (non-ST elevated myocardial infarction) (HCC) 04/26/2017   Otitis media    Pleurisy    Pneumonia due to COVID-19 virus 10/07/2019   Rectal bleeding 11/08/2018   Right shoulder pain 03/20/2020   Sinus pause    9 sec sinus pause on telemetry after started on coreg  after MI, avoid AV nodal blocking agent   Sleep apnea    uses a cpap   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   Arthritis Mother    Heart disease Brother  62       died in sleep   Early death Brother    Diabetes Maternal Uncle    Alzheimer's disease Maternal Grandmother     SOCIAL HISTORY:   Social History   Socioeconomic History   Marital status: Divorced    Spouse name: alice   Number of children: 3   Years of education: 12   Highest education level: Not on file  Occupational History   Occupation: SSI  Tobacco Use   Smoking status: Every Day    Current packs/day: 1.00    Average packs/day: 1 pack/day for 46.0 years (46.0 ttl pk-yrs)    Types: Cigarettes    Passive exposure: Never   Smokeless tobacco: Former    Types: Chew   Tobacco comments:    Smokes 1 pack a day of cigarettes (20 cig)10/16/23  Vaping Use   Vaping status: Former  Substance and Sexual Activity   Alcohol  use: Yes    Comment: rare beer use   Drug use: No     Comment: history of drug use- marijuana, cocaine- quit 2014   Sexual activity: Yes    Partners: Female    Birth control/protection: None  Other Topics Concern   Not on file  Social History Narrative   Lives with girlfriend [name]   3 children, but 1 OD  In 2019.   Grandchildren-2      Two cats: may and princess; and a dog-foxy      Enjoy: spending time with pets, TV, yard work       Diet: eats all food groups   Caffeine: half and half coffee, some tea-half and half decaf   Water : 6-8 cups daily      Wears seat belt   Does not use phone while driving   Smoke detectors at home      Social Drivers of Health   Financial Resource Strain: Low Risk  (01/08/2024)   Overall Financial Resource Strain (CARDIA)    Difficulty of Paying Living Expenses: Not hard at all  Food Insecurity: No Food Insecurity (03/02/2024)   Hunger Vital Sign    Worried About Running Out of Food in the Last Year: Never true    Ran Out of Food in the Last Year: Never true  Transportation Needs: No Transportation Needs (03/02/2024)   PRAPARE - Administrator, Civil Service (Medical): No    Lack of Transportation (Non-Medical): No  Physical Activity: Insufficiently Active (01/08/2024)   Exercise Vital Sign    Days of Exercise per Week: 2 days    Minutes of Exercise per Session: 60 min  Stress: No Stress Concern Present (01/08/2024)   [employer] of Occupational Health - Occupational Stress Questionnaire    Feeling of Stress : Not at all  Social Connections: Moderately Integrated (03/02/2024)   Social Connection and Isolation Panel    Frequency of Communication with Friends and Family: More than three times a week    Frequency of Social Gatherings with Friends and Family: Once a week    Attends Religious Services: More than 4 times per year    Active Member of [employer] or Organizations: Yes    Attends Banker Meetings: More than 4 times per year    Marital Status: Divorced  Intimate Partner  Violence: Not At Risk (03/02/2024)   Humiliation, Afraid, Rape, and Kick questionnaire    Fear of Current or Ex-Partner: No    Emotionally Abused: No    Physically Abused: No  Sexually Abused: No    ALLERGIES:    Allergies  Allergen Reactions   Carvedilol  Other (See Comments)    Sinus pause on telemetry >3 seconds. Longest one 9 sec. No AV nodal agent   Lisinopril  Anaphylaxis, Shortness Of Breath and Swelling    Angioedema, required intubation and mechanical ventilation   Chantix  [Varenicline ] Other (See Comments)    Nightmares and unable to sleep well   Amoxicillin  Nausea Only    Childhood reaction     CURRENT MEDICATIONS:    No current facility-administered medications for this encounter.   Current Outpatient Medications  Medication Sig Dispense Refill   acetaminophen  (TYLENOL ) 500 MG tablet Take 2 tablets (1,000 mg total) by mouth every 8 (eight) hours as needed for mild pain or fever.     amLODipine  (NORVASC ) 10 MG tablet Take 1 tablet (10 mg total) by mouth daily. 90 tablet 1   ascorbic acid  (VITAMIN C ) 500 MG tablet Take 1 tablet (500 mg total) by mouth daily. 30 tablet 1   aspirin  EC 81 MG tablet Take 81 mg by mouth 2 (two) times daily. Swallow whole.     aspirin  EC 81 MG tablet Take 1 tablet (81 mg total) by mouth daily. Swallow whole. 30 tablet 12   atorvastatin  (LIPITOR ) 40 MG tablet Take 1 tablet (40 mg total) by mouth daily. 100 tablet 3   Blood Glucose Monitoring Suppl (ACCU-CHEK GUIDE ME) w/Device KIT 1 Piece by Does not apply route as directed. 1 kit 0   Cholecalciferol  (VITAMIN D3) 125 MCG (5000 UT) CAPS Take 1 capsule (5,000 Units total) by mouth daily. 90 capsule 0   citalopram  (CELEXA ) 20 MG tablet Take 1 tablet (20 mg total) by mouth daily. 90 tablet 1   clopidogrel  (PLAVIX ) 75 MG tablet Take 1 tablet (75 mg total) by mouth daily for 21 days. 21 tablet 0   dapagliflozin  propanediol (FARXIGA ) 10 MG TABS tablet Take 1 tablet (10 mg total) by mouth daily. 90  tablet 0   ezetimibe  (ZETIA ) 10 MG tablet Take 1 tablet (10 mg total) by mouth daily. 90 tablet 1   ferrous sulfate  325 (65 FE) MG tablet Take 1 tablet (325 mg total) by mouth daily with breakfast. 90 tablet 1   glucose blood (ACCU-CHEK GUIDE TEST) test strip Use to monitor glucose 2 times daily as instructed 100 strip 2   magnesium  gluconate (MAGONATE) 500 MG tablet Take 500 mg by mouth 2 (two) times daily.     metFORMIN  (GLUCOPHAGE ) 500 MG tablet Take 1 tablet (500 mg total) by mouth 2 (two) times daily with a meal. 180 tablet 0   naproxen  (NAPROSYN ) 250 MG tablet Take 250 mg by mouth every 6 (six) hours as needed for mild pain (pain score 1-3).     nitroGLYCERIN  (NITROSTAT ) 0.4 MG SL tablet Place 1 tablet (0.4 mg total) under the tongue every 5 (five) minutes x 3 doses as needed (if no relief after 3rd dose, proceed to ED or call 911). 25 tablet 3   pantoprazole  (PROTONIX ) 40 MG tablet TAKE (1) TABLET BY MOUTH TWICE DAILY BEFORE A MEAL. 180 tablet 1   RESTASIS  0.05 % ophthalmic emulsion Place 1 drop into both eyes 2 (two) times daily.     SYMBICORT  160-4.5 MCG/ACT inhaler INHALE 2 PUFFS INTO THE LUNGS FIRST THING IN THE MORNING AND 2 PUFFS ABOUT 12 HOURS LATER. 10.2 g 12   TRULICITY  1.5 MG/0.5ML SOAJ Inject 1.5 mg into the skin once a week. 2 mL  1    REVIEW OF SYSTEMS:   [X]  denotes positive finding, [ ]  denotes negative finding Cardiac  Comments:  Chest pain or chest pressure:    Shortness of breath upon exertion:    Short of breath when lying flat:    Irregular heart rhythm:        Vascular    Pain in calf, thigh, or hip brought on by ambulation:    Pain in feet at night that wakes you up from your sleep:     Blood clot in your veins:    Leg swelling:         Pulmonary    Oxygen  at home:    Productive cough:     Wheezing:         Neurologic    Sudden weakness in arms or legs:     Sudden numbness in arms or legs:     Sudden onset of difficulty speaking or slurred speech:     Temporary loss of vision in one eye:     Problems with dizziness:         Gastrointestinal    Blood in stool:      Vomited blood:         Genitourinary    Burning when urinating:     Blood in urine:        Psychiatric    Major depression:         Hematologic    Bleeding problems:    Problems with blood clotting too easily:        Skin    Rashes or ulcers:        Constitutional    Fever or chills:     PHYSICAL EXAM:   Vitals:   03/11/24 1652 03/11/24 1656 03/11/24 1700 03/11/24 1715  BP: 124/70 131/88 114/78 121/69  Pulse: 95 89 90 84  Resp: 17 18 18    Temp: 98 F (36.7 C) 98.1 F (36.7 C) 98.1 F (36.7 C)   TempSrc:      SpO2: 91% (!) 87% (!) 88% 92%  Weight:      Height:        GENERAL: The patient is a well-nourished male, in no acute distress. The vital signs are documented above. CARDIAC: There is a regular rate and rhythm.  VASCULAR: Palpable left radial pulse, axillary hematoma PULMONARY: Nonlabored respirations ABDOMEN: Soft and non-tender with normal pitched bowel sounds.  MUSCULOSKELETAL: There are no major deformities or cyanosis. NEUROLOGIC: No focal weakness or paresthesias are detected. SKIN: There are no ulcers or rashes noted. PSYCHIATRIC: The patient has a normal affect.  STUDIES:   I have reviewed his CT scan with the following findings: Attenuation of the left axillary artery secondary to extrinsic compression. No definitive dissection is identified.   Active hemorrhage is noted within the left axillary hematoma likely related to a small branch of the lateral thoracic artery beneath the axillary hematoma. This is best visualized on the coronal imaging (image number 62 of series 10).    ASSESSMENT and PLAN   Left axillary hematoma with active extravasation.  I have discussed with the patient that we need to go to the operating room for hematoma decompression and ligation of the bleeding vessel.  Risks and benefits of the surgery  were discussed with the patient and wishes to proceed.   Malvina Serene CLORE, MD, FACS [facility] [phone] Pager [phone]

## 2024-03-11 NOTE — ED Provider Notes (Signed)
  11:54 PM Arrives from [facility] due to vascular injury sustained during shoulder dislocation.  Dr. Serene at bedside.  He should be going to OR shortly.  Requesting hospitalist admission as he has recent stroke, CAD, etc.  12:22 AM Spoke with Dr. Franky-- will admit for ongoing care.   Randy Olam HERO, PA-C 03/12/24 0022    Theadore Ozell HERO, MD 03/12/24 [phone]

## 2024-03-11 NOTE — ED Notes (Signed)
 Patient transported to CT

## 2024-03-11 NOTE — ED Provider Notes (Signed)
 [place] EMERGENCY DEPARTMENT AT [facility] Provider Note   CSN: [id number] Arrival date & time: 03/11/24  1444     History  Chief Complaint  Patient presents with   Randy Hebert Randy Hebert is a 58 y.o. male with recent acute CVA w/ left-sided weakness, COPD/asthma, back pain, coronary artery disease w/ STEMI 2016, combined CHF, T2DM, hypertension dyslipidemia and OSA on CPAP nightly, who presents with L shoulder pain. He tripped over screw in his apartment and landed on left shoulder.  Possible clavicle or shoulder dislocation. Patient is diaphoretic.  Patient did not hit his head or lose consciousness. Is also complaining of chest pain and shortness of breath with severe pain in the shoulder. Reports h/o heart attack, COPD, HF, and recent stroke. C/o numbness in the LUE but has severe pain in the shoulder. Intact radial pulse.    Past Medical History:  Diagnosis Date   ACE inhibitor-aggravated angioedema    Allergy     Anemia    Angio-edema    Anxiety    Arthritis    Asthma    hip replacement   Back pain    Bradycardia 04/28/2017   Bulging of cervical intervertebral disc    CAD (coronary artery disease)    lateral STEMI 02/06/2015 00% D1 occlusion treated with Promus Premier 2.5 mm x 16 mm DES, 70% ramus stenosis, 40% mid RCA stenosis, 45% distal RCA stenosis, EF 45-50%   CHF (congestive heart failure) (HCC)    COPD (chronic obstructive pulmonary disease) (HCC)    Depression    Diabetes mellitus without complication (HCC)    Difficult intubation    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   Dry eye    Dyspnea    Early satiety 09/23/2016   Fatty liver    GERD (gastroesophageal reflux disease)    HCAP (healthcare-associated pneumonia) 05/15/2017   Headache    Heart murmur    Hip pain    Hyperlipidemia    Hypertension    Lobar pneumonia (HCC) 05/15/2017   Melena 08/04/2018   MI (myocardial infarction) [facility])    Myocardial infarction (HCC)     Neck pain    Non-ST elevation (NSTEMI) myocardial infarction (HCC) 04/27/2017   NSTEMI (non-ST elevated myocardial infarction) (HCC) 04/26/2017   Otitis media    Pleurisy    Pneumonia due to COVID-19 virus 10/07/2019   Rectal bleeding 11/08/2018   Right shoulder pain 03/20/2020   Sinus pause    9 sec sinus pause on telemetry after started on coreg  after MI, avoid AV nodal blocking agent   Sleep apnea    uses a cpap   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 Authorizing Provider  acetaminophen  (TYLENOL ) 500 MG tablet Take 2 tablets (1,000 mg total) by mouth every 8 (eight) hours as needed for mild pain or fever. 09/06/21   Jens Durand, MD  amLODipine  (NORVASC ) 10 MG tablet Take 1 tablet (10 mg total) by mouth daily. 10/14/23   Alvan Dorn FALCON, MD  ascorbic acid  (VITAMIN C ) 500 MG tablet Take 1 tablet (500 mg total) by mouth daily. 10/12/19  Ricky Fines, MD  aspirin  EC 81 MG tablet Take 81 mg by mouth 2 (two) times daily. Swallow whole.    [provider]  aspirin  EC 81 MG tablet Take 1 tablet (81 mg total) by mouth daily. Swallow whole. 03/03/24   Maree, Pratik D, DO  atorvastatin  (LIPITOR ) 40 MG tablet Take 1 tablet (40 mg total) by mouth daily. 12/09/23   Alvan Dorn FALCON, MD  Blood Glucose Monitoring Suppl (ACCU-CHEK GUIDE ME) w/Device KIT 1 Piece by Does not apply route as directed. 02/20/22   Nida, Gebreselassie W, MD  Cholecalciferol  (VITAMIN D3) 125 MCG (5000 UT) CAPS Take 1 capsule (5,000 Units total) by mouth daily. 07/19/19   Nida, Gebreselassie W, MD  citalopram  (CELEXA ) 20 MG tablet Take 1 tablet (20 mg total) by  mouth daily. 01/04/24   Cook, Jayce G, DO  clopidogrel  (PLAVIX ) 75 MG tablet Take 1 tablet (75 mg total) by mouth daily for 21 days. 03/04/24 03/25/24  Maree, Pratik D, DO  dapagliflozin  propanediol (FARXIGA ) 10 MG TABS tablet Take 1 tablet (10 mg total) by mouth daily. 12/10/23   Nida, Gebreselassie W, MD  ezetimibe  (ZETIA ) 10 MG tablet Take 1 tablet (10 mg total) by mouth daily. 10/14/23   Alvan Dorn FALCON, MD  ferrous sulfate  325 (65 FE) MG tablet Take 1 tablet (325 mg total) by mouth daily with breakfast. 04/02/21   Elnor Fairy HERO, NP  glucose blood (ACCU-CHEK GUIDE TEST) test strip Use to monitor glucose 2 times daily as instructed 02/02/24   Nida, Gebreselassie W, MD  magnesium  gluconate (MAGONATE) 500 MG tablet Take 500 mg by mouth 2 (two) times daily.    [provider]  metFORMIN  (GLUCOPHAGE ) 500 MG tablet Take 1 tablet (500 mg total) by mouth 2 (two) times daily with a meal. 10/15/23   Nida, Ethelle ORN, MD  naproxen  (NAPROSYN ) 250 MG tablet Take 250 mg by mouth every 6 (six) hours as needed for mild pain (pain score 1-3).    [provider]  nitroGLYCERIN  (NITROSTAT ) 0.4 MG SL tablet Place 1 tablet (0.4 mg total) under the tongue every 5 (five) minutes x 3 doses as needed (if no relief after 3rd dose, proceed to ED or call 911). 05/18/23   Alvan Dorn FALCON, MD  pantoprazole  (PROTONIX ) 40 MG tablet TAKE (1) TABLET BY MOUTH TWICE DAILY BEFORE A MEAL. 01/11/24   Rourk, Lamar HERO, MD  RESTASIS  0.05 % ophthalmic emulsion Place 1 drop into both eyes 2 (two) times daily. 02/26/24   [provider]  SYMBICORT  160-4.5 MCG/ACT inhaler INHALE 2 PUFFS INTO THE LUNGS FIRST THING IN THE MORNING AND 2 PUFFS ABOUT 12 HOURS LATER. 12/17/23   Cook, Jayce G, DO  TRULICITY  1.5 MG/0.5ML SOAJ Inject 1.5 mg into the skin once a week. 01/18/24   Nida, Gebreselassie W, MD      Allergies    Carvedilol , Lisinopril , Chantix  [varenicline ], and Amoxicillin     Review of Systems   Review of Systems A  10 point review of systems was performed and is negative unless otherwise reported in HPI.  Physical Exam Updated Vital Signs BP 135/82 (BP Location: Right Arm)   Pulse 84   Temp 98.1 F (36.7 C)   Resp 18   Ht 5' 5 (1.651 m)   Wt 87.5 kg   SpO2 92%   BMI 32.12 kg/m  Physical Exam General: Extremely uncomfortable male, standing leaning over the bed.  HEENT: NCAT, PERRLA, Sclera anicteric, MMM, trachea midline. No facial  trauma noted. Cardiology: RRR, no murmurs/rubs/gallops. No chest wall TTP. Resp: Mild hyperventilation. CTAB, no wheezes, rhonchi, crackles.  Abd: Soft, non-tender, non-distended. No rebound tenderness or guarding.  GU: Deferred. Back: No midline C, T or L spine TTP deformities or stepoffs.  MSK: Obvious step-off deformity of L shoulder. Intact peripheral pulses/cap refill. Soft compartments. NO other signs of trauma. No peripheral edema. Skin: warm, diaphoretic.  Neuro: A&Ox4, CNs II-XII grossly intact. MAEs. Sensation grossly intact.  Psych: Normal mood and affect.   ED Results / Procedures / Treatments   Labs (all labs ordered are listed, but only abnormal results are displayed) Labs Reviewed  CBC WITH DIFFERENTIAL/PLATELET - Abnormal; Notable for the following components:      Result Value   WBC 24.5 (*)    Hemoglobin 18.0 (*)    Neutro Abs 21.0 (*)    Monocytes Absolute 1.4 (*)    Abs Immature Granulocytes 0.24 (*)    All other components within normal limits  COMPREHENSIVE METABOLIC PANEL WITH GFR - Abnormal; Notable for the following components:   CO2 20 (*)    Glucose, Bld 223 (*)    ALT 46 (*)    Total Bilirubin 1.4 (*)    Anion gap 17 (*)    All other components within normal limits  TROPONIN I (HIGH SENSITIVITY)  TROPONIN I (HIGH SENSITIVITY)    EKG EKG Interpretation Date/Time:  Friday March 11 2024 18:21:11 EDT Ventricular Rate:  90 PR Interval:  229 QRS Duration:  143 QT Interval:  394 QTC Calculation: 483 R Axis:   102  Text  Interpretation: Sinus rhythm Prolonged PR interval RBBB and LPFB Lateral infarct, old No acute changes Confirmed by Franklyn Gills [phone]) on 03/11/2024 6:25:19 PM  Radiology CT ANGIO UP EXTREM LEFT W &/OR WO CONTAST Result Date: 03/11/2024 CLINICAL DATA:  Recent shoulder dislocation with surrounding muscular hematoma, initial encounter EXAM: CT ANGIOGRAPHY OF THE LEFT UPPEREXTREMITY TECHNIQUE: Multidetector CT imaging of the left upper extremitywas performed using the standard protocol during bolus administration of intravenous contrast. Multiplanar CT image reconstructions and MIPs were obtained to evaluate the vascular anatomy. RADIATION DOSE REDUCTION: This exam was performed according to the departmental dose-optimization program which includes automated exposure control, adjustment of the mA and/or kV according to patient size and/or use of iterative reconstruction technique. CONTRAST:  75mL OMNIPAQUE  IOHEXOL  350 MG/ML SOLN COMPARISON:  CT of the shoulder from earlier in the same day. FINDINGS: Vascular: Right aortic arch is noted. The subclavian artery is patent throughout its course. The axillary artery is somewhat attenuated secondary to extrinsic compression by the muscular hematoma seen previously. The more distal brachial radial and ulnar arteries are patent. The previously described intramuscular hematoma is again seen within the subscapularis. The soft tissue hematoma in the left axilla is again identified as well with central extravasation identified. Given the lack of venous opacification on the left, this likely represents a small arterial hemorrhage. The overall hematoma measures approximately 6.7 x 5.3 cm in greatest transverse and AP dimensions not significantly changed from the prior exam. Attenuation of the lateral thoracic artery is noted as well related to extrinsic compression by the hematoma. Some mild subcutaneous extension of hemorrhage along the lateral chest wall is again seen similar  to that noted on the prior exam. Nonvascular: Significant degenerative changes of the glenohumeral and acromioclavicular joints are seen. No definitive rib fracture is identified. Visualized structures in the chest and abdomen on the margin of the scan appear within normal limits.  Review of the MIP images confirms the above findings. IMPRESSION: Attenuation of the left axillary artery secondary to extrinsic compression. No definitive dissection is identified. Active hemorrhage is noted within the left axillary hematoma likely related to a small branch of the lateral thoracic artery beneath the axillary hematoma. This is best visualized on the coronal imaging (image number 62 of series 10). Critical Value/emergent results were called by telephone at the time of interpretation on 03/11/2024 at 9:48 pm to Dr. SID BONING , who verbally acknowledged these results. Electronically Signed   By: Oneil Devonshire M.D.   On: 03/11/2024 21:51   CT Shoulder Left Wo Contrast Result Date: 03/11/2024 CLINICAL DATA:  Shoulder trauma, fracture of humerus or scapula. Fall, left shoulder injury EXAM: CT OF THE UPPER LEFT EXTREMITY WITHOUT CONTRAST TECHNIQUE: Multidetector CT imaging of the upper left extremity was performed according to the standard protocol. RADIATION DOSE REDUCTION: This exam was performed according to the departmental dose-optimization program which includes automated exposure control, adjustment of the mA and/or kV according to patient size and/or use of iterative reconstruction technique. COMPARISON:  None Available. FINDINGS: Bones/Joint/Cartilage Normal alignment. No acute fracture or dislocation. Mild acromioclavicular and moderate glenohumeral degenerative arthritis. Degenerative changes are seen within the cervical spine, not optimally profiled on this examination, but with advanced degenerative disc disease at C4-T1 with severe left neuroforaminal narrowing at these levels. Ligaments Suboptimally assessed by  CT. Muscles and Tendons There is marked fatty atrophy of the supraspinatus and infraspinatus musculature. Moderate fatty atrophy of the subscapularis musculature. There is hyperdensity within the residual muscle fibers of the subscapularis with associated thickening, best appreciated on 55/8 and 59/10 in keeping with an intramuscular hematoma. There is a lobulated hyperdense collection noted within the interstitial soft tissues of the left axilla measuring roughly 5.4 x 6.0 x 9.5 cm (84/10, 56/8) in keeping with a acute hematoma. There is interstitial hemorrhage noted surrounding this within the left axilla seen tracking into the subclavicular region and inferiorly along the left chest wall. Soft tissues See above.  Left shoulder effusion is present. IMPRESSION: 1. No acute fracture or dislocation. 2. Moderate glenohumeral degenerative arthritis. 3. Marked fatty atrophy of the supraspinatus and infraspinatus musculature. Moderate fatty atrophy of the subscapularis musculature. 4. Intramuscular hematoma within the residual muscle fibers of the subscapularis. 5. Acute hematoma within the left axilla measuring roughly 5.4 x 6.0 x 9.5 cm. Surrounding interstitial hemorrhage with left axilla and chest wall. 6. Extensive degenerative disc disease at C4-T1 with severe left neuroforaminal narrowing at these levels. Electronically Signed   By: Dorethia Molt M.D.   On: 03/11/2024 19:17   DG Chest Portable 1 View Result Date: 03/11/2024 CLINICAL DATA:  Shortness of breath EXAM: PORTABLE CHEST 1 VIEW COMPARISON:  Chest radiograph dated 03/24/2023 FINDINGS: Implanted loop recorder projects over the medial left lung base. Metallic clip projects over the right apex. Normal lung volumes. Left basilar patchy opacities. No pleural effusion or pneumothorax. Right aortic arch. Similar enlarged cardiomediastinal silhouette. No acute osseous abnormality. IMPRESSION: 1. Left basilar patchy opacities, which may represent atelectasis,  aspiration, or pneumonia. 2. Similar enlarged cardiomediastinal silhouette. 3. Right aortic arch. Electronically Signed   By: Limin  Xu M.D.   On: 03/11/2024 18:57   DG Shoulder Left Result Date: 03/11/2024 CLINICAL DATA:  Tripped over screw in apartment and landed on left shoulder. EXAM: LEFT SHOULDER - 2+ VIEW COMPARISON:  Same-day radiograph at 2:57 p.m. FINDINGS: Successful reduction of the left shoulder dislocation. Curvilinear lucency along the  inferior glenoid suspicious for nondisplaced fracture. Degenerative changes AC and glenohumeral joints. IMPRESSION: 1. Successful reduction of the left glenohumeral dislocation. 2. Curvilinear lucency along the inferior glenoid suspicious for nondisplaced fracture. CT is recommended for further evaluation. Electronically Signed   By: Norman Gatlin M.D.   On: 03/11/2024 17:38   DG Shoulder Left Result Date: 03/11/2024 CLINICAL DATA:  Left shoulder pain following a fall. EXAM: LEFT SHOULDER - 2+ VIEW COMPARISON:  None Available. FINDINGS: Anterior, inferior dislocation of the humeral head relative to the acromion. Moderate humeral head and neck junction spur formation. No fracture seen. IMPRESSION: Anterior, inferior dislocation of the humeral head. Electronically Signed   By: Elspeth Bathe M.D.   On: 03/11/2024 15:28    Procedures .Sedation  Date/Time: 03/11/2024 5:07 PM  Performed by: Franklyn Sid SAILOR, MD Authorized by: Franklyn Sid SAILOR, MD   Consent:    Consent obtained:  Verbal and written   Consent given by:  Patient   Risks discussed:  Allergic reaction, dysrhythmia, inadequate sedation, nausea, vomiting, respiratory compromise necessitating ventilatory assistance and intubation, prolonged sedation necessitating reversal and prolonged hypoxia resulting in organ damage   Alternatives discussed:  Analgesia without sedation Universal protocol:    Immediately prior to procedure, a time out was called: yes     Patient identity confirmed:  Verbally  with patient Indications:    Procedure performed:  Dislocation reduction   Procedure necessitating sedation performed by:  Physician performing sedation Pre-sedation assessment:    Time since last food or drink:  3   ASA classification: class 3 - patient with severe systemic disease     Mouth opening:  3 or more finger widths   Thyromental distance:  2 finger widths   Mallampati score:  II - soft palate, uvula, fauces visible   Neck mobility: normal     Pre-sedation assessments completed and reviewed: pain level     Pre-sedation assessments completed and reviewed: pre-procedure airway patency not reviewed, pre-procedure cardiovascular function not reviewed, pre-procedure hydration status not reviewed, pre-procedure mental status not reviewed, pre-procedure nausea and vomiting status not reviewed, pre-procedure respiratory function not reviewed and pre-procedure temperature not reviewed   A pre-sedation assessment was completed prior to the start of the procedure Immediate pre-procedure details:    Reassessment: Patient reassessed immediately prior to procedure     Reviewed: vital signs, relevant labs/tests and NPO status     Verified: bag valve mask available, emergency equipment available, intubation equipment available, IV patency confirmed, oxygen  available and suction available   Procedure details (see MAR for exact dosages):    Preoxygenation:  Nasal cannula   Sedation:  Propofol    Intended level of sedation: deep   Analgesia:  Fentanyl    Intra-procedure monitoring:  Blood pressure monitoring, cardiac monitor, continuous pulse oximetry, frequent LOC assessments and frequent vital sign checks   Intra-procedure events: hypoxia     Intra-procedure events comment:  Placed on 5L [place]   Intra-procedure management:  Supplemental oxygen    Total Provider sedation time (minutes):  12 Post-procedure details:   A post-sedation assessment was completed following the completion of the procedure.    Attendance: Constant attendance by certified staff until patient recovered     Recovery: Patient returned to pre-procedure baseline     Post-sedation assessments completed and reviewed: post-procedure airway patency not reviewed, post-procedure cardiovascular function not reviewed, post-procedure hydration status not reviewed, post-procedure mental status not reviewed, post-procedure nausea and vomiting status not reviewed, pain score not reviewed, post-procedure respiratory function not  reviewed and post-procedure temperature not reviewed     Patient is stable for discharge or admission: yes     Procedure completion:  Tolerated well, no immediate complications .Reduction of dislocation  Date/Time: 03/11/2024 5:08 PM  Performed by: Franklyn Sid SAILOR, MD Authorized by: Franklyn Sid SAILOR, MD  Consent: Written consent obtained Risks and benefits: risks, benefits and alternatives were discussed Consent given by: patient Patient understanding: patient states understanding of the procedure being performed Imaging studies: imaging studies available Required items: required blood products, implants, devices, and special equipment available Patient identity confirmed: verbally with patient Time out: Immediately prior to procedure a time out was called to verify the correct patient, procedure, equipment, support staff and site/side marked as required. Local anesthesia used: no  Anesthesia: Local anesthesia used: no  Sedation: Patient sedated: yes Sedatives: propofol  Analgesia: fentanyl  Vitals: Vital signs were monitored during sedation.  Patient tolerance: patient tolerated the procedure well with no immediate complications   .Critical Care  Performed by: Franklyn Sid SAILOR, MD Authorized by: Franklyn Sid SAILOR, MD   Critical care provider statement:    Critical care time (minutes):  50   Critical care was necessary to treat or prevent imminent or life-threatening deterioration of the following  conditions:  Circulatory failure and trauma   Critical care was time spent personally by me on the following activities:  Development of treatment plan with patient or surrogate, discussions with consultants, evaluation of patient's response to treatment, examination of patient, ordering and review of laboratory studies, ordering and review of radiographic studies, ordering and performing treatments and interventions, pulse oximetry, re-evaluation of patient's condition, review of old charts and obtaining history from patient or surrogate   Care discussed with: accepting provider at another facility       Medications Ordered in ED Medications  fentaNYL  (SUBLIMAZE ) injection 50 mcg (50 mcg Intravenous Given 03/11/24 1529)  ondansetron  (ZOFRAN ) injection 4 mg (4 mg Intravenous Given 03/11/24 1530)  fentaNYL  (SUBLIMAZE ) injection 50 mcg (50 mcg Intravenous Given 03/11/24 1553)  propofol  (DIPRIVAN ) 10 mg/mL bolus/IV push 43.8 mg (43.8 mg Intravenous Given 03/11/24 1652)  propofol  (DIPRIVAN ) 10 mg/mL bolus/IV push 43.8 mg (43.8 mg Intravenous Given 03/11/24 1701)  oxyCODONE  (Oxy IR/ROXICODONE ) immediate release tablet 5 mg (5 mg Oral Given 03/11/24 1800)  HYDROmorphone  (DILAUDID ) injection 1 mg (1 mg Intravenous Given 03/11/24 1806)  HYDROmorphone  (DILAUDID ) injection 1 mg (1 mg Intravenous Given 03/11/24 1933)  iohexol  (OMNIPAQUE ) 350 MG/ML injection 100 mL (75 mLs Intravenous Contrast Given 03/11/24 2102)  HYDROmorphone  (DILAUDID ) injection 1 mg (1 mg Intravenous Given 03/11/24 2231)    ED Course/ Medical Decision Making/ A&P                          Medical Decision Making Amount and/or Complexity of Data Reviewed Labs: ordered. Radiology: ordered. Decision-making details documented in ED Course.  Risk Prescription drug management. Decision regarding hospitalization.    This patient presents to the ED for concern of L shoulder injury after fall, this involves an extensive number of treatment  options, and is a complaint that carries with it a high risk of complications and morbidity.  I considered the following differential and admission for this acute, potentially life threatening condition.   MDM:    Anterior shoulder dislocation most likely based on exam. Soft compartments, NVI. XR shows anterior/inferior dislocation. Patient in extreme pain, diaphoretic, pacing around the room. Also complained of chest pain and has CAD hx so  obtained EKG/troponin which were reassuring. CXR shows L basilar patchy opacities that are nonspecific. He has no fever but large leukocytosis which could also be from trauma. He states he wasn't having pain before the trauma and was previously in his NSOH, overall doubt bacterial PNA.   Shoulder relocated utilizing 1 mg/kg propofol  and traction/countertraction and upward pressure on humeral head. Patient with significant improvement in pain on awakening from sedation. Post-procedure XR with good relocation and intact NVI. He has possible glenoid fx on XR and CT recommended. CT noncon showed large hematoma. CTA LUE shows active extrav from small branch artery. D/w vascular surgery Dr. Serene who recommends transfer to [facility] emergently for surgery. Patient NPO. Pain difficult to manage throughout his ED stay with multiple rounds of IV dilaudid .    Clinical Course as of 03/11/24 2338  Fri Mar 11, 2024  1517 DG Shoulder Left +shoulder dislocation [HN]  1539 DG Shoulder Left Anterior, inferior dislocation of the humeral head. [HN]  1748 DG Shoulder Left 1. Successful reduction of the left glenohumeral dislocation. 2. Curvilinear lucency along the inferior glenoid suspicious for nondisplaced fracture. CT is recommended for further evaluation.   [HN]  1823 Pt hyperventilating again with severe pain. Had improved pain immediately after sedation but now is in severe pain again. Repeat exam demonstrates no recurrent dislocation, still in sling. Will obtain CXR and CT  shoulder. Has good radial pulse in LUE, still c/o numbness. Initial trop neg, repeat trop p ending. EKG w/ no acute findings. [HN]  1942 CT Shoulder Left Wo Contrast 1. No acute fracture or dislocation. 2. Moderate glenohumeral degenerative arthritis. 3. Marked fatty atrophy of the supraspinatus and infraspinatus musculature. Moderate fatty atrophy of the subscapularis musculature. 4. Intramuscular hematoma within the residual muscle fibers of the subscapularis. 5. Acute hematoma within the left axilla measuring roughly 5.4 x 6.0 x 9.5 cm. Surrounding interstitial hemorrhage with left axilla and chest wall. 6. Extensive degenerative disc disease at C4-T1 with severe left neuroforaminal narrowing at these levels.   [HN]  1942 Consulted to orthopedic surgery and ordered CTA LUE. Reevaluated patient, still has radial pulse and good cap refill in his hand. Pain improved after another 1 Mg of dilaudid . [HN]  2149 CT ANGIO UP EXTREM LEFT W &/OR WO CONTAST Has little arterial extravasation of little branch vessel of lateral thoracic artery. Axillary artery is patent though a little attenuated from the mass effect but is patent. Hematoma isn't much bigger than it was when we did the shoulder CT (3 hours ago). [HN]  2159 Dr. Margrette recommends consulting with vascular surgery. [HN]  2229 D/w vascular surgery who recommends transfer to [place] for surgery. [HN]  2235 Accepted by Dr. Garrick for ED to ED transfer to [facility] [HN]    Clinical Course User Index [HN] Franklyn Sid SAILOR, MD    Labs: I Ordered, and personally interpreted labs.  The pertinent results include:  those listed above  Imaging Studies ordered: I ordered imaging studies including CXR, L shoulder XR I independently visualized and interpreted imaging. I agree with the radiologist interpretation  Additional history obtained from chart review, daughter at bedside.    Cardiac Monitoring: The patient was maintained on a cardiac  monitor.  I personally viewed and interpreted the cardiac monitored which showed an underlying rhythm of: NSR  Reevaluation: After the interventions noted above, I reevaluated the patient and found that they have :stayed the same  Social Determinants of Health: Lives independently  Disposition:  Transfer to  [facility] w/ vascular planning to operate  Co morbidities that complicate the patient evaluation  Past Medical History:  Diagnosis Date   ACE inhibitor-aggravated angioedema    Allergy     Anemia    Angio-edema    Anxiety    Arthritis    Asthma    hip replacement   Back pain    Bradycardia 04/28/2017   Bulging of cervical intervertebral disc    CAD (coronary artery disease)    lateral STEMI 02/06/2015 00% D1 occlusion treated with Promus Premier 2.5 mm x 16 mm DES, 70% ramus stenosis, 40% mid RCA stenosis, 45% distal RCA stenosis, EF 45-50%   CHF (congestive heart failure) (HCC)    COPD (chronic obstructive pulmonary disease) (HCC)    Depression    Diabetes mellitus without complication (HCC)    Difficult intubation    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   Dry eye    Dyspnea    Early satiety 09/23/2016   Fatty liver    GERD (gastroesophageal reflux disease)    HCAP (healthcare-associated pneumonia) 05/15/2017   Headache    Heart murmur    Hip pain    Hyperlipidemia    Hypertension    Lobar pneumonia (HCC) 05/15/2017   Melena 08/04/2018   MI (myocardial infarction) [facility])    Myocardial infarction (HCC)    Neck pain    Non-ST elevation (NSTEMI) myocardial infarction (HCC) 04/27/2017   NSTEMI (non-ST elevated myocardial infarction) (HCC) 04/26/2017   Otitis media    Pleurisy    Pneumonia due to COVID-19 virus 10/07/2019   Rectal bleeding 11/08/2018   Right shoulder pain 03/20/2020   Sinus pause    9 sec sinus pause on telemetry after started on coreg  after MI, avoid AV nodal blocking agent   Sleep apnea    uses a cpap   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   STEMI (ST elevation myocardial infarction) (HCC) 05/31/2015   Substance abuse (HCC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   Syncope 06/29/2017   Syncope and collapse 05/15/2017   Transaminitis 08/04/2018   Unilateral primary osteoarthritis, right hip 07/01/2016     Medicines Meds ordered this encounter  Medications   fentaNYL  (SUBLIMAZE ) injection 50 mcg   ondansetron  (ZOFRAN ) injection 4 mg   fentaNYL  (SUBLIMAZE ) injection 50 mcg   propofol  (DIPRIVAN ) 10 mg/mL bolus/IV push 43.8 mg   propofol  (DIPRIVAN ) 10 mg/mL bolus/IV push 43.8 mg   oxyCODONE  (Oxy IR/ROXICODONE ) immediate release tablet 5 mg    Refill:  0   HYDROmorphone  (DILAUDID ) injection 1 mg   HYDROmorphone  (DILAUDID ) injection 1 mg   DISCONTD: iohexol  (OMNIPAQUE ) 350 MG/ML injection 75 mL   iohexol  (OMNIPAQUE ) 350 MG/ML injection 100 mL   HYDROmorphone  (DILAUDID ) injection 1 mg    I have reviewed the patients home medicines and have made adjustments as needed  Problem List / ED Course: Problem List Items Addressed This Visit   None Visit Diagnoses       Vascular injury of arm, left, initial encounter    -  Primary     Shoulder dislocation, left, initial encounter         Fall in home, initial encounter                       This note was created using dictation software, which may contain spelling or grammatical errors.    Franklyn Sid SAILOR, MD 03/11/24 [phone]

## 2024-03-11 NOTE — Anesthesia Preprocedure Evaluation (Signed)
 Anesthesia Evaluation  Patient identified by MRN, date of birth, ID band Patient awake    Reviewed: Allergy  & Precautions, H&P , NPO status , Patient's Chart, lab work & pertinent test results  History of Anesthesia Complications (+) DIFFICULT AIRWAY and history of anesthetic complications  Airway Mallampati: III  TM Distance: >3 FB Neck ROM: Full    Dental no notable dental hx. (+) Dental Advisory Given   Pulmonary shortness of breath, asthma , sleep apnea , COPD,  COPD inhaler, Current Smoker   Pulmonary exam normal breath sounds clear to auscultation       Cardiovascular hypertension, Pt. on medications + CAD, + Past MI, + Cardiac Stents and +CHF   Rhythm:Regular Rate:Normal     Neuro/Psych  Headaches  Anxiety Depression    CVA, Residual Symptoms    GI/Hepatic Neg liver ROS,GERD  Medicated,,  Endo/Other  diabetes, Type 2, Oral Hypoglycemic Agents    Renal/GU negative Renal ROS  negative genitourinary   Musculoskeletal  (+) Arthritis , Osteoarthritis,    Abdominal   Peds  Hematology negative hematology ROS (+)   Anesthesia Other Findings   Reproductive/Obstetrics negative OB ROS                              Anesthesia Physical Anesthesia Plan  ASA: 4 and emergent  Anesthesia Plan: General   Post-op Pain Management: Ofirmev  IV (intra-op)*   Induction: Intravenous  PONV Risk Score and Plan: 2 and Ondansetron , Dexamethasone  and Midazolam   Airway Management Planned: Oral ETT  Additional Equipment:   Intra-op Plan:   Post-operative Plan: Extubation in OR  Informed Consent: I have reviewed the patients History and Physical, chart, labs and discussed the procedure including the risks, benefits and alternatives for the proposed anesthesia with the patient or authorized representative who has indicated his/her understanding and acceptance.     Dental advisory given  Plan  Discussed with: CRNA  Anesthesia Plan Comments:          Anesthesia Quick Evaluation

## 2024-03-11 NOTE — Telephone Encounter (Signed)
 FYI Only or Action Required?: FYI only for provider.  Patient was last seen in primary care on 03/01/2024 by Cook, Jayce G, DO.  Called Nurse Triage reporting Shoulder Injury.  Symptoms began today.  Interventions attempted: Nothing.  Symptoms are: rapidly worsening.  Triage Disposition: Go to ED Now (Notify PCP)  Patient/caregiver understands and will follow disposition?:  Reason for Disposition  Can't move injured shoulder at all  Answer Assessment - Initial Assessment Questions 1. MECHANISM: How did the injury happen?     Clemens- tripped- hit the floor- landed forward on chest 2. ONSET: When did the injury happen? (e.g., minutes, hours ago)      today 3. APPEARANCE of INJURY: What does the injury look like?      left shoulder and chest injury 4. SEVERITY: Can you move the shoulder normally?      Unable to move shoulder  6. PAIN: Is there pain? If Yes, ask: How bad is the pain?  (Scale 0-10; or none, mild, moderate, severe)     severe  8. OTHER SYMPTOMS: Do you have any other symptoms? (e.g., loss of sensation)     Unable to move arm  Protocols used: Shoulder Injury-A-AH   Copied from CRM [id number]. Topic: Clinical - Red Word Triage >> Mar 11, 2024  2:23 PM Edsel HERO wrote: Fall, injured left shoulder

## 2024-03-11 NOTE — ED Notes (Signed)
 Pt is refusing heart monitor and EKG.

## 2024-03-11 NOTE — ED Triage Notes (Addendum)
 Patient tripped over screw in his apartment and landed on left shoulder.  Possible clavicle or shoulder dislocation. Patient is diaphoretic.  Patient did not hit his head. +radial pulse

## 2024-03-11 NOTE — Progress Notes (Signed)
 Patient ID: Randy Hebert, male   DOB: 06-26-1966, 58 y.o.   MRN: [id number]  Past Medical History:  Diagnosis Date   ACE inhibitor-aggravated angioedema    Allergy     Anemia    Angio-edema    Anxiety    Arthritis    Asthma    hip replacement   Back pain    Bradycardia 04/28/2017   Bulging of cervical intervertebral disc    CAD (coronary artery disease)    lateral STEMI 02/06/2015 00% D1 occlusion treated with Promus Premier 2.5 mm x 16 mm DES, 70% ramus stenosis, 40% mid RCA stenosis, 45% distal RCA stenosis, EF 45-50%   CHF (congestive heart failure) (HCC)    COPD (chronic obstructive pulmonary disease) (HCC)    Depression    Diabetes mellitus without complication (HCC)    Difficult intubation    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   Dry eye    Dyspnea    Early satiety 09/23/2016   Fatty liver    GERD (gastroesophageal reflux disease)    HCAP (healthcare-associated pneumonia) 05/15/2017   Headache    Heart murmur    Hip pain    Hyperlipidemia    Hypertension    Lobar pneumonia (HCC) 05/15/2017   Melena 08/04/2018   MI (myocardial infarction) [facility])    Myocardial infarction (HCC)    Neck pain    Non-ST elevation (NSTEMI) myocardial infarction (HCC) 04/27/2017   NSTEMI (non-ST elevated myocardial infarction) (HCC) 04/26/2017   Otitis media    Pleurisy    Pneumonia due to COVID-19 virus 10/07/2019   Rectal bleeding 11/08/2018   Right shoulder pain 03/20/2020   Sinus pause    9 sec sinus pause on telemetry after started on coreg  after MI, avoid AV nodal blocking agent   Sleep apnea    uses a cpap   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   STEMI (ST elevation myocardial infarction) (HCC) 05/31/2015   Substance abuse (HCC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   Syncope 06/29/2017   Syncope and collapse 05/15/2017   Transaminitis 08/04/2018   Unilateral primary osteoarthritis, right hip 07/01/2016   Social History   Tobacco Use   Smoking status: Every Day     Current packs/day: 1.00    Average packs/day: 1 pack/day for 46.0 years (46.0 ttl pk-yrs)    Types: Cigarettes    Passive exposure: Never   Smokeless tobacco: Former    Types: Chew   Tobacco comments:    Smokes 1 pack a day of cigarettes (20 cig)10/16/23  Vaping Use   Vaping status: Former  Substance Use Topics   Alcohol  use: Yes    Comment: rare beer use   Drug use: No    Comment: history of drug use- marijuana, cocaine- quit 2014

## 2024-03-12 ENCOUNTER — Emergency Department (HOSPITAL_COMMUNITY): Admitting: Certified Registered Nurse Anesthetist

## 2024-03-12 ENCOUNTER — Inpatient Hospital Stay (HOSPITAL_COMMUNITY): Admitting: Certified Registered Nurse Anesthetist

## 2024-03-12 ENCOUNTER — Encounter (HOSPITAL_COMMUNITY): Payer: Self-pay | Admitting: Internal Medicine

## 2024-03-12 ENCOUNTER — Other Ambulatory Visit: Payer: Self-pay

## 2024-03-12 DIAGNOSIS — F17210 Nicotine dependence, cigarettes, uncomplicated: Secondary | ICD-10-CM

## 2024-03-12 DIAGNOSIS — D62 Acute posthemorrhagic anemia: Secondary | ICD-10-CM | POA: Diagnosis not present

## 2024-03-12 DIAGNOSIS — W010XXA Fall on same level from slipping, tripping and stumbling without subsequent striking against object, initial encounter: Secondary | ICD-10-CM | POA: Diagnosis present

## 2024-03-12 DIAGNOSIS — I110 Hypertensive heart disease with heart failure: Secondary | ICD-10-CM | POA: Diagnosis present

## 2024-03-12 DIAGNOSIS — G4733 Obstructive sleep apnea (adult) (pediatric): Secondary | ICD-10-CM | POA: Diagnosis present

## 2024-03-12 DIAGNOSIS — E1159 Type 2 diabetes mellitus with other circulatory complications: Secondary | ICD-10-CM | POA: Diagnosis present

## 2024-03-12 DIAGNOSIS — S43035A Inferior dislocation of left humerus, initial encounter: Secondary | ICD-10-CM | POA: Diagnosis present

## 2024-03-12 DIAGNOSIS — W19XXXS Unspecified fall, sequela: Secondary | ICD-10-CM

## 2024-03-12 DIAGNOSIS — S45092A Other specified injury of axillary artery, left side, initial encounter: Secondary | ICD-10-CM | POA: Diagnosis present

## 2024-03-12 DIAGNOSIS — E66811 Obesity, class 1: Secondary | ICD-10-CM | POA: Diagnosis present

## 2024-03-12 DIAGNOSIS — T81718A Complication of other artery following a procedure, not elsewhere classified, initial encounter: Secondary | ICD-10-CM

## 2024-03-12 DIAGNOSIS — S43005S Unspecified dislocation of left shoulder joint, sequela: Secondary | ICD-10-CM

## 2024-03-12 DIAGNOSIS — I2510 Atherosclerotic heart disease of native coronary artery without angina pectoris: Secondary | ICD-10-CM | POA: Diagnosis present

## 2024-03-12 DIAGNOSIS — S45009A Unspecified injury of axillary artery, unspecified side, initial encounter: Secondary | ICD-10-CM | POA: Diagnosis present

## 2024-03-12 DIAGNOSIS — S45002A Unspecified injury of axillary artery, left side, initial encounter: Secondary | ICD-10-CM | POA: Diagnosis not present

## 2024-03-12 DIAGNOSIS — Z9889 Other specified postprocedural states: Secondary | ICD-10-CM

## 2024-03-12 DIAGNOSIS — I5042 Chronic combined systolic (congestive) and diastolic (congestive) heart failure: Secondary | ICD-10-CM

## 2024-03-12 DIAGNOSIS — I721 Aneurysm of artery of upper extremity: Secondary | ICD-10-CM

## 2024-03-12 DIAGNOSIS — Z6832 Body mass index (BMI) 32.0-32.9, adult: Secondary | ICD-10-CM | POA: Diagnosis not present

## 2024-03-12 DIAGNOSIS — Z8673 Personal history of transient ischemic attack (TIA), and cerebral infarction without residual deficits: Secondary | ICD-10-CM

## 2024-03-12 DIAGNOSIS — F1010 Alcohol abuse, uncomplicated: Secondary | ICD-10-CM | POA: Diagnosis present

## 2024-03-12 DIAGNOSIS — F32A Depression, unspecified: Secondary | ICD-10-CM | POA: Diagnosis present

## 2024-03-12 DIAGNOSIS — F419 Anxiety disorder, unspecified: Secondary | ICD-10-CM | POA: Diagnosis present

## 2024-03-12 DIAGNOSIS — J4489 Other specified chronic obstructive pulmonary disease: Secondary | ICD-10-CM | POA: Diagnosis present

## 2024-03-12 DIAGNOSIS — L7632 Postprocedural hematoma of skin and subcutaneous tissue following other procedure: Secondary | ICD-10-CM

## 2024-03-12 DIAGNOSIS — S43015A Anterior dislocation of left humerus, initial encounter: Secondary | ICD-10-CM | POA: Diagnosis present

## 2024-03-12 DIAGNOSIS — D72829 Elevated white blood cell count, unspecified: Secondary | ICD-10-CM | POA: Diagnosis present

## 2024-03-12 DIAGNOSIS — I69354 Hemiplegia and hemiparesis following cerebral infarction affecting left non-dominant side: Secondary | ICD-10-CM | POA: Diagnosis not present

## 2024-03-12 DIAGNOSIS — I10 Essential (primary) hypertension: Secondary | ICD-10-CM | POA: Diagnosis not present

## 2024-03-12 DIAGNOSIS — E1165 Type 2 diabetes mellitus with hyperglycemia: Secondary | ICD-10-CM | POA: Diagnosis present

## 2024-03-12 DIAGNOSIS — K7460 Unspecified cirrhosis of liver: Secondary | ICD-10-CM | POA: Diagnosis present

## 2024-03-12 DIAGNOSIS — E782 Mixed hyperlipidemia: Secondary | ICD-10-CM | POA: Diagnosis present

## 2024-03-12 DIAGNOSIS — E871 Hypo-osmolality and hyponatremia: Secondary | ICD-10-CM | POA: Diagnosis present

## 2024-03-12 DIAGNOSIS — Z8616 Personal history of COVID-19: Secondary | ICD-10-CM | POA: Diagnosis not present

## 2024-03-12 LAB — GLUCOSE, CAPILLARY
Glucose-Capillary: 237 mg/dL — ABNORMAL HIGH (ref 70–99)
Glucose-Capillary: 241 mg/dL — ABNORMAL HIGH (ref 70–99)
Glucose-Capillary: 189 mg/dL — ABNORMAL HIGH (ref 70–99)
Glucose-Capillary: 130 mg/dL — ABNORMAL HIGH (ref 70–99)
Glucose-Capillary: 181 mg/dL — ABNORMAL HIGH (ref 70–99)

## 2024-03-12 LAB — CBC WITH DIFFERENTIAL/PLATELET
Abs Immature Granulocytes: 0.12 K/uL — ABNORMAL HIGH (ref 0.00–0.07)
Basophils Absolute: 0 K/uL (ref 0.0–0.1)
Basophils Relative: 0 %
Eosinophils Absolute: 0 K/uL (ref 0.0–0.5)
Eosinophils Relative: 0 %
HCT: 37.7 % — ABNORMAL LOW (ref 39.0–52.0)
Hemoglobin: 12.7 g/dL — ABNORMAL LOW (ref 13.0–17.0)
Immature Granulocytes: 1 %
Lymphocytes Relative: 6 %
Lymphs Abs: 0.9 K/uL (ref 0.7–4.0)
MCH: 30.8 pg (ref 26.0–34.0)
MCHC: 33.7 g/dL (ref 30.0–36.0)
MCV: 91.3 fL (ref 80.0–100.0)
Monocytes Absolute: 0.6 K/uL (ref 0.1–1.0)
Monocytes Relative: 4 %
Neutro Abs: 14.5 K/uL — ABNORMAL HIGH (ref 1.7–7.7)
Neutrophils Relative %: 89 %
Platelets: 165 K/uL (ref 150–400)
RBC: 4.13 MIL/uL — ABNORMAL LOW (ref 4.22–5.81)
RDW: 13.3 % (ref 11.5–15.5)
WBC: 16.2 K/uL — ABNORMAL HIGH (ref 4.0–10.5)
nRBC: 0 % (ref 0.0–0.2)

## 2024-03-12 LAB — BASIC METABOLIC PANEL WITH GFR
Anion gap: 9 (ref 5–15)
BUN: 16 mg/dL (ref 6–20)
CO2: 25 mmol/L (ref 22–32)
Calcium: 8.8 mg/dL — ABNORMAL LOW (ref 8.9–10.3)
Chloride: 100 mmol/L (ref 98–111)
Creatinine, Ser: 0.82 mg/dL (ref 0.61–1.24)
GFR, Estimated: >60 mL/min (ref >60)
Glucose, Bld: 192 mg/dL — ABNORMAL HIGH (ref 70–99)
Potassium: 4.3 mmol/L (ref 3.5–5.1)
Sodium: 134 mmol/L — ABNORMAL LOW (ref 135–145)

## 2024-03-12 MED ORDER — PHENYLEPHRINE 80 MCG/ML (10ML) SYRINGE FOR IV PUSH (FOR BLOOD PRESSURE SUPPORT)
PREFILLED_SYRINGE | INTRAVENOUS | Status: DC | PRN
  Administered 2024-03-12 (×2): 240 ug via INTRAVENOUS
  Administered 2024-03-12: 160 ug via INTRAVENOUS

## 2024-03-12 MED ORDER — SUCCINYLCHOLINE CHLORIDE 200 MG/10ML IV SOSY
PREFILLED_SYRINGE | INTRAVENOUS | Status: DC | PRN
  Administered 2024-03-12: 100 mg via INTRAVENOUS

## 2024-03-12 MED ORDER — ACETAMINOPHEN 10 MG/ML IV SOLN
INTRAVENOUS | Status: DC | PRN
Start: 2024-03-12 — End: 2024-03-12
  Administered 2024-03-12: 1000 mg via INTRAVENOUS

## 2024-03-12 MED ORDER — FENTANYL CITRATE (PF) 250 MCG/5ML IJ SOLN
INTRAMUSCULAR | Status: DC | PRN
  Administered 2024-03-12: 50 ug via INTRAVENOUS

## 2024-03-12 MED ORDER — INSULIN ASPART 100 UNIT/ML IJ SOLN
6.0000 [IU] | Freq: Once | INTRAMUSCULAR | Status: AC
  Administered 2024-03-12: 6 [IU] via SUBCUTANEOUS

## 2024-03-12 MED ORDER — ASPIRIN 81 MG PO TBEC
81.0000 mg | DELAYED_RELEASE_TABLET | Freq: Every day | ORAL | Status: DC
  Administered 2024-03-12 – 2024-03-19 (×8): 81 mg via ORAL
  Filled 2024-03-12 (×8): qty 1

## 2024-03-12 MED ORDER — CLOPIDOGREL BISULFATE 75 MG PO TABS
75.0000 mg | ORAL_TABLET | Freq: Every day | ORAL | Status: DC
  Administered 2024-03-12 – 2024-03-19 (×8): 75 mg via ORAL
  Filled 2024-03-12 (×8): qty 1

## 2024-03-12 MED ORDER — SUGAMMADEX SODIUM 200 MG/2ML IV SOLN
INTRAVENOUS | Status: DC | PRN
  Administered 2024-03-12: 200 mg via INTRAVENOUS

## 2024-03-12 MED ORDER — OXYCODONE-ACETAMINOPHEN 5-325 MG PO TABS
1.0000 | ORAL_TABLET | ORAL | Status: DC | PRN
  Administered 2024-03-12: 2 via ORAL
  Administered 2024-03-12: 1 via ORAL
  Administered 2024-03-13 – 2024-03-19 (×20): 2 via ORAL
  Filled 2024-03-12 (×15): qty 2
  Filled 2024-03-12: qty 1
  Filled 2024-03-12 (×9): qty 2

## 2024-03-12 MED ORDER — SUGAMMADEX SODIUM 200 MG/2ML IV SOLN
INTRAVENOUS | Status: AC
  Filled 2024-03-12: qty 2

## 2024-03-12 MED ORDER — DEXAMETHASONE SODIUM PHOSPHATE 10 MG/ML IJ SOLN
INTRAMUSCULAR | Status: DC | PRN
  Administered 2024-03-12: 4 mg via INTRAVENOUS

## 2024-03-12 MED ORDER — ONDANSETRON HCL 4 MG/2ML IJ SOLN
INTRAMUSCULAR | Status: DC | PRN
  Administered 2024-03-12: 4 mg via INTRAVENOUS

## 2024-03-12 MED ORDER — ACETAMINOPHEN 325 MG PO TABS: 325.0000 mg | ORAL_TABLET | ORAL | Status: DC | PRN

## 2024-03-12 MED ORDER — SUCCINYLCHOLINE CHLORIDE 200 MG/10ML IV SOSY
PREFILLED_SYRINGE | INTRAVENOUS | Status: AC
  Filled 2024-03-12: qty 10

## 2024-03-12 MED ORDER — EZETIMIBE 10 MG PO TABS
10.0000 mg | ORAL_TABLET | Freq: Every day | ORAL | Status: DC
  Administered 2024-03-12 – 2024-03-19 (×8): 10 mg via ORAL
  Filled 2024-03-12 (×8): qty 1

## 2024-03-12 MED ORDER — MORPHINE SULFATE (PF) 2 MG/ML IV SOLN
2.0000 mg | INTRAVENOUS | Status: DC | PRN
  Administered 2024-03-12: 2 mg via INTRAVENOUS
  Administered 2024-03-12: 4 mg via INTRAVENOUS
  Administered 2024-03-12 – 2024-03-14 (×4): 2 mg via INTRAVENOUS
  Administered 2024-03-14: 4 mg via INTRAVENOUS
  Administered 2024-03-14: 2 mg via INTRAVENOUS
  Filled 2024-03-12 (×6): qty 1
  Filled 2024-03-12 (×2): qty 2

## 2024-03-12 MED ORDER — LIDOCAINE 2% (20 MG/ML) 5 ML SYRINGE
INTRAMUSCULAR | Status: AC
Start: 2024-03-12 — End: 2024-03-12
  Filled 2024-03-12: qty 5

## 2024-03-12 MED ORDER — AMLODIPINE BESYLATE 10 MG PO TABS
10.0000 mg | ORAL_TABLET | Freq: Every day | ORAL | Status: DC
  Administered 2024-03-12 – 2024-03-19 (×8): 10 mg via ORAL
  Filled 2024-03-12 (×8): qty 1

## 2024-03-12 MED ORDER — PHENYLEPHRINE HCL-NACL 20-0.9 MG/250ML-% IV SOLN
INTRAVENOUS | Status: DC | PRN
  Administered 2024-03-12: 60 ug/min via INTRAVENOUS

## 2024-03-12 MED ORDER — HYDRALAZINE HCL 20 MG/ML IJ SOLN: 5.0000 mg | INTRAMUSCULAR | Status: DC | PRN

## 2024-03-12 MED ORDER — HEPARIN SODIUM (PORCINE) 5000 UNIT/ML IJ SOLN
5000.0000 [IU] | Freq: Three times a day (TID) | INTRAMUSCULAR | Status: DC
  Administered 2024-03-12 – 2024-03-19 (×21): 5000 [IU] via SUBCUTANEOUS
  Filled 2024-03-12 (×21): qty 1

## 2024-03-12 MED ORDER — LACTATED RINGERS IV SOLN: INTRAVENOUS | Status: DC | PRN

## 2024-03-12 MED ORDER — HYDROMORPHONE HCL 1 MG/ML IJ SOLN: 0.2500 mg | INTRAMUSCULAR | Status: DC | PRN

## 2024-03-12 MED ORDER — ONDANSETRON HCL 4 MG/2ML IJ SOLN
4.0000 mg | Freq: Four times a day (QID) | INTRAMUSCULAR | Status: DC | PRN
Start: 2024-03-12 — End: 2024-03-19

## 2024-03-12 MED ORDER — INSULIN ASPART 100 UNIT/ML IJ SOLN
0.0000 [IU] | Freq: Three times a day (TID) | INTRAMUSCULAR | Status: DC
  Administered 2024-03-12: 1 [IU] via SUBCUTANEOUS
  Administered 2024-03-12: 3 [IU] via SUBCUTANEOUS
  Administered 2024-03-12: 2 [IU] via SUBCUTANEOUS
  Administered 2024-03-13 (×2): 1 [IU] via SUBCUTANEOUS
  Administered 2024-03-13 – 2024-03-15 (×4): 2 [IU] via SUBCUTANEOUS
  Administered 2024-03-15: 1 [IU] via SUBCUTANEOUS
  Administered 2024-03-16 – 2024-03-17 (×4): 2 [IU] via SUBCUTANEOUS
  Administered 2024-03-17: 1 [IU] via SUBCUTANEOUS
  Administered 2024-03-18: 2 [IU] via SUBCUTANEOUS
  Administered 2024-03-18: 3 [IU] via SUBCUTANEOUS
  Administered 2024-03-18: 1 [IU] via SUBCUTANEOUS
  Administered 2024-03-19: 2 [IU] via SUBCUTANEOUS

## 2024-03-12 MED ORDER — ROCURONIUM BROMIDE 10 MG/ML (PF) SYRINGE
PREFILLED_SYRINGE | INTRAVENOUS | Status: AC
  Filled 2024-03-12: qty 10

## 2024-03-12 MED ORDER — ALBUMIN HUMAN 5 % IV SOLN: INTRAVENOUS | Status: DC | PRN

## 2024-03-12 MED ORDER — ASPIRIN 81 MG PO TBEC: 81.0000 mg | DELAYED_RELEASE_TABLET | Freq: Every day | ORAL | Status: DC

## 2024-03-12 MED ORDER — HEMOSTATIC AGENTS (NO CHARGE) OPTIME
TOPICAL | Status: DC | PRN
  Administered 2024-03-12: 1 via TOPICAL

## 2024-03-12 MED ORDER — ACETAMINOPHEN 325 MG PO TABS
650.0000 mg | ORAL_TABLET | Freq: Four times a day (QID) | ORAL | Status: DC | PRN
  Administered 2024-03-16 – 2024-03-18 (×2): 650 mg via ORAL
  Filled 2024-03-12 (×2): qty 2

## 2024-03-12 MED ORDER — PROPOFOL 10 MG/ML IV BOLUS
INTRAVENOUS | Status: DC | PRN
  Administered 2024-03-12: 120 mg via INTRAVENOUS

## 2024-03-12 MED ORDER — CEFAZOLIN SODIUM-DEXTROSE 2-4 GM/100ML-% IV SOLN
2.0000 g | Freq: Three times a day (TID) | INTRAVENOUS | Status: AC
  Administered 2024-03-12 (×2): 2 g via INTRAVENOUS
  Filled 2024-03-12 (×2): qty 100

## 2024-03-12 MED ORDER — MIDAZOLAM HCL 2 MG/2ML IJ SOLN
INTRAMUSCULAR | Status: DC | PRN
  Administered 2024-03-12: 2 mg via INTRAVENOUS

## 2024-03-12 MED ORDER — CLOPIDOGREL BISULFATE 75 MG PO TABS: 75.0000 mg | ORAL_TABLET | Freq: Every day | ORAL | Status: DC

## 2024-03-12 MED ORDER — ACETAMINOPHEN 650 MG RE SUPP: 325.0000 mg | RECTAL | Status: DC | PRN

## 2024-03-12 MED ORDER — ACETAMINOPHEN 650 MG RE SUPP: 650.0000 mg | Freq: Four times a day (QID) | RECTAL | Status: DC | PRN

## 2024-03-12 MED ORDER — HEPARIN 6000 UNIT IRRIGATION SOLUTION
Status: DC | PRN
Start: 2024-03-12 — End: 2024-03-12
  Administered 2024-03-12: 1

## 2024-03-12 MED ORDER — EPHEDRINE 5 MG/ML INJ
INTRAVENOUS | Status: AC
  Filled 2024-03-12: qty 5

## 2024-03-12 MED ORDER — DEXAMETHASONE SODIUM PHOSPHATE 10 MG/ML IJ SOLN
INTRAMUSCULAR | Status: AC
  Filled 2024-03-12: qty 1

## 2024-03-12 MED ORDER — CEFAZOLIN SODIUM-DEXTROSE 2-3 GM-%(50ML) IV SOLR
INTRAVENOUS | Status: DC | PRN
Start: 2024-03-12 — End: 2024-03-12
  Administered 2024-03-12: 2 g via INTRAVENOUS

## 2024-03-12 MED ORDER — PHENYLEPHRINE 80 MCG/ML (10ML) SYRINGE FOR IV PUSH (FOR BLOOD PRESSURE SUPPORT)
PREFILLED_SYRINGE | INTRAVENOUS | Status: AC
  Filled 2024-03-12: qty 10

## 2024-03-12 MED ORDER — SODIUM CHLORIDE 0.9 % IV SOLN: INTRAVENOUS | Status: AC

## 2024-03-12 MED ORDER — ONDANSETRON HCL 4 MG/2ML IJ SOLN
INTRAMUSCULAR | Status: AC
  Filled 2024-03-12: qty 2

## 2024-03-12 MED ORDER — ROCURONIUM BROMIDE 10 MG/ML (PF) SYRINGE
PREFILLED_SYRINGE | INTRAVENOUS | Status: DC | PRN
  Administered 2024-03-12: 40 mg via INTRAVENOUS
  Administered 2024-03-12: 20 mg via INTRAVENOUS

## 2024-03-12 MED ORDER — CITALOPRAM HYDROBROMIDE 20 MG PO TABS
20.0000 mg | ORAL_TABLET | Freq: Every day | ORAL | Status: DC
  Administered 2024-03-12 – 2024-03-19 (×8): 20 mg via ORAL
  Filled 2024-03-12 (×8): qty 1

## 2024-03-12 MED ORDER — ATORVASTATIN CALCIUM 40 MG PO TABS
40.0000 mg | ORAL_TABLET | Freq: Every day | ORAL | Status: DC
  Administered 2024-03-12 – 2024-03-19 (×8): 40 mg via ORAL
  Filled 2024-03-12 (×8): qty 1

## 2024-03-12 MED ORDER — SODIUM CHLORIDE 0.9 % IR SOLN
Status: DC | PRN
  Administered 2024-03-12: 1000 mL

## 2024-03-12 MED ORDER — SODIUM CHLORIDE 0.9 % IV SOLN: 500.0000 mL | Freq: Once | INTRAVENOUS | Status: DC | PRN

## 2024-03-12 MED ORDER — LIDOCAINE 2% (20 MG/ML) 5 ML SYRINGE
INTRAMUSCULAR | Status: DC | PRN
  Administered 2024-03-12: 60 mg via INTRAVENOUS

## 2024-03-12 MED ORDER — EPHEDRINE SULFATE-NACL 50-0.9 MG/10ML-% IV SOSY
PREFILLED_SYRINGE | INTRAVENOUS | Status: DC | PRN
  Administered 2024-03-12: 5 mg via INTRAVENOUS

## 2024-03-12 NOTE — Progress Notes (Signed)
 Interval events noted.  He complains of soreness in the left axilla area but he is feeling much better today.  He also complains of numbness and some weakness in the left hand no dizziness, shortness of breath or chest pain.  Randy Hebert is a 58 y.o. male with history of diabetes mellitus type 2, pretension, CAD, liver cirrhosis, anxiety, depression, OSA on CPAP, recent admission for stroke with left-sided weakness discharged on 03/03/2024 was brought to the ER after patient had a fall after tripping on a screw   He is s/p repair of left axillary artery pseudoaneurysm on 03/12/2024. Leukocytosis improving, WBC down from 24.5-16.2. Acute blood loss anemia: Hemoglobin down from 18-12.7.  No indication for blood transfusion at this time. Follow-up with vascular surgeon for further recommendations.

## 2024-03-12 NOTE — Anesthesia Postprocedure Evaluation (Signed)
 Anesthesia Post Note  Patient: Randy Hebert  Procedure(s) Performed: REPAIR LEFT AXILLARY ARTERY (Left: Axilla)     Patient location during evaluation: PACU Anesthesia Type: General Level of consciousness: awake and alert Pain management: pain level controlled Vital Signs Assessment: post-procedure vital signs reviewed and stable Respiratory status: spontaneous breathing, nonlabored ventilation and respiratory function stable Cardiovascular status: blood pressure returned to baseline and stable Postop Assessment: no apparent nausea or vomiting Anesthetic complications: no   No notable events documented.  Last Vitals:  Vitals:   03/12/24 0415 03/12/24 0435  BP: 106/68 112/77  Pulse: 75 78  Resp: 16 18  Temp: 36.5 C (!) 36.4 C  SpO2: 92% 93%    Last Pain:  Vitals:   03/12/24 0435  TempSrc:   PainSc: 0-No pain                 Clarissa Laird,W. EDMOND

## 2024-03-12 NOTE — H&P (Signed)
 History and Physical    Randy Hebert FMW:[id number] DOB: 1965/09/15 DOA: 03/11/2024  Patient coming from: Home.  Chief Complaint: Fall.  HPI: Randy Hebert is a 58 y.o. male with history of diabetes mellitus type 2, CAD, recent admission for stroke with left-sided weakness discharged on 03/03/2024 was brought to the ER after patient had a fall after tripping on a screw.  Denies hitting his head.  Denies losing consciousness.  ED Course: In the ER patient was found to have left shoulder dislocation which was reduced following which patient's pain worsened and had a CT scan done which shows left axillary hematoma with active extravasation.  Vascular surgery Dr. Serene was consulted and patient was taken to surgery.  Hospitalist was requested admission.  Labs show leukocytosis of 24.5 creatinine is 0.85 and blood glucose 223.  At the time of my exam patient is postop.  Denies any chest pain or shortness of breath.  Review of Systems: As per HPI, rest all negative.   Past Medical History:  Diagnosis Date   ACE inhibitor-aggravated angioedema    Allergy     Anemia    Angio-edema    Anxiety    Arthritis    Asthma    hip replacement   Back pain    Bradycardia 04/28/2017   Bulging of cervical intervertebral disc    CAD (coronary artery disease)    lateral STEMI 02/06/2015 00% D1 occlusion treated with Promus Premier 2.5 mm x 16 mm DES, 70% ramus stenosis, 40% mid RCA stenosis, 45% distal RCA stenosis, EF 45-50%   CHF (congestive heart failure) (HCC)    COPD (chronic obstructive pulmonary disease) (HCC)    Depression    Diabetes mellitus without complication (HCC)    Difficult intubation    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   Dry eye    Dyspnea    Early satiety 09/23/2016   Fatty liver    GERD (gastroesophageal reflux disease)    HCAP (healthcare-associated pneumonia) 05/15/2017   Headache    Heart murmur    Hip pain    Hyperlipidemia    Hypertension     Lobar pneumonia (HCC) 05/15/2017   Melena 08/04/2018   MI (myocardial infarction) [facility])    Myocardial infarction (HCC)    Neck pain    Non-ST elevation (NSTEMI) myocardial infarction (HCC) 04/27/2017   NSTEMI (non-ST elevated myocardial infarction) (HCC) 04/26/2017   Otitis media    Pleurisy    Pneumonia due to COVID-19 virus 10/07/2019   Rectal bleeding 11/08/2018   Right shoulder pain 03/20/2020   Sinus pause    9 sec sinus pause on telemetry after started on coreg  after MI, avoid AV nodal blocking agent   Sleep apnea    uses a cpap   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   STEMI (ST elevation myocardial infarction) (HCC) 05/31/2015   Substance abuse (HCC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   Syncope 06/29/2017   Syncope and collapse 05/15/2017   Transaminitis 08/04/2018   Unilateral primary osteoarthritis, right hip 07/01/2016    Past Surgical History:  Procedure Laterality Date   BIOPSY  10/09/2016   Procedure: BIOPSY;  Surgeon: Lamar CHRISTELLA Hollingshead, MD;  Location: [facility];  Service: Endoscopy;;   BIOPSY  11/28/2019   Procedure: BIOPSY;  Surgeon: Hollingshead Lamar CHRISTELLA, MD;  Location: [facility];  Service: Endoscopy;;   BIOPSY  06/15/2023   Procedure: BIOPSY;  Surgeon: Hollingshead Lamar CHRISTELLA, MD;  Location: [facility];  Service: Endoscopy;;   BRONCHIAL BIOPSY  10/16/2022   Procedure: BRONCHIAL BIOPSIES;  Surgeon: Gladis Leonor HERO, MD;  Location: [facility] ENDOSCOPY;  Service: Pulmonary;;   BRONCHIAL NEEDLE ASPIRATION BIOPSY  10/16/2022   Procedure: BRONCHIAL NEEDLE ASPIRATION BIOPSIES;  Surgeon: Gladis Leonor HERO, MD;  Location: [facility] ENDOSCOPY;  Service: Pulmonary;;   BRONCHIAL WASHINGS  10/16/2022   Procedure: BRONCHIAL WASHINGS;  Surgeon: Gladis Leonor HERO, MD;  Location: [facility] ENDOSCOPY;  Service: Pulmonary;;   CARDIAC CATHETERIZATION N/A 02/06/2015   Procedure: Left Heart Cath and Coronary Angiography;  Surgeon: Alm LELON Clay, MD;  Location: [facility] INVASIVE CV LAB;  Service: Cardiovascular;   Laterality: N/A;   CARDIAC CATHETERIZATION N/A 02/06/2015   Procedure: Coronary Stent Intervention;  Surgeon: Alm LELON Clay, MD;  Location: [facility] INVASIVE CV LAB;  Service: Cardiovascular;  Laterality: N/A;   COLONOSCOPY WITH PROPOFOL  N/A 10/09/2016   Sigmoid and descending colon diverticulosis, four 4-6 mm polyps in sigmoid, one 4 mm polyp in descending. Tubular adenomas and hyperplastic. 5 year surveillance.    COLONOSCOPY WITH PROPOFOL  N/A 11/24/2016   Sigmoid and descending colon diverticulosis, four 4-6 mm polyps in sigmoid, one 4 mm polyp in descending. Tubular adenomas and hyperplastic. 5 year surveillance.    COLONOSCOPY WITH PROPOFOL  N/A 10/18/2021   Carver: nonbleeding internal hemorrhoids, small and large mouth diverticula found in the sigmoid, descending, transverse colon.  A 9 mm sessile polyp was found in the ascending colon that was removed.  4 sessile polyps found in the sigmoid, descending, transverse colon 3 to 5 mm in size, examining otherwise.  Path revealed tubular adenomas.  Repeat due in 5 years for surveillance.   CORONARY ANGIOPLASTY WITH STENT PLACEMENT  01/2015   CORONARY STENT INTERVENTION N/A 04/27/2017   Procedure: CORONARY STENT INTERVENTION;  Surgeon: Mady Bruckner, MD;  Location: [facility] INVASIVE CV LAB;  Service: Cardiovascular;  Laterality: N/A;   ELECTROPHYSIOLOGY STUDY N/A 06/29/2017   Procedure: ELECTROPHYSIOLOGY STUDY;  Surgeon: Waddell Danelle LELON, MD;  Location: [facility] INVASIVE CV LAB;  Service: Cardiovascular;  Laterality: N/A;   ESOPHAGOGASTRODUODENOSCOPY (EGD) WITH PROPOFOL  N/A 10/09/2016   Dr. Shaaron: LA grade a esophagitis.  Barrett's esophagus, biopsy-proven.  Small hiatal hernia.  EGD February 2019   ESOPHAGOGASTRODUODENOSCOPY (EGD) WITH PROPOFOL  N/A 11/28/2019    salmon-colored esophageal mucosa (Barrett's) small hiatal hernia, portal hypertensive gastropathy, normal duodenum, 3 year surveillance   ESOPHAGOGASTRODUODENOSCOPY (EGD) WITH PROPOFOL  N/A 09/06/2021    three columns of grade 1 varices in distal esophagus, no stigmata of bleeding or red wale signs. Small hiatal hernia. Mild portal gastropathy. Normal duodenum. Repeat upper endoscopy in 1 year for surveillance.   ESOPHAGOGASTRODUODENOSCOPY (EGD) WITH PROPOFOL  N/A 06/15/2023   Procedure: ESOPHAGOGASTRODUODENOSCOPY (EGD) WITH PROPOFOL ;  Surgeon: Shaaron Lamar HERO, MD;  Location: [facility];  Service: Endoscopy;  Laterality: N/A;  830am, asa 3   FIDUCIAL MARKER PLACEMENT  10/16/2022   Procedure: FIDUCIAL MARKER PLACEMENT;  Surgeon: Gladis Leonor HERO, MD;  Location: [facility] ENDOSCOPY;  Service: Pulmonary;;   INCISION / DRAINAGE HAND / FINGER     LEFT HEART CATH AND CORONARY ANGIOGRAPHY N/A 04/27/2017   Procedure: LEFT HEART CATH AND CORONARY ANGIOGRAPHY;  Surgeon: Mady Bruckner, MD;  Location: [facility] INVASIVE CV LAB;  Service: Cardiovascular;  Laterality: N/A;   LOOP RECORDER INSERTION  06/29/2017   Procedure: Loop Recorder Insertion;  Surgeon: Waddell Danelle LELON, MD;  Location: [facility] INVASIVE CV LAB;  Service: Cardiovascular;;   POLYPECTOMY  11/24/2016   Procedure: POLYPECTOMY;  Surgeon: Lamar HERO Shaaron,  MD;  [facility]: [facility];  Service: Endoscopy;;  descending and sigmoid   POLYPECTOMY  10/18/2021   Procedure: POLYPECTOMY;  Surgeon: Cindie Carlin POUR, DO;  Location: [facility];  Service: Endoscopy;;   TOTAL HIP ARTHROPLASTY Right 07/01/2016   TOTAL HIP ARTHROPLASTY Right 07/01/2016   Procedure: RIGHT TOTAL HIP ARTHROPLASTY ANTERIOR APPROACH;  Surgeon: Lonni CINDERELLA Poli, MD;  Location: [facility] OR;  Service: Orthopedics;  Laterality: Right;     reports that he has been smoking cigarettes. He has a 46 pack-year smoking history. He has never been exposed to tobacco smoke. He has quit using smokeless tobacco.  His smokeless tobacco use included chew. He reports current alcohol  use. He reports that he does not use drugs.  Allergies  Allergen Reactions   Carvedilol  Other (See Comments)    Sinus pause on  telemetry >3 seconds. Longest one 9 sec. No AV nodal agent   Lisinopril  Anaphylaxis, Shortness Of Breath and Swelling    Angioedema, required intubation and mechanical ventilation   Chantix  [Varenicline ] Other (See Comments)    Nightmares and unable to sleep well   Amoxicillin  Nausea Only    Childhood reaction     Family History  Problem Relation Age of Onset   Heart attack Father    Stroke Father    Arthritis Father    Heart disease Father    Cancer Mother        ???   Arthritis Mother    Heart disease Brother 35       died in sleep   Early death Brother    Diabetes Maternal Uncle    Alzheimer's disease Maternal Grandmother     Prior to Admission medications   Medication Sig Start Date End Date Taking? Authorizing Provider  ILEVRO 0.3 % ophthalmic suspension Place 1 drop into the left eye daily. 03/08/24  Yes [provider]  moxifloxacin (VIGAMOX) 0.5 % ophthalmic solution Place 1 drop into the left eye 3 (three) times daily. 03/08/24  Yes [provider]  prednisoLONE acetate (PRED FORTE) 1 % ophthalmic suspension Place 1 drop into the left eye See admin instructions. Instill 1 drop into left eye three times a day for 1 week, then twice a day for 3 weeks. Starting 03/19/24 03/08/24  Yes [provider]  acetaminophen  (TYLENOL ) 500 MG tablet Take 2 tablets (1,000 mg total) by mouth every 8 (eight) hours as needed for mild pain or fever. 09/06/21   Jens Durand, MD  amLODipine  (NORVASC ) 10 MG tablet Take 1 tablet (10 mg total) by mouth daily. 10/14/23   Alvan Dorn FALCON, MD  ascorbic acid  (VITAMIN C ) 500 MG tablet Take 1 tablet (500 mg total) by mouth daily. 10/12/19   Ricky Fines, MD  aspirin  EC 81 MG tablet Take 1 tablet (81 mg total) by mouth daily. Swallow whole. 03/03/24   Maree, Pratik D, DO  atorvastatin  (LIPITOR ) 40 MG tablet Take 1 tablet (40 mg total) by mouth daily. 12/09/23   Alvan Dorn FALCON, MD  Cholecalciferol  (VITAMIN D3) 125 MCG (5000 UT) CAPS  Take 1 capsule (5,000 Units total) by mouth daily. 07/19/19   Nida, Gebreselassie W, MD  citalopram  (CELEXA ) 20 MG tablet Take 1 tablet (20 mg total) by mouth daily. 01/04/24   Cook, Jayce G, DO  clopidogrel  (PLAVIX ) 75 MG tablet Take 1 tablet (75 mg total) by mouth daily for 21 days. 03/04/24 03/25/24  Maree, Pratik D, DO  dapagliflozin  propanediol (FARXIGA ) 10 MG TABS tablet Take 1 tablet (  10 mg total) by mouth daily. 12/10/23   Nida, Gebreselassie W, MD  ezetimibe  (ZETIA ) 10 MG tablet Take 1 tablet (10 mg total) by mouth daily. 10/14/23   Alvan Dorn FALCON, MD  ferrous sulfate  325 (65 FE) MG tablet Take 1 tablet (325 mg total) by mouth daily with breakfast. 04/02/21   Elnor Fairy HERO, NP  magnesium  gluconate (MAGONATE) 500 MG tablet Take 500 mg by mouth 2 (two) times daily.    [provider]  metFORMIN  (GLUCOPHAGE ) 500 MG tablet Take 1 tablet (500 mg total) by mouth 2 (two) times daily with a meal. 10/15/23   Nida, Ethelle ORN, MD  naproxen  (NAPROSYN ) 250 MG tablet Take 250 mg by mouth every 6 (six) hours as needed for mild pain (pain score 1-3).    [provider]  nitroGLYCERIN  (NITROSTAT ) 0.4 MG SL tablet Place 1 tablet (0.4 mg total) under the tongue every 5 (five) minutes x 3 doses as needed (if no relief after 3rd dose, proceed to ED or call 911). 05/18/23   Alvan Dorn FALCON, MD  pantoprazole  (PROTONIX ) 40 MG tablet TAKE (1) TABLET BY MOUTH TWICE DAILY BEFORE A MEAL. 01/11/24   Rourk, Lamar HERO, MD  RESTASIS  0.05 % ophthalmic emulsion Place 1 drop into both eyes 2 (two) times daily. 02/26/24   [provider]  SYMBICORT  160-4.5 MCG/ACT inhaler INHALE 2 PUFFS INTO THE LUNGS FIRST THING IN THE MORNING AND 2 PUFFS ABOUT 12 HOURS LATER. 12/17/23   Cook, Jayce G, DO  TRULICITY  1.5 MG/0.5ML SOAJ Inject 1.5 mg into the skin once a week. 01/18/24   Lenis Ethelle ORN, MD    Physical Exam: Constitutional: Moderately built and nourished. Vitals:   03/12/24 0300 03/12/24 0315  03/12/24 0330 03/12/24 0345  BP: 121/65 114/70 120/81 104/71  Pulse: 82 81 84 77  Resp: 17 18 17 17   Temp:      TempSrc:      SpO2: (!) 89% 91% 90% 91%  Weight:      Height:       Eyes: Anicteric no pallor. ENMT: No discharge from the ears eyes nose and mouth. Neck: No mass felt.  No neck rigidity. Respiratory: No rhonchi or crepitations. Cardiovascular: S1-S2 heard. Abdomen: Soft nontender bowel sound present. Musculoskeletal: Left upper extremity is in dressing. Skin: No rash. Neurologic: Alert awake oriented to time place and person.  Moves all extremities. Psychiatric: Appears normal.  Normal affect.   Labs on Admission: I have personally reviewed following labs and imaging studies  CBC: Recent Labs  Lab 03/11/24 1613  WBC 24.5*  NEUTROABS 21.0*  HGB 18.0*  HCT 51.5  MCV 91.2  PLT 274   Basic Metabolic Panel: Recent Labs  Lab 03/11/24 1613  NA 136  K 3.6  CL 99  CO2 20*  GLUCOSE 223*  BUN 12  CREATININE 0.85  CALCIUM  9.3   GFR: Estimated Creatinine Clearance: 95.2 mL/min (by C-G formula based on SCr of 0.85 mg/dL). Liver Function Tests: Recent Labs  Lab 03/11/24 1613  AST 36  ALT 46*  ALKPHOS 101  BILITOT 1.4*  PROT 7.8  ALBUMIN  4.0   No results for input(s): LIPASE, AMYLASE in the last 168 hours. No results for input(s): AMMONIA in the last 168 hours. Coagulation Profile: No results for input(s): INR, PROTIME in the last 168 hours. Cardiac Enzymes: No results for input(s): CKTOTAL, CKMB, CKMBINDEX, TROPONINI in the last 168 hours. BNP (last 3 results) No results for input(s): PROBNP in the last  8760 hours. HbA1C: No results for input(s): HGBA1C in the last 72 hours. CBG: Recent Labs  Lab 03/12/24 0222  GLUCAP 237*   Lipid Profile: No results for input(s): CHOL, HDL, LDLCALC, TRIG, CHOLHDL, LDLDIRECT in the last 72 hours. Thyroid  Function Tests: No results for input(s): TSH, T4TOTAL, FREET4,  T3FREE, THYROIDAB in the last 72 hours. Anemia Panel: No results for input(s): VITAMINB12, FOLATE, FERRITIN, TIBC, IRON, RETICCTPCT in the last 72 hours. Urine analysis:    Component Value Date/Time   COLORURINE YELLOW 09/12/2023 0725   APPEARANCEUR CLEAR 09/12/2023 0725   LABSPEC 1.026 09/12/2023 0725   PHURINE 7.0 09/12/2023 0725   GLUCOSEU >=500 (A) 09/12/2023 0725   HGBUR NEGATIVE 09/12/2023 0725   BILIRUBINUR negative 01/04/2024 1140   KETONESUR negative 01/04/2024 1140   KETONESUR NEGATIVE 09/12/2023 0725   PROTEINUR NEGATIVE 09/12/2023 0725   UROBILINOGEN 0.2 01/04/2024 1140   UROBILINOGEN 0.2 05/30/2015 1628   NITRITE Positive (A) 01/04/2024 1140   NITRITE NEGATIVE 09/12/2023 0725   LEUKOCYTESUR Small (1+) (A) 01/04/2024 1140   LEUKOCYTESUR NEGATIVE 09/12/2023 0725   Sepsis Labs: @LABRCNTIP (procalcitonin:4,lacticidven:4) )No results found for this or any previous visit (from the past 240 hours).   Radiological Exams on Admission: CT ANGIO UP EXTREM LEFT W &/OR WO CONTAST Result Date: 03/11/2024 CLINICAL DATA:  Recent shoulder dislocation with surrounding muscular hematoma, initial encounter EXAM: CT ANGIOGRAPHY OF THE LEFT UPPEREXTREMITY TECHNIQUE: Multidetector CT imaging of the left upper extremitywas performed using the standard protocol during bolus administration of intravenous contrast. Multiplanar CT image reconstructions and MIPs were obtained to evaluate the vascular anatomy. RADIATION DOSE REDUCTION: This exam was performed according to the departmental dose-optimization program which includes automated exposure control, adjustment of the mA and/or kV according to patient size and/or use of iterative reconstruction technique. CONTRAST:  75mL OMNIPAQUE  IOHEXOL  350 MG/ML SOLN COMPARISON:  CT of the shoulder from earlier in the same day. FINDINGS: Vascular: Right aortic arch is noted. The subclavian artery is patent throughout its course. The axillary  artery is somewhat attenuated secondary to extrinsic compression by the muscular hematoma seen previously. The more distal brachial radial and ulnar arteries are patent. The previously described intramuscular hematoma is again seen within the subscapularis. The soft tissue hematoma in the left axilla is again identified as well with central extravasation identified. Given the lack of venous opacification on the left, this likely represents a small arterial hemorrhage. The overall hematoma measures approximately 6.7 x 5.3 cm in greatest transverse and AP dimensions not significantly changed from the prior exam. Attenuation of the lateral thoracic artery is noted as well related to extrinsic compression by the hematoma. Some mild subcutaneous extension of hemorrhage along the lateral chest wall is again seen similar to that noted on the prior exam. Nonvascular: Significant degenerative changes of the glenohumeral and acromioclavicular joints are seen. No definitive rib fracture is identified. Visualized structures in the chest and abdomen on the margin of the scan appear within normal limits. Review of the MIP images confirms the above findings. IMPRESSION: Attenuation of the left axillary artery secondary to extrinsic compression. No definitive dissection is identified. Active hemorrhage is noted within the left axillary hematoma likely related to a small branch of the lateral thoracic artery beneath the axillary hematoma. This is best visualized on the coronal imaging (image number 62 of series 10). Critical Value/emergent results were called by telephone at the time of interpretation on 03/11/2024 at 9:48 pm to Dr. SID BONING , who verbally acknowledged these results.  Electronically Signed   By: Oneil Hebert M.D.   On: 03/11/2024 21:51   CT Shoulder Left Wo Contrast Result Date: 03/11/2024 CLINICAL DATA:  Shoulder trauma, fracture of humerus or scapula. Fall, left shoulder injury EXAM: CT OF THE UPPER LEFT  EXTREMITY WITHOUT CONTRAST TECHNIQUE: Multidetector CT imaging of the upper left extremity was performed according to the standard protocol. RADIATION DOSE REDUCTION: This exam was performed according to the departmental dose-optimization program which includes automated exposure control, adjustment of the mA and/or kV according to patient size and/or use of iterative reconstruction technique. COMPARISON:  None Available. FINDINGS: Bones/Joint/Cartilage Normal alignment. No acute fracture or dislocation. Mild acromioclavicular and moderate glenohumeral degenerative arthritis. Degenerative changes are seen within the cervical spine, not optimally profiled on this examination, but with advanced degenerative disc disease at C4-T1 with severe left neuroforaminal narrowing at these levels. Ligaments Suboptimally assessed by CT. Muscles and Tendons There is marked fatty atrophy of the supraspinatus and infraspinatus musculature. Moderate fatty atrophy of the subscapularis musculature. There is hyperdensity within the residual muscle fibers of the subscapularis with associated thickening, best appreciated on 55/8 and 59/10 in keeping with an intramuscular hematoma. There is a lobulated hyperdense collection noted within the interstitial soft tissues of the left axilla measuring roughly 5.4 x 6.0 x 9.5 cm (84/10, 56/8) in keeping with a acute hematoma. There is interstitial hemorrhage noted surrounding this within the left axilla seen tracking into the subclavicular region and inferiorly along the left chest wall. Soft tissues See above.  Left shoulder effusion is present. IMPRESSION: 1. No acute fracture or dislocation. 2. Moderate glenohumeral degenerative arthritis. 3. Marked fatty atrophy of the supraspinatus and infraspinatus musculature. Moderate fatty atrophy of the subscapularis musculature. 4. Intramuscular hematoma within the residual muscle fibers of the subscapularis. 5. Acute hematoma within the left axilla  measuring roughly 5.4 x 6.0 x 9.5 cm. Surrounding interstitial hemorrhage with left axilla and chest wall. 6. Extensive degenerative disc disease at C4-T1 with severe left neuroforaminal narrowing at these levels. Electronically Signed   By: Dorethia Molt M.D.   On: 03/11/2024 19:17   DG Chest Portable 1 View Result Date: 03/11/2024 CLINICAL DATA:  Shortness of breath EXAM: PORTABLE CHEST 1 VIEW COMPARISON:  Chest radiograph dated 03/24/2023 FINDINGS: Implanted loop recorder projects over the medial left lung base. Metallic clip projects over the right apex. Normal lung volumes. Left basilar patchy opacities. No pleural effusion or pneumothorax. Right aortic arch. Similar enlarged cardiomediastinal silhouette. No acute osseous abnormality. IMPRESSION: 1. Left basilar patchy opacities, which may represent atelectasis, aspiration, or pneumonia. 2. Similar enlarged cardiomediastinal silhouette. 3. Right aortic arch. Electronically Signed   By: Limin  Xu M.D.   On: 03/11/2024 18:57   DG Shoulder Left Result Date: 03/11/2024 CLINICAL DATA:  Tripped over screw in apartment and landed on left shoulder. EXAM: LEFT SHOULDER - 2+ VIEW COMPARISON:  Same-day radiograph at 2:57 p.m. FINDINGS: Successful reduction of the left shoulder dislocation. Curvilinear lucency along the inferior glenoid suspicious for nondisplaced fracture. Degenerative changes AC and glenohumeral joints. IMPRESSION: 1. Successful reduction of the left glenohumeral dislocation. 2. Curvilinear lucency along the inferior glenoid suspicious for nondisplaced fracture. CT is recommended for further evaluation. Electronically Signed   By: Norman Gatlin M.D.   On: 03/11/2024 17:38   DG Shoulder Left Result Date: 03/11/2024 CLINICAL DATA:  Left shoulder pain following a fall. EXAM: LEFT SHOULDER - 2+ VIEW COMPARISON:  None Available. FINDINGS: Anterior, inferior dislocation of the humeral head relative to the  acromion. Moderate humeral head and neck  junction spur formation. No fracture seen. IMPRESSION: Anterior, inferior dislocation of the humeral head. Electronically Signed   By: Elspeth Bathe M.D.   On: 03/11/2024 15:28    EKG: Independently reviewed.  Normal sinus rhythm.  Assessment/Plan Principal Problem:   Axillary artery injury Active Problems:   Essential hypertension, benign   CAD (coronary artery disease)   Mixed hyperlipidemia   OSA (obstructive sleep apnea)   Chronic combined systolic and diastolic CHF (congestive heart failure) (HCC)   DM type 2 causing vascular disease (HCC)   Alcohol  abuse   Cirrhosis of liver (HCC)   Tobacco abuse   Axillary artery injury, left, initial encounter    Left axillary artery hematoma with active extravasation was taken to surgery by Dr. Serene.  Further recommendation per vascular surgery. Recent stroke with left-sided weakness will await further recommendation from vascular surgery to see if patient be continued on antiplatelet agents.  Will continue statins. Hypertension on amlodipine . Diabetes mellitus type II last hemoglobin A1c was 6.7.  Takes Trulicity  metformin  and dapagliflozin .  Presently on sliding scale coverage. History of CAD denies any chest pain. OSA on CPAP at bedtime. Cirrhosis of liver appears compensated. Anxiety and depression on citalopram .  Since patient has left axillary hematoma with active extubation requiring surgery will need close monitoring and further workup and more than 2 midnight stay.   DVT prophylaxis: SCDs. Code Status: Full code. Family Communication: Discussed with patient. Disposition Plan: Medical floor. Consults called: Vascular surgery. Admission status: Inpatient.

## 2024-03-12 NOTE — Op Note (Signed)
    Patient name: Randy Hebert MRN: [id number] DOB: 1965-09-15 Sex: male  03/12/2024 Pre-operative Diagnosis: Left axillary pseudoaneurysm Post-operative diagnosis:  Same Surgeon:  Malvina New Assistants:  Adina Sender, PA Procedure:   #1: Open exposure and repair of left axillary pseudoaneurysm   #2: Evacuation of left axillary hematoma Anesthesia:  General Blood Loss:  minimal Specimens:  none  Findings:  axillary artery exposed from the axilla back to the rib cage.  A side branch was bleeding and was suture ligated  Indications:  This is a 58 year old male with history of recent stroke, who came to the [facility] emergency department after a fall.  He had dislocated his shoulder which was reduced.  He developed swelling and pain in the left chest and axillary region prompting a CT scan which showed a pseudoaneurysm with active extravasation.  He was transferred to [facility] for surgical repair  Procedure:  The patient was identified in the holding area and taken to [facility] OR ROOM 11  The patient was then placed supine on the table. general anesthesia was administered.  The patient was prepped and draped in the usual sterile fashion.  A time out was called and antibiotics were administered.  A PA was necessary to expedite the procedure and assist with technical details.  He helped with exposure by providing suction and retraction.  He helped with wound closure.  A curvilinear hairline incision was made in the left axilla.  Cautery was used to divide subcutaneous tissue down to the fascia which was opened with cautery.  There was obvious hematoma in the axillary sheath.  This was opened sharply.  Identified the nerve artery and vein.  The artery was circumferentially dissected out.  It was disease-free.  I then proceeded to dissect proximally back towards the rib cage.  Several branches were ligated with metal clips.  There was bleeding off of the base of one of the branches which was repaired with a  5-0 Prolene.  I then entered the hematoma which extended onto the chest.  I did have some difficulty dissecting the axillary artery back to the rib cage and so I made a infraclavicular incision for better exposure.  I opened the fascia and then separated the muscle fibers so that I could visualize the artery and then traced it back to the rib cage.  No further bleeding was identified.  I evacuated the hematoma.  There was diffuse oozing from raw surface areas likely from his Plavix  use.  I irrigated the wound.  There was no further active bleeding.  The wound was infiltrated with Surgiflo.  The infraclavicular incision was closed by reapproximating the fascia with 2-0 Vicryl and the skin with staples.  I then placed a 19 Blake drain into the incision and brought it out through a stab incision.  The drain was sutured in place with 3-0 nylon.  I then closed the axillary incision by reapproximating the deep tissue with 2-0 Vicryl and the skin with 4 Monocryl.  Dermabond was applied.  There were no immediate complications.   Disposition: To PACU stable.   ALONSO Malvina New, M.D., [facility] [place] Office: [phone] Pager:  [phone]

## 2024-03-12 NOTE — Progress Notes (Signed)
    Subjective  - POD # 1, s/p repair of left axillary pseudoaneurysm  Pain is much better today.  Complaining of left arm weakness which is not different from previous surgery   Physical Exam:  Palpable left radial pulse Incisions are without hematoma   JP with 80 cc   Assessment/Plan:  POD # 1  Status post repair of left axillary artery pseudoaneurysm: Continue JP bulb to suction and monitor output.  The patient has left arm weakness which is new following his shoulder dislocation.  He did have baseline weakness in the left arm from his recent stroke however is on is worse since dislocating his shoulder.  There is a possibility of a traction injury to the nerve.  His arm is no worse following surgical repair and so I do not think there was any intraoperative nerve damage.  Continue to monitor.  Patient's arm is in a sling.  Okay to resume aspirin  and Plavix   Wells Masey Scheiber 03/12/2024 9:57 AM --  Vitals:   03/12/24 0435 03/12/24 0744  BP: 112/77 129/80  Pulse: 78 88  Resp: 18 16  Temp: (!) 97.5 F (36.4 C) (!) 97.4 F (36.3 C)  SpO2: 93% 92%    Intake/Output Summary (Last 24 hours) at 03/12/2024 0957 Last data filed at 03/12/2024 0845 Gross per 24 hour  Intake 1660 ml  Output 290 ml  Net 1370 ml     Laboratory CBC    Component Value Date/Time   WBC 16.2 (H) 03/12/2024 0643   HGB 12.7 (L) 03/12/2024 0643   HGB 17.1 10/28/2023 0809   HCT 37.7 (L) 03/12/2024 0643   HCT 50.7 10/28/2023 0809   PLT 165 03/12/2024 0643   PLT 175 10/28/2023 0809    BMET    Component Value Date/Time   NA 134 (L) 03/12/2024 0643   NA 138 10/28/2023 0809   K 4.3 03/12/2024 0643   CL 100 03/12/2024 0643   CO2 25 03/12/2024 0643   GLUCOSE 192 (H) 03/12/2024 0643   BUN 16 03/12/2024 0643   BUN 15 10/28/2023 0809   CREATININE 0.82 03/12/2024 0643   CREATININE 0.68 (L) 09/29/2022 0836   CALCIUM  8.8 (L) 03/12/2024 0643   GFRNONAA >60 03/12/2024 0643   GFRNONAA 108 06/21/2020 0707    GFRAA 127 10/24/2020 0811   GFRAA 125 06/21/2020 0707    COAG Lab Results  Component Value Date   INR 1.0 03/02/2024   INR 1.1 10/28/2023   INR 1.1 05/05/2023   No results found for: PTT  Antibiotics Anti-infectives (From admission, onward)    None        V. Malvina Serene CLORE, M.D., [facility] [place] Office: [phone] Pager:  [phone]

## 2024-03-12 NOTE — Plan of Care (Signed)
   Problem: Education: Goal: Knowledge of General Education information will improve Description Including pain rating scale, medication(s)/side effects and non-pharmacologic comfort measures Outcome: Progressing

## 2024-03-12 NOTE — Transfer of Care (Signed)
 Immediate Anesthesia Transfer of Care Note  Patient: Randy Hebert  Procedure(s) Performed: REPAIR LEFT AXILLARY ARTERY (Left: Axilla)  Patient Location: PACU  Anesthesia Type:General  Level of Consciousness: drowsy  Airway & Oxygen  Therapy: Patient Spontanous Breathing and Patient connected to face mask oxygen   Post-op Assessment: Report given to RN and Post -op Vital signs reviewed and stable  Post vital signs: Reviewed and stable  Last Vitals:  Vitals Value Taken Time  BP 136/72 03/12/24 02:17  Temp    Pulse 82 03/12/24 02:23  Resp 18 03/12/24 02:23  SpO2 92 % 03/12/24 02:23  Vitals shown include unfiled device data.  Last Pain:  Vitals:   03/11/24 2343  TempSrc:   PainSc: 10-Worst pain ever         Complications: No notable events documented.

## 2024-03-12 NOTE — Progress Notes (Signed)
 Orthopedic Tech Progress Note Patient Details:  Randy Hebert 04-18-1966 [id number]  Patient ID: Randy Hebert, male   DOB: [birth year], 58 y.o.   MRN: [id number] Went up to surgery not needed.  Randy Hebert L [name] 03/12/2024, 12:20 AM

## 2024-03-12 NOTE — Anesthesia Procedure Notes (Signed)
 Procedure Name: Intubation Date/Time: 03/12/2024 12:28 AM  Performed by: Roddie Grate, CRNAPre-anesthesia Checklist: Patient identified, Emergency Drugs available, Suction available, Patient being monitored and Timeout performed Patient Re-evaluated:Patient Re-evaluated prior to induction Oxygen  Delivery Method: Circle system utilized Preoxygenation: Pre-oxygenation with 100% oxygen  Induction Type: IV induction, Rapid sequence and Cricoid Pressure applied Laryngoscope Size: Glidescope and 4 Grade View: Grade I Tube type: Oral Tube size: 7.5 mm Number of attempts: 1 Airway Equipment and Method: Video-laryngoscopy and Stylet Placement Confirmation: ETT inserted through vocal cords under direct vision, positive ETCO2 and breath sounds checked- equal and bilateral Secured at: 23 cm Tube secured with: Tape Dental Injury: Teeth and Oropharynx as per pre-operative assessment  Difficulty Due To: Difficulty was anticipated, Difficult Airway- due to reduced neck mobility and Difficult Airway- due to limited oral opening Comments: Smooth IV Induction. Eyes taped. RSI Performed. DL x 1 with grade 1 view. Atraumatically placed, teeth and lip remain intact as pre-op. Secured with tape. Bilateral breath sounds +/=, EtCO2 +, Adequate TV, VSS.

## 2024-03-12 NOTE — ED Notes (Signed)
 Randy Hebert- son [phone]

## 2024-03-12 NOTE — Plan of Care (Signed)
  Problem: Education: Goal: Knowledge of General Education information will improve Description: Including pain rating scale, medication(s)/side effects and non-pharmacologic comfort measures Outcome: Progressing   Problem: Health Behavior/Discharge Planning: Goal: Ability to manage health-related needs will improve Outcome: Progressing   Problem: Clinical Measurements: Goal: Ability to maintain clinical measurements within normal limits will improve Outcome: Progressing Goal: Will remain free from infection Outcome: Progressing Goal: Cardiovascular complication will be avoided Outcome: Progressing   Problem: Nutrition: Goal: Adequate nutrition will be maintained Outcome: Progressing   Problem: Coping: Goal: Level of anxiety will decrease Outcome: Progressing   

## 2024-03-13 DIAGNOSIS — D62 Acute posthemorrhagic anemia: Secondary | ICD-10-CM | POA: Diagnosis not present

## 2024-03-13 DIAGNOSIS — S45002A Unspecified injury of axillary artery, left side, initial encounter: Secondary | ICD-10-CM | POA: Diagnosis not present

## 2024-03-13 LAB — BASIC METABOLIC PANEL WITH GFR
Anion gap: 10 (ref 5–15)
BUN: 12 mg/dL (ref 6–20)
CO2: 22 mmol/L (ref 22–32)
Calcium: 8.8 mg/dL — ABNORMAL LOW (ref 8.9–10.3)
Chloride: 101 mmol/L (ref 98–111)
Creatinine, Ser: 0.62 mg/dL (ref 0.61–1.24)
GFR, Estimated: >60 mL/min (ref >60)
Glucose, Bld: 146 mg/dL — ABNORMAL HIGH (ref 70–99)
Potassium: 4 mmol/L (ref 3.5–5.1)
Sodium: 133 mmol/L — ABNORMAL LOW (ref 135–145)

## 2024-03-13 LAB — CBC
HCT: 35.1 % — ABNORMAL LOW (ref 39.0–52.0)
HCT: 35.5 % — ABNORMAL LOW (ref 39.0–52.0)
Hemoglobin: 11.9 g/dL — ABNORMAL LOW (ref 13.0–17.0)
Hemoglobin: 11.9 g/dL — ABNORMAL LOW (ref 13.0–17.0)
MCH: 30.8 pg (ref 26.0–34.0)
MCH: 30.6 pg (ref 26.0–34.0)
MCHC: 33.9 g/dL (ref 30.0–36.0)
MCHC: 33.5 g/dL (ref 30.0–36.0)
MCV: 90.9 fL (ref 80.0–100.0)
MCV: 91.3 fL (ref 80.0–100.0)
Platelets: 167 K/uL (ref 150–400)
Platelets: 171 K/uL (ref 150–400)
RBC: 3.86 MIL/uL — ABNORMAL LOW (ref 4.22–5.81)
RBC: 3.89 MIL/uL — ABNORMAL LOW (ref 4.22–5.81)
RDW: 13.6 % (ref 11.5–15.5)
RDW: 13.6 % (ref 11.5–15.5)
WBC: 16.7 K/uL — ABNORMAL HIGH (ref 4.0–10.5)
WBC: 16.6 K/uL — ABNORMAL HIGH (ref 4.0–10.5)
nRBC: 0 % (ref 0.0–0.2)
nRBC: 0 % (ref 0.0–0.2)

## 2024-03-13 LAB — GLUCOSE, CAPILLARY
Glucose-Capillary: 129 mg/dL — ABNORMAL HIGH (ref 70–99)
Glucose-Capillary: 180 mg/dL — ABNORMAL HIGH (ref 70–99)
Glucose-Capillary: 140 mg/dL — ABNORMAL HIGH (ref 70–99)
Glucose-Capillary: 189 mg/dL — ABNORMAL HIGH (ref 70–99)

## 2024-03-13 NOTE — Plan of Care (Signed)
  Problem: Activity: Goal: Risk for activity intolerance will decrease Outcome: Progressing   Problem: Safety: Goal: Ability to remain free from injury will improve Outcome: Progressing   

## 2024-03-13 NOTE — Progress Notes (Addendum)
 Progress Note    Randy Hebert  FMW:[id number] DOB: Nov 12, 1965  DOA: 03/11/2024 PCP: Cook, Jayce G, DO      Brief Narrative:    Medical records reviewed and are as summarized below:  Randy Hebert is a 58 y.o. male with history of diabetes mellitus type 2, hypertension, CAD, liver cirrhosis, anxiety, depression, OSA on CPAP, recent admission for stroke with left-sided weakness, discharged on 03/03/2024, who was brought to the ER after patient had a fall after tripping on a screw and landed on his left shoulder.  He complained of severe left shoulder pain.  He was found to have left shoulder dislocation which was reduced in the emergency department.  He was also found to have left axillary hematoma with active extravasation on CT scan of left upper extremity.       Assessment/Plan:   Principal Problem:   Axillary artery injury Active Problems:   Essential hypertension, benign   CAD (coronary artery disease)   Mixed hyperlipidemia   OSA (obstructive sleep apnea)   DM type 2 causing vascular disease (HCC)   Alcohol  abuse   Cirrhosis of liver (HCC)   Tobacco abuse   Axillary artery injury, left, initial encounter   Acute blood loss anemia    Body mass index is 32.12 kg/m.  (Class I obesity)   Left axillary hematoma with active extravasation noted on CT upper extremity: S/p repair of left axillary artery pseudoaneurysm on 03/12/2024.  JP drain to axillary wound with high output. Follow-up with vascular surgeon for further recommendations.  Acute blood loss anemia: Hemoglobin down from 18-12.7 -11.9-11.9.  No indication for blood transfusion at this time.  Continue to monitor. Persistent leukocytosis: WBC down from 24.5 to 16.2.  Monitor CBC.   S/p left shoulder dislocation, s/p mechanical fall at home: Dislocated shoulder was reduced in the ED on 03/11/2024   Recent stroke with left-sided weakness (acute right thalamic infarct on 03/02/2024): Continue aspirin ,  Plavix  and Lipitor    Type II DM with hyperglycemia: NovoLog  as needed for hyperglycemia.   Hyponatremia: Sodium level slowly trending down (136-133).  Asymptomatic.  Monitor BMP.   Liver cirrhosis: Compensated   Comorbidities include hypertension, CAD, anxiety, depression, OSA on CPAP at night, history of chronic combined systolic and diastolic CHF but now with recovered EF (recent 2D echo on 03/03/2024 showed midrange EF 45 to 50%).   Diet Order             DIET SOFT Room service appropriate? Yes; Fluid consistency: Thin  Diet effective now                            Consultants: Vascular surgeon  Procedures: S/p repair of left axillary artery pseudoaneurysm on 03/12/2024.      Medications:    amLODipine   10 mg Oral Daily   aspirin  EC  81 mg Oral Daily   atorvastatin   40 mg Oral Daily   citalopram   20 mg Oral Daily   clopidogrel   75 mg Oral Daily   ezetimibe   10 mg Oral Daily   heparin   5,000 Units Subcutaneous Q8H   insulin  aspart  0-9 Units Subcutaneous TID WC   Continuous Infusions:   Anti-infectives (From admission, onward)    Start     Dose/Rate Route Frequency Ordered Stop   03/12/24 1115  ceFAZolin  (ANCEF ) IVPB 2g/100 mL premix        2 g 200 mL/hr over  30 Minutes Intravenous Every 8 hours 03/12/24 1019 03/12/24 1855              Family Communication/Anticipated D/C date and plan/Code Status   DVT prophylaxis: heparin  injection 5,000 Units Start: 03/12/24 1400 SCD's Start: 03/12/24 1020 SCDs Start: 03/12/24 0355     Code Status: Full Code  Family Communication: None Disposition Plan: Plan to discharge home   Status is: Inpatient Remains inpatient appropriate because: S/p repair of left axillary artery pseudoaneurysm on 03/12/2024.         Subjective:   Interval events noted.  He still has some numbness and weakness in the left upper extremity though he thinks is slightly better.  Pain in the left axillary area is  not as bad as it was before  Objective:    Vitals:   03/12/24 1333 03/12/24 1937 03/13/24 0420 03/13/24 0723  BP: (!) 146/79 125/69 116/76 132/78  Pulse: 86 69 75 77  Resp: 16 17 17 17   Temp: 97.6 F (36.4 C) (!) 97.5 F (36.4 C) 98.1 F (36.7 C) 98.1 F (36.7 C)  TempSrc: Oral Oral Oral Oral  SpO2: 96% 90% 97% 91%  Weight:      Height:       No data found.   Intake/Output Summary (Last 24 hours) at 03/13/2024 1158 Last data filed at 03/13/2024 0600 Gross per 24 hour  Intake 835.17 ml  Output 200 ml  Net 635.17 ml   Filed Weights   03/11/24 1455  Weight: 87.5 kg    Exam:  GEN: NAD SKIN: No rash. Ecchymosis left axilla, left upper chest and left upper back EYES: No pallor or icterus ENT: MMM CV: RRR PULM: CTA B ABD: soft, obese, NT, +BS CNS: AAO x 3, left upper extremity weakness (power 3/5) EXT: No edema or tenderness       Data Reviewed:   I have personally reviewed following labs and imaging studies:  Labs: Labs show the following:   Basic Metabolic Panel: Recent Labs  Lab 03/11/24 1613 03/12/24 0643 03/13/24 0828  NA 136 134* 133*  K 3.6 4.3 4.0  CL 99 100 101  CO2 20* 25 22  GLUCOSE 223* 192* 146*  BUN 12 16 12   CREATININE 0.85 0.82 0.62  CALCIUM  9.3 8.8* 8.8*   GFR Estimated Creatinine Clearance: 101.1 mL/min (by C-G formula based on SCr of 0.62 mg/dL). Liver Function Tests: Recent Labs  Lab 03/11/24 1613  AST 36  ALT 46*  ALKPHOS 101  BILITOT 1.4*  PROT 7.8  ALBUMIN  4.0   No results for input(s): LIPASE, AMYLASE in the last 168 hours. No results for input(s): AMMONIA in the last 168 hours. Coagulation profile No results for input(s): INR, PROTIME in the last 168 hours.  CBC: Recent Labs  Lab 03/11/24 1613 03/12/24 0643 03/13/24 0828 03/13/24 0832  WBC 24.5* 16.2* 16.7* 16.6*  NEUTROABS 21.0* 14.5*  --   --   HGB 18.0* 12.7* 11.9* 11.9*  HCT 51.5 37.7* 35.1* 35.5*  MCV 91.2 91.3 90.9 91.3  PLT 274 165  167 171   Cardiac Enzymes: No results for input(s): CKTOTAL, CKMB, CKMBINDEX, TROPONINI in the last 168 hours. BNP (last 3 results) No results for input(s): PROBNP in the last 8760 hours. CBG: Recent Labs  Lab 03/12/24 1113 03/12/24 1619 03/12/24 2131 03/13/24 0635 03/13/24 1146  GLUCAP 189* 130* 181* 129* 180*   D-Dimer: No results for input(s): DDIMER in the last 72 hours. Hgb A1c: No results for input(s):  HGBA1C in the last 72 hours. Lipid Profile: No results for input(s): CHOL, HDL, LDLCALC, TRIG, CHOLHDL, LDLDIRECT in the last 72 hours. Thyroid  function studies: No results for input(s): TSH, T4TOTAL, T3FREE, THYROIDAB in the last 72 hours.  Invalid input(s): FREET3 Anemia work up: No results for input(s): VITAMINB12, FOLATE, FERRITIN, TIBC, IRON, RETICCTPCT in the last 72 hours. Sepsis Labs: Recent Labs  Lab 03/11/24 1613 03/12/24 0643 03/13/24 0828 03/13/24 0832  WBC 24.5* 16.2* 16.7* 16.6*    Microbiology No results found for this or any previous visit (from the past 240 hours).  Procedures and diagnostic studies:  CT ANGIO UP EXTREM LEFT W &/OR WO CONTAST Result Date: 03/11/2024 CLINICAL DATA:  Recent shoulder dislocation with surrounding muscular hematoma, initial encounter EXAM: CT ANGIOGRAPHY OF THE LEFT UPPEREXTREMITY TECHNIQUE: Multidetector CT imaging of the left upper extremitywas performed using the standard protocol during bolus administration of intravenous contrast. Multiplanar CT image reconstructions and MIPs were obtained to evaluate the vascular anatomy. RADIATION DOSE REDUCTION: This exam was performed according to the departmental dose-optimization program which includes automated exposure control, adjustment of the mA and/or kV according to patient size and/or use of iterative reconstruction technique. CONTRAST:  75mL OMNIPAQUE  IOHEXOL  350 MG/ML SOLN COMPARISON:  CT of the shoulder from earlier in  the same day. FINDINGS: Vascular: Right aortic arch is noted. The subclavian artery is patent throughout its course. The axillary artery is somewhat attenuated secondary to extrinsic compression by the muscular hematoma seen previously. The more distal brachial radial and ulnar arteries are patent. The previously described intramuscular hematoma is again seen within the subscapularis. The soft tissue hematoma in the left axilla is again identified as well with central extravasation identified. Given the lack of venous opacification on the left, this likely represents a small arterial hemorrhage. The overall hematoma measures approximately 6.7 x 5.3 cm in greatest transverse and AP dimensions not significantly changed from the prior exam. Attenuation of the lateral thoracic artery is noted as well related to extrinsic compression by the hematoma. Some mild subcutaneous extension of hemorrhage along the lateral chest wall is again seen similar to that noted on the prior exam. Nonvascular: Significant degenerative changes of the glenohumeral and acromioclavicular joints are seen. No definitive rib fracture is identified. Visualized structures in the chest and abdomen on the margin of the scan appear within normal limits. Review of the MIP images confirms the above findings. IMPRESSION: Attenuation of the left axillary artery secondary to extrinsic compression. No definitive dissection is identified. Active hemorrhage is noted within the left axillary hematoma likely related to a small branch of the lateral thoracic artery beneath the axillary hematoma. This is best visualized on the coronal imaging (image number 62 of series 10). Critical Value/emergent results were called by telephone at the time of interpretation on 03/11/2024 at 9:48 pm to Dr. SID BONING , who verbally acknowledged these results. Electronically Signed   By: Oneil Devonshire M.D.   On: 03/11/2024 21:51   CT Shoulder Left Wo Contrast Result Date:  03/11/2024 CLINICAL DATA:  Shoulder trauma, fracture of humerus or scapula. Fall, left shoulder injury EXAM: CT OF THE UPPER LEFT EXTREMITY WITHOUT CONTRAST TECHNIQUE: Multidetector CT imaging of the upper left extremity was performed according to the standard protocol. RADIATION DOSE REDUCTION: This exam was performed according to the departmental dose-optimization program which includes automated exposure control, adjustment of the mA and/or kV according to patient size and/or use of iterative reconstruction technique. COMPARISON:  None Available. FINDINGS: Bones/Joint/Cartilage Normal  alignment. No acute fracture or dislocation. Mild acromioclavicular and moderate glenohumeral degenerative arthritis. Degenerative changes are seen within the cervical spine, not optimally profiled on this examination, but with advanced degenerative disc disease at C4-T1 with severe left neuroforaminal narrowing at these levels. Ligaments Suboptimally assessed by CT. Muscles and Tendons There is marked fatty atrophy of the supraspinatus and infraspinatus musculature. Moderate fatty atrophy of the subscapularis musculature. There is hyperdensity within the residual muscle fibers of the subscapularis with associated thickening, best appreciated on 55/8 and 59/10 in keeping with an intramuscular hematoma. There is a lobulated hyperdense collection noted within the interstitial soft tissues of the left axilla measuring roughly 5.4 x 6.0 x 9.5 cm (84/10, 56/8) in keeping with a acute hematoma. There is interstitial hemorrhage noted surrounding this within the left axilla seen tracking into the subclavicular region and inferiorly along the left chest wall. Soft tissues See above.  Left shoulder effusion is present. IMPRESSION: 1. No acute fracture or dislocation. 2. Moderate glenohumeral degenerative arthritis. 3. Marked fatty atrophy of the supraspinatus and infraspinatus musculature. Moderate fatty atrophy of the subscapularis  musculature. 4. Intramuscular hematoma within the residual muscle fibers of the subscapularis. 5. Acute hematoma within the left axilla measuring roughly 5.4 x 6.0 x 9.5 cm. Surrounding interstitial hemorrhage with left axilla and chest wall. 6. Extensive degenerative disc disease at C4-T1 with severe left neuroforaminal narrowing at these levels. Electronically Signed   By: Dorethia Molt M.D.   On: 03/11/2024 19:17   DG Chest Portable 1 View Result Date: 03/11/2024 CLINICAL DATA:  Shortness of breath EXAM: PORTABLE CHEST 1 VIEW COMPARISON:  Chest radiograph dated 03/24/2023 FINDINGS: Implanted loop recorder projects over the medial left lung base. Metallic clip projects over the right apex. Normal lung volumes. Left basilar patchy opacities. No pleural effusion or pneumothorax. Right aortic arch. Similar enlarged cardiomediastinal silhouette. No acute osseous abnormality. IMPRESSION: 1. Left basilar patchy opacities, which may represent atelectasis, aspiration, or pneumonia. 2. Similar enlarged cardiomediastinal silhouette. 3. Right aortic arch. Electronically Signed   By: Limin  Xu M.D.   On: 03/11/2024 18:57   DG Shoulder Left Result Date: 03/11/2024 CLINICAL DATA:  Tripped over screw in apartment and landed on left shoulder. EXAM: LEFT SHOULDER - 2+ VIEW COMPARISON:  Same-day radiograph at 2:57 p.m. FINDINGS: Successful reduction of the left shoulder dislocation. Curvilinear lucency along the inferior glenoid suspicious for nondisplaced fracture. Degenerative changes AC and glenohumeral joints. IMPRESSION: 1. Successful reduction of the left glenohumeral dislocation. 2. Curvilinear lucency along the inferior glenoid suspicious for nondisplaced fracture. CT is recommended for further evaluation. Electronically Signed   By: Norman Gatlin M.D.   On: 03/11/2024 17:38   DG Shoulder Left Result Date: 03/11/2024 CLINICAL DATA:  Left shoulder pain following a fall. EXAM: LEFT SHOULDER - 2+ VIEW COMPARISON:   None Available. FINDINGS: Anterior, inferior dislocation of the humeral head relative to the acromion. Moderate humeral head and neck junction spur formation. No fracture seen. IMPRESSION: Anterior, inferior dislocation of the humeral head. Electronically Signed   By: Elspeth Bathe M.D.   On: 03/11/2024 15:28               LOS: 1 day   Henri Baumler  [facility]   Pager on [email or web address]. If 7PM-7AM, please contact night-coverage at [email or web address]     03/13/2024, 11:58 AM

## 2024-03-13 NOTE — Plan of Care (Signed)
   Problem: Education: Goal: Knowledge of General Education information will improve Description Including pain rating scale, medication(s)/side effects and non-pharmacologic comfort measures Outcome: Progressing   Problem: Clinical Measurements: Goal: Will remain free from infection Outcome: Progressing Goal: Diagnostic test results will improve Outcome: Progressing Goal: Cardiovascular complication will be avoided Outcome: Progressing   Problem: Activity: Goal: Risk for activity intolerance will decrease Outcome: Progressing

## 2024-03-13 NOTE — Progress Notes (Addendum)
  Progress Note    03/13/2024 8:12 AM 2 Days Post-Op  Subjective:  no events overnight   Vitals:   03/13/24 0420 03/13/24 0723  BP: 116/76 132/78  Pulse: 75 77  Resp: 17 17  Temp: 98.1 F (36.7 C) 98.1 F (36.7 C)  SpO2: 97% 91%   Physical Exam: Lungs:  non labored Incisions:  L shoulder and arm incisions c/d/I; ecchymosis but no firm hematoma Extremities:  palpable L radial Neurologic: minimal grip strength; persistent L arm weakness  CBC    Component Value Date/Time   WBC 16.2 (H) 03/12/2024 0643   RBC 4.13 (L) 03/12/2024 0643   HGB 12.7 (L) 03/12/2024 0643   HGB 17.1 10/28/2023 0809   HCT 37.7 (L) 03/12/2024 0643   HCT 50.7 10/28/2023 0809   PLT 165 03/12/2024 0643   PLT 175 10/28/2023 0809   MCV 91.3 03/12/2024 0643   MCV 91 10/28/2023 0809   MCH 30.8 03/12/2024 0643   MCHC 33.7 03/12/2024 0643   RDW 13.3 03/12/2024 0643   RDW 12.8 10/28/2023 0809   LYMPHSABS 0.9 03/12/2024 0643   LYMPHSABS 1.6 10/28/2023 0809   MONOABS 0.6 03/12/2024 0643   EOSABS 0.0 03/12/2024 0643   EOSABS 0.2 10/28/2023 0809   BASOSABS 0.0 03/12/2024 0643   BASOSABS 0.0 10/28/2023 0809    BMET    Component Value Date/Time   NA 134 (L) 03/12/2024 0643   NA 138 10/28/2023 0809   K 4.3 03/12/2024 0643   CL 100 03/12/2024 0643   CO2 25 03/12/2024 0643   GLUCOSE 192 (H) 03/12/2024 0643   BUN 16 03/12/2024 0643   BUN 15 10/28/2023 0809   CREATININE 0.82 03/12/2024 0643   CREATININE 0.68 (L) 09/29/2022 0836   CALCIUM  8.8 (L) 03/12/2024 0643   GFRNONAA >60 03/12/2024 0643   GFRNONAA 108 06/21/2020 0707   GFRAA 127 10/24/2020 0811   GFRAA 125 06/21/2020 0707    INR    Component Value Date/Time   INR 1.0 03/02/2024 1920     Intake/Output Summary (Last 24 hours) at 03/13/2024 9187 Last data filed at 03/13/2024 0600 Gross per 24 hour  Intake 835.17 ml  Output 280 ml  Net 555.17 ml     Assessment/Plan:  58 y.o. male is s/p repair of L axillary pseudoaneurysm 2 Days  Post-Op   L hand well perfused with palpable radial pulse Continue JP due to high output Subjectively, grip strength is improving; recommend OT evaluation Continue aspirin  and plavix   Donnice Sender, PA-C [facility] [phone] 03/13/2024 8:12 AM   I agree with the above.  Continue to monitor JP output  Wells Curtez Brallier

## 2024-03-14 DIAGNOSIS — S45002A Unspecified injury of axillary artery, left side, initial encounter: Secondary | ICD-10-CM | POA: Diagnosis not present

## 2024-03-14 LAB — CBC
HCT: 33.3 % — ABNORMAL LOW (ref 39.0–52.0)
Hemoglobin: 11.2 g/dL — ABNORMAL LOW (ref 13.0–17.0)
MCH: 30.9 pg (ref 26.0–34.0)
MCHC: 33.6 g/dL (ref 30.0–36.0)
MCV: 91.7 fL (ref 80.0–100.0)
Platelets: 151 K/uL (ref 150–400)
RBC: 3.63 MIL/uL — ABNORMAL LOW (ref 4.22–5.81)
RDW: 13.4 % (ref 11.5–15.5)
WBC: 12.2 K/uL — ABNORMAL HIGH (ref 4.0–10.5)
nRBC: 0 % (ref 0.0–0.2)

## 2024-03-14 LAB — HEMOGLOBIN AND HEMATOCRIT, BLOOD
HCT: 32.7 % — ABNORMAL LOW (ref 39.0–52.0)
Hemoglobin: 11 g/dL — ABNORMAL LOW (ref 13.0–17.0)

## 2024-03-14 LAB — GLUCOSE, CAPILLARY
Glucose-Capillary: 206 mg/dL — ABNORMAL HIGH (ref 70–99)
Glucose-Capillary: 83 mg/dL (ref 70–99)
Glucose-Capillary: 175 mg/dL — ABNORMAL HIGH (ref 70–99)
Glucose-Capillary: 168 mg/dL — ABNORMAL HIGH (ref 70–99)
Glucose-Capillary: 193 mg/dL — ABNORMAL HIGH (ref 70–99)

## 2024-03-14 LAB — BASIC METABOLIC PANEL WITH GFR
Anion gap: 9 (ref 5–15)
BUN: 9 mg/dL (ref 6–20)
CO2: 24 mmol/L (ref 22–32)
Calcium: 8.6 mg/dL — ABNORMAL LOW (ref 8.9–10.3)
Chloride: 101 mmol/L (ref 98–111)
Creatinine, Ser: 0.6 mg/dL — ABNORMAL LOW (ref 0.61–1.24)
GFR, Estimated: >60 mL/min (ref >60)
Glucose, Bld: 152 mg/dL — ABNORMAL HIGH (ref 70–99)
Potassium: 3.8 mmol/L (ref 3.5–5.1)
Sodium: 134 mmol/L — ABNORMAL LOW (ref 135–145)

## 2024-03-14 MED ORDER — THIAMINE MONONITRATE 100 MG PO TABS
100.0000 mg | ORAL_TABLET | Freq: Every day | ORAL | Status: DC
  Administered 2024-03-14 – 2024-03-19 (×6): 100 mg via ORAL
  Filled 2024-03-14 (×6): qty 1

## 2024-03-14 MED ORDER — ADULT MULTIVITAMIN W/MINERALS CH
1.0000 | ORAL_TABLET | Freq: Every day | ORAL | Status: DC
  Administered 2024-03-14 – 2024-03-19 (×6): 1 via ORAL
  Filled 2024-03-14 (×6): qty 1

## 2024-03-14 MED ORDER — GUAIFENESIN-DM 100-10 MG/5ML PO SYRP
5.0000 mL | ORAL_SOLUTION | ORAL | Status: DC | PRN
  Administered 2024-03-14 (×2): 5 mL via ORAL
  Filled 2024-03-14 (×2): qty 10

## 2024-03-14 MED ORDER — HYDROMORPHONE HCL 1 MG/ML IJ SOLN: 0.5000 mg | INTRAMUSCULAR | Status: DC | PRN

## 2024-03-14 MED ORDER — LORAZEPAM 2 MG/ML IJ SOLN
1.0000 mg | INTRAMUSCULAR | Status: AC | PRN
  Administered 2024-03-14: 2 mg via INTRAVENOUS
  Filled 2024-03-14: qty 1

## 2024-03-14 MED ORDER — FOLIC ACID 1 MG PO TABS
1.0000 mg | ORAL_TABLET | Freq: Every day | ORAL | Status: DC
  Administered 2024-03-14 – 2024-03-19 (×6): 1 mg via ORAL
  Filled 2024-03-14 (×6): qty 1

## 2024-03-14 MED ORDER — HYDROMORPHONE HCL 1 MG/ML IJ SOLN
1.0000 mg | INTRAMUSCULAR | Status: DC | PRN
  Administered 2024-03-14 – 2024-03-17 (×10): 1 mg via INTRAVENOUS
  Filled 2024-03-14 (×10): qty 1

## 2024-03-14 MED ORDER — THIAMINE HCL 100 MG/ML IJ SOLN
100.0000 mg | Freq: Every day | INTRAMUSCULAR | Status: DC
  Filled 2024-03-14: qty 2

## 2024-03-14 MED ORDER — LORAZEPAM 1 MG PO TABS
1.0000 mg | ORAL_TABLET | ORAL | Status: AC | PRN
  Filled 2024-03-14: qty 2

## 2024-03-14 NOTE — Progress Notes (Signed)
 Transition of Care [facility]) - CAGE-AID Screening   Patient Details  Name: Randy Hebert MRN: [id number] Date of Birth: 1966-04-06   MARINDA LIONEL Sora, RN Phone Number: 03/14/2024, 5:08 AM   Clinical Narrative:  Pt reports he has again stopped drinking alcohol  14 days ago, does not report any residual withdrawal s/s. Pt reports he is a 1PPD smoker 40+ years and was attempting to use nicotine  patches, would like to continue patches in attempt to quit smoking, he would benefit from smoking cessation information upon discharge.  Pt denies elicit substances-clean 33yrs   CAGE-AID Screening:    Have You Ever Felt You Ought to Cut Down on Your Drinking or Drug Use?: Yes Have People Annoyed You By Critizing Your Drinking Or Drug Use?: No Have You Felt Bad Or Guilty About Your Drinking Or Drug Use?: Yes Have You Ever Had a Drink or Used Drugs First Thing In The Morning to Steady Your Nerves or to Get Rid of a Hangover?: No CAGE-AID Score: 2  Substance Abuse Education Offered: Yes

## 2024-03-14 NOTE — Progress Notes (Signed)
 PROGRESS NOTE    Randy Hebert  FMW:[id number]  DOB: Apr 02, 1966  DOA: 03/11/2024 PCP: Cook, Jayce G, DO Outpatient Specialists:   Hospital course:  57 y.o. male with history of diabetes mellitus type 2, hypertension, CAD, liver cirrhosis, anxiety, depression, OSA on CPAP, recent admission for stroke with left-sided weakness, discharged on 03/03/2024, who was brought to the ER after patient had a fall after tripping on a screw and landed on his left shoulder.  He was found to have left shoulder dislocation which was reduced in the emergency department.  He was also found to have left axillary hematoma with active extravasation on CT scan of left upper extremity.  Patient underwent repair of left axillary pseudoaneurysm on 7/12 by vascular surgery.   Subjective:  Patient's main concern is ongoing pain.  Notes that he cannot keep it in a sling.  I notice he is on high dose morphine  every 2 hours.  Patient states he needs to be on a pump.  Objective: Vitals:   03/13/24 2056 03/14/24 0533 03/14/24 0840 03/14/24 1500  BP: 137/73 125/75 (!) 142/81 (!) 111/95  Pulse: 91 80 85 78  Resp: 18 18 17 16   Temp: 98.6 F (37 C) 98.4 F (36.9 C) 98 F (36.7 C) 97.9 F (36.6 C)  TempSrc: Oral Oral Oral Oral  SpO2: 94% 92% 94% 94%  Weight:      Height:        Intake/Output Summary (Last 24 hours) at 03/14/2024 1536 Last data filed at 03/14/2024 1500 Gross per 24 hour  Intake 240 ml  Output 530 ml  Net -290 ml   Filed Weights   03/11/24 1455  Weight: 87.5 kg     Exam:  General: Patient sitting up in chair resting his arm on table.  Eyes: sclera anicteric, conjuctiva mild injection bilaterally CVS: S1-S2, regular  Respiratory:  decreased air entry bilaterally secondary to decreased inspiratory effort, rales at bases  GI: NABS, soft, NT  LE: He does have swelling of LUE with CDI and JP drain in place.  Radial pulses palpable. Neuro: A/O x 3,  grossly nonfocal Psych: Patient is  irritable and sarcastic, poor insight.   Data Reviewed:  Basic Metabolic Panel: Recent Labs  Lab 03/11/24 1613 03/12/24 0643 03/13/24 0828 03/14/24 0632  NA 136 134* 133* 134*  K 3.6 4.3 4.0 3.8  CL 99 100 101 101  CO2 20* 25 22 24   GLUCOSE 223* 192* 146* 152*  BUN 12 16 12 9   CREATININE 0.85 0.82 0.62 0.60*  CALCIUM  9.3 8.8* 8.8* 8.6*    CBC: Recent Labs  Lab 03/11/24 1613 03/12/24 0643 03/13/24 0828 03/13/24 0832 03/14/24 0632  WBC 24.5* 16.2* 16.7* 16.6* 12.2*  NEUTROABS 21.0* 14.5*  --   --   --   HGB 18.0* 12.7* 11.9* 11.9* 11.2*  HCT 51.5 37.7* 35.1* 35.5* 33.3*  MCV 91.2 91.3 90.9 91.3 91.7  PLT 274 165 167 171 151     Scheduled Meds:  amLODipine   10 mg Oral Daily   aspirin  EC  81 mg Oral Daily   atorvastatin   40 mg Oral Daily   citalopram   20 mg Oral Daily   clopidogrel   75 mg Oral Daily   ezetimibe   10 mg Oral Daily   folic acid   1 mg Oral Daily   heparin   5,000 Units Subcutaneous Q8H   insulin  aspart  0-9 Units Subcutaneous TID WC   multivitamin with minerals  1 tablet Oral Daily  thiamine   100 mg Oral Daily   Or   thiamine   100 mg Intravenous Daily   Continuous Infusions:   Assessment & Plan:  Increased pain Irritability Alcohol  use Per RN discussion, patient was doing well until afternoon when he started having increased pain and was increasingly irritable and restless.  She treated him with Percocet and then with IV morphine  but patient continued to be restless. Will place patient on CIWA protocol Change morphine  to trial of Dilaudid  every 2 as needed  Left axillary hematoma S/p left axillary pseudoaneurysm repair on 7/12 JP drain in place Closely followed by vascular surgery  ABLA Leukocytosis likely reactive Hemoglobin decreased on arrival from 18-13,, stabilized around 12 Today it is 11.2, down from 11.9 yesterday. Will repeat hemoglobin tonight to ensure stability Leukocytosis, likely reactive continues to trend  down  Left shoulder dislocation Reduced in the ED  Recent right thalamic CVA on 7/2 CAD Continue aspirin , Plavix  and Lipitor   DM 2 Continue present management  Liver cirrhosis Combined HFpEF/HFrEF, EF last week 45 to 50% Compensated   DVT prophylaxis: Subcu heparin  Code Status: Full Family Communication: Daughter was at bedside     Studies: No results found.  Principal Problem:   Axillary artery injury Active Problems:   Essential hypertension, benign   CAD (coronary artery disease)   Mixed hyperlipidemia   OSA (obstructive sleep apnea)   DM type 2 causing vascular disease (HCC)   Alcohol  abuse   Cirrhosis of liver (HCC)   Tobacco abuse   Axillary artery injury, left, initial encounter   Acute blood loss anemia     Randy Hebert Randy Hebert, [facility]  If 7PM-7AM, please contact night-coverage [email or web address]   LOS: 2 days

## 2024-03-14 NOTE — Progress Notes (Signed)
 Mobility Specialist Progress Note:   03/14/24 1045  Mobility  Activity Ambulated with assistance in hallway  Level of Assistance Modified independent, requires aide device or extra time  Assistive Device None  Distance Ambulated (ft) 200 ft  LUE Weight Bearing Per Provider Order NWB  Activity Response Tolerated well  Mobility Referral Yes  Mobility visit 1 Mobility  Mobility Specialist Start Time (ACUTE ONLY) 1025  Mobility Specialist Stop Time (ACUTE ONLY) 1035  Mobility Specialist Time Calculation (min) (ACUTE ONLY) 10 min   Received pt in chair and agreeable to mobility. Required no physical assistance. No c/o. Returned pt to room without fault. Left pt in chair with personal belongings and call light within reach. All needs met.  Lavanda Pollack Mobility Specialist  Please contact via [employer] or  Rehab Office [phone]

## 2024-03-14 NOTE — Progress Notes (Signed)
 Patient is non-compliant with medical recommendations. He refused to sleep in the hospital bed and declined use of the arm sling as advised by the physician. The patient is identified as a high fall risk and is non-compliant with fall prevention protocols, including refusal of chair alarm activation and failure to request assistance when using the bathroom. The RN educated the patient on the risks associated with noncompliance to medical and safety measures. Patient verbalized understanding but appeared irritated.

## 2024-03-14 NOTE — Evaluation (Signed)
 Occupational Therapy Evaluation Patient Details Name: Randy Hebert MRN: [id number] DOB: 29-Jun-1966 Today's Date: 03/14/2024   History of Present Illness   Pt is a 58 y/o male presenting after a fall resulting in L shoulder dislocation. L shoulder reduced in ED, found to have L axillary hematoma. Underwent repair of left axillary pseudoaneurysm and evacuation of L axillary hematoma in 7/12. PMH: Recent CVA (discharged 7/30), DM2, CAD, CHF, COPD, GERD, NSTEMI, MI     Clinical Impressions PTA, pt lives alone in elevator-access apartment, typically Independent with ADLs, IADLs and mobility with occasional use of cane. Pt presents now near this reported baseline despite L nondominant UE injury. Educated re: sling wear (though pt adamantly declines), positioning of LUE, AROM of hand/wrist/elbow and compensatory strategies for ADLs. Pt with edema throughout this UE- encouraged elevation, provided squeeze sponge and provided ice pack. Pt declines concerns managing at home, has family nearby to assist if needed. Recommend consideration of OP OT once pt able to progress LUE ROM/strengthening.      If plan is discharge home, recommend the following:   Assistance with cooking/housework;Assist for transportation     Functional Status Assessment   Patient has had a recent decline in their functional status and demonstrates the ability to make significant improvements in function in a reasonable and predictable amount of time.     Equipment Recommendations   None recommended by OT     Recommendations for Other Services         Precautions/Restrictions   Precautions Precautions: Fall Precaution/Restrictions Comments: L shoulder JP drain Required Braces or Orthoses: Sling Restrictions Weight Bearing Restrictions Per Provider Order: Yes LUE Weight Bearing Per Provider Order: Non weight bearing     Mobility Bed Mobility               General bed mobility comments: in  recliner on entry    Transfers Overall transfer level: Independent Equipment used: None                      Balance Overall balance assessment: No apparent balance deficits (not formally assessed)                                         ADL either performed or assessed with clinical judgement   ADL Overall ADL's : Modified independent                                       General ADL Comments: pt reports going to bathroom and managing these tasks without issues as pt is R hand dominant. Educated on ADL modifications w/ shoulder DC sheet provided, AROM of digits, wrist and elbow and provide squeeze sponge for grip strength. Educated on sling recommendations and OT offered to provide adjustments in attempt to find comfort though pt reported understanding though still declined. Educated on supporting UE with mobility, elevating on pillows and positioning while sleeping. Provided ice pack and encouraged use of ice at home     Vision Baseline Vision/History: 1 Wears glasses;4 Cataracts Ability to See in Adequate Light: 1 Impaired Patient Visual Report: No change from baseline Vision Assessment?: No apparent visual deficits     Perception         Praxis         Pertinent  Vitals/Pain Pain Assessment Pain Assessment: Faces Faces Pain Scale: Hurts little more Pain Location: L shoulder Pain Descriptors / Indicators: Grimacing, Guarding Pain Intervention(s): Monitored during session, Limited activity within patient's tolerance     Extremity/Trunk Assessment Upper Extremity Assessment Upper Extremity Assessment: Right hand dominant;LUE deficits/detail LUE Deficits / Details: bruising around L shoulder, swelling throughout. able to wiggle fingers, move wrist. elbow flexion limited by pain and swelling. reports improvements in movement since admission and denies abnormal sensation. hx of CVA affecting this side LUE Coordination: decreased  fine motor;decreased gross motor   Lower Extremity Assessment Lower Extremity Assessment: Overall WFL for tasks assessed   Cervical / Trunk Assessment Cervical / Trunk Assessment: Normal   Communication Communication Communication: No apparent difficulties   Cognition Arousal: Alert Behavior During Therapy: WFL for tasks assessed/performed Cognition: No apparent impairments             OT - Cognition Comments: aware of MD recs for sling wear though declined to wear sling; reports understanding of reasoning and risk                 Following commands: Intact       Cueing  General Comments   Cueing Techniques: Verbal cues      Exercises     Shoulder Instructions      Home Living Family/patient expects to be discharged to:: Private residence Living Arrangements: Alone Available Help at Discharge: Family;Available PRN/intermittently;Neighbor Type of Home: Apartment Home Access: Elevator     Home Layout: One level     Bathroom Shower/Tub: Chief Strategy Officer: Standard Bathroom Accessibility: Yes How Accessible: Accessible via wheelchair;Accessible via [place] Home Equipment: Cane - single point          Prior Functioning/Environment Prior Level of Function : Independent/Modified Independent;Driving             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 ADLs Comments: Independent; drives    OT Problem List: Decreased strength;Decreased range of motion;Decreased coordination;Impaired UE functional use;Pain;Increased edema   OT Treatment/Interventions: Self-care/ADL training;Therapeutic exercise;Energy conservation;DME and/or AE instruction;Therapeutic activities;Manual therapy;Patient/family education      OT Goals(Current goals can be found in the care plan section)   Acute Rehab OT Goals Patient Stated Goal: go home tomorrow, pain control OT Goal Formulation: With patient Time For Goal Achievement:  03/28/24 Potential to Achieve Goals: Good ADL Goals Pt/caregiver will Perform Home Exercise Program: Left upper extremity;Independently;With written HEP provided Additional ADL Goal #1: Pt to continue demonstrating ADL completion MOD I Additional ADL Goal #2: Pt to demonstrate safe and independent positioning of L UE during activity and while at rest   OT Frequency:  Min 2X/week    Co-evaluation              AM-PAC OT 6 Clicks Daily Activity     Outcome Measure Help from another person eating meals?: None Help from another person taking care of personal grooming?: None Help from another person toileting, which includes using toliet, bedpan, or urinal?: None Help from another person bathing (including washing, rinsing, drying)?: A Little Help from another person to put on and taking off regular upper body clothing?: None Help from another person to put on and taking off regular lower body clothing?: None 6 Click Score: 23   End of Session Nurse Communication: Mobility status  Activity Tolerance: Patient tolerated treatment well Patient left: in chair;with call bell/phone within reach  OT Visit Diagnosis: Muscle weakness (generalized) (M62.81);Pain  Pain - Right/Left: Left Pain - part of body: Shoulder                Time: 9277-9255 OT Time Calculation (min): 22 min Charges:  OT General Charges $OT Visit: 1 Visit OT Evaluation $OT Eval Moderate Complexity: 1 Mod  Mliss NOVAK, OTR/L Acute Rehab Services Office: [phone]   Mliss Fish 03/14/2024, 8:03 AM

## 2024-03-14 NOTE — Plan of Care (Signed)
  Problem: Education: Goal: Knowledge of General Education information will improve Description: Including pain rating scale, medication(s)/side effects and non-pharmacologic comfort measures Outcome: Progressing   Problem: Health Behavior/Discharge Planning: Goal: Ability to manage health-related needs will improve Outcome: Progressing   Problem: Clinical Measurements: Goal: Ability to maintain clinical measurements within normal limits will improve Outcome: Progressing Goal: Will remain free from infection Outcome: Progressing Goal: Diagnostic test results will improve Outcome: Progressing Goal: Respiratory complications will improve Outcome: Progressing Goal: Cardiovascular complication will be avoided Outcome: Progressing   Problem: Activity: Goal: Risk for activity intolerance will decrease Outcome: Progressing   Problem: Nutrition: Goal: Adequate nutrition will be maintained Outcome: Progressing   Problem: Coping: Goal: Level of anxiety will decrease Outcome: Progressing   Problem: Elimination: Goal: Will not experience complications related to bowel motility Outcome: Progressing Goal: Will not experience complications related to urinary retention Outcome: Progressing   Problem: Pain Managment: Goal: General experience of comfort will improve and/or be controlled Outcome: Progressing   Problem: Safety: Goal: Ability to remain free from injury will improve Outcome: Progressing   Problem: Education: Goal: Ability to describe self-care measures that may prevent or decrease complications (Diabetes Survival Skills Education) will improve Outcome: Progressing Goal: Individualized Educational Video(s) Outcome: Progressing   Problem: Coping: Goal: Ability to adjust to condition or change in health will improve Outcome: Progressing   Problem: Fluid Volume: Goal: Ability to maintain a balanced intake and output will improve Outcome: Progressing   Problem:  Metabolic: Goal: Ability to maintain appropriate glucose levels will improve Outcome: Progressing   Problem: Nutritional: Goal: Maintenance of adequate nutrition will improve Outcome: Progressing Goal: Progress toward achieving an optimal weight will improve Outcome: Progressing

## 2024-03-14 NOTE — Hospital Course (Signed)
 CIWA added Change to dilaudid  from morphine 

## 2024-03-14 NOTE — Progress Notes (Addendum)
  Progress Note    03/14/2024 6:54 AM 3 Days Post-Op  Subjective:  sitting up in chair in no distress; says this is the worst pain  Afebrile   Vitals:   03/13/24 2056 03/14/24 0533  BP: 137/73 125/75  Pulse: 91 80  Resp: 18 18  Temp: 98.6 F (37 C) 98.4 F (36.9 C)  SpO2: 94% 92%    Physical Exam: General:  no distress Cardiac:  regular Lungs:  non labored Incisions:  bandaged; extensive ecchymosis left lateral side from shoulder to hip Extremities:  palpable left radial pulse    CBC    Component Value Date/Time   WBC 16.6 (H) 03/13/2024 0832   RBC 3.89 (L) 03/13/2024 0832   HGB 11.9 (L) 03/13/2024 0832   HGB 17.1 10/28/2023 0809   HCT 35.5 (L) 03/13/2024 0832   HCT 50.7 10/28/2023 0809   PLT 171 03/13/2024 0832   PLT 175 10/28/2023 0809   MCV 91.3 03/13/2024 0832   MCV 91 10/28/2023 0809   MCH 30.6 03/13/2024 0832   MCHC 33.5 03/13/2024 0832   RDW 13.6 03/13/2024 0832   RDW 12.8 10/28/2023 0809   LYMPHSABS 0.9 03/12/2024 0643   LYMPHSABS 1.6 10/28/2023 0809   MONOABS 0.6 03/12/2024 0643   EOSABS 0.0 03/12/2024 0643   EOSABS 0.2 10/28/2023 0809   BASOSABS 0.0 03/12/2024 0643   BASOSABS 0.0 10/28/2023 0809    BMET    Component Value Date/Time   NA 133 (L) 03/13/2024 0828   NA 138 10/28/2023 0809   K 4.0 03/13/2024 0828   CL 101 03/13/2024 0828   CO2 22 03/13/2024 0828   GLUCOSE 146 (H) 03/13/2024 0828   BUN 12 03/13/2024 0828   BUN 15 10/28/2023 0809   CREATININE 0.62 03/13/2024 0828   CREATININE 0.68 (L) 09/29/2022 0836   CALCIUM  8.8 (L) 03/13/2024 0828   GFRNONAA >60 03/13/2024 0828   GFRNONAA 108 06/21/2020 0707   GFRAA 127 10/24/2020 0811   GFRAA 125 06/21/2020 0707    INR    Component Value Date/Time   INR 1.0 03/02/2024 1920     Intake/Output Summary (Last 24 hours) at 03/14/2024 0654 Last data filed at 03/14/2024 0258 Gross per 24 hour  Intake 960 ml  Output 450 ml  Net 510 ml    JP output:  150cc/24hr (20cc 1st shift and  130cc 2nd shift)   Assessment/Plan:  58 y.o. male is s/p:  Open exposure and repair of left axillary psa 03/12/2024 by Dr. Serene  3 Days Post-Op   -pt with palpable left radial pulse.  Subjectively, motor and sensory are improving.   -still with increased JP drainage-keep JP drain until decreased.   -DVT prophylaxis:  sq heparin    Lucie Apt, PA-C [facility] [phone] 03/14/2024 6:54 AM  I agree with the above.  I have seen and evaluated the patient.  JP drainage is scant and continues to decrease.  I will monitor output and remove when appropriate.  He continues to have a palpable left radial pulse.  Arm function is improving  Wells Ellar Hakala

## 2024-03-14 NOTE — Plan of Care (Signed)

## 2024-03-15 DIAGNOSIS — D62 Acute posthemorrhagic anemia: Secondary | ICD-10-CM

## 2024-03-15 DIAGNOSIS — S45002A Unspecified injury of axillary artery, left side, initial encounter: Secondary | ICD-10-CM | POA: Diagnosis not present

## 2024-03-15 LAB — CBC
HCT: 33.2 % — ABNORMAL LOW (ref 39.0–52.0)
Hemoglobin: 11.1 g/dL — ABNORMAL LOW (ref 13.0–17.0)
MCH: 30.3 pg (ref 26.0–34.0)
MCHC: 33.4 g/dL (ref 30.0–36.0)
MCV: 90.7 fL (ref 80.0–100.0)
Platelets: 187 K/uL (ref 150–400)
RBC: 3.66 MIL/uL — ABNORMAL LOW (ref 4.22–5.81)
RDW: 13.4 % (ref 11.5–15.5)
WBC: 14.3 K/uL — ABNORMAL HIGH (ref 4.0–10.5)
nRBC: 0 % (ref 0.0–0.2)

## 2024-03-15 LAB — BASIC METABOLIC PANEL WITH GFR
Anion gap: 8 (ref 5–15)
BUN: 11 mg/dL (ref 6–20)
CO2: 27 mmol/L (ref 22–32)
Calcium: 9 mg/dL (ref 8.9–10.3)
Chloride: 97 mmol/L — ABNORMAL LOW (ref 98–111)
Creatinine, Ser: 0.66 mg/dL (ref 0.61–1.24)
GFR, Estimated: >60 mL/min (ref >60)
Glucose, Bld: 157 mg/dL — ABNORMAL HIGH (ref 70–99)
Potassium: 3.7 mmol/L (ref 3.5–5.1)
Sodium: 132 mmol/L — ABNORMAL LOW (ref 135–145)

## 2024-03-15 LAB — GLUCOSE, CAPILLARY
Glucose-Capillary: 150 mg/dL — ABNORMAL HIGH (ref 70–99)
Glucose-Capillary: 192 mg/dL — ABNORMAL HIGH (ref 70–99)
Glucose-Capillary: 148 mg/dL — ABNORMAL HIGH (ref 70–99)
Glucose-Capillary: 170 mg/dL — ABNORMAL HIGH (ref 70–99)

## 2024-03-15 NOTE — Progress Notes (Addendum)
  Progress Note    03/15/2024 6:56 AM 4 Days Post-Op  Subjective:  says he slept with his arm down where he wasn't having any pain.    afebrile  Vitals:   03/14/24 2054 03/15/24 0600  BP: 102/74 121/69  Pulse: 87 83  Resp:  16  Temp:  97.9 F (36.6 C)  SpO2:  94%    Physical Exam: General:  no distress Cardiac:  regular Lungs:  non labored Incisions:  bandaged Extremities:  easily palpable left radial pulse.  Unable to squeeze fingers today.  More swelling in hand.     CBC    Component Value Date/Time   WBC 14.3 (H) 03/15/2024 0614   RBC 3.66 (L) 03/15/2024 0614   HGB 11.1 (L) 03/15/2024 0614   HGB 17.1 10/28/2023 0809   HCT 33.2 (L) 03/15/2024 0614   HCT 50.7 10/28/2023 0809   PLT 187 03/15/2024 0614   PLT 175 10/28/2023 0809   MCV 90.7 03/15/2024 0614   MCV 91 10/28/2023 0809   MCH 30.3 03/15/2024 0614   MCHC 33.4 03/15/2024 0614   RDW 13.4 03/15/2024 0614   RDW 12.8 10/28/2023 0809   LYMPHSABS 0.9 03/12/2024 0643   LYMPHSABS 1.6 10/28/2023 0809   MONOABS 0.6 03/12/2024 0643   EOSABS 0.0 03/12/2024 0643   EOSABS 0.2 10/28/2023 0809   BASOSABS 0.0 03/12/2024 0643   BASOSABS 0.0 10/28/2023 0809    BMET    Component Value Date/Time   NA 134 (L) 03/14/2024 0632   NA 138 10/28/2023 0809   K 3.8 03/14/2024 0632   CL 101 03/14/2024 0632   CO2 24 03/14/2024 0632   GLUCOSE 152 (H) 03/14/2024 0632   BUN 9 03/14/2024 0632   BUN 15 10/28/2023 0809   CREATININE 0.60 (L) 03/14/2024 0632   CREATININE 0.68 (L) 09/29/2022 0836   CALCIUM  8.6 (L) 03/14/2024 0632   GFRNONAA >60 03/14/2024 0632   GFRNONAA 108 06/21/2020 0707   GFRAA 127 10/24/2020 0811   GFRAA 125 06/21/2020 0707    INR    Component Value Date/Time   INR 1.0 03/02/2024 1920     Intake/Output Summary (Last 24 hours) at 03/15/2024 0656 Last data filed at 03/14/2024 1940 Gross per 24 hour  Intake 0 ml  Output 130 ml  Net -130 ml     JP output:  130cc/24 hr.  130cc 1st shift and 30cc  2nd shift.    Assessment/Plan:  58 y.o. male is s/p:  Open exposure and repair of left axillary psa 03/12/2024 by Dr. Serene   4 Days Post-Op   -pt with easily palpable left radial pulse.  More swelling in left hand and unable to squeeze fingers.  If able, he would benefit from arm elevation.  -JP drain probably needs to stay another day.  130cc/24hr but down to 30cc last shift.  Continue to record drainage.   -hgb remains stable at 11.1 -DVT prophylaxis:  sq heparin  -continue asa/plavix chrystie   Lucie Apt, PA-C [facility] [phone] 03/15/2024 6:56 AM   I agree with the above Continue JP to bulb sxn until drainage significantly less May need ortho eval for shoulder issues  Malvina Serene

## 2024-03-15 NOTE — Progress Notes (Signed)
   03/15/24 2037  BiPAP/CPAP/SIPAP  BiPAP/CPAP/SIPAP Pt Type Adult  Reason BIPAP/CPAP not in use Non-compliant  BiPAP/CPAP /SiPAP Vitals  Pulse Rate 86  Resp 18  SpO2 93 %  Bilateral Breath Sounds Clear;Diminished  MEWS Score/Color  MEWS Score 0  MEWS Score Color Landy

## 2024-03-15 NOTE — Progress Notes (Signed)
 PROGRESS NOTE    Randy Hebert  FMW:[id number] DOB: 1966/08/23 DOA: 03/11/2024 PCP: Bluford Jacqulyn MATSU, DO    Brief Narrative:  58 y.o. male with history of diabetes mellitus type 2, hypertension, CAD, liver cirrhosis, anxiety, depression, OSA on CPAP, recent admission for stroke with left-sided weakness, discharged on 03/03/2024, who was brought to the ER after patient had a fall after tripping on a screw and landed on his left shoulder.  He was found to have left shoulder dislocation which was reduced in the emergency department.  He was also found to have left axillary hematoma with active extravasation on CT scan of left upper extremity.  Patient underwent repair of left axillary pseudoaneurysm on 7/12 by vascular surgery.  Subjective: Patient seen and examined.  He became very upset when I discussed about mobilizing his left shoulder, using sling while walking.  He told me that he wants to do what makes him comfortable.   Assessment & Plan:   Traumatic left axillary hematoma: Status post left axillary pseudoaneurysm repair 7/12 by vascular surgery. JP drain in place.  Followed by surgery.  Postop management as per surgery.  Elevate affected part. Adequate pain relief with IV and oral opiates.  Mobilize with PT OT.  Acute blood loss anemia: Hemoglobin 18-13-stabilized around 11.  Continue close monitoring.  Left shoulder dislocation: Reduced in the emergency room.  Stable. CT scan after reduction of the shoulder is stable.  Will advise gradual mobility.  Pain relief.  Recent right thalamic stroke with left paresis: Continued on aspirin  Plavix  and statin.  Work with PT OT.  Type 2 diabetes: On sliding scale.  Liver cirrhosis, combined heart failure: Well compensated at this time.   DVT prophylaxis: heparin  injection 5,000 Units Start: 03/12/24 1400 SCD's Start: 03/12/24 1020 SCDs Start: 03/12/24 0355   Code Status: Full code Family Communication: No family at the  bedside. Disposition Plan: Status is: Inpatient Remains inpatient appropriate because: Postop management     Consultants:  Vascular surgery  Procedures:  Aneurysm repair Shoulder relocation  Antimicrobials:  None     Objective: Vitals:   03/14/24 2000 03/14/24 2054 03/15/24 0600 03/15/24 0735  BP:  102/74 121/69 129/69  Pulse:  87 83 90  Resp:   16 18  Temp:   97.9 F (36.6 C) 98.6 F (37 C)  TempSrc:   Oral Oral  SpO2: 95%  94% (!) 89%  Weight:      Height:        Intake/Output Summary (Last 24 hours) at 03/15/2024 0846 Last data filed at 03/14/2024 1940 Gross per 24 hour  Intake 0 ml  Output 130 ml  Net -130 ml   Filed Weights   03/11/24 1455  Weight: 87.5 kg    Examination:  General: Looks comfortable but easily gets irritated.  Complains of pain with exam. Cardiovascular: S1-S2 normal.  Regular rate rhythm. Respiratory: Bilateral clear.  Extensive ecchymosis left axilla, anterior chest wall and midsternal chest wall.  JP drain with minimal bloody drainage. Gastrointestinal: Soft.  Nontender.  Bowel sound present. Ext: Swollen and ecchymotic left arm.  Distal vascular status intact. Left elbow and shoulder movements are intact.  Restricted by swelling.   Data Reviewed: I have personally reviewed following labs and imaging studies  CBC: Recent Labs  Lab 03/11/24 1613 03/12/24 0643 03/13/24 0828 03/13/24 0832 03/14/24 0632 03/14/24 1842 03/15/24 0614  WBC 24.5* 16.2* 16.7* 16.6* 12.2*  --  14.3*  NEUTROABS 21.0* 14.5*  --   --   --   --   --  HGB 18.0* 12.7* 11.9* 11.9* 11.2* 11.0* 11.1*  HCT 51.5 37.7* 35.1* 35.5* 33.3* 32.7* 33.2*  MCV 91.2 91.3 90.9 91.3 91.7  --  90.7  PLT 274 165 167 171 151  --  187   Basic Metabolic Panel: Recent Labs  Lab 03/11/24 1613 03/12/24 0643 03/13/24 0828 03/14/24 0632 03/15/24 0614  NA 136 134* 133* 134* 132*  K 3.6 4.3 4.0 3.8 3.7  CL 99 100 101 101 97*  CO2 20* 25 22 24 27   GLUCOSE 223* 192*  146* 152* 157*  BUN 12 16 12 9 11   CREATININE 0.85 0.82 0.62 0.60* 0.66  CALCIUM  9.3 8.8* 8.8* 8.6* 9.0   GFR: Estimated Creatinine Clearance: 101.1 mL/min (by C-G formula based on SCr of 0.66 mg/dL). Liver Function Tests: Recent Labs  Lab 03/11/24 1613  AST 36  ALT 46*  ALKPHOS 101  BILITOT 1.4*  PROT 7.8  ALBUMIN  4.0   No results for input(s): LIPASE, AMYLASE in the last 168 hours. No results for input(s): AMMONIA in the last 168 hours. Coagulation Profile: No results for input(s): INR, PROTIME in the last 168 hours. Cardiac Enzymes: No results for input(s): CKTOTAL, CKMB, CKMBINDEX, TROPONINI in the last 168 hours. BNP (last 3 results) No results for input(s): PROBNP in the last 8760 hours. HbA1C: No results for input(s): HGBA1C in the last 72 hours. CBG: Recent Labs  Lab 03/14/24 0549 03/14/24 1111 03/14/24 1654 03/14/24 2140 03/15/24 0621  GLUCAP 83 175* 168* 193* 150*   Lipid Profile: No results for input(s): CHOL, HDL, LDLCALC, TRIG, CHOLHDL, LDLDIRECT in the last 72 hours. Thyroid  Function Tests: No results for input(s): TSH, T4TOTAL, FREET4, T3FREE, THYROIDAB in the last 72 hours. Anemia Panel: No results for input(s): VITAMINB12, FOLATE, FERRITIN, TIBC, IRON, RETICCTPCT in the last 72 hours. Sepsis Labs: No results for input(s): PROCALCITON, LATICACIDVEN in the last 168 hours.  No results found for this or any previous visit (from the past 240 hours).       Radiology Studies: No results found.      Scheduled Meds:  amLODipine   10 mg Oral Daily   aspirin  EC  81 mg Oral Daily   atorvastatin   40 mg Oral Daily   citalopram   20 mg Oral Daily   clopidogrel   75 mg Oral Daily   ezetimibe   10 mg Oral Daily   folic acid   1 mg Oral Daily   heparin   5,000 Units Subcutaneous Q8H   insulin  aspart  0-9 Units Subcutaneous TID WC   multivitamin with minerals  1 tablet Oral Daily   thiamine   100  mg Oral Daily   Or   thiamine   100 mg Intravenous Daily   Continuous Infusions:   LOS: 3 days    Time spent: 52 minutes    Renato Applebaum, MD [facility]

## 2024-03-16 ENCOUNTER — Encounter (HOSPITAL_COMMUNITY)

## 2024-03-16 ENCOUNTER — Inpatient Hospital Stay (HOSPITAL_COMMUNITY)

## 2024-03-16 ENCOUNTER — Other Ambulatory Visit: Payer: Self-pay | Admitting: Cardiology

## 2024-03-16 DIAGNOSIS — S45002A Unspecified injury of axillary artery, left side, initial encounter: Secondary | ICD-10-CM | POA: Diagnosis not present

## 2024-03-16 DIAGNOSIS — D62 Acute posthemorrhagic anemia: Secondary | ICD-10-CM | POA: Diagnosis not present

## 2024-03-16 LAB — GLUCOSE, CAPILLARY
Glucose-Capillary: 196 mg/dL — ABNORMAL HIGH (ref 70–99)
Glucose-Capillary: 184 mg/dL — ABNORMAL HIGH (ref 70–99)
Glucose-Capillary: 175 mg/dL — ABNORMAL HIGH (ref 70–99)

## 2024-03-16 MED ORDER — NICOTINE 21 MG/24HR TD PT24
21.0000 mg | MEDICATED_PATCH | Freq: Every day | TRANSDERMAL | Status: DC
  Administered 2024-03-16 – 2024-03-19 (×4): 21 mg via TRANSDERMAL
  Filled 2024-03-16 (×4): qty 1

## 2024-03-16 MED ORDER — OXYCODONE HCL 5 MG PO TABS
5.0000 mg | ORAL_TABLET | Freq: Once | ORAL | Status: AC
  Administered 2024-03-16: 5 mg via ORAL
  Filled 2024-03-16: qty 1

## 2024-03-16 MED ORDER — CYCLOBENZAPRINE HCL 5 MG PO TABS
5.0000 mg | ORAL_TABLET | Freq: Three times a day (TID) | ORAL | Status: DC
  Administered 2024-03-16 – 2024-03-17 (×4): 5 mg via ORAL
  Filled 2024-03-16 (×4): qty 1

## 2024-03-16 NOTE — Progress Notes (Signed)
 Occupational Therapy Treatment/Discharge Patient Details Name: Randy Hebert MRN: [id number] DOB: 02-07-1966 Today's Date: 03/16/2024   History of present illness Pt is a 58 y/o male presenting after a fall resulting in L shoulder dislocation. L shoulder reduced in ED, found to have L axillary hematoma. Underwent repair of left axillary pseudoaneurysm and evacuation of L axillary hematoma in 7/12. PMH: Recent CVA (discharged 7/30), DM2, CAD, CHF, COPD, GERD, NSTEMI, MI   OT comments  Pt resting in recliner, no significant change to LUE from yesterday, continues to be swollen, little to no AROM, able to wiggle fingers some. Pt reports PROM with elbow flexion earlier resulted in severe pain and burning sensation to arm and cannot put arm in sling due to being unable to bend at elbow enough to do so. Pt educated on positioning to reduce swelling, HEP with AROM/PROM to maintain strength and prevent stiffening. Son has been helping with don/doff shirts, Pt able to complete all other ADLs mod I with increased time using RUE. At this time Pt has no further acute OT needs, follow physicians rec for follow up therapies, would benefit from OP OT. Pt reports no need for DME for return home.       If plan is discharge home, recommend the following:  Assistance with cooking/housework;Assist for transportation   Equipment Recommendations  None recommended by OT    Recommendations for Other Services      Precautions / Restrictions Precautions Precautions: Fall Recall of Precautions/Restrictions: Intact Precaution/Restrictions Comments: L shoulder JP drain Required Braces or Orthoses: Sling Restrictions Weight Bearing Restrictions Per Provider Order: Yes LUE Weight Bearing Per Provider Order: Non weight bearing       Mobility Bed Mobility Overal bed mobility: Independent                  Transfers Overall transfer level: Independent Equipment used: None                      Balance Overall balance assessment: No apparent balance deficits (not formally assessed)                                         ADL either performed or assessed with clinical judgement   ADL Overall ADL's : Modified independent;Needs assistance/impaired                                       General ADL Comments: overall mod I, min A for don/doff shirt, family can assist at home    Extremity/Trunk Assessment Upper Extremity Assessment Upper Extremity Assessment: LUE deficits/detail LUE Deficits / Details: bruising around L shoulder, swelling throughout. able to wiggle fingers, move wrist. elbow flexion limited by pain and swelling. reports improvements in movement since admission and denies abnormal sensation. hx of CVA affecting this side LUE Sensation: history of peripheral neuropathy LUE Coordination: decreased fine motor;decreased gross motor   Lower Extremity Assessment Lower Extremity Assessment: Defer to PT evaluation        Vision       Perception     Praxis     Communication Communication Communication: No apparent difficulties   Cognition Arousal: Alert Behavior During Therapy: WFL for tasks assessed/performed Cognition: No apparent impairments  Following commands: Intact        Cueing   Cueing Techniques: Verbal cues  Exercises      Shoulder Instructions       General Comments      Pertinent Vitals/ Pain       Pain Assessment Pain Assessment: Faces Faces Pain Scale: Hurts little more Pain Location: L shoulder Pain Descriptors / Indicators: Grimacing, Guarding Pain Intervention(s): Monitored during session  Home Living                                          Prior Functioning/Environment              Frequency  Min 2X/week        Progress Toward Goals  OT Goals(current goals can now be found in the care plan section)  Progress  towards OT goals: Progressing toward goals;Goals met/education completed, patient discharged from OT  Acute Rehab OT Goals Patient Stated Goal: to manage pain OT Goal Formulation: With patient/family Time For Goal Achievement: 03/28/24 Potential to Achieve Goals: Good ADL Goals Pt/caregiver will Perform Home Exercise Program: Left upper extremity;Independently;With written HEP provided Additional ADL Goal #1: Pt to continue demonstrating ADL completion MOD I Additional ADL Goal #2: Pt to demonstrate safe and independent positioning of L UE during activity and while at rest  Plan      Co-evaluation                 AM-PAC OT 6 Clicks Daily Activity     Outcome Measure   Help from another person eating meals?: None Help from another person taking care of personal grooming?: None Help from another person toileting, which includes using toliet, bedpan, or urinal?: None Help from another person bathing (including washing, rinsing, drying)?: A Little Help from another person to put on and taking off regular upper body clothing?: None Help from another person to put on and taking off regular lower body clothing?: None 6 Click Score: 23    End of Session    OT Visit Diagnosis: Muscle weakness (generalized) (M62.81);Pain Pain - Right/Left: Left Pain - part of body: Shoulder   Activity Tolerance Patient tolerated treatment well   Patient Left in chair;with call bell/phone within reach;with family/visitor present   Nurse Communication Mobility status        Time: 8876-8862 OT Time Calculation (min): 14 min  Charges: OT General Charges $OT Visit: 1 Visit OT Treatments $Therapeutic Activity: 8-22 mins  Lyfe Monger, OTR/L   Elouise JONELLE Bott 03/16/2024, 11:42 AM

## 2024-03-16 NOTE — Progress Notes (Signed)
 Pt refused to wear sling throughout the night. Pt educated.

## 2024-03-16 NOTE — Progress Notes (Signed)
 PROGRESS NOTE    Randy Hebert  FMW:[id number] DOB: December 28, 1965 DOA: 03/11/2024 PCP: Bluford Jacqulyn MATSU, DO    Brief Narrative:  58 y.o. male with history of diabetes mellitus type 2, hypertension, CAD, liver cirrhosis, anxiety, depression, OSA on CPAP, recent admission for stroke with left-sided weakness, discharged on 03/03/2024, who was brought to the ER after patient had a fall after tripping on a screw and landed on his left shoulder.  He was found to have left shoulder dislocation which was reduced in the emergency department.  He was also found to have left axillary hematoma with active extravasation on CT scan of left upper extremity.  Patient underwent repair of left axillary pseudoaneurysm on 7/12 by vascular surgery.  Subjective:  Patient seen and examined.  His son was at the bedside. Patient tells me that he hate to go through all of this.  Continues to have pain.  He thinks we should look at his bones.  There is no clinical evidence of skeletal fracture or dislocation of either elbow or shoulder.  Repeated a portable shoulder x-ray and joint looks intact. Will add Flexeril  on the pain regimen  to help him mobilize.   Assessment & Plan:   Traumatic left axillary hematoma: Status post left axillary pseudoaneurysm repair 7/12 by vascular surgery. JP drain in place.  Followed by surgery.  Postop management as per surgery.  Elevate affected part. Adequate pain relief with IV and oral opiates.  Mobilize with PT OT. Adding Flexeril .  Acute blood loss anemia: Hemoglobin 18-13-stabilized around 11.  Continue close monitoring.  Left shoulder dislocation: Reduced in the emergency room.  Stable. CT scan after reduction of the shoulder is stable.  Start gradual mobility.  Patient is hesitant to mobilize.  Repeat x-ray stable.  Recent right thalamic stroke with left paresis: Continued on aspirin  Plavix  and statin.  Work with PT OT.  Type 2 diabetes: On sliding scale.  Liver cirrhosis,  combined heart failure: Well compensated at this time.   DVT prophylaxis: heparin  injection 5,000 Units Start: 03/12/24 1400 SCD's Start: 03/12/24 1020 SCDs Start: 03/12/24 0355   Code Status: Full code Family Communication: Son at the bedside. Disposition Plan: Status is: Inpatient Remains inpatient appropriate because: Postop management     Consultants:  Vascular surgery  Procedures:  Aneurysm repair Shoulder relocation  Antimicrobials:  None     Objective: Vitals:   03/15/24 2017 03/15/24 2037 03/16/24 0614 03/16/24 0843  BP: 137/64  128/80 (!) 145/83  Pulse: 86 86 93 93  Resp: 18 18 20 20   Temp: 98.5 F (36.9 C)  97.8 F (36.6 C) 97.8 F (36.6 C)  TempSrc: Oral  Oral Oral  SpO2: 93% 93% 92% 94%  Weight:      Height:        Intake/Output Summary (Last 24 hours) at 03/16/2024 1146 Last data filed at 03/16/2024 0700 Gross per 24 hour  Intake 0 ml  Output 55 ml  Net -55 ml   Filed Weights   03/11/24 1455  Weight: 87.5 kg    Examination:  General: Comfortably laying in the couch with left hand supported straight. Cardiovascular: S1-S2 normal.  Regular rate rhythm. Respiratory: Bilateral clear.  Extensive ecchymosis left axilla, anterior chest wall and midsternal chest wall.  JP drain with minimal bloody drainage. Gastrointestinal: Soft.  Nontender.  Bowel sound present. Ext: Swollen and ecchymotic left arm.  Distal vascular status intact. Left elbow and shoulder movements are intact.  Restricted by swelling.   Data  Reviewed: I have personally reviewed following labs and imaging studies  CBC: Recent Labs  Lab 03/11/24 1613 03/12/24 0643 03/13/24 0828 03/13/24 0832 03/14/24 0632 03/14/24 1842 03/15/24 0614  WBC 24.5* 16.2* 16.7* 16.6* 12.2*  --  14.3*  NEUTROABS 21.0* 14.5*  --   --   --   --   --   HGB 18.0* 12.7* 11.9* 11.9* 11.2* 11.0* 11.1*  HCT 51.5 37.7* 35.1* 35.5* 33.3* 32.7* 33.2*  MCV 91.2 91.3 90.9 91.3 91.7  --  90.7  PLT 274  165 167 171 151  --  187   Basic Metabolic Panel: Recent Labs  Lab 03/11/24 1613 03/12/24 0643 03/13/24 0828 03/14/24 0632 03/15/24 0614  NA 136 134* 133* 134* 132*  K 3.6 4.3 4.0 3.8 3.7  CL 99 100 101 101 97*  CO2 20* 25 22 24 27   GLUCOSE 223* 192* 146* 152* 157*  BUN 12 16 12 9 11   CREATININE 0.85 0.82 0.62 0.60* 0.66  CALCIUM  9.3 8.8* 8.8* 8.6* 9.0   GFR: Estimated Creatinine Clearance: 101.1 mL/min (by C-G formula based on SCr of 0.66 mg/dL). Liver Function Tests: Recent Labs  Lab 03/11/24 1613  AST 36  ALT 46*  ALKPHOS 101  BILITOT 1.4*  PROT 7.8  ALBUMIN  4.0   No results for input(s): LIPASE, AMYLASE in the last 168 hours. No results for input(s): AMMONIA in the last 168 hours. Coagulation Profile: No results for input(s): INR, PROTIME in the last 168 hours. Cardiac Enzymes: No results for input(s): CKTOTAL, CKMB, CKMBINDEX, TROPONINI in the last 168 hours. BNP (last 3 results) No results for input(s): PROBNP in the last 8760 hours. HbA1C: No results for input(s): HGBA1C in the last 72 hours. CBG: Recent Labs  Lab 03/15/24 1134 03/15/24 1741 03/15/24 2139 03/16/24 0536 03/16/24 1138  GLUCAP 192* 148* 170* 196* 184*   Lipid Profile: No results for input(s): CHOL, HDL, LDLCALC, TRIG, CHOLHDL, LDLDIRECT in the last 72 hours. Thyroid  Function Tests: No results for input(s): TSH, T4TOTAL, FREET4, T3FREE, THYROIDAB in the last 72 hours. Anemia Panel: No results for input(s): VITAMINB12, FOLATE, FERRITIN, TIBC, IRON, RETICCTPCT in the last 72 hours. Sepsis Labs: No results for input(s): PROCALCITON, LATICACIDVEN in the last 168 hours.  No results found for this or any previous visit (from the past 240 hours).       Radiology Studies: DG Shoulder Left Port Result Date: 03/16/2024 CLINICAL DATA:  Dislocation EXAM: LEFT SHOULDER COMPARISON:  Plain films and CT 03/11/2024 FINDINGS: No acute  bony abnormality. Specifically, no fracture, subluxation, or dislocation. Degenerative changes in the left AC and glenohumeral joints. Postoperative changes in the left axilla with skin staples and surgical drain in place. IMPRESSION: No acute bony abnormality. Postoperative changes in the left axilla. Electronically Signed   By: Franky Crease M.D.   On: 03/16/2024 11:30        Scheduled Meds:  amLODipine   10 mg Oral Daily   aspirin  EC  81 mg Oral Daily   atorvastatin   40 mg Oral Daily   citalopram   20 mg Oral Daily   clopidogrel   75 mg Oral Daily   cyclobenzaprine   5 mg Oral TID   ezetimibe   10 mg Oral Daily   folic acid   1 mg Oral Daily   heparin   5,000 Units Subcutaneous Q8H   insulin  aspart  0-9 Units Subcutaneous TID WC   multivitamin with minerals  1 tablet Oral Daily   nicotine   21 mg Transdermal Daily  thiamine   100 mg Oral Daily   Or   thiamine   100 mg Intravenous Daily   Continuous Infusions:   LOS: 4 days    Time spent: 52 minutes    Renato Applebaum, MD [facility]

## 2024-03-16 NOTE — Progress Notes (Signed)
 Pt c/o pain 10/10 to his left shoulder. Dilaudid  1mg  given at 0352, and Tylenol  650mg  given at 0405. Per pt pain still 10/10. Pt requesting additional pain med.  No change in skin color to left arm, brachial and radial pulses +2, pt able to move extremity. New order placed for pain medicine. Pt asleep 20 min after pain med administered.

## 2024-03-16 NOTE — Progress Notes (Addendum)
  Progress Note    03/16/2024 6:53 AM 5 Days Post-Op  Subjective:  says he feels like his shoulder needs to be looked at again.  More pain overnight.   afebrile  Vitals:   03/15/24 2037 03/16/24 0614  BP:  128/80  Pulse: 86 93  Resp: 18 20  Temp:  97.8 F (36.6 C)  SpO2: 93% 92%    Physical Exam: General:  no distress Cardiac:  regular Lungs:  non labored Incisions:  left chest incision is clean and dry Extremities:  easily palpable left radial pulse.  Arm is soft throughout.  Still with some swelling in the left hand   CBC    Component Value Date/Time   WBC 14.3 (H) 03/15/2024 0614   RBC 3.66 (L) 03/15/2024 0614   HGB 11.1 (L) 03/15/2024 0614   HGB 17.1 10/28/2023 0809   HCT 33.2 (L) 03/15/2024 0614   HCT 50.7 10/28/2023 0809   PLT 187 03/15/2024 0614   PLT 175 10/28/2023 0809   MCV 90.7 03/15/2024 0614   MCV 91 10/28/2023 0809   MCH 30.3 03/15/2024 0614   MCHC 33.4 03/15/2024 0614   RDW 13.4 03/15/2024 0614   RDW 12.8 10/28/2023 0809   LYMPHSABS 0.9 03/12/2024 0643   LYMPHSABS 1.6 10/28/2023 0809   MONOABS 0.6 03/12/2024 0643   EOSABS 0.0 03/12/2024 0643   EOSABS 0.2 10/28/2023 0809   BASOSABS 0.0 03/12/2024 0643   BASOSABS 0.0 10/28/2023 0809    BMET    Component Value Date/Time   NA 132 (L) 03/15/2024 0614   NA 138 10/28/2023 0809   K 3.7 03/15/2024 0614   CL 97 (L) 03/15/2024 0614   CO2 27 03/15/2024 0614   GLUCOSE 157 (H) 03/15/2024 0614   BUN 11 03/15/2024 0614   BUN 15 10/28/2023 0809   CREATININE 0.66 03/15/2024 0614   CREATININE 0.68 (L) 09/29/2022 0836   CALCIUM  9.0 03/15/2024 0614   GFRNONAA >60 03/15/2024 0614   GFRNONAA 108 06/21/2020 0707   GFRAA 127 10/24/2020 0811   GFRAA 125 06/21/2020 0707    INR    Component Value Date/Time   INR 1.0 03/02/2024 1920     Intake/Output Summary (Last 24 hours) at 03/16/2024 0653 Last data filed at 03/15/2024 1454 Gross per 24 hour  Intake 240 ml  Output 25 ml  Net 215 ml       Assessment/Plan:  58 y.o. male is s/p:  Open exposure and repair of left axillary psa 03/12/2024 by Dr. Serene    5 Days Post-Op   -pt continues to have easily palpable left radial pulse. Swelling in hand but he is unable to elevate.  JP drain with 50cc / 24hrs.  Will d/w Dr. Serene but most likely drain out either today or tomorrow.   -discussed with [facility] and will plan for 2 view xray of shoulder. -DVT prophylaxis:  sq heparin    Lucie Apt, PA-C [facility] [phone] 03/16/2024 6:53 AM   I agree with the above.  I have seen and evaluated the patient.  He has a palpable radial pulse.  Continue to monitor his drain output.  I would like for it to be less than 30 cc/day before removing it.  Malvina Serene

## 2024-03-17 DIAGNOSIS — S45002A Unspecified injury of axillary artery, left side, initial encounter: Secondary | ICD-10-CM | POA: Diagnosis not present

## 2024-03-17 DIAGNOSIS — D62 Acute posthemorrhagic anemia: Secondary | ICD-10-CM | POA: Diagnosis not present

## 2024-03-17 LAB — CBC WITH DIFFERENTIAL/PLATELET
Abs Immature Granulocytes: 0.13 K/uL — ABNORMAL HIGH (ref 0.00–0.07)
Basophils Absolute: 0.1 K/uL (ref 0.0–0.1)
Basophils Relative: 0 %
Eosinophils Absolute: 0.2 K/uL (ref 0.0–0.5)
Eosinophils Relative: 2 %
HCT: 32.7 % — ABNORMAL LOW (ref 39.0–52.0)
Hemoglobin: 11.1 g/dL — ABNORMAL LOW (ref 13.0–17.0)
Immature Granulocytes: 1 %
Lymphocytes Relative: 17 %
Lymphs Abs: 2.2 K/uL (ref 0.7–4.0)
MCH: 31 pg (ref 26.0–34.0)
MCHC: 33.9 g/dL (ref 30.0–36.0)
MCV: 91.3 fL (ref 80.0–100.0)
Monocytes Absolute: 1.2 K/uL — ABNORMAL HIGH (ref 0.1–1.0)
Monocytes Relative: 10 %
Neutro Abs: 8.8 K/uL — ABNORMAL HIGH (ref 1.7–7.7)
Neutrophils Relative %: 70 %
Platelets: 252 K/uL (ref 150–400)
RBC: 3.58 MIL/uL — ABNORMAL LOW (ref 4.22–5.81)
RDW: 14 % (ref 11.5–15.5)
WBC: 12.7 K/uL — ABNORMAL HIGH (ref 4.0–10.5)
nRBC: 0.2 % (ref 0.0–0.2)

## 2024-03-17 LAB — GLUCOSE, CAPILLARY
Glucose-Capillary: 161 mg/dL — ABNORMAL HIGH (ref 70–99)
Glucose-Capillary: 166 mg/dL — ABNORMAL HIGH (ref 70–99)
Glucose-Capillary: 140 mg/dL — ABNORMAL HIGH (ref 70–99)
Glucose-Capillary: 228 mg/dL — ABNORMAL HIGH (ref 70–99)

## 2024-03-17 MED ORDER — HYDROCORTISONE 0.5 % EX CREA
TOPICAL_CREAM | Freq: Two times a day (BID) | CUTANEOUS | Status: DC | PRN
  Filled 2024-03-17: qty 28.35

## 2024-03-17 MED ORDER — HYDROCORTISONE 1 % EX CREA
TOPICAL_CREAM | Freq: Two times a day (BID) | CUTANEOUS | Status: DC | PRN
  Filled 2024-03-17: qty 28

## 2024-03-17 MED ORDER — CYCLOBENZAPRINE HCL 5 MG PO TABS
7.5000 mg | ORAL_TABLET | Freq: Three times a day (TID) | ORAL | Status: DC
  Administered 2024-03-17 – 2024-03-19 (×6): 7.5 mg via ORAL
  Filled 2024-03-17 (×6): qty 2

## 2024-03-17 NOTE — Progress Notes (Signed)
 PROGRESS NOTE    Randy Hebert  FMW:[id number] DOB: [birth year] DOA: 03/11/2024 PCP: Bluford Jacqulyn MATSU, DO    Brief Narrative:  58 y.o. male with history of diabetes mellitus type 2, hypertension, CAD, liver cirrhosis, anxiety, depression, OSA on CPAP, recent admission for stroke with left-sided weakness, discharged on 03/03/2024, who was brought to the ER after patient had a fall after tripping on a screw and landed on his left shoulder.  He was found to have left shoulder dislocation which was reduced in the emergency department.  He was also found to have left axillary hematoma with active extravasation on CT scan of left upper extremity.  Patient underwent repair of left axillary pseudoaneurysm on 7/12 by vascular surgery.  Subjective:  Patient seen and examined.  Left arm is still difficult, however he looks more comfortable and he tells me he is mobilizing it more.  Adding Flexeril  did help.   Assessment & Plan:   Traumatic left axillary hematoma: Status post left axillary pseudoaneurysm repair 7/12 by vascular surgery. JP drain in place.  Followed by surgery.  Postop management as per surgery.  Elevate affected part. Adequate pain relief with IV and oral opiates.  Mobilize with PT OT. Adding Flexeril .  Increase dose to 7.5 mg 3 times daily.  Acute blood loss anemia: Hemoglobin 18-13-stabilized around 11.  Continue close monitoring.  Left shoulder dislocation: Reduced in the emergency room.  Stable. CT scan after reduction of the shoulder is stable.  Start gradual mobility.  Patient is hesitant to mobilize.  Repeat x-ray stable.  Recent right thalamic stroke with left paresis: Continued on aspirin  Plavix  and statin.  Work with PT OT.  Type 2 diabetes: On sliding scale.  Liver cirrhosis, combined heart failure: Well compensated at this time.   DVT prophylaxis: heparin  injection 5,000 Units Start: 03/12/24 1400 SCD's Start: 03/12/24 1020 SCDs Start: 03/12/24 0355   Code  Status: Full code Family Communication: None today. Disposition Plan: Status is: Inpatient Remains inpatient appropriate because: Postop management     Consultants:  Vascular surgery  Procedures:  Aneurysm repair Shoulder relocation  Antimicrobials:  None     Objective: Vitals:   03/16/24 1451 03/16/24 1939 03/17/24 0444 03/17/24 0813  BP: 115/65 132/67 (P) 134/71 112/68  Pulse: 66 96 (P) 94 97  Resp: 18 18 (P) 18 17  Temp: 98 F (36.7 C) 98.8 F (37.1 C) (P) 98.6 F (37 C) 98.2 F (36.8 C)  TempSrc:  Oral (P) Oral Oral  SpO2: 96% 95% (P) 94% 91%  Weight:      Height:        Intake/Output Summary (Last 24 hours) at 03/17/2024 1351 Last data filed at 03/17/2024 1100 Gross per 24 hour  Intake 420 ml  Output 20 ml  Net 400 ml   Filed Weights   03/11/24 1455  Weight: 87.5 kg    Examination:  General: Comfortably laying in the couch.  Pleasant interactive today. Cardiovascular: S1-S2 normal.  Regular rate rhythm. Respiratory: Bilateral clear.  Extensive ecchymosis left axilla, anterior chest wall and midsternal chest wall.  JP drain with minimal bloody drainage. Gastrointestinal: Soft.  Nontender.  Bowel sound present. Ext: Swollen and ecchymotic left arm.  Distal vascular status intact. Left elbow and shoulder movements are intact.  Some restriction due to swelling.   Data Reviewed: I have personally reviewed following labs and imaging studies  CBC: Recent Labs  Lab 03/11/24 1613 03/12/24 9356 03/13/24 9171 03/13/24 9167 03/14/24 9367 03/14/24 1842 03/15/24 9385  03/17/24 0607  WBC 24.5* 16.2* 16.7* 16.6* 12.2*  --  14.3* 12.7*  NEUTROABS 21.0* 14.5*  --   --   --   --   --  8.8*  HGB 18.0* 12.7* 11.9* 11.9* 11.2* 11.0* 11.1* 11.1*  HCT 51.5 37.7* 35.1* 35.5* 33.3* 32.7* 33.2* 32.7*  MCV 91.2 91.3 90.9 91.3 91.7  --  90.7 91.3  PLT 274 165 167 171 151  --  187 252   Basic Metabolic Panel: Recent Labs  Lab 03/11/24 1613 03/12/24 0643  03/13/24 0828 03/14/24 0632 03/15/24 0614  NA 136 134* 133* 134* 132*  K 3.6 4.3 4.0 3.8 3.7  CL 99 100 101 101 97*  CO2 20* 25 22 24 27   GLUCOSE 223* 192* 146* 152* 157*  BUN 12 16 12 9 11   CREATININE 0.85 0.82 0.62 0.60* 0.66  CALCIUM  9.3 8.8* 8.8* 8.6* 9.0   GFR: Estimated Creatinine Clearance: 101.1 mL/min (by C-G formula based on SCr of 0.66 mg/dL). Liver Function Tests: Recent Labs  Lab 03/11/24 1613  AST 36  ALT 46*  ALKPHOS 101  BILITOT 1.4*  PROT 7.8  ALBUMIN  4.0   No results for input(s): LIPASE, AMYLASE in the last 168 hours. No results for input(s): AMMONIA in the last 168 hours. Coagulation Profile: No results for input(s): INR, PROTIME in the last 168 hours. Cardiac Enzymes: No results for input(s): CKTOTAL, CKMB, CKMBINDEX, TROPONINI in the last 168 hours. BNP (last 3 results) No results for input(s): PROBNP in the last 8760 hours. HbA1C: No results for input(s): HGBA1C in the last 72 hours. CBG: Recent Labs  Lab 03/16/24 0536 03/16/24 1138 03/16/24 2143 03/17/24 0626 03/17/24 1125  GLUCAP 196* 184* 175* 161* 166*   Lipid Profile: No results for input(s): CHOL, HDL, LDLCALC, TRIG, CHOLHDL, LDLDIRECT in the last 72 hours. Thyroid  Function Tests: No results for input(s): TSH, T4TOTAL, FREET4, T3FREE, THYROIDAB in the last 72 hours. Anemia Panel: No results for input(s): VITAMINB12, FOLATE, FERRITIN, TIBC, IRON, RETICCTPCT in the last 72 hours. Sepsis Labs: No results for input(s): PROCALCITON, LATICACIDVEN in the last 168 hours.  No results found for this or any previous visit (from the past 240 hours).       Radiology Studies: DG Shoulder Left Port Result Date: 03/16/2024 CLINICAL DATA:  Dislocation EXAM: LEFT SHOULDER COMPARISON:  Plain films and CT 03/11/2024 FINDINGS: No acute bony abnormality. Specifically, no fracture, subluxation, or dislocation. Degenerative changes in the  left AC and glenohumeral joints. Postoperative changes in the left axilla with skin staples and surgical drain in place. IMPRESSION: No acute bony abnormality. Postoperative changes in the left axilla. Electronically Signed   By: Franky Crease M.D.   On: 03/16/2024 11:30        Scheduled Meds:  amLODipine   10 mg Oral Daily   aspirin  EC  81 mg Oral Daily   atorvastatin   40 mg Oral Daily   citalopram   20 mg Oral Daily   clopidogrel   75 mg Oral Daily   cyclobenzaprine   7.5 mg Oral TID   ezetimibe   10 mg Oral Daily   folic acid   1 mg Oral Daily   heparin   5,000 Units Subcutaneous Q8H   insulin  aspart  0-9 Units Subcutaneous TID WC   multivitamin with minerals  1 tablet Oral Daily   nicotine   21 mg Transdermal Daily   thiamine   100 mg Oral Daily   Or   thiamine   100 mg Intravenous Daily   Continuous Infusions:  LOS: 5 days    Time spent: 52 minutes    Renato Applebaum, MD [facility]

## 2024-03-17 NOTE — Plan of Care (Signed)
  Problem: Education: Goal: Knowledge of General Education information will improve Description: Including pain rating scale, medication(s)/side effects and non-pharmacologic comfort measures Outcome: Progressing   Problem: Health Behavior/Discharge Planning: Goal: Ability to manage health-related needs will improve Outcome: Progressing   Problem: Clinical Measurements: Goal: Ability to maintain clinical measurements within normal limits will improve Outcome: Progressing Goal: Will remain free from infection Outcome: Progressing Goal: Diagnostic test results will improve Outcome: Progressing Goal: Respiratory complications will improve Outcome: Progressing Goal: Cardiovascular complication will be avoided Outcome: Progressing   Problem: Activity: Goal: Risk for activity intolerance will decrease Outcome: Progressing   Problem: Nutrition: Goal: Adequate nutrition will be maintained Outcome: Progressing   Problem: Coping: Goal: Level of anxiety will decrease Outcome: Progressing   Problem: Elimination: Goal: Will not experience complications related to bowel motility Outcome: Progressing Goal: Will not experience complications related to urinary retention Outcome: Progressing   Problem: Pain Managment: Goal: General experience of comfort will improve and/or be controlled Outcome: Progressing   Problem: Safety: Goal: Ability to remain free from injury will improve Outcome: Progressing   Problem: Skin Integrity: Goal: Risk for impaired skin integrity will decrease Outcome: Progressing   Problem: Education: Goal: Ability to describe self-care measures that may prevent or decrease complications (Diabetes Survival Skills Education) will improve Outcome: Progressing Goal: Individualized Educational Video(s) Outcome: Progressing   Problem: Coping: Goal: Ability to adjust to condition or change in health will improve Outcome: Progressing   Problem: Fluid  Volume: Goal: Ability to maintain a balanced intake and output will improve Outcome: Progressing   Problem: Metabolic: Goal: Ability to maintain appropriate glucose levels will improve Outcome: Progressing   Problem: Nutritional: Goal: Maintenance of adequate nutrition will improve Outcome: Progressing Goal: Progress toward achieving an optimal weight will improve Outcome: Progressing   Problem: Skin Integrity: Goal: Risk for impaired skin integrity will decrease Outcome: Progressing   Problem: Tissue Perfusion: Goal: Adequacy of tissue perfusion will improve Outcome: Progressing

## 2024-03-17 NOTE — TOC Initial Note (Signed)
 Transition of Care [facility]) - Initial/Assessment Note    Patient Details  Name: Randy Hebert MRN: [id number] Date of Birth: August 31, 1966  Transition of [facility]) CM/SW Contact:    Rosalva Jon Bloch, RN Phone Number: 03/17/2024, 3:26 PM  Clinical Narrative:                    - s/p open exposure and repair of left axillary pseudoaneurysm and evacuation of left axillary hematoma, 7/12  From home alone. Supportive daughter. PTA independent with ADL's, no DMEusage. NCM confirmed PCP: Jacqulyn Ahle MD. Pt without transportation issues or RX med concerns. Pt states once d/c will probably transition to daughter's home for 1-2 days to recover.  TOC team following and will assist with needs....   Expected Discharge Plan: Home/Self Care Barriers to Discharge: Continued Medical Work up   Patient Goals and CMS Choice            Expected Discharge Plan and Services       Living arrangements for the past 2 months: Apartment                                      Prior Living Arrangements/Services Living arrangements for the past 2 months: Apartment Lives with:: Self Patient language and need for interpreter reviewed:: Yes Do you feel safe going back to the place where you live?: Yes      Need for Family Participation in Patient Care: Yes (Comment) Care giver support system in place?: Yes (comment)   Criminal Activity/Legal Involvement Pertinent to Current Situation/Hospitalization: No - Comment as needed  Activities of Daily Living   ADL Screening (condition at time of admission) Independently performs ADLs?: Yes (appropriate for developmental age) Is the patient deaf or have difficulty hearing?: No Does the patient have difficulty seeing, even when wearing glasses/contacts?: No Does the patient have difficulty concentrating, remembering, or making decisions?: No  Permission Sought/Granted                  Emotional Assessment       Orientation: : Oriented  to Self, Oriented to Place, Oriented to  Time, Oriented to Situation Alcohol  / Substance Use: Not Applicable Psych Involvement: No (comment)  Admission diagnosis:  Fall in home, initial encounter [W19.XXXA, Y92.009] Vascular injury of arm, left, initial encounter [S45.902A] Shoulder dislocation, left, initial encounter [S43.005A] Axillary artery injury [S45.009A] Axillary artery injury, left, initial encounter [S45.002A] Patient Active Problem List   Diagnosis Date Noted   Acute blood loss anemia 03/13/2024   Axillary artery injury 03/12/2024   Axillary artery injury, left, initial encounter 03/12/2024   Hypokalemia 03/03/2024   Acute CVA (cerebrovascular accident) (HCC) 03/02/2024   Essential hypertension 03/02/2024   Dyslipidemia 03/02/2024   Type 2 diabetes mellitus without complications (HCC) 03/02/2024   Depression 03/02/2024   Acute left-sided weakness 03/01/2024   Acute right-sided low back pain with right-sided sciatica 11/30/2023   Anxiety 04/23/2023   Erectile dysfunction 02/24/2023   Chronic eczematous otitis externa of both ears 02/05/2023   Tobacco abuse 12/25/2022   Malignant neoplasm of upper lobe of right lung (HCC) 09/25/2022   Cervical spondylosis with myelopathy 08/01/2022   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   Vocal cord dysfunction 05/21/2022   DDD (degenerative disc disease), cervical 05/21/2022   Osteoarthritis of both hands 05/19/2022   History of GI bleed 11/13/2021   Insomnia 09/13/2021   Cirrhosis of  liver (HCC) 11/08/2018   Barrett's esophagus 08/04/2018   Vitamin D  deficiency 06/30/2018   Alcohol  abuse 06/30/2018   DM type 2 causing vascular disease (HCC) 08/17/2017   Chronic combined systolic and diastolic CHF (congestive heart failure) (HCC) 05/15/2017   OSA (obstructive sleep apnea) 04/28/2017   Depression with anxiety 04/15/2017   GERD (gastroesophageal reflux disease) 09/23/2016   Asthmatic bronchitis , chronic (HCC) 08/27/2015   Mixed  hyperlipidemia 08/27/2015   Class 1 obesity due to excess calories with serious comorbidity and body mass index (BMI) of 32.0 to 32.9 in adult 08/27/2015   CAD (coronary artery disease) 05/31/2015   Essential hypertension, benign 02/09/2015   PCP:  Bluford Jacqulyn MATSU, DO Pharmacy:   [facility] - [place], [place] - [address] [place] [address] Phone: [phone] Fax: [phone]     Social Drivers of Health (SDOH) Social History: SDOH Screenings   Food Insecurity: No Food Insecurity (03/12/2024)  Housing: Low Risk  (03/12/2024)  Transportation Needs: No Transportation Needs (03/12/2024)  Utilities: Not At Risk (03/12/2024)  Alcohol  Screen: Medium Risk (01/08/2024)  Depression (PHQ2-9): Low Risk  (03/01/2024)  Financial Resource Strain: Low Risk  (01/08/2024)  Physical Activity: Insufficiently Active (01/08/2024)  Social Connections: Moderately Integrated (03/02/2024)  Stress: No Stress Concern Present (01/08/2024)  Tobacco Use: High Risk (03/12/2024)  Health Literacy: Adequate Health Literacy (01/08/2024)   SDOH Interventions:     Readmission Risk Interventions     No data to display

## 2024-03-17 NOTE — Progress Notes (Signed)
    Subjective  - POD #5  Feels his left arm is improving   Physical Exam:  Palpable left radial pulse       Assessment/Plan:  POD #5  -JP drainage was minimal.  Will need to verify this and if less than 30 cc in past 24 hours can be removed - Continue with therapy to his left arm given shoulder dislocation  Wells Launa Goedken 03/17/2024 8:14 AM --  Vitals:   03/17/24 0444 03/17/24 0813  BP: (P) 134/71 112/68  Pulse: (P) 94 97  Resp: (P) 18 17  Temp: (P) 98.6 F (37 C) 98.2 F (36.8 C)  SpO2: (P) 94% 91%    Intake/Output Summary (Last 24 hours) at 03/17/2024 0814 Last data filed at 03/16/2024 2100 Gross per 24 hour  Intake 180 ml  Output 20 ml  Net 160 ml     Laboratory CBC    Component Value Date/Time   WBC 12.7 (H) 03/17/2024 0607   HGB 11.1 (L) 03/17/2024 0607   HGB 17.1 10/28/2023 0809   HCT 32.7 (L) 03/17/2024 0607   HCT 50.7 10/28/2023 0809   PLT 252 03/17/2024 0607   PLT 175 10/28/2023 0809    BMET    Component Value Date/Time   NA 132 (L) 03/15/2024 0614   NA 138 10/28/2023 0809   K 3.7 03/15/2024 0614   CL 97 (L) 03/15/2024 0614   CO2 27 03/15/2024 0614   GLUCOSE 157 (H) 03/15/2024 0614   BUN 11 03/15/2024 0614   BUN 15 10/28/2023 0809   CREATININE 0.66 03/15/2024 0614   CREATININE 0.68 (L) 09/29/2022 0836   CALCIUM  9.0 03/15/2024 0614   GFRNONAA >60 03/15/2024 0614   GFRNONAA 108 06/21/2020 0707   GFRAA 127 10/24/2020 0811   GFRAA 125 06/21/2020 0707    COAG Lab Results  Component Value Date   INR 1.0 03/02/2024   INR 1.1 10/28/2023   INR 1.1 05/05/2023   No results found for: PTT  Antibiotics Anti-infectives (From admission, onward)    Start     Dose/Rate Route Frequency Ordered Stop   03/12/24 1115  ceFAZolin  (ANCEF ) IVPB 2g/100 mL premix        2 g 200 mL/hr over 30 Minutes Intravenous Every 8 hours 03/12/24 1019 03/12/24 1855        V. Malvina Serene CLORE, M.D., [facility]  [place] Office: [phone] Pager:  [phone]

## 2024-03-18 ENCOUNTER — Encounter (HOSPITAL_COMMUNITY): Admission: RE | Payer: Self-pay | Source: Home / Self Care

## 2024-03-18 ENCOUNTER — Ambulatory Visit (HOSPITAL_COMMUNITY): Admission: RE | Admit: 2024-03-18 | Source: Home / Self Care | Admitting: Ophthalmology

## 2024-03-18 DIAGNOSIS — D62 Acute posthemorrhagic anemia: Secondary | ICD-10-CM | POA: Diagnosis not present

## 2024-03-18 DIAGNOSIS — S45002A Unspecified injury of axillary artery, left side, initial encounter: Secondary | ICD-10-CM | POA: Diagnosis not present

## 2024-03-18 LAB — GLUCOSE, CAPILLARY
Glucose-Capillary: 147 mg/dL — ABNORMAL HIGH (ref 70–99)
Glucose-Capillary: 167 mg/dL — ABNORMAL HIGH (ref 70–99)
Glucose-Capillary: 227 mg/dL — ABNORMAL HIGH (ref 70–99)
Glucose-Capillary: 112 mg/dL — ABNORMAL HIGH (ref 70–99)

## 2024-03-18 SURGERY — PHACOEMULSIFICATION, CATARACT, WITH IOL INSERTION
Anesthesia: Monitor Anesthesia Care | Laterality: Left

## 2024-03-18 MED FILL — Fentanyl Citrate Preservative Free (PF) Inj 100 MCG/2ML: INTRAMUSCULAR | Qty: 2 | Status: AC

## 2024-03-18 NOTE — Progress Notes (Signed)
   03/18/24 2044  BiPAP/CPAP/SIPAP  Reason BIPAP/CPAP not in use Non-compliant  BiPAP/CPAP /SiPAP Vitals  Pulse Rate 90  Resp 16  SpO2 96 %  Bilateral Breath Sounds Clear;Diminished  MEWS Score/Color  MEWS Score 0  MEWS Score Color Landy

## 2024-03-18 NOTE — Progress Notes (Signed)
  Progress Note    03/18/2024 7:46 AM 7 Days Post-Op  Subjective:  pt states his hand feels a little bit better    Vitals:   03/17/24 2016 03/18/24 0510  BP: 123/67 120/74  Pulse: 84 89  Resp: 17 17  Temp: 98.1 F (36.7 C) 98.2 F (36.8 C)  SpO2: 90% 96%    Physical Exam: General:  no distress Cardiac:  regular Lungs:  non labored Extremities:  palpable left radial pulse   CBC    Component Value Date/Time   WBC 12.7 (H) 03/17/2024 0607   RBC 3.58 (L) 03/17/2024 0607   HGB 11.1 (L) 03/17/2024 0607   HGB 17.1 10/28/2023 0809   HCT 32.7 (L) 03/17/2024 0607   HCT 50.7 10/28/2023 0809   PLT 252 03/17/2024 0607   PLT 175 10/28/2023 0809   MCV 91.3 03/17/2024 0607   MCV 91 10/28/2023 0809   MCH 31.0 03/17/2024 0607   MCHC 33.9 03/17/2024 0607   RDW 14.0 03/17/2024 0607   RDW 12.8 10/28/2023 0809   LYMPHSABS 2.2 03/17/2024 0607   LYMPHSABS 1.6 10/28/2023 0809   MONOABS 1.2 (H) 03/17/2024 0607   EOSABS 0.2 03/17/2024 0607   EOSABS 0.2 10/28/2023 0809   BASOSABS 0.1 03/17/2024 0607   BASOSABS 0.0 10/28/2023 0809    BMET    Component Value Date/Time   NA 132 (L) 03/15/2024 0614   NA 138 10/28/2023 0809   K 3.7 03/15/2024 0614   CL 97 (L) 03/15/2024 0614   CO2 27 03/15/2024 0614   GLUCOSE 157 (H) 03/15/2024 0614   BUN 11 03/15/2024 0614   BUN 15 10/28/2023 0809   CREATININE 0.66 03/15/2024 0614   CREATININE 0.68 (L) 09/29/2022 0836   CALCIUM  9.0 03/15/2024 0614   GFRNONAA >60 03/15/2024 0614   GFRNONAA 108 06/21/2020 0707   GFRAA 127 10/24/2020 0811   GFRAA 125 06/21/2020 0707    INR    Component Value Date/Time   INR 1.0 03/02/2024 1920     Intake/Output Summary (Last 24 hours) at 03/18/2024 0746 Last data filed at 03/17/2024 1700 Gross per 24 hour  Intake 720 ml  Output --  Net 720 ml      Assessment/Plan:  58 y.o. male is s/p:  Open exposure and repair of left axillary psa 03/12/2024 by Dr. Serene   7 Days Post-Op   -pt with  palpable left radial pulse.   -35cc bloody drainage in JP bulb this am.  Unfortunately, it is unknown the last time the JP was emptied.  Probably needs another day of recording output before removing drain -repeat xray was stable -pt on asa/plavix /statin for recent right thalamic stroke   Lucie Apt, [place] [facility] [phone] 03/18/2024 7:46 AM

## 2024-03-18 NOTE — Plan of Care (Signed)
  Problem: Clinical Measurements: Goal: Cardiovascular complication will be avoided Outcome: Progressing   Problem: Activity: Goal: Risk for activity intolerance will decrease Outcome: Progressing   Problem: Elimination: Goal: Will not experience complications related to urinary retention Outcome: Progressing   Problem: Pain Managment: Goal: General experience of comfort will improve and/or be controlled Outcome: Progressing

## 2024-03-18 NOTE — Progress Notes (Signed)
 Mobility Specialist Progress Note:    03/18/24 1120  Mobility  Activity Ambulated with assistance in hallway  Level of Assistance Modified independent, requires aide device or extra time  Assistive Device None  Distance Ambulated (ft) 225 ft  LUE Weight Bearing Per Provider Order NWB  Activity Response Tolerated well  Mobility Referral Yes  Mobility visit 1 Mobility  Mobility Specialist Start Time (ACUTE ONLY) 1007  Mobility Specialist Stop Time (ACUTE ONLY) 1017  Mobility Specialist Time Calculation (min) (ACUTE ONLY) 10 min   Received pt in room and agreeable for mobility. No c/o throughout. Returned pt to room and left in chair. Personal belongings and call light within reach. All needs met.  Lavanda Pollack Mobility Specialist  Please contact via [employer] or  Rehab Office [phone]

## 2024-03-18 NOTE — Progress Notes (Signed)
 PROGRESS NOTE    Randy Hebert  FMW:[id number] DOB: [birth year] DOA: 03/11/2024 PCP: Bluford Jacqulyn MATSU, DO    Brief Narrative:  58 y.o. male with history of diabetes mellitus type 2, hypertension, CAD, liver cirrhosis, anxiety, depression, OSA on CPAP, recent admission for stroke with left-sided weakness, discharged on 03/03/2024, who was brought to the ER after patient had a fall after tripping on a screw and landed on his left shoulder.  He was found to have left shoulder dislocation which was reduced in the emergency department.  He was also found to have left axillary hematoma with active extravasation on CT scan of left upper extremity.  Patient underwent repair of left axillary pseudoaneurysm on 7/12 by vascular surgery.  Subjective:  Patient seen and examined.  More motivated to work.  He still has JP drain.  Pain is controlled today.   Assessment & Plan:   Traumatic left axillary hematoma: Status post left axillary pseudoaneurysm repair 7/12 by vascular surgery. JP drain in place.  Followed by surgery.  Postop management as per surgery.  Elevate affected part. Adequate pain relief with IV and oral opiates.  Mobilize with PT OT. Adding Flexeril .  Increase dose to 7.5 mg 3 times daily.  Acute blood loss anemia: Hemoglobin 18-13-stabilized around 11.  Continue close monitoring.  Left shoulder dislocation: Reduced in the emergency room.  Stable. CT scan after reduction of the shoulder is stable.  Start gradual mobility.  Patient is hesitant to mobilize.  Repeat x-ray stable.  Recent right thalamic stroke with left paresis: Continued on aspirin  Plavix  and statin.  Work with PT OT.  Type 2 diabetes: On sliding scale.  Liver cirrhosis, combined heart failure: Well compensated at this time.   DVT prophylaxis: heparin  injection 5,000 Units Start: 03/12/24 1400 SCD's Start: 03/12/24 1020 SCDs Start: 03/12/24 0355   Code Status: Full code Family Communication: None  today. Disposition Plan: Status is: Inpatient Remains inpatient appropriate because: Postop management as per surgery.  Patient is medically stable to discharge when he is surgically stable.     Consultants:  Vascular surgery  Procedures:  Aneurysm repair Shoulder relocation  Antimicrobials:  None     Objective: Vitals:   03/17/24 1400 03/17/24 2016 03/18/24 0510 03/18/24 0749  BP: 121/67 123/67 120/74 125/71  Pulse: 91 84 89 98  Resp: 17 17 17 17   Temp: 98.7 F (37.1 C) 98.1 F (36.7 C) 98.2 F (36.8 C) 98.1 F (36.7 C)  TempSrc: Oral  Oral   SpO2: 93% 90% 96% 99%  Weight:      Height:        Intake/Output Summary (Last 24 hours) at 03/18/2024 1318 Last data filed at 03/18/2024 0900 Gross per 24 hour  Intake 720 ml  Output 35 ml  Net 685 ml   Filed Weights   03/11/24 1455  Weight: 87.5 kg    Examination:  General: Comfortably sitting in couch. Cardiovascular: S1-S2 normal.  Regular rate rhythm. Respiratory: Bilateral clear.  Extensive ecchymosis left axilla, anterior chest wall and midsternal chest wall.  JP drain with trace bloody drainage. Gastrointestinal: Soft.  Nontender.  Bowel sound present. Ext: Swollen and ecchymotic left arm.  Distal vascular status intact. Left elbow and shoulder movements are intact.     Data Reviewed: I have personally reviewed following labs and imaging studies  CBC: Recent Labs  Lab 03/11/24 1613 03/12/24 0643 03/13/24 0828 03/13/24 0832 03/14/24 0632 03/14/24 1842 03/15/24 0614 03/17/24 0607  WBC 24.5* 16.2* 16.7* 16.6* 12.2*  --  14.3* 12.7*  NEUTROABS 21.0* 14.5*  --   --   --   --   --  8.8*  HGB 18.0* 12.7* 11.9* 11.9* 11.2* 11.0* 11.1* 11.1*  HCT 51.5 37.7* 35.1* 35.5* 33.3* 32.7* 33.2* 32.7*  MCV 91.2 91.3 90.9 91.3 91.7  --  90.7 91.3  PLT 274 165 167 171 151  --  187 252   Basic Metabolic Panel: Recent Labs  Lab 03/11/24 1613 03/12/24 0643 03/13/24 0828 03/14/24 0632 03/15/24 0614  NA 136  134* 133* 134* 132*  K 3.6 4.3 4.0 3.8 3.7  CL 99 100 101 101 97*  CO2 20* 25 22 24 27   GLUCOSE 223* 192* 146* 152* 157*  BUN 12 16 12 9 11   CREATININE 0.85 0.82 0.62 0.60* 0.66  CALCIUM  9.3 8.8* 8.8* 8.6* 9.0   GFR: Estimated Creatinine Clearance: 101.1 mL/min (by C-G formula based on SCr of 0.66 mg/dL). Liver Function Tests: Recent Labs  Lab 03/11/24 1613  AST 36  ALT 46*  ALKPHOS 101  BILITOT 1.4*  PROT 7.8  ALBUMIN  4.0   No results for input(s): LIPASE, AMYLASE in the last 168 hours. No results for input(s): AMMONIA in the last 168 hours. Coagulation Profile: No results for input(s): INR, PROTIME in the last 168 hours. Cardiac Enzymes: No results for input(s): CKTOTAL, CKMB, CKMBINDEX, TROPONINI in the last 168 hours. BNP (last 3 results) No results for input(s): PROBNP in the last 8760 hours. HbA1C: No results for input(s): HGBA1C in the last 72 hours. CBG: Recent Labs  Lab 03/17/24 1125 03/17/24 1633 03/17/24 2151 03/18/24 0619 03/18/24 1142  GLUCAP 166* 140* 228* 147* 167*   Lipid Profile: No results for input(s): CHOL, HDL, LDLCALC, TRIG, CHOLHDL, LDLDIRECT in the last 72 hours. Thyroid  Function Tests: No results for input(s): TSH, T4TOTAL, FREET4, T3FREE, THYROIDAB in the last 72 hours. Anemia Panel: No results for input(s): VITAMINB12, FOLATE, FERRITIN, TIBC, IRON, RETICCTPCT in the last 72 hours. Sepsis Labs: No results for input(s): PROCALCITON, LATICACIDVEN in the last 168 hours.  No results found for this or any previous visit (from the past 240 hours).       Radiology Studies: No results found.       Scheduled Meds:  amLODipine   10 mg Oral Daily   aspirin  EC  81 mg Oral Daily   atorvastatin   40 mg Oral Daily   citalopram   20 mg Oral Daily   clopidogrel   75 mg Oral Daily   cyclobenzaprine   7.5 mg Oral TID   ezetimibe   10 mg Oral Daily   folic acid   1 mg Oral Daily    heparin   5,000 Units Subcutaneous Q8H   insulin  aspart  0-9 Units Subcutaneous TID WC   multivitamin with minerals  1 tablet Oral Daily   nicotine   21 mg Transdermal Daily   thiamine   100 mg Oral Daily   Or   thiamine   100 mg Intravenous Daily   Continuous Infusions:   LOS: 6 days    Time spent: 40 minutes    Renato Applebaum, MD [facility]

## 2024-03-19 DIAGNOSIS — D62 Acute posthemorrhagic anemia: Secondary | ICD-10-CM | POA: Diagnosis not present

## 2024-03-19 DIAGNOSIS — S45002A Unspecified injury of axillary artery, left side, initial encounter: Secondary | ICD-10-CM | POA: Diagnosis not present

## 2024-03-19 LAB — GLUCOSE, CAPILLARY
Glucose-Capillary: 154 mg/dL — ABNORMAL HIGH (ref 70–99)
Glucose-Capillary: 219 mg/dL — ABNORMAL HIGH (ref 70–99)
Glucose-Capillary: 206 mg/dL — ABNORMAL HIGH (ref 70–99)

## 2024-03-19 MED ORDER — OXYCODONE-ACETAMINOPHEN 5-325 MG PO TABS: 1.0000 | ORAL_TABLET | ORAL | 0 refills | Status: DC | PRN

## 2024-03-19 NOTE — Progress Notes (Signed)
  Progress Note    03/19/2024 7:54 AM 8 Days Post-Op  Subjective: Feels good this morning, wants to go home    Vitals:   03/19/24 0500 03/19/24 0729  BP: 119/68 113/77  Pulse: 85 87  Resp: 18 16  Temp: 98.4 F (36.9 C) 98.4 F (36.9 C)  SpO2: 95% 92%    Physical Exam: General:  no distress Cardiac:  regular Lungs:  non labored Extremities:  palpable left radial pulse Significant edema in the left upper extremity.  States that he is having difficulty keeping the arm elevated   CBC    Component Value Date/Time   WBC 12.7 (H) 03/17/2024 0607   RBC 3.58 (L) 03/17/2024 0607   HGB 11.1 (L) 03/17/2024 0607   HGB 17.1 10/28/2023 0809   HCT 32.7 (L) 03/17/2024 0607   HCT 50.7 10/28/2023 0809   PLT 252 03/17/2024 0607   PLT 175 10/28/2023 0809   MCV 91.3 03/17/2024 0607   MCV 91 10/28/2023 0809   MCH 31.0 03/17/2024 0607   MCHC 33.9 03/17/2024 0607   RDW 14.0 03/17/2024 0607   RDW 12.8 10/28/2023 0809   LYMPHSABS 2.2 03/17/2024 0607   LYMPHSABS 1.6 10/28/2023 0809   MONOABS 1.2 (H) 03/17/2024 0607   EOSABS 0.2 03/17/2024 0607   EOSABS 0.2 10/28/2023 0809   BASOSABS 0.1 03/17/2024 0607   BASOSABS 0.0 10/28/2023 0809    BMET    Component Value Date/Time   NA 132 (L) 03/15/2024 0614   NA 138 10/28/2023 0809   K 3.7 03/15/2024 0614   CL 97 (L) 03/15/2024 0614   CO2 27 03/15/2024 0614   GLUCOSE 157 (H) 03/15/2024 0614   BUN 11 03/15/2024 0614   BUN 15 10/28/2023 0809   CREATININE 0.66 03/15/2024 0614   CREATININE 0.68 (L) 09/29/2022 0836   CALCIUM  9.0 03/15/2024 0614   GFRNONAA >60 03/15/2024 0614   GFRNONAA 108 06/21/2020 0707   GFRAA 127 10/24/2020 0811   GFRAA 125 06/21/2020 0707    INR    Component Value Date/Time   INR 1.0 03/02/2024 1920     Intake/Output Summary (Last 24 hours) at 03/19/2024 0754 Last data filed at 03/19/2024 0640 Gross per 24 hour  Intake 720 ml  Output 65 ml  Net 655 ml      Assessment/Plan:  58 y.o. male is s/p:   Open exposure and repair of left axillary psa 03/12/2024 by Dr. Serene   8 Days Post-Op   -pt with palpable left radial pulse.   -repeat xray was stable -pt on asa/plavix /statin for recent right thalamic stroke   Okay for home today from vascular surgery standpoint.  Fonda FORBES Rim MD [facility] [phone] 03/19/2024 7:54 AM

## 2024-03-19 NOTE — Discharge Summary (Signed)
 Physician Discharge Summary  BERNON ARVISO FMW:[id number] DOB: 1966/04/21 DOA: 03/11/2024  PCP: Cook, Jayce G, DO  Admit date: 03/11/2024 Discharge date: 03/19/2024  Admitted From: Home Disposition: Home with outpatient therapy.  Recommendations for Outpatient Follow-up:  Follow up with PCP in 1-2 weeks Please obtain BMP/CBC in one week Vascular surgery to schedule follow-up  Home Health: N/A Equipment/Devices: N/A  Discharge Condition: Stable CODE STATUS: Full code Diet recommendation: Low-salt and low-carb diet  Discharge summary: 58 y.o. male with history of diabetes mellitus type 2, hypertension, CAD, liver cirrhosis, anxiety, depression, OSA on CPAP, recent admission for stroke with left-sided weakness, discharged on 03/03/2024, who was brought to the ER after patient had a fall after tripping on a screw and landed on his left shoulder.  He was found to have left shoulder dislocation which was reduced in the emergency department.  He was also found to have left axillary hematoma with active extravasation on CT scan of left upper extremity.  Patient underwent repair of left axillary pseudoaneurysm on 7/12 by vascular surgery.  Remained in the hospital due to ongoing surgical management.  Going home today. Meantime he had declined to work with therapies on his left arm.  Now he is agreeable to work with them as outpatient.  Treated for following conditions.  # Traumatic left axillary hematoma: Status post left axillary pseudoaneurysm repair 7/12 by vascular surgery.  Remained in the hospital with ongoing drainage. Ecchymosis and swelling is improving appropriately now. Drain is out.  Hemoglobin stabilized. Elevated left arm, compression bandages from fingers to arm. Adequate pain medication. Will prescribe a short course of pain medications with opiates, Flexeril .  He will also continue to Tylenol  and NSAIDs. Vascular surgery to schedule follow-up.   Acute blood loss anemia:  Hemoglobin 18-13-stabilized around 11.  Recheck in 1 week.   Left shoulder dislocation: Reduced in the emergency room.  Stable. CT scan after reduction of the shoulder is stable.  Repeat chest x-ray stable. Patient was hesitant to mobilize and did not agree with working with therapist while in the hospital. Now his pain has improved, he is agreeable to do outpatient rehab.  Will send referral.   Recent right thalamic stroke with left paresis: Continued on aspirin  Plavix  and statin.  No neurodeficits.   Type 2 diabetes: On sliding scale.  Resume home medications.   Liver cirrhosis, combined heart failure: Well compensated at this time.  Resume all home medications.  Medically stable for discharge.   Discharge Diagnoses:  Principal Problem:   Axillary artery injury Active Problems:   Essential hypertension, benign   CAD (coronary artery disease)   Mixed hyperlipidemia   OSA (obstructive sleep apnea)   DM type 2 causing vascular disease (HCC)   Alcohol  abuse   Cirrhosis of liver (HCC)   Tobacco abuse   Axillary artery injury, left, initial encounter   Acute blood loss anemia    Discharge Instructions  Discharge Instructions     Diet - low sodium heart healthy   Complete by: As directed    Diet Carb Modified   Complete by: As directed    Discharge wound care:   Complete by: As directed    Keep wounds dry and clean , keep compression bandages and elevate left arm   Increase activity slowly   Complete by: As directed       Allergies as of 03/19/2024       Reactions   Coreg  [carvedilol ] Other (See Comments)   Sinus pause on  telemetry >3 seconds. Longest one 9 sec. No AV nodal agent   Zestril  [lisinopril ] Anaphylaxis, Shortness Of Breath, Swelling   Angioedema, required intubation and mechanical ventilation   Chantix  [varenicline ] Other (See Comments)   Nightmares Insomnia    Amoxil  [amoxicillin ] Nausea Only        Medication List     STOP taking these  medications    Ilevro 0.3 % ophthalmic suspension Generic drug: nepafenac   prednisoLONE acetate 1 % ophthalmic suspension Commonly known as: PRED FORTE       TAKE these medications    acetaminophen  500 MG tablet Commonly known as: TYLENOL  Take 2 tablets (1,000 mg total) by mouth every 8 (eight) hours as needed for mild pain or fever.   amLODipine  10 MG tablet Commonly known as: NORVASC  Take 1 tablet (10 mg total) by mouth daily.   aspirin  EC 81 MG tablet Take 1 tablet (81 mg total) by mouth daily. Swallow whole.   atorvastatin  40 MG tablet Commonly known as: LIPITOR  Take 1 tablet (40 mg total) by mouth daily. What changed: when to take this   citalopram  20 MG tablet Commonly known as: CELEXA  Take 1 tablet (20 mg total) by mouth daily.   clopidogrel  75 MG tablet Commonly known as: PLAVIX  Take 1 tablet (75 mg total) by mouth daily for 21 days.   dapagliflozin  propanediol 10 MG Tabs tablet Commonly known as: Farxiga  Take 1 tablet (10 mg total) by mouth daily.   ezetimibe  10 MG tablet Commonly known as: Zetia  Take 1 tablet (10 mg total) by mouth daily.   furosemide  40 MG tablet Commonly known as: LASIX  Take 40 mg by mouth daily as needed (excessive swelling).   glipiZIDE  5 MG 24 hr tablet Commonly known as: GLUCOTROL  XL Take 5 mg by mouth daily.   IRON PO Take 1 tablet by mouth daily.   MAGNESIUM  PO Take 1 tablet by mouth 2 (two) times daily.   metFORMIN  500 MG tablet Commonly known as: GLUCOPHAGE  Take 1 tablet (500 mg total) by mouth 2 (two) times daily with a meal.   moxifloxacin 0.5 % ophthalmic solution Commonly known as: VIGAMOX Place 1 drop into the left eye 3 (three) times daily. 7 day course   naproxen  375 MG tablet Commonly known as: NAPROSYN  Take 375 mg by mouth 2 (two) times daily as needed for moderate pain (pain score 4-6).   nitroGLYCERIN  0.4 MG SL tablet Commonly known as: NITROSTAT  Place 1 tablet (0.4 mg total) under the tongue  every 5 (five) minutes x 3 doses as needed (if no relief after 3rd dose, proceed to ED or call 911).   oxyCODONE -acetaminophen  5-325 MG tablet Commonly known as: PERCOCET/ROXICET Take 1 tablet by mouth every 4 (four) hours as needed for moderate pain (pain score 4-6).   pantoprazole  40 MG tablet Commonly known as: PROTONIX  TAKE (1) TABLET BY MOUTH TWICE DAILY BEFORE A MEAL.   Restasis  0.05 % ophthalmic emulsion Generic drug: cycloSPORINE  Place 1 drop into both eyes 2 (two) times daily.   Symbicort  160-4.5 MCG/ACT inhaler Generic drug: budesonide -formoterol  INHALE 2 PUFFS INTO THE LUNGS FIRST THING IN THE MORNING AND 2 PUFFS ABOUT 12 HOURS LATER.   Trulicity  1.5 MG/0.5ML Soaj Generic drug: Dulaglutide  Inject 1.5 mg into the skin once a week. What changed: when to take this   VITAMIN C  PO Take 1 tablet by mouth daily.   VITAMIN D -3 PO Take 1 capsule by mouth daily.  Discharge Care Instructions  (From admission, onward)           Start     Ordered   03/19/24 0000  Discharge wound care:       Comments: Keep wounds dry and clean , keep compression bandages and elevate left arm   03/19/24 1056            Follow-up Information     Cook, Jayce G, DO Follow up.   Specialty: Family Medicine Contact information: [address] [place] [address] [phone]         [facility] Follow up in 1 week(s).   Specialty: Vascular Surgery Why: The office will call you with your appointment Contact information: [address], [address] [place]  [address] [phone]               Allergies  Allergen Reactions   Coreg  [Carvedilol ] Other (See Comments)    Sinus pause on telemetry >3 seconds. Longest one 9 sec. No AV nodal agent   Zestril  [Lisinopril ] Anaphylaxis, Shortness Of Breath and Swelling    Angioedema, required intubation and mechanical ventilation    Chantix  [Varenicline ] Other (See Comments)    Nightmares Insomnia    Amoxil  [Amoxicillin ] Nausea Only    Consultations: Vascular surgery   Procedures/Studies: DG Shoulder Left Port Result Date: 03/16/2024 CLINICAL DATA:  Dislocation EXAM: LEFT SHOULDER COMPARISON:  Plain films and CT 03/11/2024 FINDINGS: No acute bony abnormality. Specifically, no fracture, subluxation, or dislocation. Degenerative changes in the left AC and glenohumeral joints. Postoperative changes in the left axilla with skin staples and surgical drain in place. IMPRESSION: No acute bony abnormality. Postoperative changes in the left axilla. Electronically Signed   By: Franky Crease M.D.   On: 03/16/2024 11:30   CT ANGIO UP EXTREM LEFT W &/OR WO CONTAST Result Date: 03/11/2024 CLINICAL DATA:  Recent shoulder dislocation with surrounding muscular hematoma, initial encounter EXAM: CT ANGIOGRAPHY OF THE LEFT UPPEREXTREMITY TECHNIQUE: Multidetector CT imaging of the left upper extremitywas performed using the standard protocol during bolus administration of intravenous contrast. Multiplanar CT image reconstructions and MIPs were obtained to evaluate the vascular anatomy. RADIATION DOSE REDUCTION: This exam was performed according to the departmental dose-optimization program which includes automated exposure control, adjustment of the mA and/or kV according to patient size and/or use of iterative reconstruction technique. CONTRAST:  75mL OMNIPAQUE  IOHEXOL  350 MG/ML SOLN COMPARISON:  CT of the shoulder from earlier in the same day. FINDINGS: Vascular: Right aortic arch is noted. The subclavian artery is patent throughout its course. The axillary artery is somewhat attenuated secondary to extrinsic compression by the muscular hematoma seen previously. The more distal brachial radial and ulnar arteries are patent. The previously described intramuscular hematoma is again seen within the subscapularis. The soft tissue hematoma in the left  axilla is again identified as well with central extravasation identified. Given the lack of venous opacification on the left, this likely represents a small arterial hemorrhage. The overall hematoma measures approximately 6.7 x 5.3 cm in greatest transverse and AP dimensions not significantly changed from the prior exam. Attenuation of the lateral thoracic artery is noted as well related to extrinsic compression by the hematoma. Some mild subcutaneous extension of hemorrhage along the lateral chest wall is again seen similar to that noted on the prior exam. Nonvascular: Significant degenerative changes of the glenohumeral and acromioclavicular joints are seen. No definitive  rib fracture is identified. Visualized structures in the chest and abdomen on the margin of the scan appear within normal limits. Review of the MIP images confirms the above findings. IMPRESSION: Attenuation of the left axillary artery secondary to extrinsic compression. No definitive dissection is identified. Active hemorrhage is noted within the left axillary hematoma likely related to a small branch of the lateral thoracic artery beneath the axillary hematoma. This is best visualized on the coronal imaging (image number 62 of series 10). Critical Value/emergent results were called by telephone at the time of interpretation on 03/11/2024 at 9:48 pm to Dr. SID BONING , who verbally acknowledged these results. Electronically Signed   By: Oneil Devonshire M.D.   On: 03/11/2024 21:51   CT Shoulder Left Wo Contrast Result Date: 03/11/2024 CLINICAL DATA:  Shoulder trauma, fracture of humerus or scapula. Fall, left shoulder injury EXAM: CT OF THE UPPER LEFT EXTREMITY WITHOUT CONTRAST TECHNIQUE: Multidetector CT imaging of the upper left extremity was performed according to the standard protocol. RADIATION DOSE REDUCTION: This exam was performed according to the departmental dose-optimization program which includes automated exposure control,  adjustment of the mA and/or kV according to patient size and/or use of iterative reconstruction technique. COMPARISON:  None Available. FINDINGS: Bones/Joint/Cartilage Normal alignment. No acute fracture or dislocation. Mild acromioclavicular and moderate glenohumeral degenerative arthritis. Degenerative changes are seen within the cervical spine, not optimally profiled on this examination, but with advanced degenerative disc disease at C4-T1 with severe left neuroforaminal narrowing at these levels. Ligaments Suboptimally assessed by CT. Muscles and Tendons There is marked fatty atrophy of the supraspinatus and infraspinatus musculature. Moderate fatty atrophy of the subscapularis musculature. There is hyperdensity within the residual muscle fibers of the subscapularis with associated thickening, best appreciated on 55/8 and 59/10 in keeping with an intramuscular hematoma. There is a lobulated hyperdense collection noted within the interstitial soft tissues of the left axilla measuring roughly 5.4 x 6.0 x 9.5 cm (84/10, 56/8) in keeping with a acute hematoma. There is interstitial hemorrhage noted surrounding this within the left axilla seen tracking into the subclavicular region and inferiorly along the left chest wall. Soft tissues See above.  Left shoulder effusion is present. IMPRESSION: 1. No acute fracture or dislocation. 2. Moderate glenohumeral degenerative arthritis. 3. Marked fatty atrophy of the supraspinatus and infraspinatus musculature. Moderate fatty atrophy of the subscapularis musculature. 4. Intramuscular hematoma within the residual muscle fibers of the subscapularis. 5. Acute hematoma within the left axilla measuring roughly 5.4 x 6.0 x 9.5 cm. Surrounding interstitial hemorrhage with left axilla and chest wall. 6. Extensive degenerative disc disease at C4-T1 with severe left neuroforaminal narrowing at these levels. Electronically Signed   By: Dorethia Molt M.D.   On: 03/11/2024 19:17   DG  Chest Portable 1 View Result Date: 03/11/2024 CLINICAL DATA:  Shortness of breath EXAM: PORTABLE CHEST 1 VIEW COMPARISON:  Chest radiograph dated 03/24/2023 FINDINGS: Implanted loop recorder projects over the medial left lung base. Metallic clip projects over the right apex. Normal lung volumes. Left basilar patchy opacities. No pleural effusion or pneumothorax. Right aortic arch. Similar enlarged cardiomediastinal silhouette. No acute osseous abnormality. IMPRESSION: 1. Left basilar patchy opacities, which may represent atelectasis, aspiration, or pneumonia. 2. Similar enlarged cardiomediastinal silhouette. 3. Right aortic arch. Electronically Signed   By: Limin  Xu M.D.   On: 03/11/2024 18:57   DG Shoulder Left Result Date: 03/11/2024 CLINICAL DATA:  Tripped over screw in apartment and landed on left shoulder. EXAM: LEFT SHOULDER -  2+ VIEW COMPARISON:  Same-day radiograph at 2:57 p.m. FINDINGS: Successful reduction of the left shoulder dislocation. Curvilinear lucency along the inferior glenoid suspicious for nondisplaced fracture. Degenerative changes AC and glenohumeral joints. IMPRESSION: 1. Successful reduction of the left glenohumeral dislocation. 2. Curvilinear lucency along the inferior glenoid suspicious for nondisplaced fracture. CT is recommended for further evaluation. Electronically Signed   By: Norman Gatlin M.D.   On: 03/11/2024 17:38   DG Shoulder Left Result Date: 03/11/2024 CLINICAL DATA:  Left shoulder pain following a fall. EXAM: LEFT SHOULDER - 2+ VIEW COMPARISON:  None Available. FINDINGS: Anterior, inferior dislocation of the humeral head relative to the acromion. Moderate humeral head and neck junction spur formation. No fracture seen. IMPRESSION: Anterior, inferior dislocation of the humeral head. Electronically Signed   By: Elspeth Bathe M.D.   On: 03/11/2024 15:28   ECHOCARDIOGRAM COMPLETE BUBBLE STUDY Result Date: 03/03/2024    ECHOCARDIOGRAM REPORT   Patient Name:   JOSIYAH TOZZI Date of Exam: 03/03/2024 Medical Rec #:  [id number]        Height:       65.0 in Accession #:    [id number]       Weight:       193.8 lb Date of Birth:  [birth year]        BSA:          1.952 m Patient Age:    58 years         BP:           126/85 mmHg Patient Gender: M                HR:           81 bpm. Exam Location:  [facility] Procedure: 2D Echo, Cardiac Doppler, Color Doppler, Saline Contrast Bubble Study            and Intracardiac Opacification Agent (Both Spectral and Color Flow            Doppler were utilized during procedure). Indications:    Stroke l63.9  History:        Patient has prior history of Echocardiogram examinations, most                 recent 08/05/2022. CHF, CAD and Previous Myocardial Infarction,                 COPD; Risk Factors:Hypertension, Diabetes, Dyslipidemia and                 Current Smoker.  Sonographer:    Aida Pizza RCS Referring Phys: [id number] JAN A MANSY IMPRESSIONS  1. Left ventricular ejection fraction, by estimation, is 45 to 50%. The left ventricle has mildly decreased function. The left ventricle demonstrates regional wall motion abnormalities (see scoring diagram/findings for description). Left ventricular diastolic parameters are indeterminate.  2. No LV mural thrombus noted with Definity  contrast.  3. Right ventricular systolic function is normal. The right ventricular size is normal. Tricuspid regurgitation signal is inadequate for assessing PA pressure.  4. Left atrial size was mildly dilated.  5. The mitral valve is degenerative. Trivial mitral valve regurgitation.  6. The aortic valve is tricuspid. There is mild calcification of the aortic valve. Aortic valve regurgitation is not visualized. Aortic valve sclerosis is present, with no evidence of aortic valve stenosis.  7. The inferior vena cava is normal in size with greater than 50% respiratory variability, suggesting right atrial pressure of 3 mmHg.  8.  Agitated saline contrast bubble study was negative,  with no evidence of any interatrial shunt. Comparison(s): Prior images reviewed side by side. LVEF is relatively stable on review of the last few studies. FINDINGS  Left Ventricle: Left ventricular ejection fraction, by estimation, is 45 to 50%. The left ventricle has mildly decreased function. The left ventricle demonstrates regional wall motion abnormalities. Definity  contrast agent was given IV to delineate the left ventricular endocardial borders. The left ventricular internal cavity size was normal in size. There is no left ventricular hypertrophy. Left ventricular diastolic parameters are indeterminate.  LV Wall Scoring: The antero-lateral wall, inferior wall, and posterior wall are hypokinetic. The entire anterior wall, entire septum, and entire apex are normal. Right Ventricle: The right ventricular size is normal. No increase in right ventricular wall thickness. Right ventricular systolic function is normal. Tricuspid regurgitation signal is inadequate for assessing PA pressure. Left Atrium: Left atrial size was mildly dilated. Right Atrium: Right atrial size was normal in size. Pericardium: There is no evidence of pericardial effusion. Mitral Valve: The mitral valve is degenerative in appearance. Trivial mitral valve regurgitation. Tricuspid Valve: The tricuspid valve is grossly normal. Tricuspid valve regurgitation is trivial. Aortic Valve: The aortic valve is tricuspid. There is mild calcification of the aortic valve. Aortic valve regurgitation is not visualized. Aortic valve sclerosis is present, with no evidence of aortic valve stenosis. Pulmonic Valve: The pulmonic valve was grossly normal. Pulmonic valve regurgitation is trivial. Aorta: The aortic root is normal in size and structure. Venous: The inferior vena cava is normal in size with greater than 50% respiratory variability, suggesting right atrial pressure of 3 mmHg. IAS/Shunts: No atrial level shunt detected by color flow Doppler. Agitated  saline contrast was given intravenously to evaluate for intracardiac shunting. Agitated saline contrast bubble study was negative, with no evidence of any interatrial shunt. Additional Comments: 3D was performed not requiring image post processing on an independent workstation and was indeterminate.  LEFT VENTRICLE PLAX 2D LVIDd:         4.80 cm   Diastology LVIDs:         3.60 cm   LV e' medial:    5.77 cm/s LV PW:         1.00 cm   LV E/e' medial:  12.2 LV IVS:        1.00 cm   LV e' lateral:   6.74 cm/s LVOT diam:     2.20 cm   LV E/e' lateral: 10.5 LV SV:         94 LV SV Index:   48 LVOT Area:     3.80 cm  RIGHT VENTRICLE RV S prime:     12.20 cm/s TAPSE (M-mode): 2.2 cm LEFT ATRIUM             Index        RIGHT ATRIUM           Index LA diam:        3.70 cm 1.90 cm/m   RA Area:     20.40 cm LA Vol (A2C):   45.2 ml 23.16 ml/m  RA Volume:   58.90 ml  30.18 ml/m LA Vol (A4C):   66.1 ml 33.87 ml/m LA Biplane Vol: 55.5 ml 28.44 ml/m  AORTIC VALVE LVOT Vmax:   130.00 cm/s LVOT Vmean:  85.100 cm/s LVOT VTI:    0.247 m  AORTA Ao Root diam: 3.70 cm MITRAL VALVE MV Area (PHT): 3.77 cm    SHUNTS  MV Decel Time: 201 msec    Systemic VTI:  0.25 m MV E velocity: 70.60 cm/s  Systemic Diam: 2.20 cm MV A velocity: 76.50 cm/s MV E/A ratio:  0.92 Jayson Sierras MD Electronically signed by Jayson Sierras MD Signature Date/Time: 03/03/2024/11:52:58 AM    Final    CT ANGIO HEAD NECK W WO CM Result Date: 03/02/2024 CLINICAL DATA:  Stroke/TIA, determine embolic source EXAM: CT ANGIOGRAPHY HEAD AND NECK WITH AND WITHOUT CONTRAST TECHNIQUE: Multidetector CT imaging of the head and neck was performed using the standard protocol during bolus administration of intravenous contrast. Multiplanar CT image reconstructions and MIPs were obtained to evaluate the vascular anatomy. Carotid stenosis measurements (when applicable) are obtained utilizing NASCET criteria, using the distal internal carotid diameter as the denominator.  RADIATION DOSE REDUCTION: This exam was performed according to the departmental dose-optimization program which includes automated exposure control, adjustment of the mA and/or kV according to patient size and/or use of iterative reconstruction technique. CONTRAST:  75mL OMNIPAQUE  IOHEXOL  350 MG/ML SOLN COMPARISON:  Same day MRI head. FINDINGS: CT HEAD FINDINGS Brain: Known acute infarct better characterized on recent MRI. No evidence of progressive mass effect or acute hemorrhage. No evidence of mass lesion, midline shift or hydrocephalus. Vascular: See below. Skull: No acute fracture. Sinuses/Orbits: Clear sinuses.  No acute orbital findings. Other: No mastoid effusions. Soft tissue thickening of the ears/pinna bilaterally with areas of calcification, which is chronic. Review of the MIP images confirms the above findings CTA NECK FINDINGS Aortic arch: Great vessel origins are patent without significant stenosis. Right-sided aortic arch. Aortic atherosclerosis. Right carotid system: No evidence of dissection, stenosis (50% or greater), or occlusion. Left carotid system: No evidence of dissection, stenosis (50% or greater), or occlusion. Vertebral arteries: Left dominant. No evidence of dissection, stenosis (50% or greater), or occlusion. Skeleton: Severe multilevel degenerative change. Other neck: No evidence of acute abnormality on limited assessment. Upper chest: Visualized lung apices are clear. Review of the MIP images confirms the above findings CTA HEAD FINDINGS Anterior circulation: Bilateral intracranial ICAs, MCAs, and ACAs are patent without proximal hemodynamically significant stenosis. Posterior circulation: Bilateral intradural vertebral arteries, basilar artery and bilateral posterior cerebral arteries are patent without proximal hemodynamically significant stenosis. Venous sinuses: As permitted by contrast timing, patent. Review of the MIP images confirms the above findings IMPRESSION: 1. No emergent  large vessel occlusion or proximal hemodynamically significant stenosis. 2. Right-sided aortic arch and aortic atherosclerosis (ICD10-I70.0). Electronically Signed   By: Gilmore GORMAN Molt M.D.   On: 03/02/2024 22:36   MR Brain Wo Contrast Result Date: 03/02/2024 CLINICAL DATA:  Neuro deficit, acute, stroke suspected left sided weakness, gait instability, dizziness, nausea (started Sunday night) EXAM: MRI HEAD WITHOUT CONTRAST TECHNIQUE: Multiplanar, multiecho pulse sequences of the brain and surrounding structures were obtained without intravenous contrast. COMPARISON:  MRI of the head dated May 07, 2023. FINDINGS: Brain: There is an acute nonhemorrhagic infarct present at the right thalamus capsular junction seen on image 28 of series 5. There is also spurious high signal on the same image within the left frontal cortex, of no significance. There is age-related cerebral and cerebellar volume loss present. 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 There is no evidence of hemorrhage, mass or hydrocephalus. Vascular: Normal vascular flow voids. Skull and upper cervical spine: Normal marrow signal. No osseous lesions evident. Sinuses/Orbits: Negative. Other: None. IMPRESSION: 1. Acute/subacute lacunar infarct will at the right thalamus capsular junction. 2. These results were called by telephone  at the time of interpretation on 03/02/2024 at 6:51 pm to provider Dr. JACQULYN AHLE , who verbally acknowledged these results. Electronically Signed   By: Evalene Coho M.D.   On: 03/02/2024 18:52   (Echo, Carotid, EGD, Colonoscopy, ERCP)    Subjective: Patient seen and examined.  Mild discomfort of the left arm.  He is very eager and excited to go home.   Discharge Exam: Vitals:   03/19/24 0500 03/19/24 0729  BP: 119/68 113/77  Pulse: 85 87  Resp: 18 16  Temp: 98.4 F (36.9 C) 98.4 F (36.9 C)  SpO2: 95% 92%   Vitals:   03/18/24 2039 03/18/24 2044 03/19/24 0500  03/19/24 0729  BP: 116/66  119/68 113/77  Pulse: 92 90 85 87  Resp: 18 16 18 16   Temp: 98.1 F (36.7 C)  98.4 F (36.9 C) 98.4 F (36.9 C)  TempSrc: Oral  Oral Rectal  SpO2: 95% 96% 95% 92%  Weight:      Height:        General: Pt is alert, awake, not in acute distress Able to walk around. Cardiovascular: RRR, S1/S2 +, no rubs, no gallops Respiratory: CTA bilaterally, no wheezing, no rhonchi Abdominal: Soft, NT, ND, bowel sounds + Extremities:  Ecchymosis and bruising around the shoulder mostly on the anterior side.  Swelling of the left arm from axilla to dorsum of the hand.  Distal neurovascular status intact.  Joint mobility intact except restricted by swelling.  Slight weakness on the left arm.    The results of significant diagnostics from this hospitalization (including imaging, microbiology, ancillary and laboratory) are listed below for reference.     Microbiology: No results found for this or any previous visit (from the past 240 hours).   Labs: BNP (last 3 results) No results for input(s): BNP in the last 8760 hours. Basic Metabolic Panel: Recent Labs  Lab 03/13/24 0828 03/14/24 0632 03/15/24 0614  NA 133* 134* 132*  K 4.0 3.8 3.7  CL 101 101 97*  CO2 22 24 27   GLUCOSE 146* 152* 157*  BUN 12 9 11   CREATININE 0.62 0.60* 0.66  CALCIUM  8.8* 8.6* 9.0   Liver Function Tests: No results for input(s): AST, ALT, ALKPHOS, BILITOT, PROT, ALBUMIN  in the last 168 hours. No results for input(s): LIPASE, AMYLASE in the last 168 hours. No results for input(s): AMMONIA in the last 168 hours. CBC: Recent Labs  Lab 03/13/24 0828 03/13/24 0832 03/14/24 0632 03/14/24 1842 03/15/24 0614 03/17/24 0607  WBC 16.7* 16.6* 12.2*  --  14.3* 12.7*  NEUTROABS  --   --   --   --   --  8.8*  HGB 11.9* 11.9* 11.2* 11.0* 11.1* 11.1*  HCT 35.1* 35.5* 33.3* 32.7* 33.2* 32.7*  MCV 90.9 91.3 91.7  --  90.7 91.3  PLT 167 171 151  --  187 252   Cardiac  Enzymes: No results for input(s): CKTOTAL, CKMB, CKMBINDEX, TROPONINI in the last 168 hours. BNP: Invalid input(s): POCBNP CBG: Recent Labs  Lab 03/18/24 1142 03/18/24 1623 03/18/24 2051 03/19/24 0612 03/19/24 0903  GLUCAP 167* 227* 112* 154* 219*   D-Dimer No results for input(s): DDIMER in the last 72 hours. Hgb A1c No results for input(s): HGBA1C in the last 72 hours. Lipid Profile No results for input(s): CHOL, HDL, LDLCALC, TRIG, CHOLHDL, LDLDIRECT in the last 72 hours. Thyroid  function studies No results for input(s): TSH, T4TOTAL, T3FREE, THYROIDAB in the last 72 hours.  Invalid input(s): FREET3 Anemia work  up No results for input(s): VITAMINB12, FOLATE, FERRITIN, TIBC, IRON, RETICCTPCT in the last 72 hours. Urinalysis    Component Value Date/Time   COLORURINE YELLOW 09/12/2023 0725   APPEARANCEUR CLEAR 09/12/2023 0725   LABSPEC 1.026 09/12/2023 0725   PHURINE 7.0 09/12/2023 0725   GLUCOSEU >=500 (A) 09/12/2023 0725   HGBUR NEGATIVE 09/12/2023 0725   BILIRUBINUR negative 01/04/2024 1140   KETONESUR negative 01/04/2024 1140   KETONESUR NEGATIVE 09/12/2023 0725   PROTEINUR NEGATIVE 09/12/2023 0725   UROBILINOGEN 0.2 01/04/2024 1140   UROBILINOGEN 0.2 05/30/2015 1628   NITRITE Positive (A) 01/04/2024 1140   NITRITE NEGATIVE 09/12/2023 0725   LEUKOCYTESUR Small (1+) (A) 01/04/2024 1140   LEUKOCYTESUR NEGATIVE 09/12/2023 0725   Sepsis Labs Recent Labs  Lab 03/13/24 0832 03/14/24 0632 03/15/24 0614 03/17/24 0607  WBC 16.6* 12.2* 14.3* 12.7*   Microbiology No results found for this or any previous visit (from the past 240 hours).   Time coordinating discharge: 40 minutes  SIGNED:   Renato Applebaum, MD  [facility] 03/19/2024, 10:56 AM

## 2024-03-21 ENCOUNTER — Telehealth: Payer: Self-pay | Admitting: *Deleted

## 2024-03-21 ENCOUNTER — Ambulatory Visit: Payer: Self-pay

## 2024-03-21 NOTE — Transitions of Care (Post Inpatient/ED Visit) (Signed)
 03/21/2024  Name: Randy Hebert MRN: [id number] DOB: 1966/04/16  Today's TOC FU Call Status: Today's TOC FU Call Status:: Successful TOC FU Call Completed TOC FU Call Complete Date: 03/21/24 Patient's Name and Date of Birth confirmed.  Transition Care Management Follow-up Telephone Call Date of Discharge: 03/19/24 Discharge Facility: [facility]) Type of Discharge: Inpatient Admission Primary Inpatient Discharge Diagnosis:: Axillary artery injury How have you been since you were released from the hospital?: Better Any questions or concerns?: No  Items Reviewed: Did you receive and understand the discharge instructions provided?: Yes Any new allergies since your discharge?: No Dietary orders reviewed?: Yes Type of Diet Ordered:: low sodium, carbohydrate modified Do you have support at home?: Yes People in Home [RPT]: alone Name of Support/Comfort Primary Source: can call on daughter and son in law, neighbors Reviewed safety precautions Reviewed signs/ symptoms of infection Reviewed carbohydrate modified diet  AIC = 9.7 Reviewed importance of continuing daily weights, HF action plan reviewed  Medications Reviewed Today: Medications Reviewed Today     Reviewed by Aura Mliss LABOR, RN (Registered Nurse) on 03/21/24 at 1144  Med List Status: <None>   Medication Order Taking? Sig Documenting Provider Last Dose Status Informant  acetaminophen  (TYLENOL ) 500 MG tablet [id number] Yes Take 2 tablets (1,000 mg total) by mouth every 8 (eight) hours as needed for mild pain or fever. Jens Durand, MD  Active Self, Pharmacy Records           Med Note [place], CHUCK KANDICE [place] Mar 02, 2024  7:16 PM)    amLODipine  (NORVASC ) 10 MG tablet [id number] Yes Take 1 tablet (10 mg total) by mouth daily. Alvan Dorn FALCON, MD  Active Self, Pharmacy Records  Ascorbic Acid  (VITAMIN C  PO) [id number] Yes Take 1 tablet by mouth daily. [provider]  Active Pharmacy Records, Self  aspirin  EC 81  MG tablet [id number] Yes Take 1 tablet (81 mg total) by mouth daily. Swallow whole. Maree Bracken D, DO  Active Pharmacy Records, Self  atorvastatin  (LIPITOR ) 40 MG tablet [id number] Yes Take 1 tablet (40 mg total) by mouth daily. Alvan Dorn FALCON, MD  Active Self, Pharmacy Records  Cholecalciferol  (VITAMIN D -3 PO) [id number] Yes Take 1 capsule by mouth daily. [provider]  Active Pharmacy Records, Self  citalopram  (CELEXA ) 20 MG tablet [id number] Yes Take 1 tablet (20 mg total) by mouth daily. Cook, Jayce G, DO  Active Self, Pharmacy Records  clopidogrel  (PLAVIX ) 75 MG tablet [id number] Yes Take 1 tablet (75 mg total) by mouth daily for 21 days. Maree Bracken D, DO  Active Pharmacy Records, Self  dapagliflozin  propanediol (FARXIGA ) 10 MG TABS tablet [id number] Yes Take 1 tablet (10 mg total) by mouth daily. Nida, Gebreselassie W, MD  Active Self, Pharmacy Records  ezetimibe  (ZETIA ) 10 MG tablet [id number] Yes Take 1 tablet (10 mg total) by mouth daily. Alvan Dorn FALCON, MD  Active Self, Pharmacy Records  Ferrous Sulfate  (IRON PO) [id number] Yes Take 1 tablet by mouth daily. [provider]  Active Pharmacy Records, Self  furosemide  (LASIX ) 40 MG tablet [id number] Yes Take 40 mg by mouth daily as needed (excessive swelling). [provider]  Active Pharmacy Records, Self  glipiZIDE  (GLUCOTROL  XL) 5 MG 24 hr tablet [id number]  Take 5 mg by mouth daily.  Patient not taking: Reported on 03/21/2024   [provider]  Active Pharmacy Records, Self  MAGNESIUM  PO [id number] Yes Take 1 tablet by mouth 2 (two) times daily. [provider]  Active Pharmacy Records, Self  metFORMIN  (GLUCOPHAGE ) 500 MG tablet [id number] Yes Take 1 tablet (500 mg total) by mouth 2 (two) times daily with a meal. Nida, Gebreselassie W, MD  Active Self, Pharmacy Records  moxifloxacin (VIGAMOX) 0.5 % ophthalmic solution [id number] Yes Place 1 drop into the left eye 3 (three) times daily. 7 day course  [provider]  Active   naproxen  (NAPROSYN ) 375 MG tablet [id number] Yes Take 375 mg by mouth 2 (two) times daily as needed for moderate pain (pain score 4-6). [provider]  Active Pharmacy Records, Self  nitroGLYCERIN  (NITROSTAT ) 0.4 MG SL tablet [id number] Yes Place 1 tablet (0.4 mg total) under the tongue every 5 (five) minutes x 3 doses as needed (if no relief after 3rd dose, proceed to ED or call 911). Alvan Dorn FALCON, MD  Active Self, Pharmacy Records  oxyCODONE -acetaminophen  (PERCOCET/ROXICET) 5-325 MG tablet [id number] Yes Take 1 tablet by mouth every 4 (four) hours as needed for moderate pain (pain score 4-6). Raenelle Coria, MD  Active   pantoprazole  (PROTONIX ) 40 MG tablet [id number] Yes TAKE (1) TABLET BY MOUTH TWICE DAILY BEFORE A MEAL. Shaaron Lamar HERO, MD  Active Self, Pharmacy Records  RESTASIS  0.05 % ophthalmic emulsion [id number] Yes Place 1 drop into both eyes 2 (two) times daily. [provider]  Active Self, Pharmacy Records  SYMBICORT  160-4.5 MCG/ACT inhaler [id number] Yes INHALE 2 PUFFS INTO THE LUNGS FIRST THING IN THE MORNING AND 2 PUFFS ABOUT 12 HOURS LATER. Cook, Jayce G, DO  Active Self, Pharmacy Records  TRULICITY  1.5 MG/0.5ML EMMANUEL [id number] Yes Inject 1.5 mg into the skin once a week. Lenis Ethelle ORN, MD  Active Self, Pharmacy Records            Home Care and Equipment/Supplies: Were Home Health Services Ordered?: No Any new equipment or medical supplies ordered?: No  Functional Questionnaire: Do you need assistance with bathing/showering or dressing?: No Do you need assistance with meal preparation?: No Do you need assistance with eating?: No Do you have difficulty maintaining continence: No Do you need assistance with getting out of bed/getting out of a chair/moving?: No Do you have difficulty managing or taking your medications?: No  Follow up appointments reviewed: PCP Follow-up appointment confirmed?: Yes Date of PCP  follow-up appointment?: 03/23/24 Follow-up Provider: Jacqulyn Ahle DO  @ 310 pm Specialist Hospital Follow-up appointment confirmed?: No (pt has number for vein and vascular and will call) Reason Specialist Follow-Up Not Confirmed: Patient has Specialist Provider Number and will Call for Appointment Do you need transportation to your follow-up appointment?: No Do you understand care options if your condition(s) worsen?: Yes-patient verbalized understanding  SDOH Interventions Today    Flowsheet Row Most Recent Value  SDOH Interventions   Food Insecurity Interventions Intervention Not Indicated  Housing Interventions Intervention Not Indicated  Transportation Interventions Intervention Not Indicated  Utilities Interventions Intervention Not Indicated    Mliss Creed [facility], BSN RN Care Manager/ Transition of Care [place]/ [facility] Population [facility] [phone]

## 2024-03-21 NOTE — Telephone Encounter (Signed)
 FYI Only or Action Required?: Action required by provider: request for appointment.  Patient was last seen in primary care on 03/01/2024 by Cook, Jayce G, DO.  Called Nurse Triage reporting Shoulder Injury.  Symptoms began several weeks ago.  Interventions attempted: Rest, hydration, or home remedies.  Symptoms are: stable.  Triage Disposition: See PCP When Office is Open (Within 3 Days)  Patient/caregiver understands and will follow disposition?: YesCopied from CRM [id number]. Topic: Clinical - Red Word Triage >> Mar 21, 2024 11:12 AM [place] B wrote: [facility] that prompted transfer to Nurse Triage: pt from hip and up is  swollen Reason for Disposition  [1] After 3 days AND [2] pain not improved  Answer Assessment - Initial Assessment Questions 1. MECHANISM: How did the injury happen?     Tripped on screw.  2. ONSET: When did the injury happen? (e.g., minutes, hours ago)      7/11  5. SIZE: For cuts, bruises, or swelling, ask: How large is it? (e.g., inches or centimeters;  entire joint)      Bruising on left side 6. PAIN: Is there pain? If Yes, ask: How bad is the pain?  (Scale 0-10; or none, mild, moderate, severe) Minimal    Pt is calling for hospital f/u. PT has hx of stroke and fall from 7/11. Pt had to have surgery due to dislocated shoulder. Pt is back and blue. Pt is wanting follow up from hospital on Saturday. Whole left side is numb but is getting better. Pt denies pain at this time.  Protocols used: Shoulder Injury-A-AH

## 2024-03-23 ENCOUNTER — Inpatient Hospital Stay: Admitting: Family Medicine

## 2024-03-23 NOTE — Progress Notes (Deleted)
 POST OPERATIVE OFFICE NOTE    CC:  F/u for surgery  HPI:  This is a 58 y.o. male who is s/p fall onto his left shoulder experiencing dislocation.  He was noted to have an expanding hematoma of the left chest and was brought to the operating room for exploration.  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  JP drain was removed several days later in the hospital prior to discharge home.  ***  Allergies  Allergen Reactions   Coreg  [Carvedilol ] Other (See Comments)    Sinus pause on telemetry >3 seconds. Longest one 9 sec. No AV nodal agent   Zestril  [Lisinopril ] Anaphylaxis, Shortness Of Breath and Swelling    Angioedema, required intubation and mechanical ventilation   Chantix  [place].Calix ] Other (See Comments)    Nightmares Insomnia    Amoxil  [Amoxicillin ] Nausea Only    Current Outpatient Medications  Medication Sig Dispense Refill   acetaminophen  (TYLENOL ) 500 MG tablet Take 2 tablets (1,000 mg total) by mouth every 8 (eight) hours as needed for mild pain or fever.     amLODipine  (NORVASC ) 10 MG tablet Take 1 tablet (10 mg total) by mouth daily. 90 tablet 1   Ascorbic Acid  (VITAMIN C  PO) Take 1 tablet by mouth daily.     aspirin  EC 81 MG tablet Take 1 tablet (81 mg total) by mouth daily. Swallow whole. 30 tablet 12   atorvastatin  (LIPITOR ) 40 MG tablet Take 1 tablet (40 mg total) by mouth daily. 100 tablet 3   Cholecalciferol  (VITAMIN D -3 PO) Take 1 capsule by mouth daily.     citalopram  (CELEXA ) 20 MG tablet Take 1 tablet (20 mg total) by mouth daily. 90 tablet 1   clopidogrel  (PLAVIX ) 75 MG tablet Take 1 tablet (75 mg total) by mouth daily for 21 days. 21 tablet 0   dapagliflozin  propanediol (FARXIGA ) 10 MG TABS tablet Take 1 tablet (10 mg total) by mouth daily. 90 tablet 0   ezetimibe  (ZETIA ) 10 MG tablet Take 1 tablet (10 mg total) by mouth daily. 90 tablet 1   Ferrous Sulfate  (IRON PO) Take 1 tablet by mouth daily.     furosemide  (LASIX ) 40 MG tablet Take 40  mg by mouth daily as needed (excessive swelling).     glipiZIDE  (GLUCOTROL  XL) 5 MG 24 hr tablet Take 5 mg by mouth daily. (Patient not taking: Reported on 03/21/2024)     MAGNESIUM  PO Take 1 tablet by mouth 2 (two) times daily.     metFORMIN  (GLUCOPHAGE ) 500 MG tablet Take 1 tablet (500 mg total) by mouth 2 (two) times daily with a meal. 180 tablet 0   moxifloxacin (VIGAMOX) 0.5 % ophthalmic solution Place 1 drop into the left eye 3 (three) times daily. 7 day course     naproxen  (NAPROSYN ) 375 MG tablet Take 375 mg by mouth 2 (two) times daily as needed for moderate pain (pain score 4-6).     nitroGLYCERIN  (NITROSTAT ) 0.4 MG SL tablet Place 1 tablet (0.4 mg total) under the tongue every 5 (five) minutes x 3 doses as needed (if no relief after 3rd dose, proceed to ED or call 911). 25 tablet 3   oxyCODONE -acetaminophen  (PERCOCET/ROXICET) 5-325 MG tablet Take 1 tablet by mouth every 4 (four) hours as needed for moderate pain (pain score 4-6). 20 tablet 0   pantoprazole  (PROTONIX ) 40 MG tablet TAKE (1) TABLET BY MOUTH TWICE DAILY BEFORE A MEAL. 180 tablet 1   RESTASIS  0.05 % ophthalmic emulsion Place 1 drop  into both eyes 2 (two) times daily.     SYMBICORT  160-4.5 MCG/ACT inhaler INHALE 2 PUFFS INTO THE LUNGS FIRST THING IN THE MORNING AND 2 PUFFS ABOUT 12 HOURS LATER. 10.2 g 12   TRULICITY  1.5 MG/0.5ML SOAJ Inject 1.5 mg into the skin once a week. 2 mL 1   No current facility-administered medications for this visit.     ROS:  See HPI  Physical Exam:  There were no vitals filed for this visit.***  Incision:  *** Extremities:  *** Neuro: ***   Assessment/Plan:  This is a 58 y.o. male who is s/p: Repair of left axillary artery pseudoaneurysm after a fall onto the left shoulder with expanding hematoma of the left chest and arm  -***   Donnice Sender, PA-C [facility] [phone]  Clinic MD:  ***

## 2024-03-24 ENCOUNTER — Emergency Department (HOSPITAL_COMMUNITY)

## 2024-03-24 ENCOUNTER — Other Ambulatory Visit: Payer: Self-pay

## 2024-03-24 ENCOUNTER — Inpatient Hospital Stay: Admitting: Nurse Practitioner

## 2024-03-24 ENCOUNTER — Inpatient Hospital Stay (HOSPITAL_COMMUNITY)
Admission: EM | Admit: 2024-03-24 | Discharge: 2024-03-29 | DRG: 253 | Disposition: A | Discharge status: Home / Self Care | Attending: Internal Medicine | Admitting: Internal Medicine

## 2024-03-24 ENCOUNTER — Emergency Department (HOSPITAL_COMMUNITY): Admitting: Certified Registered Nurse Anesthetist

## 2024-03-24 ENCOUNTER — Encounter (HOSPITAL_COMMUNITY)
Admission: EM | Disposition: A | Discharge status: Home / Self Care | Payer: Self-pay | Source: Home / Self Care | Attending: Internal Medicine

## 2024-03-24 ENCOUNTER — Encounter (HOSPITAL_COMMUNITY): Payer: Self-pay

## 2024-03-24 DIAGNOSIS — E1169 Type 2 diabetes mellitus with other specified complication: Secondary | ICD-10-CM | POA: Diagnosis present

## 2024-03-24 DIAGNOSIS — I4510 Unspecified right bundle-branch block: Secondary | ICD-10-CM | POA: Diagnosis present

## 2024-03-24 DIAGNOSIS — B952 Enterococcus as the cause of diseases classified elsewhere: Secondary | ICD-10-CM | POA: Diagnosis present

## 2024-03-24 DIAGNOSIS — Z8701 Personal history of pneumonia (recurrent): Secondary | ICD-10-CM

## 2024-03-24 DIAGNOSIS — W19XXXS Unspecified fall, sequela: Secondary | ICD-10-CM | POA: Diagnosis present

## 2024-03-24 DIAGNOSIS — Z7951 Long term (current) use of inhaled steroids: Secondary | ICD-10-CM

## 2024-03-24 DIAGNOSIS — Z7982 Long term (current) use of aspirin: Secondary | ICD-10-CM | POA: Diagnosis not present

## 2024-03-24 DIAGNOSIS — K2270 Barrett's esophagus without dysplasia: Secondary | ICD-10-CM | POA: Diagnosis present

## 2024-03-24 DIAGNOSIS — S40022A Contusion of left upper arm, initial encounter: Secondary | ICD-10-CM | POA: Diagnosis present

## 2024-03-24 DIAGNOSIS — Z955 Presence of coronary angioplasty implant and graft: Secondary | ICD-10-CM

## 2024-03-24 DIAGNOSIS — S43005S Unspecified dislocation of left shoulder joint, sequela: Secondary | ICD-10-CM

## 2024-03-24 DIAGNOSIS — I509 Heart failure, unspecified: Secondary | ICD-10-CM | POA: Diagnosis present

## 2024-03-24 DIAGNOSIS — D62 Acute posthemorrhagic anemia: Secondary | ICD-10-CM | POA: Diagnosis not present

## 2024-03-24 DIAGNOSIS — Z96641 Presence of right artificial hip joint: Secondary | ICD-10-CM | POA: Diagnosis present

## 2024-03-24 DIAGNOSIS — Z820 Family history of epilepsy and other diseases of the nervous system: Secondary | ICD-10-CM

## 2024-03-24 DIAGNOSIS — K7460 Unspecified cirrhosis of liver: Secondary | ICD-10-CM | POA: Diagnosis present

## 2024-03-24 DIAGNOSIS — Z9889 Other specified postprocedural states: Secondary | ICD-10-CM

## 2024-03-24 DIAGNOSIS — F17210 Nicotine dependence, cigarettes, uncomplicated: Secondary | ICD-10-CM | POA: Diagnosis present

## 2024-03-24 DIAGNOSIS — I2510 Atherosclerotic heart disease of native coronary artery without angina pectoris: Secondary | ICD-10-CM | POA: Diagnosis present

## 2024-03-24 DIAGNOSIS — I110 Hypertensive heart disease with heart failure: Secondary | ICD-10-CM

## 2024-03-24 DIAGNOSIS — E66811 Obesity, class 1: Secondary | ICD-10-CM | POA: Diagnosis not present

## 2024-03-24 DIAGNOSIS — Z8616 Personal history of COVID-19: Secondary | ICD-10-CM | POA: Diagnosis not present

## 2024-03-24 DIAGNOSIS — Z7902 Long term (current) use of antithrombotics/antiplatelets: Secondary | ICD-10-CM

## 2024-03-24 DIAGNOSIS — Z881 Allergy status to other antibiotic agents status: Secondary | ICD-10-CM

## 2024-03-24 DIAGNOSIS — Z8249 Family history of ischemic heart disease and other diseases of the circulatory system: Secondary | ICD-10-CM

## 2024-03-24 DIAGNOSIS — E876 Hypokalemia: Secondary | ICD-10-CM | POA: Diagnosis present

## 2024-03-24 DIAGNOSIS — Q2547 Right aortic arch: Secondary | ICD-10-CM | POA: Diagnosis not present

## 2024-03-24 DIAGNOSIS — I97638 Postprocedural hematoma of a circulatory system organ or structure following other circulatory system procedure: Secondary | ICD-10-CM

## 2024-03-24 DIAGNOSIS — J4489 Other specified chronic obstructive pulmonary disease: Secondary | ICD-10-CM | POA: Diagnosis present

## 2024-03-24 DIAGNOSIS — I10 Essential (primary) hypertension: Secondary | ICD-10-CM | POA: Diagnosis not present

## 2024-03-24 DIAGNOSIS — I69354 Hemiplegia and hemiparesis following cerebral infarction affecting left non-dominant side: Secondary | ICD-10-CM | POA: Diagnosis not present

## 2024-03-24 DIAGNOSIS — I729 Aneurysm of unspecified site: Secondary | ICD-10-CM | POA: Diagnosis present

## 2024-03-24 DIAGNOSIS — I25118 Atherosclerotic heart disease of native coronary artery with other forms of angina pectoris: Secondary | ICD-10-CM | POA: Diagnosis not present

## 2024-03-24 DIAGNOSIS — Z6831 Body mass index (BMI) 31.0-31.9, adult: Secondary | ICD-10-CM | POA: Diagnosis not present

## 2024-03-24 DIAGNOSIS — E1159 Type 2 diabetes mellitus with other circulatory complications: Secondary | ICD-10-CM

## 2024-03-24 DIAGNOSIS — M25519 Pain in unspecified shoulder: Secondary | ICD-10-CM | POA: Diagnosis not present

## 2024-03-24 DIAGNOSIS — Z87892 Personal history of anaphylaxis: Secondary | ICD-10-CM

## 2024-03-24 DIAGNOSIS — K219 Gastro-esophageal reflux disease without esophagitis: Secondary | ICD-10-CM | POA: Diagnosis present

## 2024-03-24 DIAGNOSIS — G4733 Obstructive sleep apnea (adult) (pediatric): Secondary | ICD-10-CM | POA: Diagnosis present

## 2024-03-24 DIAGNOSIS — B954 Other streptococcus as the cause of diseases classified elsewhere: Secondary | ICD-10-CM | POA: Diagnosis present

## 2024-03-24 DIAGNOSIS — F32A Depression, unspecified: Secondary | ICD-10-CM | POA: Diagnosis present

## 2024-03-24 DIAGNOSIS — I700 Atherosclerosis of aorta: Secondary | ICD-10-CM | POA: Diagnosis present

## 2024-03-24 DIAGNOSIS — Z8261 Family history of arthritis: Secondary | ICD-10-CM

## 2024-03-24 DIAGNOSIS — Z7984 Long term (current) use of oral hypoglycemic drugs: Secondary | ICD-10-CM | POA: Diagnosis not present

## 2024-03-24 DIAGNOSIS — B9689 Other specified bacterial agents as the cause of diseases classified elsewhere: Secondary | ICD-10-CM | POA: Diagnosis present

## 2024-03-24 DIAGNOSIS — K760 Fatty (change of) liver, not elsewhere classified: Secondary | ICD-10-CM | POA: Diagnosis present

## 2024-03-24 DIAGNOSIS — I721 Aneurysm of artery of upper extremity: Principal | ICD-10-CM | POA: Diagnosis present

## 2024-03-24 DIAGNOSIS — Z833 Family history of diabetes mellitus: Secondary | ICD-10-CM

## 2024-03-24 DIAGNOSIS — I440 Atrioventricular block, first degree: Secondary | ICD-10-CM | POA: Diagnosis present

## 2024-03-24 DIAGNOSIS — Z7401 Bed confinement status: Secondary | ICD-10-CM | POA: Diagnosis not present

## 2024-03-24 DIAGNOSIS — Z888 Allergy status to other drugs, medicaments and biological substances status: Secondary | ICD-10-CM

## 2024-03-24 DIAGNOSIS — Z79899 Other long term (current) drug therapy: Secondary | ICD-10-CM

## 2024-03-24 DIAGNOSIS — I5042 Chronic combined systolic (congestive) and diastolic (congestive) heart failure: Secondary | ICD-10-CM

## 2024-03-24 DIAGNOSIS — E785 Hyperlipidemia, unspecified: Secondary | ICD-10-CM | POA: Diagnosis present

## 2024-03-24 DIAGNOSIS — Z7985 Long-term (current) use of injectable non-insulin antidiabetic drugs: Secondary | ICD-10-CM

## 2024-03-24 DIAGNOSIS — I252 Old myocardial infarction: Secondary | ICD-10-CM | POA: Diagnosis not present

## 2024-03-24 DIAGNOSIS — Z860101 Personal history of adenomatous and serrated colon polyps: Secondary | ICD-10-CM

## 2024-03-24 DIAGNOSIS — L089 Local infection of the skin and subcutaneous tissue, unspecified: Secondary | ICD-10-CM | POA: Diagnosis not present

## 2024-03-24 DIAGNOSIS — Z823 Family history of stroke: Secondary | ICD-10-CM

## 2024-03-24 HISTORY — PX: ARTERY EXPLORATION: SHX5110

## 2024-03-24 HISTORY — PX: ULTRASOUND GUIDANCE FOR VASCULAR ACCESS: SHX6516

## 2024-03-24 LAB — COMPREHENSIVE METABOLIC PANEL WITH GFR
ALT: 38 U/L (ref 0–44)
AST: 47 U/L — ABNORMAL HIGH (ref 15–41)
Albumin: 3.8 g/dL (ref 3.5–5.0)
Alkaline Phosphatase: 210 U/L — ABNORMAL HIGH (ref 38–126)
Anion gap: 16 — ABNORMAL HIGH (ref 5–15)
BUN: 7 mg/dL (ref 6–20)
CO2: 19 mmol/L — ABNORMAL LOW (ref 22–32)
Calcium: 9.3 mg/dL (ref 8.9–10.3)
Chloride: 102 mmol/L (ref 98–111)
Creatinine, Ser: 0.63 mg/dL (ref 0.61–1.24)
GFR, Estimated: >60 mL/min (ref >60)
Glucose, Bld: 180 mg/dL — ABNORMAL HIGH (ref 70–99)
Potassium: 3.4 mmol/L — ABNORMAL LOW (ref 3.5–5.1)
Sodium: 137 mmol/L (ref 135–145)
Total Bilirubin: 3 mg/dL — ABNORMAL HIGH (ref 0.0–1.2)
Total Protein: 7.8 g/dL (ref 6.5–8.1)

## 2024-03-24 LAB — TYPE AND SCREEN
ABO/RH(D): O POS
Antibody Screen: NEGATIVE

## 2024-03-24 LAB — GLUCOSE, CAPILLARY
Glucose-Capillary: 245 mg/dL — ABNORMAL HIGH (ref 70–99)
Glucose-Capillary: 170 mg/dL — ABNORMAL HIGH (ref 70–99)
Glucose-Capillary: 138 mg/dL — ABNORMAL HIGH (ref 70–99)
Glucose-Capillary: 188 mg/dL — ABNORMAL HIGH (ref 70–99)
Glucose-Capillary: 271 mg/dL — ABNORMAL HIGH (ref 70–99)

## 2024-03-24 LAB — CBC
HCT: 39 % (ref 39.0–52.0)
HCT: 34.6 % — ABNORMAL LOW (ref 39.0–52.0)
Hemoglobin: 12.7 g/dL — ABNORMAL LOW (ref 13.0–17.0)
Hemoglobin: 11 g/dL — ABNORMAL LOW (ref 13.0–17.0)
MCH: 31.2 pg (ref 26.0–34.0)
MCH: 30.7 pg (ref 26.0–34.0)
MCHC: 32.6 g/dL (ref 30.0–36.0)
MCHC: 31.8 g/dL (ref 30.0–36.0)
MCV: 95.8 fL (ref 80.0–100.0)
MCV: 96.6 fL (ref 80.0–100.0)
Platelets: 362 K/uL (ref 150–400)
Platelets: 278 K/uL (ref 150–400)
RBC: 4.07 MIL/uL — ABNORMAL LOW (ref 4.22–5.81)
RBC: 3.58 MIL/uL — ABNORMAL LOW (ref 4.22–5.81)
RDW: 16.4 % — ABNORMAL HIGH (ref 11.5–15.5)
RDW: 16.3 % — ABNORMAL HIGH (ref 11.5–15.5)
WBC: 11.6 K/uL — ABNORMAL HIGH (ref 4.0–10.5)
WBC: 12.4 K/uL — ABNORMAL HIGH (ref 4.0–10.5)
nRBC: 0 % (ref 0.0–0.2)
nRBC: 0 % (ref 0.0–0.2)

## 2024-03-24 LAB — HEMOGLOBIN A1C
Hgb A1c MFr Bld: 5.1 % (ref 4.8–5.6)
Mean Plasma Glucose: 99.67 mg/dL

## 2024-03-24 LAB — SURGICAL PCR SCREEN
MRSA, PCR: NEGATIVE
Staphylococcus aureus: NEGATIVE

## 2024-03-24 LAB — TROPONIN I (HIGH SENSITIVITY)
Troponin I (High Sensitivity): 14 ng/L (ref <18)
Troponin I (High Sensitivity): 11 ng/L (ref <18)

## 2024-03-24 LAB — CREATININE, SERUM
Creatinine, Ser: 0.68 mg/dL (ref 0.61–1.24)
GFR, Estimated: >60 mL/min (ref >60)

## 2024-03-24 SURGERY — EXPLORATION, ARTERY
Anesthesia: General | Site: Groin | Laterality: Left

## 2024-03-24 MED ORDER — PROPOFOL 10 MG/ML IV BOLUS
INTRAVENOUS | Status: DC | PRN
  Administered 2024-03-24: 110 mg via INTRAVENOUS

## 2024-03-24 MED ORDER — CEFAZOLIN SODIUM-DEXTROSE 2-3 GM-%(50ML) IV SOLR
INTRAVENOUS | Status: DC | PRN
  Administered 2024-03-24: 2 g via INTRAVENOUS

## 2024-03-24 MED ORDER — CEFAZOLIN SODIUM-DEXTROSE 2-4 GM/100ML-% IV SOLN
2.0000 g | Freq: Three times a day (TID) | INTRAVENOUS | Status: AC
  Administered 2024-03-24 – 2024-03-26 (×6): 2 g via INTRAVENOUS
  Filled 2024-03-24 (×6): qty 100

## 2024-03-24 MED ORDER — HYDROMORPHONE HCL 1 MG/ML IJ SOLN
1.0000 mg | Freq: Once | INTRAMUSCULAR | Status: AC
  Administered 2024-03-24: 1 mg via INTRAVENOUS
  Filled 2024-03-24: qty 1

## 2024-03-24 MED ORDER — OXYCODONE HCL 5 MG/5ML PO SOLN: 5.0000 mg | Freq: Once | ORAL | Status: DC | PRN

## 2024-03-24 MED ORDER — 0.9 % SODIUM CHLORIDE (POUR BTL) OPTIME
TOPICAL | Status: DC | PRN
Start: 2024-03-24 — End: 2024-03-24
  Administered 2024-03-24: 1000 mL

## 2024-03-24 MED ORDER — OXYCODONE HCL 5 MG PO TABS
5.0000 mg | ORAL_TABLET | ORAL | Status: DC | PRN
  Administered 2024-03-24 (×2): 5 mg via ORAL
  Administered 2024-03-25 – 2024-03-29 (×16): 10 mg via ORAL
  Filled 2024-03-24 (×13): qty 2
  Filled 2024-03-24: qty 1
  Filled 2024-03-24 (×2): qty 2
  Filled 2024-03-24: qty 1
  Filled 2024-03-24: qty 2

## 2024-03-24 MED ORDER — EZETIMIBE 10 MG PO TABS
10.0000 mg | ORAL_TABLET | Freq: Every day | ORAL | Status: DC
  Administered 2024-03-24 – 2024-03-29 (×5): 10 mg via ORAL
  Filled 2024-03-24 (×6): qty 1

## 2024-03-24 MED ORDER — HYDRALAZINE HCL 20 MG/ML IJ SOLN: 5.0000 mg | INTRAMUSCULAR | Status: DC | PRN

## 2024-03-24 MED ORDER — FENTANYL CITRATE (PF) 250 MCG/5ML IJ SOLN
INTRAMUSCULAR | Status: AC
  Filled 2024-03-24: qty 5

## 2024-03-24 MED ORDER — CEFAZOLIN SODIUM-DEXTROSE 2-4 GM/100ML-% IV SOLN: 2.0000 g | Freq: Two times a day (BID) | INTRAVENOUS | Status: DC

## 2024-03-24 MED ORDER — MORPHINE SULFATE (PF) 2 MG/ML IV SOLN
1.0000 mg | INTRAVENOUS | Status: DC | PRN
  Administered 2024-03-25 (×2): 1 mg via INTRAVENOUS
  Filled 2024-03-24 (×2): qty 1

## 2024-03-24 MED ORDER — HEPARIN 6000 UNIT IRRIGATION SOLUTION
Status: AC
  Filled 2024-03-24: qty 500

## 2024-03-24 MED ORDER — PANTOPRAZOLE SODIUM 40 MG PO TBEC
40.0000 mg | DELAYED_RELEASE_TABLET | Freq: Every day | ORAL | Status: DC
  Administered 2024-03-24 – 2024-03-29 (×6): 40 mg via ORAL
  Filled 2024-03-24 (×6): qty 1

## 2024-03-24 MED ORDER — MIDAZOLAM HCL 2 MG/2ML IJ SOLN: 0.5000 mg | Freq: Once | INTRAMUSCULAR | Status: DC | PRN

## 2024-03-24 MED ORDER — CEFAZOLIN SODIUM 1 G IJ SOLR
INTRAMUSCULAR | Status: AC
  Filled 2024-03-24: qty 20

## 2024-03-24 MED ORDER — SODIUM CHLORIDE 0.9 % WEIGHT BASED INFUSION
75.0000 mL/h | INTRAVENOUS | Status: AC
  Administered 2024-03-24: 75 mL/h via INTRAVENOUS

## 2024-03-24 MED ORDER — PROPOFOL 10 MG/ML IV BOLUS
INTRAVENOUS | Status: AC
  Filled 2024-03-24: qty 20

## 2024-03-24 MED ORDER — HEPARIN SODIUM (PORCINE) 1000 UNIT/ML IJ SOLN
INTRAMUSCULAR | Status: DC | PRN
  Administered 2024-03-24: 4000 [IU] via INTRAVENOUS
  Administered 2024-03-24: 5000 [IU] via INTRAVENOUS

## 2024-03-24 MED ORDER — LIDOCAINE 2% (20 MG/ML) 5 ML SYRINGE
INTRAMUSCULAR | Status: DC | PRN
  Administered 2024-03-24: 60 mg via INTRAVENOUS

## 2024-03-24 MED ORDER — ACETAMINOPHEN 325 MG PO TABS
650.0000 mg | ORAL_TABLET | ORAL | Status: DC | PRN
Start: 2024-03-24 — End: 2024-03-25

## 2024-03-24 MED ORDER — ORAL CARE MOUTH RINSE: 15.0000 mL | Freq: Once | OROMUCOSAL | Status: AC

## 2024-03-24 MED ORDER — SUGAMMADEX SODIUM 200 MG/2ML IV SOLN
INTRAVENOUS | Status: DC | PRN
  Administered 2024-03-24: 200 mg via INTRAVENOUS

## 2024-03-24 MED ORDER — IOHEXOL 350 MG/ML SOLN
100.0000 mL | Freq: Once | INTRAVENOUS | Status: AC | PRN
  Administered 2024-03-24: 100 mL via INTRAVENOUS

## 2024-03-24 MED ORDER — FENTANYL CITRATE (PF) 100 MCG/2ML IJ SOLN: 25.0000 ug | INTRAMUSCULAR | Status: DC | PRN

## 2024-03-24 MED ORDER — LABETALOL HCL 5 MG/ML IV SOLN: 10.0000 mg | INTRAVENOUS | Status: DC | PRN

## 2024-03-24 MED ORDER — SODIUM CHLORIDE 0.9% FLUSH
3.0000 mL | Freq: Two times a day (BID) | INTRAVENOUS | Status: DC
  Administered 2024-03-24 – 2024-03-29 (×10): 3 mL via INTRAVENOUS

## 2024-03-24 MED ORDER — IODIXANOL 320 MG/ML IV SOLN
INTRAVENOUS | Status: DC | PRN
Start: 2024-03-24 — End: 2024-03-24
  Administered 2024-03-24: 48 mL via INTRA_ARTERIAL

## 2024-03-24 MED ORDER — HEPARIN SODIUM (PORCINE) 5000 UNIT/ML IJ SOLN
5000.0000 [IU] | Freq: Three times a day (TID) | INTRAMUSCULAR | Status: DC
  Administered 2024-03-25 – 2024-03-27 (×7): 5000 [IU] via SUBCUTANEOUS
  Filled 2024-03-24 (×7): qty 1

## 2024-03-24 MED ORDER — INSULIN ASPART 100 UNIT/ML IJ SOLN
0.0000 [IU] | Freq: Three times a day (TID) | INTRAMUSCULAR | Status: DC
  Administered 2024-03-24 – 2024-03-25 (×2): 3 [IU] via SUBCUTANEOUS
  Administered 2024-03-25: 8 [IU] via SUBCUTANEOUS
  Administered 2024-03-26 (×2): 3 [IU] via SUBCUTANEOUS
  Administered 2024-03-27: 2 [IU] via SUBCUTANEOUS
  Administered 2024-03-27: 3 [IU] via SUBCUTANEOUS
  Administered 2024-03-27 – 2024-03-29 (×6): 2 [IU] via SUBCUTANEOUS

## 2024-03-24 MED ORDER — OXYCODONE HCL 5 MG PO TABS: 5.0000 mg | ORAL_TABLET | Freq: Once | ORAL | Status: DC | PRN

## 2024-03-24 MED ORDER — AMLODIPINE BESYLATE 10 MG PO TABS
10.0000 mg | ORAL_TABLET | Freq: Every day | ORAL | Status: DC
  Administered 2024-03-24 – 2024-03-29 (×6): 10 mg via ORAL
  Filled 2024-03-24 (×6): qty 1

## 2024-03-24 MED ORDER — INSULIN ASPART 100 UNIT/ML IJ SOLN
0.0000 [IU] | Freq: Every day | INTRAMUSCULAR | Status: DC
  Administered 2024-03-24: 3 [IU] via SUBCUTANEOUS

## 2024-03-24 MED ORDER — DAPAGLIFLOZIN PROPANEDIOL 10 MG PO TABS
10.0000 mg | ORAL_TABLET | Freq: Every day | ORAL | Status: DC
  Administered 2024-03-25 – 2024-03-29 (×5): 10 mg via ORAL
  Filled 2024-03-24 (×5): qty 1

## 2024-03-24 MED ORDER — SODIUM CHLORIDE 0.9% FLUSH: 3.0000 mL | INTRAVENOUS | Status: DC | PRN

## 2024-03-24 MED ORDER — CITALOPRAM HYDROBROMIDE 20 MG PO TABS
20.0000 mg | ORAL_TABLET | Freq: Every day | ORAL | Status: DC
  Administered 2024-03-24 – 2024-03-29 (×6): 20 mg via ORAL
  Filled 2024-03-24 (×6): qty 1

## 2024-03-24 MED ORDER — ONDANSETRON HCL 4 MG/2ML IJ SOLN: 4.0000 mg | Freq: Four times a day (QID) | INTRAMUSCULAR | Status: DC | PRN

## 2024-03-24 MED ORDER — LACTATED RINGERS IV SOLN: INTRAVENOUS | Status: DC

## 2024-03-24 MED ORDER — ROCURONIUM BROMIDE 10 MG/ML (PF) SYRINGE
PREFILLED_SYRINGE | INTRAVENOUS | Status: DC | PRN
  Administered 2024-03-24: 50 mg via INTRAVENOUS

## 2024-03-24 MED ORDER — ONDANSETRON HCL 4 MG/2ML IJ SOLN
4.0000 mg | Freq: Once | INTRAMUSCULAR | Status: AC
  Administered 2024-03-24: 4 mg via INTRAVENOUS
  Filled 2024-03-24: qty 2

## 2024-03-24 MED ORDER — CLOPIDOGREL BISULFATE 75 MG PO TABS
75.0000 mg | ORAL_TABLET | Freq: Every day | ORAL | Status: DC
  Administered 2024-03-24 – 2024-03-29 (×6): 75 mg via ORAL
  Filled 2024-03-24 (×6): qty 1

## 2024-03-24 MED ORDER — DEXAMETHASONE SODIUM PHOSPHATE 10 MG/ML IJ SOLN
INTRAMUSCULAR | Status: DC | PRN
  Administered 2024-03-24: 10 mg via INTRAVENOUS

## 2024-03-24 MED ORDER — INSULIN ASPART 100 UNIT/ML IJ SOLN
0.0000 [IU] | INTRAMUSCULAR | Status: AC | PRN
  Administered 2024-03-24: 2 [IU] via SUBCUTANEOUS
  Administered 2024-03-24: 6 [IU] via SUBCUTANEOUS
  Filled 2024-03-24 (×2): qty 1

## 2024-03-24 MED ORDER — VASHE WOUND IRRIGATION OPTIME
TOPICAL | Status: DC | PRN
Start: 2024-03-24 — End: 2024-03-24
  Administered 2024-03-24: 34 [oz_av]

## 2024-03-24 MED ORDER — POTASSIUM CHLORIDE CRYS ER 20 MEQ PO TBCR: 20.0000 meq | EXTENDED_RELEASE_TABLET | Freq: Every day | ORAL | Status: DC | PRN

## 2024-03-24 MED ORDER — MIDAZOLAM HCL 2 MG/2ML IJ SOLN
INTRAMUSCULAR | Status: AC
  Filled 2024-03-24: qty 2

## 2024-03-24 MED ORDER — ASPIRIN 81 MG PO TBEC
81.0000 mg | DELAYED_RELEASE_TABLET | Freq: Every day | ORAL | Status: DC
  Administered 2024-03-24 – 2024-03-29 (×6): 81 mg via ORAL
  Filled 2024-03-24 (×6): qty 1

## 2024-03-24 MED ORDER — SODIUM CHLORIDE 0.9 % IV SOLN: 250.0000 mL | INTRAVENOUS | Status: AC | PRN

## 2024-03-24 MED ORDER — PHENYLEPHRINE HCL-NACL 20-0.9 MG/250ML-% IV SOLN
INTRAVENOUS | Status: DC | PRN
  Administered 2024-03-24: 30 ug/min via INTRAVENOUS

## 2024-03-24 MED ORDER — CHLORHEXIDINE GLUCONATE 0.12 % MT SOLN
OROMUCOSAL | Status: AC
  Administered 2024-03-24: 15 mL via OROMUCOSAL
  Filled 2024-03-24: qty 15

## 2024-03-24 MED ORDER — MIDAZOLAM HCL 2 MG/2ML IJ SOLN
INTRAMUSCULAR | Status: DC | PRN
  Administered 2024-03-24: 2 mg via INTRAVENOUS

## 2024-03-24 MED ORDER — FENTANYL CITRATE (PF) 250 MCG/5ML IJ SOLN
INTRAMUSCULAR | Status: DC | PRN
  Administered 2024-03-24: 100 ug via INTRAVENOUS

## 2024-03-24 MED ORDER — ATORVASTATIN CALCIUM 40 MG PO TABS
40.0000 mg | ORAL_TABLET | Freq: Every day | ORAL | Status: DC
  Administered 2024-03-24: 40 mg via ORAL
  Filled 2024-03-24: qty 1

## 2024-03-24 MED ORDER — ACETAMINOPHEN 10 MG/ML IV SOLN
INTRAVENOUS | Status: DC | PRN
Start: 2024-03-24 — End: 2024-03-24
  Administered 2024-03-24: 1000 mg via INTRAVENOUS

## 2024-03-24 MED ORDER — MEPERIDINE HCL 25 MG/ML IJ SOLN: 6.2500 mg | INTRAMUSCULAR | Status: DC | PRN

## 2024-03-24 MED ORDER — METFORMIN HCL 500 MG PO TABS
500.0000 mg | ORAL_TABLET | Freq: Two times a day (BID) | ORAL | Status: DC
  Administered 2024-03-26 – 2024-03-29 (×7): 500 mg via ORAL
  Filled 2024-03-24 (×9): qty 1

## 2024-03-24 MED ORDER — HEPARIN 6000 UNIT IRRIGATION SOLUTION
Status: DC | PRN
  Administered 2024-03-24: 1

## 2024-03-24 MED ORDER — CHLORHEXIDINE GLUCONATE 0.12 % MT SOLN: 15.0000 mL | Freq: Once | OROMUCOSAL | Status: AC

## 2024-03-24 MED ORDER — ONDANSETRON HCL 4 MG/2ML IJ SOLN
INTRAMUSCULAR | Status: DC | PRN
  Administered 2024-03-24: 4 mg via INTRAVENOUS

## 2024-03-24 MED ORDER — MAGNESIUM SULFATE 2 GM/50ML IV SOLN: 2.0000 g | Freq: Every day | INTRAVENOUS | Status: DC | PRN

## 2024-03-24 SURGICAL SUPPLY — 58 items
BAG COUNTER SPONGE SURGICOUNT (BAG) ×2 IMPLANT
BAG SNAP BAND KOVER 36X36 (MISCELLANEOUS) IMPLANT
BENZOIN TINCTURE PRP APPL 2/3 (GAUZE/BANDAGES/DRESSINGS) ×2 IMPLANT
BIOPATCH RED 1 DISK 7.0 (GAUZE/BANDAGES/DRESSINGS) IMPLANT
BNDG ELASTIC 4INX 5YD STR LF (GAUZE/BANDAGES/DRESSINGS) IMPLANT
BNDG ELASTIC 6INX 5YD STR LF (GAUZE/BANDAGES/DRESSINGS) IMPLANT
CANISTER SUCTION 3000ML PPV (SUCTIONS) ×2 IMPLANT
CANNULA VESSEL 3MM 2 BLNT TIP (CANNULA) ×4 IMPLANT
CATH ACCU-VU SIZ PIG 5F 100CM (CATHETERS) IMPLANT
CATH ANGIO 5F BER2 100CM (CATHETERS) IMPLANT
CATH OMNI FLUSH 5F 65CM (CATHETERS) IMPLANT
CHLORAPREP W/TINT 26 (MISCELLANEOUS) ×2 IMPLANT
CLEANSER WND VASHE INSTL 34OZ (WOUND CARE) IMPLANT
CLOSURE PERCLOSE PROSTYLE (VASCULAR PRODUCTS) IMPLANT
COVER DOME SNAP 22 D (MISCELLANEOUS) IMPLANT
COVER PROBE W GEL 5X96 (DRAPES) IMPLANT
DRAPE FEMORAL ANGIO 80X135IN (DRAPES) IMPLANT
DRSG TEGADERM 4X4.75 (GAUZE/BANDAGES/DRESSINGS) IMPLANT
ELECTRODE REM PT RTRN 9FT ADLT (ELECTROSURGICAL) ×2 IMPLANT
EVACUATOR SILICONE 100CC (DRAIN) IMPLANT
GAUZE SPONGE 4X4 12PLY STRL (GAUZE/BANDAGES/DRESSINGS) ×2 IMPLANT
GAUZE XEROFORM 1X8 LF (GAUZE/BANDAGES/DRESSINGS) IMPLANT
GLIDEWIRE ADV .035X260CM (WIRE) IMPLANT
GLOVE BIO SURGEON STRL SZ8 (GLOVE) ×2 IMPLANT
GOWN STRL REUS W/ TWL LRG LVL3 (GOWN DISPOSABLE) ×4 IMPLANT
GOWN STRL REUS W/ TWL XL LVL3 (GOWN DISPOSABLE) ×2 IMPLANT
KIT BASIN OR (CUSTOM PROCEDURE TRAY) ×2 IMPLANT
KIT ENCORE 26 ADVANTAGE (KITS) IMPLANT
KIT TURNOVER KIT B (KITS) ×2 IMPLANT
NS IRRIG 1000ML POUR BTL (IV SOLUTION) ×4 IMPLANT
PACK PERIPHERAL VASCULAR (CUSTOM PROCEDURE TRAY) ×2 IMPLANT
PAD ARMBOARD POSITIONER FOAM (MISCELLANEOUS) ×4 IMPLANT
SET MICROPUNCTURE 5F STIFF (MISCELLANEOUS) IMPLANT
SET WALTER ACTIVATION W/DRAPE (SET/KITS/TRAYS/PACK) ×2 IMPLANT
SHEATH FLEXOR INTRODUCER 8FR (SHEATH) IMPLANT
SHEATH PINNACLE 5F 10CM (SHEATH) IMPLANT
STAPLER SKIN PROX 35W (STAPLE) IMPLANT
STENT VIABAHN5X120X8FR 8X (Permanent Stent) IMPLANT
STOCKINETTE IMPERVIOUS LG (DRAPES) IMPLANT
STOPCOCK MORSE 400PSI 3WAY (MISCELLANEOUS) IMPLANT
STRIP CLOSURE SKIN 1/2X4 (GAUZE/BANDAGES/DRESSINGS) ×2 IMPLANT
SUT ETHILON 2 0 PSLX (SUTURE) IMPLANT
SUT ETHILON 3 0 PS 1 (SUTURE) IMPLANT
SUT MNCRL AB 4-0 PS2 18 (SUTURE) ×2 IMPLANT
SUT PROLENE 5 0 C 1 24 (SUTURE) ×2 IMPLANT
SUT PROLENE 6 0 BV (SUTURE) ×2 IMPLANT
SUT VIC AB 2-0 CT1 TAPERPNT 27 (SUTURE) ×2 IMPLANT
SUT VIC AB 3-0 SH 27X BRD (SUTURE) ×2 IMPLANT
SWAB COLLECTION DEVICE MRSA (MISCELLANEOUS) IMPLANT
SYR 10ML LL (SYRINGE) IMPLANT
SYR 20ML LL LF (SYRINGE) IMPLANT
SYR MEDRAD MARK V 150ML (SYRINGE) IMPLANT
TOWEL GREEN STERILE (TOWEL DISPOSABLE) ×4 IMPLANT
TOWEL GREEN STERILE FF (TOWEL DISPOSABLE) ×2 IMPLANT
TUBING INJECTOR 48 (MISCELLANEOUS) IMPLANT
UNDERPAD 30X36 HEAVY ABSORB (UNDERPADS AND DIAPERS) ×2 IMPLANT
WATER STERILE IRR 1000ML POUR (IV SOLUTION) ×2 IMPLANT
WIRE BENTSON .035X145CM (WIRE) IMPLANT

## 2024-03-24 NOTE — Progress Notes (Signed)
 PHARMACY NOTE:  ANTIMICROBIAL RENAL DOSAGE ADJUSTMENT  Current antimicrobial regimen includes a mismatch between antimicrobial dosage and estimated renal function.  As per policy approved by the Pharmacy & Therapeutics and Medical Executive Committees, the antimicrobial dosage will be adjusted accordingly.  Current antimicrobial dosage:  Cefazolin  2g q12h  Indication: Surgical ppx  Renal Function:  Estimated Creatinine Clearance: 100.4 mL/min (by C-G formula based on SCr of 0.68 mg/dL). []      On intermittent HD, scheduled: []      On CRRT    Antimicrobial dosage has been changed to:  Cefazolin  2g q8h     Randy Hebert, PharmD PGY1 Health-System Pharmacy Administration and Leadership Resident [facility]  03/24/2024 6:02 PM

## 2024-03-24 NOTE — Transfer of Care (Signed)
 Immediate Anesthesia Transfer of Care Note  Patient: Randy Hebert  Procedure(s) Performed: LEFT ARCH AORTOGRAM, LEFT UPPER EXTREMITY ANGIOGRAM WITH FIRST ORDER CANNULATION, LEFT AXILLARY ANGIOPLASTY AND STENTING, EVACUATION OF LEFT AXILLARY HEMATOMA (Left: Axilla) ULTRASOUND GUIDANCE, FOR VASCULAR ACCESS (Left: Groin)  Patient Location: PACU  Anesthesia Type:General  Level of Consciousness: drowsy  Airway & Oxygen  Therapy: Pt spon, breathing, fm O2 Post-op Assessment: Report given to RN and Post -op Vital signs reviewed and stable  Post vital signs: Reviewed and stable  Last Vitals:  Vitals Value Taken Time  BP 168/85 03/24/24 13:06  Temp    Pulse 75 03/24/24 13:09  Resp 15 03/24/24 13:09  SpO2 96 % 03/24/24 13:09  Vitals shown include unfiled device data.  Last Pain:  Vitals:   03/24/24 0823  TempSrc:   PainSc: 8          Complications: No notable events documented.

## 2024-03-24 NOTE — ED Provider Notes (Signed)
 Transferred from [facility] for vascular consultation secondary to axillary artery injury.   Physical Exam  BP (!) 135/92   Pulse 94   Temp 97.7 F (36.5 C) (Oral)   Resp 16   SpO2 92%   Physical Exam Musculoskeletal:        General: Swelling (left arm, pulse intact) present.  Skin:    General: Skin is dry.     Comments: Basically the whole left anterior and lateral side of the chest is ecchymotic     Procedures  .Critical Care  Performed by: Randy Mayo, MD Authorized by: Randy Mayo, MD   Critical care provider statement:    Critical care time (minutes):  30   Critical care was necessary to treat or prevent imminent or life-threatening deterioration of the following conditions:  Circulatory failure   Critical care was time spent personally by me on the following activities:  Development of treatment plan with patient or surrogate, discussions with consultants, evaluation of patient's response to treatment, examination of patient, ordering and review of laboratory studies, ordering and review of radiographic studies, ordering and performing treatments and interventions, pulse oximetry, re-evaluation of patient's condition and review of old charts   ED Course / MDM    Medical Decision Making Amount and/or Complexity of Data Reviewed Labs: ordered. Radiology: ordered. ECG/medicine tests: ordered.  Risk Prescription drug management. Decision regarding hospitalization.   D/w Dr. Gretta who contacted Dr. Magda for treatment.        Randy Hebert, Mayo, MD 03/26/24 [phone]

## 2024-03-24 NOTE — Progress Notes (Signed)
 Emptied patients' jp drain and had drainage within an hour. Needing to change the dressing  on the left shoulder. Called to notify Dr. Magda PA. Dr. Fredrich to bedside and assessed patient. Keep the dressing clean per MD.

## 2024-03-24 NOTE — ED Notes (Signed)
 Patient transported to CT

## 2024-03-24 NOTE — ED Notes (Signed)
 Report given to charge RN at [employer], [place].

## 2024-03-24 NOTE — Op Note (Signed)
 DATE OF SERVICE: 03/24/2024  PATIENT:  Randy Hebert  58 y.o. male  PRE-OPERATIVE DIAGNOSIS:  recurrent left axillary pseudoaneurysm  POST-OPERATIVE DIAGNOSIS:  Same  PROCEDURE:   1) ultrasound guided right common femoral artery access (CPT [phone]) 2) arch aortogram (CPT [phone]) 3) Left upper extremity angiogram with first order cannulation (CPT [phone]) 4) Left axillary artery angioplasty and stenting (CPT 37236)  5) Incision and drainage of left axillary hematoma (CPT 10140)  SURGEON:  Surgeons and Role:    * Randy Randy SAILOR, MD - Primary  ASSISTANT: Randy Damme, PA-C  An experienced assistant was required given the complexity of this procedure and the standard of surgical care. My assistant helped with exposure through counter tension, suctioning, ligation and retraction to better visualize the surgical field.  My assistant expedited sewing during the case by following my sutures. Wherever I use the term we in the report, my assistant actively helped me with that portion of the procedure.  ANESTHESIA:   general  EBL: 50mL  BLOOD ADMINISTERED:none  DRAINS: (24F) Jackson-Pratt drain(s) with closed bulb suction in the left axilla   LOCAL MEDICATIONS USED:  NONE  SPECIMEN:  cultures of hematoma  COUNTS: confirmed correct.  TOURNIQUET:  none  PATIENT DISPOSITION:  PACU - hemodynamically stable.   Delay start of Pharmacological VTE agent (>24hrs) due to surgical blood loss or risk of bleeding: no  INDICATION FOR PROCEDURE: Randy Hebert is a 58 y.o. male with left axillary pseudoaneurysm which has recurred after repair 03/12/24. After careful discussion of risks, benefits, and alternatives the patient was offered repair - endovascular vs. open. The patient understood and wished to proceed.  OPERATIVE FINDINGS: right sided arch, with appropriately aberrant arch anatomy. Type 1 arch otherwise. Arteries of arch without stenosis. Normal left subclavian artery. Left axillary  pseudoaneurysm as predicted by CT angiogram. Successful covered stenting of left axillary artery.   DESCRIPTION OF PROCEDURE: After identification of the patient in the pre-operative holding area, the patient was transferred to the operating room. The patient was positioned supine on the operating room table. Anesthesia was induced. The groins and left arm were prepped and draped in standard fashion. A surgical pause was performed confirming correct patient, procedure, and operative location.  Duplex ultrasound was used to guide access in the right common femoral artery, immediately proximal to the common femoral artery bifurcation.  Seldinger technique was used to introduce a 014 wire into the iliac arteries on the right.  Access was upsized to a micro sheath.  Through this sheath a Bentson wire was navigated into the aorta.  Access was then upsized to a 5 [place] sheath.  Patient was systemically heparinized.  A pigtail catheter was advanced over a Bentson wire into the ascending aorta.  Aberrant right sided aortic arch was noted.  Arch angiogram was performed and a RAO projection to delineate the takeoff of the arch vessels.  The left subclavian artery was seen originating from the proximal to heart as is often seen in an aberrant right sided aortic arch.  There is no stenosis in the brachiocephalic vessels noted.  The left subclavian artery was selected with a Berenstein catheter in Glidewire advantage.  After selecting the artery the catheter was advanced into the subclavian artery more distally and selective injection performed.  This identified the axillary pseudoaneurysm well.  Access was then upsized to an 8 [place] by 60 cm Ansell sheath which was delivered into the distal left subclavian artery.  Covered stenting was then performed  of the axillary pseudoaneurysm with an 8 x 50 mm Viabahn stent.  Good technical result is achieved with complete seal on follow-up angiogram.  All the vascular  equipment was removed.  A Perclose device was used to close the arteriotomy in the right common femoral artery with good result.  Palpable pulse was confirmed in the right foot after closure of the common femoral artery.  Prior incisions in the left axillary space were reopened with Metzenbaum scissors.  The incisions appeared well-healed.  There was no evidence of gross infection.  I did take culture samples of the hematoma cavity from both incisions to ensure there is no infection present given the application of the prosthetic stent.  Hematoma was evacuated and the cavity was irrigated copiously.  A counterincision was made in the more distal left arm with an 11 blade and a 15 [place] JP drain was delivered through the tunnel into the wound bed.  This was secured to the skin with a 2-0 nylon suture.  The wounds were copiously irrigated with Vashe.  The wounds were then closed in layers using 3-0 Vicryl, 2-0 nylon, and a skin stapler.  Upon completion of the case instrument and sharps counts were confirmed correct. The patient was transferred to the PACU in good condition. I was present for all portions of the procedure.  FOLLOW UP PLAN: Aspirin /Plavix  for 1 month, followed by indefinite aspirin  use thereafter.  Keep drain today.  Okay to discontinue drain when it makes less than 100 cc / day.  Randy Hebert. Magda, MD [facility] Phone Number: [phone] 03/24/2024 1:04 PM

## 2024-03-24 NOTE — ED Notes (Signed)
 PT was transfer from [facility]. PT brought in by [facility] and stable at this time. PT talking and states he is doing ok at this time VSS.

## 2024-03-24 NOTE — Anesthesia Preprocedure Evaluation (Addendum)
 Anesthesia Evaluation  Patient identified by MRN, date of birth, ID band Patient awake    Reviewed: Allergy  & Precautions, H&P , NPO status , Patient's Chart, lab work & pertinent test results  History of Anesthesia Complications (+) DIFFICULT AIRWAY and history of anesthetic complications (easy VideoGlide with last surgery 03/12/2024)  Airway Mallampati: II   Neck ROM: full    Dental   Pulmonary asthma , sleep apnea, Continuous Positive Airway Pressure Ventilation and Oxygen  sleep apnea , COPD,  COPD inhaler and oxygen  dependent, Current Smoker   breath sounds clear to auscultation       Cardiovascular hypertension, Pt. on medications + CAD, + Past MI, + Cardiac Stents, + Peripheral Vascular Disease and +CHF   Rhythm:regular Rate:Normal  03/03/2024 ECHO: EF 45 to 50%. 1. The LV has mildly decreased function. The left ventricle demonstrates regional wall motion abnormalities (see scoring diagram/findings for description). .   2. No LV mural thrombus noted with Definity  contrast.   3. RVF is normal. The right ventricular size is normal.   4. Left atrial size was mildly dilated.   5. The mitral valve is degenerative. Trivial mitral valve regurgitation.   6. The aortic valve is tricuspid. There is mild calcification of the aortic valve. Aortic valve regurgitation is not visualized. Aortic valve sclerosis is present, with no evidence of aortic valve stenosis.     Neuro/Psych  Headaches  Anxiety Depression    S/p ACDF CVA (L sided weakness), Residual Symptoms    GI/Hepatic ,GERD  Medicated,,(+) Cirrhosis     substance abuse  alcohol  use  Endo/Other  diabetes, Oral Hypoglycemic Agents  BMI 32  Renal/GU      Musculoskeletal  (+) Arthritis , Osteoarthritis,    Abdominal   Peds  Hematology Hb 12.7, plt 362 plavix    Anesthesia Other Findings Had a recent vascular surgery for pseudoaneurysm sustained after shoulder dislocation  (syncopal/fall): now with recurrent pseudoaneurysm L axillary artery   Reproductive/Obstetrics                              Anesthesia Physical Anesthesia Plan  ASA: 4 and emergent  Anesthesia Plan: General   Post-op Pain Management: Minimal or no pain anticipated   Induction: Intravenous and Rapid sequence  PONV Risk Score and Plan: 1 and Ondansetron  and Dexamethasone   Airway Management Planned: Oral ETT and Video Laryngoscope Planned  Additional Equipment: None  Intra-op Plan:   Post-operative Plan: Extubation in OR  Informed Consent: I have reviewed the patients History and Physical, chart, labs and discussed the procedure including the risks, benefits and alternatives for the proposed anesthesia with the patient or authorized representative who has indicated his/her understanding and acceptance.     Dental advisory given  Plan Discussed with: CRNA, Anesthesiologist and Surgeon  Anesthesia Plan Comments:          Anesthesia Quick Evaluation

## 2024-03-24 NOTE — ED Provider Notes (Signed)
 [facility] DEPT [facility] Emergency Department Provider Note MRN:  [id number]  Arrival date & time: 03/24/24     Chief Complaint   Shoulder pain History of Present Illness   Randy Hebert is a 58 y.o. year-old male with a history of CAD presenting to the ED with chief complaint of shoulder pain.  Severe left shoulder pain starting a couple hours ago, not going away.  Had a recent vascular surgery for pseudoaneurysm sustained after shoulder dislocation.  Having trouble moving the left arm since the start of the pain.  Review of Systems  A thorough review of systems was obtained and all systems are negative except as noted in the HPI and PMH.   Patient's Health History    Past Medical History:  Diagnosis Date   ACE inhibitor-aggravated angioedema    Allergy     Anemia    Angio-edema    Anxiety    Arthritis    Asthma    hip replacement   Back pain    Bradycardia 04/28/2017   Bulging of cervical intervertebral disc    CAD (coronary artery disease)    lateral STEMI 02/06/2015 00% D1 occlusion treated with Promus Premier 2.5 mm x 16 mm DES, 70% ramus stenosis, 40% mid RCA stenosis, 45% distal RCA stenosis, EF 45-50%   CHF (congestive heart failure) (HCC)    COPD (chronic obstructive pulmonary disease) (HCC)    Depression    Diabetes mellitus without complication (HCC)    Difficult intubation    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   Dry eye    Dyspnea    Early satiety 09/23/2016   Fatty liver    GERD (gastroesophageal reflux disease)    HCAP (healthcare-associated pneumonia) 05/15/2017   Headache    Heart murmur    Hip pain    Hyperlipidemia    Hypertension    Lobar pneumonia (HCC) 05/15/2017   Melena 08/04/2018   MI (myocardial infarction) (HCC)    Myocardial infarction (HCC)    Neck pain    Non-ST elevation (NSTEMI) myocardial infarction (HCC) 04/27/2017   NSTEMI (non-ST elevated myocardial infarction) (HCC) 04/26/2017   Otitis media     Pleurisy    Pneumonia due to COVID-19 virus 10/07/2019   Rectal bleeding 11/08/2018   Right shoulder pain 03/20/2020   Sinus pause    9 sec sinus pause on telemetry after started on coreg  after MI, avoid AV nodal blocking agent   Sleep apnea    uses a cpap   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   STEMI (ST elevation myocardial infarction) (HCC) 05/31/2015   Substance abuse (HCC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   Syncope 06/29/2017   Syncope and collapse 05/15/2017   Transaminitis 08/04/2018   Unilateral primary osteoarthritis, right hip 07/01/2016    Past Surgical History:  Procedure Laterality Date   ARTERY REPAIR Left 03/11/2024   Procedure: REPAIR LEFT AXILLARY ARTERY;  Surgeon: Serene Gaile ORN, MD;  Location: [facility] OR;  Service: Vascular;  Laterality: Left;  ARTERIAL REPAIR, LEFT AXILLARY   BIOPSY  10/09/2016   Procedure: BIOPSY;  Surgeon: Lamar CHRISTELLA Hollingshead, MD;  Location: [facility] ENDO SUITE;  Service: Endoscopy;;   BIOPSY  11/28/2019   Procedure: BIOPSY;  Surgeon: Hollingshead Lamar CHRISTELLA, MD;  Location: [facility] END[facility];  Service: Endoscopy;;   BIOPSY  06/15/2023   Procedure: BIOPSY;  Surgeon: Hollingshead Lamar CHRISTELLA, MD;  Location: [facility] END[facility];  Service: Endoscopy;;   BRONCHIAL BIOPSY  10/16/2022  Procedure: BRONCHIAL BIOPSIES;  Surgeon: Gladis Leonor HERO, MD;  Location: [facility] ENDOSCOPY;  Service: Pulmonary;;   BRONCHIAL NEEDLE ASPIRATION BIOPSY  10/16/2022   Procedure: BRONCHIAL NEEDLE ASPIRATION BIOPSIES;  Surgeon: Gladis Leonor HERO, MD;  Location: [facility] ENDOSCOPY;  Service: Pulmonary;;   BRONCHIAL WASHINGS  10/16/2022   Procedure: BRONCHIAL WASHINGS;  Surgeon: Gladis Leonor HERO, MD;  Location: [facility] ENDOSCOPY;  Service: Pulmonary;;   CARDIAC CATHETERIZATION N/A 02/06/2015   Procedure: Left Heart Cath and Coronary Angiography;  Surgeon: Alm LELON Clay, MD;  Location: [facility] INVASIVE CV LAB;  Service: Cardiovascular;  Laterality: N/A;   CARDIAC CATHETERIZATION N/A 02/06/2015   Procedure: Coronary Stent  Intervention;  Surgeon: Alm LELON Clay, MD;  Location: [facility] INVASIVE CV LAB;  Service: Cardiovascular;  Laterality: N/A;   COLONOSCOPY WITH PROPOFOL  N/A 10/09/2016   Sigmoid and descending colon diverticulosis, four 4-6 mm polyps in sigmoid, one 4 mm polyp in descending. Tubular adenomas and hyperplastic. 5 year surveillance.    COLONOSCOPY WITH PROPOFOL  N/A 11/24/2016   Sigmoid and descending colon diverticulosis, four 4-6 mm polyps in sigmoid, one 4 mm polyp in descending. Tubular adenomas and hyperplastic. 5 year surveillance.    COLONOSCOPY WITH PROPOFOL  N/A 10/18/2021   Carver: nonbleeding internal hemorrhoids, small and large mouth diverticula found in the sigmoid, descending, transverse colon.  A 9 mm sessile polyp was found in the ascending colon that was removed.  4 sessile polyps found in the sigmoid, descending, transverse colon 3 to 5 mm in size, examining otherwise.  Path revealed tubular adenomas.  Repeat due in 5 years for surveillance.   CORONARY ANGIOPLASTY WITH STENT PLACEMENT  01/2015   CORONARY STENT INTERVENTION N/A 04/27/2017   Procedure: CORONARY STENT INTERVENTION;  Surgeon: Mady Bruckner, MD;  Location: [facility] INVASIVE CV LAB;  Service: Cardiovascular;  Laterality: N/A;   ELECTROPHYSIOLOGY STUDY N/A 06/29/2017   Procedure: ELECTROPHYSIOLOGY STUDY;  Surgeon: Waddell Danelle LELON, MD;  Location: [facility] INVASIVE CV LAB;  Service: Cardiovascular;  Laterality: N/A;   ESOPHAGOGASTRODUODENOSCOPY (EGD) WITH PROPOFOL  N/A 10/09/2016   Dr. Shaaron: LA grade a esophagitis.  Barrett's esophagus, biopsy-proven.  Small hiatal hernia.  EGD February 2019   ESOPHAGOGASTRODUODENOSCOPY (EGD) WITH PROPOFOL  N/A 11/28/2019    salmon-colored esophageal mucosa (Barrett's) small hiatal hernia, portal hypertensive gastropathy, normal duodenum, 3 year surveillance   ESOPHAGOGASTRODUODENOSCOPY (EGD) WITH PROPOFOL  N/A 09/06/2021   three columns of grade 1 varices in distal esophagus, no stigmata of bleeding or red  wale signs. Small hiatal hernia. Mild portal gastropathy. Normal duodenum. Repeat upper endoscopy in 1 year for surveillance.   ESOPHAGOGASTRODUODENOSCOPY (EGD) WITH PROPOFOL  N/A 06/15/2023   Procedure: ESOPHAGOGASTRODUODENOSCOPY (EGD) WITH PROPOFOL ;  Surgeon: Shaaron Lamar HERO, MD;  Location[facility];  Service: Endoscopy;  Laterality: N/A;  830am, asa 3   FIDUCIAL MARKER PLACEMENT  10/16/2022   Procedure: FIDUCIAL MARKER PLACEMENT;  Surgeon: Gladis Leonor HERO, MD;  Location: [facility] ENDOSCOPY;  Service: Pulmonary;;   INCISION / DRAINAGE HAND / FINGER     LEFT HEART CATH AND CORONARY ANGIOGRAPHY N/A 04/27/2017   Procedure: LEFT HEART CATH AND CORONARY ANGIOGRAPHY;  Surgeon: Mady Bruckner, MD;  Location: [facility] INVASIVE CV LAB;  Service: Cardiovascular;  Laterality: N/A;   LOOP RECORDER INSERTION  06/29/2017   Procedure: Loop Recorder Insertion;  Surgeon: Waddell Danelle LELON, MD;  Location: [facility] INVASIVE CV LAB;  Service: Cardiovascular;;   POLYPECTOMY  11/24/2016   Procedure: POLYPECTOMY;  Surgeon: Lamar HERO Shaaron, MD;  [facility];  Service: Endoscopy;;  descending and  sigmoid   POLYPECTOMY  10/18/2021   Procedure: POLYPECTOMY;  Surgeon: Cindie Carlin POUR, DO;  [facility];  Service: Endoscopy;;   TOTAL HIP ARTHROPLASTY Right 07/01/2016   TOTAL HIP ARTHROPLASTY Right 07/01/2016   Procedure: RIGHT TOTAL HIP ARTHROPLASTY ANTERIOR APPROACH;  Surgeon: Lonni CINDERELLA Poli, MD;  Location: [facility] OR;  Service: Orthopedics;  Laterality: Right;    Family History  Problem Relation Age of Onset   Heart attack Father    Stroke Father    Arthritis Father    Heart disease Father    Cancer Mother        ???   Arthritis Mother    Heart disease Brother 56       died in sleep   Early death Brother    Diabetes Maternal Uncle    Alzheimer's disease Maternal Grandmother     Social History   Socioeconomic History   Marital status: Divorced    Spouse name: alice   Number of children: 3    Years of education: 12   Highest education level: Not on file  Occupational History   Occupation: SSI  Tobacco Use   Smoking status: Every Day    Current packs/day: 1.00    Average packs/day: 1 pack/day for 46.0 years (46.0 ttl pk-yrs)    Types: Cigarettes    Passive exposure: Never   Smokeless tobacco: Former    Types: Chew   Tobacco comments:    Smokes 1 pack a day of cigarettes (20 cig)10/16/23  Vaping Use   Vaping status: Former  Substance and Sexual Activity   Alcohol  use: Yes    Comment: rare beer use   Drug use: No    Comment: history of drug use- marijuana, cocaine- quit 2014   Sexual activity: Yes    Partners: Female    Birth control/protection: None  Other Topics Concern   Not on file  Social History Narrative   Lives with girlfriend [name]   3 children, but 1 OD  In 2019.   Grandchildren-2      Two cats: may and princess; and a dog-foxy      Enjoy: spending time with pets, TV, yard work       Diet: eats all food groups   Caffeine: half and half coffee, some tea-half and half decaf   Water : 6-8 cups daily      Wears seat belt   Does not use phone while driving   Smoke detectors at home      Social Drivers of Health   Financial Resource Strain: Low Risk  (01/08/2024)   Overall Financial Resource Strain (CARDIA)    Difficulty of Paying Living Expenses: Not hard at all  Food Insecurity: No Food Insecurity (03/21/2024)   Hunger Vital Sign    Worried About Running Out of Food in the Last Year: Never true    Ran Out of Food in the Last Year: Never true  Transportation Needs: No Transportation Needs (03/21/2024)   PRAPARE - Administrator, Civil Service (Medical): No    Lack of Transportation (Non-Medical): No  Physical Activity: Insufficiently Active (01/08/2024)   Exercise Vital Sign    Days of Exercise per Week: 2 days    Minutes of Exercise per Session: 60 min  Stress: No Stress Concern Present (01/08/2024)   [employer] of Occupational  Health - Occupational Stress Questionnaire    Feeling of Stress : Not at all  Social Connections: Moderately Integrated (03/02/2024)  Social Connection and Isolation Panel    Frequency of Communication with Friends and Family: More than three times a week    Frequency of Social Gatherings with Friends and Family: Once a week    Attends Religious Services: More than 4 times per year    Active Member of [employer] or Organizations: Yes    Attends Engineer, structural: More than 4 times per year    Marital Status: Divorced  Intimate Partner Violence: Not At Risk (03/21/2024)   Humiliation, Afraid, Rape, and Kick questionnaire    Fear of Current or Ex-Partner: No    Emotionally Abused: No    Physically Abused: No    Sexually Abused: No     Physical Exam   Vitals:   03/24/24 0400 03/24/24 0430  BP:    Pulse: 82 88  Resp: (!) 26 (!) 22  Temp: 97.7 F (36.5 C)   SpO2: 93% 100%    CONSTITUTIONAL: Chronically ill-appearing, moderate distress due to pain NEURO/PSYCH:  Alert and oriented x 3, no focal deficits EYES:  eyes equal and reactive ENT/NECK:  no LAD, no JVD CARDIO: Regular rate, well-perfused, normal S1 and S2 PULM:  CTAB no wheezing or rhonchi GI/GU:  non-distended, non-tender MSK/SPINE:  No gross deformities, no edema SKIN: Diffuse bruising to the left arm, left upper chest   *Additional and/or pertinent findings included in MDM below  Diagnostic and Interventional Summary    EKG Interpretation Date/Time:  Thursday March 24 2024 03:54:52 EDT Ventricular Rate:  90 PR Interval:  201 QRS Duration:  156 QT Interval:  419 QTC Calculation: 513 R Axis:   98  Text Interpretation: Sinus rhythm RBBB and LPFB Probable lateral infarct, old Confirmed by Theadore Sharper [id number]) on 03/24/2024 5:22:28 AM       Labs Reviewed  CBC - Abnormal; Notable for the following components:      Result Value   WBC 11.6 (*)    RBC 4.07 (*)    Hemoglobin 12.7 (*)    RDW 16.4 (*)     All other components within normal limits  COMPREHENSIVE METABOLIC PANEL WITH GFR - Abnormal; Notable for the following components:   Potassium 3.4 (*)    CO2 19 (*)    Glucose, Bld 180 (*)    AST 47 (*)    Alkaline Phosphatase 210 (*)    Total Bilirubin 3.0 (*)    Anion gap 16 (*)    All other components within normal limits  TROPONIN I (HIGH SENSITIVITY)    CT ANGIO UP EXTREM LEFT W &/OR WO CONTAST    (Results Pending)  CT Chest W Contrast    (Results Pending)    Medications  HYDROmorphone  (DILAUDID ) injection 1 mg (1 mg Intravenous Given 03/24/24 0350)  ondansetron  (ZOFRAN ) injection 4 mg (4 mg Intravenous Given 03/24/24 0350)  HYDROmorphone  (DILAUDID ) injection 1 mg (1 mg Intravenous Given 03/24/24 0350)  iohexol  (OMNIPAQUE ) 350 MG/ML injection 100 mL (100 mLs Intravenous Contrast Given 03/24/24 0401)     Procedures  /  Critical Care .Critical Care  Performed by: Theadore Sharper HERO, MD Authorized by: Theadore Sharper HERO, MD   Critical care provider statement:    Critical care time (minutes):  55   Critical care was necessary to treat or prevent imminent or life-threatening deterioration of the following conditions: Pseudoaneurysm with active extra.   Critical care was time spent personally by me on the following activities:  Development of treatment plan with patient or surrogate, discussions with  consultants, evaluation of patient's response to treatment, examination of patient, ordering and review of laboratory studies, ordering and review of radiographic studies, ordering and performing treatments and interventions, pulse oximetry, re-evaluation of patient's condition and review of old charts   ED Course and Medical Decision Making  Initial Impression and Ddx Concern for vascular emergency of left upper extremity given patient's recent medical history, pseudoaneurysm.  Does have a strong radial pulse but having trouble moving the arm, has a lot of bruising, has a lot of pain.  Less  likely referred cardiac pain.  Will need repeat CT angio of the upper extremity.  Past medical/surgical history that increases complexity of ED encounter: Recent surgery for pseudoaneurysm  Interpretation of Diagnostics I personally reviewed the EKG and my interpretation is as follows: Sinus rhythm with nonspecific findings  No significant blood count or electrolyte disturbance.  Patient Reassessment and Ultimate Disposition/Management     Active extravasation on the CTA with new pseudoaneurysm.  Case discussed with Dr. Marylouise of vascular surgery, plan is for ER to ER transfer.  Dr. Lorette is excepting ED provider.  Patient management required discussion with the following services or consulting groups: Vascular surgery  Complexity of Problems Addressed Acute illness or injury that poses threat of life of bodily function  Additional Data Reviewed and Analyzed Further history obtained from: Recent discharge summary and Prior labs/imaging results  Additional Factors Impacting ED Encounter Risk Use of parenteral controlled substances and Consideration of hospitalization  Ozell HERO. Theadore, MD [facility] [email or web address]  Final Clinical Impressions(s) / ED Diagnoses     ICD-10-CM   1. Pseudoaneurysm (HCC)  I72.9       ED Discharge Orders     None        Discharge Instructions Discussed with and Provided to Patient:   Discharge Instructions   None      Theadore Ozell HERO, MD 03/24/24 [phone]

## 2024-03-24 NOTE — Consult Note (Signed)
 [facility] [place]  ASSESSMENT / PLAN: 58 y.o. male with recurrence of left axillary pseudoaneurysm after open repair for the same on 03/12/24.  CT angiogram reviewed in detail.  Will repair this in the operating room later today.  Please keep NPO.  Orders for consent written.  CHIEF COMPLAINT: Recurrence of left axillary pseudoaneurysm  HISTORY OF PRESENT ILLNESS: Randy Hebert is a 58 y.o. male with recent left axillary artery pseudoaneurysm which occurred after a fall on 03/11/24. This was repaired through an axillary incision by Dr. Serene on 03/12/24.  He did well postoperatively and was discharged 03/19/2024.  He redeveloped pain late last night, and presented to [facility].  CT scan was performed which showed recurrence of left axillary pseudoaneurysm.  He was transferred hospital hospital for my evaluation.  Patient reports pain in the left shoulder similar to his prior episode.   Past Medical History:  Diagnosis Date   ACE inhibitor-aggravated angioedema    Allergy     Anemia    Angio-edema    Anxiety    Arthritis    Asthma    hip replacement   Back pain    Bradycardia 04/28/2017   Bulging of cervical intervertebral disc    CAD (coronary artery disease)    lateral STEMI 02/06/2015 00% D1 occlusion treated with Promus Premier 2.5 mm x 16 mm DES, 70% ramus stenosis, 40% mid RCA stenosis, 45% distal RCA stenosis, EF 45-50%   CHF (congestive heart failure) (HCC)    COPD (chronic obstructive pulmonary disease) (HCC)    Depression    Diabetes mellitus without complication (HCC)    Difficult intubation    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   Dry eye    Dyspnea    Early satiety 09/23/2016   Fatty liver    GERD (gastroesophageal reflux disease)    HCAP (healthcare-associated pneumonia) 05/15/2017   Headache    Heart murmur    Hip pain    Hyperlipidemia    Hypertension    Lobar pneumonia (HCC) 05/15/2017   Melena 08/04/2018    MI (myocardial infarction) [facility])    Myocardial infarction (HCC)    Neck pain    Non-ST elevation (NSTEMI) myocardial infarction (HCC) 04/27/2017   NSTEMI (non-ST elevated myocardial infarction) (HCC) 04/26/2017   Otitis media    Pleurisy    Pneumonia due to COVID-19 virus 10/07/2019   Rectal bleeding 11/08/2018   Right shoulder pain 03/20/2020   Sinus pause    9 sec sinus pause on telemetry after started on coreg  after MI, avoid AV nodal blocking agent   Sleep apnea    uses a cpap   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   STEMI (ST elevation myocardial infarction) (HCC) 05/31/2015   Substance abuse (HCC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   Syncope 06/29/2017   Syncope and collapse 05/15/2017   Transaminitis 08/04/2018   Unilateral primary osteoarthritis, right hip 07/01/2016    Past Surgical History:  Procedure Laterality Date   ARTERY REPAIR Left 03/11/2024   Procedure: REPAIR LEFT AXILLARY ARTERY;  Surgeon: Serene Gaile ORN, MD;  Location: [facility] OR;  Service: Vascular;  Laterality: Left;  ARTERIAL REPAIR, LEFT AXILLARY   BIOPSY  10/09/2016   Procedure: BIOPSY;  Surgeon: Lamar CHRISTELLA Hollingshead, MD;  Location: [facility] ENDO [facility];  Service: Endoscopy;;   BIOPSY  11/28/2019   Procedure: BIOPSY;  Surgeon: Hollingshead Lamar CHRISTELLA, MD;  [facility]: [facility];  Service: Endoscopy;;  BIOPSY  06/15/2023   Procedure: BIOPSY;  Surgeon: Shaaron Lamar HERO, MD;  [facility];  Service: Endoscopy;;   BRONCHIAL BIOPSY  10/16/2022   Procedure: BRONCHIAL BIOPSIES;  Surgeon: Gladis Leonor HERO, MD;  Location: [facility] ENDOSCOPY;  Service: Pulmonary;;   BRONCHIAL NEEDLE ASPIRATION BIOPSY  10/16/2022   Procedure: BRONCHIAL NEEDLE ASPIRATION BIOPSIES;  Surgeon: Gladis Leonor HERO, MD;  Location: [facility] ENDOSCOPY;  Service: Pulmonary;;   BRONCHIAL WASHINGS  10/16/2022   Procedure: BRONCHIAL WASHINGS;  Surgeon: Gladis Leonor HERO, MD;  Location: [facility] ENDOSCOPY;  Service: Pulmonary;;   CARDIAC CATHETERIZATION N/A 02/06/2015    Procedure: Left Heart Cath and Coronary Angiography;  Surgeon: Alm LELON Clay, MD;  Location: [facility] INVASIVE CV LAB;  Service: Cardiovascular;  Laterality: N/A;   CARDIAC CATHETERIZATION N/A 02/06/2015   Procedure: Coronary Stent Intervention;  Surgeon: Alm LELON Clay, MD;  Location: [facility] INVASIVE CV LAB;  Service: Cardiovascular;  Laterality: N/A;   COLONOSCOPY WITH PROPOFOL  N/A 10/09/2016   Sigmoid and descending colon diverticulosis, four 4-6 mm polyps in sigmoid, one 4 mm polyp in descending. Tubular adenomas and hyperplastic. 5 year surveillance.    COLONOSCOPY WITH PROPOFOL  N/A 11/24/2016   Sigmoid and descending colon diverticulosis, four 4-6 mm polyps in sigmoid, one 4 mm polyp in descending. Tubular adenomas and hyperplastic. 5 year surveillance.    COLONOSCOPY WITH PROPOFOL  N/A 10/18/2021   Carver: nonbleeding internal hemorrhoids, small and large mouth diverticula found in the sigmoid, descending, transverse colon.  A 9 mm sessile polyp was found in the ascending colon that was removed.  4 sessile polyps found in the sigmoid, descending, transverse colon 3 to 5 mm in size, examining otherwise.  Path revealed tubular adenomas.  Repeat due in 5 years for surveillance.   CORONARY ANGIOPLASTY WITH STENT PLACEMENT  01/2015   CORONARY STENT INTERVENTION N/A 04/27/2017   Procedure: CORONARY STENT INTERVENTION;  Surgeon: Mady Bruckner, MD;  Location: [facility] INVASIVE CV LAB;  Service: Cardiovascular;  Laterality: N/A;   ELECTROPHYSIOLOGY STUDY N/A 06/29/2017   Procedure: ELECTROPHYSIOLOGY STUDY;  Surgeon: Waddell Danelle LELON, MD;  Location: [facility] INVASIVE CV LAB;  Service: Cardiovascular;  Laterality: N/A;   ESOPHAGOGASTRODUODENOSCOPY (EGD) WITH PROPOFOL  N/A 10/09/2016   Dr. Shaaron: LA grade a esophagitis.  Barrett's esophagus, biopsy-proven.  Small hiatal hernia.  EGD February 2019   ESOPHAGOGASTRODUODENOSCOPY (EGD) WITH PROPOFOL  N/A 11/28/2019    salmon-colored esophageal mucosa (Barrett's) small hiatal  hernia, portal hypertensive gastropathy, normal duodenum, 3 year surveillance   ESOPHAGOGASTRODUODENOSCOPY (EGD) WITH PROPOFOL  N/A 09/06/2021   three columns of grade 1 varices in distal esophagus, no stigmata of bleeding or red wale signs. Small hiatal hernia. Mild portal gastropathy. Normal duodenum. Repeat upper endoscopy in 1 year for surveillance.   ESOPHAGOGASTRODUODENOSCOPY (EGD) WITH PROPOFOL  N/A 06/15/2023   Procedure: ESOPHAGOGASTRODUODENOSCOPY (EGD) WITH PROPOFOL ;  Surgeon: Shaaron Lamar HERO, MD;  [facility];  Service: Endoscopy;  Laterality: N/A;  830am, asa 3   FIDUCIAL MARKER PLACEMENT  10/16/2022   Procedure: FIDUCIAL MARKER PLACEMENT;  Surgeon: Gladis Leonor HERO, MD;  Location: [facility] ENDOSCOPY;  Service: Pulmonary;;   INCISION / DRAINAGE HAND / FINGER     LEFT HEART CATH AND CORONARY ANGIOGRAPHY N/A 04/27/2017   Procedure: LEFT HEART CATH AND CORONARY ANGIOGRAPHY;  Surgeon: Mady Bruckner, MD;  [facility]: [facility] INVASIVE CV LAB;  Service: Cardiovascular;  Laterality: N/A;   LOOP RECORDER INSERTION  06/29/2017   Procedure: Loop Recorder Insertion;  Surgeon: Waddell Danelle LELON, MD;  Location: [facility] INVASIVE CV LAB;  Service: Cardiovascular;;   POLYPECTOMY  11/24/2016   Procedure: POLYPECTOMY;  Surgeon: Lamar CHRISTELLA Hollingshead, MD;  [facility];  Service: Endoscopy;;  descending and sigmoid   POLYPECTOMY  10/18/2021   Procedure: POLYPECTOMY;  Surgeon: Cindie Carlin POUR, DO;  [facility];  Service: Endoscopy;;   TOTAL HIP ARTHROPLASTY Right 07/01/2016   TOTAL HIP ARTHROPLASTY Right 07/01/2016   Procedure: RIGHT TOTAL HIP ARTHROPLASTY ANTERIOR APPROACH;  Surgeon: Lonni CINDERELLA Poli, MD;  Location: [facility] OR;  Service: Orthopedics;  Laterality: Right;    Family History  Problem Relation Age of Onset   Heart attack Father    Stroke Father    Arthritis Father    Heart disease Father    Cancer Mother        ???   Arthritis Mother    Heart disease Brother 73        died in sleep   Early death Brother    Diabetes Maternal Uncle    Alzheimer's disease Maternal Grandmother     Social History   Socioeconomic History   Marital status: Divorced    Spouse name: alice   Number of children: 3   Years of education: 12   Highest education level: Not on file  Occupational History   Occupation: SSI  Tobacco Use   Smoking status: Every Day    Current packs/day: 1.00    Average packs/day: 1 pack/day for 46.0 years (46.0 ttl pk-yrs)    Types: Cigarettes    Passive exposure: Never   Smokeless tobacco: Former    Types: Chew   Tobacco comments:    Smokes 1 pack a day of cigarettes (20 cig)10/16/23  Vaping Use   Vaping status: Former  Substance and Sexual Activity   Alcohol  use: Yes    Comment: rare beer use   Drug use: No    Comment: history of drug use- marijuana, cocaine- quit 2014   Sexual activity: Yes    Partners: Female    Birth control/protection: None  Other Topics Concern   Not on file  Social History Narrative   Lives with girlfriend [name]   3 children, but 1 OD  In 2019.   Grandchildren-2      Two cats: may and princess; and a dog-foxy      Enjoy: spending time with pets, TV, yard work       Diet: eats all food groups   Caffeine: half and half coffee, some tea-half and half decaf   Water : 6-8 cups daily      Wears seat belt   Does not use phone while driving   Smoke detectors at home      Social Drivers of Health   Financial Resource Strain: Low Risk  (01/08/2024)   Overall Financial Resource Strain (CARDIA)    Difficulty of Paying Living Expenses: Not hard at all  Food Insecurity: No Food Insecurity (03/21/2024)   Hunger Vital Sign    Worried About Running Out of Food in the Last Year: Never true    Ran Out of Food in the Last Year: Never true  Transportation Needs: No Transportation Needs (03/21/2024)   PRAPARE - Administrator, Civil Service (Medical): No    Lack of Transportation (Non-Medical): No  Physical  Activity: Insufficiently Active (01/08/2024)   Exercise Vital Sign    Days of Exercise per Week: 2 days    Minutes of Exercise per Session: 60 min  Stress: No Stress Concern Present (01/08/2024)  [employer] of Occupational Health - Occupational Stress Questionnaire    Feeling of Stress : Not at all  Social Connections: Moderately Integrated (03/02/2024)   Social Connection and Isolation Panel    Frequency of Communication with Friends and Family: More than three times a week    Frequency of Social Gatherings with Friends and Family: Once a week    Attends Religious Services: More than 4 times per year    Active Member of [employer] or Organizations: Yes    Attends Engineer, structural: More than 4 times per year    Marital Status: Divorced  Intimate Partner Violence: Not At Risk (03/21/2024)   Humiliation, Afraid, Rape, and Kick questionnaire    Fear of Current or Ex-Partner: No    Emotionally Abused: No    Physically Abused: No    Sexually Abused: No    Allergies  Allergen Reactions   Coreg  [Carvedilol ] Other (See Comments)    Sinus pause on telemetry >3 seconds. Longest one 9 sec. No AV nodal agent   Zestril  [Lisinopril ] Anaphylaxis, Shortness Of Breath and Swelling    Angioedema, required intubation and mechanical ventilation   Chantix  [Varenicline ] Other (See Comments)    Nightmares Insomnia    Amoxil  [Amoxicillin ] Nausea Only    No current facility-administered medications for this encounter.   Current Outpatient Medications  Medication Sig Dispense Refill   acetaminophen  (TYLENOL ) 500 MG tablet Take 2 tablets (1,000 mg total) by mouth every 8 (eight) hours as needed for mild pain or fever.     amLODipine  (NORVASC ) 10 MG tablet Take 1 tablet (10 mg total) by mouth daily. 90 tablet 1   Ascorbic Acid  (VITAMIN C  PO) Take 1 tablet by mouth daily.     aspirin  EC 81 MG tablet Take 1 tablet (81 mg total) by mouth daily. Swallow whole. 30 tablet 12   atorvastatin   (LIPITOR ) 40 MG tablet Take 1 tablet (40 mg total) by mouth daily. 100 tablet 3   Cholecalciferol  (VITAMIN D -3 PO) Take 1 capsule by mouth daily.     citalopram  (CELEXA ) 20 MG tablet Take 1 tablet (20 mg total) by mouth daily. 90 tablet 1   clopidogrel  (PLAVIX ) 75 MG tablet Take 1 tablet (75 mg total) by mouth daily for 21 days. 21 tablet 0   dapagliflozin  propanediol (FARXIGA ) 10 MG TABS tablet Take 1 tablet (10 mg total) by mouth daily. 90 tablet 0   ezetimibe  (ZETIA ) 10 MG tablet Take 1 tablet (10 mg total) by mouth daily. 90 tablet 1   Ferrous Sulfate  (IRON PO) Take 1 tablet by mouth daily.     furosemide  (LASIX ) 40 MG tablet Take 40 mg by mouth daily as needed (excessive swelling).     glipiZIDE  (GLUCOTROL  XL) 5 MG 24 hr tablet Take 5 mg by mouth daily. (Patient not taking: Reported on 03/21/2024)     MAGNESIUM  PO Take 1 tablet by mouth 2 (two) times daily.     metFORMIN  (GLUCOPHAGE ) 500 MG tablet Take 1 tablet (500 mg total) by mouth 2 (two) times daily with a meal. 180 tablet 0   moxifloxacin (VIGAMOX) 0.5 % ophthalmic solution Place 1 drop into the left eye 3 (three) times daily. 7 day course     naproxen  (NAPROSYN ) 375 MG tablet Take 375 mg by mouth 2 (two) times daily as needed for moderate pain (pain score 4-6).     nitroGLYCERIN  (NITROSTAT ) 0.4 MG SL tablet Place 1 tablet (0.4 mg total) under the tongue every 5 (  five) minutes x 3 doses as needed (if no relief after 3rd dose, proceed to ED or call 911). 25 tablet 3   oxyCODONE -acetaminophen  (PERCOCET/ROXICET) 5-325 MG tablet Take 1 tablet by mouth every 4 (four) hours as needed for moderate pain (pain score 4-6). 20 tablet 0   pantoprazole  (PROTONIX ) 40 MG tablet TAKE (1) TABLET BY MOUTH TWICE DAILY BEFORE A MEAL. 180 tablet 1   RESTASIS  0.05 % ophthalmic emulsion Place 1 drop into both eyes 2 (two) times daily.     SYMBICORT  160-4.5 MCG/ACT inhaler INHALE 2 PUFFS INTO THE LUNGS FIRST THING IN THE MORNING AND 2 PUFFS ABOUT 12 HOURS LATER.  10.2 g 12   TRULICITY  1.5 MG/0.5ML SOAJ Inject 1.5 mg into the skin once a week. 2 mL 1    PHYSICAL EXAM Vitals:   03/24/24 0500 03/24/24 0515 03/24/24 0530 03/24/24 0645  BP:    (!) 135/92  Pulse: (!) 102 (!) 110 (!) 126 94  Resp:    16  Temp:      TempSrc:      SpO2: 94% 94% (!) 81% 92%   Middle-age man in no distress Regular rate and rhythm Unlabored breathing Significant ecchymosis about the left shoulder and chest wall.  Fullness in the left shoulder 2+ left radial pulse  PERTINENT LABORATORY AND RADIOLOGIC DATA  Most recent CBC    Latest Ref Rng & Units 03/24/2024    3:39 AM 03/17/2024    6:07 AM 03/15/2024    6:14 AM  CBC  WBC 4.0 - 10.5 K/uL 11.6  12.7  14.3   Hemoglobin 13.0 - 17.0 g/dL 87.2  88.8  88.8   Hematocrit 39.0 - 52.0 % 39.0  32.7  33.2   Platelets 150 - 400 K/uL 362  252  187      Most recent CMP    Latest Ref Rng & Units 03/24/2024    3:39 AM 03/15/2024    6:14 AM 03/14/2024    6:32 AM  CMP  Glucose 70 - 99 mg/dL 819  842  847   BUN 6 - 20 mg/dL 7  11  9    Creatinine 0.61 - 1.24 mg/dL 9.36  9.33  9.39   Sodium 135 - 145 mmol/L 137  132  134   Potassium 3.5 - 5.1 mmol/L 3.4  3.7  3.8   Chloride 98 - 111 mmol/L 102  97  101   CO2 22 - 32 mmol/L 19  27  24    Calcium  8.9 - 10.3 mg/dL 9.3  9.0  8.6   Total Protein 6.5 - 8.1 g/dL 7.8     Total Bilirubin 0.0 - 1.2 mg/dL 3.0     Alkaline Phos 38 - 126 U/L 210     AST 15 - 41 U/L 47     ALT 0 - 44 U/L 38       Renal function Estimated Creatinine Clearance: 100.5 mL/min (by C-G formula based on SCr of 0.63 mg/dL).  HbA1c, POC (controlled diabetic range) (%)  Date Value  02/02/2024 6.7    LDL Cholesterol (Calc)  Date Value Ref Range Status  06/21/2020 116 (H) mg/dL (calc) Final    Comment:    Reference range: <100 . Desirable range <100 mg/dL for primary prevention;   <70 mg/dL for patients with CHD or diabetic patients  with > or = 2 CHD risk factors. SABRA LDL-C is now calculated using the  Martin-Hopkins  calculation, which is a validated novel method providing  better accuracy than the Friedewald equation in the  estimation of LDL-C.  Gladis APPLETHWAITE et al. SANDREA. 7986;689(80): 2061-2068  (http://education.[employer].com/faq/FAQ164)    LDL Chol Calc (NIH)  Date Value Ref Range Status  12/25/2022 103 (H) 0 - 99 mg/dL Final   LDL Cholesterol  Date Value Ref Range Status  03/03/2024 46 0 - 99 mg/dL Final    Comment:           Total Cholesterol/HDL:CHD Risk Coronary Heart Disease Risk Table                     Men   Women  1/2 Average Risk   3.4   3.3  Average Risk       5.0   4.4  2 X Average Risk   9.6   7.1  3 X Average Risk  23.4   11.0        Use the calculated Patient Ratio above and the CHD Risk Table to determine the patient's CHD Risk.        ATP III CLASSIFICATION (LDL):  <100     mg/dL   Optimal  899-870  mg/dL   Near or Above                    Optimal  130-159  mg/dL   Borderline  839-810  mg/dL   High  >809     mg/dL   Very High Performed at [facility], [address], [place], [place] [address]     CT angiogram of left upper extremity personally reviewed.  There is recurrence of pseudoaneurysm in the left axillary artery.  This appears close to the chest wall, and may be very challenging to access surgically.  An endovascular approach may be favored.  Debby SAILOR. Magda, MD FACS [facility] Phone Number: [phone] 03/24/2024 7:27 AM   Total time spent on preparing this encounter including chart review, data review, collecting history, examining the patient, and coordinating care: 45 minutes  Portions of this report may have been transcribed using voice recognition software.  Every effort has been made to ensure accuracy; however, inadvertent computerized transcription errors may still be present.

## 2024-03-24 NOTE — ED Triage Notes (Signed)
 Pt c/o shoulder pain. Had surgery on 7/11 for left axillary repair. Pt c/o pain and swelling to shoulder.

## 2024-03-24 NOTE — Anesthesia Procedure Notes (Signed)
 Procedure Name: Intubation Date/Time: 03/24/2024 11:08 AM  Performed by: Julien Manus, CRNAPre-anesthesia Checklist: Patient identified, Emergency Drugs available, Suction available and Patient being monitored Patient Re-evaluated:Patient Re-evaluated prior to induction Oxygen  Delivery Method: Circle System Utilized Preoxygenation: Pre-oxygenation with 100% oxygen  Induction Type: IV induction Ventilation: Mask ventilation without difficulty Laryngoscope Size: Glidescope, Mac and 4 Grade View: Grade I Tube type: Oral Tube size: 7.5 mm Number of attempts: 1 Airway Equipment and Method: Stylet and Oral airway Placement Confirmation: ETT inserted through vocal cords under direct vision, positive ETCO2 and breath sounds checked- equal and bilateral Secured at: 23 cm Tube secured with: Tape Dental Injury: Teeth and Oropharynx as per pre-operative assessment

## 2024-03-25 ENCOUNTER — Encounter (HOSPITAL_COMMUNITY)

## 2024-03-25 ENCOUNTER — Encounter (HOSPITAL_COMMUNITY): Payer: Self-pay | Admitting: Vascular Surgery

## 2024-03-25 LAB — GLUCOSE, CAPILLARY
Glucose-Capillary: 156 mg/dL — ABNORMAL HIGH (ref 70–99)
Glucose-Capillary: 284 mg/dL — ABNORMAL HIGH (ref 70–99)
Glucose-Capillary: 117 mg/dL — ABNORMAL HIGH (ref 70–99)
Glucose-Capillary: 185 mg/dL — ABNORMAL HIGH (ref 70–99)

## 2024-03-25 LAB — LIPID PANEL
Cholesterol: 108 mg/dL (ref 0–200)
HDL: 23 mg/dL — ABNORMAL LOW (ref >40)
LDL Cholesterol: 65 mg/dL (ref 0–99)
Total CHOL/HDL Ratio: 4.7 ratio
Triglycerides: 99 mg/dL (ref <150)
VLDL: 20 mg/dL (ref 0–40)

## 2024-03-25 LAB — BASIC METABOLIC PANEL WITH GFR
Anion gap: 8 (ref 5–15)
BUN: 11 mg/dL (ref 6–20)
CO2: 27 mmol/L (ref 22–32)
Calcium: 9 mg/dL (ref 8.9–10.3)
Chloride: 102 mmol/L (ref 98–111)
Creatinine, Ser: 0.6 mg/dL — ABNORMAL LOW (ref 0.61–1.24)
GFR, Estimated: >60 mL/min (ref >60)
Glucose, Bld: 171 mg/dL — ABNORMAL HIGH (ref 70–99)
Potassium: 4 mmol/L (ref 3.5–5.1)
Sodium: 137 mmol/L (ref 135–145)

## 2024-03-25 LAB — CBC
HCT: 29.9 % — ABNORMAL LOW (ref 39.0–52.0)
Hemoglobin: 9.6 g/dL — ABNORMAL LOW (ref 13.0–17.0)
MCH: 30.8 pg (ref 26.0–34.0)
MCHC: 32.1 g/dL (ref 30.0–36.0)
MCV: 95.8 fL (ref 80.0–100.0)
Platelets: 283 K/uL (ref 150–400)
RBC: 3.12 MIL/uL — ABNORMAL LOW (ref 4.22–5.81)
RDW: 16.2 % — ABNORMAL HIGH (ref 11.5–15.5)
WBC: 12.2 K/uL — ABNORMAL HIGH (ref 4.0–10.5)
nRBC: 0.2 % (ref 0.0–0.2)

## 2024-03-25 MED ORDER — ATORVASTATIN CALCIUM 80 MG PO TABS
80.0000 mg | ORAL_TABLET | Freq: Every day | ORAL | Status: DC
  Administered 2024-03-25 – 2024-03-29 (×5): 80 mg via ORAL
  Filled 2024-03-25 (×5): qty 1

## 2024-03-25 MED ORDER — ACETAMINOPHEN 500 MG PO TABS
1000.0000 mg | ORAL_TABLET | Freq: Three times a day (TID) | ORAL | Status: DC
  Administered 2024-03-25 – 2024-03-29 (×12): 1000 mg via ORAL
  Filled 2024-03-25 (×12): qty 2

## 2024-03-25 MED ORDER — FUROSEMIDE 40 MG PO TABS: 40.0000 mg | ORAL_TABLET | Freq: Every day | ORAL | Status: DC | PRN

## 2024-03-25 MED ORDER — METHOCARBAMOL 500 MG PO TABS
1000.0000 mg | ORAL_TABLET | Freq: Three times a day (TID) | ORAL | Status: DC
  Administered 2024-03-25 – 2024-03-29 (×12): 1000 mg via ORAL
  Filled 2024-03-25 (×12): qty 2

## 2024-03-25 MED ORDER — GABAPENTIN 300 MG PO CAPS
300.0000 mg | ORAL_CAPSULE | Freq: Three times a day (TID) | ORAL | Status: DC
  Administered 2024-03-25 – 2024-03-29 (×9): 300 mg via ORAL
  Filled 2024-03-25 (×10): qty 1

## 2024-03-25 MED ORDER — IPRATROPIUM-ALBUTEROL 0.5-2.5 (3) MG/3ML IN SOLN: 3.0000 mL | Freq: Four times a day (QID) | RESPIRATORY_TRACT | Status: DC | PRN

## 2024-03-25 MED ORDER — FLUTICASONE FUROATE-VILANTEROL 200-25 MCG/ACT IN AEPB
1.0000 | INHALATION_SPRAY | Freq: Every day | RESPIRATORY_TRACT | Status: DC
  Administered 2024-03-26 – 2024-03-29 (×4): 1 via RESPIRATORY_TRACT
  Filled 2024-03-25: qty 28

## 2024-03-25 NOTE — Anesthesia Postprocedure Evaluation (Signed)
 Anesthesia Post Note  Patient: Randy Hebert  Procedure(s) Performed: LEFT ARCH AORTOGRAM, LEFT UPPER EXTREMITY ANGIOGRAM WITH FIRST ORDER CANNULATION, LEFT AXILLARY ANGIOPLASTY AND STENTING, EVACUATION OF LEFT AXILLARY HEMATOMA (Left: Axilla) ULTRASOUND GUIDANCE, FOR VASCULAR ACCESS (Left: Groin)     Patient location during evaluation: PACU Anesthesia Type: General Level of consciousness: awake and alert Pain management: pain level controlled Vital Signs Assessment: post-procedure vital signs reviewed and stable Respiratory status: spontaneous breathing, nonlabored ventilation, respiratory function stable and patient connected to nasal cannula oxygen  Cardiovascular status: blood pressure returned to baseline and stable Postop Assessment: no apparent nausea or vomiting Anesthetic complications: no   No notable events documented.  Last Vitals:  Vitals:   03/25/24 0341 03/25/24 0827  BP: 107/71 116/62  Pulse: 72 90  Resp: 20 (!) 22  Temp: 37.1 C 36.6 C  SpO2: 95% 92%    Last Pain:  Vitals:   03/25/24 0928  TempSrc:   PainSc: 9                  Rapheal Masso S

## 2024-03-25 NOTE — Final Consult Note (Signed)
 Initial Consultation Note   Patient: Randy Hebert FMW:[id number] DOB: 06/04/1966 PCP: Bluford Jacqulyn MATSU, DO DOA: 03/24/2024 DOS: the patient was seen and examined on 03/25/2024 Primary service: Magda Debby SAILOR, MD  Referring physician: Lucie Apt, PA-C Reason for consult: Transfer to medical service for ongoing medical issues  Assessment/Plan: Assessment and Plan: 46M h/o diabetes mellitus type 2, hypertension, CAD, liver cirrhosis, anxiety, depression, OSA on CPAP, CVA w/ left-sided weakness discharged 03/03/2024, and recent admission for GLF c/b L shoulder dislocation and traumatic L axillary hematoma s/p pseudoaneurysm repair per vascular surgery on 7/12 and discharged on 7/19 who re-presented on 7/24 w/ L arm pain and CT angio showing recurrence of L axillary pseudoaneurysm s/p L axillary artery angiogram and stenting as well as I&D of L axilla hematoma per vascular surgery on 7/24.  Recurrence of L axillary pseudoaneurysm s/p L axillary artery angiogram and stenting as well as I&D of L axilla hematoma -Vascular surgery following; apprec eval/recs -Multimodal pain control: tylenol  1g TID, robaxin  1g TID, gabapentin 300mg  TID, and oxycodone  5-10mg  q4h prn  H/o HTN -PTA amlodipine  10mg  daily -PTA lasix  40mg  daily  H/o CVA -PTA ASA, plavix  and atorvastatin   H/o DM2 -PTA metformin  500mg  BID -PO gabapentin per above  H/o OSA -CPAP at bedtime   TRH will continue to follow the patient.  HPI: Randy Hebert is a 58 y.o. male with past medical history of diabetes mellitus type 2, hypertension, CAD, liver cirrhosis, anxiety, depression, OSA on CPAP, CVA w/ left-sided weakness discharged 03/03/2024, and recent admission for GLF c/b L shoulder dislocation and traumatic L axillary hematoma s/p pseudoaneurysm repair per vascular surgery on 7/12 and discharged on 7/19 who re-presented on 7/24 w/ L arm pain and CT angio showing recurrence of L axillary pseudoaneurysm s/p L axillary artery  angiogram and stenting as well as I&D of L axilla hematoma per vascular surgery on 7/24..  Review of Systems: As mentioned in the history of present illness. All other systems reviewed and are negative. Past Medical History:  Diagnosis Date   ACE inhibitor-aggravated angioedema    Allergy     Anemia    Angio-edema    Anxiety    Arthritis    Asthma    hip replacement   Back pain    Bradycardia 04/28/2017   Bulging of cervical intervertebral disc    CAD (coronary artery disease)    lateral STEMI 02/06/2015 00% D1 occlusion treated with Promus Premier 2.5 mm x 16 mm DES, 70% ramus stenosis, 40% mid RCA stenosis, 45% distal RCA stenosis, EF 45-50%   CHF (congestive heart failure) (HCC)    COPD (chronic obstructive pulmonary disease) (HCC)    Depression    Diabetes mellitus without complication (HCC)    Difficult intubation    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   Dry eye    Dyspnea    Early satiety 09/23/2016   Fatty liver    GERD (gastroesophageal reflux disease)    HCAP (healthcare-associated pneumonia) 05/15/2017   Headache    Heart murmur    Hip pain    Hyperlipidemia    Hypertension    Lobar pneumonia (HCC) 05/15/2017   Melena 08/04/2018   MI (myocardial infarction) [facility])    Myocardial infarction (HCC)    Neck pain    Non-ST elevation (NSTEMI) myocardial infarction (HCC) 04/27/2017   NSTEMI (non-ST elevated myocardial infarction) (HCC) 04/26/2017   Otitis media    Pleurisy    Pneumonia due to COVID-19 virus  10/07/2019   Rectal bleeding 11/08/2018   Right shoulder pain 03/20/2020   Sinus pause    9 sec sinus pause on telemetry after started on coreg  after MI, avoid AV nodal blocking agent   Sleep apnea    uses a cpap   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   STEMI (ST elevation myocardial infarction) (HCC) 05/31/2015   Substance abuse (HCC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   Syncope 06/29/2017   Syncope and collapse 05/15/2017   Transaminitis  08/04/2018   Unilateral primary osteoarthritis, right hip 07/01/2016   Past Surgical History:  Procedure Laterality Date   ARTERY EXPLORATION Left 03/24/2024   Procedure: LEFT ARCH AORTOGRAM, LEFT UPPER EXTREMITY ANGIOGRAM WITH FIRST ORDER CANNULATION, LEFT AXILLARY ANGIOPLASTY AND STENTING, EVACUATION OF LEFT AXILLARY HEMATOMA;  Surgeon: Magda Debby SAILOR, MD;  Location: [facility] OR;  Service: Vascular;  Laterality: Left;   ARTERY REPAIR Left 03/11/2024   Procedure: REPAIR LEFT AXILLARY ARTERY;  Surgeon: Serene Gaile ORN, MD;  Location: [facility] OR;  Service: Vascular;  Laterality: Left;  ARTERIAL REPAIR, LEFT AXILLARY   BIOPSY  10/09/2016   Procedure: BIOPSY;  Surgeon: Lamar CHRISTELLA Hollingshead, MD;  Location: [facility] ENDO SUITE;  Service: Endoscopy;;   BIOPSY  11/28/2019   Procedure: BIOPSY;  Surgeon: Hollingshead Lamar CHRISTELLA, MD;  Location: [facility];  Service: Endoscopy;;   BIOPSY  06/15/2023   Procedure: BIOPSY;  Surgeon: Hollingshead Lamar CHRISTELLA, MD;  [facility]: [facility];  Service: Endoscopy;;   BRONCHIAL BIOPSY  10/16/2022   Procedure: BRONCHIAL BIOPSIES;  Surgeon: Gladis Leonor CHRISTELLA, MD;  Location: [facility] ENDOSCOPY;  Service: Pulmonary;;   BRONCHIAL NEEDLE ASPIRATION BIOPSY  10/16/2022   Procedure: BRONCHIAL NEEDLE ASPIRATION BIOPSIES;  Surgeon: Gladis Leonor CHRISTELLA, MD;  Location: [facility] ENDOSCOPY;  Service: Pulmonary;;   BRONCHIAL WASHINGS  10/16/2022   Procedure: BRONCHIAL WASHINGS;  Surgeon: Gladis Leonor CHRISTELLA, MD;  Location: [facility] ENDOSCOPY;  Service: Pulmonary;;   CARDIAC CATHETERIZATION N/A 02/06/2015   Procedure: Left Heart Cath and Coronary Angiography;  Surgeon: Alm ORN Clay, MD;  Location: [facility] INVASIVE CV LAB;  Service: Cardiovascular;  Laterality: N/A;   CARDIAC CATHETERIZATION N/A 02/06/2015   Procedure: Coronary Stent Intervention;  Surgeon: Alm ORN Clay, MD;  Location: [facility] INVASIVE CV LAB;  Service: Cardiovascular;  Laterality: N/A;   COLONOSCOPY WITH PROPOFOL  N/A 10/09/2016   Sigmoid and descending colon  diverticulosis, four 4-6 mm polyps in sigmoid, one 4 mm polyp in descending. Tubular adenomas and hyperplastic. 5 year surveillance.    COLONOSCOPY WITH PROPOFOL  N/A 11/24/2016   Sigmoid and descending colon diverticulosis, four 4-6 mm polyps in sigmoid, one 4 mm polyp in descending. Tubular adenomas and hyperplastic. 5 year surveillance.    COLONOSCOPY WITH PROPOFOL  N/A 10/18/2021   Carver: nonbleeding internal hemorrhoids, small and large mouth diverticula found in the sigmoid, descending, transverse colon.  A 9 mm sessile polyp was found in the ascending colon that was removed.  4 sessile polyps found in the sigmoid, descending, transverse colon 3 to 5 mm in size, examining otherwise.  Path revealed tubular adenomas.  Repeat due in 5 years for surveillance.   CORONARY ANGIOPLASTY WITH STENT PLACEMENT  01/2015   CORONARY STENT INTERVENTION N/A 04/27/2017   Procedure: CORONARY STENT INTERVENTION;  Surgeon: Mady Bruckner, MD;  Location: [facility] INVASIVE CV LAB;  Service: Cardiovascular;  Laterality: N/A;   ELECTROPHYSIOLOGY STUDY N/A 06/29/2017   Procedure: ELECTROPHYSIOLOGY STUDY;  Surgeon: Waddell Danelle ORN, MD;  Location: [facility] INVASIVE CV LAB;  Service: Cardiovascular;  Laterality: N/A;   ESOPHAGOGASTRODUODENOSCOPY (EGD) WITH PROPOFOL  N/A 10/09/2016   Dr. Shaaron: LA grade a esophagitis.  Barrett's esophagus, biopsy-proven.  Small hiatal hernia.  EGD February 2019   ESOPHAGOGASTRODUODENOSCOPY (EGD) WITH PROPOFOL  N/A 11/28/2019    salmon-colored esophageal mucosa (Barrett's) small hiatal hernia, portal hypertensive gastropathy, normal duodenum, 3 year surveillance   ESOPHAGOGASTRODUODENOSCOPY (EGD) WITH PROPOFOL  N/A 09/06/2021   three columns of grade 1 varices in distal esophagus, no stigmata of bleeding or red wale signs. Small hiatal hernia. Mild portal gastropathy. Normal duodenum. Repeat upper endoscopy in 1 year for surveillance.   ESOPHAGOGASTRODUODENOSCOPY (EGD) WITH PROPOFOL  N/A 06/15/2023    Procedure: ESOPHAGOGASTRODUODENOSCOPY (EGD) WITH PROPOFOL ;  Surgeon: Shaaron Lamar HERO, MD;  [facility];  Service: Endoscopy;  Laterality: N/A;  830am, asa 3   FIDUCIAL MARKER PLACEMENT  10/16/2022   Procedure: FIDUCIAL MARKER PLACEMENT;  Surgeon: Gladis Leonor HERO, MD;  Location: [facility] ENDOSCOPY;  Service: Pulmonary;;   INCISION / DRAINAGE HAND / FINGER     LEFT HEART CATH AND CORONARY ANGIOGRAPHY N/A 04/27/2017   Procedure: LEFT HEART CATH AND CORONARY ANGIOGRAPHY;  Surgeon: Mady Bruckner, MD;  Location: [facility] INVASIVE CV LAB;  Service: Cardiovascular;  Laterality: N/A;   LOOP RECORDER INSERTION  06/29/2017   Procedure: Loop Recorder Insertion;  Surgeon: Waddell Danelle ORN, MD;  Location: [facility] INVASIVE CV LAB;  Service: Cardiovascular;;   POLYPECTOMY  11/24/2016   Procedure: POLYPECTOMY;  Surgeon: Lamar HERO Shaaron, MD;  [facility];  Service: Endoscopy;;  descending and sigmoid   POLYPECTOMY  10/18/2021   Procedure: POLYPECTOMY;  Surgeon: Cindie Carlin POUR, DO;  [facility]: [facility];  Service: Endoscopy;;   TOTAL HIP ARTHROPLASTY Right 07/01/2016   TOTAL HIP ARTHROPLASTY Right 07/01/2016   Procedure: RIGHT TOTAL HIP ARTHROPLASTY ANTERIOR APPROACH;  Surgeon: Bruckner CINDERELLA Poli, MD;  Location: [place] OR;  Service: Orthopedics;  Laterality: Right;   ULTRASOUND GUIDANCE FOR VASCULAR ACCESS Left 03/24/2024   Procedure: ULTRASOUND GUIDANCE, FOR VASCULAR ACCESS;  Surgeon: Magda Debby SAILOR, MD;  Location: [facility] OR;  Service: Vascular;  Laterality: Left;   Social History:  reports that he has been smoking cigarettes. He has a 46 pack-year smoking history. He has never been exposed to tobacco smoke. He has quit using smokeless tobacco.  His smokeless tobacco use included chew. He reports current alcohol  use. He reports that he does not use drugs.  Allergies  Allergen Reactions   Coreg  [Carvedilol ] Other (See Comments)    Sinus pause on telemetry >3 seconds. Longest one 9 sec. No AV nodal  agent   Zestril  [Lisinopril ] Anaphylaxis, Shortness Of Breath and Swelling    Angioedema, required intubation and mechanical ventilation   Chantix  [Varenicline ] Other (See Comments)    Nightmares Insomnia    Amoxil  [Amoxicillin ] Nausea Only    Family History  Problem Relation Age of Onset   Heart attack Father    Stroke Father    Arthritis Father    Heart disease Father    Cancer Mother        ???   Arthritis Mother    Heart disease Brother 64       died in sleep   Early death Brother    Diabetes Maternal Uncle    Alzheimer's disease Maternal Grandmother     Prior to Admission medications   Medication Sig Start Date End Date Taking? Authorizing Provider  acetaminophen  (TYLENOL ) 500 MG tablet Take 2 tablets (1,000 mg total) by mouth every 8 (eight) hours as  needed for mild pain or fever. 09/06/21  Yes Jens Durand, MD  amLODipine  (NORVASC ) 10 MG tablet Take 1 tablet (10 mg total) by mouth daily. 10/14/23  Yes BranchDorn FALCON, MD  Ascorbic Acid  (VITAMIN C  PO) Take 1 tablet by mouth daily.   Yes [provider]  aspirin  EC 81 MG tablet Take 1 tablet (81 mg total) by mouth daily. Swallow whole. 03/03/24  Yes Shah, Pratik D, DO  atorvastatin  (LIPITOR ) 40 MG tablet Take 1 tablet (40 mg total) by mouth daily. Patient taking differently: Take 40 mg by mouth every evening. 12/09/23  Yes Branch, Dorn FALCON, MD  Cholecalciferol  (VITAMIN D -3 PO) Take 1 capsule by mouth daily.   Yes [provider]  citalopram  (CELEXA ) 20 MG tablet Take 1 tablet (20 mg total) by mouth daily. 01/04/24  Yes Cook, Jayce G, DO  clopidogrel  (PLAVIX ) 75 MG tablet Take 1 tablet (75 mg total) by mouth daily for 21 days. 03/04/24 03/25/24 Yes Shah, Pratik D, DO  dapagliflozin  propanediol (FARXIGA ) 10 MG TABS tablet Take 1 tablet (10 mg total) by mouth daily. 12/10/23  Yes Lenis Ethelle ORN, MD  Ferrous Sulfate  (IRON PO) Take 1 tablet by mouth daily.   Yes [provider]  furosemide  (LASIX ) 40  MG tablet Take 40 mg by mouth daily as needed (excessive swelling).   Yes [provider]  MAGNESIUM  PO Take 1 tablet by mouth 2 (two) times daily.   Yes [provider]  metFORMIN  (GLUCOPHAGE ) 500 MG tablet Take 1 tablet (500 mg total) by mouth 2 (two) times daily with a meal. 10/15/23  Yes Nida, Ethelle ORN, MD  naproxen  (NAPROSYN ) 375 MG tablet Take 375 mg by mouth 2 (two) times daily as needed for moderate pain (pain score 4-6).   Yes [provider]  pantoprazole  (PROTONIX ) 40 MG tablet TAKE (1) TABLET BY MOUTH TWICE DAILY BEFORE A MEAL. 01/11/24  Yes Rourk, Lamar HERO, MD  RESTASIS  0.05 % ophthalmic emulsion Place 1 drop into both eyes 2 (two) times daily. 02/26/24  Yes [provider]  SYMBICORT  160-4.5 MCG/ACT inhaler INHALE 2 PUFFS INTO THE LUNGS FIRST THING IN THE MORNING AND 2 PUFFS ABOUT 12 HOURS LATER. 12/17/23  Yes Cook, Jayce G, DO  TRULICITY  1.5 MG/0.5ML SOAJ Inject 1.5 mg into the skin once a week. 01/18/24  Yes Nida, Gebreselassie W, MD  ezetimibe  (ZETIA ) 10 MG tablet Take 1 tablet (10 mg total) by mouth daily. Patient not taking: Reported on 03/24/2024 10/14/23   Alvan Dorn FALCON, MD  glipiZIDE  (GLUCOTROL  XL) 5 MG 24 hr tablet Take 5 mg by mouth daily. Patient not taking: Reported on 03/21/2024    [provider]  moxifloxacin (VIGAMOX) 0.5 % ophthalmic solution Place 1 drop into the left eye 3 (three) times daily. 7 day course Patient not taking: Reported on 03/24/2024    [provider]  nitroGLYCERIN  (NITROSTAT ) 0.4 MG SL tablet Place 1 tablet (0.4 mg total) under the tongue every 5 (five) minutes x 3 doses as needed (if no relief after 3rd dose, proceed to ED or call 911). 05/18/23   Alvan Dorn FALCON, MD  oxyCODONE -acetaminophen  (PERCOCET/ROXICET) 5-325 MG tablet Take 1 tablet by mouth every 4 (four) hours as needed for moderate pain (pain score 4-6). Patient not taking: Reported on 03/24/2024 03/19/24   Raenelle Coria, MD     Physical Exam: Vitals:   03/24/24 2338 03/25/24 0000 03/25/24 0341 03/25/24 0827  BP: 119/74 113/76 107/71 116/62  Pulse: 80  84 72 90  Resp: 20 14 20  (!) 22  Temp: 98.5 F (36.9 C)  98.7 F (37.1 C) 97.9 F (36.6 C)  TempSrc: Oral  Oral Oral  SpO2: 94%  95% 92%  Weight:      Height:       General: Alert, oriented x3, resting comfortably in no acute distress Respiratory: Lungs clear to auscultation bilaterally with normal respiratory effort; no w/r/r Cardiovascular: Regular rate and rhythm w/o m/r/g   Data Reviewed:   Lab Results  Component Value Date   WBC 12.2 (H) 03/25/2024   HGB 9.6 (L) 03/25/2024   HCT 29.9 (L) 03/25/2024   MCV 95.8 03/25/2024   PLT 283 03/25/2024   Lab Results  Component Value Date   GLUCOSE 171 (H) 03/25/2024   CALCIUM  9.0 03/25/2024   NA 137 03/25/2024   K 4.0 03/25/2024   CO2 27 03/25/2024   CL 102 03/25/2024   BUN 11 03/25/2024   CREATININE 0.60 (L) 03/25/2024   Lab Results  Component Value Date   ALT 38 03/24/2024   AST 47 (H) 03/24/2024   ALKPHOS 210 (H) 03/24/2024   BILITOT 3.0 (H) 03/24/2024   Lab Results  Component Value Date   INR 1.0 03/02/2024   INR 1.1 10/28/2023   INR 1.1 05/05/2023    Radiology:  No results found.   Family Communication: N/A Primary team communication: Called back Lucie Apt  Thank you very much for involving us  in the care of your patient.   ------- I spent 45 minutes reviewing previous labs/notes, obtaining separate history at the bedside, counseling/discussing the treatment plan outlined above, ordering medications/tests, and performing clinical documentation.  Author: Marsha Ada, MD 03/25/2024 12:35 PM  For on call review [email or web address].

## 2024-03-25 NOTE — Progress Notes (Signed)
 PHARMACIST LIPID MONITORING   Randy Hebert is a 58 y.o. male admitted on 03/24/2024 with pseudoaneurysm.   Pharmacy has been consulted to optimize lipid-lowering therapy with the indication of secondary prevention for clinical ASCVD.  Recent Labs:  Lipid Panel (last 6 months):   Lab Results  Component Value Date   CHOL 108 03/25/2024   TRIG 99 03/25/2024   HDL 23 (L) 03/25/2024   CHOLHDL 4.7 03/25/2024   VLDL 20 03/25/2024   LDLCALC 65 03/25/2024    Hepatic function panel (last 6 months):   Lab Results  Component Value Date   AST 47 (H) 03/24/2024   ALT 38 03/24/2024   ALKPHOS 210 (H) 03/24/2024   BILITOT 3.0 (H) 03/24/2024    SCr (since admission):   Serum creatinine: 0.6 mg/dL (L) 92/74/74 9690 Estimated creatinine clearance: 100.4 mL/min (A)  Current therapy and lipid therapy tolerance Current lipid-lowering therapy: Atorvastatin  40 mg daily   Assessment:   Patient agrees with changes to lipid-lowering therapy  Plan:    1.Statin intensity (high intensity recommended for all patients regardless of the LDL):  Add or increase statin to high intensity.  2.Add ezetimibe  (if any one of the following):   Not indicated at this time.  3.Refer to lipid clinic:   No  4.Follow-up with:  Primary care provider - Cook, Jayce G, DO  5.Follow-up labs after discharge:  Changes in lipid therapy were made. Check a lipid panel in 8-12 weeks then annually.       Perri Olam Murray, PharmD 03/25/2024, 7:44 AM

## 2024-03-25 NOTE — Progress Notes (Signed)
   03/25/24 0900  TOC Brief Assessment  Insurance and Status Reviewed  Patient has primary care physician Yes  Home environment has been reviewed home  Prior level of function: independent  Prior/Current Home Services No current home services  Readmission risk has been reviewed Yes  Transition of care needs  --

## 2024-03-25 NOTE — Evaluation (Signed)
 Physical Therapy Evaluation Patient Details Name: Randy Hebert MRN: [id number] DOB: 11/25/1965 Today's Date: 03/25/2024  History of Present Illness  Pt is 58 yo presenting to [facility] on 7/24 due to L arm pain and CT angio showing recurrence of L axillary pseudoaneurysm s/p L axillary artery angiogram and stenting as well as I and D of L axilla hematoma per vascular surgery on 7/24. PMH: Recent CVA (discharged 6/30), DM2, CAD, CHF, COPD, GERD, NSTEMI, MI, L THR  Clinical Impression  Pt is presenting below previous baseline level of functioning. Pt had recent CVA and has pain from previous L THR. Pt was very ind prior to recent multiple hospitalizations. Currently pt is supervision for sit to stand and gait without an AD and has mild balance deficits increasing risk for falls. Due to pt current functional status, home set up and available assistance at home recommending skilled physical therapy services 3x/week in order to address strength, balance and functional mobility to decrease risk for falls, injury and re-hospitalization.           If plan is discharge home, recommend the following: Help with stairs or ramp for entrance;Other (comment) (help as needed)     Equipment Recommendations None recommended by PT     Functional Status Assessment Patient has had a recent decline in their functional status and demonstrates the ability to make significant improvements in function in a reasonable and predictable amount of time.     Precautions / Restrictions Precautions Precautions: Fall Recall of Precautions/Restrictions: Intact Precaution/Restrictions Comments: L shoulder JP drain Restrictions Weight Bearing Restrictions Per Provider Order: Yes LUE Weight Bearing Per Provider Order: Non weight bearing      Mobility  Bed Mobility Overal bed mobility: Modified Independent             General bed mobility comments: slight increase in time. Uses RUE to assist LUE    Transfers Overall  transfer level: Needs assistance Equipment used: None Transfers: Sit to/from Stand Sit to Stand: Supervision           General transfer comment: for safety and to watch LUE    Ambulation/Gait Ambulation/Gait assistance: Supervision Gait Distance (Feet): 300 Feet Assistive device: None Gait Pattern/deviations: Decreased dorsiflexion - right, WFL(Within Functional Limits), Antalgic Gait velocity: slightly decreased Gait velocity interpretation: <1.8 ft/sec, indicate of risk for recurrent falls   General Gait Details: Quick turns, pain in the LUE with increased gait, Supervision for safety     Balance Overall balance assessment: Mild deficits observed, not formally tested       Pertinent Vitals/Pain Pain Assessment Pain Assessment: Faces Faces Pain Scale: Hurts even more Pain Location: L shoulder Pain Descriptors / Indicators: Grimacing, Guarding Pain Intervention(s): Monitored during session, Limited activity within patient's tolerance    Home Living Family/patient expects to be discharged to:: Private residence Living Arrangements: Alone Available Help at Discharge: Family;Available PRN/intermittently;Neighbor (daughters) Type of Home: Apartment Home Access: Elevator       Home Layout: One level Home Equipment: Cane - single point      Prior Function Prior Level of Function : Independent/Modified Independent;Driving             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 ADLs Comments: Independent; drives     Extremity/Trunk Assessment   Upper Extremity Assessment Upper Extremity Assessment: Defer to OT evaluation;LUE deficits/detail    Lower Extremity Assessment Lower Extremity Assessment: Overall WFL for tasks assessed    Cervical / Trunk Assessment Cervical /  Trunk Assessment: Normal  Communication   Communication Communication: No apparent difficulties    Cognition Arousal: Alert Behavior During Therapy: WFL for tasks  assessed/performed   PT - Cognitive impairments: No apparent impairments     Following commands: Intact       Cueing Cueing Techniques: Verbal cues     General Comments General comments (skin integrity, edema, etc.): significant bruising over the L axillary area and anterior/posterior trunk        Assessment/Plan    PT Assessment Patient needs continued PT services  PT Problem List Decreased strength;Decreased activity tolerance;Decreased balance;Decreased mobility;Decreased coordination       PT Treatment Interventions DME instruction;Balance training;Gait training;Functional mobility training;Therapeutic activities;Therapeutic exercise;Patient/family education    PT Goals (Current goals can be found in the Care Plan section)  Acute Rehab PT Goals Patient Stated Goal: return to baseline level of function PT Goal Formulation: With patient Time For Goal Achievement: 04/08/24 Potential to Achieve Goals: Good    Frequency Min 1X/week        AM-PAC PT 6 Clicks Mobility  Outcome Measure Help needed turning from your back to your side while in a flat bed without using bedrails?: None Help needed moving from lying on your back to sitting on the side of a flat bed without using bedrails?: None Help needed moving to and from a bed to a chair (including a wheelchair)?: A Little Help needed standing up from a chair using your arms (e.g., wheelchair or bedside chair)?: A Little Help needed to walk in hospital room?: A Little Help needed climbing 3-5 steps with a railing? : A Little 6 Click Score: 20    End of Session Equipment Utilized During Treatment: Gait belt Activity Tolerance: Patient tolerated treatment well;Patient limited by pain Patient left: in bed;with call bell/phone within reach;with bed alarm set Nurse Communication: Mobility status PT Visit Diagnosis: Unsteadiness on feet (R26.81);Other abnormalities of gait and mobility (R26.89);Muscle weakness  (generalized) (M62.81)    Time: 8577-8554 PT Time Calculation (min) (ACUTE ONLY): 23 min   Charges:   PT Evaluation $PT Eval Low Complexity: 1 Low PT Treatments $Therapeutic Activity: 8-22 mins PT General Charges $$ ACUTE PT VISIT: 1 Visit         Dorothyann Maier, DPT, CLT  Acute Rehabilitation Services Office: [phone] (Secure chat preferred)   Dorothyann VEAR Maier 03/25/2024, 3:07 PM

## 2024-03-25 NOTE — Progress Notes (Addendum)
  Progress Note    03/25/2024 6:38 AM 1 Day Post-Op  Subjective:  feels much better this morning  Afebrile HR 60's-90's 100's-130's systolic 95% 3LO2NC  Vitals:   03/25/24 0000 03/25/24 0341  BP: 113/76 107/71  Pulse: 84 72  Resp: 14 20  Temp:  98.7 F (37.1 C)  SpO2:  95%    Physical Exam: General:  resting comfortably in bed Cardiac:  regular Lungs:  non labored Incisions:  left chest bandage clean Extremities:  + palpable left radial pulse.  Left hand is warm; appears able to move fingers a little better than when I had seen him previously.    CBC    Component Value Date/Time   WBC 12.2 (H) 03/25/2024 0309   RBC 3.12 (L) 03/25/2024 0309   HGB 9.6 (L) 03/25/2024 0309   HGB 17.1 10/28/2023 0809   HCT 29.9 (L) 03/25/2024 0309   HCT 50.7 10/28/2023 0809   PLT 283 03/25/2024 0309   PLT 175 10/28/2023 0809   MCV 95.8 03/25/2024 0309   MCV 91 10/28/2023 0809   MCH 30.8 03/25/2024 0309   MCHC 32.1 03/25/2024 0309   RDW 16.2 (H) 03/25/2024 0309   RDW 12.8 10/28/2023 0809   LYMPHSABS 2.2 03/17/2024 0607   LYMPHSABS 1.6 10/28/2023 0809   MONOABS 1.2 (H) 03/17/2024 0607   EOSABS 0.2 03/17/2024 0607   EOSABS 0.2 10/28/2023 0809   BASOSABS 0.1 03/17/2024 0607   BASOSABS 0.0 10/28/2023 0809    BMET    Component Value Date/Time   NA 137 03/25/2024 0309   NA 138 10/28/2023 0809   K 4.0 03/25/2024 0309   CL 102 03/25/2024 0309   CO2 27 03/25/2024 0309   GLUCOSE 171 (H) 03/25/2024 0309   BUN 11 03/25/2024 0309   BUN 15 10/28/2023 0809   CREATININE 0.60 (L) 03/25/2024 0309   CREATININE 0.68 (L) 09/29/2022 0836   CALCIUM  9.0 03/25/2024 0309   GFRNONAA >60 03/25/2024 0309   GFRNONAA 108 06/21/2020 0707   GFRAA 127 10/24/2020 0811   GFRAA 125 06/21/2020 0707    INR    Component Value Date/Time   INR 1.0 03/02/2024 1920     Intake/Output Summary (Last 24 hours) at 03/25/2024 9361 Last data filed at 03/25/2024 0342 Gross per 24 hour  Intake 120 ml   Output 2995 ml  Net -2875 ml    JP drain output: 200 cc/24 hr  175 cc 1st shift and 25 cc 2nd shift   Assessment/Plan:  58 y.o. male is s/p:  LUE angiogram with angioplasty and stenting of left axillary artery and I&D of left axillary hematoma 03/24/2024 by Dr. Magda  1 Day Post-Op   -pt subjectively feeling better.  He has easily palpable left radial pulse.  JP drain with 200 cc out since surgery but on 25 cc 2nd shift.  Most likely will leave this in place another day and remove tomorrow if continued with decreased drainage.  -DVT prophylaxis:  sq heparin  -mobilize today   Lucie Apt, PA-C [facility] [phone] 03/25/2024 6:38 AM  VASCULAR STAFF ADDENDUM: I have independently interviewed and examined the patient. I agree with the above.  Looks good. Keep arm wrapped for edema. Keep drain another day until <100cc/24h ASA / Plavix  for stent. Change bandages daily and PRN.  Debby SAILOR. Magda, MD [facility] Phone Number: [phone] 03/25/2024 8:39 AM

## 2024-03-26 DIAGNOSIS — J4489 Other specified chronic obstructive pulmonary disease: Secondary | ICD-10-CM

## 2024-03-26 DIAGNOSIS — E785 Hyperlipidemia, unspecified: Secondary | ICD-10-CM

## 2024-03-26 DIAGNOSIS — E66811 Obesity, class 1: Secondary | ICD-10-CM

## 2024-03-26 DIAGNOSIS — I10 Essential (primary) hypertension: Secondary | ICD-10-CM | POA: Diagnosis not present

## 2024-03-26 DIAGNOSIS — I25118 Atherosclerotic heart disease of native coronary artery with other forms of angina pectoris: Secondary | ICD-10-CM

## 2024-03-26 DIAGNOSIS — D62 Acute posthemorrhagic anemia: Secondary | ICD-10-CM

## 2024-03-26 DIAGNOSIS — E876 Hypokalemia: Secondary | ICD-10-CM | POA: Diagnosis not present

## 2024-03-26 DIAGNOSIS — E1169 Type 2 diabetes mellitus with other specified complication: Secondary | ICD-10-CM | POA: Diagnosis not present

## 2024-03-26 DIAGNOSIS — I721 Aneurysm of artery of upper extremity: Secondary | ICD-10-CM

## 2024-03-26 LAB — CBC
HCT: 29.9 % — ABNORMAL LOW (ref 39.0–52.0)
Hemoglobin: 9.8 g/dL — ABNORMAL LOW (ref 13.0–17.0)
MCH: 31.2 pg (ref 26.0–34.0)
MCHC: 32.8 g/dL (ref 30.0–36.0)
MCV: 95.2 fL (ref 80.0–100.0)
Platelets: 282 K/uL (ref 150–400)
RBC: 3.14 MIL/uL — ABNORMAL LOW (ref 4.22–5.81)
RDW: 16.2 % — ABNORMAL HIGH (ref 11.5–15.5)
WBC: 11.1 K/uL — ABNORMAL HIGH (ref 4.0–10.5)
nRBC: 0 % (ref 0.0–0.2)

## 2024-03-26 LAB — GLUCOSE, CAPILLARY
Glucose-Capillary: 161 mg/dL — ABNORMAL HIGH (ref 70–99)
Glucose-Capillary: 155 mg/dL — ABNORMAL HIGH (ref 70–99)
Glucose-Capillary: 119 mg/dL — ABNORMAL HIGH (ref 70–99)
Glucose-Capillary: 155 mg/dL — ABNORMAL HIGH (ref 70–99)
Glucose-Capillary: 183 mg/dL — ABNORMAL HIGH (ref 70–99)

## 2024-03-26 NOTE — Assessment & Plan Note (Signed)
 Patient was placed on insulin  sliding scale for glucose cover and monitoring. Continue with metformin  and SGLT 2 inh.   Continue ezetimibe  and statin

## 2024-03-26 NOTE — Assessment & Plan Note (Signed)
Continue with citalopram 

## 2024-03-26 NOTE — Assessment & Plan Note (Signed)
 Plan to continue with aspirin , clopidogrel  and statin . Wound drain removed 07/26   Follow up on post op care from vascular surgery.  Positive culture from axillary wound (surgical culture), for enterococcus fecalis and constellatus.  Sensitive to ampicillin  and penicillin.  Blood cultures with no growth.  Wbc is 7.0 at the time of discharge and he has been afebrile.   CT abdomen and pelvis with no acute findings in the abdomen or pelvis.  Changes of cirrhosis, with associated splenomegaly. Aortic atherosclerosis.   ID was consulted and recommended antibiotic therapy with Augmentin  for 6 weeks.  Follow up as outpatient.

## 2024-03-26 NOTE — Assessment & Plan Note (Signed)
Continue blood pressure control with amlodipine.  

## 2024-03-26 NOTE — Progress Notes (Signed)
  Progress Note   Patient: Randy Hebert FMW:[id number] DOB: 02/20/1966 DOA: 03/24/2024     2 DOS: the patient was seen and examined on 03/26/2024   Brief hospital course: Randy Hebert was admitted to the hospital with the working diagnosis of recurrent left axillary pseudoaneurysm, sp stenting and hematoma drainage.   75M h/o diabetes mellitus type 2, hypertension, CAD, liver cirrhosis, anxiety, depression, OSA on CPAP, CVA w/ left-sided weakness discharged 03/03/2024, and recent admission for GLF c/b L shoulder dislocation and traumatic L axillary hematoma s/p pseudoaneurysm repair per vascular surgery on 7/12 and discharged on 7/19 who re-presented on 7/24 w/ L arm pain and CT angio showing recurrence of L axillary pseudoaneurysm s/p L axillary artery angiogram and stenting as well as I&D of L axilla hematoma per vascular surgery on 7/24.   07/26 transfer to [facility].   Assessment and Plan: * Aneurysm of axillary artery (HCC) Plan to continue with aspirin , clopidogrel  and statin . Drain to be removed today.  Follow up on post op care from vascular surgery.  Possible discharge home tomorrow if post op pain controlled and stable hgb.   Essential hypertension Continue blood pressure control with amlodipine .    Hypokalemia K has been corrected, Follow up renal function and electrolytes in am.   Type 2 diabetes mellitus with hyperlipidemia (HCC) Continue glucose cover and monitoring with insulin  sliding scale Continue with metformin    CAD (coronary artery disease) Continue aspirin , clopidgrel and statin   Asthmatic bronchitis , chronic (HCC) No signs of acute exacerbation  Acute postoperative anemia due to greater than expected blood loss Hgb today is 9,8, no current indication for PRBC transfusion Check iron stores.   Depression Continue with citalopram   Obesity, class 1 Calculated BMI 31.6      Subjective: Patient is feeling better, no chest pain or dyspnea. Left upper  extremity pain is controlled with analgesics.   Physical Exam: Vitals:   03/26/24 0312 03/26/24 0728 03/26/24 0801 03/26/24 0802  BP: 108/70 117/71  123/74  Pulse: 89   99  Resp: 17 16  17   Temp: 97.7 F (36.5 C) 98.6 F (37 C)  98.7 F (37.1 C)  TempSrc: Oral Oral  Oral  SpO2: 96% 99% 99% 98%  Weight:      Height:       Neurology awake and alert ENT with mild pallor Cardiovascular with S1 and S2 present and regular with no gallops rubs or murmurs Respiratory with no rales or wheezing, no rhonchi  Abdomen with no distention  No lower extremity edema Left upper extremity with local edema, wraps in place.  Positive large chest and abdomen ecchymosis.  Data Reviewed:    Family Communication: no family at the bedside   Disposition: Status is: Inpatient Remains inpatient appropriate because: post op care, follow up on anemia   Planned Discharge Destination: Home    Author: Elidia Toribio Furnace, MD 03/26/2024 11:06 AM  For on call review [email or web address].

## 2024-03-26 NOTE — Assessment & Plan Note (Signed)
 Hgb today is 9.8, no current indication for PRBC transfusion Iron stores with no deficiency.

## 2024-03-26 NOTE — Assessment & Plan Note (Signed)
 Calculated BMI 31.6

## 2024-03-26 NOTE — Progress Notes (Addendum)
  Progress Note    03/26/2024 6:53 AM 2 Days Post-Op  Subjective:  says he continues to feel better.  Says the drain was last emptied yesterday.    afebrile  Vitals:   03/25/24 2348 03/26/24 0312  BP: 125/67 108/70  Pulse: 78 89  Resp: 16 17  Temp: 98.4 F (36.9 C) 97.7 F (36.5 C)  SpO2: 97% 96%    Physical Exam: General:  no distress; resting comfortably Cardiac:  regular Lungs:  non labored Incisions:  left chest incision is clean with staples and nylons in tact with ecchymosis  Extremities:  palpable left radial pulse   CBC    Component Value Date/Time   WBC 12.2 (H) 03/25/2024 0309   RBC 3.12 (L) 03/25/2024 0309   HGB 9.6 (L) 03/25/2024 0309   HGB 17.1 10/28/2023 0809   HCT 29.9 (L) 03/25/2024 0309   HCT 50.7 10/28/2023 0809   PLT 283 03/25/2024 0309   PLT 175 10/28/2023 0809   MCV 95.8 03/25/2024 0309   MCV 91 10/28/2023 0809   MCH 30.8 03/25/2024 0309   MCHC 32.1 03/25/2024 0309   RDW 16.2 (H) 03/25/2024 0309   RDW 12.8 10/28/2023 0809   LYMPHSABS 2.2 03/17/2024 0607   LYMPHSABS 1.6 10/28/2023 0809   MONOABS 1.2 (H) 03/17/2024 0607   EOSABS 0.2 03/17/2024 0607   EOSABS 0.2 10/28/2023 0809   BASOSABS 0.1 03/17/2024 0607   BASOSABS 0.0 10/28/2023 0809    BMET    Component Value Date/Time   NA 137 03/25/2024 0309   NA 138 10/28/2023 0809   K 4.0 03/25/2024 0309   CL 102 03/25/2024 0309   CO2 27 03/25/2024 0309   GLUCOSE 171 (H) 03/25/2024 0309   BUN 11 03/25/2024 0309   BUN 15 10/28/2023 0809   CREATININE 0.60 (L) 03/25/2024 0309   CREATININE 0.68 (L) 09/29/2022 0836   CALCIUM  9.0 03/25/2024 0309   GFRNONAA >60 03/25/2024 0309   GFRNONAA 108 06/21/2020 0707   GFRAA 127 10/24/2020 0811   GFRAA 125 06/21/2020 0707    INR    Component Value Date/Time   INR 1.0 03/02/2024 1920     Intake/Output Summary (Last 24 hours) at 03/26/2024 0653 Last data filed at 03/26/2024 0000 Gross per 24 hour  Intake 540 ml  Output 1340 ml  Net -800  ml      Assessment/Plan:  58 y.o. male is s/p:  LUE angiogram with angioplasty and stenting of left axillary artery and I&D of left axillary hematoma 03/24/2024 by Dr. Magda   2 Days Post-Op   -pt with palpable left radial pulse and incision looks good.  Intra-operative cultures no growth thus far.  -JP drain with decreased drainage, discussed with Dr. Pearline and will remove drain today.  -PT eval and recommended outpatient PT -continue asa/plavix /statin -DVT prophylaxis:  sq heparin  -pt has an appt with [facility] on 7/28.  He should not come to this appt.  We will get him an appt on 8/11 for staple and suture removal.  As long as cultures remain negative, ok to discharge from vascular standpoint.    Lucie Apt, PA-C [facility] [phone] 03/26/2024 6:53 AM   VASCULAR STAFF ADDENDUM: I have independently interviewed and examined the patient. I agree with the above.  Plan to remove JP drain today OK for discharge today from surgical perspective.   Norman GORMAN Pearline MD [facility] Phone Number: [phone] 03/26/2024 9:29 AM

## 2024-03-26 NOTE — Assessment & Plan Note (Signed)
 Stable renal function with serum cr at 0,62 with K at 3,7 and serum bicarbonate at 23  Na 137

## 2024-03-26 NOTE — Hospital Course (Signed)
 Randy Hebert was admitted to the hospital with the working diagnosis of recurrent left axillary pseudoaneurysm, sp stenting and hematoma drainage.   21M h/o diabetes mellitus type 2, hypertension, CAD, liver cirrhosis, anxiety, depression, OSA on CPAP, CVA w/ left-sided weakness discharged 03/03/2024, and recent admission for GLF c/b L shoulder dislocation and traumatic L axillary hematoma s/p pseudoaneurysm repair per vascular surgery on 7/12 and discharged on 7/19 who re-presented on 7/24 w/ L arm pain and CT angio showing recurrence of L axillary pseudoaneurysm s/p L axillary artery angiogram and stenting as well as I&D of L axilla hematoma per vascular surgery on 7/24.   07/26 transfer to [facility].

## 2024-03-26 NOTE — Discharge Instructions (Signed)
 Clean incision with soap and water  daily and pat dry.

## 2024-03-26 NOTE — Evaluation (Signed)
 Occupational Therapy Evaluation Patient Details Name: Randy Hebert MRN: [id number] DOB: 05-09-1966 Today's Date: 03/26/2024   History of Present Illness   Pt is 58 yo presenting to [facility] on 7/24 due to L arm pain and CT angio showing recurrence of L axillary pseudoaneurysm s/p L axillary artery angiogram and stenting as well as I and D of L axilla hematoma per vascular surgery on 7/24. PMH: Recent CVA (discharged 6/30), DM2, CAD, CHF, COPD, GERD, NSTEMI, MI, L THR     Clinical Impressions Pt admitted based on above, and was seen based on problem list below. PTA pt was independent with ADLs and IADLs. Today pt is requiring set up  to min  assist for ADLs. Bed mobility was mod I and functional transfers are  s for safety. Noted increased edema in L hand and bruising along L axillary site. Performed retrograde massage to exposed digits and hand. Elevated LUE and provided pt with yellow foam squeeze to improve AROM in digits. Anticipate pt will progress well, recommending outpatient OT services to improve ROM, strength, and FMC. OT will continue to follow acutely to maximize functional independence.        If plan is discharge home, recommend the following:   Assistance with cooking/housework;Assist for transportation     Functional Status Assessment   Patient has had a recent decline in their functional status and demonstrates the ability to make significant improvements in function in a reasonable and predictable amount of time.     Equipment Recommendations   None recommended by OT      Precautions/Restrictions   Precautions Precautions: Fall Recall of Precautions/Restrictions: Intact Precaution/Restrictions Comments: L shoulder JP drain Restrictions Weight Bearing Restrictions Per Provider Order: Yes LUE Weight Bearing Per Provider Order: Non weight bearing     Mobility Bed Mobility Overal bed mobility: Modified Independent             General bed mobility  comments: slight increase in time. Uses RUE to assist LUE    Transfers Overall transfer level: Needs assistance Equipment used: None Transfers: Sit to/from Stand, Bed to chair/wheelchair/BSC Sit to Stand: Supervision     Step pivot transfers: Supervision     General transfer comment: S for safety, good protection of LUE      Balance Overall balance assessment: Mild deficits observed, not formally tested       ADL either performed or assessed with clinical judgement   ADL Overall ADL's : Needs assistance/impaired;At baseline Eating/Feeding: Set up;Sitting   Grooming: Set up;Standing Grooming Details (indicate cue type and reason): Assist for managing containers         Upper Body Dressing : Minimal assistance;Sitting   Lower Body Dressing: Minimal assistance;Sit to/from stand Lower Body Dressing Details (indicate cue type and reason): Assist to thread legs Toilet Transfer: Supervision/safety;Ambulation;BSC/3in1 Statistician Details (indicate cue type and reason): S for safety to [facility] no AD Toileting- Clothing Manipulation and Hygiene: Contact guard assist;Sit to/from stand Toileting - Clothing Manipulation Details (indicate cue type and reason): To wipe from a squatted position     Functional mobility during ADLs: Supervision/safety General ADL Comments: min assist for dressing/bathing d/t LUE edema and NWB status     Vision Baseline Vision/History: 1 Wears glasses;4 Cataracts Patient Visual Report: No change from baseline Vision Assessment?: No apparent visual deficits            Pertinent Vitals/Pain Pain Assessment Pain Assessment: Faces Faces Pain Scale: Hurts little more Pain Location: L shoulder Pain Descriptors /  Indicators: Grimacing, Guarding Pain Intervention(s): Monitored during session     Extremity/Trunk Assessment Upper Extremity Assessment Upper Extremity Assessment: LUE deficits/detail LUE Deficits / Details: Edema in L hand, decreased  AROM in all LUE joints, hx of CVA LUE Sensation: history of peripheral neuropathy LUE Coordination: decreased fine motor;decreased gross motor   Lower Extremity Assessment Lower Extremity Assessment: Defer to PT evaluation   Cervical / Trunk Assessment Cervical / Trunk Assessment: Normal   Communication Communication Communication: No apparent difficulties   Cognition Arousal: Alert Behavior During Therapy: WFL for tasks assessed/performed Cognition: No apparent impairments     Following commands: Intact       Cueing  General Comments   Cueing Techniques: Verbal cues  Significant bruising on lower back, pt reporting nopain           Home Living Family/patient expects to be discharged to:: Private residence Living Arrangements: Alone Available Help at Discharge: Family;Available PRN/intermittently;Neighbor Type of Home: Apartment Home Access: Elevator     Home Layout: One level     Bathroom Shower/Tub: Chief Strategy Officer: Standard Bathroom Accessibility: Yes How Accessible: Accessible via wheelchair;Accessible via [place] Home Equipment: Cane - single point          Prior Functioning/Environment Prior Level of Function : Independent/Modified Independent;Driving     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 ADLs Comments: Independent; drives    OT Problem List: Decreased strength;Decreased range of motion;Decreased coordination;Impaired UE functional use;Pain;Increased edema   OT Treatment/Interventions: Self-care/ADL training;Therapeutic exercise;Energy conservation;DME and/or AE instruction;Therapeutic activities;Manual therapy;Patient/family education      OT Goals(Current goals can be found in the care plan section)   Acute Rehab OT Goals Patient Stated Goal: To get better OT Goal Formulation: With patient Time For Goal Achievement: 03/28/24 Potential to Achieve Goals: Good   OT Frequency:  Min 2X/week        AM-PAC OT 6 Clicks Daily Activity     Outcome Measure Help from another person eating meals?: A Little Help from another person taking care of personal grooming?: A Little Help from another person toileting, which includes using toliet, bedpan, or urinal?: A Little Help from another person bathing (including washing, rinsing, drying)?: A Little Help from another person to put on and taking off regular upper body clothing?: A Little Help from another person to put on and taking off regular lower body clothing?: A Little 6 Click Score: 18   End of Session Nurse Communication: Mobility status  Activity Tolerance: Patient tolerated treatment well Patient left: in bed;with call bell/phone within reach;with bed alarm set  OT Visit Diagnosis: Muscle weakness (generalized) (M62.81);Pain Pain - Right/Left: Left Pain - part of body: Shoulder                Time: 9160-9140 OT Time Calculation (min): 20 min Charges:  OT General Charges $OT Visit: 1 Visit OT Evaluation $OT Eval Moderate Complexity: 1 Mod  Adrianne BROCKS, OT  Acute Rehabilitation Services Office [phone] Secure chat preferred   Adrianne GORMAN Savers 03/26/2024, 9:36 AM

## 2024-03-26 NOTE — Progress Notes (Signed)
 Mobility Specialist Progress Note:    03/26/24 1101  Mobility  Activity Ambulated independently in room;Ambulated independently in hallway  Level of Assistance Independent  Assistive Device None  Distance Ambulated (ft) 400 ft  LUE Weight Bearing Per Provider Order NWB  Activity Response Tolerated well  Mobility Referral Yes  Mobility visit 1 Mobility  Mobility Specialist Start Time (ACUTE ONLY) 1101  Mobility Specialist Stop Time (ACUTE ONLY) 1108  Mobility Specialist Time Calculation (min) (ACUTE ONLY) 7 min   Pt received dangling EOB, agreeable to mobility session. Ambulated in hallway independently. Max HR 150's. RN notified. Returned pt to bed, all needs met.    Airi Copado Mobility Specialist Please contact via Special educational needs teacher or  Rehab office at [phone]

## 2024-03-26 NOTE — Progress Notes (Signed)
 Removed JP drain per order. Patient tolerated well.  Anet Logsdon L Hallelujah Wysong, RN

## 2024-03-26 NOTE — Assessment & Plan Note (Signed)
 Continue aspirin , clopidgrel and statin

## 2024-03-26 NOTE — Assessment & Plan Note (Signed)
Old records personally reviewed, neurology follow up from 06/2021. Seropositive ocular myasthenia gravis, no significant bulbar, or limb weakness noted.  He is not longer on Mestinon or prednisone, never treated with long term steroids sparing agent.   

## 2024-03-27 DIAGNOSIS — I10 Essential (primary) hypertension: Secondary | ICD-10-CM | POA: Diagnosis not present

## 2024-03-27 DIAGNOSIS — F32A Depression, unspecified: Secondary | ICD-10-CM

## 2024-03-27 DIAGNOSIS — E876 Hypokalemia: Secondary | ICD-10-CM | POA: Diagnosis not present

## 2024-03-27 DIAGNOSIS — E1169 Type 2 diabetes mellitus with other specified complication: Secondary | ICD-10-CM | POA: Diagnosis not present

## 2024-03-27 DIAGNOSIS — I721 Aneurysm of artery of upper extremity: Secondary | ICD-10-CM | POA: Diagnosis not present

## 2024-03-27 DIAGNOSIS — E66811 Obesity, class 1: Secondary | ICD-10-CM

## 2024-03-27 LAB — CBC
HCT: 29.5 % — ABNORMAL LOW (ref 39.0–52.0)
Hemoglobin: 9.5 g/dL — ABNORMAL LOW (ref 13.0–17.0)
MCH: 30.5 pg (ref 26.0–34.0)
MCHC: 32.2 g/dL (ref 30.0–36.0)
MCV: 94.9 fL (ref 80.0–100.0)
Platelets: 259 K/uL (ref 150–400)
RBC: 3.11 MIL/uL — ABNORMAL LOW (ref 4.22–5.81)
RDW: 16.2 % — ABNORMAL HIGH (ref 11.5–15.5)
WBC: 8.2 K/uL (ref 4.0–10.5)
nRBC: 0 % (ref 0.0–0.2)

## 2024-03-27 LAB — BASIC METABOLIC PANEL WITH GFR
Anion gap: 11 (ref 5–15)
BUN: 10 mg/dL (ref 6–20)
CO2: 24 mmol/L (ref 22–32)
Calcium: 8.8 mg/dL — ABNORMAL LOW (ref 8.9–10.3)
Chloride: 103 mmol/L (ref 98–111)
Creatinine, Ser: 0.69 mg/dL (ref 0.61–1.24)
GFR, Estimated: >60 mL/min (ref >60)
Glucose, Bld: 127 mg/dL — ABNORMAL HIGH (ref 70–99)
Potassium: 3.4 mmol/L — ABNORMAL LOW (ref 3.5–5.1)
Sodium: 138 mmol/L (ref 135–145)

## 2024-03-27 LAB — GLUCOSE, CAPILLARY
Glucose-Capillary: 121 mg/dL — ABNORMAL HIGH (ref 70–99)
Glucose-Capillary: 157 mg/dL — ABNORMAL HIGH (ref 70–99)
Glucose-Capillary: 126 mg/dL — ABNORMAL HIGH (ref 70–99)
Glucose-Capillary: 133 mg/dL — ABNORMAL HIGH (ref 70–99)

## 2024-03-27 MED ORDER — ENOXAPARIN SODIUM 40 MG/0.4ML IJ SOSY
40.0000 mg | PREFILLED_SYRINGE | Freq: Every day | INTRAMUSCULAR | Status: DC
  Administered 2024-03-27 – 2024-03-28 (×2): 40 mg via SUBCUTANEOUS
  Filled 2024-03-27 (×2): qty 0.4

## 2024-03-27 MED ORDER — POTASSIUM CHLORIDE CRYS ER 20 MEQ PO TBCR
40.0000 meq | EXTENDED_RELEASE_TABLET | Freq: Once | ORAL | Status: AC
  Administered 2024-03-27: 40 meq via ORAL
  Filled 2024-03-27: qty 2

## 2024-03-27 MED ORDER — PIPERACILLIN-TAZOBACTAM 3.375 G IVPB 30 MIN
3.3750 g | Freq: Three times a day (TID) | INTRAVENOUS | Status: DC
  Administered 2024-03-27: 3.375 g via INTRAVENOUS
  Filled 2024-03-27: qty 50

## 2024-03-27 MED ORDER — PIPERACILLIN-TAZOBACTAM 3.375 G IVPB 30 MIN: 3.3750 g | Freq: Four times a day (QID) | INTRAVENOUS | Status: DC

## 2024-03-27 MED ORDER — AMOXICILLIN 500 MG PO CAPS
500.0000 mg | ORAL_CAPSULE | Freq: Three times a day (TID) | ORAL | Status: DC
  Administered 2024-03-27 (×2): 500 mg via ORAL
  Filled 2024-03-27 (×4): qty 1

## 2024-03-27 NOTE — Progress Notes (Signed)
 Mobility Specialist Progress Note:   03/27/24 0931  Mobility  Activity Ambulated independently in room;Ambulated independently in hallway  Level of Assistance Independent after set-up  Assistive Device None  Distance Ambulated (ft) 400 ft  LUE Weight Bearing Per Provider Order NWB  Activity Response Tolerated well  Mobility Referral Yes  Mobility visit 1 Mobility  Mobility Specialist Start Time (ACUTE ONLY) 0931  Mobility Specialist Stop Time (ACUTE ONLY) [id number]  Mobility Specialist Time Calculation (min) (ACUTE ONLY) 7 min   Pt received in bed, agreeable to mobility session. Ambulated in hallway independently after set-up. Max HR 117 bpm. Returned pt to room, sitting up in bed, all needs met.    Ikey Omary Mobility Specialist Please contact via Special educational needs teacher or  Rehab office at [phone]

## 2024-03-27 NOTE — Progress Notes (Addendum)
 Progress Note    03/27/2024 9:00 AM 3 Days Post-Op  Subjective:  no complaints  afebrile  Vitals:   03/27/24 0525 03/27/24 0824  BP: (!) 107/53 130/70  Pulse: 84 92  Resp: 14 18  Temp: 98.3 F (36.8 C) 98.6 F (37 C)  SpO2: 95%     Physical Exam: General:  no distress Cardiac:  regular Lungs:  non labored Incisions:  left chest and left axillary incisions are clean and intact Extremities:  palpable left radial pulse   CBC    Component Value Date/Time   WBC 8.2 03/27/2024 0356   RBC 3.11 (L) 03/27/2024 0356   HGB 9.5 (L) 03/27/2024 0356   HGB 17.1 10/28/2023 0809   HCT 29.5 (L) 03/27/2024 0356   HCT 50.7 10/28/2023 0809   PLT 259 03/27/2024 0356   PLT 175 10/28/2023 0809   MCV 94.9 03/27/2024 0356   MCV 91 10/28/2023 0809   MCH 30.5 03/27/2024 0356   MCHC 32.2 03/27/2024 0356   RDW 16.2 (H) 03/27/2024 0356   RDW 12.8 10/28/2023 0809   LYMPHSABS 2.2 03/17/2024 0607   LYMPHSABS 1.6 10/28/2023 0809   MONOABS 1.2 (H) 03/17/2024 0607   EOSABS 0.2 03/17/2024 0607   EOSABS 0.2 10/28/2023 0809   BASOSABS 0.1 03/17/2024 0607   BASOSABS 0.0 10/28/2023 0809    BMET    Component Value Date/Time   NA 138 03/27/2024 0356   NA 138 10/28/2023 0809   K 3.4 (L) 03/27/2024 0356   CL 103 03/27/2024 0356   CO2 24 03/27/2024 0356   GLUCOSE 127 (H) 03/27/2024 0356   BUN 10 03/27/2024 0356   BUN 15 10/28/2023 0809   CREATININE 0.69 03/27/2024 0356   CREATININE 0.68 (L) 09/29/2022 0836   CALCIUM  8.8 (L) 03/27/2024 0356   GFRNONAA >60 03/27/2024 0356   GFRNONAA 108 06/21/2020 0707   GFRAA 127 10/24/2020 0811   GFRAA 125 06/21/2020 0707    INR    Component Value Date/Time   INR 1.0 03/02/2024 1920     Intake/Output Summary (Last 24 hours) at 03/27/2024 0900 Last data filed at 03/27/2024 0615 Gross per 24 hour  Intake 340 ml  Output 2760 ml  Net -2420 ml       Component Ref Range & Units (hover) 3 d ago  Specimen Description WOUND  Special Requests  LEFT INTRACLAVICULAR  Gram Stain NO WBC SEEN NO ORGANISMS SEEN Performed at [facility], [address] [place], [place] [address]  Culture RARE ENTEROCOCCUS FAECALIS SUSCEPTIBILITIES TO FOLLOW NO ANAEROBES ISOLATED; CULTURE IN PROGRESS FOR 5 DAYS  Report Status PENDING         Component Ref Range & Units (hover) 3 d ago  Specimen Description WOUND  Special Requests LEFT AXILLA  Gram Stain NO WBC SEEN NO ORGANISMS SEEN Performed at [facility], [address], [place], [place] [address]  Culture CULTURE REINCUBATED FOR BETTER GROWTH NO ANAEROBES ISOLATED; CULTURE IN PROGRESS FOR 5 DAYS  Report Status PENDING   Assessment/Plan:  58 y.o. male is s/p:  LUE angiogram with angioplasty and stenting of left axillary artery and I&D of left axillary hematoma 03/24/2024 by Dr. Magda   3 Days Post-Op   -pt continues to have palpable left radial pulse.  -left intra-clavicular wound cx with rare enterococcus faecalis - will order blood cx x 2 and start Zosyn .  So far, left axilla wound cx no growth but has been re-incubated for better growth.   -needs  to remain hospitalized until blood cultures result.   -hgb down slightly to 9.5 from 9.8 but stable.     Lucie Apt, PA-C [facility] [phone] 03/27/2024 9:00 AM  VASCULAR STAFF ADDENDUM: I have independently interviewed and examined the patient. I agree with the above.  Given recent growth on cultures we discussed holding off on discharge and staring IV abx and obtain blood cultures. The incisions do not appear infected as there is no drainage or erythema but with discuss with Dr Magda regarding future plans.   Norman GORMAN Serve MD [facility] Phone Number: [phone] 03/27/2024 9:00 AM

## 2024-03-27 NOTE — Progress Notes (Signed)
 Progress Note   Patient: Randy Hebert FMW:[id number] DOB: Sep 15, 1965 DOA: 03/24/2024     3 DOS: the patient was seen and examined on 03/27/2024   Brief hospital course: Mr. Echeverri was admitted to the hospital with the working diagnosis of recurrent left axillary pseudoaneurysm, requiring stenting and hematoma drainage.   58 yo male with past medical history of diabetes mellitus type 2, hypertension, CAD, liver cirrhosis, anxiety, depression, OSA, CVA w/ left-sided weakness who had a recent hospitalization 7/11 to 03/19/24 post traumatic left shoulder dislocation, complicated with left axillary hematoma with left axillary pseudoaneurysm. On 07/12 he underwent open exposure and repair of the left axillary pseudoaneurysm and evacuation of left axillary hematoma.  07/24 [facility] with acute worsening left axillary pain along with decreased left upper extremity mobility due to pain. His blood pressure was 135/92, HR 106, RR 16 and 02 saturation 92%.  Lungs with no wheezing or rales, heart with S1 and S2 present and regular, abdomen with no distention and no lower extremity edema.  Left upper extremity with edema.  Positive large ecchymosis upper chest and left upper extremity.   Na 137, K 3,4 Cl 102 bicarbonate 19 glucose 180 bun 7 cr 0,63  AST 47 ALT 38  High sensitive troponin 14 and 11  Wbc 11,6 hgb 12,7 plt 362   Ct angiography left upper extremity with 1,8 to 1.5 cm pseudoaneurysm arising from the posterior wall of the left axillary artery, with a 1,3 cm length contrast tract from the posterior wall into the pseudoaneurysm.  1,8 to 1.1 cm contrast pocket in the posterior aspect of the hematoma.  0.1 to 6.9 x 9.7 hematoma in the left axillary space,  5.6 to 3.9 cm hematoma in the left subscapularis space.  5.1 to 3,8 hematoma in the left triceps muscle.  Non displaced recent fractures of the anterolateral left sixth, seven and eights ribs.   CT chest with enlarging left  axillary hematoma with arterial contrast extravasation from the posterior wall of thel left axillary artery into a 1.5 to 1,8 cm pseudoaneurysm, with additional 1,8 to 1,1 cm pocket of contrast more posteriorly within the collection.   EKG 98 bpm, right axis deviation, right bundle branch block, qtc 513, sinus rhythm with 1st degree AV block, no significant ST segment or T wave changes.   Patient was transferred to [facility] under vascular surgery service.   07/24 patient underwent: 1) ultrasound guided right common femoral artery access  2) arch aortogram 3) Left upper extremity angiogram with first order cannulation  4) Left axillary artery angioplasty and stenting 5) Incision and drainage of left axillary hematoma   07/26 transfer to [facility]. Drain was removed.  07/27 hgb stable with no indication for PRBC transfusion, pain controlled with analgesics.  Left axillary wound culture positive for enterococcus fecalis, started on antibiotic therapy with IV Zosyn .   Assessment and Plan: * Aneurysm of axillary artery (HCC) Plan to continue with aspirin , clopidogrel  and statin . Drain to be removed 07/26   Follow up on post op care from vascular surgery.  Positive culture from axillary wound (surgical culture), for enterococcus fecalis, started on Zosyn  and will follow up sensitivities.  Blood cultures today  Wbc is 8,2 and he has been afebrile.   Essential hypertension Continue blood pressure control with amlodipine .    Hypokalemia Renal function with serum cr at 0,69, K is 3,4 and serum bicarbonate at 24 Na 138   Add 40 meq Kcl and follow  up electrolytes in am.   Type 2 diabetes mellitus with hyperlipidemia (HCC) Continue glucose cover and monitoring with insulin  sliding scale Continue with metformin  and SGLT 2 inh.   Continue ezetimibe  and statin   CAD (coronary artery disease) Continue aspirin , clopidgrel and statin   Asthmatic bronchitis , chronic (HCC) No signs of acute  exacerbation  Acute postoperative anemia due to greater than expected blood loss Hgb today is 9.5, no current indication for PRBC transfusion Check iron stores.   Depression Continue with citalopram   Obesity, class 1 Calculated BMI 31.6       Subjective: Patient with left arm pain controlled with analgesics, with no chest pain, or dyspnea.   Physical Exam: Vitals:   03/27/24 0116 03/27/24 0525 03/27/24 0750 03/27/24 0824  BP: 111/67 (!) 107/53  130/70  Pulse: 93 84  92  Resp: 13 14  18   Temp: 98 F (36.7 C) 98.3 F (36.8 C)  98.6 F (37 C)  TempSrc: Oral Oral  Oral  SpO2: 93% 95% 95%   Weight:      Height:       Neurology awake and alert ENT with mild pallor Cardiovascular with S1 and S2 present and regular with no gallops, rubs or murmurs Respiratory with no rales or wheezing, no rhonchi Abdomen with no distention  No lower extremity edema Left upper extremity with moderate non pitting edema, left axillary wound with no drainage.  Positive chest and abdomen ecchymosis.  Data Reviewed:   Family Communication: no family at the bedside   Disposition: Status is: Inpatient Remains inpatient appropriate because: IV antibiotics   Planned Discharge Destination: Home    Author: Elidia Toribio Furnace, MD 03/27/2024 10:06 AM  For on call review [email or web address].

## 2024-03-28 ENCOUNTER — Ambulatory Visit

## 2024-03-28 ENCOUNTER — Inpatient Hospital Stay (HOSPITAL_COMMUNITY)

## 2024-03-28 ENCOUNTER — Telehealth: Admitting: *Deleted

## 2024-03-28 DIAGNOSIS — I10 Essential (primary) hypertension: Secondary | ICD-10-CM | POA: Diagnosis not present

## 2024-03-28 DIAGNOSIS — L089 Local infection of the skin and subcutaneous tissue, unspecified: Secondary | ICD-10-CM

## 2024-03-28 DIAGNOSIS — E876 Hypokalemia: Secondary | ICD-10-CM | POA: Diagnosis not present

## 2024-03-28 DIAGNOSIS — E1169 Type 2 diabetes mellitus with other specified complication: Secondary | ICD-10-CM | POA: Diagnosis not present

## 2024-03-28 DIAGNOSIS — E1159 Type 2 diabetes mellitus with other circulatory complications: Secondary | ICD-10-CM

## 2024-03-28 DIAGNOSIS — I721 Aneurysm of artery of upper extremity: Secondary | ICD-10-CM | POA: Diagnosis not present

## 2024-03-28 LAB — GLUCOSE, CAPILLARY
Glucose-Capillary: 140 mg/dL — ABNORMAL HIGH (ref 70–99)
Glucose-Capillary: 140 mg/dL — ABNORMAL HIGH (ref 70–99)
Glucose-Capillary: 131 mg/dL — ABNORMAL HIGH (ref 70–99)
Glucose-Capillary: 105 mg/dL — ABNORMAL HIGH (ref 70–99)

## 2024-03-28 LAB — CBC WITH DIFFERENTIAL/PLATELET
Abs Immature Granulocytes: 0.05 K/uL (ref 0.00–0.07)
Basophils Absolute: 0 K/uL (ref 0.0–0.1)
Basophils Relative: 0 %
Eosinophils Absolute: 0.2 K/uL (ref 0.0–0.5)
Eosinophils Relative: 2 %
HCT: 30.9 % — ABNORMAL LOW (ref 39.0–52.0)
Hemoglobin: 9.8 g/dL — ABNORMAL LOW (ref 13.0–17.0)
Immature Granulocytes: 1 %
Lymphocytes Relative: 14 %
Lymphs Abs: 1.3 K/uL (ref 0.7–4.0)
MCH: 30.7 pg (ref 26.0–34.0)
MCHC: 31.7 g/dL (ref 30.0–36.0)
MCV: 96.9 fL (ref 80.0–100.0)
Monocytes Absolute: 0.7 K/uL (ref 0.1–1.0)
Monocytes Relative: 7 %
Neutro Abs: 7.1 K/uL (ref 1.7–7.7)
Neutrophils Relative %: 76 %
Platelets: 270 K/uL (ref 150–400)
RBC: 3.19 MIL/uL — ABNORMAL LOW (ref 4.22–5.81)
RDW: 16.2 % — ABNORMAL HIGH (ref 11.5–15.5)
WBC: 9.3 K/uL (ref 4.0–10.5)
nRBC: 0.2 % (ref 0.0–0.2)

## 2024-03-28 LAB — BASIC METABOLIC PANEL WITH GFR
Anion gap: 11 (ref 5–15)
BUN: 9 mg/dL (ref 6–20)
CO2: 23 mmol/L (ref 22–32)
Calcium: 8.8 mg/dL — ABNORMAL LOW (ref 8.9–10.3)
Chloride: 103 mmol/L (ref 98–111)
Creatinine, Ser: 0.62 mg/dL (ref 0.61–1.24)
GFR, Estimated: >60 mL/min (ref >60)
Glucose, Bld: 129 mg/dL — ABNORMAL HIGH (ref 70–99)
Potassium: 3.7 mmol/L (ref 3.5–5.1)
Sodium: 137 mmol/L (ref 135–145)

## 2024-03-28 LAB — TRANSFERRIN: Transferrin: 272 mg/dL (ref 180–329)

## 2024-03-28 LAB — FERRITIN: Ferritin: 207 ng/mL (ref 24–336)

## 2024-03-28 LAB — IRON AND TIBC
Iron: 75 ug/dL (ref 45–182)
Saturation Ratios: 20 % (ref 17.9–39.5)
TIBC: 385 ug/dL (ref 250–450)
UIBC: 310 ug/dL

## 2024-03-28 MED ORDER — IOHEXOL 350 MG/ML SOLN
100.0000 mL | Freq: Once | INTRAVENOUS | Status: AC | PRN
Start: 2024-03-28 — End: 2024-03-28
  Administered 2024-03-28: 100 mL via INTRAVENOUS

## 2024-03-28 MED ORDER — IOHEXOL 9 MG/ML PO SOLN
500.0000 mL | ORAL | Status: AC
  Administered 2024-03-28 (×2): 500 mL via ORAL

## 2024-03-28 MED ORDER — SODIUM CHLORIDE 0.9 % IV SOLN
2.0000 g | Freq: Four times a day (QID) | INTRAVENOUS | Status: DC
  Administered 2024-03-28 – 2024-03-29 (×4): 2 g via INTRAVENOUS
  Filled 2024-03-28 (×5): qty 2000

## 2024-03-28 MED ORDER — SODIUM CHLORIDE 0.9 % IV SOLN
2.0000 g | INTRAVENOUS | Status: DC
  Administered 2024-03-28: 2 g via INTRAVENOUS
  Filled 2024-03-28: qty 20

## 2024-03-28 NOTE — Progress Notes (Addendum)
 Progress Note    03/28/2024 6:43 AM 4 Days Post-Op  Subjective:  sitting on side of bed comfortably.  Says he gets some shock feelings in the left arm occasionally  Afebrile HR 60's-90's  110's-120's systolic 96% RA  Vitals:   03/27/24 2335 03/28/24 0335  BP: 120/61 116/68  Pulse: 86 86  Resp: 18 19  Temp: 98 F (36.7 C) 98.3 F (36.8 C)  SpO2: 94% 96%    Physical Exam: General:  no distress Cardiac:  regular Lungs:  non labored Incisions:  left chest incision is clean with staples and nylon sutures in tact; left axillary incision moist-placed dry gauze in axilla to wick moisture.   Extremities:  easily palpable left radial pulse.  Swelling left hand stable.     CBC    Component Value Date/Time   WBC 9.3 03/28/2024 0329   RBC 3.19 (L) 03/28/2024 0329   HGB 9.8 (L) 03/28/2024 0329   HGB 17.1 10/28/2023 0809   HCT 30.9 (L) 03/28/2024 0329   HCT 50.7 10/28/2023 0809   PLT 270 03/28/2024 0329   PLT 175 10/28/2023 0809   MCV 96.9 03/28/2024 0329   MCV 91 10/28/2023 0809   MCH 30.7 03/28/2024 0329   MCHC 31.7 03/28/2024 0329   RDW 16.2 (H) 03/28/2024 0329   RDW 12.8 10/28/2023 0809   LYMPHSABS 1.3 03/28/2024 0329   LYMPHSABS 1.6 10/28/2023 0809   MONOABS 0.7 03/28/2024 0329   EOSABS 0.2 03/28/2024 0329   EOSABS 0.2 10/28/2023 0809   BASOSABS 0.0 03/28/2024 0329   BASOSABS 0.0 10/28/2023 0809    BMET    Component Value Date/Time   NA 137 03/28/2024 0329   NA 138 10/28/2023 0809   K 3.7 03/28/2024 0329   CL 103 03/28/2024 0329   CO2 23 03/28/2024 0329   GLUCOSE 129 (H) 03/28/2024 0329   BUN 9 03/28/2024 0329   BUN 15 10/28/2023 0809   CREATININE 0.62 03/28/2024 0329   CREATININE 0.68 (L) 09/29/2022 0836   CALCIUM  8.8 (L) 03/28/2024 0329   GFRNONAA >60 03/28/2024 0329   GFRNONAA 108 06/21/2020 0707   GFRAA 127 10/24/2020 0811   GFRAA 125 06/21/2020 0707    INR    Component Value Date/Time   INR 1.0 03/02/2024 1920     Intake/Output  Summary (Last 24 hours) at 03/28/2024 0643 Last data filed at 03/28/2024 0532 Gross per 24 hour  Intake 708.93 ml  Output 1375 ml  Net -666.07 ml   Component Ref Range & Units (hover) 4 d ago  Specimen Description WOUND  Special Requests LEFT INTRACLAVICULAR  Gram Stain NO WBC SEEN NO ORGANISMS SEEN Performed at [facility], [address], [place], [place] [address]  Culture RARE ENTEROCOCCUS FAECALIS NO ANAEROBES ISOLATED; CULTURE IN PROGRESS FOR 5 DAYS  Report Status PENDING  Organism ID, Bacteria ENTEROCOCCUS FAECALIS  Resulting Agency [facility]     Susceptibility   Enterococcus faecalis    MIC    AMPICILLIN  <=2 SENSITIVE Sensitive    GENTAMICIN  SYNERGY RESISTANT Resistant    VANCOMYCIN  1 SENSITIVE Sensitive                Component Ref Range & Units (hover) 4 d ago  Specimen Description WOUND  Special Requests LEFT AXILLA  Gram Stain NO WBC SEEN NO ORGANISMS SEEN Performed at [facility], [address] [place], [place] [address]  Culture RARE STREPTOCOCCUS CONSTELLATUS SUSCEPTIBILITIES TO FOLLOW NO ANAEROBES ISOLATED; CULTURE IN PROGRESS  FOR 5 DAYS  Report Status PENDING      Assessment/Plan:  58 y.o. male is s/p:  LUE angiogram with angioplasty and stenting of left axillary artery and I&D of left axillary hematoma 03/24/2024 by Dr. Magda   4 Days Post-Op   -intra-clavicular wound with rare Enterococcus faecalis and left axilla wound with rare Streptococcus constellatus, both gram positive. Blood cultures are drawn and in process. Continues to have palpable left radial pulse.  -left axillary incision is moist.  Discussed with pt that he should cleanse incisions with soap and water  daily and keep the axilla incision dry otherwise.     Lucie Apt, PA-C [facility] [phone] 03/28/2024 6:43 AM  VASCULAR STAFF ADDENDUM: I have independently interviewed and examined the patient. I agree with the above.  Two  species growing from operative cultures. At operation - no gross signs of infection. Tissue well incorporated. No serous drainage, no purulent drainage, just hematoma. That said, infection could cause early failure of pseudoaneurysm repair. Plan conservative management for now. Will treat with antibiotics and re-scan in 6 weeks to evaluate repair.  OK for discharge once we've made a plan for an outpatient antibiotic strategy. My recommendation would be 6 weeks of IV antibiotics with coverage of operative culture species.   Debby SAILOR. Magda, MD [facility] Phone Number: [phone] 03/28/2024 8:19 AM

## 2024-03-28 NOTE — Progress Notes (Signed)
   03/27/24 2230  BiPAP/CPAP/SIPAP  BiPAP/CPAP/SIPAP Pt Type Adult  BiPAP/CPAP/SIPAP DREAMSTATIOND  Reason BIPAP/CPAP not in use Non-compliant

## 2024-03-28 NOTE — Progress Notes (Signed)
 Patient ambulated in hallway with sitter 240 feet with rolling [place]. Tolerated well. Ann Nena Hoard, RN

## 2024-03-28 NOTE — Progress Notes (Signed)
   03/28/24 1940  BiPAP/CPAP/SIPAP  Reason BIPAP/CPAP not in use Non-compliant (Pt does not like our equipment.)  BiPAP/CPAP /SiPAP Vitals  Temp 98.3 F (36.8 C)  Pulse Rate 70  Resp 20  BP 115/62  SpO2 90 %  MEWS Score/Color  MEWS Score 0  MEWS Score Color Green

## 2024-03-28 NOTE — Progress Notes (Signed)
 Physical Therapy Treatment Patient Details Name: Randy Hebert MRN: [id number] DOB: 1965-10-02 Today's Date: 03/28/2024   History of Present Illness Pt is 58 yo presenting [facility] on 7/24 due to L arm pain and CT angio showing recurrence of L axillary pseudoaneurysm s/p L axillary artery angiogram and stenting as well as I and D of L axilla hematoma per vascular surgery on 7/24.  Recent hospitalization 7/11 to 03/19/24 post traumatic left shoulder dislocation, complicated with left axillary hematoma with left axillary pseudoaneurysm. PMH: Recent CVA w/L weakness (discharged 6/30), DM2, CAD, CHF, COPD, GERD, NSTEMI, MI, L THR    PT Comments  Pt received sitting EOB, agreeable to therapy session and with good participation and tolerance for transfer, gait and stair training. Pt performed Dynamic Gait Index with score of 22/24, indicating no significant increased risk of falls, but did need safety cues and CGA for stair negotiation without rails. Pt reports no STE home this was performed simply as part of falls risk assessment. Pt receptive to instruction, noted LUE edema increased during standing activity pt receptive to instruction on LUE positioning while seated/supine for edema mgmt. He may benefit from LUE sling use if standing for prolonged periods. Anticipate no current further acute rehab needs, will notify supervising PT but continue to recommend OPPT in neuro OP setting to try to progress to optimal level of functioning.    If plan is discharge home, recommend the following: Help with stairs or ramp for entrance;Other (comment) (as needed)   Can travel by private vehicle        Equipment Recommendations  None recommended by PT    Recommendations for Other Services       Precautions / Restrictions Precautions Precautions: Fall Recall of Precautions/Restrictions: Intact Precaution/Restrictions Comments: recent L shoulder dislocation - no er with shoulder abducted 90* Required Braces  or Orthoses: Sling (sling recommended by MD. Pt has declined to wear) Restrictions Weight Bearing Restrictions Per Provider Order: Yes LUE Weight Bearing Per Provider Order: Non weight bearing     Mobility  Bed Mobility               General bed mobility comments: pt received sitting EOB and requesting to remain sitting up at end of session.    Transfers Overall transfer level: Modified independent Equipment used: None Transfers: Sit to/from Stand Sit to Stand: Modified independent (Device/Increase time)           General transfer comment: no assist needed    Ambulation/Gait Ambulation/Gait assistance: Modified independent (Device/Increase time)   Assistive device: None Gait Pattern/deviations: WFL(Within Functional Limits)   Gait velocity interpretation: >2.62 ft/sec, indicative of community ambulatory   General Gait Details: see DGI; no acute s/sx distress other than LUE pain.   Stairs Stairs: Yes Stairs assistance: Contact guard assist Stair Management: No rails, Forwards, Alternating pattern Number of Stairs: 3 General stair comments: as part of DGI, but pt reports no need to perform stairs at home; discussed he should perform step-to pattern for safety if having to perform stairs as pt mildly unsteady and catching weaker L foot on step when stepping up with reciprocal pattern, but no buckling; pt receptive.   Wheelchair Mobility     Tilt Bed    Modified Rankin (Stroke Patients Only)       Balance Overall balance assessment: History of Falls  Standardized Balance Assessment Standardized Balance Assessment : Dynamic Gait Index   Dynamic Gait Index Level Surface: Normal Change in Gait Speed: Normal Gait with Horizontal Head Turns: Normal Gait with Vertical Head Turns: Normal Gait and Pivot Turn: Normal Step Over Obstacle: Normal Step Around Obstacles: Mild Impairment Steps: Mild Impairment Total  Score: 22      Communication Communication Communication: No apparent difficulties  Cognition Arousal: Alert Behavior During Therapy: WFL for tasks assessed/performed   PT - Cognitive impairments: No apparent impairments                         Following commands: Intact      Cueing Cueing Techniques: Verbal cues  Exercises      General Comments General comments (skin integrity, edema, etc.): SpO2 88% and above on RA with exertion; of note, sensor was on finger of LUE so unsure if fully accurate; HR to 108 bpm with exertion; pt instructed on LUE resting position for edema mgmt      Pertinent Vitals/Pain Pain Assessment Pain Assessment: Faces Faces Pain Scale: Hurts little more Pain Location: L shoulder Pain Descriptors / Indicators: Grimacing, Discomfort, Guarding Pain Intervention(s): Limited activity within patient's tolerance, Monitored during session, Repositioned, Other (comment) (reinforced edema mgmt at rest and use of sling PRN for pain mgmt)    Home Living                          Prior Function            PT Goals (current goals can now be found in the care plan section) Acute Rehab PT Goals Patient Stated Goal: return to baseline level of function PT Goal Formulation: With patient Time For Goal Achievement: 04/08/24 Progress towards PT goals: Progressing toward goals    Frequency    Min 1X/week      PT Plan      Co-evaluation              AM-PAC PT 6 Clicks Mobility   Outcome Measure  Help needed turning from your back to your side while in a flat bed without using bedrails?: None Help needed moving from lying on your back to sitting on the side of a flat bed without using bedrails?: None Help needed moving to and from a bed to a chair (including a wheelchair)?: None Help needed standing up from a chair using your arms (e.g., wheelchair or bedside chair)?: None Help needed to walk in hospital room?: None Help  needed climbing 3-5 steps with a railing? : A Little 6 Click Score: 23    End of Session Equipment Utilized During Treatment: Gait belt Activity Tolerance: Patient tolerated treatment well Patient left: in bed;with call bell/phone within reach;Other (comment) (bed alarm was not on when PTA arrived, left off but RN/NT notified in case they need to place it on) Nurse Communication: Mobility status PT Visit Diagnosis: Unsteadiness on feet (R26.81);Other abnormalities of gait and mobility (R26.89);Muscle weakness (generalized) (M62.81)     Time: 8374-8365 PT Time Calculation (min) (ACUTE ONLY): 9 min  Charges:    $Gait Training: 8-22 mins PT General Charges $$ ACUTE PT VISIT: 1 Visit                     Louetta Hollingshead P., PTA Acute Rehabilitation Services Secure Chat Preferred 9a-5:30pm Office: [phone]    Connell HERO [facility] 03/28/2024, 6:40 PM

## 2024-03-28 NOTE — Progress Notes (Addendum)
 Occupational Therapy Treatment Patient Details Name: Randy Hebert MRN: [id number] DOB: 09-28-1965 Today's Date: 03/28/2024   History of present illness Pt is 58 yo presenting to [facility] on 7/24 due to L arm pain and CT angio showing recurrence of L axillary pseudoaneurysm s/p L axillary artery angiogram and stenting as well as I and D of L axilla hematoma per vascular surgery on 7/24. PMH: Recent CVA (discharged 6/30), DM2, CAD, CHF, COPD, GERD, NSTEMI, MI, L THR   OT comments  Pt in bed upon therapy arrival and agreeable to participate in OT evaluation. Session focused on edema management techniques to the LUE. Pt educated on strategies such as positioning techniques and completing ROM exercises to help decrease swelling. Handouts provided for reference including A/ROM hand, wrist, and elbow ranges and AA/ROM shoulder flexion/abduction.       If plan is discharge home, recommend the following:  Assistance with cooking/housework;Assist for transportation   Equipment Recommendations  None recommended by OT       Precautions / Restrictions Precautions Precautions: Fall Recall of Precautions/Restrictions: Intact Precaution/Restrictions Comments: recent L shoulder dislocation - no er with shoulder abducted 90* Required Braces or Orthoses: Sling (sling recommended by MD. Pt has declined to wear) Restrictions Weight Bearing Restrictions Per Provider Order: Yes LUE Weight Bearing Per Provider Order: Non weight bearing       Mobility Bed Mobility Overal bed mobility: Modified Independent              ADL either performed or assessed with clinical judgement    Extremity/Trunk Assessment Upper Extremity Assessment LUE Deficits / Details: Manual techniques completed to LUE to decrease edema. Pt education provided on positionining techniques when in bed and sitting upright to help support should if pt declines to wear sling. Educated on edema management techniques including use of  pillows and ROM exercises. Pt educated on A/ROM hand, wrist, and elbow exercises and AA/ROM shoulder exercises with handout provided for reference.                     Communication Communication Communication: No apparent difficulties   Cognition Arousal: Alert Behavior During Therapy: WFL for tasks assessed/performed Cognition: No apparent impairments        Following commands: Intact        Cueing   Cueing Techniques: Verbal cues  Exercises  Access Code: [email or web address] URL: [email or web address] Date: 03/28/2024 Prepared by: Leita Howell  Exercises - Supine Active Assistive Single Arm Shoulder flexion  - 1 x daily - 7 x weekly - 1 sets - 10-15 reps - Seated Shoulder Flexion Towel Slide at Table Top  - 1 x daily - 7 x weekly - 1 sets - 10-15 reps - Seated Shoulder Scaption Slide at Table Top with Forearm in Neutral  - 1 x daily - 7 x weekly - 1 sets - 10-15 reps            Pertinent Vitals/ Pain       Pain Assessment Pain Assessment: Faces Faces Pain Scale: Hurts little more Pain Location: L shoulder Pain Descriptors / Indicators: Grimacing, Discomfort Pain Intervention(s): Limited activity within patient's tolerance, Monitored during session         Frequency  Min 2X/week        Progress Toward Goals  OT Goals(current goals can now be found in the care plan section)  Progress towards OT goals: Progressing toward goals            AM-PAC OT 6  Clicks Daily Activity     Outcome Measure   Help from another person eating meals?: A Little Help from another person taking care of personal grooming?: A Little Help from another person toileting, which includes using toliet, bedpan, or urinal?: A Little Help from another person bathing (including washing, rinsing, drying)?: A Little Help from another person to put on and taking off regular upper body clothing?: A Little Help from another person to put on and taking off regular lower body  clothing?: A Little 6 Click Score: 18    End of Session    OT Visit Diagnosis: Muscle weakness (generalized) (M62.81);Pain Pain - Right/Left: Left Pain - part of body: Shoulder   Activity Tolerance Patient tolerated treatment well   Patient Left in bed;with call bell/phone within reach           Time: 1025-1110 OT Time Calculation (min): 45 min  Charges: OT General Charges $OT Visit: 1 Visit OT Treatments $Therapeutic Activity: 8-22 mins $Massage: 23-37 mins  [employer], OTR/L,CBIS  Supplemental OT - [facility] and [facility] Secure Chat Preferred    Fancy Dunkley, Leita BIRCH 03/28/2024, 1:59 PM

## 2024-03-28 NOTE — Consult Note (Signed)
 [facility]    Date of Admission:  03/24/2024     Total days of antibiotics 5               Reason for Consult: Enterococcus infection    Referring Provider: Dr. Noralee  Primary Care Provider: Cook, Jayce G, DO   ASSESSMENT:  Randy Hebert is a 58 year old Caucasian male with recent history of CVA with left-sided weakness and findings of left axillary pseudoaneurysm status postrepair with course complicated by fall and recurrence of left axillary pseudoaneurysm status post left axillary artery angioplasty and stenting and incision and drainage of left axillary hematoma.  Surgical specimens growing Enterococcus faecalis and Streptococcus constellatus.   Randy Hebert blood cultures from 03/27/2024 are without growth to date and is postop day #4.  Tolerating antibiotics with no adverse side effects.  Discussed recommended plan of care to change antibiotics to ampicillin .  Will obtain CT imaging of abdomen in the setting of Streptococcus constellatus with concern for potential abscess.  Continue to monitor blood cultures for bacteremia.  Monitor surgical specimens for sensitivities and adjust antibiotics accordingly.  Standard/universal precautions.  Will hold PICC line placement for now.  Postoperative wound care per vascular surgery.  Tailored/universal precautions.  Remaining medical and supportive care per internal medicine.   PLAN:  Change antimicrobial therapy to ampicillin . CT with contrast abdomen/pelvis to check for abscess in the setting of Streptococcus constellatus. Postoperative wound care per vascular surgery. Monitor blood cultures and surgical specimens and adjust antibiotics accordingly. Standard/universal precautions. Remaining medical and supportive care per internal medicine.   Principal Problem:   Aneurysm of axillary artery (HCC) Active Problems:   CAD (coronary artery disease)   Asthmatic bronchitis , chronic (HCC)   Essential  hypertension   Type 2 diabetes mellitus with hyperlipidemia (HCC)   Depression   Hypokalemia   Acute postoperative anemia due to greater than expected blood loss   Obesity, class 1    acetaminophen   1,000 mg Oral TID   amLODipine   10 mg Oral Daily   aspirin  EC  81 mg Oral Daily   atorvastatin   80 mg Oral Daily   citalopram   20 mg Oral Daily   clopidogrel   75 mg Oral Daily   dapagliflozin  propanediol  10 mg Oral Daily   enoxaparin  (LOVENOX ) injection  40 mg Subcutaneous QHS   ezetimibe   10 mg Oral Daily   fluticasone  furoate-vilanterol  1 puff Inhalation Daily   gabapentin   300 mg Oral TID   insulin  aspart  0-15 Units Subcutaneous TID WC   insulin  aspart  0-5 Units Subcutaneous QHS   metFORMIN   500 mg Oral BID WC   methocarbamol   1,000 mg Oral TID   pantoprazole   40 mg Oral Daily   sodium chloride  flush  3 mL Intravenous Q12H     HPI: Randy Hebert is a 58 y.o. male with previous medical history of type 2 diabetes, hypertension, coronary artery disease, liver cirrhosis, obstructive sleep apnea, and stroke with left-sided weakness presenting with acute worsening left axillary pain in the setting of recent open exposure and repair of the left axillary pseudoaneurysm and evacuation left axillary hematoma on 03/12/2024.  Randy Hebert was recently admitted to the hospital on 03/11/2024 until 03/19/2024 following a fall after tripping on a screw and landed on his left shoulder in the setting of recent stroke with left-sided weakness on 03/02/2024.  Found to have left shoulder dislocation and left axillary hematoma with active extravasation.  Underwent repair of left axillary pseudoaneurysm on 03/12/2024 by vascular surgery.  Randy Hebert presents to the ED on 03/24/2024 with left shoulder pain and swelling.  Afebrile with white blood cell count of 12,200.  CT imaging concerning for recurrence of pseudoaneurysm of the left axillary artery.  Underwent successful stenting of the left axillary artery  and incision and drainage of left axillary hematoma on 03/24/2024.  Surgical specimens obtained growing Streptococcus constellatus and Enterococcus faecalis.  Sensitivities pending.  Blood cultures from 03/27/2024 without growth to date.  ID has been asked for antibiotic recommendations.   Review of Systems: Review of Systems  Constitutional:  Negative for chills, fever and weight loss.  Respiratory:  Negative for cough, shortness of breath and wheezing.   Cardiovascular:  Negative for chest pain and leg swelling.  Gastrointestinal:  Negative for abdominal pain, constipation, diarrhea, nausea and vomiting.  Skin:  Negative for rash.     Past Medical History:  Diagnosis Date   ACE inhibitor-aggravated angioedema    Allergy     Anemia    Angio-edema    Anxiety    Arthritis    Asthma    hip replacement   Back pain    Bradycardia 04/28/2017   Bulging of cervical intervertebral disc    CAD (coronary artery disease)    lateral STEMI 02/06/2015 00% D1 occlusion treated with Promus Premier 2.5 mm x 16 mm DES, 70% ramus stenosis, 40% mid RCA stenosis, 45% distal RCA stenosis, EF 45-50%   CHF (congestive heart failure) (HCC)    COPD (chronic obstructive pulmonary disease) (HCC)    Depression    Diabetes mellitus without complication (HCC)    Difficult intubation    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   Dry eye    Dyspnea    Early satiety 09/23/2016   Fatty liver    GERD (gastroesophageal reflux disease)    HCAP (healthcare-associated pneumonia) 05/15/2017   Headache    Heart murmur    Hip pain    Hyperlipidemia    Hypertension    Lobar pneumonia (HCC) 05/15/2017   Melena 08/04/2018   MI (myocardial infarction) [facility])    Myocardial infarction (HCC)    Neck pain    Non-ST elevation (NSTEMI) myocardial infarction (HCC) 04/27/2017   NSTEMI (non-ST elevated myocardial infarction) (HCC) 04/26/2017   Otitis media    Pleurisy    Pneumonia due to COVID-19 virus 10/07/2019    Rectal bleeding 11/08/2018   Right shoulder pain 03/20/2020   Sinus pause    9 sec sinus pause on telemetry after started on coreg  after MI, avoid AV nodal blocking agent   Sleep apnea    uses a cpap   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   STEMI (ST elevation myocardial infarction) (HCC) 05/31/2015   Substance abuse (HCC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   Syncope 06/29/2017   Syncope and collapse 05/15/2017   Transaminitis 08/04/2018   Unilateral primary osteoarthritis, right hip 07/01/2016    Social History   Tobacco Use   Smoking status: Every Day    Current packs/day: 1.00    Average packs/day: 1 pack/day for 46.0 years (46.0 ttl pk-yrs)    Types: Cigarettes    Passive exposure: Never   Smokeless tobacco: Former    Types: Chew   Tobacco comments:    Smokes 1 pack a day of cigarettes (20 cig)10/16/23  Vaping Use   Vaping status: Former  Substance Use Topics   Alcohol  use: Yes  Comment: rare beer use   Drug use: No    Comment: history of drug use- marijuana, cocaine- quit 2014    Family History  Problem Relation Age of Onset   Heart attack Father    Stroke Father    Arthritis Father    Heart disease Father    Cancer Mother        ???   Arthritis Mother    Heart disease Brother 53       died in sleep   Early death Brother    Diabetes Maternal Uncle    Alzheimer's disease Maternal Grandmother     Allergies  Allergen Reactions   Coreg  [Carvedilol ] Other (See Comments)    Sinus pause on telemetry >3 seconds. Longest one 9 sec. No AV nodal agent   Zestril  [Lisinopril ] Anaphylaxis, Shortness Of Breath and Swelling    Angioedema, required intubation and mechanical ventilation   Chantix  [Varenicline ] Other (See Comments)    Nightmares Insomnia    Amoxil  [Amoxicillin ] Nausea Only    OBJECTIVE: Blood pressure 119/81, pulse 82, temperature 98.6 F (37 C), temperature source Oral, resp. rate 16, height 5' 5 (1.651 m), weight 86.2 kg, SpO2 94%.  Physical  Exam Constitutional:      General: He is not in acute distress.    Appearance: He is well-developed.  Cardiovascular:     Rate and Rhythm: Normal rate and regular rhythm.     Heart sounds: Normal heart sounds.  Pulmonary:     Effort: Pulmonary effort is normal.     Breath sounds: Normal breath sounds.  Skin:    General: Skin is warm and dry.  Neurological:     Mental Status: He is alert and oriented to person, place, and time.     Lab Results Lab Results  Component Value Date   WBC 9.3 03/28/2024   HGB 9.8 (L) 03/28/2024   HCT 30.9 (L) 03/28/2024   MCV 96.9 03/28/2024   PLT 270 03/28/2024    Lab Results  Component Value Date   CREATININE 0.62 03/28/2024   BUN 9 03/28/2024   NA 137 03/28/2024   K 3.7 03/28/2024   CL 103 03/28/2024   CO2 23 03/28/2024    Lab Results  Component Value Date   ALT 38 03/24/2024   AST 47 (H) 03/24/2024   ALKPHOS 210 (H) 03/24/2024   BILITOT 3.0 (H) 03/24/2024     Microbiology: Recent Results (from the past 240 hours)  Surgical pcr screen     Status: None   Collection Time: 03/24/24  7:46 AM   Specimen: Nasal Mucosa; Nasal Swab  Result Value Ref Range Status   MRSA, PCR NEGATIVE NEGATIVE Final   Staphylococcus aureus NEGATIVE NEGATIVE Final    Comment: (NOTE) The Xpert SA Assay (FDA approved for NASAL specimens in patients 83 years of age and older), is one component of a comprehensive surveillance program. It is not intended to diagnose infection nor to guide or monitor treatment. Performed at [facility], [address], [place], [place] [address]   Aerobic/Anaerobic Culture w Gram Stain (surgical/deep wound)     Status: None (Preliminary result)   Collection Time: 03/24/24 12:23 PM   Specimen: Path fluid; Tissue  Result Value Ref Range Status   Specimen Description WOUND  Final   Special Requests LEFT AXILLA  Final   Gram Stain   Final    NO WBC SEEN NO ORGANISMS SEEN Performed at [facility], [address], [place], [place] [address]    Culture   Final    RARE STREPTOCOCCUS CONSTELLATUS NO ANAEROBES ISOLATED; CULTURE IN PROGRESS FOR 5 DAYS    Report Status PENDING  Incomplete   Organism ID, Bacteria STREPTOCOCCUS CONSTELLATUS  Final      Susceptibility   Streptococcus constellatus - MIC*    PENICILLIN <=0.06 SENSITIVE Sensitive     CEFTRIAXONE  0.5 SENSITIVE Sensitive     ERYTHROMYCIN >=8 RESISTANT Resistant     LEVOFLOXACIN  <=0.25 SENSITIVE Sensitive     VANCOMYCIN  0.5 SENSITIVE Sensitive     * RARE STREPTOCOCCUS CONSTELLATUS  Aerobic/Anaerobic Culture w Gram Stain (surgical/deep wound)     Status: None (Preliminary result)   Collection Time: 03/24/24 12:26 PM   Specimen: Path fluid; Tissue  Result Value Ref Range Status   Specimen Description WOUND  Final   Special Requests LEFT INTRACLAVICULAR  Final   Gram Stain   Final    NO WBC SEEN NO ORGANISMS SEEN Performed at [facility], [address], [place], [place] [address]    Culture   Final    RARE ENTEROCOCCUS FAECALIS NO ANAEROBES ISOLATED; CULTURE IN PROGRESS FOR 5 DAYS    Report Status PENDING  Incomplete   Organism ID, Bacteria ENTEROCOCCUS FAECALIS  Final      Susceptibility   Enterococcus faecalis - MIC*    AMPICILLIN  <=2 SENSITIVE Sensitive     VANCOMYCIN  1 SENSITIVE Sensitive     GENTAMICIN  SYNERGY RESISTANT Resistant     * RARE ENTEROCOCCUS FAECALIS  Culture, blood (Routine X 2) w Reflex to ID Panel     Status: None (Preliminary result)   Collection Time: 03/27/24  9:03 AM   Specimen: BLOOD RIGHT ARM  Result Value Ref Range Status   Specimen Description BLOOD RIGHT ARM  Final   Special Requests   Final    BOTTLES DRAWN AEROBIC ONLY Blood Culture adequate volume   Culture   Final    NO GROWTH < 24 HOURS Performed at [facility], [address] [place], [place] [address]    Report Status PENDING  Incomplete  Culture, blood (Routine X 2) w Reflex to ID Panel     Status: None (Preliminary  result)   Collection Time: 03/27/24  9:03 AM   Specimen: BLOOD RIGHT HAND  Result Value Ref Range Status   Specimen Description BLOOD RIGHT HAND  Final   Special Requests   Final    BOTTLES DRAWN AEROBIC ONLY Blood Culture adequate volume   Culture   Final    NO GROWTH < 24 HOURS Performed at [facility], [address], [place], [place] [address]    Report Status PENDING  Incomplete     Cathlyn July, NP [facility]  03/28/2024  4:26 PM

## 2024-03-28 NOTE — Progress Notes (Signed)
 Progress Note   Patient: Randy Hebert FMW:[id number] DOB: 1966/06/14 DOA: 03/24/2024     4 DOS: the patient was seen and examined on 03/28/2024   Brief hospital course: Randy Hebert was admitted to the hospital with the working diagnosis of recurrent left axillary pseudoaneurysm, requiring stenting and hematoma drainage.   58 yo male with past medical history of diabetes mellitus type 2, hypertension, CAD, liver cirrhosis, anxiety, depression, OSA, CVA w/ left-sided weakness who had a recent hospitalization 7/11 to 03/19/24 post traumatic left shoulder dislocation, complicated with left axillary hematoma with left axillary pseudoaneurysm. On 07/12 he underwent open exposure and repair of the left axillary pseudoaneurysm and evacuation of left axillary hematoma.  07/24 [facility] to [facility] with acute worsening left axillary pain along with decreased left upper extremity mobility due to pain. His blood pressure was 135/92, HR 106, RR 16 and 02 saturation 92%.  Lungs with no wheezing or rales, heart with S1 and S2 present and regular, abdomen with no distention and no lower extremity edema.  Left upper extremity with edema.  Positive large ecchymosis upper chest and left upper extremity.   Na 137, K 3,4 Cl 102 bicarbonate 19 glucose 180 bun 7 cr 0,63  AST 47 ALT 38  High sensitive troponin 14 and 11  Wbc 11,6 hgb 12,7 plt 362   Ct angiography left upper extremity with 1,8 to 1.5 cm pseudoaneurysm arising from the posterior wall of the left axillary artery, with a 1,3 cm length contrast tract from the posterior wall into the pseudoaneurysm.  1,8 to 1.1 cm contrast pocket in the posterior aspect of the hematoma.  0.1 to 6.9 x 9.7 hematoma in the left axillary space,  5.6 to 3.9 cm hematoma in the left subscapularis space.  5.1 to 3,8 hematoma in the left triceps muscle.  Non displaced recent fractures of the anterolateral left sixth, seven and eights ribs.   CT chest with enlarging left  axillary hematoma with arterial contrast extravasation from the posterior wall of thel left axillary artery into a 1.5 to 1,8 cm pseudoaneurysm, with additional 1,8 to 1,1 cm pocket of contrast more posteriorly within the collection.   EKG 98 bpm, right axis deviation, right bundle branch block, qtc 513, sinus rhythm with 1st degree AV block, no significant ST segment or T wave changes.   Patient was [facility] to [facility] under vascular surgery service.   07/24 patient underwent: 1) ultrasound guided right common femoral artery access  2) arch aortogram 3) Left upper extremity angiogram with first order cannulation  4) Left axillary artery angioplasty and stenting 5) Incision and drainage of left axillary hematoma   07/26 transfer to [facility]. Drain was removed.  07/27 hgb stable with no indication for PRBC transfusion, pain controlled with analgesics.  Left axillary wound culture positive for enterococcus fecalis, started on antibiotic therapy with IV Zosyn .  07/28 consulted ID for possible outpatient IV antibiotic therapy  Assessment and Plan: * Aneurysm of axillary artery (HCC) Plan to continue with aspirin , clopidogrel  and statin . Drain to be removed 07/26   Follow up on post op care from vascular surgery.  Positive culture from axillary wound (surgical culture), for enterococcus fecalis and constellatus.  Sensitive to ampicillin  and penicillin.  Blood cultures with no growth.  Wbc is 9.3 and he has been afebrile.   Consulted ID for further antibiotic therapy.   Essential hypertension Continue blood pressure control with amlodipine .    Hypokalemia Stable renal function with serum cr  at 0,62 with K at 3,7 and serum bicarbonate at 23  Na 137   Type 2 diabetes mellitus with hyperlipidemia (HCC) Continue glucose cover and monitoring with insulin  sliding scale Continue with metformin  and SGLT 2 inh.   Continue ezetimibe  and statin   CAD (coronary artery disease) Continue aspirin ,  clopidgrel and statin   Asthmatic bronchitis , chronic (HCC) No signs of acute exacerbation  Acute postoperative anemia due to greater than expected blood loss Hgb today is 9.8, no current indication for PRBC transfusion Iron stores with no deficiency.   Depression Continue with citalopram   Obesity, class 1 Calculated BMI 31.6     Subjective: Patient is feeling well, no chest pain and no dyspnea.   Physical Exam: Vitals:   03/28/24 0335 03/28/24 0825 03/28/24 0844 03/28/24 1215  BP: 116/68  (!) 112/90 128/79  Pulse: 86  99 90  Resp: 19  (!) 23 16  Temp: 98.3 F (36.8 C)  98.4 F (36.9 C) (P) 98.1 F (36.7 C)  TempSrc: Oral  Oral Oral  SpO2: 96% 96% 97%   Weight:      Height:       Neurology awake and alert ENT with mild pallor with no icterus Cardiovascular with S1 and S2 present and regular with no gallops, rubs or murmurs Respiratory with no rales or wheezing, no rhonchi  Abdomen with no distention  No lower extremity edema Left upper extremity with mild non pitting edema, surgical wound with no drainage.  Diffuse ecchymosis chest, abdomen and left upper extremity   Data Reviewed:    Family Communication: no family at the bedside   Disposition: Status is: Inpatient Remains inpatient appropriate because: follow up on final ID recommendations   Planned Discharge Destination: Home    Author: Elidia Toribio [place], MD 03/28/2024 12:19 PM  For on call review [email or web address].

## 2024-03-29 ENCOUNTER — Other Ambulatory Visit (HOSPITAL_COMMUNITY): Payer: Self-pay

## 2024-03-29 DIAGNOSIS — E1169 Type 2 diabetes mellitus with other specified complication: Secondary | ICD-10-CM | POA: Diagnosis not present

## 2024-03-29 DIAGNOSIS — E876 Hypokalemia: Secondary | ICD-10-CM | POA: Diagnosis not present

## 2024-03-29 DIAGNOSIS — I721 Aneurysm of artery of upper extremity: Secondary | ICD-10-CM | POA: Diagnosis not present

## 2024-03-29 DIAGNOSIS — I10 Essential (primary) hypertension: Secondary | ICD-10-CM | POA: Diagnosis not present

## 2024-03-29 LAB — CBC WITH DIFFERENTIAL/PLATELET
Abs Immature Granulocytes: 0.07 K/uL (ref 0.00–0.07)
Basophils Absolute: 0 K/uL (ref 0.0–0.1)
Basophils Relative: 0 %
Eosinophils Absolute: 0.2 K/uL (ref 0.0–0.5)
Eosinophils Relative: 3 %
HCT: 34.2 % — ABNORMAL LOW (ref 39.0–52.0)
Hemoglobin: 11 g/dL — ABNORMAL LOW (ref 13.0–17.0)
Immature Granulocytes: 1 %
Lymphocytes Relative: 19 %
Lymphs Abs: 1.3 K/uL (ref 0.7–4.0)
MCH: 31 pg (ref 26.0–34.0)
MCHC: 32.2 g/dL (ref 30.0–36.0)
MCV: 96.3 fL (ref 80.0–100.0)
Monocytes Absolute: 0.6 K/uL (ref 0.1–1.0)
Monocytes Relative: 8 %
Neutro Abs: 4.9 K/uL (ref 1.7–7.7)
Neutrophils Relative %: 69 %
Platelets: 307 K/uL (ref 150–400)
RBC: 3.55 MIL/uL — ABNORMAL LOW (ref 4.22–5.81)
RDW: 16.8 % — ABNORMAL HIGH (ref 11.5–15.5)
WBC: 7 K/uL (ref 4.0–10.5)
nRBC: 0 % (ref 0.0–0.2)

## 2024-03-29 LAB — AEROBIC/ANAEROBIC CULTURE W GRAM STAIN (SURGICAL/DEEP WOUND)
Gram Stain: NONE SEEN
Gram Stain: NONE SEEN

## 2024-03-29 LAB — GLUCOSE, CAPILLARY
Glucose-Capillary: 147 mg/dL — ABNORMAL HIGH (ref 70–99)
Glucose-Capillary: 128 mg/dL — ABNORMAL HIGH (ref 70–99)

## 2024-03-29 MED ORDER — METFORMIN HCL 500 MG PO TABS
500.0000 mg | ORAL_TABLET | Freq: Two times a day (BID) | ORAL | 0 refills | Status: DC
  Filled 2024-03-29: qty 60, 30d supply, fill #0

## 2024-03-29 MED ORDER — AMOXICILLIN-POT CLAVULANATE 875-125 MG PO TABS
1.0000 | ORAL_TABLET | Freq: Two times a day (BID) | ORAL | Status: DC
  Administered 2024-03-29: 1 via ORAL
  Filled 2024-03-29: qty 1

## 2024-03-29 MED ORDER — AMOXICILLIN-POT CLAVULANATE 875-125 MG PO TABS
1.0000 | ORAL_TABLET | Freq: Two times a day (BID) | ORAL | 0 refills | Status: AC
  Filled 2024-03-29: qty 82, 41d supply, fill #0

## 2024-03-29 MED ORDER — OXYCODONE HCL 5 MG PO TABS
5.0000 mg | ORAL_TABLET | Freq: Four times a day (QID) | ORAL | 0 refills | Status: DC | PRN
  Filled 2024-03-29: qty 15, 4d supply, fill #0

## 2024-03-29 MED ORDER — CLOPIDOGREL BISULFATE 75 MG PO TABS
75.0000 mg | ORAL_TABLET | Freq: Every day | ORAL | 0 refills | Status: DC
Start: 2024-03-30 — End: 2024-06-02
  Filled 2024-03-29: qty 30, 30d supply, fill #0

## 2024-03-29 MED ORDER — DAPAGLIFLOZIN PROPANEDIOL 10 MG PO TABS
10.0000 mg | ORAL_TABLET | Freq: Every day | ORAL | 0 refills | Status: DC
  Filled 2024-03-29: qty 30, 30d supply, fill #0

## 2024-03-29 MED ORDER — ATORVASTATIN CALCIUM 80 MG PO TABS
80.0000 mg | ORAL_TABLET | Freq: Every day | ORAL | 2 refills | Status: DC
  Filled 2024-03-29: qty 30, 30d supply, fill #0

## 2024-03-29 MED ORDER — NAPROXEN 375 MG PO TABS
375.0000 mg | ORAL_TABLET | Freq: Two times a day (BID) | ORAL | 0 refills | Status: DC | PRN
  Filled 2024-03-29: qty 20, 10d supply, fill #0

## 2024-03-29 NOTE — Progress Notes (Signed)
  Progress Note    03/29/2024 7:40 AM 5 Days Post-Op  Subjective:  he is doing okay this morning, says his arm soreness is about the same    Vitals:   03/28/24 2306 03/29/24 0314  BP: 117/69 118/79  Pulse: 79 83  Resp: 20 20  Temp: 98.2 F (36.8 C) 97.7 F (36.5 C)  SpO2: 100% 96%    Physical Exam: General:  sitting up in bed, NAD Lungs:  nonlabored Incisions:  left chest incision c/d/l, left axillary incision intact with mild serosanguineous drainage Extremities:  palpable left radial pulse, edema in left hand   CBC    Component Value Date/Time   WBC 7.0 03/29/2024 0302   RBC 3.55 (L) 03/29/2024 0302   HGB 11.0 (L) 03/29/2024 0302   HGB 17.1 10/28/2023 0809   HCT 34.2 (L) 03/29/2024 0302   HCT 50.7 10/28/2023 0809   PLT 307 03/29/2024 0302   PLT 175 10/28/2023 0809   MCV 96.3 03/29/2024 0302   MCV 91 10/28/2023 0809   MCH 31.0 03/29/2024 0302   MCHC 32.2 03/29/2024 0302   RDW 16.8 (H) 03/29/2024 0302   RDW 12.8 10/28/2023 0809   LYMPHSABS 1.3 03/29/2024 0302   LYMPHSABS 1.6 10/28/2023 0809   MONOABS 0.6 03/29/2024 0302   EOSABS 0.2 03/29/2024 0302   EOSABS 0.2 10/28/2023 0809   BASOSABS 0.0 03/29/2024 0302   BASOSABS 0.0 10/28/2023 0809    BMET    Component Value Date/Time   NA 137 03/28/2024 0329   NA 138 10/28/2023 0809   K 3.7 03/28/2024 0329   CL 103 03/28/2024 0329   CO2 23 03/28/2024 0329   GLUCOSE 129 (H) 03/28/2024 0329   BUN 9 03/28/2024 0329   BUN 15 10/28/2023 0809   CREATININE 0.62 03/28/2024 0329   CREATININE 0.68 (L) 09/29/2022 0836   CALCIUM  8.8 (L) 03/28/2024 0329   GFRNONAA >60 03/28/2024 0329   GFRNONAA 108 06/21/2020 0707   GFRAA 127 10/24/2020 0811   GFRAA 125 06/21/2020 0707    INR    Component Value Date/Time   INR 1.0 03/02/2024 1920     Intake/Output Summary (Last 24 hours) at 03/29/2024 0740 Last data filed at 03/29/2024 0600 Gross per 24 hour  Intake 783 ml  Output 1750 ml  Net -967 ml       Assessment/Plan:  58 y.o. male is 5 days post op, s/p: LUE angiogram with angioplasty and stenting of left axillary artery and I&D of left axillary hematoma 03/24/2024 by Dr. Magda     -He is feeling okay this morning and his pain remains well controlled -Left chest and axillary incisions healing appropriately with mild serosanguineous drainage -He remains afebrile without leukocytosis -ID has been consulted by primary for outpatient IV abx    Caretha Rumbaugh, PA-C [facility] [phone] 03/29/2024 7:40 AM

## 2024-03-29 NOTE — Plan of Care (Signed)
  Problem: Education: Goal: Knowledge of General Education information will improve Description: Including pain rating scale, medication(s)/side effects and non-pharmacologic comfort measures Outcome: Progressing   Problem: Health Behavior/Discharge Planning: Goal: Ability to manage health-related needs will improve Outcome: Progressing   Problem: Clinical Measurements: Goal: Ability to maintain clinical measurements within normal limits will improve Outcome: Progressing Goal: Will remain free from infection Outcome: Progressing Goal: Diagnostic test results will improve Outcome: Progressing Goal: Respiratory complications will improve Outcome: Progressing Goal: Cardiovascular complication will be avoided Outcome: Progressing   Problem: Activity: Goal: Risk for activity intolerance will decrease Outcome: Progressing   Problem: Nutrition: Goal: Adequate nutrition will be maintained Outcome: Progressing   Problem: Coping: Goal: Level of anxiety will decrease Outcome: Progressing   Problem: Elimination: Goal: Will not experience complications related to bowel motility Outcome: Progressing Goal: Will not experience complications related to urinary retention Outcome: Progressing   Problem: Pain Managment: Goal: General experience of comfort will improve and/or be controlled Outcome: Progressing   Problem: Safety: Goal: Ability to remain free from injury will improve Outcome: Progressing   Problem: Skin Integrity: Goal: Risk for impaired skin integrity will decrease Outcome: Progressing   Problem: Education: Goal: Ability to describe self-care measures that may prevent or decrease complications (Diabetes Survival Skills Education) will improve Outcome: Progressing   Problem: Coping: Goal: Ability to adjust to condition or change in health will improve Outcome: Progressing   Problem: Fluid Volume: Goal: Ability to maintain a balanced intake and output will  improve Outcome: Progressing   Problem: Health Behavior/Discharge Planning: Goal: Ability to identify and utilize available resources and services will improve Outcome: Progressing Goal: Ability to manage health-related needs will improve Outcome: Progressing   Problem: Metabolic: Goal: Ability to maintain appropriate glucose levels will improve Outcome: Progressing   Problem: Nutritional: Goal: Maintenance of adequate nutrition will improve Outcome: Progressing Goal: Progress toward achieving an optimal weight will improve Outcome: Progressing   Problem: Skin Integrity: Goal: Risk for impaired skin integrity will decrease Outcome: Progressing   Problem: Tissue Perfusion: Goal: Adequacy of tissue perfusion will improve Outcome: Progressing

## 2024-03-29 NOTE — Care Management Important Message (Signed)
 Important Message  Patient Details  Name: Randy Hebert MRN: [id number] Date of Birth: Jun 27, 1966   Important Message Given:  Yes - Medicare IM     Claretta Deed 03/29/2024, 3:31 PM

## 2024-03-29 NOTE — Discharge Summary (Signed)
 Physician Discharge Summary   Patient: Randy Hebert MRN: [id number] DOB: [birth year]  Admit date:     03/24/2024  Discharge date: 03/29/24  Discharge Physician: Elidia Sieving Izaia Say   PCP: Cook, Jayce G, DO   Recommendations at discharge:    Patient was placed on aspirin  and clopidogrel  for one month, then continue with indefinite aspirin  81 mg daily.  Statin dose increased to atorvastatin  80 mg po daily.  Follow up with Dr Bluford in 7 to 10 days  Follow up with Vascular surgery as scheduled.   Discharge Diagnoses: Principal Problem:   Aneurysm of axillary artery (HCC) Active Problems:   Essential hypertension   Hypokalemia   Type 2 diabetes mellitus with hyperlipidemia (HCC)   CAD (coronary artery disease)   Asthmatic bronchitis , chronic (HCC)   Acute postoperative anemia due to greater than expected blood loss   Depression   Obesity, class 1  Resolved Problems:   * No resolved hospital problems. [facility] Course: Randy Hebert was admitted to the hospital with the working diagnosis of recurrent left axillary pseudoaneurysm, requiring stenting and hematoma drainage.   58 yo male with past medical history of diabetes mellitus type 2, hypertension, CAD, liver cirrhosis, anxiety, depression, OSA, CVA w/ left-sided weakness who had a recent hospitalization 7/11 to 03/19/24 post traumatic left shoulder dislocation, complicated with left axillary hematoma with left axillary pseudoaneurysm. On 07/12 he underwent open exposure and repair of the left axillary pseudoaneurysm and evacuation of left axillary hematoma.  07/24 [facility] with acute worsening left axillary pain along with decreased left upper extremity mobility due to pain. His blood pressure was 135/92, HR 106, RR 16 and 02 saturation 92%.  Lungs with no wheezing or rales, heart with S1 and S2 present and regular, abdomen with no distention and no lower extremity edema.  Left upper extremity with edema.   Positive large ecchymosis upper chest and left upper extremity.   Na 137, K 3,4 Cl 102 bicarbonate 19 glucose 180 bun 7 cr 0,63  AST 47 ALT 38  High sensitive troponin 14 and 11  Wbc 11,6 hgb 12,7 plt 362   Ct angiography left upper extremity with 1,8 to 1.5 cm pseudoaneurysm arising from the posterior wall of the left axillary artery, with a 1,3 cm length contrast tract from the posterior wall into the pseudoaneurysm.  1,8 to 1.1 cm contrast pocket in the posterior aspect of the hematoma.  0.1 to 6.9 x 9.7 hematoma in the left axillary space,  5.6 to 3.9 cm hematoma in the left subscapularis space.  5.1 to 3,8 hematoma in the left triceps muscle.  Non displaced recent fractures of the anterolateral left sixth, seven and eights ribs.   CT chest with enlarging left axillary hematoma with arterial contrast extravasation from the posterior wall of thel left axillary artery into a 1.5 to 1,8 cm pseudoaneurysm, with additional 1,8 to 1,1 cm pocket of contrast more posteriorly within the collection.   EKG 98 bpm, right axis deviation, right bundle branch block, qtc 513, sinus rhythm with 1st degree AV block, no significant ST segment or T wave changes.   Patient was transferred to [facility] under vascular surgery service.   07/24 patient underwent: 1) ultrasound guided right common femoral artery access  2) arch aortogram 3) Left upper extremity angiogram with first order cannulation  4) Left axillary artery angioplasty and stenting 5) Incision and drainage of left axillary hematoma   07/26 transfer to [facility]. Drain  was removed.  07/27 hgb stable with no indication for PRBC transfusion, pain controlled with analgesics.  Left axillary wound culture positive for enterococcus fecalis, started on antibiotic therapy with IV Zosyn .  07/28 consulted ID for possible outpatient IV antibiotic therapy 07/29 CT abdomen and pelvis with no deep infection, patient will be treated with po antibiotic and will have  follow up as outpatient.   Assessment and Plan: * Aneurysm of axillary artery (HCC) Plan to continue with aspirin , clopidogrel  and statin . Wound drain removed 07/26   Follow up on post op care from vascular surgery.  Positive culture from axillary wound (surgical culture), for enterococcus fecalis and constellatus.  Sensitive to ampicillin  and penicillin.  Blood cultures with no growth.  Wbc is 7.0 at the time of discharge and he has been afebrile.   CT abdomen and pelvis with no acute findings in the abdomen or pelvis.  Changes of cirrhosis, with associated splenomegaly. Aortic atherosclerosis.   ID was consulted and recommended antibiotic therapy with Augmentin  for 6 weeks.  Follow up as outpatient.   Essential hypertension Continue blood pressure control with amlodipine .    Hypokalemia Stable renal function with serum cr at 0,62 with K at 3,7 and serum bicarbonate at 23  Na 137   Type 2 diabetes mellitus with hyperlipidemia (HCC) Patient was placed on insulin  sliding scale for glucose cover and monitoring. Continue with metformin  and SGLT 2 inh.   Continue ezetimibe  and statin   CAD (coronary artery disease) Continue aspirin , clopidgrel and statin   Asthmatic bronchitis , chronic (HCC) No signs of acute exacerbation  Acute postoperative anemia due to greater than expected blood loss Hgb today is 11.0 no current indication for PRBC transfusion Iron stores with no deficiency.   Depression Continue with citalopram   Obesity, class 1 Calculated BMI 31.6       Consultants: vascular surgery, ID  Procedures performed:   1) Left axillary artery angioplasty and stenting 2) Incision and drainage of left axillary hematoma  Disposition: Home Diet recommendation:  Cardiac and Carb modified diet DISCHARGE MEDICATION: Allergies as of 03/29/2024       Reactions   Coreg  [carvedilol ] Other (See Comments)   Sinus pause on telemetry >3 seconds. Longest one 9 sec. No AV  nodal agent   Zestril  [lisinopril ] Anaphylaxis, Shortness Of Breath, Swelling   Angioedema, required intubation and mechanical ventilation   Chantix  [varenicline ] Other (See Comments)   Nightmares Insomnia    Amoxil  [amoxicillin ] Nausea Only        Medication List     STOP taking these medications    glipiZIDE  5 MG 24 hr tablet Commonly known as: GLUCOTROL  XL   moxifloxacin 0.5 % ophthalmic solution Commonly known as: VIGAMOX   oxyCODONE -acetaminophen  5-325 MG tablet Commonly known as: PERCOCET/ROXICET       TAKE these medications    acetaminophen  500 MG tablet Commonly known as: TYLENOL  Take 2 tablets (1,000 mg total) by mouth every 8 (eight) hours as needed for mild pain or fever.   amLODipine  10 MG tablet Commonly known as: NORVASC  Take 1 tablet (10 mg total) by mouth daily.   amoxicillin -clavulanate 875-125 MG tablet Commonly known as: AUGMENTIN  Take 1 tablet by mouth every 12 (twelve) hours.   aspirin  EC 81 MG tablet Take 1 tablet (81 mg total) by mouth daily. Swallow whole.   atorvastatin  80 MG tablet Commonly known as: LIPITOR  Take 1 tablet (80 mg total) by mouth daily. What changed:  medication strength how much to  take   citalopram  20 MG tablet Commonly known as: CELEXA  Take 1 tablet (20 mg total) by mouth daily.   clopidogrel  75 MG tablet Commonly known as: PLAVIX  Take 1 tablet (75 mg total) by mouth daily. Start taking on: March 30, 2024   dapagliflozin  propanediol 10 MG Tabs tablet Commonly known as: Farxiga  Take 1 tablet (10 mg total) by mouth daily.   ezetimibe  10 MG tablet Commonly known as: Zetia  Take 1 tablet (10 mg total) by mouth daily.   furosemide  40 MG tablet Commonly known as: LASIX  Take 40 mg by mouth daily as needed (excessive swelling).   IRON PO Take 1 tablet by mouth daily.   MAGNESIUM  PO Take 1 tablet by mouth 2 (two) times daily.   metFORMIN  500 MG tablet Commonly known as: GLUCOPHAGE  Take 1 tablet (500 mg  total) by mouth 2 (two) times daily with a meal.   naproxen  375 MG tablet Commonly known as: NAPROSYN  Take 1 tablet (375 mg total) by mouth 2 (two) times daily as needed for moderate pain (pain score 4-6).   nitroGLYCERIN  0.4 MG SL tablet Commonly known as: NITROSTAT  Place 1 tablet (0.4 mg total) under the tongue every 5 (five) minutes x 3 doses as needed (if no relief after 3rd dose, proceed to ED or call 911).   oxyCODONE  5 MG immediate release tablet Commonly known as: Oxy IR/ROXICODONE  Take 1 tablet (5 mg total) by mouth every 6 (six) hours as needed for severe pain (pain score 7-10).   pantoprazole  40 MG tablet Commonly known as: PROTONIX  TAKE (1) TABLET BY MOUTH TWICE DAILY BEFORE A MEAL.   Restasis  0.05 % ophthalmic emulsion Generic drug: cycloSPORINE  Place 1 drop into both eyes 2 (two) times daily.   Symbicort  160-4.5 MCG/ACT inhaler Generic drug: budesonide -formoterol  INHALE 2 PUFFS INTO THE LUNGS FIRST THING IN THE MORNING AND 2 PUFFS ABOUT 12 HOURS LATER.   Trulicity  1.5 MG/0.5ML Soaj Generic drug: Dulaglutide  Inject 1.5 mg into the skin once a week.   VITAMIN C  PO Take 1 tablet by mouth daily.   VITAMIN D -3 PO Take 1 capsule by mouth daily.               Discharge Care Instructions  (From admission, onward)           Start     Ordered   03/29/24 0000  Discharge wound care:       Comments: Per vascular surgery instructions.   03/29/24 1138            Follow-up Information     [facility] Follow up on 04/11/2024.   Specialty: Vascular Surgery Why: Office will call you to arrange your appt (sent). Contact information: [address] [place]  [address] [phone]               Discharge Exam: Filed Weights   03/24/24 0812  Weight: 86.2 kg   BP 135/78 (BP Location: Right Arm)   Pulse 89   Temp 98 F (36.7 C) (Oral)   Resp (!) 21   Ht 5'  5 (1.651 m)   Wt 86.2 kg   SpO2 92%   BMI 31.62 kg/m   Patient is feeling better, no chest pain and no dyspnea, no left arm pain.   Neurology awake and alert ENT with mild pallor Cardiovascular with S1 and S2 present and regular with no gallops, rubs  or murmurs Respiratory with no rales or wheezing, no rhonchi  Abdomen with no distention  No lower extremity edema Left upper extremity with non pitting edema, mild  Left axillary wound with staples in place. Clean with no drainage or bleeding.  Positive chest and abdominal ecchymosis.   Condition at discharge: stable  The results of significant diagnostics from this hospitalization (including imaging, microbiology, ancillary and laboratory) are listed below for reference.   Imaging Studies: CT ABDOMEN PELVIS W CONTRAST Result Date: 03/28/2024 CLINICAL DATA:  Concern for abscess in setting of Strep Constillatis bacteremia EXAM: CT ABDOMEN AND PELVIS WITH CONTRAST TECHNIQUE: Multidetector CT imaging of the abdomen and pelvis was performed using the standard protocol following bolus administration of intravenous contrast. RADIATION DOSE REDUCTION: This exam was performed according to the departmental dose-optimization program which includes automated exposure control, adjustment of the mA and/or kV according to patient size and/or use of iterative reconstruction technique. CONTRAST:  OMNIPAQUE  IOHEXOL  350 MG/ML SOLN COMPARISON:  03/09/2019 FINDINGS: Lower chest: No acute abnormality Hepatobiliary: Nodular contours within the liver compatible with cirrhosis. No focal hepatic abnormality. Gallbladder unremarkable. Pancreas: No focal abnormality or ductal dilatation. Spleen: Splenomegaly with a craniocaudal length of 15 cm. Adrenals/Urinary Tract: No adrenal abnormality. No focal renal abnormality. No stones or hydronephrosis. Urinary bladder is unremarkable. Stomach/Bowel: Stomach, large and small bowel grossly unremarkable. Appendix normal.  Vascular/Lymphatic: Aortic atherosclerosis. No evidence of aneurysm or adenopathy. Reproductive: Peripheral calcifications.  Normal size. Other: None Musculoskeletal: No acute bony abnormality. Degenerative disc and facet disease throughout the lumbar spine. Prior right hip replacement. IMPRESSION: No acute findings in the abdomen or pelvis. Changes of cirrhosis.  Associated splenomegaly. Aortic atherosclerosis. Electronically Signed   By: Franky Crease M.D.   On: 03/28/2024 22:28   HYBRID OR IMAGING ([facility] ONLY) Result Date: 03/24/2024 There is no interpretation for this exam.  This order is for images obtained during a surgical procedure.  Please See Surgeries Tab for more information regarding the procedure.   CT ANGIO UP EXTREM LEFT W &/OR WO CONTAST Result Date: 03/24/2024 CLINICAL DATA:  Enlarging left axillary hematoma following vascular repair of the left axillary artery on 03/11/2024. EXAM: CT ANGIOGRAPHY OF THE LEFT UPPEREXTREMITY TECHNIQUE: Multidetector CT imaging of the left upper extremitywas performed using the standard protocol during bolus administration of intravenous contrast. Multiplanar CT image reconstructions and MIPs were obtained to evaluate the vascular anatomy. RADIATION DOSE REDUCTION: This exam was performed according to the departmental dose-optimization program which includes automated exposure control, adjustment of the mA and/or kV according to patient size and/or use of iterative reconstruction technique. CONTRAST:  OMNIPAQUE  IOHEXOL  350 MG/ML SOLN COMPARISON:  Similar study 03/11/2024 prior to surgery. FINDINGS: Vascular: The left axillary artery again noted 40-50% narrowed due to extrinsic compression by left axillary hematoma. The left axillary vein appears to be occluded by this mass effect. I am unsure the relationship of this hematoma to the rotator cuff or other musculature. Possible this could in part be related to the subscapularis. In total the hematoma measures  9.1 x 6.9 x 9.7 cm. Was previously 6.7 x 5.3 x 6.9 cm. There is confirmed patency of the left subclavian artery on the chest CT today. Left axillary artery is widely patent except where narrowed by mass effect from the hematoma. The brachial, radial and ulnar arteries are widely patent. There is arterial contrast extravasation along a 1.3 cm length contrast tract from the posterior left axillary arterial wall into a 1.8 x 1.5 cm  pseudoaneurysm on 7:55. There is a contrast pocket thinly connected to this in the posterior aspect of the hematoma measuring 1.8 x 1.1 cm on 7:49 and 50. Left subscapularis space hematoma is also larger than previously, today 5.6 x 3.9 cm on 7:52, but there is no focal contrast blush within this. There is new ill-defined fullness in the triceps muscle concerning for hematoma and measuring 5.1 x 3.8 cm on 7:59. Infectious process is not strictly excluded but there is no soft tissue gas. Nonvascular: Glenohumeral and AC joint arthrosis. Os acromiale again noted. The increased low-density effusion in the left glenohumeral joint. There is increased venous collateral flow in the left chest wall and upper extremity. Interval increased diffuse moderate edema in the left upper extremity is additionally noted but no localizing subcutaneous collections. Surgical clips underlie the hematoma in the left axillary space and there are skin staples over the lateral left upper chest wall, old loop recorder device medial left chest wall. There is a thin walled fluid collection underlying the skin staples and measuring 4.5 x 4.2 cm on 7:59, 20 Hounsfield units and most likely a seroma. As described previously there is marked chronic atrophy in the supraspinatus and infraspinatus muscles and wedge-shaped atrophy in the anterior aspect of the biceps. There is additional atrophy in the subscapularis which was better seen on the prior study given the increased hematoma in the area. The only rotator cuff muscle  with a normal bulk is the teres minor. Seen previously, there are nondisplaced recent fractures of the anterolateral left sixth, seventh and eighth ribs. Review of the MIP images confirms the above findings. IMPRESSION: 1. 1.8 x 1.5 cm pseudoaneurysm arising from in the posterior wall of the left axillary artery, with a 1.3 cm length contrast tract from the posterior wall into the pseudoaneurysm. 2. 1.8 x 1.1 cm contrast pocket in the posterior aspect of the hematoma. 3. 9.1 x 6.9 x 9.7 cm hematoma in the left axillary space, previously 6.7 x 5.3 x 6.9 cm. I am unsure the relationship of this hematoma to the rotator cuff or other musculature. Possible this could in part be related to the subscapularis. 4. 5.6 x 3.9 cm hematoma in the left subscapularis space, larger than previously, but no focal contrast blush within this. 5. 5.1 x 3.8 cm new likely hematoma in the left triceps muscle. Infectious process is not strictly excluded but there is no soft tissue gas. 6. Increased diffuse edema in the left upper extremity. 7. 4.5 x 4.2 cm thin walled fluid collection underlying the skin staples in the lateral left upper chest wall, most likely a seroma. 8. Increased low-density effusion in the left glenohumeral joint. 9. Increased venous collateral flow in the left chest wall and upper extremity. 10. Again noted nondisplaced recent fractures of the anterolateral left sixth, seventh and eighth ribs. Electronically Signed   By: Francis Quam M.D.   On: 03/24/2024 05:58   CT Chest W Contrast Result Date: 03/24/2024 CLINICAL DATA:  Blunt chest trauma. Shoulder trauma. Vascular compromise possible. Underwent surgery July 11 for left axillary artery repair. Pain and swelling left shoulder today. EXAM: CT CHEST WITH CONTRAST TECHNIQUE: Multidetector CT imaging of the chest was performed during intravenous contrast administration. RADIATION DOSE REDUCTION: This exam was performed according to the departmental dose-optimization  program which includes automated exposure control, adjustment of the mA and/or kV according to patient size and/or use of iterative reconstruction technique. CONTRAST:  OMNIPAQUE  IOHEXOL  350 MG/ML SOLN COMPARISON:  Portable  chest 03/12/2019, CT left shoulder 03/11/2024, CT chest without contrast 10/23/2023 and 04/20/2023. FINDINGS: Cardiovascular: The heart is slightly enlarged. Coronary arteries are heavily calcified. Left chambers are larger than the right. There is an enlarged pulmonary trunk 3.3 cm indicating arterial hypertension. No central embolus is seen. There is a right-sided aortic arch, with atherosclerosis in the aorta and great vessels. An enlarging left axillary hematoma is noted, the visualized portion measuring 9.1 x 7.8 cm on 4:26. The left axillary artery is at the anterior aspect of the hematoma with arterial contrast extravasation from the posterior wall into a pseudoaneurysm measuring 1.5 x 1.8 cm on 4:30, with additional, apparently connected pocket of contrast more posteriorly measuring 1.8 x 1.1 cm within the collection on 4:30. No other significant vascular findings. There is increased heterogeneous hematoma in the subscapularis region measuring 7.3 x 4.8 cm on 4:30, but no focal contrast is seen within this. Mediastinum/Nodes: No lymphadenopathy or thyroid  mass is seen. Negative thoracic esophagus thoracic trachea. Lungs/Pleura: Right apical fiducial markers and post XRT changes are similar in appearance. Treated nodule just above the fiducial markers today measures 8 mm, previously 9 mm. Centrilobular emphysematous disease and scarring changes are again noted. No new nodule is evident. There is no pleural effusion or infiltrate. Upper Abdomen: Cirrhosis and mild steatosis of the liver. Unchanged splenomegaly with AP dimension 13.8 cm. Prominence of the hepatic portal vein measuring 17 mm. No acute upper abdominal findings. Musculoskeletal: There is thoracic spondylosis, bridging  enthesopathy and advanced degenerative disc disease. No regional fracture. There are no surgical clips underlying the left axillary hematoma. DJD both shoulders. IMPRESSION: 1. Enlarging left axillary hematoma with arterial contrast extravasation from the posterior wall of the left axillary artery into a 1.5 x 1.8 cm pseudoaneurysm, with additional 1.8 x 1.1 cm pocket of contrast more posteriorly within the collection. 2. Increased heterogeneous hematoma in the left subscapularis region measuring 7.3 x 4.8 cm, but no focal contrast is seen within this. 3. Emphysema. 4. Right apical fiducial markers and post XRT changes, with treated nodule just above the fiducial markers today measuring 8 mm, previously 9 mm. 5. Aortic and coronary artery atherosclerosis.  Right aortic arch. 6. Enlarged pulmonary trunk 3.3 cm indicating arterial hypertension. 7. Cirrhosis and splenomegaly.  Prominent patent portal vein 1.7 cm. 8. Critical Value/emergent results were called by telephone at the time of interpretation on 03/24/2024 at 5:18 am to provider [facility] , who verbally acknowledged these results. Aortic Atherosclerosis (ICD10-I70.0) and Emphysema (ICD10-J43.9). Electronically Signed   By: Francis Quam M.D.   On: 03/24/2024 05:26   DG Shoulder Left Port Result Date: 03/16/2024 CLINICAL DATA:  Dislocation EXAM: LEFT SHOULDER COMPARISON:  Plain films and CT 03/11/2024 FINDINGS: No acute bony abnormality. Specifically, no fracture, subluxation, or dislocation. Degenerative changes in the left AC and glenohumeral joints. Postoperative changes in the left axilla with skin staples and surgical drain in place. IMPRESSION: No acute bony abnormality. Postoperative changes in the left axilla. Electronically Signed   By: Franky Crease M.D.   On: 03/16/2024 11:30   CT ANGIO UP EXTREM LEFT W &/OR WO CONTAST Result Date: 03/11/2024 CLINICAL DATA:  Recent shoulder dislocation with surrounding muscular hematoma, initial encounter  EXAM: CT ANGIOGRAPHY OF THE LEFT UPPEREXTREMITY TECHNIQUE: Multidetector CT imaging of the left upper extremitywas performed using the standard protocol during bolus administration of intravenous contrast. Multiplanar CT image reconstructions and MIPs were obtained to evaluate the vascular anatomy. RADIATION DOSE REDUCTION: This exam was performed according  to the departmental dose-optimization program which includes automated exposure control, adjustment of the mA and/or kV according to patient size and/or use of iterative reconstruction technique. CONTRAST:  75mL OMNIPAQUE  IOHEXOL  350 MG/ML SOLN COMPARISON:  CT of the shoulder from earlier in the same day. FINDINGS: Vascular: Right aortic arch is noted. The subclavian artery is patent throughout its course. The axillary artery is somewhat attenuated secondary to extrinsic compression by the muscular hematoma seen previously. The more distal brachial radial and ulnar arteries are patent. The previously described intramuscular hematoma is again seen within the subscapularis. The soft tissue hematoma in the left axilla is again identified as well with central extravasation identified. Given the lack of venous opacification on the left, this likely represents a small arterial hemorrhage. The overall hematoma measures approximately 6.7 x 5.3 cm in greatest transverse and AP dimensions not significantly changed from the prior exam. Attenuation of the lateral thoracic artery is noted as well related to extrinsic compression by the hematoma. Some mild subcutaneous extension of hemorrhage along the lateral chest wall is again seen similar to that noted on the prior exam. Nonvascular: Significant degenerative changes of the glenohumeral and acromioclavicular joints are seen. No definitive rib fracture is identified. Visualized structures in the chest and abdomen on the margin of the scan appear within normal limits. Review of the MIP images confirms the above findings.  IMPRESSION: Attenuation of the left axillary artery secondary to extrinsic compression. No definitive dissection is identified. Active hemorrhage is noted within the left axillary hematoma likely related to a small branch of the lateral thoracic artery beneath the axillary hematoma. This is best visualized on the coronal imaging (image number 62 of series 10). Critical Value/emergent results were called by telephone at the time of interpretation on 03/11/2024 at 9:48 pm to Dr. SID BONING , who verbally acknowledged these results. Electronically Signed   By: Oneil Devonshire M.D.   On: 03/11/2024 21:51   CT Shoulder Left Wo Contrast Result Date: 03/11/2024 CLINICAL DATA:  Shoulder trauma, fracture of humerus or scapula. Fall, left shoulder injury EXAM: CT OF THE UPPER LEFT EXTREMITY WITHOUT CONTRAST TECHNIQUE: Multidetector CT imaging of the upper left extremity was performed according to the standard protocol. RADIATION DOSE REDUCTION: This exam was performed according to the departmental dose-optimization program which includes automated exposure control, adjustment of the mA and/or kV according to patient size and/or use of iterative reconstruction technique. COMPARISON:  None Available. FINDINGS: Bones/Joint/Cartilage Normal alignment. No acute fracture or dislocation. Mild acromioclavicular and moderate glenohumeral degenerative arthritis. Degenerative changes are seen within the cervical spine, not optimally profiled on this examination, but with advanced degenerative disc disease at C4-T1 with severe left neuroforaminal narrowing at these levels. Ligaments Suboptimally assessed by CT. Muscles and Tendons There is marked fatty atrophy of the supraspinatus and infraspinatus musculature. Moderate fatty atrophy of the subscapularis musculature. There is hyperdensity within the residual muscle fibers of the subscapularis with associated thickening, best appreciated on 55/8 and 59/10 in keeping with an intramuscular  hematoma. There is a lobulated hyperdense collection noted within the interstitial soft tissues of the left axilla measuring roughly 5.4 x 6.0 x 9.5 cm (84/10, 56/8) in keeping with a acute hematoma. There is interstitial hemorrhage noted surrounding this within the left axilla seen tracking into the subclavicular region and inferiorly along the left chest wall. Soft tissues See above.  Left shoulder effusion is present. IMPRESSION: 1. No acute fracture or dislocation. 2. Moderate glenohumeral degenerative arthritis. 3. Marked fatty atrophy  of the supraspinatus and infraspinatus musculature. Moderate fatty atrophy of the subscapularis musculature. 4. Intramuscular hematoma within the residual muscle fibers of the subscapularis. 5. Acute hematoma within the left axilla measuring roughly 5.4 x 6.0 x 9.5 cm. Surrounding interstitial hemorrhage with left axilla and chest wall. 6. Extensive degenerative disc disease at C4-T1 with severe left neuroforaminal narrowing at these levels. Electronically Signed   By: Dorethia Molt M.D.   On: 03/11/2024 19:17   DG Chest Portable 1 View Result Date: 03/11/2024 CLINICAL DATA:  Shortness of breath EXAM: PORTABLE CHEST 1 VIEW COMPARISON:  Chest radiograph dated 03/24/2023 FINDINGS: Implanted loop recorder projects over the medial left lung base. Metallic clip projects over the right apex. Normal lung volumes. Left basilar patchy opacities. No pleural effusion or pneumothorax. Right aortic arch. Similar enlarged cardiomediastinal silhouette. No acute osseous abnormality. IMPRESSION: 1. Left basilar patchy opacities, which may represent atelectasis, aspiration, or pneumonia. 2. Similar enlarged cardiomediastinal silhouette. 3. Right aortic arch. Electronically Signed   By: Limin  Xu M.D.   On: 03/11/2024 18:57   DG Shoulder Left Result Date: 03/11/2024 CLINICAL DATA:  Tripped over screw in apartment and landed on left shoulder. EXAM: LEFT SHOULDER - 2+ VIEW COMPARISON:   Same-day radiograph at 2:57 p.m. FINDINGS: Successful reduction of the left shoulder dislocation. Curvilinear lucency along the inferior glenoid suspicious for nondisplaced fracture. Degenerative changes AC and glenohumeral joints. IMPRESSION: 1. Successful reduction of the left glenohumeral dislocation. 2. Curvilinear lucency along the inferior glenoid suspicious for nondisplaced fracture. CT is recommended for further evaluation. Electronically Signed   By: Norman Gatlin M.D.   On: 03/11/2024 17:38   DG Shoulder Left Result Date: 03/11/2024 CLINICAL DATA:  Left shoulder pain following a fall. EXAM: LEFT SHOULDER - 2+ VIEW COMPARISON:  None Available. FINDINGS: Anterior, inferior dislocation of the humeral head relative to the acromion. Moderate humeral head and neck junction spur formation. No fracture seen. IMPRESSION: Anterior, inferior dislocation of the humeral head. Electronically Signed   By: Elspeth Bathe M.D.   On: 03/11/2024 15:28   ECHOCARDIOGRAM COMPLETE BUBBLE STUDY Result Date: 03/03/2024    ECHOCARDIOGRAM REPORT   Patient Name:   ROCCO KERKHOFF Date of Exam: 03/03/2024 Medical Rec #:  [id number]        Height:       65.0 in Accession #:    [id number]       Weight:       193.8 lb Date of Birth:  1966-06-11        BSA:          1.952 m Patient Age:    58 years         BP:           126/85 mmHg Patient Gender: M                HR:           81 bpm. Exam Location:  [facility] Procedure: 2D Echo, Cardiac Doppler, Color Doppler, Saline Contrast Bubble Study            and Intracardiac Opacification Agent (Both Spectral and Color Flow            Doppler were utilized during procedure). Indications:    Stroke l63.9  History:        Patient has prior history of Echocardiogram examinations, most                 recent 08/05/2022. CHF, CAD and  Previous Myocardial Infarction,                 COPD; Risk Factors:Hypertension, Diabetes, Dyslipidemia and                 Current Smoker.  Sonographer:    Aida Pizza RCS Referring Phys: [id number] JAN A MANSY IMPRESSIONS  1. Left ventricular ejection fraction, by estimation, is 45 to 50%. The left ventricle has mildly decreased function. The left ventricle demonstrates regional wall motion abnormalities (see scoring diagram/findings for description). Left ventricular diastolic parameters are indeterminate.  2. No LV mural thrombus noted with Definity  contrast.  3. Right ventricular systolic function is normal. The right ventricular size is normal. Tricuspid regurgitation signal is inadequate for assessing PA pressure.  4. Left atrial size was mildly dilated.  5. The mitral valve is degenerative. Trivial mitral valve regurgitation.  6. The aortic valve is tricuspid. There is mild calcification of the aortic valve. Aortic valve regurgitation is not visualized. Aortic valve sclerosis is present, with no evidence of aortic valve stenosis.  7. The inferior vena cava is normal in size with greater than 50% respiratory variability, suggesting right atrial pressure of 3 mmHg.  8. Agitated saline contrast bubble study was negative, with no evidence of any interatrial shunt. Comparison(s): Prior images reviewed side by side. LVEF is relatively stable on review of the last few studies. FINDINGS  Left Ventricle: Left ventricular ejection fraction, by estimation, is 45 to 50%. The left ventricle has mildly decreased function. The left ventricle demonstrates regional wall motion abnormalities. Definity  contrast agent was given IV to delineate the left ventricular endocardial borders. The left ventricular internal cavity size was normal in size. There is no left ventricular hypertrophy. Left ventricular diastolic parameters are indeterminate.  LV Wall Scoring: The antero-lateral wall, inferior wall, and posterior wall are hypokinetic. The entire anterior wall, entire septum, and entire apex are normal. Right Ventricle: The right ventricular size is normal. No increase in right ventricular  wall thickness. Right ventricular systolic function is normal. Tricuspid regurgitation signal is inadequate for assessing PA pressure. Left Atrium: Left atrial size was mildly dilated. Right Atrium: Right atrial size was normal in size. Pericardium: There is no evidence of pericardial effusion. Mitral Valve: The mitral valve is degenerative in appearance. Trivial mitral valve regurgitation. Tricuspid Valve: The tricuspid valve is grossly normal. Tricuspid valve regurgitation is trivial. Aortic Valve: The aortic valve is tricuspid. There is mild calcification of the aortic valve. Aortic valve regurgitation is not visualized. Aortic valve sclerosis is present, with no evidence of aortic valve stenosis. Pulmonic Valve: The pulmonic valve was grossly normal. Pulmonic valve regurgitation is trivial. Aorta: The aortic root is normal in size and structure. Venous: The inferior vena cava is normal in size with greater than 50% respiratory variability, suggesting right atrial pressure of 3 mmHg. IAS/Shunts: No atrial level shunt detected by color flow Doppler. Agitated saline contrast was given intravenously to evaluate for intracardiac shunting. Agitated saline contrast bubble study was negative, with no evidence of any interatrial shunt. Additional Comments: 3D was performed not requiring image post processing on an independent workstation and was indeterminate.  LEFT VENTRICLE PLAX 2D LVIDd:         4.80 cm   Diastology LVIDs:         3.60 cm   LV e' medial:    5.77 cm/s LV PW:         1.00 cm   LV E/e' medial:  12.2 LV  IVS:        1.00 cm   LV e' lateral:   6.74 cm/s LVOT diam:     2.20 cm   LV E/e' lateral: 10.5 LV SV:         94 LV SV Index:   48 LVOT Area:     3.80 cm  RIGHT VENTRICLE RV S prime:     12.20 cm/s TAPSE (M-mode): 2.2 cm LEFT ATRIUM             Index        RIGHT ATRIUM           Index LA diam:        3.70 cm 1.90 cm/m   RA Area:     20.40 cm LA Vol (A2C):   45.2 ml 23.16 ml/m  RA Volume:   58.90 ml   30.18 ml/m LA Vol (A4C):   66.1 ml 33.87 ml/m LA Biplane Vol: 55.5 ml 28.44 ml/m  AORTIC VALVE LVOT Vmax:   130.00 cm/s LVOT Vmean:  85.100 cm/s LVOT VTI:    0.247 m  AORTA Ao Root diam: 3.70 cm MITRAL VALVE MV Area (PHT): 3.77 cm    SHUNTS MV Decel Time: 201 msec    Systemic VTI:  0.25 m MV E velocity: 70.60 cm/s  Systemic Diam: 2.20 cm MV A velocity: 76.50 cm/s MV E/A ratio:  0.92 Jayson Sierras MD Electronically signed by Jayson Sierras MD Signature Date/Time: 03/03/2024/11:52:58 AM    Final    CT ANGIO HEAD NECK W WO CM Result Date: 03/02/2024 CLINICAL DATA:  Stroke/TIA, determine embolic source EXAM: CT ANGIOGRAPHY HEAD AND NECK WITH AND WITHOUT CONTRAST TECHNIQUE: Multidetector CT imaging of the head and neck was performed using the standard protocol during bolus administration of intravenous contrast. Multiplanar CT image reconstructions and MIPs were obtained to evaluate the vascular anatomy. Carotid stenosis measurements (when applicable) are obtained utilizing NASCET criteria, using the distal internal carotid diameter as the denominator. RADIATION DOSE REDUCTION: This exam was performed according to the departmental dose-optimization program which includes automated exposure control, adjustment of the mA and/or kV according to patient size and/or use of iterative reconstruction technique. CONTRAST:  75mL OMNIPAQUE  IOHEXOL  350 MG/ML SOLN COMPARISON:  Same day MRI head. FINDINGS: CT HEAD FINDINGS Brain: Known acute infarct better characterized on recent MRI. No evidence of progressive mass effect or acute hemorrhage. No evidence of mass lesion, midline shift or hydrocephalus. Vascular: See below. Skull: No acute fracture. Sinuses/Orbits: Clear sinuses.  No acute orbital findings. Other: No mastoid effusions. Soft tissue thickening of the ears/pinna bilaterally with areas of calcification, which is chronic. Review of the MIP images confirms the above findings CTA NECK FINDINGS Aortic arch: Great vessel  origins are patent without significant stenosis. Right-sided aortic arch. Aortic atherosclerosis. Right carotid system: No evidence of dissection, stenosis (50% or greater), or occlusion. Left carotid system: No evidence of dissection, stenosis (50% or greater), or occlusion. Vertebral arteries: Left dominant. No evidence of dissection, stenosis (50% or greater), or occlusion. Skeleton: Severe multilevel degenerative change. Other neck: No evidence of acute abnormality on limited assessment. Upper chest: Visualized lung apices are clear. Review of the MIP images confirms the above findings CTA HEAD FINDINGS Anterior circulation: Bilateral intracranial ICAs, MCAs, and ACAs are patent without proximal hemodynamically significant stenosis. Posterior circulation: Bilateral intradural vertebral arteries, basilar artery and bilateral posterior cerebral arteries are patent without proximal hemodynamically significant stenosis. Venous sinuses: As permitted by contrast timing, patent. Review of the  MIP images confirms the above findings IMPRESSION: 1. No emergent large vessel occlusion or proximal hemodynamically significant stenosis. 2. Right-sided aortic arch and aortic atherosclerosis (ICD10-I70.0). Electronically Signed   By: Gilmore GORMAN Molt M.D.   On: 03/02/2024 22:36   MR Brain Wo Contrast Result Date: 03/02/2024 CLINICAL DATA:  Neuro deficit, acute, stroke suspected left sided weakness, gait instability, dizziness, nausea (started Sunday night) EXAM: MRI HEAD WITHOUT CONTRAST TECHNIQUE: Multiplanar, multiecho pulse sequences of the brain and surrounding structures were obtained without intravenous contrast. COMPARISON:  MRI of the head dated May 07, 2023. FINDINGS: Brain: There is an acute nonhemorrhagic infarct present at the right thalamus capsular junction seen on image 28 of series 5. There is also spurious high signal on the same image within the left frontal cortex, of no significance. There is  age-related cerebral and cerebellar volume loss present. 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 There is no evidence of hemorrhage, mass or hydrocephalus. Vascular: Normal vascular flow voids. Skull and upper cervical spine: Normal marrow signal. No osseous lesions evident. Sinuses/Orbits: Negative. Other: None. IMPRESSION: 1. Acute/subacute lacunar infarct will at the right thalamus capsular junction. 2. These results were called by telephone at the time of interpretation on 03/02/2024 at 6:51 pm to provider Dr. JACQULYN AHLE , who verbally acknowledged these results. Electronically Signed   By: Evalene Coho M.D.   On: 03/02/2024 18:52    Microbiology: Results for orders placed or performed during the hospital encounter of 03/24/24  Surgical pcr screen     Status: None   Collection Time: 03/24/24  7:46 AM   Specimen: Nasal Mucosa; Nasal Swab  Result Value Ref Range Status   MRSA, PCR NEGATIVE NEGATIVE Final   Staphylococcus aureus NEGATIVE NEGATIVE Final    Comment: (NOTE) The Xpert SA Assay (FDA approved for NASAL specimens in patients 28 years of age and older), is one component of a comprehensive surveillance program. It is not intended to diagnose infection nor to guide or monitor treatment. Performed at [facility], [address], [place], [place] [address]   Aerobic/Anaerobic Culture w Gram Stain (surgical/deep wound)     Status: None   Collection Time: 03/24/24 12:23 PM   Specimen: Path fluid; Tissue  Result Value Ref Range Status   Specimen Description WOUND  Final   Special Requests LEFT AXILLA  Final   Gram Stain NO WBC SEEN NO ORGANISMS SEEN   Final   Culture   Final    RARE STREPTOCOCCUS CONSTELLATUS NO ANAEROBES ISOLATED Performed at [facility], [address] [place], [place] [address]    Report Status 03/29/2024 FINAL  Final   Organism ID, Bacteria STREPTOCOCCUS CONSTELLATUS  Final      Susceptibility    Streptococcus constellatus - MIC*    PENICILLIN <=0.06 SENSITIVE Sensitive     CEFTRIAXONE  0.5 SENSITIVE Sensitive     ERYTHROMYCIN >=8 RESISTANT Resistant     LEVOFLOXACIN  <=0.25 SENSITIVE Sensitive     VANCOMYCIN  0.5 SENSITIVE Sensitive     * RARE STREPTOCOCCUS CONSTELLATUS  Aerobic/Anaerobic Culture w Gram Stain (surgical/deep wound)     Status: None   Collection Time: 03/24/24 12:26 PM   Specimen: Path fluid; Tissue  Result Value Ref Range Status   Specimen Description WOUND  Final   Special Requests LEFT INTRACLAVICULAR  Final   Gram Stain NO WBC SEEN NO ORGANISMS SEEN   Final   Culture   Final    RARE ENTEROCOCCUS FAECALIS  NO ANAEROBES ISOLATED Performed at [facility], [address] [place], [place] [address]    Report Status 03/29/2024 FINAL  Final   Organism ID, Bacteria ENTEROCOCCUS FAECALIS  Final      Susceptibility   Enterococcus faecalis - MIC*    AMPICILLIN  <=2 SENSITIVE Sensitive     VANCOMYCIN  1 SENSITIVE Sensitive     GENTAMICIN  SYNERGY RESISTANT Resistant     * RARE ENTEROCOCCUS FAECALIS  Culture, blood (Routine X 2) w Reflex to ID Panel     Status: None (Preliminary result)   Collection Time: 03/27/24  9:03 AM   Specimen: BLOOD RIGHT ARM  Result Value Ref Range Status   Specimen Description BLOOD RIGHT ARM  Final   Special Requests   Final    BOTTLES DRAWN AEROBIC ONLY Blood Culture adequate volume   Culture   Final    NO GROWTH 2 DAYS Performed at [facility], [address] [place], [place] [address]    Report Status PENDING  Incomplete  Culture, blood (Routine X 2) w Reflex to ID Panel     Status: None (Preliminary result)   Collection Time: 03/27/24  9:03 AM   Specimen: BLOOD RIGHT HAND  Result Value Ref Range Status   Specimen Description BLOOD RIGHT HAND  Final   Special Requests   Final    BOTTLES DRAWN AEROBIC ONLY Blood Culture adequate volume   Culture   Final    NO GROWTH 2 DAYS Performed at [facility],  [address], [place], [place] [address]    Report Status PENDING  Incomplete   *Note: Due to a large number of results and/or encounters for the requested time period, some results have not been displayed. A complete set of results can be found in Results Review.    Labs: CBC: Recent Labs  Lab 03/25/24 0309 03/26/24 1059 03/27/24 0356 03/28/24 0329 03/29/24 0302  WBC 12.2* 11.1* 8.2 9.3 7.0  NEUTROABS  --   --   --  7.1 4.9  HGB 9.6* 9.8* 9.5* 9.8* 11.0*  HCT 29.9* 29.9* 29.5* 30.9* 34.2*  MCV 95.8 95.2 94.9 96.9 96.3  PLT 283 282 259 270 307   Basic Metabolic Panel: Recent Labs  Lab 03/24/24 0339 03/24/24 1540 03/25/24 0309 03/27/24 0356 03/28/24 0329  NA 137  --  137 138 137  K 3.4*  --  4.0 3.4* 3.7  CL 102  --  102 103 103  CO2 19*  --  27 24 23   GLUCOSE 180*  --  171* 127* 129*  BUN 7  --  11 10 9   CREATININE 0.63 0.68 0.60* 0.69 0.62  CALCIUM  9.3  --  9.0 8.8* 8.8*   Liver Function Tests: Recent Labs  Lab 03/24/24 0339  AST 47*  ALT 38  ALKPHOS 210*  BILITOT 3.0*  PROT 7.8  ALBUMIN  3.8   CBG: Recent Labs  Lab 03/28/24 0602 03/28/24 1151 03/28/24 1601 03/28/24 2128 03/29/24 0631  GLUCAP 140* 140* 131* 105* 147*    Discharge time spent: greater than 30 minutes.  Signed: Elidia Toribio Furnace, MD [facility] 03/29/2024

## 2024-03-29 NOTE — TOC Transition Note (Signed)
 Transition of Care (TOC) - Discharge Note Rayfield Gobble RN, BSN Inpatient Care Management Unit 4E- RN Case Manager See Treatment Team for direct phone #   Patient Details  Name: Randy Hebert MRN: [id number] Date of Birth: Jan 22, 1966  Transition of Care [facility]) CM/SW Contact:  Gobble Rayfield Hurst, RN Phone Number: 03/29/2024, 12:49 PM   Clinical Narrative:    Pt stable for transition home today, has transportation home.   Note recommendations for outpt PT/OT. MD provided verbal order for outpt referral.   CM spoke with pt at bedside- pt voiced he was interested in outpt therapy- location options provided near his home. Pt has chosen Hospital doctor.  Referral sent to [facility] outpt rehab for PT/OT via epic- Ref# [id number]  No further CM needs noted.    Final next level of care: OP Rehab Barriers to Discharge: No Barriers Identified   Patient Goals and CMS Choice Patient states their goals for this hospitalization and ongoing recovery are:: return home   Choice offered to / list presented to : NA      Discharge Placement               Home        Discharge Plan and Services Additional resources added to the After Visit Summary for     Discharge Planning Services: CM Consult Post Acute Care Choice: NA          DME Arranged: N/A DME Agency: NA       HH Arranged: NA HH Agency: NA        Social Drivers of Health (SDOH) Interventions SDOH Screenings   Food Insecurity: No Food Insecurity (03/24/2024)  Housing: Low Risk  (03/24/2024)  Transportation Needs: No Transportation Needs (03/24/2024)  Utilities: Not At Risk (03/24/2024)  Alcohol  Screen: Medium Risk (01/08/2024)  Depression (PHQ2-9): Low Risk  (03/21/2024)  Financial Resource Strain: Low Risk  (01/08/2024)  Physical Activity: Insufficiently Active (01/08/2024)  Social Connections: Moderately Integrated (03/02/2024)  Stress: No Stress Concern Present (01/08/2024)  Tobacco Use: High Risk  (03/24/2024)  Health Literacy: Adequate Health Literacy (01/08/2024)     Readmission Risk Interventions    03/29/2024   12:49 PM  Readmission Risk Prevention Plan  Transportation Screening Complete  HRI or Home Care Consult Complete  Social Work Consult for Recovery Care Planning/Counseling Complete  Palliative Care Screening Not Applicable  Medication Review Oceanographer) Complete

## 2024-03-29 NOTE — Progress Notes (Signed)
 Mobility Specialist Progress Note;   03/29/24 0920  Mobility  Activity Ambulated independently  Level of Assistance Standby assist, set-up cues, supervision of patient - no hands on  Assistive Device None  Distance Ambulated (ft) 400 ft  LUE Weight Bearing Per Provider Order NWB  Activity Response Tolerated well  Mobility Referral Yes  Mobility visit 1 Mobility  Mobility Specialist Start Time (ACUTE ONLY) 0920  Mobility Specialist Stop Time (ACUTE ONLY) [id number]  Mobility Specialist Time Calculation (min) (ACUTE ONLY) 8 min   Pt received sitting on EoB, eager for mobility. Required no physical assistance during ambulation, SV. HR up to 110 bpm w/ activity. No c/o when asked. Pt returned back to sitting on EoB and left with all needs met, call bell in reach.   Lauraine Erm Mobility Specialist Please contact via SecureChat or [employer] [phone]

## 2024-03-29 NOTE — Plan of Care (Signed)
 Problem: Education: Goal: Knowledge of General Education information will improve Description: Including pain rating scale, medication(s)/side effects and non-pharmacologic comfort measures 03/29/2024 1258 by Gary Michaelis, RN Outcome: Adequate for Discharge 03/29/2024 1257 by Gary Michaelis, RN Outcome: Progressing   Problem: Health Behavior/Discharge Planning: Goal: Ability to manage health-related needs will improve 03/29/2024 1258 by Gary Michaelis, RN Outcome: Adequate for Discharge 03/29/2024 1257 by Gary Michaelis, RN Outcome: Progressing   Problem: Clinical Measurements: Goal: Ability to maintain clinical measurements within normal limits will improve 03/29/2024 1258 by Gary Michaelis, RN Outcome: Adequate for Discharge 03/29/2024 1257 by Gary Michaelis, RN Outcome: Progressing Goal: Will remain free from infection 03/29/2024 1258 by Gary Michaelis, RN Outcome: Adequate for Discharge 03/29/2024 1257 by Gary Michaelis, RN Outcome: Progressing Goal: Diagnostic test results will improve 03/29/2024 1258 by Gary Michaelis, RN Outcome: Adequate for Discharge 03/29/2024 1257 by Gary Michaelis, RN Outcome: Progressing Goal: Respiratory complications will improve 03/29/2024 1258 by Gary Michaelis, RN Outcome: Adequate for Discharge 03/29/2024 1257 by Gary Michaelis, RN Outcome: Progressing Goal: Cardiovascular complication will be avoided 03/29/2024 1258 by Gary Michaelis, RN Outcome: Adequate for Discharge 03/29/2024 1257 by Gary Michaelis, RN Outcome: Progressing   Problem: Activity: Goal: Risk for activity intolerance will decrease 03/29/2024 1258 by Gary Michaelis, RN Outcome: Adequate for Discharge 03/29/2024 1257 by Gary Michaelis, RN Outcome: Progressing   Problem: Nutrition: Goal: Adequate nutrition will be maintained 03/29/2024 1258 by Gary Michaelis, RN Outcome: Adequate for Discharge 03/29/2024 1257 by Gary Michaelis, RN Outcome: Progressing   Problem: Coping: Goal: Level of anxiety will decrease 03/29/2024 1258 by Gary Michaelis, RN Outcome: Adequate for Discharge 03/29/2024 1257 by Gary Michaelis, RN Outcome: Progressing   Problem: Elimination: Goal: Will not experience complications related to bowel motility 03/29/2024 1258 by Gary Michaelis, RN Outcome: Adequate for Discharge 03/29/2024 1257 by Gary Michaelis, RN Outcome: Progressing Goal: Will not experience complications related to urinary retention 03/29/2024 1258 by Gary Michaelis, RN Outcome: Adequate for Discharge 03/29/2024 1257 by Gary Michaelis, RN Outcome: Progressing   Problem: Pain Managment: Goal: General experience of comfort will improve and/or be controlled 03/29/2024 1258 by Gary Michaelis, RN Outcome: Adequate for Discharge 03/29/2024 1257 by Gary Michaelis, RN Outcome: Progressing   Problem: Safety: Goal: Ability to remain free from injury will improve 03/29/2024 1258 by Gary Michaelis, RN Outcome: Adequate for Discharge 03/29/2024 1257 by Gary Michaelis, RN Outcome: Progressing   Problem: Skin Integrity: Goal: Risk for impaired skin integrity will decrease 03/29/2024 1258 by Gary Michaelis, RN Outcome: Adequate for Discharge 03/29/2024 1257 by Gary Michaelis, RN Outcome: Progressing   Problem: Education: Goal: Ability to describe self-care measures that may prevent or decrease complications (Diabetes Survival Skills Education) will improve 03/29/2024 1258 by Gary Michaelis, RN Outcome: Adequate for Discharge 03/29/2024 1257 by Gary Michaelis, RN Outcome: Progressing   Problem: Coping: Goal: Ability to adjust to condition or change in health will improve 03/29/2024 1258 by Gary Michaelis, RN Outcome: Adequate for Discharge 03/29/2024 1257 by Gary Michaelis, RN Outcome: Progressing   Problem: Fluid Volume: Goal: Ability to maintain a balanced intake and  output will improve 03/29/2024 1258 by Gary Michaelis, RN Outcome: Adequate for Discharge 03/29/2024 1257 by Gary Michaelis, RN Outcome: Progressing   Problem: Health Behavior/Discharge Planning: Goal: Ability to identify and utilize available resources and services will improve 03/29/2024 1258 by Gary Michaelis, RN Outcome: Adequate for Discharge 03/29/2024 1257 by Gary Michaelis, RN Outcome: Progressing Goal: Ability to manage health-related needs will improve 03/29/2024 1258 by Gary Michaelis, RN Outcome: Adequate  for Discharge 03/29/2024 1257 by Gary Michaelis, RN Outcome: Progressing   Problem: Metabolic: Goal: Ability to maintain appropriate glucose levels will improve 03/29/2024 1258 by Gary Michaelis, RN Outcome: Adequate for Discharge 03/29/2024 1257 by Gary Michaelis, RN Outcome: Progressing   Problem: Nutritional: Goal: Maintenance of adequate nutrition will improve 03/29/2024 1258 by Gary Michaelis, RN Outcome: Adequate for Discharge 03/29/2024 1257 by Gary Michaelis, RN Outcome: Progressing Goal: Progress toward achieving an optimal weight will improve 03/29/2024 1258 by Gary Michaelis, RN Outcome: Adequate for Discharge 03/29/2024 1257 by Gary Michaelis, RN Outcome: Progressing   Problem: Skin Integrity: Goal: Risk for impaired skin integrity will decrease 03/29/2024 1258 by Gary Michaelis, RN Outcome: Adequate for Discharge 03/29/2024 1257 by Gary Michaelis, RN Outcome: Progressing   Problem: Tissue Perfusion: Goal: Adequacy of tissue perfusion will improve 03/29/2024 1258 by Gary Michaelis, RN Outcome: Adequate for Discharge 03/29/2024 1257 by Gary Michaelis, RN Outcome: Progressing   Problem: Acute Rehab PT Goals(only PT should resolve) Goal: Patient Will Transfer Sit To/From Stand Outcome: Adequate for Discharge Goal: Pt Will Ambulate Outcome: Adequate for Discharge   Problem: Acute Rehab OT Goals (only OT  should resolve) Goal: Pt. Will Perform Grooming Outcome: Adequate for Discharge Goal: Pt. Will Perform Upper Body Dressing Outcome: Adequate for Discharge Goal: Pt. Will Perform Lower Body Dressing Outcome: Adequate for Discharge Goal: Pt. Will Transfer To Toilet Outcome: Adequate for Discharge

## 2024-03-29 NOTE — Plan of Care (Signed)
 Id follow up note   Abd pelv ct no sign of visceral abscess Blood cx negative Wound cx step constellatus/e faecalis   Afebrile   Labs Lab Results  Component Value Date   WBC 7.0 03/29/2024   HGB 11.0 (L) 03/29/2024   HCT 34.2 (L) 03/29/2024   MCV 96.3 03/29/2024   PLT 307 03/29/2024   Last metabolic panel Lab Results  Component Value Date   GLUCOSE 129 (H) 03/28/2024   NA 137 03/28/2024   K 3.7 03/28/2024   CL 103 03/28/2024   CO2 23 03/28/2024   BUN 9 03/28/2024   CREATININE 0.62 03/28/2024   GFRNONAA >60 03/28/2024   CALCIUM  8.8 (L) 03/28/2024   PHOS 3.9 09/03/2021   PROT 7.8 03/24/2024   ALBUMIN  3.8 03/24/2024   LABGLOB 2.8 10/28/2023   AGRATIO 1.5 12/22/2022   BILITOT 3.0 (H) 03/24/2024   ALKPHOS 210 (H) 03/24/2024   AST 47 (H) 03/24/2024   ALT 38 03/24/2024   ANIONGAP 11 03/28/2024     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 Good bioavailability for what appear to be soft tissue infection -id clinic f/u 8/21 @ 3pm -maintain standard isolation while in-house -discharge ok from id standpoint when cleared by surgery -discussed with TRH

## 2024-03-30 ENCOUNTER — Telehealth: Payer: Self-pay | Admitting: *Deleted

## 2024-03-30 NOTE — Therapy (Addendum)
 OUTPATIENT PHYSICAL THERAPY EVALUATION   Patient Name: Randy Hebert MRN: [id number] DOB:01/18/1966, 58 y.o., male Today's Date: 03/31/2024  END OF SESSION:  PT End of Session - 03/31/24 1224     Visit Number 1    Number of Visits 20    Date for PT Re-Evaluation 06/23/24    Progress Note Due on Visit 10    PT Start Time 0840    PT Stop Time 0930    PT Time Calculation (min) 50 min    Activity Tolerance Patient tolerated treatment well;Patient limited by pain    Behavior During Therapy [facility] for tasks assessed/performed          Past Medical History:  Diagnosis Date   ACE inhibitor-aggravated angioedema    Allergy     Anemia    Angio-edema    Anxiety    Arthritis    Asthma    hip replacement   Back pain    Bradycardia 04/28/2017   Bulging of cervical intervertebral disc    CAD (coronary artery disease)    lateral STEMI 02/06/2015 00% D1 occlusion treated with Promus Premier 2.5 mm x 16 mm DES, 70% ramus stenosis, 40% mid RCA stenosis, 45% distal RCA stenosis, EF 45-50%   CHF (congestive heart failure) (HCC)    COPD (chronic obstructive pulmonary disease) (HCC)    Depression    Diabetes mellitus without complication (HCC)    Difficult intubation    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   Dry eye    Dyspnea    Early satiety 09/23/2016   Fatty liver    GERD (gastroesophageal reflux disease)    HCAP (healthcare-associated pneumonia) 05/15/2017   Headache    Heart murmur    Hip pain    Hyperlipidemia    Hypertension    Lobar pneumonia (HCC) 05/15/2017   Melena 08/04/2018   MI (myocardial infarction) [facility])    Myocardial infarction (HCC)    Neck pain    Non-ST elevation (NSTEMI) myocardial infarction (HCC) 04/27/2017   NSTEMI (non-ST elevated myocardial infarction) (HCC) 04/26/2017   Otitis media    Pleurisy    Pneumonia due to COVID-19 virus 10/07/2019   Rectal bleeding 11/08/2018   Right shoulder pain 03/20/2020   Sinus pause    9 sec sinus  pause on telemetry after started on coreg  after MI, avoid AV nodal blocking agent   Sleep apnea    uses a cpap   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   STEMI (ST elevation myocardial infarction) (HCC) 05/31/2015   Substance abuse (HCC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   Syncope 06/29/2017   Syncope and collapse 05/15/2017   Transaminitis 08/04/2018   Unilateral primary osteoarthritis, right hip 07/01/2016   Past Surgical History:  Procedure Laterality Date   ARTERY EXPLORATION Left 03/24/2024   Procedure: LEFT ARCH AORTOGRAM, LEFT UPPER EXTREMITY ANGIOGRAM WITH FIRST ORDER CANNULATION, LEFT AXILLARY ANGIOPLASTY AND STENTING, EVACUATION OF LEFT AXILLARY HEMATOMA;  Surgeon: Magda Debby SAILOR, MD;  Location: [facility] OR;  Service: Vascular;  Laterality: Left;   ARTERY REPAIR Left 03/11/2024   Procedure: REPAIR LEFT AXILLARY ARTERY;  Surgeon: Serene Gaile ORN, MD;  Location: [facility] OR;  Service: Vascular;  Laterality: Left;  ARTERIAL REPAIR, LEFT AXILLARY   BIOPSY  10/09/2016   Procedure: BIOPSY;  Surgeon: Lamar CHRISTELLA Hollingshead, MD;  Location: [facility] ENDO SUITE;  Service: Endoscopy;;   BIOPSY  11/28/2019   Procedure: BIOPSY;  Surgeon: Hollingshead Lamar CHRISTELLA, MD;  Location: [facility] ENDO  SUITE;  Service: Endoscopy;;   BIOPSY  06/15/2023   Procedure: BIOPSY;  Surgeon: Shaaron Lamar HERO, MD;  [facility]: [facility] ENDO SUITE;  Service: Endoscopy;;   BRONCHIAL BIOPSY  10/16/2022   Procedure: BRONCHIAL BIOPSIES;  Surgeon: Gladis Leonor HERO, MD;  Location: [facility] ENDOSCOPY;  Service: Pulmonary;;   BRONCHIAL NEEDLE ASPIRATION BIOPSY  10/16/2022   Procedure: BRONCHIAL NEEDLE ASPIRATION BIOPSIES;  Surgeon: Gladis Leonor HERO, MD;  Location: [facility] ENDOSCOPY;  Service: Pulmonary;;   BRONCHIAL WASHINGS  10/16/2022   Procedure: BRONCHIAL WASHINGS;  Surgeon: Gladis Leonor HERO, MD;  Location: [facility] ENDOSCOPY;  Service: Pulmonary;;   CARDIAC CATHETERIZATION N/A 02/06/2015   Procedure: Left Heart Cath and Coronary Angiography;  Surgeon: Alm LELON Clay, MD;   Location: [facility] INVASIVE CV LAB;  Service: Cardiovascular;  Laterality: N/A;   CARDIAC CATHETERIZATION N/A 02/06/2015   Procedure: Coronary Stent Intervention;  Surgeon: Alm LELON Clay, MD;  Location: [facility] INVASIVE CV LAB;  Service: Cardiovascular;  Laterality: N/A;   COLONOSCOPY WITH PROPOFOL  N/A 10/09/2016   Sigmoid and descending colon diverticulosis, four 4-6 mm polyps in sigmoid, one 4 mm polyp in descending. Tubular adenomas and hyperplastic. 5 year surveillance.    COLONOSCOPY WITH PROPOFOL  N/A 11/24/2016   Sigmoid and descending colon diverticulosis, four 4-6 mm polyps in sigmoid, one 4 mm polyp in descending. Tubular adenomas and hyperplastic. 5 year surveillance.    COLONOSCOPY WITH PROPOFOL  N/A 10/18/2021   Carver: nonbleeding internal hemorrhoids, small and large mouth diverticula found in the sigmoid, descending, transverse colon.  A 9 mm sessile polyp was found in the ascending colon that was removed.  4 sessile polyps found in the sigmoid, descending, transverse colon 3 to 5 mm in size, examining otherwise.  Path revealed tubular adenomas.  Repeat due in 5 years for surveillance.   CORONARY ANGIOPLASTY WITH STENT PLACEMENT  01/2015   CORONARY STENT INTERVENTION N/A 04/27/2017   Procedure: CORONARY STENT INTERVENTION;  Surgeon: Mady Bruckner, MD;  Location: [facility] INVASIVE CV LAB;  Service: Cardiovascular;  Laterality: N/A;   ELECTROPHYSIOLOGY STUDY N/A 06/29/2017   Procedure: ELECTROPHYSIOLOGY STUDY;  Surgeon: Waddell Danelle LELON, MD;  Location: [facility] INVASIVE CV LAB;  Service: Cardiovascular;  Laterality: N/A;   ESOPHAGOGASTRODUODENOSCOPY (EGD) WITH PROPOFOL  N/A 10/09/2016   Dr. Shaaron: LA grade a esophagitis.  Barrett's esophagus, biopsy-proven.  Small hiatal hernia.  EGD February 2019   ESOPHAGOGASTRODUODENOSCOPY (EGD) WITH PROPOFOL  N/A 11/28/2019    salmon-colored esophageal mucosa (Barrett's) small hiatal hernia, portal hypertensive gastropathy, normal duodenum, 3 year surveillance    ESOPHAGOGASTRODUODENOSCOPY (EGD) WITH PROPOFOL  N/A 09/06/2021   three columns of grade 1 varices in distal esophagus, no stigmata of bleeding or red wale signs. Small hiatal hernia. Mild portal gastropathy. Normal duodenum. Repeat upper endoscopy in 1 year for surveillance.   ESOPHAGOGASTRODUODENOSCOPY (EGD) WITH PROPOFOL  N/A 06/15/2023   Procedure: ESOPHAGOGASTRODUODENOSCOPY (EGD) WITH PROPOFOL ;  Surgeon: Shaaron Lamar HERO, MD;  [facility] END[facility];  Service: Endoscopy;  Laterality: N/A;  830am, asa 3   FIDUCIAL MARKER PLACEMENT  10/16/2022   Procedure: FIDUCIAL MARKER PLACEMENT;  Surgeon: Gladis Leonor HERO, MD;  Location: [facility] ENDOSCOPY;  Service: Pulmonary;;   INCISION / DRAINAGE HAND / FINGER     LEFT HEART CATH AND CORONARY ANGIOGRAPHY N/A 04/27/2017   Procedure: LEFT HEART CATH AND CORONARY ANGIOGRAPHY;  Surgeon: Mady Bruckner, MD;  Location: [facility] INVASIVE CV LAB;  Service: Cardiovascular;  Laterality: N/A;   LOOP RECORDER INSERTION  06/29/2017   Procedure: Loop Recorder Insertion;  Surgeon: Waddell Danelle LELON, MD;  Location: [facility] INVASIVE CV LAB;  Service: Cardiovascular;;   POLYPECTOMY  11/24/2016   Procedure: POLYPECTOMY;  Surgeon: Lamar CHRISTELLA Hollingshead, MD;  [facility] END[facility];  Service: Endoscopy;;  descending and sigmoid   POLYPECTOMY  10/18/2021   Procedure: POLYPECTOMY;  Surgeon: Cindie Carlin POUR, DO;  [facility] END[facility];  Service: Endoscopy;;   TOTAL HIP ARTHROPLASTY Right 07/01/2016   TOTAL HIP ARTHROPLASTY Right 07/01/2016   Procedure: RIGHT TOTAL HIP ARTHROPLASTY ANTERIOR APPROACH;  Surgeon: Lonni CINDERELLA Poli, MD;  Location: [facility] OR;  Service: Orthopedics;  Laterality: Right;   ULTRASOUND GUIDANCE FOR VASCULAR ACCESS Left 03/24/2024   Procedure: ULTRASOUND GUIDANCE, FOR VASCULAR ACCESS;  Surgeon: Magda Debby SAILOR, MD;  Location: [facility] OR;  Service: Vascular;  Laterality: Left;   Patient Active Problem List   Diagnosis Date Noted   Acute postoperative anemia due to greater than  expected blood loss 03/26/2024   Obesity, class 1 03/26/2024   Aneurysm of axillary artery (HCC) 03/24/2024   Acute blood loss anemia 03/13/2024   Axillary artery injury 03/12/2024   Axillary artery injury, left, initial encounter 03/12/2024   Hypokalemia 03/03/2024   Acute CVA (cerebrovascular accident) (HCC) 03/02/2024   Essential hypertension 03/02/2024   Dyslipidemia 03/02/2024   Type 2 diabetes mellitus with hyperlipidemia (HCC) 03/02/2024   Depression 03/02/2024   Acute left-sided weakness 03/01/2024   Acute right-sided low back pain with right-sided sciatica 11/30/2023   Anxiety 04/23/2023   Erectile dysfunction 02/24/2023   Chronic eczematous otitis externa of both ears 02/05/2023   Tobacco abuse 12/25/2022   Malignant neoplasm of upper lobe of right lung (HCC) 09/25/2022   Cervical spondylosis with myelopathy 08/01/2022   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   Vocal cord dysfunction 05/21/2022   DDD (degenerative disc disease), cervical 05/21/2022   Osteoarthritis of both hands 05/19/2022   History of GI bleed 11/13/2021   Insomnia 09/13/2021   Cirrhosis of liver (HCC) 11/08/2018   Barrett's esophagus 08/04/2018   Vitamin D  deficiency 06/30/2018   Alcohol  abuse 06/30/2018   DM type 2 causing vascular disease (HCC) 08/17/2017   Chronic combined systolic and diastolic CHF (congestive heart failure) (HCC) 05/15/2017   OSA (obstructive sleep apnea) 04/28/2017   Depression with anxiety 04/15/2017   GERD (gastroesophageal reflux disease) 09/23/2016   Asthmatic bronchitis , chronic (HCC) 08/27/2015   Mixed hyperlipidemia 08/27/2015   Class 1 obesity due to excess calories with serious comorbidity and body mass index (BMI) of 32.0 to 32.9 in adult 08/27/2015   CAD (coronary artery disease) 05/31/2015   Essential hypertension, benign 02/09/2015    PCP: Cook, Jayce G, DO   REFERRING PROVIDER: Noralee Elidia Sieving*   REFERRING DIAG: left axillary pseudoaneurysm   Rationale  for Evaluation and Treatment:  Rehabiliation  THERAPY DIAG:  Acute pain of left shoulder  Muscle weakness (generalized)  ONSET DATE: 03/11/24   SUBJECTIVE:  SUBJECTIVE STATEMENT: Mr. Marchetti relays he tripped and fell over a screw sticking up from floor causing shoulder injury and was admitted to hospital 03/11/24. He had operation to repair left axillary nerve then he was re admitted to the hospital 03/25/24 with the working diagnosis of recurrent left axillary pseudoaneurysm, requiring stenting and hematoma drainage. He arrives today stating PT is supposed to help him with dressing/bandage change and that he is supposed to keep incisions dry. He says he has a lot of pain in his left elbow and wrist with a lot of swelling but did not have any imaging there.  PERTINENT HISTORY:  past medical history of diabetes mellitus type 2, hypertension, CAD, liver cirrhosis, anxiety, depression, OSA, CVA w/ left-sided weakness who had a recent hospitalization 7/11 to 03/19/24 post traumatic left shoulder dislocation, complicated with left axillary hematoma with left axillary pseudoaneurysm. On 07/12 he underwent open exposure and repair of the left axillary pseudoaneurysm and evacuation of left axillary hematoma.   PAIN:  NPRS scale: 9/10 upon arrival Pain location:left shoulder/axillary, left forearm and wrist Pain description: constant, achy, sharp Aggravating factors: any use of this left arm. Relieving factors: rest, meds   PRECAUTIONS: ,  Shoulder  RED FLAGS: None   WEIGHT BEARING RESTRICTIONS:  He is unsure so PT will keep him NWB for his left UE for now.  FALLS:  Has patient fallen in last 6 months? Yes. Number of falls 1   OCCUPATION:  disabled  PLOF:  Independent with basic ADLs  PATIENT GOALS:   Reduce pain and get his arm back to normal  OBJECTIVE:  Note: Objective measures were completed at Evaluation unless otherwise noted.  DIAGNOSTIC FINDINGS:  Chest CT IMPRESSION: 1. Enlarging left axillary hematoma with arterial contrast extravasation from the posterior wall of the left axillary artery into a 1.5 x 1.8 cm pseudoaneurysm, with additional 1.8 x 1.1 cm pocket of contrast more posteriorly within the collection. 2. Increased heterogeneous hematoma in the left subscapularis region measuring 7.3 x 4.8 cm, but no focal contrast is seen within this. 3. Emphysema. 4. Right apical fiducial markers and post XRT changes, with treated nodule just above the fiducial markers today measuring 8 mm, previously 9 mm. 5. Aortic and coronary artery atherosclerosis.  Right aortic arch. 6. Enlarged pulmonary trunk 3.3 cm indicating arterial hypertension. 7. Cirrhosis and splenomegaly.  Prominent patent portal vein 1.7 cm. 8. Critical Value/emergent results were called by telephone at the time of interpretation on 03/24/2024 at 5:18 am to provider [facility] , who verbally acknowledged these results.  Shoulder XR IMPRESSION: No acute bony abnormality.   Postoperative changes in the left axilla.  No imaging for left elbow/wrist  PATIENT SURVEYS:  Patient-Specific Activity Scoring Scheme  0 represents "unable to perform." 10 represents "able to perform at prior level. 0 1 2 3 4 5 6 7 8 9  10 (Date and Score)   Activity Eval     1. Raising left arm  0    2. Putting on and off shirt 1    3.     4.    5.    Score 0.5/10    Total score = sum of the activity scores/number of activities Minimum detectable change (90%CI) for average score = 2 points Minimum detectable change (90%CI) for single activity score = 3 points     EDEMA:  Yes: moderate edema noted in left upper extremity from shoulder to hand  Observation:  2 incisions noted, one at proximal chest,  one at  axilla, both incisions closed with stitches and appear closed without any drainage or signs of infection at the moment. Dressing was removed and changed for him at eval.     UPPER EXTREMITY ROM:  AROM/Passive ROM Left eval   Shoulder flexion /50   Shoulder extension    Shoulder abduction /50   Shoulder adduction    Shoulder extension    Shoulder internal rotation /50   Shoulder external rotation /40   Elbow flexion    Elbow extension    Wrist flexion    Wrist extension    Wrist ulnar deviation    Wrist radial deviation    Wrist pronation    Wrist supination     (Blank rows = not tested)   UPPER EXTREMITY MMT:  MMT Left eval   Shoulder flexion 1   Shoulder extension    Shoulder abduction 1   Shoulder adduction    Shoulder extension    Shoulder internal rotation 1   Shoulder external rotation 1   Middle trapezius    Lower trapezius    Elbow flexion 1   Elbow extension 2   Wrist flexion    Wrist extension    Wrist ulnar deviation    Wrist radial deviation    Wrist pronation    Wrist supination    Grip strength     (Blank rows = not tested)                                                                                                                              TREATMENT DATE:  Eval HEP creation and review with demonstration and trial set preformed, see below for details Manual therapy: Left shoulder PROM in all planes Selfcare:change out dressing every 1-2 days, keep dry and sponge bath, do not get incisions wet. Provided him with adhesive bandages for dressing changes at home.    PATIENT EDUCATION: Education details: HEP, PT plan of care, selfcare Person educated: Patient Education method: Explanation, Demonstration, Verbal cues, and Handouts Education comprehension: verbalized understanding, further education recommended   HOME EXERCISE PROGRAM: Access Code: [email or web address] URL: [email or web address] Date: 03/31/2024 Prepared by: Redell Moose  Exercises - Circular Shoulder Pendulum with Table Support  - 2-3 x daily - 7 x weekly - 1 sets - 15 reps - Seated Shoulder Abduction Towel Slide at Table Top  - 2-3 x daily - 6 x weekly - 1 sets - 10 reps - 5 sec hold - Seated Shoulder Flexion Towel Slide at Table Top  - 2-3 x daily - 6 x weekly - 1 sets - 10 reps - 5 sec hold - Seated Shoulder External Rotation PROM on Table  - 2-3 x daily - 6 x weekly - 1 sets - 10 reps - 5 sec hold - Seated Gripping Towel  - 2-3 x daily - 6 x weekly - 1 sets - 20 reps - Standing Elbow Flexion Extension AROM  -  2-3 x daily - 6 x weekly - 1 sets - 10 reps  ASSESSMENT:  CLINICAL IMPRESSION: Patient referred to PT for  recurrent left axillary pseudoaneurysm causing pain and difficulty using his left arm. He has complex medical history for this left shoulder including CVA w/ left-sided weakness who had a recent hospitalization 7/11 to 03/19/24 post traumatic left shoulder dislocation, complicated with left axillary hematoma with left axillary pseudoaneurysm. On 07/12 he underwent open exposure and repair of the left axillary pseudoaneurysm and evacuation of left axillary hematoma. His incisions did look well healing today without any obvious signs of infection. Patient will benefit from skilled PT to improve overall function and to address impairments and limitations listed below. He also has a lot of swelling and pain in his left forearm and wrist and has not had any imaging so if this does not improve would recommend follow up with doctor about this.   OBJECTIVE IMPAIRMENTS: decreased activity tolerance for ADL's, difficulty walking, decreased balance, decreased endurance, decreased mobility, decreased ROM, decreased strength, impaired flexibility, impaired UE use, and pain.  ACTIVITY LIMITATIONS: bending, lifting, carry, reaching, locomotion, cleaning, community activity, driving,  PERSONAL FACTORS: see above PMH  also affecting patient's functional  outcome.  REHAB POTENTIAL: Good  CLINICAL DECISION MAKING: Evolving/moderate complexity  EVALUATION COMPLEXITY: Moderate    GOALS: Short term PT Goals Target date: 04/28/2024   Pt will be I and compliant with HEP. Baseline:  Goal status: New Pt will decrease pain by 25% overall Baseline: Goal status: New  Long term PT goals Target date:06/23/2024   Pt will improve right shoulder and elbow AROM to [facility] to improve functional mobility Baseline: Goal status: New Pt will improve  right arm strength to at least 4+/5 MMT to improve functional strength Baseline: Goal status: New Pt will improve Patient specific functional scale (PSFS) to at least 7/10 to show improved function level Baseline: Goal status: New Pt will reduce pain to overall less than 3/10 with usual activity Baseline: Goal status: New PLAN: PT FREQUENCY: 1-3 times per week   PT DURATION: 6-8 weeks  PLANNED INTERVENTIONS (unless contraindicated):  97110-Therapeutic exercises, 97530- Therapeutic activity, [id number]- Neuromuscular re-education, 97535- Self Care, 02859- Manual therapy, G0283- Electrical stimulation (unattended), 97016- Vasopneumatic device, [id number]- Ultrasound, and [id number]- Ionotophoresis 4mg /ml Dexamethasone   PLAN FOR NEXT SESSION: review HEP, AAROM and PROM as tolerated   Redell JONELLE Moose, PT,DPT 03/31/2024, 12:26 PM

## 2024-03-30 NOTE — Transitions of Care (Post Inpatient/ED Visit) (Signed)
   03/30/2024  Name: Randy Hebert MRN: [id number] DOB: 12-Aug-1966  Today's TOC FU Call Status: Today's TOC FU Call Status:: Unsuccessful Call (1st Attempt) Unsuccessful Call (1st Attempt) Date: 03/30/24  Attempted to reach the patient regarding the most recent Inpatient/ED visit.  Follow Up Plan: Additional outreach attempts will be made to reach the patient to complete the Transitions of Care (Post Inpatient/ED visit) call.   Cathlean Headland BSN RN [facility] Health Care Management Coordinator [email or web address] Direct Dial: [phone]  Fax: [phone] Website: [email or web address]

## 2024-03-31 ENCOUNTER — Ambulatory Visit: Attending: Internal Medicine | Admitting: Physical Therapy

## 2024-03-31 ENCOUNTER — Telehealth: Payer: Self-pay

## 2024-03-31 ENCOUNTER — Encounter: Payer: Self-pay | Admitting: Physical Therapy

## 2024-03-31 ENCOUNTER — Ambulatory Visit: Payer: Self-pay

## 2024-03-31 ENCOUNTER — Ambulatory Visit: Admitting: Physical Therapy

## 2024-03-31 DIAGNOSIS — S43005A Unspecified dislocation of left shoulder joint, initial encounter: Secondary | ICD-10-CM | POA: Insufficient documentation

## 2024-03-31 DIAGNOSIS — M25512 Pain in left shoulder: Secondary | ICD-10-CM | POA: Diagnosis present

## 2024-03-31 DIAGNOSIS — M6281 Muscle weakness (generalized): Secondary | ICD-10-CM | POA: Diagnosis present

## 2024-03-31 DIAGNOSIS — S45902A Unspecified injury of unspecified blood vessel at shoulder and upper arm level, left arm, initial encounter: Secondary | ICD-10-CM | POA: Diagnosis not present

## 2024-03-31 NOTE — Transitions of Care (Post Inpatient/ED Visit) (Signed)
   03/31/2024  Name: MANFORD SPRONG MRN: [id number] DOB: [birth year]  Today's TOC FU Call Status: Today's TOC FU Call Status:: Unsuccessful Call (2nd Attempt) Unsuccessful Call (2nd Attempt) Date: 03/31/24  Attempted to reach the patient regarding the most recent Inpatient/ED visit.  Follow Up Plan: Additional outreach attempts will be made to reach the patient to complete the Transitions of Care (Post Inpatient/ED visit) call.   Shona Prow RN, CCM [place]  VBCI-Population Health RN Care Manager [phone]

## 2024-03-31 NOTE — Telephone Encounter (Signed)
 FYI Only or Action Required?: FYI only for provider.  Patient was last seen in primary care on 03/01/2024 by Cook, Jayce G, DO.  Called Nurse Triage reporting Joint Swelling.  Symptoms began several weeks ago.  Interventions attempted: Prescription medications: naproxen , oxycodone ,amoxicillin  and Other: elevates her left wrist/hand.  Symptoms are: left wrist thru hand/fingers swelling and pain, slight purple discoloration to left hand, blisters to left hand/wrist unchanged.  Triage Disposition: See PCP Within 2 Weeks (overriding See HCP Within 4 Hours (Or PCP Triage))  Patient/caregiver understands and will follow disposition?: Yes                 Copied from CRM [id number]. Topic: Clinical - Medication Question >> Mar 31, 2024 12:37 PM [place] B wrote: Reason for CRM: Patient left wrist swollen and can't move hand that well Reason for Disposition  [1] SEVERE pain (e.g., excruciating, unable to use hand or wrist at all) AND [2] not improved after 2 hours of pain medicine  Answer Assessment - Initial Assessment Questions Patient states he thinks his stents were placed through his groin, he does not think they went through his left wrist. Patient requesting if he can wait and see his PCP til next week.  1. ONSET: When did the swelling start? (e.g., minutes, hours, days, weeks)     03/11/24. He states when he had the fall on the 11th and injured himself his entire left arm was swollen. He states the swelling in the rest of his arm has improved.  2. LOCATION: What part of the wrist is swollen?  Are both wrists swollen or just one wrist?     Left wrist to fingertips.  3. SEVERITY: How bad is the swelling?      He states he doesn't have full use of the left wrist. He states he can't pick items up. He states he can wiggle his fingers. Painful swelling in left wrist and hand  8/10.  4. RECURRENT SYMPTOM: Have you had wrist swelling before? If Yes, ask: When was the  last time? What happened that time?     Yes, he states he has had swelling before when he has broken his pinky or thumb.  5. CAUSE: What do you think is causing the wrist swelling? (e.g., arthritis, ganglion cyst, insect bite, recent injury)     Recent injury, when he fell on the 11th he states he thinks maybe he fell on his left hand and injured it.   6. OTHER SYMPTOMS: Do you have any other symptoms? (e.g., fever, hand pain)     Numbness/tingling in left wrist/hand, purple discoloration to left wrist/hand (he states it looks mostly like his right hand but slightly purple), blisters to left hand and wrist . Patient denies any fever.  7. PREGNANCY: Is there any chance you are pregnant? When was your last menstrual period?     N/A.  Protocols used: Wrist Swelling-A-AH

## 2024-04-01 ENCOUNTER — Ambulatory Visit (HOSPITAL_COMMUNITY): Admission: RE | Admit: 2024-04-01 | Source: Home / Self Care | Admitting: Ophthalmology

## 2024-04-01 ENCOUNTER — Telehealth: Payer: Self-pay

## 2024-04-01 ENCOUNTER — Encounter (HOSPITAL_COMMUNITY): Admission: RE | Payer: Self-pay | Source: Home / Self Care

## 2024-04-01 LAB — CULTURE, BLOOD (ROUTINE X 2)
Culture: NO GROWTH
Culture: NO GROWTH
Special Requests: ADEQUATE
Special Requests: ADEQUATE

## 2024-04-01 SURGERY — PHACOEMULSIFICATION, CATARACT, WITH IOL INSERTION
Anesthesia: Monitor Anesthesia Care | Laterality: Right

## 2024-04-01 NOTE — Transitions of Care (Post Inpatient/ED Visit) (Signed)
   04/01/2024  Name: Randy Hebert MRN: [id number] DOB: 12-Dec-1965  Today's TOC FU Call Status: Today's TOC FU Call Status:: Unsuccessful Call (3rd Attempt) Unsuccessful Call (3rd Attempt) Date: 04/01/24  Attempted to reach the patient regarding the most recent Inpatient/ED visit.  Follow Up Plan: No further outreach attempts will be made at this time. We have been unable to contact the patient.  Shona Prow RN, CCM   VBCI-Population Health RN Care Manager [phone]

## 2024-04-05 ENCOUNTER — Encounter: Payer: Self-pay | Admitting: Family Medicine

## 2024-04-05 ENCOUNTER — Ambulatory Visit: Admitting: Family Medicine

## 2024-04-05 VITALS — BP 130/76 | HR 101 | Temp 97.0°F | Ht 65.0 in | Wt 181.0 lb

## 2024-04-05 DIAGNOSIS — S45002D Unspecified injury of axillary artery, left side, subsequent encounter: Secondary | ICD-10-CM

## 2024-04-05 MED ORDER — OXYCODONE HCL 5 MG PO TABS: 5.0000 mg | ORAL_TABLET | Freq: Three times a day (TID) | ORAL | 0 refills | Status: DC | PRN

## 2024-04-05 NOTE — Progress Notes (Signed)
 Subjective:  Patient ID: Randy Hebert, male    DOB: 1966/07/02  Age: 58 y.o. MRN: [id number]  CC:   Chief Complaint  Patient presents with   Hospitalization Follow-up    Hand and wrist sore, swollen  and limited movement.  Would like staples looked at in shoulder.     HPI:  58 year old male presents for evaluation of the above.  Patient has had 2 hospital admissions recently after he suffered a fall and dislocated his shoulder which subsequently caused injury to the left axillary artery and cause left axillary hematoma.  He had pseudoaneurysm repair on 7/12.  Was subsequently discharged home.  On 7/24 he presented to the hospital with worsening pain again and was subsequently taken back to the OR for repeat surgical procedures - ultrasound guided right common femoral artery access, arch aortogram, Left upper extremity angiogram with first order cannulation, Left axillary artery angioplasty and stenting, Incision and drainage of left axillary hematoma    He is axillary wound grew Enterococcus.  Patient reports significant pain of the left arm.  Decreased range of motion.  Decreased strength.  Significant swelling.  He has upcoming follow-up with vascular.  Patient requesting refill on pain medication given the severity of his pain.  Patient Active Problem List   Diagnosis Date Noted   Acute postoperative anemia due to greater than expected blood loss 03/26/2024   Obesity, class 1 03/26/2024   Aneurysm of axillary artery (HCC) 03/24/2024   Axillary artery injury 03/12/2024   Hypokalemia 03/03/2024   Acute CVA (cerebrovascular accident) (HCC) 03/02/2024   Essential hypertension 03/02/2024   Dyslipidemia 03/02/2024   Type 2 diabetes mellitus with hyperlipidemia (HCC) 03/02/2024   Depression 03/02/2024   Acute right-sided low back pain with right-sided sciatica 11/30/2023   Anxiety 04/23/2023   Erectile dysfunction 02/24/2023   Chronic eczematous otitis externa of both ears  02/05/2023   Tobacco abuse 12/25/2022   Malignant neoplasm of upper lobe of right lung (HCC) 09/25/2022   Cervical spondylosis with myelopathy 08/01/2022   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   Vocal cord dysfunction 05/21/2022   DDD (degenerative disc disease), cervical 05/21/2022   Osteoarthritis of both hands 05/19/2022   History of GI bleed 11/13/2021   Insomnia 09/13/2021   Cirrhosis of liver (HCC) 11/08/2018   Barrett's esophagus 08/04/2018   Vitamin D  deficiency 06/30/2018   Alcohol  abuse 06/30/2018   DM type 2 causing vascular disease (HCC) 08/17/2017   Chronic combined systolic and diastolic CHF (congestive heart failure) (HCC) 05/15/2017   OSA (obstructive sleep apnea) 04/28/2017   Depression with anxiety 04/15/2017   GERD (gastroesophageal reflux disease) 09/23/2016   Asthmatic bronchitis , chronic (HCC) 08/27/2015   Mixed hyperlipidemia 08/27/2015   Class 1 obesity due to excess calories with serious comorbidity and body mass index (BMI) of 32.0 to 32.9 in adult 08/27/2015   CAD (coronary artery disease) 05/31/2015    Social Hx   Social History   Socioeconomic History   Marital status: Divorced    Spouse name: Randy Hebert   Number of children: 3   Years of education: 12   Highest education level: Not on file  Occupational History   Occupation: SSI  Tobacco Use   Smoking status: Every Day    Current packs/day: 1.00    Average packs/day: 1 pack/day for 46.0 years (46.0 ttl pk-yrs)    Types: Cigarettes    Passive exposure: Never   Smokeless tobacco: Former    Types: Chew   Tobacco comments:  Smokes 1 pack a day of cigarettes (20 cig)10/16/23  Vaping Use   Vaping status: Former  Substance and Sexual Activity   Alcohol  use: Yes    Comment: rare beer use   Drug use: No    Comment: history of drug use- marijuana, cocaine- quit 2014   Sexual activity: Yes    Partners: Female    Birth control/protection: None  Other Topics Concern   Not on file  Social History  Narrative   Lives with girlfriend Randy Hebert   3 children, but 1 OD  In 2019.   Grandchildren-2      Two cats: may and princess; and a dog-foxy      Enjoy: spending time with pets, TV, yard work       Diet: eats all food groups   Caffeine: half and half coffee, some tea-half and half decaf   Water : 6-8 cups daily      Wears seat belt   Does not use phone while driving   Smoke detectors at home      Social Drivers of Health   Financial Resource Strain: Low Risk  (01/08/2024)   Overall Financial Resource Strain (CARDIA)    Difficulty of Paying Living Expenses: Not hard at all  Food Insecurity: No Food Insecurity (03/24/2024)   Hunger Vital Sign    Worried About Running Out of Food in the Last Year: Never true    Ran Out of Food in the Last Year: Never true  Transportation Needs: No Transportation Needs (03/24/2024)   PRAPARE - Administrator, Civil Service (Medical): No    Lack of Transportation (Non-Medical): No  Physical Activity: Insufficiently Active (01/08/2024)   Exercise Vital Sign    Days of Exercise per Week: 2 days    Minutes of Exercise per Session: 60 min  Stress: No Stress Concern Present (01/08/2024)   [employer] of Occupational Health - Occupational Stress Questionnaire    Feeling of Stress : Not at all  Social Connections: Moderately Integrated (03/02/2024)   Social Connection and Isolation Panel    Frequency of Communication with Friends and Family: More than three times a week    Frequency of Social Gatherings with Friends and Family: Once a week    Attends Religious Services: More than 4 times per year    Active Member of [employer] or Organizations: Yes    Attends Engineer, structural: More than 4 times per year    Marital Status: Divorced    Review of Systems Per HPI  Objective:  BP 130/76   Pulse (!) 101   Temp (!) 97 F (36.1 C)   Ht 5' 5 (1.651 m)   Wt 181 lb (82.1 kg)   SpO2 97%   BMI 30.12 kg/m      04/05/2024   10:03 AM  03/29/2024   11:20 AM 03/29/2024    8:48 AM  BP/Weight  Systolic BP 130 135 124  Diastolic BP 76 78 72  Wt. (Lbs) 181    BMI 30.12 kg/m2      Physical Exam Vitals and nursing note reviewed.  Constitutional:      General: He is not in acute distress.    Appearance: He is obese.  HENT:     Head: Normocephalic and atraumatic.  Cardiovascular:     Rate and Rhythm: Normal rate and regular rhythm.  Musculoskeletal:     Comments: Decreased range of motion of the left shoulder.  Decreased handgrip strength the left hand.  Patient has both sutures and staples in the left axilla.  There is no evidence of infection.  Significant swelling of the left arm.  Skin:    Comments: Patient has extensive bruising to the left upper arm/left axilla as well as the chest and abdomen.  Neurological:     Mental Status: He is alert.     Lab Results  Component Value Date   WBC 7.0 03/29/2024   HGB 11.0 (L) 03/29/2024   HCT 34.2 (L) 03/29/2024   PLT 307 03/29/2024   GLUCOSE 129 (H) 03/28/2024   CHOL 108 03/25/2024   TRIG 99 03/25/2024   HDL 23 (L) 03/25/2024   LDLCALC 65 03/25/2024   ALT 38 03/24/2024   AST 47 (H) 03/24/2024   NA 137 03/28/2024   K 3.7 03/28/2024   CL 103 03/28/2024   CREATININE 0.62 03/28/2024   BUN 9 03/28/2024   CO2 23 03/28/2024   TSH 3.61 06/21/2020   PSA 0.23 06/21/2020   INR 1.0 03/02/2024   HGBA1C 5.1 03/24/2024   MICROALBUR 1.8 11/09/2019     Assessment & Plan:  Injury of left axillary artery, subsequent encounter Assessment & Plan: Spoke with vascular surgery today.  Informed them of his current physical exam findings.  This is to be expected per vascular's report.  He has upcoming follow-up next week.  Advised elevation.  Given his significant pain, pain medication was refilled.   Other orders -     oxyCODONE  HCl; Take 1 tablet (5 mg total) by mouth every 8 (eight) hours as needed for severe pain (pain score 7-10).  Dispense: 15 tablet; Refill:  0    Follow-up:  Return if symptoms worsen or fail to improve.  Jacqulyn Ahle DO [facility]

## 2024-04-05 NOTE — Patient Instructions (Signed)
 Rest, elevate as much as you can (comfortably).  Medication as directed.  If you worsen, go directly to the hospital.

## 2024-04-05 NOTE — Assessment & Plan Note (Signed)
 Spoke with vascular surgery today.  Informed them of his current physical exam findings.  This is to be expected per vascular's report.  He has upcoming follow-up next week.  Advised elevation.  Given his significant pain, pain medication was refilled.

## 2024-04-07 ENCOUNTER — Ambulatory Visit: Attending: Internal Medicine | Admitting: Physical Therapy

## 2024-04-07 DIAGNOSIS — M25512 Pain in left shoulder: Secondary | ICD-10-CM | POA: Insufficient documentation

## 2024-04-07 DIAGNOSIS — M6281 Muscle weakness (generalized): Secondary | ICD-10-CM | POA: Insufficient documentation

## 2024-04-11 ENCOUNTER — Other Ambulatory Visit (HOSPITAL_COMMUNITY): Payer: Self-pay

## 2024-04-11 ENCOUNTER — Ambulatory Visit: Attending: Surgery | Admitting: Physician Assistant

## 2024-04-11 ENCOUNTER — Ambulatory Visit: Admitting: Physical Therapy

## 2024-04-11 ENCOUNTER — Encounter: Payer: Self-pay | Admitting: Physician Assistant

## 2024-04-11 ENCOUNTER — Encounter: Admitting: Physical Therapy

## 2024-04-11 VITALS — BP 123/78 | HR 99 | Temp 97.9°F | Wt 177.8 lb

## 2024-04-11 DIAGNOSIS — S45001S Unspecified injury of axillary artery, right side, sequela: Secondary | ICD-10-CM

## 2024-04-11 MED ORDER — OXYCODONE-ACETAMINOPHEN 5-325 MG PO TABS
1.0000 | ORAL_TABLET | ORAL | 0 refills | Status: DC | PRN
  Filled 2024-04-11: qty 15, 3d supply, fill #0

## 2024-04-11 MED ORDER — FLUCONAZOLE 150 MG PO TABS
150.0000 mg | ORAL_TABLET | Freq: Every day | ORAL | 0 refills | Status: DC
  Filled 2024-04-11: qty 5, 5d supply, fill #0

## 2024-04-11 NOTE — Progress Notes (Signed)
 POST OPERATIVE OFFICE NOTE    CC:  F/u for surgery  HPI:  This is a 58 y.o. male who is s/p open exposure and repair of left axillary psa and evacuation of left axillary hematoma on 03/12/2024 by Dr. Serene after he had dislocated his shoulder and reduced. He developed swelling and pain in the left chest and axillary region.    He was readmitted and underwent aortogram with left axillary artery angioplasty and stenting and I&D of left axillary hematoma 03/24/2024 by Dr. Magda.   He was found to have -intra-clavicular wound with rare Enterococcus faecalis and left axilla wound with rare Streptococcus constellatus, both gram positive.  ID was consulted for outpatient IV abx.  CT a/p revealed no acute findings in the abdomen or pelvis.  Their plan is for 6 weeks of Augmentin  given good bioavailability for what appears to be soft tissue infection.  He has f/u with ID on 04/21/2024.   He was seen by his PCP on 04/05/2024 and was reporting significant pain and pain meds were refilled.   Pt returns today for follow up.  Pt states he continues to have pain in the lower part of his arm.  He states that he was told not to get his incisions wet.  He requests pain medication.  He has not seen orthopedics since his shoulder dislocation.  He has hx with Dr. Vernetta as he did his hip replacement.     Allergies  Allergen Reactions   Coreg  Elia.Earls ] Other (See Comments)    Sinus pause on telemetry >3 seconds. Longest one 9 sec. No AV nodal agent   Zestril  [Lisinopril ] Anaphylaxis, Shortness Of Breath and Swelling    Angioedema, required intubation and mechanical ventilation   Chantix  [Varenicline ] Other (See Comments)    Nightmares Insomnia    Amoxil  [Amoxicillin ] Nausea Only    Current Outpatient Medications  Medication Sig Dispense Refill   acetaminophen  (TYLENOL ) 500 MG tablet Take 2 tablets (1,000 mg total) by mouth every 8 (eight) hours as needed for mild pain or fever.     amLODipine  (NORVASC ) 10  MG tablet Take 1 tablet (10 mg total) by mouth daily. 90 tablet 1   amoxicillin -clavulanate (AUGMENTIN ) 875-125 MG tablet Take 1 tablet by mouth every 12 (twelve) hours. 82 tablet 0   Ascorbic Acid  (VITAMIN C  PO) Take 1 tablet by mouth daily.     aspirin  EC 81 MG tablet Take 1 tablet (81 mg total) by mouth daily. Swallow whole. 30 tablet 12   atorvastatin  (LIPITOR ) 80 MG tablet Take 1 tablet (80 mg total) by mouth daily. 30 tablet 2   Cholecalciferol  (VITAMIN D -3 PO) Take 1 capsule by mouth daily.     citalopram  (CELEXA ) 20 MG tablet Take 1 tablet (20 mg total) by mouth daily. 90 tablet 1   clopidogrel  (PLAVIX ) 75 MG tablet Take 1 tablet (75 mg total) by mouth daily. 30 tablet 0   dapagliflozin  propanediol (FARXIGA ) 10 MG TABS tablet Take 1 tablet (10 mg total) by mouth daily. 30 tablet 0   ezetimibe  (ZETIA ) 10 MG tablet Take 1 tablet (10 mg total) by mouth daily. 90 tablet 1   Ferrous Sulfate  (IRON PO) Take 1 tablet by mouth daily.     furosemide  (LASIX ) 40 MG tablet Take 40 mg by mouth daily as needed (excessive swelling).     MAGNESIUM  PO Take 1 tablet by mouth 2 (two) times daily.     metFORMIN  (GLUCOPHAGE ) 500 MG tablet Take 1 tablet (500 mg  total) by mouth 2 (two) times daily with a meal. 60 tablet 0   naproxen  (NAPROSYN ) 375 MG tablet Take 1 tablet (375 mg total) by mouth 2 (two) times daily as needed for moderate pain (pain score 4-6). 20 tablet 0   nitroGLYCERIN  (NITROSTAT ) 0.4 MG SL tablet Place 1 tablet (0.4 mg total) under the tongue every 5 (five) minutes x 3 doses as needed (if no relief after 3rd dose, proceed to ED or call 911). 25 tablet 3   oxyCODONE  (OXY IR/ROXICODONE ) 5 MG immediate release tablet Take 1 tablet (5 mg total) by mouth every 8 (eight) hours as needed for severe pain (pain score 7-10). 15 tablet 0   pantoprazole  (PROTONIX ) 40 MG tablet TAKE (1) TABLET BY MOUTH TWICE DAILY BEFORE A MEAL. 180 tablet 1   RESTASIS  0.05 % ophthalmic emulsion Place 1 drop into both eyes 2  (two) times daily.     SYMBICORT  160-4.5 MCG/ACT inhaler INHALE 2 PUFFS INTO THE LUNGS FIRST THING IN THE MORNING AND 2 PUFFS ABOUT 12 HOURS LATER. 10.2 g 12   TRULICITY  1.5 MG/0.5ML SOAJ Inject 1.5 mg into the skin once a week. 2 mL 1   No current facility-administered medications for this visit.     ROS:  See HPI  Physical Exam:  Today's Vitals   04/11/24 1325  BP: 123/78  Pulse: 99  Temp: 97.9 F (36.6 C)  TempSrc: Temporal  Weight: 177 lb 12.8 oz (80.6 kg)  PainSc: 10-Worst pain ever  PainLoc: Arm   Body mass index is 29.59 kg/m.   Incision:  left chest incision looks good; left axillary incision is beefy red and malodorous Extremities:  brisk multiphasic doppler flow left radial and ulnar.  Radial is also palpable.  Swelling in left hand improved from when he was hospitalized.       Assessment/Plan:  This is a 58 y.o. male who is s/p:  open exposure and repair of left axillary psa and evacuation of left axillary hematoma on 03/12/2024 by Dr. Serene after he had dislocated his shoulder and reduced. He developed swelling and pain in the left chest and axillary region.    He was readmitted and underwent aortogram with left axillary artery angioplasty and stenting and I&D of left axillary hematoma 03/24/2024 by Dr. Magda.   He was found to have -intra-clavicular wound with rare Enterococcus faecalis and left axilla wound with rare Streptococcus constellatus, both gram positive.  ID was consulted for outpatient abx and he was placed on Augmentin  x 6 weeks.   -he has brisk doppler flow left radial and ulnar arteries.  Swelling in hand much improved.  -left axillary incision with yeast-diflucan  150mg  daily x 5 days prescribed.  I took the nylon sutures out of the chest incision and left the staples.  I did not remove the staples or nylons from axillary incision.  I have asked him to cleanse with soap and water  and keep dry gauze in axilla to wick moisture and replace daily and as  needed given the yeast that is present.   -he will f/u in one week for wound check and hopefully remove sutures.  -I did put in referral to Dr. Vernetta for further evaluation of his LUE.   -I did sent Percocet 5/325 one po q6h prn pain #15 no refill.  Discussed with pt that we will not be giving any further refills on his pain medication.  Both this and diflucan  were sent to [facility] community pharmacy downstairs.  -continue  asa/statin/Plavix    Lucie Apt, [facility] [phone]   Clinic MD:  Serene

## 2024-04-13 ENCOUNTER — Ambulatory Visit: Admitting: Physical Therapy

## 2024-04-13 ENCOUNTER — Encounter: Payer: Self-pay | Admitting: Physical Therapy

## 2024-04-13 DIAGNOSIS — M25512 Pain in left shoulder: Secondary | ICD-10-CM

## 2024-04-13 DIAGNOSIS — M6281 Muscle weakness (generalized): Secondary | ICD-10-CM

## 2024-04-13 NOTE — Therapy (Signed)
 OUTPATIENT PHYSICAL THERAPY TREATMENT   Patient Name: Randy Hebert MRN: [id number] DOB:Jun 01, 1966, 58 y.o., male Today's Date: 04/13/2024  END OF SESSION:  PT End of Session - 04/13/24 0824     Visit Number 2    Number of Visits 20    Date for PT Re-Evaluation 06/23/24    Progress Note Due on Visit 10    PT Start Time 0755    PT Stop Time 0833    PT Time Calculation (min) 38 min    Activity Tolerance Patient tolerated treatment well;Patient limited by pain    Behavior During Therapy [facility] for tasks assessed/performed           Past Medical History:  Diagnosis Date   ACE inhibitor-aggravated angioedema    Allergy     Anemia    Angio-edema    Anxiety    Arthritis    Asthma    hip replacement   Back pain    Bradycardia 04/28/2017   Bulging of cervical intervertebral disc    CAD (coronary artery disease)    lateral STEMI 02/06/2015 00% D1 occlusion treated with Promus Premier 2.5 mm x 16 mm DES, 70% ramus stenosis, 40% mid RCA stenosis, 45% distal RCA stenosis, EF 45-50%   CHF (congestive heart failure) (HCC)    COPD (chronic obstructive pulmonary disease) (HCC)    Depression    Diabetes mellitus without complication (HCC)    Difficult intubation    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   Dry eye    Dyspnea    Early satiety 09/23/2016   Fatty liver    GERD (gastroesophageal reflux disease)    HCAP (healthcare-associated pneumonia) 05/15/2017   Headache    Heart murmur    Hip pain    Hyperlipidemia    Hypertension    Lobar pneumonia (HCC) 05/15/2017   Melena 08/04/2018   MI (myocardial infarction) (HCC)    Myocardial infarction (HCC)    Neck pain    Non-ST elevation (NSTEMI) myocardial infarction (HCC) 04/27/2017   NSTEMI (non-ST elevated myocardial infarction) (HCC) 04/26/2017   Otitis media    Pleurisy    Pneumonia due to COVID-19 virus 10/07/2019   Rectal bleeding 11/08/2018   Right shoulder pain 03/20/2020   Sinus pause    9 sec sinus  pause on telemetry after started on coreg  after MI, avoid AV nodal blocking agent   Sleep apnea    uses a cpap   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   STEMI (ST elevation myocardial infarction) (HCC) 05/31/2015   Substance abuse (HCC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   Syncope 06/29/2017   Syncope and collapse 05/15/2017   Transaminitis 08/04/2018   Unilateral primary osteoarthritis, right hip 07/01/2016   Past Surgical History:  Procedure Laterality Date   ARTERY EXPLORATION Left 03/24/2024   Procedure: LEFT ARCH AORTOGRAM, LEFT UPPER EXTREMITY ANGIOGRAM WITH FIRST ORDER CANNULATION, LEFT AXILLARY ANGIOPLASTY AND STENTING, EVACUATION OF LEFT AXILLARY HEMATOMA;  Surgeon: Magda Debby SAILOR, MD;  Location: [facility] OR;  Service: Vascular;  Laterality: Left;   ARTERY REPAIR Left 03/11/2024   Procedure: REPAIR LEFT AXILLARY ARTERY;  Surgeon: Serene Gaile ORN, MD;  Location: [facility] OR;  Service: Vascular;  Laterality: Left;  ARTERIAL REPAIR, LEFT AXILLARY   BIOPSY  10/09/2016   Procedure: BIOPSY;  Surgeon: Lamar CHRISTELLA Hollingshead, MD;  Location: [facility] ENDO SUITE;  Service: Endoscopy;;   BIOPSY  11/28/2019   Procedure: BIOPSY;  Surgeon: Hollingshead Lamar CHRISTELLA, MD;  Location: [facility]  ENDO SUITE;  Service: Endoscopy;;   BIOPSY  06/15/2023   Procedure: BIOPSY;  Surgeon: Shaaron Lamar HERO, MD;  [facility]: [facility];  Service: Endoscopy;;   BRONCHIAL BIOPSY  10/16/2022   Procedure: BRONCHIAL BIOPSIES;  Surgeon: Gladis Leonor HERO, MD;  Location: [facility] ENDOSCOPY;  Service: Pulmonary;;   BRONCHIAL NEEDLE ASPIRATION BIOPSY  10/16/2022   Procedure: BRONCHIAL NEEDLE ASPIRATION BIOPSIES;  Surgeon: Gladis Leonor HERO, MD;  Location: [facility] ENDOSCOPY;  Service: Pulmonary;;   BRONCHIAL WASHINGS  10/16/2022   Procedure: BRONCHIAL WASHINGS;  Surgeon: Gladis Leonor HERO, MD;  Location: [facility] ENDOSCOPY;  Service: Pulmonary;;   CARDIAC CATHETERIZATION N/A 02/06/2015   Procedure: Left Heart Cath and Coronary Angiography;  Surgeon: Alm LELON Clay, MD;   Location: [facility] INVASIVE CV LAB;  Service: Cardiovascular;  Laterality: N/A;   CARDIAC CATHETERIZATION N/A 02/06/2015   Procedure: Coronary Stent Intervention;  Surgeon: Alm LELON Clay, MD;  Location: [facility] INVASIVE CV LAB;  Service: Cardiovascular;  Laterality: N/A;   COLONOSCOPY WITH PROPOFOL  N/A 10/09/2016   Sigmoid and descending colon diverticulosis, four 4-6 mm polyps in sigmoid, one 4 mm polyp in descending. Tubular adenomas and hyperplastic. 5 year surveillance.    COLONOSCOPY WITH PROPOFOL  N/A 11/24/2016   Sigmoid and descending colon diverticulosis, four 4-6 mm polyps in sigmoid, one 4 mm polyp in descending. Tubular adenomas and hyperplastic. 5 year surveillance.    COLONOSCOPY WITH PROPOFOL  N/A 10/18/2021   Carver: nonbleeding internal hemorrhoids, small and large mouth diverticula found in the sigmoid, descending, transverse colon.  A 9 mm sessile polyp was found in the ascending colon that was removed.  4 sessile polyps found in the sigmoid, descending, transverse colon 3 to 5 mm in size, examining otherwise.  Path revealed tubular adenomas.  Repeat due in 5 years for surveillance.   CORONARY ANGIOPLASTY WITH STENT PLACEMENT  01/2015   CORONARY STENT INTERVENTION N/A 04/27/2017   Procedure: CORONARY STENT INTERVENTION;  Surgeon: Mady Bruckner, MD;  Location: [facility] INVASIVE CV LAB;  Service: Cardiovascular;  Laterality: N/A;   ELECTROPHYSIOLOGY STUDY N/A 06/29/2017   Procedure: ELECTROPHYSIOLOGY STUDY;  Surgeon: Waddell Danelle LELON, MD;  Location: [facility] INVASIVE CV LAB;  Service: Cardiovascular;  Laterality: N/A;   ESOPHAGOGASTRODUODENOSCOPY (EGD) WITH PROPOFOL  N/A 10/09/2016   Dr. Shaaron: LA grade a esophagitis.  Barrett's esophagus, biopsy-proven.  Small hiatal hernia.  EGD February 2019   ESOPHAGOGASTRODUODENOSCOPY (EGD) WITH PROPOFOL  N/A 11/28/2019    salmon-colored esophageal mucosa (Barrett's) small hiatal hernia, portal hypertensive gastropathy, normal duodenum, 3 year surveillance    ESOPHAGOGASTRODUODENOSCOPY (EGD) WITH PROPOFOL  N/A 09/06/2021   three columns of grade 1 varices in distal esophagus, no stigmata of bleeding or red wale signs. Small hiatal hernia. Mild portal gastropathy. Normal duodenum. Repeat upper endoscopy in 1 year for surveillance.   ESOPHAGOGASTRODUODENOSCOPY (EGD) WITH PROPOFOL  N/A 06/15/2023   Procedure: ESOPHAGOGASTRODUODENOSCOPY (EGD) WITH PROPOFOL ;  Surgeon: Shaaron Lamar HERO, MD;  [facility];  Service: Endoscopy;  Laterality: N/A;  830am, asa 3   FIDUCIAL MARKER PLACEMENT  10/16/2022   Procedure: FIDUCIAL MARKER PLACEMENT;  Surgeon: Gladis Leonor HERO, MD;  Location: [facility] ENDOSCOPY;  Service: Pulmonary;;   INCISION / DRAINAGE HAND / FINGER     LEFT HEART CATH AND CORONARY ANGIOGRAPHY N/A 04/27/2017   Procedure: LEFT HEART CATH AND CORONARY ANGIOGRAPHY;  Surgeon: Mady Bruckner, MD;  Location: [facility] INVASIVE CV LAB;  Service: Cardiovascular;  Laterality: N/A;   LOOP RECORDER INSERTION  06/29/2017   Procedure: Loop Recorder Insertion;  Surgeon: Waddell Danelle LELON,  MD;  Location: [facility] INVASIVE CV LAB;  Service: Cardiovascular;;   POLYPECTOMY  11/24/2016   Procedure: POLYPECTOMY;  Surgeon: Lamar CHRISTELLA Hollingshead, MD;  [facility];  Service: Endoscopy;;  descending and sigmoid   POLYPECTOMY  10/18/2021   Procedure: POLYPECTOMY;  Surgeon: Cindie Carlin POUR, DO;  [facility]: [facility];  Service: Endoscopy;;   TOTAL HIP ARTHROPLASTY Right 07/01/2016   TOTAL HIP ARTHROPLASTY Right 07/01/2016   Procedure: RIGHT TOTAL HIP ARTHROPLASTY ANTERIOR APPROACH;  Surgeon: Lonni CINDERELLA Poli, MD;  Location: [facility] OR;  Service: Orthopedics;  Laterality: Right;   ULTRASOUND GUIDANCE FOR VASCULAR ACCESS Left 03/24/2024   Procedure: ULTRASOUND GUIDANCE, FOR VASCULAR ACCESS;  Surgeon: Magda Debby SAILOR, MD;  Location: [facility] OR;  Service: Vascular;  Laterality: Left;   Patient Active Problem List   Diagnosis Date Noted   Acute postoperative anemia due to greater than  expected blood loss 03/26/2024   Obesity, class 1 03/26/2024   Aneurysm of axillary artery (HCC) 03/24/2024   Axillary artery injury 03/12/2024   Hypokalemia 03/03/2024   Acute CVA (cerebrovascular accident) (HCC) 03/02/2024   Essential hypertension 03/02/2024   Dyslipidemia 03/02/2024   Type 2 diabetes mellitus with hyperlipidemia (HCC) 03/02/2024   Depression 03/02/2024   Acute right-sided low back pain with right-sided sciatica 11/30/2023   Anxiety 04/23/2023   Erectile dysfunction 02/24/2023   Chronic eczematous otitis externa of both ears 02/05/2023   Tobacco abuse 12/25/2022   Malignant neoplasm of upper lobe of right lung (HCC) 09/25/2022   Cervical spondylosis with myelopathy 08/01/2022   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   Vocal cord dysfunction 05/21/2022   DDD (degenerative disc disease), cervical 05/21/2022   Osteoarthritis of both hands 05/19/2022   History of GI bleed 11/13/2021   Insomnia 09/13/2021   Cirrhosis of liver (HCC) 11/08/2018   Barrett's esophagus 08/04/2018   Vitamin D  deficiency 06/30/2018   Alcohol  abuse 06/30/2018   DM type 2 causing vascular disease (HCC) 08/17/2017   Chronic combined systolic and diastolic CHF (congestive heart failure) (HCC) 05/15/2017   OSA (obstructive sleep apnea) 04/28/2017   Depression with anxiety 04/15/2017   GERD (gastroesophageal reflux disease) 09/23/2016   Asthmatic bronchitis , chronic (HCC) 08/27/2015   Mixed hyperlipidemia 08/27/2015   Class 1 obesity due to excess calories with serious comorbidity and body mass index (BMI) of 32.0 to 32.9 in adult 08/27/2015   CAD (coronary artery disease) 05/31/2015    PCP: Cook, Jayce G, DO   REFERRING PROVIDER: Noralee Elidia Sieving*   REFERRING DIAG: left axillary pseudoaneurysm   Rationale for Evaluation and Treatment:  Rehabiliation  THERAPY DIAG:  Acute pain of left shoulder  Muscle weakness (generalized)  ONSET DATE: 03/11/24   SUBJECTIVE:  SUBJECTIVE STATEMENT: He states he has not had his elbow/wrist examined yet, he is supposed to be getting a referral to ortho for this. He was not able to get staples removed due to yeast infection/rash around the site. He is supposed to clean this area with soap and water  he says.   PERTINENT HISTORY:  past medical history of diabetes mellitus type 2, hypertension, CAD, liver cirrhosis, anxiety, depression, OSA, CVA w/ left-sided weakness who had a recent hospitalization 7/11 to 03/19/24 post traumatic left shoulder dislocation, complicated with left axillary hematoma with left axillary pseudoaneurysm. On 07/12 he underwent open exposure and repair of the left axillary pseudoaneurysm and evacuation of left axillary hematoma.   PAIN:  NPRS scale: 6/10 upon arrival Pain location:left shoulder/axillary, left forearm and wrist Pain description: constant, achy, sharp Aggravating factors: any use of this left arm. Relieving factors: rest, meds   PRECAUTIONS: ,  Shoulder  RED FLAGS: None   WEIGHT BEARING RESTRICTIONS:  He is unsure so PT will keep him NWB for his left UE for now.  FALLS:  Has patient fallen in last 6 months? Yes. Number of falls 1   OCCUPATION:  disabled  PLOF:  Independent with basic ADLs  PATIENT GOALS:  Reduce pain and get his arm back to normal  OBJECTIVE:  Note: Objective measures were completed at Evaluation unless otherwise noted.  DIAGNOSTIC FINDINGS:  Chest CT IMPRESSION: 1. Enlarging left axillary hematoma with arterial contrast extravasation from the posterior wall of the left axillary artery into a 1.5 x 1.8 cm pseudoaneurysm, with additional 1.8 x 1.1 cm pocket of contrast more posteriorly within the collection. 2. Increased heterogeneous hematoma in the left subscapularis  region measuring 7.3 x 4.8 cm, but no focal contrast is seen within this. 3. Emphysema. 4. Right apical fiducial markers and post XRT changes, with treated nodule just above the fiducial markers today measuring 8 mm, previously 9 mm. 5. Aortic and coronary artery atherosclerosis.  Right aortic arch. 6. Enlarged pulmonary trunk 3.3 cm indicating arterial hypertension. 7. Cirrhosis and splenomegaly.  Prominent patent portal vein 1.7 cm. 8. Critical Value/emergent results were called by telephone at the time of interpretation on 03/24/2024 at 5:18 am to provider [facility] , who verbally acknowledged these results.  Shoulder XR IMPRESSION: No acute bony abnormality.   Postoperative changes in the left axilla.  No imaging for left elbow/wrist  PATIENT SURVEYS:  Patient-Specific Activity Scoring Scheme  0 represents "unable to perform." 10 represents "able to perform at prior level. 0 1 2 3 4 5 6 7 8 9  10 (Date and Score)   Activity Eval     1. Raising left arm  0    2. Putting on and off shirt 1    3.     4.    5.    Score 0.5/10    Total score = sum of the activity scores/number of activities Minimum detectable change (90%CI) for average score = 2 points Minimum detectable change (90%CI) for single activity score = 3 points     EDEMA:  Yes: moderate edema noted in left upper extremity from shoulder to hand  Observation:  2 incisions noted, one at proximal chest, one at axilla, both incisions closed with stitches and appear closed without any drainage or signs of infection at the moment. Dressing was removed and changed for him at eval.     UPPER EXTREMITY ROM:  AROM/Passive ROM Left eval Left 04/13/24  Shoulder flexion /50 /120  Shoulder  extension    Shoulder abduction /50 /100  Shoulder adduction    Shoulder extension    Shoulder internal rotation /50   Shoulder external rotation /40 /70  Elbow flexion    Elbow extension    Wrist flexion    Wrist  extension    Wrist ulnar deviation    Wrist radial deviation    Wrist pronation    Wrist supination     (Blank rows = not tested)   UPPER EXTREMITY MMT:  MMT Left eval   Shoulder flexion 1   Shoulder extension    Shoulder abduction 1   Shoulder adduction    Shoulder extension    Shoulder internal rotation 1   Shoulder external rotation 1   Middle trapezius    Lower trapezius    Elbow flexion 1   Elbow extension 2   Wrist flexion    Wrist extension    Wrist ulnar deviation    Wrist radial deviation    Wrist pronation    Wrist supination    Grip strength     (Blank rows = not tested)                                                                                                                              TREATMENT DATE:  04/13/24 HEP review with demonstration and cuing provided Supine cane AAROM flexion X 10 (needs help to keep his hand wrapped around the cane as he can not make full fist to grip and maintain grip) Supine cane AAROM abduction X 10 (needs help to keep his hand wrapped around the cane as he can not make full fist to grip and maintain grip) Supine cane AAROM ER X 10 (needs help to keep his hand wrapped around the cane as he can not make full fist to grip and maintain grip) Supine left elbow AROM X 10 reps Supine scapular retraction X 10 reps Supine chest press AROM on left X 10 reps Supine AAROM with hands clasped X 10 reps flexion Standing pendulums X 15 circles CW, CCW, flexion-extension, and abduction-adduction  Manual therapy: Left shoulder PROM in all planes   PATIENT EDUCATION: Education details: HEP, PT plan of care, selfcare Person educated: Patient Education method: Explanation, Demonstration, Verbal cues, and Handouts Education comprehension: verbalized understanding, further education recommended   HOME EXERCISE PROGRAM: Access Code: [email or web address] URL: [email or web address] Date: 03/31/2024 Prepared by: Redell Moose  Exercises - Circular Shoulder Pendulum with Table Support  - 2-3 x daily - 7 x weekly - 1 sets - 15 reps - Seated Shoulder Abduction Towel Slide at Table Top  - 2-3 x daily - 6 x weekly - 1 sets - 10 reps - 5 sec hold - Seated Shoulder Flexion Towel Slide at Table Top  - 2-3 x daily - 6 x weekly - 1 sets - 10 reps - 5 sec hold - Seated Shoulder External Rotation PROM on Table  - 2-3 x  daily - 6 x weekly - 1 sets - 10 reps - 5 sec hold - Seated Gripping Towel  - 2-3 x daily - 6 x weekly - 1 sets - 20 reps - Standing Elbow Flexion Extension AROM  - 2-3 x daily - 6 x weekly - 1 sets - 10 reps  ASSESSMENT:  CLINICAL IMPRESSION: He was observed to have red rash around his incisions. He states he is supposed to clean this with soap and water  but unable to do this by himself so PT did do this for him today. I reviewed HEP with him today and added some supine wand AAROM exercises to help him with ROM. He did show improved PROM today overall but has very limited AROM still.   OBJECTIVE IMPAIRMENTS: decreased activity tolerance for ADL's, difficulty walking, decreased balance, decreased endurance, decreased mobility, decreased ROM, decreased strength, impaired flexibility, impaired UE use, and pain.  ACTIVITY LIMITATIONS: bending, lifting, carry, reaching, locomotion, cleaning, community activity, driving,  PERSONAL FACTORS: see above PMH  also affecting patient's functional outcome.  REHAB POTENTIAL: Good  CLINICAL DECISION MAKING: Evolving/moderate complexity  EVALUATION COMPLEXITY: Moderate    GOALS: Short term PT Goals Target date: 04/28/2024   Pt will be I and compliant with HEP. Baseline:  Goal status: New Pt will decrease pain by 25% overall Baseline: Goal status: New  Long term PT goals Target date:06/23/2024   Pt will improve right shoulder and elbow AROM to [facility] to improve functional mobility Baseline: Goal status: New Pt will improve  right arm strength to at least  4+/5 MMT to improve functional strength Baseline: Goal status: New Pt will improve Patient specific functional scale (PSFS) to at least 7/10 to show improved function level Baseline: Goal status: New Pt will reduce pain to overall less than 3/10 with usual activity Baseline: Goal status: New PLAN: PT FREQUENCY: 1-3 times per week   PT DURATION: 6-8 weeks  PLANNED INTERVENTIONS (unless contraindicated):  97110-Therapeutic exercises, 97530- Therapeutic activity, [id number]- Neuromuscular re-education, 97535- Self Care, [id number]- Manual therapy, [id number]- Electrical stimulation (unattended), [id number]- Vasopneumatic device, [id number]- Ultrasound, and 97033- Ionotophoresis 4mg /ml Dexamethasone   PLAN FOR NEXT SESSION: incision check, AAROM and PROM as tolerated   Redell JONELLE Moose, PT,DPT 04/13/2024, 8:37 AM

## 2024-04-15 ENCOUNTER — Other Ambulatory Visit: Payer: Self-pay | Admitting: "Endocrinology

## 2024-04-15 DIAGNOSIS — E1159 Type 2 diabetes mellitus with other circulatory complications: Secondary | ICD-10-CM

## 2024-04-18 ENCOUNTER — Ambulatory Visit: Admitting: Physical Therapy

## 2024-04-18 ENCOUNTER — Encounter: Payer: Self-pay | Admitting: Physical Therapy

## 2024-04-18 ENCOUNTER — Encounter: Payer: Self-pay | Admitting: Surgery

## 2024-04-18 ENCOUNTER — Ambulatory Visit: Attending: Surgery | Admitting: Surgery

## 2024-04-18 VITALS — BP 133/79 | HR 94 | Temp 98.0°F | Resp 18 | Ht 65.0 in | Wt 178.0 lb

## 2024-04-18 DIAGNOSIS — M25512 Pain in left shoulder: Secondary | ICD-10-CM | POA: Diagnosis not present

## 2024-04-18 DIAGNOSIS — S45001S Unspecified injury of axillary artery, right side, sequela: Secondary | ICD-10-CM

## 2024-04-18 DIAGNOSIS — M6281 Muscle weakness (generalized): Secondary | ICD-10-CM

## 2024-04-18 NOTE — Therapy (Addendum)
 OUTPATIENT PHYSICAL THERAPY TREATMENT   Patient Name: Randy Hebert MRN: [id number] DOB:Jan 17, 1966, 58 y.o., male Today's Date: 04/18/2024  END OF SESSION:  PT End of Session - 04/18/24 0804     Visit Number 3    Number of Visits 20    Date for PT Re-Evaluation 06/23/24    Progress Note Due on Visit 10    PT Start Time 0755    PT Stop Time 0833    PT Time Calculation (min) 38 min    Activity Tolerance Patient tolerated treatment well;Patient limited by pain    Behavior During Therapy [facility] for tasks assessed/performed           Past Medical History:  Diagnosis Date   ACE inhibitor-aggravated angioedema    Allergy     Anemia    Angio-edema    Anxiety    Arthritis    Asthma    hip replacement   Back pain    Bradycardia 04/28/2017   Bulging of cervical intervertebral disc    CAD (coronary artery disease)    lateral STEMI 02/06/2015 00% D1 occlusion treated with Promus Premier 2.5 mm x 16 mm DES, 70% ramus stenosis, 40% mid RCA stenosis, 45% distal RCA stenosis, EF 45-50%   CHF (congestive heart failure) (HCC)    COPD (chronic obstructive pulmonary disease) (HCC)    Depression    Diabetes mellitus without complication (HCC)    Difficult intubation    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   Dry eye    Dyspnea    Early satiety 09/23/2016   Fatty liver    GERD (gastroesophageal reflux disease)    HCAP (healthcare-associated pneumonia) 05/15/2017   Headache    Heart murmur    Hip pain    Hyperlipidemia    Hypertension    Lobar pneumonia (HCC) 05/15/2017   Melena 08/04/2018   MI (myocardial infarction) (HCC)    Myocardial infarction (HCC)    Neck pain    Non-ST elevation (NSTEMI) myocardial infarction (HCC) 04/27/2017   NSTEMI (non-ST elevated myocardial infarction) (HCC) 04/26/2017   Otitis media    Pleurisy    Pneumonia due to COVID-19 virus 10/07/2019   Rectal bleeding 11/08/2018   Right shoulder pain 03/20/2020   Sinus pause    9 sec sinus  pause on telemetry after started on coreg  after MI, avoid AV nodal blocking agent   Sleep apnea    uses a cpap   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   STEMI (ST elevation myocardial infarction) (HCC) 05/31/2015   Substance abuse (HCC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   Syncope 06/29/2017   Syncope and collapse 05/15/2017   Transaminitis 08/04/2018   Unilateral primary osteoarthritis, right hip 07/01/2016   Past Surgical History:  Procedure Laterality Date   ARTERY EXPLORATION Left 03/24/2024   Procedure: LEFT ARCH AORTOGRAM, LEFT UPPER EXTREMITY ANGIOGRAM WITH FIRST ORDER CANNULATION, LEFT AXILLARY ANGIOPLASTY AND STENTING, EVACUATION OF LEFT AXILLARY HEMATOMA;  Surgeon: Magda Debby SAILOR, MD;  Location: [facility] OR;  Service: Vascular;  Laterality: Left;   ARTERY REPAIR Left 03/11/2024   Procedure: REPAIR LEFT AXILLARY ARTERY;  Surgeon: Serene Gaile ORN, MD;  Location: [facility] OR;  Service: Vascular;  Laterality: Left;  ARTERIAL REPAIR, LEFT AXILLARY   BIOPSY  10/09/2016   Procedure: BIOPSY;  Surgeon: Lamar CHRISTELLA Hollingshead, MD;  Location: [facility] ENDO SUITE;  Service: Endoscopy;;   BIOPSY  11/28/2019   Procedure: BIOPSY;  Surgeon: Hollingshead Lamar CHRISTELLA, MD;  Location: [facility]  ENDO SUITE;  Service: Endoscopy;;   BIOPSY  06/15/2023   Procedure: BIOPSY;  Surgeon: Shaaron Lamar HERO, MD;  Location: [facility] ENDO SU[facility];  Service: Endoscopy;;   BRONCHIAL BIOPSY  10/16/2022   Procedure: BRONCHIAL BIOPSIES;  Surgeon: Gladis Leonor HERO, MD;  Location: [facility] ENDOSCOPY;  Service: Pulmonary;;   BRONCHIAL NEEDLE ASPIRATION BIOPSY  10/16/2022   Procedure: BRONCHIAL NEEDLE ASPIRATION BIOPSIES;  Surgeon: Gladis Leonor HERO, MD;  Location: [facility] ENDOSCOPY;  Service: Pulmonary;;   BRONCHIAL WASHINGS  10/16/2022   Procedure: BRONCHIAL WASHINGS;  Surgeon: Gladis Leonor HERO, MD;  Location: [facility] ENDOSCOPY;  Service: Pulmonary;;   CARDIAC CATHETERIZATION N/A 02/06/2015   Procedure: Left Heart Cath and Coronary Angiography;  Surgeon: Alm LELON Clay, MD;   Location: [facility] INVASIVE CV LAB;  Service: Cardiovascular;  Laterality: N/A;   CARDIAC CATHETERIZATION N/A 02/06/2015   Procedure: Coronary Stent Intervention;  Surgeon: Alm LELON Clay, MD;  Location: [facility] INVASIVE CV LAB;  Service: Cardiovascular;  Laterality: N/A;   COLONOSCOPY WITH PROPOFOL  N/A 10/09/2016   Sigmoid and descending colon diverticulosis, four 4-6 mm polyps in sigmoid, one 4 mm polyp in descending. Tubular adenomas and hyperplastic. 5 year surveillance.    COLONOSCOPY WITH PROPOFOL  N/A 11/24/2016   Sigmoid and descending colon diverticulosis, four 4-6 mm polyps in sigmoid, one 4 mm polyp in descending. Tubular adenomas and hyperplastic. 5 year surveillance.    COLONOSCOPY WITH PROPOFOL  N/A 10/18/2021   Carver: nonbleeding internal hemorrhoids, small and large mouth diverticula found in the sigmoid, descending, transverse colon.  A 9 mm sessile polyp was found in the ascending colon that was removed.  4 sessile polyps found in the sigmoid, descending, transverse colon 3 to 5 mm in size, examining otherwise.  Path revealed tubular adenomas.  Repeat due in 5 years for surveillance.   CORONARY ANGIOPLASTY WITH STENT PLACEMENT  01/2015   CORONARY STENT INTERVENTION N/A 04/27/2017   Procedure: CORONARY STENT INTERVENTION;  Surgeon: Mady Bruckner, MD;  Location: [facility] INVASIVE CV LAB;  Service: Cardiovascular;  Laterality: N/A;   ELECTROPHYSIOLOGY STUDY N/A 06/29/2017   Procedure: ELECTROPHYSIOLOGY STUDY;  Surgeon: Waddell Danelle LELON, MD;  Location: [facility] INVASIVE CV LAB;  Service: Cardiovascular;  Laterality: N/A;   ESOPHAGOGASTRODUODENOSCOPY (EGD) WITH PROPOFOL  N/A 10/09/2016   Dr. Shaaron: LA grade a esophagitis.  Barrett's esophagus, biopsy-proven.  Small hiatal hernia.  EGD February 2019   ESOPHAGOGASTRODUODENOSCOPY (EGD) WITH PROPOFOL  N/A 11/28/2019    salmon-colored esophageal mucosa (Barrett's) small hiatal hernia, portal hypertensive gastropathy, normal duodenum, 3 year surveillance    ESOPHAGOGASTRODUODENOSCOPY (EGD) WITH PROPOFOL  N/A 09/06/2021   three columns of grade 1 varices in distal esophagus, no stigmata of bleeding or red wale signs. Small hiatal hernia. Mild portal gastropathy. Normal duodenum. Repeat upper endoscopy in 1 year for surveillance.   ESOPHAGOGASTRODUODENOSCOPY (EGD) WITH PROPOFOL  N/A 06/15/2023   Procedure: ESOPHAGOGASTRODUODENOSCOPY (EGD) WITH PROPOFOL ;  Surgeon: Shaaron Lamar HERO, MD;  [facility];  Service: Endoscopy;  Laterality: N/A;  830am, asa 3   FIDUCIAL MARKER PLACEMENT  10/16/2022   Procedure: FIDUCIAL MARKER PLACEMENT;  Surgeon: Gladis Leonor HERO, MD;  Location: [facility] ENDOSCOPY;  Service: Pulmonary;;   INCISION / DRAINAGE HAND / FINGER     LEFT HEART CATH AND CORONARY ANGIOGRAPHY N/A 04/27/2017   Procedure: LEFT HEART CATH AND CORONARY ANGIOGRAPHY;  Surgeon: Mady Bruckner, MD;  Location: [facility] INVASIVE CV LAB;  Service: Cardiovascular;  Laterality: N/A;   LOOP RECORDER INSERTION  06/29/2017   Procedure: Loop Recorder Insertion;  Surgeon: Waddell Danelle LELON,  MD;  Location: [facility] INVASIVE CV LAB;  Service: Cardiovascular;;   POLYPECTOMY  11/24/2016   Procedure: POLYPECTOMY;  Surgeon: Lamar CHRISTELLA Hollingshead, MD;  [facility];  Service: Endoscopy;;  descending and sigmoid   POLYPECTOMY  10/18/2021   Procedure: POLYPECTOMY;  Surgeon: Cindie Carlin POUR, DO;  Location: [facility] END[facility];  Service: Endoscopy;;   TOTAL HIP ARTHROPLASTY Right 07/01/2016   TOTAL HIP ARTHROPLASTY Right 07/01/2016   Procedure: RIGHT TOTAL HIP ARTHROPLASTY ANTERIOR APPROACH;  Surgeon: Lonni CINDERELLA Poli, MD;  Location: [facility] OR;  Service: Orthopedics;  Laterality: Right;   ULTRASOUND GUIDANCE FOR VASCULAR ACCESS Left 03/24/2024   Procedure: ULTRASOUND GUIDANCE, FOR VASCULAR ACCESS;  Surgeon: Magda Debby SAILOR, MD;  Location: [facility] OR;  Service: Vascular;  Laterality: Left;   Patient Active Problem List   Diagnosis Date Noted   Acute postoperative anemia due to greater than  expected blood loss 03/26/2024   Obesity, class 1 03/26/2024   Aneurysm of axillary artery (HCC) 03/24/2024   Axillary artery injury 03/12/2024   Hypokalemia 03/03/2024   Acute CVA (cerebrovascular accident) (HCC) 03/02/2024   Essential hypertension 03/02/2024   Dyslipidemia 03/02/2024   Type 2 diabetes mellitus with hyperlipidemia (HCC) 03/02/2024   Depression 03/02/2024   Acute right-sided low back pain with right-sided sciatica 11/30/2023   Anxiety 04/23/2023   Erectile dysfunction 02/24/2023   Chronic eczematous otitis externa of both ears 02/05/2023   Tobacco abuse 12/25/2022   Malignant neoplasm of upper lobe of right lung (HCC) 09/25/2022   Cervical spondylosis with myelopathy 08/01/2022   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   Vocal cord dysfunction 05/21/2022   DDD (degenerative disc disease), cervical 05/21/2022   Osteoarthritis of both hands 05/19/2022   History of GI bleed 11/13/2021   Insomnia 09/13/2021   Cirrhosis of liver (HCC) 11/08/2018   Barrett's esophagus 08/04/2018   Vitamin D  deficiency 06/30/2018   Alcohol  abuse 06/30/2018   DM type 2 causing vascular disease (HCC) 08/17/2017   Chronic combined systolic and diastolic CHF (congestive heart failure) (HCC) 05/15/2017   OSA (obstructive sleep apnea) 04/28/2017   Depression with anxiety 04/15/2017   GERD (gastroesophageal reflux disease) 09/23/2016   Asthmatic bronchitis , chronic (HCC) 08/27/2015   Mixed hyperlipidemia 08/27/2015   Class 1 obesity due to excess calories with serious comorbidity and body mass index (BMI) of 32.0 to 32.9 in adult 08/27/2015   CAD (coronary artery disease) 05/31/2015    PCP: Cook, Jayce G, DO   REFERRING PROVIDER: Noralee Elidia Sieving*   REFERRING DIAG: left axillary pseudoaneurysm   Rationale for Evaluation and Treatment:  Rehabiliation  THERAPY DIAG:  Acute pain of left shoulder  Muscle weakness (generalized)  ONSET DATE: 03/11/24   SUBJECTIVE:  SUBJECTIVE STATEMENT: He states the pain is a little better overall in his shoulder but is having pain in his elbow and wrist still.   PERTINENT HISTORY:  past medical history of diabetes mellitus type 2, hypertension, CAD, liver cirrhosis, anxiety, depression, OSA, CVA w/ left-sided weakness who had a recent hospitalization 7/11 to 03/19/24 post traumatic left shoulder dislocation, complicated with left axillary hematoma with left axillary pseudoaneurysm. On 07/12 he underwent open exposure and repair of the left axillary pseudoaneurysm and evacuation of left axillary hematoma.   PAIN:  NPRS scale: 6/10 upon arrival Pain location:left shoulder/axillary, left forearm and wrist Pain description: constant, achy, sharp Aggravating factors: any use of this left arm. Relieving factors: rest, meds   PRECAUTIONS: ,  Shoulder  RED FLAGS: None   WEIGHT BEARING RESTRICTIONS:  He is unsure so PT will keep him NWB for his left UE for now.  FALLS:  Has patient fallen in last 6 months? Yes. Number of falls 1   OCCUPATION:  disabled  PLOF:  Independent with basic ADLs  PATIENT GOALS:  Reduce pain and get his arm back to normal  OBJECTIVE:  Note: Objective measures were completed at Evaluation unless otherwise noted.  DIAGNOSTIC FINDINGS:  Chest CT IMPRESSION: 1. Enlarging left axillary hematoma with arterial contrast extravasation from the posterior wall of the left axillary artery into a 1.5 x 1.8 cm pseudoaneurysm, with additional 1.8 x 1.1 cm pocket of contrast more posteriorly within the collection. 2. Increased heterogeneous hematoma in the left subscapularis region measuring 7.3 x 4.8 cm, but no focal contrast is seen within this. 3. Emphysema. 4. Right apical fiducial markers and post XRT changes, with  treated nodule just above the fiducial markers today measuring 8 mm, previously 9 mm. 5. Aortic and coronary artery atherosclerosis.  Right aortic arch. 6. Enlarged pulmonary trunk 3.3 cm indicating arterial hypertension. 7. Cirrhosis and splenomegaly.  Prominent patent portal vein 1.7 cm. 8. Critical Value/emergent results were called by telephone at the time of interpretation on 03/24/2024 at 5:18 am to provider [facility] , who verbally acknowledged these results.  Shoulder XR IMPRESSION: No acute bony abnormality.   Postoperative changes in the left axilla.  No imaging for left elbow/wrist  PATIENT SURVEYS:  Patient-Specific Activity Scoring Scheme  0 represents "unable to perform." 10 represents "able to perform at prior level. 0 1 2 3 4 5 6 7 8 9  10 (Date and Score)   Activity Eval     1. Raising left arm  0    2. Putting on and off shirt 1    3.     4.    5.    Score 0.5/10    Total score = sum of the activity scores/number of activities Minimum detectable change (90%CI) for average score = 2 points Minimum detectable change (90%CI) for single activity score = 3 points     EDEMA:  Yes: moderate edema noted in left upper extremity from shoulder to hand  Observation:  2 incisions noted, one at proximal chest, one at axilla, both incisions closed with stitches and appear closed without any drainage or signs of infection at the moment. Dressing was removed and changed for him at eval.     UPPER EXTREMITY ROM:  AROM/Passive ROM Left eval Left 04/13/24  Shoulder flexion /50 /120  Shoulder extension    Shoulder abduction /50 /100  Shoulder adduction    Shoulder extension    Shoulder internal rotation /50   Shoulder external rotation /  40 /70  Elbow flexion    Elbow extension    Wrist flexion    Wrist extension    Wrist ulnar deviation    Wrist radial deviation    Wrist pronation    Wrist supination     (Blank rows = not tested)   UPPER EXTREMITY  MMT:  MMT Left eval   Shoulder flexion 1   Shoulder extension    Shoulder abduction 1   Shoulder adduction    Shoulder extension    Shoulder internal rotation 1   Shoulder external rotation 1   Middle trapezius    Lower trapezius    Elbow flexion 1   Elbow extension 2   Wrist flexion    Wrist extension    Wrist ulnar deviation    Wrist radial deviation    Wrist pronation    Wrist supination    Grip strength     (Blank rows = not tested)                                                                                                                              TREATMENT DATE:  04/18/24 Therex: Standing p ball roll outs on mat table for AAROM flexion, abduction, ER, X 15 reps each Standing elbow AROM X 15 reps Standing posterior shoulders circles  including scapular retraction X 15 Standing pendulums X 15 circles CW, CCW, flexion-extension, and abduction-adduction Supine left elbow AROM X 10 reps Supine chest press AAROM on left X 15 reps with hands clasped Supine AAROM with hands clasped X 10 reps flexion Gripping X 20  Manual therapy: Left shoulder PROM in all planes   PATIENT EDUCATION: Education details: HEP, PT plan of care, selfcare Person educated: Patient Education method: Explanation, Demonstration, Verbal cues, and Handouts Education comprehension: verbalized understanding, further education recommended   HOME EXERCISE PROGRAM: Access Code: [email or web address] URL: [email or web address] Date: 03/31/2024 Prepared by: Redell Moose  Exercises - Circular Shoulder Pendulum with Table Support  - 2-3 x daily - 7 x weekly - 1 sets - 15 reps - Seated Shoulder Abduction Towel Slide at Table Top  - 2-3 x daily - 6 x weekly - 1 sets - 10 reps - 5 sec hold - Seated Shoulder Flexion Towel Slide at Table Top  - 2-3 x daily - 6 x weekly - 1 sets - 10 reps - 5 sec hold - Seated Shoulder External Rotation PROM on Table  - 2-3 x daily - 6 x weekly - 1 sets - 10 reps -  5 sec hold - Seated Gripping Towel  - 2-3 x daily - 6 x weekly - 1 sets - 20 reps - Standing Elbow Flexion Extension AROM  - 2-3 x daily - 6 x weekly - 1 sets - 10 reps  ASSESSMENT:  CLINICAL IMPRESSION: The rash around his incisions are looking better and he will have wound check later today. He still has a lot of difficulty with arm  strength and grip strength. I continue to recommend he see orthopedic to get his wrist looked at as he has lots of swelling, pain, and cannot make a fist with that hand. His ROM is improving well but strength remains very limited.    OBJECTIVE IMPAIRMENTS: decreased activity tolerance for ADL's, difficulty walking, decreased balance, decreased endurance, decreased mobility, decreased ROM, decreased strength, impaired flexibility, impaired UE use, and pain.  ACTIVITY LIMITATIONS: bending, lifting, carry, reaching, locomotion, cleaning, community activity, driving,  PERSONAL FACTORS: see above PMH  also affecting patient's functional outcome.  REHAB POTENTIAL: Good  CLINICAL DECISION MAKING: Evolving/moderate complexity  EVALUATION COMPLEXITY: Moderate    GOALS: Short term PT Goals Target date: 04/28/2024   Pt will be I and compliant with HEP. Baseline:  Goal status: MET 04/18/24 Pt will decrease pain by 25% overall Baseline: Goal status: MET 04/18/24  Long term PT goals Target date:06/23/2024   Pt will improve right shoulder and elbow AROM to [facility] to improve functional mobility Baseline: Goal status: ongoing 04/18/24 Pt will improve  right arm strength to at least 4+/5 MMT to improve functional strength Baseline: Goal status: ongoing 04/18/24 Pt will improve Patient specific functional scale (PSFS) to at least 7/10 to show improved function level Baseline: Goal status: ongoing 04/18/24 Pt will reduce pain to overall less than 3/10 with usual activity Baseline: Goal status: ongoing 04/18/24 PLAN: PT FREQUENCY: 1-3 times per week   PT DURATION:  6-8 weeks  PLANNED INTERVENTIONS (unless contraindicated):  97110-Therapeutic exercises, 97530- Therapeutic activity, 97112- Neuromuscular re-education, 97535- Self Care, 02859- Manual therapy, G0283- Electrical stimulation (unattended), 97016- Vasopneumatic device, [id number]- Ultrasound, and 97033- Ionotophoresis 4mg /ml Dexamethasone   PLAN FOR NEXT SESSION: AAROM and PROM as tolerated, grip strength and arm strength has been difficult.    Redell JONELLE Moose, PT,DPT 04/18/2024, 8:35 AM

## 2024-04-18 NOTE — Progress Notes (Signed)
 Patient name: Randy Hebert MRN: [id number] DOB: 1966/04/28 Sex: male  REASON FOR VISIT:    Postop check  HISTORY OF PRESENT ILLNESS:   Randy Hebert is a 58 y.o. male who fell and presented to the emergency department with a left shoulder dislocation.  Presented with a shoulder dislocation and subsequent pseudoaneurysm of the left axillary artery with active extravasation on 03/12/2024.  He was taken the operating room for repair.  He had a recurrence of his pseudoaneurysm which was subsequently stented by Dr. Magda.  The wound grew out Enterococcus with rare Streptococcus.  ID was involved for antibiotic choices.  The plan was for 6 weeks of Augmentin .    CURRENT MEDICATIONS:    Current Outpatient Medications  Medication Sig Dispense Refill   acetaminophen  (TYLENOL ) 500 MG tablet Take 2 tablets (1,000 mg total) by mouth every 8 (eight) hours as needed for mild pain or fever.     amLODipine  (NORVASC ) 10 MG tablet Take 1 tablet (10 mg total) by mouth daily. 90 tablet 1   amoxicillin -clavulanate (AUGMENTIN ) 875-125 MG tablet Take 1 tablet by mouth every 12 (twelve) hours. 82 tablet 0   Ascorbic Acid  (VITAMIN C  PO) Take 1 tablet by mouth daily.     aspirin  EC 81 MG tablet Take 1 tablet (81 mg total) by mouth daily. Swallow whole. 30 tablet 12   atorvastatin  (LIPITOR ) 80 MG tablet Take 1 tablet (80 mg total) by mouth daily. 30 tablet 2   Cholecalciferol  (VITAMIN D -3 PO) Take 1 capsule by mouth daily.     citalopram  (CELEXA ) 20 MG tablet Take 1 tablet (20 mg total) by mouth daily. 90 tablet 1   clopidogrel  (PLAVIX ) 75 MG tablet Take 1 tablet (75 mg total) by mouth daily. 30 tablet 0   dapagliflozin  propanediol (FARXIGA ) 10 MG TABS tablet Take 1 tablet (10 mg total) by mouth daily. 30 tablet 0   Dulaglutide  (TRULICITY ) 1.5 MG/0.5ML SOAJ Inject 1.5 mg into the skin once a week. 6 mL 0   ezetimibe  (ZETIA ) 10 MG tablet Take 1 tablet (10 mg total) by mouth  daily. 90 tablet 1   Ferrous Sulfate  (IRON PO) Take 1 tablet by mouth daily.     fluconazole  (DIFLUCAN ) 150 MG tablet Take 1 tablet (150 mg total) by mouth daily. 5 tablet 0   furosemide  (LASIX ) 40 MG tablet Take 40 mg by mouth daily as needed (excessive swelling).     MAGNESIUM  PO Take 1 tablet by mouth 2 (two) times daily.     metFORMIN  (GLUCOPHAGE ) 500 MG tablet Take 1 tablet (500 mg total) by mouth 2 (two) times daily with a meal. 60 tablet 0   naproxen  (NAPROSYN ) 375 MG tablet Take 1 tablet (375 mg total) by mouth 2 (two) times daily as needed for moderate pain (pain score 4-6). 20 tablet 0   nitroGLYCERIN  (NITROSTAT ) 0.4 MG SL tablet Place 1 tablet (0.4 mg total) under the tongue every 5 (five) minutes x 3 doses as needed (if no relief after 3rd dose, proceed to ED or call 911). 25 tablet 3   oxyCODONE  (OXY IR/ROXICODONE ) 5 MG immediate release tablet Take 1 tablet (5 mg total) by mouth every 8 (eight) hours as needed for severe pain (pain score 7-10). 15 tablet 0   oxyCODONE -acetaminophen  (PERCOCET/ROXICET) 5-325 MG tablet Take 1 tablet by mouth every 4 (four) hours as needed for severe pain (pain score 7-10). 15 tablet 0   pantoprazole  (PROTONIX ) 40 MG tablet TAKE (1) TABLET BY  MOUTH TWICE DAILY BEFORE A MEAL. 180 tablet 1   RESTASIS  0.05 % ophthalmic emulsion Place 1 drop into both eyes 2 (two) times daily.     SYMBICORT  160-4.5 MCG/ACT inhaler INHALE 2 PUFFS INTO THE LUNGS FIRST THING IN THE MORNING AND 2 PUFFS ABOUT 12 HOURS LATER. 10.2 g 12   No current facility-administered medications for this visit.    REVIEW OF SYSTEMS:   [X]  denotes positive finding, [ ]  denotes negative finding Cardiac  Comments:  Chest pain or chest pressure:    Shortness of breath upon exertion:    Short of breath when lying flat:    Irregular heart rhythm:    Constitutional    Fever or chills:      PHYSICAL EXAM:   Vitals:   04/18/24 1440  BP: 133/79  Pulse: 94  Resp: 18  Temp: 98 F (36.7 C)   TempSrc: Temporal  SpO2: 95%  Weight: 178 lb (80.7 kg)  Height: 5' 5 (1.651 m)    GENERAL: The patient is a well-nourished male, in no acute distress. The vital signs are documented above. CARDIOVASCULAR: There is a regular rate and rhythm. PULMONARY: Non-labored respirations Sutures were removed from the wound which still has some erythema associated with it.  STUDIES:      MEDICAL ISSUES:   Status post pseudoaneurysm repair of left axillary artery which has been secondarily infected.  Infectious disease was involved for his polymicrobial infection.  We placed him on Diflucan  for a yeast infection which was topical.  This appears to be improved.  The remaining sutures and staples were removed today.  He continues to participate with physical therapy but still has limitations in his shoulder.  I am making sure orthopedics sees him in the immediate future to make sure there is nothing wrong with the joint.  The numbness he experiences in his forearm could be related to nerve compression at the time of the pseudoaneurysm and hopefully will improve over time.  He is going to return in 2 weeks for a wound check  Malvina New, IV, MD, FACS [facility] [phone] Pager [phone]

## 2024-04-19 ENCOUNTER — Ambulatory Visit (INDEPENDENT_AMBULATORY_CARE_PROVIDER_SITE_OTHER): Admitting: Physician Assistant

## 2024-04-19 ENCOUNTER — Ambulatory Visit (INDEPENDENT_AMBULATORY_CARE_PROVIDER_SITE_OTHER)

## 2024-04-19 ENCOUNTER — Encounter (HOSPITAL_BASED_OUTPATIENT_CLINIC_OR_DEPARTMENT_OTHER): Payer: Self-pay | Admitting: Physician Assistant

## 2024-04-19 ENCOUNTER — Ambulatory Visit (HOSPITAL_BASED_OUTPATIENT_CLINIC_OR_DEPARTMENT_OTHER)

## 2024-04-19 DIAGNOSIS — M79602 Pain in left arm: Secondary | ICD-10-CM

## 2024-04-19 NOTE — Progress Notes (Signed)
 Office Visit Note   Patient: Randy Hebert           Date of Birth: [birth year]           MRN: [id number] Visit Date: 04/19/2024              Requested by: Cook, Jayce G, DO [address] [place],  [place] [address] PCP: Cook, Jayce G, DO   Assessment & Plan: Visit Diagnoses:  1. Pain of left upper extremity     Plan: Patient is a pleasant 58 year old gentleman with a complicated history of his left upper extremity.  In early July he had a hard fall onto his left shoulder.  He was seen and evaluated at [facility].  X-rays there demonstrated an anterior inferior left shoulder dislocation.  Per the patient this was reduced with sedation.  Unfortunately he sustained a arterial injury to the axillary artery.  He subsequently went with vascular and underwent repair.  He then developed a hematoma which was evacuated and stents were placed in his different procedure by Dr. Magda.  He also has been on antibiotics because of positive cultures.  He is referred today because he has continued swelling in his forearm and his wrist.  He has no fractures noted.  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  He has been working with physical therapy.  I will discuss his case with Dr. Genelle tomorrow she has had recent evaluation by vascular with an ultrasound and he is got a strong radial pulse today.  I did provide him with a sling to help stabilize his shoulder told him to keep moving his fingers and elevation is much as possible.  This was not any kind of new traumatic event and  Follow-Up Instructions: Return if symptoms worsen or fail to improve.   Orders:  Orders Placed This Encounter  Procedures   DG Forearm Left   DG Hand Complete Left   DG Shoulder Left   No orders of the defined types were placed in this encounter.     Procedures: No procedures  performed   Clinical Data: No additional findings.   Subjective: Chief Complaint  Patient presents with   Left Shoulder - Pain    HPI Patient is a pleasant 58 year old gentleman who has been followed by vascular surgery.  He is status post anterior inferior left shoulder dislocation on July 11.  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  He then subsequently a couple weeks later had a stent placement and evacuation of a hematoma.  He saw vascular yesterday.  They were concerned with regards to further injury to his arm Review of Systems  All other systems reviewed and are negative.    Objective: Vital Signs: There were no vitals taken for this visit.  Physical Exam Constitutional:      Appearance: Normal appearance.  Pulmonary:     Effort: Pulmonary effort is normal.  Skin:    General: Skin is warm and dry.  Neurological:     Mental Status: He is alert.  Psychiatric:        Mood and Affect: Mood normal.        Behavior: Behavior normal.     Ortho Exam Examination of his left arm he has swelling in his forearm into his hand.  He  has very little wrist extension.  He has difficulty with extension flexion of his elbow and abduction.  His compartments are soft he does have a palpable radial pulse.  He has decreased sensation.  He has obvious shoulder deformity consistent with a shoulder sag. Specialty Comments:  No specialty comments available.  Imaging: No results found.   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   Axillary artery injury 03/12/2024   Hypokalemia 03/03/2024   Acute CVA (cerebrovascular accident) (HCC) 03/02/2024   Essential hypertension 03/02/2024   Dyslipidemia 03/02/2024   Type 2 diabetes mellitus with hyperlipidemia (HCC)  03/02/2024   Depression 03/02/2024   Acute right-sided low back pain with right-sided sciatica 11/30/2023   Anxiety 04/23/2023   Erectile dysfunction 02/24/2023   Chronic eczematous otitis externa of both ears 02/05/2023   Tobacco abuse 12/25/2022   Malignant neoplasm of upper lobe of right lung (HCC) 09/25/2022   Cervical spondylosis with myelopathy 08/01/2022   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   Vocal cord dysfunction 05/21/2022   DDD (degenerative disc disease), cervical 05/21/2022   Osteoarthritis of both hands 05/19/2022   History of GI bleed 11/13/2021   Insomnia 09/13/2021   Cirrhosis of liver (HCC) 11/08/2018   Barrett's esophagus 08/04/2018   Vitamin D  deficiency 06/30/2018   Alcohol  abuse 06/30/2018   DM type 2 causing vascular disease (HCC) 08/17/2017   Chronic combined systolic and diastolic CHF (congestive heart failure) (HCC) 05/15/2017   OSA (obstructive sleep apnea) 04/28/2017   Depression with anxiety 04/15/2017   GERD (gastroesophageal reflux disease) 09/23/2016   Asthmatic bronchitis , chronic (HCC) 08/27/2015   Mixed hyperlipidemia 08/27/2015   Class 1 obesity due to excess calories with serious comorbidity and body mass index (BMI) of 32.0 to 32.9 in adult 08/27/2015   CAD (coronary artery disease) 05/31/2015   Past Medical History:  Diagnosis Date   ACE inhibitor-aggravated angioedema    Allergy     Anemia    Angio-edema    Anxiety    Arthritis    Asthma    hip replacement   Back pain    Bradycardia 04/28/2017   Bulging of cervical intervertebral disc    CAD (coronary artery disease)    lateral STEMI 02/06/2015 00% D1 occlusion treated with Promus Premier 2.5 mm x 16 mm DES, 70% ramus stenosis, 40% mid RCA stenosis, 45% distal RCA stenosis, EF 45-50%   CHF (congestive heart failure) (HCC)    COPD (chronic obstructive pulmonary disease) (HCC)    Depression    Diabetes mellitus without complication (HCC)    Difficult intubation    Possible secondary to  vocal cord injury per patient   Dry eye    Dyspnea    Early satiety 09/23/2016   Fatty liver    GERD (gastroesophageal reflux disease)    HCAP (healthcare-associated pneumonia) 05/15/2017   Headache    Heart murmur    Hip pain    Hyperlipidemia    Hypertension    Lobar pneumonia (HCC) 05/15/2017   Melena 08/04/2018   MI (myocardial infarction) [facility])    Myocardial infarction (HCC)    Neck pain    Non-ST elevation (NSTEMI) myocardial infarction (HCC) 04/27/2017   NSTEMI (non-ST elevated myocardial infarction) (HCC) 04/26/2017   Otitis media    Pleurisy    Pneumonia due to COVID-19 virus 10/07/2019  Rectal bleeding 11/08/2018   Right shoulder pain 03/20/2020   Sinus pause    9 sec sinus pause on telemetry after started on coreg  after MI, avoid AV nodal blocking agent   Sleep apnea    uses a cpap   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   STEMI (ST elevation myocardial infarction) (HCC) 05/31/2015   Substance abuse (HCC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   Syncope 06/29/2017   Syncope and collapse 05/15/2017   Transaminitis 08/04/2018   Unilateral primary osteoarthritis, right hip 07/01/2016    Family History  Problem Relation Age of Onset   Heart attack Father    Stroke Father    Arthritis Father    Heart disease Father    Cancer Mother        ???   Arthritis Mother    Heart disease Brother 4       died in sleep   Early death Brother    Diabetes Maternal Uncle    Alzheimer's disease Maternal Grandmother     Past Surgical History:  Procedure Laterality Date   ARTERY EXPLORATION Left 03/24/2024   Procedure: LEFT ARCH AORTOGRAM, LEFT UPPER EXTREMITY ANGIOGRAM WITH FIRST ORDER CANNULATION, LEFT AXILLARY ANGIOPLASTY AND STENTING, EVACUATION OF LEFT AXILLARY HEMATOMA;  Surgeon: Magda Debby SAILOR, MD;  Location: [facility] OR;  Service: Vascular;  Laterality: Left;   ARTERY REPAIR Left 03/11/2024   Procedure: REPAIR LEFT AXILLARY ARTERY;  Surgeon: Serene Gaile ORN, MD;  Location: [facility]  OR;  Service: Vascular;  Laterality: Left;  ARTERIAL REPAIR, LEFT AXILLARY   BIOPSY  10/09/2016   Procedure: BIOPSY;  Surgeon: Lamar CHRISTELLA Hollingshead, MD;  Location: [facility] ENDO SUITE;  Service: Endoscopy;;   BIOPSY  11/28/2019   Procedure: BIOPSY;  Surgeon: Hollingshead Lamar CHRISTELLA, MD;  Location: [facility];  Service: Endoscopy;;   BIOPSY  06/15/2023   Procedure: BIOPSY;  Surgeon: Hollingshead Lamar CHRISTELLA, MD;  Location: [facility];  Service: Endoscopy;;   BRONCHIAL BIOPSY  10/16/2022   Procedure: BRONCHIAL BIOPSIES;  Surgeon: Gladis Leonor CHRISTELLA, MD;  Location: [facility] ENDOSCOPY;  Service: Pulmonary;;   BRONCHIAL NEEDLE ASPIRATION BIOPSY  10/16/2022   Procedure: BRONCHIAL NEEDLE ASPIRATION BIOPSIES;  Surgeon: Gladis Leonor CHRISTELLA, MD;  Location[facility] ENDOSCOPY;  Service: Pulmonary;;   BRONCHIAL WASHINGS  10/16/2022   Procedure: BRONCHIAL WASHINGS;  Surgeon: Gladis Leonor CHRISTELLA, MD;  Location: [facility] ENDOSCOPY;  Service: Pulmonary;;   CARDIAC CATHETERIZATION N/A 02/06/2015   Procedure: Left Heart Cath and Coronary Angiography;  Surgeon: Alm ORN Clay, MD;  Location: [facility] INVASIVE CV LAB;  Service: Cardiovascular;  Laterality: N/A;   CARDIAC CATHETERIZATION N/A 02/06/2015   Procedure: Coronary Stent Intervention;  Surgeon: Alm ORN Clay, MD;  Location: [facility] INVASIVE CV LAB;  Service: Cardiovascular;  Laterality: N/A;   COLONOSCOPY WITH PROPOFOL  N/A 10/09/2016   Sigmoid and descending colon diverticulosis, four 4-6 mm polyps in sigmoid, one 4 mm polyp in descending. Tubular adenomas and hyperplastic. 5 year surveillance.    COLONOSCOPY WITH PROPOFOL  N/A 11/24/2016   Sigmoid and descending colon diverticulosis, four 4-6 mm polyps in sigmoid, one 4 mm polyp in descending. Tubular adenomas and hyperplastic. 5 year surveillance.    COLONOSCOPY WITH PROPOFOL  N/A 10/18/2021   Carver: nonbleeding internal hemorrhoids, small and large mouth diverticula found in the sigmoid, descending, transverse colon.  A 9 mm sessile polyp was found in the  ascending colon that was removed.  4 sessile polyps found in the sigmoid, descending, transverse colon 3 to 5 mm  in size, examining otherwise.  Path revealed tubular adenomas.  Repeat due in 5 years for surveillance.   CORONARY ANGIOPLASTY WITH STENT PLACEMENT  01/2015   CORONARY STENT INTERVENTION N/A 04/27/2017   Procedure: CORONARY STENT INTERVENTION;  Surgeon: Mady Bruckner, MD;  Location: [facility] INVASIVE CV LAB;  Service: Cardiovascular;  Laterality: N/A;   ELECTROPHYSIOLOGY STUDY N/A 06/29/2017   Procedure: ELECTROPHYSIOLOGY STUDY;  Surgeon: Waddell Danelle ORN, MD;  Location: [facility] INVASIVE CV LAB;  Service: Cardiovascular;  Laterality: N/A;   ESOPHAGOGASTRODUODENOSCOPY (EGD) WITH PROPOFOL  N/A 10/09/2016   Dr. Shaaron: LA grade a esophagitis.  Barrett's esophagus, biopsy-proven.  Small hiatal hernia.  EGD February 2019   ESOPHAGOGASTRODUODENOSCOPY (EGD) WITH PROPOFOL  N/A 11/28/2019    salmon-colored esophageal mucosa (Barrett's) small hiatal hernia, portal hypertensive gastropathy, normal duodenum, 3 year surveillance   ESOPHAGOGASTRODUODENOSCOPY (EGD) WITH PROPOFOL  N/A 09/06/2021   three columns of grade 1 varices in distal esophagus, no stigmata of bleeding or red wale signs. Small hiatal hernia. Mild portal gastropathy. Normal duodenum. Repeat upper endoscopy in 1 year for surveillance.   ESOPHAGOGASTRODUODENOSCOPY (EGD) WITH PROPOFOL  N/A 06/15/2023   Procedure: ESOPHAGOGASTRODUODENOSCOPY (EGD) WITH PROPOFOL ;  Surgeon: Shaaron Lamar HERO, MD;  Location: [facility];  Service: Endoscopy;  Laterality: N/A;  830am, asa 3   FIDUCIAL MARKER PLACEMENT  10/16/2022   Procedure: FIDUCIAL MARKER PLACEMENT;  Surgeon: Gladis Leonor HERO, MD;  Location: [facility] ENDOSCOPY;  Service: Pulmonary;;   INCISION / DRAINAGE HAND / FINGER     LEFT HEART CATH AND CORONARY ANGIOGRAPHY N/A 04/27/2017   Procedure: LEFT HEART CATH AND CORONARY ANGIOGRAPHY;  Surgeon: Mady Bruckner, MD;  Location: [facility] INVASIVE CV LAB;  Service:  Cardiovascular;  Laterality: N/A;   LOOP RECORDER INSERTION  06/29/2017   Procedure: Loop Recorder Insertion;  Surgeon: Waddell Danelle ORN, MD;  Location: [facility] INVASIVE CV LAB;  Service: Cardiovascular;;   POLYPECTOMY  11/24/2016   Procedure: POLYPECTOMY;  Surgeon: Lamar HERO Shaaron, MD;  [facility];  Service: Endoscopy;;  descending and sigmoid   POLYPECTOMY  10/18/2021   Procedure: POLYPECTOMY;  Surgeon: Cindie Carlin POUR, DO;  Location: [facility];  Service: Endoscopy;;   TOTAL HIP ARTHROPLASTY Right 07/01/2016   TOTAL HIP ARTHROPLASTY Right 07/01/2016   Procedure: RIGHT TOTAL HIP ARTHROPLASTY ANTERIOR APPROACH;  Surgeon: Bruckner CINDERELLA Poli, MD;  Location: [facility] OR;  Service: Orthopedics;  Laterality: Right;   ULTRASOUND GUIDANCE FOR VASCULAR ACCESS Left 03/24/2024   Procedure: ULTRASOUND GUIDANCE, FOR VASCULAR ACCESS;  Surgeon: Magda Debby SAILOR, MD;  Location: [facility] OR;  Service: Vascular;  Laterality: Left;   Social History   Occupational History   Occupation: [employer]  Tobacco Use   Smoking status: Every Day    Current packs/day: 1.00    Average packs/day: 1 pack/day for 46.0 years (46.0 ttl pk-yrs)    Types: Cigarettes    Passive exposure: Never   Smokeless tobacco: Former    Types: Chew   Tobacco comments:    Smokes 1 pack a day of cigarettes (20 cig)10/16/23  Vaping Use   Vaping status: Former  Substance and Sexual Activity   Alcohol  use: Yes    Comment: rare beer use   Drug use: No    Comment: history of drug use- marijuana, cocaine- quit 2014   Sexual activity: Yes    Partners: Female    Birth control/protection: None

## 2024-04-20 ENCOUNTER — Encounter: Payer: Self-pay | Admitting: Physical Therapy

## 2024-04-20 ENCOUNTER — Ambulatory Visit: Admitting: Physical Therapy

## 2024-04-20 ENCOUNTER — Ambulatory Visit: Payer: Self-pay

## 2024-04-20 DIAGNOSIS — M6281 Muscle weakness (generalized): Secondary | ICD-10-CM

## 2024-04-20 DIAGNOSIS — M25512 Pain in left shoulder: Secondary | ICD-10-CM | POA: Diagnosis not present

## 2024-04-20 NOTE — Telephone Encounter (Signed)
 Copied from CRM #[id number]. Topic: Clinical - Red Word Triage >> Apr 20, 2024 11:57 AM Kevelyn M wrote: Red Word that prompted transfer to Nurse Triage: Patient is experiencing left shoulder pain. Dislocated and has had 2 surgeries. He is now off of pain meds but he can't get sleep because he's in so much pain at night. He is asking for Trazodone . He took his last 2 Trazodone  as well. He's asking for an appointment as well. Reason for Disposition  Shoulder pain is a chronic symptom (recurrent or ongoing AND present > 4 weeks)  Answer Assessment - Initial Assessment Questions 1. ONSET: When did the pain start?     Early July after a fall  2. LOCATION: Where is the pain located?     Left shoulder  3. PAIN: How bad is the pain? (Scale 1-10; or mild, moderate, severe)     Moderate to severe pain  4. WORK OR EXERCISE: Has there been any recent work or exercise that involved this part of the body?     No  5. CAUSE: What do you think is causing the shoulder pain?     Related to fall in July  6. OTHER SYMPTOMS: Do you have any other symptoms? (e.g., neck pain, swelling, rash, fever, numbness, weakness)     *No Answer*   Seen by ortho yesterday, found fractured collar bone and nerve damage. reported at that time. Was taking Trazadone and percocet. Has now ran out of trazadone and would like to discuss getting restarted on it  Protocols used: Shoulder Pain-A-AH

## 2024-04-20 NOTE — Therapy (Signed)
 OUTPATIENT PHYSICAL THERAPY TREATMENT   Patient Name: Randy Hebert MRN: [id number] DOB:09/29/1965, 58 y.o., male Today's Date: 04/20/2024  END OF SESSION:  PT End of Session - 04/20/24 0847     Visit Number 4    Number of Visits 20    Date for PT Re-Evaluation 06/23/24    Progress Note Due on Visit 10    PT Start Time 0755    PT Stop Time 0835    PT Time Calculation (min) 40 min    Activity Tolerance Patient tolerated treatment well    Behavior During Therapy [facility] for tasks assessed/performed           Past Medical History:  Diagnosis Date   ACE inhibitor-aggravated angioedema    Allergy     Anemia    Angio-edema    Anxiety    Arthritis    Asthma    hip replacement   Back pain    Bradycardia 04/28/2017   Bulging of cervical intervertebral disc    CAD (coronary artery disease)    lateral STEMI 02/06/2015 00% D1 occlusion treated with Promus Premier 2.5 mm x 16 mm DES, 70% ramus stenosis, 40% mid RCA stenosis, 45% distal RCA stenosis, EF 45-50%   CHF (congestive heart failure) (HCC)    COPD (chronic obstructive pulmonary disease) (HCC)    Depression    Diabetes mellitus without complication (HCC)    Difficult intubation    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   Dry eye    Dyspnea    Early satiety 09/23/2016   Fatty liver    GERD (gastroesophageal reflux disease)    HCAP (healthcare-associated pneumonia) 05/15/2017   Headache    Heart murmur    Hip pain    Hyperlipidemia    Hypertension    Lobar pneumonia (HCC) 05/15/2017   Melena 08/04/2018   MI (myocardial infarction) (HCC)    Myocardial infarction (HCC)    Neck pain    Non-ST elevation (NSTEMI) myocardial infarction (HCC) 04/27/2017   NSTEMI (non-ST elevated myocardial infarction) (HCC) 04/26/2017   Otitis media    Pleurisy    Pneumonia due to COVID-19 virus 10/07/2019   Rectal bleeding 11/08/2018   Right shoulder pain 03/20/2020   Sinus pause    9 sec sinus pause on telemetry after  started on coreg  after MI, avoid AV nodal blocking agent   Sleep apnea    uses a cpap   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   STEMI (ST elevation myocardial infarction) (HCC) 05/31/2015   Substance abuse (HCC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   Syncope 06/29/2017   Syncope and collapse 05/15/2017   Transaminitis 08/04/2018   Unilateral primary osteoarthritis, right hip 07/01/2016   Past Surgical History:  Procedure Laterality Date   ARTERY EXPLORATION Left 03/24/2024   Procedure: LEFT ARCH AORTOGRAM, LEFT UPPER EXTREMITY ANGIOGRAM WITH FIRST ORDER CANNULATION, LEFT AXILLARY ANGIOPLASTY AND STENTING, EVACUATION OF LEFT AXILLARY HEMATOMA;  Surgeon: Magda Debby SAILOR, MD;  Location: [facility] OR;  Service: Vascular;  Laterality: Left;   ARTERY REPAIR Left 03/11/2024   Procedure: REPAIR LEFT AXILLARY ARTERY;  Surgeon: Serene Gaile ORN, MD;  Location: [facility] OR;  Service: Vascular;  Laterality: Left;  ARTERIAL REPAIR, LEFT AXILLARY   BIOPSY  10/09/2016   Procedure: BIOPSY;  Surgeon: Lamar CHRISTELLA Hollingshead, MD;  Location: [facility] ENDO SUITE;  Service: Endoscopy;;   BIOPSY  11/28/2019   Procedure: BIOPSY;  Surgeon: Hollingshead Lamar CHRISTELLA, MD;  Location: [facility];  Service: Endoscopy;;   BIOPSY  06/15/2023   Procedure: BIOPSY;  Surgeon: Shaaron Lamar HERO, MD;  [facility]: [facility];  Service: Endoscopy;;   BRONCHIAL BIOPSY  10/16/2022   Procedure: BRONCHIAL BIOPSIES;  Surgeon: Gladis Leonor HERO, MD;  Location: [facility] ENDOSCOPY;  Service: Pulmonary;;   BRONCHIAL NEEDLE ASPIRATION BIOPSY  10/16/2022   Procedure: BRONCHIAL NEEDLE ASPIRATION BIOPSIES;  Surgeon: Gladis Leonor HERO, MD;  Location: [facility] ENDOSCOPY;  Service: Pulmonary;;   BRONCHIAL WASHINGS  10/16/2022   Procedure: BRONCHIAL WASHINGS;  Surgeon: Gladis Leonor HERO, MD;  Location: [facility] ENDOSCOPY;  Service: Pulmonary;;   CARDIAC CATHETERIZATION N/A 02/06/2015   Procedure: Left Heart Cath and Coronary Angiography;  Surgeon: Alm LELON Clay, MD;  Location: [facility] INVASIVE CV LAB;   Service: Cardiovascular;  Laterality: N/A;   CARDIAC CATHETERIZATION N/A 02/06/2015   Procedure: Coronary Stent Intervention;  Surgeon: Alm LELON Clay, MD;  Location: [facility] INVASIVE CV LAB;  Service: Cardiovascular;  Laterality: N/A;   COLONOSCOPY WITH PROPOFOL  N/A 10/09/2016   Sigmoid and descending colon diverticulosis, four 4-6 mm polyps in sigmoid, one 4 mm polyp in descending. Tubular adenomas and hyperplastic. 5 year surveillance.    COLONOSCOPY WITH PROPOFOL  N/A 11/24/2016   Sigmoid and descending colon diverticulosis, four 4-6 mm polyps in sigmoid, one 4 mm polyp in descending. Tubular adenomas and hyperplastic. 5 year surveillance.    COLONOSCOPY WITH PROPOFOL  N/A 10/18/2021   Carver: nonbleeding internal hemorrhoids, small and large mouth diverticula found in the sigmoid, descending, transverse colon.  A 9 mm sessile polyp was found in the ascending colon that was removed.  4 sessile polyps found in the sigmoid, descending, transverse colon 3 to 5 mm in size, examining otherwise.  Path revealed tubular adenomas.  Repeat due in 5 years for surveillance.   CORONARY ANGIOPLASTY WITH STENT PLACEMENT  01/2015   CORONARY STENT INTERVENTION N/A 04/27/2017   Procedure: CORONARY STENT INTERVENTION;  Surgeon: Mady Bruckner, MD;  Location: [facility] INVASIVE CV LAB;  Service: Cardiovascular;  Laterality: N/A;   ELECTROPHYSIOLOGY STUDY N/A 06/29/2017   Procedure: ELECTROPHYSIOLOGY STUDY;  Surgeon: Waddell Danelle LELON, MD;  Location: [facility] INVASIVE CV LAB;  Service: Cardiovascular;  Laterality: N/A;   ESOPHAGOGASTRODUODENOSCOPY (EGD) WITH PROPOFOL  N/A 10/09/2016   Dr. Shaaron: LA grade a esophagitis.  Barrett's esophagus, biopsy-proven.  Small hiatal hernia.  EGD February 2019   ESOPHAGOGASTRODUODENOSCOPY (EGD) WITH PROPOFOL  N/A 11/28/2019    salmon-colored esophageal mucosa (Barrett's) small hiatal hernia, portal hypertensive gastropathy, normal duodenum, 3 year surveillance   ESOPHAGOGASTRODUODENOSCOPY (EGD) WITH  PROPOFOL  N/A 09/06/2021   three columns of grade 1 varices in distal esophagus, no stigmata of bleeding or red wale signs. Small hiatal hernia. Mild portal gastropathy. Normal duodenum. Repeat upper endoscopy in 1 year for surveillance.   ESOPHAGOGASTRODUODENOSCOPY (EGD) WITH PROPOFOL  N/A 06/15/2023   Procedure: ESOPHAGOGASTRODUODENOSCOPY (EGD) WITH PROPOFOL ;  Surgeon: Shaaron Lamar HERO, MD;  [facility];  Service: Endoscopy;  Laterality: N/A;  830am, asa 3   FIDUCIAL MARKER PLACEMENT  10/16/2022   Procedure: FIDUCIAL MARKER PLACEMENT;  Surgeon: Gladis Leonor HERO, MD;  Location: [facility] ENDOSCOPY;  Service: Pulmonary;;   INCISION / DRAINAGE HAND / FINGER     LEFT HEART CATH AND CORONARY ANGIOGRAPHY N/A 04/27/2017   Procedure: LEFT HEART CATH AND CORONARY ANGIOGRAPHY;  Surgeon: Mady Bruckner, MD;  Location: [facility] INVASIVE CV LAB;  Service: Cardiovascular;  Laterality: N/A;   LOOP RECORDER INSERTION  06/29/2017   Procedure: Loop Recorder Insertion;  Surgeon: Waddell Danelle LELON, MD;  Location:  [facility] INVASIVE CV LAB;  Service: Cardiovascular;;   POLYPECTOMY  11/24/2016   Procedure: POLYPECTOMY;  Surgeon: Lamar CHRISTELLA Hollingshead, MD;  [facility];  Service: Endoscopy;;  descending and sigmoid   POLYPECTOMY  10/18/2021   Procedure: POLYPECTOMY;  Surgeon: Cindie Carlin POUR, DO;  Location: [facility];  Service: Endoscopy;;   TOTAL HIP ARTHROPLASTY Right 07/01/2016   TOTAL HIP ARTHROPLASTY Right 07/01/2016   Procedure: RIGHT TOTAL HIP ARTHROPLASTY ANTERIOR APPROACH;  Surgeon: Lonni CINDERELLA Poli, MD;  Location: [facility] OR;  Service: Orthopedics;  Laterality: Right;   ULTRASOUND GUIDANCE FOR VASCULAR ACCESS Left 03/24/2024   Procedure: ULTRASOUND GUIDANCE, FOR VASCULAR ACCESS;  Surgeon: Magda Debby SAILOR, MD;  Location: [facility] OR;  Service: Vascular;  Laterality: Left;   Patient Active Problem List   Diagnosis Date Noted   Acute postoperative anemia due to greater than expected blood loss 03/26/2024    Obesity, class 1 03/26/2024   Aneurysm of axillary artery (HCC) 03/24/2024   Axillary artery injury 03/12/2024   Hypokalemia 03/03/2024   Acute CVA (cerebrovascular accident) (HCC) 03/02/2024   Essential hypertension 03/02/2024   Dyslipidemia 03/02/2024   Type 2 diabetes mellitus with hyperlipidemia (HCC) 03/02/2024   Depression 03/02/2024   Acute right-sided low back pain with right-sided sciatica 11/30/2023   Anxiety 04/23/2023   Erectile dysfunction 02/24/2023   Chronic eczematous otitis externa of both ears 02/05/2023   Tobacco abuse 12/25/2022   Malignant neoplasm of upper lobe of right lung (HCC) 09/25/2022   Cervical spondylosis with myelopathy 08/01/2022   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   Vocal cord dysfunction 05/21/2022   DDD (degenerative disc disease), cervical 05/21/2022   Osteoarthritis of both hands 05/19/2022   History of GI bleed 11/13/2021   Insomnia 09/13/2021   Cirrhosis of liver (HCC) 11/08/2018   Barrett's esophagus 08/04/2018   Vitamin D  deficiency 06/30/2018   Alcohol  abuse 06/30/2018   DM type 2 causing vascular disease (HCC) 08/17/2017   Chronic combined systolic and diastolic CHF (congestive heart failure) (HCC) 05/15/2017   OSA (obstructive sleep apnea) 04/28/2017   Depression with anxiety 04/15/2017   GERD (gastroesophageal reflux disease) 09/23/2016   Asthmatic bronchitis , chronic (HCC) 08/27/2015   Mixed hyperlipidemia 08/27/2015   Class 1 obesity due to excess calories with serious comorbidity and body mass index (BMI) of 32.0 to 32.9 in adult 08/27/2015   CAD (coronary artery disease) 05/31/2015    PCP: Cook, Jayce G, DO   REFERRING PROVIDER: Noralee Elidia Sieving*   REFERRING DIAG: left axillary pseudoaneurysm   Rationale for Evaluation and Treatment:  Rehabiliation  THERAPY DIAG:  Acute pain of left shoulder  Muscle weakness (generalized)  ONSET DATE: 03/11/24   SUBJECTIVE:  SUBJECTIVE STATEMENT: He had ortho appointment and was found to not have a break in his hand. He will follow up with Dr. Vernetta in September to have his shoulder examined again. He did get sling to wear from PA.   PERTINENT HISTORY:  past medical history of diabetes mellitus type 2, hypertension, CAD, liver cirrhosis, anxiety, depression, OSA, CVA w/ left-sided weakness who had a recent hospitalization 7/11 to 03/19/24 post traumatic left shoulder dislocation, complicated with left axillary hematoma with left axillary pseudoaneurysm. On 07/12 he underwent open exposure and repair of the left axillary pseudoaneurysm and evacuation of left axillary hematoma.   PAIN:  NPRS scale: 5/10 upon arrival Pain location:left shoulder/axillary, left forearm and wrist Pain description: constant, achy, sharp Aggravating factors: any use of this left arm. Relieving factors: rest, meds   PRECAUTIONS: ,  Shoulder  RED FLAGS: None   WEIGHT BEARING RESTRICTIONS:  He is unsure so PT will keep him NWB for his left UE for now.  FALLS:  Has patient fallen in last 6 months? Yes. Number of falls 1   OCCUPATION:  disabled  PLOF:  Independent with basic ADLs  PATIENT GOALS:  Reduce pain and get his arm back to normal  OBJECTIVE:  Note: Objective measures were completed at Evaluation unless otherwise noted.  DIAGNOSTIC FINDINGS:  Chest CT IMPRESSION: 1. Enlarging left axillary hematoma with arterial contrast extravasation from the posterior wall of the left axillary artery into a 1.5 x 1.8 cm pseudoaneurysm, with additional 1.8 x 1.1 cm pocket of contrast more posteriorly within the collection. 2. Increased heterogeneous hematoma in the left subscapularis region measuring 7.3 x 4.8 cm, but no focal contrast is seen within this. 3. Emphysema. 4. Right apical  fiducial markers and post XRT changes, with treated nodule just above the fiducial markers today measuring 8 mm, previously 9 mm. 5. Aortic and coronary artery atherosclerosis.  Right aortic arch. 6. Enlarged pulmonary trunk 3.3 cm indicating arterial hypertension. 7. Cirrhosis and splenomegaly.  Prominent patent portal vein 1.7 cm. 8. Critical Value/emergent results were called by telephone at the time of interpretation on 03/24/2024 at 5:18 am to provider [facility] , who verbally acknowledged these results.  Shoulder XR IMPRESSION: No acute bony abnormality.   Postoperative changes in the left axilla.  No imaging for left elbow/wrist  PATIENT SURVEYS:  Patient-Specific Activity Scoring Scheme  0 represents "unable to perform." 10 represents "able to perform at prior level. 0 1 2 3 4 5 6 7 8 9  10 (Date and Score)   Activity Eval     1. Raising left arm  0    2. Putting on and off shirt 1    3.     4.    5.    Score 0.5/10    Total score = sum of the activity scores/number of activities Minimum detectable change (90%CI) for average score = 2 points Minimum detectable change (90%CI) for single activity score = 3 points     EDEMA:  Yes: moderate edema noted in left upper extremity from shoulder to hand  Observation:  2 incisions noted, one at proximal chest, one at axilla, both incisions closed with stitches and appear closed without any drainage or signs of infection at the moment. Dressing was removed and changed for him at eval.     UPPER EXTREMITY ROM:  AROM/Passive ROM Left eval Left 04/13/24  Shoulder flexion /50 /120  Shoulder extension    Shoulder abduction /50 /100  Shoulder adduction  Shoulder extension    Shoulder internal rotation /50   Shoulder external rotation /40 /70  Elbow flexion    Elbow extension    Wrist flexion    Wrist extension    Wrist ulnar deviation    Wrist radial deviation    Wrist pronation    Wrist supination      (Blank rows = not tested)   UPPER EXTREMITY MMT:  MMT Left eval   Shoulder flexion 1   Shoulder extension    Shoulder abduction 1   Shoulder adduction    Shoulder extension    Shoulder internal rotation 1   Shoulder external rotation 1   Middle trapezius    Lower trapezius    Elbow flexion 1   Elbow extension 2   Wrist flexion    Wrist extension    Wrist ulnar deviation    Wrist radial deviation    Wrist pronation    Wrist supination    Grip strength     (Blank rows = not tested)                                                                                                                              TREATMENT DATE:  04/18/24 Therex: Standing p ball roll outs on mat table for AAROM flexion, abduction, ER, X 15 reps each Standing elbow AROM X 15 reps Standing posterior shoulders circles  including scapular retraction X 15 Standing pendulums X 15 circles CW, CCW, flexion-extension, and abduction-adduction Seated table slide walking fingers out into flexion X 10, into IR/ER X 5 reps Supine left elbow AROM X 10 reps Supine chest press AAROM on left X 15 reps with hands clasped Supine AAROM with hands clasped X 10 reps flexion Gripping X 20 Helped him with sling set up  Manual therapy: Left shoulder PROM in all planes   PATIENT EDUCATION: Education details: HEP, PT plan of care, selfcare Person educated: Patient Education method: Explanation, Demonstration, Verbal cues, and Handouts Education comprehension: verbalized understanding, further education recommended   HOME EXERCISE PROGRAM: Access Code: [email or web address] URL: [email or web address] Date: 03/31/2024 Prepared by: Redell Moose  Exercises - Circular Shoulder Pendulum with Table Support  - 2-3 x daily - 7 x weekly - 1 sets - 15 reps - Seated Shoulder Abduction Towel Slide at Table Top  - 2-3 x daily - 6 x weekly - 1 sets - 10 reps - 5 sec hold - Seated Shoulder Flexion Towel Slide at Table Top  -  2-3 x daily - 6 x weekly - 1 sets - 10 reps - 5 sec hold - Seated Shoulder External Rotation PROM on Table  - 2-3 x daily - 6 x weekly - 1 sets - 10 reps - 5 sec hold - Seated Gripping Towel  - 2-3 x daily - 6 x weekly - 1 sets - 20 reps - Standing Elbow Flexion Extension AROM  - 2-3 x daily - 6 x weekly -  1 sets - 10 reps  ASSESSMENT:  CLINICAL IMPRESSION: He has been recommended to wear sling by PA so did help him adjust this today. PT continued to work to improve his strength and and ROM which remains limited after his injury.   OBJECTIVE IMPAIRMENTS: decreased activity tolerance for ADL's, difficulty walking, decreased balance, decreased endurance, decreased mobility, decreased ROM, decreased strength, impaired flexibility, impaired UE use, and pain.  ACTIVITY LIMITATIONS: bending, lifting, carry, reaching, locomotion, cleaning, community activity, driving,  PERSONAL FACTORS: see above PMH  also affecting patient's functional outcome.  REHAB POTENTIAL: Good  CLINICAL DECISION MAKING: Evolving/moderate complexity  EVALUATION COMPLEXITY: Moderate    GOALS: Short term PT Goals Target date: 04/28/2024   Pt will be I and compliant with HEP. Baseline:  Goal status: MET 04/18/24 Pt will decrease pain by 25% overall Baseline: Goal status: MET 04/18/24  Long term PT goals Target date:06/23/2024   Pt will improve right shoulder and elbow AROM to [facility] to improve functional mobility Baseline: Goal status: ongoing 04/18/24 Pt will improve  right arm strength to at least 4+/5 MMT to improve functional strength Baseline: Goal status: ongoing 04/18/24 Pt will improve Patient specific functional scale (PSFS) to at least 7/10 to show improved function level Baseline: Goal status: ongoing 04/18/24 Pt will reduce pain to overall less than 3/10 with usual activity Baseline: Goal status: ongoing 04/18/24 PLAN: PT FREQUENCY: 1-3 times per week   PT DURATION: 6-8 weeks  PLANNED  INTERVENTIONS (unless contraindicated):  97110-Therapeutic exercises, 97530- Therapeutic activity, 97112- Neuromuscular re-education, 97535- Self Care, 02859- Manual therapy, G0283- Electrical stimulation (unattended), 97016- Vasopneumatic device, [id number]- Ultrasound, and 97033- Ionotophoresis 4mg /ml Dexamethasone   PLAN FOR NEXT SESSION: AAROM and PROM as tolerated, grip strength and arm strength has been difficult.    Redell JONELLE Moose, PT,DPT 04/20/2024, 8:50 AM

## 2024-04-21 ENCOUNTER — Telehealth: Payer: Self-pay | Admitting: *Deleted

## 2024-04-21 ENCOUNTER — Other Ambulatory Visit (HOSPITAL_COMMUNITY): Payer: Self-pay

## 2024-04-21 ENCOUNTER — Other Ambulatory Visit: Payer: Self-pay

## 2024-04-21 ENCOUNTER — Ambulatory Visit (INDEPENDENT_AMBULATORY_CARE_PROVIDER_SITE_OTHER): Admitting: Family Medicine

## 2024-04-21 ENCOUNTER — Ambulatory Visit (INDEPENDENT_AMBULATORY_CARE_PROVIDER_SITE_OTHER): Admitting: Family

## 2024-04-21 ENCOUNTER — Other Ambulatory Visit (HOSPITAL_BASED_OUTPATIENT_CLINIC_OR_DEPARTMENT_OTHER): Payer: Self-pay

## 2024-04-21 ENCOUNTER — Other Ambulatory Visit (HOSPITAL_BASED_OUTPATIENT_CLINIC_OR_DEPARTMENT_OTHER): Payer: Self-pay | Admitting: Physician Assistant

## 2024-04-21 ENCOUNTER — Encounter: Payer: Self-pay | Admitting: Family

## 2024-04-21 VITALS — BP 112/76 | HR 87 | Temp 98.3°F | Wt 176.0 lb

## 2024-04-21 VITALS — BP 118/68 | HR 84 | Temp 98.1°F | Ht 65.0 in | Wt 175.0 lb

## 2024-04-21 DIAGNOSIS — I721 Aneurysm of artery of upper extremity: Secondary | ICD-10-CM | POA: Diagnosis not present

## 2024-04-21 DIAGNOSIS — A491 Streptococcal infection, unspecified site: Secondary | ICD-10-CM | POA: Diagnosis not present

## 2024-04-21 DIAGNOSIS — M79602 Pain in left arm: Secondary | ICD-10-CM | POA: Diagnosis not present

## 2024-04-21 DIAGNOSIS — A498 Other bacterial infections of unspecified site: Secondary | ICD-10-CM

## 2024-04-21 MED ORDER — TRAMADOL HCL 50 MG PO TABS: 50.0000 mg | ORAL_TABLET | Freq: Three times a day (TID) | ORAL | 0 refills | Status: DC | PRN

## 2024-04-21 MED ORDER — ONDANSETRON 4 MG PO TBDP: 4.0000 mg | ORAL_TABLET | Freq: Three times a day (TID) | ORAL | 1 refills | Status: DC | PRN

## 2024-04-21 NOTE — Patient Instructions (Signed)
 Nice to see you.  We will check your lab work.   Restart taking your medication with the ondansetron  about 20-30 min before.   Plan to follow up in 2 weeks or sooner if needed.   Have a great day and stay safe!

## 2024-04-21 NOTE — Patient Instructions (Signed)
 Medication as prescribed.  Take care  Dr. Adriana Simas

## 2024-04-21 NOTE — Assessment & Plan Note (Signed)
 Mr. Edgin has been off antibiotics for at least a week and possibly more secondary to feelings of nausea and malaise. Original plan was for at least 6 weeks of amoxicillin /clavulanate for Enterococcus and Streptococcus infection. Oral choices are limited and unable to use linezolid or levofloxacin  secondary to drug-drug interactions with citalopram . Following discussion will attempt to add ondansetron  prior to antibiotic administration to see if this helps with his symptoms. He has already been taking his medication with food. Discussed importance of notifying provider about any medication changes as this places him at risk for recurrent infection. If he continues to have intolerance to the amoxicillin /clavulante with ondanestron, the only other option would be IV and could consider Delbavancin/Oritavancin weekly or placement of PICC line for daptomycin. Check lab work. Reviewed signs of infection to monitor for. Continue wound care per Vascular Surgery. Will plan to follow up in 2 weeks or sooner if needed.

## 2024-04-21 NOTE — Progress Notes (Signed)
 Subjective:   Patient ID: Randy Hebert, male    DOB: Aug 28, 1966, 58 y.o.   MRN: [id number]  Chief Complaint  Patient presents with   Follow-up    Stopped--- two weeks.      HPI:  Randy Hebert is a 58 y.o. male with recent history of shoulder dislocation and left axillary pseudoaneurysm status post repair with course complicated by fall and recurrance of left axillary pseudoaneurysm s/p left axillary artery angioplasty and stenting and incision and drainage of left axillary hematoma with cultures growing Enterococcus faecalis and Streptococcus constellatus. Last seen by Dr. Overton on 03/29/24 with plan for 6 weeks of amoxicillin /clavulanate for soft tissue infection. Seen by Vascular Surgery with remaining sutures being removed. Participating with physical therapy and continued limited shoulder motion. Here today for hospitalization follow up.   Randy Hebert has continued to have post-operative pain in his left shoulder that has slightly improved since leaving the hospital. He has stopped talking the Augmentin  for an unclear amount of time secondary to it making him feel poorly and experience nausea. Believes he may have been off antibiotics for close to 2 weeks. Has not noticed any signs of new infection. Sutures were removed a few days ago and finished a course of fluconazole  for suspected fungal infection with symptoms resolved. No fevers, chills or night sweats. Has concerns about polypharmacy and taking multiple medications.    Allergies  Allergen Reactions   Coreg  [Carvedilol ] Other (See Comments)    Sinus pause on telemetry >3 seconds. Longest one 9 sec. No AV nodal agent   Zestril  [Lisinopril ] Anaphylaxis, Shortness Of Breath and Swelling    Angioedema, required intubation and mechanical ventilation   Chantix  [Varenicline ] Other (See Comments)    Nightmares Insomnia    Amoxil  [Amoxicillin ] Nausea Only      Outpatient Medications Prior to Visit  Medication Sig Dispense  Refill   acetaminophen  (TYLENOL ) 500 MG tablet Take 2 tablets (1,000 mg total) by mouth every 8 (eight) hours as needed for mild pain or fever.     amLODipine  (NORVASC ) 10 MG tablet Take 1 tablet (10 mg total) by mouth daily. 90 tablet 1   Ascorbic Acid  (VITAMIN C  PO) Take 1 tablet by mouth daily.     aspirin  EC 81 MG tablet Take 1 tablet (81 mg total) by mouth daily. Swallow whole. 30 tablet 12   atorvastatin  (LIPITOR ) 80 MG tablet Take 1 tablet (80 mg total) by mouth daily. 30 tablet 2   Cholecalciferol  (VITAMIN D -3 PO) Take 1 capsule by mouth daily.     citalopram  (CELEXA ) 20 MG tablet Take 1 tablet (20 mg total) by mouth daily. 90 tablet 1   clopidogrel  (PLAVIX ) 75 MG tablet Take 1 tablet (75 mg total) by mouth daily. 30 tablet 0   dapagliflozin  propanediol (FARXIGA ) 10 MG TABS tablet Take 1 tablet (10 mg total) by mouth daily. 30 tablet 0   Dulaglutide  (TRULICITY ) 1.5 MG/0.5ML SOAJ Inject 1.5 mg into the skin once a week. 6 mL 0   ezetimibe  (ZETIA ) 10 MG tablet Take 1 tablet (10 mg total) by mouth daily. 90 tablet 1   Ferrous Sulfate  (IRON PO) Take 1 tablet by mouth daily.     fluconazole  (DIFLUCAN ) 150 MG tablet Take 1 tablet (150 mg total) by mouth daily. 5 tablet 0   furosemide  (LASIX ) 40 MG tablet Take 40 mg by mouth daily as needed (excessive swelling).     MAGNESIUM  PO Take 1 tablet by mouth 2 (two)  times daily.     metFORMIN  (GLUCOPHAGE ) 500 MG tablet Take 1 tablet (500 mg total) by mouth 2 (two) times daily with a meal. 60 tablet 0   naproxen  (NAPROSYN ) 375 MG tablet Take 1 tablet (375 mg total) by mouth 2 (two) times daily as needed for moderate pain (pain score 4-6). 20 tablet 0   nitroGLYCERIN  (NITROSTAT ) 0.4 MG SL tablet Place 1 tablet (0.4 mg total) under the tongue every 5 (five) minutes x 3 doses as needed (if no relief after 3rd dose, proceed to ED or call 911). 25 tablet 3   oxyCODONE -acetaminophen  (PERCOCET/ROXICET) 5-325 MG tablet Take 1 tablet by mouth every 4 (four) hours  as needed for severe pain (pain score 7-10). 15 tablet 0   pantoprazole  (PROTONIX ) 40 MG tablet TAKE (1) TABLET BY MOUTH TWICE DAILY BEFORE A MEAL. 180 tablet 1   RESTASIS  0.05 % ophthalmic emulsion Place 1 drop into both eyes 2 (two) times daily.     SYMBICORT  160-4.5 MCG/ACT inhaler INHALE 2 PUFFS INTO THE LUNGS FIRST THING IN THE MORNING AND 2 PUFFS ABOUT 12 HOURS LATER. 10.2 g 12   traMADol  (ULTRAM ) 50 MG tablet Take 1-2 tablets (50-100 mg total) by mouth every 8 (eight) hours as needed. 30 tablet 0   amoxicillin -clavulanate (AUGMENTIN ) 875-125 MG tablet Take 1 tablet by mouth every 12 (twelve) hours. (Patient not taking: Reported on 04/21/2024) 82 tablet 0   No facility-administered medications prior to visit.     Past Medical History:  Diagnosis Date   ACE inhibitor-aggravated angioedema    Allergy     Anemia    Angio-edema    Anxiety    Arthritis    Asthma    hip replacement   Back pain    Bradycardia 04/28/2017   Bulging of cervical intervertebral disc    CAD (coronary artery disease)    lateral STEMI 02/06/2015 00% D1 occlusion treated with Promus Premier 2.5 mm x 16 mm DES, 70% ramus stenosis, 40% mid RCA stenosis, 45% distal RCA stenosis, EF 45-50%   CHF (congestive heart failure) (HCC)    COPD (chronic obstructive pulmonary disease) (HCC)    Depression    Diabetes mellitus without complication (HCC)    Difficult intubation    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   Dry eye    Dyspnea    Early satiety 09/23/2016   Fatty liver    GERD (gastroesophageal reflux disease)    HCAP (healthcare-associated pneumonia) 05/15/2017   Headache    Heart murmur    Hip pain    Hyperlipidemia    Hypertension    Lobar pneumonia (HCC) 05/15/2017   Melena 08/04/2018   MI (myocardial infarction) [facility])    Myocardial infarction (HCC)    Neck pain    Non-ST elevation (NSTEMI) myocardial infarction (HCC) 04/27/2017   NSTEMI (non-ST elevated myocardial infarction) (HCC)  04/26/2017   Otitis media    Pleurisy    Pneumonia due to COVID-19 virus 10/07/2019   Rectal bleeding 11/08/2018   Right shoulder pain 03/20/2020   Sinus pause    9 sec sinus pause on telemetry after started on coreg  after MI, avoid AV nodal blocking agent   Sleep apnea    uses a cpap   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   STEMI (ST elevation myocardial infarction) (HCC) 05/31/2015   Substance abuse (HCC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   Syncope 06/29/2017   Syncope and collapse 05/15/2017   Transaminitis 08/04/2018  Unilateral primary osteoarthritis, right hip 07/01/2016     Past Surgical History:  Procedure Laterality Date   ARTERY EXPLORATION Left 03/24/2024   Procedure: LEFT ARCH AORTOGRAM, LEFT UPPER EXTREMITY ANGIOGRAM WITH FIRST ORDER CANNULATION, LEFT AXILLARY ANGIOPLASTY AND STENTING, EVACUATION OF LEFT AXILLARY HEMATOMA;  Surgeon: Magda Debby SAILOR, MD;  Location: [facility] OR;  Service: Vascular;  Laterality: Left;   ARTERY REPAIR Left 03/11/2024   Procedure: REPAIR LEFT AXILLARY ARTERY;  Surgeon: Serene Gaile ORN, MD;  Location: [facility] OR;  Service: Vascular;  Laterality: Left;  ARTERIAL REPAIR, LEFT AXILLARY   BIOPSY  10/09/2016   Procedure: BIOPSY;  Surgeon: Lamar CHRISTELLA Hollingshead, MD;  Location: [facility] ENDO SUITE;  Service: Endoscopy;;   BIOPSY  11/28/2019   Procedure: BIOPSY;  Surgeon: Hollingshead Lamar CHRISTELLA, MD;  Location: [facility];  Service: Endoscopy;;   BIOPSY  06/15/2023   Procedure: BIOPSY;  Surgeon: Hollingshead Lamar CHRISTELLA, MD;  [facility]: [facility];  Service: Endoscopy;;   BRONCHIAL BIOPSY  10/16/2022   Procedure: BRONCHIAL BIOPSIES;  Surgeon: Gladis Leonor CHRISTELLA, MD;  Location: [facility] ENDOSCOPY;  Service: Pulmonary;;   BRONCHIAL NEEDLE ASPIRATION BIOPSY  10/16/2022   Procedure: BRONCHIAL NEEDLE ASPIRATION BIOPSIES;  Surgeon: Gladis Leonor CHRISTELLA, MD;  Location: [facility] ENDOSCOPY;  Service: Pulmonary;;   BRONCHIAL WASHINGS  10/16/2022   Procedure: BRONCHIAL WASHINGS;  Surgeon: Gladis Leonor CHRISTELLA, MD;   Location: [facility] ENDOSCOPY;  Service: Pulmonary;;   CARDIAC CATHETERIZATION N/A 02/06/2015   Procedure: Left Heart Cath and Coronary Angiography;  Surgeon: Alm ORN Clay, MD;  Location: [facility] INVASIVE CV LAB;  Service: Cardiovascular;  Laterality: N/A;   CARDIAC CATHETERIZATION N/A 02/06/2015   Procedure: Coronary Stent Intervention;  Surgeon: Alm ORN Clay, MD;  Location: [name] INVASIVE CV LAB;  Service: Cardiovascular;  Laterality: N/A;   COLONOSCOPY WITH PROPOFOL  N/A 10/09/2016   Sigmoid and descending colon diverticulosis, four 4-6 mm polyps in sigmoid, one 4 mm polyp in descending. Tubular adenomas and hyperplastic. 5 year surveillance.    COLONOSCOPY WITH PROPOFOL  N/A 11/24/2016   Sigmoid and descending colon diverticulosis, four 4-6 mm polyps in sigmoid, one 4 mm polyp in descending. Tubular adenomas and hyperplastic. 5 year surveillance.    COLONOSCOPY WITH PROPOFOL  N/A 10/18/2021   Carver: nonbleeding internal hemorrhoids, small and large mouth diverticula found in the sigmoid, descending, transverse colon.  A 9 mm sessile polyp was found in the ascending colon that was removed.  4 sessile polyps found in the sigmoid, descending, transverse colon 3 to 5 mm in size, examining otherwise.  Path revealed tubular adenomas.  Repeat due in 5 years for surveillance.   CORONARY ANGIOPLASTY WITH STENT PLACEMENT  01/2015   CORONARY STENT INTERVENTION N/A 04/27/2017   Procedure: CORONARY STENT INTERVENTION;  Surgeon: Mady Bruckner, MD;  Location: [facility] INVASIVE CV LAB;  Service: Cardiovascular;  Laterality: N/A;   ELECTROPHYSIOLOGY STUDY N/A 06/29/2017   Procedure: ELECTROPHYSIOLOGY STUDY;  Surgeon: Waddell Danelle ORN, MD;  Location: [facility] INVASIVE CV LAB;  Service: Cardiovascular;  Laterality: N/A;   ESOPHAGOGASTRODUODENOSCOPY (EGD) WITH PROPOFOL  N/A 10/09/2016   Dr. Hollingshead: LA grade a esophagitis.  Barrett's esophagus, biopsy-proven.  Small hiatal hernia.  EGD February 2019   ESOPHAGOGASTRODUODENOSCOPY (EGD)  WITH PROPOFOL  N/A 11/28/2019    salmon-colored esophageal mucosa (Barrett's) small hiatal hernia, portal hypertensive gastropathy, normal duodenum, 3 year surveillance   ESOPHAGOGASTRODUODENOSCOPY (EGD) WITH PROPOFOL  N/A 09/06/2021   three columns of grade 1 varices in distal esophagus, no stigmata of bleeding or red wale signs. Small hiatal hernia. Mild  portal gastropathy. Normal duodenum. Repeat upper endoscopy in 1 year for surveillance.   ESOPHAGOGASTRODUODENOSCOPY (EGD) WITH PROPOFOL  N/A 06/15/2023   Procedure: ESOPHAGOGASTRODUODENOSCOPY (EGD) WITH PROPOFOL ;  Surgeon: Shaaron Lamar HERO, MD;  [facility];  Service: Endoscopy;  Laterality: N/A;  830am, asa 3   FIDUCIAL MARKER PLACEMENT  10/16/2022   Procedure: FIDUCIAL MARKER PLACEMENT;  Surgeon: Gladis Leonor HERO, MD;  Location: [facility] ENDOSCOPY;  Service: Pulmonary;;   INCISION / DRAINAGE HAND / FINGER     LEFT HEART CATH AND CORONARY ANGIOGRAPHY N/A 04/27/2017   Procedure: LEFT HEART CATH AND CORONARY ANGIOGRAPHY;  Surgeon: Mady Bruckner, MD;  Location: [facility] INVASIVE CV LAB;  Service: Cardiovascular;  Laterality: N/A;   LOOP RECORDER INSERTION  06/29/2017   Procedure: Loop Recorder Insertion;  Surgeon: Waddell Danelle ORN, MD;  Location: [facility] INVASIVE CV LAB;  Service: Cardiovascular;;   POLYPECTOMY  11/24/2016   Procedure: POLYPECTOMY;  Surgeon: Lamar Hebert Shaaron, MD;  [facility];  Service: Endoscopy;;  descending and sigmoid   POLYPECTOMY  10/18/2021   Procedure: POLYPECTOMY;  Surgeon: Cindie Carlin POUR, DO;  [facility];  Service: Endoscopy;;   TOTAL HIP ARTHROPLASTY Right 07/01/2016   TOTAL HIP ARTHROPLASTY Right 07/01/2016   Procedure: RIGHT TOTAL HIP ARTHROPLASTY ANTERIOR APPROACH;  Surgeon: Bruckner CINDERELLA Poli, MD;  Location: [facility] OR;  Service: Orthopedics;  Laterality: Right;   ULTRASOUND GUIDANCE FOR VASCULAR ACCESS Left 03/24/2024   Procedure: ULTRASOUND GUIDANCE, FOR VASCULAR ACCESS;  Surgeon: Magda Debby SAILOR, MD;  Location: [facility] OR;  Service: Vascular;  Laterality: Left;       Review of Systems  Constitutional:  Negative for chills, diaphoresis, fatigue and fever.  Respiratory:  Negative for cough, chest tightness, shortness of breath and wheezing.   Cardiovascular:  Negative for chest pain.  Gastrointestinal:  Negative for abdominal pain, diarrhea, nausea and vomiting.  Musculoskeletal:        Left shoulder/upper extremity pain.     Objective:   BP 112/76   Pulse 87   Temp 98.3 F (36.8 C) (Oral)   Wt 176 lb (79.8 kg)   SpO2 93%   BMI 29.29 kg/m  Nursing note and vital signs reviewed.  Physical Exam Constitutional:      General: He is not in acute distress.    Appearance: He is well-developed.  Cardiovascular:     Rate and Rhythm: Normal rate and regular rhythm.     Heart sounds: Normal heart sounds.  Pulmonary:     Effort: Pulmonary effort is normal.     Breath sounds: Normal breath sounds.  Musculoskeletal:     Comments: Wounds appear well approximated No current drainage noted.   Skin:    General: Skin is warm and dry.  Neurological:     Mental Status: He is alert.         04/21/2024   11:42 AM 04/05/2024   10:06 AM 03/21/2024   12:00 PM 03/01/2024   10:58 AM 01/04/2024   11:47 AM  Depression screen PHQ 2/9  Decreased Interest 1 2 0 1 0  Down, Depressed, Hopeless 1 2 0 0 2  PHQ - 2 Score 2 4 0 1 2  Altered sleeping 1 2  1 2   Tired, decreased energy 1 2  1  0  Change in appetite 1 2  0 0  Feeling bad or failure about yourself  0 2  0 0  Trouble concentrating 1 2  0 0  Moving slowly or fidgety/restless  1 2  1  0  Suicidal thoughts 0 0  0 0  PHQ-9 Score 7 16  4 4   Difficult doing work/chores Somewhat difficult Somewhat difficult  Somewhat difficult Somewhat difficult     Assessment & Plan:    Patient Active Problem List   Diagnosis Date Noted   Acute postoperative anemia due to greater than expected blood loss 03/26/2024   Obesity, class 1  03/26/2024   Aneurysm of axillary artery (HCC) 03/24/2024   Axillary artery injury 03/12/2024   Acute CVA (cerebrovascular accident) (HCC) 03/02/2024   Essential hypertension 03/02/2024   Dyslipidemia 03/02/2024   Type 2 diabetes mellitus with hyperlipidemia (HCC) 03/02/2024   Depression 03/02/2024   Acute right-sided low back pain with right-sided sciatica 11/30/2023   Anxiety 04/23/2023   Erectile dysfunction 02/24/2023   Chronic eczematous otitis externa of both ears 02/05/2023   Tobacco abuse 12/25/2022   Malignant neoplasm of upper lobe of right lung (HCC) 09/25/2022   Cervical spondylosis with myelopathy 08/01/2022   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   DDD (degenerative disc disease), cervical 05/21/2022   Osteoarthritis of both hands 05/19/2022   History of GI bleed 11/13/2021   Insomnia 09/13/2021   Cirrhosis of liver (HCC) 11/08/2018   Barrett's esophagus 08/04/2018   Vitamin D  deficiency 06/30/2018   Alcohol  abuse 06/30/2018   DM type 2 causing vascular disease (HCC) 08/17/2017   Chronic combined systolic and diastolic CHF (congestive heart failure) (HCC) 05/15/2017   OSA (obstructive sleep apnea) 04/28/2017   Depression with anxiety 04/15/2017   GERD (gastroesophageal reflux disease) 09/23/2016   Asthmatic bronchitis , chronic (HCC) 08/27/2015   Mixed hyperlipidemia 08/27/2015   Class 1 obesity due to excess calories with serious comorbidity and body mass index (BMI) of 32.0 to 32.9 in adult 08/27/2015   CAD (coronary artery disease) 05/31/2015     Problem List Items Addressed This Visit       Cardiovascular and Mediastinum   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 Wingerter has been off antibiotics for at least a week and possibly more secondary to feelings of nausea and malaise. Original plan was for at least 6 weeks of amoxicillin /clavulanate for Enterococcus and Streptococcus infection. Oral choices are limited and unable to use linezolid or levofloxacin   secondary to drug-drug interactions with citalopram . Following discussion will attempt to add ondansetron  prior to antibiotic administration to see if this helps with his symptoms. He has already been taking his medication with food. Discussed importance of notifying provider about any medication changes as this places him at risk for recurrent infection. If he continues to have intolerance to the amoxicillin /clavulante with ondanestron, the only other option would be IV and could consider Delbavancin/Oritavancin weekly or placement of PICC line for daptomycin. Check lab work. Reviewed signs of infection to monitor for. Continue wound care per Vascular Surgery. Will plan to follow up in 2 weeks or sooner if needed.       Relevant Orders   CBC with Differential/Platelet   Sedimentation rate   C-reactive protein   Basic Metabolic Panel (BMET)   Other Visit Diagnoses       Enterococcus faecalis infection       Relevant Orders   CBC with Differential/Platelet   Sedimentation rate   C-reactive protein   Basic Metabolic Panel (BMET)     Streptococcus infection       Relevant Orders   CBC with Differential/Platelet   Sedimentation rate   C-reactive protein   Basic Metabolic  Panel (BMET)        I am having Randy Hebert. Lear start on ondansetron . I am also having him maintain his acetaminophen , nitroGLYCERIN , amLODipine , ezetimibe , Symbicort , citalopram , pantoprazole , Restasis , aspirin  EC, furosemide , Ascorbic Acid  (VITAMIN C  PO), Cholecalciferol  (VITAMIN D -3 PO), Ferrous Sulfate  (IRON PO), MAGNESIUM  PO, atorvastatin , naproxen , dapagliflozin  propanediol, metFORMIN , clopidogrel , amoxicillin -clavulanate, fluconazole , oxyCODONE -acetaminophen , Trulicity , and traMADol .   Meds ordered this encounter  Medications   ondansetron  (ZOFRAN -ODT) 4 MG disintegrating tablet    Sig: Take 1 tablet (4 mg total) by mouth every 8 (eight) hours as needed for nausea or vomiting.    Dispense:  40 tablet     Refill:  1    Supervising Provider:   LUIZ CHANNEL [[id number]]     Follow-up: Return in about 2 weeks (around 05/05/2024). or sooner if needed.   Cathlyn July, MSN, FNP-C Nurse Practitioner [facility] for Infectious Disease [facility] Main number: [phone]

## 2024-04-21 NOTE — Telephone Encounter (Signed)
 Called patient to inform of Ct for 05-12-24- arrival time 2:15 pm @ [facility], no restrictions to scan, patient to receive results from Donald Husband via telephone on 05-16-24 @ 1:30 pm, spoke with patient and he is aware of these appts. and the instructions

## 2024-04-22 LAB — CBC WITH DIFFERENTIAL/PLATELET
Absolute Lymphocytes: 1402 {cells}/uL (ref 850–3900)
Absolute Monocytes: 642 {cells}/uL (ref 200–950)
Basophils Absolute: 51 {cells}/uL (ref 0–200)
Basophils Relative: 0.7 %
Eosinophils Absolute: 350 {cells}/uL (ref 15–500)
Eosinophils Relative: 4.8 %
HCT: 47.5 % (ref 38.5–50.0)
Hemoglobin: 15.1 g/dL (ref 13.2–17.1)
MCH: 29.1 pg (ref 27.0–33.0)
MCHC: 31.8 g/dL — ABNORMAL LOW (ref 32.0–36.0)
MCV: 91.5 fL (ref 80.0–100.0)
MPV: 9.4 fL (ref 7.5–12.5)
Monocytes Relative: 8.8 %
Neutro Abs: 4855 {cells}/uL (ref 1500–7800)
Neutrophils Relative %: 66.5 %
Platelets: 255 Thousand/uL (ref 140–400)
RBC: 5.19 Million/uL (ref 4.20–5.80)
RDW: 14.2 % (ref 11.0–15.0)
Total Lymphocyte: 19.2 %
WBC: 7.3 Thousand/uL (ref 3.8–10.8)

## 2024-04-22 LAB — BASIC METABOLIC PANEL WITH GFR
BUN: 12 mg/dL (ref 7–25)
CO2: 24 mmol/L (ref 20–32)
Calcium: 10 mg/dL (ref 8.6–10.3)
Chloride: 100 mmol/L (ref 98–110)
Creat: 0.74 mg/dL (ref 0.70–1.30)
Glucose, Bld: 152 mg/dL — ABNORMAL HIGH (ref 65–99)
Potassium: 3.7 mmol/L (ref 3.5–5.3)
Sodium: 136 mmol/L (ref 135–146)
eGFR: 104 mL/min/1.73m2 (ref >=60)

## 2024-04-22 LAB — C-REACTIVE PROTEIN: CRP: 4 mg/L (ref <8.0)

## 2024-04-22 LAB — SEDIMENTATION RATE: Sed Rate: 9 mm/h (ref 0–20)

## 2024-04-24 DIAGNOSIS — M79602 Pain in left arm: Secondary | ICD-10-CM | POA: Insufficient documentation

## 2024-04-24 NOTE — Assessment & Plan Note (Signed)
 Tramadol  as directed. Needs close follow up with Ortho.

## 2024-04-24 NOTE — Progress Notes (Signed)
 Subjective:  Patient ID: Randy Hebert, male    DOB: 22-Aug-1966  Age: 58 y.o. MRN: [id number]  CC:   Chief Complaint  Patient presents with   Sleeping Problem    Requested trazodone    pain in left arm     Requested tramadol    Diarrhea    For 2 months     HPI:  58 year old male presents for evaluation of the above.  Having significant pain of the left arm. Twana been evaluated by vascular and ortho. 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  Decreased ROM. Has severe pain at night which interferes with sleep.   Patient Active Problem List   Diagnosis Date Noted   Pain of left upper extremity 04/24/2024   Acute postoperative anemia due to greater than expected blood loss 03/26/2024   Obesity, class 1 03/26/2024   Aneurysm of axillary artery (HCC) 03/24/2024   Axillary artery injury 03/12/2024   Acute CVA (cerebrovascular accident) (HCC) 03/02/2024   Essential hypertension 03/02/2024   Dyslipidemia 03/02/2024   Type 2 diabetes mellitus with hyperlipidemia (HCC) 03/02/2024   Erectile dysfunction 02/24/2023   Chronic eczematous otitis externa of both ears 02/05/2023   Tobacco abuse 12/25/2022   Malignant neoplasm of upper lobe of right lung (HCC) 09/25/2022   Cervical spondylosis with myelopathy 08/01/2022   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   DDD (degenerative disc disease), cervical 05/21/2022   Osteoarthritis of both hands 05/19/2022   History of GI bleed 11/13/2021   Insomnia 09/13/2021   Cirrhosis of liver (HCC) 11/08/2018   Barrett's esophagus 08/04/2018   Vitamin D  deficiency 06/30/2018   Alcohol  abuse 06/30/2018   DM type 2 causing vascular disease (HCC) 08/17/2017   Chronic combined systolic and diastolic CHF (congestive heart failure) (HCC) 05/15/2017   OSA (obstructive sleep apnea) 04/28/2017   Depression with anxiety 04/15/2017   GERD (gastroesophageal reflux disease) 09/23/2016   Asthmatic bronchitis , chronic (HCC) 08/27/2015   Mixed hyperlipidemia 08/27/2015    Class 1 obesity due to excess calories with serious comorbidity and body mass index (BMI) of 32.0 to 32.9 in adult 08/27/2015   CAD (coronary artery disease) 05/31/2015    Social Hx   Social History   Socioeconomic History   Marital status: Divorced    Spouse name: alice   Number of children: 3   Years of education: 12   Highest education level: Not on file  Occupational History   Occupation: SSI  Tobacco Use   Smoking status: Every Day    Current packs/day: 1.00    Average packs/day: 1 pack/day for 46.0 years (46.0 ttl pk-yrs)    Types: Cigarettes    Passive exposure: Never   Smokeless tobacco: Former    Types: Chew   Tobacco comments:    Smokes 1 pack a day of cigarettes (20 cig)10/16/23  Vaping Use   Vaping status: Former  Substance and Sexual Activity   Alcohol  use: Yes    Comment: rare beer use   Drug use: No    Comment: history of drug use- marijuana, cocaine- quit 2014   Sexual activity: Yes    Partners: Female    Birth control/protection: None  Other Topics Concern   Not on file  Social History Narrative   Lives with girlfriend [name]   3 children, but 1 OD  In 2019.   Grandchildren-2      Two cats: may and princess; and a dog-foxy      Enjoy: spending time with pets, TV, yard  work       Diet: eats all food groups   Caffeine: half and half coffee, some tea-half and half decaf   Water : 6-8 cups daily      Wears seat belt   Does not use phone while driving   Smoke detectors at home      Social Drivers of Health   Financial Resource Strain: Low Risk  (01/08/2024)   Overall Financial Resource Strain (CARDIA)    Difficulty of Paying Living Expenses: Not hard at all  Food Insecurity: No Food Insecurity (03/24/2024)   Hunger Vital Sign    Worried About Running Out of Food in the Last Year: Never true    Ran Out of Food in the Last Year: Never true  Transportation Needs: No Transportation Needs (03/24/2024)   PRAPARE - Scientist, research (physical sciences) (Medical): No    Lack of Transportation (Non-Medical): No  Physical Activity: Insufficiently Active (01/08/2024)   Exercise Vital Sign    Days of Exercise per Week: 2 days    Minutes of Exercise per Session: 60 min  Stress: No Stress Concern Present (01/08/2024)   [employer] of Occupational Health - Occupational Stress Questionnaire    Feeling of Stress : Not at all  Social Connections: Moderately Integrated (03/02/2024)   Social Connection and Isolation Panel    Frequency of Communication with Friends and Family: More than three times a week    Frequency of Social Gatherings with Friends and Family: Once a week    Attends Religious Services: More than 4 times per year    Active Member of [employer] or Organizations: Yes    Attends Engineer, structural: More than 4 times per year    Marital Status: Divorced    Review of Systems Per HPI  Objective:  BP 118/68   Pulse 84   Temp 98.1 F (36.7 C)   Ht 5' 5 (1.651 m)   Wt 175 lb (79.4 kg)   SpO2 98%   BMI 29.12 kg/m      04/21/2024    2:34 PM 04/21/2024   11:42 AM 04/18/2024    2:40 PM  BP/Weight  Systolic BP 112 118 133  Diastolic BP 76 68 79  Wt. (Lbs) 176 175 178  BMI 29.29 kg/m2 29.12 kg/m2 29.62 kg/m2    Physical Exam Vitals and nursing note reviewed.  Constitutional:      General: He is not in acute distress.    Appearance: Normal appearance.  HENT:     Head: Normocephalic and atraumatic.  Pulmonary:     Effort: Pulmonary effort is normal. No respiratory distress.  Musculoskeletal:     Comments: Weakness and decreased ROM of the left upper extremity.   Neurological:     Mental Status: He is alert.     Lab Results  Component Value Date   WBC 7.3 04/21/2024   HGB 15.1 04/21/2024   HCT 47.5 04/21/2024   PLT 255 04/21/2024   GLUCOSE 152 (H) 04/21/2024   CHOL 108 03/25/2024   TRIG 99 03/25/2024   HDL 23 (L) 03/25/2024   LDLCALC 65 03/25/2024   ALT 38 03/24/2024   AST 47 (H)  03/24/2024   NA 136 04/21/2024   K 3.7 04/21/2024   CL 100 04/21/2024   CREATININE 0.74 04/21/2024   BUN 12 04/21/2024   CO2 24 04/21/2024   TSH 3.61 06/21/2020   PSA 0.23 06/21/2020   INR 1.0 03/02/2024   HGBA1C 5.1 03/24/2024  MICROALBUR 1.8 11/09/2019     Assessment & Plan:  Pain of left upper extremity Assessment & Plan: Tramadol  as directed. Needs close follow up with Ortho.   Other orders -     traMADol  HCl; Take 1-2 tablets (50-100 mg total) by mouth every 8 (eight) hours as needed.  Dispense: 30 tablet; Refill: 0    Follow-up:  Return if symptoms worsen or fail to improve.  Jacqulyn Ahle DO [facility]

## 2024-04-25 ENCOUNTER — Ambulatory Visit: Payer: Self-pay | Admitting: Family

## 2024-04-25 NOTE — Progress Notes (Unsigned)
 GI Office Note    Referring Provider: Cook, Jayce G, DO Primary Care Physician:  Cook, Jayce G, DO Primary Gastroenterologist: Lamar HERO.Rourk, MD  Date:  04/26/2024  ID:  Randy Hebert, Randy Hebert [birth year], MRN [id number]   Chief Complaint   Chief Complaint  Patient presents with   Follow-up    Hospital follow up. Has a bruised liver from a fall. Stools have been a different color.     History of Present Illness  Randy Hebert is a 58 y.o. male with a history of anxiety, asthma, CAD, depression, diabetes, GERD, hyperlipidemia, HTN, and history of MI presenting today with complaint of abnormal colored stools and cirrhosis follow-up.  EGD 06/15/23: - Innocent appearing grade 1 esophageal varices? 2 columns.  - Abnormal distal esophagus consistent with prior diagnosis of Barrett' s esophagus. Status post biopsy.  - Portal hypertensive gastropathy.  - Normal duodenal bulb and second portion of the duodenum. - Surveillance EGD recommended in 18 months.    Last office visit 10/27/23 with Dr. Shaaron.History of gallbladder polyp and due for surveillance US . Patient stated he is not alcoholic. Denied drinking and driving. Reflux controlled with PPI BID.  History of grade 1 esophageal varices, not BB candidate. Urged alcohol  cessation. Discussed need for EGD April 2026 and colonoscopy in 2028. Advised update MELD labs.   Today:  Discussed the use of AI scribe software for clinical note transcription with the patient, who gave verbal consent to proceed.  Labs - July 2025 MELD 3.0: 11 at 03/02/2024  7:20 PM MELD-Na: 10 at 03/02/2024  7:20 PM Calculated from: Serum Creatinine: 0.7 mg/dL (Using min of 1 mg/dL) at 10/08/7972  2:79 PM Serum Sodium: 135 mmol/L at 03/02/2024  7:20 PM Total Bilirubin: 2.4 mg/dL at 10/08/7972  2:79 PM Serum Albumin : 4.1 g/dL (Using max of 3.5 g/dL) at 10/08/7972  2:79 PM INR(ratio): 1 at 03/02/2024  7:20 PM Age at listing (hypothetical): 66 years Sex: Male at 03/02/2024   7:20 PM  US : not US / CT 03/28/24 - stable cirrhosis - no focal abnormality noted.  AFP: needed in 6 months.  Hep A/B vaccination: unaware of prior vaccination.  EGD:  October 2024 with grade 1 varices BB: not candidate Ascites/peripheral edema: none Diuretics: lasix  as needed Paracentesis: none History of SBP: none Encephalopathy:  none  He has been experiencing dark green, loose stools occurring three to four times daily for the past two months. His bowel movements have not been solid since his stroke on June 30th. He has lost approximately 24 pounds, dropping from 195 to 171 pounds, and has a lack of appetite. His stools are sometimes dark gray against the toilet bowl, but there are no black, shiny stools or bright red blood recently. Describes mostly green stools.  He recalls a previous episode of red discoloration in his stool, attributed to drinking Powerade. Denies overt blood.  No nocturnal stools but they have been looser in nature.   He has a history of cirrhosis and is concerned about his liver health, especially with the bruising and gastrointestinal symptoms. He consumes milk and chicken noodle soup to stay hydrated and nourished but struggles with maintaining his weight and appetite. He is currently taking Plavix , which may contribute to easier bruising, and iron supplements daily, which can affect stool color. He also takes metformin  for diabetes, pantoprazole  for reflux, and trazodone  to aid sleep. Despite maintaining a soft diet, his stools remain unchanged. Not adhering to sodium restricted diet. No swelling in lower  extremities or abdomen.   He reports a fall after tripping over a screw, leading to a 23-day hospitalization. During this time, he sustained multiple injuries, including a bruised liver and a pseudoaneurysm, which required drainage of blood from his armpit. He suspects he may have fractured his collarbone, which he believes might be related to the fall. Still drinking  alcohol . Denies falling as result of alcohol  use.    He has a history of a mild stroke on June 30th, prior to the fall, and has been on blood thinners since then (Plavix ). He experienced hallucinations while on oxycodone , which he has since stopped taking. His current pain is manageable during the day but more pronounced at night, for which he uses trazodone  to help with sleep.  Per review of medications, he has received recent Augmentin .      Wt Readings from Last 10 Encounters:  04/26/24 171 lb 3.2 oz (77.7 kg)  04/21/24 176 lb (79.8 kg)  04/21/24 175 lb (79.4 kg)  04/18/24 178 lb (80.7 kg)  04/11/24 177 lb 12.8 oz (80.6 kg)  04/05/24 181 lb (82.1 kg)  03/24/24 190 lb (86.2 kg)  03/21/24 191 lb (86.6 kg)  03/11/24 193 lb (87.5 kg)  03/02/24 193 lb 12.6 oz (87.9 kg)    Current Outpatient Medications  Medication Sig Dispense Refill   acetaminophen  (TYLENOL ) 500 MG tablet Take 2 tablets (1,000 mg total) by mouth every 8 (eight) hours as needed for mild pain or fever.     amLODipine  (NORVASC ) 10 MG tablet Take 1 tablet (10 mg total) by mouth daily. 90 tablet 1   Ascorbic Acid  (VITAMIN C  PO) Take 1 tablet by mouth daily.     aspirin  EC 81 MG tablet Take 1 tablet (81 mg total) by mouth daily. Swallow whole. 30 tablet 12   atorvastatin  (LIPITOR ) 80 MG tablet Take 1 tablet (80 mg total) by mouth daily. 30 tablet 2   Cholecalciferol  (VITAMIN D -3 PO) Take 1 capsule by mouth daily.     citalopram  (CELEXA ) 20 MG tablet Take 1 tablet (20 mg total) by mouth daily. 90 tablet 1   clopidogrel  (PLAVIX ) 75 MG tablet Take 1 tablet (75 mg total) by mouth daily. 30 tablet 0   dapagliflozin  propanediol (FARXIGA ) 10 MG TABS tablet Take 1 tablet (10 mg total) by mouth daily. 30 tablet 0   Dulaglutide  (TRULICITY ) 1.5 MG/0.5ML SOAJ Inject 1.5 mg into the skin once a week. 6 mL 0   ezetimibe  (ZETIA ) 10 MG tablet Take 1 tablet (10 mg total) by mouth daily. 90 tablet 1   Ferrous Sulfate  (IRON PO) Take 1 tablet  by mouth daily.     fluconazole  (DIFLUCAN ) 150 MG tablet Take 1 tablet (150 mg total) by mouth daily. 5 tablet 0   furosemide  (LASIX ) 40 MG tablet Take 40 mg by mouth daily as needed (excessive swelling).     MAGNESIUM  PO Take 1 tablet by mouth 2 (two) times daily.     metFORMIN  (GLUCOPHAGE ) 500 MG tablet Take 1 tablet (500 mg total) by mouth 2 (two) times daily with a meal. 60 tablet 0   naproxen  (NAPROSYN ) 375 MG tablet Take 1 tablet (375 mg total) by mouth 2 (two) times daily as needed for moderate pain (pain score 4-6). 20 tablet 0   nitroGLYCERIN  (NITROSTAT ) 0.4 MG SL tablet Place 1 tablet (0.4 mg total) under the tongue every 5 (five) minutes x 3 doses as needed (if no relief after 3rd dose, proceed to ED or  call 911). 25 tablet 3   ondansetron  (ZOFRAN -ODT) 4 MG disintegrating tablet Take 1 tablet (4 mg total) by mouth every 8 (eight) hours as needed for nausea or vomiting. 40 tablet 1   oxyCODONE -acetaminophen  (PERCOCET/ROXICET) 5-325 MG tablet Take 1 tablet by mouth every 4 (four) hours as needed for severe pain (pain score 7-10). 15 tablet 0   pantoprazole  (PROTONIX ) 40 MG tablet TAKE (1) TABLET BY MOUTH TWICE DAILY BEFORE A MEAL. 180 tablet 1   RESTASIS  0.05 % ophthalmic emulsion Place 1 drop into both eyes 2 (two) times daily.     SYMBICORT  160-4.5 MCG/ACT inhaler INHALE 2 PUFFS INTO THE LUNGS FIRST THING IN THE MORNING AND 2 PUFFS ABOUT 12 HOURS LATER. 10.2 g 12   traMADol  (ULTRAM ) 50 MG tablet Take 1-2 tablets (50-100 mg total) by mouth every 8 (eight) hours as needed. 30 tablet 0   amoxicillin -clavulanate (AUGMENTIN ) 875-125 MG tablet Take 1 tablet by mouth every 12 (twelve) hours. (Patient not taking: Reported on 04/26/2024) 82 tablet 0   No current facility-administered medications for this visit.    Past Medical History:  Diagnosis Date   ACE inhibitor-aggravated angioedema    Allergy     Anemia    Angio-edema    Anxiety    Arthritis    Asthma    hip replacement   Back pain     Bradycardia 04/28/2017   Bulging of cervical intervertebral disc    CAD (coronary artery disease)    lateral STEMI 02/06/2015 00% D1 occlusion treated with Promus Premier 2.5 mm x 16 mm DES, 70% ramus stenosis, 40% mid RCA stenosis, 45% distal RCA stenosis, EF 45-50%   CHF (congestive heart failure) (HCC)    COPD (chronic obstructive pulmonary disease) (HCC)    Depression    Diabetes mellitus without complication (HCC)    Difficult intubation    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   Dry eye    Dyspnea    Early satiety 09/23/2016   Fatty liver    GERD (gastroesophageal reflux disease)    HCAP (healthcare-associated pneumonia) 05/15/2017   Headache    Heart murmur    Hip pain    Hyperlipidemia    Hypertension    Lobar pneumonia (HCC) 05/15/2017   Melena 08/04/2018   MI (myocardial infarction) [facility])    Myocardial infarction (HCC)    Neck pain    Non-ST elevation (NSTEMI) myocardial infarction (HCC) 04/27/2017   NSTEMI (non-ST elevated myocardial infarction) (HCC) 04/26/2017   Otitis media    Pleurisy    Pneumonia due to COVID-19 virus 10/07/2019   Rectal bleeding 11/08/2018   Right shoulder pain 03/20/2020   Sinus pause    9 sec sinus pause on telemetry after started on coreg  after MI, avoid AV nodal blocking agent   Sleep apnea    uses a cpap   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   STEMI (ST elevation myocardial infarction) (HCC) 05/31/2015   Substance abuse (HCC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   Syncope 06/29/2017   Syncope and collapse 05/15/2017   Transaminitis 08/04/2018   Unilateral primary osteoarthritis, right hip 07/01/2016    Past Surgical History:  Procedure Laterality Date   ARTERY EXPLORATION Left 03/24/2024   Procedure: LEFT ARCH AORTOGRAM, LEFT UPPER EXTREMITY ANGIOGRAM WITH FIRST ORDER CANNULATION, LEFT AXILLARY ANGIOPLASTY AND STENTING, EVACUATION OF LEFT AXILLARY HEMATOMA;  Surgeon: Magda Debby SAILOR, MD;  Location: [facility] OR;  Service:  Vascular;  Laterality: Left;   ARTERY  REPAIR Left 03/11/2024   Procedure: REPAIR LEFT AXILLARY ARTERY;  Surgeon: Serene Gaile ORN, MD;  Location: [facility] OR;  Service: Vascular;  Laterality: Left;  ARTERIAL REPAIR, LEFT AXILLARY   BIOPSY  10/09/2016   Procedure: BIOPSY;  Surgeon: Lamar CHRISTELLA Hollingshead, MD;  Location: [facility] ENDO SUITE;  Service: Endoscopy;;   BIOPSY  11/28/2019   Procedure: BIOPSY;  Surgeon: Hollingshead Lamar CHRISTELLA, MD;  Location: [facility] ENDO SUITE;  Service: Endoscopy;;   BIOPSY  06/15/2023   Procedure: BIOPSY;  Surgeon: Hollingshead Lamar CHRISTELLA, MD;  Location: [facility] ENDO SUITE;  Service: Endoscopy;;   BRONCHIAL BIOPSY  10/16/2022   Procedure: BRONCHIAL BIOPSIES;  Surgeon: Gladis Leonor CHRISTELLA, MD;  Location: [facility] ENDOSCOPY;  Service: Pulmonary;;   BRONCHIAL NEEDLE ASPIRATION BIOPSY  10/16/2022   Procedure: BRONCHIAL NEEDLE ASPIRATION BIOPSIES;  Surgeon: Gladis Leonor CHRISTELLA, MD;  Location: [facility] ENDOSCOPY;  Service: Pulmonary;;   BRONCHIAL WASHINGS  10/16/2022   Procedure: BRONCHIAL WASHINGS;  Surgeon: Gladis Leonor CHRISTELLA, MD;  Location: [facility] ENDOSCOPY;  Service: Pulmonary;;   CARDIAC CATHETERIZATION N/A 02/06/2015   Procedure: Left Heart Cath and Coronary Angiography;  Surgeon: Alm ORN Clay, MD;  Location: [facility] INVASIVE CV LAB;  Service: Cardiovascular;  Laterality: N/A;   CARDIAC CATHETERIZATION N/A 02/06/2015   Procedure: Coronary Stent Intervention;  Surgeon: Alm ORN Clay, MD;  Location: [facility] INVASIVE CV LAB;  Service: Cardiovascular;  Laterality: N/A;   COLONOSCOPY WITH PROPOFOL  N/A 10/09/2016   Sigmoid and descending colon diverticulosis, four 4-6 mm polyps in sigmoid, one 4 mm polyp in descending. Tubular adenomas and hyperplastic. 5 year surveillance.    COLONOSCOPY WITH PROPOFOL  N/A 11/24/2016   Sigmoid and descending colon diverticulosis, four 4-6 mm polyps in sigmoid, one 4 mm polyp in descending. Tubular adenomas and hyperplastic. 5 year surveillance.    COLONOSCOPY WITH PROPOFOL  N/A 10/18/2021   Carver:  nonbleeding internal hemorrhoids, small and large mouth diverticula found in the sigmoid, descending, transverse colon.  A 9 mm sessile polyp was found in the ascending colon that was removed.  4 sessile polyps found in the sigmoid, descending, transverse colon 3 to 5 mm in size, examining otherwise.  Path revealed tubular adenomas.  Repeat due in 5 years for surveillance.   CORONARY ANGIOPLASTY WITH STENT PLACEMENT  01/2015   CORONARY STENT INTERVENTION N/A 04/27/2017   Procedure: CORONARY STENT INTERVENTION;  Surgeon: Mady Bruckner, MD;  Location: [facility] INVASIVE CV LAB;  Service: Cardiovascular;  Laterality: N/A;   ELECTROPHYSIOLOGY STUDY N/A 06/29/2017   Procedure: ELECTROPHYSIOLOGY STUDY;  Surgeon: Waddell Danelle ORN, MD;  Location: [facility] INVASIVE CV LAB;  Service: Cardiovascular;  Laterality: N/A;   ESOPHAGOGASTRODUODENOSCOPY (EGD) WITH PROPOFOL  N/A 10/09/2016   Dr. Hollingshead: LA grade a esophagitis.  Barrett's esophagus, biopsy-proven.  Small hiatal hernia.  EGD February 2019   ESOPHAGOGASTRODUODENOSCOPY (EGD) WITH PROPOFOL  N/A 11/28/2019    salmon-colored esophageal mucosa (Barrett's) small hiatal hernia, portal hypertensive gastropathy, normal duodenum, 3 year surveillance   ESOPHAGOGASTRODUODENOSCOPY (EGD) WITH PROPOFOL  N/A 09/06/2021   three columns of grade 1 varices in distal esophagus, no stigmata of bleeding or red wale signs. Small hiatal hernia. Mild portal gastropathy. Normal duodenum. Repeat upper endoscopy in 1 year for surveillance.   ESOPHAGOGASTRODUODENOSCOPY (EGD) WITH PROPOFOL  N/A 06/15/2023   Procedure: ESOPHAGOGASTRODUODENOSCOPY (EGD) WITH PROPOFOL ;  Surgeon: Hollingshead Lamar CHRISTELLA, MD;  Location[facility];  Service: Endoscopy;  Laterality: N/A;  830am, asa 3   FIDUCIAL MARKER PLACEMENT  10/16/2022   Procedure: FIDUCIAL MARKER PLACEMENT;  Surgeon: Gladis Leonor CHRISTELLA, MD;  Location: [facility] ENDOSCOPY;  Service: Pulmonary;;   INCISION / DRAINAGE HAND / FINGER     LEFT HEART CATH AND CORONARY  ANGIOGRAPHY N/A 04/27/2017   Procedure: LEFT HEART CATH AND CORONARY ANGIOGRAPHY;  Surgeon: Mady Bruckner, MD;  Location: [facility] INVASIVE CV LAB;  Service: Cardiovascular;  Laterality: N/A;   LOOP RECORDER INSERTION  06/29/2017   Procedure: Loop Recorder Insertion;  Surgeon: Waddell Danelle ORN, MD;  Location: [facility] INVASIVE CV LAB;  Service: Cardiovascular;;   POLYPECTOMY  11/24/2016   Procedure: POLYPECTOMY;  Surgeon: Lamar CHRISTELLA Hollingshead, MD;  Location: [facility];  Service: Endoscopy;;  descending and sigmoid   POLYPECTOMY  10/18/2021   Procedure: POLYPECTOMY;  Surgeon: Cindie Carlin POUR, DO;  Location: [facility];  Service: Endoscopy;;   TOTAL HIP ARTHROPLASTY Right 07/01/2016   TOTAL HIP ARTHROPLASTY Right 07/01/2016   Procedure: RIGHT TOTAL HIP ARTHROPLASTY ANTERIOR APPROACH;  Surgeon: Bruckner CINDERELLA Poli, MD;  Location: [facility] OR;  Service: Orthopedics;  Laterality: Right;   ULTRASOUND GUIDANCE FOR VASCULAR ACCESS Left 03/24/2024   Procedure: ULTRASOUND GUIDANCE, FOR VASCULAR ACCESS;  Surgeon: Magda Debby SAILOR, MD;  Location: [facility] OR;  Service: Vascular;  Laterality: Left;    Family History  Problem Relation Age of Onset   Heart attack Father    Stroke Father    Arthritis Father    Heart disease Father    Cancer Mother        ???   Arthritis Mother    Heart disease Brother 30       died in sleep   Early death Brother    Diabetes Maternal Uncle    Alzheimer's disease Maternal Grandmother     Allergies as of 04/26/2024 - Review Complete 04/21/2024  Allergen Reaction Noted   Coreg  [carvedilol ] Other (See Comments) 02/08/2015   Zestril  [lisinopril ] Anaphylaxis, Shortness Of Breath, and Swelling 05/30/2015   Chantix  [varenicline ] Other (See Comments) 07/16/2021   Amoxil  [amoxicillin ] Nausea Only 02/06/2015    Social History   Socioeconomic History   Marital status: Divorced    Spouse name: alice   Number of children: 3   Years of education: 12   Highest education level: Not on  file  Occupational History   Occupation: SSI  Tobacco Use   Smoking status: Every Day    Current packs/day: 1.00    Average packs/day: 1 pack/day for 46.0 years (46.0 ttl pk-yrs)    Types: Cigarettes    Passive exposure: Never   Smokeless tobacco: Former    Types: Chew   Tobacco comments:    Smokes 1 pack a day of cigarettes (20 cig)10/16/23  Vaping Use   Vaping status: Former  Substance and Sexual Activity   Alcohol  use: Yes    Comment: rare beer use   Drug use: No    Comment: history of drug use- marijuana, cocaine- quit 2014   Sexual activity: Yes    Partners: Female    Birth control/protection: None  Other Topics Concern   Not on file  Social History Narrative   Lives with girlfriend [name]   3 children, but 1 OD  In 2019.   Grandchildren-2      Two cats: may and princess; and a dog-foxy      Enjoy: spending time with pets, TV, yard work       Diet: eats all food groups   Caffeine: half and half coffee, some tea-half and half decaf   Water : 6-8 cups daily      Wears  seat belt   Does not use phone while driving   Smoke detectors at home      Social Drivers of Health   Financial Resource Strain: Low Risk  (01/08/2024)   Overall Financial Resource Strain (CARDIA)    Difficulty of Paying Living Expenses: Not hard at all  Food Insecurity: No Food Insecurity (03/24/2024)   Hunger Vital Sign    Worried About Running Out of Food in the Last Year: Never true    Ran Out of Food in the Last Year: Never true  Transportation Needs: No Transportation Needs (03/24/2024)   PRAPARE - Administrator, Civil Service (Medical): No    Lack of Transportation (Non-Medical): No  Physical Activity: Insufficiently Active (01/08/2024)   Exercise Vital Sign    Days of Exercise per Week: 2 days    Minutes of Exercise per Session: 60 min  Stress: No Stress Concern Present (01/08/2024)   [employer] of Occupational Health - Occupational Stress Questionnaire    Feeling of  Stress : Not at all  Social Connections: Moderately Integrated (03/02/2024)   Social Connection and Isolation Panel    Frequency of Communication with Friends and Family: More than three times a week    Frequency of Social Gatherings with Friends and Family: Once a week    Attends Religious Services: More than 4 times per year    Active Member of [employer] or Organizations: Yes    Attends Engineer, structural: More than 4 times per year    Marital Status: Divorced     Review of Systems   Gen: Denies fever, chills, anorexia. Denies fatigue, weakness, weight loss.  CV: Denies chest pain, palpitations, syncope, peripheral edema, and claudication. Resp: Denies dyspnea at rest, cough, wheezing, coughing up blood, and pleurisy. GI: See HPI Derm: Denies rash, itching, dry skin Psych: Denies depression, anxiety, memory loss, confusion. No homicidal or suicidal ideation.  Heme: Denies bruising, bleeding, and enlarged lymph nodes.  Physical Exam   BP 119/71 (BP Location: Right Arm, Patient Position: Sitting, Cuff Size: Large)   Pulse 88   Temp 98 F (36.7 C) (Temporal)   Ht 5' 5 (1.651 m)   Wt 171 lb 3.2 oz (77.7 kg)   BMI 28.49 kg/m   General:   Alert and oriented. No distress noted. Pleasant and cooperative.  Head:  Normocephalic and atraumatic. Eyes:  Conjuctiva clear without scleral icterus. Mouth:  Oral mucosa pink and moist. Good dentition. No lesions. Lungs:  Clear to auscultation bilaterally. No wheezes, rales, or rhonchi. No distress.  Heart:  S1, S2 present without murmurs appreciated.  Abdomen:  +BS, soft, rounded. Non tender. Soft reducible umbilical hernia.  No rebound or guarding. Rectal: deferred Msk:  Symmetrical without gross deformities. Normal posture. Left arm in sling and hand swollen.  Extremities:  Without edema.  Neurologic:  Alert and  oriented x4 Psych:  Alert and cooperative. Normal mood and affect.  Assessment & Plan  Randy Hebert is a 58 y.o.  male presenting today with diarrhea and cirrhosis follow up.      Loose stools and unintentional weight loss under evaluation Chronic loose stools and unintentional weight loss since recent hospitalization. Stools are dark green, likely related to iron supplementation and recent bleeding from pseudoaneurysm. No bright red blood or black shiny stools reported. Weight loss of approximately 24 pounds since hospitalization. Appetite decreased, with difficulty maintaining nutritional intake. No fevers or abdominal pain reported. Differential includes malabsorption, infection, or effects of  recent surgery and medications. - Order stool studies to evaluate for infection, including Cdiff and GI profile.  - Recommend protein shakes, especially at night, to support nutritional intake. - Advised taking Metamucil or fiber supplement to add substance to stools. - Monitor weight and appetite closely. - Consider earlier colonoscopy if weight loss persists  - Consider elastase if ongoing diarrhea and weight loss.   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 No signs of hepatic encephalopathy. MELD score stable. Last EGD in Oct 2024 wiht grade 1 varices and not BB candidate. He is on Plavix  and iron, which may contribute to stool color and friability of tissues. Recent imaging without hepatoma - Encourage protein intake, especially at night, to support liver function. - RUQ US  every 6 months.  - AFP annually - MELD labs every 6 months.  - Encouraged 2g sodium diet - Due for EGD for variceal surveillance Feb 2026  Recent fall with multiple injuries (liver contusion, pseudoaneurysm, clavicle fracture, rib and thoracic bruising) Recent fall resulting in multiple injuries including liver contusion, pseudoaneurysm, clavicle fracture, and rib and thoracic bruising. Liver contusion is healing well with no current pain. Clavicle fracture suspected, with possible old  injury on the opposite side. Bruising and swelling have decreased over time. - Encourage elevation of swollen areas to reduce swelling. - Continue to follow with other specialist  Gastroesophageal reflux disease (GERD), Barrett's GERD managed with pantoprazole . Occasional heartburn reported, especially after consuming certain foods like onions. - Continue pantoprazole  as prescribed. - GERD diet - If Cdiff present may need to consider coming off PPI although higher risk given barretts     Follow up    Follow up 2 months for weight check  Charmaine Melia, MSN, FNP-BC, AGACNP-BC [facility]

## 2024-04-26 ENCOUNTER — Encounter: Payer: Self-pay | Admitting: Gastroenterology

## 2024-04-26 ENCOUNTER — Ambulatory Visit: Admitting: Gastroenterology

## 2024-04-26 VITALS — BP 119/71 | HR 88 | Temp 98.0°F | Ht 65.0 in | Wt 171.2 lb

## 2024-04-26 DIAGNOSIS — K219 Gastro-esophageal reflux disease without esophagitis: Secondary | ICD-10-CM | POA: Diagnosis not present

## 2024-04-26 DIAGNOSIS — K2270 Barrett's esophagus without dysplasia: Secondary | ICD-10-CM

## 2024-04-26 DIAGNOSIS — R634 Abnormal weight loss: Secondary | ICD-10-CM

## 2024-04-26 DIAGNOSIS — W19XXXS Unspecified fall, sequela: Secondary | ICD-10-CM

## 2024-04-26 DIAGNOSIS — T07XXXA Unspecified multiple injuries, initial encounter: Secondary | ICD-10-CM

## 2024-04-26 DIAGNOSIS — R197 Diarrhea, unspecified: Secondary | ICD-10-CM

## 2024-04-26 DIAGNOSIS — W19XXXA Unspecified fall, initial encounter: Secondary | ICD-10-CM

## 2024-04-26 DIAGNOSIS — K7030 Alcoholic cirrhosis of liver without ascites: Secondary | ICD-10-CM | POA: Diagnosis not present

## 2024-04-26 NOTE — Patient Instructions (Signed)
 VISIT SUMMARY:  During your visit, we discussed your ongoing gastrointestinal symptoms, weight loss, and overall health. We reviewed your history of cirrhosis, recent fall injuries, and current medications. We also addressed your concerns about your liver health and nutritional intake.  YOUR PLAN:  -LOOSE STOOLS AND UNINTENTIONAL WEIGHT LOSS: You have been experiencing loose stools and weight loss since your recent hospitalization. This could be due to several factors, including malabsorption, infection, or effects of recent surgery and medications. We will conduct stool studies to check for infection and malabsorption. To help with your nutrition, we recommend protein shakes, especially at night, and taking Metamucil or a fiber supplement to add substance to your stools (1 tablespoon daily). Please monitor your weight and appetite closely. If your weight loss continues or the stool studies are abnormal, we may consider an earlier colonoscopy.   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 Your recent liver contusion is healing well. To support your liver function, we encourage you to increase your protein intake, especially at night.  Cirrhosis Lifestyle Recommendations:  High-protein diet from a primarily plant-based diet. Avoid red meat.  No raw or undercooked meat, seafood, or shellfish. Low-fat/cholesterol/carbohydrate diet. Limit sodium to no more than 2000 mg/day including everything that you eat and drink. Recommend at least 30 minutes of aerobic and resistance exercise 3 days/week. Limit Tylenol  to 2000 mg daily.  Avoid alcohol .   -RECENT FALL WITH MULTIPLE INJURIES: You recently fell and sustained multiple injuries, including a liver contusion, pseudoaneurysm, clavicle fracture, and rib and thoracic bruising. The liver contusion is healing well, and the bruising and swelling have decreased. To help reduce swelling,  please elevate the swollen areas.  -GASTROESOPHAGEAL REFLUX DISEASE (GERD): GERD is a condition where stomach acid frequently flows back into the tube connecting your mouth and stomach. You are managing this with pantoprazole . Please continue taking pantoprazole  as prescribed to manage your symptoms.  Follow a GERD diet:  Avoid fried, fatty, greasy, spicy, citrus foods. Avoid caffeine and carbonated beverages. Avoid chocolate. Try eating 4-6 small meals a day rather than 3 large meals. Do not eat within 3 hours of laying down. Prop head of bed up on wood or bricks to create a 6 inch incline.   INSTRUCTIONS:  We will conduct stool studies to evaluate for infection and malabsorption. Please monitor your weight and appetite closely. If your weight loss continues or the stool studies are abnormal, we may consider an earlier colonoscopy. Follow up in 2 months for weight check. US  and MELD labs in 6 January.   It was a pleasure to see you today. I want to create trusting relationships with patients. If you receive a survey regarding your visit,  I greatly appreciate you taking time to fill this out on paper or through your MyChart. I value your feedback.  Charmaine Melia, MSN, FNP-BC, AGACNP-BC [facility]

## 2024-04-28 NOTE — Progress Notes (Signed)
 LVM for the patient

## 2024-04-29 ENCOUNTER — Ambulatory Visit: Payer: Self-pay | Admitting: Gastroenterology

## 2024-04-29 ENCOUNTER — Encounter: Payer: Self-pay | Admitting: Gastroenterology

## 2024-04-29 DIAGNOSIS — A498 Other bacterial infections of unspecified site: Secondary | ICD-10-CM

## 2024-04-29 LAB — GI PROFILE, STOOL, PCR

## 2024-04-29 MED ORDER — FIDAXOMICIN 200 MG PO TABS: 200.0000 mg | ORAL_TABLET | Freq: Two times a day (BID) | ORAL | 0 refills | Status: AC

## 2024-04-30 LAB — CLOSTRIDIUM DIFFICILE EIA: C difficile Toxins A+B, EIA: NEGATIVE

## 2024-05-03 ENCOUNTER — Other Ambulatory Visit: Payer: Self-pay | Admitting: Family Medicine

## 2024-05-03 ENCOUNTER — Ambulatory Visit: Attending: Vascular Surgery | Admitting: Physician Assistant

## 2024-05-03 VITALS — BP 117/76 | HR 96 | Temp 97.7°F | Wt 172.3 lb

## 2024-05-03 DIAGNOSIS — S45001S Unspecified injury of axillary artery, right side, sequela: Secondary | ICD-10-CM

## 2024-05-03 NOTE — Progress Notes (Signed)
 noted

## 2024-05-03 NOTE — Progress Notes (Signed)
 POST OPERATIVE OFFICE NOTE    CC:  F/u for surgery  HPI:  Randy Hebert is a 58 y.o. male who is here for wound check.  He recently underwent open exposure and repair of a left axillary pseudoaneurysm and evacuation of a left axillary hematoma on 03/12/2024 by Dr. Serene.  This was done for active extravasation from the left axillary artery after a dislocated shoulder.  He subsequently developed significant swelling and pain in his left chest and axillary region.  He eventually required readmission and underwent aortic arch angiogram with left axillary artery angioplasty and stenting and incision and drainage of a left axillary hematoma on 03/24/2024 by Dr. Magda.  His wound bed cultures were positive for Enterococcus faecalis and Streptococcus constellatus.  ID recommended 6 weeks of Augmentin  postoperatively.  At his last office visit his incision was healing well and all staples were removed.  He finished a course of Diflucan  for a yeast infection.  He continued to have significant pain, mobility issues, and swelling in his left forearm and hand.  He was referred to orthopedics for further evaluation.  He says that he was seen by an orthopedic doctor a few weeks ago and they suspect ongoing nerve injury is the cause of his pain and mobility issues.  He is being referred to a specialist at [facility].  He was encouraged to keep his left arm elevated to help with his swelling.  He continues to have a hard time sleeping at night due to pain in his left forearm.  Currently his PCP is managing his pain medication.  He is also still going through physical therapy.    Allergies  Allergen Reactions   Coreg  [Carvedilol ] Other (See Comments)    Sinus pause on telemetry >3 seconds. Longest one 9 sec. No AV nodal agent   Zestril  [Lisinopril ] Anaphylaxis, Shortness Of Breath and Swelling    Angioedema, required intubation and mechanical ventilation   Chantix  [Varenicline ] Other (See Comments)     Nightmares Insomnia    Amoxil  [Amoxicillin ] Nausea Only    Current Outpatient Medications  Medication Sig Dispense Refill   acetaminophen  (TYLENOL ) 500 MG tablet Take 2 tablets (1,000 mg total) by mouth every 8 (eight) hours as needed for mild pain or fever.     amLODipine  (NORVASC ) 10 MG tablet Take 1 tablet (10 mg total) by mouth daily. 90 tablet 1   amoxicillin -clavulanate (AUGMENTIN ) 875-125 MG tablet Take 1 tablet by mouth every 12 (twelve) hours. (Patient not taking: Reported on 04/26/2024) 82 tablet 0   Ascorbic Acid  (VITAMIN C  PO) Take 1 tablet by mouth daily.     aspirin  EC 81 MG tablet Take 1 tablet (81 mg total) by mouth daily. Swallow whole. 30 tablet 12   atorvastatin  (LIPITOR ) 80 MG tablet Take 1 tablet (80 mg total) by mouth daily. 30 tablet 2   Cholecalciferol  (VITAMIN D -3 PO) Take 1 capsule by mouth daily.     citalopram  (CELEXA ) 20 MG tablet Take 1 tablet (20 mg total) by mouth daily. 90 tablet 1   clopidogrel  (PLAVIX ) 75 MG tablet Take 1 tablet (75 mg total) by mouth daily. 30 tablet 0   dapagliflozin  propanediol (FARXIGA ) 10 MG TABS tablet Take 1 tablet (10 mg total) by mouth daily. 30 tablet 0   Dulaglutide  (TRULICITY ) 1.5 MG/0.5ML SOAJ Inject 1.5 mg into the skin once a week. 6 mL 0   ezetimibe  (ZETIA ) 10 MG tablet Take 1 tablet (10 mg total) by mouth daily. 90 tablet 1  Ferrous Sulfate  (IRON PO) Take 1 tablet by mouth daily.     fidaxomicin  (DIFICID ) 200 MG TABS tablet Take 1 tablet (200 mg total) by mouth 2 (two) times daily for 10 days. 20 tablet 0   fluconazole  (DIFLUCAN ) 150 MG tablet Take 1 tablet (150 mg total) by mouth daily. 5 tablet 0   furosemide  (LASIX ) 40 MG tablet Take 40 mg by mouth daily as needed (excessive swelling).     MAGNESIUM  PO Take 1 tablet by mouth 2 (two) times daily.     metFORMIN  (GLUCOPHAGE ) 500 MG tablet Take 1 tablet (500 mg total) by mouth 2 (two) times daily with a meal. 60 tablet 0   naproxen  (NAPROSYN ) 375 MG tablet Take 1 tablet (375  mg total) by mouth 2 (two) times daily as needed for moderate pain (pain score 4-6). 20 tablet 0   nitroGLYCERIN  (NITROSTAT ) 0.4 MG SL tablet Place 1 tablet (0.4 mg total) under the tongue every 5 (five) minutes x 3 doses as needed (if no relief after 3rd dose, proceed to ED or call 911). 25 tablet 3   ondansetron  (ZOFRAN -ODT) 4 MG disintegrating tablet Take 1 tablet (4 mg total) by mouth every 8 (eight) hours as needed for nausea or vomiting. 40 tablet 1   oxyCODONE -acetaminophen  (PERCOCET/ROXICET) 5-325 MG tablet Take 1 tablet by mouth every 4 (four) hours as needed for severe pain (pain score 7-10). 15 tablet 0   pantoprazole  (PROTONIX ) 40 MG tablet TAKE (1) TABLET BY MOUTH TWICE DAILY BEFORE A MEAL. 180 tablet 1   RESTASIS  0.05 % ophthalmic emulsion Place 1 drop into both eyes 2 (two) times daily.     SYMBICORT  160-4.5 MCG/ACT inhaler INHALE 2 PUFFS INTO THE LUNGS FIRST THING IN THE MORNING AND 2 PUFFS ABOUT 12 HOURS LATER. 10.2 g 12   traMADol  (ULTRAM ) 50 MG tablet Take 1-2 tablets (50-100 mg total) by mouth every 8 (eight) hours as needed. 30 tablet 0   No current facility-administered medications for this visit.     ROS:  See HPI  Physical Exam:  Incision: Left axillary incision well-healed without signs of infection or hematoma Extremities:  left shoulder sag.  Edema of distal left forearm and hand.  Left hand is warm with a 2+ radial pulse Neuro: Significantly decreased mobility of the left arm past the elbow.  Slightly able to wiggle his fingers on the left    Assessment/Plan:  This is a 58 y.o. male who is here for a wound check  -Patient recently underwent open repair of a left axillary artery pseudoaneurysm, followed by recurrent hematoma requiring left axillary artery stenting and hematoma washout.  His wound cultures were positive for Enterococcus faecalis and Streptococcus constellatus and ID recommended Augmentin  for 6 weeks postop - The patient has been evaluated  postoperatively by orthopedics for ongoing left forearm and hand pain, swelling, and weakness.  X-rays in the left arm are negative for fracture.  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  He is being referred to a specialist at [facility] for further management. - His left axillary incision is well-healed without signs of infection or hematoma - His left upper extremity is well-perfused.  His left hand is warm to touch.  He has a palpable left radial pulse. - He has no indications for further vascular intervention at this time.  I have encouraged him to continue to elevate his left arm to help with his swelling.  He will continue to follow-up with orthopedics.  He can follow-up with our office in 6 weeks with left upper extremity arterial duplex to evaluate his axillary stent   Ahmed Holster, PA-C [facility] [phone]   Clinic MD:  Magda

## 2024-05-05 ENCOUNTER — Other Ambulatory Visit: Payer: Self-pay | Admitting: Family Medicine

## 2024-05-05 ENCOUNTER — Other Ambulatory Visit: Payer: Self-pay | Admitting: *Deleted

## 2024-05-05 DIAGNOSIS — I721 Aneurysm of artery of upper extremity: Secondary | ICD-10-CM

## 2024-05-06 ENCOUNTER — Ambulatory Visit: Payer: Self-pay | Admitting: Family Medicine

## 2024-05-06 NOTE — Telephone Encounter (Signed)
 FYI Only or Action Required?: Action required by provider: medication refill request.  Patient was last seen in primary care on 04/21/2024 by Cook, Jayce G, DO.  Called Nurse Triage reporting Arm Pain.  Symptoms began several months ago.  Interventions attempted: Prescription medications: tramadol  .  Symptoms are: gradually worsening.  Triage Disposition: Go to ED Now (or PCP Triage)  Patient/caregiver understands and will follow disposition?: UnsureCopied from CRM #[id number]. Topic: Clinical - Red Word Triage >> May 06, 2024 11:19 AM Charlet HERO wrote: Red Word that prompted transfer to Nurse Triage: patient is calling about having issue where he had surgery some months ago he is stating that  his hand is swollen and bruised it is sore and throbbing. He has a script for tramadol  and he is having an issue with filling it he is stating that he cannot sleep at all at night and needs to have something called in. States his collar bones are broken and then hurt also has heart condition and has been taking motrin  and he is not suppose to. Dr. [facility] Reason for Disposition  [1] SEVERE pain (e.g., excruciating) AND [2] not improved 2 hours after pain medicine  Answer Assessment - Initial Assessment Questions Pt has extensive vein damage and recently seen by vascular office on 9/2. Pt is asking for pain and sleep medication. Specifically, Tramadol  to be called in.  Pt was evaluated by vascular office to have sutures removed and noted the swelling and bruising of arm. Pt stated they learned I have a fractured collarbone too. Pt called in yesterday to have Tramadol  refilled.  RN called [facility]. RN advised ED to if pain worsens before hearing from office. Pt stated  I just don't want to upset anymore by getting more folks involved.     1. ONSET: When did the pain start?     July 9th 2. LOCATION: Where is the pain located?     Left arm  3. PAIN: How bad is the pain? (Scale 0-10;  or none, mild, moderate, severe)     Severe 4. WORK OR EXERCISE: Has there been any recent work or exercise that involved this part of the body?     [place]  5. CAUSE: What do you think is causing the arm pain?     fall 6. OTHER SYMPTOMS: Do you have any other symptoms? (e.g., neck pain, swelling, rash, fever, numbness, weakness)     Arm is swollen and purple.  Protocols used: Arm Pain-A-AH

## 2024-05-06 NOTE — Telephone Encounter (Signed)
 Left message for patient to call back and schedule an appointment and that the office agrees that he needs to go to ED for evaluation.

## 2024-05-09 ENCOUNTER — Encounter: Payer: Self-pay | Admitting: Physical Therapy

## 2024-05-09 ENCOUNTER — Ambulatory Visit: Attending: Internal Medicine | Admitting: Physical Therapy

## 2024-05-09 ENCOUNTER — Ambulatory Visit

## 2024-05-09 DIAGNOSIS — M6281 Muscle weakness (generalized): Secondary | ICD-10-CM | POA: Diagnosis present

## 2024-05-09 DIAGNOSIS — M25512 Pain in left shoulder: Secondary | ICD-10-CM | POA: Diagnosis present

## 2024-05-09 NOTE — Therapy (Signed)
 OUTPATIENT PHYSICAL THERAPY TREATMENT   Patient Name: ELYON ZOLL MRN: [id number] DOB:[birth year], 58 y.o., male Today's Date: 05/09/2024  END OF SESSION:  PT End of Session - 05/09/24 0754     Visit Number 5    Number of Visits 20    Date for PT Re-Evaluation 06/23/24    Progress Note Due on Visit 10    PT Start Time 0755    PT Stop Time 0835    PT Time Calculation (min) 40 min    Activity Tolerance Patient tolerated treatment well    Behavior During Therapy [facility] for tasks assessed/performed            Past Medical History:  Diagnosis Date   ACE inhibitor-aggravated angioedema    Allergy     Anemia    Angio-edema    Anxiety    Arthritis    Asthma    hip replacement   Back pain    Bradycardia 04/28/2017   Bulging of cervical intervertebral disc    CAD (coronary artery disease)    lateral STEMI 02/06/2015 00% D1 occlusion treated with Promus Premier 2.5 mm x 16 mm DES, 70% ramus stenosis, 40% mid RCA stenosis, 45% distal RCA stenosis, EF 45-50%   CHF (congestive heart failure) (HCC)    COPD (chronic obstructive pulmonary disease) (HCC)    Depression    Diabetes mellitus without complication (HCC)    Difficult intubation    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   Dry eye    Dyspnea    Early satiety 09/23/2016   Fatty liver    GERD (gastroesophageal reflux disease)    HCAP (healthcare-associated pneumonia) 05/15/2017   Headache    Heart murmur    Hip pain    Hyperlipidemia    Hypertension    Lobar pneumonia (HCC) 05/15/2017   Melena 08/04/2018   MI (myocardial infarction) (HCC)    Myocardial infarction (HCC)    Neck pain    Non-ST elevation (NSTEMI) myocardial infarction (HCC) 04/27/2017   NSTEMI (non-ST elevated myocardial infarction) (HCC) 04/26/2017   Otitis media    Pleurisy    Pneumonia due to COVID-19 virus 10/07/2019   Rectal bleeding 11/08/2018   Right shoulder pain 03/20/2020   Sinus pause    9 sec sinus pause on telemetry  after started on coreg  after MI, avoid AV nodal blocking agent   Sleep apnea    uses a cpap   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   STEMI (ST elevation myocardial infarction) (HCC) 05/31/2015   Substance abuse (HCC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   Syncope 06/29/2017   Syncope and collapse 05/15/2017   Transaminitis 08/04/2018   Unilateral primary osteoarthritis, right hip 07/01/2016   Past Surgical History:  Procedure Laterality Date   ARTERY EXPLORATION Left 03/24/2024   Procedure: LEFT ARCH AORTOGRAM, LEFT UPPER EXTREMITY ANGIOGRAM WITH FIRST ORDER CANNULATION, LEFT AXILLARY ANGIOPLASTY AND STENTING, EVACUATION OF LEFT AXILLARY HEMATOMA;  Surgeon: Magda Debby SAILOR, MD;  Location: [facility] OR;  Service: Vascular;  Laterality: Left;   ARTERY REPAIR Left 03/11/2024   Procedure: REPAIR LEFT AXILLARY ARTERY;  Surgeon: Serene Gaile ORN, MD;  Location: [facility] OR;  Service: Vascular;  Laterality: Left;  ARTERIAL REPAIR, LEFT AXILLARY   BIOPSY  10/09/2016   Procedure: BIOPSY;  Surgeon: Lamar CHRISTELLA Hollingshead, MD;  Location: [facility] ENDO SUITE;  Service: Endoscopy;;   BIOPSY  11/28/2019   Procedure: BIOPSY;  Surgeon: Hollingshead Lamar CHRISTELLA, MD;  Location: [facility];  Service: Endoscopy;;   BIOPSY  06/15/2023   Procedure: BIOPSY;  Surgeon: Shaaron Lamar HERO, MD;  Location: [facility];  Service: Endoscopy;;   BRONCHIAL BIOPSY  10/16/2022   Procedure: BRONCHIAL BIOPSIES;  Surgeon: Gladis Leonor HERO, MD;  Location: [facility] ENDOSCOPY;  Service: Pulmonary;;   BRONCHIAL NEEDLE ASPIRATION BIOPSY  10/16/2022   Procedure: BRONCHIAL NEEDLE ASPIRATION BIOPSIES;  Surgeon: Gladis Leonor HERO, MD;  Location: [facility] ENDOSCOPY;  Service: Pulmonary;;   BRONCHIAL WASHINGS  10/16/2022   Procedure: BRONCHIAL WASHINGS;  Surgeon: Gladis Leonor HERO, MD;  Location: [facility] ENDOSCOPY;  Service: Pulmonary;;   CARDIAC CATHETERIZATION N/A 02/06/2015   Procedure: Left Heart Cath and Coronary Angiography;  Surgeon: Alm LELON Clay, MD;  Location: [facility] INVASIVE CV  LAB;  Service: Cardiovascular;  Laterality: N/A;   CARDIAC CATHETERIZATION N/A 02/06/2015   Procedure: Coronary Stent Intervention;  Surgeon: Alm LELON Clay, MD;  Location: [facility] INVASIVE CV LAB;  Service: Cardiovascular;  Laterality: N/A;   COLONOSCOPY WITH PROPOFOL  N/A 10/09/2016   Sigmoid and descending colon diverticulosis, four 4-6 mm polyps in sigmoid, one 4 mm polyp in descending. Tubular adenomas and hyperplastic. 5 year surveillance.    COLONOSCOPY WITH PROPOFOL  N/A 11/24/2016   Sigmoid and descending colon diverticulosis, four 4-6 mm polyps in sigmoid, one 4 mm polyp in descending. Tubular adenomas and hyperplastic. 5 year surveillance.    COLONOSCOPY WITH PROPOFOL  N/A 10/18/2021   Carver: nonbleeding internal hemorrhoids, small and large mouth diverticula found in the sigmoid, descending, transverse colon.  A 9 mm sessile polyp was found in the ascending colon that was removed.  4 sessile polyps found in the sigmoid, descending, transverse colon 3 to 5 mm in size, examining otherwise.  Path revealed tubular adenomas.  Repeat due in 5 years for surveillance.   CORONARY ANGIOPLASTY WITH STENT PLACEMENT  01/2015   CORONARY STENT INTERVENTION N/A 04/27/2017   Procedure: CORONARY STENT INTERVENTION;  Surgeon: Mady Bruckner, MD;  Location: [facility] INVASIVE CV LAB;  Service: Cardiovascular;  Laterality: N/A;   ELECTROPHYSIOLOGY STUDY N/A 06/29/2017   Procedure: ELECTROPHYSIOLOGY STUDY;  Surgeon: Waddell Danelle LELON, MD;  Location: [facility] INVASIVE CV LAB;  Service: Cardiovascular;  Laterality: N/A;   ESOPHAGOGASTRODUODENOSCOPY (EGD) WITH PROPOFOL  N/A 10/09/2016   Dr. Shaaron: LA grade a esophagitis.  Barrett's esophagus, biopsy-proven.  Small hiatal hernia.  EGD February 2019   ESOPHAGOGASTRODUODENOSCOPY (EGD) WITH PROPOFOL  N/A 11/28/2019    salmon-colored esophageal mucosa (Barrett's) small hiatal hernia, portal hypertensive gastropathy, normal duodenum, 3 year surveillance   ESOPHAGOGASTRODUODENOSCOPY (EGD)  WITH PROPOFOL  N/A 09/06/2021   three columns of grade 1 varices in distal esophagus, no stigmata of bleeding or red wale signs. Small hiatal hernia. Mild portal gastropathy. Normal duodenum. Repeat upper endoscopy in 1 year for surveillance.   ESOPHAGOGASTRODUODENOSCOPY (EGD) WITH PROPOFOL  N/A 06/15/2023   Procedure: ESOPHAGOGASTRODUODENOSCOPY (EGD) WITH PROPOFOL ;  Surgeon: Shaaron Lamar HERO, MD;  [facility];  Service: Endoscopy;  Laterality: N/A;  830am, asa 3   FIDUCIAL MARKER PLACEMENT  10/16/2022   Procedure: FIDUCIAL MARKER PLACEMENT;  Surgeon: Gladis Leonor HERO, MD;  Location: [facility] ENDOSCOPY;  Service: Pulmonary;;   INCISION / DRAINAGE HAND / FINGER     LEFT HEART CATH AND CORONARY ANGIOGRAPHY N/A 04/27/2017   Procedure: LEFT HEART CATH AND CORONARY ANGIOGRAPHY;  Surgeon: Mady Bruckner, MD;  Location: [facility] INVASIVE CV LAB;  Service: Cardiovascular;  Laterality: N/A;   LOOP RECORDER INSERTION  06/29/2017   Procedure: Loop Recorder Insertion;  Surgeon: Waddell Danelle LELON, MD;  Location:  [facility] INVASIVE CV LAB;  Service: Cardiovascular;;   POLYPECTOMY  11/24/2016   Procedure: POLYPECTOMY;  Surgeon: Lamar CHRISTELLA Hollingshead, MD;  Location: [facility];  Service: Endoscopy;;  descending and sigmoid   POLYPECTOMY  10/18/2021   Procedure: POLYPECTOMY;  Surgeon: Cindie Carlin POUR, DO;  Location: [facility];  Service: Endoscopy;;   TOTAL HIP ARTHROPLASTY Right 07/01/2016   TOTAL HIP ARTHROPLASTY Right 07/01/2016   Procedure: RIGHT TOTAL HIP ARTHROPLASTY ANTERIOR APPROACH;  Surgeon: Lonni CINDERELLA Poli, MD;  Location: [facility] OR;  Service: Orthopedics;  Laterality: Right;   ULTRASOUND GUIDANCE FOR VASCULAR ACCESS Left 03/24/2024   Procedure: ULTRASOUND GUIDANCE, FOR VASCULAR ACCESS;  Surgeon: Magda Debby SAILOR, MD;  Location: [facility] OR;  Service: Vascular;  Laterality: Left;   Patient Active Problem List   Diagnosis Date Noted   Pain of left upper extremity 04/24/2024   Acute postoperative anemia due to  greater than expected blood loss 03/26/2024   Obesity, class 1 03/26/2024   Aneurysm of axillary artery (HCC) 03/24/2024   Axillary artery injury 03/12/2024   Acute CVA (cerebrovascular accident) (HCC) 03/02/2024   Essential hypertension 03/02/2024   Dyslipidemia 03/02/2024   Type 2 diabetes mellitus with hyperlipidemia (HCC) 03/02/2024   Erectile dysfunction 02/24/2023   Chronic eczematous otitis externa of both ears 02/05/2023   Tobacco abuse 12/25/2022   Malignant neoplasm of upper lobe of right lung (HCC) 09/25/2022   Cervical spondylosis with myelopathy 08/01/2022   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   DDD (degenerative disc disease), cervical 05/21/2022   Osteoarthritis of both hands 05/19/2022   History of GI bleed 11/13/2021   Insomnia 09/13/2021   Cirrhosis of liver (HCC) 11/08/2018   Barrett's esophagus 08/04/2018   Vitamin D  deficiency 06/30/2018   Alcohol  abuse 06/30/2018   DM type 2 causing vascular disease (HCC) 08/17/2017   Chronic combined systolic and diastolic CHF (congestive heart failure) (HCC) 05/15/2017   OSA (obstructive sleep apnea) 04/28/2017   Depression with anxiety 04/15/2017   GERD (gastroesophageal reflux disease) 09/23/2016   Asthmatic bronchitis , chronic (HCC) 08/27/2015   Mixed hyperlipidemia 08/27/2015   Class 1 obesity due to excess calories with serious comorbidity and body mass index (BMI) of 32.0 to 32.9 in adult 08/27/2015   CAD (coronary artery disease) 05/31/2015    PCP: Cook, Jayce G, DO   REFERRING PROVIDER: Noralee Elidia Sieving*   REFERRING DIAG: left axillary pseudoaneurysm   Rationale for Evaluation and Treatment:  Rehabiliation  THERAPY DIAG:  Acute pain of left shoulder  Muscle weakness (generalized)  ONSET DATE: 03/11/24   SUBJECTIVE:  SUBJECTIVE STATEMENT: Pt states elbow is working but is so sore. His arm feels very heavy. Reports collarbone is broken but being in the sling hurt his forearm too much. Pt will be following up with [facility] to see a specialist for his hand/nerve damage. Pt reports continued difficulty with sleeping.   PERTINENT HISTORY:  past medical history of diabetes mellitus type 2, hypertension, CAD, liver cirrhosis, anxiety, depression, OSA, CVA w/ left-sided weakness who had a recent hospitalization 7/11 to 03/19/24 post traumatic left shoulder dislocation, complicated with left axillary hematoma with left axillary pseudoaneurysm. On 07/12 he underwent open exposure and repair of the left axillary pseudoaneurysm and evacuation of left axillary hematoma.   PAIN:  NPRS scale: 5/10 upon arrival Pain location:left shoulder/axillary, left forearm and wrist Pain description: constant, achy, sharp Aggravating factors: any use of this left arm. Relieving factors: rest, meds   PRECAUTIONS: ,  Shoulder  RED FLAGS: None   WEIGHT BEARING RESTRICTIONS:  He is unsure so PT will keep him NWB for his left UE for now.  FALLS:  Has patient fallen in last 6 months? Yes. Number of falls 1   OCCUPATION:  disabled  PLOF:  Independent with basic ADLs  PATIENT GOALS:  Reduce pain and get his arm back to normal  OBJECTIVE:  Note: Objective measures were completed at Evaluation unless otherwise noted.  DIAGNOSTIC FINDINGS:  Chest CT IMPRESSION: 1. Enlarging left axillary hematoma with arterial contrast extravasation from the posterior wall of the left axillary artery into a 1.5 x 1.8 cm pseudoaneurysm, with additional 1.8 x 1.1 cm pocket of contrast more posteriorly within the collection. 2. Increased heterogeneous hematoma in the left subscapularis region measuring 7.3 x 4.8 cm, but no focal contrast is seen within this. 3. Emphysema. 4. Right apical fiducial markers and post XRT changes, with  treated nodule just above the fiducial markers today measuring 8 mm, previously 9 mm. 5. Aortic and coronary artery atherosclerosis.  Right aortic arch. 6. Enlarged pulmonary trunk 3.3 cm indicating arterial hypertension. 7. Cirrhosis and splenomegaly.  Prominent patent portal vein 1.7 cm. 8. Critical Value/emergent results were called by telephone at the time of interpretation on 03/24/2024 at 5:18 am to provider [facility] , who verbally acknowledged these results.  Shoulder XR IMPRESSION: No acute bony abnormality.   Postoperative changes in the left axilla.  No imaging for left elbow/wrist  PATIENT SURVEYS:  Patient-Specific Activity Scoring Scheme  0 represents "unable to perform." 10 represents "able to perform at prior level. 0 1 2 3 4 5 6 7 8 9  10 (Date and Score)   Activity Eval     1. Raising left arm  0    2. Putting on and off shirt 1    3.     4.    5.    Score 0.5/10    Total score = sum of the activity scores/number of activities Minimum detectable change (90%CI) for average score = 2 points Minimum detectable change (90%CI) for single activity score = 3 points     EDEMA:  Yes: moderate edema noted in left upper extremity from shoulder to hand  Observation:  2 incisions noted, one at proximal chest, one at axilla, both incisions closed with stitches and appear closed without any drainage or signs of infection at the moment. Dressing was removed and changed for him at eval.     UPPER EXTREMITY ROM:  AROM/Passive ROM Left eval Left 04/13/24  Shoulder flexion /50 /120  Shoulder extension  Shoulder abduction /50 /100  Shoulder adduction    Shoulder extension    Shoulder internal rotation /50   Shoulder external rotation /40 /70  Elbow flexion    Elbow extension    Wrist flexion    Wrist extension    Wrist ulnar deviation    Wrist radial deviation    Wrist pronation    Wrist supination     (Blank rows = not tested)   UPPER EXTREMITY  MMT:  MMT Left eval   Shoulder flexion 1   Shoulder extension    Shoulder abduction 1   Shoulder adduction    Shoulder extension    Shoulder internal rotation 1   Shoulder external rotation 1   Middle trapezius    Lower trapezius    Elbow flexion 1   Elbow extension 2   Wrist flexion    Wrist extension    Wrist ulnar deviation    Wrist radial deviation    Wrist pronation    Wrist supination    Grip strength     (Blank rows = not tested)                                                                                                                              TREATMENT DATE:  05/09/24 Therex: Standing p ball roll outs on mat table for AAROM flexion, abduction, ER, X 15 reps each Sitting forearm sup/pron AROM x20 with MWM Sitting wrist flexion/ext AROM x20 Hand/finger flexion/ext AAROM/AROM x10 each Sitting shoulder ER AAROM on table x20 Seated shoulder AAROM flexion and scaption on table x20 Seated scapular retraction x20  Manual therapy: manual drainage massage fingers/hands gentle PROM fingers, thumb, hand, wrist STM & TPR wrist/hand extensors/flexors Gentle finger joint distraction and grade II to III PA mobs metacarpals  04/18/24 Therex: Standing p ball roll outs on mat table for AAROM flexion, abduction, ER, X 15 reps each Standing elbow AROM X 15 reps Standing posterior shoulders circles  including scapular retraction X 15 Standing pendulums X 15 circles CW, CCW, flexion-extension, and abduction-adduction Seated table slide walking fingers out into flexion X 10, into IR/ER X 5 reps Supine left elbow AROM X 10 reps Supine chest press AAROM on left X 15 reps with hands clasped Supine AAROM with hands clasped X 10 reps flexion Gripping X 20 Helped him with sling set up  Manual therapy: Left shoulder PROM in all planes   PATIENT EDUCATION: Education details: HEP, PT plan of care, selfcare Person educated: Patient Education method: Explanation,  Demonstration, Verbal cues, and Handouts Education comprehension: verbalized understanding, further education recommended   HOME EXERCISE PROGRAM: Access Code: [email or web address] URL: [email or web address] Date: 03/31/2024 Prepared by: Redell Moose  Exercises - Circular Shoulder Pendulum with Table Support  - 2-3 x daily - 7 x weekly - 1 sets - 15 reps - Seated Shoulder Abduction Towel Slide at Table Top  - 2-3 x daily - 6 x weekly - 1 sets -  10 reps - 5 sec hold - Seated Shoulder Flexion Towel Slide at Table Top  - 2-3 x daily - 6 x weekly - 1 sets - 10 reps - 5 sec hold - Seated Shoulder External Rotation PROM on Table  - 2-3 x daily - 6 x weekly - 1 sets - 10 reps - 5 sec hold - Seated Gripping Towel  - 2-3 x daily - 6 x weekly - 1 sets - 20 reps - Standing Elbow Flexion Extension AROM  - 2-3 x daily - 6 x weekly - 1 sets - 10 reps  ASSESSMENT:  CLINICAL IMPRESSION: No breaks in forearm/wrist. Started on gentle hand/wrist/forearm AROM for stretching, mobility and to decrease hand edema. Pt is very stiff with forearm pronation/supination. Continued shoulder AAROM. Grip remains limited. Educated on how to perform self massage and PROM for home.  OBJECTIVE IMPAIRMENTS: decreased activity tolerance for ADL's, difficulty walking, decreased balance, decreased endurance, decreased mobility, decreased ROM, decreased strength, impaired flexibility, impaired UE use, and pain.  ACTIVITY LIMITATIONS: bending, lifting, carry, reaching, locomotion, cleaning, community activity, driving,  PERSONAL FACTORS: see above PMH  also affecting patient's functional outcome.  REHAB POTENTIAL: Good  CLINICAL DECISION MAKING: Evolving/moderate complexity  EVALUATION COMPLEXITY: Moderate    GOALS: Short term PT Goals Target date: 04/28/2024   Pt will be I and compliant with HEP. Baseline:  Goal status: MET 04/18/24 Pt will decrease pain by 25% overall Baseline: Goal status: MET 04/18/24  Long  term PT goals Target date:06/23/2024   Pt will improve right shoulder and elbow AROM to [facility] to improve functional mobility Baseline: Goal status: ongoing 04/18/24 Pt will improve  right arm strength to at least 4+/5 MMT to improve functional strength Baseline: Goal status: ongoing 04/18/24 Pt will improve Patient specific functional scale (PSFS) to at least 7/10 to show improved function level Baseline: Goal status: ongoing 04/18/24 Pt will reduce pain to overall less than 3/10 with usual activity Baseline: Goal status: ongoing 04/18/24 PLAN: PT FREQUENCY: 1-3 times per week   PT DURATION: 6-8 weeks  PLANNED INTERVENTIONS (unless contraindicated):  97110-Therapeutic exercises, 97530- Therapeutic activity, 97112- Neuromuscular re-education, 97535- Self Care, [id number]- Manual therapy, [id number]- Electrical stimulation (unattended), [id number]- Vasopneumatic device, [id number]- Ultrasound, and 97033- Ionotophoresis 4mg /ml Dexamethasone   PLAN FOR NEXT SESSION: AAROM and PROM as tolerated, grip strength and arm strength has been difficult.    Aadvik Roker April Ma L Tag Wurtz, PT,DPT 05/09/2024, 7:55 AM

## 2024-05-11 ENCOUNTER — Ambulatory Visit: Admitting: Orthopaedic Surgery

## 2024-05-12 ENCOUNTER — Ambulatory Visit (HOSPITAL_COMMUNITY)
Admission: RE | Admit: 2024-05-12 | Discharge: 2024-05-12 | Disposition: A | Discharge status: Home / Self Care | Source: Ambulatory Visit | Attending: Radiation Oncology | Admitting: Radiation Oncology

## 2024-05-12 ENCOUNTER — Other Ambulatory Visit: Payer: Self-pay | Admitting: Family Medicine

## 2024-05-12 DIAGNOSIS — C3411 Malignant neoplasm of upper lobe, right bronchus or lung: Secondary | ICD-10-CM | POA: Insufficient documentation

## 2024-05-12 MED ORDER — IOHEXOL 300 MG/ML  SOLN
80.0000 mL | Freq: Once | INTRAMUSCULAR | Status: AC | PRN
  Administered 2024-05-12: 80 mL via INTRAVENOUS

## 2024-05-15 ENCOUNTER — Other Ambulatory Visit: Payer: Self-pay | Admitting: Family

## 2024-05-16 ENCOUNTER — Telehealth: Payer: Self-pay | Admitting: *Deleted

## 2024-05-16 ENCOUNTER — Ambulatory Visit
Admission: RE | Admit: 2024-05-16 | Discharge: 2024-05-16 | Disposition: A | Discharge status: Home / Self Care | Source: Ambulatory Visit | Attending: Radiation Oncology | Admitting: Radiation Oncology

## 2024-05-16 ENCOUNTER — Encounter: Payer: Self-pay | Admitting: Radiation Oncology

## 2024-05-16 DIAGNOSIS — C3411 Malignant neoplasm of upper lobe, right bronchus or lung: Secondary | ICD-10-CM

## 2024-05-16 NOTE — Progress Notes (Signed)
 Radiation Oncology         [phone] ________________________________   Outpatient Follow Up- Conducted via telephone at patient request.  I spoke with the patient to conduct this visit via telephone. The patient was notified in advance and was offered an in person or telemedicine meeting to allow for face to face communication but instead preferred to proceed with a telephone visit.  Name: Randy Hebert        MRN: [id number]  Date of Service: 05/16/2024 DOB: 08-14-1966  RR:Rnnx, Jayce G, DO  Cook, Jayce G, DO     REFERRING PHYSICIAN: Cook, Jayce G, DO   DIAGNOSIS: The encounter diagnosis was Malignant neoplasm of upper lobe of right lung (HCC).   HISTORY OF PRESENT ILLNESS: Randy Hebert is a 58 y.o. male with a history of Stage IA1, cT1aN0M0, NSCLC, squamous cell carcinoma of the RUL.  He was found to have a nodule in the right upper lobe on lung cancer screening scan performed in October 2023 measuring 10.3 mm. A PET scan on 07/17/2022 showed increased active activity in the apical segment of the right upper lobe and the visible nodule concerning for primary bronchogenic carcinoma.  No evidence of nodal or distant metastases were appreciated.  Bronchoscopy was performed on 10/16/2022 and fine-needle aspirate showed non-small cell lung cancer, consistent with squamous cell carcinoma additional lavage showed atypical cells.  He did have a post biopsy pneumothorax which improved without further complication.  He did meet with Dr. Kerrin and while he could consider surgery with cardiology clearance, the patient decided to forego surgical resection and rather proceeded with stereotactic body radiotherapy to the right upper lobe which he completed in March 2024.    His posttreatment surveillance has been reassuring. His last CT chest on 05/12/24 was resassuring with post treatment changes. No new nodules or adenopathy was present. He had evidence of atherosclerosis, and evidence of new  left axillary artery stent and improvement of a previously noted axillary hematoma. He had evidence of cirrhosis as well. He had a fall and multiple fractures and vascular injuries in July with a complicated recovery. He is s/p surgical repair of the left axillary and subsequent evacuation of a hematoma, and left axillary angioplasty with cannulation. He's contacted by phone   PREVIOUS RADIATION THERAPY:    11/14/22-11/21/22: SBRT Treatment Site/dose:   The tumor in the RUL was treated with a course of stereotactic body radiation treatment. The patient received 54 Gy In 3 fractions at 18 G per fraction   PAST MEDICAL HISTORY:  Past Medical History:  Diagnosis Date   ACE inhibitor-aggravated angioedema    Allergy     Anemia    Angio-edema    Anxiety    Arthritis    Asthma    hip replacement   Back pain    Bradycardia 04/28/2017   Bulging of cervical intervertebral disc    CAD (coronary artery disease)    lateral STEMI 02/06/2015 00% D1 occlusion treated with Promus Premier 2.5 mm x 16 mm DES, 70% ramus stenosis, 40% mid RCA stenosis, 45% distal RCA stenosis, EF 45-50%   CHF (congestive heart failure) (HCC)    COPD (chronic obstructive pulmonary disease) (HCC)    Depression    Diabetes mellitus without complication (HCC)    Difficult intubation    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   Dry eye    Dyspnea    Early satiety 09/23/2016   Fatty liver    GERD (gastroesophageal  reflux disease)    HCAP (healthcare-associated pneumonia) 05/15/2017   Headache    Heart murmur    Hip pain    Hyperlipidemia    Hypertension    Lobar pneumonia (HCC) 05/15/2017   Melena 08/04/2018   MI (myocardial infarction) [facility])    Myocardial infarction [facility])    Neck pain    Non-ST elevation (NSTEMI) myocardial infarction (HCC) 04/27/2017   NSTEMI (non-ST elevated myocardial infarction) (HCC) 04/26/2017   Otitis media    Pleurisy    Pneumonia due to COVID-19 virus 10/07/2019   Rectal  bleeding 11/08/2018   Right shoulder pain 03/20/2020   Sinus pause    9 sec sinus pause on telemetry after started on coreg  after MI, avoid AV nodal blocking agent   Sleep apnea    uses a cpap   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   STEMI (ST elevation myocardial infarction) (HCC) 05/31/2015   Substance abuse (HCC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   Syncope 06/29/2017   Syncope and collapse 05/15/2017   Transaminitis 08/04/2018   Unilateral primary osteoarthritis, right hip 07/01/2016       PAST SURGICAL HISTORY: Past Surgical History:  Procedure Laterality Date   ARTERY EXPLORATION Left 03/24/2024   Procedure: LEFT ARCH AORTOGRAM, LEFT UPPER EXTREMITY ANGIOGRAM WITH FIRST ORDER CANNULATION, LEFT AXILLARY ANGIOPLASTY AND STENTING, EVACUATION OF LEFT AXILLARY HEMATOMA;  Surgeon: Magda Debby SAILOR, MD;  Location: [facility] OR;  Service: Vascular;  Laterality: Left;   ARTERY REPAIR Left 03/11/2024   Procedure: REPAIR LEFT AXILLARY ARTERY;  Surgeon: Serene Gaile ORN, MD;  Location: [facility] OR;  Service: Vascular;  Laterality: Left;  ARTERIAL REPAIR, LEFT AXILLARY   BIOPSY  10/09/2016   Procedure: BIOPSY;  Surgeon: Lamar CHRISTELLA Hollingshead, MD;  Location: [facility] ENDO SUITE;  Service: Endoscopy;;   BIOPSY  11/28/2019   Procedure: BIOPSY;  Surgeon: Hollingshead Lamar CHRISTELLA, MD;  Location: [facility] ENDO [facility];  Service: Endoscopy;;   BIOPSY  06/15/2023   Procedure: BIOPSY;  Surgeon: Hollingshead Lamar CHRISTELLA, MD;  Location: [facility] END[facility];  Service: Endoscopy;;   BRONCHIAL BIOPSY  10/16/2022   Procedure: BRONCHIAL BIOPSIES;  Surgeon: Gladis Leonor CHRISTELLA, MD;  Location: [facility] ENDOSCOPY;  Service: Pulmonary;;   BRONCHIAL NEEDLE ASPIRATION BIOPSY  10/16/2022   Procedure: BRONCHIAL NEEDLE ASPIRATION BIOPSIES;  Surgeon: Gladis Leonor CHRISTELLA, MD;  Location: [facility] ENDOSCOPY;  Service: Pulmonary;;   BRONCHIAL WASHINGS  10/16/2022   Procedure: BRONCHIAL WASHINGS;  Surgeon: Gladis Leonor CHRISTELLA, MD;  Location: [facility] ENDOSCOPY;  Service: Pulmonary;;   CARDIAC  CATHETERIZATION N/A 02/06/2015   Procedure: Left Heart Cath and Coronary Angiography;  Surgeon: Alm ORN Clay, MD;  Location: [facility] INVASIVE CV LAB;  Service: Cardiovascular;  Laterality: N/A;   CARDIAC CATHETERIZATION N/A 02/06/2015   Procedure: Coronary Stent Intervention;  Surgeon: Alm ORN Clay, MD;  Location: [facility] INVASIVE CV LAB;  Service: Cardiovascular;  Laterality: N/A;   COLONOSCOPY WITH PROPOFOL  N/A 10/09/2016   Sigmoid and descending colon diverticulosis, four 4-6 mm polyps in sigmoid, one 4 mm polyp in descending. Tubular adenomas and hyperplastic. 5 year surveillance.    COLONOSCOPY WITH PROPOFOL  N/A 11/24/2016   Sigmoid and descending colon diverticulosis, four 4-6 mm polyps in sigmoid, one 4 mm polyp in descending. Tubular adenomas and hyperplastic. 5 year surveillance.    COLONOSCOPY WITH PROPOFOL  N/A 10/18/2021   Carver: nonbleeding internal hemorrhoids, small and large mouth diverticula found in the sigmoid, descending, transverse colon.  A 9 mm sessile polyp was found in the ascending colon  that was removed.  4 sessile polyps found in the sigmoid, descending, transverse colon 3 to 5 mm in size, examining otherwise.  Path revealed tubular adenomas.  Repeat due in 5 years for surveillance.   CORONARY ANGIOPLASTY WITH STENT PLACEMENT  01/2015   CORONARY STENT INTERVENTION N/A 04/27/2017   Procedure: CORONARY STENT INTERVENTION;  Surgeon: Mady Bruckner, MD;  [facility]: [facility] INVASIVE CV LAB;  Service: Cardiovascular;  Laterality: N/A;   ELECTROPHYSIOLOGY STUDY N/A 06/29/2017   Procedure: ELECTROPHYSIOLOGY STUDY;  Surgeon: Waddell Danelle ORN, MD;  Location: [facility] INVASIVE CV LAB;  Service: Cardiovascular;  Laterality: N/A;   ESOPHAGOGASTRODUODENOSCOPY (EGD) WITH PROPOFOL  N/A 10/09/2016   Dr. Shaaron: LA grade a esophagitis.  Barrett's esophagus, biopsy-proven.  Small hiatal hernia.  EGD February 2019   ESOPHAGOGASTRODUODENOSCOPY (EGD) WITH PROPOFOL  N/A 11/28/2019    salmon-colored esophageal  mucosa (Barrett's) small hiatal hernia, portal hypertensive gastropathy, normal duodenum, 3 year surveillance   ESOPHAGOGASTRODUODENOSCOPY (EGD) WITH PROPOFOL  N/A 09/06/2021   three columns of grade 1 varices in distal esophagus, no stigmata of bleeding or red wale signs. Small hiatal hernia. Mild portal gastropathy. Normal duodenum. Repeat upper endoscopy in 1 year for surveillance.   ESOPHAGOGASTRODUODENOSCOPY (EGD) WITH PROPOFOL  N/A 06/15/2023   Procedure: ESOPHAGOGASTRODUODENOSCOPY (EGD) WITH PROPOFOL ;  Surgeon: Shaaron Lamar HERO, MD;  [facility];  Service: Endoscopy;  Laterality: N/A;  830am, asa 3   FIDUCIAL MARKER PLACEMENT  10/16/2022   Procedure: FIDUCIAL MARKER PLACEMENT;  Surgeon: Gladis Leonor HERO, MD;  Location: [facility] ENDOSCOPY;  Service: Pulmonary;;   INCISION / DRAINAGE HAND / FINGER     LEFT HEART CATH AND CORONARY ANGIOGRAPHY N/A 04/27/2017   Procedure: LEFT HEART CATH AND CORONARY ANGIOGRAPHY;  Surgeon: Mady Bruckner, MD;  Location: [facility] INVASIVE CV LAB;  Service: Cardiovascular;  Laterality: N/A;   LOOP RECORDER INSERTION  06/29/2017   Procedure: Loop Recorder Insertion;  Surgeon: Waddell Danelle ORN, MD;  Location: [facility] INVASIVE CV LAB;  Service: Cardiovascular;;   POLYPECTOMY  11/24/2016   Procedure: POLYPECTOMY;  Surgeon: Lamar HERO Shaaron, MD;  [facility];  Service: Endoscopy;;  descending and sigmoid   POLYPECTOMY  10/18/2021   Procedure: POLYPECTOMY;  Surgeon: Cindie Carlin POUR, DO;  [facility] END[facility];  Service: Endoscopy;;   TOTAL HIP ARTHROPLASTY Right 07/01/2016   TOTAL HIP ARTHROPLASTY Right 07/01/2016   Procedure: RIGHT TOTAL HIP ARTHROPLASTY ANTERIOR APPROACH;  Surgeon: Bruckner CINDERELLA Poli, MD;  Location: [facility] OR;  Service: Orthopedics;  Laterality: Right;   ULTRASOUND GUIDANCE FOR VASCULAR ACCESS Left 03/24/2024   Procedure: ULTRASOUND GUIDANCE, FOR VASCULAR ACCESS;  Surgeon: Magda Debby SAILOR, MD;  Location: [facility] OR;  Service: Vascular;   Laterality: Left;     FAMILY HISTORY:  Family History  Problem Relation Age of Onset   Heart attack Father    Stroke Father    Arthritis Father    Heart disease Father    Cancer Mother        ???   Arthritis Mother    Heart disease Brother 43       died in sleep   Early death Brother    Diabetes Maternal Uncle    Alzheimer's disease Maternal Grandmother      SOCIAL HISTORY:  reports that he has been smoking cigarettes. He has a 46 pack-year smoking history. He has never been exposed to tobacco smoke. He has quit using smokeless tobacco.  His smokeless tobacco use included chew. He reports current alcohol  use. He reports that he does  not use drugs.  The patient is divorced and lives in [place]. His niece Isaiah is his next of kin. 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   ALLERGIES: Coreg  [carvedilol ], Zestril  [lisinopril ], Chantix  [varenicline ], and Amoxil  [amoxicillin ]   MEDICATIONS:  Current Outpatient Medications  Medication Sig Dispense Refill   acetaminophen  (TYLENOL ) 500 MG tablet Take 2 tablets (1,000 mg total) by mouth every 8 (eight) hours as needed for mild pain or fever.     amLODipine  (NORVASC ) 10 MG tablet Take 1 tablet (10 mg total) by mouth daily. 90 tablet 1   Ascorbic Acid  (VITAMIN C  PO) Take 1 tablet by mouth daily.     aspirin  EC 81 MG tablet Take 1 tablet (81 mg total) by mouth daily. Swallow whole. 30 tablet 12   atorvastatin  (LIPITOR ) 80 MG tablet Take 1 tablet (80 mg total) by mouth daily. 30 tablet 2   Cholecalciferol  (VITAMIN D -3 PO) Take 1 capsule by mouth daily.     citalopram  (CELEXA ) 20 MG tablet Take 1 tablet (20 mg total) by mouth daily. 90 tablet 1   clopidogrel  (PLAVIX ) 75 MG tablet Take 1 tablet (75 mg total) by mouth daily. 30 tablet 0   dapagliflozin  propanediol (FARXIGA ) 10 MG TABS tablet Take 1 tablet (10 mg total) by mouth daily. 30 tablet 0   Dulaglutide  (TRULICITY ) 1.5 MG/0.5ML  SOAJ Inject 1.5 mg into the skin once a week. 6 mL 0   ezetimibe  (ZETIA ) 10 MG tablet Take 1 tablet (10 mg total) by mouth daily. 90 tablet 1   Ferrous Sulfate  (IRON PO) Take 1 tablet by mouth daily.     fluconazole  (DIFLUCAN ) 150 MG tablet Take 1 tablet (150 mg total) by mouth daily. 5 tablet 0   furosemide  (LASIX ) 40 MG tablet Take 40 mg by mouth daily as needed (excessive swelling).     MAGNESIUM  PO Take 1 tablet by mouth 2 (two) times daily.     metFORMIN  (GLUCOPHAGE ) 500 MG tablet Take 1 tablet (500 mg total) by mouth 2 (two) times daily with a meal. 60 tablet 0   naproxen  (NAPROSYN ) 375 MG tablet Take 1 tablet (375 mg total) by mouth 2 (two) times daily as needed for moderate pain (pain score 4-6). 20 tablet 0   nitroGLYCERIN  (NITROSTAT ) 0.4 MG SL tablet Place 1 tablet (0.4 mg total) under the tongue every 5 (five) minutes x 3 doses as needed (if no relief after 3rd dose, proceed to ED or call 911). 25 tablet 3   ondansetron  (ZOFRAN -ODT) 4 MG disintegrating tablet Take 1 tablet (4 mg total) by mouth every 8 (eight) hours as needed for nausea or vomiting. 40 tablet 1   oxyCODONE -acetaminophen  (PERCOCET/ROXICET) 5-325 MG tablet Take 1 tablet by mouth every 4 (four) hours as needed for severe pain (pain score 7-10). 15 tablet 0   pantoprazole  (PROTONIX ) 40 MG tablet TAKE (1) TABLET BY MOUTH TWICE DAILY BEFORE A MEAL. 180 tablet 1   RESTASIS  0.05 % ophthalmic emulsion Place 1 drop into both eyes 2 (two) times daily.     SYMBICORT  160-4.5 MCG/ACT inhaler INHALE 2 PUFFS INTO THE LUNGS FIRST THING IN THE MORNING AND 2 PUFFS ABOUT 12 HOURS LATER. 10.2 g 12   traMADol  (ULTRAM ) 50 MG tablet Take 1-2 tablets (50-100 mg total) by mouth every 8 (eight) hours as needed. 30 tablet 0   No current facility-administered medications for this encounter.     REVIEW OF SYSTEMS:  On review of systems, the patient reports that he is doing okay after his recent fall and hospitalization. He describes pain still in  his hand as well as difficulty with motor function. He is hopeful this will improve with time. He denies any shortness of breath, fevers, chills. He has occasional productive cough with clear sputum. He is happy to have lost 25 pounds from his recent hospitalization, but states he is hopeful to also get his appetite back. No other complaints are verbalized.   PHYSICAL EXAM:  Unable to assess due to encounter type.    ECOG = 1  0 - Asymptomatic (Fully active, able to carry on all predisease activities without restriction)  1 - Symptomatic but completely ambulatory (Restricted in physically strenuous activity but ambulatory and able to carry out work of a light or sedentary nature. For example, light housework, office work)  2 - Symptomatic, <50% in bed during the day (Ambulatory and capable of all self care but unable to carry out any work activities. Up and about more than 50% of waking hours)  3 - Symptomatic, >50% in bed, but not bedbound (Capable of only limited self-care, confined to bed or chair 50% or more of waking hours)  4 - Bedbound (Completely disabled. Cannot carry on any self-care. Totally confined to bed or chair)  5 - Death   Raylene MM, Creech RH, Tormey DC, et al. [phone]). Toxicity and response criteria of the [facility]. Am. DOROTHA Bridges. Oncol. 5 (6): 649-55    LABORATORY DATA:  Lab Results  Component Value Date   WBC 7.3 04/21/2024   HGB 15.1 04/21/2024   HCT 47.5 04/21/2024   MCV 91.5 04/21/2024   PLT 255 04/21/2024   Lab Results  Component Value Date   NA 136 04/21/2024   K 3.7 04/21/2024   CL 100 04/21/2024   CO2 24 04/21/2024   Lab Results  Component Value Date   ALT 38 03/24/2024   AST 47 (H) 03/24/2024   ALKPHOS 210 (H) 03/24/2024   BILITOT 3.0 (H) 03/24/2024      RADIOGRAPHY: CT CHEST W CONTRAST Result Date: 05/13/2024 CLINICAL DATA:  Non-small cell lung cancer. Restaging. * Tracking Code: BO * EXAM: CT CHEST WITH CONTRAST  TECHNIQUE: Multidetector CT imaging of the chest was performed during intravenous contrast administration. RADIATION DOSE REDUCTION: This exam was performed according to the departmental dose-optimization program which includes automated exposure control, adjustment of the mA and/or kV according to patient size and/or use of iterative reconstruction technique. CONTRAST:  80mL OMNIPAQUE  IOHEXOL  300 MG/ML  SOLN COMPARISON:  Multiple prior imaging studies. The most recent CT scan is 02/23/2024. FINDINGS: Cardiovascular: The heart is normal in size. No pericardial effusion. Right-sided aortic arch with moderate atherosclerotic calcifications for age. No dissection. Extensive age advanced three-vessel coronary artery calcifications again noted. Mild enlargement of the pulmonary arterial trunk which could suggest pulmonary hypertension. Mediastinum/Nodes: No mediastinal or hilar mass or lymphadenopathy. The esophagus is grossly normal. Lungs/Pleura: Stable underlying emphysematous changes and pulmonary scarring. Stable radiation changes in the right upper lobe. Stable minimal residual nodular density near the fiducials measuring 4 mm. No findings for recurrent disease. No new pulmonary lesions or pulmonary nodules. No acute pulmonary findings. Upper Abdomen: Stable advanced cirrhotic changes involving the liver without obvious hepatic lesion. Stable associated splenomegaly. Portal venous collaterals noted. Small scattered upper abdominal lymph nodes typical with cirrhosis. Musculoskeletal: No significant bony findings. Stable age advanced degenerative changes involving the lower cervical and thoracic  spines. Remote healed left rib fractures. New left axillary artery stent without complicating features. The large axillary hematoma seen on the prior chest CT is significantly smaller. IMPRESSION: 1. Stable radiation changes in the right upper lobe. No findings for recurrent disease. 2. No mediastinal or hilar mass or  adenopathy. No new pulmonary lesions or nodules. 3. Stable advanced cirrhotic changes involving the liver with associated splenomegaly and portal venous collaterals. 4. New left axillary artery stent without complicating features. The large axillary hematoma seen on the prior chest CT is significantly smaller. Aortic Atherosclerosis (ICD10-I70.0) and Emphysema (ICD10-J43.9). Electronically Signed   By: MYRTIS Stammer M.D.   On: 05/13/2024 14:08   DG Forearm Left Result Date: 04/21/2024 CLINICAL DATA:  Left upper extremity pain. EXAM: LEFT FOREARM - 2 VIEW; LEFT HAND - COMPLETE 3+ VIEW; LEFT SHOULDER - 2+ VIEW COMPARISON:  None Available. FINDINGS: There is no acute fracture or dislocation. The bones are osteopenic. There is degenerative changes of the left shoulder with spurring along the medial and lateral humeral head. Mild arthritic changes of the distal interphalangeal joints of the hand. There is soft tissue swelling of the hand. Partially visualized catheter in the left chest wall. Coronary vascular stent and loop recorder device noted. IMPRESSION: 1. No acute fracture or dislocation. 2. Osteopenia and degenerative changes. Electronically Signed   By: Vanetta Chou M.D.   On: 04/21/2024 12:02   DG Hand Complete Left Result Date: 04/21/2024 CLINICAL DATA:  Left upper extremity pain. EXAM: LEFT FOREARM - 2 VIEW; LEFT HAND - COMPLETE 3+ VIEW; LEFT SHOULDER - 2+ VIEW COMPARISON:  None Available. FINDINGS: There is no acute fracture or dislocation. The bones are osteopenic. There is degenerative changes of the left shoulder with spurring along the medial and lateral humeral head. Mild arthritic changes of the distal interphalangeal joints of the hand. There is soft tissue swelling of the hand. Partially visualized catheter in the left chest wall. Coronary vascular stent and loop recorder device noted. IMPRESSION: 1. No acute fracture or dislocation. 2. Osteopenia and degenerative changes. Electronically  Signed   By: Vanetta Chou M.D.   On: 04/21/2024 12:02   DG Shoulder Left Result Date: 04/21/2024 CLINICAL DATA:  Left upper extremity pain. EXAM: LEFT FOREARM - 2 VIEW; LEFT HAND - COMPLETE 3+ VIEW; LEFT SHOULDER - 2+ VIEW COMPARISON:  None Available. FINDINGS: There is no acute fracture or dislocation. The bones are osteopenic. There is degenerative changes of the left shoulder with spurring along the medial and lateral humeral head. Mild arthritic changes of the distal interphalangeal joints of the hand. There is soft tissue swelling of the hand. Partially visualized catheter in the left chest wall. Coronary vascular stent and loop recorder device noted. IMPRESSION: 1. No acute fracture or dislocation. 2. Osteopenia and degenerative changes. Electronically Signed   By: Vanetta Chou M.D.   On: 04/21/2024 12:02       IMPRESSION/PLAN: 1. Stage IA1, cT1aN0M0, NSCLC, squamous cell carcinoma of the RUL. The patient's recent CT has been reviewed and we will plan for repeat imaging in 6 months time. He is in agreement with this plan. 2. Recent fall, fractures, and arterial injuries. He is following up with orthopedics and a hand surgeon as well at [facility]. We will follow his recovery expectantly. 3. Loop recorder. We will follow up with cardiology regarding this device if needed for any role of future radiation.  This encounter was conducted via telephone.  The patient has provided two factor identification  and has given verbal consent for this type of encounter and has been advised to only accept a meeting of this type in a secure network environment. The time spent during this encounter was 35 minutes including preparation, discussion, and coordination of the patient's care. The attendants for this meeting include Sharene Cary, RN, Donald Estefana Husband  and Elsie CHRISTELLA Finder.  During the encounter,  Sharene Cary, RN, and Donald Estefana Husband were located at [facility]  Radiation Oncology Department.  Elsie CHRISTELLA Finder was located at home.      Donald KYM Husband, [facility]   **Disclaimer: This note was dictated with voice recognition software. Similar sounding words can inadvertently be transcribed and this note may contain transcription errors which may not have been corrected upon publication of note.**

## 2024-05-16 NOTE — Progress Notes (Signed)
 Patient had a fall back in July that has left him with pain in his hand and he has emergent surgery for artery repair.  He has been doing well since completion of his radiation treatments March 2024.  Denies SOB, and has occasional productive cough with clear sputum, continues to smoke.  I let him know that the PA would give him a call to discuss his CT Chest from 05/12/2024.  He verbalized understanding and was appreciative of the call.  Sharene EDISON RN, BSN

## 2024-05-16 NOTE — Telephone Encounter (Signed)
 Copied from CRM [id number]. Topic: Clinical - Medication Question >> May 16, 2024 11:04 AM Leonette SQUIBB wrote: Reason for CRM: pt will be calling to make an appt to go over his medications soon.  He has several medications that he was given in the hospital and will be needing refills for.

## 2024-05-17 LAB — POCT I-STAT CREATININE: Creatinine, Ser: 0.7 mg/dL (ref 0.61–1.24)

## 2024-05-18 ENCOUNTER — Encounter: Payer: Self-pay | Admitting: Physical Therapy

## 2024-05-18 ENCOUNTER — Ambulatory Visit: Admitting: Physical Therapy

## 2024-05-18 DIAGNOSIS — M25512 Pain in left shoulder: Secondary | ICD-10-CM | POA: Diagnosis not present

## 2024-05-18 DIAGNOSIS — M6281 Muscle weakness (generalized): Secondary | ICD-10-CM

## 2024-05-18 NOTE — Therapy (Signed)
 OUTPATIENT PHYSICAL THERAPY TREATMENT   Patient Name: Randy Hebert MRN: [id number] DOB:12-17-1965, 58 y.o., male Today's Date: 05/18/2024  END OF SESSION:  PT End of Session - 05/18/24 0933     Visit Number 6    Number of Visits 20    Date for PT Re-Evaluation 06/23/24    Progress Note Due on Visit 10    PT Start Time 0929    PT Stop Time 1010    PT Time Calculation (min) 41 min    Activity Tolerance Patient tolerated treatment well    Behavior During Therapy [facility] for tasks assessed/performed             Past Medical History:  Diagnosis Date   ACE inhibitor-aggravated angioedema    Allergy     Anemia    Angio-edema    Anxiety    Arthritis    Asthma    hip replacement   Back pain    Bradycardia 04/28/2017   Bulging of cervical intervertebral disc    CAD (coronary artery disease)    lateral STEMI 02/06/2015 00% D1 occlusion treated with Promus Premier 2.5 mm x 16 mm DES, 70% ramus stenosis, 40% mid RCA stenosis, 45% distal RCA stenosis, EF 45-50%   CHF (congestive heart failure) (HCC)    COPD (chronic obstructive pulmonary disease) (HCC)    Depression    Diabetes mellitus without complication (HCC)    Difficult intubation    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   Dry eye    Dyspnea    Early satiety 09/23/2016   Fatty liver    GERD (gastroesophageal reflux disease)    HCAP (healthcare-associated pneumonia) 05/15/2017   Headache    Heart murmur    Hip pain    Hyperlipidemia    Hypertension    Lobar pneumonia (HCC) 05/15/2017   Melena 08/04/2018   MI (myocardial infarction) (HCC)    Myocardial infarction (HCC)    Neck pain    Non-ST elevation (NSTEMI) myocardial infarction (HCC) 04/27/2017   NSTEMI (non-ST elevated myocardial infarction) (HCC) 04/26/2017   Otitis media    Pleurisy    Pneumonia due to COVID-19 virus 10/07/2019   Rectal bleeding 11/08/2018   Right shoulder pain 03/20/2020   Sinus pause    9 sec sinus pause on telemetry  after started on coreg  after MI, avoid AV nodal blocking agent   Sleep apnea    uses a cpap   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   STEMI (ST elevation myocardial infarction) (HCC) 05/31/2015   Substance abuse (HCC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   Syncope 06/29/2017   Syncope and collapse 05/15/2017   Transaminitis 08/04/2018   Unilateral primary osteoarthritis, right hip 07/01/2016   Past Surgical History:  Procedure Laterality Date   ARTERY EXPLORATION Left 03/24/2024   Procedure: LEFT ARCH AORTOGRAM, LEFT UPPER EXTREMITY ANGIOGRAM WITH FIRST ORDER CANNULATION, LEFT AXILLARY ANGIOPLASTY AND STENTING, EVACUATION OF LEFT AXILLARY HEMATOMA;  Surgeon: Magda Debby SAILOR, MD;  Location: [facility] OR;  Service: Vascular;  Laterality: Left;   ARTERY REPAIR Left 03/11/2024   Procedure: REPAIR LEFT AXILLARY ARTERY;  Surgeon: Serene Gaile ORN, MD;  Location: [facility] OR;  Service: Vascular;  Laterality: Left;  ARTERIAL REPAIR, LEFT AXILLARY   BIOPSY  10/09/2016   Procedure: BIOPSY;  Surgeon: Lamar CHRISTELLA Hollingshead, MD;  Location: [facility] END[facility];  Service: Endoscopy;;   BIOPSY  11/28/2019   Procedure: BIOPSY;  Surgeon: Hollingshead Lamar CHRISTELLA, MD;  Location: [facility];  Service: Endoscopy;;   BIOPSY  06/15/2023   Procedure: BIOPSY;  Surgeon: Shaaron Lamar HERO, MD;  Location: [facility];  Service: Endoscopy;;   BRONCHIAL BIOPSY  10/16/2022   Procedure: BRONCHIAL BIOPSIES;  Surgeon: Gladis Leonor HERO, MD;  Location: [facility] ENDOSCOPY;  Service: Pulmonary;;   BRONCHIAL NEEDLE ASPIRATION BIOPSY  10/16/2022   Procedure: BRONCHIAL NEEDLE ASPIRATION BIOPSIES;  Surgeon: Gladis Leonor HERO, MD;  Location: [facility] ENDOSCOPY;  Service: Pulmonary;;   BRONCHIAL WASHINGS  10/16/2022   Procedure: BRONCHIAL WASHINGS;  Surgeon: Gladis Leonor HERO, MD;  Location: [facility] ENDOSCOPY;  Service: Pulmonary;;   CARDIAC CATHETERIZATION N/A 02/06/2015   Procedure: Left Heart Cath and Coronary Angiography;  Surgeon: Alm LELON Clay, MD;  Location: [facility] INVASIVE CV  LAB;  Service: Cardiovascular;  Laterality: N/A;   CARDIAC CATHETERIZATION N/A 02/06/2015   Procedure: Coronary Stent Intervention;  Surgeon: Alm LELON Clay, MD;  Location: [facility] INVASIVE CV LAB;  Service: Cardiovascular;  Laterality: N/A;   COLONOSCOPY WITH PROPOFOL  N/A 10/09/2016   Sigmoid and descending colon diverticulosis, four 4-6 mm polyps in sigmoid, one 4 mm polyp in descending. Tubular adenomas and hyperplastic. 5 year surveillance.    COLONOSCOPY WITH PROPOFOL  N/A 11/24/2016   Sigmoid and descending colon diverticulosis, four 4-6 mm polyps in sigmoid, one 4 mm polyp in descending. Tubular adenomas and hyperplastic. 5 year surveillance.    COLONOSCOPY WITH PROPOFOL  N/A 10/18/2021   Carver: nonbleeding internal hemorrhoids, small and large mouth diverticula found in the sigmoid, descending, transverse colon.  A 9 mm sessile polyp was found in the ascending colon that was removed.  4 sessile polyps found in the sigmoid, descending, transverse colon 3 to 5 mm in size, examining otherwise.  Path revealed tubular adenomas.  Repeat due in 5 years for surveillance.   CORONARY ANGIOPLASTY WITH STENT PLACEMENT  01/2015   CORONARY STENT INTERVENTION N/A 04/27/2017   Procedure: CORONARY STENT INTERVENTION;  Surgeon: Mady Bruckner, MD;  Location: [facility] INVASIVE CV LAB;  Service: Cardiovascular;  Laterality: N/A;   ELECTROPHYSIOLOGY STUDY N/A 06/29/2017   Procedure: ELECTROPHYSIOLOGY STUDY;  Surgeon: Waddell Danelle LELON, MD;  Location: [facility] INVASIVE CV LAB;  Service: Cardiovascular;  Laterality: N/A;   ESOPHAGOGASTRODUODENOSCOPY (EGD) WITH PROPOFOL  N/A 10/09/2016   Dr. Shaaron: LA grade a esophagitis.  Barrett's esophagus, biopsy-proven.  Small hiatal hernia.  EGD February 2019   ESOPHAGOGASTRODUODENOSCOPY (EGD) WITH PROPOFOL  N/A 11/28/2019    salmon-colored esophageal mucosa (Barrett's) small hiatal hernia, portal hypertensive gastropathy, normal duodenum, 3 year surveillance   ESOPHAGOGASTRODUODENOSCOPY (EGD)  WITH PROPOFOL  N/A 09/06/2021   three columns of grade 1 varices in distal esophagus, no stigmata of bleeding or red wale signs. Small hiatal hernia. Mild portal gastropathy. Normal duodenum. Repeat upper endoscopy in 1 year for surveillance.   ESOPHAGOGASTRODUODENOSCOPY (EGD) WITH PROPOFOL  N/A 06/15/2023   Procedure: ESOPHAGOGASTRODUODENOSCOPY (EGD) WITH PROPOFOL ;  Surgeon: Shaaron Lamar HERO, MD;  [facility];  Service: Endoscopy;  Laterality: N/A;  830am, asa 3   FIDUCIAL MARKER PLACEMENT  10/16/2022   Procedure: FIDUCIAL MARKER PLACEMENT;  Surgeon: Gladis Leonor HERO, MD;  Location: [facility] ENDOSCOPY;  Service: Pulmonary;;   INCISION / DRAINAGE HAND / FINGER     LEFT HEART CATH AND CORONARY ANGIOGRAPHY N/A 04/27/2017   Procedure: LEFT HEART CATH AND CORONARY ANGIOGRAPHY;  Surgeon: Mady Bruckner, MD;  Location: [facility] INVASIVE CV LAB;  Service: Cardiovascular;  Laterality: N/A;   LOOP RECORDER INSERTION  06/29/2017   Procedure: Loop Recorder Insertion;  Surgeon: Waddell Danelle LELON, MD;  Location: [facility] INVASIVE CV LAB;  Service: Cardiovascular;;   POLYPECTOMY  11/24/2016   Procedure: POLYPECTOMY;  Surgeon: Lamar CHRISTELLA Hollingshead, MD;  [facility];  Service: Endoscopy;;  descending and sigmoid   POLYPECTOMY  10/18/2021   Procedure: POLYPECTOMY;  Surgeon: Cindie Carlin POUR, DO;  Location: [facility];  Service: Endoscopy;;   TOTAL HIP ARTHROPLASTY Right 07/01/2016   TOTAL HIP ARTHROPLASTY Right 07/01/2016   Procedure: RIGHT TOTAL HIP ARTHROPLASTY ANTERIOR APPROACH;  Surgeon: Lonni CINDERELLA Poli, MD;  Location: [facility] OR;  Service: Orthopedics;  Laterality: Right;   ULTRASOUND GUIDANCE FOR VASCULAR ACCESS Left 03/24/2024   Procedure: ULTRASOUND GUIDANCE, FOR VASCULAR ACCESS;  Surgeon: Magda Debby SAILOR, MD;  Location: [facility] OR;  Service: Vascular;  Laterality: Left;   Patient Active Problem List   Diagnosis Date Noted   Pain of left upper extremity 04/24/2024   Acute postoperative anemia due to  greater than expected blood loss 03/26/2024   Obesity, class 1 03/26/2024   Aneurysm of axillary artery (HCC) 03/24/2024   Axillary artery injury 03/12/2024   Acute CVA (cerebrovascular accident) (HCC) 03/02/2024   Essential hypertension 03/02/2024   Dyslipidemia 03/02/2024   Type 2 diabetes mellitus with hyperlipidemia (HCC) 03/02/2024   Erectile dysfunction 02/24/2023   Chronic eczematous otitis externa of both ears 02/05/2023   Tobacco abuse 12/25/2022   Malignant neoplasm of upper lobe of right lung (HCC) 09/25/2022   Cervical spondylosis with myelopathy 08/01/2022   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   DDD (degenerative disc disease), cervical 05/21/2022   Osteoarthritis of both hands 05/19/2022   History of GI bleed 11/13/2021   Insomnia 09/13/2021   Cirrhosis of liver (HCC) 11/08/2018   Barrett's esophagus 08/04/2018   Vitamin D  deficiency 06/30/2018   Alcohol  abuse 06/30/2018   DM type 2 causing vascular disease (HCC) 08/17/2017   Chronic combined systolic and diastolic CHF (congestive heart failure) (HCC) 05/15/2017   OSA (obstructive sleep apnea) 04/28/2017   Depression with anxiety 04/15/2017   GERD (gastroesophageal reflux disease) 09/23/2016   Asthmatic bronchitis , chronic (HCC) 08/27/2015   Mixed hyperlipidemia 08/27/2015   Class 1 obesity due to excess calories with serious comorbidity and body mass index (BMI) of 32.0 to 32.9 in adult 08/27/2015   CAD (coronary artery disease) 05/31/2015    PCP: Cook, Jayce G, DO   REFERRING PROVIDER: Noralee Elidia Sieving*   REFERRING DIAG: left axillary pseudoaneurysm   Rationale for Evaluation and Treatment:  Rehabiliation  THERAPY DIAG:  Acute pain of left shoulder  Muscle weakness (generalized)  ONSET DATE: 03/11/24   SUBJECTIVE:  SUBJECTIVE STATEMENT: Pt reports no change in his hand swelling. Has been trying to move joints in his fingers and massaging.   PERTINENT HISTORY:  past medical history of diabetes mellitus type 2, hypertension, CAD, liver cirrhosis, anxiety, depression, OSA, CVA w/ left-sided weakness who had a recent hospitalization 7/11 to 03/19/24 post traumatic left shoulder dislocation, complicated with left axillary hematoma with left axillary pseudoaneurysm. On 07/12 he underwent open exposure and repair of the left axillary pseudoaneurysm and evacuation of left axillary hematoma.   PAIN:  NPRS scale: 5/10 upon arrival Pain location:left shoulder/axillary, left forearm and wrist Pain description: constant, achy, sharp Aggravating factors: any use of this left arm. Relieving factors: rest, meds   PRECAUTIONS: ,  Shoulder  RED FLAGS: None   WEIGHT BEARING RESTRICTIONS:  He is unsure so PT will keep him NWB for his left UE for now.  FALLS:  Has patient fallen in last 6 months? Yes. Number of falls 1   OCCUPATION:  disabled  PLOF:  Independent with basic ADLs  PATIENT GOALS:  Reduce pain and get his arm back to normal  OBJECTIVE:  Note: Objective measures were completed at Evaluation unless otherwise noted.  DIAGNOSTIC FINDINGS:  Chest CT IMPRESSION: 1. Enlarging left axillary hematoma with arterial contrast extravasation from the posterior wall of the left axillary artery into a 1.5 x 1.8 cm pseudoaneurysm, with additional 1.8 x 1.1 cm pocket of contrast more posteriorly within the collection. 2. Increased heterogeneous hematoma in the left subscapularis region measuring 7.3 x 4.8 cm, but no focal contrast is seen within this. 3. Emphysema. 4. Right apical fiducial markers and post XRT changes, with treated nodule just above the fiducial markers today measuring 8 mm, previously 9 mm. 5. Aortic and coronary artery atherosclerosis.  Right aortic arch. 6. Enlarged pulmonary  trunk 3.3 cm indicating arterial hypertension. 7. Cirrhosis and splenomegaly.  Prominent patent portal vein 1.7 cm. 8. Critical Value/emergent results were called by telephone at the time of interpretation on 03/24/2024 at 5:18 am to provider [facility] , who verbally acknowledged these results.  Shoulder XR IMPRESSION: No acute bony abnormality.   Postoperative changes in the left axilla.  No imaging for left elbow/wrist  PATIENT SURVEYS:  Patient-Specific Activity Scoring Scheme  0 represents "unable to perform." 10 represents "able to perform at prior level. 0 1 2 3 4 5 6 7 8 9  10 (Date and Score)   Activity Eval     1. Raising left arm  0    2. Putting on and off shirt 1    3.     4.    5.    Score 0.5/10    Total score = sum of the activity scores/number of activities Minimum detectable change (90%CI) for average score = 2 points Minimum detectable change (90%CI) for single activity score = 3 points     EDEMA:  Yes: moderate edema noted in left upper extremity from shoulder to hand  Observation:  2 incisions noted, one at proximal chest, one at axilla, both incisions closed with stitches and appear closed without any drainage or signs of infection at the moment. Dressing was removed and changed for him at eval.     UPPER EXTREMITY ROM:  AROM/Passive ROM Left eval Left 04/13/24  Shoulder flexion /50 /120  Shoulder extension    Shoulder abduction /50 /100  Shoulder adduction    Shoulder extension    Shoulder internal rotation /50   Shoulder external rotation /40 /70  Elbow flexion    Elbow extension    Wrist flexion    Wrist extension    Wrist ulnar deviation    Wrist radial deviation    Wrist pronation    Wrist supination     (Blank rows = not tested)   UPPER EXTREMITY MMT:  MMT Left eval   Shoulder flexion 1   Shoulder extension    Shoulder abduction 1   Shoulder adduction    Shoulder extension    Shoulder internal rotation 1    Shoulder external rotation 1   Middle trapezius    Lower trapezius    Elbow flexion 1   Elbow extension 2   Wrist flexion    Wrist extension    Wrist ulnar deviation    Wrist radial deviation    Wrist pronation    Wrist supination    Grip strength     (Blank rows = not tested)                                                                                                                              TREATMENT DATE:  05/18/24 Therex: Standing p ball roll outs on mat table for AAROM flexion, abduction, circles CW & CCW X 15 reps each Supine shoulder flexion eccentrics with PT assist x5 Supine shoulder ER yellow TB 2x10 Supine shoulder IR yellow TB 2x10 Supine shoulder flex/ext yellow TB eccentrics 2x10 Supine wrist flex/ext AROM x20 Supine finger adduction/abduction x20 Standing table push up x10  Manual therapy: manual drainage massage fingers/hands gentle PROM fingers, thumb, hand, wrist STM & TPR wrist/hand extensors/flexors Gentle finger joint PROM  05/09/24 Therex: Standing p ball roll outs on mat table for AAROM flexion, abduction, ER, X 15 reps each Sitting forearm sup/pron AROM x20 with MWM Sitting wrist flexion/ext AROM x20 Hand/finger flexion/ext AAROM/AROM x10 each Sitting shoulder ER AAROM on table x20 Seated shoulder AAROM flexion and scaption on table x20 Seated scapular retraction x20  Manual therapy: manual drainage massage fingers/hands gentle PROM fingers, thumb, hand, wrist STM & TPR wrist/hand extensors/flexors Gentle finger joint distraction and grade II to III PA mobs metacarpals  04/18/24 Therex: Standing p ball roll outs on mat table for AAROM flexion, abduction, ER, X 15 reps each Standing elbow AROM X 15 reps Standing posterior shoulders circles  including scapular retraction X 15 Standing pendulums X 15 circles CW, CCW, flexion-extension, and abduction-adduction Seated table slide walking fingers out into flexion X 10, into IR/ER X 5  reps Supine left elbow AROM X 10 reps Supine chest press AAROM on left X 15 reps with hands clasped Supine AAROM with hands clasped X 10 reps flexion Gripping X 20 Helped him with sling set up  Manual therapy: Left shoulder PROM in all planes   PATIENT EDUCATION: Education details: HEP, PT plan of care, selfcare Person educated: Patient Education method: Explanation, Demonstration, Verbal cues, and Handouts Education comprehension: verbalized understanding, further education recommended  HOME EXERCISE PROGRAM: Access Code: [email or web address] URL: [email or web address] Date: 03/31/2024 Prepared by: Redell Moose  Exercises - Circular Shoulder Pendulum with Table Support  - 2-3 x daily - 7 x weekly - 1 sets - 15 reps - Seated Shoulder Abduction Towel Slide at Table Top  - 2-3 x daily - 6 x weekly - 1 sets - 10 reps - 5 sec hold - Seated Shoulder Flexion Towel Slide at Table Top  - 2-3 x daily - 6 x weekly - 1 sets - 10 reps - 5 sec hold - Seated Shoulder External Rotation PROM on Table  - 2-3 x daily - 6 x weekly - 1 sets - 10 reps - 5 sec hold - Seated Gripping Towel  - 2-3 x daily - 6 x weekly - 1 sets - 20 reps - Standing Elbow Flexion Extension AROM  - 2-3 x daily - 6 x weekly - 1 sets - 10 reps  ASSESSMENT:  CLINICAL IMPRESSION: L hand remains edematous. Has been compliant with self massage. Some increased 3rd digit flexion noted today that is more since last session (was only able to slightly flex 4th and 5th digit last treatment). Increasing L shoulder strength. Working on Actuary today to try and further increasing mobility. Able to obtain shoulder flexion AROM to 90 deg one time this session without assist.   OBJECTIVE IMPAIRMENTS: decreased activity tolerance for ADL's, difficulty walking, decreased balance, decreased endurance, decreased mobility, decreased ROM, decreased strength, impaired flexibility, impaired UE use, and pain.  ACTIVITY LIMITATIONS: bending,  lifting, carry, reaching, locomotion, cleaning, community activity, driving,  PERSONAL FACTORS: see above PMH  also affecting patient's functional outcome.  REHAB POTENTIAL: Good  CLINICAL DECISION MAKING: Evolving/moderate complexity  EVALUATION COMPLEXITY: Moderate    GOALS: Short term PT Goals Target date: 04/28/2024   Pt will be I and compliant with HEP. Baseline:  Goal status: MET 04/18/24 Pt will decrease pain by 25% overall Baseline: Goal status: MET 04/18/24  Long term PT goals Target date:06/23/2024  Pt will improve right shoulder and elbow AROM to [facility] to improve functional mobility Baseline: Goal status: ongoing 04/18/24 Pt will improve  right arm strength to at least 4+/5 MMT to improve functional strength Baseline: Goal status: ongoing 04/18/24 Pt will improve Patient specific functional scale (PSFS) to at least 7/10 to show improved function level Baseline: Goal status: ongoing 04/18/24 Pt will reduce pain to overall less than 3/10 with usual activity Baseline: Goal status: ongoing 04/18/24 PLAN: PT FREQUENCY: 1-3 times per week   PT DURATION: 6-8 weeks  PLANNED INTERVENTIONS (unless contraindicated):  97110-Therapeutic exercises, 97530- Therapeutic activity, 97112- Neuromuscular re-education, 97535- Self Care, [id number]- Manual therapy, [id number]- Electrical stimulation (unattended), [id number]- Vasopneumatic device, [id number]- Ultrasound, and 97033- Ionotophoresis 4mg /ml Dexamethasone   PLAN FOR NEXT SESSION: AAROM and PROM as tolerated, grip strength and arm strength has been difficult.    Felicia Both April Ma L Wylodean Shimmel, PT,DPT 05/18/2024, 9:34 AM

## 2024-05-23 ENCOUNTER — Ambulatory Visit: Payer: Self-pay | Admitting: Radiation Oncology

## 2024-05-24 ENCOUNTER — Ambulatory Visit: Admitting: Physical Therapy

## 2024-05-24 ENCOUNTER — Encounter: Payer: Self-pay | Admitting: Physical Therapy

## 2024-05-24 DIAGNOSIS — M25512 Pain in left shoulder: Secondary | ICD-10-CM

## 2024-05-24 DIAGNOSIS — M6281 Muscle weakness (generalized): Secondary | ICD-10-CM

## 2024-05-24 NOTE — Therapy (Signed)
 OUTPATIENT PHYSICAL THERAPY TREATMENT   Patient Name: Randy Hebert MRN: [id number] DOB:Mar 15, 1966, 58 y.o., male Today's Date: 05/24/2024  END OF SESSION:  PT End of Session - 05/24/24 0836     Visit Number 7    Number of Visits 20    Date for Recertification  06/23/24    Progress Note Due on Visit 10    PT Start Time 0830    PT Stop Time 0910    PT Time Calculation (min) 40 min    Activity Tolerance Patient tolerated treatment well    Behavior During Therapy [facility] for tasks assessed/performed             Past Medical History:  Diagnosis Date   ACE inhibitor-aggravated angioedema    Allergy     Anemia    Angio-edema    Anxiety    Arthritis    Asthma    hip replacement   Back pain    Bradycardia 04/28/2017   Bulging of cervical intervertebral disc    CAD (coronary artery disease)    lateral STEMI 02/06/2015 00% D1 occlusion treated with Promus Premier 2.5 mm x 16 mm DES, 70% ramus stenosis, 40% mid RCA stenosis, 45% distal RCA stenosis, EF 45-50%   CHF (congestive heart failure) (HCC)    COPD (chronic obstructive pulmonary disease) (HCC)    Depression    Diabetes mellitus without complication (HCC)    Difficult intubation    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   Dry eye    Dyspnea    Early satiety 09/23/2016   Fatty liver    GERD (gastroesophageal reflux disease)    HCAP (healthcare-associated pneumonia) 05/15/2017   Headache    Heart murmur    Hip pain    Hyperlipidemia    Hypertension    Lobar pneumonia 05/15/2017   Melena 08/04/2018   MI (myocardial infarction) (HCC)    Myocardial infarction (HCC)    Neck pain    Non-ST elevation (NSTEMI) myocardial infarction (HCC) 04/27/2017   NSTEMI (non-ST elevated myocardial infarction) (HCC) 04/26/2017   Otitis media    Pleurisy    Pneumonia due to COVID-19 virus 10/07/2019   Rectal bleeding 11/08/2018   Right shoulder pain 03/20/2020   Sinus pause    9 sec sinus pause on telemetry after  started on coreg  after MI, avoid AV nodal blocking agent   Sleep apnea    uses a cpap   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   STEMI (ST elevation myocardial infarction) (HCC) 05/31/2015   Substance abuse (HCC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   Syncope 06/29/2017   Syncope and collapse 05/15/2017   Transaminitis 08/04/2018   Unilateral primary osteoarthritis, right hip 07/01/2016   Past Surgical History:  Procedure Laterality Date   ARTERY EXPLORATION Left 03/24/2024   Procedure: LEFT ARCH AORTOGRAM, LEFT UPPER EXTREMITY ANGIOGRAM WITH FIRST ORDER CANNULATION, LEFT AXILLARY ANGIOPLASTY AND STENTING, EVACUATION OF LEFT AXILLARY HEMATOMA;  Surgeon: Magda Debby SAILOR, MD;  Location: [facility] OR;  Service: Vascular;  Laterality: Left;   ARTERY REPAIR Left 03/11/2024   Procedure: REPAIR LEFT AXILLARY ARTERY;  Surgeon: Serene Gaile ORN, MD;  Location: [facility] OR;  Service: Vascular;  Laterality: Left;  ARTERIAL REPAIR, LEFT AXILLARY   BIOPSY  10/09/2016   Procedure: BIOPSY;  Surgeon: Lamar CHRISTELLA Hollingshead, MD;  Location: [facility] ENDO SUITE;  Service: Endoscopy;;   BIOPSY  11/28/2019   Procedure: BIOPSY;  Surgeon: Hollingshead Lamar CHRISTELLA, MD;  Location: [facility];  Service: Endoscopy;;   BIOPSY  06/15/2023   Procedure: BIOPSY;  Surgeon: Shaaron Lamar HERO, MD;  Location: [facility];  Service: Endoscopy;;   BRONCHIAL BIOPSY  10/16/2022   Procedure: BRONCHIAL BIOPSIES;  Surgeon: Gladis Leonor HERO, MD;  Location: [facility] ENDOSCOPY;  Service: Pulmonary;;   BRONCHIAL NEEDLE ASPIRATION BIOPSY  10/16/2022   Procedure: BRONCHIAL NEEDLE ASPIRATION BIOPSIES;  Surgeon: Gladis Leonor HERO, MD;  Location: [facility] ENDOSCOPY;  Service: Pulmonary;;   BRONCHIAL WASHINGS  10/16/2022   Procedure: BRONCHIAL WASHINGS;  Surgeon: Gladis Leonor HERO, MD;  Location: [facility] ENDOSCOPY;  Service: Pulmonary;;   CARDIAC CATHETERIZATION N/A 02/06/2015   Procedure: Left Heart Cath and Coronary Angiography;  Surgeon: Alm LELON Clay, MD;  Location: [facility] INVASIVE CV LAB;   Service: Cardiovascular;  Laterality: N/A;   CARDIAC CATHETERIZATION N/A 02/06/2015   Procedure: Coronary Stent Intervention;  Surgeon: Alm LELON Clay, MD;  Location: [facility] INVASIVE CV LAB;  Service: Cardiovascular;  Laterality: N/A;   COLONOSCOPY WITH PROPOFOL  N/A 10/09/2016   Sigmoid and descending colon diverticulosis, four 4-6 mm polyps in sigmoid, one 4 mm polyp in descending. Tubular adenomas and hyperplastic. 5 year surveillance.    COLONOSCOPY WITH PROPOFOL  N/A 11/24/2016   Sigmoid and descending colon diverticulosis, four 4-6 mm polyps in sigmoid, one 4 mm polyp in descending. Tubular adenomas and hyperplastic. 5 year surveillance.    COLONOSCOPY WITH PROPOFOL  N/A 10/18/2021   Carver: nonbleeding internal hemorrhoids, small and large mouth diverticula found in the sigmoid, descending, transverse colon.  A 9 mm sessile polyp was found in the ascending colon that was removed.  4 sessile polyps found in the sigmoid, descending, transverse colon 3 to 5 mm in size, examining otherwise.  Path revealed tubular adenomas.  Repeat due in 5 years for surveillance.   CORONARY ANGIOPLASTY WITH STENT PLACEMENT  01/2015   CORONARY STENT INTERVENTION N/A 04/27/2017   Procedure: CORONARY STENT INTERVENTION;  Surgeon: Mady Bruckner, MD;  Location: [facility] INVASIVE CV LAB;  Service: Cardiovascular;  Laterality: N/A;   ELECTROPHYSIOLOGY STUDY N/A 06/29/2017   Procedure: ELECTROPHYSIOLOGY STUDY;  Surgeon: Waddell Danelle LELON, MD;  Location: [facility] INVASIVE CV LAB;  Service: Cardiovascular;  Laterality: N/A;   ESOPHAGOGASTRODUODENOSCOPY (EGD) WITH PROPOFOL  N/A 10/09/2016   Dr. Shaaron: LA grade a esophagitis.  Barrett's esophagus, biopsy-proven.  Small hiatal hernia.  EGD February 2019   ESOPHAGOGASTRODUODENOSCOPY (EGD) WITH PROPOFOL  N/A 11/28/2019    salmon-colored esophageal mucosa (Barrett's) small hiatal hernia, portal hypertensive gastropathy, normal duodenum, 3 year surveillance   ESOPHAGOGASTRODUODENOSCOPY (EGD) WITH  PROPOFOL  N/A 09/06/2021   three columns of grade 1 varices in distal esophagus, no stigmata of bleeding or red wale signs. Small hiatal hernia. Mild portal gastropathy. Normal duodenum. Repeat upper endoscopy in 1 year for surveillance.   ESOPHAGOGASTRODUODENOSCOPY (EGD) WITH PROPOFOL  N/A 06/15/2023   Procedure: ESOPHAGOGASTRODUODENOSCOPY (EGD) WITH PROPOFOL ;  Surgeon: Shaaron Lamar HERO, MD;  [facility];  Service: Endoscopy;  Laterality: N/A;  830am, asa 3   FIDUCIAL MARKER PLACEMENT  10/16/2022   Procedure: FIDUCIAL MARKER PLACEMENT;  Surgeon: Gladis Leonor HERO, MD;  Location: [facility] ENDOSCOPY;  Service: Pulmonary;;   INCISION / DRAINAGE HAND / FINGER     LEFT HEART CATH AND CORONARY ANGIOGRAPHY N/A 04/27/2017   Procedure: LEFT HEART CATH AND CORONARY ANGIOGRAPHY;  Surgeon: Mady Bruckner, MD;  Location: [facility] INVASIVE CV LAB;  Service: Cardiovascular;  Laterality: N/A;   LOOP RECORDER INSERTION  06/29/2017   Procedure: Loop Recorder Insertion;  Surgeon: Waddell Danelle LELON, MD;  Location:  [facility] INVASIVE CV LAB;  Service: Cardiovascular;;   POLYPECTOMY  11/24/2016   Procedure: POLYPECTOMY;  Surgeon: Lamar CHRISTELLA Hollingshead, MD;  [facility]: [facility];  Service: Endoscopy;;  descending and sigmoid   POLYPECTOMY  10/18/2021   Procedure: POLYPECTOMY;  Surgeon: Cindie Carlin POUR, DO;  Location: [facility];  Service: Endoscopy;;   TOTAL HIP ARTHROPLASTY Right 07/01/2016   TOTAL HIP ARTHROPLASTY Right 07/01/2016   Procedure: RIGHT TOTAL HIP ARTHROPLASTY ANTERIOR APPROACH;  Surgeon: Lonni CINDERELLA Poli, MD;  Location: [facility] OR;  Service: Orthopedics;  Laterality: Right;   ULTRASOUND GUIDANCE FOR VASCULAR ACCESS Left 03/24/2024   Procedure: ULTRASOUND GUIDANCE, FOR VASCULAR ACCESS;  Surgeon: Magda Debby SAILOR, MD;  Location: [facility] OR;  Service: Vascular;  Laterality: Left;   Patient Active Problem List   Diagnosis Date Noted   Pain of left upper extremity 04/24/2024   Acute postoperative anemia due to greater  than expected blood loss 03/26/2024   Obesity, class 1 03/26/2024   Aneurysm of axillary artery 03/24/2024   Axillary artery injury 03/12/2024   Acute CVA (cerebrovascular accident) (HCC) 03/02/2024   Essential hypertension 03/02/2024   Dyslipidemia 03/02/2024   Type 2 diabetes mellitus with hyperlipidemia (HCC) 03/02/2024   Erectile dysfunction 02/24/2023   Chronic eczematous otitis externa of both ears 02/05/2023   Tobacco abuse 12/25/2022   Malignant neoplasm of upper lobe of right lung (HCC) 09/25/2022   Cervical spondylosis with myelopathy 08/01/2022   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   DDD (degenerative disc disease), cervical 05/21/2022   Osteoarthritis of both hands 05/19/2022   History of GI bleed 11/13/2021   Insomnia 09/13/2021   Cirrhosis of liver (HCC) 11/08/2018   Barrett's esophagus 08/04/2018   Vitamin D  deficiency 06/30/2018   Alcohol  abuse 06/30/2018   DM type 2 causing vascular disease (HCC) 08/17/2017   Chronic combined systolic and diastolic CHF (congestive heart failure) (HCC) 05/15/2017   OSA (obstructive sleep apnea) 04/28/2017   Depression with anxiety 04/15/2017   GERD (gastroesophageal reflux disease) 09/23/2016   Asthmatic bronchitis , chronic (HCC) 08/27/2015   Mixed hyperlipidemia 08/27/2015   Class 1 obesity due to excess calories with serious comorbidity and body mass index (BMI) of 32.0 to 32.9 in adult 08/27/2015   CAD (coronary artery disease) 05/31/2015    PCP: Cook, Jayce G, DO   REFERRING PROVIDER: Noralee Elidia Sieving*   REFERRING DIAG: left axillary pseudoaneurysm   Rationale for Evaluation and Treatment:  Rehabiliation  THERAPY DIAG:  Acute pain of left shoulder  Muscle weakness (generalized)  ONSET DATE: 03/11/24   SUBJECTIVE:  SUBJECTIVE  STATEMENT: Pt relays he has referral for NCV test and is waiting to be called by [facility] to get this set up.   PERTINENT HISTORY:  past medical history of diabetes mellitus type 2, hypertension, CAD, liver cirrhosis, anxiety, depression, OSA, CVA w/ left-sided weakness who had a recent hospitalization 7/11 to 03/19/24 post traumatic left shoulder dislocation, complicated with left axillary hematoma with left axillary pseudoaneurysm. On 07/12 he underwent open exposure and repair of the left axillary pseudoaneurysm and evacuation of left axillary hematoma.   PAIN:  NPRS scale: 5-8/10 Pain location:left shoulder/axillary, left forearm and wrist Pain description: constant, achy, sharp Aggravating factors: any use of this left arm. Relieving factors: rest, meds   PRECAUTIONS: ,  Shoulder  RED FLAGS: None   WEIGHT BEARING RESTRICTIONS:  Not anymore  FALLS:  Has patient fallen in last 6 months? Yes. Number of falls 1   OCCUPATION:  disabled  PLOF:  Independent with basic ADLs  PATIENT GOALS:  Reduce pain and get his arm back to normal  OBJECTIVE:  Note: Objective measures were completed at Evaluation unless otherwise noted.  DIAGNOSTIC FINDINGS:  Chest CT IMPRESSION: 1. Enlarging left axillary hematoma with arterial contrast extravasation from the posterior wall of the left axillary artery into a 1.5 x 1.8 cm pseudoaneurysm, with additional 1.8 x 1.1 cm pocket of contrast more posteriorly within the collection. 2. Increased heterogeneous hematoma in the left subscapularis region measuring 7.3 x 4.8 cm, but no focal contrast is seen within this. 3. Emphysema. 4. Right apical fiducial markers and post XRT changes, with treated nodule just above the fiducial markers today measuring 8 mm, previously 9 mm. 5. Aortic and coronary artery atherosclerosis.  Right aortic arch. 6. Enlarged pulmonary trunk 3.3 cm indicating arterial hypertension. 7. Cirrhosis and splenomegaly.   Prominent patent portal vein 1.7 cm. 8. Critical Value/emergent results were called by telephone at the time of interpretation on 03/24/2024 at 5:18 am to provider [facility] , who verbally acknowledged these results.  Shoulder XR IMPRESSION: No acute bony abnormality.   Postoperative changes in the left axilla.  No imaging for left elbow/wrist  PATIENT SURVEYS:  Patient-Specific Activity Scoring Scheme  0 represents "unable to perform." 10 represents "able to perform at prior level. 0 1 2 3 4 5 6 7 8 9  10 (Date and Score)   Activity Eval    1. Raising left arm  0    2. Putting on and off shirt 1    3.     4.    5.    Score 0.5/10    Total score = sum of the activity scores/number of activities Minimum detectable change (90%CI) for average score = 2 points Minimum detectable change (90%CI) for single activity score = 3 points     EDEMA:  Yes: moderate edema noted in left upper extremity from shoulder to hand  Observation:  2 incisions noted, one at proximal chest, one at axilla, both incisions closed with stitches and appear closed without any drainage or signs of infection at the moment. Dressing was removed and changed for him at eval.     UPPER EXTREMITY ROM:  AROM/Passive ROM Left eval Left 04/13/24 Left 05/24/24  Shoulder flexion /50 /120 30/150  Shoulder extension     Shoulder abduction /50 /100 20/140  Shoulder adduction     Shoulder extension     Shoulder internal rotation /50    Shoulder external rotation /40 /70   Elbow flexion  Elbow extension     Wrist flexion     Wrist extension     Wrist ulnar deviation     Wrist radial deviation     Wrist pronation     Wrist supination      (Blank rows = not tested)   UPPER EXTREMITY MMT:  MMT Left eval Left 05/24/24  Shoulder flexion 1 2  Shoulder extension    Shoulder abduction 1 2  Shoulder adduction    Shoulder extension    Shoulder internal rotation 1 2  Shoulder external rotation 1  2  Middle trapezius    Lower trapezius    Elbow flexion 1 3  Elbow extension 2 3  Wrist flexion    Wrist extension    Wrist ulnar deviation    Wrist radial deviation    Wrist pronation    Wrist supination    Grip strength     (Blank rows = not tested)                                                                                                                              TREATMENT DATE:  05/24/24 Theractivity Nu step L2 X 7 min, working on shoulder push/pull as well as grip with left hand  Standing p ball roll outs on mat table for AAROM flexion, abduction, circles CW & CCW X 15 reps each for reaching Gripping towel X 20 Gripping small cone and lifting it up and over ball at waist height X 10  Therex: Supine shoulder flexion eccentrics with PT assist x15 Supine chest press AROM X 15 Supine shoulder ER against manual PT resistance X 15 Supine shoulder IR against manual PT resistance X 15 Supine shoulder AAROM hands clasped X 15 Standing elbow AROM/bicep curl X 15 Standing scapular retractions X 15 AROM  Manual therapy gentle PROM fingers, thumb, hand Shoulder PROM all planes to tolerance  05/18/24 Therex: Standing p ball roll outs on mat table for AAROM flexion, abduction, circles CW & CCW X 15 reps each Supine shoulder flexion eccentrics with PT assist x5 Supine shoulder ER yellow TB 2x10 Supine shoulder IR yellow TB 2x10 Supine shoulder flex/ext yellow TB eccentrics 2x10 Supine wrist flex/ext AROM x20 Supine finger adduction/abduction x20 Standing table push up x10  Manual therapy: manual drainage massage fingers/hands gentle PROM fingers, thumb, hand, wrist STM & TPR wrist/hand extensors/flexors Gentle finger joint PROM   PATIENT EDUCATION: Education details: HEP, PT plan of care, selfcare Person educated: Patient Education method: Explanation, Demonstration, Verbal cues, and Handouts Education comprehension: verbalized understanding, further education  recommended   HOME EXERCISE PROGRAM: Access Code: [email or web address] URL: [email or web address] Date: 03/31/2024 Prepared by: Redell Moose  Exercises - Circular Shoulder Pendulum with Table Support  - 2-3 x daily - 7 x weekly - 1 sets - 15 reps - Seated Shoulder Abduction Towel Slide at Table Top  - 2-3 x daily - 6 x weekly - 1 sets - 10 reps -  5 sec hold - Seated Shoulder Flexion Towel Slide at Table Top  - 2-3 x daily - 6 x weekly - 1 sets - 10 reps - 5 sec hold - Seated Shoulder External Rotation PROM on Table  - 2-3 x daily - 6 x weekly - 1 sets - 10 reps - 5 sec hold - Seated Gripping Towel  - 2-3 x daily - 6 x weekly - 1 sets - 20 reps - Standing Elbow Flexion Extension AROM  - 2-3 x daily - 6 x weekly - 1 sets - 10 reps  ASSESSMENT:  CLINICAL IMPRESSION: He showed improved PROM measurements today but AROM remains very limited against gravity due to weakness. He has referral to have NCV to examine any nerve damage that may be present and contributing to this.   OBJECTIVE IMPAIRMENTS: decreased activity tolerance for ADL's, difficulty walking, decreased balance, decreased endurance, decreased mobility, decreased ROM, decreased strength, impaired flexibility, impaired UE use, and pain.  ACTIVITY LIMITATIONS: bending, lifting, carry, reaching, locomotion, cleaning, community activity, driving,  PERSONAL FACTORS: see above PMH  also affecting patient's functional outcome.  REHAB POTENTIAL: Good  CLINICAL DECISION MAKING: Evolving/moderate complexity  EVALUATION COMPLEXITY: Moderate    GOALS: Short term PT Goals Target date: 04/28/2024   Pt will be I and compliant with HEP. Baseline:  Goal status: MET 04/18/24 Pt will decrease pain by 25% overall Baseline: Goal status: MET 04/18/24  Long term PT goals Target date:06/23/2024  Pt will improve right shoulder and elbow AROM to [facility] to improve functional mobility Baseline: Goal status: ongoing 9/22 Pt will improve   right arm strength to at least 4+/5 MMT to improve functional strength Baseline: Goal status: ongoing 9/22 Pt will improve Patient specific functional scale (PSFS) to at least 7/10 to show improved function level Baseline: Goal status: ongoing 9/22 Pt will reduce pain to overall less than 3/10 with usual activity Baseline: Goal status: ongoing 9/22 PLAN: PT FREQUENCY: 1-3 times per week   PT DURATION: 6-8 weeks  PLANNED INTERVENTIONS (unless contraindicated):  97110-Therapeutic exercises, 97530- Therapeutic activity, 97112- Neuromuscular re-education, 97535- Self Care, 02859- Manual therapy, [id number]- Electrical stimulation (unattended), 97016- Vasopneumatic device, [id number]- Ultrasound, and 97033- Ionotophoresis 4mg /ml Dexamethasone   PLAN FOR NEXT SESSION: AAROM and PROM as tolerated, grip strength and arm strength has been difficult.    Redell JONELLE Moose, PT,DPT 05/24/2024, 9:06 AM

## 2024-05-27 ENCOUNTER — Telehealth: Payer: Self-pay | Admitting: "Endocrinology

## 2024-05-27 ENCOUNTER — Other Ambulatory Visit: Payer: Self-pay | Admitting: Cardiology

## 2024-05-27 DIAGNOSIS — E1159 Type 2 diabetes mellitus with other circulatory complications: Secondary | ICD-10-CM

## 2024-05-27 MED ORDER — TRULICITY 1.5 MG/0.5ML ~~LOC~~ SOAJ: 1.5000 mg | SUBCUTANEOUS | 0 refills | Status: DC

## 2024-05-27 MED ORDER — METFORMIN HCL 500 MG PO TABS: 500.0000 mg | ORAL_TABLET | Freq: Two times a day (BID) | ORAL | 0 refills | Status: DC

## 2024-05-27 MED ORDER — DAPAGLIFLOZIN PROPANEDIOL 10 MG PO TABS: 10.0000 mg | ORAL_TABLET | Freq: Every day | ORAL | 0 refills | Status: DC

## 2024-05-27 NOTE — Telephone Encounter (Signed)
 Pt needs RX of Metformin , Farxiga , and trulicity  sent to [facility]

## 2024-05-27 NOTE — Telephone Encounter (Signed)
 Rx refills for Metformin , Farxiga  and Trulicity  sent to [facility] for 90 day supplies.

## 2024-05-30 ENCOUNTER — Encounter: Payer: Self-pay | Admitting: Physical Therapy

## 2024-05-30 ENCOUNTER — Ambulatory Visit: Admitting: Physical Therapy

## 2024-05-30 DIAGNOSIS — M6281 Muscle weakness (generalized): Secondary | ICD-10-CM

## 2024-05-30 DIAGNOSIS — M25512 Pain in left shoulder: Secondary | ICD-10-CM | POA: Diagnosis not present

## 2024-05-30 NOTE — Therapy (Signed)
 OUTPATIENT PHYSICAL THERAPY TREATMENT   Patient Name: Randy Hebert MRN: [id number] DOB:1966/02/19, 58 y.o., male Today's Date: 05/30/2024  END OF SESSION:  PT End of Session - 05/30/24 0844     Visit Number 8    Number of Visits 20    Date for Recertification  06/23/24    Progress Note Due on Visit 10    PT Start Time 0840    PT Stop Time 0920    PT Time Calculation (min) 40 min    Activity Tolerance Patient tolerated treatment well    Behavior During Therapy [facility] for tasks assessed/performed             Past Medical History:  Diagnosis Date   ACE inhibitor-aggravated angioedema    Allergy     Anemia    Angio-edema    Anxiety    Arthritis    Asthma    hip replacement   Back pain    Bradycardia 04/28/2017   Bulging of cervical intervertebral disc    CAD (coronary artery disease)    lateral STEMI 02/06/2015 00% D1 occlusion treated with Promus Premier 2.5 mm x 16 mm DES, 70% ramus stenosis, 40% mid RCA stenosis, 45% distal RCA stenosis, EF 45-50%   CHF (congestive heart failure) (HCC)    COPD (chronic obstructive pulmonary disease) (HCC)    Depression    Diabetes mellitus without complication (HCC)    Difficult intubation    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   Dry eye    Dyspnea    Early satiety 09/23/2016   Fatty liver    GERD (gastroesophageal reflux disease)    HCAP (healthcare-associated pneumonia) 05/15/2017   Headache    Heart murmur    Hip pain    Hyperlipidemia    Hypertension    Lobar pneumonia 05/15/2017   Melena 08/04/2018   MI (myocardial infarction) (HCC)    Myocardial infarction (HCC)    Neck pain    Non-ST elevation (NSTEMI) myocardial infarction (HCC) 04/27/2017   NSTEMI (non-ST elevated myocardial infarction) (HCC) 04/26/2017   Otitis media    Pleurisy    Pneumonia due to COVID-19 virus 10/07/2019   Rectal bleeding 11/08/2018   Right shoulder pain 03/20/2020   Sinus pause    9 sec sinus pause on telemetry after  started on coreg  after MI, avoid AV nodal blocking agent   Sleep apnea    uses a cpap   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   STEMI (ST elevation myocardial infarction) (HCC) 05/31/2015   Substance abuse (HCC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   Syncope 06/29/2017   Syncope and collapse 05/15/2017   Transaminitis 08/04/2018   Unilateral primary osteoarthritis, right hip 07/01/2016   Past Surgical History:  Procedure Laterality Date   ARTERY EXPLORATION Left 03/24/2024   Procedure: LEFT ARCH AORTOGRAM, LEFT UPPER EXTREMITY ANGIOGRAM WITH FIRST ORDER CANNULATION, LEFT AXILLARY ANGIOPLASTY AND STENTING, EVACUATION OF LEFT AXILLARY HEMATOMA;  Surgeon: Magda Debby SAILOR, MD;  Location: [facility] OR;  Service: Vascular;  Laterality: Left;   ARTERY REPAIR Left 03/11/2024   Procedure: REPAIR LEFT AXILLARY ARTERY;  Surgeon: Serene Gaile ORN, MD;  Location: [facility] OR;  Service: Vascular;  Laterality: Left;  ARTERIAL REPAIR, LEFT AXILLARY   BIOPSY  10/09/2016   Procedure: BIOPSY;  Surgeon: Lamar CHRISTELLA Hollingshead, MD;  Location: [facility] END[facility] SUITE;  Service: Endoscopy;;   BIOPSY  11/28/2019   Procedure: BIOPSY;  Surgeon: Hollingshead Lamar CHRISTELLA, MD;  Location: [facility];  Service: Endoscopy;;   BIOPSY  06/15/2023   Procedure: BIOPSY;  Surgeon: Shaaron Lamar HERO, MD;  Location: [facility];  Service: Endoscopy;;   BRONCHIAL BIOPSY  10/16/2022   Procedure: BRONCHIAL BIOPSIES;  Surgeon: Gladis Leonor HERO, MD;  Location: [facility] ENDOSCOPY;  Service: Pulmonary;;   BRONCHIAL NEEDLE ASPIRATION BIOPSY  10/16/2022   Procedure: BRONCHIAL NEEDLE ASPIRATION BIOPSIES;  Surgeon: Gladis Leonor HERO, MD;  Location: [facility] ENDOSCOPY;  Service: Pulmonary;;   BRONCHIAL WASHINGS  10/16/2022   Procedure: BRONCHIAL WASHINGS;  Surgeon: Gladis Leonor HERO, MD;  Location: [facility] ENDOSCOPY;  Service: Pulmonary;;   CARDIAC CATHETERIZATION N/A 02/06/2015   Procedure: Left Heart Cath and Coronary Angiography;  Surgeon: Alm LELON Clay, MD;  Location: [facility] INVASIVE CV LAB;   Service: Cardiovascular;  Laterality: N/A;   CARDIAC CATHETERIZATION N/A 02/06/2015   Procedure: Coronary Stent Intervention;  Surgeon: Alm LELON Clay, MD;  Location: [facility] INVASIVE CV LAB;  Service: Cardiovascular;  Laterality: N/A;   COLONOSCOPY WITH PROPOFOL  N/A 10/09/2016   Sigmoid and descending colon diverticulosis, four 4-6 mm polyps in sigmoid, one 4 mm polyp in descending. Tubular adenomas and hyperplastic. 5 year surveillance.    COLONOSCOPY WITH PROPOFOL  N/A 11/24/2016   Sigmoid and descending colon diverticulosis, four 4-6 mm polyps in sigmoid, one 4 mm polyp in descending. Tubular adenomas and hyperplastic. 5 year surveillance.    COLONOSCOPY WITH PROPOFOL  N/A 10/18/2021   Carver: nonbleeding internal hemorrhoids, small and large mouth diverticula found in the sigmoid, descending, transverse colon.  A 9 mm sessile polyp was found in the ascending colon that was removed.  4 sessile polyps found in the sigmoid, descending, transverse colon 3 to 5 mm in size, examining otherwise.  Path revealed tubular adenomas.  Repeat due in 5 years for surveillance.   CORONARY ANGIOPLASTY WITH STENT PLACEMENT  01/2015   CORONARY STENT INTERVENTION N/A 04/27/2017   Procedure: CORONARY STENT INTERVENTION;  Surgeon: Mady Bruckner, MD;  Location: [facility] INVASIVE CV LAB;  Service: Cardiovascular;  Laterality: N/A;   ELECTROPHYSIOLOGY STUDY N/A 06/29/2017   Procedure: ELECTROPHYSIOLOGY STUDY;  Surgeon: Waddell Danelle LELON, MD;  Location: [facility] INVASIVE CV LAB;  Service: Cardiovascular;  Laterality: N/A;   ESOPHAGOGASTRODUODENOSCOPY (EGD) WITH PROPOFOL  N/A 10/09/2016   Dr. Shaaron: LA grade a esophagitis.  Barrett's esophagus, biopsy-proven.  Small hiatal hernia.  EGD February 2019   ESOPHAGOGASTRODUODENOSCOPY (EGD) WITH PROPOFOL  N/A 11/28/2019    salmon-colored esophageal mucosa (Barrett's) small hiatal hernia, portal hypertensive gastropathy, normal duodenum, 3 year surveillance   ESOPHAGOGASTRODUODENOSCOPY (EGD) WITH  PROPOFOL  N/A 09/06/2021   three columns of grade 1 varices in distal esophagus, no stigmata of bleeding or red wale signs. Small hiatal hernia. Mild portal gastropathy. Normal duodenum. Repeat upper endoscopy in 1 year for surveillance.   ESOPHAGOGASTRODUODENOSCOPY (EGD) WITH PROPOFOL  N/A 06/15/2023   Procedure: ESOPHAGOGASTRODUODENOSCOPY (EGD) WITH PROPOFOL ;  Surgeon: Shaaron Lamar HERO, MD;  Location: [facility];  Service: Endoscopy;  Laterality: N/A;  830am, asa 3   FIDUCIAL MARKER PLACEMENT  10/16/2022   Procedure: FIDUCIAL MARKER PLACEMENT;  Surgeon: Gladis Leonor HERO, MD;  Location: [facility] ENDOSCOPY;  Service: Pulmonary;;   INCISION / DRAINAGE HAND / FINGER     LEFT HEART CATH AND CORONARY ANGIOGRAPHY N/A 04/27/2017   Procedure: LEFT HEART CATH AND CORONARY ANGIOGRAPHY;  Surgeon: Mady Bruckner, MD;  Location: [facility] INVASIVE CV LAB;  Service: Cardiovascular;  Laterality: N/A;   LOOP RECORDER INSERTION  06/29/2017   Procedure: Loop Recorder Insertion;  Surgeon: Waddell Danelle LELON, MD;  Location:  [facility] INVASIVE CV LAB;  Service: Cardiovascular;;   POLYPECTOMY  11/24/2016   Procedure: POLYPECTOMY;  Surgeon: Lamar CHRISTELLA Hollingshead, MD;  Location: [facility];  Service: Endoscopy;;  descending and sigmoid   POLYPECTOMY  10/18/2021   Procedure: POLYPECTOMY;  Surgeon: Cindie Carlin POUR, DO;  Location: [facility];  Service: Endoscopy;;   TOTAL HIP ARTHROPLASTY Right 07/01/2016   TOTAL HIP ARTHROPLASTY Right 07/01/2016   Procedure: RIGHT TOTAL HIP ARTHROPLASTY ANTERIOR APPROACH;  Surgeon: Lonni CINDERELLA Poli, MD;  Location: [facility] OR;  Service: Orthopedics;  Laterality: Right;   ULTRASOUND GUIDANCE FOR VASCULAR ACCESS Left 03/24/2024   Procedure: ULTRASOUND GUIDANCE, FOR VASCULAR ACCESS;  Surgeon: Magda Debby SAILOR, MD;  Location: [facility] OR;  Service: Vascular;  Laterality: Left;   Patient Active Problem List   Diagnosis Date Noted   Pain of left upper extremity 04/24/2024   Acute postoperative anemia due to greater  than expected blood loss 03/26/2024   Obesity, class 1 03/26/2024   Aneurysm of axillary artery 03/24/2024   Axillary artery injury 03/12/2024   Acute CVA (cerebrovascular accident) (HCC) 03/02/2024   Essential hypertension 03/02/2024   Dyslipidemia 03/02/2024   Type 2 diabetes mellitus with hyperlipidemia (HCC) 03/02/2024   Erectile dysfunction 02/24/2023   Chronic eczematous otitis externa of both ears 02/05/2023   Tobacco abuse 12/25/2022   Malignant neoplasm of upper lobe of right lung (HCC) 09/25/2022   Cervical spondylosis with myelopathy 08/01/2022   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   DDD (degenerative disc disease), cervical 05/21/2022   Osteoarthritis of both hands 05/19/2022   History of GI bleed 11/13/2021   Insomnia 09/13/2021   Cirrhosis of liver (HCC) 11/08/2018   Barrett's esophagus 08/04/2018   Vitamin D  deficiency 06/30/2018   Alcohol  abuse 06/30/2018   DM type 2 causing vascular disease (HCC) 08/17/2017   Chronic combined systolic and diastolic CHF (congestive heart failure) (HCC) 05/15/2017   OSA (obstructive sleep apnea) 04/28/2017   Depression with anxiety 04/15/2017   GERD (gastroesophageal reflux disease) 09/23/2016   Asthmatic bronchitis , chronic (HCC) 08/27/2015   Mixed hyperlipidemia 08/27/2015   Class 1 obesity due to excess calories with serious comorbidity and body mass index (BMI) of 32.0 to 32.9 in adult 08/27/2015   CAD (coronary artery disease) 05/31/2015    PCP: Cook, Jayce G, DO   REFERRING PROVIDER: Noralee Elidia Sieving*   REFERRING DIAG: left axillary pseudoaneurysm   Rationale for Evaluation and Treatment:  Rehabiliation  THERAPY DIAG:  Acute pain of left shoulder  Muscle weakness (generalized)  ONSET DATE: 03/11/24   SUBJECTIVE:  SUBJECTIVE  STATEMENT: Pt relays he will have NCV test 06/01/24, continues to relay difficulty using his left hand and shoulder  PERTINENT HISTORY:  past medical history of diabetes mellitus type 2, hypertension, CAD, liver cirrhosis, anxiety, depression, OSA, CVA w/ left-sided weakness who had a recent hospitalization 7/11 to 03/19/24 post traumatic left shoulder dislocation, complicated with left axillary hematoma with left axillary pseudoaneurysm. On 07/12 he underwent open exposure and repair of the left axillary pseudoaneurysm and evacuation of left axillary hematoma.   PAIN:  NPRS scale: 5-8/10 Pain location:left shoulder/axillary, left forearm and wrist Pain description: constant, achy, sharp Aggravating factors: any use of this left arm. Relieving factors: rest, meds   PRECAUTIONS: ,  Shoulder  RED FLAGS: None   WEIGHT BEARING RESTRICTIONS:  Not anymore  FALLS:  Has patient fallen in last 6 months? Yes. Number of falls 1   OCCUPATION:  disabled  PLOF:  Independent with basic ADLs  PATIENT GOALS:  Reduce pain and get his arm back to normal  OBJECTIVE:  Note: Objective measures were completed at Evaluation unless otherwise noted.  DIAGNOSTIC FINDINGS:  Chest CT IMPRESSION: 1. Enlarging left axillary hematoma with arterial contrast extravasation from the posterior wall of the left axillary artery into a 1.5 x 1.8 cm pseudoaneurysm, with additional 1.8 x 1.1 cm pocket of contrast more posteriorly within the collection. 2. Increased heterogeneous hematoma in the left subscapularis region measuring 7.3 x 4.8 cm, but no focal contrast is seen within this. 3. Emphysema. 4. Right apical fiducial markers and post XRT changes, with treated nodule just above the fiducial markers today measuring 8 mm, previously 9 mm. 5. Aortic and coronary artery atherosclerosis.  Right aortic arch. 6. Enlarged pulmonary trunk 3.3 cm indicating arterial hypertension. 7. Cirrhosis and  splenomegaly.  Prominent patent portal vein 1.7 cm. 8. Critical Value/emergent results were called by telephone at the time of interpretation on 03/24/2024 at 5:18 am to provider [facility] , who verbally acknowledged these results.  Shoulder XR IMPRESSION: No acute bony abnormality.   Postoperative changes in the left axilla.  No imaging for left elbow/wrist  PATIENT SURVEYS:  Patient-Specific Activity Scoring Scheme  0 represents "unable to perform." 10 represents "able to perform at prior level. 0 1 2 3 4 5 6 7 8 9  10 (Date and Score)   Activity Eval    1. Raising left arm  0    2. Putting on and off shirt 1    3.     4.    5.    Score 0.5/10    Total score = sum of the activity scores/number of activities Minimum detectable change (90%CI) for average score = 2 points Minimum detectable change (90%CI) for single activity score = 3 points     EDEMA:  Yes: moderate edema noted in left upper extremity from shoulder to hand  Observation:  2 incisions noted, one at proximal chest, one at axilla, both incisions closed with stitches and appear closed without any drainage or signs of infection at the moment. Dressing was removed and changed for him at eval.     UPPER EXTREMITY ROM:  AROM/Passive ROM Left eval Left 04/13/24 Left 05/24/24  Shoulder flexion /50 /120 30/150  Shoulder extension     Shoulder abduction /50 /100 20/140  Shoulder adduction     Shoulder extension     Shoulder internal rotation /50    Shoulder external rotation /40 /70   Elbow flexion     Elbow extension  Wrist flexion     Wrist extension     Wrist ulnar deviation     Wrist radial deviation     Wrist pronation     Wrist supination      (Blank rows = not tested)   UPPER EXTREMITY MMT:  MMT Left eval Left 05/24/24  Shoulder flexion 1 2  Shoulder extension    Shoulder abduction 1 2  Shoulder adduction    Shoulder extension    Shoulder internal rotation 1 2  Shoulder  external rotation 1 2  Middle trapezius    Lower trapezius    Elbow flexion 1 3  Elbow extension 2 3  Wrist flexion    Wrist extension    Wrist ulnar deviation    Wrist radial deviation    Wrist pronation    Wrist supination    Grip strength     (Blank rows = not tested)                                                                                                                              TREATMENT DATE:  05/30/24 Theractivity Nu step L2 X 10 min, working on shoulder push/pull as well as grip with left hand  Standing p ball roll outs on mat table for AAROM flexion, abduction, circles CW & CCW X 15 reps each for reaching Gripping towel X 20 Gripping small green cone and lifting it up and over ball at waist height X 10  Therex: Supine shoulder flexion eccentrics with PT assist x15 Supine chest press AROM X 15 Supine shoulder ER against manual PT resistance X 15 Supine shoulder IR against manual PT resistance X 15 Supine shoulder AAROM hands clasped X 15 Standing elbow AROM/bicep curl X 15 Standing scapular retractions X 15 AROM  Manual therapy gentle PROM fingers, thumb, hand Shoulder PROM flexion and abduction     PATIENT EDUCATION: Education details: HEP, PT plan of care, selfcare Person educated: Patient Education method: Explanation, Demonstration, Verbal cues, and Handouts Education comprehension: verbalized understanding, further education recommended   HOME EXERCISE PROGRAM: Access Code: [email or web address] URL: [email or web address] Date: 03/31/2024 Prepared by: Redell Moose  Exercises - Circular Shoulder Pendulum with Table Support  - 2-3 x daily - 7 x weekly - 1 sets - 15 reps - Seated Shoulder Abduction Towel Slide at Table Top  - 2-3 x daily - 6 x weekly - 1 sets - 10 reps - 5 sec hold - Seated Shoulder Flexion Towel Slide at Table Top  - 2-3 x daily - 6 x weekly - 1 sets - 10 reps - 5 sec hold - Seated Shoulder External Rotation PROM on Table   - 2-3 x daily - 6 x weekly - 1 sets - 10 reps - 5 sec hold - Seated Gripping Towel  - 2-3 x daily - 6 x weekly - 1 sets - 20 reps - Standing Elbow Flexion Extension AROM  - 2-3 x daily -  6 x weekly - 1 sets - 10 reps  ASSESSMENT:  CLINICAL IMPRESSION: He will have NCV to examine any nerve damage that may be present and contributing to to his slow progress with his strength in his shoulder and hand.   OBJECTIVE IMPAIRMENTS: decreased activity tolerance for ADL's, difficulty walking, decreased balance, decreased endurance, decreased mobility, decreased ROM, decreased strength, impaired flexibility, impaired UE use, and pain.  ACTIVITY LIMITATIONS: bending, lifting, carry, reaching, locomotion, cleaning, community activity, driving,  PERSONAL FACTORS: see above PMH  also affecting patient's functional outcome.  REHAB POTENTIAL: Good  CLINICAL DECISION MAKING: Evolving/moderate complexity  EVALUATION COMPLEXITY: Moderate    GOALS: Short term PT Goals Target date: 04/28/2024   Pt will be I and compliant with HEP. Baseline:  Goal status: MET 04/18/24 Pt will decrease pain by 25% overall Baseline: Goal status: MET 04/18/24  Long term PT goals Target date:06/23/2024  Pt will improve right shoulder and elbow AROM to [facility] to improve functional mobility Baseline: Goal status: ongoing 9/22 Pt will improve  right arm strength to at least 4+/5 MMT to improve functional strength Baseline: Goal status: ongoing 9/22 Pt will improve Patient specific functional scale (PSFS) to at least 7/10 to show improved function level Baseline: Goal status: ongoing 9/22 Pt will reduce pain to overall less than 3/10 with usual activity Baseline: Goal status: ongoing 9/22 PLAN: PT FREQUENCY: 1-3 times per week   PT DURATION: 6-8 weeks  PLANNED INTERVENTIONS (unless contraindicated):  97110-Therapeutic exercises, 97530- Therapeutic activity, 97112- Neuromuscular re-education, 97535- Self Care, [id number]-  Manual therapy, G0283- Electrical stimulation (unattended), 97016- Vasopneumatic device, [id number]- Ultrasound, and 97033- Ionotophoresis 4mg /ml Dexamethasone   PLAN FOR NEXT SESSION: AAROM and PROM as tolerated, grip strength and arm strength has been difficult.    Redell JONELLE Moose, PT,DPT 05/30/2024, 8:44 AM

## 2024-06-01 LAB — COMPREHENSIVE METABOLIC PANEL WITH GFR
ALT: 60 IU/L — ABNORMAL HIGH (ref 0–44)
AST: 44 IU/L — ABNORMAL HIGH (ref 0–40)
Albumin: 4.2 g/dL (ref 3.8–4.9)
Alkaline Phosphatase: 232 IU/L — ABNORMAL HIGH (ref 47–123)
BUN/Creatinine Ratio: 10 (ref 9–20)
BUN: 6 mg/dL (ref 6–24)
Bilirubin Total: 0.5 mg/dL (ref 0.0–1.2)
CO2: 24 mmol/L (ref 20–29)
Calcium: 9.6 mg/dL (ref 8.7–10.2)
Chloride: 102 mmol/L (ref 96–106)
Creatinine, Ser: 0.58 mg/dL — ABNORMAL LOW (ref 0.76–1.27)
Globulin, Total: 3 g/dL (ref 1.5–4.5)
Glucose: 148 mg/dL — ABNORMAL HIGH (ref 70–99)
Potassium: 4.2 mmol/L (ref 3.5–5.2)
Sodium: 142 mmol/L (ref 134–144)
Total Protein: 7.2 g/dL (ref 6.0–8.5)
eGFR: 112 mL/min/1.73 (ref >59)

## 2024-06-01 LAB — LIPID PANEL
Chol/HDL Ratio: 3.8 ratio (ref 0.0–5.0)
Cholesterol, Total: 110 mg/dL (ref 100–199)
HDL: 29 mg/dL — ABNORMAL LOW (ref >39)
LDL Chol Calc (NIH): 61 mg/dL (ref 0–99)
Triglycerides: 105 mg/dL (ref 0–149)
VLDL Cholesterol Cal: 20 mg/dL (ref 5–40)

## 2024-06-01 LAB — TSH: TSH: 1.42 u[IU]/mL (ref 0.450–4.500)

## 2024-06-01 LAB — T4, FREE: Free T4: 1.55 ng/dL (ref 0.82–1.77)

## 2024-06-02 ENCOUNTER — Telehealth: Payer: Self-pay | Admitting: Cardiology

## 2024-06-02 ENCOUNTER — Ambulatory Visit: Attending: Cardiology | Admitting: Cardiology

## 2024-06-02 ENCOUNTER — Encounter: Payer: Self-pay | Admitting: Cardiology

## 2024-06-02 ENCOUNTER — Ambulatory Visit: Admitting: Cardiology

## 2024-06-02 VITALS — BP 126/74 | HR 76 | Ht 65.0 in | Wt 171.4 lb

## 2024-06-02 DIAGNOSIS — I5020 Unspecified systolic (congestive) heart failure: Secondary | ICD-10-CM | POA: Diagnosis not present

## 2024-06-02 DIAGNOSIS — I2510 Atherosclerotic heart disease of native coronary artery without angina pectoris: Secondary | ICD-10-CM | POA: Diagnosis not present

## 2024-06-02 DIAGNOSIS — E782 Mixed hyperlipidemia: Secondary | ICD-10-CM

## 2024-06-02 DIAGNOSIS — I639 Cerebral infarction, unspecified: Secondary | ICD-10-CM

## 2024-06-02 DIAGNOSIS — Z8673 Personal history of transient ischemic attack (TIA), and cerebral infarction without residual deficits: Secondary | ICD-10-CM

## 2024-06-02 MED ORDER — FUROSEMIDE 40 MG PO TABS: 40.0000 mg | ORAL_TABLET | Freq: Every day | ORAL | 6 refills | Status: AC | PRN

## 2024-06-02 MED ORDER — CLOPIDOGREL BISULFATE 75 MG PO TABS: 75.0000 mg | ORAL_TABLET | Freq: Every day | ORAL | 6 refills | Status: AC

## 2024-06-02 MED ORDER — NITROGLYCERIN 0.4 MG SL SUBL: 0.4000 mg | SUBLINGUAL_TABLET | SUBLINGUAL | 3 refills | Status: AC | PRN

## 2024-06-02 NOTE — Telephone Encounter (Signed)
 Checking percert on the following   30 day preventice monitor - stroke

## 2024-06-02 NOTE — Patient Instructions (Addendum)
 Medication Instructions:   Plavix , Atorvastatin , Nitroglycerin  refilled today  Continue all current medications.   Labwork:  none  Testing/Procedures:  Your physician has recommended that you wear a 30 day event monitor. Event monitors are medical devices that record the heart's electrical activity. Doctors most often us  these monitors to diagnose arrhythmias. Arrhythmias are problems with the speed or rhythm of the heartbeat. The monitor is a small, portable device. You can wear one while you do your normal daily activities. This is usually used to diagnose what is causing palpitations/syncope (passing out).  Office will contact with results via phone, letter or mychart.     Follow-Up:  3 months   Any Other Special Instructions Will Be Listed Below (If Applicable).   If you need a refill on your cardiac medications before your next appointment, please call your pharmacy.

## 2024-06-02 NOTE — Progress Notes (Signed)
 Clinical Summary Mr. Edmonston is a 58 y.o.male seen today for follow up of the following medical problems.      1. CAD - admit 01/2015 with lateral STEMI, received DES to D1. There was a 70% ramus and 40% mid RCA that was medically managed - 01/2015 echo LVEF 40%, lateral wall hypokinesis - no beta blocker due to history of sinus pause and bradycardia. No ACE-I given history of severe angioedema requiring intubation, no ARB due to risk of cross reactivity    04/2017 DES to ramus -07/2018 nuclear stress prior infarct, no current ischemia -07/2022 nuclear stress:  Findings are consistent with prior anterolatera/lateral/inferolateral myocardial infarction. There is no current ischemia.  - 07/2022 echo: LVEF 50-55%, no WMAs, indet diastolic, normal RV 03/2024 LVEF 45-50%, indet diastolic function, normal RV function   - no chest pains. 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 This was during admission with CVA.    3. SOB/COPD - 07/2015 PFTs: mild to mod ventilatory defect with small airway obstruction.   - 07/2015 CT PE no PE, though suboptimal study - 07/2015 CXR no acute process.   PFTs with mild to mod vent defect, ABGs showed borderline resting hypoxemia.     - followed by Dr Darlean     3. Hyperlipidemia 05/2024 TC 110 TG 105 HDL 29 LDL 61 - atorva increased to 80mg  due to recent admission   4. Syncope - nocturnal bradycardia noted on previous monitors, no significant daytime episodes - has loop recorder, followed by EP - no beta blockers due to sinus pauses.        5. HTN -he is compliant with meds       6. OSA  -using cpap machine.     7.Left axillary pseudoaneurysm secondary to trauma/fall - 03/2024 open repair by vascular, followed by  stenting.   8. CVA - admit 03/2024 - on ASA and plavix  per neuro - recs were for 30 day monitor    SH: within last few years his son overdosed, passed away Girlfriend passed on Thanksgiving. THey lived together, he is working on moving.  Currently in low income apartment Past Medical History:  Diagnosis Date   ACE inhibitor-aggravated angioedema    Allergy     Anemia    Angio-edema    Anxiety    Arthritis    Asthma    hip replacement   Back pain    Bradycardia 04/28/2017   Bulging of cervical intervertebral disc    CAD (coronary artery disease)    lateral STEMI 02/06/2015 00% D1 occlusion treated with Promus Premier 2.5 mm x 16 mm DES, 70% ramus stenosis, 40% mid RCA stenosis, 45% distal RCA stenosis, EF 45-50%   CHF (congestive heart failure) (HCC)    COPD (chronic obstructive pulmonary disease) (HCC)    Depression    Diabetes mellitus without complication (HCC)    Difficult intubation    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   Dry eye    Dyspnea    Early satiety 09/23/2016   Fatty liver    GERD (gastroesophageal reflux disease)    HCAP (healthcare-associated pneumonia) 05/15/2017   Headache    Heart murmur    Hip pain    Hyperlipidemia  Hypertension    Lobar pneumonia 05/15/2017   Melena 08/04/2018   MI (myocardial infarction) [facility])    Myocardial infarction [facility])    Neck pain    Non-ST elevation (NSTEMI) myocardial infarction (HCC) 04/27/2017   NSTEMI (non-ST elevated myocardial infarction) (HCC) 04/26/2017   Otitis media    Pleurisy    Pneumonia due to COVID-19 virus 10/07/2019   Rectal bleeding 11/08/2018   Right shoulder pain 03/20/2020   Sinus pause    9 sec sinus pause on telemetry after started on coreg  after MI, avoid AV nodal blocking agent   Sleep apnea    uses a cpap   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 Longest one 9 sec. No AV nodal agent   Zestril  [Lisinopril ] Anaphylaxis, Shortness Of Breath and Swelling    Angioedema, required intubation and mechanical ventilation   Chantix  [Varenicline ] Other (See Comments)    Nightmares Insomnia    Amoxil  [Amoxicillin ] Nausea Only     Current Outpatient Medications  Medication Sig Dispense Refill   acetaminophen  (TYLENOL ) 500 MG tablet Take 2 tablets (1,000 mg total) by mouth every 8 (eight) hours as needed for mild pain or fever.     amLODipine  (NORVASC ) 10 MG tablet Take 1 tablet (10 mg total) by mouth daily. 90 tablet 1   Ascorbic Acid  (VITAMIN C  PO) Take 1 tablet by mouth daily.     aspirin  EC 81 MG tablet Take 1 tablet (81 mg total) by mouth daily. Swallow whole. 30 tablet 12   atorvastatin  (LIPITOR ) 80 MG tablet Take 1 tablet (80 mg total) by mouth daily. 30 tablet 2   Cholecalciferol  (VITAMIN D -3 PO) Take 1 capsule by mouth daily.     citalopram  (CELEXA ) 20 MG tablet Take 1 tablet (20 mg total) by mouth daily. 90 tablet 1   clopidogrel  (PLAVIX ) 75 MG tablet Take 1 tablet (75 mg total) by mouth daily. 30 tablet 0   dapagliflozin  propanediol (FARXIGA ) 10 MG TABS tablet Take 1 tablet (10 mg total) by mouth daily. 90 tablet 0   Dulaglutide  (TRULICITY ) 1.5 MG/0.5ML SOAJ Inject 1.5 mg into the skin once a week. 6 mL 0   ezetimibe  (ZETIA ) 10 MG tablet TAKE ONE TABLET BY MOUTH DAILY 90 tablet 1   Ferrous Sulfate  (IRON PO) Take 1 tablet by mouth daily.     fluconazole  (DIFLUCAN ) 150 MG tablet Take 1 tablet (150 mg total) by mouth daily. 5 tablet 0   furosemide  (LASIX ) 40 MG tablet Take 40 mg by mouth daily as needed (excessive swelling).     MAGNESIUM  PO Take 1 tablet by mouth 2 (two) times daily.      metFORMIN  (GLUCOPHAGE ) 500 MG tablet Take 1 tablet (500 mg total) by mouth 2 (two) times daily with a meal. 180 tablet 0   naproxen  (NAPROSYN ) 375 MG tablet Take 1 tablet (375 mg total) by mouth 2 (two) times daily as needed for moderate pain (pain score 4-6). 20 tablet 0   nitroGLYCERIN  (NITROSTAT ) 0.4 MG SL tablet Place 1 tablet (0.4 mg total) under the tongue every 5 (five) minutes x 3 doses as  needed (if no relief after 3rd dose, proceed to ED or call 911). 25 tablet 3   ondansetron  (ZOFRAN -ODT) 4 MG disintegrating tablet Take 1 tablet (4 mg total) by mouth every 8 (eight) hours as needed for nausea or vomiting. 40 tablet 1   oxyCODONE -acetaminophen  (PERCOCET/ROXICET) 5-325 MG tablet Take 1 tablet by mouth every 4 (four) hours as needed for severe pain (pain score 7-10). 15 tablet 0   pantoprazole  (PROTONIX ) 40 MG tablet TAKE (1) TABLET BY MOUTH TWICE DAILY BEFORE A MEAL. 180 tablet 1   RESTASIS  0.05 % ophthalmic emulsion Place 1 drop into both eyes 2 (two) times daily.     SYMBICORT  160-4.5 MCG/ACT inhaler INHALE 2 PUFFS INTO THE LUNGS FIRST THING IN THE MORNING AND 2 PUFFS ABOUT 12 HOURS LATER. 10.2 g 12   traMADol  (ULTRAM ) 50 MG tablet Take 1-2 tablets (50-100 mg total) by mouth every 8 (eight) hours as needed. 30 tablet 0   No current facility-administered medications for this visit.     Past Surgical History:  Procedure Laterality Date   ARTERY EXPLORATION Left 03/24/2024   Procedure: LEFT ARCH AORTOGRAM, LEFT UPPER EXTREMITY ANGIOGRAM WITH FIRST ORDER CANNULATION, LEFT AXILLARY ANGIOPLASTY AND STENTING, EVACUATION OF LEFT AXILLARY HEMATOMA;  Surgeon: Magda Debby SAILOR, MD;  Location: [facility] OR;  Service: Vascular;  Laterality: Left;   ARTERY REPAIR Left 03/11/2024   Procedure: REPAIR LEFT AXILLARY ARTERY;  Surgeon: Serene Gaile ORN, MD;  Location: [facility] OR;  Service: Vascular;  Laterality: Left;  ARTERIAL REPAIR, LEFT AXILLARY   BIOPSY  10/09/2016   Procedure: BIOPSY;  Surgeon: Lamar CHRISTELLA Hollingshead, MD;  Location: [facility] ENDO SUITE;  Service: Endoscopy;;   BIOPSY  11/28/2019   Procedure: BIOPSY;  Surgeon: Hollingshead Lamar CHRISTELLA, MD;  Location: [facility];  Service: Endoscopy;;   BIOPSY  06/15/2023   Procedure: BIOPSY;  Surgeon: Hollingshead Lamar CHRISTELLA, MD;  Location: [facility];  Service: Endoscopy;;   BRONCHIAL BIOPSY  10/16/2022   Procedure: BRONCHIAL BIOPSIES;  Surgeon: Gladis Leonor CHRISTELLA, MD;  Location: [facility] ENDOSCOPY;  Service: Pulmonary;;   BRONCHIAL NEEDLE ASPIRATION BIOPSY  10/16/2022   Procedure: BRONCHIAL NEEDLE ASPIRATION BIOPSIES;  Surgeon: Gladis Leonor CHRISTELLA, MD;  Location: [facility] ENDOSCOPY;  Service: Pulmonary;;   BRONCHIAL WASHINGS  10/16/2022   Procedure: BRONCHIAL WASHINGS;  Surgeon: Gladis Leonor CHRISTELLA, MD;  Location: [facility] ENDOSCOPY;  Service: Pulmonary;;   CARDIAC CATHETERIZATION N/A 02/06/2015   Procedure: Left Heart Cath and Coronary Angiography;  Surgeon: Alm ORN Clay, MD;  Location: [facility] INVASIVE CV LAB;  Service: Cardiovascular;  Laterality: N/A;   CARDIAC CATHETERIZATION N/A 02/06/2015   Procedure: Coronary Stent Intervention;  Surgeon: Alm ORN Clay, MD;  Location: [facility] INVASIVE CV LAB;  Service: Cardiovascular;  Laterality: N/A;   COLONOSCOPY WITH PROPOFOL  N/A 10/09/2016   Sigmoid and descending colon diverticulosis, four 4-6 mm polyps in sigmoid, one 4 mm polyp in descending. Tubular adenomas and hyperplastic. 5 year surveillance.    COLONOSCOPY WITH PROPOFOL  N/A 11/24/2016   Sigmoid and descending colon diverticulosis, four 4-6 mm polyps in sigmoid, one 4 mm polyp in descending. Tubular adenomas and hyperplastic. 5 year surveillance.    COLONOSCOPY WITH PROPOFOL  N/A 10/18/2021   Carver: nonbleeding internal hemorrhoids, small and large mouth diverticula found in the sigmoid, descending, transverse colon.  A 9 mm sessile polyp was found in the ascending colon that was removed.  4 sessile polyps found in the sigmoid, descending, transverse colon 3 to 5 mm in size, examining  otherwise.  Path revealed tubular adenomas.  Repeat due in 5 years for surveillance.   CORONARY ANGIOPLASTY WITH STENT PLACEMENT  01/2015   CORONARY STENT INTERVENTION N/A 04/27/2017   Procedure: CORONARY STENT INTERVENTION;  Surgeon: Mady Bruckner, MD;  Location: [facility] INVASIVE CV LAB;  Service: Cardiovascular;  Laterality: N/A;   ELECTROPHYSIOLOGY STUDY N/A 06/29/2017   Procedure: ELECTROPHYSIOLOGY STUDY;  Surgeon: Waddell Danelle ORN, MD;  Location: [facility] INVASIVE CV LAB;  Service: Cardiovascular;  Laterality: N/A;   ESOPHAGOGASTRODUODENOSCOPY (EGD) WITH PROPOFOL  N/A 10/09/2016   Dr. Shaaron: LA grade a esophagitis.  Barrett's esophagus, biopsy-proven.  Small hiatal hernia.  EGD February 2019   ESOPHAGOGASTRODUODENOSCOPY (EGD) WITH PROPOFOL  N/A 11/28/2019    salmon-colored esophageal mucosa (Barrett's) small hiatal hernia, portal hypertensive gastropathy, normal duodenum, 3 year surveillance   ESOPHAGOGASTRODUODENOSCOPY (EGD) WITH PROPOFOL  N/A 09/06/2021   three columns of grade 1 varices in distal esophagus, no stigmata of bleeding or red wale signs. Small hiatal hernia. Mild portal gastropathy. Normal duodenum. Repeat upper endoscopy in 1 year for surveillance.   ESOPHAGOGASTRODUODENOSCOPY (EGD) WITH PROPOFOL  N/A 06/15/2023   Procedure: ESOPHAGOGASTRODUODENOSCOPY (EGD) WITH PROPOFOL ;  Surgeon: Shaaron Lamar HERO, MD;  [facility];  Service: Endoscopy;  Laterality: N/A;  830am, asa 3   FIDUCIAL MARKER PLACEMENT  10/16/2022   Procedure: FIDUCIAL MARKER PLACEMENT;  Surgeon: Gladis Leonor HERO, MD;  Location: [facility] ENDOSCOPY;  Service: Pulmonary;;   INCISION / DRAINAGE HAND / FINGER     LEFT HEART CATH AND CORONARY ANGIOGRAPHY N/A 04/27/2017   Procedure: LEFT HEART CATH AND CORONARY ANGIOGRAPHY;  Surgeon: Mady Bruckner, MD;  Location: [facility] INVASIVE CV LAB;  Service: Cardiovascular;  Laterality: N/A;   LOOP RECORDER INSERTION  06/29/2017   Procedure: Loop Recorder Insertion;  Surgeon: Waddell Danelle ORN, MD;  Location: [facility] INVASIVE CV LAB;  Service: Cardiovascular;;   POLYPECTOMY  11/24/2016   Procedure: POLYPECTOMY;  Surgeon: Lamar HERO Shaaron, MD;  [facility];  Service: Endoscopy;;  descending and sigmoid   POLYPECTOMY  10/18/2021   Procedure: POLYPECTOMY;  Surgeon: Cindie Carlin POUR, DO;  Location: [facility];  Service: Endoscopy;;   TOTAL HIP ARTHROPLASTY Right 07/01/2016   TOTAL HIP ARTHROPLASTY Right 07/01/2016   Procedure: RIGHT TOTAL HIP ARTHROPLASTY ANTERIOR APPROACH;  Surgeon: Bruckner CINDERELLA Poli, MD;  Location: [facility] OR;  Service: Orthopedics;  Laterality: Right;   ULTRASOUND GUIDANCE FOR VASCULAR ACCESS Left 03/24/2024   Procedure: ULTRASOUND GUIDANCE, FOR VASCULAR ACCESS;  Surgeon: Magda Debby SAILOR, MD;  Location: [facility] OR;  Service: Vascular;  Laterality: Left;     Allergies  Allergen Reactions   Coreg  [Carvedilol ] Other (See Comments)    Sinus pause on telemetry >3 seconds. Longest one 9 sec. No AV nodal agent   Zestril  [Lisinopril ] Anaphylaxis, Shortness Of Breath and Swelling    Angioedema, required intubation and mechanical ventilation   Chantix  [Varenicline ] Other (See Comments)    Nightmares Insomnia    Amoxil  [Amoxicillin ] Nausea Only      Family History  Problem Relation Age of Onset   Heart attack Father    Stroke Father    Arthritis Father    Heart disease Father    Cancer Mother        ???   Arthritis Mother    Heart disease Brother 42       died in sleep   Early death Brother    Diabetes Maternal Uncle    Alzheimer's disease Maternal Grandmother      Social History Mr. Eschbach  reports that he has been smoking cigarettes. He has a 46 pack-year smoking history. He has never been exposed to tobacco smoke. He has quit using smokeless tobacco.  His smokeless tobacco use included chew. Mr. Cubbage reports current alcohol  use.    Physical Examination Today's Vitals   06/02/24 1030  BP: 126/74  Pulse: 76  SpO2: 96%  Weight: 171 lb 6.4  oz (77.7 kg)  Height: 5' 5 (1.651 m)   Body mass index is 28.52 kg/m.  Gen: resting comfortably, no acute distress HEENT: no scleral icterus, pupils equal round and reactive, no palptable cervical adenopathy,  CV: RRR, no m/rg, no jvd Resp: Clear to auscultation bilaterally GI: abdomen is soft, non-tender, non-distended, normal bowel sounds, no hepatosplenomegaly MSK: extremities are warm, no edema.  Skin: warm, no rash Neuro:  no focal deficits Psych: appropriate affect   Diagnostic Studies  01/2015 cath 1st Diag lesion, 100% stenosed. A Promus Premier 2.5 mm x 16 mm drug-eluting stent was placed. There is a 0% residual stenosis post intervention. Ramus lesion, 70% stenosed. Mid RCA lesion, 40% stenosed. Dist RCA lesion, 45% stenosed. Mild to moderately reduced LVEF with anterolateral and apical hypokinesis and elevated LVEDP   Abnormal anatomy with equal sized Diagonal and LAD Atiyah Bauer but extensive RCA. Successful PCI of the First Diagonal Dolly Harbach.   Recommendations: Standard post radial cath/PCI TR band removal Dual independent therapy for minimum one year. Check 2-D echocardiogram to better assess EF Add statin and beta blocker. Smoking cessation counseling. Cardiac Rehabilitation And Case Management Consultation If hemodynamic stable, would consider fast-track discharge Would consider noninvasive evaluation of the Ramus Intermedius lesion in the absence of any recurrent anginal pain.   01/2015 echo Study Conclusions  - Left ventricle: Moderately severe hypokinesis of mid   anterolateral segment, apical lateral segment, mid/apical   anterior segments. EF is 40%. The cavity size was normal. Wall   thickness was increased in a pattern of mild LVH. - Aortic valve: Sclerosis without stenosis. There was no   significant regurgitation. - Right ventricle: The cavity size was normal. Systolic function   was normal.   07/2015 CT PE IMPRESSION: 1. Suboptimal contrast bolus  timing in the pulmonary arterial tree, poor in the lobar vessels other than the right lower lobe. No central pulmonary embolus. If there is continued suspicion of acute PE nuclear medicine V/Q scan may be most helpful. 2. Situs abnormality in the chest with right side aortic arch and descending aorta. Mirror image branching. Other visible situs including cardiac and upper abdominal situs appear normal. 3. Calcified Coronary artery atherosclerosis. Hepatomegaly with fatty liver disease.   07/2015 PFTs: mild to mod ventilatory defect with small airway obstruction, moderately reduced DLCO.    07/2018 nuclear stress There was no ST segment deviation noted during stress. Defect 1: There is a medium defect of moderate severity present in the mid anterior, mid anterolateral and apical anterior location. Findings consistent with prior myocardial infarction. This is an intermediate risk study. Nuclear stress EF: 53%.     Assessment and Plan  1. CAD/ICM -no symptoms - continue current meds   2. HFimpEF - mild variation in LVEF over the years, most recent echo during admission with CVA LVEF 45-50%. Perhaps stress induced decline - would repeat echo 3 months     3. Hyperlipideima - continue current meds   4. HTN - he is at goal, continue current meds  5. CVA - neuro has asked for 30 day monitor, we will arrange  Dorn PHEBE Ross, M.D

## 2024-06-06 ENCOUNTER — Ambulatory Visit: Attending: Internal Medicine | Admitting: Physical Therapy

## 2024-06-06 ENCOUNTER — Encounter: Payer: Self-pay | Admitting: Physical Therapy

## 2024-06-06 DIAGNOSIS — M6281 Muscle weakness (generalized): Secondary | ICD-10-CM | POA: Insufficient documentation

## 2024-06-06 DIAGNOSIS — M25512 Pain in left shoulder: Secondary | ICD-10-CM | POA: Diagnosis present

## 2024-06-06 NOTE — Therapy (Signed)
 OUTPATIENT PHYSICAL THERAPY TREATMENT   Patient Name: Randy Hebert MRN: [id number] DOB:04-30-1966, 58 y.o., male Today's Date: 06/06/2024  END OF SESSION:  PT End of Session - 06/06/24 0853     Visit Number 9    Number of Visits 20    Date for Recertification  06/23/24    Progress Note Due on Visit 10    PT Start Time 0840    PT Stop Time 0926    PT Time Calculation (min) 46 min    Activity Tolerance Patient tolerated treatment well    Behavior During Therapy [facility] for tasks assessed/performed             Past Medical History:  Diagnosis Date   ACE inhibitor-aggravated angioedema    Allergy     Anemia    Angio-edema    Anxiety    Arthritis    Asthma    hip replacement   Back pain    Bradycardia 04/28/2017   Bulging of cervical intervertebral disc    CAD (coronary artery disease)    lateral STEMI 02/06/2015 00% D1 occlusion treated with Promus Premier 2.5 mm x 16 mm DES, 70% ramus stenosis, 40% mid RCA stenosis, 45% distal RCA stenosis, EF 45-50%   CHF (congestive heart failure) (HCC)    COPD (chronic obstructive pulmonary disease) (HCC)    Depression    Diabetes mellitus without complication (HCC)    Difficult intubation    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   Dry eye    Dyspnea    Early satiety 09/23/2016   Fatty liver    GERD (gastroesophageal reflux disease)    HCAP (healthcare-associated pneumonia) 05/15/2017   Headache    Heart murmur    Hip pain    Hyperlipidemia    Hypertension    Lobar pneumonia 05/15/2017   Melena 08/04/2018   MI (myocardial infarction) (HCC)    Myocardial infarction (HCC)    Neck pain    Non-ST elevation (NSTEMI) myocardial infarction (HCC) 04/27/2017   NSTEMI (non-ST elevated myocardial infarction) (HCC) 04/26/2017   Otitis media    Pleurisy    Pneumonia due to COVID-19 virus 10/07/2019   Rectal bleeding 11/08/2018   Right shoulder pain 03/20/2020   Sinus pause    9 sec sinus pause on telemetry after  started on coreg  after MI, avoid AV nodal blocking agent   Sleep apnea    uses a cpap   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   STEMI (ST elevation myocardial infarction) (HCC) 05/31/2015   Substance abuse (HCC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   Syncope 06/29/2017   Syncope and collapse 05/15/2017   Transaminitis 08/04/2018   Unilateral primary osteoarthritis, right hip 07/01/2016   Past Surgical History:  Procedure Laterality Date   ARTERY EXPLORATION Left 03/24/2024   Procedure: LEFT ARCH AORTOGRAM, LEFT UPPER EXTREMITY ANGIOGRAM WITH FIRST ORDER CANNULATION, LEFT AXILLARY ANGIOPLASTY AND STENTING, EVACUATION OF LEFT AXILLARY HEMATOMA;  Surgeon: Magda Debby SAILOR, MD;  Location: [facility] OR;  Service: Vascular;  Laterality: Left;   ARTERY REPAIR Left 03/11/2024   Procedure: REPAIR LEFT AXILLARY ARTERY;  Surgeon: Serene Gaile ORN, MD;  Location: [facility] OR;  Service: Vascular;  Laterality: Left;  ARTERIAL REPAIR, LEFT AXILLARY   BIOPSY  10/09/2016   Procedure: BIOPSY;  Surgeon: Lamar CHRISTELLA Hollingshead, MD;  Location: [facility];  Service: Endoscopy;;   BIOPSY  11/28/2019   Procedure: BIOPSY;  Surgeon: Hollingshead Lamar CHRISTELLA, MD;  Location: [facility];  Service: Endoscopy;;   BIOPSY  06/15/2023   Procedure: BIOPSY;  Surgeon: Shaaron Lamar HERO, MD;  Location: [facility];  Service: Endoscopy;;   BRONCHIAL BIOPSY  10/16/2022   Procedure: BRONCHIAL BIOPSIES;  Surgeon: Gladis Leonor HERO, MD;  Location: [facility] ENDOSCOPY;  Service: Pulmonary;;   BRONCHIAL NEEDLE ASPIRATION BIOPSY  10/16/2022   Procedure: BRONCHIAL NEEDLE ASPIRATION BIOPSIES;  Surgeon: Gladis Leonor HERO, MD;  Location: [facility] ENDOSCOPY;  Service: Pulmonary;;   BRONCHIAL WASHINGS  10/16/2022   Procedure: BRONCHIAL WASHINGS;  Surgeon: Gladis Leonor HERO, MD;  Location: [facility] ENDOSCOPY;  Service: Pulmonary;;   CARDIAC CATHETERIZATION N/A 02/06/2015   Procedure: Left Heart Cath and Coronary Angiography;  Surgeon: Alm LELON Clay, MD;  Location: [facility] INVASIVE CV LAB;   Service: Cardiovascular;  Laterality: N/A;   CARDIAC CATHETERIZATION N/A 02/06/2015   Procedure: Coronary Stent Intervention;  Surgeon: Alm LELON Clay, MD;  Location: [facility] INVASIVE CV LAB;  Service: Cardiovascular;  Laterality: N/A;   COLONOSCOPY WITH PROPOFOL  N/A 10/09/2016   Sigmoid and descending colon diverticulosis, four 4-6 mm polyps in sigmoid, one 4 mm polyp in descending. Tubular adenomas and hyperplastic. 5 year surveillance.    COLONOSCOPY WITH PROPOFOL  N/A 11/24/2016   Sigmoid and descending colon diverticulosis, four 4-6 mm polyps in sigmoid, one 4 mm polyp in descending. Tubular adenomas and hyperplastic. 5 year surveillance.    COLONOSCOPY WITH PROPOFOL  N/A 10/18/2021   Carver: nonbleeding internal hemorrhoids, small and large mouth diverticula found in the sigmoid, descending, transverse colon.  A 9 mm sessile polyp was found in the ascending colon that was removed.  4 sessile polyps found in the sigmoid, descending, transverse colon 3 to 5 mm in size, examining otherwise.  Path revealed tubular adenomas.  Repeat due in 5 years for surveillance.   CORONARY ANGIOPLASTY WITH STENT PLACEMENT  01/2015   CORONARY STENT INTERVENTION N/A 04/27/2017   Procedure: CORONARY STENT INTERVENTION;  Surgeon: Mady Bruckner, MD;  Location: [facility] INVASIVE CV LAB;  Service: Cardiovascular;  Laterality: N/A;   ELECTROPHYSIOLOGY STUDY N/A 06/29/2017   Procedure: ELECTROPHYSIOLOGY STUDY;  Surgeon: Waddell Danelle LELON, MD;  Location: [facility] INVASIVE CV LAB;  Service: Cardiovascular;  Laterality: N/A;   ESOPHAGOGASTRODUODENOSCOPY (EGD) WITH PROPOFOL  N/A 10/09/2016   Dr. Shaaron: LA grade a esophagitis.  Barrett's esophagus, biopsy-proven.  Small hiatal hernia.  EGD February 2019   ESOPHAGOGASTRODUODENOSCOPY (EGD) WITH PROPOFOL  N/A 11/28/2019    salmon-colored esophageal mucosa (Barrett's) small hiatal hernia, portal hypertensive gastropathy, normal duodenum, 3 year surveillance   ESOPHAGOGASTRODUODENOSCOPY (EGD) WITH  PROPOFOL  N/A 09/06/2021   three columns of grade 1 varices in distal esophagus, no stigmata of bleeding or red wale signs. Small hiatal hernia. Mild portal gastropathy. Normal duodenum. Repeat upper endoscopy in 1 year for surveillance.   ESOPHAGOGASTRODUODENOSCOPY (EGD) WITH PROPOFOL  N/A 06/15/2023   Procedure: ESOPHAGOGASTRODUODENOSCOPY (EGD) WITH PROPOFOL ;  Surgeon: Shaaron Lamar HERO, MD;  [facility]: [facility];  Service: Endoscopy;  Laterality: N/A;  830am, asa 3   FIDUCIAL MARKER PLACEMENT  10/16/2022   Procedure: FIDUCIAL MARKER PLACEMENT;  Surgeon: Gladis Leonor HERO, MD;  Location: [facility] ENDOSCOPY;  Service: Pulmonary;;   INCISION / DRAINAGE HAND / FINGER     LEFT HEART CATH AND CORONARY ANGIOGRAPHY N/A 04/27/2017   Procedure: LEFT HEART CATH AND CORONARY ANGIOGRAPHY;  Surgeon: Mady Bruckner, MD;  Location: [facility] INVASIVE CV LAB;  Service: Cardiovascular;  Laterality: N/A;   LOOP RECORDER INSERTION  06/29/2017   Procedure: Loop Recorder Insertion;  Surgeon: Waddell Danelle LELON, MD;  Location:  [facility] INVASIVE CV LAB;  Service: Cardiovascular;;   POLYPECTOMY  11/24/2016   Procedure: POLYPECTOMY;  Surgeon: Lamar CHRISTELLA Hollingshead, MD;  [facility]: [facility];  Service: Endoscopy;;  descending and sigmoid   POLYPECTOMY  10/18/2021   Procedure: POLYPECTOMY;  Surgeon: Cindie Carlin POUR, DO;  Location: [facility];  Service: Endoscopy;;   TOTAL HIP ARTHROPLASTY Right 07/01/2016   TOTAL HIP ARTHROPLASTY Right 07/01/2016   Procedure: RIGHT TOTAL HIP ARTHROPLASTY ANTERIOR APPROACH;  Surgeon: Lonni CINDERELLA Poli, MD;  Location: [facility] OR;  Service: Orthopedics;  Laterality: Right;   ULTRASOUND GUIDANCE FOR VASCULAR ACCESS Left 03/24/2024   Procedure: ULTRASOUND GUIDANCE, FOR VASCULAR ACCESS;  Surgeon: Magda Debby SAILOR, MD;  Location: [facility] OR;  Service: Vascular;  Laterality: Left;   Patient Active Problem List   Diagnosis Date Noted   Pain of left upper extremity 04/24/2024   Acute postoperative anemia due to greater  than expected blood loss 03/26/2024   Obesity, class 1 03/26/2024   Aneurysm of axillary artery 03/24/2024   Axillary artery injury 03/12/2024   Acute CVA (cerebrovascular accident) (HCC) 03/02/2024   Essential hypertension 03/02/2024   Dyslipidemia 03/02/2024   Type 2 diabetes mellitus with hyperlipidemia (HCC) 03/02/2024   Erectile dysfunction 02/24/2023   Chronic eczematous otitis externa of both ears 02/05/2023   Tobacco abuse 12/25/2022   Malignant neoplasm of upper lobe of right lung (HCC) 09/25/2022   Cervical spondylosis with myelopathy 08/01/2022   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   DDD (degenerative disc disease), cervical 05/21/2022   Osteoarthritis of both hands 05/19/2022   History of GI bleed 11/13/2021   Insomnia 09/13/2021   Cirrhosis of liver (HCC) 11/08/2018   Barrett's esophagus 08/04/2018   Vitamin D  deficiency 06/30/2018   Alcohol  abuse 06/30/2018   DM type 2 causing vascular disease (HCC) 08/17/2017   Chronic combined systolic and diastolic CHF (congestive heart failure) (HCC) 05/15/2017   OSA (obstructive sleep apnea) 04/28/2017   Depression with anxiety 04/15/2017   GERD (gastroesophageal reflux disease) 09/23/2016   Asthmatic bronchitis , chronic (HCC) 08/27/2015   Mixed hyperlipidemia 08/27/2015   Class 1 obesity due to excess calories with serious comorbidity and body mass index (BMI) of 32.0 to 32.9 in adult 08/27/2015   CAD (coronary artery disease) 05/31/2015    PCP: Cook, Jayce G, DO   REFERRING PROVIDER: Noralee Elidia Sieving*   REFERRING DIAG: left axillary pseudoaneurysm   Rationale for Evaluation and Treatment:  Rehabiliation  THERAPY DIAG:  Acute pain of left shoulder  Muscle weakness (generalized)  ONSET DATE: 03/11/24   SUBJECTIVE:  SUBJECTIVE  STATEMENT: Pt relays he had NCV test recently, does not know any results from this. He has been trying to use his left arm more, does also complain of pain in his right shoulder now.   PERTINENT HISTORY:  past medical history of diabetes mellitus type 2, hypertension, CAD, liver cirrhosis, anxiety, depression, OSA, CVA w/ left-sided weakness who had a recent hospitalization 7/11 to 03/19/24 post traumatic left shoulder dislocation, complicated with left axillary hematoma with left axillary pseudoaneurysm. On 07/12 he underwent open exposure and repair of the left axillary pseudoaneurysm and evacuation of left axillary hematoma.   PAIN:  NPRS scale: 5/10 Pain location:left shoulder/axillary, left forearm and wrist Pain description: constant, achy, sharp Aggravating factors: any use of this left arm. Relieving factors: rest, meds   PRECAUTIONS: ,  Shoulder  RED FLAGS: None   WEIGHT BEARING RESTRICTIONS:  Not anymore  FALLS:  Has patient fallen in last 6 months? Yes. Number of falls 1   OCCUPATION:  disabled  PLOF:  Independent with basic ADLs  PATIENT GOALS:  Reduce pain and get his arm back to normal  OBJECTIVE:  Note: Objective measures were completed at Evaluation unless otherwise noted.  DIAGNOSTIC FINDINGS:  Chest CT IMPRESSION: 1. Enlarging left axillary hematoma with arterial contrast extravasation from the posterior wall of the left axillary artery into a 1.5 x 1.8 cm pseudoaneurysm, with additional 1.8 x 1.1 cm pocket of contrast more posteriorly within the collection. 2. Increased heterogeneous hematoma in the left subscapularis region measuring 7.3 x 4.8 cm, but no focal contrast is seen within this. 3. Emphysema. 4. Right apical fiducial markers and post XRT changes, with treated nodule just above the fiducial markers today measuring 8 mm, previously 9 mm. 5. Aortic and coronary artery atherosclerosis.  Right aortic arch. 6. Enlarged pulmonary trunk 3.3  cm indicating arterial hypertension. 7. Cirrhosis and splenomegaly.  Prominent patent portal vein 1.7 cm. 8. Critical Value/emergent results were called by telephone at the time of interpretation on 03/24/2024 at 5:18 am to provider [facility] , who verbally acknowledged these results.  Shoulder XR IMPRESSION: No acute bony abnormality.   Postoperative changes in the left axilla.  No imaging for left elbow/wrist  PATIENT SURVEYS:  Patient-Specific Activity Scoring Scheme  0 represents "unable to perform." 10 represents "able to perform at prior level. 0 1 2 3 4 5 6 7 8 9  10 (Date and Score)   Activity Eval    1. Raising left arm  0    2. Putting on and off shirt 1    3.     4.    5.    Score 0.5/10    Total score = sum of the activity scores/number of activities Minimum detectable change (90%CI) for average score = 2 points Minimum detectable change (90%CI) for single activity score = 3 points     EDEMA:  Yes: moderate edema noted in left upper extremity from shoulder to hand  Observation:  2 incisions noted, one at proximal chest, one at axilla, both incisions closed with stitches and appear closed without any drainage or signs of infection at the moment. Dressing was removed and changed for him at eval.     UPPER EXTREMITY ROM:  AROM/Passive ROM Left eval Left 04/13/24 Left 05/24/24  Shoulder flexion /50 /120 30/150  Shoulder extension     Shoulder abduction /50 /100 20/140  Shoulder adduction     Shoulder extension     Shoulder internal rotation /50  Shoulder external rotation /40 /70   Elbow flexion     Elbow extension     Wrist flexion     Wrist extension     Wrist ulnar deviation     Wrist radial deviation     Wrist pronation     Wrist supination      (Blank rows = not tested)   UPPER EXTREMITY MMT:  MMT Left eval Left 05/24/24  Shoulder flexion 1 2  Shoulder extension    Shoulder abduction 1 2  Shoulder adduction    Shoulder  extension    Shoulder internal rotation 1 2  Shoulder external rotation 1 2  Middle trapezius    Lower trapezius    Elbow flexion 1 3  Elbow extension 2 3  Wrist flexion    Wrist extension    Wrist ulnar deviation    Wrist radial deviation    Wrist pronation    Wrist supination    Grip strength     (Blank rows = not tested)                                                                                                                              TREATMENT DATE:  06/06/24 Theractivity Nu step L1 X 10 min, working on shoulder push/pull as well as grip with left hand, needs to have had wrapped to help with grip Standing p ball roll outs on mat table for AAROM flexion, abduction, circles CW & CCW X 15 reps each for reaching Gripping towel X 20 Blaze pods 4 pods, using UE to turn off working on reaching and hand control, pods placed on high mat, performed 30 sec X 3 with 30 sec rest in between  Therex: Supine shoulder flexion eccentrics with PT assist x15 Supine chest press AROM X 10 Supine shoulder ER against manual PT resistance X 10 Supine shoulder IR against manual PT resistance X 10 Supine shoulder AAROM hands clasped X 15 Standing elbow AROM/bicep curl X 15 Standing scapular retractions X 15 AROM  Cold pack X 8 min to bilat shoulders, not included in treatment time.    PATIENT EDUCATION: Education details: HEP, PT plan of care, selfcare Person educated: Patient Education method: Explanation, Demonstration, Verbal cues, and Handouts Education comprehension: verbalized understanding, further education recommended   HOME EXERCISE PROGRAM: Access Code: [email or web address] URL: [email or web address] Date: 03/31/2024 Prepared by: Redell Moose  Exercises - Circular Shoulder Pendulum with Table Support  - 2-3 x daily - 7 x weekly - 1 sets - 15 reps - Seated Shoulder Abduction Towel Slide at Table Top  - 2-3 x daily - 6 x weekly - 1 sets - 10 reps - 5 sec hold -  Seated Shoulder Flexion Towel Slide at Table Top  - 2-3 x daily - 6 x weekly - 1 sets - 10 reps - 5 sec hold - Seated Shoulder External Rotation PROM on Table  - 2-3 x daily - 6 x  weekly - 1 sets - 10 reps - 5 sec hold - Seated Gripping Towel  - 2-3 x daily - 6 x weekly - 1 sets - 20 reps - Standing Elbow Flexion Extension AROM  - 2-3 x daily - 6 x weekly - 1 sets - 10 reps  ASSESSMENT:  CLINICAL IMPRESSION: He had NVC test but does not know any results yet. He continues to have limited function of right UE. His elbow function is improving but his shoulder function and hand function remain very limited.   OBJECTIVE IMPAIRMENTS: decreased activity tolerance for ADL's, difficulty walking, decreased balance, decreased endurance, decreased mobility, decreased ROM, decreased strength, impaired flexibility, impaired UE use, and pain.  ACTIVITY LIMITATIONS: bending, lifting, carry, reaching, locomotion, cleaning, community activity, driving,  PERSONAL FACTORS: see above PMH  also affecting patient's functional outcome.  REHAB POTENTIAL: Good  CLINICAL DECISION MAKING: Evolving/moderate complexity  EVALUATION COMPLEXITY: Moderate    GOALS: Short term PT Goals Target date: 04/28/2024   Pt will be I and compliant with HEP. Baseline:  Goal status: MET 04/18/24 Pt will decrease pain by 25% overall Baseline: Goal status: MET 04/18/24  Long term PT goals Target date:06/23/2024  Pt will improve right shoulder and elbow AROM to [facility] to improve functional mobility Baseline: Goal status: ongoing 9/22 Pt will improve  right arm strength to at least 4+/5 MMT to improve functional strength Baseline: Goal status: ongoing 9/22 Pt will improve Patient specific functional scale (PSFS) to at least 7/10 to show improved function level Baseline: Goal status: ongoing 9/22 Pt will reduce pain to overall less than 3/10 with usual activity Baseline: Goal status: ongoing 9/22 PLAN: PT FREQUENCY: 1-3  times per week   PT DURATION: 6-8 weeks  PLANNED INTERVENTIONS (unless contraindicated):  97110-Therapeutic exercises, 97530- Therapeutic activity, 97112- Neuromuscular re-education, 97535- Self Care, [id number]- Manual therapy, [id number]- Electrical stimulation (unattended), 97016- Vasopneumatic device, [id number]- Ultrasound, and 97033- Ionotophoresis 4mg /ml Dexamethasone   PLAN FOR NEXT SESSION: 10th visit progress note next visit.   Redell JONELLE Moose, PT,DPT 06/06/2024, 9:16 AM

## 2024-06-08 ENCOUNTER — Ambulatory Visit: Admitting: "Endocrinology

## 2024-06-08 ENCOUNTER — Encounter: Payer: Self-pay | Admitting: "Endocrinology

## 2024-06-08 VITALS — BP 122/72 | HR 84 | Ht 65.0 in | Wt 174.4 lb

## 2024-06-08 DIAGNOSIS — Z7984 Long term (current) use of oral hypoglycemic drugs: Secondary | ICD-10-CM

## 2024-06-08 DIAGNOSIS — E782 Mixed hyperlipidemia: Secondary | ICD-10-CM

## 2024-06-08 DIAGNOSIS — F17200 Nicotine dependence, unspecified, uncomplicated: Secondary | ICD-10-CM | POA: Diagnosis not present

## 2024-06-08 DIAGNOSIS — I10 Essential (primary) hypertension: Secondary | ICD-10-CM

## 2024-06-08 DIAGNOSIS — E1159 Type 2 diabetes mellitus with other circulatory complications: Secondary | ICD-10-CM | POA: Diagnosis not present

## 2024-06-08 DIAGNOSIS — Z7985 Long-term (current) use of injectable non-insulin antidiabetic drugs: Secondary | ICD-10-CM

## 2024-06-08 NOTE — Progress Notes (Signed)
 06/08/2024, 2:03 PM      Endocrinology follow-up note    Subjective:    Patient ID: Randy Hebert, male    DOB: 05/04/1966.  Randy Hebert is being seen in  follow-up for management of currently uncontrolled symptomatic type 2 diabetes, hyperlipidemia, hypertension. PMD:  Cook, Jayce G, DO.   Past Medical History:  Diagnosis Date   ACE inhibitor-aggravated angioedema    Allergy     Anemia    Angio-edema    Anxiety    Arthritis    Asthma    hip replacement   Back pain    Bradycardia 04/28/2017   Bulging of cervical intervertebral disc    CAD (coronary artery disease)    lateral STEMI 02/06/2015 00% D1 occlusion treated with Promus Premier 2.5 mm x 16 mm DES, 70% ramus stenosis, 40% mid RCA stenosis, 45% distal RCA stenosis, EF 45-50%   CHF (congestive heart failure) (HCC)    COPD (chronic obstructive pulmonary disease) (HCC)    Depression    Diabetes mellitus without complication (HCC)    Difficult intubation    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   Dry eye    Dyspnea    Early satiety 09/23/2016   Fatty liver    GERD (gastroesophageal reflux disease)    HCAP (healthcare-associated pneumonia) 05/15/2017   Headache    Heart murmur    Hip pain    Hyperlipidemia    Hypertension    Lobar pneumonia 05/15/2017   Melena 08/04/2018   MI (myocardial infarction) [facility])    Myocardial infarction (HCC)    Neck pain    Non-ST elevation (NSTEMI) myocardial infarction (HCC) 04/27/2017   NSTEMI (non-ST elevated myocardial infarction) (HCC) 04/26/2017   Otitis media    Pleurisy    Pneumonia due to COVID-19 virus 10/07/2019   Rectal bleeding 11/08/2018   Right shoulder pain 03/20/2020   Sinus pause    9 sec sinus pause on telemetry after started on coreg  after MI, avoid AV nodal blocking agent   Sleep apnea    uses a cpap   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   STEMI (ST elevation myocardial infarction) (HCC)  05/31/2015   Substance abuse (HCC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   Syncope 06/29/2017   Syncope and collapse 05/15/2017   Transaminitis 08/04/2018   Unilateral primary osteoarthritis, right hip 07/01/2016   Past Surgical History:  Procedure Laterality Date   ARTERY EXPLORATION Left 03/24/2024   Procedure: LEFT ARCH AORTOGRAM, LEFT UPPER EXTREMITY ANGIOGRAM WITH FIRST ORDER CANNULATION, LEFT AXILLARY ANGIOPLASTY AND STENTING, EVACUATION OF LEFT AXILLARY HEMATOMA;  Surgeon: Magda Debby SAILOR, MD;  Location: [facility] OR;  Service: Vascular;  Laterality: Left;   ARTERY REPAIR Left 03/11/2024   Procedure: REPAIR LEFT AXILLARY ARTERY;  Surgeon: Serene Gaile ORN, MD;  Location: [facility] OR;  Service: Vascular;  Laterality: Left;  ARTERIAL REPAIR, LEFT AXILLARY   BIOPSY  10/09/2016   Procedure: BIOPSY;  Surgeon: Lamar CHRISTELLA Hollingshead, MD;  Location: [facility] ENDO [facility];  Service: Endoscopy;;   BIOPSY  11/28/2019   Procedure: BIOPSY;  Surgeon: Hollingshead Lamar CHRISTELLA, MD;  Location: [facility];  Service: Endoscopy;;   BIOPSY  06/15/2023   Procedure: BIOPSY;  Surgeon: Hollingshead Lamar CHRISTELLA, MD;  [facility]: [facility];  Service: Endoscopy;;   BRONCHIAL BIOPSY  10/16/2022   Procedure: BRONCHIAL BIOPSIES;  Surgeon: Gladis Leonor CHRISTELLA, MD;  Location: [facility] ENDOSCOPY;  Service: Pulmonary;;  BRONCHIAL NEEDLE ASPIRATION BIOPSY  10/16/2022   Procedure: BRONCHIAL NEEDLE ASPIRATION BIOPSIES;  Surgeon: Gladis Leonor HERO, MD;  Location: [facility] ENDOSCOPY;  Service: Pulmonary;;   BRONCHIAL WASHINGS  10/16/2022   Procedure: BRONCHIAL WASHINGS;  Surgeon: Gladis Leonor HERO, MD;  Location: [facility] ENDOSCOPY;  Service: Pulmonary;;   CARDIAC CATHETERIZATION N/A 02/06/2015   Procedure: Left Heart Cath and Coronary Angiography;  Surgeon: Alm LELON Clay, MD;  Location: [facility] INVASIVE CV LAB;  Service: Cardiovascular;  Laterality: N/A;   CARDIAC CATHETERIZATION N/A 02/06/2015   Procedure: Coronary Stent Intervention;  Surgeon: Alm LELON Clay, MD;  Location: [facility] INVASIVE CV LAB;  Service:  Cardiovascular;  Laterality: N/A;   COLONOSCOPY WITH PROPOFOL  N/A 10/09/2016   Sigmoid and descending colon diverticulosis, four 4-6 mm polyps in sigmoid, one 4 mm polyp in descending. Tubular adenomas and hyperplastic. 5 year surveillance.    COLONOSCOPY WITH PROPOFOL  N/A 11/24/2016   Sigmoid and descending colon diverticulosis, four 4-6 mm polyps in sigmoid, one 4 mm polyp in descending. Tubular adenomas and hyperplastic. 5 year surveillance.    COLONOSCOPY WITH PROPOFOL  N/A 10/18/2021   Carver: nonbleeding internal hemorrhoids, small and large mouth diverticula found in the sigmoid, descending, transverse colon.  A 9 mm sessile polyp was found in the ascending colon that was removed.  4 sessile polyps found in the sigmoid, descending, transverse colon 3 to 5 mm in size, examining otherwise.  Path revealed tubular adenomas.  Repeat due in 5 years for surveillance.   CORONARY ANGIOPLASTY WITH STENT PLACEMENT  01/2015   CORONARY STENT INTERVENTION N/A 04/27/2017   Procedure: CORONARY STENT INTERVENTION;  Surgeon: Mady Bruckner, MD;  Location: [facility] INVASIVE CV LAB;  Service: Cardiovascular;  Laterality: N/A;   ELECTROPHYSIOLOGY STUDY N/A 06/29/2017   Procedure: ELECTROPHYSIOLOGY STUDY;  Surgeon: Waddell Danelle LELON, MD;  Location: [facility] INVASIVE CV LAB;  Service: Cardiovascular;  Laterality: N/A;   ESOPHAGOGASTRODUODENOSCOPY (EGD) WITH PROPOFOL  N/A 10/09/2016   Dr. Shaaron: LA grade a esophagitis.  Barrett's esophagus, biopsy-proven.  Small hiatal hernia.  EGD February 2019   ESOPHAGOGASTRODUODENOSCOPY (EGD) WITH PROPOFOL  N/A 11/28/2019    salmon-colored esophageal mucosa (Barrett's) small hiatal hernia, portal hypertensive gastropathy, normal duodenum, 3 year surveillance   ESOPHAGOGASTRODUODENOSCOPY (EGD) WITH PROPOFOL  N/A 09/06/2021   three columns of grade 1 varices in distal esophagus, no stigmata of bleeding or red wale signs. Small hiatal hernia. Mild portal gastropathy. Normal duodenum. Repeat upper  endoscopy in 1 year for surveillance.   ESOPHAGOGASTRODUODENOSCOPY (EGD) WITH PROPOFOL  N/A 06/15/2023   Procedure: ESOPHAGOGASTRODUODENOSCOPY (EGD) WITH PROPOFOL ;  Surgeon: Shaaron Lamar HERO, MD;  [facility];  Service: Endoscopy;  Laterality: N/A;  830am, asa 3   FIDUCIAL MARKER PLACEMENT  10/16/2022   Procedure: FIDUCIAL MARKER PLACEMENT;  Surgeon: Gladis Leonor HERO, MD;  Location: [facility] ENDOSCOPY;  Service: Pulmonary;;   INCISION / DRAINAGE HAND / FINGER     LEFT HEART CATH AND CORONARY ANGIOGRAPHY N/A 04/27/2017   Procedure: LEFT HEART CATH AND CORONARY ANGIOGRAPHY;  Surgeon: Mady Bruckner, MD;  Location: [facility] INVASIVE CV LAB;  Service: Cardiovascular;  Laterality: N/A;   LOOP RECORDER INSERTION  06/29/2017   Procedure: Loop Recorder Insertion;  Surgeon: Waddell Danelle LELON, MD;  Location: [facility] INVASIVE CV LAB;  Service: Cardiovascular;;   POLYPECTOMY  11/24/2016   Procedure: POLYPECTOMY;  Surgeon: Lamar HERO Shaaron, MD;  [facility];  Service: Endoscopy;;  descending and sigmoid   POLYPECTOMY  10/18/2021   Procedure: POLYPECTOMY;  Surgeon: Cindie Carlin POUR, DO;  Location:  [facility];  Service: Endoscopy;;   TOTAL HIP ARTHROPLASTY Right 07/01/2016   TOTAL HIP ARTHROPLASTY Right 07/01/2016   Procedure: RIGHT TOTAL HIP ARTHROPLASTY ANTERIOR APPROACH;  Surgeon: Lonni CINDERELLA Poli, MD;  Location: [facility] OR;  Service: Orthopedics;  Laterality: Right;   ULTRASOUND GUIDANCE FOR VASCULAR ACCESS Left 03/24/2024   Procedure: ULTRASOUND GUIDANCE, FOR VASCULAR ACCESS;  Surgeon: Magda Debby SAILOR, MD;  Location: [facility] OR;  Service: Vascular;  Laterality: Left;   Social History   Socioeconomic History   Marital status: Divorced    Spouse name: alice   Number of children: 3   Years of education: 12   Highest education level: Not on file  Occupational History   Occupation: SSI  Tobacco Use   Smoking status: Every Day    Current packs/day: 1.00    Average packs/day: 1 pack/day for 46.0  years (46.0 ttl pk-yrs)    Types: Cigarettes    Passive exposure: Never   Smokeless tobacco: Former    Types: Chew   Tobacco comments:    Smokes 1 pack a day of cigarettes (20 cig)10/16/23  Vaping Use   Vaping status: Former  Substance and Sexual Activity   Alcohol  use: Yes    Comment: rare beer use   Drug use: No    Comment: history of drug use- marijuana, cocaine- quit 2014   Sexual activity: Yes    Partners: Female    Birth control/protection: None  Other Topics Concern   Not on file  Social History Narrative   Lives with girlfriend [name]   3 children, but 1 OD  In 2019.   Grandchildren-2      Two cats: may and princess; and a dog-foxy      Enjoy: spending time with pets, TV, yard work       Diet: eats all food groups   Caffeine: half and half coffee, some tea-half and half decaf   Water : 6-8 cups daily      Wears seat belt   Does not use phone while driving   Smoke detectors at home      Social Drivers of Health   Financial Resource Strain: Low Risk  (01/08/2024)   Overall Financial Resource Strain (CARDIA)    Difficulty of Paying Living Expenses: Not hard at all  Food Insecurity: No Food Insecurity (03/24/2024)   Hunger Vital Sign    Worried About Running Out of Food in the Last Year: Never true    Ran Out of Food in the Last Year: Never true  Transportation Needs: No Transportation Needs (03/24/2024)   PRAPARE - Administrator, Civil Service (Medical): No    Lack of Transportation (Non-Medical): No  Physical Activity: Insufficiently Active (01/08/2024)   Exercise Vital Sign    Days of Exercise per Week: 2 days    Minutes of Exercise per Session: 60 min  Stress: No Stress Concern Present (01/08/2024)   [employer] of Occupational Health - Occupational Stress Questionnaire    Feeling of Stress : Not at all  Social Connections: Moderately Integrated (03/02/2024)   Social Connection and Isolation Panel    Frequency of Communication with Friends and  Family: More than three times a week    Frequency of Social Gatherings with Friends and Family: Once a week    Attends Religious Services: More than 4 times per year    Active Member of [employer] or Organizations: Yes    Attends Banker Meetings: More than 4 times per  year    Marital Status: Divorced   Outpatient Encounter Medications as of 06/08/2024  Medication Sig   acetaminophen  (TYLENOL ) 500 MG tablet Take 2 tablets (1,000 mg total) by mouth every 8 (eight) hours as needed for mild pain or fever.   amLODipine  (NORVASC ) 10 MG tablet Take 1 tablet (10 mg total) by mouth daily.   Ascorbic Acid  (VITAMIN C  PO) Take 1 tablet by mouth daily.   aspirin  EC 81 MG tablet Take 1 tablet (81 mg total) by mouth daily. Swallow whole.   atorvastatin  (LIPITOR ) 80 MG tablet Take 1 tablet (80 mg total) by mouth daily.   Cholecalciferol  (VITAMIN D -3 PO) Take 1 capsule by mouth daily.   citalopram  (CELEXA ) 20 MG tablet Take 1 tablet (20 mg total) by mouth daily.   clopidogrel  (PLAVIX ) 75 MG tablet Take 1 tablet (75 mg total) by mouth daily.   dapagliflozin  propanediol (FARXIGA ) 10 MG TABS tablet Take 1 tablet (10 mg total) by mouth daily.   Dulaglutide  (TRULICITY ) 1.5 MG/0.5ML SOAJ Inject 1.5 mg into the skin once a week.   ezetimibe  (ZETIA ) 10 MG tablet TAKE ONE TABLET BY MOUTH DAILY   Ferrous Sulfate  (IRON PO) Take 1 tablet by mouth daily.   fluconazole  (DIFLUCAN ) 150 MG tablet Take 1 tablet (150 mg total) by mouth daily. (Patient not taking: Reported on 06/02/2024)   furosemide  (LASIX ) 40 MG tablet Take 1 tablet (40 mg total) by mouth daily as needed (excessive swelling).   MAGNESIUM  PO Take 1 tablet by mouth 2 (two) times daily.   naproxen  (NAPROSYN ) 375 MG tablet Take 1 tablet (375 mg total) by mouth 2 (two) times daily as needed for moderate pain (pain score 4-6). (Patient not taking: Reported on 06/02/2024)   nitroGLYCERIN  (NITROSTAT ) 0.4 MG SL tablet Place 1 tablet (0.4 mg total) under the  tongue every 5 (five) minutes x 3 doses as needed (if no relief after 3rd dose, proceed to ED or call 911).   ondansetron  (ZOFRAN -ODT) 4 MG disintegrating tablet Take 1 tablet (4 mg total) by mouth every 8 (eight) hours as needed for nausea or vomiting.   oxyCODONE -acetaminophen  (PERCOCET/ROXICET) 5-325 MG tablet Take 1 tablet by mouth every 4 (four) hours as needed for severe pain (pain score 7-10). (Patient not taking: Reported on 06/02/2024)   pantoprazole  (PROTONIX ) 40 MG tablet TAKE (1) TABLET BY MOUTH TWICE DAILY BEFORE A MEAL.   pregabalin (LYRICA) 75 MG capsule Take 75 mg by mouth daily.   RESTASIS  0.05 % ophthalmic emulsion Place 1 drop into both eyes 2 (two) times daily.   SYMBICORT  160-4.5 MCG/ACT inhaler INHALE 2 PUFFS INTO THE LUNGS FIRST THING IN THE MORNING AND 2 PUFFS ABOUT 12 HOURS LATER.   traMADol  (ULTRAM ) 50 MG tablet Take 1-2 tablets (50-100 mg total) by mouth every 8 (eight) hours as needed.   [DISCONTINUED] metFORMIN  (GLUCOPHAGE ) 500 MG tablet Take 1 tablet (500 mg total) by mouth 2 (two) times daily with a meal.   No facility-administered encounter medications on file as of 06/08/2024.    ALLERGIES: Allergies  Allergen Reactions   Coreg  [Carvedilol ] Other (See Comments)    Sinus pause on telemetry >3 seconds. Longest one 9 sec. No AV nodal agent   Zestril  [Lisinopril ] Anaphylaxis, Shortness Of Breath and Swelling    Angioedema, required intubation and mechanical ventilation   Chantix  [Varenicline ] Other (See Comments)    Nightmares Insomnia    Amoxil  [Amoxicillin ] Nausea Only    VACCINATION STATUS: Immunization History  Administered Date(s) Administered  Influenza,inj,Quad PF,6+ Mos 07/03/2016, 05/07/2017, 06/20/2020, 07/24/2021, 06/13/2022   Influenza-Unspecified 05/17/2018   Moderna SARS-COV2 Booster Vaccination 03/01/2020   Moderna Sars-Covid-2 Vaccination 02/20/2020   Pneumococcal Conjugate-13 06/17/2017   Pneumococcal Polysaccharide-23 02/07/2015   Tdap  05/17/2019    Diabetes He presents for his follow-up diabetic visit. He has type 2 diabetes mellitus. Onset time: He was diagnosed at approximate age of 49 years. His disease course has been improving. There are no hypoglycemic associated symptoms. Pertinent negatives for hypoglycemia include no confusion, headaches, pallor or seizures. Pertinent negatives for diabetes include no chest pain, no fatigue, no polydipsia, no polyphagia, no polyuria and no weakness. There are no hypoglycemic complications. Symptoms are improving. Diabetic complications include heart disease and peripheral neuropathy. Risk factors for coronary artery disease include dyslipidemia, diabetes mellitus, hypertension, male sex, family history, obesity, sedentary lifestyle and tobacco exposure. He is compliant with treatment most of the time. His weight is decreasing steadily. He is following a generally unhealthy diet. When asked about meal planning, he reported none. He has had a previous visit with a dietitian. He never participates in exercise. His home blood glucose trend is decreasing steadily. His overall blood glucose range is 140-180 mg/dl. (He presents with gout any meter nor logs.  His previsit A1c was found to be 5.1%, progressively improving.  Recently, he fell and injured left side of his body and difficulty moving his left upper arm.  He is currently working with a physical therapist, slowly recovering.   He continues to tolerate his current medications including Trulicity , metformin  and Farxiga .  He did not document hypoglycemia.  ) An ACE inhibitor/angiotensin II receptor blocker is contraindicated (He has documented allergy  for ACE inhibitor's). He does not see a podiatrist.Eye exam is not current.  Hyperlipidemia This is a chronic problem. The current episode started more than 1 year ago. Exacerbating diseases include diabetes and obesity. Pertinent negatives include no chest pain, myalgias or shortness of breath.  Current antihyperlipidemic treatment includes statins. Risk factors for coronary artery disease include dyslipidemia, diabetes mellitus, family history, obesity, male sex, hypertension and a sedentary lifestyle.  Hypertension This is a chronic problem. The current episode started more than 1 year ago. Pertinent negatives include no chest pain, headaches, neck pain, palpitations or shortness of breath. Risk factors for coronary artery disease include diabetes mellitus, dyslipidemia, obesity, sedentary lifestyle and smoking/tobacco exposure. Past treatments include calcium  channel blockers. Hypertensive end-organ damage includes CAD/MI.    Review of systems    Objective:    BP 122/72   Pulse 84   Ht 5' 5 (1.651 m)   Wt 174 lb 6.4 oz (79.1 kg)   BMI 29.02 kg/m   Wt Readings from Last 3 Encounters:  06/08/24 174 lb 6.4 oz (79.1 kg)  06/02/24 171 lb 6.4 oz (77.7 kg)  05/03/24 172 lb 4.8 oz (78.2 kg)      CMP     Component Value Date/Time   NA 142 05/31/2024 0823   K 4.2 05/31/2024 0823   CL 102 05/31/2024 0823   CO2 24 05/31/2024 0823   GLUCOSE 148 (H) 05/31/2024 0823   GLUCOSE 152 (H) 04/21/2024 1530   BUN 6 05/31/2024 0823   CREATININE 0.58 (L) 05/31/2024 0823   CREATININE 0.74 04/21/2024 1530   CALCIUM  9.6 05/31/2024 0823   PROT 7.2 05/31/2024 0823   ALBUMIN  4.2 05/31/2024 0823   AST 44 (H) 05/31/2024 0823   ALT 60 (H) 05/31/2024 0823   ALKPHOS 232 (H) 05/31/2024 9176  BILITOT 0.5 05/31/2024 0823   GFRNONAA >60 03/28/2024 0329   GFRNONAA 108 06/21/2020 0707   GFRAA 127 10/24/2020 0811   GFRAA 125 06/21/2020 0707    Diabetic Labs (most recent): Lab Results  Component Value Date   HGBA1C 5.1 03/24/2024   HGBA1C 6.7 02/02/2024   HGBA1C 9.7 (H) 09/12/2023   MICROALBUR 1.8 11/09/2019   MICROALBUR 0.7 11/17/2017   MICROALBUR 0.9 04/15/2017     Lipid Panel ( most recent) Lipid Panel     Component Value Date/Time   CHOL 110 05/31/2024 0823   TRIG 105  05/31/2024 0823   HDL 29 (L) 05/31/2024 0823   CHOLHDL 3.8 05/31/2024 0823   CHOLHDL 4.7 03/25/2024 0309   VLDL 20 03/25/2024 0309   LDLCALC 61 05/31/2024 0823   LDLCALC 116 (H) 06/21/2020 0707      Lab Results  Component Value Date   TSH 1.420 05/31/2024   TSH 3.61 06/21/2020   TSH 1.58 11/03/2018   TSH 1.79 11/17/2017   TSH 1.635 04/26/2017   TSH 2.564 06/27/2015   FREET4 1.55 05/31/2024   FREET4 1.5 11/03/2018   FREET4 1.4 11/17/2017      Assessment & Plan:   1. DM type 2 causing vascular disease (HCC)  - Randy Hebert has currently uncontrolled symptomatic type 2 DM since  58 years of age.  He presents with gout any meter nor logs.  His previsit A1c was found to be 5.1%, progressively improving.  Recently, he fell and injured left side of his body and difficulty moving his left upper arm.  He is currently working with a physical therapist, slowly recovering.   He continues to tolerate his current medications including Trulicity , metformin  and Farxiga .  He did not document hypoglycemia.    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  He also has recent diagnosis of CHF, coronary artery disease which required stent placement,  and Randy Hebert remains at extremely high risk for more acute and chronic complications which include CAD, CVA, CKD, retinopathy, and neuropathy. These are all discussed in detail with the patient.  - I have counseled him on diet management and weight loss, by adopting a carbohydrate restricted/protein rich diet. -Still admits to dietary indiscretions including consumption of sweets and sweetened beverages.    he acknowledges that there is a room for improvement in his food and drink choices. - Suggestion is made for him to avoid simple carbohydrates  from his diet including Cakes, Sweet Desserts, Ice Cream, Soda (diet and  regular), Sweet Tea, Candies, Chips, Cookies, Store Bought Juices, Alcohol  in Excess of  1-2 drinks a day, Artificial Sweeteners,  Coffee Creamer, and Sugar-free Products, Lemonade. This will help patient to have more stable blood glucose profile and potentially avoid unintended weight gain.  - I encouraged him to switch to  unprocessed or minimally processed complex starch and increased protein intake (animal or plant source), fruits, and vegetables.  - he is advised to stick to a routine mealtimes to eat 3 meals  a day and avoid unnecessary snacks ( to snack only to correct hypoglycemia).    - I have approached him with the following individualized plan to manage diabetes and patient agrees:   - Dmarco presents with continued improvement in his glycemic profile, did not bring any logs nor meter however previsit labs showed show A1c of 5.1%.   - He will not need  insulin  treatment.  He is advised to continue Trulicity  1.5 mg subcutaneously weekly, Farxiga  10 mg daily at breakfast.  Side effects and precautions discussed with him.    - At this time, he is advised to hold metformin  for now.  When he is able, he is urged to start monitoring blood glucose twice a day-daily before breakfast and at bedtime. -He is encouraged to call clinic for hypoglycemia under 70 or hyperglycemia above 200 mg per DL.   The patient was counseled on the dangers of tobacco use, and was advised to quit.  Reviewed strategies to maximize success, including removing cigarettes and smoking materials from environment.   During his last encounter, whole food plant-based diet was discussed with him to address most of his chronic diseases including type 2 diabetes, fatty liver disease, hyperlipidemia, hypertension, obesity.   -Patient specific target  A1c;  LDL, HDL, Triglycerides, and  Waist Circumference were discussed in detail.  2) BP/HTN:  His blood pressure is controlled.  -He remains a chronic heavy smoker. He  has a documented allergy  of ACE inhibitors anaphylaxis .  He is advised to continue amlodipine  10 mg p.o. daily, with as needed diuretics.  He is extensively counseled for smoking cessation.     3) Lipids/HPL: His recent weight loss and plant-based diet is a partial engagement, his drop in his LDL to 61 from 116. He reports that he is more consistent taking his statin at this time.  He is advised to continue atorvastatin  80 mg p.o. nightly also on Zetia  10 mg p.o. daily.    His history of  fatty liver disease, and cirrhosis of liver likely induced by alcohol , complicates his options of treatment to manage dyslipidemia.     4)  Weight/Diet: His BMI is 29.02 kg/m -abdominal obesity clearly complicating his diabetes care.  He is a candidate for modest weight loss.  CDE Consult has been initiated and in progress,  exercise, and detailed carbohydrates information provided.  5) Chronic Care/Health Maintenance:  -he  is on Statin medications and  is encouraged to continue to follow up with Ophthalmology, Dentist,  Podiatrist at least yearly or according to recommendations, and advised to  Quit smoking ( is a chronic heavy smoker for the last 40 years), and consider weaning off of alcohol .  I have recommended yearly flu vaccine and pneumonia vaccination at least every 5 years; moderate intensity exercise for up to 150 minutes weekly; and  sleep for at least 7 hours a day.   - I advised patient to maintain close follow up with Cook, Jayce G, DO for primary care needs.   I spent  26  minutes in the care of the patient today including review of labs from CMP, Lipids, Thyroid  Function, Hematology (current and previous including abstractions from other facilities); face-to-face time discussing  his blood glucose readings/logs, discussing hypoglycemia and hyperglycemia episodes and symptoms, medications doses, his options of short and long term treatment based on the latest standards of care / guidelines;   discussion about incorporating lifestyle medicine;  and documenting the encounter. Risk reduction counseling performed per USPSTF guidelines to reduce cardiovascular risk factors.     Please refer to Patient Instructions for Blood Glucose Monitoring and Insulin /Medications Dosing Guide  in media tab for additional information. Please  also refer to  Patient Self Inventory in the Media  tab for reviewed elements of pertinent patient history.  Randy Hebert participated in the discussions, expressed understanding, and voiced agreement with the above  plans.  All questions were answered to his satisfaction. he is encouraged to contact clinic should he have any questions or concerns prior to his return visit.   Follow up plan: - Return in about 6 months (around 12/07/2024) for F/U with Pre-visit Labs, A1c -NV.  Ranny Earl, MD [facility] [address] [place], [place] [address] Phone: [phone]  Fax: [phone]    06/08/2024, 2:03 PM  This note was partially dictated with voice recognition software. Similar sounding words can be transcribed inadequately or may not  be corrected upon review.

## 2024-06-08 NOTE — Patient Instructions (Signed)

## 2024-06-09 ENCOUNTER — Ambulatory Visit: Admitting: "Endocrinology

## 2024-06-10 ENCOUNTER — Ambulatory Visit: Admitting: Family

## 2024-06-14 ENCOUNTER — Ambulatory Visit: Admitting: Physical Therapy

## 2024-06-19 ENCOUNTER — Ambulatory Visit: Attending: Cardiology

## 2024-06-19 DIAGNOSIS — I639 Cerebral infarction, unspecified: Secondary | ICD-10-CM

## 2024-06-20 ENCOUNTER — Other Ambulatory Visit: Payer: Self-pay | Admitting: *Deleted

## 2024-06-20 DIAGNOSIS — I639 Cerebral infarction, unspecified: Secondary | ICD-10-CM

## 2024-06-21 ENCOUNTER — Encounter: Payer: Self-pay | Admitting: Physical Therapy

## 2024-06-21 ENCOUNTER — Ambulatory Visit: Admitting: Physical Therapy

## 2024-06-21 DIAGNOSIS — M25512 Pain in left shoulder: Secondary | ICD-10-CM

## 2024-06-21 DIAGNOSIS — M6281 Muscle weakness (generalized): Secondary | ICD-10-CM

## 2024-06-21 NOTE — Therapy (Signed)
 OUTPATIENT PHYSICAL THERAPY TREATMENT Progress Note reporting period 03/31/24 to 06/21/24  See below for objective and subjective measurements relating to patients progress with PT.    Patient Name: Randy Hebert MRN: [id number] DOB:04/07/1966, 58 y.o., male Today's Date: 06/21/2024  END OF SESSION:  PT End of Session - 06/21/24 0935     Visit Number 10    Number of Visits 20    Date for Recertification  06/23/24    Progress Note Due on Visit 10    PT Start Time 0930    PT Stop Time 1010    PT Time Calculation (min) 40 min    Activity Tolerance Patient tolerated treatment well    Behavior During Therapy [facility] for tasks assessed/performed             Past Medical History:  Diagnosis Date   ACE inhibitor-aggravated angioedema    Allergy     Anemia    Angio-edema    Anxiety    Arthritis    Asthma    hip replacement   Back pain    Bradycardia 04/28/2017   Bulging of cervical intervertebral disc    CAD (coronary artery disease)    lateral STEMI 02/06/2015 00% D1 occlusion treated with Promus Premier 2.5 mm x 16 mm DES, 70% ramus stenosis, 40% mid RCA stenosis, 45% distal RCA stenosis, EF 45-50%   CHF (congestive heart failure) (HCC)    COPD (chronic obstructive pulmonary disease) (HCC)    Depression    Diabetes mellitus without complication (HCC)    Difficult intubation    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   Dry eye    Dyspnea    Early satiety 09/23/2016   Fatty liver    GERD (gastroesophageal reflux disease)    HCAP (healthcare-associated pneumonia) 05/15/2017   Headache    Heart murmur    Hip pain    Hyperlipidemia    Hypertension    Lobar pneumonia 05/15/2017   Melena 08/04/2018   MI (myocardial infarction) [facility])    Myocardial infarction (HCC)    Neck pain    Non-ST elevation (NSTEMI) myocardial infarction (HCC) 04/27/2017   NSTEMI (non-ST elevated myocardial infarction) (HCC) 04/26/2017   Otitis media    Pleurisy    Pneumonia due  to COVID-19 virus 10/07/2019   Rectal bleeding 11/08/2018   Right shoulder pain 03/20/2020   Sinus pause    9 sec sinus pause on telemetry after started on coreg  after MI, avoid AV nodal blocking agent   Sleep apnea    uses a cpap   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   STEMI (ST elevation myocardial infarction) (HCC) 05/31/2015   Substance abuse (HCC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   Syncope 06/29/2017   Syncope and collapse 05/15/2017   Transaminitis 08/04/2018   Unilateral primary osteoarthritis, right hip 07/01/2016   Past Surgical History:  Procedure Laterality Date   ARTERY EXPLORATION Left 03/24/2024   Procedure: LEFT ARCH AORTOGRAM, LEFT UPPER EXTREMITY ANGIOGRAM WITH FIRST ORDER CANNULATION, LEFT AXILLARY ANGIOPLASTY AND STENTING, EVACUATION OF LEFT AXILLARY HEMATOMA;  Surgeon: Magda Debby SAILOR, MD;  Location: [facility] OR;  Service: Vascular;  Laterality: Left;   ARTERY REPAIR Left 03/11/2024   Procedure: REPAIR LEFT AXILLARY ARTERY;  Surgeon: Serene Gaile ORN, MD;  Location: [facility] OR;  Service: Vascular;  Laterality: Left;  ARTERIAL REPAIR, LEFT AXILLARY   BIOPSY  10/09/2016   Procedure: BIOPSY;  Surgeon: Lamar CHRISTELLA Hollingshead, MD;  Location: [facility] ENDO SUITE;  Service: Endoscopy;;   BIOPSY  11/28/2019   Procedure: BIOPSY;  Surgeon: Shaaron Lamar HERO, MD;  [facility]: [facility];  Service: Endoscopy;;   BIOPSY  06/15/2023   Procedure: BIOPSY;  Surgeon: Shaaron Lamar HERO, MD;  [facility];  Service: Endoscopy;;   BRONCHIAL BIOPSY  10/16/2022   Procedure: BRONCHIAL BIOPSIES;  Surgeon: Gladis Leonor HERO, MD;  Location: [facility] ENDOSCOPY;  Service: Pulmonary;;   BRONCHIAL NEEDLE ASPIRATION BIOPSY  10/16/2022   Procedure: BRONCHIAL NEEDLE ASPIRATION BIOPSIES;  Surgeon: Gladis Leonor HERO, MD;  Location: [facility] ENDOSCOPY;  Service: Pulmonary;;   BRONCHIAL WASHINGS  10/16/2022   Procedure: BRONCHIAL WASHINGS;  Surgeon: Gladis Leonor HERO, MD;  Location: [facility] ENDOSCOPY;  Service: Pulmonary;;   CARDIAC  CATHETERIZATION N/A 02/06/2015   Procedure: Left Heart Cath and Coronary Angiography;  Surgeon: Alm LELON Clay, MD;  Location: [facility] INVASIVE CV LAB;  Service: Cardiovascular;  Laterality: N/A;   CARDIAC CATHETERIZATION N/A 02/06/2015   Procedure: Coronary Stent Intervention;  Surgeon: Alm LELON Clay, MD;  Location: [facility] INVASIVE CV LAB;  Service: Cardiovascular;  Laterality: N/A;   COLONOSCOPY WITH PROPOFOL  N/A 10/09/2016   Sigmoid and descending colon diverticulosis, four 4-6 mm polyps in sigmoid, one 4 mm polyp in descending. Tubular adenomas and hyperplastic. 5 year surveillance.    COLONOSCOPY WITH PROPOFOL  N/A 11/24/2016   Sigmoid and descending colon diverticulosis, four 4-6 mm polyps in sigmoid, one 4 mm polyp in descending. Tubular adenomas and hyperplastic. 5 year surveillance.    COLONOSCOPY WITH PROPOFOL  N/A 10/18/2021   Carver: nonbleeding internal hemorrhoids, small and large mouth diverticula found in the sigmoid, descending, transverse colon.  A 9 mm sessile polyp was found in the ascending colon that was removed.  4 sessile polyps found in the sigmoid, descending, transverse colon 3 to 5 mm in size, examining otherwise.  Path revealed tubular adenomas.  Repeat due in 5 years for surveillance.   CORONARY ANGIOPLASTY WITH STENT PLACEMENT  01/2015   CORONARY STENT INTERVENTION N/A 04/27/2017   Procedure: CORONARY STENT INTERVENTION;  Surgeon: Mady Bruckner, MD;  [facility]: [facility] INVASIVE CV LAB;  Service: Cardiovascular;  Laterality: N/A;   ELECTROPHYSIOLOGY STUDY N/A 06/29/2017   Procedure: ELECTROPHYSIOLOGY STUDY;  Surgeon: Waddell Danelle LELON, MD;  Location: [facility] INVASIVE CV LAB;  Service: Cardiovascular;  Laterality: N/A;   ESOPHAGOGASTRODUODENOSCOPY (EGD) WITH PROPOFOL  N/A 10/09/2016   Dr. Shaaron: LA grade a esophagitis.  Barrett's esophagus, biopsy-proven.  Small hiatal hernia.  EGD February 2019   ESOPHAGOGASTRODUODENOSCOPY (EGD) WITH PROPOFOL  N/A 11/28/2019    salmon-colored esophageal  mucosa (Barrett's) small hiatal hernia, portal hypertensive gastropathy, normal duodenum, 3 year surveillance   ESOPHAGOGASTRODUODENOSCOPY (EGD) WITH PROPOFOL  N/A 09/06/2021   three columns of grade 1 varices in distal esophagus, no stigmata of bleeding or red wale signs. Small hiatal hernia. Mild portal gastropathy. Normal duodenum. Repeat upper endoscopy in 1 year for surveillance.   ESOPHAGOGASTRODUODENOSCOPY (EGD) WITH PROPOFOL  N/A 06/15/2023   Procedure: ESOPHAGOGASTRODUODENOSCOPY (EGD) WITH PROPOFOL ;  Surgeon: Shaaron Lamar HERO, MD;  [facility];  Service: Endoscopy;  Laterality: N/A;  830am, asa 3   FIDUCIAL MARKER PLACEMENT  10/16/2022   Procedure: FIDUCIAL MARKER PLACEMENT;  Surgeon: Gladis Leonor HERO, MD;  Location: [facility] ENDOSCOPY;  Service: Pulmonary;;   INCISION / DRAINAGE HAND / FINGER     LEFT HEART CATH AND CORONARY ANGIOGRAPHY N/A 04/27/2017   Procedure: LEFT HEART CATH AND CORONARY ANGIOGRAPHY;  Surgeon: Mady Bruckner, MD;  [facility] INVASIVE CV LAB;  Service: Cardiovascular;  Laterality: N/A;   LOOP RECORDER INSERTION  06/29/2017   Procedure: Loop Recorder Insertion;  Surgeon: Waddell Danelle ORN, MD;  Location: [facility] INVASIVE CV LAB;  Service: Cardiovascular;;   POLYPECTOMY  11/24/2016   Procedure: POLYPECTOMY;  Surgeon: Lamar CHRISTELLA Hollingshead, MD;  [facility];  Service: Endoscopy;;  descending and sigmoid   POLYPECTOMY  10/18/2021   Procedure: POLYPECTOMY;  Surgeon: Cindie Carlin POUR, DO;  [facility];  Service: Endoscopy;;   TOTAL HIP ARTHROPLASTY Right 07/01/2016   TOTAL HIP ARTHROPLASTY Right 07/01/2016   Procedure: RIGHT TOTAL HIP ARTHROPLASTY ANTERIOR APPROACH;  Surgeon: Lonni CINDERELLA Poli, MD;  Location: [facility] OR;  Service: Orthopedics;  Laterality: Right;   ULTRASOUND GUIDANCE FOR VASCULAR ACCESS Left 03/24/2024   Procedure: ULTRASOUND GUIDANCE, FOR VASCULAR ACCESS;  Surgeon: Magda Debby SAILOR, MD;  Location: [facility] OR;  Service: Vascular;   Laterality: Left;   Patient Active Problem List   Diagnosis Date Noted   Pain of left upper extremity 04/24/2024   Acute postoperative anemia due to greater than expected blood loss 03/26/2024   Obesity, class 1 03/26/2024   Aneurysm of axillary artery 03/24/2024   Axillary artery injury 03/12/2024   Acute CVA (cerebrovascular accident) (HCC) 03/02/2024   Essential hypertension 03/02/2024   Dyslipidemia 03/02/2024   Type 2 diabetes mellitus with hyperlipidemia (HCC) 03/02/2024   Erectile dysfunction 02/24/2023   Chronic eczematous otitis externa of both ears 02/05/2023   Tobacco abuse 12/25/2022   Malignant neoplasm of upper lobe of right lung (HCC) 09/25/2022   Cervical spondylosis with myelopathy 08/01/2022   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   DDD (degenerative disc disease), cervical 05/21/2022   Osteoarthritis of both hands 05/19/2022   History of GI bleed 11/13/2021   Insomnia 09/13/2021   Cirrhosis of liver (HCC) 11/08/2018   Barrett's esophagus 08/04/2018   Vitamin D  deficiency 06/30/2018   Alcohol  abuse 06/30/2018   DM type 2 causing vascular disease (HCC) 08/17/2017   Chronic combined systolic and diastolic CHF (congestive heart failure) (HCC) 05/15/2017   OSA (obstructive sleep apnea) 04/28/2017   Depression with anxiety 04/15/2017   GERD (gastroesophageal reflux disease) 09/23/2016   Asthmatic bronchitis , chronic (HCC) 08/27/2015   Mixed hyperlipidemia 08/27/2015   Class 1 obesity due to excess calories with serious comorbidity and body mass index (BMI) of 32.0 to 32.9 in adult 08/27/2015   CAD (coronary artery disease) 05/31/2015   Essential hypertension, benign 02/09/2015    PCP: Cook, Jayce G, DO   REFERRING PROVIDER: Noralee Elidia Sieving*   REFERRING DIAG: left axillary pseudoaneurysm   Rationale for Evaluation and Treatment:  Rehabiliation  THERAPY DIAG:  Acute pain of left shoulder  Muscle weakness (generalized)  ONSET DATE:  03/11/24   SUBJECTIVE:  SUBJECTIVE STATEMENT: Pt feels his left arm has improved to 30% function.   PERTINENT HISTORY:  past medical history of diabetes mellitus type 2, hypertension, CAD, liver cirrhosis, anxiety, depression, OSA, CVA w/ left-sided weakness who had a recent hospitalization 7/11 to 03/19/24 post traumatic left shoulder dislocation, complicated with left axillary hematoma with left axillary pseudoaneurysm. On 07/12 he underwent open exposure and repair of the left axillary pseudoaneurysm and evacuation of left axillary hematoma.   PAIN:  NPRS scale: 5/10 Pain location:left shoulder/axillary, left forearm and wrist Pain description: constant, achy, sharp Aggravating factors: any use of this left arm. Relieving factors: rest, meds   PRECAUTIONS: ,  Shoulder  RED FLAGS: None   WEIGHT BEARING RESTRICTIONS:  Not anymore  FALLS:  Has patient fallen in last 6 months? Yes. Number of falls 1   OCCUPATION:  disabled  PLOF:  Independent with basic ADLs  PATIENT GOALS:  Reduce pain and get his arm back to normal  OBJECTIVE:  Note: Objective measures were completed at Evaluation unless otherwise noted.  DIAGNOSTIC FINDINGS:  Chest CT IMPRESSION: 1. Enlarging left axillary hematoma with arterial contrast extravasation from the posterior wall of the left axillary artery into a 1.5 x 1.8 cm pseudoaneurysm, with additional 1.8 x 1.1 cm pocket of contrast more posteriorly within the collection. 2. Increased heterogeneous hematoma in the left subscapularis region measuring 7.3 x 4.8 cm, but no focal contrast is seen within this. 3. Emphysema. 4. Right apical fiducial markers and post XRT changes, with treated nodule just above the fiducial markers today measuring 8 mm, previously 9  mm. 5. Aortic and coronary artery atherosclerosis.  Right aortic arch. 6. Enlarged pulmonary trunk 3.3 cm indicating arterial hypertension. 7. Cirrhosis and splenomegaly.  Prominent patent portal vein 1.7 cm. 8. Critical Value/emergent results were called by telephone at the time of interpretation on 03/24/2024 at 5:18 am to provider [facility] , who verbally acknowledged these results.  Shoulder XR IMPRESSION: No acute bony abnormality.   Postoperative changes in the left axilla.  No imaging for left elbow/wrist  PATIENT SURVEYS:  Patient-Specific Activity Scoring Scheme  0 represents "unable to perform." 10 represents "able to perform at prior level. 0 1 2 3 4 5 6 7 8 9  10 (Date and Score)   Activity Eval  06/21/24  1. Raising left arm  0 0   2. Putting on and off shirt 1  8  3.     4.    5.    Score 0.5/10 4/10   Total score = sum of the activity scores/number of activities Minimum detectable change (90%CI) for average score = 2 points Minimum detectable change (90%CI) for single activity score = 3 points     EDEMA:  Yes: moderate edema noted in left upper extremity from shoulder to hand  Observation:  2 incisions noted, one at proximal chest, one at axilla, both incisions closed with stitches and appear closed without any drainage or signs of infection at the moment. Dressing was removed and changed for him at eval.     UPPER EXTREMITY ROM:  AROM/Passive ROM Left eval Left 04/13/24 Left 05/24/24 Left 06/21/24  Shoulder flexion /50 /120 30/150 40/  Shoulder extension      Shoulder abduction /50 /100 20/140 22/  Shoulder adduction      Shoulder extension      Shoulder internal rotation /50     Shoulder external rotation /40 /70    Elbow flexion  Elbow extension      Wrist flexion      Wrist extension      Wrist ulnar deviation      Wrist radial deviation      Wrist pronation      Wrist supination       (Blank rows = not tested)   UPPER  EXTREMITY MMT:  MMT Left eval Left 05/24/24 Left 06/21/24  Shoulder flexion 1 2 2   Shoulder extension     Shoulder abduction 1 2 2   Shoulder adduction     Shoulder extension     Shoulder internal rotation 1 2 4   Shoulder external rotation 1 2 3   Middle trapezius     Lower trapezius     Elbow flexion 1 3 4   Elbow extension 2 3 4   Wrist flexion     Wrist extension     Wrist ulnar deviation     Wrist radial deviation     Wrist pronation     Wrist supination     Grip strength      (Blank rows = not tested)                                                                                                                              TREATMENT DATE:  06/21/24 Theractivity UBE L1 X 6 min with left hand wrapped to handle with ace wrap to maintain grip Standing p ball roll outs on mat table for AAROM flexion, abduction, circles CW & CCW X 15 reps each for reaching Gripping towel X 20 Blaze pods 4 pods, using UE to turn off working on reaching horizontal abduction/adduction and hand control, pods placed on high mat, performed 30 sec X 3 with 30 sec rest in between Blaze pods 3 pods, using UE to turn off working on reaching flexion/extension and hand control, pods placed on high mat, performed 30 sec X 3 with 30 sec rest in between  Therex: Supine shoulder flexion eccentrics with PT assist x15 Supine chest press AROM X 15 Supine shoulder ER against manual PT resistance X 10 Supine shoulder IR against manual PT resistance X 10 Supine shoulder abduction AROM X 15 reps    PATIENT EDUCATION: Education details: HEP, PT plan of care, selfcare Person educated: Patient Education method: Explanation, Demonstration, Verbal cues, and Handouts Education comprehension: verbalized understanding, further education recommended   HOME EXERCISE PROGRAM: Access Code: [email or web address] URL: [email or web address] Date: 03/31/2024 Prepared by: Redell Moose  Exercises - Circular Shoulder  Pendulum with Table Support  - 2-3 x daily - 7 x weekly - 1 sets - 15 reps - Seated Shoulder Abduction Towel Slide at Table Top  - 2-3 x daily - 6 x weekly - 1 sets - 10 reps - 5 sec hold - Seated Shoulder Flexion Towel Slide at Table Top  - 2-3 x daily - 6 x weekly - 1 sets - 10 reps - 5 sec hold - Seated Shoulder  External Rotation PROM on Table  - 2-3 x daily - 6 x weekly - 1 sets - 10 reps - 5 sec hold - Seated Gripping Towel  - 2-3 x daily - 6 x weekly - 1 sets - 20 reps - Standing Elbow Flexion Extension AROM  - 2-3 x daily - 6 x weekly - 1 sets - 10 reps  ASSESSMENT:  CLINICAL IMPRESSION: 10th visit progress note does show he is making some progress in terms of improved strength and ROM of his left UE however this remains very limited due to deltoid weakness and 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 He had NVC test but does not know any results yet. He has met short term PT goals but not yet met any long term goals. Skilled PT remains necessary to improve functional use of left UE.   OBJECTIVE IMPAIRMENTS: decreased activity tolerance for ADL's, difficulty walking, decreased balance, decreased endurance, decreased mobility, decreased ROM, decreased strength, impaired flexibility, impaired UE use, and pain.  ACTIVITY LIMITATIONS: bending, lifting, carry, reaching, locomotion, cleaning, community activity, driving,  PERSONAL FACTORS: see above PMH  also affecting patient's functional outcome.  REHAB POTENTIAL: Good  CLINICAL DECISION MAKING: Evolving/moderate complexity  EVALUATION COMPLEXITY: Moderate    GOALS: Short term PT Goals Target date: 04/28/2024   Pt will be I and compliant with HEP. Baseline:  Goal status: MET 04/18/24 Pt will decrease pain by 25% overall Baseline: Goal status: MET 04/18/24  Long term PT goals Target date:06/23/2024  Pt will improve right shoulder and elbow AROM to [facility] to improve functional mobility Baseline: Goal status: ongoing 10/21 Pt will  improve  right arm strength to at least 4+/5 MMT to improve functional strength Baseline: Goal status: ongoing 10/21 Pt will improve Patient specific functional scale (PSFS) to at least 7/10 to show improved function level Baseline: Goal status: ongoing 10/21 Pt will reduce pain to overall less than 3/10 with usual activity Baseline: Goal status: ongoing 10/21 PLAN: PT FREQUENCY: 1-3 times per week   PT DURATION: 6-8 weeks  PLANNED INTERVENTIONS (unless contraindicated):  97110-Therapeutic exercises, 97530- Therapeutic activity, 97112- Neuromuscular re-education, 97535- Self Care, 02859- Manual therapy, [id number]- Electrical stimulation (unattended), 97016- Vasopneumatic device, [id number]- Ultrasound, and 97033- Ionotophoresis 4mg /ml Dexamethasone   PLAN FOR NEXT SESSION: progress strength as tolerated, look out for NCV results.    Redell JONELLE Moose, PT,DPT 06/21/2024, 9:44 AM

## 2024-06-27 ENCOUNTER — Encounter: Payer: Self-pay | Admitting: Physical Therapy

## 2024-06-27 ENCOUNTER — Ambulatory Visit: Admitting: Physical Therapy

## 2024-06-27 ENCOUNTER — Ambulatory Visit: Admitting: Gastroenterology

## 2024-06-27 DIAGNOSIS — M25512 Pain in left shoulder: Secondary | ICD-10-CM

## 2024-06-27 DIAGNOSIS — M6281 Muscle weakness (generalized): Secondary | ICD-10-CM

## 2024-06-27 NOTE — Therapy (Signed)
 OUTPATIENT PHYSICAL THERAPY TREATMENT   Patient Name: Randy Hebert MRN: [id number] DOB:17-Jan-1966, 58 y.o., male Today's Date: 06/27/2024  END OF SESSION:  PT End of Session - 06/27/24 0802     Visit Number 11    Number of Visits 20    Date for Recertification  06/23/24    Progress Note Due on Visit 10    PT Start Time 0800    PT Stop Time 0840    PT Time Calculation (min) 40 min    Activity Tolerance Patient tolerated treatment well    Behavior During Therapy [facility] for tasks assessed/performed             Past Medical History:  Diagnosis Date   ACE inhibitor-aggravated angioedema    Allergy     Anemia    Angio-edema    Anxiety    Arthritis    Asthma    hip replacement   Back pain    Bradycardia 04/28/2017   Bulging of cervical intervertebral disc    CAD (coronary artery disease)    lateral STEMI 02/06/2015 00% D1 occlusion treated with Promus Premier 2.5 mm x 16 mm DES, 70% ramus stenosis, 40% mid RCA stenosis, 45% distal RCA stenosis, EF 45-50%   CHF (congestive heart failure) (HCC)    COPD (chronic obstructive pulmonary disease) (HCC)    Depression    Diabetes mellitus without complication (HCC)    Difficult intubation    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   Dry eye    Dyspnea    Early satiety 09/23/2016   Fatty liver    GERD (gastroesophageal reflux disease)    HCAP (healthcare-associated pneumonia) 05/15/2017   Headache    Heart murmur    Hip pain    Hyperlipidemia    Hypertension    Lobar pneumonia 05/15/2017   Melena 08/04/2018   MI (myocardial infarction) [facility])    Myocardial infarction (HCC)    Neck pain    Non-ST elevation (NSTEMI) myocardial infarction (HCC) 04/27/2017   NSTEMI (non-ST elevated myocardial infarction) (HCC) 04/26/2017   Otitis media    Pleurisy    Pneumonia due to COVID-19 virus 10/07/2019   Rectal bleeding 11/08/2018   Right shoulder pain 03/20/2020   Sinus pause    9 sec sinus pause on telemetry after  started on coreg  after MI, avoid AV nodal blocking agent   Sleep apnea    uses a cpap   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   STEMI (ST elevation myocardial infarction) (HCC) 05/31/2015   Substance abuse (HCC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   Syncope 06/29/2017   Syncope and collapse 05/15/2017   Transaminitis 08/04/2018   Unilateral primary osteoarthritis, right hip 07/01/2016   Past Surgical History:  Procedure Laterality Date   ARTERY EXPLORATION Left 03/24/2024   Procedure: LEFT ARCH AORTOGRAM, LEFT UPPER EXTREMITY ANGIOGRAM WITH FIRST ORDER CANNULATION, LEFT AXILLARY ANGIOPLASTY AND STENTING, EVACUATION OF LEFT AXILLARY HEMATOMA;  Surgeon: Magda Debby SAILOR, MD;  Location: [facility] OR;  Service: Vascular;  Laterality: Left;   ARTERY REPAIR Left 03/11/2024   Procedure: REPAIR LEFT AXILLARY ARTERY;  Surgeon: Serene Gaile ORN, MD;  Location: [facility] OR;  Service: Vascular;  Laterality: Left;  ARTERIAL REPAIR, LEFT AXILLARY   BIOPSY  10/09/2016   Procedure: BIOPSY;  Surgeon: Lamar CHRISTELLA Hollingshead, MD;  Location: [facility] END[facility] SU[facility];  Service: Endoscopy;;   BIOPSY  11/28/2019   Procedure: BIOPSY;  Surgeon: Hollingshead Lamar CHRISTELLA, MD;  Location: [facility];  Service: Endoscopy;;   BIOPSY  06/15/2023   Procedure: BIOPSY;  Surgeon: Shaaron Lamar HERO, MD;  [facility]: [facility];  Service: Endoscopy;;   BRONCHIAL BIOPSY  10/16/2022   Procedure: BRONCHIAL BIOPSIES;  Surgeon: Gladis Leonor HERO, MD;  Location: [facility] ENDOSCOPY;  Service: Pulmonary;;   BRONCHIAL NEEDLE ASPIRATION BIOPSY  10/16/2022   Procedure: BRONCHIAL NEEDLE ASPIRATION BIOPSIES;  Surgeon: Gladis Leonor HERO, MD;  Location: [facility] ENDOSCOPY;  Service: Pulmonary;;   BRONCHIAL WASHINGS  10/16/2022   Procedure: BRONCHIAL WASHINGS;  Surgeon: Gladis Leonor HERO, MD;  Location: [facility] ENDOSCOPY;  Service: Pulmonary;;   CARDIAC CATHETERIZATION N/A 02/06/2015   Procedure: Left Heart Cath and Coronary Angiography;  Surgeon: Alm LELON Clay, MD;  Location: [facility] INVASIVE CV LAB;   Service: Cardiovascular;  Laterality: N/A;   CARDIAC CATHETERIZATION N/A 02/06/2015   Procedure: Coronary Stent Intervention;  Surgeon: Alm LELON Clay, MD;  Location: [facility] INVASIVE CV LAB;  Service: Cardiovascular;  Laterality: N/A;   COLONOSCOPY WITH PROPOFOL  N/A 10/09/2016   Sigmoid and descending colon diverticulosis, four 4-6 mm polyps in sigmoid, one 4 mm polyp in descending. Tubular adenomas and hyperplastic. 5 year surveillance.    COLONOSCOPY WITH PROPOFOL  N/A 11/24/2016   Sigmoid and descending colon diverticulosis, four 4-6 mm polyps in sigmoid, one 4 mm polyp in descending. Tubular adenomas and hyperplastic. 5 year surveillance.    COLONOSCOPY WITH PROPOFOL  N/A 10/18/2021   Carver: nonbleeding internal hemorrhoids, small and large mouth diverticula found in the sigmoid, descending, transverse colon.  A 9 mm sessile polyp was found in the ascending colon that was removed.  4 sessile polyps found in the sigmoid, descending, transverse colon 3 to 5 mm in size, examining otherwise.  Path revealed tubular adenomas.  Repeat due in 5 years for surveillance.   CORONARY ANGIOPLASTY WITH STENT PLACEMENT  01/2015   CORONARY STENT INTERVENTION N/A 04/27/2017   Procedure: CORONARY STENT INTERVENTION;  Surgeon: Mady Bruckner, MD;  Location: [facility] INVASIVE CV LAB;  Service: Cardiovascular;  Laterality: N/A;   ELECTROPHYSIOLOGY STUDY N/A 06/29/2017   Procedure: ELECTROPHYSIOLOGY STUDY;  Surgeon: Waddell Danelle LELON, MD;  Location: [facility] INVASIVE CV LAB;  Service: Cardiovascular;  Laterality: N/A;   ESOPHAGOGASTRODUODENOSCOPY (EGD) WITH PROPOFOL  N/A 10/09/2016   Dr. Shaaron: LA grade a esophagitis.  Barrett's esophagus, biopsy-proven.  Small hiatal hernia.  EGD February 2019   ESOPHAGOGASTRODUODENOSCOPY (EGD) WITH PROPOFOL  N/A 11/28/2019    salmon-colored esophageal mucosa (Barrett's) small hiatal hernia, portal hypertensive gastropathy, normal duodenum, 3 year surveillance   ESOPHAGOGASTRODUODENOSCOPY (EGD) WITH  PROPOFOL  N/A 09/06/2021   three columns of grade 1 varices in distal esophagus, no stigmata of bleeding or red wale signs. Small hiatal hernia. Mild portal gastropathy. Normal duodenum. Repeat upper endoscopy in 1 year for surveillance.   ESOPHAGOGASTRODUODENOSCOPY (EGD) WITH PROPOFOL  N/A 06/15/2023   Procedure: ESOPHAGOGASTRODUODENOSCOPY (EGD) WITH PROPOFOL ;  Surgeon: Shaaron Lamar HERO, MD;  [facility];  Service: Endoscopy;  Laterality: N/A;  830am, asa 3   FIDUCIAL MARKER PLACEMENT  10/16/2022   Procedure: FIDUCIAL MARKER PLACEMENT;  Surgeon: Gladis Leonor HERO, MD;  Location: [facility] ENDOSCOPY;  Service: Pulmonary;;   INCISION / DRAINAGE HAND / FINGER     LEFT HEART CATH AND CORONARY ANGIOGRAPHY N/A 04/27/2017   Procedure: LEFT HEART CATH AND CORONARY ANGIOGRAPHY;  Surgeon: Mady Bruckner, MD;  Location: [facility] INVASIVE CV LAB;  Service: Cardiovascular;  Laterality: N/A;   LOOP RECORDER INSERTION  06/29/2017   Procedure: Loop Recorder Insertion;  Surgeon: Waddell Danelle LELON, MD;  Location:  [facility] INVASIVE CV LAB;  Service: Cardiovascular;;   POLYPECTOMY  11/24/2016   Procedure: POLYPECTOMY;  Surgeon: Lamar CHRISTELLA Hollingshead, MD;  [facility];  Service: Endoscopy;;  descending and sigmoid   POLYPECTOMY  10/18/2021   Procedure: POLYPECTOMY;  Surgeon: Cindie Carlin POUR, DO;  [facility]: [facility];  Service: Endoscopy;;   TOTAL HIP ARTHROPLASTY Right 07/01/2016   TOTAL HIP ARTHROPLASTY Right 07/01/2016   Procedure: RIGHT TOTAL HIP ARTHROPLASTY ANTERIOR APPROACH;  Surgeon: Lonni CINDERELLA Poli, MD;  Location: [facility] OR;  Service: Orthopedics;  Laterality: Right;   ULTRASOUND GUIDANCE FOR VASCULAR ACCESS Left 03/24/2024   Procedure: ULTRASOUND GUIDANCE, FOR VASCULAR ACCESS;  Surgeon: Magda Debby SAILOR, MD;  Location: [facility] OR;  Service: Vascular;  Laterality: Left;   Patient Active Problem List   Diagnosis Date Noted   Pain of left upper extremity 04/24/2024   Acute postoperative anemia due to greater  than expected blood loss 03/26/2024   Obesity, class 1 03/26/2024   Aneurysm of axillary artery 03/24/2024   Axillary artery injury 03/12/2024   Acute CVA (cerebrovascular accident) (HCC) 03/02/2024   Essential hypertension 03/02/2024   Dyslipidemia 03/02/2024   Type 2 diabetes mellitus with hyperlipidemia (HCC) 03/02/2024   Erectile dysfunction 02/24/2023   Chronic eczematous otitis externa of both ears 02/05/2023   Tobacco abuse 12/25/2022   Malignant neoplasm of upper lobe of right lung (HCC) 09/25/2022   Cervical spondylosis with myelopathy 08/01/2022   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   DDD (degenerative disc disease), cervical 05/21/2022   Osteoarthritis of both hands 05/19/2022   History of GI bleed 11/13/2021   Insomnia 09/13/2021   Cirrhosis of liver (HCC) 11/08/2018   Barrett's esophagus 08/04/2018   Vitamin D  deficiency 06/30/2018   Alcohol  abuse 06/30/2018   DM type 2 causing vascular disease (HCC) 08/17/2017   Chronic combined systolic and diastolic CHF (congestive heart failure) (HCC) 05/15/2017   OSA (obstructive sleep apnea) 04/28/2017   Depression with anxiety 04/15/2017   GERD (gastroesophageal reflux disease) 09/23/2016   Asthmatic bronchitis , chronic (HCC) 08/27/2015   Mixed hyperlipidemia 08/27/2015   Class 1 obesity due to excess calories with serious comorbidity and body mass index (BMI) of 32.0 to 32.9 in adult 08/27/2015   CAD (coronary artery disease) 05/31/2015   Essential hypertension, benign 02/09/2015    PCP: Cook, Jayce G, DO   REFERRING PROVIDER: Noralee Elidia Sieving*   REFERRING DIAG: left axillary pseudoaneurysm   Rationale for Evaluation and Treatment:  Rehabiliation  THERAPY DIAG:  Acute pain of left shoulder  Muscle weakness (generalized)  ONSET DATE: 03/11/24   SUBJECTIVE:  SUBJECTIVE STATEMENT: Pt states his hand has some electrical shocks and tingling in it. It feels like it itches all the time.  He states he is supposed to follow up with Dr. Lucillie today. He has been trying to use his arm more as much as he can but this is difficult still.   PERTINENT HISTORY:  past medical history of diabetes mellitus type 2, hypertension, CAD, liver cirrhosis, anxiety, depression, OSA, CVA w/ left-sided weakness who had a recent hospitalization 7/11 to 03/19/24 post traumatic left shoulder dislocation, complicated with left axillary hematoma with left axillary pseudoaneurysm. On 07/12 he underwent open exposure and repair of the left axillary pseudoaneurysm and evacuation of left axillary hematoma.   PAIN:  NPRS scale: 5/10 Pain location:left shoulder/axillary, left forearm and wrist Pain description: constant, achy, sharp Aggravating factors: any use of this left arm. Relieving factors: rest, meds   PRECAUTIONS: ,  Shoulder  RED FLAGS: None   WEIGHT BEARING RESTRICTIONS:  Not anymore  FALLS:  Has patient fallen in last 6 months? Yes. Number of falls 1   OCCUPATION:  disabled  PLOF:  Independent with basic ADLs  PATIENT GOALS:  Reduce pain and get his arm back to normal  OBJECTIVE:  Note: Objective measures were completed at Evaluation unless otherwise noted.  DIAGNOSTIC FINDINGS:  Chest CT IMPRESSION: 1. Enlarging left axillary hematoma with arterial contrast extravasation from the posterior wall of the left axillary artery into a 1.5 x 1.8 cm pseudoaneurysm, with additional 1.8 x 1.1 cm pocket of contrast more posteriorly within the collection. 2. Increased heterogeneous hematoma in the left subscapularis region measuring 7.3 x 4.8 cm, but no focal contrast is seen within this. 3. Emphysema. 4. Right apical fiducial markers and post XRT changes, with treated nodule just above the fiducial markers today measuring 8  mm, previously 9 mm. 5. Aortic and coronary artery atherosclerosis.  Right aortic arch. 6. Enlarged pulmonary trunk 3.3 cm indicating arterial hypertension. 7. Cirrhosis and splenomegaly.  Prominent patent portal vein 1.7 cm. 8. Critical Value/emergent results were called by telephone at the time of interpretation on 03/24/2024 at 5:18 am to provider [facility] , who verbally acknowledged these results.  Shoulder XR IMPRESSION: No acute bony abnormality.   Postoperative changes in the left axilla.  No imaging for left elbow/wrist  PATIENT SURVEYS:  Patient-Specific Activity Scoring Scheme  0 represents "unable to perform." 10 represents "able to perform at prior level. 0 1 2 3 4 5 6 7 8 9  10 (Date and Score)   Activity Eval  06/21/24  1. Raising left arm  0 0   2. Putting on and off shirt 1  8  3.     4.    5.    Score 0.5/10 4/10   Total score = sum of the activity scores/number of activities Minimum detectable change (90%CI) for average score = 2 points Minimum detectable change (90%CI) for single activity score = 3 points     EDEMA:  Yes: moderate edema noted in left upper extremity from shoulder to hand  Observation:  2 incisions noted, one at proximal chest, one at axilla, both incisions closed with stitches and appear closed without any drainage or signs of infection at the moment. Dressing was removed and changed for him at eval.     UPPER EXTREMITY ROM:  AROM/Passive ROM Left eval Left 04/13/24 Left 05/24/24 Left 06/21/24  Shoulder flexion /50 /120 30/150 40/  Shoulder extension      Shoulder abduction /  50 /100 20/140 22/  Shoulder adduction      Shoulder extension      Shoulder internal rotation /50     Shoulder external rotation /40 /70    Elbow flexion      Elbow extension      Wrist flexion      Wrist extension      Wrist ulnar deviation      Wrist radial deviation      Wrist pronation      Wrist supination       (Blank rows = not  tested)   UPPER EXTREMITY MMT:  MMT Left eval Left 05/24/24 Left 06/21/24  Shoulder flexion 1 2 2   Shoulder extension     Shoulder abduction 1 2 2   Shoulder adduction     Shoulder extension     Shoulder internal rotation 1 2 4   Shoulder external rotation 1 2 3   Middle trapezius     Lower trapezius     Elbow flexion 1 3 4   Elbow extension 2 3 4   Wrist flexion     Wrist extension     Wrist ulnar deviation     Wrist radial deviation     Wrist pronation     Wrist supination     Grip strength      (Blank rows = not tested)                                                                                                                              TREATMENT DATE:  06/27/24 Theractivity UBE L1 X 10 min with left hand wrapped to handle with ace wrap to maintain grip Standing p ball roll outs on mat table for AAROM flexion, abduction, circles CW & CCW X 15 reps each for reaching Gripping X 20 Standing chest press AROM 2X10 Standing scapular retractions 2X10 Blaze pods 3pods, using UE to turn off working on reaching horizontal abduction/adduction and hand control, pods placed on high mat, performed 30 sec X 3 with 30 sec rest in between Blaze pods 3 pods, using UE to turn off working on reaching flexion/extension and hand control, pods placed on high mat, performed 30 sec X 3 with 30 sec rest in between  Therex: Supine shoulder AAROM hands clasped X 10 reps Supine shoulder flexion eccentrics with PT assist x15 Supine chest press AROM X 2X10 Supine shoulder ER against manual PT resistance X 10 Supine shoulder IR against manual PT resistance X 10 Supine shoulder abduction AROM 2 X 10 reps  06/21/24 Theractivity UBE L1 X 6 min with left hand wrapped to handle with ace wrap to maintain grip Standing p ball roll outs on mat table for AAROM flexion, abduction, circles CW & CCW X 15 reps each for reaching Gripping towel X 20 Blaze pods 4 pods, using UE to turn off working on  reaching horizontal abduction/adduction and hand control, pods placed on high mat, performed 30 sec X  3 with 30 sec rest in between Blaze pods 3 pods, using UE to turn off working on reaching flexion/extension and hand control, pods placed on high mat, performed 30 sec X 3 with 30 sec rest in between  Therex: Supine shoulder flexion eccentrics with PT assist x15 Supine chest press AROM X 15 Supine shoulder ER against manual PT resistance X 10 Supine shoulder IR against manual PT resistance X 10 Supine shoulder abduction AROM X 15 reps    PATIENT EDUCATION: Education details: HEP, PT plan of care, selfcare Person educated: Patient Education method: Explanation, Demonstration, Verbal cues, and Handouts Education comprehension: verbalized understanding, further education recommended   HOME EXERCISE PROGRAM: Access Code: [email or web address] URL: [email or web address] Date: 03/31/2024 Prepared by: Redell Moose  Exercises - Circular Shoulder Pendulum with Table Support  - 2-3 x daily - 7 x weekly - 1 sets - 15 reps - Seated Shoulder Abduction Towel Slide at Table Top  - 2-3 x daily - 6 x weekly - 1 sets - 10 reps - 5 sec hold - Seated Shoulder Flexion Towel Slide at Table Top  - 2-3 x daily - 6 x weekly - 1 sets - 10 reps - 5 sec hold - Seated Shoulder External Rotation PROM on Table  - 2-3 x daily - 6 x weekly - 1 sets - 10 reps - 5 sec hold - Seated Gripping Towel  - 2-3 x daily - 6 x weekly - 1 sets - 20 reps - Standing Elbow Flexion Extension AROM  - 2-3 x daily - 6 x weekly - 1 sets - 10 reps  ASSESSMENT:  CLINICAL IMPRESSION: PT continues to work to improve his left UE function as tolerated. He will follow up with MD today and hopefully get NCV results. Skilled PT remains necessary to improve functional use of left UE.   OBJECTIVE IMPAIRMENTS: decreased activity tolerance for ADL's, difficulty walking, decreased balance, decreased endurance, decreased mobility, decreased ROM,  decreased strength, impaired flexibility, impaired UE use, and pain.  ACTIVITY LIMITATIONS: bending, lifting, carry, reaching, locomotion, cleaning, community activity, driving,  PERSONAL FACTORS: see above PMH  also affecting patient's functional outcome.  REHAB POTENTIAL: Good  CLINICAL DECISION MAKING: Evolving/moderate complexity  EVALUATION COMPLEXITY: Moderate    GOALS: Short term PT Goals Target date: 04/28/2024   Pt will be I and compliant with HEP. Baseline:  Goal status: MET 04/18/24 Pt will decrease pain by 25% overall Baseline: Goal status: MET 04/18/24  Long term PT goals Target date:06/23/2024  Pt will improve right shoulder and elbow AROM to [facility] to improve functional mobility Baseline: Goal status: ongoing 10/21 Pt will improve  right arm strength to at least 4+/5 MMT to improve functional strength Baseline: Goal status: ongoing 10/21 Pt will improve Patient specific functional scale (PSFS) to at least 7/10 to show improved function level Baseline: Goal status: ongoing 10/21 Pt will reduce pain to overall less than 3/10 with usual activity Baseline: Goal status: ongoing 10/21 PLAN: PT FREQUENCY: 1-3 times per week   PT DURATION: 6-8 weeks  PLANNED INTERVENTIONS (unless contraindicated):  97110-Therapeutic exercises, 97530- Therapeutic activity, 97112- Neuromuscular re-education, 97535- Self Care, [id number]- Manual therapy, [id number]- Electrical stimulation (unattended), [id number]- Vasopneumatic device, [id number]- Ultrasound, and 97033- Ionotophoresis 4mg /ml Dexamethasone   PLAN FOR NEXT SESSION: progress strength as tolerated, look out for NCV results.    Redell JONELLE Moose, PT,DPT 06/27/2024, 8:34 AM

## 2024-06-27 NOTE — Progress Notes (Signed)
 Chief Complaint:  1. Brachial plexus injury, left, initial encounter  pregabalin (LYRICA) 75 mg capsule      HPI:   The Randy Hebert is a 58 y.o., male  History of Present Illness The patient presents for evaluation of shoulder pain.  He reports a gradual improvement in his shoulder condition, although he continues to experience soreness and swelling. He has been engaging in physical therapy, which includes exercises involving a pulley system. He also reports a sensation akin to an electric shock when bending his thumb tip, along with persistent tingling and numbness. His current medication regimen includes Lyrica, taken twice daily, which he finds somewhat beneficial. However, he notes that the pain intensifies on days following physical therapy sessions, to the extent that it disrupts his sleep. He has attempted to alleviate this by taking an additional two tablets, but without success. The onset of these symptoms was approximately 4 months ago, on 03/11/2024. SABRA  Pertinent highlights from PMHx and/or other injuries include: ________________________________________________________________________  Problem List[1]  Medical History[2]  Past Surgical History: Surgical History[3]  Social History:  Social History   Socioeconomic History  . Marital status: Married    Spouse name: Not on file  . Number of children: Not on file  . Years of education: Not on file  . Highest education level: Not on file  Occupational History  . Not on file  Tobacco Use  . Smoking status: Every Day    Current packs/day: 1.00    Types: Cigarettes  . Smokeless tobacco: Never  Substance and Sexual Activity  . Alcohol  use: Yes    Alcohol /week: 10.0 standard drinks of alcohol   . Drug use: Unknown    Types: Cocaine, Crack cocaine    Comment: Drug use: Drug Use: Not Currently; as of 2023 has been clean 97yrs, last use 01/2015  . Sexual activity: Not on file  Other Topics Concern  . Not on file   Social History Narrative  . Not on file   Social Drivers of Health   Food Insecurity: No Food Insecurity (03/24/2024)   Received from [facility]   Food vital sign   . Within the past 12 months, you worried that your food would run out before you got money to buy more: Never true   . Within the past 12 months, the food you bought just didn't last and you didn't have money to get more: Never true  Transportation Needs: No Transportation Needs (03/24/2024)   Received from [facility] - Transportation   . In the past 12 months, has lack of transportation kept you from medical appointments or from getting medications?: No   . In the past 12 months, has lack of transportation kept you from meetings, work, or from getting things needed for daily living?: No  Safety: Not At Risk (03/24/2024)   Received from [facility]   Safety   . Within the last year, have you been afraid of your partner or ex-partner?: No   . Within the last year, have you been humiliated or emotionally abused in other ways by your partner or ex-partner?: No   . Within the last year, have you been kicked, hit, slapped, or otherwise physically hurt by your partner or ex-partner?: No   . Within the last year, have you been raped or forced to have any kind of sexual activity by your partner or ex-partner?: No  Living Situation: Low Risk  (03/24/2024)   Received from [facility]  Living Situation   . In the last 12 months, was there a time when you were not able to pay the mortgage or rent on time?: No   . In the past 12 months, how many times have you moved where you were living?: 0   . At any time in the past 12 months, were you homeless or living in a shelter (including now)?: No    Family History: Family History[4]   Allergies: Allergies[5]  Medications:  has a current medication list which includes the following prescription(s): acetaminophen , albuterol , albuterol  hfa, amlodipine , ascorbic acid , aspirin ,  atorvastatin , azelastine , budesonide -formoterol , cholecalciferol , citalopram , cyclosporine , farxiga , trulicity , ferrous sulfate , furosemide , gabapentin , glipizide , lactulose , magnesium  gluconate, metformin , mometasone , nicotine , nitroglycerin , olopatadine , pantoprazole , pregabalin, tramadol , and varenicline  tartrate.   Review of Systems Review of Systems - all systems were reviewed. Negative except HPI and PMH  Objective: Vital Signs: Vitals:   06/27/24 1620  BP: 135/76  Pulse: 83    PHYSICAL EXAM  GENERAL: No acute distress.  Alert and oriented. Well nourished and well hydrated. Appears stated age.  HEENT : Normocephalic, atraumatic.  Extraocular movements intact.  Mucous membranes moist  NECK: Supple, trachea midline.   MUSCULOSKELETAL EXAM  Physical Exam shoulder abduction 20 Elevation 0 Extnesion 20 Deltoid 2/5 Biceps 4/5 Triceps 4/5 Wrist extension 4/5 Flexion 4/5 EPL 0/5 Index FDP 0/5 Long ring small 4/5 Decreased C5,6 sensation    Vascular exam:  hands are warm,  normal capillary refill  Assessment:  58 y.o. male with the following musculoskeletal problems:   1.  1. Brachial plexus injury, left, initial encounter  pregabalin (LYRICA) 75 mg capsule      Recommendations:  Assessment & Plan . He reports that physical therapy is helping, but he still experiences significant pain, especially on therapy days. He is advised to continue with physical therapy and use a squeeze ball for hand exercises. The dosage of Lyrica will be increased to 150mg  bid,  with a prescription for 3 refills provided. He is instructed to inform his physical therapist that there are no restrictions on his activities. If there is no improvement in shoulder muscle strength within the next few months, surgical intervention may be considered.  As scheduled         [1] Patient Active Problem List Diagnosis  . Alcohol  abuse  . Asthmatic bronchitis , chronic  . Cervical  radiculopathy  . CAD (coronary artery disease)  . Barrett's esophagus  . Chronic combined systolic and diastolic CHF (congestive heart failure) (HCC)  . Cirrhosis of liver (HCC)  . Current smoker  . Depression with anxiety  . DM type 2 causing vascular disease (HCC)  . Essential hypertension, benign  . GERD (gastroesophageal reflux disease)  . Insomnia  . Mixed hyperlipidemia  . OSA (obstructive sleep apnea)  . Seasonal and perennial allergic rhinitis  . Vitamin D  deficiency  . Cervical stenosis of spinal canal  . DDD (degenerative disc disease), cervical  . Vocal cord dysfunction  . Kyphosis of cervical region  . Dyspnea  . Throat papilloma  . Cervical spondylosis with myelopathy  . Anxiety  . Lung mass  . Iron deficiency  . Brachial plexus injury, left, initial encounter  . Shoulder dislocation, left, sequela  [2] Past Medical History: Diagnosis Date  . Anxiety   . Asthmatic bronchitis , chronic    (CMD)   . Brachial plexus injury, left, initial encounter 05/20/2024  . CHF (congestive heart failure)    (CMD)   . Chronic combined  systolic and diastolic CHF (congestive heart failure)    (CMD) 05/15/2017   Last Assessment & Plan:   Formatting of this note might be different from the original.  Euvolemic today.  SABRA COPD (chronic obstructive pulmonary disease)    (CMD)   . Coronary artery disease   . Diabetes mellitus    (CMD)   . Headache   . Heart failure    (CMD)   . Hypertension   . Iron deficiency   . Liver disease   . Lung mass    [place] [facility] 08/14/2022, probable stage 1 lung cancer, has been referred to CTS   . Myocardial infarction    (CMD)   . Shoulder dislocation, left, sequela 05/20/2024  [3] Past Surgical History: Procedure Laterality Date  . HAND SURGERY     Procedure: HAND SURGERY  . TOTAL HIP ARTHROPLASTY Right 2017   Procedure: TOTAL HIP REPLACEMENT  [4] Family History Problem Relation Name Age of Onset  . Cancer Mother    . Heart disease  Father    . Stroke Father    . Diabetes Maternal Uncle    . Anesthesia problems Neg Hx    [5] Allergies Allergen Reactions  . Carvedilol  Anaphylaxis  . Lisinopril  Anaphylaxis    Angioedema, anaphylaxis, required intubation  . Varenicline  Other (See Comments)    Nightmares  . Amoxicillin  GI Intolerance    Did it involve swelling of the face/tongue/throat, SOB, or low BP? No Did it involve sudden or severe rash/hives, skin peeling, or any reaction on the inside of your mouth or nose? No Did you need to seek medical attention at a hospital or doctor's office? No When did it last happen? childhood reaction      If all above answers are NO, may proceed with cephalosporin use.

## 2024-06-28 ENCOUNTER — Ambulatory Visit: Admitting: Physical Therapy

## 2024-06-28 ENCOUNTER — Ambulatory Visit (HOSPITAL_COMMUNITY)
Admission: RE | Admit: 2024-06-28 | Discharge: 2024-06-28 | Disposition: A | Discharge status: Home / Self Care | Source: Ambulatory Visit | Attending: Vascular Surgery | Admitting: Vascular Surgery

## 2024-06-28 ENCOUNTER — Ambulatory Visit (INDEPENDENT_AMBULATORY_CARE_PROVIDER_SITE_OTHER): Admitting: Physician Assistant

## 2024-06-28 VITALS — BP 121/77 | HR 71 | Temp 97.8°F | Ht 65.0 in | Wt 178.1 lb

## 2024-06-28 DIAGNOSIS — S45001D Unspecified injury of axillary artery, right side, subsequent encounter: Secondary | ICD-10-CM | POA: Diagnosis not present

## 2024-06-28 DIAGNOSIS — I721 Aneurysm of artery of upper extremity: Secondary | ICD-10-CM

## 2024-06-28 DIAGNOSIS — S45001S Unspecified injury of axillary artery, right side, sequela: Secondary | ICD-10-CM

## 2024-06-29 NOTE — Progress Notes (Signed)
 Office Note   History of Present Illness   Randy Hebert is a 58 y.o. (Jul 06, 1966) male who presents for follow up. He recently underwent open exposure and repair of a left axillary pseudoaneurysm and evacuation of a left axillary hematoma on 03/12/2024 by Dr. Serene.  This was done for active extravasation from the left axillary artery after a dislocated shoulder.  He subsequently developed significant swelling and pain in his left chest and axillary region.  He eventually required readmission and underwent aortic arch angiogram with left axillary artery angioplasty and stenting and incision and drainage of a left axillary hematoma on 03/24/2024 by Dr. Magda.  His wound bed cultures were positive for Enterococcus faecalis and Streptococcus constellatus.  ID recommended 6 weeks of Augmentin  postoperatively.    He returns today for follow-up.  He says he is doing fairly well at today's office visit.  He says that he is still undergoing physical therapy for his left upper extremity mobility.  He says that his shoulder is getting stronger slowly.  He is also seeing a specialist at [facility] for treatment of his brachial plexus injury.  He denies any rest pain or claudication of the left upper extremity.  He denies any coldness of the left hand.  Current Outpatient Medications  Medication Sig Dispense Refill   acetaminophen  (TYLENOL ) 500 MG tablet Take 2 tablets (1,000 mg total) by mouth every 8 (eight) hours as needed for mild pain or fever.     amLODipine  (NORVASC ) 10 MG tablet Take 1 tablet (10 mg total) by mouth daily. 90 tablet 1   Ascorbic Acid  (VITAMIN C  PO) Take 1 tablet by mouth daily.     aspirin  EC 81 MG tablet Take 1 tablet (81 mg total) by mouth daily. Swallow whole. 30 tablet 12   atorvastatin  (LIPITOR ) 80 MG tablet Take 1 tablet (80 mg total) by mouth daily. 30 tablet 2   Cholecalciferol  (VITAMIN D -3 PO) Take 1 capsule by mouth daily.     citalopram  (CELEXA ) 20 MG tablet Take 1  tablet (20 mg total) by mouth daily. 90 tablet 1   clopidogrel  (PLAVIX ) 75 MG tablet Take 1 tablet (75 mg total) by mouth daily. 30 tablet 6   dapagliflozin  propanediol (FARXIGA ) 10 MG TABS tablet Take 1 tablet (10 mg total) by mouth daily. 90 tablet 0   Dulaglutide  (TRULICITY ) 1.5 MG/0.5ML SOAJ Inject 1.5 mg into the skin once a week. 6 mL 0   ezetimibe  (ZETIA ) 10 MG tablet TAKE ONE TABLET BY MOUTH DAILY 90 tablet 1   Ferrous Sulfate  (IRON PO) Take 1 tablet by mouth daily.     fluconazole  (DIFLUCAN ) 150 MG tablet Take 1 tablet (150 mg total) by mouth daily. 5 tablet 0   furosemide  (LASIX ) 40 MG tablet Take 1 tablet (40 mg total) by mouth daily as needed (excessive swelling). 30 tablet 6   MAGNESIUM  PO Take 1 tablet by mouth 2 (two) times daily.     nitroGLYCERIN  (NITROSTAT ) 0.4 MG SL tablet Place 1 tablet (0.4 mg total) under the tongue every 5 (five) minutes x 3 doses as needed (if no relief after 3rd dose, proceed to ED or call 911). 25 tablet 3   ondansetron  (ZOFRAN -ODT) 4 MG disintegrating tablet Take 1 tablet (4 mg total) by mouth every 8 (eight) hours as needed for nausea or vomiting. 40 tablet 1   oxyCODONE -acetaminophen  (PERCOCET/ROXICET) 5-325 MG tablet Take 1 tablet by mouth every 4 (four) hours as needed for severe pain (  pain score 7-10). 15 tablet 0   pantoprazole  (PROTONIX ) 40 MG tablet TAKE (1) TABLET BY MOUTH TWICE DAILY BEFORE A MEAL. 180 tablet 1   pregabalin (LYRICA) 75 MG capsule Take 75 mg by mouth daily.     RESTASIS  0.05 % ophthalmic emulsion Place 1 drop into both eyes 2 (two) times daily.     SYMBICORT  160-4.5 MCG/ACT inhaler INHALE 2 PUFFS INTO THE LUNGS FIRST THING IN THE MORNING AND 2 PUFFS ABOUT 12 HOURS LATER. 10.2 g 12   naproxen  (NAPROSYN ) 375 MG tablet Take 1 tablet (375 mg total) by mouth 2 (two) times daily as needed for moderate pain (pain score 4-6). (Patient not taking: Reported on 06/28/2024) 20 tablet 0   traMADol  (ULTRAM ) 50 MG tablet Take 1-2 tablets (50-100  mg total) by mouth every 8 (eight) hours as needed. (Patient not taking: Reported on 06/28/2024) 30 tablet 0   No current facility-administered medications for this visit.    REVIEW OF SYSTEMS (negative unless checked):   Cardiac:  []  Chest pain or chest pressure? []  Shortness of breath upon activity? []  Shortness of breath when lying flat? []  Irregular heart rhythm?  Vascular:  []  Pain in calf, thigh, or hip brought on by walking? []  Pain in feet at night that wakes you up from your sleep? []  Blood clot in your veins? []  Leg swelling?  Pulmonary:  []  Oxygen  at home? []  Productive cough? []  Wheezing?  Neurologic:  []  Sudden weakness in arms or legs? []  Sudden numbness in arms or legs? []  Sudden onset of difficult speaking or slurred speech? []  Temporary loss of vision in one eye? []  Problems with dizziness?  Gastrointestinal:  []  Blood in stool? []  Vomited blood?  Genitourinary:  []  Burning when urinating? []  Blood in urine?  Psychiatric:  []  Major depression  Hematologic:  []  Bleeding problems? []  Problems with blood clotting?  Dermatologic:  []  Rashes or ulcers?  Constitutional:  []  Fever or chills?  Ear/Nose/Throat:  []  Change in hearing? []  Nose bleeds? []  Sore throat?  Musculoskeletal:  []  Back pain? []  Joint pain? []  Muscle pain?   Physical Examination   Vitals:   06/28/24 1349  BP: 121/77  Pulse: 71  Temp: 97.8 F (36.6 C)  TempSrc: Temporal  Weight: 178 lb 2.1 oz (80.8 kg)  Height: 5' 5 (1.651 m)   Body mass index is 29.64 kg/m.  General:  WDWN in NAD; vital signs documented above Gait: Not observed HENT: WNL, normocephalic Pulmonary: normal non-labored breathing  Cardiac: regular Abdomen: soft, NT, no masses Skin: without rashes Vascular Exam/Pulses: palpable left radial pulse Extremities: improved mobility of the left shoulder, elbow, and hand. Some grip strength to the left hand Neurologic: A&O X 3 Psychiatric:  The  pt has Normal affect.  Studies: LUE Arterial Duplex (06/28/2024) Patent arterial flow in the left upper extremity without stenosis.  Patent left axillary artery stent  Medical Decision Making   Randy Hebert is a 58 y.o. male who presents for follow up  The patient recently underwent open repair of a left axillary artery pseudoaneurysm, followed by recurrent hematoma requiring left axillary artery stenting and hematoma washout.  His wound cultures were positive for Enterococcus faecalis and Streptococcus constellatus and ID recommended Augmentin  for 6 weeks postop  Left upper extremity arterial duplex demonstrates a patent left axillary artery stent without stenosis.  The remainder the arterial system in the left arm is also patent without stenosis The patient says that his left upper extremity  is slowly recovering.  He continues to go to physical therapy and has improved strength and range of motion of his shoulder and hand.  He is also seeing a specialist for brachial plexus injury On exam he has some grip strength in the left hand.  He has easily palpable left brachial and radial pulses He will continue his aspirin , statin, and Plavix  and follow-up with our office in 6 months with LUE arterial duplex   Ahmed Holster PA-C [facility] [place] Office: [phone]  Clinic MD: Magda

## 2024-06-30 ENCOUNTER — Other Ambulatory Visit: Payer: Self-pay | Admitting: *Deleted

## 2024-06-30 ENCOUNTER — Ambulatory Visit (INDEPENDENT_AMBULATORY_CARE_PROVIDER_SITE_OTHER): Admitting: Family

## 2024-06-30 ENCOUNTER — Encounter: Payer: Self-pay | Admitting: Family

## 2024-06-30 ENCOUNTER — Other Ambulatory Visit: Payer: Self-pay

## 2024-06-30 VITALS — BP 124/84 | HR 74 | Temp 98.0°F | Ht 65.0 in | Wt 172.0 lb

## 2024-06-30 DIAGNOSIS — I721 Aneurysm of artery of upper extremity: Secondary | ICD-10-CM | POA: Diagnosis not present

## 2024-06-30 DIAGNOSIS — A491 Streptococcal infection, unspecified site: Secondary | ICD-10-CM

## 2024-06-30 DIAGNOSIS — A498 Other bacterial infections of unspecified site: Secondary | ICD-10-CM | POA: Diagnosis not present

## 2024-06-30 NOTE — Progress Notes (Signed)
 Subjective:   Patient ID: Randy Hebert, male    DOB: [birth year], 58 y.o.   MRN: [id number]  Chief Complaint  Patient presents with   Follow-up    Off Augmentin     HPI:  Randy Hebert is a 58 y.o. male recent left axillary pseudoaneurysm status postrepair with course complicated by fall and recurrence of left axillary pseudoaneurysm status post axillary artery angioplasty and stenting with incision and drainage of left axillary hematoma and cultures positive for Enterococcus faecalis and Streptococcus constellatus last seen on 04/21/2024 having had discontinued his antibiotics for 2 weeks with plan of care for reinitiating 2 weeks of Augmentin  along with ondansetron  as needed for nausea.  In the interim has been seen by vascular surgery with no further complications.  Here today for follow-up.  Randy Hebert has been doing okay since his last office visit and completed his Augmentin  as prescribed with no adverse side effects and using the ondansetron  as needed.  Continues to have pain from his left elbow down which he is working with orthopedics.  No further fevers, chills, or night sweats.  Has been off antibiotics for at least a month and a half.  Allergies  Allergen Reactions   Coreg  [Carvedilol ] Other (See Comments)    Sinus pause on telemetry >3 seconds. Longest one 9 sec. No AV nodal agent   Zestril  [Lisinopril ] Anaphylaxis, Shortness Of Breath and Swelling    Angioedema, required intubation and mechanical ventilation   Chantix  [Varenicline ] Other (See Comments)    Nightmares Insomnia    Amoxil  [Amoxicillin ] Nausea Only      Outpatient Medications Prior to Visit  Medication Sig Dispense Refill   acetaminophen  (TYLENOL ) 500 MG tablet Take 2 tablets (1,000 mg total) by mouth every 8 (eight) hours as needed for mild pain or fever.     amLODipine  (NORVASC ) 10 MG tablet Take 1 tablet (10 mg total) by mouth daily. 90 tablet 1   Ascorbic Acid  (VITAMIN C  PO) Take 1 tablet by mouth  daily.     aspirin  EC 81 MG tablet Take 1 tablet (81 mg total) by mouth daily. Swallow whole. 30 tablet 12   atorvastatin  (LIPITOR ) 80 MG tablet Take 1 tablet (80 mg total) by mouth daily. 30 tablet 2   Cholecalciferol  (VITAMIN D -3 PO) Take 1 capsule by mouth daily.     citalopram  (CELEXA ) 20 MG tablet Take 1 tablet (20 mg total) by mouth daily. 90 tablet 1   clopidogrel  (PLAVIX ) 75 MG tablet Take 1 tablet (75 mg total) by mouth daily. 30 tablet 6   dapagliflozin  propanediol (FARXIGA ) 10 MG TABS tablet Take 1 tablet (10 mg total) by mouth daily. 90 tablet 0   Dulaglutide  (TRULICITY ) 1.5 MG/0.5ML SOAJ Inject 1.5 mg into the skin once a week. 6 mL 0   ezetimibe  (ZETIA ) 10 MG tablet TAKE ONE TABLET BY MOUTH DAILY 90 tablet 1   Ferrous Sulfate  (IRON PO) Take 1 tablet by mouth daily.     furosemide  (LASIX ) 40 MG tablet Take 1 tablet (40 mg total) by mouth daily as needed (excessive swelling). 30 tablet 6   MAGNESIUM  PO Take 1 tablet by mouth 2 (two) times daily.     nitroGLYCERIN  (NITROSTAT ) 0.4 MG SL tablet Place 1 tablet (0.4 mg total) under the tongue every 5 (five) minutes x 3 doses as needed (if no relief after 3rd dose, proceed to ED or call 911). 25 tablet 3   pantoprazole  (PROTONIX ) 40 MG tablet TAKE (1) TABLET  BY MOUTH TWICE DAILY BEFORE A MEAL. 180 tablet 1   pregabalin (LYRICA) 75 MG capsule Take 75 mg by mouth daily.     RESTASIS  0.05 % ophthalmic emulsion Place 1 drop into both eyes 2 (two) times daily.     SYMBICORT  160-4.5 MCG/ACT inhaler INHALE 2 PUFFS INTO THE LUNGS FIRST THING IN THE MORNING AND 2 PUFFS ABOUT 12 HOURS LATER. 10.2 g 12   fluconazole  (DIFLUCAN ) 150 MG tablet Take 1 tablet (150 mg total) by mouth daily. (Patient not taking: Reported on 06/30/2024) 5 tablet 0   naproxen  (NAPROSYN ) 375 MG tablet Take 1 tablet (375 mg total) by mouth 2 (two) times daily as needed for moderate pain (pain score 4-6). (Patient not taking: Reported on 06/30/2024) 20 tablet 0   ondansetron   (ZOFRAN -ODT) 4 MG disintegrating tablet Take 1 tablet (4 mg total) by mouth every 8 (eight) hours as needed for nausea or vomiting. (Patient not taking: Reported on 06/30/2024) 40 tablet 1   oxyCODONE -acetaminophen  (PERCOCET/ROXICET) 5-325 MG tablet Take 1 tablet by mouth every 4 (four) hours as needed for severe pain (pain score 7-10). (Patient not taking: Reported on 06/30/2024) 15 tablet 0   traMADol  (ULTRAM ) 50 MG tablet Take 1-2 tablets (50-100 mg total) by mouth every 8 (eight) hours as needed. (Patient not taking: Reported on 06/30/2024) 30 tablet 0   No facility-administered medications prior to visit.     Past Medical History:  Diagnosis Date   ACE inhibitor-aggravated angioedema    Allergy     Anemia    Angio-edema    Anxiety    Arthritis    Asthma    hip replacement   Back pain    Bradycardia 04/28/2017   Bulging of cervical intervertebral disc    CAD (coronary artery disease)    lateral STEMI 02/06/2015 00% D1 occlusion treated with Promus Premier 2.5 mm x 16 mm DES, 70% ramus stenosis, 40% mid RCA stenosis, 45% distal RCA stenosis, EF 45-50%   CHF (congestive heart failure) (HCC)    COPD (chronic obstructive pulmonary disease) (HCC)    Depression    Diabetes mellitus without complication (HCC)    Difficult intubation    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   Dry eye    Dyspnea    Early satiety 09/23/2016   Fatty liver    GERD (gastroesophageal reflux disease)    HCAP (healthcare-associated pneumonia) 05/15/2017   Headache    Heart murmur    Hip pain    Hyperlipidemia    Hypertension    Lobar pneumonia 05/15/2017   Melena 08/04/2018   MI (myocardial infarction) [facility])    Myocardial infarction (HCC)    Neck pain    Non-ST elevation (NSTEMI) myocardial infarction (HCC) 04/27/2017   NSTEMI (non-ST elevated myocardial infarction) (HCC) 04/26/2017   Otitis media    Pleurisy    Pneumonia due to COVID-19 virus 10/07/2019   Rectal bleeding 11/08/2018    Right shoulder pain 03/20/2020   Sinus pause    9 sec sinus pause on telemetry after started on coreg  after MI, avoid AV nodal blocking agent   Sleep apnea    uses a cpap   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   STEMI (ST elevation myocardial infarction) (HCC) 05/31/2015   Substance abuse (HCC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   Syncope 06/29/2017   Syncope and collapse 05/15/2017   Transaminitis 08/04/2018   Unilateral primary osteoarthritis, right hip 07/01/2016     Past Surgical  History:  Procedure Laterality Date   ARTERY EXPLORATION Left 03/24/2024   Procedure: LEFT ARCH AORTOGRAM, LEFT UPPER EXTREMITY ANGIOGRAM WITH FIRST ORDER CANNULATION, LEFT AXILLARY ANGIOPLASTY AND STENTING, EVACUATION OF LEFT AXILLARY HEMATOMA;  Surgeon: Magda Debby SAILOR, MD;  Location: [facility] OR;  Service: Vascular;  Laterality: Left;   ARTERY REPAIR Left 03/11/2024   Procedure: REPAIR LEFT AXILLARY ARTERY;  Surgeon: Serene Gaile ORN, MD;  Location: [facility] OR;  Service: Vascular;  Laterality: Left;  ARTERIAL REPAIR, LEFT AXILLARY   BIOPSY  10/09/2016   Procedure: BIOPSY;  Surgeon: Lamar CHRISTELLA Hollingshead, MD;  Location: [facility];  Service: Endoscopy;;   BIOPSY  11/28/2019   Procedure: BIOPSY;  Surgeon: Hollingshead Lamar CHRISTELLA, MD;  [facility]: [facility];  Service: Endoscopy;;   BIOPSY  06/15/2023   Procedure: BIOPSY;  Surgeon: Hollingshead Lamar CHRISTELLA, MD;  [facility];  Service: Endoscopy;;   BRONCHIAL BIOPSY  10/16/2022   Procedure: BRONCHIAL BIOPSIES;  Surgeon: Gladis Leonor CHRISTELLA, MD;  Location: [facility] ENDOSCOPY;  Service: Pulmonary;;   BRONCHIAL NEEDLE ASPIRATION BIOPSY  10/16/2022   Procedure: BRONCHIAL NEEDLE ASPIRATION BIOPSIES;  Surgeon: Gladis Leonor CHRISTELLA, MD;  Location: [facility] ENDOSCOPY;  Service: Pulmonary;;   BRONCHIAL WASHINGS  10/16/2022   Procedure: BRONCHIAL WASHINGS;  Surgeon: Gladis Leonor CHRISTELLA, MD;  Location: [facility] ENDOSCOPY;  Service: Pulmonary;;   CARDIAC CATHETERIZATION N/A 02/06/2015   Procedure: Left Heart  Cath and Coronary Angiography;  Surgeon: Alm ORN Clay, MD;  Location: [facility] INVASIVE CV LAB;  Service: Cardiovascular;  Laterality: N/A;   CARDIAC CATHETERIZATION N/A 02/06/2015   Procedure: Coronary Stent Intervention;  Surgeon: Alm ORN Clay, MD;  Location: [facility] INVASIVE CV LAB;  Service: Cardiovascular;  Laterality: N/A;   COLONOSCOPY WITH PROPOFOL  N/A 10/09/2016   Sigmoid and descending colon diverticulosis, four 4-6 mm polyps in sigmoid, one 4 mm polyp in descending. Tubular adenomas and hyperplastic. 5 year surveillance.    COLONOSCOPY WITH PROPOFOL  N/A 11/24/2016   Sigmoid and descending colon diverticulosis, four 4-6 mm polyps in sigmoid, one 4 mm polyp in descending. Tubular adenomas and hyperplastic. 5 year surveillance.    COLONOSCOPY WITH PROPOFOL  N/A 10/18/2021   Carver: nonbleeding internal hemorrhoids, small and large mouth diverticula found in the sigmoid, descending, transverse colon.  A 9 mm sessile polyp was found in the ascending colon that was removed.  4 sessile polyps found in the sigmoid, descending, transverse colon 3 to 5 mm in size, examining otherwise.  Path revealed tubular adenomas.  Repeat due in 5 years for surveillance.   CORONARY ANGIOPLASTY WITH STENT PLACEMENT  01/2015   CORONARY STENT INTERVENTION N/A 04/27/2017   Procedure: CORONARY STENT INTERVENTION;  Surgeon: Mady Bruckner, MD;  Location: [facility] INVASIVE CV LAB;  Service: Cardiovascular;  Laterality: N/A;   ELECTROPHYSIOLOGY STUDY N/A 06/29/2017   Procedure: ELECTROPHYSIOLOGY STUDY;  Surgeon: Waddell Danelle ORN, MD;  Location: [facility] INVASIVE CV LAB;  Service: Cardiovascular;  Laterality: N/A;   ESOPHAGOGASTRODUODENOSCOPY (EGD) WITH PROPOFOL  N/A 10/09/2016   Dr. Hollingshead: LA grade a esophagitis.  Barrett's esophagus, biopsy-proven.  Small hiatal hernia.  EGD February 2019   ESOPHAGOGASTRODUODENOSCOPY (EGD) WITH PROPOFOL  N/A 11/28/2019    salmon-colored esophageal mucosa (Barrett's) small hiatal hernia, portal  hypertensive gastropathy, normal duodenum, 3 year surveillance   ESOPHAGOGASTRODUODENOSCOPY (EGD) WITH PROPOFOL  N/A 09/06/2021   three columns of grade 1 varices in distal esophagus, no stigmata of bleeding or red wale signs. Small hiatal hernia. Mild portal gastropathy. Normal duodenum. Repeat upper endoscopy in 1 year for surveillance.  ESOPHAGOGASTRODUODENOSCOPY (EGD) WITH PROPOFOL  N/A 06/15/2023   Procedure: ESOPHAGOGASTRODUODENOSCOPY (EGD) WITH PROPOFOL ;  Surgeon: Shaaron Lamar HERO, MD;  [facility];  Service: Endoscopy;  Laterality: N/A;  830am, asa 3   FIDUCIAL MARKER PLACEMENT  10/16/2022   Procedure: FIDUCIAL MARKER PLACEMENT;  Surgeon: Gladis Leonor HERO, MD;  Location: [facility] ENDOSCOPY;  Service: Pulmonary;;   INCISION / DRAINAGE HAND / FINGER     LEFT HEART CATH AND CORONARY ANGIOGRAPHY N/A 04/27/2017   Procedure: LEFT HEART CATH AND CORONARY ANGIOGRAPHY;  Surgeon: Mady Bruckner, MD;  [facility]: [facility] INVASIVE CV LAB;  Service: Cardiovascular;  Laterality: N/A;   LOOP RECORDER INSERTION  06/29/2017   Procedure: Loop Recorder Insertion;  Surgeon: Waddell Danelle ORN, MD;  Location: [facility] INVASIVE CV LAB;  Service: Cardiovascular;;   POLYPECTOMY  11/24/2016   Procedure: POLYPECTOMY;  Surgeon: Lamar Hebert Shaaron, MD;  [facility];  Service: Endoscopy;;  descending and sigmoid   POLYPECTOMY  10/18/2021   Procedure: POLYPECTOMY;  Surgeon: Cindie Carlin POUR, DO;  [facility];  Service: Endoscopy;;   TOTAL HIP ARTHROPLASTY Right 07/01/2016   TOTAL HIP ARTHROPLASTY Right 07/01/2016   Procedure: RIGHT TOTAL HIP ARTHROPLASTY ANTERIOR APPROACH;  Surgeon: Bruckner CINDERELLA Poli, MD;  Location: [facility] OR;  Service: Orthopedics;  Laterality: Right;   ULTRASOUND GUIDANCE FOR VASCULAR ACCESS Left 03/24/2024   Procedure: ULTRASOUND GUIDANCE, FOR VASCULAR ACCESS;  Surgeon: Magda Debby SAILOR, MD;  Location: [facility] OR;  Service: Vascular;  Laterality: Left;       Review of Systems   Constitutional:  Negative for chills, diaphoresis, fatigue and fever.  Respiratory:  Negative for cough, chest tightness, shortness of breath and wheezing.   Cardiovascular:  Negative for chest pain.  Gastrointestinal:  Negative for abdominal pain, diarrhea, nausea and vomiting.    Objective:   BP 124/84   Pulse 74   Temp 98 F (36.7 C) (Oral)   Ht 5' 5 (1.651 m)   Wt 172 lb (78 kg)   SpO2 92%   BMI 28.62 kg/m  Nursing note and vital signs reviewed.  Physical Exam Constitutional:      General: He is not in acute distress.    Appearance: He is well-developed.  Cardiovascular:     Rate and Rhythm: Normal rate and regular rhythm.     Heart sounds: Normal heart sounds.  Pulmonary:     Effort: Pulmonary effort is normal.     Breath sounds: Normal breath sounds.  Musculoskeletal:     Comments: Left hand with mild edema and no other obvious deformity or discoloration.  Skin:    General: Skin is warm and dry.  Neurological:     Mental Status: He is alert and oriented to person, place, and time.         04/21/2024   11:42 AM 04/05/2024   10:06 AM 03/21/2024   12:00 PM 03/01/2024   10:58 AM 01/04/2024   11:47 AM  Depression screen PHQ 2/9  Decreased Interest 1 2 0 1 0  Down, Depressed, Hopeless 1 2 0 0 2  PHQ - 2 Score 2 4 0 1 2  Altered sleeping 1 2  1 2   Tired, decreased energy 1 2  1  0  Change in appetite 1 2  0 0  Feeling bad or failure about yourself  0 2  0 0  Trouble concentrating 1 2  0 0  Moving slowly or fidgety/restless 1 2  1  0  Suicidal thoughts 0 0  0 0  PHQ-9 Score 7 16  4 4   Difficult doing work/chores Somewhat difficult Somewhat difficult  Somewhat difficult Somewhat difficult     Assessment & Plan:    Patient Active Problem List   Diagnosis Date Noted   Pain of left upper extremity 04/24/2024   Acute postoperative anemia due to greater than expected blood loss 03/26/2024   Obesity, class 1 03/26/2024   Aneurysm of axillary artery 03/24/2024    Axillary artery injury 03/12/2024   Acute CVA (cerebrovascular accident) (HCC) 03/02/2024   Essential hypertension 03/02/2024   Dyslipidemia 03/02/2024   Type 2 diabetes mellitus with hyperlipidemia (HCC) 03/02/2024   Erectile dysfunction 02/24/2023   Chronic eczematous otitis externa of both ears 02/05/2023   Tobacco abuse 12/25/2022   Malignant neoplasm of upper lobe of right lung (HCC) 09/25/2022   Cervical spondylosis with myelopathy 08/01/2022   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   DDD (degenerative disc disease), cervical 05/21/2022   Osteoarthritis of both hands 05/19/2022   History of GI bleed 11/13/2021   Insomnia 09/13/2021   Cirrhosis of liver (HCC) 11/08/2018   Barrett's esophagus 08/04/2018   Vitamin D  deficiency 06/30/2018   Alcohol  abuse 06/30/2018   DM type 2 causing vascular disease (HCC) 08/17/2017   Chronic combined systolic and diastolic CHF (congestive heart failure) (HCC) 05/15/2017   OSA (obstructive sleep apnea) 04/28/2017   Depression with anxiety 04/15/2017   GERD (gastroesophageal reflux disease) 09/23/2016   Asthmatic bronchitis , chronic (HCC) 08/27/2015   Mixed hyperlipidemia 08/27/2015   Class 1 obesity due to excess calories with serious comorbidity and body mass index (BMI) of 32.0 to 32.9 in adult 08/27/2015   CAD (coronary artery disease) 05/31/2015   Essential hypertension, benign 02/09/2015     Problem List Items Addressed This Visit       Cardiovascular and Mediastinum   Aneurysm of axillary artery   Axillary pseudoaneurysm infection status postdebridement and completion of 6 weeks of amoxicillin /clavulanate with cultures positive for Enterococcus faecalis and Streptococcus constellatus.  Discussed plan of care for no additional antibiotics at this time as appears the infection has resolved.  Continue care per vascular surgery and follow-up with infectious disease as needed.      Other Visit Diagnoses       Enterococcus faecalis infection    -   Primary     Streptococcus infection            I am having Randy Hebert. Duquette maintain his acetaminophen , amLODipine , Symbicort , citalopram , pantoprazole , Restasis , aspirin  EC, Ascorbic Acid  (VITAMIN C  PO), Cholecalciferol  (VITAMIN D -3 PO), Ferrous Sulfate  (IRON PO), MAGNESIUM  PO, atorvastatin , naproxen , fluconazole , oxyCODONE -acetaminophen , ondansetron , traMADol , ezetimibe , dapagliflozin  propanediol, Trulicity , pregabalin, clopidogrel , furosemide , and nitroGLYCERIN .   Follow-up: As needed   Cathlyn July, MSN, FNP-C Nurse Practitioner [facility] for Infectious Disease [facility] Main number: [phone]

## 2024-06-30 NOTE — Patient Instructions (Signed)
 Nice to see you.  No additional antibiotics needed.   Follow up with ID as needed.   Have a great day and stay safe!

## 2024-06-30 NOTE — Assessment & Plan Note (Signed)
 Axillary pseudoaneurysm infection status postdebridement and completion of 6 weeks of amoxicillin /clavulanate with cultures positive for Enterococcus faecalis and Streptococcus constellatus.  Discussed plan of care for no additional antibiotics at this time as appears the infection has resolved.  Continue care per vascular surgery and follow-up with infectious disease as needed.

## 2024-07-01 ENCOUNTER — Encounter: Payer: Self-pay | Admitting: Internal Medicine

## 2024-07-01 ENCOUNTER — Ambulatory Visit: Attending: Internal Medicine | Admitting: Internal Medicine

## 2024-07-01 VITALS — BP 138/80 | HR 94 | Ht 65.0 in | Wt 176.0 lb

## 2024-07-01 DIAGNOSIS — R55 Syncope and collapse: Secondary | ICD-10-CM | POA: Diagnosis not present

## 2024-07-01 NOTE — Patient Instructions (Signed)
 Medication Instructions:  Your physician recommends that you continue on your current medications as directed. Please refer to the Current Medication list given to you today.   *If you need a refill on your cardiac medications before your next appointment, please call your pharmacy*  Lab Work: NONE   If you have labs (blood work) drawn today and your tests are completely normal, you will receive your results only by: MyChart Message (if you have MyChart) OR A paper copy in the mail If you have any lab test that is abnormal or we need to change your treatment, we will call you to review the results.  Testing/Procedures: NONE   Follow-Up: At [facility], you and your health needs are our priority.  As part of our continuing mission to provide you with exceptional heart care, our providers are all part of one team.  This team includes your primary Cardiologist (physician) and Advanced Practice Providers or APPs (Physician Assistants and Nurse Practitioners) who all work together to provide you with the care you need, when you need it.  Your next appointment:   1 year(s)  Provider:   Danelle Birmingham, MD    We recommend signing up for the patient portal called MyChart.  Sign up information is provided on this After Visit Summary.  MyChart is used to connect with patients for Virtual Visits (Telemedicine).  Patients are able to view lab/test results, encounter notes, upcoming appointments, etc.  Non-urgent messages can be sent to your provider as well.   To learn more about what you can do with MyChart, go to [email or web address].   Other Instructions Thank you for choosing [facility]!

## 2024-07-01 NOTE — Progress Notes (Signed)
 HPI Mr. Dockter returns today for followup. He is a pleasant morbidly obese man with a h/o syncope, s/p ILR insertion. He has had some dizzy spells but was taking too much diuretic. He has stopped drinking and using cocaine. His son overdosed. He admits to some dietary indiscretion though he has lost 10 lbs in the last year. He has not had syncope.  He tripped on a nail a couple of months ago resulting in psuedoanuerysm requiring repair. He feels better and is now wearing a heart monitor. Denies syncope. Allergies  Allergen Reactions   Coreg  [Carvedilol ] Other (See Comments)    Sinus pause on telemetry >3 seconds. Longest one 9 sec. No AV nodal agent   Zestril  [Lisinopril ] Anaphylaxis, Shortness Of Breath and Swelling    Angioedema, required intubation and mechanical ventilation   Chantix  [Varenicline ] Other (See Comments)    Nightmares Insomnia    Amoxil  [Amoxicillin ] Nausea Only     Current Outpatient Medications  Medication Sig Dispense Refill   acetaminophen  (TYLENOL ) 500 MG tablet Take 2 tablets (1,000 mg total) by mouth every 8 (eight) hours as needed for mild pain or fever.     amLODipine  (NORVASC ) 10 MG tablet Take 1 tablet (10 mg total) by mouth daily. 90 tablet 1   Ascorbic Acid  (VITAMIN C  PO) Take 1 tablet by mouth daily.     aspirin  EC 81 MG tablet Take 1 tablet (81 mg total) by mouth daily. Swallow whole. 30 tablet 12   atorvastatin  (LIPITOR ) 80 MG tablet Take 1 tablet (80 mg total) by mouth daily. 30 tablet 2   Cholecalciferol  (VITAMIN D -3 PO) Take 1 capsule by mouth daily.     citalopram  (CELEXA ) 20 MG tablet Take 1 tablet (20 mg total) by mouth daily. 90 tablet 1   clopidogrel  (PLAVIX ) 75 MG tablet Take 1 tablet (75 mg total) by mouth daily. 30 tablet 6   dapagliflozin  propanediol (FARXIGA ) 10 MG TABS tablet Take 1 tablet (10 mg total) by mouth daily. 90 tablet 0   Dulaglutide  (TRULICITY ) 1.5 MG/0.5ML SOAJ Inject 1.5 mg into the skin once a week. 6 mL 0   ezetimibe   (ZETIA ) 10 MG tablet TAKE ONE TABLET BY MOUTH DAILY 90 tablet 1   Ferrous Sulfate  (IRON PO) Take 1 tablet by mouth daily.     furosemide  (LASIX ) 40 MG tablet Take 1 tablet (40 mg total) by mouth daily as needed (excessive swelling). 30 tablet 6   MAGNESIUM  PO Take 1 tablet by mouth 2 (two) times daily.     nitroGLYCERIN  (NITROSTAT ) 0.4 MG SL tablet Place 1 tablet (0.4 mg total) under the tongue every 5 (five) minutes x 3 doses as needed (if no relief after 3rd dose, proceed to ED or call 911). 25 tablet 3   pantoprazole  (PROTONIX ) 40 MG tablet TAKE (1) TABLET BY MOUTH TWICE DAILY BEFORE A MEAL. 180 tablet 1   pregabalin (LYRICA) 75 MG capsule Take 75 mg by mouth daily.     RESTASIS  0.05 % ophthalmic emulsion Place 1 drop into both eyes 2 (two) times daily.     SYMBICORT  160-4.5 MCG/ACT inhaler INHALE 2 PUFFS INTO THE LUNGS FIRST THING IN THE MORNING AND 2 PUFFS ABOUT 12 HOURS LATER. 10.2 g 12   No current facility-administered medications for this visit.     Past Medical History:  Diagnosis Date   ACE inhibitor-aggravated angioedema    Allergy     Anemia    Angio-edema    Anxiety  Arthritis    Asthma    hip replacement   Back pain    Bradycardia 04/28/2017   Bulging of cervical intervertebral disc    CAD (coronary artery disease)    lateral STEMI 02/06/2015 00% D1 occlusion treated with Promus Premier 2.5 mm x 16 mm DES, 70% ramus stenosis, 40% mid RCA stenosis, 45% distal RCA stenosis, EF 45-50%   CHF (congestive heart failure) (HCC)    COPD (chronic obstructive pulmonary disease) (HCC)    Depression    Diabetes mellitus without complication (HCC)    Difficult intubation    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   Dry eye    Dyspnea    Early satiety 09/23/2016   Fatty liver    GERD (gastroesophageal reflux disease)    HCAP (healthcare-associated pneumonia) 05/15/2017   Headache    Heart murmur    Hip pain    Hyperlipidemia    Hypertension    Lobar pneumonia  05/15/2017   Melena 08/04/2018   MI (myocardial infarction) [facility])    Myocardial infarction (HCC)    Neck pain    Non-ST elevation (NSTEMI) myocardial infarction (HCC) 04/27/2017   NSTEMI (non-ST elevated myocardial infarction) (HCC) 04/26/2017   Otitis media    Pleurisy    Pneumonia due to COVID-19 virus 10/07/2019   Rectal bleeding 11/08/2018   Right shoulder pain 03/20/2020   Sinus pause    9 sec sinus pause on telemetry after started on coreg  after MI, avoid AV nodal blocking agent   Sleep apnea    uses a cpap   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   Past Surgical History:  Procedure Laterality Date   ARTERY EXPLORATION Left 03/24/2024   Procedure: LEFT ARCH AORTOGRAM, LEFT UPPER EXTREMITY ANGIOGRAM WITH FIRST ORDER CANNULATION, LEFT AXILLARY ANGIOPLASTY AND STENTING, EVACUATION OF LEFT AXILLARY HEMATOMA;  Surgeon: Magda Debby SAILOR, MD;  Location: [facility] OR;  Service: Vascular;  Laterality: Left;   ARTERY REPAIR Left 03/11/2024   Procedure: REPAIR LEFT AXILLARY ARTERY;  Surgeon: Serene Gaile ORN, MD;  Location: [facility] OR;  Service: Vascular;  Laterality: Left;  ARTERIAL REPAIR, LEFT AXILLARY   BIOPSY  10/09/2016   Procedure: BIOPSY;  Surgeon: Lamar CHRISTELLA Hollingshead, MD;  Location: [facility] ENDO SUITE;  Service: Endoscopy;;   BIOPSY  11/28/2019   Procedure: BIOPSY;  Surgeon: Hollingshead Lamar CHRISTELLA, MD;  Location: [facility];  Service: Endoscopy;;   BIOPSY  06/15/2023   Procedure: BIOPSY;  Surgeon: Hollingshead Lamar CHRISTELLA, MD;  Location: [facility];  Service: Endoscopy;;   BRONCHIAL BIOPSY  10/16/2022   Procedure: BRONCHIAL BIOPSIES;  Surgeon: Gladis Leonor CHRISTELLA, MD;  Location: [facility]  ENDOSCOPY;  Service: Pulmonary;;   BRONCHIAL NEEDLE ASPIRATION BIOPSY  10/16/2022   Procedure: BRONCHIAL NEEDLE ASPIRATION BIOPSIES;  Surgeon: Gladis Leonor CHRISTELLA, MD;  Location: [facility] ENDOSCOPY;  Service: Pulmonary;;   BRONCHIAL WASHINGS  10/16/2022   Procedure: BRONCHIAL WASHINGS;  Surgeon: Gladis Leonor CHRISTELLA, MD;  Location: [facility] ENDOSCOPY;  Service: Pulmonary;;   CARDIAC CATHETERIZATION N/A 02/06/2015   Procedure: Left Heart Cath and Coronary Angiography;  Surgeon: Alm ORN Clay, MD;  Location: [facility] INVASIVE CV LAB;  Service: Cardiovascular;  Laterality: N/A;   CARDIAC CATHETERIZATION N/A 02/06/2015   Procedure: Coronary Stent Intervention;  Surgeon: Alm LELON Clay, MD;  Location: [facility] INVASIVE CV LAB;  Service: Cardiovascular;  Laterality: N/A;   COLONOSCOPY WITH PROPOFOL  N/A 10/09/2016   Sigmoid and descending colon diverticulosis, four 4-6 mm polyps in sigmoid, one 4 mm polyp in descending. Tubular adenomas and hyperplastic. 5 year surveillance.    COLONOSCOPY WITH PROPOFOL  N/A 11/24/2016   Sigmoid and descending colon diverticulosis, four 4-6 mm polyps in sigmoid, one 4 mm polyp in descending. Tubular adenomas and hyperplastic. 5 year surveillance.    COLONOSCOPY WITH PROPOFOL  N/A 10/18/2021   Carver: nonbleeding internal hemorrhoids, small and large mouth diverticula found in the sigmoid, descending, transverse colon.  A 9 mm sessile polyp was found in the ascending colon that was removed.  4 sessile polyps found in the sigmoid, descending, transverse colon 3 to 5 mm in size, examining otherwise.  Path revealed tubular adenomas.  Repeat due in 5 years for surveillance.   CORONARY ANGIOPLASTY WITH STENT PLACEMENT  01/2015   CORONARY STENT INTERVENTION N/A 04/27/2017   Procedure: CORONARY STENT INTERVENTION;  Surgeon: Mady Bruckner, MD;  Location: [facility] INVASIVE CV LAB;  Service: Cardiovascular;  Laterality: N/A;   ELECTROPHYSIOLOGY STUDY N/A 06/29/2017   Procedure: ELECTROPHYSIOLOGY STUDY;  Surgeon:  Waddell Danelle LELON, MD;  Location: [facility] INVASIVE CV LAB;  Service: Cardiovascular;  Laterality: N/A;   ESOPHAGOGASTRODUODENOSCOPY (EGD) WITH PROPOFOL  N/A 10/09/2016   Dr. Shaaron: LA grade a esophagitis.  Barrett's esophagus, biopsy-proven.  Small hiatal hernia.  EGD February 2019   ESOPHAGOGASTRODUODENOSCOPY (EGD) WITH PROPOFOL  N/A 11/28/2019    salmon-colored esophageal mucosa (Barrett's) small hiatal hernia, portal hypertensive gastropathy, normal duodenum, 3 year surveillance   ESOPHAGOGASTRODUODENOSCOPY (EGD) WITH PROPOFOL  N/A 09/06/2021   three columns of grade 1 varices in distal esophagus, no stigmata of bleeding or red wale signs. Small hiatal hernia. Mild portal gastropathy. Normal duodenum. Repeat upper endoscopy in 1 year for surveillance.   ESOPHAGOGASTRODUODENOSCOPY (EGD) WITH PROPOFOL  N/A 06/15/2023   Procedure: ESOPHAGOGASTRODUODENOSCOPY (EGD) WITH PROPOFOL ;  Surgeon: Shaaron Lamar HERO, MD;  [facility];  Service: Endoscopy;  Laterality: N/A;  830am, asa 3   FIDUCIAL MARKER PLACEMENT  10/16/2022   Procedure: FIDUCIAL MARKER PLACEMENT;  Surgeon: Gladis Leonor HERO, MD;  Location: [facility] ENDOSCOPY;  Service: Pulmonary;;   INCISION / DRAINAGE HAND / FINGER     LEFT HEART CATH AND CORONARY ANGIOGRAPHY N/A 04/27/2017   Procedure: LEFT HEART CATH AND CORONARY ANGIOGRAPHY;  Surgeon: Mady Bruckner, MD;  Location: [facility] INVASIVE CV LAB;  Service: Cardiovascular;  Laterality: N/A;   LOOP RECORDER INSERTION  06/29/2017   Procedure: Loop Recorder Insertion;  Surgeon: Waddell Danelle LELON, MD;  Location: [facility] INVASIVE CV LAB;  Service: Cardiovascular;;   POLYPECTOMY  11/24/2016   Procedure: POLYPECTOMY;  Surgeon: Lamar HERO Shaaron, MD;  [facility];  Service: Endoscopy;;  descending and sigmoid   POLYPECTOMY  10/18/2021   Procedure: POLYPECTOMY;  Surgeon: Cindie Carlin POUR, DO;  Location: [facility];  Service: Endoscopy;;   TOTAL HIP ARTHROPLASTY Right 07/01/2016   TOTAL HIP ARTHROPLASTY  Right 07/01/2016   Procedure: RIGHT TOTAL HIP ARTHROPLASTY ANTERIOR APPROACH;  Surgeon: Bruckner CINDERELLA Poli, MD;  Location: [facility] OR;  Service: Orthopedics;  Laterality: Right;   ULTRASOUND GUIDANCE FOR VASCULAR ACCESS Left 03/24/2024   Procedure: ULTRASOUND GUIDANCE, FOR VASCULAR ACCESS;  Surgeon: Magda Debby SAILOR, MD;  Location: [facility] OR;  Service: Vascular;  Laterality: Left;  Family History  Problem Relation Age of Onset   Heart attack Father    Stroke Father    Arthritis Father    Heart disease Father    Cancer Mother        ???   Arthritis Mother    Heart disease Brother 67       died in sleep   Early death Brother    Diabetes Maternal Uncle    Alzheimer's disease Maternal Grandmother      Social History   Socioeconomic History   Marital status: Divorced    Spouse name: alice   Number of children: 3   Years of education: 12   Highest education level: Not on file  Occupational History   Occupation: SSI  Tobacco Use   Smoking status: Every Day    Current packs/day: 1.50    Average packs/day: 1.5 packs/day for 46.0 years (69.0 ttl pk-yrs)    Types: Cigarettes    Passive exposure: Never   Smokeless tobacco: Former    Types: Chew   Tobacco comments:    Smokes 1 pack a day of cigarettes (20 cig)10/16/23  Vaping Use   Vaping status: Former  Substance and Sexual Activity   Alcohol  use: Yes    Comment: rare beer use   Drug use: No    Comment: history of drug use- marijuana, cocaine- quit 2014   Sexual activity: Yes    Partners: Female    Birth control/protection: None  Other Topics Concern   Not on file  Social History Narrative   Lives with girlfriend [name]   3 children, but 1 OD  In 2019.   Grandchildren-2      Two cats: may and princess; and a dog-foxy      Enjoy: spending time with pets, TV, yard work       Diet: eats all food groups   Caffeine: half and half coffee, some tea-half and half decaf   Water : 6-8 cups daily      Wears seat belt   Does  not use phone while driving   Smoke detectors at home      Social Drivers of Health   Financial Resource Strain: Low Risk  (01/08/2024)   Overall Financial Resource Strain (CARDIA)    Difficulty of Paying Living Expenses: Not hard at all  Food Insecurity: No Food Insecurity (03/24/2024)   Hunger Vital Sign    Worried About Running Out of Food in the Last Year: Never true    Ran Out of Food in the Last Year: Never true  Transportation Needs: No Transportation Needs (03/24/2024)   PRAPARE - Administrator, Civil Service (Medical): No    Lack of Transportation (Non-Medical): No  Physical Activity: Insufficiently Active (01/08/2024)   Exercise Vital Sign    Days of Exercise per Week: 2 days    Minutes of Exercise per Session: 60 min  Stress: No Stress Concern Present (01/08/2024)   [employer] of Occupational Health - Occupational Stress Questionnaire    Feeling of Stress : Not at all  Social Connections: Moderately Integrated (03/02/2024)   Social Connection and Isolation Panel    Frequency of Communication with Friends and Family: More than three times a week    Frequency of Social Gatherings with Friends and Family: Once a week    Attends Religious Services: More than 4 times per year    Active Member of [employer] or Organizations: Yes    Attends Banker Meetings: More  than 4 times per year    Marital Status: Divorced  Intimate Partner Violence: Not At Risk (03/24/2024)   Humiliation, Afraid, Rape, and Kick questionnaire    Fear of Current or Ex-Partner: No    Emotionally Abused: No    Physically Abused: No    Sexually Abused: No     BP 138/80   Pulse 94   Ht 5' 5 (1.651 m)   Wt 176 lb (79.8 kg)   SpO2 96%   BMI 29.29 kg/m   Physical Exam:  Well appearing NAD HEENT: Unremarkable Neck:  No JVD, no thyromegally Lymphatics:  No adenopathy Back:  No CVA tenderness Lungs:  Clear HEART:  Regular rate rhythm, no murmurs, no rubs, no clicks Abd:  soft,  positive bowel sounds, no organomegally, no rebound, no guarding Ext:  2 plus pulses, no edema, no cyanosis, no clubbing Skin:  No rashes no nodules Neuro:  CN II through XII intact, motor grossly intact   Assess/Plan:Syncope - he is s/p ILR and has done well with no arrhythmias. He has gone over 12 months without an episode. He is not interested in having his ILR removed. 2. Obesity - he has lost some weight. 3. HTN - his bp is back to normal with weight loss and medical therapy. 4. CAD - he is very active and denies anginal symptoms.  5/ Fall - he is doing better. He will undergo watchful waiting.    Danelle Adan Beal,MD

## 2024-07-04 ENCOUNTER — Encounter: Payer: Self-pay | Admitting: Radiology

## 2024-07-04 ENCOUNTER — Other Ambulatory Visit: Payer: Self-pay | Admitting: Family Medicine

## 2024-07-04 ENCOUNTER — Other Ambulatory Visit: Payer: Self-pay | Admitting: Cardiology

## 2024-07-06 ENCOUNTER — Encounter: Payer: Self-pay | Admitting: Physical Therapy

## 2024-07-06 ENCOUNTER — Ambulatory Visit: Attending: Family Medicine | Admitting: Physical Therapy

## 2024-07-06 DIAGNOSIS — M6281 Muscle weakness (generalized): Secondary | ICD-10-CM | POA: Diagnosis present

## 2024-07-06 DIAGNOSIS — M25512 Pain in left shoulder: Secondary | ICD-10-CM | POA: Diagnosis present

## 2024-07-06 NOTE — Therapy (Signed)
 OUTPATIENT PHYSICAL THERAPY TREATMENT   Patient Name: Randy Hebert MRN: [id number] DOB:01-03-1966, 58 y.o., male Today's Date: 07/06/2024  END OF SESSION:  PT End of Session - 07/06/24 1105     Visit Number 12    Number of Visits 20    Date for Recertification  06/23/24    Progress Note Due on Visit 10    PT Start Time 1043    PT Stop Time 1138    PT Time Calculation (min) 55 min    Activity Tolerance Patient tolerated treatment well    Behavior During Therapy [facility] for tasks assessed/performed             Past Medical History:  Diagnosis Date   ACE inhibitor-aggravated angioedema    Allergy     Anemia    Angio-edema    Anxiety    Arthritis    Asthma    hip replacement   Back pain    Bradycardia 04/28/2017   Bulging of cervical intervertebral disc    CAD (coronary artery disease)    lateral STEMI 02/06/2015 00% D1 occlusion treated with Promus Premier 2.5 mm x 16 mm DES, 70% ramus stenosis, 40% mid RCA stenosis, 45% distal RCA stenosis, EF 45-50%   CHF (congestive heart failure) (HCC)    COPD (chronic obstructive pulmonary disease) (HCC)    Depression    Diabetes mellitus without complication (HCC)    Difficult intubation    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   Dry eye    Dyspnea    Early satiety 09/23/2016   Fatty liver    GERD (gastroesophageal reflux disease)    HCAP (healthcare-associated pneumonia) 05/15/2017   Headache    Heart murmur    Hip pain    Hyperlipidemia    Hypertension    Lobar pneumonia 05/15/2017   Melena 08/04/2018   MI (myocardial infarction) [facility])    Myocardial infarction (HCC)    Neck pain    Non-ST elevation (NSTEMI) myocardial infarction (HCC) 04/27/2017   NSTEMI (non-ST elevated myocardial infarction) (HCC) 04/26/2017   Otitis media    Pleurisy    Pneumonia due to COVID-19 virus 10/07/2019   Rectal bleeding 11/08/2018   Right shoulder pain 03/20/2020   Sinus pause    9 sec sinus pause on telemetry after  started on coreg  after MI, avoid AV nodal blocking agent   Sleep apnea    uses a cpap   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   STEMI (ST elevation myocardial infarction) (HCC) 05/31/2015   Substance abuse (HCC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   Syncope 06/29/2017   Syncope and collapse 05/15/2017   Transaminitis 08/04/2018   Unilateral primary osteoarthritis, right hip 07/01/2016   Past Surgical History:  Procedure Laterality Date   ARTERY EXPLORATION Left 03/24/2024   Procedure: LEFT ARCH AORTOGRAM, LEFT UPPER EXTREMITY ANGIOGRAM WITH FIRST ORDER CANNULATION, LEFT AXILLARY ANGIOPLASTY AND STENTING, EVACUATION OF LEFT AXILLARY HEMATOMA;  Surgeon: Magda Debby SAILOR, MD;  Location: [facility] OR;  Service: Vascular;  Laterality: Left;   ARTERY REPAIR Left 03/11/2024   Procedure: REPAIR LEFT AXILLARY ARTERY;  Surgeon: Serene Gaile ORN, MD;  Location: [facility] OR;  Service: Vascular;  Laterality: Left;  ARTERIAL REPAIR, LEFT AXILLARY   BIOPSY  10/09/2016   Procedure: BIOPSY;  Surgeon: Lamar CHRISTELLA Hollingshead, MD;  Location: [facility] ENDO SUITE;  Service: Endoscopy;;   BIOPSY  11/28/2019   Procedure: BIOPSY;  Surgeon: Hollingshead Lamar CHRISTELLA, MD;  Location: [facility] END[facility]ITE;  Service: Endoscopy;;   BIOPSY  06/15/2023   Procedure: BIOPSY;  Surgeon: Shaaron Lamar HERO, MD;  Location: [facility] END[facility];  Service: Endoscopy;;   BRONCHIAL BIOPSY  10/16/2022   Procedure: BRONCHIAL BIOPSIES;  Surgeon: Gladis Leonor HERO, MD;  Location: [facility] ENDOSCOPY;  Service: Pulmonary;;   BRONCHIAL NEEDLE ASPIRATION BIOPSY  10/16/2022   Procedure: BRONCHIAL NEEDLE ASPIRATION BIOPSIES;  Surgeon: Gladis Leonor HERO, MD;  Location: [facility] ENDOSCOPY;  Service: Pulmonary;;   BRONCHIAL WASHINGS  10/16/2022   Procedure: BRONCHIAL WASHINGS;  Surgeon: Gladis Leonor HERO, MD;  Location: [facility] ENDOSCOPY;  Service: Pulmonary;;   CARDIAC CATHETERIZATION N/A 02/06/2015   Procedure: Left Heart Cath and Coronary Angiography;  Surgeon: Alm LELON Clay, MD;  Location: [facility] INVASIVE CV LAB;   Service: Cardiovascular;  Laterality: N/A;   CARDIAC CATHETERIZATION N/A 02/06/2015   Procedure: Coronary Stent Intervention;  Surgeon: Alm LELON Clay, MD;  Location: [facility] INVASIVE CV LAB;  Service: Cardiovascular;  Laterality: N/A;   COLONOSCOPY WITH PROPOFOL  N/A 10/09/2016   Sigmoid and descending colon diverticulosis, four 4-6 mm polyps in sigmoid, one 4 mm polyp in descending. Tubular adenomas and hyperplastic. 5 year surveillance.    COLONOSCOPY WITH PROPOFOL  N/A 11/24/2016   Sigmoid and descending colon diverticulosis, four 4-6 mm polyps in sigmoid, one 4 mm polyp in descending. Tubular adenomas and hyperplastic. 5 year surveillance.    COLONOSCOPY WITH PROPOFOL  N/A 10/18/2021   Carver: nonbleeding internal hemorrhoids, small and large mouth diverticula found in the sigmoid, descending, transverse colon.  A 9 mm sessile polyp was found in the ascending colon that was removed.  4 sessile polyps found in the sigmoid, descending, transverse colon 3 to 5 mm in size, examining otherwise.  Path revealed tubular adenomas.  Repeat due in 5 years for surveillance.   CORONARY ANGIOPLASTY WITH STENT PLACEMENT  01/2015   CORONARY STENT INTERVENTION N/A 04/27/2017   Procedure: CORONARY STENT INTERVENTION;  Surgeon: Mady Bruckner, MD;  Location: [facility] INVASIVE CV LAB;  Service: Cardiovascular;  Laterality: N/A;   ELECTROPHYSIOLOGY STUDY N/A 06/29/2017   Procedure: ELECTROPHYSIOLOGY STUDY;  Surgeon: Waddell Danelle LELON, MD;  Location: [facility] INVASIVE CV LAB;  Service: Cardiovascular;  Laterality: N/A;   ESOPHAGOGASTRODUODENOSCOPY (EGD) WITH PROPOFOL  N/A 10/09/2016   Dr. Shaaron: LA grade a esophagitis.  Barrett's esophagus, biopsy-proven.  Small hiatal hernia.  EGD February 2019   ESOPHAGOGASTRODUODENOSCOPY (EGD) WITH PROPOFOL  N/A 11/28/2019    salmon-colored esophageal mucosa (Barrett's) small hiatal hernia, portal hypertensive gastropathy, normal duodenum, 3 year surveillance   ESOPHAGOGASTRODUODENOSCOPY (EGD) WITH  PROPOFOL  N/A 09/06/2021   three columns of grade 1 varices in distal esophagus, no stigmata of bleeding or red wale signs. Small hiatal hernia. Mild portal gastropathy. Normal duodenum. Repeat upper endoscopy in 1 year for surveillance.   ESOPHAGOGASTRODUODENOSCOPY (EGD) WITH PROPOFOL  N/A 06/15/2023   Procedure: ESOPHAGOGASTRODUODENOSCOPY (EGD) WITH PROPOFOL ;  Surgeon: Shaaron Lamar HERO, MD;  [facility];  Service: Endoscopy;  Laterality: N/A;  830am, asa 3   FIDUCIAL MARKER PLACEMENT  10/16/2022   Procedure: FIDUCIAL MARKER PLACEMENT;  Surgeon: Gladis Leonor HERO, MD;  Location: [facility] ENDOSCOPY;  Service: Pulmonary;;   INCISION / DRAINAGE HAND / FINGER     LEFT HEART CATH AND CORONARY ANGIOGRAPHY N/A 04/27/2017   Procedure: LEFT HEART CATH AND CORONARY ANGIOGRAPHY;  Surgeon: Mady Bruckner, MD;  Location: [facility] INVASIVE CV LAB;  Service: Cardiovascular;  Laterality: N/A;   LOOP RECORDER INSERTION  06/29/2017   Procedure: Loop Recorder Insertion;  Surgeon: Waddell Danelle LELON, MD;  Location:  [facility] INVASIVE CV LAB;  Service: Cardiovascular;;   POLYPECTOMY  11/24/2016   Procedure: POLYPECTOMY;  Surgeon: Lamar CHRISTELLA Hollingshead, MD;  [facility];  Service: Endoscopy;;  descending and sigmoid   POLYPECTOMY  10/18/2021   Procedure: POLYPECTOMY;  Surgeon: Cindie Carlin POUR, DO;  Location: [facility];  Service: Endoscopy;;   TOTAL HIP ARTHROPLASTY Right 07/01/2016   TOTAL HIP ARTHROPLASTY Right 07/01/2016   Procedure: RIGHT TOTAL HIP ARTHROPLASTY ANTERIOR APPROACH;  Surgeon: Lonni CINDERELLA Poli, MD;  Location: [facility] OR;  Service: Orthopedics;  Laterality: Right;   ULTRASOUND GUIDANCE FOR VASCULAR ACCESS Left 03/24/2024   Procedure: ULTRASOUND GUIDANCE, FOR VASCULAR ACCESS;  Surgeon: Magda Debby SAILOR, MD;  Location: [facility] OR;  Service: Vascular;  Laterality: Left;   Patient Active Problem List   Diagnosis Date Noted   Pain of left upper extremity 04/24/2024   Acute postoperative anemia due to greater  than expected blood loss 03/26/2024   Obesity, class 1 03/26/2024   Aneurysm of axillary artery 03/24/2024   Axillary artery injury 03/12/2024   Acute CVA (cerebrovascular accident) (HCC) 03/02/2024   Essential hypertension 03/02/2024   Dyslipidemia 03/02/2024   Type 2 diabetes mellitus with hyperlipidemia (HCC) 03/02/2024   Erectile dysfunction 02/24/2023   Chronic eczematous otitis externa of both ears 02/05/2023   Tobacco abuse 12/25/2022   Malignant neoplasm of upper lobe of right lung (HCC) 09/25/2022   Cervical spondylosis with myelopathy 08/01/2022   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   DDD (degenerative disc disease), cervical 05/21/2022   Osteoarthritis of both hands 05/19/2022   History of GI bleed 11/13/2021   Insomnia 09/13/2021   Cirrhosis of liver (HCC) 11/08/2018   Barrett's esophagus 08/04/2018   Vitamin D  deficiency 06/30/2018   Alcohol  abuse 06/30/2018   DM type 2 causing vascular disease (HCC) 08/17/2017   Chronic combined systolic and diastolic CHF (congestive heart failure) (HCC) 05/15/2017   OSA (obstructive sleep apnea) 04/28/2017   Depression with anxiety 04/15/2017   GERD (gastroesophageal reflux disease) 09/23/2016   Asthmatic bronchitis , chronic (HCC) 08/27/2015   Mixed hyperlipidemia 08/27/2015   Class 1 obesity due to excess calories with serious comorbidity and body mass index (BMI) of 32.0 to 32.9 in adult 08/27/2015   CAD (coronary artery disease) 05/31/2015   Essential hypertension, benign 02/09/2015    PCP: Cook, Jayce G, DO   REFERRING PROVIDER: Noralee Elidia Sieving*   REFERRING DIAG: left axillary pseudoaneurysm   Rationale for Evaluation and Treatment:  Rehabiliation  THERAPY DIAG:  Muscle weakness (generalized)  Acute pain of left shoulder  ONSET DATE: 03/11/24   SUBJECTIVE:  SUBJECTIVE STATEMENT: Pt states he saw Dr. Lucillie for follow up, says he did not get a lot of information from this visit. Pain is staying about 5/10    PERTINENT HISTORY:  past medical history of diabetes mellitus type 2, hypertension, CAD, liver cirrhosis, anxiety, depression, OSA, CVA w/ left-sided weakness who had a recent hospitalization 7/11 to 03/19/24 post traumatic left shoulder dislocation, complicated with left axillary hematoma with left axillary pseudoaneurysm. On 07/12 he underwent open exposure and repair of the left axillary pseudoaneurysm and evacuation of left axillary hematoma.   PAIN:  NPRS scale: 5/10 Pain location:left shoulder/axillary, left forearm and wrist Pain description: constant, achy, sharp Aggravating factors: any use of this left arm. Relieving factors: rest, meds   PRECAUTIONS: ,  Shoulder  RED FLAGS: None   WEIGHT BEARING RESTRICTIONS:  Not anymore  FALLS:  Has patient fallen in last 6 months? Yes. Number of falls 1   OCCUPATION:  disabled  PLOF:  Independent with basic ADLs  PATIENT GOALS:  Reduce pain and get his arm back to normal  OBJECTIVE:  Note: Objective measures were completed at Evaluation unless otherwise noted.  DIAGNOSTIC FINDINGS:  Chest CT IMPRESSION: 1. Enlarging left axillary hematoma with arterial contrast extravasation from the posterior wall of the left axillary artery into a 1.5 x 1.8 cm pseudoaneurysm, with additional 1.8 x 1.1 cm pocket of contrast more posteriorly within the collection. 2. Increased heterogeneous hematoma in the left subscapularis region measuring 7.3 x 4.8 cm, but no focal contrast is seen within this. 3. Emphysema. 4. Right apical fiducial markers and post XRT changes, with treated nodule just above the fiducial markers today measuring 8 mm, previously 9 mm. 5. Aortic and coronary artery atherosclerosis.  Right aortic arch. 6. Enlarged pulmonary trunk 3.3 cm  indicating arterial hypertension. 7. Cirrhosis and splenomegaly.  Prominent patent portal vein 1.7 cm. 8. Critical Value/emergent results were called by telephone at the time of interpretation on 03/24/2024 at 5:18 am to provider [facility] , who verbally acknowledged these results.  Shoulder XR IMPRESSION: No acute bony abnormality.   Postoperative changes in the left axilla.  No imaging for left elbow/wrist  06/03/24 EMG & NCS Examination  Impression: 1. This is an abnormal electrodiagnostic study of the left upper extremity 2. There is electrodiagnostic evidence of a diffuse, subacute brachial plexopathy affecting the upper, middle, and lower trunks of the left upper extremity with widespread denervation changes indicating ongoing axonal injury   PATIENT SURVEYS:  Patient-Specific Activity Scoring Scheme  0 represents "unable to perform." 10 represents "able to perform at prior level. 0 1 2 3 4 5 6 7 8 9  10 (Date and Score)   Activity Eval  06/21/24  1. Raising left arm  0 0   2. Putting on and off shirt 1  8  3.     4.    5.    Score 0.5/10 4/10   Total score = sum of the activity scores/number of activities Minimum detectable change (90%CI) for average score = 2 points Minimum detectable change (90%CI) for single activity score = 3 points     EDEMA:  Yes: moderate edema noted in left upper extremity from shoulder to hand  Observation:  2 incisions noted, one at proximal chest, one at axilla, both incisions closed with stitches and appear closed without any drainage or signs of infection at the moment. Dressing was removed and changed for him at eval.     UPPER EXTREMITY ROM:  AROM/Passive ROM Left eval Left 04/13/24 Left 05/24/24 Left 06/21/24  Shoulder flexion /50 /120 30/150 40/  Shoulder extension      Shoulder abduction /50 /100 20/140 22/  Shoulder adduction      Shoulder extension      Shoulder internal rotation /50     Shoulder external  rotation /40 /70    Elbow flexion      Elbow extension      Wrist flexion      Wrist extension      Wrist ulnar deviation      Wrist radial deviation      Wrist pronation      Wrist supination       (Blank rows = not tested)   UPPER EXTREMITY MMT:  MMT Left eval Left 05/24/24 Left 06/21/24  Shoulder flexion 1 2 2   Shoulder extension     Shoulder abduction 1 2 2   Shoulder adduction     Shoulder extension     Shoulder internal rotation 1 2 4   Shoulder external rotation 1 2 3   Middle trapezius     Lower trapezius     Elbow flexion 1 3 4   Elbow extension 2 3 4   Wrist flexion     Wrist extension     Wrist ulnar deviation     Wrist radial deviation     Wrist pronation     Wrist supination     Grip strength      (Blank rows = not tested)                                                                                                                              TREATMENT DATE:  07/06/24 Theractivity UBE L1 X 10 min with left hand wrapped to handle with ace wrap to maintain grip Standing p ball roll outs on mat table for AAROM flexion, abduction, circles CW & CCW X 15 reps each for reaching Gripping ace bandage X 25 Standing chest press AROM 2X10 Standing scapular retractions X10 with red band wrapped around hand Standing bicep curls with red band around hand Blaze pods 3pods, using UE to turn off working on reaching horizontal abduction/adduction and hand control, pods placed on high mat, performed 30 sec X 3 with 30 sec rest in between Blaze pods 3 pods, using UE to turn off working on reaching flexion/extension and hand control, pods placed on high mat, performed 30 sec X 3 with 30 sec rest in between Wall ladder X 8 reps  Self care: education about his NCV test results, prognosis, healing times, treatment options. Printed out NVC impression for him.   06/27/24 Theractivity UBE L1 X 10 min with left hand wrapped to handle with ace wrap to maintain grip Standing p ball  roll outs on mat table for AAROM flexion, abduction, circles CW & CCW X 15 reps each for reaching Gripping X 20 Standing chest press AROM 2X10 Standing scapular retractions  2X10 Blaze pods 3pods, using UE to turn off working on reaching horizontal abduction/adduction and hand control, pods placed on high mat, performed 30 sec X 3 with 30 sec rest in between Blaze pods 3 pods, using UE to turn off working on reaching flexion/extension and hand control, pods placed on high mat, performed 30 sec X 3 with 30 sec rest in between  Therex: Supine shoulder AAROM hands clasped X 10 reps Supine shoulder flexion eccentrics with PT assist x15 Supine chest press AROM X 2X10 Supine shoulder ER against manual PT resistance X 10 Supine shoulder IR against manual PT resistance X 10 Supine shoulder abduction AROM 2 X 10 reps      PATIENT EDUCATION: Education details: HEP, PT plan of care, selfcare Person educated: Patient Education method: Explanation, Demonstration, Verbal cues, and Handouts Education comprehension: verbalized understanding, further education recommended   HOME EXERCISE PROGRAM: Access Code: [email or web address] URL: [email or web address] Date: 03/31/2024 Prepared by: Redell Moose  Exercises - Circular Shoulder Pendulum with Table Support  - 2-3 x daily - 7 x weekly - 1 sets - 15 reps - Seated Shoulder Abduction Towel Slide at Table Top  - 2-3 x daily - 6 x weekly - 1 sets - 10 reps - 5 sec hold - Seated Shoulder Flexion Towel Slide at Table Top  - 2-3 x daily - 6 x weekly - 1 sets - 10 reps - 5 sec hold - Seated Shoulder External Rotation PROM on Table  - 2-3 x daily - 6 x weekly - 1 sets - 10 reps - 5 sec hold - Seated Gripping Towel  - 2-3 x daily - 6 x weekly - 1 sets - 20 reps - Standing Elbow Flexion Extension AROM  - 2-3 x daily - 6 x weekly - 1 sets - 10 reps  ASSESSMENT:  CLINICAL IMPRESSION: NCV results found from atrium showing Impression: 1. This is an  abnormal electrodiagnostic study of the left upper extremity 2. There is electrodiagnostic evidence of a diffuse, subacute brachial plexopathy affecting the upper, middle, and lower trunks of the left upper extremity with widespread denervation changes indicating ongoing axonal injury  This would explain his slow overall progress with his shoulder and hand. PT continued to work to improve strength and function of left UE.   OBJECTIVE IMPAIRMENTS: decreased activity tolerance for ADL's, difficulty walking, decreased balance, decreased endurance, decreased mobility, decreased ROM, decreased strength, impaired flexibility, impaired UE use, and pain.  ACTIVITY LIMITATIONS: bending, lifting, carry, reaching, locomotion, cleaning, community activity, driving,  PERSONAL FACTORS: see above PMH  also affecting patient's functional outcome.  REHAB POTENTIAL: Good  CLINICAL DECISION MAKING: Evolving/moderate complexity  EVALUATION COMPLEXITY: Moderate    GOALS: Short term PT Goals Target date: 04/28/2024   Pt will be I and compliant with HEP. Baseline:  Goal status: MET 04/18/24 Pt will decrease pain by 25% overall Baseline: Goal status: MET 04/18/24  Long term PT goals Target date:06/23/2024  Pt will improve right shoulder and elbow AROM to [facility] to improve functional mobility Baseline: Goal status: ongoing 10/21 Pt will improve  right arm strength to at least 4+/5 MMT to improve functional strength Baseline: Goal status: ongoing 10/21 Pt will improve Patient specific functional scale (PSFS) to at least 7/10 to show improved function level Baseline: Goal status: ongoing 10/21 Pt will reduce pain to overall less than 3/10 with usual activity Baseline: Goal status: ongoing 10/21 PLAN: PT FREQUENCY: 1-3 times per week   PT DURATION: 6-8 weeks  PLANNED INTERVENTIONS (  unless contraindicated):  97110-Therapeutic exercises, 97530- Therapeutic activity, [id number]- Neuromuscular re-education,  97535- Self Care, 02859- Manual therapy, [id number]- Electrical stimulation (unattended), 97016- Vasopneumatic device, [id number]- Ultrasound, and 97033- Ionotophoresis 4mg /ml Dexamethasone   PLAN FOR NEXT SESSION: progress strength and function as tolerated  Redell JONELLE Moose, PT,DPT 07/06/2024, 12:03 PM

## 2024-07-12 ENCOUNTER — Encounter: Payer: Self-pay | Admitting: Physical Therapy

## 2024-07-12 ENCOUNTER — Ambulatory Visit: Admitting: Physical Therapy

## 2024-07-12 DIAGNOSIS — M6281 Muscle weakness (generalized): Secondary | ICD-10-CM

## 2024-07-12 DIAGNOSIS — M25512 Pain in left shoulder: Secondary | ICD-10-CM

## 2024-07-12 NOTE — Therapy (Signed)
 OUTPATIENT PHYSICAL THERAPY TREATMENT   Patient Name: Randy Hebert MRN: [id number] DOB:10-06-1965, 58 y.o., male Today's Date: 07/12/2024  END OF SESSION:  PT End of Session - 07/12/24 0856     Visit Number 13    Number of Visits 20    Date for Recertification  06/23/24    Progress Note Due on Visit 20    PT Start Time 0845    PT Stop Time 0925    PT Time Calculation (min) 40 min    Activity Tolerance Patient tolerated treatment well    Behavior During Therapy [facility] for tasks assessed/performed             Past Medical History:  Diagnosis Date   ACE inhibitor-aggravated angioedema    Allergy     Anemia    Angio-edema    Anxiety    Arthritis    Asthma    hip replacement   Back pain    Bradycardia 04/28/2017   Bulging of cervical intervertebral disc    CAD (coronary artery disease)    lateral STEMI 02/06/2015 00% D1 occlusion treated with Promus Premier 2.5 mm x 16 mm DES, 70% ramus stenosis, 40% mid RCA stenosis, 45% distal RCA stenosis, EF 45-50%   CHF (congestive heart failure) (HCC)    COPD (chronic obstructive pulmonary disease) (HCC)    Depression    Diabetes mellitus without complication (HCC)    Difficult intubation    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   Dry eye    Dyspnea    Early satiety 09/23/2016   Fatty liver    GERD (gastroesophageal reflux disease)    HCAP (healthcare-associated pneumonia) 05/15/2017   Headache    Heart murmur    Hip pain    Hyperlipidemia    Hypertension    Lobar pneumonia 05/15/2017   Melena 08/04/2018   MI (myocardial infarction) (HCC)    Myocardial infarction (HCC)    Neck pain    Non-ST elevation (NSTEMI) myocardial infarction (HCC) 04/27/2017   NSTEMI (non-ST elevated myocardial infarction) (HCC) 04/26/2017   Otitis media    Pleurisy    Pneumonia due to COVID-19 virus 10/07/2019   Rectal bleeding 11/08/2018   Right shoulder pain 03/20/2020   Sinus pause    9 sec sinus pause on telemetry after  started on coreg  after MI, avoid AV nodal blocking agent   Sleep apnea    uses a cpap   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   STEMI (ST elevation myocardial infarction) (HCC) 05/31/2015   Substance abuse (HCC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   Syncope 06/29/2017   Syncope and collapse 05/15/2017   Transaminitis 08/04/2018   Unilateral primary osteoarthritis, right hip 07/01/2016   Past Surgical History:  Procedure Laterality Date   ARTERY EXPLORATION Left 03/24/2024   Procedure: LEFT ARCH AORTOGRAM, LEFT UPPER EXTREMITY ANGIOGRAM WITH FIRST ORDER CANNULATION, LEFT AXILLARY ANGIOPLASTY AND STENTING, EVACUATION OF LEFT AXILLARY HEMATOMA;  Surgeon: Magda Debby SAILOR, MD;  Location: [facility] OR;  Service: Vascular;  Laterality: Left;   ARTERY REPAIR Left 03/11/2024   Procedure: REPAIR LEFT AXILLARY ARTERY;  Surgeon: Serene Gaile ORN, MD;  Location: [facility] OR;  Service: Vascular;  Laterality: Left;  ARTERIAL REPAIR, LEFT AXILLARY   BIOPSY  10/09/2016   Procedure: BIOPSY;  Surgeon: Lamar CHRISTELLA Hollingshead, MD;  Location: [facility] ENDO [facility]ITE;  Service: Endoscopy;;   BIOPSY  11/28/2019   Procedure: BIOPSY;  Surgeon: Hollingshead Lamar CHRISTELLA, MD;  Location: [facility];  Service: Endoscopy;;   BIOPSY  06/15/2023   Procedure: BIOPSY;  Surgeon: Shaaron Lamar HERO, MD;  Location: [facility];  Service: Endoscopy;;   BRONCHIAL BIOPSY  10/16/2022   Procedure: BRONCHIAL BIOPSIES;  Surgeon: Gladis Leonor HERO, MD;  Location: [facility] ENDOSCOPY;  Service: Pulmonary;;   BRONCHIAL NEEDLE ASPIRATION BIOPSY  10/16/2022   Procedure: BRONCHIAL NEEDLE ASPIRATION BIOPSIES;  Surgeon: Gladis Leonor HERO, MD;  Location: [facility] ENDOSCOPY;  Service: Pulmonary;;   BRONCHIAL WASHINGS  10/16/2022   Procedure: BRONCHIAL WASHINGS;  Surgeon: Gladis Leonor HERO, MD;  Location: [facility] ENDOSCOPY;  Service: Pulmonary;;   CARDIAC CATHETERIZATION N/A 02/06/2015   Procedure: Left Heart Cath and Coronary Angiography;  Surgeon: Alm LELON Clay, MD;  Location: [facility] INVASIVE CV LAB;   Service: Cardiovascular;  Laterality: N/A;   CARDIAC CATHETERIZATION N/A 02/06/2015   Procedure: Coronary Stent Intervention;  Surgeon: Alm LELON Clay, MD;  Location: [facility] INVASIVE CV LAB;  Service: Cardiovascular;  Laterality: N/A;   COLONOSCOPY WITH PROPOFOL  N/A 10/09/2016   Sigmoid and descending colon diverticulosis, four 4-6 mm polyps in sigmoid, one 4 mm polyp in descending. Tubular adenomas and hyperplastic. 5 year surveillance.    COLONOSCOPY WITH PROPOFOL  N/A 11/24/2016   Sigmoid and descending colon diverticulosis, four 4-6 mm polyps in sigmoid, one 4 mm polyp in descending. Tubular adenomas and hyperplastic. 5 year surveillance.    COLONOSCOPY WITH PROPOFOL  N/A 10/18/2021   Carver: nonbleeding internal hemorrhoids, small and large mouth diverticula found in the sigmoid, descending, transverse colon.  A 9 mm sessile polyp was found in the ascending colon that was removed.  4 sessile polyps found in the sigmoid, descending, transverse colon 3 to 5 mm in size, examining otherwise.  Path revealed tubular adenomas.  Repeat due in 5 years for surveillance.   CORONARY ANGIOPLASTY WITH STENT PLACEMENT  01/2015   CORONARY STENT INTERVENTION N/A 04/27/2017   Procedure: CORONARY STENT INTERVENTION;  Surgeon: Mady Bruckner, MD;  Location: [facility] INVASIVE CV LAB;  Service: Cardiovascular;  Laterality: N/A;   ELECTROPHYSIOLOGY STUDY N/A 06/29/2017   Procedure: ELECTROPHYSIOLOGY STUDY;  Surgeon: Waddell Danelle LELON, MD;  Location: [facility] INVASIVE CV LAB;  Service: Cardiovascular;  Laterality: N/A;   ESOPHAGOGASTRODUODENOSCOPY (EGD) WITH PROPOFOL  N/A 10/09/2016   Dr. Shaaron: LA grade a esophagitis.  Barrett's esophagus, biopsy-proven.  Small hiatal hernia.  EGD February 2019   ESOPHAGOGASTRODUODENOSCOPY (EGD) WITH PROPOFOL  N/A 11/28/2019    salmon-colored esophageal mucosa (Barrett's) small hiatal hernia, portal hypertensive gastropathy, normal duodenum, 3 year surveillance   ESOPHAGOGASTRODUODENOSCOPY (EGD) WITH  PROPOFOL  N/A 09/06/2021   three columns of grade 1 varices in distal esophagus, no stigmata of bleeding or red wale signs. Small hiatal hernia. Mild portal gastropathy. Normal duodenum. Repeat upper endoscopy in 1 year for surveillance.   ESOPHAGOGASTRODUODENOSCOPY (EGD) WITH PROPOFOL  N/A 06/15/2023   Procedure: ESOPHAGOGASTRODUODENOSCOPY (EGD) WITH PROPOFOL ;  Surgeon: Shaaron Lamar HERO, MD;  [facility];  Service: Endoscopy;  Laterality: N/A;  830am, asa 3   FIDUCIAL MARKER PLACEMENT  10/16/2022   Procedure: FIDUCIAL MARKER PLACEMENT;  Surgeon: Gladis Leonor HERO, MD;  Location: [facility] ENDOSCOPY;  Service: Pulmonary;;   INCISION / DRAINAGE HAND / FINGER     LEFT HEART CATH AND CORONARY ANGIOGRAPHY N/A 04/27/2017   Procedure: LEFT HEART CATH AND CORONARY ANGIOGRAPHY;  Surgeon: Mady Bruckner, MD;  Location: [facility] INVASIVE CV LAB;  Service: Cardiovascular;  Laterality: N/A;   LOOP RECORDER INSERTION  06/29/2017   Procedure: Loop Recorder Insertion;  Surgeon: Waddell Danelle LELON, MD;  Location:  [facility] INVASIVE CV LAB;  Service: Cardiovascular;;   POLYPECTOMY  11/24/2016   Procedure: POLYPECTOMY;  Surgeon: Lamar CHRISTELLA Hollingshead, MD;  [facility];  Service: Endoscopy;;  descending and sigmoid   POLYPECTOMY  10/18/2021   Procedure: POLYPECTOMY;  Surgeon: Cindie Carlin POUR, DO;  Location: [facility];  Service: Endoscopy;;   TOTAL HIP ARTHROPLASTY Right 07/01/2016   TOTAL HIP ARTHROPLASTY Right 07/01/2016   Procedure: RIGHT TOTAL HIP ARTHROPLASTY ANTERIOR APPROACH;  Surgeon: Lonni CINDERELLA Poli, MD;  Location: [facility] OR;  Service: Orthopedics;  Laterality: Right;   ULTRASOUND GUIDANCE FOR VASCULAR ACCESS Left 03/24/2024   Procedure: ULTRASOUND GUIDANCE, FOR VASCULAR ACCESS;  Surgeon: Magda Debby SAILOR, MD;  Location: [facility] OR;  Service: Vascular;  Laterality: Left;   Patient Active Problem List   Diagnosis Date Noted   Pain of left upper extremity 04/24/2024   Acute postoperative anemia due to greater  than expected blood loss 03/26/2024   Obesity, class 1 03/26/2024   Aneurysm of axillary artery 03/24/2024   Axillary artery injury 03/12/2024   Acute CVA (cerebrovascular accident) (HCC) 03/02/2024   Essential hypertension 03/02/2024   Dyslipidemia 03/02/2024   Type 2 diabetes mellitus with hyperlipidemia (HCC) 03/02/2024   Erectile dysfunction 02/24/2023   Chronic eczematous otitis externa of both ears 02/05/2023   Tobacco abuse 12/25/2022   Malignant neoplasm of upper lobe of right lung (HCC) 09/25/2022   Cervical spondylosis with myelopathy 08/01/2022   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   DDD (degenerative disc disease), cervical 05/21/2022   Osteoarthritis of both hands 05/19/2022   History of GI bleed 11/13/2021   Insomnia 09/13/2021   Cirrhosis of liver (HCC) 11/08/2018   Barrett's esophagus 08/04/2018   Vitamin D  deficiency 06/30/2018   Alcohol  abuse 06/30/2018   DM type 2 causing vascular disease (HCC) 08/17/2017   Chronic combined systolic and diastolic CHF (congestive heart failure) (HCC) 05/15/2017   OSA (obstructive sleep apnea) 04/28/2017   Depression with anxiety 04/15/2017   GERD (gastroesophageal reflux disease) 09/23/2016   Asthmatic bronchitis , chronic (HCC) 08/27/2015   Mixed hyperlipidemia 08/27/2015   Class 1 obesity due to excess calories with serious comorbidity and body mass index (BMI) of 32.0 to 32.9 in adult 08/27/2015   CAD (coronary artery disease) 05/31/2015   Essential hypertension, benign 02/09/2015    PCP: Cook, Jayce G, DO   REFERRING PROVIDER: Noralee Elidia Sieving*   REFERRING DIAG: left axillary pseudoaneurysm   Rationale for Evaluation and Treatment:  Rehabiliation  THERAPY DIAG:  Muscle weakness (generalized)  Acute pain of left shoulder  ONSET DATE: 03/11/24   SUBJECTIVE:  SUBJECTIVE STATEMENT: Pain is a little better    PERTINENT HISTORY:  past medical history of diabetes mellitus type 2, hypertension, CAD, liver cirrhosis, anxiety, depression, OSA, CVA w/ left-sided weakness who had a recent hospitalization 7/11 to 03/19/24 post traumatic left shoulder dislocation, complicated with left axillary hematoma with left axillary pseudoaneurysm. On 07/12 he underwent open exposure and repair of the left axillary pseudoaneurysm and evacuation of left axillary hematoma.   PAIN:  NPRS scale: 4/10 Pain location:left shoulder/axillary, left forearm and wrist Pain description: constant, achy, sharp Aggravating factors: any use of this left arm. Relieving factors: rest, meds   PRECAUTIONS: ,  Shoulder  RED FLAGS: None   WEIGHT BEARING RESTRICTIONS:  Not anymore  FALLS:  Has patient fallen in last 6 months? Yes. Number of falls 1   OCCUPATION:  disabled  PLOF:  Independent with basic ADLs  PATIENT GOALS:  Reduce pain and get his arm back to normal  OBJECTIVE:  Note: Objective measures were completed at Evaluation unless otherwise noted.  DIAGNOSTIC FINDINGS:  Chest CT IMPRESSION: 1. Enlarging left axillary hematoma with arterial contrast extravasation from the posterior wall of the left axillary artery into a 1.5 x 1.8 cm pseudoaneurysm, with additional 1.8 x 1.1 cm pocket of contrast more posteriorly within the collection. 2. Increased heterogeneous hematoma in the left subscapularis region measuring 7.3 x 4.8 cm, but no focal contrast is seen within this. 3. Emphysema. 4. Right apical fiducial markers and post XRT changes, with treated nodule just above the fiducial markers today measuring 8 mm, previously 9 mm. 5. Aortic and coronary artery atherosclerosis.  Right aortic arch. 6. Enlarged pulmonary trunk 3.3 cm indicating arterial hypertension. 7. Cirrhosis and splenomegaly.  Prominent patent portal vein 1.7  cm. 8. Critical Value/emergent results were called by telephone at the time of interpretation on 03/24/2024 at 5:18 am to provider [facility] , who verbally acknowledged these results.  Shoulder XR IMPRESSION: No acute bony abnormality.   Postoperative changes in the left axilla.  No imaging for left elbow/wrist  06/03/24 EMG & NCS Examination  Impression: 1. This is an abnormal electrodiagnostic study of the left upper extremity 2. There is electrodiagnostic evidence of a diffuse, subacute brachial plexopathy affecting the upper, middle, and lower trunks of the left upper extremity with widespread denervation changes indicating ongoing axonal injury   PATIENT SURVEYS:  Patient-Specific Activity Scoring Scheme  0 represents "unable to perform." 10 represents "able to perform at prior level. 0 1 2 3 4 5 6 7 8 9  10 (Date and Score)   Activity Eval  06/21/24  1. Raising left arm  0 0   2. Putting on and off shirt 1  8  3.     4.    5.    Score 0.5/10 4/10   Total score = sum of the activity scores/number of activities Minimum detectable change (90%CI) for average score = 2 points Minimum detectable change (90%CI) for single activity score = 3 points     EDEMA:  Yes: moderate edema noted in left upper extremity from shoulder to hand  Observation:  2 incisions noted, one at proximal chest, one at axilla, both incisions closed with stitches and appear closed without any drainage or signs of infection at the moment. Dressing was removed and changed for him at eval.     UPPER EXTREMITY ROM:  AROM/Passive ROM Left eval Left 04/13/24 Left 05/24/24 Left 06/21/24  Shoulder flexion /50 /120 30/150 40/  Shoulder extension  Shoulder abduction /50 /100 20/140 22/  Shoulder adduction      Shoulder extension      Shoulder internal rotation /50     Shoulder external rotation /40 /70    Elbow flexion      Elbow extension      Wrist flexion      Wrist extension       Wrist ulnar deviation      Wrist radial deviation      Wrist pronation      Wrist supination       (Blank rows = not tested)   UPPER EXTREMITY MMT:  MMT Left eval Left 05/24/24 Left 06/21/24  Shoulder flexion 1 2 2   Shoulder extension     Shoulder abduction 1 2 2   Shoulder adduction     Shoulder extension     Shoulder internal rotation 1 2 4   Shoulder external rotation 1 2 3   Middle trapezius     Lower trapezius     Elbow flexion 1 3 4   Elbow extension 2 3 4   Wrist flexion     Wrist extension     Wrist ulnar deviation     Wrist radial deviation     Wrist pronation     Wrist supination     Grip strength      (Blank rows = not tested)                                                                                                                              TREATMENT DATE:  07/12/24 Theractivity UBE L1 X 10 min , able to maintain grip 5 min then needed left hand wrapped to handle the last 5 minutes, with ace wrap to maintain grip Wall ladder X 10 reps Standing p ball roll outs on mat table for AAROM flexion, abduction, circles CW & CCW X 10 reps each for reaching Gripping ace bandage X 25 Standing scapular retractions X15 with red band wrapped around hand Standing bicep curls 2X10  with red band around hand Standing chest press red X15 Standing IR red X 15  Standing ER red X 15 Blaze pods 3pods, using UE to turn off working on reaching horizontal abduction/adduction and hand control, pods placed on high mat, performed 30 sec X 3 with 30 sec rest in between Blaze pods 3 pods, using UE to turn off working on reaching flexion/extension and hand control, pods placed on high mat, performed 30 sec X 3 with 30 sec rest in between   07/06/24 Theractivity UBE L1 X 10 min with left hand wrapped to handle with ace wrap to maintain grip Standing p ball roll outs on mat table for AAROM flexion, abduction, circles CW & CCW X 15 reps each for reaching Gripping ace bandage X  25 Standing chest press AROM 2X10 Standing scapular retractions X10 with red band wrapped around hand Standing bicep curls with red band around hand Blaze pods 3pods,  using UE to turn off working on reaching horizontal abduction/adduction and hand control, pods placed on high mat, performed 30 sec X 3 with 30 sec rest in between Blaze pods 3 pods, using UE to turn off working on reaching flexion/extension and hand control, pods placed on high mat, performed 30 sec X 3 with 30 sec rest in between Wall ladder X 8 reps  Self care: education about his NCV test results, prognosis, healing times, treatment options. Printed out NVC impression for him.    PATIENT EDUCATION: Education details: HEP, PT plan of care, selfcare Person educated: Patient Education method: Explanation, Demonstration, Verbal cues, and Handouts Education comprehension: verbalized understanding, further education recommended   HOME EXERCISE PROGRAM: Access Code: [email or web address] URL: [email or web address] Date: 03/31/2024 Prepared by: Redell Moose  Exercises - Circular Shoulder Pendulum with Table Support  - 2-3 x daily - 7 x weekly - 1 sets - 15 reps - Seated Shoulder Abduction Towel Slide at Table Top  - 2-3 x daily - 6 x weekly - 1 sets - 10 reps - 5 sec hold - Seated Shoulder Flexion Towel Slide at Table Top  - 2-3 x daily - 6 x weekly - 1 sets - 10 reps - 5 sec hold - Seated Shoulder External Rotation PROM on Table  - 2-3 x daily - 6 x weekly - 1 sets - 10 reps - 5 sec hold - Seated Gripping Towel  - 2-3 x daily - 6 x weekly - 1 sets - 20 reps - Standing Elbow Flexion Extension AROM  - 2-3 x daily - 6 x weekly - 1 sets - 10 reps  ASSESSMENT:  CLINICAL IMPRESSION: NCV results found from [facility] showing Impression: 1. This is an abnormal electrodiagnostic study of the left upper extremity 2. There is electrodiagnostic evidence of a diffuse, subacute brachial plexopathy affecting the upper, middle, and lower  trunks of the left upper extremity with widespread denervation changes indicating ongoing axonal injury  This would explain his slow overall progress with his shoulder and hand. PT continued to work to improve strength and function of left UE.   OBJECTIVE IMPAIRMENTS: decreased activity tolerance for ADL's, difficulty walking, decreased balance, decreased endurance, decreased mobility, decreased ROM, decreased strength, impaired flexibility, impaired UE use, and pain.  ACTIVITY LIMITATIONS: bending, lifting, carry, reaching, locomotion, cleaning, community activity, driving,  PERSONAL FACTORS: see above PMH  also affecting patient's functional outcome.  REHAB POTENTIAL: Good  CLINICAL DECISION MAKING: Evolving/moderate complexity  EVALUATION COMPLEXITY: Moderate    GOALS: Short term PT Goals Target date: 04/28/2024   Pt will be I and compliant with HEP. Baseline:  Goal status: MET 04/18/24 Pt will decrease pain by 25% overall Baseline: Goal status: MET 04/18/24  Long term PT goals Target date:06/23/2024  Pt will improve right shoulder and elbow AROM to [facility] to improve functional mobility Baseline: Goal status: ongoing 07/12/24 Pt will improve  right arm strength to at least 4+/5 MMT to improve functional strength Baseline: Goal status: ongoing 07/12/24 Pt will improve Patient specific functional scale (PSFS) to at least 7/10 to show improved function level Baseline: Goal status: ongoing 07/12/24 Pt will reduce pain to overall less than 3/10 with usual activity Baseline: Goal status: ongoing 07/12/24 PLAN: PT FREQUENCY: 1-3 times per week   PT DURATION: 6-8 weeks  PLANNED INTERVENTIONS (unless contraindicated):  97110-Therapeutic exercises, 97530- Therapeutic activity, 97112- Neuromuscular re-education, 97535- Self Care, [id number]- Manual therapy, [id number]- Electrical stimulation (unattended), 97016- Vasopneumatic device, [id number]- Ultrasound, and [id number]- Ionotophoresis 4mg /ml  Dexamethasone   PLAN FOR NEXT SESSION: progress strength and function as tolerated  Redell JONELLE Moose, PT,DPT 07/12/2024, 9:16 AM

## 2024-07-19 ENCOUNTER — Ambulatory Visit: Admitting: Physical Therapy

## 2024-07-19 ENCOUNTER — Encounter: Payer: Self-pay | Admitting: Physical Therapy

## 2024-07-19 DIAGNOSIS — M6281 Muscle weakness (generalized): Secondary | ICD-10-CM | POA: Diagnosis not present

## 2024-07-19 DIAGNOSIS — M25512 Pain in left shoulder: Secondary | ICD-10-CM

## 2024-07-19 NOTE — Therapy (Signed)
 OUTPATIENT PHYSICAL THERAPY TREATMENT/Recert   Patient Name: TILFORD DEATON MRN: [id number] DOB:February 02, 1966, 58 y.o., male Today's Date: 07/19/2024  END OF SESSION:  PT End of Session - 07/19/24 0905     Visit Number 14    Number of Visits 24    Date for Recertification  10/11/24    Progress Note Due on Visit 20    PT Start Time 0845    PT Stop Time 0925    PT Time Calculation (min) 40 min    Activity Tolerance Patient tolerated treatment well    Behavior During Therapy [facility] for tasks assessed/performed             Past Medical History:  Diagnosis Date   ACE inhibitor-aggravated angioedema    Allergy     Anemia    Angio-edema    Anxiety    Arthritis    Asthma    hip replacement   Back pain    Bradycardia 04/28/2017   Bulging of cervical intervertebral disc    CAD (coronary artery disease)    lateral STEMI 02/06/2015 00% D1 occlusion treated with Promus Premier 2.5 mm x 16 mm DES, 70% ramus stenosis, 40% mid RCA stenosis, 45% distal RCA stenosis, EF 45-50%   CHF (congestive heart failure) (HCC)    COPD (chronic obstructive pulmonary disease) (HCC)    Depression    Diabetes mellitus without complication (HCC)    Difficult intubation    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   Dry eye    Dyspnea    Early satiety 09/23/2016   Fatty liver    GERD (gastroesophageal reflux disease)    HCAP (healthcare-associated pneumonia) 05/15/2017   Headache    Heart murmur    Hip pain    Hyperlipidemia    Hypertension    Lobar pneumonia 05/15/2017   Melena 08/04/2018   MI (myocardial infarction) (HCC)    Myocardial infarction (HCC)    Neck pain    Non-ST elevation (NSTEMI) myocardial infarction (HCC) 04/27/2017   NSTEMI (non-ST elevated myocardial infarction) (HCC) 04/26/2017   Otitis media    Pleurisy    Pneumonia due to COVID-19 virus 10/07/2019   Rectal bleeding 11/08/2018   Right shoulder pain 03/20/2020   Sinus pause    9 sec sinus pause on  telemetry after started on coreg  after MI, avoid AV nodal blocking agent   Sleep apnea    uses a cpap   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   STEMI (ST elevation myocardial infarction) (HCC) 05/31/2015   Substance abuse (HCC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   Syncope 06/29/2017   Syncope and collapse 05/15/2017   Transaminitis 08/04/2018   Unilateral primary osteoarthritis, right hip 07/01/2016   Past Surgical History:  Procedure Laterality Date   ARTERY EXPLORATION Left 03/24/2024   Procedure: LEFT ARCH AORTOGRAM, LEFT UPPER EXTREMITY ANGIOGRAM WITH FIRST ORDER CANNULATION, LEFT AXILLARY ANGIOPLASTY AND STENTING, EVACUATION OF LEFT AXILLARY HEMATOMA;  Surgeon: Magda Debby SAILOR, MD;  Location: [facility] OR;  Service: Vascular;  Laterality: Left;   ARTERY REPAIR Left 03/11/2024   Procedure: REPAIR LEFT AXILLARY ARTERY;  Surgeon: Serene Gaile ORN, MD;  Location: [facility] OR;  Service: Vascular;  Laterality: Left;  ARTERIAL REPAIR, LEFT AXILLARY   BIOPSY  10/09/2016   Procedure: BIOPSY;  Surgeon: Lamar CHRISTELLA Hollingshead, MD;  Location: [facility];  Service: Endoscopy;;   BIOPSY  11/28/2019   Procedure: BIOPSY;  Surgeon: Hollingshead Lamar CHRISTELLA, MD;  Location: [facility];  Service: Endoscopy;;   BIOPSY  06/15/2023   Procedure: BIOPSY;  Surgeon: Shaaron Lamar HERO, MD;  Location: [facility];  Service: Endoscopy;;   BRONCHIAL BIOPSY  10/16/2022   Procedure: BRONCHIAL BIOPSIES;  Surgeon: Gladis Leonor HERO, MD;  Location[facility] ENDOSCOPY;  Service: Pulmonary;;   BRONCHIAL NEEDLE ASPIRATION BIOPSY  10/16/2022   Procedure: BRONCHIAL NEEDLE ASPIRATION BIOPSIES;  Surgeon: Gladis Leonor HERO, MD;  Location: [facility] ENDOSCOPY;  Service: Pulmonary;;   BRONCHIAL WASHINGS  10/16/2022   Procedure: BRONCHIAL WASHINGS;  Surgeon: Gladis Leonor HERO, MD;  Location: [facility] ENDOSCOPY;  Service: Pulmonary;;   CARDIAC CATHETERIZATION N/A 02/06/2015   Procedure: Left Heart Cath and Coronary Angiography;  Surgeon: Alm LELON Clay, MD;  Location: [facility]  INVASIVE CV LAB;  Service: Cardiovascular;  Laterality: N/A;   CARDIAC CATHETERIZATION N/A 02/06/2015   Procedure: Coronary Stent Intervention;  Surgeon: Alm LELON Clay, MD;  Location: [facility] INVASIVE CV LAB;  Service: Cardiovascular;  Laterality: N/A;   COLONOSCOPY WITH PROPOFOL  N/A 10/09/2016   Sigmoid and descending colon diverticulosis, four 4-6 mm polyps in sigmoid, one 4 mm polyp in descending. Tubular adenomas and hyperplastic. 5 year surveillance.    COLONOSCOPY WITH PROPOFOL  N/A 11/24/2016   Sigmoid and descending colon diverticulosis, four 4-6 mm polyps in sigmoid, one 4 mm polyp in descending. Tubular adenomas and hyperplastic. 5 year surveillance.    COLONOSCOPY WITH PROPOFOL  N/A 10/18/2021   Carver: nonbleeding internal hemorrhoids, small and large mouth diverticula found in the sigmoid, descending, transverse colon.  A 9 mm sessile polyp was found in the ascending colon that was removed.  4 sessile polyps found in the sigmoid, descending, transverse colon 3 to 5 mm in size, examining otherwise.  Path revealed tubular adenomas.  Repeat due in 5 years for surveillance.   CORONARY ANGIOPLASTY WITH STENT PLACEMENT  01/2015   CORONARY STENT INTERVENTION N/A 04/27/2017   Procedure: CORONARY STENT INTERVENTION;  Surgeon: Mady Bruckner, MD;  Location: [facility] INVASIVE CV LAB;  Service: Cardiovascular;  Laterality: N/A;   ELECTROPHYSIOLOGY STUDY N/A 06/29/2017   Procedure: ELECTROPHYSIOLOGY STUDY;  Surgeon: Waddell Danelle LELON, MD;  Location: [facility] INVASIVE CV LAB;  Service: Cardiovascular;  Laterality: N/A;   ESOPHAGOGASTRODUODENOSCOPY (EGD) WITH PROPOFOL  N/A 10/09/2016   Dr. Shaaron: LA grade a esophagitis.  Barrett's esophagus, biopsy-proven.  Small hiatal hernia.  EGD February 2019   ESOPHAGOGASTRODUODENOSCOPY (EGD) WITH PROPOFOL  N/A 11/28/2019    salmon-colored esophageal mucosa (Barrett's) small hiatal hernia, portal hypertensive gastropathy, normal duodenum, 3 year surveillance    ESOPHAGOGASTRODUODENOSCOPY (EGD) WITH PROPOFOL  N/A 09/06/2021   three columns of grade 1 varices in distal esophagus, no stigmata of bleeding or red wale signs. Small hiatal hernia. Mild portal gastropathy. Normal duodenum. Repeat upper endoscopy in 1 year for surveillance.   ESOPHAGOGASTRODUODENOSCOPY (EGD) WITH PROPOFOL  N/A 06/15/2023   Procedure: ESOPHAGOGASTRODUODENOSCOPY (EGD) WITH PROPOFOL ;  Surgeon: Shaaron Lamar HERO, MD;  [facility];  Service: Endoscopy;  Laterality: N/A;  830am, asa 3   FIDUCIAL MARKER PLACEMENT  10/16/2022   Procedure: FIDUCIAL MARKER PLACEMENT;  Surgeon: Gladis Leonor HERO, MD;  Location: [facility] ENDOSCOPY;  Service: Pulmonary;;   INCISION / DRAINAGE HAND / FINGER     LEFT HEART CATH AND CORONARY ANGIOGRAPHY N/A 04/27/2017   Procedure: LEFT HEART CATH AND CORONARY ANGIOGRAPHY;  Surgeon: Mady Bruckner, MD;  Location: [facility] INVASIVE CV LAB;  Service: Cardiovascular;  Laterality: N/A;   LOOP RECORDER INSERTION  06/29/2017   Procedure: Loop Recorder Insertion;  Surgeon: Waddell Danelle LELON, MD;  Location:  [facility] INVASIVE CV LAB;  Service: Cardiovascular;;   POLYPECTOMY  11/24/2016   Procedure: POLYPECTOMY;  Surgeon: Lamar CHRISTELLA Hollingshead, MD;  [facility]: [facility];  Service: Endoscopy;;  descending and sigmoid   POLYPECTOMY  10/18/2021   Procedure: POLYPECTOMY;  Surgeon: Cindie Carlin POUR, DO;  Location: [facility];  Service: Endoscopy;;   TOTAL HIP ARTHROPLASTY Right 07/01/2016   TOTAL HIP ARTHROPLASTY Right 07/01/2016   Procedure: RIGHT TOTAL HIP ARTHROPLASTY ANTERIOR APPROACH;  Surgeon: Lonni CINDERELLA Poli, MD;  Location: [facility] OR;  Service: Orthopedics;  Laterality: Right;   ULTRASOUND GUIDANCE FOR VASCULAR ACCESS Left 03/24/2024   Procedure: ULTRASOUND GUIDANCE, FOR VASCULAR ACCESS;  Surgeon: Magda Debby SAILOR, MD;  Location: [facility] OR;  Service: Vascular;  Laterality: Left;   Patient Active Problem List   Diagnosis Date Noted   Pain of left upper extremity 04/24/2024    Acute postoperative anemia due to greater than expected blood loss 03/26/2024   Obesity, class 1 03/26/2024   Aneurysm of axillary artery 03/24/2024   Axillary artery injury 03/12/2024   Acute CVA (cerebrovascular accident) (HCC) 03/02/2024   Essential hypertension 03/02/2024   Dyslipidemia 03/02/2024   Type 2 diabetes mellitus with hyperlipidemia (HCC) 03/02/2024   Erectile dysfunction 02/24/2023   Chronic eczematous otitis externa of both ears 02/05/2023   Tobacco abuse 12/25/2022   Malignant neoplasm of upper lobe of right lung (HCC) 09/25/2022   Cervical spondylosis with myelopathy 08/01/2022   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   DDD (degenerative disc disease), cervical 05/21/2022   Osteoarthritis of both hands 05/19/2022   History of GI bleed 11/13/2021   Insomnia 09/13/2021   Cirrhosis of liver (HCC) 11/08/2018   Barrett's esophagus 08/04/2018   Vitamin D  deficiency 06/30/2018   Alcohol  abuse 06/30/2018   DM type 2 causing vascular disease (HCC) 08/17/2017   Chronic combined systolic and diastolic CHF (congestive heart failure) (HCC) 05/15/2017   OSA (obstructive sleep apnea) 04/28/2017   Depression with anxiety 04/15/2017   GERD (gastroesophageal reflux disease) 09/23/2016   Asthmatic bronchitis , chronic (HCC) 08/27/2015   Mixed hyperlipidemia 08/27/2015   Class 1 obesity due to excess calories with serious comorbidity and body mass index (BMI) of 32.0 to 32.9 in adult 08/27/2015   CAD (coronary artery disease) 05/31/2015   Essential hypertension, benign 02/09/2015    PCP: Cook, Jayce G, DO   REFERRING PROVIDER: Noralee Elidia Sieving*   REFERRING DIAG: left axillary pseudoaneurysm   Rationale for Evaluation and Treatment:  Rehabiliation  THERAPY DIAG:  Muscle weakness (generalized)  Acute pain of left shoulder  ONSET DATE: 03/11/24   SUBJECTIVE:  SUBJECTIVE STATEMENT: Pain is good today, able to use his arm a little better    PERTINENT HISTORY:  past medical history of diabetes mellitus type 2, hypertension, CAD, liver cirrhosis, anxiety, depression, OSA, CVA w/ left-sided weakness who had a recent hospitalization 7/11 to 03/19/24 post traumatic left shoulder dislocation, complicated with left axillary hematoma with left axillary pseudoaneurysm. On 07/12 he underwent open exposure and repair of the left axillary pseudoaneurysm and evacuation of left axillary hematoma.   PAIN:  NPRS scale: 1/10 Pain location:left shoulder/axillary, left forearm and wrist Pain description: constant, achy, sharp Aggravating factors: any use of this left arm. Relieving factors: rest, meds   PRECAUTIONS: ,  Shoulder  RED FLAGS: None   WEIGHT BEARING RESTRICTIONS:  Not anymore  FALLS:  Has patient fallen in last 6 months? Yes. Number of falls 1   OCCUPATION:  disabled  PLOF:  Independent with basic ADLs  PATIENT GOALS:  Reduce pain and get his arm back to normal  OBJECTIVE:  Note: Objective measures were completed at Evaluation unless otherwise noted.  DIAGNOSTIC FINDINGS:  Chest CT IMPRESSION: 1. Enlarging left axillary hematoma with arterial contrast extravasation from the posterior wall of the left axillary artery into a 1.5 x 1.8 cm pseudoaneurysm, with additional 1.8 x 1.1 cm pocket of contrast more posteriorly within the collection. 2. Increased heterogeneous hematoma in the left subscapularis region measuring 7.3 x 4.8 cm, but no focal contrast is seen within this. 3. Emphysema. 4. Right apical fiducial markers and post XRT changes, with treated nodule just above the fiducial markers today measuring 8 mm, previously 9 mm. 5. Aortic and coronary artery atherosclerosis.  Right aortic arch. 6. Enlarged pulmonary trunk 3.3 cm indicating arterial  hypertension. 7. Cirrhosis and splenomegaly.  Prominent patent portal vein 1.7 cm. 8. Critical Value/emergent results were called by telephone at the time of interpretation on 03/24/2024 at 5:18 am to provider [facility] , who verbally acknowledged these results.  Shoulder XR IMPRESSION: No acute bony abnormality.   Postoperative changes in the left axilla.  No imaging for left elbow/wrist  06/03/24 EMG & NCS Examination  Impression: 1. This is an abnormal electrodiagnostic study of the left upper extremity 2. There is electrodiagnostic evidence of a diffuse, subacute brachial plexopathy affecting the upper, middle, and lower trunks of the left upper extremity with widespread denervation changes indicating ongoing axonal injury   PATIENT SURVEYS:  Patient-Specific Activity Scoring Scheme  0 represents "unable to perform." 10 represents "able to perform at prior level. 0 1 2 3 4 5 6 7 8 9  10 (Date and Score)   Activity Eval  06/21/24  1. Raising left arm  0 0   2. Putting on and off shirt 1  8  3.     4.    5.    Score 0.5/10 4/10   Total score = sum of the activity scores/number of activities Minimum detectable change (90%CI) for average score = 2 points Minimum detectable change (90%CI) for single activity score = 3 points     EDEMA:  Yes: moderate edema noted in left upper extremity from shoulder to hand  Observation:  2 incisions noted, one at proximal chest, one at axilla, both incisions closed with stitches and appear closed without any drainage or signs of infection at the moment. Dressing was removed and changed for him at eval.     UPPER EXTREMITY ROM:  AROM/Passive ROM Left eval Left 04/13/24 Left 05/24/24 Left 06/21/24 Left 07/19/24  Shoulder  flexion /50 /120 30/150 40/   Shoulder extension       Shoulder abduction /50 /100 20/140 22/   Shoulder adduction       Shoulder extension       Shoulder internal rotation /50      Shoulder external  rotation /40 /70     Elbow flexion       Elbow extension       Wrist flexion       Wrist extension       Wrist ulnar deviation       Wrist radial deviation       Wrist pronation       Wrist supination        (Blank rows = not tested)   UPPER EXTREMITY MMT:  MMT Left eval Left 05/24/24 Left 06/21/24 Left 07/19/24  Shoulder flexion 1 2 2 2   Shoulder extension      Shoulder abduction 1 2 2 2   Shoulder adduction      Shoulder extension      Shoulder internal rotation 1 2 4 4   Shoulder external rotation 1 2 3  3+  Middle trapezius      Lower trapezius      Elbow flexion 1 3 4 4   Elbow extension 2 3 4 4   Wrist flexion      Wrist extension      Wrist ulnar deviation      Wrist radial deviation      Wrist pronation      Wrist supination      Grip strength       (Blank rows = not tested)                                                                                                                              TREATMENT DATE:  07/19/24 Theractivity UBE L1 X 10 min , able to maintain grip the whole 10 min now Wall ladder X 10 reps Standing p ball roll outs on mat table for AAROM flexion, abduction, circles CW & CCW X 10 reps each for reaching Gripping ace bandage X 25 Standing scapular retractions X15 with red band wrapped around hand Standing bicep curls 2X10  with red band around hand Standing chest press red X15 Standing IR red X 15  Standing ER red X 15 Blaze pods 3pods, using UE to turn off working on reaching horizontal abduction/adduction and hand control, pods placed on high mat, performed 30 sec X 3 with 30 sec rest in between Blaze pods 3 pods, using UE to turn off working on reaching flexion/extension and hand control, pods placed on high mat, performed 30 sec X 3 with 30 sec rest in between   PATIENT EDUCATION: Education details: HEP, PT plan of care, selfcare Person educated: Patient Education method: Explanation, Demonstration, Verbal cues, and  Handouts Education comprehension: verbalized understanding, further education recommended   HOME EXERCISE PROGRAM: Access Code: [email or web address] URL: [email or web address]  Date: 03/31/2024 Prepared by: Redell Moose  Exercises - Circular Shoulder Pendulum with Table Support  - 2-3 x daily - 7 x weekly - 1 sets - 15 reps - Seated Shoulder Abduction Towel Slide at Table Top  - 2-3 x daily - 6 x weekly - 1 sets - 10 reps - 5 sec hold - Seated Shoulder Flexion Towel Slide at Table Top  - 2-3 x daily - 6 x weekly - 1 sets - 10 reps - 5 sec hold - Seated Shoulder External Rotation PROM on Table  - 2-3 x daily - 6 x weekly - 1 sets - 10 reps - 5 sec hold - Seated Gripping Towel  - 2-3 x daily - 6 x weekly - 1 sets - 20 reps - Standing Elbow Flexion Extension AROM  - 2-3 x daily - 6 x weekly - 1 sets - 10 reps  ASSESSMENT: He is slowly improving functional use of his left arm but this remains limited. PT is working to develop compensatory strategies to make up for his lack of deltoid strength.   OBJECTIVE IMPAIRMENTS: decreased activity tolerance for ADL's, difficulty walking, decreased balance, decreased endurance, decreased mobility, decreased ROM, decreased strength, impaired flexibility, impaired UE use, and pain.  ACTIVITY LIMITATIONS: bending, lifting, carry, reaching, locomotion, cleaning, community activity, driving,  PERSONAL FACTORS: see above PMH  also affecting patient's functional outcome.  REHAB POTENTIAL: Good  CLINICAL DECISION MAKING: Evolving/moderate complexity  EVALUATION COMPLEXITY: Moderate    GOALS: Short term PT Goals Target date: 04/28/2024   Pt will be I and compliant with HEP. Baseline:  Goal status: MET 04/18/24 Pt will decrease pain by 25% overall Baseline: Goal status: MET 04/18/24  Long term PT goals Target date:06/23/2024  Pt will improve right shoulder and elbow AROM to [facility] to improve functional mobility Baseline: Goal status: ongoing  07/12/24 Pt will improve  right arm strength to at least 4+/5 MMT to improve functional strength Baseline: Goal status: ongoing 07/12/24 Pt will improve Patient specific functional scale (PSFS) to at least 7/10 to show improved function level Baseline: Goal status: ongoing 07/12/24 Pt will reduce pain to overall less than 3/10 with usual activity Baseline: Goal status: ongoing 07/12/24 PLAN: PT FREQUENCY: 1-3 times per week   PT DURATION: 6-8 weeks  PLANNED INTERVENTIONS (unless contraindicated):  97110-Therapeutic exercises, 97530- Therapeutic activity, 97112- Neuromuscular re-education, 97535- Self Care, 02859- Manual therapy, [id number]- Electrical stimulation (unattended), 97016- Vasopneumatic device, [id number]- Ultrasound, and 02966- Ionotophoresis 4mg /ml Dexamethasone   PLAN FOR NEXT SESSION: progress strength and function as tolerated  Redell JONELLE Moose, PT,DPT 07/19/2024, 10:12 AM

## 2024-07-24 NOTE — Progress Notes (Unsigned)
 GI Office Note    Referring Provider: Cook, Jayce G, DO Primary Care Physician:  Cook, Jayce G, DO Primary Gastroenterologist: Lamar HERO.Rourk, MD  Date:  07/25/2024  ID:  Randy Hebert, DOB 16-Dec-1965, MRN [id number]   Chief Complaint   Chief Complaint  Patient presents with   Follow-up    Follow up. Still having loose bowels    History of Present Illness  Randy Hebert is a 58 y.o. male with a history of cirrhosis, anxiety, asthma, CAD, depression, diabetes, GERD, hyperlipidemia, HTN, and history of MI  presenting today with complaint of ongoing loose stools.  EGD 06/15/23: - Innocent appearing grade 1 esophageal varices? 2 columns.  - Abnormal distal esophagus consistent with prior diagnosis of Barrett' s esophagus. Status post biopsy.  - Portal hypertensive gastropathy.  - Normal duodenal bulb and second portion of the duodenum. - Surveillance EGD recommended in 18 months.    OV 10/27/23 with Dr. Shaaron.History of gallbladder polyp and due for surveillance US . Patient stated he is not alcoholic. Denied drinking and driving. Reflux controlled with PPI BID.  History of grade 1 esophageal varices, not BB candidate. Urged alcohol  cessation. Discussed need for EGD April 2026 and colonoscopy in 2028. Advised update MELD labs.  Last office visit 04/26/2024. Recent MELD 11.  Imaging last in July with stable cirrhosis and no focal abnormality.  Was experiencing dark green/loose stools occurring 3-4 times a day for the past 2 months.  Bowel movements not been solid since his stroke in June.  Lost approximate 24 pounds.  Had a previous episode of red discoloration in his stool which he attributed to drinking Powerade.  He is concerned about his liver health especially the bruising.  Drinks chicken soup and milk to stay hydrated but struggles to maintain his weight and appetite.  Currently on Plavix  and iron supplementation which affect his stool color.  He reported he previously experience  hallucinations while on oxycodone  but since he stopped the medication it resolved. Advised early colonoscopy if ongoing weight loss. Stool studies ordered. Consider elastase if further diarrhea along with weight loss. Schedule EGD for February. US  due January 2026 along with labs. Continue PPI for GERD and barrett's. Follow up in 2 months for weight check.  Stools studies 04/28/24 positive for Cdiff on GI profie PCR although negative toxin. Sent in dificid  for treatment.   Today:  Discussed the use of AI scribe software for clinical note transcription with the patient, who gave verbal consent to proceed.  He experiences ongoing loose stools despite completing a course of antibiotics. The stools are described as 'snotty looking' and 'flaky,' not completely fluid but not solid either, and more 'mushy.' He has a sensation of fullness but observes only small amounts when he flushes. Bowel movements occur mostly in the morning, about three to four times, with occasional urgency after meals. Despite eating solid foods like meatloaf and chicken, the stools remain not solid.  He tried lactulose  to aid bowel movements, but it only caused gas without improving stool consistency. The color and odor of the stools have improved since taking antibiotics, now appearing more normal and brownish, though sometimes with a green tint. No abdominal pain is reported, but there is mention of weight loss due to a previous injury, with some recent weight gain.  Early morning bowel movements occur, sometimes as early as 2 AM, accompanied by cramps. No red or bleeding in the stool is noted. Appetite is slowly returning, though meals are sometimes  skipped until late morning. Occasional hunger pains occur at night, but he does not eat.  He has a history of nerve pain for which pregabalin is taken. A past injury affected his shoulder and side, causing significant pain and muscle loss. He is undergoing physical therapy and has  adapted to some movement limitations.  He smokes about a pack a day and consumes alcohol  occasionally, typically two small beers with meals like spaghetti and meatballs. Some weight gain of approximately seven to eight pounds has been noted since the last visit.  Occasional soreness in the left lower side but no sharp pain. No extra swelling in the belly or issues with a small hernia.      Wt Readings from Last 15 Encounters:  07/25/24 178 lb 12.8 oz (81.1 kg)  07/01/24 176 lb (79.8 kg)  06/30/24 172 lb (78 kg)  06/28/24 178 lb 2.1 oz (80.8 kg)  06/08/24 174 lb 6.4 oz (79.1 kg)  06/02/24 171 lb 6.4 oz (77.7 kg)  05/03/24 172 lb 4.8 oz (78.2 kg)  04/26/24 171 lb 3.2 oz (77.7 kg)  04/21/24 176 lb (79.8 kg)  04/21/24 175 lb (79.4 kg)  04/18/24 178 lb (80.7 kg)  04/11/24 177 lb 12.8 oz (80.6 kg)  04/05/24 181 lb (82.1 kg)  03/24/24 190 lb (86.2 kg)  03/21/24 191 lb (86.6 kg)   Body mass index is 29.75 kg/m.  Current Outpatient Medications  Medication Sig Dispense Refill   acetaminophen  (TYLENOL ) 500 MG tablet Take 2 tablets (1,000 mg total) by mouth every 8 (eight) hours as needed for mild pain or fever.     amLODipine  (NORVASC ) 10 MG tablet TAKE ONE TABLET BY MOUTH DAILY 90 tablet 1   Ascorbic Acid  (VITAMIN C  PO) Take 1 tablet by mouth daily.     aspirin  EC 81 MG tablet Take 1 tablet (81 mg total) by mouth daily. Swallow whole. 30 tablet 12   atorvastatin  (LIPITOR ) 80 MG tablet Take 1 tablet (80 mg total) by mouth daily. 30 tablet 2   Cholecalciferol  (VITAMIN D -3 PO) Take 1 capsule by mouth daily.     citalopram  (CELEXA ) 20 MG tablet Take 1 tablet (20 mg total) by mouth daily. 90 tablet 1   clopidogrel  (PLAVIX ) 75 MG tablet Take 1 tablet (75 mg total) by mouth daily. 30 tablet 6   dapagliflozin  propanediol (FARXIGA ) 10 MG TABS tablet Take 1 tablet (10 mg total) by mouth daily. 90 tablet 0   Dulaglutide  (TRULICITY ) 1.5 MG/0.5ML SOAJ Inject 1.5 mg into the skin once a week. 6 mL 0    ezetimibe  (ZETIA ) 10 MG tablet TAKE ONE TABLET BY MOUTH DAILY 90 tablet 1   Ferrous Sulfate  (IRON PO) Take 1 tablet by mouth daily.     furosemide  (LASIX ) 40 MG tablet Take 1 tablet (40 mg total) by mouth daily as needed (excessive swelling). 30 tablet 6   MAGNESIUM  PO Take 1 tablet by mouth 2 (two) times daily.     nitroGLYCERIN  (NITROSTAT ) 0.4 MG SL tablet Place 1 tablet (0.4 mg total) under the tongue every 5 (five) minutes x 3 doses as needed (if no relief after 3rd dose, proceed to ED or call 911). 25 tablet 3   pantoprazole  (PROTONIX ) 40 MG tablet TAKE (1) TABLET BY MOUTH TWICE DAILY BEFORE A MEAL. 180 tablet 1   pregabalin (LYRICA) 75 MG capsule Take 75 mg by mouth daily.     RESTASIS  0.05 % ophthalmic emulsion Place 1 drop into both eyes 2 (two)  times daily.     SYMBICORT  160-4.5 MCG/ACT inhaler INHALE 2 PUFFS INTO THE LUNGS FIRST THING IN THE MORNING AND 2 PUFFS ABOUT 12 HOURS LATER. 10.2 g 12   No current facility-administered medications for this visit.    Past Medical History:  Diagnosis Date   ACE inhibitor-aggravated angioedema    Allergy     Anemia    Angio-edema    Anxiety    Arthritis    Asthma    hip replacement   Back pain    Bradycardia 04/28/2017   Bulging of cervical intervertebral disc    CAD (coronary artery disease)    lateral STEMI 02/06/2015 00% D1 occlusion treated with Promus Premier 2.5 mm x 16 mm DES, 70% ramus stenosis, 40% mid RCA stenosis, 45% distal RCA stenosis, EF 45-50%   CHF (congestive heart failure) (HCC)    COPD (chronic obstructive pulmonary disease) (HCC)    Depression    Diabetes mellitus without complication (HCC)    Difficult intubation    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   Dry eye    Dyspnea    Early satiety 09/23/2016   Fatty liver    GERD (gastroesophageal reflux disease)    HCAP (healthcare-associated pneumonia) 05/15/2017   Headache    Heart murmur    Hip pain    Hyperlipidemia    Hypertension    Lobar  pneumonia 05/15/2017   Melena 08/04/2018   MI (myocardial infarction) [facility])    Myocardial infarction (HCC)    Neck pain    Non-ST elevation (NSTEMI) myocardial infarction (HCC) 04/27/2017   NSTEMI (non-ST elevated myocardial infarction) (HCC) 04/26/2017   Otitis media    Pleurisy    Pneumonia due to COVID-19 virus 10/07/2019   Rectal bleeding 11/08/2018   Right shoulder pain 03/20/2020   Sinus pause    9 sec sinus pause on telemetry after started on coreg  after MI, avoid AV nodal blocking agent   Sleep apnea    uses a cpap   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   STEMI (ST elevation myocardial infarction) (HCC) 05/31/2015   Substance abuse (HCC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   Syncope 06/29/2017   Syncope and collapse 05/15/2017   Transaminitis 08/04/2018   Unilateral primary osteoarthritis, right hip 07/01/2016    Past Surgical History:  Procedure Laterality Date   ARTERY EXPLORATION Left 03/24/2024   Procedure: LEFT ARCH AORTOGRAM, LEFT UPPER EXTREMITY ANGIOGRAM WITH FIRST ORDER CANNULATION, LEFT AXILLARY ANGIOPLASTY AND STENTING, EVACUATION OF LEFT AXILLARY HEMATOMA;  Surgeon: Magda Debby SAILOR, MD;  Location: [facility] OR;  Service: Vascular;  Laterality: Left;   ARTERY REPAIR Left 03/11/2024   Procedure: REPAIR LEFT AXILLARY ARTERY;  Surgeon: Serene Gaile ORN, MD;  Location: [facility] OR;  Service: Vascular;  Laterality: Left;  ARTERIAL REPAIR, LEFT AXILLARY   BIOPSY  10/09/2016   Procedure: BIOPSY;  Surgeon: Lamar CHRISTELLA Hollingshead, MD;  Location: [facility] ENDO SUITE;  Service: Endoscopy;;   BIOPSY  11/28/2019   Procedure: BIOPSY;  Surgeon: Hollingshead Lamar CHRISTELLA, MD;  Location: [facility];  Service: Endoscopy;;   BIOPSY  06/15/2023   Procedure: BIOPSY;  Surgeon: Hollingshead Lamar CHRISTELLA, MD;  Location: [facility];  Service: Endoscopy;;   BRONCHIAL BIOPSY  10/16/2022   Procedure: BRONCHIAL BIOPSIES;  Surgeon: Gladis Leonor CHRISTELLA, MD;  Location: [facility] ENDOSCOPY;  Service: Pulmonary;;   BRONCHIAL NEEDLE ASPIRATION  BIOPSY  10/16/2022   Procedure: BRONCHIAL NEEDLE ASPIRATION BIOPSIES;  Surgeon: Gladis Leonor CHRISTELLA, MD;  Location: [facility] ENDOSCOPY;  Service: Pulmonary;;   BRONCHIAL WASHINGS  10/16/2022   Procedure: BRONCHIAL WASHINGS;  Surgeon: Gladis Leonor HERO, MD;  Location: [facility] ENDOSCOPY;  Service: Pulmonary;;   CARDIAC CATHETERIZATION N/A 02/06/2015   Procedure: Left Heart Cath and Coronary Angiography;  Surgeon: Alm LELON Clay, MD;  Location: [facility] INVASIVE CV LAB;  Service: Cardiovascular;  Laterality: N/A;   CARDIAC CATHETERIZATION N/A 02/06/2015   Procedure: Coronary Stent Intervention;  Surgeon: Alm LELON Clay, MD;  Location: [facility] INVASIVE CV LAB;  Service: Cardiovascular;  Laterality: N/A;   COLONOSCOPY WITH PROPOFOL  N/A 10/09/2016   Sigmoid and descending colon diverticulosis, four 4-6 mm polyps in sigmoid, one 4 mm polyp in descending. Tubular adenomas and hyperplastic. 5 year surveillance.    COLONOSCOPY WITH PROPOFOL  N/A 11/24/2016   Sigmoid and descending colon diverticulosis, four 4-6 mm polyps in sigmoid, one 4 mm polyp in descending. Tubular adenomas and hyperplastic. 5 year surveillance.    COLONOSCOPY WITH PROPOFOL  N/A 10/18/2021   Carver: nonbleeding internal hemorrhoids, small and large mouth diverticula found in the sigmoid, descending, transverse colon.  A 9 mm sessile polyp was found in the ascending colon that was removed.  4 sessile polyps found in the sigmoid, descending, transverse colon 3 to 5 mm in size, examining otherwise.  Path revealed tubular adenomas.  Repeat due in 5 years for surveillance.   CORONARY ANGIOPLASTY WITH STENT PLACEMENT  01/2015   CORONARY STENT INTERVENTION N/A 04/27/2017   Procedure: CORONARY STENT INTERVENTION;  Surgeon: Mady Bruckner, MD;  Location: [facility] INVASIVE CV LAB;  Service: Cardiovascular;  Laterality: N/A;   ELECTROPHYSIOLOGY STUDY N/A 06/29/2017   Procedure: ELECTROPHYSIOLOGY STUDY;  Surgeon: Waddell Danelle LELON, MD;  Location: [facility] INVASIVE CV LAB;  Service:  Cardiovascular;  Laterality: N/A;   ESOPHAGOGASTRODUODENOSCOPY (EGD) WITH PROPOFOL  N/A 10/09/2016   Dr. Shaaron: LA grade a esophagitis.  Barrett's esophagus, biopsy-proven.  Small hiatal hernia.  EGD February 2019   ESOPHAGOGASTRODUODENOSCOPY (EGD) WITH PROPOFOL  N/A 11/28/2019    salmon-colored esophageal mucosa (Barrett's) small hiatal hernia, portal hypertensive gastropathy, normal duodenum, 3 year surveillance   ESOPHAGOGASTRODUODENOSCOPY (EGD) WITH PROPOFOL  N/A 09/06/2021   three columns of grade 1 varices in distal esophagus, no stigmata of bleeding or red wale signs. Small hiatal hernia. Mild portal gastropathy. Normal duodenum. Repeat upper endoscopy in 1 year for surveillance.   ESOPHAGOGASTRODUODENOSCOPY (EGD) WITH PROPOFOL  N/A 06/15/2023   Procedure: ESOPHAGOGASTRODUODENOSCOPY (EGD) WITH PROPOFOL ;  Surgeon: Shaaron Lamar HERO, MD;  [facility];  Service: Endoscopy;  Laterality: N/A;  830am, asa 3   FIDUCIAL MARKER PLACEMENT  10/16/2022   Procedure: FIDUCIAL MARKER PLACEMENT;  Surgeon: Gladis Leonor HERO, MD;  Location: [facility] ENDOSCOPY;  Service: Pulmonary;;   INCISION / DRAINAGE HAND / FINGER     LEFT HEART CATH AND CORONARY ANGIOGRAPHY N/A 04/27/2017   Procedure: LEFT HEART CATH AND CORONARY ANGIOGRAPHY;  Surgeon: Mady Bruckner, MD;  Location: [facility] INVASIVE CV LAB;  Service: Cardiovascular;  Laterality: N/A;   LOOP RECORDER INSERTION  06/29/2017   Procedure: Loop Recorder Insertion;  Surgeon: Waddell Danelle LELON, MD;  Location: [facility] INVASIVE CV LAB;  Service: Cardiovascular;;   POLYPECTOMY  11/24/2016   Procedure: POLYPECTOMY;  Surgeon: Lamar HERO Shaaron, MD;  [facility];  Service: Endoscopy;;  descending and sigmoid   POLYPECTOMY  10/18/2021   Procedure: POLYPECTOMY;  Surgeon: Cindie Carlin POUR, DO;  Location: [facility];  Service: Endoscopy;;   TOTAL HIP ARTHROPLASTY Right 07/01/2016   TOTAL HIP ARTHROPLASTY Right 07/01/2016  Procedure: RIGHT TOTAL HIP ARTHROPLASTY  ANTERIOR APPROACH;  Surgeon: Lonni CINDERELLA Poli, MD;  Location: [facility] OR;  Service: Orthopedics;  Laterality: Right;   ULTRASOUND GUIDANCE FOR VASCULAR ACCESS Left 03/24/2024   Procedure: ULTRASOUND GUIDANCE, FOR VASCULAR ACCESS;  Surgeon: Magda Debby SAILOR, MD;  Location: [facility] OR;  Service: Vascular;  Laterality: Left;    Family History  Problem Relation Age of Onset   Heart attack Father    Stroke Father    Arthritis Father    Heart disease Father    Cancer Mother        ???   Arthritis Mother    Heart disease Brother 61       died in sleep   Early death Brother    Diabetes Maternal Uncle    Alzheimer's disease Maternal Grandmother     Allergies as of 07/25/2024 - Review Complete 07/25/2024  Allergen Reaction Noted   Coreg  [carvedilol ] Other (See Comments) 02/08/2015   Zestril  [lisinopril ] Anaphylaxis, Shortness Of Breath, and Swelling 05/30/2015   Chantix  [varenicline ] Other (See Comments) 07/16/2021   Amoxil  [amoxicillin ] Nausea Only 02/06/2015    Social History   Socioeconomic History   Marital status: Divorced    Spouse name: alice   Number of children: 3   Years of education: 12   Highest education level: Not on file  Occupational History   Occupation: SSI  Tobacco Use   Smoking status: Every Day    Current packs/day: 1.50    Average packs/day: 1.5 packs/day for 46.0 years (69.0 ttl pk-yrs)    Types: Cigarettes    Passive exposure: Never   Smokeless tobacco: Former    Types: Chew   Tobacco comments:    Smokes 1 pack a day of cigarettes (20 cig)10/16/23  Vaping Use   Vaping status: Former  Substance and Sexual Activity   Alcohol  use: Yes    Comment: rare beer use   Drug use: No    Comment: history of drug use- marijuana, cocaine- quit 2014   Sexual activity: Yes    Partners: Female    Birth control/protection: None  Other Topics Concern   Not on file  Social History Narrative   Lives with girlfriend [name]   3 children, but 1 OD  In 2019.    Grandchildren-2      Two cats: may and princess; and a dog-foxy      Enjoy: spending time with pets, TV, yard work       Diet: eats all food groups   Caffeine: half and half coffee, some tea-half and half decaf   Water : 6-8 cups daily      Wears seat belt   Does not use phone while driving   Smoke detectors at home      Social Drivers of Health   Financial Resource Strain: Low Risk  (01/08/2024)   Overall Financial Resource Strain (CARDIA)    Difficulty of Paying Living Expenses: Not hard at all  Food Insecurity: No Food Insecurity (03/24/2024)   Hunger Vital Sign    Worried About Running Out of Food in the Last Year: Never true    Ran Out of Food in the Last Year: Never true  Transportation Needs: No Transportation Needs (03/24/2024)   PRAPARE - Administrator, Civil Service (Medical): No    Lack of Transportation (Non-Medical): No  Physical Activity: Insufficiently Active (01/08/2024)   Exercise Vital Sign    Days of Exercise per Week: 2 days    Minutes  of Exercise per Session: 60 min  Stress: No Stress Concern Present (01/08/2024)   [employer] of Occupational Health - Occupational Stress Questionnaire    Feeling of Stress : Not at all  Social Connections: Moderately Integrated (03/02/2024)   Social Connection and Isolation Panel    Frequency of Communication with Friends and Family: More than three times a week    Frequency of Social Gatherings with Friends and Family: Once a week    Attends Religious Services: More than 4 times per year    Active Member of [employer] or Organizations: Yes    Attends Engineer, Structural: More than 4 times per year    Marital Status: Divorced    Review of Systems   Gen: Denies fever, chills, anorexia. Denies fatigue, weakness, weight loss.  CV: Denies chest pain, palpitations, syncope, peripheral edema, and claudication. Resp: Denies dyspnea at rest, cough, wheezing, coughing up blood, and pleurisy. GI: See HPI Derm:  + dry skin and bruising. Denies rash, itching,  + joint pain and nerve pain.  Psych: Denies depression, anxiety, memory loss, confusion. No homicidal or suicidal ideation.  Heme: + bruising. Denies bleeding, and enlarged lymph nodes.  Physical Exam   BP 137/83 (BP Location: Right Arm, Patient Position: Sitting, Cuff Size: Large)   Pulse 88   Temp 97.7 F (36.5 C) (Temporal)   Ht 5' 5 (1.651 m)   Wt 178 lb 12.8 oz (81.1 kg)   BMI 29.75 kg/m   General:   Alert and oriented. No distress noted. Pleasant and cooperative.  Head:  Normocephalic and atraumatic. Eyes:  Conjuctiva clear without scleral icterus. Mouth:  Oral mucosa pink and moist. Good dentition. No lesions. Lungs:  Clear to auscultation bilaterally. No wheezes, rales, or rhonchi. No distress.  Heart:  S1, S2 present without murmurs appreciated.  Abdomen:  +BS, soft, distended. Mild ttp to LLQ. No rebound or guarding. Ventral hernia involving umbilicus soft and non-tender.  Rectal: deferred Msk: Normal posture. Limited movement of let arm.  Extremities:  Without edema. Neurologic:  Alert and  oriented x4 Psych:  Alert and cooperative. Normal mood and affect.  Assessment & Plan  Randy Hebert is a 58 y.o. male presenting today with complaint of loose stools.      Chronic diarrhea with history of Clostridioides difficile infection Chronic diarrhea persists despite completion of antibiotic course for Clostridioides difficile infection. Stools are loose, mushy, and occur multiple times in the morning, with occasional nighttime episodes. No abdominal pain, but weight loss noted previously - currently maintaining. Differential includes microscopic colitis, polyps, malignancy, EPI. Improvement in stool color and odor suggests resolution of C. diff infection. No red in stool or bleeding reported. Appetite has returned slightly.  - Ordered colonoscopy to evaluate for microscopic colitis, polyps, malignancy given nocturnal  stools. - Advised to avoid fatty foods and follow a bland diet. - Instructed to take half a dose of Imodium if stools exceed six times a day. - Advised to report any worsening diarrhea or return of odor after antibiotic use. - If colonoscopy negative, will trial pancreatic enzymes and check fecal elastase.  For now we will continue pantoprazole  twice daily, if you have a recurrent C. difficile, we will need to consider reducing your pantoprazole .  Cirrhosis Prior liver contusion after his fall earlier this year.  No current signs of hepatic encephalopathy.  MELD score has been stable at last visit with labs from July.  EGD in October 2024 with grade 1 varices, not  a beta-blocker candidate.  Remains on Plavix .  Still does drink alcohol  occasionally, about 1-2 12oz beers once or twice a week.  Imaging in July also without hepatoma.  No confusion, ascites, or significant peripheral edema. Weight loss noted, but no acute complications related to cirrhosis.  Due for labs and imaging surveillance in January.  - Check CBC, MELD labs and AFP in January - Schedule liver US  in January - Protein shake (Ensure or Boost) every night before going to sleep - Discussed avoidance of alcohol  - Provided separate written education about alcoholic liver disease as well as a Mediterranean diet  Procedure order: Proceed with colonoscopy with propofol  by Dr. Shaaron in near future: the risks, benefits, and alternatives have been discussed with the patient in detail. The patient states understanding and desires to proceed. ASA 3  Hold Farxiga  for 3 days  Hold Trulicity  for 1 week  Hold iron for 1 week  Cardiac clearance - medical  Consider following up for reflux and Barrett's esophagus at next visit.  Follow up   Follow up February after imaging and labs completed in Jan.   Charmaine Melia, MSN, FNP-BC, AGACNP-BC [facility]

## 2024-07-25 ENCOUNTER — Encounter: Payer: Self-pay | Admitting: Gastroenterology

## 2024-07-25 ENCOUNTER — Telehealth: Payer: Self-pay | Admitting: *Deleted

## 2024-07-25 ENCOUNTER — Ambulatory Visit: Admitting: Gastroenterology

## 2024-07-25 ENCOUNTER — Telehealth (HOSPITAL_BASED_OUTPATIENT_CLINIC_OR_DEPARTMENT_OTHER): Payer: Self-pay | Admitting: *Deleted

## 2024-07-25 VITALS — BP 137/83 | HR 88 | Temp 97.7°F | Ht 65.0 in | Wt 178.8 lb

## 2024-07-25 DIAGNOSIS — K7030 Alcoholic cirrhosis of liver without ascites: Secondary | ICD-10-CM | POA: Diagnosis not present

## 2024-07-25 DIAGNOSIS — A498 Other bacterial infections of unspecified site: Secondary | ICD-10-CM

## 2024-07-25 DIAGNOSIS — R197 Diarrhea, unspecified: Secondary | ICD-10-CM | POA: Diagnosis not present

## 2024-07-25 NOTE — Telephone Encounter (Signed)
 Pt has been scheduled tele preop appt 08/04/24, med rec and consent are done.   Pt has a question about his heart monitor. I assured pt I will have the monitor techs reach out to him. Pt also stated he needs a refill on Atorvastatin , I will send message to Dr. Alvan for refill.     Patient Consent for Virtual Visit        Randy Hebert has provided verbal consent on 07/25/2024 for a virtual visit (video or telephone).   CONSENT FOR VIRTUAL VISIT FOR:  Randy Hebert  By participating in this virtual visit I agree to the following:  I hereby voluntarily request, consent and authorize [facility] and its employed or contracted physicians, physician assistants, nurse practitioners or other licensed health care professionals (the Practitioner), to provide me with telemedicine health care services (the "Services) as deemed necessary by the treating Practitioner. I acknowledge and consent to receive the Services by the Practitioner via telemedicine. I understand that the telemedicine visit will involve communicating with the Practitioner through live audiovisual communication technology and the disclosure of certain medical information by electronic transmission. I acknowledge that I have been given the opportunity to request an in-person assessment or other available alternative prior to the telemedicine visit and am voluntarily participating in the telemedicine visit.  I understand that I have the right to withhold or withdraw my consent to the use of telemedicine in the course of my care at any time, without affecting my right to future care or treatment, and that the Practitioner or I may terminate the telemedicine visit at any time. I understand that I have the right to inspect all information obtained and/or recorded in the course of the telemedicine visit and may receive copies of available information for a reasonable fee.  I understand that some of the potential risks of  receiving the Services via telemedicine include:  Delay or interruption in medical evaluation due to technological equipment failure or disruption; Information transmitted may not be sufficient (e.g. poor resolution of images) to allow for appropriate medical decision making by the Practitioner; and/or  In rare instances, security protocols could fail, causing a breach of personal health information.  Furthermore, I acknowledge that it is my responsibility to provide information about my medical history, conditions and care that is complete and accurate to the best of my ability. I acknowledge that Practitioner's advice, recommendations, and/or decision may be based on factors not within their control, such as incomplete or inaccurate data provided by me or distortions of diagnostic images or specimens that may result from electronic transmissions. I understand that the practice of medicine is not an exact science and that Practitioner makes no warranties or guarantees regarding treatment outcomes. I acknowledge that a copy of this consent can be made available to me via my patient portal [facility]), or I can request a printed copy by calling the office of [facility].    I understand that my insurance will be billed for this visit.   I have read or had this consent read to me. I understand the contents of this consent, which adequately explains the benefits and risks of the Services being provided via telemedicine.  I have been provided ample opportunity to ask questions regarding this consent and the Services and have had my questions answered to my satisfaction. I give my informed consent for the services to be provided through the use of telemedicine in my medical care

## 2024-07-25 NOTE — Telephone Encounter (Signed)
  Request for patient to stop medication prior to procedure or is needing cleareance  07/25/24  Randy Hebert 03/16/1966  What type of surgery is being performed? Colonoscopy  When is surgery scheduled? TBD  What type of clearance is required (medical or pharmacy to hold medication or both? MEDICAL  Patient is needing to have cardiac clearance prior to being scheduled for procedure.  Name of physician performing surgery?  Dr. Lamar Shaaron [facility] Phone: [phone], option 5 Fax: [phone]  Anesthesia type (none, local, MAC, general)? MAC   ? Yes ? No Patient can hold medication as requested   Signature: ___________________________

## 2024-07-25 NOTE — Telephone Encounter (Signed)
 Pt has been scheduled tele preop appt 08/04/24, med rec and consent are done.   Pt has a question about his heart monitor. I assured pt I will have the monitor techs reach out to him. Pt also stated he needs a refill on Atorvastatin , I will send message to Dr. Alvan for refill.

## 2024-07-25 NOTE — Patient Instructions (Signed)
 Given your change in bowel habits and your prior weight loss, we will go ahead with scheduling colonoscopy.   Cirrhosis Lifestyle Recommendations:  High-protein diet from a primarily plant-based diet. Avoid red meat.  No raw or undercooked meat, seafood, or shellfish. Low-fat/cholesterol/carbohydrate diet. Limit sodium to no more than 2000 mg/day including everything that you eat and drink. Recommend at least 30 minutes of aerobic and resistance exercise 3 days/week. Limit Tylenol  to 2000 mg daily.  AVOID ALCOHOL  ALTOGETHER  I am providing you some information about liver disease because of alcohol  use.  This should include some dietary stuff as well.  I also attached a separate handout about the Mediterranean diet which I recommend for your liver health.  For your loose stools, only if you have more than 6/day I want you to take half a dose of Imodium (1 mg).  This is an over-the-counter antidiarrheal medication.  It is important that given your cirrhosis that you have at least 3-4 semiformed bowel movements a day.  If you begin to develop any fevers, blood in your stool, change in smell of your stool again please let me know  so we can repeat testing for C. difficile.  Follow-up in February.  You will need to have labs and ultrasound of your liver in January.  It was a pleasure to see you today. I want to create trusting relationships with patients. If you receive a survey regarding your visit,  I greatly appreciate you taking time to fill this out on paper or through your MyChart. I value your feedback.  Charmaine Melia, MSN, FNP-BC, AGACNP-BC [facility]

## 2024-07-25 NOTE — Telephone Encounter (Signed)
   Name: Randy Hebert  DOB: 1966/03/23  MRN: [id number]  Primary Cardiologist: Alvan Carrier, MD   Preoperative team, please contact this patient and set up a phone call appointment for further preoperative risk assessment. Please obtain consent and complete medication review. Thank you for your help.  I confirm that guidance regarding antiplatelet and oral anticoagulation therapy has been completed and, if necessary, noted below.  He may hold Plavix  for 5 days prior to procedure. Please resume Plavix  as soon as possible postprocedure, at the discretion of the surgeon.  Ideally aspirin  should be continued without interruption.  I also confirmed the patient resides in the state of [place] . As per [facility] Medical [facility] telemedicine laws, the patient must reside in the state in which the provider is licensed.   Lum LITTIE Louis, NP 07/25/2024, 9:11 AM [facility]

## 2024-07-26 ENCOUNTER — Other Ambulatory Visit: Payer: Self-pay | Admitting: *Deleted

## 2024-07-26 ENCOUNTER — Encounter: Payer: Self-pay | Admitting: *Deleted

## 2024-07-26 DIAGNOSIS — K7460 Unspecified cirrhosis of liver: Secondary | ICD-10-CM

## 2024-08-01 ENCOUNTER — Telehealth: Payer: Self-pay | Admitting: Cardiology

## 2024-08-01 MED ORDER — ATORVASTATIN CALCIUM 80 MG PO TABS: 80.0000 mg | ORAL_TABLET | Freq: Every day | ORAL | 3 refills | Status: AC

## 2024-08-01 NOTE — Telephone Encounter (Signed)
 Refill sent to patients pharmacy.

## 2024-08-01 NOTE — Telephone Encounter (Signed)
 Patient needs refill on atorvastatin  80 mg he wants sent to [facility]. Does not wish to use [facility] any longer.

## 2024-08-04 ENCOUNTER — Ambulatory Visit: Attending: Cardiology | Admitting: Nurse Practitioner

## 2024-08-04 DIAGNOSIS — Z01810 Encounter for preprocedural cardiovascular examination: Secondary | ICD-10-CM | POA: Diagnosis not present

## 2024-08-04 NOTE — Progress Notes (Signed)
 Virtual Visit via Telephone Note   Because of [name] co-morbid illnesses, he is at least at moderate risk for complications without adequate follow up.  This format is felt to be most appropriate for this patient at this time.  Due to technical limitations with video connection web designer), today's appointment will be conducted as an audio only telehealth visit, and Randy Hebert verbally agreed to proceed in this manner.   All issues noted in this document were discussed and addressed.  No physical exam could be performed with this format.  Evaluation Performed:  Preoperative cardiovascular risk assessment _____________   Date:  08/04/2024   Patient ID:  Randy Hebert, DOB [birth year], MRN [id number] Patient Location:  Home Provider location:   Office  Primary Care Provider:  Bluford Jacqulyn MATSU, DO Primary Cardiologist:  Alvan Carrier, MD  Chief Complaint / Patient Profile   58 y.o. y/o male with a h/o syncope, hypertension, obesity, CAD s/p lateral STEMI 01/2015 100% D1 occlusion treated with PCI/DES x 1, residual 70% stenosis ramus, 40% mRCA, 45%dRCA, EF 45-50%, low risk myoview  07/2022, who is pending colonoscopy on date TBD and presents today for telephonic preoperative cardiovascular risk assessment.  History of Present Illness    Randy Hebert is a 58 y.o. male who presents via audio/video conferencing for a telehealth visit today.  Pt was last seen in cardiology clinic on 07/01/24 by Dr. Waddell.  At that time Randy Hebert was doing well.  The patient is now pending procedure as outlined above. Since his last visit, he  denies chest pain, shortness of breath, lower extremity edema, fatigue, palpitations, melena, hematuria, hemoptysis, diaphoresis, weakness, presyncope, syncope, orthopnea, and PND. He is active around home and is able to achieve > 4 METS Activity without concerning cardiac symptoms.    Past Medical History    Past Medical History:  Diagnosis  Date   ACE inhibitor-aggravated angioedema    Allergy     Anemia    Angio-edema    Anxiety    Arthritis    Asthma    hip replacement   Back pain    Bradycardia 04/28/2017   Bulging of cervical intervertebral disc    CAD (coronary artery disease)    lateral STEMI 02/06/2015 00% D1 occlusion treated with Promus Premier 2.5 mm x 16 mm DES, 70% ramus stenosis, 40% mid RCA stenosis, 45% distal RCA stenosis, EF 45-50%   CHF (congestive heart failure) (HCC)    COPD (chronic obstructive pulmonary disease) (HCC)    Depression    Diabetes mellitus without complication (HCC)    Difficult intubation    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   Dry eye    Dyspnea    Early satiety 09/23/2016   Fatty liver    GERD (gastroesophageal reflux disease)    HCAP (healthcare-associated pneumonia) 05/15/2017   Headache    Heart murmur    Hip pain    Hyperlipidemia    Hypertension    Lobar pneumonia 05/15/2017   Melena 08/04/2018   MI (myocardial infarction) [facility])    Myocardial infarction (HCC)    Neck pain    Non-ST elevation (NSTEMI) myocardial infarction (HCC) 04/27/2017   NSTEMI (non-ST elevated myocardial infarction) (HCC) 04/26/2017   Otitis media    Pleurisy    Pneumonia due to COVID-19 virus 10/07/2019   Rectal bleeding 11/08/2018   Right shoulder pain 03/20/2020   Sinus pause    9 sec sinus pause on telemetry after  started on coreg  after MI, avoid AV nodal blocking agent   Sleep apnea    uses a cpap   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   STEMI (ST elevation myocardial infarction) (HCC) 05/31/2015   Substance abuse (HCC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   Syncope 06/29/2017   Syncope and collapse 05/15/2017   Transaminitis 08/04/2018   Unilateral primary osteoarthritis, right hip 07/01/2016   Past Surgical History:  Procedure Laterality Date   ARTERY EXPLORATION Left 03/24/2024   Procedure: LEFT ARCH AORTOGRAM, LEFT UPPER EXTREMITY ANGIOGRAM WITH FIRST ORDER  CANNULATION, LEFT AXILLARY ANGIOPLASTY AND STENTING, EVACUATION OF LEFT AXILLARY HEMATOMA;  Surgeon: Magda Debby SAILOR, MD;  Location: [facility] OR;  Service: Vascular;  Laterality: Left;   ARTERY REPAIR Left 03/11/2024   Procedure: REPAIR LEFT AXILLARY ARTERY;  Surgeon: Serene Gaile ORN, MD;  Location: [facility] OR;  Service: Vascular;  Laterality: Left;  ARTERIAL REPAIR, LEFT AXILLARY   BIOPSY  10/09/2016   Procedure: BIOPSY;  Surgeon: Lamar CHRISTELLA Hollingshead, MD;  Location: [facility] ENDO SUITE;  Service: Endoscopy;;   BIOPSY  11/28/2019   Procedure: BIOPSY;  Surgeon: Hollingshead Lamar CHRISTELLA, MD;  [facility];  Service: Endoscopy;;   BIOPSY  06/15/2023   Procedure: BIOPSY;  Surgeon: Hollingshead Lamar CHRISTELLA, MD;  [facility];  Service: Endoscopy;;   BRONCHIAL BIOPSY  10/16/2022   Procedure: BRONCHIAL BIOPSIES;  Surgeon: Gladis Leonor CHRISTELLA, MD;  Location[facility] ENDOSCOPY;  Service: Pulmonary;;   BRONCHIAL NEEDLE ASPIRATION BIOPSY  10/16/2022   Procedure: BRONCHIAL NEEDLE ASPIRATION BIOPSIES;  Surgeon: Gladis Leonor CHRISTELLA, MD;  Location: [facility] ENDOSCOPY;  Service: Pulmonary;;   BRONCHIAL WASHINGS  10/16/2022   Procedure: BRONCHIAL WASHINGS;  Surgeon: Gladis Leonor CHRISTELLA, MD;  Location: [facility] ENDOSCOPY;  Service: Pulmonary;;   CARDIAC CATHETERIZATION N/A 02/06/2015   Procedure: Left Heart Cath and Coronary Angiography;  Surgeon: Alm ORN Clay, MD;  Location: [facility] INVASIVE CV LAB;  Service: Cardiovascular;  Laterality: N/A;   CARDIAC CATHETERIZATION N/A 02/06/2015   Procedure: Coronary Stent Intervention;  Surgeon: Alm ORN Clay, MD;  Location: [facility] INVASIVE CV LAB;  Service: Cardiovascular;  Laterality: N/A;   COLONOSCOPY WITH PROPOFOL  N/A 10/09/2016   Sigmoid and descending colon diverticulosis, four 4-6 mm polyps in sigmoid, one 4 mm polyp in descending. Tubular adenomas and hyperplastic. 5 year surveillance.    COLONOSCOPY WITH PROPOFOL  N/A 11/24/2016   Sigmoid and descending colon diverticulosis, four 4-6 mm polyps in sigmoid,  one 4 mm polyp in descending. Tubular adenomas and hyperplastic. 5 year surveillance.    COLONOSCOPY WITH PROPOFOL  N/A 10/18/2021   Carver: nonbleeding internal hemorrhoids, small and large mouth diverticula found in the sigmoid, descending, transverse colon.  A 9 mm sessile polyp was found in the ascending colon that was removed.  4 sessile polyps found in the sigmoid, descending, transverse colon 3 to 5 mm in size, examining otherwise.  Path revealed tubular adenomas.  Repeat due in 5 years for surveillance.   CORONARY ANGIOPLASTY WITH STENT PLACEMENT  01/2015   CORONARY STENT INTERVENTION N/A 04/27/2017   Procedure: CORONARY STENT INTERVENTION;  Surgeon: Mady Bruckner, MD;  [facility]: [facility] INVASIVE CV LAB;  Service: Cardiovascular;  Laterality: N/A;   ELECTROPHYSIOLOGY STUDY N/A 06/29/2017   Procedure: ELECTROPHYSIOLOGY STUDY;  Surgeon: Waddell Danelle ORN, MD;  [facility]: [facility] INVASIVE CV LAB;  Service: Cardiovascular;  Laterality: N/A;   ESOPHAGOGASTRODUODENOSCOPY (EGD) WITH PROPOFOL  N/A 10/09/2016   Dr. Hollingshead: LA grade a esophagitis.  Barrett's esophagus, biopsy-proven.  Small hiatal  hernia.  EGD February 2019   ESOPHAGOGASTRODUODENOSCOPY (EGD) WITH PROPOFOL  N/A 11/28/2019    salmon-colored esophageal mucosa (Barrett's) small hiatal hernia, portal hypertensive gastropathy, normal duodenum, 3 year surveillance   ESOPHAGOGASTRODUODENOSCOPY (EGD) WITH PROPOFOL  N/A 09/06/2021   three columns of grade 1 varices in distal esophagus, no stigmata of bleeding or red wale signs. Small hiatal hernia. Mild portal gastropathy. Normal duodenum. Repeat upper endoscopy in 1 year for surveillance.   ESOPHAGOGASTRODUODENOSCOPY (EGD) WITH PROPOFOL  N/A 06/15/2023   Procedure: ESOPHAGOGASTRODUODENOSCOPY (EGD) WITH PROPOFOL ;  Surgeon: Shaaron Lamar HERO, MD;  [facility];  Service: Endoscopy;  Laterality: N/A;  830am, asa 3   FIDUCIAL MARKER PLACEMENT  10/16/2022   Procedure: FIDUCIAL MARKER PLACEMENT;  Surgeon:  Gladis Leonor HERO, MD;  Location: [facility] ENDOSCOPY;  Service: Pulmonary;;   INCISION / DRAINAGE HAND / FINGER     LEFT HEART CATH AND CORONARY ANGIOGRAPHY N/A 04/27/2017   Procedure: LEFT HEART CATH AND CORONARY ANGIOGRAPHY;  Surgeon: Mady Bruckner, MD;  [facility] INVASIVE CV LAB;  Service: Cardiovascular;  Laterality: N/A;   LOOP RECORDER INSERTION  06/29/2017   Procedure: Loop Recorder Insertion;  Surgeon: Waddell Danelle ORN, MD;  Location: [facility] INVASIVE CV LAB;  Service: Cardiovascular;;   POLYPECTOMY  11/24/2016   Procedure: POLYPECTOMY;  Surgeon: Lamar HERO Shaaron, MD;  [facility];  Service: Endoscopy;;  descending and sigmoid   POLYPECTOMY  10/18/2021   Procedure: POLYPECTOMY;  Surgeon: Cindie Carlin POUR, DO;  [facility];  Service: Endoscopy;;   TOTAL HIP ARTHROPLASTY Right 07/01/2016   TOTAL HIP ARTHROPLASTY Right 07/01/2016   Procedure: RIGHT TOTAL HIP ARTHROPLASTY ANTERIOR APPROACH;  Surgeon: Bruckner CINDERELLA Poli, MD;  Location[facility] OR;  Service: Orthopedics;  Laterality: Right;   ULTRASOUND GUIDANCE FOR VASCULAR ACCESS Left 03/24/2024   Procedure: ULTRASOUND GUIDANCE, FOR VASCULAR ACCESS;  Surgeon: Magda Debby SAILOR, MD;  Location: [facility] OR;  Service: Vascular;  Laterality: Left;    Allergies  Allergies  Allergen Reactions   Coreg  [Carvedilol ] Other (See Comments)    Sinus pause on telemetry >3 seconds. Longest one 9 sec. No AV nodal agent   Zestril  [Lisinopril ] Anaphylaxis, Shortness Of Breath and Swelling    Angioedema, required intubation and mechanical ventilation   Chantix  [Varenicline ] Other (See Comments)    Nightmares Insomnia    Amoxil  [Amoxicillin ] Nausea Only    Home Medications    Prior to Admission medications   Medication Sig Start Date End Date Taking? Authorizing Provider  acetaminophen  (TYLENOL ) 500 MG tablet Take 2 tablets (1,000 mg total) by mouth every 8 (eight) hours as needed for mild pain or fever. 09/06/21   Jens Durand, MD   amLODipine  (NORVASC ) 10 MG tablet TAKE ONE TABLET BY MOUTH DAILY 07/05/24   Branch, Jonathan F, MD  Ascorbic Acid  (VITAMIN C  PO) Take 1 tablet by mouth daily.    [provider]  aspirin  EC 81 MG tablet Take 1 tablet (81 mg total) by mouth daily. Swallow whole. 03/03/24   Maree, Pratik D, DO  atorvastatin  (LIPITOR ) 80 MG tablet Take 1 tablet (80 mg total) by mouth daily. 08/01/24   Alvan Dorn FALCON, MD  Cholecalciferol  (VITAMIN D -3 PO) Take 1 capsule by mouth daily.    [provider]  citalopram  (CELEXA ) 20 MG tablet Take 1 tablet (20 mg total) by mouth daily. 07/04/24   Cook, Jayce G, DO  clopidogrel  (PLAVIX ) 75 MG tablet Take 1 tablet (75 mg total) by mouth daily. 06/02/24   Branch,  Dorn FALCON, MD  dapagliflozin  propanediol (FARXIGA ) 10 MG TABS tablet Take 1 tablet (10 mg total) by mouth daily. 05/27/24   Nida, Gebreselassie W, MD  Dulaglutide  (TRULICITY ) 1.5 MG/0.5ML SOAJ Inject 1.5 mg into the skin once a week. 05/27/24   Lenis Ethelle ORN, MD  ezetimibe  (ZETIA ) 10 MG tablet TAKE ONE TABLET BY MOUTH DAILY 05/27/24   Alvan Dorn FALCON, MD  Ferrous Sulfate  (IRON PO) Take 1 tablet by mouth daily.    [provider]  furosemide  (LASIX ) 40 MG tablet Take 1 tablet (40 mg total) by mouth daily as needed (excessive swelling). 06/02/24   Alvan Dorn FALCON, MD  MAGNESIUM  PO Take 1 tablet by mouth 2 (two) times daily.    [provider]  nitroGLYCERIN  (NITROSTAT ) 0.4 MG SL tablet Place 1 tablet (0.4 mg total) under the tongue every 5 (five) minutes x 3 doses as needed (if no relief after 3rd dose, proceed to ED or call 911). 06/02/24   Alvan Dorn FALCON, MD  pantoprazole  (PROTONIX ) 40 MG tablet TAKE (1) TABLET BY MOUTH TWICE DAILY BEFORE A MEAL. 01/11/24   Rourk, Lamar HERO, MD  pregabalin (LYRICA) 75 MG capsule Take 75 mg by mouth daily. 05/20/24 05/20/25  [provider]  RESTASIS  0.05 % ophthalmic emulsion Place 1 drop into both eyes 2 (two) times daily. 02/26/24    [provider]  SYMBICORT  160-4.5 MCG/ACT inhaler INHALE 2 PUFFS INTO THE LUNGS FIRST THING IN THE MORNING AND 2 PUFFS ABOUT 12 HOURS LATER. 12/17/23   Cook, Jayce G, DO    Physical Exam    Vital Signs:  Randy Hebert does not have vital signs available for review today.  Given telephonic nature of communication, physical exam is limited. AAOx3. NAD. Normal affect.  Speech and respirations are unlabored.  Accessory Clinical Findings    None  Assessment & Plan    1.  Preoperative Cardiovascular Risk Assessment: According to the Revised Cardiac Risk Index (RCRI), his Perioperative Risk of Major Cardiac Event is (%): 11. His Functional Capacity in METs is: 6.05 according to the Duke Activity Status Index (DASI). The patient is doing well from a cardiac perspective. Therefore, based on ACC/AHA guidelines, the patient would be at acceptable risk for the planned procedure without further cardiovascular testing.   The patient was advised that if he develops new symptoms prior to surgery to contact our office to arrange for a follow-up visit, and he verbalized understanding.  He may hold Plavix  for 5 days prior to procedure. Please resume Plavix  as soon as possible postprocedure, at the discretion of the surgeon. Ideally aspirin  should be continued without interruption.  A copy of this note will be routed to requesting surgeon.  Time:   Today, I have spent 10 minutes with the patient with telehealth technology discussing medical history, symptoms, and management plan.     Randy EMERSON Bane, NP-C  08/04/2024, 9:43 AM [address], [address] [place], [place] [address] Office [phone] Fax [phone]

## 2024-08-10 NOTE — Telephone Encounter (Signed)
 Pt had telephone visit on 08/04/24.

## 2024-08-10 NOTE — Telephone Encounter (Signed)
 Noted. Will schedule once get providers schedule for Jan

## 2024-08-11 ENCOUNTER — Ambulatory Visit: Attending: Family Medicine | Admitting: Physical Therapy

## 2024-08-12 MED ORDER — PEG 3350-KCL-NA BICARB-NACL 420 G PO SOLR: 4000.0000 mL | Freq: Once | ORAL | 0 refills | Status: AC

## 2024-08-12 NOTE — Telephone Encounter (Signed)
 LMOVM to return call.

## 2024-08-12 NOTE — Addendum Note (Signed)
 Addended by: JEANELL GRAEME RAMAN on: 08/12/2024 10:50 AM   Modules accepted: Orders

## 2024-08-12 NOTE — Telephone Encounter (Signed)
 Spoke with pt. He has been scheduled for 09/07/24. Aware will mail instructions and send rx for prep to pharmacy.

## 2024-08-16 ENCOUNTER — Ambulatory Visit: Attending: Family Medicine | Admitting: Physical Therapy

## 2024-08-16 ENCOUNTER — Encounter: Payer: Self-pay | Admitting: Physical Therapy

## 2024-08-16 DIAGNOSIS — M6281 Muscle weakness (generalized): Secondary | ICD-10-CM | POA: Diagnosis present

## 2024-08-16 DIAGNOSIS — M25512 Pain in left shoulder: Secondary | ICD-10-CM | POA: Insufficient documentation

## 2024-08-16 NOTE — Therapy (Signed)
 OUTPATIENT PHYSICAL THERAPY TREATMENT   Patient Name: Randy Hebert MRN: [id number] DOB:05-09-1966, 58 y.o., male Today's Date: 08/16/2024  END OF SESSION:  PT End of Session - 08/16/24 0804     Visit Number 15    Number of Visits 24    Date for Recertification  10/11/24    Progress Note Due on Visit 20    PT Start Time 0800    PT Stop Time 0840    PT Time Calculation (min) 40 min    Activity Tolerance Patient tolerated treatment well    Behavior During Therapy [facility] for tasks assessed/performed             Past Medical History:  Diagnosis Date   ACE inhibitor-aggravated angioedema    Allergy     Anemia    Angio-edema    Anxiety    Arthritis    Asthma    hip replacement   Back pain    Bradycardia 04/28/2017   Bulging of cervical intervertebral disc    CAD (coronary artery disease)    lateral STEMI 02/06/2015 00% D1 occlusion treated with Promus Premier 2.5 mm x 16 mm DES, 70% ramus stenosis, 40% mid RCA stenosis, 45% distal RCA stenosis, EF 45-50%   CHF (congestive heart failure) (HCC)    COPD (chronic obstructive pulmonary disease) (HCC)    Depression    Diabetes mellitus without complication (HCC)    Difficult intubation    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   Dry eye    Dyspnea    Early satiety 09/23/2016   Fatty liver    GERD (gastroesophageal reflux disease)    HCAP (healthcare-associated pneumonia) 05/15/2017   Headache    Heart murmur    Hip pain    Hyperlipidemia    Hypertension    Lobar pneumonia 05/15/2017   Melena 08/04/2018   MI (myocardial infarction) (HCC)    Myocardial infarction (HCC)    Neck pain    Non-ST elevation (NSTEMI) myocardial infarction (HCC) 04/27/2017   NSTEMI (non-ST elevated myocardial infarction) (HCC) 04/26/2017   Otitis media    Pleurisy    Pneumonia due to COVID-19 virus 10/07/2019   Rectal bleeding 11/08/2018   Right shoulder pain 03/20/2020   Sinus pause    9 sec sinus pause on telemetry after  started on coreg  after MI, avoid AV nodal blocking agent   Sleep apnea    uses a cpap   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   STEMI (ST elevation myocardial infarction) (HCC) 05/31/2015   Substance abuse (HCC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   Syncope 06/29/2017   Syncope and collapse 05/15/2017   Transaminitis 08/04/2018   Unilateral primary osteoarthritis, right hip 07/01/2016   Past Surgical History:  Procedure Laterality Date   ARTERY EXPLORATION Left 03/24/2024   Procedure: LEFT ARCH AORTOGRAM, LEFT UPPER EXTREMITY ANGIOGRAM WITH FIRST ORDER CANNULATION, LEFT AXILLARY ANGIOPLASTY AND STENTING, EVACUATION OF LEFT AXILLARY HEMATOMA;  Surgeon: Magda Debby SAILOR, MD;  Location: [facility] OR;  Service: Vascular;  Laterality: Left;   ARTERY REPAIR Left 03/11/2024   Procedure: REPAIR LEFT AXILLARY ARTERY;  Surgeon: Serene Gaile ORN, MD;  Location: [facility] OR;  Service: Vascular;  Laterality: Left;  ARTERIAL REPAIR, LEFT AXILLARY   BIOPSY  10/09/2016   Procedure: BIOPSY;  Surgeon: Lamar CHRISTELLA Hollingshead, MD;  Location: [facility] END[facility]ITE;  Service: Endoscopy;;   BIOPSY  11/28/2019   Procedure: BIOPSY;  Surgeon: Hollingshead Lamar CHRISTELLA, MD;  Location: [facility];  Service: Endoscopy;;   BIOPSY  06/15/2023   Procedure: BIOPSY;  Surgeon: Shaaron Lamar HERO, MD;  Location: [facility];  Service: Endoscopy;;   BRONCHIAL BIOPSY  10/16/2022   Procedure: BRONCHIAL BIOPSIES;  Surgeon: Gladis Leonor HERO, MD;  Location: [facility] ENDOSCOPY;  Service: Pulmonary;;   BRONCHIAL NEEDLE ASPIRATION BIOPSY  10/16/2022   Procedure: BRONCHIAL NEEDLE ASPIRATION BIOPSIES;  Surgeon: Gladis Leonor HERO, MD;  Location: [facility] ENDOSCOPY;  Service: Pulmonary;;   BRONCHIAL WASHINGS  10/16/2022   Procedure: BRONCHIAL WASHINGS;  Surgeon: Gladis Leonor HERO, MD;  Location: [facility] ENDOSCOPY;  Service: Pulmonary;;   CARDIAC CATHETERIZATION N/A 02/06/2015   Procedure: Left Heart Cath and Coronary Angiography;  Surgeon: Alm LELON Clay, MD;  Location: [facility] INVASIVE CV LAB;   Service: Cardiovascular;  Laterality: N/A;   CARDIAC CATHETERIZATION N/A 02/06/2015   Procedure: Coronary Stent Intervention;  Surgeon: Alm LELON Clay, MD;  Location: [facility] INVASIVE CV LAB;  Service: Cardiovascular;  Laterality: N/A;   COLONOSCOPY WITH PROPOFOL  N/A 10/09/2016   Sigmoid and descending colon diverticulosis, four 4-6 mm polyps in sigmoid, one 4 mm polyp in descending. Tubular adenomas and hyperplastic. 5 year surveillance.    COLONOSCOPY WITH PROPOFOL  N/A 11/24/2016   Sigmoid and descending colon diverticulosis, four 4-6 mm polyps in sigmoid, one 4 mm polyp in descending. Tubular adenomas and hyperplastic. 5 year surveillance.    COLONOSCOPY WITH PROPOFOL  N/A 10/18/2021   Carver: nonbleeding internal hemorrhoids, small and large mouth diverticula found in the sigmoid, descending, transverse colon.  A 9 mm sessile polyp was found in the ascending colon that was removed.  4 sessile polyps found in the sigmoid, descending, transverse colon 3 to 5 mm in size, examining otherwise.  Path revealed tubular adenomas.  Repeat due in 5 years for surveillance.   CORONARY ANGIOPLASTY WITH STENT PLACEMENT  01/2015   CORONARY STENT INTERVENTION N/A 04/27/2017   Procedure: CORONARY STENT INTERVENTION;  Surgeon: Mady Bruckner, MD;  Location: [facility] INVASIVE CV LAB;  Service: Cardiovascular;  Laterality: N/A;   ELECTROPHYSIOLOGY STUDY N/A 06/29/2017   Procedure: ELECTROPHYSIOLOGY STUDY;  Surgeon: Waddell Danelle LELON, MD;  Location: [facility] INVASIVE CV LAB;  Service: Cardiovascular;  Laterality: N/A;   ESOPHAGOGASTRODUODENOSCOPY (EGD) WITH PROPOFOL  N/A 10/09/2016   Dr. Shaaron: LA grade a esophagitis.  Barrett's esophagus, biopsy-proven.  Small hiatal hernia.  EGD February 2019   ESOPHAGOGASTRODUODENOSCOPY (EGD) WITH PROPOFOL  N/A 11/28/2019    salmon-colored esophageal mucosa (Barrett's) small hiatal hernia, portal hypertensive gastropathy, normal duodenum, 3 year surveillance   ESOPHAGOGASTRODUODENOSCOPY (EGD) WITH  PROPOFOL  N/A 09/06/2021   three columns of grade 1 varices in distal esophagus, no stigmata of bleeding or red wale signs. Small hiatal hernia. Mild portal gastropathy. Normal duodenum. Repeat upper endoscopy in 1 year for surveillance.   ESOPHAGOGASTRODUODENOSCOPY (EGD) WITH PROPOFOL  N/A 06/15/2023   Procedure: ESOPHAGOGASTRODUODENOSCOPY (EGD) WITH PROPOFOL ;  Surgeon: Shaaron Lamar HERO, MD;  [facility];  Service: Endoscopy;  Laterality: N/A;  830am, asa 3   FIDUCIAL MARKER PLACEMENT  10/16/2022   Procedure: FIDUCIAL MARKER PLACEMENT;  Surgeon: Gladis Leonor HERO, MD;  Location: [facility] ENDOSCOPY;  Service: Pulmonary;;   INCISION / DRAINAGE HAND / FINGER     LEFT HEART CATH AND CORONARY ANGIOGRAPHY N/A 04/27/2017   Procedure: LEFT HEART CATH AND CORONARY ANGIOGRAPHY;  Surgeon: Mady Bruckner, MD;  Location: [facility] INVASIVE CV LAB;  Service: Cardiovascular;  Laterality: N/A;   LOOP RECORDER INSERTION  06/29/2017   Procedure: Loop Recorder Insertion;  Surgeon: Waddell Danelle LELON, MD;  Location:  [facility] INVASIVE CV LAB;  Service: Cardiovascular;;   POLYPECTOMY  11/24/2016   Procedure: POLYPECTOMY;  Surgeon: Lamar CHRISTELLA Hollingshead, MD;  [facility]: [facility];  Service: Endoscopy;;  descending and sigmoid   POLYPECTOMY  10/18/2021   Procedure: POLYPECTOMY;  Surgeon: Cindie Carlin POUR, DO;  Location: [facility];  Service: Endoscopy;;   TOTAL HIP ARTHROPLASTY Right 07/01/2016   TOTAL HIP ARTHROPLASTY Right 07/01/2016   Procedure: RIGHT TOTAL HIP ARTHROPLASTY ANTERIOR APPROACH;  Surgeon: Lonni CINDERELLA Poli, MD;  Location: [facility] OR;  Service: Orthopedics;  Laterality: Right;   ULTRASOUND GUIDANCE FOR VASCULAR ACCESS Left 03/24/2024   Procedure: ULTRASOUND GUIDANCE, FOR VASCULAR ACCESS;  Surgeon: Magda Debby SAILOR, MD;  Location: [facility] OR;  Service: Vascular;  Laterality: Left;   Patient Active Problem List   Diagnosis Date Noted   Pain of left upper extremity 04/24/2024   Acute postoperative anemia due to greater  than expected blood loss 03/26/2024   Obesity, class 1 03/26/2024   Aneurysm of axillary artery 03/24/2024   Axillary artery injury 03/12/2024   Acute CVA (cerebrovascular accident) (HCC) 03/02/2024   Essential hypertension 03/02/2024   Dyslipidemia 03/02/2024   Type 2 diabetes mellitus with hyperlipidemia (HCC) 03/02/2024   Erectile dysfunction 02/24/2023   Chronic eczematous otitis externa of both ears 02/05/2023   Tobacco abuse 12/25/2022   Malignant neoplasm of upper lobe of right lung (HCC) 09/25/2022   Cervical spondylosis with myelopathy 08/01/2022   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   DDD (degenerative disc disease), cervical 05/21/2022   Osteoarthritis of both hands 05/19/2022   History of GI bleed 11/13/2021   Insomnia 09/13/2021   Cirrhosis of liver (HCC) 11/08/2018   Barrett's esophagus 08/04/2018   Vitamin D  deficiency 06/30/2018   Alcohol  abuse 06/30/2018   DM type 2 causing vascular disease (HCC) 08/17/2017   Chronic combined systolic and diastolic CHF (congestive heart failure) (HCC) 05/15/2017   OSA (obstructive sleep apnea) 04/28/2017   Depression with anxiety 04/15/2017   GERD (gastroesophageal reflux disease) 09/23/2016   Asthmatic bronchitis , chronic (HCC) 08/27/2015   Mixed hyperlipidemia 08/27/2015   Class 1 obesity due to excess calories with serious comorbidity and body mass index (BMI) of 32.0 to 32.9 in adult 08/27/2015   CAD (coronary artery disease) 05/31/2015   Essential hypertension, benign 02/09/2015    PCP: Cook, Jayce G, DO   REFERRING PROVIDER: Noralee Elidia Sieving*   REFERRING DIAG: left axillary pseudoaneurysm   Rationale for Evaluation and Treatment:  Rehabiliation  THERAPY DIAG:  Muscle weakness (generalized)  Acute pain of left shoulder  ONSET DATE: 03/11/24   SUBJECTIVE:  SUBJECTIVE STATEMENT: He has been sick so has missed some PT. He is feeling better now, not having much pain but his left arm is numb still. He can now open doors better and is improving the function of his left arm some but he still can't raise his arm over shoulder height without assistance.     PERTINENT HISTORY:  past medical history of diabetes mellitus type 2, hypertension, CAD, liver cirrhosis, anxiety, depression, OSA, CVA w/ left-sided weakness who had a recent hospitalization 7/11 to 03/19/24 post traumatic left shoulder dislocation, complicated with left axillary hematoma with left axillary pseudoaneurysm. On 07/12 he underwent open exposure and repair of the left axillary pseudoaneurysm and evacuation of left axillary hematoma.   PAIN:  NPRS scale: 1/10 Pain location:left shoulder/axillary, left forearm and wrist Pain description: constant, achy, sharp Aggravating factors: any use of this left arm. Relieving factors: rest, meds   PRECAUTIONS: ,  Shoulder  RED FLAGS: None   WEIGHT BEARING RESTRICTIONS:  Not anymore  FALLS:  Has patient fallen in last 6 months? Yes. Number of falls 1   OCCUPATION:  disabled  PLOF:  Independent with basic ADLs  PATIENT GOALS:  Reduce pain and get his arm back to normal  OBJECTIVE:  Note: Objective measures were completed at Evaluation unless otherwise noted.  DIAGNOSTIC FINDINGS:  Chest CT IMPRESSION: 1. Enlarging left axillary hematoma with arterial contrast extravasation from the posterior wall of the left axillary artery into a 1.5 x 1.8 cm pseudoaneurysm, with additional 1.8 x 1.1 cm pocket of contrast more posteriorly within the collection. 2. Increased heterogeneous hematoma in the left subscapularis region measuring 7.3 x 4.8 cm, but no focal contrast is seen within this. 3. Emphysema. 4. Right apical fiducial markers and post XRT changes, with treated nodule just above the fiducial  markers today measuring 8 mm, previously 9 mm. 5. Aortic and coronary artery atherosclerosis.  Right aortic arch. 6. Enlarged pulmonary trunk 3.3 cm indicating arterial hypertension. 7. Cirrhosis and splenomegaly.  Prominent patent portal vein 1.7 cm. 8. Critical Value/emergent results were called by telephone at the time of interpretation on 03/24/2024 at 5:18 am to provider [facility] , who verbally acknowledged these results.  Shoulder XR IMPRESSION: No acute bony abnormality.   Postoperative changes in the left axilla.  No imaging for left elbow/wrist  06/03/24 EMG & NCS Examination  Impression: 1. This is an abnormal electrodiagnostic study of the left upper extremity 2. There is electrodiagnostic evidence of a diffuse, subacute brachial plexopathy affecting the upper, middle, and lower trunks of the left upper extremity with widespread denervation changes indicating ongoing axonal injury   PATIENT SURVEYS:  Patient-Specific Activity Scoring Scheme  0 represents unable to perform. 10 represents able to perform at prior level. 0 1 2 3 4 5 6 7 8 9  10 (Date and Score)   Activity Eval  06/21/24  1. Raising left arm  0 0   2. Putting on and off shirt 1  8  3.     4.    5.    Score 0.5/10 4/10   Total score = sum of the activity scores/number of activities Minimum detectable change (90%CI) for average score = 2 points Minimum detectable change (90%CI) for single activity score = 3 points     EDEMA:  Yes: moderate edema noted in left upper extremity from shoulder to hand  Observation:  2 incisions noted, one at proximal chest, one at axilla, both incisions closed with stitches and appear  closed without any drainage or signs of infection at the moment. Dressing was removed and changed for him at eval.     UPPER EXTREMITY ROM:  AROM/Passive ROM Left eval Left 04/13/24 Left 05/24/24 Left 06/21/24 Left 08/16/24  Shoulder flexion /50 /120 30/150 40/ 40/WFL   Shoulder extension       Shoulder abduction /50 /100 20/140 22/ 22/WFL  Shoulder adduction       Shoulder extension       Shoulder internal rotation /50    L2 behind back in standing /  Shoulder external rotation /40 /70   To ear in standing/  Elbow flexion       Elbow extension       Wrist flexion       Wrist extension       Wrist ulnar deviation       Wrist radial deviation       Wrist pronation       Wrist supination        (Blank rows = not tested)   UPPER EXTREMITY MMT:  MMT Left eval Left 05/24/24 Left 06/21/24 Left 07/19/24 Left 08/16/24  Shoulder flexion 1 2 2 2 2   Shoulder extension       Shoulder abduction 1 2 2 2 2   Shoulder adduction       Shoulder extension       Shoulder internal rotation 1 2 4 4 4   Shoulder external rotation 1 2 3  3+ 3+  Middle trapezius       Lower trapezius       Elbow flexion 1 3 4 4 4   Elbow extension 2 3 4 4 4   Wrist flexion       Wrist extension       Wrist ulnar deviation       Wrist radial deviation       Wrist pronation       Wrist supination       Grip strength     3   (Blank rows = not tested)                                                                                                                              TREATMENT DATE:  08/16/24 Theractivity (ROM and strength for reaching, push/pull, gripping) UBE L2 X 10 min , able to maintain grip the whole 10 min now Wall ladder X 10 reps Standing p ball roll outs on mat table for AAROM flexion, abduction, circles CW & CCW X 10 reps each for reaching Gripping ace bandage X 25 Standing scapular retractions X15 with red band wrapped around hand Standing bicep curls 2X10  with red band around hand Standing chest press red X15 Standing IR red 2 X 10 Standing shoulder adduction red 2X10 Standing shoulder extension red2 X10 Standing ER red 2X10 Supine chest press AROM X 15 with slow eccentric Supine shoulder flexion AAROM with cane X 10 with slow  eccentrics Supine  shoulder abduction AAROM with cane X 10 with slow eccentrics Pulleys X 15 reps flexion with slow eccentrics  07/19/24 Theractivity UBE L1 X 10 min , able to maintain grip the whole 10 min now Wall ladder X 10 reps Standing p ball roll outs on mat table for AAROM flexion, abduction, circles CW & CCW X 10 reps each for reaching Gripping ace bandage X 25 Standing scapular retractions X15 with red band wrapped around hand Standing bicep curls 2X10  with red band around hand Standing chest press red X15 Standing IR red X 15  Standing ER red X 15 Blaze pods 3pods, using UE to turn off working on reaching horizontal abduction/adduction and hand control, pods placed on high mat, performed 30 sec X 3 with 30 sec rest in between Blaze pods 3 pods, using UE to turn off working on reaching flexion/extension and hand control, pods placed on high mat, performed 30 sec X 3 with 30 sec rest in between   PATIENT EDUCATION: Education details: HEP, PT plan of care, selfcare Person educated: Patient Education method: Explanation, Demonstration, Verbal cues, and Handouts Education comprehension: verbalized understanding, further education recommended   HOME EXERCISE PROGRAM: Access Code: [email or web address] URL: [email or web address] Date: 03/31/2024 Prepared by: Redell Moose  Exercises - Circular Shoulder Pendulum with Table Support  - 2-3 x daily - 7 x weekly - 1 sets - 15 reps - Seated Shoulder Abduction Towel Slide at Table Top  - 2-3 x daily - 6 x weekly - 1 sets - 10 reps - 5 sec hold - Seated Shoulder Flexion Towel Slide at Table Top  - 2-3 x daily - 6 x weekly - 1 sets - 10 reps - 5 sec hold - Seated Shoulder External Rotation PROM on Table  - 2-3 x daily - 6 x weekly - 1 sets - 10 reps - 5 sec hold - Seated Gripping Towel  - 2-3 x daily - 6 x weekly - 1 sets - 20 reps - Standing Elbow Flexion Extension AROM  - 2-3 x daily - 6 x weekly - 1 sets - 10 reps  ASSESSMENT: His grip has  improved as well as some AROM for strength and shoulder rotation however shoulder flexion and abduction remains limited due to lack of deltoid strength and PT is working to develop compensatory strategies.   OBJECTIVE IMPAIRMENTS: decreased activity tolerance for ADL's, difficulty walking, decreased balance, decreased endurance, decreased mobility, decreased ROM, decreased strength, impaired flexibility, impaired UE use, and pain.  ACTIVITY LIMITATIONS: bending, lifting, carry, reaching, locomotion, cleaning, community activity, driving,  PERSONAL FACTORS: see above PMH  also affecting patient's functional outcome.  REHAB POTENTIAL: Good  CLINICAL DECISION MAKING: Evolving/moderate complexity  EVALUATION COMPLEXITY: Moderate    GOALS: Short term PT Goals Target date: 04/28/2024   Pt will be I and compliant with HEP. Baseline:  Goal status: MET 04/18/24 Pt will decrease pain by 25% overall Baseline: Goal status: MET 04/18/24  Long term PT goals Target date:06/23/2024  Pt will improve right shoulder and elbow AROM to [facility] to improve functional mobility Baseline: Goal status: ongoing 08/16/24 Pt will improve  right arm strength to at least 4+/5 MMT to improve functional strength Baseline: Goal status: ongoing 08/16/24 Pt will improve Patient specific functional scale (PSFS) to at least 7/10 to show improved function level Baseline: Goal status: ongoing 08/16/24 Pt will reduce pain to overall less than 3/10 with usual activity Baseline: Goal status: ongoing 08/16/24 PLAN: PT FREQUENCY: 1-3 times per  week   PT DURATION: 6-8 weeks  PLANNED INTERVENTIONS (unless contraindicated):  97110-Therapeutic exercises, 97530- Therapeutic activity, [id number]- Neuromuscular re-education, 97535- Self Care, [id number]- Manual therapy, [id number]- Electrical stimulation (unattended), 97016- Vasopneumatic device, [id number]- Ultrasound, and 97033- Ionotophoresis 4mg /ml Dexamethasone   PLAN FOR NEXT SESSION:  progress strength and function as tolerated, compensatory strategies for deltoid  Redell JONELLE Moose, PT,DPT 08/16/2024, 8:04 AM

## 2024-08-22 ENCOUNTER — Ambulatory Visit: Admitting: Physical Therapy

## 2024-08-22 ENCOUNTER — Encounter: Payer: Self-pay | Admitting: Physical Therapy

## 2024-08-22 DIAGNOSIS — M25512 Pain in left shoulder: Secondary | ICD-10-CM

## 2024-08-22 DIAGNOSIS — M6281 Muscle weakness (generalized): Secondary | ICD-10-CM | POA: Diagnosis not present

## 2024-08-22 NOTE — Therapy (Addendum)
 " OUTPATIENT PHYSICAL THERAPY TREATMENT   Patient Name: Randy Hebert MRN: [id number] DOB:11/13/1965, 58 y.o., male Today's Date: 08/22/2024  END OF SESSION:  PT End of Session - 08/22/24 0802     Visit Number 16    Number of Visits 24    Date for Recertification  10/11/24    Progress Note Due on Visit 20    PT Start Time 0800    PT Stop Time 0840    PT Time Calculation (min) 40 min    Activity Tolerance Patient tolerated treatment well    Behavior During Therapy [facility] for tasks assessed/performed             Past Medical History:  Diagnosis Date   ACE inhibitor-aggravated angioedema    Allergy     Anemia    Angio-edema    Anxiety    Arthritis    Asthma    hip replacement   Back pain    Bradycardia 04/28/2017   Bulging of cervical intervertebral disc    CAD (coronary artery disease)    lateral STEMI 02/06/2015 00% D1 occlusion treated with Promus Premier 2.5 mm x 16 mm DES, 70% ramus stenosis, 40% mid RCA stenosis, 45% distal RCA stenosis, EF 45-50%   CHF (congestive heart failure) (HCC)    COPD (chronic obstructive pulmonary disease) (HCC)    Depression    Diabetes mellitus without complication (HCC)    Difficult intubation    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   Dry eye    Dyspnea    Early satiety 09/23/2016   Fatty liver    GERD (gastroesophageal reflux disease)    HCAP (healthcare-associated pneumonia) 05/15/2017   Headache    Heart murmur    Hip pain    Hyperlipidemia    Hypertension    Lobar pneumonia 05/15/2017   Melena 08/04/2018   MI (myocardial infarction) (HCC)    Myocardial infarction (HCC)    Neck pain    Non-ST elevation (NSTEMI) myocardial infarction (HCC) 04/27/2017   NSTEMI (non-ST elevated myocardial infarction) (HCC) 04/26/2017   Otitis media    Pleurisy    Pneumonia due to COVID-19 virus 10/07/2019   Rectal bleeding 11/08/2018   Right shoulder pain 03/20/2020   Sinus pause    9 sec sinus pause on telemetry after  started on coreg  after MI, avoid AV nodal blocking agent   Sleep apnea    uses a cpap   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   STEMI (ST elevation myocardial infarction) (HCC) 05/31/2015   Substance abuse (HCC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   Syncope 06/29/2017   Syncope and collapse 05/15/2017   Transaminitis 08/04/2018   Unilateral primary osteoarthritis, right hip 07/01/2016   Past Surgical History:  Procedure Laterality Date   ARTERY EXPLORATION Left 03/24/2024   Procedure: LEFT ARCH AORTOGRAM, LEFT UPPER EXTREMITY ANGIOGRAM WITH FIRST ORDER CANNULATION, LEFT AXILLARY ANGIOPLASTY AND STENTING, EVACUATION OF LEFT AXILLARY HEMATOMA;  Surgeon: Magda Debby SAILOR, MD;  Location: [facility] OR;  Service: Vascular;  Laterality: Left;   ARTERY REPAIR Left 03/11/2024   Procedure: REPAIR LEFT AXILLARY ARTERY;  Surgeon: Serene Gaile ORN, MD;  Location: [facility] OR;  Service: Vascular;  Laterality: Left;  ARTERIAL REPAIR, LEFT AXILLARY   BIOPSY  10/09/2016   Procedure: BIOPSY;  Surgeon: Lamar CHRISTELLA Hollingshead, MD;  Location: [facility] END[facility] SUITE;  Service: Endoscopy;;   BIOPSY  11/28/2019   Procedure: BIOPSY;  Surgeon: Hollingshead Lamar CHRISTELLA, MD;  Location: [facility];  Service: Endoscopy;;   BIOPSY  06/15/2023   Procedure: BIOPSY;  Surgeon: Shaaron Lamar HERO, MD;  Location: [facility];  Service: Endoscopy;;   BRONCHIAL BIOPSY  10/16/2022   Procedure: BRONCHIAL BIOPSIES;  Surgeon: Gladis Leonor HERO, MD;  Location: [facility] ENDOSCOPY;  Service: Pulmonary;;   BRONCHIAL NEEDLE ASPIRATION BIOPSY  10/16/2022   Procedure: BRONCHIAL NEEDLE ASPIRATION BIOPSIES;  Surgeon: Gladis Leonor HERO, MD;  Location: [facility] ENDOSCOPY;  Service: Pulmonary;;   BRONCHIAL WASHINGS  10/16/2022   Procedure: BRONCHIAL WASHINGS;  Surgeon: Gladis Leonor HERO, MD;  Location: [facility] ENDOSCOPY;  Service: Pulmonary;;   CARDIAC CATHETERIZATION N/A 02/06/2015   Procedure: Left Heart Cath and Coronary Angiography;  Surgeon: Alm LELON Clay, MD;  Location: [facility] INVASIVE CV LAB;   Service: Cardiovascular;  Laterality: N/A;   CARDIAC CATHETERIZATION N/A 02/06/2015   Procedure: Coronary Stent Intervention;  Surgeon: Alm LELON Clay, MD;  Location: [facility] INVASIVE CV LAB;  Service: Cardiovascular;  Laterality: N/A;   COLONOSCOPY WITH PROPOFOL  N/A 10/09/2016   Sigmoid and descending colon diverticulosis, four 4-6 mm polyps in sigmoid, one 4 mm polyp in descending. Tubular adenomas and hyperplastic. 5 year surveillance.    COLONOSCOPY WITH PROPOFOL  N/A 11/24/2016   Sigmoid and descending colon diverticulosis, four 4-6 mm polyps in sigmoid, one 4 mm polyp in descending. Tubular adenomas and hyperplastic. 5 year surveillance.    COLONOSCOPY WITH PROPOFOL  N/A 10/18/2021   Carver: nonbleeding internal hemorrhoids, small and large mouth diverticula found in the sigmoid, descending, transverse colon.  A 9 mm sessile polyp was found in the ascending colon that was removed.  4 sessile polyps found in the sigmoid, descending, transverse colon 3 to 5 mm in size, examining otherwise.  Path revealed tubular adenomas.  Repeat due in 5 years for surveillance.   CORONARY ANGIOPLASTY WITH STENT PLACEMENT  01/2015   CORONARY STENT INTERVENTION N/A 04/27/2017   Procedure: CORONARY STENT INTERVENTION;  Surgeon: Mady Bruckner, MD;  Location: [facility] INVASIVE CV LAB;  Service: Cardiovascular;  Laterality: N/A;   ELECTROPHYSIOLOGY STUDY N/A 06/29/2017   Procedure: ELECTROPHYSIOLOGY STUDY;  Surgeon: Waddell Danelle LELON, MD;  Location: [facility] INVASIVE CV LAB;  Service: Cardiovascular;  Laterality: N/A;   ESOPHAGOGASTRODUODENOSCOPY (EGD) WITH PROPOFOL  N/A 10/09/2016   Dr. Shaaron: LA grade a esophagitis.  Barrett's esophagus, biopsy-proven.  Small hiatal hernia.  EGD February 2019   ESOPHAGOGASTRODUODENOSCOPY (EGD) WITH PROPOFOL  N/A 11/28/2019    salmon-colored esophageal mucosa (Barrett's) small hiatal hernia, portal hypertensive gastropathy, normal duodenum, 3 year surveillance   ESOPHAGOGASTRODUODENOSCOPY (EGD) WITH  PROPOFOL  N/A 09/06/2021   three columns of grade 1 varices in distal esophagus, no stigmata of bleeding or red wale signs. Small hiatal hernia. Mild portal gastropathy. Normal duodenum. Repeat upper endoscopy in 1 year for surveillance.   ESOPHAGOGASTRODUODENOSCOPY (EGD) WITH PROPOFOL  N/A 06/15/2023   Procedure: ESOPHAGOGASTRODUODENOSCOPY (EGD) WITH PROPOFOL ;  Surgeon: Shaaron Lamar HERO, MD;  [facility];  Service: Endoscopy;  Laterality: N/A;  830am, asa 3   FIDUCIAL MARKER PLACEMENT  10/16/2022   Procedure: FIDUCIAL MARKER PLACEMENT;  Surgeon: Gladis Leonor HERO, MD;  Location: [facility] ENDOSCOPY;  Service: Pulmonary;;   INCISION / DRAINAGE HAND / FINGER     LEFT HEART CATH AND CORONARY ANGIOGRAPHY N/A 04/27/2017   Procedure: LEFT HEART CATH AND CORONARY ANGIOGRAPHY;  Surgeon: Mady Bruckner, MD;  Location: [facility] INVASIVE CV LAB;  Service: Cardiovascular;  Laterality: N/A;   LOOP RECORDER INSERTION  06/29/2017   Procedure: Loop Recorder Insertion;  Surgeon: Waddell Danelle LELON, MD;  Location: [facility] INVASIVE CV LAB;  Service: Cardiovascular;;   POLYPECTOMY  11/24/2016   Procedure: POLYPECTOMY;  Surgeon: Lamar CHRISTELLA Hollingshead, MD;  [facility];  Service: Endoscopy;;  descending and sigmoid   POLYPECTOMY  10/18/2021   Procedure: POLYPECTOMY;  Surgeon: Cindie Carlin POUR, DO;  Location: [facility];  Service: Endoscopy;;   TOTAL HIP ARTHROPLASTY Right 07/01/2016   TOTAL HIP ARTHROPLASTY Right 07/01/2016   Procedure: RIGHT TOTAL HIP ARTHROPLASTY ANTERIOR APPROACH;  Surgeon: Lonni CINDERELLA Poli, MD;  Location: [facility] OR;  Service: Orthopedics;  Laterality: Right;   ULTRASOUND GUIDANCE FOR VASCULAR ACCESS Left 03/24/2024   Procedure: ULTRASOUND GUIDANCE, FOR VASCULAR ACCESS;  Surgeon: Magda Debby SAILOR, MD;  Location: [facility] OR;  Service: Vascular;  Laterality: Left;   Patient Active Problem List   Diagnosis Date Noted   Pain of left upper extremity 04/24/2024   Acute postoperative anemia due to greater  than expected blood loss 03/26/2024   Obesity, class 1 03/26/2024   Aneurysm of axillary artery 03/24/2024   Axillary artery injury 03/12/2024   Acute CVA (cerebrovascular accident) (HCC) 03/02/2024   Essential hypertension 03/02/2024   Dyslipidemia 03/02/2024   Type 2 diabetes mellitus with hyperlipidemia (HCC) 03/02/2024   Erectile dysfunction 02/24/2023   Chronic eczematous otitis externa of both ears 02/05/2023   Tobacco abuse 12/25/2022   Malignant neoplasm of upper lobe of right lung (HCC) 09/25/2022   Cervical spondylosis with myelopathy 08/01/2022   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   DDD (degenerative disc disease), cervical 05/21/2022   Osteoarthritis of both hands 05/19/2022   History of GI bleed 11/13/2021   Insomnia 09/13/2021   Cirrhosis of liver (HCC) 11/08/2018   Barrett's esophagus 08/04/2018   Vitamin D  deficiency 06/30/2018   Alcohol  abuse 06/30/2018   DM type 2 causing vascular disease (HCC) 08/17/2017   Chronic combined systolic and diastolic CHF (congestive heart failure) (HCC) 05/15/2017   OSA (obstructive sleep apnea) 04/28/2017   Depression with anxiety 04/15/2017   GERD (gastroesophageal reflux disease) 09/23/2016   Asthmatic bronchitis , chronic (HCC) 08/27/2015   Mixed hyperlipidemia 08/27/2015   Class 1 obesity due to excess calories with serious comorbidity and body mass index (BMI) of 32.0 to 32.9 in adult 08/27/2015   CAD (coronary artery disease) 05/31/2015   Essential hypertension, benign 02/09/2015    PCP: Cook, Jayce G, DO   REFERRING PROVIDER: Noralee Elidia Sieving*   REFERRING DIAG: left axillary pseudoaneurysm   Rationale for Evaluation and Treatment:  Rehabiliation  THERAPY DIAG:  Muscle weakness (generalized)  Acute pain of left shoulder  ONSET DATE: 03/11/24   SUBJECTIVE:  SUBJECTIVE STATEMENT: He relays still cannot use his left shoulder very well but his hand and elbow are working better. Not much pain today in his left arm.    PERTINENT HISTORY:  past medical history of diabetes mellitus type 2, hypertension, CAD, liver cirrhosis, anxiety, depression, OSA, CVA w/ left-sided weakness who had a recent hospitalization 7/11 to 03/19/24 post traumatic left shoulder dislocation, complicated with left axillary hematoma with left axillary pseudoaneurysm. On 07/12 he underwent open exposure and repair of the left axillary pseudoaneurysm and evacuation of left axillary hematoma.   PAIN:  NPRS scale: 1/10 Pain location:left shoulder/axillary, left forearm and wrist Pain description: constant, achy, sharp Aggravating factors: any use of this left arm. Relieving factors: rest, meds   PRECAUTIONS: ,  Shoulder  RED FLAGS: None   WEIGHT BEARING RESTRICTIONS:  Not anymore  FALLS:  Has patient fallen in last 6 months? Yes. Number of falls 1   OCCUPATION:  disabled  PLOF:  Independent with basic ADLs  PATIENT GOALS:  Reduce pain and get his arm back to normal  OBJECTIVE:  Note: Objective measures were completed at Evaluation unless otherwise noted.  DIAGNOSTIC FINDINGS:  Chest CT IMPRESSION: 1. Enlarging left axillary hematoma with arterial contrast extravasation from the posterior wall of the left axillary artery into a 1.5 x 1.8 cm pseudoaneurysm, with additional 1.8 x 1.1 cm pocket of contrast more posteriorly within the collection. 2. Increased heterogeneous hematoma in the left subscapularis region measuring 7.3 x 4.8 cm, but no focal contrast is seen within this. 3. Emphysema. 4. Right apical fiducial markers and post XRT changes, with treated nodule just above the fiducial markers today measuring 8 mm, previously 9 mm. 5. Aortic and coronary artery atherosclerosis.  Right aortic arch. 6. Enlarged pulmonary trunk  3.3 cm indicating arterial hypertension. 7. Cirrhosis and splenomegaly.  Prominent patent portal vein 1.7 cm. 8. Critical Value/emergent results were called by telephone at the time of interpretation on 03/24/2024 at 5:18 am to provider [facility] , who verbally acknowledged these results.  Shoulder XR IMPRESSION: No acute bony abnormality.   Postoperative changes in the left axilla.  No imaging for left elbow/wrist  06/03/24 EMG & NCS Examination  Impression: 1. This is an abnormal electrodiagnostic study of the left upper extremity 2. There is electrodiagnostic evidence of a diffuse, subacute brachial plexopathy affecting the upper, middle, and lower trunks of the left upper extremity with widespread denervation changes indicating ongoing axonal injury   PATIENT SURVEYS:  Patient-Specific Activity Scoring Scheme  0 represents unable to perform. 10 represents able to perform at prior level. 0 1 2 3 4 5 6 7 8 9  10 (Date and Score)   Activity Eval  06/21/24  1. Raising left arm  0 0   2. Putting on and off shirt 1  8  3.     4.    5.    Score 0.5/10 4/10   Total score = sum of the activity scores/number of activities Minimum detectable change (90%CI) for average score = 2 points Minimum detectable change (90%CI) for single activity score = 3 points     EDEMA:  Yes: moderate edema noted in left upper extremity from shoulder to hand  Observation:  2 incisions noted, one at proximal chest, one at axilla, both incisions closed with stitches and appear closed without any drainage or signs of infection at the moment. Dressing was removed and changed for him at eval.     UPPER EXTREMITY ROM:  AROM/Passive ROM Left eval Left 04/13/24 Left 05/24/24 Left 06/21/24 Left 08/16/24  Shoulder flexion /50 /120 30/150 40/ 40/WFL  Shoulder extension       Shoulder abduction /50 /100 20/140 22/ 22/WFL  Shoulder adduction       Shoulder extension       Shoulder internal  rotation /50    L2 behind back in standing /  Shoulder external rotation /40 /70   To ear in standing/  Elbow flexion       Elbow extension       Wrist flexion       Wrist extension       Wrist ulnar deviation       Wrist radial deviation       Wrist pronation       Wrist supination        (Blank rows = not tested)   UPPER EXTREMITY MMT:  MMT Left eval Left 05/24/24 Left 06/21/24 Left 07/19/24 Left 08/16/24  Shoulder flexion 1 2 2 2 2   Shoulder extension       Shoulder abduction 1 2 2 2 2   Shoulder adduction       Shoulder extension       Shoulder internal rotation 1 2 4 4 4   Shoulder external rotation 1 2 3  3+ 3+  Middle trapezius       Lower trapezius       Elbow flexion 1 3 4 4 4   Elbow extension 2 3 4 4 4   Wrist flexion       Wrist extension       Wrist ulnar deviation       Wrist radial deviation       Wrist pronation       Wrist supination       Grip strength     3   (Blank rows = not tested)                                                                                                                              TREATMENT DATE:  08/22/24 Theractivity (ROM and strength for reaching, push/pull, gripping) UBE L2 X 10 min , able to maintain grip the whole 10 min now Wall ladder X 10 reps Pulleys X 15 reps flexion with slow eccentrics Standing deltoid flexion isometric pushing into green ball 5 sec X 10 Standing p ball roll outs on mat table for AAROM flexion, abduction, circles CW & CCW X 10 reps each for reaching Gripping ace bandage X 25 Standing scapular retractions X15 with green band wrapped around hand Standing bicep curls 2X10  with green band around hand Standing chest press green X 10 Standing IR green X 10 Standing shoulder adduction Green X 10 Standing shoulder extension Green X 10 Standing ER Green X 10 Supine chest press AROM X 15 with slow eccentric Supine shoulder flexion AAROM with cane X 10 with slow eccentrics Supine shoulder abduction  AAROM with cane X 10 with slow eccentrics   08/16/24 Theractivity (ROM and strength for reaching, push/pull, gripping) UBE L2 X 10 min , able to maintain grip the whole 10 min now Wall ladder X 10 reps Standing p ball roll outs on mat table for AAROM flexion, abduction, circles CW & CCW X 10 reps each for reaching Gripping ace bandage X 25 Standing scapular retractions X15 with red band wrapped around hand Standing bicep curls 2X10  with red band around hand Standing chest press red X15 Standing IR red 2 X 10 Standing shoulder adduction red 2X10 Standing shoulder extension red2 X10 Standing ER red 2X10 Supine chest press AROM X 15 with slow eccentric Supine shoulder flexion AAROM with cane X 10 with slow eccentrics Supine shoulder abduction AAROM with cane X 10 with slow eccentrics Pulleys X 15 reps flexion with slow eccentrics  PATIENT EDUCATION: Education details: HEP, PT plan of care, selfcare Person educated: Patient Education method: Explanation, Demonstration, Verbal cues, and Handouts Education comprehension: verbalized understanding, further education recommended   HOME EXERCISE PROGRAM: Access Code: [email or web address] URL: [email or web address] Date: 03/31/2024 Prepared by: Redell Moose  Exercises - Circular Shoulder Pendulum with Table Support  - 2-3 x daily - 7 x weekly - 1 sets - 15 reps - Seated Shoulder Abduction Towel Slide at Table Top  - 2-3 x daily - 6 x weekly - 1 sets - 10 reps - 5 sec hold - Seated Shoulder Flexion Towel Slide at Table Top  - 2-3 x daily - 6 x weekly - 1 sets - 10 reps - 5 sec hold - Seated Shoulder External Rotation PROM on Table  - 2-3 x daily - 6 x weekly - 1 sets - 10 reps - 5 sec hold - Seated Gripping Towel  - 2-3 x daily - 6 x weekly - 1 sets - 20 reps - Standing Elbow Flexion Extension AROM  - 2-3 x daily - 6 x weekly - 1 sets - 10 reps  ASSESSMENT: He is improving with the function of his hand and elbow. His shoulder  function remains very limited. We continue to work to try to improve this and develop compensatory strategies to make up for his lack of deltoid strength.    OBJECTIVE IMPAIRMENTS: decreased activity tolerance for ADL's, difficulty walking, decreased balance, decreased endurance, decreased mobility, decreased ROM, decreased strength, impaired flexibility, impaired UE use, and pain.  ACTIVITY LIMITATIONS: bending, lifting, carry, reaching, locomotion, cleaning, community activity, driving,  PERSONAL FACTORS: see above PMH  also affecting patient's functional outcome.  REHAB POTENTIAL: Good  CLINICAL DECISION MAKING: Evolving/moderate complexity  EVALUATION COMPLEXITY: Moderate    GOALS: Short term PT Goals Target date: 04/28/2024   Pt will be I and compliant with HEP. Baseline:  Goal status: MET 04/18/24 Pt will decrease pain by 25% overall Baseline: Goal status: MET 04/18/24  Long term PT goals Target date:06/23/2024  Pt will improve right shoulder and elbow AROM to [facility] to improve functional mobility Baseline: Goal status: ongoing 08/16/24 Pt will improve  right arm strength to at least 4+/5 MMT to improve functional strength Baseline: Goal status: ongoing 08/16/24 Pt will improve Patient specific functional scale (PSFS) to at least 7/10 to show improved function level Baseline: Goal status: ongoing 08/16/24 Pt will reduce pain to overall less than 3/10 with usual activity Baseline: Goal status: ongoing 08/16/24 PLAN: PT FREQUENCY: 1-3 times per week   PT DURATION: 6-8 weeks  PLANNED INTERVENTIONS (unless contraindicated):  97110-Therapeutic exercises, 97530- Therapeutic  activity, [id number]- Neuromuscular re-education, [id number]- Self Care, [id number]- Manual therapy, [id number]- Electrical stimulation (unattended), [id number]- Vasopneumatic device, [id number]- Ultrasound, and 97033- Ionotophoresis 4mg /ml Dexamethasone   PLAN FOR NEXT SESSION: progress strength and function as tolerated,  compensatory strategies for deltoid  Redell JONELLE Moose, PT,DPT 08/22/2024, 8:03 AM   "

## 2024-08-31 ENCOUNTER — Ambulatory Visit: Admitting: Physical Therapy

## 2024-08-31 ENCOUNTER — Encounter: Payer: Self-pay | Admitting: Physical Therapy

## 2024-08-31 DIAGNOSIS — M6281 Muscle weakness (generalized): Secondary | ICD-10-CM | POA: Diagnosis not present

## 2024-08-31 DIAGNOSIS — M25512 Pain in left shoulder: Secondary | ICD-10-CM

## 2024-08-31 NOTE — Therapy (Signed)
 " OUTPATIENT PHYSICAL THERAPY TREATMENT   Patient Name: Randy Hebert MRN: [id number] DOB:01-27-1966, 58 y.o., male Today's Date: 08/31/2024  END OF SESSION:  PT End of Session - 08/31/24 0842     Visit Number 17    Number of Visits 24    Date for Recertification  10/11/24    Progress Note Due on Visit 20    PT Start Time 0835    PT Stop Time 0915    PT Time Calculation (min) 40 min    Activity Tolerance Patient tolerated treatment well    Behavior During Therapy [facility] for tasks assessed/performed             Past Medical History:  Diagnosis Date   ACE inhibitor-aggravated angioedema    Allergy     Anemia    Angio-edema    Anxiety    Arthritis    Asthma    hip replacement   Back pain    Bradycardia 04/28/2017   Bulging of cervical intervertebral disc    CAD (coronary artery disease)    lateral STEMI 02/06/2015 00% D1 occlusion treated with Promus Premier 2.5 mm x 16 mm DES, 70% ramus stenosis, 40% mid RCA stenosis, 45% distal RCA stenosis, EF 45-50%   CHF (congestive heart failure) (HCC)    COPD (chronic obstructive pulmonary disease) (HCC)    Depression    Diabetes mellitus without complication (HCC)    Difficult intubation    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   Dry eye    Dyspnea    Early satiety 09/23/2016   Fatty liver    GERD (gastroesophageal reflux disease)    HCAP (healthcare-associated pneumonia) 05/15/2017   Headache    Heart murmur    Hip pain    Hyperlipidemia    Hypertension    Lobar pneumonia 05/15/2017   Melena 08/04/2018   MI (myocardial infarction) (HCC)    Myocardial infarction (HCC)    Neck pain    Non-ST elevation (NSTEMI) myocardial infarction (HCC) 04/27/2017   NSTEMI (non-ST elevated myocardial infarction) (HCC) 04/26/2017   Otitis media    Pleurisy    Pneumonia due to COVID-19 virus 10/07/2019   Rectal bleeding 11/08/2018   Right shoulder pain 03/20/2020   Sinus pause    9 sec sinus pause on telemetry after  started on coreg  after MI, avoid AV nodal blocking agent   Sleep apnea    uses a cpap   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   STEMI (ST elevation myocardial infarction) (HCC) 05/31/2015   Substance abuse (HCC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   Syncope 06/29/2017   Syncope and collapse 05/15/2017   Transaminitis 08/04/2018   Unilateral primary osteoarthritis, right hip 07/01/2016   Past Surgical History:  Procedure Laterality Date   ARTERY EXPLORATION Left 03/24/2024   Procedure: LEFT ARCH AORTOGRAM, LEFT UPPER EXTREMITY ANGIOGRAM WITH FIRST ORDER CANNULATION, LEFT AXILLARY ANGIOPLASTY AND STENTING, EVACUATION OF LEFT AXILLARY HEMATOMA;  Surgeon: Magda Debby SAILOR, MD;  Location: [facility] OR;  Service: Vascular;  Laterality: Left;   ARTERY REPAIR Left 03/11/2024   Procedure: REPAIR LEFT AXILLARY ARTERY;  Surgeon: Serene Gaile ORN, MD;  Location: [facility] OR;  Service: Vascular;  Laterality: Left;  ARTERIAL REPAIR, LEFT AXILLARY   BIOPSY  10/09/2016   Procedure: BIOPSY;  Surgeon: Lamar CHRISTELLA Hollingshead, MD;  Location: [facility] ENDO SUITE;  Service: Endoscopy;;   BIOPSY  11/28/2019   Procedure: BIOPSY;  Surgeon: Hollingshead Lamar CHRISTELLA, MD;  Location: [facility] ENDO  SUITE;  Service: Endoscopy;;   BIOPSY  06/15/2023   Procedure: BIOPSY;  Surgeon: Shaaron Lamar HERO, MD;  Location: [facility];  Service: Endoscopy;;   BRONCHIAL BIOPSY  10/16/2022   Procedure: BRONCHIAL BIOPSIES;  Surgeon: Gladis Leonor HERO, MD;  Location: [facility] ENDOSCOPY;  Service: Pulmonary;;   BRONCHIAL NEEDLE ASPIRATION BIOPSY  10/16/2022   Procedure: BRONCHIAL NEEDLE ASPIRATION BIOPSIES;  Surgeon: Gladis Leonor HERO, MD;  Location: [facility] ENDOSCOPY;  Service: Pulmonary;;   BRONCHIAL WASHINGS  10/16/2022   Procedure: BRONCHIAL WASHINGS;  Surgeon: Gladis Leonor HERO, MD;  Location: [facility] ENDOSCOPY;  Service: Pulmonary;;   CARDIAC CATHETERIZATION N/A 02/06/2015   Procedure: Left Heart Cath and Coronary Angiography;  Surgeon: Alm LELON Clay, MD;  Location: [facility] INVASIVE CV LAB;   Service: Cardiovascular;  Laterality: N/A;   CARDIAC CATHETERIZATION N/A 02/06/2015   Procedure: Coronary Stent Intervention;  Surgeon: Alm LELON Clay, MD;  Location: [facility] INVASIVE CV LAB;  Service: Cardiovascular;  Laterality: N/A;   COLONOSCOPY WITH PROPOFOL  N/A 10/09/2016   Sigmoid and descending colon diverticulosis, four 4-6 mm polyps in sigmoid, one 4 mm polyp in descending. Tubular adenomas and hyperplastic. 5 year surveillance.    COLONOSCOPY WITH PROPOFOL  N/A 11/24/2016   Sigmoid and descending colon diverticulosis, four 4-6 mm polyps in sigmoid, one 4 mm polyp in descending. Tubular adenomas and hyperplastic. 5 year surveillance.    COLONOSCOPY WITH PROPOFOL  N/A 10/18/2021   Carver: nonbleeding internal hemorrhoids, small and large mouth diverticula found in the sigmoid, descending, transverse colon.  A 9 mm sessile polyp was found in the ascending colon that was removed.  4 sessile polyps found in the sigmoid, descending, transverse colon 3 to 5 mm in size, examining otherwise.  Path revealed tubular adenomas.  Repeat due in 5 years for surveillance.   CORONARY ANGIOPLASTY WITH STENT PLACEMENT  01/2015   CORONARY STENT INTERVENTION N/A 04/27/2017   Procedure: CORONARY STENT INTERVENTION;  Surgeon: Mady Bruckner, MD;  Location: [facility] INVASIVE CV LAB;  Service: Cardiovascular;  Laterality: N/A;   ELECTROPHYSIOLOGY STUDY N/A 06/29/2017   Procedure: ELECTROPHYSIOLOGY STUDY;  Surgeon: Waddell Danelle LELON, MD;  Location: [facility] INVASIVE CV LAB;  Service: Cardiovascular;  Laterality: N/A;   ESOPHAGOGASTRODUODENOSCOPY (EGD) WITH PROPOFOL  N/A 10/09/2016   Dr. Shaaron: LA grade a esophagitis.  Barrett's esophagus, biopsy-proven.  Small hiatal hernia.  EGD February 2019   ESOPHAGOGASTRODUODENOSCOPY (EGD) WITH PROPOFOL  N/A 11/28/2019    salmon-colored esophageal mucosa (Barrett's) small hiatal hernia, portal hypertensive gastropathy, normal duodenum, 3 year surveillance   ESOPHAGOGASTRODUODENOSCOPY (EGD) WITH  PROPOFOL  N/A 09/06/2021   three columns of grade 1 varices in distal esophagus, no stigmata of bleeding or red wale signs. Small hiatal hernia. Mild portal gastropathy. Normal duodenum. Repeat upper endoscopy in 1 year for surveillance.   ESOPHAGOGASTRODUODENOSCOPY (EGD) WITH PROPOFOL  N/A 06/15/2023   Procedure: ESOPHAGOGASTRODUODENOSCOPY (EGD) WITH PROPOFOL ;  Surgeon: Shaaron Lamar HERO, MD;  Location: [facility];  Service: Endoscopy;  Laterality: N/A;  830am, asa 3   FIDUCIAL MARKER PLACEMENT  10/16/2022   Procedure: FIDUCIAL MARKER PLACEMENT;  Surgeon: Gladis Leonor HERO, MD;  Location: [facility] ENDOSCOPY;  Service: Pulmonary;;   INCISION / DRAINAGE HAND / FINGER     LEFT HEART CATH AND CORONARY ANGIOGRAPHY N/A 04/27/2017   Procedure: LEFT HEART CATH AND CORONARY ANGIOGRAPHY;  Surgeon: Mady Bruckner, MD;  Location: [facility] INVASIVE CV LAB;  Service: Cardiovascular;  Laterality: N/A;   LOOP RECORDER INSERTION  06/29/2017   Procedure: Loop Recorder Insertion;  Surgeon: Waddell Danelle LELON, MD;  Location: [facility] INVASIVE CV LAB;  Service: Cardiovascular;;   POLYPECTOMY  11/24/2016   Procedure: POLYPECTOMY;  Surgeon: Lamar CHRISTELLA Hollingshead, MD;  Location: [facility];  Service: Endoscopy;;  descending and sigmoid   POLYPECTOMY  10/18/2021   Procedure: POLYPECTOMY;  Surgeon: Cindie Carlin POUR, DO;  Location: [facility];  Service: Endoscopy;;   TOTAL HIP ARTHROPLASTY Right 07/01/2016   TOTAL HIP ARTHROPLASTY Right 07/01/2016   Procedure: RIGHT TOTAL HIP ARTHROPLASTY ANTERIOR APPROACH;  Surgeon: Lonni CINDERELLA Poli, MD;  Location: [facility] OR;  Service: Orthopedics;  Laterality: Right;   ULTRASOUND GUIDANCE FOR VASCULAR ACCESS Left 03/24/2024   Procedure: ULTRASOUND GUIDANCE, FOR VASCULAR ACCESS;  Surgeon: Magda Debby SAILOR, MD;  Location: [facility] OR;  Service: Vascular;  Laterality: Left;   Patient Active Problem List   Diagnosis Date Noted   Pain of left upper extremity 04/24/2024   Acute postoperative anemia due to greater  than expected blood loss 03/26/2024   Obesity, class 1 03/26/2024   Aneurysm of axillary artery 03/24/2024   Axillary artery injury 03/12/2024   Acute CVA (cerebrovascular accident) (HCC) 03/02/2024   Essential hypertension 03/02/2024   Dyslipidemia 03/02/2024   Type 2 diabetes mellitus with hyperlipidemia (HCC) 03/02/2024   Erectile dysfunction 02/24/2023   Chronic eczematous otitis externa of both ears 02/05/2023   Tobacco abuse 12/25/2022   Malignant neoplasm of upper lobe of right lung (HCC) 09/25/2022   Cervical spondylosis with myelopathy 08/01/2022   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   DDD (degenerative disc disease), cervical 05/21/2022   Osteoarthritis of both hands 05/19/2022   History of GI bleed 11/13/2021   Insomnia 09/13/2021   Cirrhosis of liver (HCC) 11/08/2018   Barrett's esophagus 08/04/2018   Vitamin D  deficiency 06/30/2018   Alcohol  abuse 06/30/2018   DM type 2 causing vascular disease (HCC) 08/17/2017   Chronic combined systolic and diastolic CHF (congestive heart failure) (HCC) 05/15/2017   OSA (obstructive sleep apnea) 04/28/2017   Depression with anxiety 04/15/2017   GERD (gastroesophageal reflux disease) 09/23/2016   Asthmatic bronchitis , chronic (HCC) 08/27/2015   Mixed hyperlipidemia 08/27/2015   Class 1 obesity due to excess calories with serious comorbidity and body mass index (BMI) of 32.0 to 32.9 in adult 08/27/2015   CAD (coronary artery disease) 05/31/2015   Essential hypertension, benign 02/09/2015    PCP: Cook, Jayce G, DO   REFERRING PROVIDER: Noralee Elidia Sieving*   REFERRING DIAG: left axillary pseudoaneurysm   Rationale for Evaluation and Treatment:  Rehabiliation  THERAPY DIAG:  Muscle weakness (generalized)  Acute pain of left shoulder  ONSET DATE: 03/11/24   SUBJECTIVE:  SUBJECTIVE STATEMENT: He does relay some improvement with his hand function, able to use it more, able to carry in groceries now.    PERTINENT HISTORY:  past medical history of diabetes mellitus type 2, hypertension, CAD, liver cirrhosis, anxiety, depression, OSA, CVA w/ left-sided weakness who had a recent hospitalization 7/11 to 03/19/24 post traumatic left shoulder dislocation, complicated with left axillary hematoma with left axillary pseudoaneurysm. On 07/12 he underwent open exposure and repair of the left axillary pseudoaneurysm and evacuation of left axillary hematoma.   PAIN:  NPRS scale: 1/10 Pain location:left shoulder/axillary, left forearm and wrist Pain description: constant, achy, sharp Aggravating factors: any use of this left arm. Relieving factors: rest, meds   PRECAUTIONS: ,  Shoulder  RED FLAGS: None   WEIGHT BEARING RESTRICTIONS:  Not anymore  FALLS:  Has patient fallen in last 6 months? Yes. Number of falls 1   OCCUPATION:  disabled  PLOF:  Independent with basic ADLs  PATIENT GOALS:  Reduce pain and get his arm back to normal  OBJECTIVE:  Note: Objective measures were completed at Evaluation unless otherwise noted.  DIAGNOSTIC FINDINGS:  Chest CT IMPRESSION: 1. Enlarging left axillary hematoma with arterial contrast extravasation from the posterior wall of the left axillary artery into a 1.5 x 1.8 cm pseudoaneurysm, with additional 1.8 x 1.1 cm pocket of contrast more posteriorly within the collection. 2. Increased heterogeneous hematoma in the left subscapularis region measuring 7.3 x 4.8 cm, but no focal contrast is seen within this. 3. Emphysema. 4. Right apical fiducial markers and post XRT changes, with treated nodule just above the fiducial markers today measuring 8 mm, previously 9 mm. 5. Aortic and coronary artery atherosclerosis.  Right aortic arch. 6. Enlarged pulmonary trunk 3.3 cm indicating arterial  hypertension. 7. Cirrhosis and splenomegaly.  Prominent patent portal vein 1.7 cm. 8. Critical Value/emergent results were called by telephone at the time of interpretation on 03/24/2024 at 5:18 am to provider [facility] , who verbally acknowledged these results.  Shoulder XR IMPRESSION: No acute bony abnormality.   Postoperative changes in the left axilla.  No imaging for left elbow/wrist  06/03/24 EMG & NCS Examination  Impression: 1. This is an abnormal electrodiagnostic study of the left upper extremity 2. There is electrodiagnostic evidence of a diffuse, subacute brachial plexopathy affecting the upper, middle, and lower trunks of the left upper extremity with widespread denervation changes indicating ongoing axonal injury   PATIENT SURVEYS:  Patient-Specific Activity Scoring Scheme  0 represents unable to perform. 10 represents able to perform at prior level. 0 1 2 3 4 5 6 7 8 9  10 (Date and Score)   Activity Eval  06/21/24  1. Raising left arm  0 0   2. Putting on and off shirt 1  8  3.     4.    5.    Score 0.5/10 4/10   Total score = sum of the activity scores/number of activities Minimum detectable change (90%CI) for average score = 2 points Minimum detectable change (90%CI) for single activity score = 3 points     EDEMA:  Yes: moderate edema noted in left upper extremity from shoulder to hand  Observation:  2 incisions noted, one at proximal chest, one at axilla, both incisions closed with stitches and appear closed without any drainage or signs of infection at the moment. Dressing was removed and changed for him at eval.     UPPER EXTREMITY ROM:  AROM/Passive ROM Left eval Left 04/13/24  Left 05/24/24 Left 06/21/24 Left 08/16/24  Shoulder flexion /50 /120 30/150 40/ 40/WFL  Shoulder extension       Shoulder abduction /50 /100 20/140 22/ 22/WFL  Shoulder adduction       Shoulder extension       Shoulder internal rotation /50    L2 behind  back in standing /  Shoulder external rotation /40 /70   To ear in standing/  Elbow flexion       Elbow extension       Wrist flexion       Wrist extension       Wrist ulnar deviation       Wrist radial deviation       Wrist pronation       Wrist supination        (Blank rows = not tested)   UPPER EXTREMITY MMT:  MMT Left eval Left 05/24/24 Left 06/21/24 Left 07/19/24 Left 08/16/24  Shoulder flexion 1 2 2 2 2   Shoulder extension       Shoulder abduction 1 2 2 2 2   Shoulder adduction       Shoulder extension       Shoulder internal rotation 1 2 4 4 4   Shoulder external rotation 1 2 3  3+ 3+  Middle trapezius       Lower trapezius       Elbow flexion 1 3 4 4 4   Elbow extension 2 3 4 4 4   Wrist flexion       Wrist extension       Wrist ulnar deviation       Wrist radial deviation       Wrist pronation       Wrist supination       Grip strength     3   (Blank rows = not tested)                                                                                                                              TREATMENT DATE:  08/31/24 Theractivity (ROM and strength for reaching, push/pull, gripping) UBE L3 X 10 min , able to maintain grip the whole 10 min now Wall ladder X 10 reps flexion, X 4 reps scaption Pulleys X 15 reps flexion with slow eccentrics Standing deltoid flexion isometric pushing into green ball 5 sec X 10, and abduction isometric 5 sec X 10 Standing p ball roll outs on mat table with big blue Pball for AAROM flexion, abduction, circles CW & CCW X 15 reps each for reaching Gripping ace bandage X 25 Standing bicep curls 2X10  with green band around hand Standing chest press green X 15 Standing IR green X 15 Standing shoulder adduction Green X 15 Standing shoulder row Green X 15 Standing shoulder extension Green X 15 Standing ER Green X 15 Supine shoulder flexion AAROM with cane X 10 with slow eccentrics Supine shoulder abduction AAROM with cane X  10 with  slow eccentrics  08/22/24 Theractivity (ROM and strength for reaching, push/pull, gripping) UBE L2 X 10 min , able to maintain grip the whole 10 min now Wall ladder X 10 reps Pulleys X 15 reps flexion with slow eccentrics Standing deltoid flexion isometric pushing into green ball 5 sec X 10 Standing p ball roll outs on mat table for AAROM flexion, abduction, circles CW & CCW X 10 reps each for reaching Gripping ace bandage X 25 Standing scapular retractions X15 with green band wrapped around hand Standing bicep curls 2X10  with green band around hand Standing chest press green X 10 Standing IR green X 10 Standing shoulder adduction Green X 10 Standing shoulder extension Green X 10 Standing ER Green X 10 Supine chest press AROM X 15 with slow eccentric Supine shoulder flexion AAROM with cane X 10 with slow eccentrics Supine shoulder abduction AAROM with cane X 10 with slow eccentrics   PATIENT EDUCATION: Education details: HEP, PT plan of care, selfcare Person educated: Patient Education method: Explanation, Demonstration, Verbal cues, and Handouts Education comprehension: verbalized understanding, further education recommended   HOME EXERCISE PROGRAM: Access Code: [email or web address] URL: [email or web address] Date: 03/31/2024 Prepared by: Redell Moose  Exercises - Circular Shoulder Pendulum with Table Support  - 2-3 x daily - 7 x weekly - 1 sets - 15 reps - Seated Shoulder Abduction Towel Slide at Table Top  - 2-3 x daily - 6 x weekly - 1 sets - 10 reps - 5 sec hold - Seated Shoulder Flexion Towel Slide at Table Top  - 2-3 x daily - 6 x weekly - 1 sets - 10 reps - 5 sec hold - Seated Shoulder External Rotation PROM on Table  - 2-3 x daily - 6 x weekly - 1 sets - 10 reps - 5 sec hold - Seated Gripping Towel  - 2-3 x daily - 6 x weekly - 1 sets - 20 reps - Standing Elbow Flexion Extension AROM  - 2-3 x daily - 6 x weekly - 1 sets - 10 reps  ASSESSMENT: Progressed  strength program today with good tolerance. Still with limited deltoid activation so working hard on this as well as strengthening up adjacent muscles to develop compensatory strategies.    OBJECTIVE IMPAIRMENTS: decreased activity tolerance for ADL's, difficulty walking, decreased balance, decreased endurance, decreased mobility, decreased ROM, decreased strength, impaired flexibility, impaired UE use, and pain.  ACTIVITY LIMITATIONS: bending, lifting, carry, reaching, locomotion, cleaning, community activity, driving,  PERSONAL FACTORS: see above PMH  also affecting patient's functional outcome.  REHAB POTENTIAL: Good  CLINICAL DECISION MAKING: Evolving/moderate complexity  EVALUATION COMPLEXITY: Moderate    GOALS: Short term PT Goals Target date: 04/28/2024   Pt will be I and compliant with HEP. Baseline:  Goal status: MET 04/18/24 Pt will decrease pain by 25% overall Baseline: Goal status: MET 04/18/24  Long term PT goals Target date:06/23/2024  Pt will improve right shoulder and elbow AROM to [facility] to improve functional mobility Baseline: Goal status: ongoing 08/16/24 Pt will improve  right arm strength to at least 4+/5 MMT to improve functional strength Baseline: Goal status: ongoing 08/16/24 Pt will improve Patient specific functional scale (PSFS) to at least 7/10 to show improved function level Baseline: Goal status: ongoing 08/16/24 Pt will reduce pain to overall less than 3/10 with usual activity Baseline: Goal status: ongoing 08/16/24 PLAN: PT FREQUENCY: 1-3 times per week   PT DURATION: 6-8 weeks  PLANNED INTERVENTIONS (unless contraindicated):  97110-Therapeutic exercises, 97530-  Therapeutic activity, [id number]- Neuromuscular re-education, [id number]- Self Care, 02859- Manual therapy, G0283- Electrical stimulation (unattended), 97016- Vasopneumatic device, [id number]- Ultrasound, and 97033- Ionotophoresis 4mg /ml Dexamethasone   PLAN FOR NEXT SESSION: progress strength and  function as tolerated, compensatory strategies for deltoid  Redell JONELLE Moose, PT,DPT 08/31/2024, 8:44 AM   "

## 2024-09-05 ENCOUNTER — Encounter (HOSPITAL_COMMUNITY)
Admission: RE | Admit: 2024-09-05 | Discharge: 2024-09-05 | Disposition: A | Discharge status: Home / Self Care | Source: Ambulatory Visit | Attending: Internal Medicine | Admitting: Internal Medicine

## 2024-09-06 ENCOUNTER — Observation Stay (HOSPITAL_COMMUNITY): Attending: Gastroenterology

## 2024-09-07 ENCOUNTER — Ambulatory Visit (HOSPITAL_COMMUNITY): Admitting: Anesthesiology

## 2024-09-07 ENCOUNTER — Encounter (HOSPITAL_COMMUNITY): Admission: RE | Disposition: A | Payer: Self-pay | Source: Home / Self Care | Attending: Internal Medicine

## 2024-09-07 ENCOUNTER — Encounter (HOSPITAL_COMMUNITY): Payer: Self-pay | Admitting: Internal Medicine

## 2024-09-07 ENCOUNTER — Ambulatory Visit (HOSPITAL_COMMUNITY)
Admission: RE | Admit: 2024-09-07 | Discharge: 2024-09-07 | Disposition: A | Discharge status: Home / Self Care | Attending: Internal Medicine | Admitting: Internal Medicine

## 2024-09-07 DIAGNOSIS — K644 Residual hemorrhoidal skin tags: Secondary | ICD-10-CM | POA: Insufficient documentation

## 2024-09-07 DIAGNOSIS — I110 Hypertensive heart disease with heart failure: Secondary | ICD-10-CM | POA: Diagnosis not present

## 2024-09-07 DIAGNOSIS — Z7985 Long-term (current) use of injectable non-insulin antidiabetic drugs: Secondary | ICD-10-CM | POA: Diagnosis not present

## 2024-09-07 DIAGNOSIS — K219 Gastro-esophageal reflux disease without esophagitis: Secondary | ICD-10-CM | POA: Diagnosis not present

## 2024-09-07 DIAGNOSIS — Z7902 Long term (current) use of antithrombotics/antiplatelets: Secondary | ICD-10-CM | POA: Insufficient documentation

## 2024-09-07 DIAGNOSIS — F32A Depression, unspecified: Secondary | ICD-10-CM | POA: Insufficient documentation

## 2024-09-07 DIAGNOSIS — I2510 Atherosclerotic heart disease of native coronary artery without angina pectoris: Secondary | ICD-10-CM | POA: Diagnosis not present

## 2024-09-07 DIAGNOSIS — I509 Heart failure, unspecified: Secondary | ICD-10-CM | POA: Diagnosis not present

## 2024-09-07 DIAGNOSIS — G4730 Sleep apnea, unspecified: Secondary | ICD-10-CM | POA: Diagnosis not present

## 2024-09-07 DIAGNOSIS — Z7951 Long term (current) use of inhaled steroids: Secondary | ICD-10-CM | POA: Insufficient documentation

## 2024-09-07 DIAGNOSIS — E785 Hyperlipidemia, unspecified: Secondary | ICD-10-CM | POA: Insufficient documentation

## 2024-09-07 DIAGNOSIS — F419 Anxiety disorder, unspecified: Secondary | ICD-10-CM | POA: Insufficient documentation

## 2024-09-07 DIAGNOSIS — K5730 Diverticulosis of large intestine without perforation or abscess without bleeding: Secondary | ICD-10-CM | POA: Diagnosis not present

## 2024-09-07 DIAGNOSIS — J449 Chronic obstructive pulmonary disease, unspecified: Secondary | ICD-10-CM | POA: Insufficient documentation

## 2024-09-07 DIAGNOSIS — I252 Old myocardial infarction: Secondary | ICD-10-CM | POA: Insufficient documentation

## 2024-09-07 DIAGNOSIS — R519 Headache, unspecified: Secondary | ICD-10-CM | POA: Diagnosis not present

## 2024-09-07 DIAGNOSIS — Z8673 Personal history of transient ischemic attack (TIA), and cerebral infarction without residual deficits: Secondary | ICD-10-CM | POA: Insufficient documentation

## 2024-09-07 DIAGNOSIS — R0602 Shortness of breath: Secondary | ICD-10-CM | POA: Insufficient documentation

## 2024-09-07 DIAGNOSIS — D649 Anemia, unspecified: Secondary | ICD-10-CM | POA: Diagnosis not present

## 2024-09-07 DIAGNOSIS — Z79899 Other long term (current) drug therapy: Secondary | ICD-10-CM | POA: Insufficient documentation

## 2024-09-07 DIAGNOSIS — K641 Second degree hemorrhoids: Secondary | ICD-10-CM | POA: Diagnosis not present

## 2024-09-07 DIAGNOSIS — K529 Noninfective gastroenteritis and colitis, unspecified: Secondary | ICD-10-CM | POA: Diagnosis present

## 2024-09-07 DIAGNOSIS — F17210 Nicotine dependence, cigarettes, uncomplicated: Secondary | ICD-10-CM | POA: Insufficient documentation

## 2024-09-07 DIAGNOSIS — E119 Type 2 diabetes mellitus without complications: Secondary | ICD-10-CM | POA: Insufficient documentation

## 2024-09-07 HISTORY — PX: COLONOSCOPY: SHX5424

## 2024-09-07 LAB — GLUCOSE, CAPILLARY: Glucose-Capillary: 141 mg/dL — ABNORMAL HIGH (ref 70–99)

## 2024-09-07 MED ORDER — LACTATED RINGERS IV SOLN: INTRAVENOUS | Status: DC

## 2024-09-07 MED ORDER — LIDOCAINE HCL 1 % IJ SOLN
INTRAMUSCULAR | Status: DC | PRN
  Administered 2024-09-07: 50 mg via INTRADERMAL

## 2024-09-07 MED ORDER — PROPOFOL 500 MG/50ML IV EMUL
INTRAVENOUS | Status: DC | PRN
  Administered 2024-09-07: 150 ug/kg/min via INTRAVENOUS

## 2024-09-07 MED ORDER — PROPOFOL 10 MG/ML IV BOLUS
INTRAVENOUS | Status: DC | PRN
  Administered 2024-09-07: 150 mg via INTRAVENOUS

## 2024-09-07 NOTE — Transfer of Care (Signed)
 Immediate Anesthesia Transfer of Care Note  Patient: Randy Hebert  Procedure(s) Performed: COLONOSCOPY  Patient Location: Short Stay  Anesthesia Type:General  Level of Consciousness: awake  Airway & Oxygen  Therapy: Patient Spontanous Breathing  Post-op Assessment: Report given to RN  Post vital signs: Reviewed and stable  Last Vitals:  Vitals Value Taken Time  BP    Temp    Pulse    Resp    SpO2      Last Pain:  Vitals:   09/07/24 1143  PainSc: 0-No pain         Complications: No notable events documented.

## 2024-09-07 NOTE — Discharge Instructions (Signed)
" °  Colonoscopy Discharge Instructions  Read the instructions outlined below and refer to this sheet in the next few weeks. These discharge instructions provide you with general information on caring for yourself after you leave the hospital. Your doctor may also give you specific instructions. While your treatment has been planned according to the most current medical practices available, unavoidable complications occasionally occur. If you have any problems or questions after discharge, call Dr. Shaaron at [phone]. ACTIVITY You may resume your regular activity, but move at a slower pace for the next 24 hours.  Take frequent rest periods for the next 24 hours.  Walking will help get rid of the air and reduce the bloated feeling in your belly (abdomen).  No driving for 24 hours (because of the medicine (anesthesia) used during the test).   Do not sign any important legal documents or operate any machinery for 24 hours (because of the anesthesia used during the test).  NUTRITION Drink plenty of fluids.  You may resume your normal diet as instructed by your doctor.  Begin with a light meal and progress to your normal diet. Heavy or fried foods are harder to digest and may make you feel sick to your stomach (nauseated).  Avoid alcoholic beverages for 24 hours or as instructed.  MEDICATIONS You may resume your normal medications unless your doctor tells you otherwise.  WHAT YOU CAN EXPECT TODAY Some feelings of bloating in the abdomen.  Passage of more gas than usual.  Spotting of blood in your stool or on the toilet paper.  IF YOU HAD POLYPS REMOVED DURING THE COLONOSCOPY: No aspirin  products for 7 days or as instructed.  No alcohol  for 7 days or as instructed.  Eat a soft diet for the next 24 hours.  FINDING OUT THE RESULTS OF YOUR TEST Not all test results are available during your visit. If your test results are not back during the visit, make an appointment with your caregiver to find out the  results. Do not assume everything is normal if you have not heard from your caregiver or the medical facility. It is important for you to follow up on all of your test results.  SEEK IMMEDIATE MEDICAL ATTENTION IF: You have more than a spotting of blood in your stool.  Your belly is swollen (abdominal distention).  You are nauseated or vomiting.  You have a temperature over 101.  You have abdominal pain or discomfort that is severe or gets worse throughout the day.      Diverticulosis throughout your colon.  No polyps.  Extensive biopsies taken  Further recommendations to follow pending review of pathology report  Office visit with Charmaine Robins in 4 to 6 weeks   at patient request,  I called Antionio Negron, son, [phone] findings and recommendations "

## 2024-09-07 NOTE — Anesthesia Preprocedure Evaluation (Signed)
"                                    Anesthesia Evaluation  Patient identified by MRN, date of birth, ID band Patient awake    Reviewed: Allergy  & Precautions, H&P , NPO status , Patient's Chart, lab work & pertinent test results, reviewed documented beta blocker date and time   History of Anesthesia Complications (+) DIFFICULT AIRWAY and history of anesthetic complications  Airway Mallampati: IV  TM Distance: >3 FB Neck ROM: full    Dental no notable dental hx.    Pulmonary shortness of breath, asthma , sleep apnea , pneumonia, COPD, Current Smoker   Pulmonary exam normal breath sounds clear to auscultation       Cardiovascular Exercise Tolerance: Good hypertension, + CAD, + Past MI and +CHF  + Valvular Problems/Murmurs  Rhythm:regular Rate:Normal     Neuro/Psych  Headaches PSYCHIATRIC DISORDERS Anxiety Depression    CVA    GI/Hepatic Neg liver ROS,GERD  ,,  Endo/Other  diabetes    Renal/GU negative Renal ROS  negative genitourinary   Musculoskeletal   Abdominal   Peds  Hematology  (+) Blood dyscrasia, anemia   Anesthesia Other Findings   Reproductive/Obstetrics negative OB ROS                              Anesthesia Physical Anesthesia Plan  ASA: 3  Anesthesia Plan: MAC   Post-op Pain Management:    Induction:   PONV Risk Score and Plan: Propofol  infusion  Airway Management Planned:   Additional Equipment:   Intra-op Plan:   Post-operative Plan:   Informed Consent: I have reviewed the patients History and Physical, chart, labs and discussed the procedure including the risks, benefits and alternatives for the proposed anesthesia with the patient or authorized representative who has indicated his/her understanding and acceptance.     Dental Advisory Given  Plan Discussed with: CRNA  Anesthesia Plan Comments:         Anesthesia Quick Evaluation  "

## 2024-09-07 NOTE — Op Note (Signed)
 [facility] Patient Name: Randy Hebert Procedure Date: 09/07/2024 11:21 AM MRN: [id number] Date of Birth: 23-Sep-1965 Attending MD: Lamar Ozell Hollingshead , MD, [id number] CSN: [id number] Age: 59 Admit Type: Outpatient Procedure:                Colonoscopy Indications:              Chronic diarrhea Providers:                Lamar Ozell Hollingshead, MD, Madelin Hunter, RN, Chad                            Wilson, Technician Referring MD:              Medicines:                Propofol  per Anesthesia Complications:            No immediate complications. Estimated Blood Loss:     Estimated blood loss was minimal. Procedure:                Pre-Anesthesia Assessment:                           - Prior to the procedure, a History and Physical                            was performed, and patient medications and                            allergies were reviewed. The patient's tolerance of                            previous anesthesia was also reviewed. The risks                            and benefits of the procedure and the sedation                            options and risks were discussed with the patient.                            All questions were answered, and informed consent                            was obtained. Prior Anticoagulants: The patient has                            taken no anticoagulant or antiplatelet agents. ASA                            Grade Assessment: III - A patient with severe                            systemic disease. After reviewing the risks and  benefits, the patient was deemed in satisfactory                            condition to undergo the procedure.                           After obtaining informed consent, the colonoscope                            was passed under direct vision. Throughout the                            procedure, the patient's blood pressure, pulse, and                            oxygen  saturations  were monitored continuously. The                            [id number] ([id number]) Colon was introduced through                            the anus and advanced to the 10 cm into the ileum.                            The colonoscopy was performed without difficulty.                            The patient tolerated the procedure well. The                            quality of the bowel preparation was adequate. The                            terminal ileum, ileocecal valve, appendiceal                            orifice, and rectum were photographed. Scope In: 11:48:59 AM Scope Out: 11:58:48 AM Scope Withdrawal Time: 0 hours 6 minutes 11 seconds  Total Procedure Duration: 0 hours 9 minutes 49 seconds  Findings:      Hemorrhoids were found on perianal exam.      Multiple medium-mouthed diverticula were found in the entire colon.       Random biopsies throughout the colon taken for histologic studythe       distal 10 cm of TI appeared entirely normal.      Non-bleeding external and internal hemorrhoids were found during       retroflexion. The hemorrhoids were moderate, medium-sized and Grade II       (internal hemorrhoids that prolapse but reduce spontaneously). Patient       had prominent rectal veins. Impression:               - Hemorrhoids found on perianal exam. Prominent                            rectal veins.                           -  Diverticulosis in the entire examined colon.                           - Non-bleeding external and internal hemorrhoids.                           - Normal distal 10 cm of TI. Status post random                            colonic biopsy Moderate Sedation:      Moderate (conscious) sedation was personally administered by an       anesthesia professional. The following parameters were monitored: oxygen        saturation, heart rate, blood pressure, respiratory rate, EKG, adequacy       of pulmonary ventilation, and response to care. Recommendation:            - Patient has a contact number available for                            emergencies. The signs and symptoms of potential                            delayed complications were discussed with the                            patient. Return to normal activities tomorrow.                            Written discharge instructions were provided to the                            patient.                           - Advance diet as tolerated.                           - Continue present medications.                           - Repeat colonoscopy after studies are complete for                            screening purposes.                           - Return to GI office in 4 weeks. Procedure Code(s):        --- Professional ---                           [phone], Colonoscopy, flexible; diagnostic, including                            collection of specimen(s) by brushing or washing,  when performed (separate procedure) Diagnosis Code(s):        --- Professional ---                           K64.1, Second degree hemorrhoids                           K52.9, Noninfective gastroenteritis and colitis,                            unspecified                           K57.30, Diverticulosis of large intestine without                            perforation or abscess without bleeding CPT copyright 2022 American Medical Association. All rights reserved. The codes documented in this report are preliminary and upon coder review may  be revised to meet current compliance requirements. Lamar HERO. Wai Litt, MD Lamar Ozell Hollingshead, MD 09/07/2024 12:08:56 PM This report has been signed electronically. Number of Addenda: 0

## 2024-09-07 NOTE — H&P (Signed)
 @LOGO @   Gastroenterology Progress Note    Primary Care Physician:  Bluford Jacqulyn MATSU, DO Primary Gastroenterologist:  Dr. Shaaron  Pre-Procedure History & Physical: HPI:  Randy Hebert is a 59 y.o. male here for  further evaluation of chronic diarrhea via colonoscopy.  Past Medical History:  Diagnosis Date   ACE inhibitor-aggravated angioedema    Allergy     Anemia    Angio-edema    Anxiety    Arthritis    Asthma    hip replacement   Back pain    Bradycardia 04/28/2017   Bulging of cervical intervertebral disc    CAD (coronary artery disease)    lateral STEMI 02/06/2015 00% D1 occlusion treated with Promus Premier 2.5 mm x 16 mm DES, 70% ramus stenosis, 40% mid RCA stenosis, 45% distal RCA stenosis, EF 45-50%   CHF (congestive heart failure) (HCC)    COPD (chronic obstructive pulmonary disease) (HCC)    Depression    Diabetes mellitus without complication (HCC)    Difficult intubation    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   Dry eye    Dyspnea    Early satiety 09/23/2016   Fatty liver    GERD (gastroesophageal reflux disease)    HCAP (healthcare-associated pneumonia) 05/15/2017   Headache    Heart murmur    Hip pain    Hyperlipidemia    Hypertension    Lobar pneumonia 05/15/2017   Melena 08/04/2018   MI (myocardial infarction) [facility])    Myocardial infarction (HCC)    Neck pain    Non-ST elevation (NSTEMI) myocardial infarction (HCC) 04/27/2017   NSTEMI (non-ST elevated myocardial infarction) (HCC) 04/26/2017   Otitis media    Pleurisy    Pneumonia due to COVID-19 virus 10/07/2019   Rectal bleeding 11/08/2018   Right shoulder pain 03/20/2020   Sinus pause    9 sec sinus pause on telemetry after started on coreg  after MI, avoid AV nodal blocking agent   Sleep apnea    uses a cpap   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   STEMI (ST elevation myocardial infarction) (HCC) 05/31/2015   Substance abuse (HCC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   Syncope  06/29/2017   Syncope and collapse 05/15/2017   Transaminitis 08/04/2018   Unilateral primary osteoarthritis, right hip 07/01/2016    Past Surgical History:  Procedure Laterality Date   ARTERY EXPLORATION Left 03/24/2024   Procedure: LEFT ARCH AORTOGRAM, LEFT UPPER EXTREMITY ANGIOGRAM WITH FIRST ORDER CANNULATION, LEFT AXILLARY ANGIOPLASTY AND STENTING, EVACUATION OF LEFT AXILLARY HEMATOMA;  Surgeon: Magda Debby SAILOR, MD;  Location: [facility] OR;  Service: Vascular;  Laterality: Left;   ARTERY REPAIR Left 03/11/2024   Procedure: REPAIR LEFT AXILLARY ARTERY;  Surgeon: Serene Gaile ORN, MD;  Location: [facility] OR;  Service: Vascular;  Laterality: Left;  ARTERIAL REPAIR, LEFT AXILLARY   BIOPSY  10/09/2016   Procedure: BIOPSY;  Surgeon: Lamar CHRISTELLA Shaaron, MD;  Location: [facility] ENDO SUITE;  Service: Endoscopy;;   BIOPSY  11/28/2019   Procedure: BIOPSY;  Surgeon: Shaaron Lamar CHRISTELLA, MD;  Location: [facility];  Service: Endoscopy;;   BIOPSY  06/15/2023   Procedure: BIOPSY;  Surgeon: Shaaron Lamar CHRISTELLA, MD;  Location: [facility];  Service: Endoscopy;;   BRONCHIAL BIOPSY  10/16/2022   Procedure: BRONCHIAL BIOPSIES;  Surgeon: Gladis Leonor CHRISTELLA, MD;  Location: [facility] ENDOSCOPY;  Service: Pulmonary;;   BRONCHIAL NEEDLE ASPIRATION BIOPSY  10/16/2022   Procedure: BRONCHIAL NEEDLE ASPIRATION BIOPSIES;  Surgeon: Gladis,  Leonor HERO, MD;  Location: [facility] ENDOSCOPY;  Service: Pulmonary;;   BRONCHIAL WASHINGS  10/16/2022   Procedure: BRONCHIAL WASHINGS;  Surgeon: Gladis Leonor HERO, MD;  Location: [facility] ENDOSCOPY;  Service: Pulmonary;;   CARDIAC CATHETERIZATION N/A 02/06/2015   Procedure: Left Heart Cath and Coronary Angiography;  Surgeon: Alm LELON Clay, MD;  Location: [facility] INVASIVE CV LAB;  Service: Cardiovascular;  Laterality: N/A;   CARDIAC CATHETERIZATION N/A 02/06/2015   Procedure: Coronary Stent Intervention;  Surgeon: Alm LELON Clay, MD;  Location: [facility] INVASIVE CV LAB;  Service: Cardiovascular;  Laterality: N/A;   COLONOSCOPY WITH  PROPOFOL  N/A 10/09/2016   Sigmoid and descending colon diverticulosis, four 4-6 mm polyps in sigmoid, one 4 mm polyp in descending. Tubular adenomas and hyperplastic. 5 year surveillance.    COLONOSCOPY WITH PROPOFOL  N/A 11/24/2016   Sigmoid and descending colon diverticulosis, four 4-6 mm polyps in sigmoid, one 4 mm polyp in descending. Tubular adenomas and hyperplastic. 5 year surveillance.    COLONOSCOPY WITH PROPOFOL  N/A 10/18/2021   Carver: nonbleeding internal hemorrhoids, small and large mouth diverticula found in the sigmoid, descending, transverse colon.  A 9 mm sessile polyp was found in the ascending colon that was removed.  4 sessile polyps found in the sigmoid, descending, transverse colon 3 to 5 mm in size, examining otherwise.  Path revealed tubular adenomas.  Repeat due in 5 years for surveillance.   CORONARY ANGIOPLASTY WITH STENT PLACEMENT  01/2015   CORONARY STENT INTERVENTION N/A 04/27/2017   Procedure: CORONARY STENT INTERVENTION;  Surgeon: Mady Bruckner, MD;  Location: [facility] INVASIVE CV LAB;  Service: Cardiovascular;  Laterality: N/A;   ELECTROPHYSIOLOGY STUDY N/A 06/29/2017   Procedure: ELECTROPHYSIOLOGY STUDY;  Surgeon: Waddell Danelle LELON, MD;  Location: [facility] INVASIVE CV LAB;  Service: Cardiovascular;  Laterality: N/A;   ESOPHAGOGASTRODUODENOSCOPY (EGD) WITH PROPOFOL  N/A 10/09/2016   Dr. Shaaron: LA grade a esophagitis.  Barrett's esophagus, biopsy-proven.  Small hiatal hernia.  EGD February 2019   ESOPHAGOGASTRODUODENOSCOPY (EGD) WITH PROPOFOL  N/A 11/28/2019    salmon-colored esophageal mucosa (Barrett's) small hiatal hernia, portal hypertensive gastropathy, normal duodenum, 3 year surveillance   ESOPHAGOGASTRODUODENOSCOPY (EGD) WITH PROPOFOL  N/A 09/06/2021   three columns of grade 1 varices in distal esophagus, no stigmata of bleeding or red wale signs. Small hiatal hernia. Mild portal gastropathy. Normal duodenum. Repeat upper endoscopy in 1 year for surveillance.    ESOPHAGOGASTRODUODENOSCOPY (EGD) WITH PROPOFOL  N/A 06/15/2023   Procedure: ESOPHAGOGASTRODUODENOSCOPY (EGD) WITH PROPOFOL ;  Surgeon: Shaaron Lamar HERO, MD;  [facility];  Service: Endoscopy;  Laterality: N/A;  830am, asa 3   FIDUCIAL MARKER PLACEMENT  10/16/2022   Procedure: FIDUCIAL MARKER PLACEMENT;  Surgeon: Gladis Leonor HERO, MD;  Location: [facility] ENDOSCOPY;  Service: Pulmonary;;   INCISION / DRAINAGE HAND / FINGER     LEFT HEART CATH AND CORONARY ANGIOGRAPHY N/A 04/27/2017   Procedure: LEFT HEART CATH AND CORONARY ANGIOGRAPHY;  Surgeon: Mady Bruckner, MD;  Location: [facility] INVASIVE CV LAB;  Service: Cardiovascular;  Laterality: N/A;   LOOP RECORDER INSERTION  06/29/2017   Procedure: Loop Recorder Insertion;  Surgeon: Waddell Danelle LELON, MD;  Location: [facility] INVASIVE CV LAB;  Service: Cardiovascular;;   POLYPECTOMY  11/24/2016   Procedure: POLYPECTOMY;  Surgeon: Lamar HERO Shaaron, MD;  [facility];  Service: Endoscopy;;  descending and sigmoid   POLYPECTOMY  10/18/2021   Procedure: POLYPECTOMY;  Surgeon: Cindie Carlin POUR, DO;  Location: [facility];  Service: Endoscopy;;   TOTAL HIP ARTHROPLASTY Right 07/01/2016   TOTAL  HIP ARTHROPLASTY Right 07/01/2016   Procedure: RIGHT TOTAL HIP ARTHROPLASTY ANTERIOR APPROACH;  Surgeon: Lonni CINDERELLA Poli, MD;  Location: [facility] OR;  Service: Orthopedics;  Laterality: Right;   ULTRASOUND GUIDANCE FOR VASCULAR ACCESS Left 03/24/2024   Procedure: ULTRASOUND GUIDANCE, FOR VASCULAR ACCESS;  Surgeon: Magda Debby SAILOR, MD;  Location: [facility] OR;  Service: Vascular;  Laterality: Left;    Prior to Admission medications  Medication Sig Start Date End Date Taking? Authorizing Provider  acetaminophen  (TYLENOL ) 500 MG tablet Take 2 tablets (1,000 mg total) by mouth every 8 (eight) hours as needed for mild pain or fever. 09/06/21  Yes Jens Durand, MD  amLODipine  (NORVASC ) 10 MG tablet TAKE ONE TABLET BY MOUTH DAILY 07/05/24  Yes Branch, Dorn FALCON, MD  Ascorbic  Acid (VITAMIN C  PO) Take 1 tablet by mouth daily.   Yes [provider]  aspirin  EC 81 MG tablet Take 1 tablet (81 mg total) by mouth daily. Swallow whole. 03/03/24  Yes Shah, Pratik D, DO  atorvastatin  (LIPITOR ) 80 MG tablet Take 1 tablet (80 mg total) by mouth daily. 08/01/24  Yes BranchDorn FALCON, MD  Cholecalciferol  (VITAMIN D -3 PO) Take 1 capsule by mouth daily.   Yes [provider]  citalopram  (CELEXA ) 20 MG tablet Take 1 tablet (20 mg total) by mouth daily. 07/04/24  Yes Cook, Jayce G, DO  ezetimibe  (ZETIA ) 10 MG tablet TAKE ONE TABLET BY MOUTH DAILY 05/27/24  Yes Branch, Dorn FALCON, MD  MAGNESIUM  PO Take 1 tablet by mouth 2 (two) times daily.   Yes [provider]  pantoprazole  (PROTONIX ) 40 MG tablet TAKE (1) TABLET BY MOUTH TWICE DAILY BEFORE A MEAL. 01/11/24  Yes Reise Gladney, Lamar HERO, MD  pregabalin (LYRICA) 75 MG capsule Take 75 mg by mouth daily. 05/20/24 05/20/25 Yes [provider]  RESTASIS  0.05 % ophthalmic emulsion Place 1 drop into both eyes 2 (two) times daily. 02/26/24  Yes [provider]  SYMBICORT  160-4.5 MCG/ACT inhaler INHALE 2 PUFFS INTO THE LUNGS FIRST THING IN THE MORNING AND 2 PUFFS ABOUT 12 HOURS LATER. 12/17/23  Yes Cook, Jayce G, DO  clopidogrel  (PLAVIX ) 75 MG tablet Take 1 tablet (75 mg total) by mouth daily. 06/02/24   Alvan Dorn FALCON, MD  dapagliflozin  propanediol (FARXIGA ) 10 MG TABS tablet Take 1 tablet (10 mg total) by mouth daily. 05/27/24   Nida, Gebreselassie W, MD  Dulaglutide  (TRULICITY ) 1.5 MG/0.5ML SOAJ Inject 1.5 mg into the skin once a week. 05/27/24   Lenis Ethelle ORN, MD  Ferrous Sulfate  (IRON PO) Take 1 tablet by mouth daily.    [provider]  furosemide  (LASIX ) 40 MG tablet Take 1 tablet (40 mg total) by mouth daily as needed (excessive swelling). 06/02/24   Alvan Dorn FALCON, MD  nitroGLYCERIN  (NITROSTAT ) 0.4 MG SL tablet Place 1 tablet (0.4 mg total) under the tongue every 5 (five) minutes x 3 doses  as needed (if no relief after 3rd dose, proceed to ED or call 911). 06/02/24   Alvan Dorn FALCON, MD    Allergies as of 08/12/2024 - Review Complete 07/25/2024  Allergen Reaction Noted   Coreg  [carvedilol ] Other (See Comments) 02/08/2015   Zestril  [lisinopril ] Anaphylaxis, Shortness Of Breath, and Swelling 05/30/2015   Chantix  [varenicline ] Other (See Comments) 07/16/2021   Amoxil  [amoxicillin ] Nausea Only 02/06/2015    Family History  Problem Relation Age of Onset   Heart attack Father    Stroke Father    Arthritis Father    Heart disease Father  Cancer Mother        ???   Arthritis Mother    Heart disease Brother 85       died in sleep   Early death Brother    Diabetes Maternal Uncle    Alzheimer's disease Maternal Grandmother     Social History   Socioeconomic History   Marital status: Divorced    Spouse name: alice   Number of children: 3   Years of education: 12   Highest education level: Not on file  Occupational History   Occupation: SSI  Tobacco Use   Smoking status: Every Day    Current packs/day: 1.50    Average packs/day: 1.5 packs/day for 46.0 years (69.0 ttl pk-yrs)    Types: Cigarettes    Passive exposure: Never   Smokeless tobacco: Former    Types: Chew   Tobacco comments:    Smokes 1 pack a day of cigarettes (20 cig)10/16/23  Vaping Use   Vaping status: Former  Substance and Sexual Activity   Alcohol  use: Yes    Comment: rare beer use   Drug use: No    Comment: history of drug use- marijuana, cocaine- quit 2014   Sexual activity: Yes    Partners: Female    Birth control/protection: None  Other Topics Concern   Not on file  Social History Narrative   Lives with girlfriend [name]   3 children, but 1 OD  In 2019.   Grandchildren-2      Two cats: may and princess; and a dog-foxy      Enjoy: spending time with pets, TV, yard work       Diet: eats all food groups   Caffeine: half and half coffee, some tea-half and half decaf   Water : 6-8  cups daily      Wears seat belt   Does not use phone while driving   Smoke detectors at home      Social Drivers of Health   Tobacco Use: High Risk (09/07/2024)   Patient History    Smoking Tobacco Use: Every Day    Smokeless Tobacco Use: Former    Passive Exposure: Never  Physicist, Medical Strain: Low Risk (01/08/2024)   Overall Financial Resource Strain (CARDIA)    Difficulty of Paying Living Expenses: Not hard at all  Food Insecurity: No Food Insecurity (03/24/2024)   Epic    Worried About Radiation Protection Practitioner of Food in the Last Year: Never true    Ran Out of Food in the Last Year: Never true  Transportation Needs: No Transportation Needs (03/24/2024)   Epic    Lack of Transportation (Medical): No    Lack of Transportation (Non-Medical): No  Physical Activity: Insufficiently Active (01/08/2024)   Exercise Vital Sign    Days of Exercise per Week: 2 days    Minutes of Exercise per Session: 60 min  Stress: No Stress Concern Present (01/08/2024)   [employer] of Occupational Health - Occupational Stress Questionnaire    Feeling of Stress : Not at all  Social Connections: Moderately Integrated (03/02/2024)   Social Connection and Isolation Panel    Frequency of Communication with Friends and Family: More than three times a week    Frequency of Social Gatherings with Friends and Family: Once a week    Attends Religious Services: More than 4 times per year    Active Member of [employer] or Organizations: Yes    Attends Banker Meetings: More than 4 times per year  Marital Status: Divorced  Catering Manager Violence: Not At Risk (03/24/2024)   Epic    Fear of Current or Ex-Partner: No    Emotionally Abused: No    Physically Abused: No    Sexually Abused: No  Depression (PHQ2-9): Medium Risk (04/21/2024)   Depression (PHQ2-9)    PHQ-2 Score: 7  Alcohol  Screen: Medium Risk (01/08/2024)   Alcohol  Screen    Last Alcohol  Screening Score (AUDIT): 9  Housing: Low Risk (03/24/2024)    Epic    Unable to Pay for Housing in the Last Year: No    Number of Times Moved in the Last Year: 0    Homeless in the Last Year: No  Utilities: Not At Risk (03/24/2024)   Epic    Threatened with loss of utilities: No  Health Literacy: Adequate Health Literacy (01/08/2024)   B1300 Health Literacy    Frequency of need for help with medical instructions: Never    Review of Systems   See HPI, otherwise negative ROS  Physical Exam: BP 129/86 (BP Location: Left Arm)   Temp 97.7 F (36.5 C)   Resp 20   SpO2 94%  General:   Alert,  Well-developed, well-nourished, pleasant and cooperative in NAD Neck:  Supple; no masses or thyromegaly. No significant cervical adenopathy. Lungs:  Clear throughout to auscultation.   No wheezes, crackles, or rhonchi. No acute distress. Heart:  Regular rate and rhythm; no murmurs, clicks, rubs,  or gallops. Abdomen: Non-distended, normal bowel sounds.  Soft and nontender without appreciable mass or hepatosplenomegaly.  Impression/Plan:  `  59 year old gentleman here for further evaluation chronic diarrhea via colonoscopy.  The risks, benefits, limitations, alternatives and imponderables have been reviewed with the patient. Questions have been answered. All parties are agreeable.       Notice: This dictation was prepared with Dragon dictation along with smaller phrase technology. Any transcriptional errors that result from this process are unintentional and may not be corrected upon review.

## 2024-09-08 ENCOUNTER — Ambulatory Visit: Attending: Family Medicine | Admitting: Physical Therapy

## 2024-09-08 ENCOUNTER — Encounter: Payer: Self-pay | Admitting: Physical Therapy

## 2024-09-08 DIAGNOSIS — M25512 Pain in left shoulder: Secondary | ICD-10-CM | POA: Insufficient documentation

## 2024-09-08 DIAGNOSIS — M6281 Muscle weakness (generalized): Secondary | ICD-10-CM | POA: Diagnosis present

## 2024-09-08 LAB — SURGICAL PATHOLOGY

## 2024-09-08 NOTE — Therapy (Signed)
 " OUTPATIENT PHYSICAL THERAPY TREATMENT   Patient Name: Randy Hebert MRN: [id number] DOB:[birth year], 59 y.o., male Today's Date: 09/08/2024  END OF SESSION:  PT End of Session - 09/08/24 0845     Visit Number 18    Number of Visits 24    Date for Recertification  10/11/24    Progress Note Due on Visit 20    PT Start Time 0845    PT Stop Time 0925    PT Time Calculation (min) 40 min    Activity Tolerance Patient tolerated treatment well    Behavior During Therapy [facility] for tasks assessed/performed             Past Medical History:  Diagnosis Date   ACE inhibitor-aggravated angioedema    Allergy     Anemia    Angio-edema    Anxiety    Arthritis    Asthma    hip replacement   Back pain    Bradycardia 04/28/2017   Bulging of cervical intervertebral disc    CAD (coronary artery disease)    lateral STEMI 02/06/2015 00% D1 occlusion treated with Promus Premier 2.5 mm x 16 mm DES, 70% ramus stenosis, 40% mid RCA stenosis, 45% distal RCA stenosis, EF 45-50%   CHF (congestive heart failure) (HCC)    COPD (chronic obstructive pulmonary disease) (HCC)    Depression    Diabetes mellitus without complication (HCC)    Difficult intubation    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   Dry eye    Dyspnea    Early satiety 09/23/2016   Fatty liver    GERD (gastroesophageal reflux disease)    HCAP (healthcare-associated pneumonia) 05/15/2017   Headache    Heart murmur    Hip pain    Hyperlipidemia    Hypertension    Lobar pneumonia 05/15/2017   Melena 08/04/2018   MI (myocardial infarction) (HCC)    Myocardial infarction (HCC)    Neck pain    Non-ST elevation (NSTEMI) myocardial infarction (HCC) 04/27/2017   NSTEMI (non-ST elevated myocardial infarction) (HCC) 04/26/2017   Otitis media    Pleurisy    Pneumonia due to COVID-19 virus 10/07/2019   Rectal bleeding 11/08/2018   Right shoulder pain 03/20/2020   Sinus pause    9 sec sinus pause on telemetry after  started on coreg  after MI, avoid AV nodal blocking agent   Sleep apnea    uses a cpap   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   STEMI (ST elevation myocardial infarction) (HCC) 05/31/2015   Substance abuse (HCC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   Syncope 06/29/2017   Syncope and collapse 05/15/2017   Transaminitis 08/04/2018   Unilateral primary osteoarthritis, right hip 07/01/2016   Past Surgical History:  Procedure Laterality Date   ARTERY EXPLORATION Left 03/24/2024   Procedure: LEFT ARCH AORTOGRAM, LEFT UPPER EXTREMITY ANGIOGRAM WITH FIRST ORDER CANNULATION, LEFT AXILLARY ANGIOPLASTY AND STENTING, EVACUATION OF LEFT AXILLARY HEMATOMA;  Surgeon: Magda Debby SAILOR, MD;  Location: [facility] OR;  Service: Vascular;  Laterality: Left;   ARTERY REPAIR Left 03/11/2024   Procedure: REPAIR LEFT AXILLARY ARTERY;  Surgeon: Serene Gaile ORN, MD;  Location: [facility] OR;  Service: Vascular;  Laterality: Left;  ARTERIAL REPAIR, LEFT AXILLARY   BIOPSY  10/09/2016   Procedure: BIOPSY;  Surgeon: Lamar CHRISTELLA Hollingshead, MD;  Location: [facility] ENDO SUITE;  Service: Endoscopy;;   BIOPSY  11/28/2019   Procedure: BIOPSY;  Surgeon: Hollingshead Lamar CHRISTELLA, MD;  Location: [facility] END[facility]  SUITE;  Service: Endoscopy;;   BIOPSY  06/15/2023   Procedure: BIOPSY;  Surgeon: Shaaron Lamar HERO, MD;  Location: [facility];  Service: Endoscopy;;   BRONCHIAL BIOPSY  10/16/2022   Procedure: BRONCHIAL BIOPSIES;  Surgeon: Gladis Leonor HERO, MD;  Location: [facility] ENDOSCOPY;  Service: Pulmonary;;   BRONCHIAL NEEDLE ASPIRATION BIOPSY  10/16/2022   Procedure: BRONCHIAL NEEDLE ASPIRATION BIOPSIES;  Surgeon: Gladis Leonor HERO, MD;  Location: [facility] ENDOSCOPY;  Service: Pulmonary;;   BRONCHIAL WASHINGS  10/16/2022   Procedure: BRONCHIAL WASHINGS;  Surgeon: Gladis Leonor HERO, MD;  Location: [facility] ENDOSCOPY;  Service: Pulmonary;;   CARDIAC CATHETERIZATION N/A 02/06/2015   Procedure: Left Heart Cath and Coronary Angiography;  Surgeon: Alm LELON Clay, MD;  Location: [facility] INVASIVE CV LAB;   Service: Cardiovascular;  Laterality: N/A;   CARDIAC CATHETERIZATION N/A 02/06/2015   Procedure: Coronary Stent Intervention;  Surgeon: Alm LELON Clay, MD;  Location: [facility] INVASIVE CV LAB;  Service: Cardiovascular;  Laterality: N/A;   COLONOSCOPY WITH PROPOFOL  N/A 10/09/2016   Sigmoid and descending colon diverticulosis, four 4-6 mm polyps in sigmoid, one 4 mm polyp in descending. Tubular adenomas and hyperplastic. 5 year surveillance.    COLONOSCOPY WITH PROPOFOL  N/A 11/24/2016   Sigmoid and descending colon diverticulosis, four 4-6 mm polyps in sigmoid, one 4 mm polyp in descending. Tubular adenomas and hyperplastic. 5 year surveillance.    COLONOSCOPY WITH PROPOFOL  N/A 10/18/2021   Carver: nonbleeding internal hemorrhoids, small and large mouth diverticula found in the sigmoid, descending, transverse colon.  A 9 mm sessile polyp was found in the ascending colon that was removed.  4 sessile polyps found in the sigmoid, descending, transverse colon 3 to 5 mm in size, examining otherwise.  Path revealed tubular adenomas.  Repeat due in 5 years for surveillance.   CORONARY ANGIOPLASTY WITH STENT PLACEMENT  01/2015   CORONARY STENT INTERVENTION N/A 04/27/2017   Procedure: CORONARY STENT INTERVENTION;  Surgeon: Mady Bruckner, MD;  Location: [facility] INVASIVE CV LAB;  Service: Cardiovascular;  Laterality: N/A;   ELECTROPHYSIOLOGY STUDY N/A 06/29/2017   Procedure: ELECTROPHYSIOLOGY STUDY;  Surgeon: Waddell Danelle LELON, MD;  Location: [facility] INVASIVE CV LAB;  Service: Cardiovascular;  Laterality: N/A;   ESOPHAGOGASTRODUODENOSCOPY (EGD) WITH PROPOFOL  N/A 10/09/2016   Dr. Shaaron: LA grade a esophagitis.  Barrett's esophagus, biopsy-proven.  Small hiatal hernia.  EGD February 2019   ESOPHAGOGASTRODUODENOSCOPY (EGD) WITH PROPOFOL  N/A 11/28/2019    salmon-colored esophageal mucosa (Barrett's) small hiatal hernia, portal hypertensive gastropathy, normal duodenum, 3 year surveillance   ESOPHAGOGASTRODUODENOSCOPY (EGD) WITH  PROPOFOL  N/A 09/06/2021   three columns of grade 1 varices in distal esophagus, no stigmata of bleeding or red wale signs. Small hiatal hernia. Mild portal gastropathy. Normal duodenum. Repeat upper endoscopy in 1 year for surveillance.   ESOPHAGOGASTRODUODENOSCOPY (EGD) WITH PROPOFOL  N/A 06/15/2023   Procedure: ESOPHAGOGASTRODUODENOSCOPY (EGD) WITH PROPOFOL ;  Surgeon: Shaaron Lamar HERO, MD;  Location: [facility];  Service: Endoscopy;  Laterality: N/A;  830am, asa 3   FIDUCIAL MARKER PLACEMENT  10/16/2022   Procedure: FIDUCIAL MARKER PLACEMENT;  Surgeon: Gladis Leonor HERO, MD;  Location: [facility] ENDOSCOPY;  Service: Pulmonary;;   INCISION / DRAINAGE HAND / FINGER     LEFT HEART CATH AND CORONARY ANGIOGRAPHY N/A 04/27/2017   Procedure: LEFT HEART CATH AND CORONARY ANGIOGRAPHY;  Surgeon: Mady Bruckner, MD;  Location: [facility] INVASIVE CV LAB;  Service: Cardiovascular;  Laterality: N/A;   LOOP RECORDER INSERTION  06/29/2017   Procedure: Loop Recorder Insertion;  Surgeon: Waddell Danelle LELON, MD;  Location: [facility] INVASIVE CV LAB;  Service: Cardiovascular;;   POLYPECTOMY  11/24/2016   Procedure: POLYPECTOMY;  Surgeon: Lamar CHRISTELLA Hollingshead, MD;  Location: [facility];  Service: Endoscopy;;  descending and sigmoid   POLYPECTOMY  10/18/2021   Procedure: POLYPECTOMY;  Surgeon: Cindie Carlin POUR, DO;  Location: [facility];  Service: Endoscopy;;   TOTAL HIP ARTHROPLASTY Right 07/01/2016   TOTAL HIP ARTHROPLASTY Right 07/01/2016   Procedure: RIGHT TOTAL HIP ARTHROPLASTY ANTERIOR APPROACH;  Surgeon: Lonni CINDERELLA Poli, MD;  Location: [facility] OR;  Service: Orthopedics;  Laterality: Right;   ULTRASOUND GUIDANCE FOR VASCULAR ACCESS Left 03/24/2024   Procedure: ULTRASOUND GUIDANCE, FOR VASCULAR ACCESS;  Surgeon: Magda Debby SAILOR, MD;  Location: [facility] OR;  Service: Vascular;  Laterality: Left;   Patient Active Problem List   Diagnosis Date Noted   Pain of left upper extremity 04/24/2024   Acute postoperative anemia due to greater  than expected blood loss 03/26/2024   Obesity, class 1 03/26/2024   Aneurysm of axillary artery 03/24/2024   Axillary artery injury 03/12/2024   Acute CVA (cerebrovascular accident) (HCC) 03/02/2024   Essential hypertension 03/02/2024   Dyslipidemia 03/02/2024   Type 2 diabetes mellitus with hyperlipidemia (HCC) 03/02/2024   Erectile dysfunction 02/24/2023   Chronic eczematous otitis externa of both ears 02/05/2023   Tobacco abuse 12/25/2022   Malignant neoplasm of upper lobe of right lung (HCC) 09/25/2022   Cervical spondylosis with myelopathy 08/01/2022   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   DDD (degenerative disc disease), cervical 05/21/2022   Osteoarthritis of both hands 05/19/2022   History of GI bleed 11/13/2021   Insomnia 09/13/2021   Cirrhosis of liver (HCC) 11/08/2018   Barrett's esophagus 08/04/2018   Vitamin D  deficiency 06/30/2018   Alcohol  abuse 06/30/2018   DM type 2 causing vascular disease (HCC) 08/17/2017   Chronic combined systolic and diastolic CHF (congestive heart failure) (HCC) 05/15/2017   OSA (obstructive sleep apnea) 04/28/2017   Depression with anxiety 04/15/2017   GERD (gastroesophageal reflux disease) 09/23/2016   Asthmatic bronchitis , chronic (HCC) 08/27/2015   Mixed hyperlipidemia 08/27/2015   Class 1 obesity due to excess calories with serious comorbidity and body mass index (BMI) of 32.0 to 32.9 in adult 08/27/2015   CAD (coronary artery disease) 05/31/2015   Essential hypertension, benign 02/09/2015    PCP: Cook, Jayce G, DO   REFERRING PROVIDER: Noralee Elidia Sieving*   REFERRING DIAG: left axillary pseudoaneurysm   Rationale for Evaluation and Treatment:  Rehabiliation  THERAPY DIAG:  Muscle weakness (generalized)  Acute pain of left shoulder  ONSET DATE: 03/11/24   SUBJECTIVE:  SUBJECTIVE STATEMENT: He says pain in his right arm today 8/10. His left arm is not having pain but has a spot on his forearm that hurts if he pushes on it. Feels overall he is naturally able to use his left arm more now for daily tasks such as folding laundry, and washing dishes.    PERTINENT HISTORY:  past medical history of diabetes mellitus type 2, hypertension, CAD, liver cirrhosis, anxiety, depression, OSA, CVA w/ left-sided weakness who had a recent hospitalization 7/11 to 03/19/24 post traumatic left shoulder dislocation, complicated with left axillary hematoma with left axillary pseudoaneurysm. On 07/12 he underwent open exposure and repair of the left axillary pseudoaneurysm and evacuation of left axillary hematoma.   PAIN:  NPRS scale: 1/10 Pain location:left shoulder/axillary, left forearm and wrist Pain description: constant, achy, sharp Aggravating factors: any use of this left arm. Relieving factors: rest, meds   PRECAUTIONS: ,  Shoulder  RED FLAGS: None   WEIGHT BEARING RESTRICTIONS:  Not anymore  FALLS:  Has patient fallen in last 6 months? Yes. Number of falls 1   OCCUPATION:  disabled  PLOF:  Independent with basic ADLs  PATIENT GOALS:  Reduce pain and get his arm back to normal  OBJECTIVE:  Note: Objective measures were completed at Evaluation unless otherwise noted.  DIAGNOSTIC FINDINGS:  Chest CT IMPRESSION: 1. Enlarging left axillary hematoma with arterial contrast extravasation from the posterior wall of the left axillary artery into a 1.5 x 1.8 cm pseudoaneurysm, with additional 1.8 x 1.1 cm pocket of contrast more posteriorly within the collection. 2. Increased heterogeneous hematoma in the left subscapularis region measuring 7.3 x 4.8 cm, but no focal contrast is seen within this. 3. Emphysema. 4. Right apical fiducial markers and post XRT changes, with treated nodule just above the fiducial markers today  measuring 8 mm, previously 9 mm. 5. Aortic and coronary artery atherosclerosis.  Right aortic arch. 6. Enlarged pulmonary trunk 3.3 cm indicating arterial hypertension. 7. Cirrhosis and splenomegaly.  Prominent patent portal vein 1.7 cm. 8. Critical Value/emergent results were called by telephone at the time of interpretation on 03/24/2024 at 5:18 am to provider [facility] , who verbally acknowledged these results.  Shoulder XR IMPRESSION: No acute bony abnormality.   Postoperative changes in the left axilla.  No imaging for left elbow/wrist  06/03/24 EMG & NCS Examination  Impression: 1. This is an abnormal electrodiagnostic study of the left upper extremity 2. There is electrodiagnostic evidence of a diffuse, subacute brachial plexopathy affecting the upper, middle, and lower trunks of the left upper extremity with widespread denervation changes indicating ongoing axonal injury   PATIENT SURVEYS:  Patient-Specific Activity Scoring Scheme  0 represents unable to perform. 10 represents able to perform at prior level. 0 1 2 3 4 5 6 7 8 9  10 (Date and Score)   Activity Eval  06/21/24  1. Raising left arm  0 0   2. Putting on and off shirt 1  8  3.     4.    5.    Score 0.5/10 4/10   Total score = sum of the activity scores/number of activities Minimum detectable change (90%CI) for average score = 2 points Minimum detectable change (90%CI) for single activity score = 3 points     EDEMA:  Yes: moderate edema noted in left upper extremity from shoulder to hand  Observation:  2 incisions noted, one at proximal chest, one at axilla, both incisions closed with stitches and appear closed  without any drainage or signs of infection at the moment. Dressing was removed and changed for him at eval.     UPPER EXTREMITY ROM:  AROM/Passive ROM Left eval Left 04/13/24 Left 05/24/24 Left 06/21/24 Left 08/16/24  Shoulder flexion /50 /120 30/150 40/ 40/WFL  Shoulder  extension       Shoulder abduction /50 /100 20/140 22/ 22/WFL  Shoulder adduction       Shoulder extension       Shoulder internal rotation /50    L2 behind back in standing /  Shoulder external rotation /40 /70   To ear in standing/  Elbow flexion       Elbow extension       Wrist flexion       Wrist extension       Wrist ulnar deviation       Wrist radial deviation       Wrist pronation       Wrist supination        (Blank rows = not tested)   UPPER EXTREMITY MMT:  MMT Left eval Left 05/24/24 Left 06/21/24 Left 07/19/24 Left 08/16/24  Shoulder flexion 1 2 2 2 2   Shoulder extension       Shoulder abduction 1 2 2 2 2   Shoulder adduction       Shoulder extension       Shoulder internal rotation 1 2 4 4 4   Shoulder external rotation 1 2 3  3+ 3+  Middle trapezius       Lower trapezius       Elbow flexion 1 3 4 4 4   Elbow extension 2 3 4 4 4   Wrist flexion       Wrist extension       Wrist ulnar deviation       Wrist radial deviation       Wrist pronation       Wrist supination       Grip strength     3   (Blank rows = not tested)                                                                                                                              TREATMENT DATE:  09/09/23 Theractivity (ROM and strength for reaching, push/pull, gripping) UBE L2 X 10 min , push/pull Wall ladder X 10 reps flexion, X 5 reps scaption Pulleys X 15 reps flexion with slow eccentrics Standing deltoid flexion isometric pushing into green ball 5 sec X 10, and abduction isometric 5 sec X 10 Seated bicep curls 5# X 5 reps, then 3# X 10 reps Standing chest press green X 15 Standing IR green X 15 Standing shoulder adduction Green X 15 Standing shoulder row Green X 15 Standing shoulder extension Green X 15 Standing ER Green X 15 Supine shoulder flexion AAROM with cane X 10 with slow eccentrics Supine shoulder abduction AAROM with cane X 10 with slow eccentrics  Supine shoulder ER AAROM X  10 reps  08/31/24 Theractivity (ROM and strength for reaching, push/pull, gripping) UBE L3 X 10 min , able to maintain grip the whole 10 min now Wall ladder X 10 reps flexion, X 4 reps scaption Pulleys X 15 reps flexion with slow eccentrics Standing deltoid flexion isometric pushing into green ball 5 sec X 10, and abduction isometric 5 sec X 10 Standing p ball roll outs on mat table with big blue Pball for AAROM flexion, abduction, circles CW & CCW X 15 reps each for reaching Gripping ace bandage X 25 Standing bicep curls 2X10  with green band around hand Standing chest press green X 15 Standing IR green X 15 Standing shoulder adduction Green X 15 Standing shoulder row Green X 15 Standing shoulder extension Green X 15 Standing ER Green X 15 Supine shoulder flexion AAROM with cane X 10 with slow eccentrics Supine shoulder abduction AAROM with cane X 10 with slow eccentrics    PATIENT EDUCATION: Education details: HEP, PT plan of care, selfcare Person educated: Patient Education method: Explanation, Demonstration, Verbal cues, and Handouts Education comprehension: verbalized understanding, further education recommended   HOME EXERCISE PROGRAM: Access Code: [email or web address] URL: [email or web address] Date: 03/31/2024 Prepared by: Redell Moose  Exercises - Circular Shoulder Pendulum with Table Support  - 2-3 x daily - 7 x weekly - 1 sets - 15 reps - Seated Shoulder Abduction Towel Slide at Table Top  - 2-3 x daily - 6 x weekly - 1 sets - 10 reps - 5 sec hold - Seated Shoulder Flexion Towel Slide at Table Top  - 2-3 x daily - 6 x weekly - 1 sets - 10 reps - 5 sec hold - Seated Shoulder External Rotation PROM on Table  - 2-3 x daily - 6 x weekly - 1 sets - 10 reps - 5 sec hold - Seated Gripping Towel  - 2-3 x daily - 6 x weekly - 1 sets - 20 reps - Standing Elbow Flexion Extension AROM  - 2-3 x daily - 6 x weekly - 1 sets - 10 reps  ASSESSMENT: Continued efforts on  strength and functional use of left UE. He is slowly making some progress but remains limited and will continue to benefit from PT.  OBJECTIVE IMPAIRMENTS: decreased activity tolerance for ADL's, difficulty walking, decreased balance, decreased endurance, decreased mobility, decreased ROM, decreased strength, impaired flexibility, impaired UE use, and pain.  ACTIVITY LIMITATIONS: bending, lifting, carry, reaching, locomotion, cleaning, community activity, driving,  PERSONAL FACTORS: see above PMH  also affecting patient's functional outcome.  REHAB POTENTIAL: Good  CLINICAL DECISION MAKING: Evolving/moderate complexity  EVALUATION COMPLEXITY: Moderate    GOALS: Short term PT Goals Target date: 04/28/2024   Pt will be I and compliant with HEP. Baseline:  Goal status: MET 04/18/24 Pt will decrease pain by 25% overall Baseline: Goal status: MET 04/18/24  Long term PT goals Target date:06/23/2024  Pt will improve right shoulder and elbow AROM to [facility] to improve functional mobility Baseline: Goal status: ongoing 09/08/24 Pt will improve  right arm strength to at least 4+/5 MMT to improve functional strength Baseline: Goal status: ongoing 09/08/24 Pt will improve Patient specific functional scale (PSFS) to at least 7/10 to show improved function level Baseline: Goal status: ongoing 09/08/24 Pt will reduce pain to overall less than 3/10 with usual activity Baseline: Goal status: ongoing 09/08/24 PLAN: PT FREQUENCY: 1-3 times per week   PT DURATION: 6-8 weeks  PLANNED INTERVENTIONS (unless contraindicated):  97110-Therapeutic exercises, 97530- Therapeutic activity, [id number]- Neuromuscular re-education, 97535- Self Care, [id number]- Manual therapy, [id number]- Electrical stimulation (unattended), [id number]- Vasopneumatic device, [id number]- Ultrasound, and 97033- Ionotophoresis 4mg /ml Dexamethasone   PLAN FOR NEXT SESSION: progress strength and function as tolerated, compensatory strategies for deltoid  Redell JONELLE Moose, PT,DPT 09/08/2024, 9:24 AM   "

## 2024-09-09 NOTE — Anesthesia Postprocedure Evaluation (Signed)
"   Anesthesia Post Note  Patient: Randy Hebert  Procedure(s) Performed: COLONOSCOPY  Patient location during evaluation: Phase II Anesthesia Type: MAC Level of consciousness: awake Pain management: pain level controlled Vital Signs Assessment: post-procedure vital signs reviewed and stable Respiratory status: spontaneous breathing and respiratory function stable Cardiovascular status: blood pressure returned to baseline and stable Postop Assessment: no headache and no apparent nausea or vomiting Anesthetic complications: no Comments: Late entry   No notable events documented.   Last Vitals:  Vitals:   09/07/24 1206 09/07/24 1220  BP: (!) 157/128 117/87  Pulse: 68   Resp: 15   Temp: 36.7 C   SpO2: 93%     Last Pain:  Vitals:   09/07/24 1206  TempSrc: Oral  PainSc: Asleep                 Yvonna PARAS Leslie Langille      "

## 2024-09-11 ENCOUNTER — Ambulatory Visit: Payer: Self-pay | Admitting: Internal Medicine

## 2024-09-12 ENCOUNTER — Ambulatory Visit

## 2024-09-12 ENCOUNTER — Encounter: Payer: Self-pay | Admitting: Cardiology

## 2024-09-12 ENCOUNTER — Ambulatory Visit: Attending: Cardiology | Admitting: Cardiology

## 2024-09-12 VITALS — BP 124/68 | HR 79 | Ht 65.0 in | Wt 186.2 lb

## 2024-09-12 DIAGNOSIS — G464 Cerebellar stroke syndrome: Secondary | ICD-10-CM | POA: Diagnosis not present

## 2024-09-12 DIAGNOSIS — M25512 Pain in left shoulder: Secondary | ICD-10-CM

## 2024-09-12 DIAGNOSIS — I10 Essential (primary) hypertension: Secondary | ICD-10-CM

## 2024-09-12 DIAGNOSIS — E782 Mixed hyperlipidemia: Secondary | ICD-10-CM

## 2024-09-12 DIAGNOSIS — M6281 Muscle weakness (generalized): Secondary | ICD-10-CM

## 2024-09-12 DIAGNOSIS — I2510 Atherosclerotic heart disease of native coronary artery without angina pectoris: Secondary | ICD-10-CM | POA: Diagnosis not present

## 2024-09-12 NOTE — Progress Notes (Signed)
 "     Clinical Summary [name] is a 59 y.o.male seen today for follow up of the following medical problems.      1. CAD - admit 01/2015 with lateral STEMI, received DES to D1. There was a 70% ramus and 40% mid RCA that was medically managed - 01/2015 echo LVEF 40%, lateral wall hypokinesis - no beta blocker due to history of sinus pause and bradycardia. No ACE-I given history of severe angioedema requiring intubation, no ARB due to risk of cross reactivity    04/2017 DES to ramus -07/2018 nuclear stress prior infarct, no current ischemia -07/2022 nuclear stress:  Findings are consistent with prior anterolatera/lateral/inferolateral myocardial infarction. There is no current ischemia.  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 This was during admission with CVA.   - no recent edema, no SOB/DOE - compliant with meds     3. SOB/COPD - 07/2015 PFTs: mild to mod ventilatory defect with small airway obstruction.   - 07/2015 CT PE no PE, though suboptimal study - 07/2015 CXR no acute process.   PFTs with mild to mod vent defect, ABGs showed borderline resting hypoxemia.     - followed by Dr Darlean     3. Hyperlipidemia - atorva increased to 80mg  due to recent admission 05/2024 TC 110 TG 105 HDL 29 LDL 61   4. Syncope - nocturnal bradycardia noted on previous monitors, no significant daytime episodes - has loop recorder, followed by EP - no beta blockers due to sinus pauses.         5. HTN -he is compliant with meds       6. OSA  -using cpap machine.      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    8. CVA - admit 03/2024 - on ASA and plavix  per neuro - 06/2024 30 day monitor without afib     SH: within last few years his son overdosed, passed away Girlfriend passed on Thanksgiving. THey lived together, he is working on moving.  Currently in low income apartment Past Medical History:  Diagnosis Date   ACE inhibitor-aggravated angioedema    Allergy     Anemia    Angio-edema    Anxiety    Arthritis    Asthma    hip replacement   Back pain    Bradycardia 04/28/2017   Bulging of cervical intervertebral disc    CAD (coronary artery disease)    lateral STEMI 02/06/2015 00% D1 occlusion treated with Promus Premier 2.5 mm x 16 mm DES, 70% ramus stenosis, 40% mid RCA stenosis, 45% distal RCA stenosis, EF 45-50%   CHF (congestive heart failure) (HCC)    COPD (chronic obstructive pulmonary disease) (HCC)    Depression    Diabetes mellitus without complication (HCC)    Difficult intubation    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   Dry eye    Dyspnea    Early satiety 09/23/2016   Fatty liver    GERD (gastroesophageal reflux disease)    HCAP (healthcare-associated pneumonia) 05/15/2017   Headache  Heart murmur    Hip pain    Hyperlipidemia    Hypertension    Lobar pneumonia 05/15/2017   Melena 08/04/2018   MI (myocardial infarction) [facility])    Myocardial infarction [facility])    Neck pain    Non-ST elevation (NSTEMI) myocardial infarction (HCC) 04/27/2017   NSTEMI (non-ST elevated myocardial infarction) (HCC) 04/26/2017   Otitis media    Pleurisy    Pneumonia due to COVID-19 virus 10/07/2019   Rectal bleeding 11/08/2018   Right shoulder pain 03/20/2020   Sinus pause    9 sec sinus pause on telemetry after started on coreg  after MI, avoid AV nodal blocking agent   Sleep apnea    uses a cpap   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     amLODipine  (NORVASC ) 10 MG tablet TAKE ONE TABLET BY MOUTH DAILY 90 tablet 1   Ascorbic Acid  (VITAMIN C  PO) Take 1 tablet by mouth daily.     aspirin  EC 81 MG tablet Take 1 tablet (81 mg total) by mouth daily. Swallow whole. 30 tablet 12   atorvastatin  (LIPITOR ) 80 MG tablet Take 1 tablet (80 mg total) by mouth daily. 90 tablet 3   Cholecalciferol  (VITAMIN D -3 PO) Take 1 capsule by mouth daily.     citalopram  (CELEXA ) 20 MG tablet Take 1 tablet (20 mg total) by mouth daily. 90 tablet 1   clopidogrel  (PLAVIX ) 75 MG tablet Take 1 tablet (75 mg total) by mouth daily. 30 tablet 6   dapagliflozin  propanediol (FARXIGA ) 10 MG TABS tablet Take 1 tablet (10 mg total) by mouth daily. 90 tablet 0   Dulaglutide  (TRULICITY ) 1.5 MG/0.5ML SOAJ Inject 1.5 mg into the skin once a week. 6 mL 0   ezetimibe  (ZETIA ) 10 MG tablet TAKE ONE TABLET BY MOUTH DAILY 90 tablet 1   Ferrous Sulfate  (IRON PO) Take 1 tablet by mouth daily.     furosemide  (LASIX ) 40 MG tablet Take 1 tablet (40 mg total) by mouth daily as needed (excessive swelling). 30 tablet 6   MAGNESIUM  PO Take 1 tablet by mouth 2 (two) times daily.     nitroGLYCERIN  (NITROSTAT ) 0.4 MG SL tablet Place 1 tablet (0.4 mg total) under the tongue every 5 (five) minutes x 3 doses as needed (if no relief after 3rd dose, proceed to ED or call 911). 25 tablet 3   pantoprazole  (PROTONIX ) 40 MG tablet TAKE (1) TABLET BY MOUTH TWICE DAILY BEFORE A MEAL. 180 tablet 1   pregabalin (LYRICA) 75 MG capsule Take 75 mg by mouth daily.     RESTASIS  0.05 %  ophthalmic emulsion Place 1 drop into both eyes 2 (two) times daily.     SYMBICORT  160-4.5 MCG/ACT inhaler INHALE 2 PUFFS INTO THE LUNGS FIRST THING IN THE MORNING AND 2 PUFFS ABOUT 12 HOURS LATER. 10.2 g 12   No current facility-administered medications for this visit.     Past Surgical History:  Procedure Laterality Date   ARTERY EXPLORATION Left 03/24/2024   Procedure:  LEFT ARCH AORTOGRAM, LEFT UPPER EXTREMITY ANGIOGRAM WITH FIRST ORDER CANNULATION, LEFT AXILLARY ANGIOPLASTY AND STENTING, EVACUATION OF LEFT AXILLARY HEMATOMA;  Surgeon: Magda Debby SAILOR, MD;  Location: [facility] OR;  Service: Vascular;  Laterality: Left;   ARTERY REPAIR Left 03/11/2024   Procedure: REPAIR LEFT AXILLARY ARTERY;  Surgeon: Serene Gaile ORN, MD;  Location: [facility] OR;  Service: Vascular;  Laterality: Left;  ARTERIAL REPAIR, LEFT AXILLARY   BIOPSY  10/09/2016   Procedure: BIOPSY;  Surgeon: Lamar CHRISTELLA Hollingshead, MD;  Location: [facility];  Service: Endoscopy;;   BIOPSY  11/28/2019   Procedure: BIOPSY;  Surgeon: Hollingshead Lamar CHRISTELLA, MD;  [facility]: [facility];  Service: Endoscopy;;   BIOPSY  06/15/2023   Procedure: BIOPSY;  Surgeon: Hollingshead Lamar CHRISTELLA, MD;  [facility]: [facility];  Service: Endoscopy;;   BRONCHIAL BIOPSY  10/16/2022   Procedure: BRONCHIAL BIOPSIES;  Surgeon: Gladis Leonor CHRISTELLA, MD;  Location: [facility] ENDOSCOPY;  Service: Pulmonary;;   BRONCHIAL NEEDLE ASPIRATION BIOPSY  10/16/2022   Procedure: BRONCHIAL NEEDLE ASPIRATION BIOPSIES;  Surgeon: Gladis Leonor CHRISTELLA, MD;  Location: [facility] ENDOSCOPY;  Service: Pulmonary;;   BRONCHIAL WASHINGS  10/16/2022   Procedure: BRONCHIAL WASHINGS;  Surgeon: Gladis Leonor CHRISTELLA, MD;  Location: [facility] ENDOSCOPY;  Service: Pulmonary;;   CARDIAC CATHETERIZATION N/A 02/06/2015   Procedure: Left Heart Cath and Coronary Angiography;  Surgeon: Alm ORN Clay, MD;  Location: [facility] INVASIVE CV LAB;  Service: Cardiovascular;  Laterality: N/A;   CARDIAC CATHETERIZATION N/A 02/06/2015   Procedure: Coronary Stent  Intervention;  Surgeon: Alm ORN Clay, MD;  Location: [facility] INVASIVE CV LAB;  Service: Cardiovascular;  Laterality: N/A;   COLONOSCOPY N/A 09/07/2024   Procedure: COLONOSCOPY;  Surgeon: Hollingshead Lamar CHRISTELLA, MD;  [facility];  Service: Endoscopy;  Laterality: N/A;  11:30am, asa 3   COLONOSCOPY WITH PROPOFOL  N/A 10/09/2016   Sigmoid and descending colon diverticulosis, four 4-6 mm polyps in sigmoid, one 4 mm polyp in descending. Tubular adenomas and hyperplastic. 5 year surveillance.    COLONOSCOPY WITH PROPOFOL  N/A 11/24/2016   Sigmoid and descending colon diverticulosis, four 4-6 mm polyps in sigmoid, one 4 mm polyp in descending. Tubular adenomas and hyperplastic. 5 year surveillance.    COLONOSCOPY WITH PROPOFOL  N/A 10/18/2021   Carver: nonbleeding internal hemorrhoids, small and large mouth diverticula found in the sigmoid, descending, transverse colon.  A 9 mm sessile polyp was found in the ascending colon that was removed.  4 sessile polyps found in the sigmoid, descending, transverse colon 3 to 5 mm in size, examining otherwise.  Path revealed tubular adenomas.  Repeat due in 5 years for surveillance.   CORONARY ANGIOPLASTY WITH STENT PLACEMENT  01/2015   CORONARY STENT INTERVENTION N/A 04/27/2017   Procedure: CORONARY STENT INTERVENTION;  Surgeon: Mady Bruckner, MD;  Location: [facility] INVASIVE CV LAB;  Service: Cardiovascular;  Laterality: N/A;   ELECTROPHYSIOLOGY STUDY N/A 06/29/2017   Procedure: ELECTROPHYSIOLOGY STUDY;  Surgeon: Waddell Danelle ORN, MD;  Location: [facility] INVASIVE CV LAB;  Service: Cardiovascular;  Laterality: N/A;   ESOPHAGOGASTRODUODENOSCOPY (EGD) WITH PROPOFOL  N/A 10/09/2016   Dr. Hollingshead: LA grade a esophagitis.  Barrett's esophagus, biopsy-proven.  Small hiatal hernia.  EGD February 2019   ESOPHAGOGASTRODUODENOSCOPY (EGD) WITH PROPOFOL  N/A 11/28/2019    salmon-colored esophageal mucosa (Barrett's) small hiatal hernia, portal hypertensive gastropathy, normal duodenum, 3 year  surveillance   ESOPHAGOGASTRODUODENOSCOPY (EGD) WITH PROPOFOL  N/A 09/06/2021   three columns of grade 1 varices in distal esophagus, no stigmata of bleeding or red wale signs.  Small hiatal hernia. Mild portal gastropathy. Normal duodenum. Repeat upper endoscopy in 1 year for surveillance.   ESOPHAGOGASTRODUODENOSCOPY (EGD) WITH PROPOFOL  N/A 06/15/2023   Procedure: ESOPHAGOGASTRODUODENOSCOPY (EGD) WITH PROPOFOL ;  Surgeon: Shaaron Lamar HERO, MD;  [facility];  Service: Endoscopy;  Laterality: N/A;  830am, asa 3   FIDUCIAL MARKER PLACEMENT  10/16/2022   Procedure: FIDUCIAL MARKER PLACEMENT;  Surgeon: Gladis Leonor HERO, MD;  Location: [facility] ENDOSCOPY;  Service: Pulmonary;;   INCISION / DRAINAGE HAND / FINGER     LEFT HEART CATH AND CORONARY ANGIOGRAPHY N/A 04/27/2017   Procedure: LEFT HEART CATH AND CORONARY ANGIOGRAPHY;  Surgeon: Mady Bruckner, MD;  Location: [facility] INVASIVE CV LAB;  Service: Cardiovascular;  Laterality: N/A;   LOOP RECORDER INSERTION  06/29/2017   Procedure: Loop Recorder Insertion;  Surgeon: Waddell Danelle ORN, MD;  Location: [facility] INVASIVE CV LAB;  Service: Cardiovascular;;   POLYPECTOMY  11/24/2016   Procedure: POLYPECTOMY;  Surgeon: Lamar HERO Shaaron, MD;  [facility];  Service: Endoscopy;;  descending and sigmoid   POLYPECTOMY  10/18/2021   Procedure: POLYPECTOMY;  Surgeon: Cindie Carlin POUR, DO;  [facility];  Service: Endoscopy;;   TOTAL HIP ARTHROPLASTY Right 07/01/2016   TOTAL HIP ARTHROPLASTY Right 07/01/2016   Procedure: RIGHT TOTAL HIP ARTHROPLASTY ANTERIOR APPROACH;  Surgeon: Bruckner CINDERELLA Poli, MD;  Location[facility] OR;  Service: Orthopedics;  Laterality: Right;   ULTRASOUND GUIDANCE FOR VASCULAR ACCESS Left 03/24/2024   Procedure: ULTRASOUND GUIDANCE, FOR VASCULAR ACCESS;  Surgeon: Magda Debby SAILOR, MD;  Location: [facility] OR;  Service: Vascular;  Laterality: Left;     Allergies[2]    Family History  Problem Relation Age of Onset   Heart  attack Father    Stroke Father    Arthritis Father    Heart disease Father    Cancer Mother        ???   Arthritis Mother    Heart disease Brother 48       died in sleep   Early death Brother    Diabetes Maternal Uncle    Alzheimer's disease Maternal Grandmother      Social History Mr. Grime reports that he has been smoking cigarettes. He has a 69 pack-year smoking history. He has never been exposed to tobacco smoke. He has quit using smokeless tobacco.  His smokeless tobacco use included chew. Mr. Bargar reports current alcohol  use.    Physical Examination Today's Vitals   09/12/24 1142 09/12/24 1203  BP: (!) 142/76 124/68  Pulse: 79   SpO2: 94%   Weight: 186 lb 3.2 oz (84.5 kg)   Height: 5' 5 (1.651 m)    Body mass index is 30.99 kg/m.  Gen: resting comfortably, no acute distress HEENT: no scleral icterus, pupils equal round and reactive, no palptable cervical adenopathy,  CV: RRR, no mrg, no jvd Resp: Clear to auscultation bilaterally GI: abdomen is soft, non-tender, non-distended, normal bowel sounds, no hepatosplenomegaly MSK: extremities are warm, no edema.  Skin: warm, no rash Neuro:  no focal deficits Psych: appropriate affect   Diagnostic Studies  01/2015 cath 1st Diag lesion, 100% stenosed. A Promus Premier 2.5 mm x 16 mm drug-eluting stent was placed. There is a 0% residual stenosis post intervention. Ramus lesion, 70% stenosed. Mid RCA lesion, 40% stenosed. Dist RCA lesion, 45% stenosed. Mild to moderately reduced LVEF with anterolateral and apical hypokinesis and elevated LVEDP   Abnormal anatomy with equal sized Diagonal and LAD Chara Marquard but extensive RCA. Successful PCI  of the [employer]   Recommendations: Standard post radial cath/PCI TR band removal Dual independent therapy for minimum one year. Check 2-D echocardiogram to better assess EF Add statin and beta blocker. Smoking cessation counseling. Cardiac Rehabilitation And Case  Management Consultation If hemodynamic stable, would consider fast-track discharge Would consider noninvasive evaluation of the Ramus Intermedius lesion in the absence of any recurrent anginal pain.   01/2015 echo Study Conclusions  - Left ventricle: Moderately severe hypokinesis of mid   anterolateral segment, apical lateral segment, mid/apical   anterior segments. EF is 40%. The cavity size was normal. Wall   thickness was increased in a pattern of mild LVH. - Aortic valve: Sclerosis without stenosis. There was no   significant regurgitation. - Right ventricle: The cavity size was normal. Systolic function   was normal.   07/2015 CT PE IMPRESSION: 1. Suboptimal contrast bolus timing in the pulmonary arterial tree, poor in the lobar vessels other than the right lower lobe. No central pulmonary embolus. If there is continued suspicion of acute PE nuclear medicine V/Q scan may be most helpful. 2. Situs abnormality in the chest with right side aortic arch and descending aorta. Mirror image branching. Other visible situs including cardiac and upper abdominal situs appear normal. 3. Calcified Coronary artery atherosclerosis. Hepatomegaly with fatty liver disease.   07/2015 PFTs: mild to mod ventilatory defect with small airway obstruction, moderately reduced DLCO.    07/2018 nuclear stress There was no ST segment deviation noted during stress. Defect 1: There is a medium defect of moderate severity present in the mid anterior, mid anterolateral and apical anterior location. Findings consistent with prior myocardial infarction. This is an intermediate risk study. Nuclear stress EF: 53%.     Assessment and Plan  1. CAD/ICM -denies any symptoms, continue current meds   2. HFimpEF - mild variation in LVEF over the years, most recent echo during admission with CVA LVEF 45-50%. Perhaps stress induced decline - continue current therapy     3. Hyperlipideima - followe LDL, very  near goal of <55. If persistently above would change atorvaststin to crestor  40mg  daily, he remains on zetia  10mg  daily.    4. HTN - at goal, continue current meds         Dorn PHEBE Ross, M.D.     [1]  Allergies Allergen Reactions   Coreg  [Carvedilol ] Other (See Comments)    Sinus pause on telemetry >3 seconds. Longest one 9 sec. No AV nodal agent   Zestril  [Lisinopril ] Anaphylaxis, Shortness Of Breath and Swelling    Angioedema, required intubation and mechanical ventilation   Chantix  [Varenicline ] Other (See Comments)    Nightmares Insomnia    Amoxil  [Amoxicillin ] Nausea Only  [2]  Allergies Allergen Reactions   Coreg  [Carvedilol ] Other (See Comments)    Sinus pause on telemetry >3 seconds. Longest one 9 sec. No AV nodal agent   Zestril  [Lisinopril ] Anaphylaxis, Shortness Of Breath and Swelling    Angioedema, required intubation and mechanical ventilation   Chantix  [Varenicline ] Other (See Comments)    Nightmares Insomnia    Amoxil  [Amoxicillin ] Nausea Only   "

## 2024-09-12 NOTE — Patient Instructions (Signed)
Medication Instructions:  Your physician recommends that you continue on your current medications as directed. Please refer to the Current Medication list given to you today.   Labwork: None today  Testing/Procedures: None today  Follow-Up: 6 months Dr.Branch  Any Other Special Instructions Will Be Listed Below (If Applicable).  If you need a refill on your cardiac medications before your next appointment, please call your pharmacy.

## 2024-09-12 NOTE — Therapy (Signed)
 " OUTPATIENT PHYSICAL THERAPY TREATMENT   Patient Name: Randy Hebert MRN: [id number] DOB:07-13-1966, 59 y.o., male Today's Date: 09/12/2024  END OF SESSION:  PT End of Session - 09/12/24 1502     Visit Number 19    Number of Visits 24    Date for Recertification  10/11/24    Progress Note Due on Visit 20    PT Start Time 1426    PT Stop Time 1515    PT Time Calculation (min) 49 min    Activity Tolerance Patient tolerated treatment well    Behavior During Therapy [facility] for tasks assessed/performed              Past Medical History:  Diagnosis Date   ACE inhibitor-aggravated angioedema    Allergy     Anemia    Angio-edema    Anxiety    Arthritis    Asthma    hip replacement   Back pain    Bradycardia 04/28/2017   Bulging of cervical intervertebral disc    CAD (coronary artery disease)    lateral STEMI 02/06/2015 00% D1 occlusion treated with Promus Premier 2.5 mm x 16 mm DES, 70% ramus stenosis, 40% mid RCA stenosis, 45% distal RCA stenosis, EF 45-50%   CHF (congestive heart failure) (HCC)    COPD (chronic obstructive pulmonary disease) (HCC)    Depression    Diabetes mellitus without complication (HCC)    Difficult intubation    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   Dry eye    Dyspnea    Early satiety 09/23/2016   Fatty liver    GERD (gastroesophageal reflux disease)    HCAP (healthcare-associated pneumonia) 05/15/2017   Headache    Heart murmur    Hip pain    Hyperlipidemia    Hypertension    Lobar pneumonia 05/15/2017   Melena 08/04/2018   MI (myocardial infarction) [facility])    Myocardial infarction (HCC)    Neck pain    Non-ST elevation (NSTEMI) myocardial infarction (HCC) 04/27/2017   NSTEMI (non-ST elevated myocardial infarction) (HCC) 04/26/2017   Otitis media    Pleurisy    Pneumonia due to COVID-19 virus 10/07/2019   Rectal bleeding 11/08/2018   Right shoulder pain 03/20/2020   Sinus pause    9 sec sinus pause on telemetry  after started on coreg  after MI, avoid AV nodal blocking agent   Sleep apnea    uses a cpap   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   STEMI (ST elevation myocardial infarction) (HCC) 05/31/2015   Substance abuse (HCC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   Syncope 06/29/2017   Syncope and collapse 05/15/2017   Transaminitis 08/04/2018   Unilateral primary osteoarthritis, right hip 07/01/2016   Past Surgical History:  Procedure Laterality Date   ARTERY EXPLORATION Left 03/24/2024   Procedure: LEFT ARCH AORTOGRAM, LEFT UPPER EXTREMITY ANGIOGRAM WITH FIRST ORDER CANNULATION, LEFT AXILLARY ANGIOPLASTY AND STENTING, EVACUATION OF LEFT AXILLARY HEMATOMA;  Surgeon: Magda Debby SAILOR, MD;  Location: [facility] OR;  Service: Vascular;  Laterality: Left;   ARTERY REPAIR Left 03/11/2024   Procedure: REPAIR LEFT AXILLARY ARTERY;  Surgeon: Serene Gaile ORN, MD;  Location: [facility] OR;  Service: Vascular;  Laterality: Left;  ARTERIAL REPAIR, LEFT AXILLARY   BIOPSY  10/09/2016   Procedure: BIOPSY;  Surgeon: Lamar CHRISTELLA Hollingshead, MD;  Location: [facility] ENDO SUITE;  Service: Endoscopy;;   BIOPSY  11/28/2019   Procedure: BIOPSY;  Surgeon: Hollingshead Lamar CHRISTELLA, MD;  Location: [facility]  ENDO SUITE;  Service: Endoscopy;;   BIOPSY  06/15/2023   Procedure: BIOPSY;  Surgeon: Shaaron Lamar HERO, MD;  [facility]: [facility] END[facility];  Service: Endoscopy;;   BRONCHIAL BIOPSY  10/16/2022   Procedure: BRONCHIAL BIOPSIES;  Surgeon: Gladis Leonor HERO, MD;  Location: [facility] ENDOSCOPY;  Service: Pulmonary;;   BRONCHIAL NEEDLE ASPIRATION BIOPSY  10/16/2022   Procedure: BRONCHIAL NEEDLE ASPIRATION BIOPSIES;  Surgeon: Gladis Leonor HERO, MD;  Location: [facility] ENDOSCOPY;  Service: Pulmonary;;   BRONCHIAL WASHINGS  10/16/2022   Procedure: BRONCHIAL WASHINGS;  Surgeon: Gladis Leonor HERO, MD;  Location: [facility] ENDOSCOPY;  Service: Pulmonary;;   CARDIAC CATHETERIZATION N/A 02/06/2015   Procedure: Left Heart Cath and Coronary Angiography;  Surgeon: Alm LELON Clay, MD;  Location: [facility] INVASIVE CV  LAB;  Service: Cardiovascular;  Laterality: N/A;   CARDIAC CATHETERIZATION N/A 02/06/2015   Procedure: Coronary Stent Intervention;  Surgeon: Alm LELON Clay, MD;  Location: [facility] INVASIVE CV LAB;  Service: Cardiovascular;  Laterality: N/A;   COLONOSCOPY N/A 09/07/2024   Procedure: COLONOSCOPY;  Surgeon: Shaaron Lamar HERO, MD;  [facility];  Service: Endoscopy;  Laterality: N/A;  11:30am, asa 3   COLONOSCOPY WITH PROPOFOL  N/A 10/09/2016   Sigmoid and descending colon diverticulosis, four 4-6 mm polyps in sigmoid, one 4 mm polyp in descending. Tubular adenomas and hyperplastic. 5 year surveillance.    COLONOSCOPY WITH PROPOFOL  N/A 11/24/2016   Sigmoid and descending colon diverticulosis, four 4-6 mm polyps in sigmoid, one 4 mm polyp in descending. Tubular adenomas and hyperplastic. 5 year surveillance.    COLONOSCOPY WITH PROPOFOL  N/A 10/18/2021   Carver: nonbleeding internal hemorrhoids, small and large mouth diverticula found in the sigmoid, descending, transverse colon.  A 9 mm sessile polyp was found in the ascending colon that was removed.  4 sessile polyps found in the sigmoid, descending, transverse colon 3 to 5 mm in size, examining otherwise.  Path revealed tubular adenomas.  Repeat due in 5 years for surveillance.   CORONARY ANGIOPLASTY WITH STENT PLACEMENT  01/2015   CORONARY STENT INTERVENTION N/A 04/27/2017   Procedure: CORONARY STENT INTERVENTION;  Surgeon: Mady Bruckner, MD;  Location: [facility] INVASIVE CV LAB;  Service: Cardiovascular;  Laterality: N/A;   ELECTROPHYSIOLOGY STUDY N/A 06/29/2017   Procedure: ELECTROPHYSIOLOGY STUDY;  Surgeon: Waddell Danelle LELON, MD;  Location: [facility] INVASIVE CV LAB;  Service: Cardiovascular;  Laterality: N/A;   ESOPHAGOGASTRODUODENOSCOPY (EGD) WITH PROPOFOL  N/A 10/09/2016   Dr. Shaaron: LA grade a esophagitis.  Barrett's esophagus, biopsy-proven.  Small hiatal hernia.  EGD February 2019   ESOPHAGOGASTRODUODENOSCOPY (EGD) WITH PROPOFOL  N/A 11/28/2019     salmon-colored esophageal mucosa (Barrett's) small hiatal hernia, portal hypertensive gastropathy, normal duodenum, 3 year surveillance   ESOPHAGOGASTRODUODENOSCOPY (EGD) WITH PROPOFOL  N/A 09/06/2021   three columns of grade 1 varices in distal esophagus, no stigmata of bleeding or red wale signs. Small hiatal hernia. Mild portal gastropathy. Normal duodenum. Repeat upper endoscopy in 1 year for surveillance.   ESOPHAGOGASTRODUODENOSCOPY (EGD) WITH PROPOFOL  N/A 06/15/2023   Procedure: ESOPHAGOGASTRODUODENOSCOPY (EGD) WITH PROPOFOL ;  Surgeon: Shaaron Lamar HERO, MD;  [facility];  Service: Endoscopy;  Laterality: N/A;  830am, asa 3   FIDUCIAL MARKER PLACEMENT  10/16/2022   Procedure: FIDUCIAL MARKER PLACEMENT;  Surgeon: Gladis Leonor HERO, MD;  Location: [facility] ENDOSCOPY;  Service: Pulmonary;;   INCISION / DRAINAGE HAND / FINGER     LEFT HEART CATH AND CORONARY ANGIOGRAPHY N/A 04/27/2017   Procedure: LEFT HEART CATH AND CORONARY ANGIOGRAPHY;  Surgeon: Mady Bruckner, MD;  Location: [facility] INVASIVE CV LAB;  Service: Cardiovascular;  Laterality: N/A;   LOOP RECORDER INSERTION  06/29/2017   Procedure: Loop Recorder Insertion;  Surgeon: Waddell Danelle ORN, MD;  Location: [facility] INVASIVE CV LAB;  Service: Cardiovascular;;   POLYPECTOMY  11/24/2016   Procedure: POLYPECTOMY;  Surgeon: Lamar CHRISTELLA Hollingshead, MD;  [facility];  Service: Endoscopy;;  descending and sigmoid   POLYPECTOMY  10/18/2021   Procedure: POLYPECTOMY;  Surgeon: Cindie Carlin POUR, DO;  [facility];  Service: Endoscopy;;   TOTAL HIP ARTHROPLASTY Right 07/01/2016   TOTAL HIP ARTHROPLASTY Right 07/01/2016   Procedure: RIGHT TOTAL HIP ARTHROPLASTY ANTERIOR APPROACH;  Surgeon: Lonni CINDERELLA Poli, MD;  Location: [facility] OR;  Service: Orthopedics;  Laterality: Right;   ULTRASOUND GUIDANCE FOR VASCULAR ACCESS Left 03/24/2024   Procedure: ULTRASOUND GUIDANCE, FOR VASCULAR ACCESS;  Surgeon: Magda Debby SAILOR, MD;  Location: [facility] OR;   Service: Vascular;  Laterality: Left;   Patient Active Problem List   Diagnosis Date Noted   Pain of left upper extremity 04/24/2024   Acute postoperative anemia due to greater than expected blood loss 03/26/2024   Obesity, class 1 03/26/2024   Aneurysm of axillary artery 03/24/2024   Axillary artery injury 03/12/2024   Acute CVA (cerebrovascular accident) (HCC) 03/02/2024   Essential hypertension 03/02/2024   Dyslipidemia 03/02/2024   Type 2 diabetes mellitus with hyperlipidemia (HCC) 03/02/2024   Erectile dysfunction 02/24/2023   Chronic eczematous otitis externa of both ears 02/05/2023   Tobacco abuse 12/25/2022   Malignant neoplasm of upper lobe of right lung (HCC) 09/25/2022   Cervical spondylosis with myelopathy 08/01/2022   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   DDD (degenerative disc disease), cervical 05/21/2022   Osteoarthritis of both hands 05/19/2022   History of GI bleed 11/13/2021   Insomnia 09/13/2021   Cirrhosis of liver (HCC) 11/08/2018   Barrett's esophagus 08/04/2018   Vitamin D  deficiency 06/30/2018   Alcohol  abuse 06/30/2018   DM type 2 causing vascular disease (HCC) 08/17/2017   Chronic combined systolic and diastolic CHF (congestive heart failure) (HCC) 05/15/2017   OSA (obstructive sleep apnea) 04/28/2017   Depression with anxiety 04/15/2017   GERD (gastroesophageal reflux disease) 09/23/2016   Asthmatic bronchitis , chronic (HCC) 08/27/2015   Mixed hyperlipidemia 08/27/2015   Class 1 obesity due to excess calories with serious comorbidity and body mass index (BMI) of 32.0 to 32.9 in adult 08/27/2015   CAD (coronary artery disease) 05/31/2015   Essential hypertension, benign 02/09/2015    PCP: Cook, Jayce G, DO   REFERRING PROVIDER: Noralee Elidia Sieving*   REFERRING DIAG: left axillary pseudoaneurysm   Rationale for Evaluation and Treatment:  Rehabiliation  THERAPY DIAG:  Muscle weakness (generalized)  Acute pain of left shoulder  ONSET DATE:  03/11/24   SUBJECTIVE:  SUBJECTIVE STATEMENT: He says pain in his right arm today 6/10. His left arm is not having pain. Feels overall he is naturally able to use his left arm more now for daily tasks such as folding laundry, washing dishes and now cooking.     PERTINENT HISTORY:  past medical history of diabetes mellitus type 2, hypertension, CAD, liver cirrhosis, anxiety, depression, OSA, CVA w/ left-sided weakness who had a recent hospitalization 7/11 to 03/19/24 post traumatic left shoulder dislocation, complicated with left axillary hematoma with left axillary pseudoaneurysm. On 07/12 he underwent open exposure and repair of the left axillary pseudoaneurysm and evacuation of left axillary hematoma.   PAIN:  NPRS scale: 1/10 Pain location:left shoulder/axillary, left forearm and wrist Pain description: constant, achy, sharp Aggravating factors: any use of this left arm. Relieving factors: rest, meds   PRECAUTIONS: ,  Shoulder  RED FLAGS: None   WEIGHT BEARING RESTRICTIONS:  Not anymore  FALLS:  Has patient fallen in last 6 months? Yes. Number of falls 1   OCCUPATION:  disabled  PLOF:  Independent with basic ADLs  PATIENT GOALS:  Reduce pain and get his arm back to normal  OBJECTIVE:  Note: Objective measures were completed at Evaluation unless otherwise noted.  DIAGNOSTIC FINDINGS:  Chest CT IMPRESSION: 1. Enlarging left axillary hematoma with arterial contrast extravasation from the posterior wall of the left axillary artery into a 1.5 x 1.8 cm pseudoaneurysm, with additional 1.8 x 1.1 cm pocket of contrast more posteriorly within the collection. 2. Increased heterogeneous hematoma in the left subscapularis region measuring 7.3 x 4.8 cm, but no focal contrast is seen within  this. 3. Emphysema. 4. Right apical fiducial markers and post XRT changes, with treated nodule just above the fiducial markers today measuring 8 mm, previously 9 mm. 5. Aortic and coronary artery atherosclerosis.  Right aortic arch. 6. Enlarged pulmonary trunk 3.3 cm indicating arterial hypertension. 7. Cirrhosis and splenomegaly.  Prominent patent portal vein 1.7 cm. 8. Critical Value/emergent results were called by telephone at the time of interpretation on 03/24/2024 at 5:18 am to provider [facility] , who verbally acknowledged these results.  Shoulder XR IMPRESSION: No acute bony abnormality.   Postoperative changes in the left axilla.  No imaging for left elbow/wrist  06/03/24 EMG & NCS Examination  Impression: 1. This is an abnormal electrodiagnostic study of the left upper extremity 2. There is electrodiagnostic evidence of a diffuse, subacute brachial plexopathy affecting the upper, middle, and lower trunks of the left upper extremity with widespread denervation changes indicating ongoing axonal injury   PATIENT SURVEYS:  Patient-Specific Activity Scoring Scheme  0 represents unable to perform. 10 represents able to perform at prior level. 0 1 2 3 4 5 6 7 8 9  10 (Date and Score)   Activity Eval  06/21/24  1. Raising left arm  0 0   2. Putting on and off shirt 1  8  3.     4.    5.    Score 0.5/10 4/10   Total score = sum of the activity scores/number of activities Minimum detectable change (90%CI) for average score = 2 points Minimum detectable change (90%CI) for single activity score = 3 points     EDEMA:  Yes: moderate edema noted in left upper extremity from shoulder to hand  Observation:  2 incisions noted, one at proximal chest, one at axilla, both incisions closed with stitches and appear closed without any drainage or signs of infection at the moment. Dressing  was removed and changed for him at eval.     UPPER EXTREMITY  ROM:  AROM/Passive ROM Left eval Left 04/13/24 Left 05/24/24 Left 06/21/24 Left 08/16/24  Shoulder flexion /50 /120 30/150 40/ 40/WFL  Shoulder extension       Shoulder abduction /50 /100 20/140 22/ 22/WFL  Shoulder adduction       Shoulder extension       Shoulder internal rotation /50    L2 behind back in standing /  Shoulder external rotation /40 /70   To ear in standing/  Elbow flexion       Elbow extension       Wrist flexion       Wrist extension       Wrist ulnar deviation       Wrist radial deviation       Wrist pronation       Wrist supination        (Blank rows = not tested)   UPPER EXTREMITY MMT:  MMT Left eval Left 05/24/24 Left 06/21/24 Left 07/19/24 Left 08/16/24  Shoulder flexion 1 2 2 2 2   Shoulder extension       Shoulder abduction 1 2 2 2 2   Shoulder adduction       Shoulder extension       Shoulder internal rotation 1 2 4 4 4   Shoulder external rotation 1 2 3  3+ 3+  Middle trapezius       Lower trapezius       Elbow flexion 1 3 4 4 4   Elbow extension 2 3 4 4 4   Wrist flexion       Wrist extension       Wrist ulnar deviation       Wrist radial deviation       Wrist pronation       Wrist supination       Grip strength     3   (Blank rows = not tested)                                                                                                                              TREATMENT DATE:   09/13/23 Theractivity (ROM and strength for reaching, push/pull, gripping) UBE L2 X 10 min , push/pull Wall ladder X 10 reps flexion, X 5 reps scaption Pulleys X 15 reps flexion with slow eccentrics Standing deltoid flexion isometric pushing into green ball 5 sec X 10, and abduction isometric 5 sec X 10 Seated bicep curls 5# X 5 reps, then 3# X 10 reps Standing chest press green X 15 Standing IR green X 15 Standing shoulder adduction Green X 15 Standing shoulder row Green X 15 Standing shoulder extension Green X 15 Standing ER Green X 15 Supine  shoulder flexion AAROM with cane X 10 with slow eccentrics Supine shoulder abduction AAROM with cane X 10 with slow eccentrics Supine shoulder ER AAROM X 10 reps  09/09/23  Theractivity (ROM and strength for reaching, push/pull, gripping) UBE L2 X 10 min , push/pull Wall ladder X 10 reps flexion, X 5 reps scaption Pulleys X 15 reps flexion with slow eccentrics Standing deltoid flexion isometric pushing into green ball 5 sec X 10, and abduction isometric 5 sec X 10 Seated bicep curls 3# X 10 reps Standing chest press green X 15 Standing IR green X 15 Standing shoulder adduction Green X 15 Standing shoulder row Green X 15 Standing shoulder extension Green X 15 Standing ER Green X 15 Supine shoulder flexion AAROM with cane X 10 with slow eccentrics Supine shoulder abduction AAROM with cane X 10 with slow eccentrics Supine shoulder ER AAROM X 10 reps        PATIENT EDUCATION: Education details: HEP, PT plan of care, selfcare Person educated: Patient Education method: Explanation, Demonstration, Verbal cues, and Handouts Education comprehension: verbalized understanding, further education recommended   HOME EXERCISE PROGRAM: Access Code: [email or web address] URL: [email or web address] Date: 03/31/2024 Prepared by: Redell Moose  Exercises - Circular Shoulder Pendulum with Table Support  - 2-3 x daily - 7 x weekly - 1 sets - 15 reps - Seated Shoulder Abduction Towel Slide at Table Top  - 2-3 x daily - 6 x weekly - 1 sets - 10 reps - 5 sec hold - Seated Shoulder Flexion Towel Slide at Table Top  - 2-3 x daily - 6 x weekly - 1 sets - 10 reps - 5 sec hold - Seated Shoulder External Rotation PROM on Table  - 2-3 x daily - 6 x weekly - 1 sets - 10 reps - 5 sec hold - Seated Gripping Towel  - 2-3 x daily - 6 x weekly - 1 sets - 20 reps - Standing Elbow Flexion Extension AROM  - 2-3 x daily - 6 x weekly - 1 sets - 10 reps  ASSESSMENT: Today's session was a continuation of functional  UE strengthening. Pt tolerated session well noting improvement in exercise tolerance and strength.  OBJECTIVE IMPAIRMENTS: decreased activity tolerance for ADL's, difficulty walking, decreased balance, decreased endurance, decreased mobility, decreased ROM, decreased strength, impaired flexibility, impaired UE use, and pain.  ACTIVITY LIMITATIONS: bending, lifting, carry, reaching, locomotion, cleaning, community activity, driving,  PERSONAL FACTORS: see above PMH  also affecting patient's functional outcome.  REHAB POTENTIAL: Good  CLINICAL DECISION MAKING: Evolving/moderate complexity  EVALUATION COMPLEXITY: Moderate    GOALS: Short term PT Goals Target date: 04/28/2024   Pt will be I and compliant with HEP. Baseline:  Goal status: MET 04/18/24 Pt will decrease pain by 25% overall Baseline: Goal status: MET 04/18/24  Long term PT goals Target date:06/23/2024  Pt will improve right shoulder and elbow AROM to [facility] Timmons to improve functional mobility Baseline: Goal status: ongoing 09/08/24 Pt will improve  right arm strength to at least 4+/5 MMT to improve functional strength Baseline: Goal status: ongoing 09/08/24 Pt will improve Patient specific functional scale (PSFS) to at least 7/10 to show improved function level Baseline: Goal status: ongoing 09/08/24 Pt will reduce pain to overall less than 3/10 with usual activity Baseline: Goal status: ongoing 09/08/24 PLAN: PT FREQUENCY: 1-3 times per week   PT DURATION: 6-8 weeks  PLANNED INTERVENTIONS (unless contraindicated):  97110-Therapeutic exercises, 97530- Therapeutic activity, 97112- Neuromuscular re-education, 97535- Self Care, [id number]- Manual therapy, [id number]- Electrical stimulation (unattended), [id number]- Vasopneumatic device, [id number]- Ultrasound, and 97033- Ionotophoresis 4mg /ml Dexamethasone   PLAN FOR NEXT SESSION: progress strength and function as tolerated, compensatory strategies for deltoid  Massie Ada,  PT,DPT 09/12/2024, 3:15  PM   "

## 2024-09-14 ENCOUNTER — Ambulatory Visit: Admitting: Physical Therapy

## 2024-09-14 ENCOUNTER — Encounter: Payer: Self-pay | Admitting: Physical Therapy

## 2024-09-14 DIAGNOSIS — M6281 Muscle weakness (generalized): Secondary | ICD-10-CM | POA: Diagnosis not present

## 2024-09-14 DIAGNOSIS — M25512 Pain in left shoulder: Secondary | ICD-10-CM

## 2024-09-14 NOTE — Therapy (Signed)
 " OUTPATIENT PHYSICAL THERAPY TREATMENT Progress Note reporting period date 06/21/24 to 09/14/24  See below for objective and subjective measurements relating to patients progress with PT.    Patient Name: Randy Hebert MRN: [id number] DOB:09-05-1965, 59 y.o., male Today's Date: 09/14/2024  END OF SESSION:  PT End of Session - 09/14/24 0845     Visit Number 20    Number of Visits 24    Date for Recertification  10/11/24    Progress Note Due on Visit 20    PT Start Time 0845    PT Stop Time 0930    PT Time Calculation (min) 45 min    Activity Tolerance Patient tolerated treatment well    Behavior During Therapy [facility] for tasks assessed/performed              Past Medical History:  Diagnosis Date   ACE inhibitor-aggravated angioedema    Allergy     Anemia    Angio-edema    Anxiety    Arthritis    Asthma    hip replacement   Back pain    Bradycardia 04/28/2017   Bulging of cervical intervertebral disc    CAD (coronary artery disease)    lateral STEMI 02/06/2015 00% D1 occlusion treated with Promus Premier 2.5 mm x 16 mm DES, 70% ramus stenosis, 40% mid RCA stenosis, 45% distal RCA stenosis, EF 45-50%   CHF (congestive heart failure) (HCC)    COPD (chronic obstructive pulmonary disease) (HCC)    Depression    Diabetes mellitus without complication (HCC)    Difficult intubation    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   Dry eye    Dyspnea    Early satiety 09/23/2016   Fatty liver    GERD (gastroesophageal reflux disease)    HCAP (healthcare-associated pneumonia) 05/15/2017   Headache    Heart murmur    Hip pain    Hyperlipidemia    Hypertension    Lobar pneumonia 05/15/2017   Melena 08/04/2018   MI (myocardial infarction) [facility])    Myocardial infarction (HCC)    Neck pain    Non-ST elevation (NSTEMI) myocardial infarction (HCC) 04/27/2017   NSTEMI (non-ST elevated myocardial infarction) (HCC) 04/26/2017   Otitis media    Pleurisy     Pneumonia due to COVID-19 virus 10/07/2019   Rectal bleeding 11/08/2018   Right shoulder pain 03/20/2020   Sinus pause    9 sec sinus pause on telemetry after started on coreg  after MI, avoid AV nodal blocking agent   Sleep apnea    uses a cpap   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   STEMI (ST elevation myocardial infarction) (HCC) 05/31/2015   Substance abuse (HCC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   Syncope 06/29/2017   Syncope and collapse 05/15/2017   Transaminitis 08/04/2018   Unilateral primary osteoarthritis, right hip 07/01/2016   Past Surgical History:  Procedure Laterality Date   ARTERY EXPLORATION Left 03/24/2024   Procedure: LEFT ARCH AORTOGRAM, LEFT UPPER EXTREMITY ANGIOGRAM WITH FIRST ORDER CANNULATION, LEFT AXILLARY ANGIOPLASTY AND STENTING, EVACUATION OF LEFT AXILLARY HEMATOMA;  Surgeon: Magda Debby SAILOR, MD;  Location: [facility] OR;  Service: Vascular;  Laterality: Left;   ARTERY REPAIR Left 03/11/2024   Procedure: REPAIR LEFT AXILLARY ARTERY;  Surgeon: Serene Gaile ORN, MD;  Location: [facility] OR;  Service: Vascular;  Laterality: Left;  ARTERIAL REPAIR, LEFT AXILLARY   BIOPSY  10/09/2016   Procedure: BIOPSY;  Surgeon: Lamar CHRISTELLA Hollingshead, MD;  Location: [facility]  ENDO SUITE;  Service: Endoscopy;;   BIOPSY  11/28/2019   Procedure: BIOPSY;  Surgeon: Shaaron Lamar HERO, MD;  Location: [facility] ENDO SUITE;  Service: Endoscopy;;   BIOPSY  06/15/2023   Procedure: BIOPSY;  Surgeon: Shaaron Lamar HERO, MD;  Location: [facility] ENDO SUITE;  Service: Endoscopy;;   BRONCHIAL BIOPSY  10/16/2022   Procedure: BRONCHIAL BIOPSIES;  Surgeon: Gladis Leonor HERO, MD;  Location: [facility] ENDOSCOPY;  Service: Pulmonary;;   BRONCHIAL NEEDLE ASPIRATION BIOPSY  10/16/2022   Procedure: BRONCHIAL NEEDLE ASPIRATION BIOPSIES;  Surgeon: Gladis Leonor HERO, MD;  Location: [facility] ENDOSCOPY;  Service: Pulmonary;;   BRONCHIAL WASHINGS  10/16/2022   Procedure: BRONCHIAL WASHINGS;  Surgeon: Gladis Leonor HERO, MD;  Location: [facility] ENDOSCOPY;  Service: Pulmonary;;    CARDIAC CATHETERIZATION N/A 02/06/2015   Procedure: Left Heart Cath and Coronary Angiography;  Surgeon: Alm LELON Clay, MD;  Location: [facility] INVASIVE CV LAB;  Service: Cardiovascular;  Laterality: N/A;   CARDIAC CATHETERIZATION N/A 02/06/2015   Procedure: Coronary Stent Intervention;  Surgeon: Alm LELON Clay, MD;  Location: [facility] INVASIVE CV LAB;  Service: Cardiovascular;  Laterality: N/A;   COLONOSCOPY N/A 09/07/2024   Procedure: COLONOSCOPY;  Surgeon: Shaaron Lamar HERO, MD;  [facility] ENDO SUITE;  Service: Endoscopy;  Laterality: N/A;  11:30am, asa 3   COLONOSCOPY WITH PROPOFOL  N/A 10/09/2016   Sigmoid and descending colon diverticulosis, four 4-6 mm polyps in sigmoid, one 4 mm polyp in descending. Tubular adenomas and hyperplastic. 5 year surveillance.    COLONOSCOPY WITH PROPOFOL  N/A 11/24/2016   Sigmoid and descending colon diverticulosis, four 4-6 mm polyps in sigmoid, one 4 mm polyp in descending. Tubular adenomas and hyperplastic. 5 year surveillance.    COLONOSCOPY WITH PROPOFOL  N/A 10/18/2021   Carver: nonbleeding internal hemorrhoids, small and large mouth diverticula found in the sigmoid, descending, transverse colon.  A 9 mm sessile polyp was found in the ascending colon that was removed.  4 sessile polyps found in the sigmoid, descending, transverse colon 3 to 5 mm in size, examining otherwise.  Path revealed tubular adenomas.  Repeat due in 5 years for surveillance.   CORONARY ANGIOPLASTY WITH STENT PLACEMENT  01/2015   CORONARY STENT INTERVENTION N/A 04/27/2017   Procedure: CORONARY STENT INTERVENTION;  Surgeon: Mady Bruckner, MD;  Location: [facility] INVASIVE CV LAB;  Service: Cardiovascular;  Laterality: N/A;   ELECTROPHYSIOLOGY STUDY N/A 06/29/2017   Procedure: ELECTROPHYSIOLOGY STUDY;  Surgeon: Waddell Danelle LELON, MD;  Location: [facility] INVASIVE CV LAB;  Service: Cardiovascular;  Laterality: N/A;   ESOPHAGOGASTRODUODENOSCOPY (EGD) WITH PROPOFOL  N/A 10/09/2016   Dr. Shaaron: LA grade a  esophagitis.  Barrett's esophagus, biopsy-proven.  Small hiatal hernia.  EGD February 2019   ESOPHAGOGASTRODUODENOSCOPY (EGD) WITH PROPOFOL  N/A 11/28/2019    salmon-colored esophageal mucosa (Barrett's) small hiatal hernia, portal hypertensive gastropathy, normal duodenum, 3 year surveillance   ESOPHAGOGASTRODUODENOSCOPY (EGD) WITH PROPOFOL  N/A 09/06/2021   three columns of grade 1 varices in distal esophagus, no stigmata of bleeding or red wale signs. Small hiatal hernia. Mild portal gastropathy. Normal duodenum. Repeat upper endoscopy in 1 year for surveillance.   ESOPHAGOGASTRODUODENOSCOPY (EGD) WITH PROPOFOL  N/A 06/15/2023   Procedure: ESOPHAGOGASTRODUODENOSCOPY (EGD) WITH PROPOFOL ;  Surgeon: Shaaron Lamar HERO, MD;  Location[facility] ENDO SUITE;  Service: Endoscopy;  Laterality: N/A;  830am, asa 3   FIDUCIAL MARKER PLACEMENT  10/16/2022   Procedure: FIDUCIAL MARKER PLACEMENT;  Surgeon: Gladis Leonor HERO, MD;  Location: [facility] ENDOSCOPY;  Service: Pulmonary;;   INCISION / DRAINAGE HAND / FINGER  LEFT HEART CATH AND CORONARY ANGIOGRAPHY N/A 04/27/2017   Procedure: LEFT HEART CATH AND CORONARY ANGIOGRAPHY;  Surgeon: Mady Bruckner, MD;  Location: [facility] INVASIVE CV LAB;  Service: Cardiovascular;  Laterality: N/A;   LOOP RECORDER INSERTION  06/29/2017   Procedure: Loop Recorder Insertion;  Surgeon: Waddell Danelle ORN, MD;  Location: [facility] INVASIVE CV LAB;  Service: Cardiovascular;;   POLYPECTOMY  11/24/2016   Procedure: POLYPECTOMY;  Surgeon: Lamar CHRISTELLA Hollingshead, MD;  [facility] ENDO SU[facility];  Service: Endoscopy;;  descending and sigmoid   POLYPECTOMY  10/18/2021   Procedure: POLYPECTOMY;  Surgeon: Cindie Carlin POUR, DO;  Location: [facility] ENDO SUITE;  Service: Endoscopy;;   TOTAL HIP ARTHROPLASTY Right 07/01/2016   TOTAL HIP ARTHROPLASTY Right 07/01/2016   Procedure: RIGHT TOTAL HIP ARTHROPLASTY ANTERIOR APPROACH;  Surgeon: Bruckner CINDERELLA Poli, MD;  Location: [facility] OR;  Service: Orthopedics;  Laterality: Right;    ULTRASOUND GUIDANCE FOR VASCULAR ACCESS Left 03/24/2024   Procedure: ULTRASOUND GUIDANCE, FOR VASCULAR ACCESS;  Surgeon: Magda Debby SAILOR, MD;  Location: [facility] OR;  Service: Vascular;  Laterality: Left;   Patient Active Problem List   Diagnosis Date Noted   Pain of left upper extremity 04/24/2024   Acute postoperative anemia due to greater than expected blood loss 03/26/2024   Obesity, class 1 03/26/2024   Aneurysm of axillary artery 03/24/2024   Axillary artery injury 03/12/2024   Acute CVA (cerebrovascular accident) (HCC) 03/02/2024   Essential hypertension 03/02/2024   Dyslipidemia 03/02/2024   Type 2 diabetes mellitus with hyperlipidemia (HCC) 03/02/2024   Erectile dysfunction 02/24/2023   Chronic eczematous otitis externa of both ears 02/05/2023   Tobacco abuse 12/25/2022   Malignant neoplasm of upper lobe of right lung (HCC) 09/25/2022   Cervical spondylosis with myelopathy 08/01/2022   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   DDD (degenerative disc disease), cervical 05/21/2022   Osteoarthritis of both hands 05/19/2022   History of GI bleed 11/13/2021   Insomnia 09/13/2021   Cirrhosis of liver (HCC) 11/08/2018   Barrett's esophagus 08/04/2018   Vitamin D  deficiency 06/30/2018   Alcohol  abuse 06/30/2018   DM type 2 causing vascular disease (HCC) 08/17/2017   Chronic combined systolic and diastolic CHF (congestive heart failure) (HCC) 05/15/2017   OSA (obstructive sleep apnea) 04/28/2017   Depression with anxiety 04/15/2017   GERD (gastroesophageal reflux disease) 09/23/2016   Asthmatic bronchitis , chronic (HCC) 08/27/2015   Mixed hyperlipidemia 08/27/2015   Class 1 obesity due to excess calories with serious comorbidity and body mass index (BMI) of 32.0 to 32.9 in adult 08/27/2015   CAD (coronary artery disease) 05/31/2015   Essential hypertension, benign 02/09/2015    PCP: Cook, Jayce G, DO   REFERRING PROVIDER: Noralee Elidia Sieving*   REFERRING DIAG: left axillary  pseudoaneurysm   Rationale for Evaluation and Treatment:  Rehabiliation  THERAPY DIAG:  Muscle weakness (generalized)  Acute pain of left shoulder  ONSET DATE: 03/11/24   SUBJECTIVE:  SUBJECTIVE STATEMENT: He relays his arm is sore today Overall he feels it is at 30% functioning now since injury    PERTINENT HISTORY:  past medical history of diabetes mellitus type 2, hypertension, CAD, liver cirrhosis, anxiety, depression, OSA, CVA w/ left-sided weakness who had a recent hospitalization 7/11 to 03/19/24 post traumatic left shoulder dislocation, complicated with left axillary hematoma with left axillary pseudoaneurysm. On 07/12 he underwent open exposure and repair of the left axillary pseudoaneurysm and evacuation of left axillary hematoma.   PAIN:  NPRS scale: 1/10 Pain location:left shoulder/axillary, left forearm and wrist Pain description: constant, achy, sharp Aggravating factors: any use of this left arm. Relieving factors: rest, meds   PRECAUTIONS: ,  Shoulder  RED FLAGS: None   WEIGHT BEARING RESTRICTIONS:  Not anymore  FALLS:  Has patient fallen in last 6 months? Yes. Number of falls 1   OCCUPATION:  disabled  PLOF:  Independent with basic ADLs  PATIENT GOALS:  Reduce pain and get his arm back to normal  OBJECTIVE:  Note: Objective measures were completed at Evaluation unless otherwise noted.  DIAGNOSTIC FINDINGS:  Chest CT IMPRESSION: 1. Enlarging left axillary hematoma with arterial contrast extravasation from the posterior wall of the left axillary artery into a 1.5 x 1.8 cm pseudoaneurysm, with additional 1.8 x 1.1 cm pocket of contrast more posteriorly within the collection. 2. Increased heterogeneous hematoma in the left subscapularis region measuring 7.3 x  4.8 cm, but no focal contrast is seen within this. 3. Emphysema. 4. Right apical fiducial markers and post XRT changes, with treated nodule just above the fiducial markers today measuring 8 mm, previously 9 mm. 5. Aortic and coronary artery atherosclerosis.  Right aortic arch. 6. Enlarged pulmonary trunk 3.3 cm indicating arterial hypertension. 7. Cirrhosis and splenomegaly.  Prominent patent portal vein 1.7 cm. 8. Critical Value/emergent results were called by telephone at the time of interpretation on 03/24/2024 at 5:18 am to provider [facility] , who verbally acknowledged these results.  Shoulder XR IMPRESSION: No acute bony abnormality.   Postoperative changes in the left axilla.  No imaging for left elbow/wrist  06/03/24 EMG & NCS Examination  Impression: 1. This is an abnormal electrodiagnostic study of the left upper extremity 2. There is electrodiagnostic evidence of a diffuse, subacute brachial plexopathy affecting the upper, middle, and lower trunks of the left upper extremity with widespread denervation changes indicating ongoing axonal injury   PATIENT SURVEYS:  Patient-Specific Activity Scoring Scheme  0 represents unable to perform. 10 represents able to perform at prior level. 0 1 2 3 4 5 6 7 8 9  10 (Date and Score)   Activity Eval  06/21/24 09/14/24  1. Raising left arm  0 0  3  2. Putting on and off shirt 1  8 8   3.      4.     5.     Score 0.5/10 4/10 5.5/10   Total score = sum of the activity scores/number of activities Minimum detectable change (90%CI) for average score = 2 points Minimum detectable change (90%CI) for single activity score = 3 points     EDEMA:  Yes: moderate edema noted in left upper extremity from shoulder to hand  Observation:  2 incisions noted, one at proximal chest, one at axilla, both incisions closed with stitches and appear closed without any drainage or signs of infection at the moment. Dressing was removed and  changed for him at eval.     UPPER EXTREMITY ROM:  AROM/Passive ROM Left eval Left 04/13/24 Left 05/24/24 Left 06/21/24 Left 08/16/24 Left 09/14/24  Shoulder flexion /50 /120 30/150 40/ 40/WFL 40/WFL  Shoulder extension        Shoulder abduction /50 /100 20/140 22/ 22/WFL 22/WFL  Shoulder adduction        Shoulder extension        Shoulder internal rotation /50    L2 behind back in standing / L1 behind back/  Shoulder external rotation /40 /70   To ear in standing/ To ear in standing/  Elbow flexion        Elbow extension        Wrist flexion        Wrist extension        Wrist ulnar deviation        Wrist radial deviation        Wrist pronation        Wrist supination         (Blank rows = not tested)   UPPER EXTREMITY MMT:  MMT Left eval Left 05/24/24 Left 06/21/24 Left 07/19/24 Left 08/16/24 Left 09/14/24  Shoulder flexion 1 2 2 2 2 2   Shoulder extension        Shoulder abduction 1 2 2 2 2 2   Shoulder adduction        Shoulder extension        Shoulder internal rotation 1 2 4 4 4  4+  Shoulder external rotation 1 2 3  3+ 3+ 4-  Middle trapezius        Lower trapezius        Elbow flexion 1 3 4 4 4 4   Elbow extension 2 3 4 4 4 4   Wrist flexion        Wrist extension        Wrist ulnar deviation        Wrist radial deviation        Wrist pronation        Wrist supination        Grip strength     3 3+   (Blank rows = not tested)                                                                                                                              TREATMENT DATE:   09/15/23 Theractivity (ROM and strength for reaching, push/pull, gripping) UBE L2 X 10 min , push/pull Wall ladder X 10 reps flexion, X 5 reps scaption Pulleys X 15 reps flexion with slow eccentrics Standing deltoid flexion isometric pushing into green ball 5 sec X 10, and abduction isometric 5 sec X 10 Seated bicep curls 3# X 15 reps Standing chest press green X 15 Standing IR red X  15 Standing shoulder adduction red X 15 Standing shoulder row red X 15 Standing shoulder extension red X 15 Standing ER red X 15  09/13/23 Theractivity (ROM and strength for reaching, push/pull, gripping) UBE  L2 X 10 min , push/pull Wall ladder X 10 reps flexion, X 5 reps scaption Pulleys X 15 reps flexion with slow eccentrics Standing deltoid flexion isometric pushing into green ball 5 sec X 10, and abduction isometric 5 sec X 10 Seated bicep curls 5# X 5 reps, then 3# X 10 reps Standing chest press green X 15 Standing IR green X 15 Standing shoulder adduction Green X 15 Standing shoulder row Green X 15 Standing shoulder extension Green X 15 Standing ER Green X 15 Supine shoulder flexion AAROM with cane X 10 with slow eccentrics Supine shoulder abduction AAROM with cane X 10 with slow eccentrics Supine shoulder ER AAROM X 10 reps   PATIENT EDUCATION: Education details: HEP, PT plan of care, selfcare Person educated: Patient Education method: Explanation, Demonstration, Verbal cues, and Handouts Education comprehension: verbalized understanding, further education recommended   HOME EXERCISE PROGRAM: Access Code: [email or web address] URL: [email or web address] Date: 03/31/2024 Prepared by: Redell Moose  Exercises - Circular Shoulder Pendulum with Table Support  - 2-3 x daily - 7 x weekly - 1 sets - 15 reps - Seated Shoulder Abduction Towel Slide at Table Top  - 2-3 x daily - 6 x weekly - 1 sets - 10 reps - 5 sec hold - Seated Shoulder Flexion Towel Slide at Table Top  - 2-3 x daily - 6 x weekly - 1 sets - 10 reps - 5 sec hold - Seated Shoulder External Rotation PROM on Table  - 2-3 x daily - 6 x weekly - 1 sets - 10 reps - 5 sec hold - Seated Gripping Towel  - 2-3 x daily - 6 x weekly - 1 sets - 20 reps - Standing Elbow Flexion Extension AROM  - 2-3 x daily - 6 x weekly - 1 sets - 10 reps  ASSESSMENT: 10th visit progress note shows he is making good progress with his  elbow and hand function but slight progress with shoulder function due to axillary nerve injury. PT recommending to continue PT as he still have limited functional use of his left UE. Short term PT goals met and long term PT goals ongoing.  OBJECTIVE IMPAIRMENTS: decreased activity tolerance for ADL's, difficulty walking, decreased balance, decreased endurance, decreased mobility, decreased ROM, decreased strength, impaired flexibility, impaired UE use, and pain.  ACTIVITY LIMITATIONS: bending, lifting, carry, reaching, locomotion, cleaning, community activity, driving,  PERSONAL FACTORS: see above PMH  also affecting patient's functional outcome.  REHAB POTENTIAL: Good  CLINICAL DECISION MAKING: Evolving/moderate complexity  EVALUATION COMPLEXITY: Moderate    GOALS: Short term PT Goals Target date: 04/28/2024   Pt will be I and compliant with HEP. Baseline:  Goal status: MET 04/18/24 Pt will decrease pain by 25% overall Baseline: Goal status: MET 04/18/24  Long term PT goals Target date:10/11/24  Pt will improve right shoulder and elbow AROM to [facility] to improve functional mobility Baseline: Goal status: ongoing 09/14/24 Pt will improve  right arm strength to at least 4+/5 MMT to improve functional strength Baseline: Goal status: ongoing 09/14/24 Pt will improve Patient specific functional scale (PSFS) to at least 7/10 to show improved function level Baseline: Goal status: ongoing 09/14/24 Pt will reduce pain to overall less than 3/10 with usual activity Baseline: Goal status: ongoing 09/14/24 PLAN: PT FREQUENCY: 1-3 times per week   PT DURATION: 6-8 weeks  PLANNED INTERVENTIONS (unless contraindicated):  97110-Therapeutic exercises, 97530- Therapeutic activity, 97112- Neuromuscular re-education, 97535- Self Care, 02859- Manual therapy, H9716- Electrical stimulation (unattended), 97016- Vasopneumatic device, [id number]-  Ultrasound, and 02966- Ionotophoresis 4mg /ml  Dexamethasone   PLAN FOR NEXT SESSION: progress strength and function as tolerated, compensatory strategies for deltoid  Redell JONELLE Moose, PT,DPT 09/14/2024, 9:00 AM   "

## 2024-09-15 ENCOUNTER — Other Ambulatory Visit: Payer: Self-pay | Admitting: "Endocrinology

## 2024-09-15 DIAGNOSIS — E1159 Type 2 diabetes mellitus with other circulatory complications: Secondary | ICD-10-CM

## 2024-09-16 ENCOUNTER — Ambulatory Visit (INDEPENDENT_AMBULATORY_CARE_PROVIDER_SITE_OTHER): Admitting: Nurse Practitioner

## 2024-09-16 ENCOUNTER — Other Ambulatory Visit: Payer: Self-pay | Admitting: *Deleted

## 2024-09-16 VITALS — BP 124/71 | HR 86 | Temp 97.8°F | Ht 65.0 in | Wt 186.2 lb

## 2024-09-16 DIAGNOSIS — J069 Acute upper respiratory infection, unspecified: Secondary | ICD-10-CM | POA: Diagnosis not present

## 2024-09-16 DIAGNOSIS — K7030 Alcoholic cirrhosis of liver without ascites: Secondary | ICD-10-CM

## 2024-09-16 DIAGNOSIS — H60542 Acute eczematoid otitis externa, left ear: Secondary | ICD-10-CM | POA: Diagnosis not present

## 2024-09-16 DIAGNOSIS — Z720 Tobacco use: Secondary | ICD-10-CM

## 2024-09-16 DIAGNOSIS — K7460 Unspecified cirrhosis of liver: Secondary | ICD-10-CM

## 2024-09-16 DIAGNOSIS — J4520 Mild intermittent asthma, uncomplicated: Secondary | ICD-10-CM

## 2024-09-16 DIAGNOSIS — M9512 Cauliflower ear, left ear: Secondary | ICD-10-CM | POA: Diagnosis not present

## 2024-09-16 DIAGNOSIS — B9689 Other specified bacterial agents as the cause of diseases classified elsewhere: Secondary | ICD-10-CM | POA: Diagnosis not present

## 2024-09-16 MED ORDER — FLUTICASONE PROPIONATE 50 MCG/ACT NA SUSP: 2.0000 | Freq: Every day | NASAL | 5 refills | Status: AC

## 2024-09-16 MED ORDER — TRIAMCINOLONE ACETONIDE 0.1 % EX CREA: 1.0000 | TOPICAL_CREAM | Freq: Two times a day (BID) | CUTANEOUS | 0 refills | Status: AC

## 2024-09-16 MED ORDER — AZITHROMYCIN 250 MG PO TABS: ORAL_TABLET | ORAL | 0 refills | Status: AC

## 2024-09-16 MED ORDER — PROMETHAZINE-DM 6.25-15 MG/5ML PO SYRP: ORAL_SOLUTION | ORAL | 0 refills | Status: AC

## 2024-09-16 NOTE — Progress Notes (Unsigned)
" ° °  Subjective:    Patient ID: Randy Hebert, male    DOB: 1966/06/08, 59 y.o.   MRN: [id number]  HPI Patient is in room 6  Patient is her for having sinus issues/coughing/runny nose/ and watery eyes for about a month. Has took over the counter meds and doesn't help really   Review of Systems     Objective:   Physical Exam        Assessment & Plan:   Assessment and Plan Assessment & Plan Acute upper respiratory infection with bronchitis Symptoms include watery eyes, rhinorrhea, dry cough, and sinus pressure. Cough worsens at night. Wheezing present. Smoking increases risk for complications. Prednisone  avoided due to diabetes. - Prescribed azithromycin  (Z-Pak) for potential sinus bronchitis. - Restart Symbicort  inhaler during weather changes or colds. - Prescribed nighttime cough syrup to aid sleep. - Refilled Flonase  nasal spray to reduce nasal congestion. - Advised to seek medical attention if symptoms worsen, such as fever, chest pain, or increased wheezing.  Asthma Wheezing exacerbated by weather changes and smoking. Irregular use of Symbicort  inhaler contributes to symptoms. - Restart Symbicort  inhaler during weather changes or colds. - Advised to use inhaler when experiencing coughing or congestion.  Type 2 diabetes mellitus Prednisone  avoided due to impact on blood glucose levels. - Avoid prednisone  unless absolutely necessary due to diabetes.  Nicotine  dependence, cigarettes Currently smoking about a pack and a half per day. Advised to cut back to reduce health risks. - Advised to gradually reduce smoking, even if not quitting entirely.  Chronic dermatitis of external ear Chronic dermatitis with itching and rash on the external ear. Triamcinolone  cream effective for managing symptoms. - Prescribed triamcinolone  cream for external ear dermatitis. - Instructed to apply cream to the external ear and use a Q-tip for internal application if needed.   "

## 2024-09-16 NOTE — Progress Notes (Signed)
 error

## 2024-09-17 ENCOUNTER — Encounter: Payer: Self-pay | Admitting: Nurse Practitioner

## 2024-09-17 DIAGNOSIS — H60542 Acute eczematoid otitis externa, left ear: Secondary | ICD-10-CM | POA: Insufficient documentation

## 2024-09-17 DIAGNOSIS — M9512 Cauliflower ear, left ear: Secondary | ICD-10-CM | POA: Insufficient documentation

## 2024-09-17 DIAGNOSIS — J4520 Mild intermittent asthma, uncomplicated: Secondary | ICD-10-CM | POA: Insufficient documentation

## 2024-09-19 ENCOUNTER — Ambulatory Visit

## 2024-09-19 DIAGNOSIS — M6281 Muscle weakness (generalized): Secondary | ICD-10-CM

## 2024-09-19 DIAGNOSIS — M25512 Pain in left shoulder: Secondary | ICD-10-CM

## 2024-09-19 NOTE — Therapy (Signed)
 " OUTPATIENT PHYSICAL THERAPY TREATMENT   See below for objective and subjective measurements relating to patients progress with PT.    Patient Name: Randy Hebert MRN: [id number] DOB:[birth year], 59 y.o., male Today's Date: 09/19/2024  END OF SESSION:  PT End of Session - 09/19/24 0759     Visit Number 21    Number of Visits 24    Date for Recertification  10/11/24    Progress Note Due on Visit 20    PT Start Time 0757    PT Stop Time 0845    PT Time Calculation (min) 48 min    Activity Tolerance Patient tolerated treatment well    Behavior During Therapy [facility] for tasks assessed/performed              Past Medical History:  Diagnosis Date   ACE inhibitor-aggravated angioedema    Allergy     Anemia    Angio-edema    Anxiety    Arthritis    Asthma    hip replacement   Back pain    Bradycardia 04/28/2017   Bulging of cervical intervertebral disc    CAD (coronary artery disease)    lateral STEMI 02/06/2015 00% D1 occlusion treated with Promus Premier 2.5 mm x 16 mm DES, 70% ramus stenosis, 40% mid RCA stenosis, 45% distal RCA stenosis, EF 45-50%   CHF (congestive heart failure) (HCC)    COPD (chronic obstructive pulmonary disease) (HCC)    Depression    Diabetes mellitus without complication (HCC)    Difficult intubation    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   Dry eye    Dyspnea    Early satiety 09/23/2016   Eczema of left external ear 09/17/2024   Fatty liver    GERD (gastroesophageal reflux disease)    HCAP (healthcare-associated pneumonia) 05/15/2017   Headache    Heart murmur    Hip pain    Hyperlipidemia    Hypertension    Lobar pneumonia 05/15/2017   Melena 08/04/2018   MI (myocardial infarction) [facility])    Myocardial infarction (HCC)    Neck pain    Non-ST elevation (NSTEMI) myocardial infarction (HCC) 04/27/2017   NSTEMI (non-ST elevated myocardial infarction) (HCC) 04/26/2017   Otitis media    Pleurisy    Pneumonia due to  COVID-19 virus 10/07/2019   Rectal bleeding 11/08/2018   Right shoulder pain 03/20/2020   Sinus pause    9 sec sinus pause on telemetry after started on coreg  after MI, avoid AV nodal blocking agent   Sleep apnea    uses a cpap   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   STEMI (ST elevation myocardial infarction) (HCC) 05/31/2015   Substance abuse (HCC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   Syncope 06/29/2017   Syncope and collapse 05/15/2017   Transaminitis 08/04/2018   Unilateral primary osteoarthritis, right hip 07/01/2016   Past Surgical History:  Procedure Laterality Date   ARTERY EXPLORATION Left 03/24/2024   Procedure: LEFT ARCH AORTOGRAM, LEFT UPPER EXTREMITY ANGIOGRAM WITH FIRST ORDER CANNULATION, LEFT AXILLARY ANGIOPLASTY AND STENTING, EVACUATION OF LEFT AXILLARY HEMATOMA;  Surgeon: Magda Debby SAILOR, MD;  Location: [facility] OR;  Service: Vascular;  Laterality: Left;   ARTERY REPAIR Left 03/11/2024   Procedure: REPAIR LEFT AXILLARY ARTERY;  Surgeon: Serene Gaile ORN, MD;  Location: [facility] OR;  Service: Vascular;  Laterality: Left;  ARTERIAL REPAIR, LEFT AXILLARY   BIOPSY  10/09/2016   Procedure: BIOPSY;  Surgeon: Lamar CHRISTELLA Hollingshead, MD;  Location:  [facility] ENDO [facility];  Service: Endoscopy;;   BIOPSY  11/28/2019   Procedure: BIOPSY;  Surgeon: Shaaron Lamar HERO, MD;  Location: [facility];  Service: Endoscopy;;   BIOPSY  06/15/2023   Procedure: BIOPSY;  Surgeon: Shaaron Lamar HERO, MD;  Location: [facility];  Service: Endoscopy;;   BRONCHIAL BIOPSY  10/16/2022   Procedure: BRONCHIAL BIOPSIES;  Surgeon: Gladis Leonor HERO, MD;  Location: [facility] ENDOSCOPY;  Service: Pulmonary;;   BRONCHIAL NEEDLE ASPIRATION BIOPSY  10/16/2022   Procedure: BRONCHIAL NEEDLE ASPIRATION BIOPSIES;  Surgeon: Gladis Leonor HERO, MD;  Location: [facility] ENDOSCOPY;  Service: Pulmonary;;   BRONCHIAL WASHINGS  10/16/2022   Procedure: BRONCHIAL WASHINGS;  Surgeon: Gladis Leonor HERO, MD;  Location: [facility] ENDOSCOPY;  Service: Pulmonary;;   CARDIAC  CATHETERIZATION N/A 02/06/2015   Procedure: Left Heart Cath and Coronary Angiography;  Surgeon: Alm LELON Clay, MD;  Location: [facility] INVASIVE CV LAB;  Service: Cardiovascular;  Laterality: N/A;   CARDIAC CATHETERIZATION N/A 02/06/2015   Procedure: Coronary Stent Intervention;  Surgeon: Alm LELON Clay, MD;  Location: [facility] INVASIVE CV LAB;  Service: Cardiovascular;  Laterality: N/A;   COLONOSCOPY N/A 09/07/2024   Procedure: COLONOSCOPY;  Surgeon: Shaaron Lamar HERO, MD;  [facility]: [facility];  Service: Endoscopy;  Laterality: N/A;  11:30am, asa 3   COLONOSCOPY WITH PROPOFOL  N/A 10/09/2016   Sigmoid and descending colon diverticulosis, four 4-6 mm polyps in sigmoid, one 4 mm polyp in descending. Tubular adenomas and hyperplastic. 5 year surveillance.    COLONOSCOPY WITH PROPOFOL  N/A 11/24/2016   Sigmoid and descending colon diverticulosis, four 4-6 mm polyps in sigmoid, one 4 mm polyp in descending. Tubular adenomas and hyperplastic. 5 year surveillance.    COLONOSCOPY WITH PROPOFOL  N/A 10/18/2021   Carver: nonbleeding internal hemorrhoids, small and large mouth diverticula found in the sigmoid, descending, transverse colon.  A 9 mm sessile polyp was found in the ascending colon that was removed.  4 sessile polyps found in the sigmoid, descending, transverse colon 3 to 5 mm in size, examining otherwise.  Path revealed tubular adenomas.  Repeat due in 5 years for surveillance.   CORONARY ANGIOPLASTY WITH STENT PLACEMENT  01/2015   CORONARY STENT INTERVENTION N/A 04/27/2017   Procedure: CORONARY STENT INTERVENTION;  Surgeon: Mady Bruckner, MD;  Location: [facility] INVASIVE CV LAB;  Service: Cardiovascular;  Laterality: N/A;   ELECTROPHYSIOLOGY STUDY N/A 06/29/2017   Procedure: ELECTROPHYSIOLOGY STUDY;  Surgeon: Waddell Danelle LELON, MD;  Location: [facility] INVASIVE CV LAB;  Service: Cardiovascular;  Laterality: N/A;   ESOPHAGOGASTRODUODENOSCOPY (EGD) WITH PROPOFOL  N/A 10/09/2016   Dr. Shaaron: LA grade a esophagitis.   Barrett's esophagus, biopsy-proven.  Small hiatal hernia.  EGD February 2019   ESOPHAGOGASTRODUODENOSCOPY (EGD) WITH PROPOFOL  N/A 11/28/2019    salmon-colored esophageal mucosa (Barrett's) small hiatal hernia, portal hypertensive gastropathy, normal duodenum, 3 year surveillance   ESOPHAGOGASTRODUODENOSCOPY (EGD) WITH PROPOFOL  N/A 09/06/2021   three columns of grade 1 varices in distal esophagus, no stigmata of bleeding or red wale signs. Small hiatal hernia. Mild portal gastropathy. Normal duodenum. Repeat upper endoscopy in 1 year for surveillance.   ESOPHAGOGASTRODUODENOSCOPY (EGD) WITH PROPOFOL  N/A 06/15/2023   Procedure: ESOPHAGOGASTRODUODENOSCOPY (EGD) WITH PROPOFOL ;  Surgeon: Shaaron Lamar HERO, MD;  [facility];  Service: Endoscopy;  Laterality: N/A;  830am, asa 3   FIDUCIAL MARKER PLACEMENT  10/16/2022   Procedure: FIDUCIAL MARKER PLACEMENT;  Surgeon: Gladis Leonor HERO, MD;  Location: [facility] ENDOSCOPY;  Service: Pulmonary;;   INCISION / DRAINAGE HAND / FINGER  LEFT HEART CATH AND CORONARY ANGIOGRAPHY N/A 04/27/2017   Procedure: LEFT HEART CATH AND CORONARY ANGIOGRAPHY;  Surgeon: Mady Bruckner, MD;  Location: [facility] INVASIVE CV LAB;  Service: Cardiovascular;  Laterality: N/A;   LOOP RECORDER INSERTION  06/29/2017   Procedure: Loop Recorder Insertion;  Surgeon: Waddell Danelle ORN, MD;  Location: [facility] INVASIVE CV LAB;  Service: Cardiovascular;;   POLYPECTOMY  11/24/2016   Procedure: POLYPECTOMY;  Surgeon: Lamar CHRISTELLA Hollingshead, MD;  [facility];  Service: Endoscopy;;  descending and sigmoid   POLYPECTOMY  10/18/2021   Procedure: POLYPECTOMY;  Surgeon: Cindie Carlin POUR, DO;  Location: [facility];  Service: Endoscopy;;   TOTAL HIP ARTHROPLASTY Right 07/01/2016   TOTAL HIP ARTHROPLASTY Right 07/01/2016   Procedure: RIGHT TOTAL HIP ARTHROPLASTY ANTERIOR APPROACH;  Surgeon: Bruckner CINDERELLA Poli, MD;  Location: [facility] OR;  Service: Orthopedics;  Laterality: Right;   ULTRASOUND GUIDANCE  FOR VASCULAR ACCESS Left 03/24/2024   Procedure: ULTRASOUND GUIDANCE, FOR VASCULAR ACCESS;  Surgeon: Magda Debby SAILOR, MD;  Location: [facility] OR;  Service: Vascular;  Laterality: Left;   Patient Active Problem List   Diagnosis Date Noted   Cauliflower left ear 09/17/2024   Eczema of left external ear 09/17/2024   Mild intermittent asthma without complication 09/17/2024   Pain of left upper extremity 04/24/2024   Acute postoperative anemia due to greater than expected blood loss 03/26/2024   Obesity, class 1 03/26/2024   Aneurysm of axillary artery 03/24/2024   Axillary artery injury 03/12/2024   Acute CVA (cerebrovascular accident) (HCC) 03/02/2024   Essential hypertension 03/02/2024   Dyslipidemia 03/02/2024   Type 2 diabetes mellitus with hyperlipidemia (HCC) 03/02/2024   Erectile dysfunction 02/24/2023   Chronic eczematous otitis externa of both ears 02/05/2023   Tobacco abuse 12/25/2022   Malignant neoplasm of upper lobe of right lung (HCC) 09/25/2022   Cervical spondylosis with myelopathy 08/01/2022   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   DDD (degenerative disc disease), cervical 05/21/2022   Osteoarthritis of both hands 05/19/2022   History of GI bleed 11/13/2021   Insomnia 09/13/2021   Cirrhosis of liver (HCC) 11/08/2018   Barrett's esophagus 08/04/2018   Vitamin D  deficiency 06/30/2018   Alcohol  abuse 06/30/2018   DM type 2 causing vascular disease (HCC) 08/17/2017   Chronic combined systolic and diastolic CHF (congestive heart failure) (HCC) 05/15/2017   OSA (obstructive sleep apnea) 04/28/2017   Depression with anxiety 04/15/2017   GERD (gastroesophageal reflux disease) 09/23/2016   Asthmatic bronchitis , chronic (HCC) 08/27/2015   Mixed hyperlipidemia 08/27/2015   Class 1 obesity due to excess calories with serious comorbidity and body mass index (BMI) of 32.0 to 32.9 in adult 08/27/2015   CAD (coronary artery disease) 05/31/2015   Essential hypertension, benign 02/09/2015     PCP: Cook, Jayce G, DO   REFERRING PROVIDER: Noralee Elidia Sieving*   REFERRING DIAG: left axillary pseudoaneurysm   Rationale for Evaluation and Treatment:  Rehabiliation  THERAPY DIAG:  Muscle weakness (generalized)  Acute pain of left shoulder  ONSET DATE: 03/11/24   SUBJECTIVE:  SUBJECTIVE STATEMENT: Pt reports doing well over all except being under the weather over the weekend.    PERTINENT HISTORY:  past medical history of diabetes mellitus type 2, hypertension, CAD, liver cirrhosis, anxiety, depression, OSA, CVA w/ left-sided weakness who had a recent hospitalization 7/11 to 03/19/24 post traumatic left shoulder dislocation, complicated with left axillary hematoma with left axillary pseudoaneurysm. On 07/12 he underwent open exposure and repair of the left axillary pseudoaneurysm and evacuation of left axillary hematoma.   PAIN:  NPRS scale: 1/10 Pain location:left shoulder/axillary, left forearm and wrist Pain description: constant, achy, sharp Aggravating factors: any use of this left arm. Relieving factors: rest, meds   PRECAUTIONS: ,  Shoulder  RED FLAGS: None   WEIGHT BEARING RESTRICTIONS:  Not anymore  FALLS:  Has patient fallen in last 6 months? Yes. Number of falls 1   OCCUPATION:  disabled  PLOF:  Independent with basic ADLs  PATIENT GOALS:  Reduce pain and get his arm back to normal  OBJECTIVE:  Note: Objective measures were completed at Evaluation unless otherwise noted.  DIAGNOSTIC FINDINGS:  Chest CT IMPRESSION: 1. Enlarging left axillary hematoma with arterial contrast extravasation from the posterior wall of the left axillary artery into a 1.5 x 1.8 cm pseudoaneurysm, with additional 1.8 x 1.1 cm pocket of contrast more posteriorly within  the collection. 2. Increased heterogeneous hematoma in the left subscapularis region measuring 7.3 x 4.8 cm, but no focal contrast is seen within this. 3. Emphysema. 4. Right apical fiducial markers and post XRT changes, with treated nodule just above the fiducial markers today measuring 8 mm, previously 9 mm. 5. Aortic and coronary artery atherosclerosis.  Right aortic arch. 6. Enlarged pulmonary trunk 3.3 cm indicating arterial hypertension. 7. Cirrhosis and splenomegaly.  Prominent patent portal vein 1.7 cm. 8. Critical Value/emergent results were called by telephone at the time of interpretation on 03/24/2024 at 5:18 am to provider [facility] , who verbally acknowledged these results.  Shoulder XR IMPRESSION: No acute bony abnormality.   Postoperative changes in the left axilla.  No imaging for left elbow/wrist  06/03/24 EMG & NCS Examination  Impression: 1. This is an abnormal electrodiagnostic study of the left upper extremity 2. There is electrodiagnostic evidence of a diffuse, subacute brachial plexopathy affecting the upper, middle, and lower trunks of the left upper extremity with widespread denervation changes indicating ongoing axonal injury   PATIENT SURVEYS:  Patient-Specific Activity Scoring Scheme  0 represents unable to perform. 10 represents able to perform at prior level. 0 1 2 3 4 5 6 7 8 9  10 (Date and Score)   Activity Eval  06/21/24 09/14/24  1. Raising left arm  0 0  3  2. Putting on and off shirt 1  8 8   3.      4.     5.     Score 0.5/10 4/10 5.5/10   Total score = sum of the activity scores/number of activities Minimum detectable change (90%CI) for average score = 2 points Minimum detectable change (90%CI) for single activity score = 3 points     EDEMA:  Yes: moderate edema noted in left upper extremity from shoulder to hand  Observation:  2 incisions noted, one at proximal chest, one at axilla, both incisions closed with stitches  and appear closed without any drainage or signs of infection at the moment. Dressing was removed and changed for him at eval.     UPPER EXTREMITY ROM:  AROM/Passive ROM Left eval  Left 04/13/24 Left 05/24/24 Left 06/21/24 Left 08/16/24 Left 09/14/24  Shoulder flexion /50 /120 30/150 40/ 40/WFL 40/WFL  Shoulder extension        Shoulder abduction /50 /100 20/140 22/ 22/WFL 22/WFL  Shoulder adduction        Shoulder extension        Shoulder internal rotation /50    L2 behind back in standing / L1 behind back/  Shoulder external rotation /40 /70   To ear in standing/ To ear in standing/  Elbow flexion        Elbow extension        Wrist flexion        Wrist extension        Wrist ulnar deviation        Wrist radial deviation        Wrist pronation        Wrist supination         (Blank rows = not tested)   UPPER EXTREMITY MMT:  MMT Left eval Left 05/24/24 Left 06/21/24 Left 07/19/24 Left 08/16/24 Left 09/14/24  Shoulder flexion 1 2 2 2 2 2   Shoulder extension        Shoulder abduction 1 2 2 2 2 2   Shoulder adduction        Shoulder extension        Shoulder internal rotation 1 2 4 4 4  4+  Shoulder external rotation 1 2 3  3+ 3+ 4-  Middle trapezius        Lower trapezius        Elbow flexion 1 3 4 4 4 4   Elbow extension 2 3 4 4 4 4   Wrist flexion        Wrist extension        Wrist ulnar deviation        Wrist radial deviation        Wrist pronation        Wrist supination        Grip strength     3 3+   (Blank rows = not tested)                                                                                                                              TREATMENT DATE:   09/20/23 Theractivity (ROM and strength for reaching, push/pull, gripping) UBE L2 X 10 min , push/pull Wall ladder X 10 reps flexion, X 5 reps scaption Pulleys X 15 reps flexion with slow eccentrics Standing deltoid flexion isometric pushing into green ball 5 sec X 10, and abduction isometric  5 sec X 10 Seated bicep curls 3# X 15 reps Standing chest press green X 15 Standing IR red X 15 Standing shoulder adduction red X 15 Standing shoulder row red X 15 Standing shoulder extension red X 15 Standing ER red X 15 Supine shoulder flexion AAROM with cane X 10 with slow eccentrics Supine shoulder abduction AAROM  with cane X 10 with slow eccentrics Supine shoulder ER AAROM X 10 reps  09/15/23 Theractivity (ROM and strength for reaching, push/pull, gripping) UBE L2 X 10 min , push/pull Wall ladder X 10 reps flexion, X 5 reps scaption Pulleys X 15 reps flexion with slow eccentrics Standing deltoid flexion isometric pushing into green ball 5 sec X 10, and abduction isometric 5 sec X 10 Seated bicep curls 3# X 15 reps Standing chest press green X 15 Standing IR red X 15 Standing shoulder adduction red X 15 Standing shoulder row red X 15 Standing shoulder extension red X 15 Standing ER red X 15   09/13/23 Theractivity (ROM and strength for reaching, push/pull, gripping) UBE L2 X 10 min , push/pull Wall ladder X 10 reps flexion, X 5 reps scaption Pulleys X 15 reps flexion with slow eccentrics Standing deltoid flexion isometric pushing into green ball 5 sec X 10, and abduction isometric 5 sec X 10 Seated bicep curls 5# X 5 reps, then 3# X 10 reps Standing chest press green X 15 Standing IR green X 15 Standing shoulder adduction Green X 15 Standing shoulder row Green X 15 Standing shoulder extension Green X 15 Standing ER Green X 15 Supine shoulder flexion AAROM with cane X 10 with slow eccentrics Supine shoulder abduction AAROM with cane X 10 with slow eccentrics Supine shoulder ER AAROM X 10 reps   PATIENT EDUCATION: Education details: HEP, PT plan of care, selfcare Person educated: Patient Education method: Explanation, Demonstration, Verbal cues, and Handouts Education comprehension: verbalized understanding, further education recommended   HOME EXERCISE  PROGRAM: Access Code: [email or web address] URL: [email or web address] Date: 03/31/2024 Prepared by: Redell Moose  Exercises - Circular Shoulder Pendulum with Table Support  - 2-3 x daily - 7 x weekly - 1 sets - 15 reps - Seated Shoulder Abduction Towel Slide at Table Top  - 2-3 x daily - 6 x weekly - 1 sets - 10 reps - 5 sec hold - Seated Shoulder Flexion Towel Slide at Table Top  - 2-3 x daily - 6 x weekly - 1 sets - 10 reps - 5 sec hold - Seated Shoulder External Rotation PROM on Table  - 2-3 x daily - 6 x weekly - 1 sets - 10 reps - 5 sec hold - Seated Gripping Towel  - 2-3 x daily - 6 x weekly - 1 sets - 20 reps - Standing Elbow Flexion Extension AROM  - 2-3 x daily - 6 x weekly - 1 sets - 10 reps  ASSESSMENT: Pt continues to progress with exercises without adverse reaction.  OBJECTIVE IMPAIRMENTS: decreased activity tolerance for ADL's, difficulty walking, decreased balance, decreased endurance, decreased mobility, decreased ROM, decreased strength, impaired flexibility, impaired UE use, and pain.  ACTIVITY LIMITATIONS: bending, lifting, carry, reaching, locomotion, cleaning, community activity, driving,  PERSONAL FACTORS: see above PMH  also affecting patient's functional outcome.  REHAB POTENTIAL: Good  CLINICAL DECISION MAKING: Evolving/moderate complexity  EVALUATION COMPLEXITY: Moderate    GOALS: Short term PT Goals Target date: 04/28/2024   Pt will be I and compliant with HEP. Baseline:  Goal status: MET 04/18/24 Pt will decrease pain by 25% overall Baseline: Goal status: MET 04/18/24  Long term PT goals Target date:10/11/24  Pt will improve right shoulder and elbow AROM to [facility] to improve functional mobility Baseline: Goal status: ongoing 09/14/24 Pt will improve  right arm strength to at least 4+/5 MMT to improve functional strength Baseline: Goal status: ongoing 09/14/24 Pt will improve Patient  specific functional scale (PSFS) to at least 7/10 to show  improved function level Baseline: Goal status: ongoing 09/14/24 Pt will reduce pain to overall less than 3/10 with usual activity Baseline: Goal status: ongoing 09/14/24 PLAN: PT FREQUENCY: 1-3 times per week   PT DURATION: 6-8 weeks  PLANNED INTERVENTIONS (unless contraindicated):  97110-Therapeutic exercises, 97530- Therapeutic activity, 97112- Neuromuscular re-education, 97535- Self Care, [id number]- Manual therapy, [id number]- Electrical stimulation (unattended), [id number]- Vasopneumatic device, [id number]- Ultrasound, and 97033- Ionotophoresis 4mg /ml Dexamethasone   PLAN FOR NEXT SESSION: progress strength and function as tolerated, compensatory strategies for deltoid  Massie Ada, PT,DPT 09/19/2024, 8:48 AM   "

## 2024-09-21 ENCOUNTER — Ambulatory Visit

## 2024-09-21 DIAGNOSIS — M6281 Muscle weakness (generalized): Secondary | ICD-10-CM | POA: Diagnosis not present

## 2024-09-21 DIAGNOSIS — M25512 Pain in left shoulder: Secondary | ICD-10-CM

## 2024-09-21 NOTE — Therapy (Signed)
 " OUTPATIENT PHYSICAL THERAPY TREATMENT   See below for objective and subjective measurements relating to patients progress with PT.    Patient Name: Randy Hebert MRN: [id number] DOB:[birth year], 59 y.o., male Today's Date: 09/21/2024  END OF SESSION:  PT End of Session - 09/21/24 0815     Visit Number 22    Number of Visits 24    Date for Recertification  10/11/24    Progress Note Due on Visit 20    PT Start Time 0802    PT Stop Time 0842    PT Time Calculation (min) 40 min    Activity Tolerance Patient tolerated treatment well    Behavior During Therapy [facility] for tasks assessed/performed              Past Medical History:  Diagnosis Date   ACE inhibitor-aggravated angioedema    Allergy     Anemia    Angio-edema    Anxiety    Arthritis    Asthma    hip replacement   Back pain    Bradycardia 04/28/2017   Bulging of cervical intervertebral disc    CAD (coronary artery disease)    lateral STEMI 02/06/2015 00% D1 occlusion treated with Promus Premier 2.5 mm x 16 mm DES, 70% ramus stenosis, 40% mid RCA stenosis, 45% distal RCA stenosis, EF 45-50%   CHF (congestive heart failure) (HCC)    COPD (chronic obstructive pulmonary disease) (HCC)    Depression    Diabetes mellitus without complication (HCC)    Difficult intubation    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   Dry eye    Dyspnea    Early satiety 09/23/2016   Eczema of left external ear 09/17/2024   Fatty liver    GERD (gastroesophageal reflux disease)    HCAP (healthcare-associated pneumonia) 05/15/2017   Headache    Heart murmur    Hip pain    Hyperlipidemia    Hypertension    Lobar pneumonia 05/15/2017   Melena 08/04/2018   MI (myocardial infarction) [facility])    Myocardial infarction (HCC)    Neck pain    Non-ST elevation (NSTEMI) myocardial infarction (HCC) 04/27/2017   NSTEMI (non-ST elevated myocardial infarction) (HCC) 04/26/2017   Otitis media    Pleurisy    Pneumonia due to  COVID-19 virus 10/07/2019   Rectal bleeding 11/08/2018   Right shoulder pain 03/20/2020   Sinus pause    9 sec sinus pause on telemetry after started on coreg  after MI, avoid AV nodal blocking agent   Sleep apnea    uses a cpap   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   STEMI (ST elevation myocardial infarction) (HCC) 05/31/2015   Substance abuse (HCC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   Syncope 06/29/2017   Syncope and collapse 05/15/2017   Transaminitis 08/04/2018   Unilateral primary osteoarthritis, right hip 07/01/2016   Past Surgical History:  Procedure Laterality Date   ARTERY EXPLORATION Left 03/24/2024   Procedure: LEFT ARCH AORTOGRAM, LEFT UPPER EXTREMITY ANGIOGRAM WITH FIRST ORDER CANNULATION, LEFT AXILLARY ANGIOPLASTY AND STENTING, EVACUATION OF LEFT AXILLARY HEMATOMA;  Surgeon: Magda Debby SAILOR, MD;  Location: [facility] OR;  Service: Vascular;  Laterality: Left;   ARTERY REPAIR Left 03/11/2024   Procedure: REPAIR LEFT AXILLARY ARTERY;  Surgeon: Serene Gaile ORN, MD;  Location: [facility] OR;  Service: Vascular;  Laterality: Left;  ARTERIAL REPAIR, LEFT AXILLARY   BIOPSY  10/09/2016   Procedure: BIOPSY;  Surgeon: Lamar CHRISTELLA Hollingshead, MD;  Location:  [facility] ENDO SU[facility];  Service: Endoscopy;;   BIOPSY  11/28/2019   Procedure: BIOPSY;  Surgeon: Shaaron Lamar HERO, MD;  [facility]: [facility];  Service: Endoscopy;;   BIOPSY  06/15/2023   Procedure: BIOPSY;  Surgeon: Shaaron Lamar HERO, MD;  [facility]: [facility];  Service: Endoscopy;;   BRONCHIAL BIOPSY  10/16/2022   Procedure: BRONCHIAL BIOPSIES;  Surgeon: Gladis Leonor HERO, MD;  Location: [facility] ENDOSCOPY;  Service: Pulmonary;;   BRONCHIAL NEEDLE ASPIRATION BIOPSY  10/16/2022   Procedure: BRONCHIAL NEEDLE ASPIRATION BIOPSIES;  Surgeon: Gladis Leonor HERO, MD;  Location: [facility] ENDOSCOPY;  Service: Pulmonary;;   BRONCHIAL WASHINGS  10/16/2022   Procedure: BRONCHIAL WASHINGS;  Surgeon: Gladis Leonor HERO, MD;  Location: [facility] ENDOSCOPY;  Service: Pulmonary;;   CARDIAC  CATHETERIZATION N/A 02/06/2015   Procedure: Left Heart Cath and Coronary Angiography;  Surgeon: Alm LELON Clay, MD;  Location: [facility] INVASIVE CV LAB;  Service: Cardiovascular;  Laterality: N/A;   CARDIAC CATHETERIZATION N/A 02/06/2015   Procedure: Coronary Stent Intervention;  Surgeon: Alm LELON Clay, MD;  Location: [facility] INVASIVE CV LAB;  Service: Cardiovascular;  Laterality: N/A;   COLONOSCOPY N/A 09/07/2024   Procedure: COLONOSCOPY;  Surgeon: Shaaron Lamar HERO, MD;  [facility];  Service: Endoscopy;  Laterality: N/A;  11:30am, asa 3   COLONOSCOPY WITH PROPOFOL  N/A 10/09/2016   Sigmoid and descending colon diverticulosis, four 4-6 mm polyps in sigmoid, one 4 mm polyp in descending. Tubular adenomas and hyperplastic. 5 year surveillance.    COLONOSCOPY WITH PROPOFOL  N/A 11/24/2016   Sigmoid and descending colon diverticulosis, four 4-6 mm polyps in sigmoid, one 4 mm polyp in descending. Tubular adenomas and hyperplastic. 5 year surveillance.    COLONOSCOPY WITH PROPOFOL  N/A 10/18/2021   Carver: nonbleeding internal hemorrhoids, small and large mouth diverticula found in the sigmoid, descending, transverse colon.  A 9 mm sessile polyp was found in the ascending colon that was removed.  4 sessile polyps found in the sigmoid, descending, transverse colon 3 to 5 mm in size, examining otherwise.  Path revealed tubular adenomas.  Repeat due in 5 years for surveillance.   CORONARY ANGIOPLASTY WITH STENT PLACEMENT  01/2015   CORONARY STENT INTERVENTION N/A 04/27/2017   Procedure: CORONARY STENT INTERVENTION;  Surgeon: Mady Bruckner, MD;  [facility] INVASIVE CV LAB;  Service: Cardiovascular;  Laterality: N/A;   ELECTROPHYSIOLOGY STUDY N/A 06/29/2017   Procedure: ELECTROPHYSIOLOGY STUDY;  Surgeon: Waddell Danelle LELON, MD;  Location: [facility] INVASIVE CV LAB;  Service: Cardiovascular;  Laterality: N/A;   ESOPHAGOGASTRODUODENOSCOPY (EGD) WITH PROPOFOL  N/A 10/09/2016   Dr. Shaaron: LA grade a esophagitis.   Barrett's esophagus, biopsy-proven.  Small hiatal hernia.  EGD February 2019   ESOPHAGOGASTRODUODENOSCOPY (EGD) WITH PROPOFOL  N/A 11/28/2019    salmon-colored esophageal mucosa (Barrett's) small hiatal hernia, portal hypertensive gastropathy, normal duodenum, 3 year surveillance   ESOPHAGOGASTRODUODENOSCOPY (EGD) WITH PROPOFOL  N/A 09/06/2021   three columns of grade 1 varices in distal esophagus, no stigmata of bleeding or red wale signs. Small hiatal hernia. Mild portal gastropathy. Normal duodenum. Repeat upper endoscopy in 1 year for surveillance.   ESOPHAGOGASTRODUODENOSCOPY (EGD) WITH PROPOFOL  N/A 06/15/2023   Procedure: ESOPHAGOGASTRODUODENOSCOPY (EGD) WITH PROPOFOL ;  Surgeon: Shaaron Lamar HERO, MD;  [facility];  Service: Endoscopy;  Laterality: N/A;  830am, asa 3   FIDUCIAL MARKER PLACEMENT  10/16/2022   Procedure: FIDUCIAL MARKER PLACEMENT;  Surgeon: Gladis Leonor HERO, MD;  Location: [facility] ENDOSCOPY;  Service: Pulmonary;;   INCISION / DRAINAGE HAND / FINGER  LEFT HEART CATH AND CORONARY ANGIOGRAPHY N/A 04/27/2017   Procedure: LEFT HEART CATH AND CORONARY ANGIOGRAPHY;  Surgeon: Mady Bruckner, MD;  [facility]: [facility] INVASIVE CV LAB;  Service: Cardiovascular;  Laterality: N/A;   LOOP RECORDER INSERTION  06/29/2017   Procedure: Loop Recorder Insertion;  Surgeon: Waddell Danelle ORN, MD;  Location: [facility] INVASIVE CV LAB;  Service: Cardiovascular;;   POLYPECTOMY  11/24/2016   Procedure: POLYPECTOMY;  Surgeon: Lamar CHRISTELLA Hollingshead, MD;  [facility];  Service: Endoscopy;;  descending and sigmoid   POLYPECTOMY  10/18/2021   Procedure: POLYPECTOMY;  Surgeon: Cindie Carlin POUR, DO;  [facility];  Service: Endoscopy;;   TOTAL HIP ARTHROPLASTY Right 07/01/2016   TOTAL HIP ARTHROPLASTY Right 07/01/2016   Procedure: RIGHT TOTAL HIP ARTHROPLASTY ANTERIOR APPROACH;  Surgeon: Bruckner CINDERELLA Poli, MD;  Location: [facility] OR;  Service: Orthopedics;  Laterality: Right;   ULTRASOUND GUIDANCE  FOR VASCULAR ACCESS Left 03/24/2024   Procedure: ULTRASOUND GUIDANCE, FOR VASCULAR ACCESS;  Surgeon: Magda Debby SAILOR, MD;  Location: [facility] OR;  Service: Vascular;  Laterality: Left;   Patient Active Problem List   Diagnosis Date Noted   Cauliflower left ear 09/17/2024   Eczema of left external ear 09/17/2024   Mild intermittent asthma without complication 09/17/2024   Pain of left upper extremity 04/24/2024   Acute postoperative anemia due to greater than expected blood loss 03/26/2024   Obesity, class 1 03/26/2024   Aneurysm of axillary artery 03/24/2024   Axillary artery injury 03/12/2024   Acute CVA (cerebrovascular accident) (HCC) 03/02/2024   Essential hypertension 03/02/2024   Dyslipidemia 03/02/2024   Type 2 diabetes mellitus with hyperlipidemia (HCC) 03/02/2024   Erectile dysfunction 02/24/2023   Chronic eczematous otitis externa of both ears 02/05/2023   Tobacco abuse 12/25/2022   Malignant neoplasm of upper lobe of right lung (HCC) 09/25/2022   Cervical spondylosis with myelopathy 08/01/2022   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   DDD (degenerative disc disease), cervical 05/21/2022   Osteoarthritis of both hands 05/19/2022   History of GI bleed 11/13/2021   Insomnia 09/13/2021   Cirrhosis of liver (HCC) 11/08/2018   Barrett's esophagus 08/04/2018   Vitamin D  deficiency 06/30/2018   Alcohol  abuse 06/30/2018   DM type 2 causing vascular disease (HCC) 08/17/2017   Chronic combined systolic and diastolic CHF (congestive heart failure) (HCC) 05/15/2017   OSA (obstructive sleep apnea) 04/28/2017   Depression with anxiety 04/15/2017   GERD (gastroesophageal reflux disease) 09/23/2016   Asthmatic bronchitis , chronic (HCC) 08/27/2015   Mixed hyperlipidemia 08/27/2015   Class 1 obesity due to excess calories with serious comorbidity and body mass index (BMI) of 32.0 to 32.9 in adult 08/27/2015   CAD (coronary artery disease) 05/31/2015   Essential hypertension, benign 02/09/2015     PCP: Cook, Jayce G, DO   REFERRING PROVIDER: Noralee Elidia Sieving*   REFERRING DIAG: left axillary pseudoaneurysm   Rationale for Evaluation and Treatment:  Rehabiliation  THERAPY DIAG:  Muscle weakness (generalized)  Acute pain of left shoulder  ONSET DATE: 03/11/24   SUBJECTIVE:  SUBJECTIVE STATEMENT: Pt reports doing well today reporting increased muscle tightness around the L posterior lateral arm.  PERTINENT HISTORY:  past medical history of diabetes mellitus type 2, hypertension, CAD, liver cirrhosis, anxiety, depression, OSA, CVA w/ left-sided weakness who had a recent hospitalization 7/11 to 03/19/24 post traumatic left shoulder dislocation, complicated with left axillary hematoma with left axillary pseudoaneurysm. On 07/12 he underwent open exposure and repair of the left axillary pseudoaneurysm and evacuation of left axillary hematoma.   PAIN:  NPRS scale: 1/10 Pain location:left shoulder/axillary, left forearm and wrist Pain description: constant, achy, sharp Aggravating factors: any use of this left arm. Relieving factors: rest, meds   PRECAUTIONS: ,  Shoulder  RED FLAGS: None   WEIGHT BEARING RESTRICTIONS:  Not anymore  FALLS:  Has patient fallen in last 6 months? Yes. Number of falls 1   OCCUPATION:  disabled  PLOF:  Independent with basic ADLs  PATIENT GOALS:  Reduce pain and get his arm back to normal  OBJECTIVE:  Note: Objective measures were completed at Evaluation unless otherwise noted.  DIAGNOSTIC FINDINGS:  Chest CT IMPRESSION: 1. Enlarging left axillary hematoma with arterial contrast extravasation from the posterior wall of the left axillary artery into a 1.5 x 1.8 cm pseudoaneurysm, with additional 1.8 x 1.1 cm pocket of contrast more  posteriorly within the collection. 2. Increased heterogeneous hematoma in the left subscapularis region measuring 7.3 x 4.8 cm, but no focal contrast is seen within this. 3. Emphysema. 4. Right apical fiducial markers and post XRT changes, with treated nodule just above the fiducial markers today measuring 8 mm, previously 9 mm. 5. Aortic and coronary artery atherosclerosis.  Right aortic arch. 6. Enlarged pulmonary trunk 3.3 cm indicating arterial hypertension. 7. Cirrhosis and splenomegaly.  Prominent patent portal vein 1.7 cm. 8. Critical Value/emergent results were called by telephone at the time of interpretation on 03/24/2024 at 5:18 am to provider [facility] , who verbally acknowledged these results.  Shoulder XR IMPRESSION: No acute bony abnormality.   Postoperative changes in the left axilla.  No imaging for left elbow/wrist  06/03/24 EMG & NCS Examination  Impression: 1. This is an abnormal electrodiagnostic study of the left upper extremity 2. There is electrodiagnostic evidence of a diffuse, subacute brachial plexopathy affecting the upper, middle, and lower trunks of the left upper extremity with widespread denervation changes indicating ongoing axonal injury   PATIENT SURVEYS:  Patient-Specific Activity Scoring Scheme  0 represents unable to perform. 10 represents able to perform at prior level. 0 1 2 3 4 5 6 7 8 9  10 (Date and Score)   Activity Eval  06/21/24 09/14/24  1. Raising left arm  0 0  3  2. Putting on and off shirt 1  8 8   3.      4.     5.     Score 0.5/10 4/10 5.5/10   Total score = sum of the activity scores/number of activities Minimum detectable change (90%CI) for average score = 2 points Minimum detectable change (90%CI) for single activity score = 3 points     EDEMA:  Yes: moderate edema noted in left upper extremity from shoulder to hand  Observation:  2 incisions noted, one at proximal chest, one at axilla, both incisions  closed with stitches and appear closed without any drainage or signs of infection at the moment. Dressing was removed and changed for him at eval.     UPPER EXTREMITY ROM:  AROM/Passive ROM Left eval Left  04/13/24 Left 05/24/24 Left 06/21/24 Left 08/16/24 Left 09/14/24  Shoulder flexion /50 /120 30/150 40/ 40/WFL 40/WFL  Shoulder extension        Shoulder abduction /50 /100 20/140 22/ 22/WFL 22/WFL  Shoulder adduction        Shoulder extension        Shoulder internal rotation /50    L2 behind back in standing / L1 behind back/  Shoulder external rotation /40 /70   To ear in standing/ To ear in standing/  Elbow flexion        Elbow extension        Wrist flexion        Wrist extension        Wrist ulnar deviation        Wrist radial deviation        Wrist pronation        Wrist supination         (Blank rows = not tested)   UPPER EXTREMITY MMT:  MMT Left eval Left 05/24/24 Left 06/21/24 Left 07/19/24 Left 08/16/24 Left 09/14/24  Shoulder flexion 1 2 2 2 2 2   Shoulder extension        Shoulder abduction 1 2 2 2 2 2   Shoulder adduction        Shoulder extension        Shoulder internal rotation 1 2 4 4 4  4+  Shoulder external rotation 1 2 3  3+ 3+ 4-  Middle trapezius        Lower trapezius        Elbow flexion 1 3 4 4 4 4   Elbow extension 2 3 4 4 4 4   Wrist flexion        Wrist extension        Wrist ulnar deviation        Wrist radial deviation        Wrist pronation        Wrist supination        Grip strength     3 3+   (Blank rows = not tested)                                                                                                                              TREATMENT DATE:   09/22/23 Theractivity (ROM and strength for reaching, push/pull, gripping) UBE L2.5 X 10 min , push/pull Pulleys X 15 reps flexion with slow eccentrics Seated bicep curls 3# X 20 reps Standing deltoid flexion isometric pushing into green ball 5 sec X 10, and abduction  isometric 5 sec X 10 Standing chest press green X 15 Standing IR red X 15 Standing shoulder adduction red X 15 Standing shoulder row red X 15 Standing shoulder extension red X 15 Standing ER red X 15 UE Ranger 1 x 8 reps flexion and IR/ER 1 x 5 reps ea Blaze pods x 3 trials for random reach  09/20/23 Theractivity (ROM  and strength for reaching, push/pull, gripping) UBE L2 X 10 min , push/pull Wall ladder X 10 reps flexion, X 5 reps scaption Pulleys X 15 reps flexion with slow eccentrics Standing deltoid flexion isometric pushing into green ball 5 sec X 10, and abduction isometric 5 sec X 10 Seated bicep curls 3# X 15 reps Standing chest press green X 15 Standing IR red X 15 Standing shoulder adduction red X 15 Standing shoulder row red X 15 Standing shoulder extension red X 15 Standing ER red X 15 Supine shoulder flexion AAROM with cane X 10 with slow eccentrics Supine shoulder abduction AAROM with cane X 10 with slow eccentrics Supine shoulder ER AAROM X 10 reps  09/15/23 Theractivity (ROM and strength for reaching, push/pull, gripping) UBE L2 X 10 min , push/pull Wall ladder X 10 reps flexion, X 5 reps scaption Pulleys X 15 reps flexion with slow eccentrics Standing deltoid flexion isometric pushing into green ball 5 sec X 10, and abduction isometric 5 sec X 10 Seated bicep curls 3# X 15 reps Standing chest press green X 15 Standing IR red X 15 Standing shoulder adduction red X 15 Standing shoulder row red X 15 Standing shoulder extension red X 15 Standing ER red X 15   09/13/23 Theractivity (ROM and strength for reaching, push/pull, gripping) UBE L2 X 10 min , push/pull Wall ladder X 10 reps flexion, X 5 reps scaption Pulleys X 15 reps flexion with slow eccentrics Standing deltoid flexion isometric pushing into green ball 5 sec X 10, and abduction isometric 5 sec X 10 Seated bicep curls 5# X 5 reps, then 3# X 10 reps Standing chest press green X 15 Standing IR green  X 15 Standing shoulder adduction Green X 15 Standing shoulder row Green X 15 Standing shoulder extension Green X 15 Standing ER Green X 15 Supine shoulder flexion AAROM with cane X 10 with slow eccentrics Supine shoulder abduction AAROM with cane X 10 with slow eccentrics Supine shoulder ER AAROM X 10 reps   PATIENT EDUCATION: Education details: HEP, PT plan of care, selfcare Person educated: Patient Education method: Explanation, Demonstration, Verbal cues, and Handouts Education comprehension: verbalized understanding, further education recommended   HOME EXERCISE PROGRAM: Access Code: [email or web address] URL: [email or web address] Date: 03/31/2024 Prepared by: Redell Moose  Exercises - Circular Shoulder Pendulum with Table Support  - 2-3 x daily - 7 x weekly - 1 sets - 15 reps - Seated Shoulder Abduction Towel Slide at Table Top  - 2-3 x daily - 6 x weekly - 1 sets - 10 reps - 5 sec hold - Seated Shoulder Flexion Towel Slide at Table Top  - 2-3 x daily - 6 x weekly - 1 sets - 10 reps - 5 sec hold - Seated Shoulder External Rotation PROM on Table  - 2-3 x daily - 6 x weekly - 1 sets - 10 reps - 5 sec hold - Seated Gripping Towel  - 2-3 x daily - 6 x weekly - 1 sets - 20 reps - Standing Elbow Flexion Extension AROM  - 2-3 x daily - 6 x weekly - 1 sets - 10 reps  ASSESSMENT: Pt demonstrated increased L shoulder flexion strength as evidenced by improved performance with UE Ranger; however, continued therapy is needed to address eccentric control.  OBJECTIVE IMPAIRMENTS: decreased activity tolerance for ADL's, difficulty walking, decreased balance, decreased endurance, decreased mobility, decreased ROM, decreased strength, impaired flexibility, impaired UE use, and pain.  ACTIVITY LIMITATIONS: bending, lifting, carry, reaching, locomotion,  cleaning, community activity, driving,  PERSONAL FACTORS: see above PMH  also affecting patient's functional outcome.  REHAB POTENTIAL:  Good  CLINICAL DECISION MAKING: Evolving/moderate complexity  EVALUATION COMPLEXITY: Moderate    GOALS: Short term PT Goals Target date: 04/28/2024   Pt will be I and compliant with HEP. Baseline:  Goal status: MET 04/18/24 Pt will decrease pain by 25% overall Baseline: Goal status: MET 04/18/24  Long term PT goals Target date:10/11/24  Pt will improve right shoulder and elbow AROM to [facility] to improve functional mobility Baseline: Goal status: ongoing 09/14/24 Pt will improve  right arm strength to at least 4+/5 MMT to improve functional strength Baseline: Goal status: ongoing 09/14/24 Pt will improve Patient specific functional scale (PSFS) to at least 7/10 to show improved function level Baseline: Goal status: ongoing 09/14/24 Pt will reduce pain to overall less than 3/10 with usual activity Baseline: Goal status: ongoing 09/14/24 PLAN: PT FREQUENCY: 1-3 times per week   PT DURATION: 6-8 weeks  PLANNED INTERVENTIONS (unless contraindicated):  97110-Therapeutic exercises, 97530- Therapeutic activity, 97112- Neuromuscular re-education, 97535- Self Care, 02859- Manual therapy, [id number]- Electrical stimulation (unattended), 97016- Vasopneumatic device, [id number]- Ultrasound, and 97033- Ionotophoresis 4mg /ml Dexamethasone   PLAN FOR NEXT SESSION: progress strength and function as tolerated, compensatory strategies for deltoid  Massie Ada, PT,DPT 09/21/2024, 8:51 AM   "

## 2024-09-28 ENCOUNTER — Telehealth: Payer: Self-pay

## 2024-09-28 ENCOUNTER — Ambulatory Visit: Admitting: Physical Therapy

## 2024-09-28 ENCOUNTER — Other Ambulatory Visit: Payer: Self-pay | Admitting: Family Medicine

## 2024-09-28 MED ORDER — DOXYCYCLINE HYCLATE 100 MG PO TABS: 100.0000 mg | ORAL_TABLET | Freq: Two times a day (BID) | ORAL | 0 refills | Status: AC

## 2024-09-28 NOTE — Telephone Encounter (Signed)
 Copied from CRM #[id number]. Topic: Clinical - Medication Question >> Sep 28, 2024  9:24 AM Carlyon D wrote: Reason for CRM: PT is calling in regards to possible Sinus infection, as he has been sneezing, runny nose, PT is asking if he can get a Zpack sent over to the pharmacy as this is what he has had previously he is asking if this medication can prescribed again. Pt states he is also taking allegra for allergies. Please call pt if any further questions or issues.   Preferred pharmacy: [facility] [place], [place] - [address] [place] [address] Phone: [phone] Fax: [phone] Hours: Not open 24 hours

## 2024-09-29 NOTE — Telephone Encounter (Signed)
 Attempted to call patient and left voicemail to give us  a call back regarding a new antibiotic

## 2024-09-30 NOTE — Telephone Encounter (Signed)
 Patient knows about his new medication to pick up

## 2024-10-03 NOTE — H&P (Signed)
 Surgical History & Physical  Patient Name: Randy Hebert  DOB: [birth year]  Surgery: Cataract extraction with intraocular lens implant phacoemulsification; Left Eye Surgeon: Lynwood Hermann MD Surgery Date: 10/07/2024 Pre-Op Date: 09/29/2024  HPI: A 50 Yr. old male patient 1.  The patient is here for a cataract eval. Pt. had shoulder surgery which postponed having it done previously. Pt. complains of difficulty when driving due to glare from headlights or sun, and blurred vision. Both eyes are affected. The episode is constant. Symptoms occur when the patient is driving and reading. The complaint is associated with blurry vision. This is negatively affecting the patient's quality of life and the patient is unable to function adequately in life with the current level of vision. Pt. is not wearing glasses due to being broke. Pt. states blood sugar has been stable. HPI Completed by Dr. Lynwood Hermann  Medical History: Cataracts Arthritis Cancer Diabetes Heart Problem High Blood Pressure LDL Lung Problems Stroke vascular disease  Review of Systems Cardiovascular High Blood Pressure, heart attack x3 Endocrine diabetes Musculoskeletal arthritis Neurological Stroke Respiratory lung cancer All recorded systems are negative except as noted above.  Social Smokes tobacco daily    Medication Cephalexin , Citalopram , Atorvastatin , Glipizide , Naproxen , Symbicort , Citalopram , Pantoprazole , Farxiga , Trulicity , Amlodipine , Fluticasone  propionate, Clopidogrel , Promethazine -DM, Azithromycin , Atorvastatin , Triamcinolone  acetonide, Doxycycline  hyclate, GaviLyte-G  Sx/Procedures Shoulder surgery  Drug Allergies  Chantix , Amoxicillin , Lisinopril   History & Physical: Heent: cataracts NECK: supple without bruits LUNGS: lungs clear to auscultation CV: regular rate and rhythm Abdomen: soft and non-tender  Impression & Plan: Assessment: 1.  COMBINED FORMS AGE RELATED CATARACT; Both Eyes  (H25.813) 2.  BLEPHARITIS; Right Upper Lid, Right Lower Lid, Left Upper Lid, Left Lower Lid (H01.001, H01.002,H01.004,H01.005) 3.  DERMATOCHALASIS, no surgery; Right Upper Lid, Left Upper Lid (H02.831, H02.834) 4.  Pinguecula; Both Eyes (H11.153) 5.  ASTIGMATISM, REGULAR; Both Eyes (H52.223)  Plan: 1.  Cataract accounts for the patient's decreased vision. This visual impairment is not correctable with a tolerable change in glasses or contact lenses. Cataract surgery with an implantation of a new lens should significantly improve the visual and functional status of the patient. Discussed all risks, benefits, alternatives, and potential complications. Discussed the procedures and recovery. Patient desires to have surgery. A-scan ordered and performed today for intra-ocular lens calculations. The surgery will be performed in order to improve vision for driving, reading, and for eye examinations. Recommend phacoemulsification with intra-ocular lens. Recommend Dextenza  for post-operative pain and inflammation. History of corneal refractive Surgery: History of Previous Ocular Surgery (PPV, other): None History of ocular trauma: None Use of Eye Pressure Lowering Drops: None No current contact lens use. Pupil Status: Dilates well - shugarcaine or Lidocaine +Omidira by protocol Left Eye worse. OS first, then OD. Recommend Toric Lens. Pt already has some drops from patient's last surgery date that was scheduled.  2.  Blepharitis is present - recommend regular lid cleaning.  3.  Asymptomatic, recommend observation for now. Findings, prognosis and treatment options reviewed.  4.  Observe; Artificial tears as needed for irritation.  5.  Recommend Toric IOL OU.

## 2024-10-05 ENCOUNTER — Encounter (HOSPITAL_COMMUNITY): Payer: Self-pay

## 2024-10-05 ENCOUNTER — Other Ambulatory Visit: Payer: Self-pay

## 2024-10-05 ENCOUNTER — Ambulatory Visit: Attending: Family Medicine | Admitting: Physical Therapy

## 2024-10-05 ENCOUNTER — Encounter (HOSPITAL_COMMUNITY): Admission: RE | Admit: 2024-10-05 | Discharge: 2024-10-05 | Attending: Ophthalmology

## 2024-10-05 ENCOUNTER — Encounter: Payer: Self-pay | Admitting: Physical Therapy

## 2024-10-05 DIAGNOSIS — M6281 Muscle weakness (generalized): Secondary | ICD-10-CM

## 2024-10-05 DIAGNOSIS — M25512 Pain in left shoulder: Secondary | ICD-10-CM

## 2024-10-05 NOTE — Therapy (Signed)
 " OUTPATIENT PHYSICAL THERAPY TREATMENT/Recert    Patient Name: Randy Hebert MRN: [id number] DOB:05/15/1966, 59 y.o., male Today's Date: 10/05/2024  END OF SESSION:  PT End of Session - 10/05/24 0900     Visit Number 23    Number of Visits 26    Date for Recertification  11/02/24    Progress Note Due on Visit 20    PT Start Time 0840    PT Stop Time 0920    PT Time Calculation (min) 40 min    Activity Tolerance Patient tolerated treatment well    Behavior During Therapy [facility] for tasks assessed/performed              Past Medical History:  Diagnosis Date   ACE inhibitor-aggravated angioedema    Allergy     Anemia    Angio-edema    Anxiety    Arthritis    Asthma    hip replacement   Back pain    Bradycardia 04/28/2017   Bulging of cervical intervertebral disc    CAD (coronary artery disease)    lateral STEMI 02/06/2015 00% D1 occlusion treated with Promus Premier 2.5 mm x 16 mm DES, 70% ramus stenosis, 40% mid RCA stenosis, 45% distal RCA stenosis, EF 45-50%   CHF (congestive heart failure) (HCC)    COPD (chronic obstructive pulmonary disease) (HCC)    Depression    Diabetes mellitus without complication (HCC)    Difficult intubation    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   Dry eye    Dyspnea    Early satiety 09/23/2016   Eczema of left external ear 09/17/2024   Fatty liver    GERD (gastroesophageal reflux disease)    HCAP (healthcare-associated pneumonia) 05/15/2017   Headache    Heart murmur    Hip pain    Hyperlipidemia    Hypertension    Lobar pneumonia 05/15/2017   Melena 08/04/2018   MI (myocardial infarction) (HCC)    Myocardial infarction (HCC)    Neck pain    Non-ST elevation (NSTEMI) myocardial infarction (HCC) 04/27/2017   NSTEMI (non-ST elevated myocardial infarction) (HCC) 04/26/2017   Otitis media    Pleurisy    Pneumonia due to COVID-19 virus 10/07/2019   Rectal bleeding 11/08/2018   Right shoulder pain 03/20/2020    Sinus pause    9 sec sinus pause on telemetry after started on coreg  after MI, avoid AV nodal blocking agent   Sleep apnea    uses a cpap   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   STEMI (ST elevation myocardial infarction) (HCC) 05/31/2015   Substance abuse (HCC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   Syncope 06/29/2017   Syncope and collapse 05/15/2017   Transaminitis 08/04/2018   Unilateral primary osteoarthritis, right hip 07/01/2016   Past Surgical History:  Procedure Laterality Date   ARTERY EXPLORATION Left 03/24/2024   Procedure: LEFT ARCH AORTOGRAM, LEFT UPPER EXTREMITY ANGIOGRAM WITH FIRST ORDER CANNULATION, LEFT AXILLARY ANGIOPLASTY AND STENTING, EVACUATION OF LEFT AXILLARY HEMATOMA;  Surgeon: Magda Debby SAILOR, MD;  Location: [facility] OR;  Service: Vascular;  Laterality: Left;   ARTERY REPAIR Left 03/11/2024   Procedure: REPAIR LEFT AXILLARY ARTERY;  Surgeon: Serene Gaile ORN, MD;  Location: [facility] OR;  Service: Vascular;  Laterality: Left;  ARTERIAL REPAIR, LEFT AXILLARY   BIOPSY  10/09/2016   Procedure: BIOPSY;  Surgeon: Lamar CHRISTELLA Hollingshead, MD;  Location: [facility] END[facility] SU[facility];  Service: Endoscopy;;   BIOPSY  11/28/2019   Procedure: BIOPSY;  Surgeon: Shaaron Lamar HERO, MD;  Location: [facility];  Service: Endoscopy;;   BIOPSY  06/15/2023   Procedure: BIOPSY;  Surgeon: Shaaron Lamar HERO, MD;  Location: [facility];  Service: Endoscopy;;   BRONCHIAL BIOPSY  10/16/2022   Procedure: BRONCHIAL BIOPSIES;  Surgeon: Gladis Leonor HERO, MD;  Location: [facility] ENDOSCOPY;  Service: Pulmonary;;   BRONCHIAL NEEDLE ASPIRATION BIOPSY  10/16/2022   Procedure: BRONCHIAL NEEDLE ASPIRATION BIOPSIES;  Surgeon: Gladis Leonor HERO, MD;  Location: [facility] ENDOSCOPY;  Service: Pulmonary;;   BRONCHIAL WASHINGS  10/16/2022   Procedure: BRONCHIAL WASHINGS;  Surgeon: Gladis Leonor HERO, MD;  Location: [facility] ENDOSCOPY;  Service: Pulmonary;;   CARDIAC CATHETERIZATION N/A 02/06/2015   Procedure: Left Heart Cath and Coronary Angiography;  Surgeon:  Alm LELON Clay, MD;  Location: [facility] INVASIVE CV LAB;  Service: Cardiovascular;  Laterality: N/A;   CARDIAC CATHETERIZATION N/A 02/06/2015   Procedure: Coronary Stent Intervention;  Surgeon: Alm LELON Clay, MD;  Location: [facility] INVASIVE CV LAB;  Service: Cardiovascular;  Laterality: N/A;   COLONOSCOPY N/A 09/07/2024   Procedure: COLONOSCOPY;  Surgeon: Shaaron Lamar HERO, MD;  [facility]: [facility];  Service: Endoscopy;  Laterality: N/A;  11:30am, asa 3   COLONOSCOPY WITH PROPOFOL  N/A 10/09/2016   Sigmoid and descending colon diverticulosis, four 4-6 mm polyps in sigmoid, one 4 mm polyp in descending. Tubular adenomas and hyperplastic. 5 year surveillance.    COLONOSCOPY WITH PROPOFOL  N/A 11/24/2016   Sigmoid and descending colon diverticulosis, four 4-6 mm polyps in sigmoid, one 4 mm polyp in descending. Tubular adenomas and hyperplastic. 5 year surveillance.    COLONOSCOPY WITH PROPOFOL  N/A 10/18/2021   Carver: nonbleeding internal hemorrhoids, small and large mouth diverticula found in the sigmoid, descending, transverse colon.  A 9 mm sessile polyp was found in the ascending colon that was removed.  4 sessile polyps found in the sigmoid, descending, transverse colon 3 to 5 mm in size, examining otherwise.  Path revealed tubular adenomas.  Repeat due in 5 years for surveillance.   CORONARY ANGIOPLASTY WITH STENT PLACEMENT  01/2015   CORONARY STENT INTERVENTION N/A 04/27/2017   Procedure: CORONARY STENT INTERVENTION;  Surgeon: Mady Bruckner, MD;  Location: [facility] INVASIVE CV LAB;  Service: Cardiovascular;  Laterality: N/A;   ELECTROPHYSIOLOGY STUDY N/A 06/29/2017   Procedure: ELECTROPHYSIOLOGY STUDY;  Surgeon: Waddell Danelle LELON, MD;  Location: [facility] INVASIVE CV LAB;  Service: Cardiovascular;  Laterality: N/A;   ESOPHAGOGASTRODUODENOSCOPY (EGD) WITH PROPOFOL  N/A 10/09/2016   Dr. Shaaron: LA grade a esophagitis.  Barrett's esophagus, biopsy-proven.  Small hiatal hernia.  EGD February 2019    ESOPHAGOGASTRODUODENOSCOPY (EGD) WITH PROPOFOL  N/A 11/28/2019    salmon-colored esophageal mucosa (Barrett's) small hiatal hernia, portal hypertensive gastropathy, normal duodenum, 3 year surveillance   ESOPHAGOGASTRODUODENOSCOPY (EGD) WITH PROPOFOL  N/A 09/06/2021   three columns of grade 1 varices in distal esophagus, no stigmata of bleeding or red wale signs. Small hiatal hernia. Mild portal gastropathy. Normal duodenum. Repeat upper endoscopy in 1 year for surveillance.   ESOPHAGOGASTRODUODENOSCOPY (EGD) WITH PROPOFOL  N/A 06/15/2023   Procedure: ESOPHAGOGASTRODUODENOSCOPY (EGD) WITH PROPOFOL ;  Surgeon: Shaaron Lamar HERO, MD;  [facility];  Service: Endoscopy;  Laterality: N/A;  830am, asa 3   FIDUCIAL MARKER PLACEMENT  10/16/2022   Procedure: FIDUCIAL MARKER PLACEMENT;  Surgeon: Gladis Leonor HERO, MD;  Location: [facility] ENDOSCOPY;  Service: Pulmonary;;   INCISION / DRAINAGE HAND / FINGER     LEFT HEART CATH AND CORONARY ANGIOGRAPHY N/A 04/27/2017   Procedure: LEFT HEART CATH  AND CORONARY ANGIOGRAPHY;  Surgeon: Mady Bruckner, MD;  Location: [facility] INVASIVE CV LAB;  Service: Cardiovascular;  Laterality: N/A;   LOOP RECORDER INSERTION  06/29/2017   Procedure: Loop Recorder Insertion;  Surgeon: Waddell Danelle ORN, MD;  Location: [facility] INVASIVE CV LAB;  Service: Cardiovascular;;   POLYPECTOMY  11/24/2016   Procedure: POLYPECTOMY;  Surgeon: Lamar CHRISTELLA Hollingshead, MD;  [facility];  Service: Endoscopy;;  descending and sigmoid   POLYPECTOMY  10/18/2021   Procedure: POLYPECTOMY;  Surgeon: Cindie Carlin POUR, DO;  Location: [facility];  Service: Endoscopy;;   TOTAL HIP ARTHROPLASTY Right 07/01/2016   TOTAL HIP ARTHROPLASTY Right 07/01/2016   Procedure: RIGHT TOTAL HIP ARTHROPLASTY ANTERIOR APPROACH;  Surgeon: Bruckner CINDERELLA Poli, MD;  Location: [facility] OR;  Service: Orthopedics;  Laterality: Right;   ULTRASOUND GUIDANCE FOR VASCULAR ACCESS Left 03/24/2024   Procedure: ULTRASOUND GUIDANCE, FOR  VASCULAR ACCESS;  Surgeon: Magda Debby SAILOR, MD;  Location: [facility] OR;  Service: Vascular;  Laterality: Left;   Patient Active Problem List   Diagnosis Date Noted   Cauliflower left ear 09/17/2024   Eczema of left external ear 09/17/2024   Mild intermittent asthma without complication 09/17/2024   Pain of left upper extremity 04/24/2024   Acute postoperative anemia due to greater than expected blood loss 03/26/2024   Obesity, class 1 03/26/2024   Aneurysm of axillary artery 03/24/2024   Axillary artery injury 03/12/2024   Acute CVA (cerebrovascular accident) (HCC) 03/02/2024   Essential hypertension 03/02/2024   Dyslipidemia 03/02/2024   Type 2 diabetes mellitus with hyperlipidemia (HCC) 03/02/2024   Erectile dysfunction 02/24/2023   Chronic eczematous otitis externa of both ears 02/05/2023   Tobacco abuse 12/25/2022   Malignant neoplasm of upper lobe of right lung (HCC) 09/25/2022   Cervical spondylosis with myelopathy 08/01/2022   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   DDD (degenerative disc disease), cervical 05/21/2022   Osteoarthritis of both hands 05/19/2022   History of GI bleed 11/13/2021   Insomnia 09/13/2021   Cirrhosis of liver (HCC) 11/08/2018   Barrett's esophagus 08/04/2018   Vitamin D  deficiency 06/30/2018   Alcohol  abuse 06/30/2018   DM type 2 causing vascular disease (HCC) 08/17/2017   Chronic combined systolic and diastolic CHF (congestive heart failure) (HCC) 05/15/2017   OSA (obstructive sleep apnea) 04/28/2017   Depression with anxiety 04/15/2017   GERD (gastroesophageal reflux disease) 09/23/2016   Asthmatic bronchitis , chronic (HCC) 08/27/2015   Mixed hyperlipidemia 08/27/2015   Class 1 obesity due to excess calories with serious comorbidity and body mass index (BMI) of 32.0 to 32.9 in adult 08/27/2015   CAD (coronary artery disease) 05/31/2015   Essential hypertension, benign 02/09/2015    PCP: Cook, Jayce G, DO   REFERRING PROVIDER: Noralee Elidia Sieving*   REFERRING DIAG: left axillary pseudoaneurysm   Rationale for Evaluation and Treatment:  Rehabiliation  THERAPY DIAG:  Muscle weakness (generalized)  Acute pain of left shoulder  ONSET DATE: 03/11/24   SUBJECTIVE:  SUBJECTIVE STATEMENT: Pt reports arm is sore from raking snow. He would like to finish up this current month of February with PT and then discharge.   PERTINENT HISTORY:  past medical history of diabetes mellitus type 2, hypertension, CAD, liver cirrhosis, anxiety, depression, OSA, CVA w/ left-sided weakness who had a recent hospitalization 7/11 to 03/19/24 post traumatic left shoulder dislocation, complicated with left axillary hematoma with left axillary pseudoaneurysm. On 07/12 he underwent open exposure and repair of the left axillary pseudoaneurysm and evacuation of left axillary hematoma.   PAIN:  NPRS scale: 1/10 Pain location:left shoulder/axillary, left forearm and wrist Pain description: constant, achy, sharp Aggravating factors: any use of this left arm. Relieving factors: rest, meds   PRECAUTIONS: ,  Shoulder  RED FLAGS: None   WEIGHT BEARING RESTRICTIONS:  Not anymore  FALLS:  Has patient fallen in last 6 months? Yes. Number of falls 1   OCCUPATION:  disabled  PLOF:  Independent with basic ADLs  PATIENT GOALS:  Reduce pain and get his arm back to normal  OBJECTIVE:  Note: Objective measures were completed at Evaluation unless otherwise noted.  DIAGNOSTIC FINDINGS:  Chest CT IMPRESSION: 1. Enlarging left axillary hematoma with arterial contrast extravasation from the posterior wall of the left axillary artery into a 1.5 x 1.8 cm pseudoaneurysm, with additional 1.8 x 1.1 cm pocket of contrast more posteriorly within the collection. 2.  Increased heterogeneous hematoma in the left subscapularis region measuring 7.3 x 4.8 cm, but no focal contrast is seen within this. 3. Emphysema. 4. Right apical fiducial markers and post XRT changes, with treated nodule just above the fiducial markers today measuring 8 mm, previously 9 mm. 5. Aortic and coronary artery atherosclerosis.  Right aortic arch. 6. Enlarged pulmonary trunk 3.3 cm indicating arterial hypertension. 7. Cirrhosis and splenomegaly.  Prominent patent portal vein 1.7 cm. 8. Critical Value/emergent results were called by telephone at the time of interpretation on 03/24/2024 at 5:18 am to provider [facility] , who verbally acknowledged these results.  Shoulder XR IMPRESSION: No acute bony abnormality.   Postoperative changes in the left axilla.  No imaging for left elbow/wrist  06/03/24 EMG & NCS Examination  Impression: 1. This is an abnormal electrodiagnostic study of the left upper extremity 2. There is electrodiagnostic evidence of a diffuse, subacute brachial plexopathy affecting the upper, middle, and lower trunks of the left upper extremity with widespread denervation changes indicating ongoing axonal injury   PATIENT SURVEYS:  Patient-Specific Activity Scoring Scheme  0 represents unable to perform. 10 represents able to perform at prior level. 0 1 2 3 4 5 6 7 8 9  10 (Date and Score)   Activity Eval  06/21/24 09/14/24  1. Raising left arm  0 0  3  2. Putting on and off shirt 1  8 8   3.      4.     5.     Score 0.5/10 4/10 5.5/10   Total score = sum of the activity scores/number of activities Minimum detectable change (90%CI) for average score = 2 points Minimum detectable change (90%CI) for single activity score = 3 points     EDEMA:  Yes: moderate edema noted in left upper extremity from shoulder to hand  Observation:  2 incisions noted, one at proximal chest, one at axilla, both incisions closed with stitches and appear closed  without any drainage or signs of infection at the moment. Dressing was removed and changed for him at eval.  UPPER EXTREMITY ROM:  AROM/Passive ROM Left eval Left 04/13/24 Left 05/24/24 Left 06/21/24 Left 08/16/24 Left 09/14/24 Left 10/05/24  Shoulder flexion /50 /120 30/150 40/ 40/WFL 40/WFL   Shoulder extension         Shoulder abduction /50 /100 20/140 22/ 22/WFL 22/WFL   Shoulder adduction         Shoulder extension         Shoulder internal rotation /50    L2 behind back in standing / L1 behind back/ T12 behind back/  Shoulder external rotation /40 /70   To ear in standing/ To ear in standing/ To ear in standing/  Elbow flexion         Elbow extension         Wrist flexion         Wrist extension         Wrist ulnar deviation         Wrist radial deviation         Wrist pronation         Wrist supination          (Blank rows = not tested)   UPPER EXTREMITY MMT:  MMT Left eval Left 05/24/24 Left 06/21/24 Left 07/19/24 Left 08/16/24 Left 09/14/24 Left 10/05/24  Shoulder flexion 1 2 2 2 2 2 2   Shoulder extension         Shoulder abduction 1 2 2 2 2 2 2   Shoulder adduction         Shoulder extension         Shoulder internal rotation 1 2 4 4 4  4+ 4+  Shoulder external rotation 1 2 3  3+ 3+ 4- 4-  Middle trapezius         Lower trapezius         Elbow flexion 1 3 4 4 4 4 4   Elbow extension 2 3 4 4 4 4 4   Wrist flexion         Wrist extension         Wrist ulnar deviation         Wrist radial deviation         Wrist pronation         Wrist supination         Grip strength     3 3+    (Blank rows = not tested)                                                                                                                              TREATMENT DATE:  10/05/24 Theractivity (ROM and strength for reaching, push/pull, gripping) UBE L2.5 X 10 min , push/pull Pulleys X 15 reps flexion with slow eccentrics Standing bicep curls 3# X 20 reps Standing abduction 3# X10  (with compensations needed, he requested to use weight for this) Standing flexion 3# X10 (with compensations needed, he requested to use weight for this) Standing  chest press red X 15 Standing IR red X 15 Standing shoulder adduction red X 15 Standing shoulder row red X 15 Standing shoulder extension red X 15 Standing ER red X 15 UE Ranger 1 x 8 reps flexion and IR/ER 1 x 5 reps ea Blaze pods x 3 trials for random reach  09/22/23 Theractivity (ROM and strength for reaching, push/pull, gripping) UBE L2.5 X 10 min , push/pull Pulleys X 15 reps flexion with slow eccentrics Seated bicep curls 3# X 20 reps Standing deltoid flexion isometric pushing into green ball 5 sec X 10, and abduction isometric 5 sec X 10 Standing chest press green X 15 Standing IR red X 15 Standing shoulder adduction red X 15 Standing shoulder row red X 15 Standing shoulder extension red X 15 Standing ER red X 15 UE Ranger 1 x 8 reps flexion and IR/ER 1 x 5 reps ea Blaze pods x 3 trials for random reach    PATIENT EDUCATION: Education details: HEP, PT plan of care, selfcare Person educated: Patient Education method: Explanation, Demonstration, Verbal cues, and Handouts Education comprehension: verbalized understanding, further education recommended   HOME EXERCISE PROGRAM: Access Code: [email or web address] URL: [email or web address] Date: 03/31/2024 Prepared by: Redell Moose  Exercises - Circular Shoulder Pendulum with Table Support  - 2-3 x daily - 7 x weekly - 1 sets - 15 reps - Seated Shoulder Abduction Towel Slide at Table Top  - 2-3 x daily - 6 x weekly - 1 sets - 10 reps - 5 sec hold - Seated Shoulder Flexion Towel Slide at Table Top  - 2-3 x daily - 6 x weekly - 1 sets - 10 reps - 5 sec hold - Seated Shoulder External Rotation PROM on Table  - 2-3 x daily - 6 x weekly - 1 sets - 10 reps - 5 sec hold - Seated Gripping Towel  - 2-3 x daily - 6 x weekly - 1 sets - 20 reps - Standing Elbow Flexion  Extension AROM  - 2-3 x daily - 6 x weekly - 1 sets - 10 reps  ASSESSMENT: Recert today to extend his PT plan of care today 3 more weeks in order to continue therapy needed to address strength and improving as much function in his left shoulder as possible which is still limited by nerve damage from his injury. Some improvement noted in shoulder IR reaching behind back ROM noted.   OBJECTIVE IMPAIRMENTS: decreased activity tolerance for ADL's, difficulty walking, decreased balance, decreased endurance, decreased mobility, decreased ROM, decreased strength, impaired flexibility, impaired UE use, and pain.  ACTIVITY LIMITATIONS: bending, lifting, carry, reaching, locomotion, cleaning, community activity, driving,  PERSONAL FACTORS: see above PMH  also affecting patient's functional outcome.  REHAB POTENTIAL: Good  CLINICAL DECISION MAKING: Evolving/moderate complexity  EVALUATION COMPLEXITY: Moderate    GOALS: Short term PT Goals Target date: 04/28/2024   Pt will be I and compliant with HEP. Baseline:  Goal status: MET 04/18/24 Pt will decrease pain by 25% overall Baseline: Goal status: MET 04/18/24  Long term PT goals Target date:11/02/24  Pt will improve right shoulder and elbow AROM to [facility] to improve functional mobility Baseline: Goal status: ongoing 10/05/24 Pt will improve  right arm strength to at least 4+/5 MMT to improve functional strength Baseline: Goal status: ongoing 10/05/24 Pt will improve Patient specific functional scale (PSFS) to at least 7/10 to show improved function level Baseline: Goal status: ongoing 10/05/24 Pt will reduce pain to overall less than 3/10 with usual activity  Baseline: Goal status: ongoing 10/05/24 PLAN: PT FREQUENCY: 1-3 times per week   PT DURATION: 6-8 weeks  PLANNED INTERVENTIONS (unless contraindicated):  97110-Therapeutic exercises, 97530- Therapeutic activity, [id number]- Neuromuscular re-education, 97535- Self Care, 02859- Manual therapy,  [id number]- Electrical stimulation (unattended), 97016- Vasopneumatic device, [id number]- Ultrasound, and 97033- Ionotophoresis 4mg /ml Dexamethasone   PLAN FOR NEXT SESSION: progress strength and function as tolerated, compensatory strategies for deltoid  Redell JONELLE Moose, PT,DPT 10/05/2024, 9:02 AM   "

## 2024-10-07 ENCOUNTER — Encounter (HOSPITAL_COMMUNITY): Payer: Self-pay | Admitting: Ophthalmology

## 2024-10-07 ENCOUNTER — Ambulatory Visit (HOSPITAL_COMMUNITY)
Admission: RE | Admit: 2024-10-07 | Discharge: 2024-10-07 | Disposition: A | Attending: Ophthalmology | Admitting: Ophthalmology

## 2024-10-07 ENCOUNTER — Encounter (HOSPITAL_COMMUNITY): Admission: RE | Disposition: A | Payer: Self-pay | Source: Home / Self Care | Attending: Ophthalmology

## 2024-10-07 ENCOUNTER — Ambulatory Visit (HOSPITAL_COMMUNITY): Admitting: Anesthesiology

## 2024-10-07 ENCOUNTER — Other Ambulatory Visit: Payer: Self-pay

## 2024-10-07 LAB — GLUCOSE, CAPILLARY: Glucose-Capillary: 152 mg/dL — ABNORMAL HIGH (ref 70–99)

## 2024-10-07 MED ORDER — TETRACAINE HCL 0.5 % OP SOLN
1.0000 [drp] | OPHTHALMIC | Status: AC | PRN
  Administered 2024-10-07 (×3): 1 [drp] via OPHTHALMIC

## 2024-10-07 MED ORDER — SODIUM HYALURONATE 10 MG/ML IO SOLUTION
PREFILLED_SYRINGE | INTRAOCULAR | Status: DC | PRN
  Administered 2024-10-07: .85 mL via INTRAOCULAR

## 2024-10-07 MED ORDER — MOXIFLOXACIN HCL 5 MG/ML IO SOLN
INTRAOCULAR | Status: DC | PRN
  Administered 2024-10-07: .2 mL via INTRACAMERAL

## 2024-10-07 MED ORDER — LACTATED RINGERS IV SOLN: INTRAVENOUS | Status: DC

## 2024-10-07 MED ORDER — BSS IO SOLN
INTRAOCULAR | Status: DC | PRN
  Administered 2024-10-07: 15 mL via INTRAOCULAR

## 2024-10-07 MED ORDER — STERILE WATER FOR IRRIGATION IR SOLN
Status: DC | PRN
  Administered 2024-10-07: 1

## 2024-10-07 MED ORDER — SODIUM CHLORIDE 0.9% FLUSH
INTRAVENOUS | Status: DC | PRN
  Administered 2024-10-07: 5 mL via INTRAVENOUS

## 2024-10-07 MED ORDER — MIDAZOLAM HCL (PF) 2 MG/2ML IJ SOLN
INTRAMUSCULAR | Status: DC | PRN
  Administered 2024-10-07: 1 mg via INTRAVENOUS

## 2024-10-07 MED ORDER — PHENYLEPHRINE-KETOROLAC 1-0.3 % IO SOLN
INTRAOCULAR | Status: DC | PRN
  Administered 2024-10-07: 500 mL via OPHTHALMIC

## 2024-10-07 MED ORDER — SODIUM HYALURONATE 23MG/ML IO SOSY
PREFILLED_SYRINGE | INTRAOCULAR | Status: DC | PRN
  Administered 2024-10-07: .6 mL via INTRAOCULAR

## 2024-10-07 MED ORDER — POVIDONE-IODINE 5 % OP SOLN
OPHTHALMIC | Status: DC | PRN
  Administered 2024-10-07: 1 via OPHTHALMIC

## 2024-10-07 MED ORDER — PHENYLEPHRINE HCL 2.5 % OP SOLN
1.0000 [drp] | OPHTHALMIC | Status: AC | PRN
  Administered 2024-10-07 (×3): 1 [drp] via OPHTHALMIC

## 2024-10-07 MED ORDER — LIDOCAINE HCL 3.5 % OP GEL
1.0000 | Freq: Once | OPHTHALMIC | Status: AC
  Administered 2024-10-07: 1 via OPHTHALMIC

## 2024-10-07 MED ORDER — TROPICAMIDE 1 % OP SOLN
1.0000 [drp] | OPHTHALMIC | Status: AC | PRN
  Administered 2024-10-07 (×3): 1 [drp] via OPHTHALMIC

## 2024-10-07 MED ORDER — MIDAZOLAM HCL 2 MG/2ML IJ SOLN
INTRAMUSCULAR | Status: AC
  Filled 2024-10-07: qty 2

## 2024-10-07 MED ORDER — LIDOCAINE HCL (PF) 1 % IJ SOLN
INTRAMUSCULAR | Status: DC | PRN
  Administered 2024-10-07: 1 mL

## 2024-10-07 NOTE — Transfer of Care (Signed)
 Immediate Anesthesia Transfer of Care Note  Patient: Randy Hebert  Procedure(s) Performed: PHACOEMULSIFICATION, CATARACT, WITH IOL INSERTION (Left: Eye)  Patient Location: Endoscopy Unit  Anesthesia Type:MAC  Level of Consciousness: awake and patient cooperative  Airway & Oxygen  Therapy: Patient Spontanous Breathing  Post-op Assessment: Report given to RN and Post -op Vital signs reviewed and stable  Post vital signs: Reviewed and stable  Last Vitals:  Vitals Value Taken Time  BP 123/81 10/07/24 10:56  Temp 36.7 C 10/07/24 10:56  Pulse 74 10/07/24 10:56  Resp 21 10/07/24 10:56  SpO2 93 % 10/07/24 10:56    Last Pain:  Vitals:   10/07/24 1056  TempSrc: Oral  PainSc: 0-No pain         Complications: No notable events documented.

## 2024-10-07 NOTE — Anesthesia Procedure Notes (Signed)
 Date/Time: 10/07/2024 10:35 AM  Performed by: Barbarann Verneita RAMAN, CRNAPre-anesthesia Checklist: Patient identified, Emergency Drugs available, Suction available, Timeout performed and Patient being monitored Patient Re-evaluated:Patient Re-evaluated prior to induction Oxygen  Delivery Method: Nasal Cannula

## 2024-10-07 NOTE — Anesthesia Postprocedure Evaluation (Signed)
"   Anesthesia Post Note  Patient: Randy Hebert  Procedure(s) Performed: PHACOEMULSIFICATION, CATARACT, WITH IOL INSERTION (Left: Eye)  Patient location during evaluation: Phase II Anesthesia Type: MAC Level of consciousness: awake Pain management: pain level controlled Vital Signs Assessment: post-procedure vital signs reviewed and stable Respiratory status: spontaneous breathing and respiratory function stable Cardiovascular status: blood pressure returned to baseline and stable Postop Assessment: no headache and no apparent nausea or vomiting Anesthetic complications: no Comments: Late entry   No notable events documented.   Last Vitals:  Vitals:   10/07/24 0950 10/07/24 1056  BP: 119/72 123/81  Pulse: 74 74  Resp: (!) 22 (!) 21  Temp: 36.7 C 36.7 C  SpO2: 93% 93%    Last Pain:  Vitals:   10/07/24 1056  TempSrc: Oral  PainSc: 0-No pain                 Yvonna PARAS Graycee Greeson      "

## 2024-10-07 NOTE — Interval H&P Note (Signed)
 History and Physical Interval Note:  10/07/2024 10:26 AM  Randy Hebert  has presented today for surgery, with the diagnosis of combined forms age related cataract, left eye.  The various methods of treatment have been discussed with the patient and family. After consideration of risks, benefits and other options for treatment, the patient has consented to  Procedures with comments: PHACOEMULSIFICATION, CATARACT, WITH IOL INSERTION (Left) - CDE: as a surgical intervention.  The patient's history has been reviewed, patient examined, no change in status, stable for surgery.  I have reviewed the patient's chart and labs.  Questions were answered to the patient's satisfaction.     HARRIE AGENT

## 2024-10-07 NOTE — Anesthesia Preprocedure Evaluation (Signed)
"                                    Anesthesia Evaluation  Patient identified by MRN, date of birth, ID band Patient awake    Reviewed: Allergy  & Precautions, H&P , NPO status , Patient's Chart, lab work & pertinent test results, reviewed documented beta blocker date and time   History of Anesthesia Complications (+) DIFFICULT AIRWAY and history of anesthetic complications  Airway Mallampati: II  TM Distance: >3 FB Neck ROM: full    Dental no notable dental hx.    Pulmonary shortness of breath, asthma , sleep apnea , pneumonia, COPD, Current Smoker   Pulmonary exam normal breath sounds clear to auscultation       Cardiovascular Exercise Tolerance: Good hypertension, + CAD, + Past MI and +CHF  + Valvular Problems/Murmurs  Rhythm:regular Rate:Normal     Neuro/Psych  Headaches PSYCHIATRIC DISORDERS Anxiety Depression    CVA    GI/Hepatic Neg liver ROS,GERD  ,,  Endo/Other  diabetes    Renal/GU negative Renal ROS  negative genitourinary   Musculoskeletal   Abdominal   Peds  Hematology  (+) Blood dyscrasia, anemia   Anesthesia Other Findings   Reproductive/Obstetrics negative OB ROS                              Anesthesia Physical Anesthesia Plan  ASA: 3  Anesthesia Plan: MAC   Post-op Pain Management:    Induction:   PONV Risk Score and Plan:   Airway Management Planned:   Additional Equipment:   Intra-op Plan:   Post-operative Plan:   Informed Consent: I have reviewed the patients History and Physical, chart, labs and discussed the procedure including the risks, benefits and alternatives for the proposed anesthesia with the patient or authorized representative who has indicated his/her understanding and acceptance.     Dental Advisory Given  Plan Discussed with: CRNA  Anesthesia Plan Comments:         Anesthesia Quick Evaluation  "

## 2024-10-07 NOTE — Discharge Instructions (Addendum)
 Please discharge patient when stable, will follow up today with Dr. June Leap at the [facility] office immediately following discharge.  Leave shield in place until visit.  All paperwork with discharge instructions will be given at the office.  [facility] Address:  [address]  [place], [place] [address]

## 2024-10-07 NOTE — Op Note (Signed)
 Date of procedure: 10/07/24  Pre-operative diagnosis: Visually significant age-related combined cataract, Left Eye (H25.812)  Post-operative diagnosis: Visually significant age-related combined cataract, Left Eye (H25.812)  Procedure: Removal of cataract via phacoemulsification and insertion of intra-ocular lens Vicci and Johnson DIB00 +21.5D into the capsular bag of the Left Eye  Attending surgeon: Lynwood LABOR. Keyunna Coco, MD, MA  Anesthesia: MAC, Topical Akten   Complications: None  Estimated Blood Loss: <72mL (minimal)  Specimens: None  Implants: As above  Indications:  Visually significant age-related cataract, Left Eye  Procedure:  The patient was seen and identified in the pre-operative area. The operative eye was identified and dilated.  The operative eye was marked.  Topical anesthesia was administered to the operative eye.     The patient was then to the operative suite and placed in the supine position.  A timeout was performed confirming the patient, procedure to be performed, and all other relevant information.   The patient's face was prepped and draped in the usual fashion for intra-ocular surgery.  A lid speculum was placed into the operative eye and the surgical microscope moved into place and focused.  An inferotemporal paracentesis was created using a 20 gauge paracentesis blade. Omidria  was injected into the anterior chamber. Shugarcaine was injected into the anterior chamber.  Viscoelastic was injected into the anterior chamber.  A temporal clear-corneal main wound incision was created using a 2.37mm microkeratome.  A continuous curvilinear capsulorrhexis was initiated using an irrigating cystitome and completed using capsulorrhexis forceps.  Hydrodissection and hydrodeliniation were performed.  Viscoelastic was injected into the anterior chamber.  A phacoemulsification handpiece and a chopper as a second instrument were used to remove the nucleus and epinucleus. The  irrigation/aspiration handpiece was used to remove any remaining cortical material.   The capsular bag was reinflated with viscoelastic, checked, and found to be intact.  The intraocular lens was inserted into the capsular bag.  The irrigation/aspiration handpiece was used to remove any remaining viscoelastic.  The clear corneal wound and paracentesis wounds were then hydrated and checked with Weck-Cels to be watertight. 0.1mL of Moxfloxacin was injected into the anterior chamber. The lid-speculum was removed.  The drape was removed.  The patient's face was cleaned with a wet and dry 4x4.    A clear shield was taped over the eye. The patient was taken to the post-operative care unit in good condition, having tolerated the procedure well.  Post-Op Instructions: The patient will follow up at [facility] for a same day post-operative evaluation and will receive all other orders and instructions.

## 2024-10-11 ENCOUNTER — Ambulatory Visit

## 2024-10-18 ENCOUNTER — Encounter (HOSPITAL_COMMUNITY)

## 2024-10-18 ENCOUNTER — Ambulatory Visit

## 2024-10-21 ENCOUNTER — Ambulatory Visit (HOSPITAL_COMMUNITY): Admit: 2024-10-21 | Admitting: Ophthalmology

## 2024-10-27 ENCOUNTER — Ambulatory Visit: Admitting: Physical Therapy

## 2024-11-16 ENCOUNTER — Ambulatory Visit

## 2024-11-24 ENCOUNTER — Ambulatory Visit: Admitting: Physical Therapy

## 2024-12-07 ENCOUNTER — Ambulatory Visit: Admitting: "Endocrinology

## 2024-12-27 ENCOUNTER — Ambulatory Visit

## 2024-12-27 ENCOUNTER — Ambulatory Visit (HOSPITAL_COMMUNITY)

## 2025-01-13 ENCOUNTER — Encounter
